# Patient Record
Sex: Male | Born: 1960
Health system: Southern US, Community
[De-identification: ages and names within clinical notes are randomized; demographics above are authoritative.]

## PROBLEM LIST (undated history)

## (undated) DIAGNOSIS — R7881 Bacteremia: Secondary | ICD-10-CM

## (undated) DIAGNOSIS — Z8489 Family history of other specified conditions: Secondary | ICD-10-CM

## (undated) DIAGNOSIS — F419 Anxiety disorder, unspecified: Secondary | ICD-10-CM

## (undated) DIAGNOSIS — R0600 Dyspnea, unspecified: Secondary | ICD-10-CM

## (undated) DIAGNOSIS — F32A Depression, unspecified: Secondary | ICD-10-CM

## (undated) DIAGNOSIS — A0472 Enterocolitis due to Clostridium difficile, not specified as recurrent: Secondary | ICD-10-CM

## (undated) DIAGNOSIS — B965 Pseudomonas (aeruginosa) (mallei) (pseudomallei) as the cause of diseases classified elsewhere: Secondary | ICD-10-CM

## (undated) DIAGNOSIS — Z923 Personal history of irradiation: Secondary | ICD-10-CM

## (undated) DIAGNOSIS — I011 Acute rheumatic endocarditis: Secondary | ICD-10-CM

## (undated) DIAGNOSIS — I480 Paroxysmal atrial fibrillation: Secondary | ICD-10-CM

## (undated) DIAGNOSIS — M1990 Unspecified osteoarthritis, unspecified site: Secondary | ICD-10-CM

## (undated) DIAGNOSIS — K219 Gastro-esophageal reflux disease without esophagitis: Secondary | ICD-10-CM

## (undated) DIAGNOSIS — C3490 Malignant neoplasm of unspecified part of unspecified bronchus or lung: Secondary | ICD-10-CM

## (undated) HISTORY — PX: HIP SURGERY: SHX245

## (undated) HISTORY — DX: Enterocolitis due to Clostridium difficile, not specified as recurrent: A04.72

## (undated) HISTORY — DX: Bacteremia: R78.81

## (undated) HISTORY — DX: Pseudomonas (aeruginosa) (mallei) (pseudomallei) as the cause of diseases classified elsewhere: B96.5

## (undated) HISTORY — DX: Paroxysmal atrial fibrillation: I48.0

---

## 2014-02-15 HISTORY — PX: CATARACT EXTRACTION: SUR2

## 2014-08-12 DIAGNOSIS — H2512 Age-related nuclear cataract, left eye: Secondary | ICD-10-CM | POA: Insufficient documentation

## 2014-11-08 DIAGNOSIS — F419 Anxiety disorder, unspecified: Secondary | ICD-10-CM

## 2014-11-08 DIAGNOSIS — Z8739 Personal history of other diseases of the musculoskeletal system and connective tissue: Secondary | ICD-10-CM | POA: Insufficient documentation

## 2014-11-08 DIAGNOSIS — G40A01 Absence epileptic syndrome, not intractable, with status epilepticus: Secondary | ICD-10-CM | POA: Insufficient documentation

## 2014-11-08 HISTORY — DX: Anxiety disorder, unspecified: F41.9

## 2014-11-19 DIAGNOSIS — Z961 Presence of intraocular lens: Secondary | ICD-10-CM | POA: Insufficient documentation

## 2016-12-29 ENCOUNTER — Other Ambulatory Visit: Payer: Self-pay | Admitting: Physician Assistant

## 2016-12-29 DIAGNOSIS — Z7952 Long term (current) use of systemic steroids: Secondary | ICD-10-CM

## 2018-02-15 DIAGNOSIS — U071 COVID-19: Secondary | ICD-10-CM

## 2018-02-15 HISTORY — DX: COVID-19: U07.1

## 2020-03-24 ENCOUNTER — Encounter: Payer: Self-pay | Admitting: Emergency Medicine

## 2020-03-24 ENCOUNTER — Telehealth: Payer: Self-pay | Admitting: Emergency Medicine

## 2020-03-24 ENCOUNTER — Other Ambulatory Visit (INDEPENDENT_AMBULATORY_CARE_PROVIDER_SITE_OTHER): Payer: 59

## 2020-03-24 ENCOUNTER — Ambulatory Visit: Payer: 59 | Admitting: Emergency Medicine

## 2020-03-24 ENCOUNTER — Institutional Professional Consult (permissible substitution): Payer: 59 | Admitting: Emergency Medicine

## 2020-03-24 ENCOUNTER — Other Ambulatory Visit: Payer: Self-pay

## 2020-03-24 DIAGNOSIS — J449 Chronic obstructive pulmonary disease, unspecified: Secondary | ICD-10-CM | POA: Insufficient documentation

## 2020-03-24 DIAGNOSIS — R0602 Shortness of breath: Secondary | ICD-10-CM | POA: Diagnosis not present

## 2020-03-24 DIAGNOSIS — R0600 Dyspnea, unspecified: Secondary | ICD-10-CM | POA: Insufficient documentation

## 2020-03-24 DIAGNOSIS — R918 Other nonspecific abnormal finding of lung field: Secondary | ICD-10-CM | POA: Diagnosis not present

## 2020-03-24 LAB — CBC WITH DIFFERENTIAL/PLATELET
Basophils Absolute: 0 10*3/uL (ref 0.0–0.1)
Basophils Relative: 1 % (ref 0.0–3.0)
Eosinophils Absolute: 0.4 10*3/uL (ref 0.0–0.7)
Eosinophils Relative: 9.3 % — ABNORMAL HIGH (ref 0.0–5.0)
HCT: 35 % — ABNORMAL LOW (ref 39.0–52.0)
Hemoglobin: 11.6 g/dL — ABNORMAL LOW (ref 13.0–17.0)
Lymphocytes Relative: 33.1 % (ref 12.0–46.0)
Lymphs Abs: 1.3 10*3/uL (ref 0.7–4.0)
MCHC: 33.3 g/dL (ref 30.0–36.0)
MCV: 82 fl (ref 78.0–100.0)
Monocytes Absolute: 0.7 10*3/uL (ref 0.1–1.0)
Monocytes Relative: 17.4 % — ABNORMAL HIGH (ref 3.0–12.0)
Neutro Abs: 1.6 10*3/uL (ref 1.4–7.7)
Neutrophils Relative %: 39.2 % — ABNORMAL LOW (ref 43.0–77.0)
Platelets: 293 10*3/uL (ref 150.0–400.0)
RBC: 4.26 Mil/uL (ref 4.22–5.81)
RDW: 15.8 % — ABNORMAL HIGH (ref 11.5–15.5)
WBC: 4 10*3/uL (ref 4.0–10.5)

## 2020-03-24 LAB — BASIC METABOLIC PANEL
BUN: 8 mg/dL (ref 6–23)
CO2: 28 mEq/L (ref 19–32)
Calcium: 8.8 mg/dL (ref 8.4–10.5)
Chloride: 100 mEq/L (ref 96–112)
Creatinine, Ser: 0.82 mg/dL (ref 0.40–1.50)
GFR: 96.75 mL/min (ref >60.00)
Glucose, Bld: 97 mg/dL (ref 70–99)
Potassium: 3.7 mEq/L (ref 3.5–5.1)
Sodium: 135 mEq/L (ref 135–145)

## 2020-03-24 LAB — PROTIME-INR
INR: 1.2 ratio — ABNORMAL HIGH (ref 0.8–1.0)
Prothrombin Time: 13.1 s (ref 9.6–13.1)

## 2020-03-24 NOTE — Telephone Encounter (Signed)
Spoke with [facility] and images will be pushed through on PACs. Dr. [name] notified. Nothing further needed at this time.

## 2020-03-24 NOTE — Telephone Encounter (Signed)
LVM for [place] in reference to disk copy of CT sent to Dr. [name]. If call is returned please obtain information on how to get disk sent to office. Will leave encounter open for follow up

## 2020-03-24 NOTE — Assessment & Plan Note (Signed)
7.2 cm right hilar mass with endobronchial involvement.  He had hemoptysis, dyspnea.  Probable primary lung cancer.  He needs bronchoscopy as soon as possible we will arrange for 03/27/2020.  He understands the plans.  Lab work today.  I will obtain copies of his imaging from [facility] in [place]

## 2020-03-24 NOTE — Assessment & Plan Note (Signed)
Likely due to COPD plus the right hilar mass.  He needs pulmonary function testing, probably bronchodilator therapy.  We will arrange for PFT.

## 2020-03-24 NOTE — H&P (View-Only) (Signed)
Subjective:    Patient ID: John [name], male    DOB: 05-24-1960, 60 y.o.   MRN: [id number]  HPI 60 yo former smoker (30 pack years) with a history of rheumatoid arthritis followed [facility] rheumatology and on Simponi, prednisone 5 mg daily, Arava. Hx of seizures in the past, formerly on dilantin, off this since his 27's.  He had COVID end of 2021, resulted in SOB. He is here to evaluate an abnormal CT chest.  He developed cough and hemoptysis beginning about 2.5 weeks ago.  He has slowly progressive dyspnea over a few years. He exerts, works on hoarse farm, having more trouble doing chores.   CXR was performed and then CT chest done on 03/11/2020.  I have the report available but not the films.  Note was made of a 7.2 cm mass contiguous with the right hilum and along the right bronchial tree lobulated and irregular with some associated cavitation.  There are numerous satellite nodules present.  There is occlusion of right upper lobe segmental bronchi consistent with endobronchial involvement.  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 Marital status: Married    Spouse name: Not on file  . Number of children: Not on file  . Years of education: Not on file  . Highest education level: Not on file  Occupational History  . Not on file  Tobacco Use  . Smoking status: Former Smoker    Packs/day: 1.00    Years: 30.00    Pack years: 30.00    Types: Cigarettes    Quit date: 2018    Years since quitting: 4.1  . Smokeless tobacco: Never Used  Substance and Sexual Activity  . Alcohol use: Not on file  . Drug use: Not on file  . Sexual activity: Not on file  Other Topics Concern  . Not on file  Social History  Narrative  . 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    Not on File   Outpatient Medications Prior to Visit  Medication Sig Dispense Refill  . leflunomide (ARAVA) 20 MG tablet Take 20 mg by mouth daily.    [name] PARoxetine (PAXIL) 10 MG tablet Take by mouth.    [name] PARoxetine (PAXIL) 10 MG tablet Take 10 mg by mouth daily.    [name] SIMPONI 50 MG/0.5ML SOAJ Inject into the skin.     No facility-administered medications prior to visit.         Objective:   Physical Exam Vitals:   03/24/20 1356  BP: 118/74  Pulse: 100  Temp: (!) 97.2 F (36.2 C)  SpO2: 98%  Weight: 238 lb (108 kg)  Height: 5\' 9"  (1.753 m)   Gen: Pleasant, well-nourished, in no distress,  normal affect  ENT: No lesions,  mouth clear,  oropharynx clear, no postnasal drip  Neck: No JVD, no stridor  Lungs: No use of accessory muscles, no crackles or wheezing on normal respiration, no wheeze on forced expiration  Cardiovascular: RRR, heart sounds normal, no murmur or gallops, no peripheral edema  Musculoskeletal: No deformities, no cyanosis or clubbing  Neuro: alert, awake, non focal  Skin: Warm, no lesions or rash      Assessment & Plan:  Mass of right lung 7.2 cm right hilar mass with endobronchial involvement.  He had hemoptysis, dyspnea.  Probable primary lung cancer.  He needs bronchoscopy as soon as possible we will arrange for 03/27/2020.  He understands the plans.  Lab work today.  I will obtain copies of his imaging from [facility] in [facility]  Dyspnea Likely due to COPD plus the right hilar mass.  He needs pulmonary function testing, probably  bronchodilator therapy.  We will arrange for PFT.  Baltazar Apo, MD, PhD 03/24/2020, 2:37 PM [place] [facility] [phone] or if no answer before 7:00PM call [phone] For any issues after 7:00PM please call eLink [phone]

## 2020-03-24 NOTE — Patient Instructions (Addendum)
We will arrange for bronchoscopy on 03/27/2020 at [facility].  You will be contacted with the specific instructions regarding the test.  You need a designated driver. Lab work today.  We will plan to perform pulmonary function testing at your next office visit to better evaluate your shortness of breath. Follow with Dr. [name] next available with full pulmonary function testing on the same day.

## 2020-03-24 NOTE — Progress Notes (Signed)
Subjective:    Patient ID: John [name], male    DOB: [birth year], 60 y.o.   MRN: [id number]  HPI 60 yo former smoker (30 pack years) with a history of rheumatoid arthritis followed [facility] rheumatology and on Simponi, prednisone 5 mg daily, Arava. Hx of seizures in the past, formerly on dilantin, off this since his 60's.  He had COVID end of 2021, resulted in SOB. He is here to evaluate an abnormal CT chest.  He developed cough and hemoptysis beginning about 2.5 weeks ago.  He has slowly progressive dyspnea over a few years. He exerts, works on hoarse farm, having more trouble doing chores.   CXR was performed and then CT chest done on 03/11/2020.  I have the report available but not the films.  Note was made of a 7.2 cm mass contiguous with the right hilum and along the right bronchial tree lobulated and irregular with some associated cavitation.  There are numerous satellite nodules present.  There is occlusion of right upper lobe segmental bronchi consistent with endobronchial involvement.  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 Marital status: Married    Spouse name: Not on file  . Number of children: Not on file  . Years of education: Not on file  . Highest education level: Not on file  Occupational History  . Not on file  Tobacco Use  . Smoking status: Former Smoker    Packs/day: 1.00    Years: 30.00    Pack years: 30.00    Types: Cigarettes    Quit date: 2018    Years since quitting: 4.1  . Smokeless tobacco: Never Used  Substance and Sexual Activity  . Alcohol use: Not on file  . Drug use: Not on file  . Sexual activity: Not on file  Other Topics Concern  . Not on file  Social History  Narrative  . 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    Not on File   Outpatient Medications Prior to Visit  Medication Sig Dispense Refill  . leflunomide (ARAVA) 20 MG tablet Take 20 mg by mouth daily.    [name] PARoxetine (PAXIL) 10 MG tablet Take by mouth.    [name] PARoxetine (PAXIL) 10 MG tablet Take 10 mg by mouth daily.    [name] SIMPONI 50 MG/0.5ML SOAJ Inject into the skin.     No facility-administered medications prior to visit.         Objective:   Physical Exam Vitals:   03/24/20 1356  BP: 118/74  Pulse: 100  Temp: (!) 97.2 F (36.2 C)  SpO2: 98%  Weight: 238 lb (108 kg)  Height: 5\' 9"  (1.753 m)   Gen: Pleasant, well-nourished, in no distress,  normal affect  ENT: No lesions,  mouth clear,  oropharynx clear, no postnasal drip  Neck: No JVD, no stridor  Lungs: No use of accessory muscles, no crackles or wheezing on normal respiration, no wheeze on forced expiration  Cardiovascular: RRR, heart sounds normal, no murmur or gallops, no peripheral edema  Musculoskeletal: No deformities, no cyanosis or clubbing  Neuro: alert, awake, non focal  Skin: Warm, no lesions or rash      Assessment & Plan:  Mass of right lung 7.2 cm right hilar mass with endobronchial involvement.  He had hemoptysis, dyspnea.  Probable primary lung cancer.  He needs bronchoscopy as soon as possible we will arrange for 03/27/2020.  He understands the plans.  Lab work today.  I will obtain copies of his imaging from [facility] in [facility]  Dyspnea Likely due to COPD plus the right hilar mass.  He needs pulmonary function testing, probably  bronchodilator therapy.  We will arrange for PFT.  Baltazar Apo, MD, PhD 03/24/2020, 2:37 PM [place] [facility] [phone] or if no answer before 7:00PM call [phone] For any issues after 7:00PM please call eLink [phone]

## 2020-03-25 ENCOUNTER — Encounter (HOSPITAL_COMMUNITY): Payer: Self-pay | Admitting: Emergency Medicine

## 2020-03-25 ENCOUNTER — Telehealth: Payer: Self-pay | Admitting: Emergency Medicine

## 2020-03-25 ENCOUNTER — Other Ambulatory Visit (HOSPITAL_COMMUNITY)
Admission: RE | Admit: 2020-03-25 | Discharge: 2020-03-25 | Disposition: A | Discharge status: Home / Self Care | Payer: 59 | Source: Ambulatory Visit | Attending: Emergency Medicine | Admitting: Emergency Medicine

## 2020-03-25 DIAGNOSIS — Z01812 Encounter for preprocedural laboratory examination: Secondary | ICD-10-CM | POA: Diagnosis present

## 2020-03-25 DIAGNOSIS — Z20822 Contact with and (suspected) exposure to covid-19: Secondary | ICD-10-CM | POA: Insufficient documentation

## 2020-03-25 LAB — SARS CORONAVIRUS 2 (TAT 6-24 HRS): SARS Coronavirus 2: NEGATIVE

## 2020-03-25 NOTE — Telephone Encounter (Signed)
Yesterday, I contacted the patient & scheduled a covid test for today, 03/25/20 at 8:55 am for the EBUS scheduled by Dr. [name] for 2/10.  I asked [facility] to obtain a PA if needed.

## 2020-03-25 NOTE — Progress Notes (Signed)
EKG: N/A CXR: N/A ECHO: denies Stress Test: denies Cardiac Cath: denies  Fasting Blood Sugar- n/a Checks Blood Sugar__na/_ times a day  OSA/CPAP:  No  ASA/Blood Thinners:  No  Covid test 03/25/20 negative  Anesthesia Review:  No  Patient denies shortness of breath, fever, cough, and chest pain at PAT appointment.  Patient verbalized understanding of instructions provided today at the PAT appointment.  Patient asked to review instructions at home and day of surgery.

## 2020-03-27 ENCOUNTER — Encounter (HOSPITAL_COMMUNITY)
Admission: RE | Disposition: A | Discharge status: Home / Self Care | Payer: Self-pay | Source: Home / Self Care | Attending: Emergency Medicine

## 2020-03-27 ENCOUNTER — Other Ambulatory Visit: Payer: Self-pay | Admitting: Emergency Medicine

## 2020-03-27 ENCOUNTER — Ambulatory Visit (HOSPITAL_COMMUNITY)
Admission: RE | Admit: 2020-03-27 | Discharge: 2020-03-27 | Disposition: A | Discharge status: Home / Self Care | Payer: 59 | Attending: Emergency Medicine | Admitting: Emergency Medicine

## 2020-03-27 ENCOUNTER — Other Ambulatory Visit: Payer: Self-pay

## 2020-03-27 ENCOUNTER — Encounter (HOSPITAL_COMMUNITY): Payer: Self-pay | Admitting: Emergency Medicine

## 2020-03-27 ENCOUNTER — Ambulatory Visit (HOSPITAL_COMMUNITY): Payer: 59 | Admitting: Anesthesiology

## 2020-03-27 DIAGNOSIS — C3411 Malignant neoplasm of upper lobe, right bronchus or lung: Secondary | ICD-10-CM | POA: Insufficient documentation

## 2020-03-27 DIAGNOSIS — Z87891 Personal history of nicotine dependence: Secondary | ICD-10-CM | POA: Insufficient documentation

## 2020-03-27 DIAGNOSIS — R918 Other nonspecific abnormal finding of lung field: Secondary | ICD-10-CM | POA: Diagnosis present

## 2020-03-27 DIAGNOSIS — M069 Rheumatoid arthritis, unspecified: Secondary | ICD-10-CM | POA: Diagnosis not present

## 2020-03-27 DIAGNOSIS — Z79899 Other long term (current) drug therapy: Secondary | ICD-10-CM | POA: Diagnosis not present

## 2020-03-27 DIAGNOSIS — Z8249 Family history of ischemic heart disease and other diseases of the circulatory system: Secondary | ICD-10-CM | POA: Insufficient documentation

## 2020-03-27 DIAGNOSIS — Z8616 Personal history of COVID-19: Secondary | ICD-10-CM | POA: Diagnosis not present

## 2020-03-27 HISTORY — PX: VIDEO BRONCHOSCOPY WITH ENDOBRONCHIAL ULTRASOUND: SHX6177

## 2020-03-27 HISTORY — PX: BRONCHIAL BRUSHINGS: SHX5108

## 2020-03-27 HISTORY — DX: Family history of other specified conditions: Z84.89

## 2020-03-27 HISTORY — DX: Gastro-esophageal reflux disease without esophagitis: K21.9

## 2020-03-27 HISTORY — PX: FINE NEEDLE ASPIRATION: SHX6590

## 2020-03-27 HISTORY — DX: Dyspnea, unspecified: R06.00

## 2020-03-27 SURGERY — BRONCHOSCOPY, WITH EBUS
Anesthesia: General

## 2020-03-27 MED ORDER — CHLORHEXIDINE GLUCONATE 0.12 % MT SOLN
15.0000 mL | Freq: Once | OROMUCOSAL | Status: AC
  Filled 2020-03-27: qty 15

## 2020-03-27 MED ORDER — EPINEPHRINE 1 MG/10ML IJ SOSY
PREFILLED_SYRINGE | INTRAMUSCULAR | Status: AC
  Filled 2020-03-27: qty 10

## 2020-03-27 MED ORDER — ONDANSETRON HCL 4 MG/2ML IJ SOLN
INTRAMUSCULAR | Status: DC | PRN
  Administered 2020-03-27: 4 mg via INTRAVENOUS

## 2020-03-27 MED ORDER — SUGAMMADEX SODIUM 200 MG/2ML IV SOLN
INTRAVENOUS | Status: DC | PRN
  Administered 2020-03-27: 400 mg via INTRAVENOUS

## 2020-03-27 MED ORDER — LACTATED RINGERS IV SOLN: INTRAVENOUS | Status: DC

## 2020-03-27 MED ORDER — CHLORHEXIDINE GLUCONATE 0.12 % MT SOLN
OROMUCOSAL | Status: AC
  Administered 2020-03-27: 15 mL via OROMUCOSAL
  Filled 2020-03-27: qty 15

## 2020-03-27 MED ORDER — PROPOFOL 10 MG/ML IV BOLUS
INTRAVENOUS | Status: DC | PRN
  Administered 2020-03-27: 200 mg via INTRAVENOUS

## 2020-03-27 MED ORDER — ESMOLOL HCL 100 MG/10ML IV SOLN
INTRAVENOUS | Status: DC | PRN
  Administered 2020-03-27: 30 mg via INTRAVENOUS
  Administered 2020-03-27: 40 mg via INTRAVENOUS
  Administered 2020-03-27: 30 mg via INTRAVENOUS

## 2020-03-27 MED ORDER — ROCURONIUM BROMIDE 10 MG/ML (PF) SYRINGE
PREFILLED_SYRINGE | INTRAVENOUS | Status: DC | PRN
  Administered 2020-03-27: 100 mg via INTRAVENOUS

## 2020-03-27 MED ORDER — PHENYLEPHRINE HCL (PRESSORS) 10 MG/ML IV SOLN
INTRAVENOUS | Status: DC | PRN
  Administered 2020-03-27: 120 ug via INTRAVENOUS
  Administered 2020-03-27: 100 ug via INTRAVENOUS

## 2020-03-27 MED ORDER — SODIUM CHLORIDE (PF) 0.9 % IJ SOLN
PREFILLED_SYRINGE | INTRAMUSCULAR | Status: DC | PRN
  Administered 2020-03-27: 8 mL

## 2020-03-27 MED ORDER — LIDOCAINE 2% (20 MG/ML) 5 ML SYRINGE
INTRAMUSCULAR | Status: DC | PRN
  Administered 2020-03-27: 60 mg via INTRAVENOUS

## 2020-03-27 MED ORDER — DEXAMETHASONE SODIUM PHOSPHATE 10 MG/ML IJ SOLN
INTRAMUSCULAR | Status: DC | PRN
  Administered 2020-03-27: 10 mg via INTRAVENOUS

## 2020-03-27 MED ORDER — SODIUM CHLORIDE (PF) 0.9 % IJ SOLN: PREFILLED_SYRINGE | INTRAMUSCULAR | Status: DC | PRN

## 2020-03-27 MED ORDER — FENTANYL CITRATE (PF) 250 MCG/5ML IJ SOLN
INTRAMUSCULAR | Status: DC | PRN
  Administered 2020-03-27 (×2): 100 ug via INTRAVENOUS

## 2020-03-27 NOTE — Transfer of Care (Signed)
Immediate Anesthesia Transfer of Care Note  Patient: John [name]  Procedure(s) Performed: VIDEO BRONCHOSCOPY WITH ENDOBRONCHIAL ULTRASOUND (N/A ) FINE NEEDLE ASPIRATION BRONCHIAL BRUSHINGS  Patient Location: Endoscopy Unit  Anesthesia Type:General  Level of Consciousness: awake, alert  and oriented  Airway & Oxygen Therapy: Patient Spontanous Breathing and Patient connected to nasal cannula oxygen  Post-op Assessment: Report given to RN and Post -op Vital signs reviewed and stable  Post vital signs: Reviewed and stable  Last Vitals:  Vitals Value Taken Time  BP 151/82 03/27/20 1514  Temp    Pulse 107 03/27/20 1515  Resp 15 03/27/20 1515  SpO2 96 % 03/27/20 1515  Vitals shown include unvalidated device data.  Last Pain:  Vitals:   03/27/20 1513  TempSrc:   PainSc: 0-No pain      Patients Stated Pain Goal: 4 (35/59/74 1638)  Complications: No complications documented.

## 2020-03-27 NOTE — Op Note (Signed)
Video Bronchoscopy with Endobronchial Ultrasound Procedure Note  Date of Operation: 03/27/2020  Pre-op Diagnosis: Right upper lobe mass  Post-op Diagnosis: Same  Surgeon: Baltazar Apo  Assistants: None  Anesthesia: General endotracheal anesthesia  Operation: Flexible video fiberoptic bronchoscopy with endobronchial ultrasound and biopsies.  Estimated Blood Loss: 25 cc  Complications: None apparent  Indications and History: John [name] is a 60 y.o. male with history of tobacco use, RA on immunosuppression, seizures.  He had COVID in 2021.  CT chest to evaluate revealed a right hilar mass.  He presents today for tissue diagnosis, plan for bronchoscopy with EBUS.  The risks, benefits, complications, treatment options and expected outcomes were discussed with the patient.  The possibilities of pneumothorax, pneumonia, reaction to medication, pulmonary aspiration, perforation of a viscus, bleeding, failure to diagnose a condition and creating a complication requiring transfusion or operation were discussed with the patient who freely signed the consent.    Description of Procedure: The patient was examined in the preoperative area and history and data from the preprocedure consultation were reviewed. It was deemed appropriate to proceed.  The patient was taken to [facility] endoscopy room 2, identified as John [name] and the procedure verified as Flexible Video Fiberoptic Bronchoscopy.  A Time Out was held and the above information confirmed. After being taken to the operating room general anesthesia was initiated and the patient  was orally intubated. The video fiberoptic bronchoscope was introduced via the endotracheal tube and a general inspection was performed which showed normal airways on the left.  The main carina was sharp.  There was rounded hypervascular mass emanating from the posterior segment of the right upper lobe airway.  There was some exophytic necrotic looking material around  the edges of this lesion.  Endobronchial brushings, Wang needle biopsies, forceps biopsies were all performed in the posterior segment of the right upper lobe on this lesion to be sent for cytology and pathology.  The standard scope was then withdrawn and the endobronchial ultrasound was used to identify and characterize the peritracheal, hilar and bronchial lymph nodes. Inspection showed a single enlarged lymph node at station 10 R just adjacent to the right upper lobe mass itself.  This was hypervascular on Doppler ultrasound.  No samples were taken.  Inspection of the remaining tracheobronchial tree revealed no abnormal nodes on the left, no enlarged node at station 7.  There were 2 small nodes seen at station 4R.  These were normal in size and were too small to biopsy. The patient tolerated the procedure well without apparent complications. There was no significant blood loss. The bronchoscope was withdrawn. Anesthesia was reversed and the patient was taken to the PACU for recovery.   Samples: 1.  Endobronchial brushings from right upper lobe posterior segmental mass 2.  Endobronchial needle biopsies from right upper lobe posterior segmental mass 3.  Endobronchial forceps biopsies from right upper lobe posterior segmental mass   Plans:  The patient will be discharged from the PACU to home when recovered from anesthesia. We will review the cytology, pathology results with the patient when they become available. Outpatient followup will be with Dr. [name].   Baltazar Apo, MD, PhD 03/27/2020, 3:09 PM  [facility] [phone] or if no answer before 7:00PM call [phone] For any issues after 7:00PM please call eLink [phone]

## 2020-03-27 NOTE — Discharge Instructions (Signed)
Flexible Bronchoscopy  Flexible bronchoscopy is a procedure used to examine the passageways in the lungs. During the procedure, a thin, flexible tool with a camera (bronchoscope) is passed into the mouth or nose, down through the windpipe (trachea), and into the air tubes in the lungs (bronchi). This tool allows the health care provider to look inside the lungs and to take samples for testing, if needed. Tell a health care provider about:  Any allergies you have.  All medicines you are taking, including vitamins, herbs, eye drops, creams, and over-the-counter medicines.  Any problems you or family members have had with anesthetic medicines.  Any blood disorders you have.  Any surgeries you have had.  Any medical conditions you have.  Whether you are pregnant or may be pregnant. What are the risks? Generally, this is a safe procedure. However, problems may occur, including:  Infection.  Bleeding.  Damage to other structures or organs.  Allergic reactions to medicines.  Collapsed lung (pneumothorax).  Increased need for oxygen or difficulty breathing after the procedure. What happens before the procedure? Staying hydrated Follow instructions from your health care provider about hydration, which may include:  Up to 2 hours before the procedure - you may continue to drink clear liquids, such as water, clear fruit juice, black coffee, and plain tea.   Eating and drinking restrictions Follow instructions from your health care provider about eating and drinking, which may include:  8 hours before the procedure - stop eating heavy meals or foods, such as meat, fried foods, or fatty foods.  6 hours before the procedure - stop eating light meals or foods, such as toast or cereal.  6 hours before the procedure - stop drinking milk or drinks that contain milk.  2 hours before the procedure - stop drinking clear liquids. Medicines Ask your health care provider about:  Changing or  stopping your regular medicines. This is especially important if you are taking diabetes medicines or blood thinners.  Taking medicines such as aspirin and ibuprofen. These medicines can thin your blood. Do not take these medicines unless your health care provider tells you to take them.  Taking over-the-counter medicines, vitamins, herbs, and supplements. General instructions  You may be given antibiotic medicine to help lower the risk of infection.  Plan to have a responsible adult take you home from the hospital or clinic.  If you will be going home right after the procedure, plan to have a responsible adult care for you for the time you are told. This is important. What happens during the procedure?  An IV will be inserted into one of your veins.  You will be given a medicine (local anesthetic) to numb your mouth, nose, throat, and voice box (larynx). You may also be given one or more of the following: ? A medicine to help you relax (sedative). ? A medicine to control coughing. ? A medicine to dry up any fluids or secretions in your lungs.  A bronchoscope will be passed into your nose or mouth, and into your lungs. Your health care provider will examine your lungs.  Samples of airway secretions may be collected for testing.  If abnormal areas are seen in your airways, samples of tissue may be removed and checked under a microscope (biopsy).  If tissue samples are needed from the outer parts of the lung, a type of X-ray (fluoroscopy) may be used to guide the bronchoscope to these areas.  If bleeding occurs, you may be given medicine to  stop or decrease the bleeding. The procedure may vary among health care providers and hospitals. What can I expect after the procedure?  Your blood pressure, heart rate, breathing rate, and blood oxygen level will be monitored until you leave the hospital or clinic.  You may have a chest X-ray to check for signs of pneumothorax.  You willnot be  allowed to eat or drink anything for 2 hours after your procedure.  If a biopsy was taken, it is up to you to get the results of the test. Ask your health care provider, or the department that is doing the procedure, when your results will be ready.  You may have the following symptoms for 24-48 hours: ? A cough that is worse than it was before the procedure. ? A low-grade fever. ? A sore throat or hoarse voice. ? Some blood in the mucus from your lungs (sputum), if a biopsy was done. Follow these instructions at home: Eating and drinking  Do not eat or drink anything, including water, for 2 hours after your procedure, or until your numbing medicine has worn off. Having a numb throat increases your risk of burning yourself or choking.  Start eating soft foods and slowly drinking liquids after your numbness is gone and your cough and gag reflexes have returned.  You may return to your normal diet the day after the procedure. Driving  If you were given a sedative during the procedure, it can affect you for several hours. Do not drive or operate machinery until your health care provider says that it is safe.  Ask your health care provider if the medicine prescribed to you requires you to avoid driving or using machinery.  Return to your normal activities as told by your health care provider. Ask your health care provider what activities are safe for you. General instructions  Take over-the-counter and prescription medicines only as told by your health care provider.  Do not use any products that contain nicotine or tobacco. These products include cigarettes, chewing tobacco, and vaping devices, such as e-cigarettes. If you need help quitting, ask your health care provider.  Keep all follow-up visits. This is important.   Get help right away if:  You have shortness of breath that gets worse.  You become light-headed or feel like you might faint.  You have chest pain.  You cough up  more than a small amount of blood. These symptoms may represent a serious problem that is an emergency. Do not wait to see if the symptoms will go away. Get medical help right away. Call your local emergency services (911 in the [place] Do not drive yourself to the hospital. Summary  Flexible bronchoscopy is a procedure that allows your health care provider to look closely inside your lungs and to take testing samples if needed.  Risks of flexible bronchoscopy include bleeding, infection, and collapsed lung (pneumothorax).  Before the procedure, you will be given a medicine to numb your mouth, nose, throat, and voice box. Then, a bronchoscope will be passed into your nose or mouth, and into your lungs.  After the procedure, your blood pressure, heart rate, breathing rate, and blood oxygen level will be monitored until you leave the hospital or clinic. You may have a chest X-ray to check for signs of pneumothorax.  You will not be allowed to eat or drink anything for 2 hours after your procedure. This information is not intended to replace advice given to you by your health care provider.  Make sure you discuss any questions you have with your health care provider. Document Revised: 08/23/2019 Document Reviewed: 08/23/2019 Elsevier Patient Education  [place]. Lung Biopsy, Care After This information will help you take care of yourself after your procedure. Your health care provider may also give you more specific instructions depending on the type of biopsy you had. If you have problems or questions, contact your health care provider. What can I expect after the procedure? After the procedure, it is common to have:  A cough.  A sore throat.  Pain where a needle or incision was used to collect a biopsy sample (biopsy site). Follow these instructions at home: Medicines  Take over-the-counter and prescription medicines only as told by your health care provider.  Talk to your  health care provider before you take any medicines that contain aspirin or NSAIDS, such as ibuprofen. These medicines can increase your risk of bleeding.  Ask your health care provider if the medicine prescribed to you: ? Requires you to avoid driving or using machinery. ? Can cause constipation. You may need to take these actions to prevent or treat constipation:  Drink enough fluid to keep your urine pale yellow.  Take over-the-counter or prescription medicines.  Eat foods that are high in fiber, such as beans, whole grains, fresh fruits and vegetables.  Limit foods that are high in fat and processed sugars, such as fried or sweet foods. Biopsy site care  If you had a needle or open biopsy, follow instructions from your health care provider about how to take care of your biopsy site. Make sure you: ? Wash your hands with soap and water for at least 20 seconds before and after you change your bandage (dressing). If soap and water are not available, use hand sanitizer. ? Change your dressing as told by your health care provider. ? Leave stitches (sutures), skin glue, or adhesive strips in place for as long as you are told. If adhesive strip edges start to loosen and curl up, you may trim the loose edges. Do not remove adhesive strips completely unless your health care provider tells you to do that.  Do not take baths, swim, or use a hot tub until your health care provider approves. Ask your health care provider if you may take showers. You may only be allowed to take sponge baths.  Check your biopsy site every day for signs of infection. Check for: ? Redness, swelling, or more pain. ? Fluid or blood. ? Warmth. ? Pus or a bad smell.   General instructions  Do not drink alcohol if your health care provider tells you not to drink.  If you were given a sedative during the procedure, it can affect you for several hours. Do not drive or operate machinery until your health care provider says  that it is safe.  Return to your normal activities as told by your health care provider. Ask your health care provider what activities are safe for you.  It is up to you to get the results of your procedure. Ask your health care provider, or the department that is doing the procedure, when your results will be ready.  Keep all follow-up visits as told by your health care provider. This is important. Contact a health care provider if:  You have a fever.  You have redness, swelling, or more pain around your biopsy site.  You have fluid or blood coming from your biopsy site.  Your biopsy site feels warm  to the touch.  You have pus or a bad smell coming from your biopsy site.  You have pain that does not get better with medicine. Get help right away if:  You cough up blood.  You have trouble breathing.  You have chest pain.  You lose consciousness. These symptoms may represent a serious problem that is an emergency. Do not wait to see if the symptoms will go away. Get medical help right away. Call your local emergency services (911 in the [place] Do not drive yourself to the hospital. Summary  It is common to have some pain where a needle or incision was used to collect a biopsy sample (biopsy site).  Return to your normal activities as told by your health care provider. Ask your health care provider what activities are safe for you.  Take over-the-counter and prescription medicines only as told by your health care provider.  Report any unusual symptoms to your health care provider. This information is not intended to replace advice given to you by your health care provider. Make sure you discuss any questions you have with your health care provider. Document Revised: 01/13/2019 Document Reviewed: 01/13/2019 Elsevier Patient Education  2021 [place]. Flexible Bronchoscopy, Care After This sheet gives you information about how to care for yourself after your test. Your  doctor may also give you more specific instructions. If you have problems or questions, contact your doctor. Follow these instructions at home: Eating and drinking  Do not eat or drink anything (not even water) for 2 hours after your test, or until your numbing medicine (local anesthetic) wears off.  When your numbness is gone and your cough and gag reflexes have come back, you may: ? Eat only soft foods. ? Slowly drink liquids.  The day after the test, go back to your normal diet. Driving  Do not drive for 24 hours if you were given a medicine to help you relax (sedative).  Do not drive or use heavy machinery while taking prescription pain medicine. General instructions   Take over-the-counter and prescription medicines only as told by your doctor.  Return to your normal activities as told. Ask what activities are safe for you.  Do not use any products that have nicotine or tobacco in them. This includes cigarettes and e-cigarettes. If you need help quitting, ask your doctor.  Keep all follow-up visits as told by your doctor. This is important. It is very important if you had a tissue sample (biopsy) taken. Get help right away if:  You have shortness of breath that gets worse.  You get light-headed.  You feel like you are going to pass out (faint).  You have chest pain.  You cough up: ? More than a little blood. ? More blood than before. Summary  Do not eat or drink anything (not even water) for 2 hours after your test, or until your numbing medicine wears off.  Do not use cigarettes. Do not use e-cigarettes.  Get help right away if you have chest pain.   Please call our office for any questions or concerns. [phone].     This information is not intended to replace advice given to you by your health care provider. Make sure you discuss any questions you have with your health care provider. Document Released: 11/29/2008 Document Revised: 01/14/2017 Document  Reviewed: 02/20/2016 Elsevier Patient Education  2020 [employer].

## 2020-03-27 NOTE — Anesthesia Procedure Notes (Signed)
Procedure Name: Intubation Date/Time: 03/27/2020 2:14 PM Performed by: Babs Bertin, CRNA Pre-anesthesia Checklist: Patient identified, Emergency Drugs available, Suction available and Patient being monitored Patient Re-evaluated:Patient Re-evaluated prior to induction Oxygen Delivery Method: Circle System Utilized Preoxygenation: Pre-oxygenation with 100% oxygen Induction Type: IV induction Ventilation: Mask ventilation without difficulty Laryngoscope Size: Mac and 3 Grade View: Grade I Tube type: Oral Tube size: 8.5 mm Number of attempts: 1 Airway Equipment and Method: Stylet and Oral airway Placement Confirmation: ETT inserted through vocal cords under direct vision,  positive ETCO2 and breath sounds checked- equal and bilateral Secured at: 22 cm Tube secured with: Tape Dental Injury: Teeth and Oropharynx as per pre-operative assessment

## 2020-03-27 NOTE — Interval H&P Note (Signed)
History and Physical Interval Note:  03/27/2020 12:50 PM  John [name]  has presented today for surgery, with the diagnosis of right hilar mass.  The various methods of treatment have been discussed with the patient and family. After consideration of risks, benefits and other options for treatment, the patient has consented to  Procedure(s): [place] (N/A) as a surgical intervention.  The patient's history has been reviewed, patient examined, no change in status, stable for surgery.  I have reviewed the patient's chart and labs.  Questions were answered to the patient's satisfaction.     [name]

## 2020-03-27 NOTE — Anesthesia Preprocedure Evaluation (Addendum)
Anesthesia Evaluation  Patient identified by MRN, date of birth, ID band Patient awake    Reviewed: Allergy & Precautions, NPO status , Patient's Chart, lab work & pertinent test results  Airway Mallampati: II  TM Distance: >3 FB Neck ROM: Full    Dental  (+) Poor Dentition, Missing, Chipped, Dental Advisory Given, Loose,    Pulmonary former smoker,  Quit smoking 2018, 30 pack year history  R hilar mass   Pulmonary exam normal breath sounds clear to auscultation       Cardiovascular negative cardio ROS Normal cardiovascular exam Rhythm:Regular Rate:Normal     Neuro/Psych Seizures - (last one at 60yo), Well Controlled,  negative psych ROS   GI/Hepatic Neg liver ROS, GERD  Controlled and Medicated,  Endo/Other  Obesity BMI 35  Renal/GU negative Renal ROS  negative genitourinary   Musculoskeletal negative musculoskeletal ROS (+)   Abdominal (+) + obese,   Peds  Hematology negative hematology ROS (+) H/H 11.6/35   Anesthesia Other Findings   Reproductive/Obstetrics negative OB ROS                           Anesthesia Physical Anesthesia Plan  ASA: II  Anesthesia Plan: General   Post-op Pain Management:    Induction: Intravenous  PONV Risk Score and Plan: 2 and Ondansetron, Dexamethasone, Midazolam and Treatment may vary due to age or medical condition  Airway Management Planned: Oral ETT  Additional Equipment: None  Intra-op Plan:   Post-operative Plan: Extubation in OR  Informed Consent: I have reviewed the patients History and Physical, chart, labs and discussed the procedure including the risks, benefits and alternatives for the proposed anesthesia with the patient or authorized representative who has indicated his/her understanding and acceptance.     Dental advisory given  Plan Discussed with: CRNA  Anesthesia Plan Comments:        Anesthesia Quick Evaluation

## 2020-03-28 NOTE — Anesthesia Postprocedure Evaluation (Signed)
Anesthesia Post Note  Patient: [name]  Procedure(s) Performed: VIDEO BRONCHOSCOPY WITH ENDOBRONCHIAL ULTRASOUND (N/A ) FINE NEEDLE ASPIRATION BRONCHIAL BRUSHINGS     Patient location during evaluation: PACU Anesthesia Type: General Level of consciousness: awake and alert Pain management: pain level controlled Vital Signs Assessment: post-procedure vital signs reviewed and stable Respiratory status: spontaneous breathing, nonlabored ventilation, respiratory function stable and patient connected to nasal cannula oxygen Cardiovascular status: blood pressure returned to baseline and stable Postop Assessment: no apparent nausea or vomiting Anesthetic complications: no   No complications documented.  Last Vitals:  Vitals:   03/27/20 1520 03/27/20 1535  BP: 130/79 112/66  Pulse:  (!) 101  Resp:  20  Temp:    SpO2:  94%    Last Pain:  Vitals:   03/27/20 1535  TempSrc:   PainSc: 0-No pain                 Javione Gunawan S

## 2020-03-30 ENCOUNTER — Encounter (HOSPITAL_COMMUNITY): Payer: Self-pay | Admitting: Emergency Medicine

## 2020-03-31 LAB — SURGICAL PATHOLOGY

## 2020-03-31 LAB — CYTOLOGY - NON PAP

## 2020-03-31 NOTE — Telephone Encounter (Signed)
Checked for pft/bronch/ebus no John [name] was required for either of these [name] is aware Joellen Jersey

## 2020-04-01 ENCOUNTER — Telehealth: Payer: Self-pay | Admitting: Emergency Medicine

## 2020-04-01 DIAGNOSIS — C3490 Malignant neoplasm of unspecified part of unspecified bronchus or lung: Secondary | ICD-10-CM

## 2020-04-01 NOTE — Telephone Encounter (Signed)
I discussed w him by phone

## 2020-04-01 NOTE — Telephone Encounter (Signed)
I reviewed Bx results with the patient and his wife >> shows squamous cell lung CA.   Will refer to [facility], order his PET and MRI brain

## 2020-04-03 ENCOUNTER — Telehealth: Payer: Self-pay | Admitting: *Deleted

## 2020-04-03 ENCOUNTER — Encounter: Payer: Self-pay | Admitting: *Deleted

## 2020-04-03 DIAGNOSIS — R918 Other nonspecific abnormal finding of lung field: Secondary | ICD-10-CM

## 2020-04-03 NOTE — Telephone Encounter (Signed)
I called John [name] today.  I received referral on him and was checking his scans and pathology. His scans are not in the system.  I called patient to get an update on scans.  He states they were completed at [facility].  I called them to have them push scans but was unable to reach anyone. I did leave my name and phone number to call. Patient was thankful for the call. I did explain that I will have rad onc call him with an appt as well as med onc.  I will notify rad onc that I am trying to get his scans to [place].

## 2020-04-03 NOTE — Telephone Encounter (Signed)
I received a call back from [facility] and they are sending a CD of his scans.  I called John [name] to update him and give him an appt to see Dr. [name] next week.

## 2020-04-09 ENCOUNTER — Encounter: Payer: Self-pay | Admitting: *Deleted

## 2020-04-09 ENCOUNTER — Other Ambulatory Visit: Payer: Self-pay | Admitting: Medical Oncology

## 2020-04-09 DIAGNOSIS — R918 Other nonspecific abnormal finding of lung field: Secondary | ICD-10-CM

## 2020-04-09 NOTE — Progress Notes (Signed)
I have not received CD of Mr. Nay scans.  I called [facility] again and left a message for medical records to call me with an update.

## 2020-04-09 NOTE — Progress Notes (Signed)
I received a call back from [facility].  I was told that they sent the CD to the cancer center address on 2/17.  I will follow up with my manager to see where the mail is delivered to see if it has arrived.

## 2020-04-10 ENCOUNTER — Other Ambulatory Visit: Payer: Self-pay

## 2020-04-10 ENCOUNTER — Inpatient Hospital Stay: Payer: 59

## 2020-04-10 ENCOUNTER — Other Ambulatory Visit: Payer: Self-pay | Admitting: *Deleted

## 2020-04-10 ENCOUNTER — Encounter: Payer: Self-pay | Admitting: Emergency Medicine

## 2020-04-10 ENCOUNTER — Inpatient Hospital Stay: Payer: 59 | Attending: Internal Medicine | Admitting: Internal Medicine

## 2020-04-10 DIAGNOSIS — M069 Rheumatoid arthritis, unspecified: Secondary | ICD-10-CM | POA: Insufficient documentation

## 2020-04-10 DIAGNOSIS — R918 Other nonspecific abnormal finding of lung field: Secondary | ICD-10-CM

## 2020-04-10 DIAGNOSIS — Z87891 Personal history of nicotine dependence: Secondary | ICD-10-CM | POA: Diagnosis not present

## 2020-04-10 DIAGNOSIS — R059 Cough, unspecified: Secondary | ICD-10-CM | POA: Diagnosis not present

## 2020-04-10 DIAGNOSIS — R0789 Other chest pain: Secondary | ICD-10-CM | POA: Insufficient documentation

## 2020-04-10 DIAGNOSIS — K219 Gastro-esophageal reflux disease without esophagitis: Secondary | ICD-10-CM | POA: Diagnosis not present

## 2020-04-10 DIAGNOSIS — Z5111 Encounter for antineoplastic chemotherapy: Secondary | ICD-10-CM | POA: Insufficient documentation

## 2020-04-10 DIAGNOSIS — C3491 Malignant neoplasm of unspecified part of right bronchus or lung: Secondary | ICD-10-CM | POA: Insufficient documentation

## 2020-04-10 DIAGNOSIS — Z79899 Other long term (current) drug therapy: Secondary | ICD-10-CM | POA: Insufficient documentation

## 2020-04-10 LAB — CBC WITH DIFFERENTIAL (CANCER CENTER ONLY)
Abs Immature Granulocytes: 0.01 10*3/uL (ref 0.00–0.07)
Basophils Absolute: 0.1 10*3/uL (ref 0.0–0.1)
Basophils Relative: 1 %
Eosinophils Absolute: 0.2 10*3/uL (ref 0.0–0.5)
Eosinophils Relative: 6 %
HCT: 35.5 % — ABNORMAL LOW (ref 39.0–52.0)
Hemoglobin: 11.5 g/dL — ABNORMAL LOW (ref 13.0–17.0)
Immature Granulocytes: 0 %
Lymphocytes Relative: 30 %
Lymphs Abs: 1.1 10*3/uL (ref 0.7–4.0)
MCH: 27.1 pg (ref 26.0–34.0)
MCHC: 32.4 g/dL (ref 30.0–36.0)
MCV: 83.5 fL (ref 80.0–100.0)
Monocytes Absolute: 0.6 10*3/uL (ref 0.1–1.0)
Monocytes Relative: 16 %
Neutro Abs: 1.7 10*3/uL (ref 1.7–7.7)
Neutrophils Relative %: 47 %
Platelet Count: 233 10*3/uL (ref 150–400)
RBC: 4.25 MIL/uL (ref 4.22–5.81)
RDW: 14.9 % (ref 11.5–15.5)
WBC Count: 3.8 10*3/uL — ABNORMAL LOW (ref 4.0–10.5)
nRBC: 0 % (ref 0.0–0.2)

## 2020-04-10 LAB — CMP (CANCER CENTER ONLY)
ALT: 11 U/L (ref 0–44)
AST: 19 U/L (ref 15–41)
Albumin: 3.2 g/dL — ABNORMAL LOW (ref 3.5–5.0)
Alkaline Phosphatase: 119 U/L (ref 38–126)
Anion gap: 10 (ref 5–15)
BUN: 6 mg/dL (ref 6–20)
CO2: 25 mmol/L (ref 22–32)
Calcium: 8.7 mg/dL — ABNORMAL LOW (ref 8.9–10.3)
Chloride: 102 mmol/L (ref 98–111)
Creatinine: 0.85 mg/dL (ref 0.61–1.24)
GFR, Estimated: >60 mL/min (ref >60)
Glucose, Bld: 116 mg/dL — ABNORMAL HIGH (ref 70–99)
Potassium: 3.8 mmol/L (ref 3.5–5.1)
Sodium: 137 mmol/L (ref 135–145)
Total Bilirubin: 0.8 mg/dL (ref 0.3–1.2)
Total Protein: 7.4 g/dL (ref 6.5–8.1)

## 2020-04-10 NOTE — Research (Signed)
[id number] - Customer service manager for the Discovery and Validation of Biomarkers for the Prediction, Diagnosis, and Management of Disease  04/10/20  Consent:  Patient John [name] was identified by this research coordinator as a potential candidate for the above listed study.  This Clinical Research Coordinator met with Jamol Ginyard, [id number], on 04/10/20 in a manner and location that ensures patient privacy to discuss participation in the above listed research study.  Patient is accompanied by his wife.  A copy of the informed consent document and separate HIPAA Authorization was provided to the patient.  Patient reads, speaks, and understands [place].    Patient was provided with the business card of this and encouraged to contact the research team with any questions.  Patient was provided the option of taking informed consent documents home to review and was encouraged to review at their convenience with their support network, including other care providers. Patient is comfortable with making a decision regarding study participation today.  As outlined in the informed consent form, this coordinator and [name] discussed the purpose of the research study, the investigational nature of the study, study procedures and requirements for study participation, potential risks and benefits of study participation, as well as alternatives to participation.  The patient understands participation is voluntary and they may withdraw from study participation at any time.  This study does not involve randomization.  This study does not involve an investigational drug or device. This study does not involve a placebo. Patient understands enrollment is pending full eligibility review.   Confidentiality and how the patient's information will be used as part of study participation were discussed.  Patient was informed there is reimbursement provided for their time and effort spent on trial  participation in the form of a $50 visa gift card.  The patient is encouraged to discuss research study participation with their insurance provider to determine what costs they may incur as part of study participation, including research related injury.    All questions were answered to patient's satisfaction.  The informed consent and separate HIPAA Authorization was reviewed page by page.  The patient's mental and emotional status is appropriate to provide informed consent, and the patient verbalizes an understanding of study participation.  Patient has agreed to participate in the above listed research study and has voluntarily signed the informed consent version 2 and separate HIPAA Authorization, version 5 on 04/10/20 at 1457.  The patient was provided with a copy of the signed informed consent form and separate HIPAA Authorization for their reference.  No study specific procedures were obtained prior to the signing of the informed consent document.  Approximately 20 minutes were spent with the patient reviewing the informed consent documents.  Patient was not requested to complete a Release of Information form.   Eligibility: This coordinator has reviewed this patient's inclusion and exclusion criteria and confirmed John [name] is eligible for study participation.  Patient will continue with enrollment.  Plan:  Will plan to have research labs drawn at already scheduled lab appointment on 04/17/2020.   Clabe Seal Clinical Research Coordinator I  04/10/20 3:10 PM    [id number] - Customer service manager for the Discovery and Validation of Biomarkers for the Prediction, Diagnosis, and Management of Disease  This Nurse has reviewed this patient's inclusion and exclusion criteria as a second review and confirms John [name] is eligible for study participation.  Patient may continue with enrollment.  Dionne Bucy. Sharlett Iles, BSN, RN, CIC  04/10/2020 3:22 PM

## 2020-04-10 NOTE — Progress Notes (Signed)
err

## 2020-04-10 NOTE — Progress Notes (Signed)
The proposed treatment discussed in cancer conference is for discussion purpose only and is not a binding recommendation. The patient was not physically examined nor present for their treatment options. Therefore, final treatment plans cannot be decided.  ?

## 2020-04-10 NOTE — Progress Notes (Signed)
[place] [phone]   [phone]) [phone]  CONSULT NOTE  REFERRING PHYSICIAN: Dr. Baltazar Apo  REASON FOR CONSULTATION:  60 years old white male recently diagnosed with lung cancer  HPI John [name] is a 60 y.o. male with past medical history significant for GERD, rheumatoid arthritis as well as history of seizure treated with Dilantin in the past.  He also had COVID-19 infection in end of 2020.  The patient is currently followed by [facility] rheumatology and treated with prednisone 5 mg p.o. daily in addition to [place].  The patient mentioned that in early January 2022 he started having cough with occasional hemoptysis for around 3 weeks.  His shortness of breath was progressive and he had CT scan of the chest performed on March 11, 2020 and it showed 7.2 cm mass contiguous with the right hilum and along the right bronchial tree lobulated and irregular with some associated cavitation.  There are numerous satellite nodules present and there was occlusion of the right upper lobe segmental bronchi consistent with endobronchial involvement.  There was also a large right adrenal mass measuring 5.7 x 4.3 cm suspicious for metastatic disease.  The patient was referred to Dr. [name] and on March 27, 2020 he underwent video bronchoscopy with endobronchial ultrasound procedure.  The final ([id number]) from the endobronchial biopsy of the right upper lobe was consistent with squamous cell carcinoma. By immunohistochemistry, the neoplastic cells are positive for  cytokeratin 5/6 and p40 with patchy weak positivity for TTF-1. Based on the immunoprofile and morphology, the features are most consistent with a squamous cell carcinoma. The carcinoma appears moderately  Differentiated. The patient was referred to me today for evaluation and recommendation regarding management of his condition.  He is scheduled for MRI of the brain and a PET scan on April 15, 2020. When seen  today he continues to complain of pain and tightness in the chest as well as cough mainly dry but occasional productive.  He has no more hemoptysis.  He has shortness of breath at baseline increased with exertion.  He has no significant weight loss or night sweats.  He has no nausea, vomiting, diarrhea or constipation.  He has no headache or visual changes. Family history significant for mother and father with heart disease. The patient is married and has 1 son and stepdaughter.  He was accompanied today by his wife John [name].  He works as a Buyer, retail for [employer].  He currently works from home.  He has a history of smoking 1 pack/day for around 38 years and quit 6 years ago.  He has no history of alcohol or drug abuse except for remote history of marijuana use.  HPI  Past Medical History:  Diagnosis Date  . Dyspnea   . Family history of adverse reaction to anesthesia    brother with seizures had episode under anesthesia.  Patient has seizures as well.  John [name] GERD (gastroesophageal reflux disease)     Past Surgical History:  Procedure Laterality Date  . BRONCHIAL BRUSHINGS  03/27/2020   Procedure: BRONCHIAL BRUSHINGS;  Surgeon: [name], MD;  Location: [facility] ENDOSCOPY;  Service: Cardiopulmonary;;  . EYE SURGERY    . FINE NEEDLE ASPIRATION  03/27/2020   Procedure: FINE NEEDLE ASPIRATION;  Surgeon: [name], MD;  Location: [facility] ENDOSCOPY;  Service: Cardiopulmonary;;  . HIP SURGERY Left   . VIDEO BRONCHOSCOPY WITH ENDOBRONCHIAL ULTRASOUND N/A 03/27/2020   Procedure: VIDEO BRONCHOSCOPY WITH ENDOBRONCHIAL ULTRASOUND;  Surgeon: [name], MD;  Location: [facility] ENDOSCOPY;  Service: Cardiopulmonary;  Laterality: N/A;    No family history on file.  Social History Social History   Tobacco Use  . Smoking status: Former Smoker    Packs/day: 1.00    Years: 30.00    Pack years: 30.00    Types: Cigarettes    Quit date: 2018    Years since quitting: 4.1  . Smokeless tobacco:  Never Used  Vaping Use  . Vaping Use: Never used  Substance Use Topics  . Alcohol use: Not Currently  . Drug use: Never    No Known Allergies  Current Outpatient Medications  Medication Sig Dispense Refill  . leflunomide (ARAVA) 20 MG tablet Take 20 mg by mouth at bedtime.    John [name] PARoxetine (PAXIL) 10 MG tablet Take 10 mg by mouth daily.    John [name] PRILOSEC OTC 20 MG tablet Take 20 mg by mouth at bedtime.    John [name] SIMPONI 50 MG/0.5ML SOAJ Inject 50 mg into the skin every 30 (thirty) days.     No current facility-administered medications for this visit.    Review of Systems  Constitutional: negative Eyes: negative Ears, nose, mouth, throat, and face: negative Respiratory: positive for cough, dyspnea on exertion and pleurisy/chest pain Cardiovascular: negative Gastrointestinal: negative Genitourinary:negative Integument/breast: negative Hematologic/lymphatic: negative Musculoskeletal:negative Neurological: negative Behavioral/Psych: negative Endocrine: negative Allergic/Immunologic: negative  Physical Exam  KGU:RKYHC, healthy, no distress, well nourished, well developed and anxious SKIN: skin color, texture, turgor are normal, no rashes or significant lesions HEAD: Normocephalic, No masses, lesions, tenderness or abnormalities EYES: normal, PERRLA, Conjunctiva are pink and non-injected EARS: External ears normal, Canals clear OROPHARYNX:no exudate, no erythema and lips, buccal mucosa, and tongue normal  NECK: supple, no adenopathy, no JVD LYMPH:  no palpable lymphadenopathy, no hepatosplenomegaly LUNGS: clear to auscultation , and palpation HEART: regular rate & rhythm, no murmurs and no gallops ABDOMEN:abdomen soft, non-tender, normal bowel sounds and no masses or organomegaly BACK: No CVA tenderness, Range of motion is normal EXTREMITIES:no joint deformities, effusion, or inflammation, no edema  NEURO: alert & oriented x 3 with fluent speech, no focal motor/sensory  deficits  PERFORMANCE STATUS: ECOG 1  LABORATORY DATA: Lab Results  Component Value Date   WBC 3.8 (L) 04/10/2020   HGB 11.5 (L) 04/10/2020   HCT 35.5 (L) 04/10/2020   MCV 83.5 04/10/2020   PLT 233 04/10/2020      Chemistry      Component Value Date/Time   NA 135 03/24/2020 1506   K 3.7 03/24/2020 1506   CL 100 03/24/2020 1506   CO2 28 03/24/2020 1506   BUN 8 03/24/2020 1506   CREATININE 0.82 03/24/2020 1506      Component Value Date/Time   CALCIUM 8.8 03/24/2020 1506       RADIOGRAPHIC STUDIES: No results found.  ASSESSMENT: This is a very pleasant 59 years old white male recently diagnosed with stage IIIb/IV (T4, N2, M0/M1 C) non-small cell lung cancer, squamous cell carcinoma presented with large right upper lobe lung mass in addition to satellite nodule in the right upper lobe and few other subcentimeter nodules as well as right hilar and mediastinal lymphadenopathy and very suspicious right adrenal gland mass diagnosed in February 2022.  PLAN: I had a lengthy discussion with the patient and his wife today about his current disease stage, prognosis and treatment options.  I personally and independently reviewed the CT scan images and discussed the results with the patient and his  wife. I recommended for the patient to proceed with the complete staging work-up for his disease including a PET scan as well as MRI of the brain.  These are scheduled to be done on April 15, 2020. Depending on the hypermetabolic activity in the right adrenal gland, we may consider the patient for CT-guided core biopsy of this lesion to rule out metastatic disease. If the adrenal gland is negative, the patient may benefit from a course of concurrent chemoradiation with weekly carboplatin and paclitaxel. If the right adrenal gland lesion is suspicious or confirmed with a biopsy, the patient would be treated with a short course of palliative radiotherapy followed by systemic chemotherapy likely with  carboplatin and paclitaxel.  The decision regarding immunotherapy will be very challenging in his situation because of the active rheumatoid arthritis. He is scheduled to see Dr. [name] on April 17, 2020 and I will arrange for the patient to have a follow-up appointment with me that day for more detailed discussion of his treatment options after completion of the staging work-up. The patient and his wife had several questions and we answered them completely to their satisfaction. He was advised to call immediately if he has any concerning symptoms in the interval. The patient voices understanding of current disease status and treatment options and is in agreement with the current care plan.  All questions were answered. The patient knows to call the clinic with any problems, questions or concerns. We can certainly see the patient much sooner if necessary.  Thank you so much for allowing me to participate in the care of John [name]. I will continue to follow up the patient with you and assist in his care.  The total time spent in the appointment was 90 minutes.  Disclaimer: This note was dictated with voice recognition software. Similar sounding words can inadvertently be transcribed and may not be corrected upon review.   Eilleen Kempf April 10, 2020, 1:44 PM

## 2020-04-11 ENCOUNTER — Telehealth: Payer: Self-pay | Admitting: *Deleted

## 2020-04-11 ENCOUNTER — Encounter: Payer: Self-pay | Admitting: *Deleted

## 2020-04-11 ENCOUNTER — Telehealth: Payer: Self-pay | Admitting: Internal Medicine

## 2020-04-11 NOTE — Telephone Encounter (Signed)
PDL 1 sent 04/10/20

## 2020-04-11 NOTE — Telephone Encounter (Signed)
appts request in 2/24 los were already on pt's schedule. Made not other changes to pt's schedule.

## 2020-04-11 NOTE — Progress Notes (Signed)
  Oncology Nurse Navigator Documentation  Oncology Nurse Navigator Flowsheets 04/11/2020  Abnormal Finding Date 03/11/2020  Confirmed Diagnosis Date 03/27/2020  Diagnosis Status Pending Molecular Studies  Navigator Follow Up Date: 04/14/2020  Navigator Follow Up Reason: Appointment Review  Navigator Location [facility]  Referral Date to RadOnc/MedOnc 04/01/2020  Navigator Encounter Type Clinic/MDC;Initial MedOnc;Other:;Molecular Studies  Multidisiplinary Clinic Date 04/10/2020  Multidisiplinary Clinic Type Thoracic  Patient Visit Type Initial;MedOnc;Other  Treatment Phase Pre-Tx/Tx Discussion  Barriers/Navigation Needs Coordination of Care;Education/I met John and John [name] yesterday at thoracic clinic.  John [name] is a new patient of John [name] with stage III lung cancer.  I gave and explained information on dx and plan of care.  Per John [name] he requested I send tissue for PDL 1 testing. I updated pathology to send.  I was told that the tissue went out on 2/24 for PDL 1 test.  John [name] PET was scheduled for 3/1 but since yesterday has been cancelled due to authorization. I contacted John [name] to see how I can help get authorized. Wait for response.   Education Newly Diagnosed Cancer Education;Understanding Cancer/ Treatment Options;Other  Interventions Coordination of Care;Education;Psycho-Social Support;Other  Acuity Level 3-Moderate Needs (3-4 Barriers Identified)  Coordination of Care Other  Education Method Verbal;Written  Time Spent with Patient 60

## 2020-04-14 ENCOUNTER — Encounter: Payer: Self-pay | Admitting: Radiation Oncology

## 2020-04-14 ENCOUNTER — Telehealth: Payer: Self-pay | Admitting: Emergency Medicine

## 2020-04-14 ENCOUNTER — Telehealth: Payer: Self-pay | Admitting: Medical Oncology

## 2020-04-14 NOTE — Telephone Encounter (Signed)
Wife said his insurance will not pay for PET scan. I instructed wife to contact Dr Sudie Bailey office and ask who she should call re setting up payment out of pocket.

## 2020-04-14 NOTE — Telephone Encounter (Signed)
Called and spoke with pt's wife [name] who stated that insurance denied PET scan due to them stating it not being needed for treatment.  Pt was originally scheduled to have the PET tomorrow 3/1 but that has been cancelled due to insurance denying scan. [name] stated that the MRI was approved though so that scan is still on as scheduled.  Libby, please advise if you have received info from pt's insurance on the PET being denied.

## 2020-04-14 NOTE — Progress Notes (Signed)
Thoracic Location of Tumor / Histology: Right Lung  Patient had persistent cough with hemoptysis and shortness of breath that he sought intervention with a doctor.    Biopsies of right hilar mass on 03/27/2020     Tobacco/Marijuana/Snuff/ETOH use: no  Past/Anticipated interventions by cardiothoracic surgery, if any: 03/27/2020   Past/Anticipated interventions by medical oncology, if any: Per Dr Arbutus Ped "recommended for the patient to proceed with the complete staging work-up for his disease including a PET scan as well as MRI of the brain.  These are scheduled to be done on April 15, 2020. Depending on the hypermetabolic activity in the right adrenal gland, we may consider the patient for CT-guided core biopsy of this lesion to rule out metastatic disease. If the adrenal gland is negative, the patient may benefit from a course of concurrent chemoradiation with weekly carboplatin and paclitaxel. If the right adrenal gland lesion is suspicious or confirmed with a biopsy, the patient would be treated with a short course of palliative radiotherapy followed by systemic chemotherapy likely with carboplatin and paclitaxel.  The decision regarding immunotherapy will be very challenging in his situation because of the active rheumatoid arthritis.'  Signs/Symptoms  Weight changes, if any: none  Respiratory complaints, if any: shortness of breath  Hemoptysis, if any: yes  Pain issues, if any:  Chest tightness post biopsy  SAFETY ISSUES:  Prior radiation? no  Pacemaker/ICD? no   Possible current pregnancy? no  Is the patient on methotrexate? no  Current Complaints / other details:  Wife Karena Addison is with him today.  Patient and wife live alone with dogs and horses.  Patient is scheduled to see Oncology today.     Vitals:   04/17/20 1259  BP: 130/80  Pulse: 78  Resp: 18  Temp: (!) 97 F (36.1 C)  TempSrc: Temporal  SpO2: 97%  Weight: 234 lb 8 oz (106.4 kg)  Height: 5\' 9"  (1.753 m)

## 2020-04-15 ENCOUNTER — Other Ambulatory Visit: Payer: Self-pay

## 2020-04-15 ENCOUNTER — Ambulatory Visit (HOSPITAL_COMMUNITY)
Admission: RE | Admit: 2020-04-15 | Discharge: 2020-04-15 | Disposition: A | Discharge status: Home / Self Care | Payer: 59 | Source: Ambulatory Visit | Attending: Emergency Medicine | Admitting: Emergency Medicine

## 2020-04-15 ENCOUNTER — Other Ambulatory Visit: Payer: Self-pay | Admitting: Emergency Medicine

## 2020-04-15 ENCOUNTER — Ambulatory Visit (HOSPITAL_COMMUNITY): Admission: RE | Admit: 2020-04-15 | Payer: 59 | Source: Ambulatory Visit

## 2020-04-15 ENCOUNTER — Encounter: Payer: Self-pay | Admitting: *Deleted

## 2020-04-15 ENCOUNTER — Other Ambulatory Visit: Payer: Self-pay | Admitting: Medical Oncology

## 2020-04-15 DIAGNOSIS — C3490 Malignant neoplasm of unspecified part of unspecified bronchus or lung: Secondary | ICD-10-CM

## 2020-04-15 DIAGNOSIS — C3491 Malignant neoplasm of unspecified part of right bronchus or lung: Secondary | ICD-10-CM

## 2020-04-15 MED ORDER — GADOBUTROL 1 MMOL/ML IV SOLN
10.0000 mL | Freq: Once | INTRAVENOUS | Status: AC | PRN
  Administered 2020-04-15: 10 mL via INTRAVENOUS

## 2020-04-15 NOTE — Telephone Encounter (Signed)
I have placed an appeal fir the PET per Dr Bari Mantis request Joellen Jersey

## 2020-04-15 NOTE — Progress Notes (Signed)
I reached out to [facility] with Dr. Agustina Caroli office to get an update on authorization.  Wait for response.

## 2020-04-16 ENCOUNTER — Telehealth: Payer: Self-pay | Admitting: Emergency Medicine

## 2020-04-16 ENCOUNTER — Encounter: Payer: Self-pay | Admitting: *Deleted

## 2020-04-16 DIAGNOSIS — C3491 Malignant neoplasm of unspecified part of right bronchus or lung: Secondary | ICD-10-CM

## 2020-04-16 NOTE — Progress Notes (Unsigned)
[place] OFFICE PROGRESS NOTE  John Nian, MD [place] [address]  DIAGNOSIS: stage IIIb/IV (T4, N2, M0/M1 C) non-small cell lung cancer, squamous cell carcinoma presented with large right upper lobe lung mass in addition to satellite nodule in the right upper lobe and few other subcentimeter nodules as well as right hilar and mediastinal lymphadenopathy and very suspicious right adrenal gland mass diagnosed in February 2022.  PDL1: 70%  PRIOR THERAPY: None  CURRENT THERAPY: None  INTERVAL HISTORY: John [name] 60 y.o. male returns to the clinic today for a follow-up visit accompanied by his wife.  The patient was recently diagnosed with stage III/IV non-small cell lung cancer, squamous cell carcinoma.  Pending further staging work-up. The patient completed his staging brain MRI but his PET scan was not authorized any insurance and has not been performed at this time which would help determine the optimal treatment for this patient and confirm the stage of his disease.   The patient met with radiation oncology today. They would recommend 16 treatments with radiation treatment if he is found to have metastatic disease. If the mass on the adrenal gland is not consistent with metastatic disease, then they would recommend 30 treatments of radiation.  The patient is feeling similar today.  He denies any fever, chills, or weight loss. he has occasional night sweats "every so often" and is unsure if it is related to the sleeping temperature/environment or not.  His chest tightness is improved compared to prior. He continues to have a mild cough since 2020 and dyspnea on exertion which has been occurring the last 3-4 years. Denies any more hemoptysis. He denies any nausea, vomiting, diarrhea, or constipation.  He denies any headache or visual changes. He states sometimes the top of his head feels "sore". No overlying skin changes on his scalp. The patient is here  today for evaluation to review his brain MRI and for more detailed discussion about his current condition and recommended treatment options.   MEDICAL HISTORY: Past Medical History:  Diagnosis Date  . Dyspnea   . Family history of adverse reaction to anesthesia    brother with seizures had episode under anesthesia.  Patient has seizures as well.  [name] GERD (gastroesophageal reflux disease)   . Rheumatoid aortitis     ALLERGIES:  has No Known Allergies.  MEDICATIONS:  Current Outpatient Medications  Medication Sig Dispense Refill  . leflunomide (ARAVA) 20 MG tablet Take 20 mg by mouth at bedtime.    [name] PARoxetine (PAXIL) 10 MG tablet Take 10 mg by mouth daily.    [name] PRILOSEC OTC 20 MG tablet Take 20 mg by mouth at bedtime.    [name] SIMPONI 50 MG/0.5ML SOAJ Inject 50 mg into the skin every 30 (thirty) days.     No current facility-administered medications for this visit.    SURGICAL HISTORY:  Past Surgical History:  Procedure Laterality Date  . BRONCHIAL BRUSHINGS  03/27/2020   Procedure: BRONCHIAL BRUSHINGS;  Surgeon: [name], MD;  Location: [facility] ENDOSCOPY;  Service: Cardiopulmonary;;  . EYE SURGERY    . FINE NEEDLE ASPIRATION  03/27/2020   Procedure: FINE NEEDLE ASPIRATION;  Surgeon: [name], MD;  Location: [facility] ENDOSCOPY;  Service: Cardiopulmonary;;  . HIP SURGERY Left   . VIDEO BRONCHOSCOPY WITH ENDOBRONCHIAL ULTRASOUND N/A 03/27/2020   Procedure: VIDEO BRONCHOSCOPY WITH ENDOBRONCHIAL ULTRASOUND;  Surgeon: [name], MD;  Location: [place];  Service: Cardiopulmonary;  Laterality: N/A;  REVIEW OF SYSTEMS:   Review of Systems  Constitutional: Positive for occasional night sweats. Negative for appetite change, chills, fatigue, fever and unexpected weight change.  HENT: Negative for mouth sores, nosebleeds, sore throat and trouble swallowing.   Eyes: Negative for eye problems and icterus.  Respiratory: Positive for baseline cough and shortness of breath with  exertion. Negative for hemoptysis and wheezing.   Cardiovascular: Negative for chest pain and leg swelling.  Gastrointestinal: Negative for abdominal pain, constipation, diarrhea, nausea and vomiting.  Genitourinary: Negative for bladder incontinence, difficulty urinating, dysuria, frequency and hematuria.   Musculoskeletal: Negative for back pain, gait problem, neck pain and neck stiffness.  Skin: Negative for itching and rash.  Neurological: Positive for soreness on scalp. Negative for dizziness, extremity weakness, gait problem, headaches, light-headedness and seizures.  Hematological: Negative for adenopathy. Does not bruise/bleed easily.  Psychiatric/Behavioral: Negative for confusion, depression and sleep disturbance. The patient is not nervous/anxious.     PHYSICAL EXAMINATION:  Blood pressure 107/82, pulse 89, temperature 97.6 F (36.4 C), temperature source Tympanic, resp. rate 15, height 5' 11.5" (1.816 m), weight 235 lb 9.6 oz (106.9 kg), SpO2 98 %.  ECOG PERFORMANCE STATUS: 1 - Symptomatic but completely ambulatory  Physical Exam  Constitutional: Oriented to person, place, and time and well-developed, well-nourished, and in no distress.  HENT:  Head: Normocephalic and atraumatic.  Mouth/Throat: Oropharynx is clear and moist. No oropharyngeal exudate.  Eyes: Conjunctivae are normal. Right eye exhibits no discharge. Left eye exhibits no discharge. No scleral icterus.  Neck: Normal range of motion. Neck supple.  Cardiovascular: Normal rate, regular rhythm, normal heart sounds and intact distal pulses.   Pulmonary/Chest: Effort normal and breath sounds normal. No respiratory distress. No wheezes. No rales.  Abdominal: Soft. Bowel sounds are normal. Exhibits no distension and no mass. There is no tenderness.  Musculoskeletal: Normal range of motion. Exhibits no edema.  Lymphadenopathy:    No cervical adenopathy.  Neurological: Alert and oriented to person, place, and time.  Exhibits normal muscle tone. Gait normal. Coordination normal.  Skin: Skin is warm and dry. No rash noted. Not diaphoretic. No erythema. No pallor.  Psychiatric: Mood, memory and judgment normal.  Vitals reviewed.  LABORATORY DATA: Lab Results  Component Value Date   WBC 3.9 (L) 04/17/2020   HGB 11.7 (L) 04/17/2020   HCT 36.3 (L) 04/17/2020   MCV 83.4 04/17/2020   PLT 270 04/17/2020      Chemistry      Component Value Date/Time   NA 135 04/17/2020 1354   K 4.0 04/17/2020 1354   CL 101 04/17/2020 1354   CO2 25 04/17/2020 1354   BUN 7 04/17/2020 1354   CREATININE 0.90 04/17/2020 1354      Component Value Date/Time   CALCIUM 8.7 (L) 04/17/2020 1354   ALKPHOS 118 04/17/2020 1354   AST 16 04/17/2020 1354   ALT 11 04/17/2020 1354   BILITOT 0.8 04/17/2020 1354       RADIOGRAPHIC STUDIES:  DG Eye Foreign Body  Result Date: 04/15/2020 CLINICAL DATA:  Metal working/exposure; clearance prior to MRI EXAM: ORBITS FOR FOREIGN BODY - 2 VIEW COMPARISON:  None. FINDINGS: There is no evidence of metallic foreign body within the orbits. No significant bone abnormality identified. IMPRESSION: No evidence of metallic foreign body within the orbits. Electronically Signed   By: Rolm Baptise M.D.   On: 04/15/2020 12:05   MR BRAIN W WO CONTRAST  Result Date: 04/15/2020 CLINICAL DATA:  Non-small-cell lung cancer staging EXAM:  MRI HEAD WITHOUT AND WITH CONTRAST TECHNIQUE: Multiplanar, multiecho pulse sequences of the brain and surrounding structures were obtained without and with intravenous contrast. CONTRAST:  63m GADAVIST GADOBUTROL 1 MMOL/ML IV SOLN COMPARISON:  None. FINDINGS: Brain: No acute infarction, hemorrhage, hydrocephalus, extra-axial collection or mass lesion. Scattered small deep white matter hyperintensities bilaterally likely chronic microvascular ischemia. No enhancing metastatic deposits identified. Vascular: Normal arterial flow voids. Skull and upper cervical spine: No focal  skeletal lesion. Sinuses/Orbits: Mild mucosal edema paranasal sinuses. Bilateral cataract extraction. Other: None IMPRESSION: Negative for metastatic disease to the brain. Electronically Signed   By: CFranchot GalloM.D.   On: 04/15/2020 16:24     ASSESSMENT/PLAN:  This is a very pleasant 60year old white male recently diagnosed with stage IIIb/IV (T4, N2, M0/M1 C) non-small cell lung cancer, squamous cell carcinoma presented with large right upper lobe lung mass in addition to satellite nodule in the right upper lobe and few other subcentimeter nodules as well as right hilar and mediastinal lymphadenopathy and very suspicious right adrenal gland mass diagnosed in February 2022. His PDL1 expression is 70%  The patient was seen with Dr. MJulien Nordmanntoday. There have been challenging obtaining insurance authorization for his staging PET scan. His PET scan would help delineate whether his right adrenal mass is suspicious for metastatic disease or not, as well as confirm if there are other sites of metastatic disease. His brain MRI was negative for metastatic disease to the brain.   Dr. MJulien Nordmannwould like to move forward with a CT guided biopsy of the right adrenal mass. I have placed the order. If this is found to be consistent with metastatic disease, Dr. MJulien Nordmannwould recommend that the patient move forward with palliative radiotherapy to the lung mass followed by 6 cycles of full dose palliative chemotherapy with carboplatin for an AUC of 5 and paclitaxel 175 mg/m2. The patient is not a good candidate for maintenance immunotherapy given his active treatment for rheumatoid arthritis.   If the biopsy of the adrenal gland does not show evidence for metastatic disease, Dr. MJulien Nordmannwould recommend concurrent chemoradiation with weekly carboplatin for an AUC of 2 and paclitaxel 45 mg/m2 for 6-7 weeks.   I will arrange for a follow up visit in 10 days or so to review the results of the CT guided biopsy of the  right adrenal gland and for a more detailed discussion about his current condition and recommended treatment option.   We will continue to work on obtaining insurance authorization for the PET scan.   The patient was advised to call immediately if he has any concerning symptoms in the interval. The patient voices understanding of current disease status and treatment options and is in agreement with the current care plan. All questions were answered. The patient knows to call the clinic with any problems, questions or concerns. We can certainly see the patient much sooner if necessary       Orders Placed This Encounter  Procedures  . CT Biopsy    Unable to get authorization for PET scan so please do not wait for PET to be done as it may have to be cancelled.    Standing Status:   Future    Standing Expiration Date:   04/17/2021    Order Specific Question:   Lab orders requested (DO NOT place separate lab orders, these will be automatically ordered during procedure specimen collection):    Answer:   Surgical Pathology    Order Specific  Question:   Reason for Exam (SYMPTOM  OR DIAGNOSIS REQUIRED)    Answer:   Biopsy the adrenal gland to confirm whether this is stage IV/metastatic, squamous cell carcinoma. Unable to get authorization for PET scan    Order Specific Question:   Preferred location?    Answer:   [facility]     I spent 30-39 minutes in this encounter.   [name] [name], PA-C 04/17/20  ADDENDUM: Hematology/Oncology Attending: I had a face-to-face encounter with the patient today.  I reviewed his record and recommended his care plan.  This is a very pleasant 60 years old white male recently diagnosed with likely stage IV (T4, N2, M1 C) non-small cell lung cancer, squamous cell carcinoma with large right upper lobe lung mass in addition to a satellite nodule in the right upper lobe and few subcentimeter nodules as well as right hilar and mediastinal  lymphadenopathy and highly suspicious right adrenal metastasis diagnosed in February 2022.  He has PD-L1 expression of 70% but unfortunately the patient had severe rheumatoid arthritis and he may not be a great candidate for immunotherapy. He was supposed to have a PET scan before this visit but unfortunately because of insurance issues his PET scan is delayed and expected to be done on April 29, 2020. I discussed with the patient his option and treatment plans.  I recommended for him to proceed with CT-guided core biopsy of the right adrenal gland mass for confirmation of his diagnosis and staging work-up. If the patient has evidence of metastatic disease to the adrenal gland, he will benefit from a short course of palliative radiotherapy followed by systemic chemotherapy with likely carboplatin and paclitaxel.  I would not consider him for additional immunotherapy with this chemotherapy because of his severe rheumatoid arthritis. If the biopsy results from the right adrenal gland is negative for malignancy, he may benefit from a course of concurrent chemoradiation with weekly carboplatin and paclitaxel for 6-7 weeks. I will arrange for the patient to come back for follow-up visit in around 10 days for evaluation and discussion of his treatment options in more details based on the biopsy results. He can proceed with radiotherapy for now until the biopsy result is available and we will make adjustment for his treatment at that point. The patient was advised to call immediately if he has any other concerning symptoms in the interval.  The total time spent in the appointment was 45 minutes.  Disclaimer: This note was dictated with voice recognition software. Similar sounding words can inadvertently be transcribed and may be missed upon review. Eilleen Kempf, MD 04/17/20

## 2020-04-16 NOTE — Progress Notes (Signed)
I followed up on Mr. Radcliff PET authorization.  His scan ordered by Dr. [name] is not authorized.  I updated Dr. [name] and he would will order scan and to have Dr. Agustina Caroli office to cancel their PET.  I contacted Libby at Dr. Agustina Caroli office to cancel scan and re-ordered PET.

## 2020-04-16 NOTE — Telephone Encounter (Signed)
[id number] - Customer service manager for the Discovery and Validation of Biomarkers for the Prediction, Diagnosis, and Management of Disease  04/16/20  Called to confirm research labs draw at his scheduled lab appointment for tomorrow.  Patient is still interested in proceeding with research study.  Patient denied any questions at this time.  He was thanked for his time and participation.  Clabe Seal Clinical Research Coordinator I  04/16/20  10:48 AM

## 2020-04-17 ENCOUNTER — Ambulatory Visit: Payer: 59

## 2020-04-17 ENCOUNTER — Encounter: Payer: Self-pay | Admitting: Radiation Oncology

## 2020-04-17 ENCOUNTER — Other Ambulatory Visit: Payer: Self-pay

## 2020-04-17 ENCOUNTER — Ambulatory Visit: Payer: 59 | Admitting: Radiation Oncology

## 2020-04-17 ENCOUNTER — Inpatient Hospital Stay: Payer: 59 | Attending: Physician Assistant | Admitting: Physician Assistant

## 2020-04-17 ENCOUNTER — Ambulatory Visit
Admission: RE | Admit: 2020-04-17 | Discharge: 2020-04-17 | Disposition: A | Discharge status: Home / Self Care | Payer: 59 | Source: Ambulatory Visit | Attending: Radiation Oncology | Admitting: Radiation Oncology

## 2020-04-17 ENCOUNTER — Encounter: Payer: Self-pay | Admitting: Emergency Medicine

## 2020-04-17 ENCOUNTER — Inpatient Hospital Stay: Payer: 59

## 2020-04-17 VITALS — BP 107/82 | HR 89 | Temp 97.6°F | Resp 15 | Ht 71.5 in | Wt 235.6 lb

## 2020-04-17 DIAGNOSIS — Z5112 Encounter for antineoplastic immunotherapy: Secondary | ICD-10-CM | POA: Diagnosis present

## 2020-04-17 DIAGNOSIS — K219 Gastro-esophageal reflux disease without esophagitis: Secondary | ICD-10-CM | POA: Insufficient documentation

## 2020-04-17 DIAGNOSIS — C3491 Malignant neoplasm of unspecified part of right bronchus or lung: Secondary | ICD-10-CM | POA: Insufficient documentation

## 2020-04-17 DIAGNOSIS — R0602 Shortness of breath: Secondary | ICD-10-CM | POA: Insufficient documentation

## 2020-04-17 DIAGNOSIS — Z87891 Personal history of nicotine dependence: Secondary | ICD-10-CM | POA: Diagnosis not present

## 2020-04-17 DIAGNOSIS — I011 Acute rheumatic endocarditis: Secondary | ICD-10-CM | POA: Diagnosis not present

## 2020-04-17 DIAGNOSIS — R600 Localized edema: Secondary | ICD-10-CM | POA: Insufficient documentation

## 2020-04-17 DIAGNOSIS — Z5111 Encounter for antineoplastic chemotherapy: Secondary | ICD-10-CM | POA: Diagnosis present

## 2020-04-17 DIAGNOSIS — Z79899 Other long term (current) drug therapy: Secondary | ICD-10-CM | POA: Diagnosis not present

## 2020-04-17 DIAGNOSIS — R918 Other nonspecific abnormal finding of lung field: Secondary | ICD-10-CM

## 2020-04-17 DIAGNOSIS — C3411 Malignant neoplasm of upper lobe, right bronchus or lung: Secondary | ICD-10-CM | POA: Insufficient documentation

## 2020-04-17 HISTORY — DX: Acute rheumatic endocarditis: I01.1

## 2020-04-17 LAB — CMP (CANCER CENTER ONLY)
ALT: 11 U/L (ref 0–44)
AST: 16 U/L (ref 15–41)
Albumin: 3.3 g/dL — ABNORMAL LOW (ref 3.5–5.0)
Alkaline Phosphatase: 118 U/L (ref 38–126)
Anion gap: 9 (ref 5–15)
BUN: 7 mg/dL (ref 6–20)
CO2: 25 mmol/L (ref 22–32)
Calcium: 8.7 mg/dL — ABNORMAL LOW (ref 8.9–10.3)
Chloride: 101 mmol/L (ref 98–111)
Creatinine: 0.9 mg/dL (ref 0.61–1.24)
GFR, Estimated: >60 mL/min (ref >60)
Glucose, Bld: 95 mg/dL (ref 70–99)
Potassium: 4 mmol/L (ref 3.5–5.1)
Sodium: 135 mmol/L (ref 135–145)
Total Bilirubin: 0.8 mg/dL (ref 0.3–1.2)
Total Protein: 7.2 g/dL (ref 6.5–8.1)

## 2020-04-17 LAB — CBC WITH DIFFERENTIAL (CANCER CENTER ONLY)
Abs Immature Granulocytes: 0.01 10*3/uL (ref 0.00–0.07)
Basophils Absolute: 0 10*3/uL (ref 0.0–0.1)
Basophils Relative: 1 %
Eosinophils Absolute: 0.2 10*3/uL (ref 0.0–0.5)
Eosinophils Relative: 6 %
HCT: 36.3 % — ABNORMAL LOW (ref 39.0–52.0)
Hemoglobin: 11.7 g/dL — ABNORMAL LOW (ref 13.0–17.0)
Immature Granulocytes: 0 %
Lymphocytes Relative: 28 %
Lymphs Abs: 1.1 10*3/uL (ref 0.7–4.0)
MCH: 26.9 pg (ref 26.0–34.0)
MCHC: 32.2 g/dL (ref 30.0–36.0)
MCV: 83.4 fL (ref 80.0–100.0)
Monocytes Absolute: 0.7 10*3/uL (ref 0.1–1.0)
Monocytes Relative: 17 %
Neutro Abs: 1.8 10*3/uL (ref 1.7–7.7)
Neutrophils Relative %: 48 %
Platelet Count: 270 10*3/uL (ref 150–400)
RBC: 4.35 MIL/uL (ref 4.22–5.81)
RDW: 14.7 % (ref 11.5–15.5)
WBC Count: 3.9 10*3/uL — ABNORMAL LOW (ref 4.0–10.5)
nRBC: 0 % (ref 0.0–0.2)

## 2020-04-17 LAB — RESEARCH LABS

## 2020-04-17 NOTE — Progress Notes (Signed)
Radiation Oncology         ([phone]) [phone] ________________________________  Initial Outpatient Consultation  Name: John [name] MRN: [id number]  Date: 04/17/2020  DOB: 1960/08/14  PJ:KDTOIZTIW, John Rinks, MD  John Bears, MD   REFERRING PHYSICIAN: Curt Bears, MD  DIAGNOSIS: The encounter diagnosis was Stage 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 ([place]).  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 male who is seen as a courtesy of Dr. Julien [name] for an opinion concerning radiation therapy as part of management for his recently diagnosed lung cancer. Today, he is accompanied by his wife. The patient began to experience worsening shortness of breath, cough, and hemoptysis in January of 2022. Thus, he underwent a chest CT scan on 03/11/2020 that showed a 7.2 cm mass contiguous with the right hilum and along the right bronchial tree, lobulated and irregular with some associated cavitation. There were also noted to be numerous satellite nodules and occlusion of the right upper lobe segmental bronchi, consistent with endobronchial involvement. Additionally, there was a large right adrenal mass that measured 5.7 x 4.3 cm and was suspicious for metastatic disease.   Subsequently, the patient saw Dr. Lamonte [name], who performed a video bronchoscopy with endobronchial ultrasound on 03/27/2020. Pathology from the procedure revealed squamous cell carcinoma of the right upper lobe. Cytology revealed malignant cells consistent with squamous cell carcinoma oft he right upper lobe brushing and fine needle aspiration.   The patient was seen in consultation with Dr. Julien [name] on 04/10/2020, at which time it was recommended that staging workup be completed with PET scan and MRI of the brain. Depending on the hypermetabolic activity in the right adrenal gland, they discussed CT-guided core biopsy of the lesion to rule out  metastatic disease. If the adrenal gland is negative, then it was recommended that the patient proceed with concurrent chemoradiation with weekly Carboplatin and Paclitaxel. If the right adrenal gland lesion is suspicious or confirmed with a biopsy, then the patient would be treated with a short course of palliative radiotherapy followed by systemic chemotherapy likely with Carboplatin and Paclitaxel.  MRI of the brain on 04/15/2020 was negative for metastatic disease.  PREVIOUS RADIATION THERAPY: No  PAST MEDICAL HISTORY:  Past Medical History:  Diagnosis Date  . Dyspnea   . Family history of adverse reaction to anesthesia    brother with seizures had episode under anesthesia.  Patient has seizures as well.  [name] GERD (gastroesophageal reflux disease)   . Rheumatoid aortitis     PAST SURGICAL HISTORY: Past Surgical History:  Procedure Laterality Date  . BRONCHIAL BRUSHINGS  03/27/2020   Procedure: BRONCHIAL BRUSHINGS;  Surgeon: John Gobble, MD;  Location: [facility] ENDOSCOPY;  Service: Cardiopulmonary;;  . EYE SURGERY    . FINE NEEDLE ASPIRATION  03/27/2020   Procedure: FINE NEEDLE ASPIRATION;  Surgeon: John Gobble, MD;  Location: [facility] ENDOSCOPY;  Service: Cardiopulmonary;;  . HIP SURGERY Left   . VIDEO BRONCHOSCOPY WITH ENDOBRONCHIAL ULTRASOUND N/A 03/27/2020   Procedure: VIDEO BRONCHOSCOPY WITH ENDOBRONCHIAL ULTRASOUND;  Surgeon: John Gobble, MD;  Location: [facility];  Service: Cardiopulmonary;  Laterality: N/A;    FAMILY HISTORY: History reviewed. No pertinent family history.  SOCIAL HISTORY:  Social History   Tobacco Use  . Smoking status: Former Smoker    Packs/day: 1.00    Years: 30.00    Pack years: 30.00    Types: Cigarettes    Quit  date: 2018    Years since quitting: 4.1  . Smokeless tobacco: Never Used  Vaping Use  . Vaping Use: Never used  Substance Use Topics  . Alcohol use: Not Currently  . Drug use: Never    ALLERGIES: No Known  Allergies  MEDICATIONS:  Current Outpatient Medications  Medication Sig Dispense Refill  . leflunomide (ARAVA) 20 MG tablet Take 20 mg by mouth at bedtime.    [name] PARoxetine (PAXIL) 10 MG tablet Take 10 mg by mouth daily.    [name] PRILOSEC OTC 20 MG tablet Take 20 mg by mouth at bedtime.    [name] SIMPONI 50 MG/0.5ML SOAJ Inject 50 mg into the skin every 30 (thirty) days.     No current facility-administered medications for this encounter.    REVIEW OF SYSTEMS:  A 10+ POINT REVIEW OF SYSTEMS WAS OBTAINED including neurology, dermatology, psychiatry, cardiac, respiratory, lymph, extremities, GI, GU, musculoskeletal, constitutional, reproductive, HEENT.  He denies any significant hemoptysis at this time.  Patient denies any new headaches or new bony pain.   PHYSICAL EXAM:  height is 5\' 9"  (1.753 m) and weight is 234 lb 8 oz (106.4 kg). His temporal temperature is 97 F (36.1 C) (abnormal). His blood pressure is 130/80 and his pulse is 78. His respiration is 18 and oxygen saturation is 97%.   General: Alert and oriented, in no acute distress HEENT: Head is normocephalic. Extraocular movements are intact. Oropharynx is clear. Neck: Neck is supple, no palpable cervical or supraclavicular lymphadenopathy. Heart: Regular in rate and rhythm with no murmurs, rubs, or gallops. Chest: Clear to auscultation bilaterally, with no rhonchi, wheezes, or rales. Abdomen: Soft, nontender, nondistended, with no rigidity or guarding. Extremities: No cyanosis or edema. Lymphatics: see Neck Exam Skin: No concerning lesions. Musculoskeletal: symmetric strength and muscle tone throughout. Neurologic: Cranial nerves II through XII are grossly intact. No obvious focalities. Speech is fluent. Coordination is intact. Psychiatric: Judgment and insight are intact. Affect is appropriate.   ECOG = 1  0 - Asymptomatic (Fully active, able to carry on all predisease activities without restriction)  1 - Symptomatic but completely  ambulatory (Restricted in physically strenuous activity but ambulatory and able to carry out work of a light or sedentary nature. For example, light housework, office work)  2 - Symptomatic, <50% in bed during the day (Ambulatory and capable of all self care but unable to carry out any work activities. Up and about more than 50% of waking hours)  3 - Symptomatic, >50% in bed, but not bedbound (Capable of only limited self-care, confined to bed or chair 50% or more of waking hours)  4 - Bedbound (Completely disabled. Cannot carry on any self-care. Totally confined to bed or chair)  5 - Death   Eustace Pen MM, Creech RH, Tormey DC, et al. [phone]). "Toxicity and response criteria of the [facility]". [place] Oncol. 5 (6): 649-55  LABORATORY DATA:  Lab Results  Component Value Date   WBC 3.9 (L) 04/17/2020   HGB 11.7 (L) 04/17/2020   HCT 36.3 (L) 04/17/2020   MCV 83.4 04/17/2020   PLT 270 04/17/2020   NEUTROABS 1.8 04/17/2020   Lab Results  Component Value Date   NA 135 04/17/2020   K 4.0 04/17/2020   CL 101 04/17/2020   CO2 25 04/17/2020   GLUCOSE 95 04/17/2020   CREATININE 0.90 04/17/2020   CALCIUM 8.7 (L) 04/17/2020      RADIOGRAPHY: DG Eye Foreign Body  Result Date:  04/15/2020 CLINICAL DATA:  Metal working/exposure; clearance prior to MRI EXAM: ORBITS FOR FOREIGN BODY - 2 VIEW COMPARISON:  None. FINDINGS: There is no evidence of metallic foreign body within the orbits. No significant bone abnormality identified. IMPRESSION: No evidence of metallic foreign body within the orbits. Electronically Signed   By: Rolm Baptise M.D.   On: 04/15/2020 12:05   MR BRAIN W WO CONTRAST  Result Date: 04/15/2020 CLINICAL DATA:  Non-small-cell lung cancer staging EXAM: MRI HEAD WITHOUT AND WITH CONTRAST TECHNIQUE: Multiplanar, multiecho pulse sequences of the brain and surrounding structures were obtained without and with intravenous contrast. CONTRAST:  39mL GADAVIST  GADOBUTROL 1 MMOL/ML IV SOLN COMPARISON:  None. FINDINGS: Brain: No acute infarction, hemorrhage, hydrocephalus, extra-axial collection or mass lesion. Scattered small deep white matter hyperintensities bilaterally likely chronic microvascular ischemia. No enhancing metastatic deposits identified. Vascular: Normal arterial flow voids. Skull and upper cervical spine: No focal skeletal lesion. Sinuses/Orbits: Mild mucosal edema paranasal sinuses. Bilateral cataract extraction. Other: None IMPRESSION: Negative for metastatic disease to the brain. Electronically Signed   By: Franchot Gallo M.D.   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  His scans show occlusion of the segmental bronchi consistent with endobronchial component.  If no evidence of spread along the adrenal gland then the patient would be a good candidate for a more definitive course of radiation therapy over [redacted] weeks along with radiosensitizing chemotherapy.  Today, I talked to the patient and wife about the findings and work-up thus far.  We discussed the natural history of lung cancer and general treatment, highlighting the role of radiotherapy in the management.  We discussed the available radiation techniques, and focused on the details of logistics and delivery.  We reviewed the anticipated acute and late sequelae associated with radiation in this setting.  The patient was encouraged to ask questions that I answered to the best of my ability.  A patient consent form was discussed and signed.  We retained a copy for our records.  The patient would like to proceed with radiation and will be scheduled for CT simulation.  PLAN: PET scan is scheduled for 04/29/2020, however there appears to be some difficulties in having this approved by his insurance company.   This turns out to be the case then the patient will be set up for a CT-guided biopsy of his adrenal gland to complete issues concerning staging.  Meantime the patient will undergo CT simulation next week and be ready to start late next week with this treatment.  Anticipate either 2 to 3 weeks of radiation therapy is palliation or 6 weeks as a more definitive course of radiation therapy.  Total time spent in this encounter was 60 minutes which included reviewing the patient's most recent chest CT scan, MRI of brain, consultations, follow-ups, bronchoscopy, cytology/pathology reports, physical examination, and documentation.  ------------------------------------------------  Blair Promise, PhD, MD  This document serves as a record of services personally performed by [name], MD. It was created on his behalf by Clerance Lav, a trained medical scribe. The creation of this record is based on the scribe's personal observations and the provider's statements to them. This document has been checked and approved by the attending provider.

## 2020-04-17 NOTE — Progress Notes (Signed)
See md visit for nursing evaluation

## 2020-04-21 ENCOUNTER — Other Ambulatory Visit: Payer: Self-pay

## 2020-04-21 ENCOUNTER — Telehealth: Payer: Self-pay | Admitting: Physician Assistant

## 2020-04-21 ENCOUNTER — Ambulatory Visit
Admission: RE | Admit: 2020-04-21 | Discharge: 2020-04-21 | Disposition: A | Discharge status: Home / Self Care | Payer: 59 | Source: Ambulatory Visit | Attending: Radiation Oncology | Admitting: Radiation Oncology

## 2020-04-21 DIAGNOSIS — Z510 Encounter for antineoplastic radiation therapy: Secondary | ICD-10-CM | POA: Insufficient documentation

## 2020-04-21 DIAGNOSIS — Z87891 Personal history of nicotine dependence: Secondary | ICD-10-CM | POA: Insufficient documentation

## 2020-04-21 DIAGNOSIS — C3411 Malignant neoplasm of upper lobe, right bronchus or lung: Secondary | ICD-10-CM | POA: Diagnosis not present

## 2020-04-21 DIAGNOSIS — C3491 Malignant neoplasm of unspecified part of right bronchus or lung: Secondary | ICD-10-CM

## 2020-04-21 NOTE — Telephone Encounter (Signed)
Scheduled per los. Called and spoke with patient. Confirmed appt 

## 2020-04-22 NOTE — Telephone Encounter (Signed)
Looks like it has been resc and there is a auth attached to it so I would say this is resolved

## 2020-04-22 NOTE — Telephone Encounter (Signed)
Pt has been scheduled for PET scan under Norton Blizzard, RN. Please advise if this issue has been resolved. Thanks.

## 2020-04-23 ENCOUNTER — Encounter: Payer: Self-pay | Admitting: *Deleted

## 2020-04-23 DIAGNOSIS — Z510 Encounter for antineoplastic radiation therapy: Secondary | ICD-10-CM | POA: Diagnosis not present

## 2020-04-23 NOTE — Progress Notes (Signed)
I received a message from radiology and I called back. I spoke with Morey Hummingbird about getting Mr. Lantz's bx scheduled.  She states bx will not be scheduled until after PET per Dr. Laurence Ferrari.  I updated Dr. [name].

## 2020-04-23 NOTE — Progress Notes (Signed)
I called central scheduling to see if I could get John [name]'s bx schedule. I was unable to reach but did leave a message for them to call me.

## 2020-04-24 ENCOUNTER — Encounter: Payer: Self-pay | Admitting: *Deleted

## 2020-04-24 ENCOUNTER — Other Ambulatory Visit: Payer: Self-pay

## 2020-04-24 ENCOUNTER — Ambulatory Visit
Admission: RE | Admit: 2020-04-24 | Discharge: 2020-04-24 | Disposition: A | Discharge status: Home / Self Care | Payer: 59 | Source: Ambulatory Visit | Attending: Radiation Oncology | Admitting: Radiation Oncology

## 2020-04-24 ENCOUNTER — Other Ambulatory Visit (HOSPITAL_COMMUNITY): Payer: Self-pay | Admitting: Radiology

## 2020-04-24 DIAGNOSIS — Z510 Encounter for antineoplastic radiation therapy: Secondary | ICD-10-CM | POA: Diagnosis not present

## 2020-04-24 DIAGNOSIS — C3491 Malignant neoplasm of unspecified part of right bronchus or lung: Secondary | ICD-10-CM

## 2020-04-24 NOTE — Progress Notes (Signed)
I had John [name] discussed at cancer conference today to see if his bx could be scheduled soon.  IR Dr. Pascal Lux states yes this can be completed soon.  He will notify his scheduling team to get scheduled.

## 2020-04-25 ENCOUNTER — Other Ambulatory Visit: Payer: Self-pay

## 2020-04-25 ENCOUNTER — Telehealth (HOSPITAL_COMMUNITY): Payer: Self-pay

## 2020-04-25 ENCOUNTER — Encounter (HOSPITAL_COMMUNITY): Payer: Self-pay

## 2020-04-25 ENCOUNTER — Ambulatory Visit
Admission: RE | Admit: 2020-04-25 | Discharge: 2020-04-25 | Disposition: A | Discharge status: Home / Self Care | Payer: 59 | Source: Ambulatory Visit | Attending: Radiation Oncology | Admitting: Radiation Oncology

## 2020-04-25 DIAGNOSIS — Z510 Encounter for antineoplastic radiation therapy: Secondary | ICD-10-CM | POA: Diagnosis not present

## 2020-04-25 NOTE — Progress Notes (Unsigned)
DM     1        John [name] Male, 60 y.o., [birth year]  MRN:  [id number] Phone:  [phone] [name])       PCP:  [name], MD Coverage:  [place] [employer] Other  Next Appt With Radiology ([facility] PET) 04/29/2020 at 3:00 PM           RE: Biopsy Received: Yesterday  Message Details  Sandi Mariscal, MD  Fredrich Birks, April H; Cosgrove, Toni M; Feek, Loa Socks; 1 other The oncology team is having significant delay in getting the PET approved as (appropriately) requested by Dr. Bishop Dublin.   As the adrenal mass is either going to be a met (most likely) or a primary adrenal tumor and to help facilitate diagnosis, please schedule the pt ASAP for CT guided right adrenal mass Bx.   Chest CT - 03/11/20 - image 126, series 2.   Cathren Harsh     Previous Messages  ----- Message -----  From: Lenore Cordia  Sent: 04/24/2020  3:53 PM EST  To: Sandi Mariscal, MD, [name], [name], *  Subject: RE: Biopsy                    Sent for a review and this was the response  This message was also forwarded to [facility]. And I also spoke with her yesterday.  ----- Message -----  From: Criselda Peaches, MD  Sent: 04/23/2020 10:32 AM EST  To: Lenore Cordia  Subject: RE: Biopsy                    Please resubmit following PET CT on 3/15 so we can identify a target.   HKM    ----- Message -----  From: Lenore Cordia  Sent: 04/23/2020  9:59 AM EST  To: Ir Procedure Requests  Subject: Biopsy                      Procedure Requested: Biopsy the adrenal gland   Reason for Procedure: confirm whether this is stage IV/metastatic, squamous cell carcinoma.   Provider Requesting: [name], Tobe Sos  Provider Telephone:  [phone]    Other Info: Unable to get authorization for PET scan

## 2020-04-28 ENCOUNTER — Inpatient Hospital Stay: Payer: 59 | Admitting: Internal Medicine

## 2020-04-28 ENCOUNTER — Other Ambulatory Visit: Payer: Self-pay

## 2020-04-28 ENCOUNTER — Ambulatory Visit
Admission: RE | Admit: 2020-04-28 | Discharge: 2020-04-28 | Disposition: A | Discharge status: Home / Self Care | Payer: 59 | Source: Ambulatory Visit | Attending: Radiation Oncology | Admitting: Radiation Oncology

## 2020-04-28 ENCOUNTER — Encounter: Payer: Self-pay | Admitting: Internal Medicine

## 2020-04-28 VITALS — BP 118/85 | HR 99 | Temp 97.6°F | Resp 18 | Ht 71.5 in | Wt 234.7 lb

## 2020-04-28 DIAGNOSIS — Z510 Encounter for antineoplastic radiation therapy: Secondary | ICD-10-CM | POA: Diagnosis not present

## 2020-04-28 DIAGNOSIS — Z5112 Encounter for antineoplastic immunotherapy: Secondary | ICD-10-CM | POA: Diagnosis not present

## 2020-04-28 DIAGNOSIS — C3491 Malignant neoplasm of unspecified part of right bronchus or lung: Secondary | ICD-10-CM | POA: Diagnosis not present

## 2020-04-28 DIAGNOSIS — Z5111 Encounter for antineoplastic chemotherapy: Secondary | ICD-10-CM

## 2020-04-28 MED ORDER — PROCHLORPERAZINE MALEATE 10 MG PO TABS: 10.0000 mg | ORAL_TABLET | Freq: Four times a day (QID) | ORAL | 0 refills | Status: DC | PRN

## 2020-04-28 NOTE — Patient Instructions (Signed)
Pegfilgrastim injection What is this medicine? PEGFILGRASTIM (PEG fil gra stim) is a long-acting granulocyte colony-stimulating factor that stimulates the growth of neutrophils, a type of white blood cell important in the body's fight against infection. It is used to reduce the incidence of fever and infection in patients with certain types of cancer who are receiving chemotherapy that affects the bone marrow, and to increase survival after being exposed to high doses of radiation. This medicine may be used for other purposes; ask your health care provider or pharmacist if you have questions. COMMON BRAND NAME(S): Rexene Edison, Ziextenzo What should I tell my health care provider before I take this medicine? They need to know if you have any of these conditions:  kidney disease  latex allergy  ongoing radiation therapy  sickle cell disease  skin reactions to acrylic adhesives (On-Body Injector only)  an unusual or allergic reaction to pegfilgrastim, filgrastim, other medicines, foods, dyes, or preservatives  pregnant or trying to get pregnant  breast-feeding How should I use this medicine? This medicine is for injection under the skin. If you get this medicine at home, you will be taught how to prepare and give the pre-filled syringe or how to use the On-body Injector. Refer to the patient Instructions for Use for detailed instructions. Use exactly as directed. Tell your healthcare provider immediately if you suspect that the On-body Injector may not have performed as intended or if you suspect the use of the On-body Injector resulted in a missed or partial dose. It is important that you put your used needles and syringes in a special sharps container. Do not put them in a trash can. If you do not have a sharps container, call your pharmacist or healthcare provider to get one. Talk to your pediatrician regarding the use of this medicine in children. While this drug  may be prescribed for selected conditions, precautions do apply. Overdosage: If you think you have taken too much of this medicine contact a poison control center or emergency room at once. NOTE: This medicine is only for you. Do not share this medicine with others. What if I miss a dose? It is important not to miss your dose. Call your doctor or health care professional if you miss your dose. If you miss a dose due to an On-body Injector failure or leakage, a new dose should be administered as soon as possible using a single prefilled syringe for manual use. What may interact with this medicine? Interactions have not been studied. This list may not describe all possible interactions. Give your health care provider a list of all the medicines, herbs, non-prescription drugs, or dietary supplements you use. Also tell them if you smoke, drink alcohol, or use illegal drugs. Some items may interact with your medicine. What should I watch for while using this medicine? Your condition will be monitored carefully while you are receiving this medicine. You may need blood work done while you are taking this medicine. Talk to your health care provider about your risk of cancer. You may be more at risk for certain types of cancer if you take this medicine. If you are going to need a MRI, CT scan, or other procedure, tell your doctor that you are using this medicine (On-Body Injector only). What side effects may I notice from receiving this medicine? Side effects that you should report to your doctor or health care professional as soon as possible:  allergic reactions (skin rash, itching or hives, swelling of  the face, lips, or tongue)  back pain  dizziness  fever  pain, redness, or irritation at site where injected  pinpoint red spots on the skin  red or dark-brown urine  shortness of breath or breathing problems  stomach or side pain, or pain at the shoulder  swelling  tiredness  trouble  passing urine or change in the amount of urine  unusual bruising or bleeding Side effects that usually do not require medical attention (report to your doctor or health care professional if they continue or are bothersome):  bone pain  muscle pain This list may not describe all possible side effects. Call your doctor for medical advice about side effects. You may report side effects to FDA at 1-800-FDA-1088. Where should I keep my medicine? Keep out of the reach of children. If you are using this medicine at home, you will be instructed on how to store it. Throw away any unused medicine after the expiration date on the label. NOTE: This sheet is a summary. It may not cover all possible information. If you have questions about this medicine, talk to your doctor, pharmacist, or health care provider.  2021 Elsevier/Gold Standard (2019-02-23 13:20:51) Pembrolizumab injection What is this medicine? PEMBROLIZUMAB (pem broe liz ue mab) is a monoclonal antibody. It is used to treat certain types of cancer. This medicine may be used for other purposes; ask your health care provider or pharmacist if you have questions. COMMON BRAND NAME(S): Keytruda What should I tell my health care provider before I take this medicine? They need to know if you have any of these conditions:  autoimmune diseases like Crohn's disease, ulcerative colitis, or lupus  have had or planning to have an allogeneic stem cell transplant (uses someone else's stem cells)  history of organ transplant  history of chest radiation  nervous system problems like myasthenia gravis or Guillain-Barre syndrome  an unusual or allergic reaction to pembrolizumab, other medicines, foods, dyes, or preservatives  pregnant or trying to get pregnant  breast-feeding How should I use this medicine? This medicine is for infusion into a vein. It is given by a health care professional in a hospital or clinic setting. A special MedGuide  will be given to you before each treatment. Be sure to read this information carefully each time. Talk to your pediatrician regarding the use of this medicine in children. While this drug may be prescribed for children as young as 6 months for selected conditions, precautions do apply. Overdosage: If you think you have taken too much of this medicine contact a poison control center or emergency room at once. NOTE: This medicine is only for you. Do not share this medicine with others. What if I miss a dose? It is important not to miss your dose. Call your doctor or health care professional if you are unable to keep an appointment. What may interact with this medicine? Interactions have not been studied. This list may not describe all possible interactions. Give your health care provider a list of all the medicines, herbs, non-prescription drugs, or dietary supplements you use. Also tell them if you smoke, drink alcohol, or use illegal drugs. Some items may interact with your medicine. What should I watch for while using this medicine? Your condition will be monitored carefully while you are receiving this medicine. You may need blood work done while you are taking this medicine. Do not become pregnant while taking this medicine or for 4 months after stopping it. Women should inform their  doctor if they wish to become pregnant or think they might be pregnant. There is a potential for serious side effects to an unborn child. Talk to your health care professional or pharmacist for more information. Do not breast-feed an infant while taking this medicine or for 4 months after the last dose. What side effects may I notice from receiving this medicine? Side effects that you should report to your doctor or health care professional as soon as possible:  allergic reactions like skin rash, itching or hives, swelling of the face, lips, or tongue  bloody or black, tarry  breathing problems  changes in  vision  chest pain  chills  confusion  constipation  cough  diarrhea  dizziness or feeling faint or lightheaded  fast or irregular heartbeat  fever  flushing  joint pain  low blood counts - this medicine may decrease the number of white blood cells, red blood cells and platelets. You may be at increased risk for infections and bleeding.  muscle pain  muscle weakness  pain, tingling, numbness in the hands or feet  persistent headache  redness, blistering, peeling or loosening of the skin, including inside the mouth  signs and symptoms of high blood sugar such as dizziness; dry mouth; dry skin; fruity breath; nausea; stomach pain; increased hunger or thirst; increased urination  signs and symptoms of kidney injury like trouble passing urine or change in the amount of urine  signs and symptoms of liver injury like dark urine, light-colored stools, loss of appetite, nausea, right upper belly pain, yellowing of the eyes or skin  sweating  swollen lymph nodes  weight loss Side effects that usually do not require medical attention (report to your doctor or health care professional if they continue or are bothersome):  decreased appetite  hair loss  tiredness This list may not describe all possible side effects. Call your doctor for medical advice about side effects. You may report side effects to FDA at 1-800-FDA-1088. Where should I keep my medicine? This drug is given in a hospital or clinic and will not be stored at home. NOTE: This sheet is a summary. It may not cover all possible information. If you have questions about this medicine, talk to your doctor, pharmacist, or health care provider.  2021 Elsevier/Gold Standard (2019-01-03 21:44:53) Paclitaxel injection What is this medicine? PACLITAXEL (PAK li TAX el) is a chemotherapy drug. It targets fast dividing cells, like cancer cells, and causes these cells to die. This medicine is used to treat ovarian  cancer, breast cancer, lung cancer, Kaposi's sarcoma, and other cancers. This medicine may be used for other purposes; ask your health care provider or pharmacist if you have questions. COMMON BRAND NAME(S): Onxol, Taxol What should I tell my health care provider before I take this medicine? They need to know if you have any of these conditions:  history of irregular heartbeat  liver disease  low blood counts, like low white cell, platelet, or red cell counts  lung or breathing disease, like asthma  tingling of the fingers or toes, or other nerve disorder  an unusual or allergic reaction to paclitaxel, alcohol, polyoxyethylated castor oil, other chemotherapy, other medicines, foods, dyes, or preservatives  pregnant or trying to get pregnant  breast-feeding How should I use this medicine? This drug is given as an infusion into a vein. It is administered in a hospital or clinic by a specially trained health care professional. Talk to your pediatrician regarding the use of this medicine  in children. Special care may be needed. Overdosage: If you think you have taken too much of this medicine contact a poison control center or emergency room at once. NOTE: This medicine is only for you. Do not share this medicine with others. What if I miss a dose? It is important not to miss your dose. Call your doctor or health care professional if you are unable to keep an appointment. What may interact with this medicine? Do not take this medicine with any of the following medications:  live virus vaccines This medicine may also interact with the following medications:  antiviral medicines for hepatitis, HIV or AIDS  certain antibiotics like erythromycin and clarithromycin  certain medicines for fungal infections like ketoconazole and itraconazole  certain medicines for seizures like carbamazepine, phenobarbital, phenytoin  gemfibrozil  nefazodone  rifampin  St. John's wort This list  may not describe all possible interactions. Give your health care provider a list of all the medicines, herbs, non-prescription drugs, or dietary supplements you use. Also tell them if you smoke, drink alcohol, or use illegal drugs. Some items may interact with your medicine. What should I watch for while using this medicine? Your condition will be monitored carefully while you are receiving this medicine. You will need important blood work done while you are taking this medicine. This medicine can cause serious allergic reactions. To reduce your risk you will need to take other medicine(s) before treatment with this medicine. If you experience allergic reactions like skin rash, itching or hives, swelling of the face, lips, or tongue, tell your doctor or health care professional right away. In some cases, you may be given additional medicines to help with side effects. Follow all directions for their use. This drug may make you feel generally unwell. This is not uncommon, as chemotherapy can affect healthy cells as well as cancer cells. Report any side effects. Continue your course of treatment even though you feel ill unless your doctor tells you to stop. Call your doctor or health care professional for advice if you get a fever, chills or sore throat, or other symptoms of a cold or flu. Do not treat yourself. This drug decreases your body's ability to fight infections. Try to avoid being around people who are sick. This medicine may increase your risk to bruise or bleed. Call your doctor or health care professional if you notice any unusual bleeding. Be careful brushing and flossing your teeth or using a toothpick because you may get an infection or bleed more easily. If you have any dental work done, tell your dentist you are receiving this medicine. Avoid taking products that contain aspirin, acetaminophen, ibuprofen, naproxen, or ketoprofen unless instructed by your doctor. These medicines may hide a  fever. Do not become pregnant while taking this medicine. Women should inform their doctor if they wish to become pregnant or think they might be pregnant. There is a potential for serious side effects to an unborn child. Talk to your health care professional or pharmacist for more information. Do not breast-feed an infant while taking this medicine. Men are advised not to father a child while receiving this medicine. This product may contain alcohol. Ask your pharmacist or healthcare provider if this medicine contains alcohol. Be sure to tell all healthcare providers you are taking this medicine. Certain medicines, like metronidazole and disulfiram, can cause an unpleasant reaction when taken with alcohol. The reaction includes flushing, headache, nausea, vomiting, sweating, and increased thirst. The reaction can last from 30  minutes to several hours. What side effects may I notice from receiving this medicine? Side effects that you should report to your doctor or health care professional as soon as possible:  allergic reactions like skin rash, itching or hives, swelling of the face, lips, or tongue  breathing problems  changes in vision  fast, irregular heartbeat  high or low blood pressure  mouth sores  pain, tingling, numbness in the hands or feet  signs of decreased platelets or bleeding - bruising, pinpoint red spots on the skin, black, tarry stools, blood in the urine  signs of decreased red blood cells - unusually weak or tired, feeling faint or lightheaded, falls  signs of infection - fever or chills, cough, sore throat, pain or difficulty passing urine  signs and symptoms of liver injury like dark yellow or brown urine; general ill feeling or flu-like symptoms; light-colored stools; loss of appetite; nausea; right upper belly pain; unusually weak or tired; yellowing of the eyes or skin  swelling of the ankles, feet, hands  unusually slow heartbeat Side effects that usually  do not require medical attention (report to your doctor or health care professional if they continue or are bothersome):  diarrhea  hair loss  loss of appetite  muscle or joint pain  nausea, vomiting  pain, redness, or irritation at site where injected  tiredness This list may not describe all possible side effects. Call your doctor for medical advice about side effects. You may report side effects to FDA at 1-800-FDA-1088. Where should I keep my medicine? This drug is given in a hospital or clinic and will not be stored at home. NOTE: This sheet is a summary. It may not cover all possible information. If you have questions about this medicine, talk to your doctor, pharmacist, or health care provider.  2021 Elsevier/Gold Standard (2019-01-03 13:37:23) Carboplatin injection What is this medicine? CARBOPLATIN (KAR boe pla tin) is a chemotherapy drug. It targets fast dividing cells, like cancer cells, and causes these cells to die. This medicine is used to treat ovarian cancer and many other cancers. This medicine may be used for other purposes; ask your health care provider or pharmacist if you have questions. COMMON BRAND NAME(S): Paraplatin What should I tell my health care provider before I take this medicine? They need to know if you have any of these conditions:  blood disorders  hearing problems  kidney disease  recent or ongoing radiation therapy  an unusual or allergic reaction to carboplatin, cisplatin, other chemotherapy, other medicines, foods, dyes, or preservatives  pregnant or trying to get pregnant  breast-feeding How should I use this medicine? This drug is usually given as an infusion into a vein. It is administered in a hospital or clinic by a specially trained health care professional. Talk to your pediatrician regarding the use of this medicine in children. Special care may be needed. Overdosage: If you think you have taken too much of this medicine  contact a poison control center or emergency room at once. NOTE: This medicine is only for you. Do not share this medicine with others. What if I miss a dose? It is important not to miss a dose. Call your doctor or health care professional if you are unable to keep an appointment. What may interact with this medicine?  medicines for seizures  medicines to increase blood counts like filgrastim, pegfilgrastim, sargramostim  some antibiotics like amikacin, gentamicin, neomycin, streptomycin, tobramycin  vaccines Talk to your doctor or health care professional  before taking any of these medicines:  acetaminophen  aspirin  ibuprofen  ketoprofen  naproxen This list may not describe all possible interactions. Give your health care provider a list of all the medicines, herbs, non-prescription drugs, or dietary supplements you use. Also tell them if you smoke, drink alcohol, or use illegal drugs. Some items may interact with your medicine. What should I watch for while using this medicine? Your condition will be monitored carefully while you are receiving this medicine. You will need important blood work done while you are taking this medicine. This drug may make you feel generally unwell. This is not uncommon, as chemotherapy can affect healthy cells as well as cancer cells. Report any side effects. Continue your course of treatment even though you feel ill unless your doctor tells you to stop. In some cases, you may be given additional medicines to help with side effects. Follow all directions for their use. Call your doctor or health care professional for advice if you get a fever, chills or sore throat, or other symptoms of a cold or flu. Do not treat yourself. This drug decreases your body's ability to fight infections. Try to avoid being around people who are sick. This medicine may increase your risk to bruise or bleed. Call your doctor or health care professional if you notice any  unusual bleeding. Be careful brushing and flossing your teeth or using a toothpick because you may get an infection or bleed more easily. If you have any dental work done, tell your dentist you are receiving this medicine. Avoid taking products that contain aspirin, acetaminophen, ibuprofen, naproxen, or ketoprofen unless instructed by your doctor. These medicines may hide a fever. Do not become pregnant while taking this medicine. Women should inform their doctor if they wish to become pregnant or think they might be pregnant. There is a potential for serious side effects to an unborn child. Talk to your health care professional or pharmacist for more information. Do not breast-feed an infant while taking this medicine. What side effects may I notice from receiving this medicine? Side effects that you should report to your doctor or health care professional as soon as possible:  allergic reactions like skin rash, itching or hives, swelling of the face, lips, or tongue  signs of infection - fever or chills, cough, sore throat, pain or difficulty passing urine  signs of decreased platelets or bleeding - bruising, pinpoint red spots on the skin, black, tarry stools, nosebleeds  signs of decreased red blood cells - unusually weak or tired, fainting spells, lightheadedness  breathing problems  changes in hearing  changes in vision  chest pain  high blood pressure  low blood counts - This drug may decrease the number of white blood cells, red blood cells and platelets. You may be at increased risk for infections and bleeding.  nausea and vomiting  pain, swelling, redness or irritation at the injection site  pain, tingling, numbness in the hands or feet  problems with balance, talking, walking  trouble passing urine or change in the amount of urine Side effects that usually do not require medical attention (report to your doctor or health care professional if they continue or are  bothersome):  hair loss  loss of appetite  metallic taste in the mouth or changes in taste This list may not describe all possible side effects. Call your doctor for medical advice about side effects. You may report side effects to FDA at 1-800-FDA-1088. Where should I keep  my medicine? This drug is given in a hospital or clinic and will not be stored at home. NOTE: This sheet is a summary. It may not cover all possible information. If you have questions about this medicine, talk to your doctor, pharmacist, or health care provider.  2021 Elsevier/Gold Standard (2007-05-09 14:38:05) Lung Cancer Lung cancer is an abnormal growth of cancerous cells that forms a mass (malignant tumor) in a lung. There are several types of lung cancer. The types are based on the appearance of the tumor cells. The two most common types are:  Non-small cell lung cancer. This type of lung cancer is the most common type. Non-small cell lung cancers include squamous cell carcinoma, adenocarcinoma, and large cell carcinoma.  Small cell lung cancer. In this type of lung cancer, abnormal cells are smaller than those of non-small cell lung cancer. Small cell lung cancer gets worse (progresses) faster than non-small cell lung cancer. What are the causes? The most common cause of lung cancer is smoking tobacco. The second most common cause is exposure to a chemical called radon. What increases the risk? You are more likely to develop this condition if:  You smoke tobacco.  You have been exposed to: ? Secondhand tobacco smoke. ? Radon gas. ? Uranium. ? Asbestos. ? Arsenic in drinking water. ? Air pollution.  You have a family or personal history of lung cancer.  You have had lung radiation therapy in the past.  You are older than age 88. What are the signs or symptoms? In the early stages, you may not have any symptoms. As the cancer progresses, symptoms may include:  A lasting cough, possibly with  blood.  Fatigue.  Unexplained weight loss.  Shortness of breath.  Loud breathing (wheezing).  Chest pain.  Loss of appetite. Symptoms of advanced lung cancer include:  Hoarseness.  Bone or joint pain.  Weakness.  Change in the structure of the fingernails (clubbing), so that the nail looks like an upside-down spoon.  Swelling of the face or arms.  Inability to move the face (paralysis).  Drooping eyelids. How is this diagnosed? This condition may be diagnosed based on:  Your symptoms and medical history.  A physical exam.  A chest X-ray.  A CT scan.  Blood tests.  Sputum tests.  Removal of a sample of lung tissue (lung biopsy) for testing. Your cancer will be assessed (staged) to determine how severe it is and how much it has spread (metastasized). How is this treated? Treatment depends on the type and stage of your cancer. Treatment may include one or more of the following:  Surgery to remove as much of the cancer as possible. Lymph nodes in the area may be removed and tested for cancer as well.  Medicines that kill cancer cells (chemotherapy).  High-energy rays that kill cancer cells (radiation therapy).  Targeted therapy. This targets specific parts of cancer cells and the area around them to block the growth and spread of the cancer. Targeted therapy can help limit the damage to healthy cells. Follow these instructions at home: Eating and drinking  Some of your treatments might affect your appetite. If you are having problems eating, or if you do not have an appetite, meet with a dietitian.  If you have side effects that affect your appetite, it may help to: ? Eat smaller meals and snacks often. ? Drink high-nutrition and high-calorie shakes or supplements. ? Eat bland and soft foods that are easy to eat. ? Avoid eating  foods that are hot, spicy, or hard to swallow. General instructions  Do not use any products that contain nicotine or tobacco,  such as cigarettes and e-cigarettes. If you need help quitting, ask your health care provider.  Do not drink alcohol.  If you are admitted to the hospital, make sure your cancer specialist (oncologist) is aware. Your cancer may affect your treatment for other conditions.  Take over-the-counter and prescription medicines only as told by your health care provider.  Consider joining a support group for people who have been diagnosed with lung cancer.  Work with your health care provider to manage any side effects of treatment.  Keep all follow-up visits as told by your health care provider. This is important.   Where to find more information  American Cancer Society: https://www.cancer.[place]): https://www.cancer.gov Contact a health care provider if you:  Lose weight without trying.  Have a persistent cough and wheezing.  Feel short of breath.  Get tired easily.  Have bone or joint pain.  Have difficulty swallowing.  Notice that your voice is changing or getting hoarse.  Have pain that does not get better with medicine. Get help right away if you:  Cough up blood.  Have new breathing problems.  Have chest pain.  Have a fever.  Have swelling in an ankle, leg, or arm, or the face or neck.  Have paralysis in your face.  Are very confused.  Have a drooping eyelid. Summary  Lung cancer is an abnormal growth of cancerous cells that forms a mass (malignant tumor) in a lung.  There are several types of lung cancer. The types are based on the appearance of the tumor cells. The two most common types are non-small cell and small cell.  The most common cause of lung cancer is smoking tobacco.  Early symptoms include a lasting cough, possibly with blood, and fatigue, unexplained weight loss, and shortness of breath.  After diagnosis, treatment depends on the type and stage of your cancer. This information is not intended to replace advice  given to you by your health care provider. Make sure you discuss any questions you have with your health care provider. Document Revised: 10/04/2019 Document Reviewed: 10/04/2019 Elsevier Patient Education  2021 [employer].

## 2020-04-28 NOTE — Progress Notes (Signed)
[facility] Telephone:([phone]) [phone]   Fax:(336) [phone]  OFFICE PROGRESS NOTE  John Nian, MD [place] [id number]  DIAGNOSIS: stage IIIb/IV (T4, N2, M0/M1 C)non-small cell lung cancer, squamous cell carcinoma presented with large right upper lobe lung mass in addition to satellite nodule in the right upper lobe and few other subcentimeter nodules as well as right hilar and mediastinal lymphadenopathy and very suspicious right adrenal gland mass diagnosed in February 2022.  PDL1: 70%  PRIOR THERAPY:  Palliative radiotherapy to the large right upper lobe lung mass and mediastinal lymphadenopathy under the care of Dr. [name]  CURRENT THERAPY:  Systemic chemotherapy with carboplatin for AUC of 5, paclitaxel 175 mg/M2 and Keytruda 200 mg IV every 3 weeks with Neulasta support.  First dose May 06, 2019.    INTERVAL HISTORY: John [name] 60 y.o. male returns to the clinic today for follow-up visit accompanied by his wife.  The patient is feeling fine today with no concerning complaints.  He denied having any current chest pain, shortness of breath except with exertion with mild cough and no hemoptysis.  He denied having any fever or chills.  He has no nausea, vomiting, diarrhea or constipation.  He has no headache or visual changes.  He continues to tolerate his palliative radiotherapy fairly well.  He is scheduled to have a PET scan tomorrow and CT guided biopsy of the right adrenal gland next week.  The patient is here today for evaluation and recommendation regarding his condition.  MEDICAL HISTORY: Past Medical History:  Diagnosis Date  . Dyspnea   . Family history of adverse reaction to anesthesia    brother with seizures had episode under anesthesia.  Patient has seizures as well.  John [name] GERD (gastroesophageal reflux disease)   . Rheumatoid aortitis     ALLERGIES:  has No Known Allergies.  MEDICATIONS:  Current Outpatient Medications   Medication Sig Dispense Refill  . leflunomide (ARAVA) 20 MG tablet Take 20 mg by mouth at bedtime.    John [name] PARoxetine (PAXIL) 10 MG tablet Take 10 mg by mouth daily.    John [name] PRILOSEC OTC 20 MG tablet Take 20 mg by mouth at bedtime.    John [name] SIMPONI 50 MG/0.5ML SOAJ Inject 50 mg into the skin every 30 (thirty) days.     No current facility-administered medications for this visit.    SURGICAL HISTORY:  Past Surgical History:  Procedure Laterality Date  . BRONCHIAL BRUSHINGS  03/27/2020   Procedure: BRONCHIAL BRUSHINGS;  Surgeon: [name], MD;  Location: [facility] ENDOSCOPY;  Service: Cardiopulmonary;;  . EYE SURGERY    . FINE NEEDLE ASPIRATION  03/27/2020   Procedure: FINE NEEDLE ASPIRATION;  Surgeon: [name], MD;  Location: [facility] ENDOSCOPY;  Service: Cardiopulmonary;;  . HIP SURGERY Left   . VIDEO BRONCHOSCOPY WITH ENDOBRONCHIAL ULTRASOUND N/A 03/27/2020   Procedure: VIDEO BRONCHOSCOPY WITH ENDOBRONCHIAL ULTRASOUND;  Surgeon: [name], MD;  Location: [place];  Service: Cardiopulmonary;  Laterality: N/A;    REVIEW OF SYSTEMS:  Constitutional: positive for fatigue Eyes: negative Ears, nose, mouth, throat, and face: negative Respiratory: positive for dyspnea on exertion Cardiovascular: negative Gastrointestinal: negative Genitourinary:negative Integument/breast: negative Hematologic/lymphatic: negative Musculoskeletal:negative Neurological: negative Behavioral/Psych: negative Endocrine: negative Allergic/Immunologic: negative   PHYSICAL EXAMINATION: General appearance: alert, cooperative, fatigued and no distress Head: Normocephalic, without obvious abnormality, atraumatic Neck: no adenopathy, no JVD, supple, symmetrical, trachea midline and thyroid not enlarged, symmetric, no tenderness/mass/nodules Lymph nodes: Cervical, supraclavicular,  and axillary nodes normal. Resp: clear to auscultation bilaterally Back: symmetric, no curvature. ROM normal. No CVA  tenderness. Cardio: regular rate and rhythm, S1, S2 normal, no murmur, click, rub or gallop GI: soft, non-tender; bowel sounds normal; no masses,  no organomegaly Extremities: extremities normal, atraumatic, no cyanosis or edema Neurologic: Alert and oriented X 3, normal strength and tone. Normal symmetric reflexes. Normal coordination and gait  ECOG PERFORMANCE STATUS: 1 - Symptomatic but completely ambulatory  Blood pressure 118/85, pulse 99, temperature 97.6 F (36.4 C), temperature source Tympanic, resp. rate 18, height 5' 11.5" (1.816 m), weight 234 lb 11.2 oz (106.5 kg), SpO2 100 %.  LABORATORY DATA: Lab Results  Component Value Date   WBC 3.9 (L) 04/17/2020   HGB 11.7 (L) 04/17/2020   HCT 36.3 (L) 04/17/2020   MCV 83.4 04/17/2020   PLT 270 04/17/2020      Chemistry      Component Value Date/Time   NA 135 04/17/2020 1354   K 4.0 04/17/2020 1354   CL 101 04/17/2020 1354   CO2 25 04/17/2020 1354   BUN 7 04/17/2020 1354   CREATININE 0.90 04/17/2020 1354      Component Value Date/Time   CALCIUM 8.7 (L) 04/17/2020 1354   ALKPHOS 118 04/17/2020 1354   AST 16 04/17/2020 1354   ALT 11 04/17/2020 1354   BILITOT 0.8 04/17/2020 1354       RADIOGRAPHIC STUDIES: DG Eye Foreign Body  Result Date: 04/15/2020 CLINICAL DATA:  Metal working/exposure; clearance prior to MRI EXAM: ORBITS FOR FOREIGN BODY - 2 VIEW COMPARISON:  None. FINDINGS: There is no evidence of metallic foreign body within the orbits. No significant bone abnormality identified. IMPRESSION: No evidence of metallic foreign body within the orbits. Electronically Signed   By: Rolm Baptise M.D.   On: 04/15/2020 12:05   MR BRAIN W WO CONTRAST  Result Date: 04/15/2020 CLINICAL DATA:  Non-small-cell lung cancer staging EXAM: MRI HEAD WITHOUT AND WITH CONTRAST TECHNIQUE: Multiplanar, multiecho pulse sequences of the brain and surrounding structures were obtained without and with intravenous contrast. CONTRAST:  15m  GADAVIST GADOBUTROL 1 MMOL/ML IV SOLN COMPARISON:  None. FINDINGS: Brain: No acute infarction, hemorrhage, hydrocephalus, extra-axial collection or mass lesion. Scattered small deep white matter hyperintensities bilaterally likely chronic microvascular ischemia. No enhancing metastatic deposits identified. Vascular: Normal arterial flow voids. Skull and upper cervical spine: No focal skeletal lesion. Sinuses/Orbits: Mild mucosal edema paranasal sinuses. Bilateral cataract extraction. Other: None IMPRESSION: Negative for metastatic disease to the brain. Electronically Signed   By: CFranchot GalloM.D.   On: 04/15/2020 16:24    ASSESSMENT AND PLAN: This is a very pleasant 60years old white male recently diagnosed with a stage IV (T4, N2, M1 C) non-small cell lung cancer, squamous cell carcinoma presented with large right upper lobe lung mass in addition to a satellite nodule in the right upper lobe and few other subcentimeter nodules in addition to right hilar and mediastinal lymphadenopathy and suspicious right adrenal gland mass diagnosed in February 2022.  He has PD-L1 expression of 70%. The patient is currently undergoing palliative radiotherapy to the large right upper lobe lung mass and mediastinal lymphadenopathy. He is scheduled to have a PET scan performed tomorrow and CT-guided core biopsy of the right adrenal gland next week. The patient has a highly suspicious stage IV taking into consideration the bilateral pulmonary nodule and the adrenal gland mass. I discussed with him his treatment options and I recommended for him systemic chemotherapy  with carboplatin for AUC of 5, paclitaxel 175 mg/M2 and Keytruda 200 mg IV every 3 weeks with Neulasta support.  At the PET scan or CT guided core biopsy of the adrenal gland were negative for metastatic disease, I will change his chemotherapy to weekly carboplatin for AUC of 2 and paclitaxel 45 mg/M2 concurrent with radiation for 6 weeks. I discussed with the  patient the adverse effect of this treatment including but not limited to alopecia, myelosuppression, nausea and vomiting, peripheral neuropathy, liver or renal dysfunction as well as immunotherapy adverse effects. The patient will have a chemotherapy education class before the first dose of his treatment. He is expected to start the first dose of this treatment next week. He will come back for follow-up visit in 2 weeks for evaluation and management of any adverse effect of his treatment. I will call his pharmacy with prescription for Compazine 10 mg p.o. every 6 hours as needed for nausea. The patient was advised to call immediately if he has any concerning symptoms in the interval. The patient voices understanding of current disease status and treatment options and is in agreement with the current care plan.  All questions were answered. The patient knows to call the clinic with any problems, questions or concerns. We can certainly see the patient much sooner if necessary.  The total time spent in the appointment was 55 minutes.  Disclaimer: This note was dictated with voice recognition software. Similar sounding words can inadvertently be transcribed and may not be corrected upon review.

## 2020-04-28 NOTE — Progress Notes (Signed)
START ON PATHWAY REGIMEN - Non-Small Cell Lung     A cycle is every 21 days:     Pembrolizumab      Paclitaxel      Carboplatin   **Always confirm dose/schedule in your pharmacy ordering system**  Patient Characteristics: Stage IV Metastatic, Squamous, PS = 0, 1, First Line, PD-L1 Expression Positive ? 50% (TPS) and Immunotherapy Candidate Therapeutic Status: Stage IV Metastatic Histology: Squamous Cell Line of therapy: First Line ECOG Performance Status: 1 PD-L1 Expression Status: PD-L1 Positive ? 50% (TPS) Immunotherapy Candidate Status: Candidate for Immunotherapy Intent of Therapy: Non-Curative / Palliative Intent, Discussed with Patient

## 2020-04-29 ENCOUNTER — Ambulatory Visit (HOSPITAL_COMMUNITY)
Admission: RE | Admit: 2020-04-29 | Discharge: 2020-04-29 | Disposition: A | Discharge status: Home / Self Care | Payer: 59 | Source: Ambulatory Visit | Attending: Internal Medicine | Admitting: Internal Medicine

## 2020-04-29 ENCOUNTER — Ambulatory Visit
Admission: RE | Admit: 2020-04-29 | Discharge: 2020-04-29 | Disposition: A | Discharge status: Home / Self Care | Payer: 59 | Source: Ambulatory Visit | Attending: Radiation Oncology | Admitting: Radiation Oncology

## 2020-04-29 ENCOUNTER — Other Ambulatory Visit: Payer: Self-pay

## 2020-04-29 DIAGNOSIS — C3491 Malignant neoplasm of unspecified part of right bronchus or lung: Secondary | ICD-10-CM | POA: Diagnosis present

## 2020-04-29 DIAGNOSIS — Z510 Encounter for antineoplastic radiation therapy: Secondary | ICD-10-CM | POA: Diagnosis not present

## 2020-04-29 LAB — GLUCOSE, CAPILLARY: Glucose-Capillary: 96 mg/dL (ref 70–99)

## 2020-04-29 MED ORDER — SONAFINE EX EMUL
1.0000 "application " | Freq: Two times a day (BID) | CUTANEOUS | Status: DC
  Administered 2020-04-29: 1 via TOPICAL

## 2020-04-29 MED ORDER — FLUDEOXYGLUCOSE F - 18 (FDG) INJECTION
12.0000 | Freq: Once | INTRAVENOUS | Status: AC | PRN
  Administered 2020-04-29: 12 via INTRAVENOUS

## 2020-04-30 ENCOUNTER — Ambulatory Visit: Payer: 59 | Admitting: Radiation Oncology

## 2020-04-30 ENCOUNTER — Ambulatory Visit
Admission: RE | Admit: 2020-04-30 | Discharge: 2020-04-30 | Disposition: A | Discharge status: Home / Self Care | Payer: 59 | Source: Ambulatory Visit | Attending: Radiation Oncology | Admitting: Radiation Oncology

## 2020-04-30 ENCOUNTER — Other Ambulatory Visit: Payer: Self-pay | Admitting: Medical Oncology

## 2020-04-30 ENCOUNTER — Other Ambulatory Visit: Payer: Self-pay

## 2020-04-30 DIAGNOSIS — Z510 Encounter for antineoplastic radiation therapy: Secondary | ICD-10-CM | POA: Diagnosis not present

## 2020-04-30 DIAGNOSIS — Z5111 Encounter for antineoplastic chemotherapy: Secondary | ICD-10-CM

## 2020-04-30 NOTE — Progress Notes (Signed)
The following biosimilar Ziextenzo ([id number]) has been selected for use in this patient per insurance.  Henreitta Leber, PharmD 04/30/20 @ 1140

## 2020-04-30 NOTE — Progress Notes (Signed)
Ziextenzo cannot be dispensed by the cancer center must be dispensed and sent to patient for self administration thru specialty pharmacy.    Henreitta Leber, PharmD 04/30/20 @ 1140

## 2020-05-01 ENCOUNTER — Other Ambulatory Visit: Payer: Self-pay | Admitting: Medical Oncology

## 2020-05-01 ENCOUNTER — Ambulatory Visit
Admission: RE | Admit: 2020-05-01 | Discharge: 2020-05-01 | Disposition: A | Discharge status: Home / Self Care | Payer: 59 | Source: Ambulatory Visit | Attending: Radiation Oncology | Admitting: Radiation Oncology

## 2020-05-01 DIAGNOSIS — Z510 Encounter for antineoplastic radiation therapy: Secondary | ICD-10-CM | POA: Diagnosis not present

## 2020-05-01 DIAGNOSIS — Z5111 Encounter for antineoplastic chemotherapy: Secondary | ICD-10-CM

## 2020-05-01 MED ORDER — ZIEXTENZO 6 MG/0.6ML ~~LOC~~ SOSY: 6.0000 mg | PREFILLED_SYRINGE | Freq: Once | SUBCUTANEOUS | 5 refills | Status: DC

## 2020-05-02 ENCOUNTER — Other Ambulatory Visit: Payer: Self-pay

## 2020-05-02 ENCOUNTER — Inpatient Hospital Stay: Payer: 59

## 2020-05-02 ENCOUNTER — Ambulatory Visit
Admission: RE | Admit: 2020-05-02 | Discharge: 2020-05-02 | Disposition: A | Discharge status: Home / Self Care | Payer: 59 | Source: Ambulatory Visit | Attending: Radiation Oncology | Admitting: Radiation Oncology

## 2020-05-02 ENCOUNTER — Other Ambulatory Visit (HOSPITAL_COMMUNITY): Payer: 59

## 2020-05-02 DIAGNOSIS — Z510 Encounter for antineoplastic radiation therapy: Secondary | ICD-10-CM | POA: Diagnosis not present

## 2020-05-05 ENCOUNTER — Other Ambulatory Visit: Payer: Self-pay | Admitting: Student

## 2020-05-05 ENCOUNTER — Inpatient Hospital Stay: Payer: 59

## 2020-05-05 ENCOUNTER — Other Ambulatory Visit: Payer: Self-pay

## 2020-05-05 ENCOUNTER — Other Ambulatory Visit: Payer: Self-pay | Admitting: Physician Assistant

## 2020-05-05 ENCOUNTER — Ambulatory Visit
Admission: RE | Admit: 2020-05-05 | Discharge: 2020-05-05 | Disposition: A | Discharge status: Home / Self Care | Payer: 59 | Source: Ambulatory Visit | Attending: Radiation Oncology | Admitting: Radiation Oncology

## 2020-05-05 ENCOUNTER — Ambulatory Visit: Payer: 59 | Admitting: Emergency Medicine

## 2020-05-05 VITALS — BP 139/91 | HR 84 | Temp 97.8°F | Resp 20

## 2020-05-05 DIAGNOSIS — C3491 Malignant neoplasm of unspecified part of right bronchus or lung: Secondary | ICD-10-CM

## 2020-05-05 DIAGNOSIS — Z510 Encounter for antineoplastic radiation therapy: Secondary | ICD-10-CM | POA: Diagnosis not present

## 2020-05-05 DIAGNOSIS — Z5112 Encounter for antineoplastic immunotherapy: Secondary | ICD-10-CM | POA: Diagnosis not present

## 2020-05-05 LAB — CMP (CANCER CENTER ONLY)
ALT: 13 U/L (ref 0–44)
AST: 16 U/L (ref 15–41)
Albumin: 3.1 g/dL — ABNORMAL LOW (ref 3.5–5.0)
Alkaline Phosphatase: 98 U/L (ref 38–126)
Anion gap: 7 (ref 5–15)
BUN: 8 mg/dL (ref 6–20)
CO2: 24 mmol/L (ref 22–32)
Calcium: 8.7 mg/dL — ABNORMAL LOW (ref 8.9–10.3)
Chloride: 105 mmol/L (ref 98–111)
Creatinine: 0.9 mg/dL (ref 0.61–1.24)
GFR, Estimated: >60 mL/min (ref >60)
Glucose, Bld: 128 mg/dL — ABNORMAL HIGH (ref 70–99)
Potassium: 3.5 mmol/L (ref 3.5–5.1)
Sodium: 136 mmol/L (ref 135–145)
Total Bilirubin: 0.9 mg/dL (ref 0.3–1.2)
Total Protein: 7.1 g/dL (ref 6.5–8.1)

## 2020-05-05 LAB — CBC WITH DIFFERENTIAL (CANCER CENTER ONLY)
Abs Immature Granulocytes: 0.01 10*3/uL (ref 0.00–0.07)
Basophils Absolute: 0 10*3/uL (ref 0.0–0.1)
Basophils Relative: 1 %
Eosinophils Absolute: 0.2 10*3/uL (ref 0.0–0.5)
Eosinophils Relative: 9 %
HCT: 34.3 % — ABNORMAL LOW (ref 39.0–52.0)
Hemoglobin: 11.1 g/dL — ABNORMAL LOW (ref 13.0–17.0)
Immature Granulocytes: 1 %
Lymphocytes Relative: 23 %
Lymphs Abs: 0.5 10*3/uL — ABNORMAL LOW (ref 0.7–4.0)
MCH: 26.6 pg (ref 26.0–34.0)
MCHC: 32.4 g/dL (ref 30.0–36.0)
MCV: 82.1 fL (ref 80.0–100.0)
Monocytes Absolute: 0.3 10*3/uL (ref 0.1–1.0)
Monocytes Relative: 14 %
Neutro Abs: 1.1 10*3/uL — ABNORMAL LOW (ref 1.7–7.7)
Neutrophils Relative %: 52 %
Platelet Count: 215 10*3/uL (ref 150–400)
RBC: 4.18 MIL/uL — ABNORMAL LOW (ref 4.22–5.81)
RDW: 14.7 % (ref 11.5–15.5)
WBC Count: 2.1 10*3/uL — ABNORMAL LOW (ref 4.0–10.5)
nRBC: 0 % (ref 0.0–0.2)

## 2020-05-05 LAB — TSH: TSH: 2.141 u[IU]/mL (ref 0.320–4.118)

## 2020-05-05 MED ORDER — SODIUM CHLORIDE 0.9 % IV SOLN
Freq: Once | INTRAVENOUS | Status: AC
  Filled 2020-05-05: qty 250

## 2020-05-05 MED ORDER — ZIEXTENZO 6 MG/0.6ML ~~LOC~~ SOSY: PREFILLED_SYRINGE | SUBCUTANEOUS | 3 refills | Status: DC

## 2020-05-05 MED ORDER — DIPHENHYDRAMINE HCL 50 MG/ML IJ SOLN
50.0000 mg | Freq: Once | INTRAMUSCULAR | Status: AC
  Administered 2020-05-05: 50 mg via INTRAVENOUS

## 2020-05-05 MED ORDER — FAMOTIDINE IN NACL 20-0.9 MG/50ML-% IV SOLN
INTRAVENOUS | Status: AC
  Filled 2020-05-05: qty 50

## 2020-05-05 MED ORDER — SODIUM CHLORIDE 0.9 % IV SOLN
200.0000 mg | Freq: Once | INTRAVENOUS | Status: AC
  Administered 2020-05-05: 200 mg via INTRAVENOUS
  Filled 2020-05-05: qty 8

## 2020-05-05 MED ORDER — SODIUM CHLORIDE 0.9 % IV SOLN
175.0000 mg/m2 | Freq: Once | INTRAVENOUS | Status: AC
  Administered 2020-05-05: 408 mg via INTRAVENOUS
  Filled 2020-05-05: qty 68

## 2020-05-05 MED ORDER — SODIUM CHLORIDE 0.9 % IV SOLN
150.0000 mg | Freq: Once | INTRAVENOUS | Status: AC
  Administered 2020-05-05: 150 mg via INTRAVENOUS
  Filled 2020-05-05: qty 150

## 2020-05-05 MED ORDER — SODIUM CHLORIDE 0.9 % IV SOLN
10.0000 mg | Freq: Once | INTRAVENOUS | Status: AC
  Administered 2020-05-05: 10 mg via INTRAVENOUS
  Filled 2020-05-05: qty 10

## 2020-05-05 MED ORDER — FAMOTIDINE IN NACL 20-0.9 MG/50ML-% IV SOLN
20.0000 mg | Freq: Once | INTRAVENOUS | Status: AC
  Administered 2020-05-05: 20 mg via INTRAVENOUS

## 2020-05-05 MED ORDER — PALONOSETRON HCL INJECTION 0.25 MG/5ML
INTRAVENOUS | Status: AC
  Filled 2020-05-05: qty 5

## 2020-05-05 MED ORDER — PALONOSETRON HCL INJECTION 0.25 MG/5ML
0.2500 mg | Freq: Once | INTRAVENOUS | Status: AC
  Administered 2020-05-05: 0.25 mg via INTRAVENOUS

## 2020-05-05 MED ORDER — ACETAMINOPHEN 325 MG PO TABS
ORAL_TABLET | ORAL | Status: AC
  Filled 2020-05-05: qty 2

## 2020-05-05 MED ORDER — DIPHENHYDRAMINE HCL 50 MG/ML IJ SOLN
INTRAMUSCULAR | Status: AC
  Filled 2020-05-05: qty 1

## 2020-05-05 MED ORDER — SODIUM CHLORIDE 0.9 % IV SOLN
750.0000 mg | Freq: Once | INTRAVENOUS | Status: AC
  Administered 2020-05-05: 750 mg via INTRAVENOUS
  Filled 2020-05-05: qty 75

## 2020-05-05 NOTE — Progress Notes (Signed)
Per Dr. [name]. Metcalfe for treatment today with ANC 1.1 and WBC 2.1. Pt to receive 1 liter NS. Per pharmacy, authorization given for Ziextenzo. Pt. has not received a call for the delivery time. Pt. Instructed to call us when it is scheduled or return to [facility] Wednesday for injection  if none delivered to his house. Pt. states he understands.

## 2020-05-05 NOTE — Patient Instructions (Signed)
[facility] Discharge Instructions for Patients Receiving Chemotherapy  Today you received the following immunotherapy agent: Pembrolizumab (Keytruda) and chemotherapy agents: Paclitaxel (Taxol) and Carboplatin   To help prevent nausea and vomiting after your treatment, we encourage you to take your nausea medication as directed by your MD.   If you develop nausea and vomiting that is not controlled by your nausea medication, call the clinic.   BELOW ARE SYMPTOMS THAT SHOULD BE REPORTED IMMEDIATELY:  *FEVER GREATER THAN 100.5 F  *CHILLS WITH OR WITHOUT FEVER  NAUSEA AND VOMITING THAT IS NOT CONTROLLED WITH YOUR NAUSEA MEDICATION  *UNUSUAL SHORTNESS OF BREATH  *UNUSUAL BRUISING OR BLEEDING  TENDERNESS IN MOUTH AND THROAT WITH OR WITHOUT PRESENCE OF ULCERS  *URINARY PROBLEMS  *BOWEL PROBLEMS  UNUSUAL RASH Items with * indicate a potential emergency and should be followed up as soon as possible.  Feel free to call the clinic should you have any questions or concerns. The clinic phone number is [phone].  Please show the [place] [facility] at check-in to the Emergency Department and triage nurse.  Pembrolizumab injection What is this medicine? PEMBROLIZUMAB (pem broe liz ue mab) is a monoclonal antibody. It is used to treat certain types of cancer. This medicine may be used for other purposes; ask your health care provider or pharmacist if you have questions. COMMON BRAND NAME(S): Keytruda What should I tell my health care provider before I take this medicine? They need to know if you have any of these conditions:  autoimmune diseases like Crohn's disease, ulcerative colitis, or lupus  have had or planning to have an allogeneic stem cell transplant (uses someone else's stem cells)  history of organ transplant  history of chest radiation  nervous system problems like myasthenia gravis or Guillain-Barre syndrome  an unusual or allergic reaction to  pembrolizumab, other medicines, foods, dyes, or preservatives  pregnant or trying to get pregnant  breast-feeding How should I use this medicine? This medicine is for infusion into a vein. It is given by a health care professional in a hospital or clinic setting. A special MedGuide will be given to you before each treatment. Be sure to read this information carefully each time. Talk to your pediatrician regarding the use of this medicine in children. While this drug may be prescribed for children as young as 6 months for selected conditions, precautions do apply. Overdosage: If you think you have taken too much of this medicine contact a poison control center or emergency room at once. NOTE: This medicine is only for you. Do not share this medicine with others. What if I miss a dose? It is important not to miss your dose. Call your doctor or health care professional if you are unable to keep an appointment. What may interact with this medicine? Interactions have not been studied. This list may not describe all possible interactions. Give your health care provider a list of all the medicines, herbs, non-prescription drugs, or dietary supplements you use. Also tell them if you smoke, drink alcohol, or use illegal drugs. Some items may interact with your medicine. What should I watch for while using this medicine? Your condition will be monitored carefully while you are receiving this medicine. You may need blood work done while you are taking this medicine. Do not become pregnant while taking this medicine or for 4 months after stopping it. Women should inform their doctor if they wish to become pregnant or think they might be pregnant. There is  a potential for serious side effects to an unborn child. Talk to your health care professional or pharmacist for more information. Do not breast-feed an infant while taking this medicine or for 4 months after the last dose. What side effects may I notice  from receiving this medicine? Side effects that you should report to your doctor or health care professional as soon as possible:  allergic reactions like skin rash, itching or hives, swelling of the face, lips, or tongue  bloody or black, tarry  breathing problems  changes in vision  chest pain  chills  confusion  constipation  cough  diarrhea  dizziness or feeling faint or lightheaded  fast or irregular heartbeat  fever  flushing  joint pain  low blood counts - this medicine may decrease the number of white blood cells, red blood cells and platelets. You may be at increased risk for infections and bleeding.  muscle pain  muscle weakness  pain, tingling, numbness in the hands or feet  persistent headache  redness, blistering, peeling or loosening of the skin, including inside the mouth  signs and symptoms of high blood sugar such as dizziness; dry mouth; dry skin; fruity breath; nausea; stomach pain; increased hunger or thirst; increased urination  signs and symptoms of kidney injury like trouble passing urine or change in the amount of urine  signs and symptoms of liver injury like dark urine, light-colored stools, loss of appetite, nausea, right upper belly pain, yellowing of the eyes or skin  sweating  swollen lymph nodes  weight loss Side effects that usually do not require medical attention (report to your doctor or health care professional if they continue or are bothersome):  decreased appetite  hair loss  tiredness This list may not describe all possible side effects. Call your doctor for medical advice about side effects. You may report side effects to FDA at 1-800-FDA-1088. Where should I keep my medicine? This drug is given in a hospital or clinic and will not be stored at home. NOTE: This sheet is a summary. It may not cover all possible information. If you have questions about this medicine, talk to your doctor, pharmacist, or health  care provider.  2021 Elsevier/Gold Standard (2019-01-03 21:44:53)  Paclitaxel injection What is this medicine? PACLITAXEL (PAK li TAX el) is a chemotherapy drug. It targets fast dividing cells, like cancer cells, and causes these cells to die. This medicine is used to treat ovarian cancer, breast cancer, lung cancer, Kaposi's sarcoma, and other cancers. This medicine may be used for other purposes; ask your health care provider or pharmacist if you have questions. COMMON BRAND NAME(S): Onxol, Taxol What should I tell my health care provider before I take this medicine? They need to know if you have any of these conditions:  history of irregular heartbeat  liver disease  low blood counts, like low white cell, platelet, or red cell counts  lung or breathing disease, like asthma  tingling of the fingers or toes, or other nerve disorder  an unusual or allergic reaction to paclitaxel, alcohol, polyoxyethylated castor oil, other chemotherapy, other medicines, foods, dyes, or preservatives  pregnant or trying to get pregnant  breast-feeding How should I use this medicine? This drug is given as an infusion into a vein. It is administered in a hospital or clinic by a specially trained health care professional. Talk to your pediatrician regarding the use of this medicine in children. Special care may be needed. Overdosage: If you think you have taken  too much of this medicine contact a poison control center or emergency room at once. NOTE: This medicine is only for you. Do not share this medicine with others. What if I miss a dose? It is important not to miss your dose. Call your doctor or health care professional if you are unable to keep an appointment. What may interact with this medicine? Do not take this medicine with any of the following medications:  live virus vaccines This medicine may also interact with the following medications:  antiviral medicines for hepatitis, HIV or  AIDS  certain antibiotics like erythromycin and clarithromycin  certain medicines for fungal infections like ketoconazole and itraconazole  certain medicines for seizures like carbamazepine, phenobarbital, phenytoin  gemfibrozil  nefazodone  rifampin  St. John's wort This list may not describe all possible interactions. Give your health care provider a list of all the medicines, herbs, non-prescription drugs, or dietary supplements you use. Also tell them if you smoke, drink alcohol, or use illegal drugs. Some items may interact with your medicine. What should I watch for while using this medicine? Your condition will be monitored carefully while you are receiving this medicine. You will need important blood work done while you are taking this medicine. This medicine can cause serious allergic reactions. To reduce your risk you will need to take other medicine(s) before treatment with this medicine. If you experience allergic reactions like skin rash, itching or hives, swelling of the face, lips, or tongue, tell your doctor or health care professional right away. In some cases, you may be given additional medicines to help with side effects. Follow all directions for their use. This drug may make you feel generally unwell. This is not uncommon, as chemotherapy can affect healthy cells as well as cancer cells. Report any side effects. Continue your course of treatment even though you feel ill unless your doctor tells you to stop. Call your doctor or health care professional for advice if you get a fever, chills or sore throat, or other symptoms of a cold or flu. Do not treat yourself. This drug decreases your body's ability to fight infections. Try to avoid being around people who are sick. This medicine may increase your risk to bruise or bleed. Call your doctor or health care professional if you notice any unusual bleeding. Be careful brushing and flossing your teeth or using a toothpick  because you may get an infection or bleed more easily. If you have any dental work done, tell your dentist you are receiving this medicine. Avoid taking products that contain aspirin, acetaminophen, ibuprofen, naproxen, or ketoprofen unless instructed by your doctor. These medicines may hide a fever. Do not become pregnant while taking this medicine. Women should inform their doctor if they wish to become pregnant or think they might be pregnant. There is a potential for serious side effects to an unborn child. Talk to your health care professional or pharmacist for more information. Do not breast-feed an infant while taking this medicine. Men are advised not to father a child while receiving this medicine. This product may contain alcohol. Ask your pharmacist or healthcare provider if this medicine contains alcohol. Be sure to tell all healthcare providers you are taking this medicine. Certain medicines, like metronidazole and disulfiram, can cause an unpleasant reaction when taken with alcohol. The reaction includes flushing, headache, nausea, vomiting, sweating, and increased thirst. The reaction can last from 30 minutes to several hours. What side effects may I notice from receiving this medicine?  Side effects that you should report to your doctor or health care professional as soon as possible:  allergic reactions like skin rash, itching or hives, swelling of the face, lips, or tongue  breathing problems  changes in vision  fast, irregular heartbeat  high or low blood pressure  mouth sores  pain, tingling, numbness in the hands or feet  signs of decreased platelets or bleeding - bruising, pinpoint red spots on the skin, black, tarry stools, blood in the urine  signs of decreased red blood cells - unusually weak or tired, feeling faint or lightheaded, falls  signs of infection - fever or chills, cough, sore throat, pain or difficulty passing urine  signs and symptoms of liver injury  like dark yellow or brown urine; general ill feeling or flu-like symptoms; light-colored stools; loss of appetite; nausea; right upper belly pain; unusually weak or tired; yellowing of the eyes or skin  swelling of the ankles, feet, hands  unusually slow heartbeat Side effects that usually do not require medical attention (report to your doctor or health care professional if they continue or are bothersome):  diarrhea  hair loss  loss of appetite  muscle or joint pain  nausea, vomiting  pain, redness, or irritation at site where injected  tiredness This list may not describe all possible side effects. Call your doctor for medical advice about side effects. You may report side effects to FDA at 1-800-FDA-1088. Where should I keep my medicine? This drug is given in a hospital or clinic and will not be stored at home. NOTE: This sheet is a summary. It may not cover all possible information. If you have questions about this medicine, talk to your doctor, pharmacist, or health care provider.  2021 Elsevier/Gold Standard (2019-01-03 13:37:23)  Carboplatin injection What is this medicine? CARBOPLATIN (KAR boe pla tin) is a chemotherapy drug. It targets fast dividing cells, like cancer cells, and causes these cells to die. This medicine is used to treat ovarian cancer and many other cancers. This medicine may be used for other purposes; ask your health care provider or pharmacist if you have questions. COMMON BRAND NAME(S): Paraplatin What should I tell my health care provider before I take this medicine? They need to know if you have any of these conditions:  blood disorders  hearing problems  kidney disease  recent or ongoing radiation therapy  an unusual or allergic reaction to carboplatin, cisplatin, other chemotherapy, other medicines, foods, dyes, or preservatives  pregnant or trying to get pregnant  breast-feeding How should I use this medicine? This drug is usually  given as an infusion into a vein. It is administered in a hospital or clinic by a specially trained health care professional. Talk to your pediatrician regarding the use of this medicine in children. Special care may be needed. Overdosage: If you think you have taken too much of this medicine contact a poison control center or emergency room at once. NOTE: This medicine is only for you. Do not share this medicine with others. What if I miss a dose? It is important not to miss a dose. Call your doctor or health care professional if you are unable to keep an appointment. What may interact with this medicine?  medicines for seizures  medicines to increase blood counts like filgrastim, pegfilgrastim, sargramostim  some antibiotics like amikacin, gentamicin, neomycin, streptomycin, tobramycin  vaccines Talk to your doctor or health care professional before taking any of these medicines:  acetaminophen  aspirin  ibuprofen  ketoprofen  naproxen This list may not describe all possible interactions. Give your health care provider a list of all the medicines, herbs, non-prescription drugs, or dietary supplements you use. Also tell them if you smoke, drink alcohol, or use illegal drugs. Some items may interact with your medicine. What should I watch for while using this medicine? Your condition will be monitored carefully while you are receiving this medicine. You will need important blood work done while you are taking this medicine. This drug may make you feel generally unwell. This is not uncommon, as chemotherapy can affect healthy cells as well as cancer cells. Report any side effects. Continue your course of treatment even though you feel ill unless your doctor tells you to stop. In some cases, you may be given additional medicines to help with side effects. Follow all directions for their use. Call your doctor or health care professional for advice if you get a fever, chills or sore  throat, or other symptoms of a cold or flu. Do not treat yourself. This drug decreases your body's ability to fight infections. Try to avoid being around people who are sick. This medicine may increase your risk to bruise or bleed. Call your doctor or health care professional if you notice any unusual bleeding. Be careful brushing and flossing your teeth or using a toothpick because you may get an infection or bleed more easily. If you have any dental work done, tell your dentist you are receiving this medicine. Avoid taking products that contain aspirin, acetaminophen, ibuprofen, naproxen, or ketoprofen unless instructed by your doctor. These medicines may hide a fever. Do not become pregnant while taking this medicine. Women should inform their doctor if they wish to become pregnant or think they might be pregnant. There is a potential for serious side effects to an unborn child. Talk to your health care professional or pharmacist for more information. Do not breast-feed an infant while taking this medicine. What side effects may I notice from receiving this medicine? Side effects that you should report to your doctor or health care professional as soon as possible:  allergic reactions like skin rash, itching or hives, swelling of the face, lips, or tongue  signs of infection - fever or chills, cough, sore throat, pain or difficulty passing urine  signs of decreased platelets or bleeding - bruising, pinpoint red spots on the skin, black, tarry stools, nosebleeds  signs of decreased red blood cells - unusually weak or tired, fainting spells, lightheadedness  breathing problems  changes in hearing  changes in vision  chest pain  high blood pressure  low blood counts - This drug may decrease the number of white blood cells, red blood cells and platelets. You may be at increased risk for infections and bleeding.  nausea and vomiting  pain, swelling, redness or irritation at the injection  site  pain, tingling, numbness in the hands or feet  problems with balance, talking, walking  trouble passing urine or change in the amount of urine Side effects that usually do not require medical attention (report to your doctor or health care professional if they continue or are bothersome):  hair loss  loss of appetite  metallic taste in the mouth or changes in taste This list may not describe all possible side effects. Call your doctor for medical advice about side effects. You may report side effects to FDA at 1-800-FDA-1088. Where should I keep my medicine? This drug is given in a hospital or clinic and will  not be stored at home. NOTE: This sheet is a summary. It may not cover all possible information. If you have questions about this medicine, talk to your doctor, pharmacist, or health care provider.  2021 Elsevier/Gold Standard (2007-05-09 14:38:05)

## 2020-05-06 ENCOUNTER — Encounter: Payer: Self-pay | Admitting: Physician Assistant

## 2020-05-06 ENCOUNTER — Ambulatory Visit (HOSPITAL_COMMUNITY)
Admission: RE | Admit: 2020-05-06 | Discharge: 2020-05-06 | Disposition: A | Discharge status: Home / Self Care | Payer: 59 | Source: Ambulatory Visit | Attending: Physician Assistant | Admitting: Physician Assistant

## 2020-05-06 ENCOUNTER — Other Ambulatory Visit: Payer: Self-pay

## 2020-05-06 ENCOUNTER — Telehealth: Payer: Self-pay | Admitting: *Deleted

## 2020-05-06 ENCOUNTER — Ambulatory Visit
Admission: RE | Admit: 2020-05-06 | Discharge: 2020-05-06 | Disposition: A | Discharge status: Home / Self Care | Payer: 59 | Source: Ambulatory Visit | Attending: Radiation Oncology | Admitting: Radiation Oncology

## 2020-05-06 ENCOUNTER — Encounter (HOSPITAL_COMMUNITY): Payer: Self-pay

## 2020-05-06 DIAGNOSIS — C3490 Malignant neoplasm of unspecified part of unspecified bronchus or lung: Secondary | ICD-10-CM | POA: Insufficient documentation

## 2020-05-06 DIAGNOSIS — Z87891 Personal history of nicotine dependence: Secondary | ICD-10-CM | POA: Diagnosis not present

## 2020-05-06 DIAGNOSIS — Z510 Encounter for antineoplastic radiation therapy: Secondary | ICD-10-CM | POA: Diagnosis not present

## 2020-05-06 DIAGNOSIS — C7971 Secondary malignant neoplasm of right adrenal gland: Secondary | ICD-10-CM | POA: Diagnosis not present

## 2020-05-06 DIAGNOSIS — E279 Disorder of adrenal gland, unspecified: Secondary | ICD-10-CM | POA: Diagnosis present

## 2020-05-06 DIAGNOSIS — C3491 Malignant neoplasm of unspecified part of right bronchus or lung: Secondary | ICD-10-CM

## 2020-05-06 LAB — CBC
HCT: 35.3 % — ABNORMAL LOW (ref 39.0–52.0)
Hemoglobin: 11.6 g/dL — ABNORMAL LOW (ref 13.0–17.0)
MCH: 26.9 pg (ref 26.0–34.0)
MCHC: 32.9 g/dL (ref 30.0–36.0)
MCV: 81.7 fL (ref 80.0–100.0)
Platelets: 266 10*3/uL (ref 150–400)
RBC: 4.32 MIL/uL (ref 4.22–5.81)
RDW: 14.7 % (ref 11.5–15.5)
WBC: 3.7 10*3/uL — ABNORMAL LOW (ref 4.0–10.5)
nRBC: 0 % (ref 0.0–0.2)

## 2020-05-06 LAB — PROTIME-INR
INR: 1 (ref 0.8–1.2)
Prothrombin Time: 13.1 seconds (ref 11.4–15.2)

## 2020-05-06 MED ORDER — LIDOCAINE HCL 1 % IJ SOLN
INTRAMUSCULAR | Status: AC
  Filled 2020-05-06: qty 20

## 2020-05-06 MED ORDER — SODIUM CHLORIDE 0.9 % IV SOLN: INTRAVENOUS | Status: DC

## 2020-05-06 NOTE — Sedation Documentation (Signed)
Patient denies pain and is resting comfortably. Will recover in Short Stay for 2 hours.

## 2020-05-06 NOTE — Progress Notes (Signed)
Ambulated to the bathroom to void tol well  

## 2020-05-06 NOTE — Procedures (Signed)
Interventional Radiology Procedure Note  Procedure: CT RT ADRENAL MASS CORE BX    Complications: None  Estimated Blood Loss:  MIN  Findings: 18 G CORES, sampling fragmented Path pending    Tamera Punt, MD

## 2020-05-06 NOTE — Sedation Documentation (Signed)
The patient is requesting no sedation at this time. He will continue to be monitored throughout the case for discomfort and pain. Dr. Annamaria Boots is aware and agreeable.

## 2020-05-06 NOTE — Telephone Encounter (Signed)
-----   Message from John Lumberton, RN sent at 05/05/2020  3:50 PM EDT ----- Regarding: Dr. [name] Pt. received first time Keytruda, Taxol, and Carboplatin. Tolerated treatment well. He is due for his Ziextenzo on Wednesday, it has been authorized, but he has not received the medication at his home. Please check to see that he has received it when you call for follow up. Thank you!

## 2020-05-06 NOTE — H&P (Signed)
Chief Complaint: Patient was seen in consultation today for right adrenal mass biopsy at the request of John [name]  Referring Physician(s): John [name] Dr Mayme Genta Dr [name]  Supervising Physician: John [name]  Patient Status: [facility] - Out[facility]  History of Present Illness: John [name] is a 60 y.o. male   Hx hemoptysis - was seen by PMD 02/2020 New Dx Non small cell lung cancer 02/2020 Squamous cell carcinoma Large RUL mass; right hilar and mediastinal lymphadenopathy Suspicious Rt adrenal gland mass  Has begun chemo and radiation treatments already with Radiation and Oncology  PET 04/29/20: IMPRESSION: 1. Right parahilar mass lesion is markedly hypermetabolic consistent with primary bronchogenic neoplasm. The hypermetabolic soft tissue from this lesion extends into the upper right hilum. No evidence for hypermetabolic metastases in the neck, mediastinum or left hilum. 2. Numerous tiny satellite nodules in the right lung below threshold for resolution on PET imaging. Dominant additional right lung nodule measuring 11 mm in the right upper lobe shows only low level FDG accumulation. 3. Interval progression of right adrenal mass, now measuring 6.5 cm and demonstrating marked hypermetabolism, highly suspicious for metastatic disease. 4. No other findings to suggest hypermetabolic metastases in the abdomen or pelvis  Request made for Rt adrenal mass biopsy    Past Medical History:  Diagnosis Date   Dyspnea    Family history of adverse reaction to anesthesia    brother with seizures had episode under anesthesia.  Patient has seizures as well.   GERD (gastroesophageal reflux disease)    Rheumatoid aortitis     Past Surgical History:  Procedure Laterality Date   BRONCHIAL BRUSHINGS  03/27/2020   Procedure: BRONCHIAL BRUSHINGS;  Surgeon: [name], MD;  Location: [facility];  Service: Cardiopulmonary;;   EYE SURGERY     FINE  NEEDLE ASPIRATION  03/27/2020   Procedure: FINE NEEDLE ASPIRATION;  Surgeon: [name], MD;  Location: [facility];  Service: Cardiopulmonary;;   HIP SURGERY Left    VIDEO BRONCHOSCOPY WITH ENDOBRONCHIAL ULTRASOUND N/A 03/27/2020   Procedure: VIDEO BRONCHOSCOPY WITH ENDOBRONCHIAL ULTRASOUND;  Surgeon: [name], MD;  Location: [facility];  Service: Cardiopulmonary;  Laterality: N/A;    Allergies: Patient has no known allergies.  Medications: Prior to Admission medications   Medication Sig Start Date End Date Taking? Authorizing Provider  CARBOplatin 1000 MG/100ML SOLN Inject into the vein.   Yes [provider]  leflunomide (ARAVA) 20 MG tablet Take 20 mg by mouth at bedtime. 02/21/20  Yes [provider]  PACLitaxel 100 MG/16.67ML CONC Inject into the vein.   Yes [provider]  PARoxetine (PAXIL) 10 MG tablet Take 10 mg by mouth daily. 01/25/20  Yes [provider]  PEGFILGRASTIM-APGF Arab Inject into the skin.   Yes [provider]  PRILOSEC OTC 20 MG tablet Take 20 mg by mouth at bedtime. 11/13/19  Yes [provider]  prochlorperazine (COMPAZINE) 10 MG tablet Take 1 tablet (10 mg total) by mouth every 6 (six) hours as needed for nausea or vomiting. 04/28/20  Yes Curt Bears, MD  SIMPONI 50 MG/0.5ML SOAJ Inject 50 mg into the skin every 30 (thirty) days. 03/23/20  Yes [provider]  sodium chloride 0.9 % SOLN 50 mL with pembrolizumab 100 MG/4ML SOLN 2 mg/kg Inject 2 mg/kg into the vein.   Yes [provider]  pegfilgrastim-bmez Tyson Dense) 6 MG/0.6ML injection Inject 0.6 mL into the skin 2 days after chemotherapy 05/05/20   [name], [name], PA-C  History reviewed. No pertinent family history.  Social History   Socioeconomic History   Marital status: Married    Spouse name: Not on file   Number of children: Not on file   Years of education: Not on file   Highest education  level: Not on file  Occupational History   Not on file  Tobacco Use   Smoking status: Former Smoker    Packs/day: 1.00    Years: 30.00    Pack years: 30.00    Types: Cigarettes    Quit date: 2018    Years since quitting: 4.2   Smokeless tobacco: Never Used  Scientific laboratory technician Use: Never used  Substance and Sexual Activity   Alcohol use: Not Currently   Drug use: Never   Sexual activity: Not on file  Other Topics Concern   Not on file  Social History Narrative   Not on file   Social Determinants of Health   Financial Resource Strain: Not on file  Food Insecurity: Not on file  Transportation Needs: Not on file  Physical Activity: Not on file  Stress: Not on file  Social Connections: Not on file     Review of Systems: A 12 point ROS discussed and pertinent positives are indicated in the HPI above.  All other systems are negative.  Review of Systems  Constitutional: Negative for fatigue and fever.  Respiratory: Positive for cough. Negative for shortness of breath.   Cardiovascular: Negative for chest pain.  Gastrointestinal: Negative for abdominal pain.  Neurological: Negative for weakness.  Psychiatric/Behavioral: Negative for behavioral problems and confusion.    Vital Signs: BP 115/75    Pulse (!) 109    Temp 97.8 F (36.6 C) (Oral)    Resp 19    Ht 5' 11.5" (1.816 m)    Wt 234 lb 11.2 oz (106.5 kg)    SpO2 96%    BMI 32.28 kg/m   Physical Exam Vitals reviewed.  HENT:     Mouth/Throat:     Mouth: Mucous membranes are moist.  Cardiovascular:     Rate and Rhythm: Normal rate and regular rhythm.     Heart sounds: Normal heart sounds.  Pulmonary:     Effort: Pulmonary effort is normal.     Breath sounds: Normal breath sounds.  Abdominal:     Palpations: Abdomen is soft.  Musculoskeletal:        General: Normal range of motion.  Skin:    General: Skin is warm.  Neurological:     Mental Status: He is alert and oriented to person, place, and time.   Psychiatric:        Behavior: Behavior normal.     Imaging: DG Eye Foreign Body  Result Date: 04/15/2020 CLINICAL DATA:  Metal working/exposure; clearance prior to MRI EXAM: ORBITS FOR FOREIGN BODY - 2 VIEW COMPARISON:  None. FINDINGS: There is no evidence of metallic foreign body within the orbits. No significant bone abnormality identified. IMPRESSION: No evidence of metallic foreign body within the orbits. Electronically Signed   By: Rolm Baptise M.D.   On: 04/15/2020 12:05   MR BRAIN W WO CONTRAST  Result Date: 04/15/2020 CLINICAL DATA:  Non-small-cell lung cancer staging EXAM: MRI HEAD WITHOUT AND WITH CONTRAST TECHNIQUE: Multiplanar, multiecho pulse sequences of the brain and surrounding structures were obtained without and with intravenous contrast. CONTRAST:  25mL GADAVIST GADOBUTROL 1 MMOL/ML IV SOLN COMPARISON:  None. FINDINGS: Brain: No acute infarction, hemorrhage, hydrocephalus, extra-axial collection or mass  lesion. Scattered small deep white matter hyperintensities bilaterally likely chronic microvascular ischemia. No enhancing metastatic deposits identified. Vascular: Normal arterial flow voids. Skull and upper cervical spine: No focal skeletal lesion. Sinuses/Orbits: Mild mucosal edema paranasal sinuses. Bilateral cataract extraction. Other: None IMPRESSION: Negative for metastatic disease to the brain. Electronically Signed   By: Franchot Gallo M.D.   On: 04/15/2020 16:24   NM PET Image Initial (PI) Skull Base To Thigh  Result Date: 04/30/2020 CLINICAL DATA:  Initial treatment strategy for non-small-cell lung cancer. EXAM: NUCLEAR MEDICINE PET SKULL BASE TO THIGH TECHNIQUE: 12 mCi F-18 FDG was injected intravenously. Full-ring PET imaging was performed from the skull base to thigh after the radiotracer. CT data was obtained and used for attenuation correction and anatomic localization. Fasting blood glucose: 96 mg/dl COMPARISON:  Chest CT 03/11/2020 FINDINGS: Mediastinal blood pool  activity: SUV max 2.3 Liver activity: SUV max NA NECK: No hypermetabolic lymph nodes in the neck. Incidental CT findings: none CHEST: Right parahilar lung mass is markedly hypermetabolic with SUV max = 49.7. Central photopenia in the lesion is compatible with necrosis. Hypermetabolic tissue extends from this dominant mass into the right hilum. Numerous tiny satellite nodules evident on CT imaging. 11 mm nodule more cranially in the right upper lobe (17/8) shows low level hypermetabolism with SUV max = 1.4. No hypermetabolic mediastinal or left hilar metastatic lymphadenopathy. No evidence for hypermetabolic axillary lymphadenopathy. Incidental CT findings: Coronary artery calcification is evident. Atherosclerotic calcification is noted in the wall of the thoracic aorta. Centrilobular emphsyema noted. 4 mm left upper lobe nodule identified on 68/4. This is stable since prior diagnostic chest CT in appeared calcified on that exam compatible with granuloma. ABDOMEN/PELVIS: No abnormal hypermetabolic activity within the liver, pancreas, left adrenal gland, or spleen. No hypermetabolic lymph nodes in the abdomen or pelvis. Right adrenal mass is progressive in the interval since prior CT, measuring 6.5 cm today compared to 5.4 cm previously. This lesion is markedly hypermetabolic with SUV max = 14 and demonstrates central necrosis. Incidental CT findings: 18 mm calcified gallstone noted in the neck of the gallbladder. Small duodenal diverticulum noted. There is abdominal aortic atherosclerosis without aneurysm. Small left groin hernia contains only fat. SKELETON: No focal hypermetabolic activity to suggest skeletal metastasis. Incidental CT findings: No worrisome lytic or sclerotic osseous abnormality. IMPRESSION: 1. Right parahilar mass lesion is markedly hypermetabolic consistent with primary bronchogenic neoplasm. The hypermetabolic soft tissue from this lesion extends into the upper right hilum. No evidence for  hypermetabolic metastases in the neck, mediastinum or left hilum. 2. Numerous tiny satellite nodules in the right lung below threshold for resolution on PET imaging. Dominant additional right lung nodule measuring 11 mm in the right upper lobe shows only low level FDG accumulation. 3. Interval progression of right adrenal mass, now measuring 6.5 cm and demonstrating marked hypermetabolism, highly suspicious for metastatic disease. 4. No other findings to suggest hypermetabolic metastases in the abdomen or pelvis. 5. Cholelithiasis. 6.  Emphysema. (ICD10-J43.9) 7.  Aortic Atherosclerois (ICD10-170.0) Electronically Signed   By: Misty Stanley M.D.   On: 04/30/2020 11:04    Labs:  CBC: Recent Labs    03/24/20 1506 04/10/20 1332 04/17/20 1354 05/05/20 0745  WBC 4.0 3.8* 3.9* 2.1*  HGB 11.6* 11.5* 11.7* 11.1*  HCT 35.0* 35.5* 36.3* 34.3*  PLT 293.0 233 270 215    COAGS: Recent Labs    03/24/20 1506  INR 1.2*    BMP: Recent Labs    03/24/20 1506 04/10/20  1332 04/17/20 1354 05/05/20 0745  NA 135 137 135 136  K 3.7 3.8 4.0 3.5  CL 100 102 101 105  CO2 28 25 25 24   GLUCOSE 97 116* 95 128*  BUN 8 6 7 8   CALCIUM 8.8 8.7* 8.7* 8.7*  CREATININE 0.82 0.85 0.90 0.90  GFRNONAA  --  >60 >60 >60    LIVER FUNCTION TESTS: Recent Labs    04/10/20 1332 04/17/20 1354 05/05/20 0745  BILITOT 0.8 0.8 0.9  AST 19 16 16   ALT 11 11 13   ALKPHOS 119 118 98  PROT 7.4 7.2 7.1  ALBUMIN 3.2* 3.3* 3.1*    TUMOR MARKERS: No results for input(s): AFPTM, CEA, CA199, CHROMGRNA in the last 8760 hours.  Assessment and Plan:  Newly dx non small cell lung cancer  +PET findings Right adrenal mass  Scheduled for right adrenal mass biopsy today-- determine course of action and treatment Risks and benefits of right adrenal mass biopsy was discussed with the patient and/or patient's family including, but not limited to bleeding, infection, damage to adjacent structures or low yield requiring  additional tests.  All of the questions were answered and there is agreement to proceed. Consent signed and in chart.  Thank you for this interesting consult.  I greatly enjoyed meeting Adith Tejada and look forward to participating in their care.  A copy of this report was sent to the requesting provider on this date.  Electronically Signed: Lavonia Drafts, PA-C 05/06/2020, 10:06 AM   I spent a total of  30 Minutes   in face to face in clinical consultation, greater than 50% of which was counseling/coordinating care for right adrenal mass bx

## 2020-05-06 NOTE — Discharge Instructions (Signed)

## 2020-05-06 NOTE — Progress Notes (Signed)
Discharge instructions reviewed with pt and his wife (via telephone) both voice understanding.  

## 2020-05-06 NOTE — Progress Notes (Signed)
Received call from patient whom was given my card per my request.  Introduced myself as Arboriculturist and to offer available resources.  Discussed one-time $1000 Radio broadcast assistant to assist with personal expenses while going through treatment. Based on verbal income guidelines, patient states they exceed. Advised if anything changes, to reach out to me.  Advised patient I also review for copay assistance if any available for diagnosis or treatment drugs, I would reach out to him to let him know.  He also had questions regarding cost and billing for Radiation and chemo. Advised him Radiation bills at the end and once insurance has paid, he would receive the remainder of the bill if anything is left from insurance. Advised with chemo, he would receive a bill a little while after after insurance has paid if he has not met his ded/OOP. Encouraged to call insurance to find out status if he has not received EOBs which will advise him as well where he stands with insurance amounts.  He has my card for any additional financial questions or concerns.

## 2020-05-06 NOTE — Telephone Encounter (Addendum)
Left message for pt to return call to let us know how he did with his treatment & to see if he had received his Pegfilgrastim.   Pt returned call with vm yesterday & reported having biopsy but doing OK. He called back today & reports feeling dizzy & having some numbness in his bottom lip.  He attributes the dizziness to having to be NPO for biopsy.  He has been drinking extra fluids & is some better but encouraged to push fluids & check his BP/P & call if orthostatic & also to ask to speak with nurse tomorrow if symptom persist.  Message routed to Dr Mohamed/Pod RN.

## 2020-05-07 ENCOUNTER — Ambulatory Visit
Admission: RE | Admit: 2020-05-07 | Discharge: 2020-05-07 | Disposition: A | Discharge status: Home / Self Care | Payer: 59 | Source: Ambulatory Visit | Attending: Radiation Oncology | Admitting: Radiation Oncology

## 2020-05-07 ENCOUNTER — Inpatient Hospital Stay: Payer: 59

## 2020-05-07 DIAGNOSIS — Z510 Encounter for antineoplastic radiation therapy: Secondary | ICD-10-CM | POA: Diagnosis not present

## 2020-05-07 LAB — SURGICAL PATHOLOGY

## 2020-05-07 NOTE — Progress Notes (Signed)
Given at home per RN.

## 2020-05-08 ENCOUNTER — Other Ambulatory Visit: Payer: Self-pay

## 2020-05-08 ENCOUNTER — Ambulatory Visit
Admission: RE | Admit: 2020-05-08 | Discharge: 2020-05-08 | Disposition: A | Discharge status: Home / Self Care | Payer: 59 | Source: Ambulatory Visit | Attending: Radiation Oncology | Admitting: Radiation Oncology

## 2020-05-08 DIAGNOSIS — Z510 Encounter for antineoplastic radiation therapy: Secondary | ICD-10-CM | POA: Diagnosis not present

## 2020-05-09 ENCOUNTER — Ambulatory Visit
Admission: RE | Admit: 2020-05-09 | Discharge: 2020-05-09 | Disposition: A | Discharge status: Home / Self Care | Payer: 59 | Source: Ambulatory Visit | Attending: Radiation Oncology | Admitting: Radiation Oncology

## 2020-05-09 DIAGNOSIS — R6520 Severe sepsis without septic shock: Secondary | ICD-10-CM | POA: Diagnosis present

## 2020-05-09 DIAGNOSIS — Z79899 Other long term (current) drug therapy: Secondary | ICD-10-CM

## 2020-05-09 DIAGNOSIS — E669 Obesity, unspecified: Secondary | ICD-10-CM | POA: Diagnosis present

## 2020-05-09 DIAGNOSIS — R1310 Dysphagia, unspecified: Secondary | ICD-10-CM | POA: Diagnosis present

## 2020-05-09 DIAGNOSIS — F32A Depression, unspecified: Secondary | ICD-10-CM | POA: Diagnosis present

## 2020-05-09 DIAGNOSIS — Z20822 Contact with and (suspected) exposure to covid-19: Secondary | ICD-10-CM | POA: Diagnosis present

## 2020-05-09 DIAGNOSIS — E876 Hypokalemia: Secondary | ICD-10-CM | POA: Diagnosis present

## 2020-05-09 DIAGNOSIS — A0472 Enterocolitis due to Clostridium difficile, not specified as recurrent: Secondary | ICD-10-CM | POA: Diagnosis present

## 2020-05-09 DIAGNOSIS — E871 Hypo-osmolality and hyponatremia: Secondary | ICD-10-CM | POA: Diagnosis present

## 2020-05-09 DIAGNOSIS — I2699 Other pulmonary embolism without acute cor pulmonale: Secondary | ICD-10-CM | POA: Diagnosis present

## 2020-05-09 DIAGNOSIS — B9620 Unspecified Escherichia coli [E. coli] as the cause of diseases classified elsewhere: Secondary | ICD-10-CM | POA: Diagnosis present

## 2020-05-09 DIAGNOSIS — A4151 Sepsis due to Escherichia coli [E. coli]: Secondary | ICD-10-CM | POA: Diagnosis not present

## 2020-05-09 DIAGNOSIS — C7971 Secondary malignant neoplasm of right adrenal gland: Secondary | ICD-10-CM | POA: Diagnosis present

## 2020-05-09 DIAGNOSIS — I951 Orthostatic hypotension: Secondary | ICD-10-CM | POA: Diagnosis present

## 2020-05-09 DIAGNOSIS — D649 Anemia, unspecified: Secondary | ICD-10-CM | POA: Diagnosis present

## 2020-05-09 DIAGNOSIS — I480 Paroxysmal atrial fibrillation: Secondary | ICD-10-CM | POA: Diagnosis present

## 2020-05-09 DIAGNOSIS — R5081 Fever presenting with conditions classified elsewhere: Secondary | ICD-10-CM | POA: Diagnosis present

## 2020-05-09 DIAGNOSIS — C3491 Malignant neoplasm of unspecified part of right bronchus or lung: Secondary | ICD-10-CM | POA: Diagnosis present

## 2020-05-09 DIAGNOSIS — E861 Hypovolemia: Secondary | ICD-10-CM | POA: Diagnosis present

## 2020-05-09 DIAGNOSIS — Z8616 Personal history of COVID-19: Secondary | ICD-10-CM

## 2020-05-09 DIAGNOSIS — D6959 Other secondary thrombocytopenia: Secondary | ICD-10-CM | POA: Diagnosis present

## 2020-05-09 DIAGNOSIS — D701 Agranulocytosis secondary to cancer chemotherapy: Secondary | ICD-10-CM | POA: Diagnosis present

## 2020-05-09 DIAGNOSIS — Z6832 Body mass index (BMI) 32.0-32.9, adult: Secondary | ICD-10-CM

## 2020-05-09 DIAGNOSIS — M069 Rheumatoid arthritis, unspecified: Secondary | ICD-10-CM | POA: Diagnosis present

## 2020-05-09 DIAGNOSIS — T451X5A Adverse effect of antineoplastic and immunosuppressive drugs, initial encounter: Secondary | ICD-10-CM | POA: Diagnosis present

## 2020-05-09 DIAGNOSIS — R531 Weakness: Secondary | ICD-10-CM | POA: Diagnosis not present

## 2020-05-09 DIAGNOSIS — Z8249 Family history of ischemic heart disease and other diseases of the circulatory system: Secondary | ICD-10-CM

## 2020-05-09 DIAGNOSIS — K219 Gastro-esophageal reflux disease without esophagitis: Secondary | ICD-10-CM | POA: Diagnosis present

## 2020-05-09 DIAGNOSIS — Z87891 Personal history of nicotine dependence: Secondary | ICD-10-CM

## 2020-05-09 DIAGNOSIS — E860 Dehydration: Secondary | ICD-10-CM | POA: Diagnosis present

## 2020-05-09 NOTE — Progress Notes (Deleted)
[facility] OFFICE PROGRESS NOTE  John Nian, MD [place] [id number]  DIAGNOSIS: stage IV (T4, N2, M1C)non-small cell lung cancer, squamous cell carcinoma presented with large right upper lobe lung mass in addition to satellite nodule in the right upper lobe and few other subcentimeter nodules as well as right hilar and mediastinal lymphadenopathy and metastatic disease to the right adrenal gland mass diagnosed in February 2022.  PDL1:70%  PRIOR THERAPY: None  CURRENT THERAPY: 1) Palliative radiotherapy to the large right upper lobe lung mass and mediastinal lymphadenopathy under the care of Dr. [name]. Last dose expected on 06/04/20.  2) Systemic chemotherapy with carboplatin for AUC of 5, paclitaxel 175 mg/M2 and Keytruda 200 mg IV every 3 weeks with Neulasta support.  First dose May 06, 2019.    INTERVAL HISTORY: John [name] 60 y.o. male returns to the clinic today for a follow up visit. The patient is feeling _ today without any concerning complaints except for _. The patient underwent his first cycle of chemotherapy last week and tolerated it _ without any adverse side effects except for _. He underwent a CT biopsy of the right adrenal gland lesion which was consistent with metastatic squamous cell carcinoma.   He continues to undergo radiation treatment under the care of Dr. [name]. He is expected to complete this on 06/04/20.   Otherwise, the patient denies fevers, chills, night sweats, or weight loss. He denies hemoptysis. He reports (stable?) dyspnea on exertion and mild cough. His prior chest tightness is _. He denies nausea, vomiting, diarrhea, or constipation. He denies headaches or visual changes. He denies rashes or skin changes. He is here for evaluation and a 1 week follow up visit and to manage any adverse side effects of treatment.    MEDICAL HISTORY: Past Medical History:  Diagnosis Date  . Dyspnea   . Family history of  adverse reaction to anesthesia    brother with seizures had episode under anesthesia.  Patient has seizures as well.  John [name] GERD (gastroesophageal reflux disease)   . Rheumatoid aortitis     ALLERGIES:  has No Known Allergies.  MEDICATIONS:  Current Outpatient Medications  Medication Sig Dispense Refill  . CARBOplatin 1000 MG/100ML SOLN Inject into the vein.    John [name] leflunomide (ARAVA) 20 MG tablet Take 20 mg by mouth at bedtime.    John [name] PACLitaxel 100 MG/16.67ML CONC Inject into the vein.    John [name] PARoxetine (PAXIL) 10 MG tablet Take 10 mg by mouth daily.    John [name] PEGFILGRASTIM-APGF [place] Inject into the skin.    John [name] pegfilgrastim-bmez (ZIEXTENZO) 6 MG/0.6ML injection Inject 0.6 mL into the skin 2 days after chemotherapy 0.6 mL 3  . PRILOSEC OTC 20 MG tablet Take 20 mg by mouth at bedtime.    . prochlorperazine (COMPAZINE) 10 MG tablet Take 1 tablet (10 mg total) by mouth every 6 (six) hours as needed for nausea or vomiting. 30 tablet 0  . SIMPONI 50 MG/0.5ML SOAJ Inject 50 mg into the skin every 30 (thirty) days.    . sodium chloride 0.9 % SOLN 50 mL with pembrolizumab 100 MG/4ML SOLN 2 mg/kg Inject 2 mg/kg into the vein.     No current facility-administered medications for this visit.    SURGICAL HISTORY:  Past Surgical History:  Procedure Laterality Date  . BRONCHIAL BRUSHINGS  03/27/2020   Procedure: BRONCHIAL BRUSHINGS;  Surgeon: [name], MD;  Location: [facility] ENDOSCOPY;  Service: Cardiopulmonary;;  . EYE SURGERY    .  FINE NEEDLE ASPIRATION  03/27/2020   Procedure: FINE NEEDLE ASPIRATION;  Surgeon: [name], MD;  Location: [facility] ENDOSCOPY;  Service: Cardiopulmonary;;  . HIP SURGERY Left   . VIDEO BRONCHOSCOPY WITH ENDOBRONCHIAL ULTRASOUND N/A 03/27/2020   Procedure: VIDEO BRONCHOSCOPY WITH ENDOBRONCHIAL ULTRASOUND;  Surgeon: [name], MD;  Location: [facility];  Service: Cardiopulmonary;  Laterality: N/A;    REVIEW OF SYSTEMS:   Review of Systems  Constitutional: Negative for  appetite change, chills, fatigue, fever and unexpected weight change.  HENT:   Negative for mouth sores, nosebleeds, sore throat and trouble swallowing.   Eyes: Negative for eye problems and icterus.  Respiratory: Negative for cough, hemoptysis, shortness of breath and wheezing.   Cardiovascular: Negative for chest pain and leg swelling.  Gastrointestinal: Negative for abdominal pain, constipation, diarrhea, nausea and vomiting.  Genitourinary: Negative for bladder incontinence, difficulty urinating, dysuria, frequency and hematuria.   Musculoskeletal: Negative for back pain, gait problem, neck pain and neck stiffness.  Skin: Negative for itching and rash.  Neurological: Negative for dizziness, extremity weakness, gait problem, headaches, light-headedness and seizures.  Hematological: Negative for adenopathy. Does not bruise/bleed easily.  Psychiatric/Behavioral: Negative for confusion, depression and sleep disturbance. The patient is not nervous/anxious.     PHYSICAL EXAMINATION:  There were no vitals taken for this visit.  ECOG PERFORMANCE STATUS: {CHL ONC ECOG [id number]  Physical Exam  Constitutional: Oriented to person, place, and time and well-developed, well-nourished, and in no distress. No distress.  HENT:  Head: Normocephalic and atraumatic.  Mouth/Throat: Oropharynx is clear and moist. No oropharyngeal exudate.  Eyes: Conjunctivae are normal. Right eye exhibits no discharge. Left eye exhibits no discharge. No scleral icterus.  Neck: Normal range of motion. Neck supple.  Cardiovascular: Normal rate, regular rhythm, normal heart sounds and intact distal pulses.   Pulmonary/Chest: Effort normal and breath sounds normal. No respiratory distress. No wheezes. No rales.  Abdominal: Soft. Bowel sounds are normal. Exhibits no distension and no mass. There is no tenderness.  Musculoskeletal: Normal range of motion. Exhibits no edema.  Lymphadenopathy:    No cervical adenopathy.   Neurological: Alert and oriented to person, place, and time. Exhibits normal muscle tone. Gait normal. Coordination normal.  Skin: Skin is warm and dry. No rash noted. Not diaphoretic. No erythema. No pallor.  Psychiatric: Mood, memory and judgment normal.  Vitals reviewed.  LABORATORY DATA: Lab Results  Component Value Date   WBC 3.7 (L) 05/06/2020   HGB 11.6 (L) 05/06/2020   HCT 35.3 (L) 05/06/2020   MCV 81.7 05/06/2020   PLT 266 05/06/2020      Chemistry      Component Value Date/Time   NA 136 05/05/2020 0745   K 3.5 05/05/2020 0745   CL 105 05/05/2020 0745   CO2 24 05/05/2020 0745   BUN 8 05/05/2020 0745   CREATININE 0.90 05/05/2020 0745      Component Value Date/Time   CALCIUM 8.7 (L) 05/05/2020 0745   ALKPHOS 98 05/05/2020 0745   AST 16 05/05/2020 0745   ALT 13 05/05/2020 0745   BILITOT 0.9 05/05/2020 0745       RADIOGRAPHIC STUDIES:  DG Eye Foreign Body  Result Date: 04/15/2020 CLINICAL DATA:  Metal working/exposure; clearance prior to MRI EXAM: ORBITS FOR FOREIGN BODY - 2 VIEW COMPARISON:  None. FINDINGS: There is no evidence of metallic foreign body within the orbits. No significant bone abnormality identified. IMPRESSION: No evidence of metallic foreign body within the orbits. Electronically Signed  By: Rolm Baptise M.D.   On: 04/15/2020 12:05   MR BRAIN W WO CONTRAST  Result Date: 04/15/2020 CLINICAL DATA:  Non-small-cell lung cancer staging EXAM: MRI HEAD WITHOUT AND WITH CONTRAST TECHNIQUE: Multiplanar, multiecho pulse sequences of the brain and surrounding structures were obtained without and with intravenous contrast. CONTRAST:  34m GADAVIST GADOBUTROL 1 MMOL/ML IV SOLN COMPARISON:  None. FINDINGS: Brain: No acute infarction, hemorrhage, hydrocephalus, extra-axial collection or mass lesion. Scattered small deep white matter hyperintensities bilaterally likely chronic microvascular ischemia. No enhancing metastatic deposits identified. Vascular: Normal  arterial flow voids. Skull and upper cervical spine: No focal skeletal lesion. Sinuses/Orbits: Mild mucosal edema paranasal sinuses. Bilateral cataract extraction. Other: None IMPRESSION: Negative for metastatic disease to the brain. Electronically Signed   By: CFranchot GalloM.D.   On: 04/15/2020 16:24   NM PET Image Initial (PI) Skull Base To Thigh  Result Date: 04/30/2020 CLINICAL DATA:  Initial treatment strategy for non-small-cell lung cancer. EXAM: NUCLEAR MEDICINE PET SKULL BASE TO THIGH TECHNIQUE: 12 mCi F-18 FDG was injected intravenously. Full-ring PET imaging was performed from the skull base to thigh after the radiotracer. CT data was obtained and used for attenuation correction and anatomic localization. Fasting blood glucose: 96 mg/dl COMPARISON:  Chest CT 03/11/2020 FINDINGS: Mediastinal blood pool activity: SUV max 2.3 Liver activity: SUV max NA NECK: No hypermetabolic lymph nodes in the neck. Incidental CT findings: none CHEST: Right parahilar lung mass is markedly hypermetabolic with SUV max = 216.1 Central photopenia in the lesion is compatible with necrosis. Hypermetabolic tissue extends from this dominant mass into the right hilum. Numerous tiny satellite nodules evident on CT imaging. 11 mm nodule more cranially in the right upper lobe (17/8) shows low level hypermetabolism with SUV max = 1.4. No hypermetabolic mediastinal or left hilar metastatic lymphadenopathy. No evidence for hypermetabolic axillary lymphadenopathy. Incidental CT findings: Coronary artery calcification is evident. Atherosclerotic calcification is noted in the wall of the thoracic aorta. Centrilobular emphsyema noted. 4 mm left upper lobe nodule identified on 68/4. This is stable since prior diagnostic chest CT in appeared calcified on that exam compatible with granuloma. ABDOMEN/PELVIS: No abnormal hypermetabolic activity within the liver, pancreas, left adrenal gland, or spleen. No hypermetabolic lymph nodes in the  abdomen or pelvis. Right adrenal mass is progressive in the interval since prior CT, measuring 6.5 cm today compared to 5.4 cm previously. This lesion is markedly hypermetabolic with SUV max = 14 and demonstrates central necrosis. Incidental CT findings: 18 mm calcified gallstone noted in the neck of the gallbladder. Small duodenal diverticulum noted. There is abdominal aortic atherosclerosis without aneurysm. Small left groin hernia contains only fat. SKELETON: No focal hypermetabolic activity to suggest skeletal metastasis. Incidental CT findings: No worrisome lytic or sclerotic osseous abnormality. IMPRESSION: 1. Right parahilar mass lesion is markedly hypermetabolic consistent with primary bronchogenic neoplasm. The hypermetabolic soft tissue from this lesion extends into the upper right hilum. No evidence for hypermetabolic metastases in the neck, mediastinum or left hilum. 2. Numerous tiny satellite nodules in the right lung below threshold for resolution on PET imaging. Dominant additional right lung nodule measuring 11 mm in the right upper lobe shows only low level FDG accumulation. 3. Interval progression of right adrenal mass, now measuring 6.5 cm and demonstrating marked hypermetabolism, highly suspicious for metastatic disease. 4. No other findings to suggest hypermetabolic metastases in the abdomen or pelvis. 5. Cholelithiasis. 6.  Emphysema. (ICD10-J43.9) 7.  Aortic Atherosclerois (ICD10-170.0) Electronically Signed   By: ERandall Hiss  Tery Sanfilippo M.D.   On: 04/30/2020 11:04   CT Biopsy  Result Date: 05/06/2020 INDICATION: Right adrenal mass, history squamous cell lung carcinoma EXAM: CT-GUIDED BIOPSY RIGHT ADRENAL MASS MEDICATIONS: 1% LIDOCAINE LOCAL ANESTHESIA/SEDATION: Moderate Sedation Time:  NONE. The patient was continuously monitored during the procedure by the interventional radiology nurse under my direct supervision. PROCEDURE: The procedure, risks, benefits, and alternatives were explained to the  patient. Questions regarding the procedure were encouraged and answered. The patient understands and consents to the procedure. previous imaging reviewed. patient positioned right side down decubitus. noncontrast localization ct performed. the right adrenal mass was localized and marked for a posterior paraspinous approach. under sterile conditions and local anesthesia, a 17 gauge 11.8 cm guide was advanced from a posterior approach to the right adrenal mass. Needle position confirmed with CT. 18 gauge core biopsies obtained. Samples placed in formalin. Sampling was strandy and fragmented. Needle removed. Postprocedure imaging demonstrates a trace amount of surrounding retroperitoneal hemorrhage. Patient tolerated the procedure well without complication. Vital sign monitoring by nursing staff during the procedure will continue as patient is in the special procedures unit for post procedure observation. FINDINGS: The images document guide needle placement within the right adrenal mass. Post biopsy images demonstrate trace surrounding retroperitoneal hemorrhage. COMPLICATIONS: None immediate. IMPRESSION: Successful CT-guided core biopsy of the right adrenal mass Electronically Signed   By: [name].  Shick M.D.   On: 05/06/2020 13:42     ASSESSMENT/PLAN:  This is a very pleasant 60 year old Caucasian male recently diagnosed with a stage IV (T4, N2, M1C) non-small cell lung cancer, squamous cell carcinoma presented with large right upper lobe lung mass in addition to a satellite nodule in the right upper lobe and few other subcentimeter nodules in addition to right hilar and mediastinal lymphadenopathy and metastatic disease to the right adrenal gland mass diagnosed in February 2022.  He has PD-L1 expression of 70%.  He is currently undergoing palliative radiotherapy to the large right upper lobe lung mass and mediastinal lymphadenopathy under the care of Dr. [name]. The last dose is expected on 06/04/20.   He is  currently undergoing systemic chemotherapy with carboplatin for AUC of 5, paclitaxel 175 mg/M2 and Keytruda 200 mg IV every 3 weeks with Neulasta support. He is status post 1 cycle and tolerated it _  The patient was seen with Dr. [name]. Labs were reviewed. Recommend that he _.   Discuss that biopsy was positive?  We will see him back for a follow up visit in 2 weeks for evaluation before starting cycle #2.   The patient was advised to call immediately if he has any concerning symptoms in the interval. The patient voices understanding of current disease status and treatment options and is in agreement with the current care plan. All questions were answered. The patient knows to call the clinic with any problems, questions or concerns. We can certainly see the patient much sooner if necessary     No orders of the defined types were placed in this encounter.    I spent {CHL ONC TIME VISIT - QZESP:[id number]} counseling the patient face to face. The total time spent in the appointment was {CHL ONC TIME VISIT - JFHLK:[id number]}.  Milea Klink L Abdi Husak, PA-C 05/09/20

## 2020-05-12 ENCOUNTER — Other Ambulatory Visit: Payer: Self-pay

## 2020-05-12 ENCOUNTER — Inpatient Hospital Stay (HOSPITAL_COMMUNITY): Payer: 59

## 2020-05-12 ENCOUNTER — Emergency Department (HOSPITAL_COMMUNITY): Payer: 59

## 2020-05-12 ENCOUNTER — Ambulatory Visit: Payer: 59

## 2020-05-12 ENCOUNTER — Encounter (HOSPITAL_COMMUNITY): Payer: Self-pay

## 2020-05-12 ENCOUNTER — Inpatient Hospital Stay (HOSPITAL_COMMUNITY)
Admission: EM | Admit: 2020-05-12 | Discharge: 2020-05-20 | DRG: 871 | Disposition: A | Discharge status: Home / Self Care | Payer: 59 | Attending: Internal Medicine | Admitting: Internal Medicine

## 2020-05-12 DIAGNOSIS — M069 Rheumatoid arthritis, unspecified: Secondary | ICD-10-CM | POA: Diagnosis present

## 2020-05-12 DIAGNOSIS — I8290 Acute embolism and thrombosis of unspecified vein: Secondary | ICD-10-CM

## 2020-05-12 DIAGNOSIS — R5081 Fever presenting with conditions classified elsewhere: Secondary | ICD-10-CM

## 2020-05-12 DIAGNOSIS — Z8616 Personal history of COVID-19: Secondary | ICD-10-CM

## 2020-05-12 DIAGNOSIS — R197 Diarrhea, unspecified: Secondary | ICD-10-CM

## 2020-05-12 DIAGNOSIS — R7881 Bacteremia: Secondary | ICD-10-CM

## 2020-05-12 DIAGNOSIS — D701 Agranulocytosis secondary to cancer chemotherapy: Secondary | ICD-10-CM | POA: Diagnosis not present

## 2020-05-12 DIAGNOSIS — I4891 Unspecified atrial fibrillation: Secondary | ICD-10-CM | POA: Diagnosis not present

## 2020-05-12 DIAGNOSIS — D703 Neutropenia due to infection: Secondary | ICD-10-CM | POA: Diagnosis not present

## 2020-05-12 DIAGNOSIS — E876 Hypokalemia: Secondary | ICD-10-CM | POA: Diagnosis present

## 2020-05-12 DIAGNOSIS — Z6832 Body mass index (BMI) 32.0-32.9, adult: Secondary | ICD-10-CM

## 2020-05-12 DIAGNOSIS — F32A Depression, unspecified: Secondary | ICD-10-CM | POA: Diagnosis present

## 2020-05-12 DIAGNOSIS — E871 Hypo-osmolality and hyponatremia: Secondary | ICD-10-CM | POA: Diagnosis present

## 2020-05-12 DIAGNOSIS — D6959 Other secondary thrombocytopenia: Secondary | ICD-10-CM | POA: Diagnosis present

## 2020-05-12 DIAGNOSIS — D709 Neutropenia, unspecified: Secondary | ICD-10-CM

## 2020-05-12 DIAGNOSIS — C7971 Secondary malignant neoplasm of right adrenal gland: Secondary | ICD-10-CM | POA: Diagnosis present

## 2020-05-12 DIAGNOSIS — I951 Orthostatic hypotension: Secondary | ICD-10-CM | POA: Diagnosis present

## 2020-05-12 DIAGNOSIS — K219 Gastro-esophageal reflux disease without esophagitis: Secondary | ICD-10-CM | POA: Diagnosis present

## 2020-05-12 DIAGNOSIS — I480 Paroxysmal atrial fibrillation: Secondary | ICD-10-CM | POA: Diagnosis present

## 2020-05-12 DIAGNOSIS — B9620 Unspecified Escherichia coli [E. coli] as the cause of diseases classified elsewhere: Secondary | ICD-10-CM | POA: Diagnosis present

## 2020-05-12 DIAGNOSIS — A4151 Sepsis due to Escherichia coli [E. coli]: Principal | ICD-10-CM | POA: Diagnosis present

## 2020-05-12 DIAGNOSIS — T451X5A Adverse effect of antineoplastic and immunosuppressive drugs, initial encounter: Secondary | ICD-10-CM

## 2020-05-12 DIAGNOSIS — B965 Pseudomonas (aeruginosa) (mallei) (pseudomallei) as the cause of diseases classified elsewhere: Secondary | ICD-10-CM

## 2020-05-12 DIAGNOSIS — I2699 Other pulmonary embolism without acute cor pulmonale: Secondary | ICD-10-CM | POA: Diagnosis present

## 2020-05-12 DIAGNOSIS — E222 Syndrome of inappropriate secretion of antidiuretic hormone: Secondary | ICD-10-CM

## 2020-05-12 DIAGNOSIS — C3491 Malignant neoplasm of unspecified part of right bronchus or lung: Secondary | ICD-10-CM | POA: Diagnosis not present

## 2020-05-12 DIAGNOSIS — M7989 Other specified soft tissue disorders: Secondary | ICD-10-CM | POA: Diagnosis not present

## 2020-05-12 DIAGNOSIS — E861 Hypovolemia: Secondary | ICD-10-CM | POA: Diagnosis present

## 2020-05-12 DIAGNOSIS — R1310 Dysphagia, unspecified: Secondary | ICD-10-CM | POA: Diagnosis present

## 2020-05-12 DIAGNOSIS — R531 Weakness: Secondary | ICD-10-CM | POA: Diagnosis present

## 2020-05-12 DIAGNOSIS — D649 Anemia, unspecified: Secondary | ICD-10-CM | POA: Diagnosis present

## 2020-05-12 DIAGNOSIS — E669 Obesity, unspecified: Secondary | ICD-10-CM | POA: Diagnosis present

## 2020-05-12 DIAGNOSIS — R6520 Severe sepsis without septic shock: Secondary | ICD-10-CM | POA: Diagnosis present

## 2020-05-12 DIAGNOSIS — I270 Primary pulmonary hypertension: Secondary | ICD-10-CM

## 2020-05-12 DIAGNOSIS — E860 Dehydration: Secondary | ICD-10-CM | POA: Diagnosis present

## 2020-05-12 DIAGNOSIS — Z79899 Other long term (current) drug therapy: Secondary | ICD-10-CM

## 2020-05-12 DIAGNOSIS — Z8249 Family history of ischemic heart disease and other diseases of the circulatory system: Secondary | ICD-10-CM

## 2020-05-12 DIAGNOSIS — A0472 Enterocolitis due to Clostridium difficile, not specified as recurrent: Secondary | ICD-10-CM | POA: Diagnosis not present

## 2020-05-12 DIAGNOSIS — Z20822 Contact with and (suspected) exposure to covid-19: Secondary | ICD-10-CM | POA: Diagnosis present

## 2020-05-12 DIAGNOSIS — K1230 Oral mucositis (ulcerative), unspecified: Secondary | ICD-10-CM | POA: Diagnosis not present

## 2020-05-12 DIAGNOSIS — Z87891 Personal history of nicotine dependence: Secondary | ICD-10-CM

## 2020-05-12 HISTORY — DX: Neutropenia, unspecified: D70.9

## 2020-05-12 HISTORY — DX: Malignant neoplasm of unspecified part of unspecified bronchus or lung: C34.90

## 2020-05-12 HISTORY — DX: Unspecified atrial fibrillation: I48.91

## 2020-05-12 HISTORY — DX: Neutropenia, unspecified: R50.81

## 2020-05-12 LAB — CBC WITH DIFFERENTIAL/PLATELET
Abs Immature Granulocytes: 0.01 10*3/uL (ref 0.00–0.07)
Basophils Absolute: 0 10*3/uL (ref 0.0–0.1)
Basophils Relative: 0 %
Eosinophils Absolute: 0.1 10*3/uL (ref 0.0–0.5)
Eosinophils Relative: 19 %
HCT: 32.8 % — ABNORMAL LOW (ref 39.0–52.0)
Hemoglobin: 10.8 g/dL — ABNORMAL LOW (ref 13.0–17.0)
Immature Granulocytes: 3 %
Lymphocytes Relative: 40 %
Lymphs Abs: 0.1 10*3/uL — ABNORMAL LOW (ref 0.7–4.0)
MCH: 26.3 pg (ref 26.0–34.0)
MCHC: 32.9 g/dL (ref 30.0–36.0)
MCV: 79.8 fL — ABNORMAL LOW (ref 80.0–100.0)
Monocytes Absolute: 0.1 10*3/uL (ref 0.1–1.0)
Monocytes Relative: 38 %
Neutro Abs: 0 10*3/uL — CL (ref 1.7–7.7)
Neutrophils Relative %: 0 %
Platelets: 117 10*3/uL — ABNORMAL LOW (ref 150–400)
RBC: 4.11 MIL/uL — ABNORMAL LOW (ref 4.22–5.81)
RDW: 14.3 % (ref 11.5–15.5)
WBC: 0.3 10*3/uL — CL (ref 4.0–10.5)
nRBC: 0 % (ref 0.0–0.2)

## 2020-05-12 LAB — BASIC METABOLIC PANEL
Anion gap: 11 (ref 5–15)
Anion gap: 9 (ref 5–15)
BUN: 21 mg/dL — ABNORMAL HIGH (ref 6–20)
BUN: 17 mg/dL (ref 6–20)
CO2: 21 mmol/L — ABNORMAL LOW (ref 22–32)
CO2: 25 mmol/L (ref 22–32)
Calcium: 8.2 mg/dL — ABNORMAL LOW (ref 8.9–10.3)
Calcium: 7.4 mg/dL — ABNORMAL LOW (ref 8.9–10.3)
Chloride: 94 mmol/L — ABNORMAL LOW (ref 98–111)
Chloride: 96 mmol/L — ABNORMAL LOW (ref 98–111)
Creatinine, Ser: 1.16 mg/dL (ref 0.61–1.24)
Creatinine, Ser: 0.92 mg/dL (ref 0.61–1.24)
GFR, Estimated: >60 mL/min (ref >60)
GFR, Estimated: >60 mL/min (ref >60)
Glucose, Bld: 135 mg/dL — ABNORMAL HIGH (ref 70–99)
Glucose, Bld: 133 mg/dL — ABNORMAL HIGH (ref 70–99)
Potassium: 3.8 mmol/L (ref 3.5–5.1)
Potassium: 3.6 mmol/L (ref 3.5–5.1)
Sodium: 126 mmol/L — ABNORMAL LOW (ref 135–145)
Sodium: 130 mmol/L — ABNORMAL LOW (ref 135–145)

## 2020-05-12 LAB — ECHOCARDIOGRAM COMPLETE
Area-P 1/2: 5.54 cm2
Height: 71.5 in
S' Lateral: 3.9 cm
Weight: 3744 oz

## 2020-05-12 LAB — D-DIMER, QUANTITATIVE: D-Dimer, Quant: 2.91 ug/mL-FEU — ABNORMAL HIGH (ref 0.00–0.50)

## 2020-05-12 LAB — HEPATIC FUNCTION PANEL
ALT: 14 U/L (ref 0–44)
AST: 17 U/L (ref 15–41)
Albumin: 3.2 g/dL — ABNORMAL LOW (ref 3.5–5.0)
Alkaline Phosphatase: 82 U/L (ref 38–126)
Bilirubin, Direct: 1.3 mg/dL — ABNORMAL HIGH (ref 0.0–0.2)
Indirect Bilirubin: 1.5 mg/dL — ABNORMAL HIGH (ref 0.3–0.9)
Total Bilirubin: 2.8 mg/dL — ABNORMAL HIGH (ref 0.3–1.2)
Total Protein: 6.7 g/dL (ref 6.5–8.1)

## 2020-05-12 LAB — URINALYSIS, ROUTINE W REFLEX MICROSCOPIC
Bilirubin Urine: NEGATIVE
Glucose, UA: NEGATIVE mg/dL
Hgb urine dipstick: NEGATIVE
Ketones, ur: NEGATIVE mg/dL
Leukocytes,Ua: NEGATIVE
Nitrite: NEGATIVE
Protein, ur: NEGATIVE mg/dL
Specific Gravity, Urine: 1.023 (ref 1.005–1.030)
pH: 6 (ref 5.0–8.0)

## 2020-05-12 LAB — TROPONIN I (HIGH SENSITIVITY)
Troponin I (High Sensitivity): 7 ng/L (ref <18)
Troponin I (High Sensitivity): 7 ng/L (ref <18)
Troponin I (High Sensitivity): 8 ng/L (ref <18)
Troponin I (High Sensitivity): 7 ng/L (ref <18)

## 2020-05-12 LAB — PROTIME-INR
INR: 1.1 (ref 0.8–1.2)
Prothrombin Time: 13.8 seconds (ref 11.4–15.2)

## 2020-05-12 LAB — HIV ANTIBODY (ROUTINE TESTING W REFLEX): HIV Screen 4th Generation wRfx: NONREACTIVE

## 2020-05-12 LAB — SARS CORONAVIRUS 2 (TAT 6-24 HRS): SARS Coronavirus 2: NEGATIVE

## 2020-05-12 LAB — APTT: aPTT: 35 seconds (ref 24–36)

## 2020-05-12 LAB — LACTIC ACID, PLASMA: Lactic Acid, Venous: 2.2 mmol/L (ref 0.5–1.9)

## 2020-05-12 MED ORDER — ACETAMINOPHEN 325 MG PO TABS
650.0000 mg | ORAL_TABLET | ORAL | Status: DC | PRN
  Administered 2020-05-12: 650 mg via ORAL
  Filled 2020-05-12: qty 2

## 2020-05-12 MED ORDER — MAGIC MOUTHWASH W/LIDOCAINE
5.0000 mL | Freq: Three times a day (TID) | ORAL | Status: DC
  Administered 2020-05-12 – 2020-05-14 (×6): 5 mL via ORAL
  Filled 2020-05-12 (×22): qty 5

## 2020-05-12 MED ORDER — ENOXAPARIN SODIUM 40 MG/0.4ML ~~LOC~~ SOLN
40.0000 mg | Freq: Every day | SUBCUTANEOUS | Status: DC
  Administered 2020-05-12 – 2020-05-13 (×2): 40 mg via SUBCUTANEOUS
  Filled 2020-05-12 (×2): qty 0.4

## 2020-05-12 MED ORDER — ONDANSETRON HCL 4 MG/2ML IJ SOLN: 4.0000 mg | Freq: Four times a day (QID) | INTRAMUSCULAR | Status: DC | PRN

## 2020-05-12 MED ORDER — APIXABAN 5 MG PO TABS
5.0000 mg | ORAL_TABLET | Freq: Once | ORAL | Status: AC
  Administered 2020-05-12: 5 mg via ORAL
  Filled 2020-05-12: qty 1

## 2020-05-12 MED ORDER — PIPERACILLIN-TAZOBACTAM 3.375 G IVPB 30 MIN
3.3750 g | Freq: Once | INTRAVENOUS | Status: AC
  Administered 2020-05-12: 3.375 g via INTRAVENOUS
  Filled 2020-05-12 (×2): qty 50

## 2020-05-12 MED ORDER — HEPARIN (PORCINE) 25000 UT/250ML-% IV SOLN
1600.0000 [IU]/h | INTRAVENOUS | Status: DC
  Filled 2020-05-12: qty 250

## 2020-05-12 MED ORDER — SODIUM CHLORIDE 0.9 % IV SOLN: INTRAVENOUS | Status: DC

## 2020-05-12 MED ORDER — LOPERAMIDE HCL 2 MG PO CAPS
2.0000 mg | ORAL_CAPSULE | Freq: Four times a day (QID) | ORAL | Status: DC | PRN
  Administered 2020-05-12: 2 mg via ORAL
  Filled 2020-05-12: qty 1

## 2020-05-12 MED ORDER — IOHEXOL 350 MG/ML SOLN
100.0000 mL | Freq: Once | INTRAVENOUS | Status: AC | PRN
  Administered 2020-05-12: 100 mL via INTRAVENOUS

## 2020-05-12 MED ORDER — DILTIAZEM LOAD VIA INFUSION
20.0000 mg | Freq: Once | INTRAVENOUS | Status: AC
  Administered 2020-05-12: 20 mg via INTRAVENOUS
  Filled 2020-05-12: qty 20

## 2020-05-12 MED ORDER — HEPARIN BOLUS VIA INFUSION
4000.0000 [IU] | Freq: Once | INTRAVENOUS | Status: DC
  Filled 2020-05-12: qty 4000

## 2020-05-12 MED ORDER — PIPERACILLIN-TAZOBACTAM 3.375 G IVPB
3.3750 g | Freq: Three times a day (TID) | INTRAVENOUS | Status: DC
  Administered 2020-05-12 – 2020-05-13 (×2): 3.375 g via INTRAVENOUS
  Filled 2020-05-12 (×2): qty 50

## 2020-05-12 MED ORDER — CALCIUM GLUCONATE-NACL 1-0.675 GM/50ML-% IV SOLN
1.0000 g | Freq: Once | INTRAVENOUS | Status: AC
  Administered 2020-05-12: 1000 mg via INTRAVENOUS
  Filled 2020-05-12: qty 50

## 2020-05-12 MED ORDER — APIXABAN 5 MG PO TABS: 5.0000 mg | ORAL_TABLET | Freq: Two times a day (BID) | ORAL | Status: DC

## 2020-05-12 MED ORDER — PAROXETINE HCL 10 MG PO TABS
10.0000 mg | ORAL_TABLET | Freq: Every day | ORAL | Status: DC
  Administered 2020-05-12 – 2020-05-20 (×9): 10 mg via ORAL
  Filled 2020-05-12 (×10): qty 1

## 2020-05-12 MED ORDER — SODIUM CHLORIDE 0.9 % IV SOLN: Freq: Once | INTRAVENOUS | Status: AC

## 2020-05-12 MED ORDER — DILTIAZEM HCL-DEXTROSE 125-5 MG/125ML-% IV SOLN (PREMIX)
5.0000 mg/h | INTRAVENOUS | Status: DC
  Administered 2020-05-12: 12.5 mg/h via INTRAVENOUS
  Administered 2020-05-12: 5 mg/h via INTRAVENOUS
  Administered 2020-05-13 (×2): 12.5 mg/h via INTRAVENOUS
  Administered 2020-05-14 (×2): 10 mg/h via INTRAVENOUS
  Filled 2020-05-12 (×7): qty 125

## 2020-05-12 NOTE — Discharge Instructions (Addendum)
Atrial Fibrillation  Atrial fibrillation is a type of heartbeat that is irregular or fast. If you have this condition, your heart beats without any order. This makes it hard for your heart to pump blood in a normal way. Atrial fibrillation may come and go, or it may become a long-lasting problem. If this condition is not treated, it can put you at higher risk for stroke, heart failure, and other heart problems. What are the causes? This condition may be caused by diseases that damage the heart. They include:  High blood pressure.  Heart failure.  Heart valve disease.  Heart surgery. Other causes include:  Diabetes.  Thyroid disease.  Being overweight.  Kidney disease. Sometimes the cause is not known. What increases the risk? You are more likely to develop this condition if:  You are older.  You smoke.  You exercise often and very hard.  You have a family history of this condition.  You are a man.  You use drugs.  You drink a lot of alcohol.  You have lung conditions, such as emphysema, pneumonia, or COPD.  You have sleep apnea. What are the signs or symptoms? Common symptoms of this condition include:  A feeling that your heart is beating very fast.  Chest pain or discomfort.  Feeling short of breath.  Suddenly feeling light-headed or weak.  Getting tired easily during activity.  Fainting.  Sweating. In some cases, there are no symptoms. How is this treated? Treatment for this condition depends on underlying conditions and how you feel when you have atrial fibrillation. They include:  Medicines to: ? Prevent blood clots. ? Treat heart rate or heart rhythm problems.  Using devices, such as a pacemaker, to correct heart rhythm problems.  Doing surgery to remove the part of the heart that sends bad signals.  Closing an area where clots can form in the heart (left atrial appendage). In some cases, your doctor will treat other underlying  conditions. Follow these instructions at home: Medicines  Take over-the-counter and prescription medicines only as told by your doctor.  Do not take any new medicines without first talking to your doctor.  If you are taking blood thinners: ? Talk with your doctor before you take any medicines that have aspirin or NSAIDs, such as ibuprofen, in them. ? Take your medicine exactly as told by your doctor. Take it at the same time each day. ? Avoid activities that could hurt or bruise you. Follow instructions about how to prevent falls. ? Wear a bracelet that says you are taking blood thinners. Or, carry a card that lists what medicines you take. Lifestyle  Do not use any products that have nicotine or tobacco in them. These include cigarettes, e-cigarettes, and chewing tobacco. If you need help quitting, ask your doctor.  Eat heart-healthy foods. Talk with your doctor about the right eating plan for you.  Exercise regularly as told by your doctor.  Do not drink alcohol.  Lose weight if you are overweight.  Do not use drugs, including cannabis.      General instructions  If you have a condition that causes breathing to stop for a short period of time (apnea), treat it as told by your doctor.  Keep a healthy weight. Do not use diet pills unless your doctor says they are safe for you. Diet pills may make heart problems worse.  Keep all follow-up visits as told by your doctor. This is important. Contact a doctor if:  You notice a  change in the speed, rhythm, or strength of your heartbeat.  You are taking a blood-thinning medicine and you get more bruising.  You get tired more easily when you move or exercise.  You have a sudden change in weight. Get help right away if:  You have pain in your chest or your belly (abdomen).  You have trouble breathing.  You have side effects of blood thinners, such as blood in your vomit, poop (stool), or pee (urine), or bleeding that cannot  stop.  You have any signs of a stroke. "BE FAST" is an easy way to remember the main warning signs: ? B - Balance. Signs are dizziness, sudden trouble walking, or loss of balance. ? E - Eyes. Signs are trouble seeing or a change in how you see. ? F - Face. Signs are sudden weakness or loss of feeling in the face, or the face or eyelid drooping on one side. ? A - Arms. Signs are weakness or loss of feeling in an arm. This happens suddenly and usually on one side of the body. ? S - Speech. Signs are sudden trouble speaking, slurred speech, or trouble understanding what people say. ? T - Time. Time to call emergency services. Write down what time symptoms started.  You have other signs of a stroke, such as: ? A sudden, very bad headache with no known cause. ? Feeling like you may vomit (nausea). ? Vomiting. ? A seizure. These symptoms may be an emergency. Do not wait to see if the symptoms will go away. Get medical help right away. Call your local emergency services (911 in the [place] Do not drive yourself to the hospital.   Summary  Atrial fibrillation is a type of heartbeat that is irregular or fast.  You are at higher risk of this condition if you smoke, are older, have diabetes, or are overweight.  Follow your doctor's instructions about medicines, diet, exercise, and follow-up visits.  Get help right away if you have signs or symptoms of a stroke.  Get help right away if you cannot catch your breath, or you have chest pain or discomfort. This information is not intended to replace advice given to you by your health care provider. Make sure you discuss any questions you have with your health care provider. Document Revised: 07/26/2018 Document Reviewed: 07/26/2018 Elsevier Patient Education  2021 [place] Difficile Infection Clostridioides difficile infection, or C. diff, is an infection that is caused by C. diff germs (bacteria). This infection may happen  after you take antibiotics that kill other germs and let C. diff germs grow. C. diff can be spread from person to person (is contagious). What are the causes?  Taking certain antibiotics.  Coming in contact with people, food, or things that have C. diff. What increases the risk?  Taking certain antibiotics for a long time.  Staying in a hospital or long-term care facility for a long time.  Being age 40 or older.  Having had C. diff before or been exposed to C. diff.  Having a weak disease-fighting system (immune system).  Taking medicines that treat stomach acid.  Having serious health problems, including: ? Colon cancer. ? Inflammatory bowel disease (IBD).  Having had a procedure or surgery on your digestive system. What are the signs or symptoms?  Watery poop (diarrhea).  Fever.  Not feeling hungry.  Feeling like you may vomit.  Swelling, pain, cramps, or a tender belly. How is this treated? Treatment may  include:  Stopping the antibiotics that caused the C. diff infection.  Taking antibiotics that kill C. diff.  Placing poop from a healthy person into your colon (fecal transplant).  Doing surgery to take out the infected part of the colon. Follow these instructions at home: Medicines  Take over-the-counter and prescription medicines only as told by your doctor.  Take antibiotic medicine as told by your doctor. Do not stop taking it even if you start to feel better.  Do not take medicines to treat watery poop unless your doctor tells you to. Eating and drinking  Follow instructions from your doctor about what to eat and drink. This may include eating bland foods in small amounts, such as: ? Bananas. ? Applesauce. ? Rice. ? Lean meats. ? Toast. ? Crackers.  To prevent loss of fluid in your body (dehydration): ? Take in enough fluids to keep your pee pale yellow. This includes water, ice chips, clear fruit juice with water added to it, or low-calorie  sports drinks. ? Take an ORS (oral rehydration solution). This drink is sold in pharmacies and retail stores.  Avoid milk, caffeine, and alcohol.   General instructions  Wash your hands often with soap and water. Do this for at least 20 seconds.  Take a bath or shower every day.  Return to your normal activities when your doctor says that it is safe.  Keep all follow-up visits. How is this prevented? Personal hygiene  Wash your hands often with soap and water. Do this for at least 20 seconds.  Wash your hands before you cook and after you use the bathroom.  Other people should wash their hands too, especially: ? People who live with you. ? People who visit you in a hospital or clinic.   Contact precautions  If you get watery poop while you are in the hospital or a long-term care facility, tell your doctor right away.  When you visit someone in the hospital or a long-term care facility, wear a gown, gloves, or other protection.  If possible: ? Stay away from people who have diarrhea. ? Use a separate bathroom if you are sick and live with other people. Clean environment  Keep your home clean. ? Clean your home every day for at least a week after you leave the hospital.  Clean surfaces that you touch every day. Use a product that has a 10% chlorine bleach solution. Be sure to: ? Read the label on your product to make sure that the product will kill the germs on your surfaces. ? Clean toilets and flush handles, bathtubs, sinks, doorknobs and handles, countertops, and work surfaces.  If you are in the hospital, make sure the surfaces in your room are cleaned each day. Tell someone right away if body fluids have splashed or spilled. Clothes and linens  Wash clothes and linens using laundry soap that has chlorine bleach. Be sure to: ? Use powder soap instead of liquid. ? Clean your washing machine once a month. To do this, turn on the hot setting with only soap in it. Contact  a doctor if:  Your symptoms do not get better or they get worse.  Your symptoms go away and then come back.  You have a fever.  You have new symptoms. Get help right away if:  Your belly is more tender or you have more pain.  Your poop is mostly bloody.  Your poop looks black.  You vomit after you eat or drink.  You  have signs of not having enough fluids in your body. These include: ? Dark yellow pee, very little pee, or no pee. ? Cracked lips or dry mouth. ? No tears when you cry. ? Sunken eyes. ? Feeling sleepy. ? Feeling weak or dizzy. Summary  C. diff infection is an infection that may happen after you take antibiotic medicines.  Symptoms include watery poop, fever, not feeling hungry, or feeling like you may vomit.  Treatment includes stopping the antibiotics that made you sick and taking antibiotics that kill the C. diff germs. Poop from a healthy person may also be placed into your colon.  To prevent C. diff infectionfrom spreading, wash hands often with soap and water. Do this for at least 20 seconds. Keep your home clean. This information is not intended to replace advice given to you by your health care provider. Make sure you discuss any questions you have with your health care provider. Document Revised: 05/24/2019 Document Reviewed: 05/24/2019 Elsevier Patient Education  [place].

## 2020-05-12 NOTE — ED Notes (Signed)
ED TO INPATIENT HANDOFF REPORT  Name/Age/Gender John [name] 60 y.o. male  Code Status   Home/SNF/Other Home  Chief Complaint Atrial fibrillation with RVR ([place]) [I48.91]  Level of Care/Admitting Diagnosis ED Disposition    ED Disposition Condition Comment   Admit  Hospital Area: [facility] [[id number]]  Level of Care: Telemetry [5]  Admit to tele based on following criteria: Complex arrhythmia (Bradycardia/Tachycardia)  May admit patient to [facility] or [facility] if equivalent level of care is available:: Yes  Covid Evaluation: Asymptomatic Screening Protocol (No Symptoms)  Diagnosis: Atrial fibrillation with RVR [facility]) [[id number]]  Admitting Physician: Eben Burow [[id number]]  Attending Physician: Eben Burow [[id number]]  Estimated length of stay: past midnight tomorrow  Certification:: I certify this patient will need inpatient services for at least 2 midnights       Medical History Past Medical History:  Diagnosis Date  . Dyspnea   . Family history of adverse reaction to anesthesia    brother with seizures had episode under anesthesia.  Patient has seizures as well.  [name] GERD (gastroesophageal reflux disease)   . Rheumatoid aortitis   . Squamous cell lung cancer ([place])     Allergies No Known Allergies  IV Location/Drains/Wounds Patient Lines/Drains/Airways Status    Active Line/Drains/Airways    Name Placement date Placement time Site Days   Peripheral IV 05/12/20 Right Antecubital 05/12/20  0250  Antecubital  less than 1   Peripheral IV 05/12/20 Left Hand 05/12/20  0610  Hand  less than 1          Labs/Imaging Results for orders placed or performed during the hospital encounter of 05/12/20 (from the past 48 hour(s))  CBC with Differential     Status: Abnormal   Collection Time: 05/12/20  2:44 AM  Result Value Ref Range   WBC 0.3 (LL) 4.0 - 10.5 K/uL    Comment: REPEATED TO VERIFY WHITE COUNT CONFIRMED ON SMEAR THIS  CRITICAL RESULT HAS VERIFIED AND BEEN CALLED TO Ethel Rana, RN BY TURNER,SHAWANA ON 03 28 2022 AT 0333, AND HAS BEEN READ BACK.     RBC 4.11 (L) 4.22 - 5.81 MIL/uL   Hemoglobin 10.8 (L) 13.0 - 17.0 g/dL   HCT 32.8 (L) 39.0 - 52.0 %   MCV 79.8 (L) 80.0 - 100.0 fL   MCH 26.3 26.0 - 34.0 pg   MCHC 32.9 30.0 - 36.0 g/dL   RDW 14.3 11.5 - 15.5 %   Platelets 117 (L) 150 - 400 K/uL    Comment: SPECIMEN CHECKED FOR CLOTS Immature Platelet Fraction may be clinically indicated, consider ordering this additional test [id number] REPEATED TO VERIFY PLATELET COUNT CONFIRMED BY SMEAR    nRBC 0.0 0.0 - 0.2 %   Neutrophils Relative % 0 %   Neutro Abs 0.0 (LL) 1.7 - 7.7 K/uL    Comment: REPEATED TO VERIFY THIS CRITICAL RESULT HAS VERIFIED AND BEEN CALLED TO Ethel Rana, RN BY TURNER,SHAWANA ON 03 28 2022 AT 0333, AND HAS BEEN READ BACK.     Lymphocytes Relative 40 %   Lymphs Abs 0.1 (L) 0.7 - 4.0 K/uL   Monocytes Relative 38 %   Monocytes Absolute 0.1 0.1 - 1.0 K/uL   Eosinophils Relative 19 %   Eosinophils Absolute 0.1 0.0 - 0.5 K/uL   Basophils Relative 0 %   Basophils Absolute 0.0 0.0 - 0.1 K/uL   Immature Granulocytes 3 %   Abs Immature Granulocytes 0.01 0.00 - 0.07 K/uL  Comment: Performed at [facility], [place] [address], [place], [place] [address]  Basic metabolic panel     Status: Abnormal   Collection Time: 05/12/20  2:44 AM  Result Value Ref Range   Sodium 126 (L) 135 - 145 mmol/L   Potassium 3.8 3.5 - 5.1 mmol/L   Chloride 94 (L) 98 - 111 mmol/L   CO2 21 (L) 22 - 32 mmol/L   Glucose, Bld 135 (H) 70 - 99 mg/dL    Comment: Glucose reference range applies only to samples taken after fasting for at least 8 hours.   BUN 21 (H) 6 - 20 mg/dL   Creatinine, Ser 1.16 0.61 - 1.24 mg/dL   Calcium 8.2 (L) 8.9 - 10.3 mg/dL   GFR, Estimated >60 >60 mL/min    Comment: (NOTE) Calculated using the CKD-EPI Creatinine Equation (2021)    Anion gap 11 5 - 15     Comment: Performed at [facility], [place] [address] [place], [place] [address]  Troponin I (High Sensitivity)     Status: None   Collection Time: 05/12/20  2:44 AM  Result Value Ref Range   Troponin I (High Sensitivity) 7 <18 ng/L    Comment: (NOTE) Elevated high sensitivity troponin I (hsTnI) values and significant  changes across serial measurements may suggest ACS but many other  chronic and acute conditions are known to elevate hsTnI results.  Refer to the "Links" section for chest pain algorithms and additional  guidance. Performed at [facility], [place] [address] [place], [place] [address]   D-dimer, quantitative     Status: Abnormal   Collection Time: 05/12/20  2:44 AM  Result Value Ref Range   D-Dimer, Quant 2.91 (H) 0.00 - 0.50 ug/mL-FEU    Comment: (NOTE) At the manufacturer cut-off value of 0.5 g/mL FEU, this assay has a negative predictive value of 95-100%.This assay is intended for use in conjunction with a clinical pretest probability (PTP) assessment model to exclude pulmonary embolism (PE) and deep venous thrombosis (DVT) in outpatients suspected of PE or DVT. Results should be correlated with clinical presentation. Performed at [facility], [place] [address], [place], [place] [address]   Hepatic function panel     Status: Abnormal   Collection Time: 05/12/20  2:54 AM  Result Value Ref Range   Total Protein 6.7 6.5 - 8.1 g/dL   Albumin 3.2 (L) 3.5 - 5.0 g/dL   AST 17 15 - 41 U/L   ALT 14 0 - 44 U/L   Alkaline Phosphatase 82 38 - 126 U/L   Total Bilirubin 2.8 (H) 0.3 - 1.2 mg/dL   Bilirubin, Direct 1.3 (H) 0.0 - 0.2 mg/dL   Indirect Bilirubin 1.5 (H) 0.3 - 0.9 mg/dL    Comment: Performed at [facility], [place] [address], [place], [place] [address]  Troponin I (High Sensitivity)     Status: None   Collection Time: 05/12/20  4:44 AM  Result Value Ref Range   Troponin I (High Sensitivity)  7 <18 ng/L    Comment: (NOTE) Elevated high sensitivity troponin I (hsTnI) values and significant  changes across serial measurements may suggest ACS but many other  chronic and acute conditions are known to elevate hsTnI results.  Refer to the "Links" section for chest pain algorithms and additional  guidance. Performed at [facility], [place] [address], [place], [place] [address]   Urinalysis, Routine w reflex microscopic Nasopharyngeal Swab  Status: None   Collection Time: 05/12/20  5:05 AM  Result Value Ref Range   Color, Urine YELLOW YELLOW   APPearance CLEAR CLEAR   Specific Gravity, Urine 1.023 1.005 - 1.030   pH 6.0 5.0 - 8.0   Glucose, UA NEGATIVE NEGATIVE mg/dL   Hgb urine dipstick NEGATIVE NEGATIVE   Bilirubin Urine NEGATIVE NEGATIVE   Ketones, ur NEGATIVE NEGATIVE mg/dL   Protein, ur NEGATIVE NEGATIVE mg/dL   Nitrite NEGATIVE NEGATIVE   Leukocytes,Ua NEGATIVE NEGATIVE    Comment: Performed at [facility], [place] [address] [place],  [address]   CT Angio Chest PE W and/or Wo Contrast  Result Date: 05/12/2020 CLINICAL DATA:  Lung cancer following initiation of chemotherapy 1 week prior., Elevated D-dimer, weakness. EXAM: CT ANGIOGRAPHY CHEST WITH CONTRAST TECHNIQUE: Multidetector CT imaging of the chest was performed using the standard protocol during bolus administration of intravenous contrast. Multiplanar CT image reconstructions and MIPs were obtained to evaluate the vascular anatomy. CONTRAST:  123mL OMNIPAQUE IOHEXOL 350 MG/ML SOLN COMPARISON:  None. FINDINGS: Cardiovascular: There is adequate opacification of the pulmonary arterial tree. There is an infra luminal filling defect identified within the right lower lobe pulmonary artery which is eccentric and appears confluent with the adjacent pulmonary mass and likely reflects either direct invasion of the neoplasm into the right lower lobar pulmonary artery or mural irregularity  secondary to neoplastic infiltration with secondary in situ thrombosis. No intraluminal filling defect, however, is identified to suggest pulmonary thromboembolism. The central pulmonary arteries are of normal caliber. No significant coronary artery calcification. Global cardiac size within normal limits. No pericardial effusion. Mild atherosclerotic calcification within the thoracic aorta. No aortic aneurysm. Mediastinum/Nodes: Thyroid unremarkable. As noted on prior PET CT examination of 04/29/2020, the mass is confluent with the right hilum. No additional pathologic thoracic adenopathy. The esophagus is unremarkable. Lungs/Pleura: Hypermetabolic cavitary mass is again identified conflict with right hila measuring roughly 4.5 x 7.1 cm at axial image # 59/6. Small satellite nodule again identified within the right upper lobe peripherally at axial image # 35/6 measuring 10 mm. Lungs are otherwise clear. No pneumothorax or pleural effusion. The mass obliterates the posterior segmental bronchus of the right upper lobe. Upper Abdomen: Limited images of the upper abdomen demonstrate the right adrenal metastasis partially, which measures at least 5.0 x 6.8 cm. Cholelithiasis again noted. Musculoskeletal: No lytic or blastic bone lesions are identified. Review of the MIP images confirms the above findings. IMPRESSION: Intraluminal nonocclusive filling defect within the right lower lobe pulmonary artery in keeping with eccentric mural thrombus or tumor thrombus related to probable direct invasion of the vessel by the adjacent neoplastic mass. No CT evidence of thromboembolic disease involving the pulmonary arterial tree. Stable cavitary mass within the right upper lobe obliterating the posterior segmental bronchus of the right upper lobe in keeping with the known primary neoplasm. This appears confluent with the right hilar nodal station. Small satellite nodule within the right upper lobe again noted. Right adrenal  metastasis partially visualized. Cholelithiasis Aortic Atherosclerosis (ICD10-I70.0). Electronically Signed   By: Fidela Salisbury MD   On: 05/12/2020 04:46   DG Chest Portable 1 View  Result Date: 05/12/2020 CLINICAL DATA:  Weakness and tachycardia EXAM: PORTABLE CHEST 1 VIEW COMPARISON:  04/29/2020 FINDINGS: Cardiac shadows within normal limits. Lungs are well aerated bilaterally. Persistent central mass is noted along the minor fissure stable from the prior exam. Additional and nodule is noted in the right upper lobe stable from the  prior CT examination. No other focal abnormality is seen. IMPRESSION: Stable right-sided lung masses. Electronically Signed   By: Inez Catalina M.D.   On: 05/12/2020 03:11    Pending Labs Unresulted Labs (From admission, onward)          Start     Ordered   05/12/20 0508  APTT  ONCE - STAT,   STAT        05/12/20 0508   05/12/20 0508  Protime-INR  ONCE - STAT,   STAT        05/12/20 0508   05/12/20 0500  SARS CORONAVIRUS 2 (TAT 6-24 HRS) Nasopharyngeal Nasopharyngeal Swab  (Tier 3 - Symptomatic/asymptomatic with Precautions)  Once,   STAT       Question Answer Comment  Is this test for diagnosis or screening Screening   Symptomatic for COVID-19 as defined by CDC No   Hospitalized for COVID-19 No   Admitted to ICU for COVID-19 No   Previously tested for COVID-19 Unknown   Resident in a congregate (group) care setting No   Employed in healthcare setting No   Has patient completed COVID vaccination(s) (2 doses of Pfizer/Moderna 1 dose of [name]) Yes   Has patient completed COVID Booster / 3rd dose Yes      05/12/20 0459   Signed and Held  HIV Antibody (routine testing w rflx)  (HIV Antibody (Routine testing w reflex) panel)  Once,   R        Signed and Held   Signed and Held  Basic metabolic panel  Tomorrow morning,   R        Signed and Held   Signed and Held  CBC  Tomorrow morning,   R        Signed and Held   Signed and Held  Basic metabolic  panel  Once-Timed,   R        Signed and Held          Vitals/Pain Today's Vitals   05/12/20 0445 05/12/20 0530 05/12/20 0630 05/12/20 0645  BP: 100/68 118/68 101/65 96/64  Pulse: 97 97 (!) 111 (!) 109  Resp: 18 18 19 18   Temp:      TempSrc:      SpO2: 97% 97% 97% 99%  Weight:      Height:      PainSc:   0-No pain     Isolation Precautions Airborne and Contact precautions  Medications Medications  diltiazem (CARDIZEM) 1 mg/mL load via infusion 20 mg (20 mg Intravenous Bolus from Bag 05/12/20 0250)    And  diltiazem (CARDIZEM) 125 mg in dextrose 5% 125 mL (1 mg/mL) infusion (12.5 mg/hr Intravenous Infusion Verify 05/12/20 0418)  calcium gluconate 1 g/ 50 mL sodium chloride IVPB (0 g Intravenous Stopped 05/12/20 0418)  0.9 %  sodium chloride infusion ( Intravenous New Bag/Given 05/12/20 0416)  iohexol (OMNIPAQUE) 350 MG/ML injection 100 mL (100 mLs Intravenous Contrast Given 05/12/20 0359)  apixaban (ELIQUIS) tablet 5 mg (5 mg Oral Given 05/12/20 9937)    Mobility Standby due to weakness

## 2020-05-12 NOTE — H&P (Signed)
History and Physical    John [name] WER:[id number] DOB: 12/17/1960 DOA: 05/12/2020  PCP: [name], MD   Patient coming from: Home  Chief Complaint: Generalized weakness  HPI: [name] is a 60 y.o. male with medical history significant for Squamous cell carcinoma of lung, RA who presents with generalized weakness. He has received three courses of radiation therapy and he started chemotherapy last week.  He has had generalized weakness for the last 2 days which has become more severe to the point where he could not even stand up.  He states that if he does stand up he gets lightheaded.  He has not had any syncope.  He denies any chest pain or palpitations.  Does state that became diaphoretic with the weakness and trying to move around the house.  Been having diarrhea since he had the chemotherapy but no nausea or vomiting.  When he arrived in the emergency room he was found to have a heart rate in the 190s with atrial fibrillation with RVR on EKG.  ED Course: In the emergency room he is found to have atrial fibrillation with RVR and was started on Cardizem infusion.  Had elevated D-dimer and CT angiography was obtained.  He does not have a pulmonary embolism but he does have a thrombus in the right pulmonary artery nonocclusive.  Patient also found to have low sodium level of 123 and is neutropenic.  Hospitalist service was asked to admit for further management  Review of Systems:  General: Reports generalized weakness and lightheaded when stands up. Denies fever, chills, weight loss, night sweats. Denies change in appetite HENT: Denies head trauma, headache, denies change in hearing, tinnitus.  Denies nasal bleeding.  Denies sore throat, sores in mouth.  Denies difficulty swallowing Eyes: Denies blurry vision, pain in eye, drainage.  Denies discoloration of eyes. Neck: Denies pain.  Denies swelling.  Denies pain with movement. Cardiovascular: Denies chest pain, palpitations.  Denies  edema.  Denies orthopnea Respiratory: Denies shortness of breath, cough.  Denies wheezing.  Denies sputum production Gastrointestinal: Denies abdominal pain, swelling.  Denies nausea, vomiting.  Denies melena.  Denies hematemesis. Musculoskeletal: Denies limitation of movement.  Denies deformity or swelling.  Denies pain.   Genitourinary: Denies pelvic pain.  Denies urinary frequency or hesitancy.  Denies dysuria.  Skin: Denies rash.  Denies petechiae, purpura, ecchymosis. Neurological: Denies headache. Denies syncope. Denies seizure activity. Denies paresthesia. Denies slurred speech, drooping face.  Denies visual change. Psychiatric: Denies depression, anxiety. Denies hallucinations.  Past Medical History:  Diagnosis Date  . Dyspnea   . Family history of adverse reaction to anesthesia    brother with seizures had episode under anesthesia.  Patient has seizures as well.  [name] GERD (gastroesophageal reflux disease)   . Rheumatoid aortitis   . Squamous cell lung cancer [facility])     Past Surgical History:  Procedure Laterality Date  . BRONCHIAL BRUSHINGS  03/27/2020   Procedure: BRONCHIAL BRUSHINGS;  Surgeon: [name], MD;  Location: [facility] ENDOSCOPY;  Service: Cardiopulmonary;;  . EYE SURGERY    . FINE NEEDLE ASPIRATION  03/27/2020   Procedure: FINE NEEDLE ASPIRATION;  Surgeon: [name], MD;  Location: [facility] ENDOSCOPY;  Service: Cardiopulmonary;;  . HIP SURGERY Left   . VIDEO BRONCHOSCOPY WITH ENDOBRONCHIAL ULTRASOUND N/A 03/27/2020   Procedure: VIDEO BRONCHOSCOPY WITH ENDOBRONCHIAL ULTRASOUND;  Surgeon: [name], MD;  Location: [place];  Service: Cardiopulmonary;  Laterality: N/A;    Social History  reports that he quit smoking  about 4 years ago. His smoking use included cigarettes. He has a 30.00 pack-year smoking history. He has never used smokeless tobacco. He reports previous alcohol use. He reports that he does not use drugs.  No Known Allergies  History reviewed.  No pertinent family history.   Prior to Admission medications   Medication Sig Start Date End Date Taking? Authorizing Provider  acetaminophen (TYLENOL) 500 MG tablet Take 500 mg by mouth every 6 (six) hours as needed for mild pain or moderate pain.   Yes [provider]  CARBOplatin 1000 MG/100ML SOLN Inject into the vein.   Yes [provider]  leflunomide (ARAVA) 20 MG tablet Take 20 mg by mouth at bedtime. 02/21/20  Yes [provider]  PACLitaxel 100 MG/16.67ML CONC Inject into the vein.   Yes [provider]  PARoxetine (PAXIL) 10 MG tablet Take 10 mg by mouth daily. 01/25/20  Yes [provider]  pegfilgrastim-bmez Tyson Dense) 6 MG/0.6ML injection Inject 0.6 mL into the skin 2 days after chemotherapy 05/05/20  Yes [name], [name], PA-C  PRILOSEC OTC 20 MG tablet Take 20 mg by mouth at bedtime. 11/13/19  Yes [provider]  prochlorperazine (COMPAZINE) 10 MG tablet Take 1 tablet (10 mg total) by mouth every 6 (six) hours as needed for nausea or vomiting. 04/28/20  Yes Curt Bears, MD  SIMPONI 50 MG/0.5ML SOAJ Inject 50 mg into the skin every 30 (thirty) days. 03/23/20  Yes [provider]  sodium chloride 0.9 % SOLN 50 mL with pembrolizumab 100 MG/4ML SOLN 2 mg/kg Inject 2 mg/kg into the vein.   Yes [provider]    Physical Exam: Vitals:   05/12/20 0417 05/12/20 0430 05/12/20 0445 05/12/20 0530  BP: 113/69 111/63 100/68 118/68  Pulse: (!) 114 100 97 97  Resp: 20 16 18 18   Temp:      TempSrc:      SpO2: 98% 97% 97% 97%  Weight:      Height:        Constitutional: NAD, calm, comfortable Vitals:   05/12/20 0417 05/12/20 0430 05/12/20 0445 05/12/20 0530  BP: 113/69 111/63 100/68 118/68  Pulse: (!) 114 100 97 97  Resp: 20 16 18 18   Temp:      TempSrc:      SpO2: 98% 97% 97% 97%  Weight:      Height:       General: WDWN, Alert and oriented x3.  Eyes: EOMI, PERRL, conjunctivae normal.   Sclera nonicteric HENT:  [place]/AT, external ears normal.  Nares patent without epistasis.  Mucous membranes are dry. Posterior pharynx clear of any exudate or lesions.  Neck: Soft, normal range of motion, supple, no masses, no thyromegaly.  Trachea midline Respiratory: clear to auscultation bilaterally, no wheezing, no crackles. Normal respiratory effort. No accessory muscle use.  Cardiovascular: Irregularly irregular rhythm with tachycardia, no murmurs / rubs / gallops. No extremity edema. 2+ pedal pulses.  Abdomen: Soft, no tenderness, nondistended, no rebound or guarding.  No masses palpated. Bowel sounds normoactive Musculoskeletal: FROM. no cyanosis. No joint deformity upper and lower extremities. Normal muscle tone.  Skin: Warm, dry, intact no rashes, lesions, ulcers. No induration Neurologic: CN 2-12 grossly intact.  Normal speech.  Sensation intact. Strength 4/5 in all extremities.   Psychiatric: Normal judgment and insight.  Normal mood.    Labs on Admission: I have personally reviewed following labs and imaging studies  CBC: Recent Labs  Lab 05/05/20 0745 05/06/20 0930 05/12/20 0244  WBC 2.1* 3.7* 0.3*  NEUTROABS 1.1*  --  0.0*  HGB 11.1* 11.6* 10.8*  HCT 34.3* 35.3* 32.8*  MCV 82.1 81.7 79.8*  PLT 215 266 117*    Basic Metabolic Panel: Recent Labs  Lab 05/05/20 0745 05/12/20 0244  NA 136 126*  K 3.5 3.8  CL 105 94*  CO2 24 21*  GLUCOSE 128* 135*  BUN 8 21*  CREATININE 0.90 1.16  CALCIUM 8.7* 8.2*    GFR: Estimated Creatinine Clearance: 86.7 mL/min (by C-G formula based on SCr of 1.16 mg/dL).  Liver Function Tests: Recent Labs  Lab 05/05/20 0745 05/12/20 0254  AST 16 17  ALT 13 14  ALKPHOS 98 82  BILITOT 0.9 2.8*  PROT 7.1 6.7  ALBUMIN 3.1* 3.2*    Urine analysis:    Component Value Date/Time   COLORURINE YELLOW 05/12/2020 0505   APPEARANCEUR CLEAR 05/12/2020 0505   LABSPEC 1.023 05/12/2020 0505   PHURINE 6.0 05/12/2020 0505   GLUCOSEU NEGATIVE  05/12/2020 0505   HGBUR NEGATIVE 05/12/2020 0505   BILIRUBINUR NEGATIVE 05/12/2020 0505   KETONESUR NEGATIVE 05/12/2020 0505   PROTEINUR NEGATIVE 05/12/2020 0505   NITRITE NEGATIVE 05/12/2020 0505   LEUKOCYTESUR NEGATIVE 05/12/2020 0505    Radiological Exams on Admission: CT Angio Chest PE W and/or Wo Contrast  Result Date: 05/12/2020 CLINICAL DATA:  Lung cancer following initiation of chemotherapy 1 week prior., Elevated D-dimer, weakness. EXAM: CT ANGIOGRAPHY CHEST WITH CONTRAST TECHNIQUE: Multidetector CT imaging of the chest was performed using the standard protocol during bolus administration of intravenous contrast. Multiplanar CT image reconstructions and MIPs were obtained to evaluate the vascular anatomy. CONTRAST:  126mL OMNIPAQUE IOHEXOL 350 MG/ML SOLN COMPARISON:  None. FINDINGS: Cardiovascular: There is adequate opacification of the pulmonary arterial tree. There is an infra luminal filling defect identified within the right lower lobe pulmonary artery which is eccentric and appears confluent with the adjacent pulmonary mass and likely reflects either direct invasion of the neoplasm into the right lower lobar pulmonary artery or mural irregularity secondary to neoplastic infiltration with secondary in situ thrombosis. No intraluminal filling defect, however, is identified to suggest pulmonary thromboembolism. The central pulmonary arteries are of normal caliber. No significant coronary artery calcification. Global cardiac size within normal limits. No pericardial effusion. Mild atherosclerotic calcification within the thoracic aorta. No aortic aneurysm. Mediastinum/Nodes: Thyroid unremarkable. As noted on prior PET CT examination of 04/29/2020, the mass is confluent with the right hilum. No additional pathologic thoracic adenopathy. The esophagus is unremarkable. Lungs/Pleura: Hypermetabolic cavitary mass is again identified conflict with right hila measuring roughly 4.5 x 7.1 cm at axial  image # 59/6. Small satellite nodule again identified within the right upper lobe peripherally at axial image # 35/6 measuring 10 mm. Lungs are otherwise clear. No pneumothorax or pleural effusion. The mass obliterates the posterior segmental bronchus of the right upper lobe. Upper Abdomen: Limited images of the upper abdomen demonstrate the right adrenal metastasis partially, which measures at least 5.0 x 6.8 cm. Cholelithiasis again noted. Musculoskeletal: No lytic or blastic bone lesions are identified. Review of the MIP images confirms the above findings. IMPRESSION: Intraluminal nonocclusive filling defect within the right lower lobe pulmonary artery in keeping with eccentric mural thrombus or tumor thrombus related to probable direct invasion of the vessel by the adjacent neoplastic mass. No CT evidence of thromboembolic disease involving the pulmonary arterial tree. Stable cavitary mass within the right upper lobe obliterating the posterior segmental bronchus of the right upper lobe in keeping  with the known primary neoplasm. This appears confluent with the right hilar nodal station. Small satellite nodule within the right upper lobe again noted. Right adrenal metastasis partially visualized. Cholelithiasis Aortic Atherosclerosis (ICD10-I70.0). Electronically Signed   By: Fidela Salisbury MD   On: 05/12/2020 04:46   DG Chest Portable 1 View  Result Date: 05/12/2020 CLINICAL DATA:  Weakness and tachycardia EXAM: PORTABLE CHEST 1 VIEW COMPARISON:  04/29/2020 FINDINGS: Cardiac shadows within normal limits. Lungs are well aerated bilaterally. Persistent central mass is noted along the minor fissure stable from the prior exam. Additional and nodule is noted in the right upper lobe stable from the prior CT examination. No other focal abnormality is seen. IMPRESSION: Stable right-sided lung masses. Electronically Signed   By: Inez Catalina M.D.   On: 05/12/2020 03:11    EKG: Independently reviewed.  EKG shows  atrial fibrillation with RVR with occasional PVCs.  No acute ST elevation or depression.  QTc 448  Assessment/Plan Principal Problem:   Atrial fibrillation with RVR  Active Problems:   Pulmonary artery thrombosis  Anticoagulated with Eliquis.  First of Eliquis was provided in the emergency room    Stage 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  Followed by oncology.  Has had radiation therapy and started chemotherapy last week.    Hyponatremia IV fluid hydration provided. Recheck sodium level this afternoon. Check electrolytes renal function in morning with labs.    Neutropenia Patient placed on neutropenic precautions. Check CBC in morning   DVT prophylaxis:     patient is started on Eliquis for therapeutic anticoagulation with atrial fibrillation Code Status:   Full code Family Communication:  Diagnosis and plan discussed with patient and his wife who is at bedside.  Questions were answered.  Further recommendations to follow as clinical indicated Disposition Plan:   Patient is from:  Home  Anticipated DC to:  Home  Anticipated DC date:  Anticipate more than 2 midnights in hospital to treat acute condition  Anticipated DC barriers: No barriers to discharge identified at this time   Admission status:  Inpatient   Yevonne Aline Rebeckah Masih MD [facility]  How to contact the [facility] Attending or Consulting provider [place] or covering provider during after hours [place], for this patient?   1. Check the care team in [facility] and look for a) attending/consulting [facility] provider listed and b) the [facility] team listed 2. Log into [email or web address] and use [place]'s universal password to access. If you do not have the password, please contact the hospital operator. 3. Locate the [facility] provider you are looking for under [facility] and page to a number that you can be directly reached. 4. If you still have difficulty reaching the provider, please page the [facility] (Director on Call) for the  Hospitalists listed on amion for assistance.  05/12/2020, 6:14 AM

## 2020-05-12 NOTE — Progress Notes (Signed)
HEMATOLOGY-ONCOLOGY PROGRESS NOTE  SUBJECTIVE: [name] is followed by our office for stage IIIb/IV non-small cell lung cancer, squamous cell carcinoma.  He is currently receiving palliative radiotherapy to his large right upper lobe lung mass and mediastinal lymphadenopathy under the care of Dr. [name].  He is receiving systemic chemotherapy with carboplatin for an AUC of 5, paclitaxel 175 mg meter squared, and Keytruda 200 mg IV every 3 weeks.  He receives Neulasta with his chemotherapy.  The patient received his first cycle of chemotherapy on 05/05/2020.  It appears as though the patient's insurance requires self injection of G-CSF at home.   The patient presented to the hospital with generalized weakness.  He was having lightheadedness and difficulty standing due to the lightheadedness.  He was having diarrhea following chemotherapy.  Heart rate was noted to be in the 190s and he had A. fib with RVR on EKG.  On admission, his WBC was 0.3, hemoglobin 10.8, weight is 117,000.  His sodium was low at 126.  Hepatic function panel showed a T bili of 2.8, direct bili of 1.3, and indirect bili of 1.5.  A CTA chest was performed which showed an intraluminal nonocclusive filling defect within the right lower lobe pulmonary artery in keeping with eccentric mural thrombus or tumor thrombus related to probable direct invasion of the vessel by the adjacent neoplastic mass, stable cavitary mass within the right upper lobe obliterating the posterior segmental bronchus of the right upper lobe in keeping with the known primary neoplasm.  The patient was seen and examined today.  His wife is at the bedside.  He was noted to have a fever of 100.4 this morning.  Cultures have been obtained and he has been started on IV antibiotics.  He reports overall weakness.  Still having loose stools.  I confirmed with the patient and his wife that he did take his G-CSF at home on 05/07/2020.  Oncology History  Stage IV squamous cell  carcinoma of right lung ([place])  04/10/2020 Initial Diagnosis   Stage 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 ([place])   04/10/2020 Cancer Staging   Staging form: Lung, AJCC 8th Edition - Clinical: Stage IVB (cT4, cN2, cM1c) - Signed by Curt Bears, MD on 04/10/2020   05/05/2020 -  Chemotherapy    Patient is on Treatment Plan: LUNG NSCLC CARBOPLATIN + PACLITAXEL + PEMBROLIZUMAB Q21D X 4 CYCLES / PEMBROLIZUMAB MAINTENANCE Q21D         REVIEW OF SYSTEMS:   Constitutional: He reports fevers, no chills Eyes: Denies blurriness of vision Ears, nose, mouth, throat, and face: He reports a sore throat. Respiratory: Denies cough, dyspnea or wheezes Cardiovascular: Denies palpitation, chest discomfort Gastrointestinal: Denies nausea, vomiting.  Reports loose stools. Skin: Denies abnormal skin rashes Lymphatics: Denies new lymphadenopathy or easy bruising Neurological:Denies numbness, tingling or new weaknesses Behavioral/Psych: Mood is stable, no new changes  Extremities: No lower extremity edema All other systems were reviewed with the patient and are negative.  I have reviewed the past medical history, past surgical history, social history and family history with the patient and they are unchanged from previous note.   PHYSICAL EXAMINATION: ECOG PERFORMANCE STATUS: 2 - Symptomatic, <50% confined to bed  Vitals:   05/12/20 0715 05/12/20 0853  BP: 111/68 111/69  Pulse: (!) 102 (!) 114  Resp: 17 18  Temp:  (!) 100.4 F (38 C)  SpO2: 95% 93%   Filed Weights   05/12/20 0231  Weight: 106.1 kg    Intake/Output  from previous day: 03/27 0701 - 03/28 0700 In: 79.9 [I.V.:29.9; IV Piggyback:50] Out: -   GENERAL:alert, no distress and comfortable OROPHARYNX:no exudate, no erythema and lips, buccal mucosa, and tongue normal  LUNGS: clear to auscultation and percussion with normal breathing effort HEART: Tachycardic, no lower extremity edema ABDOMEN:abdomen soft, non-tender and normal  bowel sounds NEURO: alert & oriented x 3 with fluent speech, no focal motor/sensory deficits  LABORATORY DATA:  I have reviewed the data as listed CMP Latest Ref Rng & Units 05/12/2020 05/05/2020 04/17/2020  Glucose 70 - 99 mg/dL 135(H) 128(H) 95  BUN 6 - 20 mg/dL 21(H) 8 7  Creatinine 0.61 - 1.24 mg/dL 1.16 0.90 0.90  Sodium 135 - 145 mmol/L 126(L) 136 135  Potassium 3.5 - 5.1 mmol/L 3.8 3.5 4.0  Chloride 98 - 111 mmol/L 94(L) 105 101  CO2 22 - 32 mmol/L 21(L) 24 25  Calcium 8.9 - 10.3 mg/dL 8.2(L) 8.7(L) 8.7(L)  Total Protein 6.5 - 8.1 g/dL 6.7 7.1 7.2  Total Bilirubin 0.3 - 1.2 mg/dL 2.8(H) 0.9 0.8  Alkaline Phos 38 - 126 U/L 82 98 118  AST 15 - 41 U/L '17 16 16  ' ALT 0 - 44 U/L '14 13 11    ' Lab Results  Component Value Date   WBC 0.3 (LL) 05/12/2020   HGB 10.8 (L) 05/12/2020   HCT 32.8 (L) 05/12/2020   MCV 79.8 (L) 05/12/2020   PLT 117 (L) 05/12/2020   NEUTROABS 0.0 (LL) 05/12/2020    DG Eye Foreign Body  Result Date: 04/15/2020 CLINICAL DATA:  Metal working/exposure; clearance prior to MRI EXAM: ORBITS FOR FOREIGN BODY - 2 VIEW COMPARISON:  None. FINDINGS: There is no evidence of metallic foreign body within the orbits. No significant bone abnormality identified. IMPRESSION: No evidence of metallic foreign body within the orbits. Electronically Signed   By: Rolm Baptise M.D.   On: 04/15/2020 12:05   CT Angio Chest PE W and/or Wo Contrast  Result Date: 05/12/2020 CLINICAL DATA:  Lung cancer following initiation of chemotherapy 1 week prior., Elevated D-dimer, weakness. EXAM: CT ANGIOGRAPHY CHEST WITH CONTRAST TECHNIQUE: Multidetector CT imaging of the chest was performed using the standard protocol during bolus administration of intravenous contrast. Multiplanar CT image reconstructions and MIPs were obtained to evaluate the vascular anatomy. CONTRAST:  123m OMNIPAQUE IOHEXOL 350 MG/ML SOLN COMPARISON:  None. FINDINGS: Cardiovascular: There is adequate opacification of the pulmonary  arterial tree. There is an infra luminal filling defect identified within the right lower lobe pulmonary artery which is eccentric and appears confluent with the adjacent pulmonary mass and likely reflects either direct invasion of the neoplasm into the right lower lobar pulmonary artery or mural irregularity secondary to neoplastic infiltration with secondary in situ thrombosis. No intraluminal filling defect, however, is identified to suggest pulmonary thromboembolism. The central pulmonary arteries are of normal caliber. No significant coronary artery calcification. Global cardiac size within normal limits. No pericardial effusion. Mild atherosclerotic calcification within the thoracic aorta. No aortic aneurysm. Mediastinum/Nodes: Thyroid unremarkable. As noted on prior PET CT examination of 04/29/2020, the mass is confluent with the right hilum. No additional pathologic thoracic adenopathy. The esophagus is unremarkable. Lungs/Pleura: Hypermetabolic cavitary mass is again identified conflict with right hila measuring roughly 4.5 x 7.1 cm at axial image # 59/6. Small satellite nodule again identified within the right upper lobe peripherally at axial image # 35/6 measuring 10 mm. Lungs are otherwise clear. No pneumothorax or pleural effusion. The mass obliterates the posterior segmental  bronchus of the right upper lobe. Upper Abdomen: Limited images of the upper abdomen demonstrate the right adrenal metastasis partially, which measures at least 5.0 x 6.8 cm. Cholelithiasis again noted. Musculoskeletal: No lytic or blastic bone lesions are identified. Review of the MIP images confirms the above findings. IMPRESSION: Intraluminal nonocclusive filling defect within the right lower lobe pulmonary artery in keeping with eccentric mural thrombus or tumor thrombus related to probable direct invasion of the vessel by the adjacent neoplastic mass. No CT evidence of thromboembolic disease involving the pulmonary arterial  tree. Stable cavitary mass within the right upper lobe obliterating the posterior segmental bronchus of the right upper lobe in keeping with the known primary neoplasm. This appears confluent with the right hilar nodal station. Small satellite nodule within the right upper lobe again noted. Right adrenal metastasis partially visualized. Cholelithiasis Aortic Atherosclerosis (ICD10-I70.0). Electronically Signed   By: Fidela Salisbury MD   On: 05/12/2020 04:46   MR BRAIN W WO CONTRAST  Result Date: 04/15/2020 CLINICAL DATA:  Non-small-cell lung cancer staging EXAM: MRI HEAD WITHOUT AND WITH CONTRAST TECHNIQUE: Multiplanar, multiecho pulse sequences of the brain and surrounding structures were obtained without and with intravenous contrast. CONTRAST:  73m GADAVIST GADOBUTROL 1 MMOL/ML IV SOLN COMPARISON:  None. FINDINGS: Brain: No acute infarction, hemorrhage, hydrocephalus, extra-axial collection or mass lesion. Scattered small deep white matter hyperintensities bilaterally likely chronic microvascular ischemia. No enhancing metastatic deposits identified. Vascular: Normal arterial flow voids. Skull and upper cervical spine: No focal skeletal lesion. Sinuses/Orbits: Mild mucosal edema paranasal sinuses. Bilateral cataract extraction. Other: None IMPRESSION: Negative for metastatic disease to the brain. Electronically Signed   By: CFranchot GalloM.D.   On: 04/15/2020 16:24   NM PET Image Initial (PI) Skull Base To Thigh  Result Date: 04/30/2020 CLINICAL DATA:  Initial treatment strategy for non-small-cell lung cancer. EXAM: NUCLEAR MEDICINE PET SKULL BASE TO THIGH TECHNIQUE: 12 mCi F-18 FDG was injected intravenously. Full-ring PET imaging was performed from the skull base to thigh after the radiotracer. CT data was obtained and used for attenuation correction and anatomic localization. Fasting blood glucose: 96 mg/dl COMPARISON:  Chest CT 03/11/2020 FINDINGS: Mediastinal blood pool activity: SUV max 2.3 Liver  activity: SUV max NA NECK: No hypermetabolic lymph nodes in the neck. Incidental CT findings: none CHEST: Right parahilar lung mass is markedly hypermetabolic with SUV max = 207.3 Central photopenia in the lesion is compatible with necrosis. Hypermetabolic tissue extends from this dominant mass into the right hilum. Numerous tiny satellite nodules evident on CT imaging. 11 mm nodule more cranially in the right upper lobe (17/8) shows low level hypermetabolism with SUV max = 1.4. No hypermetabolic mediastinal or left hilar metastatic lymphadenopathy. No evidence for hypermetabolic axillary lymphadenopathy. Incidental CT findings: Coronary artery calcification is evident. Atherosclerotic calcification is noted in the wall of the thoracic aorta. Centrilobular emphsyema noted. 4 mm left upper lobe nodule identified on 68/4. This is stable since prior diagnostic chest CT in appeared calcified on that exam compatible with granuloma. ABDOMEN/PELVIS: No abnormal hypermetabolic activity within the liver, pancreas, left adrenal gland, or spleen. No hypermetabolic lymph nodes in the abdomen or pelvis. Right adrenal mass is progressive in the interval since prior CT, measuring 6.5 cm today compared to 5.4 cm previously. This lesion is markedly hypermetabolic with SUV max = 14 and demonstrates central necrosis. Incidental CT findings: 18 mm calcified gallstone noted in the neck of the gallbladder. Small duodenal diverticulum noted. There is abdominal aortic atherosclerosis without aneurysm.  Small left groin hernia contains only fat. SKELETON: No focal hypermetabolic activity to suggest skeletal metastasis. Incidental CT findings: No worrisome lytic or sclerotic osseous abnormality. IMPRESSION: 1. Right parahilar mass lesion is markedly hypermetabolic consistent with primary bronchogenic neoplasm. The hypermetabolic soft tissue from this lesion extends into the upper right hilum. No evidence for hypermetabolic metastases in the  neck, mediastinum or left hilum. 2. Numerous tiny satellite nodules in the right lung below threshold for resolution on PET imaging. Dominant additional right lung nodule measuring 11 mm in the right upper lobe shows only low level FDG accumulation. 3. Interval progression of right adrenal mass, now measuring 6.5 cm and demonstrating marked hypermetabolism, highly suspicious for metastatic disease. 4. No other findings to suggest hypermetabolic metastases in the abdomen or pelvis. 5. Cholelithiasis. 6.  Emphysema. (ICD10-J43.9) 7.  Aortic Atherosclerois (ICD10-170.0) Electronically Signed   By: Misty Stanley M.D.   On: 04/30/2020 11:04   CT Biopsy  Result Date: 05/06/2020 INDICATION: Right adrenal mass, history squamous cell lung carcinoma EXAM: CT-GUIDED BIOPSY RIGHT ADRENAL MASS MEDICATIONS: 1% LIDOCAINE LOCAL ANESTHESIA/SEDATION: Moderate Sedation Time:  NONE. The patient was continuously monitored during the procedure by the interventional radiology nurse under my direct supervision. PROCEDURE: The procedure, risks, benefits, and alternatives were explained to the patient. Questions regarding the procedure were encouraged and answered. The patient understands and consents to the procedure. previous imaging reviewed. patient positioned right side down decubitus. noncontrast localization ct performed. the right adrenal mass was localized and marked for a posterior paraspinous approach. under sterile conditions and local anesthesia, a 17 gauge 11.8 cm guide was advanced from a posterior approach to the right adrenal mass. Needle position confirmed with CT. 18 gauge core biopsies obtained. Samples placed in formalin. Sampling was strandy and fragmented. Needle removed. Postprocedure imaging demonstrates a trace amount of surrounding retroperitoneal hemorrhage. Patient tolerated the procedure well without complication. Vital sign monitoring by nursing staff during the procedure will continue as patient is in the  special procedures unit for post procedure observation. FINDINGS: The images document guide needle placement within the right adrenal mass. Post biopsy images demonstrate trace surrounding retroperitoneal hemorrhage. COMPLICATIONS: None immediate. IMPRESSION: Successful CT-guided core biopsy of the right adrenal mass Electronically Signed   By: [name].  Shick M.D.   On: 05/06/2020 13:42   DG Chest Portable 1 View  Result Date: 05/12/2020 CLINICAL DATA:  Weakness and tachycardia EXAM: PORTABLE CHEST 1 VIEW COMPARISON:  04/29/2020 FINDINGS: Cardiac shadows within normal limits. Lungs are well aerated bilaterally. Persistent central mass is noted along the minor fissure stable from the prior exam. Additional and nodule is noted in the right upper lobe stable from the prior CT examination. No other focal abnormality is seen. IMPRESSION: Stable right-sided lung masses. Electronically Signed   By: Inez Catalina M.D.   On: 05/12/2020 03:11    ASSESSMENT AND PLAN: This is a very pleasant 60 year old white male recently diagnosed with a stage IV (T4, N2, M1 C) non-small cell lung cancer, squamous cell carcinoma presented with large right upper lobe lung mass in addition to a satellite nodule in the right upper lobe and few other subcentimeter nodules in addition to right hilar and mediastinal lymphadenopathy and suspicious right adrenal gland mass diagnosed in February 2022.  He has PD-L1 expression of 70%. The patient is currently undergoing palliative radiotherapy to the large right upper lobe lung mass and mediastinal lymphadenopathy. He started his first cycle of systemic chemotherapy with carboplatin for AUC of 5,  paclitaxel 175 mg/M2 and Keytruda 200 mg IV every 3 weeks with Neulasta support on 05/05/2020.  I confirmed with the patient that he took Neulasta at home on 05/07/2020.    He is now admitted secondary to generalized weakness was found to have A. fib with RVR.  CT scan results were reviewed and discussed  with the patient.  His lung mass overall appears stable. He continues to receive radiation under the care of Dr. [name].  Will defer to radiation oncology about whether they plan to continue radiation while he is in the hospital.  He was also found to have a nonocclusive filling defect which does not represent a primary pulmonary thromboembolism.  We do not recommend anticoagulation.  The patient is neutropenic due to recent chemotherapy.  He already received G-CSF.  Dissipate that his WBC will slowly recover.  I note that he is febrile.  Agree with obtaining blood cultures, urine culture, and chest x-ray as well starting him on broad-spectrum antibiotics.  For the sore throat, he may use Magic mouthwash.  Management of atrial fibrillation per hospitalist.   LOS: 0 days   John Bussing, DNP, AGPCNP-BC, AOCNP 05/12/20

## 2020-05-12 NOTE — ED Provider Notes (Signed)
[facility] DEPT Provider Note: John Spurling, MD, FACEP  CSN: [id number] MRN: [id number] ARRIVAL: 05/12/20 at [place]: [place]  Weakness   HISTORY OF PRESENT ILLNESS  05/12/20 2:49 AM John [name] is a 60 y.o. male who is currently undergoing treatment for lung cancer.  He is here with generalized weakness which began yesterday.  It has become so severe he is unable to stand up.  When he attempts to stand he feels lightheaded.  He has been diaphoretic but denies chest pain or palpitations.  On arrival he was noted to have a heart rate in the 190s.  EKG shows atrial fibrillation with rapid ventricular response.    Past Medical History:  Diagnosis Date  . Dyspnea   . Family history of adverse reaction to anesthesia    brother with seizures had episode under anesthesia.  Patient has seizures as well.  [name] GERD (gastroesophageal reflux disease)   . Rheumatoid aortitis   . Squamous cell lung cancer [facility])     Past Surgical History:  Procedure Laterality Date  . BRONCHIAL BRUSHINGS  03/27/2020   Procedure: BRONCHIAL BRUSHINGS;  Surgeon: [name], MD;  Location: [facility] ENDOSCOPY;  Service: Cardiopulmonary;;  . EYE SURGERY    . FINE NEEDLE ASPIRATION  03/27/2020   Procedure: FINE NEEDLE ASPIRATION;  Surgeon: [name], MD;  Location: [facility] ENDOSCOPY;  Service: Cardiopulmonary;;  . HIP SURGERY Left   . VIDEO BRONCHOSCOPY WITH ENDOBRONCHIAL ULTRASOUND N/A 03/27/2020   Procedure: VIDEO BRONCHOSCOPY WITH ENDOBRONCHIAL ULTRASOUND;  Surgeon: [name], MD;  Location: [facility];  Service: Cardiopulmonary;  Laterality: N/A;    History reviewed. No pertinent family history.  Social History   Tobacco Use  . Smoking status: Former Smoker    Packs/day: 1.00    Years: 30.00    Pack years: 30.00    Types: Cigarettes    Quit date: 2018    Years since quitting: 4.2  . Smokeless tobacco: Never Used  Vaping Use  . Vaping Use: Never used  Substance Use  Topics  . Alcohol use: Not Currently  . Drug use: Never    Prior to Admission medications   Medication Sig Start Date End Date Taking? Authorizing Provider  acetaminophen (TYLENOL) 500 MG tablet Take 500 mg by mouth every 6 (six) hours as needed for mild pain or moderate pain.   Yes [provider]  CARBOplatin 1000 MG/100ML SOLN Inject into the vein.   Yes [provider]  leflunomide (ARAVA) 20 MG tablet Take 20 mg by mouth at bedtime. 02/21/20  Yes [provider]  PACLitaxel 100 MG/16.67ML CONC Inject into the vein.   Yes [provider]  PARoxetine (PAXIL) 10 MG tablet Take 10 mg by mouth daily. 01/25/20  Yes [provider]  pegfilgrastim-bmez Tyson Dense) 6 MG/0.6ML injection Inject 0.6 mL into the skin 2 days after chemotherapy 05/05/20  Yes [name], [name], PA-C  PRILOSEC OTC 20 MG tablet Take 20 mg by mouth at bedtime. 11/13/19  Yes [provider]  prochlorperazine (COMPAZINE) 10 MG tablet Take 1 tablet (10 mg total) by mouth every 6 (six) hours as needed for nausea or vomiting. 04/28/20  Yes Curt Bears, MD  SIMPONI 50 MG/0.5ML SOAJ Inject 50 mg into the skin every 30 (thirty) days. 03/23/20  Yes [provider]  sodium chloride 0.9 % SOLN 50 mL with pembrolizumab 100 MG/4ML SOLN 2 mg/kg Inject 2 mg/kg into the vein.   Yes [provider]    Allergies Patient has no known allergies.   REVIEW OF SYSTEMS  Negative except as noted here or in the History of Present Illness.   PHYSICAL EXAMINATION  Initial Vital Signs Blood pressure (!) 136/107, pulse (!) 191, temperature 98.3 F (36.8 C), temperature source Oral, resp. rate (!) 24, height 5' 11.5" (1.816 m), weight 106.1 kg, SpO2 95 %.  Examination General: Well-developed, well-nourished male in no acute distress; appearance consistent with age of record HENT: normocephalic; atraumatic Eyes: pupils equal, round and reactive to light; extraocular  muscles intact; faint scleral icterus Neck: supple Heart: regular rate and rhythm; no murmurs, rubs or gallops Lungs: clear to auscultation bilaterally Abdomen: soft; nondistended; nontender; no masses or hepatosplenomegaly; bowel sounds present Extremities: No deformity; full range of motion; pulses normal Neurologic: Awake, alert and oriented; motor function intact in all extremities and symmetric; no facial droop Skin: Diaphoretic Psychiatric: Normal mood and affect   RESULTS  Summary of this visit's results, reviewed and interpreted by myself:   EKG Interpretation  Date/Time:  Monday May 12 2020 02:40:49 EDT Ventricular Rate:  197 PR Interval:    QRS Duration: 86 QT Interval:  255 QTC Calculation: 448 R Axis:   -71 Text Interpretation: Atrial fibrillation with rapid V-rate Multiple ventricular premature complexes Abnormal R-wave progression, early transition Inferior infarct, old No previous ECGs available Confirmed by Shanon Rosser [phone]) on 05/12/2020 2:49:51 AM      Laboratory Studies: Results for orders placed or performed during the hospital encounter of 05/12/20 (from the past 24 hour(s))  CBC with Differential     Status: Abnormal   Collection Time: 05/12/20  2:44 AM  Result Value Ref Range   WBC 0.3 (LL) 4.0 - 10.5 K/uL   RBC 4.11 (L) 4.22 - 5.81 MIL/uL   Hemoglobin 10.8 (L) 13.0 - 17.0 g/dL   HCT 32.8 (L) 39.0 - 52.0 %   MCV 79.8 (L) 80.0 - 100.0 fL   MCH 26.3 26.0 - 34.0 pg   MCHC 32.9 30.0 - 36.0 g/dL   RDW 14.3 11.5 - 15.5 %   Platelets 117 (L) 150 - 400 K/uL   nRBC 0.0 0.0 - 0.2 %   Neutrophils Relative % 0 %   Neutro Abs 0.0 (LL) 1.7 - 7.7 K/uL   Lymphocytes Relative 40 %   Lymphs Abs 0.1 (L) 0.7 - 4.0 K/uL   Monocytes Relative 38 %   Monocytes Absolute 0.1 0.1 - 1.0 K/uL   Eosinophils Relative 19 %   Eosinophils Absolute 0.1 0.0 - 0.5 K/uL   Basophils Relative 0 %   Basophils Absolute 0.0 0.0 - 0.1 K/uL   Immature Granulocytes 3 %   Abs  Immature Granulocytes 0.01 0.00 - 0.07 K/uL  Basic metabolic panel     Status: Abnormal   Collection Time: 05/12/20  2:44 AM  Result Value Ref Range   Sodium 126 (L) 135 - 145 mmol/L   Potassium 3.8 3.5 - 5.1 mmol/L   Chloride 94 (L) 98 - 111 mmol/L   CO2 21 (L) 22 - 32 mmol/L   Glucose, Bld 135 (H) 70 - 99 mg/dL   BUN 21 (H) 6 - 20 mg/dL   Creatinine, Ser 1.16 0.61 - 1.24 mg/dL   Calcium 8.2 (L) 8.9 - 10.3 mg/dL   GFR, Estimated >60 >60 mL/min   Anion gap 11 5 - 15  Troponin I (High Sensitivity)     Status: None   Collection Time: 05/12/20  2:44 AM  Result Value Ref Range   Troponin I (High Sensitivity) 7 <18 ng/L  D-dimer, quantitative     Status: Abnormal   Collection Time: 05/12/20  2:44 AM  Result Value Ref Range   D-Dimer, Quant 2.91 (H) 0.00 - 0.50 ug/mL-FEU  Hepatic function panel     Status: Abnormal   Collection Time: 05/12/20  2:54 AM  Result Value Ref Range   Total Protein 6.7 6.5 - 8.1 g/dL   Albumin 3.2 (L) 3.5 - 5.0 g/dL   AST 17 15 - 41 U/L   ALT 14 0 - 44 U/L   Alkaline Phosphatase 82 38 - 126 U/L   Total Bilirubin 2.8 (H) 0.3 - 1.2 mg/dL   Bilirubin, Direct 1.3 (H) 0.0 - 0.2 mg/dL   Indirect Bilirubin 1.5 (H) 0.3 - 0.9 mg/dL  Urinalysis, Routine w reflex microscopic Nasopharyngeal Swab     Status: None   Collection Time: 05/12/20  5:05 AM  Result Value Ref Range   Color, Urine YELLOW YELLOW   APPearance CLEAR CLEAR   Specific Gravity, Urine 1.023 1.005 - 1.030   pH 6.0 5.0 - 8.0   Glucose, UA NEGATIVE NEGATIVE mg/dL   Hgb urine dipstick NEGATIVE NEGATIVE   Bilirubin Urine NEGATIVE NEGATIVE   Ketones, ur NEGATIVE NEGATIVE mg/dL   Protein, ur NEGATIVE NEGATIVE mg/dL   Nitrite NEGATIVE NEGATIVE   Leukocytes,Ua NEGATIVE NEGATIVE   Imaging Studies: CT Angio Chest PE W and/or Wo Contrast  Result Date: 05/12/2020 CLINICAL DATA:  Lung cancer following initiation of chemotherapy 1 week prior., Elevated D-dimer, weakness. EXAM: CT ANGIOGRAPHY CHEST WITH  CONTRAST TECHNIQUE: Multidetector CT imaging of the chest was performed using the standard protocol during bolus administration of intravenous contrast. Multiplanar CT image reconstructions and MIPs were obtained to evaluate the vascular anatomy. CONTRAST:  176mL OMNIPAQUE IOHEXOL 350 MG/ML SOLN COMPARISON:  None. FINDINGS: Cardiovascular: There is adequate opacification of the pulmonary arterial tree. There is an infra luminal filling defect identified within the right lower lobe pulmonary artery which is eccentric and appears confluent with the adjacent pulmonary mass and likely reflects either direct invasion of the neoplasm into the right lower lobar pulmonary artery or mural irregularity secondary to neoplastic infiltration with secondary in situ thrombosis. No intraluminal filling defect, however, is identified to suggest pulmonary thromboembolism. The central pulmonary arteries are of normal caliber. No significant coronary artery calcification. Global cardiac size within normal limits. No pericardial effusion. Mild atherosclerotic calcification within the thoracic aorta. No aortic aneurysm. Mediastinum/Nodes: Thyroid unremarkable. As noted on prior PET CT examination of 04/29/2020, the mass is confluent with the right hilum. No additional pathologic thoracic adenopathy. The esophagus is unremarkable. Lungs/Pleura: Hypermetabolic cavitary mass is again identified conflict with right hila measuring roughly 4.5 x 7.1 cm at axial image # 59/6. Small satellite nodule again identified within the right upper lobe peripherally at axial image # 35/6 measuring 10 mm. Lungs are otherwise clear. No pneumothorax or pleural effusion. The mass obliterates the posterior segmental bronchus of the right upper lobe. Upper Abdomen: Limited images of the upper abdomen demonstrate the right adrenal metastasis partially, which measures at least 5.0 x 6.8 cm. Cholelithiasis again noted. Musculoskeletal: No lytic or blastic bone  lesions are identified. Review of the MIP images confirms the above findings. IMPRESSION: Intraluminal nonocclusive filling defect within the right lower lobe pulmonary artery in keeping with eccentric mural thrombus or tumor thrombus related to probable direct invasion of the vessel by the adjacent neoplastic mass. No CT evidence  of thromboembolic disease involving the pulmonary arterial tree. Stable cavitary mass within the right upper lobe obliterating the posterior segmental bronchus of the right upper lobe in keeping with the known primary neoplasm. This appears confluent with the right hilar nodal station. Small satellite nodule within the right upper lobe again noted. Right adrenal metastasis partially visualized. Cholelithiasis Aortic Atherosclerosis (ICD10-I70.0). Electronically Signed   By: Fidela Salisbury MD   On: 05/12/2020 04:46   DG Chest Portable 1 View  Result Date: 05/12/2020 CLINICAL DATA:  Weakness and tachycardia EXAM: PORTABLE CHEST 1 VIEW COMPARISON:  04/29/2020 FINDINGS: Cardiac shadows within normal limits. Lungs are well aerated bilaterally. Persistent central mass is noted along the minor fissure stable from the prior exam. Additional and nodule is noted in the right upper lobe stable from the prior CT examination. No other focal abnormality is seen. IMPRESSION: Stable right-sided lung masses. Electronically Signed   By: Inez Catalina M.D.   On: 05/12/2020 03:11    ED COURSE and MDM  Nursing notes, initial and subsequent vitals signs, including pulse oximetry, reviewed and interpreted by myself.  Vitals:   05/12/20 0351 05/12/20 0417 05/12/20 0430 05/12/20 0445  BP: (!) 91/51 113/69 111/63 100/68  Pulse: (!) 108 (!) 114 100 97  Resp: 15 20 16 18   Temp:      TempSrc:      SpO2: 98% 98% 97% 97%  Weight:      Height:       Medications  diltiazem (CARDIZEM) 1 mg/mL load via infusion 20 mg (20 mg Intravenous Bolus from Bag 05/12/20 0250)    And  diltiazem (CARDIZEM) 125 mg  in dextrose 5% 125 mL (1 mg/mL) infusion (12.5 mg/hr Intravenous Infusion Verify 05/12/20 0418)  heparin bolus via infusion 4,000 Units (has no administration in time range)  heparin ADULT infusion 100 units/mL (25000 units/219mL) (has no administration in time range)  calcium gluconate 1 g/ 50 mL sodium chloride IVPB (0 g Intravenous Stopped 05/12/20 0418)  0.9 %  sodium chloride infusion ( Intravenous New Bag/Given 05/12/20 0416)  iohexol (OMNIPAQUE) 350 MG/ML injection 100 mL (100 mLs Intravenous Contrast Given 05/12/20 0359)   3:30 AM Patient given Cardizem bolus and placed on Cardizem infusion for rate control.  His blood pressure dropped to the 02R systolic and he was started on 1 g of calcium gluconate IV.  3:44 AM Heart rate now 100-1 10 on Cardizem drip.  Patient feels much better.  D-dimer is significantly elevated, will obtain CT angio chest to evaluate for PE.  4:55 AM Pharmacy consulted for heparin.  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  4:27 AM Dr. Tonie Griffith to admit to hospitalist service.   CHA2DS2-VASc Score = 0  The patient's score is based upon: CHF History: No HTN History: No Diabetes History: No Stroke History: No Vascular Disease History: No Age Score: 0 Gender Score: 0 [id number]}  ASSESSMENT AND PLAN: Paroxysmal Atrial Fibrillation (ICD10:  I48.0) The patient's CHA2DS2-VASc score is 0, indicating a 0.2% annual risk of stroke.   PROCEDURES  Procedures CRITICAL CARE Performed by: Karen Chafe Vidalia Serpas Total critical care time: 30 minutes Critical care time was exclusive of separately billable procedures and treating other patients. Critical care was necessary to treat or prevent imminent or life-threatening deterioration. Critical care was time spent personally by me on the following activities: development of treatment plan with patient and/or surrogate as well as nursing, discussions with  consultants, evaluation  of patient's response to treatment, examination of patient, obtaining history from patient or surrogate, ordering and performing treatments and interventions, ordering and review of laboratory studies, ordering and review of radiographic studies, pulse oximetry and re-evaluation of patient's condition.   ED DIAGNOSES     ICD-10-CM   1. Atrial fibrillation with rapid ventricular response (HCC)  I48.91   2. Chemotherapy-induced neutropenia (HCC)  D70.1    T45.1X5A   3. Hyponatremia  E87.1   4. 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 Thrombus  I82.90        Joanann Mies, Jenny Reichmann, MD 05/12/20 [phone]

## 2020-05-12 NOTE — Progress Notes (Signed)
Pharmacy Antibiotic Note  John [name] is a 60 y.o. male admitted on 05/12/2020 with febrile neutropenia.  Pharmacy has been consulted for Zosyn dosing.  Plan: Zosyn 3.375gm IV q8h (4hr extended infusions) No dose adjustments needed, Pharmacy will sign off  Height: 5' 11.5" (181.6 cm) Weight: 106.1 kg (234 lb) IBW/kg (Calculated) : 76.45  Temp (24hrs), Avg:99.4 F (37.4 C), Min:98.3 F (36.8 C), Max:100.4 F (38 C)  Recent Labs  Lab 05/06/20 0930 05/12/20 0244  WBC 3.7* 0.3*  CREATININE  --  1.16    Estimated Creatinine Clearance: 86.7 mL/min (by C-G formula based on SCr of 1.16 mg/dL).    No Known Allergies  Antimicrobials this admission:  3/28 Zosyn >>  Microbiology results:  3/28 BCx: sent  Thank you for allowing pharmacy to be a part of this patient's care.  Peggyann Juba, PharmD, BCPS Pharmacy: [phone] 05/12/2020 9:13 AM

## 2020-05-12 NOTE — ED Notes (Signed)
Patient transported to CT 

## 2020-05-12 NOTE — Progress Notes (Signed)
LATE ENTRY   [phone] NSCLC - Procurement of Human Biospecimens for the Discovery and Validation of Biomarkers for the Prediction, Diagnosis, and Management of Disease  04/17/2020  Patient presented to clinic for lab appointment.  Research labs were collected by phlebotomist Carrington Clamp at [phone].    A $50 gift card was given to patient for his participation in the study.  He was thanked for his time and participation.  Clabe Seal Clinical Research Coordinator I  05/12/20  9:11 AM

## 2020-05-12 NOTE — Progress Notes (Signed)
ANTICOAGULATION CONSULT NOTE - Initial Consult  Pharmacy Consult for heparin Indication: new-onset atrial fibrillation and pulmonary arterial thrombus  No Known Allergies  Patient Measurements: Height: 5' 11.5" (181.6 cm) Weight: 106.1 kg (234 lb) IBW/kg (Calculated) : 76.45 Heparin Dosing Weight: 98.6kg  Vital Signs: Temp: 98.3 F (36.8 C) (03/28 0231) Temp Source: Oral (03/28 0231) BP: 100/68 (03/28 0445) Pulse Rate: 97 (03/28 0445)  Labs: Recent Labs    05/12/20 0244  HGB 10.8*  HCT 32.8*  PLT 117*  CREATININE 1.16  TROPONINIHS 7    Estimated Creatinine Clearance: 86.7 mL/min (by C-G formula based on SCr of 1.16 mg/dL).   Medical History: Past Medical History:  Diagnosis Date  . Dyspnea   . Family history of adverse reaction to anesthesia    brother with seizures had episode under anesthesia.  Patient has seizures as well.  [name] GERD (gastroesophageal reflux disease)   . Rheumatoid aortitis   . Squamous cell lung cancer [facility])       Assessment: 60 yo male presents with weakness, pt has lung cancer, started chemo ~  One week ago.  Pharmacy consulted to dose heparin drip for Afib and pulmonary arterial thrombus.  No prior AC noted  05/12/2020 Hgb 10.8 Plts 117 Baseline labs ordered stat  Goal of Therapy:  Heparin level 0.3-0.7 units/ml Monitor platelets by anticoagulation protocol:   Plan:  Bolus heparin 4000 units x 1 Start heparin drip at 1600 units/hr Heparin level in 6 hours Daily CBC   Dolly Rias RPh 05/12/2020, 5:04 AM

## 2020-05-12 NOTE — ED Triage Notes (Signed)
Pt came in with c/o weakness. Pt has lung cancer and started chemo approx one week ago. He states that he has felt bad since day 3 after his chemo. Pt states he can barely stand. Pt HR palpated and extremely irregular. ECG performed and HR 190s. Pt sweating profusely.

## 2020-05-12 NOTE — Progress Notes (Signed)
  Echocardiogram 2D Echocardiogram has been performed.  Matilde Bash 05/12/2020, 11:57 AM

## 2020-05-12 NOTE — Progress Notes (Signed)
Pharmacy: Eliquis --> LMWH for VTE prophylaxis  Patient's a 61 y.o M with lung cancer on chemotherapy (last treatment on 05/05/20) with Eliquis started on admission for afib. Chest CTA showed filling defects that could be mural thrombus related to cancer mass.  Dr. Pietro Cassis d/w heme/onc and plan is not to treat thrombus.  He also has a low CHADSVASC score and now with low plts. Plan is to not do long-term anticoag. and to start [place] for VTE prophylaxis.  Plan: - d/c Eliquis - Lovenox 40mg  SQ daily - Pharmacy will sign off. Re-consult [place] if need further assistance.  Dia Sitter, PharmD, BCPS 05/12/2020 11:06 AM

## 2020-05-12 NOTE — Progress Notes (Signed)
PROGRESS NOTE  John [name]  DOB: 1960/08/30  PCP: [name], MD EZM:[id number]  DOA: 05/12/2020  LOS: 0 days   Chief Complaint  Patient presents with  . Weakness   Brief narrative: John [name] is a 60 y.o. male with PMH significant for squamous cell carcinoma of lung, rheumatoid arthritis, GERD.  Patient presented to the ED early this morning with complaint of generalized weakness.  He was started on systemic chemotherapy last week.  Postchemotherapy, patient started having diarrhea, sore throat, no nausea or vomiting, generalized weakness which became so severe to a point at which he could not even stand up without getting lightheaded.  In the ED, he was tachycardic up to 190s, EKG showed A. fib with RVR.  Blood pressure mostly normal.  He was started on Cardizem drip Labs showed sodium 126, WBC low at 0.3, D-dimer elevated to 2.91. CT angio of chest was obtained.  In addition to the known right upper lobe mass, it showed an intraluminal nonocclusive filling defect within the right lower lobe pulmonary artery in keeping with eccentric mural thrombus or tumor thrombus related to probable direct invasion of the vessel by the adjacent neoplastic mass. No CT evidence of thromboembolic disease involving the pulmonary arterial tree. Patient was admitted to hospitalist service.  Subjective: Patient was seen and examined this morning.  Pleasant middle-aged Caucasian male.  Lying down in bed.  Not in distress.  Wants to eat but afraid that sore throat will be challenging. Chart reviewed. Tachycardia improving, blood pressure stable.  Breathing on room air. He had a fever this morning.  Assessment/Plan: Severe sepsis Febrile neutropenia -1 week post chemotherapy.  Presented with persistent diarrhea, low-grade fever at home, tachycardia, neutropenia, lactic acidosis -I would send blood cultures.  Obtain GI pathogen panel -start broad-spectrum antibiotics.  -Hemodynamically stable  at this time.  Start on maintenance IV fluid. Recent Labs  Lab 05/06/20 0930 05/12/20 0244 05/12/20 0944  WBC 3.7* 0.3*  --   LATICACIDVEN  --   --  2.2*   A. fib with RVR -Patient was started on Cardizem drip as well as Eliquis for anticoagulation. -CHADSVASC score is 0.  I would not commit him to long-term anticoagulation for A. fib which seems to be secondary to post chemotherapy alteration in hemodynamics  Right lower lobe pulmonary artery filling defect -CTA of chest showed intraluminal nonocclusive filling defect which could be secondary to direct invasion of the vessel by the absence of neoplastic process or an eccentric mural thrombus related to the mass.  There is no evidence of primary pulmonary thromboembolism.  I do not think patient qualifies for long-term anticoagulation with this.  Additionally, he is at risk of bleeding because of pancytopenia.  Platelet counts are already dropping.  I discussed CT findings with oncologist Dr. [name] this morning.  He did not recommend anticoagulation for this particular reason.  We will stop Eliquis.  Stage 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  -Patient was recently diagnosed in February 2022 with stage IV non-small cell lung cancer, squamous cell cancer with large right upper lobe mass, mediastinal lymphadenopathy and suspicious right adrenal gland mass.  He was seen by Dr. [name] at cancer center.  He underwent palliative radiotherapy, PET scan and a CT-guided core biopsy of the right adrenal gland.  Pancytopenia -Low WBC count, platelets trending down as well.  Watch out Recent Labs  Lab 05/06/20 0930 05/12/20 0244  WBC 3.7* 0.3*  NEUTROABS  --  0.0*  HGB 11.6* 10.8*  HCT 35.3* 32.8*  MCV 81.7 79.8*  PLT 266 117*   Odynophagia -Probably related to radiation.  No thrush on oral exam.   -Start on Magic mouthwash with lidocaine before each meal.  Rheumatoid arthritis -Continue leflunomide 20 mg daily at bedtime.  Last dose  of monthly Simponi was on 3/26.  Hypovolemic hyponatremia Recent Labs  Lab 05/12/20 0244  NA 126*   GERD -Continue Prilosec 20 mg at bedtime  Depression Continue Paxil 10 mg daily  Mobility: Encourage ambulation. Code Status:   Code Status: Full Code  Nutritional status: Body mass index is 32.18 kg/m.     Diet Order            Diet Heart Room service appropriate? Yes; Fluid consistency: Thin  Diet effective now                 DVT prophylaxis: Lovenox subcu   Antimicrobials:  Broad-spectrum antibiotics. Fluid: Normal saline at 100 mL/h Consultants: Oncology Family Communication:  Wife at bedside  Status is: Inpatient  Remains inpatient appropriate because: Septic postchemotherapy, requiring IV antibiotics and IV fluid  Dispo: The patient is from: Home              Anticipated d/c is to: Home              Patient currently is not medically stable to d/c.   Difficult to place patient No       Infusions:  . sodium chloride 100 mL/hr at 05/12/20 1040  . diltiazem (CARDIZEM) infusion 12.5 mg/hr (05/12/20 0418)  . piperacillin-tazobactam    . piperacillin-tazobactam (ZOSYN)  IV      Scheduled Meds: . magic mouthwash w/lidocaine  5 mL Oral TID AC & HS  . PARoxetine  10 mg Oral Daily    Antimicrobials: Anti-infectives (From admission, onward)   Start     Dose/Rate Route Frequency Ordered Stop   05/12/20 1800  piperacillin-tazobactam (ZOSYN) IVPB 3.375 g        3.375 g 12.5 mL/hr over 240 Minutes Intravenous Every 8 hours 05/12/20 0909     05/12/20 0945  piperacillin-tazobactam (ZOSYN) IVPB 3.375 g        3.375 g 100 mL/hr over 30 Minutes Intravenous  Once 05/12/20 0858        PRN meds:    Objective: Vitals:   05/12/20 0853 05/12/20 1043  BP: 111/69 100/78  Pulse: (!) 114 (!) 116  Resp: 18 18  Temp: (!) 100.4 F (38 C) 99.9 F (37.7 C)  SpO2: 93% 95%    Intake/Output Summary (Last 24 hours) at 05/12/2020 1049 Last data filed at  05/12/2020 0418 Gross per 24 hour  Intake 79.94 ml  Output -  Net 79.94 ml   Filed Weights   05/12/20 0231  Weight: 106.1 kg   Weight change:  Body mass index is 32.18 kg/m.   Physical Exam: General exam: Pleasant, middle-aged Caucasian male.  Not in physical distress Skin: No rashes, lesions or ulcers. HEENT: Atraumatic, normocephalic, no obvious bleeding.  No thrush on oral examination Lungs: Clear to auscultation bilaterally CVS: Rate controlled A. fib, no murmur GI/Abd soft, nontender, nondistended, bowel sound present CNS: Alert, awake, oriented x3 Psychiatry: Mood appropriate Extremities: No pedal edema, no calf tenderness  Data Review: I have personally reviewed the laboratory data and studies available.  Recent Labs  Lab 05/06/20 0930 05/12/20 0244  WBC 3.7* 0.3*  NEUTROABS  --  0.0*  HGB 11.6* 10.8*  HCT 35.3* 32.8*  MCV 81.7 79.8*  PLT 266 117*   Recent Labs  Lab 05/12/20 0244  NA 126*  K 3.8  CL 94*  CO2 21*  GLUCOSE 135*  BUN 21*  CREATININE 1.16  CALCIUM 8.2*    F/u labs ordered Unresulted Labs (From admission, onward)          Start     Ordered   05/13/20 4431  Basic metabolic panel  Tomorrow morning,   R        05/12/20 0916   05/13/20 0500  CBC  Tomorrow morning,   R        05/12/20 0916   05/12/20 5400  Basic metabolic panel  Once-Timed,   TIMED        05/12/20 0916   05/12/20 1047  Gastrointestinal Panel by PCR , Stool  (Gastrointestinal Panel by PCR, Stool                                                                                                                                                     **Does Not include CLOSTRIDIUM DIFFICILE testing. **If CDIFF testing is needed, place order from the "C Difficile Testing" order set.**)  Once,   R       Question:  Patient immune status  Answer:  Immunocompromised   05/12/20 1046   05/12/20 0917  HIV Antibody (routine testing w rflx)  (HIV Antibody (Routine testing w reflex) panel)   Once,   R        05/12/20 0916   05/12/20 0858  Culture, blood (x 2)  BLOOD CULTURE X 2,   STAT     Comments: INITIATE ANTIBIOTICS WITHIN 1 HOUR AFTER BLOOD CULTURES DRAWN. If unable to obtain blood cultures, call MD immediately regarding antibiotic instructions.    05/12/20 0858   05/12/20 0500  SARS CORONAVIRUS 2 (TAT 6-24 HRS) Nasopharyngeal Nasopharyngeal Swab  (Tier 3 - Symptomatic/asymptomatic with Precautions)  Once,   STAT       Question Answer Comment  Is this test for diagnosis or screening Screening   Symptomatic for COVID-19 as defined by CDC No   Hospitalized for COVID-19 No   Admitted to ICU for COVID-19 No   Previously tested for COVID-19 Unknown   Resident in a congregate (group) care setting No   Employed in healthcare setting No   Has patient completed COVID vaccination(s) (2 doses of Pfizer/Moderna 1 dose of [name]) Yes   Has patient completed COVID Booster / 3rd dose Yes      05/12/20 0459          Signed, Terrilee Croak, MD [facility] 05/12/2020

## 2020-05-12 NOTE — ED Notes (Signed)
Previous RN attempted to give report x 2 w no response. Called [facility], spoke w RN and said he would read pt SBAR shortly and pt will go to room soon

## 2020-05-13 ENCOUNTER — Inpatient Hospital Stay: Payer: 59 | Admitting: Physician Assistant

## 2020-05-13 ENCOUNTER — Inpatient Hospital Stay: Payer: 59

## 2020-05-13 ENCOUNTER — Encounter (HOSPITAL_COMMUNITY): Payer: Self-pay | Admitting: Family Medicine

## 2020-05-13 ENCOUNTER — Ambulatory Visit: Payer: 59

## 2020-05-13 DIAGNOSIS — A4151 Sepsis due to Escherichia coli [E. coli]: Secondary | ICD-10-CM | POA: Diagnosis not present

## 2020-05-13 DIAGNOSIS — R197 Diarrhea, unspecified: Secondary | ICD-10-CM | POA: Diagnosis not present

## 2020-05-13 DIAGNOSIS — R5081 Fever presenting with conditions classified elsewhere: Secondary | ICD-10-CM

## 2020-05-13 DIAGNOSIS — I951 Orthostatic hypotension: Secondary | ICD-10-CM

## 2020-05-13 DIAGNOSIS — D703 Neutropenia due to infection: Secondary | ICD-10-CM | POA: Diagnosis not present

## 2020-05-13 DIAGNOSIS — I4891 Unspecified atrial fibrillation: Secondary | ICD-10-CM

## 2020-05-13 DIAGNOSIS — I270 Primary pulmonary hypertension: Secondary | ICD-10-CM | POA: Diagnosis not present

## 2020-05-13 LAB — CBC
HCT: 27 % — ABNORMAL LOW (ref 39.0–52.0)
Hemoglobin: 9 g/dL — ABNORMAL LOW (ref 13.0–17.0)
MCH: 27.1 pg (ref 26.0–34.0)
MCHC: 33.3 g/dL (ref 30.0–36.0)
MCV: 81.3 fL (ref 80.0–100.0)
Platelets: 89 10*3/uL — ABNORMAL LOW (ref 150–400)
RBC: 3.32 MIL/uL — ABNORMAL LOW (ref 4.22–5.81)
RDW: 14.5 % (ref 11.5–15.5)
WBC: 0.5 10*3/uL — CL (ref 4.0–10.5)
nRBC: 0 % (ref 0.0–0.2)

## 2020-05-13 LAB — BLOOD CULTURE ID PANEL (REFLEXED) - BCID2

## 2020-05-13 LAB — DIFFERENTIAL

## 2020-05-13 LAB — BASIC METABOLIC PANEL
Anion gap: 12 (ref 5–15)
BUN: 14 mg/dL (ref 6–20)
CO2: 23 mmol/L (ref 22–32)
Calcium: 7.7 mg/dL — ABNORMAL LOW (ref 8.9–10.3)
Chloride: 97 mmol/L — ABNORMAL LOW (ref 98–111)
Creatinine, Ser: 1.05 mg/dL (ref 0.61–1.24)
GFR, Estimated: >60 mL/min (ref >60)
Glucose, Bld: 109 mg/dL — ABNORMAL HIGH (ref 70–99)
Potassium: 3.5 mmol/L (ref 3.5–5.1)
Sodium: 132 mmol/L — ABNORMAL LOW (ref 135–145)

## 2020-05-13 MED ORDER — SODIUM CHLORIDE 0.9 % IV SOLN
2.0000 g | INTRAVENOUS | Status: DC
  Administered 2020-05-13 – 2020-05-15 (×3): 2 g via INTRAVENOUS
  Filled 2020-05-13 (×2): qty 2
  Filled 2020-05-13: qty 20

## 2020-05-13 MED ORDER — POTASSIUM CHLORIDE CRYS ER 20 MEQ PO TBCR
40.0000 meq | EXTENDED_RELEASE_TABLET | Freq: Once | ORAL | Status: AC
  Administered 2020-05-13: 40 meq via ORAL
  Filled 2020-05-13: qty 2

## 2020-05-13 MED ORDER — ORAL CARE MOUTH RINSE
15.0000 mL | Freq: Two times a day (BID) | OROMUCOSAL | Status: DC
  Administered 2020-05-13 – 2020-05-20 (×13): 15 mL via OROMUCOSAL

## 2020-05-13 NOTE — Progress Notes (Signed)
Brief oncology note:  Chart has been reviewed.  He has persistent pancytopenia due to recent chemotherapy.  Total white blood cell count slightly improved today although hemoglobin and platelets are down.  He already received G-CSF anticipate that his white blood cell count will slowly recover.  Recommend PRBC transfusion for hemoglobin less than 8 and transfuse platelets for platelet count less than 20,000 or active bleeding.  CBC    Component Value Date/Time   WBC 0.5 (LL) 05/13/2020 0410   RBC 3.32 (L) 05/13/2020 0410   HGB 9.0 (L) 05/13/2020 0410   HGB 11.1 (L) 05/05/2020 0745   HCT 27.0 (L) 05/13/2020 0410   PLT 89 (L) 05/13/2020 0410   PLT 215 05/05/2020 0745   MCV 81.3 05/13/2020 0410   MCH 27.1 05/13/2020 0410   MCHC 33.3 05/13/2020 0410   RDW 14.5 05/13/2020 0410   LYMPHSABS 0.1 (L) 05/12/2020 0244   MONOABS 0.1 05/12/2020 0244   EOSABS 0.1 05/12/2020 0244   BASOSABS 0.0 05/12/2020 0244    I note that he had gram-negative rods in both the aerobic and anaerobic bottles and 1 set of blood cultures.  ID consult note has been reviewed and appreciate their assistance.  Cardiology has also been consulted to assist with management of A. fib with RVR and appreciate their assistance as well.  Mikey Bussing, DNP, AGPCNP-BC, AOCNP

## 2020-05-13 NOTE — Progress Notes (Signed)
Patient refused magic mouthwash w/lidocaine. Patient stated that he did not need the mouthwash that often. Nurse told patient that the mouthwash is to help with his sore throat so that way he would feel like eating.

## 2020-05-13 NOTE — Consult Note (Signed)
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  Immunosuppression by itself doesn't necessarily require longer duration of treatment as long as patient is source controlled and on appropriate abx with clinical improvement Therefore if repeat bcx is negative, will keep inline with current evidence of 7 day antibiotics for his ecoli bacteremia which is likely gut translocation in setting of chemotherapy/neutropenia  Of note, the team had stopped anticoagulation in this patient, as they do not think the chest cta represent PE  Diarrhea - developed since chemo, not frequent, but gi pcr being tested  Plan: 1. Continue ceftriaxone.  2. Repeat blood culture today -- if that remains negative, and patient continues to improve, plan 7 day antiobics from 3/29 3. Oral abx transition is appropriate once good PO absorption/intake and > 48 hours continued clinical improvement/stability  Principal Problem:   Atrial fibrillation with RVR (HCC) Active Problems:   Stage 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 (HCC)   Hyponatremia   Occlusion of right pulmonary artery (HCC)   Neutropenia (HCC)   Thrombus   Scheduled Meds: . enoxaparin (LOVENOX) injection  40 mg Subcutaneous QHS   . magic mouthwash w/lidocaine  5 mL Oral TID AC & HS  . mouth rinse  15 mL Mouth Rinse BID  . PARoxetine  10 mg Oral Daily   Continuous Infusions: . sodium chloride 100 mL/hr at 05/13/20 0038  . cefTRIAXone (ROCEPHIN)  IV 2 g (05/13/20 0850)  . diltiazem (CARDIZEM) infusion 12.5 mg/hr (05/13/20 0839)   PRN Meds:.acetaminophen, loperamide, ondansetron (ZOFRAN) IV  HPI: John [name] is a 60 y.o. male  nonsmall cell lung cancer on chemo and xrt, hx covid infection end of 2020, GERD, and rheumatoid arthritis, admitted 3/28 for generalized weakness of 2 days found to have sepsis/neutropenic fever with ecoli bacteremia  Chart reviewed  Patient reports 2 days sx onset with progression of severity. Associated sweat, and inability to function at home due to worsening generalized weakness  With his chemo reports diarrhea watery but no n/v/abd pain  No subjective f/c  Ed course: afib rvr 190s; fever 101s; hemodynamics stable Chest cta showed right pulm artery nonocclusive thrombus bcx obtained and ultimately showed ecoli per bcid; piptazo started after bcx He had resolved his fever on hD#1 with abx His hr is much improved with pharmacologic nodal blocking agent; cardiology following He is started on anticoagulation for PE  Other relevant hx: #RA on low dose 5 mg daily prednisone and leflunomide; [facility]ology #NSCLC  Sob 02/2019, then cough with occasional hemoptysis. Chest ct 1/25 7.2 cm mass right hilum and some cavitation with satellite nodules and occlusion RUL. Right adrenal mass 5.7x4.3 cm. Both areas are pet-avid Stage IIIb/IV (t4, n2, M0/M1 C) Right adrenal gland mass  bx'ed to be metastatic SCC Treatment: Beryle Flock, caboplatin/paclitaxel since 05/05/2020 along with planned xrt  No hardware/catheter  Review of Systems: ROS Other ros negative  Past Medical History:  Diagnosis Date  . Dyspnea   . Family history of adverse reaction to anesthesia     brother with seizures had episode under anesthesia.  Patient has seizures as well.  [name] GERD (gastroesophageal reflux disease)   . Rheumatoid aortitis   . Squamous cell lung cancer (HCC)     Social History   Tobacco Use  . Smoking status: Former Smoker    Packs/day: 1.00    Years: 30.00    Pack years: 30.00    Types: Cigarettes    Quit date: 2018    Years since quitting: 4.2  . Smokeless tobacco: Never Used  Vaping Use  . Vaping Use: Never used  Substance Use Topics  . Alcohol use: Not Currently  . Drug use: Never    Family History  Problem Relation Age of Onset  . Arrhythmia Mother        has PPM  . Heart disease Father        Died at 64, started in his 68s, heart attacks, had PPM and ICD   No Known Allergies  OBJECTIVE: Blood pressure (!) 92/49, pulse (!) 135, temperature 98.3 F (36.8 C), temperature source Oral, resp. rate 16, height 5' 11.5" (1.816 m), weight 106.1 kg, SpO2 96 %.  Physical Exam No distress, conversant, wife at bedside Heent: normocephalic; per; conj clear; eomi Neck supple cv irr; no mrg Lungs clear abd s/nt Ext no edema Skin no rash Neuro nonfocal; generalized weakness strength 4-5/5 Psych alert/oriented msk no peripheral joint/back tenderness  Lab Results Lab Results  Component Value Date   WBC 0.5 (LL) 05/13/2020   HGB 9.0 (L) 05/13/2020   HCT 27.0 (L) 05/13/2020   MCV 81.3 05/13/2020   PLT 89 (L) 05/13/2020    Lab Results  Component Value Date   CREATININE 1.05 05/13/2020   BUN 14 05/13/2020   NA 132 (L) 05/13/2020   K 3.5 05/13/2020   CL 97 (L) 05/13/2020   CO2 23 05/13/2020    Lab Results  Component Value Date   ALT 14 05/12/2020   AST 17 05/12/2020   ALKPHOS 82 05/12/2020   BILITOT 2.8 (H) 05/12/2020     Microbiology: Recent Results (from the past 240 hour(s))  SARS CORONAVIRUS 2 (TAT 6-24 HRS) Nasopharyngeal Nasopharyngeal Swab     Status: None   Collection Time: 05/12/20  5:09 AM   Specimen: Nasopharyngeal  Swab  Result Value Ref Range Status   SARS Coronavirus 2 NEGATIVE NEGATIVE Final    Comment: (NOTE) SARS-CoV-2 target nucleic acids are NOT DETECTED.  The SARS-CoV-2 RNA is generally detectable in upper and lower respiratory specimens during the acute phase of infection. Negative results do not preclude SARS-CoV-2 infection, do not rule out co-infections with other pathogens, and should not be used as the sole basis for treatment or other patient management decisions. Negative results must be combined with clinical observations, patient history, and epidemiological information. The expected result is Negative.  Fact Sheet for Patients: SugarRoll.be  Fact Sheet for Healthcare Providers: https://www.woods-mathews.com/  This test is not yet approved or cleared by the [place] FDA and  has been authorized for detection and/or diagnosis of SARS-CoV-2 by FDA under an Emergency Use Authorization (EUA). This EUA will remain  in effect (meaning this test can be used) for the duration of the COVID-19  declaration under Se ction 564(b)(1) of the Act, 21 U.S.C. section 360bbb-3(b)(1), unless the authorization is terminated or revoked sooner.  Performed at [facility], [place] [address] [place], [place] [address]   Culture, blood (x 2)     Status: None (Preliminary result)   Collection Time: 05/12/20  9:43 AM   Specimen: BLOOD RIGHT HAND  Result Value Ref Range Status   Specimen Description   Final    BLOOD RIGHT HAND Performed at [facility] [address], [place], [place] [address]    Special Requests   Final    BOTTLES DRAWN AEROBIC ONLY Blood Culture adequate volume Performed at [facility] [address], [place], [place] [address]    Culture   Final    NO GROWTH < 24 HOURS Performed at [facility] [address] [place], [place] [address]    Report Status PENDING  Incomplete   Culture, blood (x 2)     Status: None (Preliminary result)   Collection Time: 05/12/20  9:43 AM   Specimen: Right Antecubital; Blood  Result Value Ref Range Status   Specimen Description   Final    RIGHT ANTECUBITAL Performed at [facility] [address] [place], [place] [address]    Special Requests   Final    BOTTLES DRAWN AEROBIC AND ANAEROBIC Blood Culture adequate volume Performed at [facility] [name] [place], [place] [address]    Culture  Setup Time   Final    IN BOTH AEROBIC AND ANAEROBIC BOTTLES GRAM NEGATIVE RODS Organism ID to follow CRITICAL RESULT CALLED TO, READ BACK BY AND VERIFIED WITH: L POINDEXTER PHARMD 05/13/20 0405 JDW    Culture   Final    NO GROWTH < 24 HOURS Performed at [facility], [place] [address], [place], [place] [address]    Report Status PENDING  Incomplete  Blood Culture ID Panel (Reflexed)     Status: Abnormal   Collection Time: 05/12/20  9:43 AM  Result Value Ref Range Status   Enterococcus faecalis NOT DETECTED NOT DETECTED Final   Enterococcus Faecium NOT DETECTED NOT DETECTED Final   Listeria monocytogenes NOT DETECTED NOT DETECTED Final   Staphylococcus species NOT DETECTED NOT DETECTED Final   Staphylococcus aureus (BCID) NOT DETECTED NOT DETECTED Final   Staphylococcus epidermidis NOT DETECTED NOT DETECTED Final   Staphylococcus lugdunensis NOT DETECTED NOT DETECTED Final   Streptococcus species NOT DETECTED NOT DETECTED Final   Streptococcus agalactiae NOT DETECTED NOT DETECTED Final   Streptococcus pneumoniae NOT DETECTED NOT DETECTED Final   Streptococcus pyogenes NOT DETECTED NOT DETECTED Final   A.calcoaceticus-baumannii NOT DETECTED NOT DETECTED Final   Bacteroides fragilis NOT DETECTED NOT DETECTED Final   Enterobacterales DETECTED (A) NOT DETECTED Final    Comment: Enterobacterales represent a large order of gram negative bacteria, not a single organism. CRITICAL RESULT  CALLED TO, READ BACK BY AND VERIFIED WITH: L POINDEXTER PHAMRD 05/13/20 0405 JDW    Enterobacter cloacae complex NOT DETECTED NOT DETECTED Final   Escherichia coli DETECTED (A) NOT DETECTED Final    Comment: CRITICAL RESULT CALLED TO, READ BACK BY AND VERIFIED WITH: L POINDEXTER PHARMD 05/13/20 0405 JDW    Klebsiella aerogenes NOT DETECTED NOT DETECTED Final   Klebsiella oxytoca NOT DETECTED NOT DETECTED Final   Klebsiella pneumoniae NOT DETECTED NOT DETECTED Final   Proteus species NOT DETECTED NOT DETECTED Final   Salmonella species NOT DETECTED  NOT DETECTED Final   Serratia marcescens NOT DETECTED NOT DETECTED Final   Haemophilus influenzae NOT DETECTED NOT DETECTED Final   Neisseria meningitidis NOT DETECTED NOT DETECTED Final   Pseudomonas aeruginosa NOT DETECTED NOT DETECTED Final   Stenotrophomonas maltophilia NOT DETECTED NOT DETECTED Final   Candida albicans NOT DETECTED NOT DETECTED Final   Candida auris NOT DETECTED NOT DETECTED Final   Candida glabrata NOT DETECTED NOT DETECTED Final   Candida krusei NOT DETECTED NOT DETECTED Final   Candida parapsilosis NOT DETECTED NOT DETECTED Final   Candida tropicalis NOT DETECTED NOT DETECTED Final   Cryptococcus neoformans/gattii NOT DETECTED NOT DETECTED Final   CTX-M ESBL NOT DETECTED NOT DETECTED Final   Carbapenem resistance IMP NOT DETECTED NOT DETECTED Final   Carbapenem resistance KPC NOT DETECTED NOT DETECTED Final   Carbapenem resistance NDM NOT DETECTED NOT DETECTED Final   Carbapenem resist OXA 48 LIKE NOT DETECTED NOT DETECTED Final   Carbapenem resistance VIM NOT DETECTED NOT DETECTED Final    Comment: Performed at [facility], [address], [place], [place] [address]     Serology:   Imaging: If present, new imagings (plain films, ct scans, and mri) have been personally visualized and interpreted; radiology reports have been reviewed. Decision making incorporated into the Impression /  Recommendations.  3/28 chest cta Intraluminal nonocclusive filling defect within the right lower lobe pulmonary artery in keeping with eccentric mural thrombus or tumor thrombus related to probable direct invasion of the vessel by the adjacent neoplastic mass. No CT evidence of thromboembolic disease involving the pulmonary arterial tree.  Stable cavitary mass within the right upper lobe obliterating the posterior segmental bronchus of the right upper lobe in keeping with the known primary neoplasm. This appears confluent with the right hilar nodal station. Small satellite nodule within the right upper lobe again noted.  Right adrenal metastasis partially visualized.  Cholelithiasis   3/15 pet 1. Right parahilar mass lesion is markedly hypermetabolic consistent with primary bronchogenic neoplasm. The hypermetabolic soft tissue from this lesion extends into the upper right hilum. No evidence for hypermetabolic metastases in the neck, mediastinum or left hilum. 2. Numerous tiny satellite nodules in the right lung below threshold for resolution on PET imaging. Dominant additional right lung nodule measuring 11 mm in the right upper lobe shows only low level FDG accumulation. 3. Interval progression of right adrenal mass, now measuring 6.5 cm and demonstrating marked hypermetabolism, highly suspicious for metastatic disease. 4. No other findings to suggest hypermetabolic metastases in the abdomen or pelvis. 5. Cholelithiasis. 6.  Emphysema  Jabier Mutton, [facility] for Infectious [place] [phone] pager    05/13/2020, 1:41 PM

## 2020-05-13 NOTE — Consult Note (Signed)
Cardiology Consultation:   Patient ID: John [name] MRN: [id number]; DOB: 1960/09/20  Admit date: 05/12/2020 Date of Consult: 05/13/2020  PCP:  [name], MD   [facility]  Cardiologist:  Fransico Him, MD New Advanced Practice Provider:  No care team member to display Electrophysiologist:  None    Patient Profile:   John [name] is a 60 y.o. 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  History of Present Illness:   John [name] was admitted 03/28 with weakness, orthostatic dizziness, diaphoresis. Hx diarrhea from chemo, but no N&V. CT w/ non-occlusive thrombus R pulm artery,   Na+ 126, WBCs 0.3 on admit. Na+ 130, WBCs 0.5 today.  Pt has no palpitations, no idea how long he has been in fib.   He has had 1 chemo treatment, his HR was 115 when he went in for that. No ECG done. He was not dizzy or weak at that time.  3 days after chemo, he began getting the weakness and orthostatic sx. He would get diaphoretic whenever he got up and tried to walk. Had to sit down to keep from falling.   Was getting diarrhea from the chemo, multiple times a day. That has improved.   Had some L chest pressure when HR very high, that has resolved.   Has multiple areas of MS pain 2nd RA.   Past Medical History:  Diagnosis Date  . Dyspnea   . Family history of adverse reaction to anesthesia    brother with seizures had episode under anesthesia.  Patient has seizures as well.  [name] GERD (gastroesophageal reflux disease)   . Rheumatoid aortitis   . Squamous cell lung cancer [facility])     Past Surgical History:  Procedure Laterality Date  . BRONCHIAL BRUSHINGS  03/27/2020   Procedure: BRONCHIAL BRUSHINGS;  Surgeon: [name], MD;  Location: [facility] ENDOSCOPY;  Service: Cardiopulmonary;;  . CATARACT EXTRACTION  2016   at [facility]  . FINE  NEEDLE ASPIRATION  03/27/2020   Procedure: FINE NEEDLE ASPIRATION;  Surgeon: [name], MD;  Location: [facility] ENDOSCOPY;  Service: Cardiopulmonary;;  . HIP SURGERY Left   . VIDEO BRONCHOSCOPY WITH ENDOBRONCHIAL ULTRASOUND N/A 03/27/2020   Procedure: VIDEO BRONCHOSCOPY WITH ENDOBRONCHIAL ULTRASOUND;  Surgeon: [name], MD;  Location: [facility];  Service: Cardiopulmonary;  Laterality: N/A;     Home Medications:  Prior to Admission medications   Medication Sig Start Date End Date Taking? Authorizing Provider  acetaminophen (TYLENOL) 500 MG tablet Take 500 mg by mouth every 6 (six) hours as needed for mild pain or moderate pain.   Yes [provider]  CARBOplatin 1000 MG/100ML SOLN Inject into the vein.   Yes [provider]  leflunomide (ARAVA) 20 MG tablet Take 20 mg by mouth at bedtime. 02/21/20  Yes [provider]  PACLitaxel 100 MG/16.67ML CONC Inject into the vein.   Yes [provider]  PARoxetine (PAXIL) 10 MG tablet Take 10 mg by mouth daily. 01/25/20  Yes [provider]  pegfilgrastim-bmez Tyson Dense) 6 MG/0.6ML injection Inject 0.6 mL into the skin 2 days after chemotherapy 05/05/20  Yes [name], [name], PA-C  PRILOSEC OTC 20 MG tablet Take 20 mg by mouth at bedtime. 11/13/19  Yes [provider]  prochlorperazine (COMPAZINE) 10 MG tablet Take 1 tablet (10 mg  total) by mouth every 6 (six) hours as needed for nausea or vomiting. 04/28/20  Yes Curt Bears, MD  SIMPONI 50 MG/0.5ML SOAJ Inject 50 mg into the skin every 30 (thirty) days. 03/23/20  Yes [provider]  sodium chloride 0.9 % SOLN 50 mL with pembrolizumab 100 MG/4ML SOLN 2 mg/kg Inject 2 mg/kg into the vein.   Yes [provider]    Inpatient Medications: Scheduled Meds: . enoxaparin (LOVENOX) injection  40 mg Subcutaneous QHS  . magic mouthwash w/lidocaine  5 mL Oral TID AC & HS  . mouth rinse  15 mL Mouth Rinse BID  . PARoxetine   10 mg Oral Daily   Continuous Infusions: . sodium chloride 100 mL/hr at 05/13/20 0038  . cefTRIAXone (ROCEPHIN)  IV 2 g (05/13/20 0850)  . diltiazem (CARDIZEM) infusion 12.5 mg/hr (05/13/20 0839)   PRN Meds: acetaminophen, loperamide, ondansetron (ZOFRAN) IV  Allergies:   No Known Allergies  Social History:   Social History   Socioeconomic History  . Marital status: Married    Spouse name: Not on file  . Number of children: Not on file  . Years of education: Not on file  . Highest education level: Not on file  Occupational History  . Not on file  Tobacco Use  . Smoking status: Former Smoker    Packs/day: 1.00    Years: 30.00    Pack years: 30.00    Types: Cigarettes    Quit date: 2018    Years since quitting: 4.2  . Smokeless tobacco: Never Used  Vaping Use  . Vaping Use: Never used  Substance and Sexual Activity  . Alcohol use: Not Currently  . Drug use: Never  . Sexual activity: Not on file  Other Topics Concern  . Not on file  Social History Narrative  . Not on file   Social Determinants of Health   Financial Resource Strain: Not on file  Food Insecurity: Not on file  Transportation Needs: Not on file  Physical Activity: Not on file  Stress: Not on file  Social Connections: Not on file  Intimate Partner Violence: Not on file    Family History:   Family Status  Relation Name Status  . Mother  Alive  . Father  Deceased    Family History  Problem Relation Age of Onset  . Arrhythmia Mother        has PPM  . Heart disease Father        Died at 76, started in his 67s, heart attacks, had PPM and ICD     ROS:  Please see the history of present illness.  All other ROS reviewed and negative.     Physical Exam/Data:   Vitals:   05/13/20 0206 05/13/20 0413 05/13/20 0923 05/13/20 1020  BP: 102/77 122/65 (!) 92/49   Pulse: 93 94 94 (!) 135  Resp: 18 18 16    Temp: 99.1 F (37.3 C) 98.8 F (37.1 C) 98.3 F (36.8 C)   TempSrc: Oral Oral Oral    SpO2: 93% 98% 96%   Weight:      Height:       Orthostatic VS for the past 24 hrs:  BP- Lying BP- Sitting BP- Standing at 0 minutes  05/13/20 1020 104/66 91/60 (!) 79/58    Intake/Output Summary (Last 24 hours) at 05/13/2020 1129 Last data filed at 05/13/2020 9242 Gross per 24 hour  Intake 2007.19 ml  Output 0 ml  Net 2007.19 ml   Last  3 Weights 05/12/2020 05/06/2020 04/28/2020  Weight (lbs) 234 lb 234 lb 11.2 oz 234 lb 11.2 oz  Weight (kg) 106.142 kg 106.459 kg 106.459 kg     Body mass index is 32.18 kg/m.  General:  Well nourished, well developed, in no acute distress at rest HEENT: normal Lymph: no adenopathy Neck: no JVD Endocrine:  No thryomegaly Vascular: No carotid bruits; 4/4 extremity pulses 2+ bilaterally  Cardiac:  normal S1, S2; Irreg R&R; no murmur Lungs:  clear to auscultation bilaterally, no wheezing, rhonchi or rales  Abd: soft, nontender, no hepatomegaly  Ext: no edema Musculoskeletal:  No deformities, BUE and BLE strength weak but equal Skin: warm and dry  Neuro:  CNs 2-12 intact, no focal abnormalities noted Psych:  Normal affect   EKG:  The EKG was personally reviewed and demonstrates:  Atrial fib, RVR, HR 197 Telemetry:  Telemetry was personally reviewed and demonstrates:  Atrial fib, occ PVCs, HR slowly improving overnight.   Relevant CV Studies:  ECHO:  03/28  1. Left ventricular ejection fraction, by estimation, is 60 to 65%. The  left ventricle has normal function. The left ventricle has no regional  wall motion abnormalities. Left ventricular diastolic parameters are  indeterminate.  2. Right ventricular systolic function is normal. The right ventricular  size is normal. Tricuspid regurgitation signal is inadequate for assessing  PA pressure.  3. The mitral valve is normal in structure. Trivial mitral valve  regurgitation. No evidence of mitral stenosis.  4. The aortic valve is tricuspid. Aortic valve regurgitation is not  visualized. No  aortic stenosis is present.  5. The inferior vena cava is normal in size with greater than 50%  respiratory variability, suggesting right atrial pressure of 3 mmHg.  6. The patient is in atrial fibrillation.  Laboratory Data:  High Sensitivity Troponin:   Recent Labs  Lab 05/12/20 0244 05/12/20 0444 05/12/20 1056 05/12/20 1247  TROPONINIHS 7 7 8 7      Chemistry Recent Labs  Lab 05/12/20 0244 05/12/20 1247 05/13/20 0410  NA 126* 130* 132*  K 3.8 3.6 3.5  CL 94* 96* 97*  CO2 21* 25 23  GLUCOSE 135* 133* 109*  BUN 21* 17 14  CREATININE 1.16 0.92 1.05  CALCIUM 8.2* 7.4* 7.7*  GFRNONAA >60 >60 >60  ANIONGAP 11 9 12     Recent Labs  Lab 05/12/20 0254  PROT 6.7  ALBUMIN 3.2*  AST 17  ALT 14  ALKPHOS 82  BILITOT 2.8*   Hematology Recent Labs  Lab 05/12/20 0244 05/13/20 0410  WBC 0.3* 0.5*  RBC 4.11* 3.32*  HGB 10.8* 9.0*  HCT 32.8* 27.0*  MCV 79.8* 81.3  MCH 26.3 27.1  MCHC 32.9 33.3  RDW 14.3 14.5  PLT 117* 89*   BNPNo results for input(s): BNP, PROBNP in the last 168 hours.  DDimer  Recent Labs  Lab 05/12/20 0244  DDIMER 2.91*   Lab Results  Component Value Date   TSH 2.141 05/05/2020   No results found for: HGBA1C No results found for: CHOL, HDL, LDLCALC, LDLDIRECT, TRIG, CHOLHDL   Radiology/Studies:  CT Angio Chest PE W and/or Wo Contrast  Result Date: 05/12/2020 CLINICAL DATA:  Lung cancer following initiation of chemotherapy 1 week prior., Elevated D-dimer, weakness. EXAM: CT ANGIOGRAPHY CHEST WITH CONTRAST TECHNIQUE: Multidetector CT imaging of the chest was performed using the standard protocol during bolus administration of intravenous contrast. Multiplanar CT image reconstructions and MIPs were obtained to evaluate the vascular anatomy. CONTRAST:  128mL OMNIPAQUE  IOHEXOL 350 MG/ML SOLN COMPARISON:  None. FINDINGS: Cardiovascular: There is adequate opacification of the pulmonary arterial tree. There is an infra luminal filling defect  identified within the right lower lobe pulmonary artery which is eccentric and appears confluent with the adjacent pulmonary mass and likely reflects either direct invasion of the neoplasm into the right lower lobar pulmonary artery or mural irregularity secondary to neoplastic infiltration with secondary in situ thrombosis. No intraluminal filling defect, however, is identified to suggest pulmonary thromboembolism. The central pulmonary arteries are of normal caliber. No significant coronary artery calcification. Global cardiac size within normal limits. No pericardial effusion. Mild atherosclerotic calcification within the thoracic aorta. No aortic aneurysm. Mediastinum/Nodes: Thyroid unremarkable. As noted on prior PET CT examination of 04/29/2020, the mass is confluent with the right hilum. No additional pathologic thoracic adenopathy. The esophagus is unremarkable. Lungs/Pleura: Hypermetabolic cavitary mass is again identified conflict with right hila measuring roughly 4.5 x 7.1 cm at axial image # 59/6. Small satellite nodule again identified within the right upper lobe peripherally at axial image # 35/6 measuring 10 mm. Lungs are otherwise clear. No pneumothorax or pleural effusion. The mass obliterates the posterior segmental bronchus of the right upper lobe. Upper Abdomen: Limited images of the upper abdomen demonstrate the right adrenal metastasis partially, which measures at least 5.0 x 6.8 cm. Cholelithiasis again noted. Musculoskeletal: No lytic or blastic bone lesions are identified. Review of the MIP images confirms the above findings. IMPRESSION: Intraluminal nonocclusive filling defect within the right lower lobe pulmonary artery in keeping with eccentric mural thrombus or tumor thrombus related to probable direct invasion of the vessel by the adjacent neoplastic mass. No CT evidence of thromboembolic disease involving the pulmonary arterial tree. Stable cavitary mass within the right upper lobe  obliterating the posterior segmental bronchus of the right upper lobe in keeping with the known primary neoplasm. This appears confluent with the right hilar nodal station. Small satellite nodule within the right upper lobe again noted. Right adrenal metastasis partially visualized. Cholelithiasis Aortic Atherosclerosis (ICD10-I70.0). Electronically Signed   By: Fidela Salisbury MD   On: 05/12/2020 04:46   DG Chest Portable 1 View  Result Date: 05/12/2020 CLINICAL DATA:  Weakness and tachycardia EXAM: PORTABLE CHEST 1 VIEW COMPARISON:  04/29/2020 FINDINGS: Cardiac shadows within normal limits. Lungs are well aerated bilaterally. Persistent central mass is noted along the minor fissure stable from the prior exam. Additional and nodule is noted in the right upper lobe stable from the prior CT examination. No other focal abnormality is seen. IMPRESSION: Stable right-sided lung masses. Electronically Signed   By: Inez Catalina M.D.   On: 05/12/2020 03:11   ECHOCARDIOGRAM COMPLETE  Result Date: 05/12/2020    ECHOCARDIOGRAM REPORT   Patient Name:   [facility] Date of Exam: 05/12/2020 Medical Rec #:  [id number]     Height:       71.5 in Accession #:    [id number]    Weight:       234.0 lb Date of Birth:  [birth year]     BSA:          2.266 m Patient Age:    32 years      BP:           100/78 mmHg Patient Gender: M             HR:           116 bpm. Exam Location:  Inpatient Procedure: 2D Echo, Cardiac Doppler and Color  Doppler Indications:    Atrial fibrillation  History:        Patient has no prior history of Echocardiogram examinations.                 Arrythmias:Atrial Fibrillation; Signs/Symptoms:Lung cancer,                 chemo, radiation therapy.  Sonographer:    Dustin Flock Referring Phys: [id number] [place] Comments: Image acquisition challenging due to respiratory motion. IMPRESSIONS  1. Left ventricular ejection fraction, by estimation, is 60 to 65%. The left ventricle has normal  function. The left ventricle has no regional wall motion abnormalities. Left ventricular diastolic parameters are indeterminate.  2. Right ventricular systolic function is normal. The right ventricular size is normal. Tricuspid regurgitation signal is inadequate for assessing PA pressure.  3. The mitral valve is normal in structure. Trivial mitral valve regurgitation. No evidence of mitral stenosis.  4. The aortic valve is tricuspid. Aortic valve regurgitation is not visualized. No aortic stenosis is present.  5. The inferior vena cava is normal in size with greater than 50% respiratory variability, suggesting right atrial pressure of 3 mmHg.  6. The patient is in atrial fibrillation. FINDINGS  Left Ventricle: Left ventricular ejection fraction, by estimation, is 60 to 65%. The left ventricle has normal function. The left ventricle has no regional wall motion abnormalities. The left ventricular internal cavity size was normal in size. There is  no left ventricular hypertrophy. Left ventricular diastolic parameters are indeterminate. Right Ventricle: The right ventricular size is normal. No increase in right ventricular wall thickness. Right ventricular systolic function is normal. Tricuspid regurgitation signal is inadequate for assessing PA pressure. Left Atrium: Left atrial size was normal in size. Right Atrium: Right atrial size was normal in size. Pericardium: There is no evidence of pericardial effusion. Mitral Valve: The mitral valve is normal in structure. Trivial mitral valve regurgitation. No evidence of mitral valve stenosis. Tricuspid Valve: The tricuspid valve is normal in structure. Tricuspid valve regurgitation is not demonstrated. Aortic Valve: The aortic valve is tricuspid. Aortic valve regurgitation is not visualized. No aortic stenosis is present. Pulmonic Valve: The pulmonic valve was normal in structure. Pulmonic valve regurgitation is not visualized. Aorta: The aortic root is normal in size and  structure. Venous: The inferior vena cava is normal in size with greater than 50% respiratory variability, suggesting right atrial pressure of 3 mmHg. IAS/Shunts: No atrial level shunt detected by color flow Doppler.  LEFT VENTRICLE PLAX 2D LVIDd:         5.30 cm  Diastology LVIDs:         3.90 cm  LV e' medial:    6.74 cm/s LV PW:         1.10 cm  LV E/e' medial:  11.4 LV IVS:        0.90 cm  LV e' lateral:   6.85 cm/s LVOT diam:     2.20 cm  LV E/e' lateral: 11.2 LV SV:         73 LV SV Index:   32 LVOT Area:     3.80 cm  RIGHT VENTRICLE RV Basal diam:  2.40 cm RV S prime:     9.68 cm/s TAPSE (M-mode): 2.4 cm LEFT ATRIUM             Index       RIGHT ATRIUM           Index LA diam:  3.40 cm 1.50 cm/m  RA Area:     14.40 cm LA Vol (A2C):   48.1 ml 21.23 ml/m RA Volume:   39.50 ml  17.43 ml/m LA Vol (A4C):   39.6 ml 17.48 ml/m LA Biplane Vol: 45.1 ml 19.90 ml/m  AORTIC VALVE LVOT Vmax:   111.00 cm/s LVOT Vmean:  67.600 cm/s LVOT VTI:    0.191 m  AORTA Ao Root diam: 3.50 cm MITRAL VALVE MV Area (PHT): 5.54 cm    SHUNTS MV Decel Time: 137 msec    Systemic VTI:  0.19 m MV E velocity: 76.70 cm/s  Systemic Diam: 2.20 cm MV A velocity: 65.10 cm/s MV E/A ratio:  1.18 Loralie Champagne MD Electronically signed by Loralie Champagne MD Signature Date/Time: 05/12/2020/3:33:39 PM    Final      Assessment and Plan:   1. Atrial fib, RVR - HR 190s on admit, improved w/ IVF and IV Cardizem - since BP is borderline, continue Cardizem IV for now - MD advise on dig - still w/ orthostatic dizziness,  Orthostatic VS + on admit - continue IVF, may help w/ HR -  Consider TEE/DCCV, but would have to commit to anticoag, at least short-term - w/ ?thrombus in pulm artery, will need anticoag anyway  2. Squamous cell lung CA - started chemo, sig side effects - per Dr Mohammed/IM    Risk Assessment/Risk Scores:   CHA2DS2-VASc Score = 0  This indicates a 0.2% annual risk of stroke. The patient's score is based  upon: CHF History: No HTN History: No Diabetes History: No Stroke History: No Vascular Disease History: No Age Score: 0 Gender Score: 0          For questions or updates, please contact [place] Please consult [email or web address] for contact info under    Signed, Rosaria Ferries, PA-C  05/13/2020 11:29 AM

## 2020-05-13 NOTE — Progress Notes (Signed)
Nurse assessed patient's skin and noticed a rash/red spots on patient's legs and thighs bilaterally. Patient stated, "it had been there for a few days", and "it does not itch". Patient does not have a rash/red spots anywhere else on patient's body. Will continue to monitor and pass along to day shift.

## 2020-05-13 NOTE — Progress Notes (Addendum)
PHARMACY - PHYSICIAN COMMUNICATION CRITICAL VALUE ALERT - BLOOD CULTURE IDENTIFICATION (BCID)  John [name] is an 60 y.o. male who presented to [facility] on 05/12/2020 with a chief complaint of generalized weakness.  Assessment:  2 out of 3 bottles (1 complete set) = + E.Coli with no resistances   Name of physician (or Provider) Contacted: Dr Tonie Griffith  Current antibiotics: Zosyn 3.375gm IV q8h (extended interval)  Changes to prescribed antibiotics recommended:  De-escalate antibiotics to Ceftriaxone 2gm IV q24h Recommendations accepted by provider  Results for orders placed or performed during the hospital encounter of 05/12/20  Blood Culture ID Panel (Reflexed) (Collected: 05/12/2020  9:43 AM)  Result Value Ref Range   Enterococcus faecalis NOT DETECTED NOT DETECTED   Enterococcus Faecium NOT DETECTED NOT DETECTED   Listeria monocytogenes NOT DETECTED NOT DETECTED   Staphylococcus species NOT DETECTED NOT DETECTED   Staphylococcus aureus (BCID) NOT DETECTED NOT DETECTED   Staphylococcus epidermidis NOT DETECTED NOT DETECTED   Staphylococcus lugdunensis NOT DETECTED NOT DETECTED   Streptococcus species NOT DETECTED NOT DETECTED   Streptococcus agalactiae NOT DETECTED NOT DETECTED   Streptococcus pneumoniae NOT DETECTED NOT DETECTED   Streptococcus pyogenes NOT DETECTED NOT DETECTED   A.calcoaceticus-baumannii NOT DETECTED NOT DETECTED   Bacteroides fragilis NOT DETECTED NOT DETECTED   Enterobacterales DETECTED (A) NOT DETECTED   Enterobacter cloacae complex NOT DETECTED NOT DETECTED   Escherichia coli DETECTED (A) NOT DETECTED   Klebsiella aerogenes NOT DETECTED NOT DETECTED   Klebsiella oxytoca NOT DETECTED NOT DETECTED   Klebsiella pneumoniae NOT DETECTED NOT DETECTED   Proteus species NOT DETECTED NOT DETECTED   Salmonella species NOT DETECTED NOT DETECTED   Serratia marcescens NOT DETECTED NOT DETECTED   Haemophilus influenzae NOT DETECTED NOT DETECTED   Neisseria  meningitidis NOT DETECTED NOT DETECTED   Pseudomonas aeruginosa NOT DETECTED NOT DETECTED   Stenotrophomonas maltophilia NOT DETECTED NOT DETECTED   Candida albicans NOT DETECTED NOT DETECTED   Candida auris NOT DETECTED NOT DETECTED   Candida glabrata NOT DETECTED NOT DETECTED   Candida krusei NOT DETECTED NOT DETECTED   Candida parapsilosis NOT DETECTED NOT DETECTED   Candida tropicalis NOT DETECTED NOT DETECTED   Cryptococcus neoformans/gattii NOT DETECTED NOT DETECTED   CTX-M ESBL NOT DETECTED NOT DETECTED   Carbapenem resistance IMP NOT DETECTED NOT DETECTED   Carbapenem resistance KPC NOT DETECTED NOT DETECTED   Carbapenem resistance NDM NOT DETECTED NOT DETECTED   Carbapenem resist OXA 48 LIKE NOT DETECTED NOT DETECTED   Carbapenem resistance VIM NOT DETECTED NOT DETECTED    Everette Rank, PharmD 05/13/2020  4:25 AM

## 2020-05-13 NOTE — Plan of Care (Deleted)
  Problem: Education: Goal: Knowledge of disease or condition will improve 05/13/2020 0803 by Baker Pierini, RN Outcome: Progressing 05/13/2020 0241 by Baker Pierini, RN Outcome: Progressing Goal: Understanding of medication regimen will improve 05/13/2020 0803 by Baker Pierini, RN Outcome: Progressing 05/13/2020 0241 by Baker Pierini, RN Outcome: Progressing   Problem: Activity: Goal: Ability to tolerate increased activity will improve Outcome: Progressing   Problem: Cardiac: Goal: Ability to achieve and maintain adequate cardiopulmonary perfusion will improve Outcome: Progressing   Problem: Education: Goal: Knowledge of General Education information will improve Description: Including pain rating scale, medication(s)/side effects and non-pharmacologic comfort measures 05/13/2020 0803 by Baker Pierini, RN Outcome: Progressing 05/13/2020 0241 by Baker Pierini, RN Outcome: Progressing   Problem: Activity: Goal: Risk for activity intolerance will decrease 05/13/2020 0803 by Baker Pierini, RN Outcome: Progressing 05/13/2020 0241 by Baker Pierini, RN Outcome: Progressing   Problem: Coping: Goal: Level of anxiety will decrease 05/13/2020 0803 by Baker Pierini, RN Outcome: Progressing 05/13/2020 0241 by Baker Pierini, RN Outcome: Progressing   Problem: Elimination: Goal: Will not experience complications related to bowel motility 05/13/2020 0803 by Baker Pierini, RN Outcome: Progressing 05/13/2020 0241 by Baker Pierini, RN Outcome: Progressing Goal: Will not experience complications related to urinary retention 05/13/2020 0803 by Baker Pierini, RN Outcome: Progressing 05/13/2020 0241 by Baker Pierini, RN Outcome: Progressing   Problem: Pain Managment: Goal: General experience of comfort will improve 05/13/2020 0803 by Baker Pierini, RN Outcome: Progressing 05/13/2020 0241 by Baker Pierini, RN Outcome: Progressing   Problem:  Safety: Goal: Ability to remain free from injury will improve 05/13/2020 0803 by Baker Pierini, RN Outcome: Progressing 05/13/2020 0241 by Baker Pierini, RN Outcome: Progressing   Problem: Skin Integrity: Goal: Risk for impaired skin integrity will decrease 05/13/2020 0803 by Baker Pierini, RN Outcome: Progressing 05/13/2020 0241 by Baker Pierini, RN Outcome: Progressing

## 2020-05-13 NOTE — Evaluation (Signed)
Physical Therapy Evaluation Patient Details Name: John [name] MRN: [id number] DOB: 11-09-1960 Today's Date: 05/13/2020   History of Present Illness  60 yo male admitted with Afib with RVR, weakness, neutropenia. Hx of stage IV lung ca  Clinical Impression  On eval, pt was Min guard for mobility. Only stood on today 2* increased HR and orthostatic BP-see vitals section. Assisted pt back to bed to rest. Will plan to follow and progress activity as safely able. Currently not anticipating any f/u PT needs after discharge but will continue to update recommendations as necessary.     Follow Up Recommendations No PT follow up;Supervision for mobility/OOB    Equipment Recommendations  None recommended by PT    Recommendations for Other Services       Precautions / Restrictions Precautions Precautions: Fall Precaution Comments: monitor BP and HR Restrictions Weight Bearing Restrictions: No      Mobility  Bed Mobility Overal bed mobility: Modified Independent                  Transfers Overall transfer level: Needs assistance   Transfers: Sit to/from Stand Sit to Stand: Min guard         General transfer comment: Min guard for safety. Pt c/o some dizziness. See vitals section for BP-deferred any further activity  Ambulation/Gait                Stairs            Wheelchair Mobility    Modified Rankin (Stroke Patients Only)       Balance Overall balance assessment: Needs assistance           Standing balance-Leahy Scale: Good                               Pertinent Vitals/Pain Pain Assessment: Faces Faces Pain Scale: Hurts little more Pain Location: back Pain Descriptors / Indicators: Discomfort;Sore Pain Intervention(s): Limited activity within patient's tolerance    Home Living Family/patient expects to be discharged to:: Private residence Living Arrangements: Spouse/significant other Available Help at Discharge:  Family Type of Home: House Home Access: Stairs to enter     Home Layout: One level Home Equipment: Walker - standard;Wheelchair - manual      Prior Function Level of Independence: Independent               Hand Dominance        Extremity/Trunk Assessment   Upper Extremity Assessment Upper Extremity Assessment: Overall WFL for tasks assessed    Lower Extremity Assessment Lower Extremity Assessment: Overall WFL for tasks assessed    Cervical / Trunk Assessment Cervical / Trunk Assessment: Normal  Communication   Communication: No difficulties  Cognition Arousal/Alertness: Awake/alert Behavior During Therapy: WFL for tasks assessed/performed Overall Cognitive Status: Within Functional Limits for tasks assessed                                        General Comments      Exercises     Assessment/Plan    PT Assessment Patient needs continued PT services  PT Problem List Decreased mobility;Decreased strength;Decreased activity tolerance       PT Treatment Interventions Gait training;Therapeutic exercise;Balance training;DME instruction;Therapeutic activities;Functional mobility training;Patient/family education    PT Goals (Current goals can be found in the Care Plan section)  Acute  Rehab PT Goals Patient Stated Goal: to get/feel better PT Goal Formulation: With patient/family Time For Goal Achievement: 05/27/20 Potential to Achieve Goals: Good    Frequency Min 3X/week   Barriers to discharge        Co-evaluation               AM-PAC PT "6 Clicks" Mobility  Outcome Measure Help needed turning from your back to your side while in a flat bed without using bedrails?: None Help needed moving from lying on your back to sitting on the side of a flat bed without using bedrails?: None Help needed moving to and from a bed to a chair (including a wheelchair)?: A Little Help needed standing up from a chair using your arms (e.g.,  wheelchair or bedside chair)?: A Little Help needed to walk in hospital room?: A Little Help needed climbing 3-5 steps with a railing? : A Little 6 Click Score: 20    End of Session   Activity Tolerance:  (limited by BP and HR) Patient left: in bed;with call bell/phone within reach;with family/visitor present   PT Visit Diagnosis: Muscle weakness (generalized) (M62.81);Difficulty in walking, not elsewhere classified (R26.2)    Time: 1015-1030 PT Time Calculation (min) (ACUTE ONLY): 15 min   Charges:   PT Evaluation $PT Eval Moderate Complexity: [place], PT Acute Rehabilitation  Office: [phone] Pager: [phone]

## 2020-05-13 NOTE — Progress Notes (Signed)
PROGRESS NOTE  John [name]  DOB: 02-Nov-1960  PCP: [name], MD JJK:[id number]  DOA: 05/12/2020  LOS: 1 day   Chief Complaint  Patient presents with  . Weakness   Brief narrative: John [name] is a 60 y.o. male with PMH significant for squamous cell carcinoma of lung, rheumatoid arthritis, GERD.  Patient presented to the ED early this morning with complaint of generalized weakness.  He was started on systemic chemotherapy last week.  Postchemotherapy, patient started having diarrhea, sore throat, no nausea or vomiting, generalized weakness which became so severe to a point at which he could not even stand up without getting lightheaded.  In the ED, he was tachycardic up to 190s, EKG showed A. fib with RVR.  Blood pressure mostly normal.  He was started on Cardizem drip Labs showed sodium 126, WBC low at 0.3, D-dimer elevated to 2.91. CT angio of chest was obtained.  In addition to the known right upper lobe mass, it showed an intraluminal nonocclusive filling defect within the right lower lobe pulmonary artery in keeping with eccentric mural thrombus or tumor thrombus related to probable direct invasion of the vessel by the adjacent neoplastic mass. No CT evidence of thromboembolic disease involving the pulmonary arterial tree. Patient was admitted to hospitalist service.  Subjective: Patient was seen and examined this morning.   Continues to have diarrhea.  Continues to be in A. fib on Cardizem drip.   Sore throat improving with Magic mouthwash  Continues to have episodes of fever  Assessment/Plan: Severe sepsis Febrile neutropenia Acute diarrhea -1 week post chemotherapy.  Presented with persistent diarrhea, low-grade fever at home, tachycardia, neutropenia, lactic acidosis -Preliminary blood culture report showing E. coli.  Waiting for final blood culture report.  Pending stool culture report -Hemodynamically stable at this time.  Continue maintenance IV fluid.  Repeat  lactic acid level tomorrow Recent Labs  Lab 05/12/20 0244 05/12/20 0944 05/13/20 0410  WBC 0.3*  --  0.5*  LATICACIDVEN  --  2.2*  --    A. fib with RVR -Patient was started on Cardizem drip on admission.  Currently remains on the same.  Blood pressure on the lower side of normal due to diarrhea, electrolytes off.  Cardiology consult appreciated.  Recommended to continue Cardizem drip for now. -EF 60 to 65% -CHADSVASC score is 0.  I would not commit him to long-term anticoagulation for A. fib which seems to be secondary to post chemotherapy alteration in hemodynamics  Right lower lobe pulmonary artery filling defect -CTA of chest showed intraluminal nonocclusive filling defect which could be secondary to direct invasion of the vessel by the absence of neoplastic process or an eccentric mural thrombus related to the mass.  There is no evidence of primary pulmonary thromboembolism. Additionally, he is at risk of bleeding because of pancytopenia.  Platelet counts are already dropping.  I discussed CT findings with oncologist Dr. [name] on 3/28.  No need of anticoagulation.  Stage 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  -Patient was recently diagnosed in February 2022 with stage IV non-small cell lung cancer, squamous cell cancer with large right upper lobe mass, mediastinal lymphadenopathy and suspicious right adrenal gland mass.  He was seen by Dr. [name] at cancer center.  He underwent palliative radiotherapy, PET scan and a CT-guided core biopsy of the right adrenal gland.  Pancytopenia -Dropping hemoglobin without bleeding, low WBC count, platelets trending down as well.  Watch out. Recent Labs  Lab 05/12/20 0244 05/13/20 0410  WBC  0.3* 0.5*  NEUTROABS 0.0*  --   HGB 10.8* 9.0*  HCT 32.8* 27.0*  MCV 79.8* 81.3  PLT 117* 89*   Odynophagia -Probably related to radiation.  No thrush on oral exam.   -Start on Magic mouthwash with lidocaine before each meal.  Rheumatoid  arthritis -Continue leflunomide 20 mg daily at bedtime.  Last dose of monthly Simponi was on 3/26.  Hypovolemic hyponatremia Recent Labs  Lab 05/12/20 0244 05/12/20 1247 05/13/20 0410  NA 126* 130* 132*   Hypokalemia -Potassium level or more than 4, magnesium diet more than 2.  Replace potassium.  Check magnesium. Recent Labs  Lab 05/12/20 0244 05/12/20 1247 05/13/20 0410  K 3.8 3.6 3.5    GERD -Continue Prilosec 20 mg at bedtime  Depression Continue Paxil 10 mg daily  Mobility: Encourage ambulation. Code Status:   Code Status: Full Code  Nutritional status: Body mass index is 32.18 kg/m.     Diet Order            Diet Heart Room service appropriate? Yes; Fluid consistency: Thin  Diet effective now                 DVT prophylaxis: Lovenox subcu   Antimicrobials:  Broad-spectrum antibiotics. Fluid: Normal saline at 100 mL/h Consultants: Oncology Family Communication:  Wife at bedside  Status is: Inpatient  Remains inpatient appropriate because: Septic postchemotherapy, requiring IV antibiotics and IV fluid  Dispo: The patient is from: Home              Anticipated d/c is to: Home              Patient currently is not medically stable to d/c.   Difficult to place patient No   Infusions:  . sodium chloride 100 mL/hr at 05/13/20 0038  . cefTRIAXone (ROCEPHIN)  IV 2 g (05/13/20 0850)  . diltiazem (CARDIZEM) infusion 12.5 mg/hr (05/13/20 0839)    Scheduled Meds: . enoxaparin (LOVENOX) injection  40 mg Subcutaneous QHS  . magic mouthwash w/lidocaine  5 mL Oral TID AC & HS  . mouth rinse  15 mL Mouth Rinse BID  . PARoxetine  10 mg Oral Daily  . potassium chloride  40 mEq Oral Once    Antimicrobials: Anti-infectives (From admission, onward)   Start     Dose/Rate Route Frequency Ordered Stop   05/13/20 1000  cefTRIAXone (ROCEPHIN) 2 g in sodium chloride 0.9 % 100 mL IVPB        2 g 200 mL/hr over 30 Minutes Intravenous Every 24 hours 05/13/20 0428      05/12/20 1800  piperacillin-tazobactam (ZOSYN) IVPB 3.375 g  Status:  Discontinued        3.375 g 12.5 mL/hr over 240 Minutes Intravenous Every 8 hours 05/12/20 0909 05/13/20 0428   05/12/20 0945  piperacillin-tazobactam (ZOSYN) IVPB 3.375 g        3.375 g 100 mL/hr over 30 Minutes Intravenous  Once 05/12/20 0858 05/13/20 0821      PRN meds:    Objective: Vitals:   05/13/20 0923 05/13/20 1020  BP: (!) 92/49   Pulse: 94 (!) 135  Resp: 16   Temp: 98.3 F (36.8 C)   SpO2: 96%     Intake/Output Summary (Last 24 hours) at 05/13/2020 1356 Last data filed at 05/13/2020 4944 Gross per 24 hour  Intake 2007.19 ml  Output 0 ml  Net 2007.19 ml   Filed Weights   05/12/20 0231  Weight: 106.1 kg  Weight change:  Body mass index is 32.18 kg/m.   Physical Exam: General exam: Pleasant, middle-aged Caucasian male.  Not in physical distress Skin: No rashes, lesions or ulcers. HEENT: Atraumatic, normocephalic, no obvious bleeding.  No thrush on oral examination Lungs: Clear to auscultation bilaterally CVS: Rapid A. Fib, no murmur GI/Abd soft, nontender, nondistended, bowel sound present CNS: Alert, awake, oriented x3 Psychiatry: Mood appropriate Extremities: No pedal edema, no calf tenderness  Data Review: I have personally reviewed the laboratory data and studies available.  Recent Labs  Lab 05/12/20 0244 05/13/20 0410  WBC 0.3* 0.5*  NEUTROABS 0.0*  --   HGB 10.8* 9.0*  HCT 32.8* 27.0*  MCV 79.8* 81.3  PLT 117* 89*   Recent Labs  Lab 05/12/20 0244 05/12/20 1247 05/13/20 0410  NA 126* 130* 132*  K 3.8 3.6 3.5  CL 94* 96* 97*  CO2 21* 25 23  GLUCOSE 135* 133* 109*  BUN 21* 17 14  CREATININE 1.16 0.92 1.05  CALCIUM 8.2* 7.4* 7.7*    F/u labs ordered Unresulted Labs (From admission, onward)          Start     Ordered   05/14/20 0500  Lactic acid, plasma  Tomorrow morning,   R        05/13/20 0801   05/14/20 0500  CBC with Differential/Platelet  Daily,    R      05/13/20 1354   05/14/20 0037  Basic metabolic panel  Daily,   R      05/13/20 1354   05/14/20 0500  Magnesium  Tomorrow morning,   STAT        05/13/20 1355   05/14/20 0500  Phosphorus  Tomorrow morning,   R        05/13/20 1355   05/12/20 1047  Gastrointestinal Panel by PCR , Stool  (Gastrointestinal Panel by PCR, Stool                                                                                                                                                     **Does Not include CLOSTRIDIUM DIFFICILE testing. **If CDIFF testing is needed, place order from the "C Difficile Testing" order set.**)  Once,   R       Question:  Patient immune status  Answer:  Immunocompromised   05/12/20 1046          Signed, Terrilee Croak, MD [facility] 05/13/2020

## 2020-05-13 NOTE — Plan of Care (Signed)
  Problem: Education: Goal: Knowledge of disease or condition will improve Outcome: Progressing Goal: Understanding of medication regimen will improve Outcome: Progressing   Problem: Cardiac: Goal: Ability to achieve and maintain adequate cardiopulmonary perfusion will improve Outcome: Progressing   Problem: Education: Goal: Knowledge of General Education information will improve Description: Including pain rating scale, medication(s)/side effects and non-pharmacologic comfort measures Outcome: Progressing   Problem: Activity: Goal: Risk for activity intolerance will decrease Outcome: Progressing   Problem: Coping: Goal: Level of anxiety will decrease Outcome: Progressing   Problem: Elimination: Goal: Will not experience complications related to bowel motility Outcome: Progressing Goal: Will not experience complications related to urinary retention Outcome: Progressing   Problem: Pain Managment: Goal: General experience of comfort will improve Outcome: Progressing   Problem: Safety: Goal: Ability to remain free from injury will improve Outcome: Progressing   Problem: Skin Integrity: Goal: Risk for impaired skin integrity will decrease Outcome: Progressing

## 2020-05-14 ENCOUNTER — Ambulatory Visit: Payer: 59

## 2020-05-14 ENCOUNTER — Telehealth: Payer: Self-pay | Admitting: Medical Oncology

## 2020-05-14 DIAGNOSIS — R197 Diarrhea, unspecified: Secondary | ICD-10-CM | POA: Diagnosis not present

## 2020-05-14 DIAGNOSIS — I4891 Unspecified atrial fibrillation: Secondary | ICD-10-CM | POA: Diagnosis not present

## 2020-05-14 DIAGNOSIS — D701 Agranulocytosis secondary to cancer chemotherapy: Secondary | ICD-10-CM | POA: Diagnosis not present

## 2020-05-14 DIAGNOSIS — C3491 Malignant neoplasm of unspecified part of right bronchus or lung: Secondary | ICD-10-CM | POA: Diagnosis not present

## 2020-05-14 LAB — GASTROINTESTINAL PANEL BY PCR, STOOL (REPLACES STOOL CULTURE)

## 2020-05-14 LAB — CBC WITH DIFFERENTIAL/PLATELET
Abs Immature Granulocytes: 0.03 10*3/uL (ref 0.00–0.07)
Basophils Absolute: 0 10*3/uL (ref 0.0–0.1)
Basophils Relative: 1 %
Eosinophils Absolute: 0.1 10*3/uL (ref 0.0–0.5)
Eosinophils Relative: 5 %
HCT: 24.4 % — ABNORMAL LOW (ref 39.0–52.0)
Hemoglobin: 8 g/dL — ABNORMAL LOW (ref 13.0–17.0)
Immature Granulocytes: 2 %
Lymphocytes Relative: 12 %
Lymphs Abs: 0.2 10*3/uL — ABNORMAL LOW (ref 0.7–4.0)
MCH: 26.9 pg (ref 26.0–34.0)
MCHC: 32.8 g/dL (ref 30.0–36.0)
MCV: 82.2 fL (ref 80.0–100.0)
Monocytes Absolute: 0.3 10*3/uL (ref 0.1–1.0)
Monocytes Relative: 14 %
Neutro Abs: 1.2 10*3/uL — ABNORMAL LOW (ref 1.7–7.7)
Neutrophils Relative %: 66 %
Platelets: 90 10*3/uL — ABNORMAL LOW (ref 150–400)
RBC: 2.97 MIL/uL — ABNORMAL LOW (ref 4.22–5.81)
RDW: 14.6 % (ref 11.5–15.5)
WBC: 1.8 10*3/uL — ABNORMAL LOW (ref 4.0–10.5)
nRBC: 0 % (ref 0.0–0.2)

## 2020-05-14 LAB — BASIC METABOLIC PANEL
Anion gap: 12 (ref 5–15)
BUN: 13 mg/dL (ref 6–20)
CO2: 21 mmol/L — ABNORMAL LOW (ref 22–32)
Calcium: 7.6 mg/dL — ABNORMAL LOW (ref 8.9–10.3)
Chloride: 100 mmol/L (ref 98–111)
Creatinine, Ser: 1.01 mg/dL (ref 0.61–1.24)
GFR, Estimated: >60 mL/min (ref >60)
Glucose, Bld: 96 mg/dL (ref 70–99)
Potassium: 3.2 mmol/L — ABNORMAL LOW (ref 3.5–5.1)
Sodium: 133 mmol/L — ABNORMAL LOW (ref 135–145)

## 2020-05-14 LAB — LACTIC ACID, PLASMA: Lactic Acid, Venous: 1.2 mmol/L (ref 0.5–1.9)

## 2020-05-14 LAB — MAGNESIUM: Magnesium: 1.9 mg/dL (ref 1.7–2.4)

## 2020-05-14 LAB — C DIFFICILE QUICK SCREEN W PCR REFLEX
C Diff antigen: POSITIVE — AB
C Diff interpretation: DETECTED
C Diff toxin: POSITIVE — AB

## 2020-05-14 LAB — PHOSPHORUS: Phosphorus: 2.2 mg/dL — ABNORMAL LOW (ref 2.5–4.6)

## 2020-05-14 MED ORDER — DILTIAZEM HCL 60 MG PO TABS
60.0000 mg | ORAL_TABLET | Freq: Four times a day (QID) | ORAL | Status: DC
  Administered 2020-05-15 (×2): 60 mg via ORAL
  Filled 2020-05-14 (×3): qty 1

## 2020-05-14 MED ORDER — MAGNESIUM SULFATE 2 GM/50ML IV SOLN
2.0000 g | Freq: Once | INTRAVENOUS | Status: AC
  Administered 2020-05-14: 2 g via INTRAVENOUS
  Filled 2020-05-14: qty 50

## 2020-05-14 MED ORDER — POTASSIUM CHLORIDE CRYS ER 20 MEQ PO TBCR
40.0000 meq | EXTENDED_RELEASE_TABLET | Freq: Once | ORAL | Status: AC
  Administered 2020-05-14: 40 meq via ORAL
  Filled 2020-05-14: qty 2

## 2020-05-14 NOTE — Progress Notes (Signed)
Spoke with RN re: RE, very swollen and PIV infiltrated. Recommend ultrasound for the R arm. RN made aware.

## 2020-05-14 NOTE — Plan of Care (Signed)
  Problem: Health Behavior/Discharge Planning: Goal: Ability to manage health-related needs will improve Outcome: Progressing   Problem: Activity: Goal: Risk for activity intolerance will decrease Outcome: Progressing   Problem: Elimination: Goal: Will not experience complications related to bowel motility Outcome: Progressing   Problem: Pain Managment: Goal: General experience of comfort will improve Outcome: Progressing

## 2020-05-14 NOTE — Progress Notes (Signed)
Progress Note  Patient Name: John [name] Date of Encounter: 05/14/2020  [facility] Cardiologist: Fransico Him, MD   Subjective   Denies any chest pain.  SOB stable.  Tele shows atrial fibrillation now with HR in the 80's  Inpatient Medications    Scheduled Meds: . enoxaparin (LOVENOX) injection  40 mg Subcutaneous QHS  . magic mouthwash w/lidocaine  5 mL Oral TID AC & HS  . mouth rinse  15 mL Mouth Rinse BID  . PARoxetine  10 mg Oral Daily   Continuous Infusions: . sodium chloride 100 mL/hr at 05/14/20 0424  . cefTRIAXone (ROCEPHIN)  IV 2 g (05/13/20 0850)  . diltiazem (CARDIZEM) infusion 10 mg/hr (05/14/20 0425)   PRN Meds: acetaminophen, loperamide, ondansetron (ZOFRAN) IV   Vital Signs    Vitals:   05/13/20 1020 05/13/20 1457 05/13/20 2108 05/14/20 0455  BP:  91/60 110/69 (!) 90/55  Pulse: (!) 135 91 97 85  Resp:  18 20 18   Temp:  98.6 F (37 C) 98.3 F (36.8 C) 98.1 F (36.7 C)  TempSrc:  Oral Oral Oral  SpO2:  92% 93% 92%  Weight:      Height:        Intake/Output Summary (Last 24 hours) at 05/14/2020 1050 Last data filed at 05/14/2020 0548 Gross per 24 hour  Intake 1535.99 ml  Output --  Net 1535.99 ml   Last 3 Weights 05/12/2020 05/06/2020 04/28/2020  Weight (lbs) 234 lb 234 lb 11.2 oz 234 lb 11.2 oz  Weight (kg) 106.142 kg 106.459 kg 106.459 kg      Telemetry    Atrial fibrillation with CVR - Personally Reviewed  ECG    No new EKG to review - Personally Reviewed  Physical Exam   GEN: No acute distress.   Neck: No JVD Cardiac: irregularly irregular, no murmurs, rubs, or gallops.  Respiratory: Clear to auscultation bilaterally. GI: Soft, nontender, non-distended  MS: No edema; No deformity. Neuro:  Nonfocal  Psych: Normal affect   Labs    High Sensitivity Troponin:   Recent Labs  Lab 05/12/20 0244 05/12/20 0444 05/12/20 1056 05/12/20 1247  TROPONINIHS 7 7 8 7       Chemistry Recent Labs  Lab 05/12/20 0254  05/12/20 1247 05/13/20 0410 05/14/20 0424  NA  --  130* 132* 133*  K  --  3.6 3.5 3.2*  CL  --  96* 97* 100  CO2  --  25 23 21*  GLUCOSE  --  133* 109* 96  BUN  --  17 14 13   CREATININE  --  0.92 1.05 1.01  CALCIUM  --  7.4* 7.7* 7.6*  PROT 6.7  --   --   --   ALBUMIN 3.2*  --   --   --   AST 17  --   --   --   ALT 14  --   --   --   ALKPHOS 82  --   --   --   BILITOT 2.8*  --   --   --   GFRNONAA  --  >60 >60 >60  ANIONGAP  --  9 12 12      Hematology Recent Labs  Lab 05/12/20 0244 05/13/20 0410 05/14/20 0424  WBC 0.3* 0.5* 1.8*  RBC 4.11* 3.32* 2.97*  HGB 10.8* 9.0* 8.0*  HCT 32.8* 27.0* 24.4*  MCV 79.8* 81.3 82.2  MCH 26.3 27.1 26.9  MCHC 32.9 33.3 32.8  RDW 14.3 14.5 14.6  PLT  117* 89* 90*    BNPNo results for input(s): BNP, PROBNP in the last 168 hours.   DDimer  Recent Labs  Lab 05/12/20 0244  DDIMER 2.91*     CHA2DS2-VASc Score = 0  This indicates a 0.2% annual risk of stroke. The patient's score is based upon: CHF History: No HTN History: No Diabetes History: No Stroke History: No Vascular Disease History: No Age Score: 0 Gender Score: 0       Radiology    ECHOCARDIOGRAM COMPLETE  Result Date: 05/12/2020    ECHOCARDIOGRAM REPORT   Patient Name:   John [name] Date of Exam: 05/12/2020 Medical Rec #:  [id number]     Height:       71.5 in Accession #:    [id number]    Weight:       234.0 lb Date of Birth:  Jul 07, 1960     BSA:          2.266 m Patient Age:    60 years      BP:           100/78 mmHg Patient Gender: M             HR:           116 bpm. Exam Location:  Inpatient Procedure: 2D Echo, Cardiac Doppler and Color Doppler Indications:    Atrial fibrillation  History:        Patient has no prior history of Echocardiogram examinations.                 Arrythmias:Atrial Fibrillation; Signs/Symptoms:Lung cancer,                 chemo, radiation therapy.  Sonographer:    Dustin Flock Referring Phys: [id number] [place]  Comments: Image acquisition challenging due to respiratory motion. IMPRESSIONS  1. Left ventricular ejection fraction, by estimation, is 60 to 65%. The left ventricle has normal function. The left ventricle has no regional wall motion abnormalities. Left ventricular diastolic parameters are indeterminate.  2. Right ventricular systolic function is normal. The right ventricular size is normal. Tricuspid regurgitation signal is inadequate for assessing PA pressure.  3. The mitral valve is normal in structure. Trivial mitral valve regurgitation. No evidence of mitral stenosis.  4. The aortic valve is tricuspid. Aortic valve regurgitation is not visualized. No aortic stenosis is present.  5. The inferior vena cava is normal in size with greater than 50% respiratory variability, suggesting right atrial pressure of 3 mmHg.  6. The patient is in atrial fibrillation. FINDINGS  Left Ventricle: Left ventricular ejection fraction, by estimation, is 60 to 65%. The left ventricle has normal function. The left ventricle has no regional wall motion abnormalities. The left ventricular internal cavity size was normal in size. There is  no left ventricular hypertrophy. Left ventricular diastolic parameters are indeterminate. Right Ventricle: The right ventricular size is normal. No increase in right ventricular wall thickness. Right ventricular systolic function is normal. Tricuspid regurgitation signal is inadequate for assessing PA pressure. Left Atrium: Left atrial size was normal in size. Right Atrium: Right atrial size was normal in size. Pericardium: There is no evidence of pericardial effusion. Mitral Valve: The mitral valve is normal in structure. Trivial mitral valve regurgitation. No evidence of mitral valve stenosis. Tricuspid Valve: The tricuspid valve is normal in structure. Tricuspid valve regurgitation is not demonstrated. Aortic Valve: The aortic valve is tricuspid. Aortic valve regurgitation is not visualized. No  aortic stenosis is  present. Pulmonic Valve: The pulmonic valve was normal in structure. Pulmonic valve regurgitation is not visualized. Aorta: The aortic root is normal in size and structure. Venous: The inferior vena cava is normal in size with greater than 50% respiratory variability, suggesting right atrial pressure of 3 mmHg. IAS/Shunts: No atrial level shunt detected by color flow Doppler.  LEFT VENTRICLE PLAX 2D LVIDd:         5.30 cm  Diastology LVIDs:         3.90 cm  LV e' medial:    6.74 cm/s LV PW:         1.10 cm  LV E/e' medial:  11.4 LV IVS:        0.90 cm  LV e' lateral:   6.85 cm/s LVOT diam:     2.20 cm  LV E/e' lateral: 11.2 LV SV:         73 LV SV Index:   32 LVOT Area:     3.80 cm  RIGHT VENTRICLE RV Basal diam:  2.40 cm RV S prime:     9.68 cm/s TAPSE (M-mode): 2.4 cm LEFT ATRIUM             Index       RIGHT ATRIUM           Index LA diam:        3.40 cm 1.50 cm/m  RA Area:     14.40 cm LA Vol (A2C):   48.1 ml 21.23 ml/m RA Volume:   39.50 ml  17.43 ml/m LA Vol (A4C):   39.6 ml 17.48 ml/m LA Biplane Vol: 45.1 ml 19.90 ml/m  AORTIC VALVE LVOT Vmax:   111.00 cm/s LVOT Vmean:  67.600 cm/s LVOT VTI:    0.191 m  AORTA Ao Root diam: 3.50 cm MITRAL VALVE MV Area (PHT): 5.54 cm    SHUNTS MV Decel Time: 137 msec    Systemic VTI:  0.19 m MV E velocity: 76.70 cm/s  Systemic Diam: 2.20 cm MV A velocity: 65.10 cm/s MV E/A ratio:  1.18 Loralie Champagne MD Electronically signed by Loralie Champagne MD Signature Date/Time: 05/12/2020/3:33:39 PM    Final     Cardiac Studies   2D echo 05/12/2020 IMPRESSIONS   1. Left ventricular ejection fraction, by estimation, is 60 to 65%. The  left ventricle has normal function. The left ventricle has no regional  wall motion abnormalities. Left ventricular diastolic parameters are  indeterminate.  2. Right ventricular systolic function is normal. The right ventricular  size is normal. Tricuspid regurgitation signal is inadequate for assessing  PA pressure.   3. The mitral valve is normal in structure. Trivial mitral valve  regurgitation. No evidence of mitral stenosis.  4. The aortic valve is tricuspid. Aortic valve regurgitation is not  visualized. No aortic stenosis is present.  5. The inferior vena cava is normal in size with greater than 50%  respiratory variability, suggesting right atrial pressure of 3 mmHg.  6. The patient is in atrial fibrillation.   Patient Profile     60 y.o. 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  Assessment & Plan    1.  Atrial fib, RVR - HR 190s on admit, improved w/ IVF and IV Cardizem - HR now controlled in afib in the 80's - 2D echo with normal  LVF - will change to PO Cardizem 60mg  q6 hours - his anticoagulation was stopped due to concern of ongoing need for chemo and pancytopenia - his platelet count is 90K and should not be a contraindication at this point for anticoagulation but he does have a lung mass with possible invasion into PA which increases bleeding risks on anticoagulation - although he has a low CHADS2VASC score, he is at risk after being in afib for > 48 hours of cardioembolic events - will leave final decision on Eliquis to Oncology and Rheems  2. Squamous cell lung CA - started chemo, sig side effects - per Dr Mohammed/IM  3.  Orthostatic hypotension -he had had dizziness and orthostatics were positive on admission  -likely related to ongoing diarrhea with chemo -getting IVF hydration but BP remains soft -add compression hose -may need to consider addition of Midodrine  4.  Hypokalemia -replete to keep Mag > 2 and K+ > 4 -check BMET in am  For questions or updates, please contact [place] Please consult [email or web address] for contact info under        Signed, Fransico Him, MD  05/14/2020, 10:50 AM

## 2020-05-14 NOTE — Telephone Encounter (Signed)
Information provided to wife re keeping appts.

## 2020-05-14 NOTE — Progress Notes (Addendum)
[facility] PROGRESS NOTE   John [name] YTK:[id number] DOB: [birth year] DOA: 05/12/2020  PCP: [name], MD  Brief History/Interval Summary: 60 y.o. male with PMH significant for squamous cell carcinoma of lung, rheumatoid arthritis, GERD.  Presented to the emergency department with complaints of generalized weakness.  He was started on systemic chemotherapy recently.  Patient was noted to be hyponatremic and neutropenic.  Had elevated D-dimer.  He was noted to be tachycardic.  Noted to have rapid atrial fibrillation.  He was hospitalized for further management.  Consultants: Medical oncology is following.  Infectious disease.  Cardiology  Procedures:    Transthoracic echocardiogram IMPRESSIONS  1. Left ventricular ejection fraction, by estimation, is 60 to 65%. The left ventricle has normal function. The left ventricle has no regional wall motion abnormalities. Left ventricular diastolic parameters are indeterminate.  2. Right ventricular systolic function is normal. The right ventricular size is normal. Tricuspid regurgitation signal is inadequate for assessing PA pressure.  3. The mitral valve is normal in structure. Trivial mitral valve regurgitation. No evidence of mitral stenosis.  4. The aortic valve is tricuspid. Aortic valve regurgitation is not visualized. No aortic stenosis is present.  5. The inferior vena cava is normal in size with greater than 50% respiratory variability, suggesting right atrial pressure of 3 mmHg.  6. The patient is in atrial fibrillation.  Antibiotics: Anti-infectives (From admission, onward)   Start     Dose/Rate Route Frequency Ordered Stop   05/13/20 1000  cefTRIAXone (ROCEPHIN) 2 g in sodium chloride 0.9 % 100 mL IVPB        2 g 200 mL/hr over 30 Minutes Intravenous Every 24 hours 05/13/20 0428     05/12/20 1800  piperacillin-tazobactam (ZOSYN) IVPB 3.375 g  Status:  Discontinued        3.375 g 12.5 mL/hr over 240 Minutes Intravenous  Every 8 hours 05/12/20 0909 05/13/20 0428   05/12/20 0945  piperacillin-tazobactam (ZOSYN) IVPB 3.375 g        3.375 g 100 mL/hr over 30 Minutes Intravenous  Once 05/12/20 0858 05/13/20 0821      Subjective/Interval History: Patient denies any chest pain or shortness of breath this morning.  No nausea or vomiting.  He does mention loose stools.  Denies any abdominal pain.  No blood in the stool.     Assessment/Plan:  Acute diarrhea/severe sepsis/febrile neutropenia He was 1 week post chemotherapy.  Noted to have neutropenia.  Had low-grade fever at home.  Presented with tachycardia.  Found to be bacteremic with E. coli.  Seems to be improving from sepsis standpoint.  Neutrophil count is improving.  It is up to 1200 today. Reason for diarrhea is not entirely clear.  GI pathogen panel is pending.  E. coli bacteremia Infectious diseases following.  Blood cultures repeated yesterday.  If negative could be transition to oral antibiotics.  He remains afebrile.  WBC is improving.  Atrial fibrillation with RVR Cardiology is following.  Remains on Cardizem infusion.  Heart rate seems to be reasonably well controlled.  Echocardiogram showed normal systolic function.  TSH was normal at 2.1. Blood pressure noted to be borderline low. Cardiology notes reviewed.  They do recommend anticoagulation.  Discussed again with Dr. [name] regarding the findings of the CT scan.  He has no opposition to anticoagulation if indicated for other reasons.  We will wait for stability in blood counts before starting him on Eliquis.  Hopefully tomorrow.  Right lower lobe pulmonary artery filling defect CT angiogram  of the chest showed a intraluminal nonocclusive filling defect which could be secondary to direct invasion of the vessel or an eccentric mural thrombus related to mass.  No primary pulmonary thromboembolism was noted.  Previous rounding physician discussed findings with patient's oncologist who did not  recommend anticoagulation at this time for this particular issue.  See discussion under atrial fibrillation..  Stage IV squamous Cell carcinoma of right lung Diagnosed earlier this year.  Followed by Dr. [name].  Status post chemotherapy last week.  Normocytic anemia and thrombocytopenia Most likely secondary to chemotherapeutic effect.  Counts are stable.  No evidence of overt bleeding.  Odynophagia Probably related to radiation.  No thrush was noted on exam.  Mouthwash.  History of rheumatoid arthritis On leflunomide as well as Simponi  Hyponatremia secondary to hypovolemia Sodium counts have improved.  Hypokalemia Will be repleted.  Magnesium 1.9.  We will replete this as well.  GERD Continue PPI.  History of depression Continue Paxil.  Obesity Estimated body mass index is 32.18 kg/m as calculated from the following:   Height as of this encounter: 5' 11.5" (1.816 m).   Weight as of this encounter: 106.1 kg.    DVT Prophylaxis: Noted to be on Lovenox Code Status: Full code Family Communication: Discussed with the patient Disposition Plan: Hopefully return home when improved  Status is: Inpatient  Remains inpatient appropriate because:IV treatments appropriate due to intensity of illness or inability to take PO and Inpatient level of care appropriate due to severity of illness   Dispo: The patient is from: Home              Anticipated d/c is to: Home              Patient currently is not medically stable to d/c.   Difficult to place patient No       Medications:  Scheduled: . diltiazem  60 mg Oral Q6H  . enoxaparin (LOVENOX) injection  40 mg Subcutaneous QHS  . magic mouthwash w/lidocaine  5 mL Oral TID AC & HS  . mouth rinse  15 mL Mouth Rinse BID  . PARoxetine  10 mg Oral Daily   Continuous: . sodium chloride 100 mL/hr at 05/14/20 0424  . cefTRIAXone (ROCEPHIN)  IV 2 g (05/13/20 0850)   JZP:[id number], loperamide, ondansetron (ZOFRAN)  IV   Objective:  Vital Signs  Vitals:   05/13/20 1020 05/13/20 1457 05/13/20 2108 05/14/20 0455  BP:  91/60 110/69 (!) 90/55  Pulse: (!) 135 91 97 85  Resp:  18 20 18   Temp:  98.6 F (37 C) 98.3 F (36.8 C) 98.1 F (36.7 C)  TempSrc:  Oral Oral Oral  SpO2:  92% 93% 92%  Weight:      Height:        Intake/Output Summary (Last 24 hours) at 05/14/2020 1100 Last data filed at 05/14/2020 0548 Gross per 24 hour  Intake 1535.99 ml  Output --  Net 1535.99 ml   Filed Weights   05/12/20 0231  Weight: 106.1 kg    General appearance: Awake alert.  In no distress Resp: Clear to auscultation bilaterally.  Normal effort Cardio: S1-S2 is irregularly irregular.  No S3-S4.  No rubs or bruit. GI: Abdomen is soft.  Nontender nondistended.  Bowel sounds are present normal.  No masses organomegaly Extremities: No edema.  Full range of motion of lower extremities. Neurologic: Alert and oriented x3.  No focal neurological deficits.    Lab Results:  Data Reviewed: I  have personally reviewed following labs and imaging studies  CBC: Recent Labs  Lab 05/12/20 0244 05/13/20 0410 05/14/20 0424  WBC 0.3* 0.5* 1.8*  NEUTROABS 0.0*  --  1.2*  HGB 10.8* 9.0* 8.0*  HCT 32.8* 27.0* 24.4*  MCV 79.8* 81.3 82.2  PLT 117* 89* 90*    Basic Metabolic Panel: Recent Labs  Lab 05/12/20 0244 05/12/20 1247 05/13/20 0410 05/14/20 0424  NA 126* 130* 132* 133*  K 3.8 3.6 3.5 3.2*  CL 94* 96* 97* 100  CO2 21* 25 23 21*  GLUCOSE 135* 133* 109* 96  BUN 21* 17 14 13   CREATININE 1.16 0.92 1.05 1.01  CALCIUM 8.2* 7.4* 7.7* 7.6*  MG  --   --   --  1.9  PHOS  --   --   --  2.2*    GFR: Estimated Creatinine Clearance: 99.6 mL/min (by C-G formula based on SCr of 1.01 mg/dL).  Liver Function Tests: Recent Labs  Lab 05/12/20 0254  AST 17  ALT 14  ALKPHOS 82  BILITOT 2.8*  PROT 6.7  ALBUMIN 3.2*     Coagulation Profile: Recent Labs  Lab 05/12/20 0508  INR 1.1      Recent  Results (from the past 240 hour(s))  SARS CORONAVIRUS 2 (TAT 6-24 HRS) Nasopharyngeal Nasopharyngeal Swab     Status: None   Collection Time: 05/12/20  5:09 AM   Specimen: Nasopharyngeal Swab  Result Value Ref Range Status   SARS Coronavirus 2 NEGATIVE NEGATIVE Final    Comment: (NOTE) SARS-CoV-2 target nucleic acids are NOT DETECTED.  The SARS-CoV-2 RNA is generally detectable in upper and lower respiratory specimens during the acute phase of infection. Negative results do not preclude SARS-CoV-2 infection, do not rule out co-infections with other pathogens, and should not be used as the sole basis for treatment or other patient management decisions. Negative results must be combined with clinical observations, patient history, and epidemiological information. The expected result is Negative.  Fact Sheet for Patients: SugarRoll.be  Fact Sheet for Healthcare Providers: https://www.woods-mathews.com/  This test is not yet approved or cleared by the [place] FDA and  has been authorized for detection and/or diagnosis of SARS-CoV-2 by FDA under an Emergency Use Authorization (EUA). This EUA will remain  in effect (meaning this test can be used) for the duration of the COVID-19 declaration under Se ction 564(b)(1) of the Act, 21 U.S.C. section 360bbb-3(b)(1), unless the authorization is terminated or revoked sooner.  Performed at [facility], [place] [address], [place], [place] [address]   Culture, blood (x 2)     Status: None (Preliminary result)   Collection Time: 05/12/20  9:43 AM   Specimen: BLOOD RIGHT HAND  Result Value Ref Range Status   Specimen Description   Final    BLOOD RIGHT HAND Performed at [facility] [address] [place], [place] [address]    Special Requests   Final    BOTTLES DRAWN AEROBIC ONLY Blood Culture adequate volume Performed at [facility] [address], [place], [place] [address]    Culture   Final    NO GROWTH 2 DAYS Performed at [facility] [address], [place], [place] [address]    Report Status PENDING  Incomplete  Culture, blood (x 2)     Status: Abnormal (Preliminary result)   Collection Time: 05/12/20  9:43 AM   Specimen: Right Antecubital; Blood  Result Value Ref Range Status  Specimen Description   Final    RIGHT ANTECUBITAL Performed at [facility] [address] [place], [place] [address]    Special Requests   Final    BOTTLES DRAWN AEROBIC AND ANAEROBIC Blood Culture adequate volume Performed at [facility] [address], [place], [place] [address]    Culture  Setup Time   Final    IN BOTH AEROBIC AND ANAEROBIC BOTTLES GRAM NEGATIVE RODS Organism ID to follow CRITICAL RESULT CALLED TO, READ BACK BY AND VERIFIED WITH: L POINDEXTER PHARMD 05/13/20 0405 JDW Performed at [facility], [place] [address], [place], [place] [address]    Culture ESCHERICHIA COLI (A)  Final   Report Status PENDING  Incomplete  Blood Culture ID Panel (Reflexed)     Status: Abnormal   Collection Time: 05/12/20  9:43 AM  Result Value Ref Range Status   Enterococcus faecalis NOT DETECTED NOT DETECTED Final   Enterococcus Faecium NOT DETECTED NOT DETECTED Final   Listeria monocytogenes NOT DETECTED NOT DETECTED Final   Staphylococcus species NOT DETECTED NOT DETECTED Final   Staphylococcus aureus (BCID) NOT DETECTED NOT DETECTED Final   Staphylococcus epidermidis NOT DETECTED NOT DETECTED Final   Staphylococcus lugdunensis NOT DETECTED NOT DETECTED Final   Streptococcus species NOT DETECTED NOT DETECTED Final   Streptococcus agalactiae NOT DETECTED NOT DETECTED Final   Streptococcus pneumoniae NOT DETECTED NOT DETECTED Final   Streptococcus pyogenes NOT DETECTED NOT DETECTED Final   A.calcoaceticus-baumannii NOT DETECTED NOT DETECTED Final   Bacteroides fragilis NOT DETECTED NOT  DETECTED Final   Enterobacterales DETECTED (A) NOT DETECTED Final    Comment: Enterobacterales represent a large order of gram negative bacteria, not a single organism. CRITICAL RESULT CALLED TO, READ BACK BY AND VERIFIED WITH: L POINDEXTER PHAMRD 05/13/20 0405 JDW    Enterobacter cloacae complex NOT DETECTED NOT DETECTED Final   Escherichia coli DETECTED (A) NOT DETECTED Final    Comment: CRITICAL RESULT CALLED TO, READ BACK BY AND VERIFIED WITH: L POINDEXTER PHARMD 05/13/20 0405 JDW    Klebsiella aerogenes NOT DETECTED NOT DETECTED Final   Klebsiella oxytoca NOT DETECTED NOT DETECTED Final   Klebsiella pneumoniae NOT DETECTED NOT DETECTED Final   Proteus species NOT DETECTED NOT DETECTED Final   Salmonella species NOT DETECTED NOT DETECTED Final   Serratia marcescens NOT DETECTED NOT DETECTED Final   Haemophilus influenzae NOT DETECTED NOT DETECTED Final   Neisseria meningitidis NOT DETECTED NOT DETECTED Final   Pseudomonas aeruginosa NOT DETECTED NOT DETECTED Final   Stenotrophomonas maltophilia NOT DETECTED NOT DETECTED Final   Candida albicans NOT DETECTED NOT DETECTED Final   Candida auris NOT DETECTED NOT DETECTED Final   Candida glabrata NOT DETECTED NOT DETECTED Final   Candida krusei NOT DETECTED NOT DETECTED Final   Candida parapsilosis NOT DETECTED NOT DETECTED Final   Candida tropicalis NOT DETECTED NOT DETECTED Final   Cryptococcus neoformans/gattii NOT DETECTED NOT DETECTED Final   CTX-M ESBL NOT DETECTED NOT DETECTED Final   Carbapenem resistance IMP NOT DETECTED NOT DETECTED Final   Carbapenem resistance KPC NOT DETECTED NOT DETECTED Final   Carbapenem resistance NDM NOT DETECTED NOT DETECTED Final   Carbapenem resist OXA 48 LIKE NOT DETECTED NOT DETECTED Final   Carbapenem resistance VIM NOT DETECTED NOT DETECTED Final    Comment: Performed at [facility], [address] [place], [place] [address]  Culture, blood (routine x 2)     Status: None (Preliminary  result)  Collection Time: 05/13/20  2:53 PM   Specimen: BLOOD  Result Value Ref Range Status   Specimen Description   Final    BLOOD RIGHT ANTECUBITAL Performed at [facility] [address], [place], [place] [address]    Special Requests   Final    BOTTLES DRAWN AEROBIC AND ANAEROBIC Blood Culture adequate volume Performed at [facility] [address] [place], [place] [address]    Culture   Final    NO GROWTH < 12 HOURS Performed at [facility] [address] [place], [place] [address]    Report Status PENDING  Incomplete  Culture, blood (routine x 2)     Status: None (Preliminary result)   Collection Time: 05/13/20  2:53 PM   Specimen: BLOOD RIGHT HAND  Result Value Ref Range Status   Specimen Description   Final    BLOOD RIGHT HAND Performed at [facility] [address], [place], [place] [address]    Special Requests   Final    BOTTLES DRAWN AEROBIC AND ANAEROBIC Blood Culture adequate volume Performed at [facility] [address], [place], [place] [address]    Culture   Final    NO GROWTH < 12 HOURS Performed at [facility] [address], [place], [place] [address]    Report Status PENDING  Incomplete      Radiology Studies: ECHOCARDIOGRAM COMPLETE  Result Date: 05/12/2020    ECHOCARDIOGRAM REPORT   Patient Name:   [facility] Date of Exam: 05/12/2020 Medical Rec #:  [id number]     Height:       71.5 in Accession #:    [id number]    Weight:       234.0 lb Date of Birth:  30-Dec-1960     BSA:          2.266 m Patient Age:    40 years      BP:           100/78 mmHg Patient Gender: M             HR:           116 bpm. Exam Location:  Inpatient Procedure: 2D Echo, Cardiac Doppler and Color Doppler Indications:    Atrial fibrillation  History:        Patient has no prior history of Echocardiogram examinations.                 Arrythmias:Atrial Fibrillation;  Signs/Symptoms:Lung cancer,                 chemo, radiation therapy.  Sonographer:    Dustin Flock Referring Phys: [id number] [place] Comments: Image acquisition challenging due to respiratory motion. IMPRESSIONS  1. Left ventricular ejection fraction, by estimation, is 60 to 65%. The left ventricle has normal function. The left ventricle has no regional wall motion abnormalities. Left ventricular diastolic parameters are indeterminate.  2. Right ventricular systolic function is normal. The right ventricular size is normal. Tricuspid regurgitation signal is inadequate for assessing PA pressure.  3. The mitral valve is normal in structure. Trivial mitral valve regurgitation. No evidence of mitral stenosis.  4. The aortic valve is tricuspid. Aortic valve regurgitation is not visualized. No aortic stenosis is present.  5. The inferior vena cava is normal in size with greater than 50% respiratory variability, suggesting right atrial pressure of 3 mmHg.  6. The patient is in  atrial fibrillation. FINDINGS  Left Ventricle: Left ventricular ejection fraction, by estimation, is 60 to 65%. The left ventricle has normal function. The left ventricle has no regional wall motion abnormalities. The left ventricular internal cavity size was normal in size. There is  no left ventricular hypertrophy. Left ventricular diastolic parameters are indeterminate. Right Ventricle: The right ventricular size is normal. No increase in right ventricular wall thickness. Right ventricular systolic function is normal. Tricuspid regurgitation signal is inadequate for assessing PA pressure. Left Atrium: Left atrial size was normal in size. Right Atrium: Right atrial size was normal in size. Pericardium: There is no evidence of pericardial effusion. Mitral Valve: The mitral valve is normal in structure. Trivial mitral valve regurgitation. No evidence of mitral valve stenosis. Tricuspid Valve: The tricuspid valve is normal  in structure. Tricuspid valve regurgitation is not demonstrated. Aortic Valve: The aortic valve is tricuspid. Aortic valve regurgitation is not visualized. No aortic stenosis is present. Pulmonic Valve: The pulmonic valve was normal in structure. Pulmonic valve regurgitation is not visualized. Aorta: The aortic root is normal in size and structure. Venous: The inferior vena cava is normal in size with greater than 50% respiratory variability, suggesting right atrial pressure of 3 mmHg. IAS/Shunts: No atrial level shunt detected by color flow Doppler.  LEFT VENTRICLE PLAX 2D LVIDd:         5.30 cm  Diastology LVIDs:         3.90 cm  LV e' medial:    6.74 cm/s LV PW:         1.10 cm  LV E/e' medial:  11.4 LV IVS:        0.90 cm  LV e' lateral:   6.85 cm/s LVOT diam:     2.20 cm  LV E/e' lateral: 11.2 LV SV:         73 LV SV Index:   32 LVOT Area:     3.80 cm  RIGHT VENTRICLE RV Basal diam:  2.40 cm RV S prime:     9.68 cm/s TAPSE (M-mode): 2.4 cm LEFT ATRIUM             Index       RIGHT ATRIUM           Index LA diam:        3.40 cm 1.50 cm/m  RA Area:     14.40 cm LA Vol (A2C):   48.1 ml 21.23 ml/m RA Volume:   39.50 ml  17.43 ml/m LA Vol (A4C):   39.6 ml 17.48 ml/m LA Biplane Vol: 45.1 ml 19.90 ml/m  AORTIC VALVE LVOT Vmax:   111.00 cm/s LVOT Vmean:  67.600 cm/s LVOT VTI:    0.191 m  AORTA Ao Root diam: 3.50 cm MITRAL VALVE MV Area (PHT): 5.54 cm    SHUNTS MV Decel Time: 137 msec    Systemic VTI:  0.19 m MV E velocity: 76.70 cm/s  Systemic Diam: 2.20 cm MV A velocity: 65.10 cm/s MV E/A ratio:  1.18 Loralie Champagne MD Electronically signed by Loralie Champagne MD Signature Date/Time: 05/12/2020/3:33:39 PM    Final        LOS: 2 days   [facility] Pager on [email or web address]  05/14/2020, 11:00 AM

## 2020-05-15 ENCOUNTER — Encounter: Payer: Self-pay | Admitting: Internal Medicine

## 2020-05-15 ENCOUNTER — Inpatient Hospital Stay (HOSPITAL_COMMUNITY): Payer: 59

## 2020-05-15 ENCOUNTER — Ambulatory Visit
Admission: RE | Admit: 2020-05-15 | Discharge: 2020-05-15 | Disposition: A | Discharge status: Home / Self Care | Payer: 59 | Source: Ambulatory Visit | Attending: Radiation Oncology | Admitting: Radiation Oncology

## 2020-05-15 DIAGNOSIS — D703 Neutropenia due to infection: Secondary | ICD-10-CM | POA: Diagnosis not present

## 2020-05-15 DIAGNOSIS — I4891 Unspecified atrial fibrillation: Secondary | ICD-10-CM | POA: Diagnosis not present

## 2020-05-15 DIAGNOSIS — I951 Orthostatic hypotension: Secondary | ICD-10-CM

## 2020-05-15 DIAGNOSIS — B9620 Unspecified Escherichia coli [E. coli] as the cause of diseases classified elsewhere: Secondary | ICD-10-CM

## 2020-05-15 DIAGNOSIS — R7881 Bacteremia: Secondary | ICD-10-CM | POA: Diagnosis not present

## 2020-05-15 DIAGNOSIS — M7989 Other specified soft tissue disorders: Secondary | ICD-10-CM | POA: Diagnosis not present

## 2020-05-15 DIAGNOSIS — B965 Pseudomonas (aeruginosa) (mallei) (pseudomallei) as the cause of diseases classified elsewhere: Secondary | ICD-10-CM

## 2020-05-15 DIAGNOSIS — K1230 Oral mucositis (ulcerative), unspecified: Secondary | ICD-10-CM

## 2020-05-15 DIAGNOSIS — A0472 Enterocolitis due to Clostridium difficile, not specified as recurrent: Secondary | ICD-10-CM

## 2020-05-15 DIAGNOSIS — R5081 Fever presenting with conditions classified elsewhere: Secondary | ICD-10-CM | POA: Diagnosis not present

## 2020-05-15 DIAGNOSIS — C3491 Malignant neoplasm of unspecified part of right bronchus or lung: Secondary | ICD-10-CM | POA: Diagnosis not present

## 2020-05-15 DIAGNOSIS — D701 Agranulocytosis secondary to cancer chemotherapy: Secondary | ICD-10-CM | POA: Diagnosis not present

## 2020-05-15 DIAGNOSIS — R197 Diarrhea, unspecified: Secondary | ICD-10-CM | POA: Diagnosis not present

## 2020-05-15 LAB — CULTURE, BLOOD (ROUTINE X 2): Special Requests: ADEQUATE

## 2020-05-15 LAB — CBC WITH DIFFERENTIAL/PLATELET
Abs Immature Granulocytes: 0.05 10*3/uL (ref 0.00–0.07)
Basophils Absolute: 0 10*3/uL (ref 0.0–0.1)
Basophils Relative: 1 %
Eosinophils Absolute: 0.1 10*3/uL (ref 0.0–0.5)
Eosinophils Relative: 4 %
HCT: 25.4 % — ABNORMAL LOW (ref 39.0–52.0)
Hemoglobin: 8.2 g/dL — ABNORMAL LOW (ref 13.0–17.0)
Immature Granulocytes: 2 %
Lymphocytes Relative: 10 %
Lymphs Abs: 0.3 10*3/uL — ABNORMAL LOW (ref 0.7–4.0)
MCH: 26.6 pg (ref 26.0–34.0)
MCHC: 32.3 g/dL (ref 30.0–36.0)
MCV: 82.5 fL (ref 80.0–100.0)
Monocytes Absolute: 0.3 10*3/uL (ref 0.1–1.0)
Monocytes Relative: 11 %
Neutro Abs: 2.2 10*3/uL (ref 1.7–7.7)
Neutrophils Relative %: 72 %
Platelets: 112 10*3/uL — ABNORMAL LOW (ref 150–400)
RBC: 3.08 MIL/uL — ABNORMAL LOW (ref 4.22–5.81)
RDW: 14.7 % (ref 11.5–15.5)
WBC: 3 10*3/uL — ABNORMAL LOW (ref 4.0–10.5)
nRBC: 0 % (ref 0.0–0.2)

## 2020-05-15 LAB — BASIC METABOLIC PANEL
Anion gap: 8 (ref 5–15)
BUN: 9 mg/dL (ref 6–20)
CO2: 23 mmol/L (ref 22–32)
Calcium: 7.7 mg/dL — ABNORMAL LOW (ref 8.9–10.3)
Chloride: 102 mmol/L (ref 98–111)
Creatinine, Ser: 0.84 mg/dL (ref 0.61–1.24)
GFR, Estimated: >60 mL/min (ref >60)
Glucose, Bld: 104 mg/dL — ABNORMAL HIGH (ref 70–99)
Potassium: 3.3 mmol/L — ABNORMAL LOW (ref 3.5–5.1)
Sodium: 133 mmol/L — ABNORMAL LOW (ref 135–145)

## 2020-05-15 LAB — MAGNESIUM: Magnesium: 2 mg/dL (ref 1.7–2.4)

## 2020-05-15 MED ORDER — POTASSIUM CHLORIDE CRYS ER 20 MEQ PO TBCR
40.0000 meq | EXTENDED_RELEASE_TABLET | ORAL | Status: AC
  Administered 2020-05-15 (×2): 40 meq via ORAL
  Filled 2020-05-15 (×2): qty 2

## 2020-05-15 MED ORDER — VANCOMYCIN 50 MG/ML ORAL SOLUTION
125.0000 mg | Freq: Four times a day (QID) | ORAL | Status: DC
  Administered 2020-05-15: 125 mg via ORAL
  Filled 2020-05-15 (×2): qty 2.5

## 2020-05-15 MED ORDER — FIDAXOMICIN 200 MG PO TABS
200.0000 mg | ORAL_TABLET | Freq: Two times a day (BID) | ORAL | Status: DC
  Administered 2020-05-15 – 2020-05-20 (×11): 200 mg via ORAL
  Filled 2020-05-15 (×11): qty 1

## 2020-05-15 MED ORDER — VANCOMYCIN 50 MG/ML ORAL SOLUTION: 125.0000 mg | Freq: Four times a day (QID) | ORAL | Status: DC

## 2020-05-15 MED ORDER — DILTIAZEM HCL ER COATED BEADS 240 MG PO CP24
240.0000 mg | ORAL_CAPSULE | Freq: Every day | ORAL | Status: DC
  Administered 2020-05-15 – 2020-05-16 (×2): 240 mg via ORAL
  Filled 2020-05-15 (×2): qty 1

## 2020-05-15 MED ORDER — ENOXAPARIN SODIUM 100 MG/ML ~~LOC~~ SOLN
100.0000 mg | Freq: Two times a day (BID) | SUBCUTANEOUS | Status: DC
  Administered 2020-05-15 – 2020-05-16 (×3): 100 mg via SUBCUTANEOUS
  Filled 2020-05-15 (×3): qty 1

## 2020-05-15 MED ORDER — CEPHALEXIN 500 MG PO CAPS
500.0000 mg | ORAL_CAPSULE | Freq: Four times a day (QID) | ORAL | Status: AC
  Administered 2020-05-15 – 2020-05-18 (×14): 500 mg via ORAL
  Filled 2020-05-15 (×14): qty 1

## 2020-05-15 NOTE — CV Procedure (Signed)
RUE venous duplex completed.  Results can be found under chart review under CV PROC. 05/15/2020 3:53 PM Talissa Apple RVT, RDMS

## 2020-05-15 NOTE — TOC Benefit Eligibility Note (Signed)
Patient Teacher, English as a foreign language completed.    The patient is currently admitted and upon discharge could be taking Vancomycin 125 mg.  The current 10 day co-pay is, $0.00.    The patient is currently admitted and upon discharge could be taking Dificid 200 mg.  The current 10 day co-pay is, $0.00.   The patient is insured through [place] Patient Advocate Specialist [place] Team Direct Number: [phone]  Fax: [phone]

## 2020-05-15 NOTE — Progress Notes (Signed)
[facility] PROGRESS NOTE   John [name] WCH:[id number] DOB: July 22, 1960 DOA: 05/12/2020  PCP: [name], MD  Brief History/Interval Summary: 60 y.o. male with PMH significant for squamous cell carcinoma of lung, rheumatoid arthritis, GERD.  Presented to the emergency department with complaints of generalized weakness.  He was started on systemic chemotherapy recently.  Patient was noted to be hyponatremic and neutropenic.  Had elevated D-dimer.  He was noted to be tachycardic.  Noted to have rapid atrial fibrillation.  He was hospitalized for further management.  Consultants: Medical oncology is following.  Infectious disease.  Cardiology  Procedures:    Transthoracic echocardiogram IMPRESSIONS  1. Left ventricular ejection fraction, by estimation, is 60 to 65%. The left ventricle has normal function. The left ventricle has no regional wall motion abnormalities. Left ventricular diastolic parameters are indeterminate.  2. Right ventricular systolic function is normal. The right ventricular size is normal. Tricuspid regurgitation signal is inadequate for assessing PA pressure.  3. The mitral valve is normal in structure. Trivial mitral valve regurgitation. No evidence of mitral stenosis.  4. The aortic valve is tricuspid. Aortic valve regurgitation is not visualized. No aortic stenosis is present.  5. The inferior vena cava is normal in size with greater than 50% respiratory variability, suggesting right atrial pressure of 3 mmHg.  6. The patient is in atrial fibrillation.  Antibiotics: Anti-infectives (From admission, onward)   Start     Dose/Rate Route Frequency Ordered Stop   05/15/20 1000  vancomycin (VANCOCIN) 50 mg/mL oral solution 125 mg  Status:  Discontinued        125 mg Oral 4 times daily 05/15/20 0644 05/15/20 0645   05/15/20 0800  vancomycin (VANCOCIN) 50 mg/mL oral solution 125 mg        125 mg Oral 4 times daily 05/15/20 0645 05/25/20 0959   05/13/20 1000   cefTRIAXone (ROCEPHIN) 2 g in sodium chloride 0.9 % 100 mL IVPB        2 g 200 mL/hr over 30 Minutes Intravenous Every 24 hours 05/13/20 0428     05/12/20 1800  piperacillin-tazobactam (ZOSYN) IVPB 3.375 g  Status:  Discontinued        3.375 g 12.5 mL/hr over 240 Minutes Intravenous Every 8 hours 05/12/20 0909 05/13/20 0428   05/12/20 0945  piperacillin-tazobactam (ZOSYN) IVPB 3.375 g        3.375 g 100 mL/hr over 30 Minutes Intravenous  Once 05/12/20 0858 05/13/20 3536      Subjective/Interval History: Patient continues to have diarrhea.  However he denies any abdominal pain this morning.  No nausea.  He is concerned about the swelling in his right arm.       Assessment/Plan:  Acute C. difficile diarrhea/severe sepsis/febrile neutropenia Patient symptoms started about a week after he received chemotherapy.  He was noted to be neutropenic.  He presented with profuse diarrhea.  GI pathogen panel was unremarkable but C. difficile testing is positive.  Probably due to infection.  Will be started on oral vancomycin. Sepsis physiology has improved.  Neutrophil count is improving.    E. coli bacteremia Infectious diseases following.  Blood cultures were repeated on 3/29 and negative so far.  ID has recommended 7-day course.  Could consider transition to oral antibacterials.  Sensitivities reviewed.  Keflex will be ordered.    Atrial fibrillation with RVR Patient was requiring Cardizem infusion.  Heart rate seems to be reasonably well controlled.  He was changed over to oral Cardizem yesterday.  Echocardiogram  showed normal systolic function.  TSH was normal.  Blood pressure stable.  Anticoagulation was recommended by cardiology.  This was discussed with Dr. [name] with medical oncology who does not have any opposition to same.  Platelet counts have improved.  Hemoglobin is stable.  No overt bleeding.  For now we will place him on full dose Lovenox and then transition to Eliquis in the next 24  to 48 hours.    Right arm swelling Most likely due to IV infiltration.  He has good radial pulses.  We will do a Doppler study.  Right lower lobe pulmonary artery filling defect CT angiogram of the chest showed a intraluminal nonocclusive filling defect which could be secondary to direct invasion of the vessel or an eccentric mural thrombus related to mass.  No primary pulmonary thromboembolism was noted.  Patient's medical oncologist is aware.  Stage IV squamous Cell carcinoma of right lung Diagnosed earlier this year.  Followed by Dr. [name].  Status post chemotherapy last week.  Normocytic anemia and thrombocytopenia Most likely secondary to chemotherapeutic effect.  Counts are stable.  Platelet counts have improved.    Odynophagia Probably related to radiation.  No thrush was noted on exam.  Mouthwash.  History of rheumatoid arthritis On leflunomide as well as Simponi  Hyponatremia secondary to hypovolemia Sodium counts have improved.  Hypokalemia Most likely due to diarrhea.  Continue to supplement potassium.  GERD Continue PPI.  History of depression Continue Paxil.  Obesity Estimated body mass index is 32.18 kg/m as calculated from the following:   Height as of this encounter: 5' 11.5" (1.816 m).   Weight as of this encounter: 106.1 kg.    DVT Prophylaxis: Noted to be on Lovenox Code Status: Full code Family Communication: Discussed with the patient Disposition Plan: Hopefully return home when improved  Status is: Inpatient  Remains inpatient appropriate because:IV treatments appropriate due to intensity of illness or inability to take PO and Inpatient level of care appropriate due to severity of illness   Dispo: The patient is from: Home              Anticipated d/c is to: Home              Patient currently is not medically stable to d/c.   Difficult to place patient No       Medications:  Scheduled: . diltiazem  60 mg Oral Q6H  . magic  mouthwash w/lidocaine  5 mL Oral TID AC & HS  . mouth rinse  15 mL Mouth Rinse BID  . PARoxetine  10 mg Oral Daily  . potassium chloride  40 mEq Oral Q4H  . vancomycin  125 mg Oral QID   Continuous: . sodium chloride 75 mL/hr at 05/14/20 2306  . cefTRIAXone (ROCEPHIN)  IV 2 g (05/15/20 0918)   RDE:YCXKGYJEHUDJS, loperamide, ondansetron (ZOFRAN) IV   Objective:  Vital Signs  Vitals:   05/15/20 0030 05/15/20 0036 05/15/20 0520 05/15/20 0554  BP: (!) 152/84 137/75 (!) 105/56 108/69  Pulse: (!) 102 86 82 88  Resp:   16   Temp:   98.2 F (36.8 C)   TempSrc:   Oral   SpO2:   95%   Weight:      Height:        Intake/Output Summary (Last 24 hours) at 05/15/2020 0923 Last data filed at 05/15/2020 0600 Gross per 24 hour  Intake 2378.91 ml  Output --  Net 2378.91 ml   [employer]  05/12/20 0231  Weight: 106.1 kg    General appearance: Awake alert.  In no distress Resp: Clear to auscultation bilaterally.  Normal effort Cardio: S1-S2 is normal regular.  No S3-S4.  No rubs murmurs or bruit GI: Abdomen is soft.  Nontender nondistended.  Bowel sounds are present normal.  No masses organomegaly Extremities: Right arm is noted to be swollen with good radial pulses. Neurologic: Alert and oriented x3.  No focal neurological deficits.     Lab Results:  Data Reviewed: I have personally reviewed following labs and imaging studies  CBC: Recent Labs  Lab 05/12/20 0244 05/13/20 0410 05/14/20 0424 05/15/20 0342  WBC 0.3* 0.5* 1.8* 3.0*  NEUTROABS 0.0*  --  1.2* 2.2  HGB 10.8* 9.0* 8.0* 8.2*  HCT 32.8* 27.0* 24.4* 25.4*  MCV 79.8* 81.3 82.2 82.5  PLT 117* 89* 90* 112*    Basic Metabolic Panel: Recent Labs  Lab 05/12/20 0244 05/12/20 1247 05/13/20 0410 05/14/20 0424 05/15/20 0342  NA 126* 130* 132* 133* 133*  K 3.8 3.6 3.5 3.2* 3.3*  CL 94* 96* 97* 100 102  CO2 21* 25 23 21* 23  GLUCOSE 135* 133* 109* 96 104*  BUN 21* 17 14 13 9   CREATININE 1.16 0.92 1.05 1.01  0.84  CALCIUM 8.2* 7.4* 7.7* 7.6* 7.7*  MG  --   --   --  1.9 2.0  PHOS  --   --   --  2.2*  --     GFR: Estimated Creatinine Clearance: 119.7 mL/min (by C-G formula based on SCr of 0.84 mg/dL).  Liver Function Tests: Recent Labs  Lab 05/12/20 0254  AST 17  ALT 14  ALKPHOS 82  BILITOT 2.8*  PROT 6.7  ALBUMIN 3.2*     Coagulation Profile: Recent Labs  Lab 05/12/20 0508  INR 1.1      Recent Results (from the past 240 hour(s))  SARS CORONAVIRUS 2 (TAT 6-24 HRS) Nasopharyngeal Nasopharyngeal Swab     Status: None   Collection Time: 05/12/20  5:09 AM   Specimen: Nasopharyngeal Swab  Result Value Ref Range Status   SARS Coronavirus 2 NEGATIVE NEGATIVE Final    Comment: (NOTE) SARS-CoV-2 target nucleic acids are NOT DETECTED.  The SARS-CoV-2 RNA is generally detectable in upper and lower respiratory specimens during the acute phase of infection. Negative results do not preclude SARS-CoV-2 infection, do not rule out co-infections with other pathogens, and should not be used as the sole basis for treatment or other patient management decisions. Negative results must be combined with clinical observations, patient history, and epidemiological information. The expected result is Negative.  Fact Sheet for Patients: SugarRoll.be  Fact Sheet for Healthcare Providers: https://www.woods-mathews.com/  This test is not yet approved or cleared by the [place] FDA and  has been authorized for detection and/or diagnosis of SARS-CoV-2 by FDA under an Emergency Use Authorization (EUA). This EUA will remain  in effect (meaning this test can be used) for the duration of the COVID-19 declaration under Se ction 564(b)(1) of the Act, 21 U.S.C. section 360bbb-3(b)(1), unless the authorization is terminated or revoked sooner.  Performed at [facility], [place] [address], [place], [place] [address]   Culture, blood (x 2)     Status:  None (Preliminary result)   Collection Time: 05/12/20  9:43 AM   Specimen: BLOOD RIGHT HAND  Result Value Ref Range Status   Specimen Description   Final    BLOOD RIGHT HAND Performed at [facility], [place] [address], [place], [place] [address]    Special Requests   Final    BOTTLES DRAWN AEROBIC ONLY Blood Culture adequate volume Performed at [facility] [address], [place], [place] [address]    Culture   Final    NO GROWTH 3 DAYS Performed at [facility], [place] [address], [place], [place] [address]    Report Status PENDING  Incomplete  Culture, blood (x 2)     Status: Abnormal   Collection Time: 05/12/20  9:43 AM   Specimen: Right Antecubital; Blood  Result Value Ref Range Status   Specimen Description   Final    RIGHT ANTECUBITAL Performed at [place] [address], [place], [place] [address]    Special Requests   Final    BOTTLES DRAWN AEROBIC AND ANAEROBIC Blood Culture adequate volume Performed at [place] [address] [place], [place] [address]    Culture  Setup Time   Final    IN BOTH AEROBIC AND ANAEROBIC BOTTLES GRAM NEGATIVE RODS Organism ID to follow CRITICAL RESULT CALLED TO, READ BACK BY AND VERIFIED WITH: L POINDEXTER PHARMD 05/13/20 0405 JDW Performed at [facility], [place] [address] [place], [place] [address]    Culture ESCHERICHIA COLI (A)  Final   Report Status 05/15/2020 FINAL  Final   Organism ID, Bacteria ESCHERICHIA COLI  Final      Susceptibility   Escherichia coli - MIC*    AMPICILLIN >=32 RESISTANT Resistant     CEFAZOLIN <=4 SENSITIVE Sensitive     CEFEPIME <=0.12 SENSITIVE Sensitive     CEFTAZIDIME <=1 SENSITIVE Sensitive     CEFTRIAXONE <=0.25 SENSITIVE Sensitive     CIPROFLOXACIN <=0.25 SENSITIVE Sensitive     GENTAMICIN <=1 SENSITIVE Sensitive     IMIPENEM <=0.25 SENSITIVE Sensitive     TRIMETH/SULFA <=20 SENSITIVE Sensitive      AMPICILLIN/SULBACTAM >=32 RESISTANT Resistant     PIP/TAZO <=4 SENSITIVE Sensitive     * ESCHERICHIA COLI  Blood Culture ID Panel (Reflexed)     Status: Abnormal   Collection Time: 05/12/20  9:43 AM  Result Value Ref Range Status   Enterococcus faecalis NOT DETECTED NOT DETECTED Final   Enterococcus Faecium NOT DETECTED NOT DETECTED Final   Listeria monocytogenes NOT DETECTED NOT DETECTED Final   Staphylococcus species NOT DETECTED NOT DETECTED Final   Staphylococcus aureus (BCID) NOT DETECTED NOT DETECTED Final   Staphylococcus epidermidis NOT DETECTED NOT DETECTED Final   Staphylococcus lugdunensis NOT DETECTED NOT DETECTED Final   Streptococcus species NOT DETECTED NOT DETECTED Final   Streptococcus agalactiae NOT DETECTED NOT DETECTED Final   Streptococcus pneumoniae NOT DETECTED NOT DETECTED Final   Streptococcus pyogenes NOT DETECTED NOT DETECTED Final   A.calcoaceticus-baumannii NOT DETECTED NOT DETECTED Final   Bacteroides fragilis NOT DETECTED NOT DETECTED Final   Enterobacterales DETECTED (A) NOT DETECTED Final    Comment: Enterobacterales represent a large order of gram negative bacteria, not a single organism. CRITICAL RESULT CALLED TO, READ BACK BY AND VERIFIED WITH: L POINDEXTER PHAMRD 05/13/20 0405 JDW    Enterobacter cloacae complex NOT DETECTED NOT DETECTED Final   Escherichia coli DETECTED (A) NOT DETECTED Final    Comment: CRITICAL RESULT CALLED TO, READ BACK BY AND VERIFIED WITH: L POINDEXTER PHARMD 05/13/20 0405 JDW    Klebsiella aerogenes NOT DETECTED NOT DETECTED Final   Klebsiella oxytoca NOT DETECTED NOT DETECTED Final   Klebsiella pneumoniae  NOT DETECTED NOT DETECTED Final   Proteus species NOT DETECTED NOT DETECTED Final   Salmonella species NOT DETECTED NOT DETECTED Final   Serratia marcescens NOT DETECTED NOT DETECTED Final   Haemophilus influenzae NOT DETECTED NOT DETECTED Final   Neisseria meningitidis NOT DETECTED NOT DETECTED Final   Pseudomonas  aeruginosa NOT DETECTED NOT DETECTED Final   Stenotrophomonas maltophilia NOT DETECTED NOT DETECTED Final   Candida albicans NOT DETECTED NOT DETECTED Final   Candida auris NOT DETECTED NOT DETECTED Final   Candida glabrata NOT DETECTED NOT DETECTED Final   Candida krusei NOT DETECTED NOT DETECTED Final   Candida parapsilosis NOT DETECTED NOT DETECTED Final   Candida tropicalis NOT DETECTED NOT DETECTED Final   Cryptococcus neoformans/gattii NOT DETECTED NOT DETECTED Final   CTX-M ESBL NOT DETECTED NOT DETECTED Final   Carbapenem resistance IMP NOT DETECTED NOT DETECTED Final   Carbapenem resistance KPC NOT DETECTED NOT DETECTED Final   Carbapenem resistance NDM NOT DETECTED NOT DETECTED Final   Carbapenem resist OXA 48 LIKE NOT DETECTED NOT DETECTED Final   Carbapenem resistance VIM NOT DETECTED NOT DETECTED Final    Comment: Performed at [facility], [address] [place], [place] [address]  Gastrointestinal Panel by PCR , Stool     Status: None   Collection Time: 05/13/20 12:48 PM   Specimen: Stool  Result Value Ref Range Status   Campylobacter species NOT DETECTED NOT DETECTED Final   Plesimonas shigelloides NOT DETECTED NOT DETECTED Final   Salmonella species NOT DETECTED NOT DETECTED Final   Yersinia enterocolitica NOT DETECTED NOT DETECTED Final   Vibrio species NOT DETECTED NOT DETECTED Final   Vibrio cholerae NOT DETECTED NOT DETECTED Final   Enteroaggregative E coli (EAEC) NOT DETECTED NOT DETECTED Final   Enteropathogenic E coli (EPEC) NOT DETECTED NOT DETECTED Final   Enterotoxigenic E coli (ETEC) NOT DETECTED NOT DETECTED Final   Shiga like toxin producing E coli (STEC) NOT DETECTED NOT DETECTED Final   Shigella/Enteroinvasive E coli (EIEC) NOT DETECTED NOT DETECTED Final   Cryptosporidium NOT DETECTED NOT DETECTED Final   Cyclospora cayetanensis NOT DETECTED NOT DETECTED Final   Entamoeba histolytica NOT DETECTED NOT DETECTED Final   Giardia lamblia NOT  DETECTED NOT DETECTED Final   Adenovirus F40/41 NOT DETECTED NOT DETECTED Final   Astrovirus NOT DETECTED NOT DETECTED Final   Norovirus GI/GII NOT DETECTED NOT DETECTED Final   Rotavirus A NOT DETECTED NOT DETECTED Final   Sapovirus (I, II, IV, and V) NOT DETECTED NOT DETECTED Final    Comment: Performed at [facility], [place] [place], [place] [address]  Culture, blood (routine x 2)     Status: None (Preliminary result)   Collection Time: 05/13/20  2:53 PM   Specimen: BLOOD  Result Value Ref Range Status   Specimen Description   Final    BLOOD RIGHT ANTECUBITAL Performed at [facility], [place] [address] [place], [place] [address]    Special Requests   Final    BOTTLES DRAWN AEROBIC AND ANAEROBIC Blood Culture adequate volume Performed at [place] [address], [place], [place] [address]    Culture   Final    NO GROWTH 2 DAYS Performed at [facility] [address], [place], [place] [address]    Report Status PENDING  Incomplete  Culture, blood (routine x 2)     Status: None (Preliminary result)   Collection Time: 05/13/20  2:53 PM  Specimen: BLOOD RIGHT HAND  Result Value Ref Range Status   Specimen Description   Final    BLOOD RIGHT HAND Performed at [place] [address] [place], [place] [address]    Special Requests   Final    BOTTLES DRAWN AEROBIC AND ANAEROBIC Blood Culture adequate volume Performed at [facility] [place] [address], [place], [place] [address]    Culture   Final    NO GROWTH 2 DAYS Performed at [place] [address] [place], [place] [address]    Report Status PENDING  Incomplete  C Difficile Quick Screen w PCR reflex     Status: Abnormal   Collection Time: 05/14/20  4:20 PM   Specimen: STOOL  Result Value Ref Range Status   C Diff antigen POSITIVE (A) NEGATIVE Final   C Diff toxin POSITIVE (A) NEGATIVE Final   C Diff  interpretation Toxin producing C. difficile detected.  Final    Comment: CRITICAL RESULT CALLED TO, READ BACK BY AND VERIFIED WITH: GREGG,L. RN @1840  ON 03.30.2022 BY COHEN,K Performed at [facility], [place] [address], [place], [place] [address]       Radiology Studies: No results found.     LOS: 3 days   Anne-Marie Genson [employer] on [email or web address]  05/15/2020, 9:23 AM

## 2020-05-15 NOTE — Progress Notes (Signed)
PHARMACY NOTE -  Lovenox  Pharmacy has been assisting with dosing of enoxaparin for Afib.  Dosage remains stable at 100 mg SQ BID and will likely transition to [place] in next 24-48 hr  Pharmacy will sign off, following peripherally for dose adjustments. Please reconsult if a change in clinical status warrants re-evaluation of dosage.  Reuel Boom, PharmD, BCPS [phone] 05/15/2020, 9:43 AM

## 2020-05-15 NOTE — Progress Notes (Signed)
Physical Therapy Treatment Patient Details Name: John [name] MRN: [id number] DOB: [birth year] Today's Date: 05/15/2020    History of Present Illness 60 yo male admitted with Afib with RVR, weakness, neutropenia. Hx of stage IV lung ca    PT Comments    Pt reports no dizziness today and able to progress to ambulating short distance in hallway.  Pt fatigued quickly.  Pt agreeable to ambulate with nursing staff to improve endurance and prepare for eventual d/c home.    Follow Up Recommendations  No PT follow up;Supervision for mobility/OOB     Equipment Recommendations  None recommended by PT    Recommendations for Other Services       Precautions / Restrictions Precautions Precautions: Fall Precaution Comments: monitor HR    Mobility  Bed Mobility Overal bed mobility: Modified Independent                  Transfers Overall transfer level: Needs assistance Equipment used: None Transfers: Sit to/from Stand Sit to Stand: Supervision         General transfer comment: no dizziness reported today  Ambulation/Gait Ambulation/Gait assistance: Min guard;Supervision Gait Distance (Feet): 160 Feet Assistive device: None;IV Pole Gait Pattern/deviations: Step-through pattern;Decreased stride length     General Gait Details: pt pushed IV pole end of ambulation for a little more support; no unsteadiness or LOB observed, pt denies dizziness; HR up to 135 bpm   Stairs             Wheelchair Mobility    Modified Rankin (Stroke Patients Only)       Balance                                            Cognition Arousal/Alertness: Awake/alert Behavior During Therapy: WFL for tasks assessed/performed Overall Cognitive Status: Within Functional Limits for tasks assessed                                        Exercises      General Comments        Pertinent Vitals/Pain Pain Assessment: No/denies pain    Home  Living                      Prior Function            PT Goals (current goals can now be found in the care plan section) Progress towards PT goals: Progressing toward goals    Frequency    Min 3X/week      PT Plan Current plan remains appropriate    Co-evaluation              AM-PAC PT "6 Clicks" Mobility   Outcome Measure  Help needed turning from your back to your side while in a flat bed without using bedrails?: None Help needed moving from lying on your back to sitting on the side of a flat bed without using bedrails?: None Help needed moving to and from a bed to a chair (including a wheelchair)?: None Help needed standing up from a chair using your arms (e.g., wheelchair or bedside chair)?: None Help needed to walk in hospital room?: A Little Help needed climbing 3-5 steps with a railing? : A Little 6 Click Score: 22  End of Session   Activity Tolerance: Patient tolerated treatment well Patient left: in chair;with call bell/phone within reach (pt reports up ad lib in room to [facility])   PT Visit Diagnosis: Muscle weakness (generalized) (M62.81);Difficulty in walking, not elsewhere classified (R26.2)     Time: 4356-8616 PT Time Calculation (min) (ACUTE ONLY): 14 min  Charges:  $Gait Training: 8-22 mins                    Jannette Spanner PT, DPT Acute Rehabilitation Services Pager: [phone] Office: [phone]   Trena Platt 05/15/2020, 3:38 PM

## 2020-05-15 NOTE — Progress Notes (Signed)
[facility] for Infectious Disease  Date of Admission:  05/12/2020     CC: Neutropenic fever cdiff  Lines: peripheral  Abx: 3/31-c cephalexin 3/31-c dificid  3/28-31 ceftriaxone  ASSESSMENT: #neutropenic fever #ecoli bacteremia Source thought to be mucositis related due to chemo for his lung cancer Repeat blood culture is negative, quick defervescence  -transition to oral antibiotics and finish 7 day course total -clinically improving  #cdiff diarrhea Diarrhea since starting chemo. Mild. cdiff testing positive for toxin  -finish 10 day difficid   ID will sign off  1.   Principal Problem:   Atrial fibrillation with RVR (HCC) Active Problems:   Stage 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 (HCC)   Hyponatremia   Neutropenic fever (HCC)   Occlusion of right pulmonary artery (HCC)   Neutropenia (HCC)   Thrombus   Diarrhea   Orthostatic hypotension   No Known Allergies  Scheduled Meds: . cephALEXin  500 mg Oral Q6H  . diltiazem  240 mg Oral Daily  . enoxaparin (LOVENOX) injection  100 mg Subcutaneous BID  . fidaxomicin  200 mg Oral BID  . magic mouthwash w/lidocaine  5 mL Oral TID AC & HS  . mouth rinse  15 mL Mouth Rinse BID  . PARoxetine  10 mg Oral Daily   Continuous Infusions: . sodium chloride 75 mL/hr at 05/14/20 2306   PRN Meds:.acetaminophen, loperamide, ondansetron (ZOFRAN) IV   SUBJECTIVE: Patient feeling better This lunch ate all Still with watery diarrhea 3-5 times a day No abd pain, n/v Repeat blood culture is negative His wbc count is improving Remains afebrile since initially  cdiff testing positive last pm and was started on difficid  Review of Systems: ROS All other ROS was negative, except mentioned above     OBJECTIVE: Vitals:   05/15/20 0036 05/15/20 0520 05/15/20 0554 05/15/20 1205  BP: 137/75 (!) 105/56 108/69 117/85  Pulse: 86 82 88 91  Resp:  16  18  Temp:  98.2 F (36.8 C)  97.7 F (36.5  C)  TempSrc:  Oral  Oral  SpO2:  95%  95%  Weight:      Height:       Body mass index is 32.18 kg/m.  Physical Exam General/constitutional: no distress, pleasant HEENT: Normocephalic, PER, Conj Clear, EOMI, Oropharynx clear Neck supple CV: rrr no mrg Lungs: clear to auscultation, normal respiratory effort Abd: Soft, Nontender Ext: no edema Skin: No Rash Neuro: nonfocal MSK: no peripheral joint swelling/tenderness/warmth; back spines nontender   Central line presence: no   Lab Results Lab Results  Component Value Date   WBC 3.0 (L) 05/15/2020   HGB 8.2 (L) 05/15/2020   HCT 25.4 (L) 05/15/2020   MCV 82.5 05/15/2020   PLT 112 (L) 05/15/2020    Lab Results  Component Value Date   CREATININE 0.84 05/15/2020   BUN 9 05/15/2020   NA 133 (L) 05/15/2020   K 3.3 (L) 05/15/2020   CL 102 05/15/2020   CO2 23 05/15/2020    Lab Results  Component Value Date   ALT 14 05/12/2020   AST 17 05/12/2020   ALKPHOS 82 05/12/2020   BILITOT 2.8 (H) 05/12/2020      Microbiology: Recent Results (from the past 240 hour(s))  SARS CORONAVIRUS 2 (TAT 6-24 HRS) Nasopharyngeal Nasopharyngeal Swab     Status: None   Collection Time: 05/12/20  5:09 AM   Specimen: Nasopharyngeal Swab  Result Value Ref Range Status  SARS Coronavirus 2 NEGATIVE NEGATIVE Final    Comment: (NOTE) SARS-CoV-2 target nucleic acids are NOT DETECTED.  The SARS-CoV-2 RNA is generally detectable in upper and lower respiratory specimens during the acute phase of infection. Negative results do not preclude SARS-CoV-2 infection, do not rule out co-infections with other pathogens, and should not be used as the sole basis for treatment or other patient management decisions. Negative results must be combined with clinical observations, patient history, and epidemiological information. The expected result is Negative.  Fact Sheet for Patients: SugarRoll.be  Fact Sheet for Healthcare  Providers: https://www.woods-mathews.com/  This test is not yet approved or cleared by the [place] FDA and  has been authorized for detection and/or diagnosis of SARS-CoV-2 by FDA under an Emergency Use Authorization (EUA). This EUA will remain  in effect (meaning this test can be used) for the duration of the COVID-19 declaration under Se ction 564(b)(1) of the Act, 21 U.S.C. section 360bbb-3(b)(1), unless the authorization is terminated or revoked sooner.  Performed at [facility], [place] [address], [place], [place] [address]   Culture, blood (x 2)     Status: None (Preliminary result)   Collection Time: 05/12/20  9:43 AM   Specimen: BLOOD RIGHT HAND  Result Value Ref Range Status   Specimen Description   Final    BLOOD RIGHT HAND Performed at [facility] [address], [place], [place] [address]    Special Requests   Final    BOTTLES DRAWN AEROBIC ONLY Blood Culture adequate volume Performed at [facility] [address] [place], [place] [address]    Culture   Final    NO GROWTH 3 DAYS Performed at [facility], [place] [address] [place], [place] [address]    Report Status PENDING  Incomplete  Culture, blood (x 2)     Status: Abnormal   Collection Time: 05/12/20  9:43 AM   Specimen: Right Antecubital; Blood  Result Value Ref Range Status   Specimen Description   Final    RIGHT ANTECUBITAL Performed at [facility] [address] [place], [place] [address]    Special Requests   Final    BOTTLES DRAWN AEROBIC AND ANAEROBIC Blood Culture adequate volume Performed at [facility] [address], [place], [place] [address]    Culture  Setup Time   Final    IN BOTH AEROBIC AND ANAEROBIC BOTTLES GRAM NEGATIVE RODS Organism ID to follow CRITICAL RESULT CALLED TO, READ BACK BY AND VERIFIED WITH: L POINDEXTER PHARMD 05/13/20 0405 JDW Performed at [facility], [place] [address], [place], [place] [address]    Culture ESCHERICHIA COLI (A)  Final   Report Status 05/15/2020 FINAL  Final   Organism ID, Bacteria ESCHERICHIA COLI  Final      Susceptibility   Escherichia coli - MIC*    AMPICILLIN >=32 RESISTANT Resistant     CEFAZOLIN <=4 SENSITIVE Sensitive     CEFEPIME <=0.12 SENSITIVE Sensitive     CEFTAZIDIME <=1 SENSITIVE Sensitive     CEFTRIAXONE <=0.25 SENSITIVE Sensitive     CIPROFLOXACIN <=0.25 SENSITIVE Sensitive     GENTAMICIN <=1 SENSITIVE Sensitive     IMIPENEM <=0.25 SENSITIVE Sensitive     TRIMETH/SULFA <=20 SENSITIVE Sensitive     AMPICILLIN/SULBACTAM >=32 RESISTANT Resistant     PIP/TAZO <=4 SENSITIVE Sensitive     * ESCHERICHIA COLI  Blood Culture ID Panel (Reflexed)  Status: Abnormal   Collection Time: 05/12/20  9:43 AM  Result Value Ref Range Status   Enterococcus faecalis NOT DETECTED NOT DETECTED Final   Enterococcus Faecium NOT DETECTED NOT DETECTED Final   Listeria monocytogenes NOT DETECTED NOT DETECTED Final   Staphylococcus species NOT DETECTED NOT DETECTED Final   Staphylococcus aureus (BCID) NOT DETECTED NOT DETECTED Final   Staphylococcus epidermidis NOT DETECTED NOT DETECTED Final   Staphylococcus lugdunensis NOT DETECTED NOT DETECTED Final   Streptococcus species NOT DETECTED NOT DETECTED Final   Streptococcus agalactiae NOT DETECTED NOT DETECTED Final   Streptococcus pneumoniae NOT DETECTED NOT DETECTED Final   Streptococcus pyogenes NOT DETECTED NOT DETECTED Final   A.calcoaceticus-baumannii NOT DETECTED NOT DETECTED Final   Bacteroides fragilis NOT DETECTED NOT DETECTED Final   Enterobacterales DETECTED (A) NOT DETECTED Final    Comment: Enterobacterales represent a large order of gram negative bacteria, not a single organism. CRITICAL RESULT CALLED TO, READ BACK BY AND VERIFIED WITH: L POINDEXTER PHAMRD 05/13/20 0405 JDW    Enterobacter cloacae complex NOT DETECTED NOT DETECTED Final   Escherichia coli  DETECTED (A) NOT DETECTED Final    Comment: CRITICAL RESULT CALLED TO, READ BACK BY AND VERIFIED WITH: L POINDEXTER PHARMD 05/13/20 0405 JDW    Klebsiella aerogenes NOT DETECTED NOT DETECTED Final   Klebsiella oxytoca NOT DETECTED NOT DETECTED Final   Klebsiella pneumoniae NOT DETECTED NOT DETECTED Final   Proteus species NOT DETECTED NOT DETECTED Final   Salmonella species NOT DETECTED NOT DETECTED Final   Serratia marcescens NOT DETECTED NOT DETECTED Final   Haemophilus influenzae NOT DETECTED NOT DETECTED Final   Neisseria meningitidis NOT DETECTED NOT DETECTED Final   Pseudomonas aeruginosa NOT DETECTED NOT DETECTED Final   Stenotrophomonas maltophilia NOT DETECTED NOT DETECTED Final   Candida albicans NOT DETECTED NOT DETECTED Final   Candida auris NOT DETECTED NOT DETECTED Final   Candida glabrata NOT DETECTED NOT DETECTED Final   Candida krusei NOT DETECTED NOT DETECTED Final   Candida parapsilosis NOT DETECTED NOT DETECTED Final   Candida tropicalis NOT DETECTED NOT DETECTED Final   Cryptococcus neoformans/gattii NOT DETECTED NOT DETECTED Final   CTX-M ESBL NOT DETECTED NOT DETECTED Final   Carbapenem resistance IMP NOT DETECTED NOT DETECTED Final   Carbapenem resistance KPC NOT DETECTED NOT DETECTED Final   Carbapenem resistance NDM NOT DETECTED NOT DETECTED Final   Carbapenem resist OXA 48 LIKE NOT DETECTED NOT DETECTED Final   Carbapenem resistance VIM NOT DETECTED NOT DETECTED Final    Comment: Performed at [facility], [address], [place], [place] [address]  Gastrointestinal Panel by PCR , Stool     Status: None   Collection Time: 05/13/20 12:48 PM   Specimen: Stool  Result Value Ref Range Status   Campylobacter species NOT DETECTED NOT DETECTED Final   Plesimonas shigelloides NOT DETECTED NOT DETECTED Final   Salmonella species NOT DETECTED NOT DETECTED Final   Yersinia enterocolitica NOT DETECTED NOT DETECTED Final   Vibrio species NOT DETECTED NOT  DETECTED Final   Vibrio cholerae NOT DETECTED NOT DETECTED Final   Enteroaggregative E coli (EAEC) NOT DETECTED NOT DETECTED Final   Enteropathogenic E coli (EPEC) NOT DETECTED NOT DETECTED Final   Enterotoxigenic E coli (ETEC) NOT DETECTED NOT DETECTED Final   Shiga like toxin producing E coli (STEC) NOT DETECTED NOT DETECTED Final   Shigella/Enteroinvasive E coli (EIEC) NOT DETECTED NOT DETECTED Final   Cryptosporidium NOT DETECTED NOT DETECTED Final  Cyclospora cayetanensis NOT DETECTED NOT DETECTED Final   Entamoeba histolytica NOT DETECTED NOT DETECTED Final   Giardia lamblia NOT DETECTED NOT DETECTED Final   Adenovirus F40/41 NOT DETECTED NOT DETECTED Final   Astrovirus NOT DETECTED NOT DETECTED Final   Norovirus GI/GII NOT DETECTED NOT DETECTED Final   Rotavirus A NOT DETECTED NOT DETECTED Final   Sapovirus (I, II, IV, and V) NOT DETECTED NOT DETECTED Final    Comment: Performed at [facility], [place]., [place], [place] [address]  Culture, blood (routine x 2)     Status: None (Preliminary result)   Collection Time: 05/13/20  2:53 PM   Specimen: BLOOD  Result Value Ref Range Status   Specimen Description   Final    BLOOD RIGHT ANTECUBITAL Performed at [facility], [place] [address], [place], [place] [address]    Special Requests   Final    BOTTLES DRAWN AEROBIC AND ANAEROBIC Blood Culture adequate volume Performed at [facility] [address], [place], [place] [address]    Culture   Final    NO GROWTH 2 DAYS Performed at [facility] [address] [place], [place] [address]    Report Status PENDING  Incomplete  Culture, blood (routine x 2)     Status: None (Preliminary result)   Collection Time: 05/13/20  2:53 PM   Specimen: BLOOD RIGHT HAND  Result Value Ref Range Status   Specimen Description   Final    BLOOD RIGHT HAND Performed at [facility] [address], [place], [place]  [address]    Special Requests   Final    BOTTLES DRAWN AEROBIC AND ANAEROBIC Blood Culture adequate volume Performed at [facility] [address] [place], [place] [address]    Culture   Final    NO GROWTH 2 DAYS Performed at [facility] [address], [place], [place] [address]    Report Status PENDING  Incomplete  C Difficile Quick Screen w PCR reflex     Status: Abnormal   Collection Time: 05/14/20  4:20 PM   Specimen: STOOL  Result Value Ref Range Status   C Diff antigen POSITIVE (A) NEGATIVE Final   C Diff toxin POSITIVE (A) NEGATIVE Final   C Diff interpretation Toxin producing C. difficile detected.  Final    Comment: CRITICAL RESULT CALLED TO, READ BACK BY AND VERIFIED WITH: GREGG,L. RN @1840  ON 03.30.2022 BY COHEN,K Performed at [facility], [place] [address] [place],  [address]      Serology:   Imaging: If present, new imagings (plain films, ct scans, and mri) have been personally visualized and interpreted; radiology reports have been reviewed. Decision making incorporated into the Impression / Recommendations.   Jabier Mutton, [facility] for Infectious [place] [phone] pager    05/15/2020, 2:41 PM

## 2020-05-15 NOTE — Progress Notes (Signed)
Progress Note  Patient Name: John [name] Date of Encounter: 05/15/2020  [facility] Cardiologist: Fransico Him, MD   Subjective   Found to be C Diff + and still having diarrhea.  HR controlled in afib on PO Cardizem.  Now on Lovenox and plan to transition to Eliquis in 24-48 hours.   Inpatient Medications    Scheduled Meds: . cephALEXin  500 mg Oral Q6H  . diltiazem  60 mg Oral Q6H  . enoxaparin (LOVENOX) injection  100 mg Subcutaneous BID  . magic mouthwash w/lidocaine  5 mL Oral TID AC & HS  . mouth rinse  15 mL Mouth Rinse BID  . PARoxetine  10 mg Oral Daily  . potassium chloride  40 mEq Oral Q4H  . vancomycin  125 mg Oral QID   Continuous Infusions: . sodium chloride 75 mL/hr at 05/14/20 2306   PRN Meds: acetaminophen, loperamide, ondansetron (ZOFRAN) IV   Vital Signs    Vitals:   05/15/20 0030 05/15/20 0036 05/15/20 0520 05/15/20 0554  BP: (!) 152/84 137/75 (!) 105/56 108/69  Pulse: (!) 102 86 82 88  Resp:   16   Temp:   98.2 F (36.8 C)   TempSrc:   Oral   SpO2:   95%   Weight:      Height:        Intake/Output Summary (Last 24 hours) at 05/15/2020 1018 Last data filed at 05/15/2020 5732 Gross per 24 hour  Intake 2258.91 ml  Output --  Net 2258.91 ml   Last 3 Weights 05/12/2020 05/06/2020 04/28/2020  Weight (lbs) 234 lb 234 lb 11.2 oz 234 lb 11.2 oz  Weight (kg) 106.142 kg 106.459 kg 106.459 kg      Telemetry    Atrial fibrillation with CVR - Personally Reviewed  ECG    No new EKG to review - Personally Reviewed  Physical Exam   GEN: Well nourished, well developed in no acute distress HEENT: Normal NECK: No JVD; No carotid bruits LYMPHATICS: No lymphadenopathy CARDIAC:irregularly irregular, no murmurs, rubs, gallops RESPIRATORY:  Clear to auscultation without rales, wheezing or rhonchi  ABDOMEN: Soft, non-tender, non-distended MUSCULOSKELETAL:  No edema; No deformity  SKIN: Warm and dry NEUROLOGIC:  Alert and oriented x  3 PSYCHIATRIC:  Normal affect    Labs    High Sensitivity Troponin:   Recent Labs  Lab 05/12/20 0244 05/12/20 0444 05/12/20 1056 05/12/20 1247  TROPONINIHS 7 7 8 7       Chemistry Recent Labs  Lab 05/12/20 0254 05/12/20 1247 05/13/20 0410 05/14/20 0424 05/15/20 0342  NA  --    < > 132* 133* 133*  K  --    < > 3.5 3.2* 3.3*  CL  --    < > 97* 100 102  CO2  --    < > 23 21* 23  GLUCOSE  --    < > 109* 96 104*  BUN  --    < > 14 13 9   CREATININE  --    < > 1.05 1.01 0.84  CALCIUM  --    < > 7.7* 7.6* 7.7*  PROT 6.7  --   --   --   --   ALBUMIN 3.2*  --   --   --   --   AST 17  --   --   --   --   ALT 14  --   --   --   --   ALKPHOS 82  --   --   --   --  BILITOT 2.8*  --   --   --   --   GFRNONAA  --    < > >60 >60 >60  ANIONGAP  --    < > 12 12 8    < > = values in this interval not displayed.     Hematology Recent Labs  Lab 05/13/20 0410 05/14/20 0424 05/15/20 0342  WBC 0.5* 1.8* 3.0*  RBC 3.32* 2.97* 3.08*  HGB 9.0* 8.0* 8.2*  HCT 27.0* 24.4* 25.4*  MCV 81.3 82.2 82.5  MCH 27.1 26.9 26.6  MCHC 33.3 32.8 32.3  RDW 14.5 14.6 14.7  PLT 89* 90* 112*    BNPNo results for input(s): BNP, PROBNP in the last 168 hours.   DDimer  Recent Labs  Lab 05/12/20 0244  DDIMER 2.91*     CHA2DS2-VASc Score = 0  This indicates a 0.2% annual risk of stroke. The patient's score is based upon: CHF History: No HTN History: No Diabetes History: No Stroke History: No Vascular Disease History: No Age Score: 0 Gender Score: 0       Radiology    No results found.  Cardiac Studies   2D echo 05/12/2020 IMPRESSIONS   1. Left ventricular ejection fraction, by estimation, is 60 to 65%. The  left ventricle has normal function. The left ventricle has no regional  wall motion abnormalities. Left ventricular diastolic parameters are  indeterminate.  2. Right ventricular systolic function is normal. The right ventricular  size is normal. Tricuspid  regurgitation signal is inadequate for assessing  PA pressure.  3. The mitral valve is normal in structure. Trivial mitral valve  regurgitation. No evidence of mitral stenosis.  4. The aortic valve is tricuspid. Aortic valve regurgitation is not  visualized. No aortic stenosis is present.  5. The inferior vena cava is normal in size with greater than 50%  respiratory variability, suggesting right atrial pressure of 3 mmHg.  6. The patient is in atrial fibrillation.   Patient Profile     60 y.o. 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  Assessment & Plan    1.  Atrial fib, RVR - HR 190s on admit, improved w/ IVF and IV Cardizem - HR well controlled in the 80's on PO Cardizem  - 2D echo with normal LVF - will consolidate Cardizem to CD 240mg  daily - his anticoagulation was stopped due to concern of ongoing need for chemo and pancytopenia - his platelet count is 90K and should not be a contraindication at this point for anticoagulation but he does have a lung mass with possible invasion into PA which increases bleeding risks on anticoagulation - although he has a low CHADS2VASC score, he is at risk after being in afib for > 48 hours of cardioembolic events - Oncology has said ok for anticoagulation and he will be transitioned  2. Squamous cell lung CA - started chemo, sig side effects - per Dr Mohammed/IM  3.  Orthostatic hypotension -he had had dizziness and orthostatics were positive on admission  -likely related to ongoing diarrhea with chemo -getting IVF hydration >> BP 108/83mmHg -added compression hose -could consider addition of Midodrine if remains orthostatic after volume repletion and diarrhea subsides  4.  Hypokalemia -K+ 3.3 and Mag 2 -replete to keep K+ > 4 -check BMET in am  For questions or  updates, please contact [facility] Please consult [email or web address] for contact info under        Signed, Fransico Him, MD  05/15/2020, 10:18 AM

## 2020-05-15 NOTE — Plan of Care (Signed)
  Problem: Activity: Goal: Ability to tolerate increased activity will improve Outcome: Progressing   Problem: Health Behavior/Discharge Planning: Goal: Ability to manage health-related needs will improve Outcome: Progressing   Problem: Activity: Goal: Risk for activity intolerance will decrease Outcome: Progressing   Problem: Nutrition: Goal: Adequate nutrition will be maintained Outcome: Progressing   Problem: Coping: Goal: Level of anxiety will decrease Outcome: Progressing

## 2020-05-16 ENCOUNTER — Ambulatory Visit: Payer: 59

## 2020-05-16 ENCOUNTER — Ambulatory Visit
Admission: RE | Admit: 2020-05-16 | Discharge: 2020-05-16 | Disposition: A | Discharge status: Home / Self Care | Payer: 59 | Source: Ambulatory Visit | Attending: Radiation Oncology | Admitting: Radiation Oncology

## 2020-05-16 DIAGNOSIS — A0472 Enterocolitis due to Clostridium difficile, not specified as recurrent: Secondary | ICD-10-CM | POA: Diagnosis not present

## 2020-05-16 DIAGNOSIS — C3491 Malignant neoplasm of unspecified part of right bronchus or lung: Secondary | ICD-10-CM | POA: Diagnosis not present

## 2020-05-16 DIAGNOSIS — Z510 Encounter for antineoplastic radiation therapy: Secondary | ICD-10-CM | POA: Insufficient documentation

## 2020-05-16 DIAGNOSIS — Z87891 Personal history of nicotine dependence: Secondary | ICD-10-CM | POA: Insufficient documentation

## 2020-05-16 DIAGNOSIS — E222 Syndrome of inappropriate secretion of antidiuretic hormone: Secondary | ICD-10-CM

## 2020-05-16 DIAGNOSIS — C3411 Malignant neoplasm of upper lobe, right bronchus or lung: Secondary | ICD-10-CM | POA: Insufficient documentation

## 2020-05-16 DIAGNOSIS — I4891 Unspecified atrial fibrillation: Secondary | ICD-10-CM | POA: Diagnosis not present

## 2020-05-16 DIAGNOSIS — D701 Agranulocytosis secondary to cancer chemotherapy: Secondary | ICD-10-CM | POA: Diagnosis not present

## 2020-05-16 DIAGNOSIS — E876 Hypokalemia: Secondary | ICD-10-CM

## 2020-05-16 LAB — CBC WITH DIFFERENTIAL/PLATELET
Abs Immature Granulocytes: 0.02 10*3/uL (ref 0.00–0.07)
Basophils Absolute: 0 10*3/uL (ref 0.0–0.1)
Basophils Relative: 0 %
Eosinophils Absolute: 0.1 10*3/uL (ref 0.0–0.5)
Eosinophils Relative: 4 %
HCT: 26.9 % — ABNORMAL LOW (ref 39.0–52.0)
Hemoglobin: 8.7 g/dL — ABNORMAL LOW (ref 13.0–17.0)
Immature Granulocytes: 1 %
Lymphocytes Relative: 11 %
Lymphs Abs: 0.3 10*3/uL — ABNORMAL LOW (ref 0.7–4.0)
MCH: 26.8 pg (ref 26.0–34.0)
MCHC: 32.3 g/dL (ref 30.0–36.0)
MCV: 82.8 fL (ref 80.0–100.0)
Monocytes Absolute: 0.3 10*3/uL (ref 0.1–1.0)
Monocytes Relative: 10 %
Neutro Abs: 2.4 10*3/uL (ref 1.7–7.7)
Neutrophils Relative %: 74 %
Platelets: 129 10*3/uL — ABNORMAL LOW (ref 150–400)
RBC: 3.25 MIL/uL — ABNORMAL LOW (ref 4.22–5.81)
RDW: 14.9 % (ref 11.5–15.5)
WBC: 3.3 10*3/uL — ABNORMAL LOW (ref 4.0–10.5)
nRBC: 0 % (ref 0.0–0.2)

## 2020-05-16 LAB — BASIC METABOLIC PANEL
Anion gap: 8 (ref 5–15)
BUN: 6 mg/dL (ref 6–20)
CO2: 21 mmol/L — ABNORMAL LOW (ref 22–32)
Calcium: 7.6 mg/dL — ABNORMAL LOW (ref 8.9–10.3)
Chloride: 106 mmol/L (ref 98–111)
Creatinine, Ser: 0.75 mg/dL (ref 0.61–1.24)
GFR, Estimated: >60 mL/min (ref >60)
Glucose, Bld: 103 mg/dL — ABNORMAL HIGH (ref 70–99)
Potassium: 3.1 mmol/L — ABNORMAL LOW (ref 3.5–5.1)
Sodium: 135 mmol/L (ref 135–145)

## 2020-05-16 MED ORDER — APIXABAN 5 MG PO TABS
5.0000 mg | ORAL_TABLET | Freq: Two times a day (BID) | ORAL | Status: DC
  Administered 2020-05-16: 5 mg via ORAL
  Filled 2020-05-16: qty 1

## 2020-05-16 MED ORDER — DILTIAZEM HCL-DEXTROSE 125-5 MG/125ML-% IV SOLN (PREMIX)
5.0000 mg/h | INTRAVENOUS | Status: DC
  Administered 2020-05-16 – 2020-05-17 (×2): 5 mg/h via INTRAVENOUS
  Filled 2020-05-16 (×2): qty 125

## 2020-05-16 MED ORDER — POTASSIUM CHLORIDE 20 MEQ PO PACK
40.0000 meq | PACK | ORAL | Status: AC
Start: 2020-05-16 — End: 2020-05-16
  Administered 2020-05-16 (×2): 40 meq via ORAL
  Filled 2020-05-16 (×2): qty 2

## 2020-05-16 MED ORDER — DILTIAZEM LOAD VIA INFUSION
10.0000 mg | Freq: Once | INTRAVENOUS | Status: AC
  Administered 2020-05-16: 10 mg via INTRAVENOUS
  Filled 2020-05-16: qty 10

## 2020-05-16 MED ORDER — POTASSIUM CHLORIDE CRYS ER 20 MEQ PO TBCR
40.0000 meq | EXTENDED_RELEASE_TABLET | ORAL | Status: DC
  Administered 2020-05-16 (×2): 40 meq via ORAL
  Filled 2020-05-16 (×2): qty 2

## 2020-05-16 NOTE — Progress Notes (Signed)
ANTICOAGULATION CONSULT NOTE - Initial Consult  Pharmacy Consult for Eliquis Indication: atrial fibrillation  No Known Allergies  Patient Measurements: Height: 5' 11.5" (181.6 cm) Weight: 106.1 kg (234 lb) IBW/kg (Calculated) : 76.45  Vital Signs: Temp: 98.4 F (36.9 C) (04/01 0458) Temp Source: Oral (04/01 0458) BP: 116/76 (04/01 0458) Pulse Rate: 89 (04/01 0458)  Labs: Recent Labs    05/14/20 0424 05/15/20 0342 05/16/20 0338  HGB 8.0* 8.2* 8.7*  HCT 24.4* 25.4* 26.9*  PLT 90* 112* 129*  CREATININE 1.01 0.84 0.75    Estimated Creatinine Clearance: 125.7 mL/min (by C-G formula based on SCr of 0.75 mg/dL).   Medical History: Past Medical History:  Diagnosis Date  . Dyspnea   . Family history of adverse reaction to anesthesia    brother with seizures had episode under anesthesia.  Patient has seizures as well.  [name] GERD (gastroesophageal reflux disease)   . Rheumatoid aortitis   . Squamous cell lung cancer (HCC)     Medications:  Scheduled:  . apixaban  5 mg Oral BID  . cephALEXin  500 mg Oral Q6H  . diltiazem  240 mg Oral Daily  . fidaxomicin  200 mg Oral BID  . magic mouthwash w/lidocaine  5 mL Oral TID AC & HS  . mouth rinse  15 mL Mouth Rinse BID  . PARoxetine  10 mg Oral Daily  . potassium chloride  40 mEq Oral Q4H   Infusions:  . sodium chloride 75 mL/hr at 05/16/20 0008   PRN: acetaminophen, loperamide, ondansetron (ZOFRAN) IV  Assessment: 60 yo male with squamous cell carcinoma of the lung admitted with weakness.  Inpatient problems include C.difficile diarrhea, E.coli bacteremia and new-onset afib.  Pharmacy consulted to dose eliquis.  Hgb and Plts low but slightly improved SCr WNL and stable, CrCl > 100 ml/min No overt bleeding reported No drug interactions noted  Goal of Therapy:  Therapeutic anticoagulation, preventions of stroke   Plan:  Eliquis 5mg  PO bid Provide 30-day free coupon voucher No dose adjustments needed, pharmacy will  sign off note writing but monitor peripherally  Peggyann Juba, PharmD, BCPS Pharmacy: [phone] 05/16/2020,9:42 AM

## 2020-05-16 NOTE — Progress Notes (Signed)
MD Fransico Him notified of pt HR to 170's while ambulating to bathroom. Pt asymptomatic.

## 2020-05-16 NOTE — Progress Notes (Addendum)
[facility] PROGRESS NOTE   John [name] IZT:[id number] DOB: [birth year] DOA: 05/12/2020  PCP: [name], MD  Brief History/Interval Summary: 60 y.o. male with PMH significant for squamous cell carcinoma of lung, rheumatoid arthritis, GERD.  Presented to the emergency department with complaints of generalized weakness.  He was started on systemic chemotherapy recently.  Patient was noted to be hyponatremic and neutropenic.  Had elevated D-dimer.  He was noted to be tachycardic.  Noted to have rapid atrial fibrillation.  He was hospitalized for further management.  Consultants: Medical oncology is following.  Infectious disease.  Cardiology  Procedures:    Transthoracic echocardiogram IMPRESSIONS  1. Left ventricular ejection fraction, by estimation, is 60 to 65%. The left ventricle has normal function. The left ventricle has no regional wall motion abnormalities. Left ventricular diastolic parameters are indeterminate.  2. Right ventricular systolic function is normal. The right ventricular size is normal. Tricuspid regurgitation signal is inadequate for assessing PA pressure.  3. The mitral valve is normal in structure. Trivial mitral valve regurgitation. No evidence of mitral stenosis.  4. The aortic valve is tricuspid. Aortic valve regurgitation is not visualized. No aortic stenosis is present.  5. The inferior vena cava is normal in size with greater than 50% respiratory variability, suggesting right atrial pressure of 3 mmHg.  6. The patient is in atrial fibrillation.  Antibiotics: Anti-infectives (From admission, onward)   Start     Dose/Rate Route Frequency Ordered Stop   05/15/20 1245  fidaxomicin (DIFICID) tablet 200 mg        200 mg Oral 2 times daily 05/15/20 1150 05/25/20 0959   05/15/20 1200  cephALEXin (KEFLEX) capsule 500 mg        500 mg Oral Every 6 hours 05/15/20 0930     05/15/20 1000  vancomycin (VANCOCIN) 50 mg/mL oral solution 125 mg  Status:  Discontinued         125 mg Oral 4 times daily 05/15/20 0644 05/15/20 0645   05/15/20 0800  vancomycin (VANCOCIN) 50 mg/mL oral solution 125 mg  Status:  Discontinued        125 mg Oral 4 times daily 05/15/20 0645 05/15/20 1150   05/13/20 1000  cefTRIAXone (ROCEPHIN) 2 g in sodium chloride 0.9 % 100 mL IVPB  Status:  Discontinued        2 g 200 mL/hr over 30 Minutes Intravenous Every 24 hours 05/13/20 0428 05/15/20 0930   05/12/20 1800  piperacillin-tazobactam (ZOSYN) IVPB 3.375 g  Status:  Discontinued        3.375 g 12.5 mL/hr over 240 Minutes Intravenous Every 8 hours 05/12/20 0909 05/13/20 0428   05/12/20 0945  piperacillin-tazobactam (ZOSYN) IVPB 3.375 g        3.375 g 100 mL/hr over 30 Minutes Intravenous  Once 05/12/20 0858 05/13/20 3825      Subjective/Interval History: Patient continues to have diarrhea.  However he denies any abdominal pain this morning.  No nausea.  He is concerned about the swelling in his right arm.       Assessment/Plan:  Acute C. difficile diarrhea/severe sepsis/febrile neutropenia Patient symptoms started about a week after he received chemotherapy.  He was noted to be neutropenic.  He presented with profuse diarrhea.  GI pathogen panel was unremarkable but C. difficile testing is positive.  This is probably true infection.  Patient was initially started on oral vancomycin.  Changed over to fidaxomicin by infectious disease.  We will start his 10 days from April 4.  Continues to have more than 6-7 bowel movements a day.  Denies any blood in the stool.  No abdominal pain.  No nausea or vomiting.  Neutrophil count has improved.  E. coli bacteremia Blood cultures were repeated on 3/29 and negative so far.  ID has recommended 7-day course.  Sensitivities reviewed.  Patient was changed over to oral Keflex.  Today is day 5.     Atrial fibrillation with RVR Patient was requiring Cardizem infusion.  Patient was seen by cardiology.  Patient underwent echocardiogram which  showed normal systolic function.  TSH was normal.  Changed over to oral Cardizem.  Has been reasonably well controlled.  This morning's telemetry did show rates between 100-120.  We will continue to monitor.  Cardiology continues to follow. Anticoagulation was recommended by cardiology.  After discussions with oncology patient was started on therapeutic Lovenox yesterday.  Platelet counts continue to improve.  Hemoglobin is stable.  Will transition to Eliquis today.     Right arm swelling He has good radial pulses.  Dopplers negative for DVT.  Most likely due to IV infiltration.  Keep arm elevated.  Right lower lobe pulmonary artery filling defect CT angiogram of the chest showed a intraluminal nonocclusive filling defect which could be secondary to direct invasion of the vessel or an eccentric mural thrombus related to mass.  No primary pulmonary thromboembolism was noted.  Patient's medical oncologist is aware.  Stage IV squamous Cell carcinoma of right lung Diagnosed earlier this year.  Followed by Dr. [name].  Status post chemotherapy last week.  Normocytic anemia and thrombocytopenia Most likely secondary to chemotherapeutic effect.  Counts are stable.    Odynophagia Probably related to radiation.  No thrush was noted on exam.  Mouthwash.  History of rheumatoid arthritis On leflunomide as well as Simponi  Hyponatremia secondary to hypovolemia Sodium counts have improved.  Hypokalemia Most likely due to diarrhea.  Continue to supplement aggressively.  Magnesium was 2.0 yesterday.  We will recheck it tomorrow.    GERD Continue PPI.  History of depression Continue Paxil.  Obesity Estimated body mass index is 32.18 kg/m as calculated from the following:   Height as of this encounter: 5' 11.5" (1.816 m).   Weight as of this encounter: 106.1 kg.    DVT Prophylaxis: Lovenox to be changed over to therapeutic Eliquis today Code Status: Full code Family Communication: Discussed  with the patient Disposition Plan: Hopefully return home when improved  Status is: Inpatient  Remains inpatient appropriate because:IV treatments appropriate due to intensity of illness or inability to take PO and Inpatient level of care appropriate due to severity of illness   Dispo: The patient is from: Home              Anticipated d/c is to: Home              Patient currently is not medically stable to d/c.   Difficult to place patient No       Medications:  Scheduled: . cephALEXin  500 mg Oral Q6H  . diltiazem  240 mg Oral Daily  . enoxaparin (LOVENOX) injection  100 mg Subcutaneous BID  . fidaxomicin  200 mg Oral BID  . magic mouthwash w/lidocaine  5 mL Oral TID AC & HS  . mouth rinse  15 mL Mouth Rinse BID  . PARoxetine  10 mg Oral Daily  . potassium chloride  40 mEq Oral Q4H   Continuous: . sodium chloride 75 mL/hr at 05/16/20 0008  RJJ:[id number], loperamide, ondansetron (ZOFRAN) IV   Objective:  Vital Signs  Vitals:   05/15/20 0554 05/15/20 1205 05/15/20 2054 05/16/20 0458  BP: 108/69 117/85 126/85 116/76  Pulse: 88 91 93 89  Resp:  18    Temp:  97.7 F (36.5 C) 98.3 F (36.8 C) 98.4 F (36.9 C)  TempSrc:  Oral Oral Oral  SpO2:  95% 93% 91%  Weight:      Height:        Intake/Output Summary (Last 24 hours) at 05/16/2020 0927 Last data filed at 05/16/2020 0600 Gross per 24 hour  Intake 2235.07 ml  Output --  Net 2235.07 ml   Filed Weights   05/12/20 0231  Weight: 106.1 kg    General appearance: Awake alert.  In no distress Resp: Clear to auscultation bilaterally.  Normal effort Cardio: S1-S2 is normal regular.  No S3-S4.  No rubs murmurs or bruit GI: Abdomen is soft.  Nontender nondistended.  Bowel sounds are present normal.  No masses organomegaly Extremities: Right arm swelling is stable.  Good radial pulses. Neurologic: Alert and oriented x3.  No focal neurological deficits.      Lab Results:  Data Reviewed: I have personally  reviewed following labs and imaging studies  CBC: Recent Labs  Lab 05/12/20 0244 05/13/20 0410 05/14/20 0424 05/15/20 0342 05/16/20 0338  WBC 0.3* 0.5* 1.8* 3.0* 3.3*  NEUTROABS 0.0*  --  1.2* 2.2 2.4  HGB 10.8* 9.0* 8.0* 8.2* 8.7*  HCT 32.8* 27.0* 24.4* 25.4* 26.9*  MCV 79.8* 81.3 82.2 82.5 82.8  PLT 117* 89* 90* 112* 129*    Basic Metabolic Panel: Recent Labs  Lab 05/12/20 1247 05/13/20 0410 05/14/20 0424 05/15/20 0342 05/16/20 0338  NA 130* 132* 133* 133* 135  K 3.6 3.5 3.2* 3.3* 3.1*  CL 96* 97* 100 102 106  CO2 25 23 21* 23 21*  GLUCOSE 133* 109* 96 104* 103*  BUN 17 14 13 9 6   CREATININE 0.92 1.05 1.01 0.84 0.75  CALCIUM 7.4* 7.7* 7.6* 7.7* 7.6*  MG  --   --  1.9 2.0  --   PHOS  --   --  2.2*  --   --     GFR: Estimated Creatinine Clearance: 125.7 mL/min (by C-G formula based on SCr of 0.75 mg/dL).  Liver Function Tests: Recent Labs  Lab 05/12/20 0254  AST 17  ALT 14  ALKPHOS 82  BILITOT 2.8*  PROT 6.7  ALBUMIN 3.2*     Coagulation Profile: Recent Labs  Lab 05/12/20 0508  INR 1.1      Recent Results (from the past 240 hour(s))  SARS CORONAVIRUS 2 (TAT 6-24 HRS) Nasopharyngeal Nasopharyngeal Swab     Status: None   Collection Time: 05/12/20  5:09 AM   Specimen: Nasopharyngeal Swab  Result Value Ref Range Status   SARS Coronavirus 2 NEGATIVE NEGATIVE Final    Comment: (NOTE) SARS-CoV-2 target nucleic acids are NOT DETECTED.  The SARS-CoV-2 RNA is generally detectable in upper and lower respiratory specimens during the acute phase of infection. Negative results do not preclude SARS-CoV-2 infection, do not rule out co-infections with other pathogens, and should not be used as the sole basis for treatment or other patient management decisions. Negative results must be combined with clinical observations, patient history, and epidemiological information. The expected result is Negative.  Fact Sheet for  Patients: SugarRoll.be  Fact Sheet for Healthcare Providers: https://www.woods-mathews.com/  This test is not yet approved or cleared by the [place]  States FDA and  has been authorized for detection and/or diagnosis of SARS-CoV-2 by FDA under an Emergency Use Authorization (EUA). This EUA will remain  in effect (meaning this test can be used) for the duration of the COVID-19 declaration under Se ction 564(b)(1) of the Act, 21 U.S.C. section 360bbb-3(b)(1), unless the authorization is terminated or revoked sooner.  Performed at [facility], [place] [address] [place], [place] [address]   Culture, blood (x 2)     Status: None (Preliminary result)   Collection Time: 05/12/20  9:43 AM   Specimen: BLOOD RIGHT HAND  Result Value Ref Range Status   Specimen Description   Final    BLOOD RIGHT HAND Performed at [facility] [address], [place], [place] [address]    Special Requests   Final    BOTTLES DRAWN AEROBIC ONLY Blood Culture adequate volume Performed at [facility] [address], [place], [place] [address]    Culture   Final    NO GROWTH 3 DAYS Performed at [facility], [place] [address], [place], [place] [address]    Report Status PENDING  Incomplete  Culture, blood (x 2)     Status: Abnormal   Collection Time: 05/12/20  9:43 AM   Specimen: Right Antecubital; Blood  Result Value Ref Range Status   Specimen Description   Final    RIGHT ANTECUBITAL Performed at [facility] [address], [place],  [address]    Special Requests   Final    BOTTLES DRAWN AEROBIC AND ANAEROBIC Blood Culture adequate volume Performed at [facility] [address] [place],  [address]    Culture  Setup Time   Final    IN BOTH AEROBIC AND ANAEROBIC BOTTLES GRAM NEGATIVE RODS Organism ID to follow CRITICAL RESULT CALLED TO, READ BACK BY AND  VERIFIED WITH: L POINDEXTER PHARMD 05/13/20 0405 JDW Performed at [facility], [place] [address], [place],  [address]    Culture ESCHERICHIA COLI (A)  Final   Report Status 05/15/2020 FINAL  Final   Organism ID, Bacteria ESCHERICHIA COLI  Final      Susceptibility   Escherichia coli - MIC*    AMPICILLIN >=32 RESISTANT Resistant     CEFAZOLIN <=4 SENSITIVE Sensitive     CEFEPIME <=0.12 SENSITIVE Sensitive     CEFTAZIDIME <=1 SENSITIVE Sensitive     CEFTRIAXONE <=0.25 SENSITIVE Sensitive     CIPROFLOXACIN <=0.25 SENSITIVE Sensitive     GENTAMICIN <=1 SENSITIVE Sensitive     IMIPENEM <=0.25 SENSITIVE Sensitive     TRIMETH/SULFA <=20 SENSITIVE Sensitive     AMPICILLIN/SULBACTAM >=32 RESISTANT Resistant     PIP/TAZO <=4 SENSITIVE Sensitive     * ESCHERICHIA COLI  Blood Culture ID Panel (Reflexed)     Status: Abnormal   Collection Time: 05/12/20  9:43 AM  Result Value Ref Range Status   Enterococcus faecalis NOT DETECTED NOT DETECTED Final   Enterococcus Faecium NOT DETECTED NOT DETECTED Final   Listeria monocytogenes NOT DETECTED NOT DETECTED Final   Staphylococcus species NOT DETECTED NOT DETECTED Final   Staphylococcus aureus (BCID) NOT DETECTED NOT DETECTED Final   Staphylococcus epidermidis NOT DETECTED NOT DETECTED Final   Staphylococcus lugdunensis NOT DETECTED NOT DETECTED Final   Streptococcus species NOT DETECTED NOT DETECTED Final   Streptococcus agalactiae NOT DETECTED NOT DETECTED Final   Streptococcus pneumoniae NOT DETECTED NOT DETECTED Final   Streptococcus pyogenes NOT  DETECTED NOT DETECTED Final   A.calcoaceticus-baumannii NOT DETECTED NOT DETECTED Final   Bacteroides fragilis NOT DETECTED NOT DETECTED Final   Enterobacterales DETECTED (A) NOT DETECTED Final    Comment: Enterobacterales represent a large order of gram negative bacteria, not a single organism. CRITICAL RESULT CALLED TO, READ BACK BY AND VERIFIED WITH: L POINDEXTER PHAMRD 05/13/20 0405 JDW     Enterobacter cloacae complex NOT DETECTED NOT DETECTED Final   Escherichia coli DETECTED (A) NOT DETECTED Final    Comment: CRITICAL RESULT CALLED TO, READ BACK BY AND VERIFIED WITH: L POINDEXTER PHARMD 05/13/20 0405 JDW    Klebsiella aerogenes NOT DETECTED NOT DETECTED Final   Klebsiella oxytoca NOT DETECTED NOT DETECTED Final   Klebsiella pneumoniae NOT DETECTED NOT DETECTED Final   Proteus species NOT DETECTED NOT DETECTED Final   Salmonella species NOT DETECTED NOT DETECTED Final   Serratia marcescens NOT DETECTED NOT DETECTED Final   Haemophilus influenzae NOT DETECTED NOT DETECTED Final   Neisseria meningitidis NOT DETECTED NOT DETECTED Final   Pseudomonas aeruginosa NOT DETECTED NOT DETECTED Final   Stenotrophomonas maltophilia NOT DETECTED NOT DETECTED Final   Candida albicans NOT DETECTED NOT DETECTED Final   Candida auris NOT DETECTED NOT DETECTED Final   Candida glabrata NOT DETECTED NOT DETECTED Final   Candida krusei NOT DETECTED NOT DETECTED Final   Candida parapsilosis NOT DETECTED NOT DETECTED Final   Candida tropicalis NOT DETECTED NOT DETECTED Final   Cryptococcus neoformans/gattii NOT DETECTED NOT DETECTED Final   CTX-M ESBL NOT DETECTED NOT DETECTED Final   Carbapenem resistance IMP NOT DETECTED NOT DETECTED Final   Carbapenem resistance KPC NOT DETECTED NOT DETECTED Final   Carbapenem resistance NDM NOT DETECTED NOT DETECTED Final   Carbapenem resist OXA 48 LIKE NOT DETECTED NOT DETECTED Final   Carbapenem resistance VIM NOT DETECTED NOT DETECTED Final    Comment: Performed at [facility], [address] [place], [place] [address]  Gastrointestinal Panel by PCR , Stool     Status: None   Collection Time: 05/13/20 12:48 PM   Specimen: Stool  Result Value Ref Range Status   Campylobacter species NOT DETECTED NOT DETECTED Final   Plesimonas shigelloides NOT DETECTED NOT DETECTED Final   Salmonella species NOT DETECTED NOT DETECTED Final   Yersinia  enterocolitica NOT DETECTED NOT DETECTED Final   Vibrio species NOT DETECTED NOT DETECTED Final   Vibrio cholerae NOT DETECTED NOT DETECTED Final   Enteroaggregative E coli (EAEC) NOT DETECTED NOT DETECTED Final   Enteropathogenic E coli (EPEC) NOT DETECTED NOT DETECTED Final   Enterotoxigenic E coli (ETEC) NOT DETECTED NOT DETECTED Final   Shiga like toxin producing E coli (STEC) NOT DETECTED NOT DETECTED Final   Shigella/Enteroinvasive E coli (EIEC) NOT DETECTED NOT DETECTED Final   Cryptosporidium NOT DETECTED NOT DETECTED Final   Cyclospora cayetanensis NOT DETECTED NOT DETECTED Final   Entamoeba histolytica NOT DETECTED NOT DETECTED Final   Giardia lamblia NOT DETECTED NOT DETECTED Final   Adenovirus F40/41 NOT DETECTED NOT DETECTED Final   Astrovirus NOT DETECTED NOT DETECTED Final   Norovirus GI/GII NOT DETECTED NOT DETECTED Final   Rotavirus A NOT DETECTED NOT DETECTED Final   Sapovirus (I, II, IV, and V) NOT DETECTED NOT DETECTED Final    Comment: Performed at [facility], [address] [place], [place] [address]  Culture, blood (routine x 2)     Status: None (Preliminary result)   Collection Time: 05/13/20  2:53 PM  Specimen: BLOOD  Result Value Ref Range Status   Specimen Description   Final    BLOOD RIGHT ANTECUBITAL Performed at [facility] [address] [place], [place] [address]    Special Requests   Final    BOTTLES DRAWN AEROBIC AND ANAEROBIC Blood Culture adequate volume Performed at [facility] [address] [place] [address], [place], [place] [address]    Culture   Final    NO GROWTH 2 DAYS Performed at [facility] [address] [place], [place] [address]    Report Status PENDING  Incomplete  Culture, blood (routine x 2)     Status: None (Preliminary result)   Collection Time: 05/13/20  2:53 PM   Specimen: BLOOD RIGHT HAND  Result Value Ref Range Status   Specimen Description   Final    BLOOD RIGHT  HAND Performed at [facility] [address], [place], [place] [address]    Special Requests   Final    BOTTLES DRAWN AEROBIC AND ANAEROBIC Blood Culture adequate volume Performed at [facility] [address], [place], [place] [address]    Culture   Final    NO GROWTH 2 DAYS Performed at [facility] [address], [place], [place] [address]    Report Status PENDING  Incomplete  C Difficile Quick Screen w PCR reflex     Status: Abnormal   Collection Time: 05/14/20  4:20 PM   Specimen: STOOL  Result Value Ref Range Status   C Diff antigen POSITIVE (A) NEGATIVE Final   C Diff toxin POSITIVE (A) NEGATIVE Final   C Diff interpretation Toxin producing C. difficile detected.  Final    Comment: CRITICAL RESULT CALLED TO, READ BACK BY AND VERIFIED WITH: GREGG,L. RN @1840  ON 03.30.2022 BY COHEN,K Performed at [facility], [place] [address], [place], [place] [address]       Radiology Studies: VAS [place] UPPER EXTREMITY VENOUS DUPLEX  Result Date: 05/15/2020 UPPER VENOUS STUDY  Indications: Swelling, and Recent IV attempted in RUE Risk Factors: Cancer Recently DX'd with Lung CA. Comparison Study: No previous exams Performing Technologist: Rogelia Rohrer  Examination Guidelines: A complete evaluation includes B-mode imaging, spectral Doppler, color Doppler, and power Doppler as needed of all accessible portions of each vessel. Bilateral testing is considered an integral part of a complete examination. Limited examinations for reoccurring indications may be performed as noted.  Right Findings: +----------+------------+---------+-----------+----------+-------+ RIGHT     CompressiblePhasicitySpontaneousPropertiesSummary +----------+------------+---------+-----------+----------+-------+ IJV           Full       Yes       Yes                      +----------+------------+---------+-----------+----------+-------+ Subclavian    Full        Yes       Yes                      +----------+------------+---------+-----------+----------+-------+ Axillary      Full       Yes       Yes                      +----------+------------+---------+-----------+----------+-------+ Brachial      Full       Yes       Yes                      +----------+------------+---------+-----------+----------+-------+  Radial        Full                                          +----------+------------+---------+-----------+----------+-------+ Ulnar         Full                                          +----------+------------+---------+-----------+----------+-------+ Cephalic      Full                                          +----------+------------+---------+-----------+----------+-------+ Basilic       Full       Yes       Yes                      +----------+------------+---------+-----------+----------+-------+ Subcutaneous edema seen from area of brachial vein into forearm.  Left Findings: +----------+------------+---------+-----------+----------+-------+ LEFT      CompressiblePhasicitySpontaneousPropertiesSummary +----------+------------+---------+-----------+----------+-------+ Subclavian    Full       Yes       Yes                      +----------+------------+---------+-----------+----------+-------+  Summary:  Right: No evidence of deep vein thrombosis in the upper extremity. No evidence of superficial vein thrombosis in the upper extremity.  Left: No evidence of thrombosis in the subclavian.  *See table(s) above for measurements and observations.    Preliminary        LOS: 4 days   Nolie Bignell Sealed [employer] [name] on [email or web address]  05/16/2020, 9:27 AM

## 2020-05-16 NOTE — Progress Notes (Signed)
Patient has had 6 watery bowel movements today, so unfortunately no improvement with that. Paged cardiology today about HR going to 170's with ambulation to the bathroom. Cardizem gtt was initiated & [id number] at bedside taking frequent vitals. So far BP is stable & HR at rest 90-115 BUT with ambulation to bathroom HR goes up to 140-150's (afib) MD Fransico Him notified & charge RN Marissa. But at rest pt HR is within ordered parameters so at this time cardizem gtt will not be titrated & will continue to monitor.  14/14 radiation treatment done today.  ** Patient/family very frustrated (understandably) that diarrhea has not improved.  A/Ox4  Afebrile  SQ lovenox SCD's  ABX IVF  Cardizem gtt/Tele

## 2020-05-16 NOTE — Progress Notes (Addendum)
Progress Note  Patient Name: John [name] Date of Encounter: 05/16/2020  [facility] Cardiologist: Fransico Him, MD   Subjective   Still having very frequent diarrhea.   Remains in atrial fibrillation and now with RVR as high as 170's when up going to the bathroom  Inpatient Medications    Scheduled Meds: . apixaban  5 mg Oral BID  . cephALEXin  500 mg Oral Q6H  . diltiazem  240 mg Oral Daily  . fidaxomicin  200 mg Oral BID  . magic mouthwash w/lidocaine  5 mL Oral TID AC & HS  . mouth rinse  15 mL Mouth Rinse BID  . PARoxetine  10 mg Oral Daily  . potassium chloride  40 mEq Oral Q4H   Continuous Infusions: . sodium chloride 75 mL/hr at 05/16/20 0008   PRN Meds: acetaminophen, loperamide, ondansetron (ZOFRAN) IV   Vital Signs    Vitals:   05/15/20 0554 05/15/20 1205 05/15/20 2054 05/16/20 0458  BP: 108/69 117/85 126/85 116/76  Pulse: 88 91 93 89  Resp:  18    Temp:  97.7 F (36.5 C) 98.3 F (36.8 C) 98.4 F (36.9 C)  TempSrc:  Oral Oral Oral  SpO2:  95% 93% 91%  Weight:      Height:        Intake/Output Summary (Last 24 hours) at 05/16/2020 0949 Last data filed at 05/16/2020 0600 Gross per 24 hour  Intake 2235.07 ml  Output --  Net 2235.07 ml   Last 3 Weights 05/12/2020 05/06/2020 04/28/2020  Weight (lbs) 234 lb 234 lb 11.2 oz 234 lb 11.2 oz  Weight (kg) 106.142 kg 106.459 kg 106.459 kg      Telemetry    Atrial fibrillation with RVR in the 140s- Personally Reviewed  ECG    No new EKG to review - Personally Reviewed  Physical Exam   GEN: Well nourished, well developed in no acute distress HEENT: Normal NECK: No JVD; No carotid bruits LYMPHATICS: No lymphadenopathy CARDIAC:irregularly irregular and tachycardic, no murmurs, rubs, gallops RESPIRATORY:  Clear to auscultation without rales, wheezing or rhonchi  ABDOMEN: Soft, non-tender, non-distended MUSCULOSKELETAL:  No edema; No deformity  SKIN: Warm and dry NEUROLOGIC:  Alert and oriented x  3 PSYCHIATRIC:  Normal affect    Labs    High Sensitivity Troponin:   Recent Labs  Lab 05/12/20 0244 05/12/20 0444 05/12/20 1056 05/12/20 1247  TROPONINIHS 7 7 8 7       Chemistry Recent Labs  Lab 05/12/20 0254 05/12/20 1247 05/14/20 0424 05/15/20 0342 05/16/20 0338  NA  --    < > 133* 133* 135  K  --    < > 3.2* 3.3* 3.1*  CL  --    < > 100 102 106  CO2  --    < > 21* 23 21*  GLUCOSE  --    < > 96 104* 103*  BUN  --    < > 13 9 6   CREATININE  --    < > 1.01 0.84 0.75  CALCIUM  --    < > 7.6* 7.7* 7.6*  PROT 6.7  --   --   --   --   ALBUMIN 3.2*  --   --   --   --   AST 17  --   --   --   --   ALT 14  --   --   --   --   ALKPHOS 82  --   --   --   --  BILITOT 2.8*  --   --   --   --   GFRNONAA  --    < > >60 >60 >60  ANIONGAP  --    < > 12 8 8    < > = values in this interval not displayed.     Hematology Recent Labs  Lab 05/14/20 0424 05/15/20 0342 05/16/20 0338  WBC 1.8* 3.0* 3.3*  RBC 2.97* 3.08* 3.25*  HGB 8.0* 8.2* 8.7*  HCT 24.4* 25.4* 26.9*  MCV 82.2 82.5 82.8  MCH 26.9 26.6 26.8  MCHC 32.8 32.3 32.3  RDW 14.6 14.7 14.9  PLT 90* 112* 129*    BNPNo results for input(s): BNP, PROBNP in the last 168 hours.   DDimer  Recent Labs  Lab 05/12/20 0244  DDIMER 2.91*     CHA2DS2-VASc Score = 0  This indicates a 0.2% annual risk of stroke. The patient's score is based upon: CHF History: No HTN History: No Diabetes History: No Stroke History: No Vascular Disease History: No Age Score: 0 Gender Score: 0       Radiology    VAS [place] UPPER EXTREMITY VENOUS DUPLEX  Result Date: 05/15/2020 UPPER VENOUS STUDY  Indications: Swelling, and Recent IV attempted in RUE Risk Factors: Cancer Recently DX'd with Lung CA. Comparison Study: No previous exams Performing Technologist: Rogelia Rohrer  Examination Guidelines: A complete evaluation includes B-mode imaging, spectral Doppler, color Doppler, and power Doppler as needed of all accessible portions of  each vessel. Bilateral testing is considered an integral part of a complete examination. Limited examinations for reoccurring indications may be performed as noted.  Right Findings: +----------+------------+---------+-----------+----------+-------+ RIGHT     CompressiblePhasicitySpontaneousPropertiesSummary +----------+------------+---------+-----------+----------+-------+ IJV           Full       Yes       Yes                      +----------+------------+---------+-----------+----------+-------+ Subclavian    Full       Yes       Yes                      +----------+------------+---------+-----------+----------+-------+ Axillary      Full       Yes       Yes                      +----------+------------+---------+-----------+----------+-------+ Brachial      Full       Yes       Yes                      +----------+------------+---------+-----------+----------+-------+ Radial        Full                                          +----------+------------+---------+-----------+----------+-------+ Ulnar         Full                                          +----------+------------+---------+-----------+----------+-------+ Cephalic      Full                                          +----------+------------+---------+-----------+----------+-------+  Basilic       Full       Yes       Yes                      +----------+------------+---------+-----------+----------+-------+ Subcutaneous edema seen from area of brachial vein into forearm.  Left Findings: +----------+------------+---------+-----------+----------+-------+ LEFT      CompressiblePhasicitySpontaneousPropertiesSummary +----------+------------+---------+-----------+----------+-------+ Subclavian    Full       Yes       Yes                      +----------+------------+---------+-----------+----------+-------+  Summary:  Right: No evidence of deep vein thrombosis in the upper extremity.  No evidence of superficial vein thrombosis in the upper extremity.  Left: No evidence of thrombosis in the subclavian.  *See table(s) above for measurements and observations.    Preliminary     Cardiac Studies   2D echo 05/12/2020 IMPRESSIONS   1. Left ventricular ejection fraction, by estimation, is 60 to 65%. The  left ventricle has normal function. The left ventricle has no regional  wall motion abnormalities. Left ventricular diastolic parameters are  indeterminate.  2. Right ventricular systolic function is normal. The right ventricular  size is normal. Tricuspid regurgitation signal is inadequate for assessing  PA pressure.  3. The mitral valve is normal in structure. Trivial mitral valve  regurgitation. No evidence of mitral stenosis.  4. The aortic valve is tricuspid. Aortic valve regurgitation is not  visualized. No aortic stenosis is present.  5. The inferior vena cava is normal in size with greater than 50%  respiratory variability, suggesting right atrial pressure of 3 mmHg.  6. The patient is in atrial fibrillation.   Patient Profile     60 y.o. 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  Assessment & Plan    1.  Atrial fib, RVR - HR 190s on admit, improved w/ IVF and IV Cardizem - HR well controlled in the 80's on PO Cardizem initially but now HR much higher ? Combination of dehydration for severe diarrhea as well as poor PO absorption of CCB - 2D echo with normal LVF - will change PO Cardizem back to IV Cardizem gtt until diarrhea has improved - his anticoagulation was stopped due to concern of ongoing need for chemo and pancytopenia - his platelet count is 129K and should not be a contraindication at this point for anticoagulation but he does have a lung mass with possible invasion into PA which increases bleeding risks on  anticoagulation - although he has a low CHADS2VASC score, he is at risk after being in afib for > 48 hours of cardioembolic events - Oncology has said ok for anticoagulation and he will be transitioned Eliquis from Lovenox  2. Squamous cell lung CA - started chemo, sig side effects - per Dr Mohammed/IM  3.  Orthostatic hypotension -he had had dizziness and orthostatics were positive on admission  -likely related to ongoing diarrhea with chemo and now C Diff infection -getting IVF hydration >> BP 108/81mmHg -added compression hose  4.  Hypokalemia -K+ 3.1 and Mag 2 -likely related to GI losses from severe C Diff diarrhea -replete to keep K+ > 4 -check BMET in am  5.  C diff colitis -per [facility] -still with significant diarrhea  For questions or updates, please contact [place] Please consult [email or web address] for contact info under        Signed, Fransico Him, MD  05/16/2020, 9:49 AM

## 2020-05-17 DIAGNOSIS — A0472 Enterocolitis due to Clostridium difficile, not specified as recurrent: Secondary | ICD-10-CM | POA: Diagnosis not present

## 2020-05-17 DIAGNOSIS — C3491 Malignant neoplasm of unspecified part of right bronchus or lung: Secondary | ICD-10-CM | POA: Diagnosis not present

## 2020-05-17 DIAGNOSIS — I4891 Unspecified atrial fibrillation: Secondary | ICD-10-CM | POA: Diagnosis not present

## 2020-05-17 DIAGNOSIS — D701 Agranulocytosis secondary to cancer chemotherapy: Secondary | ICD-10-CM | POA: Diagnosis not present

## 2020-05-17 LAB — CBC WITH DIFFERENTIAL/PLATELET
Abs Immature Granulocytes: 0.02 10*3/uL (ref 0.00–0.07)
Basophils Absolute: 0 10*3/uL (ref 0.0–0.1)
Basophils Relative: 0 %
Eosinophils Absolute: 0.2 10*3/uL (ref 0.0–0.5)
Eosinophils Relative: 5 %
HCT: 27 % — ABNORMAL LOW (ref 39.0–52.0)
Hemoglobin: 8.5 g/dL — ABNORMAL LOW (ref 13.0–17.0)
Immature Granulocytes: 1 %
Lymphocytes Relative: 11 %
Lymphs Abs: 0.3 10*3/uL — ABNORMAL LOW (ref 0.7–4.0)
MCH: 26.6 pg (ref 26.0–34.0)
MCHC: 31.5 g/dL (ref 30.0–36.0)
MCV: 84.4 fL (ref 80.0–100.0)
Monocytes Absolute: 0.4 10*3/uL (ref 0.1–1.0)
Monocytes Relative: 14 %
Neutro Abs: 2.1 10*3/uL (ref 1.7–7.7)
Neutrophils Relative %: 69 %
Platelets: 129 10*3/uL — ABNORMAL LOW (ref 150–400)
RBC: 3.2 MIL/uL — ABNORMAL LOW (ref 4.22–5.81)
RDW: 15.4 % (ref 11.5–15.5)
WBC: 3 10*3/uL — ABNORMAL LOW (ref 4.0–10.5)
nRBC: 0 % (ref 0.0–0.2)

## 2020-05-17 LAB — CULTURE, BLOOD (ROUTINE X 2)
Culture: NO GROWTH
Special Requests: ADEQUATE

## 2020-05-17 LAB — BASIC METABOLIC PANEL
Anion gap: 7 (ref 5–15)
BUN: 5 mg/dL — ABNORMAL LOW (ref 6–20)
CO2: 22 mmol/L (ref 22–32)
Calcium: 7.7 mg/dL — ABNORMAL LOW (ref 8.9–10.3)
Chloride: 107 mmol/L (ref 98–111)
Creatinine, Ser: 0.8 mg/dL (ref 0.61–1.24)
GFR, Estimated: >60 mL/min (ref >60)
Glucose, Bld: 103 mg/dL — ABNORMAL HIGH (ref 70–99)
Potassium: 3.7 mmol/L (ref 3.5–5.1)
Sodium: 136 mmol/L (ref 135–145)

## 2020-05-17 LAB — MAGNESIUM: Magnesium: 1.8 mg/dL (ref 1.7–2.4)

## 2020-05-17 MED ORDER — ENOXAPARIN SODIUM 40 MG/0.4ML ~~LOC~~ SOLN
40.0000 mg | SUBCUTANEOUS | Status: DC
  Administered 2020-05-18 – 2020-05-20 (×3): 40 mg via SUBCUTANEOUS
  Filled 2020-05-17 (×3): qty 0.4

## 2020-05-17 MED ORDER — MAGNESIUM SULFATE 2 GM/50ML IV SOLN
2.0000 g | Freq: Once | INTRAVENOUS | Status: AC
  Administered 2020-05-17: 2 g via INTRAVENOUS
  Filled 2020-05-17: qty 50

## 2020-05-17 MED ORDER — AMIODARONE HCL IN DEXTROSE 360-4.14 MG/200ML-% IV SOLN
60.0000 mg/h | INTRAVENOUS | Status: AC
  Administered 2020-05-17 (×2): 60 mg/h via INTRAVENOUS
  Filled 2020-05-17 (×2): qty 200

## 2020-05-17 MED ORDER — AMIODARONE HCL IN DEXTROSE 360-4.14 MG/200ML-% IV SOLN
30.0000 mg/h | INTRAVENOUS | Status: DC
  Administered 2020-05-17 – 2020-05-18 (×3): 30 mg/h via INTRAVENOUS
  Filled 2020-05-17 (×3): qty 200

## 2020-05-17 MED ORDER — AMIODARONE LOAD VIA INFUSION
150.0000 mg | Freq: Once | INTRAVENOUS | Status: AC
  Administered 2020-05-17: 150 mg via INTRAVENOUS
  Filled 2020-05-17: qty 83.34

## 2020-05-17 MED ORDER — POTASSIUM CHLORIDE 20 MEQ PO PACK
40.0000 meq | PACK | Freq: Once | ORAL | Status: AC
  Administered 2020-05-17: 40 meq via ORAL
  Filled 2020-05-17: qty 2

## 2020-05-17 NOTE — Plan of Care (Signed)
Patient tolerating heart healthy diet, reports 2 very small loose stools on 7 a to 7 p shift. HR 90's to 110's generally on Amiodarone drip, will increase with exertion. Wife at bedside.

## 2020-05-17 NOTE — Progress Notes (Signed)
Progress Note  Patient Name: John [name] Date of Encounter: 05/17/2020  [facility] Cardiologist: Fransico Him, MD    Patient Profile     60 y.o. 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 apixaban  5 mg Oral BID  . cephALEXin  500 mg Oral Q6H  . fidaxomicin  200 mg Oral BID  . magic mouthwash w/lidocaine  5 mL Oral TID AC & HS  . mouth rinse  15 mL Mouth Rinse BID  . PARoxetine  10 mg Oral Daily   Continuous Infusions: . sodium chloride 50 mL/hr at 05/16/20 1312  . diltiazem (CARDIZEM) infusion 5 mg/hr (05/17/20 0559)   PRN Meds: acetaminophen, ondansetron (ZOFRAN) IV   Vital Signs    Vitals:   05/16/20 1730 05/16/20 2300 05/17/20 0300 05/17/20 0800  BP: 122/73     Pulse:      Resp: (!) 23     Temp:  97.9 F (36.6 C) 97.7 F (36.5 C)   TempSrc:  Oral Oral   SpO2:    92%  Weight:      Height:        Intake/Output Summary (Last 24 hours) at 05/17/2020 0818 Last data filed at 05/17/2020 0600 Gross per 24 hour  Intake 1560.17 ml  Output --  Net 1560.17 ml   Last 3 Weights 05/12/2020 05/06/2020 04/28/2020  Weight (lbs) 234 lb 234 lb 11.2 oz 234 lb 11.2 oz  Weight (kg) 106.142 kg 106.459 kg 106.459 kg      Telemetry    Atrial fibrillation with rates ranging from 100s-140s- Personally Reviewed  ECG    No new EKG to review - Personally Reviewed  Physical Exam  Well developed and nourished in no acute distress HENT normal Neck supple  Clear Irregularly irregular rate and rhythm with rapid ventricular response, no murmurs or gallops Abd-soft with active BS  without hepatomegaly No Clubbing cyanosis edema Skin-warm and dry A & Oriented  Grossly normal sensory and motor function    Labs    High Sensitivity Troponin:   Recent Labs  Lab 05/12/20 0244 05/12/20 0444 05/12/20 1056 05/12/20 1247  TROPONINIHS 7 7 8 7       Chemistry Recent Labs  Lab 05/12/20 0254 05/12/20 1247 05/15/20 0342 05/16/20 0338 05/17/20 0339  NA  --    < > 133* 135 136  K  --    < > 3.3* 3.1* 3.7  CL  --    < > 102 106 107  CO2  --    < > 23 21* 22  GLUCOSE  --    < > 104* 103* 103*  BUN  --    < > 9 6 5*  CREATININE  --    < > 0.84 0.75 0.80  CALCIUM  --    < > 7.7* 7.6* 7.7*  PROT 6.7  --   --   --   --   ALBUMIN 3.2*  --   --   --   --   AST 17  --   --   --   --  ALT 14  --   --   --   --   ALKPHOS 82  --   --   --   --   BILITOT 2.8*  --   --   --   --   GFRNONAA  --    < > >60 >60 >60  ANIONGAP  --    < > 8 8 7    < > = values in this interval not displayed.     Hematology Recent Labs  Lab 05/15/20 0342 05/16/20 0338 05/17/20 0339  WBC 3.0* 3.3* 3.0*  RBC 3.08* 3.25* 3.20*  HGB 8.2* 8.7* 8.5*  HCT 25.4* 26.9* 27.0*  MCV 82.5 82.8 84.4  MCH 26.6 26.8 26.6  MCHC 32.3 32.3 31.5  RDW 14.7 14.9 15.4  PLT 112* 129* 129*    BNPNo results for input(s): BNP, PROBNP in the last 168 hours.   DDimer  Recent Labs  Lab 05/12/20 0244  DDIMER 2.91*     CHA2DS2-VASc Score = 0  This indicates a 0.2% annual risk of stroke. The patient's score is based upon: CHF History: No HTN History: No Diabetes History: No Stroke History: No Vascular Disease History: No Age Score: 0 Gender Score: 0       Radiology    VAS [place] UPPER EXTREMITY VENOUS DUPLEX  Result Date: 05/16/2020 UPPER VENOUS STUDY  Indications: Swelling, and Recent IV attempted in RUE Risk Factors: Cancer Recently DX'd with Lung CA. Comparison Study: No previous exams Performing Technologist: Rogelia Rohrer  Examination Guidelines: A complete evaluation includes B-mode imaging,  spectral Doppler, color Doppler, and power Doppler as needed of all accessible portions of each vessel. Bilateral testing is considered an integral part of a complete examination. Limited examinations for reoccurring indications may be performed as noted.  Right Findings: +----------+------------+---------+-----------+----------+-------+ RIGHT     CompressiblePhasicitySpontaneousPropertiesSummary +----------+------------+---------+-----------+----------+-------+ IJV           Full       Yes       Yes                      +----------+------------+---------+-----------+----------+-------+ Subclavian    Full       Yes       Yes                      +----------+------------+---------+-----------+----------+-------+ Axillary      Full       Yes       Yes                      +----------+------------+---------+-----------+----------+-------+ Brachial      Full       Yes       Yes                      +----------+------------+---------+-----------+----------+-------+ Radial        Full                                          +----------+------------+---------+-----------+----------+-------+ Ulnar         Full                                          +----------+------------+---------+-----------+----------+-------+ Cephalic  Full                                          +----------+------------+---------+-----------+----------+-------+ Basilic       Full       Yes       Yes                      +----------+------------+---------+-----------+----------+-------+ Subcutaneous edema seen from area of brachial vein into forearm.  Left Findings: +----------+------------+---------+-----------+----------+-------+ LEFT      CompressiblePhasicitySpontaneousPropertiesSummary +----------+------------+---------+-----------+----------+-------+ Subclavian    Full       Yes       Yes                       +----------+------------+---------+-----------+----------+-------+  Summary:  Right: No evidence of deep vein thrombosis in the upper extremity. No evidence of superficial vein thrombosis in the upper extremity.  Left: No evidence of thrombosis in the subclavian.  *See table(s) above for measurements and observations.  Diagnosing physician: Monica Martinez MD Electronically signed by Monica Martinez MD on 05/16/2020 at 1:45:52 PM.    Final     Cardiac Studies   2D echo 05/12/2020 IMPRESSIONS   1. Left ventricular ejection fraction, by estimation, is 60 to 65%. The  left ventricle has normal function. The left ventricle has no regional  wall motion abnormalities. Left ventricular diastolic parameters are  indeterminate.  2. Right ventricular systolic function is normal. The right ventricular  size is normal. Tricuspid regurgitation signal is inadequate for assessing  PA pressure.  3. The mitral valve is normal in structure. Trivial mitral valve  regurgitation. No evidence of mitral stenosis.  4. The aortic valve is tricuspid. Aortic valve regurgitation is not  visualized. No aortic stenosis is present.  5. The inferior vena cava is normal in size with greater than 50%  respiratory variability, suggesting right atrial pressure of 3 mmHg.  6. The patient is in atrial fibrillation.    Assessment & Plan    1.  Atrial fib, RVR   Atrial fibrillation with a rapid ventricular rate  C. 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 Eugene Garnet, the locally invasive tumor in the pulmonary artery makes this much higher risk and I would wonder about the relative benefit of anticoagulation-- I spoke pulmonary CCM x 2   The issue I guess depends largely on  whether this is an intraluminal thrombus versus an invasive tumor as to the risk/benefit assessment of anticoagulation and just as one might expect in such a difficult situation, 1 CCM is in favor in 1 CCM is against anticoagulation. Lengthy discussion with the patient regarding this issue.  He is aware of the concern about "bleeding out with rupture" It is presumed that this is not a pulmonary embolism; hence, the role of anticoagulation is the prevention of propagation of intraluminal thrombus and or the reduction of risk of thromboembolism in the context of atrial fibrillation.  His CHA2DS2-VASc score is 0.  His greater concern is that bleeding out with rupture his life ending in the context of his thromboembolic risk these issues are distributed over a time domain that is  much longer.  It is his inclination not to be on anticoagulation so as to reduce the risk of rupture and bleeding out.  I share those concerns and given the benefits of anticoagulation that are relatively limited as outlined above, we have elected to stop the anticoagulation.  We will start him on DVT prophylaxis  Atrial fibrillation rate control has been a challenge; we will discontinue the Cardizem and put him on amiodarone, not frequently associated with reversion to sinus rhythm     Signed, [name], MD  05/17/2020, 8:18 AM

## 2020-05-17 NOTE — Progress Notes (Addendum)
[facility] PROGRESS NOTE   John [name] NWG:[id number] DOB: Dec 09, 1960 DOA: 05/12/2020  PCP: [name], MD  Brief History/Interval Summary: 60 y.o. male with PMH significant for squamous cell carcinoma of lung, rheumatoid arthritis, GERD.  Presented to the emergency department with complaints of generalized weakness.  He was started on systemic chemotherapy recently.  Patient was noted to be hyponatremic and neutropenic.  Had elevated D-dimer.  He was noted to be tachycardic.  Noted to have rapid atrial fibrillation.  He was hospitalized for further management.  Consultants: Medical oncology is following.  Infectious disease.  Cardiology  Procedures:    Transthoracic echocardiogram IMPRESSIONS  1. Left ventricular ejection fraction, by estimation, is 60 to 65%. The left ventricle has normal function. The left ventricle has no regional wall motion abnormalities. Left ventricular diastolic parameters are indeterminate.  2. Right ventricular systolic function is normal. The right ventricular size is normal. Tricuspid regurgitation signal is inadequate for assessing PA pressure.  3. The mitral valve is normal in structure. Trivial mitral valve regurgitation. No evidence of mitral stenosis.  4. The aortic valve is tricuspid. Aortic valve regurgitation is not visualized. No aortic stenosis is present.  5. The inferior vena cava is normal in size with greater than 50% respiratory variability, suggesting right atrial pressure of 3 mmHg.  6. The patient is in atrial fibrillation.  Antibiotics: Anti-infectives (From admission, onward)   Start     Dose/Rate Route Frequency Ordered Stop   05/15/20 1245  fidaxomicin (DIFICID) tablet 200 mg        200 mg Oral 2 times daily 05/15/20 1150 05/25/20 0959   05/15/20 1200  cephALEXin (KEFLEX) capsule 500 mg        500 mg Oral Every 6 hours 05/15/20 0930 05/18/20 2359   05/15/20 1000  vancomycin (VANCOCIN) 50 mg/mL oral solution 125 mg  Status:   Discontinued        125 mg Oral 4 times daily 05/15/20 0644 05/15/20 0645   05/15/20 0800  vancomycin (VANCOCIN) 50 mg/mL oral solution 125 mg  Status:  Discontinued        125 mg Oral 4 times daily 05/15/20 0645 05/15/20 1150   05/13/20 1000  cefTRIAXone (ROCEPHIN) 2 g in sodium chloride 0.9 % 100 mL IVPB  Status:  Discontinued        2 g 200 mL/hr over 30 Minutes Intravenous Every 24 hours 05/13/20 0428 05/15/20 0930   05/12/20 1800  piperacillin-tazobactam (ZOSYN) IVPB 3.375 g  Status:  Discontinued        3.375 g 12.5 mL/hr over 240 Minutes Intravenous Every 8 hours 05/12/20 0909 05/13/20 0428   05/12/20 0945  piperacillin-tazobactam (ZOSYN) IVPB 3.375 g        3.375 g 100 mL/hr over 30 Minutes Intravenous  Once 05/12/20 0858 05/13/20 5784      Subjective/Interval History: Patient states that he is feeling really well.  Denies any shortness of breath.  He states that his stool is starting to form up a little bit more.  Has also noticed decrease in the amount of stool that he has been passing.  Denies any abdominal pain nausea or vomiting.      Assessment/Plan:  Acute C. difficile diarrhea/severe sepsis/febrile neutropenia Patient symptoms started about a week after he received chemotherapy.  He was noted to be neutropenic.  He presented with profuse diarrhea.  GI pathogen panel was unremarkable but C. difficile testing is positive.  This is probably true infection.  Patient was  initially started on oral vancomycin.  Changed over to fidaxomicin by infectious disease.  We will start his 10 days from April 4 when he would complete his course of Keflex. Continues to have significant diarrhea although patient reports more formed stool from last night.  States that he thinks he is getting better.  Denies any abdominal pain.  His abdomen remains benign.  Continue current treatment for now.  Neutrophil counts have improved.   E. coli bacteremia Blood cultures were repeated on 3/29 and  negative so far.  ID has recommended 7-day course.  Sensitivities reviewed.  Patient was changed over to oral Keflex.  Today is day 6.     Atrial fibrillation with RVR Patient was requiring Cardizem infusion.  Patient was seen by cardiology.  Patient underwent echocardiogram which showed normal systolic function.  TSH was normal.   He was changed over to oral Cardizem.  However her heart rate noted to be poorly controlled yesterday and so cardiology placed back on intravenous Cardizem.  Cardiology also recommended oral anticoagulation and so patient was initially started on therapeutic Lovenox which was changed over to Eliquis yesterday. Cardiology notes from this morning reviewed.  Looks like they have readdressed this issue with the patient.  And after this discussion patient and cardiology have decided to stop anticoagulation.   Cardizem infusion has been changed over to amiodarone infusion this morning.  Right arm swelling He has good radial pulses.  Dopplers negative for DVT.  Most likely due to IV infiltration.  Keep arm elevated.  Right lower lobe pulmonary artery filling defect CT angiogram of the chest showed a intraluminal nonocclusive filling defect which could be secondary to direct invasion of the vessel or an eccentric mural thrombus related to mass.  No primary pulmonary thromboembolism was noted.  Patient's medical oncologist is aware.  Stage IV squamous Cell carcinoma of right lung Diagnosed earlier this year.  Followed by Dr. [name].  Status post chemotherapy last week.  He was also getting radiation treatment and he completed this treatment on 4/1.  Normocytic anemia and thrombocytopenia Most likely secondary to chemotherapeutic effect.  Counts are stable.    Odynophagia Probably related to radiation.  No thrush was noted on exam.  Mouthwash.  History of rheumatoid arthritis On leflunomide as well as Simponi  Hyponatremia secondary to hypovolemia Sodium counts have  improved.  Hypokalemia Most likely due to diarrhea.  Potassium level noted to be better today.  Magnesium 1.8.  We will order additional dose of potassium today.   GERD Continue PPI.  History of depression Continue Paxil.  Obesity Estimated body mass index is 32.18 kg/m as calculated from the following:   Height as of this encounter: 5' 11.5" (1.816 m).   Weight as of this encounter: 106.1 kg.    DVT Prophylaxis: Eliquis discontinued by cardiology.  We will place him on prophylactic doses of Lovenox. Code Status: Full code Family Communication: Discussed with the patient Disposition Plan: Hopefully return home when improved.  Continue to mobilize as heart rate is able to tolerate.  Status is: Inpatient  Remains inpatient appropriate because:IV treatments appropriate due to intensity of illness or inability to take PO and Inpatient level of care appropriate due to severity of illness   Dispo: The patient is from: Home              Anticipated d/c is to: Home              Patient currently is not medically stable  to d/c.   Difficult to place patient No       Medications:  Scheduled: . amiodarone  150 mg Intravenous Once  . cephALEXin  500 mg Oral Q6H  . fidaxomicin  200 mg Oral BID  . magic mouthwash w/lidocaine  5 mL Oral TID AC & HS  . mouth rinse  15 mL Mouth Rinse BID  . PARoxetine  10 mg Oral Daily   Continuous: . sodium chloride 50 mL/hr at 05/16/20 1312  . amiodarone     Followed by  . amiodarone     WJX:BJYNWGNFAOZHY, ondansetron (ZOFRAN) IV   Objective:  Vital Signs  Vitals:   05/16/20 2300 05/17/20 0300 05/17/20 0800 05/17/20 0801  BP:    123/81  Pulse:      Resp:      Temp: 97.9 F (36.6 C) 97.7 F (36.5 C)    TempSrc: Oral Oral    SpO2:   92%   Weight:      Height:        Intake/Output Summary (Last 24 hours) at 05/17/2020 0945 Last data filed at 05/17/2020 0600 Gross per 24 hour  Intake 1560.17 ml  Output --  Net 1560.17 ml    Filed Weights   05/12/20 0231  Weight: 106.1 kg    General appearance: Awake alert.  In no distress Resp: Clear to auscultation bilaterally.  Normal effort Cardio: S1-S2 is irregularly irregular tachycardic.  No S3-S4.  No rubs or bruit.   GI: Abdomen is soft.  Nontender nondistended.  Bowel sounds are present normal.  No masses organomegaly Extremities: No edema.  Full range of motion of lower extremities. Neurologic: Alert and oriented x3.  No focal neurological deficits.       Lab Results:  Data Reviewed: I have personally reviewed following labs and imaging studies  CBC: Recent Labs  Lab 05/12/20 0244 05/13/20 0410 05/14/20 0424 05/15/20 0342 05/16/20 0338 05/17/20 0339  WBC 0.3* 0.5* 1.8* 3.0* 3.3* 3.0*  NEUTROABS 0.0*  --  1.2* 2.2 2.4 2.1  HGB 10.8* 9.0* 8.0* 8.2* 8.7* 8.5*  HCT 32.8* 27.0* 24.4* 25.4* 26.9* 27.0*  MCV 79.8* 81.3 82.2 82.5 82.8 84.4  PLT 117* 89* 90* 112* 129* 129*    Basic Metabolic Panel: Recent Labs  Lab 05/13/20 0410 05/14/20 0424 05/15/20 0342 05/16/20 0338 05/17/20 0339  NA 132* 133* 133* 135 136  K 3.5 3.2* 3.3* 3.1* 3.7  CL 97* 100 102 106 107  CO2 23 21* 23 21* 22  GLUCOSE 109* 96 104* 103* 103*  BUN 14 13 9 6  5*  CREATININE 1.05 1.01 0.84 0.75 0.80  CALCIUM 7.7* 7.6* 7.7* 7.6* 7.7*  MG  --  1.9 2.0  --  1.8  PHOS  --  2.2*  --   --   --     GFR: Estimated Creatinine Clearance: 125.7 mL/min (by C-G formula based on SCr of 0.8 mg/dL).  Liver Function Tests: Recent Labs  Lab 05/12/20 0254  AST 17  ALT 14  ALKPHOS 82  BILITOT 2.8*  PROT 6.7  ALBUMIN 3.2*     Coagulation Profile: Recent Labs  Lab 05/12/20 0508  INR 1.1      Recent Results (from the past 240 hour(s))  SARS CORONAVIRUS 2 (TAT 6-24 HRS) Nasopharyngeal Nasopharyngeal Swab     Status: None   Collection Time: 05/12/20  5:09 AM   Specimen: Nasopharyngeal Swab  Result Value Ref Range Status   SARS Coronavirus 2 NEGATIVE NEGATIVE Final  Comment: (NOTE) SARS-CoV-2 target nucleic acids are NOT DETECTED.  The SARS-CoV-2 RNA is generally detectable in upper and lower respiratory specimens during the acute phase of infection. Negative results do not preclude SARS-CoV-2 infection, do not rule out co-infections with other pathogens, and should not be used as the sole basis for treatment or other patient management decisions. Negative results must be combined with clinical observations, patient history, and epidemiological information. The expected result is Negative.  Fact Sheet for Patients: SugarRoll.be  Fact Sheet for Healthcare Providers: https://www.woods-mathews.com/  This test is not yet approved or cleared by the [place] FDA and  has been authorized for detection and/or diagnosis of SARS-CoV-2 by FDA under an Emergency Use Authorization (EUA). This EUA will remain  in effect (meaning this test can be used) for the duration of the COVID-19 declaration under Se ction 564(b)(1) of the Act, 21 U.S.C. section 360bbb-3(b)(1), unless the authorization is terminated or revoked sooner.  Performed at [facility], [place] [address], [place], [place] [address]   Culture, blood (x 2)     Status: None (Preliminary result)   Collection Time: 05/12/20  9:43 AM   Specimen: BLOOD RIGHT HAND  Result Value Ref Range Status   Specimen Description   Final    BLOOD RIGHT HAND Performed at [facility] [address], [place], [place] [address]    Special Requests   Final    BOTTLES DRAWN AEROBIC ONLY Blood Culture adequate volume Performed at [facility] [address], [place], [place] [address]    Culture   Final    NO GROWTH 4 DAYS Performed at [facility], [place] [address], [place], [place] [address]    Report Status PENDING  Incomplete  Culture, blood (x 2)     Status: Abnormal   Collection Time: 05/12/20  9:43 AM    Specimen: Right Antecubital; Blood  Result Value Ref Range Status   Specimen Description   Final    RIGHT ANTECUBITAL Performed at [facility] [address], [place], [place] [address]    Special Requests   Final    BOTTLES DRAWN AEROBIC AND ANAEROBIC Blood Culture adequate volume Performed at [facility] [address], [place], [place] [address]    Culture  Setup Time   Final    IN BOTH AEROBIC AND ANAEROBIC BOTTLES GRAM NEGATIVE RODS Organism ID to follow CRITICAL RESULT CALLED TO, READ BACK BY AND VERIFIED WITH: L POINDEXTER PHARMD 05/13/20 0405 JDW Performed at [facility], [place] [address] [place], [place] [address]    Culture ESCHERICHIA COLI (A)  Final   Report Status 05/15/2020 FINAL  Final   Organism ID, Bacteria ESCHERICHIA COLI  Final      Susceptibility   Escherichia coli - MIC*    AMPICILLIN >=32 RESISTANT Resistant     CEFAZOLIN <=4 SENSITIVE Sensitive     CEFEPIME <=0.12 SENSITIVE Sensitive     CEFTAZIDIME <=1 SENSITIVE Sensitive     CEFTRIAXONE <=0.25 SENSITIVE Sensitive     CIPROFLOXACIN <=0.25 SENSITIVE Sensitive     GENTAMICIN <=1 SENSITIVE Sensitive     IMIPENEM <=0.25 SENSITIVE Sensitive     TRIMETH/SULFA <=20 SENSITIVE Sensitive     AMPICILLIN/SULBACTAM >=32 RESISTANT Resistant     PIP/TAZO <=4 SENSITIVE Sensitive     * ESCHERICHIA COLI  Blood Culture ID Panel (Reflexed)     Status: Abnormal   Collection Time: 05/12/20  9:43 AM  Result Value Ref Range Status   Enterococcus faecalis NOT DETECTED NOT DETECTED Final   Enterococcus Faecium NOT DETECTED NOT DETECTED Final   Listeria monocytogenes NOT DETECTED NOT DETECTED Final   Staphylococcus species NOT DETECTED NOT DETECTED Final   Staphylococcus aureus (BCID) NOT DETECTED NOT DETECTED Final   Staphylococcus epidermidis NOT DETECTED NOT DETECTED Final   Staphylococcus lugdunensis NOT DETECTED NOT DETECTED Final   Streptococcus species NOT DETECTED NOT  DETECTED Final   Streptococcus agalactiae NOT DETECTED NOT DETECTED Final   Streptococcus pneumoniae NOT DETECTED NOT DETECTED Final   Streptococcus pyogenes NOT DETECTED NOT DETECTED Final   A.calcoaceticus-baumannii NOT DETECTED NOT DETECTED Final   Bacteroides fragilis NOT DETECTED NOT DETECTED Final   Enterobacterales DETECTED (A) NOT DETECTED Final    Comment: Enterobacterales represent a large order of gram negative bacteria, not a single organism. CRITICAL RESULT CALLED TO, READ BACK BY AND VERIFIED WITH: L POINDEXTER PHAMRD 05/13/20 0405 JDW    Enterobacter cloacae complex NOT DETECTED NOT DETECTED Final   Escherichia coli DETECTED (A) NOT DETECTED Final    Comment: CRITICAL RESULT CALLED TO, READ BACK BY AND VERIFIED WITH: L POINDEXTER PHARMD 05/13/20 0405 JDW    Klebsiella aerogenes NOT DETECTED NOT DETECTED Final   Klebsiella oxytoca NOT DETECTED NOT DETECTED Final   Klebsiella pneumoniae NOT DETECTED NOT DETECTED Final   Proteus species NOT DETECTED NOT DETECTED Final   Salmonella species NOT DETECTED NOT DETECTED Final   Serratia marcescens NOT DETECTED NOT DETECTED Final   Haemophilus influenzae NOT DETECTED NOT DETECTED Final   Neisseria meningitidis NOT DETECTED NOT DETECTED Final   Pseudomonas aeruginosa NOT DETECTED NOT DETECTED Final   Stenotrophomonas maltophilia NOT DETECTED NOT DETECTED Final   Candida albicans NOT DETECTED NOT DETECTED Final   Candida auris NOT DETECTED NOT DETECTED Final   Candida glabrata NOT DETECTED NOT DETECTED Final   Candida krusei NOT DETECTED NOT DETECTED Final   Candida parapsilosis NOT DETECTED NOT DETECTED Final   Candida tropicalis NOT DETECTED NOT DETECTED Final   Cryptococcus neoformans/gattii NOT DETECTED NOT DETECTED Final   CTX-M ESBL NOT DETECTED NOT DETECTED Final   Carbapenem resistance IMP NOT DETECTED NOT DETECTED Final   Carbapenem resistance KPC NOT DETECTED NOT DETECTED Final   Carbapenem resistance NDM NOT DETECTED  NOT DETECTED Final   Carbapenem resist OXA 48 LIKE NOT DETECTED NOT DETECTED Final   Carbapenem resistance VIM NOT DETECTED NOT DETECTED Final    Comment: Performed at [facility], [address] [place], [place] [address]  Gastrointestinal Panel by PCR , Stool     Status: None   Collection Time: 05/13/20 12:48 PM   Specimen: Stool  Result Value Ref Range Status   Campylobacter species NOT DETECTED NOT DETECTED Final   Plesimonas shigelloides NOT DETECTED NOT DETECTED Final   Salmonella species NOT DETECTED NOT DETECTED Final   Yersinia enterocolitica NOT DETECTED NOT DETECTED Final   Vibrio species NOT DETECTED NOT DETECTED Final   Vibrio cholerae NOT DETECTED NOT DETECTED Final   Enteroaggregative E coli (EAEC) NOT DETECTED NOT DETECTED Final   Enteropathogenic E coli (EPEC) NOT DETECTED NOT DETECTED Final   Enterotoxigenic E coli (ETEC) NOT DETECTED NOT DETECTED Final   Shiga like toxin producing E coli (STEC) NOT DETECTED NOT DETECTED Final   Shigella/Enteroinvasive E coli (EIEC) NOT DETECTED NOT DETECTED Final   Cryptosporidium NOT DETECTED NOT DETECTED Final   Cyclospora cayetanensis NOT DETECTED NOT DETECTED Final   Entamoeba histolytica  NOT DETECTED NOT DETECTED Final   Giardia lamblia NOT DETECTED NOT DETECTED Final   Adenovirus F40/41 NOT DETECTED NOT DETECTED Final   Astrovirus NOT DETECTED NOT DETECTED Final   Norovirus GI/GII NOT DETECTED NOT DETECTED Final   Rotavirus A NOT DETECTED NOT DETECTED Final   Sapovirus (I, II, IV, and V) NOT DETECTED NOT DETECTED Final    Comment: Performed at [facility], [place] [place], [place] [address]  Culture, blood (routine x 2)     Status: None (Preliminary result)   Collection Time: 05/13/20  2:53 PM   Specimen: BLOOD  Result Value Ref Range Status   Specimen Description   Final    BLOOD RIGHT ANTECUBITAL Performed at [facility], [place] [address] [place], [place] [address]     Special Requests   Final    BOTTLES DRAWN AEROBIC AND ANAEROBIC Blood Culture adequate volume Performed at [facility] [address], [place], [place] [address]    Culture   Final    NO GROWTH 3 DAYS Performed at [facility], [place] [address], [place], [place] [address]    Report Status PENDING  Incomplete  Culture, blood (routine x 2)     Status: None (Preliminary result)   Collection Time: 05/13/20  2:53 PM   Specimen: BLOOD RIGHT HAND  Result Value Ref Range Status   Specimen Description   Final    BLOOD RIGHT HAND Performed at [facility] [address], [place], [place] [address]    Special Requests   Final    BOTTLES DRAWN AEROBIC AND ANAEROBIC Blood Culture adequate volume Performed at [facility] [address], [place], [place] [address]    Culture   Final    NO GROWTH 3 DAYS Performed at [facility], [place] [address], [place], [place] [address]    Report Status PENDING  Incomplete  C Difficile Quick Screen w PCR reflex     Status: Abnormal   Collection Time: 05/14/20  4:20 PM   Specimen: STOOL  Result Value Ref Range Status   C Diff antigen POSITIVE (A) NEGATIVE Final   C Diff toxin POSITIVE (A) NEGATIVE Final   C Diff interpretation Toxin producing C. difficile detected.  Final    Comment: CRITICAL RESULT CALLED TO, READ BACK BY AND VERIFIED WITH: GREGG,L. RN @1840  ON 03.30.2022 BY COHEN,K Performed at [facility], [place] [address], [place], [place] [address]       Radiology Studies: VAS [place] UPPER EXTREMITY VENOUS DUPLEX  Result Date: 05/16/2020 UPPER VENOUS STUDY  Indications: Swelling, and Recent IV attempted in RUE Risk Factors: Cancer Recently DX'd with Lung CA. Comparison Study: No previous exams Performing Technologist: Rogelia Rohrer  Examination Guidelines: A complete evaluation includes B-mode imaging, spectral Doppler, color Doppler, and power Doppler as needed of all  accessible portions of each vessel. Bilateral testing is considered an integral part of a complete examination. Limited examinations for reoccurring indications may be performed as noted.  Right Findings: +----------+------------+---------+-----------+----------+-------+ RIGHT     CompressiblePhasicitySpontaneousPropertiesSummary +----------+------------+---------+-----------+----------+-------+ IJV           Full       Yes       Yes                      +----------+------------+---------+-----------+----------+-------+ Subclavian    Full       Yes  Yes                      +----------+------------+---------+-----------+----------+-------+ Axillary      Full       Yes       Yes                      +----------+------------+---------+-----------+----------+-------+ Brachial      Full       Yes       Yes                      +----------+------------+---------+-----------+----------+-------+ Radial        Full                                          +----------+------------+---------+-----------+----------+-------+ Ulnar         Full                                          +----------+------------+---------+-----------+----------+-------+ Cephalic      Full                                          +----------+------------+---------+-----------+----------+-------+ Basilic       Full       Yes       Yes                      +----------+------------+---------+-----------+----------+-------+ Subcutaneous edema seen from area of brachial vein into forearm.  Left Findings: +----------+------------+---------+-----------+----------+-------+ LEFT      CompressiblePhasicitySpontaneousPropertiesSummary +----------+------------+---------+-----------+----------+-------+ Subclavian    Full       Yes       Yes                      +----------+------------+---------+-----------+----------+-------+  Summary:  Right: No evidence of deep vein thrombosis  in the upper extremity. No evidence of superficial vein thrombosis in the upper extremity.  Left: No evidence of thrombosis in the subclavian.  *See table(s) above for measurements and observations.  Diagnosing physician: Monica Martinez MD Electronically signed by Monica Martinez MD on 05/16/2020 at 1:45:52 PM.    Final        LOS: 5 days   [facility] Pager on [email or web address]  05/17/2020, 9:45 AM

## 2020-05-17 NOTE — Progress Notes (Signed)
ANTICOAGULATION CONSULT NOTE - Initial Consult  Pharmacy Consult for Lovenox Indication: VTE prophylaxis  No Known Allergies  Patient Measurements: Height: 5' 11.5" (181.6 cm) Weight: 106.1 kg (234 lb) IBW/kg (Calculated) : 76.45  Vital Signs: Temp: 97.7 F (36.5 C) (04/02 0300) Temp Source: Oral (04/02 0300) BP: 123/81 (04/02 0801)  Labs: Recent Labs    05/15/20 0342 05/16/20 0338 05/17/20 0339  HGB 8.2* 8.7* 8.5*  HCT 25.4* 26.9* 27.0*  PLT 112* 129* 129*  CREATININE 0.84 0.75 0.80    Estimated Creatinine Clearance: 125.7 mL/min (by C-G formula based on SCr of 0.8 mg/dL).   Medical History: Past Medical History:  Diagnosis Date  . Dyspnea   . Family history of adverse reaction to anesthesia    brother with seizures had episode under anesthesia.  Patient has seizures as well.  [name] GERD (gastroesophageal reflux disease)   . Rheumatoid aortitis   . Squamous cell lung cancer [facility])    Assessment: Per cardiology note, plan is to transition back to prophylaxis Lovenox after discussions with CCM and the patient. Lovenox 40 mg daily ordered. Agree with 40 mg daily dosing ordered per MD. Though BMI would allow 0.5 mg/kg/day dosing, anemia and bleeding risk argue for more conservative approach.   Goal of Therapy:  Monitor platelets by anticoagulation protocol: Yes   Plan:  Continue with Lovenox 40 mg daily as ordered per MD. Will sign off and follow remotely.   Thank you for allowing pharmacy to participate in this patient's care.    Ulice Dash D 05/17/2020,9:56 AM

## 2020-05-18 DIAGNOSIS — C3491 Malignant neoplasm of unspecified part of right bronchus or lung: Secondary | ICD-10-CM | POA: Diagnosis not present

## 2020-05-18 DIAGNOSIS — I4891 Unspecified atrial fibrillation: Secondary | ICD-10-CM | POA: Diagnosis not present

## 2020-05-18 DIAGNOSIS — A0472 Enterocolitis due to Clostridium difficile, not specified as recurrent: Secondary | ICD-10-CM | POA: Diagnosis not present

## 2020-05-18 DIAGNOSIS — D701 Agranulocytosis secondary to cancer chemotherapy: Secondary | ICD-10-CM | POA: Diagnosis not present

## 2020-05-18 LAB — HEPATIC FUNCTION PANEL
ALT: 15 U/L (ref 0–44)
AST: 20 U/L (ref 15–41)
Albumin: 2.7 g/dL — ABNORMAL LOW (ref 3.5–5.0)
Alkaline Phosphatase: 100 U/L (ref 38–126)
Bilirubin, Direct: 0.2 mg/dL (ref 0.0–0.2)
Indirect Bilirubin: 0.5 mg/dL (ref 0.3–0.9)
Total Bilirubin: 0.7 mg/dL (ref 0.3–1.2)
Total Protein: 5.5 g/dL — ABNORMAL LOW (ref 6.5–8.1)

## 2020-05-18 LAB — CULTURE, BLOOD (ROUTINE X 2)
Culture: NO GROWTH
Culture: NO GROWTH
Special Requests: ADEQUATE
Special Requests: ADEQUATE

## 2020-05-18 LAB — BASIC METABOLIC PANEL
Anion gap: 9 (ref 5–15)
BUN: <5 mg/dL — ABNORMAL LOW (ref 6–20)
CO2: 19 mmol/L — ABNORMAL LOW (ref 22–32)
Calcium: 7.7 mg/dL — ABNORMAL LOW (ref 8.9–10.3)
Chloride: 108 mmol/L (ref 98–111)
Creatinine, Ser: 0.89 mg/dL (ref 0.61–1.24)
GFR, Estimated: >60 mL/min (ref >60)
Glucose, Bld: 105 mg/dL — ABNORMAL HIGH (ref 70–99)
Potassium: 3.5 mmol/L (ref 3.5–5.1)
Sodium: 136 mmol/L (ref 135–145)

## 2020-05-18 LAB — CBC WITH DIFFERENTIAL/PLATELET
Abs Immature Granulocytes: 0.02 10*3/uL (ref 0.00–0.07)
Basophils Absolute: 0 10*3/uL (ref 0.0–0.1)
Basophils Relative: 1 %
Eosinophils Absolute: 0.1 10*3/uL (ref 0.0–0.5)
Eosinophils Relative: 4 %
HCT: 28.8 % — ABNORMAL LOW (ref 39.0–52.0)
Hemoglobin: 9.1 g/dL — ABNORMAL LOW (ref 13.0–17.0)
Immature Granulocytes: 1 %
Lymphocytes Relative: 11 %
Lymphs Abs: 0.3 10*3/uL — ABNORMAL LOW (ref 0.7–4.0)
MCH: 27.1 pg (ref 26.0–34.0)
MCHC: 31.6 g/dL (ref 30.0–36.0)
MCV: 85.7 fL (ref 80.0–100.0)
Monocytes Absolute: 0.4 10*3/uL (ref 0.1–1.0)
Monocytes Relative: 13 %
Neutro Abs: 2 10*3/uL (ref 1.7–7.7)
Neutrophils Relative %: 70 %
Platelets: 140 10*3/uL — ABNORMAL LOW (ref 150–400)
RBC: 3.36 MIL/uL — ABNORMAL LOW (ref 4.22–5.81)
RDW: 15.9 % — ABNORMAL HIGH (ref 11.5–15.5)
WBC: 2.8 10*3/uL — ABNORMAL LOW (ref 4.0–10.5)
nRBC: 0 % (ref 0.0–0.2)

## 2020-05-18 LAB — MAGNESIUM: Magnesium: 1.8 mg/dL (ref 1.7–2.4)

## 2020-05-18 MED ORDER — POTASSIUM CHLORIDE 20 MEQ PO PACK
40.0000 meq | PACK | Freq: Two times a day (BID) | ORAL | Status: AC
  Administered 2020-05-18 (×2): 40 meq via ORAL
  Filled 2020-05-18 (×2): qty 2

## 2020-05-18 MED ORDER — MAGNESIUM SULFATE 2 GM/50ML IV SOLN
2.0000 g | Freq: Once | INTRAVENOUS | Status: AC
  Administered 2020-05-18: 2 g via INTRAVENOUS
  Filled 2020-05-18: qty 50

## 2020-05-18 NOTE — Plan of Care (Signed)
Patient with one small formed stool on 7 a to 7 p shift.

## 2020-05-18 NOTE — Progress Notes (Signed)
[facility] PROGRESS NOTE   John [name] QMG:[id number] DOB: 1960/02/16 DOA: 05/12/2020  PCP: [name], MD  Brief History/Interval Summary: 60 y.o. male with PMH significant for squamous cell carcinoma of lung, rheumatoid arthritis, GERD.  Presented to the emergency department with complaints of generalized weakness.  He was started on systemic chemotherapy recently.  Patient was noted to be hyponatremic and neutropenic.  Had elevated D-dimer.  He was noted to be tachycardic.  Noted to have rapid atrial fibrillation.  He was hospitalized for further management.  Consultants: Medical oncology is following.  Infectious disease.  Cardiology  Procedures:    Transthoracic echocardiogram IMPRESSIONS  1. Left ventricular ejection fraction, by estimation, is 60 to 65%. The left ventricle has normal function. The left ventricle has no regional wall motion abnormalities. Left ventricular diastolic parameters are indeterminate.  2. Right ventricular systolic function is normal. The right ventricular size is normal. Tricuspid regurgitation signal is inadequate for assessing PA pressure.  3. The mitral valve is normal in structure. Trivial mitral valve regurgitation. No evidence of mitral stenosis.  4. The aortic valve is tricuspid. Aortic valve regurgitation is not visualized. No aortic stenosis is present.  5. The inferior vena cava is normal in size with greater than 50% respiratory variability, suggesting right atrial pressure of 3 mmHg.  6. The patient is in atrial fibrillation.  Antibiotics: Anti-infectives (From admission, onward)   Start     Dose/Rate Route Frequency Ordered Stop   05/15/20 1245  fidaxomicin (DIFICID) tablet 200 mg        200 mg Oral 2 times daily 05/15/20 1150 05/25/20 0959   05/15/20 1200  cephALEXin (KEFLEX) capsule 500 mg        500 mg Oral Every 6 hours 05/15/20 0930 05/18/20 2359   05/15/20 1000  vancomycin (VANCOCIN) 50 mg/mL oral solution 125 mg  Status:   Discontinued        125 mg Oral 4 times daily 05/15/20 0644 05/15/20 0645   05/15/20 0800  vancomycin (VANCOCIN) 50 mg/mL oral solution 125 mg  Status:  Discontinued        125 mg Oral 4 times daily 05/15/20 0645 05/15/20 1150   05/13/20 1000  cefTRIAXone (ROCEPHIN) 2 g in sodium chloride 0.9 % 100 mL IVPB  Status:  Discontinued        2 g 200 mL/hr over 30 Minutes Intravenous Every 24 hours 05/13/20 0428 05/15/20 0930   05/12/20 1800  piperacillin-tazobactam (ZOSYN) IVPB 3.375 g  Status:  Discontinued        3.375 g 12.5 mL/hr over 240 Minutes Intravenous Every 8 hours 05/12/20 0909 05/13/20 0428   05/12/20 0945  piperacillin-tazobactam (ZOSYN) IVPB 3.375 g        3.375 g 100 mL/hr over 30 Minutes Intravenous  Once 05/12/20 0858 05/13/20 3267      Subjective/Interval History: Patient mentions that he is feeling much better.  His diarrhea appears to be resolving.  He has not had any bowel movements overnight and only had 2-3 yesterday.  Denies any chest pain or shortness of breath.  No nausea or vomiting.      Assessment/Plan:  Acute C. difficile diarrhea/severe sepsis/febrile neutropenia Patient symptoms started about a week after he received chemotherapy.  He was noted to be neutropenic.  He presented with profuse diarrhea.  GI pathogen panel was unremarkable but C. difficile testing was positive.  This is probably true infection.  Patient was initially started on oral vancomycin.  Changed over to  fidaxomicin by infectious disease.  We will start his 10 days from April 4 when he would complete his course of Keflex. Patient showing improvement in his C. difficile diarrhea.  Has not had any diarrhea overnight.  He only had about 2-3 bowel movements yesterday.  Seems to be improving.  Abdomen is benign.  Neutrophil counts have improved.  E. coli bacteremia Blood cultures were repeated on 3/29 and negative so far.  ID has recommended 7-day course.  Sensitivities reviewed.  Patient was  changed over to oral Keflex.  Today is day 7.     Atrial fibrillation with RVR Patient was requiring Cardizem infusion.  Patient was seen by cardiology.  Patient underwent echocardiogram which showed normal systolic function.  TSH was normal.   He was changed over to oral Cardizem.  However her heart rate noted to be poorly controlled yesterday and so cardiology placed back on intravenous Cardizem.  Cardiology also recommended oral anticoagulation and so patient was initially started on therapeutic Lovenox which was changed over to Eliquis. However further discussions were held between cardiology and the patient on 4/2.  After these discussions Eliquis was discontinued. Patient remains on amiodarone infusion.  Remains in atrial fibrillation.    Right arm swelling He has good radial pulses.  Dopplers negative for DVT.  Most likely due to IV infiltration.  Keep arm elevated.  Right lower lobe pulmonary artery filling defect CT angiogram of the chest showed a intraluminal nonocclusive filling defect which could be secondary to direct invasion of the vessel or an eccentric mural thrombus related to mass.  No primary pulmonary thromboembolism was noted.  Patient's medical oncologist is aware.  Stage IV squamous Cell carcinoma of right lung Diagnosed earlier this year.  Followed by Dr. [name].  Status post chemotherapy last week.  He was also getting radiation treatment and he completed this treatment on 4/1.  Normocytic anemia and thrombocytopenia Most likely secondary to chemotherapeutic effect.  C hemoglobin is stable.  Platelet counts have improved.  Odynophagia Probably related to radiation.  No thrush was noted on exam.  Mouthwash.  History of rheumatoid arthritis On leflunomide as well as Simponi  Hyponatremia secondary to hypovolemia Sodium counts have improved.  Hypokalemia Continue to monitor and replace potassium and magnesium.    GERD Continue PPI.  History of  depression Continue Paxil.  Obesity Estimated body mass index is 32.18 kg/m as calculated from the following:   Height as of this encounter: 5' 11.5" (1.816 m).   Weight as of this encounter: 106.1 kg.    DVT Prophylaxis: Lovenox. Code Status: Full code Family Communication: Discussed with the patient Disposition Plan: Hopefully return home when improved.  Continue to mobilize as heart rate is able to tolerate.  Status is: Inpatient  Remains inpatient appropriate because:IV treatments appropriate due to intensity of illness or inability to take PO and Inpatient level of care appropriate due to severity of illness   Dispo: The patient is from: Home              Anticipated d/c is to: Home              Patient currently is not medically stable to d/c.   Difficult to place patient No       Medications:  Scheduled: . cephALEXin  500 mg Oral Q6H  . enoxaparin (LOVENOX) injection  40 mg Subcutaneous Q24H  . fidaxomicin  200 mg Oral BID  . mouth rinse  15 mL Mouth Rinse BID  .  PARoxetine  10 mg Oral Daily  . potassium chloride  40 mEq Oral BID   Continuous: . sodium chloride 50 mL/hr at 05/17/20 1525  . amiodarone 30 mg/hr (05/17/20 2155)   [facility]:ENIDPOEUMPNTI, ondansetron (ZOFRAN) IV   Objective:  Vital Signs  Vitals:   05/17/20 1351 05/17/20 2027 05/18/20 0542 05/18/20 0825  BP: 111/81 124/64 126/84   Pulse: 83 75 93   Resp: 19 20 20    Temp: 98.2 F (36.8 C) 97.9 F (36.6 C) 98.3 F (36.8 C)   TempSrc: Oral Oral Oral   SpO2: 96% 97% 94% 98%  Weight:      Height:        Intake/Output Summary (Last 24 hours) at 05/18/2020 0936 Last data filed at 05/18/2020 0400 Gross per 24 hour  Intake 2024.06 ml  Output --  Net 2024.06 ml   Filed Weights   05/12/20 0231  Weight: 106.1 kg    General appearance: Awake alert.  In no distress Resp: Clear to auscultation bilaterally.  Normal effort Cardio: S1-S2 is irregularly irregular.  No S3-S4.  No rubs or  bruit. GI: Abdomen is soft.  Nontender nondistended.  Bowel sounds are present normal.  No masses organomegaly Extremities: No edema.  Full range of motion of lower extremities. Neurologic: Alert and oriented x3.  No focal neurological deficits.       Lab Results:  Data Reviewed: I have personally reviewed following labs and imaging studies  CBC: Recent Labs  Lab 05/14/20 0424 05/15/20 0342 05/16/20 0338 05/17/20 0339 05/18/20 0329  WBC 1.8* 3.0* 3.3* 3.0* 2.8*  NEUTROABS 1.2* 2.2 2.4 2.1 2.0  HGB 8.0* 8.2* 8.7* 8.5* 9.1*  HCT 24.4* 25.4* 26.9* 27.0* 28.8*  MCV 82.2 82.5 82.8 84.4 85.7  PLT 90* 112* 129* 129* 140*    Basic Metabolic Panel: Recent Labs  Lab 05/14/20 0424 05/15/20 0342 05/16/20 0338 05/17/20 0339 05/18/20 0329  NA 133* 133* 135 136 136  K 3.2* 3.3* 3.1* 3.7 3.5  CL 100 102 106 107 108  CO2 21* 23 21* 22 19*  GLUCOSE 96 104* 103* 103* 105*  BUN 13 9 6  5* <5*  CREATININE 1.01 0.84 0.75 0.80 0.89  CALCIUM 7.6* 7.7* 7.6* 7.7* 7.7*  MG 1.9 2.0  --  1.8 1.8  PHOS 2.2*  --   --   --   --     GFR: Estimated Creatinine Clearance: 113 mL/min (by C-G formula based on SCr of 0.89 mg/dL).  Liver Function Tests: Recent Labs  Lab 05/12/20 0254 05/18/20 0329  AST 17 20  ALT 14 15  ALKPHOS 82 100  BILITOT 2.8* 0.7  PROT 6.7 5.5*  ALBUMIN 3.2* 2.7*     Coagulation Profile: Recent Labs  Lab 05/12/20 0508  INR 1.1      Recent Results (from the past 240 hour(s))  SARS CORONAVIRUS 2 (TAT 6-24 HRS) Nasopharyngeal Nasopharyngeal Swab     Status: None   Collection Time: 05/12/20  5:09 AM   Specimen: Nasopharyngeal Swab  Result Value Ref Range Status   SARS Coronavirus 2 NEGATIVE NEGATIVE Final    Comment: (NOTE) SARS-CoV-2 target nucleic acids are NOT DETECTED.  The SARS-CoV-2 RNA is generally detectable in upper and lower respiratory specimens during the acute phase of infection. Negative results do not preclude SARS-CoV-2 infection, do not  rule out co-infections with other pathogens, and should not be used as the sole basis for treatment or other patient management decisions. Negative results must be combined with  clinical observations, patient history, and epidemiological information. The expected result is Negative.  Fact Sheet for Patients: SugarRoll.be  Fact Sheet for Healthcare Providers: https://www.woods-mathews.com/  This test is not yet approved or cleared by the [place] FDA and  has been authorized for detection and/or diagnosis of SARS-CoV-2 by FDA under an Emergency Use Authorization (EUA). This EUA will remain  in effect (meaning this test can be used) for the duration of the COVID-19 declaration under Se ction 564(b)(1) of the Act, 21 U.S.C. section 360bbb-3(b)(1), unless the authorization is terminated or revoked sooner.  Performed at [facility], [place] [address], [place], [place] [address]   Culture, blood (x 2)     Status: None   Collection Time: 05/12/20  9:43 AM   Specimen: BLOOD RIGHT HAND  Result Value Ref Range Status   Specimen Description   Final    BLOOD RIGHT HAND Performed at [facility] [address] [place], [place] [address]    Special Requests   Final    BOTTLES DRAWN AEROBIC ONLY Blood Culture adequate volume Performed at [facility] [address] [place], [place] [address]    Culture   Final    NO GROWTH 5 DAYS Performed at [facility], [place] [address], [place], [place] [address]    Report Status 05/17/2020 FINAL  Final  Culture, blood (x 2)     Status: Abnormal   Collection Time: 05/12/20  9:43 AM   Specimen: Right Antecubital; Blood  Result Value Ref Range Status   Specimen Description   Final    RIGHT ANTECUBITAL Performed at [facility] [address], [place], [place] [address]    Special Requests   Final    BOTTLES DRAWN AEROBIC AND  ANAEROBIC Blood Culture adequate volume Performed at [facility] [address] [place], [place] [address]    Culture  Setup Time   Final    IN BOTH AEROBIC AND ANAEROBIC BOTTLES GRAM NEGATIVE RODS Organism ID to follow CRITICAL RESULT CALLED TO, READ BACK BY AND VERIFIED WITH: L POINDEXTER PHARMD 05/13/20 0405 JDW Performed at [facility], [place] [address], [place], [place] [address]    Culture ESCHERICHIA COLI (A)  Final   Report Status 05/15/2020 FINAL  Final   Organism ID, Bacteria ESCHERICHIA COLI  Final      Susceptibility   Escherichia coli - MIC*    AMPICILLIN >=32 RESISTANT Resistant     CEFAZOLIN <=4 SENSITIVE Sensitive     CEFEPIME <=0.12 SENSITIVE Sensitive     CEFTAZIDIME <=1 SENSITIVE Sensitive     CEFTRIAXONE <=0.25 SENSITIVE Sensitive     CIPROFLOXACIN <=0.25 SENSITIVE Sensitive     GENTAMICIN <=1 SENSITIVE Sensitive     IMIPENEM <=0.25 SENSITIVE Sensitive     TRIMETH/SULFA <=20 SENSITIVE Sensitive     AMPICILLIN/SULBACTAM >=32 RESISTANT Resistant     PIP/TAZO <=4 SENSITIVE Sensitive     * ESCHERICHIA COLI  Blood Culture ID Panel (Reflexed)     Status: Abnormal   Collection Time: 05/12/20  9:43 AM  Result Value Ref Range Status   Enterococcus faecalis NOT DETECTED NOT DETECTED Final   Enterococcus Faecium NOT DETECTED NOT DETECTED Final   Listeria monocytogenes NOT DETECTED NOT DETECTED Final   Staphylococcus species NOT DETECTED NOT DETECTED Final   Staphylococcus aureus (BCID) NOT DETECTED NOT DETECTED Final   Staphylococcus epidermidis NOT DETECTED NOT DETECTED Final   Staphylococcus lugdunensis NOT  DETECTED NOT DETECTED Final   Streptococcus species NOT DETECTED NOT DETECTED Final   Streptococcus agalactiae NOT DETECTED NOT DETECTED Final   Streptococcus pneumoniae NOT DETECTED NOT DETECTED Final   Streptococcus pyogenes NOT DETECTED NOT DETECTED Final   A.calcoaceticus-baumannii NOT DETECTED NOT DETECTED Final   Bacteroides  fragilis NOT DETECTED NOT DETECTED Final   Enterobacterales DETECTED (A) NOT DETECTED Final    Comment: Enterobacterales represent a large order of gram negative bacteria, not a single organism. CRITICAL RESULT CALLED TO, READ BACK BY AND VERIFIED WITH: L POINDEXTER PHAMRD 05/13/20 0405 JDW    Enterobacter cloacae complex NOT DETECTED NOT DETECTED Final   Escherichia coli DETECTED (A) NOT DETECTED Final    Comment: CRITICAL RESULT CALLED TO, READ BACK BY AND VERIFIED WITH: L POINDEXTER PHARMD 05/13/20 0405 JDW    Klebsiella aerogenes NOT DETECTED NOT DETECTED Final   Klebsiella oxytoca NOT DETECTED NOT DETECTED Final   Klebsiella pneumoniae NOT DETECTED NOT DETECTED Final   Proteus species NOT DETECTED NOT DETECTED Final   Salmonella species NOT DETECTED NOT DETECTED Final   Serratia marcescens NOT DETECTED NOT DETECTED Final   Haemophilus influenzae NOT DETECTED NOT DETECTED Final   Neisseria meningitidis NOT DETECTED NOT DETECTED Final   Pseudomonas aeruginosa NOT DETECTED NOT DETECTED Final   Stenotrophomonas maltophilia NOT DETECTED NOT DETECTED Final   Candida albicans NOT DETECTED NOT DETECTED Final   Candida auris NOT DETECTED NOT DETECTED Final   Candida glabrata NOT DETECTED NOT DETECTED Final   Candida krusei NOT DETECTED NOT DETECTED Final   Candida parapsilosis NOT DETECTED NOT DETECTED Final   Candida tropicalis NOT DETECTED NOT DETECTED Final   Cryptococcus neoformans/gattii NOT DETECTED NOT DETECTED Final   CTX-M ESBL NOT DETECTED NOT DETECTED Final   Carbapenem resistance IMP NOT DETECTED NOT DETECTED Final   Carbapenem resistance KPC NOT DETECTED NOT DETECTED Final   Carbapenem resistance NDM NOT DETECTED NOT DETECTED Final   Carbapenem resist OXA 48 LIKE NOT DETECTED NOT DETECTED Final   Carbapenem resistance VIM NOT DETECTED NOT DETECTED Final    Comment: Performed at [facility], [address] [place], [place] [address]  Gastrointestinal Panel by PCR ,  Stool     Status: None   Collection Time: 05/13/20 12:48 PM   Specimen: Stool  Result Value Ref Range Status   Campylobacter species NOT DETECTED NOT DETECTED Final   Plesimonas shigelloides NOT DETECTED NOT DETECTED Final   Salmonella species NOT DETECTED NOT DETECTED Final   Yersinia enterocolitica NOT DETECTED NOT DETECTED Final   Vibrio species NOT DETECTED NOT DETECTED Final   Vibrio cholerae NOT DETECTED NOT DETECTED Final   Enteroaggregative E coli (EAEC) NOT DETECTED NOT DETECTED Final   Enteropathogenic E coli (EPEC) NOT DETECTED NOT DETECTED Final   Enterotoxigenic E coli (ETEC) NOT DETECTED NOT DETECTED Final   Shiga like toxin producing E coli (STEC) NOT DETECTED NOT DETECTED Final   Shigella/Enteroinvasive E coli (EIEC) NOT DETECTED NOT DETECTED Final   Cryptosporidium NOT DETECTED NOT DETECTED Final   Cyclospora cayetanensis NOT DETECTED NOT DETECTED Final   Entamoeba histolytica NOT DETECTED NOT DETECTED Final   Giardia lamblia NOT DETECTED NOT DETECTED Final   Adenovirus F40/41 NOT DETECTED NOT DETECTED Final   Astrovirus NOT DETECTED NOT DETECTED Final   Norovirus GI/GII NOT DETECTED NOT DETECTED Final   Rotavirus A NOT DETECTED NOT DETECTED Final   Sapovirus (I, II, IV, and V) NOT DETECTED NOT DETECTED Final  Comment: Performed at [facility], [place] [place], [place] [address]  Culture, blood (routine x 2)     Status: None (Preliminary result)   Collection Time: 05/13/20  2:53 PM   Specimen: BLOOD  Result Value Ref Range Status   Specimen Description   Final    BLOOD RIGHT ANTECUBITAL Performed at [facility] [address], [place], [place] [address]    Special Requests   Final    BOTTLES DRAWN AEROBIC AND ANAEROBIC Blood Culture adequate volume Performed at [facility] [address] [place], [place] [address]    Culture   Final    NO GROWTH 4 DAYS Performed at [facility], [place]  [address] [place], [place] [address]    Report Status PENDING  Incomplete  Culture, blood (routine x 2)     Status: None (Preliminary result)   Collection Time: 05/13/20  2:53 PM   Specimen: BLOOD RIGHT HAND  Result Value Ref Range Status   Specimen Description   Final    BLOOD RIGHT HAND Performed at [facility] [address], [place], [place] [address]    Special Requests   Final    BOTTLES DRAWN AEROBIC AND ANAEROBIC Blood Culture adequate volume Performed at [facility] [address], [place], [place] [address]    Culture   Final    NO GROWTH 4 DAYS Performed at [facility], [place] [address], [place], [place] [address]    Report Status PENDING  Incomplete  C Difficile Quick Screen w PCR reflex     Status: Abnormal   Collection Time: 05/14/20  4:20 PM   Specimen: STOOL  Result Value Ref Range Status   C Diff antigen POSITIVE (A) NEGATIVE Final   C Diff toxin POSITIVE (A) NEGATIVE Final   C Diff interpretation Toxin producing C. difficile detected.  Final    Comment: CRITICAL RESULT CALLED TO, READ BACK BY AND VERIFIED WITH: GREGG,L. RN @1840  ON 03.30.2022 BY COHEN,K Performed at [facility], [place] [address], [place], [place] [address]       Radiology Studies: No results found.     LOS: 6 days   Aletha Allebach [employer] on [email or web address]  05/18/2020, 9:36 AM

## 2020-05-18 NOTE — Progress Notes (Addendum)
Patient is on amiodarone drip, HR- 90-120s. HR goes up to 165-190s with activity and goes back down once pt is settled back in bed. No complaints of chest pain or dizziness. MD Hospitalis/ChargeRN aware.

## 2020-05-18 NOTE — Progress Notes (Signed)
Progress Note  Patient Name: John [name] Date of Encounter: 05/18/2020  [facility] Cardiologist: Fransico Him, MD    Patient Profile     60 y.o. 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 vessel rupture  Inpatient Medications    Scheduled Meds: . cephALEXin  500 mg Oral Q6H  . enoxaparin (LOVENOX) injection  40 mg Subcutaneous Q24H  . fidaxomicin  200 mg Oral BID  . mouth rinse  15 mL Mouth Rinse BID  . PARoxetine  10 mg Oral Daily  . potassium chloride  40 mEq Oral BID   Continuous Infusions: . sodium chloride 50 mL/hr at 05/17/20 1525  . amiodarone 30 mg/hr (05/17/20 2155)   PRN Meds: acetaminophen, ondansetron (ZOFRAN) IV   Vital Signs    Vitals:   05/17/20 1351 05/17/20 2027 05/18/20 0542 05/18/20 0825  BP: 111/81 124/64 126/84   Pulse: 83 75 93   Resp: 19 20 20    Temp: 98.2 F (36.8 C) 97.9 F (36.6 C) 98.3 F (36.8 C)   TempSrc: Oral Oral Oral   SpO2: 96% 97% 94% 98%  Weight:      Height:        Intake/Output Summary (Last 24 hours) at 05/18/2020 0839 Last data filed at 05/18/2020 0400 Gross per 24 hour  Intake 2024.06 ml  Output --  Net 2024.06 ml   Last 3 Weights 05/12/2020 05/06/2020 04/28/2020  Weight (lbs) 234 lb 234 lb 11.2 oz 234 lb 11.2 oz  Weight (kg) 106.142 kg 106.459 kg 106.459 kg      Telemetry    Atrial  fibrillation with rates ranging from 100s-140s- Personally Reviewed  ECG    No new EKG to review - Personally Reviewed  Physical Exam  Well developed and nourished in no acute distress HENT normal Neck supple  Carotids brisk and full without bruits Clear Irregularly irregular rate and rhythm rapid  ventricular response, no murmurs or gallops Abd-soft with active BS without hepatomegaly No Clubbing cyanosis edema Skin-warm and dry A & Oriented  Grossly normal sensory and motor function    Labs    High Sensitivity Troponin:   Recent Labs  Lab 05/12/20 0244 05/12/20 0444 05/12/20 1056 05/12/20 1247  TROPONINIHS 7 7 8 7       Chemistry Recent Labs  Lab 05/12/20 0254 05/12/20 1247 05/16/20 0338 05/17/20 0339 05/18/20 0329  NA  --    < > 135 136 136  K  --    < > 3.1* 3.7 3.5  CL  --    < > 106 107 108  CO2  --    < > 21* 22 19*  GLUCOSE  --    < > 103* 103* 105*  BUN  --    < > 6 5* <5*  CREATININE  --    < > 0.75 0.80 0.89  CALCIUM  --    < > 7.6* 7.7* 7.7*  PROT 6.7  --   --   --  5.5*  ALBUMIN 3.2*  --   --   --  2.7*  AST 17  --   --   --  20  ALT 14  --   --   --  15  ALKPHOS 82  --   --   --  100  BILITOT 2.8*  --   --   --  0.7  GFRNONAA  --    < > >60 >60 >60  ANIONGAP  --    < > 8 7 9    < > = values in this interval not displayed.     Hematology Recent Labs  Lab 05/16/20 0338 05/17/20 0339 05/18/20 0329  WBC 3.3* 3.0* 2.8*  RBC 3.25* 3.20* 3.36*  HGB 8.7* 8.5* 9.1*  HCT 26.9* 27.0* 28.8*  MCV 82.8 84.4 85.7  MCH 26.8 26.6 27.1  MCHC 32.3 31.5 31.6  RDW 14.9 15.4 15.9*  PLT 129* 129* 140*    BNPNo results for input(s): BNP, PROBNP in the last 168 hours.   DDimer  Recent Labs  Lab 05/12/20 0244  DDIMER 2.91*     CHA2DS2-VASc Score = 0  This indicates a 0.2% annual risk of stroke. The patient's score is based upon: CHF History: No HTN History: No Diabetes History: No Stroke History: No Vascular Disease History: No Age  Score: 0 Gender Score: 0       Radiology    No results found.  Cardiac Studies   2D echo 05/12/2020 IMPRESSIONS   1. Left ventricular ejection fraction, by estimation, is 60 to 65%. The  left ventricle has normal function. The left ventricle has no regional  wall motion abnormalities. Left ventricular diastolic parameters are  indeterminate.  2. Right ventricular systolic function is normal. The right ventricular  size is normal. Tricuspid regurgitation signal is inadequate for assessing  PA pressure.  3. The mitral valve is normal in structure. Trivial mitral valve  regurgitation. No evidence of mitral stenosis.  4. The aortic valve is tricuspid. Aortic valve regurgitation is not  visualized. No aortic stenosis is present.  5. The inferior vena cava is normal in size with greater than 50%  respiratory variability, suggesting right atrial pressure of 3 mmHg.  6. The patient is in atrial fibrillation.    Assessment & Plan    1.  Atrial fib, RVR   Atrial fibrillation with a rapid ventricular rate  C. 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  Afterwards he would like to proceed with DVT profilaxis  We will continue the amiodarone and add low-dose metoprolol for rate control  It will be very important tomorrow to have a discussion with oncology with possible pulmonary involvement to try to make sense out of the pathology of the intraluminal mass to help guide decisions regarding anticoagulation etc.   Signed, [name], MD  05/18/2020, 8:39 AM

## 2020-05-19 ENCOUNTER — Other Ambulatory Visit: Payer: Self-pay

## 2020-05-19 ENCOUNTER — Inpatient Hospital Stay: Payer: 59 | Attending: Physician Assistant

## 2020-05-19 ENCOUNTER — Ambulatory Visit: Payer: 59

## 2020-05-19 ENCOUNTER — Other Ambulatory Visit (HOSPITAL_COMMUNITY): Payer: Self-pay

## 2020-05-19 DIAGNOSIS — C3491 Malignant neoplasm of unspecified part of right bronchus or lung: Secondary | ICD-10-CM | POA: Diagnosis not present

## 2020-05-19 DIAGNOSIS — R634 Abnormal weight loss: Secondary | ICD-10-CM | POA: Insufficient documentation

## 2020-05-19 DIAGNOSIS — R531 Weakness: Secondary | ICD-10-CM | POA: Insufficient documentation

## 2020-05-19 DIAGNOSIS — C3411 Malignant neoplasm of upper lobe, right bronchus or lung: Secondary | ICD-10-CM | POA: Insufficient documentation

## 2020-05-19 DIAGNOSIS — R0609 Other forms of dyspnea: Secondary | ICD-10-CM | POA: Insufficient documentation

## 2020-05-19 DIAGNOSIS — R630 Anorexia: Secondary | ICD-10-CM | POA: Insufficient documentation

## 2020-05-19 DIAGNOSIS — G629 Polyneuropathy, unspecified: Secondary | ICD-10-CM | POA: Insufficient documentation

## 2020-05-19 DIAGNOSIS — J439 Emphysema, unspecified: Secondary | ICD-10-CM | POA: Insufficient documentation

## 2020-05-19 DIAGNOSIS — I2699 Other pulmonary embolism without acute cor pulmonale: Secondary | ICD-10-CM | POA: Diagnosis not present

## 2020-05-19 DIAGNOSIS — R5383 Other fatigue: Secondary | ICD-10-CM | POA: Insufficient documentation

## 2020-05-19 DIAGNOSIS — D701 Agranulocytosis secondary to cancer chemotherapy: Secondary | ICD-10-CM | POA: Diagnosis not present

## 2020-05-19 DIAGNOSIS — A4151 Sepsis due to Escherichia coli [E. coli]: Secondary | ICD-10-CM | POA: Diagnosis not present

## 2020-05-19 DIAGNOSIS — R197 Diarrhea, unspecified: Secondary | ICD-10-CM | POA: Insufficient documentation

## 2020-05-19 DIAGNOSIS — Z5112 Encounter for antineoplastic immunotherapy: Secondary | ICD-10-CM | POA: Insufficient documentation

## 2020-05-19 DIAGNOSIS — C7971 Secondary malignant neoplasm of right adrenal gland: Secondary | ICD-10-CM | POA: Insufficient documentation

## 2020-05-19 DIAGNOSIS — I4891 Unspecified atrial fibrillation: Secondary | ICD-10-CM | POA: Diagnosis not present

## 2020-05-19 DIAGNOSIS — Z79899 Other long term (current) drug therapy: Secondary | ICD-10-CM | POA: Insufficient documentation

## 2020-05-19 DIAGNOSIS — M069 Rheumatoid arthritis, unspecified: Secondary | ICD-10-CM | POA: Insufficient documentation

## 2020-05-19 DIAGNOSIS — A0472 Enterocolitis due to Clostridium difficile, not specified as recurrent: Secondary | ICD-10-CM | POA: Diagnosis not present

## 2020-05-19 DIAGNOSIS — I700 Atherosclerosis of aorta: Secondary | ICD-10-CM | POA: Insufficient documentation

## 2020-05-19 LAB — BASIC METABOLIC PANEL
Anion gap: 6 (ref 5–15)
BUN: 6 mg/dL (ref 6–20)
CO2: 23 mmol/L (ref 22–32)
Calcium: 7.8 mg/dL — ABNORMAL LOW (ref 8.9–10.3)
Chloride: 105 mmol/L (ref 98–111)
Creatinine, Ser: 0.76 mg/dL (ref 0.61–1.24)
GFR, Estimated: >60 mL/min (ref >60)
Glucose, Bld: 100 mg/dL — ABNORMAL HIGH (ref 70–99)
Potassium: 3.6 mmol/L (ref 3.5–5.1)
Sodium: 134 mmol/L — ABNORMAL LOW (ref 135–145)

## 2020-05-19 LAB — CBC WITH DIFFERENTIAL/PLATELET
Abs Immature Granulocytes: 0.02 10*3/uL (ref 0.00–0.07)
Basophils Absolute: 0 10*3/uL (ref 0.0–0.1)
Basophils Relative: 1 %
Eosinophils Absolute: 0.1 10*3/uL (ref 0.0–0.5)
Eosinophils Relative: 3 %
HCT: 26.5 % — ABNORMAL LOW (ref 39.0–52.0)
Hemoglobin: 8.5 g/dL — ABNORMAL LOW (ref 13.0–17.0)
Immature Granulocytes: 1 %
Lymphocytes Relative: 14 %
Lymphs Abs: 0.3 10*3/uL — ABNORMAL LOW (ref 0.7–4.0)
MCH: 27.1 pg (ref 26.0–34.0)
MCHC: 32.1 g/dL (ref 30.0–36.0)
MCV: 84.4 fL (ref 80.0–100.0)
Monocytes Absolute: 0.3 10*3/uL (ref 0.1–1.0)
Monocytes Relative: 12 %
Neutro Abs: 1.7 10*3/uL (ref 1.7–7.7)
Neutrophils Relative %: 69 %
Platelets: 143 10*3/uL — ABNORMAL LOW (ref 150–400)
RBC: 3.14 MIL/uL — ABNORMAL LOW (ref 4.22–5.81)
RDW: 16.5 % — ABNORMAL HIGH (ref 11.5–15.5)
WBC: 2.5 10*3/uL — ABNORMAL LOW (ref 4.0–10.5)
nRBC: 0 % (ref 0.0–0.2)

## 2020-05-19 LAB — MAGNESIUM: Magnesium: 1.8 mg/dL (ref 1.7–2.4)

## 2020-05-19 MED ORDER — LEVALBUTEROL TARTRATE 45 MCG/ACT IN AERO
1.0000 | INHALATION_SPRAY | Freq: Four times a day (QID) | RESPIRATORY_TRACT | Status: DC | PRN
  Filled 2020-05-19: qty 15

## 2020-05-19 MED ORDER — MAGNESIUM SULFATE 2 GM/50ML IV SOLN
2.0000 g | Freq: Once | INTRAVENOUS | Status: AC
  Administered 2020-05-19: 2 g via INTRAVENOUS
  Filled 2020-05-19: qty 50

## 2020-05-19 MED ORDER — METOPROLOL TARTRATE 25 MG PO TABS
25.0000 mg | ORAL_TABLET | Freq: Two times a day (BID) | ORAL | Status: DC
  Administered 2020-05-19 – 2020-05-20 (×3): 25 mg via ORAL
  Filled 2020-05-19 (×3): qty 1

## 2020-05-19 MED ORDER — AMIODARONE HCL 200 MG PO TABS
400.0000 mg | ORAL_TABLET | Freq: Two times a day (BID) | ORAL | Status: DC
  Administered 2020-05-19 – 2020-05-20 (×3): 400 mg via ORAL
  Filled 2020-05-19 (×3): qty 2

## 2020-05-19 MED ORDER — POTASSIUM CHLORIDE 20 MEQ PO PACK
40.0000 meq | PACK | Freq: Once | ORAL | Status: AC
  Administered 2020-05-19: 40 meq via ORAL
  Filled 2020-05-19: qty 2

## 2020-05-19 NOTE — Plan of Care (Signed)
  Problem: Education: Goal: Understanding of medication regimen will improve Outcome: Progressing Goal: Individualized Educational Video(s) Outcome: Progressing   Problem: Activity: Goal: Ability to tolerate increased activity will improve Outcome: Progressing   Problem: Cardiac: Goal: Ability to achieve and maintain adequate cardiopulmonary perfusion will improve Outcome: Progressing

## 2020-05-19 NOTE — Progress Notes (Signed)
[facility] PROGRESS NOTE   Zykeem Bauserman YQM:[id number] DOB: July 24, 1960 DOA: 05/12/2020  PCP: [name], MD  Brief History/Interval Summary: 60 y.o. male with PMH significant for squamous cell carcinoma of lung, rheumatoid arthritis, GERD.  Presented to the emergency department with complaints of generalized weakness.  He was started on systemic chemotherapy recently.  Patient was noted to be hyponatremic and neutropenic.  Had elevated D-dimer.  He was noted to be tachycardic.  Noted to have rapid atrial fibrillation.  He was hospitalized for further management.  Consultants: Medical oncology.  Infectious disease.  Cardiology  Procedures:    Transthoracic echocardiogram IMPRESSIONS  1. Left ventricular ejection fraction, by estimation, is 60 to 65%. The left ventricle has normal function. The left ventricle has no regional wall motion abnormalities. Left ventricular diastolic parameters are indeterminate.  2. Right ventricular systolic function is normal. The right ventricular size is normal. Tricuspid regurgitation signal is inadequate for assessing PA pressure.  3. The mitral valve is normal in structure. Trivial mitral valve regurgitation. No evidence of mitral stenosis.  4. The aortic valve is tricuspid. Aortic valve regurgitation is not visualized. No aortic stenosis is present.  5. The inferior vena cava is normal in size with greater than 50% respiratory variability, suggesting right atrial pressure of 3 mmHg.  6. The patient is in atrial fibrillation.  Antibiotics: Anti-infectives (From admission, onward)   Start     Dose/Rate Route Frequency Ordered Stop   05/15/20 1245  fidaxomicin (DIFICID) tablet 200 mg        200 mg Oral 2 times daily 05/15/20 1150 05/25/20 0959   05/15/20 1200  cephALEXin (KEFLEX) capsule 500 mg        500 mg Oral Every 6 hours 05/15/20 0930 05/18/20 1715   05/15/20 1000  vancomycin (VANCOCIN) 50 mg/mL oral solution 125 mg  Status:  Discontinued         125 mg Oral 4 times daily 05/15/20 0644 05/15/20 0645   05/15/20 0800  vancomycin (VANCOCIN) 50 mg/mL oral solution 125 mg  Status:  Discontinued        125 mg Oral 4 times daily 05/15/20 0645 05/15/20 1150   05/13/20 1000  cefTRIAXone (ROCEPHIN) 2 g in sodium chloride 0.9 % 100 mL IVPB  Status:  Discontinued        2 g 200 mL/hr over 30 Minutes Intravenous Every 24 hours 05/13/20 0428 05/15/20 0930   05/12/20 1800  piperacillin-tazobactam (ZOSYN) IVPB 3.375 g  Status:  Discontinued        3.375 g 12.5 mL/hr over 240 Minutes Intravenous Every 8 hours 05/12/20 0909 05/13/20 0428   05/12/20 0945  piperacillin-tazobactam (ZOSYN) IVPB 3.375 g        3.375 g 100 mL/hr over 30 Minutes Intravenous  Once 05/12/20 0858 05/13/20 5284      Subjective/Interval History: Patient mentions that he continues to feel better.  He feels that his heart rate is better.  Denies further episodes of diarrhea no abdominal pain.  Good appetite.  Has been ambulating to the bathroom and back.     Assessment/Plan:  Acute C. difficile diarrhea/severe sepsis/febrile neutropenia Patient symptoms started about a week after he received chemotherapy.  He was noted to be neutropenic.  He presented with profuse diarrhea.  GI pathogen panel was unremarkable but C. difficile testing was positive.  This is probably true infection.  Patient was initially started on oral vancomycin.  Changed over to fidaxomicin by infectious disease.  We will start  his 10 days from April 4 since he completed his course of Keflex on April 3. Patient is diarrhea appears to have resolved.  Abdomen remains benign.  Neutrophil counts have improved.  E. coli bacteremia Blood cultures were repeated on 3/29 and negative so far.  Sensitivities reviewed.  Patient was changed over to oral Keflex and has completed 7-day course as recommended by ID.     Atrial fibrillation with RVR Patient was requiring Cardizem infusion.  Patient was seen by  cardiology.  Patient underwent echocardiogram which showed normal systolic function.  TSH was normal.   He was changed over to oral Cardizem.  However her heart rate noted to be poorly controlled and so cardiology placed back on intravenous Cardizem.  Cardiology also recommended oral anticoagulation and so patient was initially started on therapeutic Lovenox which was changed over to Eliquis. Patient subsequently seen again by cardiology over the weekend and was changed over to amiodarone.  Further discussion was held between cardiology and the patient on 4/2.  Based on these discussions Eliquis was discontinued. Seems to be in sinus rhythm this morning.  Looks like he converted sometime yesterday afternoon.  Further management per cardiology.  Right arm swelling He has good radial pulses.  Dopplers negative for DVT.  Most likely due to IV infiltration.  Keep arm elevated.  Swelling has improved.  Right lower lobe pulmonary artery filling defect CT angiogram of the chest showed a intraluminal nonocclusive filling defect which could be secondary to direct invasion of the vessel or an eccentric mural thrombus related to mass.  No primary pulmonary thromboembolism was noted.  Patient's medical oncologist is aware.  Stage IV squamous Cell carcinoma of right lung Diagnosed earlier this year.  Followed by Dr. [name].  Status post chemotherapy last week.  He was also getting radiation treatment and he completed this treatment on 4/1.  Normocytic anemia and thrombocytopenia Most likely secondary to chemotherapeutic effect.  Hemoglobin is stable for the most part.  Platelet counts have improved.    Odynophagia Probably related to radiation.  No thrush was noted on exam.  Mouthwash.  History of rheumatoid arthritis On leflunomide as well as Simponi.  Hyponatremia secondary to hypovolemia Sodium counts have improved.  Hypokalemia Continue to monitor and replace potassium and magnesium.     GERD Continue PPI.  History of depression Continue Paxil.  Obesity Estimated body mass index is 32.18 kg/m as calculated from the following:   Height as of this encounter: 5' 11.5" (1.816 m).   Weight as of this encounter: 106.1 kg.    DVT Prophylaxis: Lovenox. Code Status: Full code Family Communication: Discussed with the patient Disposition Plan: Hopefully return home when improved.    Status is: Inpatient  Remains inpatient appropriate because:IV treatments appropriate due to intensity of illness or inability to take PO and Inpatient level of care appropriate due to severity of illness   Dispo: The patient is from: Home              Anticipated d/c is to: Home              Patient currently is not medically stable to d/c.   Difficult to place patient No       Medications:  Scheduled: . enoxaparin (LOVENOX) injection  40 mg Subcutaneous Q24H  . fidaxomicin  200 mg Oral BID  . mouth rinse  15 mL Mouth Rinse BID  . PARoxetine  10 mg Oral Daily   Continuous: . amiodarone 30 mg/hr (  05/19/20 0741)   BWI:OMBTDHRCBULAG, ondansetron (ZOFRAN) IV   Objective:  Vital Signs  Vitals:   05/18/20 0825 05/18/20 1228 05/18/20 2127 05/19/20 0602  BP:  119/82 (!) 133/92 (!) 134/92  Pulse:  (!) 101 95 92  Resp:   20 18  Temp:  98.1 F (36.7 C) 98.3 F (36.8 C) 98.1 F (36.7 C)  TempSrc:  Oral Oral Oral  SpO2: 98% 97% 96% 95%  Weight:      Height:        Intake/Output Summary (Last 24 hours) at 05/19/2020 0850 Last data filed at 05/19/2020 0741 Gross per 24 hour  Intake 2158.93 ml  Output --  Net 2158.93 ml   Filed Weights   05/12/20 0231  Weight: 106.1 kg    General appearance: Awake alert.  In no distress Resp: Clear to auscultation bilaterally.  Normal effort Cardio: S1-S2 is normal regular.  No S3-S4.  No rubs murmurs or bruit.  Telemetry shows normal sinus rhythm GI: Abdomen is soft.  Nontender nondistended.  Bowel sounds are present normal.  No  masses organomegaly Extremities: No edema.  Full range of motion of lower extremities. Neurologic: Alert and oriented x3.  No focal neurological deficits.     Lab Results:  Data Reviewed: I have personally reviewed following labs and imaging studies  CBC: Recent Labs  Lab 05/15/20 0342 05/16/20 0338 05/17/20 0339 05/18/20 0329 05/19/20 0328  WBC 3.0* 3.3* 3.0* 2.8* 2.5*  NEUTROABS 2.2 2.4 2.1 2.0 1.7  HGB 8.2* 8.7* 8.5* 9.1* 8.5*  HCT 25.4* 26.9* 27.0* 28.8* 26.5*  MCV 82.5 82.8 84.4 85.7 84.4  PLT 112* 129* 129* 140* 143*    Basic Metabolic Panel: Recent Labs  Lab 05/14/20 0424 05/15/20 0342 05/16/20 0338 05/17/20 0339 05/18/20 0329 05/19/20 0328  NA 133* 133* 135 136 136 134*  K 3.2* 3.3* 3.1* 3.7 3.5 3.6  CL 100 102 106 107 108 105  CO2 21* 23 21* 22 19* 23  GLUCOSE 96 104* 103* 103* 105* 100*  BUN 13 9 6  5* <5* 6  CREATININE 1.01 0.84 0.75 0.80 0.89 0.76  CALCIUM 7.6* 7.7* 7.6* 7.7* 7.7* 7.8*  MG 1.9 2.0  --  1.8 1.8 1.8  PHOS 2.2*  --   --   --   --   --     GFR: Estimated Creatinine Clearance: 125.7 mL/min (by C-G formula based on SCr of 0.76 mg/dL).  Liver Function Tests: Recent Labs  Lab 05/18/20 0329  AST 20  ALT 15  ALKPHOS 100  BILITOT 0.7  PROT 5.5*  ALBUMIN 2.7*     Recent Results (from the past 240 hour(s))  SARS CORONAVIRUS 2 (TAT 6-24 HRS) Nasopharyngeal Nasopharyngeal Swab     Status: None   Collection Time: 05/12/20  5:09 AM   Specimen: Nasopharyngeal Swab  Result Value Ref Range Status   SARS Coronavirus 2 NEGATIVE NEGATIVE Final    Comment: (NOTE) SARS-CoV-2 target nucleic acids are NOT DETECTED.  The SARS-CoV-2 RNA is generally detectable in upper and lower respiratory specimens during the acute phase of infection. Negative results do not preclude SARS-CoV-2 infection, do not rule out co-infections with other pathogens, and should not be used as the sole basis for treatment or other patient management  decisions. Negative results must be combined with clinical observations, patient history, and epidemiological information. The expected result is Negative.  Fact Sheet for Patients: SugarRoll.be  Fact Sheet for Healthcare Providers: https://www.woods-mathews.com/  This test is not yet approved or  cleared by the [place] and  has been authorized for detection and/or diagnosis of SARS-CoV-2 by FDA under an Emergency Use Authorization (EUA). This EUA will remain  in effect (meaning this test can be used) for the duration of the COVID-19 declaration under Se ction 564(b)(1) of the Act, 21 U.S.C. section 360bbb-3(b)(1), unless the authorization is terminated or revoked sooner.  Performed at [name], [place] [address] [place], [place] [address]   Culture, blood (x 2)     Status: None   Collection Time: 05/12/20  9:43 AM   Specimen: BLOOD RIGHT HAND  Result Value Ref Range Status   Specimen Description   Final    BLOOD RIGHT HAND Performed at [facility] [address], [place], [place] [address]    Special Requests   Final    BOTTLES DRAWN AEROBIC ONLY Blood Culture adequate volume Performed at [facility] [address], [place], [place] [address]    Culture   Final    NO GROWTH 5 DAYS Performed at [facility], [place] [address] [place], [place] [address]    Report Status 05/17/2020 FINAL  Final  Culture, blood (x 2)     Status: Abnormal   Collection Time: 05/12/20  9:43 AM   Specimen: Right Antecubital; Blood  Result Value Ref Range Status   Specimen Description   Final    RIGHT ANTECUBITAL Performed at [facility] [address] [place], [place] [address]    Special Requests   Final    BOTTLES DRAWN AEROBIC AND ANAEROBIC Blood Culture adequate volume Performed at [facility] [address] [place], [place] [address]     Culture  Setup Time   Final    IN BOTH AEROBIC AND ANAEROBIC BOTTLES GRAM NEGATIVE RODS Organism ID to follow CRITICAL RESULT CALLED TO, READ BACK BY AND VERIFIED WITH: L POINDEXTER PHARMD 05/13/20 0405 JDW Performed at [facility], [place] [address], [place], [place] [address]    Culture ESCHERICHIA COLI (A)  Final   Report Status 05/15/2020 FINAL  Final   Organism ID, Bacteria ESCHERICHIA COLI  Final      Susceptibility   Escherichia coli - MIC*    AMPICILLIN >=32 RESISTANT Resistant     CEFAZOLIN <=4 SENSITIVE Sensitive     CEFEPIME <=0.12 SENSITIVE Sensitive     CEFTAZIDIME <=1 SENSITIVE Sensitive     CEFTRIAXONE <=0.25 SENSITIVE Sensitive     CIPROFLOXACIN <=0.25 SENSITIVE Sensitive     GENTAMICIN <=1 SENSITIVE Sensitive     IMIPENEM <=0.25 SENSITIVE Sensitive     TRIMETH/SULFA <=20 SENSITIVE Sensitive     AMPICILLIN/SULBACTAM >=32 RESISTANT Resistant     PIP/TAZO <=4 SENSITIVE Sensitive     * ESCHERICHIA COLI  Blood Culture ID Panel (Reflexed)     Status: Abnormal   Collection Time: 05/12/20  9:43 AM  Result Value Ref Range Status   Enterococcus faecalis NOT DETECTED NOT DETECTED Final   Enterococcus Faecium NOT DETECTED NOT DETECTED Final   Listeria monocytogenes NOT DETECTED NOT DETECTED Final   Staphylococcus species NOT DETECTED NOT DETECTED Final   Staphylococcus aureus (BCID) NOT DETECTED NOT DETECTED Final   Staphylococcus epidermidis NOT DETECTED NOT DETECTED Final   Staphylococcus lugdunensis NOT DETECTED NOT DETECTED Final   Streptococcus species NOT DETECTED NOT DETECTED Final   Streptococcus agalactiae NOT DETECTED NOT DETECTED Final   Streptococcus pneumoniae NOT DETECTED NOT DETECTED Final  Streptococcus pyogenes NOT DETECTED NOT DETECTED Final   A.calcoaceticus-baumannii NOT DETECTED NOT DETECTED Final   Bacteroides fragilis NOT DETECTED NOT DETECTED Final   Enterobacterales DETECTED (A) NOT DETECTED Final    Comment: Enterobacterales represent a large  order of gram negative bacteria, not a single organism. CRITICAL RESULT CALLED TO, READ BACK BY AND VERIFIED WITH: L POINDEXTER PHAMRD 05/13/20 0405 JDW    Enterobacter cloacae complex NOT DETECTED NOT DETECTED Final   Escherichia coli DETECTED (A) NOT DETECTED Final    Comment: CRITICAL RESULT CALLED TO, READ BACK BY AND VERIFIED WITH: L POINDEXTER PHARMD 05/13/20 0405 JDW    Klebsiella aerogenes NOT DETECTED NOT DETECTED Final   Klebsiella oxytoca NOT DETECTED NOT DETECTED Final   Klebsiella pneumoniae NOT DETECTED NOT DETECTED Final   Proteus species NOT DETECTED NOT DETECTED Final   Salmonella species NOT DETECTED NOT DETECTED Final   Serratia marcescens NOT DETECTED NOT DETECTED Final   Haemophilus influenzae NOT DETECTED NOT DETECTED Final   Neisseria meningitidis NOT DETECTED NOT DETECTED Final   Pseudomonas aeruginosa NOT DETECTED NOT DETECTED Final   Stenotrophomonas maltophilia NOT DETECTED NOT DETECTED Final   Candida albicans NOT DETECTED NOT DETECTED Final   Candida auris NOT DETECTED NOT DETECTED Final   Candida glabrata NOT DETECTED NOT DETECTED Final   Candida krusei NOT DETECTED NOT DETECTED Final   Candida parapsilosis NOT DETECTED NOT DETECTED Final   Candida tropicalis NOT DETECTED NOT DETECTED Final   Cryptococcus neoformans/gattii NOT DETECTED NOT DETECTED Final   CTX-M ESBL NOT DETECTED NOT DETECTED Final   Carbapenem resistance IMP NOT DETECTED NOT DETECTED Final   Carbapenem resistance KPC NOT DETECTED NOT DETECTED Final   Carbapenem resistance NDM NOT DETECTED NOT DETECTED Final   Carbapenem resist OXA 48 LIKE NOT DETECTED NOT DETECTED Final   Carbapenem resistance VIM NOT DETECTED NOT DETECTED Final    Comment: Performed at [facility], [address] [place], [place] [address]  Gastrointestinal Panel by PCR , Stool     Status: None   Collection Time: 05/13/20 12:48 PM   Specimen: Stool  Result Value Ref Range Status   Campylobacter species NOT  DETECTED NOT DETECTED Final   Plesimonas shigelloides NOT DETECTED NOT DETECTED Final   Salmonella species NOT DETECTED NOT DETECTED Final   Yersinia enterocolitica NOT DETECTED NOT DETECTED Final   Vibrio species NOT DETECTED NOT DETECTED Final   Vibrio cholerae NOT DETECTED NOT DETECTED Final   Enteroaggregative E coli (EAEC) NOT DETECTED NOT DETECTED Final   Enteropathogenic E coli (EPEC) NOT DETECTED NOT DETECTED Final   Enterotoxigenic E coli (ETEC) NOT DETECTED NOT DETECTED Final   Shiga like toxin producing E coli (STEC) NOT DETECTED NOT DETECTED Final   Shigella/Enteroinvasive E coli (EIEC) NOT DETECTED NOT DETECTED Final   Cryptosporidium NOT DETECTED NOT DETECTED Final   Cyclospora cayetanensis NOT DETECTED NOT DETECTED Final   Entamoeba histolytica NOT DETECTED NOT DETECTED Final   Giardia lamblia NOT DETECTED NOT DETECTED Final   Adenovirus F40/41 NOT DETECTED NOT DETECTED Final   Astrovirus NOT DETECTED NOT DETECTED Final   Norovirus GI/GII NOT DETECTED NOT DETECTED Final   Rotavirus A NOT DETECTED NOT DETECTED Final   Sapovirus (I, II, IV, and V) NOT DETECTED NOT DETECTED Final    Comment: Performed at [facility], [place] [place], [place] [address]  Culture, blood (routine x 2)     Status: None   Collection Time: 05/13/20  2:53 PM  Specimen: BLOOD  Result Value Ref Range Status   Specimen Description   Final    BLOOD RIGHT ANTECUBITAL Performed at [facility] [address], [place], [place] [address]    Special Requests   Final    BOTTLES DRAWN AEROBIC AND ANAEROBIC Blood Culture adequate volume Performed at [facility] [address], [place], [place] [address]    Culture   Final    NO GROWTH 5 DAYS Performed at [facility], [place] [address] [place], [place] [address]    Report Status 05/18/2020 FINAL  Final  Culture, blood (routine x 2)     Status: None   Collection Time: 05/13/20  2:53 PM    Specimen: BLOOD RIGHT HAND  Result Value Ref Range Status   Specimen Description   Final    BLOOD RIGHT HAND Performed at [facility] [address], [place], [place] [address]    Special Requests   Final    BOTTLES DRAWN AEROBIC AND ANAEROBIC Blood Culture adequate volume Performed at [facility] [address] [place], [place] [address]    Culture   Final    NO GROWTH 5 DAYS Performed at [facility], [place] [address], [place], [place] [address]    Report Status 05/18/2020 FINAL  Final  C Difficile Quick Screen w PCR reflex     Status: Abnormal   Collection Time: 05/14/20  4:20 PM   Specimen: STOOL  Result Value Ref Range Status   C Diff antigen POSITIVE (A) NEGATIVE Final   C Diff toxin POSITIVE (A) NEGATIVE Final   C Diff interpretation Toxin producing C. difficile detected.  Final    Comment: CRITICAL RESULT CALLED TO, READ BACK BY AND VERIFIED WITH: GREGG,L. RN @1840  ON 03.30.2022 BY COHEN,K Performed at [facility], [place] [address] [place], [place] [address]       Radiology Studies: No results found.     LOS: 7 days   Jaisen Wiltrout [employer] on [email or web address]  05/19/2020, 8:50 AM

## 2020-05-19 NOTE — Progress Notes (Addendum)
Progress Note  Patient Name: John [name] Date of Encounter: 05/19/2020  [facility] Cardiologist: Fransico Him, MD   Subjective   He feels well from a cardiac perspective, but does report new cough and wheezing this morning.   Inpatient Medications    Scheduled Meds: . enoxaparin (LOVENOX) injection  40 mg Subcutaneous Q24H  . fidaxomicin  200 mg Oral BID  . mouth rinse  15 mL Mouth Rinse BID  . PARoxetine  10 mg Oral Daily   Continuous Infusions: . amiodarone 30 mg/hr (05/19/20 0741)   PRN Meds: acetaminophen, ondansetron (ZOFRAN) IV   Vital Signs    Vitals:   05/18/20 0825 05/18/20 1228 05/18/20 2127 05/19/20 0602  BP:  119/82 (!) 133/92 (!) 134/92  Pulse:  (!) 101 95 92  Resp:   20 18  Temp:  98.1 F (36.7 C) 98.3 F (36.8 C) 98.1 F (36.7 C)  TempSrc:  Oral Oral Oral  SpO2: 98% 97% 96% 95%  Weight:      Height:        Intake/Output Summary (Last 24 hours) at 05/19/2020 0818 Last data filed at 05/19/2020 0741 Gross per 24 hour  Intake 2158.93 ml  Output --  Net 2158.93 ml   Last 3 Weights 05/12/2020 05/06/2020 04/28/2020  Weight (lbs) 234 lb 234 lb 11.2 oz 234 lb 11.2 oz  Weight (kg) 106.142 kg 106.459 kg 106.459 kg      Telemetry    Appears to be maintaining sinus rhythm with HR in the 90s - Personally Reviewed  ECG    Pending new tracing - Personally Reviewed  Physical Exam   GEN: No acute distress.   Neck: No JVD Cardiac: RRR, no murmurs, rubs, or gallops.  Respiratory: wheezing throughout, nonproductive cough GI: Soft, nontender, non-distended  MS: No edema; No deformity. Neuro:  Nonfocal  Psych: Normal affect   Labs    High Sensitivity Troponin:   Recent Labs  Lab 05/12/20 0244 05/12/20 0444 05/12/20 1056 05/12/20 1247  TROPONINIHS 7 7 8 7       Chemistry Recent Labs  Lab 05/17/20 0339 05/18/20 0329 05/19/20 0328  NA 136 136 134*  K 3.7 3.5 3.6  CL 107 108 105  CO2 22 19* 23  GLUCOSE 103* 105* 100*  BUN 5* <5* 6   CREATININE 0.80 0.89 0.76  CALCIUM 7.7* 7.7* 7.8*  PROT  --  5.5*  --   ALBUMIN  --  2.7*  --   AST  --  20  --   ALT  --  15  --   ALKPHOS  --  100  --   BILITOT  --  0.7  --   GFRNONAA >60 >60 >60  ANIONGAP 7 9 6      Hematology Recent Labs  Lab 05/17/20 0339 05/18/20 0329 05/19/20 0328  WBC 3.0* 2.8* 2.5*  RBC 3.20* 3.36* 3.14*  HGB 8.5* 9.1* 8.5*  HCT 27.0* 28.8* 26.5*  MCV 84.4 85.7 84.4  MCH 26.6 27.1 27.1  MCHC 31.5 31.6 32.1  RDW 15.4 15.9* 16.5*  PLT 129* 140* 143*    BNPNo results for input(s): BNP, PROBNP in the last 168 hours.   DDimer No results for input(s): DDIMER in the last 168 hours.   Radiology    No results found.  Cardiac Studies   2D echo 05/12/2020 IMPRESSIONS   1. Left ventricular ejection fraction, by estimation, is 60 to 65%. The  left ventricle has normal function. The left ventricle has no  regional  wall motion abnormalities. Left ventricular diastolic parameters are  indeterminate.  2. Right ventricular systolic function is normal. The right ventricular  size is normal. Tricuspid regurgitation signal is inadequate for assessing  PA pressure.  3. The mitral valve is normal in structure. Trivial mitral valve  regurgitation. No evidence of mitral stenosis.  4. The aortic valve is tricuspid. Aortic valve regurgitation is not  visualized. No aortic stenosis is present.  5. The inferior vena cava is normal in size with greater than 50%  respiratory variability, suggesting right atrial pressure of 3 mmHg.  6. The patient is in atrial fibrillation.   Patient Profile     60 y.o. 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  Assessment & Plan    Atrial fibrillation with RVR - HR in the 190s at admission, improved with IVF and IV cardizem - HR increased following transition to PO cardizem - question continued dehydration in the  setting of ongoing diarrhea vs poor PO absorption  - switched back to IV cardizem while he is still having diarrhea - but discontinued on 05/16/20 due to poor rate control - now on amiodarone gtt and PO lopressor with better rates - appears to be in sinus  - awaiting repeat EKG  - transition IV amiodarone to PO dosing: 400 mg BID x 7 days, then 200 mg daily thereafter - will add lopressor for additional rate control   Need for chronic anticoagulation - oncology agreed with anticoagulation for now - he does have a lung mass with possible invasion into the PA which increases bleeding risk on [place] This patients CHA2DS2-VASc Score and unadjusted Ischemic Stroke Rate (% per year) is equal to 0.2 % stroke rate/year from a score of 0 - question intraluminal thrombus vs invasive tumor - Dr. Caryl Comes discussed risks and benefits of anticoagulation in the setting of both scenarios and the patient has opted against anticoagulation at this time - he is now on DVT PPX with lovenox   Wheezing - will order xopenex - avoid albuterol given Afib RVR - asked him to sit up in bed as much as possible - difficulty getting comfortable   Orthostatic hypotension - orthostatics positive on admission - suspect this was related to dehydration and diarrhea with chemo, and now with D diff infection - suggested compression hose   Hypokalemia - suspect related to diarrhea - K 3.6 - would like to keep above 4.0 and Mg > 2.0   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  Agree  with assessment and plan as noted above.  Changes made to the above note as needed.  Patient seen and independently examined with Doreene Adas, PA .   We discussed all aspects of the encounter. I agree with the assessment and plan as stated above.  1.    Atrial fib with RVR:    He is back in sinus rhythm Contine amio  I agree that it appears that his lung mass is pressing agains / possibly has eroded into the PA . I think anticoagulation at this point would be very risky.    HR is still fast - may be related to all of his underlying issues.  Will add metoprolol 25 BID. We can titrate / taper as needed    I have spent a total of 40 minutes with patient reviewing hospital  notes , telemetry, EKGs, labs and examining patient as well as establishing an assessment and plan that was discussed with the patient. > 50% of time was spent in direct patient care.    Thayer Headings, Brooke Bonito., MD, [facility] 05/19/2020, 12:03 PM [address],  [place] Pager [phone]

## 2020-05-19 NOTE — Progress Notes (Addendum)
Physical Therapy Treatment and Discharge  Patient Details Name: John [name] MRN: [id number] DOB: 04-27-1960 Today's Date: 05/19/2020    History of Present Illness 60 yo male admitted with Afib with RVR, weakness, neutropenia on 05/12/20. Hx of stage IV lung ca    PT Comments    Pt has met goals and has no further therapy needs.  He demonstrates safe independent gait and balance. Pt has been ambulating in room independently and demonstrates safe dynamic balance.  HR does increase to 135 bpm with activity but quickly returned to resting levels.  No episodes of dizziness. No further PT indicated.    Follow Up Recommendations  No PT follow up     Equipment Recommendations  None recommended by PT    Recommendations for Other Services       Precautions / Restrictions Precautions Precautions: Other (comment) Precaution Comments: monitor HR    Mobility  Bed Mobility Overal bed mobility: Independent                  Transfers Overall transfer level: Independent               General transfer comment: Has been ambulating in room independently; demonstrated safely with therapy  Ambulation/Gait Ambulation/Gait assistance: Supervision;Independent Gait Distance (Feet): 400 Feet Assistive device: None Gait Pattern/deviations: WFL(Within Functional Limits) Gait velocity: normal   General Gait Details: Has been ambulating in room independently but had supervision with therapy; demonstrated safely with therapy; HR up to 135 bpm but pt reports feeling ok, mild fatigue   Stairs             Wheelchair Mobility    Modified Rankin (Stroke Patients Only)       Balance Overall balance assessment: Independent   Sitting balance-Leahy Scale: Normal       Standing balance-Leahy Scale: Normal Standing balance comment: Pt ambulating in room, reaching things on shelves, leaning outside BOS to straighten bed                            Cognition  Arousal/Alertness: Awake/alert Behavior During Therapy: WFL for tasks assessed/performed Overall Cognitive Status: Within Functional Limits for tasks assessed                                        Exercises      General Comments General comments (skin integrity, edema, etc.): HR up to 135 bpm with activity quickly returning to 100 bpm.  Educated on no further therapy needs.  Discussed gradual increase in endurance with several frequent short walks daily.      Pertinent Vitals/Pain Pain Assessment: No/denies pain    Home Living                      Prior Function            PT Goals (current goals can now be found in the care plan section) Acute Rehab PT Goals PT Goal Formulation: All assessment and education complete, DC therapy Progress towards PT goals: Goals met/education completed, patient discharged from PT    Frequency           PT Plan Other (comment) (no further therapy needs)    Co-evaluation              AM-PAC PT "6 Clicks" Mobility   Outcome Measure  Help needed turning from your back to your side while in a flat bed without using bedrails?: None Help needed moving from lying on your back to sitting on the side of a flat bed without using bedrails?: None Help needed moving to and from a bed to a chair (including a wheelchair)?: None Help needed standing up from a chair using your arms (e.g., wheelchair or bedside chair)?: None Help needed to walk in hospital room?: None Help needed climbing 3-5 steps with a railing? : None 6 Click Score: 24    End of Session   Activity Tolerance: Patient tolerated treatment well Patient left: in bed;with call bell/phone within reach Nurse Communication: Mobility status       Time: 8325-4982 PT Time Calculation (min) (ACUTE ONLY): 10 min  Charges:  $Gait Training: 8-22 mins                     Abran Richard, PT Acute Rehab Services Pager [phone] [facility]  [place] 05/19/2020, 1:42 PM

## 2020-05-20 ENCOUNTER — Ambulatory Visit: Payer: 59

## 2020-05-20 ENCOUNTER — Encounter: Payer: Self-pay | Admitting: Medical Oncology

## 2020-05-20 DIAGNOSIS — I4891 Unspecified atrial fibrillation: Secondary | ICD-10-CM | POA: Diagnosis not present

## 2020-05-20 LAB — BASIC METABOLIC PANEL
Anion gap: 7 (ref 5–15)
BUN: 8 mg/dL (ref 6–20)
CO2: 22 mmol/L (ref 22–32)
Calcium: 8.1 mg/dL — ABNORMAL LOW (ref 8.9–10.3)
Chloride: 103 mmol/L (ref 98–111)
Creatinine, Ser: 0.76 mg/dL (ref 0.61–1.24)
GFR, Estimated: >60 mL/min (ref >60)
Glucose, Bld: 96 mg/dL (ref 70–99)
Potassium: 3.9 mmol/L (ref 3.5–5.1)
Sodium: 132 mmol/L — ABNORMAL LOW (ref 135–145)

## 2020-05-20 MED ORDER — FIDAXOMICIN 200 MG PO TABS: 200.0000 mg | ORAL_TABLET | Freq: Two times a day (BID) | ORAL | 0 refills | Status: AC

## 2020-05-20 MED ORDER — AMIODARONE HCL 200 MG PO TABS: ORAL_TABLET | ORAL | 1 refills | Status: DC

## 2020-05-20 MED ORDER — METOPROLOL TARTRATE 25 MG PO TABS: 25.0000 mg | ORAL_TABLET | Freq: Two times a day (BID) | ORAL | 1 refills | Status: DC

## 2020-05-20 MED ORDER — LEFLUNOMIDE 20 MG PO TABS: 20.0000 mg | ORAL_TABLET | Freq: Every day | ORAL | Status: DC

## 2020-05-20 MED ORDER — POTASSIUM CHLORIDE 20 MEQ PO PACK
40.0000 meq | PACK | Freq: Once | ORAL | Status: AC
  Administered 2020-05-20: 40 meq via ORAL
  Filled 2020-05-20: qty 2

## 2020-05-20 MED ORDER — SIMPONI 50 MG/0.5ML ~~LOC~~ SOAJ: 50.0000 mg | SUBCUTANEOUS | Status: DC

## 2020-05-20 NOTE — TOC Transition Note (Signed)
Transition of Care [facility]) - CM/SW Discharge Note   Patient Details  Name: John [name] MRN: [id number] Date of Birth: [birth year]  [facility] CM/SW Contact:  Ross Ludwig, LCSW Phone Number: 05/20/2020, 1:49 PM   Clinical Narrative:     Patient discharging back home, he does not need any home health anymore.  CSW updated [facility].  Final next level of care: Home/Self Care Barriers to Discharge: Barriers Resolved   Patient Goals and CMS Choice Patient states their goals for this hospitalization and ongoing recovery are:: To return back home. CMS Medicare.gov Compare Post Acute Care list provided to:: Patient Choice offered to / list presented to : Patient  Discharge Placement  Patient to discharge home no needs.             Discharge Plan and Services                                     Social Determinants of Health (SDOH) Interventions     Readmission Risk Interventions No flowsheet data found.

## 2020-05-20 NOTE — Plan of Care (Signed)
  Problem: Education: Goal: Understanding of medication regimen will improve Outcome: Progressing   

## 2020-05-20 NOTE — Progress Notes (Addendum)
Progress Note  Patient Name: John [name] Date of Encounter: 05/20/2020  [facility] Cardiologist: Fransico Him, MD   Subjective   Pt feels well and is anxious to discharge home. He denies palpitations. We discussed low sodium diet, he just wants a donut.  Inpatient Medications    Scheduled Meds: . amiodarone  400 mg Oral BID  . enoxaparin (LOVENOX) injection  40 mg Subcutaneous Q24H  . fidaxomicin  200 mg Oral BID  . mouth rinse  15 mL Mouth Rinse BID  . metoprolol tartrate  25 mg Oral BID  . PARoxetine  10 mg Oral Daily   Continuous Infusions:  PRN Meds: acetaminophen, levalbuterol, ondansetron (ZOFRAN) IV   Vital Signs    Vitals:   05/19/20 0602 05/19/20 1304 05/19/20 2019 05/20/20 0439  BP: (!) 134/92 126/83 131/89 121/81  Pulse: 92 (!) 102 90 87  Resp: 18 18 19 18   Temp: 98.1 F (36.7 C) 97.8 F (36.6 C) 98.4 F (36.9 C) 98.3 F (36.8 C)  TempSrc: Oral Oral Oral Oral  SpO2: 95% 97% 95% 96%  Weight:      Height:        Intake/Output Summary (Last 24 hours) at 05/20/2020 0939 Last data filed at 05/19/2020 2032 Gross per 24 hour  Intake 300 ml  Output --  Net 300 ml   Last 3 Weights 05/12/2020 05/06/2020 04/28/2020  Weight (lbs) 234 lb 234 lb 11.2 oz 234 lb 11.2 oz  Weight (kg) 106.142 kg 106.459 kg 106.459 kg      Telemetry    Sinus rhythm with HR in the 80-90s - Personally Reviewed  ECG    No new tracings - Personally Reviewed  Physical Exam   GEN: No acute distress.   Neck: No JVD Cardiac: RRR, no murmurs, rubs, or gallops.  Respiratory: Clear to auscultation bilaterally. GI: Soft, nontender, non-distended  MS: No edema; No deformity. Neuro:  Nonfocal  Psych: Normal affect   Labs    High Sensitivity Troponin:   Recent Labs  Lab 05/12/20 0244 05/12/20 0444 05/12/20 1056 05/12/20 1247  TROPONINIHS 7 7 8 7       Chemistry Recent Labs  Lab 05/18/20 0329 05/19/20 0328 05/20/20 0431  NA 136 134* 132*  K 3.5 3.6 3.9  CL 108  105 103  CO2 19* 23 22  GLUCOSE 105* 100* 96  BUN <5* 6 8  CREATININE 0.89 0.76 0.76  CALCIUM 7.7* 7.8* 8.1*  PROT 5.5*  --   --   ALBUMIN 2.7*  --   --   AST 20  --   --   ALT 15  --   --   ALKPHOS 100  --   --   BILITOT 0.7  --   --   GFRNONAA >60 >60 >60  ANIONGAP 9 6 7      Hematology Recent Labs  Lab 05/17/20 0339 05/18/20 0329 05/19/20 0328  WBC 3.0* 2.8* 2.5*  RBC 3.20* 3.36* 3.14*  HGB 8.5* 9.1* 8.5*  HCT 27.0* 28.8* 26.5*  MCV 84.4 85.7 84.4  MCH 26.6 27.1 27.1  MCHC 31.5 31.6 32.1  RDW 15.4 15.9* 16.5*  PLT 129* 140* 143*    BNPNo results for input(s): BNP, PROBNP in the last 168 hours.   DDimer No results for input(s): DDIMER in the last 168 hours.   Radiology    No results found.  Cardiac Studies   2D echo 05/12/2020 IMPRESSIONS   1. Left ventricular ejection fraction, by estimation, is 60  to 65%. The  left ventricle has normal function. The left ventricle has no regional  wall motion abnormalities. Left ventricular diastolic parameters are  indeterminate.  2. Right ventricular systolic function is normal. The right ventricular  size is normal. Tricuspid regurgitation signal is inadequate for assessing  PA pressure.  3. The mitral valve is normal in structure. Trivial mitral valve  regurgitation. No evidence of mitral stenosis.  4. The aortic valve is tricuspid. Aortic valve regurgitation is not  visualized. No aortic stenosis is present.  5. The inferior vena cava is normal in size with greater than 50%  respiratory variability, suggesting right atrial pressure of 3 mmHg.  6. The patient is in atrial fibrillation.   Patient Profile     60 y.o. 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, RA, GERD, sz, Covid 2020,who is being seen today for the evaluation ofatrial fib RVR  Assessment & Plan    Atrial fibrillation with RVR - HR in the 190s on admission improved with IVF and IV cardizem - in the setting of diarrhea and  dehydration - IV cardizem with poor rate control --> transition to IV-PO amiodarone - I started PO amiodarone and lopressor yesterday - he is maintaining sinus rhythm with rates in the 80-90s - consider increasing to 50 mg BID at OP visit - I hesitate to increase BB since he will be starting chemo and could add to fatigue - he states he has always had a high resting heart rate, so this is likely his baseline   Need for anticoagulation This patients CHA2DS2-VASc Score and unadjusted Ischemic Stroke Rate (% per year) is equal to 0.2 % stroke rate/year from a score of 0 - difficult situation - CT imaging suggests intraluminal thrombus (would need OAC) vs tumor pressing on PA ([place] would increase bleeding risks) - risks of OAC outweigh the benefit if this is tumor - he has elected to hold [facility] for now - appreciate ongoing input from oncology - continue DVT PPX while inpatient   Wheezing - ordered xopenex, avoid albuterol due to Afib RVR - no further wheezing   Orthostatic hypotension - positive on admission - likely related to dehydration and diarrhea with chemo and now D diff infection - recommend TED hose   Squamous cell lung cancer C diff colitis Intraluminal mass - mural thrombus vs tumor - has started chemo - [facility] and oncology following - appreciate guidance as to the pathology of the intraluminal mass to help guide decisions regarding anticoagulation    [facility] will sign off after seen by Dr. Acie Fredrickson Medication Recommendations:  Metoprolol 25 mg BID - will titrate OP, amiodarone 400 mg BID x 7 days then 200 mg daily Other recommendations (labs, testing, etc):  none Follow up as an outpatient:  I have messaged our office for appt at [facility] [place] with Dr. Radford Pax  For questions or updates, please contact [facility] Please consult [email or web address] for contact info under        Signed, Ledora Bottcher, PA  05/20/2020, 9:39 AM     Attending Note:   The patient was  seen and examined.  Agree with assessment and plan as noted above.  Changes made to the above note as needed.  Patient seen and independently examined with  Doreene Adas, PA .   We discussed all aspects of the encounter. I agree with the assessment and plan as stated above.  1.   PAF :   Is  in NSR currently .    As noted in above and yesterday's' note,  I think anticoagulation would be risky - especially if his lung has has eroded into his PA ( radiology has not confirmed this )  His CHADS2VASC score is 0 so he is at low risk for CVA   He may be discharged and will follow up with Dr. Radford Pax in the office.    I have spent a total of 40 minutes with patient reviewing hospital  notes , telemetry, EKGs, labs and examining patient as well as establishing an assessment and plan that was discussed with the patient. > 50% of time was spent in direct patient care.    Thayer Headings, Brooke Bonito., MD, [facility] 05/20/2020, 12:50 PM [address]  [place] Pager [phone]

## 2020-05-20 NOTE — Discharge Summary (Addendum)
[facility]  Physician Discharge Summary   Patient ID: John [name] MRN: [id number] DOB/AGE: 02-22-1960 60 y.o.  Admit date: 05/12/2020 Discharge date: 05/20/2020  PCP: [name], MD  DISCHARGE DIAGNOSES:  C. difficile infection E. 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 Outpatient follow-up with cardiology to be arranged by that service  2. Follow-up with the medical oncology    Home Health: None Equipment/Devices: None  CODE STATUS: Full code  DISCHARGE CONDITION: fair  Diet recommendation: Heart healthy  INITIAL HISTORY: 60 y.o.malewith PMH significant for squamous cell carcinoma of lung, rheumatoid arthritis, GERD.  Presented to the emergency department with complaints of generalized weakness.  He was started on systemic chemotherapy recently.  Patient was noted to be hyponatremic and neutropenic.  Had elevated D-dimer.  He was noted to be tachycardic.  Noted to have rapid atrial fibrillation.  He was hospitalized for further management.  Consultants: Medical oncology.  Infectious disease.  Cardiology  Procedures:    Transthoracic echocardiogram IMPRESSIONS  1. Left ventricular ejection fraction, by estimation, is 60 to 65%. The left ventricle has normal function. The left ventricle has no regional wall motion abnormalities. Left ventricular diastolic parameters are indeterminate.  2. Right ventricular systolic function is normal. The right ventricular size is normal. Tricuspid regurgitation signal is inadequate for assessing PA pressure.  3. The mitral valve is normal in structure. Trivial mitral valve regurgitation. No evidence of mitral stenosis.  4. The aortic valve is  tricuspid. Aortic valve regurgitation is not visualized. No aortic stenosis is present.  5. The inferior vena cava is normal in size with greater than 50% respiratory variability, suggesting right atrial pressure of 3 mmHg.  6. The patient is in atrial fibrillation.   HOSPITAL COURSE:   Acute C. difficile diarrhea/severe sepsis/febrile neutropenia Patient symptoms started about a week after he received chemotherapy.  He was noted to be neutropenic.  He presented with profuse diarrhea.  GI pathogen panel was unremarkable but C. difficile testing was positive.  Patient was initially started on oral vancomycin.  Changed over to fidaxomicin by infectious disease.  We will start his 10 days from April 4 since he completed his course of Keflex on April 3. Patient is diarrhea appears to have resolved.  Abdomen remains benign.   Neutrophil counts have improved.  E. coli bacteremia Blood cultures were repeated on 3/29 and negative so far.  Sensitivities reviewed.  Patient was changed over to oral Keflex and has completed 7-day course as recommended by ID.     Atrial fibrillation with RVR Patient was requiring Cardizem infusion.  Patient was seen by cardiology.  Patient underwent echocardiogram which showed normal systolic function.  TSH was normal.   He was changed over to oral Cardizem.  However her heart rate noted to be poorly controlled and so cardiology placed back on intravenous Cardizem.  Cardiology also recommended oral anticoagulation and so patient was initially started on therapeutic Lovenox which was changed over to Eliquis. Patient subsequently seen again by cardiology (Dr. Caryl Comes) over the weekend and was changed over to amiodarone.  Further discussion was held between cardiology and the patient on 4/2.  Based on these discussions Eliquis was discontinued. Patient was also switched over to amiodarone infusion.  Subsequently converted to sinus rhythm.  Changed over to amiodarone tablet.  Metoprolol was also added.  Seen by cardiology today and cleared for discharge.  Right arm swelling Dopplers negative for DVT.  Most likely due to IV infiltration. Swelling has improved.  Right lower lobe pulmonary artery filling defect CT angiogram of the chest showed a intraluminal nonocclusive filling defect which could be secondary to direct invasion of the vessel or an eccentric mural thrombus related to mass.  No primary pulmonary thromboembolism was noted.  Patient's medical oncologist is aware.  Stage IV squamous Cell carcinoma of right lung Diagnosed earlier this year.  Followed by Dr. [name].  Status post chemotherapy last week.  He was also getting radiation treatment and he completed this treatment on 4/1.  Normocytic anemia and thrombocytopenia Most likely secondary to chemotherapeutic effect.  Hemoglobin is stable for the most part.  Platelet counts have improved.    Odynophagia Probably related to radiation.  No thrush was noted on exam.    Treated with mouthwash  History of rheumatoid arthritis On leflunomide as well as Simponi.  Hyponatremia secondary to hypovolemia Stable  Hypokalemia Was low secondary to diarrhea.  Now improved.    GERD Continue PPI.  History of depression Continue Paxil.  Obesity Estimated body mass index is 32.18 kg/m as calculated from the following:   Height as of this encounter: 5' 11.5" (1.816 m).   Weight as of this encounter: 106.1 kg.   Patient feels much better.  Wants to go home.  Cleared by cardiology.  Diarrhea has resolved.  Okay for discharge.   PERTINENT LABS:  The results of significant diagnostics from this hospitalization (including imaging, microbiology, ancillary and laboratory) are listed below for reference.    Microbiology: Recent Results (from the past 240 hour(s))  SARS CORONAVIRUS 2 (TAT 6-24 HRS) Nasopharyngeal Nasopharyngeal Swab     Status: None   Collection Time: 05/12/20  5:09 AM    Specimen: Nasopharyngeal Swab  Result Value Ref Range Status   SARS Coronavirus 2 NEGATIVE NEGATIVE Final    Comment: (NOTE) SARS-CoV-2 target nucleic acids are NOT DETECTED.  The SARS-CoV-2 RNA is generally detectable in upper and lower respiratory specimens during the acute phase of infection. Negative results do not preclude SARS-CoV-2 infection, do not rule out co-infections with other pathogens, and should not be used as the sole basis for treatment or other patient management decisions. Negative results must be combined with clinical observations, patient history, and epidemiological information. The expected result is Negative.  Fact Sheet for Patients: SugarRoll.be  Fact Sheet for Healthcare Providers: https://www.woods-mathews.com/  This test is not yet approved or cleared by the [place] FDA and  has been authorized for detection and/or diagnosis of SARS-CoV-2 by FDA under an Emergency Use Authorization (EUA). This EUA will remain  in effect (meaning this test can be used) for the duration of the COVID-19 declaration under Se ction 564(b)(1) of the Act, 21 U.S.C. section 360bbb-3(b)(1), unless the authorization is terminated or revoked sooner.  Performed at [facility], [place] [address] [place], [place] [address]   Culture, blood (x 2)     Status: None   Collection Time: 05/12/20  9:43 AM   Specimen: BLOOD RIGHT HAND  Result Value Ref Range Status   Specimen Description   Final    BLOOD RIGHT HAND Performed at [facility] [address], [place], [place] [address]    Special Requests   Final    BOTTLES DRAWN AEROBIC ONLY Blood Culture adequate volume Performed at [facility], [place] [address] [place], [place] [address]    Culture   Final    NO GROWTH 5 DAYS Performed at [facility], [place] [address] [place], [place] [address]    Report Status 05/17/2020 FINAL   Final  Culture, blood (x 2)     Status: Abnormal   Collection Time: 05/12/20  9:43 AM   Specimen: Right Antecubital; Blood  Result Value Ref Range Status   Specimen Description   Final    RIGHT ANTECUBITAL Performed at [place] [address] [place], [place] [address]    Special Requests   Final    BOTTLES DRAWN AEROBIC AND ANAEROBIC Blood Culture adequate volume Performed at [facility] [address], [place], [place] [address]    Culture  Setup Time   Final    IN BOTH AEROBIC AND ANAEROBIC BOTTLES GRAM NEGATIVE RODS Organism ID to follow CRITICAL RESULT CALLED TO, READ BACK BY AND VERIFIED WITH: L POINDEXTER PHARMD 05/13/20 0405 JDW Performed at [facility], [place] [address], [place], [place] [address]    Culture ESCHERICHIA COLI (A)  Final   Report Status 05/15/2020 FINAL  Final   Organism ID, Bacteria ESCHERICHIA COLI  Final      Susceptibility   Escherichia coli - MIC*    AMPICILLIN >=32 RESISTANT Resistant     CEFAZOLIN <=4 SENSITIVE Sensitive     CEFEPIME <=0.12 SENSITIVE Sensitive     CEFTAZIDIME <=1 SENSITIVE Sensitive     CEFTRIAXONE <=0.25 SENSITIVE Sensitive     CIPROFLOXACIN <=0.25 SENSITIVE Sensitive     GENTAMICIN <=1 SENSITIVE Sensitive     IMIPENEM <=0.25 SENSITIVE Sensitive     TRIMETH/SULFA <=20 SENSITIVE Sensitive     AMPICILLIN/SULBACTAM >=32 RESISTANT Resistant     PIP/TAZO <=4 SENSITIVE Sensitive     * ESCHERICHIA COLI  Blood Culture ID Panel (Reflexed)     Status: Abnormal   Collection Time: 05/12/20  9:43 AM  Result Value Ref Range Status   Enterococcus faecalis NOT DETECTED NOT DETECTED Final   Enterococcus Faecium NOT DETECTED NOT DETECTED Final   Listeria monocytogenes NOT DETECTED NOT DETECTED Final   Staphylococcus species NOT DETECTED NOT DETECTED Final   Staphylococcus aureus (BCID) NOT DETECTED NOT DETECTED Final   Staphylococcus epidermidis NOT DETECTED NOT DETECTED Final    Staphylococcus lugdunensis NOT DETECTED NOT DETECTED Final   Streptococcus species NOT DETECTED NOT DETECTED Final   Streptococcus agalactiae NOT DETECTED NOT DETECTED Final   Streptococcus pneumoniae NOT DETECTED NOT DETECTED Final   Streptococcus pyogenes NOT DETECTED NOT DETECTED Final   A.calcoaceticus-baumannii NOT DETECTED NOT DETECTED Final   Bacteroides fragilis NOT DETECTED NOT DETECTED Final   Enterobacterales DETECTED (A) NOT DETECTED Final    Comment: Enterobacterales represent a large order of gram negative bacteria, not a single organism. CRITICAL RESULT CALLED TO, READ BACK BY AND VERIFIED WITH: L POINDEXTER PHAMRD 05/13/20 0405 JDW    Enterobacter cloacae complex NOT DETECTED NOT DETECTED Final   Escherichia coli DETECTED (A) NOT DETECTED Final    Comment: CRITICAL RESULT CALLED TO, READ BACK BY AND VERIFIED WITH: L POINDEXTER PHARMD 05/13/20 0405 JDW    Klebsiella aerogenes NOT DETECTED NOT DETECTED Final   Klebsiella oxytoca NOT DETECTED NOT DETECTED Final   Klebsiella pneumoniae NOT DETECTED NOT DETECTED Final   Proteus species NOT DETECTED NOT DETECTED Final   Salmonella species NOT DETECTED NOT DETECTED Final   Serratia marcescens NOT DETECTED NOT DETECTED  Final   Haemophilus influenzae NOT DETECTED NOT DETECTED Final   Neisseria meningitidis NOT DETECTED NOT DETECTED Final   Pseudomonas aeruginosa NOT DETECTED NOT DETECTED Final   Stenotrophomonas maltophilia NOT DETECTED NOT DETECTED Final   Candida albicans NOT DETECTED NOT DETECTED Final   Candida auris NOT DETECTED NOT DETECTED Final   Candida glabrata NOT DETECTED NOT DETECTED Final   Candida krusei NOT DETECTED NOT DETECTED Final   Candida parapsilosis NOT DETECTED NOT DETECTED Final   Candida tropicalis NOT DETECTED NOT DETECTED Final   Cryptococcus neoformans/gattii NOT DETECTED NOT DETECTED Final   CTX-M ESBL NOT DETECTED NOT DETECTED Final   Carbapenem resistance IMP NOT DETECTED NOT DETECTED Final    Carbapenem resistance KPC NOT DETECTED NOT DETECTED Final   Carbapenem resistance NDM NOT DETECTED NOT DETECTED Final   Carbapenem resist OXA 48 LIKE NOT DETECTED NOT DETECTED Final   Carbapenem resistance VIM NOT DETECTED NOT DETECTED Final    Comment: Performed at [facility], [address], [place], [place] [address]  Gastrointestinal Panel by PCR , Stool     Status: None   Collection Time: 05/13/20 12:48 PM   Specimen: Stool  Result Value Ref Range Status   Campylobacter species NOT DETECTED NOT DETECTED Final   Plesimonas shigelloides NOT DETECTED NOT DETECTED Final   Salmonella species NOT DETECTED NOT DETECTED Final   Yersinia enterocolitica NOT DETECTED NOT DETECTED Final   Vibrio species NOT DETECTED NOT DETECTED Final   Vibrio cholerae NOT DETECTED NOT DETECTED Final   Enteroaggregative E coli (EAEC) NOT DETECTED NOT DETECTED Final   Enteropathogenic E coli (EPEC) NOT DETECTED NOT DETECTED Final   Enterotoxigenic E coli (ETEC) NOT DETECTED NOT DETECTED Final   Shiga like toxin producing E coli (STEC) NOT DETECTED NOT DETECTED Final   Shigella/Enteroinvasive E coli (EIEC) NOT DETECTED NOT DETECTED Final   Cryptosporidium NOT DETECTED NOT DETECTED Final   Cyclospora cayetanensis NOT DETECTED NOT DETECTED Final   Entamoeba histolytica NOT DETECTED NOT DETECTED Final   Giardia lamblia NOT DETECTED NOT DETECTED Final   Adenovirus F40/41 NOT DETECTED NOT DETECTED Final   Astrovirus NOT DETECTED NOT DETECTED Final   Norovirus GI/GII NOT DETECTED NOT DETECTED Final   Rotavirus A NOT DETECTED NOT DETECTED Final   Sapovirus (I, II, IV, and V) NOT DETECTED NOT DETECTED Final    Comment: Performed at [facility], [address] [place], [place] [address]  Culture, blood (routine x 2)     Status: None   Collection Time: 05/13/20  2:53 PM   Specimen: BLOOD  Result Value Ref Range Status   Specimen Description   Final    BLOOD RIGHT ANTECUBITAL Performed at [facility], [place] [address] [place], [place] [address]    Special Requests   Final    BOTTLES DRAWN AEROBIC AND ANAEROBIC Blood Culture adequate volume Performed at [facility] [address], [place],  [address]    Culture   Final    NO GROWTH 5 DAYS Performed at [facility], [place] [address], [place],  [address]    Report Status 05/18/2020 FINAL  Final  Culture, blood (routine x 2)     Status: None   Collection Time: 05/13/20  2:53 PM   Specimen: BLOOD RIGHT HAND  Result Value Ref Range Status   Specimen Description   Final    BLOOD RIGHT HAND Performed at [facility]  [address] [place], [place] [address]    Special Requests   Final    BOTTLES DRAWN AEROBIC AND ANAEROBIC Blood Culture adequate volume Performed at [facility] [address] [place], [place] [address]    Culture   Final    NO GROWTH 5 DAYS Performed at [facility], [place] [address] [place], [place] [address]    Report Status 05/18/2020 FINAL  Final  C Difficile Quick Screen w PCR reflex     Status: Abnormal   Collection Time: 05/14/20  4:20 PM   Specimen: STOOL  Result Value Ref Range Status   C Diff antigen POSITIVE (A) NEGATIVE Final   C Diff toxin POSITIVE (A) NEGATIVE Final   C Diff interpretation Toxin producing C. difficile detected.  Final    Comment: CRITICAL RESULT CALLED TO, READ BACK BY AND VERIFIED WITH: GREGG,L. RN @1840  ON 03.30.2022 BY COHEN,K Performed at [facility], [place] [address], [place], [place] [address]      Labs:  COVID-19 Labs   Lab Results  Component Value Date   [place] 05/12/2020   [place] NEGATIVE 03/25/2020      Basic Metabolic Panel: Recent Labs  Lab 05/14/20 0424 05/15/20 0342 05/16/20 0338 05/17/20 0339 05/18/20 0329 05/19/20 0328 05/20/20 0431  NA 133* 133* 135 136 136 134* 132*  K 3.2* 3.3* 3.1* 3.7 3.5 3.6  3.9  CL 100 102 106 107 108 105 103  CO2 21* 23 21* 22 19* 23 22  GLUCOSE 96 104* 103* 103* 105* 100* 96  BUN 13 9 6  5* <5* 6 8  CREATININE 1.01 0.84 0.75 0.80 0.89 0.76 0.76  CALCIUM 7.6* 7.7* 7.6* 7.7* 7.7* 7.8* 8.1*  MG 1.9 2.0  --  1.8 1.8 1.8  --   PHOS 2.2*  --   --   --   --   --   --    Liver Function Tests: Recent Labs  Lab 05/18/20 0329  AST 20  ALT 15  ALKPHOS 100  BILITOT 0.7  PROT 5.5*  ALBUMIN 2.7*   CBC: Recent Labs  Lab 05/15/20 0342 05/16/20 0338 05/17/20 0339 05/18/20 0329 05/19/20 0328  WBC 3.0* 3.3* 3.0* 2.8* 2.5*  NEUTROABS 2.2 2.4 2.1 2.0 1.7  HGB 8.2* 8.7* 8.5* 9.1* 8.5*  HCT 25.4* 26.9* 27.0* 28.8* 26.5*  MCV 82.5 82.8 84.4 85.7 84.4  PLT 112* 129* 129* 140* 143*     IMAGING STUDIES CT Angio Chest PE W and/or Wo Contrast  Result Date: 05/12/2020 CLINICAL DATA:  Lung cancer following initiation of chemotherapy 1 week prior., Elevated D-dimer, weakness. EXAM: CT ANGIOGRAPHY CHEST WITH CONTRAST TECHNIQUE: Multidetector CT imaging of the chest was performed using the standard protocol during bolus administration of intravenous contrast. Multiplanar CT image reconstructions and MIPs were obtained to evaluate the vascular anatomy. CONTRAST:  118mL OMNIPAQUE IOHEXOL 350 MG/ML SOLN COMPARISON:  None. FINDINGS: Cardiovascular: There is adequate opacification of the pulmonary arterial tree. There is an infra luminal filling defect identified within the right lower lobe pulmonary artery which is eccentric and appears confluent with the adjacent pulmonary mass and likely reflects either direct invasion of the neoplasm into the right lower lobar pulmonary artery or mural irregularity secondary to neoplastic infiltration with secondary in situ thrombosis. No intraluminal filling defect, however, is identified to suggest pulmonary thromboembolism. The central pulmonary arteries are of normal caliber. No significant coronary artery calcification. Global cardiac size  within normal limits. No pericardial effusion. Mild atherosclerotic  calcification within the thoracic aorta. No aortic aneurysm. Mediastinum/Nodes: Thyroid unremarkable. As noted on prior PET CT examination of 04/29/2020, the mass is confluent with the right hilum. No additional pathologic thoracic adenopathy. The esophagus is unremarkable. Lungs/Pleura: Hypermetabolic cavitary mass is again identified conflict with right hila measuring roughly 4.5 x 7.1 cm at axial image # 59/6. Small satellite nodule again identified within the right upper lobe peripherally at axial image # 35/6 measuring 10 mm. Lungs are otherwise clear. No pneumothorax or pleural effusion. The mass obliterates the posterior segmental bronchus of the right upper lobe. Upper Abdomen: Limited images of the upper abdomen demonstrate the right adrenal metastasis partially, which measures at least 5.0 x 6.8 cm. Cholelithiasis again noted. Musculoskeletal: No lytic or blastic bone lesions are identified. Review of the MIP images confirms the above findings. IMPRESSION: Intraluminal nonocclusive filling defect within the right lower lobe pulmonary artery in keeping with eccentric mural thrombus or tumor thrombus related to probable direct invasion of the vessel by the adjacent neoplastic mass. No CT evidence of thromboembolic disease involving the pulmonary arterial tree. Stable cavitary mass within the right upper lobe obliterating the posterior segmental bronchus of the right upper lobe in keeping with the known primary neoplasm. This appears confluent with the right hilar nodal station. Small satellite nodule within the right upper lobe again noted. Right adrenal metastasis partially visualized. Cholelithiasis Aortic Atherosclerosis (ICD10-I70.0). Electronically Signed   By: Fidela Salisbury MD   On: 05/12/2020 04:46   NM PET Image Initial (PI) Skull Base To Thigh  Result Date: 04/30/2020 CLINICAL DATA:  Initial treatment strategy for  non-small-cell lung cancer. EXAM: NUCLEAR MEDICINE PET SKULL BASE TO THIGH TECHNIQUE: 12 mCi F-18 FDG was injected intravenously. Full-ring PET imaging was performed from the skull base to thigh after the radiotracer. CT data was obtained and used for attenuation correction and anatomic localization. Fasting blood glucose: 96 mg/dl COMPARISON:  Chest CT 03/11/2020 FINDINGS: Mediastinal blood pool activity: SUV max 2.3 Liver activity: SUV max NA NECK: No hypermetabolic lymph nodes in the neck. Incidental CT findings: none CHEST: Right parahilar lung mass is markedly hypermetabolic with SUV max = 36.6. Central photopenia in the lesion is compatible with necrosis. Hypermetabolic tissue extends from this dominant mass into the right hilum. Numerous tiny satellite nodules evident on CT imaging. 11 mm nodule more cranially in the right upper lobe (17/8) shows low level hypermetabolism with SUV max = 1.4. No hypermetabolic mediastinal or left hilar metastatic lymphadenopathy. No evidence for hypermetabolic axillary lymphadenopathy. Incidental CT findings: Coronary artery calcification is evident. Atherosclerotic calcification is noted in the wall of the thoracic aorta. Centrilobular emphsyema noted. 4 mm left upper lobe nodule identified on 68/4. This is stable since prior diagnostic chest CT in appeared calcified on that exam compatible with granuloma. ABDOMEN/PELVIS: No abnormal hypermetabolic activity within the liver, pancreas, left adrenal gland, or spleen. No hypermetabolic lymph nodes in the abdomen or pelvis. Right adrenal mass is progressive in the interval since prior CT, measuring 6.5 cm today compared to 5.4 cm previously. This lesion is markedly hypermetabolic with SUV max = 14 and demonstrates central necrosis. Incidental CT findings: 18 mm calcified gallstone noted in the neck of the gallbladder. Small duodenal diverticulum noted. There is abdominal aortic atherosclerosis without aneurysm. Small left groin  hernia contains only fat. SKELETON: No focal hypermetabolic activity to suggest skeletal metastasis. Incidental CT findings: No worrisome lytic or sclerotic osseous abnormality. IMPRESSION: 1. Right parahilar mass lesion is markedly hypermetabolic consistent with  primary bronchogenic neoplasm. The hypermetabolic soft tissue from this lesion extends into the upper right hilum. No evidence for hypermetabolic metastases in the neck, mediastinum or left hilum. 2. Numerous tiny satellite nodules in the right lung below threshold for resolution on PET imaging. Dominant additional right lung nodule measuring 11 mm in the right upper lobe shows only low level FDG accumulation. 3. Interval progression of right adrenal mass, now measuring 6.5 cm and demonstrating marked hypermetabolism, highly suspicious for metastatic disease. 4. No other findings to suggest hypermetabolic metastases in the abdomen or pelvis. 5. Cholelithiasis. 6.  Emphysema. (ICD10-J43.9) 7.  Aortic Atherosclerois (ICD10-170.0) Electronically Signed   By: Misty Stanley M.D.   On: 04/30/2020 11:04   CT Biopsy  Result Date: 05/06/2020 INDICATION: Right adrenal mass, history squamous cell lung carcinoma EXAM: CT-GUIDED BIOPSY RIGHT ADRENAL MASS MEDICATIONS: 1% LIDOCAINE LOCAL ANESTHESIA/SEDATION: Moderate Sedation Time:  NONE. The patient was continuously monitored during the procedure by the interventional radiology nurse under my direct supervision. PROCEDURE: The procedure, risks, benefits, and alternatives were explained to the patient. Questions regarding the procedure were encouraged and answered. The patient understands and consents to the procedure. previous imaging reviewed. patient positioned right side down decubitus. noncontrast localization ct performed. the right adrenal mass was localized and marked for a posterior paraspinous approach. under sterile conditions and local anesthesia, a 17 gauge 11.8 cm guide was advanced from a posterior  approach to the right adrenal mass. Needle position confirmed with CT. 18 gauge core biopsies obtained. Samples placed in formalin. Sampling was strandy and fragmented. Needle removed. Postprocedure imaging demonstrates a trace amount of surrounding retroperitoneal hemorrhage. Patient tolerated the procedure well without complication. Vital sign monitoring by nursing staff during the procedure will continue as patient is in the special procedures unit for post procedure observation. FINDINGS: The images document guide needle placement within the right adrenal mass. Post biopsy images demonstrate trace surrounding retroperitoneal hemorrhage. COMPLICATIONS: None immediate. IMPRESSION: Successful CT-guided core biopsy of the right adrenal mass Electronically Signed   By: [name].  Shick M.D.   On: 05/06/2020 13:42   DG Chest Portable 1 View  Result Date: 05/12/2020 CLINICAL DATA:  Weakness and tachycardia EXAM: PORTABLE CHEST 1 VIEW COMPARISON:  04/29/2020 FINDINGS: Cardiac shadows within normal limits. Lungs are well aerated bilaterally. Persistent central mass is noted along the minor fissure stable from the prior exam. Additional and nodule is noted in the right upper lobe stable from the prior CT examination. No other focal abnormality is seen. IMPRESSION: Stable right-sided lung masses. Electronically Signed   By: Inez Catalina M.D.   On: 05/12/2020 03:11   ECHOCARDIOGRAM COMPLETE  Result Date: 05/12/2020    ECHOCARDIOGRAM REPORT   Patient Name:   [facility] Date of Exam: 05/12/2020 Medical Rec #:  [id number]     Height:       71.5 in Accession #:    [id number]    Weight:       234.0 lb Date of Birth:  30-Aug-1960     BSA:          2.266 m Patient Age:    55 years      BP:           100/78 mmHg Patient Gender: M             HR:           116 bpm. Exam Location:  Inpatient Procedure: 2D Echo, Cardiac Doppler and Color Doppler Indications:    Atrial  fibrillation  History:        Patient has no prior history of  Echocardiogram examinations.                 Arrythmias:Atrial Fibrillation; Signs/Symptoms:Lung cancer,                 chemo, radiation therapy.  Sonographer:    Dustin Flock Referring Phys: [id number] [place] Comments: Image acquisition challenging due to respiratory motion. IMPRESSIONS  1. Left ventricular ejection fraction, by estimation, is 60 to 65%. The left ventricle has normal function. The left ventricle has no regional wall motion abnormalities. Left ventricular diastolic parameters are indeterminate.  2. Right ventricular systolic function is normal. The right ventricular size is normal. Tricuspid regurgitation signal is inadequate for assessing PA pressure.  3. The mitral valve is normal in structure. Trivial mitral valve regurgitation. No evidence of mitral stenosis.  4. The aortic valve is tricuspid. Aortic valve regurgitation is not visualized. No aortic stenosis is present.  5. The inferior vena cava is normal in size with greater than 50% respiratory variability, suggesting right atrial pressure of 3 mmHg.  6. The patient is in atrial fibrillation. FINDINGS  Left Ventricle: Left ventricular ejection fraction, by estimation, is 60 to 65%. The left ventricle has normal function. The left ventricle has no regional wall motion abnormalities. The left ventricular internal cavity size was normal in size. There is  no left ventricular hypertrophy. Left ventricular diastolic parameters are indeterminate. Right Ventricle: The right ventricular size is normal. No increase in right ventricular wall thickness. Right ventricular systolic function is normal. Tricuspid regurgitation signal is inadequate for assessing PA pressure. Left Atrium: Left atrial size was normal in size. Right Atrium: Right atrial size was normal in size. Pericardium: There is no evidence of pericardial effusion. Mitral Valve: The mitral valve is normal in structure. Trivial mitral valve regurgitation. No  evidence of mitral valve stenosis. Tricuspid Valve: The tricuspid valve is normal in structure. Tricuspid valve regurgitation is not demonstrated. Aortic Valve: The aortic valve is tricuspid. Aortic valve regurgitation is not visualized. No aortic stenosis is present. Pulmonic Valve: The pulmonic valve was normal in structure. Pulmonic valve regurgitation is not visualized. Aorta: The aortic root is normal in size and structure. Venous: The inferior vena cava is normal in size with greater than 50% respiratory variability, suggesting right atrial pressure of 3 mmHg. IAS/Shunts: No atrial level shunt detected by color flow Doppler.  LEFT VENTRICLE PLAX 2D LVIDd:         5.30 cm  Diastology LVIDs:         3.90 cm  LV e' medial:    6.74 cm/s LV PW:         1.10 cm  LV E/e' medial:  11.4 LV IVS:        0.90 cm  LV e' lateral:   6.85 cm/s LVOT diam:     2.20 cm  LV E/e' lateral: 11.2 LV SV:         73 LV SV Index:   32 LVOT Area:     3.80 cm  RIGHT VENTRICLE RV Basal diam:  2.40 cm RV S prime:     9.68 cm/s TAPSE (M-mode): 2.4 cm LEFT ATRIUM             Index       RIGHT ATRIUM           Index LA diam:  3.40 cm 1.50 cm/m  RA Area:     14.40 cm LA Vol (A2C):   48.1 ml 21.23 ml/m RA Volume:   39.50 ml  17.43 ml/m LA Vol (A4C):   39.6 ml 17.48 ml/m LA Biplane Vol: 45.1 ml 19.90 ml/m  AORTIC VALVE LVOT Vmax:   111.00 cm/s LVOT Vmean:  67.600 cm/s LVOT VTI:    0.191 m  AORTA Ao Root diam: 3.50 cm MITRAL VALVE MV Area (PHT): 5.54 cm    SHUNTS MV Decel Time: 137 msec    Systemic VTI:  0.19 m MV E velocity: 76.70 cm/s  Systemic Diam: 2.20 cm MV A velocity: 65.10 cm/s MV E/A ratio:  1.18 Loralie Champagne MD Electronically signed by Loralie Champagne MD Signature Date/Time: 05/12/2020/3:33:39 PM    Final    VAS [place] UPPER EXTREMITY VENOUS DUPLEX  Result Date: 05/16/2020 UPPER VENOUS STUDY  Indications: Swelling, and Recent IV attempted in RUE Risk Factors: Cancer Recently DX'd with Lung CA. Comparison Study: No previous  exams Performing Technologist: Rogelia Rohrer  Examination Guidelines: A complete evaluation includes B-mode imaging, spectral Doppler, color Doppler, and power Doppler as needed of all accessible portions of each vessel. Bilateral testing is considered an integral part of a complete examination. Limited examinations for reoccurring indications may be performed as noted.  Right Findings: +----------+------------+---------+-----------+----------+-------+ RIGHT     CompressiblePhasicitySpontaneousPropertiesSummary +----------+------------+---------+-----------+----------+-------+ IJV           Full       Yes       Yes                      +----------+------------+---------+-----------+----------+-------+ Subclavian    Full       Yes       Yes                      +----------+------------+---------+-----------+----------+-------+ Axillary      Full       Yes       Yes                      +----------+------------+---------+-----------+----------+-------+ Brachial      Full       Yes       Yes                      +----------+------------+---------+-----------+----------+-------+ Radial        Full                                          +----------+------------+---------+-----------+----------+-------+ Ulnar         Full                                          +----------+------------+---------+-----------+----------+-------+ Cephalic      Full                                          +----------+------------+---------+-----------+----------+-------+ Basilic       Full       Yes       Yes                      +----------+------------+---------+-----------+----------+-------+  Subcutaneous edema seen from area of brachial vein into forearm.  Left Findings: +----------+------------+---------+-----------+----------+-------+ LEFT      CompressiblePhasicitySpontaneousPropertiesSummary +----------+------------+---------+-----------+----------+-------+  Subclavian    Full       Yes       Yes                      +----------+------------+---------+-----------+----------+-------+  Summary:  Right: No evidence of deep vein thrombosis in the upper extremity. No evidence of superficial vein thrombosis in the upper extremity.  Left: No evidence of thrombosis in the subclavian.  *See table(s) above for measurements and observations.  Diagnosing physician: Monica Martinez MD Electronically signed by Monica Martinez MD on 05/16/2020 at 1:45:52 PM.    Final     DISCHARGE EXAMINATION: Vitals:   05/19/20 0602 05/19/20 1304 05/19/20 2019 05/20/20 0439  BP: (!) 134/92 126/83 131/89 121/81  Pulse: 92 (!) 102 90 87  Resp: 18 18 19 18   Temp: 98.1 F (36.7 C) 97.8 F (36.6 C) 98.4 F (36.9 C) 98.3 F (36.8 C)  TempSrc: Oral Oral Oral Oral  SpO2: 95% 97% 95% 96%  Weight:      Height:       General appearance: Awake alert.  In no distress Resp: Clear to auscultation bilaterally.  Normal effort Cardio: S1-S2 is normal regular.  No S3-S4.  No rubs murmurs or bruit GI: Abdomen is soft.  Nontender nondistended.  Bowel sounds are present normal.  No masses organomegaly     DISPOSITION: Home  Discharge Instructions    Call MD for:  difficulty breathing, headache or visual disturbances   Complete by: As directed    Call MD for:  extreme fatigue   Complete by: As directed    Call MD for:  hives   Complete by: As directed    Call MD for:  persistant dizziness or light-headedness   Complete by: As directed    Call MD for:  persistant nausea and vomiting   Complete by: As directed    Call MD for:  severe uncontrolled pain   Complete by: As directed    Call MD for:  temperature >100.4   Complete by: As directed    Diet - low sodium heart healthy   Complete by: As directed    Discharge instructions   Complete by: As directed    Please take your medications as prescribed.  Follow-up with your PCP in 1 week.  Cardiology will call you with  appointment.  Follow-up with your oncologist as scheduled.  You were cared for by a hospitalist during your hospital stay. If you have any questions about your discharge medications or the care you received while you were in the hospital after you are discharged, you can call the unit and asked to speak with the hospitalist on call if the hospitalist that took care of you is not available. Once you are discharged, your primary care physician will handle any further medical issues. Please note that NO REFILLS for any discharge medications will be authorized once you are discharged, as it is imperative that you return to your primary care physician (or establish a relationship with a primary care physician if you do not have one) for your aftercare needs so that they can reassess your need for medications and monitor your lab values. If you do not have a primary care physician, you can call [phone] for a physician referral.   Increase activity slowly   Complete by: As directed  Allergies as of 05/20/2020   No Known Allergies     Medication List    TAKE these medications   acetaminophen 500 MG tablet Commonly known as: TYLENOL Take 500 mg by mouth every 6 (six) hours as needed for mild pain or moderate pain.   amiodarone 200 MG tablet Commonly known as: PACERONE Take 2 tablets twice daily for 7 days then 1 tablet once daily   CARBOplatin 1000 MG/100ML Soln Inject into the vein.   fidaxomicin 200 MG Tabs tablet Commonly known as: DIFICID Take 1 tablet (200 mg total) by mouth 2 (two) times daily for 9 days.   leflunomide 20 MG tablet Commonly known as: ARAVA Take 1 tablet (20 mg total) by mouth at bedtime. MAY RESUME AFTER COMPLETING TREATMENT FOR C-DIFF What changed: additional instructions   metoprolol tartrate 25 MG tablet Commonly known as: LOPRESSOR Take 1 tablet (25 mg total) by mouth 2 (two) times daily.   PACLitaxel 100 MG/16.67ML Conc Inject into the vein.    PARoxetine 10 MG tablet Commonly known as: PAXIL Take 10 mg by mouth daily.   PriLOSEC OTC 20 MG tablet Generic drug: omeprazole Take 20 mg by mouth at bedtime.   prochlorperazine 10 MG tablet Commonly known as: COMPAZINE Take 1 tablet (10 mg total) by mouth every 6 (six) hours as needed for nausea or vomiting.   Simponi 50 MG/0.5ML Soaj Generic drug: Golimumab Inject 50 mg into the skin every 30 (thirty) days. MAY RESUME AFTER COMPLETING TREATMENT FOR C-DIFF What changed: additional instructions   sodium chloride 0.9 % SOLN 50 mL with pembrolizumab 100 MG/4ML SOLN 2 mg/kg Inject 2 mg/kg into the vein.   Ziextenzo 6 MG/0.6ML injection Generic drug: pegfilgrastim-bmez Inject 0.6 mL into the skin 2 days after chemotherapy         Follow-up Information    Sueanne Margarita, MD Follow up.   Specialty: Cardiology Contact information: [address] [place] [address] [phone]        [name], MD. Schedule an appointment as soon as possible for a visit in 1 week(s).   Specialty: Internal Medicine Contact information: [place] [phone]               TOTAL DISCHARGE TIME: 2 minutes  [place]  [facility] Pager on [email or web address]  05/20/2020, 1:45 PM

## 2020-05-20 NOTE — Progress Notes (Signed)
Pt discharged to home, instruction reviewed with pt acknowledged understanding on instructions. SRP, RN

## 2020-05-20 NOTE — Plan of Care (Signed)
  Problem: Education: Goal: Understanding of medication regimen will improve 05/20/2020 1243 by Zadie Rhine, RN Outcome: Progressing 05/20/2020 1118 by Zadie Rhine, RN Outcome: Progressing Goal: Individualized Educational Video(s) Outcome: Progressing   Problem: Activity: Goal: Ability to tolerate increased activity will improve Outcome: Progressing

## 2020-05-21 ENCOUNTER — Ambulatory Visit: Payer: 59

## 2020-05-22 ENCOUNTER — Ambulatory Visit: Payer: 59

## 2020-05-22 ENCOUNTER — Telehealth: Payer: Self-pay | Admitting: Medical Oncology

## 2020-05-22 NOTE — Telephone Encounter (Addendum)
LVM to confirm pt has G-CSF for home injection. If yes, cancel his injection appt on 04/13.  G-CSF for home injection-Pt called back and said he talked to [place] today and was told that  the injection will be here by next Tuesday, I told John [name] to let us know if it does not arrive by Tuesday.

## 2020-05-23 ENCOUNTER — Ambulatory Visit: Payer: 59

## 2020-05-26 ENCOUNTER — Ambulatory Visit: Payer: 59

## 2020-05-26 ENCOUNTER — Inpatient Hospital Stay: Payer: 59

## 2020-05-26 ENCOUNTER — Encounter: Payer: Self-pay | Admitting: Internal Medicine

## 2020-05-26 ENCOUNTER — Other Ambulatory Visit: Payer: Self-pay

## 2020-05-26 ENCOUNTER — Inpatient Hospital Stay: Payer: 59 | Admitting: Internal Medicine

## 2020-05-26 VITALS — BP 108/78 | HR 83 | Temp 96.6°F | Resp 19 | Ht 71.5 in | Wt 220.2 lb

## 2020-05-26 DIAGNOSIS — R531 Weakness: Secondary | ICD-10-CM | POA: Diagnosis not present

## 2020-05-26 DIAGNOSIS — I700 Atherosclerosis of aorta: Secondary | ICD-10-CM | POA: Diagnosis not present

## 2020-05-26 DIAGNOSIS — Z5111 Encounter for antineoplastic chemotherapy: Secondary | ICD-10-CM | POA: Diagnosis not present

## 2020-05-26 DIAGNOSIS — R5383 Other fatigue: Secondary | ICD-10-CM | POA: Diagnosis not present

## 2020-05-26 DIAGNOSIS — C3491 Malignant neoplasm of unspecified part of right bronchus or lung: Secondary | ICD-10-CM

## 2020-05-26 DIAGNOSIS — Z79899 Other long term (current) drug therapy: Secondary | ICD-10-CM | POA: Diagnosis not present

## 2020-05-26 DIAGNOSIS — G629 Polyneuropathy, unspecified: Secondary | ICD-10-CM | POA: Diagnosis not present

## 2020-05-26 DIAGNOSIS — M069 Rheumatoid arthritis, unspecified: Secondary | ICD-10-CM | POA: Diagnosis not present

## 2020-05-26 DIAGNOSIS — R630 Anorexia: Secondary | ICD-10-CM | POA: Diagnosis not present

## 2020-05-26 DIAGNOSIS — C7971 Secondary malignant neoplasm of right adrenal gland: Secondary | ICD-10-CM | POA: Diagnosis not present

## 2020-05-26 DIAGNOSIS — C3411 Malignant neoplasm of upper lobe, right bronchus or lung: Secondary | ICD-10-CM | POA: Diagnosis present

## 2020-05-26 DIAGNOSIS — R197 Diarrhea, unspecified: Secondary | ICD-10-CM | POA: Diagnosis not present

## 2020-05-26 DIAGNOSIS — R0609 Other forms of dyspnea: Secondary | ICD-10-CM | POA: Diagnosis not present

## 2020-05-26 DIAGNOSIS — R634 Abnormal weight loss: Secondary | ICD-10-CM | POA: Diagnosis not present

## 2020-05-26 DIAGNOSIS — Z5112 Encounter for antineoplastic immunotherapy: Secondary | ICD-10-CM | POA: Diagnosis present

## 2020-05-26 DIAGNOSIS — J439 Emphysema, unspecified: Secondary | ICD-10-CM | POA: Diagnosis not present

## 2020-05-26 LAB — CBC WITH DIFFERENTIAL (CANCER CENTER ONLY)
Abs Immature Granulocytes: 0.01 10*3/uL (ref 0.00–0.07)
Basophils Absolute: 0.1 10*3/uL (ref 0.0–0.1)
Basophils Relative: 2 %
Eosinophils Absolute: 0.2 10*3/uL (ref 0.0–0.5)
Eosinophils Relative: 7 %
HCT: 31.1 % — ABNORMAL LOW (ref 39.0–52.0)
Hemoglobin: 10.3 g/dL — ABNORMAL LOW (ref 13.0–17.0)
Immature Granulocytes: 0 %
Lymphocytes Relative: 30 %
Lymphs Abs: 0.7 10*3/uL (ref 0.7–4.0)
MCH: 27.5 pg (ref 26.0–34.0)
MCHC: 33.1 g/dL (ref 30.0–36.0)
MCV: 82.9 fL (ref 80.0–100.0)
Monocytes Absolute: 0.6 10*3/uL (ref 0.1–1.0)
Monocytes Relative: 24 %
Neutro Abs: 0.9 10*3/uL — ABNORMAL LOW (ref 1.7–7.7)
Neutrophils Relative %: 37 %
Platelet Count: 323 10*3/uL (ref 150–400)
RBC: 3.75 MIL/uL — ABNORMAL LOW (ref 4.22–5.81)
RDW: 19 % — ABNORMAL HIGH (ref 11.5–15.5)
WBC Count: 2.4 10*3/uL — ABNORMAL LOW (ref 4.0–10.5)
nRBC: 0 % (ref 0.0–0.2)

## 2020-05-26 LAB — CMP (CANCER CENTER ONLY)
ALT: 14 U/L (ref 0–44)
AST: 19 U/L (ref 15–41)
Albumin: 3.2 g/dL — ABNORMAL LOW (ref 3.5–5.0)
Alkaline Phosphatase: 107 U/L (ref 38–126)
Anion gap: 11 (ref 5–15)
BUN: 13 mg/dL (ref 6–20)
CO2: 21 mmol/L — ABNORMAL LOW (ref 22–32)
Calcium: 8.8 mg/dL — ABNORMAL LOW (ref 8.9–10.3)
Chloride: 103 mmol/L (ref 98–111)
Creatinine: 1 mg/dL (ref 0.61–1.24)
GFR, Estimated: >60 mL/min (ref >60)
Glucose, Bld: 126 mg/dL — ABNORMAL HIGH (ref 70–99)
Potassium: 4.1 mmol/L (ref 3.5–5.1)
Sodium: 135 mmol/L (ref 135–145)
Total Bilirubin: 0.7 mg/dL (ref 0.3–1.2)
Total Protein: 7.2 g/dL (ref 6.5–8.1)

## 2020-05-26 LAB — TSH: TSH: 2.805 u[IU]/mL (ref 0.320–4.118)

## 2020-05-26 NOTE — Progress Notes (Signed)
[place] [phone]   [phone]  OFFICE PROGRESS NOTE  [name], MD [place] [id number]  DIAGNOSIS: stage IIIb/IV (T4, N2, M0/M1 C)non-small cell lung cancer, squamous cell carcinoma presented with large right upper lobe lung mass in addition to satellite nodule in the right upper lobe and few other subcentimeter nodules as well as right hilar and mediastinal lymphadenopathy and very suspicious right adrenal gland mass diagnosed in February 2022.  He also has rheumatoid arthritis.  PDL1: 70%  PRIOR THERAPY:  Palliative radiotherapy to the large right upper lobe lung mass and mediastinal lymphadenopathy under the care of Dr. [name]  CURRENT THERAPY:  Systemic chemotherapy with carboplatin for AUC of 5, paclitaxel 175 mg/M2 and Keytruda 200 mg IV every 3 weeks with Neulasta support.  First dose May 06, 2019.  Status post 1 cycle.  INTERVAL HISTORY: John [name] 60 y.o. male returns to the clinic today for follow-up visit accompanied by his wife.  The patient continues to complain of increasing fatigue and weakness.  He was admitted to the hospital after the first dose of his treatment with diarrhea and significant weakness.  He was diagnosed with C. difficile and treated with a course of antibiotics.  He still has 3 more days to go.  He denied having any current chest pain, shortness of breath, cough or hemoptysis.  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  He denied having any fever or chills.  He has no current nausea, vomiting or constipation.  He was supposed to start cycle #2 today.  MEDICAL HISTORY: Past Medical History:  Diagnosis Date  . Dyspnea   . Family history of adverse reaction to anesthesia    brother with seizures had episode under anesthesia.  Patient has seizures as well.  [name] GERD (gastroesophageal reflux disease)   . Rheumatoid aortitis   . Squamous cell lung cancer (HCC)      ALLERGIES:  has No Known Allergies.  MEDICATIONS:  Current Outpatient Medications  Medication Sig Dispense Refill  . acetaminophen (TYLENOL) 500 MG tablet Take 500 mg by mouth every 6 (six) hours as needed for mild pain or moderate pain.    [name] amiodarone (PACERONE) 200 MG tablet Take 2 tablets twice daily for 7 days then 1 tablet once daily 60 tablet 1  . CARBOplatin 1000 MG/100ML SOLN Inject into the vein.    . fidaxomicin (DIFICID) 200 MG TABS tablet Take 1 tablet (200 mg total) by mouth 2 (two) times daily for 9 days. 18 tablet 0  . leflunomide (ARAVA) 20 MG tablet Take 1 tablet (20 mg total) by mouth at bedtime. MAY RESUME AFTER COMPLETING TREATMENT FOR C-DIFF    . metoprolol tartrate (LOPRESSOR) 25 MG tablet Take 1 tablet (25 mg total) by mouth 2 (two) times daily. 60 tablet 1  . PACLitaxel 100 MG/16.67ML CONC Inject into the vein.    [name] PARoxetine (PAXIL) 10 MG tablet Take 10 mg by mouth daily.    . pegfilgrastim-bmez (ZIEXTENZO) 6 MG/0.6ML injection Inject 0.6 mL into the skin 2 days after chemotherapy 0.6 mL 3  . PRILOSEC OTC 20 MG tablet Take 20 mg by mouth at bedtime.    . prochlorperazine (COMPAZINE) 10 MG tablet Take 1 tablet (10 mg total) by mouth every 6 (six) hours as needed for nausea or vomiting. 30 tablet 0  . SIMPONI 50 MG/0.5ML SOAJ Inject 50 mg into the skin every 30 (thirty) days.  MAY RESUME AFTER COMPLETING TREATMENT FOR C-DIFF 0.3 mL   . sodium chloride 0.9 % SOLN 50 mL with pembrolizumab 100 MG/4ML SOLN 2 mg/kg Inject 2 mg/kg into the vein.     No current facility-administered medications for this visit.    SURGICAL HISTORY:  Past Surgical History:  Procedure Laterality Date  . BRONCHIAL BRUSHINGS  03/27/2020   Procedure: BRONCHIAL BRUSHINGS;  Surgeon: [name], MD;  Location: [facility] ENDOSCOPY;  Service: Cardiopulmonary;;  . CATARACT EXTRACTION  2016   at [facility]  . FINE NEEDLE ASPIRATION  03/27/2020   Procedure: FINE NEEDLE ASPIRATION;  Surgeon: [name], MD;  Location: [facility] ENDOSCOPY;  Service: Cardiopulmonary;;  . HIP SURGERY Left   . VIDEO BRONCHOSCOPY WITH ENDOBRONCHIAL ULTRASOUND N/A 03/27/2020   Procedure: VIDEO BRONCHOSCOPY WITH ENDOBRONCHIAL ULTRASOUND;  Surgeon: [name], MD;  Location: [place];  Service: Cardiopulmonary;  Laterality: N/A;    REVIEW OF SYSTEMS:  Constitutional: positive for anorexia, fatigue and weight loss Eyes: negative Ears, nose, mouth, throat, and face: negative Respiratory: positive for dyspnea on exertion Cardiovascular: negative Gastrointestinal: positive for diarrhea Genitourinary:negative Integument/breast: negative Hematologic/lymphatic: negative Musculoskeletal:negative Neurological: negative Behavioral/Psych: negative Endocrine: negative Allergic/Immunologic: negative   PHYSICAL EXAMINATION: General appearance: alert, cooperative, fatigued and no distress Head: Normocephalic, without obvious abnormality, atraumatic Neck: no adenopathy, no JVD, supple, symmetrical, trachea midline and thyroid not enlarged, symmetric, no tenderness/mass/nodules Lymph nodes: Cervical, supraclavicular, and axillary nodes normal. Resp: clear to auscultation bilaterally Back: symmetric, no curvature. ROM normal. No CVA tenderness. Cardio: regular rate and rhythm, S1, S2 normal, no murmur, click, rub or gallop GI: soft, non-tender; bowel sounds normal; no masses,  no organomegaly Extremities: extremities normal, atraumatic, no cyanosis or edema Neurologic: Alert and oriented X 3, normal strength and tone. Normal symmetric reflexes. Normal coordination and gait  ECOG PERFORMANCE STATUS: 1 - Symptomatic but completely ambulatory  Blood pressure 108/78, pulse 83, temperature (!) 96.6 F (35.9 C), temperature source Tympanic, resp. rate 19, height 5' 11.5" (1.816 m), weight 220 lb 3.2 oz (99.9 kg), SpO2 100 %.  LABORATORY DATA: Lab Results  Component Value Date   WBC 2.5 (L) 05/19/2020   HGB 8.5 (L)  05/19/2020   HCT 26.5 (L) 05/19/2020   MCV 84.4 05/19/2020   PLT 143 (L) 05/19/2020      Chemistry      Component Value Date/Time   NA 132 (L) 05/20/2020 0431   K 3.9 05/20/2020 0431   CL 103 05/20/2020 0431   CO2 22 05/20/2020 0431   BUN 8 05/20/2020 0431   CREATININE 0.76 05/20/2020 0431   CREATININE 0.90 05/05/2020 0745      Component Value Date/Time   CALCIUM 8.1 (L) 05/20/2020 0431   ALKPHOS 100 05/18/2020 0329   AST 20 05/18/2020 0329   AST 16 05/05/2020 0745   ALT 15 05/18/2020 0329   ALT 13 05/05/2020 0745   BILITOT 0.7 05/18/2020 0329   BILITOT 0.9 05/05/2020 0745       RADIOGRAPHIC STUDIES: CT Angio Chest PE W and/or Wo Contrast  Result Date: 05/12/2020 CLINICAL DATA:  Lung cancer following initiation of chemotherapy 1 week prior., Elevated D-dimer, weakness. EXAM: CT ANGIOGRAPHY CHEST WITH CONTRAST TECHNIQUE: Multidetector CT imaging of the chest was performed using the standard protocol during bolus administration of intravenous contrast. Multiplanar CT image reconstructions and MIPs were obtained to evaluate the vascular anatomy. CONTRAST:  122m OMNIPAQUE IOHEXOL 350 MG/ML SOLN COMPARISON:  None. FINDINGS: Cardiovascular: There is adequate opacification of the  pulmonary arterial tree. There is an infra luminal filling defect identified within the right lower lobe pulmonary artery which is eccentric and appears confluent with the adjacent pulmonary mass and likely reflects either direct invasion of the neoplasm into the right lower lobar pulmonary artery or mural irregularity secondary to neoplastic infiltration with secondary in situ thrombosis. No intraluminal filling defect, however, is identified to suggest pulmonary thromboembolism. The central pulmonary arteries are of normal caliber. No significant coronary artery calcification. Global cardiac size within normal limits. No pericardial effusion. Mild atherosclerotic calcification within the thoracic aorta. No  aortic aneurysm. Mediastinum/Nodes: Thyroid unremarkable. As noted on prior PET CT examination of 04/29/2020, the mass is confluent with the right hilum. No additional pathologic thoracic adenopathy. The esophagus is unremarkable. Lungs/Pleura: Hypermetabolic cavitary mass is again identified conflict with right hila measuring roughly 4.5 x 7.1 cm at axial image # 59/6. Small satellite nodule again identified within the right upper lobe peripherally at axial image # 35/6 measuring 10 mm. Lungs are otherwise clear. No pneumothorax or pleural effusion. The mass obliterates the posterior segmental bronchus of the right upper lobe. Upper Abdomen: Limited images of the upper abdomen demonstrate the right adrenal metastasis partially, which measures at least 5.0 x 6.8 cm. Cholelithiasis again noted. Musculoskeletal: No lytic or blastic bone lesions are identified. Review of the MIP images confirms the above findings. IMPRESSION: Intraluminal nonocclusive filling defect within the right lower lobe pulmonary artery in keeping with eccentric mural thrombus or tumor thrombus related to probable direct invasion of the vessel by the adjacent neoplastic mass. No CT evidence of thromboembolic disease involving the pulmonary arterial tree. Stable cavitary mass within the right upper lobe obliterating the posterior segmental bronchus of the right upper lobe in keeping with the known primary neoplasm. This appears confluent with the right hilar nodal station. Small satellite nodule within the right upper lobe again noted. Right adrenal metastasis partially visualized. Cholelithiasis Aortic Atherosclerosis (ICD10-I70.0). Electronically Signed   By: Fidela Salisbury MD   On: 05/12/2020 04:46   NM PET Image Initial (PI) Skull Base To Thigh  Result Date: 04/30/2020 CLINICAL DATA:  Initial treatment strategy for non-small-cell lung cancer. EXAM: NUCLEAR MEDICINE PET SKULL BASE TO THIGH TECHNIQUE: 12 mCi F-18 FDG was injected  intravenously. Full-ring PET imaging was performed from the skull base to thigh after the radiotracer. CT data was obtained and used for attenuation correction and anatomic localization. Fasting blood glucose: 96 mg/dl COMPARISON:  Chest CT 03/11/2020 FINDINGS: Mediastinal blood pool activity: SUV max 2.3 Liver activity: SUV max NA NECK: No hypermetabolic lymph nodes in the neck. Incidental CT findings: none CHEST: Right parahilar lung mass is markedly hypermetabolic with SUV max = 17.9. Central photopenia in the lesion is compatible with necrosis. Hypermetabolic tissue extends from this dominant mass into the right hilum. Numerous tiny satellite nodules evident on CT imaging. 11 mm nodule more cranially in the right upper lobe (17/8) shows low level hypermetabolism with SUV max = 1.4. No hypermetabolic mediastinal or left hilar metastatic lymphadenopathy. No evidence for hypermetabolic axillary lymphadenopathy. Incidental CT findings: Coronary artery calcification is evident. Atherosclerotic calcification is noted in the wall of the thoracic aorta. Centrilobular emphsyema noted. 4 mm left upper lobe nodule identified on 68/4. This is stable since prior diagnostic chest CT in appeared calcified on that exam compatible with granuloma. ABDOMEN/PELVIS: No abnormal hypermetabolic activity within the liver, pancreas, left adrenal gland, or spleen. No hypermetabolic lymph nodes in the abdomen or pelvis. Right adrenal mass is  progressive in the interval since prior CT, measuring 6.5 cm today compared to 5.4 cm previously. This lesion is markedly hypermetabolic with SUV max = 14 and demonstrates central necrosis. Incidental CT findings: 18 mm calcified gallstone noted in the neck of the gallbladder. Small duodenal diverticulum noted. There is abdominal aortic atherosclerosis without aneurysm. Small left groin hernia contains only fat. SKELETON: No focal hypermetabolic activity to suggest skeletal metastasis. Incidental CT  findings: No worrisome lytic or sclerotic osseous abnormality. IMPRESSION: 1. Right parahilar mass lesion is markedly hypermetabolic consistent with primary bronchogenic neoplasm. The hypermetabolic soft tissue from this lesion extends into the upper right hilum. No evidence for hypermetabolic metastases in the neck, mediastinum or left hilum. 2. Numerous tiny satellite nodules in the right lung below threshold for resolution on PET imaging. Dominant additional right lung nodule measuring 11 mm in the right upper lobe shows only low level FDG accumulation. 3. Interval progression of right adrenal mass, now measuring 6.5 cm and demonstrating marked hypermetabolism, highly suspicious for metastatic disease. 4. No other findings to suggest hypermetabolic metastases in the abdomen or pelvis. 5. Cholelithiasis. 6.  Emphysema. (ICD10-J43.9) 7.  Aortic Atherosclerois (ICD10-170.0) Electronically Signed   By: Misty Stanley M.D.   On: 04/30/2020 11:04   CT Biopsy  Result Date: 05/06/2020 INDICATION: Right adrenal mass, history squamous cell lung carcinoma EXAM: CT-GUIDED BIOPSY RIGHT ADRENAL MASS MEDICATIONS: 1% LIDOCAINE LOCAL ANESTHESIA/SEDATION: Moderate Sedation Time:  NONE. The patient was continuously monitored during the procedure by the interventional radiology nurse under my direct supervision. PROCEDURE: The procedure, risks, benefits, and alternatives were explained to the patient. Questions regarding the procedure were encouraged and answered. The patient understands and consents to the procedure. previous imaging reviewed. patient positioned right side down decubitus. noncontrast localization ct performed. the right adrenal mass was localized and marked for a posterior paraspinous approach. under sterile conditions and local anesthesia, a 17 gauge 11.8 cm guide was advanced from a posterior approach to the right adrenal mass. Needle position confirmed with CT. 18 gauge core biopsies obtained. Samples placed  in formalin. Sampling was strandy and fragmented. Needle removed. Postprocedure imaging demonstrates a trace amount of surrounding retroperitoneal hemorrhage. Patient tolerated the procedure well without complication. Vital sign monitoring by nursing staff during the procedure will continue as patient is in the special procedures unit for post procedure observation. FINDINGS: The images document guide needle placement within the right adrenal mass. Post biopsy images demonstrate trace surrounding retroperitoneal hemorrhage. COMPLICATIONS: None immediate. IMPRESSION: Successful CT-guided core biopsy of the right adrenal mass Electronically Signed   By: [name].  Shick M.D.   On: 05/06/2020 13:42   DG Chest Portable 1 View  Result Date: 05/12/2020 CLINICAL DATA:  Weakness and tachycardia EXAM: PORTABLE CHEST 1 VIEW COMPARISON:  04/29/2020 FINDINGS: Cardiac shadows within normal limits. Lungs are well aerated bilaterally. Persistent central mass is noted along the minor fissure stable from the prior exam. Additional and nodule is noted in the right upper lobe stable from the prior CT examination. No other focal abnormality is seen. IMPRESSION: Stable right-sided lung masses. Electronically Signed   By: Inez Catalina M.D.   On: 05/12/2020 03:11   ECHOCARDIOGRAM COMPLETE  Result Date: 05/12/2020    ECHOCARDIOGRAM REPORT   Patient Name:   [facility] Date of Exam: 05/12/2020 Medical Rec #:  [id number]     Height:       71.5 in Accession #:    [id number]    Weight:  234.0 lb Date of Birth:  12/16/1960     BSA:          2.266 m Patient Age:    89 years      BP:           100/78 mmHg Patient Gender: M             HR:           116 bpm. Exam Location:  Inpatient Procedure: 2D Echo, Cardiac Doppler and Color Doppler Indications:    Atrial fibrillation  History:        Patient has no prior history of Echocardiogram examinations.                 Arrythmias:Atrial Fibrillation; Signs/Symptoms:Lung cancer,                  chemo, radiation therapy.  Sonographer:    Dustin Flock Referring Phys: [id number] [place] Comments: Image acquisition challenging due to respiratory motion. IMPRESSIONS  1. Left ventricular ejection fraction, by estimation, is 60 to 65%. The left ventricle has normal function. The left ventricle has no regional wall motion abnormalities. Left ventricular diastolic parameters are indeterminate.  2. Right ventricular systolic function is normal. The right ventricular size is normal. Tricuspid regurgitation signal is inadequate for assessing PA pressure.  3. The mitral valve is normal in structure. Trivial mitral valve regurgitation. No evidence of mitral stenosis.  4. The aortic valve is tricuspid. Aortic valve regurgitation is not visualized. No aortic stenosis is present.  5. The inferior vena cava is normal in size with greater than 50% respiratory variability, suggesting right atrial pressure of 3 mmHg.  6. The patient is in atrial fibrillation. FINDINGS  Left Ventricle: Left ventricular ejection fraction, by estimation, is 60 to 65%. The left ventricle has normal function. The left ventricle has no regional wall motion abnormalities. The left ventricular internal cavity size was normal in size. There is  no left ventricular hypertrophy. Left ventricular diastolic parameters are indeterminate. Right Ventricle: The right ventricular size is normal. No increase in right ventricular wall thickness. Right ventricular systolic function is normal. Tricuspid regurgitation signal is inadequate for assessing PA pressure. Left Atrium: Left atrial size was normal in size. Right Atrium: Right atrial size was normal in size. Pericardium: There is no evidence of pericardial effusion. Mitral Valve: The mitral valve is normal in structure. Trivial mitral valve regurgitation. No evidence of mitral valve stenosis. Tricuspid Valve: The tricuspid valve is normal in structure. Tricuspid valve regurgitation  is not demonstrated. Aortic Valve: The aortic valve is tricuspid. Aortic valve regurgitation is not visualized. No aortic stenosis is present. Pulmonic Valve: The pulmonic valve was normal in structure. Pulmonic valve regurgitation is not visualized. Aorta: The aortic root is normal in size and structure. Venous: The inferior vena cava is normal in size with greater than 50% respiratory variability, suggesting right atrial pressure of 3 mmHg. IAS/Shunts: No atrial level shunt detected by color flow Doppler.  LEFT VENTRICLE PLAX 2D LVIDd:         5.30 cm  Diastology LVIDs:         3.90 cm  LV e' medial:    6.74 cm/s LV PW:         1.10 cm  LV E/e' medial:  11.4 LV IVS:        0.90 cm  LV e' lateral:   6.85 cm/s LVOT diam:     2.20 cm  LV E/e' lateral: 11.2 LV SV:         73 LV SV Index:   32 LVOT Area:     3.80 cm  RIGHT VENTRICLE RV Basal diam:  2.40 cm RV S prime:     9.68 cm/s TAPSE (M-mode): 2.4 cm LEFT ATRIUM             Index       RIGHT ATRIUM           Index LA diam:        3.40 cm 1.50 cm/m  RA Area:     14.40 cm LA Vol (A2C):   48.1 ml 21.23 ml/m RA Volume:   39.50 ml  17.43 ml/m LA Vol (A4C):   39.6 ml 17.48 ml/m LA Biplane Vol: 45.1 ml 19.90 ml/m  AORTIC VALVE LVOT Vmax:   111.00 cm/s LVOT Vmean:  67.600 cm/s LVOT VTI:    0.191 m  AORTA Ao Root diam: 3.50 cm MITRAL VALVE MV Area (PHT): 5.54 cm    SHUNTS MV Decel Time: 137 msec    Systemic VTI:  0.19 m MV E velocity: 76.70 cm/s  Systemic Diam: 2.20 cm MV A velocity: 65.10 cm/s MV E/A ratio:  1.18 Loralie Champagne MD Electronically signed by Loralie Champagne MD Signature Date/Time: 05/12/2020/3:33:39 PM    Final    VAS [place] UPPER EXTREMITY VENOUS DUPLEX  Result Date: 05/16/2020 UPPER VENOUS STUDY  Indications: Swelling, and Recent IV attempted in RUE Risk Factors: Cancer Recently DX'd with Lung CA. Comparison Study: No previous exams Performing Technologist: Rogelia Rohrer  Examination Guidelines: A complete evaluation includes B-mode imaging, spectral  Doppler, color Doppler, and power Doppler as needed of all accessible portions of each vessel. Bilateral testing is considered an integral part of a complete examination. Limited examinations for reoccurring indications may be performed as noted.  Right Findings: +----------+------------+---------+-----------+----------+-------+ RIGHT     CompressiblePhasicitySpontaneousPropertiesSummary +----------+------------+---------+-----------+----------+-------+ IJV           Full       Yes       Yes                      +----------+------------+---------+-----------+----------+-------+ Subclavian    Full       Yes       Yes                      +----------+------------+---------+-----------+----------+-------+ Axillary      Full       Yes       Yes                      +----------+------------+---------+-----------+----------+-------+ Brachial      Full       Yes       Yes                      +----------+------------+---------+-----------+----------+-------+ Radial        Full                                          +----------+------------+---------+-----------+----------+-------+ Ulnar         Full                                          +----------+------------+---------+-----------+----------+-------+  Cephalic      Full                                          +----------+------------+---------+-----------+----------+-------+ Basilic       Full       Yes       Yes                      +----------+------------+---------+-----------+----------+-------+ Subcutaneous edema seen from area of brachial vein into forearm.  Left Findings: +----------+------------+---------+-----------+----------+-------+ LEFT      CompressiblePhasicitySpontaneousPropertiesSummary +----------+------------+---------+-----------+----------+-------+ Subclavian    Full       Yes       Yes                       +----------+------------+---------+-----------+----------+-------+  Summary:  Right: No evidence of deep vein thrombosis in the upper extremity. No evidence of superficial vein thrombosis in the upper extremity.  Left: No evidence of thrombosis in the subclavian.  *See table(s) above for measurements and observations.  Diagnosing physician: Monica Martinez MD Electronically signed by Monica Martinez MD on 05/16/2020 at 1:45:52 PM.    Final     ASSESSMENT AND PLAN: This is a very pleasant 60 years old white male recently diagnosed with a stage IV (T4, N2, M1 C) non-small cell lung cancer, squamous cell carcinoma presented with large right upper lobe lung mass in addition to a satellite nodule in the right upper lobe and few other subcentimeter nodules in addition to right hilar and mediastinal lymphadenopathy and suspicious right adrenal gland mass diagnosed in February 2022.  He has PD-L1 expression of 70%. The patient is currently undergoing palliative radiotherapy to the large right upper lobe lung mass and mediastinal lymphadenopathy. The patient started systemic chemotherapy with carboplatin for AUC of 5, paclitaxel 175 mg/M2 and Keytruda 200 mg IV every 3 weeks status post 1 cycle.  He has a rough time with the first cycle of his treatment with admission with CHF and atrial fibrillation.  He is still recovering from his recent hospitalization. I recommended for the patient to delay the start of cycle number 2 by 1 week. The patient agreed to the current plan. For the rheumatoid arthritis, it would be okay for him to continue his treatment with prednisone as long as the dose is 10 mg or less daily. I offered him IV fluid today but he declined. He will come back for follow-up visit next week for evaluation before starting cycle #2. He was advised to call immediately if he has any concerning symptoms in the interval. The patient voices understanding of current disease status and treatment options and  is in agreement with the current care plan.  All questions were answered. The patient knows to call the clinic with any problems, questions or concerns. We can certainly see the patient much sooner if necessary.  The total time spent in the appointment was 55 minutes.  Disclaimer: This note was dictated with voice recognition software. Similar sounding words can inadvertently be transcribed and may not be corrected upon review.

## 2020-05-27 ENCOUNTER — Ambulatory Visit: Payer: 59

## 2020-05-28 ENCOUNTER — Inpatient Hospital Stay: Payer: 59

## 2020-05-28 ENCOUNTER — Telehealth: Payer: Self-pay | Admitting: Internal Medicine

## 2020-05-28 ENCOUNTER — Ambulatory Visit: Payer: 59

## 2020-05-28 NOTE — Telephone Encounter (Signed)
Scheduled per los. Called and spoke with patient. Confirmed appt 

## 2020-05-28 NOTE — Progress Notes (Signed)
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 He was diagnosed in February 2022.  He also has rheumatoid arthritis.  PDL1:70%  PRIOR THERAPY: Palliative radiotherapy to the large right upper lobe lung mass and mediastinal lymphadenopathy under the care of Dr. [name]  CURRENT THERAPY: Systemic chemotherapy with carboplatin for AUC of 5, paclitaxel 175 mg/M2 and Keytruda 200 mg IV every 3 weeks with Neulasta support.  First dose May 05, 2020.  Status post 1 cycle.  INTERVAL HISTORY: John [name] 60 y.o. male returns to the clinic today for a follow-up visit. The patient underwent 1 cycle of chemotherapy on 05/05/2020.  In the interval since receiving his first cycle of chemotherapy, the patient presented to the emergency room on 05/12/2020 with a chief complaint of weakness.  The patient was hospitalized and was found to have A. fib with RVR.  He also had febrile neutropenia and C. difficile for which he was treated. He completed his antibiotics.   The patient returned to clinic for cycle #2 on 05/26/20.  At that time, the patient was still completing his course of antibiotics for his C. Difficile.  Therefore, the patient's treatment was delayed by 1 week. His diarrhea has improved at this time.   Otherwise the patient is feeling much better than last week but he still reports weakness. He denies any fevers, chills, night sweats, or weight loss since last week.  He reports on 05/31/20 he had some left lower rib pain that was sharp. It hurt with leaning on his rib. No traumas or injuries. No calf pain  or swelling. The patient is no longer in afib. Denis associated cough or shortness of breath. He tried taking tylenol without much improvement. His pain is better today and he rates it a 2-3/10.  He denies any chest  cough or hemoptysis.  He denies any nausea, vomiting, or constipation.  The patient denies any headache or visual changes.  The patient denies any rashes or skin changes.  The patient is here today for reevaluation before considering cycle #2.  MEDICAL HISTORY: Past Medical History:  Diagnosis Date  . Dyspnea   . Family history of adverse reaction to anesthesia    brother with seizures had episode under anesthesia.  Patient has seizures as well.  [name] GERD (gastroesophageal reflux disease)   . Rheumatoid aortitis   . Squamous cell lung cancer (HCC)     ALLERGIES:  has No Known Allergies.  MEDICATIONS:  Current Outpatient Medications  Medication Sig Dispense Refill  . filgrastim-sndz (ZARXIO) 480 MCG/0.8ML SOSY injection Injection 0.8 ml daily for 3 days 2.4 mL 0  . acetaminophen (TYLENOL) 500 MG tablet Take 500 mg by mouth every 6 (six) hours as needed for mild pain or moderate pain.    [name] amiodarone (PACERONE) 200 MG tablet Take 2 tablets twice daily for 7 days then 1 tablet once daily 60 tablet 1  . CARBOplatin 1000 MG/100ML SOLN Inject into the vein.    [name] leflunomide (ARAVA) 20 MG tablet Take 1 tablet (20 mg total) by mouth at bedtime. MAY RESUME AFTER COMPLETING TREATMENT FOR C-DIFF    . metoprolol  tartrate (LOPRESSOR) 25 MG tablet Take 1 tablet (25 mg total) by mouth 2 (two) times daily. 60 tablet 1  . PACLitaxel 100 MG/16.67ML CONC Inject into the vein.    [name] PARoxetine (PAXIL) 10 MG tablet Take 10 mg by mouth daily.    . pegfilgrastim-bmez (ZIEXTENZO) 6 MG/0.6ML injection Inject 0.6 mL into the skin 2 days after chemotherapy 0.6 mL 3  . PRILOSEC OTC 20 MG tablet Take 20 mg by mouth at bedtime.    . prochlorperazine (COMPAZINE) 10 MG tablet Take 1 tablet (10 mg total) by  mouth every 6 (six) hours as needed for nausea or vomiting. 30 tablet 0  . SIMPONI 50 MG/0.5ML SOAJ Inject 50 mg into the skin every 30 (thirty) days. MAY RESUME AFTER COMPLETING TREATMENT FOR C-DIFF 0.3 mL   . sodium chloride 0.9 % SOLN 50 mL with pembrolizumab 100 MG/4ML SOLN 2 mg/kg Inject 2 mg/kg into the vein.     No current facility-administered medications for this visit.    SURGICAL HISTORY:  Past Surgical History:  Procedure Laterality Date  . BRONCHIAL BRUSHINGS  03/27/2020   Procedure: BRONCHIAL BRUSHINGS;  Surgeon: [name], MD;  Location: [facility] ENDOSCOPY;  Service: Cardiopulmonary;;  . CATARACT EXTRACTION  2016   at [facility]  . FINE NEEDLE ASPIRATION  03/27/2020   Procedure: FINE NEEDLE ASPIRATION;  Surgeon: [name], MD;  Location: [facility] ENDOSCOPY;  Service: Cardiopulmonary;;  . HIP SURGERY Left   . VIDEO BRONCHOSCOPY WITH ENDOBRONCHIAL ULTRASOUND N/A 03/27/2020   Procedure: VIDEO BRONCHOSCOPY WITH ENDOBRONCHIAL ULTRASOUND;  Surgeon: [name], MD;  Location: [facility];  Service: Cardiopulmonary;  Laterality: N/A;    REVIEW OF SYSTEMS:   Review of Systems  Constitutional: Positive for fatigue and generalized weakness. Negative for appetite change, chills, fever and unexpected weight change.  HENT: Negative for mouth sores, nosebleeds, sore throat and trouble swallowing.   Eyes: Negative for eye problems and icterus.  Respiratory: Positive for baseline dyspnea on exertion. Negative for cough, hemoptysis, and wheezing.   Cardiovascular: Positive for left lower rib pain. Negative for leg swelling.  Gastrointestinal: Negative for abdominal pain, constipation, diarrhea, nausea and vomiting.  Genitourinary: Negative for bladder incontinence, difficulty urinating, dysuria, frequency and hematuria.   Musculoskeletal: Negative for back pain, gait problem, neck pain and neck stiffness.  Skin: Negative for itching and rash.  Neurological: Negative for dizziness, extremity  weakness, gait problem, headaches, light-headedness and seizures.  Hematological: Negative for adenopathy. Does not bruise/bleed easily.  Psychiatric/Behavioral: Negative for confusion, depression and sleep disturbance. The patient is not nervous/anxious.     PHYSICAL EXAMINATION:  Blood pressure 106/75, pulse 99, temperature 98.9 F (37.2 C), temperature source Oral, resp. rate 16, height 5' 11.5" (1.816 m), weight 220 lb 14.4 oz (100.2 kg), SpO2 99 %.  ECOG PERFORMANCE STATUS: 1 - Symptomatic but completely ambulatory  Physical Exam  Constitutional: Oriented to person, place, and time and well-developed, well-nourished, and in no distress.  HENT:  Head: Normocephalic and atraumatic.  Mouth/Throat: Oropharynx is clear and moist. No oropharyngeal exudate.  Eyes: Conjunctivae are normal. Right eye exhibits no discharge. Left eye exhibits no discharge. No scleral icterus.  Neck: Normal range of motion. Neck supple.  Cardiovascular: Normal rate, regular rhythm, normal heart sounds and intact distal pulses.   Pulmonary/Chest: Effort normal and breath sounds normal. No respiratory distress. No wheezes. No rales.  Abdominal: Soft. Bowel sounds are normal. Exhibits no distension and no mass. There is no tenderness.  Musculoskeletal: Mild tenderness  the left lower rib cage. Normal range of motion. Exhibits no edema.  Lymphadenopathy:    No cervical adenopathy.  Neurological: Alert and oriented to person, place, and time. Exhibits normal muscle tone. Gait normal. Coordination normal.  Skin: Skin is warm and dry. No rash noted. Not diaphoretic. No erythema. No pallor.  Psychiatric: Mood, memory and judgment normal.  Vitals reviewed.  LABORATORY DATA: Lab Results  Component Value Date   WBC 2.5 (L) 06/02/2020   HGB 10.2 (L) 06/02/2020   HCT 31.4 (L) 06/02/2020   MCV 86.7 06/02/2020   PLT 166 06/02/2020      Chemistry      Component Value Date/Time   NA 138 06/02/2020 0802   K 4.0  06/02/2020 0802   CL 102 06/02/2020 0802   CO2 24 06/02/2020 0802   BUN 12 06/02/2020 0802   CREATININE 1.10 06/02/2020 0802      Component Value Date/Time   CALCIUM 8.8 (L) 06/02/2020 0802   ALKPHOS 113 06/02/2020 0802   AST 25 06/02/2020 0802   ALT 17 06/02/2020 0802   BILITOT 1.0 06/02/2020 0802       RADIOGRAPHIC STUDIES:  CT Angio Chest PE W and/or Wo Contrast  Result Date: 05/12/2020 CLINICAL DATA:  Lung cancer following initiation of chemotherapy 1 week prior., Elevated D-dimer, weakness. EXAM: CT ANGIOGRAPHY CHEST WITH CONTRAST TECHNIQUE: Multidetector CT imaging of the chest was performed using the standard protocol during bolus administration of intravenous contrast. Multiplanar CT image reconstructions and MIPs were obtained to evaluate the vascular anatomy. CONTRAST:  172m OMNIPAQUE IOHEXOL 350 MG/ML SOLN COMPARISON:  None. FINDINGS: Cardiovascular: There is adequate opacification of the pulmonary arterial tree. There is an infra luminal filling defect identified within the right lower lobe pulmonary artery which is eccentric and appears confluent with the adjacent pulmonary mass and likely reflects either direct invasion of the neoplasm into the right lower lobar pulmonary artery or mural irregularity secondary to neoplastic infiltration with secondary in situ thrombosis. No intraluminal filling defect, however, is identified to suggest pulmonary thromboembolism. The central pulmonary arteries are of normal caliber. No significant coronary artery calcification. Global cardiac size within normal limits. No pericardial effusion. Mild atherosclerotic calcification within the thoracic aorta. No aortic aneurysm. Mediastinum/Nodes: Thyroid unremarkable. As noted on prior PET CT examination of 04/29/2020, the mass is confluent with the right hilum. No additional pathologic thoracic adenopathy. The esophagus is unremarkable. Lungs/Pleura: Hypermetabolic cavitary mass is again identified  conflict with right hila measuring roughly 4.5 x 7.1 cm at axial image # 59/6. Small satellite nodule again identified within the right upper lobe peripherally at axial image # 35/6 measuring 10 mm. Lungs are otherwise clear. No pneumothorax or pleural effusion. The mass obliterates the posterior segmental bronchus of the right upper lobe. Upper Abdomen: Limited images of the upper abdomen demonstrate the right adrenal metastasis partially, which measures at least 5.0 x 6.8 cm. Cholelithiasis again noted. Musculoskeletal: No lytic or blastic bone lesions are identified. Review of the MIP images confirms the above findings. IMPRESSION: Intraluminal nonocclusive filling defect within the right lower lobe pulmonary artery in keeping with eccentric mural thrombus or tumor thrombus related to probable direct invasion of the vessel by the adjacent neoplastic mass. No CT evidence of thromboembolic disease involving the pulmonary arterial tree. Stable cavitary mass within the right upper lobe obliterating the posterior segmental bronchus of the right upper lobe in keeping with the known primary neoplasm. This appears confluent with the right hilar nodal station. Small satellite  nodule within the right upper lobe again noted. Right adrenal metastasis partially visualized. Cholelithiasis Aortic Atherosclerosis (ICD10-I70.0). Electronically Signed   By: Fidela Salisbury MD   On: 05/12/2020 04:46   CT Biopsy  Result Date: 05/06/2020 INDICATION: Right adrenal mass, history squamous cell lung carcinoma EXAM: CT-GUIDED BIOPSY RIGHT ADRENAL MASS MEDICATIONS: 1% LIDOCAINE LOCAL ANESTHESIA/SEDATION: Moderate Sedation Time:  NONE. The patient was continuously monitored during the procedure by the interventional radiology nurse under my direct supervision. PROCEDURE: The procedure, risks, benefits, and alternatives were explained to the patient. Questions regarding the procedure were encouraged and answered. The patient understands  and consents to the procedure. previous imaging reviewed. patient positioned right side down decubitus. noncontrast localization ct performed. the right adrenal mass was localized and marked for a posterior paraspinous approach. under sterile conditions and local anesthesia, a 17 gauge 11.8 cm guide was advanced from a posterior approach to the right adrenal mass. Needle position confirmed with CT. 18 gauge core biopsies obtained. Samples placed in formalin. Sampling was strandy and fragmented. Needle removed. Postprocedure imaging demonstrates a trace amount of surrounding retroperitoneal hemorrhage. Patient tolerated the procedure well without complication. Vital sign monitoring by nursing staff during the procedure will continue as patient is in the special procedures unit for post procedure observation. FINDINGS: The images document guide needle placement within the right adrenal mass. Post biopsy images demonstrate trace surrounding retroperitoneal hemorrhage. COMPLICATIONS: None immediate. IMPRESSION: Successful CT-guided core biopsy of the right adrenal mass Electronically Signed   By: [name].  Shick M.D.   On: 05/06/2020 13:42   DG Chest Portable 1 View  Result Date: 05/12/2020 CLINICAL DATA:  Weakness and tachycardia EXAM: PORTABLE CHEST 1 VIEW COMPARISON:  04/29/2020 FINDINGS: Cardiac shadows within normal limits. Lungs are well aerated bilaterally. Persistent central mass is noted along the minor fissure stable from the prior exam. Additional and nodule is noted in the right upper lobe stable from the prior CT examination. No other focal abnormality is seen. IMPRESSION: Stable right-sided lung masses. Electronically Signed   By: Inez Catalina M.D.   On: 05/12/2020 03:11   ECHOCARDIOGRAM COMPLETE  Result Date: 05/12/2020    ECHOCARDIOGRAM REPORT   Patient Name:   [facility] Date of Exam: 05/12/2020 Medical Rec #:  [id number]     Height:       71.5 in Accession #:    [id number]    Weight:       234.0 lb  Date of Birth:  08-09-1960     BSA:          2.266 m Patient Age:    52 years      BP:           100/78 mmHg Patient Gender: M             HR:           116 bpm. Exam Location:  Inpatient Procedure: 2D Echo, Cardiac Doppler and Color Doppler Indications:    Atrial fibrillation  History:        Patient has no prior history of Echocardiogram examinations.                 Arrythmias:Atrial Fibrillation; Signs/Symptoms:Lung cancer,                 chemo, radiation therapy.  Sonographer:    Dustin Flock Referring Phys: [id number] [place] Comments: Image acquisition challenging due to respiratory motion. IMPRESSIONS  1. Left ventricular ejection fraction, by estimation,  is 60 to 65%. The left ventricle has normal function. The left ventricle has no regional wall motion abnormalities. Left ventricular diastolic parameters are indeterminate.  2. Right ventricular systolic function is normal. The right ventricular size is normal. Tricuspid regurgitation signal is inadequate for assessing PA pressure.  3. The mitral valve is normal in structure. Trivial mitral valve regurgitation. No evidence of mitral stenosis.  4. The aortic valve is tricuspid. Aortic valve regurgitation is not visualized. No aortic stenosis is present.  5. The inferior vena cava is normal in size with greater than 50% respiratory variability, suggesting right atrial pressure of 3 mmHg.  6. The patient is in atrial fibrillation. FINDINGS  Left Ventricle: Left ventricular ejection fraction, by estimation, is 60 to 65%. The left ventricle has normal function. The left ventricle has no regional wall motion abnormalities. The left ventricular internal cavity size was normal in size. There is  no left ventricular hypertrophy. Left ventricular diastolic parameters are indeterminate. Right Ventricle: The right ventricular size is normal. No increase in right ventricular wall thickness. Right ventricular systolic function is normal.  Tricuspid regurgitation signal is inadequate for assessing PA pressure. Left Atrium: Left atrial size was normal in size. Right Atrium: Right atrial size was normal in size. Pericardium: There is no evidence of pericardial effusion. Mitral Valve: The mitral valve is normal in structure. Trivial mitral valve regurgitation. No evidence of mitral valve stenosis. Tricuspid Valve: The tricuspid valve is normal in structure. Tricuspid valve regurgitation is not demonstrated. Aortic Valve: The aortic valve is tricuspid. Aortic valve regurgitation is not visualized. No aortic stenosis is present. Pulmonic Valve: The pulmonic valve was normal in structure. Pulmonic valve regurgitation is not visualized. Aorta: The aortic root is normal in size and structure. Venous: The inferior vena cava is normal in size with greater than 50% respiratory variability, suggesting right atrial pressure of 3 mmHg. IAS/Shunts: No atrial level shunt detected by color flow Doppler.  LEFT VENTRICLE PLAX 2D LVIDd:         5.30 cm  Diastology LVIDs:         3.90 cm  LV e' medial:    6.74 cm/s LV PW:         1.10 cm  LV E/e' medial:  11.4 LV IVS:        0.90 cm  LV e' lateral:   6.85 cm/s LVOT diam:     2.20 cm  LV E/e' lateral: 11.2 LV SV:         73 LV SV Index:   32 LVOT Area:     3.80 cm  RIGHT VENTRICLE RV Basal diam:  2.40 cm RV S prime:     9.68 cm/s TAPSE (M-mode): 2.4 cm LEFT ATRIUM             Index       RIGHT ATRIUM           Index LA diam:        3.40 cm 1.50 cm/m  RA Area:     14.40 cm LA Vol (A2C):   48.1 ml 21.23 ml/m RA Volume:   39.50 ml  17.43 ml/m LA Vol (A4C):   39.6 ml 17.48 ml/m LA Biplane Vol: 45.1 ml 19.90 ml/m  AORTIC VALVE LVOT Vmax:   111.00 cm/s LVOT Vmean:  67.600 cm/s LVOT VTI:    0.191 m  AORTA Ao Root diam: 3.50 cm MITRAL VALVE MV Area (PHT): 5.54 cm    SHUNTS MV Decel Time:  137 msec    Systemic VTI:  0.19 m MV E velocity: 76.70 cm/s  Systemic Diam: 2.20 cm MV A velocity: 65.10 cm/s MV E/A ratio:  1.18 Loralie Champagne MD Electronically signed by Loralie Champagne MD Signature Date/Time: 05/12/2020/3:33:39 PM    Final    VAS [place] UPPER EXTREMITY VENOUS DUPLEX  Result Date: 05/16/2020 UPPER VENOUS STUDY  Indications: Swelling, and Recent IV attempted in RUE Risk Factors: Cancer Recently DX'd with Lung CA. Comparison Study: No previous exams Performing Technologist: Rogelia Rohrer  Examination Guidelines: A complete evaluation includes B-mode imaging, spectral Doppler, color Doppler, and power Doppler as needed of all accessible portions of each vessel. Bilateral testing is considered an integral part of a complete examination. Limited examinations for reoccurring indications may be performed as noted.  Right Findings: +----------+------------+---------+-----------+----------+-------+ RIGHT     CompressiblePhasicitySpontaneousPropertiesSummary +----------+------------+---------+-----------+----------+-------+ IJV           Full       Yes       Yes                      +----------+------------+---------+-----------+----------+-------+ Subclavian    Full       Yes       Yes                      +----------+------------+---------+-----------+----------+-------+ Axillary      Full       Yes       Yes                      +----------+------------+---------+-----------+----------+-------+ Brachial      Full       Yes       Yes                      +----------+------------+---------+-----------+----------+-------+ Radial        Full                                          +----------+------------+---------+-----------+----------+-------+ Ulnar         Full                                          +----------+------------+---------+-----------+----------+-------+ Cephalic      Full                                          +----------+------------+---------+-----------+----------+-------+ Basilic       Full       Yes       Yes                       +----------+------------+---------+-----------+----------+-------+ Subcutaneous edema seen from area of brachial vein into forearm.  Left Findings: +----------+------------+---------+-----------+----------+-------+ LEFT      CompressiblePhasicitySpontaneousPropertiesSummary +----------+------------+---------+-----------+----------+-------+ Subclavian    Full       Yes       Yes                      +----------+------------+---------+-----------+----------+-------+  Summary:  Right: No evidence of deep vein thrombosis in the upper extremity. No evidence of superficial  vein thrombosis in the upper extremity.  Left: No evidence of thrombosis in the subclavian.  *See table(s) above for measurements and observations.  Diagnosing physician: Monica Martinez MD Electronically signed by Monica Martinez MD on 05/16/2020 at 1:45:52 PM.    Final      ASSESSMENT/PLAN:  This is a very pleasant 60 year old white male recently diagnosed with stage IIIb/IV (T4, N2, M0/M1 C)non-small cell lung cancer, squamous cell carcinoma. 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 Diagnosed in February 2022. His PDL1 expression is 70%  The patient completed palliative radiotherapy to the large right upper lobe lung mass and mediastinal lymphadenopathy.  The patient started systemic chemotherapy with carboplatin for AUC of 5, paclitaxel 175 mg/M2 and Keytruda 200 mg IV every 3 weeks status post 1 cycle.  He has a rough time with the first cycle of his treatment with admission with CHF, C. Diff, and atrial fibrillation.  He is still recovering from his recent hospitalization.   His treatment was delayed by 1 week. He is here for evaluation and to reconsider resuming his treatment with cycle #2.   The patient was seen  by Dr. [name]. Labs were reviewed. His ANC is 0.7 today. Dr. [name] recommends delaying his treatment by another week. We will also work on getting Zarxio approved which will be given daily for 3 doses. Dr. [name] may have to consider reducing his dose of chemotherapy with cyle #2 which we will discuss at his next appointment.   We will see him back for a follow up visit in 1 week for evaluation before starting cycle #2.    The patient was advised to call immediately if he has any concerning symptoms in the interval. The patient voices understanding of current disease status and treatment options and is in agreement with the current care plan. All questions were answered. The patient knows to call the clinic with any problems, questions or concerns. We can certainly see the patient much sooner if necessary  No orders of the defined types were placed in this encounter.   Dorr Perrot L Lilybeth Vien, PA-C 06/02/20  ADDENDUM: Hematology/Oncology Attending: I had a face-to-face encounter with the patient today.  I reviewed his records, labs and recommended his care plan.  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  His PD-L1 expression is 70%.  The patient underwent palliative radiotherapy to the large right upper lobe lung mass and mediastinal lymphadenopathy under the care of Dr. [name]. He is currently undergoing systemic chemotherapy with carboplatin, paclitaxel and Keytruda status post 1 cycle. His treatment was complicated with hospitalization with atrial fibrillation as well as C. difficile and pancytopenia. He was supposed to start cycle #2 of his  treatment last week but this was delayed because of persistent neutropenia. Repeat blood work today showed persistent neutropenia.  I recommended for the patient to hold his treatment for another week and we will start him on daily subcutaneous G-CSF for the next 3 days to improve his  count and be able to treat him next week. If the patient has no improvement in his count, will consider him for treatment with single agent Keytruda especially with the high PD-L1 expression. The patient agreed to the current plan. He will come back for follow-up visit next week for evaluation before resuming his treatment. He was advised to call immediately if he has any concerning symptoms in the interval. The total time spent in the appointment was 36 minutes. Disclaimer: This note was dictated with voice recognition software. Similar sounding words can inadvertently be transcribed and may be missed upon review. Eilleen Kempf, MD 06/02/20

## 2020-05-29 ENCOUNTER — Ambulatory Visit: Payer: 59

## 2020-05-30 ENCOUNTER — Ambulatory Visit: Payer: 59

## 2020-06-02 ENCOUNTER — Telehealth: Payer: Self-pay

## 2020-06-02 ENCOUNTER — Other Ambulatory Visit: Payer: Self-pay

## 2020-06-02 ENCOUNTER — Inpatient Hospital Stay: Payer: 59 | Admitting: Physician Assistant

## 2020-06-02 ENCOUNTER — Inpatient Hospital Stay: Payer: 59

## 2020-06-02 ENCOUNTER — Ambulatory Visit: Payer: 59

## 2020-06-02 VITALS — BP 106/75 | HR 99 | Temp 98.9°F | Resp 16 | Ht 71.5 in | Wt 220.9 lb

## 2020-06-02 DIAGNOSIS — D709 Neutropenia, unspecified: Secondary | ICD-10-CM

## 2020-06-02 DIAGNOSIS — Z5112 Encounter for antineoplastic immunotherapy: Secondary | ICD-10-CM | POA: Diagnosis not present

## 2020-06-02 DIAGNOSIS — C3491 Malignant neoplasm of unspecified part of right bronchus or lung: Secondary | ICD-10-CM

## 2020-06-02 LAB — CBC WITH DIFFERENTIAL (CANCER CENTER ONLY)
Abs Immature Granulocytes: 0.01 10*3/uL (ref 0.00–0.07)
Basophils Absolute: 0 10*3/uL (ref 0.0–0.1)
Basophils Relative: 1 %
Eosinophils Absolute: 0.3 10*3/uL (ref 0.0–0.5)
Eosinophils Relative: 12 %
HCT: 31.4 % — ABNORMAL LOW (ref 39.0–52.0)
Hemoglobin: 10.2 g/dL — ABNORMAL LOW (ref 13.0–17.0)
Immature Granulocytes: 0 %
Lymphocytes Relative: 34 %
Lymphs Abs: 0.8 10*3/uL (ref 0.7–4.0)
MCH: 28.2 pg (ref 26.0–34.0)
MCHC: 32.5 g/dL (ref 30.0–36.0)
MCV: 86.7 fL (ref 80.0–100.0)
Monocytes Absolute: 0.6 10*3/uL (ref 0.1–1.0)
Monocytes Relative: 25 %
Neutro Abs: 0.7 10*3/uL — ABNORMAL LOW (ref 1.7–7.7)
Neutrophils Relative %: 28 %
Platelet Count: 166 10*3/uL (ref 150–400)
RBC: 3.62 MIL/uL — ABNORMAL LOW (ref 4.22–5.81)
RDW: 21.2 % — ABNORMAL HIGH (ref 11.5–15.5)
WBC Count: 2.5 10*3/uL — ABNORMAL LOW (ref 4.0–10.5)
nRBC: 0 % (ref 0.0–0.2)

## 2020-06-02 LAB — CMP (CANCER CENTER ONLY)
ALT: 17 U/L (ref 0–44)
AST: 25 U/L (ref 15–41)
Albumin: 3.3 g/dL — ABNORMAL LOW (ref 3.5–5.0)
Alkaline Phosphatase: 113 U/L (ref 38–126)
Anion gap: 12 (ref 5–15)
BUN: 12 mg/dL (ref 6–20)
CO2: 24 mmol/L (ref 22–32)
Calcium: 8.8 mg/dL — ABNORMAL LOW (ref 8.9–10.3)
Chloride: 102 mmol/L (ref 98–111)
Creatinine: 1.1 mg/dL (ref 0.61–1.24)
GFR, Estimated: >60 mL/min (ref >60)
Glucose, Bld: 124 mg/dL — ABNORMAL HIGH (ref 70–99)
Potassium: 4 mmol/L (ref 3.5–5.1)
Sodium: 138 mmol/L (ref 135–145)
Total Bilirubin: 1 mg/dL (ref 0.3–1.2)
Total Protein: 6.9 g/dL (ref 6.5–8.1)

## 2020-06-02 MED ORDER — ZARXIO 480 MCG/0.8ML IJ SOSY: PREFILLED_SYRINGE | INTRAMUSCULAR | 0 refills | Status: DC

## 2020-06-02 NOTE — Telephone Encounter (Signed)
I contacted [facility] to request that the patient's Zarxio be expedited overnight to the patient. I was given confirmation that the order had been placed to expedite the Zarxio to the clearance team and with the pharmacy in [place]. Auth # [id number]. I will be following up this afternoon to ensure that the medication was indeed expedited to the patient.

## 2020-06-03 ENCOUNTER — Ambulatory Visit: Payer: 59

## 2020-06-04 ENCOUNTER — Telehealth: Payer: Self-pay

## 2020-06-04 ENCOUNTER — Ambulatory Visit: Payer: 59

## 2020-06-04 NOTE — Telephone Encounter (Signed)
I contacted [facility] to follow-up on John [name]'s Zarxio prescription. I was transferred to their specialty pharmacy and was advised that they had received the prescription from [place] but they could not read the fax and did not have the patient's insurance information. I went over the Zarxio prescription information with the pharmacist and she then transferred me to another department to collect the patient's insurance. His insurance information was given to [facility] and I was informed that the claim was processed and the patient had a copay of $0.00. The agent with [facility] placed me on hold and contacted the patient to confirm delivery and to notify him that it would be shipped overnight.

## 2020-06-04 NOTE — Telephone Encounter (Signed)
On 4/18 I contacted [facility] to regards the patient's Zarzio to request that the medication be expedited overnight. The first time that I called I was advised to call [facility] back within 1-2 hours to check on the status of the prescription. When I called back the second time a representative notified me that the Zarzio was not ready to be expedited overnight because the Pharmacist needed to ask me a few questions. The representative then transferred me to their pharmacy. I was connected with a pharmacist who advised me that the prescription was ready to be expedited to the patient overnight but she needed to confirm the doses and route of administration. I confirmed with the pharmacist that this prescription was for 3 doses and it was to be administered subcutaneously. The pharmacist stated that she had the information that she needed and that [facility] would be reaching out to the patient that afternoon to schedule a delivery. I called the patient afterwards and advised him to follow up with [facility] in hopes that it would speed the process up. Patient was advised to contact us if he ran into any issues.  Patient followed-up with [place] today stating that [facility] was not going to fill the medication. I contacted [facility] today and was informed that they do not cover his Zarzio but instead the medication would be covered and sent through [facility]. This information was communicated to [facility] [place].

## 2020-06-05 ENCOUNTER — Telehealth: Payer: Self-pay | Admitting: Internal Medicine

## 2020-06-05 ENCOUNTER — Ambulatory Visit: Payer: 59

## 2020-06-05 NOTE — Telephone Encounter (Signed)
Called to inform patient of his upcoming appointments. Patient is aware.

## 2020-06-06 ENCOUNTER — Ambulatory Visit: Payer: 59

## 2020-06-06 NOTE — Progress Notes (Signed)
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 He was diagnosed in February 2022.He also has rheumatoid arthritis.  PDL1:70%  PRIOR THERAPY:  Palliative radiotherapy to the large right upper lobe lung mass and mediastinal lymphadenopathy under the care of Dr. [name]  CURRENT THERAPY: Systemic chemotherapy with carboplatin for AUC of 5, paclitaxel 175 mg/M2 and Keytruda 200 mg IV every 3 weeks with Neulasta support. First dose May 05, 2020. Status post 1 cycle. Starting from cycle #2, the patient will be on single agent immunotherapy with Keytruda due to neutropenia with chemotherapy  INTERVAL HISTORY: John [name] 60 y.o. male returns to the clinic today for a follow up visit accompanied by his wife. The patient was last seen in the clinic last week for cycle #2, which had already been delayed by 1 week due to still recovering from his hospitalization/taking anti-bioitcs for C. Diff. However, his labs showed continued neutropenia. Therefore, he received 3 injections of zarxio via mail pharmacy which he took 3 consecutive days. The last dose was on 06/07/20. This did not improve his neutropenia thus far.   He felt well on Wednesday and Thursday last week but felt weak and tired starting Saturday 06/07/20 after taking the zarxio injections. He also developed a few episodes of diarrhea yesterday. He had 3 total episodes. He denies blood in the stool. He denies nausea/vomiting. He felt clammy Saturday but denies night sweats since Saturday. He denies any fevers. He lost about 4 lbs since last week. He  denies changes with worsening shortness of breath. Last week he endorsed a left lower rib pain. He tried taking tylenol without much improvement. His pain continues to improve from last week but is not gone completely.  He denies any chest  cough or hemoptysis. The patient denies any headache or visual changes.  The patient denies any rashes or skin changes.  The patient is here today for reevaluation before considering cycle #2.   MEDICAL HISTORY: Past Medical History:  Diagnosis Date  . Dyspnea   . Family history of adverse reaction to anesthesia    brother with seizures had episode under anesthesia.  Patient has seizures as well.  [name] GERD (gastroesophageal reflux disease)   . Rheumatoid aortitis   . Squamous cell lung cancer (HCC)     ALLERGIES:  has No Known Allergies.  MEDICATIONS:  Current Outpatient Medications  Medication Sig Dispense Refill  . acetaminophen (TYLENOL) 500 MG tablet Take 500 mg by mouth every 6 (six) hours as needed for mild pain or moderate pain.    [name] amiodarone (PACERONE) 200 MG tablet Take 2 tablets twice daily for 7 days then 1 tablet once daily 60 tablet 1  . CARBOplatin 1000 MG/100ML SOLN Inject into the vein.    Karle Barr (ZARXIO) 480 MCG/0.8ML SOSY injection Injection 0.8 ml daily for 3 days 2.4 mL 0  . leflunomide (ARAVA) 20 MG tablet Take 1 tablet (20 mg total) by mouth at bedtime. MAY RESUME AFTER COMPLETING TREATMENT FOR C-DIFF    . metoprolol tartrate (LOPRESSOR) 25 MG tablet Take 1 tablet (25 mg total) by mouth 2 (two) times daily. 60 tablet  1  . PACLitaxel 100 MG/16.67ML CONC Inject into the vein.    [name] PARoxetine (PAXIL) 10 MG tablet Take 10 mg by mouth daily.    . pegfilgrastim-bmez (ZIEXTENZO) 6 MG/0.6ML injection Inject 0.6 mL into the skin 2 days after chemotherapy 0.6 mL 3  . PRILOSEC OTC 20 MG tablet Take 20 mg by mouth at bedtime.    . prochlorperazine (COMPAZINE) 10 MG tablet Take 1 tablet (10 mg total) by mouth every 6 (six) hours as  needed for nausea or vomiting. 30 tablet 0  . SIMPONI 50 MG/0.5ML SOAJ Inject 50 mg into the skin every 30 (thirty) days. MAY RESUME AFTER COMPLETING TREATMENT FOR C-DIFF 0.3 mL   . sodium chloride 0.9 % SOLN 50 mL with pembrolizumab 100 MG/4ML SOLN 2 mg/kg Inject 2 mg/kg into the vein.     No current facility-administered medications for this visit.    SURGICAL HISTORY:  Past Surgical History:  Procedure Laterality Date  . BRONCHIAL BRUSHINGS  03/27/2020   Procedure: BRONCHIAL BRUSHINGS;  Surgeon: [name], MD;  Location: [facility] ENDOSCOPY;  Service: Cardiopulmonary;;  . CATARACT EXTRACTION  2016   at [facility]  . FINE NEEDLE ASPIRATION  03/27/2020   Procedure: FINE NEEDLE ASPIRATION;  Surgeon: [name], MD;  Location: [facility] ENDOSCOPY;  Service: Cardiopulmonary;;  . HIP SURGERY Left   . VIDEO BRONCHOSCOPY WITH ENDOBRONCHIAL ULTRASOUND N/A 03/27/2020   Procedure: VIDEO BRONCHOSCOPY WITH ENDOBRONCHIAL ULTRASOUND;  Surgeon: [name], MD;  Location: [facility];  Service: Cardiopulmonary;  Laterality: N/A;    REVIEW OF SYSTEMS:   Review of Systems  Constitutional: Positive for fatigue and generalized weakness. Negative for appetite change, chills, fever and unexpected weight change.  HENT: Negative for mouth sores, nosebleeds, sore throat and trouble swallowing.   Eyes: Negative for eye problems and icterus.  Respiratory: Positive for baseline dyspnea on exertion. Negative for cough, hemoptysis, and wheezing.   Cardiovascular: Positive for left lower rib pain (improving). Negative for leg swelling.  Gastrointestinal: Positive for diarrhea. Negative for abdominal pain, constipation, nausea and vomiting.  Genitourinary: Negative for bladder incontinence, difficulty urinating, dysuria, frequency and hematuria.   Musculoskeletal: Negative for back pain, gait problem, neck pain and neck stiffness.  Skin: Negative for itching and rash.  Neurological: Negative for dizziness, extremity  weakness, gait problem, headaches, light-headedness and seizures.  Hematological: Negative for adenopathy. Does not bruise/bleed easily.  Psychiatric/Behavioral: Negative for confusion, depression and sleep disturbance. The patient is not nervous/anxious.   PHYSICAL EXAMINATION:  Blood pressure 111/81, pulse (!) 105, temperature (!) 97.3 F (36.3 C), temperature source Tympanic, resp. rate 18, weight 216 lb 11.2 oz (98.3 kg), SpO2 98 %.  ECOG PERFORMANCE STATUS: 1 - Symptomatic but completely ambulatory  Physical Exam  Constitutional: Oriented to person, place, and time and well-developed, well-nourished, and in no distress.  HENT:  Head: Normocephalic and atraumatic.  Mouth/Throat: Oropharynx is clear and moist. No oropharyngeal exudate.  Eyes: Conjunctivae are normal. Right eye exhibits no discharge. Left eye exhibits no discharge. No scleral icterus.  Neck: Normal range of motion. Neck supple.  Cardiovascular: Normal rate, regular rhythm, normal heart sounds and intact distal pulses.   Pulmonary/Chest: Effort normal and breath sounds normal. No respiratory distress. No wheezes. No rales.  Abdominal: Soft. Bowel sounds are normal. Exhibits no distension and no mass. There is no tenderness.  Musculoskeletal: Mild tenderness the left lower rib cage. Normal range of motion. Exhibits no edema.  Lymphadenopathy:    No cervical adenopathy.  Neurological: Alert and oriented to person, place, and time. Exhibits normal muscle tone. Gait normal. Coordination normal.  Skin: Skin is warm and dry. No rash noted. Not diaphoretic. No erythema. No pallor.  Psychiatric: Mood, memory and judgment normal.  Vitals reviewed.  LABORATORY DATA: Lab Results  Component Value Date   WBC 2.7 (L) 06/09/2020   HGB 9.6 (L) 06/09/2020   HCT 28.9 (L) 06/09/2020   MCV 87.3 06/09/2020   PLT 158 06/09/2020      Chemistry      Component Value Date/Time   NA 132 (L) 06/09/2020 1303   K 3.7 06/09/2020 1303    CL 98 06/09/2020 1303   CO2 23 06/09/2020 1303   BUN 15 06/09/2020 1303   CREATININE 1.01 06/09/2020 1303      Component Value Date/Time   CALCIUM 8.9 06/09/2020 1303   ALKPHOS 128 (H) 06/09/2020 1303   AST 27 06/09/2020 1303   ALT 19 06/09/2020 1303   BILITOT 1.4 (H) 06/09/2020 1303       RADIOGRAPHIC STUDIES:  CT Angio Chest PE W and/or Wo Contrast  Result Date: 05/12/2020 CLINICAL DATA:  Lung cancer following initiation of chemotherapy 1 week prior., Elevated D-dimer, weakness. EXAM: CT ANGIOGRAPHY CHEST WITH CONTRAST TECHNIQUE: Multidetector CT imaging of the chest was performed using the standard protocol during bolus administration of intravenous contrast. Multiplanar CT image reconstructions and MIPs were obtained to evaluate the vascular anatomy. CONTRAST:  153m OMNIPAQUE IOHEXOL 350 MG/ML SOLN COMPARISON:  None. FINDINGS: Cardiovascular: There is adequate opacification of the pulmonary arterial tree. There is an infra luminal filling defect identified within the right lower lobe pulmonary artery which is eccentric and appears confluent with the adjacent pulmonary mass and likely reflects either direct invasion of the neoplasm into the right lower lobar pulmonary artery or mural irregularity secondary to neoplastic infiltration with secondary in situ thrombosis. No intraluminal filling defect, however, is identified to suggest pulmonary thromboembolism. The central pulmonary arteries are of normal caliber. No significant coronary artery calcification. Global cardiac size within normal limits. No pericardial effusion. Mild atherosclerotic calcification within the thoracic aorta. No aortic aneurysm. Mediastinum/Nodes: Thyroid unremarkable. As noted on prior PET CT examination of 04/29/2020, the mass is confluent with the right hilum. No additional pathologic thoracic adenopathy. The esophagus is unremarkable. Lungs/Pleura: Hypermetabolic cavitary mass is again identified conflict with right  hila measuring roughly 4.5 x 7.1 cm at axial image # 59/6. Small satellite nodule again identified within the right upper lobe peripherally at axial image # 35/6 measuring 10 mm. Lungs are otherwise clear. No pneumothorax or pleural effusion. The mass obliterates the posterior segmental bronchus of the right upper lobe. Upper Abdomen: Limited images of the upper abdomen demonstrate the right adrenal metastasis partially, which measures at least 5.0 x 6.8 cm. Cholelithiasis again noted. Musculoskeletal: No lytic or blastic bone lesions are identified. Review of the MIP images confirms the above findings. IMPRESSION: Intraluminal nonocclusive filling defect within the right lower lobe pulmonary artery in keeping with eccentric mural thrombus or tumor thrombus related to probable direct invasion of the vessel by the adjacent neoplastic mass. No CT evidence of thromboembolic disease involving the pulmonary arterial tree. Stable cavitary mass within the right upper lobe obliterating the posterior segmental bronchus of the right upper lobe in keeping with the known primary neoplasm. This appears confluent with the right hilar nodal station. Small satellite nodule within the right upper lobe again noted. Right adrenal metastasis partially visualized. Cholelithiasis Aortic Atherosclerosis (ICD10-I70.0). Electronically Signed  By: Fidela Salisbury MD   On: 05/12/2020 04:46   DG Chest Portable 1 View  Result Date: 05/12/2020 CLINICAL DATA:  Weakness and tachycardia EXAM: PORTABLE CHEST 1 VIEW COMPARISON:  04/29/2020 FINDINGS: Cardiac shadows within normal limits. Lungs are well aerated bilaterally. Persistent central mass is noted along the minor fissure stable from the prior exam. Additional and nodule is noted in the right upper lobe stable from the prior CT examination. No other focal abnormality is seen. IMPRESSION: Stable right-sided lung masses. Electronically Signed   By: Inez Catalina M.D.   On: 05/12/2020 03:11    ECHOCARDIOGRAM COMPLETE  Result Date: 05/12/2020    ECHOCARDIOGRAM REPORT   Patient Name:   [facility] Date of Exam: 05/12/2020 Medical Rec #:  [id number]     Height:       71.5 in Accession #:    [id number]    Weight:       234.0 lb Date of Birth:  01/10/1961     BSA:          2.266 m Patient Age:    74 years      BP:           100/78 mmHg Patient Gender: M             HR:           116 bpm. Exam Location:  Inpatient Procedure: 2D Echo, Cardiac Doppler and Color Doppler Indications:    Atrial fibrillation  History:        Patient has no prior history of Echocardiogram examinations.                 Arrythmias:Atrial Fibrillation; Signs/Symptoms:Lung cancer,                 chemo, radiation therapy.  Sonographer:    Dustin Flock Referring Phys: [id number] Buck[place] Comments: Image acquisition challenging due to respiratory motion. IMPRESSIONS  1. Left ventricular ejection fraction, by estimation, is 60 to 65%. The left ventricle has normal function. The left ventricle has no regional wall motion abnormalities. Left ventricular diastolic parameters are indeterminate.  2. Right ventricular systolic function is normal. The right ventricular size is normal. Tricuspid regurgitation signal is inadequate for assessing PA pressure.  3. The mitral valve is normal in structure. Trivial mitral valve regurgitation. No evidence of mitral stenosis.  4. The aortic valve is tricuspid. Aortic valve regurgitation is not visualized. No aortic stenosis is present.  5. The inferior vena cava is normal in size with greater than 50% respiratory variability, suggesting right atrial pressure of 3 mmHg.  6. The patient is in atrial fibrillation. FINDINGS  Left Ventricle: Left ventricular ejection fraction, by estimation, is 60 to 65%. The left ventricle has normal function. The left ventricle has no regional wall motion abnormalities. The left ventricular internal cavity size was normal in size. There is  no  left ventricular hypertrophy. Left ventricular diastolic parameters are indeterminate. Right Ventricle: The right ventricular size is normal. No increase in right ventricular wall thickness. Right ventricular systolic function is normal. Tricuspid regurgitation signal is inadequate for assessing PA pressure. Left Atrium: Left atrial size was normal in size. Right Atrium: Right atrial size was normal in size. Pericardium: There is no evidence of pericardial effusion. Mitral Valve: The mitral valve is normal in structure. Trivial mitral valve regurgitation. No evidence of mitral valve stenosis. Tricuspid Valve: The tricuspid valve is normal in structure. Tricuspid valve regurgitation is not  demonstrated. Aortic Valve: The aortic valve is tricuspid. Aortic valve regurgitation is not visualized. No aortic stenosis is present. Pulmonic Valve: The pulmonic valve was normal in structure. Pulmonic valve regurgitation is not visualized. Aorta: The aortic root is normal in size and structure. Venous: The inferior vena cava is normal in size with greater than 50% respiratory variability, suggesting right atrial pressure of 3 mmHg. IAS/Shunts: No atrial level shunt detected by color flow Doppler.  LEFT VENTRICLE PLAX 2D LVIDd:         5.30 cm  Diastology LVIDs:         3.90 cm  LV e' medial:    6.74 cm/s LV PW:         1.10 cm  LV E/e' medial:  11.4 LV IVS:        0.90 cm  LV e' lateral:   6.85 cm/s LVOT diam:     2.20 cm  LV E/e' lateral: 11.2 LV SV:         73 LV SV Index:   32 LVOT Area:     3.80 cm  RIGHT VENTRICLE RV Basal diam:  2.40 cm RV S prime:     9.68 cm/s TAPSE (M-mode): 2.4 cm LEFT ATRIUM             Index       RIGHT ATRIUM           Index LA diam:        3.40 cm 1.50 cm/m  RA Area:     14.40 cm LA Vol (A2C):   48.1 ml 21.23 ml/m RA Volume:   39.50 ml  17.43 ml/m LA Vol (A4C):   39.6 ml 17.48 ml/m LA Biplane Vol: 45.1 ml 19.90 ml/m  AORTIC VALVE LVOT Vmax:   111.00 cm/s LVOT Vmean:  67.600 cm/s LVOT VTI:     0.191 m  AORTA Ao Root diam: 3.50 cm MITRAL VALVE MV Area (PHT): 5.54 cm    SHUNTS MV Decel Time: 137 msec    Systemic VTI:  0.19 m MV E velocity: 76.70 cm/s  Systemic Diam: 2.20 cm MV A velocity: 65.10 cm/s MV E/A ratio:  1.18 Loralie Champagne MD Electronically signed by Loralie Champagne MD Signature Date/Time: 05/12/2020/3:33:39 PM    Final    VAS [place] UPPER EXTREMITY VENOUS DUPLEX  Result Date: 05/16/2020 UPPER VENOUS STUDY  Indications: Swelling, and Recent IV attempted in RUE Risk Factors: Cancer Recently DX'd with Lung CA. Comparison Study: No previous exams Performing Technologist: Rogelia Rohrer  Examination Guidelines: A complete evaluation includes B-mode imaging, spectral Doppler, color Doppler, and power Doppler as needed of all accessible portions of each vessel. Bilateral testing is considered an integral part of a complete examination. Limited examinations for reoccurring indications may be performed as noted.  Right Findings: +----------+------------+---------+-----------+----------+-------+ RIGHT     CompressiblePhasicitySpontaneousPropertiesSummary +----------+------------+---------+-----------+----------+-------+ IJV           Full       Yes       Yes                      +----------+------------+---------+-----------+----------+-------+ Subclavian    Full       Yes       Yes                      +----------+------------+---------+-----------+----------+-------+ Axillary      Full       Yes       Yes                      +----------+------------+---------+-----------+----------+-------+  Brachial      Full       Yes       Yes                      +----------+------------+---------+-----------+----------+-------+ Radial        Full                                          +----------+------------+---------+-----------+----------+-------+ Ulnar         Full                                           +----------+------------+---------+-----------+----------+-------+ Cephalic      Full                                          +----------+------------+---------+-----------+----------+-------+ Basilic       Full       Yes       Yes                      +----------+------------+---------+-----------+----------+-------+ Subcutaneous edema seen from area of brachial vein into forearm.  Left Findings: +----------+------------+---------+-----------+----------+-------+ LEFT      CompressiblePhasicitySpontaneousPropertiesSummary +----------+------------+---------+-----------+----------+-------+ Subclavian    Full       Yes       Yes                      +----------+------------+---------+-----------+----------+-------+  Summary:  Right: No evidence of deep vein thrombosis in the upper extremity. No evidence of superficial vein thrombosis in the upper extremity.  Left: No evidence of thrombosis in the subclavian.  *See table(s) above for measurements and observations.  Diagnosing physician: Monica Martinez MD Electronically signed by Monica Martinez MD on 05/16/2020 at 1:45:52 PM.    Final      ASSESSMENT/PLAN:  This is a very pleasant 60 year old Caucasian male recently diagnosed with stage IIIb/IV (T4, N2, M0/M1 C)non-small cell lung cancer, squamous cell carcinoma. 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 Diagnosed in February 2022.His PDL1 expression is 70%  The patient completed palliative radiotherapy to the large right upper lobe lung mass and mediastinal lymphadenopathy.  The patient started systemic chemotherapy with carboplatin for AUC of 5, paclitaxel 175 mg/M2 and Keytruda 200 mg IV every 3 weeks status post 1 cycle. He has a rough time with the first cycle of his  treatment with admission with CHF, C. Diff, and atrial fibrillation. He also has had neutropenia for the last several weeks.   He received zarxio for his neutropenia. His last dose was on 06/07/20. His labs show continued and slightly worse neutropenia with a WBC of 2.7 and ANC of 0.5 today.   The patient was seen with Dr. [name]. Labs were reviewed. Dr. [name] recommends that we discontinue chemotherapy for now due to his intolerance with fatigue/weakness and prolonged neutropenia. He is a good candidate for single agent immunotherapy with Keytruda due to his PDL1 expression of  70% which will not cause significant myelosuppression. He will receive cycle #2 tomorrow with single agent Keytruda. Reviewed he does not need to take neulasta with this. Of course, if the patient's blood work in the future improves, we can always revisit adding back chemotherapy or reduced dose chemotherapy.   We will continue to monitor his labs closely weekly for now.  We will see him back for a follow up visit in 3 weeks for evaluation before starting cycle #3.   His wife inquired about his dose of his atenolol. I suggested if she has questions about his atenolol dose, then she can send a mychart message or call his cardiologist. He has an appointment with them on 06/27/20.   Regarding his diarrhea, he thus far just had a few episodes of diarrhea (3 total) that started yesterday. Denies blood in the stool or fevers. He states this is not similar to when he had C. Diff before; however, he was instructed that if this fails to improve or worsens, then to reach out to gastroenterology.   The patient was advised to call immediately if she has any concerning symptoms in the interval. The patient voices understanding of current disease status and treatment options and is in agreement with the current care plan. All questions were answered. The patient knows to call the clinic with any problems, questions or concerns. We can  certainly see the patient much sooner if necessary    No orders of the defined types were placed in this encounter.    Eilleen Davoli L Kerron Sedano, PA-C 06/09/20  ADDENDUM: Hematology/Oncology Attending: I had a face-to-face encounter with the patient today.  I reviewed his record and recommended his care plan.  This is a very pleasant 60 years old white male with metastatic non-small cell lung cancer, squamous cell carcinoma presented with large right upper lobe lung mass in addition to satellite nodule in the right upper lobe and few other subcentimeter nodules.  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 The patient status post short course of palliative radiotherapy to the large right upper lobe lung mass. He started systemic chemotherapy with carboplatin, paclitaxel and Keytruda for 1 cycle but this was complicated with hospital admission for atrial fibrillation and C. difficile.  He is recovering slowly from his recent hospitalization.  His total white blood count and absolute neutrophil count has been low since his hospitalization. The patient received treatment with Neulasta after the chemotherapy and also received Granix in the last few days but his absolute neutrophil count is a still low. I recommended for him to proceed with cycle #2 but only with single agent Keytruda until improvement of his absolute neutrophil count. We will see him back for follow-up visit in 3 weeks for evaluation before starting cycle #3. The patient was advised to call immediately if he has any other concerning symptoms in the interval.  The total time spent in the appointment was 35 minutes. Disclaimer: This note was dictated with voice recognition software. Similar sounding words can inadvertently be transcribed and may be missed upon review. Eilleen Kempf, MD 06/09/20

## 2020-06-09 ENCOUNTER — Inpatient Hospital Stay: Payer: 59

## 2020-06-09 ENCOUNTER — Inpatient Hospital Stay: Payer: 59 | Admitting: Physician Assistant

## 2020-06-09 ENCOUNTER — Other Ambulatory Visit: Payer: Self-pay

## 2020-06-09 ENCOUNTER — Other Ambulatory Visit (HOSPITAL_COMMUNITY)
Admission: RE | Admit: 2020-06-09 | Discharge: 2020-06-09 | Disposition: A | Discharge status: Home / Self Care | Payer: 59 | Source: Ambulatory Visit | Attending: Emergency Medicine | Admitting: Emergency Medicine

## 2020-06-09 ENCOUNTER — Ambulatory Visit: Payer: 59

## 2020-06-09 VITALS — BP 111/81 | HR 105 | Temp 97.3°F | Resp 18 | Wt 216.7 lb

## 2020-06-09 DIAGNOSIS — Z20822 Contact with and (suspected) exposure to covid-19: Secondary | ICD-10-CM | POA: Diagnosis not present

## 2020-06-09 DIAGNOSIS — C3491 Malignant neoplasm of unspecified part of right bronchus or lung: Secondary | ICD-10-CM | POA: Diagnosis not present

## 2020-06-09 DIAGNOSIS — Z5112 Encounter for antineoplastic immunotherapy: Secondary | ICD-10-CM | POA: Diagnosis not present

## 2020-06-09 DIAGNOSIS — Z01812 Encounter for preprocedural laboratory examination: Secondary | ICD-10-CM | POA: Insufficient documentation

## 2020-06-09 LAB — CMP (CANCER CENTER ONLY)
ALT: 19 U/L (ref 0–44)
AST: 27 U/L (ref 15–41)
Albumin: 3.3 g/dL — ABNORMAL LOW (ref 3.5–5.0)
Alkaline Phosphatase: 128 U/L — ABNORMAL HIGH (ref 38–126)
Anion gap: 11 (ref 5–15)
BUN: 15 mg/dL (ref 6–20)
CO2: 23 mmol/L (ref 22–32)
Calcium: 8.9 mg/dL (ref 8.9–10.3)
Chloride: 98 mmol/L (ref 98–111)
Creatinine: 1.01 mg/dL (ref 0.61–1.24)
GFR, Estimated: >60 mL/min (ref >60)
Glucose, Bld: 122 mg/dL — ABNORMAL HIGH (ref 70–99)
Potassium: 3.7 mmol/L (ref 3.5–5.1)
Sodium: 132 mmol/L — ABNORMAL LOW (ref 135–145)
Total Bilirubin: 1.4 mg/dL — ABNORMAL HIGH (ref 0.3–1.2)
Total Protein: 7.2 g/dL (ref 6.5–8.1)

## 2020-06-09 LAB — CBC WITH DIFFERENTIAL (CANCER CENTER ONLY)
Abs Immature Granulocytes: 0.02 10*3/uL (ref 0.00–0.07)
Basophils Absolute: 0.1 10*3/uL (ref 0.0–0.1)
Basophils Relative: 2 %
Eosinophils Absolute: 0.2 10*3/uL (ref 0.0–0.5)
Eosinophils Relative: 6 %
HCT: 28.9 % — ABNORMAL LOW (ref 39.0–52.0)
Hemoglobin: 9.6 g/dL — ABNORMAL LOW (ref 13.0–17.0)
Immature Granulocytes: 1 %
Lymphocytes Relative: 34 %
Lymphs Abs: 0.9 10*3/uL (ref 0.7–4.0)
MCH: 29 pg (ref 26.0–34.0)
MCHC: 33.2 g/dL (ref 30.0–36.0)
MCV: 87.3 fL (ref 80.0–100.0)
Monocytes Absolute: 1 10*3/uL (ref 0.1–1.0)
Monocytes Relative: 37 %
Neutro Abs: 0.5 10*3/uL — ABNORMAL LOW (ref 1.7–7.7)
Neutrophils Relative %: 20 %
Platelet Count: 158 10*3/uL (ref 150–400)
RBC: 3.31 MIL/uL — ABNORMAL LOW (ref 4.22–5.81)
RDW: 20.4 % — ABNORMAL HIGH (ref 11.5–15.5)
WBC Count: 2.7 10*3/uL — ABNORMAL LOW (ref 4.0–10.5)
nRBC: 0 % (ref 0.0–0.2)

## 2020-06-10 ENCOUNTER — Inpatient Hospital Stay: Payer: 59

## 2020-06-10 ENCOUNTER — Telehealth: Payer: Self-pay | Admitting: Internal Medicine

## 2020-06-10 ENCOUNTER — Ambulatory Visit: Payer: 59

## 2020-06-10 VITALS — BP 109/75 | HR 101 | Temp 98.2°F

## 2020-06-10 DIAGNOSIS — Z5112 Encounter for antineoplastic immunotherapy: Secondary | ICD-10-CM | POA: Diagnosis not present

## 2020-06-10 DIAGNOSIS — C3491 Malignant neoplasm of unspecified part of right bronchus or lung: Secondary | ICD-10-CM

## 2020-06-10 LAB — SARS CORONAVIRUS 2 (TAT 6-24 HRS): SARS Coronavirus 2: NEGATIVE

## 2020-06-10 MED ORDER — SODIUM CHLORIDE 0.9 % IV SOLN
200.0000 mg | Freq: Once | INTRAVENOUS | Status: AC
  Administered 2020-06-10: 200 mg via INTRAVENOUS
  Filled 2020-06-10: qty 8

## 2020-06-10 MED ORDER — SODIUM CHLORIDE 0.9 % IV SOLN
Freq: Once | INTRAVENOUS | Status: AC
  Filled 2020-06-10: qty 250

## 2020-06-10 NOTE — Patient Instructions (Signed)
Pembrolizumab injection What is this medicine? PEMBROLIZUMAB (pem broe liz ue mab) is a monoclonal antibody. It is used to treat certain types of cancer. This medicine may be used for other purposes; ask your health care provider or pharmacist if you have questions. COMMON BRAND NAME(S): Keytruda What should I tell my health care provider before I take this medicine? They need to know if you have any of these conditions:  autoimmune diseases like Crohn's disease, ulcerative colitis, or lupus  have had or planning to have an allogeneic stem cell transplant (uses someone else's stem cells)  history of organ transplant  history of chest radiation  nervous system problems like myasthenia gravis or Guillain-Barre syndrome  an unusual or allergic reaction to pembrolizumab, other medicines, foods, dyes, or preservatives  pregnant or trying to get pregnant  breast-feeding How should I use this medicine? This medicine is for infusion into a vein. It is given by a health care professional in a hospital or clinic setting. A special MedGuide will be given to you before each treatment. Be sure to read this information carefully each time. Talk to your pediatrician regarding the use of this medicine in children. While this drug may be prescribed for children as young as 6 months for selected conditions, precautions do apply. Overdosage: If you think you have taken too much of this medicine contact a poison control center or emergency room at once. NOTE: This medicine is only for you. Do not share this medicine with others. What if I miss a dose? It is important not to miss your dose. Call your doctor or health care professional if you are unable to keep an appointment. What may interact with this medicine? Interactions have not been studied. This list may not describe all possible interactions. Give your health care provider a list of all the medicines, herbs, non-prescription drugs, or dietary  supplements you use. Also tell them if you smoke, drink alcohol, or use illegal drugs. Some items may interact with your medicine. What should I watch for while using this medicine? Your condition will be monitored carefully while you are receiving this medicine. You may need blood work done while you are taking this medicine. Do not become pregnant while taking this medicine or for 4 months after stopping it. Women should inform their doctor if they wish to become pregnant or think they might be pregnant. There is a potential for serious side effects to an unborn child. Talk to your health care professional or pharmacist for more information. Do not breast-feed an infant while taking this medicine or for 4 months after the last dose. What side effects may I notice from receiving this medicine? Side effects that you should report to your doctor or health care professional as soon as possible:  allergic reactions like skin rash, itching or hives, swelling of the face, lips, or tongue  bloody or black, tarry  breathing problems  changes in vision  chest pain  chills  confusion  constipation  cough  diarrhea  dizziness or feeling faint or lightheaded  fast or irregular heartbeat  fever  flushing  joint pain  low blood counts - this medicine may decrease the number of white blood cells, red blood cells and platelets. You may be at increased risk for infections and bleeding.  muscle pain  muscle weakness  pain, tingling, numbness in the hands or feet  persistent headache  redness, blistering, peeling or loosening of the skin, including inside the mouth  signs and  symptoms of high blood sugar such as dizziness; dry mouth; dry skin; fruity breath; nausea; stomach pain; increased hunger or thirst; increased urination  signs and symptoms of kidney injury like trouble passing urine or change in the amount of urine  signs and symptoms of liver injury like dark urine,  light-colored stools, loss of appetite, nausea, right upper belly pain, yellowing of the eyes or skin  sweating  swollen lymph nodes  weight loss Side effects that usually do not require medical attention (report to your doctor or health care professional if they continue or are bothersome):  decreased appetite  hair loss  tiredness This list may not describe all possible side effects. Call your doctor for medical advice about side effects. You may report side effects to FDA at 1-800-FDA-1088. Where should I keep my medicine? This drug is given in a hospital or clinic and will not be stored at home. NOTE: This sheet is a summary. It may not cover all possible information. If you have questions about this medicine, talk to your doctor, pharmacist, or health care provider.  2021 Elsevier/Gold Standard (2019-01-03 21:44:53)

## 2020-06-10 NOTE — Telephone Encounter (Signed)
R/s 5/10 per provider Pal. Called and spoke with patient. Confirmed appt

## 2020-06-11 ENCOUNTER — Ambulatory Visit (INDEPENDENT_AMBULATORY_CARE_PROVIDER_SITE_OTHER): Payer: 59 | Admitting: Emergency Medicine

## 2020-06-11 ENCOUNTER — Other Ambulatory Visit: Payer: Self-pay

## 2020-06-11 ENCOUNTER — Encounter: Payer: Self-pay | Admitting: Emergency Medicine

## 2020-06-11 ENCOUNTER — Ambulatory Visit: Payer: 59 | Admitting: Emergency Medicine

## 2020-06-11 DIAGNOSIS — C3491 Malignant neoplasm of unspecified part of right bronchus or lung: Secondary | ICD-10-CM

## 2020-06-11 DIAGNOSIS — J449 Chronic obstructive pulmonary disease, unspecified: Secondary | ICD-10-CM

## 2020-06-11 DIAGNOSIS — R918 Other nonspecific abnormal finding of lung field: Secondary | ICD-10-CM | POA: Diagnosis not present

## 2020-06-11 LAB — PULMONARY FUNCTION TEST
DL/VA % pred: 79 %
DL/VA: 3.39 ml/min/mmHg/L
DLCO cor % pred: 76 %
DLCO cor: 22.14 ml/min/mmHg
DLCO unc % pred: 63 %
DLCO unc: 18.23 ml/min/mmHg
FEF 25-75 Post: 2.76 L/sec
FEF 25-75 Pre: 1.9 L/sec
FEF2575-%Change-Post: 45 %
FEF2575-%Pred-Post: 87 %
FEF2575-%Pred-Pre: 60 %
FEV1-%Change-Post: 15 %
FEV1-%Pred-Post: 92 %
FEV1-%Pred-Pre: 80 %
FEV1-Post: 3.51 L
FEV1-Pre: 3.05 L
FEV1FVC-%Change-Post: 10 %
FEV1FVC-%Pred-Pre: 85 %
FEV6-%Change-Post: 6 %
FEV6-%Pred-Post: 100 %
FEV6-%Pred-Pre: 95 %
FEV6-Post: 4.83 L
FEV6-Pre: 4.55 L
FEV6FVC-%Change-Post: 0 %
FEV6FVC-%Pred-Post: 103 %
FEV6FVC-%Pred-Pre: 102 %
FVC-%Change-Post: 3 %
FVC-%Pred-Post: 97 %
FVC-%Pred-Pre: 93 %
FVC-Post: 4.87 L
FVC-Pre: 4.68 L
Post FEV1/FVC ratio: 72 %
Post FEV6/FVC ratio: 99 %
Pre FEV1/FVC ratio: 65 %
Pre FEV6/FVC Ratio: 99 %
RV % pred: 127 %
RV: 2.87 L
TLC % pred: 104 %
TLC: 7.53 L

## 2020-06-11 MED ORDER — LEVALBUTEROL TARTRATE 45 MCG/ACT IN AERO: 2.0000 | INHALATION_SPRAY | RESPIRATORY_TRACT | 12 refills | Status: DC | PRN

## 2020-06-11 NOTE — Patient Instructions (Signed)
We will try starting Xopenex to see if it helps your breathing.  You can use 2 puffs up to every 4 hours if needed for shortness of breath.  Keep track of whether it is helpful.  If so we may decide to start an every day scheduled inhaled medicine in the future. Follow-up with oncology as planned Follow-up with cardiology as planned Follow with Dr. [name] in 2 months or sooner if you have any problems.

## 2020-06-11 NOTE — Assessment & Plan Note (Signed)
Undergoing chemoradiation, following with Dr. [name].  Repeat imaging as per their plans

## 2020-06-11 NOTE — Progress Notes (Signed)
Subjective:    Patient ID: John [name], male    DOB: 04/23/1960, 60 y.o.   MRN: [id number]  HPI 60 yo former smoker (30 pack years) with a history of rheumatoid arthritis followed [facility] rheumatology and on Simponi, prednisone 5 mg daily, Arava. Hx of seizures in the past, formerly on dilantin, off this since his 95's.  He had COVID end of 2021, resulted in SOB. He is here to evaluate an abnormal CT chest.  He developed cough and hemoptysis beginning about 2.5 weeks ago.  He has slowly progressive dyspnea over a few years. He exerts, works on hoarse farm, having more trouble doing chores.   CXR was performed and then CT chest done on 03/11/2020.  I have the report available but not the films.  Note was made of a 7.2 cm mass contiguous with the right hilum and along the right bronchial tree lobulated and irregular with some associated cavitation.  There are numerous satellite nodules present.  There is occlusion of right upper lobe segmental bronchi consistent with endobronchial involvement.  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  His other past medical history significant for rheumatoid arthritis, seizures, COVID-19 in late 2021.  Pathology results showed squamous cell lung cancer.  He received palliative radiation, systemic chemotherapy.  Unfortunately since I last saw him and his cancer therapy was initiated he was hospitalized for septic shock due to E. coli bacteremia and C. difficile colitis.  Course was complicated by new onset atrial fibrillation. He remains weak. He reports severe exertional limitation, is still having labile HR, having trouble with rate control  He underwent pulmonary function testing today which I have reviewed, shows mild obstruction with a vigorous bronchodilator  response, normal lung volumes, decreased diffusion capacity.  He is not on any bronchodilator therapy   Review of Systems As per HPI      Objective:   Physical Exam Vitals:   06/11/20 1208  BP: 108/68  Pulse: (!) 105  Temp: (!) 97.5 F (36.4 C)  TempSrc: Temporal  SpO2: 97%  Weight: 215 lb 6.4 oz (97.7 kg)  Height: 5\' 11"  (1.803 m)   Gen: Pleasant, well-nourished, in no distress,  normal affect  ENT: No lesions,  mouth clear,  oropharynx clear, no postnasal drip  Neck: No JVD, no stridor  Lungs: No use of accessory muscles, no crackles or wheezing on normal respiration, no wheeze on forced expiration  Cardiovascular: RRR, heart sounds normal, no murmur or gallops, no peripheral edema  Musculoskeletal: No deformities, no cyanosis or clubbing  Neuro: alert, awake, non focal  Skin: Warm, no lesions or rash      Assessment & Plan:  Stage 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 (HCC) Undergoing chemoradiation, following with Dr. [name].  Repeat imaging as per their plans  COPD with asthma ([place]) Mild obstruction with a bronchodilator response on his PFT.  He has severe exertional dyspnea.  I think we should try using Xopenex as needed for symptom relief.  I am hesitant to start him on a long-acting bronchodilator for now given his persistent difficulty with A. fib and rate control.  We can consider starting scheduled BD depending on how he responds with the Xopenex and cardiology input.  He is planning to see Dr. Radford Pax in May.  Baltazar Apo, MD, PhD 06/11/2020,  3:57 PM [facility] [phone] or if no answer before 7:00PM call [phone] For any issues after 7:00PM please call eLink [phone]

## 2020-06-11 NOTE — Assessment & Plan Note (Signed)
Mild obstruction with a bronchodilator response on his PFT.  He has severe exertional dyspnea.  I think we should try using Xopenex as needed for symptom relief.  I am hesitant to start him on a long-acting bronchodilator for now given his persistent difficulty with A. fib and rate control.  We can consider starting scheduled BD depending on how he responds with the Xopenex and cardiology input.  He is planning to see Dr. Radford Pax in May.

## 2020-06-11 NOTE — Progress Notes (Signed)
PFT done today. 

## 2020-06-16 ENCOUNTER — Inpatient Hospital Stay: Payer: 59 | Attending: Physician Assistant

## 2020-06-16 ENCOUNTER — Ambulatory Visit: Payer: 59 | Admitting: Internal Medicine

## 2020-06-16 ENCOUNTER — Ambulatory Visit: Payer: 59

## 2020-06-16 ENCOUNTER — Other Ambulatory Visit: Payer: Self-pay

## 2020-06-16 ENCOUNTER — Other Ambulatory Visit: Payer: 59

## 2020-06-16 DIAGNOSIS — M069 Rheumatoid arthritis, unspecified: Secondary | ICD-10-CM | POA: Diagnosis not present

## 2020-06-16 DIAGNOSIS — C3411 Malignant neoplasm of upper lobe, right bronchus or lung: Secondary | ICD-10-CM | POA: Insufficient documentation

## 2020-06-16 DIAGNOSIS — C3491 Malignant neoplasm of unspecified part of right bronchus or lung: Secondary | ICD-10-CM

## 2020-06-16 DIAGNOSIS — Z5112 Encounter for antineoplastic immunotherapy: Secondary | ICD-10-CM | POA: Insufficient documentation

## 2020-06-16 DIAGNOSIS — Z79899 Other long term (current) drug therapy: Secondary | ICD-10-CM | POA: Diagnosis not present

## 2020-06-16 DIAGNOSIS — I480 Paroxysmal atrial fibrillation: Secondary | ICD-10-CM | POA: Diagnosis not present

## 2020-06-16 DIAGNOSIS — D701 Agranulocytosis secondary to cancer chemotherapy: Secondary | ICD-10-CM | POA: Diagnosis not present

## 2020-06-16 DIAGNOSIS — T451X5A Adverse effect of antineoplastic and immunosuppressive drugs, initial encounter: Secondary | ICD-10-CM | POA: Insufficient documentation

## 2020-06-16 DIAGNOSIS — C7971 Secondary malignant neoplasm of right adrenal gland: Secondary | ICD-10-CM | POA: Diagnosis present

## 2020-06-16 LAB — CMP (CANCER CENTER ONLY)
ALT: 24 U/L (ref 0–44)
AST: 32 U/L (ref 15–41)
Albumin: 2.9 g/dL — ABNORMAL LOW (ref 3.5–5.0)
Alkaline Phosphatase: 156 U/L — ABNORMAL HIGH (ref 38–126)
Anion gap: 10 (ref 5–15)
BUN: 7 mg/dL (ref 6–20)
CO2: 25 mmol/L (ref 22–32)
Calcium: 8.9 mg/dL (ref 8.9–10.3)
Chloride: 99 mmol/L (ref 98–111)
Creatinine: 0.94 mg/dL (ref 0.61–1.24)
GFR, Estimated: >60 mL/min (ref >60)
Glucose, Bld: 110 mg/dL — ABNORMAL HIGH (ref 70–99)
Potassium: 4.2 mmol/L (ref 3.5–5.1)
Sodium: 134 mmol/L — ABNORMAL LOW (ref 135–145)
Total Bilirubin: 1.2 mg/dL (ref 0.3–1.2)
Total Protein: 7.1 g/dL (ref 6.5–8.1)

## 2020-06-16 LAB — CBC WITH DIFFERENTIAL (CANCER CENTER ONLY)
Abs Immature Granulocytes: 0.02 10*3/uL (ref 0.00–0.07)
Basophils Absolute: 0 10*3/uL (ref 0.0–0.1)
Basophils Relative: 2 %
Eosinophils Absolute: 0.2 10*3/uL (ref 0.0–0.5)
Eosinophils Relative: 8 %
HCT: 28.3 % — ABNORMAL LOW (ref 39.0–52.0)
Hemoglobin: 9 g/dL — ABNORMAL LOW (ref 13.0–17.0)
Immature Granulocytes: 1 %
Lymphocytes Relative: 31 %
Lymphs Abs: 0.8 10*3/uL (ref 0.7–4.0)
MCH: 28.2 pg (ref 26.0–34.0)
MCHC: 31.8 g/dL (ref 30.0–36.0)
MCV: 88.7 fL (ref 80.0–100.0)
Monocytes Absolute: 0.7 10*3/uL (ref 0.1–1.0)
Monocytes Relative: 24 %
Neutro Abs: 0.9 10*3/uL — ABNORMAL LOW (ref 1.7–7.7)
Neutrophils Relative %: 34 %
Platelet Count: 230 10*3/uL (ref 150–400)
RBC: 3.19 MIL/uL — ABNORMAL LOW (ref 4.22–5.81)
RDW: 18.4 % — ABNORMAL HIGH (ref 11.5–15.5)
WBC Count: 2.7 10*3/uL — ABNORMAL LOW (ref 4.0–10.5)
nRBC: 0 % (ref 0.0–0.2)

## 2020-06-16 LAB — TSH: TSH: 2.721 u[IU]/mL (ref 0.350–4.500)

## 2020-06-17 ENCOUNTER — Ambulatory Visit
Admission: RE | Admit: 2020-06-17 | Discharge: 2020-06-17 | Disposition: A | Discharge status: Home / Self Care | Payer: Self-pay | Source: Ambulatory Visit | Attending: Internal Medicine | Admitting: Internal Medicine

## 2020-06-17 ENCOUNTER — Encounter: Payer: Self-pay | Admitting: *Deleted

## 2020-06-17 ENCOUNTER — Inpatient Hospital Stay
Admission: RE | Admit: 2020-06-17 | Discharge: 2020-06-17 | Disposition: A | Discharge status: Home / Self Care | Payer: Self-pay | Source: Ambulatory Visit | Attending: Internal Medicine | Admitting: Internal Medicine

## 2020-06-17 ENCOUNTER — Other Ambulatory Visit (HOSPITAL_COMMUNITY): Payer: Self-pay | Admitting: Internal Medicine

## 2020-06-17 DIAGNOSIS — C801 Malignant (primary) neoplasm, unspecified: Secondary | ICD-10-CM

## 2020-06-17 NOTE — Progress Notes (Signed)
Per Dr. [name], I took Mr. Achenbach's CD to radiology to upload into PACS.

## 2020-06-18 ENCOUNTER — Ambulatory Visit: Payer: 59

## 2020-06-23 ENCOUNTER — Inpatient Hospital Stay: Payer: 59

## 2020-06-23 ENCOUNTER — Other Ambulatory Visit: Payer: Self-pay

## 2020-06-23 ENCOUNTER — Ambulatory Visit: Payer: 59 | Admitting: Internal Medicine

## 2020-06-23 ENCOUNTER — Other Ambulatory Visit: Payer: 59

## 2020-06-23 DIAGNOSIS — Z5112 Encounter for antineoplastic immunotherapy: Secondary | ICD-10-CM | POA: Diagnosis not present

## 2020-06-23 DIAGNOSIS — C3491 Malignant neoplasm of unspecified part of right bronchus or lung: Secondary | ICD-10-CM

## 2020-06-23 LAB — CMP (CANCER CENTER ONLY)
ALT: 33 U/L (ref 0–44)
AST: 46 U/L — ABNORMAL HIGH (ref 15–41)
Albumin: 2.5 g/dL — ABNORMAL LOW (ref 3.5–5.0)
Alkaline Phosphatase: 175 U/L — ABNORMAL HIGH (ref 38–126)
Anion gap: 10 (ref 5–15)
BUN: 9 mg/dL (ref 6–20)
CO2: 24 mmol/L (ref 22–32)
Calcium: 8.5 mg/dL — ABNORMAL LOW (ref 8.9–10.3)
Chloride: 100 mmol/L (ref 98–111)
Creatinine: 0.92 mg/dL (ref 0.61–1.24)
GFR, Estimated: >60 mL/min (ref >60)
Glucose, Bld: 112 mg/dL — ABNORMAL HIGH (ref 70–99)
Potassium: 3.7 mmol/L (ref 3.5–5.1)
Sodium: 134 mmol/L — ABNORMAL LOW (ref 135–145)
Total Bilirubin: 1.1 mg/dL (ref 0.3–1.2)
Total Protein: 6.7 g/dL (ref 6.5–8.1)

## 2020-06-23 LAB — CBC WITH DIFFERENTIAL (CANCER CENTER ONLY)
Abs Immature Granulocytes: 0.02 10*3/uL (ref 0.00–0.07)
Basophils Absolute: 0 10*3/uL (ref 0.0–0.1)
Basophils Relative: 2 %
Eosinophils Absolute: 0.1 10*3/uL (ref 0.0–0.5)
Eosinophils Relative: 5 %
HCT: 28.2 % — ABNORMAL LOW (ref 39.0–52.0)
Hemoglobin: 9 g/dL — ABNORMAL LOW (ref 13.0–17.0)
Immature Granulocytes: 1 %
Lymphocytes Relative: 30 %
Lymphs Abs: 0.8 10*3/uL (ref 0.7–4.0)
MCH: 27.8 pg (ref 26.0–34.0)
MCHC: 31.9 g/dL (ref 30.0–36.0)
MCV: 87 fL (ref 80.0–100.0)
Monocytes Absolute: 0.8 10*3/uL (ref 0.1–1.0)
Monocytes Relative: 29 %
Neutro Abs: 0.9 10*3/uL — ABNORMAL LOW (ref 1.7–7.7)
Neutrophils Relative %: 33 %
Platelet Count: 246 10*3/uL (ref 150–400)
RBC: 3.24 MIL/uL — ABNORMAL LOW (ref 4.22–5.81)
RDW: 17.1 % — ABNORMAL HIGH (ref 11.5–15.5)
WBC Count: 2.6 10*3/uL — ABNORMAL LOW (ref 4.0–10.5)
nRBC: 0 % (ref 0.0–0.2)

## 2020-06-24 ENCOUNTER — Ambulatory Visit: Payer: 59

## 2020-06-24 ENCOUNTER — Ambulatory Visit: Payer: 59 | Admitting: Internal Medicine

## 2020-06-27 ENCOUNTER — Ambulatory Visit: Payer: 59 | Admitting: Cardiology

## 2020-06-27 ENCOUNTER — Encounter: Payer: Self-pay | Admitting: Cardiology

## 2020-06-27 ENCOUNTER — Other Ambulatory Visit: Payer: Self-pay

## 2020-06-27 VITALS — BP 104/76 | HR 92 | Ht 71.0 in | Wt 210.0 lb

## 2020-06-27 DIAGNOSIS — I480 Paroxysmal atrial fibrillation: Secondary | ICD-10-CM | POA: Diagnosis not present

## 2020-06-27 MED ORDER — AMIODARONE HCL 200 MG PO TABS: 200.0000 mg | ORAL_TABLET | Freq: Every day | ORAL | 3 refills | Status: DC

## 2020-06-27 MED ORDER — METOPROLOL TARTRATE 25 MG PO TABS: 25.0000 mg | ORAL_TABLET | Freq: Two times a day (BID) | ORAL | 3 refills | Status: DC

## 2020-06-27 NOTE — Addendum Note (Signed)
Addended by: [name] on: 06/27/2020 11:52 AM   Modules accepted: Orders

## 2020-06-27 NOTE — Progress Notes (Addendum)
Cardiology Office Note:    Date:  06/27/2020   ID:  John [name], DOB [birth year], MRN [id number]  PCP:  [name], MD  Cardiologist:  Fransico Him, MD    Referring MD: [name], MD   Chief Complaint  Patient presents with  . Atrial Fibrillation    History of Present Illness:    John [name] is a 60 y.o. male with a hx of squamous cell carcinoma of lung, rheumatoid arthritis, GERD. In April he presented to the emergency department with complaints of generalized weakness. He had been started on systemic chemotherapy recently PTA. He was noted to be tachycardic in rapid atrial fibrillation. He was hospitalized for further management of acute C diff diarrhea, sepsis and febrile neutropenia.  Blood cx were + for E coli and started on antibx. He was started on IV Cardizem gtt for his afib.  TS was normal.  He was started on  started on IV Amio and ultimately converted to NSR.  He was transitioned to oral Amio and Lopressor was added. No anticoagulation was started given his CHADS2VASC score of 0 as well as concern for possible tumor pressing on PA on CT imaging of chest.    He is here today for followup of PAF.  He tells me he has intermittent pressure on the right side of his chest he thinks is related to his cancer.  It is very random and is nonexertional.  He did not have any coronary artery calcifications on chest CTA. He also has chronic DOE due to his lung CA and his pulmonologist placed him on Xopenex which helps. He denies any anginal chest pain or pressure, PND, orthopnea, LE edema, dizziness (except if he stands up too fast), palpitations or syncope. His main complaint is significant fatigue related to his underlying lung cancer.  He is compliant with his meds and is tolerating meds with no SE.    Past Medical History:  Diagnosis Date  . Dyspnea   . Family history of adverse reaction to anesthesia    brother with seizures had episode under anesthesia.  Patient has  seizures as well.  [name] GERD (gastroesophageal reflux disease)   . PAF (paroxysmal atrial fibrillation) (HCC)    CHADS2VSAC score 0  . Rheumatoid aortitis   . Squamous cell lung cancer [facility])     Past Surgical History:  Procedure Laterality Date  . BRONCHIAL BRUSHINGS  03/27/2020   Procedure: BRONCHIAL BRUSHINGS;  Surgeon: [name], MD;  Location: [facility] ENDOSCOPY;  Service: Cardiopulmonary;;  . CATARACT EXTRACTION  2016   at [facility]  . FINE NEEDLE ASPIRATION  03/27/2020   Procedure: FINE NEEDLE ASPIRATION;  Surgeon: [name], MD;  Location: [facility] ENDOSCOPY;  Service: Cardiopulmonary;;  . HIP SURGERY Left   . VIDEO BRONCHOSCOPY WITH ENDOBRONCHIAL ULTRASOUND N/A 03/27/2020   Procedure: VIDEO BRONCHOSCOPY WITH ENDOBRONCHIAL ULTRASOUND;  Surgeon: [name], MD;  Location: [place];  Service: Cardiopulmonary;  Laterality: N/A;    Current Medications: Current Meds  Medication Sig  . acetaminophen (TYLENOL) 500 MG tablet Take 500 mg by mouth every 6 (six) hours as needed for mild pain or moderate pain.  [name] amiodarone (PACERONE) 200 MG tablet Take 2 tablets twice daily for 7 days then 1 tablet once daily  . CARBOplatin 1000 MG/100ML SOLN Inject into the vein.  Karle Barr (ZARXIO) 480 MCG/0.8ML SOSY injection Injection 0.8 ml daily for 3 days  . leflunomide (ARAVA) 20 MG tablet Take 1 tablet (20 mg total) by mouth  at bedtime. MAY RESUME AFTER COMPLETING TREATMENT FOR C-DIFF  . levalbuterol (XOPENEX HFA) 45 MCG/ACT inhaler Inhale 2 puffs into the lungs every 4 (four) hours as needed for wheezing.  . metoprolol tartrate (LOPRESSOR) 25 MG tablet Take 1 tablet (25 mg total) by mouth 2 (two) times daily.  [name] PACLitaxel 100 MG/16.67ML CONC Inject into the vein.  [name] PARoxetine (PAXIL) 10 MG tablet Take 10 mg by mouth daily.  . pegfilgrastim-bmez (ZIEXTENZO) 6 MG/0.6ML injection Inject 0.6 mL into the skin 2 days after chemotherapy  . PRILOSEC OTC 20 MG tablet Take 20 mg by mouth at  bedtime.  . prochlorperazine (COMPAZINE) 10 MG tablet Take 1 tablet (10 mg total) by mouth every 6 (six) hours as needed for nausea or vomiting.  [name] SIMPONI 50 MG/0.5ML SOAJ Inject 50 mg into the skin every 30 (thirty) days. MAY RESUME AFTER COMPLETING TREATMENT FOR C-DIFF  . sodium chloride 0.9 % SOLN 50 mL with pembrolizumab 100 MG/4ML SOLN 2 mg/kg Inject 2 mg/kg into the vein.     Allergies:   Patient has no known allergies.   Social History   Socioeconomic History  . Marital status: Married    Spouse name: Not on file  . Number of children: Not on file  . Years of education: Not on file  . Highest education level: Not on file  Occupational History  . Not on file  Tobacco Use  . Smoking status: Former Smoker    Packs/day: 1.00    Years: 30.00    Pack years: 30.00    Types: Cigarettes    Quit date: 2018    Years since quitting: 4.3  . Smokeless tobacco: Never Used  Vaping Use  . Vaping Use: Never used  Substance and Sexual Activity  . Alcohol use: Not Currently  . Drug use: Never  . Sexual activity: Not on file  Other Topics Concern  . Not on file  Social History Narrative  . Not on file   Social Determinants of Health   Financial Resource Strain: Not on file  Food Insecurity: Not on file  Transportation Needs: Not on file  Physical Activity: Not on file  Stress: Not on file  Social Connections: Not on file     Family History: The patient's family history includes Arrhythmia in his mother; Heart disease in his father.  ROS:   Please see the history of present illness.    ROS  All other systems reviewed and negative.   EKGs/Labs/Other Studies Reviewed:    The following studies were reviewed today: none  EKG:  EKG is ordered today.  The ekg ordered today demonstrates NSR with low voltage QRS and inferior infarct age undetermined Recent Labs: 05/19/2020: Magnesium 1.8 06/16/2020: TSH 2.721 06/23/2020: ALT 33; BUN 9; Creatinine 0.92; Hemoglobin 9.0; Platelet  Count 246; Potassium 3.7; Sodium 134   Recent Lipid Panel No results found for: CHOL, TRIG, HDL, CHOLHDL, VLDL, LDLCALC, LDLDIRECT  CHA2DS2-VASc Score = 0 [CHF History: No, HTN History: No, Diabetes History: No, Stroke History: No, Vascular Disease History: No, Age Score: 0, Gender Score: 0].  Therefore, the patient's annual risk of stroke is 0.2 %.        Physical Exam:    VS:  BP 104/76   Pulse 92   Ht 5\' 11"  (1.803 m)   Wt 210 lb (95.3 kg)   BMI 29.29 kg/m     Wt Readings from Last 3 Encounters:  06/27/20 210 lb (95.3 kg)  06/11/20 215  lb 6.4 oz (97.7 kg)  06/09/20 216 lb 11.2 oz (98.3 kg)     GEN:  Well nourished, well developed in no acute distress HEENT: Normal NECK: No JVD; No carotid bruits LYMPHATICS: No lymphadenopathy CARDIAC: RRR, no murmurs, rubs, gallops RESPIRATORY:  Clear to auscultation without rales, wheezing or rhonchi  ABDOMEN: Soft, non-tender, non-distended MUSCULOSKELETAL:  No edema; No deformity  SKIN: Warm and dry NEUROLOGIC:  Alert and oriented x 3 PSYCHIATRIC:  Normal affect   ASSESSMENT:    1. PAF (paroxysmal atrial fibrillation) (HCC)    PLAN:    In order of problems listed above:  PAF -he atrial fibrillation with RVR in the setting of sepsis, severe C diff diarrhea and recent chemo for lung CA -he was not placed on anticoagulation due to CHADS2VASC score of 0 and possible lung mass compressing PA with concern of possible massive bleed if this eroded into his PA. -he has not had any palpitations and is maintaining NSR on exam today -continue medical management with prescription drug management on Lopressor 25mg  BID and decrease Amio to 200mg  daily -I will refill his Amio and lopressor today -check TSH and ALT in Aug -he sees his ophthalmologist yearly -PFTs are up to date  Followup with me in 6 months   Medication Adjustments/Labs and Tests Ordered: Current medicines are reviewed at length with the patient today.  Concerns  regarding medicines are outlined above.  No orders of the defined types were placed in this encounter.  No orders of the defined types were placed in this encounter.   Signed, Fransico Him, MD  06/27/2020 11:28 AM    [facility]

## 2020-06-27 NOTE — Progress Notes (Signed)
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 He wasdiagnosed in February 2022.He also has rheumatoid arthritis.  PDL1:70%  PRIOR THERAPY: Palliative radiotherapy to the large right upper lobe lung mass and mediastinal lymphadenopathy under the care of Dr. [name]  CURRENT THERAPY: Systemic chemotherapy with carboplatin for AUC of 5, paclitaxel 175 mg/M2 and Keytruda 200 mg IV every 3 weeks with Neulasta support. First dose May 05, 2020. Status post 2 cycles. Starting from cycle #2, the patient will be on single agent immunotherapy with Keytruda due to neutropenia with chemotherapy.   INTERVAL HISTORY: John [name] 60 y.o. male returns to the clinic today for a follow up visit accompanied by his wife. The patient was hospitalized after cycle #1 of treatment due to presented to the emergency room on 05/12/2020 with a chief complaint of weakness.  The patient was hospitalized and was found to have A. fib with RVR.  He also had febrile neutropenia and C. difficile for which he was treated. He completed his antibiotics. He had prolonged neutropenia after cycle #1 despite receiving Ziextenzo and Zarxio injections. His treatment was delayed by a few weeks. Therefore, to avoid further dose delays, the patient proceeded with cycle #2 with single agent immunotherapy with Keytruda only.   He tolerated cycle #2 well without any new adverse side effects.  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 Denies extremity weakness. He only received one cycle of taxol. He does not have diabetes. He follows with cardiology for his heart rate and had a follow-up with them on 06/27/20.  He denies any fever or chills. He has night sweats. He also has decreased appetite and weight loss. He is scheduled to see a member of the nutritionist team on 5/31. He was drinking boost/ensure but got sick of this so now he is using carnation breakfast essentials. He reports dry mouth and has been drinking a lot of water and using biotene. No evidence of thrush.  He also recently had an appointment with pulmonologist, Dr. [name] who performed PFTs and prescribed an inhaler.  Reports shortness of breath with exertion. He reports a cough when he lays down at night for which he uses halls. He denies post nasal drainage or allergies. He takes Prilosec of heartburn but denies heart burn symptoms. He denies any hemoptysis.  He denies any nausea, vomiting, or constipation. He has dark stools sometimes but is also taking an iron supplement. He does not have anymore diarrhea.  He denies any headache or visual changes.  He denies any rashes or skin changes.  The patient is here today for evaluation before starting cycle #3.  MEDICAL HISTORY: Past Medical History:  Diagnosis Date  . Dyspnea   . Family history of adverse reaction to anesthesia    brother with seizures had episode under anesthesia.  Patient has seizures as well.  [name] GERD (gastroesophageal reflux disease)   . PAF (paroxysmal atrial fibrillation) (HCC)    CHADS2VSAC score 0  . Rheumatoid aortitis   . Squamous cell lung cancer ([facility])  ALLERGIES:  has No Known Allergies.  MEDICATIONS:  Current Outpatient Medications  Medication Sig Dispense Refill  . acetaminophen (TYLENOL) 500 MG tablet Take 500 mg by mouth every 6 (six) hours as needed for mild pain or moderate pain.    [name] amiodarone (PACERONE) 200 MG tablet Take 1 tablet (200 mg total) by mouth  daily. 90 tablet 3  . CARBOplatin 1000 MG/100ML SOLN Inject into the vein.    Karle Barr (ZARXIO) 480 MCG/0.8ML SOSY injection Injection 0.8 ml daily for 3 days 2.4 mL 0  . gabapentin (NEURONTIN) 100 MG capsule Take 1 capsule (100 mg total) by mouth at bedtime. 30 capsule 0  . leflunomide (ARAVA) 20 MG tablet Take 1 tablet (20 mg total) by mouth at bedtime. MAY RESUME AFTER COMPLETING TREATMENT FOR C-DIFF    . levalbuterol (XOPENEX HFA) 45 MCG/ACT inhaler Inhale 2 puffs into the lungs every 4 (four) hours as needed for wheezing. 1 each 12  . metoprolol tartrate (LOPRESSOR) 25 MG tablet Take 1 tablet (25 mg total) by mouth 2 (two) times daily. 180 tablet 3  . PACLitaxel 100 MG/16.67ML CONC Inject into the vein.    [name] PARoxetine (PAXIL) 10 MG tablet Take 10 mg by mouth daily.    . pegfilgrastim-bmez (ZIEXTENZO) 6 MG/0.6ML injection Inject 0.6 mL into the skin 2 days after chemotherapy 0.6 mL 3  . PRILOSEC OTC 20 MG tablet Take 20 mg by mouth at bedtime.    . prochlorperazine (COMPAZINE) 10 MG tablet Take 1 tablet (10 mg total) by mouth every 6 (six) hours as needed for nausea or vomiting. 30 tablet 0  . SIMPONI 50 MG/0.5ML SOAJ Inject 50 mg into the skin every 30 (thirty) days. MAY RESUME AFTER COMPLETING TREATMENT FOR C-DIFF 0.3 mL   . sodium chloride 0.9 % SOLN 50 mL with pembrolizumab 100 MG/4ML SOLN 2 mg/kg Inject 2 mg/kg into the vein.     No current facility-administered medications for this visit.    SURGICAL HISTORY:  Past Surgical History:  Procedure Laterality Date  . BRONCHIAL BRUSHINGS  03/27/2020   Procedure: BRONCHIAL BRUSHINGS;  Surgeon: [name], MD;  Location: [facility] ENDOSCOPY;  Service: Cardiopulmonary;;  . CATARACT EXTRACTION  2016   at [facility]  . FINE NEEDLE ASPIRATION  03/27/2020   Procedure: FINE NEEDLE ASPIRATION;  Surgeon: [name], MD;  Location: [facility] ENDOSCOPY;  Service: Cardiopulmonary;;  . HIP SURGERY Left   . VIDEO BRONCHOSCOPY WITH ENDOBRONCHIAL  ULTRASOUND N/A 03/27/2020   Procedure: VIDEO BRONCHOSCOPY WITH ENDOBRONCHIAL ULTRASOUND;  Surgeon: [name], MD;  Location: [facility];  Service: Cardiopulmonary;  Laterality: N/A;    REVIEW OF SYSTEMS:   Review of Systems  Constitutional:Positive for fatigue and generalized weakness, weight loss, and appetite change.Negative for chills and fever. HENT: Negative for mouth sores, nosebleeds, sore throat and trouble swallowing.  Eyes: Negative for eye problems and icterus.  Respiratory:Positive for baseline dyspnea on exertion and cough.Negative for  hemoptysisand wheezing.  Cardiovascular:Negative for chest pain and leg swelling.  Gastrointestinal:  Negative for abdominal pain, constipation, diarrhea, nausea and vomiting.  Genitourinary: Negative for bladder incontinence, difficulty urinating, dysuria, frequency and hematuria.  Musculoskeletal: Negative for back pain, gait problem, neck pain and neck stiffness.  Skin: Negative for itching and rash.  Neurological: Positive for upper and lower extremity peripheral neuropathy. Negative for dizziness, extremity weakness, gait problem, headaches, light-headedness and seizures.  Hematological: Negative for adenopathy. Does not bruise/bleed easily.  Psychiatric/Behavioral: Negative for confusion, depression and sleep disturbance.  The patient is not nervous/anxious.    PHYSICAL EXAMINATION:  Blood pressure 96/70, pulse (!) 108, temperature (!) 97.5 F (36.4 C), temperature source Tympanic, resp. rate 18, height '5\' 11"'  (1.803 m), weight 208 lb 14.4 oz (94.8 kg), SpO2 95 %.  ECOG PERFORMANCE STATUS: 1 - Symptomatic but completely ambulatory  Physical Exam  Constitutional: Oriented to person, place, and time and well-developed, well-nourished, and in no distress.  HENT:  Head: Normocephalic and atraumatic.  Mouth/Throat: Oropharynx is clear and moist. No oropharyngeal exudate.  Eyes: Conjunctivae are normal. Right eye exhibits no  discharge. Left eye exhibits no discharge. No scleral icterus.  Neck: Normal range of motion. Neck supple.  Cardiovascular: Normal rate, regular rhythm, normal heart sounds and intact distal pulses.   Pulmonary/Chest: Effort normal and breath sounds normal. No respiratory distress. No wheezes. No rales.  Abdominal: Soft. Bowel sounds are normal. Exhibits no distension and no mass. There is no tenderness.  Musculoskeletal: Normal range of motion. Exhibits no edema.  Lymphadenopathy:    No cervical adenopathy.  Neurological: Alert and oriented to person, place, and time. Exhibits normal muscle tone. Gait normal. Coordination normal.  Skin: Skin is warm and dry. No rash noted. Not diaphoretic. No erythema. No pallor.  Psychiatric: Mood, memory and judgment normal.  Vitals reviewed.  LABORATORY DATA: Lab Results  Component Value Date   WBC 2.8 (L) 06/30/2020   HGB 9.7 (L) 06/30/2020   HCT 30.7 (L) 06/30/2020   MCV 87.5 06/30/2020   PLT 289 06/30/2020      Chemistry      Component Value Date/Time   NA 135 06/30/2020 0756   K 3.7 06/30/2020 0756   CL 99 06/30/2020 0756   CO2 26 06/30/2020 0756   BUN 9 06/30/2020 0756   CREATININE 0.84 06/30/2020 0756      Component Value Date/Time   CALCIUM 8.8 (L) 06/30/2020 0756   ALKPHOS 185 (H) 06/30/2020 0756   AST 50 (H) 06/30/2020 0756   ALT 39 06/30/2020 0756   BILITOT 1.1 06/30/2020 0756       RADIOGRAPHIC STUDIES:  No results found.   ASSESSMENT/PLAN:  This is a very pleasant 60 year old white male recently diagnosed with stage IIIb/IV (T4, N2, M0/M1 C)non-small cell lung cancer, squamous cell carcinoma. 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 Diagnosed in February 2022.His PDL1 expression is 70%  The patient completed  palliative radiotherapy to the large right upper lobe lung mass and mediastinal lymphadenopathy.  The patient started systemic chemotherapy with carboplatin for AUC of 5, paclitaxel 175 mg/M2 and Keytruda 200 mg IV every 3 weeks status post 1 cycle. He has a rough time with the first cycle of his treatment with admission with CHF, C. Diff, and atrial fibrillation.  He also had prolonged neutropenia. Starting with cycle #2, Dr. [name] recommends that we discontinue chemotherapy for now due to his intolerance with fatigue/weakness and prolonged neutropenia. He is a good candidate for single agent immunotherapy with Keytruda due to his PDL1 expression of 70% which will not cause significant myelosuppression. He will receive cycle #3 tomorrow with single agent Keytruda. Reviewed he does not need to take neulasta with this. Of course, if the patient's blood work in the future improves, we can always revisit adding back chemotherapy or reduced dose chemotherapy.  Labs are reviewed.  Recommend that he proceed with cycle #3 tomorrow scheduled with single agent Keytruda. He continues to have neutropenia with an ANC of 1.0.  I will arrange for restaging CT scan the chest, abdomen, and pelvis prior to starting his next cycle of treatment.  We will see him back for follow-up visit in 3 weeks for evaluation before starting cycle #4 and to review his scan results.  The patient will continue to follow with cardiology for his atrial fibrillation.  I discussed his neuropathy with Dr. [name] who also addressed this previously with the patient. Dr. [name] believes it would be unlikely that his symptoms would have come from taxol given that he only received 1 cycle of this. The patient denies extremity weakness in his hands or feet. He reports generalized fatigue/weakness. I will arrange for the patient to have B12 and B6 testing on his next lab draw. He also has his TSH drawn on routine labs. If no clear etiology,  discussed with the patient and Dr. [name] we can refer him to neurology for further workup.   The patient reports cough at night with laying down. Denies post nasal drainage or GERD symptoms. He takes prilosec. Advised he can try to take robitussin. We also will reevaluate his lungs on upcoming imaging studies.   For his dry mouth, he will drink fluid and biotene. He has an appointment with nutrition for his weight loss on 5/31. He does not have an infusion appointment with them that day so I will message them to see if they need to reschedule their visit with him.   The patient was advised to call immediately if he has any concerning symptoms in the interval. The patient voices understanding of current disease status and treatment options and is in agreement with the current care plan. All questions were answered. The patient knows to call the clinic with any problems, questions or concerns. We can certainly see the patient much sooner if necessary        Orders Placed This Encounter  Procedures  . CT Chest W Contrast    Standing Status:   Future    Standing Expiration Date:   06/30/2021    Order Specific Question:   If indicated for the ordered procedure, I authorize the administration of contrast media per Radiology protocol    Answer:   Yes    Order Specific Question:   Preferred imaging location?    Answer:   [facility]  . CT Abdomen Pelvis W Contrast    Standing Status:   Future    Standing Expiration Date:   06/30/2021    Order Specific Question:   If indicated for the ordered procedure, I authorize the administration of contrast media per Radiology protocol    Answer:   Yes    Order Specific Question:   Preferred imaging location?    Answer:   [facility]    Order Specific Question:   Is Oral Contrast requested for this exam?    Answer:   Yes, Per Radiology protocol  . Vitamin B12    Standing Status:   Future    Standing Expiration Date:   06/30/2021  .  Vitamin B6    Standing Status:   Future    Standing Expiration Date:   06/30/2021     I spent 20-29 minutes in this encounter.   Corry Storie L Mcclellan Demarais, PA-C 06/30/20

## 2020-06-27 NOTE — Patient Instructions (Addendum)
Medication Instructions:  Your physician recommends that you continue on your current medications as directed. Please refer to the Current Medication list given to you today.  *If you need a refill on your cardiac medications before your next appointment, please call your pharmacy*   Lab Work: TSH and ALT in August If you have labs (blood work) drawn today and your tests are completely normal, you will receive your results only by: [name] MyChart Message (if you have MyChart) OR . A paper copy in the mail If you have any lab test that is abnormal or we need to change your treatment, we will call you to review the results.  Follow-Up: At [facility], you and your health needs are our priority.  As part of our continuing mission to provide you with exceptional heart care, we have created designated Provider Care Teams.  These Care Teams include your primary Cardiologist (physician) and Advanced Practice Providers (APPs -  Physician Assistants and Nurse Practitioners) who all work together to provide you with the care you need, when you need it.  Your next appointment:   6 months  The format for your next appointment:   In Person  Provider:   You may see Fransico Him, MD or one of the following Advanced Practice Providers on your designated Care Team:    Melina Copa, PA-C  Ermalinda Barrios, PA-C

## 2020-06-30 ENCOUNTER — Other Ambulatory Visit: Payer: 59

## 2020-06-30 ENCOUNTER — Inpatient Hospital Stay: Payer: 59 | Admitting: Physician Assistant

## 2020-06-30 ENCOUNTER — Inpatient Hospital Stay: Payer: 59

## 2020-06-30 ENCOUNTER — Other Ambulatory Visit: Payer: Self-pay | Admitting: Internal Medicine

## 2020-06-30 ENCOUNTER — Other Ambulatory Visit: Payer: Self-pay

## 2020-06-30 VITALS — BP 96/70 | HR 108 | Temp 97.5°F | Resp 18 | Ht 71.0 in | Wt 208.9 lb

## 2020-06-30 DIAGNOSIS — Z5112 Encounter for antineoplastic immunotherapy: Secondary | ICD-10-CM | POA: Diagnosis not present

## 2020-06-30 DIAGNOSIS — C3491 Malignant neoplasm of unspecified part of right bronchus or lung: Secondary | ICD-10-CM | POA: Diagnosis not present

## 2020-06-30 DIAGNOSIS — R918 Other nonspecific abnormal finding of lung field: Secondary | ICD-10-CM

## 2020-06-30 DIAGNOSIS — G629 Polyneuropathy, unspecified: Secondary | ICD-10-CM

## 2020-06-30 LAB — CBC WITH DIFFERENTIAL (CANCER CENTER ONLY)
Abs Immature Granulocytes: 0.01 10*3/uL (ref 0.00–0.07)
Basophils Absolute: 0 10*3/uL (ref 0.0–0.1)
Basophils Relative: 1 %
Eosinophils Absolute: 0.1 10*3/uL (ref 0.0–0.5)
Eosinophils Relative: 4 %
HCT: 30.7 % — ABNORMAL LOW (ref 39.0–52.0)
Hemoglobin: 9.7 g/dL — ABNORMAL LOW (ref 13.0–17.0)
Immature Granulocytes: 0 %
Lymphocytes Relative: 32 %
Lymphs Abs: 0.9 10*3/uL (ref 0.7–4.0)
MCH: 27.6 pg (ref 26.0–34.0)
MCHC: 31.6 g/dL (ref 30.0–36.0)
MCV: 87.5 fL (ref 80.0–100.0)
Monocytes Absolute: 0.7 10*3/uL (ref 0.1–1.0)
Monocytes Relative: 25 %
Neutro Abs: 1 10*3/uL — ABNORMAL LOW (ref 1.7–7.7)
Neutrophils Relative %: 38 %
Platelet Count: 289 10*3/uL (ref 150–400)
RBC: 3.51 MIL/uL — ABNORMAL LOW (ref 4.22–5.81)
RDW: 17 % — ABNORMAL HIGH (ref 11.5–15.5)
WBC Count: 2.8 10*3/uL — ABNORMAL LOW (ref 4.0–10.5)
nRBC: 0 % (ref 0.0–0.2)

## 2020-06-30 LAB — CMP (CANCER CENTER ONLY)
ALT: 39 U/L (ref 0–44)
AST: 50 U/L — ABNORMAL HIGH (ref 15–41)
Albumin: 2.6 g/dL — ABNORMAL LOW (ref 3.5–5.0)
Alkaline Phosphatase: 185 U/L — ABNORMAL HIGH (ref 38–126)
Anion gap: 10 (ref 5–15)
BUN: 9 mg/dL (ref 6–20)
CO2: 26 mmol/L (ref 22–32)
Calcium: 8.8 mg/dL — ABNORMAL LOW (ref 8.9–10.3)
Chloride: 99 mmol/L (ref 98–111)
Creatinine: 0.84 mg/dL (ref 0.61–1.24)
GFR, Estimated: >60 mL/min (ref >60)
Glucose, Bld: 111 mg/dL — ABNORMAL HIGH (ref 70–99)
Potassium: 3.7 mmol/L (ref 3.5–5.1)
Sodium: 135 mmol/L (ref 135–145)
Total Bilirubin: 1.1 mg/dL (ref 0.3–1.2)
Total Protein: 6.9 g/dL (ref 6.5–8.1)

## 2020-06-30 MED ORDER — GABAPENTIN 100 MG PO CAPS: 100.0000 mg | ORAL_CAPSULE | Freq: Every day | ORAL | 0 refills | Status: DC

## 2020-07-01 ENCOUNTER — Other Ambulatory Visit: Payer: Self-pay | Admitting: Internal Medicine

## 2020-07-01 ENCOUNTER — Encounter: Payer: Self-pay | Admitting: Radiation Oncology

## 2020-07-01 ENCOUNTER — Inpatient Hospital Stay: Payer: 59

## 2020-07-01 VITALS — BP 93/71 | HR 92 | Temp 97.5°F | Resp 97

## 2020-07-01 DIAGNOSIS — Z5112 Encounter for antineoplastic immunotherapy: Secondary | ICD-10-CM | POA: Diagnosis not present

## 2020-07-01 DIAGNOSIS — C3491 Malignant neoplasm of unspecified part of right bronchus or lung: Secondary | ICD-10-CM

## 2020-07-01 MED ORDER — SODIUM CHLORIDE 0.9 % IV SOLN
INTRAVENOUS | Status: DC
  Filled 2020-07-01: qty 250

## 2020-07-01 MED ORDER — SODIUM CHLORIDE 0.9 % IV SOLN
200.0000 mg | Freq: Once | INTRAVENOUS | Status: AC
  Administered 2020-07-01: 200 mg via INTRAVENOUS
  Filled 2020-07-01: qty 8

## 2020-07-01 NOTE — Progress Notes (Signed)
Location of tumor and Histology per Pathology Report: right lung 03/27/2020   Biopsy: 03/27/2020   Past/Anticipated interventions by surgeon, if any: none at this time  Past/Anticipated interventions by medical oncology, if any: Chemotherapy      Pain issues, if any:  no   SAFETY ISSUES:  Prior radiation? yes, right lung  Pacemaker/ICD? no  Possible current pregnancy? no  Is the patient on methotrexate? no  Current Complaints / other details:  none    Weight 204.2 lbs Height 5'11" Temp 97.5 Pulse 95 Respirations 18 BP 106/75 O2 97% on room air

## 2020-07-02 ENCOUNTER — Ambulatory Visit
Admission: RE | Admit: 2020-07-02 | Discharge: 2020-07-02 | Disposition: A | Discharge status: Home / Self Care | Payer: 59 | Source: Ambulatory Visit | Attending: Radiation Oncology | Admitting: Radiation Oncology

## 2020-07-02 ENCOUNTER — Ambulatory Visit: Payer: 59

## 2020-07-02 DIAGNOSIS — C3491 Malignant neoplasm of unspecified part of right bronchus or lung: Secondary | ICD-10-CM | POA: Diagnosis present

## 2020-07-02 HISTORY — DX: Personal history of irradiation: Z92.3

## 2020-07-03 ENCOUNTER — Telehealth: Payer: Self-pay | Admitting: Physician Assistant

## 2020-07-03 NOTE — Telephone Encounter (Signed)
Scheduled per los. Called and left msg. Mailed printout  °

## 2020-07-06 NOTE — Progress Notes (Signed)
Radiation Oncology         [phone] ________________________________   Outpatient Re-consultation  Name: John [name] MRN: [id number]  Date: 07/07/2020  DOB: 06/11/1960  GH:[id number], Jeneen Rinks, MD  Curt Bears, MD   REFERRING PHYSICIAN: Curt Bears, MD  DIAGNOSIS: The encounter diagnosis was Stage 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 ([place]).  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 male who is seen as a courtesy of Dr. [name] for an opinion concerning radiation therapy as part of management for his recently diagnosed lung cancer. Today, he is accompanied by  his wife. The patient began to experience worsening shortness of breath, cough, and hemoptysis in January of 2022. Thus, he underwent a chest CT scan on 03/11/2020 that showed a 7.2 cm mass contiguous with the right hilum and along the right bronchial tree, lobulated and irregular with some associated cavitation. There were also noted to be numerous satellite nodules and occlusion of the right upper lobe segmental bronchi, consistent with endobronchial involvement. Additionally, there was a large right adrenal mass that measured 5.7 x 4.3 cm and was suspicious for metastatic disease.   Subsequently, the patient saw Dr. [name], who performed a video bronchoscopy with endobronchial ultrasound on 03/27/2020. Pathology from the procedure revealed squamous cell carcinoma of the right upper lobe. Cytology revealed malignant cells consistent with squamous cell carcinoma oft he right upper lobe brushing and fine needle aspiration.   The patient was seen in consultation with Dr. [name] on 04/10/2020, at which time it was recommended that staging workup be completed with PET scan and MRI of the brain. Depending on the hypermetabolic activity in the right adrenal gland, they discussed CT-guided core biopsy of the lesion to rule out  metastatic disease. If the adrenal gland is negative, then it was recommended that the patient proceed with concurrent chemoradiation with weekly Carboplatin and Paclitaxel. If the right adrenal gland lesion is suspicious or confirmed with a biopsy, then the patient would be treated with a short course of palliative radiotherapy followed by systemic chemotherapy likely with Carboplatin and Paclitaxel.  MRI of the brain on 04/15/2020 was negative for metastatic disease.  Patient presented to ED on 05/12/20 with complaints of generalized weakness. He was started on systemic chemotherapy recently. Patient was noted to be hyponatremic and neutropenic. Had elevated D-dimer. He was noted to be tachycardic and to have rapid atrial fibrillation. He was hospitalized for further management. Chest x-ray taken during the visit showed that the lungs were well aerated bilaterally. A persistent central mass was noted along the minor fissure stable from the prior exam. Additional nodule was noted in the right upper lobe stable from the prior CT exam.   A chest CT was taken during the ED visit as well showing intraluminal nonocclusive filling defect within the right lower lobe pulmonary artery in keeping with eccentric mural thrombus or tumor thrombus related to probable direct invasion of the vessel by the adjacent neoplastic mass. Findings also showed a stable cavitary mass within the right upper lobe obliterating the posterior segmental bronchus of the right upper lobe, in keeping with the known primary neoplasm. It appeared to be confluent with the right hilar nodal station. A small satellite nodule within the right upper lobe was again noted.    Patient followed up with Dr. [name] on 05/26/20 regarding chemotherapy treatment. During the visit the patient had continued complaints of increased fatigure  and weakness due to chemo treatment. It is mentioned in both Dr. Worthy Flank note from 05/26/20 and Dr.  Theador Hawthorne note on 06/02/20 that the patient was also recently hospitalized for C.diff (date unknown).   Patient completed radiation therapy treatment on 06/04/20. Dose and rate information were as follows:  Lung_Rt : 24.00 of 24.00Gy, 12 of 12 fractions delivered Lung_Rt:1 : 4.00 of 4.00Gy, 2 of 2 fractions delivered    PREVIOUS RADIATION THERAPY: As above  PAST MEDICAL HISTORY:  Past Medical History:  Diagnosis Date  . Dyspnea   . Family history of adverse reaction to anesthesia    brother with seizures had episode under anesthesia.  Patient has seizures as well.  [name] GERD (gastroesophageal reflux disease)   . History of radiation therapy 04/24/2020-05/16/2020   IMRT to right lung     Dr [name]  . PAF (paroxysmal atrial fibrillation) ([facility])    CHADS2VSAC score 0  . Rheumatoid aortitis   . Squamous cell lung cancer ([place])     PAST SURGICAL HISTORY: Past Surgical History:  Procedure Laterality Date  . BRONCHIAL BRUSHINGS  03/27/2020   Procedure: BRONCHIAL BRUSHINGS;  Surgeon: [name], MD;  Location: [facility] ENDOSCOPY;  Service: Cardiopulmonary;;  . CATARACT EXTRACTION  2016   at [facility]  . FINE NEEDLE ASPIRATION  03/27/2020   Procedure: FINE NEEDLE ASPIRATION;  Surgeon: [name], MD;  Location[facility] ENDOSCOPY;  Service: Cardiopulmonary;;  . HIP SURGERY Left   . VIDEO BRONCHOSCOPY WITH ENDOBRONCHIAL ULTRASOUND N/A 03/27/2020   Procedure: VIDEO BRONCHOSCOPY WITH ENDOBRONCHIAL ULTRASOUND;  Surgeon: [name], MD;  Location: [place];  Service: Cardiopulmonary;  Laterality: N/A;    FAMILY HISTORY:  Family History  Problem Relation Age of Onset  . Arrhythmia Mother        has PPM  . Heart disease Father        Died at 79, started in his 28s, heart attacks, had PPM and ICD    SOCIAL HISTORY:  Social History   Tobacco Use  . Smoking status: Former Smoker    Packs/day: 1.00    Years: 30.00    Pack years: 30.00    Types: Cigarettes    Quit date: 2018     Years since quitting: 4.3  . Smokeless tobacco: Never Used  Vaping Use  . Vaping Use: Never used  Substance Use Topics  . Alcohol use: Not Currently  . Drug use: Never    ALLERGIES: No Known Allergies  MEDICATIONS:  Current Outpatient Medications  Medication Sig Dispense Refill  . acetaminophen (TYLENOL) 500 MG tablet Take 500 mg by mouth every 6 (six) hours as needed for mild pain or moderate pain.    [name] amiodarone (PACERONE) 200 MG tablet Take 1 tablet (200 mg total) by mouth daily. 90 tablet 3  . CARBOplatin 1000 MG/100ML SOLN Inject into the vein.    [name] levalbuterol (XOPENEX HFA) 45 MCG/ACT inhaler Inhale 2 puffs into the lungs every 4 (four) hours as needed for wheezing. 1 each 12  . metoprolol tartrate (LOPRESSOR) 25 MG tablet Take 1 tablet (25 mg total) by mouth 2 (two) times daily. 180 tablet 3  . PACLitaxel 100 MG/16.67ML CONC Inject into the vein.    [name] PARoxetine (PAXIL) 10 MG tablet Take 10 mg by mouth daily.    [name] PRILOSEC OTC 20 MG tablet Take 20 mg by mouth at bedtime.    Karle Barr (ZARXIO) 480 MCG/0.8ML SOSY injection Injection 0.8 ml daily for 3 days (  Patient not taking: Reported on 07/07/2020) 2.4 mL 0  . gabapentin (NEURONTIN) 100 MG capsule Take 1 capsule (100 mg total) by mouth at bedtime. (Patient not taking: Reported on 07/07/2020) 30 capsule 0  . leflunomide (ARAVA) 20 MG tablet Take 1 tablet (20 mg total) by mouth at bedtime. MAY RESUME AFTER COMPLETING TREATMENT FOR C-DIFF (Patient not taking: Reported on 07/07/2020)    . pegfilgrastim-bmez (ZIEXTENZO) 6 MG/0.6ML injection Inject 0.6 mL into the skin 2 days after chemotherapy (Patient not taking: Reported on 07/07/2020) 0.6 mL 3  . prochlorperazine (COMPAZINE) 10 MG tablet Take 1 tablet (10 mg total) by mouth every 6 (six) hours as needed for nausea or vomiting. (Patient not taking: Reported on 07/07/2020) 30 tablet 0  . SIMPONI 50 MG/0.5ML SOAJ Inject 50 mg into the skin every 30 (thirty) days. MAY RESUME AFTER  COMPLETING TREATMENT FOR C-DIFF (Patient not taking: Reported on 07/07/2020) 0.3 mL   . sodium chloride 0.9 % SOLN 50 mL with pembrolizumab 100 MG/4ML SOLN 2 mg/kg Inject 2 mg/kg into the vein. (Patient not taking: Reported on 07/07/2020)     No current facility-administered medications for this encounter.    REVIEW OF SYSTEMS:  A 10+ POINT REVIEW OF SYSTEMS WAS OBTAINED including neurology, dermatology, psychiatry, cardiac, respiratory, lymph, extremities, GI, GU, musculoskeletal, constitutional, reproductive, HEENT.   The patient denies any pain within the chest significant cough or hemoptysis.  He reports his breathing improved since his palliative radiation therapy and systemic therapy.   PHYSICAL EXAM:  height is 5\' 11"  (1.803 m) and weight is 204 lb 3.2 oz (92.6 kg). His temperature is 97.5 F (36.4 C) (abnormal). His blood pressure is 106/75 and his pulse is 95. His respiration is 18 and oxygen saturation is 97%.   General: Alert and oriented, in no acute distress HEENT: Head is normocephalic. Extraocular movements are intact. Oropharynx is clear. Neck: Neck is supple, no palpable cervical or supraclavicular lymphadenopathy. Heart: Regular in rate and rhythm with no murmurs, rubs, or gallops. Chest: Clear to auscultation bilaterally, with no rhonchi, wheezes, or rales. Abdomen: Soft, nontender, nondistended, with no rigidity or guarding.     LABORATORY DATA:  Lab Results  Component Value Date   WBC 2.6 (L) 07/07/2020   HGB 10.3 (L) 07/07/2020   HCT 32.9 (L) 07/07/2020   MCV 87.0 07/07/2020   PLT 293 07/07/2020   NEUTROABS 0.8 (L) 07/07/2020   Lab Results  Component Value Date   NA 136 07/07/2020   K 3.9 07/07/2020   CL 101 07/07/2020   CO2 27 07/07/2020   GLUCOSE 107 (H) 07/07/2020   CREATININE 0.87 07/07/2020   CALCIUM 9.2 07/07/2020      RADIOGRAPHY: No results found.    IMPRESSION: Stage IIIb/IV (T4, N2, M0/M1, C) squamous cell carcinoma of the right upper  lobe  Patient will proceed with additional imaging in early June.  At this time I do not see any obvious indications for additional palliative radiation therapy.  Patient has been intolerant of chemotherapy and therefore will proceed with immunotherapy alone per discussion with Dr. [name] today.   PLAN: As needed follow-up in radiation oncology    ------------------------------------------------  Blair Promise, PhD, MD  This document serves as a record of services personally performed by [name], MD. It was created on his behalf by Roney Mans, a trained medical scribe. The creation of this record is based on the scribe's personal observations and the provider's statements to them. This document has been  checked and approved by the attending provider.

## 2020-07-07 ENCOUNTER — Other Ambulatory Visit: Payer: 59

## 2020-07-07 ENCOUNTER — Inpatient Hospital Stay: Payer: 59

## 2020-07-07 ENCOUNTER — Ambulatory Visit: Payer: 59

## 2020-07-07 ENCOUNTER — Ambulatory Visit
Admission: RE | Admit: 2020-07-07 | Discharge: 2020-07-07 | Disposition: A | Discharge status: Home / Self Care | Payer: 59 | Source: Ambulatory Visit | Attending: Radiation Oncology | Admitting: Radiation Oncology

## 2020-07-07 ENCOUNTER — Encounter: Payer: Self-pay | Admitting: Radiation Oncology

## 2020-07-07 ENCOUNTER — Other Ambulatory Visit: Payer: Self-pay

## 2020-07-07 ENCOUNTER — Telehealth: Payer: Self-pay | Admitting: Physician Assistant

## 2020-07-07 ENCOUNTER — Ambulatory Visit: Payer: 59 | Admitting: Internal Medicine

## 2020-07-07 VITALS — BP 106/75 | HR 95 | Temp 97.5°F | Resp 18 | Ht 71.0 in | Wt 204.2 lb

## 2020-07-07 DIAGNOSIS — G629 Polyneuropathy, unspecified: Secondary | ICD-10-CM

## 2020-07-07 DIAGNOSIS — Z923 Personal history of irradiation: Secondary | ICD-10-CM | POA: Diagnosis not present

## 2020-07-07 DIAGNOSIS — C3411 Malignant neoplasm of upper lobe, right bronchus or lung: Secondary | ICD-10-CM | POA: Diagnosis present

## 2020-07-07 DIAGNOSIS — C3491 Malignant neoplasm of unspecified part of right bronchus or lung: Secondary | ICD-10-CM

## 2020-07-07 DIAGNOSIS — I480 Paroxysmal atrial fibrillation: Secondary | ICD-10-CM | POA: Insufficient documentation

## 2020-07-07 DIAGNOSIS — K219 Gastro-esophageal reflux disease without esophagitis: Secondary | ICD-10-CM | POA: Insufficient documentation

## 2020-07-07 DIAGNOSIS — Z79899 Other long term (current) drug therapy: Secondary | ICD-10-CM | POA: Insufficient documentation

## 2020-07-07 DIAGNOSIS — M069 Rheumatoid arthritis, unspecified: Secondary | ICD-10-CM | POA: Insufficient documentation

## 2020-07-07 DIAGNOSIS — Z5112 Encounter for antineoplastic immunotherapy: Secondary | ICD-10-CM | POA: Diagnosis not present

## 2020-07-07 DIAGNOSIS — Z87891 Personal history of nicotine dependence: Secondary | ICD-10-CM | POA: Diagnosis not present

## 2020-07-07 LAB — CBC WITH DIFFERENTIAL (CANCER CENTER ONLY)
Abs Immature Granulocytes: 0.01 10*3/uL (ref 0.00–0.07)
Basophils Absolute: 0.1 10*3/uL (ref 0.0–0.1)
Basophils Relative: 2 %
Eosinophils Absolute: 0.1 10*3/uL (ref 0.0–0.5)
Eosinophils Relative: 2 %
HCT: 32.9 % — ABNORMAL LOW (ref 39.0–52.0)
Hemoglobin: 10.3 g/dL — ABNORMAL LOW (ref 13.0–17.0)
Immature Granulocytes: 0 %
Lymphocytes Relative: 41 %
Lymphs Abs: 1.1 10*3/uL (ref 0.7–4.0)
MCH: 27.2 pg (ref 26.0–34.0)
MCHC: 31.3 g/dL (ref 30.0–36.0)
MCV: 87 fL (ref 80.0–100.0)
Monocytes Absolute: 0.7 10*3/uL (ref 0.1–1.0)
Monocytes Relative: 25 %
Neutro Abs: 0.8 10*3/uL — ABNORMAL LOW (ref 1.7–7.7)
Neutrophils Relative %: 30 %
Platelet Count: 293 10*3/uL (ref 150–400)
RBC: 3.78 MIL/uL — ABNORMAL LOW (ref 4.22–5.81)
RDW: 17 % — ABNORMAL HIGH (ref 11.5–15.5)
WBC Count: 2.6 10*3/uL — ABNORMAL LOW (ref 4.0–10.5)
nRBC: 0 % (ref 0.0–0.2)

## 2020-07-07 LAB — CMP (CANCER CENTER ONLY)
ALT: 34 U/L (ref 0–44)
AST: 44 U/L — ABNORMAL HIGH (ref 15–41)
Albumin: 2.6 g/dL — ABNORMAL LOW (ref 3.5–5.0)
Alkaline Phosphatase: 160 U/L — ABNORMAL HIGH (ref 38–126)
Anion gap: 8 (ref 5–15)
BUN: 9 mg/dL (ref 6–20)
CO2: 27 mmol/L (ref 22–32)
Calcium: 9.2 mg/dL (ref 8.9–10.3)
Chloride: 101 mmol/L (ref 98–111)
Creatinine: 0.87 mg/dL (ref 0.61–1.24)
GFR, Estimated: >60 mL/min (ref >60)
Glucose, Bld: 107 mg/dL — ABNORMAL HIGH (ref 70–99)
Potassium: 3.9 mmol/L (ref 3.5–5.1)
Sodium: 136 mmol/L (ref 135–145)
Total Bilirubin: 0.9 mg/dL (ref 0.3–1.2)
Total Protein: 7.4 g/dL (ref 6.5–8.1)

## 2020-07-07 LAB — VITAMIN B12: Vitamin B-12: 250 pg/mL (ref 180–914)

## 2020-07-07 LAB — TSH: TSH: 3.011 u[IU]/mL (ref 0.320–4.118)

## 2020-07-07 NOTE — Telephone Encounter (Signed)
I called the patient to let him know that his B12 is borderline low. I recommended B12 supplements. I left a voicemail and if he has questions, I left our call back number.

## 2020-07-07 NOTE — Progress Notes (Signed)
See MD note for nursing evaluation. °

## 2020-07-09 ENCOUNTER — Ambulatory Visit: Payer: 59

## 2020-07-15 ENCOUNTER — Ambulatory Visit: Payer: 59 | Admitting: Internal Medicine

## 2020-07-15 ENCOUNTER — Telehealth: Payer: Self-pay | Admitting: Internal Medicine

## 2020-07-15 ENCOUNTER — Other Ambulatory Visit: Payer: Self-pay

## 2020-07-15 ENCOUNTER — Other Ambulatory Visit: Payer: 59

## 2020-07-15 ENCOUNTER — Inpatient Hospital Stay: Payer: 59

## 2020-07-15 ENCOUNTER — Inpatient Hospital Stay: Payer: 59 | Admitting: Dietician

## 2020-07-15 ENCOUNTER — Ambulatory Visit: Payer: 59

## 2020-07-15 DIAGNOSIS — Z5112 Encounter for antineoplastic immunotherapy: Secondary | ICD-10-CM | POA: Diagnosis not present

## 2020-07-15 DIAGNOSIS — C3491 Malignant neoplasm of unspecified part of right bronchus or lung: Secondary | ICD-10-CM

## 2020-07-15 LAB — CMP (CANCER CENTER ONLY)
ALT: 27 U/L (ref 0–44)
AST: 29 U/L (ref 15–41)
Albumin: 2.6 g/dL — ABNORMAL LOW (ref 3.5–5.0)
Alkaline Phosphatase: 123 U/L (ref 38–126)
Anion gap: 9 (ref 5–15)
BUN: 10 mg/dL (ref 6–20)
CO2: 26 mmol/L (ref 22–32)
Calcium: 8.7 mg/dL — ABNORMAL LOW (ref 8.9–10.3)
Chloride: 102 mmol/L (ref 98–111)
Creatinine: 0.93 mg/dL (ref 0.61–1.24)
GFR, Estimated: >60 mL/min (ref >60)
Glucose, Bld: 105 mg/dL — ABNORMAL HIGH (ref 70–99)
Potassium: 4 mmol/L (ref 3.5–5.1)
Sodium: 137 mmol/L (ref 135–145)
Total Bilirubin: 0.7 mg/dL (ref 0.3–1.2)
Total Protein: 7 g/dL (ref 6.5–8.1)

## 2020-07-15 LAB — CBC WITH DIFFERENTIAL (CANCER CENTER ONLY)
Abs Immature Granulocytes: 0.01 10*3/uL (ref 0.00–0.07)
Basophils Absolute: 0 10*3/uL (ref 0.0–0.1)
Basophils Relative: 1 %
Eosinophils Absolute: 0.1 10*3/uL (ref 0.0–0.5)
Eosinophils Relative: 3 %
HCT: 32.6 % — ABNORMAL LOW (ref 39.0–52.0)
Hemoglobin: 10.4 g/dL — ABNORMAL LOW (ref 13.0–17.0)
Immature Granulocytes: 0 %
Lymphocytes Relative: 41 %
Lymphs Abs: 1 10*3/uL (ref 0.7–4.0)
MCH: 27.4 pg (ref 26.0–34.0)
MCHC: 31.9 g/dL (ref 30.0–36.0)
MCV: 85.8 fL (ref 80.0–100.0)
Monocytes Absolute: 0.6 10*3/uL (ref 0.1–1.0)
Monocytes Relative: 25 %
Neutro Abs: 0.7 10*3/uL — ABNORMAL LOW (ref 1.7–7.7)
Neutrophils Relative %: 30 %
Platelet Count: 277 10*3/uL (ref 150–400)
RBC: 3.8 MIL/uL — ABNORMAL LOW (ref 4.22–5.81)
RDW: 17 % — ABNORMAL HIGH (ref 11.5–15.5)
WBC Count: 2.5 10*3/uL — ABNORMAL LOW (ref 4.0–10.5)
nRBC: 0 % (ref 0.0–0.2)

## 2020-07-15 NOTE — Progress Notes (Signed)
Nutrition Assessment   Reason for Assessment: MST   ASSESSMENT: 60 year old male with stage IIIb right lung cancer. He completed radiation therapy on 4/202/22. Patient currently receiving single agent  immunotherapy with Keytruda due to neutropenia with chemotherapy. He is followed by Dr. [name].  Past medical history of atrial fibrillation with RVR, occulusion of right pulmonary artery, orthostatic hypotension, COPD with asthma.   Met with patient in clinic. He reports appetite has been improving in the last couple of weeks. Patient reports he continues to have dry mouth and feels fatigued. 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 Patient reports he has lost 36 lbs since diagnosis. Patient reports he was overweight due to drinking "way too much" [place]. Dew, says he was drinking a 2L/day. Patient stopped drinking [place]. Dew a few months ago, reports he expected to lose some weight after he stopped drinking this a few months ago. Patient reports losing his hair, appetite, energy, and additional weight after the first chemotherapy. He reports currently eating more protein foods, switching to whole milk, and drinking CIB as well as Boost Plus. Patient reports supplements will sometimes give him diarrhea.   Medications: Gabapentin, Prilosec, Compazine, B12   Labs: Glucose 105  Anthropometrics: Patient is 16 lbs (7.3%) under his reported usual weight. Weights have decreased 32 lbs (13.6%) in 3 months which is significant.   Height: 5'11" Weight: 204 lb UBW: 215-220 lb (per pt) BMI: 28.48   NUTRITION DIAGNOSIS: Unintentional weight loss related to cancer and associated treatments as evidenced by 7.3% decrease from reported usual weight which is significant   INTERVENTION:  Educated on small frequent meals and snacks that are high in calories and protein Discussed ways to add calories and protein to meals -  adding cheese, cooking oatmeal/soups with milk, using butter Provided high calorie, high protein snack ideas, handout given Recipes for high calorie, high protein shakes Discussed strategies for dry mouth, handout provided Recommended  baking soda/salt water rinses, recipe given Encouraged drinking 2-3 Ensure Plus/equivalent (350, 16 grams protein) daily Suggested cutting supplement with milk to improve tolerance Provided samples of [employer]   MONITORING, EVALUATION, GOAL: Patient will tolerate increased calories and protein to minimize further weight loss   Next Visit: telephone f/u Wednesday June 15

## 2020-07-15 NOTE — Telephone Encounter (Signed)
R/s appts per 5/26 sch msg. Pt aware.

## 2020-07-17 NOTE — Progress Notes (Signed)
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 He wasdiagnosed in February 2022.He also has rheumatoid arthritis.  PDL1:70%  PRIOR THERAPY: Palliative radiotherapy to the large right upper lobe lung mass and mediastinal lymphadenopathy under the care of Dr. [name]  CURRENT THERAPY: Systemic chemotherapy with carboplatin for AUC of 5, paclitaxel 175 mg/M2 and Keytruda 200 mg IV every 3 weeks with Neulasta support. First dose May 05, 2020. Status post 3 cycles.Starting from cycle #2, the patient will be on single agent immunotherapy with Keytruda due to neutropenia with chemotherapy.   INTERVAL HISTORY: John [name] 60 y.o. male returns to the clinic today for a follow-up visit accompanied by his wife.  The patient is feeling fairly well today without any concerning complaints. The patient is currently undergoing single agent immunotherapy with Keytruda due to intolerance to chemotherapy.  He has been having prolonged neutropenia that was not responsive to neulasta or zarxio. He tolerated his last cycle of treatment fairly well.  He denies any recent fever, night sweats, or chills.    He previously had weight loss and decreased appetite which improved, thought his weight is stable.  He had a follow-up visit with a member of the nutritionist team recently and he is currently drinking ensure/boost 3x per day. They are going to follow up with him again later this month. Reports some shortness of breath with exertion.  He also has a cough when he lays down at nighttime. His wife thinks that his cough  has worsened recently. He takes Prilosec for heartburn.  He denies any hemoptysis.  He denies any chest pain.  He denies any nausea, vomiting, or constipation.  He denies any diarrhea.  He denies any headache or visual changes.  He denies any rashes or skin changes.  The patient recently had a restaging CT scan performed.  The patient is here today for evaluation before starting cycle #4.    MEDICAL HISTORY: Past Medical History:  Diagnosis Date  . Dyspnea   . Family history of adverse reaction to anesthesia    brother with seizures had episode under anesthesia.  Patient has seizures as well.  [name] GERD (gastroesophageal reflux disease)   . History of radiation therapy 04/24/2020-05/16/2020   IMRT to right lung     Dr [name]  . PAF (paroxysmal atrial fibrillation) (HCC)    CHADS2VSAC score 0  . Rheumatoid aortitis   . Squamous cell lung cancer (HCC)     ALLERGIES:  has No Known Allergies.  MEDICATIONS:  Current Outpatient Medications  Medication Sig Dispense Refill  . acetaminophen (TYLENOL) 500 MG tablet Take 500 mg by mouth every 6 (six) hours as needed for mild pain or moderate pain.    [name] amiodarone (PACERONE) 200 MG tablet Take 1 tablet (200 mg total) by mouth daily. 90 tablet 3  . CARBOplatin 1000 MG/100ML SOLN Inject into the vein.    [name] levalbuterol (XOPENEX HFA) 45 MCG/ACT inhaler Inhale 2 puffs into the lungs every 4 (four) hours as needed for wheezing. 1 each 12  . metoprolol tartrate (LOPRESSOR) 25 MG tablet Take 1 tablet (25 mg total) by mouth 2 (two) times  daily. 180 tablet 3  . PACLitaxel 100 MG/16.67ML CONC Inject into the vein.    [name] PARoxetine (PAXIL) 10 MG tablet Take 10 mg by mouth daily.    [name] PRILOSEC OTC 20 MG tablet Take 20 mg by mouth at bedtime.    Karle Barr (ZARXIO) 480 MCG/0.8ML SOSY injection Injection 0.8 ml daily for 3 days (Patient not taking: No sig reported) 2.4 mL 0  . gabapentin (NEURONTIN) 100 MG capsule Take 1 capsule (100 mg total) by mouth  at bedtime. (Patient not taking: No sig reported) 30 capsule 0  . leflunomide (ARAVA) 20 MG tablet Take 1 tablet (20 mg total) by mouth at bedtime. MAY RESUME AFTER COMPLETING TREATMENT FOR C-DIFF (Patient not taking: No sig reported)    . pegfilgrastim-bmez (ZIEXTENZO) 6 MG/0.6ML injection Inject 0.6 mL into the skin 2 days after chemotherapy (Patient not taking: No sig reported) 0.6 mL 3  . prochlorperazine (COMPAZINE) 10 MG tablet Take 1 tablet (10 mg total) by mouth every 6 (six) hours as needed for nausea or vomiting. (Patient not taking: No sig reported) 30 tablet 0  . SIMPONI 50 MG/0.5ML SOAJ Inject 50 mg into the skin every 30 (thirty) days. MAY RESUME AFTER COMPLETING TREATMENT FOR C-DIFF (Patient not taking: No sig reported) 0.3 mL   . sodium chloride 0.9 % SOLN 50 mL with pembrolizumab 100 MG/4ML SOLN 2 mg/kg Inject 2 mg/kg into the vein. (Patient not taking: No sig reported)     No current facility-administered medications for this visit.    SURGICAL HISTORY:  Past Surgical History:  Procedure Laterality Date  . BRONCHIAL BRUSHINGS  03/27/2020   Procedure: BRONCHIAL BRUSHINGS;  Surgeon: [name], MD;  Location: [facility] ENDOSCOPY;  Service: Cardiopulmonary;;  . CATARACT EXTRACTION  2016   at [facility]  . FINE NEEDLE ASPIRATION  03/27/2020   Procedure: FINE NEEDLE ASPIRATION;  Surgeon: [name], MD;  Location: [facility] ENDOSCOPY;  Service: Cardiopulmonary;;  . HIP SURGERY Left   . VIDEO BRONCHOSCOPY WITH ENDOBRONCHIAL ULTRASOUND N/A 03/27/2020   Procedure: VIDEO BRONCHOSCOPY WITH ENDOBRONCHIAL ULTRASOUND;  Surgeon: [name], MD;  Location: [place];  Service: Cardiopulmonary;  Laterality: N/A;    REVIEW OF SYSTEMS:   Review of Systems  Constitutional: Positive for fatigue. Negative for appetite change, chills, fever and unexpected weight change.  HENT: Negative for mouth sores, nosebleeds, sore throat and trouble swallowing.   Eyes: Negative for eye problems and icterus.   Respiratory: Positive for baseline dyspnea on exertion. Negative for cough, hemoptysis, and wheezing.   Cardiovascular: Negative chest pain and for leg swelling.  Gastrointestinal: Negative for abdominal pain, constipation, diarrhea, nausea and vomiting.  Genitourinary: Negative for bladder incontinence, difficulty urinating, dysuria, frequency and hematuria.   Musculoskeletal: Negative for back pain, gait problem, neck pain and neck stiffness.  Skin: Negative for itching and rash.  Neurological: Negative for dizziness, extremity weakness, gait problem, headaches, light-headedness and seizures.  Hematological: Negative for adenopathy. Does not bruise/bleed easily.  Psychiatric/Behavioral: Negative for confusion, depression and sleep disturbance. The patient is not nervous/anxious.   PHYSICAL EXAMINATION:  Blood pressure 104/68, pulse 72, temperature 97.6 F (36.4 C), resp. rate 20, weight 202 lb 12.8 oz (92 kg), SpO2 95 %.  ECOG PERFORMANCE STATUS: 1 - Symptomatic but completely ambulatory  Physical Exam  Constitutional: Oriented to person, place, and time and well-developed, well-nourished, and in no distress.  HENT:  Head: Normocephalic and atraumatic.  Mouth/Throat: Oropharynx is clear and moist. No oropharyngeal exudate.  Eyes:  Conjunctivae are normal. Right eye exhibits no discharge. Left eye exhibits no discharge. No scleral icterus.  Neck: Normal range of motion. Neck supple.  Cardiovascular: Normal rate, regular rhythm, normal heart sounds and intact distal pulses.   Pulmonary/Chest: Effort normal and breath sounds normal. No respiratory distress. No wheezes. No rales.  Abdominal: Soft. Bowel sounds are normal. Exhibits no distension and no mass. There is no tenderness.  Musculoskeletal: Normal range of motion. Exhibits no edema.  Lymphadenopathy:    No cervical adenopathy.  Neurological: Alert and oriented to person, place, and time. Exhibits normal muscle tone. Gait normal.  Coordination normal.  Skin: Skin is warm and dry. No rash noted. Not diaphoretic. No erythema. No pallor.  Psychiatric: Mood, memory and judgment normal.  Vitals reviewed.  LABORATORY DATA: Lab Results  Component Value Date   WBC 2.3 (L) 07/22/2020   HGB 10.7 (L) 07/22/2020   HCT 33.2 (L) 07/22/2020   MCV 86.2 07/22/2020   PLT 219 07/22/2020      Chemistry      Component Value Date/Time   NA 137 07/22/2020 1143   K 3.6 07/22/2020 1143   CL 103 07/22/2020 1143   CO2 24 07/22/2020 1143   BUN 10 07/22/2020 1143   CREATININE 0.94 07/22/2020 1143      Component Value Date/Time   CALCIUM 8.6 (L) 07/22/2020 1143   ALKPHOS 100 07/22/2020 1143   AST 22 07/22/2020 1143   ALT 19 07/22/2020 1143   BILITOT 0.5 07/22/2020 1143       RADIOGRAPHIC STUDIES:  CT Chest W Contrast  Result Date: 07/18/2020 CLINICAL DATA:  Primary Cancer Type: Lung Imaging Indication: Assess response to therapy Interval therapy since last imaging? Yes Initial Cancer Diagnosis Date: 03/27/2020; Established by: Biopsy-proven Detailed Pathology: Stage IIIb/IV non-small cell lung cancer, squamous cell carcinoma. Primary Tumor location: Right upper lobe.  Right adrenal mass. Surgeries: No. Chemotherapy: Yes; Ongoing? No; Most recent administration: 05/05/2020 Immunotherapy?  Yes; Type: Keytruda; Ongoing? No Radiation therapy? Yes; Date Range: 04/25/2020 - 05/16/2020; Target: Right lung EXAM: CT CHEST, ABDOMEN, AND PELVIS WITH CONTRAST TECHNIQUE: Multidetector CT imaging of the chest, abdomen and pelvis was performed following the standard protocol during bolus administration of intravenous contrast. CONTRAST:  134m OMNIPAQUE IOHEXOL 300 MG/ML  SOLN COMPARISON:  Most recent CT chest 05/12/2020.  04/29/2020 PET-CT. FINDINGS: CT CHEST FINDINGS Cardiovascular: Normal heart size. No significant pericardial effusion/thickening. Left anterior descending and left circumflex coronary atherosclerosis. Atherosclerotic nonaneurysmal  thoracic aorta. Normal caliber pulmonary arteries. No central pulmonary emboli. Mediastinum/Nodes: No discrete thyroid nodules. Unremarkable esophagus. No axillary adenopathy. Stable mildly enlarged 1.2 cm right hilar node (series 2/image 24). No pathologically enlarged mediastinal or left hilar nodes. Lungs/Pleura: No pneumothorax. No pleural effusion. Mild centrilobular and paraseptal emphysema with diffuse bronchial wall thickening. Solid peripheral right upper lobe 1.1 cm pulmonary nodule (series 6/image 34), previously 1.1 cm using similar measurement technique, stable. New large region of fairly sharply marginated patchy consolidation and ground-glass opacity throughout the perihilar right mid lung, compatible with early postradiation change, encompassing cavitary posterior right upper lobe lung mass, which measures 5.0 x 3.2 cm (series 6/image 57), decreased from 7.1 x 4.5 cm, and which is increasingly cavitary. No new significant pulmonary nodules. New mild to moderate patchy subpleural reticulation and ground-glass opacity throughout both lungs. Musculoskeletal: No aggressive appearing focal osseous lesions. Mild thoracic spondylosis. CT ABDOMEN PELVIS FINDINGS Hepatobiliary: Normal liver with no liver mass. Cholelithiasis. No biliary ductal dilatation. Pancreas: Normal, with no mass or duct  dilation. Spleen: Mild splenomegaly. Craniocaudal splenic length 15.6 cm, previously 15.2 cm on PET-CT, similar. Several wedge-shaped subcapsular low-attenuation foci throughout the periphery of the spleen, not apparent on previous noncontrast studies, favor splenic infarcts. No discrete splenic masses. Adrenals/Urinary Tract: Solid right adrenal 4.5 cm mass with density 33 HU, decreased from 6.8 cm on 05/12/2020 CT. No left adrenal nodules. Subcentimeter hypodense posterior interpolar left renal cortical lesion is too small to characterize and requires no follow-up. Otherwise normal kidneys, with no hydronephrosis.  Normal bladder. Stomach/Bowel: Normal non-distended stomach. Normal caliber small bowel with no small bowel wall thickening. Normal appendix. Oral contrast transits to the rectum. Normal large bowel with no diverticulosis, large bowel wall thickening or pericolonic fat stranding. Vascular/Lymphatic: Atherosclerotic abdominal aorta with mildly dilated 2.8 cm infrarenal abdominal aorta. Patent portal, splenic, hepatic and renal veins. No pathologically enlarged lymph nodes in the abdomen or pelvis. Reproductive: Normal size prostate. Other: No pneumoperitoneum, ascites or focal fluid collection. Haziness of the central mesenteric fat is mildly increased, without associated adenopathy or masses. Musculoskeletal: No aggressive appearing focal osseous lesions. Chronic mild L2 vertebral compression deformity. Mild lumbar spondylosis. IMPRESSION: 1. Interval positive response to therapy. Cavitary posterior right upper lobe mass is decreased in size and is increasingly cavitary. Solid peripheral right upper lobe 1.1 cm pulmonary nodule, stable. Stable mild right hilar adenopathy. Right adrenal metastasis is decreased. No new or progressive metastatic disease. 2. New mild to moderate patchy subpleural reticulation and ground-glass opacity throughout both lungs, nonspecific, consider pulmonary drug toxicity. 3. New prominent early postradiation change in the right mid lung surrounding the lung mass. 4. Mild splenomegaly, stable. Several subcapsular splenic infarcts, not apparent on previous noncontrast study. 5. Increased haziness of the central mesenteric fat without associated adenopathy or masses, nonspecific, favor nonspecific inflammatory or noninflammatory edema. 6. Aortic Atherosclerosis (ICD10-I70.0). Electronically Signed   By: Ilona Sorrel M.D.   On: 07/18/2020 11:05   CT Abdomen Pelvis W Contrast  Result Date: 07/18/2020 CLINICAL DATA:  Primary Cancer Type: Lung Imaging Indication: Assess response to therapy  Interval therapy since last imaging? Yes Initial Cancer Diagnosis Date: 03/27/2020; Established by: Biopsy-proven Detailed Pathology: Stage IIIb/IV non-small cell lung cancer, squamous cell carcinoma. Primary Tumor location: Right upper lobe.  Right adrenal mass. Surgeries: No. Chemotherapy: Yes; Ongoing? No; Most recent administration: 05/05/2020 Immunotherapy?  Yes; Type: Keytruda; Ongoing? No Radiation therapy? Yes; Date Range: 04/25/2020 - 05/16/2020; Target: Right lung EXAM: CT CHEST, ABDOMEN, AND PELVIS WITH CONTRAST TECHNIQUE: Multidetector CT imaging of the chest, abdomen and pelvis was performed following the standard protocol during bolus administration of intravenous contrast. CONTRAST:  192m OMNIPAQUE IOHEXOL 300 MG/ML  SOLN COMPARISON:  Most recent CT chest 05/12/2020.  04/29/2020 PET-CT. FINDINGS: CT CHEST FINDINGS Cardiovascular: Normal heart size. No significant pericardial effusion/thickening. Left anterior descending and left circumflex coronary atherosclerosis. Atherosclerotic nonaneurysmal thoracic aorta. Normal caliber pulmonary arteries. No central pulmonary emboli. Mediastinum/Nodes: No discrete thyroid nodules. Unremarkable esophagus. No axillary adenopathy. Stable mildly enlarged 1.2 cm right hilar node (series 2/image 24). No pathologically enlarged mediastinal or left hilar nodes. Lungs/Pleura: No pneumothorax. No pleural effusion. Mild centrilobular and paraseptal emphysema with diffuse bronchial wall thickening. Solid peripheral right upper lobe 1.1 cm pulmonary nodule (series 6/image 34), previously 1.1 cm using similar measurement technique, stable. New large region of fairly sharply marginated patchy consolidation and ground-glass opacity throughout the perihilar right mid lung, compatible with early postradiation change, encompassing cavitary posterior right upper lobe lung mass, which measures 5.0 x 3.2 cm (series  6/image 57), decreased from 7.1 x 4.5 cm, and which is increasingly  cavitary. No new significant pulmonary nodules. New mild to moderate patchy subpleural reticulation and ground-glass opacity throughout both lungs. Musculoskeletal: No aggressive appearing focal osseous lesions. Mild thoracic spondylosis. CT ABDOMEN PELVIS FINDINGS Hepatobiliary: Normal liver with no liver mass. Cholelithiasis. No biliary ductal dilatation. Pancreas: Normal, with no mass or duct dilation. Spleen: Mild splenomegaly. Craniocaudal splenic length 15.6 cm, previously 15.2 cm on PET-CT, similar. Several wedge-shaped subcapsular low-attenuation foci throughout the periphery of the spleen, not apparent on previous noncontrast studies, favor splenic infarcts. No discrete splenic masses. Adrenals/Urinary Tract: Solid right adrenal 4.5 cm mass with density 33 HU, decreased from 6.8 cm on 05/12/2020 CT. No left adrenal nodules. Subcentimeter hypodense posterior interpolar left renal cortical lesion is too small to characterize and requires no follow-up. Otherwise normal kidneys, with no hydronephrosis. Normal bladder. Stomach/Bowel: Normal non-distended stomach. Normal caliber small bowel with no small bowel wall thickening. Normal appendix. Oral contrast transits to the rectum. Normal large bowel with no diverticulosis, large bowel wall thickening or pericolonic fat stranding. Vascular/Lymphatic: Atherosclerotic abdominal aorta with mildly dilated 2.8 cm infrarenal abdominal aorta. Patent portal, splenic, hepatic and renal veins. No pathologically enlarged lymph nodes in the abdomen or pelvis. Reproductive: Normal size prostate. Other: No pneumoperitoneum, ascites or focal fluid collection. Haziness of the central mesenteric fat is mildly increased, without associated adenopathy or masses. Musculoskeletal: No aggressive appearing focal osseous lesions. Chronic mild L2 vertebral compression deformity. Mild lumbar spondylosis. IMPRESSION: 1. Interval positive response to therapy. Cavitary posterior right upper  lobe mass is decreased in size and is increasingly cavitary. Solid peripheral right upper lobe 1.1 cm pulmonary nodule, stable. Stable mild right hilar adenopathy. Right adrenal metastasis is decreased. No new or progressive metastatic disease. 2. New mild to moderate patchy subpleural reticulation and ground-glass opacity throughout both lungs, nonspecific, consider pulmonary drug toxicity. 3. New prominent early postradiation change in the right mid lung surrounding the lung mass. 4. Mild splenomegaly, stable. Several subcapsular splenic infarcts, not apparent on previous noncontrast study. 5. Increased haziness of the central mesenteric fat without associated adenopathy or masses, nonspecific, favor nonspecific inflammatory or noninflammatory edema. 6. Aortic Atherosclerosis (ICD10-I70.0). Electronically Signed   By: Ilona Sorrel M.D.   On: 07/18/2020 11:05     ASSESSMENT/PLAN:  This is a very pleasant 60 year old white male recently diagnosed with stage IV (T4, N2, M0/M1 C)non-small cell lung cancer, squamous cell carcinoma. 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 Diagnosed in February 2022.His PDL1 expression is 70%  The patientcompletedpalliative radiotherapy to the large right upper lobe lung mass and mediastinal lymphadenopathy.  The patient started systemic chemotherapy with carboplatin for AUC of 5, paclitaxel 175 mg/M2 and Keytruda 200 mg IV every 3 weeks status post 3 cycles. He had a rough time with the first cycle of his treatment with admission with CHF, C. Diff, andatrial fibrillation.  He also had prolonged neutropenia. Starting with cycle #2, Dr. [name] recommendedthat we discontinue chemotherapy for now due to his intolerance with fatigue/weakness and prolonged neutropenia.  He is a good candidate for single agent immunotherapy with Keytruda due to his PDL1 expression of 70% which will not cause significant myelosuppression. Of course, if the patient's blood work in the future improves, we can always revisit adding  back chemotherapy or reduced dose chemotherapy.  The patient recently had a restaging CT scan performed.  Dr. [name] personally and independently reviewed the scan and discussed results with the patient today.  The scan showed an interval response to therapy. However there was some new mild to moderate patchy subpleural reticulation (R>L) which is non-specific but can be due to radiation change, especially considering it is worse on the right side where he received prior radiation. However, this could also be drug toxicity possible due to his immunotherapy or antiarrythmic medications.  Dr. [name] gave the patient the option of continuing on single agent immunotherapy with [facility] with close monitoring vs. Holding treatment while he completes a tapering dose of high dose steroids. Dr. [name] discussed given the intolerance to chemotherapy and prolonged neutropenia, it would be challenging treating him with chemotherapy in the future if needed. The patient would like to continue on treatment with immunotherapy. Of course, the patient knows to call with new or worsening respiratory complaints. His shortness of breath is greatly improved but he has a little bit of a cough which is worse at night.   He will proceed with cycle #4 today scheduled.  We will see him back for follow-up visit in 3 weeks for evaluation before starting cycle #5.  He still is neutropenic. Neutropenic precautions/pandemic precautions reviewed with the patient.   We will reach out to his cardiologist regarding #2 on his scan just to see whether that would change their choice of antiarrythmic medication.     The patient was advised to call immediately if he has any concerning symptoms in  the interval. The patient voices understanding of current disease status and treatment options and is in agreement with the current care plan. All questions were answered. The patient knows to call the clinic with any problems, questions or concerns. We can certainly see the patient much sooner if necessary  No orders of the defined types were placed in this encounter.   Curren Mohrmann L Trevyn Lumpkin, PA-C 07/22/20  ADDENDUM: Hematology/Oncology Attending: I had a face-to-face encounter with the patient today.  I reviewed his lab, scans and recommended his care plan.  This is a very pleasant 59 years old white male with stage IV (T4, N2, M1c) non-small cell lung cancer, squamous cell carcinoma status post palliative radiotherapy to the large right upper lobe lung mass.  The patient started systemic chemotherapy initially with carboplatin, paclitaxel and Keytruda but he has significant intolerability to the chemotherapy portion and he is switching to single agent Keytruda starting from cycle #2 secondary to severe neutropenia that has not recovered yet after the first dose of his chemotherapy and admission to the hospital with C. difficile and colitis. He has been tolerating his treatment with Keytruda fairly well.  The patient had repeat CT scan of the chest, abdomen pelvis performed recently.  I personally and independently reviewed the scan images and discussed the results with the patient and his wife. His scan showed increased consolidation in the right lung likely secondary to radiation treatment but immunotherapy mediated pneumonitis could not be excluded at this point. He also has improvement of his disease in other areas including the cavitary right upper lobe lung mass as well as decrease in the adrenal gland metastasis. I gave the patient the option of holding his treatment with Keytruda and treating him with a tapered dose of prednisone because of the suspicious pneumonitis but the patient  mentions that he is feeling well and he would like to  continue his treatment. We will proceed with cycle #4 of treatment with Keytruda at this point. Will continue to monitor him closely and if he has any concerning worsening of his dyspnea or cough, will consider him for a tapered dose of steroid. The patient is also on treatment with amiodarone which may be also a contributing factor for the drug-induced pneumonitis.  He will reach out to his cardiologist for discussion of alternative if possible. The patient will come back for follow-up visit in 3 weeks for evaluation before the next cycle of his treatment. He was advised to call immediately if he has any other concerning symptoms in the interval.  The total time spent in the appointment was 40 minutes. Disclaimer: This note was dictated with voice recognition software. Similar sounding words can inadvertently be transcribed and may be missed upon review. Eilleen Kempf, MD 07/22/20

## 2020-07-18 ENCOUNTER — Encounter (HOSPITAL_COMMUNITY): Payer: Self-pay

## 2020-07-18 ENCOUNTER — Other Ambulatory Visit: Payer: Self-pay

## 2020-07-18 ENCOUNTER — Ambulatory Visit (HOSPITAL_COMMUNITY)
Admission: RE | Admit: 2020-07-18 | Discharge: 2020-07-18 | Disposition: A | Discharge status: Home / Self Care | Payer: 59 | Source: Ambulatory Visit | Attending: Physician Assistant | Admitting: Physician Assistant

## 2020-07-18 DIAGNOSIS — C3491 Malignant neoplasm of unspecified part of right bronchus or lung: Secondary | ICD-10-CM

## 2020-07-18 MED ORDER — IOHEXOL 300 MG/ML  SOLN
100.0000 mL | Freq: Once | INTRAMUSCULAR | Status: AC | PRN
  Administered 2020-07-18: 100 mL via INTRAVENOUS

## 2020-07-18 MED ORDER — SODIUM CHLORIDE (PF) 0.9 % IJ SOLN
INTRAMUSCULAR | Status: AC
  Filled 2020-07-18: qty 50

## 2020-07-21 ENCOUNTER — Inpatient Hospital Stay: Payer: 59

## 2020-07-21 ENCOUNTER — Ambulatory Visit: Payer: 59 | Admitting: Internal Medicine

## 2020-07-21 ENCOUNTER — Inpatient Hospital Stay: Payer: 59 | Admitting: Physician Assistant

## 2020-07-22 ENCOUNTER — Inpatient Hospital Stay: Payer: 59

## 2020-07-22 ENCOUNTER — Encounter: Payer: Self-pay | Admitting: Physician Assistant

## 2020-07-22 ENCOUNTER — Inpatient Hospital Stay: Payer: 59 | Attending: Physician Assistant

## 2020-07-22 ENCOUNTER — Inpatient Hospital Stay (HOSPITAL_BASED_OUTPATIENT_CLINIC_OR_DEPARTMENT_OTHER): Payer: 59 | Admitting: Physician Assistant

## 2020-07-22 ENCOUNTER — Other Ambulatory Visit: Payer: Self-pay

## 2020-07-22 VITALS — BP 104/68 | HR 72 | Temp 97.6°F | Resp 20 | Wt 202.8 lb

## 2020-07-22 DIAGNOSIS — T451X5A Adverse effect of antineoplastic and immunosuppressive drugs, initial encounter: Secondary | ICD-10-CM | POA: Diagnosis not present

## 2020-07-22 DIAGNOSIS — Z5112 Encounter for antineoplastic immunotherapy: Secondary | ICD-10-CM

## 2020-07-22 DIAGNOSIS — C3411 Malignant neoplasm of upper lobe, right bronchus or lung: Secondary | ICD-10-CM | POA: Diagnosis present

## 2020-07-22 DIAGNOSIS — C3491 Malignant neoplasm of unspecified part of right bronchus or lung: Secondary | ICD-10-CM | POA: Diagnosis not present

## 2020-07-22 DIAGNOSIS — Z923 Personal history of irradiation: Secondary | ICD-10-CM | POA: Insufficient documentation

## 2020-07-22 DIAGNOSIS — I480 Paroxysmal atrial fibrillation: Secondary | ICD-10-CM | POA: Diagnosis not present

## 2020-07-22 DIAGNOSIS — Z79899 Other long term (current) drug therapy: Secondary | ICD-10-CM | POA: Insufficient documentation

## 2020-07-22 DIAGNOSIS — C7971 Secondary malignant neoplasm of right adrenal gland: Secondary | ICD-10-CM | POA: Insufficient documentation

## 2020-07-22 DIAGNOSIS — D702 Other drug-induced agranulocytosis: Secondary | ICD-10-CM | POA: Insufficient documentation

## 2020-07-22 LAB — CBC WITH DIFFERENTIAL (CANCER CENTER ONLY)
Abs Immature Granulocytes: 0.01 10*3/uL (ref 0.00–0.07)
Basophils Absolute: 0 10*3/uL (ref 0.0–0.1)
Basophils Relative: 1 %
Eosinophils Absolute: 0.1 10*3/uL (ref 0.0–0.5)
Eosinophils Relative: 5 %
HCT: 33.2 % — ABNORMAL LOW (ref 39.0–52.0)
Hemoglobin: 10.7 g/dL — ABNORMAL LOW (ref 13.0–17.0)
Immature Granulocytes: 0 %
Lymphocytes Relative: 41 %
Lymphs Abs: 0.9 10*3/uL (ref 0.7–4.0)
MCH: 27.8 pg (ref 26.0–34.0)
MCHC: 32.2 g/dL (ref 30.0–36.0)
MCV: 86.2 fL (ref 80.0–100.0)
Monocytes Absolute: 0.6 10*3/uL (ref 0.1–1.0)
Monocytes Relative: 24 %
Neutro Abs: 0.7 10*3/uL — ABNORMAL LOW (ref 1.7–7.7)
Neutrophils Relative %: 29 %
Platelet Count: 219 10*3/uL (ref 150–400)
RBC: 3.85 MIL/uL — ABNORMAL LOW (ref 4.22–5.81)
RDW: 17.2 % — ABNORMAL HIGH (ref 11.5–15.5)
WBC Count: 2.3 10*3/uL — ABNORMAL LOW (ref 4.0–10.5)
nRBC: 0 % (ref 0.0–0.2)

## 2020-07-22 LAB — CMP (CANCER CENTER ONLY)
ALT: 19 U/L (ref 0–44)
AST: 22 U/L (ref 15–41)
Albumin: 2.7 g/dL — ABNORMAL LOW (ref 3.5–5.0)
Alkaline Phosphatase: 100 U/L (ref 38–126)
Anion gap: 10 (ref 5–15)
BUN: 10 mg/dL (ref 6–20)
CO2: 24 mmol/L (ref 22–32)
Calcium: 8.6 mg/dL — ABNORMAL LOW (ref 8.9–10.3)
Chloride: 103 mmol/L (ref 98–111)
Creatinine: 0.94 mg/dL (ref 0.61–1.24)
GFR, Estimated: >60 mL/min (ref >60)
Glucose, Bld: 107 mg/dL — ABNORMAL HIGH (ref 70–99)
Potassium: 3.6 mmol/L (ref 3.5–5.1)
Sodium: 137 mmol/L (ref 135–145)
Total Bilirubin: 0.5 mg/dL (ref 0.3–1.2)
Total Protein: 6.8 g/dL (ref 6.5–8.1)

## 2020-07-22 MED ORDER — SODIUM CHLORIDE 0.9 % IV SOLN
Freq: Once | INTRAVENOUS | Status: AC
Start: 2020-07-22 — End: 2020-07-22
  Filled 2020-07-22: qty 250

## 2020-07-22 MED ORDER — SODIUM CHLORIDE 0.9 % IV SOLN
200.0000 mg | Freq: Once | INTRAVENOUS | Status: AC
  Administered 2020-07-22: 200 mg via INTRAVENOUS
  Filled 2020-07-22: qty 8

## 2020-07-22 NOTE — Patient Instructions (Signed)
Pembrolizumab injection What is this medicine? PEMBROLIZUMAB (pem broe liz ue mab) is a monoclonal antibody. It is used to treat certain types of cancer. This medicine may be used for other purposes; ask your health care provider or pharmacist if you have questions. COMMON BRAND NAME(S): Keytruda What should I tell my health care provider before I take this medicine? They need to know if you have any of these conditions:  autoimmune diseases like Crohn's disease, ulcerative colitis, or lupus  have had or planning to have an allogeneic stem cell transplant (uses someone else's stem cells)  history of organ transplant  history of chest radiation  nervous system problems like myasthenia gravis or Guillain-Barre syndrome  an unusual or allergic reaction to pembrolizumab, other medicines, foods, dyes, or preservatives  pregnant or trying to get pregnant  breast-feeding How should I use this medicine? This medicine is for infusion into a vein. It is given by a health care professional in a hospital or clinic setting. A special MedGuide will be given to you before each treatment. Be sure to read this information carefully each time. Talk to your pediatrician regarding the use of this medicine in children. While this drug may be prescribed for children as young as 6 months for selected conditions, precautions do apply. Overdosage: If you think you have taken too much of this medicine contact a poison control center or emergency room at once. NOTE: This medicine is only for you. Do not share this medicine with others. What if I miss a dose? It is important not to miss your dose. Call your doctor or health care professional if you are unable to keep an appointment. What may interact with this medicine? Interactions have not been studied. This list may not describe all possible interactions. Give your health care provider a list of all the medicines, herbs, non-prescription drugs, or dietary  supplements you use. Also tell them if you smoke, drink alcohol, or use illegal drugs. Some items may interact with your medicine. What should I watch for while using this medicine? Your condition will be monitored carefully while you are receiving this medicine. You may need blood work done while you are taking this medicine. Do not become pregnant while taking this medicine or for 4 months after stopping it. Women should inform their doctor if they wish to become pregnant or think they might be pregnant. There is a potential for serious side effects to an unborn child. Talk to your health care professional or pharmacist for more information. Do not breast-feed an infant while taking this medicine or for 4 months after the last dose. What side effects may I notice from receiving this medicine? Side effects that you should report to your doctor or health care professional as soon as possible:  allergic reactions like skin rash, itching or hives, swelling of the face, lips, or tongue  bloody or black, tarry  breathing problems  changes in vision  chest pain  chills  confusion  constipation  cough  diarrhea  dizziness or feeling faint or lightheaded  fast or irregular heartbeat  fever  flushing  joint pain  low blood counts - this medicine may decrease the number of white blood cells, red blood cells and platelets. You may be at increased risk for infections and bleeding.  muscle pain  muscle weakness  pain, tingling, numbness in the hands or feet  persistent headache  redness, blistering, peeling or loosening of the skin, including inside the mouth  signs and  symptoms of high blood sugar such as dizziness; dry mouth; dry skin; fruity breath; nausea; stomach pain; increased hunger or thirst; increased urination  signs and symptoms of kidney injury like trouble passing urine or change in the amount of urine  signs and symptoms of liver injury like dark urine,  light-colored stools, loss of appetite, nausea, right upper belly pain, yellowing of the eyes or skin  sweating  swollen lymph nodes  weight loss Side effects that usually do not require medical attention (report to your doctor or health care professional if they continue or are bothersome):  decreased appetite  hair loss  tiredness This list may not describe all possible side effects. Call your doctor for medical advice about side effects. You may report side effects to FDA at 1-800-FDA-1088. Where should I keep my medicine? This drug is given in a hospital or clinic and will not be stored at home. NOTE: This sheet is a summary. It may not cover all possible information. If you have questions about this medicine, talk to your doctor, pharmacist, or health care provider.  2021 Elsevier/Gold Standard (2019-01-03 21:44:53)

## 2020-07-24 NOTE — Telephone Encounter (Signed)
Can you please take a look at my husband latest lung scan. Dr. Earlie Server suggested taking him off of the [facility] for a while because he has ground glass opacity. Donnie and I decided that he should stay on it because his tumors are shrinking. He is also taking 200 mg of Amiodarone which can also cause the same side effect. I would appreciate your opinion. They were supposed to reach out to his cardiologist on Tuesday but as of yet we have not heard anything.   Thank you! [name]  Dr. [name], please advise on mychart message. Thanks!

## 2020-07-25 ENCOUNTER — Telehealth: Payer: Self-pay | Admitting: Medical Oncology

## 2020-07-25 ENCOUNTER — Encounter: Payer: Self-pay | Admitting: Internal Medicine

## 2020-07-25 DIAGNOSIS — I480 Paroxysmal atrial fibrillation: Secondary | ICD-10-CM

## 2020-07-25 DIAGNOSIS — R918 Other nonspecific abnormal finding of lung field: Secondary | ICD-10-CM

## 2020-07-25 NOTE — Telephone Encounter (Signed)
Cardiac Medication Management- Pt inquiring about the conversation between Oncology and cardiology regarding stopping a cardiac medication.  I will ask John [name] or John [name] to advise.

## 2020-07-28 ENCOUNTER — Telehealth: Payer: Self-pay | Admitting: Medical Oncology

## 2020-07-28 ENCOUNTER — Other Ambulatory Visit: Payer: 59

## 2020-07-28 ENCOUNTER — Other Ambulatory Visit: Payer: Self-pay

## 2020-07-28 DIAGNOSIS — R918 Other nonspecific abnormal finding of lung field: Secondary | ICD-10-CM

## 2020-07-28 DIAGNOSIS — I480 Paroxysmal atrial fibrillation: Secondary | ICD-10-CM

## 2020-07-28 LAB — SEDIMENTATION RATE: Sed Rate: 44 mm/hr — ABNORMAL HIGH (ref 0–30)

## 2020-07-28 NOTE — Telephone Encounter (Signed)
Had cardiac labs today per Fransico Him.

## 2020-07-30 ENCOUNTER — Inpatient Hospital Stay: Payer: 59 | Admitting: Dietician

## 2020-07-30 NOTE — Telephone Encounter (Signed)
Pt sent another mychart message after he never heard back from [place] last week after he sent his prior message. Dr. [name], please advise.

## 2020-07-30 NOTE — Progress Notes (Signed)
Nutrition Follow-up:  Patient with stage IIIb right lung cancer. He completed radiation therapy on 06/04/20. Patient currently receiving single agent immunotherapy with Keytruda.   Spoke with patient via telephone. He reports having a good appetite, says he eats all the time. Patient reports 3 meals and drinking Boost High Protein (250 kcal, 20 g protein) for mid morning and late afternoon snack. He reports tolerating oral nutrition supplements well. He is drinking 4 (16 oz) bottles of water and 2 G2 drinks/day. Patient reports maintaining weight per home scale, recalls 201 lb this morning. He is trying to be more active, says pain from RA has increased making activity challenging at times.   Medications: reviewed  Labs: reviewed  Anthropometrics: Weight 202 lb 12.8 oz on 6/7 decreased from 204 kv 3.2 oz on 5/23 and 208 lb 14.4 oz on 5/16   NUTRITION DIAGNOSIS: Unintentional weight loss ongoing   INTERVENTION:  Patient will add bedtime snack for added calories and protein Reviewed high calorie, high protein snack ideas - patient has handout Discussed ONS profiles, suggesting switching to Boost Plus/Ensure Enlive (350 kcal, 20 g protein) Recommended patient to drink 3 supplements/day Will mail Ensure coupons Patient has contact information    MONITORING, EVALUATION, GOAL: weight trends, intake   NEXT VISIT: via telephone Tuesday, July 12

## 2020-07-31 ENCOUNTER — Telehealth: Payer: Self-pay

## 2020-07-31 DIAGNOSIS — I480 Paroxysmal atrial fibrillation: Secondary | ICD-10-CM

## 2020-07-31 NOTE — Telephone Encounter (Signed)
Spoke with the patient and advised him that Dr. Radford Pax has referred him to the atrial fibrillation clinic. Patient verbalized understanding.

## 2020-07-31 NOTE — Telephone Encounter (Signed)
Yes please get him in a nodule slot

## 2020-07-31 NOTE — Telephone Encounter (Signed)
-----   Message from Sueanne Margarita, MD sent at 07/30/2020 12:24 PM EDT ----- Refer to afib clinic for recommenndations ----- Message ----- From: [name], RN Sent: 07/29/2020   5:31 PM EDT To: Sueanne Margarita, MD  The patient has been notified of the result and verbalized understanding.  All questions (if any) were answered. [name], RN 07/29/2020 5:30 PM   Patient sees Dr. [name] and will get in with him ASAP.  Patient states that he really does not want to be on amiodarone any longer and would like to know if he can discontinue.

## 2020-07-31 NOTE — Telephone Encounter (Signed)
Attempted to get patient scheduled for an appt with Dr. [name], but his next opening is not until August. RB, would you be ok with [place] using one of your lung nodule held spots for him?

## 2020-08-01 ENCOUNTER — Telehealth: Payer: Self-pay

## 2020-08-01 LAB — VITAMIN B6: Vitamin B6: 1.1 ug/L — ABNORMAL LOW (ref 3.4–65.2)

## 2020-08-01 NOTE — Telephone Encounter (Signed)
I spoke with pt and advised as indicated. Pt expressed understanding of this information. 

## 2020-08-01 NOTE — Telephone Encounter (Signed)
-----   Message from [place], PA-C sent at 08/01/2020 12:26 PM EDT ----- Can you ask him to start taking B6? That may becausing PN. Should be over the counter ----- Message ----- From: Interface, [facility] [place] Sent: 07/07/2020  10:02 AM EDT To: Tobe Sos [name], PA-C

## 2020-08-11 ENCOUNTER — Encounter: Payer: Self-pay | Admitting: Internal Medicine

## 2020-08-11 ENCOUNTER — Inpatient Hospital Stay (HOSPITAL_BASED_OUTPATIENT_CLINIC_OR_DEPARTMENT_OTHER): Payer: 59 | Admitting: Internal Medicine

## 2020-08-11 ENCOUNTER — Inpatient Hospital Stay: Payer: 59

## 2020-08-11 ENCOUNTER — Other Ambulatory Visit: Payer: Self-pay

## 2020-08-11 ENCOUNTER — Ambulatory Visit: Payer: 59 | Admitting: Emergency Medicine

## 2020-08-11 VITALS — BP 110/77 | HR 91 | Temp 97.6°F | Resp 18 | Ht 71.0 in | Wt 204.9 lb

## 2020-08-11 DIAGNOSIS — C3491 Malignant neoplasm of unspecified part of right bronchus or lung: Secondary | ICD-10-CM | POA: Diagnosis not present

## 2020-08-11 DIAGNOSIS — D702 Other drug-induced agranulocytosis: Secondary | ICD-10-CM

## 2020-08-11 DIAGNOSIS — Z5112 Encounter for antineoplastic immunotherapy: Secondary | ICD-10-CM | POA: Diagnosis not present

## 2020-08-11 HISTORY — DX: Other drug-induced agranulocytosis: D70.2

## 2020-08-11 LAB — CMP (CANCER CENTER ONLY)
ALT: 25 U/L (ref 0–44)
AST: 27 U/L (ref 15–41)
Albumin: 3 g/dL — ABNORMAL LOW (ref 3.5–5.0)
Alkaline Phosphatase: 101 U/L (ref 38–126)
Anion gap: 9 (ref 5–15)
BUN: 10 mg/dL (ref 6–20)
CO2: 25 mmol/L (ref 22–32)
Calcium: 8.9 mg/dL (ref 8.9–10.3)
Chloride: 104 mmol/L (ref 98–111)
Creatinine: 0.85 mg/dL (ref 0.61–1.24)
GFR, Estimated: >60 mL/min (ref >60)
Glucose, Bld: 96 mg/dL (ref 70–99)
Potassium: 4.1 mmol/L (ref 3.5–5.1)
Sodium: 138 mmol/L (ref 135–145)
Total Bilirubin: 0.6 mg/dL (ref 0.3–1.2)
Total Protein: 7.2 g/dL (ref 6.5–8.1)

## 2020-08-11 LAB — CBC WITH DIFFERENTIAL (CANCER CENTER ONLY)
Abs Immature Granulocytes: 0 10*3/uL (ref 0.00–0.07)
Basophils Absolute: 0 10*3/uL (ref 0.0–0.1)
Basophils Relative: 1 %
Eosinophils Absolute: 0.1 10*3/uL (ref 0.0–0.5)
Eosinophils Relative: 5 %
HCT: 36.5 % — ABNORMAL LOW (ref 39.0–52.0)
Hemoglobin: 11.9 g/dL — ABNORMAL LOW (ref 13.0–17.0)
Immature Granulocytes: 0 %
Lymphocytes Relative: 52 %
Lymphs Abs: 1.1 10*3/uL (ref 0.7–4.0)
MCH: 28 pg (ref 26.0–34.0)
MCHC: 32.6 g/dL (ref 30.0–36.0)
MCV: 85.9 fL (ref 80.0–100.0)
Monocytes Absolute: 0.6 10*3/uL (ref 0.1–1.0)
Monocytes Relative: 26 %
Neutro Abs: 0.3 10*3/uL — CL (ref 1.7–7.7)
Neutrophils Relative %: 16 %
Platelet Count: 217 10*3/uL (ref 150–400)
RBC: 4.25 MIL/uL (ref 4.22–5.81)
RDW: 16.7 % — ABNORMAL HIGH (ref 11.5–15.5)
WBC Count: 2.1 10*3/uL — ABNORMAL LOW (ref 4.0–10.5)
nRBC: 0 % (ref 0.0–0.2)

## 2020-08-11 LAB — TSH: TSH: 2.932 u[IU]/mL (ref 0.320–4.118)

## 2020-08-11 NOTE — Progress Notes (Unsigned)
CRITICAL VALUE STICKER  CRITICAL VALUE: ANC 0.3  RECEIVER (on-site recipient of call): Katalyna Socarras  DATE & TIME NOTIFIED: 08/11/2020. [id number] /\MESSENGER (representative from lab):  MD NOTIFIED: Dr. [name]  TIME OF NOTIFICATION: 0906  RESPONSE: no orders given

## 2020-08-11 NOTE — Progress Notes (Signed)
Pt refused to take  G-csf so his tx was cancelled today per Dr [name]. Schedule message sent to r/s for next week.

## 2020-08-11 NOTE — Progress Notes (Signed)
Pt declined zarxio injection and ANC is 0.3, no treatment today per [name], MD. Pt sent to scheduling, discharged in stable condition.

## 2020-08-11 NOTE — Progress Notes (Signed)
Per Dr [name] , it is ok to treat pt today with Keytruda and ANC of 0.3. Granix ordered x 2 days per Dr [name].

## 2020-08-11 NOTE — Progress Notes (Signed)
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 He was diagnosed in February 2022.  He also has rheumatoid arthritis.   PDL1: 70%   PRIOR THERAPY: Palliative radiotherapy to the large right upper lobe lung mass and mediastinal lymphadenopathy under the care of Dr. [name]  CURRENT THERAPY: Systemic chemotherapy with carboplatin for AUC of 5, paclitaxel 175 mg/M2 and Keytruda 200 mg IV every 3 weeks with Neulasta support.  First dose May 05, 2020.  Status post 4 cycles. Starting from cycle #2, the patient will be on single agent immunotherapy with Keytruda due to neutropenia with chemotherapy.   INTERVAL HISTORY: John [name] 60 y.o. male returns to the clinic today for follow-up visit accompanied by his wife.  The patient is feeling fine today with no concerning complaints.  He denied having any current chest pain, shortness of breath but has mild cough with no hemoptysis.  He denied having any fever or chills.  He has no nausea, vomiting, diarrhea or constipation.  He denied having any headache or visual changes.  He did reach out to his cardiologist Dr. Radford Pax regarding amiodarone and if there is any other alternative medication and she offered him a second opinion for his condition.  The patient is here today for evaluation before starting cycle #5 of his treatment.  MEDICAL HISTORY: Past Medical History:  Diagnosis Date   Dyspnea    Family history of adverse reaction to anesthesia    brother with seizures had episode under anesthesia.   Patient has seizures as well.   GERD (gastroesophageal reflux disease)    History of radiation therapy 04/24/2020-05/16/2020   IMRT to right lung     Dr [name]   PAF (paroxysmal atrial fibrillation) ([place])    CHADS2VSAC score 0   Rheumatoid aortitis    Squamous cell lung cancer ([place])     ALLERGIES:  has No Known Allergies.  MEDICATIONS:  Current Outpatient Medications  Medication Sig Dispense Refill   acetaminophen (TYLENOL) 500 MG tablet Take 500 mg by mouth every 6 (six) hours as needed for mild pain or moderate pain.     amiodarone (PACERONE) 200 MG tablet Take 1 tablet (200 mg total) by mouth daily. 90 tablet 3   CARBOplatin 1000 MG/100ML SOLN Inject into the vein.     filgrastim-sndz (ZARXIO) 480 MCG/0.8ML SOSY injection Injection 0.8 ml daily for 3 days (Patient not taking: No sig reported) 2.4 mL 0   gabapentin (NEURONTIN) 100 MG capsule Take 1 capsule (100 mg total) by mouth at bedtime. (Patient not taking: No sig reported) 30 capsule 0   leflunomide (ARAVA) 20 MG tablet Take 1 tablet (20 mg total) by mouth at bedtime. MAY RESUME AFTER COMPLETING TREATMENT FOR C-DIFF (Patient not taking: No sig reported)     levalbuterol (XOPENEX HFA) 45 MCG/ACT inhaler Inhale 2 puffs into the lungs every 4 (four) hours as needed for wheezing. 1 each 12   metoprolol tartrate (LOPRESSOR) 25 MG tablet Take 1 tablet (25 mg total) by mouth 2 (two) times daily. 180 tablet 3   PACLitaxel  100 MG/16.67ML CONC Inject into the vein.     PARoxetine (PAXIL) 10 MG tablet Take 10 mg by mouth daily.     pegfilgrastim-bmez (ZIEXTENZO) 6 MG/0.6ML injection Inject 0.6 mL into the skin 2 days after chemotherapy (Patient not taking: No sig reported) 0.6 mL 3   PRILOSEC OTC 20 MG tablet Take 20 mg by mouth at bedtime.     prochlorperazine (COMPAZINE) 10 MG tablet Take 1 tablet (10 mg total) by mouth every 6 (six) hours as needed for nausea or vomiting. (Patient not taking: No sig reported) 30 tablet 0   SIMPONI 50  MG/0.5ML SOAJ Inject 50 mg into the skin every 30 (thirty) days. MAY RESUME AFTER COMPLETING TREATMENT FOR C-DIFF (Patient not taking: No sig reported) 0.3 mL    sodium chloride 0.9 % SOLN 50 mL with pembrolizumab 100 MG/4ML SOLN 2 mg/kg Inject 2 mg/kg into the vein. (Patient not taking: No sig reported)     No current facility-administered medications for this visit.    SURGICAL HISTORY:  Past Surgical History:  Procedure Laterality Date   BRONCHIAL BRUSHINGS  03/27/2020   Procedure: BRONCHIAL BRUSHINGS;  Surgeon: [name], MD;  Location: [facility];  Service: Cardiopulmonary;;   CATARACT EXTRACTION  2016   at [place]  03/27/2020   Procedure: FINE NEEDLE ASPIRATION;  Surgeon: [name], MD;  Location: [facility];  Service: Cardiopulmonary;;   HIP SURGERY Left    VIDEO BRONCHOSCOPY WITH ENDOBRONCHIAL ULTRASOUND N/A 03/27/2020   Procedure: VIDEO BRONCHOSCOPY WITH ENDOBRONCHIAL ULTRASOUND;  Surgeon: [name], MD;  Location: [facility];  Service: Cardiopulmonary;  Laterality: N/A;    REVIEW OF SYSTEMS:  Constitutional: negative Eyes: negative Ears, nose, mouth, throat, and face: negative Respiratory: positive for cough Cardiovascular: negative Gastrointestinal: negative Genitourinary:negative Integument/breast: negative Hematologic/lymphatic: negative Musculoskeletal:negative Neurological: negative Behavioral/Psych: negative Endocrine: negative Allergic/Immunologic: negative   PHYSICAL EXAMINATION: General appearance: alert, cooperative, and no distress Head: Normocephalic, without obvious abnormality, atraumatic Neck: no adenopathy, no JVD, supple, symmetrical, trachea midline, and thyroid not enlarged, symmetric, no tenderness/mass/nodules Lymph nodes: Cervical, supraclavicular, and axillary nodes normal. Resp: clear to auscultation bilaterally Back: symmetric, no curvature. ROM normal. No CVA tenderness. Cardio: regular rate and  rhythm, S1, S2 normal, no murmur, click, rub or gallop GI: soft, non-tender; bowel sounds normal; no masses,  no organomegaly Extremities: extremities normal, atraumatic, no cyanosis or edema Neurologic: Alert and oriented X 3, normal strength and tone. Normal symmetric reflexes. Normal coordination and gait  ECOG PERFORMANCE STATUS: 1 - Symptomatic but completely ambulatory  Blood pressure 110/77, pulse 91, temperature 97.6 F (36.4 C), temperature source Tympanic, resp. rate 18, height '5\' 11"'  (1.803 m), weight 204 lb 14.4 oz (92.9 kg), SpO2 95 %.  LABORATORY DATA: Lab Results  Component Value Date   WBC 2.3 (L) 07/22/2020   HGB 10.7 (L) 07/22/2020   HCT 33.2 (L) 07/22/2020   MCV 86.2 07/22/2020   PLT 219 07/22/2020      Chemistry      Component Value Date/Time   NA 137 07/22/2020 1143   K 3.6 07/22/2020 1143   CL 103 07/22/2020 1143   CO2 24 07/22/2020 1143   BUN 10 07/22/2020 1143   CREATININE 0.94 07/22/2020 1143      Component Value Date/Time   CALCIUM 8.6 (L) 07/22/2020 1143   ALKPHOS 100 07/22/2020 1143   AST 22 07/22/2020 1143   ALT 19 07/22/2020 1143   BILITOT 0.5 07/22/2020 1143  RADIOGRAPHIC STUDIES: CT Chest W Contrast  Result Date: 07/18/2020 CLINICAL DATA:  Primary Cancer Type: Lung Imaging Indication: Assess response to therapy Interval therapy since last imaging? Yes Initial Cancer Diagnosis Date: 03/27/2020; Established by: Biopsy-proven Detailed Pathology: Stage IIIb/IV non-small cell lung cancer, squamous cell carcinoma. Primary Tumor location: Right upper lobe.  Right adrenal mass. Surgeries: No. Chemotherapy: Yes; Ongoing? No; Most recent administration: 05/05/2020 Immunotherapy?  Yes; Type: Keytruda; Ongoing? No Radiation therapy? Yes; Date Range: 04/25/2020 - 05/16/2020; Target: Right lung EXAM: CT CHEST, ABDOMEN, AND PELVIS WITH CONTRAST TECHNIQUE: Multidetector CT imaging of the chest, abdomen and pelvis was performed following the standard  protocol during bolus administration of intravenous contrast. CONTRAST:  12m OMNIPAQUE IOHEXOL 300 MG/ML  SOLN COMPARISON:  Most recent CT chest 05/12/2020.  04/29/2020 PET-CT. FINDINGS: CT CHEST FINDINGS Cardiovascular: Normal heart size. No significant pericardial effusion/thickening. Left anterior descending and left circumflex coronary atherosclerosis. Atherosclerotic nonaneurysmal thoracic aorta. Normal caliber pulmonary arteries. No central pulmonary emboli. Mediastinum/Nodes: No discrete thyroid nodules. Unremarkable esophagus. No axillary adenopathy. Stable mildly enlarged 1.2 cm right hilar node (series 2/image 24). No pathologically enlarged mediastinal or left hilar nodes. Lungs/Pleura: No pneumothorax. No pleural effusion. Mild centrilobular and paraseptal emphysema with diffuse bronchial wall thickening. Solid peripheral right upper lobe 1.1 cm pulmonary nodule (series 6/image 34), previously 1.1 cm using similar measurement technique, stable. New large region of fairly sharply marginated patchy consolidation and ground-glass opacity throughout the perihilar right mid lung, compatible with early postradiation change, encompassing cavitary posterior right upper lobe lung mass, which measures 5.0 x 3.2 cm (series 6/image 57), decreased from 7.1 x 4.5 cm, and which is increasingly cavitary. No new significant pulmonary nodules. New mild to moderate patchy subpleural reticulation and ground-glass opacity throughout both lungs. Musculoskeletal: No aggressive appearing focal osseous lesions. Mild thoracic spondylosis. CT ABDOMEN PELVIS FINDINGS Hepatobiliary: Normal liver with no liver mass. Cholelithiasis. No biliary ductal dilatation. Pancreas: Normal, with no mass or duct dilation. Spleen: Mild splenomegaly. Craniocaudal splenic length 15.6 cm, previously 15.2 cm on PET-CT, similar. Several wedge-shaped subcapsular low-attenuation foci throughout the periphery of the spleen, not apparent on previous  noncontrast studies, favor splenic infarcts. No discrete splenic masses. Adrenals/Urinary Tract: Solid right adrenal 4.5 cm mass with density 33 HU, decreased from 6.8 cm on 05/12/2020 CT. No left adrenal nodules. Subcentimeter hypodense posterior interpolar left renal cortical lesion is too small to characterize and requires no follow-up. Otherwise normal kidneys, with no hydronephrosis. Normal bladder. Stomach/Bowel: Normal non-distended stomach. Normal caliber small bowel with no small bowel wall thickening. Normal appendix. Oral contrast transits to the rectum. Normal large bowel with no diverticulosis, large bowel wall thickening or pericolonic fat stranding. Vascular/Lymphatic: Atherosclerotic abdominal aorta with mildly dilated 2.8 cm infrarenal abdominal aorta. Patent portal, splenic, hepatic and renal veins. No pathologically enlarged lymph nodes in the abdomen or pelvis. Reproductive: Normal size prostate. Other: No pneumoperitoneum, ascites or focal fluid collection. Haziness of the central mesenteric fat is mildly increased, without associated adenopathy or masses. Musculoskeletal: No aggressive appearing focal osseous lesions. Chronic mild L2 vertebral compression deformity. Mild lumbar spondylosis. IMPRESSION: 1. Interval positive response to therapy. Cavitary posterior right upper lobe mass is decreased in size and is increasingly cavitary. Solid peripheral right upper lobe 1.1 cm pulmonary nodule, stable. Stable mild right hilar adenopathy. Right adrenal metastasis is decreased. No new or progressive metastatic disease. 2. New mild to moderate patchy subpleural reticulation and ground-glass opacity throughout both lungs, nonspecific, consider pulmonary drug toxicity. 3. New prominent early  postradiation change in the right mid lung surrounding the lung mass. 4. Mild splenomegaly, stable. Several subcapsular splenic infarcts, not apparent on previous noncontrast study. 5. Increased haziness of the  central mesenteric fat without associated adenopathy or masses, nonspecific, favor nonspecific inflammatory or noninflammatory edema. 6. Aortic Atherosclerosis (ICD10-I70.0). Electronically Signed   By: Ilona Sorrel M.D.   On: 07/18/2020 11:05   CT Abdomen Pelvis W Contrast  Result Date: 07/18/2020 CLINICAL DATA:  Primary Cancer Type: Lung Imaging Indication: Assess response to therapy Interval therapy since last imaging? Yes Initial Cancer Diagnosis Date: 03/27/2020; Established by: Biopsy-proven Detailed Pathology: Stage IIIb/IV non-small cell lung cancer, squamous cell carcinoma. Primary Tumor location: Right upper lobe.  Right adrenal mass. Surgeries: No. Chemotherapy: Yes; Ongoing? No; Most recent administration: 05/05/2020 Immunotherapy?  Yes; Type: Keytruda; Ongoing? No Radiation therapy? Yes; Date Range: 04/25/2020 - 05/16/2020; Target: Right lung EXAM: CT CHEST, ABDOMEN, AND PELVIS WITH CONTRAST TECHNIQUE: Multidetector CT imaging of the chest, abdomen and pelvis was performed following the standard protocol during bolus administration of intravenous contrast. CONTRAST:  137m OMNIPAQUE IOHEXOL 300 MG/ML  SOLN COMPARISON:  Most recent CT chest 05/12/2020.  04/29/2020 PET-CT. FINDINGS: CT CHEST FINDINGS Cardiovascular: Normal heart size. No significant pericardial effusion/thickening. Left anterior descending and left circumflex coronary atherosclerosis. Atherosclerotic nonaneurysmal thoracic aorta. Normal caliber pulmonary arteries. No central pulmonary emboli. Mediastinum/Nodes: No discrete thyroid nodules. Unremarkable esophagus. No axillary adenopathy. Stable mildly enlarged 1.2 cm right hilar node (series 2/image 24). No pathologically enlarged mediastinal or left hilar nodes. Lungs/Pleura: No pneumothorax. No pleural effusion. Mild centrilobular and paraseptal emphysema with diffuse bronchial wall thickening. Solid peripheral right upper lobe 1.1 cm pulmonary nodule (series 6/image 34), previously  1.1 cm using similar measurement technique, stable. New large region of fairly sharply marginated patchy consolidation and ground-glass opacity throughout the perihilar right mid lung, compatible with early postradiation change, encompassing cavitary posterior right upper lobe lung mass, which measures 5.0 x 3.2 cm (series 6/image 57), decreased from 7.1 x 4.5 cm, and which is increasingly cavitary. No new significant pulmonary nodules. New mild to moderate patchy subpleural reticulation and ground-glass opacity throughout both lungs. Musculoskeletal: No aggressive appearing focal osseous lesions. Mild thoracic spondylosis. CT ABDOMEN PELVIS FINDINGS Hepatobiliary: Normal liver with no liver mass. Cholelithiasis. No biliary ductal dilatation. Pancreas: Normal, with no mass or duct dilation. Spleen: Mild splenomegaly. Craniocaudal splenic length 15.6 cm, previously 15.2 cm on PET-CT, similar. Several wedge-shaped subcapsular low-attenuation foci throughout the periphery of the spleen, not apparent on previous noncontrast studies, favor splenic infarcts. No discrete splenic masses. Adrenals/Urinary Tract: Solid right adrenal 4.5 cm mass with density 33 HU, decreased from 6.8 cm on 05/12/2020 CT. No left adrenal nodules. Subcentimeter hypodense posterior interpolar left renal cortical lesion is too small to characterize and requires no follow-up. Otherwise normal kidneys, with no hydronephrosis. Normal bladder. Stomach/Bowel: Normal non-distended stomach. Normal caliber small bowel with no small bowel wall thickening. Normal appendix. Oral contrast transits to the rectum. Normal large bowel with no diverticulosis, large bowel wall thickening or pericolonic fat stranding. Vascular/Lymphatic: Atherosclerotic abdominal aorta with mildly dilated 2.8 cm infrarenal abdominal aorta. Patent portal, splenic, hepatic and renal veins. No pathologically enlarged lymph nodes in the abdomen or pelvis. Reproductive: Normal size  prostate. Other: No pneumoperitoneum, ascites or focal fluid collection. Haziness of the central mesenteric fat is mildly increased, without associated adenopathy or masses. Musculoskeletal: No aggressive appearing focal osseous lesions. Chronic mild L2 vertebral compression deformity. Mild lumbar spondylosis. IMPRESSION: 1. Interval positive response  to therapy. Cavitary posterior right upper lobe mass is decreased in size and is increasingly cavitary. Solid peripheral right upper lobe 1.1 cm pulmonary nodule, stable. Stable mild right hilar adenopathy. Right adrenal metastasis is decreased. No new or progressive metastatic disease. 2. New mild to moderate patchy subpleural reticulation and ground-glass opacity throughout both lungs, nonspecific, consider pulmonary drug toxicity. 3. New prominent early postradiation change in the right mid lung surrounding the lung mass. 4. Mild splenomegaly, stable. Several subcapsular splenic infarcts, not apparent on previous noncontrast study. 5. Increased haziness of the central mesenteric fat without associated adenopathy or masses, nonspecific, favor nonspecific inflammatory or noninflammatory edema. 6. Aortic Atherosclerosis (ICD10-I70.0). Electronically Signed   By: Ilona Sorrel M.D.   On: 07/18/2020 11:05    ASSESSMENT AND PLAN: This is a very pleasant 60 year old white male recently diagnosed with stage IV (T4, N2, M0/M1 C) non-small cell lung cancer, squamous cell carcinoma. 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 Diagnosed in February 2022. His PDL1 expression is 70% The patient completed palliative radiotherapy to the large right upper lobe lung mass and mediastinal lymphadenopathy. The patient started systemic chemotherapy with carboplatin for AUC of 5,  paclitaxel 175 mg/M2 and Keytruda 200 mg IV every 3 weeks status post 4 cycles.  He had a rough time with the first cycle of his treatment with admission with CHF, C. Diff, and atrial fibrillation.  He also had prolonged neutropenia. Starting with cycle #2, I I recommended for the patient to discontinue chemotherapy for now due to his intolerance with fatigue/weakness and prolonged neutropenia. He is a good candidate for single agent immunotherapy with Keytruda due to his PDL1 expression of 70% which will not cause significant myelosuppression. Of course, if the patient's blood work in the future improves, we can always revisit adding back chemotherapy or reduced dose chemotherapy.   The patient continues to tolerate his treatment with single agent Keytruda fairly well. He will proceed with cycle #5 today as planned. Regarding the drug-induced neutropenia, will arrange for the patient to receive Granix 480 mcg subcutaneously for 2 days. I will see the patient back for follow-up visit in 3 weeks for evaluation before starting cycle #6. The patient was advised to call immediately if he has any other concerning symptoms in the interval. The patient voices understanding of current disease status and treatment options and is in agreement with the current care plan.  All questions were answered. The patient knows to call the clinic with any problems, questions or concerns. We can certainly see the patient much sooner if necessary. The total time spent in the appointment was 30 minutes.  Disclaimer: This note was dictated with voice recognition software. Similar sounding words can inadvertently be transcribed and may not be corrected upon review.

## 2020-08-12 ENCOUNTER — Ambulatory Visit: Payer: 59 | Admitting: Emergency Medicine

## 2020-08-12 ENCOUNTER — Encounter: Payer: Self-pay | Admitting: Internal Medicine

## 2020-08-12 NOTE — Telephone Encounter (Signed)
Discussed with Dr. [name] and Shauna Hugh, RN. Pt was advised he will be treated yesterday, however his [place] later came back as 0.3. Dr. [name] was willing to still treat the pt with Granix support x2 days but pt did not want the injections, therefore the pt could not received tx. Pt instead opted to have his labs rechecked next week to determine if his number has recovered.  I spoke with pts wife and advised as indicated and she still feels this wasn't right as pts numbers are always low and "it shouldn't matter if he gets sick since he's terminal anyway". I reiterated to pts wife that it was felt unsafe to treat and will not treat without his doctors clearance to do so.

## 2020-08-20 ENCOUNTER — Other Ambulatory Visit: Payer: Self-pay

## 2020-08-20 ENCOUNTER — Inpatient Hospital Stay: Payer: 59

## 2020-08-20 ENCOUNTER — Inpatient Hospital Stay: Payer: 59 | Attending: Physician Assistant

## 2020-08-20 ENCOUNTER — Other Ambulatory Visit: Payer: Self-pay | Admitting: Internal Medicine

## 2020-08-20 VITALS — BP 113/73 | HR 70 | Temp 98.1°F

## 2020-08-20 DIAGNOSIS — Z5112 Encounter for antineoplastic immunotherapy: Secondary | ICD-10-CM | POA: Insufficient documentation

## 2020-08-20 DIAGNOSIS — Z79899 Other long term (current) drug therapy: Secondary | ICD-10-CM | POA: Diagnosis not present

## 2020-08-20 DIAGNOSIS — M069 Rheumatoid arthritis, unspecified: Secondary | ICD-10-CM | POA: Insufficient documentation

## 2020-08-20 DIAGNOSIS — C7971 Secondary malignant neoplasm of right adrenal gland: Secondary | ICD-10-CM | POA: Insufficient documentation

## 2020-08-20 DIAGNOSIS — I480 Paroxysmal atrial fibrillation: Secondary | ICD-10-CM | POA: Diagnosis not present

## 2020-08-20 DIAGNOSIS — C3411 Malignant neoplasm of upper lobe, right bronchus or lung: Secondary | ICD-10-CM | POA: Insufficient documentation

## 2020-08-20 DIAGNOSIS — Z923 Personal history of irradiation: Secondary | ICD-10-CM | POA: Diagnosis not present

## 2020-08-20 DIAGNOSIS — C3491 Malignant neoplasm of unspecified part of right bronchus or lung: Secondary | ICD-10-CM

## 2020-08-20 LAB — CBC WITH DIFFERENTIAL (CANCER CENTER ONLY)
Abs Immature Granulocytes: 0 10*3/uL (ref 0.00–0.07)
Basophils Absolute: 0 10*3/uL (ref 0.0–0.1)
Basophils Relative: 1 %
Eosinophils Absolute: 0 10*3/uL (ref 0.0–0.5)
Eosinophils Relative: 1 %
HCT: 35 % — ABNORMAL LOW (ref 39.0–52.0)
Hemoglobin: 11.4 g/dL — ABNORMAL LOW (ref 13.0–17.0)
Lymphocytes Relative: 51 %
Lymphs Abs: 1.1 10*3/uL (ref 0.7–4.0)
MCH: 28.5 pg (ref 26.0–34.0)
MCHC: 32.6 g/dL (ref 30.0–36.0)
MCV: 87.5 fL (ref 80.0–100.0)
Monocytes Absolute: 0.5 10*3/uL (ref 0.1–1.0)
Monocytes Relative: 23 %
Neutro Abs: 0.5 10*3/uL — ABNORMAL LOW (ref 1.7–7.7)
Neutrophils Relative %: 24 %
Platelet Count: 202 10*3/uL (ref 150–400)
RBC: 4 MIL/uL — ABNORMAL LOW (ref 4.22–5.81)
RDW: 16.9 % — ABNORMAL HIGH (ref 11.5–15.5)
WBC Count: 2.2 10*3/uL — ABNORMAL LOW (ref 4.0–10.5)
nRBC: 0 % (ref 0.0–0.2)

## 2020-08-20 LAB — CMP (CANCER CENTER ONLY)
ALT: 23 U/L (ref 0–44)
AST: 29 U/L (ref 15–41)
Albumin: 3 g/dL — ABNORMAL LOW (ref 3.5–5.0)
Alkaline Phosphatase: 92 U/L (ref 38–126)
Anion gap: 8 (ref 5–15)
BUN: 8 mg/dL (ref 6–20)
CO2: 25 mmol/L (ref 22–32)
Calcium: 8.4 mg/dL — ABNORMAL LOW (ref 8.9–10.3)
Chloride: 104 mmol/L (ref 98–111)
Creatinine: 0.9 mg/dL (ref 0.61–1.24)
GFR, Estimated: >60 mL/min (ref >60)
Glucose, Bld: 99 mg/dL (ref 70–99)
Potassium: 3.4 mmol/L — ABNORMAL LOW (ref 3.5–5.1)
Sodium: 137 mmol/L (ref 135–145)
Total Bilirubin: 0.7 mg/dL (ref 0.3–1.2)
Total Protein: 7 g/dL (ref 6.5–8.1)

## 2020-08-20 MED ORDER — SODIUM CHLORIDE 0.9 % IV SOLN
Freq: Once | INTRAVENOUS | Status: AC
  Filled 2020-08-20: qty 250

## 2020-08-20 MED ORDER — SODIUM CHLORIDE 0.9 % IV SOLN
200.0000 mg | Freq: Once | INTRAVENOUS | Status: AC
  Administered 2020-08-20: 200 mg via INTRAVENOUS
  Filled 2020-08-20: qty 8

## 2020-08-20 NOTE — Progress Notes (Signed)
Okay to proceed with tx, anc 0.5 provider made aware. Patient informed to go to ED or call clinic asap if he develops fever or chills, he states understanding.

## 2020-08-20 NOTE — Patient Instructions (Signed)
Pembrolizumab injection What is this medication? PEMBROLIZUMAB (pem broe liz ue mab) is a monoclonal antibody. It is used totreat certain types of cancer. This medicine may be used for other purposes; ask your health care provider orpharmacist if you have questions. COMMON BRAND NAME(S): Keytruda What should I tell my care team before I take this medication? They need to know if you have any of these conditions: autoimmune diseases like Crohn's disease, ulcerative colitis, or lupus have had or planning to have an allogeneic stem cell transplant (uses someone else's stem cells) history of organ transplant history of chest radiation nervous system problems like myasthenia gravis or Guillain-Barre syndrome an unusual or allergic reaction to pembrolizumab, other medicines, foods, dyes, or preservatives pregnant or trying to get pregnant breast-feeding How should I use this medication? This medicine is for infusion into a vein. It is given by a health careprofessional in a hospital or clinic setting. A special MedGuide will be given to you before each treatment. Be sure to readthis information carefully each time. Talk to your pediatrician regarding the use of this medicine in children. While this drug may be prescribed for children as young as 6 months for selectedconditions, precautions do apply. Overdosage: If you think you have taken too much of this medicine contact apoison control center or emergency room at once. NOTE: This medicine is only for you. Do not share this medicine with others. What if I miss a dose? It is important not to miss your dose. Call your doctor or health careprofessional if you are unable to keep an appointment. What may interact with this medication? Interactions have not been studied. This list may not describe all possible interactions. Give your health care provider a list of all the medicines, herbs, non-prescription drugs, or dietary supplements you use. Also  tell them if you smoke, drink alcohol, or use illegaldrugs. Some items may interact with your medicine. What should I watch for while using this medication? Your condition will be monitored carefully while you are receiving thismedicine. You may need blood work done while you are taking this medicine. Do not become pregnant while taking this medicine or for 4 months after stopping it. Women should inform their doctor if they wish to become pregnant or think they might be pregnant. There is a potential for serious side effects to an unborn child. Talk to your health care professional or pharmacist for more information. Do not breast-feed an infant while taking this medicine orfor 4 months after the last dose. What side effects may I notice from receiving this medication? Side effects that you should report to your doctor or health care professionalas soon as possible: allergic reactions like skin rash, itching or hives, swelling of the face, lips, or tongue bloody or black, tarry breathing problems changes in vision chest pain chills confusion constipation cough diarrhea dizziness or feeling faint or lightheaded fast or irregular heartbeat fever flushing joint pain low blood counts - this medicine may decrease the number of white blood cells, red blood cells and platelets. You may be at increased risk for infections and bleeding. muscle pain muscle weakness pain, tingling, numbness in the hands or feet persistent headache redness, blistering, peeling or loosening of the skin, including inside the mouth signs and symptoms of high blood sugar such as dizziness; dry mouth; dry skin; fruity breath; nausea; stomach pain; increased hunger or thirst; increased urination signs and symptoms of kidney injury like trouble passing urine or change in the amount of urine signs and  symptoms of liver injury like dark urine, light-colored stools, loss of appetite, nausea, right upper belly pain,  yellowing of the eyes or skin sweating swollen lymph nodes weight loss Side effects that usually do not require medical attention (report to yourdoctor or health care professional if they continue or are bothersome): decreased appetite hair loss tiredness This list may not describe all possible side effects. Call your doctor for medical advice about side effects. You may report side effects to FDA at1-800-FDA-1088. Where should I keep my medication? This drug is given in a hospital or clinic and will not be stored at home. NOTE: This sheet is a summary. It may not cover all possible information. If you have questions about this medicine, talk to your doctor, pharmacist, orhealth care provider.  2022 Elsevier/Gold Standard (2019-01-03 21:44:53)

## 2020-08-21 ENCOUNTER — Telehealth: Payer: Self-pay | Admitting: Internal Medicine

## 2020-08-21 NOTE — Telephone Encounter (Signed)
Scheduled appts per 7/6 sch msg. Pt aware.

## 2020-08-25 ENCOUNTER — Other Ambulatory Visit: Payer: Self-pay

## 2020-08-25 ENCOUNTER — Ambulatory Visit (HOSPITAL_COMMUNITY)
Admission: RE | Admit: 2020-08-25 | Discharge: 2020-08-25 | Disposition: A | Discharge status: Home / Self Care | Payer: 59 | Source: Ambulatory Visit | Attending: Physician Assistant | Admitting: Physician Assistant

## 2020-08-25 ENCOUNTER — Encounter (HOSPITAL_COMMUNITY): Payer: Self-pay | Admitting: Physician Assistant

## 2020-08-25 VITALS — BP 104/70 | HR 73 | Ht 71.0 in | Wt 204.0 lb

## 2020-08-25 DIAGNOSIS — C3490 Malignant neoplasm of unspecified part of unspecified bronchus or lung: Secondary | ICD-10-CM | POA: Diagnosis not present

## 2020-08-25 DIAGNOSIS — Z8249 Family history of ischemic heart disease and other diseases of the circulatory system: Secondary | ICD-10-CM | POA: Insufficient documentation

## 2020-08-25 DIAGNOSIS — I480 Paroxysmal atrial fibrillation: Secondary | ICD-10-CM | POA: Insufficient documentation

## 2020-08-25 DIAGNOSIS — Z87891 Personal history of nicotine dependence: Secondary | ICD-10-CM | POA: Diagnosis not present

## 2020-08-25 DIAGNOSIS — I4819 Other persistent atrial fibrillation: Secondary | ICD-10-CM

## 2020-08-25 DIAGNOSIS — Z79899 Other long term (current) drug therapy: Secondary | ICD-10-CM | POA: Diagnosis not present

## 2020-08-25 NOTE — Progress Notes (Signed)
Primary Care Physician: [name], MD Primary Cardiologist: Dr Radford Pax Primary Electrophysiologist: none Referring Physician: Dr Lenny Pastel John [name] is a 60 y.o. male with a history of stage IIIb/IV lung cancer, RA, and atrial fibrillation who presents for consultation in the [facility].  The patient was initially diagnosed with atrial fibrillation 04/2020 after presenting to the ED with generalized weakness. He had been started on systemic chemotherapy recently PTA.  He was noted to be tachycardic in rapid atrial fibrillation.  He was hospitalized for further management of acute C diff diarrhea, sepsis and febrile neutropenia. He was started on amiodarone which converted him to SR. Anticoagulation was not started given elevated bleeding risk with tumor pressing on pulmonary artery. Patient has a CHADS2VASC score of 0. Patient has not had any recurrence of heart racing and feels "great". There was concern about lung toxicity with either amiodarone or Keytruda on CT 07/18/20.  Today, he denies symptoms of palpitations, chest pain, orthopnea, PND, lower extremity edema, dizziness, presyncope, syncope, snoring, daytime somnolence, bleeding, or neurologic sequela. The patient is tolerating medications without difficulties and is otherwise without complaint today.    Atrial Fibrillation Risk Factors:  he does not have symptoms or diagnosis of sleep apnea. he does not have a history of rheumatic fever. The patient does have a history of early familial atrial fibrillation or other arrhythmias. Mother has afib.  he has a BMI of Body mass index is 28.45 kg/m.[name] Filed Weights   08/25/20 0933  Weight: 92.5 kg    Family History  Problem Relation Age of Onset   Arrhythmia Mother        has PPM   Heart disease Father        Died at 52, started in his 49s, heart attacks, had PPM and ICD     Atrial Fibrillation Management history:  Previous antiarrhythmic  drugs: amiodarone  Previous cardioversions: none Previous ablations: none CHADS2VASC score: 0 Anticoagulation history: none   Past Medical History:  Diagnosis Date   Dyspnea    Family history of adverse reaction to anesthesia    brother with seizures had episode under anesthesia.  Patient has seizures as well.   GERD (gastroesophageal reflux disease)    History of radiation therapy 04/24/2020-05/16/2020   IMRT to right lung     Dr [name]   PAF (paroxysmal atrial fibrillation) ([place])    CHADS2VSAC score 0   Rheumatoid aortitis    Squamous cell lung cancer [facility])    Past Surgical History:  Procedure Laterality Date   BRONCHIAL BRUSHINGS  03/27/2020   Procedure: BRONCHIAL BRUSHINGS;  Surgeon: [name], MD;  Location: [facility];  Service: Cardiopulmonary;;   CATARACT EXTRACTION  2016   at [place]  03/27/2020   Procedure: FINE NEEDLE ASPIRATION;  Surgeon: [name], MD;  Location: [facility];  Service: Cardiopulmonary;;   HIP SURGERY Left    VIDEO BRONCHOSCOPY WITH ENDOBRONCHIAL ULTRASOUND N/A 03/27/2020   Procedure: VIDEO BRONCHOSCOPY WITH ENDOBRONCHIAL ULTRASOUND;  Surgeon: [name], MD;  Location: [place];  Service: Cardiopulmonary;  Laterality: N/A;    Current Outpatient Medications  Medication Sig Dispense Refill   acetaminophen (TYLENOL) 500 MG tablet Take 500 mg by mouth every 6 (six) hours as needed for mild pain or moderate pain.     amiodarone (PACERONE) 200 MG tablet Take 1 tablet (200 mg total) by mouth daily. 90 tablet 3   gabapentin (NEURONTIN) 100  MG capsule Take 1 capsule (100 mg total) by mouth at bedtime. 30 capsule 0   levalbuterol (XOPENEX HFA) 45 MCG/ACT inhaler Inhale 2 puffs into the lungs every 4 (four) hours as needed for wheezing. 1 each 12   metoprolol tartrate (LOPRESSOR) 25 MG tablet Take 1 tablet (25 mg total) by mouth 2 (two) times daily. 180 tablet 3   PARoxetine (PAXIL) 10 MG tablet Take 10 mg by  mouth daily.     PRILOSEC OTC 20 MG tablet Take 20 mg by mouth at bedtime.     No current facility-administered medications for this encounter.    No Known Allergies  Social History   Socioeconomic History   Marital status: Married    Spouse name: Not on file   Number of children: Not on file   Years of education: Not on file   Highest education level: Not on file  Occupational History   Not on file  Tobacco Use   Smoking status: Former    Packs/day: 1.00    Years: 30.00    Pack years: 30.00    Types: Cigarettes    Quit date: 2018    Years since quitting: 4.5   Smokeless tobacco: Never  Vaping Use   Vaping Use: Never used  Substance and Sexual Activity   Alcohol use: Not Currently   Drug use: Never   Sexual activity: Not on file  Other Topics Concern   Not on file  Social History Narrative   Not on file   Social Determinants of Health   Financial Resource Strain: Not on file  Food Insecurity: Not on file  Transportation Needs: Not on file  Physical Activity: Not on file  Stress: Not on file  Social Connections: Not on file  Intimate Partner Violence: Not on file     ROS- All systems are reviewed and negative except as per the HPI above.  Physical Exam: Vitals:   08/25/20 0933  BP: 104/70  Pulse: 73  Weight: 92.5 kg  Height: 5\' 11"  (1.803 m)    GEN- The patient is a well appearing male, alert and oriented x 3 today.   Head- normocephalic, atraumatic Eyes-  Sclera clear, conjunctiva pink Ears- hearing intact Oropharynx- clear Neck- supple  Lungs- Clear to ausculation bilaterally, normal work of breathing Heart- Regular rate and rhythm, no murmurs, rubs or gallops  GI- soft, NT, ND, + BS Extremities- no clubbing, cyanosis, or edema MS- no significant deformity or atrophy Skin- no rash or lesion Psych- euthymic mood, full affect Neuro- strength and sensation are intact  Wt Readings from Last 3 Encounters:  08/25/20 92.5 kg  08/11/20 92.9 kg   07/22/20 92 kg    EKG today demonstrates  SR Vent. rate 73 BPM PR interval 156 ms QRS duration 80 ms QT/QTcB 418/460 ms  Echo 05/12/20 demonstrated   1. Left ventricular ejection fraction, by estimation, is 60 to 65%. The  left ventricle has normal function. The left ventricle has no regional  wall motion abnormalities. Left ventricular diastolic parameters are  indeterminate.   2. Right ventricular systolic function is normal. The right ventricular  size is normal. Tricuspid regurgitation signal is inadequate for assessing  PA pressure.   3. The mitral valve is normal in structure. Trivial mitral valve  regurgitation. No evidence of mitral stenosis.   4. The aortic valve is tricuspid. Aortic valve regurgitation is not  visualized. No aortic stenosis is present.   5. The inferior vena cava is normal in  size with greater than 50%  respiratory variability, suggesting right atrial pressure of 3 mmHg.   6. The patient is in atrial fibrillation.   Epic records are reviewed at length today  CHA2DS2-VASc Score = 0  The patient's score is based upon: CHF History: No HTN History: No Diabetes History: No Stroke History: No Vascular Disease History: No Age Score: 0 Gender Score: 0      ASSESSMENT AND PLAN: 1. Persistent Atrial Fibrillation (ICD10:  I48.19) The patient's CHA2DS2-VASc score is 0, indicating a 0.2% annual risk of stroke.   Suspect afib was 2/2 sepsis/C. Diff We discussed therapeutic options today. Will stop amiodarone given concern about drug toxicity. If he has recurrence of afib, will consider alternate AAD after 3 month washout of amio.  Continue Lopressor 25 mg BID Anticoagulation is not indicated at this time.  2. Non-small cell lung cancer Plans per pulmonology and oncology.   Follow up in the AF clinic in 3 months.    [facility] [address] [place], [place] [address] [phone] 08/25/2020 9:41 AM

## 2020-08-25 NOTE — Patient Instructions (Signed)
Stop metoprolol

## 2020-08-26 ENCOUNTER — Ambulatory Visit: Payer: 59 | Admitting: Dietician

## 2020-08-26 NOTE — Progress Notes (Signed)
Nutrition Follow-up:  Patient with stage IIIb right lung cancer. He completed radiation therapy on 06/04/20. He is receiving single agent [place].   Spoke with patient via telephone. He reports doing well. Patient reports he has a good appetite, eating 3 meals/day. Patient denies nutrition impact symptoms. He is drinking Ensure occasionally. Patient reports he does not want to gain too much weight, says he has been able to maintain his weights without daily intake of oral supplements. Yesterday he had a sausage/egg croissant for breakfast, [place] sandwich for lunch, pork chop, lima beans, roasted red potatoes for dinner. He had rice krispie treat snack, drank 3 (16 oz) bottles of water, gatorade zero, and a coke. Patient reports having less pain from RA, activity has increased.   Medications: reviewed  Labs: K 3.4  Anthropometrics: Weight 204 lb on 7/11 stable over the last 6 weeks  6/27 - 204 lb 14.4 oz 6/7 - 202 lb 12.8 oz 5/23 - 204 lb 3.2 oz   NUTRITION DIAGNOSIS: Unintentional weight loss stable   INTERVENTION:  Continue eating high calorie, high protein foods for weight maintenance Patient will drink Ensure Plus/equivalent as needed with decreased intake Patient politely declined need for additional coupons at this time Patient has contact information   MONITORING, EVALUATION, GOAL: weight trends, intake   NEXT VISIT: via telephone ~4 weeks

## 2020-08-28 ENCOUNTER — Other Ambulatory Visit: Payer: Self-pay

## 2020-08-28 ENCOUNTER — Encounter: Payer: Self-pay | Admitting: Emergency Medicine

## 2020-08-28 ENCOUNTER — Ambulatory Visit (INDEPENDENT_AMBULATORY_CARE_PROVIDER_SITE_OTHER): Payer: 59 | Admitting: Emergency Medicine

## 2020-08-28 DIAGNOSIS — C3491 Malignant neoplasm of unspecified part of right bronchus or lung: Secondary | ICD-10-CM | POA: Diagnosis not present

## 2020-08-28 DIAGNOSIS — J449 Chronic obstructive pulmonary disease, unspecified: Secondary | ICD-10-CM | POA: Diagnosis not present

## 2020-08-28 NOTE — Progress Notes (Signed)
Subjective:    Patient ID: John [name], male    DOB: [birth year], 60 y.o.   MRN: [id number]  HPI 60 yo former smoker (30 pack years) with a history of rheumatoid arthritis followed [facility] rheumatology and on Simponi, prednisone 5 mg daily, Arava. Hx of seizures in the past, formerly on dilantin, off this since his 23's.  He had COVID end of 2021, resulted in SOB. He is here to evaluate an abnormal CT chest.  He developed cough and hemoptysis beginning about 2.5 weeks ago.  He has slowly progressive dyspnea over a few years. He exerts, works on hoarse farm, having more trouble doing chores.   CXR was performed and then CT chest done on 03/11/2020.  I have the report available but not the films.  Note was made of a 7.2 cm mass contiguous with the right hilum and along the right bronchial tree lobulated and irregular with some associated cavitation.  There are numerous satellite nodules present.  There is occlusion of right upper lobe segmental bronchi consistent with endobronchial involvement.  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  His other past medical history significant for rheumatoid arthritis, seizures, COVID-19 in late 2021.  Pathology results showed squamous cell lung cancer.  He received palliative radiation, systemic chemotherapy.  Unfortunately since I last saw him and his cancer therapy was initiated he was hospitalized for septic shock due to E. coli bacteremia and C. difficile colitis.  Course was complicated by new onset atrial fibrillation. He remains weak. He reports severe exertional limitation, is still having labile HR, having trouble with rate control  He underwent pulmonary function testing today which I have reviewed, shows mild obstruction with a vigorous bronchodilator  response, normal lung volumes, decreased diffusion capacity.  He is not on any bronchodilator therapy  ROV 08/28/20 --John [name] is 52 with a history of rheumatoid arthritis, seizures.  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  He has mild obstruction by pulmonary function testing.  I saw him in April and we decided to use Xopenex as needed for dyspnea to see how he tolerates.  I held off on scheduled bronchodilator therapy because he had a recent hospitalization with sepsis, associated atrial fibrillation with RVR. Today he reports that he does still have some intermittent cough. His breathing is better but can still limit him. He is off amiodarone now.  He has never really tried the xopenex yet.  He is currently on [place], planning for a repeat CT chest in the next few months. Tolerating although he has some GGI   CT chest 07/18/20 reviewed by me shows stable 1.1 cm right upper lobe nodule, decrease in size of his posterior cavitary right upper lobe mass, now 5.0 x 3.2 cm, new region of right lower lobe consolidative change, groundglass.   Review of Systems As per HPI      Objective:   Physical Exam Vitals:   08/28/20 0858  BP: 112/68  Pulse: 71  Temp: 98.7 F (37.1 C)  TempSrc: Oral  SpO2: 93%  Weight: 203 lb 9.6 oz (92.4 kg)  Height: 5\' 11"  (1.803 m)   Gen: Pleasant, well-nourished, in no distress,  normal affect  ENT: No lesions,  mouth clear,  oropharynx clear,  no postnasal drip  Neck: No JVD, no stridor  Lungs: No use of accessory muscles, few insp R crackles  Cardiovascular: RRR, heart sounds normal, no murmur or gallops, no peripheral edema  Musculoskeletal: No deformities, no cyanosis or clubbing  Neuro: alert, awake, non focal  Skin: Warm, no lesions or rash      Assessment & Plan:  COPD with asthma ([place]) Mild obstruction on PFT.  He has not tried the Xopenex  but is going to try doing that going forward to see if he gets benefit.  Depending on results we may decide to consider scheduled BD  Stage 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 ([place]) Received chemoradiation and now on Keytruda.  He has had clinical response and radiographical response on most recent CT scan from early June.  He does have an area of evolving right lower lobe consolidation, question radiation change.  Consider amiodarone effects (now off), consider Keytruda effects.  If his breathing worsens or if there is a progression on next CT chest that we may need to consider coming off Keytruda, treating for possible pneumonitis.  I think that the changes are most consistent with radiation.  Time Spent 35 minutes   Baltazar Apo, MD, PhD 08/28/2020, 9:27 AM [facility] [phone] or if no answer before 7:00PM call [phone] For any issues after 7:00PM please call eLink [phone]

## 2020-08-28 NOTE — Assessment & Plan Note (Signed)
Mild obstruction on PFT.  He has not tried the Xopenex but is going to try doing that going forward to see if he gets benefit.  Depending on results we may decide to consider scheduled BD

## 2020-08-28 NOTE — Assessment & Plan Note (Signed)
Received chemoradiation and now on Keytruda.  He has had clinical response and radiographical response on most recent CT scan from early June.  He does have an area of evolving right lower lobe consolidation, question radiation change.  Consider amiodarone effects (now off), consider Keytruda effects.  If his breathing worsens or if there is a progression on next CT chest that we may need to consider coming off Keytruda, treating for possible pneumonitis.  I think that the changes are most consistent with radiation.

## 2020-08-28 NOTE — Patient Instructions (Signed)
Keep your Xopenex available and try using 2 puffs if needed for shortness of breath, chest tightness, wheezing.  You can also try using 2 puffs about 10 to 15 minutes before exertion to see if it helps your breathing.  If you do get significant benefit from this medication we can talk about possibly being on a scheduled inhaled bronchodilator at some point in the future. Agree with coming off amiodarone You do have an area of right lower lobe consolidation/inflammatory change on CT scan of the chest.  Your mass lesions have improved.  Agree that the benefits of continuing Keytruda probably outweigh the possibility that it is causing inflammation in the lung.  Plan to reassess the pros and cons of Keytruda after your next CT scan of the chest.  If your breathing worsens in the interim then we would recommend that we repeat your CT scan sooner. Follow with Dr [name] in 3 months or sooner if you have any problems.

## 2020-09-01 ENCOUNTER — Ambulatory Visit: Payer: 59

## 2020-09-01 ENCOUNTER — Other Ambulatory Visit: Payer: 59

## 2020-09-01 ENCOUNTER — Ambulatory Visit: Payer: 59 | Admitting: Internal Medicine

## 2020-09-10 ENCOUNTER — Other Ambulatory Visit: Payer: Self-pay

## 2020-09-10 ENCOUNTER — Inpatient Hospital Stay: Payer: 59

## 2020-09-10 ENCOUNTER — Encounter: Payer: Self-pay | Admitting: Internal Medicine

## 2020-09-10 ENCOUNTER — Inpatient Hospital Stay: Payer: 59 | Admitting: Internal Medicine

## 2020-09-10 VITALS — BP 106/70 | HR 76 | Temp 97.4°F | Resp 19 | Ht 71.0 in | Wt 206.7 lb

## 2020-09-10 DIAGNOSIS — C3491 Malignant neoplasm of unspecified part of right bronchus or lung: Secondary | ICD-10-CM

## 2020-09-10 DIAGNOSIS — Z5112 Encounter for antineoplastic immunotherapy: Secondary | ICD-10-CM | POA: Diagnosis not present

## 2020-09-10 DIAGNOSIS — Z5111 Encounter for antineoplastic chemotherapy: Secondary | ICD-10-CM

## 2020-09-10 DIAGNOSIS — C3490 Malignant neoplasm of unspecified part of unspecified bronchus or lung: Secondary | ICD-10-CM

## 2020-09-10 LAB — CBC WITH DIFFERENTIAL (CANCER CENTER ONLY)
Abs Immature Granulocytes: 0 10*3/uL (ref 0.00–0.07)
Basophils Absolute: 0 10*3/uL (ref 0.0–0.1)
Basophils Relative: 1 %
Eosinophils Absolute: 0.1 10*3/uL (ref 0.0–0.5)
Eosinophils Relative: 5 %
HCT: 35.3 % — ABNORMAL LOW (ref 39.0–52.0)
Hemoglobin: 11.7 g/dL — ABNORMAL LOW (ref 13.0–17.0)
Immature Granulocytes: 0 %
Lymphocytes Relative: 58 %
Lymphs Abs: 1.3 10*3/uL (ref 0.7–4.0)
MCH: 28.8 pg (ref 26.0–34.0)
MCHC: 33.1 g/dL (ref 30.0–36.0)
MCV: 86.9 fL (ref 80.0–100.0)
Monocytes Absolute: 0.5 10*3/uL (ref 0.1–1.0)
Monocytes Relative: 23 %
Neutro Abs: 0.3 10*3/uL — CL (ref 1.7–7.7)
Neutrophils Relative %: 13 %
Platelet Count: 185 10*3/uL (ref 150–400)
RBC: 4.06 MIL/uL — ABNORMAL LOW (ref 4.22–5.81)
RDW: 17 % — ABNORMAL HIGH (ref 11.5–15.5)
WBC Count: 2.2 10*3/uL — ABNORMAL LOW (ref 4.0–10.5)
nRBC: 0 % (ref 0.0–0.2)

## 2020-09-10 LAB — CMP (CANCER CENTER ONLY)
ALT: 22 U/L (ref 0–44)
AST: 31 U/L (ref 15–41)
Albumin: 2.8 g/dL — ABNORMAL LOW (ref 3.5–5.0)
Alkaline Phosphatase: 86 U/L (ref 38–126)
Anion gap: 8 (ref 5–15)
BUN: 11 mg/dL (ref 6–20)
CO2: 24 mmol/L (ref 22–32)
Calcium: 8.6 mg/dL — ABNORMAL LOW (ref 8.9–10.3)
Chloride: 106 mmol/L (ref 98–111)
Creatinine: 1.04 mg/dL (ref 0.61–1.24)
GFR, Estimated: >60 mL/min (ref >60)
Glucose, Bld: 101 mg/dL — ABNORMAL HIGH (ref 70–99)
Potassium: 4 mmol/L (ref 3.5–5.1)
Sodium: 138 mmol/L (ref 135–145)
Total Bilirubin: 0.7 mg/dL (ref 0.3–1.2)
Total Protein: 6.7 g/dL (ref 6.5–8.1)

## 2020-09-10 LAB — TSH: TSH: 2.07 u[IU]/mL (ref 0.320–4.118)

## 2020-09-10 MED ORDER — SODIUM CHLORIDE 0.9 % IV SOLN
Freq: Once | INTRAVENOUS | Status: AC
  Filled 2020-09-10: qty 250

## 2020-09-10 MED ORDER — ZIEXTENZO 6 MG/0.6ML ~~LOC~~ SOSY: 6.0000 mg | PREFILLED_SYRINGE | Freq: Once | SUBCUTANEOUS | 5 refills | Status: AC

## 2020-09-10 MED ORDER — SODIUM CHLORIDE 0.9 % IV SOLN
200.0000 mg | Freq: Once | INTRAVENOUS | Status: AC
  Administered 2020-09-10: 200 mg via INTRAVENOUS
  Filled 2020-09-10: qty 8

## 2020-09-10 NOTE — Progress Notes (Signed)
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 He was diagnosed in February 2022.  He also has rheumatoid arthritis.   PDL1: 70%   PRIOR THERAPY: Palliative radiotherapy to the large right upper lobe lung mass and mediastinal lymphadenopathy under the care of Dr. [name]  CURRENT THERAPY: Systemic chemotherapy with carboplatin for AUC of 5, paclitaxel 175 mg/M2 and Keytruda 200 mg IV every 3 weeks with Neulasta support.  First dose May 05, 2020.  Status post 6 cycles. Starting from cycle #2, the patient will be on single agent immunotherapy with Keytruda due to neutropenia with chemotherapy.   INTERVAL HISTORY: John [name] 60 y.o. male returns to the clinic today for follow-up visit accompanied by his wife.  The patient denied having any chest pain, shortness of breath, cough or hemoptysis.  He denied having any fever or chills.  He has no nausea, vomiting, diarrhea or constipation.  He has no headache or visual changes.  He continues to tolerate his treatment with single agent Keytruda fairly well.  He is here today for evaluation before starting cycle #7 of his treatment.  MEDICAL HISTORY: Past Medical History:  Diagnosis Date   Dyspnea    Family history of adverse reaction to anesthesia    brother with seizures had episode under anesthesia.  Patient has seizures as well.   GERD (gastroesophageal reflux disease)    History of radiation therapy 04/24/2020-05/16/2020   IMRT to right lung     Dr [name]   PAF (paroxysmal atrial  fibrillation) ([place])    CHADS2VSAC score 0   Rheumatoid aortitis    Squamous cell lung cancer ([place])     ALLERGIES:  has No Known Allergies.  MEDICATIONS:  Current Outpatient Medications  Medication Sig Dispense Refill   acetaminophen (TYLENOL) 500 MG tablet Take 500 mg by mouth every 6 (six) hours as needed for mild pain or moderate pain.     gabapentin (NEURONTIN) 100 MG capsule Take 1 capsule (100 mg total) by mouth at bedtime. 30 capsule 0   levalbuterol (XOPENEX HFA) 45 MCG/ACT inhaler Inhale 2 puffs into the lungs every 4 (four) hours as needed for wheezing. 1 each 12   metoprolol tartrate (LOPRESSOR) 25 MG tablet Take 1 tablet (25 mg total) by mouth 2 (two) times daily. 180 tablet 3   PARoxetine (PAXIL) 10 MG tablet Take 10 mg by mouth daily.     PRILOSEC OTC 20 MG tablet Take 20 mg by mouth at bedtime.     No current facility-administered medications for this visit.    SURGICAL HISTORY:  Past Surgical History:  Procedure Laterality Date   BRONCHIAL BRUSHINGS  03/27/2020   Procedure: BRONCHIAL BRUSHINGS;  Surgeon: [name], MD;  Location: [facility] ENDOSCOPY;  Service: Cardiopulmonary;;   CATARACT EXTRACTION  2016   at [place]  03/27/2020   Procedure: [place];  Surgeon: [name], MD;  Location: [facility] ENDOSCOPY;  Service: Cardiopulmonary;;   HIP SURGERY Left    VIDEO BRONCHOSCOPY WITH ENDOBRONCHIAL ULTRASOUND N/A 03/27/2020  Procedure: VIDEO BRONCHOSCOPY WITH ENDOBRONCHIAL ULTRASOUND;  Surgeon: [name], MD;  Location: [facility] ENDOSCOPY;  Service: Cardiopulmonary;  Laterality: N/A;    REVIEW OF SYSTEMS:  Constitutional: negative Eyes: negative Ears, nose, mouth, throat, and face: negative Respiratory: negative Cardiovascular: negative Gastrointestinal: negative Genitourinary:negative Integument/breast: negative Hematologic/lymphatic: negative Musculoskeletal:negative Neurological: negative Behavioral/Psych:  negative Endocrine: negative Allergic/Immunologic: negative   PHYSICAL EXAMINATION: General appearance: alert, cooperative, and no distress Head: Normocephalic, without obvious abnormality, atraumatic Neck: no adenopathy, no JVD, supple, symmetrical, trachea midline, and thyroid not enlarged, symmetric, no tenderness/mass/nodules Lymph nodes: Cervical, supraclavicular, and axillary nodes normal. Resp: clear to auscultation bilaterally Back: symmetric, no curvature. ROM normal. No CVA tenderness. Cardio: regular rate and rhythm, S1, S2 normal, no murmur, click, rub or gallop GI: soft, non-tender; bowel sounds normal; no masses,  no organomegaly Extremities: extremities normal, atraumatic, no cyanosis or edema Neurologic: Alert and oriented X 3, normal strength and tone. Normal symmetric reflexes. Normal coordination and gait  ECOG PERFORMANCE STATUS: 0 - Asymptomatic  Blood pressure 106/70, pulse 76, temperature (!) 97.4 F (36.3 C), temperature source Tympanic, resp. rate 19, height '5\' 11"'  (1.803 m), weight 206 lb 11.2 oz (93.8 kg), SpO2 93 %.  LABORATORY DATA: Lab Results  Component Value Date   WBC 2.2 (L) 09/10/2020   HGB 11.7 (L) 09/10/2020   HCT 35.3 (L) 09/10/2020   MCV 86.9 09/10/2020   PLT 185 09/10/2020      Chemistry      Component Value Date/Time   NA 138 09/10/2020 1106   K 4.0 09/10/2020 1106   CL 106 09/10/2020 1106   CO2 24 09/10/2020 1106   BUN 11 09/10/2020 1106   CREATININE 1.04 09/10/2020 1106      Component Value Date/Time   CALCIUM 8.6 (L) 09/10/2020 1106   ALKPHOS 86 09/10/2020 1106   AST 31 09/10/2020 1106   ALT 22 09/10/2020 1106   BILITOT 0.7 09/10/2020 1106       RADIOGRAPHIC STUDIES: No results found.  ASSESSMENT AND PLAN: This is a very pleasant 60 year old white male recently diagnosed with stage IV (T4, N2, M0/M1 C) non-small cell lung cancer, squamous cell carcinoma. 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 Diagnosed in February 2022. His PDL1 expression is 70% The patient completed palliative radiotherapy to the large right upper lobe lung mass and mediastinal lymphadenopathy. The patient started systemic chemotherapy with carboplatin for AUC of 5, paclitaxel 175 mg/M2 and Keytruda 200 mg IV every 3 weeks status post 6 cycles.  He had a rough time with the first cycle of his treatment with admission with CHF, C. Diff, and atrial fibrillation.  He also had prolonged neutropenia. Starting with cycle #2, I I recommended for the patient to discontinue chemotherapy for now due to his intolerance with fatigue/weakness and prolonged neutropenia. He is a good candidate for single agent immunotherapy with Keytruda due to his PDL1 expression of 70% which will not cause significant myelosuppression.  The patient continues to tolerate his treatment with single agent Keytruda fairly well. His absolute neutrophil count today is 300 but the patient is asymptomatic and he has been running low absolute neutrophil count for several months now. I recommended for the patient to proceed with cycle #7 today as planned but I will arrange for the patient  to receive Granix injection at least for 2 doses in the next few days to improve his total white blood count.  He was also strongly advised to go immediately to the emergency department if he develop any fever or chills or any concerning symptoms in the interval. If no improvement in his absolute neutrophil count in the next few weeks, I may consider The patient for a bone marrow biopsy and aspirate to rule out any other underlying condition. I will see him back for follow-up visit in 3 weeks for evaluation with repeat CT scan of the chest, abdomen pelvis for restaging of his disease. The patient voices  understanding of current disease status and treatment options and is in agreement with the current care plan.  All questions were answered. The patient knows to call the clinic with any problems, questions or concerns. We can certainly see the patient much sooner if necessary. The total time spent in the appointment was 30 minutes.  Disclaimer: This note was dictated with voice recognition software. Similar sounding words can inadvertently be transcribed and may not be corrected upon review.

## 2020-09-10 NOTE — Progress Notes (Signed)
Ziexteno order e-scribed to [facility] with receipt of confirmation.

## 2020-09-10 NOTE — Progress Notes (Signed)
Per Dr [name] ,it is okay to treat John [name] today with WBC of 2.2 and ANC of 0.3 . John [name] to receive GCSF -ZIextenzoat home

## 2020-09-10 NOTE — Patient Instructions (Signed)
[facility]  Discharge Instructions: ?Thank you for choosing [facility] to provide your oncology and hematology care.  ? ?If you have a lab appointment with the Cancer Center, please go directly to the Cancer Center and check in at the registration area. ?  ?Wear comfortable clothing and clothing appropriate for easy access to any Portacath or PICC line.  ? ?We strive to give you quality time with your provider. You may need to reschedule your appointment if you arrive late (15 or more minutes).  Arriving late affects you and other patients whose appointments are after yours.  Also, if you miss three or more appointments without notifying the office, you may be dismissed from the clinic at the provider?s discretion.    ?  ?For prescription refill requests, have your pharmacy contact our office and allow 72 hours for refills to be completed.   ? ?Today you received the following chemotherapy and/or immunotherapy agents: Keytruda ?  ?To help prevent nausea and vomiting after your treatment, we encourage you to take your nausea medication as directed. ? ?BELOW ARE SYMPTOMS THAT SHOULD BE REPORTED IMMEDIATELY: ?*FEVER GREATER THAN 100.4 F (38 ?C) OR HIGHER ?*CHILLS OR SWEATING ?*NAUSEA AND VOMITING THAT IS NOT CONTROLLED WITH YOUR NAUSEA MEDICATION ?*UNUSUAL SHORTNESS OF BREATH ?*UNUSUAL BRUISING OR BLEEDING ?*URINARY PROBLEMS (pain or burning when urinating, or frequent urination) ?*BOWEL PROBLEMS (unusual diarrhea, constipation, pain near the anus) ?TENDERNESS IN MOUTH AND THROAT WITH OR WITHOUT PRESENCE OF ULCERS (sore throat, sores in mouth, or a toothache) ?UNUSUAL RASH, SWELLING OR PAIN  ?UNUSUAL VAGINAL DISCHARGE OR ITCHING  ? ?Items with * indicate a potential emergency and should be followed up as soon as possible or go to the Emergency Department if any problems should occur. ? ?Please show the CHEMOTHERAPY ALERT CARD or IMMUNOTHERAPY ALERT CARD at check-in to the  Emergency Department and triage nurse. ? ?Should you have questions after your visit or need to cancel or reschedule your appointment, please contact [facility]  Dept: [phone]  and follow the prompts.  Office hours are 8:00 a.m. to 4:30 p.m. Monday - Friday. Please note that voicemails left after 4:00 p.m. may not be returned until the following business day.  We are closed weekends and major holidays. You have access to a nurse at all times for urgent questions. Please call the main number to the clinic Dept: [phone] and follow the prompts. ? ? ?For any non-urgent questions, you may also contact your provider using MyChart. We now offer e-Visits for anyone 18 and older to request care online for non-urgent symptoms. For details visit [email or web address]. ?  ?Also download the MyChart app! Go to the app store, search "MyChart", open the app, select [facility], and log in with your MyChart username and password. ? ?Due to Covid, a mask is required upon entering the hospital/clinic. If you do not have a mask, one will be given to you upon arrival. For doctor visits, patients may have 1 support person aged 18 or older with them. For treatment visits, patients cannot have anyone with them due to current Covid guidelines and our immunocompromised population.  ? ?

## 2020-09-10 NOTE — Progress Notes (Signed)
Per Dr. [name] RN "Tx today and he will get Zietenzo at home 24 hours after end of infusion".

## 2020-09-10 NOTE — Progress Notes (Unsigned)
Lab called critical ANC 0.3. Cassie [name], PA-C aware.

## 2020-09-15 ENCOUNTER — Other Ambulatory Visit: Payer: Self-pay | Admitting: Internal Medicine

## 2020-09-15 ENCOUNTER — Encounter: Payer: Self-pay | Admitting: Internal Medicine

## 2020-09-16 ENCOUNTER — Telehealth: Payer: Self-pay | Admitting: Internal Medicine

## 2020-09-16 NOTE — Telephone Encounter (Signed)
Scheduled appt per 8/1 sch msg. Pt aware.  

## 2020-09-17 ENCOUNTER — Inpatient Hospital Stay: Payer: 59 | Attending: Internal Medicine

## 2020-09-17 ENCOUNTER — Other Ambulatory Visit: Payer: Self-pay

## 2020-09-17 ENCOUNTER — Telehealth: Payer: Self-pay | Admitting: Internal Medicine

## 2020-09-17 ENCOUNTER — Telehealth: Payer: Self-pay | Admitting: *Deleted

## 2020-09-17 ENCOUNTER — Other Ambulatory Visit: Payer: Self-pay | Admitting: Pharmacist

## 2020-09-17 ENCOUNTER — Ambulatory Visit: Payer: 59 | Admitting: Emergency Medicine

## 2020-09-17 ENCOUNTER — Telehealth: Payer: Self-pay | Admitting: Medical Oncology

## 2020-09-17 DIAGNOSIS — D709 Neutropenia, unspecified: Secondary | ICD-10-CM | POA: Diagnosis not present

## 2020-09-17 DIAGNOSIS — C3491 Malignant neoplasm of unspecified part of right bronchus or lung: Secondary | ICD-10-CM

## 2020-09-17 DIAGNOSIS — C7970 Secondary malignant neoplasm of unspecified adrenal gland: Secondary | ICD-10-CM | POA: Diagnosis not present

## 2020-09-17 DIAGNOSIS — D469 Myelodysplastic syndrome, unspecified: Secondary | ICD-10-CM | POA: Insufficient documentation

## 2020-09-17 DIAGNOSIS — C3411 Malignant neoplasm of upper lobe, right bronchus or lung: Secondary | ICD-10-CM | POA: Diagnosis present

## 2020-09-17 DIAGNOSIS — Z5112 Encounter for antineoplastic immunotherapy: Secondary | ICD-10-CM | POA: Insufficient documentation

## 2020-09-17 LAB — CBC WITH DIFFERENTIAL (CANCER CENTER ONLY)
Abs Immature Granulocytes: 0 10*3/uL (ref 0.00–0.07)
Basophils Absolute: 0 10*3/uL (ref 0.0–0.1)
Basophils Relative: 1 %
Eosinophils Absolute: 0.1 10*3/uL (ref 0.0–0.5)
Eosinophils Relative: 3 %
HCT: 35.3 % — ABNORMAL LOW (ref 39.0–52.0)
Hemoglobin: 11.5 g/dL — ABNORMAL LOW (ref 13.0–17.0)
Immature Granulocytes: 0 %
Lymphocytes Relative: 60 %
Lymphs Abs: 1.3 10*3/uL (ref 0.7–4.0)
MCH: 28.5 pg (ref 26.0–34.0)
MCHC: 32.6 g/dL (ref 30.0–36.0)
MCV: 87.4 fL (ref 80.0–100.0)
Monocytes Absolute: 0.5 10*3/uL (ref 0.1–1.0)
Monocytes Relative: 25 %
Neutro Abs: 0.2 10*3/uL — CL (ref 1.7–7.7)
Neutrophils Relative %: 11 %
Platelet Count: 188 10*3/uL (ref 150–400)
RBC: 4.04 MIL/uL — ABNORMAL LOW (ref 4.22–5.81)
RDW: 16.5 % — ABNORMAL HIGH (ref 11.5–15.5)
WBC Count: 2.1 10*3/uL — ABNORMAL LOW (ref 4.0–10.5)
nRBC: 0 % (ref 0.0–0.2)

## 2020-09-17 LAB — CMP (CANCER CENTER ONLY)
ALT: 30 U/L (ref 0–44)
AST: 36 U/L (ref 15–41)
Albumin: 2.9 g/dL — ABNORMAL LOW (ref 3.5–5.0)
Alkaline Phosphatase: 96 U/L (ref 38–126)
Anion gap: 7 (ref 5–15)
BUN: 11 mg/dL (ref 6–20)
CO2: 27 mmol/L (ref 22–32)
Calcium: 8.8 mg/dL — ABNORMAL LOW (ref 8.9–10.3)
Chloride: 104 mmol/L (ref 98–111)
Creatinine: 1.06 mg/dL (ref 0.61–1.24)
GFR, Estimated: >60 mL/min (ref >60)
Glucose, Bld: 96 mg/dL (ref 70–99)
Potassium: 4.2 mmol/L (ref 3.5–5.1)
Sodium: 138 mmol/L (ref 135–145)
Total Bilirubin: 0.7 mg/dL (ref 0.3–1.2)
Total Protein: 6.8 g/dL (ref 6.5–8.1)

## 2020-09-17 NOTE — Telephone Encounter (Signed)
CRITICAL VALUE STICKER  CRITICAL VALUE: ANC 0.2  RECEIVER (on-site recipient of call): Georgina Pillion, RN  DATE & TIME NOTIFIED: 09/17/20; 7867  MESSENGER (representative from lab): Delsa Sale  MD NOTIFIED: Dr. Matilde Sprang, RN  TIME OF NOTIFICATION:0925  RESPONSE: Information acknowledged

## 2020-09-17 NOTE — Telephone Encounter (Addendum)
CRITICAL VALUE STICKER  CRITICAL VALUE:ANC  0.2  RECEIVER (on-site recipient of call):Angalina Ante  DATE & TIME NOTIFIED: 09/17/2020 @ 5615  MESSENGER (representative from lab): Ansyi MD NOTIFIED: [name] , [name]  TIME OF NOTIFICATION: 0930  RESPONSE:  zarxio 480 ordered   Pt aware of result and orders for zarxio.  1609-  I called pt to expect a call from a scheduler for his Zarixo injections for his neutropenia for  3 doses. Marko Plume still pending.   He was very upset and said he was told he would only get 2 doses.   He request to be transferred [facility]  He stated we are too busy and he has to wait too long to have anything done.   Message sent to [facility] and Antonietta Barcelona to arrange transfer.   Addendum -Pt stated he does not want any injections for his neutropenia and will not come back to [facility] and to [facility].

## 2020-09-17 NOTE — Telephone Encounter (Signed)
Scheduled appts per 8/1 sch msg. Pt aware.  

## 2020-09-18 ENCOUNTER — Encounter: Payer: Self-pay | Admitting: Internal Medicine

## 2020-09-18 NOTE — Telephone Encounter (Signed)
Pt appt at [facility] is not until next week.   I contacted pt and reviewed neutropenic precautions and when to call /go to ED: If his temp >/=100.5 f, and any other signs of infection.

## 2020-09-22 ENCOUNTER — Encounter: Payer: Self-pay | Admitting: Hematology & Oncology

## 2020-09-22 ENCOUNTER — Inpatient Hospital Stay: Payer: 59

## 2020-09-22 ENCOUNTER — Telehealth: Payer: Self-pay | Admitting: *Deleted

## 2020-09-22 ENCOUNTER — Ambulatory Visit: Payer: 59

## 2020-09-22 ENCOUNTER — Inpatient Hospital Stay (HOSPITAL_BASED_OUTPATIENT_CLINIC_OR_DEPARTMENT_OTHER): Payer: 59 | Admitting: Hematology & Oncology

## 2020-09-22 ENCOUNTER — Other Ambulatory Visit: Payer: 59 | Admitting: *Deleted

## 2020-09-22 ENCOUNTER — Other Ambulatory Visit: Payer: Self-pay

## 2020-09-22 VITALS — BP 103/60 | HR 68 | Temp 98.7°F | Resp 18 | Wt 204.0 lb

## 2020-09-22 DIAGNOSIS — I480 Paroxysmal atrial fibrillation: Secondary | ICD-10-CM

## 2020-09-22 DIAGNOSIS — C3491 Malignant neoplasm of unspecified part of right bronchus or lung: Secondary | ICD-10-CM

## 2020-09-22 DIAGNOSIS — Z5112 Encounter for antineoplastic immunotherapy: Secondary | ICD-10-CM | POA: Diagnosis not present

## 2020-09-22 LAB — CBC WITH DIFFERENTIAL (CANCER CENTER ONLY)
Abs Immature Granulocytes: 0 10*3/uL (ref 0.00–0.07)
Basophils Absolute: 0 10*3/uL (ref 0.0–0.1)
Basophils Relative: 1 %
Eosinophils Absolute: 0.1 10*3/uL (ref 0.0–0.5)
Eosinophils Relative: 4 %
HCT: 33.2 % — ABNORMAL LOW (ref 39.0–52.0)
Hemoglobin: 11 g/dL — ABNORMAL LOW (ref 13.0–17.0)
Immature Granulocytes: 0 %
Lymphocytes Relative: 59 %
Lymphs Abs: 1.4 10*3/uL (ref 0.7–4.0)
MCH: 29.2 pg (ref 26.0–34.0)
MCHC: 33.1 g/dL (ref 30.0–36.0)
MCV: 88.1 fL (ref 80.0–100.0)
Monocytes Absolute: 0.6 10*3/uL (ref 0.1–1.0)
Monocytes Relative: 25 %
Neutro Abs: 0.3 10*3/uL — CL (ref 1.7–7.7)
Neutrophils Relative %: 11 %
Platelet Count: 189 10*3/uL (ref 150–400)
RBC: 3.77 MIL/uL — ABNORMAL LOW (ref 4.22–5.81)
RDW: 16.6 % — ABNORMAL HIGH (ref 11.5–15.5)
WBC Count: 2.3 10*3/uL — ABNORMAL LOW (ref 4.0–10.5)
nRBC: 0 % (ref 0.0–0.2)

## 2020-09-22 LAB — CMP (CANCER CENTER ONLY)
ALT: 22 U/L (ref 0–44)
AST: 32 U/L (ref 15–41)
Albumin: 3 g/dL — ABNORMAL LOW (ref 3.5–5.0)
Alkaline Phosphatase: 81 U/L (ref 38–126)
Anion gap: 7 (ref 5–15)
BUN: 14 mg/dL (ref 6–20)
CO2: 28 mmol/L (ref 22–32)
Calcium: 8.7 mg/dL — ABNORMAL LOW (ref 8.9–10.3)
Chloride: 100 mmol/L (ref 98–111)
Creatinine: 1.18 mg/dL (ref 0.61–1.24)
GFR, Estimated: >60 mL/min (ref >60)
Glucose, Bld: 81 mg/dL (ref 70–99)
Potassium: 3.8 mmol/L (ref 3.5–5.1)
Sodium: 135 mmol/L (ref 135–145)
Total Bilirubin: 0.6 mg/dL (ref 0.3–1.2)
Total Protein: 6.1 g/dL — ABNORMAL LOW (ref 6.5–8.1)

## 2020-09-22 LAB — TSH: TSH: 2.94 u[IU]/mL (ref 0.450–4.500)

## 2020-09-22 LAB — ALT: ALT: 22 IU/L (ref 0–44)

## 2020-09-22 LAB — LACTATE DEHYDROGENASE: LDH: 215 U/L — ABNORMAL HIGH (ref 98–192)

## 2020-09-22 NOTE — Telephone Encounter (Signed)
[name] notified of ANC-0.3.  No new orders received at this time.

## 2020-09-22 NOTE — Progress Notes (Signed)
Hematology and Oncology Follow Up Visit  John [name] [id number] [birth year] 60 y.o. 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  He was seen at the [facility].  However, he would like to be treated at the [facility].  I think that logistically [facility] might be easier for that to happen.  He is very nice.  He comes in with his wife.  He is a 68 year old white male.  He was diagnosed back in February of this year.  He initially was treated with chemotherapy and immunotherapy.  However, he had an incredibly bad reaction to chemotherapy.  He has been incredibly leukopenic since then.  He was started on chemotherapy.  He had 6 cycles and then he was just given Keytruda.  His last CT scan was done back in June.  He had a positive response to treatment.  The right upper lobe mass had decreased in size.  There was no new or progressive metastatic disease.  He has some changes reflective of likely postradiation changes.  He has mild splenomegaly.  I suppose this might be part of why he has the leukopenia.  He feels well.  He has a low albumin which is surprising.  He seems to be fairly fit.  I am just surprised that his albumin is only 3.  I told he and his wife about try to increase his protein intake.  He has had no cough.  Is been no hemoptysis.  He has had no nausea or vomiting.  He has had no rashes.  Is been no leg swelling.  He has had no headache.  Overall, I would have to say that his performance status is by ECOG 0.  Medications:  Current Outpatient Medications:    acetaminophen (TYLENOL) 500 MG tablet, Take 500 mg by mouth every 6 (six) hours as needed for mild pain or moderate pain., Disp: , Rfl:    gabapentin (NEURONTIN) 100 MG  capsule, Take 1 capsule (100 mg total) by mouth at bedtime., Disp: 30 capsule, Rfl: 0   levalbuterol (XOPENEX HFA) 45 MCG/ACT inhaler, Inhale 2 puffs into the lungs every 4 (four) hours as needed for wheezing., Disp: 1 each, Rfl: 12   metoprolol tartrate (LOPRESSOR) 25 MG tablet, Take 1 tablet (25 mg total) by mouth 2 (two) times daily., Disp: 180 tablet, Rfl: 3   PARoxetine (PAXIL) 10 MG tablet, Take 10 mg by mouth daily., Disp: , Rfl:    PRILOSEC OTC 20 MG tablet, Take 20 mg by mouth at bedtime., Disp: , Rfl:    SIMPONI 72 MG/0.5ML SOAJ, Inject into the skin., Disp: , Rfl:   Allergies: No Known Allergies  Past Medical History, Surgical history, Social history, and Family History were reviewed and updated.  Review of Systems: Review of Systems  Constitutional: Negative.   HENT:  Negative.    Eyes: Negative.   Respiratory: Negative.    Cardiovascular: Negative.   Gastrointestinal: Negative.   Endocrine: Negative.   Genitourinary: Negative.    Musculoskeletal: Negative.   Skin: Negative.   Neurological: Negative.   Hematological: Negative.   Psychiatric/Behavioral: Negative.     Physical Exam:  weight is 204 lb (92.5 kg). His oral temperature is 98.7 F (37.1 C). His  blood pressure is 103/60 and his pulse is 68. His respiration is 18 and oxygen saturation is 96%.   Wt Readings from Last 3 Encounters:  09/22/20 204 lb (92.5 kg)  09/10/20 206 lb 11.2 oz (93.8 kg)  08/28/20 203 lb 9.6 oz (92.4 kg)    Physical Exam Vitals reviewed.  HENT:     Head: Normocephalic and atraumatic.  Eyes:     Pupils: Pupils are equal, round, and reactive to light.  Cardiovascular:     Rate and Rhythm: Normal rate and regular rhythm.     Heart sounds: Normal heart sounds.  Pulmonary:     Effort: Pulmonary effort is normal.     Breath sounds: Normal breath sounds.  Abdominal:     General: Bowel sounds are normal.     Palpations: Abdomen is soft.  Musculoskeletal:        General: No  tenderness or deformity. Normal range of motion.     Cervical back: Normal range of motion.  Lymphadenopathy:     Cervical: No cervical adenopathy.  Skin:    General: Skin is warm and dry.     Findings: No erythema or rash.  Neurological:     Mental Status: He is alert and oriented to person, place, and time.  Psychiatric:        Behavior: Behavior normal.        Thought Content: Thought content normal.        Judgment: Judgment normal.     Lab Results  Component Value Date   WBC 2.3 (L) 09/22/2020   HGB 11.0 (L) 09/22/2020   HCT 33.2 (L) 09/22/2020   MCV 88.1 09/22/2020   PLT 189 09/22/2020     Chemistry      Component Value Date/Time   NA 135 09/22/2020 1353   K 3.8 09/22/2020 1353   CL 100 09/22/2020 1353   CO2 28 09/22/2020 1353   BUN 14 09/22/2020 1353   CREATININE 1.18 09/22/2020 1353      Component Value Date/Time   CALCIUM 8.7 (L) 09/22/2020 1353   ALKPHOS 81 09/22/2020 1353   AST 32 09/22/2020 1353   ALT 22 09/22/2020 1353   BILITOT 0.6 09/22/2020 1353      Impression and Plan: John [name] is a very nice 60 year old white male.  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  Thankfully, his tumor does have a high PD-L1 score.  He is on immunotherapy right now.  I would like to think that he is responding to immunotherapy.  We do have to do another scan on him.  I will try to set 1 up for him next week.  He is due for his next treatment in 2 weeks.  Again, I had believe that he is responding.  I think as long as he is responding to pembrolizumab, we can keep him on this.  I offered the opportunity to take pembrolizumab every 6 weeks.  He would prefer the every 3 weeks.  He does not want to change anything right now as long as treatment is working.  It was wonderful talking with he and his wife.  He has a very strong faith.  I did give him a prayer blanket and he was very thankful for this.    Volanda Napoleon, MD 8/8/20225:22 PM

## 2020-09-25 ENCOUNTER — Telehealth: Payer: Self-pay | Admitting: *Deleted

## 2020-09-25 ENCOUNTER — Other Ambulatory Visit: Payer: Self-pay | Admitting: *Deleted

## 2020-09-25 DIAGNOSIS — C3491 Malignant neoplasm of unspecified part of right bronchus or lung: Secondary | ICD-10-CM

## 2020-09-25 NOTE — Telephone Encounter (Signed)
Per 09/22/20 los - called and gave upcoming appointments - confirmed

## 2020-09-29 ENCOUNTER — Ambulatory Visit (HOSPITAL_COMMUNITY): Admission: RE | Admit: 2020-09-29 | Payer: 59 | Source: Ambulatory Visit

## 2020-09-30 ENCOUNTER — Inpatient Hospital Stay: Payer: 59

## 2020-09-30 ENCOUNTER — Other Ambulatory Visit: Payer: Self-pay

## 2020-09-30 ENCOUNTER — Encounter: Payer: Self-pay | Admitting: Hematology & Oncology

## 2020-09-30 ENCOUNTER — Inpatient Hospital Stay (HOSPITAL_BASED_OUTPATIENT_CLINIC_OR_DEPARTMENT_OTHER): Payer: 59 | Admitting: Hematology & Oncology

## 2020-09-30 VITALS — BP 104/71 | HR 70 | Temp 98.4°F | Resp 20 | Wt 208.0 lb

## 2020-09-30 DIAGNOSIS — C3491 Malignant neoplasm of unspecified part of right bronchus or lung: Secondary | ICD-10-CM

## 2020-09-30 DIAGNOSIS — Z5112 Encounter for antineoplastic immunotherapy: Secondary | ICD-10-CM | POA: Diagnosis not present

## 2020-09-30 LAB — CMP (CANCER CENTER ONLY)
ALT: 23 U/L (ref 0–44)
AST: 32 U/L (ref 15–41)
Albumin: 3.1 g/dL — ABNORMAL LOW (ref 3.5–5.0)
Alkaline Phosphatase: 68 U/L (ref 38–126)
Anion gap: 8 (ref 5–15)
BUN: 17 mg/dL (ref 6–20)
CO2: 26 mmol/L (ref 22–32)
Calcium: 8.8 mg/dL — ABNORMAL LOW (ref 8.9–10.3)
Chloride: 102 mmol/L (ref 98–111)
Creatinine: 1.09 mg/dL (ref 0.61–1.24)
GFR, Estimated: >60 mL/min (ref >60)
Glucose, Bld: 101 mg/dL — ABNORMAL HIGH (ref 70–99)
Potassium: 3.8 mmol/L (ref 3.5–5.1)
Sodium: 136 mmol/L (ref 135–145)
Total Bilirubin: 0.6 mg/dL (ref 0.3–1.2)
Total Protein: 6 g/dL — ABNORMAL LOW (ref 6.5–8.1)

## 2020-09-30 LAB — CBC WITH DIFFERENTIAL (CANCER CENTER ONLY)
Abs Immature Granulocytes: 0 10*3/uL (ref 0.00–0.07)
Basophils Absolute: 0 10*3/uL (ref 0.0–0.1)
Basophils Relative: 1 %
Eosinophils Absolute: 0.2 10*3/uL (ref 0.0–0.5)
Eosinophils Relative: 6 %
HCT: 34.3 % — ABNORMAL LOW (ref 39.0–52.0)
Hemoglobin: 11.4 g/dL — ABNORMAL LOW (ref 13.0–17.0)
Immature Granulocytes: 0 %
Lymphocytes Relative: 61 %
Lymphs Abs: 1.6 10*3/uL (ref 0.7–4.0)
MCH: 29.5 pg (ref 26.0–34.0)
MCHC: 33.2 g/dL (ref 30.0–36.0)
MCV: 88.6 fL (ref 80.0–100.0)
Monocytes Absolute: 0.6 10*3/uL (ref 0.1–1.0)
Monocytes Relative: 23 %
Neutro Abs: 0.2 10*3/uL — CL (ref 1.7–7.7)
Neutrophils Relative %: 9 %
Platelet Count: 203 10*3/uL (ref 150–400)
RBC: 3.87 MIL/uL — ABNORMAL LOW (ref 4.22–5.81)
RDW: 16.4 % — ABNORMAL HIGH (ref 11.5–15.5)
WBC Count: 2.6 10*3/uL — ABNORMAL LOW (ref 4.0–10.5)
nRBC: 0 % (ref 0.0–0.2)

## 2020-09-30 LAB — LACTATE DEHYDROGENASE: LDH: 219 U/L — ABNORMAL HIGH (ref 98–192)

## 2020-09-30 MED ORDER — SODIUM CHLORIDE 0.9 % IV SOLN
200.0000 mg | Freq: Once | INTRAVENOUS | Status: AC
  Administered 2020-09-30: 200 mg via INTRAVENOUS
  Filled 2020-09-30: qty 8

## 2020-09-30 MED ORDER — SODIUM CHLORIDE 0.9 % IV SOLN: Freq: Once | INTRAVENOUS | Status: AC

## 2020-09-30 NOTE — Progress Notes (Signed)
Hematology and Oncology Follow Up Visit  Viraj Liby [id number] 01-14-1961 60 y.o. 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 Rorke is back for his follow-up.  We saw him for the first time a few weeks ago.  He has metastatic squamous cell carcinoma of the lung.  He is on immune O therapy.  He supposed to have a CT scan done.  Unfortunately, this has been delayed because of insurance issues.  He feels great.  He has had no complaints.  He has had no problems with nausea or vomiting.  He has had no cough or shortness of breath.  He has had no change in bowel or bladder habits.  He does have underlying myelodysplasia I believe.  He does have chronic neutropenia.  He is on a high-protein diet.  He has a low albumin which is surprising.  He has had no problems with infections.  There is been no rashes.  He has had no leg swelling.  Overall, I would have to say that his performance status is ECOG 0.    Medications:  Current Outpatient Medications:    acetaminophen (TYLENOL) 500 MG tablet, Take 500 mg by mouth every 6 (six) hours as needed for mild pain or moderate pain., Disp: , Rfl:    gabapentin (NEURONTIN) 100 MG capsule, Take 1 capsule (100 mg total) by mouth at bedtime., Disp: 30 capsule, Rfl: 0   levalbuterol (XOPENEX HFA) 45 MCG/ACT inhaler, Inhale 2 puffs into the lungs every 4 (four) hours as needed for wheezing., Disp: 1 each, Rfl: 12   metoprolol tartrate (LOPRESSOR) 25 MG tablet, Take 1 tablet (25 mg total) by mouth 2 (two) times daily., Disp: 180 tablet, Rfl: 3   PARoxetine (PAXIL) 10 MG tablet, Take 10 mg by mouth daily., Disp: , Rfl:    predniSONE (DELTASONE) 5 MG tablet, Take 5-10 mg by mouth daily., Disp: , Rfl:    PRILOSEC OTC 20 MG tablet, Take 20 mg by mouth at bedtime., Disp: , Rfl:     SIMPONI 39 MG/0.5ML SOAJ, Inject into the skin., Disp: , Rfl:   Allergies: No Known Allergies  Past Medical History, Surgical history, Social history, and Family History were reviewed and updated.  Review of Systems: Review of Systems  Constitutional: Negative.   HENT:  Negative.    Eyes: Negative.   Respiratory: Negative.    Cardiovascular: Negative.   Gastrointestinal: Negative.   Endocrine: Negative.   Genitourinary: Negative.    Musculoskeletal: Negative.   Skin: Negative.   Neurological: Negative.   Hematological: Negative.   Psychiatric/Behavioral: Negative.     Physical Exam:  weight is 208 lb (94.3 kg). His oral temperature is 98.4 F (36.9 C). His blood pressure is 104/71 and his pulse is 70. His respiration is 20.   Wt Readings from Last 3 Encounters:  09/30/20 208 lb (94.3 kg)  09/22/20 204 lb (92.5 kg)  09/10/20 206 lb 11.2 oz (93.8 kg)    Physical Exam Vitals reviewed.  HENT:     Head: Normocephalic and atraumatic.  Eyes:     Pupils: Pupils are equal, round, and reactive to light.  Cardiovascular:     Rate and Rhythm: Normal rate and regular rhythm.     Heart sounds: Normal heart sounds.  Pulmonary:  Effort: Pulmonary effort is normal.     Breath sounds: Normal breath sounds.  Abdominal:     General: Bowel sounds are normal.     Palpations: Abdomen is soft.  Musculoskeletal:        General: No tenderness or deformity. Normal range of motion.     Cervical back: Normal range of motion.  Lymphadenopathy:     Cervical: No cervical adenopathy.  Skin:    General: Skin is warm and dry.     Findings: No erythema or rash.  Neurological:     Mental Status: He is alert and oriented to person, place, and time.  Psychiatric:        Behavior: Behavior normal.        Thought Content: Thought content normal.        Judgment: Judgment normal.     Lab Results  Component Value Date   WBC 2.6 (L) 09/30/2020   HGB 11.4 (L) 09/30/2020   HCT 34.3 (L)  09/30/2020   MCV 88.6 09/30/2020   PLT 203 09/30/2020     Chemistry      Component Value Date/Time   NA 136 09/30/2020 1343   K 3.8 09/30/2020 1343   CL 102 09/30/2020 1343   CO2 26 09/30/2020 1343   BUN 17 09/30/2020 1343   CREATININE 1.09 09/30/2020 1343      Component Value Date/Time   CALCIUM 8.8 (L) 09/30/2020 1343   ALKPHOS 68 09/30/2020 1343   AST 32 09/30/2020 1343   ALT 23 09/30/2020 1343   BILITOT 0.6 09/30/2020 1343      Impression and Plan: Mr. Bringhurst is a very nice 60 year old white male.  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  Thankfully, his tumor does have a high PD-L1 score.  He is on immunotherapy right now.  We will go ahead with his seventh cycle of pembrolizumab.  Again, I would have to believe that he is responding.  The CT scan will show [place] this.  I would like to go ahead and get him back in another 3 weeks.  It would be nice to try to get him to do every 6-week therapy but he feels more comfortable doing treatment every 3 weeks.  It is always a lot of fun talking to him.  Again he has his underlying myelodysplasia I have to believe.  He has significant neutropenia.  He has been asymptomatic with this.  We will have to watch this closely.    Volanda Napoleon, MD 8/16/20222:58 PM

## 2020-09-30 NOTE — Patient Instructions (Signed)
Pembrolizumab injection What is this medication? PEMBROLIZUMAB (pem broe liz ue mab) is a monoclonal antibody. It is used totreat certain types of cancer. This medicine may be used for other purposes; ask your health care provider orpharmacist if you have questions. COMMON BRAND NAME(S): Keytruda What should I tell my care team before I take this medication? They need to know if you have any of these conditions: autoimmune diseases like Crohn's disease, ulcerative colitis, or lupus have had or planning to have an allogeneic stem cell transplant (uses someone else's stem cells) history of organ transplant history of chest radiation nervous system problems like myasthenia gravis or Guillain-Barre syndrome an unusual or allergic reaction to pembrolizumab, other medicines, foods, dyes, or preservatives pregnant or trying to get pregnant breast-feeding How should I use this medication? This medicine is for infusion into a vein. It is given by a health careprofessional in a hospital or clinic setting. A special MedGuide will be given to you before each treatment. Be sure to readthis information carefully each time. Talk to your pediatrician regarding the use of this medicine in children. While this drug may be prescribed for children as young as 6 months for selectedconditions, precautions do apply. Overdosage: If you think you have taken too much of this medicine contact apoison control center or emergency room at once. NOTE: This medicine is only for you. Do not share this medicine with others. What if I miss a dose? It is important not to miss your dose. Call your doctor or health careprofessional if you are unable to keep an appointment. What may interact with this medication? Interactions have not been studied. This list may not describe all possible interactions. Give your health care provider a list of all the medicines, herbs, non-prescription drugs, or dietary supplements you use. Also  tell them if you smoke, drink alcohol, or use illegaldrugs. Some items may interact with your medicine. What should I watch for while using this medication? Your condition will be monitored carefully while you are receiving thismedicine. You may need blood work done while you are taking this medicine. Do not become pregnant while taking this medicine or for 4 months after stopping it. Women should inform their doctor if they wish to become pregnant or think they might be pregnant. There is a potential for serious side effects to an unborn child. Talk to your health care professional or pharmacist for more information. Do not breast-feed an infant while taking this medicine orfor 4 months after the last dose. What side effects may I notice from receiving this medication? Side effects that you should report to your doctor or health care professionalas soon as possible: allergic reactions like skin rash, itching or hives, swelling of the face, lips, or tongue bloody or black, tarry breathing problems changes in vision chest pain chills confusion constipation cough diarrhea dizziness or feeling faint or lightheaded fast or irregular heartbeat fever flushing joint pain low blood counts - this medicine may decrease the number of white blood cells, red blood cells and platelets. You may be at increased risk for infections and bleeding. muscle pain muscle weakness pain, tingling, numbness in the hands or feet persistent headache redness, blistering, peeling or loosening of the skin, including inside the mouth signs and symptoms of high blood sugar such as dizziness; dry mouth; dry skin; fruity breath; nausea; stomach pain; increased hunger or thirst; increased urination signs and symptoms of kidney injury like trouble passing urine or change in the amount of urine signs and  symptoms of liver injury like dark urine, light-colored stools, loss of appetite, nausea, right upper belly pain,  yellowing of the eyes or skin sweating swollen lymph nodes weight loss Side effects that usually do not require medical attention (report to yourdoctor or health care professional if they continue or are bothersome): decreased appetite hair loss tiredness This list may not describe all possible side effects. Call your doctor for medical advice about side effects. You may report side effects to FDA at1-800-FDA-1088. Where should I keep my medication? This drug is given in a hospital or clinic and will not be stored at home. NOTE: This sheet is a summary. It may not cover all possible information. If you have questions about this medicine, talk to your doctor, pharmacist, orhealth care provider.  2022 Elsevier/Gold Standard (2019-01-03 21:44:53)

## 2020-10-01 ENCOUNTER — Telehealth: Payer: Self-pay

## 2020-10-01 ENCOUNTER — Inpatient Hospital Stay: Payer: 59

## 2020-10-01 ENCOUNTER — Inpatient Hospital Stay: Payer: 59 | Admitting: Internal Medicine

## 2020-10-01 LAB — TSH: TSH: 2.678 u[IU]/mL (ref 0.320–4.118)

## 2020-10-03 ENCOUNTER — Encounter: Payer: Self-pay | Admitting: *Deleted

## 2020-10-03 NOTE — Progress Notes (Signed)
Patient is being treated in [facility], I took myself off care team at this time.

## 2020-10-05 ENCOUNTER — Other Ambulatory Visit: Payer: Self-pay

## 2020-10-05 ENCOUNTER — Emergency Department (HOSPITAL_BASED_OUTPATIENT_CLINIC_OR_DEPARTMENT_OTHER): Payer: 59

## 2020-10-05 ENCOUNTER — Encounter (HOSPITAL_BASED_OUTPATIENT_CLINIC_OR_DEPARTMENT_OTHER): Payer: Self-pay | Admitting: Emergency Medicine

## 2020-10-05 ENCOUNTER — Other Ambulatory Visit (HOSPITAL_BASED_OUTPATIENT_CLINIC_OR_DEPARTMENT_OTHER): Payer: Self-pay

## 2020-10-05 ENCOUNTER — Inpatient Hospital Stay (HOSPITAL_BASED_OUTPATIENT_CLINIC_OR_DEPARTMENT_OTHER)
Admission: EM | Admit: 2020-10-05 | Discharge: 2020-10-14 | DRG: 872 | Disposition: A | Discharge status: Home / Self Care | Payer: 59 | Attending: Internal Medicine | Admitting: Internal Medicine

## 2020-10-05 DIAGNOSIS — E8809 Other disorders of plasma-protein metabolism, not elsewhere classified: Secondary | ICD-10-CM | POA: Diagnosis not present

## 2020-10-05 DIAGNOSIS — A419 Sepsis, unspecified organism: Secondary | ICD-10-CM | POA: Diagnosis present

## 2020-10-05 DIAGNOSIS — R059 Cough, unspecified: Secondary | ICD-10-CM

## 2020-10-05 DIAGNOSIS — K219 Gastro-esophageal reflux disease without esophagitis: Secondary | ICD-10-CM | POA: Diagnosis not present

## 2020-10-05 DIAGNOSIS — Z801 Family history of malignant neoplasm of trachea, bronchus and lung: Secondary | ICD-10-CM

## 2020-10-05 DIAGNOSIS — Z20822 Contact with and (suspected) exposure to covid-19: Secondary | ICD-10-CM | POA: Diagnosis not present

## 2020-10-05 DIAGNOSIS — R5081 Fever presenting with conditions classified elsewhere: Secondary | ICD-10-CM | POA: Diagnosis present

## 2020-10-05 DIAGNOSIS — I700 Atherosclerosis of aorta: Secondary | ICD-10-CM | POA: Diagnosis present

## 2020-10-05 DIAGNOSIS — R161 Splenomegaly, not elsewhere classified: Secondary | ICD-10-CM | POA: Diagnosis not present

## 2020-10-05 DIAGNOSIS — D709 Neutropenia, unspecified: Secondary | ICD-10-CM | POA: Diagnosis present

## 2020-10-05 DIAGNOSIS — Z8616 Personal history of COVID-19: Secondary | ICD-10-CM

## 2020-10-05 DIAGNOSIS — I480 Paroxysmal atrial fibrillation: Secondary | ICD-10-CM | POA: Diagnosis present

## 2020-10-05 DIAGNOSIS — J432 Centrilobular emphysema: Secondary | ICD-10-CM | POA: Diagnosis not present

## 2020-10-05 DIAGNOSIS — E876 Hypokalemia: Secondary | ICD-10-CM | POA: Diagnosis not present

## 2020-10-05 DIAGNOSIS — J449 Chronic obstructive pulmonary disease, unspecified: Secondary | ICD-10-CM | POA: Diagnosis present

## 2020-10-05 DIAGNOSIS — D649 Anemia, unspecified: Secondary | ICD-10-CM | POA: Diagnosis not present

## 2020-10-05 DIAGNOSIS — A4152 Sepsis due to Pseudomonas: Secondary | ICD-10-CM | POA: Diagnosis not present

## 2020-10-05 DIAGNOSIS — J700 Acute pulmonary manifestations due to radiation: Secondary | ICD-10-CM | POA: Diagnosis present

## 2020-10-05 DIAGNOSIS — C3490 Malignant neoplasm of unspecified part of unspecified bronchus or lung: Secondary | ICD-10-CM

## 2020-10-05 DIAGNOSIS — D708 Other neutropenia: Secondary | ICD-10-CM

## 2020-10-05 DIAGNOSIS — I10 Essential (primary) hypertension: Secondary | ICD-10-CM | POA: Diagnosis not present

## 2020-10-05 DIAGNOSIS — M069 Rheumatoid arthritis, unspecified: Secondary | ICD-10-CM | POA: Diagnosis not present

## 2020-10-05 DIAGNOSIS — R7881 Bacteremia: Secondary | ICD-10-CM

## 2020-10-05 DIAGNOSIS — C3411 Malignant neoplasm of upper lobe, right bronchus or lung: Secondary | ICD-10-CM | POA: Diagnosis not present

## 2020-10-05 DIAGNOSIS — C3491 Malignant neoplasm of unspecified part of right bronchus or lung: Secondary | ICD-10-CM

## 2020-10-05 DIAGNOSIS — Z79899 Other long term (current) drug therapy: Secondary | ICD-10-CM

## 2020-10-05 DIAGNOSIS — K8020 Calculus of gallbladder without cholecystitis without obstruction: Secondary | ICD-10-CM | POA: Diagnosis not present

## 2020-10-05 DIAGNOSIS — Z7901 Long term (current) use of anticoagulants: Secondary | ICD-10-CM

## 2020-10-05 DIAGNOSIS — Y842 Radiological procedure and radiotherapy as the cause of abnormal reaction of the patient, or of later complication, without mention of misadventure at the time of the procedure: Secondary | ICD-10-CM | POA: Diagnosis not present

## 2020-10-05 DIAGNOSIS — C7971 Secondary malignant neoplasm of right adrenal gland: Secondary | ICD-10-CM | POA: Diagnosis present

## 2020-10-05 DIAGNOSIS — Z7952 Long term (current) use of systemic steroids: Secondary | ICD-10-CM

## 2020-10-05 DIAGNOSIS — B965 Pseudomonas (aeruginosa) (mallei) (pseudomallei) as the cause of diseases classified elsewhere: Secondary | ICD-10-CM

## 2020-10-05 DIAGNOSIS — I2510 Atherosclerotic heart disease of native coronary artery without angina pectoris: Secondary | ICD-10-CM | POA: Diagnosis present

## 2020-10-05 DIAGNOSIS — Z923 Personal history of irradiation: Secondary | ICD-10-CM

## 2020-10-05 DIAGNOSIS — Z888 Allergy status to other drugs, medicaments and biological substances status: Secondary | ICD-10-CM

## 2020-10-05 DIAGNOSIS — D630 Anemia in neoplastic disease: Secondary | ICD-10-CM | POA: Diagnosis present

## 2020-10-05 DIAGNOSIS — D849 Immunodeficiency, unspecified: Secondary | ICD-10-CM | POA: Diagnosis present

## 2020-10-05 DIAGNOSIS — Z8249 Family history of ischemic heart disease and other diseases of the circulatory system: Secondary | ICD-10-CM

## 2020-10-05 DIAGNOSIS — E871 Hypo-osmolality and hyponatremia: Secondary | ICD-10-CM | POA: Diagnosis not present

## 2020-10-05 DIAGNOSIS — I959 Hypotension, unspecified: Secondary | ICD-10-CM | POA: Diagnosis present

## 2020-10-05 DIAGNOSIS — Z87891 Personal history of nicotine dependence: Secondary | ICD-10-CM

## 2020-10-05 LAB — COMPREHENSIVE METABOLIC PANEL
ALT: 26 U/L (ref 0–44)
AST: 36 U/L (ref 15–41)
Albumin: 2.9 g/dL — ABNORMAL LOW (ref 3.5–5.0)
Alkaline Phosphatase: 74 U/L (ref 38–126)
Anion gap: 9 (ref 5–15)
BUN: 14 mg/dL (ref 6–20)
CO2: 25 mmol/L (ref 22–32)
Calcium: 8.3 mg/dL — ABNORMAL LOW (ref 8.9–10.3)
Chloride: 95 mmol/L — ABNORMAL LOW (ref 98–111)
Creatinine, Ser: 1.27 mg/dL — ABNORMAL HIGH (ref 0.61–1.24)
GFR, Estimated: >60 mL/min (ref >60)
Glucose, Bld: 115 mg/dL — ABNORMAL HIGH (ref 70–99)
Potassium: 3.9 mmol/L (ref 3.5–5.1)
Sodium: 129 mmol/L — ABNORMAL LOW (ref 135–145)
Total Bilirubin: 1.3 mg/dL — ABNORMAL HIGH (ref 0.3–1.2)
Total Protein: 6.8 g/dL (ref 6.5–8.1)

## 2020-10-05 LAB — CBC WITH DIFFERENTIAL/PLATELET
Abs Immature Granulocytes: 0 10*3/uL (ref 0.00–0.07)
Basophils Absolute: 0 10*3/uL (ref 0.0–0.1)
Basophils Relative: 1 %
Eosinophils Absolute: 0 10*3/uL (ref 0.0–0.5)
Eosinophils Relative: 0 %
HCT: 34.7 % — ABNORMAL LOW (ref 39.0–52.0)
Hemoglobin: 11.8 g/dL — ABNORMAL LOW (ref 13.0–17.0)
Immature Granulocytes: 0 %
Lymphocytes Relative: 41 %
Lymphs Abs: 0.8 10*3/uL (ref 0.7–4.0)
MCH: 29.6 pg (ref 26.0–34.0)
MCHC: 34 g/dL (ref 30.0–36.0)
MCV: 87.2 fL (ref 80.0–100.0)
Monocytes Absolute: 0.5 10*3/uL (ref 0.1–1.0)
Monocytes Relative: 26 %
Neutro Abs: 0.6 10*3/uL — ABNORMAL LOW (ref 1.7–7.7)
Neutrophils Relative %: 32 %
Platelets: 175 10*3/uL (ref 150–400)
RBC: 3.98 MIL/uL — ABNORMAL LOW (ref 4.22–5.81)
RDW: 16.2 % — ABNORMAL HIGH (ref 11.5–15.5)
Smear Review: NORMAL
WBC: 1.8 10*3/uL — ABNORMAL LOW (ref 4.0–10.5)
nRBC: 0 % (ref 0.0–0.2)

## 2020-10-05 LAB — URINALYSIS, ROUTINE W REFLEX MICROSCOPIC
Bilirubin Urine: NEGATIVE
Glucose, UA: NEGATIVE mg/dL
Hgb urine dipstick: NEGATIVE
Ketones, ur: NEGATIVE mg/dL
Leukocytes,Ua: NEGATIVE
Nitrite: NEGATIVE
Protein, ur: NEGATIVE mg/dL
Specific Gravity, Urine: 1.015 (ref 1.005–1.030)
pH: 6 (ref 5.0–8.0)

## 2020-10-05 LAB — PROTIME-INR
INR: 1.1 (ref 0.8–1.2)
Prothrombin Time: 14.3 seconds (ref 11.4–15.2)

## 2020-10-05 LAB — RESP PANEL BY RT-PCR (FLU A&B, COVID) ARPGX2
Influenza A by PCR: NEGATIVE
Influenza B by PCR: NEGATIVE
SARS Coronavirus 2 by RT PCR: NEGATIVE

## 2020-10-05 LAB — LACTIC ACID, PLASMA: Lactic Acid, Venous: 1.7 mmol/L (ref 0.5–1.9)

## 2020-10-05 MED ORDER — VANCOMYCIN HCL IN DEXTROSE 1-5 GM/200ML-% IV SOLN: 1000.0000 mg | Freq: Once | INTRAVENOUS | Status: DC

## 2020-10-05 MED ORDER — ACETAMINOPHEN 325 MG PO TABS
650.0000 mg | ORAL_TABLET | Freq: Once | ORAL | Status: AC
  Administered 2020-10-05: 650 mg via ORAL
  Filled 2020-10-05: qty 2

## 2020-10-05 MED ORDER — SODIUM CHLORIDE 0.9 % IV BOLUS (SEPSIS)
1000.0000 mL | Freq: Once | INTRAVENOUS | Status: AC
  Administered 2020-10-05: 1000 mL via INTRAVENOUS

## 2020-10-05 MED ORDER — SODIUM CHLORIDE 0.9 % IV SOLN
2.0000 g | Freq: Three times a day (TID) | INTRAVENOUS | Status: DC
  Administered 2020-10-06 – 2020-10-09 (×10): 2 g via INTRAVENOUS
  Filled 2020-10-05 (×11): qty 2

## 2020-10-05 MED ORDER — VANCOMYCIN HCL IN DEXTROSE 1-5 GM/200ML-% IV SOLN
1000.0000 mg | INTRAVENOUS | Status: AC
  Administered 2020-10-05 (×2): 1000 mg via INTRAVENOUS
  Filled 2020-10-05 (×2): qty 200

## 2020-10-05 MED ORDER — SODIUM CHLORIDE 0.9 % IV SOLN
2.0000 g | Freq: Once | INTRAVENOUS | Status: AC
  Administered 2020-10-05: 2 g via INTRAVENOUS
  Filled 2020-10-05: qty 2

## 2020-10-05 MED ORDER — VANCOMYCIN HCL 1500 MG/300ML IV SOLN
1500.0000 mg | INTRAVENOUS | Status: DC
  Filled 2020-10-05: qty 300

## 2020-10-05 NOTE — ED Provider Notes (Signed)
[facility] Provider Note   CSN: [id number] Arrival date & time: 10/05/20  1948     History Chief Complaint  Patient presents with   Fever    John [name] is a 60 y.o. male.  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  On immunotherapy.  Has neutropenia.  States that this morning he started having chills and fevers.  Has not had any other real symptoms today.  Took a dose of children's chewable Tylenol earlier.  Denies GI symptoms.  No new cough or congestion.  HPI     Past Medical History:  Diagnosis Date   Dyspnea    Family history of adverse reaction to anesthesia    brother with seizures had episode under anesthesia.  Patient has seizures as well.   GERD (gastroesophageal reflux disease)    History of radiation therapy 04/24/2020-05/16/2020   IMRT to right lung     Dr [name]   PAF (paroxysmal atrial fibrillation) ([place])    CHADS2VSAC score 0   Rheumatoid aortitis    Squamous cell lung cancer [facility])     Patient Active Problem List   Diagnosis Date Noted   Persistent atrial fibrillation ([place]) 08/25/2020   Drug-induced neutropenia ([place]) 08/11/2020   PAF (paroxysmal atrial fibrillation) ([place])    Encounter for antineoplastic immunotherapy 06/09/2020   Hypokalemia    Orthostatic hypotension    C. 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    Past Surgical History:  Procedure Laterality Date   BRONCHIAL BRUSHINGS  03/27/2020   Procedure: BRONCHIAL BRUSHINGS;  Surgeon: [name], MD;   Location: [facility];  Service: Cardiopulmonary;;   CATARACT EXTRACTION  2016   at [place]  03/27/2020   Procedure: [place];  Surgeon: [name], MD;  Location: [facility];  Service: Cardiopulmonary;;   HIP SURGERY Left    VIDEO BRONCHOSCOPY WITH ENDOBRONCHIAL ULTRASOUND N/A 03/27/2020   Procedure: VIDEO BRONCHOSCOPY WITH ENDOBRONCHIAL ULTRASOUND;  Surgeon: [name], MD;  Location: [facility];  Service: Cardiopulmonary;  Laterality: N/A;       Family History  Problem Relation Age of Onset   Arrhythmia Mother        has PPM   Heart disease Father        Died at 31, started in his 56s, heart attacks, had PPM and ICD    Social History   Tobacco Use   Smoking status: Former    Packs/day: 1.00    Years: 30.00    Pack years: 30.00    Types: Cigarettes    Quit date: 2018    Years since quitting: 4.6   Smokeless tobacco: Never  Vaping Use   Vaping Use: Never used  Substance Use Topics   Alcohol use: Not Currently   Drug use: Never    Home Medications Prior to Admission medications   Medication Sig Start Date End Date Taking? Authorizing Provider  acetaminophen (TYLENOL) 500 MG tablet Take 500 mg by mouth every 6 (  six) hours as needed for mild pain or moderate pain.    [provider]  gabapentin (NEURONTIN) 100 MG capsule Take 1 capsule (100 mg total) by mouth at bedtime. 06/30/20   [name], [name], PA-C  levalbuterol (XOPENEX HFA) 45 MCG/ACT inhaler Inhale 2 puffs into the lungs every 4 (four) hours as needed for wheezing. 06/11/20   [name], MD  metoprolol tartrate (LOPRESSOR) 25 MG tablet Take 1 tablet (25 mg total) by mouth 2 (two) times daily. 06/27/20   Sueanne Margarita, MD  PARoxetine (PAXIL) 10 MG tablet Take 10 mg by mouth daily. 01/25/20   [provider]  predniSONE (DELTASONE) 5 MG tablet Take 5-10 mg by mouth daily. 09/01/20   [provider]  PRILOSEC OTC 20 MG tablet  Take 20 mg by mouth at bedtime. 11/13/19   [provider]  SIMPONI 50 MG/0.5ML SOAJ Inject into the skin. 09/18/20   [provider]    Allergies    Patient has no known allergies.  Review of Systems   Review of Systems  Constitutional:  Positive for chills, fatigue and fever.  HENT:  Negative for ear pain and sore throat.   Eyes:  Negative for pain and visual disturbance.  Respiratory:  Negative for cough and shortness of breath.   Cardiovascular:  Negative for chest pain and palpitations.  Gastrointestinal:  Positive for nausea. Negative for abdominal pain and vomiting.  Genitourinary:  Negative for dysuria and hematuria.  Musculoskeletal:  Negative for arthralgias and back pain.  Skin:  Negative for color change and rash.  Neurological:  Negative for seizures and syncope.  All other systems reviewed and are negative.  Physical Exam Updated Vital Signs BP (!) 99/57   Pulse 99   Temp (!) 101.6 F (38.7 C) (Oral)   Resp (!) 24   Ht 5\' 10"  (1.778 m)   Wt 92.5 kg   SpO2 93%   BMI 29.27 kg/m   Physical Exam Vitals and nursing note reviewed.  Constitutional:      Appearance: He is well-developed.  HENT:     Head: Normocephalic and atraumatic.  Eyes:     Conjunctiva/sclera: Conjunctivae normal.  Cardiovascular:     Rate and Rhythm: Normal rate and regular rhythm.     Heart sounds: No murmur heard. Pulmonary:     Effort: Pulmonary effort is normal. No respiratory distress.     Breath sounds: Normal breath sounds.  Abdominal:     Palpations: Abdomen is soft.     Tenderness: There is no abdominal tenderness.  Musculoskeletal:        General: No deformity or signs of injury.     Cervical back: Neck supple.  Skin:    General: Skin is warm and dry.  Neurological:     General: No focal deficit present.     Mental Status: He is alert.  Psychiatric:        Mood and Affect: Mood normal.    ED Results / Procedures / Treatments   Labs (all labs ordered  are listed, but only abnormal results are displayed) Labs Reviewed  COMPREHENSIVE METABOLIC PANEL - Abnormal; Notable for the following components:      Result Value   Sodium 129 (*)    Chloride 95 (*)    Glucose, Bld 115 (*)    Creatinine, Ser 1.27 (*)    Calcium 8.3 (*)    Albumin 2.9 (*)    Total Bilirubin 1.3 (*)  All other components within normal limits  CBC WITH DIFFERENTIAL/PLATELET - Abnormal; Notable for the following components:   WBC 1.8 (*)    RBC 3.98 (*)    Hemoglobin 11.8 (*)    HCT 34.7 (*)    RDW 16.2 (*)    Neutro Abs 0.6 (*)    All other components within normal limits  RESP PANEL BY RT-PCR (FLU A&B, COVID) ARPGX2  CULTURE, BLOOD (ROUTINE X 2)  CULTURE, BLOOD (ROUTINE X 2)  URINE CULTURE  LACTIC ACID, PLASMA  PROTIME-INR  LACTIC ACID, PLASMA  URINALYSIS, ROUTINE W REFLEX MICROSCOPIC    EKG None  Radiology DG Chest Port 1 View  Result Date: 10/05/2020 CLINICAL DATA:  Sepsis, lung cancer EXAM: PORTABLE CHEST 1 VIEW COMPARISON:  05/12/2020, CT 07/18/2020 FINDINGS: There is dense consolidation within the right mid lung zone, progressive when compared to prior CT examination, with associated right-sided volume loss. Left lung is clear. No pneumothorax or pleural effusion. Cardiac size is within normal limits. No acute bone abnormality. IMPRESSION: Progressive, dense consolidation within the right mid lung zone. This may reflect progressive consolidative change in the setting of radiation pneumonitis,, progressive changes of post treatment fibrosis, or superimposed infection. Electronically Signed   By: Fidela Salisbury M.D.   On: 10/05/2020 21:04    Procedures .Critical Care  Date/Time: 10/05/2020 9:23 PM Performed by: Lucrezia Starch, MD Authorized by: Lucrezia Starch, MD   Critical care provider statement:    Critical care time (minutes):  43   Critical care was time spent personally by me on the following activities:  Discussions with consultants,  evaluation of patient's response to treatment, examination of patient, ordering and performing treatments and interventions, ordering and review of laboratory studies, ordering and review of radiographic studies, pulse oximetry, re-evaluation of patient's condition, obtaining history from patient or surrogate and review of old charts   Medications Ordered in ED Medications  vancomycin (VANCOCIN) IVPB 1000 mg/200 mL premix (1,000 mg Intravenous New Bag/Given 10/05/20 2121)  ceFEPIme (MAXIPIME) 2 g in sodium chloride 0.9 % 100 mL IVPB (has no administration in time range)  vancomycin (VANCOREADY) IVPB 1500 mg/300 mL (has no administration in time range)  sodium chloride 0.9 % bolus 1,000 mL (1,000 mLs Intravenous New Bag/Given 10/05/20 2051)  ceFEPIme (MAXIPIME) 2 g in sodium chloride 0.9 % 100 mL IVPB (2 g Intravenous New Bag/Given 10/05/20 2054)  acetaminophen (TYLENOL) tablet 650 mg (650 mg Oral Given 10/05/20 2035)    ED Course  I have reviewed the triage vital signs and the nursing notes.  Pertinent labs & imaging results that were available during my care of the patient were reviewed by me and considered in my medical decision making (see chart for details).    MDM Rules/Calculators/A&P                          60 year old male presents to ER with concern for fever.  Has history of stage IV squamous cell cancer, neutropenia.  Febrile to 101.83F.  Concern for neutropenic fever.  Initiated sepsis orders, broad-spectrum antibiotics, blood cultures.  WBC 1.8. ANC 600. Pt remains well appearing in no distress. Will c/s [facility] for admit.   Final Clinical Impression(s) / ED Diagnoses Final diagnoses:  Neutropenic fever ([place])    Rx / DC Orders ED Discharge Orders     None        Lucrezia Starch, MD 10/05/20 2159

## 2020-10-05 NOTE — ED Notes (Signed)
Pt was informed a urine sample and culture are needed. He states he does not have to urinate at this time. Pt was given a urinal and instructed to please press call bell when he has to urinate.

## 2020-10-05 NOTE — ED Notes (Signed)
Pt ambulatory to room with independent steady gait

## 2020-10-05 NOTE — Progress Notes (Signed)
Pharmacy Antibiotic Note  John [name] is a 60 y.o. male admitted on 10/05/2020 with sepsis.  Pharmacy has been consulted for Cefepime and vancomycin dosing.  WBC low, SCr 1.27   Plan: -Cefepime 2 gm IV Q 8 hours  -Vancomycin 2 gm IV load followed by Vancomycin 1500 mg IV Q 24 hrs. Goal AUC 400-550. Expected AUC: 467 SCr used: 1.27 -Monitor CBC, renal fx, cultures and clinical progress -Vanc levels as indicated    Height: 5\' 10"  (177.8 cm) Weight: 92.5 kg (204 lb) IBW/kg (Calculated) : 73  Temp (24hrs), Avg:101.7 F (38.7 C), Min:101.6 F (38.7 C), Max:101.8 F (38.8 C)  Recent Labs  Lab 09/30/20 1343 10/05/20 2040  WBC 2.6* 1.8*  CREATININE 1.09 1.27*  LATICACIDVEN  --  1.7    Estimated Creatinine Clearance: 83.4 mL/min (by C-G formula based on SCr of 1.09 mg/dL).    No Known Allergies  Antimicrobials this admission: Cefepime 8/21 >>  Vancomycin 8/21 >>   Dose adjustments this admission:   Microbiology results: 8/21 BCx >> 8/21 UCx >>   Thank you for allowing pharmacy to be a part of this patient's care.  Albertina Parr, PharmD., BCPS, BCCCP Clinical Pharmacist Please refer to [facility] for unit-specific pharmacist

## 2020-10-05 NOTE — ED Notes (Addendum)
Both sets of blood cultures obtained prior to the start of antibiotics.

## 2020-10-05 NOTE — ED Triage Notes (Addendum)
Pt c/o fever that started today; currently receiving Keytruda for lung cancer; denies other sxs; last dose of Tylenol at 1830

## 2020-10-05 NOTE — ED Notes (Signed)
X-ray at bedside

## 2020-10-05 NOTE — Sepsis Progress Note (Signed)
[facility] following for sepsis protocol. 

## 2020-10-05 NOTE — ED Notes (Signed)
Staff @bedside  drawing labs, asked imaging to return in 10 min.

## 2020-10-06 ENCOUNTER — Observation Stay (HOSPITAL_COMMUNITY): Payer: 59

## 2020-10-06 ENCOUNTER — Other Ambulatory Visit: Payer: 59

## 2020-10-06 ENCOUNTER — Encounter (HOSPITAL_COMMUNITY): Payer: Self-pay | Admitting: Internal Medicine

## 2020-10-06 ENCOUNTER — Other Ambulatory Visit: Payer: Self-pay | Admitting: Medical Oncology

## 2020-10-06 ENCOUNTER — Telehealth: Payer: Self-pay | Admitting: Orthopedic Surgery

## 2020-10-06 DIAGNOSIS — R161 Splenomegaly, not elsewhere classified: Secondary | ICD-10-CM | POA: Diagnosis present

## 2020-10-06 DIAGNOSIS — I9589 Other hypotension: Secondary | ICD-10-CM | POA: Diagnosis not present

## 2020-10-06 DIAGNOSIS — D61818 Other pancytopenia: Secondary | ICD-10-CM

## 2020-10-06 DIAGNOSIS — I952 Hypotension due to drugs: Secondary | ICD-10-CM | POA: Diagnosis not present

## 2020-10-06 DIAGNOSIS — D72819 Decreased white blood cell count, unspecified: Secondary | ICD-10-CM | POA: Diagnosis not present

## 2020-10-06 DIAGNOSIS — I4891 Unspecified atrial fibrillation: Secondary | ICD-10-CM | POA: Diagnosis not present

## 2020-10-06 DIAGNOSIS — R5081 Fever presenting with conditions classified elsewhere: Secondary | ICD-10-CM | POA: Diagnosis present

## 2020-10-06 DIAGNOSIS — J449 Chronic obstructive pulmonary disease, unspecified: Secondary | ICD-10-CM

## 2020-10-06 DIAGNOSIS — D630 Anemia in neoplastic disease: Secondary | ICD-10-CM | POA: Diagnosis present

## 2020-10-06 DIAGNOSIS — C3490 Malignant neoplasm of unspecified part of unspecified bronchus or lung: Secondary | ICD-10-CM | POA: Diagnosis not present

## 2020-10-06 DIAGNOSIS — A419 Sepsis, unspecified organism: Secondary | ICD-10-CM | POA: Diagnosis not present

## 2020-10-06 DIAGNOSIS — I959 Hypotension, unspecified: Secondary | ICD-10-CM | POA: Diagnosis present

## 2020-10-06 DIAGNOSIS — M069 Rheumatoid arthritis, unspecified: Secondary | ICD-10-CM | POA: Diagnosis present

## 2020-10-06 DIAGNOSIS — I700 Atherosclerosis of aorta: Secondary | ICD-10-CM | POA: Diagnosis present

## 2020-10-06 DIAGNOSIS — J700 Acute pulmonary manifestations due to radiation: Secondary | ICD-10-CM | POA: Diagnosis present

## 2020-10-06 DIAGNOSIS — I10 Essential (primary) hypertension: Secondary | ICD-10-CM | POA: Diagnosis present

## 2020-10-06 DIAGNOSIS — I2510 Atherosclerotic heart disease of native coronary artery without angina pectoris: Secondary | ICD-10-CM | POA: Diagnosis present

## 2020-10-06 DIAGNOSIS — R7881 Bacteremia: Secondary | ICD-10-CM | POA: Diagnosis not present

## 2020-10-06 DIAGNOSIS — I480 Paroxysmal atrial fibrillation: Secondary | ICD-10-CM | POA: Diagnosis present

## 2020-10-06 DIAGNOSIS — R197 Diarrhea, unspecified: Secondary | ICD-10-CM | POA: Diagnosis not present

## 2020-10-06 DIAGNOSIS — C3411 Malignant neoplasm of upper lobe, right bronchus or lung: Secondary | ICD-10-CM | POA: Diagnosis present

## 2020-10-06 DIAGNOSIS — C7971 Secondary malignant neoplasm of right adrenal gland: Secondary | ICD-10-CM | POA: Diagnosis present

## 2020-10-06 DIAGNOSIS — Y842 Radiological procedure and radiotherapy as the cause of abnormal reaction of the patient, or of later complication, without mention of misadventure at the time of the procedure: Secondary | ICD-10-CM | POA: Diagnosis present

## 2020-10-06 DIAGNOSIS — B965 Pseudomonas (aeruginosa) (mallei) (pseudomallei) as the cause of diseases classified elsewhere: Secondary | ICD-10-CM | POA: Diagnosis not present

## 2020-10-06 DIAGNOSIS — C3491 Malignant neoplasm of unspecified part of right bronchus or lung: Secondary | ICD-10-CM | POA: Diagnosis not present

## 2020-10-06 DIAGNOSIS — E8809 Other disorders of plasma-protein metabolism, not elsewhere classified: Secondary | ICD-10-CM | POA: Diagnosis present

## 2020-10-06 DIAGNOSIS — K219 Gastro-esophageal reflux disease without esophagitis: Secondary | ICD-10-CM | POA: Diagnosis present

## 2020-10-06 DIAGNOSIS — Z20822 Contact with and (suspected) exposure to covid-19: Secondary | ICD-10-CM | POA: Diagnosis present

## 2020-10-06 DIAGNOSIS — E876 Hypokalemia: Secondary | ICD-10-CM | POA: Diagnosis present

## 2020-10-06 DIAGNOSIS — D649 Anemia, unspecified: Secondary | ICD-10-CM | POA: Diagnosis present

## 2020-10-06 DIAGNOSIS — Q211 Atrial septal defect: Secondary | ICD-10-CM | POA: Diagnosis not present

## 2020-10-06 DIAGNOSIS — J432 Centrilobular emphysema: Secondary | ICD-10-CM | POA: Diagnosis present

## 2020-10-06 DIAGNOSIS — Z8616 Personal history of COVID-19: Secondary | ICD-10-CM | POA: Diagnosis not present

## 2020-10-06 DIAGNOSIS — D709 Neutropenia, unspecified: Secondary | ICD-10-CM

## 2020-10-06 DIAGNOSIS — A4152 Sepsis due to Pseudomonas: Secondary | ICD-10-CM | POA: Diagnosis present

## 2020-10-06 DIAGNOSIS — E871 Hypo-osmolality and hyponatremia: Secondary | ICD-10-CM | POA: Diagnosis present

## 2020-10-06 DIAGNOSIS — D849 Immunodeficiency, unspecified: Secondary | ICD-10-CM | POA: Diagnosis present

## 2020-10-06 DIAGNOSIS — E861 Hypovolemia: Secondary | ICD-10-CM

## 2020-10-06 DIAGNOSIS — I4819 Other persistent atrial fibrillation: Secondary | ICD-10-CM | POA: Diagnosis not present

## 2020-10-06 DIAGNOSIS — K8020 Calculus of gallbladder without cholecystitis without obstruction: Secondary | ICD-10-CM | POA: Diagnosis present

## 2020-10-06 HISTORY — DX: Sepsis, unspecified organism: A41.9

## 2020-10-06 LAB — CBC WITH DIFFERENTIAL/PLATELET
Abs Immature Granulocytes: 0.01 10*3/uL (ref 0.00–0.07)
Basophils Absolute: 0 10*3/uL (ref 0.0–0.1)
Basophils Relative: 0 %
Eosinophils Absolute: 0 10*3/uL (ref 0.0–0.5)
Eosinophils Relative: 0 %
HCT: 28.3 % — ABNORMAL LOW (ref 39.0–52.0)
Hemoglobin: 9.3 g/dL — ABNORMAL LOW (ref 13.0–17.0)
Immature Granulocytes: 1 %
Lymphocytes Relative: 41 %
Lymphs Abs: 0.6 10*3/uL — ABNORMAL LOW (ref 0.7–4.0)
MCH: 29.4 pg (ref 26.0–34.0)
MCHC: 32.9 g/dL (ref 30.0–36.0)
MCV: 89.6 fL (ref 80.0–100.0)
Monocytes Absolute: 0.4 10*3/uL (ref 0.1–1.0)
Monocytes Relative: 28 %
Neutro Abs: 0.4 10*3/uL — CL (ref 1.7–7.7)
Neutrophils Relative %: 30 %
Platelets: 132 10*3/uL — ABNORMAL LOW (ref 150–400)
RBC: 3.16 MIL/uL — ABNORMAL LOW (ref 4.22–5.81)
RDW: 16.3 % — ABNORMAL HIGH (ref 11.5–15.5)
WBC: 1.4 10*3/uL — CL (ref 4.0–10.5)
nRBC: 0 % (ref 0.0–0.2)

## 2020-10-06 LAB — COMPREHENSIVE METABOLIC PANEL
ALT: 23 U/L (ref 0–44)
AST: 27 U/L (ref 15–41)
Albumin: 2.4 g/dL — ABNORMAL LOW (ref 3.5–5.0)
Alkaline Phosphatase: 53 U/L (ref 38–126)
Anion gap: 6 (ref 5–15)
BUN: 14 mg/dL (ref 6–20)
CO2: 25 mmol/L (ref 22–32)
Calcium: 7.9 mg/dL — ABNORMAL LOW (ref 8.9–10.3)
Chloride: 102 mmol/L (ref 98–111)
Creatinine, Ser: 1.01 mg/dL (ref 0.61–1.24)
GFR, Estimated: >60 mL/min (ref >60)
Glucose, Bld: 118 mg/dL — ABNORMAL HIGH (ref 70–99)
Potassium: 3.8 mmol/L (ref 3.5–5.1)
Sodium: 133 mmol/L — ABNORMAL LOW (ref 135–145)
Total Bilirubin: 1.3 mg/dL — ABNORMAL HIGH (ref 0.3–1.2)
Total Protein: 5.7 g/dL — ABNORMAL LOW (ref 6.5–8.1)

## 2020-10-06 LAB — SODIUM, URINE, RANDOM: Sodium, Ur: 22 mmol/L

## 2020-10-06 LAB — CREATININE, URINE, RANDOM: Creatinine, Urine: 46.65 mg/dL

## 2020-10-06 LAB — CORTISOL: Cortisol, Plasma: 14 ug/dL

## 2020-10-06 LAB — PROCALCITONIN: Procalcitonin: 7.93 ng/mL

## 2020-10-06 LAB — STREP PNEUMONIAE URINARY ANTIGEN: Strep Pneumo Urinary Antigen: NEGATIVE

## 2020-10-06 LAB — LACTIC ACID, PLASMA: Lactic Acid, Venous: 1 mmol/L (ref 0.5–1.9)

## 2020-10-06 LAB — MAGNESIUM
Magnesium: 1.5 mg/dL — ABNORMAL LOW (ref 1.7–2.4)
Magnesium: 1.5 mg/dL — ABNORMAL LOW (ref 1.7–2.4)

## 2020-10-06 LAB — TSH: TSH: 2.992 u[IU]/mL (ref 0.350–4.500)

## 2020-10-06 LAB — PHOSPHORUS: Phosphorus: 3.9 mg/dL (ref 2.5–4.6)

## 2020-10-06 LAB — HIV ANTIBODY (ROUTINE TESTING W REFLEX): HIV Screen 4th Generation wRfx: REACTIVE — AB

## 2020-10-06 LAB — OSMOLALITY, URINE: Osmolality, Ur: 214 mOsm/kg — ABNORMAL LOW (ref 300–900)

## 2020-10-06 LAB — OSMOLALITY: Osmolality: 275 mOsm/kg (ref 275–295)

## 2020-10-06 MED ORDER — SODIUM CHLORIDE 0.9 % IV BOLUS
1000.0000 mL | Freq: Once | INTRAVENOUS | Status: AC
  Administered 2020-10-06: 1000 mL via INTRAVENOUS

## 2020-10-06 MED ORDER — LEVALBUTEROL HCL 0.63 MG/3ML IN NEBU: 0.6300 mg | INHALATION_SOLUTION | RESPIRATORY_TRACT | Status: DC | PRN

## 2020-10-06 MED ORDER — SODIUM CHLORIDE 0.9 % IV BOLUS
500.0000 mL | Freq: Once | INTRAVENOUS | Status: AC
  Administered 2020-10-06: 500 mL via INTRAVENOUS

## 2020-10-06 MED ORDER — ACETAMINOPHEN 325 MG PO TABS: 650.0000 mg | ORAL_TABLET | Freq: Four times a day (QID) | ORAL | Status: DC | PRN

## 2020-10-06 MED ORDER — IPRATROPIUM-ALBUTEROL 0.5-2.5 (3) MG/3ML IN SOLN
3.0000 mL | Freq: Two times a day (BID) | RESPIRATORY_TRACT | Status: DC
  Administered 2020-10-06: 3 mL via RESPIRATORY_TRACT
  Filled 2020-10-06 (×3): qty 3

## 2020-10-06 MED ORDER — ACETAMINOPHEN 650 MG RE SUPP: 650.0000 mg | Freq: Four times a day (QID) | RECTAL | Status: DC | PRN

## 2020-10-06 MED ORDER — OMEPRAZOLE MAGNESIUM 20 MG PO TBEC: 20.0000 mg | DELAYED_RELEASE_TABLET | Freq: Every day | ORAL | Status: DC

## 2020-10-06 MED ORDER — METOPROLOL TARTRATE 25 MG PO TABS
12.5000 mg | ORAL_TABLET | Freq: Two times a day (BID) | ORAL | Status: DC
  Administered 2020-10-06 – 2020-10-09 (×7): 12.5 mg via ORAL
  Filled 2020-10-06 (×7): qty 1

## 2020-10-06 MED ORDER — FILGRASTIM 480 MCG/1.6ML IJ SOLN: 480.0000 ug | Freq: Every day | INTRAMUSCULAR | Status: DC

## 2020-10-06 MED ORDER — ENOXAPARIN SODIUM 40 MG/0.4ML IJ SOSY
40.0000 mg | PREFILLED_SYRINGE | INTRAMUSCULAR | Status: DC
  Administered 2020-10-06 – 2020-10-07 (×2): 40 mg via SUBCUTANEOUS
  Filled 2020-10-06 (×2): qty 0.4

## 2020-10-06 MED ORDER — PANTOPRAZOLE SODIUM 40 MG PO TBEC
40.0000 mg | DELAYED_RELEASE_TABLET | Freq: Every day | ORAL | Status: DC
  Administered 2020-10-06 – 2020-10-13 (×9): 40 mg via ORAL
  Filled 2020-10-06 (×9): qty 1

## 2020-10-06 MED ORDER — VANCOMYCIN HCL 1250 MG/250ML IV SOLN
1250.0000 mg | Freq: Two times a day (BID) | INTRAVENOUS | Status: DC
  Administered 2020-10-06 – 2020-10-07 (×3): 1250 mg via INTRAVENOUS
  Filled 2020-10-06 (×3): qty 250

## 2020-10-06 MED ORDER — METOPROLOL TARTRATE 5 MG/5ML IV SOLN
2.5000 mg | Freq: Once | INTRAVENOUS | Status: AC
  Administered 2020-10-06: 2.5 mg via INTRAVENOUS
  Filled 2020-10-06: qty 5

## 2020-10-06 MED ORDER — SODIUM CHLORIDE 0.9 % IV SOLN: INTRAVENOUS | Status: DC

## 2020-10-06 MED ORDER — SODIUM CHLORIDE 0.9 % IV SOLN
75.0000 mL/h | INTRAVENOUS | Status: DC
  Administered 2020-10-06: 75 mL/h via INTRAVENOUS

## 2020-10-06 MED ORDER — HYDROCORTISONE NA SUCCINATE PF 100 MG IJ SOLR
100.0000 mg | Freq: Three times a day (TID) | INTRAMUSCULAR | Status: DC
  Administered 2020-10-06 – 2020-10-07 (×4): 100 mg via INTRAVENOUS
  Filled 2020-10-06 (×4): qty 2

## 2020-10-06 MED ORDER — MAGNESIUM SULFATE 4 GM/100ML IV SOLN
4.0000 g | Freq: Once | INTRAVENOUS | Status: AC
  Administered 2020-10-06: 4 g via INTRAVENOUS
  Filled 2020-10-06: qty 100

## 2020-10-06 MED ORDER — TBO-FILGRASTIM 480 MCG/0.8ML ~~LOC~~ SOSY
480.0000 ug | PREFILLED_SYRINGE | Freq: Every day | SUBCUTANEOUS | Status: DC
  Filled 2020-10-06: qty 0.8

## 2020-10-06 MED ORDER — IOHEXOL 350 MG/ML SOLN
75.0000 mL | Freq: Once | INTRAVENOUS | Status: AC | PRN
  Administered 2020-10-06: 75 mL via INTRAVENOUS

## 2020-10-06 MED ORDER — LEVALBUTEROL TARTRATE 45 MCG/ACT IN AERO: 2.0000 | INHALATION_SPRAY | RESPIRATORY_TRACT | Status: DC | PRN

## 2020-10-06 NOTE — Progress Notes (Signed)
Scans ordered by Dr [name] were cancelled as pt has a new provider.

## 2020-10-06 NOTE — Progress Notes (Signed)
   Briefly Patient seen and examined personally, I reviewed the chart, history and physical and admission note, done by admitting physician this morning and agree with the same with following addendum.  Please refer to the morning admission note for more detailed plan of care.  60 year old male with significant history of lung cancer stage IV SCC right upper lung with adrenal metastasis on immunotherapy, hypertension on Lopressor last echo normal 2022, COPD not on oxygen at baseline, A. fib not on anticoagulation due to low score, GERD, history of CAD, on Toprol admitted with With neutropenic fever, sepsis with SIRS criteria  In the ED febrile 100.1, hyponatremic neutropenic.  Placed on vancomycin/cefepime. Chest x-ray nonacute de-escalation could be radiation pneumonitis versus superimposed infection, normal lactic acid, UA no evidence of UTI, COVID-19 negative.    On exam Alert,awake, not dizzy, has been up may time times, althogh bp soft not symptomatic , had soft BM no dysurea no cough no fever. Blood pressure soft 88-92 sbp.  Blood work shows mild hyponatremia 133, hypomagnesemia 1.5, low albumin 2.4, elevated total bilirubin 1.3 with leukopenia 1400 ANC 400, anemia hemoglobin 9.3 g thrombocytopenia 132K. His wife is at the bedside.  CT chest was pending and came back as: 1. Increasingly conspicuous lymph nodes in the right hilar, mediastinal, lower right cervical and supraclavicular nodal stations concerning for potential metastatic disease, although some of this could be simply reactive in the setting of evolving postradiation changes in the right lung. Attention on follow-up studies is recommended. Treated right upper lobe lesion is once again cavitary and less apparent than on the prior examination. 2. Satellite nodule measuring 11 mm in the right upper lobe, stable compared to the prior study. 3. Right adrenal metastasis, similar to the prior study. 4. 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 5. Cholelithiasis. 6. Aortic atherosclerosis, in addition to 2 vessel coronary artery disease.    issues  Neutropenic fever SIRS/sepsis with fever neutropenia Immunosuppressed in the setting of immunotherapy, long-term prednisone use for RA No respiratory symptoms or urinary symptoms.  Procalcitonin elevated 7.9 normal lactic acid.  BP soft.  We will continue on aggressive supportive measures with IV fluids, broad-spectrum antibiotics pending further culture report.  Oncology following closely may need Granix.  Neutropenia with anemia, thrombocytopenia and neutropenia: Not due to immunotherapy per oncology.  He has predated pancytopenia prior to his immunotherapy likely chronic component.  Question if its 2/2 SIRS also.  Does have anemia of chronic disease.  Monitor counts closely and provide supportive care.  Suspect contributed by hemodilution.  Recent Labs  Lab 09/30/20 1343 10/05/20 2040 10/06/20 0430  HGB 11.4* 11.8* 9.3*  HCT 34.3* 34.7* 28.3*  WBC 2.6* 1.8* 1.4*  PLT 203 175 132*    Hypomagnesemia we will replete Hypotension-bp soft.  Appears asymptomatic continue gentle IV fluids, encourage oral intake Hyponatremia: Suspect volume depletion dehydration continue IV fluids  RA on long-term prednisone-now on stress dose hydrocortisone  SCC stage IV lung SCC with adrenal metastasis on immunotherapy-Dr Ennever notified/following.  CT chest shows lymph nodes in the right hilar mediastinal nor right cervical and supraclavicular node-?  Potentially metastatic disease versus reactive lymph nodes.  PAF not on AC, 2/2 low score, cont his  COPD with asthma with chronic wheezing- on solucortef iv DuoNebs 3 times daily  Patient needs ongoing monitoring of his counts.and with  Neutropenic fever needs ongoing IV antibiotics will be moved to inpatient

## 2020-10-06 NOTE — Progress Notes (Signed)
Patient HR up to 150s, notified by CMT.  Patient denies chest pain, SOB, dizziness at this time.  VSS stable, EKG performed.  Dr. Lupita Leash aware.  New orders placed and new medications administered.  Will continue to monitor patient closely.

## 2020-10-06 NOTE — H&P (Signed)
John [name] ERX:[id number] DOB: 15-Feb-1961 DOA: 10/05/2020     PCP: [name], MD   Outpatient Specialists:      Oncology  [name]  Pulmonology Dr. [name]  Patient arrived to ER on 10/05/20 at 1948 Referred by Attending Reubin Milan, MD   Patient coming from: home Lives  With family    Chief Complaint:   Chief Complaint  Patient presents with   Fever    HPI: John [name] is a 60 y.o. 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  On immunotherapy.  GERD, p A.fib not on anticoagulation RA, hx of C.dif, COPD    Presented with  fever started this morning.  Otherwise no URI symptoms no chest pain or shortness of breath he took Tylenol for fever denies any nausea vomiting diarrhea.  No sick contacts Reports dry cough that is chronic  Quit smoking 7y  does not drink Per [name]  He does have underlying myelodysplasia and chronic neutropenia.  Has   been vaccinated against COVID  and boosted   Initial COVID TEST  NEGATIVE   Lab Results  Component Value Date   [place] NEGATIVE 10/05/2020   [place] NEGATIVE 06/09/2020   [place] NEGATIVE 05/12/2020   [place] NEGATIVE 03/25/2020     Regarding pertinent Chronic problems:     Lung can on Keytruda every 3 wks   HTN on lopressor Last echo in 2022 WNL      COPD -  followed by pulmonology  not  on baseline oxygen     A. Fib -  - CHA2DS2 vas score 0   Not on anticoagulation low score        -  Rate control:    Toprol        Chronic anemia - baseline hg Hemoglobin & Hematocrit  Recent Labs    09/22/20 1353 09/30/20 1343 10/05/20 2040  HGB 11.0* 11.4* 11.8*     While in ER: Febrile up to 101.8 Noted to be hyponatremic at sodium 129 slightly elevated creatinine 1.27 Lower blood cell count 1.8 with absolute neutrophil count 0.6 Patient was deemed to have neutropenic fever started vancomycin  and cefepime Transferred to [place]  ED Triage Vitals  Enc Vitals Group     BP 10/05/20 1959 96/67     Pulse Rate 10/05/20 1959 (!) 124     Resp 10/05/20 1959 (!) 28     Temp 10/05/20 1959 (!) 101.8 F (38.8 C)     Temp Source 10/05/20 1959 Oral     SpO2 10/05/20 1959 91 %     Weight 10/05/20 1956 204 lb (92.5 kg)     Height 10/05/20 1956 _0  (1.778 m)     Head Circumference --      Peak Flow --      Pain Score 10/05/20 1956 0     Pain Loc --      Pain Edu? --      Excl. in [place]? --   TMAX(24)@     _________________________________________ Significant initial  Findings: Abnormal Labs Reviewed  COMPREHENSIVE METABOLIC PANEL - Abnormal; Notable for the following components:      Result Value   Sodium 129 (*)    Chloride 95 (*)    Glucose, Bld 115 (*)    Creatinine, Ser 1.27 (*)    Calcium 8.3 (*)    Albumin 2.9 (*)  Total Bilirubin 1.3 (*)    All other components within normal limits  CBC WITH DIFFERENTIAL/PLATELET - Abnormal; Notable for the following components:   WBC 1.8 (*)    RBC 3.98 (*)    Hemoglobin 11.8 (*)    HCT 34.7 (*)    RDW 16.2 (*)    Neutro Abs 0.6 (*)    All other components within normal limits   ____________________________________________ Ordered    CXR -  NON acute dense consolidation could be radiation pneumonitis versus superimposed infection      ECG: Ordered Personally reviewed by me showing: HR : 105 Rhythm:   Sinus tachycardia   no evidence of ischemic changes QTC 484 _________    The recent clinical data is shown below. Vitals:   10/05/20 2237 10/05/20 2300 10/05/20 2330 10/05/20 2335  BP: (!) 93/59 (!) 92/54 (!) 90/57 (!) 98/58  Pulse: 96 85 81 93  Resp: 20 (!) 21 19 (!) 30  Temp:      TempSrc:      SpO2: 95% 92% 92% 93%  Weight:      Height:        WBC     Component Value Date/Time   WBC 1.8 (L) 10/05/2020 2040   LYMPHSABS 0.8 10/05/2020 2040   MONOABS 0.5 10/05/2020 2040   EOSABS 0.0 10/05/2020 2040    BASOSABS 0.0 10/05/2020 2040     Lactic Acid, Venous    Component Value Date/Time   LATICACIDVEN 1.7 10/05/2020 2040     Procalcitonin  Ordered      UA   no evidence of UTI      Urine analysis:    Component Value Date/Time   COLORURINE YELLOW 10/05/2020 [place] 10/05/2020 2215   LABSPEC 1.015 10/05/2020 2215   PHURINE 6.0 10/05/2020 2215   [place] 10/05/2020 2215   [place] 10/05/2020 2215   [place] 10/05/2020 2215   [place] 10/05/2020 2215   [place] 10/05/2020 2215   NITR[place] NEGATIVE 10/05/2020 2215   [place] 10/05/2020 2215    Results for orders placed or performed during the hospital encounter of 10/05/20  Resp Panel by RT-PCR (Flu A&B, Covid) Nasopharyngeal Swab     Status: None   Collection Time: 10/05/20  8:40 PM   Specimen: Nasopharyngeal Swab; Nasopharyngeal(NP) swabs in vial transport medium  Result Value Ref Range Status   SARS Coronavirus 2 by RT PCR NEGATIVE NEGATIVE Final         Influenza A by PCR NEGATIVE NEGATIVE Final   Influenza B by PCR NEGATIVE NEGATIVE Final           _______________________________________________ Hospitalist was called for admission for neutropenic fever  The following Work up has been ordered so far:  Orders Placed This Encounter  Procedures   Critical Care   Resp Panel by RT-PCR (Flu A&B, Covid) Nasopharyngeal Swab   Blood Culture (routine x 2)   Urine Culture   DG Chest Port 1 View   Lactic acid, plasma   Comprehensive metabolic panel   CBC WITH DIFFERENTIAL   Protime-INR   Urinalysis, Routine w reflex microscopic   Diet NPO time specified   Cardiac monitoring   Document height and weight   Assess and Document [place] Coma Scale   Document vital signs within 1-hour of fluid bolus completion. Notify provider of abnormal vital signs despite fluid resuscitation.   DO NOT delay antibiotics if unable to obtain blood culture.   Refer  to Sidebar Report: Sepsis Sidebar ED/IP   Notify provider for difficulties obtaining IV access.   Insert peripheral IV x 2   Initiate Carrier Fluid Protocol   Cardiac monitoring   Code Sepsis activation.  This occurs automatically when order is signed and prioritizes pharmacy, lab, and radiology services for STAT collections and interventions.  If [facility] downtime, call [facility] [phone]) to activate Code Sepsis.   pharmacy consult   ceFEPime (MAXIPIME) per pharmacy consult            vancomycin per pharmacy consult   Consult to hospitalist  Neutropenic fever   Pulse oximetry, continuous   ED EKG 12-Lead   Place in observation (patient's expected length of stay will be less than 2 midnights)    Following Medications were ordered in ER: Medications  ceFEPIme (MAXIPIME) 2 g in sodium chloride 0.9 % 100 mL IVPB (has no administration in time range)  vancomycin (VANCOREADY) IVPB 1500 mg/300 mL (has no administration in time range)  sodium chloride 0.9 % bolus 1,000 mL (0 mLs Intravenous Stopped 10/05/20 2216)  ceFEPIme (MAXIPIME) 2 g in sodium chloride 0.9 % 100 mL IVPB (0 g Intravenous Stopped 10/05/20 2216)  acetaminophen (TYLENOL) tablet 650 mg (650 mg Oral Given 10/05/20 2035)  vancomycin (VANCOCIN) IVPB 1000 mg/200 mL premix (0 mg Intravenous Stopped 10/05/20 2319)        Consult Orders  (From admission, onward)           Start     Ordered   10/05/20 2157  Consult to hospitalist  Neutropenic fever  Once       Comments: Neutropenic fever  Provider:  (Not yet assigned)  Question Answer Comment  Place call to: [facility]   Reason for Consult Admit      10/05/20 2157             OTHER Significant initial  Findings:  labs showing:    Recent Labs  Lab 09/30/20 1343 10/05/20 2040  NA 136 129*  K 3.8 3.9  CO2 26 25  GLUCOSE 101* 115*  BUN 17 14  CREATININE 1.09 1.27*  CALCIUM 8.8* 8.3*    Cr   Up from baseline see below Lab Results  Component Value  Date   CREATININE 1.27 (H) 10/05/2020   CREATININE 1.09 09/30/2020   CREATININE 1.18 09/22/2020    Recent Labs  Lab 09/30/20 1343 10/05/20 2040  AST 32 36  ALT 23 26  ALKPHOS 68 74  BILITOT 0.6 1.3*  PROT 6.0* 6.8  ALBUMIN 3.1* 2.9*   Lab Results  Component Value Date   CALCIUM 8.3 (L) 10/05/2020   PHOS 2.2 (L) 05/14/2020    Plt: Lab Results  Component Value Date   PLT 175 10/05/2020    COVID-19 Labs  No results for input(s): DDIMER, FERRITIN, LDH, CRP in the last 72 hours.  Lab Results  Component Value Date   SARSCOV2NAA NEGATIVE 10/05/2020   SARSCOV2NAA NEGATIVE 06/09/2020   SARSCOV2NAA NEGATIVE 05/12/2020   [place] NEGATIVE 03/25/2020       Recent Labs  Lab 09/30/20 1343 10/05/20 2040  WBC 2.6* 1.8*  NEUTROABS 0.2* 0.6*  HGB 11.4* 11.8*  HCT 34.3* 34.7*  MCV 88.6 87.2  PLT 203 175    HG/HCT  stable     Component Value Date/Time   HGB 11.8 (L) 10/05/2020 2040   HGB 11.4 (L) 09/30/2020 1343   HCT 34.7 (L) 10/05/2020 2040   MCV 87.2 10/05/2020 2040  Cultures:    Component Value Date/Time   SDES  05/13/2020 1453    BLOOD RIGHT ANTECUBITAL Performed at [facility], [place] [address], [place], [place] [address]    SDES  05/13/2020 1453    BLOOD RIGHT HAND Performed at [facility], [place] [address], [place], [place] [address]    [place]  05/13/2020 1453    BOTTLES DRAWN AEROBIC AND ANAEROBIC Blood Culture adequate volume Performed at [place] [address], [place], [place] [address]    [place]  05/13/2020 1453    BOTTLES DRAWN AEROBIC AND ANAEROBIC Blood Culture adequate volume Performed at [place] [address], [place], [place] [address]    CULT  05/13/2020 1453    NO GROWTH 5 DAYS Performed at [place] [address], [place], [place] [address]    CULT  05/13/2020 1453    NO GROWTH 5 DAYS Performed at [facility], [place] [address], [place], [place] [address]    REPTSTATUS 05/18/2020 FINAL 05/13/2020 1453   REPTSTATUS 05/18/2020 FINAL 05/13/2020 1453     Radiological Exams on Admission: DG Chest Port 1 View  Result Date: 10/05/2020 CLINICAL DATA:  Sepsis, lung cancer EXAM: PORTABLE CHEST 1 VIEW COMPARISON:  05/12/2020, CT 07/18/2020 FINDINGS: There is dense consolidation within the right mid lung zone, progressive when compared to prior CT examination, with associated right-sided volume loss. Left lung is clear. No pneumothorax or pleural effusion. Cardiac size is within normal limits. No acute bone abnormality. IMPRESSION: Progressive, dense consolidation within the right mid lung zone. This may reflect progressive consolidative change in the setting of radiation pneumonitis,, progressive changes of post treatment fibrosis, or superimposed infection. Electronically Signed   By: Fidela Salisbury M.D.   On: 10/05/2020 21:04   _______________________________________________________________________________________________________ Latest  Blood pressure (!) 98/58, pulse 93, temperature 99.1 F (37.3 C), temperature source Oral, resp. rate (!) 30, height _0  (1.778 m), weight 92.5 kg, SpO2 93 %.   Review of Systems:    Pertinent positives include:   Fevers, chills, fatigue  Constitutional:  No weight loss, night sweats,, weight loss  HEENT:  No headaches, Difficulty swallowing,Tooth/dental problems,Sore throat,  No sneezing, itching, ear ache, nasal congestion, post nasal drip,  Cardio-vascular:  No chest pain, Orthopnea, PND, anasarca, dizziness, palpitations.no Bilateral lower extremity swelling  GI:  No heartburn, indigestion, abdominal pain, nausea, vomiting, diarrhea, change in bowel habits, loss of appetite, melena, blood in stool, hematemesis Resp:  no shortness of breath at rest. No dyspnea on exertion, No excess mucus, no productive cough, No non-productive cough, No coughing up of  blood.No change in color of mucus.No wheezing. Skin:  no rash or lesions. No jaundice GU:  no dysuria, change in color of urine, no urgency or frequency. No straining to urinate.  No flank pain.  Musculoskeletal:  No joint pain or no joint swelling. No decreased range of motion. No back pain.  Psych:  No change in mood or affect. No depression or anxiety. No memory loss.  Neuro: no localizing neurological complaints, no tingling, no weakness, no double vision, no gait abnormality, no slurred speech, no confusion  All systems reviewed and apart from Rew all are negative _______________________________________________________________________________________________ Past Medical History:   Past Medical History:  Diagnosis Date   Dyspnea    Family history of adverse reaction to anesthesia    brother with seizures had episode under anesthesia.  Patient has seizures as well.  GERD (gastroesophageal reflux disease)    History of radiation therapy 04/24/2020-05/16/2020   IMRT to right lung     Dr [name]   PAF (paroxysmal atrial fibrillation) ([place])    CHADS2VSAC score 0   Rheumatoid aortitis    Squamous cell lung cancer [facility])      Past Surgical History:  Procedure Laterality Date   BRONCHIAL BRUSHINGS  03/27/2020   Procedure: BRONCHIAL BRUSHINGS;  Surgeon: [name], MD;  Location: [place];  Service: Cardiopulmonary;;   CATARACT EXTRACTION  2016   at [place]  03/27/2020   Procedure: FINE NEEDLE ASPIRATION;  Surgeon: [name], MD;  Location: [facility];  Service: Cardiopulmonary;;   HIP SURGERY Left    VIDEO BRONCHOSCOPY WITH ENDOBRONCHIAL ULTRASOUND N/A 03/27/2020   Procedure: VIDEO BRONCHOSCOPY WITH ENDOBRONCHIAL ULTRASOUND;  Surgeon: [name], MD;  Location: [facility];  Service: Cardiopulmonary;  Laterality: N/A;    Social History:  Ambulatory   independently       reports that he quit smoking about 4 years ago. His smoking  use included cigarettes. He has a 30.00 pack-year smoking history. He has never used smokeless tobacco. He reports that he does not currently use alcohol. He reports that he does not use drugs.    Family History:   Family History  Problem Relation Age of Onset   Arrhythmia Mother        has PPM   Heart disease Father        Died at 9, started in his 60s, heart attacks, had PPM and ICD   ______________________________________________________________________________________________ Allergies: No Known Allergies   Prior to Admission medications   Medication Sig Start Date End Date Taking? Authorizing Provider  acetaminophen (TYLENOL) 500 MG tablet Take 500 mg by mouth every 6 (six) hours as needed for mild pain or moderate pain.    [provider]  gabapentin (NEURONTIN) 100 MG capsule Take 1 capsule (100 mg total) by mouth at bedtime. 06/30/20   [name], [name], PA-C  levalbuterol (XOPENEX HFA) 45 MCG/ACT inhaler Inhale 2 puffs into the lungs every 4 (four) hours as needed for wheezing. 06/11/20   [name], MD  metoprolol tartrate (LOPRESSOR) 25 MG tablet Take 1 tablet (25 mg total) by mouth 2 (two) times daily. 06/27/20   Sueanne Margarita, MD  PARoxetine (PAXIL) 10 MG tablet Take 10 mg by mouth daily. 01/25/20   [provider]  predniSONE (DELTASONE) 5 MG tablet Take 5-10 mg by mouth daily. 09/01/20   [provider]  PRILOSEC OTC 20 MG tablet Take 20 mg by mouth at bedtime. 11/13/19   [provider]  SIMPONI 50 MG/0.5ML SOAJ Inject into the skin. 09/18/20   [provider]    ___________________________________________________________________________________________________ Physical Exam: Vitals with BMI 10/05/2020 10/05/2020 10/05/2020  Height - - -  Weight - - -  BMI - - -  Systolic 98 90 92  Diastolic 58 57 54  Pulse 93 81 85     1. General:  in No Acute distress    Chronically ill  -appearing 2. Psychological: Alert  and  Oriented 3. Head/ENT:   Dry Mucous Membranes                          Head Non traumatic, neck supple  poor Dentition 4. SKIN:  decreased Skin turgor,  Skin clean Dry and intact no rash 5. Heart: Regular rate and rhythm no Murmur, no Rub or gallop 6. Lungs:   no wheezes or crackles   7. Abdomen: Soft,  non-tender, Non distended   8. Lower extremities: no clubbing, cyanosis, no  edema 9. Neurologically Grossly intact, moving all 4 extremities equally  intact 10. MSK: Normal range of motion    Chart has been reviewed  ______________________________________________________________________________________________  Assessment/Plan 60 y.o. 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  On immunotherapy.  GERD, p A.fib not on anticoagulation RA, hx of C.dif, COPD   Admitted for neutropenic fever  Present on Admission:  Neutropenic fever ([place]) -continue with broad spectrum antibiotics for oncology as patient has been admitted Defer to oncology if patient would benefit from Neupogen Supportive measures Most likely cause of fever is superimposed infectious process in the lungs. To further evaluate we will obtain CT chest   Sepsis ([place]) -  -SIRS criteria met with low white blood cell count,       Component Value Date/Time   WBC 1.8 (L) 10/05/2020 2040   LYMPHSABS 0.8 10/05/2020 2040      fever   RR >20 Today's Vitals   10/05/20 2300 10/05/20 2330 10/05/20 2335 10/06/20 0041  BP: (!) 92/54 (!) 90/57 (!) 98/58 (!) 88/56  Pulse: 85 81 93 86  Resp: (!) 21 19 (!) 30 20  Temp:    97.9 F (36.6 C)  TempSrc:    Oral  SpO2: 92% 92% 93% 93%  Weight:      Height:      PainSc:    0-No pain   Body mass index is 29.27 kg/m.    The recent clinical data is shown below. Vitals:   10/05/20 2300 10/05/20 2330 10/05/20 2335 10/06/20 0041  BP: (!) 92/54 (!) 90/57 (!) 98/58 (!) 88/56   Pulse: 85 81 93 86  Resp: (!) 21 19 (!) 30 20  Temp:    97.9 F (36.6 C)  TempSrc:    Oral  SpO2: 92% 92% 93% 93%  Weight:      Height:          -Most likely source being:  pulmonary      - Obtain serial lactic acid and procalcitonin level.  - Initiated IV antibiotics in ER: Antibiotics Given (last 72 hours)     Date/Time Action Medication Dose Rate   10/05/20 2054 New Bag/Given   ceFEPIme (MAXIPIME) 2 g in sodium chloride 0.9 % 100 mL IVPB 2 g 200 mL/hr   10/05/20 2121 New Bag/Given   vancomycin (VANCOCIN) IVPB 1000 mg/200 mL premix 1,000 mg 200 mL/hr   10/05/20 2222 New Bag/Given   vancomycin (VANCOCIN) IVPB 1000 mg/200 mL premix 1,000 mg 200 mL/hr       Will continue    - await results of blood and urine culture  - Rehydrate aggressively  1:21 AM   Paroxysmal A-fib (HCC) -hold metoprolol given hypotension, not on anticoagulation    COPD with asthma (HCC) -chronic stable continue home Patient's   Hyponatremia -obtain urine electrolytes rehydrate follow sodium level   RA (rheumatoid arthritis) ([place]) -patient usually on prednisone daily.  Given hypotension will give stress dose steroids   Hypotension - Differential includes hypovolemia versus sepsis.  Will transfer to progressive care.  Patient mentating well but continues to be persistently hypotensive.  Administer aggressive IV fluids Blood cultures obtained Continue broad-spectrum antibiotics if continues to decompensate will need PCCM consult No CP  Other plan as per orders.  DVT prophylaxis:   Lovenox       Code Status:    Code Status: Prior FULL CODE   as per patient   I had personally discussed CODE STATUS with patient      Family Communication:   Family not at  Bedside    Disposition Plan:    To home once workup is complete and patient is stable   Following barriers for discharge:                            Electrolytes corrected                                                               Afebrile, white count improving able to transition to PO antibiotics                             Will need to be able to tolerate PO                            Will likely need home health, home O2, set up                           Will need consultants to evaluate patient prior to discharge                      Consults called:  emailed oncology   Admission status:  ED Disposition     ED Disposition  [place]: [facility] [[id number]]  Level of Care: Telemetry [5]  Admit to tele based on following criteria: Monitor for Ischemic changes  Interfacility transfer: Yes  May place patient in observation at [facility] or [facility] if equivalent level of care is available:: No  Covid Evaluation: Asymptomatic Screening Protocol (No Symptoms)  Diagnosis: Neutropenic fever [facility]) [[id number]]  Admitting Physician: Reubin Milan [[id number]]  Attending Physician: Reubin Milan [[id number]]           Obs    Level of care    tele  For 12H 24H     medical floor       SDU tele indefinitely please discontinue once patient no longer qualifies COVID-19 Labs    Lab Results  Component Value Date   [place] 10/05/2020     Precautions: admitted as  Covid Negative     PPE: Used by the provider:   N95  eye Goggles,  Gloves    Marlon Vonruden 10/06/2020, 2:14 AM    [facility]     after 2 AM please page floor coverage PA If 7AM-7PM, please contact the day team taking care of the patient using [email or web address]   Patient was evaluated in the context of the global COVID-19 pandemic, which necessitated consideration that the patient might be at risk for infection with the SARS-CoV-2 virus that causes COVID-19. Institutional protocols and  algorithms that pertain to the evaluation of patients at risk for COVID-19 are in a state of rapid change based on information released by regulatory bodies including the CDC and  federal and state organizations. These policies and algorithms were followed during the patient's care.

## 2020-10-06 NOTE — Progress Notes (Signed)
Pt refused the tx today (duoneb) he states that the treatment given to him this morning by RT made his HR increased to 150-160 and he feels like it is due to the neb treatments. He refused the tx for now. He states he takes inhalers at home when needed. Pt is not in any distress. RT will continue to monitor.

## 2020-10-06 NOTE — Telephone Encounter (Signed)
-----   Message from Pamella Pert, [place] sent at 10/06/2020  8:38 AM EDT ----- Regarding: appt this week Can you please call pt and make an appt this week f/u GT amputation first post op. Says that he has tried to call and make an appt and was having trouble. Can you please call? Thank you!

## 2020-10-06 NOTE — Telephone Encounter (Signed)
Called pt to make appt and he states he never had surgery. I confirmed it was the pt and I had the correct number listed on his chart.

## 2020-10-06 NOTE — Telephone Encounter (Signed)
ER doctor had sent message on this pt to Dr. Sharol Given I have sent oyu a message on the correct patient.

## 2020-10-06 NOTE — Consult Note (Addendum)
[facility]  [phone] Fax:([phone]) [place]  Reason for Referral: Metastatic squamous cell lung cancer, pancytopenia  HPI: Mr. 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  He presented to the emergency department with fever.  He has been not been having any URI symptoms, chest pain, or shortness of breath.  He has his baseline chronic cough.  On admission, the patient's WBC was 1.8, hemoglobin 11.8, platelets 175,000, ANC 0.6, sodium 129, creatinine 1.27, calcium 8.3, albumin 2.9.  UA negative.  A urine culture and blood cultures have been obtained and results are currently pending.  Chest x-ray showed progressive, dense consolidation within the right mid lung zone which may reflect progressive consolidative change in the setting of radiation pneumonitis, progressive changes of posttreatment fibrosis, or superimposed infection.  He had a CT of the chest performed this morning which has not yet been read. He has been started on IV antibiotics.  With regard to the patient's cancer history, he is currently receiving treatment with Keytruda 200 mg IV every 3 weeks.  Last dose was given on 09/30/2020.  He has had persistent neutropenia and anemia dating back to at least February 2022 which actually predates initiation of treatment for his lung cancer.  The patient was seen today in his hospital room.  His wife is at the bedside.  The patient and his wife tell me that he had 1 episode of diarrhea this past Saturday.  On Sunday, he had 2 episodes of vomiting and after this developed shaking chills and a fever above 101.  His wife brought him to the hospital.  He is unaware of any sick contacts.  His wife states that he has had some wheezing.  Currently, he is afebrile.  He is not currently having any chills.  He is not having any  headaches, dizziness, chest pain, palpitations, shortness of breath, abdominal pain, nausea, vomiting, diarrhea.  No bleeding reported.  Medical oncology was asked to see the patient make recommendations regarding his metastatic squamous cell lung cancer and pancytopenia.   Past Medical History:  Diagnosis Date   Dyspnea    Family history of adverse reaction to anesthesia    brother with seizures had episode under anesthesia.  Patient has seizures as well.   GERD (gastroesophageal reflux disease)    History of radiation therapy 04/24/2020-05/16/2020   IMRT to right lung     Dr [name]   PAF (paroxysmal atrial fibrillation) ([address])    CHADS2VSAC score 0   Rheumatoid aortitis    Squamous cell lung cancer ([place])   :   Past Surgical History:  Procedure Laterality Date   BRONCHIAL BRUSHINGS  03/27/2020   Procedure: BRONCHIAL BRUSHINGS;  Surgeon: [name], MD;  Location: [facility];  Service: Cardiopulmonary;;   CATARACT EXTRACTION  2016   at [facility]  03/27/2020   Procedure: FINE NEEDLE ASPIRATION;  Surgeon: [name], MD;  Location: [facility];  Service: Cardiopulmonary;;   HIP SURGERY Left    VIDEO BRONCHOSCOPY WITH ENDOBRONCHIAL ULTRASOUND N/A 03/27/2020   Procedure: VIDEO BRONCHOSCOPY WITH ENDOBRONCHIAL ULTRASOUND;  Surgeon: [name], MD;  Location: [place] [facility];  Service: Cardiopulmonary;  Laterality: N/A;  :   Current Facility-Administered Medications  Medication Dose Route Frequency Provider Last Rate Last Admin   0.9 %  sodium chloride infusion  Intravenous Continuous Kc, Maren Beach, MD       acetaminophen (TYLENOL) tablet 650 mg  650 mg Oral Q6H PRN Toy Baker, MD       Or   acetaminophen (TYLENOL) suppository 650 mg  650 mg Rectal Q6H PRN Doutova, Anastassia, MD       ceFEPIme (MAXIPIME) 2 g in sodium chloride 0.9 % 100 mL IVPB  2 g Intravenous Q8H Doutova, Anastassia, MD 200 mL/hr at 10/06/20 0539 2 g at 10/06/20 0539    enoxaparin (LOVENOX) injection 40 mg  40 mg Subcutaneous Q24H Doutova, Anastassia, MD       hydrocortisone sodium succinate (SOLU-CORTEF) 100 MG injection 100 mg  100 mg Intravenous Q8H Doutova, Anastassia, MD   100 mg at 10/06/20 0147   levalbuterol (XOPENEX) nebulizer solution 0.63 mg  0.63 mg Nebulization Q4H PRN Reubin Milan, MD       pantoprazole (PROTONIX) EC tablet 40 mg  40 mg Oral QHS Reubin Milan, MD   40 mg at 10/06/20 0148   vancomycin (VANCOREADY) IVPB 1500 mg/300 mL  1,500 mg Intravenous Q24H Doutova, Anastassia, MD         No Known Allergies:   Family History  Problem Relation Age of Onset   Arrhythmia Mother        has PPM   Heart disease Father        Died at 58, started in his 25s, heart attacks, had PPM and ICD  :   Social History   Socioeconomic History   Marital status: Married    Spouse name: Not on file   Number of children: Not on file   Years of education: Not on file   Highest education level: Not on file  Occupational History   Not on file  Tobacco Use   Smoking status: Former    Packs/day: 1.00    Years: 30.00    Pack years: 30.00    Types: Cigarettes    Quit date: 2018    Years since quitting: 4.6   Smokeless tobacco: Never  Vaping Use   Vaping Use: Never used  Substance and Sexual Activity   Alcohol use: Not Currently   Drug use: Never   Sexual activity: Not on file  Other Topics Concern   Not on file  Social History Narrative   Not on file   Social Determinants of Health   Financial Resource Strain: Not on file  Food Insecurity: Not on file  Transportation Needs: Not on file  Physical Activity: Not on file  Stress: Not on file  Social Connections: Not on file  Intimate Partner Violence: Not on file  :  Review of Systems: A comprehensive 14 point review of systems was negative except as noted in the HPI.  Exam: Patient Vitals for the past 24 hrs:  BP Temp Temp src Pulse Resp SpO2 Height Weight  10/06/20 0825  (!) 92/59 97.9 F (36.6 C) Oral 74 16 94 % -- --  10/06/20 0541 (!) 91/54 98 F (36.7 C) Oral 79 20 94 % -- --  10/06/20 0438 -- -- -- -- 18 -- -- --  10/06/20 0233 92/60 98.2 F (36.8 C) Oral 92 20 95 % -- --  10/06/20 0041 (!) 88/56 97.9 F (36.6 C) Oral 86 20 93 % -- --  10/05/20 2335 (!) 98/58 -- -- 93 (!) 30 93 % -- --  10/05/20 2330 (!) 90/57 -- -- 81 19 92 % -- --  10/05/20 2300 (!) 92/54 -- --  85 (!) 21 92 % -- --  10/05/20 2237 (!) 93/59 -- -- 96 20 95 % -- --  10/05/20 2220 -- 99.1 F (37.3 C) Oral -- -- -- -- --  10/05/20 2200 (!) 95/52 -- -- 94 (!) 23 94 % -- --  10/05/20 2146 (!) 99/57 -- -- 99 (!) 24 93 % -- --  10/05/20 2130 (!) 99/57 -- -- (!) 105 (!) 25 93 % -- --  10/05/20 2100 (!) 103/59 -- -- (!) 105 (!) 26 92 % -- --  10/05/20 2015 108/75 (!) 101.6 F (38.7 C) Oral (!) 111 (!) 26 94 % -- --  10/05/20 1959 96/67 (!) 101.8 F (38.8 C) Oral (!) 124 (!) 28 91 % -- --  10/05/20 1956 -- -- -- -- -- -- 5\' 10"  (1.778 m) 92.5 kg    General:  well-nourished in no acute distress.   Eyes:  no scleral icterus.   ENT:  There were no oropharyngeal lesions.   Lymphatics:  Negative cervical, supraclavicular or axillary adenopathy.   Respiratory: Wheezing in the right posterior lung fields, diminished on the left. Cardiovascular:  Regular rate and rhythm, S1/S2, without murmur, rub or gallop.  There was no pedal edema.   GI:  abdomen was soft, flat, nontender, nondistended, without organomegaly.   Musculoskeletal: Strength symmetrical in the upper and lower extremities. Skin exam was without echymosis, petichae.   Neuro exam was nonfocal. Patient was alert and oriented.  Attention was good.   Language was appropriate.  Mood was normal without depression.  Speech was not pressured.  Thought content was not tangential.     Lab Results  Component Value Date   WBC 1.4 (LL) 10/06/2020   HGB 9.3 (L) 10/06/2020   HCT 28.3 (L) 10/06/2020   PLT 132 (L) 10/06/2020   GLUCOSE 118  (H) 10/06/2020   ALT 23 10/06/2020   AST 27 10/06/2020   NA 133 (L) 10/06/2020   K 3.8 10/06/2020   CL 102 10/06/2020   CREATININE 1.01 10/06/2020   BUN 14 10/06/2020   CO2 25 10/06/2020    DG Chest Port 1 View  Result Date: 10/05/2020 CLINICAL DATA:  Sepsis, lung cancer EXAM: PORTABLE CHEST 1 VIEW COMPARISON:  05/12/2020, CT 07/18/2020 FINDINGS: There is dense consolidation within the right mid lung zone, progressive when compared to prior CT examination, with associated right-sided volume loss. Left lung is clear. No pneumothorax or pleural effusion. Cardiac size is within normal limits. No acute bone abnormality. IMPRESSION: Progressive, dense consolidation within the right mid lung zone. This may reflect progressive consolidative change in the setting of radiation pneumonitis,, progressive changes of post treatment fibrosis, or superimposed infection. Electronically Signed   By: Fidela Salisbury M.D.   On: 10/05/2020 21:04     DG Chest Port 1 View  Result Date: 10/05/2020 CLINICAL DATA:  Sepsis, lung cancer EXAM: PORTABLE CHEST 1 VIEW COMPARISON:  05/12/2020, CT 07/18/2020 FINDINGS: There is dense consolidation within the right mid lung zone, progressive when compared to prior CT examination, with associated right-sided volume loss. Left lung is clear. No pneumothorax or pleural effusion. Cardiac size is within normal limits. No acute bone abnormality. IMPRESSION: Progressive, dense consolidation within the right mid lung zone. This may reflect progressive consolidative change in the setting of radiation pneumonitis,, progressive changes of post treatment fibrosis, or superimposed infection. Electronically Signed   By: Fidela Salisbury M.D.   On: 10/05/2020 21:04     Assessment and Plan:  1.  Stage IV squamous cell non-small cell lung cancer 2.  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  Sepsis/neutropenic fever 4.  Hyponatremia 5.  Hypomagnesemia 6.  COPD with asthma 7.  Paroxysmal  atrial fibrillation 8.  RA  -Mr. Fendley is currently receiving immunotherapy for his stage IV lung cancer.  He has been tolerating treatment fairly well.  He will be reevaluated as an outpatient for continuation of treatment.  He had a CT scan of the chest performed earlier today both for restaging purposes and to further evaluate abnormal chest x-ray findings.  We will follow-up on these results.  Abnormal chest x-ray findings could also be a component of pneumonitis and may need to consider steroids for treatment if pneumonitis is found. -He has had persistent pancytopenia even predating treatment for his lung cancer.  The pancytopenia is not being caused by his immunotherapy.  We had a discussion today about the use of Granix injections which he is not terribly keen on taking.  However, in the setting of fever, he may need to administer this.  He is agreeable to taking what ever [name] feels is best for him.  Will defer decision about adding Granix to [name].  Hemoglobin is down slightly today likely due to hemodilution from IV fluids.  Monitor his CBC closely.  No transfusion is needed at this time. -Agree with continuation of broad-spectrum antibiotics while awaiting urine and blood cultures.  Again, we will follow-up on CT scan results to evaluate for possible infection. -Treatment of hyponatremia, hypomagnesemia, and chronic medical conditions per hospitalist.  Mikey Bussing, DNP, AGPCNP-BC, AOCNP   ADDENDUM: I saw and examined Mr. Zielinski this morning.  I just saw him last week.  He got his immunotherapy last week.  He is neutropenic.  This is not from his immunotherapy.  I am sure this is from some underlying myelodysplasia that he may have.  He does have some splenomegaly.  As such he may have some splenic sequestration.  I looked at his CT scan.  It is hard to say if these lymph nodes are reactive to this infection or if they are reflective of metastatic disease.  I do think that  with Neupogen would be a good idea right now.  I will see no reason why we should not use Neupogen.  Cultures are not yet back.  He looked quite good.  He felt okay.  He was afebrile.  I know this is complicated.  He has a metastatic lung cancer.  He has had treatment for this.  He is on immunotherapy for this.  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  Again I think that Neupogen would be a reasonable option to try to get his white cell count up a little bit.  He is on broad-spectrum antibiotics which is a good idea.  I know that he is getting outstanding care from the staff on [address].  I do appreciate all their help.  We will clearly follow along and do what we can to try to help and try to get him out of the hospital as soon as possible.  [name], MD  Deuteronomy 7:9

## 2020-10-06 NOTE — Progress Notes (Signed)
Patient is refusing his granix.  Sent message via answering service at the cancer center for medical oncology regarding patient refusal and also Dr. Lupita Leash is aware.

## 2020-10-06 NOTE — Progress Notes (Signed)
Pharmacy Antibiotic Note  John [name] is a 61 y.o. male admitted on 10/05/2020 with sepsis, febrile neutropenia.  Pharmacy has been consulted for Cefepime and vancomycin dosing.  WBC low, ANC 400, SCr improved to 1.01.  So far, patient has been afebrile today (last temp 8/21 PM).  CXR concerning for radiation pneumonitis or superimposed infection. Will also collect MRSA PCR.  Plan: -Continue cefepime 2 gm IV Q 8 hours  -Adjust vancomycin to 1250mg  IV q12 hours. Goal AUC 400-550. Expected AUC: 522 SCr used: 1.01 -Monitor CBC, renal fx, cultures and clinical progress -Vanc levels as indicated    Height: 5\' 10"  (177.8 cm) Weight: 92.5 kg (204 lb) IBW/kg (Calculated) : 73  Temp (24hrs), Avg:99 F (37.2 C), Min:97.8 F (36.6 C), Max:101.8 F (38.8 C)  Recent Labs  Lab 09/30/20 1343 10/05/20 2040 10/06/20 0046 10/06/20 0430  WBC 2.6* 1.8*  --  1.4*  CREATININE 1.09 1.27*  --  1.01  LATICACIDVEN  --  1.7 1.0  --      Estimated Creatinine Clearance: 90 mL/min (by C-G formula based on SCr of 1.01 mg/dL).    No Known Allergies  Antimicrobials this admission: Cefepime 8/21 >>  Vancomycin 8/21 >>   Dose adjustments this admission: 8/22 Vanc 1500mg  IV q24h >> vanc 1250mg  IV q12 hours  Microbiology results: 8/21 Bcx: sent 8/21 Ucx: sent 8/22 MRSA PCR: ordered  Thank you for allowing pharmacy to be a part of this patient's care.  Dimple Nanas, PharmD 10/06/2020 12:46 PM

## 2020-10-06 NOTE — Progress Notes (Signed)
CRITICAL VALUE STICKER  CRITICAL VALUE: WBC 1.4 / Absolute Neutrophil Count 0.4  RECEIVER (on-site recipient of call): P. Zenia Resides, RN  DATE & TIME NOTIFIED: 10/06/20 0507  MESSENGER (representative from lab): Brendolyn Patty  MD NOTIFIED: X. Blount, NP  TIME OF NOTIFICATION: 0508  RESPONSE:  no new orders received

## 2020-10-07 DIAGNOSIS — M069 Rheumatoid arthritis, unspecified: Secondary | ICD-10-CM | POA: Diagnosis not present

## 2020-10-07 DIAGNOSIS — R599 Enlarged lymph nodes, unspecified: Secondary | ICD-10-CM

## 2020-10-07 DIAGNOSIS — R161 Splenomegaly, not elsewhere classified: Secondary | ICD-10-CM

## 2020-10-07 DIAGNOSIS — A419 Sepsis, unspecified organism: Secondary | ICD-10-CM | POA: Diagnosis not present

## 2020-10-07 DIAGNOSIS — B965 Pseudomonas (aeruginosa) (mallei) (pseudomallei) as the cause of diseases classified elsewhere: Secondary | ICD-10-CM

## 2020-10-07 DIAGNOSIS — A4152 Sepsis due to Pseudomonas: Secondary | ICD-10-CM | POA: Diagnosis not present

## 2020-10-07 DIAGNOSIS — C3490 Malignant neoplasm of unspecified part of unspecified bronchus or lung: Secondary | ICD-10-CM

## 2020-10-07 DIAGNOSIS — R7881 Bacteremia: Secondary | ICD-10-CM | POA: Diagnosis not present

## 2020-10-07 DIAGNOSIS — I480 Paroxysmal atrial fibrillation: Secondary | ICD-10-CM | POA: Diagnosis not present

## 2020-10-07 DIAGNOSIS — D709 Neutropenia, unspecified: Secondary | ICD-10-CM | POA: Diagnosis not present

## 2020-10-07 DIAGNOSIS — R911 Solitary pulmonary nodule: Secondary | ICD-10-CM

## 2020-10-07 LAB — COMPREHENSIVE METABOLIC PANEL
ALT: 21 U/L (ref 0–44)
AST: 19 U/L (ref 15–41)
Albumin: 2.3 g/dL — ABNORMAL LOW (ref 3.5–5.0)
Alkaline Phosphatase: 46 U/L (ref 38–126)
Anion gap: 7 (ref 5–15)
BUN: 17 mg/dL (ref 6–20)
CO2: 22 mmol/L (ref 22–32)
Calcium: 8 mg/dL — ABNORMAL LOW (ref 8.9–10.3)
Chloride: 109 mmol/L (ref 98–111)
Creatinine, Ser: 1.12 mg/dL (ref 0.61–1.24)
GFR, Estimated: >60 mL/min (ref >60)
Glucose, Bld: 153 mg/dL — ABNORMAL HIGH (ref 70–99)
Potassium: 3.6 mmol/L (ref 3.5–5.1)
Sodium: 138 mmol/L (ref 135–145)
Total Bilirubin: 0.8 mg/dL (ref 0.3–1.2)
Total Protein: 5.5 g/dL — ABNORMAL LOW (ref 6.5–8.1)

## 2020-10-07 LAB — CBC
HCT: 27.2 % — ABNORMAL LOW (ref 39.0–52.0)
Hemoglobin: 9 g/dL — ABNORMAL LOW (ref 13.0–17.0)
MCH: 29.5 pg (ref 26.0–34.0)
MCHC: 33.1 g/dL (ref 30.0–36.0)
MCV: 89.2 fL (ref 80.0–100.0)
Platelets: 160 10*3/uL (ref 150–400)
RBC: 3.05 MIL/uL — ABNORMAL LOW (ref 4.22–5.81)
RDW: 16.5 % — ABNORMAL HIGH (ref 11.5–15.5)
WBC: 1.3 10*3/uL — CL (ref 4.0–10.5)
nRBC: 0 % (ref 0.0–0.2)

## 2020-10-07 LAB — BLOOD CULTURE ID PANEL (REFLEXED) - BCID2

## 2020-10-07 LAB — LEGIONELLA PNEUMOPHILA SEROGP 1 UR AG: L. pneumophila Serogp 1 Ur Ag: NEGATIVE

## 2020-10-07 LAB — URINE CULTURE: Culture: NO GROWTH

## 2020-10-07 LAB — MAGNESIUM: Magnesium: 2.1 mg/dL (ref 1.7–2.4)

## 2020-10-07 LAB — PROCALCITONIN: Procalcitonin: 5.34 ng/mL

## 2020-10-07 LAB — GLUCOSE, CAPILLARY: Glucose-Capillary: 134 mg/dL — ABNORMAL HIGH (ref 70–99)

## 2020-10-07 MED ORDER — LEVALBUTEROL HCL 0.63 MG/3ML IN NEBU
0.6300 mg | INHALATION_SOLUTION | Freq: Two times a day (BID) | RESPIRATORY_TRACT | Status: DC
  Administered 2020-10-07 – 2020-10-09 (×5): 0.63 mg via RESPIRATORY_TRACT
  Filled 2020-10-07 (×5): qty 3

## 2020-10-07 MED ORDER — VANCOMYCIN HCL 125 MG PO CAPS
125.0000 mg | ORAL_CAPSULE | Freq: Two times a day (BID) | ORAL | Status: DC
  Administered 2020-10-07 – 2020-10-08 (×3): 125 mg via ORAL
  Filled 2020-10-07 (×4): qty 1

## 2020-10-07 MED ORDER — HYDROCORTISONE NA SUCCINATE PF 100 MG IJ SOLR
100.0000 mg | Freq: Two times a day (BID) | INTRAMUSCULAR | Status: DC
  Administered 2020-10-07 – 2020-10-08 (×4): 100 mg via INTRAVENOUS
  Filled 2020-10-07 (×4): qty 2

## 2020-10-07 MED ORDER — PAROXETINE HCL 10 MG PO TABS
10.0000 mg | ORAL_TABLET | Freq: Every day | ORAL | Status: AC
  Administered 2020-10-07: 10 mg via ORAL
  Filled 2020-10-07: qty 1

## 2020-10-07 NOTE — Progress Notes (Signed)
Overall, John [name] is feeling a little bit better.  He is growing Pseudomonas in his blood.  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  He is on Maxipime for the Pseudomonas.  We will have to see what the sensitivities are and hopefully he can go on something orally.  He does not want to have the Neupogen.  He apparently had a reaction to it when he was in the hospital a few months ago.  I will have to see if that truly is a case.  I am sure that the neutropenia is a real issue.  Again I am not  sure why there is neutropenia.  I does wonder if we do not need to do a bone marrow biopsy on him.  He also is in atrial fibrillation.  He is not on anticoagulation as far as I can tell.  I think that he would be considered a significant risk for thromboembolic disease because of his atrial fibrillation also because of the cancer.  As such, I think he probably needs to be on anticoagulation.  His labs today show white count of 1.3.  Hemoglobin 9.  Platelet count 160,000.  His BUN is 17 creatinine 1.12.  He is not having any problems with diarrhea.  There is no cough..  Did have a chest CT scan done on 10/06/2020.  There is some increasingly conspicuous lymph nodes in the chest and lower right cervical and supraclavicular stations.  Again this could be reactive.  He has a stable nodule in the right upper lung.  Right adrenal met is stable.  There is some splenomegaly.  As such, I really think that everything looks pretty stable with respect to his cancer.  Again I worry about the atrial fibrillation.  I do think he needs to be on anticoagulation.  I will see any reason why cannot be on anticoagulation.  We will have to see about the potential of doing a bone marrow biopsy on him to see about the neutropenia.  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  He is eating pretty well.  He does not have any problems with nausea or vomiting.  The staff on [address]  are doing a great job with him.  I appreciate all their hard work.  John [name] , MD  Hebrews 12:12 

## 2020-10-07 NOTE — Progress Notes (Signed)
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 fib not on anticoagulation due to low score, GERD, history of CAD, on Toprol admitted with With neutropenic fever, sepsis with SIRS criteria   In the ED febrile 100.1, hyponatremic neutropenic.  Placed on vancomycin/cefepime. Chest x-ray nonacute de-escalation could be radiation pneumonitis versus superimposed infection, normal lactic acid, UA no evidence of UTI, COVID-19 negative. CT chest was pending and came back as: 1. Increasingly conspicuous lymph nodes in the right hilar, mediastinal, lower right cervical and supraclavicular nodal stations concerning for potential metastatic disease, although some of this could be simply reactive in the setting of evolving postradiation changes in the right lung. Attention on follow-up studies is recommended. Treated right upper lobe lesion is once again cavitary and less apparent than on the prior examination. 2. Satellite nodule measuring 11 mm in the right upper lobe, stable compared to the prior study. 3. Right adrenal metastasis, similar to the prior study. 4. 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 5. Cholelithiasis. 6. Aortic atherosclerosis, in addition to 2 vessel coronary artery disease.   Subjective: Seen and examined this morning.  Resting comfortably.  No fever overnight. Denies any loose stool, cough fever chills or abdominal pain.  Assessment & Plan:  Neutropenic fever Pseudomonas sepsis  POA Immunosuppressed in the setting of immunotherapy, long-term prednisone use for RA No respiratory symptoms or urinary symptoms.  Procalcitonin  elevated 7.9 normal lactic acid.  BP soft on admission.  Now blood pressure stable.  Has neutropenia but afebrile.  BC ID Pseudomonas detected. . Cont with broad-spectrum antibiotics with cefepime/vancomycin.  We will consult infectious disease.  Oncology following  Neutropenia with anemia, thrombocytopenia and neutropenia: Not due to immunotherapy per oncology.  He has predated pancytopenia prior to his immunotherapy likely chronic component.  Drop suspected due to his sepsis.  WBC downtrending.  He refused Granix 8/22 evening stating it made him "very sick" on previous administrations. Suspect his anemia contributed by hemodilution.  ?  Felty syndrome/?Bm biopsy  need-will defer to hematology Recent Labs  Lab 10/05/20 2040 10/06/20 0430 10/07/20 0443  HGB 11.8* 9.3* 9.0*  HCT 34.7* 28.3* 27.2*  WBC 1.8* 1.4* 1.3*  PLT 175 132* 160    Hypomagnesemia was repleted.    Hypotension-continue IV fluids.  It has been soft on admission.   Hyponatremia: Suspect volume depletion dehydration / improved Recent Labs  Lab 09/30/20 1343 10/05/20 2040 10/06/20 0430 10/07/20 0443  NA 136 129* 133* 138    RA on long-term prednisone-now on stress dose hydrocortisone- transition to q12 and hopefully to po prednisone soon.  SCC stage IV lung SCC with adrenal metastasis on immunotherapy-Dr Ennever notified/following.  CT chest shows lymph nodes in the right hilar mediastinal nor right cervical and supraclavicular node-?  Potentially metastatic disease versus reactive lymph nodes.  Oncology following closely.  PAF with RVR- needing IV metoprolol.  Metoprolol resumed at lower dose and uptitrate as blood pressure improves. CHADS2VASC2 score is 0 so does not need anticoagulation for a fib stand point. 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 Likely  false positive  Diet Order             Diet Heart Room service appropriate? Yes; Fluid consistency: Thin  Diet effective now                 Patient's Body mass index is 29.27 kg/m. DVT prophylaxis: enoxaparin (LOVENOX) injection 40 mg Start: 10/06/20 1000 Code Status:   Code Status: Full Code  Family Communication: plan of care discussed with patient at bedside. Status is: Inpatient Remains inpatient appropriate because:IV treatments appropriate due to intensity of illness or inability to take PO and Inpatient level of care appropriate due to severity of illness Dispo: The patient is from: Home              Anticipated d/c is to: Home              Patient currently is not medically stable to d/c.   Difficult to place patient No Unresulted Labs (From admission, onward)     Start     Ordered   10/07/20 0500  CBC  Daily,   R     Question:  Specimen collection method  Answer:  Lab=Lab collect   10/06/20 0805   10/07/20 0500  Comprehensive metabolic panel  Daily,   R     Question:  Specimen collection method  Answer:  Lab=Lab collect   10/06/20 0805   10/06/20 0731  MRSA Next Gen by PCR, Nasal  Once,   R        10/06/20 0730   10/06/20 0139  HIV-1/2 AB - differentiation  Once,   AD        10/06/20 0139   10/06/20 0114  Expectorated Sputum Assessment w Gram Stain, Rflx to Resp Cult  Once,   R        10/06/20 0113   10/06/20 0114  Legionella Pneumophila Serogp 1 Ur Ag  Once,   R        10/06/20 0113   10/05/20 2017  Blood Culture (routine x 2)  (Septic presentation on arrival (screening labs, nursing and treatment orders for obvious sepsis))  BLOOD CULTURE X 2,   STAT      10/05/20 2017          Medications reviewed:  Scheduled Meds:  enoxaparin (LOVENOX) injection  40 mg Subcutaneous Q24H   hydrocortisone sod succinate (SOLU-CORTEF) inj  100 mg Intravenous Q8H   ipratropium-albuterol  3 mL Nebulization BID   metoprolol tartrate  12.5 mg Oral BID   pantoprazole  40 mg  Oral QHS   Continuous Infusions:  sodium chloride 125 mL/hr at 10/07/20 0108   ceFEPime (MAXIPIME) IV 2 g (10/07/20 8341)   vancomycin 1,250 mg (10/07/20 0101)   Consultants:see note  Procedures:see note Antimicrobials: Anti-infectives (From admission, onward)    Start     Dose/Rate Route Frequency Ordered Stop   10/06/20 2000  vancomycin (VANCOREADY) IVPB 1500 mg/300 mL  Status:  Discontinued        1,500 mg 150 mL/hr over 120 Minutes Intravenous Every 24 hours 10/05/20 2149 10/06/20 1046   10/06/20 1200  vancomycin (VANCOREADY) IVPB 1250 mg/250 mL        1,250 mg 166.7 mL/hr over 90 Minutes Intravenous Every 12 hours 10/06/20 1046     10/06/20 0600  ceFEPIme (MAXIPIME) 2 g in sodium chloride 0.9 % 100 mL IVPB  2 g 200 mL/hr over 30 Minutes Intravenous Every 8 hours 10/05/20 2149     10/05/20 2030  ceFEPIme (MAXIPIME) 2 g in sodium chloride 0.9 % 100 mL IVPB        2 g 200 mL/hr over 30 Minutes Intravenous  Once 10/05/20 2017 10/05/20 2216   10/05/20 2030  vancomycin (VANCOCIN) IVPB 1000 mg/200 mL premix  Status:  Discontinued        1,000 mg 200 mL/hr over 60 Minutes Intravenous  Once 10/05/20 2017 10/05/20 2029   10/05/20 2030  vancomycin (VANCOCIN) IVPB 1000 mg/200 mL premix        1,000 mg 200 mL/hr over 60 Minutes Intravenous Every hour 10/05/20 2029 10/05/20 2319      Culture/Microbiology    Component Value Date/Time   SDES  10/05/2020 2215    IN/OUT CATH URINE Performed at [facility], [address] [place], [place] [address]    [facility]  10/05/2020 2215    NONE Performed at [facility], [address], [place], [place] [address]    CULT  10/05/2020 2215    NO GROWTH Performed at [facility], [place] [address], [place], [place] [address]    REPTSTATUS 10/07/2020 FINAL 10/05/2020 2215    Other culture-see note  Objective: Vitals: Today's Vitals   10/06/20 1751 10/06/20 2059 10/06/20 2130 10/07/20 0112  BP: 106/75  102/60  98/62  Pulse: 98 67  98  Resp: 20 16  18   Temp: 97.9 F (36.6 C) 98 F (36.7 C)  97.7 F (36.5 C)  TempSrc: Oral Oral  Oral  SpO2: 97% 94%  95%  Weight:      Height:      PainSc:   0-No pain     Intake/Output Summary (Last 24 hours) at 10/07/2020 0840 Last data filed at 10/06/2020 2200 Gross per 24 hour  Intake 2841.13 ml  Output --  Net 2841.13 ml   Filed Weights   10/05/20 1956  Weight: 92.5 kg   Weight change:   Intake/Output from previous day: 08/22 0701 - 08/23 0700 In: 2841.1 [P.O.:740; I.V.:653.8; IV Piggyback:1447.3] Out: -  Intake/Output this shift: No intake/output data recorded. Filed Weights   10/05/20 1956  Weight: 92.5 kg   Examination: General exam: AAO x3, pleasant,. HEENT:Oral mucosa moist, Ear/Nose WNL grossly,dentition normal. Respiratory system: bilaterally mild expiratory wheezing present, no use of accessory muscle, non tender. Cardiovascular system: S1 & S2 +,irregular, No JVD. Gastrointestinal system: Abdomen soft, NT,ND, BS+. Nervous System:Alert, awake, moving extremities Extremities: no edema, distal peripheral pulses palpable.  Skin: No rashes,no icterus. MSK: Normal muscle bulk,tone, power Data Reviewed: I have personally reviewed following labs and imaging studies CBC: Recent Labs  Lab 09/30/20 1343 10/05/20 2040 10/06/20 0430 10/07/20 0443  WBC 2.6* 1.8* 1.4* 1.3*  NEUTROABS 0.2* 0.6* 0.4*  --   HGB 11.4* 11.8* 9.3* 9.0*  HCT 34.3* 34.7* 28.3* 27.2*  MCV 88.6 87.2 89.6 89.2  PLT 203 175 132* 151   Basic Metabolic Panel: Recent Labs  Lab 09/30/20 1343 10/05/20 2040 10/06/20 0139 10/06/20 0430 10/07/20 0443  NA 136 129*  --  133* 138  K 3.8 3.9  --  3.8 3.6  CL 102 95*  --  102 109  CO2 26 25  --  25 22  GLUCOSE 101* 115*  --  118* 153*  BUN 17 14  --  14 17  CREATININE 1.09 1.27*  --  1.01 1.12  CALCIUM 8.8* 8.3*  --  7.9* 8.0*  MG  --   --  1.5* 1.5* 2.1  PHOS  --   --  3.9  --   --    GFR: Estimated  Creatinine Clearance: 81.2 mL/min (by C-G formula based on SCr of 1.12 mg/dL). Liver Function Tests: Recent Labs  Lab 09/30/20 1343 10/05/20 2040 10/06/20 0430 10/07/20 0443  AST 32 36 27 19  ALT 23 26 23 21   ALKPHOS 68 74 53 46  BILITOT 0.6 1.3* 1.3* 0.8  PROT 6.0* 6.8 5.7* 5.5*  ALBUMIN 3.1* 2.9* 2.4* 2.3*   No results for input(s): LIPASE, AMYLASE in the last 168 hours. No results for input(s): AMMONIA in the last 168 hours. Coagulation Profile: Recent Labs  Lab 10/05/20 2040  INR 1.1   Cardiac Enzymes: No results for input(s): CKTOTAL, CKMB, CKMBINDEX, TROPONINI in the last 168 hours. BNP (last 3 results) No results for input(s): PROBNP in the last 8760 hours. HbA1C: No results for input(s): HGBA1C in the last 72 hours. CBG: No results for input(s): GLUCAP in the last 168 hours. Lipid Profile: No results for input(s): CHOL, HDL, LDLCALC, TRIG, CHOLHDL, LDLDIRECT in the last 72 hours. Thyroid Function Tests: Recent Labs    10/06/20 0147  TSH 2.992   Anemia Panel: No results for input(s): VITAMINB12, FOLATE, FERRITIN, TIBC, IRON, RETICCTPCT in the last 72 hours. Sepsis Labs: Recent Labs  Lab 10/05/20 2040 10/06/20 0046 10/06/20 0139 10/07/20 0443  PROCALCITON  --   --  7.93 5.34  LATICACIDVEN 1.7 1.0  --   --     Recent Results (from the past 240 hour(s))  Blood Culture (routine x 2)     Status: None (Preliminary result)   Collection Time: 10/05/20  8:17 PM   Specimen: Right Antecubital; Blood  Result Value Ref Range Status   Specimen Description   Final    RIGHT ANTECUBITAL Performed at [facility], [address] [place], [place] [address]    Special Requests   Final    BOTTLES DRAWN AEROBIC AND ANAEROBIC Blood Culture adequate volume Performed at [facility], [address] [place], [place] [address]    Culture  Setup Time   Final    GRAM NEGATIVE RODS AEROBIC BOTTLE ONLY CRITICAL RESULT CALLED TO, READ BACK BY AND  VERIFIED WITH: E JACKSON,[id number]  10/06/21 [place] Performed at [facility], [place] [address], [place], [place] [address]    Culture GRAM NEGATIVE RODS  Final   Report Status PENDING  Incomplete  Blood Culture ID Panel (Reflexed)     Status: Abnormal   Collection Time: 10/05/20  8:17 PM  Result Value Ref Range Status   Enterococcus faecalis NOT DETECTED NOT DETECTED Final   Enterococcus Faecium NOT DETECTED NOT DETECTED Final   Listeria monocytogenes NOT DETECTED NOT DETECTED Final   Staphylococcus species NOT DETECTED NOT DETECTED Final   Staphylococcus aureus (BCID) NOT DETECTED NOT DETECTED Final   Staphylococcus epidermidis NOT DETECTED NOT DETECTED Final   Staphylococcus lugdunensis NOT DETECTED NOT DETECTED Final   Streptococcus species NOT DETECTED NOT DETECTED Final   Streptococcus agalactiae NOT DETECTED NOT DETECTED Final   Streptococcus pneumoniae NOT DETECTED NOT DETECTED Final   Streptococcus pyogenes NOT DETECTED NOT DETECTED Final   A.calcoaceticus-baumannii NOT DETECTED NOT DETECTED Final   Bacteroides fragilis NOT DETECTED NOT DETECTED Final   Enterobacterales NOT DETECTED NOT DETECTED Final   Enterobacter cloacae complex NOT DETECTED NOT DETECTED  Final   Escherichia coli NOT DETECTED NOT DETECTED Final   Klebsiella aerogenes NOT DETECTED NOT DETECTED Final   Klebsiella oxytoca NOT DETECTED NOT DETECTED Final   Klebsiella pneumoniae NOT DETECTED NOT DETECTED Final   Proteus species NOT DETECTED NOT DETECTED Final   Salmonella species NOT DETECTED NOT DETECTED Final   Serratia marcescens NOT DETECTED NOT DETECTED Final   Haemophilus influenzae NOT DETECTED NOT DETECTED Final   Neisseria meningitidis NOT DETECTED NOT DETECTED Final   Pseudomonas aeruginosa DETECTED (A) NOT DETECTED Final    Comment: CRITICAL RESULT CALLED TO, READ BACK BY AND VERIFIED WITH: E JACKSON,[id number]  10/07/20 [place]    Stenotrophomonas maltophilia NOT DETECTED NOT DETECTED Final   Candida  albicans NOT DETECTED NOT DETECTED Final   Candida auris NOT DETECTED NOT DETECTED Final   Candida glabrata NOT DETECTED NOT DETECTED Final   Candida krusei NOT DETECTED NOT DETECTED Final   Candida parapsilosis NOT DETECTED NOT DETECTED Final   Candida tropicalis NOT DETECTED NOT DETECTED Final   Cryptococcus neoformans/gattii NOT DETECTED NOT DETECTED Final   CTX-M ESBL NOT DETECTED NOT DETECTED Final   Carbapenem resistance IMP NOT DETECTED NOT DETECTED Final   Carbapenem resistance KPC NOT DETECTED NOT DETECTED Final   Carbapenem resistance NDM NOT DETECTED NOT DETECTED Final   Carbapenem resistance VIM NOT DETECTED NOT DETECTED Final    Comment: Performed at [facility], [address] [place], [place] [address]  Resp Panel by RT-PCR (Flu A&B, Covid) Nasopharyngeal Swab     Status: None   Collection Time: 10/05/20  8:40 PM   Specimen: Nasopharyngeal Swab; Nasopharyngeal(NP) swabs in vial transport medium  Result Value Ref Range Status   SARS Coronavirus 2 by RT PCR NEGATIVE NEGATIVE Final    Comment: (NOTE) SARS-CoV-2 target nucleic acids are NOT DETECTED.  The SARS-CoV-2 RNA is generally detectable in upper respiratory specimens during the acute phase of infection. The lowest concentration of SARS-CoV-2 viral copies this assay can detect is 138 copies/mL. A negative result does not preclude SARS-Cov-2 infection and should not be used as the sole basis for treatment or other patient management decisions. A negative result may occur with  improper specimen collection/handling, submission of specimen other than nasopharyngeal swab, presence of viral mutation(s) within the areas targeted by this assay, and inadequate number of viral copies(<138 copies/mL). A negative result must be combined with clinical observations, patient history, and epidemiological information. The expected result is Negative.  Fact Sheet for Patients:   EntrepreneurPulse.com.au  Fact Sheet for Healthcare Providers:  IncredibleEmployment.be  This test is no t yet approved or cleared by the [place] FDA and  has been authorized for detection and/or diagnosis of SARS-CoV-2 by FDA under an Emergency Use Authorization (EUA). This EUA will remain  in effect (meaning this test can be used) for the duration of the COVID-19 declaration under Section 564(b)(1) of the Act, 21 U.S.C.section 360bbb-3(b)(1), unless the authorization is terminated  or revoked sooner.       Influenza A by PCR NEGATIVE NEGATIVE Final   Influenza B by PCR NEGATIVE NEGATIVE Final    Comment: (NOTE) The Xpert Xpress SARS-CoV-2/FLU/RSV plus assay is intended as an aid in the diagnosis of influenza from Nasopharyngeal swab specimens and should not be used as a sole basis for treatment. Nasal washings and aspirates are unacceptable for Xpert Xpress SARS-CoV-2/FLU/RSV testing.  Fact Sheet for Patients: EntrepreneurPulse.com.au  Fact Sheet for Healthcare Providers: IncredibleEmployment.be  This test is not yet approved or  cleared by the [place] and has been authorized for detection and/or diagnosis of SARS-CoV-2 by FDA under an Emergency Use Authorization (EUA). This EUA will remain in effect (meaning this test can be used) for the duration of the COVID-19 declaration under Section 564(b)(1) of the Act, 21 U.S.C. section 360bbb-3(b)(1), unless the authorization is terminated or revoked.  Performed at [facility], [address] [place], [place] [address]   Urine Culture     Status: None   Collection Time: 10/05/20 10:15 PM   Specimen: In/Out Cath Urine  Result Value Ref Range Status   Specimen Description   Final    IN/OUT CATH URINE Performed at [facility], [address] [place], [place] [address]    Special Requests   Final    NONE Performed  at [facility], [address] [place], [place] [address]    Culture   Final    NO GROWTH Performed at [facility], [place] [address], [place], [place] [address]    Report Status 10/07/2020 FINAL  Final     Radiology Studies: CT CHEST W CONTRAST  Result Date: 10/06/2020 CLINICAL DATA:  60 year old male with history of non-small cell lung cancer. Evaluate for treatment response. EXAM: CT CHEST WITH CONTRAST TECHNIQUE: Multidetector CT imaging of the chest was performed during intravenous contrast administration. CONTRAST:  43mL OMNIPAQUE IOHEXOL 350 MG/ML SOLN COMPARISON:  Chest CT 07/18/2020. FINDINGS: Cardiovascular: Heart size is normal. There is no significant pericardial fluid, thickening or pericardial calcification. There is aortic atherosclerosis, as well as atherosclerosis of the great vessels of the mediastinum and the coronary arteries, including calcified atherosclerotic plaque in the left anterior descending and left circumflex coronary arteries. Mediastinum/Nodes: Multiple prominent borderline enlarged and mildly enlarged mediastinal and right hilar lymph nodes are noted, largest of which is in the subcarinal nodal station measuring 1.5 cm in short axis. There also multiple prominent but nonenlarged low right cervical and right supraclavicular lymph nodes, conspicuous by their number, which appear slightly increased compared to the prior examination. Esophagus is unremarkable in appearance. No axillary lymphadenopathy. Lungs/Pleura: Treated mass in the posterior aspect of the right upper lobe is largely obscured, now a more subtle area of cavitation (axial image 56 of series 5), currently measuring 3.9 x 2.6 cm. Extensive surrounding consolidative changes with associated ground-glass attenuation and septal thickening in the adjacent portions of the right lung, which likely reflect evolving postradiation changes. Satellite nodule in the right upper lobe (axial image 40 of  series 5) measuring 11 mm, similar to the prior examination. Scattered areas of septal thickening are noted elsewhere in the lungs bilaterally. Mild diffuse bronchial wall thickening with mild centrilobular and paraseptal emphysema. Increasing pleural thickening and trace volume of pleural fluid in the right hemithorax. No left pleural effusion. Upper Abdomen: Large right adrenal mass again noted, currently measuring 4.7 x 3.3 cm, compatible with a metastatic lesion. Spleen is incompletely imaged, but appears likely enlarged measuring up to 15.0 x 8.5 cm on axial images. Again noted is a wedge-shaped area of low attenuation in the lateral aspect of the lower spleen, most compatible with an old splenic infarct. Calcified gallstone measuring 1.9 cm in the neck of the gallbladder. Musculoskeletal: There are no aggressive appearing lytic or blastic lesions noted in the visualized portions of the skeleton. IMPRESSION: 1. Increasingly conspicuous lymph nodes in the right hilar, mediastinal, lower right cervical and supraclavicular nodal stations concerning for potential  metastatic disease, although some of this could be simply reactive in the setting of evolving postradiation changes in the right lung. Attention on follow-up studies is recommended. Treated right upper lobe lesion is once again cavitary and less apparent than on the prior examination. 2. Satellite nodule measuring 11 mm in the right upper lobe, stable compared to the prior study. 3. Right adrenal metastasis, similar to the prior study. 4. 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 5. Cholelithiasis. 6. Aortic atherosclerosis, in addition to 2 vessel coronary artery disease. Please note that although the presence of coronary artery calcium documents the presence of coronary artery disease, the severity of this disease and any potential stenosis cannot be assessed on this non-gated CT examination. Assessment for potential risk factor modification,  dietary therapy or pharmacologic therapy may be warranted, if clinically indicated. Aortic Atherosclerosis (ICD10-I70.0). Electronically Signed   By: Vinnie Langton M.D.   On: 10/06/2020 09:45   DG Chest Port 1 View  Result Date: 10/05/2020 CLINICAL DATA:  Sepsis, lung cancer EXAM: PORTABLE CHEST 1 VIEW COMPARISON:  05/12/2020, CT 07/18/2020 FINDINGS: There is dense consolidation within the right mid lung zone, progressive when compared to prior CT examination, with associated right-sided volume loss. Left lung is clear. No pneumothorax or pleural effusion. Cardiac size is within normal limits. No acute bone abnormality. IMPRESSION: Progressive, dense consolidation within the right mid lung zone. This may reflect progressive consolidative change in the setting of radiation pneumonitis,, progressive changes of post treatment fibrosis, or superimposed infection. Electronically Signed   By: Fidela Salisbury M.D.   On: 10/05/2020 21:04     LOS: 1 day   Antonieta Pert, MD [facility]  10/07/2020, 8:40 AM

## 2020-10-07 NOTE — Progress Notes (Signed)
PHARMACY - PHYSICIAN COMMUNICATION CRITICAL VALUE ALERT - BLOOD CULTURE IDENTIFICATION (BCID)  John [name] is an 60 y.o. male who presented to [facility] on 10/05/2020 with a chief complaint of neutropenic fever, sepsis with SIRS criteria  Assessment:  1/4 aerobic bottle, pseudomonas, no resistance  Name of physician (or Provider) Contacted: Clarene Essex  Current antibiotics: cefepime  Changes to prescribed antibiotics recommended:  Patient is on recommended antibiotics - No changes needed  Results for orders placed or performed during the hospital encounter of 10/05/20  Blood Culture ID Panel (Reflexed) (Collected: 10/05/2020  8:17 PM)  Result Value Ref Range   Enterococcus faecalis NOT DETECTED NOT DETECTED   Enterococcus Faecium NOT DETECTED NOT DETECTED   Listeria monocytogenes NOT DETECTED NOT DETECTED   Staphylococcus species NOT DETECTED NOT DETECTED   Staphylococcus aureus (BCID) NOT DETECTED NOT DETECTED   Staphylococcus epidermidis NOT DETECTED NOT DETECTED   Staphylococcus lugdunensis NOT DETECTED NOT DETECTED   Streptococcus species NOT DETECTED NOT DETECTED   Streptococcus agalactiae NOT DETECTED NOT DETECTED   Streptococcus pneumoniae NOT DETECTED NOT DETECTED   Streptococcus pyogenes NOT DETECTED NOT DETECTED   A.calcoaceticus-baumannii NOT DETECTED NOT DETECTED   Bacteroides fragilis NOT DETECTED NOT DETECTED   Enterobacterales NOT DETECTED NOT DETECTED   Enterobacter cloacae complex NOT DETECTED NOT DETECTED   Escherichia coli NOT DETECTED NOT DETECTED   Klebsiella aerogenes NOT DETECTED NOT DETECTED   Klebsiella oxytoca NOT DETECTED NOT DETECTED   Klebsiella pneumoniae NOT DETECTED NOT DETECTED   Proteus species NOT DETECTED NOT DETECTED   Salmonella species NOT DETECTED NOT DETECTED   Serratia marcescens NOT DETECTED NOT DETECTED   Haemophilus influenzae NOT DETECTED NOT DETECTED   Neisseria meningitidis NOT DETECTED NOT DETECTED   Pseudomonas aeruginosa  DETECTED (A) NOT DETECTED   Stenotrophomonas maltophilia NOT DETECTED NOT DETECTED   Candida albicans NOT DETECTED NOT DETECTED   Candida auris NOT DETECTED NOT DETECTED   Candida glabrata NOT DETECTED NOT DETECTED   Candida krusei NOT DETECTED NOT DETECTED   Candida parapsilosis NOT DETECTED NOT DETECTED   Candida tropicalis NOT DETECTED NOT DETECTED   Cryptococcus neoformans/gattii NOT DETECTED NOT DETECTED   CTX-M ESBL NOT DETECTED NOT DETECTED   Carbapenem resistance IMP NOT DETECTED NOT DETECTED   Carbapenem resistance KPC NOT DETECTED NOT DETECTED   Carbapenem resistance NDM NOT DETECTED NOT DETECTED   Carbapenem resistance VIM NOT DETECTED NOT DETECTED    Dolly Rias RPh 10/07/2020, 3:18 AM

## 2020-10-07 NOTE — Consult Note (Addendum)
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  He has chronic mild neutropenia Reviewed chest ct rather extensive malignant involvement with some air-bronchogram changes. Suspect some degree of post-obstructive lung but no obvious s/s of pna. Pseudomonas likely from lung colonization. There is no central catheter.  He does have hx cdiff and high risk for recurrence especially with abx. Would place on secondary prophy with systemic abx.   For pseudomonas bacteremia. No obvious complication, and a 10 day course would be sufficient   Oncology is following for his lung cancer and chronic neutropenia of unclear etiology  Plan: Continue iv cefepime Stop vancomycin IV no evidence mrsa infection Follow up culture sensitivity If an oral agent is available can transition to it 2 days after fever defervesces and finishes total abx 10 days  No clear evidence benefit secondary cdiff prophy, but reasonable to try --> po vanc bid for the duration of systemic abx plus 1 week Discussed with primary team  I spent 60 minute reviewing data/chart, and coordinating care and >50% direct face to face time providing counseling/discussing diagnostics/treatment plan with patient      ------------------------------------------------ Active Problems:   COPD with asthma ([place])   Hyponatremia   Neutropenic fever ([place])   Bacteremia due to Pseudomonas   Paroxysmal A-fib ([place])   RA (rheumatoid arthritis) ([place])   Hypotension   Sepsis ([place])   Malignant neoplasm of lung ([place])    HPI: John [name] is a 60 y.o. male hx squmous lung cancer  stage iv (adrenal met), on pembolizumab, RA, copd, afib, hx cdiff, admitted for fever  Patient was treated for cdiff in 04/2020 developed when he was treated for ecoli bacteremia. Took 10 day dificid at that time  Followed by onc for neutropenia of unclear etiology and lung cancer. Has been on stable pembrolizumab. Chronic intermittent dry cough   He developed fever the day of admission and was worried so came for evaluation.  No new sx.  Feels well otherwise   On admisison did have temperature 101s Wbc 1.4 a little lower than baseline; mild thrombocytopenia Chest ct stable changes unclear if infectious changes present  Bcx ultimately returned with pseudomonas Started on bsAbx vanc/cefepime He was also placed on stress dose hydrocortisone  No diarrhea still on abx Continues to feel well Thrombocytopenia had resolved  Family History  Problem Relation Age of Onset   Arrhythmia Mother        has PPM   Heart disease Father        Died at 44, started in his 50s, heart attacks, had PPM and ICD    Social History   Tobacco Use   Smoking status: Former    Packs/day: 1.00    Years: 30.00    Pack years: 30.00    Types: Cigarettes    Quit date: 2018    Years since quitting: 4.6   Smokeless tobacco: Never  Vaping Use   Vaping Use: Never used  Substance Use Topics   Alcohol use: Not Currently   Drug use: Never    No Known Allergies  Review of Systems: ROS All Other ROS was negative, except mentioned above   Past Medical History:  Diagnosis Date   Dyspnea    Family history of adverse reaction to anesthesia    brother with seizures had episode under anesthesia.  Patient has seizures as well.   GERD (gastroesophageal reflux disease)    History of radiation therapy 04/24/2020-05/16/2020   IMRT to right lung     Dr [name]   PAF (paroxysmal atrial fibrillation) (HCC)    CHADS2VSAC score 0   Rheumatoid aortitis    Squamous cell lung cancer (HCC)        Scheduled  Meds:  enoxaparin (LOVENOX) injection  40 mg Subcutaneous Q24H   hydrocortisone sod succinate (SOLU-CORTEF) inj  100 mg Intravenous Q12H   ipratropium-albuterol  3 mL Nebulization BID   metoprolol tartrate  12.5 mg Oral BID   pantoprazole  40 mg Oral QHS   Continuous Infusions:  sodium chloride 125 mL/hr at 10/07/20 0952   ceFEPime (MAXIPIME) IV 2 g (10/07/20 1544)   PRN Meds:.acetaminophen **OR** acetaminophen, levalbuterol   OBJECTIVE: Blood pressure 109/74, pulse (!) 103, temperature 97.8 F (36.6 C), temperature source Oral, resp. rate 16, height '5\' 10"'  (1.778 m), weight 92.5 kg, SpO2 96 %.  Physical Exam General/constitutional: no distress, pleasant HEENT: Normocephalic, PER, Conj Clear, EOMI, Oropharynx clear Neck supple CV: rrr no mrg Lungs: clear to auscultation, normal respiratory effort Abd: Soft, Nontender Ext: no edema Skin: No Rash Neuro: nonfocal MSK: no peripheral joint swelling/tenderness/warmth; back spines nontender Psych alert/oriented  Central line presence: no    Lab Results Lab Results  Component Value Date   WBC 1.3 (LL) 10/07/2020   HGB 9.0 (L) 10/07/2020   HCT 27.2 (L) 10/07/2020   MCV 89.2 10/07/2020   PLT 160 10/07/2020    Lab Results  Component Value Date   CREATININE 1.12 10/07/2020   BUN 17 10/07/2020   NA 138 10/07/2020   K 3.6 10/07/2020   CL 109 10/07/2020   CO2 22 10/07/2020    Lab Results  Component Value Date   ALT 21 10/07/2020   AST 19 10/07/2020   ALKPHOS 46 10/07/2020   BILITOT 0.8 10/07/2020      Microbiology: Recent Results (from the past 240 hour(s))  Blood Culture (routine x 2)     Status: None (Preliminary result)   Collection Time: 10/05/20  8:17 PM   Specimen: Right Antecubital; Blood  Result Value Ref Range Status   Specimen Description   Final    RIGHT ANTECUBITAL Performed at [facility], [address] [place], [place] [address]    Special Requests   Final    BOTTLES DRAWN AEROBIC  AND ANAEROBIC Blood Culture adequate volume Performed at [facility], [address] [place], [place] [address]    Culture  Setup Time   Final    GRAM NEGATIVE RODS AEROBIC BOTTLE ONLY CRITICAL RESULT CALLED TO, READ BACK BY AND VERIFIED WITH: E JACKSON,[email or web address]'  10/06/21 [place] Performed at [facility], [address], [place], [place] [address]    Culture GRAM NEGATIVE RODS  Final   Report Status PENDING  Incomplete  Blood Culture ID Panel (Reflexed)     Status: Abnormal   Collection Time: 10/05/20  8:17 PM  Result Value Ref Range Status   Enterococcus faecalis NOT DETECTED NOT DETECTED Final   Enterococcus Faecium NOT DETECTED NOT DETECTED Final   Listeria monocytogenes NOT DETECTED NOT DETECTED Final  Staphylococcus species NOT DETECTED NOT DETECTED Final   Staphylococcus aureus (BCID) NOT DETECTED NOT DETECTED Final   Staphylococcus epidermidis NOT DETECTED NOT DETECTED Final   Staphylococcus lugdunensis NOT DETECTED NOT DETECTED Final   Streptococcus species NOT DETECTED NOT DETECTED Final   Streptococcus agalactiae NOT DETECTED NOT DETECTED Final   Streptococcus pneumoniae NOT DETECTED NOT DETECTED Final   Streptococcus pyogenes NOT DETECTED NOT DETECTED Final   A.calcoaceticus-baumannii NOT DETECTED NOT DETECTED Final   Bacteroides fragilis NOT DETECTED NOT DETECTED Final   Enterobacterales NOT DETECTED NOT DETECTED Final   Enterobacter cloacae complex NOT DETECTED NOT DETECTED Final   Escherichia coli NOT DETECTED NOT DETECTED Final   Klebsiella aerogenes NOT DETECTED NOT DETECTED Final   Klebsiella oxytoca NOT DETECTED NOT DETECTED Final   Klebsiella pneumoniae NOT DETECTED NOT DETECTED Final   Proteus species NOT DETECTED NOT DETECTED Final   Salmonella species NOT DETECTED NOT DETECTED Final   Serratia marcescens NOT DETECTED NOT DETECTED Final   Haemophilus influenzae NOT DETECTED NOT DETECTED Final   Neisseria meningitidis NOT DETECTED NOT  DETECTED Final   Pseudomonas aeruginosa DETECTED (A) NOT DETECTED Final    Comment: CRITICAL RESULT CALLED TO, READ BACK BY AND VERIFIED WITH: E JACKSON,[email or web address]'  10/07/20 [place]    Stenotrophomonas maltophilia NOT DETECTED NOT DETECTED Final   Candida albicans NOT DETECTED NOT DETECTED Final   Candida auris NOT DETECTED NOT DETECTED Final   Candida glabrata NOT DETECTED NOT DETECTED Final   Candida krusei NOT DETECTED NOT DETECTED Final   Candida parapsilosis NOT DETECTED NOT DETECTED Final   Candida tropicalis NOT DETECTED NOT DETECTED Final   Cryptococcus neoformans/gattii NOT DETECTED NOT DETECTED Final   CTX-M ESBL NOT DETECTED NOT DETECTED Final   Carbapenem resistance IMP NOT DETECTED NOT DETECTED Final   Carbapenem resistance KPC NOT DETECTED NOT DETECTED Final   Carbapenem resistance NDM NOT DETECTED NOT DETECTED Final   Carbapenem resistance VIM NOT DETECTED NOT DETECTED Final    Comment: Performed at [facility], [address], [place], [place] [address]  Blood Culture (routine x 2)     Status: None (Preliminary result)   Collection Time: 10/05/20  8:22 PM   Specimen: BLOOD RIGHT HAND  Result Value Ref Range Status   Specimen Description   Final    BLOOD RIGHT HAND Performed at [facility], [address] [place], [place] [address]    Special Requests   Final    BOTTLES DRAWN AEROBIC AND ANAEROBIC Blood Culture results may not be optimal due to an inadequate volume of blood received in culture bottles Performed at [facility], [address] [place], [place] [address]    Culture   Final    NO GROWTH < 24 HOURS Performed at [facility], [place] [address], [place], [place] [address]    Report Status PENDING  Incomplete  Resp Panel by RT-PCR (Flu A&B, Covid) Nasopharyngeal Swab     Status: None   Collection Time: 10/05/20  8:40 PM   Specimen: Nasopharyngeal Swab; Nasopharyngeal(NP) swabs in vial transport medium  Result Value Ref Range  Status   SARS Coronavirus 2 by RT PCR NEGATIVE NEGATIVE Final    Comment: (NOTE) SARS-CoV-2 target nucleic acids are NOT DETECTED.  The SARS-CoV-2 RNA is generally detectable in upper respiratory specimens during the acute phase of infection. The lowest concentration of SARS-CoV-2 viral copies this assay can detect is 138 copies/mL. A negative result does not  preclude SARS-Cov-2 infection and should not be used as the sole basis for treatment or other patient management decisions. A negative result may occur with  improper specimen collection/handling, submission of specimen other than nasopharyngeal swab, presence of viral mutation(s) within the areas targeted by this assay, and inadequate number of viral copies(<138 copies/mL). A negative result must be combined with clinical observations, patient history, and epidemiological information. The expected result is Negative.  Fact Sheet for Patients:  EntrepreneurPulse.com.au  Fact Sheet for Healthcare Providers:  IncredibleEmployment.be  This test is no t yet approved or cleared by the [place] FDA and  has been authorized for detection and/or diagnosis of SARS-CoV-2 by FDA under an Emergency Use Authorization (EUA). This EUA will remain  in effect (meaning this test can be used) for the duration of the COVID-19 declaration under Section 564(b)(1) of the Act, 21 U.S.C.section 360bbb-3(b)(1), unless the authorization is terminated  or revoked sooner.       Influenza A by PCR NEGATIVE NEGATIVE Final   Influenza B by PCR NEGATIVE NEGATIVE Final    Comment: (NOTE) The Xpert Xpress SARS-CoV-2/FLU/RSV plus assay is intended as an aid in the diagnosis of influenza from Nasopharyngeal swab specimens and should not be used as a sole basis for treatment. Nasal washings and aspirates are unacceptable for Xpert Xpress SARS-CoV-2/FLU/RSV testing.  Fact Sheet for  Patients: EntrepreneurPulse.com.au  Fact Sheet for Healthcare Providers: IncredibleEmployment.be  This test is not yet approved or cleared by the [place] FDA and has been authorized for detection and/or diagnosis of SARS-CoV-2 by FDA under an Emergency Use Authorization (EUA). This EUA will remain in effect (meaning this test can be used) for the duration of the COVID-19 declaration under Section 564(b)(1) of the Act, 21 U.S.C. section 360bbb-3(b)(1), unless the authorization is terminated or revoked.  Performed at [facility], [address] [place], [place] [address]   Urine Culture     Status: None   Collection Time: 10/05/20 10:15 PM   Specimen: In/Out Cath Urine  Result Value Ref Range Status   Specimen Description   Final    IN/OUT CATH URINE Performed at [facility], [address] [place], [place] [address]    Special Requests   Final    NONE Performed at [facility], [address] [place], [place] [address]    Culture   Final    NO GROWTH Performed at [facility], [address] [address], [place], [place] [address]    Report Status 10/07/2020 FINAL  Final     Serology:    Imaging: If present, new imagings (plain films, ct scans, and mri) have been personally visualized and interpreted; radiology reports have been reviewed. Decision making incorporated into the Impression / Recommendations.  8/22 chest ct 1. Increasingly conspicuous lymph nodes in the right hilar, mediastinal, lower right cervical and supraclavicular nodal stations concerning for potential metastatic disease, although some of this could be simply reactive in the setting of evolving postradiation changes in the right lung. Attention on follow-up studies is recommended. Treated right upper lobe lesion is once again cavitary and less apparent than on the prior examination. 2. Satellite nodule measuring 11 mm in the  right upper lobe, stable compared to the prior study. 3. Right adrenal metastasis, similar to the prior study. 4. 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 5. Cholelithiasis. 6. Aortic atherosclerosis, in addition to 2 vessel coronary artery disease. Please note that although  the presence of coronary artery calcium documents the presence of coronary artery disease, the severity of this disease and any potential stenosis cannot be assessed on this non-gated CT examination. Assessment for potential risk factor modification, dietary therapy or pharmacologic therapy may be warranted, if clinically indicated.  Jabier Mutton, [facility] [place] [phone] pager    10/07/2020, 5:03 PM

## 2020-10-08 DIAGNOSIS — R7881 Bacteremia: Secondary | ICD-10-CM | POA: Diagnosis not present

## 2020-10-08 DIAGNOSIS — B965 Pseudomonas (aeruginosa) (mallei) (pseudomallei) as the cause of diseases classified elsewhere: Secondary | ICD-10-CM | POA: Diagnosis not present

## 2020-10-08 LAB — COMPREHENSIVE METABOLIC PANEL
ALT: 22 U/L (ref 0–44)
AST: 18 U/L (ref 15–41)
Albumin: 2.2 g/dL — ABNORMAL LOW (ref 3.5–5.0)
Alkaline Phosphatase: 46 U/L (ref 38–126)
Anion gap: 3 — ABNORMAL LOW (ref 5–15)
BUN: 12 mg/dL (ref 6–20)
CO2: 22 mmol/L (ref 22–32)
Calcium: 7.8 mg/dL — ABNORMAL LOW (ref 8.9–10.3)
Chloride: 113 mmol/L — ABNORMAL HIGH (ref 98–111)
Creatinine, Ser: 0.8 mg/dL (ref 0.61–1.24)
GFR, Estimated: >60 mL/min (ref >60)
Glucose, Bld: 130 mg/dL — ABNORMAL HIGH (ref 70–99)
Potassium: 3.2 mmol/L — ABNORMAL LOW (ref 3.5–5.1)
Sodium: 138 mmol/L (ref 135–145)
Total Bilirubin: 0.4 mg/dL (ref 0.3–1.2)
Total Protein: 5.3 g/dL — ABNORMAL LOW (ref 6.5–8.1)

## 2020-10-08 LAB — CBC
HCT: 28.5 % — ABNORMAL LOW (ref 39.0–52.0)
Hemoglobin: 9.1 g/dL — ABNORMAL LOW (ref 13.0–17.0)
MCH: 29.2 pg (ref 26.0–34.0)
MCHC: 31.9 g/dL (ref 30.0–36.0)
MCV: 91.3 fL (ref 80.0–100.0)
Platelets: 181 10*3/uL (ref 150–400)
RBC: 3.12 MIL/uL — ABNORMAL LOW (ref 4.22–5.81)
RDW: 16.6 % — ABNORMAL HIGH (ref 11.5–15.5)
WBC: 1.4 10*3/uL — CL (ref 4.0–10.5)
nRBC: 0 % (ref 0.0–0.2)

## 2020-10-08 MED ORDER — RIVAROXABAN 20 MG PO TABS
20.0000 mg | ORAL_TABLET | Freq: Every day | ORAL | Status: DC
  Administered 2020-10-08 – 2020-10-12 (×5): 20 mg via ORAL
  Filled 2020-10-08 (×5): qty 1

## 2020-10-08 MED ORDER — POTASSIUM CHLORIDE CRYS ER 20 MEQ PO TBCR
40.0000 meq | EXTENDED_RELEASE_TABLET | Freq: Once | ORAL | Status: AC
  Administered 2020-10-08: 40 meq via ORAL
  Filled 2020-10-08: qty 2

## 2020-10-08 MED ORDER — PAROXETINE HCL 10 MG PO TABS
10.0000 mg | ORAL_TABLET | Freq: Once | ORAL | Status: AC
  Administered 2020-10-08: 10 mg via ORAL
  Filled 2020-10-08: qty 1

## 2020-10-08 NOTE — Plan of Care (Signed)
  Problem: Education: Goal: Knowledge of General Education information will improve Description: Including pain rating scale, medication(s)/side effects and non-pharmacologic comfort measures Outcome: Completed/Met   Problem: Health Behavior/Discharge Planning: Goal: Ability to manage health-related needs will improve Outcome: Progressing   Problem: Clinical Measurements: Goal: Ability to maintain clinical measurements within normal limits will improve Outcome: Progressing Goal: Will remain free from infection Outcome: Progressing Note: Awaiting serum culture sensitivities Goal: Diagnostic test results will improve Outcome: Progressing Goal: Respiratory complications will improve Outcome: Progressing Goal: Cardiovascular complication will be avoided Outcome: Progressing   Problem: Activity: Goal: Risk for activity intolerance will decrease Outcome: Progressing   Problem: Nutrition: Goal: Adequate nutrition will be maintained Outcome: Progressing   Problem: Elimination: Goal: Will not experience complications related to bowel motility Outcome: Completed/Met Goal: Will not experience complications related to urinary retention Outcome: Completed/Met   Problem: Pain Managment: Goal: General experience of comfort will improve Outcome: Progressing   Problem: Safety: Goal: Ability to remain free from injury will improve Outcome: Progressing   Problem: Skin Integrity: Goal: Risk for impaired skin integrity will decrease Outcome: Progressing

## 2020-10-08 NOTE — Discharge Instructions (Signed)

## 2020-10-08 NOTE — Progress Notes (Signed)
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 fib not on anticoagulation due to low score, GERD, history of CAD, on Toprol admitted with With neutropenic fever, sepsis with SIRS criteria   In the ED febrile 100.1, hyponatremic neutropenic.  Placed on vancomycin/cefepime. Chest x-ray nonacute de-escalation could be radiation pneumonitis versus superimposed infection, normal lactic acid, UA no evidence of UTI, COVID-19 negative. CT chest was pending and came back as: 1. Increasingly conspicuous lymph nodes in the right hilar, mediastinal, lower right cervical and supraclavicular nodal stations concerning for potential metastatic disease, although some of this could be simply reactive in the setting of evolving postradiation changes in the right lung. Attention on follow-up studies is recommended. Treated right upper lobe lesion is once again cavitary and less apparent than on the prior examination. 2. Satellite nodule measuring 11 mm in the right upper lobe, stable compared to the prior study. 3. Right adrenal metastasis, similar to the prior study. 4. 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 5. Cholelithiasis. 6. Aortic atherosclerosis, in addition to 2 vessel coronary artery disease.   Subjective:  Seen and examined this morning.  Overnight no fever Resting comfortably on the bedside chair. WBC count slightly low at 1400, potassium 3.2.    Assessment & Plan:  Neutropenic fever Pseudomonas sepsis  POA Immunosuppressed in the setting of immunotherapy, long-term prednisone use for RA No respiratory symptoms or urinary symptoms.   Procalcitonin elevated 7.9 normal lactic acid.  BP soft on admission.  Currently hemodynamically stable.  Pseudomonas bacteremia, ID input appreciated-off vancomycin, continue cefepime, no obvious complication unclear source.  Planning for 10 days course of antibiotics.  Added prophylactic oral vancomycin twice daily for the duration of systemic antibiotic +1-week given high risk of recurrence of his previous C. difficile.  Can transition to oral agent if pending c/s- 2 days after fever defervesces  Neutropenia with anemia, thrombocytopenia and neutropenia: Not due to immunotherapy per oncology.  He has predated pancytopenia prior to his immunotherapy likely chronic component.  Drop suspected due to his sepsis.  WBC is stable today, check ANC in the morning.  Patient refusing Granix due to intolerance.  ?felty syndrome. Recent Labs  Lab 10/06/20 0430 10/07/20 0443 10/08/20 0508  HGB 9.3* 9.0* 9.1*  HCT 28.3* 27.2* 28.5*  WBC 1.4* 1.3* 1.4*  PLT 132* 160 181     Hypomagnesemia was repleted.   Hypokalemia replacement ordered. Hypotension-It has been soft on admission.  Stable now, stop ivf. Hyponatremia: Suspect volume depletion dehydration / improved Recent Labs  Lab 10/05/20 2040 10/06/20 0430 10/07/20 0443 10/08/20 0508  NA 129* 133* 138 138     RA on long-term prednisone-now on stress dose hydrocortisone-transition to oral steroids hopefully in am. He wants to stick to only 5 mg  SCC stage IV lung SCC with adrenal metastasis on immunotherapy-Dr Ennever notified/following.  CT chest shows lymph nodes in the right hilar mediastinal nor right cervical and supraclavicular node-?  Potentially metastatic disease versus reactive lymph nodes.  Oncology following closely- they have started xarelto.  PAF with RVR- needing IV metoprolol.  Metoprolol resumed at lower dose , HR stable in a fib. As Blood pressure appears  stable we can uptitrate to home dose.CHADS2VASC2 score is 0 so does not need  anticoagulation for a fib stand point. 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 Likely false positive. ID on board.  Diet Order             Diet Heart Room service appropriate? Yes; Fluid consistency: Thin  Diet effective now                 Patient's Body mass index is 29.27 kg/m. DVT prophylaxis:  Xarelto. Code Status:   Code Status: Full Code  Family Communication: plan of care discussed with patient at bedside. Status is: Inpatient Remains inpatient appropriate because:IV treatments appropriate due to intensity of illness or inability to take PO and Inpatient level of care appropriate due to severity of illness Dispo: The patient is from: Home              Anticipated d/c is to: Home 24-48 hrs              Patient currently is not medically stable to d/c.   Difficult to place patient No Unresulted Labs (From admission, onward)     Start     Ordered   10/07/20 0500  CBC  Daily,   R     Question:  Specimen collection method  Answer:  Lab=Lab collect   10/06/20 0805   10/07/20 0500  Comprehensive metabolic panel  Daily,   R     Question:  Specimen collection method  Answer:  Lab=Lab collect   10/06/20 0805   10/06/20 0139  HIV-1/2 AB - differentiation  Once,   AD        10/06/20 0139   10/06/20 0114  Expectorated Sputum Assessment w Gram Stain, Rflx to Resp Cult  Once,   R        10/06/20 0113          Medications reviewed:  Scheduled Meds:  hydrocortisone sod succinate (SOLU-CORTEF) inj  100 mg Intravenous Q12H   levalbuterol  0.63 mg Nebulization BID   metoprolol tartrate  12.5 mg Oral BID   pantoprazole  40 mg Oral QHS   rivaroxaban  20 mg Oral Q supper   vancomycin  125 mg Oral BID   Continuous Infusions:  sodium chloride 125 mL/hr at 10/07/20 1954   ceFEPime (MAXIPIME) IV 2 g (10/08/20 0543)    Consultants:see note  Procedures:see note Antimicrobials: Anti-infectives (From admission, onward)    Start     Dose/Rate Route Frequency Ordered Stop   10/07/20 2200  vancomycin (VANCOCIN) capsule 125 mg        125 mg Oral 2 times daily 10/07/20 1718 10/22/20 2159   10/06/20 2000  vancomycin (VANCOREADY) IVPB 1500 mg/300 mL  Status:  Discontinued        1,500 mg 150 mL/hr over 120 Minutes Intravenous Every 24 hours 10/05/20 2149 10/06/20 1046   10/06/20 1200  vancomycin (VANCOREADY) IVPB 1250 mg/250 mL  Status:  Discontinued        1,250 mg 166.7 mL/hr over 90 Minutes Intravenous Every 12 hours 10/06/20 1046 10/07/20 1427   10/06/20 0600  ceFEPIme (MAXIPIME) 2 g in sodium chloride 0.9 % 100 mL IVPB        2 g 200 mL/hr over 30 Minutes Intravenous Every 8 hours 10/05/20 2149  10/05/20 2030  ceFEPIme (MAXIPIME) 2 g in sodium chloride 0.9 % 100 mL IVPB        2 g 200 mL/hr over 30 Minutes Intravenous  Once 10/05/20 2017 10/05/20 2216   10/05/20 2030  vancomycin (VANCOCIN) IVPB 1000 mg/200 mL premix  Status:  Discontinued        1,000 mg 200 mL/hr over 60 Minutes Intravenous  Once 10/05/20 2017 10/05/20 2029   10/05/20 2030  vancomycin (VANCOCIN) IVPB 1000 mg/200 mL premix        1,000 mg 200 mL/hr over 60 Minutes Intravenous Every hour 10/05/20 2029 10/05/20 2319      Culture/Microbiology    Component Value Date/Time   SDES  10/05/2020 2215    IN/OUT CATH URINE Performed at [facility], [address] [place], [place] [address]    [facility]  10/05/2020 2215    NONE Performed at [facility], [address] [place], [place] [address]    CULT  10/05/2020 2215    NO GROWTH Performed at [facility], [place] [address], [place], [place] [address]    REPTSTATUS 10/07/2020 FINAL 10/05/2020 2215    Other culture-see note  Objective: Vitals: Today's Vitals   10/08/20 0547 10/08/20 0754 10/08/20 0814 10/08/20 0815  BP: 102/62  112/82    Pulse: 77  94   Resp: 18  (!) 22   Temp: 97.6 F (36.4 C)  98.5 F (36.9 C)   TempSrc: Oral  Oral   SpO2: 97% 92% 97%   Weight:      Height:      PainSc:    0-No pain    Intake/Output Summary (Last 24 hours) at 10/08/2020 0931 Last data filed at 10/08/2020 0100 Gross per 24 hour  Intake 5888.67 ml  Output --  Net 5888.67 ml    Filed Weights   10/05/20 1956  Weight: 92.5 kg   Weight change:   Intake/Output from previous day: 08/23 0701 - 08/24 0700 In: 6128.7 [P.O.:720; I.V.:4105.6; IV Piggyback:1303.1] Out: -  Intake/Output this shift: No intake/output data recorded. Filed Weights   10/05/20 1956  Weight: 92.5 kg   Examination: General exam: AAOx 3 older than stated age, weak appearing. HEENT:Oral mucosa moist, Ear/Nose WNL grossly, dentition normal. Respiratory system: bilaterally diminished,no use of accessory muscle Cardiovascular system: S1 & S2 +, No JVD,. Gastrointestinal system: Abdomen soft,NT,ND, BS+ Nervous System:Alert, awake, moving extremities and grossly nonfocal Extremities: no edema, distal peripheral pulses palpable.  Skin: No rashes,no icterus. MSK: Normal muscle bulk,tone, power   Data Reviewed: I have personally reviewed following labs and imaging studies CBC: Recent Labs  Lab 10/05/20 2040 10/06/20 0430 10/07/20 0443 10/08/20 0508  WBC 1.8* 1.4* 1.3* 1.4*  NEUTROABS 0.6* 0.4*  --   --   HGB 11.8* 9.3* 9.0* 9.1*  HCT 34.7* 28.3* 27.2* 28.5*  MCV 87.2 89.6 89.2 91.3  PLT 175 132* 160 300    Basic Metabolic Panel: Recent Labs  Lab 10/05/20 2040 10/06/20 0139 10/06/20 0430 10/07/20 0443 10/08/20 0508  NA 129*  --  133* 138 138  K 3.9  --  3.8 3.6 3.2*  CL 95*  --  102 109 113*  CO2 25  --  25 22 22   GLUCOSE 115*  --  118* 153* 130*  BUN 14  --  14 17 12   CREATININE 1.27*  --  1.01 1.12 0.80  CALCIUM 8.3*  --  7.9* 8.0* 7.8*  MG  --  1.5* 1.5* 2.1  --   PHOS  --  3.9  --   --   --     GFR: Estimated Creatinine  Clearance: 113.6 mL/min (by C-G formula based on SCr of 0.8 mg/dL). Liver Function Tests: Recent Labs  Lab 10/05/20 2040 10/06/20 0430 10/07/20 0443 10/08/20 0508  AST 36 27 19 18   ALT 26 23 21 22   ALKPHOS 74 53 46 46  BILITOT 1.3* 1.3* 0.8 0.4  PROT 6.8 5.7* 5.5* 5.3*  ALBUMIN 2.9* 2.4* 2.3* 2.2*    No results for input(s): LIPASE, AMYLASE in the last 168 hours. No results for input(s): AMMONIA in the last 168 hours. Coagulation Profile: Recent Labs  Lab 10/05/20 2040  INR 1.1    Cardiac Enzymes: No results for input(s): CKTOTAL, CKMB, CKMBINDEX, TROPONINI in the last 168 hours. BNP (last 3 results) No results for input(s): PROBNP in the last 8760 hours. HbA1C: No results for input(s): HGBA1C in the last 72 hours. CBG: Recent Labs  Lab 10/07/20 1117  GLUCAP 134*   Lipid Profile: No results for input(s): CHOL, HDL, LDLCALC, TRIG, CHOLHDL, LDLDIRECT in the last 72 hours. Thyroid Function Tests: Recent Labs    10/06/20 0147  TSH 2.992    Anemia Panel: No results for input(s): VITAMINB12, FOLATE, FERRITIN, TIBC, IRON, RETICCTPCT in the last 72 hours. Sepsis Labs: Recent Labs  Lab 10/05/20 2040 10/06/20 0046 10/06/20 0139 10/07/20 0443  PROCALCITON  --   --  7.93 5.34  LATICACIDVEN 1.7 1.0  --   --      Recent Results (from the past 240 hour(s))  Blood Culture (routine x 2)     Status: Abnormal (Preliminary result)   Collection Time: 10/05/20  8:17 PM   Specimen: Right Antecubital; Blood  Result Value Ref Range Status   Specimen Description   Final    RIGHT ANTECUBITAL Performed at [facility], [address] [place], [place] [address]    Special Requests   Final    BOTTLES DRAWN AEROBIC AND ANAEROBIC Blood Culture adequate volume Performed at [facility], [address] [place], [place] [address]    Culture  Setup Time   Final    GRAM NEGATIVE RODS AEROBIC BOTTLE ONLY CRITICAL RESULT CALLED TO, READ BACK BY AND  VERIFIED WITH: E JACKSON,[id number]  10/06/21 [place]    Culture (A)  Final    PSEUDOMONAS AERUGINOSA SUSCEPTIBILITIES TO FOLLOW Performed at [facility], [address] [place], [place] [address]    Report Status PENDING  Incomplete  Blood Culture ID Panel (Reflexed)     Status: Abnormal   Collection Time: 10/05/20  8:17 PM  Result Value Ref Range Status   Enterococcus faecalis NOT DETECTED NOT DETECTED Final   Enterococcus Faecium NOT DETECTED NOT DETECTED Final   Listeria monocytogenes NOT DETECTED NOT DETECTED Final   Staphylococcus species NOT DETECTED NOT DETECTED Final   Staphylococcus aureus (BCID) NOT DETECTED NOT DETECTED Final   Staphylococcus epidermidis NOT DETECTED NOT DETECTED Final   Staphylococcus lugdunensis NOT DETECTED NOT DETECTED Final   Streptococcus species NOT DETECTED NOT DETECTED Final   Streptococcus agalactiae NOT DETECTED NOT DETECTED Final   Streptococcus pneumoniae NOT DETECTED NOT DETECTED Final   Streptococcus pyogenes NOT DETECTED NOT DETECTED Final   A.calcoaceticus-baumannii NOT DETECTED NOT DETECTED Final   Bacteroides fragilis NOT DETECTED NOT DETECTED Final   Enterobacterales NOT DETECTED NOT DETECTED Final   Enterobacter cloacae complex NOT DETECTED NOT DETECTED  Final   Escherichia coli NOT DETECTED NOT DETECTED Final   Klebsiella aerogenes NOT DETECTED NOT DETECTED Final   Klebsiella oxytoca NOT DETECTED NOT DETECTED Final   Klebsiella pneumoniae NOT DETECTED NOT DETECTED Final   Proteus species NOT DETECTED NOT DETECTED Final   Salmonella species NOT DETECTED NOT DETECTED Final   Serratia marcescens NOT DETECTED NOT DETECTED Final   Haemophilus influenzae NOT DETECTED NOT DETECTED Final   Neisseria meningitidis NOT DETECTED NOT DETECTED Final   Pseudomonas aeruginosa DETECTED (A) NOT DETECTED Final    Comment: CRITICAL RESULT CALLED TO, READ BACK BY AND VERIFIED WITH: E JACKSON,[email or web address]@[id number]  10/07/20 [place]    Stenotrophomonas maltophilia NOT  DETECTED NOT DETECTED Final   Candida albicans NOT DETECTED NOT DETECTED Final   Candida auris NOT DETECTED NOT DETECTED Final   Candida glabrata NOT DETECTED NOT DETECTED Final   Candida krusei NOT DETECTED NOT DETECTED Final   Candida parapsilosis NOT DETECTED NOT DETECTED Final   Candida tropicalis NOT DETECTED NOT DETECTED Final   Cryptococcus neoformans/gattii NOT DETECTED NOT DETECTED Final   CTX-M ESBL NOT DETECTED NOT DETECTED Final   Carbapenem resistance IMP NOT DETECTED NOT DETECTED Final   Carbapenem resistance KPC NOT DETECTED NOT DETECTED Final   Carbapenem resistance NDM NOT DETECTED NOT DETECTED Final   Carbapenem resistance VIM NOT DETECTED NOT DETECTED Final    Comment: Performed at [facility], [address], [place], [place] [address]  Blood Culture (routine x 2)     Status: None (Preliminary result)   Collection Time: 10/05/20  8:22 PM   Specimen: BLOOD RIGHT HAND  Result Value Ref Range Status   Specimen Description   Final    BLOOD RIGHT HAND Performed at [facility], [address] [place], [place] [address]    Special Requests   Final    BOTTLES DRAWN AEROBIC AND ANAEROBIC Blood Culture results may not be optimal due to an inadequate volume of blood received in culture bottles Performed at [facility], [address] [place], [place] [address]    Culture   Final    NO GROWTH 2 DAYS Performed at [facility], [place] [address] [place], [place] [address]    Report Status PENDING  Incomplete  Resp Panel by RT-PCR (Flu A&B, Covid) Nasopharyngeal Swab     Status: None   Collection Time: 10/05/20  8:40 PM   Specimen: Nasopharyngeal Swab; Nasopharyngeal(NP) swabs in vial transport medium  Result Value Ref Range Status   SARS Coronavirus 2 by RT PCR NEGATIVE NEGATIVE Final    Comment: (NOTE) SARS-CoV-2 target nucleic acids are NOT DETECTED.  The SARS-CoV-2 RNA is generally detectable in upper respiratory specimens during  the acute phase of infection. The lowest concentration of SARS-CoV-2 viral copies this assay can detect is 138 copies/mL. A negative result does not preclude SARS-Cov-2 infection and should not be used as the sole basis for treatment or other patient management decisions. A negative result may occur with  improper specimen collection/handling, submission of specimen other than nasopharyngeal swab, presence of viral mutation(s) within the areas targeted by this assay, and inadequate number of viral copies(<138 copies/mL). A negative result must be combined with clinical observations, patient history, and epidemiological information. The expected result is Negative.  Fact Sheet for Patients:  EntrepreneurPulse.com.au  Fact Sheet for Healthcare Providers:  IncredibleEmployment.be  This test is no t yet approved or cleared by the [place] FDA and  has  been authorized for detection and/or diagnosis of SARS-CoV-2 by FDA under an Emergency Use Authorization (EUA). This EUA will remain  in effect (meaning this test can be used) for the duration of the COVID-19 declaration under Section 564(b)(1) of the Act, 21 U.S.C.section 360bbb-3(b)(1), unless the authorization is terminated  or revoked sooner.       Influenza A by PCR NEGATIVE NEGATIVE Final   Influenza B by PCR NEGATIVE NEGATIVE Final    Comment: (NOTE) The Xpert Xpress SARS-CoV-2/FLU/RSV plus assay is intended as an aid in the diagnosis of influenza from Nasopharyngeal swab specimens and should not be used as a sole basis for treatment. Nasal washings and aspirates are unacceptable for Xpert Xpress SARS-CoV-2/FLU/RSV testing.  Fact Sheet for Patients: EntrepreneurPulse.com.au  Fact Sheet for Healthcare Providers: IncredibleEmployment.be  This test is not yet approved or cleared by the [place] FDA and has been authorized for detection and/or  diagnosis of SARS-CoV-2 by FDA under an Emergency Use Authorization (EUA). This EUA will remain in effect (meaning this test can be used) for the duration of the COVID-19 declaration under Section 564(b)(1) of the Act, 21 U.S.C. section 360bbb-3(b)(1), unless the authorization is terminated or revoked.  Performed at [facility], [address], [place], [place] [address]   Urine Culture     Status: None   Collection Time: 10/05/20 10:15 PM   Specimen: In/Out Cath Urine  Result Value Ref Range Status   Specimen Description   Final    IN/OUT CATH URINE Performed at [facility], [address] [place], [place] [address]    Special Requests   Final    NONE Performed at [facility], [address] [place], [place] [address]    Culture   Final    NO GROWTH Performed at [facility], [place] [address] [place],  [address]    Report Status 10/07/2020 FINAL  Final      Radiology Studies: No results found.   LOS: 2 days   Antonieta Pert, MD [facility]  10/08/2020, 9:31 AM

## 2020-10-09 ENCOUNTER — Other Ambulatory Visit: Payer: Self-pay | Admitting: Physician Assistant

## 2020-10-09 DIAGNOSIS — B965 Pseudomonas (aeruginosa) (mallei) (pseudomallei) as the cause of diseases classified elsewhere: Secondary | ICD-10-CM | POA: Diagnosis not present

## 2020-10-09 DIAGNOSIS — I4891 Unspecified atrial fibrillation: Secondary | ICD-10-CM | POA: Diagnosis not present

## 2020-10-09 DIAGNOSIS — D709 Neutropenia, unspecified: Secondary | ICD-10-CM | POA: Diagnosis not present

## 2020-10-09 DIAGNOSIS — R7881 Bacteremia: Secondary | ICD-10-CM | POA: Diagnosis not present

## 2020-10-09 LAB — CBC WITH DIFFERENTIAL/PLATELET
Abs Immature Granulocytes: 0.01 10*3/uL (ref 0.00–0.07)
Basophils Absolute: 0 10*3/uL (ref 0.0–0.1)
Basophils Relative: 0 %
Eosinophils Absolute: 0 10*3/uL (ref 0.0–0.5)
Eosinophils Relative: 0 %
HCT: 30.3 % — ABNORMAL LOW (ref 39.0–52.0)
Hemoglobin: 9.9 g/dL — ABNORMAL LOW (ref 13.0–17.0)
Immature Granulocytes: 1 %
Lymphocytes Relative: 32 %
Lymphs Abs: 0.5 10*3/uL — ABNORMAL LOW (ref 0.7–4.0)
MCH: 29.6 pg (ref 26.0–34.0)
MCHC: 32.7 g/dL (ref 30.0–36.0)
MCV: 90.7 fL (ref 80.0–100.0)
Monocytes Absolute: 0.3 10*3/uL (ref 0.1–1.0)
Monocytes Relative: 22 %
Neutro Abs: 0.7 10*3/uL — ABNORMAL LOW (ref 1.7–7.7)
Neutrophils Relative %: 45 %
Platelets: 197 10*3/uL (ref 150–400)
RBC: 3.34 MIL/uL — ABNORMAL LOW (ref 4.22–5.81)
RDW: 16.3 % — ABNORMAL HIGH (ref 11.5–15.5)
WBC: 1.6 10*3/uL — ABNORMAL LOW (ref 4.0–10.5)
nRBC: 0 % (ref 0.0–0.2)

## 2020-10-09 LAB — BASIC METABOLIC PANEL
Anion gap: 5 (ref 5–15)
BUN: 11 mg/dL (ref 6–20)
CO2: 23 mmol/L (ref 22–32)
Calcium: 8.1 mg/dL — ABNORMAL LOW (ref 8.9–10.3)
Chloride: 112 mmol/L — ABNORMAL HIGH (ref 98–111)
Creatinine, Ser: 0.93 mg/dL (ref 0.61–1.24)
GFR, Estimated: >60 mL/min (ref >60)
Glucose, Bld: 138 mg/dL — ABNORMAL HIGH (ref 70–99)
Potassium: 3.5 mmol/L (ref 3.5–5.1)
Sodium: 140 mmol/L (ref 135–145)

## 2020-10-09 LAB — CULTURE, BLOOD (ROUTINE X 2): Special Requests: ADEQUATE

## 2020-10-09 LAB — HIV-1/HIV-2 QUALITATIVE RNA
Final Interpretation: NEGATIVE
HIV-1 RNA, Qualitative: NONREACTIVE
HIV-2 RNA, Qualitative: NONREACTIVE

## 2020-10-09 LAB — HIV-1/2 AB - DIFFERENTIATION
HIV 1 Ab: NONREACTIVE
HIV 2 Ab: NONREACTIVE
Note: NEGATIVE

## 2020-10-09 LAB — GLUCOSE, CAPILLARY: Glucose-Capillary: 146 mg/dL — ABNORMAL HIGH (ref 70–99)

## 2020-10-09 MED ORDER — CIPROFLOXACIN HCL 500 MG PO TABS
750.0000 mg | ORAL_TABLET | Freq: Two times a day (BID) | ORAL | Status: DC
  Administered 2020-10-09 – 2020-10-10 (×3): 750 mg via ORAL
  Filled 2020-10-09 (×4): qty 1

## 2020-10-09 MED ORDER — VANCOMYCIN HCL 125 MG PO CAPS
125.0000 mg | ORAL_CAPSULE | Freq: Two times a day (BID) | ORAL | Status: DC
  Administered 2020-10-10 – 2020-10-14 (×8): 125 mg via ORAL
  Filled 2020-10-09 (×11): qty 1

## 2020-10-09 MED ORDER — MAGIC MOUTHWASH
1.0000 mL | Freq: Three times a day (TID) | ORAL | Status: DC | PRN
  Filled 2020-10-09 (×2): qty 5

## 2020-10-09 MED ORDER — MENTHOL 3 MG MT LOZG: 1.0000 | LOZENGE | OROMUCOSAL | Status: DC | PRN

## 2020-10-09 MED ORDER — PAROXETINE HCL 10 MG PO TABS
10.0000 mg | ORAL_TABLET | Freq: Every day | ORAL | Status: DC
  Administered 2020-10-09 – 2020-10-14 (×5): 10 mg via ORAL
  Filled 2020-10-09 (×6): qty 1

## 2020-10-09 MED ORDER — HYDROCORTISONE NA SUCCINATE PF 100 MG IJ SOLR
50.0000 mg | Freq: Two times a day (BID) | INTRAMUSCULAR | Status: DC
  Administered 2020-10-09: 50 mg via INTRAVENOUS
  Filled 2020-10-09: qty 2

## 2020-10-09 MED ORDER — METOPROLOL TARTRATE 25 MG PO TABS
12.5000 mg | ORAL_TABLET | Freq: Once | ORAL | Status: AC
  Administered 2020-10-09: 12.5 mg via ORAL
  Filled 2020-10-09: qty 1

## 2020-10-09 MED ORDER — METOPROLOL TARTRATE 25 MG PO TABS
25.0000 mg | ORAL_TABLET | Freq: Two times a day (BID) | ORAL | Status: DC
  Administered 2020-10-09 – 2020-10-14 (×10): 25 mg via ORAL
  Filled 2020-10-09 (×10): qty 1

## 2020-10-09 NOTE — Progress Notes (Signed)
[facility]  Date of Admission:  10/05/2020     CC: Pseudomonas bacteremia  Abx: 8/25-c cipro  8/21-25 cefepime   ASSESSMENT: Pseudomonas bacteremia Relative neutropenia (mild) Stage IV non-small cell lung cancer on pembrolizumab Hx cdiff 04/2020   Patient remains well on appropriate abx  PLAN: Continue cipro for another 3 days until 8/28 Continue bid PO vancomycin 125 mg capsule until 9/4 for secondary cdiff prophylaxis Ok to discharge from id standpoint Discussed with primary team  I spent more than 35 minute reviewing data/chart, and coordinating care and >50% direct face to face time providing counseling/discussing diagnostics/treatment plan with patient   Active Problems:   COPD with asthma ([place])   Hyponatremia   Neutropenic fever ([place])   Bacteremia due to Pseudomonas   Paroxysmal A-fib ([facility])   RA (rheumatoid arthritis) ([facility])   Hypotension   Sepsis ([place])   Malignant neoplasm of lung ([place])   Allergies  Allergen Reactions   Albuterol     Patient has had episode of atrial fib following administration of albuterol (tolerates Xopenex well)    Scheduled Meds:  ciprofloxacin  750 mg Oral BID   hydrocortisone sod succinate (SOLU-CORTEF) inj  50 mg Intravenous Q12H   levalbuterol  0.63 mg Nebulization BID   metoprolol tartrate  12.5 mg Oral BID   pantoprazole  40 mg Oral QHS   rivaroxaban  20 mg Oral Q supper   Continuous Infusions: PRN Meds:.acetaminophen **OR** acetaminophen, levalbuterol, magic mouthwash   SUBJECTIVE: No n/v/diarrhea/rash Feels well  Afebrile PsA sensitive to cipro  Review of Systems: ROS All other ROS was negative, except mentioned above     OBJECTIVE: Vitals:   10/09/20 0544 10/09/20 0546 10/09/20 0712 10/09/20 0802  BP: (!) 87/56 99/77 119/70   Pulse:  (!) 103 96   Resp:  18    Temp:  98 F (36.7 C)    TempSrc:  Oral    SpO2:  97%  99%  Weight:      Height:       Body mass  index is 29.27 kg/m.  Physical Exam  General/constitutional: no distress, pleasant HEENT: Normocephalic, PER, Conj Clear, EOMI, Oropharynx clear Neck supple CV: rrr no mrg Lungs: clear to auscultation, normal respiratory effort Abd: Soft, Nontender Ext: no edema Skin: No Rash Neuro: nonfocal MSK: no peripheral joint swelling/tenderness/warmth; back spines nontender   Lab Results Lab Results  Component Value Date   WBC 1.6 (L) 10/09/2020   HGB 9.9 (L) 10/09/2020   HCT 30.3 (L) 10/09/2020   MCV 90.7 10/09/2020   PLT 197 10/09/2020    Lab Results  Component Value Date   CREATININE 0.93 10/09/2020   BUN 11 10/09/2020   NA 140 10/09/2020   K 3.5 10/09/2020   CL 112 (H) 10/09/2020   CO2 23 10/09/2020    Lab Results  Component Value Date   ALT 22 10/08/2020   AST 18 10/08/2020   ALKPHOS 46 10/08/2020   BILITOT 0.4 10/08/2020      Microbiology: Recent Results (from the past 240 hour(s))  Blood Culture (routine x 2)     Status: Abnormal   Collection Time: 10/05/20  8:17 PM   Specimen: Right Antecubital; Blood  Result Value Ref Range Status   Specimen Description   Final    RIGHT ANTECUBITAL Performed at [facility], [address] [place],  [address]    Special Requests   Final  BOTTLES DRAWN AEROBIC AND ANAEROBIC Blood Culture adequate volume Performed at [facility], [address] [place], [place] [address]    Culture  Setup Time   Final    GRAM NEGATIVE RODS AEROBIC BOTTLE ONLY CRITICAL RESULT CALLED TO, READ BACK BY AND VERIFIED WITH: E JACKSON,[id number]  10/06/21 [place] Performed at [facility], [address] [place], [place] [address]    Culture PSEUDOMONAS AERUGINOSA (A)  Final   Report Status 10/09/2020 FINAL  Final   Organism ID, Bacteria PSEUDOMONAS AERUGINOSA  Final      Susceptibility   Pseudomonas aeruginosa - MIC*    CEFTAZIDIME 4 SENSITIVE Sensitive     CIPROFLOXACIN <=0.25 SENSITIVE Sensitive      GENTAMICIN <=1 SENSITIVE Sensitive     IMIPENEM 2 SENSITIVE Sensitive     PIP/TAZO 8 SENSITIVE Sensitive     CEFEPIME 2 SENSITIVE Sensitive     * PSEUDOMONAS AERUGINOSA  Blood Culture ID Panel (Reflexed)     Status: Abnormal   Collection Time: 10/05/20  8:17 PM  Result Value Ref Range Status   Enterococcus faecalis NOT DETECTED NOT DETECTED Final   Enterococcus Faecium NOT DETECTED NOT DETECTED Final   Listeria monocytogenes NOT DETECTED NOT DETECTED Final   Staphylococcus species NOT DETECTED NOT DETECTED Final   Staphylococcus aureus (BCID) NOT DETECTED NOT DETECTED Final   Staphylococcus epidermidis NOT DETECTED NOT DETECTED Final   Staphylococcus lugdunensis NOT DETECTED NOT DETECTED Final   Streptococcus species NOT DETECTED NOT DETECTED Final   Streptococcus agalactiae NOT DETECTED NOT DETECTED Final   Streptococcus pneumoniae NOT DETECTED NOT DETECTED Final   Streptococcus pyogenes NOT DETECTED NOT DETECTED Final   A.calcoaceticus-baumannii NOT DETECTED NOT DETECTED Final   Bacteroides fragilis NOT DETECTED NOT DETECTED Final   Enterobacterales NOT DETECTED NOT DETECTED Final   Enterobacter cloacae complex NOT DETECTED NOT DETECTED Final   Escherichia coli NOT DETECTED NOT DETECTED Final   Klebsiella aerogenes NOT DETECTED NOT DETECTED Final   Klebsiella oxytoca NOT DETECTED NOT DETECTED Final   Klebsiella pneumoniae NOT DETECTED NOT DETECTED Final   Proteus species NOT DETECTED NOT DETECTED Final   Salmonella species NOT DETECTED NOT DETECTED Final   Serratia marcescens NOT DETECTED NOT DETECTED Final   Haemophilus influenzae NOT DETECTED NOT DETECTED Final   Neisseria meningitidis NOT DETECTED NOT DETECTED Final   Pseudomonas aeruginosa DETECTED (A) NOT DETECTED Final    Comment: CRITICAL RESULT CALLED TO, READ BACK BY AND VERIFIED WITH: E JACKSON,[id number]  10/07/20 [place]    Stenotrophomonas maltophilia NOT DETECTED NOT DETECTED Final   Candida albicans NOT DETECTED NOT  DETECTED Final   Candida auris NOT DETECTED NOT DETECTED Final   Candida glabrata NOT DETECTED NOT DETECTED Final   Candida krusei NOT DETECTED NOT DETECTED Final   Candida parapsilosis NOT DETECTED NOT DETECTED Final   Candida tropicalis NOT DETECTED NOT DETECTED Final   Cryptococcus neoformans/gattii NOT DETECTED NOT DETECTED Final   CTX-M ESBL NOT DETECTED NOT DETECTED Final   Carbapenem resistance IMP NOT DETECTED NOT DETECTED Final   Carbapenem resistance KPC NOT DETECTED NOT DETECTED Final   Carbapenem resistance NDM NOT DETECTED NOT DETECTED Final   Carbapenem resistance VIM NOT DETECTED NOT DETECTED Final    Comment: Performed at [facility], [place] [address] [place], [place] [address]  Blood Culture (routine x 2)     Status: None (Preliminary result)   Collection Time: 10/05/20  8:22 PM   Specimen: BLOOD RIGHT HAND  Result Value Ref Range Status   Specimen Description   Final    BLOOD RIGHT HAND Performed at [facility], [address] [place], [place] [address]    Special Requests   Final    BOTTLES DRAWN AEROBIC AND ANAEROBIC Blood Culture results may not be optimal due to an inadequate volume of blood received in culture bottles Performed at [facility], [address] [place], [place] [address]    Culture   Final    NO GROWTH 3 DAYS Performed at [facility], [place] [address], [place], [place] [address]    Report Status PENDING  Incomplete  Resp Panel by RT-PCR (Flu A&B, Covid) Nasopharyngeal Swab     Status: None   Collection Time: 10/05/20  8:40 PM   Specimen: Nasopharyngeal Swab; Nasopharyngeal(NP) swabs in vial transport medium  Result Value Ref Range Status   SARS Coronavirus 2 by RT PCR NEGATIVE NEGATIVE Final    Comment: (NOTE) SARS-CoV-2 target nucleic acids are NOT DETECTED.  The SARS-CoV-2 RNA is generally detectable in upper respiratory specimens during the acute phase of infection. The lowest concentration of  SARS-CoV-2 viral copies this assay can detect is 138 copies/mL. A negative result does not preclude SARS-Cov-2 infection and should not be used as the sole basis for treatment or other patient management decisions. A negative result may occur with  improper specimen collection/handling, submission of specimen other than nasopharyngeal swab, presence of viral mutation(s) within the areas targeted by this assay, and inadequate number of viral copies(<138 copies/mL). A negative result must be combined with clinical observations, patient history, and epidemiological information. The expected result is Negative.  Fact Sheet for Patients:  EntrepreneurPulse.com.au  Fact Sheet for Healthcare Providers:  IncredibleEmployment.be  This test is no t yet approved or cleared by the [place] FDA and  has been authorized for detection and/or diagnosis of SARS-CoV-2 by FDA under an Emergency Use Authorization (EUA). This EUA will remain  in effect (meaning this test can be used) for the duration of the COVID-19 declaration under Section 564(b)(1) of the Act, 21 U.S.C.section 360bbb-3(b)(1), unless the authorization is terminated  or revoked sooner.       Influenza A by PCR NEGATIVE NEGATIVE Final   Influenza B by PCR NEGATIVE NEGATIVE Final    Comment: (NOTE) The Xpert Xpress SARS-CoV-2/FLU/RSV plus assay is intended as an aid in the diagnosis of influenza from Nasopharyngeal swab specimens and should not be used as a sole basis for treatment. Nasal washings and aspirates are unacceptable for Xpert Xpress SARS-CoV-2/FLU/RSV testing.  Fact Sheet for Patients: EntrepreneurPulse.com.au  Fact Sheet for Healthcare Providers: IncredibleEmployment.be  This test is not yet approved or cleared by the [place] FDA and has been authorized for detection and/or diagnosis of SARS-CoV-2 by FDA under an Emergency Use  Authorization (EUA). This EUA will remain in effect (meaning this test can be used) for the duration of the COVID-19 declaration under Section 564(b)(1) of the Act, 21 U.S.C. section 360bbb-3(b)(1), unless the authorization is terminated or revoked.  Performed at [facility], [address], [place], [place] [address]   Urine Culture     Status: None   Collection Time: 10/05/20 10:15 PM   Specimen: In/Out Cath Urine  Result Value Ref Range Status   Specimen Description   Final    IN/OUT CATH URINE Performed at [facility], [address], [place], [place] [address]  Special Requests   Final    NONE Performed at [facility], [address] [place], [place] [address]    Culture   Final    NO GROWTH Performed at [facility], [place] [address] [place], [place] [address]    Report Status 10/07/2020 FINAL  Final     Serology:   Imaging: If present, new imagings (plain films, ct scans, and mri) have been personally visualized and interpreted; radiology reports have been reviewed. Decision making incorporated into the Impression / Recommendations.   Jabier Mutton, [facility] [place] [phone] pager    10/09/2020, 11:04 AM

## 2020-10-09 NOTE — Progress Notes (Signed)
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 fib not on anticoagulation due to low score, GERD, history of CAD, on Toprol admitted with With neutropenic fever, sepsis with SIRS criteria   In the ED febrile 100.1, hyponatremic neutropenic.  Placed on vancomycin/cefepime. Chest x-ray nonacute de-escalation could be radiation pneumonitis versus superimposed infection, normal lactic acid, UA no evidence of UTI, COVID-19 negative. CT chest: Increasingly conspicuous lymph nodes in the right hilar, mediastinal, lower right cervical and supraclavicular nodal stations concerning for potential metastatic disease, although some of this could be simply reactive . Satellite nodule measuring 11 mm in the right upper lobe, stable compared to the prior study.Right adrenal metastasis. 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 Granix ordered but patient declined given previous intolerance. WBC count recently improving.  Patient found to have Pseudomonas sepsis-based on the sensitivity patient can go on Cipro through 8/28 and oral vancomycin until 9/4 for secondary C Diff prophylaxis.  Subjective: Alert oriented, heart rate at times high-asymptomatic.  Eager to go home No fever.  Assessment & Plan:  Neutropenic fever Pseudomonas sepsis  POA Immunosuppressed on immunotherapy and on long-term steroid for RA No clear source for bacteremia.Appreciate ID input - off vancomycin, s/p cefepime and  ID advised based on the  sensitivity patient can go on Cipro through 8/28 and oral vancomycin until 9/4 for secondary C Diff prophylaxis.  Neutropenia with anemia, thrombocytopenia and neutropenia: Not due to immunotherapy per oncology.  He has predated pancytopenia prior to his immunotherapy likely chronic component.  Drop suspected due to his sepsis also has splenomegaly.  WBC and ANC increasing. Refused Granix due to intolerance. Okay to d/c home.   Hypomagnesemia/Hypokalemia: resolved Hypotension-It has been soft on admission.  Overnight one episode of soft blood pressure.  On low-dose metoprolol for A. fib.  Hyponatremia: resolved.suspect volume depletion   RA on long-term prednisone-now on stress dose hydrocortisone- weaning to home dose.He wants to stick to only 5 mg  SCC stage IV lung SCC with adrenal metastasis on immunotherapy-Dr Ennever ollowing.  CT chest shows lymph nodes in the right hilar mediastinal nor right cervical and supraclavicular node-?Potentially metastatic disease versus reactive lymph nodes.   PAF with RVR- at times hr in 130s-140s with anxiety and minimal activity-at baseline in 100s.  Continue low-dose metoprolol, unable to increase to home due to soft blood pressure.CHADS2VASC2 score is 1 so does not need anticoagulation for a fib ?? Cardio consulted and looking into getting EP recs.   COPD with asthma with chronic wheezing- on steroids already, continue  xoponex prn  HIV Ab 1/2 AB: pending.On admission screening test positive.Likely false positive. I had discussed with Dr Linus Salmons from [place] Room service appropriate? Yes; Fluid consistency: Thin  Diet effective now                         Patient's Body mass index is 29.27 kg/m.  DVT  prophylaxis: XARELTO Code Status:   Code Status: Full Code  Family Communication: plan of care discussed with patient at bedside. Status is: Inpatient  Remains inpatient appropriate because:Inpatient level of  care appropriate due to severity of illness  Dispo: The patient is from: Home              Anticipated d/c is to: Home              Patient currently is not medically stable to d/c.   Difficult to place patient No  Unresulted Labs (From admission, onward)     Start     Ordered   10/10/20 0500  Lipid panel  Tomorrow morning,   R        10/09/20 1108   10/09/20 0500  CBC with Differential/Platelet  Daily,   R      10/08/20 0945   10/06/20 0114  Expectorated Sputum Assessment w Gram Stain, Rflx to Resp Cult  Once,   R        10/06/20 0113            Medications reviewed: Scheduled Meds:  ciprofloxacin  750 mg Oral BID   hydrocortisone sod succinate (SOLU-CORTEF) inj  50 mg Intravenous Q12H   levalbuterol  0.63 mg Nebulization BID   metoprolol tartrate  12.5 mg Oral BID   pantoprazole  40 mg Oral QHS   rivaroxaban  20 mg Oral Q supper   vancomycin  125 mg Oral BID   Continuous Infusions: Consultants:see note  Procedures:see note Antimicrobials: Anti-infectives (From admission, onward)    Start     Dose/Rate Route Frequency Ordered Stop   10/09/20 2200  vancomycin (VANCOCIN) capsule 125 mg        125 mg Oral 2 times daily 10/09/20 1334     10/09/20 1400  ciprofloxacin (CIPRO) tablet 750 mg        750 mg Oral 2 times daily 10/09/20 0903 10/15/20 0759   10/07/20 2200  vancomycin (VANCOCIN) capsule 125 mg  Status:  Discontinued        125 mg Oral 2 times daily 10/07/20 1718 10/09/20 0719   10/06/20 2000  vancomycin (VANCOREADY) IVPB 1500 mg/300 mL  Status:  Discontinued        1,500 mg 150 mL/hr over 120 Minutes Intravenous Every 24 hours 10/05/20 2149 10/06/20 1046   10/06/20 1200  vancomycin (VANCOREADY) IVPB 1250 mg/250 mL  Status:  Discontinued        1,250 mg 166.7 mL/hr over 90 Minutes Intravenous Every 12 hours 10/06/20 1046 10/07/20 1427   10/06/20 0600  ceFEPIme (MAXIPIME) 2 g in sodium chloride 0.9 % 100 mL IVPB  Status:  Discontinued        2 g 200 mL/hr over 30  Minutes Intravenous Every 8 hours 10/05/20 2149 10/09/20 0903   10/05/20 2030  ceFEPIme (MAXIPIME) 2 g in sodium chloride 0.9 % 100 mL IVPB        2 g 200 mL/hr over 30 Minutes Intravenous  Once 10/05/20 2017 10/05/20 2216   10/05/20 2030  vancomycin (VANCOCIN) IVPB 1000 mg/200 mL premix  Status:  Discontinued        1,000 mg 200 mL/hr over 60 Minutes Intravenous  Once 10/05/20 2017 10/05/20 2029   10/05/20 2030  vancomycin (VANCOCIN) IVPB 1000 mg/200 mL premix        1,000 mg 200 mL/hr over 60 Minutes Intravenous Every hour 10/05/20 2029 10/05/20 2319      Culture/Microbiology  Component Value Date/Time   SDES  10/05/2020 2215    IN/OUT CATH URINE Performed at [facility], [address] Mad[place]aine Bhat [place], [place] [address]    [facility]  10/05/2020 2215    NONE Performed at [facility], [address] [place] Bhat [place], [place] [address]    CULT  10/05/2020 2215    NO GROWTH Performed at [facility], [place] [address], [place], [place] [address]    REPTSTATUS 10/07/2020 FINAL 10/05/2020 2215    Other culture-see note  Objective: Vitals: Today's Vitals   10/09/20 0712 10/09/20 0802 10/09/20 1204 10/09/20 1226  BP: 119/70   122/80  Pulse: 96   93  Resp:    16  Temp:    97.8 F (36.6 C)  TempSrc:    Oral  SpO2:  99%  97%  Weight:      Height:      PainSc:   0-No pain     Intake/Output Summary (Last 24 hours) at 10/09/2020 1635 Last data filed at 10/09/2020 1300 Gross per 24 hour  Intake 1400.8 ml  Output --  Net 1400.8 ml   Filed Weights   10/05/20 1956  Weight: 92.5 kg   Weight change:   Intake/Output from previous day: 08/24 0701 - 08/25 0700 In: 2419.5 [P.O.:960; I.V.:1045.1; IV Piggyback:414.4] Out: -  Intake/Output this shift: Total I/O In: 740 [P.O.:740] Out: -  Filed Weights   10/05/20 1956  Weight: 92.5 kg   Examination: X3General exam: AAO Hebert Soho , older than stated age, weak appearing. HEENT:Oral mucosa moist, Ear/Nose  WNL grossly,dentition normal. Respiratory system: bilaterally diminished,no use of accessory muscle, non tender. Cardiovascular system: S1 & S2 +,No JVD. Gastrointestinal system: Abdomen soft, NT,ND, BS+. Nervous System:Alert, awake, moving extremities Extremities: NO edema, distal peripheral pulses palpable.  Skin: No rashes,no icterus. MSK: Normal muscle bulk,tone, power Data Reviewed: I have personally reviewed following labs and imaging studies CBC: Recent Labs  Lab 10/05/20 2040 10/06/20 0430 10/07/20 0443 10/08/20 0508 10/09/20 0437  WBC 1.8* 1.4* 1.3* 1.4* 1.6*  NEUTROABS 0.6* 0.4*  --   --  0.7*  HGB 11.8* 9.3* 9.0* 9.1* 9.9*  HCT 34.7* 28.3* 27.2* 28.5* 30.3*  MCV 87.2 89.6 89.2 91.3 90.7  PLT 175 132* 160 181 518   Basic Metabolic Panel: Recent Labs  Lab 10/05/20 2040 10/06/20 0139 10/06/20 0430 10/07/20 0443 10/08/20 0508 10/09/20 0437  NA 129*  --  133* 138 138 140  K 3.9  --  3.8 3.6 3.2* 3.5  CL 95*  --  102 109 113* 112*  CO2 25  --  25 22 22 23   GLUCOSE 115*  --  118* 153* 130* 138*  BUN 14  --  14 17 12 11   CREATININE 1.27*  --  1.01 1.12 0.80 0.93  CALCIUM 8.3*  --  7.9* 8.0* 7.8* 8.1*  MG  --  1.5* 1.5* 2.1  --   --   PHOS  --  3.9  --   --   --   --    GFR: Estimated Creatinine Clearance: 97.7 mL/min (by C-G formula based on SCr of 0.93 mg/dL). Liver Function Tests: Recent Labs  Lab 10/05/20 2040 10/06/20 0430 10/07/20 0443 10/08/20 0508  AST 36 27 19 18   ALT 26 23 21 22   ALKPHOS 74 53 46 46  BILITOT 1.3* 1.3* 0.8 0.4  PROT 6.8 5.7* 5.5* 5.3*  ALBUMIN 2.9* 2.4* 2.3* 2.2*   No results  for input(s): LIPASE, AMYLASE in the last 168 hours. No results for input(s): AMMONIA in the last 168 hours. Coagulation Profile: Recent Labs  Lab 10/05/20 2040  INR 1.1   Cardiac Enzymes: No results for input(s): CKTOTAL, CKMB, CKMBINDEX, TROPONINI in the last 168 hours. BNP (last 3 results) No results for input(s): PROBNP in the last 8760  hours. HbA1C: No results for input(s): HGBA1C in the last 72 hours. CBG: Recent Labs  Lab 10/07/20 1117  GLUCAP 134*   Lipid Profile: No results for input(s): CHOL, HDL, LDLCALC, TRIG, CHOLHDL, LDLDIRECT in the last 72 hours. Thyroid Function Tests: No results for input(s): TSH, T4TOTAL, FREET4, T3FREE, THYROIDAB in the last 72 hours. Anemia Panel: No results for input(s): VITAMINB12, FOLATE, FERRITIN, TIBC, IRON, RETICCTPCT in the last 72 hours. Sepsis Labs: Recent Labs  Lab 10/05/20 2040 10/06/20 0046 10/06/20 0139 10/07/20 0443  PROCALCITON  --   --  7.93 5.34  LATICACIDVEN 1.7 1.0  --   --     Recent Results (from the past 240 hour(s))  Blood Culture (routine x 2)     Status: Abnormal   Collection Time: 10/05/20  8:17 PM   Specimen: Right Antecubital; Blood  Result Value Ref Range Status   Specimen Description   Final    RIGHT ANTECUBITAL Performed at [facility], [address] [place], [place] [address]    Special Requests   Final    BOTTLES DRAWN AEROBIC AND ANAEROBIC Blood Culture adequate volume Performed at [facility], [address] [place], [place] [address]    Culture  Setup Time   Final    GRAM NEGATIVE RODS AEROBIC BOTTLE ONLY CRITICAL RESULT CALLED TO, READ BACK BY AND VERIFIED WITH: E JACKSON,[id number]  10/06/21 [place] Performed at [facility], [place] [address], [place], [place] [address]    Culture PSEUDOMONAS AERUGINOSA (A)  Final   Report Status 10/09/2020 FINAL  Final   Organism ID, Bacteria PSEUDOMONAS AERUGINOSA  Final      Susceptibility   Pseudomonas aeruginosa - MIC*    CEFTAZIDIME 4 SENSITIVE Sensitive     CIPROFLOXACIN <=0.25 SENSITIVE Sensitive     GENTAMICIN <=1 SENSITIVE Sensitive     IMIPENEM 2 SENSITIVE Sensitive     PIP/TAZO 8 SENSITIVE Sensitive     CEFEPIME 2 SENSITIVE Sensitive     * PSEUDOMONAS AERUGINOSA  Blood Culture ID Panel (Reflexed)     Status: Abnormal   Collection Time: 10/05/20  8:17  PM  Result Value Ref Range Status   Enterococcus faecalis NOT DETECTED NOT DETECTED Final   Enterococcus Faecium NOT DETECTED NOT DETECTED Final   Listeria monocytogenes NOT DETECTED NOT DETECTED Final   Staphylococcus species NOT DETECTED NOT DETECTED Final   Staphylococcus aureus (BCID) NOT DETECTED NOT DETECTED Final   Staphylococcus epidermidis NOT DETECTED NOT DETECTED Final   Staphylococcus lugdunensis NOT DETECTED NOT DETECTED Final   Streptococcus species NOT DETECTED NOT DETECTED Final   Streptococcus agalactiae NOT DETECTED NOT DETECTED Final   Streptococcus pneumoniae NOT DETECTED NOT DETECTED Final   Streptococcus pyogenes NOT DETECTED NOT DETECTED Final   A.calcoaceticus-baumannii NOT DETECTED NOT DETECTED Final   Bacteroides fragilis NOT DETECTED NOT DETECTED Final   Enterobacterales NOT DETECTED NOT DETECTED Final   Enterobacter cloacae complex NOT DETECTED NOT DETECTED Final   Escherichia coli NOT DETECTED NOT DETECTED Final   Klebsiella aerogenes NOT DETECTED NOT DETECTED Final   Klebsiella oxytoca NOT DETECTED NOT DETECTED Final  Klebsiella pneumoniae NOT DETECTED NOT DETECTED Final   Proteus species NOT DETECTED NOT DETECTED Final   Salmonella species NOT DETECTED NOT DETECTED Final   Serratia marcescens NOT DETECTED NOT DETECTED Final   Haemophilus influenzae NOT DETECTED NOT DETECTED Final   Neisseria meningitidis NOT DETECTED NOT DETECTED Final   Pseudomonas aeruginosa DETECTED (A) NOT DETECTED Final    Comment: CRITICAL RESULT CALLED TO, READ BACK BY AND VERIFIED WITH: E JACKSON,[email or web address]@[id number]  10/07/20 [place]    Stenotrophomonas maltophilia NOT DETECTED NOT DETECTED Final   Candida albicans NOT DETECTED NOT DETECTED Final   Candida auris NOT DETECTED NOT DETECTED Final   Candida glabrata NOT DETECTED NOT DETECTED Final   Candida krusei NOT DETECTED NOT DETECTED Final   Candida parapsilosis NOT DETECTED NOT DETECTED Final   Candida tropicalis NOT DETECTED NOT DETECTED  Final   Cryptococcus neoformans/gattii NOT DETECTED NOT DETECTED Final   CTX-M ESBL NOT DETECTED NOT DETECTED Final   Carbapenem resistance IMP NOT DETECTED NOT DETECTED Final   Carbapenem resistance KPC NOT DETECTED NOT DETECTED Final   Carbapenem resistance NDM NOT DETECTED NOT DETECTED Final   Carbapenem resistance VIM NOT DETECTED NOT DETECTED Final    Comment: Performed at [facility], [address], [place], [place] [address]  Blood Culture (routine x 2)     Status: None (Preliminary result)   Collection Time: 10/05/20  8:22 PM   Specimen: BLOOD RIGHT HAND  Result Value Ref Range Status   Specimen Description   Final    BLOOD RIGHT HAND Performed at [facility], [address] [place], [place] [address]    Special Requests   Final    BOTTLES DRAWN AEROBIC AND ANAEROBIC Blood Culture results may not be optimal due to an inadequate volume of blood received in culture bottles Performed at [facility], [address] [place], [place] [address]    Culture   Final    NO GROWTH 3 DAYS Performed at [facility], [place] [address] [place], [place] [address]    Report Status PENDING  Incomplete  Resp Panel by RT-PCR (Flu A&B, Covid) Nasopharyngeal Swab     Status: None   Collection Time: 10/05/20  8:40 PM   Specimen: Nasopharyngeal Swab; Nasopharyngeal(NP) swabs in vial transport medium  Result Value Ref Range Status   SARS Coronavirus 2 by RT PCR NEGATIVE NEGATIVE Final    Comment: (NOTE) SARS-CoV-2 target nucleic acids are NOT DETECTED.  The SARS-CoV-2 RNA is generally detectable in upper respiratory specimens during the acute phase of infection. The lowest concentration of SARS-CoV-2 viral copies this assay can detect is 138 copies/mL. A negative result does not preclude SARS-Cov-2 infection and should not be used as the sole basis for treatment or other patient management decisions. A negative result may occur with  improper specimen  collection/handling, submission of specimen other than nasopharyngeal swab, presence of viral mutation(s) within the areas targeted by this assay, and inadequate number of viral copies(<138 copies/mL). A negative result must be combined with clinical observations, patient history, and epidemiological information. The expected result is Negative.  Fact Sheet for Patients:  EntrepreneurPulse.com.au  Fact Sheet for Healthcare Providers:  IncredibleEmployment.be  This test is no t yet approved or cleared by the [place] FDA and  has been authorized for detection and/or diagnosis of SARS-CoV-2 by FDA under an Emergency Use Authorization (EUA). This EUA will remain  in effect (meaning this test can be used) for  the duration of the COVID-19 declaration under Section 564(b)(1) of the Act, 21 U.S.C.section 360bbb-3(b)(1), unless the authorization is terminated  or revoked sooner.       Influenza A by PCR NEGATIVE NEGATIVE Final   Influenza B by PCR NEGATIVE NEGATIVE Final    Comment: (NOTE) The Xpert Xpress SARS-CoV-2/FLU/RSV plus assay is intended as an aid in the diagnosis of influenza from Nasopharyngeal swab specimens and should not be used as a sole basis for treatment. Nasal washings and aspirates are unacceptable for Xpert Xpress SARS-CoV-2/FLU/RSV testing.  Fact Sheet for Patients: EntrepreneurPulse.com.au  Fact Sheet for Healthcare Providers: IncredibleEmployment.be  This test is not yet approved or cleared by the [place] FDA and has been authorized for detection and/or diagnosis of SARS-CoV-2 by FDA under an Emergency Use Authorization (EUA). This EUA will remain in effect (meaning this test can be used) for the duration of the COVID-19 declaration under Section 564(b)(1) of the Act, 21 U.S.C. section 360bbb-3(b)(1), unless the authorization is terminated or revoked.  Performed at [facility], [address] [place], [place] [address]   Urine Culture     Status: None   Collection Time: 10/05/20 10:15 PM   Specimen: In/Out Cath Urine  Result Value Ref Range Status   Specimen Description   Final    IN/OUT CATH URINE Performed at [facility], [address] [place], [place] [address]    Special Requests   Final    NONE Performed at [facility], [address] [place], [place] [address]    Culture   Final    NO GROWTH Performed at [facility], [place] [address], [place], [place] [address]    Report Status 10/07/2020 FINAL  Final     Radiology Studies: No results found.   LOS: 3 days   Antonieta Pert, MD [facility]  10/09/2020, 4:35 PM

## 2020-10-09 NOTE — Progress Notes (Signed)
[name] is doing okay this morning.  He is hoping he can go home.  The sensitivities came back on the Pseudomonas.  It is sensitive to ciprofloxacin.  ID is following him closely.  He can be on oral ciprofloxacin for a few days.  He has had no fever.  He says that the Maxipime does cause a lot of abdominal issues for him.  I think that the oral vancomycin may also be causing some abdominal issues for him.  He has had no vomiting.  He has had no diarrhea.  His CBC today shows white count of 1.6.  His hemoglobin is 9.9.  Platelet count 197,000.  His BUN is 11 and creatinine 0.93.  We put him on Xarelto for the atrial fibrillation.  It sounds like he does not need to be on anticoagulation for the atrial fibrillation.  Cardiology is going to follow-up on this.  He has had no cough.  He has had no leg swelling.  He has had no rashes.  His vital signs show temperature of 98.  Pulse 96.  Blood pressure 119/70.  His lungs are clear bilaterally.  Cardiac exam regular rate and rhythm.  He has no murmurs.  Abdomen is soft.  Bowel sounds are present.  There are no palpable liver or spleen tip.  Extremity shows no clubbing, cyanosis or edema.  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  I do still think there is much we can do about this.  Hopefully will go home today.  I am sure he will be happy about this.  I know that he has had incredible care from the hard-working staff up on [address].   Pete Leia Coletti,MD  2 Timothy 1:7

## 2020-10-09 NOTE — Consult Note (Addendum)
Cardiology Consultation:   Patient ID: John [name] MRN: [id number]; DOB: [birth year]  Admit date: 10/05/2020 Date of Consult: 10/09/2020  PCP:  [name], MD   [facility] Providers Cardiologist:  Fransico Him, MD  and Afib clinic Click here to update MD or APP on Care Team, Refresh:1}     Patient Profile:   John [name] is a 60 y.o. 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 Lupita Leash.  History of Present Illness:   John [name] was initially dx with afib in 04/2020 after presenting to the ED with generalized weakness having recently started chemotherapy for his lung CA. He was tachycardic in rapid atrial fib. He was hospitalized for further management of C diff diarrhea, sepsis and febrile neuropenia. Anticoagulation was deferred due to elevated bleeding risk with tumor pressing on pulmonary artery, also CHADSVASC 0.. He was started on amiodarone which converted him to NSR. 2D Echo 05/12/20 showed EF 60-65%, no RWMa, no significant valve disease. There was a concern about lung toxicity with either amiodarone or Keytruda on CT 07/18/20 so amiodarone was stopped at 08/25/20 afib clinic visit, with plan to consider alternate drug after 3 month washout. He was continued on Lopressor.  He has been readmitted with fever and vomiting. He was found to have pseudomonal bacteremia, neuropenic fever, hypomagnesemia, hypokalemia, hyponatremia, and hypotension. He reports following his HR at home without any recent breakthrough afib events. He states 30 minutes after getting albuterol breathing treatment he developed AF RVR. Was given 2.5mg  IV metoprolol. Home dose of oral metoprolol was decreased to 12.5mg  BID on arrival in context of hypotension. TSH wnl. HR remains elevated 100s-150s but he is  feeling much better. He is totally unaware of any palpitations and has not had any CP or SOB. He did notice mild ankle edema starting today which he attributes to being in the hospital.  Also during admit had abnormal CT scan and abnormal HIV screen. He is pending confirmatory HIV test, felt to be false positive. Heme-onc and ID are following him. [name] started Xarelto for his atrial fib because he feels the patient is at increased thromboembolic risk due to his cancer.  CT chest came back with findings: 1. Increasingly conspicuous lymph nodes in the right hilar, mediastinal, lower right cervical and supraclavicular nodal stations concerning for potential metastatic disease, although some of this could be simply reactive in the setting of evolving postradiation changes in the right lung. Attention on follow-up studies is recommended. Treated right upper lobe lesion is once again cavitary and less apparent than on the prior examination. 2. Satellite nodule measuring 11 mm in the right upper lobe, stable compared to the prior study. 3. Right adrenal metastasis, similar to the prior study. 4. 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 5. Cholelithiasis. 6. Aortic atherosclerosis, in addition to 2 vessel coronary artery disease.    Past Medical History:  Diagnosis Date   Dyspnea    Family history of adverse reaction to anesthesia    brother with seizures had episode under anesthesia.  Patient has seizures as well.   GERD (gastroesophageal reflux disease)    History of radiation therapy 04/24/2020-05/16/2020   IMRT to right lung     Dr [name]   PAF (paroxysmal atrial fibrillation) (HCC)    CHADS2VSAC score 0   Rheumatoid aortitis  Squamous cell lung cancer [facility])     Past Surgical History:  Procedure Laterality Date   BRONCHIAL BRUSHINGS  03/27/2020   Procedure: BRONCHIAL BRUSHINGS;  Surgeon: [name], MD;  Location: [facility];  Service: Cardiopulmonary;;    CATARACT EXTRACTION  2016   at [facility]  03/27/2020   Procedure: FINE NEEDLE ASPIRATION;  Surgeon: [name], MD;  Location: [facility];  Service: Cardiopulmonary;;   HIP SURGERY Left    VIDEO BRONCHOSCOPY WITH ENDOBRONCHIAL ULTRASOUND N/A 03/27/2020   Procedure: VIDEO BRONCHOSCOPY WITH ENDOBRONCHIAL ULTRASOUND;  Surgeon: [name], MD;  Location: [facility];  Service: Cardiopulmonary;  Laterality: N/A;     Home Medications:  Prior to Admission medications   Medication Sig Start Date End Date Taking? Authorizing Provider  acetaminophen (TYLENOL) 500 MG tablet Take 500 mg by mouth every 6 (six) hours as needed for mild pain or moderate pain.   Yes [provider]  levalbuterol (XOPENEX HFA) 45 MCG/ACT inhaler Inhale 2 puffs into the lungs every 4 (four) hours as needed for wheezing. 06/11/20  Yes [name], MD  metoprolol tartrate (LOPRESSOR) 25 MG tablet Take 1 tablet (25 mg total) by mouth 2 (two) times daily. 06/27/20  Yes Turner, Eber Hong, MD  PARoxetine (PAXIL) 10 MG tablet Take 10 mg by mouth daily. 01/25/20  Yes [provider]  predniSONE (DELTASONE) 5 MG tablet Take 5-10 mg by mouth daily. 09/01/20  Yes [provider]  PRILOSEC OTC 20 MG tablet Take 20 mg by mouth at bedtime. 11/13/19  Yes [provider]    Inpatient Medications: Scheduled Meds:  ciprofloxacin  750 mg Oral BID   hydrocortisone sod succinate (SOLU-CORTEF) inj  50 mg Intravenous Q12H   levalbuterol  0.63 mg Nebulization BID   metoprolol tartrate  12.5 mg Oral BID   pantoprazole  40 mg Oral QHS   rivaroxaban  20 mg Oral Q supper   Continuous Infusions:  PRN Meds: acetaminophen **OR** acetaminophen, levalbuterol, magic mouthwash  Allergies:    Allergies  Allergen Reactions   Albuterol     Patient has had episode of atrial fib following administration of albuterol (tolerates Xopenex well)    Social History:   Social History    Socioeconomic History   Marital status: Married    Spouse name: Not on file   Number of children: Not on file   Years of education: Not on file   Highest education level: Not on file  Occupational History   Not on file  Tobacco Use   Smoking status: Former    Packs/day: 1.00    Years: 30.00    Pack years: 30.00    Types: Cigarettes    Quit date: 2018    Years since quitting: 4.6   Smokeless tobacco: Never  Vaping Use   Vaping Use: Never used  Substance and Sexual Activity   Alcohol use: Not Currently   Drug use: Never   Sexual activity: Not on file  Other Topics Concern   Not on file  Social History Narrative   Not on file   Social Determinants of Health   Financial Resource Strain: Not on file  Food Insecurity: Not on file  Transportation Needs: Not on file  Physical Activity: Not on file  Stress: Not on file  Social Connections: Not on file  Intimate Partner Violence: Not on file    Family History:    Family History  Problem Relation Age of  Onset   Arrhythmia Mother        has PPM   Heart disease Father        Died at 37, started in his 81s, heart attacks, had PPM and ICD     ROS:  Please see the history of present illness.   All other ROS reviewed and negative.     Physical Exam/Data:   Vitals:   10/09/20 0544 10/09/20 0546 10/09/20 0712 10/09/20 0802  BP: (!) 87/56 99/77 119/70   Pulse:  (!) 103 96   Resp:  18    Temp:  98 F (36.7 C)    TempSrc:  Oral    SpO2:  97%  99%  Weight:      Height:        Intake/Output Summary (Last 24 hours) at 10/09/2020 1110 Last data filed at 10/09/2020 0600 Gross per 24 hour  Intake 914.37 ml  Output --  Net 914.37 ml   Last 3 Weights 10/05/2020 09/30/2020 09/22/2020  Weight (lbs) 204 lb 208 lb 204 lb  Weight (kg) 92.534 kg 94.348 kg 92.534 kg     Body mass index is 29.27 kg/m.  General: Well developed, well nourished, in no acute distress. Head: Normocephalic, atraumatic, sclera non-icteric, no  xanthomas, nares are without discharge. Neck: Negative for carotid bruits. JVP not elevated. Lungs: Diminished BS at bases, right lower lobe wheezing cleared with cough. No rhonchi, otherwise no scattered wheezing. Breathing is unlabored. Heart: Tachycardic, regular S1 S2 without murmurs, rubs, or gallops.  Abdomen: Soft, non-tender, non-distended with normoactive bowel sounds. No rebound/guarding. Extremities: No clubbing or cyanosis. Trace ankle edema. Distal pedal pulses are 2+ and equal bilaterally. Neuro: Alert and oriented X 3. Moves all extremities spontaneously. Psych:  Responds to questions appropriately with a normal affect.    EKG:  The EKG was personally reviewed and demonstrates:   Multiple tracings reviewed - initially sinus tach 105bpm, LAFB, poor R wave progression, RSR pattern V1, nonspecific changes F/u tracing 8/22 atrial fib RSR pattern V1, LAFB, nonspecific changes, some fusion beat morphologies  Telemetry:  Telemetry was personally reviewed and demonstrates:  as noted above  Relevant CV Studies: 2D echo 04/2020   1. Left ventricular ejection fraction, by estimation, is 60 to 65%. The  left ventricle has normal function. The left ventricle has no regional  wall motion abnormalities. Left ventricular diastolic parameters are  indeterminate.   2. Right ventricular systolic function is normal. The right ventricular  size is normal. Tricuspid regurgitation signal is inadequate for assessing  PA pressure.   3. The mitral valve is normal in structure. Trivial mitral valve  regurgitation. No evidence of mitral stenosis.   4. The aortic valve is tricuspid. Aortic valve regurgitation is not  visualized. No aortic stenosis is present.   5. The inferior vena cava is normal in size with greater than 50%  respiratory variability, suggesting right atrial pressure of 3 mmHg.   6. The patient is in atrial fibrillation.  Laboratory Data:  High Sensitivity Troponin:  No results  for input(s): TROPONINIHS in the last 720 hours.   Chemistry Recent Labs  Lab 10/07/20 0443 10/08/20 0508 10/09/20 0437  NA 138 138 140  K 3.6 3.2* 3.5  CL 109 113* 112*  CO2 22 22 23   GLUCOSE 153* 130* 138*  BUN 17 12 11   CREATININE 1.12 0.80 0.93  CALCIUM 8.0* 7.8* 8.1*  GFRNONAA >60 >60 >60  ANIONGAP 7 3* 5    Recent Labs  Lab 10/06/20 0430 10/07/20 0443 10/08/20 0508  PROT 5.7* 5.5* 5.3*  ALBUMIN 2.4* 2.3* 2.2*  AST 27 19 18   ALT 23 21 22   ALKPHOS 53 46 46  BILITOT 1.3* 0.8 0.4   Hematology Recent Labs  Lab 10/07/20 0443 10/08/20 0508 10/09/20 0437  WBC 1.3* 1.4* 1.6*  RBC 3.05* 3.12* 3.34*  HGB 9.0* 9.1* 9.9*  HCT 27.2* 28.5* 30.3*  MCV 89.2 91.3 90.7  MCH 29.5 29.2 29.6  MCHC 33.1 31.9 32.7  RDW 16.5* 16.6* 16.3*  PLT 160 181 197   BNPNo results for input(s): BNP, PROBNP in the last 168 hours.  DDimer No results for input(s): DDIMER in the last 168 hours.   Radiology/Studies:  CT CHEST W CONTRAST  Result Date: 10/06/2020 CLINICAL DATA:  60 year old male with history of non-small cell lung cancer. Evaluate for treatment response. EXAM: CT CHEST WITH CONTRAST TECHNIQUE: Multidetector CT imaging of the chest was performed during intravenous contrast administration. CONTRAST:  61mL OMNIPAQUE IOHEXOL 350 MG/ML SOLN COMPARISON:  Chest CT 07/18/2020. FINDINGS: Cardiovascular: Heart size is normal. There is no significant pericardial fluid, thickening or pericardial calcification. There is aortic atherosclerosis, as well as atherosclerosis of the great vessels of the mediastinum and the coronary arteries, including calcified atherosclerotic plaque in the left anterior descending and left circumflex coronary arteries. Mediastinum/Nodes: Multiple prominent borderline enlarged and mildly enlarged mediastinal and right hilar lymph nodes are noted, largest of which is in the subcarinal nodal station measuring 1.5 cm in short axis. There also multiple prominent but  nonenlarged low right cervical and right supraclavicular lymph nodes, conspicuous by their number, which appear slightly increased compared to the prior examination. Esophagus is unremarkable in appearance. No axillary lymphadenopathy. Lungs/Pleura: Treated mass in the posterior aspect of the right upper lobe is largely obscured, now a more subtle area of cavitation (axial image 56 of series 5), currently measuring 3.9 x 2.6 cm. Extensive surrounding consolidative changes with associated ground-glass attenuation and septal thickening in the adjacent portions of the right lung, which likely reflect evolving postradiation changes. Satellite nodule in the right upper lobe (axial image 40 of series 5) measuring 11 mm, similar to the prior examination. Scattered areas of septal thickening are noted elsewhere in the lungs bilaterally. Mild diffuse bronchial wall thickening with mild centrilobular and paraseptal emphysema. Increasing pleural thickening and trace volume of pleural fluid in the right hemithorax. No left pleural effusion. Upper Abdomen: Large right adrenal mass again noted, currently measuring 4.7 x 3.3 cm, compatible with a metastatic lesion. Spleen is incompletely imaged, but appears likely enlarged measuring up to 15.0 x 8.5 cm on axial images. Again noted is a wedge-shaped area of low attenuation in the lateral aspect of the lower spleen, most compatible with an old splenic infarct. Calcified gallstone measuring 1.9 cm in the neck of the gallbladder. Musculoskeletal: There are no aggressive appearing lytic or blastic lesions noted in the visualized portions of the skeleton. IMPRESSION: 1. Increasingly conspicuous lymph nodes in the right hilar, mediastinal, lower right cervical and supraclavicular nodal stations concerning for potential metastatic disease, although some of this could be simply reactive in the setting of evolving postradiation changes in the right lung. Attention on follow-up studies is  recommended. Treated right upper lobe lesion is once again cavitary and less apparent than on the prior examination. 2. Satellite nodule measuring 11 mm in the right upper lobe, stable compared to the prior study. 3. Right adrenal metastasis, similar to the prior study. 4. 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 5. Cholelithiasis. 6. Aortic atherosclerosis, in addition to 2 vessel coronary artery disease. Please note that although the presence of coronary artery calcium documents the presence of coronary artery disease, the severity of this disease and any potential stenosis cannot be assessed on this non-gated CT examination. Assessment for potential risk factor modification, dietary therapy or pharmacologic therapy may be warranted, if clinically indicated. Aortic Atherosclerosis (ICD10-I70.0). Electronically Signed   By: Vinnie Langton M.D.   On: 10/06/2020 09:45   DG Chest Port 1 View  Result Date: 10/05/2020 CLINICAL DATA:  Sepsis, lung cancer EXAM: PORTABLE CHEST 1 VIEW COMPARISON:  05/12/2020, CT 07/18/2020 FINDINGS: There is dense consolidation within the right mid lung zone, progressive when compared to prior CT examination, with associated right-sided volume loss. Left lung is clear. No pneumothorax or pleural effusion. Cardiac size is within normal limits. No acute bone abnormality. IMPRESSION: Progressive, dense consolidation within the right mid lung zone. This may reflect progressive consolidative change in the setting of radiation pneumonitis,, progressive changes of post treatment fibrosis, or superimposed infection. Electronically Signed   By: Fidela Salisbury M.D.   On: 10/05/2020 21:04     Assessment and Plan:   1. Neutropenic fever with pseudomonal bacteremia/sepsis - multiple issues on admission including abnormal CBC, electrolyte abnormalities, hypoalbuminemia - CT findings as noted - HIV screen followup pending - management per primary team  2. Paroxysmal atrial fib  RVR  - patient feels incited by albuterol rx, feeling better with Xopenex - also has known pre-disposition to atrial fib as noted before and also in the context of numerous physiologic stressors so challenging to know  - have added albuterol to list of intolerances to avoid in the future in lieu of Xopenex - [name] has recommended initiation of anticoagulation due to his increased thromboembolic risk in context of his cancer (CHADSVASC previously zero but seems to be at least 1 based on presence of atherosclerosis on CT)  - patient does not want to go back on amiodarone, also some concern for amio vs Keytruda toxicity on 07/2020 CT per Afib APP - baseline QTC on sinus EKGs is >469ms so does not seem dofetilide is an option - consider OP sleep study when clinically improved - reports +++snorning prior to losing weight with chemo but he's not sure about recently - will review plans with MD - fortunately relatively asymptomatic with this  3. Aortic atherosclerosis, coronary atherosclerosis - incidental pickup on CT (not unusual finding, but taking note given CHADSVASC score) - no ischemic symptoms - check lipids in AM and consider statin rx if it will not interfere with other medication regimen  Remainder of medical issues per IM.   Risk Assessment/Risk Scores:          CHA2DS2-VASc Score = 1  This indicates a 0.6% annual risk of stroke. The patient's score is based upon: CHF History: No HTN History: No Diabetes History: No Stroke History: No Vascular Disease History: Yes -  Atherosclerosis on CT Age Score: 0 Gender Score: 0    For questions or updates, please contact [place] Please consult [email or web address] for contact info under    Signed, Charlie Pitter, PA-C  10/09/2020 11:10 AM

## 2020-10-09 NOTE — Progress Notes (Signed)
Entered in error

## 2020-10-09 NOTE — Progress Notes (Signed)
Pharmacy Antibiotic Note  John [name] is a 60 y.o. male admitted on 10/05/2020 with sepsis, febrile neutropenia, pseudomonas bacteremia.  Pharmacy has been consulted for Cefepime dosing. ID is following.  Day #5 Cefepime - WBC 1.6, ANC 700,  - Tm 101.8 on 8/21- afebrile since.  - PCT 7.93 >> 5.34 - SCr 0.93, CrCl ~95 ml/min  Plan: -Continue cefepime 2 gm IV Q 8 hours  -Monitor CBC, renal fx, cultures and clinical progress  Height: 5\' 10"  (177.8 cm) Weight: 92.5 kg (204 lb) IBW/kg (Calculated) : 73  Temp (24hrs), Avg:97.9 F (36.6 C), Min:97.6 F (36.4 C), Max:98.2 F (36.8 C)  Recent Labs  Lab 10/05/20 2040 10/06/20 0046 10/06/20 0430 10/07/20 0443 10/08/20 0508 10/09/20 0437  WBC 1.8*  --  1.4* 1.3* 1.4* 1.6*  CREATININE 1.27*  --  1.01 1.12 0.80 0.93  LATICACIDVEN 1.7 1.0  --   --   --   --      Estimated Creatinine Clearance: 97.7 mL/min (by C-G formula based on SCr of 0.93 mg/dL).    No Known Allergies  Antimicrobials this admission:  Vancomycin IV 8/21 >> 8/23 Cefepime 8/21 >>  ID started vanc PO since hx of Cdiff 8/23 >> 8/25  Dose adjustments this admission:  N/A  Microbiology results:  8/21 BCx: 1/4 pseudomonas, no R 8/21 UCx: NGF 8/22 MRSA PCR: cancelled  Thank you for allowing pharmacy to be a part of this patient's care.  Peggyann Juba, PharmD, BCPS Pharmacy: [phone]  10/09/2020 8:21 AM

## 2020-10-10 ENCOUNTER — Inpatient Hospital Stay (HOSPITAL_COMMUNITY): Payer: 59

## 2020-10-10 DIAGNOSIS — B965 Pseudomonas (aeruginosa) (mallei) (pseudomallei) as the cause of diseases classified elsewhere: Secondary | ICD-10-CM | POA: Diagnosis not present

## 2020-10-10 DIAGNOSIS — D709 Neutropenia, unspecified: Secondary | ICD-10-CM | POA: Diagnosis not present

## 2020-10-10 DIAGNOSIS — R5081 Fever presenting with conditions classified elsewhere: Secondary | ICD-10-CM | POA: Diagnosis not present

## 2020-10-10 DIAGNOSIS — I4891 Unspecified atrial fibrillation: Secondary | ICD-10-CM | POA: Diagnosis not present

## 2020-10-10 DIAGNOSIS — R7881 Bacteremia: Secondary | ICD-10-CM | POA: Diagnosis not present

## 2020-10-10 DIAGNOSIS — I480 Paroxysmal atrial fibrillation: Secondary | ICD-10-CM | POA: Diagnosis not present

## 2020-10-10 DIAGNOSIS — C3491 Malignant neoplasm of unspecified part of right bronchus or lung: Secondary | ICD-10-CM | POA: Diagnosis not present

## 2020-10-10 LAB — CBC WITH DIFFERENTIAL/PLATELET
Abs Immature Granulocytes: 0 10*3/uL (ref 0.00–0.07)
Basophils Absolute: 0 10*3/uL (ref 0.0–0.1)
Basophils Relative: 0 %
Eosinophils Absolute: 0 10*3/uL (ref 0.0–0.5)
Eosinophils Relative: 0 %
HCT: 34 % — ABNORMAL LOW (ref 39.0–52.0)
Hemoglobin: 11.1 g/dL — ABNORMAL LOW (ref 13.0–17.0)
Lymphocytes Relative: 37 %
Lymphs Abs: 0.7 10*3/uL (ref 0.7–4.0)
MCH: 29.5 pg (ref 26.0–34.0)
MCHC: 32.6 g/dL (ref 30.0–36.0)
MCV: 90.4 fL (ref 80.0–100.0)
Monocytes Absolute: 0.5 10*3/uL (ref 0.1–1.0)
Monocytes Relative: 29 %
Neutro Abs: 0.6 10*3/uL — ABNORMAL LOW (ref 1.7–7.7)
Neutrophils Relative %: 34 %
Platelets: 218 10*3/uL (ref 150–400)
RBC: 3.76 MIL/uL — ABNORMAL LOW (ref 4.22–5.81)
RDW: 16.1 % — ABNORMAL HIGH (ref 11.5–15.5)
WBC: 1.8 10*3/uL — ABNORMAL LOW (ref 4.0–10.5)
nRBC: 0 % (ref 0.0–0.2)

## 2020-10-10 LAB — LIPID PANEL
Cholesterol: 133 mg/dL (ref 0–200)
HDL: 22 mg/dL — ABNORMAL LOW (ref >40)
LDL Cholesterol: 94 mg/dL (ref 0–99)
Total CHOL/HDL Ratio: 6 RATIO
Triglycerides: 87 mg/dL (ref <150)
VLDL: 17 mg/dL (ref 0–40)

## 2020-10-10 MED ORDER — PREDNISONE 5 MG PO TABS
10.0000 mg | ORAL_TABLET | Freq: Every day | ORAL | Status: DC
  Administered 2020-10-10 – 2020-10-14 (×4): 10 mg via ORAL
  Filled 2020-10-10 (×4): qty 2

## 2020-10-10 MED ORDER — METOPROLOL TARTRATE 25 MG PO TABS
12.5000 mg | ORAL_TABLET | Freq: Once | ORAL | Status: DC
  Filled 2020-10-10 (×2): qty 1

## 2020-10-10 MED ORDER — AMIODARONE HCL 200 MG PO TABS
200.0000 mg | ORAL_TABLET | Freq: Two times a day (BID) | ORAL | Status: DC
  Administered 2020-10-10 (×2): 200 mg via ORAL
  Filled 2020-10-10 (×2): qty 1

## 2020-10-10 NOTE — Progress Notes (Signed)
John [name] is becoming frustrated.  I hate this for him.  He really wants to go home.  He says that the nebulizer is caused him to cough more.  He does not like being on the IV hydrocortisone.  Thankfully, he is on ciprofloxacin now.  He is back on oral vancomycin.  Hopefully, he will be on these for a few days.  He is still in atrial fibrillation.  He is back on Xarelto.  Cardiology is seeing him.  He is on metoprolol.  He wants to be on amiodarone for a couple days because he says this will get his heart back into rhythm.  He has had no problems with diarrhea.  He has had no issues with pain.  His white cell count is 1.8.  His hemoglobin 11.1.  Platelet count 218,000.  On his physical exam, his temperature was 99.  Pulse is 101.  Blood pressure 118/85.  Oxygen saturation is 96%.  His lungs are clear.  There is no wheezing.  Cardiac exam is tachycardic and irregular.  Abdomen is soft.  I just feel bad that John [name] is still in the hospital.  I know that the atrial fibrillation is a problem for him.  It be nice to get him back to normal sinus rhythm.  He is convinced that amiodarone will do this.  I will get him back onto oral prednisone.  Hopefully this will make him a little bit more satisfied and not have to have the IV hydrocortisone.  I do appreciate the great care he is getting from the staff of [facility].  I know this is quite complicated.  I appreciate their professional care.  [name], [place]

## 2020-10-10 NOTE — Progress Notes (Signed)
Pulmonology note reviewed, appreciate input. Dr. Radford Pax recommends to initiate oral amiodarone at 200mg  BID. Order written.

## 2020-10-10 NOTE — Consult Note (Signed)
NAME:  John [name], MRN:  [id number], DOB:  [birth year], LOS: 4 ADMISSION DATE:  10/05/2020, CONSULTATION DATE:  8/26 REFERRING MD:  Radford Pax, CHIEF COMPLAINT:  fatigue, fever   History of Present Illness:  60 y/o male with squamous cell carcinoma admitted to [facility] with neutropenic fever due to pseudomonas bacteremia.  PCCM consulted on 8/26 to assess whether or not it would be OK for him to take amiodarone to treat his atrial fibrillation.    He was diagnosed with squamous cell lung cancer by Dr. Brock Ra via bronchoscopy on 03/27/2020, then confirmed to have stage IV disease on 3/22 with a biopsy of his right adrenal gland which showed metastasis.  He has been treated with radiation therapy and keytruda in addition to chemotherapy (paclitaxel, carboplatin in March 2022).  He was admitted not long after starting treatment in the setting of c diff and neutropenic fever.  During that hospitalization he had atrial fibrillation and required treatment with amiodarone which worked well.  This was discontinued in June when his CT scan showed right sided lung findings worrisome for either radiation fibrosis vs drug effect.  He continued on Keytruda.    PCCM was consulted today because he was hospitalized this week due to fatigue and dyspnea.  He denied mucus production prior to admission but he has had a cough since 2020 when he had COVID. He was noted to have pseduomonas bacteremia.  He has been treated for that with antibiotics.  He was found to be in atrial fibrillation.  PCCM was consulted to discuss the risk benefit ratio of amiodarone use for his atrial fibrillation.  Since his hospitalization he has been coughing up more mucus.  He can't feel when he is in atrial fibrillation but he thinks it started when he was given a breathing treatment.   Pertinent  Medical History  Stage IV squamous cell lung cancer Paroxysmal atrial fibrillation GERD Rheumatoid arthritis   Significant Hospital  Events: Including procedures, antibiotic start and stop dates in addition to other pertinent events   8/21 admit for pseudomonas bacteremia 8/22 CT chest > dense consolidation right upper lobe and right middle lobe, right upper lobe cavitary mass, very mild, scattered non-specific patchy infiltrate in left lung periphery, right hilar nodes enlarged 8/26 PCCM consult for consideration of re-initiating amiodarone  Interim History / Subjective:  As above  Objective   Blood pressure 114/89, pulse (!) 113, temperature 98.2 F (36.8 C), resp. rate 20, height 5\' 10"  (1.778 m), weight 92.5 kg, SpO2 97 %.        Intake/Output Summary (Last 24 hours) at 10/10/2020 1051 Last data filed at 10/10/2020 1021 Gross per 24 hour  Intake 1080 ml  Output --  Net 1080 ml   Filed Weights   10/05/20 1956  Weight: 92.5 kg    Examination:  General:  Resting comfortably in bed HENT: NCAT OP clear PULM: Focal wheeze right lung, clear on left, normal effort CV: Irreg irreg, no mgr GI: BS+, soft, nontender MSK: normal bulk and tone Neuro: awake, alert, no distress, MAEW   Resolved Hospital Problem list     Assessment & Plan:  Atrial fibrillation with rvr Tele Per cardiology OK from our standpoint to restart amiodarone Hold albuterol  Right upper lobe consolidation: clearly worse compared to June 2022.  Could be infectious or inflammatory or possibly recurrence of his squamous cell carcinoma.  I advised a bronchoscopy to evaluate further, he refused citing he is "too tired and he doesn't think his  body can handle it".  I don't think that this is just infection, based on imaging I think it is most likely radiation inflammatory damage as it is unilateral and in the distribution of where he received radiation.  I doubt amiodarone or keytruda effect as we would expect more bilateral lung involvement. As above, OK to restart amiodarone Needs outpatient follow up We are able to perform a bronchoscopy if  he is interested Needs re-imaging in a 3-4 weeks  Cavitary mass right lung: likely source of the pseudomonas bacteremia Would plan for a minimum of 3 weeks of IV antibiotics then re-image  COPD: mild obstruction; had atrial fibrillation after albuterol Would hold off on bronchodilators for now unless dyspnea/wheezing increases If needed bronchodilators would use Xopenex  Will notify Dr. [name] the patient is here We will sign off but can see him again as needed  Best Practice (right click and "Reselect all SmartList Selections" daily)   Per primary/oncology  Labs   CBC: Recent Labs  Lab 10/05/20 2040 10/06/20 0430 10/07/20 0443 10/08/20 0508 10/09/20 0437 10/10/20 0509  WBC 1.8* 1.4* 1.3* 1.4* 1.6* 1.8*  NEUTROABS 0.6* 0.4*  --   --  0.7* 0.6*  HGB 11.8* 9.3* 9.0* 9.1* 9.9* 11.1*  HCT 34.7* 28.3* 27.2* 28.5* 30.3* 34.0*  MCV 87.2 89.6 89.2 91.3 90.7 90.4  PLT 175 132* 160 181 197 709    Basic Metabolic Panel: Recent Labs  Lab 10/05/20 2040 10/06/20 0139 10/06/20 0430 10/07/20 0443 10/08/20 0508 10/09/20 0437  NA 129*  --  133* 138 138 140  K 3.9  --  3.8 3.6 3.2* 3.5  CL 95*  --  102 109 113* 112*  CO2 25  --  25 22 22 23   GLUCOSE 115*  --  118* 153* 130* 138*  BUN 14  --  14 17 12 11   CREATININE 1.27*  --  1.01 1.12 0.80 0.93  CALCIUM 8.3*  --  7.9* 8.0* 7.8* 8.1*  MG  --  1.5* 1.5* 2.1  --   --   PHOS  --  3.9  --   --   --   --    GFR: Estimated Creatinine Clearance: 97.7 mL/min (by C-G formula based on SCr of 0.93 mg/dL). Recent Labs  Lab 10/05/20 2040 10/06/20 0046 10/06/20 0139 10/06/20 0430 10/07/20 0443 10/08/20 0508 10/09/20 0437 10/10/20 0509  PROCALCITON  --   --  7.93  --  5.34  --   --   --   WBC 1.8*  --   --    < > 1.3* 1.4* 1.6* 1.8*  LATICACIDVEN 1.7 1.0  --   --   --   --   --   --    < > = values in this interval not displayed.    Liver Function Tests: Recent Labs  Lab 10/05/20 2040 10/06/20 0430 10/07/20 0443  10/08/20 0508  AST 36 27 19 18   ALT 26 23 21 22   ALKPHOS 74 53 46 46  BILITOT 1.3* 1.3* 0.8 0.4  PROT 6.8 5.7* 5.5* 5.3*  ALBUMIN 2.9* 2.4* 2.3* 2.2*   No results for input(s): LIPASE, AMYLASE in the last 168 hours. No results for input(s): AMMONIA in the last 168 hours.  ABG No results found for: PHART, PCO2ART, PO2ART, HCO3, TCO2, ACIDBASEDEF, O2SAT   Coagulation Profile: Recent Labs  Lab 10/05/20 2040  INR 1.1    Cardiac Enzymes: No results for input(s): CKTOTAL, CKMB, CKMBINDEX, TROPONINI in the  last 168 hours.  HbA1C: No results found for: HGBA1C  CBG: Recent Labs  Lab 10/07/20 1117 10/09/20 2032  GLUCAP 134* 146*    Review of Systems:   Gen: per HPI HEENT: Denies blurred vision, double vision, hearing loss, tinnitus, sinus congestion, rhinorrhea, sore throat, neck stiffness, dysphagia PULM: per HPI CV: Denies chest pain, edema, orthopnea, paroxysmal nocturnal dyspnea, palpitations GI: Denies abdominal pain, nausea, vomiting, diarrhea, hematochezia, melena, constipation, change in bowel habits GU: Denies dysuria, hematuria, polyuria, oliguria, urethral discharge Endocrine: Denies hot or cold intolerance, polyuria, polyphagia or appetite change Derm: Denies rash, dry skin, scaling or peeling skin change Heme: Denies easy bruising, bleeding, bleeding gums Neuro: Denies headache, numbness, weakness, slurred speech, loss of memory or consciousness   Past Medical History:  He,  has a past medical history of Dyspnea, Family history of adverse reaction to anesthesia, GERD (gastroesophageal reflux disease), History of radiation therapy (04/24/2020-05/16/2020), PAF (paroxysmal atrial fibrillation) ([place]), Rheumatoid aortitis, and Squamous cell lung cancer ([place]).   Surgical History:   Past Surgical History:  Procedure Laterality Date   BRONCHIAL BRUSHINGS  03/27/2020   Procedure: BRONCHIAL BRUSHINGS;  Surgeon: [name], MD;  Location: [place];  Service:  Cardiopulmonary;;   CATARACT EXTRACTION  2016   at [place]  03/27/2020   Procedure: FINE NEEDLE ASPIRATION;  Surgeon: [name], MD;  Location: [name];  Service: Cardiopulmonary;;   HIP SURGERY Left    VIDEO BRONCHOSCOPY WITH ENDOBRONCHIAL ULTRASOUND N/A 03/27/2020   Procedure: VIDEO BRONCHOSCOPY WITH ENDOBRONCHIAL ULTRASOUND;  Surgeon: [name], MD;  Location: [place];  Service: Cardiopulmonary;  Laterality: N/A;     Social History:   reports that he quit smoking about 4 years ago. His smoking use included cigarettes. He has a 30.00 pack-year smoking history. He has never used smokeless tobacco. He reports that he does not currently use alcohol. He reports that he does not use drugs.   Family History:  His family history includes Arrhythmia in his mother; Heart disease in his father.   Allergies Allergies  Allergen Reactions   Albuterol     Patient has had episode of atrial fib following administration of albuterol (tolerates Xopenex well)     Home Medications  Prior to Admission medications   Medication Sig Start Date End Date Taking? Authorizing Provider  acetaminophen (TYLENOL) 500 MG tablet Take 500 mg by mouth every 6 (six) hours as needed for mild pain or moderate pain.   Yes [provider]  levalbuterol (XOPENEX HFA) 45 MCG/ACT inhaler Inhale 2 puffs into the lungs every 4 (four) hours as needed for wheezing. 06/11/20  Yes [name], MD  metoprolol tartrate (LOPRESSOR) 25 MG tablet Take 1 tablet (25 mg total) by mouth 2 (two) times daily. 06/27/20  Yes Turner, Eber Hong, MD  PARoxetine (PAXIL) 10 MG tablet Take 10 mg by mouth daily. 01/25/20  Yes [provider]  predniSONE (DELTASONE) 5 MG tablet Take 5-10 mg by mouth daily. 09/01/20  Yes [provider]  PRILOSEC OTC 20 MG tablet Take 20 mg by mouth at bedtime. 11/13/19  Yes [provider]     Critical care time: n/a    Roselie Awkward,  MD [place] P[facility]M Pager: [phone] Cell: [phone] After 7:00 pm call [place]ink  [phone]

## 2020-10-10 NOTE — Plan of Care (Signed)
  Problem: Health Behavior/Discharge Planning: Goal: Ability to manage health-related needs will improve Outcome: Progressing   Problem: Clinical Measurements: Goal: Ability to maintain clinical measurements within normal limits will improve Outcome: Progressing   Problem: Clinical Measurements: Goal: Diagnostic test results will improve Outcome: Progressing

## 2020-10-10 NOTE — Progress Notes (Addendum)
Progress Note  Patient Name: John [name] Date of Encounter: 10/10/2020  Primary Cardiologist: Fransico Him, MD  Subjective   Reports increased SOB overnight, O2 placed back. Tmax 99.8 overnight. He states that the nebulizer causes him to cough and aggravates his cancer. I discussed the CT findings that would suggest he should be on anticoagulation, which [name] had also suggested due to thromboembolic risk from cancer. He states he is very frustrated having received conflicting information about blood thinner recommendations. He states internal medicine told him he would not require a blood thinner. I tried to reassure him we are here to provide recommendations in real-time as more clinical information becomes available.  He has no specific symptoms related to his atrial fib at this time. He is requesting to go back on amiodarone.  Inpatient Medications    Scheduled Meds:  ciprofloxacin  750 mg Oral BID   metoprolol tartrate  12.5 mg Oral Once   metoprolol tartrate  25 mg Oral BID   pantoprazole  40 mg Oral QHS   PARoxetine  10 mg Oral Daily   predniSONE  10 mg Oral Q breakfast   rivaroxaban  20 mg Oral Q supper   vancomycin  125 mg Oral BID   Continuous Infusions:  PRN Meds: acetaminophen **OR** acetaminophen, magic mouthwash, menthol-cetylpyridinium   Vital Signs    Vitals:   10/09/20 2013 10/09/20 2031 10/10/20 0505 10/10/20 0715  BP:  108/88 104/68 118/85  Pulse:  (!) 109 (!) 140 (!) 120  Resp:  20 20   Temp:  99.1 F (37.3 C) 99.8 F (37.7 C) 99 F (37.2 C)  TempSrc:  Oral Oral Oral  SpO2: 97% 97% 93% 96%  Weight:      Height:        Intake/Output Summary (Last 24 hours) at 10/10/2020 0800 Last data filed at 10/09/2020 2300 Gross per 24 hour  Intake 1100 ml  Output --  Net 1100 ml   Last 3 Weights 10/05/2020 09/30/2020 09/22/2020  Weight (lbs) 204 lb 208 lb 204 lb  Weight (kg) 92.534 kg 94.348 kg 92.534 kg     Telemetry    Atrial fib rates  100-140s, also intermittent wide complex beating - ? NSVT vs abberrancy - follows candence of present rate - Personally Reviewed  Physical Exam   GEN: No acute distress.  HEENT: Normocephalic, atraumatic, sclera non-icteric. Neck: No JVD or bruits. Cardiac: Irregularly irregular, tachycardic, no murmurs, rubs, or gallops.  Respiratory: Diminished throughout but otherwise clear without wheezing, rales or rhonchi. Breathing is unlabored on O2. GI: Soft, nontender, non-distended, BS +x 4. MS: no deformity. Extremities: No clubbing or cyanosis. Trace ankle edema. Distal pedal pulses are 2+ and equal bilaterally. Neuro:  AAOx3. Follows commands. Psych:  Responds to questions appropriately with a normal affect.  Labs    High Sensitivity Troponin:  No results for input(s): TROPONINIHS in the last 720 hours.    Cardiac EnzymesNo results for input(s): TROPONINI in the last 168 hours. No results for input(s): TROPIPOC in the last 168 hours.   Chemistry Recent Labs  Lab 10/06/20 0430 10/07/20 0443 10/08/20 0508 10/09/20 0437  NA 133* 138 138 140  K 3.8 3.6 3.2* 3.5  CL 102 109 113* 112*  CO2 25 22 22 23   GLUCOSE 118* 153* 130* 138*  BUN 14 17 12 11   CREATININE 1.01 1.12 0.80 0.93  CALCIUM 7.9* 8.0* 7.8* 8.1*  PROT 5.7* 5.5* 5.3*  --   ALBUMIN 2.4* 2.3* 2.2*  --  AST 27 19 18   --   ALT 23 21 22   --   ALKPHOS 53 46 46  --   BILITOT 1.3* 0.8 0.4  --   GFRNONAA >60 >60 >60 >60  ANIONGAP 6 7 3* 5     Hematology Recent Labs  Lab 10/08/20 0508 10/09/20 0437 10/10/20 0509  WBC 1.4* 1.6* 1.8*  RBC 3.12* 3.34* 3.76*  HGB 9.1* 9.9* 11.1*  HCT 28.5* 30.3* 34.0*  MCV 91.3 90.7 90.4  MCH 29.2 29.6 29.5  MCHC 31.9 32.7 32.6  RDW 16.6* 16.3* 16.1*  PLT 181 197 218    BNPNo results for input(s): BNP, PROBNP in the last 168 hours.   DDimer No results for input(s): DDIMER in the last 168 hours.   Radiology    No results found.  Cardiac Studies   2d echo 05/12/20  1. Left  ventricular ejection fraction, by estimation, is 60 to 65%. The  left ventricle has normal function. The left ventricle has no regional  wall motion abnormalities. Left ventricular diastolic parameters are  indeterminate.   2. Right ventricular systolic function is normal. The right ventricular  size is normal. Tricuspid regurgitation signal is inadequate for assessing  PA pressure.   3. The mitral valve is normal in structure. Trivial mitral valve  regurgitation. No evidence of mitral stenosis.   4. The aortic valve is tricuspid. Aortic valve regurgitation is not  visualized. No aortic stenosis is present.   5. The inferior vena cava is normal in size with greater than 50%  respiratory variability, suggesting right atrial pressure of 3 mmHg.   6. The patient is in atrial fibrillation.   Patient Profile     60 y.o. 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 Dx with PAF in the setting of acute illness 04/2020, on amiodarone until stopped 07/2020 (CT scan changes at that time). This admission was admitted with fever and vomiting. He was found to have pseudomonal bacteremia, neuropenic fever, hypomagnesemia, hypokalemia, hyponatremia, and hypotension. HIV antibody was reactive but subsequent confirmatory testing unrevealing. He went into atrial fib RVR shortly after breathing treatment (duoneb).   Assessment & Plan    1. Neutropenic fever with pseudomonal bacteremia/sepsis - multiple issues on admission including abnormal CBC, electrolyte abnormalities, hypoalbuminemia, clinically improved - CT findings as noted - cough, SOB, low grade temp overnight - management per primary team   2. Paroxysmal atrial fib RVR  - patient feels incited by albuterol rx, feeling better with Xopenex - also has known pre-disposition to atrial fib as  noted before and also in the context of numerous physiologic stressors so challenging to know for certain, but added albuterol to list of meds to avoid in the future - CT also shows evidence of splenic infarcts which potentially raises CHADSVASC to 3 (atherosclerosis on CT, infarct event) - [name] has recommended initiation of anticoagulation due to thromboembolic risk; Dr. Radford Pax reviewed with EP and agrees - amiodarone previously d/c'd 07/2020 in setting of NSR / CT changes (?pulmonary toxicity) although on CT this admission those changes are favored to be post-radiation - Dr. Radford Pax recommends we formally involve pulmonology for their input. Patient originally requested to never resume this again. This morning he is requesting to go back on it as he felt it worked very well previously. Further complicating this issue is the updated CHADSVASC score, so may need to consider TEE before  cardioverting  - patient was concerned about taking metoprolol - most recent pressure 118/85 - we discussed that he is on this medication at home  and he is agreeable to trying the higher dose - consideration could be given to trial of diltiazem instead - will review medication plans along with telemetry findings (wider complexes) with MD - baseline QTC on sinus EKGs is >488ms so does not seem dofetilide is an option - consider OP sleep study when clinically improved - reports ++snoring prior to losing weight with chemo but he's not sure about recently   3. Aortic atherosclerosis, coronary atherosclerosis - incidental pickup on CT (not unusual finding, but taking note given CHADSVASC score) - no ischemic symptoms - consider statin rx as outpatient - given patient's sensitivity to medications would prefer one focus at a time   For questions or updates, please contact [facility] Please consult [email or web address] for contact info under Cardiology/STEMI.  Signed, Charlie Pitter, PA-C 10/10/2020, 8:00 AM

## 2020-10-10 NOTE — Progress Notes (Signed)
Patient refused Steroid and Vancomycin. RN explained the importance of both the medication. Verbalized understanding, but still refused as patient stated the MD already put jim on a different antibiotic.

## 2020-10-10 NOTE — Progress Notes (Signed)
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 fib not on anticoagulation due to low score, GERD, history of CAD, on Toprol admitted with With neutropenic fever, sepsis with SIRS criteria   In the ED febrile 100.1, hyponatremic neutropenic.  Placed on vancomycin/cefepime. Chest x-ray nonacute de-escalation could be radiation pneumonitis versus superimposed infection, normal lactic acid, UA no evidence of UTI, COVID-19 negative. CT chest: Increasingly conspicuous lymph nodes in the right hilar, mediastinal, lower right cervical and supraclavicular nodal stations concerning for potential metastatic disease, although some of this could be simply reactive . Satellite nodule measuring 11 mm in the right upper lobe, stable compared to the prior study.Right adrenal metastasis. 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 Granix ordered but patient declined given previous intolerance. WBC count recently improving.  Patient found to have Pseudomonas sepsis-based on the sensitivity patient can go on Cipro through 8/28 and oral vancomycin until 9/4 for secondary C Diff prophylaxis.  Subjective: Overnight reports she had a rough night he had bouts of cough and the pain in the right chest which calmed down after oxygen.  This morning resting comfortably no shortness of breath.  Overnight A. fib uncontrolled upto 150s. Refused oral vancomycin IV steroid last night changed to p.o. this morning Feels better after being on  oxygen overnight.  Assessment & Plan:  Neutropenic fever Pseudomonas sepsis  POA Immunosuppressed on immunotherapy and on long-term steroid for RA No clear source for bacteremia.Appreciate ID input - off vancomycin iv, s/p cefepime and  ID advised based on the sensitivity to continue Cipro through 8/28 and oral vancomycin until 9/4 for secondary C Diff prophylaxis 2/2 hx of C DIFF in march- he refused this am- counseled this am and he is agreeable to take it.WBC count much better 1800  this am.  Neutropenia with anemia, thrombocytopenia and neutropenia: Not due to immunotherapy per oncology.  He has predated pancytopenia prior to his immunotherapy likely chronic component.  Drop suspected due to his sepsis also has splenomegaly.  WBC count slowly improving  Recent Labs  Lab 10/06/20 0430 10/07/20 0443 10/08/20 0508 10/09/20 0437 10/10/20 0509  WBC 1.4* 1.3* 1.4* 1.6* 1.8*   PAF with RVR-remains poorly controlled.  Metoprolol  increased to 25 mg 8/25-cardiology following closely and on Xarelto. CHADS2VASC2 score is 3 as per Cardio- Plan per cardiology.  Pulmonary consulted by cardiology given risk of lung injury with amiodarone and per pulmonary okay to restart amiodarone.  COPD with asthma with chronic wheezing Radiation pneumonitis  he had bouts of cough overnight short of breath, placed on oxygen, chest x-ray done-stable posttreatment appearance of right lung with presumed mostly radiation pneumonitis.   Continue steroids  po, he does not want to be on IV hydrocortisone.Does not tolerate albuterol due to A. Fib now listed as allergy. Pulm consulted-holding off on bronchodilators unless dyspneic or wheezing increases and can use Xopenex instead. Per pulmonary okay to restart amiodarone.  Cavitary right lung mass: Pulmonary consulted could be source of bacteremia.   Hypomagnesemia/Hypokalemia: resolved Hypotension-resolved blood pressure stable  100-118 Hyponatremia:suspect volume  depletion. Resolved w ivf.   RA on long-term prednisone-s/p stress dose, cont po prednisone 10 mg form today  SCC stage IV lung SCC with adrenal metastasis on immunotherapy-Dr Ennever ollowing.  CT chest shows lymph nodes in the right hilar mediastinal nor right cervical and supraclavicular node-?Potentially metastatic disease versus reactive lymph nodes.   HIV Ab 1/2 AB: Came back negative. On admission screening test positive.Likely false positive. I had discussed with Dr Linus Salmons from [place].   Diet Order             Diet Heart Room service appropriate? Yes; Fluid consistency: Thin  Diet effective now                 Patient's Body mass index is 29.27 kg/m. DVT prophylaxis: XARELTO Code Status:   Code Status: Full Code  Family Communication: plan of care discussed with patient at bedside. Status is: Inpatient Remains inpatient appropriate because:Inpatient level of care appropriate due to severity of illness Dispo: The patient is from: Home              Anticipated d/c is to: Home              Patient currently is not medically stable to d/c.   Difficult to place patient No Unresulted Labs (From admission, onward)     Start     Ordered   10/09/20 0500  CBC with Differential/Platelet  Daily,   R      10/08/20 0945   10/06/20 0114  Expectorated Sputum Assessment w Gram Stain, Rflx to Resp Cult  Once,   R        10/06/20 0113            Medications reviewed: Scheduled Meds:  ciprofloxacin  750 mg Oral BID   metoprolol tartrate  12.5 mg Oral Once   metoprolol tartrate  25 mg Oral BID   pantoprazole  40 mg Oral QHS   PARoxetine  10 mg Oral Daily   predniSONE  10 mg Oral Q breakfast   rivaroxaban  20 mg Oral Q supper   vancomycin  125 mg Oral BID   Continuous Infusions: Consultants:see note  Procedures:see note Antimicrobials: Anti-infectives (From admission, onward)    Start     Dose/Rate Route Frequency Ordered Stop   10/09/20 2200  vancomycin (VANCOCIN) capsule 125  mg        125 mg Oral 2 times daily 10/09/20 1334     10/09/20 1400  ciprofloxacin (CIPRO) tablet 750 mg        750 mg Oral 2 times daily 10/09/20 0903 10/15/20 0759   10/07/20 2200  vancomycin (VANCOCIN) capsule 125 mg  Status:  Discontinued        125 mg Oral 2 times daily 10/07/20 1718 10/09/20 0719   10/06/20 2000  vancomycin (VANCOREADY) IVPB 1500 mg/300 mL  Status:  Discontinued        1,500 mg 150 mL/hr over 120 Minutes Intravenous Every 24 hours 10/05/20 2149 10/06/20 1046   10/06/20 1200  vancomycin (VANCOREADY) IVPB 1250 mg/250 mL  Status:  Discontinued        1,250 mg 166.7 mL/hr over 90 Minutes Intravenous Every 12 hours 10/06/20 1046 10/07/20 1427   10/06/20 0600  ceFEPIme (MAXIPIME) 2 g in sodium chloride 0.9 % 100 mL IVPB  Status:  Discontinued        2 g 200 mL/hr over 30 Minutes Intravenous Every 8 hours 10/05/20 2149 10/09/20  5170   10/05/20 2030  ceFEPIme (MAXIPIME) 2 g in sodium chloride 0.9 % 100 mL IVPB        2 g 200 mL/hr over 30 Minutes Intravenous  Once 10/05/20 2017 10/05/20 2216   10/05/20 2030  vancomycin (VANCOCIN) IVPB 1000 mg/200 mL premix  Status:  Discontinued        1,000 mg 200 mL/hr over 60 Minutes Intravenous  Once 10/05/20 2017 10/05/20 2029   10/05/20 2030  vancomycin (VANCOCIN) IVPB 1000 mg/200 mL premix        1,000 mg 200 mL/hr over 60 Minutes Intravenous Every hour 10/05/20 2029 10/05/20 2319      Culture/Microbiology    Component Value Date/Time   SDES  10/05/2020 2215    IN/OUT CATH URINE Performed at [facility], [address] Madelaine Bhat [place], [place] [address]    [facility]  10/05/2020 2215    NONE Performed at [facility], [address] [place], [place] [address]    CULT  10/05/2020 2215    NO GROWTH Performed at [facility], [place] [address] [place], [place] [address]    REPTSTATUS 10/07/2020 FINAL 10/05/2020 2215    Other culture-see note  Objective: Vitals: Today's Vitals   10/09/20 2031  10/10/20 0505 10/10/20 0715 10/10/20 0904  BP: 108/88 104/68 118/85 114/89  Pulse: (!) 109 (!) 140 (!) 120 (!) 113  Resp: 20 20  20   Temp: 99.1 F (37.3 C) 99.8 F (37.7 C) 99 F (37.2 C) 98.2 F (36.8 C)  TempSrc: Oral Oral Oral   SpO2: 97% 93% 96% 97%  Weight:      Height:      PainSc: 0-No pain       Intake/Output Summary (Last 24 hours) at 10/10/2020 0945 Last data filed at 10/09/2020 2300 Gross per 24 hour  Intake 600 ml  Output --  Net 600 ml   Filed Weights   10/05/20 1956  Weight: 92.5 kg   Weight change:   Intake/Output from previous day: 08/25 0701 - 08/26 0700 In: 1100 [P.O.:1100] Out: -  Intake/Output this shift: No intake/output data recorded. Filed Weights   10/05/20 1956  Weight: 92.5 kg   Examination: General exam: AAOx 3,pleasant, older than stated age, weak appearing. HEENT:Oral mucosa moist, Ear/Nose WNL grossly, dentition normal. Respiratory system: bilaterally diminished with expiratory wheezing, no use of accessory muscle Cardiovascular system: S1 & S2 +, irregularly irregular heart rate in 120s to 130s, no JVD,. Gastrointestinal system: Abdomen soft, NT,ND, BS+ Nervous System:Alert, awake, moving extremities and grossly nonfocal Extremities: no edema, distal peripheral pulses palpable.  Skin: No rashes,no icterus. MSK: Normal muscle bulk,tone, power   Data Reviewed: I have personally reviewed following labs and imaging studies CBC: Recent Labs  Lab 10/05/20 2040 10/06/20 0430 10/07/20 0443 10/08/20 0508 10/09/20 0437 10/10/20 0509  WBC 1.8* 1.4* 1.3* 1.4* 1.6* 1.8*  NEUTROABS 0.6* 0.4*  --   --  0.7* 0.6*  HGB 11.8* 9.3* 9.0* 9.1* 9.9* 11.1*  HCT 34.7* 28.3* 27.2* 28.5* 30.3* 34.0*  MCV 87.2 89.6 89.2 91.3 90.7 90.4  PLT 175 132* 160 181 197 846   Basic Metabolic Panel: Recent Labs  Lab 10/05/20 2040 10/06/20 0139 10/06/20 0430 10/07/20 0443 10/08/20 0508 10/09/20 0437  NA 129*  --  133* 138 138 140  K 3.9  --  3.8 3.6  3.2* 3.5  CL 95*  --  102 109 113* 112*  CO2 25  --  25 22 22 23   GLUCOSE 115*  --  118* 153* 130* 138*  BUN 14  --  14 17 12 11   CREATININE 1.27*  --  1.01 1.12 0.80 0.93  CALCIUM 8.3*  --  7.9* 8.0* 7.8* 8.1*  MG  --  1.5* 1.5* 2.1  --   --   PHOS  --  3.9  --   --   --   --    GFR: Estimated Creatinine Clearance: 97.7 mL/min (by C-G formula based on SCr of 0.93 mg/dL). Liver Function Tests: Recent Labs  Lab 10/05/20 2040 10/06/20 0430 10/07/20 0443 10/08/20 0508  AST 36 27 19 18   ALT 26 23 21 22   ALKPHOS 74 53 46 46  BILITOT 1.3* 1.3* 0.8 0.4  PROT 6.8 5.7* 5.5* 5.3*  ALBUMIN 2.9* 2.4* 2.3* 2.2*   No results for input(s): LIPASE, AMYLASE in the last 168 hours. No results for input(s): AMMONIA in the last 168 hours. Coagulation Profile: Recent Labs  Lab 10/05/20 2040  INR 1.1   Cardiac Enzymes: No results for input(s): CKTOTAL, CKMB, CKMBINDEX, TROPONINI in the last 168 hours. BNP (last 3 results) No results for input(s): PROBNP in the last 8760 hours. HbA1C: No results for input(s): HGBA1C in the last 72 hours. CBG: Recent Labs  Lab 10/07/20 1117 10/09/20 2032  GLUCAP 134* 146*   Lipid Profile: Recent Labs    10/10/20 0509  CHOL 133  HDL 22*  LDLCALC 94  TRIG 87  CHOLHDL 6.0   Thyroid Function Tests: No results for input(s): TSH, T4TOTAL, FREET4, T3FREE, THYROIDAB in the last 72 hours. Anemia Panel: No results for input(s): VITAMINB12, FOLATE, FERRITIN, TIBC, IRON, RETICCTPCT in the last 72 hours. Sepsis Labs: Recent Labs  Lab 10/05/20 2040 10/06/20 0046 10/06/20 0139 10/07/20 0443  PROCALCITON  --   --  7.93 5.34  LATICACIDVEN 1.7 1.0  --   --     Recent Results (from the past 240 hour(s))  Blood Culture (routine x 2)     Status: Abnormal   Collection Time: 10/05/20  8:17 PM   Specimen: Right Antecubital; Blood  Result Value Ref Range Status   Specimen Description   Final    RIGHT ANTECUBITAL Performed at [facility], [address] [place], [place] [address]    Special Requests   Final    BOTTLES DRAWN AEROBIC AND ANAEROBIC Blood Culture adequate volume Performed at [facility], [address] [place], [place] [address]    Culture  Setup Time   Final    GRAM NEGATIVE RODS AEROBIC BOTTLE ONLY CRITICAL RESULT CALLED TO, READ BACK BY AND VERIFIED WITH: E JACKSON,[id number]  10/06/21 [place] Performed at [facility], [place] [address] [name], [place] [address]    Culture PSEUDOMONAS AERUGINOSA (A)  Final   Report Status 10/09/2020 FINAL  Final   Organism ID, Bacteria PSEUDOMONAS AERUGINOSA  Final      Susceptibility   Pseudomonas aeruginosa - MIC*    CEFTAZIDIME 4 SENSITIVE Sensitive     CIPROFLOXACIN <=0.25 SENSITIVE Sensitive     GENTAMICIN <=1 SENSITIVE Sensitive     IMIPENEM 2 SENSITIVE Sensitive     PIP/TAZO 8 SENSITIVE Sensitive     CEFEPIME 2 SENSITIVE Sensitive     * PSEUDOMONAS AERUGINOSA  Blood Culture ID Panel (Reflexed)     Status: Abnormal   Collection Time: 10/05/20  8:17 PM  Result Value Ref Range Status   Enterococcus faecalis  NOT DETECTED NOT DETECTED Final   Enterococcus Faecium NOT DETECTED NOT DETECTED Final   Listeria monocytogenes NOT DETECTED NOT DETECTED Final   Staphylococcus species NOT DETECTED NOT DETECTED Final   Staphylococcus aureus (BCID) NOT DETECTED NOT DETECTED Final   Staphylococcus epidermidis NOT DETECTED NOT DETECTED Final   Staphylococcus lugdunensis NOT DETECTED NOT DETECTED Final   Streptococcus species NOT DETECTED NOT DETECTED Final   Streptococcus agalactiae NOT DETECTED NOT DETECTED Final   Streptococcus pneumoniae NOT DETECTED NOT DETECTED Final   Streptococcus pyogenes NOT DETECTED NOT DETECTED Final   A.calcoaceticus-baumannii NOT DETECTED NOT DETECTED Final   Bacteroides fragilis NOT DETECTED NOT DETECTED Final   Enterobacterales NOT DETECTED NOT DETECTED Final   Enterobacter cloacae complex NOT DETECTED NOT DETECTED Final    Escherichia coli NOT DETECTED NOT DETECTED Final   Klebsiella aerogenes NOT DETECTED NOT DETECTED Final   Klebsiella oxytoca NOT DETECTED NOT DETECTED Final   Klebsiella pneumoniae NOT DETECTED NOT DETECTED Final   Proteus species NOT DETECTED NOT DETECTED Final   Salmonella species NOT DETECTED NOT DETECTED Final   Serratia marcescens NOT DETECTED NOT DETECTED Final   Haemophilus influenzae NOT DETECTED NOT DETECTED Final   Neisseria meningitidis NOT DETECTED NOT DETECTED Final   Pseudomonas aeruginosa DETECTED (A) NOT DETECTED Final    Comment: CRITICAL RESULT CALLED TO, READ BACK BY AND VERIFIED WITH: E JACKSON,RN@[id number]  10/07/20 [place]    Stenotrophomonas maltophilia NOT DETECTED NOT DETECTED Final   Candida albicans NOT DETECTED NOT DETECTED Final   Candida auris NOT DETECTED NOT DETECTED Final   Candida glabrata NOT DETECTED NOT DETECTED Final   Candida krusei NOT DETECTED NOT DETECTED Final   Candida parapsilosis NOT DETECTED NOT DETECTED Final   Candida tropicalis NOT DETECTED NOT DETECTED Final   Cryptococcus neoformans/gattii NOT DETECTED NOT DETECTED Final   CTX-M ESBL NOT DETECTED NOT DETECTED Final   Carbapenem resistance IMP NOT DETECTED NOT DETECTED Final   Carbapenem resistance KPC NOT DETECTED NOT DETECTED Final   Carbapenem resistance NDM NOT DETECTED NOT DETECTED Final   Carbapenem resistance VIM NOT DETECTED NOT DETECTED Final    Comment: Performed at [facility], [address], [place], [place] [address]  Blood Culture (routine x 2)     Status: None (Preliminary result)   Collection Time: 10/05/20  8:22 PM   Specimen: BLOOD RIGHT HAND  Result Value Ref Range Status   Specimen Description   Final    BLOOD RIGHT HAND Performed at [facility], [address] [place], [place] [address]    Special Requests   Final    BOTTLES DRAWN AEROBIC AND ANAEROBIC Blood Culture results may not be optimal due to an inadequate volume of blood received in culture  bottles Performed at [facility], [address] [place], [place] [address]    Culture   Final    NO GROWTH 4 DAYS Performed at [facility], [place] [address], [place], [place] [address]    Report Status PENDING  Incomplete  Resp Panel by RT-PCR (Flu A&B, Covid) Nasopharyngeal Swab     Status: None   Collection Time: 10/05/20  8:40 PM   Specimen: Nasopharyngeal Swab; Nasopharyngeal(NP) swabs in vial transport medium  Result Value Ref Range Status   SARS Coronavirus 2 by RT PCR NEGATIVE NEGATIVE Final    Comment: (NOTE) SARS-CoV-2 target nucleic acids are NOT DETECTED.  The SARS-CoV-2 RNA is generally detectable in upper respiratory specimens during  the acute phase of infection. The lowest concentration of SARS-CoV-2 viral copies this assay can detect is 138 copies/mL. A negative result does not preclude SARS-Cov-2 infection and should not be used as the sole basis for treatment or other patient management decisions. A negative result may occur with  improper specimen collection/handling, submission of specimen other than nasopharyngeal swab, presence of viral mutation(s) within the areas targeted by this assay, and inadequate number of viral copies(<138 copies/mL). A negative result must be combined with clinical observations, patient history, and epidemiological information. The expected result is Negative.  Fact Sheet for Patients:  EntrepreneurPulse.com.au  Fact Sheet for Healthcare Providers:  IncredibleEmployment.be  This test is no t yet approved or cleared by the [place] FDA and  has been authorized for detection and/or diagnosis of SARS-CoV-2 by FDA under an Emergency Use Authorization (EUA). This EUA will remain  in effect (meaning this test can be used) for the duration of the COVID-19 declaration under Section 564(b)(1) of the Act, 21 U.S.C.section 360bbb-3(b)(1), unless the authorization is terminated  or  revoked sooner.       Influenza A by PCR NEGATIVE NEGATIVE Final   Influenza B by PCR NEGATIVE NEGATIVE Final    Comment: (NOTE) The Xpert Xpress SARS-CoV-2/FLU/RSV plus assay is intended as an aid in the diagnosis of influenza from Nasopharyngeal swab specimens and should not be used as a sole basis for treatment. Nasal washings and aspirates are unacceptable for Xpert Xpress SARS-CoV-2/FLU/RSV testing.  Fact Sheet for Patients: EntrepreneurPulse.com.au  Fact Sheet for Healthcare Providers: IncredibleEmployment.be  This test is not yet approved or cleared by the [place] FDA and has been authorized for detection and/or diagnosis of SARS-CoV-2 by FDA under an Emergency Use Authorization (EUA). This EUA will remain in effect (meaning this test can be used) for the duration of the COVID-19 declaration under Section 564(b)(1) of the Act, 21 U.S.C. section 360bbb-3(b)(1), unless the authorization is terminated or revoked.  Performed at [facility], [address] [place], [place] [address]   Urine Culture     Status: None   Collection Time: 10/05/20 10:15 PM   Specimen: In/Out Cath Urine  Result Value Ref Range Status   Specimen Description   Final    IN/OUT CATH URINE Performed at [facility], [address] [place], [place] [address]    Special Requests   Final    NONE Performed at [facility], [address] [place], [place] [address]    Culture   Final    NO GROWTH Performed at [facility], [place] [address], [place], [place] [address]    Report Status 10/07/2020 FINAL  Final    Radiology Studies: No results found.   LOS: 4 days   Antonieta Pert, MD [facility]  10/10/2020, 9:45 AM

## 2020-10-10 NOTE — Progress Notes (Signed)
Heart rate elevated 130's to 150's afib, Oxygen saturation 93% with expiratory wheezes, Temperature 99.8. Notified Dr. Kennon Holter regarding patient heart rate sustaining 130's to 150's. Metoprolol was ordered by MD but patient refused to take medication due to history of hypotension after taking metoprolol.

## 2020-10-11 DIAGNOSIS — I952 Hypotension due to drugs: Secondary | ICD-10-CM

## 2020-10-11 DIAGNOSIS — I4891 Unspecified atrial fibrillation: Secondary | ICD-10-CM | POA: Diagnosis not present

## 2020-10-11 DIAGNOSIS — I480 Paroxysmal atrial fibrillation: Secondary | ICD-10-CM | POA: Diagnosis not present

## 2020-10-11 LAB — CULTURE, BLOOD (ROUTINE X 2): Culture: NO GROWTH

## 2020-10-11 LAB — CBC WITH DIFFERENTIAL/PLATELET
Abs Immature Granulocytes: 0.01 10*3/uL (ref 0.00–0.07)
Basophils Absolute: 0 10*3/uL (ref 0.0–0.1)
Basophils Relative: 1 %
Eosinophils Absolute: 0 10*3/uL (ref 0.0–0.5)
Eosinophils Relative: 2 %
HCT: 29.5 % — ABNORMAL LOW (ref 39.0–52.0)
Hemoglobin: 9.6 g/dL — ABNORMAL LOW (ref 13.0–17.0)
Immature Granulocytes: 1 %
Lymphocytes Relative: 40 %
Lymphs Abs: 0.5 10*3/uL — ABNORMAL LOW (ref 0.7–4.0)
MCH: 29.4 pg (ref 26.0–34.0)
MCHC: 32.5 g/dL (ref 30.0–36.0)
MCV: 90.5 fL (ref 80.0–100.0)
Monocytes Absolute: 0.4 10*3/uL (ref 0.1–1.0)
Monocytes Relative: 30 %
Neutro Abs: 0.3 10*3/uL — CL (ref 1.7–7.7)
Neutrophils Relative %: 26 %
Platelets: 183 10*3/uL (ref 150–400)
RBC: 3.26 MIL/uL — ABNORMAL LOW (ref 4.22–5.81)
RDW: 16 % — ABNORMAL HIGH (ref 11.5–15.5)
WBC: 1.3 10*3/uL — CL (ref 4.0–10.5)
nRBC: 0 % (ref 0.0–0.2)

## 2020-10-11 LAB — BASIC METABOLIC PANEL WITH GFR
Anion gap: 7 (ref 5–15)
BUN: 13 mg/dL (ref 6–20)
CO2: 26 mmol/L (ref 22–32)
Calcium: 7.9 mg/dL — ABNORMAL LOW (ref 8.9–10.3)
Chloride: 106 mmol/L (ref 98–111)
Creatinine, Ser: 0.81 mg/dL (ref 0.61–1.24)
GFR, Estimated: >60 mL/min
Glucose, Bld: 85 mg/dL (ref 70–99)
Potassium: 2.9 mmol/L — ABNORMAL LOW (ref 3.5–5.1)
Sodium: 139 mmol/L (ref 135–145)

## 2020-10-11 LAB — POTASSIUM: Potassium: 3.5 mmol/L (ref 3.5–5.1)

## 2020-10-11 LAB — MAGNESIUM: Magnesium: 1.8 mg/dL (ref 1.7–2.4)

## 2020-10-11 MED ORDER — POTASSIUM CHLORIDE CRYS ER 20 MEQ PO TBCR
40.0000 meq | EXTENDED_RELEASE_TABLET | Freq: Once | ORAL | Status: AC
  Administered 2020-10-11: 40 meq via ORAL
  Filled 2020-10-11: qty 2

## 2020-10-11 MED ORDER — POTASSIUM CHLORIDE CRYS ER 20 MEQ PO TBCR
40.0000 meq | EXTENDED_RELEASE_TABLET | ORAL | Status: AC
  Administered 2020-10-11 (×2): 40 meq via ORAL
  Filled 2020-10-11 (×2): qty 2

## 2020-10-11 MED ORDER — AMIODARONE LOAD VIA INFUSION
150.0000 mg | Freq: Once | INTRAVENOUS | Status: AC
  Administered 2020-10-11: 150 mg via INTRAVENOUS
  Filled 2020-10-11: qty 83.34

## 2020-10-11 MED ORDER — AMIODARONE HCL IN DEXTROSE 360-4.14 MG/200ML-% IV SOLN
30.0000 mg/h | INTRAVENOUS | Status: DC
  Administered 2020-10-11 – 2020-10-13 (×7): 30 mg/h via INTRAVENOUS
  Filled 2020-10-11 (×7): qty 200

## 2020-10-11 MED ORDER — MAGNESIUM SULFATE 2 GM/50ML IV SOLN
2.0000 g | Freq: Once | INTRAVENOUS | Status: AC
  Administered 2020-10-11: 2 g via INTRAVENOUS
  Filled 2020-10-11: qty 50

## 2020-10-11 MED ORDER — AMIODARONE HCL IN DEXTROSE 360-4.14 MG/200ML-% IV SOLN
60.0000 mg/h | INTRAVENOUS | Status: AC
  Administered 2020-10-11: 60 mg/h via INTRAVENOUS
  Filled 2020-10-11: qty 200

## 2020-10-11 MED ORDER — CIPROFLOXACIN HCL 500 MG PO TABS
750.0000 mg | ORAL_TABLET | Freq: Two times a day (BID) | ORAL | Status: AC
  Administered 2020-10-11 – 2020-10-12 (×4): 750 mg via ORAL
  Filled 2020-10-11 (×4): qty 1

## 2020-10-11 NOTE — Progress Notes (Signed)
Called to bedside to assist with initiation of IV amio bolus and infusion. Secondary IV placed. Assisted Joe RN with administration of amio bolus. Supervised initiation of continuous drip. Instructions provided on how to monitor patient and to change drip from 60mg /hr to 30mg / hour after 6 hours (1800). Bedside RN advised to call rapid response nurse back with any concerns.

## 2020-10-11 NOTE — Plan of Care (Signed)
  Problem: Health Behavior/Discharge Planning: Goal: Ability to manage health-related needs will improve Outcome: Progressing   Problem: Clinical Measurements: Goal: Ability to maintain clinical measurements within normal limits will improve Outcome: Progressing Goal: Will remain free from infection Outcome: Progressing Goal: Diagnostic test results will improve Outcome: Progressing Goal: Respiratory complications will improve Outcome: Progressing Goal: Cardiovascular complication will be avoided Outcome: Progressing   Problem: Activity: Goal: Risk for activity intolerance will decrease Outcome: Progressing   Problem: Nutrition: Goal: Adequate nutrition will be maintained Outcome: Progressing   Problem: Safety: Goal: Ability to remain free from injury will improve Outcome: Progressing   

## 2020-10-11 NOTE — Progress Notes (Addendum)
  Amiodarone Drug - Drug Interaction Consult Note  Recommendations: -Xarelto and amiodarone: Increased risk of bleeding - recommend continue current therapy and monitor for signs/symptoms of bleeding -Ciprofloxacin/paroxetine and amiodarone: increased risk of QTc prolongation and torsades de pointes.  Last Qtc interval was checked on 8/27 and was 400 ms (however, patient was in atrial fibrillation at the time).  Stop date in place for ciprofloxacin for 8/30 as per ID recommendations. Discussed with Dr. Lupita Leash - will keep ciprofloxacin PO to complete course as patient prefers to have few IV meds.  Will continue to monitor Qtc at least daily while on ciprofloxacin and keep potassium >4 and magnesium >2  Amiodarone is metabolized by the cytochrome P450 system and therefore has the potential to cause many drug interactions. Amiodarone has an average plasma half-life of 50 days (range 20 to 100 days).   There is potential for drug interactions to occur several weeks or months after stopping treatment and the onset of drug interactions may be slow after initiating amiodarone.   []  Statins: Increased risk of myopathy. Simvastatin- restrict dose to 20mg  daily. Other statins: counsel patients to report any muscle pain or weakness immediately.  [x]  Anticoagulants: Amiodarone can increase anticoagulant effect. Consider warfarin dose reduction. Patients should be monitored closely and the dose of anticoagulant altered accordingly, remembering that amiodarone levels take several weeks to stabilize.  []  Antiepileptics: Amiodarone can increase plasma concentration of phenytoin, the dose should be reduced. Note that small changes in phenytoin dose can result in large changes in levels. Monitor patient and counsel on signs of toxicity.  []  Beta blockers: increased risk of bradycardia, AV block and myocardial depression. Sotalol - avoid concomitant use.  []   Calcium channel blockers (diltiazem and verapamil): increased  risk of bradycardia, AV block and myocardial depression.  []   Cyclosporine: Amiodarone increases levels of cyclosporine. Reduced dose of cyclosporine is recommended.  []  Digoxin dose should be halved when amiodarone is started.  []  Diuretics: increased risk of cardiotoxicity if hypokalemia occurs.  []  Oral hypoglycemic agents (glyburide, glipizide, glimepiride): increased risk of hypoglycemia. Patient's glucose levels should be monitored closely when initiating amiodarone therapy.   [x]  Drugs that prolong the QT interval:  Torsades de pointes risk may be increased with concurrent use - avoid if possible.  Monitor QTc, also keep magnesium/potassium WNL if concurrent therapy can't be avoided.  Antibiotics: e.g. fluoroquinolones, erythromycin.  Antiarrhythmics: e.g. quinidine, procainamide, disopyramide, sotalol.  Antipsychotics: e.g. phenothiazines, haloperidol.   Lithium, tricyclic antidepressants, and methadone.  Thank You,   Dimple Nanas, PharmD 10/11/2020 8:24 AM

## 2020-10-11 NOTE — Progress Notes (Addendum)
Progress Note  Patient Name: John [name] Date of Encounter: 10/11/2020  Primary Cardiologist: Fransico Him, MD  Subjective   Denies any chest pain or palpitations today. Started on Amio 200mg  BID for HR control yesterday with Pulmonary OK  Inpatient Medications    Scheduled Meds:  amiodarone  200 mg Oral BID   ciprofloxacin  750 mg Oral BID   metoprolol tartrate  12.5 mg Oral Once   metoprolol tartrate  25 mg Oral BID   pantoprazole  40 mg Oral QHS   PARoxetine  10 mg Oral Daily   potassium chloride  40 mEq Oral Q3H   predniSONE  10 mg Oral Q breakfast   rivaroxaban  20 mg Oral Q supper   vancomycin  125 mg Oral BID   Continuous Infusions:  PRN Meds: acetaminophen **OR** acetaminophen, magic mouthwash, menthol-cetylpyridinium   Vital Signs    Vitals:   10/10/20 1614 10/10/20 2047 10/10/20 2102 10/11/20 0502  BP: 94/78 100/72 111/73 107/75  Pulse: 95 (!) 120 (!) 106 100  Resp: 20 18  18   Temp: 98.4 F (36.9 C) 98.3 F (36.8 C)  99.5 F (37.5 C)  TempSrc: Oral Oral  Oral  SpO2: 95% 93%  95%  Weight:      Height:        Intake/Output Summary (Last 24 hours) at 10/11/2020 0813 Last data filed at 10/10/2020 2200 Gross per 24 hour  Intake 1320 ml  Output --  Net 1320 ml    Last 3 Weights 10/05/2020 09/30/2020 09/22/2020  Weight (lbs) 204 lb 208 lb 204 lb  Weight (kg) 92.534 kg 94.348 kg 92.534 kg     Telemetry    Atrial fibrillation with RVR in the 120's - Personally Reviewed  Physical Exam   GEN: Well nourished, well developed in no acute distress HEENT: Normal NECK: No JVD; No carotid bruits LYMPHATICS: No lymphadenopathy CARDIAC:irregularly irregular and tachy, no murmurs, rubs, gallops RESPIRATORY:  Clear to auscultation without rales, wheezing or rhonchi  ABDOMEN: Soft, non-tender, non-distended MUSCULOSKELETAL:  No edema; No deformity  SKIN: Warm and dry NEUROLOGIC:  Alert and oriented x 3 PSYCHIATRIC:  Normal affect   Labs    High  Sensitivity Troponin:  No results for input(s): TROPONINIHS in the last 720 hours.    Cardiac EnzymesNo results for input(s): TROPONINI in the last 168 hours. No results for input(s): TROPIPOC in the last 168 hours.   Chemistry Recent Labs  Lab 10/06/20 0430 10/07/20 0443 10/08/20 0508 10/09/20 0437 10/11/20 0542  NA 133* 138 138 140 139  K 3.8 3.6 3.2* 3.5 2.9*  CL 102 109 113* 112* 106  CO2 25 22 22 23 26   GLUCOSE 118* 153* 130* 138* 85  BUN 14 17 12 11 13   CREATININE 1.01 1.12 0.80 0.93 0.81  CALCIUM 7.9* 8.0* 7.8* 8.1* 7.9*  PROT 5.7* 5.5* 5.3*  --   --   ALBUMIN 2.4* 2.3* 2.2*  --   --   AST 27 19 18   --   --   ALT 23 21 22   --   --   ALKPHOS 53 46 46  --   --   BILITOT 1.3* 0.8 0.4  --   --   GFRNONAA >60 >60 >60 >60 >60  ANIONGAP 6 7 3* 5 7      Hematology Recent Labs  Lab 10/09/20 0437 10/10/20 0509 10/11/20 0542  WBC 1.6* 1.8* PENDING  RBC 3.34* 3.76* 3.26*  HGB 9.9* 11.1* 9.6*  HCT 30.3* 34.0* 29.5*  MCV 90.7 90.4 90.5  MCH 29.6 29.5 29.4  MCHC 32.7 32.6 32.5  RDW 16.3* 16.1* 16.0*  PLT 197 218 183     BNPNo results for input(s): BNP, PROBNP in the last 168 hours.   DDimer No results for input(s): DDIMER in the last 168 hours.   Radiology    DG Chest Port 1 View  Result Date: 10/10/2020 CLINICAL DATA:  60 year old male with metastatic lung cancer. Cough. EXAM: PORTABLE CHEST 1 VIEW COMPARISON:  Chest CTA 10/06/2020 and earlier. FINDINGS: Portable AP semi upright view at 1002 hours. 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 Stable lung volumes and mediastinal contours. Visualized tracheal air column is within normal limits. No pneumothorax. No definite pleural effusion. Left lung remains stable. No acute osseous abnormality identified. 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 No new cardiopulmonary abnormality. Electronically Signed   By: Genevie Ann M.D.   On: 10/10/2020 10:35    Cardiac Studies   2d echo 05/12/20  1. Left ventricular ejection fraction, by estimation, is 60 to 65%. The  left ventricle has normal function. The left ventricle has no regional  wall motion abnormalities. Left ventricular diastolic parameters are  indeterminate.   2. Right ventricular systolic function is normal. The right ventricular  size is normal. Tricuspid regurgitation signal is inadequate for assessing  PA pressure.   3. The mitral valve is normal in structure. Trivial mitral valve  regurgitation. No evidence of mitral stenosis.   4. The aortic valve is tricuspid. Aortic valve regurgitation is not  visualized. No aortic stenosis is present.   5. The inferior vena cava is normal in size with greater than 50%  respiratory variability, suggesting right atrial pressure of 3 mmHg.   6. The patient is in atrial fibrillation.   Patient Profile     60 y.o. 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 Dx with PAF in the setting of acute illness 04/2020, on amiodarone until stopped 07/2020 (CT scan changes at that time). This admission was admitted with fever and vomiting. He was found to have pseudomonal bacteremia, neuropenic fever, hypomagnesemia, hypokalemia, hyponatremia, and hypotension. HIV antibody was reactive but subsequent confirmatory testing unrevealing. He went into atrial fib RVR shortly after breathing treatment ([id number]).   Assessment & Plan    1. Neutropenic fever with pseudomonal bacteremia/sepsis - multiple issues on admission including abnormal CBC, electrolyte abnormalities, hypoalbuminemia, clinically improved - CT findings as noted   2. Paroxysmal atrial fib RVR  -  patient feels incited by albuterol rx, feeling better with Xopenex - also has known pre-disposition to atrial fib as noted before and also in the context of numerous physiologic stressors so challenging to know for certain, but added albuterol to list of meds to avoid in the future - CT also shows evidence of splenic infarcts which potentially raises CHADSVASC to 3 (atherosclerosis on CT, infarct event) - [name] has recommended initiation of anticoagulation due to thromboembolic risk with [id number] score 3 (Splenic infarct and coronary Ca on CT). - amiodarone previously d/c'd 07/2020 in setting of NSR / CT changes (?pulmonary toxicity) although on CT this admission those changes are favored to be post-radiation  -Discussed with Pulmonary and ok'd to  restart Amio >> now on Amio 200mg  BID for HR control - baseline QTC on sinus EKGs is >418ms so does not seem dofetilide or Sotolol is an option - HR still elevated this am - BP too soft to uptitrate BB - discussed TEE/DCCV with him yesterday and he is adamant that he does not want any procedures - continue Lopressor 25mg  BID - Change PO Amio to IV to try to get better HR control  3. Aortic atherosclerosis, coronary atherosclerosis - incidental pickup on CT (not unusual finding, but taking note given CHADSVASC score) - no ischemic symptoms - consider statin rx as outpatient - given patient's sensitivity to medications would prefer one focus at a time  I have spent a total of 30 minutes with patient reviewing Pulmonary consult , telemetry, EKGs, labs and examining patient as well as establishing an assessment and plan that was discussed with the patient.  > 50% of time was spent in direct patient care.     For questions or updates, please contact [place] Please consult [email or web address] for contact info under Cardiology/STEMI.  Signed, Fransico Him, MD 10/11/2020, 8:13 AM

## 2020-10-11 NOTE — Plan of Care (Signed)
  Problem: Clinical Measurements: Goal: Will remain free from infection Outcome: Progressing Goal: Respiratory complications will improve Outcome: Progressing Goal: Cardiovascular complication will be avoided Outcome: Progressing   Problem: Nutrition: Goal: Adequate nutrition will be maintained Outcome: Adequate for Discharge

## 2020-10-11 NOTE — Progress Notes (Signed)
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 fib not on anticoagulation due to low score, GERD, history of CAD, on Toprol admitted with With neutropenic fever, sepsis with SIRS criteria   In the ED febrile 100.1, hyponatremic neutropenic.  Placed on vancomycin/cefepime. Chest x-ray nonacute de-escalation could be radiation pneumonitis versus superimposed infection, normal lactic acid, UA no evidence of UTI, COVID-19 negative. CT chest: Increasingly conspicuous lymph nodes in the right hilar, mediastinal, lower right cervical and supraclavicular nodal stations concerning for potential metastatic disease, although some of this could be simply reactive . Satellite nodule measuring 11 mm in the right upper lobe, stable compared to the prior study.Right adrenal metastasis. 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 Granix ordered but patient declined given previous intolerance. WBC count recently improving.  Patient found to have Pseudomonas sepsis-based on the sensitivity patient can go on Cipro through 8/28 and oral vancomycin until 9/4 for secondary C Diff prophylaxis. Patient had A. fib with RVR, seen by cardiology, placed on amiodarone 8/26 after pulmonary clearance  Subjective:  Seen examined this am Overnight no fever, WBC count pending, potassium low-replacement ordered. Heart rate poorly controlled seen by cardiology this morning and starting amiodarone.  Assessment & Plan:  Neutropenic  fever Pseudomonas sepsis POA Immunosuppressed on immunotherapy and on long-term steroid for RA: No clear source for bacteremia.continue Cipro through 8/28 and oral vancomycin until 9/4 for secondary C Diff prophylaxis 2/2 hx of C DIFF in march.  Neutropenia with anemia, thrombocytopenia and neutropenia:Not due to immunotherapy per oncology.He has predated pancytopenia prior to his immunotherapy likely chronic component.  Drop suspected due to his sepsis also has splenomegaly.[place] dropped this am-but no fever. Recent Labs  Lab 10/07/20 0443 10/08/20 0508 10/09/20 0437 10/10/20 0509 10/11/20 0542  WBC 1.3* 1.4* 1.6* 1.8* 1.3*   PAF with RVR-heart rate remains poorly controlled.Appreciate cardiology input,placed on a  Amio after pulmonary clearance 8/26.  Blood pressure too soft to increase beta-blocker, cardiology changing p.o. to IV, patient declined  TEE DCCV. Cont metoprolol 25 mg bid. Monitor in tele. also placed on Xarelto continue per cardiology- CHADS2VASC2 score is 3 as per [place] per cardiology.    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 fib shortness of breath.  Continue prednisone.  Continue bronchodilators with Xopenex avoid albuterol.  Repeat chest x-ray  8/26-stable posttreatment appearance of right lung with presumed mostly radiation pneumonitis.  Cavitary right lung mass: Pulmonary consulted could be source of bacteremia. SCC stage IV lung SCC with adrenal metastasis on immunotherapy-Dr Ennever ollowing.  CT chest shows lymph nodes in the right hilar mediastinal nor right cervical and supraclavicular node-?Potentially metastatic disease versus reactive lymph nodes.   Hypokalemia: Replacement ordered  Hypomagnesemia: 1.8.  Repleting this morning.    Hypotension- BP stable.  Hyponatremia:suspect volume depletion. Resolved w ivf.   RA on long-term prednisone-s/p stress dose, cont po prednisone 10 mg 8/26  HIV  Ab 1/2 AB: Came back negative. On admission screening test positive but HIV 1/2 AB  is negative.   Diet Order             Diet Heart Room service appropriate? Yes; Fluid consistency: Thin  Diet effective now                 Patient's Body mass index is 29.27 kg/m. DVT prophylaxis: XARELTO Code Status:   Code Status: Full Code  Family Communication: plan of care discussed with patient at bedside. Status is: Inpatient Remains inpatient appropriate because:Inpatient level of care appropriate due to severity of illness Dispo: The patient is from: Home              Anticipated d/c is to: Home              Patient currently is not medically stable to d/c.   Difficult to place patient No Unresulted Labs (From admission, onward)     Start     Ordered   10/11/20 1400  Potassium  Once-Timed,   TIMED       Question:  Specimen collection method  Answer:  Lab=Lab collect   10/11/20 0848   10/09/20 0500  CBC with Differential/Platelet  Daily,   R      10/08/20 0945   10/06/20 0114  Expectorated Sputum Assessment w Gram Stain, Rflx to Resp Cult  Once,   R        10/06/20 0113            Medications reviewed: Scheduled Meds:  amiodarone  150 mg Intravenous Once   ciprofloxacin  750 mg Oral BID   metoprolol tartrate  12.5 mg Oral Once   metoprolol tartrate  25 mg Oral BID   pantoprazole  40 mg Oral QHS   PARoxetine  10 mg Oral Daily   predniSONE  10 mg Oral Q breakfast   rivaroxaban  20 mg Oral Q supper   vancomycin  125 mg Oral BID   Continuous Infusions:  amiodarone 60 mg/hr (10/11/20 1141)   Followed by   amiodarone     Consultants:see note  Procedures:see note Antimicrobials: Anti-infectives (From admission, onward)    Start     Dose/Rate Route Frequency Ordered Stop   10/11/20 0930  ciprofloxacin (CIPRO) tablet 750 mg        750 mg Oral 2 times daily 10/11/20 0831 10/14/20 2359   10/09/20 2200  vancomycin (VANCOCIN) capsule 125 mg        125 mg Oral 2 times daily  10/09/20 1334     10/09/20 1400  ciprofloxacin (CIPRO) tablet 750 mg  Status:  Discontinued        750 mg Oral 2 times daily 10/09/20 0903 10/11/20 0831   10/07/20 2200  vancomycin (VANCOCIN) capsule 125 mg  Status:  Discontinued        125 mg Oral 2 times daily 10/07/20 1718 10/09/20 0719   10/06/20 2000  vancomycin (VANCOREADY) IVPB 1500 mg/300 mL  Status:  Discontinued        1,500 mg 150 mL/hr over 120 Minutes Intravenous Every 24 hours 10/05/20 2149 10/06/20 1046   10/06/20 1200  vancomycin (VANCOREADY) IVPB 1250 mg/250 mL  Status:  Discontinued        1,250 mg 166.7 mL/hr over 90 Minutes Intravenous Every 12 hours 10/06/20 1046 10/07/20 1427   10/06/20 0600  ceFEPIme (MAXIPIME) 2 g in sodium chloride 0.9 % 100 mL IVPB  Status:  Discontinued        2 g 200 mL/hr over 30 Minutes Intravenous  Every 8 hours 10/05/20 2149 10/09/20 0903   10/05/20 2030  ceFEPIme (MAXIPIME) 2 g in sodium chloride 0.9 % 100 mL IVPB        2 g 200 mL/hr over 30 Minutes Intravenous  Once 10/05/20 2017 10/05/20 2216   10/05/20 2030  vancomycin (VANCOCIN) IVPB 1000 mg/200 mL premix  Status:  Discontinued        1,000 mg 200 mL/hr over 60 Minutes Intravenous  Once 10/05/20 2017 10/05/20 2029   10/05/20 2030  vancomycin (VANCOCIN) IVPB 1000 mg/200 mL premix        1,000 mg 200 mL/hr over 60 Minutes Intravenous Every hour 10/05/20 2029 10/05/20 2319      Culture/Microbiology    Component Value Date/Time   SDES  10/05/2020 2215    IN/OUT CATH URINE Performed at [facility], [address] [place], [place] [address]    [facility]  10/05/2020 2215    NONE Performed at [facility], [address], [place], [place] [address]    CULT  10/05/2020 2215    NO GROWTH Performed at [facility], [place] [address], [place], [place] [address]    REPTSTATUS 10/07/2020 FINAL 10/05/2020 2215    Other culture-see note  Objective: Vitals: Today's Vitals   10/11/20 0502 10/11/20 1140  10/11/20 1202 10/11/20 1300  BP: 107/75 110/74 106/75 107/74  Pulse: 100 97 76 (!) 101  Resp: 18  (!) 24 18  Temp: 99.5 F (37.5 C) 98.3 F (36.8 C)  98.3 F (36.8 C)  TempSrc: Oral Oral  Oral  SpO2: 95% 97% 96% 98%  Weight:      Height:      PainSc:        Intake/Output Summary (Last 24 hours) at 10/11/2020 1340 Last data filed at 10/11/2020 0900 Gross per 24 hour  Intake 1100 ml  Output --  Net 1100 ml   Filed Weights   10/05/20 1956  Weight: 92.5 kg   Weight change:   Intake/Output from previous day: 08/26 0701 - 08/27 0700 In: 1320 [P.O.:1320] Out: -  Intake/Output this shift: Total I/O In: 500 [P.O.:500] Out: -  Filed Weights   10/05/20 1956  Weight: 92.5 kg   Examination: General exam: AAOx3, pleasant. HEENT:Oral mucosa moist, Ear/Nose WNL grossly, dentition normal. Respiratory system: bilaterally diminished with mild wheezing, no use of accessory muscle. Cardiovascular system: S1 & S2 +,irregularly irregular,No JVD. Gastrointestinal system: Abdomen soft, NT,ND, BS+ Nervous System:Alert, awake, moving extremities and grossly nonfocal Extremities: no+ edema, distal peripheral pulses palpable.  Skin: No rashes,no icterus. MSK: Normal muscle bulk,tone, power   Data Reviewed: I have personally reviewed following labs and imaging studies CBC: Recent Labs  Lab 10/05/20 2040 10/06/20 0430 10/07/20 0443 10/08/20 0508 10/09/20 0437 10/10/20 0509 10/11/20 0542  WBC 1.8* 1.4* 1.3* 1.4* 1.6* 1.8* 1.3*  NEUTROABS 0.6* 0.4*  --   --  0.7* 0.6* 0.3*  HGB 11.8* 9.3* 9.0* 9.1* 9.9* 11.1* 9.6*  HCT 34.7* 28.3* 27.2* 28.5* 30.3* 34.0* 29.5*  MCV 87.2 89.6 89.2 91.3 90.7 90.4 90.5  PLT 175 132* 160 181 197 218 914   Basic Metabolic Panel: Recent Labs  Lab 10/06/20 0139 10/06/20 0430 10/07/20 0443 10/08/20 0508 10/09/20 0437 10/11/20 0542  NA  --  133* 138 138 140 139  K  --  3.8 3.6 3.2* 3.5 2.9*  CL  --  102 109 113* 112* 106  CO2  --  25 22 22  23  26   GLUCOSE  --  118* 153* 130* 138* 85  BUN  --  14 17 12 11 13   CREATININE  --  1.01 1.12 0.80 0.93 0.81  CALCIUM  --  7.9* 8.0* 7.8* 8.1* 7.9*  MG 1.5* 1.5* 2.1  --   --  1.8  PHOS 3.9  --   --   --   --   --    GFR: Estimated Creatinine Clearance: 112.2 mL/min (by C-G formula based on SCr of 0.81 mg/dL). Liver Function Tests: Recent Labs  Lab 10/05/20 2040 10/06/20 0430 10/07/20 0443 10/08/20 0508  AST 36 27 19 18   ALT 26 23 21 22   ALKPHOS 74 53 46 46  BILITOT 1.3* 1.3* 0.8 0.4  PROT 6.8 5.7* 5.5* 5.3*  ALBUMIN 2.9* 2.4* 2.3* 2.2*   No results for input(s): LIPASE, AMYLASE in the last 168 hours. No results for input(s): AMMONIA in the last 168 hours. Coagulation Profile: Recent Labs  Lab 10/05/20 2040  INR 1.1   Cardiac Enzymes: No results for input(s): CKTOTAL, CKMB, CKMBINDEX, TROPONINI in the last 168 hours. BNP (last 3 results) No results for input(s): PROBNP in the last 8760 hours. HbA1C: No results for input(s): HGBA1C in the last 72 hours. CBG: Recent Labs  Lab 10/07/20 1117 10/09/20 2032  GLUCAP 134* 146*   Lipid Profile: Recent Labs    10/10/20 0509  CHOL 133  HDL 22*  LDLCALC 94  TRIG 87  CHOLHDL 6.0   Thyroid Function Tests: No results for input(s): TSH, T4TOTAL, FREET4, T3FREE, THYROIDAB in the last 72 hours. Anemia Panel: No results for input(s): VITAMINB12, FOLATE, FERRITIN, TIBC, IRON, RETICCTPCT in the last 72 hours. Sepsis Labs: Recent Labs  Lab 10/05/20 2040 10/06/20 0046 10/06/20 0139 10/07/20 0443  PROCALCITON  --   --  7.93 5.34  LATICACIDVEN 1.7 1.0  --   --     Recent Results (from the past 240 hour(s))  Blood Culture (routine x 2)     Status: Abnormal   Collection Time: 10/05/20  8:17 PM   Specimen: Right Antecubital; Blood  Result Value Ref Range Status   Specimen Description   Final    RIGHT ANTECUBITAL Performed at [facility], [address] [place], [place] [address]    Special Requests   Final     BOTTLES DRAWN AEROBIC AND ANAEROBIC Blood Culture adequate volume Performed at [facility], [address] [place], [place] [address]    Culture  Setup Time   Final    GRAM NEGATIVE RODS AEROBIC BOTTLE ONLY CRITICAL RESULT CALLED TO, READ BACK BY AND VERIFIED WITH: E JACKSON,[email or web address]  10/06/21 [place] Performed at [facility], [place] [address], [place], [place] [address]    Culture PSEUDOMONAS AERUGINOSA (A)  Final   Report Status 10/09/2020 FINAL  Final   Organism ID, Bacteria PSEUDOMONAS AERUGINOSA  Final      Susceptibility   Pseudomonas aeruginosa - MIC*    CEFTAZIDIME 4 SENSITIVE Sensitive     CIPROFLOXACIN <=0.25 SENSITIVE Sensitive     GENTAMICIN <=1 SENSITIVE Sensitive     IMIPENEM 2 SENSITIVE Sensitive     PIP/TAZO 8 SENSITIVE Sensitive     CEFEPIME 2 SENSITIVE Sensitive     * PSEUDOMONAS AERUGINOSA  Blood Culture ID Panel (Reflexed)     Status: Abnormal   Collection Time: 10/05/20  8:17 PM  Result Value Ref Range Status   Enterococcus faecalis NOT DETECTED NOT DETECTED  Final   Enterococcus Faecium NOT DETECTED NOT DETECTED Final   Listeria monocytogenes NOT DETECTED NOT DETECTED Final   Staphylococcus species NOT DETECTED NOT DETECTED Final   Staphylococcus aureus (BCID) NOT DETECTED NOT DETECTED Final   Staphylococcus epidermidis NOT DETECTED NOT DETECTED Final   Staphylococcus lugdunensis NOT DETECTED NOT DETECTED Final   Streptococcus species NOT DETECTED NOT DETECTED Final   Streptococcus agalactiae NOT DETECTED NOT DETECTED Final   Streptococcus pneumoniae NOT DETECTED NOT DETECTED Final   Streptococcus pyogenes NOT DETECTED NOT DETECTED Final   A.calcoaceticus-baumannii NOT DETECTED NOT DETECTED Final   Bacteroides fragilis NOT DETECTED NOT DETECTED Final   Enterobacterales NOT DETECTED NOT DETECTED Final   Enterobacter cloacae complex NOT DETECTED NOT DETECTED Final   Escherichia coli NOT DETECTED NOT DETECTED Final   Klebsiella aerogenes  NOT DETECTED NOT DETECTED Final   Klebsiella oxytoca NOT DETECTED NOT DETECTED Final   Klebsiella pneumoniae NOT DETECTED NOT DETECTED Final   Proteus species NOT DETECTED NOT DETECTED Final   Salmonella species NOT DETECTED NOT DETECTED Final   Serratia marcescens NOT DETECTED NOT DETECTED Final   Haemophilus influenzae NOT DETECTED NOT DETECTED Final   Neisseria meningitidis NOT DETECTED NOT DETECTED Final   Pseudomonas aeruginosa DETECTED (A) NOT DETECTED Final    Comment: CRITICAL RESULT CALLED TO, READ BACK BY AND VERIFIED WITH: E JACKSON,[id number]  10/07/20 [place]    Stenotrophomonas maltophilia NOT DETECTED NOT DETECTED Final   Candida albicans NOT DETECTED NOT DETECTED Final   Candida auris NOT DETECTED NOT DETECTED Final   Candida glabrata NOT DETECTED NOT DETECTED Final   Candida krusei NOT DETECTED NOT DETECTED Final   Candida parapsilosis NOT DETECTED NOT DETECTED Final   Candida tropicalis NOT DETECTED NOT DETECTED Final   Cryptococcus neoformans/gattii NOT DETECTED NOT DETECTED Final   CTX-M ESBL NOT DETECTED NOT DETECTED Final   Carbapenem resistance IMP NOT DETECTED NOT DETECTED Final   Carbapenem resistance KPC NOT DETECTED NOT DETECTED Final   Carbapenem resistance NDM NOT DETECTED NOT DETECTED Final   Carbapenem resistance VIM NOT DETECTED NOT DETECTED Final    Comment: Performed at [facility], [address] [place], [place] [address]  Blood Culture (routine x 2)     Status: None   Collection Time: 10/05/20  8:22 PM   Specimen: BLOOD RIGHT HAND  Result Value Ref Range Status   Specimen Description   Final    BLOOD RIGHT HAND Performed at [facility], [address] [place], [place] [address]    Special Requests   Final    BOTTLES DRAWN AEROBIC AND ANAEROBIC Blood Culture results may not be optimal due to an inadequate volume of blood received in culture bottles Performed at [facility], [address] [place], [place] [address]     Culture   Final    NO GROWTH 5 DAYS Performed at [facility], [place] [address] [place], [place] [address]    Report Status 10/11/2020 FINAL  Final  Resp Panel by RT-PCR (Flu A&B, Covid) Nasopharyngeal Swab     Status: None   Collection Time: 10/05/20  8:40 PM   Specimen: Nasopharyngeal Swab; Nasopharyngeal(NP) swabs in vial transport medium  Result Value Ref Range Status   SARS Coronavirus 2 by RT PCR NEGATIVE NEGATIVE Final    Comment: (NOTE) SARS-CoV-2 target nucleic acids are NOT DETECTED.  The SARS-CoV-2 RNA is generally detectable in upper respiratory specimens during the acute phase of infection.  The lowest concentration of SARS-CoV-2 viral copies this assay can detect is 138 copies/mL. A negative result does not preclude SARS-Cov-2 infection and should not be used as the sole basis for treatment or other patient management decisions. A negative result may occur with  improper specimen collection/handling, submission of specimen other than nasopharyngeal swab, presence of viral mutation(s) within the areas targeted by this assay, and inadequate number of viral copies(<138 copies/mL). A negative result must be combined with clinical observations, patient history, and epidemiological information. The expected result is Negative.  Fact Sheet for Patients:  EntrepreneurPulse.com.au  Fact Sheet for Healthcare Providers:  IncredibleEmployment.be  This test is no t yet approved or cleared by the [place] FDA and  has been authorized for detection and/or diagnosis of SARS-CoV-2 by FDA under an Emergency Use Authorization (EUA). This EUA will remain  in effect (meaning this test can be used) for the duration of the COVID-19 declaration under Section 564(b)(1) of the Act, 21 U.S.C.section 360bbb-3(b)(1), unless the authorization is terminated  or revoked sooner.       Influenza A by PCR NEGATIVE NEGATIVE Final   Influenza B by PCR  NEGATIVE NEGATIVE Final    Comment: (NOTE) The Xpert Xpress SARS-CoV-2/FLU/RSV plus assay is intended as an aid in the diagnosis of influenza from Nasopharyngeal swab specimens and should not be used as a sole basis for treatment. Nasal washings and aspirates are unacceptable for Xpert Xpress SARS-CoV-2/FLU/RSV testing.  Fact Sheet for Patients: EntrepreneurPulse.com.au  Fact Sheet for Healthcare Providers: IncredibleEmployment.be  This test is not yet approved or cleared by the [place] FDA and has been authorized for detection and/or diagnosis of SARS-CoV-2 by FDA under an Emergency Use Authorization (EUA). This EUA will remain in effect (meaning this test can be used) for the duration of the COVID-19 declaration under Section 564(b)(1) of the Act, 21 U.S.C. section 360bbb-3(b)(1), unless the authorization is terminated or revoked.  Performed at [facility], [address] [place], [place] [address]   Urine Culture     Status: None   Collection Time: 10/05/20 10:15 PM   Specimen: In/Out Cath Urine  Result Value Ref Range Status   Specimen Description   Final    IN/OUT CATH URINE Performed at [facility], [address] [place], [place] [address]    Special Requests   Final    NONE Performed at [facility], [address] [place], [place] [address]    Culture   Final    NO GROWTH Performed at [facility], [place] [address], [place], [place] [address]    Report Status 10/07/2020 FINAL  Final    Radiology Studies: DG Chest Port 1 View  Result Date: 10/10/2020 CLINICAL DATA:  60 year old male with metastatic lung cancer. Cough. EXAM: PORTABLE CHEST 1 VIEW COMPARISON:  Chest CTA 10/06/2020 and earlier. FINDINGS: Portable AP semi upright view at 1002 hours. Confluent opacity persists throughout the right mid lung, with mixed cavitary and consolidative changes on the recent CT thought mostly to  reflect radiation pneumonitis. Stable lung volumes and mediastinal contours. Visualized tracheal air column is within normal limits. No pneumothorax. No definite pleural effusion. Left lung remains stable. No acute osseous abnormality identified. IMPRESSION: Stable post treatment appearance of the right lung with presumed mostly radiation pneumonitis on recent CT. No new cardiopulmonary abnormality. Electronically Signed   By: Genevie Ann M.D.   On: 10/10/2020 10:35     LOS:  5 days   Antonieta Pert, MD [facility]  10/11/2020, 1:40 PM

## 2020-10-12 DIAGNOSIS — I952 Hypotension due to drugs: Secondary | ICD-10-CM | POA: Diagnosis not present

## 2020-10-12 DIAGNOSIS — I480 Paroxysmal atrial fibrillation: Secondary | ICD-10-CM | POA: Diagnosis not present

## 2020-10-12 DIAGNOSIS — I4891 Unspecified atrial fibrillation: Secondary | ICD-10-CM | POA: Diagnosis not present

## 2020-10-12 LAB — BASIC METABOLIC PANEL
Anion gap: 4 — ABNORMAL LOW (ref 5–15)
BUN: 11 mg/dL (ref 6–20)
CO2: 30 mmol/L (ref 22–32)
Calcium: 7.8 mg/dL — ABNORMAL LOW (ref 8.9–10.3)
Chloride: 104 mmol/L (ref 98–111)
Creatinine, Ser: 0.86 mg/dL (ref 0.61–1.24)
GFR, Estimated: >60 mL/min (ref >60)
Glucose, Bld: 101 mg/dL — ABNORMAL HIGH (ref 70–99)
Potassium: 3.7 mmol/L (ref 3.5–5.1)
Sodium: 138 mmol/L (ref 135–145)

## 2020-10-12 LAB — CBC WITH DIFFERENTIAL/PLATELET
Abs Immature Granulocytes: 0.01 10*3/uL (ref 0.00–0.07)
Basophils Absolute: 0 10*3/uL (ref 0.0–0.1)
Basophils Relative: 1 %
Eosinophils Absolute: 0.1 10*3/uL (ref 0.0–0.5)
Eosinophils Relative: 8 %
HCT: 32.6 % — ABNORMAL LOW (ref 39.0–52.0)
Hemoglobin: 10.4 g/dL — ABNORMAL LOW (ref 13.0–17.0)
Immature Granulocytes: 1 %
Lymphocytes Relative: 43 %
Lymphs Abs: 0.5 10*3/uL — ABNORMAL LOW (ref 0.7–4.0)
MCH: 29.1 pg (ref 26.0–34.0)
MCHC: 31.9 g/dL (ref 30.0–36.0)
MCV: 91.1 fL (ref 80.0–100.0)
Monocytes Absolute: 0.2 10*3/uL (ref 0.1–1.0)
Monocytes Relative: 20 %
Neutro Abs: 0.3 10*3/uL — CL (ref 1.7–7.7)
Neutrophils Relative %: 27 %
Platelets: 200 10*3/uL (ref 150–400)
RBC: 3.58 MIL/uL — ABNORMAL LOW (ref 4.22–5.81)
RDW: 15.9 % — ABNORMAL HIGH (ref 11.5–15.5)
WBC: 1.2 10*3/uL — CL (ref 4.0–10.5)
nRBC: 0 % (ref 0.0–0.2)

## 2020-10-12 LAB — MAGNESIUM: Magnesium: 2 mg/dL (ref 1.7–2.4)

## 2020-10-12 MED ORDER — POTASSIUM CHLORIDE CRYS ER 20 MEQ PO TBCR
40.0000 meq | EXTENDED_RELEASE_TABLET | Freq: Once | ORAL | Status: AC
  Administered 2020-10-12: 40 meq via ORAL
  Filled 2020-10-12: qty 2

## 2020-10-12 NOTE — Progress Notes (Signed)
Progress Note  Patient Name: John [name] Date of Encounter: 10/12/2020  Primary Cardiologist: Fransico Him, MD  Subjective   HR remains 110-130's on tele.  Denies any chest pain or SOB Inpatient Medications    Scheduled Meds:  ciprofloxacin  750 mg Oral BID   metoprolol tartrate  12.5 mg Oral Once   metoprolol tartrate  25 mg Oral BID   pantoprazole  40 mg Oral QHS   PARoxetine  10 mg Oral Daily   predniSONE  10 mg Oral Q breakfast   rivaroxaban  20 mg Oral Q supper   vancomycin  125 mg Oral BID   Continuous Infusions:  amiodarone 30 mg/hr (10/11/20 2352)   PRN Meds: acetaminophen **OR** acetaminophen, magic mouthwash, menthol-cetylpyridinium   Vital Signs    Vitals:   10/11/20 1300 10/11/20 1400 10/11/20 2053 10/12/20 0438  BP: 107/74 101/71 105/66 112/80  Pulse: (!) 101 (!) 47 (!) 104 97  Resp: 18 20  (!) 21  Temp: 98.3 F (36.8 C)  98.1 F (36.7 C) 97.9 F (36.6 C)  TempSrc: Oral  Oral Oral  SpO2: 98% 97%    Weight:      Height:        Intake/Output Summary (Last 24 hours) at 10/12/2020 0751 Last data filed at 10/12/2020 0600 Gross per 24 hour  Intake 1688.71 ml  Output 1450 ml  Net 238.71 ml   Last 3 Weights 10/05/2020 09/30/2020 09/22/2020  Weight (lbs) 204 lb 208 lb 204 lb  Weight (kg) 92.534 kg 94.348 kg 92.534 kg     Telemetry    Atrial fibrillation with RVR 110-130's - Personally Reviewed  Physical Exam   GEN: Well nourished, well developed in no acute distress HEENT: Normal NECK: No JVD; No carotid bruits LYMPHATICS: No lymphadenopathy CARDIAC:irregularly irregular and tachy, no murmurs, rubs, gallops RESPIRATORY:decreased BS at bases ABDOMEN: Soft, non-tender, non-distended MUSCULOSKELETAL:  No edema; No deformity  SKIN: Warm and dry NEUROLOGIC:  Alert and oriented x 3 PSYCHIATRIC:  Normal affect     High Sensitivity Troponin:  No results for input(s): TROPONINIHS in the last 720 hours.    Cardiac EnzymesNo results for input(s):  TROPONINI in the last 168 hours. No results for input(s): TROPIPOC in the last 168 hours.   Chemistry Recent Labs  Lab 10/06/20 0430 10/07/20 0443 10/08/20 0508 10/09/20 0437 10/11/20 0542 10/11/20 1553 10/12/20 0525  NA 133* 138 138 140 139  --  138  K 3.8 3.6 3.2* 3.5 2.9* 3.5 3.7  CL 102 109 113* 112* 106  --  104  CO2 25 22 22 23 26   --  30  GLUCOSE 118* 153* 130* 138* 85  --  101*  BUN 14 17 12 11 13   --  11  CREATININE 1.01 1.12 0.80 0.93 0.81  --  0.86  CALCIUM 7.9* 8.0* 7.8* 8.1* 7.9*  --  7.8*  PROT 5.7* 5.5* 5.3*  --   --   --   --   ALBUMIN 2.4* 2.3* 2.2*  --   --   --   --   AST 27 19 18   --   --   --   --   ALT 23 21 22   --   --   --   --   ALKPHOS 53 46 46  --   --   --   --   BILITOT 1.3* 0.8 0.4  --   --   --   --   GFRNONAA >60 >  60 >60 >60 >60  --  >60  ANIONGAP 6 7 3* 5 7  --  4*     Hematology Recent Labs  Lab 10/10/20 0509 10/11/20 0542 10/12/20 0525  WBC 1.8* 1.3* 1.2*  RBC 3.76* 3.26* 3.58*  HGB 11.1* 9.6* 10.4*  HCT 34.0* 29.5* 32.6*  MCV 90.4 90.5 91.1  MCH 29.5 29.4 29.1  MCHC 32.6 32.5 31.9  RDW 16.1* 16.0* 15.9*  PLT 218 183 200    BNPNo results for input(s): BNP, PROBNP in the last 168 hours.   DDimer No results for input(s): DDIMER in the last 168 hours.   Radiology    DG Chest Port 1 View  Result Date: 10/10/2020 CLINICAL DATA:  60 year old male with metastatic lung cancer. Cough. EXAM: PORTABLE CHEST 1 VIEW COMPARISON:  Chest CTA 10/06/2020 and earlier. FINDINGS: Portable AP semi upright view at 1002 hours. 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 Stable lung volumes and mediastinal contours. Visualized tracheal air column is within normal limits. No pneumothorax. No definite pleural effusion. Left lung remains stable. No acute osseous abnormality identified. 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 No new cardiopulmonary abnormality. Electronically Signed   By: Genevie Ann M.D.   On: 10/10/2020 10:35    Cardiac Studies   2d echo 05/12/20  1. Left ventricular ejection fraction, by estimation, is 60 to 65%. The  left ventricle has normal function. The left ventricle has no regional  wall motion abnormalities. Left ventricular diastolic parameters are  indeterminate.   2. Right ventricular systolic function is normal. The right ventricular  size is normal. Tricuspid regurgitation signal is inadequate for assessing  PA pressure.   3. The mitral valve is normal in structure. Trivial mitral valve  regurgitation. No evidence of mitral stenosis.   4. The aortic valve is tricuspid. Aortic valve regurgitation is not  visualized. No aortic stenosis is present.   5. The inferior vena cava is normal in size with greater than 50%  respiratory variability, suggesting right atrial pressure of 3 mmHg.   6. The patient is in atrial fibrillation.   Patient Profile     60 y.o. 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 Dx with PAF in the setting of acute illness 04/2020, on amiodarone until stopped 07/2020 (CT scan changes at that time). This admission was admitted with fever and vomiting. He was found to have pseudomonal bacteremia, neuropenic fever, hypomagnesemia, hypokalemia, hyponatremia, and hypotension. HIV antibody was reactive but subsequent confirmatory testing unrevealing. He went into atrial fib RVR shortly after breathing treatment ([id number]).   Assessment & Plan    1. Neutropenic fever with pseudomonal bacteremia/sepsis - multiple issues on admission including abnormal CBC, electrolyte abnormalities, hypoalbuminemia, clinically improved - CT findings as  noted   2. Paroxysmal atrial fib RVR  - patient feels incited by albuterol rx, feeling better with Xopenex - also has known pre-disposition to atrial fib as noted before and also in the context of numerous physiologic stressors so challenging to know for certain, but added albuterol to list of meds to avoid in the future - CT also shows evidence of splenic infarcts which potentially raises CHADSVASC to 3 (atherosclerosis on CT, infarct event) - [name] has recommended initiation of  anticoagulation due to thromboembolic risk with [id number] score 3 (Splenic infarct and coronary Ca on CT). - amiodarone previously d/c'd 07/2020 in setting of NSR / CT changes (?pulmonary toxicity) although on CT this admission those changes are favored to be post-radiation  -Discussed with Pulmonary and ok'd to restart Amio >> was on Amio 200mg  BID for HR control but due to persistently elevated HRs this was changed to IV Amio yesterday - still having uncontrolled rates on tele today - baseline QTC on sinus EKGs is >425ms so does not seem dofetilide or Sotolol is an option - BP too soft to uptitrate BB - discussed TEE/DCCV with him again today and he is still leery about going through with it and wants to wait another day of IV Amio - I will make him NPO after MN for possible TEE/DCCV tomorrow upon reassessment of tele in am - continue Lopressor 25mg  BID and IV Amio for rate control - will bolus with Amio 150mg  IV this am to try to HR down - continue Xarelto 20mg  daily -Shared Decision Making/Informed Consent The risks [stroke, cardiac arrhythmias rarely resulting in the need for a temporary or permanent pacemaker, skin irritation or burns, esophageal damage, perforation (1:10,000 risk), bleeding, pharyngeal hematoma as well as other potential complications associated with conscious sedation including aspiration, arrhythmia, respiratory failure and death], benefits (treatment guidance, restoration of normal sinus  rhythm, diagnostic support) and alternatives of a transesophageal echocardiogram guided cardioversion were discussed in detail with John [name] and he is willing to proceed.   3. Aortic atherosclerosis, coronary atherosclerosis - incidental pickup on CT (not unusual finding, but taking note given CHADSVASC score) - no ischemic symptoms - consider statin rx as outpatient - given patient's sensitivity to medications would prefer one focus at a time  I have spent a total of 30 minutes with patient reviewing Pulmonary consult , telemetry, EKGs, labs and examining patient as well as establishing an assessment and plan that was discussed with the patient.  > 50% of time was spent in direct patient care.     For questions or updates, please contact [facility] Please consult [email or web address] for contact info under Cardiology/STEMI.  Signed, Fransico Him, MD 10/12/2020, 7:51 AM

## 2020-10-12 NOTE — Plan of Care (Signed)
  Problem: Activity: Goal: Risk for activity intolerance will decrease Outcome: Progressing   Problem: Nutrition: Goal: Adequate nutrition will be maintained Outcome: Progressing   Problem: Safety: Goal: Ability to remain free from injury will improve Outcome: Progressing   

## 2020-10-12 NOTE — Progress Notes (Signed)
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 fib not on anticoagulation due to low score, GERD, history of CAD, on Toprol admitted with With neutropenic fever, sepsis with SIRS criteria   In the ED febrile 100.1, hyponatremic neutropenic.  Placed on vancomycin/cefepime. Chest x-ray nonacute de-escalation could be radiation pneumonitis versus superimposed infection, normal lactic acid, UA no evidence of UTI, COVID-19 negative. CT chest: Increasingly conspicuous lymph nodes in the right hilar, mediastinal, lower right cervical and supraclavicular nodal stations concerning for potential metastatic disease, although some of this could be simply reactive . Satellite nodule measuring 11 mm in the right upper lobe, stable compared to the prior study.Right adrenal metastasis. 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 Granix ordered but patient declined given previous intolerance. WBC count recently improving.  Patient found to have Pseudomonas sepsis-based on the sensitivity patient can go on Cipro through 8/28 and oral vancomycin until 9/4 for secondary C Diff prophylaxis. Patient had A. fib with RVR, seen by cardiology, placed on amiodarone 8/26 after pulmonary clearance. PO amio changed to IV  Subjective:  Patient reports he had accident this morning at 8 AM to go to the bathroom-following which he had uncontrolled A. fib Currently resting comfortably. Wbc at 1.2 No chest pain or shortness of  breath.  Assessment & Plan:  Neutropenic fever Pseudomonas sepsis POA Immunosuppressed on immunotherapy and on long-term steroid for RA: No clear source for bacteremia he is on Cipro and continuye through 8/28  and oral vancomycin until 9/4 for secondary C Diff prophylaxis 2/2 hx of C DIFF in march.  Neutropenia with anemia, thrombocytopenia and neutropenia:Not due to immunotherapy per oncology, ?felty's syndrome.He has predated pancytopenia prior to his immunotherapy likely chronic component.  Drop could be 2/2  his sepsis. Monitor wbc, onco following,he declined granix due to previous intolerance. Recent Labs  Lab 10/08/20 0508 10/09/20 0437 10/10/20 0509 10/11/20 0542 10/12/20 0525  WBC 1.4* 1.6* 1.8* 1.3* 1.2*   PAF with RVR-Appreciate cardiology input, continue Amiodarone iv ( after pulm clearance- BP too soft to increase beta-blocker,on metoprolol 25 mg bid.  Now willing to proceed w/ TEE DCCV.He is on Xarelto continue per cardiology- CHADS2VASC2 score is 3 as per [place] per cardiology.    COPD with asthma  Radiation pneumonitis Wheezing Having bouts of cough triggering A. fib shortness of breath.  On prednisone, xopenex,avoid albuterol.  Repeat chest x-ray  8/26-stable posttreatment appearance of right lung with presumed mostly radiation pneumonitis. PCCM cleared for amio use.  Cavitary right lung mass: Pulmonary consulted could be source of bacteremia. SCC stage IV lung SCC with adrenal metastasis on immunotherapy-Dr Ennever ollowing.  CT chest shows lymph nodes in the right hilar mediastinal nor right cervical and supraclavicular node-?Potentially metastatic disease versus reactive lymph nodes.   Hypokalemia: replete to keep >4  Hypomagnesemia: Reple  Hypotension- BP stable.  Hyponatremia:suspect volume depletion. Resolved w ivf.   RA on long-term prednisone-s/p stress dose, cont po prednisone 10 mg 8/26  HIV Ab 1/2 AB: Came back negative. On admission screening  test positive but HIV 1/2 AB is negative.   Diet Order             Diet Heart Room service appropriate? Yes; Fluid consistency: Thin  Diet effective now                 Patient's Body mass index is 29.27 kg/m. DVT prophylaxis: XARELTO Code Status:   Code Status: Full Code  Family Communication: plan of care discussed with patient at bedside. Status is: Inpatient Remains inpatient appropriate because:Inpatient level of care appropriate due to severity of illness Dispo: The patient is from: Home              Anticipated d/c is to: Home              Patient currently is not medically stable to d/c.   Difficult to place patient No  Unresulted Labs (From admission, onward)     Start     Ordered   10/12/20 7425  Basic metabolic panel  Daily,   R     Question:  Specimen collection method  Answer:  Lab=Lab collect   10/11/20 1645   10/09/20 0500  CBC with Differential/Platelet  Daily,   R      10/08/20 0945   10/06/20 0114  Expectorated Sputum Assessment w Gram Stain, Rflx to Resp Cult  Once,   R        10/06/20 0113            Medications reviewed: Scheduled Meds:  ciprofloxacin  750 mg Oral BID   metoprolol tartrate  12.5 mg Oral Once   metoprolol tartrate  25 mg Oral BID   pantoprazole  40 mg Oral QHS   PARoxetine  10 mg Oral Daily   predniSONE  10 mg Oral Q breakfast   rivaroxaban  20 mg Oral Q supper   vancomycin  125 mg Oral BID   Continuous Infusions:  amiodarone 30 mg/hr (10/11/20 2352)   Consultants:see note  Procedures:see note Antimicrobials: Anti-infectives (From admission, onward)    Start     Dose/Rate Route Frequency Ordered Stop   10/11/20 0930  ciprofloxacin (CIPRO) tablet 750 mg        750 mg Oral 2 times daily 10/11/20 0831 10/14/20 2359   10/09/20 2200  vancomycin (VANCOCIN) capsule 125 mg        125 mg Oral 2 times daily 10/09/20 1334     10/09/20 1400  ciprofloxacin (CIPRO) tablet 750 mg  Status:  Discontinued        750 mg Oral 2 times  daily 10/09/20 0903 10/11/20 0831   10/07/20 2200  vancomycin (VANCOCIN) capsule 125 mg  Status:  Discontinued        125 mg Oral 2 times daily 10/07/20 1718 10/09/20 0719   10/06/20 2000  vancomycin (VANCOREADY) IVPB 1500 mg/300 mL  Status:  Discontinued        1,500 mg 150 mL/hr over 120 Minutes Intravenous Every 24 hours 10/05/20 2149 10/06/20 1046   10/06/20 1200  vancomycin (VANCOREADY) IVPB 1250 mg/250 mL  Status:  Discontinued        1,250 mg 166.7 mL/hr over 90 Minutes Intravenous Every 12 hours 10/06/20 1046 10/07/20 1427   10/06/20 0600  ceFEPIme (MAXIPIME) 2 g in sodium chloride 0.9 % 100 mL IVPB  Status:  Discontinued        2 g 200 mL/hr over 30 Minutes Intravenous Every 8 hours 10/05/20 2149 10/09/20 0903   10/05/20  2030  ceFEPIme (MAXIPIME) 2 g in sodium chloride 0.9 % 100 mL IVPB        2 g 200 mL/hr over 30 Minutes Intravenous  Once 10/05/20 2017 10/05/20 2216   10/05/20 2030  vancomycin (VANCOCIN) IVPB 1000 mg/200 mL premix  Status:  Discontinued        1,000 mg 200 mL/hr over 60 Minutes Intravenous  Once 10/05/20 2017 10/05/20 2029   10/05/20 2030  vancomycin (VANCOCIN) IVPB 1000 mg/200 mL premix        1,000 mg 200 mL/hr over 60 Minutes Intravenous Every hour 10/05/20 2029 10/05/20 2319      Culture/Microbiology    Component Value Date/Time   SDES  10/05/2020 2215    IN/OUT CATH URINE Performed at [facility], [address] [place] Bhat [place], [place] [address]    [facility]  10/05/2020 2215    NONE Performed at [facility], [address] [place], [place] [address]    CULT  10/05/2020 2215    NO GROWTH Performed at [facility], [place] [address], [place], [place] [address]    REPTSTATUS 10/07/2020 FINAL 10/05/2020 2215    Other culture-see note  Objective: Vitals: Today's Vitals   10/11/20 1400 10/11/20 2053 10/11/20 2100 10/12/20 0438  BP: 101/71 105/66  112/80  Pulse: (!) 47 (!) 104  97  Resp: 20   (!) 21  Temp:  98.1 F  (36.7 C)  97.9 F (36.6 C)  TempSrc:  Oral  Oral  SpO2: 97%     Weight:      Height:      PainSc:   0-No pain     Intake/Output Summary (Last 24 hours) at 10/12/2020 0744 Last data filed at 10/12/2020 0600 Gross per 24 hour  Intake 1688.71 ml  Output 1450 ml  Net 238.71 ml    Filed Weights   10/05/20 1956  Weight: 92.5 kg   Weight change:   Intake/Output from previous day: 08/27 0701 - 08/28 0700 In: 1688.7 [P.O.:1460; I.V.:228.7] Out: 1450 [Urine:1450] Intake/Output this shift: No intake/output data recorded. Filed Weights   10/05/20 1956  Weight: 92.5 kg   Examination:  General exam: AAOx x3, pleasant. HEENT:Oral mucosa moist, Ear/Nose WNL grossly, dentition normal. Respiratory system: bilaterally diminished,  no use of accessory muscle Cardiovascular system: S1 & S2 +, irregularly irregular, no JVD,. Gastrointestinal system: Abdomen soft, NT,ND, BS+ Nervous System:Alert, awake, moving extremities and grossly nonfocal Extremities: no edema, distal peripheral pulses palpable.  Skin: No rashes,no icterus. MSK: Normal muscle bulk,tone, power   Data Reviewed: I have personally reviewed following labs and imaging studies CBC: Recent Labs  Lab 10/06/20 0430 10/07/20 0443 10/08/20 0508 10/09/20 0437 10/10/20 0509 10/11/20 0542 10/12/20 0525  WBC 1.4*   < > 1.4* 1.6* 1.8* 1.3* 1.2*  NEUTROABS 0.4*  --   --  0.7* 0.6* 0.3* 0.3*  HGB 9.3*   < > 9.1* 9.9* 11.1* 9.6* 10.4*  HCT 28.3*   < > 28.5* 30.3* 34.0* 29.5* 32.6*  MCV 89.6   < > 91.3 90.7 90.4 90.5 91.1  PLT 132*   < > 181 197 218 183 200   < > = values in this interval not displayed.    Basic Metabolic Panel: Recent Labs  Lab 10/06/20 0139 10/06/20 0430 10/07/20 0443 10/08/20 0508 10/09/20 0437 10/11/20 0542 10/11/20 1553 10/12/20 0525  NA  --  133* 138 138 140 139  --  138  K  --  3.8 3.6 3.2* 3.5 2.9* 3.5 3.7  CL  --  102 109 113* 112* 106  --  104  CO2  --  25 22 22 23 26   --  30  GLUCOSE   --  118* 153* 130* 138* 85  --  101*  BUN  --  14 17 12 11 13   --  11  CREATININE  --  1.01 1.12 0.80 0.93 0.81  --  0.86  CALCIUM  --  7.9* 8.0* 7.8* 8.1* 7.9*  --  7.8*  MG 1.5* 1.5* 2.1  --   --  1.8  --  2.0  PHOS 3.9  --   --   --   --   --   --   --     GFR: Estimated Creatinine Clearance: 105.7 mL/min (by C-G formula based on SCr of 0.86 mg/dL). Liver Function Tests: Recent Labs  Lab 10/05/20 2040 10/06/20 0430 10/07/20 0443 10/08/20 0508  AST 36 27 19 18   ALT 26 23 21 22   ALKPHOS 74 53 46 46  BILITOT 1.3* 1.3* 0.8 0.4  PROT 6.8 5.7* 5.5* 5.3*  ALBUMIN 2.9* 2.4* 2.3* 2.2*    No results for input(s): LIPASE, AMYLASE in the last 168 hours. No results for input(s): AMMONIA in the last 168 hours. Coagulation Profile: Recent Labs  Lab 10/05/20 2040  INR 1.1    Cardiac Enzymes: No results for input(s): CKTOTAL, CKMB, CKMBINDEX, TROPONINI in the last 168 hours. BNP (last 3 results) No results for input(s): PROBNP in the last 8760 hours. HbA1C: No results for input(s): HGBA1C in the last 72 hours. CBG: Recent Labs  Lab 10/07/20 1117 10/09/20 2032  GLUCAP 134* 146*    Lipid Profile: Recent Labs    10/10/20 0509  CHOL 133  HDL 22*  LDLCALC 94  TRIG 87  CHOLHDL 6.0    Thyroid Function Tests: No results for input(s): TSH, T4TOTAL, FREET4, T3FREE, THYROIDAB in the last 72 hours. Anemia Panel: No results for input(s): VITAMINB12, FOLATE, FERRITIN, TIBC, IRON, RETICCTPCT in the last 72 hours. Sepsis Labs: Recent Labs  Lab 10/05/20 2040 10/06/20 0046 10/06/20 0139 10/07/20 0443  PROCALCITON  --   --  7.93 5.34  LATICACIDVEN 1.7 1.0  --   --      Recent Results (from the past 240 hour(s))  Blood Culture (routine x 2)     Status: Abnormal   Collection Time: 10/05/20  8:17 PM   Specimen: Right Antecubital; Blood  Result Value Ref Range Status   Specimen Description   Final    RIGHT ANTECUBITAL Performed at [facility], [address] [place], [place] [address]    Special Requests   Final    BOTTLES DRAWN AEROBIC AND ANAEROBIC Blood Culture adequate volume Performed at [facility], [address] [place], [place] [address]    Culture  Setup Time   Final    GRAM NEGATIVE RODS AEROBIC BOTTLE ONLY CRITICAL RESULT CALLED TO, READ BACK BY AND VERIFIED WITH: E JACKSON,[email or web address]  10/06/21 [place] Performed at [facility], [place] [address], [place], [place] [address]    Culture PSEUDOMONAS AERUGINOSA (A)  Final   Report Status 10/09/2020 FINAL  Final   Organism ID, Bacteria PSEUDOMONAS AERUGINOSA  Final      Susceptibility   Pseudomonas aeruginosa - MIC*    CEFTAZIDIME 4 SENSITIVE Sensitive     CIPROFLOXACIN <=0.25 SENSITIVE Sensitive     GENTAMICIN <=1 SENSITIVE  Sensitive     IMIPENEM 2 SENSITIVE Sensitive     PIP/TAZO 8 SENSITIVE Sensitive     CEFEPIME 2 SENSITIVE Sensitive     * PSEUDOMONAS AERUGINOSA  Blood Culture ID Panel (Reflexed)     Status: Abnormal   Collection Time: 10/05/20  8:17 PM  Result Value Ref Range Status   Enterococcus faecalis NOT DETECTED NOT DETECTED Final   Enterococcus Faecium NOT DETECTED NOT DETECTED Final   Listeria monocytogenes NOT DETECTED NOT DETECTED Final   Staphylococcus species NOT DETECTED NOT DETECTED Final   Staphylococcus aureus (BCID) NOT DETECTED NOT DETECTED Final   Staphylococcus epidermidis NOT DETECTED NOT DETECTED Final   Staphylococcus lugdunensis NOT DETECTED NOT DETECTED Final   Streptococcus species NOT DETECTED NOT DETECTED Final   Streptococcus agalactiae NOT DETECTED NOT DETECTED Final   Streptococcus pneumoniae NOT DETECTED NOT DETECTED Final   Streptococcus pyogenes NOT DETECTED NOT DETECTED Final   A.calcoaceticus-baumannii NOT DETECTED NOT DETECTED Final   Bacteroides fragilis NOT DETECTED NOT DETECTED Final   Enterobacterales NOT DETECTED NOT DETECTED Final   Enterobacter cloacae complex NOT DETECTED NOT DETECTED Final   Escherichia coli  NOT DETECTED NOT DETECTED Final   Klebsiella aerogenes NOT DETECTED NOT DETECTED Final   Klebsiella oxytoca NOT DETECTED NOT DETECTED Final   Klebsiella pneumoniae NOT DETECTED NOT DETECTED Final   Proteus species NOT DETECTED NOT DETECTED Final   Salmonella species NOT DETECTED NOT DETECTED Final   Serratia marcescens NOT DETECTED NOT DETECTED Final   Haemophilus influenzae NOT DETECTED NOT DETECTED Final   Neisseria meningitidis NOT DETECTED NOT DETECTED Final   Pseudomonas aeruginosa DETECTED (A) NOT DETECTED Final    Comment: CRITICAL RESULT CALLED TO, READ BACK BY AND VERIFIED WITH: E JACKSON,[email or web address]  10/07/20 [place]    Stenotrophomonas maltophilia NOT DETECTED NOT DETECTED Final   Candida albicans NOT DETECTED NOT DETECTED Final   Candida auris NOT DETECTED NOT DETECTED Final   Candida glabrata NOT DETECTED NOT DETECTED Final   Candida krusei NOT DETECTED NOT DETECTED Final   Candida parapsilosis NOT DETECTED NOT DETECTED Final   Candida tropicalis NOT DETECTED NOT DETECTED Final   Cryptococcus neoformans/gattii NOT DETECTED NOT DETECTED Final   CTX-M ESBL NOT DETECTED NOT DETECTED Final   Carbapenem resistance IMP NOT DETECTED NOT DETECTED Final   Carbapenem resistance KPC NOT DETECTED NOT DETECTED Final   Carbapenem resistance NDM NOT DETECTED NOT DETECTED Final   Carbapenem resistance VIM NOT DETECTED NOT DETECTED Final    Comment: Performed at [facility], [address] [place], [place] [address]  Blood Culture (routine x 2)     Status: None   Collection Time: 10/05/20  8:22 PM   Specimen: BLOOD RIGHT HAND  Result Value Ref Range Status   Specimen Description   Final    BLOOD RIGHT HAND Performed at [facility], [address] [place], [place] [address]    Special Requests   Final    BOTTLES DRAWN AEROBIC AND ANAEROBIC Blood Culture results may not be optimal due to an inadequate volume of blood received in culture bottles Performed at [facility], [address] [place], [place] [address]    Culture   Final    NO GROWTH 5 DAYS Performed at [facility], [place] [address], [place], [place] [address]    Report Status 10/11/2020 FINAL  Final  Resp Panel by RT-PCR (Flu A&B, Covid) Nasopharyngeal Swab     Status:  None   Collection Time: 10/05/20  8:40 PM   Specimen: Nasopharyngeal Swab; Nasopharyngeal(NP) swabs in vial transport medium  Result Value Ref Range Status   SARS Coronavirus 2 by RT PCR NEGATIVE NEGATIVE Final    Comment: (NOTE) SARS-CoV-2 target nucleic acids are NOT DETECTED.  The SARS-CoV-2 RNA is generally detectable in upper respiratory specimens during the acute phase of infection. The lowest concentration of SARS-CoV-2 viral copies this assay can detect is 138 copies/mL. A negative result does not preclude SARS-Cov-2 infection and should not be used as the sole basis for treatment or other patient management decisions. A negative result may occur with  improper specimen collection/handling, submission of specimen other than nasopharyngeal swab, presence of viral mutation(s) within the areas targeted by this assay, and inadequate number of viral copies(<138 copies/mL). A negative result must be combined with clinical observations, patient history, and epidemiological information. The expected result is Negative.  Fact Sheet for Patients:  EntrepreneurPulse.com.au  Fact Sheet for Healthcare Providers:  IncredibleEmployment.be  This test is no t yet approved or cleared by the [place] FDA and  has been authorized for detection and/or diagnosis of SARS-CoV-2 by FDA under an Emergency Use Authorization (EUA). This EUA will remain  in effect (meaning this test can be used) for the duration of the COVID-19 declaration under Section 564(b)(1) of the Act, 21 U.S.C.section 360bbb-3(b)(1), unless the authorization is terminated  or revoked sooner.       Influenza  A by PCR NEGATIVE NEGATIVE Final   Influenza B by PCR NEGATIVE NEGATIVE Final    Comment: (NOTE) The Xpert Xpress SARS-CoV-2/FLU/RSV plus assay is intended as an aid in the diagnosis of influenza from Nasopharyngeal swab specimens and should not be used as a sole basis for treatment. Nasal washings and aspirates are unacceptable for Xpert Xpress SARS-CoV-2/FLU/RSV testing.  Fact Sheet for Patients: EntrepreneurPulse.com.au  Fact Sheet for Healthcare Providers: IncredibleEmployment.be  This test is not yet approved or cleared by the [place] FDA and has been authorized for detection and/or diagnosis of SARS-CoV-2 by FDA under an Emergency Use Authorization (EUA). This EUA will remain in effect (meaning this test can be used) for the duration of the COVID-19 declaration under Section 564(b)(1) of the Act, 21 U.S.C. section 360bbb-3(b)(1), unless the authorization is terminated or revoked.  Performed at [facility], [address] [place], [place] [address]   Urine Culture     Status: None   Collection Time: 10/05/20 10:15 PM   Specimen: In/Out Cath Urine  Result Value Ref Range Status   Specimen Description   Final    IN/OUT CATH URINE Performed at [facility], [address] [place], [place] [address]    Special Requests   Final    NONE Performed at [facility], [address] [place], [place] [address]    Culture   Final    NO GROWTH Performed at [facility], [place] [address], [place], [place] [address]    Report Status 10/07/2020 FINAL  Final     Radiology Studies: DG Chest Port 1 View  Result Date: 10/10/2020 CLINICAL DATA:  60 year old male with metastatic lung cancer. Cough. EXAM: PORTABLE CHEST 1 VIEW COMPARISON:  Chest CTA 10/06/2020 and earlier. FINDINGS: Portable AP semi upright view at 1002 hours. Confluent opacity persists throughout the right mid lung, with mixed cavitary and  consolidative changes on the recent CT thought mostly to reflect radiation pneumonitis. Stable lung  volumes and mediastinal contours. Visualized tracheal air column is within normal limits. No pneumothorax. No definite pleural effusion. Left lung remains stable. No acute osseous abnormality identified. IMPRESSION: Stable post treatment appearance of the right lung with presumed mostly radiation pneumonitis on recent CT. No new cardiopulmonary abnormality. Electronically Signed   By: Genevie Ann M.D.   On: 10/10/2020 10:35     LOS: 6 days   Antonieta Pert, MD [facility]  10/12/2020, 7:44 AM

## 2020-10-13 ENCOUNTER — Encounter (HOSPITAL_COMMUNITY): Payer: Self-pay | Admitting: Internal Medicine

## 2020-10-13 ENCOUNTER — Inpatient Hospital Stay (HOSPITAL_COMMUNITY): Payer: 59 | Admitting: Certified Registered Nurse Anesthetist

## 2020-10-13 ENCOUNTER — Other Ambulatory Visit: Payer: Self-pay

## 2020-10-13 ENCOUNTER — Encounter (HOSPITAL_COMMUNITY)
Admission: EM | Disposition: A | Discharge status: Home / Self Care | Payer: Self-pay | Source: Home / Self Care | Attending: Internal Medicine

## 2020-10-13 ENCOUNTER — Inpatient Hospital Stay (HOSPITAL_COMMUNITY)
Admit: 2020-10-13 | Discharge: 2020-10-13 | Disposition: A | Discharge status: Home / Self Care | Payer: 59 | Attending: Physician Assistant | Admitting: Physician Assistant

## 2020-10-13 DIAGNOSIS — R197 Diarrhea, unspecified: Secondary | ICD-10-CM

## 2020-10-13 DIAGNOSIS — I4819 Other persistent atrial fibrillation: Secondary | ICD-10-CM

## 2020-10-13 DIAGNOSIS — Q211 Atrial septal defect: Secondary | ICD-10-CM | POA: Diagnosis not present

## 2020-10-13 DIAGNOSIS — I4891 Unspecified atrial fibrillation: Secondary | ICD-10-CM

## 2020-10-13 DIAGNOSIS — R5081 Fever presenting with conditions classified elsewhere: Secondary | ICD-10-CM | POA: Diagnosis not present

## 2020-10-13 DIAGNOSIS — I480 Paroxysmal atrial fibrillation: Secondary | ICD-10-CM | POA: Diagnosis not present

## 2020-10-13 DIAGNOSIS — D709 Neutropenia, unspecified: Secondary | ICD-10-CM | POA: Diagnosis not present

## 2020-10-13 HISTORY — PX: TEE WITHOUT CARDIOVERSION: SHX5443

## 2020-10-13 HISTORY — PX: CARDIOVERSION: SHX1299

## 2020-10-13 HISTORY — PX: BUBBLE STUDY: SHX6837

## 2020-10-13 LAB — BASIC METABOLIC PANEL
Anion gap: 7 (ref 5–15)
BUN: 9 mg/dL (ref 6–20)
CO2: 29 mmol/L (ref 22–32)
Calcium: 8.4 mg/dL — ABNORMAL LOW (ref 8.9–10.3)
Chloride: 107 mmol/L (ref 98–111)
Creatinine, Ser: 0.83 mg/dL (ref 0.61–1.24)
GFR, Estimated: >60 mL/min (ref >60)
Glucose, Bld: 99 mg/dL (ref 70–99)
Potassium: 3.6 mmol/L (ref 3.5–5.1)
Sodium: 143 mmol/L (ref 135–145)

## 2020-10-13 LAB — CBC WITH DIFFERENTIAL/PLATELET
Abs Immature Granulocytes: 0 10*3/uL (ref 0.00–0.07)
Basophils Absolute: 0 10*3/uL (ref 0.0–0.1)
Basophils Relative: 1 %
Eosinophils Absolute: 0.1 10*3/uL (ref 0.0–0.5)
Eosinophils Relative: 6 %
HCT: 33 % — ABNORMAL LOW (ref 39.0–52.0)
Hemoglobin: 10.5 g/dL — ABNORMAL LOW (ref 13.0–17.0)
Immature Granulocytes: 0 %
Lymphocytes Relative: 51 %
Lymphs Abs: 0.8 10*3/uL (ref 0.7–4.0)
MCH: 29.1 pg (ref 26.0–34.0)
MCHC: 31.8 g/dL (ref 30.0–36.0)
MCV: 91.4 fL (ref 80.0–100.0)
Monocytes Absolute: 0.3 10*3/uL (ref 0.1–1.0)
Monocytes Relative: 18 %
Neutro Abs: 0.4 10*3/uL — CL (ref 1.7–7.7)
Neutrophils Relative %: 24 %
Platelets: 200 10*3/uL (ref 150–400)
RBC: 3.61 MIL/uL — ABNORMAL LOW (ref 4.22–5.81)
RDW: 15.9 % — ABNORMAL HIGH (ref 11.5–15.5)
WBC: 1.5 10*3/uL — ABNORMAL LOW (ref 4.0–10.5)
nRBC: 0 % (ref 0.0–0.2)

## 2020-10-13 SURGERY — ECHOCARDIOGRAM, TRANSESOPHAGEAL
Anesthesia: Monitor Anesthesia Care

## 2020-10-13 MED ORDER — PROPOFOL 500 MG/50ML IV EMUL
INTRAVENOUS | Status: DC | PRN
  Administered 2020-10-13: 125 ug/kg/min via INTRAVENOUS

## 2020-10-13 MED ORDER — PHENYLEPHRINE HCL (PRESSORS) 10 MG/ML IV SOLN
INTRAVENOUS | Status: DC | PRN
  Administered 2020-10-13 (×2): 80 ug via INTRAVENOUS

## 2020-10-13 MED ORDER — SODIUM CHLORIDE 0.9 % IV SOLN: INTRAVENOUS | Status: DC | PRN

## 2020-10-13 MED ORDER — PROPOFOL 10 MG/ML IV BOLUS
INTRAVENOUS | Status: DC | PRN
Start: 2020-10-13 — End: 2020-10-13
  Administered 2020-10-13: 20 mg via INTRAVENOUS
  Administered 2020-10-13: 40 mg via INTRAVENOUS
  Administered 2020-10-13: 20 mg via INTRAVENOUS

## 2020-10-13 MED ORDER — DEXMEDETOMIDINE (PRECEDEX) IN NS 20 MCG/5ML (4 MCG/ML) IV SYRINGE
PREFILLED_SYRINGE | INTRAVENOUS | Status: DC | PRN
  Administered 2020-10-13: 12 ug via INTRAVENOUS

## 2020-10-13 NOTE — Anesthesia Preprocedure Evaluation (Addendum)
Anesthesia Evaluation  Patient identified by MRN, date of birth, ID band Patient awake    Reviewed: Allergy & Precautions, NPO status , Patient's Chart, lab work & pertinent test results  History of Anesthesia Complications (+) Family history of anesthesia reaction and history of anesthetic complications (family member had seizure with anesthetic)  Airway Mallampati: II  TM Distance: >3 FB Neck ROM: Full    Dental  (+) Dental Advisory Given   Pulmonary asthma , COPD,  COPD inhaler, former smoker,   SCC lung    breath sounds clear to auscultation       Cardiovascular hypertension, Pt. on medications and Pt. on home beta blockers + dysrhythmias Atrial Fibrillation  Rhythm:Irregular Rate:Normal   '22 TTE - EF 60 to 65%. Trivial MR     Neuro/Psych Seizures -,  negative psych ROS   GI/Hepatic Neg liver ROS, GERD  Medicated,  Endo/Other  negative endocrine ROS  Renal/GU negative Renal ROS     Musculoskeletal  (+) Arthritis , Rheumatoid disorders,    Abdominal   Peds  Hematology  (+) anemia ,  Neutropenia    Anesthesia Other Findings   Reproductive/Obstetrics                            Anesthesia Physical Anesthesia Plan  ASA: 3  Anesthesia Plan: MAC and General   Post-op Pain Management:    Induction:   PONV Risk Score and Plan: 2 and Propofol infusion and Treatment may vary due to age or medical condition  Airway Management Planned: Nasal Cannula and Natural Airway  Additional Equipment: None  Intra-op Plan:   Post-operative Plan:   Informed Consent: I have reviewed the patients History and Physical, chart, labs and discussed the procedure including the risks, benefits and alternatives for the proposed anesthesia with the patient or authorized representative who has indicated his/her understanding and acceptance.     Dental advisory given  Plan Discussed with: CRNA,  Anesthesiologist and Surgeon  Anesthesia Plan Comments: (May begin procedure as MAC with conversion to GA as indicated by procedure )       Anesthesia Quick Evaluation

## 2020-10-13 NOTE — CV Procedure (Signed)
TEE/CARDIOVERSION NOTE  TRANSESOPHAGEAL ECHOCARDIOGRAM (TEE):  Indictation: Atrial Fibrillation  Consent:   Informed consent was obtained prior to the procedure. The risks, benefits and alternatives for the procedure were discussed and the patient comprehended these risks.  Risks include, but are not limited to, cough, sore throat, vomiting, nausea, somnolence, esophageal and stomach trauma or perforation, bleeding, low blood pressure, aspiration, pneumonia, infection, trauma to the teeth and death.    Time Out: Verified patient identification, verified procedure, site/side was marked, verified correct patient position, special equipment/implants available, medications/allergies/relevent history reviewed, required imaging and test results available. Performed  Procedure:  After a procedural time-out, the patient was given propofol per anesthesia for sedation. The patient's heart rate, blood pressure, and oxygen saturation are monitored continuously during the procedure. The oropharynx was anesthetized with topical cetacaine.  The transesophageal probe was inserted in the esophagus and stomach without difficulty and multiple views were obtained. Agitated microbubble saline contrast was administered.  Complications:    Complications: None Patient did tolerate procedure well.  Findings:  LEFT VENTRICLE: The left ventricular wall thickness is normal.  The left ventricular cavity is normal in size. Wall motion is normal.  LVEF is 60-65%.  RIGHT VENTRICLE:  The right ventricle is normal in structure and function without any thrombus or masses.    LEFT ATRIUM:  The left atrium is normal in size without any thrombus or masses.  There is not spontaneous echo contrast ("smoke") in the left atrium consistent with a low flow state.  LEFT ATRIAL APPENDAGE:  The small left atrial appendage is free of any thrombus or masses. The appendage has single lobes. Pulse doppler indicates low flow in the  appendage.  ATRIAL SEPTUM:  The atrial septum demonstrates PFO with predominantly left to right flow by color doppler, however, interatrial shunting with right to left flow was demonstrated by saline microbubble contrast.  RIGHT ATRIUM:  The right atrium is normal in size and function without any thrombus or masses.  MITRAL VALVE:  The mitral valve is normal in structure and function with  trivial  regurgitation.  There were no vegetations or stenosis.  AORTIC VALVE:  The aortic valve is trileaflet, normal in structure and function with  no  regurgitation.  There were no vegetations or stenosis  TRICUSPID VALVE:  The tricuspid valve is normal in structure and function with  trivial  regurgitation.  There were no vegetations or stenosis   PULMONIC VALVE:  The pulmonic valve is normal in structure and function with  trivial  regurgitation.  There were no vegetations or stenosis.   AORTIC ARCH, ASCENDING AND DESCENDING AORTA:  There was no Ron Parker et. Al, 1992) atherosclerosis of the ascending aorta, aortic arch, or proximal descending aorta.  12. PULMONARY VEINS: Anomalous pulmonary venous return was not noted.  13. PERICARDIUM: The pericardium appeared normal and non-thickened.  There is no pericardial effusion.  CARDIOVERSION:     Second Time Out: Verified patient identification, verified procedure, site/side was marked, verified correct patient position, special equipment/implants available, medications/allergies/relevent history reviewed, required imaging and test results available.  Performed  Procedure:  Patient placed on cardiac monitor, pulse oximetry, supplemental oxygen as necessary.  Sedation administered per anesthesia Pacer pads placed anterior and posterior chest. Cardioverted 2 time(s).  Cardioverted at 200J biphasic.  Complications:  Complications: None Patient did tolerate procedure well.  Impression:  No LAA thrombus Small PFO with bidirectional flow Trivial MR,  TR, PR LVEF 60-65%, normal wall motion Successful DCCV to NSR with  frequent PAC's and atrial couplets after 2 stacked shocks at 200J biphasic  Recommendations:   Continue IV amiodarone.  Time Spent Directly with the Patient:  45 minutes   Pixie Casino, MD, [facility], [place] Director of the [facility]  [facility] Diplomate of the American Board of Clinical Lipidology Attending Cardiologist  Direct Dial: [phone]  Fax: [phone]  Website:  [email or web address] 10/13/2020, 2:47 PM

## 2020-10-13 NOTE — Progress Notes (Addendum)
Progress Note  Patient Name: John [name] Date of Encounter: 10/13/2020  [facility] Cardiologist: Fransico Him, MD   Subjective   No chest pain , no SOB is lying flat.   Inpatient Medications    Scheduled Meds:  metoprolol tartrate  12.5 mg Oral Once   metoprolol tartrate  25 mg Oral BID   pantoprazole  40 mg Oral QHS   PARoxetine  10 mg Oral Daily   predniSONE  10 mg Oral Q breakfast   rivaroxaban  20 mg Oral Q supper   vancomycin  125 mg Oral BID   Continuous Infusions:  amiodarone 30 mg/hr (10/12/20 2120)   PRN Meds: acetaminophen **OR** acetaminophen, magic mouthwash, menthol-cetylpyridinium   Vital Signs    Vitals:   10/12/20 2000 10/13/20 0553 10/13/20 0600 10/13/20 0613  BP: 106/80 94/79 105/83   Pulse: (!) 135   (!) 104  Resp: 20     Temp: 97.6 F (36.4 C) 97.9 F (36.6 C)    TempSrc:  Oral    SpO2: 93%     Weight:      Height:        Intake/Output Summary (Last 24 hours) at 10/13/2020 0809 Last data filed at 10/13/2020 0300 Gross per 24 hour  Intake 1246.47 ml  Output 2475 ml  Net -1228.53 ml   Last 3 Weights 10/05/2020 09/30/2020 09/22/2020  Weight (lbs) 204 lb 208 lb 204 lb  Weight (kg) 92.534 kg 94.348 kg 92.534 kg      Telemetry    A fib with RVR up to 150 with talking activity otherwise 120s - Personally Reviewed  ECG    Yesterday a fib with slow VR.  - Personally Reviewed  Physical Exam   GEN: No acute distress.   Neck: No JVD Cardiac: irreg irreg, no murmurs, rubs, or gallops.  Respiratory: Clear to auscultation bilaterally. GI: Soft, nontender, non-distended  MS: No edema; No deformity. Neuro:  Nonfocal  Psych: Normal affect   Labs    High Sensitivity Troponin:  No results for input(s): TROPONINIHS in the last 720 hours.    Chemistry Recent Labs  Lab 10/07/20 0443 10/08/20 0508 10/09/20 0437 10/11/20 0542 10/11/20 1553 10/12/20 0525 10/13/20 0436  NA 138 138   < > 139  --  138 143  K 3.6 3.2*   < > 2.9* 3.5  3.7 3.6  CL 109 113*   < > 106  --  104 107  CO2 22 22   < > 26  --  30 29  GLUCOSE 153* 130*   < > 85  --  101* 99  BUN 17 12   < > 13  --  11 9  CREATININE 1.12 0.80   < > 0.81  --  0.86 0.83  CALCIUM 8.0* 7.8*   < > 7.9*  --  7.8* 8.4*  PROT 5.5* 5.3*  --   --   --   --   --   ALBUMIN 2.3* 2.2*  --   --   --   --   --   AST 19 18  --   --   --   --   --   ALT 21 22  --   --   --   --   --   ALKPHOS 46 46  --   --   --   --   --   BILITOT 0.8 0.4  --   --   --   --   --  GFRNONAA >60 >60   < > >60  --  >60 >60  ANIONGAP 7 3*   < > 7  --  4* 7   < > = values in this interval not displayed.     Hematology Recent Labs  Lab 10/11/20 0542 10/12/20 0525 10/13/20 0436  WBC 1.3* 1.2* 1.5*  RBC 3.26* 3.58* 3.61*  HGB 9.6* 10.4* 10.5*  HCT 29.5* 32.6* 33.0*  MCV 90.5 91.1 91.4  MCH 29.4 29.1 29.1  MCHC 32.5 31.9 31.8  RDW 16.0* 15.9* 15.9*  PLT 183 200 200    BNPNo results for input(s): BNP, PROBNP in the last 168 hours.   DDimer No results for input(s): DDIMER in the last 168 hours.   Radiology    No results found.  Cardiac Studies   2d echo 05/12/20  1. Left ventricular ejection fraction, by estimation, is 60 to 65%. The  left ventricle has normal function. The left ventricle has no regional  wall motion abnormalities. Left ventricular diastolic parameters are  indeterminate.   2. Right ventricular systolic function is normal. The right ventricular  size is normal. Tricuspid regurgitation signal is inadequate for assessing  PA pressure.   3. The mitral valve is normal in structure. Trivial mitral valve  regurgitation. No evidence of mitral stenosis.   4. The aortic valve is tricuspid. Aortic valve regurgitation is not  visualized. No aortic stenosis is present.   5. The inferior vena cava is normal in size with greater than 50%  respiratory variability, suggesting right atrial pressure of 3 mmHg.   6. The patient is in atrial fibrillation.   Patient Profile      60 y.o. 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 Dx with PAF in the setting of acute illness 04/2020, on amiodarone until stopped 07/2020 (CT scan changes at that time). This admission was admitted with fever and vomiting. He was found to have pseudomonal bacteremia, neuropenic fever, hypomagnesemia, hypokalemia, hyponatremia, and hypotension. HIV antibody was reactive but subsequent confirmatory testing unrevealing. He went into atrial fib RVR shortly after breathing treatment (duoneb).   Assessment & Plan   1. Neutropenic fever with pseudomonal bacteremia/sepsis - multiple issues on admission including abnormal CBC, electrolyte abnormalities, hypoalbuminemia, clinically improved - CT findings as noted   2. Paroxysmal atrial fib RVR  - patient feels incited by albuterol rx, feeling better with Xopenex - also has known pre-disposition to atrial fib as noted before and also in the context of numerous physiologic stressors so challenging to know for certain, but added albuterol to list of meds to avoid in the future - CT also shows evidence of splenic infarcts which potentially raises CHADSVASC to 3 (atherosclerosis on CT, infarct event) - [name] has recommended initiation of anticoagulation due to thromboembolic risk with [id number] score 3 (Splenic infarct and coronary Ca on CT). - amiodarone previously d/c'd 07/2020 in setting of NSR / CT changes (?pulmonary toxicity) although on CT this admission those changes are favored to be post-radiation  -Discussed with Pulmonary and ok'd to restart Amio >> was on Amio 200mg  BID for HR control but due to persistently elevated HRs this was changed to IV Amio yesterday - still having uncontrolled rates on tele tachy brady at times to 150 - baseline QTC on sinus EKGs is >470ms so does not seem  dofetilide or Sotolol is an option - Made him NPO after  MN for TEE/DCCV today  - continue Lopressor 25mg  BID and IV Amio for rate control - continue Xarelto 20mg  daily -Shared Decision Making/Informed Consent see note of 10/12/20 for TEE/DCCV    3. 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  Agree with above documentation.  On exam, patient is alert and oriented, tachycardic, irregular,no murmurs, lungs CTAB, no LE edema.  No chest pain or dyspnea.  Telemetry shows Afib, currently with rates 90-110s.  Planning TEE/DCCV today.    Donato Heinz, MD

## 2020-10-13 NOTE — Anesthesia Postprocedure Evaluation (Signed)
Anesthesia Post Note  Patient: John [name]  Procedure(s) Performed: TRANSESOPHAGEAL ECHOCARDIOGRAM (TEE) CARDIOVERSION BUBBLE STUDY     Patient location during evaluation: Endoscopy Anesthesia Type: General Level of consciousness: awake and alert, patient cooperative and oriented Pain management: pain level controlled Respiratory status: spontaneous breathing, nonlabored ventilation and respiratory function stable Postop Assessment: no apparent nausea or vomiting and adequate PO intake Anesthetic complications: no   No notable events documented.  Last Vitals:  Vitals:   10/13/20 1520 10/13/20 1530  BP: 113/74 108/70  Pulse: 82 78  Resp: 17 (!) 21  Temp:    SpO2: 92% 92%    Last Pain:  Vitals:   10/13/20 1530  TempSrc:   PainSc: 0-No pain                  Shellhammer,E. Brandice Busser

## 2020-10-13 NOTE — Progress Notes (Signed)
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 fib not on anticoagulation due to low score, GERD, history of CAD, on Toprol admitted with With neutropenic fever, sepsis with SIRS criteria   In the ED febrile 100.1, hyponatremic neutropenic.  Placed on vancomycin/cefepime. Chest x-ray nonacute de-escalation could be radiation pneumonitis versus superimposed infection, normal lactic acid, UA no evidence of UTI, COVID-19 negative. CT chest: Increasingly conspicuous lymph nodes in the right hilar, mediastinal, lower right cervical and supraclavicular nodal stations concerning for potential metastatic disease, although some of this could be simply reactive . Satellite nodule measuring 11 mm in the right upper lobe, stable compared to the prior study.Right adrenal metastasis. 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 Granix ordered but patient declined given previous intolerance. WBC count recently improving.  Patient found to have Pseudomonas sepsis-based on the sensitivity patient can go on Cipro through 8/28 and oral vancomycin until 9/4 for secondary C Diff prophylaxis. Patient had A. fib with RVR, seen by cardiology, placed on amiodarone 8/26 after pulmonary clearance. PO amio changed to IV 8/27  Subjective:  Seen and examined this morning.  Denies chest pain shortness of breath.  Able to lay flat.  No fever.  Assessment & Plan:  Neutropenic fever Pseudomonas sepsis POA Immunosuppressed on immunotherapy  and on long-term steroid for RA: No clear source for bacteremia.  Completed Cipro 8/28 confirmed with ID today.  Cont vancomycin until 9/4 for secondary C Diff prophylaxis 2/2 hx of C DIFF in march.  Neutropenia with anemia, thrombocytopenia and neutropenia:Not due to immunotherapy per oncology, ?felty's syndrome.He has predated pancytopenia prior to his immunotherapy likely chronic component.  Drop could be 2/2  his sepsis.  Continue to monitor his fever curve and WBC count. he declined granix due to previous intolerance. Recent Labs  Lab 10/09/20 0437 10/10/20 0509 10/11/20 0542 10/12/20 0525 10/13/20 0436  WBC 1.6* 1.8* 1.3* 1.2* 1.5*   PAF with RVR-Appreciate cardiology input, continue Amiodarone iv ( after pulm clearance)- BP too soft to increase beta-blocker,on metoprolol 25 mg bid.  Possible TEE/DCCV. He is on Xarelto continue per cardiology- CHADS2VASC2 score is 3 as per [place] per cardiology.    COPD with asthma  Radiation pneumonitis Wheezing Having bouts of cough triggering A. fib shortness of breath few nights ago.  Continue po prednisone, xopenex,avoid albuterol.  Repeat chest x-ray  8/26-stable posttreatment appearance of right lung with presumed mostly radiation pneumonitis. PCCM cleared for amio use.  Cavitary right lung mass: Pulmonary consulted could be source of bacteremia. SCC stage IV lung SCC with adrenal metastasis on immunotherapy-Dr Ennever ollowing.  CT chest shows lymph nodes in the right hilar mediastinal nor right cervical and supraclavicular node-?Potentially metastatic disease versus reactive lymph nodes.   Hypokalemia: Monitor, was repeat replete to keep >4  Hypomagnesemia: Chest  Hypotension- BP stable.  At times soft high 90s.  Hyponatremia:suspect volume depletion. Resolved w ivf.   RA on long-term prednisone-5 mg at home s/p stress dose, cont po prednisone 10 mg 8/26.  De-escalate to home dose in  few days  HIV Ab 1/2 AB: Came back negative.  On admission screening test positive but HIV 1/2 AB is negative.   Diet Order             Diet NPO time specified  Diet effective midnight                 Patient's Body mass index is 29.27 kg/m. DVT prophylaxis: XARELTO Code Status:   Code Status: Full Code  Family Communication: plan of care discussed with patient at bedside. Status is: Inpatient Remains inpatient appropriate because:Inpatient level of care appropriate due to severity of illness Dispo: The patient is from: Home              Anticipated d/c is to: Home              Patient currently is not medically stable to d/c.   Difficult to place patient No  Unresulted Labs (From admission, onward)     Start     Ordered   10/12/20 6195  Basic metabolic panel  Daily,   R     Question:  Specimen collection method  Answer:  Lab=Lab collect   10/11/20 1645   10/09/20 0500  CBC with Differential/Platelet  Daily,   R      10/08/20 0945   Signed and Held  Protime-INR  Once,   STAT       Question:  Specimen collection method  Answer:  Lab=Lab collect   Signed and Held            Medications reviewed: Scheduled Meds:  metoprolol tartrate  12.5 mg Oral Once   metoprolol tartrate  25 mg Oral BID   pantoprazole  40 mg Oral QHS   PARoxetine  10 mg Oral Daily   predniSONE  10 mg Oral Q breakfast   rivaroxaban  20 mg Oral Q supper   vancomycin  125 mg Oral BID   Continuous Infusions:  amiodarone 30 mg/hr (10/13/20 0942)   Consultants:see note  Procedures:see note Antimicrobials: Anti-infectives (From admission, onward)    Start     Dose/Rate Route Frequency Ordered Stop   10/11/20 0930  ciprofloxacin (CIPRO) tablet 750 mg        750 mg Oral 2 times daily 10/11/20 0831 10/12/20 2117   10/09/20 2200  vancomycin (VANCOCIN) capsule 125 mg        125 mg Oral 2 times daily 10/09/20 1334 10/19/20 2359   10/09/20 1400  ciprofloxacin (CIPRO) tablet 750 mg  Status:  Discontinued        750 mg Oral 2 times daily 10/09/20  0903 10/11/20 0831   10/07/20 2200  vancomycin (VANCOCIN) capsule 125 mg  Status:  Discontinued        125 mg Oral 2 times daily 10/07/20 1718 10/09/20 0719   10/06/20 2000  vancomycin (VANCOREADY) IVPB 1500 mg/300 mL  Status:  Discontinued        1,500 mg 150 mL/hr over 120 Minutes Intravenous Every 24 hours 10/05/20 2149 10/06/20 1046   10/06/20 1200  vancomycin (VANCOREADY) IVPB 1250 mg/250 mL  Status:  Discontinued        1,250 mg 166.7 mL/hr over 90 Minutes Intravenous Every 12 hours 10/06/20 1046 10/07/20 1427   10/06/20 0600  ceFEPIme (MAXIPIME) 2 g in sodium chloride 0.9 % 100 mL IVPB  Status:  Discontinued        2 g 200 mL/hr over 30 Minutes Intravenous Every 8 hours 10/05/20 2149  10/09/20 0903   10/05/20 2030  ceFEPIme (MAXIPIME) 2 g in sodium chloride 0.9 % 100 mL IVPB        2 g 200 mL/hr over 30 Minutes Intravenous  Once 10/05/20 2017 10/05/20 2216   10/05/20 2030  vancomycin (VANCOCIN) IVPB 1000 mg/200 mL premix  Status:  Discontinued        1,000 mg 200 mL/hr over 60 Minutes Intravenous  Once 10/05/20 2017 10/05/20 2029   10/05/20 2030  vancomycin (VANCOCIN) IVPB 1000 mg/200 mL premix        1,000 mg 200 mL/hr over 60 Minutes Intravenous Every hour 10/05/20 2029 10/05/20 2319      Culture/Microbiology    Component Value Date/Time   SDES  10/05/2020 2215    IN/OUT CATH URINE Performed at [facility], [address] [place], [place] [address]    [facility]  10/05/2020 2215    NONE Performed at [facility], [address], [place], [place] [address]    CULT  10/05/2020 2215    NO GROWTH Performed at [facility], [place] [address] [place],  [address]    REPTSTATUS 10/07/2020 FINAL 10/05/2020 2215    Other culture-see note  Objective: Vitals: Today's Vitals   10/12/20 2000 10/13/20 0553 10/13/20 0600 10/13/20 0613  BP: 106/80 94/79 105/83   Pulse: (!) 135   (!) 104  Resp: 20     Temp: 97.6 F (36.4 C) 97.9 F (36.6  C)    TempSrc:  Oral    SpO2: 93%     Weight:      Height:      PainSc: 0-No pain       Intake/Output Summary (Last 24 hours) at 10/13/2020 1041 Last data filed at 10/13/2020 0939 Gross per 24 hour  Intake 746.47 ml  Output 2700 ml  Net -1953.53 ml    Filed Weights   10/05/20 1956  Weight: 92.5 kg   Weight change:   Intake/Output from previous day: 08/28 0701 - 08/29 0700 In: 1246.5 [P.O.:1100; I.V.:146.5] Out: 2475 [Urine:2475] Intake/Output this shift: Total I/O In: -  Out: 225 [Urine:225] Filed Weights   10/05/20 1956  Weight: 92.5 kg   Examination: General exam: AAOx3, pleasant, not in distress.  HEENT:Oral mucosa moist, Ear/Nose WNL grossly, dentition normal. Respiratory system: bilaterally diminished, clear no wheezing no use of accessory muscle Cardiovascular system: S1 & S2 +, irregularly irregular, no JVD,. Gastrointestinal system: Abdomen soft, NT,ND, BS+ Nervous System:Alert, awake, moving extremities and grossly nonfocal Extremities: no edema, distal peripheral pulses palpable.  Skin: No rashes,no icterus. MSK: Normal muscle bulk,tone, power   Data Reviewed: I have personally reviewed following labs and imaging studies CBC: Recent Labs  Lab 10/09/20 0437 10/10/20 0509 10/11/20 0542 10/12/20 0525 10/13/20 0436  WBC 1.6* 1.8* 1.3* 1.2* 1.5*  NEUTROABS 0.7* 0.6* 0.3* 0.3* 0.4*  HGB 9.9* 11.1* 9.6* 10.4* 10.5*  HCT 30.3* 34.0* 29.5* 32.6* 33.0*  MCV 90.7 90.4 90.5 91.1 91.4  PLT 197 218 183 200 270    Basic Metabolic Panel: Recent Labs  Lab 10/07/20 0443 10/08/20 0508 10/09/20 0437 10/11/20 0542 10/11/20 1553 10/12/20 0525 10/13/20 0436  NA 138 138 140 139  --  138 143  K 3.6 3.2* 3.5 2.9* 3.5 3.7 3.6  CL 109 113* 112* 106  --  104 107  CO2 22 22 23 26   --  30 29  GLUCOSE 153* 130* 138* 85  --  101* 99  BUN 17 12 11 13   --  11 9  CREATININE 1.12 0.80 0.93 0.81  --  0.86 0.83  CALCIUM 8.0* 7.8* 8.1* 7.9*  --  7.8* 8.4*  MG 2.1  --    --  1.8  --  2.0  --     GFR: Estimated Creatinine Clearance: 109.5 mL/min (by C-G formula based on SCr of 0.83 mg/dL). Liver Function Tests: Recent Labs  Lab 10/07/20 0443 10/08/20 0508  AST 19 18  ALT 21 22  ALKPHOS 46 46  BILITOT 0.8 0.4  PROT 5.5* 5.3*  ALBUMIN 2.3* 2.2*    No results for input(s): LIPASE, AMYLASE in the last 168 hours. No results for input(s): AMMONIA in the last 168 hours. Coagulation Profile: No results for input(s): INR, PROTIME in the last 168 hours.  Cardiac Enzymes: No results for input(s): CKTOTAL, CKMB, CKMBINDEX, TROPONINI in the last 168 hours. BNP (last 3 results) No results for input(s): PROBNP in the last 8760 hours. HbA1C: No results for input(s): HGBA1C in the last 72 hours. CBG: Recent Labs  Lab 10/07/20 1117 10/09/20 2032  GLUCAP 134* 146*    Lipid Profile: No results for input(s): CHOL, HDL, LDLCALC, TRIG, CHOLHDL, LDLDIRECT in the last 72 hours.  Thyroid Function Tests: No results for input(s): TSH, T4TOTAL, FREET4, T3FREE, THYROIDAB in the last 72 hours. Anemia Panel: No results for input(s): VITAMINB12, FOLATE, FERRITIN, TIBC, IRON, RETICCTPCT in the last 72 hours. Sepsis Labs: Recent Labs  Lab 10/07/20 0443  PROCALCITON 5.34     Recent Results (from the past 240 hour(s))  Blood Culture (routine x 2)     Status: Abnormal   Collection Time: 10/05/20  8:17 PM   Specimen: Right Antecubital; Blood  Result Value Ref Range Status   Specimen Description   Final    RIGHT ANTECUBITAL Performed at [facility], [address] [place], [place] [address]    Special Requests   Final    BOTTLES DRAWN AEROBIC AND ANAEROBIC Blood Culture adequate volume Performed at [facility], [address] [place], [place] [address]    Culture  Setup Time   Final    GRAM NEGATIVE RODS AEROBIC BOTTLE ONLY CRITICAL RESULT CALLED TO, READ BACK BY AND VERIFIED WITH: E JACKSON,[id number]  10/06/21 [place] Performed at  [facility], [place] [address], [place], [place] [address]    Culture PSEUDOMONAS AERUGINOSA (A)  Final   Report Status 10/09/2020 FINAL  Final   Organism ID, Bacteria PSEUDOMONAS AERUGINOSA  Final      Susceptibility   Pseudomonas aeruginosa - MIC*    CEFTAZIDIME 4 SENSITIVE Sensitive     CIPROFLOXACIN <=0.25 SENSITIVE Sensitive     GENTAMICIN <=1 SENSITIVE Sensitive     IMIPENEM 2 SENSITIVE Sensitive     PIP/TAZO 8 SENSITIVE Sensitive     CEFEPIME 2 SENSITIVE Sensitive     * PSEUDOMONAS AERUGINOSA  Blood Culture ID Panel (Reflexed)     Status: Abnormal   Collection Time: 10/05/20  8:17 PM  Result Value Ref Range Status   Enterococcus faecalis NOT DETECTED NOT DETECTED Final   Enterococcus Faecium NOT DETECTED NOT DETECTED Final   Listeria monocytogenes NOT DETECTED NOT DETECTED Final   Staphylococcus species NOT DETECTED NOT DETECTED Final   Staphylococcus aureus (BCID) NOT DETECTED NOT DETECTED Final   Staphylococcus epidermidis NOT DETECTED NOT DETECTED Final   Staphylococcus lugdunensis NOT DETECTED NOT DETECTED Final   Streptococcus species NOT DETECTED NOT DETECTED  Final   Streptococcus agalactiae NOT DETECTED NOT DETECTED Final   Streptococcus pneumoniae NOT DETECTED NOT DETECTED Final   Streptococcus pyogenes NOT DETECTED NOT DETECTED Final   A.calcoaceticus-baumannii NOT DETECTED NOT DETECTED Final   Bacteroides fragilis NOT DETECTED NOT DETECTED Final   Enterobacterales NOT DETECTED NOT DETECTED Final   Enterobacter cloacae complex NOT DETECTED NOT DETECTED Final   Escherichia coli NOT DETECTED NOT DETECTED Final   Klebsiella aerogenes NOT DETECTED NOT DETECTED Final   Klebsiella oxytoca NOT DETECTED NOT DETECTED Final   Klebsiella pneumoniae NOT DETECTED NOT DETECTED Final   Proteus species NOT DETECTED NOT DETECTED Final   Salmonella species NOT DETECTED NOT DETECTED Final   Serratia marcescens NOT DETECTED NOT DETECTED Final   Haemophilus influenzae NOT  DETECTED NOT DETECTED Final   Neisseria meningitidis NOT DETECTED NOT DETECTED Final   Pseudomonas aeruginosa DETECTED (A) NOT DETECTED Final    Comment: CRITICAL RESULT CALLED TO, READ BACK BY AND VERIFIED WITH: E JACKSON,[id number]  10/07/20 [place]    Stenotrophomonas maltophilia NOT DETECTED NOT DETECTED Final   Candida albicans NOT DETECTED NOT DETECTED Final   Candida auris NOT DETECTED NOT DETECTED Final   Candida glabrata NOT DETECTED NOT DETECTED Final   Candida krusei NOT DETECTED NOT DETECTED Final   Candida parapsilosis NOT DETECTED NOT DETECTED Final   Candida tropicalis NOT DETECTED NOT DETECTED Final   Cryptococcus neoformans/gattii NOT DETECTED NOT DETECTED Final   CTX-M ESBL NOT DETECTED NOT DETECTED Final   Carbapenem resistance IMP NOT DETECTED NOT DETECTED Final   Carbapenem resistance KPC NOT DETECTED NOT DETECTED Final   Carbapenem resistance NDM NOT DETECTED NOT DETECTED Final   Carbapenem resistance VIM NOT DETECTED NOT DETECTED Final    Comment: Performed at [facility], [address] [place], [place] [address]  Blood Culture (routine x 2)     Status: None   Collection Time: 10/05/20  8:22 PM   Specimen: BLOOD RIGHT HAND  Result Value Ref Range Status   Specimen Description   Final    BLOOD RIGHT HAND Performed at [facility], [address] [place], [place] [address]    Special Requests   Final    BOTTLES DRAWN AEROBIC AND ANAEROBIC Blood Culture results may not be optimal due to an inadequate volume of blood received in culture bottles Performed at [facility], [address] [place], [place] [address]    Culture   Final    NO GROWTH 5 DAYS Performed at [facility], [place] [address] [place], [place] [address]    Report Status 10/11/2020 FINAL  Final  Resp Panel by RT-PCR (Flu A&B, Covid) Nasopharyngeal Swab     Status: None   Collection Time: 10/05/20  8:40 PM   Specimen: Nasopharyngeal Swab; Nasopharyngeal(NP) swabs in  vial transport medium  Result Value Ref Range Status   SARS Coronavirus 2 by RT PCR NEGATIVE NEGATIVE Final    Comment: (NOTE) SARS-CoV-2 target nucleic acids are NOT DETECTED.  The SARS-CoV-2 RNA is generally detectable in upper respiratory specimens during the acute phase of infection. The lowest concentration of SARS-CoV-2 viral copies this assay can detect is 138 copies/mL. A negative result does not preclude SARS-Cov-2 infection and should not be used as the sole basis for treatment or other patient management decisions. A negative result may occur with  improper specimen collection/handling, submission of specimen other than nasopharyngeal swab, presence of viral mutation(s) within the areas targeted by  this assay, and inadequate number of viral copies(<138 copies/mL). A negative result must be combined with clinical observations, patient history, and epidemiological information. The expected result is Negative.  Fact Sheet for Patients:  EntrepreneurPulse.com.au  Fact Sheet for Healthcare Providers:  IncredibleEmployment.be  This test is no t yet approved or cleared by the [place] FDA and  has been authorized for detection and/or diagnosis of SARS-CoV-2 by FDA under an Emergency Use Authorization (EUA). This EUA will remain  in effect (meaning this test can be used) for the duration of the COVID-19 declaration under Section 564(b)(1) of the Act, 21 U.S.C.section 360bbb-3(b)(1), unless the authorization is terminated  or revoked sooner.       Influenza A by PCR NEGATIVE NEGATIVE Final   Influenza B by PCR NEGATIVE NEGATIVE Final    Comment: (NOTE) The Xpert Xpress SARS-CoV-2/FLU/RSV plus assay is intended as an aid in the diagnosis of influenza from Nasopharyngeal swab specimens and should not be used as a sole basis for treatment. Nasal washings and aspirates are unacceptable for Xpert Xpress SARS-CoV-2/FLU/RSV testing.  Fact  Sheet for Patients: EntrepreneurPulse.com.au  Fact Sheet for Healthcare Providers: IncredibleEmployment.be  This test is not yet approved or cleared by the [place] FDA and has been authorized for detection and/or diagnosis of SARS-CoV-2 by FDA under an Emergency Use Authorization (EUA). This EUA will remain in effect (meaning this test can be used) for the duration of the COVID-19 declaration under Section 564(b)(1) of the Act, 21 U.S.C. section 360bbb-3(b)(1), unless the authorization is terminated or revoked.  Performed at [facility], [address], [place], [place] [address]   Urine Culture     Status: None   Collection Time: 10/05/20 10:15 PM   Specimen: In/Out Cath Urine  Result Value Ref Range Status   Specimen Description   Final    IN/OUT CATH URINE Performed at [facility], [address] [place], [place] [address]    Special Requests   Final    NONE Performed at [facility], [address] [place], [place] [address]    Culture   Final    NO GROWTH Performed at [facility], [place] [address] [place], [place] [address]    Report Status 10/07/2020 FINAL  Final     Radiology Studies: No results found.   LOS: 7 days   Antonieta Pert, MD [facility]  10/13/2020, 10:41 AM

## 2020-10-13 NOTE — Transfer of Care (Signed)
Immediate Anesthesia Transfer of Care Note  Patient: John [name]  Procedure(s) Performed: TRANSESOPHAGEAL ECHOCARDIOGRAM (TEE) CARDIOVERSION BUBBLE STUDY  Patient Location: PACU  Anesthesia Type:General  Level of Consciousness: awake, alert  and oriented  Airway & Oxygen Therapy: Patient Spontanous Breathing and Patient connected to nasal cannula oxygen  Post-op Assessment: Report given to RN and Post -op Vital signs reviewed and stable  Post vital signs: Reviewed and stable  Last Vitals:  Vitals Value Taken Time  BP 109/63 10/13/20 1503  Temp 36.8 C 10/13/20 1502  Pulse 90 10/13/20 1507  Resp 19 10/13/20 1507  SpO2 94 % 10/13/20 1507  Vitals shown include unvalidated device data.  Last Pain:  Vitals:   10/13/20 1502  TempSrc: Axillary  PainSc: 0-No pain         Complications: No notable events documented.

## 2020-10-13 NOTE — Progress Notes (Signed)
John [name] is on an amiodarone drip right now.  Cardiology is working hard with him to try to get him back into sinus rhythm.  It sounds like there may be the possibility for cardioversion.  He is worried about having "heart surgery".  I told him I would not imagine that that would ever be a consideration for him.  His white cell count trends up and down.  It was dropping over the weekend.  Today it is 1.5.  I think he will always have a low white cell count because of the splenomegaly.  I think his antibiotics should be almost done.  He had the Pseudomonas in the blood.  Infectious Disease has been following this.  He said he had a little bit of diarrhea over the weekend.  His electrolytes look okay.  Potassium 3.6.  I know that there is been a temperature spike.  This morning, his temperature is 97.9.  Pulse 104.  Blood pressure 105/83.  His lungs sound clear bilaterally.  Cardiac exam is irregular rate and irregular rhythm, consistent with the atrial fibrillation.  Abdomen is soft.  Bowel sounds are present.  There is no fluid wave.  There is no palpable liver or spleen tip.  Extremity shows no clubbing, cyanosis or edema.  Neurological exam shows no focal neurological deficits.  Skin exam shows no rashes.  He is on Xarelto.  He is also on low-dose prednisone.  This is for the rheumatoid arthritis.  I think that what ever cardiology wishes to do for the atrial fibrillation, I would have no problems with.  It would not affect his underlying lung cancer.  It would not affect the immunotherapy that we use.  I know this is very complicated.  I do appreciate everybody's help.  I know everybody is doing a wonderful job trying to get him better so he can go home.  [name], MD  Psalm 34:4

## 2020-10-13 NOTE — Progress Notes (Signed)
  Echocardiogram Echocardiogram Transesophageal has been performed.  John [name] 10/13/2020, 3:08 PM

## 2020-10-14 ENCOUNTER — Encounter (HOSPITAL_COMMUNITY): Payer: Self-pay | Admitting: Internal Medicine

## 2020-10-14 DIAGNOSIS — D72819 Decreased white blood cell count, unspecified: Secondary | ICD-10-CM

## 2020-10-14 DIAGNOSIS — I480 Paroxysmal atrial fibrillation: Secondary | ICD-10-CM | POA: Diagnosis not present

## 2020-10-14 LAB — BASIC METABOLIC PANEL
Anion gap: 4 — ABNORMAL LOW (ref 5–15)
BUN: 8 mg/dL (ref 6–20)
CO2: 31 mmol/L (ref 22–32)
Calcium: 8.2 mg/dL — ABNORMAL LOW (ref 8.9–10.3)
Chloride: 104 mmol/L (ref 98–111)
Creatinine, Ser: 0.82 mg/dL (ref 0.61–1.24)
GFR, Estimated: >60 mL/min (ref >60)
Glucose, Bld: 89 mg/dL (ref 70–99)
Potassium: 3.8 mmol/L (ref 3.5–5.1)
Sodium: 139 mmol/L (ref 135–145)

## 2020-10-14 LAB — CBC WITH DIFFERENTIAL/PLATELET
Abs Immature Granulocytes: 0.01 10*3/uL (ref 0.00–0.07)
Basophils Absolute: 0 10*3/uL (ref 0.0–0.1)
Basophils Relative: 1 %
Eosinophils Absolute: 0.1 10*3/uL (ref 0.0–0.5)
Eosinophils Relative: 9 %
HCT: 30.9 % — ABNORMAL LOW (ref 39.0–52.0)
Hemoglobin: 10.3 g/dL — ABNORMAL LOW (ref 13.0–17.0)
Immature Granulocytes: 1 %
Lymphocytes Relative: 44 %
Lymphs Abs: 0.6 10*3/uL — ABNORMAL LOW (ref 0.7–4.0)
MCH: 29.3 pg (ref 26.0–34.0)
MCHC: 33.3 g/dL (ref 30.0–36.0)
MCV: 87.8 fL (ref 80.0–100.0)
Monocytes Absolute: 0.3 10*3/uL (ref 0.1–1.0)
Monocytes Relative: 19 %
Neutro Abs: 0.4 10*3/uL — CL (ref 1.7–7.7)
Neutrophils Relative %: 26 %
Platelets: 179 10*3/uL (ref 150–400)
RBC: 3.52 MIL/uL — ABNORMAL LOW (ref 4.22–5.81)
RDW: 15.7 % — ABNORMAL HIGH (ref 11.5–15.5)
WBC: 1.4 10*3/uL — CL (ref 4.0–10.5)
nRBC: 0 % (ref 0.0–0.2)

## 2020-10-14 MED ORDER — VANCOMYCIN HCL 125 MG PO CAPS: 125.0000 mg | ORAL_CAPSULE | Freq: Two times a day (BID) | ORAL | 0 refills | Status: AC

## 2020-10-14 MED ORDER — METOPROLOL TARTRATE 25 MG PO TABS: 25.0000 mg | ORAL_TABLET | Freq: Two times a day (BID) | ORAL | 0 refills | Status: DC

## 2020-10-14 MED ORDER — RIVAROXABAN 20 MG PO TABS
20.0000 mg | ORAL_TABLET | Freq: Every day | ORAL | 0 refills | Status: DC
Start: 2020-10-14 — End: 2020-11-13

## 2020-10-14 MED ORDER — AMIODARONE HCL 200 MG PO TABS
200.0000 mg | ORAL_TABLET | Freq: Two times a day (BID) | ORAL | Status: DC
  Administered 2020-10-14: 200 mg via ORAL
  Filled 2020-10-14: qty 1

## 2020-10-14 MED ORDER — AMIODARONE HCL 200 MG PO TABS
200.0000 mg | ORAL_TABLET | Freq: Two times a day (BID) | ORAL | 0 refills | Status: DC
Start: 2020-10-14 — End: 2020-10-28

## 2020-10-14 MED ORDER — AMIODARONE HCL 200 MG PO TABS: 200.0000 mg | ORAL_TABLET | Freq: Every day | ORAL | 0 refills | Status: DC

## 2020-10-14 MED ORDER — AMIODARONE HCL 200 MG PO TABS: 200.0000 mg | ORAL_TABLET | Freq: Every day | ORAL | Status: DC

## 2020-10-14 NOTE — Progress Notes (Addendum)
Progress Note  Patient Name: John [name] Date of Encounter: 10/14/2020  [facility] Cardiologist: Fransico Him, MD  Subjective   No chest pain, no SOB wanting to go home.    Inpatient Medications    Scheduled Meds:  amiodarone  200 mg Oral BID   Followed by   Derrill Memo ON 10/28/2020] amiodarone  200 mg Oral Daily   metoprolol tartrate  12.5 mg Oral Once   metoprolol tartrate  25 mg Oral BID   pantoprazole  40 mg Oral QHS   PARoxetine  10 mg Oral Daily   predniSONE  10 mg Oral Q breakfast   rivaroxaban  20 mg Oral Q supper   vancomycin  125 mg Oral BID   Continuous Infusions:  PRN Meds: acetaminophen **OR** acetaminophen, magic mouthwash, menthol-cetylpyridinium   Vital Signs    Vitals:   10/13/20 1952 10/14/20 0000 10/14/20 0400 10/14/20 0846  BP: 116/76 93/68 111/73 113/75  Pulse:  69 69 91  Resp: 20 18 20    Temp: 98.6 F (37 C)  98.3 F (36.8 C)   TempSrc: Oral  Oral   SpO2: 95% 97% 92%   Weight:      Height:        Intake/Output Summary (Last 24 hours) at 10/14/2020 1122 Last data filed at 10/14/2020 1000 Gross per 24 hour  Intake 790.1 ml  Output 3400 ml  Net -2609.9 ml   Last 3 Weights 10/05/2020 09/30/2020 09/22/2020  Weight (lbs) 204 lb 208 lb 204 lb  Weight (kg) 92.534 kg 94.348 kg 92.534 kg      Telemetry    SR - Personally Reviewed  ECG    SR LAD - Personally Reviewed  Physical Exam   GEN: No acute distress.   Neck: No JVD Cardiac: RRR, no murmurs, rubs, or gallops.  Respiratory: Clear to auscultation bilaterally. GI: Soft, nontender, non-distended  MS: No edema; No deformity. Neuro:  Nonfocal  Psych: Normal affect   Labs    High Sensitivity Troponin:  No results for input(s): TROPONINIHS in the last 720 hours.    Chemistry Recent Labs  Lab 10/08/20 0508 10/09/20 0437 10/12/20 0525 10/13/20 0436 10/14/20 0502  NA 138   < > 138 143 139  K 3.2*   < > 3.7 3.6 3.8  CL 113*   < > 104 107 104  CO2 22   < > 30 29 31   GLUCOSE  130*   < > 101* 99 89  BUN 12   < > 11 9 8   CREATININE 0.80   < > 0.86 0.83 0.82  CALCIUM 7.8*   < > 7.8* 8.4* 8.2*  PROT 5.3*  --   --   --   --   ALBUMIN 2.2*  --   --   --   --   AST 18  --   --   --   --   ALT 22  --   --   --   --   ALKPHOS 46  --   --   --   --   BILITOT 0.4  --   --   --   --   GFRNONAA >60   < > >60 >60 >60  ANIONGAP 3*   < > 4* 7 4*   < > = values in this interval not displayed.     Hematology Recent Labs  Lab 10/12/20 0525 10/13/20 0436 10/14/20 0502  WBC 1.2* 1.5* 1.4*  RBC 3.58* 3.61* 3.52*  HGB 10.4* 10.5* 10.3*  HCT 32.6* 33.0* 30.9*  MCV 91.1 91.4 87.8  MCH 29.1 29.1 29.3  MCHC 31.9 31.8 33.3  RDW 15.9* 15.9* 15.7*  PLT 200 200 179    BNPNo results for input(s): BNP, PROBNP in the last 168 hours.   DDimer No results for input(s): DDIMER in the last 168 hours.   Radiology    ECHO TEE  Result Date: 10/13/2020    TRANSESOPHOGEAL ECHO REPORT   Patient Name:   John [name] Date of Exam: 10/13/2020 Medical Rec #:  [id number]     Height:       70.0 in Accession #:    [id number]    Weight:       204.0 lb Date of Birth:  21-Sep-1960     BSA:          2.105 m Patient Age:    60 years      BP:           100/68 mmHg Patient Gender: M             HR:           130 bpm. Exam Location:  Inpatient Procedure: Transesophageal Echo, Cardiac Doppler, Color Doppler and 3D Echo Indications:     Atrial fibrillation and Flutter  History:         Patient has prior history of Echocardiogram examinations, most                  recent 05/12/2020. Arrythmias:Atrial Fibrillation. GERD. Lung                  Cancer.  Sonographer:     Darlina Sicilian RDCS Referring Phys:  [id number] Leanor Kail Diagnosing Phys: Lyman Bishop MD PROCEDURE: After discussion of the risks and benefits of a TEE, an informed consent was obtained from the patient. TEE procedure time was 8 minutes. The transesophogeal probe was passed without difficulty through the esophogus of the patient. Imaged  were  obtained with the patient in a supine position. Sedation performed by different physician. The patient was monitored while under deep sedation. Anesthestetic sedation was provided intravenously by Anesthesiology: 276.56mg  of Propofol. Image quality was good. The patient's vital signs; including heart rate, blood pressure, and oxygen saturation; remained stable throughout the procedure. The patient developed no complications during the procedure. IMPRESSIONS  1. Left ventricular ejection fraction, by estimation, is 60 to 65%. The left ventricle has normal function.  2. Right ventricular systolic function is normal. The right ventricular size is normal.  3. No left atrial/left atrial appendage thrombus was detected.  4. The mitral valve is grossly normal. Trivial mitral valve regurgitation.  5. The aortic valve is tricuspid. Aortic valve regurgitation is not visualized.  6. Evidence of atrial level shunting detected by color flow Doppler. Agitated saline contrast bubble study was positive with shunting observed within 3-6 cardiac cycles suggestive of interatrial shunt. There is a small patent foramen ovale with bidirectional shunting across atrial septum. Comparison(s): A prior study was performed on 05/12/2020. LVEF 60-65%, afib was noted. Conclusion(s)/Recommendation(s): No LA/LAA thrombus identified. Successful cardioversion performed with restoration of normal sinus rhythm. FINDINGS  Left Ventricle: Left ventricular ejection fraction, by estimation, is 60 to 65%. The left ventricle has normal function. The left ventricular internal cavity size was normal in size. There is no left ventricular hypertrophy. Right Ventricle: The right ventricular size is normal. No increase in right ventricular wall thickness. Right ventricular systolic function is normal.  Left Atrium: Left atrial size was normal in size. No left atrial/left atrial appendage thrombus was detected. Right Atrium: Right atrial size was normal in  size. Pericardium: There is no evidence of pericardial effusion. Mitral Valve: The mitral valve is grossly normal. Trivial mitral valve regurgitation. Tricuspid Valve: The tricuspid valve is grossly normal. Tricuspid valve regurgitation is trivial. Aortic Valve: The aortic valve is tricuspid. Aortic valve regurgitation is not visualized. Pulmonic Valve: The pulmonic valve was grossly normal. Pulmonic valve regurgitation is trivial. Aorta: The aortic root and ascending aorta are structurally normal, with no evidence of dilitation. IAS/Shunts: Evidence of atrial level shunting detected by color flow Doppler. Agitated saline contrast was given intravenously to evaluate for intracardiac shunting. Agitated saline contrast bubble study was positive with shunting observed within 3-6 cardiac cycles suggestive of interatrial shunt. A small patent foramen ovale is detected with bidirectional shunting across atrial septum. EKG: Rhythm strip during this exam demostrated atrial fibrillation. Lyman Bishop MD Electronically signed by Lyman Bishop MD Signature Date/Time: 10/13/2020/5:23:12 PM    Final     Cardiac Studies   2d echo 05/12/20  1. Left ventricular ejection fraction, by estimation, is 60 to 65%. The  left ventricle has normal function. The left ventricle has no regional  wall motion abnormalities. Left ventricular diastolic parameters are  indeterminate.   2. Right ventricular systolic function is normal. The right ventricular  size is normal. Tricuspid regurgitation signal is inadequate for assessing  PA pressure.   3. The mitral valve is normal in structure. Trivial mitral valve  regurgitation. No evidence of mitral stenosis.   4. The aortic valve is tricuspid. Aortic valve regurgitation is not  visualized. No aortic stenosis is present.   5. The inferior vena cava is normal in size with greater than 50%  respiratory variability, suggesting right atrial pressure of 3 mmHg.   6. The patient is in  atrial fibrillation.   TEE/DCCV 06/13/20 Findings:   LEFT VENTRICLE: The left ventricular wall thickness is normal.  The left ventricular cavity is normal in size. Wall motion is normal.  LVEF is 60-65%.   RIGHT VENTRICLE:  The right ventricle is normal in structure and function without any thrombus or masses.     LEFT ATRIUM:  The left atrium is normal in size without any thrombus or masses.  There is not spontaneous echo contrast ("smoke") in the left atrium consistent with a low flow state.   LEFT ATRIAL APPENDAGE:  The small left atrial appendage is free of any thrombus or masses. The appendage has single lobes. Pulse doppler indicates low flow in the appendage.   ATRIAL SEPTUM:  The atrial septum demonstrates PFO with predominantly left to right flow by color doppler, however, interatrial shunting with right to left flow was demonstrated by saline microbubble contrast.   RIGHT ATRIUM:  The right atrium is normal in size and function without any thrombus or masses.   MITRAL VALVE:  The mitral valve is normal in structure and function with  trivial  regurgitation.  There were no vegetations or stenosis.   AORTIC VALVE:  The aortic valve is trileaflet, normal in structure and function with  no  regurgitation.  There were no vegetations or stenosis   TRICUSPID VALVE:  The tricuspid valve is normal in structure and function with  trivial  regurgitation.  There were no vegetations or stenosis    PULMONIC VALVE:  The pulmonic valve is normal in structure and function with  trivial  regurgitation.  There were no vegetations or stenosis.   AORTIC ARCH, ASCENDING AND DESCENDING AORTA:  There was no Ron Parker et. Al, 1992) atherosclerosis of the ascending aorta, aortic arch, or proximal descending aorta.   12. PULMONARY VEINS: Anomalous pulmonary venous return was not noted.   13. PERICARDIUM: The pericardium appeared normal and non-thickened.  There is no pericardial effusion.   CARDIOVERSION:       Second Time Out: Verified patient identification, verified procedure, site/side was marked, verified correct patient position, special equipment/implants available, medications/allergies/relevent history reviewed, required imaging and test results available.  Performed   Procedure:   Patient placed on cardiac monitor, pulse oximetry, supplemental oxygen as necessary.  Sedation administered per anesthesia Pacer pads placed anterior and posterior chest. Cardioverted 2 time(s).  Cardioverted at 200J biphasic.   Complications:   Complications: None Patient did tolerate procedure well.   Impression:   No LAA thrombus Small PFO with bidirectional flow Trivial MR, TR, PR LVEF 60-65%, normal wall motion Successful DCCV to NSR with frequent PAC's and atrial couplets after 2 stacked shocks at 200J biphasic    Patient Profile     60 y.o. 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 Dx with PAF in the setting of acute illness 04/2020, on amiodarone until stopped 07/2020 (CT scan changes at that time). This admission was admitted with fever and vomiting. He was found to have pseudomonal bacteremia, neuropenic fever, hypomagnesemia, hypokalemia, hyponatremia, and hypotension. HIV antibody was reactive but subsequent confirmatory testing unrevealing. He went into atrial fib RVR shortly after breathing treatment (duoneb).   Assessment & Plan   1. Neutropenic fever with pseudomonal bacteremia/sepsis - multiple issues on admission including abnormal CBC, electrolyte abnormalities, hypoalbuminemia, clinically improved - CT findings as noted -WBC 1.4 today    2. Paroxysmal atrial fib RVR  - patient feels incited by albuterol rx, feeling better with Xopenex - also has known pre-disposition to atrial fib as noted before and also in the  context of numerous physiologic stressors so challenging to know for certain, but added albuterol to list of meds to avoid in the future - CT also shows evidence of splenic infarcts which potentially raises CHADSVASC to 3 (atherosclerosis on CT, infarct event) - [name] has recommended initiation of anticoagulation due to thromboembolic risk with [id number] score 3 (Splenic infarct and coronary Ca on CT). - amiodarone previously d/c'd 07/2020 in setting of NSR / CT changes (?pulmonary toxicity) although on CT this admission those changes are favored to be post-radiation  -Discussed with Pulmonary and ok'd to restart Amio >> was on Amio 200mg  BID for HR control but due to persistently elevated HRs this was changed to IV Amio  -Underwent successful cardioversion yesterday - continue Lopressor 25mg  BID and IV Amio for rate control will change amio back to 200 mg BID for 2 weeks  then 200 mg daily  - continue Xarelto 20mg  daily --he now has appt in a fib clinic 10/28/20      3. 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   Medication Recommendations:  Amiodarone 200 mg BID x 2 weeks then decrease to 200 mg daily.  Continue metoprolol 25 mg twice daily and Xarelto 20 mg daily Other recommendations (labs, testing, etc):  None Follow up as an outpatient: Has follow-up in A. fib clinic on 9/13      For questions or updates, please contact [facility] Please consult [email or web address] for contact info under        Signed, Cecilie Kicks, NP  10/14/2020, 11:22 AM     Patient seen and examined.  Agree with above documentation.  On exam, patient is alert and oriented, regular rate and rhythm, no murmurs, lungs CTAB, no LE edema or JVD.  Telemetry shows sinus rhythm with rate 60s to 70s.  Underwent successful cardioversion yesterday, now back in sinus rhythm.  Switch to p.o. amiodarone, would load with 200 mg twice daily x2 weeks then decrease to 200 mg daily.  Has follow-up in [facility] fib clinic on 9/13.  Donato Heinz, MD

## 2020-10-14 NOTE — Progress Notes (Signed)
Mr. Angelino had a very busy day yesterday.  He was sent over to [facility].  He had a TEE.  This looked okay.  He then underwent cardioversion.  He got back into sinus rhythm.  He is still on the amiodarone drip.  Is not surprising as was a count dropped a little bit.  I think anything that will incur stress on his body will cause his white cells to decrease a little bit.  His white cell count 1.4.  Hemoglobin 10.3.  Platelet count 179,000.  His ANC is 400.  He has had no fever.  There is no cough.  He has had no nausea or vomiting.  He really did not eat much yesterday because of his procedures.  He is on oral vancomycin.  I think this is more prophylaxis with him being on the other antibiotics for the Pseudomonas.  His temperature is 98.3.  Pulse 69.  Blood pressure 111/73.  His lungs show a little bit of wheezing over on the right lung field.  Has good air movement bilaterally.  Cardiac exam regular rate and rhythm.  He may have extra beat.  Abdomen is soft.  Has good bowel sounds.  There is no fluid wave.  Extremities shows no clubbing, cyanosis or edema.  Again, I hope Mr. Mistry can go home.  He is still on the amiodarone drip.  I do not know if he will be converted over to oral amiodarone.  I know this has been incredibly complicated.  I know the staff is done a fantastic job with him on 4 E.  I know he is appreciative of their wonderful care.  [name], MD  Romans 8:31

## 2020-10-14 NOTE — Discharge Summary (Signed)
Physician Discharge Summary  John [name] WGN:[id number] DOB: 16-Feb-1960 DOA: 10/05/2020  PCP: [name], MD  Admit date: 10/05/2020 Discharge date: 10/14/2020  Admitted From: home Disposition:  home  Recommendations for Outpatient Follow-up:  Follow up with PCP in 1-2 weeks Check CBC in 1 week from your oncology call Dr. Antonieta Pert office Follow-up with cardiology post cardioversion follow-up in a fib clinic  Home Health:no  Equipment/Devices: none  Discharge Condition: Stable Code Status:   Code Status: Full Code Diet recommendation:  Diet Order             Diet Heart Room service appropriate? 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 fib not on anticoagulation due to low score, GERD, history of CAD, on Toprol admitted with With neutropenic fever, sepsis with SIRS criteria   In the ED febrile 100.1, hyponatremic neutropenic.  Placed on vancomycin/cefepime. Chest x-ray nonacute de-escalation could be radiation pneumonitis versus superimposed infection, normal lactic acid, UA no evidence of UTI, COVID-19 negative. CT chest: Increasingly conspicuous lymph nodes in the right hilar, mediastinal, lower right cervical and supraclavicular nodal stations concerning for potential metastatic disease, although some of this could be simply reactive . Satellite nodule measuring 11 mm in the right upper lobe, stable compared to the prior study.Right adrenal metastasis. 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 Granix ordered but patient declined given previous intolerance. WBC  count recently improving.  Patient found to have Pseudomonas sepsis-based on the sensitivity patient can go on Cipro through 8/28 and oral vancomycin until 9/4 for secondary C Diff prophylaxis. Patient had A. fib with RVR, seen by cardiology, placed on amiodarone 8/26 after pulmonary clearance. PO amio changed to IV 8/27. Due to uncontrolled A. fib he subsequently underwent TEE/DCCV 8/29 and remains in normal sinus rhythm.  Amiodarone changed to oral 8/30. Overall doing much better.  Does have leukopenia and neutropenia at this time.  To be chronic and stable for discharge from hematology standpoint. Informed by cardiology this afternoon that he can go home.  Discharge Diagnoses:  Neutropenic fever Pseudomonas sepsis POA Immunosuppressed on immunotherapy and on long-term steroid for RA: No clear source for bacteremia.  Completed Cipro 8/28 confirmed with ID today.  Cont vancomycin until 9/4 for secondary C Diff prophylaxis 2/2 hx of C DIFF in march.  Neutropenia with anemia, thrombocytopenia and neutropenia:Not due to immunotherapy per oncology, ?felty's syndrome.He has predated pancytopenia prior to his immunotherapy likely chronic component.  Drop could be 2/2  his sepsis.  Has been afebrile.  He will need outpatient follow-up for CBC check and monitoring.  Okay for discharge per [name].He declined granix due to previous intolerance. Recent Labs  Lab 10/10/20 0509 10/11/20 0542 10/12/20 0525 10/13/20 0436 10/14/20 0502  WBC 1.8* 1.3* 1.2* 1.5* 1.4*   PAF with RVR-Appreciate cardiology input, started on Amiodarone after pulm clearance)- BP too soft to increase beta-blocker,on metoprolol 25 - he subsequently underwent TEE/DCCV 8/29 and remains in normal sinus rhythm.  Amiodarone changed to oral 8/30.  Can go home after cardiology clearance.  He will continue oral Xarelto-  coupon already given  COPD with asthma  Radiation pneumonitis Wheezing Having bouts of cough triggering A. fib  shortness of breath few nights ago.  Continue po prednisone, xopenex,avoid albuterol.  Repeat chest x-ray  8/26-stable posttreatment appearance of right lung with presumed mostly radiation pneumonitis. PCCM cleared for amio use.  Cavitary right lung mass: Pulmonary consulted could be source of bacteremia. SCC stage IV lung SCC with adrenal metastasis on immunotherapy-Dr Ennever following.  CT chest shows lymph nodes in the right hilar mediastinal nor right cervical and supraclavicular node-?Potentially metastatic disease versus reactive lymph nodes.   Hypokalemia: replaced  Hypomagnesemia: replaced.  Hypotension- BP stable.[name]  Hyponatremia:suspect volume depletion. Resolved w ivf.   RA on long-term prednisone-5 mg at home s/p stress dose, cont po prednisone 10 mg 8/26> to hoem dose on d/c.  HIV Ab 1/2 AB: Came back negative. On admission screening test positive but HIV 1/2 AB is negative.   Consults: Cardiology Hematology  Subjective: Alert awake.  Remains in normal sinus rhythm.  No nausea vomiting chest pain.  Discharge Exam: Vitals:   10/14/20 0846 10/14/20 1220  BP: 113/75 111/68  Pulse: 91 61  Resp:    Temp:  98.3 F (36.8 C)  SpO2:  96%   General: Pt is alert, awake, not in acute distress Cardiovascular: RRR, S1/S2 +, no rubs, no gallops Respiratory: CTA bilaterally, no wheezing, no rhonchi Abdominal: Soft, NT, ND, bowel sounds + Extremities: no edema, no cyanosis  Discharge Instructions  Discharge Instructions     Discharge instructions   Complete by: As directed    Please call call MD or return to ER for similar or worsening recurring problem that brought you to hospital or if any fever,nausea/vomiting,abdominal pain, uncontrolled pain, chest pain,  shortness of breath or any other alarming symptoms.  Check CBC in 1 week for neutrophil counts monitoring  Follow-up with cardiology for post cardioversion follow-up  Please follow-up your doctor as instructed in  a week time and call the office for appointment.  Please avoid alcohol, smoking, or any other illicit substance and maintain healthy habits including taking your regular medications as prescribed.  You were cared for by a hospitalist during your hospital stay. If you have any questions about your discharge medications or the care you received while you were in the hospital after you are discharged, you can call the unit and ask to speak with the hospitalist on call if the hospitalist that took care of you is not available.  Once you are discharged, your primary care physician will handle any further medical issues. Please note that NO REFILLS for any discharge medications will be authorized once you are discharged, as it is imperative that you return to your primary care physician (or establish a relationship with a primary care physician if you do not have one) for your aftercare needs so that they can reassess your need for medications and monitor your lab values   Increase activity slowly   Complete by: As directed       Allergies as of 10/14/2020       Reactions   Albuterol    Patient has had episode of atrial fib following administration of albuterol (tolerates Xopenex well)        Medication List     TAKE these medications    acetaminophen 500 MG tablet Commonly known as: TYLENOL Take 500 mg by mouth every 6 (six) hours as needed for mild pain or moderate pain.   amiodarone 200 MG  tablet Commonly known as: PACERONE Take 1 tablet (200 mg total) by mouth 2 (two) times daily for 14 days.   amiodarone 200 MG tablet Commonly known as: PACERONE Take 1 tablet (200 mg total) by mouth daily. Start after finishing 2 weeks of amiodarone 200 mg twice daily Start taking on: October 28, 2020   levalbuterol 45 MCG/ACT inhaler Commonly known as: Xopenex HFA Inhale 2 puffs into the lungs every 4 (four) hours as needed for wheezing.   metoprolol tartrate 25 MG tablet Commonly known  as: LOPRESSOR Take 1 tablet (25 mg total) by mouth 2 (two) times daily.   PARoxetine 10 MG tablet Commonly known as: PAXIL Take 10 mg by mouth daily.   predniSONE 5 MG tablet Commonly known as: DELTASONE Take 5-10 mg by mouth daily.   PriLOSEC OTC 20 MG tablet Generic drug: omeprazole Take 20 mg by mouth at bedtime.   rivaroxaban 20 MG Tabs tablet Commonly known as: XARELTO Take 1 tablet (20 mg total) by mouth daily with supper.   vancomycin 125 MG capsule Commonly known as: VANCOCIN Take 1 capsule (125 mg total) by mouth 2 (two) times daily for 5 days.        Follow-up Information     [name], MD Follow up in 1 week(s).   Specialty: Internal Medicine Contact information: [name] [address] [phone]         Malka So R, [place] Follow up on 10/28/2020.   Specialty: Cardiology Why: at 0900 Am  call their office for parking code for Sept. Contact information: [place] [address] [phone]                Allergies  Allergen Reactions   Albuterol     Patient has had episode of atrial fib following administration of albuterol (tolerates Xopenex well)    The results of significant diagnostics from this hospitalization (including imaging, microbiology, ancillary and laboratory) are listed below for reference.    Microbiology: Recent Results (from the past 240 hour(s))  Blood Culture (routine x 2)     Status: Abnormal   Collection Time: 10/05/20  8:17 PM   Specimen: Right Antecubital; Blood  Result Value Ref Range Status   Specimen Description   Final    RIGHT ANTECUBITAL Performed at [facility], [address] [place], [place] [address]    Special Requests   Final    BOTTLES DRAWN AEROBIC AND ANAEROBIC Blood Culture adequate volume Performed at [facility], [address] [place], [place] [address]    Culture  Setup Time   Final    GRAM NEGATIVE RODS AEROBIC BOTTLE  ONLY CRITICAL RESULT CALLED TO, READ BACK BY AND VERIFIED WITH: E JACKSON,[email or web address]@[id number]  10/06/21 Valinda Performed at [facility], [place] [address], [place], [place] [address]    Culture PSEUDOMONAS AERUGINOSA (A)  Final   Report Status 10/09/2020 FINAL  Final   Organism ID, Bacteria PSEUDOMONAS AERUGINOSA  Final      Susceptibility   Pseudomonas aeruginosa - MIC*    CEFTAZIDIME 4 SENSITIVE Sensitive     CIPROFLOXACIN <=0.25 SENSITIVE Sensitive     GENTAMICIN <=1 SENSITIVE Sensitive     IMIPENEM 2 SENSITIVE Sensitive     PIP/TAZO 8 SENSITIVE Sensitive     CEFEPIME 2 SENSITIVE Sensitive     * PSEUDOMONAS AERUGINOSA  Blood Culture ID Panel (Reflexed)     Status: Abnormal   Collection Time:  10/05/20  8:17 PM  Result Value Ref Range Status   Enterococcus faecalis NOT DETECTED NOT DETECTED Final   Enterococcus Faecium NOT DETECTED NOT DETECTED Final   Listeria monocytogenes NOT DETECTED NOT DETECTED Final   Staphylococcus species NOT DETECTED NOT DETECTED Final   Staphylococcus aureus (BCID) NOT DETECTED NOT DETECTED Final   Staphylococcus epidermidis NOT DETECTED NOT DETECTED Final   Staphylococcus lugdunensis NOT DETECTED NOT DETECTED Final   Streptococcus species NOT DETECTED NOT DETECTED Final   Streptococcus agalactiae NOT DETECTED NOT DETECTED Final   Streptococcus pneumoniae NOT DETECTED NOT DETECTED Final   Streptococcus pyogenes NOT DETECTED NOT DETECTED Final   A.calcoaceticus-baumannii NOT DETECTED NOT DETECTED Final   Bacteroides fragilis NOT DETECTED NOT DETECTED Final   Enterobacterales NOT DETECTED NOT DETECTED Final   Enterobacter cloacae complex NOT DETECTED NOT DETECTED Final   Escherichia coli NOT DETECTED NOT DETECTED Final   Klebsiella aerogenes NOT DETECTED NOT DETECTED Final   Klebsiella oxytoca NOT DETECTED NOT DETECTED Final   Klebsiella pneumoniae NOT DETECTED NOT DETECTED Final   Proteus species NOT DETECTED NOT DETECTED Final   Salmonella species NOT  DETECTED NOT DETECTED Final   Serratia marcescens NOT DETECTED NOT DETECTED Final   Haemophilus influenzae NOT DETECTED NOT DETECTED Final   Neisseria meningitidis NOT DETECTED NOT DETECTED Final   Pseudomonas aeruginosa DETECTED (A) NOT DETECTED Final    Comment: CRITICAL RESULT CALLED TO, READ BACK BY AND VERIFIED WITH: E JACKSON,RN@[id number]  10/07/20 Mona    Stenotrophomonas maltophilia NOT DETECTED NOT DETECTED Final   Candida albicans NOT DETECTED NOT DETECTED Final   Candida auris NOT DETECTED NOT DETECTED Final   Candida glabrata NOT DETECTED NOT DETECTED Final   Candida krusei NOT DETECTED NOT DETECTED Final   Candida parapsilosis NOT DETECTED NOT DETECTED Final   Candida tropicalis NOT DETECTED NOT DETECTED Final   Cryptococcus neoformans/gattii NOT DETECTED NOT DETECTED Final   CTX-M ESBL NOT DETECTED NOT DETECTED Final   Carbapenem resistance IMP NOT DETECTED NOT DETECTED Final   Carbapenem resistance KPC NOT DETECTED NOT DETECTED Final   Carbapenem resistance NDM NOT DETECTED NOT DETECTED Final   Carbapenem resistance VIM NOT DETECTED NOT DETECTED Final    Comment: Performed at [facility], [address], [place], [place] [address]  Blood Culture (routine x 2)     Status: None   Collection Time: 10/05/20  8:22 PM   Specimen: BLOOD RIGHT HAND  Result Value Ref Range Status   Specimen Description   Final    BLOOD RIGHT HAND Performed at [facility], [address] [place], [place] [address]    Special Requests   Final    BOTTLES DRAWN AEROBIC AND ANAEROBIC Blood Culture results may not be optimal due to an inadequate volume of blood received in culture bottles Performed at [facility], [address] [place], [place] [address]    Culture   Final    NO GROWTH 5 DAYS Performed at [facility], [place] [address], [place], [place] [address]    Report Status 10/11/2020 FINAL  Final  Resp Panel by RT-PCR (Flu A&B, Covid) Nasopharyngeal Swab      Status: None   Collection Time: 10/05/20  8:40 PM   Specimen: Nasopharyngeal Swab; Nasopharyngeal(NP) swabs in vial transport medium  Result Value Ref Range Status   SARS Coronavirus 2 by RT PCR NEGATIVE NEGATIVE Final    Comment: (NOTE) SARS-CoV-2 target nucleic acids are NOT  DETECTED.  The SARS-CoV-2 RNA is generally detectable in upper respiratory specimens during the acute phase of infection. The lowest concentration of SARS-CoV-2 viral copies this assay can detect is 138 copies/mL. A negative result does not preclude SARS-Cov-2 infection and should not be used as the sole basis for treatment or other patient management decisions. A negative result may occur with  improper specimen collection/handling, submission of specimen other than nasopharyngeal swab, presence of viral mutation(s) within the areas targeted by this assay, and inadequate number of viral copies(<138 copies/mL). A negative result must be combined with clinical observations, patient history, and epidemiological information. The expected result is Negative.  Fact Sheet for Patients:  EntrepreneurPulse.com.au  Fact Sheet for Healthcare Providers:  IncredibleEmployment.be  This test is no t yet approved or cleared by the [place] FDA and  has been authorized for detection and/or diagnosis of SARS-CoV-2 by FDA under an Emergency Use Authorization (EUA). This EUA will remain  in effect (meaning this test can be used) for the duration of the COVID-19 declaration under Section 564(b)(1) of the Act, 21 U.S.C.section 360bbb-3(b)(1), unless the authorization is terminated  or revoked sooner.       Influenza A by PCR NEGATIVE NEGATIVE Final   Influenza B by PCR NEGATIVE NEGATIVE Final    Comment: (NOTE) The Xpert Xpress SARS-CoV-2/FLU/RSV plus assay is intended as an aid in the diagnosis of influenza from Nasopharyngeal swab specimens and should not be used as a sole basis  for treatment. Nasal washings and aspirates are unacceptable for Xpert Xpress SARS-CoV-2/FLU/RSV testing.  Fact Sheet for Patients: EntrepreneurPulse.com.au  Fact Sheet for Healthcare Providers: IncredibleEmployment.be  This test is not yet approved or cleared by the [place] FDA and has been authorized for detection and/or diagnosis of SARS-CoV-2 by FDA under an Emergency Use Authorization (EUA). This EUA will remain in effect (meaning this test can be used) for the duration of the COVID-19 declaration under Section 564(b)(1) of the Act, 21 U.S.C. section 360bbb-3(b)(1), unless the authorization is terminated or revoked.  Performed at [facility], [address], [place], [place] [address]   Urine Culture     Status: None   Collection Time: 10/05/20 10:15 PM   Specimen: In/Out Cath Urine  Result Value Ref Range Status   Specimen Description   Final    IN/OUT CATH URINE Performed at [facility], [address] [place], [place] [address]    Special Requests   Final    NONE Performed at [facility], [address] [place], [place] [address]    Culture   Final    NO GROWTH Performed at [facility], [place] [address] [place], [place] [address]    Report Status 10/07/2020 FINAL  Final    Procedures/Studies: CT CHEST W CONTRAST  Result Date: 10/06/2020 CLINICAL DATA:  60 year old male with history of non-small cell lung cancer. Evaluate for treatment response. EXAM: CT CHEST WITH CONTRAST TECHNIQUE: Multidetector CT imaging of the chest was performed during intravenous contrast administration. CONTRAST:  44mL OMNIPAQUE IOHEXOL 350 MG/ML SOLN COMPARISON:  Chest CT 07/18/2020. FINDINGS: Cardiovascular: Heart size is normal. There is no significant pericardial fluid, thickening or pericardial calcification. There is aortic atherosclerosis, as well as atherosclerosis of the great vessels of the  mediastinum and the coronary arteries, including calcified atherosclerotic plaque in the left anterior descending and left circumflex coronary arteries. Mediastinum/Nodes: Multiple prominent borderline enlarged and mildly enlarged mediastinal and right hilar  lymph nodes are noted, largest of which is in the subcarinal nodal station measuring 1.5 cm in short axis. There also multiple prominent but nonenlarged low right cervical and right supraclavicular lymph nodes, conspicuous by their number, which appear slightly increased compared to the prior examination. Esophagus is unremarkable in appearance. No axillary lymphadenopathy. Lungs/Pleura: Treated mass in the posterior aspect of the right upper lobe is largely obscured, now a more subtle area of cavitation (axial image 56 of series 5), currently measuring 3.9 x 2.6 cm. Extensive surrounding consolidative changes with associated ground-glass attenuation and septal thickening in the adjacent portions of the right lung, which likely reflect evolving postradiation changes. Satellite nodule in the right upper lobe (axial image 40 of series 5) measuring 11 mm, similar to the prior examination. Scattered areas of septal thickening are noted elsewhere in the lungs bilaterally. Mild diffuse bronchial wall thickening with mild centrilobular and paraseptal emphysema. Increasing pleural thickening and trace volume of pleural fluid in the right hemithorax. No left pleural effusion. Upper Abdomen: Large right adrenal mass again noted, currently measuring 4.7 x 3.3 cm, compatible with a metastatic lesion. Spleen is incompletely imaged, but appears likely enlarged measuring up to 15.0 x 8.5 cm on axial images. Again noted is a wedge-shaped area of low attenuation in the lateral aspect of the lower spleen, most compatible with an old splenic infarct. Calcified gallstone measuring 1.9 cm in the neck of the gallbladder. Musculoskeletal: There are no aggressive appearing lytic or  blastic lesions noted in the visualized portions of the skeleton. IMPRESSION: 1. Increasingly conspicuous lymph nodes in the right hilar, mediastinal, lower right cervical and supraclavicular nodal stations concerning for potential metastatic disease, although some of this could be simply reactive in the setting of evolving postradiation changes in the right lung. Attention on follow-up studies is recommended. Treated right upper lobe lesion is once again cavitary and less apparent than on the prior examination. 2. Satellite nodule measuring 11 mm in the right upper lobe, stable compared to the prior study. 3. Right adrenal metastasis, similar to the prior study. 4. 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 5. Cholelithiasis. 6. Aortic atherosclerosis, in addition to 2 vessel coronary artery disease. Please note that although the presence of coronary artery calcium documents the presence of coronary artery disease, the severity of this disease and any potential stenosis cannot be assessed on this non-gated CT examination. Assessment for potential risk factor modification, dietary therapy or pharmacologic therapy may be warranted, if clinically indicated. Aortic Atherosclerosis (ICD10-I70.0). Electronically Signed   By: Vinnie Langton M.D.   On: 10/06/2020 09:45   DG Chest Port 1 View  Result Date: 10/10/2020 CLINICAL DATA:  60 year old male with metastatic lung cancer. Cough. EXAM: PORTABLE CHEST 1 VIEW COMPARISON:  Chest CTA 10/06/2020 and earlier. FINDINGS: Portable AP semi upright view at 1002 hours. 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 Stable lung volumes and mediastinal contours. Visualized tracheal air column is within normal limits. No pneumothorax. No definite pleural effusion. Left lung remains stable. No acute osseous abnormality identified. IMPRESSION: Stable  post treatment appearance of the right lung with presumed mostly radiation pneumonitis on recent CT. No new cardiopulmonary abnormality. Electronically Signed   By: Genevie Ann M.D.   On: 10/10/2020 10:35   DG Chest Port 1 View  Result Date: 10/05/2020 CLINICAL DATA:  Sepsis, lung cancer EXAM: PORTABLE CHEST 1 VIEW COMPARISON:  05/12/2020, CT 07/18/2020 FINDINGS: There is dense consolidation within the right mid lung zone, progressive when compared to prior CT examination, with associated right-sided volume loss. Left lung is clear. No pneumothorax or pleural effusion. Cardiac size is within normal limits. No acute bone abnormality. IMPRESSION: Progressive, dense consolidation within the right mid lung zone. This may reflect progressive consolidative change in the setting of radiation pneumonitis,, progressive changes of post treatment fibrosis, or superimposed infection. Electronically Signed   By: Fidela Salisbury M.D.   On: 10/05/2020 21:04   ECHO TEE  Result Date: 10/13/2020    TRANSESOPHOGEAL ECHO REPORT   Patient Name:   [facility] Date of Exam: 10/13/2020 Medical Rec #:  [id number]     Height:       70.0 in Accession #:    [id number]    Weight:       204.0 lb Date of Birth:  24-Sep-1960     BSA:          2.105 m Patient Age:    60 years      BP:           100/68 mmHg Patient Gender: M             HR:           130 bpm. Exam Location:  Inpatient Procedure: Transesophageal Echo, Cardiac Doppler, Color Doppler and 3D Echo Indications:     Atrial fibrillation and Flutter  History:         Patient has prior history of Echocardiogram examinations, most                  recent 05/12/2020. Arrythmias:Atrial Fibrillation. GERD. Lung                  Cancer.  Sonographer:     Darlina Sicilian RDCS Referring Phys:  [id number] Leanor Kail Diagnosing Phys: Lyman Bishop MD PROCEDURE: After discussion of the risks and benefits of a TEE, an informed consent was obtained from the patient. TEE procedure time was 8 minutes.  The transesophogeal probe was passed without difficulty through the esophogus of the patient. Imaged were  obtained with the patient in a supine position. Sedation performed by different physician. The patient was monitored while under deep sedation. Anesthestetic sedation was provided intravenously by Anesthesiology: 276.56mg  of Propofol. Image quality was good. The patient's vital signs; including heart rate, blood pressure, and oxygen saturation; remained stable throughout the procedure. The patient developed no complications during the procedure. IMPRESSIONS  1. Left ventricular ejection fraction, by estimation, is 60 to 65%. The left ventricle has normal function.  2. Right ventricular systolic function is normal. The right ventricular size is normal.  3. No left atrial/left atrial appendage thrombus was detected.  4. The mitral valve is grossly normal. Trivial mitral valve regurgitation.  5. The aortic valve is tricuspid. Aortic valve regurgitation is not visualized.  6. Evidence of atrial level shunting detected by color flow Doppler. Agitated saline contrast bubble study was positive with shunting observed within 3-6 cardiac cycles suggestive of interatrial shunt. There is a small patent foramen ovale with bidirectional shunting across atrial septum. Comparison(s): A prior study was performed on 05/12/2020. LVEF 60-65%, afib was noted. Conclusion(s)/Recommendation(s): No LA/LAA thrombus identified. Successful cardioversion performed with restoration of normal sinus rhythm. FINDINGS  Left Ventricle: Left ventricular ejection fraction, by estimation, is 60 to 65%. The left ventricle has normal function. The left ventricular internal cavity size was normal in size. There is  no left ventricular hypertrophy. Right Ventricle: The right ventricular size is normal. No increase in right ventricular wall thickness. Right ventricular systolic function is normal. Left Atrium: Left atrial size was normal in size. No left  atrial/left atrial appendage thrombus was detected. Right Atrium: Right atrial size was normal in size. Pericardium: There is no evidence of pericardial effusion. Mitral Valve: The mitral valve is grossly normal. Trivial mitral valve regurgitation. Tricuspid Valve: The tricuspid valve is grossly normal. Tricuspid valve regurgitation is trivial. Aortic Valve: The aortic valve is tricuspid. Aortic valve regurgitation is not visualized. Pulmonic Valve: The pulmonic valve was grossly normal. Pulmonic valve regurgitation is trivial. Aorta: The aortic root and ascending aorta are structurally normal, with no evidence of dilitation. IAS/Shunts: Evidence of atrial level shunting detected by color flow Doppler. Agitated saline contrast was given intravenously to evaluate for intracardiac shunting. Agitated saline contrast bubble study was positive with shunting observed within 3-6 cardiac cycles suggestive of interatrial shunt. A small patent foramen ovale is detected with bidirectional shunting across atrial septum. EKG: Rhythm strip during this exam demostrated atrial fibrillation. Lyman Bishop MD Electronically signed by Lyman Bishop MD Signature Date/Time: 10/13/2020/5:23:12 PM    Final     Labs: BNP (last 3 results) No results for input(s): BNP in the last 8760 hours. Basic Metabolic Panel: Recent Labs  Lab 10/09/20 0437 10/11/20 0542 10/11/20 1553 10/12/20 0525 10/13/20 0436 10/14/20 0502  NA 140 139  --  138 143 139  K 3.5 2.9* 3.5 3.7 3.6 3.8  CL 112* 106  --  104 107 104  CO2 23 26  --  30 29 31   GLUCOSE 138* 85  --  101* 99 89  BUN 11 13  --  11 9 8   CREATININE 0.93 0.81  --  0.86 0.83 0.82  CALCIUM 8.1* 7.9*  --  7.8* 8.4* 8.2*  MG  --  1.8  --  2.0  --   --    Liver Function Tests: Recent Labs  Lab 10/08/20 0508  AST 18  ALT 22  ALKPHOS 46  BILITOT 0.4  PROT 5.3*  ALBUMIN 2.2*   No results for input(s): LIPASE, AMYLASE in the last 168 hours. No results for input(s): AMMONIA in  the last 168 hours. CBC: Recent Labs  Lab 10/10/20 0509 10/11/20 0542 10/12/20 0525 10/13/20 0436 10/14/20 0502  WBC 1.8* 1.3* 1.2* 1.5* 1.4*  NEUTROABS 0.6* 0.3* 0.3* 0.4* 0.4*  HGB 11.1* 9.6* 10.4* 10.5* 10.3*  HCT 34.0* 29.5* 32.6* 33.0* 30.9*  MCV 90.4 90.5 91.1 91.4 87.8  PLT 218 183 200 200 179   Cardiac Enzymes: No results for input(s): CKTOTAL, CKMB, CKMBINDEX, TROPONINI in the last 168 hours. BNP: Invalid input(s): POCBNP CBG: Recent Labs  Lab 10/09/20 2032  GLUCAP 146*   D-Dimer No results for input(s): DDIMER in the last 72 hours. Hgb A1c No results for input(s): HGBA1C in the last 72 hours. Lipid Profile No results for input(s): CHOL, HDL, LDLCALC, TRIG, CHOLHDL, LDLDIRECT in the last 72 hours. Thyroid function studies No results for input(s): TSH, T4TOTAL, T3FREE, THYROIDAB in the last 72 hours.  Invalid input(s): FREET3 Anemia work up No results for input(s): VITAMINB12, FOLATE, FERRITIN, TIBC, IRON, RETICCTPCT in the last 72 hours. Urinalysis    Component Value Date/Time   COLORURINE YELLOW 10/05/2020 2215   APPEARANCEUR CLEAR 10/05/2020 2215   LABSPEC 1.015 10/05/2020 2215   PHURINE 6.0 10/05/2020 2215   GLUCOSEU NEGATIVE 10/05/2020 2215   HGBUR NEGATIVE 10/05/2020  [place] 10/05/2020 2215   [place] 10/05/2020 2215   [place] 10/05/2020 2215   NITRITE NEGATIVE 10/05/2020 2215   LEUKOCYTESUR NEGATIVE 10/05/2020 2215   Sepsis Labs Invalid input(s): PROCALCITONIN,  WBC,  LACTICIDVEN Microbiology Recent Results (from the past 240 hour(s))  Blood Culture (routine x 2)     Status: Abnormal   Collection Time: 10/05/20  8:17 PM   Specimen: Right Antecubital; Blood  Result Value Ref Range Status   Specimen Description   Final    RIGHT ANTECUBITAL Performed at [facility], [address] [place], [place] [address]    Special Requests   Final    BOTTLES DRAWN AEROBIC AND ANAEROBIC Blood Culture  adequate volume Performed at [facility], [address] [place], [place] [address]    Culture  Setup Time   Final    GRAM NEGATIVE RODS AEROBIC BOTTLE ONLY CRITICAL RESULT CALLED TO, READ BACK BY AND VERIFIED WITH: E JACKSON,[id number]  10/06/21 [place] Performed at [facility], [place] [address] [place], [name] [address]    Culture PSEUDOMONAS AERUGINOSA (A)  Final   Report Status 10/09/2020 FINAL  Final   Organism ID, Bacteria PSEUDOMONAS AERUGINOSA  Final      Susceptibility   Pseudomonas aeruginosa - MIC*    CEFTAZIDIME 4 SENSITIVE Sensitive     CIPROFLOXACIN <=0.25 SENSITIVE Sensitive     GENTAMICIN <=1 SENSITIVE Sensitive     IMIPENEM 2 SENSITIVE Sensitive     PIP/TAZO 8 SENSITIVE Sensitive     CEFEPIME 2 SENSITIVE Sensitive     * PSEUDOMONAS AERUGINOSA  Blood Culture ID Panel (Reflexed)     Status: Abnormal   Collection Time: 10/05/20  8:17 PM  Result Value Ref Range Status   Enterococcus faecalis NOT DETECTED NOT DETECTED Final   Enterococcus Faecium NOT DETECTED NOT DETECTED Final   Listeria monocytogenes NOT DETECTED NOT DETECTED Final   Staphylococcus species NOT DETECTED NOT DETECTED Final   Staphylococcus aureus (BCID) NOT DETECTED NOT DETECTED Final   Staphylococcus epidermidis NOT DETECTED NOT DETECTED Final   Staphylococcus lugdunensis NOT DETECTED NOT DETECTED Final   Streptococcus species NOT DETECTED NOT DETECTED Final   Streptococcus agalactiae NOT DETECTED NOT DETECTED Final   Streptococcus pneumoniae NOT DETECTED NOT DETECTED Final   Streptococcus pyogenes NOT DETECTED NOT DETECTED Final   A.calcoaceticus-baumannii NOT DETECTED NOT DETECTED Final   Bacteroides fragilis NOT DETECTED NOT DETECTED Final   Enterobacterales NOT DETECTED NOT DETECTED Final   Enterobacter cloacae complex NOT DETECTED NOT DETECTED Final   Escherichia coli NOT DETECTED NOT DETECTED Final   Klebsiella aerogenes NOT DETECTED NOT DETECTED Final   Klebsiella oxytoca  NOT DETECTED NOT DETECTED Final   Klebsiella pneumoniae NOT DETECTED NOT DETECTED Final   Proteus species NOT DETECTED NOT DETECTED Final   Salmonella species NOT DETECTED NOT DETECTED Final   Serratia marcescens NOT DETECTED NOT DETECTED Final   Haemophilus influenzae NOT DETECTED NOT DETECTED Final   Neisseria meningitidis NOT DETECTED NOT DETECTED Final   Pseudomonas aeruginosa DETECTED (A) NOT DETECTED Final    Comment: CRITICAL RESULT CALLED TO, READ BACK BY AND VERIFIED WITH: E JACKSON,[email or web address]@[id number]  10/07/20 [place]    Stenotrophomonas maltophilia NOT DETECTED NOT DETECTED Final   Candida albicans NOT DETECTED NOT DETECTED Final   Candida auris NOT DETECTED NOT DETECTED Final   Candida glabrata NOT DETECTED NOT DETECTED Final   Candida krusei NOT DETECTED NOT DETECTED Final   Candida  parapsilosis NOT DETECTED NOT DETECTED Final   Candida tropicalis NOT DETECTED NOT DETECTED Final   Cryptococcus neoformans/gattii NOT DETECTED NOT DETECTED Final   CTX-M ESBL NOT DETECTED NOT DETECTED Final   Carbapenem resistance IMP NOT DETECTED NOT DETECTED Final   Carbapenem resistance KPC NOT DETECTED NOT DETECTED Final   Carbapenem resistance NDM NOT DETECTED NOT DETECTED Final   Carbapenem resistance VIM NOT DETECTED NOT DETECTED Final    Comment: Performed at [facility], [place] [address], [place], [place] [address]  Blood Culture (routine x 2)     Status: None   Collection Time: 10/05/20  8:22 PM   Specimen: BLOOD RIGHT HAND  Result Value Ref Range Status   Specimen Description   Final    BLOOD RIGHT HAND Performed at [facility], [address] [place], [place] [address]    Special Requests   Final    BOTTLES DRAWN AEROBIC AND ANAEROBIC Blood Culture results may not be optimal due to an inadequate volume of blood received in culture bottles Performed at [facility], [address] [place], [place] [address]    Culture   Final    NO GROWTH 5 DAYS Performed at  [facility], [place] [address], [place], [place] [address]    Report Status 10/11/2020 FINAL  Final  Resp Panel by RT-PCR (Flu A&B, Covid) Nasopharyngeal Swab     Status: None   Collection Time: 10/05/20  8:40 PM   Specimen: Nasopharyngeal Swab; Nasopharyngeal(NP) swabs in vial transport medium  Result Value Ref Range Status   SARS Coronavirus 2 by RT PCR NEGATIVE NEGATIVE Final    Comment: (NOTE) SARS-CoV-2 target nucleic acids are NOT DETECTED.  The SARS-CoV-2 RNA is generally detectable in upper respiratory specimens during the acute phase of infection. The lowest concentration of SARS-CoV-2 viral copies this assay can detect is 138 copies/mL. A negative result does not preclude SARS-Cov-2 infection and should not be used as the sole basis for treatment or other patient management decisions. A negative result may occur with  improper specimen collection/handling, submission of specimen other than nasopharyngeal swab, presence of viral mutation(s) within the areas targeted by this assay, and inadequate number of viral copies(<138 copies/mL). A negative result must be combined with clinical observations, patient history, and epidemiological information. The expected result is Negative.  Fact Sheet for Patients:  EntrepreneurPulse.com.au  Fact Sheet for Healthcare Providers:  IncredibleEmployment.be  This test is no t yet approved or cleared by the [place] FDA and  has been authorized for detection and/or diagnosis of SARS-CoV-2 by FDA under an Emergency Use Authorization (EUA). This EUA will remain  in effect (meaning this test can be used) for the duration of the COVID-19 declaration under Section 564(b)(1) of the Act, 21 U.S.C.section 360bbb-3(b)(1), unless the authorization is terminated  or revoked sooner.       Influenza A by PCR NEGATIVE NEGATIVE Final   Influenza B by PCR NEGATIVE NEGATIVE Final    Comment: (NOTE) The  Xpert Xpress SARS-CoV-2/FLU/RSV plus assay is intended as an aid in the diagnosis of influenza from Nasopharyngeal swab specimens and should not be used as a sole basis for treatment. Nasal washings and aspirates are unacceptable for Xpert Xpress SARS-CoV-2/FLU/RSV testing.  Fact Sheet for Patients: EntrepreneurPulse.com.au  Fact Sheet for Healthcare Providers: IncredibleEmployment.be  This test is not yet approved or cleared by the [place] FDA and has been authorized for detection and/or diagnosis of  SARS-CoV-2 by FDA under an Emergency Use Authorization (EUA). This EUA will remain in effect (meaning this test can be used) for the duration of the COVID-19 declaration under Section 564(b)(1) of the Act, 21 U.S.C. section 360bbb-3(b)(1), unless the authorization is terminated or revoked.  Performed at [facility], [address], [place], [place] [address]   Urine Culture     Status: None   Collection Time: 10/05/20 10:15 PM   Specimen: In/Out Cath Urine  Result Value Ref Range Status   Specimen Description   Final    IN/OUT CATH URINE Performed at [facility], [address] [place], [place] [address]    Special Requests   Final    NONE Performed at [facility], [address] [place], [place] [address]    Culture   Final    NO GROWTH Performed at [facility], [place] [address], [place], [place] [address]    Report Status 10/07/2020 FINAL  Final     Time coordinating discharge: 35 minutes  SIGNED: Antonieta Pert, MD  [facility] 10/14/2020, 12:51 PM  If 7PM-7AM, please contact night-coverage [email or web address]

## 2020-10-15 ENCOUNTER — Encounter (HOSPITAL_COMMUNITY): Payer: Self-pay | Admitting: Internal Medicine

## 2020-10-23 ENCOUNTER — Other Ambulatory Visit: Payer: Self-pay

## 2020-10-23 ENCOUNTER — Telehealth: Payer: Self-pay | Admitting: *Deleted

## 2020-10-23 ENCOUNTER — Inpatient Hospital Stay: Payer: 59 | Admitting: Family

## 2020-10-23 ENCOUNTER — Inpatient Hospital Stay: Payer: 59 | Attending: Internal Medicine

## 2020-10-23 ENCOUNTER — Ambulatory Visit: Payer: 59

## 2020-10-23 ENCOUNTER — Encounter: Payer: Self-pay | Admitting: Family

## 2020-10-23 VITALS — BP 109/66 | HR 66 | Temp 98.0°F | Resp 20 | Wt 207.0 lb

## 2020-10-23 DIAGNOSIS — C3491 Malignant neoplasm of unspecified part of right bronchus or lung: Secondary | ICD-10-CM

## 2020-10-23 DIAGNOSIS — C3411 Malignant neoplasm of upper lobe, right bronchus or lung: Secondary | ICD-10-CM | POA: Diagnosis present

## 2020-10-23 DIAGNOSIS — Z5112 Encounter for antineoplastic immunotherapy: Secondary | ICD-10-CM | POA: Insufficient documentation

## 2020-10-23 DIAGNOSIS — C7970 Secondary malignant neoplasm of unspecified adrenal gland: Secondary | ICD-10-CM | POA: Insufficient documentation

## 2020-10-23 DIAGNOSIS — D709 Neutropenia, unspecified: Secondary | ICD-10-CM

## 2020-10-23 LAB — CMP (CANCER CENTER ONLY)
ALT: 48 U/L — ABNORMAL HIGH (ref 0–44)
AST: 46 U/L — ABNORMAL HIGH (ref 15–41)
Albumin: 3.3 g/dL — ABNORMAL LOW (ref 3.5–5.0)
Alkaline Phosphatase: 124 U/L (ref 38–126)
Anion gap: 8 (ref 5–15)
BUN: 15 mg/dL (ref 6–20)
CO2: 27 mmol/L (ref 22–32)
Calcium: 9 mg/dL (ref 8.9–10.3)
Chloride: 102 mmol/L (ref 98–111)
Creatinine: 1.02 mg/dL (ref 0.61–1.24)
GFR, Estimated: >60 mL/min (ref >60)
Glucose, Bld: 112 mg/dL — ABNORMAL HIGH (ref 70–99)
Potassium: 3.7 mmol/L (ref 3.5–5.1)
Sodium: 137 mmol/L (ref 135–145)
Total Bilirubin: 0.8 mg/dL (ref 0.3–1.2)
Total Protein: 7 g/dL (ref 6.5–8.1)

## 2020-10-23 LAB — CBC WITH DIFFERENTIAL (CANCER CENTER ONLY)
Abs Immature Granulocytes: 0 10*3/uL (ref 0.00–0.07)
Basophils Absolute: 0 10*3/uL (ref 0.0–0.1)
Basophils Relative: 1 %
Eosinophils Absolute: 0.1 10*3/uL (ref 0.0–0.5)
Eosinophils Relative: 7 %
HCT: 34.3 % — ABNORMAL LOW (ref 39.0–52.0)
Hemoglobin: 11.3 g/dL — ABNORMAL LOW (ref 13.0–17.0)
Immature Granulocytes: 0 %
Lymphocytes Relative: 55 %
Lymphs Abs: 1 10*3/uL (ref 0.7–4.0)
MCH: 28.8 pg (ref 26.0–34.0)
MCHC: 32.9 g/dL (ref 30.0–36.0)
MCV: 87.5 fL (ref 80.0–100.0)
Monocytes Absolute: 0.4 10*3/uL (ref 0.1–1.0)
Monocytes Relative: 22 %
Neutro Abs: 0.3 10*3/uL — CL (ref 1.7–7.7)
Neutrophils Relative %: 15 %
Platelet Count: 277 10*3/uL (ref 150–400)
RBC: 3.92 MIL/uL — ABNORMAL LOW (ref 4.22–5.81)
RDW: 15.4 % (ref 11.5–15.5)
WBC Count: 1.7 10*3/uL — ABNORMAL LOW (ref 4.0–10.5)
nRBC: 0 % (ref 0.0–0.2)

## 2020-10-23 LAB — LACTATE DEHYDROGENASE: LDH: 205 U/L — ABNORMAL HIGH (ref 98–192)

## 2020-10-23 LAB — SAVE SMEAR(SSMR), FOR PROVIDER SLIDE REVIEW

## 2020-10-23 NOTE — Progress Notes (Signed)
Hematology and Oncology Follow Up Visit  John [name] [id number] February 26, 1960 60 y.o. 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 He was released from the hospital on 10/14/2020. He states that he has completed his antibiotics and is slowly starting to feel better.  He still notes fatigue and is concerned about susceptibility to infection.  He has Felty syndrome with associated RA, splenomegaly and neutropenia.  WBC count today is 1.7, ANC 0.3, Hgb 11.3, MCV 87 and platelets are 277.  He states that his albuterol breath treatment initiated his atrial fib and he is currently on amiodarone. Heart rate and rhythm regular on today's exam.  EF was 60-65% on recent ECHO TEE.  No fever, chills, n/v, rash, dizziness, chest pain, palpitations, abdominal pain or changes in bowel or bladder habits.  No blood loss noted. No bruising or petechiae.  No swelling, tenderness, numbness or tingling in his extremities at this time.  No falls or syncope to report.  He has a good appetite and is staying well hydrated throughout the day. His weight is stable at 207 lbs.   ECOG Performance Status: 1 - Symptomatic but completely ambulatory  Medications:  Allergies as of 10/23/2020       Reactions   Albuterol    Patient has had episode of atrial fib following administration of albuterol (tolerates Xopenex well)        Medication List        Accurate as of October 23, 2020 10:06 AM. If you have any questions, ask your nurse or doctor.          acetaminophen 500 MG tablet Commonly known as: TYLENOL Take 500 mg by mouth every 6 (six) hours as needed for mild pain or moderate pain.   amiodarone 200 MG tablet Commonly known as: PACERONE Take 1 tablet  (200 mg total) by mouth 2 (two) times daily for 14 days.   amiodarone 200 MG tablet Commonly known as: PACERONE Take 1 tablet (200 mg total) by mouth daily. Start after finishing 2 weeks of amiodarone 200 mg twice daily Start taking on: October 28, 2020   Keytruda 100 MG/4ML Soln Generic drug: pembrolizumab See admin instructions.   levalbuterol 45 MCG/ACT inhaler Commonly known as: Xopenex HFA Inhale 2 puffs into the lungs every 4 (four) hours as needed for wheezing.   metoprolol tartrate 25 MG tablet Commonly known as: LOPRESSOR Take 1 tablet (25 mg total) by mouth 2 (two) times daily.   PARoxetine 10 MG tablet Commonly known as: PAXIL Take 10 mg by mouth daily.   predniSONE 5 MG tablet Commonly known as: DELTASONE Take 5 mg by mouth daily.   PriLOSEC OTC 20 MG tablet Generic drug: omeprazole Take 20 mg by mouth at bedtime.   rivaroxaban 20 MG Tabs tablet Commonly known as: XARELTO Take 1 tablet (20 mg total) by mouth daily with supper.        Allergies:  Allergies  Allergen Reactions   Albuterol     Patient has had episode of atrial fib following administration of albuterol (tolerates Xopenex well)    Past Medical History, Surgical history, Social history, and Family History were reviewed and updated.  Review of Systems: All other 10  point review of systems is negative.   Physical Exam:  weight is 207 lb 0.6 oz (93.9 kg). His oral temperature is 98 F (36.7 C). His blood pressure is 109/66 and his pulse is 66. His respiration is 20 and oxygen saturation is 96%.   Wt Readings from Last 3 Encounters:  10/23/20 207 lb 0.6 oz (93.9 kg)  10/05/20 204 lb (92.5 kg)  09/30/20 208 lb (94.3 kg)    Ocular: Sclerae unicteric, pupils equal, round and reactive to light Ear-nose-throat: Oropharynx clear, dentition fair Lymphatic: No cervical, supraclavicular or axillary adenopathy Lungs no rales or rhonchi, good excursion bilaterally Heart regular rate and  rhythm, no murmur appreciated Abd soft, nontender, positive bowel sounds, no liver or spleen tip palpated on exam, no fluid wave  MSK no focal spinal tenderness, no joint edema Neuro: non-focal, well-oriented, appropriate affect Breasts: Deferred   Lab Results  Component Value Date   WBC 1.7 (L) 10/23/2020   HGB 11.3 (L) 10/23/2020   HCT 34.3 (L) 10/23/2020   MCV 87.5 10/23/2020   PLT 277 10/23/2020   No results found for: FERRITIN, IRON, TIBC, UIBC, IRONPCTSAT Lab Results  Component Value Date   RBC 3.92 (L) 10/23/2020   No results found for: KPAFRELGTCHN, LAMBDASER, KAPLAMBRATIO No results found for: IGGSERUM, IGA, IGMSERUM No results found for: Odetta Pink, SPEI   Chemistry      Component Value Date/Time   NA 137 10/23/2020 0829   K 3.7 10/23/2020 0829   CL 102 10/23/2020 0829   CO2 27 10/23/2020 0829   BUN 15 10/23/2020 0829   CREATININE 1.02 10/23/2020 0829      Component Value Date/Time   CALCIUM 9.0 10/23/2020 0829   ALKPHOS 124 10/23/2020 0829   AST 46 (H) 10/23/2020 0829   ALT 48 (H) 10/23/2020 0829   BILITOT 0.8 10/23/2020 0829       Impression and Plan: John [name] is a very pleasant 60 yo gentleman with metastatic squamous cell carcinoma of the right lung. Tumor had high PD-L1 score.  After discussed with the patient and his wife, we will hold treatment today and give him another week off to rest and recuperate from his recent hospitalization.  We will see him back next week and resume at that time if he is feeling better.  They were encouraged to contact our office with any questions or concerns. I also gave them both the [employer] wallet card for any after hour or weekend issues that may occur.   Laverna Peace, NP 9/8/202210:06 AM

## 2020-10-23 NOTE — Telephone Encounter (Signed)
Jory Ee NP notified of ANC-0.3.  No new orders received at this time.

## 2020-10-24 ENCOUNTER — Telehealth: Payer: Self-pay | Admitting: *Deleted

## 2020-10-24 NOTE — Telephone Encounter (Signed)
Per 10/23/20 los called and gave upcoming appointments - confirmed

## 2020-10-28 ENCOUNTER — Ambulatory Visit (HOSPITAL_COMMUNITY)
Admit: 2020-10-28 | Discharge: 2020-10-28 | Disposition: A | Discharge status: Home / Self Care | Payer: 59 | Source: Ambulatory Visit | Attending: Physician Assistant | Admitting: Physician Assistant

## 2020-10-28 ENCOUNTER — Encounter (HOSPITAL_COMMUNITY): Payer: Self-pay | Admitting: Physician Assistant

## 2020-10-28 ENCOUNTER — Other Ambulatory Visit: Payer: Self-pay

## 2020-10-28 VITALS — BP 104/76 | HR 80 | Ht 70.0 in | Wt 207.0 lb

## 2020-10-28 DIAGNOSIS — Z7901 Long term (current) use of anticoagulants: Secondary | ICD-10-CM | POA: Diagnosis not present

## 2020-10-28 DIAGNOSIS — Z09 Encounter for follow-up examination after completed treatment for conditions other than malignant neoplasm: Secondary | ICD-10-CM | POA: Insufficient documentation

## 2020-10-28 DIAGNOSIS — Z79899 Other long term (current) drug therapy: Secondary | ICD-10-CM | POA: Insufficient documentation

## 2020-10-28 DIAGNOSIS — Z85118 Personal history of other malignant neoplasm of bronchus and lung: Secondary | ICD-10-CM | POA: Insufficient documentation

## 2020-10-28 DIAGNOSIS — D6869 Other thrombophilia: Secondary | ICD-10-CM | POA: Diagnosis not present

## 2020-10-28 DIAGNOSIS — Z87891 Personal history of nicotine dependence: Secondary | ICD-10-CM | POA: Diagnosis not present

## 2020-10-28 DIAGNOSIS — I4819 Other persistent atrial fibrillation: Secondary | ICD-10-CM | POA: Diagnosis present

## 2020-10-28 HISTORY — DX: Other thrombophilia: D68.69

## 2020-10-28 NOTE — Progress Notes (Signed)
Primary Care Physician: John Nian, MD Primary Cardiologist: Dr John [name] Primary Electrophysiologist: none Referring Physician: Dr John [name] is a 60 y.o. male with a history of stage IIIb/IV lung cancer, RA, and atrial fibrillation who presents for consultation in the [facility].  The patient was initially diagnosed with atrial fibrillation 04/2020 after presenting to the ED with generalized weakness. He had been started on systemic chemotherapy recently PTA.  He was noted to be tachycardic in rapid atrial fibrillation.  He was hospitalized for further management of acute C diff diarrhea, sepsis and febrile neutropenia. He was started on amiodarone which converted him to SR. Anticoagulation was not started given elevated bleeding risk with tumor pressing on pulmonary artery. Patient has a CHADS2VASC score of 3. Patient has not had any recurrence of heart racing and feels "great". There was concern about lung toxicity with either amiodarone or Keytruda on CT 07/18/20 and his amiodarone was discontinued.   On follow up today, patient was hospitalized again 09/2020 with neutropenic fever with pseudomonal bacteremia/sepsis. During the admission, he developed recurrent afib. After d/w pulmonology, amiodarone was resumed and he underwent DCCV on 10/13/20. Patient reports that he feels well post discharge. He has not had any recurrent symptoms of afib. He was also started on anticoagulation and denies any bleeding issues.   Today, he denies symptoms of palpitations, chest pain, orthopnea, PND, lower extremity edema, dizziness, presyncope, syncope, snoring, daytime somnolence, bleeding, or neurologic sequela. The patient is tolerating medications without difficulties and is otherwise without complaint today.    Atrial Fibrillation Risk Factors:  he does not have symptoms or diagnosis of sleep apnea. he does not have a history of rheumatic fever. The patient does  have a history of early familial atrial fibrillation or other arrhythmias. Mother has afib.  he has a BMI of Body mass index is 29.7 kg/m.[name] Filed Weights   10/28/20 0858  Weight: 93.9 kg    Family History  Problem Relation Age of Onset   Arrhythmia Mother        has PPM   Heart disease Father        Died at 17, started in his 83s, heart attacks, had PPM and ICD     Atrial Fibrillation Management history:  Previous antiarrhythmic drugs: amiodarone  Previous cardioversions: 10/13/20 Previous ablations: none CHADS2VASC score: 3 Anticoagulation history: Xarelto    Past Medical History:  Diagnosis Date   Dyspnea    Family history of adverse reaction to anesthesia    brother with seizures had episode under anesthesia.  Patient has seizures as well.   GERD (gastroesophageal reflux disease)    History of radiation therapy 04/24/2020-05/16/2020   IMRT to right lung     Dr John [name]   PAF (paroxysmal atrial fibrillation) ([place])    CHADS2VSAC score 0   Rheumatoid aortitis    Squamous cell lung cancer [facility])    Past Surgical History:  Procedure Laterality Date   BRONCHIAL BRUSHINGS  03/27/2020   Procedure: BRONCHIAL BRUSHINGS;  Surgeon: John Gobble, MD;  Location: [place];  Service: Cardiopulmonary;;   BUBBLE STUDY  10/13/2020   Procedure: BUBBLE STUDY;  Surgeon: John Casino, MD;  Location: [place];  Service: Cardiovascular;;   CARDIOVERSION N/A 10/13/2020   Procedure: CARDIOVERSION;  Surgeon: John Casino, MD;  Location: [facility] ENDOSCOPY;  Service: Cardiovascular;  Laterality: N/A;   CATARACT EXTRACTION  2016   at [place]  03/27/2020   Procedure: FINE NEEDLE ASPIRATION;  Surgeon: John Gobble, MD;  Location: [facility] ENDOSCOPY;  Service: Cardiopulmonary;;   HIP SURGERY Left    TEE WITHOUT CARDIOVERSION N/A 10/13/2020   Procedure: TRANSESOPHAGEAL ECHOCARDIOGRAM (TEE);  Surgeon: John Casino, MD;  Location: [place];  Service:  Cardiovascular;  Laterality: N/A;   VIDEO BRONCHOSCOPY WITH ENDOBRONCHIAL ULTRASOUND N/A 03/27/2020   Procedure: VIDEO BRONCHOSCOPY WITH ENDOBRONCHIAL ULTRASOUND;  Surgeon: John Gobble, MD;  Location: [place];  Service: Cardiopulmonary;  Laterality: N/A;    Current Outpatient Medications  Medication Sig Dispense Refill   acetaminophen (TYLENOL) 500 MG tablet Take 500 mg by mouth every 6 (six) hours as needed for mild pain or moderate pain.     amiodarone (PACERONE) 200 MG tablet Take 1 tablet (200 mg total) by mouth daily. Start after finishing 2 weeks of amiodarone 200 mg twice daily 30 tablet 0   levalbuterol (XOPENEX HFA) 45 MCG/ACT inhaler Inhale 2 puffs into the lungs every 4 (four) hours as needed for wheezing. 1 each 12   metoprolol tartrate (LOPRESSOR) 25 MG tablet Take 1 tablet (25 mg total) by mouth 2 (two) times daily. 60 tablet 0   PARoxetine (PAXIL) 10 MG tablet Take 10 mg by mouth daily.     pembrolizumab (KEYTRUDA) 100 MG/4ML SOLN See admin instructions.     predniSONE (DELTASONE) 5 MG tablet Take 5 mg by mouth daily.     PRILOSEC OTC 20 MG tablet Take 20 mg by mouth at bedtime.     rivaroxaban (XARELTO) 20 MG TABS tablet Take 1 tablet (20 mg total) by mouth daily with supper. 30 tablet 0   No current facility-administered medications for this encounter.    Allergies  Allergen Reactions   Albuterol     Patient has had episode of atrial fib following administration of albuterol (tolerates Xopenex well)    Social History   Socioeconomic History   Marital status: Married    Spouse name: Not on file   Number of children: Not on file   Years of education: Not on file   Highest education level: Not on file  Occupational History   Not on file  Tobacco Use   Smoking status: Former    Packs/day: 1.00    Years: 30.00    Pack years: 30.00    Types: Cigarettes    Quit date: 2018    Years since quitting: 4.7   Smokeless tobacco: Never   Tobacco comments:     Former smoker 10/28/2020  Vaping Use   Vaping Use: Never used  Substance and Sexual Activity   Alcohol use: Not Currently   Drug use: Never   Sexual activity: Not on file  Other Topics Concern   Not on file  Social History Narrative   ** Merged History Encounter **       Social Determinants of Health   Financial Resource Strain: Not on file  Food Insecurity: Not on file  Transportation Needs: Not on file  Physical Activity: Not on file  Stress: Not on file  Social Connections: Not on file  Intimate Partner Violence: Not on file     ROS- All systems are reviewed and negative except as per the HPI above.  Physical Exam: Vitals:   10/28/20 0858  BP: 104/76  Pulse: 80  Weight: 93.9 kg  Height: 5\' 10"  (1.778 m)   GEN- The patient is a well appearing male, alert and oriented x 3 today.   HEENT-head normocephalic,  atraumatic, sclera clear, conjunctiva pink, hearing intact, trachea midline. Lungs- Clear to ausculation bilaterally, normal work of breathing Heart- Regular rate and rhythm, no murmurs, rubs or gallops  GI- soft, NT, ND, + BS Extremities- no clubbing, cyanosis, or edema MS- no significant deformity or atrophy Skin- no rash or lesion Psych- euthymic mood, full affect Neuro- strength and sensation are intact   Wt Readings from Last 3 Encounters:  10/28/20 93.9 kg  10/23/20 93.9 kg  10/05/20 92.5 kg    EKG today demonstrates  SR Vent. rate 80 BPM PR interval 158 ms QRS duration 78 ms QT/QTcB 406/468 ms  Echo 05/12/20 demonstrated   1. Left ventricular ejection fraction, by estimation, is 60 to 65%. The left ventricle has normal function. The left ventricle has no regional wall motion abnormalities. Left ventricular diastolic parameters are indeterminate.   2. Right ventricular systolic function is normal. The right ventricular  size is normal. Tricuspid regurgitation signal is inadequate for assessing PA pressure.   3. The mitral valve is normal in  structure. Trivial mitral valve  regurgitation. No evidence of mitral stenosis.   4. The aortic valve is tricuspid. Aortic valve regurgitation is not  visualized. No aortic stenosis is present.   5. The inferior vena cava is normal in size with greater than 50%  respiratory variability, suggesting right atrial pressure of 3 mmHg.   6. The patient is in atrial fibrillation.   Epic records are reviewed at length today  CHA2DS2-VASc Score = 3  The patient's score is based upon: CHF History: 0 HTN History: 0 Diabetes History: 0 Stroke History: 2 (possible spenic embolic event) Vascular Disease History: 1 Age Score: 0 Gender Score: 0       ASSESSMENT AND PLAN: 1. Persistent Atrial Fibrillation (ICD10:  I48.19) The patient's CHA2DS2-VASc score is 3, indicating a 3.2% annual risk of stroke.   S/p TEE/DCCV 10/13/20 [place] reloaded on amiodarone. Per pulmonary medicine, changes on lung imaging are not likely from amiodarone. Patient appears to be maintaining SR. Continue amiodarone 200 mg daily (starting daily dose tomorrow) Continue Xarelto 20 mg daily Continue Lopressor 25 mg BID  2. Secondary Hypercoagulable State (ICD10:  D68.69) The patient is at significant risk for stroke/thromboembolism based upon his CHA2DS2-VASc Score of 3.  Continue Rivaroxaban (Xarelto).   3. Non-small cell lung cancer Plans per pulmonology and oncology.   Follow up with Ermalinda Barrios as scheduled. AF clinic in 6 months.    [facility] [address] [place], [place] [address] [phone] 10/28/2020 10:39 AM

## 2020-10-30 ENCOUNTER — Inpatient Hospital Stay: Payer: 59

## 2020-10-30 ENCOUNTER — Telehealth: Payer: Self-pay | Admitting: *Deleted

## 2020-10-30 ENCOUNTER — Encounter: Payer: Self-pay | Admitting: Hematology & Oncology

## 2020-10-30 ENCOUNTER — Other Ambulatory Visit: Payer: Self-pay

## 2020-10-30 ENCOUNTER — Inpatient Hospital Stay (HOSPITAL_BASED_OUTPATIENT_CLINIC_OR_DEPARTMENT_OTHER): Payer: 59 | Admitting: Hematology & Oncology

## 2020-10-30 ENCOUNTER — Ambulatory Visit: Payer: 59

## 2020-10-30 VITALS — BP 107/70 | HR 72 | Temp 98.1°F | Resp 18 | Wt 209.0 lb

## 2020-10-30 DIAGNOSIS — C3491 Malignant neoplasm of unspecified part of right bronchus or lung: Secondary | ICD-10-CM

## 2020-10-30 DIAGNOSIS — D709 Neutropenia, unspecified: Secondary | ICD-10-CM

## 2020-10-30 DIAGNOSIS — Z5112 Encounter for antineoplastic immunotherapy: Secondary | ICD-10-CM | POA: Diagnosis not present

## 2020-10-30 LAB — CMP (CANCER CENTER ONLY)
ALT: 24 U/L (ref 0–44)
AST: 28 U/L (ref 15–41)
Albumin: 3.4 g/dL — ABNORMAL LOW (ref 3.5–5.0)
Alkaline Phosphatase: 110 U/L (ref 38–126)
Anion gap: 8 (ref 5–15)
BUN: 12 mg/dL (ref 6–20)
CO2: 28 mmol/L (ref 22–32)
Calcium: 9.2 mg/dL (ref 8.9–10.3)
Chloride: 101 mmol/L (ref 98–111)
Creatinine: 1.01 mg/dL (ref 0.61–1.24)
GFR, Estimated: >60 mL/min (ref >60)
Glucose, Bld: 139 mg/dL — ABNORMAL HIGH (ref 70–99)
Potassium: 4.7 mmol/L (ref 3.5–5.1)
Sodium: 137 mmol/L (ref 135–145)
Total Bilirubin: 0.8 mg/dL (ref 0.3–1.2)
Total Protein: 6.8 g/dL (ref 6.5–8.1)

## 2020-10-30 LAB — CBC WITH DIFFERENTIAL (CANCER CENTER ONLY)
Abs Immature Granulocytes: 0.01 10*3/uL (ref 0.00–0.07)
Basophils Absolute: 0 10*3/uL (ref 0.0–0.1)
Basophils Relative: 1 %
Eosinophils Absolute: 0.1 10*3/uL (ref 0.0–0.5)
Eosinophils Relative: 7 %
HCT: 34.7 % — ABNORMAL LOW (ref 39.0–52.0)
Hemoglobin: 11.3 g/dL — ABNORMAL LOW (ref 13.0–17.0)
Immature Granulocytes: 1 %
Lymphocytes Relative: 48 %
Lymphs Abs: 0.9 10*3/uL (ref 0.7–4.0)
MCH: 29.3 pg (ref 26.0–34.0)
MCHC: 32.6 g/dL (ref 30.0–36.0)
MCV: 89.9 fL (ref 80.0–100.0)
Monocytes Absolute: 0.4 10*3/uL (ref 0.1–1.0)
Monocytes Relative: 22 %
Neutro Abs: 0.4 10*3/uL — CL (ref 1.7–7.7)
Neutrophils Relative %: 21 %
Platelet Count: 222 10*3/uL (ref 150–400)
RBC: 3.86 MIL/uL — ABNORMAL LOW (ref 4.22–5.81)
RDW: 16 % — ABNORMAL HIGH (ref 11.5–15.5)
WBC Count: 1.8 10*3/uL — ABNORMAL LOW (ref 4.0–10.5)
nRBC: 0 % (ref 0.0–0.2)

## 2020-10-30 LAB — SAVE SMEAR(SSMR), FOR PROVIDER SLIDE REVIEW

## 2020-10-30 LAB — LACTATE DEHYDROGENASE: LDH: 207 U/L — ABNORMAL HIGH (ref 98–192)

## 2020-10-30 MED ORDER — SODIUM CHLORIDE 0.9 % IV SOLN: Freq: Once | INTRAVENOUS | Status: AC

## 2020-10-30 MED ORDER — SODIUM CHLORIDE 0.9 % IV SOLN
200.0000 mg | Freq: Once | INTRAVENOUS | Status: AC
  Administered 2020-10-30: 200 mg via INTRAVENOUS
  Filled 2020-10-30: qty 8

## 2020-10-30 NOTE — Telephone Encounter (Signed)
[name] notified of ANC-0.4.  No new orders received at this time.

## 2020-10-30 NOTE — Patient Instructions (Signed)
John [name] AT [facility]  Discharge Instructions: Thank you for choosing [facility] to provide your oncology and hematology care.   If you have a lab appointment with the [facility], please go directly to the [facility] and check in at the registration area.  Wear comfortable clothing and clothing appropriate for easy access to any Portacath or PICC line.   We strive to give you quality time with your provider. You may need to reschedule your appointment if you arrive late (15 or more minutes).  Arriving late affects you and other patients whose appointments are after yours.  Also, if you miss three or more appointments without notifying the office, you may be dismissed from the clinic at the provider's discretion.      For prescription refill requests, have your pharmacy contact our office and allow 72 hours for refills to be completed.    Today you received the following chemotherapy and/or immunotherapy agents Keytruda.      To help prevent nausea and vomiting after your treatment, we encourage you to take your nausea medication as directed.  BELOW ARE SYMPTOMS THAT SHOULD BE REPORTED IMMEDIATELY: *FEVER GREATER THAN 100.4 F (38 C) OR HIGHER *CHILLS OR SWEATING *NAUSEA AND VOMITING THAT IS NOT CONTROLLED WITH YOUR NAUSEA MEDICATION *UNUSUAL SHORTNESS OF BREATH *UNUSUAL BRUISING OR BLEEDING *URINARY PROBLEMS (pain or burning when urinating, or frequent urination) *BOWEL PROBLEMS (unusual diarrhea, constipation, pain near the anus) TENDERNESS IN MOUTH AND THROAT WITH OR WITHOUT PRESENCE OF ULCERS (sore throat, sores in mouth, or a toothache) UNUSUAL RASH, SWELLING OR PAIN  UNUSUAL VAGINAL DISCHARGE OR ITCHING   Items with * indicate a potential emergency and should be followed up as soon as possible or go to the Emergency Department if any problems should occur.  Please show the CHEMOTHERAPY ALERT CARD or IMMUNOTHERAPY ALERT CARD at check-in to the  Emergency Department and triage nurse. Should you have questions after your visit or need to cancel or reschedule your appointment, please contact [facility]  [phone] and follow the prompts.  Office hours are 8:00 a.m. to 4:30 p.m. Monday - Friday. Please note that voicemails left after 4:00 p.m. may not be returned until the following business day.  We are closed weekends and major holidays. You have access to a nurse at all times for urgent questions. Please call the main number to the clinic [phone] and follow the prompts.  For any non-urgent questions, you may also contact your provider using MyChart. We now offer e-Visits for anyone 64 and older to request care online for non-urgent symptoms. For details visit [email or web address].   Also download the MyChart app! Go to the app store, search "MyChart", open the app, select [facility], and log in with your MyChart username and password.  Due to Covid, a mask is required upon entering the hospital/clinic. If you do not have a mask, one will be given to you upon arrival. For doctor visits, patients may have 1 support person aged 66 or older with them. For treatment visits, patients cannot have anyone with them due to current Covid guidelines and our immunocompromised population.

## 2020-10-30 NOTE — Addendum Note (Signed)
Addended by: Burney Gauze R on: 10/30/2020 11:37 AM   Modules accepted: Orders

## 2020-10-30 NOTE — Progress Notes (Signed)
Hematology and Oncology Follow Up Visit  John [name] [id number] 1961/01/30 60 y.o. 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  He was hospitalized for almost a couple weeks.  He had a chronic few things going on.  He had Pseudomonas in his blood.  He is chronically neutropenic.  He had atrial fibrillation.  I think he is on amiodarone right now.  Otherwise, he seems to be doing okay.  He still is tired.  He had a fairly prolonged hospital stay.  A lot was done in the hospital.  There is been no problems with cough right now.  He has had no nausea or vomiting.  He has had no issues with rashes.  He is chronically neutropenic.  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  He does have rheumatoid arthritis.  There was not much we can do about this.  His appetite is doing okay.  He is gained a little bit of weight.  He has had no problems with bowels or bladder.  Currently, his performance status is ECOG 1.    Medications:  Allergies as of 10/30/2020       Reactions   Albuterol    Patient has had episode of atrial fib following administration of albuterol (tolerates Xopenex well)        Medication List        Accurate as of October 30, 2020 10:59 AM. If you have any questions, ask your nurse or doctor.          acetaminophen 500 MG tablet Commonly known as: TYLENOL Take 500 mg by mouth every 6 (six) hours as needed for mild pain or moderate pain.   amiodarone 200 MG tablet Commonly known as: PACERONE Take 1 tablet (200 mg total) by mouth daily. Start after finishing 2 weeks of amiodarone 200 mg twice daily   Keytruda 100 MG/4ML Soln Generic drug: pembrolizumab See admin instructions.   levalbuterol 45 MCG/ACT  inhaler Commonly known as: Xopenex HFA Inhale 2 puffs into the lungs every 4 (four) hours as needed for wheezing.   metoprolol tartrate 25 MG tablet Commonly known as: LOPRESSOR Take 1 tablet (25 mg total) by mouth 2 (two) times daily.   PARoxetine 10 MG tablet Commonly known as: PAXIL Take 10 mg by mouth daily.   predniSONE 5 MG tablet Commonly known as: DELTASONE Take 5 mg by mouth daily.   PriLOSEC OTC 20 MG tablet Generic drug: omeprazole Take 20 mg by mouth at bedtime.   rivaroxaban 20 MG Tabs tablet Commonly known as: XARELTO Take 1 tablet (20 mg total) by mouth daily with supper.        Allergies:  Allergies  Allergen Reactions   Albuterol     Patient has had episode of atrial fib following administration of albuterol (tolerates Xopenex well)    Past Medical History, Surgical history, Social history, and Family History were reviewed and updated.  Review of Systems: Review of Systems  Constitutional:  Positive for malaise/fatigue.  HENT: Negative.    Eyes: Negative.   Respiratory:  Positive for cough and shortness of breath.   Cardiovascular:  Positive for chest pain.  Gastrointestinal: Negative.   Genitourinary: Negative.   Musculoskeletal:  Positive for joint pain.  Skin: Negative.   Neurological:  Positive for focal weakness.  Endo/Heme/Allergies: Negative.   Psychiatric/Behavioral: Negative.      Physical Exam:  weight is 209 lb (94.8 kg). His oral temperature is 98.1 F (36.7 C). His blood pressure is 107/70 and his pulse is 72. His respiration is 18 and oxygen saturation is 99%.   Wt Readings from Last 3 Encounters:  10/30/20 209 lb (94.8 kg)  10/28/20 207 lb (93.9 kg)  10/23/20 207 lb 0.6 oz (93.9 kg)    Physical Exam Vitals reviewed.  HENT:     Head: Normocephalic and atraumatic.  Eyes:     Pupils: Pupils are equal, round, and reactive to light.  Cardiovascular:     Rate and Rhythm: Normal rate and regular rhythm.     Heart sounds:  Normal heart sounds.  Pulmonary:     Effort: Pulmonary effort is normal.     Breath sounds: Normal breath sounds.  Abdominal:     General: Bowel sounds are normal.     Palpations: Abdomen is soft.  Musculoskeletal:        General: No tenderness or deformity. Normal range of motion.     Cervical back: Normal range of motion.  Lymphadenopathy:     Cervical: No cervical adenopathy.  Skin:    General: Skin is warm and dry.     Findings: No erythema or rash.  Neurological:     Mental Status: He is alert and oriented to person, place, and time.  Psychiatric:        Behavior: Behavior normal.        Thought Content: Thought content normal.        Judgment: Judgment normal.    Lab Results  Component Value Date   WBC 1.8 (L) 10/30/2020   HGB 11.3 (L) 10/30/2020   HCT 34.7 (L) 10/30/2020   MCV 89.9 10/30/2020   PLT 222 10/30/2020   No results found for: FERRITIN, IRON, TIBC, UIBC, IRONPCTSAT Lab Results  Component Value Date   RBC 3.86 (L) 10/30/2020   No results found for: KPAFRELGTCHN, LAMBDASER, KAPLAMBRATIO No results found for: IGGSERUM, IGA, IGMSERUM No results found for: Ronnald Ramp, A1GS, A2GS, Tillman Sers, SPEI   Chemistry      Component Value Date/Time   NA 137 10/30/2020 1030   K 4.7 10/30/2020 1030   CL 101 10/30/2020 1030   CO2 28 10/30/2020 1030   BUN 12 10/30/2020 1030   CREATININE 1.01 10/30/2020 1030      Component Value Date/Time   CALCIUM 9.2 10/30/2020 1030   ALKPHOS 110 10/30/2020 1030   AST 28 10/30/2020 1030   ALT 24 10/30/2020 1030   BILITOT 0.8 10/30/2020 1030       Impression and Plan: John [name] is a very pleasant 60 yo gentleman with metastatic squamous cell carcinoma of the right lung. Tumor had high PD-L1 score.   I just feel bad that he was hospitalized.  I know this was a tough hospitalization for him.  He had the Pseudomonas in his blood.  Thankfully, there is no evidence that it seeded his heart  valves.  His atrial fibrillation certainly is back in sinus rhythm for I can tell on exam.  He is on amiodarone.  Hopefully he will not be on amiodarone for all that long.  We will go ahead with his pembrolizumab.  I know he had a CT scan done in the hospital.  I think some  of the lymph nodes that were enlarged could certainly be reactive from the pneumonia and bacteremia.  I will plan to get him back to see [place] in another 3 weeks or so.   Volanda Napoleon, MD 9/15/202210:59 AM

## 2020-10-31 ENCOUNTER — Telehealth: Payer: Self-pay

## 2020-11-12 NOTE — Progress Notes (Signed)
Cardiology Office Note    Date:  11/19/2020   ID:  John [name], DOB 1960-09-08, MRN [id number]   PCP:  [name], [facility]  Cardiologist:  Fransico Him, MD   Advanced Practice Provider:  No care team member to display Electrophysiologist:  None   [phone]   Chief Complaint  Patient presents with   Follow-up     History of Present Illness:  John [name] is a 60 y.o. male with history of stage IIIb/IV lung cancer, RA, atrial fibrillation diagnosed 04/2020.  He was started on amiodarone and converted to normal sinus rhythm.  Anticoagulation was initially not started given his elevated bleeding risk with tumor pressing on pulmonary artery.  CHA2DS2-VASc equals 3.  There was concern about lung toxicity with either amiodarone or Keytruda on CT 07/18/2020 and amiodarone was stopped.  Hospitalized 09/2020 with neutropenic fever and pseudomonal bacteremia/sepsis.  He had recurrent atrial fibrillation and after discussion with pulmonary who did not feel like changes on lung imaging were from amiodarone, amiodarone was resumed and he underwent cardioversion 10/13/2020.  He was placed on Xarelto. Coronary calcification 2 vessels on CT 09/2020.   Patient was seen in the A. fib clinic 10/28/2020 and was maintained on amiodarone.  Patient comes in for f/u. Says albuterol treatment is what put him in afib. No recurrence. Denies chest pain. Has chronic shortness of breath from lung CA. Tolerating Keytruda well and says it's helped his RA. BP running low and a little dizzy on metoprolol. Strong family history of CAD-father 5 MI's, ICD.LDL 94 09/2020.      Past Medical History:  Diagnosis Date   Dyspnea    Family history of adverse reaction to anesthesia    brother with seizures had episode under anesthesia.  Patient has seizures as well.   GERD (gastroesophageal reflux disease)    History of radiation therapy 04/24/2020-05/16/2020   IMRT to right lung     Dr  [name]   PAF (paroxysmal atrial fibrillation) ([place])    CHADS2VSAC score 0   Rheumatoid aortitis    Squamous cell lung cancer [facility])     Past Surgical History:  Procedure Laterality Date   BRONCHIAL BRUSHINGS  03/27/2020   Procedure: BRONCHIAL BRUSHINGS;  Surgeon: [name], MD;  Location: [place];  Service: Cardiopulmonary;;   BUBBLE STUDY  10/13/2020   Procedure: BUBBLE STUDY;  Surgeon: Pixie Casino, MD;  Location: [place];  Service: Cardiovascular;;   CARDIOVERSION N/A 10/13/2020   Procedure: CARDIOVERSION;  Surgeon: Pixie Casino, MD;  Location[facility] ENDOSCOPY;  Service: Cardiovascular;  Laterality: N/A;   CATARACT EXTRACTION  2016   at [place]  03/27/2020   Procedure: FINE NEEDLE ASPIRATION;  Surgeon: [name], MD;  Location: [place];  Service: Cardiopulmonary;;   HIP SURGERY Left    TEE WITHOUT CARDIOVERSION N/A 10/13/2020   Procedure: TRANSESOPHAGEAL ECHOCARDIOGRAM (TEE);  Surgeon: Pixie Casino, MD;  Location: [place];  Service: Cardiovascular;  Laterality: N/A;   VIDEO BRONCHOSCOPY WITH ENDOBRONCHIAL ULTRASOUND N/A 03/27/2020   Procedure: VIDEO BRONCHOSCOPY WITH ENDOBRONCHIAL ULTRASOUND;  Surgeon: [name], MD;  Location: [place];  Service: Cardiopulmonary;  Laterality: N/A;    Current Medications: Current Meds  Medication Sig   acetaminophen (TYLENOL) 500 MG tablet Take 500 mg by mouth every 6 (six) hours as needed for mild pain or moderate pain.   amiodarone (PACERONE) 200 MG tablet Take 1 tablet (200  mg total) by mouth daily. Start after finishing 2 weeks of amiodarone 200 mg twice daily   atorvastatin (LIPITOR) 10 MG tablet Take 1 tablet (10 mg total) by mouth daily.   levalbuterol (XOPENEX HFA) 45 MCG/ACT inhaler Inhale 2 puffs into the lungs every 4 (four) hours as needed for wheezing.   metoprolol succinate (TOPROL XL) 25 MG 24 hr tablet Take 1 tablet (25 mg total) by mouth daily.   PARoxetine  (PAXIL) 10 MG tablet Take 10 mg by mouth daily.   pembrolizumab (KEYTRUDA) 100 MG/4ML SOLN See admin instructions.   predniSONE (DELTASONE) 5 MG tablet Take 5 mg by mouth daily.   PRILOSEC OTC 20 MG tablet Take 20 mg by mouth at bedtime.   rivaroxaban (XARELTO) 20 MG TABS tablet Take 1 tablet (20 mg total) by mouth daily with supper.   [DISCONTINUED] metoprolol tartrate (LOPRESSOR) 25 MG tablet Take 1 tablet (25 mg total) by mouth 2 (two) times daily.     Allergies:   Albuterol   Social History   Socioeconomic History   Marital status: Married    Spouse name: Not on file   Number of children: Not on file   Years of education: Not on file   Highest education level: Not on file  Occupational History   Not on file  Tobacco Use   Smoking status: Former    Packs/day: 1.00    Years: 30.00    Pack years: 30.00    Types: Cigarettes    Quit date: 2018    Years since quitting: 4.7   Smokeless tobacco: Never   Tobacco comments:    Former smoker 10/28/2020  Vaping Use   Vaping Use: Never used  Substance and Sexual Activity   Alcohol use: Not Currently   Drug use: Never   Sexual activity: Not on file  Other Topics Concern   Not on file  Social History Narrative   ** Merged History Encounter **       Social Determinants of Health   Financial Resource Strain: Not on file  Food Insecurity: Not on file  Transportation Needs: Not on file  Physical Activity: Not on file  Stress: Not on file  Social Connections: Not on file     Family History:  The patient's  family history includes Arrhythmia in his mother; Heart disease in his father.   ROS:   Please see the history of present illness.    ROS All other systems reviewed and are negative.   PHYSICAL EXAM:   VS:  BP 100/60 (BP Location: Left Arm, Patient Position: Sitting, Cuff Size: Normal)   Pulse 79   Ht 5\' 10"  (1.778 m)   Wt 214 lb (97.1 kg)   SpO2 95%   BMI 30.71 kg/m   Physical Exam  GEN: Well nourished, well  developed, in no acute distress  Neck: no JVD, carotid bruits, or masses Cardiac:RRR; no murmurs, rubs, or gallops  Respiratory:  clear to auscultation bilaterally, normal work of breathing GI: soft, nontender, nondistended, + BS Ext: without cyanosis, clubbing, or edema, Good distal pulses bilaterally Neuro:  Alert and Oriented x 3 Psych: euthymic mood, full affect  Wt Readings from Last 3 Encounters:  11/19/20 214 lb (97.1 kg)  10/30/20 209 lb (94.8 kg)  10/28/20 207 lb (93.9 kg)      Studies/Labs Reviewed:   EKG:  EKG is not ordered today.     Recent Labs: 10/06/2020: TSH 2.992 10/12/2020: Magnesium 2.0 10/30/2020: ALT 24; BUN 12;  Creatinine 1.01; Hemoglobin 11.3; Platelet Count 222; Potassium 4.7; Sodium 137   Lipid Panel    Component Value Date/Time   CHOL 133 10/10/2020 0509   TRIG 87 10/10/2020 0509   HDL 22 (L) 10/10/2020 0509   CHOLHDL 6.0 10/10/2020 0509   VLDL 17 10/10/2020 0509   LDLCALC 94 10/10/2020 0509    Additional studies/ records that were reviewed today include:   Echo 05/12/20 demonstrated   1. Left ventricular ejection fraction, by estimation, is 60 to 65%. The left ventricle has normal function. The left ventricle has no regional wall motion abnormalities. Left ventricular diastolic parameters are indeterminate.   2. Right ventricular systolic function is normal. The right ventricular  size is normal. Tricuspid regurgitation signal is inadequate for assessing PA pressure.   3. The mitral valve is normal in structure. Trivial mitral valve  regurgitation. No evidence of mitral stenosis.   4. The aortic valve is tricuspid. Aortic valve regurgitation is not  visualized. No aortic stenosis is present.   5. The inferior vena cava is normal in size with greater than 50%  respiratory variability, suggesting right atrial pressure of 3 mmHg.   6. The patient is in atrial fibrillation.      Risk Assessment/Calculations:    CHA2DS2-VASc Score = 3   This  indicates a 3.2% annual risk of stroke. The patient's score is based upon: CHF History: 0 HTN History: 0 Diabetes History: 0 Stroke History: 2 (possible spenic embolic event) Vascular Disease History: 1 Age Score: 0 Gender Score: 0        ASSESSMENT:    1. PAF (paroxysmal atrial fibrillation) ([place])   2. Secondary hypercoagulable state ([place])   3. Stage 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 ([place])   4. Coronary artery calcification seen on CT scan      PLAN:  In order of problems listed above:  PAF status post TEE/DCCV 10/13/2020 back on amiodarone CHA2DS2-VASc score is 3 on Xarelto. BP running low so will decrease metoprolol 1/2 bid then change to Toprol xl 25 mg once daily  Secondary hypercoagulable state  Non-small lung CA followed by pulmonary and oncology, currently on Keytruda  Coronary calcification on CT-2 vessels asymptomatic. LDL 94 09/2020, Will try low dose lipitor 10 mg once daily. If RA worsens will need to try another drug or refer to lipid clinic  Shared Decision Making/Informed Consent        Medication Adjustments/Labs and Tests Ordered: Current medicines are reviewed at length with the patient today.  Concerns regarding medicines are outlined above.  Medication changes, Labs and Tests ordered today are listed in the Patient Instructions below. Patient Instructions  Medication Instructions:  Your physician has recommended you make the following change in your medication:   Take Metoprolol Tartrate 12.5mg  twice daily until you finish it. Then take Metoprolol Succinate 25mg  daily START: Atorvastatin (Lipitor) 10mg  daily  *If you need a refill on your cardiac medications before your next appointment, please call your pharmacy*   Lab Work: Your physician recommends that you return for a FASTING lipid profile and Liver function panel on 02/19/2021.  If you have labs (blood work) drawn today and your tests are completely normal, you will receive your  results only by: [place] (if you have MyChart) OR A paper copy in the mail If you have any lab test that is abnormal or we need to change your treatment, we will call you to review the results.   Follow-Up: At [facility]  you and your health needs are our priority.  As part of our continuing mission to provide you with exceptional heart care, we have created designated Provider Care Teams.  These Care Teams include your primary Cardiologist (physician) and Advanced Practice Providers (APPs -  Physician Assistants and Nurse Practitioners) who all work together to provide you with the care you need, when you need it.  We recommend signing up for the patient portal called "MyChart".  Sign up information is provided on this After Visit Summary.  MyChart is used to connect with patients for Virtual Visits (Telemedicine).  Patients are able to view lab/test results, encounter notes, upcoming appointments, etc.  Non-urgent messages can be sent to your provider as well.   To learn more about what you can do with MyChart, go to [email or web address].    Your next appointment:   6 month(s)  The format for your next appointment:   In Person  Provider:   You may see Fransico Him, MD or one of the following Advanced Practice Providers on your designated Care Team:   Melina Copa, PA-C Ermalinda Barrios, PA-C     Signed, Ermalinda Barrios, PA-C  11/19/2020 11:34 AM    [facility] [address], [place], [place]  [address] Phone: [phone]; Fax: [phone]

## 2020-11-13 ENCOUNTER — Other Ambulatory Visit: Payer: Self-pay

## 2020-11-13 ENCOUNTER — Telehealth: Payer: Self-pay | Admitting: Cardiology

## 2020-11-13 ENCOUNTER — Ambulatory Visit: Payer: 59 | Admitting: Hematology & Oncology

## 2020-11-13 ENCOUNTER — Ambulatory Visit: Payer: 59

## 2020-11-13 ENCOUNTER — Other Ambulatory Visit: Payer: 59

## 2020-11-13 DIAGNOSIS — I480 Paroxysmal atrial fibrillation: Secondary | ICD-10-CM

## 2020-11-13 MED ORDER — RIVAROXABAN 20 MG PO TABS
20.0000 mg | ORAL_TABLET | Freq: Every day | ORAL | 0 refills | Status: DC
Start: 2020-11-13 — End: 2021-01-01

## 2020-11-13 NOTE — Telephone Encounter (Signed)
Pt c/o medication issue:  1. Name of Medication: rivaroxaban (XARELTO) 20 MG TABS tablet  2. How are you currently taking this medication (dosage and times per day)? Take 1 tablet (20 mg total) by mouth daily with supper.  3. Are you having a reaction (difficulty breathing--STAT)? no  4. What is your medication issue? Pt needs a prior authorization for medication

## 2020-11-13 NOTE — Telephone Encounter (Signed)
Prescription refill request for Xarelto received.  Indication:Afib  Last office visit: 10/28/20 Marlene Lard, PA) Weight: 94.8kg Age: 60 Scr: 0.82 (10/14/20) CrCl: 110.55ml/min  Appropriate dose and refill sent to requested pharmacy.

## 2020-11-14 ENCOUNTER — Other Ambulatory Visit: Payer: 59

## 2020-11-14 ENCOUNTER — Ambulatory Visit: Payer: 59 | Admitting: Hematology & Oncology

## 2020-11-14 ENCOUNTER — Ambulatory Visit: Payer: 59

## 2020-11-14 NOTE — Telephone Encounter (Signed)
**Note De-Identified  Obfuscation** Xarelto PA started through covermymeds. Key: [id number]

## 2020-11-14 NOTE — Telephone Encounter (Signed)
**Note De-Identified  Obfuscation** Letter received from [place] stating that a PA is not required and that Xarelto is covered by the pts plan.

## 2020-11-19 ENCOUNTER — Encounter: Payer: Self-pay | Admitting: Physician Assistant

## 2020-11-19 ENCOUNTER — Ambulatory Visit (INDEPENDENT_AMBULATORY_CARE_PROVIDER_SITE_OTHER): Payer: 59 | Admitting: Physician Assistant

## 2020-11-19 ENCOUNTER — Other Ambulatory Visit: Payer: Self-pay

## 2020-11-19 VITALS — BP 100/60 | HR 79 | Ht 70.0 in | Wt 214.0 lb

## 2020-11-19 DIAGNOSIS — I480 Paroxysmal atrial fibrillation: Secondary | ICD-10-CM | POA: Diagnosis not present

## 2020-11-19 DIAGNOSIS — C3491 Malignant neoplasm of unspecified part of right bronchus or lung: Secondary | ICD-10-CM | POA: Diagnosis not present

## 2020-11-19 DIAGNOSIS — D6869 Other thrombophilia: Secondary | ICD-10-CM

## 2020-11-19 DIAGNOSIS — I2510 Atherosclerotic heart disease of native coronary artery without angina pectoris: Secondary | ICD-10-CM | POA: Diagnosis not present

## 2020-11-19 MED ORDER — METOPROLOL SUCCINATE ER 25 MG PO TB24: 25.0000 mg | ORAL_TABLET | Freq: Every day | ORAL | 3 refills | Status: DC

## 2020-11-19 MED ORDER — ATORVASTATIN CALCIUM 10 MG PO TABS: 10.0000 mg | ORAL_TABLET | Freq: Every day | ORAL | 3 refills | Status: DC

## 2020-11-19 NOTE — Patient Instructions (Addendum)
Medication Instructions:  Your physician has recommended you make the following change in your medication:   Take Metoprolol Tartrate 12.5mg  twice daily until you finish it. Then take Metoprolol Succinate 25mg  daily START: Atorvastatin (Lipitor) 10mg  daily  *If you need a refill on your cardiac medications before your next appointment, please call your pharmacy*   Lab Work: Your physician recommends that you return for a FASTING lipid profile and Liver function panel on 02/19/2021.  If you have labs (blood work) drawn today and your tests are completely normal, you will receive your results only by: [place] (if you have MyChart) OR A paper copy in the mail If you have any lab test that is abnormal or we need to change your treatment, we will call you to review the results.   Follow-Up: At [facility], you and your health needs are our priority.  As part of our continuing mission to provide you with exceptional heart care, we have created designated Provider Care Teams.  These Care Teams include your primary Cardiologist (physician) and Advanced Practice Providers (APPs -  Physician Assistants and Nurse Practitioners) who all work together to provide you with the care you need, when you need it.  We recommend signing up for the patient portal called "MyChart".  Sign up information is provided on this After Visit Summary.  MyChart is used to connect with patients for Virtual Visits (Telemedicine).  Patients are able to view lab/test results, encounter notes, upcoming appointments, etc.  Non-urgent messages can be sent to your provider as well.   To learn more about what you can do with MyChart, go to [email or web address].    Your next appointment:   6 month(s)  The format for your next appointment:   In Person  Provider:   You may see Fransico Him, MD or one of the following Advanced Practice Providers on your designated Care Team:   Melina Copa, PA-C Ermalinda Barrios, PA-C

## 2020-11-20 ENCOUNTER — Ambulatory Visit: Payer: 59 | Admitting: Hematology & Oncology

## 2020-11-20 ENCOUNTER — Other Ambulatory Visit: Payer: 59

## 2020-11-24 ENCOUNTER — Other Ambulatory Visit: Payer: Self-pay

## 2020-11-24 ENCOUNTER — Telehealth: Payer: Self-pay | Admitting: *Deleted

## 2020-11-24 ENCOUNTER — Encounter: Payer: Self-pay | Admitting: Hematology & Oncology

## 2020-11-24 ENCOUNTER — Inpatient Hospital Stay: Payer: 59

## 2020-11-24 ENCOUNTER — Inpatient Hospital Stay (HOSPITAL_BASED_OUTPATIENT_CLINIC_OR_DEPARTMENT_OTHER): Payer: 59 | Admitting: Hematology & Oncology

## 2020-11-24 ENCOUNTER — Inpatient Hospital Stay: Payer: 59 | Attending: Internal Medicine

## 2020-11-24 ENCOUNTER — Ambulatory Visit (HOSPITAL_COMMUNITY): Payer: 59 | Admitting: Physician Assistant

## 2020-11-24 VITALS — BP 95/62 | HR 73 | Temp 98.2°F | Resp 18 | Ht 70.0 in | Wt 212.0 lb

## 2020-11-24 DIAGNOSIS — C3491 Malignant neoplasm of unspecified part of right bronchus or lung: Secondary | ICD-10-CM

## 2020-11-24 DIAGNOSIS — C7970 Secondary malignant neoplasm of unspecified adrenal gland: Secondary | ICD-10-CM | POA: Insufficient documentation

## 2020-11-24 DIAGNOSIS — Z5112 Encounter for antineoplastic immunotherapy: Secondary | ICD-10-CM | POA: Insufficient documentation

## 2020-11-24 DIAGNOSIS — C3411 Malignant neoplasm of upper lobe, right bronchus or lung: Secondary | ICD-10-CM | POA: Insufficient documentation

## 2020-11-24 LAB — CMP (CANCER CENTER ONLY)
ALT: 21 U/L (ref 0–44)
AST: 28 U/L (ref 15–41)
Albumin: 3.4 g/dL — ABNORMAL LOW (ref 3.5–5.0)
Alkaline Phosphatase: 91 U/L (ref 38–126)
Anion gap: 6 (ref 5–15)
BUN: 14 mg/dL (ref 6–20)
CO2: 30 mmol/L (ref 22–32)
Calcium: 9.4 mg/dL (ref 8.9–10.3)
Chloride: 100 mmol/L (ref 98–111)
Creatinine: 1.03 mg/dL (ref 0.61–1.24)
GFR, Estimated: >60 mL/min (ref >60)
Glucose, Bld: 101 mg/dL — ABNORMAL HIGH (ref 70–99)
Potassium: 4 mmol/L (ref 3.5–5.1)
Sodium: 136 mmol/L (ref 135–145)
Total Bilirubin: 0.8 mg/dL (ref 0.3–1.2)
Total Protein: 6.9 g/dL (ref 6.5–8.1)

## 2020-11-24 LAB — CBC WITH DIFFERENTIAL (CANCER CENTER ONLY)
Abs Immature Granulocytes: 0 10*3/uL (ref 0.00–0.07)
Basophils Absolute: 0 10*3/uL (ref 0.0–0.1)
Basophils Relative: 1 %
Eosinophils Absolute: 0.1 10*3/uL (ref 0.0–0.5)
Eosinophils Relative: 6 %
HCT: 36 % — ABNORMAL LOW (ref 39.0–52.0)
Hemoglobin: 11.8 g/dL — ABNORMAL LOW (ref 13.0–17.0)
Immature Granulocytes: 0 %
Lymphocytes Relative: 55 %
Lymphs Abs: 1.3 10*3/uL (ref 0.7–4.0)
MCH: 29.6 pg (ref 26.0–34.0)
MCHC: 32.8 g/dL (ref 30.0–36.0)
MCV: 90.5 fL (ref 80.0–100.0)
Monocytes Absolute: 0.6 10*3/uL (ref 0.1–1.0)
Monocytes Relative: 23 %
Neutro Abs: 0.4 10*3/uL — CL (ref 1.7–7.7)
Neutrophils Relative %: 15 %
Platelet Count: 202 10*3/uL (ref 150–400)
RBC: 3.98 MIL/uL — ABNORMAL LOW (ref 4.22–5.81)
RDW: 16.3 % — ABNORMAL HIGH (ref 11.5–15.5)
WBC Count: 2.4 10*3/uL — ABNORMAL LOW (ref 4.0–10.5)
nRBC: 0 % (ref 0.0–0.2)

## 2020-11-24 LAB — SAVE SMEAR(SSMR), FOR PROVIDER SLIDE REVIEW

## 2020-11-24 LAB — LACTATE DEHYDROGENASE: LDH: 254 U/L — ABNORMAL HIGH (ref 98–192)

## 2020-11-24 MED ORDER — SODIUM CHLORIDE 0.9 % IV SOLN: Freq: Once | INTRAVENOUS | Status: AC

## 2020-11-24 MED ORDER — SODIUM CHLORIDE 0.9 % IV SOLN
200.0000 mg | Freq: Once | INTRAVENOUS | Status: AC
  Administered 2020-11-24: 200 mg via INTRAVENOUS
  Filled 2020-11-24: qty 8

## 2020-11-24 NOTE — Telephone Encounter (Signed)
Critical ANC reported at .4.  Dr [name] notified.  Seeing patient in exam room at present time.
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
## 2020-11-24 NOTE — Progress Notes (Signed)
Ok to treat despite labs

## 2020-11-24 NOTE — Patient Instructions (Signed)
[facility] [place]  Discharge Instructions: Thank you for choosing [facility] to provide your oncology and hematology care.   If you have a lab appointment with the [facility], please go directly to the [facility] and check in at the registration area.  Wear comfortable clothing and clothing appropriate for easy access to any Portacath or PICC line.   We strive to give you quality time with your provider. You may need to reschedule your appointment if you arrive late (15 or more minutes).  Arriving late affects you and other patients whose appointments are after yours.  Also, if you miss three or more appointments without notifying the office, you may be dismissed from the clinic at the provider's discretion.      For prescription refill requests, have your pharmacy contact our office and allow 72 hours for refills to be completed.    Today you received the following chemotherapy and/or immunotherapy agents: Keytruda      To help prevent nausea and vomiting after your treatment, we encourage you to take your nausea medication as directed.  BELOW ARE SYMPTOMS THAT SHOULD BE REPORTED IMMEDIATELY: *FEVER GREATER THAN 100.4 F (38 C) OR HIGHER *CHILLS OR SWEATING *NAUSEA AND VOMITING THAT IS NOT CONTROLLED WITH YOUR NAUSEA MEDICATION *UNUSUAL SHORTNESS OF BREATH *UNUSUAL BRUISING OR BLEEDING *URINARY PROBLEMS (pain or burning when urinating, or frequent urination) *BOWEL PROBLEMS (unusual diarrhea, constipation, pain near the anus) TENDERNESS IN MOUTH AND THROAT WITH OR WITHOUT PRESENCE OF ULCERS (sore throat, sores in mouth, or a toothache) UNUSUAL RASH, SWELLING OR PAIN  UNUSUAL VAGINAL DISCHARGE OR ITCHING   Items with * indicate a potential emergency and should be followed up as soon as possible or go to the Emergency Department if any problems should occur.  Please show the CHEMOTHERAPY ALERT CARD or IMMUNOTHERAPY ALERT CARD at check-in to the  Emergency Department and triage nurse. Should you have questions after your visit or need to cancel or reschedule your appointment, please contact [facility]  [phone] and follow the prompts.  Office hours are 8:00 a.m. to 4:30 p.m. Monday - Friday. Please note that voicemails left after 4:00 p.m. may not be returned until the following business day.  We are closed weekends and major holidays. You have access to a nurse at all times for urgent questions. Please call the main number to the clinic [phone] and follow the prompts.  For any non-urgent questions, you may also contact your provider using MyChart. We now offer e-Visits for anyone 9 and older to request care online for non-urgent symptoms. For details visit [email or web address].   Also download the MyChart app! Go to the app store, search "MyChart", open the app, select [facility], and log in with your MyChart username and password.  Due to Covid, a mask is required upon entering the hospital/clinic. If you do not have a mask, one will be given to you upon arrival. For doctor visits, patients may have 1 support person aged 52 or older with them. For treatment visits, patients cannot have anyone with them due to current Covid guidelines and our immunocompromised population.

## 2020-11-25 LAB — RHEUMATOID FACTOR: Rheumatoid fact SerPl-aCnc: >650 IU/mL — ABNORMAL HIGH (ref <14.0)

## 2020-12-15 ENCOUNTER — Other Ambulatory Visit: Payer: Self-pay

## 2020-12-15 ENCOUNTER — Telehealth: Payer: Self-pay | Admitting: *Deleted

## 2020-12-15 ENCOUNTER — Inpatient Hospital Stay: Payer: 59

## 2020-12-15 ENCOUNTER — Encounter: Payer: Self-pay | Admitting: Hematology & Oncology

## 2020-12-15 ENCOUNTER — Inpatient Hospital Stay (HOSPITAL_BASED_OUTPATIENT_CLINIC_OR_DEPARTMENT_OTHER): Payer: 59 | Admitting: Hematology & Oncology

## 2020-12-15 VITALS — BP 104/83 | HR 115 | Temp 98.7°F | Resp 18 | Wt 208.0 lb

## 2020-12-15 VITALS — HR 104

## 2020-12-15 DIAGNOSIS — C3491 Malignant neoplasm of unspecified part of right bronchus or lung: Secondary | ICD-10-CM

## 2020-12-15 DIAGNOSIS — Z5112 Encounter for antineoplastic immunotherapy: Secondary | ICD-10-CM | POA: Diagnosis not present

## 2020-12-15 LAB — CMP (CANCER CENTER ONLY)
ALT: 17 U/L (ref 0–44)
AST: 31 U/L (ref 15–41)
Albumin: 3.2 g/dL — ABNORMAL LOW (ref 3.5–5.0)
Alkaline Phosphatase: 98 U/L (ref 38–126)
Anion gap: 8 (ref 5–15)
BUN: 13 mg/dL (ref 6–20)
CO2: 28 mmol/L (ref 22–32)
Calcium: 9.4 mg/dL (ref 8.9–10.3)
Chloride: 99 mmol/L (ref 98–111)
Creatinine: 1.04 mg/dL (ref 0.61–1.24)
GFR, Estimated: >60 mL/min (ref >60)
Glucose, Bld: 101 mg/dL — ABNORMAL HIGH (ref 70–99)
Potassium: 3.8 mmol/L (ref 3.5–5.1)
Sodium: 135 mmol/L (ref 135–145)
Total Bilirubin: 1 mg/dL (ref 0.3–1.2)
Total Protein: 7.1 g/dL (ref 6.5–8.1)

## 2020-12-15 LAB — CBC WITH DIFFERENTIAL (CANCER CENTER ONLY)
Abs Immature Granulocytes: 0 10*3/uL (ref 0.00–0.07)
Basophils Absolute: 0 10*3/uL (ref 0.0–0.1)
Basophils Relative: 1 %
Eosinophils Absolute: 0.2 10*3/uL (ref 0.0–0.5)
Eosinophils Relative: 7 %
HCT: 37.9 % — ABNORMAL LOW (ref 39.0–52.0)
Hemoglobin: 12.7 g/dL — ABNORMAL LOW (ref 13.0–17.0)
Immature Granulocytes: 0 %
Lymphocytes Relative: 51 %
Lymphs Abs: 1.2 10*3/uL (ref 0.7–4.0)
MCH: 29.5 pg (ref 26.0–34.0)
MCHC: 33.5 g/dL (ref 30.0–36.0)
MCV: 88.1 fL (ref 80.0–100.0)
Monocytes Absolute: 0.6 10*3/uL (ref 0.1–1.0)
Monocytes Relative: 25 %
Neutro Abs: 0.4 10*3/uL — CL (ref 1.7–7.7)
Neutrophils Relative %: 16 %
Platelet Count: 240 10*3/uL (ref 150–400)
RBC: 4.3 MIL/uL (ref 4.22–5.81)
RDW: 15 % (ref 11.5–15.5)
WBC Count: 2.4 10*3/uL — ABNORMAL LOW (ref 4.0–10.5)
nRBC: 0 % (ref 0.0–0.2)

## 2020-12-15 LAB — SAVE SMEAR(SSMR), FOR PROVIDER SLIDE REVIEW

## 2020-12-15 LAB — LACTATE DEHYDROGENASE: LDH: 338 U/L — ABNORMAL HIGH (ref 98–192)

## 2020-12-15 MED ORDER — SODIUM CHLORIDE 0.9 % IV SOLN
200.0000 mg | Freq: Once | INTRAVENOUS | Status: AC
  Administered 2020-12-15: 200 mg via INTRAVENOUS
  Filled 2020-12-15: qty 8

## 2020-12-15 MED ORDER — SODIUM CHLORIDE 0.9 % IV SOLN: Freq: Once | INTRAVENOUS | Status: AC

## 2020-12-15 NOTE — Progress Notes (Signed)
Hematology and Oncology Follow Up Visit  John [name] [id number] 1960/09/01 60 y.o. 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  He had a little bit of a tough time over the weekend.  He is having some sweats.  He is having a bad cough.  He did not cough up anything.  He threw up this morning.  He had a change in his cardiac medications.  He is somewhat tachycardic today.  I think that he will have to see his cardiologist again and see if he had an adjustment can be made.  He is still on amiodarone.  This is for his atrial fibrillation.  He has had no problems with his thyroid.  Her last checked in August, the TSH was 2.9.  He has had no diarrhea.  He has had no rashes.  There is been no leg swelling.  Currently, he has had no chest pain.  There has been no bleeding.  Currently, I would say his performance status is probably ECOG 1.    Medications:  Allergies as of 12/15/2020       Reactions   Albuterol Other (See Comments)   Patient has had episode of atrial fib following administration of albuterol (tolerates Xopenex well)        Medication List        Accurate as of December 15, 2020  9:17 AM. If you have any questions, ask your nurse or doctor.          acetaminophen 500 MG tablet Commonly known as: TYLENOL Take 500 mg by mouth every 6 (six) hours as needed for mild pain or moderate pain.   amiodarone 200 MG tablet Commonly known as: PACERONE Take 1 tablet (200 mg total) by mouth daily. Start after finishing 2 weeks of amiodarone 200 mg twice daily   atorvastatin 10 MG tablet Commonly known as: LIPITOR Take 1 tablet (10 mg total) by mouth daily.   Keytruda 100 MG/4ML Soln Generic drug: pembrolizumab See admin instructions.    levalbuterol 45 MCG/ACT inhaler Commonly known as: Xopenex HFA Inhale 2 puffs into the lungs every 4 (four) hours as needed for wheezing.   metoprolol succinate 25 MG 24 hr tablet Commonly known as: Toprol XL Take 1 tablet (25 mg total) by mouth daily.   PARoxetine 10 MG tablet Commonly known as: PAXIL Take 10 mg by mouth daily.   predniSONE 5 MG tablet Commonly known as: DELTASONE Take 5 mg by mouth daily.   PriLOSEC OTC 20 MG tablet Generic drug: omeprazole Take 20 mg by mouth at bedtime.   rivaroxaban 20 MG Tabs tablet Commonly known as: XARELTO Take 1 tablet (20 mg total) by mouth daily with supper.        Allergies:  Allergies  Allergen Reactions   Albuterol Other (See Comments)    Patient has had episode of atrial fib following administration of albuterol (tolerates Xopenex well)    Past Medical History, Surgical history, Social history, and Family History were reviewed and updated.  Review of Systems: Review of Systems  Constitutional:  Positive for malaise/fatigue.  HENT: Negative.    Eyes: Negative.   Respiratory:  Positive for cough and shortness of breath.   Cardiovascular:  Positive for chest pain.  Gastrointestinal: Negative.   Genitourinary: Negative.   Musculoskeletal:  Positive for joint pain.  Skin: Negative.   Neurological:  Positive for focal weakness.  Endo/Heme/Allergies: Negative.   Psychiatric/Behavioral: Negative.      Physical Exam:  vitals were not taken for this visit.   Wt Readings from Last 3 Encounters:  11/24/20 212 lb (96.2 kg)  11/19/20 214 lb (97.1 kg)  10/30/20 209 lb (94.8 kg)    Physical Exam Vitals reviewed.  HENT:     Head: Normocephalic and atraumatic.  Eyes:     Pupils: Pupils are equal, round, and reactive to light.  Cardiovascular:     Rate and Rhythm: Normal rate and regular rhythm.     Heart sounds: Normal heart sounds.  Pulmonary:     Effort: Pulmonary effort is normal.     Breath sounds: Normal  breath sounds.  Abdominal:     General: Bowel sounds are normal.     Palpations: Abdomen is soft.  Musculoskeletal:        General: No tenderness or deformity. Normal range of motion.     Cervical back: Normal range of motion.  Lymphadenopathy:     Cervical: No cervical adenopathy.  Skin:    General: Skin is warm and dry.     Findings: No erythema or rash.  Neurological:     Mental Status: He is alert and oriented to person, place, and time.  Psychiatric:        Behavior: Behavior normal.        Thought Content: Thought content normal.        Judgment: Judgment normal.    Lab Results  Component Value Date   WBC 2.4 (L) 12/15/2020   HGB 12.7 (L) 12/15/2020   HCT 37.9 (L) 12/15/2020   MCV 88.1 12/15/2020   PLT 240 12/15/2020   No results found for: FERRITIN, IRON, TIBC, UIBC, IRONPCTSAT Lab Results  Component Value Date   RBC 4.30 12/15/2020   No results found for: KPAFRELGTCHN, LAMBDASER, KAPLAMBRATIO No results found for: IGGSERUM, IGA, IGMSERUM No results found for: Odetta Pink, SPEI   Chemistry      Component Value Date/Time   NA 136 11/24/2020 0831   K 4.0 11/24/2020 0831   CL 100 11/24/2020 0831   CO2 30 11/24/2020 0831   BUN 14 11/24/2020 0831   CREATININE 1.03 11/24/2020 0831      Component Value Date/Time   CALCIUM 9.4 11/24/2020 0831   ALKPHOS 91 11/24/2020 0831   AST 28 11/24/2020 0831   ALT 21 11/24/2020 0831   BILITOT 0.8 11/24/2020 0831       Impression and Plan: John [name] is a very pleasant 60 yo gentleman with metastatic squamous cell carcinoma of the right lung. Tumor had high PD-L1 score.   We will go ahead with his 11th cycle of treatment.  We we will set him up with his CT scans after this cycle.  I will do them in 2 weeks.  I am glad his white cell count is holding steady.  This is certainly encouraging.  He certainly does not look sick.  I am not sure why the hot flashes and  sweats.  Again, his heart rate is on the high side.  I really do think that his cardiologist wanted to make another adjustment with his medications.  Will plan to get him back in 3 weeks.  If we find that his cancer is progressing, we will have  to go back to chemotherapy.    John Napoleon, MD 10/31/20229:17 AM

## 2020-12-15 NOTE — Telephone Encounter (Signed)
ANC reported by Richardson Landry in lab as .4.  Dr [name] notified.  Seeing patient in office today

## 2020-12-15 NOTE — Progress Notes (Signed)
Ok to treat with ANC of 0.4 & HR of 104 per Dr [name]. dph

## 2020-12-15 NOTE — Telephone Encounter (Signed)
The patient stated he started feeling bad ~3-4 days ago. Patient has been having fast heart rate, shortness of breath and states he will sweat a lot when his heart is high. Patient wanted to note that he used his inhaler on Saturday to try and help with the shortness of breath but it just made things worse for him. Stating he coughed so much it made him sick. When at oncologist this am HR was 115, they had him delay treatment until it was close to 100. Patient states it was 104 when they started. (Notes in epic with readings.) 146/77 87 current. No missed doses of medication. No lifestyle changes. Doesn't drink as much water as he should and will try to stay hydrated better. Discussed avoiding triggers such as alcohol, caffeine, stress, and dehydration.  Will forward for advisement.

## 2020-12-15 NOTE — Patient Instructions (Signed)
[facility]  Discharge Instructions: Thank you for choosing [facility] to provide your oncology and hematology care.   If you have a lab appointment with the [facility], please go directly to the [facility] and check in at the registration area.  Wear comfortable clothing and clothing appropriate for easy access to any Portacath or PICC line.   We strive to give you quality time with your provider. You may need to reschedule your appointment if you arrive late (15 or more minutes).  Arriving late affects you and other patients whose appointments are after yours.  Also, if you miss three or more appointments without notifying the office, you may be dismissed from the clinic at the provider's discretion.      For prescription refill requests, have your pharmacy contact our office and allow 72 hours for refills to be completed.    Today you received the following chemotherapy and/or immunotherapy agents Keytruda      To help prevent nausea and vomiting after your treatment, we encourage you to take your nausea medication as directed.  BELOW ARE SYMPTOMS THAT SHOULD BE REPORTED IMMEDIATELY: *FEVER GREATER THAN 100.4 F (38 C) OR HIGHER *CHILLS OR SWEATING *NAUSEA AND VOMITING THAT IS NOT CONTROLLED WITH YOUR NAUSEA MEDICATION *UNUSUAL SHORTNESS OF BREATH *UNUSUAL BRUISING OR BLEEDING *URINARY PROBLEMS (pain or burning when urinating, or frequent urination) *BOWEL PROBLEMS (unusual diarrhea, constipation, pain near the anus) TENDERNESS IN MOUTH AND THROAT WITH OR WITHOUT PRESENCE OF ULCERS (sore throat, sores in mouth, or a toothache) UNUSUAL RASH, SWELLING OR PAIN  UNUSUAL VAGINAL DISCHARGE OR ITCHING   Items with * indicate a potential emergency and should be followed up as soon as possible or go to the Emergency Department if any problems should occur.  Please show the CHEMOTHERAPY ALERT CARD or IMMUNOTHERAPY ALERT CARD at check-in to the  Emergency Department and triage nurse. Should you have questions after your visit or need to cancel or reschedule your appointment, please contact [facility]  [phone] and follow the prompts.  Office hours are 8:00 a.m. to 4:30 p.m. Monday - Friday. Please note that voicemails left after 4:00 p.m. may not be returned until the following business day.  We are closed weekends and major holidays. You have access to a nurse at all times for urgent questions. Please call the main number to the clinic [phone] and follow the prompts.  For any non-urgent questions, you may also contact your provider using MyChart. We now offer e-Visits for anyone 74 and older to request care online for non-urgent symptoms. For details visit [email or web address].   Also download the MyChart app! Go to the app store, search "MyChart", open the app, select [facility], and log in with your MyChart username and password.  Due to Covid, a mask is required upon entering the hospital/clinic. If you do not have a mask, one will be given to you upon arrival. For doctor visits, patients may have 1 support person aged 50 or older with them. For treatment visits, patients cannot have anyone with them due to current Covid guidelines and our immunocompromised population.

## 2020-12-16 ENCOUNTER — Ambulatory Visit (HOSPITAL_COMMUNITY)
Admission: RE | Admit: 2020-12-16 | Discharge: 2020-12-16 | Disposition: A | Discharge status: Home / Self Care | Payer: 59 | Source: Ambulatory Visit | Attending: Physician Assistant | Admitting: Physician Assistant

## 2020-12-16 VITALS — BP 100/70 | HR 102 | Ht 70.0 in | Wt 207.2 lb

## 2020-12-16 DIAGNOSIS — Z7901 Long term (current) use of anticoagulants: Secondary | ICD-10-CM | POA: Insufficient documentation

## 2020-12-16 DIAGNOSIS — I4819 Other persistent atrial fibrillation: Secondary | ICD-10-CM | POA: Insufficient documentation

## 2020-12-16 DIAGNOSIS — Z79899 Other long term (current) drug therapy: Secondary | ICD-10-CM | POA: Diagnosis not present

## 2020-12-16 DIAGNOSIS — C3490 Malignant neoplasm of unspecified part of unspecified bronchus or lung: Secondary | ICD-10-CM | POA: Diagnosis not present

## 2020-12-16 DIAGNOSIS — Z7952 Long term (current) use of systemic steroids: Secondary | ICD-10-CM | POA: Insufficient documentation

## 2020-12-16 DIAGNOSIS — M069 Rheumatoid arthritis, unspecified: Secondary | ICD-10-CM | POA: Diagnosis not present

## 2020-12-16 DIAGNOSIS — D6869 Other thrombophilia: Secondary | ICD-10-CM | POA: Insufficient documentation

## 2020-12-16 DIAGNOSIS — R051 Acute cough: Secondary | ICD-10-CM | POA: Insufficient documentation

## 2020-12-16 DIAGNOSIS — Z79811 Long term (current) use of aromatase inhibitors: Secondary | ICD-10-CM | POA: Diagnosis not present

## 2020-12-16 DIAGNOSIS — Z87891 Personal history of nicotine dependence: Secondary | ICD-10-CM | POA: Diagnosis not present

## 2020-12-16 MED ORDER — AMIODARONE HCL 200 MG PO TABS: 200.0000 mg | ORAL_TABLET | Freq: Every day | ORAL | Status: DC

## 2020-12-16 NOTE — Progress Notes (Signed)
Primary Care Physician: [name], MD Primary Cardiologist: Dr Radford Pax Primary Electrophysiologist: none Referring Physician: Dr Lenny Pastel John [name] is a 60 y.o. male with a history of stage IIIb/IV lung cancer, RA, and atrial fibrillation who presents for follow up in the [facility].  The patient was initially diagnosed with atrial fibrillation 04/2020 after presenting to the ED with generalized weakness. He had been started on systemic chemotherapy recently PTA.  He was noted to be tachycardic in rapid atrial fibrillation.  He was hospitalized for further management of acute C diff diarrhea, sepsis and febrile neutropenia. He was started on amiodarone which converted him to SR. Anticoagulation was not started given elevated bleeding risk with tumor pressing on pulmonary artery. Patient has a CHADS2VASC score of 3. Patient has not had any recurrence of heart racing and feels "great". There was concern about lung toxicity with either amiodarone or Keytruda on CT 07/18/20 and his amiodarone was discontinued.   Patient was hospitalized again 09/2020 with neutropenic fever with pseudomonal bacteremia/sepsis. During the admission, he developed recurrent afib. After d/w pulmonology, amiodarone was resumed and he underwent DCCV on 10/13/20.   On follow up today, patient has noted an increase in SOB and a cough for the last several days. He was seen at hematology/oncology for a visit yesterday and his heart rates were noted to be elevated ~100-115 bpm. At home his rates have been 80s- low 100s. He denies fever or chills but has vomited 2/2 coughing forcefully.   Today, he denies symptoms of palpitations, chest pain, orthopnea, PND, lower extremity edema, dizziness, presyncope, syncope, snoring, daytime somnolence, bleeding, or neurologic sequela. The patient is tolerating medications without difficulties and is otherwise without complaint today.    Atrial Fibrillation  Risk Factors:  he does not have symptoms or diagnosis of sleep apnea. he does not have a history of rheumatic fever. The patient does have a history of early familial atrial fibrillation or other arrhythmias. Mother has afib.  he has a BMI of Body mass index is 29.73 kg/m.[name] Filed Weights   12/16/20 0906  Weight: 94 kg     Family History  Problem Relation Age of Onset   Arrhythmia Mother        has PPM   Heart disease Father        Died at 67, started in his 15s, heart attacks, had PPM and ICD     Atrial Fibrillation Management history:  Previous antiarrhythmic drugs: amiodarone  Previous cardioversions: 10/13/20 Previous ablations: none CHADS2VASC score: 3 Anticoagulation history: Xarelto    Past Medical History:  Diagnosis Date   Dyspnea    Family history of adverse reaction to anesthesia    brother with seizures had episode under anesthesia.  Patient has seizures as well.   GERD (gastroesophageal reflux disease)    History of radiation therapy 04/24/2020-05/16/2020   IMRT to right lung     Dr [name]   PAF (paroxysmal atrial fibrillation) ([place])    CHADS2VSAC score 0   Rheumatoid aortitis    Squamous cell lung cancer [facility])    Past Surgical History:  Procedure Laterality Date   BRONCHIAL BRUSHINGS  03/27/2020   Procedure: BRONCHIAL BRUSHINGS;  Surgeon: [name], MD;  Location: [facility];  Service: Cardiopulmonary;;   BUBBLE STUDY  10/13/2020   Procedure: BUBBLE STUDY;  Surgeon: Pixie Casino, MD;  Location: [facility];  Service: Cardiovascular;;   CARDIOVERSION N/A 10/13/2020   Procedure: CARDIOVERSION;  Surgeon: Pixie Casino, MD;  Location: [facility] END[facility]COPY;  Service: Cardiovascular;  Laterality: N/A;   CATARACT EXTRACTION  2016   at [facility]  03/27/2020   Procedure: FINE NEEDLE ASPIRATION;  Surgeon: [name], MD;  Location: [facility];  Service: Cardiopulmonary;;   HIP SURGERY Left    TEE WITHOUT CARDIOVERSION N/A  10/13/2020   Procedure: TRANSESOPHAGEAL ECHOCARDIOGRAM (TEE);  Surgeon: Pixie Casino, MD;  Location: [facility];  Service: Cardiovascular;  Laterality: N/A;   VIDEO BRONCHOSCOPY WITH ENDOBRONCHIAL ULTRASOUND N/A 03/27/2020   Procedure: VIDEO BRONCHOSCOPY WITH ENDOBRONCHIAL ULTRASOUND;  Surgeon: [name], MD;  Location: [facility];  Service: Cardiopulmonary;  Laterality: N/A;    Current Outpatient Medications  Medication Sig Dispense Refill   acetaminophen (TYLENOL) 500 MG tablet Take 500 mg by mouth every 6 (six) hours as needed for mild pain or moderate pain.     levalbuterol (XOPENEX HFA) 45 MCG/ACT inhaler Inhale 2 puffs into the lungs every 4 (four) hours as needed for wheezing. 1 each 12   metoprolol succinate (TOPROL XL) 25 MG 24 hr tablet Take 1 tablet (25 mg total) by mouth daily. 90 tablet 3   PARoxetine (PAXIL) 10 MG tablet Take 10 mg by mouth daily.     pembrolizumab (KEYTRUDA) 100 MG/4ML SOLN Inject into the vein every 21 ( twenty-one) days.     predniSONE (DELTASONE) 5 MG tablet Take 5 mg by mouth daily.     PRILOSEC OTC 20 MG tablet Take 20 mg by mouth at bedtime.     rivaroxaban (XARELTO) 20 MG TABS tablet Take 1 tablet (20 mg total) by mouth daily with supper. 90 tablet 0   amiodarone (PACERONE) 200 MG tablet Take 1 tablet (200 mg total) by mouth daily.     atorvastatin (LIPITOR) 10 MG tablet Take 1 tablet (10 mg total) by mouth daily. (Patient not taking: Reported on 12/16/2020) 90 tablet 3   No current facility-administered medications for this encounter.    Allergies  Allergen Reactions   Albuterol Other (See Comments)    Patient has had episode of atrial fib following administration of albuterol (tolerates Xopenex well)    Social History   Socioeconomic History   Marital status: Married    Spouse name: Not on file   Number of children: Not on file   Years of education: Not on file   Highest education level: Not on file  Occupational History   Not  on file  Tobacco Use   Smoking status: Former    Packs/day: 1.00    Years: 30.00    Pack years: 30.00    Types: Cigarettes    Quit date: 2018    Years since quitting: 4.8   Smokeless tobacco: Never   Tobacco comments:    Former smoker 10/28/2020  Vaping Use   Vaping Use: Never used  Substance and Sexual Activity   Alcohol use: Not Currently   Drug use: Never   Sexual activity: Not on file  Other Topics Concern   Not on file  Social History Narrative   ** Merged History Encounter **       Social Determinants of Health   Financial Resource Strain: Not on file  Food Insecurity: Not on file  Transportation Needs: Not on file  Physical Activity: Not on file  Stress: Not on file  Social Connections: Not on file  Intimate Partner Violence: Not on file     ROS- All systems are reviewed  and negative except as per the HPI above.  Physical Exam: Vitals:   12/16/20 0906  BP: 100/70  Pulse: (!) 102  Weight: 94 kg  Height: 5\' 10"  (1.778 m)    GEN- The patient is a well appearing male, alert and oriented x 3 today.   HEENT-head normocephalic, atraumatic, sclera clear, conjunctiva pink, hearing intact, trachea midline. Lungs- RLL diminished breath sounds and fine crackles, normal work of breathing Heart- Regular rate and rhythm, no murmurs, rubs or gallops  GI- soft, NT, ND, + BS Extremities- no clubbing, cyanosis, or edema MS- no significant deformity or atrophy Skin- no rash or lesion Psych- euthymic mood, full affect Neuro- strength and sensation are intact   Wt Readings from Last 3 Encounters:  12/16/20 94 kg  12/15/20 94.3 kg  11/24/20 96.2 kg    EKG today demonstrates  Sinus tachycardia Vent. rate 102 BPM PR interval 144 ms QRS duration 90 ms QT/QTcB 366/477 ms  Echo 05/12/20 demonstrated   1. Left ventricular ejection fraction, by estimation, is 60 to 65%. The left ventricle has normal function. The left ventricle has no regional wall motion  abnormalities. Left ventricular diastolic parameters are indeterminate.   2. Right ventricular systolic function is normal. The right ventricular  size is normal. Tricuspid regurgitation signal is inadequate for assessing PA pressure.   3. The mitral valve is normal in structure. Trivial mitral valve  regurgitation. No evidence of mitral stenosis.   4. The aortic valve is tricuspid. Aortic valve regurgitation is not  visualized. No aortic stenosis is present.   5. The inferior vena cava is normal in size with greater than 50%  respiratory variability, suggesting right atrial pressure of 3 mmHg.   6. The patient is in atrial fibrillation.   Epic records are reviewed at length today  CHA2DS2-VASc Score = 3  The patient's score is based upon: CHF History: 0 HTN History: 0 Diabetes History: 0 Stroke History: 2 (possible spenic embolic event) Vascular Disease History: 1 Age Score: 0 Gender Score: 0       ASSESSMENT AND PLAN: 1. Persistent Atrial Fibrillation (ICD10:  I48.19) The patient's CHA2DS2-VASc score is 3, indicating a 3.2% annual risk of stroke.   Patient in [place]. ? If faster heart rates are related to underlying infectious process given coughing and fine crackles in base of R lung. Patient agrees to call PCP today for evaluation. Will not adjust rate control at this time.  Continue amiodarone 200 mg daily Continue Xarelto 20 mg daily Continue Toprol 25 mg daily   2. Secondary Hypercoagulable State (ICD10:  D68.69) The patient is at significant risk for stroke/thromboembolism based upon his CHA2DS2-VASc Score of 3.  Continue Rivaroxaban (Xarelto).   3. Non-small cell lung cancer Plans per pulmonology and oncology.   Follow up closely with PCP. AF clinic in 04/2021 as scheduled.    [facility] [address] [place], [place] [address] [phone] 12/16/2020 11:01 AM

## 2020-12-16 NOTE — Telephone Encounter (Signed)
Patient seen in a fib clinic this morning

## 2020-12-18 ENCOUNTER — Telehealth: Payer: Self-pay | Admitting: Pulmonary Disease

## 2020-12-18 ENCOUNTER — Other Ambulatory Visit: Payer: Self-pay

## 2020-12-18 ENCOUNTER — Ambulatory Visit: Payer: 59 | Admitting: Pulmonary Disease

## 2020-12-18 ENCOUNTER — Encounter: Payer: Self-pay | Admitting: Pulmonary Disease

## 2020-12-18 ENCOUNTER — Ambulatory Visit (INDEPENDENT_AMBULATORY_CARE_PROVIDER_SITE_OTHER): Payer: 59

## 2020-12-18 VITALS — BP 90/62 | HR 82 | Temp 98.2°F | Ht 70.0 in | Wt 206.8 lb

## 2020-12-18 DIAGNOSIS — J449 Chronic obstructive pulmonary disease, unspecified: Secondary | ICD-10-CM

## 2020-12-18 DIAGNOSIS — R0602 Shortness of breath: Secondary | ICD-10-CM

## 2020-12-18 DIAGNOSIS — I4819 Other persistent atrial fibrillation: Secondary | ICD-10-CM

## 2020-12-18 DIAGNOSIS — R9389 Abnormal findings on diagnostic imaging of other specified body structures: Secondary | ICD-10-CM | POA: Insufficient documentation

## 2020-12-18 DIAGNOSIS — C3491 Malignant neoplasm of unspecified part of right bronchus or lung: Secondary | ICD-10-CM

## 2020-12-18 HISTORY — DX: Shortness of breath: R06.02

## 2020-12-18 MED ORDER — STIOLTO RESPIMAT 2.5-2.5 MCG/ACT IN AERS: 2.0000 | INHALATION_SPRAY | Freq: Every day | RESPIRATORY_TRACT | 0 refills | Status: DC

## 2020-12-18 MED ORDER — PREDNISONE 10 MG PO TABS: ORAL_TABLET | ORAL | 0 refills | Status: DC

## 2020-12-18 MED ORDER — AMOXICILLIN-POT CLAVULANATE 875-125 MG PO TABS: 1.0000 | ORAL_TABLET | Freq: Two times a day (BID) | ORAL | 0 refills | Status: DC

## 2020-12-18 NOTE — Progress Notes (Signed)
'@Patient'  ID: John [name], male    DOB: 04-Jun-1960, 61 y.o.   MRN: [id number]  No chief complaint on file.   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  PMH: A. fib, history of C. difficile, PAF, rheumatoid arthritis Smoker/ Smoking History: Former smoker Maintenance: None Pt of: Dr. 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  Patient is followed in our office by Dr. Lamonte [name].  Last seen in July/2022.  At that time patient was status post bronchoscopy he was diagnosed with stage IV squamous cell lung cancer with right upper lobe endobronchial disease and adrenal mass.  He had mild obstruction on pulmonary function testing.  He was currently receiving Keytruda and planning on a repeat CT in the next few months. Plan as of July/2022 office visit with Dr. Lamonte [name] was to attempt to use the Xopenex to help with management of shortness of breath for his COPD.  If no improvement need to consider scheduled bronchodilators.  Patient was status post chemoradiation and is now maintained on Keytruda. Patient completed follow-up with oncology John [name] on 12/15/2020.  Excerpt of that impression and plan as listed below:  Impression and Plan: John [name] is a very pleasant 60 yo gentleman with metastatic squamous cell carcinoma of the right lung. Tumor had high PD-L1 score.    We will go ahead with his 11th cycle of treatment.  We we will set him up with his CT scans after this cycle.  I will do them in 2 weeks.  I am glad his white cell count is holding steady.  This is certainly encouraging.  He certainly does not look sick.  I am not sure why the hot flashes and sweats.  Again, his heart rate is on the high side.  I really do think that his cardiologist wanted to make another adjustment with his  medications.  Will plan to get him back in 3 weeks.  If we find that his cancer is progressing, we will have to go back to chemotherapy.    Patient presenting to office today reporting that he started coughing on Friday 12/12/2020.  He felt feverish and had chills over the weekend.  He did not have a recorded fever at this time.  He reported the symptoms improved on 12/14/2020.  Patient reports he still does not use Xopenex regularly.  He reported that he used it 1 time on 12/13/2020.  He did not feel that this helped.  He also feels that this may have led to him throwing up.  Patient continues to have a productive cough.  Sputum now is white.  Sputum over the weekend was tan to yellow.  Patient reports a stable appetite although he feels that he has lost some weight with this current illness.  Patient remains on amiodarone at this time.  Last seen by cardiology on 12/16/2020.  Patient remains adherent to Keytruda infusions as well.  Patient has planned CT of chest/abdomen later on this month.  Patient reporting that he feels more short of breath.  Walk today in office patient required 3 L with physical exertion to maintain oxygen saturations above 88%.  Patient dropped to 85% on room air with 1 lap in office.  Chest x-ray done in office today shows concerns for potential pneumonia or progression of  disease.  We will discuss this today.  12/18/2020-chest x-ray-new streaky airspace densities in the left lung concerning for multifocal pneumonia versus lung cancer recurrence, persistent cavitation in the right midlung, some improvement in airspace density about the cavitation   Tests:   07/18/2020-CT chest with contrast-mild centrilobular and paraseptal emphysema with diffuse bronchial wall thickening, solid peripheral right upper lobe 1.1 cm pulmonary nodule stable, new region fairly sharply marginated patchy consolidation and groundglass opacity throughout the perihilar right midlung compatible with  early postradiation change encompassing cavitary posterior right upper lobe lung mass which measures 5 x 3.2 cm decreased from 7.1 x 4.5 cm in which is increasingly cavitary.  No new significant pulmonary nodules.  New mild to moderate patchy subpleural reticulation and groundglass opacity throughout both lungs  10/06/2020-CT chest with contrast-increasingly conspicuous lymph nodes in the right hilar mediastinal, lower right cervical and supra clavicle we are nodular stations concerning for potential metastatic disease although some of this could be simply reactive in the setting of evolving postradiation changes in the right lung, satellite nodule measuring 11 mm in right upper lobe, stable, right adrenal metastasis similar to prior study  06/11/2020-pulmonary function test-FVC 4.68) 93% predicted), postbronchodilator ratio 72, postbronchodilator FEV1 3.51 (92% predicted), DLCO 18.23 (63% predicted)  FENO:  No results found for: NITRICOXIDE  PFT: PFT Results Latest Ref Rng & Units 06/11/2020  FVC-Pre L 4.68  FVC-Predicted Pre % 93  FVC-Post L 4.87  FVC-Predicted Post % 97  Pre FEV1/FVC % % 65  Post FEV1/FCV % % 72  FEV1-Pre L 3.05  FEV1-Predicted Pre % 80  FEV1-Post L 3.51  DLCO uncorrected ml/min/mmHg 18.23  DLCO UNC% % 63  DLCO corrected ml/min/mmHg 22.14  DLCO COR %Predicted % 76  DLVA Predicted % 79  TLC L 7.53  TLC % Predicted % 104  RV % Predicted % 127    WALK:  No flowsheet data found.  Imaging: DG Chest 2 View  Result Date: 12/18/2020 CLINICAL DATA:  Stage IV lung cancer. EXAM: CHEST - 2 VIEW COMPARISON:  Radiograph 10/10/2020, CT 822 22 FINDINGS: Normal cardiac silhouette. New streaky airspace consolidation in LEFT lung. Similar dense consolidative pattern in the RIGHT lung with central cavitation measuring 5.5 cm. There is some improvement density within the RIGHT lung about the cavitation. low lung volumes. No acute osseous abnormality. IMPRESSION: 1. New streaky  airspace densities in the LEFT lung concerning for multifocal pneumonia versus lung cancer recurrence. 2. Persistent cavitation in the RIGHT mid lung. Some improvement in airspace density about the cavitation. These results will be called to the ordering clinician or representative by the Radiologist Assistant, and communication documented in the PACS or [employer]. Electronically Signed   By: Suzy Bouchard M.D.   On: 12/18/2020 12:07    Lab Results:  CBC    Component Value Date/Time   WBC 2.4 (L) 12/15/2020 0847   WBC 1.4 (LL) 10/14/2020 0502   RBC 4.30 12/15/2020 0847   HGB 12.7 (L) 12/15/2020 0847   HCT 37.9 (L) 12/15/2020 0847   PLT 240 12/15/2020 0847   MCV 88.1 12/15/2020 0847   MCH 29.5 12/15/2020 0847   MCHC 33.5 12/15/2020 0847   RDW 15.0 12/15/2020 0847   LYMPHSABS 1.2 12/15/2020 0847   MONOABS 0.6 12/15/2020 0847   EOSABS 0.2 12/15/2020 0847   BASOSABS 0.0 12/15/2020 0847    BMET    Component Value Date/Time   NA 135 12/15/2020 0847   K 3.8 12/15/2020 0847   CL  99 12/15/2020 0847   CO2 28 12/15/2020 0847   GLUCOSE 101 (H) 12/15/2020 0847   BUN 13 12/15/2020 0847   CREATININE 1.04 12/15/2020 0847   CALCIUM 9.4 12/15/2020 0847   GFRNONAA >60 12/15/2020 0847    BNP No results found for: BNP  ProBNP No results found for: PROBNP  Specialty Problems       Pulmonary Problems   COPD with asthma ([place])   Mass of right lung   Stage 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 (HCC)   Malignant neoplasm of lung (HCC)   Shortness of breath    Allergies  Allergen Reactions   Albuterol Other (See Comments)    Patient has had episode of atrial fib following administration of albuterol (tolerates Xopenex well)    Immunization History  Administered Date(s) Administered   PFIZER Comirnaty([place])Covid-19 Tri-Sucrose Vaccine 05/10/2019, 06/04/2019, 12/31/2019    Past Medical History:  Diagnosis Date   Dyspnea    Family history of adverse reaction to  anesthesia    brother with seizures had episode under anesthesia.  Patient has seizures as well.   GERD (gastroesophageal reflux disease)    History of radiation therapy 04/24/2020-05/16/2020   IMRT to right lung     Dr [name]   PAF (paroxysmal atrial fibrillation) ([place])    CHADS2VSAC score 0   Rheumatoid aortitis    Squamous cell lung cancer ([place])     Tobacco History: Social History   Tobacco Use  Smoking Status Former   Packs/day: 1.00   Years: 30.00   Pack years: 30.00   Types: Cigarettes   Quit date: 2018   Years since quitting: 4.8  Smokeless Tobacco Never  Tobacco Comments   Former smoker 10/28/2020   Counseling given: Not Answered Tobacco comments: Former smoker 10/28/2020   Continue to not smoke  Outpatient Encounter Medications as of 12/18/2020  Medication Sig   acetaminophen (TYLENOL) 500 MG tablet Take 500 mg by mouth every 6 (six) hours as needed for mild pain or moderate pain.   amiodarone (PACERONE) 200 MG tablet Take 1 tablet (200 mg total) by mouth daily.   amoxicillin-clavulanate (AUGMENTIN) 875-125 MG tablet Take 1 tablet by mouth 2 (two) times daily.   atorvastatin (LIPITOR) 10 MG tablet Take 1 tablet (10 mg total) by mouth daily.   levalbuterol (XOPENEX HFA) 45 MCG/ACT inhaler Inhale 2 puffs into the lungs every 4 (four) hours as needed for wheezing.   metoprolol succinate (TOPROL XL) 25 MG 24 hr tablet Take 1 tablet (25 mg total) by mouth daily.   PARoxetine (PAXIL) 10 MG tablet Take 10 mg by mouth daily.   pembrolizumab (KEYTRUDA) 100 MG/4ML SOLN Inject into the vein every 21 ( twenty-one) days.   predniSONE (DELTASONE) 10 MG tablet Take 4 tablets (40 mg total) by mouth daily with breakfast for 3 days, THEN 3 tablets (30 mg total) daily with breakfast for 3 days, THEN 2 tablets (20 mg total) daily with breakfast for 3 days, THEN 1 tablet (10 mg total) daily with breakfast for 3 days.   predniSONE (DELTASONE) 5 MG tablet Take 5 mg by mouth daily.    PRILOSEC OTC 20 MG tablet Take 20 mg by mouth at bedtime.   rivaroxaban (XARELTO) 20 MG TABS tablet Take 1 tablet (20 mg total) by mouth daily with supper.   Tiotropium Bromide-Olodaterol (STIOLTO RESPIMAT) 2.5-2.5 MCG/ACT AERS Inhale 2 puffs into the lungs daily.   No facility-administered encounter medications on file as of 12/18/2020.  Review of Systems  Review of Systems  Constitutional:  Positive for fatigue. Negative for activity change, chills, fever and unexpected weight change.  HENT:  Negative for postnasal drip, rhinorrhea, sinus pressure, sinus pain and sore throat.   Eyes: Negative.   Respiratory:  Positive for cough and shortness of breath. Negative for wheezing.   Cardiovascular:  Positive for palpitations (followed by cardiology). Negative for chest pain.  Gastrointestinal:  Negative for constipation, diarrhea, nausea and vomiting.  Endocrine: Negative.   Genitourinary: Negative.   Musculoskeletal: Negative.   Skin: Negative.   Neurological:  Negative for dizziness and headaches.  Psychiatric/Behavioral: Negative.  Negative for dysphoric mood. The patient is not nervous/anxious.   All other systems reviewed and are negative.   Physical Exam  BP 90/62 (BP Location: Left Arm, Patient Position: Sitting, Cuff Size: Normal)   Pulse 82   Temp 98.2 F (36.8 C) (Oral)   Ht '5\' 10"'  (1.778 m)   Wt 206 lb 12.8 oz (93.8 kg)   SpO2 99%   BMI 29.67 kg/m   Wt Readings from Last 5 Encounters:  12/18/20 206 lb 12.8 oz (93.8 kg)  12/16/20 207 lb 3.2 oz (94 kg)  12/15/20 208 lb (94.3 kg)  11/24/20 212 lb (96.2 kg)  11/19/20 214 lb (97.1 kg)    BMI Readings from Last 5 Encounters:  12/18/20 29.67 kg/m  12/16/20 29.73 kg/m  12/15/20 29.84 kg/m  11/24/20 30.42 kg/m  11/19/20 30.71 kg/m     Physical Exam Vitals and nursing note reviewed.  Constitutional:      General: He is not in acute distress.    Appearance: Normal appearance. He is obese.  HENT:      Head: Normocephalic and atraumatic.     Right Ear: Hearing and external ear normal.     Left Ear: Hearing and external ear normal.     Nose: Rhinorrhea present. No mucosal edema.     Right Turbinates: Not enlarged.     Left Turbinates: Not enlarged.     Mouth/Throat:     Mouth: Mucous membranes are dry.     Pharynx: Oropharynx is clear. No oropharyngeal exudate.  Eyes:     Pupils: Pupils are equal, round, and reactive to light.  Cardiovascular:     Rate and Rhythm: Normal rate and regular rhythm.     Pulses: Normal pulses.     Heart sounds: Normal heart sounds. No murmur heard. Pulmonary:     Effort: Pulmonary effort is normal.     Breath sounds: Examination of the left-lower field reveals rhonchi. Rhonchi present. No decreased breath sounds, wheezing or rales.     Comments: Noisy chest, prior to having patient cough significant cry.  After having patient cough and clear left lower lobe rhonchi remained Musculoskeletal:     Cervical back: Normal range of motion.     Right lower leg: No edema.     Left lower leg: No edema.  Lymphadenopathy:     Cervical: No cervical adenopathy.  Skin:    General: Skin is warm and dry.     Capillary Refill: Capillary refill takes less than 2 seconds.     Findings: No erythema or rash.  Neurological:     General: No focal deficit present.     Mental Status: He is alert and oriented to person, place, and time.     Motor: No weakness.     Coordination: Coordination normal.     Gait: Gait is intact. Gait normal.  Psychiatric:  Mood and Affect: Mood normal.        Behavior: Behavior normal. Behavior is cooperative.        Thought Content: Thought content normal.        Judgment: Judgment normal.      Assessment & Plan:   Persistent atrial fibrillation (HCC) Plan: Continue follow-up with cardiology    COPD with asthma ([place]) Plan: Trial of Stiolto Respimat Chest x-ray today   Shortness of breath Plan: Chest x-ray today Walk  today in office Patient discharged home with oxygen Augmentin for 10 days Prednisone taper Close follow-up with our office in 4 weeks  Abnormal chest x-ray Abnormal chest x-ray with concerns for pneumonia.  Also included in the differential is progression of lung cancer or potentially drug-induced ILD given that patient is currently taking Keytruda as well as amiodarone  Plan: Augmentin today Prednisone taper Will notify oncology as oncology was planning on doing CT imaging of the patient this month   Internal staff message also sent to Dr. Lamonte [name] and John [name] as Juluis Rainier.  We will also route chart.   Return in about 4 weeks (around 01/15/2021), or if symptoms worsen or fail to improve, for Follow up with Dr. Lamonte [name], With Chest Xray.   Lauraine Rinne, NP 12/18/2020   This appointment required 44 minutes of patient care (this includes precharting, chart review, review of results, face-to-face care, etc.).

## 2020-12-18 NOTE — Assessment & Plan Note (Signed)
Plan: Trial of Stiolto Respimat Chest x-ray today

## 2020-12-18 NOTE — Telephone Encounter (Signed)
[facility] called with STAT call report.

## 2020-12-18 NOTE — Progress Notes (Signed)
Discussed results with patient in office.  Nothing further needed at this time.   Wyn Quaker, FNP

## 2020-12-18 NOTE — Telephone Encounter (Signed)
I called to let [facility] know we have a copy of the report and the provider is aware.

## 2020-12-18 NOTE — Patient Instructions (Addendum)
You were seen today by Lauraine Rinne, NP  for:   1. Shortness of breath  - amoxicillin-clavulanate (AUGMENTIN) 875-125 MG tablet; Take 1 tablet by mouth 2 (two) times daily.  Dispense: 20 tablet; Refill: 0  Augmentin >>> Take 1 875-125 mg tablet every 12 hours for the next 10 days >>> Take with food  Prednisone 10mg  tablet  >>>4 tabs for 3 days, then 3 tabs for 3 days, 2 tabs for 3 days, then 1 tab for 3 days, then resume daily 5mg  daily >>>take with food  >>>take in the morning    2. COPD with asthma ([place])  - DG Chest 2 View; Future - amoxicillin-clavulanate (AUGMENTIN) 875-125 MG tablet; Take 1 tablet by mouth 2 (two) times daily.  Dispense: 20 tablet; Refill: 0  Trial of Stiolto Respimat inhaler >>>2 puffs daily >>>Take this no matter what >>>This is not a rescue inhaler  Note your daily symptoms > remember "red flags" for COPD:   >>>Increase in cough >>>increase in sputum production >>>increase in shortness of breath or activity  intolerance.   If you notice these symptoms, please call the office to be seen.   Continue oxygen therapy as prescribed - 3L with exertion  >>>maintain oxygen saturations greater than 88 percent  >>>if unable to maintain oxygen saturations please contact the office  >>>do not smoke with oxygen  >>>can use nasal saline gel or nasal saline rinses to moisturize nose if oxygen causes dryness   3. Stage 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 (HCC)  Keep follow-up with [name] I will route my note to [name] today   We recommend today:  Orders Placed This Encounter  Procedures   DG Chest 2 View    Standing Status:   Future    Number of Occurrences:   1    Standing Expiration Date:   04/17/2021    Order Specific Question:   Reason for Exam (SYMPTOM  OR DIAGNOSIS REQUIRED)    Answer:   doe, stage IV lung cancer    Order Specific Question:   Preferred imaging location?    Answer:   Internal    Order Specific Question:   Radiology  Contrast Protocol - do NOT remove file path    Answer:   [email or web address]   Orders Placed This Encounter  Procedures   DG Chest 2 View   Meds ordered this encounter  Medications   amoxicillin-clavulanate (AUGMENTIN) 875-125 MG tablet    Sig: Take 1 tablet by mouth 2 (two) times daily.    Dispense:  20 tablet    Refill:  0   predniSONE (DELTASONE) 10 MG tablet    Sig: Take 4 tablets (40 mg total) by mouth daily with breakfast for 3 days, THEN 3 tablets (30 mg total) daily with breakfast for 3 days, THEN 2 tablets (20 mg total) daily with breakfast for 3 days, THEN 1 tablet (10 mg total) daily with breakfast for 3 days.    Dispense:  30 tablet    Refill:  0    Follow Up:    Return in about 4 weeks (around 01/15/2021), or if symptoms worsen or fail to improve, for Follow up with Dr. [name], With Chest Xray.   Notification of test results are managed in the following manner: If there are  any recommendations or changes to the  plan of care discussed in office today,  we will contact you and let you know what they are. If you do not hear  from [place], then your results are normal and you can view them through your  MyChart account , or a letter will be sent to you. Thank you again for trusting [place] with your care  - Thank you, [facility]    It is flu season:   >>> Best ways to protect herself from the flu: Receive the yearly flu vaccine, practice good hand hygiene washing with soap and also using hand sanitizer when available, eat a nutritious meals, get adequate rest, hydrate appropriately       Please contact the office if your symptoms worsen or you have concerns that you are not improving.   Thank you for choosing [facility] for your healthcare, and for allowing [place] to partner with you on your healthcare journey. I am thankful to be able to provide care to you today.   Wyn Quaker FNP-C

## 2020-12-18 NOTE — Addendum Note (Signed)
Addended by: Dessie Coma on: 12/18/2020 01:59 PM   Modules accepted: Orders

## 2020-12-18 NOTE — Assessment & Plan Note (Signed)
Plan: Chest x-ray today Walk today in office Patient discharged home with oxygen Augmentin for 10 days Prednisone taper Close follow-up with our office in 4 weeks

## 2020-12-18 NOTE — Assessment & Plan Note (Signed)
Abnormal chest x-ray with concerns for pneumonia.  Also included in the differential is progression of lung cancer or potentially drug-induced ILD given that patient is currently taking Keytruda as well as amiodarone  Plan: Augmentin today Prednisone taper Will notify oncology as oncology was planning on doing CT imaging of the patient this month

## 2020-12-18 NOTE — Assessment & Plan Note (Addendum)
Plan: Continue follow-up with cardiology 

## 2020-12-22 ENCOUNTER — Inpatient Hospital Stay (HOSPITAL_COMMUNITY)
Admission: EM | Admit: 2020-12-22 | Discharge: 2021-01-01 | DRG: 871 | Disposition: A | Discharge status: Home Health | Payer: 59 | Attending: Internal Medicine | Admitting: Internal Medicine

## 2020-12-22 ENCOUNTER — Other Ambulatory Visit: Payer: Self-pay

## 2020-12-22 ENCOUNTER — Emergency Department (HOSPITAL_COMMUNITY): Payer: 59

## 2020-12-22 ENCOUNTER — Encounter (HOSPITAL_COMMUNITY): Payer: Self-pay | Admitting: Internal Medicine

## 2020-12-22 DIAGNOSIS — D708 Other neutropenia: Secondary | ICD-10-CM

## 2020-12-22 DIAGNOSIS — E871 Hypo-osmolality and hyponatremia: Secondary | ICD-10-CM | POA: Diagnosis present

## 2020-12-22 DIAGNOSIS — C3490 Malignant neoplasm of unspecified part of unspecified bronchus or lung: Secondary | ICD-10-CM

## 2020-12-22 DIAGNOSIS — A419 Sepsis, unspecified organism: Principal | ICD-10-CM | POA: Diagnosis present

## 2020-12-22 DIAGNOSIS — I951 Orthostatic hypotension: Secondary | ICD-10-CM | POA: Diagnosis not present

## 2020-12-22 DIAGNOSIS — Z6828 Body mass index (BMI) 28.0-28.9, adult: Secondary | ICD-10-CM

## 2020-12-22 DIAGNOSIS — Z8673 Personal history of transient ischemic attack (TIA), and cerebral infarction without residual deficits: Secondary | ICD-10-CM

## 2020-12-22 DIAGNOSIS — C3491 Malignant neoplasm of unspecified part of right bronchus or lung: Secondary | ICD-10-CM | POA: Diagnosis not present

## 2020-12-22 DIAGNOSIS — J189 Pneumonia, unspecified organism: Secondary | ICD-10-CM

## 2020-12-22 DIAGNOSIS — J432 Centrilobular emphysema: Secondary | ICD-10-CM

## 2020-12-22 DIAGNOSIS — K219 Gastro-esophageal reflux disease without esophagitis: Secondary | ICD-10-CM | POA: Diagnosis present

## 2020-12-22 DIAGNOSIS — Z7952 Long term (current) use of systemic steroids: Secondary | ICD-10-CM

## 2020-12-22 DIAGNOSIS — T797XXA Traumatic subcutaneous emphysema, initial encounter: Secondary | ICD-10-CM | POA: Diagnosis not present

## 2020-12-22 DIAGNOSIS — R6520 Severe sepsis without septic shock: Secondary | ICD-10-CM | POA: Diagnosis present

## 2020-12-22 DIAGNOSIS — C3411 Malignant neoplasm of upper lobe, right bronchus or lung: Secondary | ICD-10-CM | POA: Diagnosis present

## 2020-12-22 DIAGNOSIS — Z87891 Personal history of nicotine dependence: Secondary | ICD-10-CM

## 2020-12-22 DIAGNOSIS — C7970 Secondary malignant neoplasm of unspecified adrenal gland: Secondary | ICD-10-CM | POA: Diagnosis present

## 2020-12-22 DIAGNOSIS — F419 Anxiety disorder, unspecified: Secondary | ICD-10-CM | POA: Diagnosis present

## 2020-12-22 DIAGNOSIS — C3402 Malignant neoplasm of left main bronchus: Secondary | ICD-10-CM

## 2020-12-22 DIAGNOSIS — J704 Drug-induced interstitial lung disorders, unspecified: Secondary | ICD-10-CM | POA: Diagnosis present

## 2020-12-22 DIAGNOSIS — J4489 Other specified chronic obstructive pulmonary disease: Secondary | ICD-10-CM | POA: Diagnosis present

## 2020-12-22 DIAGNOSIS — R0602 Shortness of breath: Secondary | ICD-10-CM

## 2020-12-22 DIAGNOSIS — Z8249 Family history of ischemic heart disease and other diseases of the circulatory system: Secondary | ICD-10-CM

## 2020-12-22 DIAGNOSIS — T50905A Adverse effect of unspecified drugs, medicaments and biological substances, initial encounter: Secondary | ICD-10-CM | POA: Diagnosis present

## 2020-12-22 DIAGNOSIS — F32A Depression, unspecified: Secondary | ICD-10-CM | POA: Diagnosis present

## 2020-12-22 DIAGNOSIS — M0530 Rheumatoid heart disease with rheumatoid arthritis of unspecified site: Secondary | ICD-10-CM | POA: Diagnosis present

## 2020-12-22 DIAGNOSIS — D709 Neutropenia, unspecified: Secondary | ICD-10-CM | POA: Diagnosis present

## 2020-12-22 DIAGNOSIS — M069 Rheumatoid arthritis, unspecified: Secondary | ICD-10-CM | POA: Diagnosis present

## 2020-12-22 DIAGNOSIS — Z20822 Contact with and (suspected) exposure to covid-19: Secondary | ICD-10-CM | POA: Diagnosis present

## 2020-12-22 DIAGNOSIS — J449 Chronic obstructive pulmonary disease, unspecified: Secondary | ICD-10-CM | POA: Diagnosis present

## 2020-12-22 DIAGNOSIS — I480 Paroxysmal atrial fibrillation: Secondary | ICD-10-CM | POA: Diagnosis present

## 2020-12-22 DIAGNOSIS — Z7901 Long term (current) use of anticoagulants: Secondary | ICD-10-CM

## 2020-12-22 DIAGNOSIS — J9621 Acute and chronic respiratory failure with hypoxia: Secondary | ICD-10-CM

## 2020-12-22 DIAGNOSIS — R9389 Abnormal findings on diagnostic imaging of other specified body structures: Secondary | ICD-10-CM

## 2020-12-22 DIAGNOSIS — T451X5A Adverse effect of antineoplastic and immunosuppressive drugs, initial encounter: Secondary | ICD-10-CM | POA: Diagnosis present

## 2020-12-22 DIAGNOSIS — Z923 Personal history of irradiation: Secondary | ICD-10-CM

## 2020-12-22 DIAGNOSIS — D72819 Decreased white blood cell count, unspecified: Secondary | ICD-10-CM | POA: Diagnosis present

## 2020-12-22 DIAGNOSIS — I444 Left anterior fascicular block: Secondary | ICD-10-CM | POA: Diagnosis present

## 2020-12-22 DIAGNOSIS — J984 Other disorders of lung: Secondary | ICD-10-CM

## 2020-12-22 DIAGNOSIS — Z888 Allergy status to other drugs, medicaments and biological substances status: Secondary | ICD-10-CM

## 2020-12-22 DIAGNOSIS — J9601 Acute respiratory failure with hypoxia: Secondary | ICD-10-CM

## 2020-12-22 DIAGNOSIS — E785 Hyperlipidemia, unspecified: Secondary | ICD-10-CM | POA: Diagnosis present

## 2020-12-22 DIAGNOSIS — D469 Myelodysplastic syndrome, unspecified: Secondary | ICD-10-CM | POA: Diagnosis present

## 2020-12-22 DIAGNOSIS — Z79899 Other long term (current) drug therapy: Secondary | ICD-10-CM

## 2020-12-22 DIAGNOSIS — I700 Atherosclerosis of aorta: Secondary | ICD-10-CM | POA: Diagnosis present

## 2020-12-22 DIAGNOSIS — I2510 Atherosclerotic heart disease of native coronary artery without angina pectoris: Secondary | ICD-10-CM | POA: Diagnosis present

## 2020-12-22 LAB — TROPONIN I (HIGH SENSITIVITY)
Troponin I (High Sensitivity): 12 ng/L (ref <18)
Troponin I (High Sensitivity): 11 ng/L (ref <18)

## 2020-12-22 LAB — RESPIRATORY PANEL BY PCR

## 2020-12-22 LAB — CBC WITH DIFFERENTIAL/PLATELET
Abs Immature Granulocytes: 0.01 10*3/uL (ref 0.00–0.07)
Basophils Absolute: 0 10*3/uL (ref 0.0–0.1)
Basophils Relative: 1 %
Eosinophils Absolute: 0.1 10*3/uL (ref 0.0–0.5)
Eosinophils Relative: 4 %
HCT: 37.2 % — ABNORMAL LOW (ref 39.0–52.0)
Hemoglobin: 12.7 g/dL — ABNORMAL LOW (ref 13.0–17.0)
Immature Granulocytes: 1 %
Lymphocytes Relative: 33 %
Lymphs Abs: 0.6 10*3/uL — ABNORMAL LOW (ref 0.7–4.0)
MCH: 28.9 pg (ref 26.0–34.0)
MCHC: 34.1 g/dL (ref 30.0–36.0)
MCV: 84.5 fL (ref 80.0–100.0)
Monocytes Absolute: 0.3 10*3/uL (ref 0.1–1.0)
Monocytes Relative: 17 %
Neutro Abs: 0.9 10*3/uL — ABNORMAL LOW (ref 1.7–7.7)
Neutrophils Relative %: 44 %
Platelets: 294 10*3/uL (ref 150–400)
RBC: 4.4 MIL/uL (ref 4.22–5.81)
RDW: 14.4 % (ref 11.5–15.5)
WBC: 1.9 10*3/uL — ABNORMAL LOW (ref 4.0–10.5)
nRBC: 0 % (ref 0.0–0.2)

## 2020-12-22 LAB — COMPREHENSIVE METABOLIC PANEL
ALT: 18 U/L (ref 0–44)
AST: 33 U/L (ref 15–41)
Albumin: 2.5 g/dL — ABNORMAL LOW (ref 3.5–5.0)
Alkaline Phosphatase: 68 U/L (ref 38–126)
Anion gap: 11 (ref 5–15)
BUN: 16 mg/dL (ref 6–20)
CO2: 27 mmol/L (ref 22–32)
Calcium: 8.7 mg/dL — ABNORMAL LOW (ref 8.9–10.3)
Chloride: 91 mmol/L — ABNORMAL LOW (ref 98–111)
Creatinine, Ser: 1.07 mg/dL (ref 0.61–1.24)
GFR, Estimated: >60 mL/min (ref >60)
Glucose, Bld: 112 mg/dL — ABNORMAL HIGH (ref 70–99)
Potassium: 4.1 mmol/L (ref 3.5–5.1)
Sodium: 129 mmol/L — ABNORMAL LOW (ref 135–145)
Total Bilirubin: 1 mg/dL (ref 0.3–1.2)
Total Protein: 7 g/dL (ref 6.5–8.1)

## 2020-12-22 LAB — URINALYSIS, ROUTINE W REFLEX MICROSCOPIC
Bilirubin Urine: NEGATIVE
Glucose, UA: NEGATIVE mg/dL
Hgb urine dipstick: NEGATIVE
Ketones, ur: 5 mg/dL — AB
Leukocytes,Ua: NEGATIVE
Nitrite: NEGATIVE
Protein, ur: NEGATIVE mg/dL
Specific Gravity, Urine: 1.029 (ref 1.005–1.030)
pH: 6 (ref 5.0–8.0)

## 2020-12-22 LAB — I-STAT CHEM 8, ED
BUN: 19 mg/dL (ref 6–20)
Calcium, Ion: 1.06 mmol/L — ABNORMAL LOW (ref 1.15–1.40)
Chloride: 92 mmol/L — ABNORMAL LOW (ref 98–111)
Creatinine, Ser: 1.2 mg/dL (ref 0.61–1.24)
Glucose, Bld: 106 mg/dL — ABNORMAL HIGH (ref 70–99)
HCT: 37 % — ABNORMAL LOW (ref 39.0–52.0)
Hemoglobin: 12.6 g/dL — ABNORMAL LOW (ref 13.0–17.0)
Potassium: 4.7 mmol/L (ref 3.5–5.1)
Sodium: 128 mmol/L — ABNORMAL LOW (ref 135–145)
TCO2: 27 mmol/L (ref 22–32)

## 2020-12-22 LAB — APTT: aPTT: 38 seconds — ABNORMAL HIGH (ref 24–36)

## 2020-12-22 LAB — BRAIN NATRIURETIC PEPTIDE: B Natriuretic Peptide: 17.9 pg/mL (ref 0.0–100.0)

## 2020-12-22 LAB — BLOOD GAS, VENOUS
Acid-Base Excess: 2.5 mmol/L — ABNORMAL HIGH (ref 0.0–2.0)
Bicarbonate: 27.5 mmol/L (ref 20.0–28.0)
O2 Saturation: 37.2 %
Patient temperature: 98.6
pCO2, Ven: 46 mmHg (ref 44.0–60.0)
pH, Ven: 7.394 (ref 7.250–7.430)
pO2, Ven: 26.8 mmHg — CL (ref 32.0–45.0)

## 2020-12-22 LAB — RESP PANEL BY RT-PCR (FLU A&B, COVID) ARPGX2
Influenza A by PCR: NEGATIVE
Influenza B by PCR: NEGATIVE
SARS Coronavirus 2 by RT PCR: NEGATIVE

## 2020-12-22 LAB — PROTIME-INR
INR: 2.2 — ABNORMAL HIGH (ref 0.8–1.2)
Prothrombin Time: 24.6 seconds — ABNORMAL HIGH (ref 11.4–15.2)

## 2020-12-22 LAB — MRSA NEXT GEN BY PCR, NASAL: MRSA by PCR Next Gen: NOT DETECTED

## 2020-12-22 LAB — LACTIC ACID, PLASMA
Lactic Acid, Venous: 2 mmol/L (ref 0.5–1.9)
Lactic Acid, Venous: 1.3 mmol/L (ref 0.5–1.9)

## 2020-12-22 LAB — PROCALCITONIN: Procalcitonin: 0.22 ng/mL

## 2020-12-22 MED ORDER — RIVAROXABAN 20 MG PO TABS
20.0000 mg | ORAL_TABLET | Freq: Every day | ORAL | Status: DC
  Administered 2020-12-22: 20 mg via ORAL
  Filled 2020-12-22: qty 1

## 2020-12-22 MED ORDER — AMIODARONE HCL 200 MG PO TABS
200.0000 mg | ORAL_TABLET | Freq: Every evening | ORAL | Status: DC
  Administered 2020-12-22 – 2020-12-31 (×10): 200 mg via ORAL
  Filled 2020-12-22 (×10): qty 1

## 2020-12-22 MED ORDER — LACTATED RINGERS IV SOLN: INTRAVENOUS | Status: DC

## 2020-12-22 MED ORDER — PAROXETINE HCL 10 MG PO TABS
10.0000 mg | ORAL_TABLET | Freq: Every day | ORAL | Status: DC
  Administered 2020-12-23 – 2021-01-01 (×10): 10 mg via ORAL
  Filled 2020-12-22 (×10): qty 1

## 2020-12-22 MED ORDER — ACETAMINOPHEN 325 MG PO TABS: 650.0000 mg | ORAL_TABLET | Freq: Four times a day (QID) | ORAL | Status: DC | PRN

## 2020-12-22 MED ORDER — METHYLPREDNISOLONE SODIUM SUCC 125 MG IJ SOLR: 125.0000 mg | Freq: Every day | INTRAMUSCULAR | Status: DC

## 2020-12-22 MED ORDER — ATORVASTATIN CALCIUM 10 MG PO TABS: 10.0000 mg | ORAL_TABLET | Freq: Every day | ORAL | Status: DC

## 2020-12-22 MED ORDER — ACETAMINOPHEN 650 MG RE SUPP: 650.0000 mg | Freq: Four times a day (QID) | RECTAL | Status: DC | PRN

## 2020-12-22 MED ORDER — IPRATROPIUM BROMIDE 0.02 % IN SOLN
0.5000 mg | Freq: Four times a day (QID) | RESPIRATORY_TRACT | Status: DC
  Administered 2020-12-22 – 2020-12-23 (×6): 0.5 mg via RESPIRATORY_TRACT
  Filled 2020-12-22 (×6): qty 2.5

## 2020-12-22 MED ORDER — LACTATED RINGERS IV BOLUS
1000.0000 mL | Freq: Once | INTRAVENOUS | Status: AC
  Administered 2020-12-22: 1000 mL via INTRAVENOUS

## 2020-12-22 MED ORDER — LEVALBUTEROL HCL 0.63 MG/3ML IN NEBU
0.6300 mg | INHALATION_SOLUTION | Freq: Four times a day (QID) | RESPIRATORY_TRACT | Status: DC | PRN
  Administered 2020-12-23: 0.63 mg via RESPIRATORY_TRACT
  Filled 2020-12-22: qty 3

## 2020-12-22 MED ORDER — AZITHROMYCIN 250 MG PO TABS
500.0000 mg | ORAL_TABLET | Freq: Every day | ORAL | Status: DC
  Administered 2020-12-23 – 2020-12-25 (×3): 500 mg via ORAL
  Filled 2020-12-22 (×3): qty 2

## 2020-12-22 MED ORDER — METHYLPREDNISOLONE SODIUM SUCC 125 MG IJ SOLR
125.0000 mg | Freq: Once | INTRAMUSCULAR | Status: AC
  Administered 2020-12-22: 125 mg via INTRAVENOUS
  Filled 2020-12-22: qty 2

## 2020-12-22 MED ORDER — CHLORHEXIDINE GLUCONATE CLOTH 2 % EX PADS
6.0000 | MEDICATED_PAD | Freq: Every day | CUTANEOUS | Status: DC
  Administered 2020-12-24 – 2020-12-25 (×2): 6 via TOPICAL

## 2020-12-22 MED ORDER — VANCOMYCIN HCL IN DEXTROSE 1-5 GM/200ML-% IV SOLN: 1000.0000 mg | Freq: Once | INTRAVENOUS | Status: DC

## 2020-12-22 MED ORDER — UMECLIDINIUM-VILANTEROL 62.5-25 MCG/ACT IN AEPB
1.0000 | INHALATION_SPRAY | Freq: Every day | RESPIRATORY_TRACT | Status: DC
  Administered 2020-12-23 – 2021-01-01 (×10): 1 via RESPIRATORY_TRACT
  Filled 2020-12-22 (×3): qty 14

## 2020-12-22 MED ORDER — SODIUM CHLORIDE 0.9 % IV SOLN
2.0000 g | Freq: Once | INTRAVENOUS | Status: AC
  Administered 2020-12-22: 2 g via INTRAVENOUS
  Filled 2020-12-22: qty 2

## 2020-12-22 MED ORDER — LEVALBUTEROL HCL 0.63 MG/3ML IN NEBU
0.6300 mg | INHALATION_SOLUTION | Freq: Four times a day (QID) | RESPIRATORY_TRACT | Status: DC
  Administered 2020-12-22 – 2020-12-23 (×5): 0.63 mg via RESPIRATORY_TRACT
  Filled 2020-12-22 (×5): qty 3

## 2020-12-22 MED ORDER — PANTOPRAZOLE SODIUM 40 MG PO TBEC
40.0000 mg | DELAYED_RELEASE_TABLET | Freq: Every day | ORAL | Status: DC
  Administered 2020-12-22 – 2020-12-31 (×10): 40 mg via ORAL
  Filled 2020-12-22 (×10): qty 1

## 2020-12-22 MED ORDER — METOPROLOL SUCCINATE ER 25 MG PO TB24
25.0000 mg | ORAL_TABLET | Freq: Every day | ORAL | Status: DC
  Administered 2020-12-22: 25 mg via ORAL
  Filled 2020-12-22: qty 1
  Filled 2020-12-22: qty 0.5

## 2020-12-22 MED ORDER — SODIUM CHLORIDE 0.9 % IV SOLN
2.0000 g | Freq: Three times a day (TID) | INTRAVENOUS | Status: DC
  Administered 2020-12-22 – 2020-12-26 (×11): 2 g via INTRAVENOUS
  Filled 2020-12-22 (×11): qty 2

## 2020-12-22 MED ORDER — SODIUM CHLORIDE 0.9 % IV SOLN
500.0000 mg | Freq: Once | INTRAVENOUS | Status: AC
  Administered 2020-12-22: 500 mg via INTRAVENOUS
  Filled 2020-12-22: qty 500

## 2020-12-22 MED ORDER — VANCOMYCIN HCL 1750 MG/350ML IV SOLN
1750.0000 mg | Freq: Once | INTRAVENOUS | Status: AC
  Administered 2020-12-22: 1750 mg via INTRAVENOUS
  Filled 2020-12-22: qty 350

## 2020-12-22 MED ORDER — ONDANSETRON HCL 4 MG PO TABS: 4.0000 mg | ORAL_TABLET | Freq: Four times a day (QID) | ORAL | Status: DC | PRN

## 2020-12-22 MED ORDER — ONDANSETRON HCL 4 MG/2ML IJ SOLN: 4.0000 mg | Freq: Four times a day (QID) | INTRAMUSCULAR | Status: DC | PRN

## 2020-12-22 MED ORDER — IOHEXOL 350 MG/ML SOLN
80.0000 mL | Freq: Once | INTRAVENOUS | Status: AC | PRN
  Administered 2020-12-22: 72 mL via INTRAVENOUS

## 2020-12-22 MED ORDER — OMEPRAZOLE MAGNESIUM 20 MG PO TBEC: 20.0000 mg | DELAYED_RELEASE_TABLET | Freq: Every day | ORAL | Status: DC

## 2020-12-22 NOTE — Consult Note (Addendum)
NAME:  John [name], MRN:  [id number], DOB:  1960/04/28, LOS: 0 ADMISSION DATE:  12/22/2020, CONSULTATION DATE:  12/22/20 REFERRING MD:  Olevia Bowens, CHIEF COMPLAINT:  Dyspnea   History of Present Illness:  John [name] is a 60 y.o. 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  He reports increased productive cough with white sputum along with sweats and malaise/fatigue.   No known ill contacts.  He was seen by cardiology who did not feel this was a cardiac issue, so was seen at the pulmonary office several days ago and prescribed Augmentin and Prednisone along with nasal cannula O2.   He was using 6L over the last several days, but became so acutely short of breath that he could hardly walk and home O2 sats read 79%, so presented to the ED.   In the ED required 15L and non-rebreather.  CXR with diffuse bilateral heterogenous opacities unchanged from prior. CTA chest without PE, findings consistent with progressive pnemonitis.   Lactic acid 2.0,  WBC 1.9, ABG 7.39, pCO2 46, pO2 26, Na 129.  He was given Solumedrol, Vancomycin, Cefepime, Azithromycin, Xopanex and admitted to the hospital medicine service.  PCCM consulted.   Pertinent  Medical History   has a past medical history of Dyspnea, Family history of adverse reaction to anesthesia, GERD (gastroesophageal reflux disease), History of radiation therapy (04/24/2020-05/16/2020), PAF (paroxysmal atrial fibrillation) ([place]), Rheumatoid aortitis, and Squamous cell lung cancer ([place]).   Significant Hospital Events: Including procedures, antibiotic start and stop dates in addition to other pertinent events   11/7 presented to the ED with dyspnea and worsening O2 requirement, CTA chest consistent with worsening pneumonitis  Interim History / Subjective:  Pt awake and alert, requiring NRBM and HFNC.    Objective    Blood pressure 98/80, pulse (!) 102, temperature 98.7 F (37.1 C), temperature source Oral, resp. rate (!) 33, height 5\' 11"  (1.803 m), weight 93 kg, SpO2 99 %.        Intake/Output Summary (Last 24 hours) at 12/22/2020 1348 Last data filed at 12/22/2020 1239 Gross per 24 hour  Intake 1100 ml  Output --  Net 1100 ml   Filed Weights   12/22/20 0930  Weight: 93 kg    General:  well-nourished M, uncomfortable-appearing but no severe distress, awake and responsive HEENT: MM pink/moist, pupils equal Neuro: awake, alert, oriented, moving all extremities CV: s1s2 rrr, no m/r/g PULM:  seen on 15L HFNC and non-rebreather, mildly tachypneic, slight expiratory wheeze and few crackles bilateral bases but moving air well, no accessory muscle use GI: soft, bsx4 active  Extremities: warm/dry, no edema  Skin: no rashes or lesions   Resolved Hospital Problem list     Assessment & Plan:   Acute Hypoxic Respiratory Failure  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, also on Amiodarone  No PE, worsening aeration consistent with pneumonitis, most likely secondary to to [facility], though symptoms onset does sound somewhat acute with dyspnea and productive cough beginning one week ago -Covid-19 and flu negative, check extended RVP -agree with Solumedrol, continue 125mg  qd -continue supplemental O2 to maintain O2 sats >92% -Given Vanc/Azith/Cefepime in the ED, MRSA PCR negative and no clear infiltrate on CT, so will de-escalate to Cefepime and Azithromycin -continue Amiodarone for now, would like to avoid RVR complicating the clinical picture -continue  xopanex nebs  -Bipap can be used if worsening on high flow, but would likely need intubated if progressing to that point   Thank you for this consult, PCCM will continue to follow with you   Best Practice (right click and "Reselect all SmartList Selections" daily)   Diet/type: Regular consistency (see  orders) DVT prophylaxis: DOAC GI prophylaxis: N/A Lines: N/A Foley:  N/A Code Status:  full code Last date of multidisciplinary goals of care discussion [pending, wife at the bedside with patient and updated on plan of care]  Labs   CBC: Recent Labs  Lab 12/22/20 1000 12/22/20 1035  WBC 1.9*  --   NEUTROABS 0.9*  --   HGB 12.7* 12.6*  HCT 37.2* 37.0*  MCV 84.5  --   PLT 294  --     Basic Metabolic Panel: Recent Labs  Lab 12/22/20 1003 12/22/20 1035  NA 129* 128*  K 4.1 4.7  CL 91* 92*  CO2 27  --   GLUCOSE 112* 106*  BUN 16 19  CREATININE 1.07 1.20  CALCIUM 8.7*  --    GFR: Estimated Creatinine Clearance: 77.3 mL/min (by C-G formula based on SCr of 1.2 mg/dL). Recent Labs  Lab 12/22/20 1000 12/22/20 1003 12/22/20 1234  WBC 1.9*  --   --   LATICACIDVEN  --  2.0* 1.3    Liver Function Tests: Recent Labs  Lab 12/22/20 1003  AST 33  ALT 18  ALKPHOS 68  BILITOT 1.0  PROT 7.0  ALBUMIN 2.5*   No results for input(s): LIPASE, AMYLASE in the last 168 hours. No results for input(s): AMMONIA in the last 168 hours.  ABG    Component Value Date/Time   HCO3 27.5 12/22/2020 1002   TCO2 27 12/22/2020 1035   O2SAT 37.2 12/22/2020 1002     Coagulation Profile: Recent Labs  Lab 12/22/20 1003  INR 2.2*    Cardiac Enzymes: No results for input(s): CKTOTAL, CKMB, CKMBINDEX, TROPONINI in the last 168 hours.  HbA1C: No results found for: HGBA1C  CBG: No results for input(s): GLUCAP in the last 168 hours.  Review of Systems:   Review of Systems  Constitutional:  Positive for chills and malaise/fatigue. Negative for fever and weight loss.  Respiratory:  Positive for cough, sputum production and shortness of breath. Negative for hemoptysis and wheezing.   Cardiovascular: Negative.   Gastrointestinal:  Positive for diarrhea. Negative for nausea and vomiting.  Genitourinary: Negative.   Musculoskeletal:  Negative for myalgias.    Past Medical History:   He,  has a past medical history of Dyspnea, Family history of adverse reaction to anesthesia, GERD (gastroesophageal reflux disease), History of radiation therapy (04/24/2020-05/16/2020), PAF (paroxysmal atrial fibrillation) ([place]), Rheumatoid aortitis, and Squamous cell lung cancer ([place]).   Surgical History:   Past Surgical History:  Procedure Laterality Date   BRONCHIAL BRUSHINGS  03/27/2020   Procedure: BRONCHIAL BRUSHINGS;  Surgeon: [name], MD;  Location: [place];  Service: Cardiopulmonary;;   BUBBLE STUDY  10/13/2020   Procedure: BUBBLE STUDY;  Surgeon: Pixie Casino, MD;  Location: [place];  Service: Cardiovascular;;   CARDIOVERSION N/A 10/13/2020   Procedure: CARDIOVERSION;  Surgeon: Pixie Casino, MD;  Location: [facility] ENDOSCOPY;  Service: Cardiovascular;  Laterality: N/A;   CATARACT EXTRACTION  2016   at [place]  03/27/2020   Procedure: FINE NEEDLE ASPIRATION;  Surgeon: [name], MD;  Location: [facility] ENDOSCOPY;  Service: Cardiopulmonary;;   HIP SURGERY  Left    TEE WITHOUT CARDIOVERSION N/A 10/13/2020   Procedure: TRANSESOPHAGEAL ECHOCARDIOGRAM (TEE);  Surgeon: Pixie Casino, MD;  Location: [place];  Service: Cardiovascular;  Laterality: N/A;   VIDEO BRONCHOSCOPY WITH ENDOBRONCHIAL ULTRASOUND N/A 03/27/2020   Procedure: VIDEO BRONCHOSCOPY WITH ENDOBRONCHIAL ULTRASOUND;  Surgeon: [name], MD;  Location: [place];  Service: Cardiopulmonary;  Laterality: N/A;     Social History:   reports that he quit smoking about 4 years ago. His smoking use included cigarettes. He has a 30.00 pack-year smoking history. He has never used smokeless tobacco. He reports that he does not currently use alcohol. He reports that he does not use drugs.   Family History:  His family history includes Arrhythmia in his mother; Heart disease in his father.   Allergies Allergies  Allergen Reactions   Albuterol Other (See Comments)    Patient has  had episode of atrial fib following administration of albuterol (tolerates Xopenex well)     Home Medications  Prior to Admission medications   Medication Sig Start Date End Date Taking? Authorizing Provider  amiodarone (PACERONE) 200 MG tablet Take 1 tablet (200 mg total) by mouth daily. Patient taking differently: Take 200 mg by mouth every evening. 12/16/20 01/15/21 Yes Fenton, Clint R, PA  amoxicillin-clavulanate (AUGMENTIN) 875-125 MG tablet Take 1 tablet by mouth 2 (two) times daily. 12/18/20  Yes Lauraine Rinne, NP  levalbuterol [facility] HFA) 45 MCG/ACT inhaler Inhale 2 puffs into the lungs every 4 (four) hours as needed for wheezing. 06/11/20  Yes [name], MD  metoprolol succinate (TOPROL XL) 25 MG 24 hr tablet Take 1 tablet (25 mg total) by mouth daily. Patient taking differently: Take 25 mg by mouth at bedtime. 11/19/20  Yes Imogene Burn, PA-C  PARoxetine (PAXIL) 10 MG tablet Take 10 mg by mouth daily. 01/25/20  Yes [provider]  pembrolizumab (KEYTRUDA) 100 MG/4ML SOLN Inject 2 mg/kg into the vein every 21 ( twenty-one) days.   Yes [provider]  predniSONE (DELTASONE) 5 MG tablet Take 5 mg by mouth daily. 09/01/20  Yes [provider]  PRILOSEC OTC 20 MG tablet Take 20 mg by mouth at bedtime. 11/13/19  Yes [provider]  rivaroxaban (XARELTO) 20 MG TABS tablet Take 1 tablet (20 mg total) by mouth daily with supper. 11/13/20 02/11/21 Yes Turner, Eber Hong, MD  atorvastatin (LIPITOR) 10 MG tablet Take 1 tablet (10 mg total) by mouth daily. 11/19/20 02/17/21  Imogene Burn, PA-C  predniSONE (DELTASONE) 10 MG tablet Take 4 tablets (40 mg total) by mouth daily with breakfast for 3 days, THEN 3 tablets (30 mg total) daily with breakfast for 3 days, THEN 2 tablets (20 mg total) daily with breakfast for 3 days, THEN 1 tablet (10 mg total) daily with breakfast for 3 days. Patient not taking: Reported on 12/22/2020 12/18/20 12/30/20  Lauraine Rinne, NP   Tiotropium Bromide-Olodaterol (STIOLTO RESPIMAT) 2.5-2.5 MCG/ACT AERS Inhale 2 puffs into the lungs daily. 12/18/20   Lauraine Rinne, NP     Critical care time: n/a      Otilio Carpen Creg Gilmer, PA-C [facility] See Amion for pager If no response to pager , please call [phone] until 7pm After 7:00 pm call Elink  [phone]?[place]

## 2020-12-22 NOTE — Sepsis Progress Note (Signed)
ELink following sepsis protocol 

## 2020-12-22 NOTE — ED Notes (Signed)
Patient transported to CT 

## 2020-12-22 NOTE — Progress Notes (Signed)
A consult was received from an ED physician for vancomycin and cefepime per pharmacy dosing for pneumonia.  The patient's profile has been reviewed for ht/wt/allergies/indication/available labs.   A one time order has been placed for vancomycin 1750mg  IV x1 and cefepime 2g IV x1.  Further antibiotics/pharmacy consults should be ordered by admitting physician if indicated.                       Thank you,  Dimple Nanas, PharmD 12/22/2020 10:07 AM

## 2020-12-22 NOTE — ED Triage Notes (Signed)
Pt comes in for shortness of breath. States he has been short of breath for about 1 week. States he is on antibiotics for an infection but states he doesn't feel like he is getting better. Pt is on treatment for lung cancer

## 2020-12-22 NOTE — ED Provider Notes (Signed)
[facility] DEPT Provider Note   CSN: [id number] Arrival date & time: 12/22/20  4098     History Chief Complaint  Patient presents with   Shortness of Breath    John [name] is a 60 y.o. male.   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  Patient states that he has had symptoms for roughly the past week.  He endorses a productive cough of white sputum, chills, subjective warmth/fever with a T-max of 99 at home, malaise.  He was seen in his pulmonology clinic for an office visit on 12/18/2020 where an x-ray was concerning for pneumonia.  Additional differential consideration has been progression of his lung cancer potentially drug-induced ILD given that he was taking Keytruda as well as amiodarone.  He was placed on a prednisone taper and started on Augmentin.  He states that he was unable to complete his prednisone course due to hallucinations associated with his prednisone.  He has been taking Augmentin outpatient and continues to endorse worsening dyspnea.  He denies any chest pain.  Past Medical History:  Diagnosis Date   Dyspnea    Family history of adverse reaction to anesthesia    brother with seizures had episode under anesthesia.  Patient has seizures as well.   GERD (gastroesophageal reflux disease)    History of radiation therapy 04/24/2020-05/16/2020   IMRT to right lung     Dr [name]   PAF (paroxysmal atrial fibrillation) ([place])    CHADS2VSAC score 0   Rheumatoid aortitis    Squamous cell lung cancer [facility])     Patient Active Problem List   Diagnosis Date Noted   Acute on  chronic respiratory failure with hypoxia ([place]) 12/22/2020   Shortness of breath 12/18/2020   Abnormal chest x-ray 12/18/2020   Secondary hypercoagulable state ([place]) 10/28/2020   Malignant neoplasm of lung (HCC)    Paroxysmal A-fib ([place]) 10/06/2020   RA (rheumatoid arthritis) ([place]) 10/06/2020   Hypotension 10/06/2020   Sepsis ([place]) 10/06/2020   Persistent atrial fibrillation ([place]) 08/25/2020   Drug-induced neutropenia ([place]) 08/11/2020   PAF (paroxysmal atrial fibrillation) ([place])    Encounter for antineoplastic immunotherapy 06/09/2020   Hypokalemia    Orthostatic hypotension    C. difficile colitis    Bacteremia due to Pseudomonas    Diarrhea    Atrial fibrillation with RVR ([place]) 05/12/2020   Hyponatremia 05/12/2020   Neutropenic fever ([place]) 05/12/2020   Occlusion of right pulmonary artery ([place]) 05/12/2020   Neutropenia ([place]) 05/12/2020   Thrombus    Stage 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 ([place]) 04/10/2020   Encounter for antineoplastic chemotherapy 04/10/2020   Mass of right lung 03/24/2020   COPD with asthma ([place]) 03/24/2020    Past Surgical History:  Procedure Laterality Date   BRONCHIAL BRUSHINGS  03/27/2020   Procedure: BRONCHIAL BRUSHINGS;  Surgeon: [name], MD;  Location: [place];  Service: Cardiopulmonary;;   BUBBLE STUDY  10/13/2020   Procedure: BUBBLE STUDY;  Surgeon: Pixie Casino, MD;  Location: [place];  Service: Cardiovascular;;   CARDIOVERSION N/A 10/13/2020   Procedure: CARDIOVERSION;  Surgeon: Pixie Casino,  MD;  Location: [place];  Service: Cardiovascular;  Laterality: N/A;   CATARACT EXTRACTION  2016   at [place]  03/27/2020   Procedure: [place];  Surgeon: [name], MD;  Location: [place] [facility];  Service: Cardiopulmonary;;   HIP SURGERY Left    TEE WITHOUT CARDIOVERSION N/A 10/13/2020   Procedure: TRANSESOPHAGEAL ECHOCARDIOGRAM (TEE);  Surgeon: Pixie Casino, MD;  Location: [place];  Service: Cardiovascular;  Laterality: N/A;   VIDEO BRONCHOSCOPY WITH ENDOBRONCHIAL ULTRASOUND N/A 03/27/2020   Procedure: VIDEO BRONCHOSCOPY WITH ENDOBRONCHIAL ULTRASOUND;  Surgeon: [name], MD;  Location: [place];  Service: Cardiopulmonary;  Laterality: N/A;       Family History  Problem Relation Age of Onset   Arrhythmia Mother        has PPM   Heart disease Father        Died at 25, started in his 4s, heart attacks, had PPM and ICD    Social History   Tobacco Use   Smoking status: Former    Packs/day: 1.00    Years: 30.00    Pack years: 30.00    Types: Cigarettes    Quit date: 2018    Years since quitting: 4.8   Smokeless tobacco: Never   Tobacco comments:    Former smoker 10/28/2020  Vaping Use   Vaping Use: Never used  Substance Use Topics   Alcohol use: Not Currently   Drug use: Never    Home Medications Prior to Admission medications   Medication Sig Start Date End Date Taking? Authorizing Provider  amiodarone (PACERONE) 200 MG tablet Take 1 tablet (200 mg total) by mouth daily. Patient taking differently: Take 200 mg by mouth every evening. 12/16/20 01/15/21 Yes Fenton, Clint R, PA  amoxicillin-clavulanate (AUGMENTIN) 875-125 MG tablet Take 1 tablet by mouth 2 (two) times daily. 12/18/20  Yes Lauraine Rinne, NP  levalbuterol [facility] HFA) 45 MCG/ACT inhaler Inhale 2 puffs into the lungs every 4 (four) hours as needed for wheezing. 06/11/20  Yes [name], MD  metoprolol succinate (TOPROL XL) 25 MG 24 hr tablet Take 1 tablet (25 mg total) by mouth daily. Patient taking differently: Take 25 mg by mouth at bedtime. 11/19/20  Yes Imogene Burn, PA-C  PARoxetine (PAXIL) 10 MG tablet Take 10 mg by mouth daily. 01/25/20  Yes [provider]  pembrolizumab (KEYTRUDA) 100 MG/4ML SOLN Inject 2 mg/kg into the vein every 21 ( twenty-one) days.   Yes [provider]  predniSONE (DELTASONE) 5 MG tablet Take 5 mg by mouth daily.  09/01/20  Yes [provider]  PRILOSEC OTC 20 MG tablet Take 20 mg by mouth at bedtime. 11/13/19  Yes [provider]  rivaroxaban (XARELTO) 20 MG TABS tablet Take 1 tablet (20 mg total) by mouth daily with supper. 11/13/20 02/11/21 Yes Turner, Eber Hong, MD  atorvastatin (LIPITOR) 10 MG tablet Take 1 tablet (10 mg total) by mouth daily. 11/19/20 02/17/21  Imogene Burn, PA-C  predniSONE (DELTASONE) 10 MG tablet Take 4 tablets (40 mg total) by mouth daily with breakfast for 3 days, THEN 3 tablets (30 mg total) daily with breakfast for 3 days, THEN 2 tablets (20 mg total) daily with breakfast for 3 days, THEN 1 tablet (10 mg total) daily with breakfast for 3 days. Patient not taking: Reported on 12/22/2020 12/18/20 12/30/20  Lauraine Rinne, NP  Tiotropium Bromide-Olodaterol (STIOLTO RESPIMAT) 2.5-2.5 MCG/ACT AERS Inhale 2 puffs into  the lungs daily. 12/18/20   Lauraine Rinne, NP    Allergies    Albuterol  Review of Systems   Review of Systems  Constitutional:  Positive for chills, fatigue and fever.  HENT:  Negative for ear pain and sore throat.   Eyes:  Negative for pain and visual disturbance.  Respiratory:  Positive for cough and shortness of breath.   Cardiovascular:  Negative for chest pain and palpitations.  Gastrointestinal:  Negative for abdominal pain and vomiting.  Genitourinary:  Negative for dysuria and hematuria.  Musculoskeletal:  Positive for myalgias. Negative for arthralgias and back pain.  Skin:  Negative for color change and rash.  Neurological:  Negative for seizures and syncope.  All other systems reviewed and are negative.  Physical Exam Updated Vital Signs BP 98/80   Pulse (!) 102   Temp 98.7 F (37.1 C) (Oral)   Resp (!) 33   Ht 5\' 11"  (1.803 m)   Wt 93 kg   SpO2 99%   BMI 28.59 kg/m   Physical Exam Vitals and nursing note reviewed.  Constitutional:      General: He is not in acute distress.    Appearance: He is well-developed.  HENT:      Head: Normocephalic and atraumatic.  Eyes:     Conjunctiva/sclera: Conjunctivae normal.     Pupils: Pupils are equal, round, and reactive to light.  Cardiovascular:     Rate and Rhythm: Regular rhythm. Tachycardia present.     Heart sounds: No murmur heard. Pulmonary:     Effort: Pulmonary effort is normal. Tachypnea present. No accessory muscle usage.     Breath sounds: Rhonchi present.  Abdominal:     General: There is no distension.     Palpations: Abdomen is soft.     Tenderness: There is no abdominal tenderness. There is no guarding.  Musculoskeletal:        General: No deformity or signs of injury.     Cervical back: Normal range of motion and neck supple.     Right lower leg: No edema.     Left lower leg: No edema.  Skin:    General: Skin is warm and dry.     Capillary Refill: Capillary refill takes less than 2 seconds.     Findings: No lesion or rash.  Neurological:     General: No focal deficit present.     Mental Status: He is alert and oriented to person, place, and time. Mental status is at baseline.    ED Results / Procedures / Treatments   Labs (all labs ordered are listed, but only abnormal results are displayed) Labs Reviewed  CBC WITH DIFFERENTIAL/PLATELET - Abnormal; Notable for the following components:      Result Value   WBC 1.9 (*)    Hemoglobin 12.7 (*)    HCT 37.2 (*)    Neutro Abs 0.9 (*)    Lymphs Abs 0.6 (*)    All other components within normal limits  BLOOD GAS, VENOUS - Abnormal; Notable for the following components:   pO2, Ven 26.8 (*)    Acid-Base Excess 2.5 (*)    All other components within normal limits  LACTIC ACID, PLASMA - Abnormal; Notable for the following components:   Lactic Acid, Venous 2.0 (*)    All other components within normal limits  COMPREHENSIVE METABOLIC PANEL - Abnormal; Notable for the following components:   Sodium 129 (*)    Chloride 91 (*)    Glucose,  Bld 112 (*)    Calcium 8.7 (*)    Albumin 2.5 (*)    All  other components within normal limits  PROTIME-INR - Abnormal; Notable for the following components:   Prothrombin Time 24.6 (*)    INR 2.2 (*)    All other components within normal limits  APTT - Abnormal; Notable for the following components:   aPTT 38 (*)    All other components within normal limits  URINALYSIS, ROUTINE W REFLEX MICROSCOPIC - Abnormal; Notable for the following components:   Ketones, ur 5 (*)    All other components within normal limits  I-STAT CHEM 8, ED - Abnormal; Notable for the following components:   Sodium 128 (*)    Chloride 92 (*)    Glucose, Bld 106 (*)    Calcium, Ion 1.06 (*)    Hemoglobin 12.6 (*)    HCT 37.0 (*)    All other components within normal limits  RESP PANEL BY RT-PCR (FLU A&B, COVID) ARPGX2  MRSA NEXT GEN BY PCR, NASAL  CULTURE, BLOOD (ROUTINE X 2)  CULTURE, BLOOD (ROUTINE X 2)  URINE CULTURE  BRAIN NATRIURETIC PEPTIDE  LACTIC ACID, PLASMA  TROPONIN I (HIGH SENSITIVITY)  TROPONIN I (HIGH SENSITIVITY)    EKG EKG Interpretation  Date/Time:  Monday December 22 2020 10:04:37 EST Ventricular Rate:  114 PR Interval:  152 QRS Duration: 102 QT Interval:  333 QTC Calculation: 459 R Axis:   -72 Text Interpretation: Sinus tachycardia Left anterior fascicular block Consider anterior infarct Confirmed by Regan Lemming ([id number]) on 12/22/2020 10:33:57 AM  Radiology CT Angio Chest PE W and/or Wo Contrast  Addendum Date: 12/22/2020   ADDENDUM REPORT: 12/22/2020 11:39 ADDENDUM: As mentioned in the body of the report, 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 Electronically Signed   By: Vinnie Langton M.D.   On: 12/22/2020 11:39   Result Date: 12/22/2020 CLINICAL DATA:  60 year old male with history of shortness of breath for 1 week. Evaluate for pulmonary embolism. EXAM: CT ANGIOGRAPHY CHEST WITH CONTRAST TECHNIQUE: Multidetector CT imaging of the chest was performed using the  standard protocol during bolus administration of intravenous contrast. Multiplanar CT image reconstructions and MIPs were obtained to evaluate the vascular anatomy. CONTRAST:  68mL OMNIPAQUE IOHEXOL 350 MG/ML SOLN COMPARISON:  Chest CT 10/06/2020. FINDINGS: Cardiovascular: Study is severely limited by a large amount of patient respiratory motion. With these limitations in mind, there is no central, lobar or proximal segmental sized filling defect to clearly indicate the presence of clinically significant pulmonary embolism. Smaller distal segmental and subsegmental sized pulmonary emboli cannot be entirely excluded. Heart size is normal. There is no significant pericardial fluid, thickening or pericardial calcification. There is aortic atherosclerosis, as well as atherosclerosis of the great vessels of the mediastinum and the coronary arteries, including calcified atherosclerotic plaque in the left anterior descending and right coronary arteries. Mediastinum/Nodes: Previously noted prominent but nonenlarged mediastinal lymph nodes appear stable compared to the prior examination. No definite pathologically enlarged mediastinal or hilar lymph nodes are noted. Esophagus is unremarkable in appearance. No axillary lymphadenopathy. Lungs/Pleura: Dense areas of airspace consolidation persist in the right upper and lower lobes. Interval cavitation of several areas of previously noted airspace consolidation in the right upper lobe posteriorly and in the superior segment of the right lower lobe. Today's study is most notable for widespread areas of ground-glass attenuation, septal thickening and regional areas of architectural distortion which have developed elsewhere  in the lungs bilaterally, most evident throughout the mid to upper lungs, concerning for progressive pneumonitis. No definite new suspicious appearing pulmonary nodules or masses are noted. Mild diffuse bronchial wall thickening with mild centrilobular and  paraseptal emphysema. Upper Abdomen: Large right adrenal mass increased compared to the prior study, currently measuring 6.4 x 3.7 cm, compatible with a large metastatic lesion. Spleen is incompletely imaged, but appears likely enlarged measuring at least 15.1 x 8.6 cm on axial images. Peripherally calcified gallstone in the neck of the gallbladder measuring 1.8 cm in diameter. Musculoskeletal: There are no aggressive appearing lytic or blastic lesions noted in the visualized portions of the skeleton. Review of the MIP images confirms the above findings. IMPRESSION: 1. Limited study demonstrating no definite evidence of central, lobar or proximal segmental sized pulmonary embolism. Distal segmental and subsegmental sized emboli are not entirely excluded. 2. Worsening aeration in the lungs bilaterally, the appearance of which is concerning for progressively worsening pneumonitis as can be seen in the setting of immunotherapy (patient is reportedly on Keytruda). Further clinical evaluation is recommended. Severe multilobar bilateral pneumonia could be considered, however, lack of response to antibiotics makes this less likely unless the causative agent is not being effectively treated. 3. Aortic atherosclerosis, in addition to two vessel coronary artery disease. Please note that although the presence of coronary artery calcium documents the presence of coronary artery disease, the severity of this disease and any potential stenosis cannot be assessed on this non-gated CT examination. Assessment for potential risk factor modification, dietary therapy or pharmacologic therapy may be warranted, if clinically indicated. 4. Mild diffuse bronchial wall thickening with mild centrilobular and paraseptal emphysema; imaging findings suggestive of underlying COPD. These results will be called to the ordering clinician or representative by the Radiologist Assistant, and communication documented in the PACS or [employer].  Aortic Atherosclerosis (ICD10-I70.0) and Emphysema (ICD10-J43.9). Electronically Signed: By: Vinnie Langton M.D. On: 12/22/2020 11:27   DG Chest Port 1 View  Result Date: 12/22/2020 CLINICAL DATA:  Shortness of breath, hypoxia EXAM: PORTABLE CHEST 1 VIEW COMPARISON:  12/18/2020 FINDINGS: No significant change in AP portable chest radiographs with diffuse bilateral interstitial heterogeneous airspace opacity, as well as a cavitary lesion of the right hand. No new airspace opacity. The heart and mediastinum are unremarkable. IMPRESSION: No significant change in AP portable chest radiographs with diffuse bilateral interstitial heterogeneous airspace opacity, as well as a cavitary lesion of the right hand. No new airspace opacity. Electronically Signed   By: Delanna Ahmadi M.D.   On: 12/22/2020 10:22    Procedures .Critical Care Performed by: Regan Lemming, MD Authorized by: Regan Lemming, MD   Critical care provider statement:    Critical care time (minutes):  44   Critical care was necessary to treat or prevent imminent or life-threatening deterioration of the following conditions:  Sepsis   Critical care was time spent personally by me on the following activities:  Blood draw for specimens, development of treatment plan with patient or surrogate, examination of patient, obtaining history from patient or surrogate, ordering and performing treatments and interventions, ordering and review of laboratory studies, ordering and review of radiographic studies, pulse oximetry and re-evaluation of patient's condition   Care discussed with: admitting provider     Medications Ordered in ED Medications  lactated ringers infusion ( Intravenous New Bag/Given 12/22/20 1026)  vancomycin (VANCOREADY) IVPB 1750 mg/350 mL (1,750 mg Intravenous New Bag/Given 12/22/20 1149)  methylPREDNISolone sodium succinate (SOLU-MEDROL) 125 mg/2 mL injection 125  mg (has no administration in time range)  ceFEPIme (MAXIPIME) 2 g  in sodium chloride 0.9 % 100 mL IVPB (0 g Intravenous Stopped 12/22/20 1055)  azithromycin (ZITHROMAX) 500 mg in sodium chloride 0.9 % 250 mL IVPB (0 mg Intravenous Stopped 12/22/20 1126)  lactated ringers bolus 1,000 mL (0 mLs Intravenous Stopped 12/22/20 1239)  iohexol (OMNIPAQUE) 350 MG/ML injection 80 mL (72 mLs Intravenous Contrast Given 12/22/20 1053)  lactated ringers bolus 1,000 mL (1,000 mLs Intravenous Bolus from Bag 12/22/20 1223)    ED Course  I have reviewed the triage vital signs and the nursing notes.  Pertinent labs & imaging results that were available during my care of the patient were reviewed by me and considered in my medical decision making (see chart for details).    MDM Rules/Calculators/A&P                           [name] presents with shortness of breath as per above.  His exam is most notable for tachypnea, tachycardia, and rhonchi.  Vitals on arrival significant for afebrile, temperature 98.7, sinus tachycardia noted on cardiac telemetry, pulse 119, tachypnea, respiratory rate 33, hemodynamically stable, blood pressure 103/83, O2 saturations hypoxic on room air to the mid 80s, subsequently placed on 10 L O2 via high flow nasal cannula.  My immediate concern is for a life-threatening infection in this patient. Evaluation and treatment for sepsis began at 1005.  The most likely infectious source is respiratory.  I have a low suspicion for meningitis, osteomyelitis, endocarditis, pyelonephritis, proctitis. Thorough skin exam revealed no significant open wounds.  Labs: Lactic acid elevated at 2.0, CBC with a leukopenia to 1.9, hemoglobin appears stable at 12.7, VBG without an acidosis, CMP with normal renal and liver function, COVID-19 and influenza PCR testing resulted negative.  Imaging: CTA imaging resulted without definitive evidence of central, lobar or proximal segmental pulmonary embolism, worsening aeration in the lungs bilaterally the appearance of  which is concerning for progressively worsening pneumonitis which can be seen in the setting of immunotherapy patient is a patient is on Keytruda.  Severe multilobar bilateral pneumonia could be considered.   For treatment, the patient was given 1L LR for IVF and vancomycin and cefepime for antimicrobials.  TIME Upon reassessment, the patient appears clinically unchanged.  He was hypoxic on high flow nasal cannula and was notably breathing through his mouth and was subsequently placed on a nonrebreather.  The patient is not currently hypotensive and has a MAP of >65. The patient's volume status appears to be improved after fluid resuscitation with improvement in his tachycardia to the low 100s.  On further reassessment, the patient maintained oxygen saturations in the high 90s after placement on a nonrebreather.  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  [SEVERE SEPSIS] HYPOTENSION (Nursing BPA for hypotension = Is this sepsis?) CREATININE > 2.0, or urine output < 0.5 mL/kg/hour for 2 hours  BILIRUBIN > 2 mg/dL (34.2 mmol/L)  PLATELET COUNT < 100,000  INR > 1.5 or aPTT > 60 sec  LACTATE > 2 mmol/L   [SEPTIC SHOCK] HYPOTENSION REFRACTORY TO 30 cc/kg OF FLUIDS LACTATE > 4 mmol/L  While in the ED, the patient was given: Medications  lactated ringers infusion ( Intravenous New Bag/Given 12/22/20 1026)  vancomycin (VANCOREADY) IVPB 1750 mg/350 mL (1,750 mg Intravenous New Bag/Given 12/22/20 1149)  methylPREDNISolone sodium succinate (SOLU-MEDROL) 125 mg/2 mL injection 125 mg (has no administration in time range)  ceFEPIme (MAXIPIME) 2 g in sodium chloride 0.9 % 100 mL IVPB (0 g Intravenous Stopped 12/22/20 1055)  azithromycin (ZITHROMAX) 500 mg in sodium chloride 0.9 % 250 mL IVPB (0 mg Intravenous Stopped 12/22/20 1126)  lactated ringers bolus 1,000 mL (0 mLs  Intravenous Stopped 12/22/20 1239)  iohexol (OMNIPAQUE) 350 MG/ML injection 80 mL (72 mLs Intravenous Contrast Given 12/22/20 1053)  lactated ringers bolus 1,000 mL (1,000 mLs Intravenous Bolus from Bag 12/22/20 1223)     [name] was admitted to the hospital for ongoing treatment and monitoring under [facility] status.   Final Clinical Impression(s) / ED Diagnoses Final diagnoses:  Acute respiratory failure with hypoxia ([place])  Pneumonitis    Rx / DC Orders ED Discharge Orders     None        Regan Lemming, MD 12/22/20 1247

## 2020-12-22 NOTE — Progress Notes (Signed)
Pharmacy Antibiotic Note  John [name] is a 60 y.o. male admitted on 12/22/2020 with sepsis.  Presented to the ED with dyspnea and worsening O2 requirement.  Patient with history of lung cancer with worsening pneumonitis.  Was started on Augmentin outpatient for PNA with no improvement in symptoms. Pharmacy has been consulted for cefepime dosing for sepsis.  Creatinine this morning 1.07 (this appears to be baseline) - creatinine from iSTAT obtained ~30 min after CMET with Scr 1.2 - likely falsely elevated.   WBC 1.9, ANC 900, LA 1.3  Received vancomycin and cefepime IV x1 in the ED. Discussed with CCM PA - will continue with cefepime only given negative MRSA PCR nasal swab.   Plan: Cefepime 2g IV q8 hours Monitor renal function, culture data  Height: 5\' 11"  (180.3 cm) Weight: 93 kg (205 lb) IBW/kg (Calculated) : 75.3  Temp (24hrs), Avg:98.7 F (37.1 C), Min:98.7 F (37.1 C), Max:98.7 F (37.1 C)  Recent Labs  Lab 12/22/20 1000 12/22/20 1003 12/22/20 1035 12/22/20 1234  WBC 1.9*  --   --   --   CREATININE  --  1.07 1.20  --   LATICACIDVEN  --  2.0*  --  1.3    Estimated Creatinine Clearance: 77.3 mL/min (by C-G formula based on SCr of 1.2 mg/dL).    Allergies  Allergen Reactions   Albuterol Other (See Comments)    Patient has had episode of atrial fib following administration of albuterol (tolerates Xopenex well)    Antimicrobials this admission: Vancomycin 11/7 >> 11/7 Cefepime 11/7 >>   Dose adjustments this admission:   Microbiology results: 11/7 BCx:  11/7 UCx:   11/7 MRSA PCR: negative   Thank you for allowing pharmacy to be a part of this patient's care.  Dimple Nanas, PharmD 12/22/2020 3:01 PM

## 2020-12-22 NOTE — H&P (Signed)
History and Physical    Truth Barot QQP:[id number] DOB: Jun 13, 1960 DOA: 12/22/2020  PCP: [name], MD   Patient coming from: Home.  I have personally briefly reviewed patient's old medical records in [place]  Chief Complaint: Shortness of breath.  HPI: John [name] is a 60 y.o. male with medical history significant of paroxysmal atrial fibrillation, aortic atherosclerosis, rheumatoid arthritis, GERD, stage IV squamous cell lung cancer who came into the emergency department due to progressively worse dyspnea for the past 2 weeks.  His initial O2 saturations were in the 70s and 80s.  He was placed on NRB oxygen and then high flow nasal cannula.  He was evaluated at the pulmonology clinic last week, prescribed Augmentin and prednisone but stopped taking the latter after 2 days due to having bad dreams.  He denied chest pain, but has had palpitations mild dizziness.  No PND, orthopnea or pitting edema of the lower extremities.  No abdominal pain, nausea or emesis, diarrhea, constipation, melena hematochezia.  No flank pain, dysuria or hematuria.  No polyuria, polydipsia, polyphagia or blurred vision.  ED Course: Initial vital signs were temperature 98.7 F, pulse 128, respiration 20, O2 sat 81% on nasal cannula oxygen.  The patient was given supplemental oxygen and empiric antibiotics.  I added Solu-Medrol 125 mg IVP x1.  Lab work: His urinalysis showed minimal ketonuria.  VBG showed PO2 of 27 mmHg and acid-base excess of 2.5 mmol/L.  CBC is her white count 1.9 with 44% neutrophils, hemoglobin 12.7 g/dL and platelets 294. Lactic acid was 2.0 and then 1.3 mmol/L.  Troponin and BNP were normal.  CMP showed a sodium 129 and chloride 91 mmol/L.  Glucose 112 mmol/L.  Renal function was normal.  LFTs were unremarkable except for an albumin of 2.5 g/dL.  Imaging: Significant for pneumonitis, coronary calcifications and aortic atherosclerosis.  Please see images and full radiology report  for further details.  Review of Systems: As per HPI otherwise all other systems reviewed and are negative.  Past Medical History:  Diagnosis Date   Dyspnea    Family history of adverse reaction to anesthesia    brother with seizures had episode under anesthesia.  Patient has seizures as well.   GERD (gastroesophageal reflux disease)    History of radiation therapy 04/24/2020-05/16/2020   IMRT to right lung     Dr [name]   PAF (paroxysmal atrial fibrillation) ([place])    CHADS2VSAC score 0   Rheumatoid aortitis    Squamous cell lung cancer [facility])     Past Surgical History:  Procedure Laterality Date   BRONCHIAL BRUSHINGS  03/27/2020   Procedure: BRONCHIAL BRUSHINGS;  Surgeon: [name], MD;  Location: [place];  Service: Cardiopulmonary;;   BUBBLE STUDY  10/13/2020   Procedure: BUBBLE STUDY;  Surgeon: Pixie Casino, MD;  Location: [place];  Service: Cardiovascular;;   CARDIOVERSION N/A 10/13/2020   Procedure: CARDIOVERSION;  Surgeon: Pixie Casino, MD;  Location: [facility] ENDOSCOPY;  Service: Cardiovascular;  Laterality: N/A;   CATARACT EXTRACTION  2016   at [place]  03/27/2020   Procedure: FINE NEEDLE ASPIRATION;  Surgeon: [name], MD;  Location: [place];  Service: Cardiopulmonary;;   HIP SURGERY Left    TEE WITHOUT CARDIOVERSION N/A 10/13/2020   Procedure: TRANSESOPHAGEAL ECHOCARDIOGRAM (TEE);  Surgeon: Pixie Casino, MD;  Location: [place];  Service: Cardiovascular;  Laterality: N/A;   VIDEO BRONCHOSCOPY WITH ENDOBRONCHIAL ULTRASOUND N/A 03/27/2020   Procedure: VIDEO  BRONCHOSCOPY WITH ENDOBRONCHIAL ULTRASOUND;  Surgeon: [name], MD;  Location: [facility] ENDOSCOPY;  Service: Cardiopulmonary;  Laterality: N/A;   Social History  reports that he quit smoking about 4 years ago. His smoking use included cigarettes. He has a 30.00 pack-year smoking history. He has never used smokeless tobacco. He reports that he does not currently  use alcohol. He reports that he does not use drugs.  Allergies  Allergen Reactions   Albuterol Other (See Comments)    Patient has had episode of atrial fib following administration of albuterol (tolerates Xopenex well)    Family History  Problem Relation Age of Onset   Arrhythmia Mother        has PPM   Heart disease Father        Died at 31, started in his 65s, heart attacks, had PPM and ICD   Prior to Admission medications   Medication Sig Start Date End Date Taking? Authorizing Provider  amiodarone (PACERONE) 200 MG tablet Take 1 tablet (200 mg total) by mouth daily. Patient taking differently: Take 200 mg by mouth every evening. 12/16/20 01/15/21 Yes Fenton, Clint R, PA  amoxicillin-clavulanate (AUGMENTIN) 875-125 MG tablet Take 1 tablet by mouth 2 (two) times daily. 12/18/20  Yes Lauraine Rinne, NP  levalbuterol [facility] HFA) 45 MCG/ACT inhaler Inhale 2 puffs into the lungs every 4 (four) hours as needed for wheezing. 06/11/20  Yes [name], MD  metoprolol succinate (TOPROL XL) 25 MG 24 hr tablet Take 1 tablet (25 mg total) by mouth daily. Patient taking differently: Take 25 mg by mouth at bedtime. 11/19/20  Yes Imogene Burn, PA-C  PARoxetine (PAXIL) 10 MG tablet Take 10 mg by mouth daily. 01/25/20  Yes [provider]  pembrolizumab (KEYTRUDA) 100 MG/4ML SOLN Inject 2 mg/kg into the vein every 21 ( twenty-one) days.   Yes [provider]  predniSONE (DELTASONE) 5 MG tablet Take 5 mg by mouth daily. 09/01/20  Yes [provider]  PRILOSEC OTC 20 MG tablet Take 20 mg by mouth at bedtime. 11/13/19  Yes [provider]  rivaroxaban (XARELTO) 20 MG TABS tablet Take 1 tablet (20 mg total) by mouth daily with supper. 11/13/20 02/11/21 Yes Turner, Eber Hong, MD  atorvastatin (LIPITOR) 10 MG tablet Take 1 tablet (10 mg total) by mouth daily. 11/19/20 02/17/21  Imogene Burn, PA-C  predniSONE (DELTASONE) 10 MG tablet Take 4 tablets (40 mg total) by mouth  daily with breakfast for 3 days, THEN 3 tablets (30 mg total) daily with breakfast for 3 days, THEN 2 tablets (20 mg total) daily with breakfast for 3 days, THEN 1 tablet (10 mg total) daily with breakfast for 3 days. Patient not taking: Reported on 12/22/2020 12/18/20 12/30/20  Lauraine Rinne, NP  Tiotropium Bromide-Olodaterol (STIOLTO RESPIMAT) 2.5-2.5 MCG/ACT AERS Inhale 2 puffs into the lungs daily. 12/18/20   Lauraine Rinne, NP    Physical Exam: Vitals:   12/22/20 1200 12/22/20 1430 12/22/20 1513 12/22/20 1845  BP: 98/80 109/71    Pulse: (!) 102 98    Resp: (!) 33 16    Temp:    (!) 97 F (36.1 C)  TempSrc:    Oral  SpO2: 99% 93% 97%   Weight:      Height:       Constitutional: Chronically ill-appearing.  NAD, calm, comfortable Eyes: PERRL, lids and conjunctivae normal.  Injected sclera bilaterally. ENMT: High flow nasal cannula at 15 LPM.  Mucous membranes are  moist. Posterior pharynx clear of any exudate or lesions. Neck: normal, supple, no masses, no thyromegaly Respiratory: Tachypneic in the mid to high 20s, positive bilateral wheezing, bibasilar crackles.  No accessory muscle use.  Cardiovascular: Sinus tachycardia in the low 100s, no murmurs / rubs / gallops. No extremity edema. 2+ pedal pulses. No carotid bruits.  Abdomen: Obese, no distention.  Soft, no tenderness, no masses palpated. No hepatosplenomegaly. Bowel sounds positive.  Musculoskeletal: no clubbing / cyanosis. Good ROM, no contractures. Normal muscle tone.  Skin: no acute rashes, lesions, ulcers on very limited dermatological examination. Neurologic: CN 2-12 grossly intact. Sensation intact, DTR normal. Strength 5/5 in all 4.  Psychiatric: Normal judgment and insight. Alert and oriented x 3. Normal mood.   Labs on Admission: I have personally reviewed following labs and imaging studies  CBC: Recent Labs  Lab 12/22/20 1000 12/22/20 1035  WBC 1.9*  --   NEUTROABS 0.9*  --   HGB 12.7* 12.6*  HCT 37.2* 37.0*   MCV 84.5  --   PLT 294  --     Basic Metabolic Panel: Recent Labs  Lab 12/22/20 1003 12/22/20 1035  NA 129* 128*  K 4.1 4.7  CL 91* 92*  CO2 27  --   GLUCOSE 112* 106*  BUN 16 19  CREATININE 1.07 1.20  CALCIUM 8.7*  --     GFR: Estimated Creatinine Clearance: 77.3 mL/min (by C-G formula based on SCr of 1.2 mg/dL).  Liver Function Tests: Recent Labs  Lab 12/22/20 1003  AST 33  ALT 18  ALKPHOS 68  BILITOT 1.0  PROT 7.0  ALBUMIN 2.5*   Urine analysis:    Component Value Date/Time   COLORURINE YELLOW 12/22/2020 1213   APPEARANCEUR CLEAR 12/22/2020 1213   LABSPEC 1.029 12/22/2020 1213   PHURINE 6.0 12/22/2020 1213   GLUCOSEU NEGATIVE 12/22/2020 1213   HGBUR NEGATIVE 12/22/2020 1213   BILIRUBINUR NEGATIVE 12/22/2020 1213   KETONESUR 5 (A) 12/22/2020 1213   PROTEINUR NEGATIVE 12/22/2020 1213   NITRITE NEGATIVE 12/22/2020 1213   LEUKOCYTESUR NEGATIVE 12/22/2020 1213   Radiological Exams on Admission: CT Angio Chest PE W and/or Wo Contrast  Addendum Date: 12/22/2020   ADDENDUM REPORT: 12/22/2020 11:39 ADDENDUM: As mentioned in the body of the report, 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 Electronically Signed   By: Vinnie Langton M.D.   On: 12/22/2020 11:39   Result Date: 12/22/2020 CLINICAL DATA:  60 year old male with history of shortness of breath for 1 week. Evaluate for pulmonary embolism. EXAM: CT ANGIOGRAPHY CHEST WITH CONTRAST TECHNIQUE: Multidetector CT imaging of the chest was performed using the standard protocol during bolus administration of intravenous contrast. Multiplanar CT image reconstructions and MIPs were obtained to evaluate the vascular anatomy. CONTRAST:  82mL OMNIPAQUE IOHEXOL 350 MG/ML SOLN COMPARISON:  Chest CT 10/06/2020. FINDINGS: Cardiovascular: Study is severely limited by a large amount of patient respiratory motion. With these limitations in mind, there is no central,  lobar or proximal segmental sized filling defect to clearly indicate the presence of clinically significant pulmonary embolism. Smaller distal segmental and subsegmental sized pulmonary emboli cannot be entirely excluded. Heart size is normal. There is no significant pericardial fluid, thickening or pericardial calcification. There is aortic atherosclerosis, as well as atherosclerosis of the great vessels of the mediastinum and the coronary arteries, including calcified atherosclerotic plaque in the left anterior descending and right coronary arteries. Mediastinum/Nodes: Previously noted prominent but nonenlarged mediastinal lymph nodes  appear stable compared to the prior examination. No definite pathologically enlarged mediastinal or hilar lymph nodes are noted. Esophagus is unremarkable in appearance. No axillary lymphadenopathy. Lungs/Pleura: Dense areas of airspace consolidation persist in the right upper and lower lobes. Interval cavitation of several areas of previously noted airspace consolidation in the right upper lobe posteriorly and in the superior segment of the right lower lobe. Today's study is most notable for widespread areas of ground-glass attenuation, septal thickening and regional areas of architectural distortion which have developed elsewhere in the lungs bilaterally, most evident throughout the mid to upper lungs, concerning for progressive pneumonitis. No definite new suspicious appearing pulmonary nodules or masses are noted. Mild diffuse bronchial wall thickening with mild centrilobular and paraseptal emphysema. Upper Abdomen: Large right adrenal mass increased compared to the prior study, currently measuring 6.4 x 3.7 cm, compatible with a large metastatic lesion. Spleen is incompletely imaged, but appears likely enlarged measuring at least 15.1 x 8.6 cm on axial images. Peripherally calcified gallstone in the neck of the gallbladder measuring 1.8 cm in diameter. Musculoskeletal: There are  no aggressive appearing lytic or blastic lesions noted in the visualized portions of the skeleton. Review of the MIP images confirms the above findings. IMPRESSION: 1. Limited study demonstrating no definite evidence of central, lobar or proximal segmental sized pulmonary embolism. Distal segmental and subsegmental sized emboli are not entirely excluded. 2. Worsening aeration in the lungs bilaterally, the appearance of which is concerning for progressively worsening pneumonitis as can be seen in the setting of immunotherapy (patient is reportedly on Keytruda). Further clinical evaluation is recommended. Severe multilobar bilateral pneumonia could be considered, however, lack of response to antibiotics makes this less likely unless the causative agent is not being effectively treated. 3. Aortic atherosclerosis, in addition to two vessel coronary artery disease. Please note that although the presence of coronary artery calcium documents the presence of coronary artery disease, the severity of this disease and any potential stenosis cannot be assessed on this non-gated CT examination. Assessment for potential risk factor modification, dietary therapy or pharmacologic therapy may be warranted, if clinically indicated. 4. Mild diffuse bronchial wall thickening with mild centrilobular and paraseptal emphysema; imaging findings suggestive of underlying COPD. These results will be called to the ordering clinician or representative by the Radiologist Assistant, and communication documented in the PACS or [employer]. Aortic Atherosclerosis (ICD10-I70.0) and Emphysema (ICD10-J43.9). Electronically Signed: By: Vinnie Langton M.D. On: 12/22/2020 11:27   DG Chest Port 1 View  Result Date: 12/22/2020 CLINICAL DATA:  Shortness of breath, hypoxia EXAM: PORTABLE CHEST 1 VIEW COMPARISON:  12/18/2020 FINDINGS: No significant change in AP portable chest radiographs with diffuse bilateral interstitial heterogeneous airspace  opacity, as well as a cavitary lesion of the right hand. No new airspace opacity. The heart and mediastinum are unremarkable. IMPRESSION: No significant change in AP portable chest radiographs with diffuse bilateral interstitial heterogeneous airspace opacity, as well as a cavitary lesion of the right hand. No new airspace opacity. Electronically Signed   By: Delanna Ahmadi M.D.   On: 12/22/2020 10:22    10/13/2020 TEE  IMPRESSIONS:  1. Left ventricular ejection fraction, by estimation, is 60 to 65%. The  left ventricle has normal function.   2. Right ventricular systolic function is normal. The right ventricular  size is normal.   3. No left atrial/left atrial appendage thrombus was detected.   4. The mitral valve is grossly normal. Trivial mitral valve  regurgitation.   5. The aortic valve  is tricuspid. Aortic valve regurgitation is not  visualized.   6. Evidence of atrial level shunting detected by color flow Doppler.  Agitated saline contrast bubble study was positive with shunting observed  within 3-6 cardiac cycles suggestive of interatrial shunt. There is a  small patent foramen ovale with  bidirectional shunting across atrial septum.   EKG: Independently reviewed.  Vent. rate 114 BPM PR interval 152 ms QRS duration 102 ms QT/QTcB 333/459 ms P-R-T axes 43 -72 75 Sinus tachycardia Left anterior fascicular block Consider anterior infarct  Assessment/Plan Principal Problem:   Acute on chronic respiratory failure with hypoxia (HCC) In the setting of   Keytruda induced pneumonitis Also history of   COPD with asthma ([place]) Observation/stepdown. Continue HFNC. Xopenex plus Atrovent nebs. Continue empiric antibiotics. (Zithromax and cefepime) Daily methylprednisolone 125 mg IVP. Anoro once daily added by pulmonology. No extended BiPAP use if no quick response to glucocorticoids.  Active Problems:   Leukocytopenia Monitor WBC. Consider hematology consult.    Stage IV  squamous cell carcinoma of right lung [facility]) Follow-up with oncology.    Hyponatremia Received IV fluids in the ED. Fluid restriction if needed. Follow-up sodium level in the morning.    PAF (paroxysmal atrial fibrillation) (HCC) CHA?DS?-VASc Score of at least 1. (Aortic atherosclerosis). Continue amiodarone 200 mg p.o. twice daily. Continue Xarelto 20 mg p.o. every evening.    DVT prophylaxis: On Xarelto. Code Status:   Full code. Family Communication:   Disposition Plan:   Patient is from:  Home.  Anticipated DC to:  TBD.  Anticipated DC date:  At least 3 to 4 days.  Anticipated DC barriers: Clinical status. Consults called:  Dr. Noemi Chapel (PCCM). Admission status:  Stepdown/observation.    Severity of Illness:High severity after presenting with acute hypoxic respiratory failure in the setting of pneumonitis likely due to [facility].  The patient will be admitted for close monitoring, respiratory support, empiric antibiotics and glucocorticoids.  Reubin Milan MD [facility]  How to contact the [facility] Attending or Consulting provider [place] or covering provider during after hours [place], for this patient?   Check the care team in [facility] and look for a) attending/consulting [facility] provider listed and b) the [facility] team listed Log into [email or web address] and use [facility]'s universal password to access. If you do not have the password, please contact the hospital operator. Locate the [facility] provider you are looking for under [facility] and page to a number that you can be directly reached. If you still have difficulty reaching the provider, please page the [facility] (Director on Call) for the Hospitalists listed on amion for assistance.  12/22/2020, 7:11 PM

## 2020-12-23 DIAGNOSIS — R0602 Shortness of breath: Secondary | ICD-10-CM | POA: Diagnosis present

## 2020-12-23 DIAGNOSIS — T50905A Adverse effect of unspecified drugs, medicaments and biological substances, initial encounter: Secondary | ICD-10-CM | POA: Diagnosis not present

## 2020-12-23 DIAGNOSIS — R6520 Severe sepsis without septic shock: Secondary | ICD-10-CM | POA: Diagnosis present

## 2020-12-23 DIAGNOSIS — I4891 Unspecified atrial fibrillation: Secondary | ICD-10-CM

## 2020-12-23 DIAGNOSIS — J189 Pneumonia, unspecified organism: Secondary | ICD-10-CM | POA: Diagnosis not present

## 2020-12-23 DIAGNOSIS — I2510 Atherosclerotic heart disease of native coronary artery without angina pectoris: Secondary | ICD-10-CM | POA: Diagnosis present

## 2020-12-23 DIAGNOSIS — J9601 Acute respiratory failure with hypoxia: Secondary | ICD-10-CM

## 2020-12-23 DIAGNOSIS — C3491 Malignant neoplasm of unspecified part of right bronchus or lung: Secondary | ICD-10-CM | POA: Diagnosis not present

## 2020-12-23 DIAGNOSIS — I480 Paroxysmal atrial fibrillation: Secondary | ICD-10-CM | POA: Diagnosis present

## 2020-12-23 DIAGNOSIS — F419 Anxiety disorder, unspecified: Secondary | ICD-10-CM | POA: Diagnosis present

## 2020-12-23 DIAGNOSIS — Z87891 Personal history of nicotine dependence: Secondary | ICD-10-CM

## 2020-12-23 DIAGNOSIS — J704 Drug-induced interstitial lung disorders, unspecified: Secondary | ICD-10-CM | POA: Diagnosis present

## 2020-12-23 DIAGNOSIS — F32A Depression, unspecified: Secondary | ICD-10-CM | POA: Diagnosis present

## 2020-12-23 DIAGNOSIS — A419 Sepsis, unspecified organism: Secondary | ICD-10-CM | POA: Diagnosis present

## 2020-12-23 DIAGNOSIS — C3411 Malignant neoplasm of upper lobe, right bronchus or lung: Secondary | ICD-10-CM | POA: Diagnosis present

## 2020-12-23 DIAGNOSIS — Z20822 Contact with and (suspected) exposure to covid-19: Secondary | ICD-10-CM | POA: Diagnosis present

## 2020-12-23 DIAGNOSIS — I444 Left anterior fascicular block: Secondary | ICD-10-CM | POA: Diagnosis present

## 2020-12-23 DIAGNOSIS — T797XXA Traumatic subcutaneous emphysema, initial encounter: Secondary | ICD-10-CM | POA: Diagnosis not present

## 2020-12-23 DIAGNOSIS — E871 Hypo-osmolality and hyponatremia: Secondary | ICD-10-CM | POA: Diagnosis present

## 2020-12-23 DIAGNOSIS — T451X5A Adverse effect of antineoplastic and immunosuppressive drugs, initial encounter: Secondary | ICD-10-CM | POA: Diagnosis present

## 2020-12-23 DIAGNOSIS — R062 Wheezing: Secondary | ICD-10-CM | POA: Diagnosis not present

## 2020-12-23 DIAGNOSIS — C7970 Secondary malignant neoplasm of unspecified adrenal gland: Secondary | ICD-10-CM | POA: Diagnosis present

## 2020-12-23 DIAGNOSIS — D709 Neutropenia, unspecified: Secondary | ICD-10-CM | POA: Diagnosis present

## 2020-12-23 DIAGNOSIS — Z6828 Body mass index (BMI) 28.0-28.9, adult: Secondary | ICD-10-CM | POA: Diagnosis not present

## 2020-12-23 DIAGNOSIS — I951 Orthostatic hypotension: Secondary | ICD-10-CM | POA: Diagnosis not present

## 2020-12-23 DIAGNOSIS — M0530 Rheumatoid heart disease with rheumatoid arthritis of unspecified site: Secondary | ICD-10-CM | POA: Diagnosis present

## 2020-12-23 DIAGNOSIS — J432 Centrilobular emphysema: Secondary | ICD-10-CM | POA: Diagnosis not present

## 2020-12-23 DIAGNOSIS — I700 Atherosclerosis of aorta: Secondary | ICD-10-CM | POA: Diagnosis present

## 2020-12-23 DIAGNOSIS — D469 Myelodysplastic syndrome, unspecified: Secondary | ICD-10-CM | POA: Diagnosis present

## 2020-12-23 DIAGNOSIS — J984 Other disorders of lung: Secondary | ICD-10-CM | POA: Diagnosis present

## 2020-12-23 DIAGNOSIS — E785 Hyperlipidemia, unspecified: Secondary | ICD-10-CM | POA: Diagnosis present

## 2020-12-23 DIAGNOSIS — J449 Chronic obstructive pulmonary disease, unspecified: Secondary | ICD-10-CM | POA: Diagnosis present

## 2020-12-23 DIAGNOSIS — J9621 Acute and chronic respiratory failure with hypoxia: Secondary | ICD-10-CM | POA: Diagnosis present

## 2020-12-23 DIAGNOSIS — D702 Other drug-induced agranulocytosis: Secondary | ICD-10-CM | POA: Diagnosis not present

## 2020-12-23 DIAGNOSIS — M069 Rheumatoid arthritis, unspecified: Secondary | ICD-10-CM | POA: Diagnosis present

## 2020-12-23 LAB — CBC
HCT: 30.6 % — ABNORMAL LOW (ref 39.0–52.0)
Hemoglobin: 10.3 g/dL — ABNORMAL LOW (ref 13.0–17.0)
MCH: 28.7 pg (ref 26.0–34.0)
MCHC: 33.7 g/dL (ref 30.0–36.0)
MCV: 85.2 fL (ref 80.0–100.0)
Platelets: 260 10*3/uL (ref 150–400)
RBC: 3.59 MIL/uL — ABNORMAL LOW (ref 4.22–5.81)
RDW: 14.5 % (ref 11.5–15.5)
WBC: 1.5 10*3/uL — ABNORMAL LOW (ref 4.0–10.5)
nRBC: 0 % (ref 0.0–0.2)

## 2020-12-23 LAB — COMPREHENSIVE METABOLIC PANEL
ALT: 15 U/L (ref 0–44)
AST: 27 U/L (ref 15–41)
Albumin: 2.3 g/dL — ABNORMAL LOW (ref 3.5–5.0)
Alkaline Phosphatase: 58 U/L (ref 38–126)
Anion gap: 8 (ref 5–15)
BUN: 20 mg/dL (ref 6–20)
CO2: 25 mmol/L (ref 22–32)
Calcium: 8.3 mg/dL — ABNORMAL LOW (ref 8.9–10.3)
Chloride: 98 mmol/L (ref 98–111)
Creatinine, Ser: 0.97 mg/dL (ref 0.61–1.24)
GFR, Estimated: >60 mL/min (ref >60)
Glucose, Bld: 168 mg/dL — ABNORMAL HIGH (ref 70–99)
Potassium: 4.3 mmol/L (ref 3.5–5.1)
Sodium: 131 mmol/L — ABNORMAL LOW (ref 135–145)
Total Bilirubin: 0.7 mg/dL (ref 0.3–1.2)
Total Protein: 6.1 g/dL — ABNORMAL LOW (ref 6.5–8.1)

## 2020-12-23 LAB — URINE CULTURE: Culture: NO GROWTH

## 2020-12-23 LAB — PROCALCITONIN: Procalcitonin: 0.39 ng/mL

## 2020-12-23 MED ORDER — ORAL CARE MOUTH RINSE
15.0000 mL | Freq: Two times a day (BID) | OROMUCOSAL | Status: DC
  Administered 2020-12-23 – 2020-12-31 (×16): 15 mL via OROMUCOSAL

## 2020-12-23 MED ORDER — LEVALBUTEROL HCL 0.63 MG/3ML IN NEBU
0.6300 mg | INHALATION_SOLUTION | Freq: Four times a day (QID) | RESPIRATORY_TRACT | Status: DC
  Administered 2020-12-24 – 2020-12-25 (×7): 0.63 mg via RESPIRATORY_TRACT
  Filled 2020-12-23 (×8): qty 3

## 2020-12-23 MED ORDER — ALBUMIN HUMAN 5 % IV SOLN
12.5000 g | Freq: Once | INTRAVENOUS | Status: AC
  Administered 2020-12-23: 12.5 g via INTRAVENOUS
  Filled 2020-12-23: qty 250

## 2020-12-23 MED ORDER — LACTATED RINGERS IV SOLN: INTRAVENOUS | Status: DC

## 2020-12-23 MED ORDER — ITRACONAZOLE 10 MG/ML PO SOLN
200.0000 mg | Freq: Every day | ORAL | Status: DC
  Administered 2020-12-23 – 2020-12-25 (×3): 200 mg via ORAL
  Filled 2020-12-23 (×5): qty 20

## 2020-12-23 MED ORDER — APIXABAN 2.5 MG PO TABS
2.5000 mg | ORAL_TABLET | Freq: Two times a day (BID) | ORAL | Status: DC
  Administered 2020-12-23 – 2021-01-01 (×18): 2.5 mg via ORAL
  Filled 2020-12-23 (×18): qty 1

## 2020-12-23 MED ORDER — IPRATROPIUM BROMIDE 0.02 % IN SOLN
0.5000 mg | Freq: Four times a day (QID) | RESPIRATORY_TRACT | Status: DC
  Administered 2020-12-24 – 2020-12-25 (×7): 0.5 mg via RESPIRATORY_TRACT
  Filled 2020-12-23 (×8): qty 2.5

## 2020-12-23 MED ORDER — ACYCLOVIR 400 MG PO TABS
800.0000 mg | ORAL_TABLET | Freq: Two times a day (BID) | ORAL | Status: DC
  Administered 2020-12-23 – 2021-01-01 (×19): 800 mg via ORAL
  Filled 2020-12-23 (×2): qty 2
  Filled 2020-12-23: qty 1
  Filled 2020-12-23: qty 2
  Filled 2020-12-23: qty 1
  Filled 2020-12-23 (×12): qty 2
  Filled 2020-12-23: qty 1
  Filled 2020-12-23 (×2): qty 2
  Filled 2020-12-23: qty 1
  Filled 2020-12-23 (×3): qty 2

## 2020-12-23 MED ORDER — ITRACONAZOLE 100 MG PO CAPS: 200.0000 mg | ORAL_CAPSULE | Freq: Every day | ORAL | Status: DC

## 2020-12-23 MED ORDER — METHYLPREDNISOLONE SODIUM SUCC 125 MG IJ SOLR
125.0000 mg | Freq: Two times a day (BID) | INTRAMUSCULAR | Status: DC
  Administered 2020-12-23 – 2020-12-25 (×5): 125 mg via INTRAVENOUS
  Filled 2020-12-23 (×5): qty 2

## 2020-12-23 NOTE — Progress Notes (Signed)
[facility] Progress Note Patient Name: Jillian Pianka DOB: Feb 18, 1960 MRN: [id number]   Date of Service  12/23/2020  HPI/Events of Note  BP trending down, currently 86/50, MAP 61. Serum albumin 2.3 gm / dl.  eICU Interventions  Albumin 5 % 12.5 gm iv x 1 ordered.        Kerry Kass Henchy Mccauley 12/23/2020, 11:02 PM

## 2020-12-23 NOTE — Assessment & Plan Note (Signed)
Sodium 131.  Likely from poor p.o. intake.  Monitor.  Improving from 129.

## 2020-12-23 NOTE — Consult Note (Signed)
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  HPI: John [name] is well-known to me.  He is nice 60 year old white male.  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  He wastried on chemotherapy which she had a very hard time with.  He now is on immunotherapy.  He is on Keytruda.  He had 11 cycles to date.  Last chemotherapy was given on 12/15/2020.  So far, he has done well with it.  He does have atrial fibrillation.  He has been on amiodarone for this.  He began more dyspneic over the weekend.  He had no fever.  He had a cough.  It was not productive.  There is no hemoptysis.  He has some diarrhea.  He had no skin rash.  There was no nausea or vomiting.  He ultimately came to the emergency room.  He had a CT angiogram of the chest.  This was on 12/22/2020.  He had worsening aeration of lungs bilaterally.  His thought this was likely pneumonitis.  He had an enlarging adrenal metastasis.  I think one of the problem is that he has had profound neutropenia.  This likely is from rheumatoid arthritis and some splenomegaly.  Today, his white count is 1.5.  Hemoglobin 10.3.  Platelet count 260,000.  His BUN is 20 creatinine 0.97.  His albumin is 2.3.  Cultures were taken.  He had a viral respiratory panel done.  This was unremarkable.  He was negative for COVID.  He is on broad-spectrum antibiotic coverage.  He is feeling better.  He is also on steroids.  Steroids you have the side effect of causing bad dreams.  Unfortunately, I do not think we have any other option outside to using steroids.  He said he slept well last night.  He says appetite is doing okay.  Currently, I would have to say his performance status is probably ECOG 1.    Past Medical History:  Diagnosis Date   Dyspnea    Family  history of adverse reaction to anesthesia    brother with seizures had episode under anesthesia.  Patient has seizures as well.   GERD (gastroesophageal reflux disease)    History of radiation therapy 04/24/2020-05/16/2020   IMRT to right lung     Dr [name]   PAF (paroxysmal atrial fibrillation) ([place])    CHADS2VSAC score 0   Rheumatoid aortitis    Squamous cell lung cancer ([place])   :   Past Surgical History:  Procedure Laterality Date   BRONCHIAL BRUSHINGS  03/27/2020   Procedure: BRONCHIAL BRUSHINGS;  Surgeon: [name], MD;  Location: [place];  Service: Cardiopulmonary;;   BUBBLE STUDY  10/13/2020   Procedure: BUBBLE STUDY;  Surgeon: Pixie Casino, MD;  Location: [place];  Service: Cardiovascular;;   CARDIOVERSION N/A 10/13/2020   Procedure: CARDIOVERSION;  Surgeon: Pixie Casino, MD;  [facility] ENDOSCOPY;  Service: Cardiovascular;  Laterality: N/A;   CATARACT EXTRACTION  2016   at [facility]  03/27/2020   Procedure: FINE NEEDLE ASPIRATION;  Surgeon: [name], MD;  Location: [place];  Service: Cardiopulmonary;;   HIP SURGERY Left    TEE WITHOUT CARDIOVERSION N/A 10/13/2020   Procedure: TRANSESOPHAGEAL ECHOCARDIOGRAM (TEE);  Surgeon: Pixie Casino, MD;  Location: [facility] ENDOSCOPY;  Service: Cardiovascular;  Laterality: N/A;   VIDEO BRONCHOSCOPY WITH ENDOBRONCHIAL ULTRASOUND N/A 03/27/2020   Procedure: VIDEO BRONCHOSCOPY WITH ENDOBRONCHIAL ULTRASOUND;  Surgeon: [name], MD;  Location: [place];  Service: Cardiopulmonary;  Laterality: N/A;  :   Current Facility-Administered Medications:    acetaminophen (TYLENOL) tablet 650 mg, 650 mg, Oral, Q6H PRN **OR** acetaminophen (TYLENOL) suppository 650 mg, 650 mg, Rectal, Q6H PRN, Reubin Milan, MD   amiodarone (PACERONE) tablet 200 mg, 200 mg, Oral, QPM, Reubin Milan, MD, 200 mg at 12/22/20 2030   atorvastatin (LIPITOR) tablet 10 mg, 10 mg, Oral, Daily, Reubin Milan, MD   azithromycin [facility]) tablet 500 mg, 500 mg, Oral, Daily, Carlis Abbott, Laura P, DO   ceFEPIme (MAXIPIME) 2 g in sodium chloride 0.9 % 100 mL IVPB, 2 g, Intravenous, Q8H, Dimple Nanas, RPH, Stopped at 12/23/20 0246   Chlorhexidine Gluconate Cloth 2 % PADS 6 each, 6 each, Topical, Daily, Reubin Milan, MD   ipratropium (ATROVENT) nebulizer solution 0.5 mg, 0.5 mg, Nebulization, Q6H, Reubin Milan, MD, 0.5 mg at 12/23/20 0116   lactated ringers infusion, , Intravenous, Continuous, Ogan, Kerry Kass, MD, Last Rate: 250 mL/hr at 12/23/20 0500, Infusion Verify at 12/23/20 0500   levalbuterol (XOPENEX) nebulizer solution 0.63 mg, 0.63 mg, Nebulization, Q6H PRN, Reubin Milan, MD   levalbuterol Penne Lash) nebulizer solution 0.63 mg, 0.63 mg, Nebulization, Q6H, Reubin Milan, MD, 0.63 mg at 12/23/20 0116   methylPREDNISolone sodium succinate (SOLU-MEDROL) 125 mg/2 mL injection 125 mg, 125 mg, Intravenous, Daily, Noemi Chapel P, DO   metoprolol succinate (TOPROL-XL) 24 hr tablet 25 mg, 25 mg, Oral, QHS, Reubin Milan, MD, 25 mg at 12/22/20 2031   ondansetron (ZOFRAN) tablet 4 mg, 4 mg, Oral, Q6H PRN **OR** ondansetron (ZOFRAN) injection 4 mg, 4 mg, Intravenous, Q6H PRN, Reubin Milan, MD   pantoprazole (PROTONIX) EC tablet 40 mg, 40 mg, Oral, QHS, Reubin Milan, MD, 40 mg at 12/22/20 2031   PARoxetine (PAXIL) tablet 10 mg, 10 mg, Oral, Daily, Reubin Milan, MD   rivaroxaban Alveda Reasons) tablet 20 mg, 20 mg, Oral, Q supper, Reubin Milan, MD, 20 mg at 12/22/20 1808   umeclidinium-vilanterol (ANORO ELLIPTA) 62.5-25 MCG/ACT 1 puff, 1 puff, Inhalation, Daily, Noemi Chapel P, DO:   amiodarone  200 mg Oral QPM   atorvastatin  10 mg Oral Daily   azithromycin  500 mg Oral Daily   Chlorhexidine Gluconate Cloth  6 each Topical Daily   ipratropium  0.5 mg Nebulization Q6H   levalbuterol  0.63 mg Nebulization Q6H   methylPREDNISolone  (SOLU-MEDROL) injection  125 mg Intravenous Daily   metoprolol succinate  25 mg Oral QHS   pantoprazole  40 mg Oral QHS   PARoxetine  10 mg Oral Daily   rivaroxaban  20 mg Oral Q supper   umeclidinium-vilanterol  1 puff Inhalation Daily  :   Allergies  Allergen Reactions   Albuterol Other (See Comments)    Patient has had episode of atrial fib following administration of albuterol (tolerates Xopenex well)  :   Family History  Problem Relation Age of Onset   Arrhythmia Mother        has PPM   Heart disease Father        Died at 40, started in his 77s, heart attacks, had PPM and ICD  :   Social History   Socioeconomic History   Marital status: Married  Spouse name: Not on file   Number of children: Not on file   Years of education: Not on file   Highest education level: Not on file  Occupational History   Not on file  Tobacco Use   Smoking status: Former    Packs/day: 1.00    Years: 30.00    Pack years: 30.00    Types: Cigarettes    Quit date: 2018    Years since quitting: 4.8   Smokeless tobacco: Never   Tobacco comments:    Former smoker 10/28/2020  Vaping Use   Vaping Use: Never used  Substance and Sexual Activity   Alcohol use: Not Currently   Drug use: Never   Sexual activity: Not on file  Other Topics Concern   Not on file  Social History Narrative   ** Merged History Encounter **       Social Determinants of Health   Financial Resource Strain: Not on file  Food Insecurity: Not on file  Transportation Needs: Not on file  Physical Activity: Not on file  Stress: Not on file  Social Connections: Not on file  Intimate Partner Violence: Not on file  :  Review of Systems  Constitutional:  Positive for malaise/fatigue.  HENT: Negative.    Eyes: Negative.  Negative for discharge and redness.  Respiratory:  Positive for shortness of breath and wheezing.   Cardiovascular: Negative.   Gastrointestinal:  Positive for diarrhea.  Genitourinary:  Negative.   Musculoskeletal: Negative.   Skin: Negative.   Neurological: Negative.   Endo/Heme/Allergies: Negative.   Psychiatric/Behavioral: Negative.      Exam: Patient Vitals for the past 24 hrs:  BP Temp Temp src Pulse Resp SpO2 Height Weight  12/23/20 0500 (!) 71/23 -- -- (!) 58 18 96 % -- --  12/23/20 0400 (!) 94/57 -- -- (!) 234 (!) 21 (!) 85 % -- 200 lb 13.4 oz (91.1 kg)  12/23/20 0300 (!) 68/31 -- -- 67 17 90 % -- --  12/23/20 0211 (!) 91/57 -- -- 64 16 93 % -- --  12/23/20 0200 (!) 77/36 -- -- (!) 52 20 94 % -- --  12/23/20 0116 -- -- -- -- -- 95 % -- --  12/23/20 0100 (!) 99/52 -- -- 69 17 98 % -- --  12/23/20 0000 (!) 71/40 -- -- 65 18 93 % -- --  12/22/20 2300 (!) 92/58 -- -- 70 17 94 % -- --  12/22/20 2200 (!) 96/55 -- -- 70 19 96 % -- --  12/22/20 2100 (!) 89/42 -- -- 79 (!) 27 91 % -- --  12/22/20 2031 (!) 151/133 -- -- -- -- 92 % -- --  12/22/20 2000 (!) 151/133 (!) 96 F (35.6 C) Axillary 85 (!) 29 97 % -- --  12/22/20 1933 -- -- -- -- -- 96 % -- --  12/22/20 1900 (!) 91/49 -- -- 91 (!) 36 95 % -- --  12/22/20 1845 -- (!) 97 F (36.1 C) Oral -- -- -- -- --  12/22/20 1513 -- -- -- -- -- 97 % -- --  12/22/20 1430 109/71 -- -- 98 16 93 % -- --  12/22/20 1200 98/80 -- -- (!) 102 (!) 33 99 % -- --  12/22/20 1130 109/61 -- -- (!) 104 (!) 34 93 % -- --  12/22/20 1115 (!) 106/58 -- -- (!) 107 (!) 28 91 % -- --  12/22/20 1050 -- -- -- (!) 103 (!) 33 96 % -- --  12/22/20 1045 -- -- -- (!) 102 (!) 37 100 % -- --  12/22/20 1032 -- -- -- (!) 103 (!) 30 97 % -- --  12/22/20 1031 -- -- -- (!) 106 (!) 27 97 % -- --  12/22/20 1030 112/67 -- -- (!) 103 (!) 31 95 % -- --  12/22/20 1015 -- -- -- (!) 117 (!) 32 90 % -- --  12/22/20 1010 122/78 -- -- (!) 115 (!) 33 91 % -- --  12/22/20 1000 -- -- -- (!) 119 (!) 27 (!) 87 % -- --  12/22/20 0932 103/83 98.7 F (37.1 C) Oral -- -- -- -- --  12/22/20 0930 -- -- -- -- -- -- 5\' 11"  (1.803 m) 205 lb (93 kg)  12/22/20 0929 -- -- --  (!) 119 -- 92 % -- --  12/22/20 0925 -- -- -- (!) 128 20 (!) 81 % -- --   This is a fairly well-developed well-nourished white male in no obvious distress.  His vital signs are temperature of 99.  Pulse is 78.  Blood pressure 90/54.  His head exam shows no scleral icterus.  He has no ocular or oral lesions.  He has no palpable cervical or supraclavicular lymph nodes.  His lungs are with wheezing bilaterally.  He has decent air movement bilaterally.  Cardiac exam regular rate and rhythm.  He has no murmurs.  Abdomen soft.  He has decent bowel sounds.  Spleen tip might be palpable at the left costal margin.  There is no hepatomegaly.  Extremities shows no clubbing, cyanosis or edema.  Neurological exam shows no focal neurological deficits.   Recent Labs    12/22/20 1000 12/22/20 1035 12/23/20 0239  WBC 1.9*  --  1.5*  HGB 12.7* 12.6* 10.3*  HCT 37.2* 37.0* 30.6*  PLT 294  --  260    Recent Labs    12/22/20 1003 12/22/20 1035 12/23/20 0239  NA 129* 128* 131*  K 4.1 4.7 4.3  CL 91* 92* 98  CO2 27  --  25  GLUCOSE 112* 106* 168*  BUN 16 19 20   CREATININE 1.07 1.20 0.97  CALCIUM 8.7*  --  8.3*    Blood smear review: None  Pathology: None    Assessment and Plan: Mr. Kuhlman is a 60 year old white male.  He has metastatic squamous cell carcinoma of the lung.  He has been on immunotherapy.  Hopefully all this is from immunotherapy.  Again, the fact that has had this profound leukopenia for quite a while and certainly put him at high risk for opportunistic infections.  The CT scan site does not look like any obvious bacterial or fungal infection.  I probably would increase the steroids a little bit.  I may think about get him on some kind of prophylactic antiviral and antifungal agent.  Regardless, he is not can have any more immunotherapy.  By the CT scan, it looks like his cancer is progressing.  I know that he apparently had a hard time with chemotherapy in the past.  I do not  think he really had all that much chemotherapy.  He does look better than I would have thought.  I am happy about this for him.  I suspect he will be in the hospital for several days.  We really need to see about getting his lungs better.  1 possibility that may need to be thought about is a bronchoscopy and getting may be transbronchial biopsies.  Getting cultures certainly  could be a possibility with a bronchoscopy.  I would think that since he is doing fairly well right now, anything invasive can be held off on.  I know he will get incredible care from all the staff down the ICU.  I know they will work very hard with Mr. Corniel.  He has a strong faith.  We had a good prayer this morning.  [name], mD  Isaiah 26:3

## 2020-12-23 NOTE — Assessment & Plan Note (Addendum)
Met SIRS criteria on admission with leukopenia, tachypnea, tachycardia and hypotension. Responded to IV fluids. Close monitoring of the stepdown unit.  Continue IV antibiotics.  Follow-up on the cultures.

## 2020-12-23 NOTE — Assessment & Plan Note (Signed)
Secondary to chemotherapy.  Currently receiving prophylactic medication for infection.  Monitor.  Management per oncology.

## 2020-12-23 NOTE — Assessment & Plan Note (Addendum)
After receiving nebulizer therapy had RVR. Currently on Xopenex. On amiodarone and Toprol-XL.  Toprol currently on hold due to hypotension. Amiodarone can be also be the cause of his lung toxicity although not a good candidate to stop this medication right now.

## 2020-12-23 NOTE — Progress Notes (Signed)
[facility] Progress Note Patient Name: John [name] DOB: 1960-09-20 MRN: [id number]   Date of Service  12/23/2020  HPI/Events of Note  Patient became hypotensive after getting Metoprolol XL 25 mg, and Amiodarone earlier tonight. BP was as low as 68/31 but hs now bounced back up to 90/45. Most recent EF via Echo was 60-65 % 6 months ago.  eICU Interventions  Will give a 500 ml  LR bolus, Metoprolol dosing will need to be re-evaluated by AM attending physician.        [name] 12/23/2020, 3:40 AM

## 2020-12-23 NOTE — Assessment & Plan Note (Signed)
Stage IV non-small cell lung cancer.  Treated with Keytruda.  Presents with shortness of breath.  X-ray shows evidence of pneumonitis most likely from [place].  Less likely amiodarone induced lung toxicity. Currently receiving IV antibiotics as well as cannot rule out community-acquired pneumonia and procalcitonin is elevated. Pulmonary consulted.  Oncology following. Receiving IV steroids.  125 mg Most likely per oncology will not be coordinated for immunotherapy in future.

## 2020-12-23 NOTE — Progress Notes (Signed)
NAME:  John [name], MRN:  [id number], DOB:  1960/04/04, LOS: 0 ADMISSION DATE:  12/22/2020, CONSULTATION DATE:  12/23/20 REFERRING MD:  John [name], CHIEF COMPLAINT:  Dyspnea   History of Present Illness:  John [name] is a 60 y.o. 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  He reports increased productive cough with white sputum along with sweats and malaise/fatigue.   No known ill contacts.  He was seen by cardiology who did not feel this was a cardiac issue, so was seen at the pulmonary office several days ago and prescribed Augmentin and Prednisone along with nasal cannula O2.   He was using 6L over the last several days, but became so acutely short of breath that he could hardly walk and home O2 sats read 79%, so presented to the ED.   In the ED required 15L and non-rebreather.  CXR with diffuse bilateral heterogenous opacities unchanged from prior. CTA chest without PE, findings consistent with progressive pnemonitis.   Lactic acid 2.0,  WBC 1.9, ABG 7.39, pCO2 46, pO2 26, Na 129.  He was given Solumedrol, Vancomycin, Cefepime, Azithromycin, Xopanex and admitted to the hospital medicine service.  PCCM consulted.   Pertinent  Medical History   has a past medical history of Dyspnea, Family history of adverse reaction to anesthesia, GERD (gastroesophageal reflux disease), History of radiation therapy (04/24/2020-05/16/2020), PAF (paroxysmal atrial fibrillation) ([place]), Rheumatoid aortitis, and Squamous cell lung cancer ([place]).   Significant Hospital Events: Including procedures, antibiotic start and stop dates in addition to other pertinent events   11/7 presented to the ED with dyspnea and worsening O2 requirement, CTA chest consistent with worsening pneumonitis 11/8 improved respiratory status, off NRBM  Interim History / Subjective:   Pt reports  that he slept fairly well last night, breathing feels less labored this morning   Objective   Blood pressure (!) 91/56, pulse 71, temperature 97.6 F (36.4 C), temperature source Oral, resp. rate (!) 25, height 5\' 11"  (1.803 m), weight 91.1 kg, SpO2 99 %.    FiO2 (%):  [100 %] 100 %   Intake/Output Summary (Last 24 hours) at 12/23/2020 0913 Last data filed at 12/23/2020 3086 Gross per 24 hour  Intake 4917.87 ml  Output 1150 ml  Net 3767.87 ml    Filed Weights   12/22/20 0930 12/23/20 0400  Weight: 93 kg 91.1 kg     General:  well-nourished M, awake and sitting up in bed in no acute distress HEENT: MM pink/moist, PERRLA Neuro: awake, alert, oriented x3 without focal deficits CV: s1s2 rrr, no m/r/g PULM:  mild expiratory wheezing bilaterally R>L GI: soft, bsx4 active  Extremities: warm/dry, no edema  Skin: no rashes or lesions    Resolved Hospital Problem list     Assessment & Plan:   Acute Hypoxic Respiratory Failure  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, also on Amiodarone  No PE, worsening aeration consistent with pneumonitis, most likely secondary to to [facility], though symptoms onset does sound somewhat acute with dyspnea and productive cough beginning one week ago -Covid-19 and flu negative, RVP also negative -continue steroids with Solumedrol 125mg  qd -continue supplemental O2 to maintain O2 sats >92%, weaning HFNC -Continue Cefepime and Azithromycin -Oncology recommended prophylactic antiviral started on Acyclovir and Itraconazole -continue Amiodarone for now, would like to avoid RVR complicating the  clinical picture -continue xopanex nebs  -Bipap can be used if worsening on high flow, but would likely need intubated if progressing to that point  -oncology following, concern is that he could not tolerate chemo and now no immunotherapy with enlarging adrenal mass, consider palliative consult  Thank you for this consult,  PCCM will continue to follow with you   Best Practice (right click and "Reselect all SmartList Selections" daily)   Diet/type: Regular consistency (see orders) DVT prophylaxis: DOAC GI prophylaxis: N/A Lines: N/A Foley:  N/A Code Status:  full code Last date of multidisciplinary goals of care discussion [pending, wife at the bedside with patient and updated on plan of care]  Labs   CBC: Recent Labs  Lab 12/22/20 1000 12/22/20 1035 12/23/20 0239  WBC 1.9*  --  1.5*  NEUTROABS 0.9*  --   --   HGB 12.7* 12.6* 10.3*  HCT 37.2* 37.0* 30.6*  MCV 84.5  --  85.2  PLT 294  --  260     Basic Metabolic Panel: Recent Labs  Lab 12/22/20 1003 12/22/20 1035 12/23/20 0239  NA 129* 128* 131*  K 4.1 4.7 4.3  CL 91* 92* 98  CO2 27  --  25  GLUCOSE 112* 106* 168*  BUN 16 19 20   CREATININE 1.07 1.20 0.97  CALCIUM 8.7*  --  8.3*    GFR: Estimated Creatinine Clearance: 94.6 mL/min (by C-G formula based on SCr of 0.97 mg/dL). Recent Labs  Lab 12/22/20 1000 12/22/20 1003 12/22/20 1234 12/23/20 0239  PROCALCITON  --   --  0.22 0.39  WBC 1.9*  --   --  1.5*  LATICACIDVEN  --  2.0* 1.3  --      Liver Function Tests: Recent Labs  Lab 12/22/20 1003 12/23/20 0239  AST 33 27  ALT 18 15  ALKPHOS 68 58  BILITOT 1.0 0.7  PROT 7.0 6.1*  ALBUMIN 2.5* 2.3*    No results for input(s): LIPASE, AMYLASE in the last 168 hours. No results for input(s): AMMONIA in the last 168 hours.  ABG    Component Value Date/Time   HCO3 27.5 12/22/2020 1002   TCO2 27 12/22/2020 1035   O2SAT 37.2 12/22/2020 1002      Coagulation Profile: Recent Labs  Lab 12/22/20 1003  INR 2.2*     Cardiac Enzymes: No results for input(s): CKTOTAL, CKMB, CKMBINDEX, TROPONINI in the last 168 hours.  HbA1C: No results found for: HGBA1C  CBG: No results for input(s): GLUCAP in the last 168 hours.  Review of Systems:   Review of Systems  Constitutional:  Positive for chills and  malaise/fatigue. Negative for fever and weight loss.  Respiratory:  Positive for cough, sputum production and shortness of breath. Negative for hemoptysis and wheezing.   Cardiovascular: Negative.   Gastrointestinal:  Positive for diarrhea. Negative for nausea and vomiting.  Genitourinary: Negative.   Musculoskeletal:  Negative for myalgias.    Past Medical History:  He,  has a past medical history of Dyspnea, Family history of adverse reaction to anesthesia, GERD (gastroesophageal reflux disease), History of radiation therapy (04/24/2020-05/16/2020), PAF (paroxysmal atrial fibrillation) ([place]), Rheumatoid aortitis, and Squamous cell lung cancer ([place]).   Surgical History:   Past Surgical History:  Procedure Laterality Date   BRONCHIAL BRUSHINGS  03/27/2020   Procedure: BRONCHIAL BRUSHINGS;  Surgeon: [name], MD;  Location: [facility] ENDOSCOPY;  Service: Cardiopulmonary;;   BUBBLE STUDY  10/13/2020   Procedure: BUBBLE STUDY;  Surgeon: Lyman Bishop  C, MD;  Location: [place];  Service: Cardiovascular;;   CARDIOVERSION N/A 10/13/2020   Procedure: CARDIOVERSION;  Surgeon: Pixie Casino, MD;  Location: [facility] ENDOSCOPY;  Service: Cardiovascular;  Laterality: N/A;   CATARACT EXTRACTION  2016   at [place]  03/27/2020   Procedure: FINE NEEDLE ASPIRATION;  Surgeon: [name], MD;  Location: [place];  Service: Cardiopulmonary;;   HIP SURGERY Left    TEE WITHOUT CARDIOVERSION N/A 10/13/2020   Procedure: TRANSESOPHAGEAL ECHOCARDIOGRAM (TEE);  Surgeon: Pixie Casino, MD;  Location: [place];  Service: Cardiovascular;  Laterality: N/A;   VIDEO BRONCHOSCOPY WITH ENDOBRONCHIAL ULTRASOUND N/A 03/27/2020   Procedure: VIDEO BRONCHOSCOPY WITH ENDOBRONCHIAL ULTRASOUND;  Surgeon: [name], MD;  Location: [place];  Service: Cardiopulmonary;  Laterality: N/A;     Social History:   reports that he quit smoking about 4 years ago. His smoking use included  cigarettes. He has a 30.00 pack-year smoking history. He has never used smokeless tobacco. He reports that he does not currently use alcohol. He reports that he does not use drugs.   Family History:  His family history includes Arrhythmia in his mother; Heart disease in his father.   Allergies Allergies  Allergen Reactions   Albuterol Other (See Comments)    Patient has had episode of atrial fib following administration of albuterol (tolerates Xopenex well)     Home Medications  Prior to Admission medications   Medication Sig Start Date End Date Taking? Authorizing Provider  amiodarone (PACERONE) 200 MG tablet Take 1 tablet (200 mg total) by mouth daily. Patient taking differently: Take 200 mg by mouth every evening. 12/16/20 01/15/21 Yes Fenton, Clint R, PA  amoxicillin-clavulanate (AUGMENTIN) 875-125 MG tablet Take 1 tablet by mouth 2 (two) times daily. 12/18/20  Yes Lauraine Rinne, NP  levalbuterol [facility] HFA) 45 MCG/ACT inhaler Inhale 2 puffs into the lungs every 4 (four) hours as needed for wheezing. 06/11/20  Yes [name], MD  metoprolol succinate (TOPROL XL) 25 MG 24 hr tablet Take 1 tablet (25 mg total) by mouth daily. Patient taking differently: Take 25 mg by mouth at bedtime. 11/19/20  Yes Imogene Burn, PA-C  PARoxetine (PAXIL) 10 MG tablet Take 10 mg by mouth daily. 01/25/20  Yes [provider]  pembrolizumab (KEYTRUDA) 100 MG/4ML SOLN Inject 2 mg/kg into the vein every 21 ( twenty-one) days.   Yes [provider]  predniSONE (DELTASONE) 5 MG tablet Take 5 mg by mouth daily. 09/01/20  Yes [provider]  PRILOSEC OTC 20 MG tablet Take 20 mg by mouth at bedtime. 11/13/19  Yes [provider]  rivaroxaban (XARELTO) 20 MG TABS tablet Take 1 tablet (20 mg total) by mouth daily with supper. 11/13/20 02/11/21 Yes Turner, Eber Hong, MD  atorvastatin (LIPITOR) 10 MG tablet Take 1 tablet (10 mg total) by mouth daily. 11/19/20 02/17/21  Imogene Burn, PA-C  predniSONE (DELTASONE) 10 MG tablet Take 4 tablets (40 mg total) by mouth daily with breakfast for 3 days, THEN 3 tablets (30 mg total) daily with breakfast for 3 days, THEN 2 tablets (20 mg total) daily with breakfast for 3 days, THEN 1 tablet (10 mg total) daily with breakfast for 3 days. Patient not taking: Reported on 12/22/2020 12/18/20 12/30/20  Lauraine Rinne, NP  Tiotropium Bromide-Olodaterol (STIOLTO RESPIMAT) 2.5-2.5 MCG/ACT AERS Inhale 2 puffs into the lungs daily. 12/18/20   Lauraine Rinne, NP  Critical care time: n/a      Otilio Carpen Athira Janowicz, PA-C [facility] See Amion for pager If no response to pager , please call [phone] until 7pm After 7:00 pm call Elink  [phone]

## 2020-12-23 NOTE — Assessment & Plan Note (Signed)
Likely cause of patient's shortness of breath prior to admission. Appreciate pulmonary care.  Started on Anoro.  Will need this on discharge.

## 2020-12-23 NOTE — Assessment & Plan Note (Signed)
Oncology following.  Has leukopenia and therefore recommending to add antiviral and antifungal prophylaxis. Monitor.

## 2020-12-23 NOTE — Progress Notes (Signed)
  Progress Note    John [name]   LKT:[id number]  DOB: Mar 09, 1960  DOA: 12/22/2020     0 Date of Service: 12/23/2020   Clinical Course 11/7, presented to the hospital with hypoxia. 11/8 hypoxia worsening.  Assessment and Plan * Drug-induced pneumonitis Stage IV non-small cell lung cancer.  Treated with Keytruda.  Presents with shortness of breath.  X-ray shows evidence of pneumonitis most likely from [place].  Less likely amiodarone induced lung toxicity. Currently receiving IV antibiotics as well as cannot rule out community-acquired pneumonia and procalcitonin is elevated. Pulmonary consulted.  Oncology following. Receiving IV steroids.  125 mg Most likely per oncology will not be coordinated for immunotherapy in future.  Acute on chronic respiratory failure with hypoxia (HCC) Drops to 79% on 5 LPM.  Currently on 8 LPM.  Overnight required NRB. Most likely combination of pneumonitis as well as potential pneumonia.  Continue to monitor in stepdown unit due to severity of hypoxia.  PAF (paroxysmal atrial fibrillation) (HCC) After receiving nebulizer therapy had RVR. Currently on Xopenex. On amiodarone and Toprol-XL.  Toprol currently on hold due to hypotension. Amiodarone can be also be the cause of his lung toxicity although not a good candidate to stop this medication right now.  Stage 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 [facility]) Oncology following.  Has leukopenia and therefore recommending to add antiviral and antifungal prophylaxis. Monitor.  Leukocytopenia Secondary to chemotherapy.  Currently receiving prophylactic medication for infection.  Monitor.  Management per oncology.  Severe sepsis ([place]) Met SIRS criteria on admission with leukopenia, tachypnea, tachycardia and hypotension. Responded to IV fluids. Close monitoring of the stepdown unit.  Continue IV antibiotics.  Follow-up on the cultures.   COPD with asthma ([place]) Likely cause of patient's shortness of  breath prior to admission. Appreciate pulmonary care.  Started on Anoro.  Will need this on discharge.  Hyponatremia Sodium 131.  Likely from poor p.o. intake.  Monitor.  Improving from 129.     Subjective:  Feeling better.  No nausea no vomiting.  Pulmonary oxygen requirement has worsened.  No chest pain.  Abdominal pain.  Objective Vitals:   12/23/20 1200 12/23/20 1300 12/23/20 1400 12/23/20 1600  BP: (!) 89/66 100/69 (!) 93/58   Pulse: 75 70 78   Resp: (!) 21 (!) 30 (!) 26   Temp: 98 F (36.7 C)   98.2 F (36.8 C)  TempSrc: Oral   Oral  SpO2: 98% 100% 98%   Weight:      Height:       91.1 kg  Exam General: Appear in mild distress, no Rash; Oral Mucosa Clear, moist. no Abnormal Neck Mass Or lumps, Conjunctiva normal  Cardiovascular: S1 and S2 Present, no Murmur, Respiratory: increased respiratory effort, Bilateral Air entry present and bilateral  Crackles, bilateral expiratory wheezes Abdomen: Bowel Sound present, Soft and no tenderness Extremities: trace Pedal edema Neurology: alert and oriented to time, place, and person affect appropriate. no new focal deficit Gait not checked due to patient safety concerns     Labs / Other Information Sodium improving to 131.  Leukopenia persist.   Disposition Plan: Status is: Inpatient  Remains inpatient appropriate because: Ongoing severe hypoxia requiring treatment adjustment and IV steroids.  Time spent: 35 minutes [facility] 12/23/2020, 5:01 PM

## 2020-12-23 NOTE — Assessment & Plan Note (Signed)
Drops to 79% on 5 LPM.  Currently on 8 LPM.  Overnight required NRB. Most likely combination of pneumonitis as well as potential pneumonia.  Continue to monitor in stepdown unit due to severity of hypoxia.

## 2020-12-24 ENCOUNTER — Inpatient Hospital Stay (HOSPITAL_COMMUNITY): Payer: 59

## 2020-12-24 DIAGNOSIS — T50905A Adverse effect of unspecified drugs, medicaments and biological substances, initial encounter: Secondary | ICD-10-CM | POA: Diagnosis not present

## 2020-12-24 DIAGNOSIS — R062 Wheezing: Secondary | ICD-10-CM | POA: Diagnosis not present

## 2020-12-24 DIAGNOSIS — D702 Other drug-induced agranulocytosis: Secondary | ICD-10-CM | POA: Diagnosis not present

## 2020-12-24 DIAGNOSIS — J9601 Acute respiratory failure with hypoxia: Secondary | ICD-10-CM | POA: Diagnosis not present

## 2020-12-24 DIAGNOSIS — J449 Chronic obstructive pulmonary disease, unspecified: Secondary | ICD-10-CM

## 2020-12-24 DIAGNOSIS — J189 Pneumonia, unspecified organism: Secondary | ICD-10-CM | POA: Diagnosis not present

## 2020-12-24 LAB — CBC WITH DIFFERENTIAL/PLATELET
Abs Immature Granulocytes: 0.01 10*3/uL (ref 0.00–0.07)
Basophils Absolute: 0 10*3/uL (ref 0.0–0.1)
Basophils Relative: 0 %
Eosinophils Absolute: 0 10*3/uL (ref 0.0–0.5)
Eosinophils Relative: 0 %
HCT: 29.1 % — ABNORMAL LOW (ref 39.0–52.0)
Hemoglobin: 9.7 g/dL — ABNORMAL LOW (ref 13.0–17.0)
Immature Granulocytes: 1 %
Lymphocytes Relative: 11 %
Lymphs Abs: 0.2 10*3/uL — ABNORMAL LOW (ref 0.7–4.0)
MCH: 28.5 pg (ref 26.0–34.0)
MCHC: 33.3 g/dL (ref 30.0–36.0)
MCV: 85.6 fL (ref 80.0–100.0)
Monocytes Absolute: 0.2 10*3/uL (ref 0.1–1.0)
Monocytes Relative: 13 %
Neutro Abs: 1.3 10*3/uL — ABNORMAL LOW (ref 1.7–7.7)
Neutrophils Relative %: 75 %
Platelets: 307 10*3/uL (ref 150–400)
RBC: 3.4 MIL/uL — ABNORMAL LOW (ref 4.22–5.81)
RDW: 14.5 % (ref 11.5–15.5)
WBC: 1.8 10*3/uL — ABNORMAL LOW (ref 4.0–10.5)
nRBC: 0 % (ref 0.0–0.2)

## 2020-12-24 LAB — COMPREHENSIVE METABOLIC PANEL
ALT: 18 U/L (ref 0–44)
AST: 28 U/L (ref 15–41)
Albumin: 2.3 g/dL — ABNORMAL LOW (ref 3.5–5.0)
Alkaline Phosphatase: 49 U/L (ref 38–126)
Anion gap: 8 (ref 5–15)
BUN: 19 mg/dL (ref 6–20)
CO2: 22 mmol/L (ref 22–32)
Calcium: 8 mg/dL — ABNORMAL LOW (ref 8.9–10.3)
Chloride: 102 mmol/L (ref 98–111)
Creatinine, Ser: 0.77 mg/dL (ref 0.61–1.24)
GFR, Estimated: >60 mL/min (ref >60)
Glucose, Bld: 199 mg/dL — ABNORMAL HIGH (ref 70–99)
Potassium: 3.7 mmol/L (ref 3.5–5.1)
Sodium: 132 mmol/L — ABNORMAL LOW (ref 135–145)
Total Bilirubin: 0.5 mg/dL (ref 0.3–1.2)
Total Protein: 5.7 g/dL — ABNORMAL LOW (ref 6.5–8.1)

## 2020-12-24 LAB — PROCALCITONIN: Procalcitonin: 0.2 ng/mL

## 2020-12-24 LAB — C-REACTIVE PROTEIN: CRP: 6.5 mg/dL — ABNORMAL HIGH (ref <1.0)

## 2020-12-24 NOTE — Progress Notes (Addendum)
NAME:  John [name], MRN:  [id number], DOB:  07-19-1960, LOS: 1 ADMISSION DATE:  12/22/2020, CONSULTATION DATE:  12/23/20 REFERRING MD:  Olevia Bowens, CHIEF COMPLAINT:  Dyspnea   History of Present Illness:  John [name] is a 60 y.o. 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  He reports increased productive cough with white sputum along with sweats and malaise/fatigue.   No known ill contacts.  He was seen by cardiology who did not feel this was a cardiac issue, so was seen at the pulmonary office several days ago and prescribed Augmentin and Prednisone along with nasal cannula O2.   He was using 6L over the last several days, but became so acutely short of breath that he could hardly walk and home O2 sats read 79%, so presented to the ED.   In the ED required 15L and non-rebreather.  CXR with diffuse bilateral heterogenous opacities unchanged from prior. CTA chest without PE, findings consistent with progressive pnemonitis.   Lactic acid 2.0,  WBC 1.9, ABG 7.39, pCO2 46, pO2 26, Na 129.  He was given Solumedrol, Vancomycin, Cefepime, Azithromycin, Xopanex and admitted to the hospital medicine service.  PCCM consulted.   Pertinent  Medical History   has a past medical history of Dyspnea, Family history of adverse reaction to anesthesia, GERD (gastroesophageal reflux disease), History of radiation therapy (04/24/2020-05/16/2020), PAF (paroxysmal atrial fibrillation) ([place]), Rheumatoid aortitis, and Squamous cell lung cancer ([place]).   Significant Hospital Events: Including procedures, antibiotic start and stop dates in addition to other pertinent events   11/7 presented to the ED with dyspnea and worsening O2 requirement, CTA chest consistent with worsening pneumonitis 11/8 improved respiratory status, off NRBM  Interim History / Subjective:  Continuing to  feel better. Desaturates when having a bowel movement due to bearing down and holding his breath.  Objective   BP 95/60   Pulse 60   Temp (!) 97.5 F (36.4 C) (Oral)   Resp 16   Ht _0  (1.803 m)   Wt 91.1 kg   SpO2 97%   BMI 28.01 kg/m     Intake/Output Summary (Last 24 hours) at 12/24/2020 0706 Last data filed at 12/24/2020 1740 Gross per 24 hour  Intake 919.99 ml  Output 1150 ml  Net -230.01 ml      Last Weight  Most recent update: 12/23/2020  5:19 AM    Weight  91.1 kg (200 lb 13.4 oz)             General: middle aged man lying in bed in NAD HEENT: [place]/AT, eyes anicteric Neuro: awake, moving all extremities, answering questions appropriately CV: S1S2, RRR PULM:  basilar rhales improving, no accessory muscle use GI: soft, NT Extremities: no cyanosis, no peripheral edema Skin: warm, dry, no rashes.   WBC 1.8 H/H 9.7/29.1 Na+  132  Resolved Hospital Problem list     Assessment & Plan:   Acute hypoxic respiratory failure due to acute pneumonitis from [facility], less likely amiodarone lung toxicity or bacterial pneumonia. RVP negative. Oxygen requirements slowly improving. Stage IV NSCLC -con't steroids- solumedrol 124m daily -con't supplemental oxygen to maintain SpO2 >90% -Continue Cefepime and Azithromycin to complete course for CAP -Oncology recommended prophylactic antiviral started on Acyclovir and Itraconazole -Ok to continue amiodarone for now. Worry about him developing recurrent Afib with RVR with hypoxia, which  could cloud clinical picture. -con't atrovent and xopenex nebs PRN -Anoro daily. Recommend discharge home on LAMA/LABA (Stiolto, Anoro, [place]- whichever is covered by his insurance). -Appreciate Oncology's management. This admission is a good opportunity to address goals of care with limited treatment options without the ability to use PDL1 immunotherapy and previous chemo intolerance.  Leukopenia, lymphopenia and neutropenia> felt to be  related to RA -antiviral and antifungal prophylaxis -defer additional workup or management to Hematology  Anemia -transfuse for Hb<7 or hemodynamically significant bleeding  At risk for steroid myopathy and deconditioning -OOB mobility as able   Best Practice (right click and "Reselect all SmartList Selections" daily)   Diet/type: Regular consistency (see orders) DVT prophylaxis: DOAC GI prophylaxis: N/A Lines: N/A Foley:  N/A Code Status:  full code Last date of multidisciplinary goals of care discussion [pending, wife at the bedside with patient and updated on plan of care]  Labs   CBC: Recent Labs  Lab 12/22/20 1000 12/22/20 1035 12/23/20 0239  WBC 1.9*  --  1.5*  NEUTROABS 0.9*  --   --   HGB 12.7* 12.6* 10.3*  HCT 37.2* 37.0* 30.6*  MCV 84.5  --  85.2  PLT 294  --  260     Basic Metabolic Panel: Recent Labs  Lab 12/22/20 1003 12/22/20 1035 12/23/20 0239  NA 129* 128* 131*  K 4.1 4.7 4.3  CL 91* 92* 98  CO2 27  --  25  GLUCOSE 112* 106* 168*  BUN _0 CREATININE 1.07 1.20 0.97  CALCIUM 8.7*  --  8.3*    GFR: Estimated Creatinine Clearance: 94.6 mL/min (by C-G formula based on SCr of 0.97 mg/dL). Recent Labs  Lab 12/22/20 1000 12/22/20 1003 12/22/20 1234 12/23/20 0239  PROCALCITON  --   --  0.22 0.39  WBC 1.9*  --   --  1.5*  LATICACIDVEN  --  2.0* 1.3  --      Julian Hy, DO 12/24/20 9:34 AM [facility]

## 2020-12-24 NOTE — Progress Notes (Signed)
Overall, I think that John [name] might be a little bit better.  Apparently he did get out of bed to go to the bathroom and desaturated.  He is on broad-spectrum antibiotic, antifungal and antiviral coverage.  He is on high-dose steroids thinking that this might be pneumonitis from his immunotherapy.  We will check another chest x-ray on him today.  When I listen to him, it does not sound as much wheezing.  His labs today show white count 1.8.  Hemoglobin 9.7.  Platelet count 307,000.  His BUN is 19 creatinine 0.77.  Calcium is 8 with an albumin of 2.3.  So far, cultures are all negative.  I know we have tried Neupogen on him in the past.  He apparently does not do well with Neupogen.  He says he has had reactions to this.  I am not sure how much he is really eating.  I do not think there is any nausea or vomiting.  He has had no diarrhea.  There has been no bleeding.  He has had no fever.  His vital signs are temperature 97.5.  Pulse 60.  Blood pressure 95/60.  Head neck exam shows no ocular or oral lesions.  His lungs do show some wheezing but less than yesterday.  He sounds like there is good air movement bilaterally.  Cardiac exam regular rate and rhythm.  Abdomen is soft.  We will see what the chest x-ray shows.  There is still might be the need for a bronchoscopy and biopsies/washings to see what might be going on if things do not improve.  I appreciate the staff on the ICU.  You guys are doing a tremendous job with him.!!!!  [name], MD  [name] 1:37

## 2020-12-24 NOTE — Progress Notes (Signed)
PROGRESS NOTE    John [name]  FGH:[id number] DOB: September 01, 1960 DOA: 12/22/2020 PCP: [name], MD    Brief Narrative:  John [name] is a 60 year old male with past medical history significant for stage 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 who just completed 11th cycle of Keytruda, paroxysmal atrial fibrillation who presented to [facility] on 11/7 with progressive shortness of breath x1 week.  Associated with increased productive cough with white sputum and sweats/malaise/fatigue.  Recently seen in outpatient pulmonology office and prescribed Augmentin and prednisone with supple oxygen.  Patient's shortness of breath progressed on 6 L nasal cannula which prompted evaluation in the ED.  In the ED, patient requiring 15 L NRB, chest x-ray with diffuse bilateral heterogeneous opacities.  CTA chest without PE but findings consistent with progressive pneumonitis.  Lactic acid 2.0.  WBC 1.9.  ABG with pH 7.39, PCO2 46, PO2 26.  Patient was given IV Solu-Medrol, vancomycin, cefepime, azithromycin, Xopenex nebs.  Hospitalist service consulted for further evaluation and management of acute hypoxic respiratory failure secondary to commune acquired pneumonia versus drug-induced pneumonitis.   Assessment & Plan:   Principal Problem:   Drug-induced pneumonitis Active Problems:   COPD with asthma ([place])   Stage 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 (HCC)   Hyponatremia   PAF (paroxysmal atrial fibrillation) (HCC)   Severe sepsis (HCC)   Acute on chronic respiratory failure with hypoxia (HCC)   Leukocytopenia   Acute hypoxic respite failure, POA Severe sepsis, POA Drug-induced pneumonitis vs community-acquired pneumonia Patient presenting to the ED with progressive shortness of breath despite outpatient treatment by pulmonology with Augmentin, prednisone and started on supplemental oxygen; which he is not on oxygen at baseline.  Chest x-ray with diffuse bilateral heterogeneous airspace  opacities and CT angiogram chest negative for PE with worsening aeration of lungs bilaterally concerning for progressively worsening pneumonitis likely secondary to immunotherapy treatment with Keytruda.  Required 15 L NRB on ED presentation.  Met criteria on admission with leukopenia, tachycardia, tachycardia and hypotension that was responsive to IV fluid hydration. --PCCM/oncology following, appreciate assistance --Solu-Medrol 125 mg IV q24h --Azithromycin 500 mg PO daily --Cefepime 2 g IV q8h --Anoro Ellipta 1 puff daily --Xopenex nebs q6h while awake and PRN --Continue supplemental oxygen, maintain SPO2 greater than 88%, on 12 L HFNC   Leukopenia Etiology likely secondary to chemotherapy. --Continue prophylactic antiviral/antifungal with itraconazole and acyclovir --CBC daily  Hyponatremia Sodium 129 on admission, likely secondary to poor oral intake.  Improved. --Na 129>>132 --BMP daily  Paroxysmal atrial fibrillation Holding home metoprolol succinate 70m qHS due to borderline hypotension. --Amiodarone 2050mPO daily --Eliquis 2.5 mg PO BID (on xarelto at home but changed to Eliquis due to compatibility with itraconazole)  Stage IV squamous cell carcinoma right lung --Further per oncology.  COPD --Started on Anoro during hospitalization.  Pulmonology recommends LAMA/LABA on discharge (Stioloto vs Anoro vs Bevespri) --continue oxygen as above  HLD: Atorvastatin 10 mg p.o. daily  Depression/anxiety: Paxil 10 mg p.o. daily  GERD: Continue PPI   DVT prophylaxis: apixaban (ELIQUIS) tablet 2.5 mg Start: 12/23/20 2200 apixaban (ELIQUIS) tablet 2.5 mg    Code Status: Full Code Family Communication: Updated patient spouse present at bedside this morning  Disposition Plan:  Level of care: Stepdown Status is: Inpatient  Remains inpatient appropriate because: Continues on IV steroids, IV antibiotics, high O2 demand    Consultants:  PCShinglehousencology, Dr.  EnMarin OlpProcedures:  None  Antimicrobials:  Azithromycin 11/7>> Cefepime 11/7>>  Itraconazole 11/8>> Acyclovir 11/8>> Vancomycin 11/7 -11/7    Subjective: Patient seen examined at bedside, resting comfortably.  Sitting in bedside chair eating breakfast.  Spouse present.  States he feels much better in terms of his respiratory status today.  Continues on 12 L high flow nasal cannula.  Seen by oncology and PCCM this morning.  Remains on IV antibiotics, IV steroids.  No other specific questions or concerns at this time.  Denies headache, no visual changes, no fever/chills/night sweats, no nausea/vomiting/diarrhea, no chest pain, no palpitations, no abdominal pain, no weakness, no fatigue, no paresthesias.  No acute events overnight per nursing staff.  Objective: Vitals:   12/24/20 0800 12/24/20 0822 12/24/20 0853 12/24/20 0858  BP: 96/63     Pulse: 74     Resp: (!) 21     Temp:  (!) 97.5 F (36.4 C)    TempSrc:  Axillary    SpO2: 91%  98% 100%  Weight:      Height:        Intake/Output Summary (Last 24 hours) at 12/24/2020 1152 Last data filed at 12/24/2020 1100 Gross per 24 hour  Intake 1269.99 ml  Output 1550 ml  Net -280.01 ml   Filed Weights   12/22/20 0930 12/23/20 0400  Weight: 93 kg 91.1 kg    Examination:  General exam: Appears calm and comfortable  Respiratory system: Breath sounds slightly decreased bilateral bases, no crackles/wheezing, normal respiratory effort without accessory muscle use, on 12 L high flow nasal cannula Cardiovascular system: S1 & S2 heard, RRR. No JVD, murmurs, rubs, gallops or clicks. No pedal edema. Gastrointestinal system: Abdomen is nondistended, soft and nontender. No organomegaly or masses felt. Normal bowel sounds heard. Central nervous system: Alert and oriented. No focal neurological deficits. Extremities: Symmetric 5 x 5 power. Skin: No rashes, lesions or ulcers Psychiatry: Judgement and insight appear normal. Mood & affect  appropriate.     Data Reviewed: I have personally reviewed following labs and imaging studies  CBC: Recent Labs  Lab 12/22/20 1000 12/22/20 1035 12/23/20 0239 12/24/20 0236  WBC 1.9*  --  1.5* 1.8*  NEUTROABS 0.9*  --   --  1.3*  HGB 12.7* 12.6* 10.3* 9.7*  HCT 37.2* 37.0* 30.6* 29.1*  MCV 84.5  --  85.2 85.6  PLT 294  --  260 834   Basic Metabolic Panel: Recent Labs  Lab 12/22/20 1003 12/22/20 1035 12/23/20 0239 12/24/20 0236  NA 129* 128* 131* 132*  K 4.1 4.7 4.3 3.7  CL 91* 92* 98 102  CO2 27  --  25 22  GLUCOSE 112* 106* 168* 199*  BUN '16 19 20 19  ' CREATININE 1.07 1.20 0.97 0.77  CALCIUM 8.7*  --  8.3* 8.0*   GFR: Estimated Creatinine Clearance: 114.8 mL/min (by C-G formula based on SCr of 0.77 mg/dL). Liver Function Tests: Recent Labs  Lab 12/22/20 1003 12/23/20 0239 12/24/20 0236  AST 33 27 28  ALT '18 15 18  ' ALKPHOS 68 58 49  BILITOT 1.0 0.7 0.5  PROT 7.0 6.1* 5.7*  ALBUMIN 2.5* 2.3* 2.3*   No results for input(s): LIPASE, AMYLASE in the last 168 hours. No results for input(s): AMMONIA in the last 168 hours. Coagulation Profile: Recent Labs  Lab 12/22/20 1003  INR 2.2*   Cardiac Enzymes: No results for input(s): CKTOTAL, CKMB, CKMBINDEX, TROPONINI in the last 168 hours. BNP (last 3 results) No results for input(s): PROBNP in the last 8760 hours. HbA1C: No results for input(s):  HGBA1C in the last 72 hours. CBG: No results for input(s): GLUCAP in the last 168 hours. Lipid Profile: No results for input(s): CHOL, HDL, LDLCALC, TRIG, CHOLHDL, LDLDIRECT in the last 72 hours. Thyroid Function Tests: No results for input(s): TSH, T4TOTAL, FREET4, T3FREE, THYROIDAB in the last 72 hours. Anemia Panel: No results for input(s): VITAMINB12, FOLATE, FERRITIN, TIBC, IRON, RETICCTPCT in the last 72 hours. Sepsis Labs: Recent Labs  Lab 12/22/20 1003 12/22/20 1234 12/23/20 0239 12/24/20 0236  PROCALCITON  --  0.22 0.39 0.20  LATICACIDVEN 2.0* 1.3   --   --     Recent Results (from the past 240 hour(s))  Blood Culture (routine x 2)     Status: None (Preliminary result)   Collection Time: 12/22/20 10:03 AM   Specimen: BLOOD  Result Value Ref Range Status   Specimen Description   Final    BLOOD BLOOD RIGHT FOREARM Performed at [facility] [address] [place], [place] [address]    Special Requests   Final    BOTTLES DRAWN AEROBIC ONLY Blood Culture adequate volume Performed at [facility] [address] [place], [place] [address]    Culture   Final    NO GROWTH 2 DAYS Performed at [facility] [address], [place], [place] [address]    Report Status PENDING  Incomplete  Blood Culture (routine x 2)     Status: None (Preliminary result)   Collection Time: 12/22/20 10:08 AM   Specimen: BLOOD  Result Value Ref Range Status   Specimen Description   Final    BLOOD LEFT ANTECUBITAL Performed at [facility] [address], [place], [place] [address]    Special Requests   Final    BOTTLES DRAWN AEROBIC AND ANAEROBIC Blood Culture adequate volume Performed at [facility] [address], [place], [place] [address]    Culture   Final    NO GROWTH 2 DAYS Performed at [facility] [address] [place], [place] [address]    Report Status PENDING  Incomplete  MRSA Next Gen by PCR, Nasal     Status: None   Collection Time: 12/22/20 10:08 AM   Specimen: Nasopharyngeal Swab; Nasal Swab  Result Value Ref Range Status   MRSA by PCR Next Gen NOT DETECTED NOT DETECTED Final    Comment: (NOTE) The GeneXpert MRSA Assay (FDA approved for NASAL specimens only), is one component of a comprehensive MRSA colonization surveillance program. It is not intended to diagnose MRSA infection nor to guide or monitor treatment for MRSA infections. Test performance is not FDA approved in patients less than 78 years old. Performed at [facility], [place] [address] [place], [place] [address]   Resp Panel by RT-PCR (Flu A&B, Covid) Nasopharyngeal Swab     Status: None   Collection Time: 12/22/20 10:27 AM   Specimen: Nasopharyngeal Swab; Nasopharyngeal(NP) swabs in vial transport medium  Result Value Ref Range Status   SARS Coronavirus 2 by RT PCR NEGATIVE NEGATIVE Final    Comment: (NOTE) SARS-CoV-2 target nucleic acids are NOT DETECTED.  The SARS-CoV-2 RNA is generally detectable in upper respiratory specimens during the acute phase of infection. The lowest concentration of SARS-CoV-2 viral copies this assay can detect is 138 copies/mL. A negative result does not preclude SARS-Cov-2 infection and should not be used as the sole basis for treatment or other patient management decisions. A negative result may occur with  improper specimen collection/handling, submission of specimen other than nasopharyngeal swab, presence of viral mutation(s) within the areas targeted by this assay, and inadequate number of viral copies(<138 copies/mL). A negative result must be combined with clinical observations, patient history, and epidemiological information. The expected result is Negative.  Fact Sheet for Patients:  EntrepreneurPulse.com.au  Fact Sheet for Healthcare Providers:  IncredibleEmployment.be  This test is no t yet approved or cleared by the [place] FDA and  has been authorized for detection and/or diagnosis of SARS-CoV-2 by FDA under an Emergency Use Authorization (EUA). This EUA will remain  in effect (meaning this test can be used) for the duration of the COVID-19 declaration under Section 564(b)(1) of the Act, 21 U.S.C.section 360bbb-3(b)(1), unless the authorization is terminated  or revoked sooner.       Influenza A by PCR NEGATIVE NEGATIVE Final   Influenza B by PCR NEGATIVE NEGATIVE Final    Comment: (NOTE) The Xpert Xpress SARS-CoV-2/FLU/RSV plus assay  is intended as an aid in the diagnosis of influenza from Nasopharyngeal swab specimens and should not be used as a sole basis for treatment. Nasal washings and aspirates are unacceptable for Xpert Xpress SARS-CoV-2/FLU/RSV testing.  Fact Sheet for Patients: EntrepreneurPulse.com.au  Fact Sheet for Healthcare Providers: IncredibleEmployment.be  This test is not yet approved or cleared by the [place] FDA and has been authorized for detection and/or diagnosis of SARS-CoV-2 by FDA under an Emergency Use Authorization (EUA). This EUA will remain in effect (meaning this test can be used) for the duration of the COVID-19 declaration under Section 564(b)(1) of the Act, 21 U.S.C. section 360bbb-3(b)(1), unless the authorization is terminated or revoked.  Performed at [facility], [place] [address], [place], [place] [address]   Urine Culture     Status: None   Collection Time: 12/22/20 12:32 PM   Specimen: In/Out Cath Urine  Result Value Ref Range Status   Specimen Description   Final    IN/OUT CATH URINE Performed at [facility], [place] [address], [place], [place] [address]    Special Requests   Final    NONE Performed at [facility], [place] [address] [place], [place] [address]    Culture   Final    NO GROWTH Performed at [facility], [place] [address] [place], [place] [address]    Report Status 12/23/2020 FINAL  Final  Respiratory (~20 pathogens) panel by PCR     Status: None   Collection Time: 12/22/20  1:47 PM   Specimen: Nasopharyngeal Swab; Respiratory  Result Value Ref Range Status   Adenovirus NOT DETECTED NOT DETECTED Final   Coronavirus 229E NOT DETECTED NOT DETECTED Final    Comment: (NOTE) The Coronavirus on the Respiratory Panel, DOES NOT test for the novel  Coronavirus (2019 nCoV)    Coronavirus HKU1 NOT DETECTED NOT DETECTED Final   Coronavirus NL63 NOT DETECTED  NOT DETECTED Final   Coronavirus OC43 NOT DETECTED NOT DETECTED Final   Metapneumovirus NOT DETECTED NOT DETECTED Final   Rhinovirus / Enterovirus NOT DETECTED NOT DETECTED Final   Influenza A NOT DETECTED NOT DETECTED Final   Influenza B NOT DETECTED NOT DETECTED Final   Parainfluenza Virus 1 NOT DETECTED NOT DETECTED Final   Parainfluenza Virus 2 NOT DETECTED NOT DETECTED Final   Parainfluenza Virus 3 NOT DETECTED NOT DETECTED Final   Parainfluenza Virus 4 NOT DETECTED NOT DETECTED Final   Respiratory Syncytial Virus NOT DETECTED NOT DETECTED Final  Bordetella pertussis NOT DETECTED NOT DETECTED Final   Bordetella Parapertussis NOT DETECTED NOT DETECTED Final   Chlamydophila pneumoniae NOT DETECTED NOT DETECTED Final   Mycoplasma pneumoniae NOT DETECTED NOT DETECTED Final    Comment: Performed at [facility], [place] [address] [place], [place] [address]         Radiology Studies: DG CHEST PORT 1 VIEW  Result Date: 12/24/2020 CLINICAL DATA:  Pneumonitis, assess for changes. EXAM: PORTABLE CHEST 1 VIEW COMPARISON:  December 22, 2020. FINDINGS: Trachea midline. Cardiomediastinal contours and hilar structures are stable. Persistent interstitial and alveolar opacities superimposed on cystic changes in the RIGHT chest. Stable appearance of the chest since previous imaging. On limited assessment no acute skeletal process. IMPRESSION: Stable low lung volumes with diffuse interstitial and alveolar opacities with cystic and or cavitary changes in the RIGHT chest. Electronically Signed   By: Zetta Bills M.D.   On: 12/24/2020 09:57        Scheduled Meds:  acyclovir  800 mg Oral BID   amiodarone  200 mg Oral QPM   apixaban  2.5 mg Oral BID   azithromycin  500 mg Oral Daily   Chlorhexidine Gluconate Cloth  6 each Topical Daily   ipratropium  0.5 mg Nebulization Q6H WA   itraconazole  200 mg Oral Daily   levalbuterol  0.63 mg Nebulization Q6H WA   mouth rinse  15 mL Mouth Rinse BID    methylPREDNISolone (SOLU-MEDROL) injection  125 mg Intravenous Q12H   pantoprazole  40 mg Oral QHS   PARoxetine  10 mg Oral Daily   umeclidinium-vilanterol  1 puff Inhalation Daily   Continuous Infusions:  ceFEPime (MAXIPIME) IV Stopped (12/24/20 0646)     LOS: 1 day    Time spent: 39 minutes spent on chart review, discussion with nursing staff, consultants, updating family and interview/physical exam; more than 50% of that time was spent in counseling and/or coordination of care.    Mandrell Vangilder J [place] DO [facility] Available via Epic secure chat 7am-7pm After these hours, please refer to coverage provider listed on [email or web address] 12/24/2020, 11:52 AM

## 2020-12-25 ENCOUNTER — Telehealth: Payer: Self-pay | Admitting: Critical Care Medicine

## 2020-12-25 DIAGNOSIS — D702 Other drug-induced agranulocytosis: Secondary | ICD-10-CM | POA: Diagnosis not present

## 2020-12-25 DIAGNOSIS — T50905A Adverse effect of unspecified drugs, medicaments and biological substances, initial encounter: Secondary | ICD-10-CM | POA: Diagnosis not present

## 2020-12-25 DIAGNOSIS — J449 Chronic obstructive pulmonary disease, unspecified: Secondary | ICD-10-CM | POA: Diagnosis not present

## 2020-12-25 DIAGNOSIS — J9601 Acute respiratory failure with hypoxia: Secondary | ICD-10-CM | POA: Diagnosis not present

## 2020-12-25 DIAGNOSIS — J189 Pneumonia, unspecified organism: Secondary | ICD-10-CM | POA: Diagnosis not present

## 2020-12-25 LAB — CBC WITH DIFFERENTIAL/PLATELET
Abs Immature Granulocytes: 0.02 10*3/uL (ref 0.00–0.07)
Basophils Absolute: 0 10*3/uL (ref 0.0–0.1)
Basophils Relative: 0 %
Eosinophils Absolute: 0 10*3/uL (ref 0.0–0.5)
Eosinophils Relative: 0 %
HCT: 30 % — ABNORMAL LOW (ref 39.0–52.0)
Hemoglobin: 9.8 g/dL — ABNORMAL LOW (ref 13.0–17.0)
Immature Granulocytes: 1 %
Lymphocytes Relative: 12 %
Lymphs Abs: 0.2 10*3/uL — ABNORMAL LOW (ref 0.7–4.0)
MCH: 28.5 pg (ref 26.0–34.0)
MCHC: 32.7 g/dL (ref 30.0–36.0)
MCV: 87.2 fL (ref 80.0–100.0)
Monocytes Absolute: 0.3 10*3/uL (ref 0.1–1.0)
Monocytes Relative: 14 %
Neutro Abs: 1.4 10*3/uL — ABNORMAL LOW (ref 1.7–7.7)
Neutrophils Relative %: 73 %
Platelets: 314 10*3/uL (ref 150–400)
RBC: 3.44 MIL/uL — ABNORMAL LOW (ref 4.22–5.81)
RDW: 14.5 % (ref 11.5–15.5)
WBC: 1.9 10*3/uL — ABNORMAL LOW (ref 4.0–10.5)
nRBC: 0 % (ref 0.0–0.2)

## 2020-12-25 LAB — COMPREHENSIVE METABOLIC PANEL
ALT: 26 U/L (ref 0–44)
AST: 27 U/L (ref 15–41)
Albumin: 2.3 g/dL — ABNORMAL LOW (ref 3.5–5.0)
Alkaline Phosphatase: 46 U/L (ref 38–126)
Anion gap: 6 (ref 5–15)
BUN: 14 mg/dL (ref 6–20)
CO2: 25 mmol/L (ref 22–32)
Calcium: 8.3 mg/dL — ABNORMAL LOW (ref 8.9–10.3)
Chloride: 103 mmol/L (ref 98–111)
Creatinine, Ser: 0.78 mg/dL (ref 0.61–1.24)
GFR, Estimated: >60 mL/min (ref >60)
Glucose, Bld: 183 mg/dL — ABNORMAL HIGH (ref 70–99)
Potassium: 3.9 mmol/L (ref 3.5–5.1)
Sodium: 134 mmol/L — ABNORMAL LOW (ref 135–145)
Total Bilirubin: 0.5 mg/dL (ref 0.3–1.2)
Total Protein: 5.6 g/dL — ABNORMAL LOW (ref 6.5–8.1)

## 2020-12-25 MED ORDER — PREDNISONE 50 MG PO TABS: 60.0000 mg | ORAL_TABLET | Freq: Every day | ORAL | Status: DC

## 2020-12-25 NOTE — Telephone Encounter (Signed)
Appt scheduled 01/12/2021 at 10:30. Patient is aware and voiced his understanding.  Nothing further needed at this time.

## 2020-12-25 NOTE — Progress Notes (Signed)
John [name] seems to be getting better slowly but surely.  His x-ray yesterday did not show any worsening of his pneumonitis.  He is on broad spectrum coverage for bacteria, viruses, and fungus.  He does have the leukopenia/neutropenia.  His labs show a white count 1.9.  Hemoglobin 9.8.  Platelet count 314.  Sodium 134.  Potassium 3.9.  His BUN is 14 creatinine 0.78.  His calcium is 8.3 with an albumin of 2.3.  He ate well yesterday.  The steroids do affect his sleep.  However he seems to sleep little bit better yesterday.  So far, cultures were all negative.  He sat in a chair most of the day yesterday.  It looks like his oxygen levels seem to be doing a bit better.    His vital signs show temperature 97.7.  Pulse 79.  Blood pressure 104/62.  Oxygen saturation is 94%.  His lungs sound better.  I do not hear as much wheezing.  He has good air movement bilaterally.  Cardiac exam regular rate and rhythm.  Abdomen is soft.  He has decent bowel sounds.  There is no fluid wave.  There is no guarding or rebound tenderness.  Extremity shows no clubbing, cyanosis or edema.  Neurological exam is nonfocal.  I did speak to his wife on the cell phone.  I did give her an update.  We will see what his chest x-ray shows tomorrow.  If things continue to improve, the maybe we start pulling back on his antibiotics.  Hopefully, this is from the immunotherapy.  If that is the case, he will need to be on a slow steroid taper.  I do appreciate the great care he is getting from the staff in the ICU.  I know that they are so compassionate and so professional.  [name], MD  Psalm 34:4

## 2020-12-25 NOTE — Progress Notes (Signed)
Received report and taking over care for patient.

## 2020-12-25 NOTE — Telephone Encounter (Signed)
Please schedule for follow up in the office in ~2 weeks.   Julian Hy, DO 12/25/20 10:57 AM [facility]

## 2020-12-25 NOTE — Progress Notes (Signed)
PROGRESS NOTE    John [name]  ASN:[id number] DOB: 1960/07/12 DOA: 12/22/2020 PCP: [name], MD    Brief Narrative:  John [name] is a 60 year old male with past medical history significant for stage 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 who just completed 11th cycle of Keytruda, paroxysmal atrial fibrillation who presented to [facility] ED on 11/7 with progressive shortness of breath x1 week.  Associated with increased productive cough with white sputum and sweats/malaise/fatigue.  Recently seen in outpatient pulmonology office and prescribed Augmentin and prednisone with supple oxygen.  Patient's shortness of breath progressed on 6 L nasal cannula which prompted evaluation in the ED.  In the ED, patient requiring 15 L NRB, chest x-ray with diffuse bilateral heterogeneous opacities.  CTA chest without PE but findings consistent with progressive pneumonitis.  Lactic acid 2.0.  WBC 1.9.  ABG with pH 7.39, PCO2 46, PO2 26.  Patient was given IV Solu-Medrol, vancomycin, cefepime, azithromycin, Xopenex nebs.  Hospitalist service consulted for further evaluation and management of acute hypoxic respiratory failure secondary to commune acquired pneumonia versus drug-induced pneumonitis.   Assessment & Plan:   Principal Problem:   Drug-induced pneumonitis Active Problems:   COPD with asthma ([place])   Stage 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 (HCC)   Hyponatremia   PAF (paroxysmal atrial fibrillation) (HCC)   Severe sepsis (HCC)   Acute on chronic respiratory failure with hypoxia (HCC)   Leukocytopenia   Acute hypoxic respite failure, POA Severe sepsis, POA Drug-induced pneumonitis vs community-acquired pneumonia Patient presenting to the ED with progressive shortness of breath despite outpatient treatment by pulmonology with Augmentin, prednisone and started on supplemental oxygen; which he is not on oxygen at baseline.  Chest x-ray with diffuse bilateral heterogeneous airspace  opacities and CT angiogram chest negative for PE with worsening aeration of lungs bilaterally concerning for progressively worsening pneumonitis likely secondary to immunotherapy treatment with Keytruda.  Required 15 L NRB on ED presentation.  Met criteria on admission with leukopenia, tachycardia, tachycardia and hypotension that was responsive to IV fluid hydration.  PCCM signed off 11/10, outpatient follow-up 2 weeks --Oncology following, appreciate assistance --Solu-Medrol 125 mg IV q24h; transition to prednisone 60 mg p.o. PO daily on 11/11 --Azithromycin 500 mg PO daily x 5 days --Cefepime 2 g IV q8h x 5 days --Anoro Ellipta 1 puff daily --Xopenex nebs q6h while awake and PRN --Continue supplemental oxygen, maintain SPO2 greater than 88%, on 6 L HFNC  --Transfer out of ICU today  Leukopenia Etiology likely secondary to chemotherapy. --Continue prophylactic antiviral/antifungal with itraconazole and acyclovir --CBC daily  Hyponatremia Sodium 129 on admission, likely secondary to poor oral intake.  Improved. --Na 129>>132>134 --BMP daily  Paroxysmal atrial fibrillation Holding home metoprolol succinate 67m qHS due to borderline hypotension. --Amiodarone 2041mPO daily --Eliquis 2.5 mg PO BID (on xarelto at home but changed to Eliquis due to compatibility with itraconazole)  Stage IV squamous cell carcinoma right lung --Further per oncology.  COPD --Started on Anoro during hospitalization.  Pulmonology recommends LAMA/LABA on discharge (Stioloto vs Anoro vs Bevespri) --continue oxygen as above  HLD: Atorvastatin 10 mg p.o. daily  Depression/anxiety: Paxil 10 mg p.o. daily  GERD: Continue PPI   DVT prophylaxis: apixaban (ELIQUIS) tablet 2.5 mg Start: 12/23/20 2200 apixaban (ELIQUIS) tablet 2.5 mg    Code Status: Full Code Family Communication: Updated patient spouse present at bedside this morning  Disposition Plan:  Level of care: Telemetry Status is:  Inpatient  Remains inpatient appropriate because:  Continues on IV steroids, IV antibiotics, high O2 demand    Consultants:  PCCM, Dr. Carlis Abbott Medical oncology, [name]  Procedures:  None  Antimicrobials:  Azithromycin 11/7>> Cefepime 11/7>> Itraconazole 11/8>> Acyclovir 11/8>> Vancomycin 11/7 -11/7    Subjective: Patient seen examined at bedside, resting comfortably.  Lying in bed.  Having neb treatment by RT.  Breathing continues to improve daily.  Seen by medical oncology and PCCM today.  Oxygen titrated down to 6 L high flow nasal cannula.  No family present at bedside this morning.  Pulmonology signing off with recommendations of transition to oral steroids tomorrow and outpatient follow-up in 2 weeks.  Okay for transfer out of ICU today.  No other specific questions or concerns at this time.  Denies headache, no visual changes, no fever/chills/night sweats, no nausea/vomiting/diarrhea, no chest pain, no palpitations, no abdominal pain, no weakness, no fatigue, no paresthesias.  No acute events overnight per nursing staff.  Objective: Vitals:   12/25/20 0650 12/25/20 0758 12/25/20 0800 12/25/20 0802  BP:   118/69   Pulse: 79  72   Resp: (!) 27  (!) 24   Temp:   97.6 F (36.4 C)   TempSrc:   Axillary   SpO2: 93% 99% 95% 97%  Weight:      Height:        Intake/Output Summary (Last 24 hours) at 12/25/2020 1115 Last data filed at 12/25/2020 0600 Gross per 24 hour  Intake 600 ml  Output 1600 ml  Net -1000 ml   Filed Weights   12/22/20 0930 12/23/20 0400  Weight: 93 kg 91.1 kg    Examination:  General exam: Appears calm and comfortable  Respiratory system: Breath sounds slightly decreased bilateral bases, no crackles/wheezing, normal respiratory effort without accessory muscle use, on 6 L high flow nasal cannula Cardiovascular system: S1 & S2 heard, RRR. No JVD, murmurs, rubs, gallops or clicks. No pedal edema. Gastrointestinal system: Abdomen is nondistended,  soft and nontender. No organomegaly or masses felt. Normal bowel sounds heard. Central nervous system: Alert and oriented. No focal neurological deficits. Extremities: Symmetric 5 x 5 power. Skin: No rashes, lesions or ulcers Psychiatry: Judgement and insight appear normal. Mood & affect appropriate.     Data Reviewed: I have personally reviewed following labs and imaging studies  CBC: Recent Labs  Lab 12/22/20 1000 12/22/20 1035 12/23/20 0239 12/24/20 0236 12/25/20 0235  WBC 1.9*  --  1.5* 1.8* 1.9*  NEUTROABS 0.9*  --   --  1.3* 1.4*  HGB 12.7* 12.6* 10.3* 9.7* 9.8*  HCT 37.2* 37.0* 30.6* 29.1* 30.0*  MCV 84.5  --  85.2 85.6 87.2  PLT 294  --  260 307 732   Basic Metabolic Panel: Recent Labs  Lab 12/22/20 1003 12/22/20 1035 12/23/20 0239 12/24/20 0236 12/25/20 0235  NA 129* 128* 131* 132* 134*  K 4.1 4.7 4.3 3.7 3.9  CL 91* 92* 98 102 103  CO2 27  --  '25 22 25  ' GLUCOSE 112* 106* 168* 199* 183*  BUN '16 19 20 19 14  ' CREATININE 1.07 1.20 0.97 0.77 0.78  CALCIUM 8.7*  --  8.3* 8.0* 8.3*   GFR: Estimated Creatinine Clearance: 114.8 mL/min (by C-G formula based on SCr of 0.78 mg/dL). Liver Function Tests: Recent Labs  Lab 12/22/20 1003 12/23/20 0239 12/24/20 0236 12/25/20 0235  AST 33 '27 28 27  ' ALT '18 15 18 26  ' ALKPHOS 68 58 49 46  BILITOT 1.0 0.7 0.5 0.5  PROT  7.0 6.1* 5.7* 5.6*  ALBUMIN 2.5* 2.3* 2.3* 2.3*   No results for input(s): LIPASE, AMYLASE in the last 168 hours. No results for input(s): AMMONIA in the last 168 hours. Coagulation Profile: Recent Labs  Lab 12/22/20 1003  INR 2.2*   Cardiac Enzymes: No results for input(s): CKTOTAL, CKMB, CKMBINDEX, TROPONINI in the last 168 hours. BNP (last 3 results) No results for input(s): PROBNP in the last 8760 hours. HbA1C: No results for input(s): HGBA1C in the last 72 hours. CBG: No results for input(s): GLUCAP in the last 168 hours. Lipid Profile: No results for input(s): CHOL, HDL, LDLCALC,  TRIG, CHOLHDL, LDLDIRECT in the last 72 hours. Thyroid Function Tests: No results for input(s): TSH, T4TOTAL, FREET4, T3FREE, THYROIDAB in the last 72 hours. Anemia Panel: No results for input(s): VITAMINB12, FOLATE, FERRITIN, TIBC, IRON, RETICCTPCT in the last 72 hours. Sepsis Labs: Recent Labs  Lab 12/22/20 1003 12/22/20 1234 12/23/20 0239 12/24/20 0236  PROCALCITON  --  0.22 0.39 0.20  LATICACIDVEN 2.0* 1.3  --   --     Recent Results (from the past 240 hour(s))  Blood Culture (routine x 2)     Status: None (Preliminary result)   Collection Time: 12/22/20 10:03 AM   Specimen: BLOOD  Result Value Ref Range Status   Specimen Description   Final    BLOOD BLOOD RIGHT FOREARM Performed at [facility] [address], [place], [place] [address]    Special Requests   Final    BOTTLES DRAWN AEROBIC ONLY Blood Culture adequate volume Performed at [facility] [address], [place], [place] [address]    Culture   Final    NO GROWTH 3 DAYS Performed at [facility], [place] [address], [place], [place] [address]    Report Status PENDING  Incomplete  Blood Culture (routine x 2)     Status: None (Preliminary result)   Collection Time: 12/22/20 10:08 AM   Specimen: BLOOD  Result Value Ref Range Status   Specimen Description   Final    BLOOD LEFT ANTECUBITAL Performed at [facility] [address], [place], [place] [address]    Special Requests   Final    BOTTLES DRAWN AEROBIC AND ANAEROBIC Blood Culture adequate volume Performed at [facility] [address], [place], [place] [address]    Culture   Final    NO GROWTH 3 DAYS Performed at [facility], [place] [address] [place], [place] [address]    Report Status PENDING  Incomplete  MRSA Next Gen by PCR, Nasal     Status: None   Collection Time: 12/22/20 10:08 AM   Specimen: Nasopharyngeal Swab; Nasal Swab  Result Value Ref Range Status    MRSA by PCR Next Gen NOT DETECTED NOT DETECTED Final    Comment: (NOTE) The GeneXpert MRSA Assay (FDA approved for NASAL specimens only), is one component of a comprehensive MRSA colonization surveillance program. It is not intended to diagnose MRSA infection nor to guide or monitor treatment for MRSA infections. Test performance is not FDA approved in patients less than 27 years old. Performed at [facility], [place] [address], [place], [place] [address]   Resp Panel by RT-PCR (Flu A&B, Covid) Nasopharyngeal Swab     Status: None   Collection Time: 12/22/20 10:27 AM   Specimen: Nasopharyngeal Swab; Nasopharyngeal(NP) swabs in vial transport medium  Result Value Ref Range Status   SARS Coronavirus  2 by RT PCR NEGATIVE NEGATIVE Final    Comment: (NOTE) SARS-CoV-2 target nucleic acids are NOT DETECTED.  The SARS-CoV-2 RNA is generally detectable in upper respiratory specimens during the acute phase of infection. The lowest concentration of SARS-CoV-2 viral copies this assay can detect is 138 copies/mL. A negative result does not preclude SARS-Cov-2 infection and should not be used as the sole basis for treatment or other patient management decisions. A negative result may occur with  improper specimen collection/handling, submission of specimen other than nasopharyngeal swab, presence of viral mutation(s) within the areas targeted by this assay, and inadequate number of viral copies(<138 copies/mL). A negative result must be combined with clinical observations, patient history, and epidemiological information. The expected result is Negative.  Fact Sheet for Patients:  EntrepreneurPulse.com.au  Fact Sheet for Healthcare Providers:  IncredibleEmployment.be  This test is no t yet approved or cleared by the [place] FDA and  has been authorized for detection and/or diagnosis of SARS-CoV-2 by FDA under an Emergency Use  Authorization (EUA). This EUA will remain  in effect (meaning this test can be used) for the duration of the COVID-19 declaration under Section 564(b)(1) of the Act, 21 U.S.C.section 360bbb-3(b)(1), unless the authorization is terminated  or revoked sooner.       Influenza A by PCR NEGATIVE NEGATIVE Final   Influenza B by PCR NEGATIVE NEGATIVE Final    Comment: (NOTE) The Xpert Xpress SARS-CoV-2/FLU/RSV plus assay is intended as an aid in the diagnosis of influenza from Nasopharyngeal swab specimens and should not be used as a sole basis for treatment. Nasal washings and aspirates are unacceptable for Xpert Xpress SARS-CoV-2/FLU/RSV testing.  Fact Sheet for Patients: EntrepreneurPulse.com.au  Fact Sheet for Healthcare Providers: IncredibleEmployment.be  This test is not yet approved or cleared by the [place] FDA and has been authorized for detection and/or diagnosis of SARS-CoV-2 by FDA under an Emergency Use Authorization (EUA). This EUA will remain in effect (meaning this test can be used) for the duration of the COVID-19 declaration under Section 564(b)(1) of the Act, 21 U.S.C. section 360bbb-3(b)(1), unless the authorization is terminated or revoked.  Performed at [facility], [place] [address] [place], [place] [address]   Urine Culture     Status: None   Collection Time: 12/22/20 12:32 PM   Specimen: In/Out Cath Urine  Result Value Ref Range Status   Specimen Description   Final    IN/OUT CATH URINE Performed at [facility], [place] [address] [place], [place] [address]    Special Requests   Final    NONE Performed at [facility], [place] [address] [place], [place] [address]    Culture   Final    NO GROWTH Performed at [facility], [place] [address] [place], [place] [address]    Report Status 12/23/2020 FINAL  Final  Respiratory (~20 pathogens) panel by PCR      Status: None   Collection Time: 12/22/20  1:47 PM   Specimen: Nasopharyngeal Swab; Respiratory  Result Value Ref Range Status   Adenovirus NOT DETECTED NOT DETECTED Final   Coronavirus 229E NOT DETECTED NOT DETECTED Final    Comment: (NOTE) The Coronavirus on the Respiratory Panel, DOES NOT test for the novel  Coronavirus (2019 nCoV)    Coronavirus HKU1 NOT DETECTED NOT DETECTED Final   Coronavirus NL63 NOT DETECTED NOT DETECTED Final   Coronavirus OC43 NOT DETECTED NOT DETECTED Final   Metapneumovirus NOT DETECTED NOT  DETECTED Final   Rhinovirus / Enterovirus NOT DETECTED NOT DETECTED Final   Influenza A NOT DETECTED NOT DETECTED Final   Influenza B NOT DETECTED NOT DETECTED Final   Parainfluenza Virus 1 NOT DETECTED NOT DETECTED Final   Parainfluenza Virus 2 NOT DETECTED NOT DETECTED Final   Parainfluenza Virus 3 NOT DETECTED NOT DETECTED Final   Parainfluenza Virus 4 NOT DETECTED NOT DETECTED Final   Respiratory Syncytial Virus NOT DETECTED NOT DETECTED Final   Bordetella pertussis NOT DETECTED NOT DETECTED Final   Bordetella Parapertussis NOT DETECTED NOT DETECTED Final   Chlamydophila pneumoniae NOT DETECTED NOT DETECTED Final   Mycoplasma pneumoniae NOT DETECTED NOT DETECTED Final    Comment: Performed at [facility], [place] [address], [place], [place] [address]         Radiology Studies: DG CHEST PORT 1 VIEW  Result Date: 12/24/2020 CLINICAL DATA:  Pneumonitis, assess for changes. EXAM: PORTABLE CHEST 1 VIEW COMPARISON:  December 22, 2020. FINDINGS: Trachea midline. Cardiomediastinal contours and hilar structures are stable. Persistent interstitial and alveolar opacities superimposed on cystic changes in the RIGHT chest. Stable appearance of the chest since previous imaging. On limited assessment no acute skeletal process. IMPRESSION: Stable low lung volumes with diffuse interstitial and alveolar opacities with cystic and or cavitary changes in the RIGHT chest.  Electronically Signed   By: Zetta Bills M.D.   On: 12/24/2020 09:57        Scheduled Meds:  acyclovir  800 mg Oral BID   amiodarone  200 mg Oral QPM   apixaban  2.5 mg Oral BID   azithromycin  500 mg Oral Daily   Chlorhexidine Gluconate Cloth  6 each Topical Daily   ipratropium  0.5 mg Nebulization Q6H WA   itraconazole  200 mg Oral Daily   levalbuterol  0.63 mg Nebulization Q6H WA   mouth rinse  15 mL Mouth Rinse BID   pantoprazole  40 mg Oral QHS   PARoxetine  10 mg Oral Daily   [START ON 12/26/2020] predniSONE  60 mg Oral Q breakfast   umeclidinium-vilanterol  1 puff Inhalation Daily   Continuous Infusions:  ceFEPime (MAXIPIME) IV Stopped (12/25/20 0550)     LOS: 2 days    Time spent: 37 minutes spent on chart review, discussion with nursing staff, consultants, updating family and interview/physical exam; more than 50% of that time was spent in counseling and/or coordination of care.    Jaime Grizzell J [place] DO [facility] Available via Epic secure chat 7am-7pm After these hours, please refer to coverage provider listed on [email or web address] 12/25/2020, 11:15 AM

## 2020-12-25 NOTE — Progress Notes (Signed)
NAME:  John [name], MRN:  [id number], DOB:  10-Feb-1961, LOS: 2 ADMISSION DATE:  12/22/2020, CONSULTATION DATE:  12/23/20 REFERRING MD:  Olevia Bowens, CHIEF COMPLAINT:  Dyspnea   History of Present Illness:  John [name] is a 60 y.o. 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  He reports increased productive cough with white sputum along with sweats and malaise/fatigue.   No known ill contacts.  He was seen by cardiology who did not feel this was a cardiac issue, so was seen at the pulmonary office several days ago and prescribed Augmentin and Prednisone along with nasal cannula O2.   He was using 6L over the last several days, but became so acutely short of breath that he could hardly walk and home O2 sats read 79%, so presented to the ED.   In the ED required 15L and non-rebreather.  CXR with diffuse bilateral heterogenous opacities unchanged from prior. CTA chest without PE, findings consistent with progressive pnemonitis.   Lactic acid 2.0,  WBC 1.9, ABG 7.39, pCO2 46, pO2 26, Na 129.  He was given Solumedrol, Vancomycin, Cefepime, Azithromycin, Xopanex and admitted to the hospital medicine service.  PCCM consulted.   Pertinent  Medical History   has a past medical history of Dyspnea, Family history of adverse reaction to anesthesia, GERD (gastroesophageal reflux disease), History of radiation therapy (04/24/2020-05/16/2020), PAF (paroxysmal atrial fibrillation) ([place]), Rheumatoid aortitis, and Squamous cell lung cancer ([place]).   Significant Hospital Events: Including procedures, antibiotic start and stop dates in addition to other pertinent events   11/7 presented to the ED with dyspnea and worsening O2 requirement, CTA chest consistent with worsening pneumonitis 11/8 improved respiratory status, off NRBM  Interim History / Subjective:  John [name] slept  well overnight and is feeling well this morning. His oxygen requirements are continuing to improve- he is down to 6L [place].  Objective   BP 118/69   Pulse 72   Temp 97.7 F (36.5 C) (Oral)   Resp (!) 24   Ht _0  (1.803 m)   Wt 91.1 kg   SpO2 97%   BMI 28.01 kg/m     Intake/Output Summary (Last 24 hours) at 12/25/2020 1941 Last data filed at 12/25/2020 0600 Gross per 24 hour  Intake 950 ml  Output 1800 ml  Net -850 ml       Last Weight  Most recent update: 12/23/2020  5:19 AM    Weight  91.1 kg (200 lb 13.4 oz)             General: middle aged man lying in bed in NAD HEENT: [place]/AT, eyes anicteric Neuro: awake, alert, moving all extremities, answering questions appropriately. CV: S1S2, RRR PULM:  breathing comfortably on 6L [place] GI: soft, NT Extremities: no cyanosis, no peripheral edema Skin: warm, dry, no rashes.   WBC 1.9 H/H 9.8/30.0 Na+  134  Resolved Hospital Problem list     Assessment & Plan:   Acute hypoxic respiratory failure due to acute pneumonitis from [facility], less likely amiodarone lung toxicity or bacterial pneumonia. RVP negative. Oxygen requirements slowly improving. Stage IV NSCLC -Con't steroids, can transition to oral tomorrow. If he worsens, go back to previous dosing. Will need taper later this admission and as an outpatient.  -Con't supplemental O2, wean as able.  -He will need an oxygen desaturation screening prior  to discharge. He has oxygen at home, but may require more than what his current concentrator can supply. -Recommend completing 5 days of antibiotics for CAP. -Oncology recommended prophylactic antiviral started on Acyclovir and Itraconazole -Ok to continue amiodarone for now. Worry about him developing recurrent Afib with RVR with hypoxia, which could cloud clinical picture. -con't atrovent and xopenex nebs PRN -Con't Anoro daily. Recommend discharge home on LAMA/LABA (Stiolto, Anoro, [place]- whichever is covered by his  insurance). -Recommend against rechallenging with PDL-1  Leukopenia, lymphopenia and neutropenia> felt to be related to RA -antiviral and antifungal prophylaxis -defer additional workup or management to Hematology  Anemia -transfuse for Hb<7 or hemodynamically significant bleeding  At risk for steroid myopathy and deconditioning -OOB mobility encouraged.  PCCM will sign off but we are available as needed. Recommend outpatient follow up (requested).  Wife updated at bedside during rounds. Stable to leave ICU today.   Best Practice (right click and "Reselect all SmartList Selections" daily)   Diet/type: Regular consistency (see orders) DVT prophylaxis: DOAC GI prophylaxis: N/A Lines: N/A Foley:  N/A Code Status:  full code Last date of multidisciplinary goals of care discussion [pending, wife at the bedside with patient and updated on plan of care]  Labs   CBC: Recent Labs  Lab 12/22/20 1000 12/22/20 1035 12/23/20 0239  WBC 1.9*  --  1.5*  NEUTROABS 0.9*  --   --   HGB 12.7* 12.6* 10.3*  HCT 37.2* 37.0* 30.6*  MCV 84.5  --  85.2  PLT 294  --  260     Basic Metabolic Panel: Recent Labs  Lab 12/22/20 1003 12/22/20 1035 12/23/20 0239  NA 129* 128* 131*  K 4.1 4.7 4.3  CL 91* 92* 98  CO2 27  --  25  GLUCOSE 112* 106* 168*  BUN _0 CREATININE 1.07 1.20 0.97  CALCIUM 8.7*  --  8.3*    GFR: Estimated Creatinine Clearance: 94.6 mL/min (by C-G formula based on SCr of 0.97 mg/dL). Recent Labs  Lab 12/22/20 1000 12/22/20 1003 12/22/20 1234 12/23/20 0239  PROCALCITON  --   --  0.22 0.39  WBC 1.9*  --   --  1.5*  LATICACIDVEN  --  2.0* 1.3  --      Julian Hy, DO 12/25/20 10:55 AM [facility]

## 2020-12-26 ENCOUNTER — Inpatient Hospital Stay (HOSPITAL_COMMUNITY): Payer: 59

## 2020-12-26 DIAGNOSIS — J189 Pneumonia, unspecified organism: Secondary | ICD-10-CM | POA: Diagnosis not present

## 2020-12-26 DIAGNOSIS — T50905A Adverse effect of unspecified drugs, medicaments and biological substances, initial encounter: Secondary | ICD-10-CM | POA: Diagnosis not present

## 2020-12-26 LAB — CBC WITH DIFFERENTIAL/PLATELET
Abs Immature Granulocytes: 0.02 10*3/uL (ref 0.00–0.07)
Basophils Absolute: 0 10*3/uL (ref 0.0–0.1)
Basophils Relative: 0 %
Eosinophils Absolute: 0 10*3/uL (ref 0.0–0.5)
Eosinophils Relative: 0 %
HCT: 30.2 % — ABNORMAL LOW (ref 39.0–52.0)
Hemoglobin: 10.1 g/dL — ABNORMAL LOW (ref 13.0–17.0)
Immature Granulocytes: 1 %
Lymphocytes Relative: 12 %
Lymphs Abs: 0.3 10*3/uL — ABNORMAL LOW (ref 0.7–4.0)
MCH: 28.7 pg (ref 26.0–34.0)
MCHC: 33.4 g/dL (ref 30.0–36.0)
MCV: 85.8 fL (ref 80.0–100.0)
Monocytes Absolute: 0.3 10*3/uL (ref 0.1–1.0)
Monocytes Relative: 16 %
Neutro Abs: 1.5 10*3/uL — ABNORMAL LOW (ref 1.7–7.7)
Neutrophils Relative %: 71 %
Platelets: 311 10*3/uL (ref 150–400)
RBC: 3.52 MIL/uL — ABNORMAL LOW (ref 4.22–5.81)
RDW: 14.2 % (ref 11.5–15.5)
WBC: 2.2 10*3/uL — ABNORMAL LOW (ref 4.0–10.5)
nRBC: 0 % (ref 0.0–0.2)

## 2020-12-26 LAB — COMPREHENSIVE METABOLIC PANEL
ALT: 46 U/L — ABNORMAL HIGH (ref 0–44)
AST: 35 U/L (ref 15–41)
Albumin: 2.4 g/dL — ABNORMAL LOW (ref 3.5–5.0)
Alkaline Phosphatase: 48 U/L (ref 38–126)
Anion gap: 5 (ref 5–15)
BUN: 17 mg/dL (ref 6–20)
CO2: 27 mmol/L (ref 22–32)
Calcium: 8.4 mg/dL — ABNORMAL LOW (ref 8.9–10.3)
Chloride: 102 mmol/L (ref 98–111)
Creatinine, Ser: 0.86 mg/dL (ref 0.61–1.24)
GFR, Estimated: >60 mL/min (ref >60)
Glucose, Bld: 167 mg/dL — ABNORMAL HIGH (ref 70–99)
Potassium: 4.1 mmol/L (ref 3.5–5.1)
Sodium: 134 mmol/L — ABNORMAL LOW (ref 135–145)
Total Bilirubin: 0.4 mg/dL (ref 0.3–1.2)
Total Protein: 5.6 g/dL — ABNORMAL LOW (ref 6.5–8.1)

## 2020-12-26 MED ORDER — LEVALBUTEROL HCL 0.63 MG/3ML IN NEBU
0.6300 mg | INHALATION_SOLUTION | Freq: Three times a day (TID) | RESPIRATORY_TRACT | Status: DC
  Administered 2020-12-26 – 2020-12-30 (×12): 0.63 mg via RESPIRATORY_TRACT
  Filled 2020-12-26 (×12): qty 3

## 2020-12-26 MED ORDER — IPRATROPIUM BROMIDE 0.02 % IN SOLN
0.5000 mg | Freq: Three times a day (TID) | RESPIRATORY_TRACT | Status: DC
  Administered 2020-12-26 – 2020-12-30 (×12): 0.5 mg via RESPIRATORY_TRACT
  Filled 2020-12-26 (×12): qty 2.5

## 2020-12-26 MED ORDER — PREDNISONE 20 MG PO TABS
80.0000 mg | ORAL_TABLET | Freq: Every day | ORAL | Status: AC
  Administered 2020-12-26 – 2020-12-30 (×5): 80 mg via ORAL
  Filled 2020-12-26 (×3): qty 4
  Filled 2020-12-26: qty 8
  Filled 2020-12-26: qty 4

## 2020-12-26 NOTE — Progress Notes (Signed)
Mr.  [name] has moved up to 4 W.  He is improving.  He did have a chest x-ray today.  There is no report yet from radiology.  However when I looked at it, it certainly did not look any worse.  It may have looked a little bit better.  His oxygen saturation continues to improve.  He is on high-dose steroids.  I will try to switch him over to oral.  His white cell count is coming up slowly.  This certainly could indicate that this might be an autoimmune issue with the white blood cells (which I think it is from the rheumatoid arthritis) as he is on steroids and the white cells are trending upward.  He is not neutropenic.  He is eating.  He is more active.  There is no pain.  He is having no diarrhea.  There is been no bleeding.  He has a little bit of a cough.  He is still taking nebulizers.  His labs show white cell count 2.2.  Hemoglobin 10.1.  Platelet count 311,000.  His BUN is 17 creatinine 0.86.  Blood sugar 167.  Calcium 8.4 with an albumin of 2.4.  His vital signs are temperature of 97.5.  Pulse 87.  Blood pressure 106/68.  His lungs sound clear bilaterally.  There is no maybe a little bit of wheezing on the left side.  He does have good air movement.  Cardiac exam regular rate and rhythm.  Abdomen is soft.  Bowel sounds are present.  There is no guarding or rebound tenderness.  Extremity shows no clubbing, cyanosis or edema.  Hopefully, we are looking at pneumonitis secondary to the [facility].  All cultures are negative.  Maybe we can get some of the antibiotics off.  I will switch him over to oral steroids.  I am just glad to see that he is improving slowly but surely.  We will get have to decide as to what kind of chemotherapy he might be able to tolerate.  I know he had problems in the past.  I do appreciate the outstanding care he is getting from all staff on 4 W.  [name], MD  2 [place] 2:3-4

## 2020-12-26 NOTE — Progress Notes (Signed)
PROGRESS NOTE    [name]  HCW:[id number] DOB: 1960/07/03 DOA: 12/22/2020 PCP: [name], MD    Brief Narrative:  John [name] is a 60 year old male with past medical history significant for stage 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 who just completed 11th cycle of Keytruda, paroxysmal atrial fibrillation who presented to [facility] ED on 11/7 with progressive shortness of breath x1 week.  Associated with increased productive cough with white sputum and sweats/malaise/fatigue.  Recently seen in outpatient pulmonology office and prescribed Augmentin and prednisone with supple oxygen.  Patient's shortness of breath progressed on 6 L nasal cannula which prompted evaluation in the ED.  In the ED, patient requiring 15 L NRB, chest x-ray with diffuse bilateral heterogeneous opacities.  CTA chest without PE but findings consistent with progressive pneumonitis.  Lactic acid 2.0.  WBC 1.9.  ABG with pH 7.39, PCO2 46, PO2 26.  Patient was given IV Solu-Medrol, vancomycin, cefepime, azithromycin, Xopenex nebs.  Hospitalist service consulted for further evaluation and management of acute hypoxic respiratory failure secondary to commune acquired pneumonia versus drug-induced pneumonitis.   Assessment & Plan:   Principal Problem:   Drug-induced pneumonitis Active Problems:   COPD with asthma ([place])   Stage 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 (HCC)   Hyponatremia   PAF (paroxysmal atrial fibrillation) (HCC)   Severe sepsis (HCC)   Acute on chronic respiratory failure with hypoxia (HCC)   Leukocytopenia   Acute hypoxic respite failure, POA Severe sepsis, POA Drug-induced pneumonitis vs community-acquired pneumonia Patient presenting to the ED with progressive shortness of breath despite outpatient treatment by pulmonology with Augmentin, prednisone and started on supplemental oxygen; which he is not on oxygen at baseline.  Chest x-ray with diffuse bilateral heterogeneous airspace  opacities and CT angiogram chest negative for PE with worsening aeration of lungs bilaterally concerning for progressively worsening pneumonitis likely secondary to immunotherapy treatment with Keytruda.  Required 15 L NRB on ED presentation.  Met criteria on admission with leukopenia, tachycardia, tachycardia and hypotension that was responsive to IV fluid hydration.  PCCM signed off 11/10, outpatient follow-up 2 weeks --Oncology following, appreciate assistance --Solu-Medrol transitioned to prednisone 80 mg p.o. PO daily on 11/11 --Anoro Ellipta 1 puff daily --Xopenex nebs q6h while awake and PRN --Continue supplemental oxygen, maintain SPO2 greater than 88%, on 4 L [place]   Leukopenia Etiology likely secondary to chemotherapy. --Continue prophylactic antiviral/antifungal with itraconazole and acyclovir --CBC daily  Hyponatremia Sodium 129 on admission, likely secondary to poor oral intake.  Improved. --Na 129>>132>134 --BMP daily  Paroxysmal atrial fibrillation Holding home metoprolol succinate 76m qHS due to borderline hypotension. --Amiodarone 2035mPO daily --Eliquis 2.5 mg PO BID (on xarelto at home but changed to Eliquis due to compatibility with itraconazole)  Stage IV squamous cell carcinoma right lung --Further per oncology.  COPD --Started on Anoro during hospitalization.  Pulmonology recommends LAMA/LABA on discharge (Stioloto vs Anoro vs Bevespri) --continue oxygen as above  HLD: Atorvastatin 10 mg p.o. daily  Depression/anxiety: Paxil 10 mg p.o. daily  GERD: Continue PPI   DVT prophylaxis: apixaban (ELIQUIS) tablet 2.5 mg Start: 12/23/20 2200 apixaban (ELIQUIS) tablet 2.5 mg    Code Status: Full Code Family Communication: No family present at bedside this morning  Disposition Plan:  Level of care: Med-Surg Status is: Inpatient  Remains inpatient appropriate because: Continue to wean oxygen, transition to oral steroids today, need to ensure respiratory  stability prior to discharge home.    Consultants:  PCCM, Dr. ClCarlis Abbott  signed off 11/10 Medical oncology, [name]  Procedures:  None  Antimicrobials:  Azithromycin 11/7 - 11/10 Cefepime 11/7 - 11/10 Itraconazole 11/8 - 11/10 Acyclovir 11/8>> Vancomycin 11/7 -11/7    Subjective: Patient seen examined at bedside, resting comfortably in bedside chair.  Currently receiving neb treatment by RT.  Oxygen now titrated down to 4 L nasal cannula.  Seen by oncology this morning, [name].  Completed course of antibiotics with azithromycin/cefepime.  Solu-Medrol now transition to oral prednisone today.  No specific questions or concerns at this time.  Denies headache, no visual changes, no fever/chills/night sweats, no nausea/vomiting/diarrhea, no chest pain, no palpitations, no abdominal pain, no weakness, no fatigue, no paresthesias.  No acute events overnight per nursing staff.  Objective: Vitals:   12/25/20 2300 12/26/20 0412 12/26/20 0835 12/26/20 1129  BP: 110/62 106/68    Pulse: 84 87    Resp: 19 18    Temp: 97.7 F (36.5 C) (!) 97.5 F (36.4 C)    TempSrc: Oral Oral    SpO2: 93% 92% 96% 97%  Weight:      Height:        Intake/Output Summary (Last 24 hours) at 12/26/2020 1210 Last data filed at 12/26/2020 0735 Gross per 24 hour  Intake --  Output 200 ml  Net -200 ml   Filed Weights   12/22/20 0930 12/23/20 0400 12/25/20 1719  Weight: 93 kg 91.1 kg 93.9 kg    Examination:  General exam: Appears calm and comfortable  Respiratory system: Mild late expiratory wheezing bilateral lung fields, no crackles, normal respiratory effort without accessory muscle use, on 4 L nasal cannula Cardiovascular system: S1 & S2 heard, RRR. No JVD, murmurs, rubs, gallops or clicks. No pedal edema. Gastrointestinal system: Abdomen is nondistended, soft and nontender. No organomegaly or masses felt. Normal bowel sounds heard. Central nervous system: Alert and oriented. No focal  neurological deficits. Extremities: Symmetric 5 x 5 power. Skin: No rashes, lesions or ulcers Psychiatry: Judgement and insight appear normal. Mood & affect appropriate.     Data Reviewed: I have personally reviewed following labs and imaging studies  CBC: Recent Labs  Lab 12/22/20 1000 12/22/20 1035 12/23/20 0239 12/24/20 0236 12/25/20 0235 12/26/20 0341  WBC 1.9*  --  1.5* 1.8* 1.9* 2.2*  NEUTROABS 0.9*  --   --  1.3* 1.4* 1.5*  HGB 12.7* 12.6* 10.3* 9.7* 9.8* 10.1*  HCT 37.2* 37.0* 30.6* 29.1* 30.0* 30.2*  MCV 84.5  --  85.2 85.6 87.2 85.8  PLT 294  --  260 307 314 161   Basic Metabolic Panel: Recent Labs  Lab 12/22/20 1003 12/22/20 1035 12/23/20 0239 12/24/20 0236 12/25/20 0235 12/26/20 0341  NA 129* 128* 131* 132* 134* 134*  K 4.1 4.7 4.3 3.7 3.9 4.1  CL 91* 92* 98 102 103 102  CO2 27  --  '25 22 25 27  ' GLUCOSE 112* 106* 168* 199* 183* 167*  BUN '16 19 20 19 14 17  ' CREATININE 1.07 1.20 0.97 0.77 0.78 0.86  CALCIUM 8.7*  --  8.3* 8.0* 8.3* 8.4*   GFR: Estimated Creatinine Clearance: 108.2 mL/min (by C-G formula based on SCr of 0.86 mg/dL). Liver Function Tests: Recent Labs  Lab 12/22/20 1003 12/23/20 0239 12/24/20 0236 12/25/20 0235 12/26/20 0341  AST 33 '27 28 27 ' 35  ALT '18 15 18 26 ' 46*  ALKPHOS 68 58 49 46 48  BILITOT 1.0 0.7 0.5 0.5 0.4  PROT 7.0 6.1* 5.7* 5.6* 5.6*  ALBUMIN  2.5* 2.3* 2.3* 2.3* 2.4*   No results for input(s): LIPASE, AMYLASE in the last 168 hours. No results for input(s): AMMONIA in the last 168 hours. Coagulation Profile: Recent Labs  Lab 12/22/20 1003  INR 2.2*   Cardiac Enzymes: No results for input(s): CKTOTAL, CKMB, CKMBINDEX, TROPONINI in the last 168 hours. BNP (last 3 results) No results for input(s): PROBNP in the last 8760 hours. HbA1C: No results for input(s): HGBA1C in the last 72 hours. CBG: No results for input(s): GLUCAP in the last 168 hours. Lipid Profile: No results for input(s): CHOL, HDL, LDLCALC,  TRIG, CHOLHDL, LDLDIRECT in the last 72 hours. Thyroid Function Tests: No results for input(s): TSH, T4TOTAL, FREET4, T3FREE, THYROIDAB in the last 72 hours. Anemia Panel: No results for input(s): VITAMINB12, FOLATE, FERRITIN, TIBC, IRON, RETICCTPCT in the last 72 hours. Sepsis Labs: Recent Labs  Lab 12/22/20 1003 12/22/20 1234 12/23/20 0239 12/24/20 0236  PROCALCITON  --  0.22 0.39 0.20  LATICACIDVEN 2.0* 1.3  --   --     Recent Results (from the past 240 hour(s))  Blood Culture (routine x 2)     Status: None (Preliminary result)   Collection Time: 12/22/20 10:03 AM   Specimen: BLOOD  Result Value Ref Range Status   Specimen Description   Final    BLOOD BLOOD RIGHT FOREARM Performed at [facility] [address], [place], [place] [address]    Special Requests   Final    BOTTLES DRAWN AEROBIC ONLY Blood Culture adequate volume Performed at [facility] [address], [place], [place] [address]    Culture   Final    NO GROWTH 4 DAYS Performed at [facility], [place] [address], [name], [place] [address]    Report Status PENDING  Incomplete  Blood Culture (routine x 2)     Status: None (Preliminary result)   Collection Time: 12/22/20 10:08 AM   Specimen: BLOOD  Result Value Ref Range Status   Specimen Description   Final    BLOOD LEFT ANTECUBITAL Performed at [facility] [address] [place], [place] [address]    Special Requests   Final    BOTTLES DRAWN AEROBIC AND ANAEROBIC Blood Culture adequate volume Performed at [facility] [address] [place], [place] [address]    Culture   Final    NO GROWTH 4 DAYS Performed at [facility], [place] [address] [place], [place] [address]    Report Status PENDING  Incomplete  MRSA Next Gen by PCR, Nasal     Status: None   Collection Time: 12/22/20 10:08 AM   Specimen: Nasopharyngeal Swab; Nasal Swab  Result Value Ref Range Status    MRSA by PCR Next Gen NOT DETECTED NOT DETECTED Final    Comment: (NOTE) The GeneXpert MRSA Assay (FDA approved for NASAL specimens only), is one component of a comprehensive MRSA colonization surveillance program. It is not intended to diagnose MRSA infection nor to guide or monitor treatment for MRSA infections. Test performance is not FDA approved in patients less than 36 years old. Performed at [facility], [place] [address], [place], [place] [address]   Resp Panel by RT-PCR (Flu A&B, Covid) Nasopharyngeal Swab     Status: None   Collection Time: 12/22/20 10:27 AM   Specimen: Nasopharyngeal Swab; Nasopharyngeal(NP) swabs in vial transport medium  Result Value Ref Range Status   SARS Coronavirus 2 by RT PCR NEGATIVE  NEGATIVE Final    Comment: (NOTE) SARS-CoV-2 target nucleic acids are NOT DETECTED.  The SARS-CoV-2 RNA is generally detectable in upper respiratory specimens during the acute phase of infection. The lowest concentration of SARS-CoV-2 viral copies this assay can detect is 138 copies/mL. A negative result does not preclude SARS-Cov-2 infection and should not be used as the sole basis for treatment or other patient management decisions. A negative result may occur with  improper specimen collection/handling, submission of specimen other than nasopharyngeal swab, presence of viral mutation(s) within the areas targeted by this assay, and inadequate number of viral copies(<138 copies/mL). A negative result must be combined with clinical observations, patient history, and epidemiological information. The expected result is Negative.  Fact Sheet for Patients:  EntrepreneurPulse.com.au  Fact Sheet for Healthcare Providers:  IncredibleEmployment.be  This test is no t yet approved or cleared by the [place] FDA and  has been authorized for detection and/or diagnosis of SARS-CoV-2 by FDA under an Emergency Use  Authorization (EUA). This EUA will remain  in effect (meaning this test can be used) for the duration of the COVID-19 declaration under Section 564(b)(1) of the Act, 21 U.S.C.section 360bbb-3(b)(1), unless the authorization is terminated  or revoked sooner.       Influenza A by PCR NEGATIVE NEGATIVE Final   Influenza B by PCR NEGATIVE NEGATIVE Final    Comment: (NOTE) The Xpert Xpress SARS-CoV-2/FLU/RSV plus assay is intended as an aid in the diagnosis of influenza from Nasopharyngeal swab specimens and should not be used as a sole basis for treatment. Nasal washings and aspirates are unacceptable for Xpert Xpress SARS-CoV-2/FLU/RSV testing.  Fact Sheet for Patients: EntrepreneurPulse.com.au  Fact Sheet for Healthcare Providers: IncredibleEmployment.be  This test is not yet approved or cleared by the [place] FDA and has been authorized for detection and/or diagnosis of SARS-CoV-2 by FDA under an Emergency Use Authorization (EUA). This EUA will remain in effect (meaning this test can be used) for the duration of the COVID-19 declaration under Section 564(b)(1) of the Act, 21 U.S.C. section 360bbb-3(b)(1), unless the authorization is terminated or revoked.  Performed at [facility], [place] [address], [place], [place] [address]   Urine Culture     Status: None   Collection Time: 12/22/20 12:32 PM   Specimen: In/Out Cath Urine  Result Value Ref Range Status   Specimen Description   Final    IN/OUT CATH URINE Performed at [facility], [place] [address] [place], [place] [address]    Special Requests   Final    NONE Performed at [facility], [place] [address] [place], [place] [address]    Culture   Final    NO GROWTH Performed at [facility], [place] [address], [place], [place] [address]    Report Status 12/23/2020 FINAL  Final  Respiratory (~20 pathogens) panel by PCR      Status: None   Collection Time: 12/22/20  1:47 PM   Specimen: Nasopharyngeal Swab; Respiratory  Result Value Ref Range Status   Adenovirus NOT DETECTED NOT DETECTED Final   Coronavirus 229E NOT DETECTED NOT DETECTED Final    Comment: (NOTE) The Coronavirus on the Respiratory Panel, DOES NOT test for the novel  Coronavirus (2019 nCoV)    Coronavirus HKU1 NOT DETECTED NOT DETECTED Final   Coronavirus NL63 NOT DETECTED NOT DETECTED Final   Coronavirus OC43 NOT DETECTED NOT DETECTED Final   Metapneumovirus NOT DETECTED NOT DETECTED Final   Rhinovirus /  Enterovirus NOT DETECTED NOT DETECTED Final   Influenza A NOT DETECTED NOT DETECTED Final   Influenza B NOT DETECTED NOT DETECTED Final   Parainfluenza Virus 1 NOT DETECTED NOT DETECTED Final   Parainfluenza Virus 2 NOT DETECTED NOT DETECTED Final   Parainfluenza Virus 3 NOT DETECTED NOT DETECTED Final   Parainfluenza Virus 4 NOT DETECTED NOT DETECTED Final   Respiratory Syncytial Virus NOT DETECTED NOT DETECTED Final   Bordetella pertussis NOT DETECTED NOT DETECTED Final   Bordetella Parapertussis NOT DETECTED NOT DETECTED Final   Chlamydophila pneumoniae NOT DETECTED NOT DETECTED Final   Mycoplasma pneumoniae NOT DETECTED NOT DETECTED Final    Comment: Performed at [facility], [place] [address] [place], [place] [address]         Radiology Studies: DG CHEST PORT 1 VIEW  Result Date: 12/26/2020 CLINICAL DATA:  Small cell lung cancer, possible pneumonitis EXAM: PORTABLE CHEST 1 VIEW COMPARISON:  Prior chest x-ray 12/24/2020 FINDINGS: Stable cardiac and mediastinal contours. No significant interval change in the appearance of the chest. Extensive interstitial opacities bilaterally more general throughout the left lung and more focal on the right in the mid and upper lung. No new airspace infiltrate, pleural effusion or pneumothorax. Bullous changes in the right mid lung again noted. IMPRESSION: Stable appearance of the chest. No  significant interval change compared to 12/24/2020. Persistent extensive interstitial airspace opacities throughout the left lung and primarily in the right upper lung. Electronically Signed   By: Jacqulynn Cadet M.D.   On: 12/26/2020 06:31        Scheduled Meds:  acyclovir  800 mg Oral BID   amiodarone  200 mg Oral QPM   apixaban  2.5 mg Oral BID   ipratropium  0.5 mg Nebulization TID   levalbuterol  0.63 mg Nebulization TID   mouth rinse  15 mL Mouth Rinse BID   pantoprazole  40 mg Oral QHS   PARoxetine  10 mg Oral Daily   predniSONE  80 mg Oral Q breakfast   umeclidinium-vilanterol  1 puff Inhalation Daily   Continuous Infusions:     LOS: 3 days    Time spent: 37 minutes spent on chart review, discussion with nursing staff, consultants, updating family and interview/physical exam; more than 50% of that time was spent in counseling and/or coordination of care.    Alison Kubicki J [place] DO [facility] Available via Epic secure chat 7am-7pm After these hours, please refer to coverage provider listed on [email or web address] 12/26/2020, 12:10 PM

## 2020-12-26 NOTE — Plan of Care (Signed)
  Problem: Education: Goal: Knowledge of General Education information will improve Description: Including pain rating scale, medication(s)/side effects and non-pharmacologic comfort measures Outcome: Progressing   Problem: Activity: Goal: Risk for activity intolerance will decrease Outcome: Progressing   Problem: Coping: Goal: Level of anxiety will decrease Outcome: Progressing   Problem: Elimination: Goal: Will not experience complications related to bowel motility Outcome: Progressing Goal: Will not experience complications related to urinary retention Outcome: Progressing   Problem: Pain Managment: Goal: General experience of comfort will improve Outcome: Progressing   Problem: Safety: Goal: Ability to remain free from injury will improve Outcome: Progressing   Problem: Skin Integrity: Goal: Risk for impaired skin integrity will decrease Outcome: Progressing   Problem: Activity: Goal: Ability to tolerate increased activity will improve Outcome: Progressing   Problem: Respiratory: Goal: Ability to maintain a clear airway will improve Outcome: Progressing Goal: Ability to maintain adequate ventilation will improve Outcome: Progressing

## 2020-12-27 ENCOUNTER — Other Ambulatory Visit: Payer: Self-pay

## 2020-12-27 DIAGNOSIS — J189 Pneumonia, unspecified organism: Secondary | ICD-10-CM | POA: Diagnosis not present

## 2020-12-27 DIAGNOSIS — T50905A Adverse effect of unspecified drugs, medicaments and biological substances, initial encounter: Secondary | ICD-10-CM | POA: Diagnosis not present

## 2020-12-27 LAB — COMPREHENSIVE METABOLIC PANEL
ALT: 53 U/L — ABNORMAL HIGH (ref 0–44)
AST: 31 U/L (ref 15–41)
Albumin: 2.3 g/dL — ABNORMAL LOW (ref 3.5–5.0)
Alkaline Phosphatase: 46 U/L (ref 38–126)
Anion gap: 3 — ABNORMAL LOW (ref 5–15)
BUN: 18 mg/dL (ref 6–20)
CO2: 27 mmol/L (ref 22–32)
Calcium: 8.3 mg/dL — ABNORMAL LOW (ref 8.9–10.3)
Chloride: 104 mmol/L (ref 98–111)
Creatinine, Ser: 0.66 mg/dL (ref 0.61–1.24)
GFR, Estimated: >60 mL/min (ref >60)
Glucose, Bld: 135 mg/dL — ABNORMAL HIGH (ref 70–99)
Potassium: 3.7 mmol/L (ref 3.5–5.1)
Sodium: 134 mmol/L — ABNORMAL LOW (ref 135–145)
Total Bilirubin: 0.5 mg/dL (ref 0.3–1.2)
Total Protein: 5.2 g/dL — ABNORMAL LOW (ref 6.5–8.1)

## 2020-12-27 LAB — CBC WITH DIFFERENTIAL/PLATELET
Abs Immature Granulocytes: 0.01 10*3/uL (ref 0.00–0.07)
Basophils Absolute: 0 10*3/uL (ref 0.0–0.1)
Basophils Relative: 0 %
Eosinophils Absolute: 0 10*3/uL (ref 0.0–0.5)
Eosinophils Relative: 0 %
HCT: 30.6 % — ABNORMAL LOW (ref 39.0–52.0)
Hemoglobin: 10.4 g/dL — ABNORMAL LOW (ref 13.0–17.0)
Immature Granulocytes: 0 %
Lymphocytes Relative: 14 %
Lymphs Abs: 0.3 10*3/uL — ABNORMAL LOW (ref 0.7–4.0)
MCH: 28.8 pg (ref 26.0–34.0)
MCHC: 34 g/dL (ref 30.0–36.0)
MCV: 84.8 fL (ref 80.0–100.0)
Monocytes Absolute: 0.4 10*3/uL (ref 0.1–1.0)
Monocytes Relative: 18 %
Neutro Abs: 1.6 10*3/uL — ABNORMAL LOW (ref 1.7–7.7)
Neutrophils Relative %: 68 %
Platelets: 294 10*3/uL (ref 150–400)
RBC: 3.61 MIL/uL — ABNORMAL LOW (ref 4.22–5.81)
RDW: 14.2 % (ref 11.5–15.5)
WBC: 2.4 10*3/uL — ABNORMAL LOW (ref 4.0–10.5)
nRBC: 0 % (ref 0.0–0.2)

## 2020-12-27 LAB — CULTURE, BLOOD (ROUTINE X 2)
Culture: NO GROWTH
Culture: NO GROWTH
Special Requests: ADEQUATE
Special Requests: ADEQUATE

## 2020-12-27 MED ORDER — METOPROLOL SUCCINATE ER 25 MG PO TB24
25.0000 mg | ORAL_TABLET | Freq: Every day | ORAL | Status: DC
  Administered 2020-12-27: 25 mg via ORAL
  Filled 2020-12-27: qty 1

## 2020-12-27 MED ORDER — DILTIAZEM HCL 25 MG/5ML IV SOLN
10.0000 mg | Freq: Once | INTRAVENOUS | Status: AC
  Administered 2020-12-27: 10 mg via INTRAVENOUS
  Filled 2020-12-27: qty 5

## 2020-12-27 NOTE — Progress Notes (Signed)
PROGRESS NOTE    John [name]  UUV:[id number] DOB: 08-Apr-1960 DOA: 12/22/2020 PCP: [name], MD    Brief Narrative:  John [name] is a 60 year old male with past medical history significant for stage 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 who just completed 11th cycle of Keytruda, paroxysmal atrial fibrillation who presented to [facility] ED on 11/7 with progressive shortness of breath x1 week.  Associated with increased productive cough with white sputum and sweats/malaise/fatigue.  Recently seen in outpatient pulmonology office and prescribed Augmentin and prednisone with supple oxygen.  Patient's shortness of breath progressed on 6 L nasal cannula which prompted evaluation in the ED.  In the ED, patient requiring 15 L NRB, chest x-ray with diffuse bilateral heterogeneous opacities.  CTA chest without PE but findings consistent with progressive pneumonitis.  Lactic acid 2.0.  WBC 1.9.  ABG with pH 7.39, PCO2 46, PO2 26.  Patient was given IV Solu-Medrol, vancomycin, cefepime, azithromycin, Xopenex nebs.  Hospitalist service consulted for further evaluation and management of acute hypoxic respiratory failure secondary to commune acquired pneumonia versus drug-induced pneumonitis.   Assessment & Plan:   Principal Problem:   Drug-induced pneumonitis Active Problems:   COPD with asthma ([place])   Stage 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 (HCC)   Hyponatremia   PAF (paroxysmal atrial fibrillation) (HCC)   Severe sepsis (HCC)   Acute on chronic respiratory failure with hypoxia (HCC)   Leukocytopenia   Acute hypoxic respite failure, POA Severe sepsis, POA Drug-induced pneumonitis vs community-acquired pneumonia Patient presenting to the ED with progressive shortness of breath despite outpatient treatment by pulmonology with Augmentin, prednisone and started on supplemental oxygen; which he is not on oxygen at baseline.  Chest x-ray with diffuse bilateral heterogeneous airspace  opacities and CT angiogram chest negative for PE with worsening aeration of lungs bilaterally concerning for progressively worsening pneumonitis likely secondary to immunotherapy treatment with Keytruda.  Required 15 L NRB on ED presentation.  Met criteria on admission with leukopenia, tachycardia, tachycardia and hypotension that was responsive to IV fluid hydration.  PCCM signed off 11/10, outpatient follow-up 2 weeks --Oncology following, appreciate assistance --Prednisone 80 mg p.o. daily; will need 6 week slow taper on discharge --Anoro Ellipta 1 puff daily --Xopenex nebs q6h while awake and PRN --Continue supplemental oxygen, maintain SPO2 greater than 88%, on 3 L [place]  --Ambulatory O2 screen today  Leukopenia Etiology likely secondary to chemotherapy. --Continue prophylactic antiviral/antifungal with itraconazole and acyclovir --CBC daily  Hyponatremia Sodium 129 on admission, likely secondary to poor oral intake.  Improved. --Na 129>>132>134  Paroxysmal atrial fibrillation Holding home metoprolol succinate 54m qHS due to borderline hypotension. --Amiodarone 2071mPO daily --Eliquis 2.5 mg PO BID (on xarelto at home but changed to Eliquis due to compatibility with itraconazole)  Stage IV squamous cell carcinoma right lung --Further per oncology.  COPD --Started on Anoro during hospitalization.  Pulmonology recommends LAMA/LABA on discharge (Stioloto vs Anoro vs Bevespri) --continue oxygen as above  HLD: Atorvastatin 10 mg p.o. daily  Depression/anxiety: Paxil 10 mg p.o. daily  GERD: Continue PPI   DVT prophylaxis: apixaban (ELIQUIS) tablet 2.5 mg Start: 12/23/20 2200 apixaban (ELIQUIS) tablet 2.5 mg    Code Status: Full Code Family Communication: No family present at bedside this morning  Disposition Plan:  Level of care: Med-Surg Status is: Inpatient  Remains inpatient appropriate because: Continue to wean oxygen, transitioned to oral steroids, need to ensure  respiratory stability prior to discharge home.    Consultants:  PCCM, Dr. Carlis Abbott - signed off 11/10 Medical oncology, [name]  Procedures:  None  Antimicrobials:  Azithromycin 11/7 - 11/10 Cefepime 11/7 - 11/10 Itraconazole 11/8 - 11/10 Acyclovir 11/8>> Vancomycin 11/7 -11/7    Subjective: Patient seen examined at bedside, resting comfortably in bed.  Breathing continues to improve daily.  Oxygen now down to 3 L nasal cannula at rest.  IV steroids now transition to oral prednisone.  We will attempt amatory O2 screening today. No specific questions or concerns at this time.  Denies headache, no visual changes, no fever/chills/night sweats, no nausea/vomiting/diarrhea, no chest pain, no palpitations, no abdominal pain, no weakness, no fatigue, no paresthesias.  No acute events overnight per nursing staff.  Objective: Vitals:   12/26/20 2136 12/27/20 0503 12/27/20 0808 12/27/20 0809  BP: 116/71 112/78    Pulse: 88 80    Resp: 19 20    Temp: 97.9 F (36.6 C) 97.8 F (36.6 C)    TempSrc: Oral Oral    SpO2: 96% 96% 93% 93%  Weight:      Height:        Intake/Output Summary (Last 24 hours) at 12/27/2020 1120 Last data filed at 12/27/2020 0700 Gross per 24 hour  Intake 600 ml  Output 1000 ml  Net -400 ml   Filed Weights   12/22/20 0930 12/23/20 0400 12/25/20 1719  Weight: 93 kg 91.1 kg 93.9 kg    Examination:  General exam: Appears calm and comfortable  Respiratory system: Mild late expiratory wheezing bilateral lung fields, no crackles, normal respiratory effort without accessory muscle use, on 3 L nasal cannula Cardiovascular system: S1 & S2 heard, RRR. No JVD, murmurs, rubs, gallops or clicks. No pedal edema. Gastrointestinal system: Abdomen is nondistended, soft and nontender. No organomegaly or masses felt. Normal bowel sounds heard. Central nervous system: Alert and oriented. No focal neurological deficits. Extremities: Symmetric 5 x 5 power. Skin: No rashes,  lesions or ulcers Psychiatry: Judgement and insight appear normal. Mood & affect appropriate.     Data Reviewed: I have personally reviewed following labs and imaging studies  CBC: Recent Labs  Lab 12/22/20 1000 12/22/20 1035 12/23/20 0239 12/24/20 0236 12/25/20 0235 12/26/20 0341 12/27/20 0432  WBC 1.9*  --  1.5* 1.8* 1.9* 2.2* 2.4*  NEUTROABS 0.9*  --   --  1.3* 1.4* 1.5* 1.6*  HGB 12.7*   < > 10.3* 9.7* 9.8* 10.1* 10.4*  HCT 37.2*   < > 30.6* 29.1* 30.0* 30.2* 30.6*  MCV 84.5  --  85.2 85.6 87.2 85.8 84.8  PLT 294  --  260 307 314 311 294   < > = values in this interval not displayed.   Basic Metabolic Panel: Recent Labs  Lab 12/23/20 0239 12/24/20 0236 12/25/20 0235 12/26/20 0341 12/27/20 0432  NA 131* 132* 134* 134* 134*  K 4.3 3.7 3.9 4.1 3.7  CL 98 102 103 102 104  CO2 '25 22 25 27 27  ' GLUCOSE 168* 199* 183* 167* 135*  BUN '20 19 14 17 18  ' CREATININE 0.97 0.77 0.78 0.86 0.66  CALCIUM 8.3* 8.0* 8.3* 8.4* 8.3*   GFR: Estimated Creatinine Clearance: 116.3 mL/min (by C-G formula based on SCr of 0.66 mg/dL). Liver Function Tests: Recent Labs  Lab 12/23/20 0239 12/24/20 0236 12/25/20 0235 12/26/20 0341 12/27/20 0432  AST '27 28 27 ' 35 31  ALT '15 18 26 ' 46* 53*  ALKPHOS 58 49 46 48 46  BILITOT 0.7 0.5 0.5 0.4 0.5  PROT 6.1*  5.7* 5.6* 5.6* 5.2*  ALBUMIN 2.3* 2.3* 2.3* 2.4* 2.3*   No results for input(s): LIPASE, AMYLASE in the last 168 hours. No results for input(s): AMMONIA in the last 168 hours. Coagulation Profile: Recent Labs  Lab 12/22/20 1003  INR 2.2*   Cardiac Enzymes: No results for input(s): CKTOTAL, CKMB, CKMBINDEX, TROPONINI in the last 168 hours. BNP (last 3 results) No results for input(s): PROBNP in the last 8760 hours. HbA1C: No results for input(s): HGBA1C in the last 72 hours. CBG: No results for input(s): GLUCAP in the last 168 hours. Lipid Profile: No results for input(s): CHOL, HDL, LDLCALC, TRIG, CHOLHDL, LDLDIRECT in the  last 72 hours. Thyroid Function Tests: No results for input(s): TSH, T4TOTAL, FREET4, T3FREE, THYROIDAB in the last 72 hours. Anemia Panel: No results for input(s): VITAMINB12, FOLATE, FERRITIN, TIBC, IRON, RETICCTPCT in the last 72 hours. Sepsis Labs: Recent Labs  Lab 12/22/20 1003 12/22/20 1234 12/23/20 0239 12/24/20 0236  PROCALCITON  --  0.22 0.39 0.20  LATICACIDVEN 2.0* 1.3  --   --     Recent Results (from the past 240 hour(s))  Blood Culture (routine x 2)     Status: None   Collection Time: 12/22/20 10:03 AM   Specimen: BLOOD  Result Value Ref Range Status   Specimen Description   Final    BLOOD BLOOD RIGHT FOREARM Performed at [facility] [address], [place], [place] [address]    Special Requests   Final    BOTTLES DRAWN AEROBIC ONLY Blood Culture adequate volume Performed at [facility] [address], [place], [place] [address]    Culture   Final    NO GROWTH 5 DAYS Performed at [facility], [place] [address] [place], [place] [address]    Report Status 12/27/2020 FINAL  Final  Blood Culture (routine x 2)     Status: None   Collection Time: 12/22/20 10:08 AM   Specimen: BLOOD  Result Value Ref Range Status   Specimen Description   Final    BLOOD LEFT ANTECUBITAL Performed at [facility] [address], [place], [place] [address]    Special Requests   Final    BOTTLES DRAWN AEROBIC AND ANAEROBIC Blood Culture adequate volume Performed at [facility] [address], [place], [place] [address]    Culture   Final    NO GROWTH 5 DAYS Performed at [facility], [place] [employer] [address] [place], [place] [address]    Report Status 12/27/2020 FINAL  Final  MRSA Next Gen by PCR, Nasal     Status: None   Collection Time: 12/22/20 10:08 AM   Specimen: Nasopharyngeal Swab; Nasal Swab  Result Value Ref Range Status   MRSA by PCR Next Gen NOT DETECTED NOT DETECTED Final     Comment: (NOTE) The GeneXpert MRSA Assay (FDA approved for NASAL specimens only), is one component of a comprehensive MRSA colonization surveillance program. It is not intended to diagnose MRSA infection nor to guide or monitor treatment for MRSA infections. Test performance is not FDA approved in patients less than 63 years old. Performed at [facility], [place] [address], [place],  [address]   Resp Panel by RT-PCR (Flu A&B, Covid) Nasopharyngeal Swab     Status: None   Collection Time: 12/22/20 10:27 AM   Specimen: Nasopharyngeal Swab; Nasopharyngeal(NP) swabs in vial transport medium  Result Value Ref Range Status   SARS Coronavirus 2  by RT PCR NEGATIVE NEGATIVE Final    Comment: (NOTE) SARS-CoV-2 target nucleic acids are NOT DETECTED.  The SARS-CoV-2 RNA is generally detectable in upper respiratory specimens during the acute phase of infection. The lowest concentration of SARS-CoV-2 viral copies this assay can detect is 138 copies/mL. A negative result does not preclude SARS-Cov-2 infection and should not be used as the sole basis for treatment or other patient management decisions. A negative result may occur with  improper specimen collection/handling, submission of specimen other than nasopharyngeal swab, presence of viral mutation(s) within the areas targeted by this assay, and inadequate number of viral copies(<138 copies/mL). A negative result must be combined with clinical observations, patient history, and epidemiological information. The expected result is Negative.  Fact Sheet for Patients:  EntrepreneurPulse.com.au  Fact Sheet for Healthcare Providers:  IncredibleEmployment.be  This test is no t yet approved or cleared by the [place] FDA and  has been authorized for detection and/or diagnosis of SARS-CoV-2 by FDA under an Emergency Use Authorization (EUA). This EUA will remain  in effect (meaning  this test can be used) for the duration of the COVID-19 declaration under Section 564(b)(1) of the Act, 21 U.S.C.section 360bbb-3(b)(1), unless the authorization is terminated  or revoked sooner.       Influenza A by PCR NEGATIVE NEGATIVE Final   Influenza B by PCR NEGATIVE NEGATIVE Final    Comment: (NOTE) The Xpert Xpress SARS-CoV-2/FLU/RSV plus assay is intended as an aid in the diagnosis of influenza from Nasopharyngeal swab specimens and should not be used as a sole basis for treatment. Nasal washings and aspirates are unacceptable for Xpert Xpress SARS-CoV-2/FLU/RSV testing.  Fact Sheet for Patients: EntrepreneurPulse.com.au  Fact Sheet for Healthcare Providers: IncredibleEmployment.be  This test is not yet approved or cleared by the [place] FDA and has been authorized for detection and/or diagnosis of SARS-CoV-2 by FDA under an Emergency Use Authorization (EUA). This EUA will remain in effect (meaning this test can be used) for the duration of the COVID-19 declaration under Section 564(b)(1) of the Act, 21 U.S.C. section 360bbb-3(b)(1), unless the authorization is terminated or revoked.  Performed at [facility], [place] [address] [place], [place] [address]   Urine Culture     Status: None   Collection Time: 12/22/20 12:32 PM   Specimen: In/Out Cath Urine  Result Value Ref Range Status   Specimen Description   Final    IN/OUT CATH URINE Performed at [facility], [place] [address] [place], [place] [address]    Special Requests   Final    NONE Performed at [facility], [place] [address] [place], [place] [address]    Culture   Final    NO GROWTH Performed at [facility], [place] [address] [place], [place] [address]    Report Status 12/23/2020 FINAL  Final  Respiratory (~20 pathogens) panel by PCR     Status: None   Collection Time: 12/22/20  1:47 PM   Specimen:  Nasopharyngeal Swab; Respiratory  Result Value Ref Range Status   Adenovirus NOT DETECTED NOT DETECTED Final   Coronavirus 229E NOT DETECTED NOT DETECTED Final    Comment: (NOTE) The Coronavirus on the Respiratory Panel, DOES NOT test for the novel  Coronavirus (2019 nCoV)    Coronavirus HKU1 NOT DETECTED NOT DETECTED Final   Coronavirus NL63 NOT DETECTED NOT DETECTED Final   Coronavirus OC43 NOT DETECTED NOT DETECTED Final   Metapneumovirus NOT DETECTED NOT DETECTED  Final   Rhinovirus / Enterovirus NOT DETECTED NOT DETECTED Final   Influenza A NOT DETECTED NOT DETECTED Final   Influenza B NOT DETECTED NOT DETECTED Final   Parainfluenza Virus 1 NOT DETECTED NOT DETECTED Final   Parainfluenza Virus 2 NOT DETECTED NOT DETECTED Final   Parainfluenza Virus 3 NOT DETECTED NOT DETECTED Final   Parainfluenza Virus 4 NOT DETECTED NOT DETECTED Final   Respiratory Syncytial Virus NOT DETECTED NOT DETECTED Final   Bordetella pertussis NOT DETECTED NOT DETECTED Final   Bordetella Parapertussis NOT DETECTED NOT DETECTED Final   Chlamydophila pneumoniae NOT DETECTED NOT DETECTED Final   Mycoplasma pneumoniae NOT DETECTED NOT DETECTED Final    Comment: Performed at [facility], [place] [address] [place], [place] [address]         Radiology Studies: DG CHEST PORT 1 VIEW  Result Date: 12/26/2020 CLINICAL DATA:  Small cell lung cancer, possible pneumonitis EXAM: PORTABLE CHEST 1 VIEW COMPARISON:  Prior chest x-ray 12/24/2020 FINDINGS: Stable cardiac and mediastinal contours. No significant interval change in the appearance of the chest. Extensive interstitial opacities bilaterally more general throughout the left lung and more focal on the right in the mid and upper lung. No new airspace infiltrate, pleural effusion or pneumothorax. Bullous changes in the right mid lung again noted. IMPRESSION: Stable appearance of the chest. No significant interval change compared to 12/24/2020. Persistent  extensive interstitial airspace opacities throughout the left lung and primarily in the right upper lung. Electronically Signed   By: Jacqulynn Cadet M.D.   On: 12/26/2020 06:31        Scheduled Meds:  acyclovir  800 mg Oral BID   amiodarone  200 mg Oral QPM   apixaban  2.5 mg Oral BID   ipratropium  0.5 mg Nebulization TID   levalbuterol  0.63 mg Nebulization TID   mouth rinse  15 mL Mouth Rinse BID   pantoprazole  40 mg Oral QHS   PARoxetine  10 mg Oral Daily   predniSONE  80 mg Oral Q breakfast   umeclidinium-vilanterol  1 puff Inhalation Daily   Continuous Infusions:     LOS: 4 days    Time spent: 37 minutes spent on chart review, discussion with nursing staff, consultants, updating family and interview/physical exam; more than 50% of that time was spent in counseling and/or coordination of care.    John Canelo J [place] DO [facility] Available via Epic secure chat 7am-7pm After these hours, please refer to coverage provider listed on [email or web address] 12/27/2020, 11:20 AM

## 2020-12-27 NOTE — Plan of Care (Signed)
  Problem: Education: Goal: Knowledge of General Education information will improve Description: Including pain rating scale, medication(s)/side effects and non-pharmacologic comfort measures Outcome: Progressing   Problem: Health Behavior/Discharge Planning: Goal: Ability to manage health-related needs will improve Outcome: Progressing   Problem: Clinical Measurements: Goal: Ability to maintain clinical measurements within normal limits will improve Outcome: Progressing Goal: Will remain free from infection Outcome: Progressing Goal: Diagnostic test results will improve Outcome: Progressing Goal: Respiratory complications will improve Outcome: Progressing Goal: Cardiovascular complication will be avoided Outcome: Progressing   Problem: Activity: Goal: Risk for activity intolerance will decrease Outcome: Progressing   Problem: Nutrition: Goal: Adequate nutrition will be maintained Outcome: Progressing   Problem: Coping: Goal: Level of anxiety will decrease Outcome: Progressing   Problem: Elimination: Goal: Will not experience complications related to bowel motility Outcome: Progressing Goal: Will not experience complications related to urinary retention Outcome: Progressing   Problem: Pain Managment: Goal: General experience of comfort will improve Outcome: Progressing   Problem: Safety: Goal: Ability to remain free from injury will improve Outcome: Progressing   Problem: Activity: Goal: Ability to tolerate increased activity will improve Outcome: Progressing Goal: Will verbalize the importance of balancing activity with adequate rest periods Outcome: Progressing   Problem: Skin Integrity: Goal: Risk for impaired skin integrity will decrease Outcome: Progressing   Problem: Respiratory: Goal: Ability to maintain a clear airway will improve Outcome: Progressing Goal: Levels of oxygenation will improve Outcome: Progressing Goal: Ability to maintain adequate  ventilation will improve Outcome: Progressing

## 2020-12-28 DIAGNOSIS — T50905A Adverse effect of unspecified drugs, medicaments and biological substances, initial encounter: Secondary | ICD-10-CM | POA: Diagnosis not present

## 2020-12-28 DIAGNOSIS — J189 Pneumonia, unspecified organism: Secondary | ICD-10-CM | POA: Diagnosis not present

## 2020-12-28 MED ORDER — METOPROLOL SUCCINATE ER 50 MG PO TB24
50.0000 mg | ORAL_TABLET | Freq: Every day | ORAL | Status: DC
  Administered 2020-12-28: 50 mg via ORAL
  Filled 2020-12-28: qty 1

## 2020-12-28 NOTE — Plan of Care (Signed)
  Problem: Education: Goal: Knowledge of General Education information will improve Description: Including pain rating scale, medication(s)/side effects and non-pharmacologic comfort measures Outcome: Progressing   Problem: Health Behavior/Discharge Planning: Goal: Ability to manage health-related needs will improve Outcome: Progressing   Problem: Clinical Measurements: Goal: Ability to maintain clinical measurements within normal limits will improve Outcome: Progressing Goal: Will remain free from infection Outcome: Progressing Goal: Diagnostic test results will improve Outcome: Progressing Goal: Respiratory complications will improve Outcome: Progressing Goal: Cardiovascular complication will be avoided Outcome: Progressing   Problem: Activity: Goal: Risk for activity intolerance will decrease Outcome: Progressing   Problem: Nutrition: Goal: Adequate nutrition will be maintained Outcome: Progressing   Problem: Coping: Goal: Level of anxiety will decrease Outcome: Progressing   Problem: Elimination: Goal: Will not experience complications related to bowel motility Outcome: Progressing Goal: Will not experience complications related to urinary retention Outcome: Progressing   Problem: Pain Managment: Goal: General experience of comfort will improve Outcome: Progressing   Problem: Safety: Goal: Ability to remain free from injury will improve Outcome: Progressing   Problem: Skin Integrity: Goal: Risk for impaired skin integrity will decrease Outcome: Progressing   Problem: Activity: Goal: Ability to tolerate increased activity will improve Outcome: Progressing Goal: Will verbalize the importance of balancing activity with adequate rest periods Outcome: Progressing   Problem: Respiratory: Goal: Ability to maintain a clear airway will improve Outcome: Progressing Goal: Levels of oxygenation will improve Outcome: Progressing Goal: Ability to maintain adequate  ventilation will improve Outcome: Progressing

## 2020-12-28 NOTE — Progress Notes (Signed)
PROGRESS NOTE    John [name]  FAO:[id number] DOB: [birth year] DOA: 12/22/2020 PCP: [name], MD    Brief Narrative:  John [name] is a 60 year old male with past medical history significant for stage 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 who just completed 11th cycle of Keytruda, paroxysmal atrial fibrillation who presented to [place] ED on 11/7 with progressive shortness of breath x1 week.  Associated with increased productive cough with white sputum and sweats/malaise/fatigue.  Recently seen in outpatient pulmonology office and prescribed Augmentin and prednisone with supple oxygen.  Patient's shortness of breath progressed on 6 L nasal cannula which prompted evaluation in the ED.  In the ED, patient requiring 15 L NRB, chest x-ray with diffuse bilateral heterogeneous opacities.  CTA chest without PE but findings consistent with progressive pneumonitis.  Lactic acid 2.0.  WBC 1.9.  ABG with pH 7.39, PCO2 46, PO2 26.  Patient was given IV Solu-Medrol, vancomycin, cefepime, azithromycin, Xopenex nebs.  Hospitalist service consulted for further evaluation and management of acute hypoxic respiratory failure secondary to commune acquired pneumonia versus drug-induced pneumonitis.   Assessment & Plan:   Principal Problem:   Drug-induced pneumonitis Active Problems:   COPD with asthma ([place])   Stage 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 (HCC)   Hyponatremia   PAF (paroxysmal atrial fibrillation) (HCC)   Severe sepsis (HCC)   Acute on chronic respiratory failure with hypoxia (HCC)   Leukocytopenia   Acute hypoxic respiratory failure, POA Severe sepsis, POA Drug-induced pneumonitis vs community-acquired pneumonia Patient presenting to the ED with progressive shortness of breath despite outpatient treatment by pulmonology with Augmentin, prednisone and started on supplemental oxygen; which he is not on oxygen at baseline.  Chest x-ray with diffuse bilateral heterogeneous airspace  opacities and CT angiogram chest negative for PE with worsening aeration of lungs bilaterally concerning for progressively worsening pneumonitis likely secondary to immunotherapy treatment with Keytruda.  Required 15 L NRB on ED presentation.  Met criteria on admission with leukopenia, tachycardia, tachycardia and hypotension that was responsive to IV fluid hydration.  PCCM signed off 11/10, outpatient follow-up 2 weeks --Oncology following, appreciate assistance --Prednisone 80 mg p.o. daily; will need 6 week slow taper on discharge --Anoro Ellipta 1 puff daily --Xopenex nebs q6h while awake and PRN --Continue supplemental oxygen, maintain SPO2 greater than 88%, on 3 L [place]   Leukopenia Etiology likely secondary to chemotherapy. --Continue prophylactic antiviral acyclovir --CBC daily  Hyponatremia Sodium 129 on admission, likely secondary to poor oral intake.  Improved. --Na 129>>132>134  Paroxysmal atrial fibrillation w/ RVR Follows with cardiology outpatient, Dr. Radford [name].  Has required TEE/DCCV on 10/13/2020. --Amiodarone 266m PO daily --Increased metoprolol succinate to 50 mg p.o. daily --Eliquis 2.5 mg PO BID (on xarelto at home but changed to Eliquis due to compatibility with itraconazole) --Continue monitor on telemetry  Stage IV squamous cell carcinoma right lung --Further per oncology.  COPD --Started on Anoro during hospitalization.  Pulmonology recommends LAMA/LABA on discharge (Stioloto vs Anoro vs Bevespri) --continue oxygen as above  HLD: Atorvastatin 10 mg p.o. daily  Depression/anxiety: Paxil 10 mg p.o. daily  GERD: Continue PPI   DVT prophylaxis: apixaban (ELIQUIS) tablet 2.5 mg Start: 12/23/20 2200 apixaban (ELIQUIS) tablet 2.5 mg    Code Status: Full Code Family Communication: No family present at bedside this morning  Disposition Plan:  Level of care: Med-Surg Status is: Inpatient  Remains inpatient appropriate because: Continue to wean oxygen,  transitioned to oral steroids, need to ensure respiratory stability  prior to discharge home; now with A. fib with RVR, increasing metoprolol succinate.    Consultants:  PCCM, Dr. Carlis Abbott - signed off 11/10 Medical oncology, [name]  Procedures:  None  Antimicrobials:  Azithromycin 11/7 - 11/10 Cefepime 11/7 - 11/10 Itraconazole 11/8 - 11/10 Acyclovir 11/8>> Vancomycin 11/7 -11/7    Subjective: Patient seen examined at bedside, resting comfortably in bed.  Attempted amatory O2 screen this morning with associated dizziness and A. fib with RVR.  Remains on 3 L nasal cannula at rest.  Patient continues to feel weak and fatigued.  Denies headache, no visual changes, no fever/chills/night sweats, no nausea/vomiting/diarrhea, no chest pain, no palpitations, no abdominal pain, no weakness, no fatigue, no paresthesias.  No acute events overnight per nursing staff.  Objective: Vitals:   12/28/20 0412 12/28/20 0753 12/28/20 0842 12/28/20 0909  BP: 101/77 101/72    Pulse: 85 86  (!) 144  Resp: 18 (!) 21    Temp: 97.9 F (36.6 C) 98.2 F (36.8 C)    TempSrc: Oral Oral    SpO2: 92% 93% 94%   Weight:      Height:        Intake/Output Summary (Last 24 hours) at 12/28/2020 1248 Last data filed at 12/28/2020 0006 Gross per 24 hour  Intake 240 ml  Output 975 ml  Net -735 ml   Filed Weights   12/22/20 0930 12/23/20 0400 12/25/20 1719  Weight: 93 kg 91.1 kg 93.9 kg    Examination:  General exam: Appears calm and comfortable  Respiratory system: Mild late expiratory wheezing bilateral lung fields, no crackles, normal respiratory effort without accessory muscle use, on 3 L nasal cannula with SPO2 92% at rest Cardiovascular system: S1 & S2 heard, irregularly irregular rhythm, tachycardic. No JVD, murmurs, rubs, gallops or clicks. No pedal edema. Gastrointestinal system: Abdomen is nondistended, soft and nontender. No organomegaly or masses felt. Normal bowel sounds heard. Central  nervous system: Alert and oriented. No focal neurological deficits. Extremities: Symmetric 5 x 5 power. Skin: No rashes, lesions or ulcers Psychiatry: Judgement and insight appear normal. Mood & affect appropriate.     Data Reviewed: I have personally reviewed following labs and imaging studies  CBC: Recent Labs  Lab 12/22/20 1000 12/22/20 1035 12/23/20 0239 12/24/20 0236 12/25/20 0235 12/26/20 0341 12/27/20 0432  WBC 1.9*  --  1.5* 1.8* 1.9* 2.2* 2.4*  NEUTROABS 0.9*  --   --  1.3* 1.4* 1.5* 1.6*  HGB 12.7*   < > 10.3* 9.7* 9.8* 10.1* 10.4*  HCT 37.2*   < > 30.6* 29.1* 30.0* 30.2* 30.6*  MCV 84.5  --  85.2 85.6 87.2 85.8 84.8  PLT 294  --  260 307 314 311 294   < > = values in this interval not displayed.   Basic Metabolic Panel: Recent Labs  Lab 12/23/20 0239 12/24/20 0236 12/25/20 0235 12/26/20 0341 12/27/20 0432  NA 131* 132* 134* 134* 134*  K 4.3 3.7 3.9 4.1 3.7  CL 98 102 103 102 104  CO2 _0 GLUCOSE 168* 199* 183* 167* 135*  BUN _1 CREATININE 0.97 0.77 0.78 0.86 0.66  CALCIUM 8.3* 8.0* 8.3* 8.4* 8.3*   GFR: Estimated Creatinine Clearance: 116.3 mL/min (by C-G formula based on SCr of 0.66 mg/dL). Liver Function Tests: Recent Labs  Lab 12/23/20 0239 12/24/20 0236 12/25/20 0235 12/26/20 0341 12/27/20 0432  AST _2 35 31  ALT 15 18 26  46* 53*  ALKPHOS 58 49 46 48 46  BILITOT 0.7 0.5 0.5 0.4 0.5  PROT 6.1* 5.7* 5.6* 5.6* 5.2*  ALBUMIN 2.3* 2.3* 2.3* 2.4* 2.3*   No results for input(s): LIPASE, AMYLASE in the last 168 hours. No results for input(s): AMMONIA in the last 168 hours. Coagulation Profile: Recent Labs  Lab 12/22/20 1003  INR 2.2*   Cardiac Enzymes: No results for input(s): CKTOTAL, CKMB, CKMBINDEX, TROPONINI in the last 168 hours. BNP (last 3 results) No results for input(s): PROBNP in the last 8760 hours. HbA1C: No results for input(s): HGBA1C in the last 72 hours. CBG: No results for input(s):  GLUCAP in the last 168 hours. Lipid Profile: No results for input(s): CHOL, HDL, LDLCALC, TRIG, CHOLHDL, LDLDIRECT in the last 72 hours. Thyroid Function Tests: No results for input(s): TSH, T4TOTAL, FREET4, T3FREE, THYROIDAB in the last 72 hours. Anemia Panel: No results for input(s): VITAMINB12, FOLATE, FERRITIN, TIBC, IRON, RETICCTPCT in the last 72 hours. Sepsis Labs: Recent Labs  Lab 12/22/20 1003 12/22/20 1234 12/23/20 0239 12/24/20 0236  PROCALCITON  --  0.22 0.39 0.20  LATICACIDVEN 2.0* 1.3  --   --     Recent Results (from the past 240 hour(s))  Blood Culture (routine x 2)     Status: None   Collection Time: 12/22/20 10:03 AM   Specimen: BLOOD  Result Value Ref Range Status   Specimen Description   Final    BLOOD BLOOD RIGHT FOREARM Performed at [facility] [address] [place], [place] [address]    Special Requests   Final    BOTTLES DRAWN AEROBIC ONLY Blood Culture adequate volume Performed at [facility] [address], [place], [place] [address]    Culture   Final    NO GROWTH 5 DAYS Performed at [facility], [place] [address], [place], [place] [address]    Report Status 12/27/2020 FINAL  Final  Blood Culture (routine x 2)     Status: None   Collection Time: 12/22/20 10:08 AM   Specimen: BLOOD  Result Value Ref Range Status   Specimen Description   Final    BLOOD LEFT ANTECUBITAL Performed at [facility] [address], [place], [place] [address]    Special Requests   Final    BOTTLES DRAWN AEROBIC AND ANAEROBIC Blood Culture adequate volume Performed at [facility] [address] [place], [place] [address]    Culture   Final    NO GROWTH 5 DAYS Performed at [facility], [place] [address] [place], [place] [address]    Report Status 12/27/2020 FINAL  Final  MRSA Next Gen by PCR, Nasal     Status: None   Collection Time: 12/22/20 10:08 AM   Specimen:  Nasopharyngeal Swab; Nasal Swab  Result Value Ref Range Status   MRSA by PCR Next Gen NOT DETECTED NOT DETECTED Final    Comment: (NOTE) The GeneXpert MRSA Assay (FDA approved for NASAL specimens only), is one component of a comprehensive MRSA colonization surveillance program. It is not intended to diagnose MRSA infection nor to guide or monitor treatment for MRSA infections. Test performance is not FDA approved in patients less than 48 years old. Performed at [facility], [place] [address] [place], [place] [address]   Resp Panel by RT-PCR (Flu A&B, Covid) Nasopharyngeal Swab     Status: None   Collection Time: 12/22/20 10:27 AM   Specimen:  Nasopharyngeal Swab; Nasopharyngeal(NP) swabs in vial transport medium  Result Value Ref Range Status   SARS Coronavirus 2 by RT PCR NEGATIVE NEGATIVE Final    Comment: (NOTE) SARS-CoV-2 target nucleic acids are NOT DETECTED.  The SARS-CoV-2 RNA is generally detectable in upper respiratory specimens during the acute phase of infection. The lowest concentration of SARS-CoV-2 viral copies this assay can detect is 138 copies/mL. A negative result does not preclude SARS-Cov-2 infection and should not be used as the sole basis for treatment or other patient management decisions. A negative result may occur with  improper specimen collection/handling, submission of specimen other than nasopharyngeal swab, presence of viral mutation(s) within the areas targeted by this assay, and inadequate number of viral copies(<138 copies/mL). A negative result must be combined with clinical observations, patient history, and epidemiological information. The expected result is Negative.  Fact Sheet for Patients:  EntrepreneurPulse.com.au  Fact Sheet for Healthcare Providers:  IncredibleEmployment.be  This test is no t yet approved or cleared by the [place] FDA and  has been authorized for detection  and/or diagnosis of SARS-CoV-2 by FDA under an Emergency Use Authorization (EUA). This EUA will remain  in effect (meaning this test can be used) for the duration of the COVID-19 declaration under Section 564(b)(1) of the Act, 21 U.S.C.section 360bbb-3(b)(1), unless the authorization is terminated  or revoked sooner.       Influenza A by PCR NEGATIVE NEGATIVE Final   Influenza B by PCR NEGATIVE NEGATIVE Final    Comment: (NOTE) The Xpert Xpress SARS-CoV-2/FLU/RSV plus assay is intended as an aid in the diagnosis of influenza from Nasopharyngeal swab specimens and should not be used as a sole basis for treatment. Nasal washings and aspirates are unacceptable for Xpert Xpress SARS-CoV-2/FLU/RSV testing.  Fact Sheet for Patients: EntrepreneurPulse.com.au  Fact Sheet for Healthcare Providers: IncredibleEmployment.be  This test is not yet approved or cleared by the [place] FDA and has been authorized for detection and/or diagnosis of SARS-CoV-2 by FDA under an Emergency Use Authorization (EUA). This EUA will remain in effect (meaning this test can be used) for the duration of the COVID-19 declaration under Section 564(b)(1) of the Act, 21 U.S.C. section 360bbb-3(b)(1), unless the authorization is terminated or revoked.  Performed at [facility], [place] [address], [place], [place] [address]   Urine Culture     Status: None   Collection Time: 12/22/20 12:32 PM   Specimen: In/Out Cath Urine  Result Value Ref Range Status   Specimen Description   Final    IN/OUT CATH URINE Performed at [facility], [place] [address] [place], [place] [address]    Special Requests   Final    NONE Performed at [facility], [place] [address], [place], [place] [address]    Culture   Final    NO GROWTH Performed at [facility], [place] [address], [place], [place] [address]    Report Status 12/23/2020  FINAL  Final  Respiratory (~20 pathogens) panel by PCR     Status: None   Collection Time: 12/22/20  1:47 PM   Specimen: Nasopharyngeal Swab; Respiratory  Result Value Ref Range Status   Adenovirus NOT DETECTED NOT DETECTED Final   Coronavirus 229E NOT DETECTED NOT DETECTED Final    Comment: (NOTE) The Coronavirus on the Respiratory Panel, DOES NOT test for the novel  Coronavirus (2019 nCoV)    Coronavirus HKU1 NOT DETECTED NOT DETECTED Final   Coronavirus NL63 NOT DETECTED  NOT DETECTED Final   Coronavirus OC43 NOT DETECTED NOT DETECTED Final   Metapneumovirus NOT DETECTED NOT DETECTED Final   Rhinovirus / Enterovirus NOT DETECTED NOT DETECTED Final   Influenza A NOT DETECTED NOT DETECTED Final   Influenza B NOT DETECTED NOT DETECTED Final   Parainfluenza Virus 1 NOT DETECTED NOT DETECTED Final   Parainfluenza Virus 2 NOT DETECTED NOT DETECTED Final   Parainfluenza Virus 3 NOT DETECTED NOT DETECTED Final   Parainfluenza Virus 4 NOT DETECTED NOT DETECTED Final   Respiratory Syncytial Virus NOT DETECTED NOT DETECTED Final   Bordetella pertussis NOT DETECTED NOT DETECTED Final   Bordetella Parapertussis NOT DETECTED NOT DETECTED Final   Chlamydophila pneumoniae NOT DETECTED NOT DETECTED Final   Mycoplasma pneumoniae NOT DETECTED NOT DETECTED Final    Comment: Performed at [facility], [place] [address], [place], [place] [address]         Radiology Studies: No results found.      Scheduled Meds:  acyclovir  800 mg Oral BID   amiodarone  200 mg Oral QPM   apixaban  2.5 mg Oral BID   ipratropium  0.5 mg Nebulization TID   levalbuterol  0.63 mg Nebulization TID   mouth rinse  15 mL Mouth Rinse BID   metoprolol succinate  50 mg Oral Daily   pantoprazole  40 mg Oral QHS   PARoxetine  10 mg Oral Daily   predniSONE  80 mg Oral Q breakfast   umeclidinium-vilanterol  1 puff Inhalation Daily   Continuous Infusions:     LOS: 5 days    Time spent: 40 minutes spent on  chart review, discussion with nursing staff, consultants, updating family and interview/physical exam; more than 50% of that time was spent in counseling and/or coordination of care.    Judas Mohammad J [place] DO [facility] Available via Epic secure chat 7am-7pm After these hours, please refer to coverage provider listed on [email or web address] 12/28/2020, 12:48 PM

## 2020-12-29 DIAGNOSIS — J189 Pneumonia, unspecified organism: Secondary | ICD-10-CM | POA: Diagnosis not present

## 2020-12-29 DIAGNOSIS — T50905A Adverse effect of unspecified drugs, medicaments and biological substances, initial encounter: Secondary | ICD-10-CM | POA: Diagnosis not present

## 2020-12-29 LAB — MAGNESIUM: Magnesium: 2.1 mg/dL (ref 1.7–2.4)

## 2020-12-29 LAB — BASIC METABOLIC PANEL
Anion gap: 5 (ref 5–15)
BUN: 19 mg/dL (ref 6–20)
CO2: 27 mmol/L (ref 22–32)
Calcium: 7.9 mg/dL — ABNORMAL LOW (ref 8.9–10.3)
Chloride: 103 mmol/L (ref 98–111)
Creatinine, Ser: 0.86 mg/dL (ref 0.61–1.24)
GFR, Estimated: >60 mL/min (ref >60)
Glucose, Bld: 166 mg/dL — ABNORMAL HIGH (ref 70–99)
Potassium: 3.4 mmol/L — ABNORMAL LOW (ref 3.5–5.1)
Sodium: 135 mmol/L (ref 135–145)

## 2020-12-29 LAB — CBC
HCT: 38 % — ABNORMAL LOW (ref 39.0–52.0)
Hemoglobin: 12.6 g/dL — ABNORMAL LOW (ref 13.0–17.0)
MCH: 28.7 pg (ref 26.0–34.0)
MCHC: 33.2 g/dL (ref 30.0–36.0)
MCV: 86.6 fL (ref 80.0–100.0)
Platelets: 262 10*3/uL (ref 150–400)
RBC: 4.39 MIL/uL (ref 4.22–5.81)
RDW: 14.6 % (ref 11.5–15.5)
WBC: 2.6 10*3/uL — ABNORMAL LOW (ref 4.0–10.5)
nRBC: 0 % (ref 0.0–0.2)

## 2020-12-29 MED ORDER — POTASSIUM CHLORIDE CRYS ER 20 MEQ PO TBCR
30.0000 meq | EXTENDED_RELEASE_TABLET | ORAL | Status: AC
  Administered 2020-12-29 (×2): 30 meq via ORAL
  Filled 2020-12-29 (×2): qty 1

## 2020-12-29 MED ORDER — METOPROLOL SUCCINATE ER 100 MG PO TB24
100.0000 mg | ORAL_TABLET | Freq: Every day | ORAL | Status: DC
  Administered 2020-12-29 – 2021-01-01 (×4): 100 mg via ORAL
  Filled 2020-12-29 (×4): qty 1

## 2020-12-29 NOTE — Progress Notes (Addendum)
HEMATOLOGY-ONCOLOGY PROGRESS NOTE  SUBJECTIVE: [name] reports improvement in his breathing.  He is currently on O2 at 2 L/min.  Remains on prednisone.  Oncology History  Stage 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 ([place])  04/10/2020 Initial Diagnosis   Stage 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 ([place])   04/10/2020 Cancer Staging   Staging form: Lung, AJCC 8th Edition - Clinical: Stage IVB (cT4, cN2, cM1c) - Signed by Curt Bears, MD on 04/10/2020    05/05/2020 -  Chemotherapy   Patient is on Treatment Plan : LUNG NSCLC Carboplatin + Paclitaxel + Pembrolizumab q21d x 4 cycles / Pembrolizumab Maintenance Q21D      PHYSICAL EXAMINATION:  Vitals:   12/28/20 2024 12/29/20 0402  BP: 104/75 103/66  Pulse: 94 73  Resp: (!) 21 18  Temp: 98 F (36.7 C) 98.6 F (37 C)  SpO2: 95% 95%   Filed Weights   12/22/20 0930 12/23/20 0400 12/25/20 1719  Weight: 93 kg 91.1 kg 93.9 kg    Intake/Output from previous day: 11/13 0701 - 11/14 0700 In: 240 [P.O.:240] Out: 1800 [Urine:1800]  GENERAL:alert, no distress and comfortable SKIN: skin color, texture, turgor are normal, no rashes or significant lesions EYES: normal, Conjunctiva are pink and non-injected, sclera clear OROPHARYNX:no exudate, no erythema and lips, buccal mucosa, and tongue normal  LUNGS: Faint wheezing HEART: Irregularly irregular, no lower extremity edema ABDOMEN:abdomen soft, non-tender and normal bowel sounds  NEURO: alert & oriented x 3 with fluent speech, no focal motor/sensory deficits  LABORATORY DATA:  I have reviewed the data as listed CMP Latest Ref Rng & Units 12/29/2020 12/27/2020 12/26/2020  Glucose 70 - 99 mg/dL 166(H) 135(H) 167(H)  BUN 6 - 20 mg/dL 19 18 17   Creatinine 0.61 - 1.24 mg/dL 0.86 0.66 0.86  Sodium 135 - 145 mmol/L 135 134(L) 134(L)  Potassium 3.5 - 5.1 mmol/L 3.4(L) 3.7 4.1  Chloride 98 - 111 mmol/L 103 104 102  CO2 22 - 32 mmol/L 27 27 27   Calcium 8.9 - 10.3 mg/dL 7.9(L)  8.3(L) 8.4(L)  Total Protein 6.5 - 8.1 g/dL - 5.2(L) 5.6(L)  Total Bilirubin 0.3 - 1.2 mg/dL - 0.5 0.4  Alkaline Phos 38 - 126 U/L - 46 48  AST 15 - 41 U/L - 31 35  ALT 0 - 44 U/L - 53(H) 46(H)    Lab Results  Component Value Date   WBC 2.6 (L) 12/29/2020   HGB 12.6 (L) 12/29/2020   HCT 38.0 (L) 12/29/2020   MCV 86.6 12/29/2020   PLT 262 12/29/2020   NEUTROABS 1.6 (L) 12/27/2020    DG Chest 2 View  Result Date: 12/18/2020 CLINICAL DATA:  Stage IV lung cancer. EXAM: CHEST - 2 VIEW COMPARISON:  Radiograph 10/10/2020, CT 822 22 FINDINGS: Normal cardiac silhouette. New streaky airspace consolidation in LEFT lung. Similar dense consolidative pattern in the RIGHT lung with central cavitation measuring 5.5 cm. There is some improvement density within the RIGHT lung about the cavitation. low lung volumes. No acute osseous abnormality. IMPRESSION: 1. New streaky airspace densities in the LEFT lung concerning for multifocal pneumonia versus lung cancer recurrence. 2. Persistent cavitation in the RIGHT mid lung. Some improvement in airspace density about the cavitation. These results will be called to the ordering clinician or representative by the Radiologist Assistant, and communication documented in the PACS or [employer]. Electronically Signed   By: Suzy Bouchard M.D.   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 Electronically Signed   By: Vinnie Langton M.D.   On: 12/22/2020 11:39   Result Date: 12/22/2020 CLINICAL DATA:  60 year old male with history of shortness of breath for 1 week. Evaluate for pulmonary embolism. EXAM: CT ANGIOGRAPHY CHEST WITH CONTRAST TECHNIQUE: Multidetector CT imaging of the chest was performed using the standard protocol  during bolus administration of intravenous contrast. Multiplanar CT image reconstructions and MIPs were obtained to evaluate the vascular anatomy. CONTRAST:  28mL OMNIPAQUE IOHEXOL 350 MG/ML SOLN COMPARISON:  Chest CT 10/06/2020. FINDINGS: Cardiovascular: Study is severely limited by a large amount of patient respiratory motion. With these limitations in mind, there is no central, lobar or proximal segmental sized filling defect to clearly indicate the presence of clinically significant pulmonary embolism. Smaller distal segmental and subsegmental sized pulmonary emboli cannot be entirely excluded. Heart size is normal. There is no significant pericardial fluid, thickening or pericardial calcification. There is aortic atherosclerosis, as well as atherosclerosis of the great vessels of the mediastinum and the coronary arteries, including calcified atherosclerotic plaque in the left anterior descending and right coronary arteries. Mediastinum/Nodes: Previously noted prominent but nonenlarged mediastinal lymph nodes appear stable compared to the prior examination. No definite pathologically enlarged mediastinal or hilar lymph nodes are noted. Esophagus is unremarkable in appearance. No axillary lymphadenopathy. Lungs/Pleura: Dense areas of airspace consolidation persist in the right upper and lower lobes. Interval cavitation of several areas of previously noted airspace consolidation in the right upper lobe posteriorly and in the superior segment of the right lower lobe. Today's study is most notable for widespread areas of ground-glass attenuation, septal thickening and regional areas of architectural distortion which have developed elsewhere in the lungs bilaterally, most evident throughout the mid to upper lungs, concerning for progressive pneumonitis. No definite new suspicious appearing pulmonary nodules or masses are noted. Mild diffuse bronchial wall thickening with mild centrilobular and paraseptal emphysema.  Upper Abdomen: Large right adrenal mass increased compared to the prior study, currently measuring 6.4 x 3.7 cm, compatible with a large metastatic lesion. Spleen is incompletely imaged, but appears likely enlarged measuring at least 15.1 x 8.6 cm on axial images. Peripherally calcified gallstone in the neck of the gallbladder measuring 1.8 cm in diameter. Musculoskeletal: There are no aggressive appearing lytic or blastic lesions noted in the visualized portions of the skeleton. Review of the MIP images confirms the above findings. IMPRESSION: 1. Limited study demonstrating no definite evidence of central, lobar or proximal segmental sized pulmonary embolism. Distal segmental and subsegmental sized emboli are not entirely excluded. 2. Worsening aeration in the lungs bilaterally, the appearance of which is concerning for progressively worsening pneumonitis as can be seen in the setting of immunotherapy (patient is reportedly on Keytruda). Further clinical evaluation is recommended. Severe multilobar bilateral pneumonia could be considered, however, lack of response to antibiotics makes this less likely unless the causative agent is not being effectively treated. 3. Aortic atherosclerosis, in addition to two vessel coronary artery disease. Please note that although the presence of coronary artery calcium documents the presence of coronary artery disease, the severity of this disease and any potential stenosis cannot be assessed on this non-gated CT examination. Assessment for potential risk factor modification, dietary therapy or pharmacologic therapy may be warranted, if clinically indicated. 4. Mild diffuse bronchial wall thickening with  mild centrilobular and paraseptal emphysema; imaging findings suggestive of underlying COPD. These results will be called to the ordering clinician or representative by the Radiologist Assistant, and communication documented in the PACS or [employer]. Aortic Atherosclerosis  (ICD10-I70.0) and Emphysema (ICD10-J43.9). Electronically Signed: By: Vinnie Langton M.D. On: 12/22/2020 11:27   DG CHEST PORT 1 VIEW  Result Date: 12/26/2020 CLINICAL DATA:  Small cell lung cancer, possible pneumonitis EXAM: PORTABLE CHEST 1 VIEW COMPARISON:  Prior chest x-ray 12/24/2020 FINDINGS: Stable cardiac and mediastinal contours. No significant interval change in the appearance of the chest. Extensive interstitial opacities bilaterally more general throughout the left lung and more focal on the right in the mid and upper lung. No new airspace infiltrate, pleural effusion or pneumothorax. Bullous changes in the right mid lung again noted. IMPRESSION: Stable appearance of the chest. No significant interval change compared to 12/24/2020. Persistent extensive interstitial airspace opacities throughout the left lung and primarily in the right upper lung. Electronically Signed   By: Jacqulynn Cadet M.D.   On: 12/26/2020 06:31   DG CHEST PORT 1 VIEW  Result Date: 12/24/2020 CLINICAL DATA:  Pneumonitis, assess for changes. EXAM: PORTABLE CHEST 1 VIEW COMPARISON:  December 22, 2020. FINDINGS: Trachea midline. Cardiomediastinal contours and hilar structures are stable. Persistent interstitial and alveolar opacities superimposed on cystic changes in the RIGHT chest. Stable appearance of the chest since previous imaging. On limited assessment no acute skeletal process. IMPRESSION: Stable low lung volumes with diffuse interstitial and alveolar opacities with cystic and or cavitary changes in the RIGHT chest. Electronically Signed   By: Zetta Bills M.D.   On: 12/24/2020 09:57   DG Chest Port 1 View  Result Date: 12/22/2020 CLINICAL DATA:  Shortness of breath, hypoxia EXAM: PORTABLE CHEST 1 VIEW COMPARISON:  12/18/2020 FINDINGS: No significant change in AP portable chest radiographs with diffuse bilateral interstitial heterogeneous airspace opacity, as well as a cavitary lesion of the right hand. No new  airspace opacity. The heart and mediastinum are unremarkable. IMPRESSION: No significant change in AP portable chest radiographs with diffuse bilateral interstitial heterogeneous airspace opacity, as well as a cavitary lesion of the right hand. No new airspace opacity. Electronically Signed   By: Delanna Ahmadi M.D.   On: 12/22/2020 10:22    ASSESSMENT AND PLAN: 1.  Metastatic squamous cell carcinoma of the right upper lung 2.  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  Paroxysmal atrial fibrillation 4.  Neutropenia 5.  Mild anemia 6.  COPD 7.  Hyperlipidemia 8.  Depression/anxiety 9.  GERD  John [name] has developed pneumonitis secondary to [facility].  He is receiving high-dose prednisone with improvement of his respiratory status.  We will plan to continue steroids with a very slow taper.  He will have further discussions with [name] following hospital discharge regarding treatment for his metastatic lung cancer.  The patient has neutropenia and has a longstanding history of this dating back to at least February 2022 and even predates his cancer diagnosis.  His WBC remained stable and he is afebrile.  We will plan to continue to observe.  He has atrial fibrillation with RVR.  He is followed by cardiology as an outpatient.  He will continue on his current medications per hospitalist.  Recommendations: 1.  Continue prednisone. 2.  He will have further discussions with [name] regarding treatment for his lung cancer. 3.  Continue management of atrial fibrillation per hospitalist and will need outpatient follow-up with cardiology.  Future Appointments  Date Time  Provider [place]  01/07/2021  8:30 AM [facility] None  01/07/2021  9:00 AM Ennever, Rudell Cobb, MD [facility] None  01/07/2021  9:30 AM [facility] A1 [facility] None  01/12/2021 10:30 AM Martyn Ehrich, NP [facility] None  01/29/2021  3:15 PM [name], MD [facility] None  02/19/2021  7:45 AM [facility]  [facility]  04/27/2021  9:00 AM Malka So R, PA [facility] None  05/15/2021  8:20 AM Sueanne Margarita, MD [facility]      LOS: 6 days   Mikey Bussing, DNP, AGPCNP-BC, AOCNP 12/29/20 John [name] was interviewed and examined.  He was admitted 12/22/2020 with dyspnea/hypoxia following treatment with pembrolizumab.  His clinical status has improved.  He is now on a slow steroid taper and continues supplemental oxygen.  He will be scheduled for outpatient follow-up with [name] to decide on salvage systemic therapy.  I was present for greater than 50% of today's visit.  I performed medical decision making.

## 2020-12-29 NOTE — TOC Progression Note (Signed)
Transition of Care [facility]) - Progression Note    Patient Details  Name: John [name] MRN: [id number] Date of Birth: 11-Apr-1960  Transition of Care [facility]) CM/SW Contact  Leeroy Cha, RN Phone Number: 12/29/2020, 10:38 AM  Clinical Narrative:    Acute hypoxic respiratory failure, POA Severe sepsis, POA Drug-induced pneumonitis vs community-acquired pneumonia Patient presenting to the ED with progressive shortness of breath despite outpatient treatment by pulmonology with Augmentin, prednisone and started on supplemental oxygen; which he is not on oxygen at baseline.  Chest x-ray with diffuse bilateral heterogeneous airspace opacities and CT angiogram chest negative for PE with worsening aeration of lungs bilaterally concerning for progressively worsening pneumonitis likely secondary to immunotherapy treatment with Keytruda.  Required 15 L NRB on ED presentation.  Met criteria on admission with leukopenia, tachycardia, tachycardia and hypotension that was responsive to IV fluid hydration.  PCCM signed off 11/10, outpatient follow-up 2 weeks --Oncology following, appreciate assistance --Prednisone 80 mg p.o. daily; will need 6 week slow taper on discharge --Anoro Ellipta 1 puff daily --Xopenex nebs q6h while awake and PRN --Continue supplemental oxygen, maintain SPO2 greater than 88%, on 3 L [place]    Leukopenia Etiology likely secondary to chemotherapy. --Continue prophylactic antiviral acyclovir --CBC daily   Hyponatremia Sodium 129 on admission, likely secondary to poor oral intake.  Improved. --Na 129>>132>134   Paroxysmal atrial fibrillation w/ RVR Follows with cardiology outpatient, Dr. Radford Pax.  Has required TEE/DCCV on 10/13/2020. --Amiodarone 265m PO daily --Increased metoprolol succinate to 50 mg p.o. daily --Eliquis 2.5 mg PO BID (on xarelto at home but changed to Eliquis due to compatibility with itraconazole) --Continue monitor on telemetry   Stage IV squamous cell  carcinoma right lung --Further per oncology.   COPD --Started on Anoro during hospitalization.  Pulmonology recommends LAMA/LABA on discharge (Stioloto vs Anoro vs Bevespri) --continue oxygen as above  TOC PLAN OF CARE: following for hhc and dme needs for home. Following for progression. Wbc=2.6 o2 at 2l/min [place]   Barriers to Discharge: Continued Medical Work up  Expected Discharge Plan and Services                                                 Social Determinants of Health (SDOH) Interventions    Readmission Risk Interventions No flowsheet data found.

## 2020-12-29 NOTE — Plan of Care (Signed)
  Problem: Education: Goal: Knowledge of General Education information will improve Description: Including pain rating scale, medication(s)/side effects and non-pharmacologic comfort measures Outcome: Progressing   Problem: Health Behavior/Discharge Planning: Goal: Ability to manage health-related needs will improve Outcome: Progressing   Problem: Clinical Measurements: Goal: Ability to maintain clinical measurements within normal limits will improve Outcome: Progressing Goal: Will remain free from infection Outcome: Progressing Goal: Diagnostic test results will improve Outcome: Progressing Goal: Respiratory complications will improve Outcome: Progressing Goal: Cardiovascular complication will be avoided Outcome: Progressing   Problem: Activity: Goal: Risk for activity intolerance will decrease Outcome: Progressing   Problem: Nutrition: Goal: Adequate nutrition will be maintained Outcome: Progressing   Problem: Coping: Goal: Level of anxiety will decrease Outcome: Progressing   Problem: Elimination: Goal: Will not experience complications related to bowel motility Outcome: Progressing Goal: Will not experience complications related to urinary retention Outcome: Progressing   Problem: Pain Managment: Goal: General experience of comfort will improve Outcome: Progressing   Problem: Safety: Goal: Ability to remain free from injury will improve Outcome: Progressing   Problem: Skin Integrity: Goal: Risk for impaired skin integrity will decrease Outcome: Progressing   Problem: Activity: Goal: Ability to tolerate increased activity will improve Outcome: Progressing Goal: Will verbalize the importance of balancing activity with adequate rest periods Outcome: Progressing   Problem: Respiratory: Goal: Ability to maintain a clear airway will improve Outcome: Progressing Goal: Levels of oxygenation will improve Outcome: Progressing Goal: Ability to maintain adequate  ventilation will improve Outcome: Progressing

## 2020-12-29 NOTE — Progress Notes (Signed)
PROGRESS NOTE    John [name]  DEY:[id number] DOB: [birth year] DOA: 12/22/2020 PCP: [name], MD    Brief Narrative:  John [name] is a 60 year old male with past medical history significant for stage 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 who just completed 11th cycle of Keytruda, paroxysmal atrial fibrillation who presented to [facility] ED on 11/7 with progressive shortness of breath x1 week.  Associated with increased productive cough with white sputum and sweats/malaise/fatigue.  Recently seen in outpatient pulmonology office and prescribed Augmentin and prednisone with supple oxygen.  Patient's shortness of breath progressed on 6 L nasal cannula which prompted evaluation in the ED.  In the ED, patient requiring 15 L NRB, chest x-ray with diffuse bilateral heterogeneous opacities.  CTA chest without PE but findings consistent with progressive pneumonitis.  Lactic acid 2.0.  WBC 1.9.  ABG with pH 7.39, PCO2 46, PO2 26.  Patient was given IV Solu-Medrol, vancomycin, cefepime, azithromycin, Xopenex nebs.  Hospitalist service consulted for further evaluation and management of acute hypoxic respiratory failure secondary to commune acquired pneumonia versus drug-induced pneumonitis.   Assessment & Plan:   Principal Problem:   Drug-induced pneumonitis Active Problems:   COPD with asthma ([place])   Stage 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 (HCC)   Hyponatremia   PAF (paroxysmal atrial fibrillation) (HCC)   Severe sepsis (HCC)   Acute on chronic respiratory failure with hypoxia (HCC)   Leukocytopenia   Acute hypoxic respiratory failure, POA Severe sepsis, POA Drug-induced pneumonitis vs community-acquired pneumonia Patient presenting to the ED with progressive shortness of breath despite outpatient treatment by pulmonology with Augmentin, prednisone and started on supplemental oxygen; which he is not on oxygen at baseline.  Chest x-ray with diffuse bilateral heterogeneous airspace  opacities and CT angiogram chest negative for PE with worsening aeration of lungs bilaterally concerning for progressively worsening pneumonitis likely secondary to immunotherapy treatment with Keytruda.  Required 15 L NRB on ED presentation.  Met criteria on admission with leukopenia, tachycardia, tachycardia and hypotension that was responsive to IV fluid hydration.  PCCM signed off 11/10, outpatient follow-up 2 weeks (apt on 11/28 and 12/12) --Oncology following, appreciate assistance --Prednisone 80 mg p.o. daily; will need 6 week slow taper on discharge --Anoro Ellipta 1 puff daily --Xopenex nebs q6h while awake and PRN --Continue supplemental oxygen, maintain SPO2 greater than 88%, on 2 L [place]   Leukopenia Etiology likely secondary to chemotherapy. --Continue prophylactic antiviral acyclovir --CBC daily  Hyponatremia Sodium 129 on admission, likely secondary to poor oral intake.  Improved. --Na 129>>132>134  Paroxysmal atrial fibrillation w/ RVR Follows with cardiology outpatient, Dr. Radford Pax.  Has required TEE/DCCV on 10/13/2020. --Amiodarone 249m PO daily --Increased metoprolol succinate to 100 mg p.o. daily --Eliquis 2.5 mg PO BID (on xarelto at home but changed to Eliquis due to incompatibility with itraconazole) --Continue monitor on telemetry; if remains poorly controlled will likely need cardiology input for possible DCCV given his borderline hypotension  Stage IV squamous cell carcinoma right lung --Further per oncology.  COPD --Started on Anoro during hospitalization.  Pulmonology recommends LAMA/LABA on discharge (Stioloto vs Anoro vs Bevespri) --continue oxygen as above  HLD: Atorvastatin 10 mg p.o. daily  Depression/anxiety: Paxil 10 mg p.o. daily  GERD: Continue PPI   DVT prophylaxis: apixaban (ELIQUIS) tablet 2.5 mg Start: 12/23/20 2200 apixaban (ELIQUIS) tablet 2.5 mg    Code Status: Full Code Family Communication: No family present at bedside this morning,  spouse updated on patient's telephone in room this  morning  Disposition Plan:  Level of care: Med-Surg Status is: Inpatient  Remains inpatient appropriate because: Continue to wean oxygen, transitioned to oral steroids, need to ensure respiratory stability prior to discharge home; now with A. fib with RVR, increasing metoprolol succinate again today    Consultants:  PCCM, Dr. Carlis Abbott - signed off 11/10 Medical oncology, [name]  Procedures:  None  Antimicrobials:  Azithromycin 11/7 - 11/10 Cefepime 11/7 - 11/10 Itraconazole 11/8 - 11/10 Acyclovir 11/8>> Vancomycin 11/7 -11/7    Subjective: Patient seen examined at bedside, resting comfortably in bed.  Continues to be anxious about his atrial fibrillation rate.  Breathing continues to improve, now down to 2 L nasal cannula.  Did not tolerate much ambulation yesterday due to tachycardia.  Patient continues to feel weak and fatigued.  Denies headache, no visual changes, no fever/chills/night sweats, no nausea/vomiting/diarrhea, no chest pain, no palpitations, no abdominal pain, no weakness, no fatigue, no paresthesias.  No acute events overnight per nursing staff.  Objective: Vitals:   12/28/20 1456 12/28/20 1935 12/28/20 2024 12/29/20 0402  BP:   104/75 103/66  Pulse:   94 73  Resp:   (!) 21 18  Temp:   98 F (36.7 C) 98.6 F (37 C)  TempSrc:   Oral Oral  SpO2: 93% 97% 95% 95%  Weight:      Height:        Intake/Output Summary (Last 24 hours) at 12/29/2020 1215 Last data filed at 12/29/2020 0404 Gross per 24 hour  Intake 240 ml  Output 1800 ml  Net -1560 ml   Filed Weights   12/22/20 0930 12/23/20 0400 12/25/20 1719  Weight: 93 kg 91.1 kg 93.9 kg    Examination:  General exam: Appears calm and comfortable  Respiratory system: Mild late expiratory wheezing bilateral lung fields, no crackles, normal respiratory effort without accessory muscle use, on 2 L nasal cannula with SPO2 92% at rest Cardiovascular  system: S1 & S2 heard, irregularly irregular rhythm, tachycardic. No JVD, murmurs, rubs, gallops or clicks. No pedal edema. Gastrointestinal system: Abdomen is nondistended, soft and nontender. No organomegaly or masses felt. Normal bowel sounds heard. Central nervous system: Alert and oriented. No focal neurological deficits. Extremities: Symmetric 5 x 5 power. Skin: No rashes, lesions or ulcers Psychiatry: Judgement and insight appear normal. Mood & affect appropriate.     Data Reviewed: I have personally reviewed following labs and imaging studies  CBC: Recent Labs  Lab 12/24/20 0236 12/25/20 0235 12/26/20 0341 12/27/20 0432 12/29/20 0343  WBC 1.8* 1.9* 2.2* 2.4* 2.6*  NEUTROABS 1.3* 1.4* 1.5* 1.6*  --   HGB 9.7* 9.8* 10.1* 10.4* 12.6*  HCT 29.1* 30.0* 30.2* 30.6* 38.0*  MCV 85.6 87.2 85.8 84.8 86.6  PLT 307 314 311 294 409   Basic Metabolic Panel: Recent Labs  Lab 12/24/20 0236 12/25/20 0235 12/26/20 0341 12/27/20 0432 12/29/20 0343  NA 132* 134* 134* 134* 135  K 3.7 3.9 4.1 3.7 3.4*  CL 102 103 102 104 103  CO2 _0 GLUCOSE 199* 183* 167* 135* 166*  BUN _1 CREATININE 0.77 0.78 0.86 0.66 0.86  CALCIUM 8.0* 8.3* 8.4* 8.3* 7.9*  MG  --   --   --   --  2.1   GFR: Estimated Creatinine Clearance: 108.2 mL/min (by C-G formula based on SCr of 0.86 mg/dL). Liver Function Tests: Recent Labs  Lab 12/23/20 0239 12/24/20 0236 12/25/20 0235 12/26/20 0341 12/27/20  0432  AST _0 35 31  ALT _1 46* 53*  ALKPHOS 58 49 46 48 46  BILITOT 0.7 0.5 0.5 0.4 0.5  PROT 6.1* 5.7* 5.6* 5.6* 5.2*  ALBUMIN 2.3* 2.3* 2.3* 2.4* 2.3*   No results for input(s): LIPASE, AMYLASE in the last 168 hours. No results for input(s): AMMONIA in the last 168 hours. Coagulation Profile: No results for input(s): INR, PROTIME in the last 168 hours.  Cardiac Enzymes: No results for input(s): CKTOTAL, CKMB, CKMBINDEX, TROPONINI in the last 168 hours. BNP (last  3 results) No results for input(s): PROBNP in the last 8760 hours. HbA1C: No results for input(s): HGBA1C in the last 72 hours. CBG: No results for input(s): GLUCAP in the last 168 hours. Lipid Profile: No results for input(s): CHOL, HDL, LDLCALC, TRIG, CHOLHDL, LDLDIRECT in the last 72 hours. Thyroid Function Tests: No results for input(s): TSH, T4TOTAL, FREET4, T3FREE, THYROIDAB in the last 72 hours. Anemia Panel: No results for input(s): VITAMINB12, FOLATE, FERRITIN, TIBC, IRON, RETICCTPCT in the last 72 hours. Sepsis Labs: Recent Labs  Lab 12/22/20 1234 12/23/20 0239 12/24/20 0236  PROCALCITON 0.22 0.39 0.20  LATICACIDVEN 1.3  --   --     Recent Results (from the past 240 hour(s))  Blood Culture (routine x 2)     Status: None   Collection Time: 12/22/20 10:03 AM   Specimen: BLOOD  Result Value Ref Range Status   Specimen Description   Final    BLOOD BLOOD RIGHT FOREARM Performed at [facility] [address] [place], [place] [address]    Special Requests   Final    BOTTLES DRAWN AEROBIC ONLY Blood Culture adequate volume Performed at [facility] [address], [place], [place] [address]    Culture   Final    NO GROWTH 5 DAYS Performed at [facility], [place] [address] [place], [place] [address]    Report Status 12/27/2020 FINAL  Final  Blood Culture (routine x 2)     Status: None   Collection Time: 12/22/20 10:08 AM   Specimen: BLOOD  Result Value Ref Range Status   Specimen Description   Final    BLOOD LEFT ANTECUBITAL Performed at [facility] [address] [place], [place] [address]    Special Requests   Final    BOTTLES DRAWN AEROBIC AND ANAEROBIC Blood Culture adequate volume Performed at [facility] [address], [name], [place] [address]    Culture   Final    NO GROWTH 5 DAYS Performed at [facility], [place] [address], [place], [place] [address]     Report Status 12/27/2020 FINAL  Final  MRSA Next Gen by PCR, Nasal     Status: None   Collection Time: 12/22/20 10:08 AM   Specimen: Nasopharyngeal Swab; Nasal Swab  Result Value Ref Range Status   MRSA by PCR Next Gen NOT DETECTED NOT DETECTED Final    Comment: (NOTE) The GeneXpert MRSA Assay (FDA approved for NASAL specimens only), is one component of a comprehensive MRSA colonization surveillance program. It is not intended to diagnose MRSA infection nor to guide or monitor treatment for MRSA infections. Test performance is not FDA approved in patients less than 62 years old. Performed at [facility], [place] [address] [place], [place] [address]   Resp Panel by RT-PCR (Flu A&B, Covid) Nasopharyngeal Swab     Status: None  Collection Time: 12/22/20 10:27 AM   Specimen: Nasopharyngeal Swab; Nasopharyngeal(NP) swabs in vial transport medium  Result Value Ref Range Status   SARS Coronavirus 2 by RT PCR NEGATIVE NEGATIVE Final    Comment: (NOTE) SARS-CoV-2 target nucleic acids are NOT DETECTED.  The SARS-CoV-2 RNA is generally detectable in upper respiratory specimens during the acute phase of infection. The lowest concentration of SARS-CoV-2 viral copies this assay can detect is 138 copies/mL. A negative result does not preclude SARS-Cov-2 infection and should not be used as the sole basis for treatment or other patient management decisions. A negative result may occur with  improper specimen collection/handling, submission of specimen other than nasopharyngeal swab, presence of viral mutation(s) within the areas targeted by this assay, and inadequate number of viral copies(<138 copies/mL). A negative result must be combined with clinical observations, patient history, and epidemiological information. The expected result is Negative.  Fact Sheet for Patients:  EntrepreneurPulse.com.au  Fact Sheet for Healthcare Providers:   IncredibleEmployment.be  This test is no t yet approved or cleared by the [place] FDA and  has been authorized for detection and/or diagnosis of SARS-CoV-2 by FDA under an Emergency Use Authorization (EUA). This EUA will remain  in effect (meaning this test can be used) for the duration of the COVID-19 declaration under Section 564(b)(1) of the Act, 21 U.S.C.section 360bbb-3(b)(1), unless the authorization is terminated  or revoked sooner.       Influenza A by PCR NEGATIVE NEGATIVE Final   Influenza B by PCR NEGATIVE NEGATIVE Final    Comment: (NOTE) The Xpert Xpress SARS-CoV-2/FLU/RSV plus assay is intended as an aid in the diagnosis of influenza from Nasopharyngeal swab specimens and should not be used as a sole basis for treatment. Nasal washings and aspirates are unacceptable for Xpert Xpress SARS-CoV-2/FLU/RSV testing.  Fact Sheet for Patients: EntrepreneurPulse.com.au  Fact Sheet for Healthcare Providers: IncredibleEmployment.be  This test is not yet approved or cleared by the [place] FDA and has been authorized for detection and/or diagnosis of SARS-CoV-2 by FDA under an Emergency Use Authorization (EUA). This EUA will remain in effect (meaning this test can be used) for the duration of the COVID-19 declaration under Section 564(b)(1) of the Act, 21 U.S.C. section 360bbb-3(b)(1), unless the authorization is terminated or revoked.  Performed at [facility], [place] [address] [place], [place] [address]   Urine Culture     Status: None   Collection Time: 12/22/20 12:32 PM   Specimen: In/Out Cath Urine  Result Value Ref Range Status   Specimen Description   Final    IN/OUT CATH URINE Performed at [facility], [place] [address], [place], [place] [address]    Special Requests   Final    NONE Performed at [facility], [place] [address]  [place], [place] [address]    Culture   Final    NO GROWTH Performed at [facility], [place] [address], [place], [place] [address]    Report Status 12/23/2020 FINAL  Final  Respiratory (~20 pathogens) panel by PCR     Status: None   Collection Time: 12/22/20  1:47 PM   Specimen: Nasopharyngeal Swab; Respiratory  Result Value Ref Range Status   Adenovirus NOT DETECTED NOT DETECTED Final   Coronavirus 229E NOT DETECTED NOT DETECTED Final    Comment: (NOTE) The Coronavirus on the Respiratory Panel, DOES NOT test for the novel  Coronavirus (2019 nCoV)    Coronavirus HKU1 NOT DETECTED NOT  DETECTED Final   Coronavirus NL63 NOT DETECTED NOT DETECTED Final   Coronavirus OC43 NOT DETECTED NOT DETECTED Final   Metapneumovirus NOT DETECTED NOT DETECTED Final   Rhinovirus / Enterovirus NOT DETECTED NOT DETECTED Final   Influenza A NOT DETECTED NOT DETECTED Final   Influenza B NOT DETECTED NOT DETECTED Final   Parainfluenza Virus 1 NOT DETECTED NOT DETECTED Final   Parainfluenza Virus 2 NOT DETECTED NOT DETECTED Final   Parainfluenza Virus 3 NOT DETECTED NOT DETECTED Final   Parainfluenza Virus 4 NOT DETECTED NOT DETECTED Final   Respiratory Syncytial Virus NOT DETECTED NOT DETECTED Final   Bordetella pertussis NOT DETECTED NOT DETECTED Final   Bordetella Parapertussis NOT DETECTED NOT DETECTED Final   Chlamydophila pneumoniae NOT DETECTED NOT DETECTED Final   Mycoplasma pneumoniae NOT DETECTED NOT DETECTED Final    Comment: Performed at [facility], [place] [address], [place], [place] [address]         Radiology Studies: No results found.      Scheduled Meds:  acyclovir  800 mg Oral BID   amiodarone  200 mg Oral QPM   apixaban  2.5 mg Oral BID   ipratropium  0.5 mg Nebulization TID   levalbuterol  0.63 mg Nebulization TID   mouth rinse  15 mL Mouth Rinse BID   metoprolol succinate  100 mg Oral Daily   pantoprazole  40 mg Oral QHS   PARoxetine  10 mg Oral Daily    potassium chloride  30 mEq Oral Q3H   predniSONE  80 mg Oral Q breakfast   umeclidinium-vilanterol  1 puff Inhalation Daily   Continuous Infusions:     LOS: 6 days    Time spent: 40 minutes spent on chart review, discussion with nursing staff, consultants, updating family and interview/physical exam; more than 50% of that time was spent in counseling and/or coordination of care.    Adorian Gwynne J [place] DO [facility] Available via Epic secure chat 7am-7pm After these hours, please refer to coverage provider listed on [email or web address] 12/29/2020, 12:15 PM

## 2020-12-30 ENCOUNTER — Inpatient Hospital Stay (HOSPITAL_COMMUNITY): Payer: 59

## 2020-12-30 DIAGNOSIS — T50905A Adverse effect of unspecified drugs, medicaments and biological substances, initial encounter: Secondary | ICD-10-CM | POA: Diagnosis not present

## 2020-12-30 DIAGNOSIS — I480 Paroxysmal atrial fibrillation: Secondary | ICD-10-CM

## 2020-12-30 DIAGNOSIS — J189 Pneumonia, unspecified organism: Secondary | ICD-10-CM | POA: Diagnosis not present

## 2020-12-30 LAB — BASIC METABOLIC PANEL
Anion gap: 4 — ABNORMAL LOW (ref 5–15)
BUN: 23 mg/dL — ABNORMAL HIGH (ref 6–20)
CO2: 26 mmol/L (ref 22–32)
Calcium: 8.2 mg/dL — ABNORMAL LOW (ref 8.9–10.3)
Chloride: 102 mmol/L (ref 98–111)
Creatinine, Ser: 0.76 mg/dL (ref 0.61–1.24)
GFR, Estimated: >60 mL/min (ref >60)
Glucose, Bld: 97 mg/dL (ref 70–99)
Potassium: 4.1 mmol/L (ref 3.5–5.1)
Sodium: 132 mmol/L — ABNORMAL LOW (ref 135–145)

## 2020-12-30 LAB — MAGNESIUM: Magnesium: 1.9 mg/dL (ref 1.7–2.4)

## 2020-12-30 MED ORDER — LEVALBUTEROL HCL 0.63 MG/3ML IN NEBU
0.6300 mg | INHALATION_SOLUTION | Freq: Two times a day (BID) | RESPIRATORY_TRACT | Status: DC
  Administered 2020-12-30 – 2021-01-01 (×4): 0.63 mg via RESPIRATORY_TRACT
  Filled 2020-12-30 (×4): qty 3

## 2020-12-30 MED ORDER — PREDNISONE 50 MG PO TABS
60.0000 mg | ORAL_TABLET | Freq: Every day | ORAL | Status: DC
  Administered 2020-12-31 – 2021-01-01 (×2): 60 mg via ORAL
  Filled 2020-12-30 (×2): qty 1

## 2020-12-30 MED ORDER — IPRATROPIUM BROMIDE 0.02 % IN SOLN
0.5000 mg | Freq: Two times a day (BID) | RESPIRATORY_TRACT | Status: DC
  Administered 2020-12-30 – 2021-01-01 (×4): 0.5 mg via RESPIRATORY_TRACT
  Filled 2020-12-30 (×4): qty 2.5

## 2020-12-30 MED ORDER — SODIUM CHLORIDE 0.9 % IV BOLUS
1000.0000 mL | Freq: Once | INTRAVENOUS | Status: AC
Start: 2020-12-30 — End: 2020-12-30
  Administered 2020-12-30: 1000 mL via INTRAVENOUS

## 2020-12-30 NOTE — Plan of Care (Signed)
  Problem: Education: Goal: Knowledge of General Education information will improve Description: Including pain rating scale, medication(s)/side effects and non-pharmacologic comfort measures Outcome: Progressing   Problem: Health Behavior/Discharge Planning: Goal: Ability to manage health-related needs will improve Outcome: Progressing   Problem: Clinical Measurements: Goal: Ability to maintain clinical measurements within normal limits will improve Outcome: Progressing Goal: Will remain free from infection Outcome: Progressing Goal: Diagnostic test results will improve Outcome: Progressing Goal: Respiratory complications will improve Outcome: Progressing Goal: Cardiovascular complication will be avoided Outcome: Progressing   Problem: Activity: Goal: Risk for activity intolerance will decrease Outcome: Progressing   Problem: Nutrition: Goal: Adequate nutrition will be maintained Outcome: Progressing   Problem: Coping: Goal: Level of anxiety will decrease Outcome: Progressing   Problem: Elimination: Goal: Will not experience complications related to bowel motility Outcome: Progressing Goal: Will not experience complications related to urinary retention Outcome: Progressing   Problem: Pain Managment: Goal: General experience of comfort will improve Outcome: Progressing   Problem: Safety: Goal: Ability to remain free from injury will improve Outcome: Progressing   Problem: Skin Integrity: Goal: Risk for impaired skin integrity will decrease Outcome: Progressing   Problem: Activity: Goal: Ability to tolerate increased activity will improve Outcome: Progressing Goal: Will verbalize the importance of balancing activity with adequate rest periods Outcome: Progressing   Problem: Respiratory: Goal: Ability to maintain a clear airway will improve Outcome: Progressing Goal: Levels of oxygenation will improve Outcome: Progressing Goal: Ability to maintain adequate  ventilation will improve Outcome: Progressing

## 2020-12-30 NOTE — Consult Note (Addendum)
Cardiology Consultation:   Patient ID: John [name] MRN: [id number]; DOB: Jul 18, 1960  Admit date: 12/22/2020 Date of Consult: 12/30/2020  PCP:  [name], MD   [facility] Providers Cardiologist:  Fransico Him, MD   {   Patient Profile:   John [name] is a 60 y.o. 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 [place]  John [name] was initially dx with afib in 04/2020 after presenting to the ED with generalized weakness having recently started chemotherapy for his lung CA. He was tachycardic in rapid atrial fib. He was hospitalized for further management of C diff diarrhea, sepsis and febrile neuropenia. Anticoagulation was deferred due to elevated bleeding risk with tumor pressing on pulmonary artery, also CHADSVASC 0.. He was started on amiodarone which converted him to NSR. 2D Echo 05/12/20 showed EF 60-65%, no RWMa, no significant valve disease. There was a concern about lung toxicity with either amiodarone or Keytruda on CT 07/18/20 so amiodarone was stopped at 08/25/20 afib clinic.  Hospitalized 09/2020 with neutropenic fever and pseudomonal bacteremia/sepsis.  He had recurrent atrial fibrillation and after discussion with pulmonary who did not feel like changes on lung imaging were from amiodarone, amiodarone was resumed and he underwent cardioversion 10/13/2020.  He was placed on Xarelto. Coronary calcification 2 vessels on CT 09/2020.   Last seen in afib clinic 12/16/2020. He was sinus rhythm. Continued amiodarone, BB and Xarelto.   History of Present Illness:   John [name] presented 11/7 with progressive worsening SOB, productive cough with sputum and fatigue despite tx with Augmentin, steroids and supplemental oxygen. He was found hypoxic and required  15 L NRB. He was admitted for sepsis with acute hypoxic respiratory failure 2nd to CAP vs drug induced pneumonitis from [facility].  CTA of chest without PE. He was treated with IV steroids and broad spectrum antibiotics. Patient was initially followed by pulmonary who recommended to continue amiodarone 200mg  daily.  He was also followed by oncology.   He was in sinus rhythm on admit. Went into afib RVR on 11/12. His Toprol XL increased to 100mg  daily from 25mg  PTA. His heart rate has remained fairly controlled at 80s but goes up at 100s with movement and comes back  . BP soft low, Most recent reading 98/70.  His Xarelto changed to Eliquis 2.5mg  BID due to incompatibility with itraconazole.   K 3.4 Scr 0.86 Chest x-ray 12/26/2020 IMPRESSION: Stable appearance of the chest. No significant interval change compared to 12/24/2020.   Persistent extensive interstitial airspace opacities throughout the left lung and primarily in the right upper lung.   Past Medical History:  Diagnosis Date   Dyspnea    Family history of adverse reaction to anesthesia    brother with seizures had episode under anesthesia.  Patient has seizures as well.   GERD (gastroesophageal reflux disease)    History of radiation therapy 04/24/2020-05/16/2020   IMRT to right lung     Dr [name]   PAF (paroxysmal atrial fibrillation) ([place])    CHADS2VSAC score 0   Rheumatoid aortitis    Squamous cell lung cancer [facility])     Past Surgical History:  Procedure Laterality Date   BRONCHIAL BRUSHINGS  03/27/2020   Procedure: BRONCHIAL BRUSHINGS;  Surgeon: [name], MD;  Location: [place];  Service: Cardiopulmonary;;   BUBBLE  STUDY  10/13/2020   Procedure: BUBBLE STUDY;  Surgeon: Pixie Casino, MD;  Location: [place];  Service: Cardiovascular;;   CARDIOVERSION N/A 10/13/2020   Procedure: CARDIOVERSION;  Surgeon: Pixie Casino, MD;  Location: [facility] ENDOSCOPY;  Service: Cardiovascular;  Laterality: N/A;   CATARACT  EXTRACTION  2016   at [place]  03/27/2020   Procedure: FINE NEEDLE ASPIRATION;  Surgeon: [name], MD;  Location: [place];  Service: Cardiopulmonary;;   HIP SURGERY Left    TEE WITHOUT CARDIOVERSION N/A 10/13/2020   Procedure: TRANSESOPHAGEAL ECHOCARDIOGRAM (TEE);  Surgeon: Pixie Casino, MD;  Location: [place];  Service: Cardiovascular;  Laterality: N/A;   VIDEO BRONCHOSCOPY WITH ENDOBRONCHIAL ULTRASOUND N/A 03/27/2020   Procedure: VIDEO BRONCHOSCOPY WITH ENDOBRONCHIAL ULTRASOUND;  Surgeon: [name], MD;  Location: [place];  Service: Cardiopulmonary;  Laterality: N/A;     Inpatient Medications: Scheduled Meds:  acyclovir  800 mg Oral BID   amiodarone  200 mg Oral QPM   apixaban  2.5 mg Oral BID   ipratropium  0.5 mg Nebulization TID   levalbuterol  0.63 mg Nebulization TID   mouth rinse  15 mL Mouth Rinse BID   metoprolol succinate  100 mg Oral Daily   pantoprazole  40 mg Oral QHS   PARoxetine  10 mg Oral Daily   [START ON 12/31/2020] predniSONE  60 mg Oral Q breakfast   umeclidinium-vilanterol  1 puff Inhalation Daily   Continuous Infusions:  PRN Meds: acetaminophen **OR** acetaminophen, levalbuterol, ondansetron **OR** ondansetron (ZOFRAN) IV  Allergies:    Allergies  Allergen Reactions   Albuterol Other (See Comments)    Patient has had episode of atrial fib following administration of albuterol (tolerates Xopenex well)    Social History:   Social History   Socioeconomic History   Marital status: Married    Spouse name: Not on file   Number of children: Not on file   Years of education: Not on file   Highest education level: Not on file  Occupational History   Not on file  Tobacco Use   Smoking status: Former    Packs/day: 1.00    Years: 30.00    Pack years: 30.00    Types: Cigarettes    Quit date: 2018    Years since quitting: 4.8   Smokeless tobacco: Never   Tobacco comments:    Former smoker 10/28/2020   Vaping Use   Vaping Use: Never used  Substance and Sexual Activity   Alcohol use: Not Currently   Drug use: Never   Sexual activity: Not on file  Other Topics Concern   Not on file  Social History Narrative   ** Merged History Encounter **       Social Determinants of Health   Financial Resource Strain: Not on file  Food Insecurity: Not on file  Transportation Needs: Not on file  Physical Activity: Not on file  Stress: Not on file  Social Connections: Not on file  Intimate Partner Violence: Not on file    Family History:   Family History  Problem Relation Age of Onset   Arrhythmia Mother        has PPM   Heart disease Father        Died at 63, started in his 27s, heart attacks, had PPM and ICD     ROS:  Please see the history of present illness.  All other ROS reviewed and negative.  Physical Exam/Data:   Vitals:   12/30/20 0837 12/30/20 0900 12/30/20 0903 12/30/20 0940  BP: (!) 81/63 (!) 89/60  98/70  Pulse: 83   98  Resp: 18     Temp: 97.7 F (36.5 C)     TempSrc: Oral     SpO2: 96%  97%   Weight:      Height:        Intake/Output Summary (Last 24 hours) at 12/30/2020 1101 Last data filed at 12/30/2020 0440 Gross per 24 hour  Intake 240 ml  Output 775 ml  Net -535 ml   Last 3 Weights 12/25/2020 12/23/2020 12/22/2020  Weight (lbs) 207 lb 0.2 oz 200 lb 13.4 oz 205 lb  Weight (kg) 93.9 kg 91.1 kg 92.987 kg     Body mass index is 28.87 kg/m.  General:  Well nourished, well developed, in no acute distress HEENT: normal Neck: no JVD Vascular: No carotid bruits; Distal pulses 2+ bilaterally Cardiac:  normal S1, S2; Ir IR,  no murmur  Lungs: Course breath sound  Abd: soft, nontender, no hepatomegaly  Ext: no edema Musculoskeletal:  No deformities, BUE and BLE strength normal and equal Skin: warm and dry  Neuro:  CNs 2-12 intact, no focal abnormalities noted Psych:  Normal affect   EKG:  The EKG was personally reviewed and demonstrates:  afib at  rate of 140s Telemetry:  Telemetry was personally reviewed and demonstrates:  Atrial fibrillation at rate of 80s, intermittently in 100s  Relevant CV Studies:  Echo 05/12/2020 1. Left ventricular ejection fraction, by estimation, is 60 to 65%. The  left ventricle has normal function. The left ventricle has no regional  wall motion abnormalities. Left ventricular diastolic parameters are  indeterminate.   2. Right ventricular systolic function is normal. The right ventricular  size is normal. Tricuspid regurgitation signal is inadequate for assessing  PA pressure.   3. The mitral valve is normal in structure. Trivial mitral valve  regurgitation. No evidence of mitral stenosis.   4. The aortic valve is tricuspid. Aortic valve regurgitation is not  visualized. No aortic stenosis is present.   5. The inferior vena cava is normal in size with greater than 50%  respiratory variability, suggesting right atrial pressure of 3 mmHg.   6. The patient is in atrial fibrillation.   Laboratory Data:  High Sensitivity Troponin:   Recent Labs  Lab 12/22/20 1000 12/22/20 1234  TROPONINIHS 12 11     Chemistry Recent Labs  Lab 12/26/20 0341 12/27/20 0432 12/29/20 0343  NA 134* 134* 135  K 4.1 3.7 3.4*  CL 102 104 103  CO2 27 27 27   GLUCOSE 167* 135* 166*  BUN 17 18 19   CREATININE 0.86 0.66 0.86  CALCIUM 8.4* 8.3* 7.9*  MG  --   --  2.1  GFRNONAA >60 >60 >60  ANIONGAP 5 3* 5    Recent Labs  Lab 12/25/20 0235 12/26/20 0341 12/27/20 0432  PROT 5.6* 5.6* 5.2*  ALBUMIN 2.3* 2.4* 2.3*  AST 27 35 31  ALT 26 46* 53*  ALKPHOS 46 48 46  BILITOT 0.5 0.4 0.5    Hematology Recent Labs  Lab 12/26/20 0341 12/27/20 0432 12/29/20 0343  WBC 2.2* 2.4* 2.6*  RBC 3.52* 3.61* 4.39  HGB 10.1* 10.4* 12.6*  HCT 30.2* 30.6* 38.0*  MCV 85.8 84.8 86.6  MCH 28.7 28.8 28.7  MCHC 33.4 34.0 33.2  RDW 14.2 14.2 14.6  PLT 311 294 262    Radiology/Studies:  No results found.   Assessment  and Plan:   Paroxysmal atrial fibrillation Patient with history of recurrent atrial fibrillation as summarized above.  He was in sinus rhythm when seen in A. fib clinic 11/1 and upon admission.  Went into atrial fibrillation with rapid ventricular rate on 11/12.  This occurred in setting of drug-induced pneumonitis.  Heart rate currently fairly controlled at 80s.  Intermittently goes to 100 with ambulation.  Patient is asymptomatic.  Suspect this will improve with resolution of lung issue. -Xarelto changed to low-dose Eliquis due to interaction itraconazole.  -Continue amiodarone 200 mg daily -Continue Toprol-XL 100 mg daily -Blood pressure soft low but asymptomatic.  This will improve with ambulation. -Will make close follow-up in A. fib clinic. -Discussed importance of anticoagulation.  May consider outpatient cardioversion if still in atrial fibrillation. -Patient is now of itraconazole>>> but needs to keep on low-dose of Eliquis for at least 2 weeks.  Then could increase Eliquis to 5 mg twice daily or change back to Xarelto.  2.  Coronary calcification -Lipitor 10 mg stopped this admission due to interaction with itraconazole however now discontinued. -Resume statin per attending team  Risk Assessment/Risk Scores:   CHA2DS2-VASc Score = 3  This indicates a 3.2% annual risk of stroke. The patient's score is based upon: CHF History: 0 HTN History: 0 Diabetes History: 0 Stroke History: 2 (possible spenic embolic event) Vascular Disease History: 1 Age Score: 0 Gender Score: 0   For questions or updates, please contact [place] Please consult [email or web address] for contact info under    Jarrett Soho, PA  12/30/2020 11:01 AM   Patient seen and examined and agree with Robbie Lis, PA as detailed above.  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  Patient has known history of Afib s/p DCCV in the past. Likely developed recurrent Afib with RVR on admission in the setting of pneumonitis as well as steroid use. Metop has been uptitrated to 100mg  daily with fairly improved rate control. Continued on amiodarone. Further up-titration of metop limited by soft pressures.  Given that patient is still recovering from pneumonitis and remains on steroids, it is unlikely that he will maintain NSR after DCCV. Also do not want to increase his amiodarone further at this time in the setting of active pneumonitis. Now that HR are fairly well controlled, will just monitor on current regimen for now and arrange for follow-up in Afib clinic after discharge. If needed, can consider digoxin for further rate control vs increasing amio.   Notably, apixaban is dose adjusted for use of itraconazole. Will need to go back to 5mg  BID once course is completed.  GEN: No acute distress. Supplemental O2 in place. Speaking in full sentence  Neck: No JVD Cardiac: Irregular, no murmurs Respiratory: Faint expiratory wheezing. Good air movement GI: Soft, nontender, non-distended  MS: No edema; No deformity. Neuro:  Nonfocal  Psych: Normal affect    Plan: -Continue metop 100mg  XL and amiodarone 200mg  daily for now -No plans for DCCV at this time as patient is stable and is unlikely to maintain NSR while active pneumonitis and on high dose steroid  -Can consider  dig vs increasing amiodarone (although worry about doing this with active pneumonitis) if additional rate control needed and no blood pressure room for BB uptitration -Continue dose adjusted apixaban 2.5mg  BID; increase back to 5mg  BID once completed course of  itraconazole -Will arrange for Afib clinic follow-up  Plan discussed with the patient and his wife (on the phone)  Gwyndolyn Kaufman, MD

## 2020-12-30 NOTE — Progress Notes (Signed)
PROGRESS NOTE    John [name]  [name] DOB: [birth year] DOA: 12/22/2020 PCP: [name], MD    Brief Narrative:  John [name] is a 60 year old male with past medical history significant for stage 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 who just completed 11th cycle of Keytruda, paroxysmal atrial fibrillation who presented to [place] ED on 11/7 with progressive shortness of breath x1 week.  Associated with increased productive cough with white sputum and sweats/malaise/fatigue.  Recently seen in outpatient pulmonology office and prescribed Augmentin and prednisone with supple oxygen.  Patient's shortness of breath progressed on 6 L nasal cannula which prompted evaluation in the ED.  In the ED, patient requiring 15 L NRB, chest x-ray with diffuse bilateral heterogeneous opacities.  CTA chest without PE but findings consistent with progressive pneumonitis.  Lactic acid 2.0.  WBC 1.9.  ABG with pH 7.39, PCO2 46, PO2 26.  Patient was given IV Solu-Medrol, vancomycin, cefepime, azithromycin, Xopenex nebs.  Hospitalist service consulted for further evaluation and management of acute hypoxic respiratory failure secondary to commune acquired pneumonia versus drug-induced pneumonitis.   Assessment & Plan:   Principal Problem:   Drug-induced pneumonitis Active Problems:   COPD with asthma ([place])   Stage 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 (HCC)   Hyponatremia   PAF (paroxysmal atrial fibrillation) (HCC)   Severe sepsis (HCC)   Acute on chronic respiratory failure with hypoxia (HCC)   Leukocytopenia   Acute hypoxic respiratory failure, POA Severe sepsis, POA Drug-induced pneumonitis vs community-acquired pneumonia Patient presenting to the ED with progressive shortness of breath despite outpatient treatment by pulmonology with Augmentin, prednisone and started on supplemental oxygen; which he is not on oxygen at baseline.  Chest x-ray with diffuse bilateral heterogeneous airspace  opacities and CT angiogram chest negative for PE with worsening aeration of lungs bilaterally concerning for progressively worsening pneumonitis likely secondary to immunotherapy treatment with Keytruda.  Required 15 L NRB on ED presentation.  Met criteria on admission with leukopenia, tachycardia, tachycardia and hypotension that was responsive to IV fluid hydration.  PCCM signed off 11/10, outpatient follow-up 2 weeks (apt on 11/28 and 12/12) --Oncology following, appreciate assistance --Prednisone 80 mg p.o. daily; taper to 60 mg p.o. daily tomorrow with plan 6 wk slow taper on discharge (decrease 55m/wk) --Anoro Ellipta 1 puff daily (will need LAMA/LABA on dc per PCCM) --Xopenex nebs q6h while awake and PRN --Continue supplemental oxygen, maintain SPO2 greater than 88%, on 2 L [place]  --Ambulatory O2 screen today  Leukopenia: Improved Etiology likely secondary to chemotherapy. --Continue prophylactic antiviral acyclovir --CBC daily  Hyponatremia Sodium 129 on admission, likely secondary to poor oral intake.  Improved. --Na 129>>132>134  Paroxysmal atrial fibrillation w/ RVR Follows with cardiology outpatient, Dr. TRadford Pax  Has required TEE/DCCV on 10/13/2020.  Seen by cardiology on 1115 with no plan for DCCV during hospitalization as likely exacerbated by his pneumonitis and prednisone use. --Amiodarone 2040mPO daily --Increased metoprolol succinate to 100 mg p.o. daily --Eliquis 2.5 mg PO BID (on xarelto at home but changed to Eliquis due to incompatibility with itraconazole) will need to be on eliquis for 2 weeks prior to switching back to xarelto. --Continue monitor on telemetry; outpatient follow-up with cardiology, Afib clinic  Orthostatic hypotension Noted to be orthostatic this morning, will give 1 L IV fluid bolus. --Repeat orthostatic vital signs in the a.m.  Stage IV squamous cell carcinoma right lung --Further per oncology.  COPD --Started on Anoro during hospitalization.   Pulmonology recommends  LAMA/LABA on discharge (Stioloto vs Anoro vs Bevespri) --continue oxygen as above  HLD: Atorvastatin 10 mg p.o. daily  Depression/anxiety: Paxil 10 mg p.o. daily  GERD: Continue PPI   DVT prophylaxis: apixaban (ELIQUIS) tablet 2.5 mg Start: 12/23/20 2200 apixaban (ELIQUIS) tablet 2.5 mg    Code Status: Full Code Family Communication: No family present at bedside this morning Disposition Plan:  Level of care: Med-Surg Status is: Inpatient  Remains inpatient appropriate because: Continue to wean oxygen, transitioned to oral steroids, need to ensure respiratory stability prior to discharge home; now with A. fib with RVR that has improved with increased dose of metoprolol succinate.  Anticipate discharge home on prolonged steroid taper in 1-2 days.    Consultants:  PCCM, Dr. Carlis Abbott - signed off 11/10 Medical oncology, [name]  Procedures:  None  Antimicrobials:  Azithromycin 11/7 - 11/10 Cefepime 11/7 - 11/10 Itraconazole 11/8 - 11/10 Acyclovir 11/8>> Vancomycin 11/7 -11/7    Subjective: Patient seen examined at bedside, resting comfortably in bed.  Patient reports some dizziness when ambulating to the bathroom this morning.  Patient's heart rate remains intermittently tachycardic when he exerts himself.  Seen by cardiology this morning, no indication for DCCV at this time.  Recommend continuing rate control with increased metoprolol dose.  Orthostatic this morning, currently off of oxygen at rest with SPO2 93%, but likely will need with exertion.   Patient continues to feel weak and fatigued.  No other complaints or concerns at this time.  Denies headache, no visual changes, no fever/chills/night sweats, no nausea/vomiting/diarrhea, no chest pain, no palpitations, no abdominal pain, no weakness, no fatigue, no paresthesias.  No acute events overnight per nursing staff.  Objective: Vitals:   12/30/20 0900 12/30/20 0903 12/30/20 0940 12/30/20 1300  BP:  (!) 89/60  98/70 104/78  Pulse:   98 98  Resp:      Temp:    98.7 F (37.1 C)  TempSrc:      SpO2:  97%    Weight:      Height:        Intake/Output Summary (Last 24 hours) at 12/30/2020 1408 Last data filed at 12/30/2020 0440 Gross per 24 hour  Intake 240 ml  Output 775 ml  Net -535 ml   Filed Weights   12/22/20 0930 12/23/20 0400 12/25/20 1719  Weight: 93 kg 91.1 kg 93.9 kg    Examination:  General exam: Appears calm and comfortable  Respiratory system: Mild late expiratory wheezing bilateral lung fields, no crackles, normal respiratory effort without accessory muscle use, on 2 L nasal cannula with SPO2 92% at rest Cardiovascular system: S1 & S2 heard, irregularly irregular rhythm, tachycardic. No JVD, murmurs, rubs, gallops or clicks. No pedal edema. Gastrointestinal system: Abdomen is nondistended, soft and nontender. No organomegaly or masses felt. Normal bowel sounds heard. Central nervous system: Alert and oriented. No focal neurological deficits. Extremities: Symmetric 5 x 5 power. Skin: No rashes, lesions or ulcers Psychiatry: Judgement and insight appear normal. Mood & affect appropriate.     Data Reviewed: I have personally reviewed following labs and imaging studies  CBC: Recent Labs  Lab 12/24/20 0236 12/25/20 0235 12/26/20 0341 12/27/20 0432 12/29/20 0343  WBC 1.8* 1.9* 2.2* 2.4* 2.6*  NEUTROABS 1.3* 1.4* 1.5* 1.6*  --   HGB 9.7* 9.8* 10.1* 10.4* 12.6*  HCT 29.1* 30.0* 30.2* 30.6* 38.0*  MCV 85.6 87.2 85.8 84.8 86.6  PLT 307 314 311 294 115   Basic Metabolic Panel: Recent Labs  Lab 12/25/20 0235 12/26/20 0341 12/27/20 0432 12/29/20 0343 12/30/20 1156  NA 134* 134* 134* 135 132*  K 3.9 4.1 3.7 3.4* 4.1  CL 103 102 104 103 102  CO2 '25 27 27 27 26  ' GLUCOSE 183* 167* 135* 166* 97  BUN '14 17 18 19 ' 23*  CREATININE 0.78 0.86 0.66 0.86 0.76  CALCIUM 8.3* 8.4* 8.3* 7.9* 8.2*  MG  --   --   --  2.1 1.9   GFR: Estimated Creatinine Clearance:  116.3 mL/min (by C-G formula based on SCr of 0.76 mg/dL). Liver Function Tests: Recent Labs  Lab 12/24/20 0236 12/25/20 0235 12/26/20 0341 12/27/20 0432  AST 28 27 35 31  ALT 18 26 46* 53*  ALKPHOS 49 46 48 46  BILITOT 0.5 0.5 0.4 0.5  PROT 5.7* 5.6* 5.6* 5.2*  ALBUMIN 2.3* 2.3* 2.4* 2.3*   No results for input(s): LIPASE, AMYLASE in the last 168 hours. No results for input(s): AMMONIA in the last 168 hours. Coagulation Profile: No results for input(s): INR, PROTIME in the last 168 hours.  Cardiac Enzymes: No results for input(s): CKTOTAL, CKMB, CKMBINDEX, TROPONINI in the last 168 hours. BNP (last 3 results) No results for input(s): PROBNP in the last 8760 hours. HbA1C: No results for input(s): HGBA1C in the last 72 hours. CBG: No results for input(s): GLUCAP in the last 168 hours. Lipid Profile: No results for input(s): CHOL, HDL, LDLCALC, TRIG, CHOLHDL, LDLDIRECT in the last 72 hours. Thyroid Function Tests: No results for input(s): TSH, T4TOTAL, FREET4, T3FREE, THYROIDAB in the last 72 hours. Anemia Panel: No results for input(s): VITAMINB12, FOLATE, FERRITIN, TIBC, IRON, RETICCTPCT in the last 72 hours. Sepsis Labs: Recent Labs  Lab 12/24/20 0236  PROCALCITON 0.20    Recent Results (from the past 240 hour(s))  Blood Culture (routine x 2)     Status: None   Collection Time: 12/22/20 10:03 AM   Specimen: BLOOD  Result Value Ref Range Status   Specimen Description   Final    BLOOD BLOOD RIGHT FOREARM Performed at [facility] [address], [place], [place] [address]    Special Requests   Final    BOTTLES DRAWN AEROBIC ONLY Blood Culture adequate volume Performed at [facility] [address] [place], [place] [address]    Culture   Final    NO GROWTH 5 DAYS Performed at [facility], [place] [address] [place], [place] [address]    Report Status 12/27/2020 FINAL  Final  Blood Culture (routine x 2)     Status:  None   Collection Time: 12/22/20 10:08 AM   Specimen: BLOOD  Result Value Ref Range Status   Specimen Description   Final    BLOOD LEFT ANTECUBITAL Performed at [facility] [address], [place], [place] [address]    Special Requests   Final    BOTTLES DRAWN AEROBIC AND ANAEROBIC Blood Culture adequate volume Performed at [facility] [address], [place], [place] [address]    Culture   Final    NO GROWTH 5 DAYS Performed at [facility], [place] [address] [place], [place] [address]    Report Status 12/27/2020 FINAL  Final  MRSA Next Gen by PCR, Nasal     Status: None   Collection Time: 12/22/20 10:08 AM   Specimen: Nasopharyngeal Swab; Nasal Swab  Result Value Ref Range Status   MRSA by PCR Next Gen NOT DETECTED  NOT DETECTED Final    Comment: (NOTE) The GeneXpert MRSA Assay (FDA approved for NASAL specimens only), is one component of a comprehensive MRSA colonization surveillance program. It is not intended to diagnose MRSA infection nor to guide or monitor treatment for MRSA infections. Test performance is not FDA approved in patients less than 44 years old. Performed at [facility], [place] [address], [place], [place] [address]   Resp Panel by RT-PCR (Flu A&B, Covid) Nasopharyngeal Swab     Status: None   Collection Time: 12/22/20 10:27 AM   Specimen: Nasopharyngeal Swab; Nasopharyngeal(NP) swabs in vial transport medium  Result Value Ref Range Status   SARS Coronavirus 2 by RT PCR NEGATIVE NEGATIVE Final    Comment: (NOTE) SARS-CoV-2 target nucleic acids are NOT DETECTED.  The SARS-CoV-2 RNA is generally detectable in upper respiratory specimens during the acute phase of infection. The lowest concentration of SARS-CoV-2 viral copies this assay can detect is 138 copies/mL. A negative result does not preclude SARS-Cov-2 infection and should not be used as the sole basis for treatment or other patient  management decisions. A negative result may occur with  improper specimen collection/handling, submission of specimen other than nasopharyngeal swab, presence of viral mutation(s) within the areas targeted by this assay, and inadequate number of viral copies(<138 copies/mL). A negative result must be combined with clinical observations, patient history, and epidemiological information. The expected result is Negative.  Fact Sheet for Patients:  EntrepreneurPulse.com.au  Fact Sheet for Healthcare Providers:  IncredibleEmployment.be  This test is no t yet approved or cleared by the [place] FDA and  has been authorized for detection and/or diagnosis of SARS-CoV-2 by FDA under an Emergency Use Authorization (EUA). This EUA will remain  in effect (meaning this test can be used) for the duration of the COVID-19 declaration under Section 564(b)(1) of the Act, 21 U.S.C.section 360bbb-3(b)(1), unless the authorization is terminated  or revoked sooner.       Influenza A by PCR NEGATIVE NEGATIVE Final   Influenza B by PCR NEGATIVE NEGATIVE Final    Comment: (NOTE) The Xpert Xpress SARS-CoV-2/FLU/RSV plus assay is intended as an aid in the diagnosis of influenza from Nasopharyngeal swab specimens and should not be used as a sole basis for treatment. Nasal washings and aspirates are unacceptable for Xpert Xpress SARS-CoV-2/FLU/RSV testing.  Fact Sheet for Patients: EntrepreneurPulse.com.au  Fact Sheet for Healthcare Providers: IncredibleEmployment.be  This test is not yet approved or cleared by the [place] FDA and has been authorized for detection and/or diagnosis of SARS-CoV-2 by FDA under an Emergency Use Authorization (EUA). This EUA will remain in effect (meaning this test can be used) for the duration of the COVID-19 declaration under Section 564(b)(1) of the Act, 21 U.S.C. section 360bbb-3(b)(1),  unless the authorization is terminated or revoked.  Performed at [facility], [place] [address] [place], [place] [address]   Urine Culture     Status: None   Collection Time: 12/22/20 12:32 PM   Specimen: In/Out Cath Urine  Result Value Ref Range Status   Specimen Description   Final    IN/OUT CATH URINE Performed at [facility], [place] [address], [place], [place] [address]    Special Requests   Final    NONE Performed at [facility], [place] [address], [place], [place] [address]    Culture   Final    NO GROWTH Performed at [facility], [place],  [place] [address]    Report Status 12/23/2020 FINAL  Final  Respiratory (~20 pathogens) panel by PCR     Status: None   Collection Time: 12/22/20  1:47 PM   Specimen: Nasopharyngeal Swab; Respiratory  Result Value Ref Range Status   Adenovirus NOT DETECTED NOT DETECTED Final   Coronavirus 229E NOT DETECTED NOT DETECTED Final    Comment: (NOTE) The Coronavirus on the Respiratory Panel, DOES NOT test for the novel  Coronavirus (2019 nCoV)    Coronavirus HKU1 NOT DETECTED NOT DETECTED Final   Coronavirus NL63 NOT DETECTED NOT DETECTED Final   Coronavirus OC43 NOT DETECTED NOT DETECTED Final   Metapneumovirus NOT DETECTED NOT DETECTED Final   Rhinovirus / Enterovirus NOT DETECTED NOT DETECTED Final   Influenza A NOT DETECTED NOT DETECTED Final   Influenza B NOT DETECTED NOT DETECTED Final   Parainfluenza Virus 1 NOT DETECTED NOT DETECTED Final   Parainfluenza Virus 2 NOT DETECTED NOT DETECTED Final   Parainfluenza Virus 3 NOT DETECTED NOT DETECTED Final   Parainfluenza Virus 4 NOT DETECTED NOT DETECTED Final   Respiratory Syncytial Virus NOT DETECTED NOT DETECTED Final   Bordetella pertussis NOT DETECTED NOT DETECTED Final   Bordetella Parapertussis NOT DETECTED NOT DETECTED Final   Chlamydophila pneumoniae NOT DETECTED NOT DETECTED Final   Mycoplasma  pneumoniae NOT DETECTED NOT DETECTED Final    Comment: Performed at [facility], [place]. [address] [place], [place] [address]         Radiology Studies: DG Chest 2 View  Result Date: 12/30/2020 CLINICAL DATA:  Shortness of breath.  Pneumonia. EXAM: CHEST - 2 VIEW COMPARISON:  Chest x-ray dated December 26, 2020. FINDINGS: Normal heart size. New pneumomediastinum and right chest wall and lower neck subcutaneous emphysema. No pneumothorax. Diffuse interstitial opacity in both lungs is not significantly changed. Similar cavitation in the right mid lung. No pleural effusion. No acute osseous abnormality. IMPRESSION: 1. New pneumomediastinum and right chest wall and lower neck subcutaneous emphysema. No pneumothorax. 2. Unchanged diffuse interstitial opacities in both lungs which could reflect pneumonitis or multifocal pneumonia. Electronically Signed   By: Titus Dubin M.D.   On: 12/30/2020 12:49        Scheduled Meds:  acyclovir  800 mg Oral BID   amiodarone  200 mg Oral QPM   apixaban  2.5 mg Oral BID   ipratropium  0.5 mg Nebulization BID   levalbuterol  0.63 mg Nebulization BID   mouth rinse  15 mL Mouth Rinse BID   metoprolol succinate  100 mg Oral Daily   pantoprazole  40 mg Oral QHS   PARoxetine  10 mg Oral Daily   [START ON 12/31/2020] predniSONE  60 mg Oral Q breakfast   umeclidinium-vilanterol  1 puff Inhalation Daily   Continuous Infusions:  sodium chloride        LOS: 7 days    Time spent: 39 minutes spent on chart review, discussion with nursing staff, consultants, updating family and interview/physical exam; more than 50% of that time was spent in counseling and/or coordination of care.    Rudie Rikard J [place] DO [facility] Available via Epic secure chat 7am-7pm After these hours, please refer to coverage provider listed on [email or web address] 12/30/2020, 2:08 PM

## 2020-12-30 NOTE — Progress Notes (Addendum)
HEMATOLOGY-ONCOLOGY PROGRESS NOTE  SUBJECTIVE: Breathing remains stable.  John [name] has had some dizziness and lightheadedness this morning.  Oncology History  Stage 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 ([place])  04/10/2020 Initial Diagnosis   Stage 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 ([place])   04/10/2020 Cancer Staging   Staging form: Lung, AJCC 8th Edition - Clinical: Stage IVB (cT4, cN2, cM1c) - Signed by Curt Bears, MD on 04/10/2020    05/05/2020 -  Chemotherapy   Patient is on Treatment Plan : LUNG NSCLC Carboplatin + Paclitaxel + Pembrolizumab q21d x 4 cycles / Pembrolizumab Maintenance Q21D      PHYSICAL EXAMINATION:  Vitals:   12/29/20 2105 12/30/20 0436  BP:  104/73  Pulse: 95 78  Resp:  18  Temp:  97.6 F (36.4 C)  SpO2:  94%   Filed Weights   12/22/20 0930 12/23/20 0400 12/25/20 1719  Weight: 93 kg 91.1 kg 93.9 kg    Intake/Output from previous day: 11/14 0701 - 11/15 0700 In: 240 [P.O.:240] Out: 775 [Urine:775]  GENERAL:alert, no distress and comfortable SKIN: skin color, texture, turgor are normal, no rashes or significant lesions EYES: normal, Conjunctiva are pink and non-injected, sclera clear OROPHARYNX:no exudate, no erythema and lips, buccal mucosa, and tongue normal  LUNGS: Faint wheezing HEART: Irregularly irregular, no lower extremity edema ABDOMEN:abdomen soft, non-tender and normal bowel sounds  NEURO: alert & oriented x 3 with fluent speech, no focal motor/sensory deficits  LABORATORY DATA:  I have reviewed the data as listed CMP Latest Ref Rng & Units 12/29/2020 12/27/2020 12/26/2020  Glucose 70 - 99 mg/dL 166(H) 135(H) 167(H)  BUN 6 - 20 mg/dL 19 18 17   Creatinine 0.61 - 1.24 mg/dL 0.86 0.66 0.86  Sodium 135 - 145 mmol/L 135 134(L) 134(L)  Potassium 3.5 - 5.1 mmol/L 3.4(L) 3.7 4.1  Chloride 98 - 111 mmol/L 103 104 102  CO2 22 - 32 mmol/L 27 27 27   Calcium 8.9 - 10.3 mg/dL 7.9(L) 8.3(L) 8.4(L)  Total Protein 6.5 - 8.1 g/dL - 5.2(L)  5.6(L)  Total Bilirubin 0.3 - 1.2 mg/dL - 0.5 0.4  Alkaline Phos 38 - 126 U/L - 46 48  AST 15 - 41 U/L - 31 35  ALT 0 - 44 U/L - 53(H) 46(H)    Lab Results  Component Value Date   WBC 2.6 (L) 12/29/2020   HGB 12.6 (L) 12/29/2020   HCT 38.0 (L) 12/29/2020   MCV 86.6 12/29/2020   PLT 262 12/29/2020   NEUTROABS 1.6 (L) 12/27/2020    DG Chest 2 View  Result Date: 12/18/2020 CLINICAL DATA:  Stage IV lung cancer. EXAM: CHEST - 2 VIEW COMPARISON:  Radiograph 10/10/2020, CT 822 22 FINDINGS: Normal cardiac silhouette. New streaky airspace consolidation in LEFT lung. Similar dense consolidative pattern in the RIGHT lung with central cavitation measuring 5.5 cm. There is some improvement density within the RIGHT lung about the cavitation. low lung volumes. No acute osseous abnormality. IMPRESSION: 1. New streaky airspace densities in the LEFT lung concerning for multifocal pneumonia versus lung cancer recurrence. 2. Persistent cavitation in the RIGHT mid lung. Some improvement in airspace density about the cavitation. These results will be called to the ordering clinician or representative by the Radiologist Assistant, and communication documented in the PACS or [employer]. Electronically Signed   By: Suzy Bouchard M.D.   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 Electronically Signed   By: Vinnie Langton M.D.   On: 12/22/2020 11:39   Result Date: 12/22/2020 CLINICAL DATA:  60 year old male with history of shortness of breath for 1 week. Evaluate for pulmonary embolism. EXAM: CT ANGIOGRAPHY CHEST WITH CONTRAST TECHNIQUE: Multidetector CT imaging of the chest was performed using the standard protocol during bolus administration of intravenous contrast.  Multiplanar CT image reconstructions and MIPs were obtained to evaluate the vascular anatomy. CONTRAST:  96mL OMNIPAQUE IOHEXOL 350 MG/ML SOLN COMPARISON:  Chest CT 10/06/2020. FINDINGS: Cardiovascular: Study is severely limited by a large amount of patient respiratory motion. With these limitations in mind, there is no central, lobar or proximal segmental sized filling defect to clearly indicate the presence of clinically significant pulmonary embolism. Smaller distal segmental and subsegmental sized pulmonary emboli cannot be entirely excluded. Heart size is normal. There is no significant pericardial fluid, thickening or pericardial calcification. There is aortic atherosclerosis, as well as atherosclerosis of the great vessels of the mediastinum and the coronary arteries, including calcified atherosclerotic plaque in the left anterior descending and right coronary arteries. Mediastinum/Nodes: Previously noted prominent but nonenlarged mediastinal lymph nodes appear stable compared to the prior examination. No definite pathologically enlarged mediastinal or hilar lymph nodes are noted. Esophagus is unremarkable in appearance. No axillary lymphadenopathy. Lungs/Pleura: Dense areas of airspace consolidation persist in the right upper and lower lobes. Interval cavitation of several areas of previously noted airspace consolidation in the right upper lobe posteriorly and in the superior segment of the right lower lobe. Today's study is most notable for widespread areas of ground-glass attenuation, septal thickening and regional areas of architectural distortion which have developed elsewhere in the lungs bilaterally, most evident throughout the mid to upper lungs, concerning for progressive pneumonitis. No definite new suspicious appearing pulmonary nodules or masses are noted. Mild diffuse bronchial wall thickening with mild centrilobular and paraseptal emphysema. Upper Abdomen: Large right adrenal mass increased  compared to the prior study, currently measuring 6.4 x 3.7 cm, compatible with a large metastatic lesion. Spleen is incompletely imaged, but appears likely enlarged measuring at least 15.1 x 8.6 cm on axial images. Peripherally calcified gallstone in the neck of the gallbladder measuring 1.8 cm in diameter. Musculoskeletal: There are no aggressive appearing lytic or blastic lesions noted in the visualized portions of the skeleton. Review of the MIP images confirms the above findings. IMPRESSION: 1. Limited study demonstrating no definite evidence of central, lobar or proximal segmental sized pulmonary embolism. Distal segmental and subsegmental sized emboli are not entirely excluded. 2. Worsening aeration in the lungs bilaterally, the appearance of which is concerning for progressively worsening pneumonitis as can be seen in the setting of immunotherapy (patient is reportedly on Keytruda). Further clinical evaluation is recommended. Severe multilobar bilateral pneumonia could be considered, however, lack of response to antibiotics makes this less likely unless the causative agent is not being effectively treated. 3. Aortic atherosclerosis, in addition to two vessel coronary artery disease. Please note that although the presence of coronary artery calcium documents the presence of coronary artery disease, the severity of this disease and any potential stenosis cannot be assessed on this non-gated CT examination. Assessment for potential risk factor modification, dietary therapy or pharmacologic therapy may be warranted, if clinically indicated. 4. Mild diffuse bronchial wall thickening with mild centrilobular and paraseptal emphysema; imaging findings suggestive of underlying COPD.  These results will be called to the ordering clinician or representative by the Radiologist Assistant, and communication documented in the PACS or [employer]. Aortic Atherosclerosis (ICD10-I70.0) and Emphysema (ICD10-J43.9).  Electronically Signed: By: Vinnie Langton M.D. On: 12/22/2020 11:27   DG CHEST PORT 1 VIEW  Result Date: 12/26/2020 CLINICAL DATA:  Small cell lung cancer, possible pneumonitis EXAM: PORTABLE CHEST 1 VIEW COMPARISON:  Prior chest x-ray 12/24/2020 FINDINGS: Stable cardiac and mediastinal contours. No significant interval change in the appearance of the chest. Extensive interstitial opacities bilaterally more general throughout the left lung and more focal on the right in the mid and upper lung. No new airspace infiltrate, pleural effusion or pneumothorax. Bullous changes in the right mid lung again noted. IMPRESSION: Stable appearance of the chest. No significant interval change compared to 12/24/2020. Persistent extensive interstitial airspace opacities throughout the left lung and primarily in the right upper lung. Electronically Signed   By: Jacqulynn Cadet M.D.   On: 12/26/2020 06:31   DG CHEST PORT 1 VIEW  Result Date: 12/24/2020 CLINICAL DATA:  Pneumonitis, assess for changes. EXAM: PORTABLE CHEST 1 VIEW COMPARISON:  December 22, 2020. FINDINGS: Trachea midline. Cardiomediastinal contours and hilar structures are stable. Persistent interstitial and alveolar opacities superimposed on cystic changes in the RIGHT chest. Stable appearance of the chest since previous imaging. On limited assessment no acute skeletal process. IMPRESSION: Stable low lung volumes with diffuse interstitial and alveolar opacities with cystic and or cavitary changes in the RIGHT chest. Electronically Signed   By: Zetta Bills M.D.   On: 12/24/2020 09:57   DG Chest Port 1 View  Result Date: 12/22/2020 CLINICAL DATA:  Shortness of breath, hypoxia EXAM: PORTABLE CHEST 1 VIEW COMPARISON:  12/18/2020 FINDINGS: No significant change in AP portable chest radiographs with diffuse bilateral interstitial heterogeneous airspace opacity, as well as a cavitary lesion of the right hand. No new airspace opacity. The heart and  mediastinum are unremarkable. IMPRESSION: No significant change in AP portable chest radiographs with diffuse bilateral interstitial heterogeneous airspace opacity, as well as a cavitary lesion of the right hand. No new airspace opacity. Electronically Signed   By: Delanna Ahmadi M.D.   On: 12/22/2020 10:22    ASSESSMENT AND PLAN: 1.  Metastatic squamous cell carcinoma of the right upper lung 2.  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  Paroxysmal atrial fibrillation 4.  Neutropenia 5.  Mild anemia 6.  COPD 7.  Hyperlipidemia 8.  Depression/anxiety 9.  GERD  John [name] appears stable.  John [name] is receiving high-dose prednisone due to drug-induced pneumonitis from [facility].  Plan is for a very slow taper of prednisone.  John [name] is currently taking 80 mg daily and we will plan to decrease to 60 mg daily starting tomorrow morning. John [name] will have further discussions with [name] following hospital discharge regarding treatment for his metastatic lung cancer.  The patient has neutropenia and has a longstanding history of this dating back to at least February 2022 and even predates his cancer diagnosis.  His WBC remained stable and John [name] is afebrile.  We will plan to continue to observe.  John [name] has atrial fibrillation with RVR.  Discussed with hospitalist who is planning for cardiology consultation today.  Recommendations: 1.  We will decrease prednisone to 60 mg daily starting 11/16.  John [name] will need to continue steroids upon discharge.  We will further titrate after John [name] is discharged. 2.  John [name] will have further discussions with [name] regarding treatment for his lung cancer. 3.  Cardiology consultation is being ordered per hospitalist for A. fib. 4.  From our standpoint, the patient may be discharged to home if otherwise medically stable.  John [name] already has outpatient follow-up scheduled with [name] on 11/23.  Future Appointments  Date Time Provider [place]  01/07/2021  8:30 AM [facility]  [facility] None  01/07/2021  9:00 AM Ennever, Rudell Cobb, MD [facility] None  01/07/2021  9:30 AM [facility] A1 [facility] None  01/12/2021 10:30 AM Martyn Ehrich, NP [facility] None  01/29/2021  3:15 PM [name], MD [facility] None  02/19/2021  7:45 AM [facility]  04/27/2021  9:00 AM Malka So R, PA [facility] None  05/15/2021  8:20 AM Sueanne Margarita, MD [facility]      LOS: 7 days   Mikey Bussing, DNP, AGPCNP-BC, AOCNP 12/30/20 John [name] was interviewed and examined.  John [name] appeared comfortable when I saw him early this morning.  John [name] has not been using oxygen consistently.  John [name] reports feeling much better compared to when John [name] was admitted.  We will begin a slow steroid taper.  John [name] can be discharged on prednisone from an oncology standpoint.  John [name] will need better control of the atrial fibrillation prior to discharge.  I was present for greater than 50% of today's visit.  I performed medical decision making.

## 2020-12-31 ENCOUNTER — Inpatient Hospital Stay (HOSPITAL_COMMUNITY): Payer: 59

## 2020-12-31 LAB — BASIC METABOLIC PANEL
Anion gap: 4 — ABNORMAL LOW (ref 5–15)
BUN: 21 mg/dL — ABNORMAL HIGH (ref 6–20)
CO2: 27 mmol/L (ref 22–32)
Calcium: 8.1 mg/dL — ABNORMAL LOW (ref 8.9–10.3)
Chloride: 104 mmol/L (ref 98–111)
Creatinine, Ser: 0.91 mg/dL (ref 0.61–1.24)
GFR, Estimated: >60 mL/min (ref >60)
Glucose, Bld: 113 mg/dL — ABNORMAL HIGH (ref 70–99)
Potassium: 3.9 mmol/L (ref 3.5–5.1)
Sodium: 135 mmol/L (ref 135–145)

## 2020-12-31 LAB — CBC
HCT: 37.6 % — ABNORMAL LOW (ref 39.0–52.0)
Hemoglobin: 12.5 g/dL — ABNORMAL LOW (ref 13.0–17.0)
MCH: 28.4 pg (ref 26.0–34.0)
MCHC: 33.2 g/dL (ref 30.0–36.0)
MCV: 85.5 fL (ref 80.0–100.0)
Platelets: 208 10*3/uL (ref 150–400)
RBC: 4.4 MIL/uL (ref 4.22–5.81)
RDW: 14.7 % (ref 11.5–15.5)
WBC: 3 10*3/uL — ABNORMAL LOW (ref 4.0–10.5)
nRBC: 0 % (ref 0.0–0.2)

## 2020-12-31 LAB — MAGNESIUM: Magnesium: 1.9 mg/dL (ref 1.7–2.4)

## 2020-12-31 MED ORDER — ATORVASTATIN CALCIUM 10 MG PO TABS
10.0000 mg | ORAL_TABLET | Freq: Every day | ORAL | Status: DC
  Filled 2020-12-31 (×2): qty 1

## 2020-12-31 NOTE — TOC Progression Note (Signed)
Transition of Care [facility]) - Progression Note    Patient Details  Name: John [name] MRN: [id number] Date of Birth: [birth year]  Transition of Care [facility]) CM/SW Contact  Leeroy Cha, RN Phone Number: 12/31/2020, 9:43 AM  Clinical Narrative:    Acute hypoxic respiratory failure, POA Severe sepsis, POA Drug-induced pneumonitis vs community-acquired pneumonia Patient presenting to the ED with progressive shortness of breath despite outpatient treatment by pulmonology with Augmentin, prednisone and started on supplemental oxygen; which he is not on oxygen at baseline.  Chest x-ray with diffuse bilateral heterogeneous airspace opacities and CT angiogram chest negative for PE with worsening aeration of lungs bilaterally concerning for progressively worsening pneumonitis likely secondary to immunotherapy treatment with Keytruda.  Required 15 L NRB on ED presentation.  Met criteria on admission with leukopenia, tachycardia, tachycardia and hypotension that was responsive to IV fluid hydration.  PCCM signed off 11/10, outpatient follow-up 2 weeks (apt on 11/28 and 12/12) --Oncology following, appreciate assistance --Prednisone 80 mg p.o. daily; taper to 60 mg p.o. daily tomorrow with plan 6 wk slow taper on discharge (decrease 50m/wk) --Anoro Ellipta 1 puff daily (will need LAMA/LABA on dc per PCCM) --Xopenex nebs q6h while awake and PRN --Continue supplemental oxygen, maintain SPO2 greater than 88%, on 2 L [place]  --Ambulatory O2 screen today   Leukopenia: Improved Etiology likely secondary to chemotherapy. --Continue prophylactic antiviral acyclovir --CBC daily   Hyponatremia Sodium 129 on admission, likely secondary to poor oral intake.  Improved. --Na 129>>132>134   Paroxysmal atrial fibrillation w/ RVR Follows with cardiology outpatient, Dr. TRadford Pax  Has required TEE/DCCV on 10/13/2020.  Seen by cardiology on 1115 with no plan for DCCV during hospitalization as likely exacerbated by his  pneumonitis and prednisone use. --Amiodarone 2057mPO daily --Increased metoprolol succinate to 100 mg p.o. daily --Eliquis 2.5 mg PO BID (on xarelto at home but changed to Eliquis due to incompatibility with itraconazole) will need to be on eliquis for 2 weeks prior to switching back to xarelto. --Continue monitor on telemetry; outpatient follow-up with cardiology, Afib clinic   Orthostatic hypotension Noted to be orthostatic this morning, will give 1 L IV fluid bolus. --Repeat orthostatic vital signs in the a.m.   Stage IV squamous cell carcinoma right lung --Further per oncology.   COPD --Started on Anoro during hospitalization.  Pulmonology recommends LAMA/LABA on discharge (Stioloto vs Anoro vs Bevespri) --continue oxygen as above  TOC PLAN OF CARE: Following for dc home needs Following for [progression.     Barriers to Discharge: Continued Medical Work up  Expected Discharge Plan and Services                                                 Social Determinants of Health (SDOH) Interventions    Readmission Risk Interventions No flowsheet data found.

## 2020-12-31 NOTE — Progress Notes (Signed)
PROGRESS NOTE    John [name]  FOY:[id number] DOB: [birth year] DOA: 12/22/2020 PCP: [name], MD    Brief Narrative:  John [name] is a 60 year old male with past medical history significant for stage 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 who just completed 11th cycle of Keytruda, paroxysmal atrial fibrillation who presented to [facility] ED on 11/7 with progressive shortness of breath x1 week.  Associated with increased productive cough with white sputum and sweats/malaise/fatigue.  Recently seen in outpatient pulmonology office and prescribed Augmentin and prednisone with supple oxygen.  Patient's shortness of breath progressed on 6 L nasal cannula which prompted evaluation in the ED.  In the ED, patient requiring 15 L NRB, chest x-ray with diffuse bilateral heterogeneous opacities.  CTA chest without PE but findings consistent with progressive pneumonitis.  Lactic acid 2.0.  WBC 1.9.  ABG with pH 7.39, PCO2 46, PO2 26.  Patient was given IV Solu-Medrol, vancomycin, cefepime, azithromycin, Xopenex nebs.  Hospitalist service consulted for further evaluation and management of acute hypoxic respiratory failure secondary to commune acquired pneumonia versus drug-induced pneumonitis.  12/31/2020: Patient was seen and examined at his bedside.  States his breathing is improved.  Reviewed his last chest x-ray which showed pneumomediastinum and subcutaneous emphysema.  CT scan chest without contrast was obtained to rule out pneumothorax or perforation.  It revealed marked normal mediastinum extending to the neck base soft tissues and right chest wall.  Findings are consistent with barotrauma.  Improved lung opacities and findings consistent with fibrosis affecting right mid to upper lung.  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 .   Assessment & Plan:   Principal Problem:   Drug-induced pneumonitis Active Problems:   COPD with asthma ([facility])   Stage 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 (HCC)    Hyponatremia   PAF (paroxysmal atrial fibrillation) (HCC)   Severe sepsis (HCC)   Acute on chronic respiratory failure with hypoxia (HCC)   Leukocytopenia   Acute hypoxic respiratory failure, POA Severe sepsis, POA Drug-induced pneumonitis vs community-acquired pneumonia Patient presenting to the ED with progressive shortness of breath despite outpatient treatment by pulmonology with Augmentin, prednisone and started on supplemental oxygen; which he is not on oxygen at baseline.  Chest x-ray with diffuse bilateral heterogeneous airspace opacities and CT angiogram chest negative for PE with worsening aeration of lungs bilaterally concerning for progressively worsening pneumonitis likely secondary to immunotherapy treatment with Keytruda.  Required 15 L NRB on ED presentation.  Met criteria on admission with leukopenia, tachycardia, tachycardia and hypotension that was responsive to IV fluid hydration.  PCCM signed off 11/10, outpatient follow-up 2 weeks (apt on 11/28 and 12/12) --Oncology following, appreciate assistance --Prednisone 80 mg p.o. daily; taper to 60 mg p.o. daily tomorrow with plan 6 wk slow taper on discharge (decrease 41m/wk) --Anoro Ellipta 1 puff daily (will need LAMA/LABA on dc per PCCM) --Xopenex nebs q6h while awake and PRN --Continue supplemental oxygen, maintain SPO2 greater than 88%, on 2 L [place]  --Ambulatory O2 screen today  Pneumomediastinum suspect secondary to barotrauma No evidence of pneumothorax or perforation. O2 saturation improving on room air.  Leukopenia: Improved Etiology likely secondary to chemotherapy. --Continue prophylactic antiviral acyclovir --CBC daily  Hyponatremia, improving Sodium 129 on admission, likely secondary to poor oral intake.  Improved. --Na 129>>132>134  Paroxysmal atrial fibrillation w/ RVR Follows with cardiology outpatient, Dr. TRadford Pax  Has required TEE/DCCV on 10/13/2020.  Seen by cardiology on 1115 with no plan for DCCV  during hospitalization as  likely exacerbated by his pneumonitis and prednisone use. --Amiodarone 25m PO daily --Increased metoprolol succinate to 100 mg p.o. daily --Eliquis 2.5 mg PO BID (on xarelto at home but changed to Eliquis due to incompatibility with itraconazole) will need to be on eliquis for 2 weeks prior to switching back to xarelto. --Continue monitor on telemetry; outpatient follow-up with cardiology, Afib clinic  Orthostatic hypotension Noted to be orthostatic this morning, will give 1 L IV fluid bolus. --Repeat orthostatic vital signs in the a.m.  Stage IV squamous cell carcinoma right lung --Further per oncology.  COPD --Started on Anoro during hospitalization.  Pulmonology recommends LAMA/LABA on discharge (Stioloto vs Anoro vs Bevespri) --continue oxygen as above  HLD: Atorvastatin 10 mg p.o. daily  Depression/anxiety: Paxil 10 mg p.o. daily  GERD: Continue PPI  Large right adrenal mass, 5.9 cm This has increased in size from 4.5 cm when compared to a previous CT scan done on 07/18/2020. Will need to follow-up with medical oncology.   DVT prophylaxis: apixaban (ELIQUIS) tablet 2.5 mg Start: 12/23/20 2200 apixaban (ELIQUIS) tablet 2.5 mg    Code Status: Full Code Family Communication: No family present at bedside this morning Disposition Plan:  Level of care: Med-Surg Status is: Inpatient  Remains inpatient appropriate because: Continue to wean oxygen, transitioned to oral steroids, need to ensure respiratory stability prior to discharge home; now with A. fib with RVR that has improved with increased dose of metoprolol succinate.  Anticipate discharge home on prolonged steroid taper in 1-2 days.    Consultants:  PCCM, Dr. CCarlis Abbott- signed off 11/10 Medical oncology, Dr. EMarin Olp Procedures:  None  Antimicrobials:  Azithromycin 11/7 - 11/10 Cefepime 11/7 - 11/10 Itraconazole 11/8 - 11/10 Acyclovir 11/8>> Vancomycin 11/7 -11/7   Objective: Vitals:    12/31/20 0527 12/31/20 0529 12/31/20 0851 12/31/20 1521  BP: 98/67 (!) 83/61  109/75  Pulse: 63 63  85  Resp:    20  Temp:    98.2 F (36.8 C)  TempSrc:    Oral  SpO2:   95% 90%  Weight:      Height:        Intake/Output Summary (Last 24 hours) at 12/31/2020 1806 Last data filed at 12/31/2020 0500 Gross per 24 hour  Intake 240 ml  Output 1100 ml  Net -860 ml   Filed Weights   12/22/20 0930 12/23/20 0400 12/25/20 1719  Weight: 93 kg 91.1 kg 93.9 kg    Examination:  General exam: Well-developed well-nourished in no acute distress.  He is alert and oriented x3. Respiratory system: Mild wheezing diffusely.  Good inspiratory effort.   Cardiovascular system: Regular rate and rhythm no rubs or gallops.   Gastrointestinal system: Abdomen soft nondistended normal bowel sounds present.   Central nervous system: Alert and oriented.  No focal neurological deficits.   Extremities: No lower extremity edema bilaterally. Skin: No rashes, lesions or ulcers. Psychiatry: Judgment and insight appear normal.  Mood is appropriate for condition and setting.  Data Reviewed: I have personally reviewed following labs and imaging studies  CBC: Recent Labs  Lab 12/25/20 0235 12/26/20 0341 12/27/20 0432 12/29/20 0343 12/31/20 0349  WBC 1.9* 2.2* 2.4* 2.6* 3.0*  NEUTROABS 1.4* 1.5* 1.6*  --   --   HGB 9.8* 10.1* 10.4* 12.6* 12.5*  HCT 30.0* 30.2* 30.6* 38.0* 37.6*  MCV 87.2 85.8 84.8 86.6 85.5  PLT 314 311 294 262 2662  Basic Metabolic Panel: Recent Labs  Lab 12/26/20 0341 12/27/20 0432  12/29/20 0343 12/30/20 1156 12/31/20 0349  NA 134* 134* 135 132* 135  K 4.1 3.7 3.4* 4.1 3.9  CL 102 104 103 102 104  CO2 _0 GLUCOSE 167* 135* 166* 97 113*  BUN _1 23* 21*  CREATININE 0.86 0.66 0.86 0.76 0.91  CALCIUM 8.4* 8.3* 7.9* 8.2* 8.1*  MG  --   --  2.1 1.9 1.9   GFR: Estimated Creatinine Clearance: 102.2 mL/min (by C-G formula based on SCr of 0.91 mg/dL). Liver  Function Tests: Recent Labs  Lab 12/25/20 0235 12/26/20 0341 12/27/20 0432  AST 27 35 31  ALT 26 46* 53*  ALKPHOS 46 48 46  BILITOT 0.5 0.4 0.5  PROT 5.6* 5.6* 5.2*  ALBUMIN 2.3* 2.4* 2.3*   No results for input(s): LIPASE, AMYLASE in the last 168 hours. No results for input(s): AMMONIA in the last 168 hours. Coagulation Profile: No results for input(s): INR, PROTIME in the last 168 hours.  Cardiac Enzymes: No results for input(s): CKTOTAL, CKMB, CKMBINDEX, TROPONINI in the last 168 hours. BNP (last 3 results) No results for input(s): PROBNP in the last 8760 hours. HbA1C: No results for input(s): HGBA1C in the last 72 hours. CBG: No results for input(s): GLUCAP in the last 168 hours. Lipid Profile: No results for input(s): CHOL, HDL, LDLCALC, TRIG, CHOLHDL, LDLDIRECT in the last 72 hours. Thyroid Function Tests: No results for input(s): TSH, T4TOTAL, FREET4, T3FREE, THYROIDAB in the last 72 hours. Anemia Panel: No results for input(s): VITAMINB12, FOLATE, FERRITIN, TIBC, IRON, RETICCTPCT in the last 72 hours. Sepsis Labs: No results for input(s): PROCALCITON, LATICACIDVEN in the last 168 hours.   Recent Results (from the past 240 hour(s))  Blood Culture (routine x 2)     Status: None   Collection Time: 12/22/20 10:03 AM   Specimen: BLOOD  Result Value Ref Range Status   Specimen Description   Final    BLOOD BLOOD RIGHT FOREARM Performed at [facility] [address], [place], [place] [address]    Special Requests   Final    BOTTLES DRAWN AEROBIC ONLY Blood Culture adequate volume Performed at [facility] [address], [place], [place] [address]    Culture   Final    NO GROWTH 5 DAYS Performed at [facility], [place] [address] [place], [place] [address]    Report Status 12/27/2020 FINAL  Final  Blood Culture (routine x 2)     Status: None   Collection Time: 12/22/20 10:08 AM   Specimen: BLOOD  Result Value Ref  Range Status   Specimen Description   Final    BLOOD LEFT ANTECUBITAL Performed at [facility] [address] [place], [place] [address]    Special Requests   Final    BOTTLES DRAWN AEROBIC AND ANAEROBIC Blood Culture adequate volume Performed at [facility] [address], [place], [place] [address]    Culture   Final    NO GROWTH 5 DAYS Performed at [facility], [place] [address] [place], [place] [address]    Report Status 12/27/2020 FINAL  Final  MRSA Next Gen by PCR, Nasal     Status: None   Collection Time: 12/22/20 10:08 AM   Specimen: Nasopharyngeal Swab; Nasal Swab  Result Value Ref Range Status   MRSA by PCR Next Gen NOT DETECTED NOT DETECTED Final    Comment: (NOTE) The GeneXpert MRSA Assay (FDA approved  for NASAL specimens only), is one component of a comprehensive MRSA colonization surveillance program. It is not intended to diagnose MRSA infection nor to guide or monitor treatment for MRSA infections. Test performance is not FDA approved in patients less than 67 years old. Performed at [facility], [place] [address], [place], [place] [address]   Resp Panel by RT-PCR (Flu A&B, Covid) Nasopharyngeal Swab     Status: None   Collection Time: 12/22/20 10:27 AM   Specimen: Nasopharyngeal Swab; Nasopharyngeal(NP) swabs in vial transport medium  Result Value Ref Range Status   SARS Coronavirus 2 by RT PCR NEGATIVE NEGATIVE Final    Comment: (NOTE) SARS-CoV-2 target nucleic acids are NOT DETECTED.  The SARS-CoV-2 RNA is generally detectable in upper respiratory specimens during the acute phase of infection. The lowest concentration of SARS-CoV-2 viral copies this assay can detect is 138 copies/mL. A negative result does not preclude SARS-Cov-2 infection and should not be used as the sole basis for treatment or other patient management decisions. A negative result may occur with  improper specimen  collection/handling, submission of specimen other than nasopharyngeal swab, presence of viral mutation(s) within the areas targeted by this assay, and inadequate number of viral copies(<138 copies/mL). A negative result must be combined with clinical observations, patient history, and epidemiological information. The expected result is Negative.  Fact Sheet for Patients:  EntrepreneurPulse.com.au  Fact Sheet for Healthcare Providers:  IncredibleEmployment.be  This test is no t yet approved or cleared by the [place] FDA and  has been authorized for detection and/or diagnosis of SARS-CoV-2 by FDA under an Emergency Use Authorization (EUA). This EUA will remain  in effect (meaning this test can be used) for the duration of the COVID-19 declaration under Section 564(b)(1) of the Act, 21 U.S.C.section 360bbb-3(b)(1), unless the authorization is terminated  or revoked sooner.       Influenza A by PCR NEGATIVE NEGATIVE Final   Influenza B by PCR NEGATIVE NEGATIVE Final    Comment: (NOTE) The Xpert Xpress SARS-CoV-2/FLU/RSV plus assay is intended as an aid in the diagnosis of influenza from Nasopharyngeal swab specimens and should not be used as a sole basis for treatment. Nasal washings and aspirates are unacceptable for Xpert Xpress SARS-CoV-2/FLU/RSV testing.  Fact Sheet for Patients: EntrepreneurPulse.com.au  Fact Sheet for Healthcare Providers: IncredibleEmployment.be  This test is not yet approved or cleared by the [place] FDA and has been authorized for detection and/or diagnosis of SARS-CoV-2 by FDA under an Emergency Use Authorization (EUA). This EUA will remain in effect (meaning this test can be used) for the duration of the COVID-19 declaration under Section 564(b)(1) of the Act, 21 U.S.C. section 360bbb-3(b)(1), unless the authorization is terminated or revoked.  Performed at [facility], [place] [address], [place], [place] [address]   Urine Culture     Status: None   Collection Time: 12/22/20 12:32 PM   Specimen: In/Out Cath Urine  Result Value Ref Range Status   Specimen Description   Final    IN/OUT CATH URINE Performed at [facility], [place] [address], [place], [place] [address]    Special Requests   Final    NONE Performed at [facility], [place] [address], [place], [place] [address]    Culture   Final    NO GROWTH Performed at [facility], [place] [address] [place], [place] [address]    Report Status 12/23/2020 FINAL  Final  Respiratory (~20  pathogens) panel by PCR     Status: None   Collection Time: 12/22/20  1:47 PM   Specimen: Nasopharyngeal Swab; Respiratory  Result Value Ref Range Status   Adenovirus NOT DETECTED NOT DETECTED Final   Coronavirus 229E NOT DETECTED NOT DETECTED Final    Comment: (NOTE) The Coronavirus on the Respiratory Panel, DOES NOT test for the novel  Coronavirus (2019 nCoV)    Coronavirus HKU1 NOT DETECTED NOT DETECTED Final   Coronavirus NL63 NOT DETECTED NOT DETECTED Final   Coronavirus OC43 NOT DETECTED NOT DETECTED Final   Metapneumovirus NOT DETECTED NOT DETECTED Final   Rhinovirus / Enterovirus NOT DETECTED NOT DETECTED Final   Influenza A NOT DETECTED NOT DETECTED Final   Influenza B NOT DETECTED NOT DETECTED Final   Parainfluenza Virus 1 NOT DETECTED NOT DETECTED Final   Parainfluenza Virus 2 NOT DETECTED NOT DETECTED Final   Parainfluenza Virus 3 NOT DETECTED NOT DETECTED Final   Parainfluenza Virus 4 NOT DETECTED NOT DETECTED Final   Respiratory Syncytial Virus NOT DETECTED NOT DETECTED Final   Bordetella pertussis NOT DETECTED NOT DETECTED Final   Bordetella Parapertussis NOT DETECTED NOT DETECTED Final   Chlamydophila pneumoniae NOT DETECTED NOT DETECTED Final   Mycoplasma pneumoniae NOT DETECTED NOT DETECTED Final    Comment: Performed at [facility], [address] [place], [place] [address]         Radiology Studies: DG Chest 2 View  Result Date: 12/30/2020 CLINICAL DATA:  Shortness of breath.  Pneumonia. EXAM: CHEST - 2 VIEW COMPARISON:  Chest x-ray dated December 26, 2020. FINDINGS: Normal heart size. New pneumomediastinum and right chest wall and lower neck subcutaneous emphysema. No pneumothorax. Diffuse interstitial opacity in both lungs is not significantly changed. Similar cavitation in the right mid lung. No pleural effusion. No acute osseous abnormality. IMPRESSION: 1. New pneumomediastinum and right chest wall and lower neck subcutaneous emphysema. No pneumothorax. 2. Unchanged diffuse interstitial opacities in both lungs which could reflect pneumonitis or multifocal pneumonia. Electronically Signed   By: Titus Dubin M.D.   On: 12/30/2020 12:49   CT CHEST WO CONTRAST  Result Date: 12/31/2020 CLINICAL DATA:  Crepitus, chest, soft tissue gas suspected; pneumomediastinum, subcutaneous emphysema EXAM: CT CHEST WITHOUT CONTRAST TECHNIQUE: Multidetector CT imaging of the chest was performed following the standard protocol without IV contrast. COMPARISON:  Chest radiograph, 12/30/2020.  CT, 12/22/2020. FINDINGS: Cardiovascular: Heart is normal in size. No pericardial effusion. Mild 2 vessel coronary artery calcifications. Great vessels are normal in caliber. Mild aortic atherosclerosis. Mediastinum/Nodes: Significant pneumomediastinum. There extends throughout the posterior, middle and anterior mediastinum. Pneumomediastinum expands the mediastinal contours, bulging against the medial aspect of the right upper lobe. Subcutaneous emphysema extends from the pneumo mediastinum into the neck base and along the anterior upper chest and right lateral and posterior chest wall. Thyroid surrounded by soft tissue air, normal in size. No neck base, mediastinal or hilar masses or enlarged lymph nodes. Trachea and esophagus are unremarkable.  Lungs/Pleura: Ground-glass and more confluent airspace opacities noted on the previous CT have improved. There is persistent airspace opacity with mild associated bronchiectasis and volume loss in the right upper and lower lobes. This has the appearance of fibrosis. Cystic spaces are noted in the posterior aspect of the right mid to upper lung, unchanged. There is also stable 9 mm right upper lobe nodule. Ground-glass opacities noted bilaterally are notably improved. Bilateral interstitial thickening is similar to the prior CT. No convincing pneumothorax.  No pleural  effusion. Upper Abdomen: Gallbladder is distended with a 1.6 cm stone in the gallbladder neck. No convincing wall thickening or adjacent inflammation, however. Large right adrenal mass, 5.9 x 3.0 x 5.7 cm, unchanged from the prior chest CT. Musculoskeletal: No fracture or acute finding.  No bone lesion. IMPRESSION: 1. Marked pneumomediastinum has developed since the prior chest CT, extending to the neck base soft tissues and right chest wall. There is no associated pneumothorax. Findings are consistent with barotrauma. 2. Improved lung opacities, specifically significant improvement in ground-glass lung opacities compared to the prior CT. More focal opacity with associated mild bronchiectasis and volume loss in the right mid to upper lung is stable, appearance consistent with fibrosis. No new lung abnormalities. 3. Coronary artery calcifications and aortic atherosclerosis. 4. 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 This has increased in size when compared to an abdomen and pelvis CT from 07/18/2020, previous largest transverse dimension 4.5 cm, currently 5.7 cm. Aortic Atherosclerosis (ICD10-I70.0) and Emphysema (ICD10-J43.9). Electronically Signed   By: Lajean Manes M.D.   On: 12/31/2020 15:03        Scheduled Meds:  acyclovir  800 mg Oral BID   amiodarone  200 mg Oral QPM   apixaban  2.5 mg Oral BID   atorvastatin  10 mg Oral Daily   ipratropium   0.5 mg Nebulization BID   levalbuterol  0.63 mg Nebulization BID   mouth rinse  15 mL Mouth Rinse BID   metoprolol succinate  100 mg Oral Daily   pantoprazole  40 mg Oral QHS   PARoxetine  10 mg Oral Daily   predniSONE  60 mg Oral Q breakfast   umeclidinium-vilanterol  1 puff Inhalation Daily   Continuous Infusions:      LOS: 8 days    Time spent: 39 minutes spent on chart review, discussion with nursing staff, consultants, updating family and interview/physical exam; more than 50% of that time was spent in counseling and/or coordination of care.    Kayleen Memos, DO [facility] Available via Epic secure chat 7am-7pm After these hours, please refer to coverage provider listed on [email or web address] 12/31/2020, 6:06 PM

## 2020-12-31 NOTE — Progress Notes (Signed)
Progress Note  Patient Name: John [name] Date of Encounter: 12/31/2020  [facility] Cardiologist: Fransico Him, MD   Subjective   States he feels well. Was able to work this morning. Once he gets his prednisone, however, he feels more anxious and his HR spike. Hoping to go home soon.  Inpatient Medications    Scheduled Meds:  acyclovir  800 mg Oral BID   amiodarone  200 mg Oral QPM   apixaban  2.5 mg Oral BID   atorvastatin  10 mg Oral Daily   ipratropium  0.5 mg Nebulization BID   levalbuterol  0.63 mg Nebulization BID   mouth rinse  15 mL Mouth Rinse BID   metoprolol succinate  100 mg Oral Daily   pantoprazole  40 mg Oral QHS   PARoxetine  10 mg Oral Daily   predniSONE  60 mg Oral Q breakfast   umeclidinium-vilanterol  1 puff Inhalation Daily   Continuous Infusions:  PRN Meds: acetaminophen **OR** acetaminophen, levalbuterol, ondansetron **OR** ondansetron (ZOFRAN) IV   Vital Signs    Vitals:   12/31/20 0525 12/31/20 0527 12/31/20 0529 12/31/20 0851  BP: 109/78 98/67 (!) 83/61   Pulse: (!) 59 63 63   Resp: 16     Temp: 97.6 F (36.4 C)     TempSrc: Oral     SpO2: 96%   95%  Weight:      Height:        Intake/Output Summary (Last 24 hours) at 12/31/2020 1158 Last data filed at 12/31/2020 0500 Gross per 24 hour  Intake 240 ml  Output 1100 ml  Net -860 ml   Last 3 Weights 12/25/2020 12/23/2020 12/22/2020  Weight (lbs) 207 lb 0.2 oz 200 lb 13.4 oz 205 lb  Weight (kg) 93.9 kg 91.1 kg 92.987 kg      Telemetry    Afib with HR 80-120 - Personally Reviewed  ECG    No new tracing - Personally Reviewed  Physical Exam   GEN: No acute distress.   Neck: No JVD Cardiac: Irregular, no murmur Respiratory: Scattered wheezes GI: Soft, nontender, non-distended  MS: No edema; No deformity. Neuro:  Nonfocal  Psych: Normal affect   Labs    High Sensitivity Troponin:   Recent Labs  Lab 12/22/20 1000 12/22/20 1234  TROPONINIHS 12 11      Chemistry Recent Labs  Lab 12/25/20 0235 12/26/20 0341 12/27/20 0432 12/29/20 0343 12/30/20 1156 12/31/20 0349  NA 134* 134* 134* 135 132* 135  K 3.9 4.1 3.7 3.4* 4.1 3.9  CL 103 102 104 103 102 104  CO2 25 27 27 27 26 27   GLUCOSE 183* 167* 135* 166* 97 113*  BUN 14 17 18 19  23* 21*  CREATININE 0.78 0.86 0.66 0.86 0.76 0.91  CALCIUM 8.3* 8.4* 8.3* 7.9* 8.2* 8.1*  MG  --   --   --  2.1 1.9 1.9  PROT 5.6* 5.6* 5.2*  --   --   --   ALBUMIN 2.3* 2.4* 2.3*  --   --   --   AST 27 35 31  --   --   --   ALT 26 46* 53*  --   --   --   ALKPHOS 46 48 46  --   --   --   BILITOT 0.5 0.4 0.5  --   --   --   GFRNONAA >60 >60 >60 >60 >60 >60  ANIONGAP 6 5 3* 5 4* 4*  Lipids No results for input(s): CHOL, TRIG, HDL, LABVLDL, LDLCALC, CHOLHDL in the last 168 hours.  Hematology Recent Labs  Lab 12/27/20 0432 12/29/20 0343 12/31/20 0349  WBC 2.4* 2.6* 3.0*  RBC 3.61* 4.39 4.40  HGB 10.4* 12.6* 12.5*  HCT 30.6* 38.0* 37.6*  MCV 84.8 86.6 85.5  MCH 28.8 28.7 28.4  MCHC 34.0 33.2 33.2  RDW 14.2 14.6 14.7  PLT 294 262 208   Thyroid No results for input(s): TSH, FREET4 in the last 168 hours.  BNPNo results for input(s): BNP, PROBNP in the last 168 hours.  DDimer No results for input(s): DDIMER in the last 168 hours.   Radiology    DG Chest 2 View  Result Date: 12/30/2020 CLINICAL DATA:  Shortness of breath.  Pneumonia. EXAM: CHEST - 2 VIEW COMPARISON:  Chest x-ray dated December 26, 2020. FINDINGS: Normal heart size. New pneumomediastinum and right chest wall and lower neck subcutaneous emphysema. No pneumothorax. Diffuse interstitial opacity in both lungs is not significantly changed. Similar cavitation in the right mid lung. No pleural effusion. No acute osseous abnormality. IMPRESSION: 1. New pneumomediastinum and right chest wall and lower neck subcutaneous emphysema. No pneumothorax. 2. Unchanged diffuse interstitial opacities in both lungs which could reflect pneumonitis or  multifocal pneumonia. Electronically Signed   By: Titus Dubin M.D.   On: 12/30/2020 12:49    Cardiac Studies   Echo 05/12/2020 1. Left ventricular ejection fraction, by estimation, is 60 to 65%. The  left ventricle has normal function. The left ventricle has no regional  wall motion abnormalities. Left ventricular diastolic parameters are  indeterminate.   2. Right ventricular systolic function is normal. The right ventricular  size is normal. Tricuspid regurgitation signal is inadequate for assessing  PA pressure.   3. The mitral valve is normal in structure. Trivial mitral valve  regurgitation. No evidence of mitral stenosis.   4. The aortic valve is tricuspid. Aortic valve regurgitation is not  visualized. No aortic stenosis is present.   5. The inferior vena cava is normal in size with greater than 50%  respiratory variability, suggesting right atrial pressure of 3 mmHg.   6. The patient is in atrial fibrillation.   Patient Profile     60 y.o. 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  Assessment & Plan    #Paroxysmal Afib: CHADs-vasc 3.Known diagnosis of Afib with history of DCCV. Had recurrent Afib on admission likely triggered by acute illness with drug-induced pneumonitis and steroid administration. Given that patient is still recovering from pneumonitis and remains on steroids, it is unlikely that he will maintain NSR after DCCV. Also do not want to increase his amiodarone further at this time in the setting of active pneumonitis. Now that HR are fairly well controlled, will just monitor on current regimen for now and arrange for  follow-up in Afib clinic after discharge. If needed, can consider digoxin for further rate control vs increasing amio.  -Continue metop 100mg  XL and amiodarone 200mg  daily for now -No plans for DCCV at this time as patient is stable and is unlikely to maintain NSR while active pneumonitis and on high dose steroid  -Can consider dig vs increasing amiodarone (although worry about doing  this with active pneumonitis) if additional rate control needed and no blood pressure room for BB uptitration -Continue dose adjusted apixaban 2.5mg  BID; increase back to 5mg  BID once completed course of itraconazole -Follow-up in Afib clinic arranged    [facility] will sign off.  Please call with questions or concerns  For questions or updates, please contact [place] Please consult [email or web address] for contact info under        Signed, Freada Bergeron, MD  12/31/2020, 11:58 AM

## 2020-12-31 NOTE — Progress Notes (Signed)
Pt refused to take Lipitor he said it will trigger his rheumatoid arthritis.

## 2021-01-01 ENCOUNTER — Inpatient Hospital Stay (HOSPITAL_COMMUNITY): Payer: 59

## 2021-01-01 MED ORDER — ACYCLOVIR 800 MG PO TABS: 800.0000 mg | ORAL_TABLET | Freq: Two times a day (BID) | ORAL | 0 refills | Status: AC

## 2021-01-01 MED ORDER — METOPROLOL SUCCINATE ER 25 MG PO TB24: 75.0000 mg | ORAL_TABLET | Freq: Every day | ORAL | 0 refills | Status: DC

## 2021-01-01 MED ORDER — ALBUTEROL SULFATE (2.5 MG/3ML) 0.083% IN NEBU: 2.5000 mg | INHALATION_SOLUTION | Freq: Four times a day (QID) | RESPIRATORY_TRACT | 0 refills | Status: DC | PRN

## 2021-01-01 MED ORDER — PREDNISONE 20 MG PO TABS: 60.0000 mg | ORAL_TABLET | Freq: Every day | ORAL | 0 refills | Status: AC

## 2021-01-01 MED ORDER — LEVALBUTEROL HCL 0.63 MG/3ML IN NEBU: 0.6300 mg | INHALATION_SOLUTION | Freq: Three times a day (TID) | RESPIRATORY_TRACT | 0 refills | Status: DC | PRN

## 2021-01-01 MED ORDER — AMIODARONE HCL 200 MG PO TABS
200.0000 mg | ORAL_TABLET | Freq: Two times a day (BID) | ORAL | Status: DC
  Administered 2021-01-01: 15:00:00 200 mg via ORAL
  Filled 2021-01-01: qty 1

## 2021-01-01 MED ORDER — APIXABAN 2.5 MG PO TABS: 2.5000 mg | ORAL_TABLET | Freq: Two times a day (BID) | ORAL | 0 refills | Status: DC

## 2021-01-01 MED ORDER — UMECLIDINIUM-VILANTEROL 62.5-25 MCG/ACT IN AEPB: 1.0000 | INHALATION_SPRAY | Freq: Every day | RESPIRATORY_TRACT | 0 refills | Status: DC

## 2021-01-01 MED ORDER — AMIODARONE HCL 200 MG PO TABS: 200.0000 mg | ORAL_TABLET | Freq: Two times a day (BID) | ORAL | 0 refills | Status: DC

## 2021-01-01 NOTE — TOC Transition Note (Signed)
Transition of Care [facility]) - CM/SW Discharge Note   Patient Details  Name: John [name] MRN: [id number] Date of Birth: 09-May-1960  [facility] of Care [facility]) CM/SW Contact:  Leeroy Cha, RN Phone Number: 01/01/2021, 2:26 PM   Clinical Narrative:    Patient being dcd to return home , oxygen and nebulizer ordered through [employer] at 1423.   Final next level of care: Home w Home Health Services Barriers to Discharge: Barriers Resolved   Patient Goals and CMS Choice   CMS Medicare.gov Compare Post Acute Care list provided to:: Patient    Discharge Placement                       Discharge Plan and Services   Discharge Planning Services: CM Consult Post Acute Care Choice: Durable Medical Equipment          DME Arranged: Oxygen, Nebulizer machine DME Agency: [employer] Date DME Agency Contacted: 01/01/21 Time DME Agency Contacted: [id number] Representative spoke with at DME Agency: [employer]            Social Determinants of Health ([place]) Interventions     Readmission Risk Interventions No flowsheet data found.

## 2021-01-01 NOTE — Progress Notes (Addendum)
SATURATION QUALIFICATIONS: (This note is used to comply with regulatory documentation for home oxygen)  Patient Saturations on Room Air at Rest = 95%  Patient Saturations on Room Air while Ambulating = 86%  Patient Saturations on 2 Liters of oxygen while Ambulating = 86%  Please briefly explain why patient needs home oxygen:to maintain saturation > 88% while  ambulating Tresa Endo PT Acute Rehabilitation Services Pager [phone] Office [phone]

## 2021-01-01 NOTE — Plan of Care (Signed)
  Problem: Clinical Measurements: Goal: Respiratory complications will improve Outcome: Adequate for Discharge   Problem: Clinical Measurements: Goal: Cardiovascular complication will be avoided Outcome: Adequate for Discharge   Problem: Clinical Measurements: Goal: Will remain free from infection Outcome: Adequate for Discharge

## 2021-01-01 NOTE — Discharge Summary (Signed)
Discharge Summary  John [name] HFW:[id number] DOB: 10/12/1960  PCP: [name], MD  Admit date: 12/22/2020 Discharge date: 01/01/2021  Time spent: 35 minutes  Recommendations for Outpatient Follow-up:  Follow-up with oncology  Discharge Diagnoses:  Active Hospital Problems   Diagnosis Date Noted   Drug-induced pneumonitis 12/23/2020   Acute on chronic respiratory failure with hypoxia (HCC) 12/22/2020   Leukocytopenia 12/22/2020   Severe sepsis ([place]) 10/06/2020   PAF (paroxysmal atrial fibrillation) (HCC)    Hyponatremia 05/12/2020   Stage 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 ([place]) 04/10/2020   COPD with asthma ([place]) 03/24/2020    Resolved Hospital Problems  No resolved problems to display.    Discharge Condition: Stable  Diet recommendation: Resume previous diet  Vitals:   01/01/21 1359 01/01/21 1612  BP: 94/62 91/78  Pulse: (!) 101 (!) 109  Resp: 20   Temp: 98 F (36.7 C)   SpO2: 95%     History of present illness:   John [name] is a 60 year old male with past medical history significant for stage 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 who just completed 11th cycle of Keytruda, paroxysmal atrial fibrillation who presented to [place] ED on 11/7 with progressive shortness of breath x1 week.  Associated with increased productive cough with white sputum and sweats/malaise/fatigue.   Recently seen in outpatient pulmonology office and prescribed Augmentin and prednisone with supple oxygen.  Patient's shortness of breath progressed on 6 L nasal cannula which prompted evaluation in the ED.   In the ED, patient requiring 15 L NRB, chest x-ray with diffuse bilateral heterogeneous opacities.  CTA chest without PE but findings consistent with progressive pneumonitis.  Lactic acid 2.0.  WBC 1.9.  ABG with pH 7.39, PCO2 46, PO2 26.  Patient was given IV Solu-Medrol, vancomycin, cefepime, azithromycin, Xopenex nebs.  Hospitalist service consulted for further evaluation  and management of acute hypoxic respiratory failure secondary to commune acquired pneumonia versus drug-induced pneumonitis.   12/31/2020: Patient was seen and examined at his bedside.  States his breathing is improved.  Reviewed his last chest x-ray which showed pneumomediastinum and subcutaneous emphysema.  CT scan chest without contrast was obtained to rule out pneumothorax or perforation.  It revealed marked normal mediastinum extending to the neck base soft tissues and right chest wall.  Findings are consistent with barotrauma.  Improved lung opacities and findings consistent with fibrosis affecting right mid to upper lung.  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  01/01/2021: Patient was seen and examined at his bedside.  Had no new complaints.  Repeated chest x-ray shows stable pneumomediastinum.  He denies any chest pain or dyspnea.  He had a home oxygen evaluation, requiring 3 L to maintain oxygen saturation greater than 88%.      Hospital Course:  Principal Problem:   Drug-induced pneumonitis Active Problems:   COPD with asthma ([place])   Stage 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 (HCC)   Hyponatremia   PAF (paroxysmal atrial fibrillation) (HCC)   Severe sepsis (HCC)   Acute on chronic respiratory failure with hypoxia (HCC)   Leukocytopenia  Acute hypoxic respiratory failure, POA Severe sepsis, POA Drug-induced pneumonitis vs community-acquired pneumonia Patient presenting to the ED with progressive shortness of breath despite outpatient treatment by pulmonology with Augmentin, prednisone and started on supplemental oxygen;  Chest x-ray with diffuse bilateral heterogeneous airspace opacities and CT angiogram chest negative for PE with worsening aeration of lungs bilaterally concerning for progressively worsening pneumonitis likely secondary to immunotherapy  treatment with Keytruda.  Required 15 L NRB on ED presentation.  Met criteria on admission with leukopenia, tachycardia, tachycardia  and hypotension that was responsive to IV fluid hydration.  PCCM signed off 11/10, outpatient follow-up (apt on 11/28 and 12/12) -- Seen by oncology.  Outpatient follow-up with [name]. --Prednisone 80 mg p.o. daily; taper to 60 mg p.o. daily on 12/31/2020.  With plan 6 wk slow taper on discharge after he sees [name].  --Anoro Ellipta 1 puff daily (will need LAMA/LABA on dc per PCCM) --Xopenex nebs q6h PRN, prescribed with DME nebulizer machine. --Continue supplemental oxygen, maintain SPO2 greater than 88%   Pneumomediastinum suspect secondary to barotrauma No evidence of pneumothorax or perforation on CT chest without contrast done on 12/31/2020. Chest x-ray done on 01/01/2021 stable.   Leukopenia: Improved Etiology likely secondary to chemotherapy. --Continue prophylactic antiviral acyclovir until seen by oncology.   Resolved hyponatremia Sodium 129 on admission, likely secondary to poor oral intake.  Improved. --Na 129>>132>134> 135 on 01/01/2021   Paroxysmal atrial fibrillation w/ RVR Follows with cardiology outpatient, Dr. Radford Pax.  Has required TEE/DCCV on 10/13/2020.  Seen by cardiology on 1115 with no plan for DCCV during hospitalization as likely exacerbated by his pneumonitis and prednisone use. --Amiodarone $RemoveBeforeDEI'200mg'[id number]$  PO daily --Increased metoprolol succinate to 100 mg p.o. daily --Eliquis 2.5 mg PO BID (on xarelto at home but changed to Eliquis due to incompatibility with itraconazole) will need to be on eliquis for 2 weeks prior to switching back to xarelto. Follow-up with cardiology outpatient.   Stage IV squamous cell carcinoma right lung --Further per oncology.   COPD --Started on Anoro during hospitalization.  Pulmonology recommends LAMA/LABA on discharge (Stioloto vs Anoro vs Bevespri) --continue oxygen as above   HLD: Atorvastatin 10 mg p.o. daily   Depression/anxiety: Paxil 10 mg p.o. 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 Will need to follow-up with medical oncology.     DVT prophylaxis: apixaban (ELIQUIS) tablet 2.5 mg Start: 12/23/20 2200 apixaban (ELIQUIS) tablet 2.5 mg    Code Status: Full Code      Consultants:  PCCM, Dr. Carlis Abbott - signed off 11/10 Medical oncology, [name]   Procedures:  None   Antimicrobials:  Azithromycin 11/7 - 11/10 Cefepime 11/7 - 11/10 Itraconazole 11/8 - 11/10 Acyclovir 11/8>> Vancomycin 11/7 -11/7    Discharge Exam: BP 91/78 (BP Location: Left Arm)   Pulse (!) 109   Temp 98 F (36.7 C) (Oral)   Resp 20   Ht $R'5\' 11"'Cq$  (1.803 m)   Wt 93.9 kg   SpO2 95%   BMI 28.87 kg/m  General: 60 y.o. year-old male well developed well nourished in no acute distress.  Alert and oriented x3. Cardiovascular: Regular rate and rhythm with no rubs or gallops.  No thyromegaly or JVD noted.   Respiratory: Mild rales at bases.   Abdomen: Soft nontender nondistended with normal bowel sounds x4 quadrants. Musculoskeletal: No lower extremity edema. 2/4 pulses in all 4 extremities. Skin: No ulcerative lesions noted or rashes, Psychiatry: Mood is appropriate for condition and setting  Discharge Instructions You were cared for by a hospitalist during your hospital stay. If you have any questions about your discharge medications or the care you received while you were in the hospital after you are discharged, you can call the unit and asked  to speak with the hospitalist on call if the hospitalist that took care of you is not available. Once you are discharged, your primary care physician will handle any further medical issues. Please note that NO REFILLS for any discharge medications will be authorized once you are discharged, as it is imperative that you return to your primary care physician (or establish a relationship with a primary care physician if you do not have one) for your aftercare  needs so that they can reassess your need for medications and monitor your lab values.   Allergies as of 01/01/2021       Reactions   Albuterol Other (See Comments)   Patient has had episode of atrial fib following administration of albuterol (tolerates Xopenex well)        Medication List     STOP taking these medications    amoxicillin-clavulanate 875-125 MG tablet Commonly known as: Augmentin   Keytruda 100 MG/4ML Soln Generic drug: pembrolizumab   rivaroxaban 20 MG Tabs tablet Commonly known as: XARELTO   Stiolto Respimat 2.5-2.5 MCG/ACT Aers Generic drug: Tiotropium Bromide-Olodaterol       TAKE these medications    acyclovir 800 MG tablet Commonly known as: ZOVIRAX Take 1 tablet (800 mg total) by mouth 2 (two) times daily for 7 days.   amiodarone 200 MG tablet Commonly known as: PACERONE Take 1 tablet (200 mg total) by mouth 2 (two) times daily. What changed: when to take this   apixaban 2.5 MG Tabs tablet Commonly known as: ELIQUIS Take 1 tablet (2.5 mg total) by mouth 2 (two) times daily.   atorvastatin 10 MG tablet Commonly known as: LIPITOR Take 1 tablet (10 mg total) by mouth daily.   levalbuterol 0.63 MG/3ML nebulizer solution Commonly known as: XOPENEX Take 3 mLs (0.63 mg total) by nebulization every 8 (eight) hours as needed for wheezing or shortness of breath.   levalbuterol 45 MCG/ACT inhaler Commonly known as: Xopenex HFA Inhale 2 puffs into the lungs every 4 (four) hours as needed for wheezing.   metoprolol succinate 25 MG 24 hr tablet Commonly known as: Toprol XL Take 3 tablets (75 mg total) by mouth daily. What changed: how much to take   PARoxetine 10 MG tablet Commonly known as: PAXIL Take 10 mg by mouth daily.   predniSONE 20 MG tablet Commonly known as: DELTASONE Take 3 tablets (60 mg total) by mouth daily with breakfast for 7 days. Start taking on: January 02, 2021 What changed:  medication strength how much to  take when to take this Another medication with the same name was removed. Continue taking this medication, and follow the directions you see here.   PriLOSEC OTC 20 MG tablet Generic drug: omeprazole Take 20 mg by mouth at bedtime.   umeclidinium-vilanterol 62.5-25 MCG/ACT Aepb Commonly known as: ANORO ELLIPTA Inhale 1 puff into the lungs daily.               Durable Medical Equipment  (From admission, onward)           Start     Ordered   01/01/21 1314  For home use only DME Nebulizer machine  Once       Question Answer Comment  Patient needs a nebulizer to treat with the following condition Pneumonitis   Length of Need 6 Months      01/01/21 1313   01/01/21 1310  For home use only DME oxygen  Once       Question Answer Comment  Length of Need 6 Months   Mode or (Route) Nasal cannula   Liters per Minute 3   Frequency Continuous (stationary and portable oxygen unit needed)   Oxygen conserving device Yes   Oxygen delivery system Gas      01/01/21 1309           Allergies  Allergen Reactions   Albuterol Other (See Comments)    Patient has had episode of atrial fib following administration of albuterol (tolerates Xopenex well)    Follow-up Information     [facility] [place] Follow up on 01/05/2021.   Specialty: Cardiology Why: Nov parking code is 4444 @ 10am hospital follow up Contact information: [id number] [place] [address] [phone]        [name], MD. Call in 1 day(s).   Specialty: Internal Medicine Why: Please call for a posthospital follow-up appointment. Contact information: [place] [address] [phone]         Sueanne Margarita, MD .   Specialty: Cardiology Contact information: [phone] [address] [place] [address] [phone]         Volanda Napoleon, MD. Call today.   Specialty: Oncology Why: Please call for a post  hospital follow-up appointment. Contact information: [place] [place] [address] [phone]                  The results of significant diagnostics from this hospitalization (including imaging, microbiology, ancillary and laboratory) are listed below for reference.    Significant Diagnostic Studies: DG Chest 2 View  Result Date: 12/30/2020 CLINICAL DATA:  Shortness of breath.  Pneumonia. EXAM: CHEST - 2 VIEW COMPARISON:  Chest x-ray dated December 26, 2020. FINDINGS: Normal heart size. New pneumomediastinum and right chest wall and lower neck subcutaneous emphysema. No pneumothorax. Diffuse interstitial opacity in both lungs is not significantly changed. Similar cavitation in the right mid lung. No pleural effusion. No acute osseous abnormality. IMPRESSION: 1. New pneumomediastinum and right chest wall and lower neck subcutaneous emphysema. No pneumothorax. 2. Unchanged diffuse interstitial opacities in both lungs which could reflect pneumonitis or multifocal pneumonia. Electronically Signed   By: Titus Dubin M.D.   On: 12/30/2020 12:49   DG Chest 2 View  Result Date: 12/18/2020 CLINICAL DATA:  Stage IV lung cancer. EXAM: CHEST - 2 VIEW COMPARISON:  Radiograph 10/10/2020, CT 822 22 FINDINGS: Normal cardiac silhouette. New streaky airspace consolidation in LEFT lung. Similar dense consolidative pattern in the RIGHT lung with central cavitation measuring 5.5 cm. There is some improvement density within the RIGHT lung about the cavitation. low lung volumes. No acute osseous abnormality. IMPRESSION: 1. New streaky airspace densities in the LEFT lung concerning for multifocal pneumonia versus lung cancer recurrence. 2. Persistent cavitation in the RIGHT mid lung. Some improvement in airspace density about the cavitation. These results will be called to the ordering clinician or representative by the Radiologist Assistant, and communication documented in the PACS  or [employer]. Electronically Signed   By: Suzy Bouchard M.D.   On: 12/18/2020 12:07   CT CHEST WO CONTRAST  Result Date: 12/31/2020 CLINICAL DATA:  Crepitus, chest, soft tissue gas suspected; pneumomediastinum, subcutaneous emphysema EXAM: CT CHEST WITHOUT CONTRAST TECHNIQUE: Multidetector CT imaging of the chest was performed following the standard protocol without IV contrast. COMPARISON:  Chest radiograph, 12/30/2020.  CT, 12/22/2020. FINDINGS: Cardiovascular: Heart is normal in size. No pericardial effusion. Mild  2 vessel coronary artery calcifications. Great vessels are normal in caliber. Mild aortic atherosclerosis. Mediastinum/Nodes: Significant pneumomediastinum. There extends throughout the posterior, middle and anterior mediastinum. Pneumomediastinum expands the mediastinal contours, bulging against the medial aspect of the right upper lobe. Subcutaneous emphysema extends from the pneumo mediastinum into the neck base and along the anterior upper chest and right lateral and posterior chest wall. Thyroid surrounded by soft tissue air, normal in size. No neck base, mediastinal or hilar masses or enlarged lymph nodes. Trachea and esophagus are unremarkable. Lungs/Pleura: Ground-glass and more confluent airspace opacities noted on the previous CT have improved. There is persistent airspace opacity with mild associated bronchiectasis and volume loss in the right upper and lower lobes. This has the appearance of fibrosis. Cystic spaces are noted in the posterior aspect of the right mid to upper lung, unchanged. There is also stable 9 mm right upper lobe nodule. Ground-glass opacities noted bilaterally are notably improved. Bilateral interstitial thickening is similar to the prior CT. No convincing pneumothorax.  No pleural effusion. Upper Abdomen: Gallbladder is distended with a 1.6 cm stone in the gallbladder neck. No convincing wall thickening or adjacent inflammation, however. Large right  adrenal mass, 5.9 x 3.0 x 5.7 cm, unchanged from the prior chest CT. Musculoskeletal: No fracture or acute finding.  No bone lesion. IMPRESSION: 1. Marked pneumomediastinum has developed since the prior chest CT, extending to the neck base soft tissues and right chest wall. There is no associated pneumothorax. Findings are consistent with barotrauma. 2. Improved lung opacities, specifically significant improvement in ground-glass lung opacities compared to the prior CT. More focal opacity with associated mild bronchiectasis and volume loss in the right mid to upper lung is stable, appearance consistent with fibrosis. No new lung abnormalities. 3. Coronary artery calcifications and aortic atherosclerosis. 4. 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 This has increased in size when compared to an abdomen and pelvis CT from 07/18/2020, previous largest transverse dimension 4.5 cm, currently 5.7 cm. Aortic Atherosclerosis (ICD10-I70.0) and Emphysema (ICD10-J43.9). Electronically Signed   By: Lajean Manes M.D.   On: 12/31/2020 15:03   CT Angio Chest PE W and/or Wo Contrast  Addendum Date: 12/22/2020   ADDENDUM REPORT: 12/22/2020 11:39 ADDENDUM: As mentioned in the body of the report, 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 Electronically Signed   By: Vinnie Langton M.D.   On: 12/22/2020 11:39   Result Date: 12/22/2020 CLINICAL DATA:  60 year old male with history of shortness of breath for 1 week. Evaluate for pulmonary embolism. EXAM: CT ANGIOGRAPHY CHEST WITH CONTRAST TECHNIQUE: Multidetector CT imaging of the chest was performed using the standard protocol during bolus administration of intravenous contrast. Multiplanar CT image reconstructions and MIPs were obtained to evaluate the vascular anatomy. CONTRAST:  35m OMNIPAQUE IOHEXOL 350 MG/ML SOLN COMPARISON:  Chest CT 10/06/2020. FINDINGS: Cardiovascular: Study is severely limited by a large amount  of patient respiratory motion. With these limitations in mind, there is no central, lobar or proximal segmental sized filling defect to clearly indicate the presence of clinically significant pulmonary embolism. Smaller distal segmental and subsegmental sized pulmonary emboli cannot be entirely excluded. Heart size is normal. There is no significant pericardial fluid, thickening or pericardial calcification. There is aortic atherosclerosis, as well as atherosclerosis of the great vessels of the mediastinum and the coronary arteries, including calcified atherosclerotic plaque in the left anterior descending and right coronary arteries. Mediastinum/Nodes: Previously noted prominent but nonenlarged mediastinal lymph nodes  appear stable compared to the prior examination. No definite pathologically enlarged mediastinal or hilar lymph nodes are noted. Esophagus is unremarkable in appearance. No axillary lymphadenopathy. Lungs/Pleura: Dense areas of airspace consolidation persist in the right upper and lower lobes. Interval cavitation of several areas of previously noted airspace consolidation in the right upper lobe posteriorly and in the superior segment of the right lower lobe. Today's study is most notable for widespread areas of ground-glass attenuation, septal thickening and regional areas of architectural distortion which have developed elsewhere in the lungs bilaterally, most evident throughout the mid to upper lungs, concerning for progressive pneumonitis. No definite new suspicious appearing pulmonary nodules or masses are noted. Mild diffuse bronchial wall thickening with mild centrilobular and paraseptal emphysema. Upper Abdomen: Large right adrenal mass increased compared to the prior study, currently measuring 6.4 x 3.7 cm, compatible with a large metastatic lesion. Spleen is incompletely imaged, but appears likely enlarged measuring at least 15.1 x 8.6 cm on axial images. Peripherally calcified gallstone in  the neck of the gallbladder measuring 1.8 cm in diameter. Musculoskeletal: There are no aggressive appearing lytic or blastic lesions noted in the visualized portions of the skeleton. Review of the MIP images confirms the above findings. IMPRESSION: 1. Limited study demonstrating no definite evidence of central, lobar or proximal segmental sized pulmonary embolism. Distal segmental and subsegmental sized emboli are not entirely excluded. 2. Worsening aeration in the lungs bilaterally, the appearance of which is concerning for progressively worsening pneumonitis as can be seen in the setting of immunotherapy (patient is reportedly on Keytruda). Further clinical evaluation is recommended. Severe multilobar bilateral pneumonia could be considered, however, lack of response to antibiotics makes this less likely unless the causative agent is not being effectively treated. 3. Aortic atherosclerosis, in addition to two vessel coronary artery disease. Please note that although the presence of coronary artery calcium documents the presence of coronary artery disease, the severity of this disease and any potential stenosis cannot be assessed on this non-gated CT examination. Assessment for potential risk factor modification, dietary therapy or pharmacologic therapy may be warranted, if clinically indicated. 4. Mild diffuse bronchial wall thickening with mild centrilobular and paraseptal emphysema; imaging findings suggestive of underlying COPD. These results will be called to the ordering clinician or representative by the Radiologist Assistant, and communication documented in the PACS or [employer]. Aortic Atherosclerosis (ICD10-I70.0) and Emphysema (ICD10-J43.9). Electronically Signed: By: Vinnie Langton M.D. On: 12/22/2020 11:27   DG CHEST PORT 1 VIEW  Result Date: 01/01/2021 CLINICAL DATA:  60 year old male with COPD, sepsis. Pneumomediastinum felt related to barotrauma. EXAM: PORTABLE CHEST 1 VIEW  COMPARISON:  Chest CT 12/31/2020 and earlier. FINDINGS: Portable AP semi upright view at 0606 hours. Mildly lower lung volumes. Stable cardiac size and mediastinal contours. Pneumomediastinum with moderate volume right chest wall and subcutaneous gas appears stable since yesterday. Underlying coarse bilateral pulmonary interstitial opacity has mildly improved. There is chronic architectural distortion in the right lung. No pneumothorax or pleural effusion identified. No areas of worsening ventilation. Stable visualized osseous structures. IMPRESSION: 1. Stable pneumomediastinum and subcutaneous gas since yesterday. 2. Mildly improved ventilation since yesterday. No pneumothorax or new cardiopulmonary abnormality. Electronically Signed   By: Genevie Ann M.D.   On: 01/01/2021 06:24   DG CHEST PORT 1 VIEW  Result Date: 12/26/2020 CLINICAL DATA:  Small cell lung cancer, possible pneumonitis EXAM: PORTABLE CHEST 1 VIEW COMPARISON:  Prior chest x-ray 12/24/2020 FINDINGS: Stable cardiac and mediastinal contours. No significant interval change in  the appearance of the chest. Extensive interstitial opacities bilaterally more general throughout the left lung and more focal on the right in the mid and upper lung. No new airspace infiltrate, pleural effusion or pneumothorax. Bullous changes in the right mid lung again noted. IMPRESSION: Stable appearance of the chest. No significant interval change compared to 12/24/2020. Persistent extensive interstitial airspace opacities throughout the left lung and primarily in the right upper lung. Electronically Signed   By: Jacqulynn Cadet M.D.   On: 12/26/2020 06:31   DG CHEST PORT 1 VIEW  Result Date: 12/24/2020 CLINICAL DATA:  Pneumonitis, assess for changes. EXAM: PORTABLE CHEST 1 VIEW COMPARISON:  December 22, 2020. FINDINGS: Trachea midline. Cardiomediastinal contours and hilar structures are stable. Persistent interstitial and alveolar opacities superimposed on cystic  changes in the RIGHT chest. Stable appearance of the chest since previous imaging. On limited assessment no acute skeletal process. IMPRESSION: Stable low lung volumes with diffuse interstitial and alveolar opacities with cystic and or cavitary changes in the RIGHT chest. Electronically Signed   By: Zetta Bills M.D.   On: 12/24/2020 09:57   DG Chest Port 1 View  Result Date: 12/22/2020 CLINICAL DATA:  Shortness of breath, hypoxia EXAM: PORTABLE CHEST 1 VIEW COMPARISON:  12/18/2020 FINDINGS: No significant change in AP portable chest radiographs with diffuse bilateral interstitial heterogeneous airspace opacity, as well as a cavitary lesion of the right hand. No new airspace opacity. The heart and mediastinum are unremarkable. IMPRESSION: No significant change in AP portable chest radiographs with diffuse bilateral interstitial heterogeneous airspace opacity, as well as a cavitary lesion of the right hand. No new airspace opacity. Electronically Signed   By: Delanna Ahmadi M.D.   On: 12/22/2020 10:22    Microbiology: No results found for this or any previous visit (from the past 240 hour(s)).   Labs: Basic Metabolic Panel: Recent Labs  Lab 12/26/20 0341 12/27/20 0432 12/29/20 0343 12/30/20 1156 12/31/20 0349  NA 134* 134* 135 132* 135  K 4.1 3.7 3.4* 4.1 3.9  CL 102 104 103 102 104  CO2 _0 GLUCOSE 167* 135* 166* 97 113*  BUN _1 23* 21*  CREATININE 0.86 0.66 0.86 0.76 0.91  CALCIUM 8.4* 8.3* 7.9* 8.2* 8.1*  MG  --   --  2.1 1.9 1.9   Liver Function Tests: Recent Labs  Lab 12/26/20 0341 12/27/20 0432  AST 35 31  ALT 46* 53*  ALKPHOS 48 46  BILITOT 0.4 0.5  PROT 5.6* 5.2*  ALBUMIN 2.4* 2.3*   No results for input(s): LIPASE, AMYLASE in the last 168 hours. No results for input(s): AMMONIA in the last 168 hours. CBC: Recent Labs  Lab 12/26/20 0341 12/27/20 0432 12/29/20 0343 12/31/20 0349  WBC 2.2* 2.4* 2.6* 3.0*  NEUTROABS 1.5* 1.6*  --   --   HGB  10.1* 10.4* 12.6* 12.5*  HCT 30.2* 30.6* 38.0* 37.6*  MCV 85.8 84.8 86.6 85.5  PLT 311 294 262 208   Cardiac Enzymes: No results for input(s): CKTOTAL, CKMB, CKMBINDEX, TROPONINI in the last 168 hours. BNP: BNP (last 3 results) Recent Labs    12/22/20 1000  BNP 17.9    ProBNP (last 3 results) No results for input(s): PROBNP in the last 8760 hours.  CBG: No results for input(s): GLUCAP in the last 168 hours.     Signed:  Kayleen Memos, MD [facility] 01/01/2021, 6:01 PM

## 2021-01-01 NOTE — Evaluation (Addendum)
Physical Therapy Evaluation Patient Details Name: John [name] MRN: [id number] DOB: 04-07-1960 Today's Date: 01/01/2021  History of Present Illness  John [name] is a 60 year old male with past medical history significant for stage 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 , paroxysmal atrial fibrillation who presented to [place] ED on 11/7 with progressive shortness of breath , increased productive cough with white sputum and sweats/malaise/fatigue.  Clinical Impression  Patient resting  in bed on RA. SPO2 96%, HR 112.  Patient began ambulation on RA, after 40', SPO2 88%. And HR 123 .Placed on 2   L . Patient ambulated x 140' more with HR up to 135, SPO2 dropped to 86% on 2 L. After return to room. Spo2 returned to 95% with 2 minutes rest. RN aware of HR and SPO2.  Patient will benefit from PT and more ambulation short distances with supervision  and HR  allows. Pt admitted with above diagnosis.  Pt currently with functional limitations due to the deficits listed below (see PT Problem List). Pt will benefit from skilled PT to increase their independence and safety with mobility to allow discharge to the venue listed below.        Recommendations for follow up therapy are one component of a multi-disciplinary discharge planning process, led by the attending physician.  Recommendations may be updated based on patient status, additional functional criteria and insurance authorization.  Follow Up Recommendations No PT follow up    Assistance Recommended at Discharge None  Functional Status Assessment Patient has had a recent decline in their functional status and demonstrates the ability to make significant improvements in function in a reasonable and predictable amount of time.  Equipment Recommendations  None recommended by PT    Recommendations for Other Services       Precautions / Restrictions Precautions Precaution Comments: afib, monitor sats and HR      Mobility  Bed  Mobility Overal bed mobility: Independent                  Transfers Overall transfer level: Independent                      Ambulation/Gait Ambulation/Gait assistance: Supervision Gait Distance (Feet): 180 Feet Assistive device: None Gait Pattern/deviations: Step-through pattern Gait velocity: decresed     General Gait Details: stopped x 3 to support on rail and chec sats/HR  Stairs            Wheelchair Mobility    Modified Rankin (Stroke Patients Only)       Balance Overall balance assessment: No apparent balance deficits (not formally assessed)                                           Pertinent Vitals/Pain Pain Assessment: No/denies pain    Home Living Family/patient expects to be discharged to:: Private residence Living Arrangements: Spouse/significant other Available Help at Discharge: Family Type of Home: House Home Access: Stairs to enter   Technical brewer of Steps: 3   Home Layout: One level Home Equipment: Conservation officer, nature (2 wheels);Wheelchair - manual Additional Comments: home O2    Prior Function Prior Level of Function : Independent/Modified Independent                     Hand Dominance        Extremity/Trunk Assessment  Upper Extremity Assessment Upper Extremity Assessment: Overall WFL for tasks assessed    Lower Extremity Assessment Lower Extremity Assessment: Overall WFL for tasks assessed    Cervical / Trunk Assessment Cervical / Trunk Assessment: Normal  Communication      Cognition Arousal/Alertness: Awake/alert Behavior During Therapy: WFL for tasks assessed/performed Overall Cognitive Status: Within Functional Limits for tasks assessed                                          General Comments      Exercises     Assessment/Plan    PT Assessment Patient needs continued PT services  PT Problem List Cardiopulmonary status limiting activity        PT Treatment Interventions Therapeutic activities;Functional mobility training;Patient/family education;Gait training    PT Goals (Current goals can be found in the Care Plan section)  Acute Rehab PT Goals Patient Stated Goal: to go home PT Goal Formulation: With patient Time For Goal Achievement: 01/08/21 Potential to Achieve Goals: Good    Frequency Min 3X/week   Barriers to discharge        Co-evaluation               AM-PAC PT "6 Clicks" Mobility  Outcome Measure Help needed turning from your back to your side while in a flat bed without using bedrails?: None Help needed moving from lying on your back to sitting on the side of a flat bed without using bedrails?: None Help needed moving to and from a bed to a chair (including a wheelchair)?: None Help needed standing up from a chair using your arms (e.g., wheelchair or bedside chair)?: None Help needed to walk in hospital room?: None Help needed climbing 3-5 steps with a railing? : A Little 6 Click Score: 23    End of Session Equipment Utilized During Treatment: Oxygen Activity Tolerance: Patient tolerated treatment well;Treatment limited secondary to medical complications (Comment) Patient left: in bed;with call bell/phone within reach Nurse Communication: Mobility status      Time: 1212-1235 PT Time Calculation (min) (ACUTE ONLY): 23 min   Charges:   PT Evaluation $PT Eval Low Complexity: 1 Low PT Treatments $Gait Training: 8-22 mins        [place] Pager [phone] Office [phone]   Claretha Cooper 01/01/2021, 12:44 PM

## 2021-01-05 ENCOUNTER — Ambulatory Visit (HOSPITAL_COMMUNITY)
Admit: 2021-01-05 | Discharge: 2021-01-05 | Disposition: A | Discharge status: Home / Self Care | Payer: 59 | Attending: Physician Assistant | Admitting: Physician Assistant

## 2021-01-05 ENCOUNTER — Encounter: Payer: Self-pay | Admitting: Emergency Medicine

## 2021-01-05 VITALS — BP 76/56 | HR 73 | Ht 71.0 in | Wt 204.8 lb

## 2021-01-05 DIAGNOSIS — C3490 Malignant neoplasm of unspecified part of unspecified bronchus or lung: Secondary | ICD-10-CM | POA: Diagnosis not present

## 2021-01-05 DIAGNOSIS — D6869 Other thrombophilia: Secondary | ICD-10-CM | POA: Insufficient documentation

## 2021-01-05 DIAGNOSIS — J704 Drug-induced interstitial lung disorders, unspecified: Secondary | ICD-10-CM | POA: Diagnosis not present

## 2021-01-05 DIAGNOSIS — I4819 Other persistent atrial fibrillation: Secondary | ICD-10-CM | POA: Diagnosis present

## 2021-01-05 MED ORDER — METOPROLOL SUCCINATE ER 25 MG PO TB24: ORAL_TABLET | ORAL | Status: DC

## 2021-01-05 MED ORDER — PREDNISONE 10 MG PO TABS: 30.0000 mg | ORAL_TABLET | Freq: Every day | ORAL | 0 refills | Status: AC

## 2021-01-05 MED ORDER — APIXABAN 5 MG PO TABS
5.0000 mg | ORAL_TABLET | Freq: Two times a day (BID) | ORAL | 6 refills | Status: DC
Start: 2021-01-05 — End: 2021-09-04

## 2021-01-05 MED ORDER — AMIODARONE HCL 200 MG PO TABS: 200.0000 mg | ORAL_TABLET | Freq: Every day | ORAL | Status: DC

## 2021-01-05 NOTE — Progress Notes (Signed)
Primary Care Physician: [name], MD Primary Cardiologist: Dr Radford Pax Primary Electrophysiologist: none Referring Physician: Dr Lenny Pastel John [name] is a 60 y.o. male with a history of stage IIIb/IV lung cancer, RA, and atrial fibrillation who presents for follow up in the [facility].  The patient was initially diagnosed with atrial fibrillation 04/2020 after presenting to the ED with generalized weakness. He had been started on systemic chemotherapy recently PTA.  He was noted to be tachycardic in rapid atrial fibrillation.  He was hospitalized for further management of acute C diff diarrhea, sepsis and febrile neutropenia. He was started on amiodarone which converted him to SR. Anticoagulation was not started given elevated bleeding risk with tumor pressing on pulmonary artery. Patient has a CHADS2VASC score of 3. Patient has not had any recurrence of heart racing and feels "great". There was concern about lung toxicity with either amiodarone or Keytruda on CT 07/18/20 and his amiodarone was discontinued.   Patient was hospitalized again 09/2020 with neutropenic fever with pseudomonal bacteremia/sepsis. During the admission, he developed recurrent afib. After d/w pulmonology, amiodarone was resumed and he underwent DCCV on 10/13/20.   Patient was hospitalized with acute pneumonitis vs community acquired pneumonia. He presented 11/7 with progressive worsening SOB, productive cough with sputum and fatigue despite tx with Augmentin, steroids and supplemental oxygen. He was found hypoxic and required 15 L NRB. He was admitted for sepsis with acute hypoxic respiratory failure 2nd to CAP vs drug induced pneumonitis from [facility].  CTA of chest without PE. He was treated with IV steroids and broad spectrum antibiotics. Patient was initially followed by pulmonary who recommended to continue amiodarone 200mg  daily.  He was also followed by oncology. He was in sinus rhythm on  admit. Went into afib RVR on 11/12. His Toprol XL increased to 100mg  daily from 25mg  PTA. His Xarelto changed to Eliquis 2.5mg  BID due to incompatibility with itraconazole.   On follow up today, patient reports that his "lungs feel better than they have in years." He converted back to SR yesterday. He does have dizziness today with position changes.   Today, he denies symptoms of palpitations, chest pain, orthopnea, PND, lower extremity edema, dizziness, presyncope, syncope, snoring, daytime somnolence, bleeding, or neurologic sequela. The patient is tolerating medications without difficulties and is otherwise without complaint today.    Atrial Fibrillation Risk Factors:  he does not have symptoms or diagnosis of sleep apnea. he does not have a history of rheumatic fever. The patient does have a history of early familial atrial fibrillation or other arrhythmias. Mother has afib.  he has a BMI of Body mass index is 28.56 kg/m.[name] Filed Weights   01/05/21 0954  Weight: 92.9 kg      Family History  Problem Relation Age of Onset   Arrhythmia Mother        has PPM   Heart disease Father        Died at 49, started in his 43s, heart attacks, had PPM and ICD     Atrial Fibrillation Management history:  Previous antiarrhythmic drugs: amiodarone  Previous cardioversions: 10/13/20 Previous ablations: none CHADS2VASC score: 3 Anticoagulation history: Xarelto, Eliquis   Past Medical History:  Diagnosis Date   Dyspnea    Family history of adverse reaction to anesthesia    brother with seizures had episode under anesthesia.  Patient has seizures as well.   GERD (gastroesophageal reflux disease)    History of radiation therapy 04/24/2020-05/16/2020  IMRT to right lung     Dr [name]   PAF (paroxysmal atrial fibrillation) ([place])    CHADS2VSAC score 0   Rheumatoid aortitis    Squamous cell lung cancer [facility])    Past Surgical History:  Procedure Laterality Date   BRONCHIAL BRUSHINGS   03/27/2020   Procedure: BRONCHIAL BRUSHINGS;  Surgeon: [name], MD;  Location: [place];  Service: Cardiopulmonary;;   BUBBLE STUDY  10/13/2020   Procedure: BUBBLE STUDY;  Surgeon: Pixie Casino, MD;  Location: [place];  Service: Cardiovascular;;   CARDIOVERSION N/A 10/13/2020   Procedure: CARDIOVERSION;  Surgeon: Pixie Casino, MD;  Location: [facility] ENDOSCOPY;  Service: Cardiovascular;  Laterality: N/A;   CATARACT EXTRACTION  2016   at [place]  03/27/2020   Procedure: FINE NEEDLE ASPIRATION;  Surgeon: [name], MD;  Location: [place];  Service: Cardiopulmonary;;   HIP SURGERY Left    TEE WITHOUT CARDIOVERSION N/A 10/13/2020   Procedure: TRANSESOPHAGEAL ECHOCARDIOGRAM (TEE);  Surgeon: Pixie Casino, MD;  Location: [place];  Service: Cardiovascular;  Laterality: N/A;   VIDEO BRONCHOSCOPY WITH ENDOBRONCHIAL ULTRASOUND N/A 03/27/2020   Procedure: VIDEO BRONCHOSCOPY WITH ENDOBRONCHIAL ULTRASOUND;  Surgeon: [name], MD;  Location: [place];  Service: Cardiopulmonary;  Laterality: N/A;    Current Outpatient Medications  Medication Sig Dispense Refill   acyclovir (ZOVIRAX) 800 MG tablet Take 1 tablet (800 mg total) by mouth 2 (two) times daily for 7 days. 14 tablet 0   amiodarone (PACERONE) 200 MG tablet Take 1 tablet (200 mg total) by mouth 2 (two) times daily. 60 tablet 0   apixaban (ELIQUIS) 2.5 MG TABS tablet Take 1 tablet (2.5 mg total) by mouth 2 (two) times daily. 180 tablet 0   levalbuterol (XOPENEX HFA) 45 MCG/ACT inhaler Inhale 2 puffs into the lungs every 4 (four) hours as needed for wheezing. 1 each 12   levalbuterol (XOPENEX) 0.63 MG/3ML nebulizer solution Take 3 mLs (0.63 mg total) by nebulization every 8 (eight) hours as needed for wheezing or shortness of breath. 36 mL 0   metoprolol succinate (TOPROL XL) 25 MG 24 hr tablet Take 3 tablets (75 mg total) by mouth daily. 90 tablet 0   PARoxetine (PAXIL) 10 MG tablet  Take 10 mg by mouth daily.     predniSONE (DELTASONE) 20 MG tablet Take 3 tablets (60 mg total) by mouth daily with breakfast for 7 days. 21 tablet 0   PRILOSEC OTC 20 MG tablet Take 20 mg by mouth at bedtime.     umeclidinium-vilanterol (ANORO ELLIPTA) 62.5-25 MCG/ACT AEPB Inhale 1 puff into the lungs daily. 90 each 0   atorvastatin (LIPITOR) 10 MG tablet Take 1 tablet (10 mg total) by mouth daily. (Patient not taking: Reported on 01/05/2021) 90 tablet 3   No current facility-administered medications for this encounter.    Allergies  Allergen Reactions   Albuterol Other (See Comments)    Patient has had episode of atrial fib following administration of albuterol (tolerates Xopenex well)    Social History   Socioeconomic History   Marital status: Married    Spouse name: Not on file   Number of children: Not on file   Years of education: Not on file   Highest education level: Not on file  Occupational History   Not on file  Tobacco Use   Smoking status: Former    Packs/day: 1.00    Years: 30.00    Pack years: 30.00  Types: Cigarettes    Quit date: 2018    Years since quitting: 4.8   Smokeless tobacco: Never   Tobacco comments:    Former smoker 10/28/2020  Vaping Use   Vaping Use: Never used  Substance and Sexual Activity   Alcohol use: Not Currently   Drug use: Never   Sexual activity: Not on file  Other Topics Concern   Not on file  Social History Narrative   ** Merged History Encounter **       Social Determinants of Health   Financial Resource Strain: Not on file  Food Insecurity: Not on file  Transportation Needs: Not on file  Physical Activity: Not on file  Stress: Not on file  Social Connections: Not on file  Intimate Partner Violence: Not on file     ROS- All systems are reviewed and negative except as per the HPI above.  Physical Exam: Vitals:   01/05/21 0954  BP: (!) 76/56  Pulse: 73  Weight: 92.9 kg  Height: 5\' 11"  (1.803 m)    GEN-  The patient is a well appearing male, alert and oriented x 3 today.   HEENT-head normocephalic, atraumatic, sclera clear, conjunctiva pink, hearing intact, trachea midline. Lungs- wheezing R>L, on O2 nasal cannula  Heart- Regular rate and rhythm, no murmurs, rubs or gallops  GI- soft, NT, ND, + BS Extremities- no clubbing, cyanosis, or edema MS- no significant deformity or atrophy Skin- no rash or lesion Psych- euthymic mood, full affect Neuro- strength and sensation are intact   Wt Readings from Last 3 Encounters:  01/05/21 92.9 kg  12/25/20 93.9 kg  12/18/20 93.8 kg    EKG today demonstrates  SR Vent. rate 73 BPM PR interval 174 ms QRS duration 88 ms QT/QTcB 402/442 ms  Echo 05/12/20 demonstrated   1. Left ventricular ejection fraction, by estimation, is 60 to 65%. The left ventricle has normal function. The left ventricle has no regional wall motion abnormalities. Left ventricular diastolic parameters are indeterminate.   2. Right ventricular systolic function is normal. The right ventricular  size is normal. Tricuspid regurgitation signal is inadequate for assessing PA pressure.   3. The mitral valve is normal in structure. Trivial mitral valve  regurgitation. No evidence of mitral stenosis.   4. The aortic valve is tricuspid. Aortic valve regurgitation is not  visualized. No aortic stenosis is present.   5. The inferior vena cava is normal in size with greater than 50%  respiratory variability, suggesting right atrial pressure of 3 mmHg.   6. The patient is in atrial fibrillation.   Epic records are reviewed at length today  CHA2DS2-VASc Score = 3  The patient's score is based upon: CHF History: 0 HTN History: 0 Diabetes History: 0 Stroke History: 2 (possible spenic embolic event) Vascular Disease History: 1 Age Score: 0 Gender Score: 0       ASSESSMENT AND PLAN: 1. Persistent Atrial Fibrillation (ICD10:  I48.19) The patient's CHA2DS2-VASc score is 3,  indicating a 3.2% annual risk of stroke.   Patient converted back to SR. Decrease amiodarone to 200 mg daily, he reported he has been taking BID. Increase Eliquis to 5 mg BID now off itraconazole.  Decrease Toprol to 25 mg daily given low BP  2. Secondary Hypercoagulable State (ICD10:  D68.69) The patient is at significant risk for stroke/thromboembolism based upon his CHA2DS2-VASc Score of 3.  Continue Rivaroxaban (Xarelto).   3. Non-small cell lung cancer Plans per pulmonology and oncology.  4. Drug-induced  pneumonitis Keytruda stopped. Not felt to be 2/2 amiodarone at this time per pulmonary notes.   Follow up with Dr Radford Pax as scheduled. AF clinic in 2 months.    [facility] [address] [place], [place] [address] [phone] 01/05/2021 10:12 AM

## 2021-01-05 NOTE — Patient Instructions (Signed)
Decrease Metoprolol dose to 25mg - Take one tablet by mouth daily Increase Eliquis -5mg  - Take one tablet by mouth twice daily Decrease Amiodarone -200mg - Take one tablet by mouth daily

## 2021-01-05 NOTE — Telephone Encounter (Signed)
Please ask him to decrease to 30mg  daily and then he will need an OV with [place] in office to plan a taper > probably 30mg  x 2 weeks, 20mg  x 2 weeks, 10mg  x 2 weeks and then to off.   He will need script to cover him for this.

## 2021-01-06 ENCOUNTER — Telehealth: Payer: Self-pay

## 2021-01-06 NOTE — Telephone Encounter (Signed)
Patient called stating he still has an appt scheduled for tomorrow for MD and infusion and was under the impression that appt needed cancelled and he was to come in after the holidays to discuss new treatment options. Discussed with MD who would like pt to come in next week for lab and MD only, cancelled tomorrows appt,scheduling message sent and pt notified.

## 2021-01-07 ENCOUNTER — Inpatient Hospital Stay: Payer: 59

## 2021-01-07 ENCOUNTER — Inpatient Hospital Stay: Payer: 59 | Admitting: Hematology & Oncology

## 2021-01-10 ENCOUNTER — Other Ambulatory Visit: Payer: Self-pay | Admitting: Pulmonary Disease

## 2021-01-10 DIAGNOSIS — R0602 Shortness of breath: Secondary | ICD-10-CM

## 2021-01-10 DIAGNOSIS — J449 Chronic obstructive pulmonary disease, unspecified: Secondary | ICD-10-CM

## 2021-01-10 MED ORDER — LEVALBUTEROL HCL 0.63 MG/3ML IN NEBU: 0.6300 mg | INHALATION_SOLUTION | Freq: Three times a day (TID) | RESPIRATORY_TRACT | 10 refills | Status: DC | PRN

## 2021-01-10 NOTE — Progress Notes (Signed)
Pt notified provider that xopenex had no refills. Pt with upcoming appt with clinic this week.   Wyn Quaker NP

## 2021-01-12 ENCOUNTER — Other Ambulatory Visit: Payer: Self-pay

## 2021-01-12 ENCOUNTER — Ambulatory Visit (INDEPENDENT_AMBULATORY_CARE_PROVIDER_SITE_OTHER): Payer: 59

## 2021-01-12 ENCOUNTER — Encounter: Payer: Self-pay | Admitting: Primary Care

## 2021-01-12 ENCOUNTER — Ambulatory Visit (INDEPENDENT_AMBULATORY_CARE_PROVIDER_SITE_OTHER): Payer: 59 | Admitting: Primary Care

## 2021-01-12 VITALS — BP 100/60 | HR 77 | Temp 98.1°F | Ht 71.0 in | Wt 205.2 lb

## 2021-01-12 DIAGNOSIS — J189 Pneumonia, unspecified organism: Secondary | ICD-10-CM

## 2021-01-12 DIAGNOSIS — J984 Other disorders of lung: Secondary | ICD-10-CM

## 2021-01-12 DIAGNOSIS — J9611 Chronic respiratory failure with hypoxia: Secondary | ICD-10-CM | POA: Diagnosis not present

## 2021-01-12 DIAGNOSIS — C3490 Malignant neoplasm of unspecified part of unspecified bronchus or lung: Secondary | ICD-10-CM

## 2021-01-12 DIAGNOSIS — J449 Chronic obstructive pulmonary disease, unspecified: Secondary | ICD-10-CM

## 2021-01-12 DIAGNOSIS — T50905A Adverse effect of unspecified drugs, medicaments and biological substances, initial encounter: Secondary | ICD-10-CM

## 2021-01-12 NOTE — Progress Notes (Signed)
@Patient  ID: John [name], male    DOB: 15-Apr-1960, 60 y.o.   MRN: [id number]  Chief Complaint  Patient presents with   Hospitalization Follow-up    Referring provider: [name], MD  HPI: 60 year old male, former smoker quit in 2018 (30-pack-year history).  Past medical history significant for A. 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 DME company is [employer].   01/12/2021 Patient presents today for hospital follow-up. He was admitted from 12/22/20-01/01/21 for drug induced pneumonitis. Initially requiring 15L NRB. CXR showed diffuse bilateral heterogenous airspace opacities. CTA chest negative for PE with worsening aeration of lungs bilaterally concerning for progressively worsening pneumonitis likely secondary to immunotherapy treatment with [place]. Seen by PCCM team, he was discharged on 6 week prednisone taper. He will need outpatient follow up with oncology for large right adrenal mass and lung cancer. He was started on Anoro by pulmonary for COPD and will continue supplemental oxygen.   Accompanied by his wife. He tells me that he feels "fine" today. States that this is the best his lungs have felt in a long time. He gets winded with moderate exertion. He is currently on 2L oxygen. He has not been wearing oxygen the last several days. He also does not wear oxygen at bedtime but when he does wear it he feels better. He has a rare productive cough. He has not needed to use levalbuterol. His appetite is good. He is on amiodarone for afib. He needs repeat CT chest in 3-4 months.  He has an apt with [name] on Wednesday to discuss further treatment options for lung cancer.   Imaging:  01/01/21 CXR >> stable pneumomediastinum and subcutaneous gas. Mildly improved ventilation. No pneumothorax or new cardiopulmonary abnormality.   01/12/21 CXR >> Stable bilateral coarse  interstitial densities   Allergies  Allergen Reactions   Albuterol Other (See Comments)    Patient has had episode of atrial fib following administration of albuterol (tolerates Xopenex well)    Immunization History  Administered Date(s) Administered   PFIZER Comirnaty(Gray Top)Covid-19 Tri-Sucrose Vaccine 05/10/2019, 06/04/2019, 12/31/2019    Past Medical History:  Diagnosis Date   Dyspnea    Family history of adverse reaction to anesthesia    brother with seizures had episode under anesthesia.  Patient has seizures as well.   GERD (gastroesophageal reflux disease)    History of radiation therapy 04/24/2020-05/16/2020   IMRT to right lung     Dr [name]   PAF (paroxysmal atrial fibrillation) ([place])    CHADS2VSAC score 0   Rheumatoid aortitis    Squamous cell lung cancer ([place])     Tobacco History: Social History   Tobacco Use  Smoking Status Former   Packs/day: 1.00   Years: 30.00   Pack years: 30.00   Types: Cigarettes   Quit date: 2018   Years since quitting: 4.9  Smokeless Tobacco Never  Tobacco Comments   Former smoker 10/28/2020   Counseling given: Not Answered Tobacco comments: Former smoker 10/28/2020   Outpatient Medications Prior to Visit  Medication Sig Dispense Refill   amiodarone (PACERONE) 200 MG tablet Take 1 tablet (200 mg total) by mouth daily.     apixaban (ELIQUIS) 5 MG TABS tablet Take 1 tablet (5 mg total) by mouth 2 (two) times daily. 60 tablet 6   levalbuterol (XOPENEX HFA) 45 MCG/ACT inhaler Inhale 2 puffs into the lungs every 4 (four) hours as needed for wheezing. 1 each  12   levalbuterol (XOPENEX) 0.63 MG/3ML nebulizer solution Take 3 mLs (0.63 mg total) by nebulization every 8 (eight) hours as needed for wheezing or shortness of breath. 36 mL 10   metoprolol succinate (TOPROL XL) 25 MG 24 hr tablet Take one tablet by mouth daily     PARoxetine (PAXIL) 10 MG tablet Take 10 mg by mouth daily.     predniSONE (DELTASONE) 10 MG tablet Take 3  tablets (30 mg total) by mouth daily with breakfast for 7 days. 21 tablet 0   PRILOSEC OTC 20 MG tablet Take 20 mg by mouth at bedtime.     umeclidinium-vilanterol (ANORO ELLIPTA) 62.5-25 MCG/ACT AEPB Inhale 1 puff into the lungs daily. 90 each 0   atorvastatin (LIPITOR) 10 MG tablet Take 1 tablet (10 mg total) by mouth daily. (Patient not taking: Reported on 01/05/2021) 90 tablet 3   No facility-administered medications prior to visit.   Review of Systems  Review of Systems  Constitutional: Negative.   HENT: Negative.    Respiratory:  Positive for cough and shortness of breath. Negative for chest tightness and wheezing.     Physical Exam  BP 100/60 (BP Location: Left Arm, Patient Position: Sitting)   Pulse 77   Temp 98.1 F (36.7 C) (Oral)   Ht 5\' 11"  (1.803 m)   Wt 205 lb 3.2 oz (93.1 kg)   SpO2 93%   BMI 28.62 kg/m  Physical Exam Constitutional:      General: He is not in acute distress.    Appearance: Normal appearance. He is not ill-appearing.  HENT:     Head: Normocephalic and atraumatic.  Cardiovascular:     Rate and Rhythm: Normal rate and regular rhythm.     Comments: RRR Pulmonary:     Effort: Pulmonary effort is normal.     Breath sounds: Normal breath sounds. No wheezing, rhonchi or rales.     Comments: Exertional dyspnea Neurological:     General: No focal deficit present.     Mental Status: He is alert and oriented to person, place, and time. Mental status is at baseline.  Psychiatric:        Mood and Affect: Mood normal.        Behavior: Behavior normal.        Thought Content: Thought content normal.        Judgment: Judgment normal.     Lab Results:  CBC    Component Value Date/Time   WBC 3.0 (L) 12/31/2020 0349   RBC 4.40 12/31/2020 0349   HGB 12.5 (L) 12/31/2020 0349   HGB 12.7 (L) 12/15/2020 0847   HCT 37.6 (L) 12/31/2020 0349   PLT 208 12/31/2020 0349   PLT 240 12/15/2020 0847   MCV 85.5 12/31/2020 0349   MCH 28.4 12/31/2020 0349    MCHC 33.2 12/31/2020 0349   RDW 14.7 12/31/2020 0349   LYMPHSABS 0.3 (L) 12/27/2020 0432   MONOABS 0.4 12/27/2020 0432   EOSABS 0.0 12/27/2020 0432   BASOSABS 0.0 12/27/2020 0432    BMET    Component Value Date/Time   NA 135 12/31/2020 0349   K 3.9 12/31/2020 0349   CL 104 12/31/2020 0349   CO2 27 12/31/2020 0349   GLUCOSE 113 (H) 12/31/2020 0349   BUN 21 (H) 12/31/2020 0349   CREATININE 0.91 12/31/2020 0349   CREATININE 1.04 12/15/2020 0847   CALCIUM 8.1 (L) 12/31/2020 0349   GFRNONAA >60 12/31/2020 0349   GFRNONAA >60 12/15/2020 2595  BNP    Component Value Date/Time   BNP 17.9 12/22/2020 1000    ProBNP No results found for: PROBNP  Imaging: DG Chest 2 View  Result Date: 01/12/2021 CLINICAL DATA:  Drug induced pneumonitis. EXAM: CHEST - 2 VIEW COMPARISON:  January 01, 2021. FINDINGS: The heart size and mediastinal contours are within normal limits. Stable coarse interstitial densities are noted throughout both lungs consistent with chronic interstitial lung disease or fibrosis, although acute superimposed edema or inflammation cannot be excluded. The visualized skeletal structures are unremarkable. IMPRESSION: Stable bilateral coarse interstitial densities are noted as described above. Electronically Signed   By: Marijo Conception M.D.   On: 01/12/2021 11:25   DG Chest 2 View  Result Date: 12/30/2020 CLINICAL DATA:  Shortness of breath.  Pneumonia. EXAM: CHEST - 2 VIEW COMPARISON:  Chest x-ray dated December 26, 2020. FINDINGS: Normal heart size. New pneumomediastinum and right chest wall and lower neck subcutaneous emphysema. No pneumothorax. Diffuse interstitial opacity in both lungs is not significantly changed. Similar cavitation in the right mid lung. No pleural effusion. No acute osseous abnormality. IMPRESSION: 1. New pneumomediastinum and right chest wall and lower neck subcutaneous emphysema. No pneumothorax. 2. Unchanged diffuse interstitial opacities in both  lungs which could reflect pneumonitis or multifocal pneumonia. Electronically Signed   By: Titus Dubin M.D.   On: 12/30/2020 12:49   DG Chest 2 View  Result Date: 12/18/2020 CLINICAL DATA:  Stage IV lung cancer. EXAM: CHEST - 2 VIEW COMPARISON:  Radiograph 10/10/2020, CT 822 22 FINDINGS: Normal cardiac silhouette. New streaky airspace consolidation in LEFT lung. Similar dense consolidative pattern in the RIGHT lung with central cavitation measuring 5.5 cm. There is some improvement density within the RIGHT lung about the cavitation. low lung volumes. No acute osseous abnormality. IMPRESSION: 1. New streaky airspace densities in the LEFT lung concerning for multifocal pneumonia versus lung cancer recurrence. 2. Persistent cavitation in the RIGHT mid lung. Some improvement in airspace density about the cavitation. These results will be called to the ordering clinician or representative by the Radiologist Assistant, and communication documented in the PACS or [employer]. Electronically Signed   By: Suzy Bouchard M.D.   On: 12/18/2020 12:07   CT CHEST WO CONTRAST  Result Date: 12/31/2020 CLINICAL DATA:  Crepitus, chest, soft tissue gas suspected; pneumomediastinum, subcutaneous emphysema EXAM: CT CHEST WITHOUT CONTRAST TECHNIQUE: Multidetector CT imaging of the chest was performed following the standard protocol without IV contrast. COMPARISON:  Chest radiograph, 12/30/2020.  CT, 12/22/2020. FINDINGS: Cardiovascular: Heart is normal in size. No pericardial effusion. Mild 2 vessel coronary artery calcifications. Great vessels are normal in caliber. Mild aortic atherosclerosis. Mediastinum/Nodes: Significant pneumomediastinum. There extends throughout the posterior, middle and anterior mediastinum. Pneumomediastinum expands the mediastinal contours, bulging against the medial aspect of the right upper lobe. Subcutaneous emphysema extends from the pneumo mediastinum into the neck base and along the  anterior upper chest and right lateral and posterior chest wall. Thyroid surrounded by soft tissue air, normal in size. No neck base, mediastinal or hilar masses or enlarged lymph nodes. Trachea and esophagus are unremarkable. Lungs/Pleura: Ground-glass and more confluent airspace opacities noted on the previous CT have improved. There is persistent airspace opacity with mild associated bronchiectasis and volume loss in the right upper and lower lobes. This has the appearance of fibrosis. Cystic spaces are noted in the posterior aspect of the right mid to upper lung, unchanged. There is also stable 9 mm right upper lobe nodule. Ground-glass opacities noted  bilaterally are notably improved. Bilateral interstitial thickening is similar to the prior CT. No convincing pneumothorax.  No pleural effusion. Upper Abdomen: Gallbladder is distended with a 1.6 cm stone in the gallbladder neck. No convincing wall thickening or adjacent inflammation, however. Large right adrenal mass, 5.9 x 3.0 x 5.7 cm, unchanged from the prior chest CT. Musculoskeletal: No fracture or acute finding.  No bone lesion. IMPRESSION: 1. Marked pneumomediastinum has developed since the prior chest CT, extending to the neck base soft tissues and right chest wall. There is no associated pneumothorax. Findings are consistent with barotrauma. 2. Improved lung opacities, specifically significant improvement in ground-glass lung opacities compared to the prior CT. More focal opacity with associated mild bronchiectasis and volume loss in the right mid to upper lung is stable, appearance consistent with fibrosis. No new lung abnormalities. 3. Coronary artery calcifications and aortic atherosclerosis. 4. 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 This has increased in size when compared to an abdomen and pelvis CT from 07/18/2020, previous largest transverse dimension 4.5 cm, currently 5.7 cm. Aortic Atherosclerosis (ICD10-I70.0) and Emphysema (ICD10-J43.9).  Electronically Signed   By: Lajean Manes M.D.   On: 12/31/2020 15:03   CT Angio Chest PE W and/or Wo Contrast  Addendum Date: 12/22/2020   ADDENDUM REPORT: 12/22/2020 11:39 ADDENDUM: As mentioned in the body of the report, 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 Electronically Signed   By: Vinnie Langton M.D.   On: 12/22/2020 11:39   Result Date: 12/22/2020 CLINICAL DATA:  60 year old male with history of shortness of breath for 1 week. Evaluate for pulmonary embolism. EXAM: CT ANGIOGRAPHY CHEST WITH CONTRAST TECHNIQUE: Multidetector CT imaging of the chest was performed using the standard protocol during bolus administration of intravenous contrast. Multiplanar CT image reconstructions and MIPs were obtained to evaluate the vascular anatomy. CONTRAST:  58mL OMNIPAQUE IOHEXOL 350 MG/ML SOLN COMPARISON:  Chest CT 10/06/2020. FINDINGS: Cardiovascular: Study is severely limited by a large amount of patient respiratory motion. With these limitations in mind, there is no central, lobar or proximal segmental sized filling defect to clearly indicate the presence of clinically significant pulmonary embolism. Smaller distal segmental and subsegmental sized pulmonary emboli cannot be entirely excluded. Heart size is normal. There is no significant pericardial fluid, thickening or pericardial calcification. There is aortic atherosclerosis, as well as atherosclerosis of the great vessels of the mediastinum and the coronary arteries, including calcified atherosclerotic plaque in the left anterior descending and right coronary arteries. Mediastinum/Nodes: Previously noted prominent but nonenlarged mediastinal lymph nodes appear stable compared to the prior examination. No definite pathologically enlarged mediastinal or hilar lymph nodes are noted. Esophagus is unremarkable in appearance. No axillary lymphadenopathy. Lungs/Pleura: Dense areas of airspace  consolidation persist in the right upper and lower lobes. Interval cavitation of several areas of previously noted airspace consolidation in the right upper lobe posteriorly and in the superior segment of the right lower lobe. Today's study is most notable for widespread areas of ground-glass attenuation, septal thickening and regional areas of architectural distortion which have developed elsewhere in the lungs bilaterally, most evident throughout the mid to upper lungs, concerning for progressive pneumonitis. No definite new suspicious appearing pulmonary nodules or masses are noted. Mild diffuse bronchial wall thickening with mild centrilobular and paraseptal emphysema. Upper Abdomen: Large right adrenal mass increased compared to the prior study, currently measuring 6.4 x 3.7 cm, compatible with a large metastatic lesion. Spleen is incompletely imaged, but appears likely  enlarged measuring at least 15.1 x 8.6 cm on axial images. Peripherally calcified gallstone in the neck of the gallbladder measuring 1.8 cm in diameter. Musculoskeletal: There are no aggressive appearing lytic or blastic lesions noted in the visualized portions of the skeleton. Review of the MIP images confirms the above findings. IMPRESSION: 1. Limited study demonstrating no definite evidence of central, lobar or proximal segmental sized pulmonary embolism. Distal segmental and subsegmental sized emboli are not entirely excluded. 2. Worsening aeration in the lungs bilaterally, the appearance of which is concerning for progressively worsening pneumonitis as can be seen in the setting of immunotherapy (patient is reportedly on Keytruda). Further clinical evaluation is recommended. Severe multilobar bilateral pneumonia could be considered, however, lack of response to antibiotics makes this less likely unless the causative agent is not being effectively treated. 3. Aortic atherosclerosis, in addition to two vessel coronary artery disease. Please  note that although the presence of coronary artery calcium documents the presence of coronary artery disease, the severity of this disease and any potential stenosis cannot be assessed on this non-gated CT examination. Assessment for potential risk factor modification, dietary therapy or pharmacologic therapy may be warranted, if clinically indicated. 4. Mild diffuse bronchial wall thickening with mild centrilobular and paraseptal emphysema; imaging findings suggestive of underlying COPD. These results will be called to the ordering clinician or representative by the Radiologist Assistant, and communication documented in the PACS or [employer]. Aortic Atherosclerosis (ICD10-I70.0) and Emphysema (ICD10-J43.9). Electronically Signed: By: Vinnie Langton M.D. On: 12/22/2020 11:27   DG CHEST PORT 1 VIEW  Result Date: 01/01/2021 CLINICAL DATA:  60 year old male with COPD, sepsis. Pneumomediastinum felt related to barotrauma. EXAM: PORTABLE CHEST 1 VIEW COMPARISON:  Chest CT 12/31/2020 and earlier. FINDINGS: Portable AP semi upright view at 0606 hours. Mildly lower lung volumes. Stable cardiac size and mediastinal contours. Pneumomediastinum with moderate volume right chest wall and subcutaneous gas appears stable since yesterday. Underlying coarse bilateral pulmonary interstitial opacity has mildly improved. There is chronic architectural distortion in the right lung. No pneumothorax or pleural effusion identified. No areas of worsening ventilation. Stable visualized osseous structures. IMPRESSION: 1. Stable pneumomediastinum and subcutaneous gas since yesterday. 2. Mildly improved ventilation since yesterday. No pneumothorax or new cardiopulmonary abnormality. Electronically Signed   By: Genevie Ann M.D.   On: 01/01/2021 06:24   DG CHEST PORT 1 VIEW  Result Date: 12/26/2020 CLINICAL DATA:  Small cell lung cancer, possible pneumonitis EXAM: PORTABLE CHEST 1 VIEW COMPARISON:  Prior chest x-ray 12/24/2020  FINDINGS: Stable cardiac and mediastinal contours. No significant interval change in the appearance of the chest. Extensive interstitial opacities bilaterally more general throughout the left lung and more focal on the right in the mid and upper lung. No new airspace infiltrate, pleural effusion or pneumothorax. Bullous changes in the right mid lung again noted. IMPRESSION: Stable appearance of the chest. No significant interval change compared to 12/24/2020. Persistent extensive interstitial airspace opacities throughout the left lung and primarily in the right upper lung. Electronically Signed   By: Jacqulynn Cadet M.D.   On: 12/26/2020 06:31   DG CHEST PORT 1 VIEW  Result Date: 12/24/2020 CLINICAL DATA:  Pneumonitis, assess for changes. EXAM: PORTABLE CHEST 1 VIEW COMPARISON:  December 22, 2020. FINDINGS: Trachea midline. Cardiomediastinal contours and hilar structures are stable. Persistent interstitial and alveolar opacities superimposed on cystic changes in the RIGHT chest. Stable appearance of the chest since previous imaging. On limited assessment no acute skeletal process. IMPRESSION: Stable low lung volumes  with diffuse interstitial and alveolar opacities with cystic and or cavitary changes in the RIGHT chest. Electronically Signed   By: Zetta Bills M.D.   On: 12/24/2020 09:57   DG Chest Port 1 View  Result Date: 12/22/2020 CLINICAL DATA:  Shortness of breath, hypoxia EXAM: PORTABLE CHEST 1 VIEW COMPARISON:  12/18/2020 FINDINGS: No significant change in AP portable chest radiographs with diffuse bilateral interstitial heterogeneous airspace opacity, as well as a cavitary lesion of the right hand. No new airspace opacity. The heart and mediastinum are unremarkable. IMPRESSION: No significant change in AP portable chest radiographs with diffuse bilateral interstitial heterogeneous airspace opacity, as well as a cavitary lesion of the right hand. No new airspace opacity. Electronically Signed    By: Delanna Ahmadi M.D.   On: 12/22/2020 10:22     Assessment & Plan:   Drug-induced pneumonitis - Admitted from 12/22/20-01/01/21 for drug induced pneumonitis likely secondary to immunotherapy treatment with [place]. Discharged on 6 week taper prednisone (currently on 30mg  daily x 2 weeks, then decrease 20mg  x 2 days and 10mg  x 2 weeks). He is feeling a great deal better. Still has some exertional dyspnea and requiring 2L oxygen with ambulation. Repeat CXR today showed stable bilateral coarse interstitial densities. He has follow-up with Dr. [name] in December.   Chronic respiratory failure with hypoxia ([facility]) - O2 low 84% after walking 282ft on room air, requiring 2L to maintain O2 >90%  - Advised patient to continue to wear 2L supplemental oxygen with any exertion and at night - We will placed DME order for best fit evaluation for POC   Malignant neoplasm of lung ([place]) - Patient following with [name], he has an upcoming apt this week to discuss other treatment options   COPD with asthma ([place]) - Continue Anoro 1 puff daily and levalbuterol hfa 2 puffs every 6 hours as needed of breakthrough shortness of breath/wheezing   40 mins spent on case; > 50% face to face with patient  Martyn Ehrich, NP 01/12/2021

## 2021-01-12 NOTE — Assessment & Plan Note (Signed)
-   Admitted from 12/22/20-01/01/21 for drug induced pneumonitis likely secondary to immunotherapy treatment with [place]. Discharged on 6 week taper prednisone (currently on 30mg  daily x 2 weeks, then decrease 20mg  x 2 days and 10mg  x 2 weeks). He is feeling a great deal better. Still has some exertional dyspnea and requiring 2L oxygen with ambulation. Repeat CXR today showed stable bilateral coarse interstitial densities. He has follow-up with Dr. [name] in December.

## 2021-01-12 NOTE — Assessment & Plan Note (Addendum)
-   O2 low 84% after walking 268ft on room air, requiring 2L to maintain O2 >90%  - Advised patient to continue to wear 2L supplemental oxygen with any exertion and at night - We will placed DME order for best fit evaluation for POC

## 2021-01-12 NOTE — Assessment & Plan Note (Addendum)
-   Continue Anoro 1 puff daily and levalbuterol hfa 2 puffs every 6 hours as needed of breakthrough shortness of breath/wheezing

## 2021-01-12 NOTE — Progress Notes (Signed)
Please let patient know his CXR showed stable interstitial lung disease. No change to plan

## 2021-01-12 NOTE — Assessment & Plan Note (Signed)
-   Patient following with [name], he has an upcoming apt this week to discuss other treatment options

## 2021-01-12 NOTE — Patient Instructions (Addendum)
Recommendations: Use incentive spirometer 5-10 deep breaths every hour while awake Use 2L oxygen when ambulating >279feet and at night Continue prednisone taper 30mg  x 2 weeks; then 20mg  x 2 weeks and 10mg  x 2 week We put a ticket in with [employer] to have them pick up Oxygen take   Orders: Incentive spirometer (ordered) CXR (ordered) Best fit for portable oxygen concentrator   Follow-up: With Dr. [name] as scheduled   Incentive Spirometer Record Use your incentive spirometer as told by your health care provider. Your health care provider or respiratory therapist will help you set a goal. Breathe in slowly and as deeply as you can through your mouth. This will cause the piston or the ball to rise toward the top of the chamber. Try to reach the goal marker set on the spirometer. Use this form to write down your progress. Use additional notebook paper, if needed. Bring this form with you to your follow-up visits. My incentive spirometer goal is to reach ____________________. Date __________ Time __________ Amount reached __________ Date __________ Time __________ Amount reached __________ Date __________ Time __________ Amount reached __________ Date __________ Time __________ Amount reached __________ Date __________ Time __________ Amount reached __________ Date __________ Time __________ Amount reached __________ Date __________ Time __________ Amount reached __________ Date __________ Time __________ Amount reached __________ Date __________ Time __________ Amount reached __________ Date __________ Time __________ Amount reached __________ Date __________ Time __________ Amount reached __________ Date __________ Time __________ Amount reached __________ Date __________ Time __________ Amount reached __________ Date __________ Time __________ Amount reached __________ Date __________ Time __________ Amount reached __________ Date __________ Time __________ Amount reached __________ Date  __________ Time __________ Amount reached __________ Date __________ Time __________ Amount reached __________ Date __________ Time __________ Amount reached __________ Date __________ Time __________ Amount reached __________ Date __________ Time __________ Amount reached __________ Date __________ Time __________ Amount reached __________ Date __________ Time __________ Amount reached __________ Date __________ Time __________ Amount reached __________ Date __________ Time __________ Amount reached __________ Date __________ Time __________ Amount reached __________ This information is not intended to replace advice given to you by your health care provider. Make sure you discuss any questions you have with your health care provider. Document Revised: 04/23/2019 Document Reviewed: 04/23/2019 Elsevier Patient Education  [place].

## 2021-01-14 ENCOUNTER — Inpatient Hospital Stay: Payer: 59 | Attending: Internal Medicine

## 2021-01-14 ENCOUNTER — Inpatient Hospital Stay (HOSPITAL_BASED_OUTPATIENT_CLINIC_OR_DEPARTMENT_OTHER): Payer: 59 | Admitting: Hematology & Oncology

## 2021-01-14 ENCOUNTER — Encounter: Payer: Self-pay | Admitting: Hematology & Oncology

## 2021-01-14 ENCOUNTER — Other Ambulatory Visit: Payer: Self-pay

## 2021-01-14 VITALS — BP 105/69 | HR 82 | Temp 99.1°F | Resp 24 | Wt 207.0 lb

## 2021-01-14 DIAGNOSIS — C7970 Secondary malignant neoplasm of unspecified adrenal gland: Secondary | ICD-10-CM | POA: Insufficient documentation

## 2021-01-14 DIAGNOSIS — C3411 Malignant neoplasm of upper lobe, right bronchus or lung: Secondary | ICD-10-CM | POA: Insufficient documentation

## 2021-01-14 DIAGNOSIS — C3491 Malignant neoplasm of unspecified part of right bronchus or lung: Secondary | ICD-10-CM

## 2021-01-14 LAB — CMP (CANCER CENTER ONLY)
ALT: 27 U/L (ref 0–44)
AST: 14 U/L — ABNORMAL LOW (ref 15–41)
Albumin: 3.5 g/dL (ref 3.5–5.0)
Alkaline Phosphatase: 73 U/L (ref 38–126)
Anion gap: 8 (ref 5–15)
BUN: 15 mg/dL (ref 6–20)
CO2: 30 mmol/L (ref 22–32)
Calcium: 9.4 mg/dL (ref 8.9–10.3)
Chloride: 99 mmol/L (ref 98–111)
Creatinine: 1.05 mg/dL (ref 0.61–1.24)
GFR, Estimated: >60 mL/min (ref >60)
Glucose, Bld: 86 mg/dL (ref 70–99)
Potassium: 4.2 mmol/L (ref 3.5–5.1)
Sodium: 137 mmol/L (ref 135–145)
Total Bilirubin: 0.9 mg/dL (ref 0.3–1.2)
Total Protein: 5.9 g/dL — ABNORMAL LOW (ref 6.5–8.1)

## 2021-01-14 LAB — CBC WITH DIFFERENTIAL (CANCER CENTER ONLY)
Abs Immature Granulocytes: 0.01 10*3/uL (ref 0.00–0.07)
Basophils Absolute: 0 10*3/uL (ref 0.0–0.1)
Basophils Relative: 1 %
Eosinophils Absolute: 0.2 10*3/uL (ref 0.0–0.5)
Eosinophils Relative: 5 %
HCT: 39.9 % (ref 39.0–52.0)
Hemoglobin: 13.4 g/dL (ref 13.0–17.0)
Immature Granulocytes: 0 %
Lymphocytes Relative: 27 %
Lymphs Abs: 0.8 10*3/uL (ref 0.7–4.0)
MCH: 29.4 pg (ref 26.0–34.0)
MCHC: 33.6 g/dL (ref 30.0–36.0)
MCV: 87.5 fL (ref 80.0–100.0)
Monocytes Absolute: 0.5 10*3/uL (ref 0.1–1.0)
Monocytes Relative: 19 %
Neutro Abs: 1.4 10*3/uL — ABNORMAL LOW (ref 1.7–7.7)
Neutrophils Relative %: 48 %
Platelet Count: 145 10*3/uL — ABNORMAL LOW (ref 150–400)
RBC: 4.56 MIL/uL (ref 4.22–5.81)
RDW: 16.6 % — ABNORMAL HIGH (ref 11.5–15.5)
WBC Count: 2.9 10*3/uL — ABNORMAL LOW (ref 4.0–10.5)
nRBC: 0 % (ref 0.0–0.2)

## 2021-01-14 LAB — LACTATE DEHYDROGENASE: LDH: 226 U/L — ABNORMAL HIGH (ref 98–192)

## 2021-01-14 LAB — TSH: TSH: 2.716 u[IU]/mL (ref 0.320–4.118)

## 2021-01-14 NOTE — Progress Notes (Signed)
Hematology and Oncology Follow Up Visit  Jafari Mckillop [id number] [birth year] 60 y.o. 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 Hanzlik is here today for follow-up.  He had a fairly lengthy hospital stay because of significant pulmonary interstitial pneumonitis.  Ultimately this was from the pembrolizumab that he was on.  We have on high-dose steroids that have been slowly tapered down.  He has been improving.  He still wears oxygen on occasion.  He had a chest x-ray a couple days ago.  This showed some stable interstitial type changes.  He is also on amiodarone.  I do not know if this would be a factor with respect to the pulmonary changes.  He did have scans done while in the hospital.  Unfortunately it was found that his tumor was progressing.  He did have an enlarging adrenal mass.  Again we will get have to make a change with respect to his treatment.  We will have to get him back onto chemotherapy.  We can try him on Taxotere.  I think this would be reasonable.  His appetite has been good.  He had a good Thanksgiving.  He has been eating well.  He has not had any problems with bleeding.  Currently, his performance status is ECOG 1.   Medications:  Allergies as of 01/14/2021       Reactions   Albuterol Other (See Comments)   Patient has had episode of atrial fib following administration of albuterol (tolerates Xopenex well)        Medication List        Accurate as of January 14, 2021  4:46 PM. If you have any questions, ask your nurse or doctor.          amiodarone 200 MG tablet Commonly known as: PACERONE Take 1 tablet (200 mg total) by mouth daily.   apixaban 5 MG Tabs tablet Commonly known as: Eliquis Take 1 tablet (5 mg total) by  mouth 2 (two) times daily.   atorvastatin 10 MG tablet Commonly known as: LIPITOR Take 1 tablet (10 mg total) by mouth daily.   levalbuterol 0.63 MG/3ML nebulizer solution Commonly known as: XOPENEX Take 3 mLs (0.63 mg total) by nebulization every 8 (eight) hours as needed for wheezing or shortness of breath.   levalbuterol 45 MCG/ACT inhaler Commonly known as: Xopenex HFA Inhale 2 puffs into the lungs every 4 (four) hours as needed for wheezing.   metoprolol succinate 25 MG 24 hr tablet Commonly known as: Toprol XL Take one tablet by mouth daily   PARoxetine 10 MG tablet Commonly known as: PAXIL Take 10 mg by mouth daily.   predniSONE 5 MG tablet Commonly known as: DELTASONE Take 5 mg by mouth daily. 01/14/2021 32m daily x 2 weeks start 01/12/21, then 274mdaily x 2 weeks, then 1015maily x 2 weeks then 5mg5mily.   PriLOSEC OTC 20 MG tablet Generic drug: omeprazole Take 20 mg by mouth at bedtime.   umeclidinium-vilanterol 62.5-25 MCG/ACT Aepb Commonly known as: ANORO ELLIPTA Inhale 1 puff into the lungs daily.        Allergies:  Allergies  Allergen Reactions   Albuterol Other (See Comments)    Patient has had episode of atrial fib  following administration of albuterol (tolerates Xopenex well)    Past Medical History, Surgical history, Social history, and Family History were reviewed and updated.  Review of Systems: Review of Systems  Constitutional:  Positive for malaise/fatigue.  HENT: Negative.    Eyes: Negative.   Respiratory:  Positive for cough and shortness of breath.   Cardiovascular:  Positive for chest pain.  Gastrointestinal: Negative.   Genitourinary: Negative.   Musculoskeletal:  Positive for joint pain.  Skin: Negative.   Neurological:  Positive for focal weakness.  Endo/Heme/Allergies: Negative.   Psychiatric/Behavioral: Negative.      Physical Exam:  weight is 207 lb (93.9 kg). His oral temperature is 99.1 F (37.3 C). His blood  pressure is 105/69 and his pulse is 82. His respiration is 24 (abnormal) and oxygen saturation is 96%.   Wt Readings from Last 3 Encounters:  01/14/21 207 lb (93.9 kg)  01/12/21 205 lb 3.2 oz (93.1 kg)  01/05/21 204 lb 12.8 oz (92.9 kg)    Physical Exam Vitals reviewed.  HENT:     Head: Normocephalic and atraumatic.  Eyes:     Pupils: Pupils are equal, round, and reactive to light.  Cardiovascular:     Rate and Rhythm: Normal rate and regular rhythm.     Heart sounds: Normal heart sounds.  Pulmonary:     Effort: Pulmonary effort is normal.     Breath sounds: Normal breath sounds.  Abdominal:     General: Bowel sounds are normal.     Palpations: Abdomen is soft.  Musculoskeletal:        General: No tenderness or deformity. Normal range of motion.     Cervical back: Normal range of motion.  Lymphadenopathy:     Cervical: No cervical adenopathy.  Skin:    General: Skin is warm and dry.     Findings: No erythema or rash.  Neurological:     Mental Status: He is alert and oriented to person, place, and time.  Psychiatric:        Behavior: Behavior normal.        Thought Content: Thought content normal.        Judgment: Judgment normal.    Lab Results  Component Value Date   WBC 2.9 (L) 01/14/2021   HGB 13.4 01/14/2021   HCT 39.9 01/14/2021   MCV 87.5 01/14/2021   PLT 145 (L) 01/14/2021   No results found for: FERRITIN, IRON, TIBC, UIBC, IRONPCTSAT Lab Results  Component Value Date   RBC 4.56 01/14/2021   No results found for: KPAFRELGTCHN, LAMBDASER, KAPLAMBRATIO No results found for: IGGSERUM, IGA, IGMSERUM No results found for: Odetta Pink, SPEI   Chemistry      Component Value Date/Time   NA 137 01/14/2021 0907   K 4.2 01/14/2021 0907   CL 99 01/14/2021 0907   CO2 30 01/14/2021 0907   BUN 15 01/14/2021 0907   CREATININE 1.05 01/14/2021 0907      Component Value Date/Time   CALCIUM 9.4 01/14/2021 0907    ALKPHOS 73 01/14/2021 0907   AST 14 (L) 01/14/2021 0907   ALT 27 01/14/2021 0907   BILITOT 0.9 01/14/2021 0907       Impression and Plan: Mr. Rayner is a very pleasant 60 yo gentleman with metastatic squamous cell carcinoma of the right lung. Tumor had high PD-L1 score.   Again, we will have to make a change with his protocol.  Hopefully, he will do okay with  Taxotere.  I does want to make sure that we focus on his quality of life.  Hopefully his lungs will continue to improve.  We will see about starting treatment in a couple weeks.  Hopefully this will allow his lungs to continue to heal up a little bit more.  I went over the side effects of treatment with him.  I explained about the low white cell count.  His white cell count is already low, likely secondary to rheumatoid arthritis.  We will just see how things look in a couple weeks.  Hopefully, we will be able to treat.  Volanda Napoleon, MD 11/30/20224:46 PM

## 2021-01-14 NOTE — Progress Notes (Signed)
DISCONTINUE ON PATHWAY REGIMEN - Non-Small Cell Lung     A cycle is every 21 days:     Pembrolizumab      Paclitaxel      Carboplatin   **Always confirm dose/schedule in your pharmacy ordering system**  REASON: Disease Progression PRIOR TREATMENT: [id number]: Pembrolizumab 200 mg + Carboplatin AUC=6 + Paclitaxel 200 mg/m2 q21 Days x 4 Cycles TREATMENT RESPONSE: Partial Response (PR)  START OFF PATHWAY REGIMEN - Non-Small Cell Lung   [id number]:Docetaxel 75 mg/m2:   A cycle is every 21 days:     Docetaxel   **Always confirm dose/schedule in your pharmacy ordering system**  Patient Characteristics: Stage IV Metastatic, Squamous, Awaiting Molecular Test Results and Need to Start Chemotherapy, PS = 0, 1 Therapeutic Status: Stage IV Metastatic Histology: Squamous Cell ECOG Performance Status: 1 Intent of Therapy: Non-Curative / Palliative Intent, Discussed with Patient

## 2021-01-15 ENCOUNTER — Other Ambulatory Visit: Payer: Self-pay | Admitting: Pharmacist

## 2021-01-15 NOTE — Addendum Note (Signed)
Addended by: Arbutus Ped on: 01/15/2021 11:26 AM   Modules accepted: Orders

## 2021-01-15 NOTE — Progress Notes (Signed)
Patient's insurance has selected Ziextenzo for use in this patient.   Ellen Henri has been changed to biosimilar product Ziextenzo.

## 2021-01-15 NOTE — Addendum Note (Signed)
Addended by: Vanessa Barbara on: 01/15/2021 05:35 PM   Modules accepted: Orders

## 2021-01-23 NOTE — Progress Notes (Signed)
Pharmacist Chemotherapy Monitoring - Initial Assessment    Anticipated start date: 01/29/21   The following has been reviewed per standard work regarding the patient's treatment regimen: The patient's diagnosis, treatment plan and drug doses, and organ/hematologic function Lab orders and baseline tests specific to treatment regimen  The treatment plan start date, drug sequencing, and pre-medications Prior authorization status  Patient's documented medication list, including drug-drug interaction screen and prescriptions for anti-emetics and supportive care specific to the treatment regimen The drug concentrations, fluid compatibility, administration routes, and timing of the medications to be used The patient's access for treatment and lifetime cumulative dose history, if applicable  The patient's medication allergies and previous infusion related reactions, if applicable   Changes made to treatment plan:  N/A  Follow up needed:  N/A   Claybon Jabs, [facility], 01/23/2021  8:35 AM

## 2021-01-29 ENCOUNTER — Inpatient Hospital Stay: Payer: 59 | Attending: Internal Medicine

## 2021-01-29 ENCOUNTER — Encounter: Payer: Self-pay | Admitting: Emergency Medicine

## 2021-01-29 ENCOUNTER — Encounter: Payer: Self-pay | Admitting: Hematology & Oncology

## 2021-01-29 ENCOUNTER — Ambulatory Visit: Payer: 59 | Admitting: Emergency Medicine

## 2021-01-29 ENCOUNTER — Inpatient Hospital Stay: Payer: 59

## 2021-01-29 ENCOUNTER — Inpatient Hospital Stay (HOSPITAL_BASED_OUTPATIENT_CLINIC_OR_DEPARTMENT_OTHER): Payer: 59 | Admitting: Hematology & Oncology

## 2021-01-29 ENCOUNTER — Other Ambulatory Visit: Payer: Self-pay

## 2021-01-29 VITALS — BP 126/85 | HR 91 | Temp 98.3°F | Resp 20 | Wt 214.0 lb

## 2021-01-29 VITALS — BP 122/68 | HR 75 | Temp 98.5°F | Resp 17

## 2021-01-29 DIAGNOSIS — C779 Secondary and unspecified malignant neoplasm of lymph node, unspecified: Secondary | ICD-10-CM | POA: Insufficient documentation

## 2021-01-29 DIAGNOSIS — Z5111 Encounter for antineoplastic chemotherapy: Secondary | ICD-10-CM | POA: Diagnosis not present

## 2021-01-29 DIAGNOSIS — C3411 Malignant neoplasm of upper lobe, right bronchus or lung: Secondary | ICD-10-CM | POA: Diagnosis present

## 2021-01-29 DIAGNOSIS — C3491 Malignant neoplasm of unspecified part of right bronchus or lung: Secondary | ICD-10-CM

## 2021-01-29 DIAGNOSIS — J9611 Chronic respiratory failure with hypoxia: Secondary | ICD-10-CM

## 2021-01-29 DIAGNOSIS — J449 Chronic obstructive pulmonary disease, unspecified: Secondary | ICD-10-CM

## 2021-01-29 DIAGNOSIS — R0602 Shortness of breath: Secondary | ICD-10-CM

## 2021-01-29 DIAGNOSIS — J189 Pneumonia, unspecified organism: Secondary | ICD-10-CM | POA: Diagnosis not present

## 2021-01-29 DIAGNOSIS — C7970 Secondary malignant neoplasm of unspecified adrenal gland: Secondary | ICD-10-CM | POA: Insufficient documentation

## 2021-01-29 DIAGNOSIS — C3400 Malignant neoplasm of unspecified main bronchus: Secondary | ICD-10-CM

## 2021-01-29 LAB — CBC WITH DIFFERENTIAL (CANCER CENTER ONLY)
Abs Immature Granulocytes: 0.03 10*3/uL (ref 0.00–0.07)
Basophils Absolute: 0 10*3/uL (ref 0.0–0.1)
Basophils Relative: 1 %
Eosinophils Absolute: 0.1 10*3/uL (ref 0.0–0.5)
Eosinophils Relative: 2 %
HCT: 37.9 % — ABNORMAL LOW (ref 39.0–52.0)
Hemoglobin: 12.8 g/dL — ABNORMAL LOW (ref 13.0–17.0)
Immature Granulocytes: 1 %
Lymphocytes Relative: 30 %
Lymphs Abs: 1.1 10*3/uL (ref 0.7–4.0)
MCH: 28.8 pg (ref 26.0–34.0)
MCHC: 33.8 g/dL (ref 30.0–36.0)
MCV: 85.2 fL (ref 80.0–100.0)
Monocytes Absolute: 0.7 10*3/uL (ref 0.1–1.0)
Monocytes Relative: 18 %
Neutro Abs: 1.9 10*3/uL (ref 1.7–7.7)
Neutrophils Relative %: 48 %
Platelet Count: 235 10*3/uL (ref 150–400)
RBC: 4.45 MIL/uL (ref 4.22–5.81)
RDW: 16.4 % — ABNORMAL HIGH (ref 11.5–15.5)
WBC Count: 3.8 10*3/uL — ABNORMAL LOW (ref 4.0–10.5)
nRBC: 0 % (ref 0.0–0.2)

## 2021-01-29 LAB — CMP (CANCER CENTER ONLY)
ALT: 17 U/L (ref 0–44)
AST: 14 U/L — ABNORMAL LOW (ref 15–41)
Albumin: 3.7 g/dL (ref 3.5–5.0)
Alkaline Phosphatase: 78 U/L (ref 38–126)
Anion gap: 10 (ref 5–15)
BUN: 12 mg/dL (ref 6–20)
CO2: 27 mmol/L (ref 22–32)
Calcium: 9.4 mg/dL (ref 8.9–10.3)
Chloride: 101 mmol/L (ref 98–111)
Creatinine: 1.1 mg/dL (ref 0.61–1.24)
GFR, Estimated: >60 mL/min (ref >60)
Glucose, Bld: 86 mg/dL (ref 70–99)
Potassium: 4 mmol/L (ref 3.5–5.1)
Sodium: 138 mmol/L (ref 135–145)
Total Bilirubin: 0.8 mg/dL (ref 0.3–1.2)
Total Protein: 6.7 g/dL (ref 6.5–8.1)

## 2021-01-29 LAB — LACTATE DEHYDROGENASE: LDH: 264 U/L — ABNORMAL HIGH (ref 98–192)

## 2021-01-29 MED ORDER — SODIUM CHLORIDE 0.9% FLUSH: 10.0000 mL | INTRAVENOUS | Status: DC | PRN

## 2021-01-29 MED ORDER — LORAZEPAM 0.5 MG PO TABS: 0.5000 mg | ORAL_TABLET | Freq: Four times a day (QID) | ORAL | 0 refills | Status: DC | PRN

## 2021-01-29 MED ORDER — PROCHLORPERAZINE MALEATE 10 MG PO TABS: 10.0000 mg | ORAL_TABLET | Freq: Four times a day (QID) | ORAL | 1 refills | Status: DC | PRN

## 2021-01-29 MED ORDER — ONDANSETRON HCL 8 MG PO TABS: 8.0000 mg | ORAL_TABLET | Freq: Two times a day (BID) | ORAL | 1 refills | Status: DC | PRN

## 2021-01-29 MED ORDER — SODIUM CHLORIDE 0.9 % IV SOLN: Freq: Once | INTRAVENOUS | Status: AC

## 2021-01-29 MED ORDER — LIDOCAINE-PRILOCAINE 2.5-2.5 % EX CREA: TOPICAL_CREAM | CUTANEOUS | 3 refills | Status: DC

## 2021-01-29 MED ORDER — UMECLIDINIUM-VILANTEROL 62.5-25 MCG/ACT IN AEPB: 1.0000 | INHALATION_SPRAY | Freq: Every day | RESPIRATORY_TRACT | 3 refills | Status: AC

## 2021-01-29 MED ORDER — SODIUM CHLORIDE 0.9 % IV SOLN
60.0000 mg/m2 | Freq: Once | INTRAVENOUS | Status: AC
  Administered 2021-01-29: 130 mg via INTRAVENOUS
  Filled 2021-01-29: qty 13

## 2021-01-29 MED ORDER — DEXAMETHASONE 4 MG PO TABS: 8.0000 mg | ORAL_TABLET | Freq: Two times a day (BID) | ORAL | 1 refills | Status: DC

## 2021-01-29 MED ORDER — SODIUM CHLORIDE 0.9 % IV SOLN
10.0000 mg | Freq: Once | INTRAVENOUS | Status: AC
  Administered 2021-01-29: 10 mg via INTRAVENOUS
  Filled 2021-01-29: qty 10

## 2021-01-29 MED ORDER — HEPARIN SOD (PORK) LOCK FLUSH 100 UNIT/ML IV SOLN: 500.0000 [IU] | Freq: Once | INTRAVENOUS | Status: DC | PRN

## 2021-01-29 MED ORDER — LEVALBUTEROL HCL 0.63 MG/3ML IN NEBU: 0.6300 mg | INHALATION_SOLUTION | Freq: Three times a day (TID) | RESPIRATORY_TRACT | 11 refills | Status: DC | PRN

## 2021-01-29 NOTE — Assessment & Plan Note (Signed)
Overall stable and has not decompensated with the tapering of his prednisone.  Planning to do so slowly.  He is currently on 20 mg of prednisone daily.  We will get him down to 5 mg daily which was his baseline dose when he was being treated for his rheumatoid arthritis.  I will follow-up with him in several weeks to assess status

## 2021-01-29 NOTE — Assessment & Plan Note (Signed)
On chemotherapy, new regimen as per Dr. Antonieta Pert plans.  Follow-up imaging to be planned after treatment.

## 2021-01-29 NOTE — Progress Notes (Signed)
+ Hematology and Oncology Follow Up Visit  John [name] [id number] 13-Sep-1960 60 y.o. 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 here today for follow-up.  We will now get him onto Taxotere.  Unfortunately, for some reason, his insurance company would not allow [place] use growth factors.  He is a bit worried about the Taxotere.  Is worried about the sickness that he had with the first chemotherapy that he had.  I told him that this is totally different.  I would like to hope that he will not have problems with chemotherapy.  He still is wearing oxygen.  It sounds like everything is improving.  His lungs sound great on examination.  He does get tired.  He does get short of breath with exertion.  His appetite has been good.  He has not lost weight.  He has had no bleeding.  There is no diarrhea.  He has had no fever.  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  He has had no rashes.  There has been no leg swelling.  Overall, I would say his performance status is ECOG 1.    Medications:  Allergies as of 01/29/2021       Reactions   Albuterol Other (See Comments)   Patient has had episode of atrial fib following administration of albuterol (tolerates Xopenex well)        Medication List        Accurate as of January 29, 2021  8:09 AM. If you have any questions, ask your nurse or doctor.          amiodarone 200 MG tablet Commonly known as: PACERONE Take 1 tablet (200 mg total) by mouth daily.   apixaban 5 MG Tabs tablet Commonly known as: Eliquis Take 1 tablet (5 mg total) by mouth 2 (two) times daily.    atorvastatin 10 MG tablet Commonly known as: LIPITOR Take 1 tablet (10 mg total) by mouth daily.   levalbuterol 0.63 MG/3ML nebulizer solution Commonly known as: XOPENEX Take 3 mLs (0.63 mg total) by nebulization every 8 (eight) hours as needed for wheezing or shortness of breath.   levalbuterol 45 MCG/ACT inhaler Commonly known as: Xopenex HFA Inhale 2 puffs into the lungs every 4 (four) hours as needed for wheezing.   metoprolol succinate 25 MG 24 hr tablet Commonly known as: Toprol XL Take one tablet by mouth daily   PARoxetine 10 MG tablet Commonly known as: PAXIL Take 10 mg by mouth daily.   predniSONE 5 MG tablet Commonly known as: DELTASONE Take 5 mg by mouth daily. 01/14/2021 378mdaily x 2 weeks start 01/12/21, then 2043maily x 2 weeks, then 73m81mily x 2 weeks then 5mg 74mly.   PriLOSEC OTC 20 MG tablet Generic drug: omeprazole Take 20 mg by mouth at bedtime.   umeclidinium-vilanterol 62.5-25 MCG/ACT Aepb Commonly known as: ANORO ELLIPTA Inhale 1 puff into the lungs daily.        Allergies:  Allergies  Allergen Reactions   Albuterol Other (See Comments)    Patient has had episode of atrial fib following administration  of albuterol (tolerates Xopenex well)    Past Medical History, Surgical history, Social history, and Family History were reviewed and updated.  Review of Systems: Review of Systems  Constitutional:  Positive for malaise/fatigue.  HENT: Negative.    Eyes: Negative.   Respiratory:  Positive for cough and shortness of breath.   Cardiovascular:  Positive for chest pain.  Gastrointestinal: Negative.   Genitourinary: Negative.   Musculoskeletal:  Positive for joint pain.  Skin: Negative.   Neurological:  Positive for focal weakness.  Endo/Heme/Allergies: Negative.   Psychiatric/Behavioral: Negative.      Physical Exam:  vitals were not taken for this visit.   Wt Readings from Last 3 Encounters:  01/14/21 207 lb (93.9 kg)   01/12/21 205 lb 3.2 oz (93.1 kg)  01/05/21 204 lb 12.8 oz (92.9 kg)    Physical Exam Vitals reviewed.  HENT:     Head: Normocephalic and atraumatic.  Eyes:     Pupils: Pupils are equal, round, and reactive to light.  Cardiovascular:     Rate and Rhythm: Normal rate and regular rhythm.     Heart sounds: Normal heart sounds.  Pulmonary:     Effort: Pulmonary effort is normal.     Breath sounds: Normal breath sounds.  Abdominal:     General: Bowel sounds are normal.     Palpations: Abdomen is soft.  Musculoskeletal:        General: No tenderness or deformity. Normal range of motion.     Cervical back: Normal range of motion.  Lymphadenopathy:     Cervical: No cervical adenopathy.  Skin:    General: Skin is warm and dry.     Findings: No erythema or rash.  Neurological:     Mental Status: He is alert and oriented to person, place, and time.  Psychiatric:        Behavior: Behavior normal.        Thought Content: Thought content normal.        Judgment: Judgment normal.    Lab Results  Component Value Date   WBC 3.8 (L) 01/29/2021   HGB 12.8 (L) 01/29/2021   HCT 37.9 (L) 01/29/2021   MCV 85.2 01/29/2021   PLT 235 01/29/2021   No results found for: FERRITIN, IRON, TIBC, UIBC, IRONPCTSAT Lab Results  Component Value Date   RBC 4.45 01/29/2021   No results found for: KPAFRELGTCHN, LAMBDASER, KAPLAMBRATIO No results found for: IGGSERUM, IGA, IGMSERUM No results found for: Odetta Pink, SPEI   Chemistry      Component Value Date/Time   NA 137 01/14/2021 0907   K 4.2 01/14/2021 0907   CL 99 01/14/2021 0907   CO2 30 01/14/2021 0907   BUN 15 01/14/2021 0907   CREATININE 1.05 01/14/2021 0907      Component Value Date/Time   CALCIUM 9.4 01/14/2021 0907   ALKPHOS 73 01/14/2021 0907   AST 14 (L) 01/14/2021 0907   ALT 27 01/14/2021 0907   BILITOT 0.9 01/14/2021 0907       Impression and Plan: John [name] is a  very pleasant 60 yo gentleman with metastatic squamous cell carcinoma of the right lung. Tumor had high PD-L1 score.   We will see how he does with the Taxotere.  I would like to believe that this is going to work.  I will have to do a little bit of a reduced dose just to make sure that he tolerates it well.  We will  go ahead and plan for his first cycle today.  I will then plan to get him back in 3 weeks for his second cycle.  After 3 cycles, I would then repeat his scans and see how everything looks.   Volanda Napoleon, MD 12/15/20228:09 AM

## 2021-01-29 NOTE — Assessment & Plan Note (Signed)
Continue 2 L/min with exertion.  He is try to get a POC.  I think it was ordered at his last visit here.  Will check on it status with DME.

## 2021-01-29 NOTE — Patient Instructions (Addendum)
Continue prednisone taper as directed.  We will plan to get down to a stable baseline of 5 mg daily. Continue your Anoro once daily. Use your Xopenex nebulizer twice a day as you have been doing.  We will modify your prescription to make sure you have 2 boxes monthly (60 vials) Keep your Xopenex inhaler available use 2 puffs if needed for shortness of breath, chest tightness, wheezing. Continue your oxygen at 2 L/min with exertion.  We will check on the status of the portable oxygen concentrator that has been requested. Continue to follow with [name] as planned Follow Dr. [name] in 2 months.  Call sooner if having new problems.

## 2021-01-29 NOTE — Progress Notes (Signed)
Subjective:    Patient ID: John [name], male    DOB: 1960/09/27, 60 y.o.   MRN: [id number]  HPI  ROV 08/28/20 --John [name] is 60 with a history of rheumatoid arthritis, seizures.  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  He has mild obstruction by pulmonary function testing.  I saw him in April and we decided to use Xopenex as needed for dyspnea to see how he tolerates.  I held off on scheduled bronchodilator therapy because he had a recent hospitalization with sepsis, associated atrial fibrillation with RVR. Today he reports that he does still have some intermittent cough. His breathing is better but can still limit him. He is off amiodarone now.  He has never really tried the xopenex yet.  He is currently on [place], planning for a repeat CT chest in the next few months. Tolerating although he has some GGI   CT chest 07/18/20 reviewed by me shows stable 1.1 cm right upper lobe nodule, decrease in size of his posterior cavitary right upper lobe mass, now 5.0 x 3.2 cm, new region of right lower lobe consolidative change, groundglass.   ROV 01/29/2021 --60 year old man with RA, seizures, stage IVa squamous cell lung cancer of the right upper lobe, mild obstructive lung disease.  He was admitted in November 2022 for acute hypoxemic respiratory failure with bilateral heterogeneous infiltrates concerning for acute pneumonitis while on Keytruda.  He required a prolonged prednisone taper.  He was seen in our office 01/12/2021 and was clinically improved, on 2 L/min.  He was also started on Anoro post hospitalization.  He has Xopenex to use if needed.  Today he reports that he is feeling OK. He has stable SOB, happens with exertion. He doesn't always use his O2 w exertion. He is on Anoro. He is using xopenex nebs bid.    Review of Systems As per HPI      Objective:   Physical Exam Vitals:    01/29/21 1522  BP: 116/70  Pulse: 72  Temp: 98.1 F (36.7 C)  TempSrc: Oral  SpO2: 92%  Weight: 211 lb (95.7 kg)  Height: 5\' 11"  (1.803 m)   Gen: Pleasant, well-nourished, in no distress,  normal affect  ENT: No lesions,  mouth clear,  oropharynx clear, no postnasal drip  Neck: No JVD, no stridor  Lungs: No use of accessory muscles, few insp R crackles  Cardiovascular: RRR, heart sounds normal, no murmur or gallops, no peripheral edema  Musculoskeletal: No deformities, no cyanosis or clubbing  Neuro: alert, awake, non focal  Skin: Warm, no lesions or rash      Assessment & Plan:  Drug-induced pneumonitis Overall stable and has not decompensated with the tapering of his prednisone.  Planning to do so slowly.  He is currently on 20 mg of prednisone daily.  We will get him down to 5 mg daily which was his baseline dose when he was being treated for his rheumatoid arthritis.  I will follow-up with him in several weeks to assess status  Stage 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 (HCC) On chemotherapy, new regimen as per Dr. Antonieta Pert plans.  Follow-up imaging to be planned after treatment.  COPD with asthma ([place]) Plan to continue his Anoro, Xopenex.  He needs more Xopenex nebulizer vials per month  Continue your Anoro once daily. Use your Xopenex nebulizer twice a day as you have been doing.  We  will modify your prescription to make sure you have 2 boxes monthly (60 vials) Keep your Xopenex inhaler available use 2 puffs if needed for shortness of breath, chest tightness, wheezing.  Chronic respiratory failure with hypoxia (HCC) Continue 2 L/min with exertion.  He is try to get a POC.  I think it was ordered at his last visit here.  Will check on it status with DME.  Time spent 40 minutes  Baltazar Apo, MD, PhD 01/29/2021, 3:49 PM [facility] [phone] or if no answer before 7:00PM call [phone] For any issues after 7:00PM please call  eLink [phone]

## 2021-01-29 NOTE — Patient Instructions (Signed)
[name] [place]  Discharge Instructions: Thank you for choosing [facility] to provide your oncology and hematology care.   If you have a lab appointment with the [facility], please go directly to the [facility] and check in at the registration area.  Wear comfortable clothing and clothing appropriate for easy access to any Portacath or PICC line.   We strive to give you quality time with your provider. You may need to reschedule your appointment if you arrive late (15 or more minutes).  Arriving late affects you and other patients whose appointments are after yours.  Also, if you miss three or more appointments without notifying the office, you may be dismissed from the clinic at the providers discretion.      For prescription refill requests, have your pharmacy contact our office and allow 72 hours for refills to be completed.    Today you received the following chemotherapy and/or immunotherapy agents taxotere       To help prevent nausea and vomiting after your treatment, we encourage you to take your nausea medication as directed.  BELOW ARE SYMPTOMS THAT SHOULD BE REPORTED IMMEDIATELY: *FEVER GREATER THAN 100.4 F (38 C) OR HIGHER *CHILLS OR SWEATING *NAUSEA AND VOMITING THAT IS NOT CONTROLLED WITH YOUR NAUSEA MEDICATION *UNUSUAL SHORTNESS OF BREATH *UNUSUAL BRUISING OR BLEEDING *URINARY PROBLEMS (pain or burning when urinating, or frequent urination) *BOWEL PROBLEMS (unusual diarrhea, constipation, pain near the anus) TENDERNESS IN MOUTH AND THROAT WITH OR WITHOUT PRESENCE OF ULCERS (sore throat, sores in mouth, or a toothache) UNUSUAL RASH, SWELLING OR PAIN  UNUSUAL VAGINAL DISCHARGE OR ITCHING   Items with * indicate a potential emergency and should be followed up as soon as possible or go to the Emergency Department if any problems should occur.  Please show the CHEMOTHERAPY ALERT CARD or IMMUNOTHERAPY ALERT CARD at check-in to the  Emergency Department and triage nurse. Should you have questions after your visit or need to cancel or reschedule your appointment, please contact [facility]  [phone] and follow the prompts.  Office hours are 8:00 a.m. to 4:30 p.m. Monday - Friday. Please note that voicemails left after 4:00 p.m. may not be returned until the following business day.  We are closed weekends and major holidays. You have access to a nurse at all times for urgent questions. Please call the main number to the clinic [phone] and follow the prompts.  For any non-urgent questions, you may also contact your provider using MyChart. We now offer e-Visits for anyone 43 and older to request care online for non-urgent symptoms. For details visit [email or web address].   Also download the MyChart app! Go to the app store, search "MyChart", open the app, select [facility], and log in with your MyChart username and password.  Due to Covid, a mask is required upon entering the hospital/clinic. If you do not have a mask, one will be given to you upon arrival. For doctor visits, patients may have 1 support person aged 23 or older with them. For treatment visits, patients cannot have anyone with them due to current Covid guidelines and our immunocompromised population.  Docetaxel injection What is this medication? DOCETAXEL (doe se TAX el) is a chemotherapy drug. It targets fast dividing cells, like cancer cells, and causes these cells to die. This medicine is used to treat many types of cancers like breast cancer, certain stomach cancers, head and neck cancer, lung cancer, and prostate  cancer. This medicine may be used for other purposes; ask your health care provider or pharmacist if you have questions. COMMON BRAND NAME(S): Docefrez, Taxotere What should I tell my care team before I take this medication? They need to know if you have any of these conditions: infection (especially a virus  infection such as chickenpox, cold sores, or herpes) liver disease low blood counts, like low white cell, platelet, or red cell counts an unusual or allergic reaction to docetaxel, polysorbate 80, other chemotherapy agents, other medicines, foods, dyes, or preservatives pregnant or trying to get pregnant breast-feeding How should I use this medication? This drug is given as an infusion into a vein. It is administered in a hospital or clinic by a specially trained health care professional. Talk to your pediatrician regarding the use of this medicine in children. Special care may be needed. Overdosage: If you think you have taken too much of this medicine contact a poison control center or emergency room at once. NOTE: This medicine is only for you. Do not share this medicine with others. What if I miss a dose? It is important not to miss your dose. Call your doctor or health care professional if you are unable to keep an appointment. What may interact with this medication? Do not take this medicine with any of the following medications: live virus vaccines This medicine may also interact with the following medications: aprepitant certain antibiotics like erythromycin or clarithromycin certain antivirals for HIV or hepatitis certain medicines for fungal infections like fluconazole, itraconazole, ketoconazole, posaconazole, or voriconazole cimetidine ciprofloxacin conivaptan cyclosporine dronedarone fluvoxamine grapefruit juice imatinib verapamil This list may not describe all possible interactions. Give your health care provider a list of all the medicines, herbs, non-prescription drugs, or dietary supplements you use. Also tell them if you smoke, drink alcohol, or use illegal drugs. Some items may interact with your medicine. What should I watch for while using this medication? Your condition will be monitored carefully while you are receiving this medicine. You will need important  blood work done while you are taking this medicine. Call your doctor or health care professional for advice if you get a fever, chills or sore throat, or other symptoms of a cold or flu. Do not treat yourself. This drug decreases your body's ability to fight infections. Try to avoid being around people who are sick. Some products may contain alcohol. Ask your health care professional if this medicine contains alcohol. Be sure to tell all health care professionals you are taking this medicine. Certain medicines, like metronidazole and disulfiram, can cause an unpleasant reaction when taken with alcohol. The reaction includes flushing, headache, nausea, vomiting, sweating, and increased thirst. The reaction can last from 30 minutes to several hours. You may get drowsy or dizzy. Do not drive, use machinery, or do anything that needs mental alertness until you know how this medicine affects you. Do not stand or sit up quickly, especially if you are an older patient. This reduces the risk of dizzy or fainting spells. Alcohol may interfere with the effect of this medicine. Talk to your health care professional about your risk of cancer. You may be more at risk for certain types of cancer if you take this medicine. Do not become pregnant while taking this medicine or for 6 months after stopping it. Women should inform their doctor if they wish to become pregnant or think they might be pregnant. There is a potential for serious side effects to an unborn child. Talk  to your health care professional or pharmacist for more information. Do not breast-feed an infant while taking this medicine or for 1 week after stopping it. Males who get this medicine must use a condom during sex with females who can get pregnant. If you get a woman pregnant, the baby could have birth defects. The baby could die before they are born. You will need to continue wearing a condom for 3 months after stopping the medicine. Tell your health care  provider right away if your partner becomes pregnant while you are taking this medicine. This may interfere with the ability to father a child. You should talk to your doctor or health care professional if you are concerned about your fertility. What side effects may I notice from receiving this medication? Side effects that you should report to your doctor or health care professional as soon as possible: allergic reactions like skin rash, itching or hives, swelling of the face, lips, or tongue blurred vision breathing problems changes in vision low blood counts - This drug may decrease the number of white blood cells, red blood cells and platelets. You may be at increased risk for infections and bleeding. nausea and vomiting pain, redness or irritation at site where injected pain, tingling, numbness in the hands or feet redness, blistering, peeling, or loosening of the skin, including inside the mouth signs of decreased platelets or bleeding - bruising, pinpoint red spots on the skin, black, tarry stools, nosebleeds signs of decreased red blood cells - unusually weak or tired, fainting spells, lightheadedness signs of infection - fever or chills, cough, sore throat, pain or difficulty passing urine swelling of the ankle, feet, hands Side effects that usually do not require medical attention (report to your doctor or health care professional if they continue or are bothersome): constipation diarrhea fingernail or toenail changes hair loss loss of appetite mouth sores muscle pain This list may not describe all possible side effects. Call your doctor for medical advice about side effects. You may report side effects to FDA at 1-800-FDA-1088. Where should I keep my medication? This drug is given in a hospital or clinic and will not be stored at home. NOTE: This sheet is a summary. It may not cover all possible information. If you have questions about this medicine, talk to your doctor,  pharmacist, or health care provider.  2022 Elsevier/Gold Standard (2020-10-21 00:00:00)

## 2021-01-29 NOTE — Assessment & Plan Note (Signed)
Plan to continue his Anoro, Xopenex.  He needs more Xopenex nebulizer vials per month  Continue your Anoro once daily. Use your Xopenex nebulizer twice a day as you have been doing.  We will modify your prescription to make sure you have 2 boxes monthly (60 vials) Keep your Xopenex inhaler available use 2 puffs if needed for shortness of breath, chest tightness, wheezing.

## 2021-01-29 NOTE — Addendum Note (Signed)
Addended by: Rosana Berger on: 01/29/2021 04:17 PM   Modules accepted: Orders

## 2021-02-19 ENCOUNTER — Encounter: Payer: Self-pay | Admitting: Hematology & Oncology

## 2021-02-19 ENCOUNTER — Inpatient Hospital Stay (HOSPITAL_BASED_OUTPATIENT_CLINIC_OR_DEPARTMENT_OTHER): Payer: 59 | Admitting: Hematology & Oncology

## 2021-02-19 ENCOUNTER — Other Ambulatory Visit: Payer: Self-pay

## 2021-02-19 ENCOUNTER — Inpatient Hospital Stay: Payer: 59

## 2021-02-19 ENCOUNTER — Inpatient Hospital Stay: Payer: 59 | Attending: Internal Medicine

## 2021-02-19 ENCOUNTER — Other Ambulatory Visit: Payer: 59

## 2021-02-19 VITALS — BP 122/74 | HR 86 | Temp 98.0°F | Resp 20 | Wt 224.0 lb

## 2021-02-19 DIAGNOSIS — C3491 Malignant neoplasm of unspecified part of right bronchus or lung: Secondary | ICD-10-CM

## 2021-02-19 DIAGNOSIS — C7970 Secondary malignant neoplasm of unspecified adrenal gland: Secondary | ICD-10-CM | POA: Insufficient documentation

## 2021-02-19 DIAGNOSIS — Z5111 Encounter for antineoplastic chemotherapy: Secondary | ICD-10-CM | POA: Insufficient documentation

## 2021-02-19 DIAGNOSIS — C3400 Malignant neoplasm of unspecified main bronchus: Secondary | ICD-10-CM

## 2021-02-19 DIAGNOSIS — C3411 Malignant neoplasm of upper lobe, right bronchus or lung: Secondary | ICD-10-CM | POA: Diagnosis present

## 2021-02-19 LAB — CBC WITH DIFFERENTIAL (CANCER CENTER ONLY)
Abs Immature Granulocytes: 0.11 10*3/uL — ABNORMAL HIGH (ref 0.00–0.07)
Basophils Absolute: 0 10*3/uL (ref 0.0–0.1)
Basophils Relative: 0 %
Eosinophils Absolute: 0 10*3/uL (ref 0.0–0.5)
Eosinophils Relative: 0 %
HCT: 34.5 % — ABNORMAL LOW (ref 39.0–52.0)
Hemoglobin: 11.4 g/dL — ABNORMAL LOW (ref 13.0–17.0)
Immature Granulocytes: 1 %
Lymphocytes Relative: 8 %
Lymphs Abs: 0.7 10*3/uL (ref 0.7–4.0)
MCH: 28.2 pg (ref 26.0–34.0)
MCHC: 33 g/dL (ref 30.0–36.0)
MCV: 85.4 fL (ref 80.0–100.0)
Monocytes Absolute: 0.4 10*3/uL (ref 0.1–1.0)
Monocytes Relative: 5 %
Neutro Abs: 7.4 10*3/uL (ref 1.7–7.7)
Neutrophils Relative %: 86 %
Platelet Count: 391 10*3/uL (ref 150–400)
RBC: 4.04 MIL/uL — ABNORMAL LOW (ref 4.22–5.81)
RDW: 17.3 % — ABNORMAL HIGH (ref 11.5–15.5)
WBC Count: 8.6 10*3/uL (ref 4.0–10.5)
nRBC: 0.2 % (ref 0.0–0.2)

## 2021-02-19 LAB — CMP (CANCER CENTER ONLY)
ALT: 13 U/L (ref 0–44)
AST: 10 U/L — ABNORMAL LOW (ref 15–41)
Albumin: 3.8 g/dL (ref 3.5–5.0)
Alkaline Phosphatase: 86 U/L (ref 38–126)
Anion gap: 12 (ref 5–15)
BUN: 11 mg/dL (ref 6–20)
CO2: 26 mmol/L (ref 22–32)
Calcium: 10 mg/dL (ref 8.9–10.3)
Chloride: 101 mmol/L (ref 98–111)
Creatinine: 1.04 mg/dL (ref 0.61–1.24)
GFR, Estimated: >60 mL/min (ref >60)
Glucose, Bld: 157 mg/dL — ABNORMAL HIGH (ref 70–99)
Potassium: 4.6 mmol/L (ref 3.5–5.1)
Sodium: 139 mmol/L (ref 135–145)
Total Bilirubin: 0.6 mg/dL (ref 0.3–1.2)
Total Protein: 6.6 g/dL (ref 6.5–8.1)

## 2021-02-19 LAB — LACTATE DEHYDROGENASE: LDH: 257 U/L — ABNORMAL HIGH (ref 98–192)

## 2021-02-19 LAB — SAVE SMEAR(SSMR), FOR PROVIDER SLIDE REVIEW

## 2021-02-19 MED ORDER — SODIUM CHLORIDE 0.9 % IV SOLN
10.0000 mg | Freq: Once | INTRAVENOUS | Status: AC
  Administered 2021-02-19: 10 mg via INTRAVENOUS
  Filled 2021-02-19: qty 10

## 2021-02-19 MED ORDER — SODIUM CHLORIDE 0.9 % IV SOLN: Freq: Once | INTRAVENOUS | Status: AC

## 2021-02-19 MED ORDER — SODIUM CHLORIDE 0.9 % IV SOLN
60.0000 mg/m2 | Freq: Once | INTRAVENOUS | Status: AC
  Administered 2021-02-19: 130 mg via INTRAVENOUS
  Filled 2021-02-19: qty 13

## 2021-02-19 MED ORDER — HEPARIN SOD (PORK) LOCK FLUSH 100 UNIT/ML IV SOLN: 500.0000 [IU] | Freq: Once | INTRAVENOUS | Status: DC | PRN

## 2021-02-19 MED ORDER — SODIUM CHLORIDE 0.9% FLUSH: 10.0000 mL | INTRAVENOUS | Status: DC | PRN

## 2021-02-19 NOTE — Patient Instructions (Signed)
[facility]  Discharge Instructions: Thank you for choosing [facility] to provide your oncology and hematology care.   If you have a lab appointment with the [facility], please go directly to the [facility] and check in at the registration area.  Wear comfortable clothing and clothing appropriate for easy access to any Portacath or PICC line.   We strive to give you quality time with your provider. You may need to reschedule your appointment if you arrive late (15 or more minutes).  Arriving late affects you and other patients whose appointments are after yours.  Also, if you miss three or more appointments without notifying the office, you may be dismissed from the clinic at the providers discretion.      For prescription refill requests, have your pharmacy contact our office and allow 72 hours for refills to be completed.    Today you received the following chemotherapy and/or immunotherapy agents Taxotere      To help prevent nausea and vomiting after your treatment, we encourage you to take your nausea medication as directed.  BELOW ARE SYMPTOMS THAT SHOULD BE REPORTED IMMEDIATELY: *FEVER GREATER THAN 100.4 F (38 C) OR HIGHER *CHILLS OR SWEATING *NAUSEA AND VOMITING THAT IS NOT CONTROLLED WITH YOUR NAUSEA MEDICATION *UNUSUAL SHORTNESS OF BREATH *UNUSUAL BRUISING OR BLEEDING *URINARY PROBLEMS (pain or burning when urinating, or frequent urination) *BOWEL PROBLEMS (unusual diarrhea, constipation, pain near the anus) TENDERNESS IN MOUTH AND THROAT WITH OR WITHOUT PRESENCE OF ULCERS (sore throat, sores in mouth, or a toothache) UNUSUAL RASH, SWELLING OR PAIN  UNUSUAL VAGINAL DISCHARGE OR ITCHING   Items with * indicate a potential emergency and should be followed up as soon as possible or go to the Emergency Department if any problems should occur.  Please show the CHEMOTHERAPY ALERT CARD or IMMUNOTHERAPY ALERT CARD at check-in to the  Emergency Department and triage nurse. Should you have questions after your visit or need to cancel or reschedule your appointment, please contact [facility]  [phone] and follow the prompts.  Office hours are 8:00 a.m. to 4:30 p.m. Monday - Friday. Please note that voicemails left after 4:00 p.m. may not be returned until the following business day.  We are closed weekends and major holidays. You have access to a nurse at all times for urgent questions. Please call the main number to the clinic [phone] and follow the prompts.  For any non-urgent questions, you may also contact your provider using MyChart. We now offer e-Visits for anyone 60 and older to request care online for non-urgent symptoms. For details visit [email or web address].   Also download the MyChart app! Go to the app store, search "MyChart", open the app, select [facility], and log in with your MyChart username and password.  Due to Covid, a mask is required upon entering the hospital/clinic. If you do not have a mask, one will be given to you upon arrival. For doctor visits, patients may have 1 support person aged 66 or older with them. For treatment visits, patients cannot have anyone with them due to current Covid guidelines and our immunocompromised population.

## 2021-02-19 NOTE — Progress Notes (Signed)
Zietenzo refused by pt and per pt insurance does not cover it. Treatment plan D3/C1 completed.

## 2021-02-19 NOTE — Progress Notes (Signed)
+ Hematology and Oncology Follow Up Visit  John [name] [id number] January 09, 1961 61 y.o. 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 MPitcockis here today for follow-up.  He had his first cycle of Taxotere.  He tolerated this quite well.  He is still wearing oxygen.  It sounds like his lungs might be improving.  What is improving his white cell count.  I am incredibly impressed as to how well his white cell count is coming up.  I will know this is from the steroids that he has been taking.  He has had no hemoptysis.  There has been a little bit of a cough.  He has had no nausea or vomiting.  His weight has come up quite nicely.  He did eat quite well over the holidays.  He did not receive any growth factor support after his first cycle of Taxotere.  Again this really did not bother him.  He has had no leg swelling.  He does have some fatigue with exertion.  Overall, I would say his performance status is probably ECOG 1.    Medications:  Allergies as of 02/19/2021       Reactions   Albuterol Other (See Comments)   Patient has had episode of atrial fib following administration of albuterol (tolerates Xopenex well)        Medication List        Accurate as of February 19, 2021  8:18 AM. If you have any questions, ask your nurse or doctor.          amiodarone 200 MG tablet Commonly known as: PACERONE Take 1 tablet (200 mg total) by mouth daily.   apixaban 5 MG Tabs tablet Commonly known as: Eliquis Take 1 tablet (5 mg total) by mouth 2 (two) times daily.   atorvastatin 10 MG tablet Commonly known as: LIPITOR Take 1 tablet (10 mg total) by mouth daily.   dexamethasone 4 MG  tablet Commonly known as: DECADRON Take 2 tablets (8 mg total) by mouth 2 (two) times daily. Start the day before Taxotere. Then daily after chemo for 2 days.   levalbuterol 0.63 MG/3ML nebulizer solution Commonly known as: XOPENEX Take 3 mLs (0.63 mg total) by nebulization every 8 (eight) hours as needed for wheezing or shortness of breath.   levalbuterol 45 MCG/ACT inhaler Commonly known as: Xopenex HFA Inhale 2 puffs into the lungs every 4 (four) hours as needed for wheezing.   lidocaine-prilocaine cream Commonly known as: EMLA Apply to affected area once   LORazepam 0.5 MG tablet Commonly known as: Ativan Take 1 tablet (0.5 mg total) by mouth every 6 (six) hours as needed (Nausea or vomiting).   metoprolol succinate 25 MG 24 hr tablet Commonly known as: Toprol XL Take one tablet by mouth daily   ondansetron 8 MG tablet Commonly known as: Zofran Take 1 tablet (8 mg total) by mouth 2 (two) times daily as needed for refractory nausea / vomiting.   PARoxetine 10 MG tablet Commonly known as: PAXIL Take 10 mg by mouth daily.   predniSONE 5 MG tablet Commonly known as: DELTASONE Take 5 mg by mouth daily. 01/14/2021  63m daily x 2 weeks start 01/12/21, then 247mdaily x 2 weeks, then 1061maily x 2 weeks then 5mg8mily.   PriLOSEC OTC 20 MG tablet Generic drug: omeprazole Take 20 mg by mouth at bedtime.   prochlorperazine 10 MG tablet Commonly known as: COMPAZINE Take 1 tablet (10 mg total) by mouth every 6 (six) hours as needed (Nausea or vomiting).   umeclidinium-vilanterol 62.5-25 MCG/ACT Aepb Commonly known as: ANORO ELLIPTA Inhale 1 puff into the lungs daily.        Allergies:  Allergies  Allergen Reactions   Albuterol Other (See Comments)    Patient has had episode of atrial fib following administration of albuterol (tolerates Xopenex well)    Past Medical History, Surgical history, Social history, and Family History were reviewed and updated.  Review  of Systems: Review of Systems  Constitutional:  Positive for malaise/fatigue.  HENT: Negative.    Eyes: Negative.   Respiratory:  Positive for cough and shortness of breath.   Cardiovascular:  Positive for chest pain.  Gastrointestinal: Negative.   Genitourinary: Negative.   Musculoskeletal:  Positive for joint pain.  Skin: Negative.   Neurological:  Positive for focal weakness.  Endo/Heme/Allergies: Negative.   Psychiatric/Behavioral: Negative.      Physical Exam:  weight is 224 lb (101.6 kg). His oral temperature is 98 F (36.7 C). His blood pressure is 122/74 and his pulse is 86. His respiration is 20 and oxygen saturation is 95%.   Wt Readings from Last 3 Encounters:  02/19/21 224 lb (101.6 kg)  01/29/21 211 lb (95.7 kg)  01/29/21 214 lb (97.1 kg)    Physical Exam Vitals reviewed.  HENT:     Head: Normocephalic and atraumatic.  Eyes:     Pupils: Pupils are equal, round, and reactive to light.  Cardiovascular:     Rate and Rhythm: Normal rate and regular rhythm.     Heart sounds: Normal heart sounds.  Pulmonary:     Effort: Pulmonary effort is normal.     Breath sounds: Normal breath sounds.  Abdominal:     General: Bowel sounds are normal.     Palpations: Abdomen is soft.  Musculoskeletal:        General: No tenderness or deformity. Normal range of motion.     Cervical back: Normal range of motion.  Lymphadenopathy:     Cervical: No cervical adenopathy.  Skin:    General: Skin is warm and dry.     Findings: No erythema or rash.  Neurological:     Mental Status: He is alert and oriented to person, place, and time.  Psychiatric:        Behavior: Behavior normal.        Thought Content: Thought content normal.        Judgment: Judgment normal.    Lab Results  Component Value Date   WBC 8.6 02/19/2021   HGB 11.4 (L) 02/19/2021   HCT 34.5 (L) 02/19/2021   MCV 85.4 02/19/2021   PLT 391 02/19/2021   No results found for: FERRITIN, IRON, TIBC, UIBC,  IRONPCTSAT Lab Results  Component Value Date   RBC 4.04 (L) 02/19/2021   No results found for: KPAFRELGTCHN, LAMBDASER, KAPLAMBRATIO No results found for: IGGSERUM, IGA, IGMSERUM No results found for: TOTARonnald RampGS, A2GS, BETSViolet BaldyPIKE, SPEI   Chemistry      Component Value Date/Time   NA 138 01/29/2021 0755   K 4.0 01/29/2021 0755   CL 101 01/29/2021 0755  CO2 27 01/29/2021 0755   BUN 12 01/29/2021 0755   CREATININE 1.10 01/29/2021 0755      Component Value Date/Time   CALCIUM 9.4 01/29/2021 0755   ALKPHOS 78 01/29/2021 0755   AST 14 (L) 01/29/2021 0755   ALT 17 01/29/2021 0755   BILITOT 0.8 01/29/2021 0755       Impression and Plan: Mr. Witherington is a very pleasant 61 yo gentleman with metastatic squamous cell carcinoma of the right lung. Tumor had high PD-L1 score.   We will go ahead with a second cycle of chemotherapy with Taxotere.  I am not going to increase the dose any for right now.  Again his quality of life is doing pretty well.  He and his family tolerated the cold spell that we had right before Christmas.  I will do 3 cycles of treatment and then we will follow-up with a another scan.  Again, I am happy that his white cell count has come up.  This will definitely help his risk of infection.    Volanda Napoleon, MD 1/5/20238:18 AM

## 2021-03-09 ENCOUNTER — Other Ambulatory Visit: Payer: Self-pay

## 2021-03-09 ENCOUNTER — Ambulatory Visit (HOSPITAL_COMMUNITY)
Admission: RE | Admit: 2021-03-09 | Discharge: 2021-03-09 | Disposition: A | Discharge status: Home / Self Care | Payer: 59 | Source: Ambulatory Visit | Attending: Physician Assistant | Admitting: Physician Assistant

## 2021-03-09 VITALS — BP 112/74 | HR 76 | Ht 71.0 in | Wt 234.6 lb

## 2021-03-09 DIAGNOSIS — Z7901 Long term (current) use of anticoagulants: Secondary | ICD-10-CM | POA: Insufficient documentation

## 2021-03-09 DIAGNOSIS — J704 Drug-induced interstitial lung disorders, unspecified: Secondary | ICD-10-CM | POA: Insufficient documentation

## 2021-03-09 DIAGNOSIS — C3490 Malignant neoplasm of unspecified part of unspecified bronchus or lung: Secondary | ICD-10-CM | POA: Diagnosis not present

## 2021-03-09 DIAGNOSIS — D6869 Other thrombophilia: Secondary | ICD-10-CM | POA: Diagnosis not present

## 2021-03-09 DIAGNOSIS — I4819 Other persistent atrial fibrillation: Secondary | ICD-10-CM | POA: Insufficient documentation

## 2021-03-09 DIAGNOSIS — Z79899 Other long term (current) drug therapy: Secondary | ICD-10-CM | POA: Insufficient documentation

## 2021-03-09 NOTE — Progress Notes (Signed)
Primary Care Physician: [name], MD Primary Cardiologist: Dr Radford Pax Primary Electrophysiologist: none Referring Physician: Dr Lenny Pastel Clendenin is a 61 y.o. male with a history of stage IIIb/IV lung cancer, RA, and atrial fibrillation who presents for follow up in the [facility].  The patient was initially diagnosed with atrial fibrillation 04/2020 after presenting to the ED with generalized weakness. He had been started on systemic chemotherapy recently PTA.  He was noted to be tachycardic in rapid atrial fibrillation.  He was hospitalized for further management of acute C diff diarrhea, sepsis and febrile neutropenia. He was started on amiodarone which converted him to SR. Anticoagulation was not started given elevated bleeding risk with tumor pressing on pulmonary artery. Patient has a CHADS2VASC score of 3. Patient has not had any recurrence of heart racing and feels "great". There was concern about lung toxicity with either amiodarone or Keytruda on CT 07/18/20 and his amiodarone was discontinued.   Patient was hospitalized again 09/2020 with neutropenic fever with pseudomonal bacteremia/sepsis. During the admission, he developed recurrent afib. After d/w pulmonology, amiodarone was resumed and he underwent DCCV on 10/13/20.   Patient was hospitalized with acute pneumonitis vs community acquired pneumonia. He presented 11/7 with progressive worsening SOB, productive cough with sputum and fatigue despite tx with Augmentin, steroids and supplemental oxygen. He was found hypoxic and required 15 L NRB. He was admitted for sepsis with acute hypoxic respiratory failure 2nd to CAP vs drug induced pneumonitis from [facility].  CTA of chest without PE. He was treated with IV steroids and broad spectrum antibiotics. Patient was initially followed by pulmonary who recommended to continue amiodarone 200mg  daily.  He was also followed by oncology. He was in sinus rhythm on  admit. Went into afib RVR on 11/12. His Toprol XL increased to 100mg  daily from 25mg  PTA. His Xarelto changed to Eliquis 2.5mg  BID due to incompatibility with itraconazole.   On follow up today, patient reports that he has done well since his last visit. He denies any heart racing or palpitations. No bleeding issues on anticoagulation.   Today, he denies symptoms of palpitations, chest pain, orthopnea, PND, lower extremity edema, dizziness, presyncope, syncope, snoring, daytime somnolence, bleeding, or neurologic sequela. The patient is tolerating medications without difficulties and is otherwise without complaint today.    Atrial Fibrillation Risk Factors:  he does not have symptoms or diagnosis of sleep apnea. he does not have a history of rheumatic fever. The patient does have a history of early familial atrial fibrillation or other arrhythmias. Mother has afib.  he has a BMI of Body mass index is 32.72 kg/m.[name] Filed Weights   03/09/21 0920  Weight: 106.4 kg      Family History  Problem Relation Age of Onset   Arrhythmia Mother        has PPM   Heart disease Father        Died at 29, started in his 42s, heart attacks, had PPM and ICD     Atrial Fibrillation Management history:  Previous antiarrhythmic drugs: amiodarone  Previous cardioversions: 10/13/20 Previous ablations: none CHADS2VASC score: 3 Anticoagulation history: Xarelto, Eliquis   Past Medical History:  Diagnosis Date   Dyspnea    Family history of adverse reaction to anesthesia    brother with seizures had episode under anesthesia.  Patient has seizures as well.   GERD (gastroesophageal reflux disease)    History of radiation therapy 04/24/2020-05/16/2020   IMRT to right  lung     Dr [name]   PAF (paroxysmal atrial fibrillation) ([place])    CHADS2VSAC score 0   Rheumatoid aortitis    Squamous cell lung cancer [facility])    Past Surgical History:  Procedure Laterality Date   BRONCHIAL BRUSHINGS  03/27/2020    Procedure: BRONCHIAL BRUSHINGS;  Surgeon: [name], MD;  Location: [place];  Service: Cardiopulmonary;;   BUBBLE STUDY  10/13/2020   Procedure: BUBBLE STUDY;  Surgeon: Pixie Casino, MD;  Location: [place];  Service: Cardiovascular;;   CARDIOVERSION N/A 10/13/2020   Procedure: CARDIOVERSION;  Surgeon: Pixie Casino, MD;  Location: [facility];  Service: Cardiovascular;  Laterality: N/A;   CATARACT EXTRACTION  2016   at [place]  03/27/2020   Procedure: FINE NEEDLE ASPIRATION;  Surgeon: [name], MD;  Location: [place];  Service: Cardiopulmonary;;   HIP SURGERY Left    TEE WITHOUT CARDIOVERSION N/A 10/13/2020   Procedure: TRANSESOPHAGEAL ECHOCARDIOGRAM (TEE);  Surgeon: Pixie Casino, MD;  Location: [place];  Service: Cardiovascular;  Laterality: N/A;   VIDEO BRONCHOSCOPY WITH ENDOBRONCHIAL ULTRASOUND N/A 03/27/2020   Procedure: VIDEO BRONCHOSCOPY WITH ENDOBRONCHIAL ULTRASOUND;  Surgeon: [name], MD;  Location: [place];  Service: Cardiopulmonary;  Laterality: N/A;    Current Outpatient Medications  Medication Sig Dispense Refill   amiodarone (PACERONE) 200 MG tablet Take 1 tablet (200 mg total) by mouth daily.     apixaban (ELIQUIS) 5 MG TABS tablet Take 1 tablet (5 mg total) by mouth 2 (two) times daily. 60 tablet 6   dexamethasone (DECADRON) 4 MG tablet Take 2 tablets (8 mg total) by mouth 2 (two) times daily. Start the day before Taxotere. Then daily after chemo for 2 days. 30 tablet 1   levalbuterol (XOPENEX HFA) 45 MCG/ACT inhaler Inhale 2 puffs into the lungs every 4 (four) hours as needed for wheezing. 1 each 12   levalbuterol (XOPENEX) 0.63 MG/3ML nebulizer solution Take 3 mLs (0.63 mg total) by nebulization every 8 (eight) hours as needed for wheezing or shortness of breath. 240 mL 11   LORazepam (ATIVAN) 0.5 MG tablet Take 1 tablet (0.5 mg total) by mouth every 6 (six) hours as needed (Nausea or vomiting). 30  tablet 0   metoprolol succinate (TOPROL XL) 25 MG 24 hr tablet Take one tablet by mouth daily     ondansetron (ZOFRAN) 8 MG tablet Take 1 tablet (8 mg total) by mouth 2 (two) times daily as needed for refractory nausea / vomiting. 30 tablet 1   PARoxetine (PAXIL) 10 MG tablet Take 10 mg by mouth daily.     predniSONE (DELTASONE) 5 MG tablet Take 5 mg by mouth daily. 01/14/2021 30mg  daily x 2 weeks start 01/12/21, then 20mg  daily x 2 weeks, then 10mg  daily x 2 weeks then 5mg  daily.     PRILOSEC OTC 20 MG tablet Take 20 mg by mouth at bedtime.     prochlorperazine (COMPAZINE) 10 MG tablet Take 1 tablet (10 mg total) by mouth every 6 (six) hours as needed (Nausea or vomiting). 30 tablet 1   umeclidinium-vilanterol (ANORO ELLIPTA) 62.5-25 MCG/ACT AEPB Inhale 1 puff into the lungs daily. 90 each 3   atorvastatin (LIPITOR) 10 MG tablet Take 1 tablet (10 mg total) by mouth daily. 90 tablet 3   No current facility-administered medications for this encounter.    Allergies  Allergen Reactions   Albuterol Other (See Comments)    Patient has had  episode of atrial fib following administration of albuterol (tolerates Xopenex well)    Social History   Socioeconomic History   Marital status: Married    Spouse name: Not on file   Number of children: Not on file   Years of education: Not on file   Highest education level: Not on file  Occupational History   Not on file  Tobacco Use   Smoking status: Former    Packs/day: 1.00    Years: 30.00    Pack years: 30.00    Types: Cigarettes    Quit date: 2018    Years since quitting: 5.0   Smokeless tobacco: Never   Tobacco comments:    Former smoker 10/28/2020  Vaping Use   Vaping Use: Never used  Substance and Sexual Activity   Alcohol use: Not Currently   Drug use: Never   Sexual activity: Not on file  Other Topics Concern   Not on file  Social History Narrative   ** Merged History Encounter **       Social Determinants of Health    Financial Resource Strain: Not on file  Food Insecurity: Not on file  Transportation Needs: Not on file  Physical Activity: Not on file  Stress: Not on file  Social Connections: Not on file  Intimate Partner Violence: Not on file     ROS- All systems are reviewed and negative except as per the HPI above.  Physical Exam: Vitals:   03/09/21 0920  BP: 112/74  Pulse: 76  Weight: 106.4 kg  Height: 5\' 11"  (1.803 m)    GEN- The patient is a well appearing obese male, alert and oriented x 3 today.   HEENT-head normocephalic, atraumatic, sclera clear, conjunctiva pink, hearing intact, trachea midline. Lungs- Clear to ausculation bilaterally, normal work of breathing Heart- Regular rate and rhythm, no murmurs, rubs or gallops  GI- soft, NT, ND, + BS Extremities- no clubbing, cyanosis, or edema MS- no significant deformity or atrophy Skin- no rash or lesion Psych- euthymic mood, full affect Neuro- strength and sensation are intact   Wt Readings from Last 3 Encounters:  03/09/21 106.4 kg  02/19/21 101.6 kg  01/29/21 95.7 kg    EKG today demonstrates  SR, LAFB Vent. rate 76 BPM PR interval 164 ms QRS duration 90 ms QT/QTcB 410/461 ms  Echo 05/12/20 demonstrated   1. Left ventricular ejection fraction, by estimation, is 60 to 65%. The left ventricle has normal function. The left ventricle has no regional wall motion abnormalities. Left ventricular diastolic parameters are indeterminate.   2. Right ventricular systolic function is normal. The right ventricular  size is normal. Tricuspid regurgitation signal is inadequate for assessing PA pressure.   3. The mitral valve is normal in structure. Trivial mitral valve  regurgitation. No evidence of mitral stenosis.   4. The aortic valve is tricuspid. Aortic valve regurgitation is not  visualized. No aortic stenosis is present.   5. The inferior vena cava is normal in size with greater than 50%  respiratory variability,  suggesting right atrial pressure of 3 mmHg.   6. The patient is in atrial fibrillation.   Epic records are reviewed at length today  CHA2DS2-VASc Score = 3  The patient's score is based upon: CHF History: 0 HTN History: 0 Diabetes History: 0 Stroke History: 2 (possible spenic embolic event) Vascular Disease History: 1 Age Score: 0 Gender Score: 0    ASSESSMENT AND PLAN: 1. Persistent Atrial Fibrillation (ICD10:  I48.19) The patient's CHA2DS2-VASc  score is 3, indicating a 3.2% annual risk of stroke.   Patient appears to be maintaining SR. Continue amiodarone 200 mg daily Continue Eliquis 5 mg BID Continue Toprol 25 mg daily   2. Secondary Hypercoagulable State (ICD10:  D68.69) The patient is at significant risk for stroke/thromboembolism based upon his CHA2DS2-VASc Score of 3.  Continue Rivaroxaban (Xarelto).   3. Non-small cell lung cancer Plans per pulmonology and oncology.  4. Drug-induced pneumonitis Keytruda stopped. Not felt to be 2/2 amiodarone at this time per pulmonary notes.   Follow up in the AF clinic in 6 months.    [facility] [address] [place], [place] [address] [phone] 03/09/2021 9:51 AM

## 2021-03-13 ENCOUNTER — Inpatient Hospital Stay: Payer: 59

## 2021-03-13 ENCOUNTER — Encounter: Payer: Self-pay | Admitting: Hematology & Oncology

## 2021-03-13 ENCOUNTER — Other Ambulatory Visit: Payer: Self-pay

## 2021-03-13 ENCOUNTER — Inpatient Hospital Stay: Payer: 59 | Admitting: Hematology & Oncology

## 2021-03-13 VITALS — BP 120/76 | HR 88 | Temp 98.4°F | Resp 20 | Ht 71.0 in | Wt 233.0 lb

## 2021-03-13 DIAGNOSIS — C3400 Malignant neoplasm of unspecified main bronchus: Secondary | ICD-10-CM

## 2021-03-13 DIAGNOSIS — C3491 Malignant neoplasm of unspecified part of right bronchus or lung: Secondary | ICD-10-CM | POA: Diagnosis not present

## 2021-03-13 DIAGNOSIS — Z5111 Encounter for antineoplastic chemotherapy: Secondary | ICD-10-CM | POA: Diagnosis not present

## 2021-03-13 LAB — CBC WITH DIFFERENTIAL (CANCER CENTER ONLY)
Abs Immature Granulocytes: 0.09 10*3/uL — ABNORMAL HIGH (ref 0.00–0.07)
Basophils Absolute: 0 10*3/uL (ref 0.0–0.1)
Basophils Relative: 0 %
Eosinophils Absolute: 0 10*3/uL (ref 0.0–0.5)
Eosinophils Relative: 0 %
HCT: 35.3 % — ABNORMAL LOW (ref 39.0–52.0)
Hemoglobin: 11.7 g/dL — ABNORMAL LOW (ref 13.0–17.0)
Immature Granulocytes: 1 %
Lymphocytes Relative: 5 %
Lymphs Abs: 0.6 10*3/uL — ABNORMAL LOW (ref 0.7–4.0)
MCH: 28.7 pg (ref 26.0–34.0)
MCHC: 33.1 g/dL (ref 30.0–36.0)
MCV: 86.7 fL (ref 80.0–100.0)
Monocytes Absolute: 0.2 10*3/uL (ref 0.1–1.0)
Monocytes Relative: 2 %
Neutro Abs: 10.1 10*3/uL — ABNORMAL HIGH (ref 1.7–7.7)
Neutrophils Relative %: 92 %
Platelet Count: 244 10*3/uL (ref 150–400)
RBC: 4.07 MIL/uL — ABNORMAL LOW (ref 4.22–5.81)
RDW: 18.8 % — ABNORMAL HIGH (ref 11.5–15.5)
WBC Count: 11 10*3/uL — ABNORMAL HIGH (ref 4.0–10.5)
nRBC: 0 % (ref 0.0–0.2)

## 2021-03-13 LAB — CMP (CANCER CENTER ONLY)
ALT: 9 U/L (ref 0–44)
AST: 11 U/L — ABNORMAL LOW (ref 15–41)
Albumin: 4.2 g/dL (ref 3.5–5.0)
Alkaline Phosphatase: 67 U/L (ref 38–126)
Anion gap: 13 (ref 5–15)
BUN: 15 mg/dL (ref 6–20)
CO2: 20 mmol/L — ABNORMAL LOW (ref 22–32)
Calcium: 9.7 mg/dL (ref 8.9–10.3)
Chloride: 103 mmol/L (ref 98–111)
Creatinine: 1.13 mg/dL (ref 0.61–1.24)
GFR, Estimated: >60 mL/min (ref >60)
Glucose, Bld: 214 mg/dL — ABNORMAL HIGH (ref 70–99)
Potassium: 4.2 mmol/L (ref 3.5–5.1)
Sodium: 136 mmol/L (ref 135–145)
Total Bilirubin: 0.7 mg/dL (ref 0.3–1.2)
Total Protein: 6.5 g/dL (ref 6.5–8.1)

## 2021-03-13 LAB — LACTATE DEHYDROGENASE: LDH: 266 U/L — ABNORMAL HIGH (ref 98–192)

## 2021-03-13 MED ORDER — SODIUM CHLORIDE 0.9 % IV SOLN: Freq: Once | INTRAVENOUS | Status: AC

## 2021-03-13 MED ORDER — PREDNISONE 5 MG PO TABS: 5.0000 mg | ORAL_TABLET | Freq: Every day | ORAL | 6 refills | Status: DC

## 2021-03-13 MED ORDER — SODIUM CHLORIDE 0.9 % IV SOLN
60.0000 mg/m2 | Freq: Once | INTRAVENOUS | Status: AC
  Administered 2021-03-13: 130 mg via INTRAVENOUS
  Filled 2021-03-13: qty 13

## 2021-03-13 MED ORDER — SODIUM CHLORIDE 0.9 % IV SOLN
10.0000 mg | Freq: Once | INTRAVENOUS | Status: AC
  Administered 2021-03-13: 10 mg via INTRAVENOUS
  Filled 2021-03-13: qty 10

## 2021-03-13 NOTE — Addendum Note (Signed)
Addended by: Burney Gauze R on: 03/13/2021 10:30 AM   Modules accepted: Orders

## 2021-03-13 NOTE — Progress Notes (Signed)
+ Hematology and Oncology Follow Up Visit  John [name] [id number] 05-Mar-1960 62 y.o. 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 here today for follow-up.  He actually looks pretty good.  He is still wearing the oxygen.  I think it tasted awful a more often now.  I am impressed that his white cell count continues to get better.  This might be from the Neulasta that he is getting.  Regardless, his white cell count is where it needs to be as immune system is stronger.  He has had no problems with bleeding.  He has had no problems with bowels or bladder.  There is been no constipation.  He saw his cardiologist.  His cardiologist said that it would be okay for him to come off the amiodarone.  I think this would be a good idea.  He has had no problems with leg swelling.  He is on low-dose prednisone for rheumatoid arthritis.  The low-dose prednisone does seem to help him.  Currently, I would have to say that his performance status is probably ECOG 1.     Medications:  Allergies as of 03/13/2021       Reactions   Albuterol Other (See Comments)   Patient has had episode of atrial fib following administration of albuterol (tolerates Xopenex well)        Medication List        Accurate as of March 13, 2021 10:05 AM. If you have any questions, ask your nurse or doctor.          amiodarone 200 MG tablet Commonly known as: PACERONE Take 1 tablet (200 mg total) by mouth daily.   apixaban 5 MG Tabs tablet Commonly known as: Eliquis Take 1 tablet (5 mg total) by mouth 2 (two) times daily.   atorvastatin 10 MG tablet Commonly known as: LIPITOR Take 1 tablet (10 mg total) by  mouth daily.   dexamethasone 4 MG tablet Commonly known as: DECADRON Take 2 tablets (8 mg total) by mouth 2 (two) times daily. Start the day before Taxotere. Then daily after chemo for 2 days.   levalbuterol 0.63 MG/3ML nebulizer solution Commonly known as: XOPENEX Take 3 mLs (0.63 mg total) by nebulization every 8 (eight) hours as needed for wheezing or shortness of breath.   levalbuterol 45 MCG/ACT inhaler Commonly known as: Xopenex HFA Inhale 2 puffs into the lungs every 4 (four) hours as needed for wheezing.   LORazepam 0.5 MG tablet Commonly known as: Ativan Take 1 tablet (0.5 mg total) by mouth every 6 (six) hours as needed (Nausea or vomiting).   metoprolol succinate 25 MG 24 hr tablet Commonly known as: Toprol XL Take one tablet by mouth daily   ondansetron 8 MG tablet Commonly known as: Zofran Take 1 tablet (8 mg total) by mouth 2 (two) times daily as needed for refractory nausea / vomiting.   PARoxetine 10 MG tablet Commonly known as: PAXIL Take 10 mg by mouth daily.   predniSONE 5 MG tablet Commonly known as: DELTASONE Take 5 mg by mouth daily. 01/14/2021 361mdaily x 2 weeks start 01/12/21, then 2048maily x  2 weeks, then 29m daily x 2 weeks then 561mdaily.   PriLOSEC OTC 20 MG tablet Generic drug: omeprazole Take 20 mg by mouth at bedtime.   prochlorperazine 10 MG tablet Commonly known as: COMPAZINE Take 1 tablet (10 mg total) by mouth every 6 (six) hours as needed (Nausea or vomiting).   umeclidinium-vilanterol 62.5-25 MCG/ACT Aepb Commonly known as: ANORO ELLIPTA Inhale 1 puff into the lungs daily.        Allergies:  Allergies  Allergen Reactions   Albuterol Other (See Comments)    Patient has had episode of atrial fib following administration of albuterol (tolerates Xopenex well)    Past Medical History, Surgical history, Social history, and Family History were reviewed and updated.  Review of Systems: Review of Systems  Constitutional:   Positive for malaise/fatigue.  HENT: Negative.    Eyes: Negative.   Respiratory:  Positive for cough and shortness of breath.   Cardiovascular:  Positive for chest pain.  Gastrointestinal: Negative.   Genitourinary: Negative.   Musculoskeletal:  Positive for joint pain.  Skin: Negative.   Neurological:  Positive for focal weakness.  Endo/Heme/Allergies: Negative.   Psychiatric/Behavioral: Negative.      Physical Exam:  height is '5\' 11"'  (1.803 m) and weight is 233 lb (105.7 kg). His oral temperature is 98.4 F (36.9 C). His blood pressure is 120/76 and his pulse is 88. His respiration is 20 and oxygen saturation is 98%.   Wt Readings from Last 3 Encounters:  03/13/21 233 lb (105.7 kg)  03/09/21 234 lb 9.6 oz (106.4 kg)  02/19/21 224 lb (101.6 kg)    Physical Exam Vitals reviewed.  HENT:     Head: Normocephalic and atraumatic.  Eyes:     Pupils: Pupils are equal, round, and reactive to light.  Cardiovascular:     Rate and Rhythm: Normal rate and regular rhythm.     Heart sounds: Normal heart sounds.  Pulmonary:     Effort: Pulmonary effort is normal.     Breath sounds: Normal breath sounds.  Abdominal:     General: Bowel sounds are normal.     Palpations: Abdomen is soft.  Musculoskeletal:        General: No tenderness or deformity. Normal range of motion.     Cervical back: Normal range of motion.  Lymphadenopathy:     Cervical: No cervical adenopathy.  Skin:    General: Skin is warm and dry.     Findings: No erythema or rash.  Neurological:     Mental Status: He is alert and oriented to person, place, and time.  Psychiatric:        Behavior: Behavior normal.        Thought Content: Thought content normal.        Judgment: Judgment normal.    Lab Results  Component Value Date   WBC 11.0 (H) 03/13/2021   HGB 11.7 (L) 03/13/2021   HCT 35.3 (L) 03/13/2021   MCV 86.7 03/13/2021   PLT 244 03/13/2021   No results found for: FERRITIN, IRON, TIBC, UIBC,  IRONPCTSAT Lab Results  Component Value Date   RBC 4.07 (L) 03/13/2021   No results found for: KPAFRELGTCHN, LAMBDASER, KAPLAMBRATIO No results found for: IGGSERUM, IGA, IGMSERUM No results found for: TORonnald RampA1GS, A2GS, BEViolet BaldyMSPIKE, SPEI   Chemistry      Component Value Date/Time   NA 136 03/13/2021 0909   K 4.2 03/13/2021 0909   CL 103 03/13/2021 0909  CO2 20 (L) 03/13/2021 0909   BUN 15 03/13/2021 0909   CREATININE 1.13 03/13/2021 0909      Component Value Date/Time   CALCIUM 9.7 03/13/2021 0909   ALKPHOS 67 03/13/2021 0909   AST 11 (L) 03/13/2021 0909   ALT 9 03/13/2021 0909   BILITOT 0.7 03/13/2021 0909       Impression and Plan: Mr. Ardis is a very pleasant 61 yo gentleman with metastatic squamous cell carcinoma of the right lung. Tumor had high PD-L1 score.   We will go ahead with his third cycle of treatment.  After this cycle, we will do his CT scan to see everything looks.  I am just happy that the white cell count is better.  Again the Neulasta might be helping this.  I really hope that the CT scan will look better.  I know Mr. Palomarez is trying hard.  I given full credit for being so tough and having a strong constitution.    Volanda Napoleon, MD 1/27/202310:05 AM

## 2021-03-13 NOTE — Patient Instructions (Signed)
[facility] [place]  Discharge Instructions: Thank you for choosing [facility] to provide your oncology and hematology care.   If you have a lab appointment with the [facility], please go directly to the [facility] and check in at the registration area.  Wear comfortable clothing and clothing appropriate for easy access to any Portacath or PICC line.   We strive to give you quality time with your provider. You may need to reschedule your appointment if you arrive late (15 or more minutes).  Arriving late affects you and other patients whose appointments are after yours.  Also, if you miss three or more appointments without notifying the office, you may be dismissed from the clinic at the providers discretion.      For prescription refill requests, have your pharmacy contact our office and allow 72 hours for refills to be completed.    Today you received the following chemotherapy and/or immunotherapy agents Taxotere      To help prevent nausea and vomiting after your treatment, we encourage you to take your nausea medication as directed.  BELOW ARE SYMPTOMS THAT SHOULD BE REPORTED IMMEDIATELY: *FEVER GREATER THAN 100.4 F (38 C) OR HIGHER *CHILLS OR SWEATING *NAUSEA AND VOMITING THAT IS NOT CONTROLLED WITH YOUR NAUSEA MEDICATION *UNUSUAL SHORTNESS OF BREATH *UNUSUAL BRUISING OR BLEEDING *URINARY PROBLEMS (pain or burning when urinating, or frequent urination) *BOWEL PROBLEMS (unusual diarrhea, constipation, pain near the anus) TENDERNESS IN MOUTH AND THROAT WITH OR WITHOUT PRESENCE OF ULCERS (sore throat, sores in mouth, or a toothache) UNUSUAL RASH, SWELLING OR PAIN  UNUSUAL VAGINAL DISCHARGE OR ITCHING   Items with * indicate a potential emergency and should be followed up as soon as possible or go to the Emergency Department if any problems should occur.  Please show the CHEMOTHERAPY ALERT CARD or IMMUNOTHERAPY ALERT CARD at check-in to the  Emergency Department and triage nurse. Should you have questions after your visit or need to cancel or reschedule your appointment, please contact [facility]  [phone] and follow the prompts.  Office hours are 8:00 a.m. to 4:30 p.m. Monday - Friday. Please note that voicemails left after 4:00 p.m. may not be returned until the following business day.  We are closed weekends and major holidays. You have access to a nurse at all times for urgent questions. Please call the main number to the clinic [phone] and follow the prompts.  For any non-urgent questions, you may also contact your provider using MyChart. We now offer e-Visits for anyone 60 and older to request care online for non-urgent symptoms. For details visit [email or web address].   Also download the MyChart app! Go to the app store, search "MyChart", open the app, select [facility], and log in with your MyChart username and password.  Due to Covid, a mask is required upon entering the hospital/clinic. If you do not have a mask, one will be given to you upon arrival. For doctor visits, patients may have 1 support person aged 60 or older with them. For treatment visits, patients cannot have anyone with them due to current Covid guidelines and our immunocompromised population.

## 2021-03-13 NOTE — Progress Notes (Signed)
[name] would like docetaxel dose decreased to 60 mg/m2 as it was with the last treatment. Orders changed per his instructions.

## 2021-03-30 ENCOUNTER — Other Ambulatory Visit: Payer: Self-pay

## 2021-03-30 ENCOUNTER — Emergency Department (HOSPITAL_COMMUNITY): Payer: 59

## 2021-03-30 ENCOUNTER — Inpatient Hospital Stay (HOSPITAL_COMMUNITY)
Admission: EM | Admit: 2021-03-30 | Discharge: 2021-04-03 | DRG: 199 | Disposition: A | Discharge status: Home / Self Care | Payer: 59 | Attending: Internal Medicine | Admitting: Internal Medicine

## 2021-03-30 ENCOUNTER — Ambulatory Visit
Admission: RE | Admit: 2021-03-30 | Discharge: 2021-03-30 | Disposition: A | Discharge status: Home / Self Care | Payer: 59 | Source: Ambulatory Visit | Attending: Hematology & Oncology | Admitting: Hematology & Oncology

## 2021-03-30 ENCOUNTER — Encounter (HOSPITAL_COMMUNITY): Payer: Self-pay | Admitting: Internal Medicine

## 2021-03-30 DIAGNOSIS — C3411 Malignant neoplasm of upper lobe, right bronchus or lung: Secondary | ICD-10-CM | POA: Diagnosis present

## 2021-03-30 DIAGNOSIS — Z8249 Family history of ischemic heart disease and other diseases of the circulatory system: Secondary | ICD-10-CM

## 2021-03-30 DIAGNOSIS — K219 Gastro-esophageal reflux disease without esophagitis: Secondary | ICD-10-CM | POA: Diagnosis present

## 2021-03-30 DIAGNOSIS — I4819 Other persistent atrial fibrillation: Secondary | ICD-10-CM | POA: Diagnosis present

## 2021-03-30 DIAGNOSIS — C3491 Malignant neoplasm of unspecified part of right bronchus or lung: Secondary | ICD-10-CM | POA: Diagnosis present

## 2021-03-30 DIAGNOSIS — J9383 Other pneumothorax: Secondary | ICD-10-CM | POA: Diagnosis not present

## 2021-03-30 DIAGNOSIS — Z9981 Dependence on supplemental oxygen: Secondary | ICD-10-CM

## 2021-03-30 DIAGNOSIS — C7971 Secondary malignant neoplasm of right adrenal gland: Secondary | ICD-10-CM | POA: Diagnosis present

## 2021-03-30 DIAGNOSIS — M069 Rheumatoid arthritis, unspecified: Secondary | ICD-10-CM | POA: Diagnosis present

## 2021-03-30 DIAGNOSIS — J8410 Pulmonary fibrosis, unspecified: Secondary | ICD-10-CM | POA: Diagnosis present

## 2021-03-30 DIAGNOSIS — J939 Pneumothorax, unspecified: Secondary | ICD-10-CM | POA: Diagnosis present

## 2021-03-30 DIAGNOSIS — Z7901 Long term (current) use of anticoagulants: Secondary | ICD-10-CM

## 2021-03-30 DIAGNOSIS — Z923 Personal history of irradiation: Secondary | ICD-10-CM

## 2021-03-30 DIAGNOSIS — Z7952 Long term (current) use of systemic steroids: Secondary | ICD-10-CM

## 2021-03-30 DIAGNOSIS — Z20822 Contact with and (suspected) exposure to covid-19: Secondary | ICD-10-CM | POA: Diagnosis present

## 2021-03-30 DIAGNOSIS — J9311 Primary spontaneous pneumothorax: Secondary | ICD-10-CM

## 2021-03-30 DIAGNOSIS — Z9689 Presence of other specified functional implants: Secondary | ICD-10-CM

## 2021-03-30 DIAGNOSIS — J9621 Acute and chronic respiratory failure with hypoxia: Secondary | ICD-10-CM | POA: Diagnosis present

## 2021-03-30 DIAGNOSIS — D630 Anemia in neoplastic disease: Secondary | ICD-10-CM | POA: Diagnosis present

## 2021-03-30 DIAGNOSIS — J9611 Chronic respiratory failure with hypoxia: Secondary | ICD-10-CM | POA: Diagnosis present

## 2021-03-30 DIAGNOSIS — E876 Hypokalemia: Secondary | ICD-10-CM | POA: Diagnosis present

## 2021-03-30 DIAGNOSIS — D649 Anemia, unspecified: Secondary | ICD-10-CM | POA: Diagnosis present

## 2021-03-30 DIAGNOSIS — J9382 Other air leak: Secondary | ICD-10-CM | POA: Diagnosis present

## 2021-03-30 DIAGNOSIS — E222 Syndrome of inappropriate secretion of antidiuretic hormone: Secondary | ICD-10-CM | POA: Diagnosis present

## 2021-03-30 DIAGNOSIS — I480 Paroxysmal atrial fibrillation: Secondary | ICD-10-CM | POA: Diagnosis present

## 2021-03-30 DIAGNOSIS — E871 Hypo-osmolality and hyponatremia: Secondary | ICD-10-CM | POA: Diagnosis present

## 2021-03-30 DIAGNOSIS — Z79899 Other long term (current) drug therapy: Secondary | ICD-10-CM

## 2021-03-30 DIAGNOSIS — Z888 Allergy status to other drugs, medicaments and biological substances status: Secondary | ICD-10-CM

## 2021-03-30 DIAGNOSIS — Z87891 Personal history of nicotine dependence: Secondary | ICD-10-CM

## 2021-03-30 LAB — CBC WITH DIFFERENTIAL/PLATELET
Abs Immature Granulocytes: 0.05 10*3/uL (ref 0.00–0.07)
Basophils Absolute: 0 10*3/uL (ref 0.0–0.1)
Basophils Relative: 1 %
Eosinophils Absolute: 0 10*3/uL (ref 0.0–0.5)
Eosinophils Relative: 0 %
HCT: 32.9 % — ABNORMAL LOW (ref 39.0–52.0)
Hemoglobin: 10.9 g/dL — ABNORMAL LOW (ref 13.0–17.0)
Immature Granulocytes: 1 %
Lymphocytes Relative: 14 %
Lymphs Abs: 0.9 10*3/uL (ref 0.7–4.0)
MCH: 29.1 pg (ref 26.0–34.0)
MCHC: 33.1 g/dL (ref 30.0–36.0)
MCV: 87.7 fL (ref 80.0–100.0)
Monocytes Absolute: 0.6 10*3/uL (ref 0.1–1.0)
Monocytes Relative: 10 %
Neutro Abs: 4.6 10*3/uL (ref 1.7–7.7)
Neutrophils Relative %: 74 %
Platelets: 245 10*3/uL (ref 150–400)
RBC: 3.75 MIL/uL — ABNORMAL LOW (ref 4.22–5.81)
RDW: 16.8 % — ABNORMAL HIGH (ref 11.5–15.5)
WBC: 6.1 10*3/uL (ref 4.0–10.5)
nRBC: 0 % (ref 0.0–0.2)

## 2021-03-30 LAB — BASIC METABOLIC PANEL
Anion gap: 9 (ref 5–15)
BUN: 15 mg/dL (ref 6–20)
CO2: 25 mmol/L (ref 22–32)
Calcium: 8.7 mg/dL — ABNORMAL LOW (ref 8.9–10.3)
Chloride: 99 mmol/L (ref 98–111)
Creatinine, Ser: 1.09 mg/dL (ref 0.61–1.24)
GFR, Estimated: >60 mL/min (ref >60)
Glucose, Bld: 109 mg/dL — ABNORMAL HIGH (ref 70–99)
Potassium: 4 mmol/L (ref 3.5–5.1)
Sodium: 133 mmol/L — ABNORMAL LOW (ref 135–145)

## 2021-03-30 LAB — RESP PANEL BY RT-PCR (FLU A&B, COVID) ARPGX2
Influenza A by PCR: NEGATIVE
Influenza B by PCR: NEGATIVE
SARS Coronavirus 2 by RT PCR: NEGATIVE

## 2021-03-30 MED ORDER — PANTOPRAZOLE SODIUM 40 MG PO TBEC
40.0000 mg | DELAYED_RELEASE_TABLET | Freq: Every day | ORAL | Status: DC
  Administered 2021-03-30 – 2021-04-02 (×4): 40 mg via ORAL
  Filled 2021-03-30 (×4): qty 1

## 2021-03-30 MED ORDER — PAROXETINE HCL 10 MG PO TABS
10.0000 mg | ORAL_TABLET | Freq: Every day | ORAL | Status: DC
  Administered 2021-03-30 – 2021-04-02 (×4): 10 mg via ORAL
  Filled 2021-03-30 (×6): qty 1

## 2021-03-30 MED ORDER — ACETAMINOPHEN 650 MG RE SUPP: 650.0000 mg | Freq: Four times a day (QID) | RECTAL | Status: DC | PRN

## 2021-03-30 MED ORDER — METOPROLOL SUCCINATE ER 25 MG PO TB24
25.0000 mg | ORAL_TABLET | Freq: Every day | ORAL | Status: DC
  Administered 2021-03-31 – 2021-04-03 (×4): 25 mg via ORAL
  Filled 2021-03-30 (×4): qty 1

## 2021-03-30 MED ORDER — SODIUM CHLORIDE 0.9% FLUSH
10.0000 mL | Freq: Three times a day (TID) | INTRAVENOUS | Status: DC
  Administered 2021-03-31 – 2021-04-01 (×4): 10 mL

## 2021-03-30 MED ORDER — PREDNISONE 5 MG PO TABS
5.0000 mg | ORAL_TABLET | Freq: Every day | ORAL | Status: DC
  Administered 2021-03-31 – 2021-04-03 (×4): 5 mg via ORAL
  Filled 2021-03-30 (×4): qty 1

## 2021-03-30 MED ORDER — APIXABAN 5 MG PO TABS
5.0000 mg | ORAL_TABLET | Freq: Two times a day (BID) | ORAL | Status: DC
  Administered 2021-03-30 – 2021-04-03 (×8): 5 mg via ORAL
  Filled 2021-03-30 (×8): qty 1

## 2021-03-30 MED ORDER — SODIUM CHLORIDE 0.9% FLUSH
3.0000 mL | Freq: Two times a day (BID) | INTRAVENOUS | Status: DC
  Administered 2021-03-30 – 2021-04-03 (×8): 3 mL via INTRAVENOUS

## 2021-03-30 MED ORDER — OMEPRAZOLE MAGNESIUM 20 MG PO TBEC: 20.0000 mg | DELAYED_RELEASE_TABLET | Freq: Every day | ORAL | Status: DC

## 2021-03-30 MED ORDER — AMIODARONE HCL 200 MG PO TABS
200.0000 mg | ORAL_TABLET | Freq: Every day | ORAL | Status: DC
  Administered 2021-03-30 – 2021-04-02 (×4): 200 mg via ORAL
  Filled 2021-03-30 (×5): qty 1

## 2021-03-30 MED ORDER — POLYETHYLENE GLYCOL 3350 17 G PO PACK: 17.0000 g | PACK | Freq: Every day | ORAL | Status: DC | PRN

## 2021-03-30 MED ORDER — ACETAMINOPHEN 325 MG PO TABS
650.0000 mg | ORAL_TABLET | Freq: Four times a day (QID) | ORAL | Status: DC | PRN
  Administered 2021-03-30 – 2021-04-02 (×2): 650 mg via ORAL
  Filled 2021-03-30 (×2): qty 2

## 2021-03-30 MED ORDER — UMECLIDINIUM-VILANTEROL 62.5-25 MCG/ACT IN AEPB
1.0000 | INHALATION_SPRAY | Freq: Every day | RESPIRATORY_TRACT | Status: DC
  Administered 2021-03-31 – 2021-04-03 (×4): 1 via RESPIRATORY_TRACT
  Filled 2021-03-30 (×2): qty 14

## 2021-03-30 MED ORDER — IOPAMIDOL (ISOVUE-300) INJECTION 61%
100.0000 mL | Freq: Once | INTRAVENOUS | Status: AC | PRN
  Administered 2021-03-30: 100 mL via INTRAVENOUS

## 2021-03-30 MED ORDER — LEVALBUTEROL HCL 0.63 MG/3ML IN NEBU: 0.6300 mg | INHALATION_SOLUTION | Freq: Three times a day (TID) | RESPIRATORY_TRACT | Status: DC | PRN

## 2021-03-30 NOTE — ED Provider Notes (Signed)
[facility] DEPT Provider Note   CSN: [id number] Arrival date & time: 03/30/21  1648     History  Chief Complaint  Patient presents with   Shortness of Breath    John [name] is a 61 y.o. male.  John [name] is a 61 y.o. male with a history of squamous cell lung cancer, COPD, GERD, paroxysmal A-fib, who presents to the emergency department from [facility] imaging for evaluation of pneumothorax.  Patient was following up today for routine CT abdomen pelvis to evaluate his lung cancer, and was found to have a large right-sided pneumothorax.  He reports that on Saturday he had lifted something moderately heavy and felt a pop on the right side of his chest but assumed he just pulled a muscle.  He reports that he started to have some increased shortness of breath.  He has 3 L nasal cannula oxygen to wear as needed and has been wearing it more consistently since Saturday.  The history is provided by the patient and the spouse.      Home Medications Prior to Admission medications   Medication Sig Start Date End Date Taking? Authorizing Provider  amiodarone (PACERONE) 200 MG tablet Take 200 mg by mouth at bedtime.   Yes [provider]  apixaban (ELIQUIS) 5 MG TABS tablet Take 1 tablet (5 mg total) by mouth 2 (two) times daily. 01/05/21  Yes Fenton, Clint R, PA  dexamethasone (DECADRON) 4 MG tablet Take 2 tablets (8 mg total) by mouth 2 (two) times daily. Start the day before Taxotere. Then daily after chemo for 2 days. 01/29/21  Yes Ennever, Rudell Cobb, MD  levalbuterol (XOPENEX) 0.63 MG/3ML nebulizer solution Take 3 mLs (0.63 mg total) by nebulization every 8 (eight) hours as needed for wheezing or shortness of breath. 01/29/21  Yes [name], MD  LORazepam (ATIVAN) 0.5 MG tablet Take 1 tablet (0.5 mg total) by mouth every 6 (six) hours as needed (Nausea or vomiting). 01/29/21  Yes Volanda Napoleon, MD  metoprolol succinate (TOPROL XL) 25 MG 24 hr  tablet Take one tablet by mouth daily 01/05/21  Yes Fenton, Clint R, PA  ondansetron (ZOFRAN) 8 MG tablet Take 1 tablet (8 mg total) by mouth 2 (two) times daily as needed for refractory nausea / vomiting. 01/29/21  Yes Ennever, Rudell Cobb, MD  PARoxetine (PAXIL) 10 MG tablet Take 10 mg by mouth at bedtime. 01/25/20  Yes [provider]  predniSONE (DELTASONE) 5 MG tablet Take 1 tablet (5 mg total) by mouth daily. 03/13/21  Yes Ennever, Rudell Cobb, MD  PRILOSEC OTC 20 MG tablet Take 20 mg by mouth at bedtime. 11/13/19  Yes [provider]  umeclidinium-vilanterol (ANORO ELLIPTA) 62.5-25 MCG/ACT AEPB Inhale 1 puff into the lungs daily. 01/29/21 04/29/21 Yes [name], MD  amiodarone (PACERONE) 200 MG tablet Take 1 tablet (200 mg total) by mouth daily. 01/05/21 03/13/21  Fenton, Clint R, PA  atorvastatin (LIPITOR) 10 MG tablet Take 1 tablet (10 mg total) by mouth daily. 11/19/20 03/13/21  Imogene Burn, PA-C  levalbuterol [facility] HFA) 45 MCG/ACT inhaler Inhale 2 puffs into the lungs every 4 (four) hours as needed for wheezing. Patient not taking: Reported on 03/30/2021 06/11/20   [name], MD  prochlorperazine (COMPAZINE) 10 MG tablet Take 1 tablet (10 mg total) by mouth every 6 (six) hours as needed (Nausea or vomiting). Patient not taking: Reported on 03/13/2021 01/29/21   Volanda Napoleon, MD  Allergies    Albuterol    Review of Systems   Review of Systems  Constitutional:  Negative for chills and fever.  HENT: Negative.    Respiratory:  Positive for shortness of breath. Negative for cough.   Cardiovascular:  Positive for chest pain. Negative for palpitations and leg swelling.  Gastrointestinal:  Negative for abdominal pain, nausea and vomiting.  All other systems reviewed and are negative.  Physical Exam Updated Vital Signs BP 134/78 (BP Location: Left Arm)    Pulse (!) 113    Temp 98.5 F (36.9 C) (Oral)    Resp (!) 25    SpO2 92%  Physical Exam Vitals and  nursing note reviewed.  Constitutional:      General: He is not in acute distress.    Appearance: Normal appearance. He is well-developed. He is not ill-appearing or diaphoretic.  HENT:     Head: Normocephalic and atraumatic.  Eyes:     General:        Right eye: No discharge.        Left eye: No discharge.  Cardiovascular:     Rate and Rhythm: Normal rate and regular rhythm.     Heart sounds: Normal heart sounds.  Pulmonary:     Effort: No respiratory distress.     Breath sounds: Decreased breath sounds present.     Comments: Tachypneic, satting in the low 90s on 4 L nasal cannula, able to speak in full sentences, absent breath sounds on the right, lungs clear with normal air movement on the left. Chest:     Chest wall: No tenderness.  Abdominal:     General: Bowel sounds are normal.     Tenderness: There is no abdominal tenderness.  Musculoskeletal:     Right lower leg: No tenderness. No edema.     Left lower leg: No tenderness. No edema.  Skin:    General: Skin is warm and dry.  Neurological:     Mental Status: He is alert and oriented to person, place, and time.     Coordination: Coordination normal.  Psychiatric:        Mood and Affect: Mood normal.        Behavior: Behavior normal.    ED Results / Procedures / Treatments   Labs (all labs ordered are listed, but only abnormal results are displayed) Labs Reviewed  BASIC METABOLIC PANEL - Abnormal; Notable for the following components:      Result Value   Sodium 133 (*)    Glucose, Bld 109 (*)    Calcium 8.7 (*)    All other components within normal limits  CBC WITH DIFFERENTIAL/PLATELET - Abnormal; Notable for the following components:   RBC 3.75 (*)    Hemoglobin 10.9 (*)    HCT 32.9 (*)    RDW 16.8 (*)    All other components within normal limits  RESP PANEL BY RT-PCR (FLU A&B, COVID) [id number]  COMPREHENSIVE METABOLIC PANEL  CBC    EKG None  Radiology DG Chest 1 View  Result Date: 03/30/2021 CLINICAL  DATA:  Status post chest tube insertion. EXAM: CHEST  1 VIEW COMPARISON:  Chest x-ray 03/30/2021 5:35 p.m. FINDINGS: The heart and mediastinal contours are unchanged. Aortic calcification. Low lung volume. Bilateral mid to lower lung zone interstitial and airspace opacities. No pulmonary edema. Interval placement of a right chest tube with pigtail overlying the right costophrenic angle. Interval decrease in size of a now small right pneumothorax. No pneumothorax. No acute osseous  abnormality. IMPRESSION: 1. Interval placement of a right chest tube with pigtail overlying the right costophrenic angle. Interval decrease in size of a now small right pneumothorax. 2. Bilateral mid to lower lung zone interstitial and airspace opacities. 3.  Aortic Atherosclerosis (ICD10-I70.0). Electronically Signed   By: Iven Finn M.D.   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 Evaluate treatment response. Shortness of breath EXAM: CT CHEST, ABDOMEN, AND PELVIS WITH CONTRAST TECHNIQUE: Multidetector CT imaging of the chest, abdomen and pelvis was performed following the standard protocol during bolus administration of intravenous contrast. RADIATION DOSE REDUCTION: This exam was performed according to the departmental dose-optimization program which includes automated exposure control, adjustment of the mA and/or kV according to patient size and/or use of iterative reconstruction technique. CONTRAST:  152mL ISOVUE-300 IOPAMIDOL (ISOVUE-300) INJECTION 61% COMPARISON:  12/31/2020 chest CT.  Abdominopelvic CT 07/18/2020 FINDINGS: CT CHEST FINDINGS Cardiovascular: Aortic atherosclerosis. Normal heart size with lipomatous hypertrophy of the interatrial septum. Lad and left circumflex coronary artery calcification. Mediastinum/Nodes: No supraclavicular adenopathy. No mediastinal or hilar adenopathy. Lungs/Pleura: No pleural  fluid. Development of a large right-sided pneumothorax. The previously described pneumomediastinum has resolved. Centrilobular emphysema. The right lung is poorly evaluated secondary to collapse. Suspect underlying fibrosis, with architectural distortion and scattered ground-glass. Right upper lobe or superior segment lower lobe dominant bulla/blebs on 63/4. Underlying centrilobular emphysema. 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 Musculoskeletal: No acute osseous abnormality. CT ABDOMEN PELVIS FINDINGS Hepatobiliary: Normal liver. 1.8 cm stone in the gallbladder neck. No acute cholecystitis or biliary duct dilatation. Pancreas: Pancreatic fatty replacement throughout the body and head. No duct dilatation or acute inflammation. Spleen: Minimal splenic hypoattenuation along the capsule on 62/2 likely represents sequelae of splenic infarct. Adrenals/Urinary Tract: Normal left adrenal gland. Bilobed right adrenal mass measures 4.9 x 3.1 cm on 58/2 versus 5.8 x 3.0 cm on the prior exam. Too small to characterize interpolar left renal lesion. Normal right kidney. No hydronephrosis. Normal urinary bladder. Stomach/Bowel: Normal stomach, without wall thickening. Normal colon, appendix, and terminal ileum. Normal small bowel. Vascular/Lymphatic: Aortic atherosclerosis. No abdominopelvic adenopathy. Reproductive: Normal prostate. Other: Small fat containing left inguinal hernia. No significant free fluid. No evidence of omental or peritoneal disease. Tiny fat containing ventral abdominal wall hernia. Musculoskeletal: L2 mild compression deformity or Schmorl's node, similar. IMPRESSION: CT CHEST IMPRESSION 1. Large right-sided pneumothorax. 2. Suboptimal evaluation of the right lung. Suspect treatment related fibrosis, as before. 3. No thoracic adenopathy. 4. Coronary artery atherosclerosis. Aortic Atherosclerosis (ICD10-I70.0). Emphysema  (ICD10-J43.9). CT ABDOMEN AND PELVIS IMPRESSION 1. 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 2. No new or progressive abdominopelvic metastasis. 3. Cholelithiasis. 4. Sequelae of splenic infarct. A call to the emergency room provider at [facility] (where patient is currently) is pending as of 6:08 p.m. Electronically Signed   By: Abigail Miyamoto M.D.   On: 03/30/2021 18:08   DG Chest Port 1 View  Result Date: 03/30/2021 CLINICAL DATA:  Pneumothorax EXAM: PORTABLE CHEST 1 VIEW COMPARISON:  01/12/2021, CT 12/31/2020, chest x-ray 05/12/2020, 10/10/2020 FINDINGS: Large cavitary lesion in the right mid lung. Underlying coarse interstitial opacity. Normal cardiac size with aortic atherosclerosis. Right-sided pneumothorax approaches 50%. Slight midline shift to the left. IMPRESSION: 1. Large right-sided pneumothorax approaches 50%. 2. There is underlying interstitial disease. Cavitary lesion in the right mid lung as noted on previous exam Critical  Value/emergent results were called by telephone at the time of interpretation on 03/30/2021 at 5:56 pm to provider [facility] , who verbally acknowledged these results. Electronically Signed   By: Donavan Foil M.D.   On: 03/30/2021 17:56    Procedures .Critical Care Performed by: [name], PA-C Authorized by: [name], PA-C   Critical care provider statement:    Critical care time (minutes):  45   Critical care was time spent personally by me on the following activities:  Development of treatment plan with patient or surrogate, discussions with consultants, evaluation of patient's response to treatment, examination of patient, ordering and review of laboratory studies, ordering and review of radiographic studies, ordering and performing treatments and interventions, pulse oximetry, re-evaluation of patient's condition and review of old charts   Care discussed with: admitting provider      Medications Ordered in ED Medications   sodium chloride flush (NS) 0.9 % injection 10 mL (10 mLs Other Not Given 03/30/21 1915)  amiodarone (PACERONE) tablet 200 mg (has no administration in time range)  metoprolol succinate (TOPROL-XL) 24 hr tablet 25 mg (has no administration in time range)  PARoxetine (PAXIL) tablet 10 mg (has no administration in time range)  predniSONE (DELTASONE) tablet 5 mg (has no administration in time range)  umeclidinium-vilanterol (ANORO ELLIPTA) 62.5-25 MCG/ACT 1 puff (has no administration in time range)  levalbuterol (XOPENEX) nebulizer solution 0.63 mg (has no administration in time range)  sodium chloride flush (NS) 0.9 % injection 3 mL (has no administration in time range)  acetaminophen (TYLENOL) tablet 650 mg (has no administration in time range)    Or  acetaminophen (TYLENOL) suppository 650 mg (has no administration in time range)  polyethylene glycol (MIRALAX / GLYCOLAX) packet 17 g (has no administration in time range)  apixaban (ELIQUIS) tablet 5 mg (has no administration in time range)  pantoprazole (PROTONIX) EC tablet 40 mg (has no administration in time range)    ED Course/ Medical Decision Making/ A&P                           This patient presents to the ED for concern of chest pain and shortness of breath, with right-sided pneumothorax noted on outpatient CT scan, this involves an extensive number of treatment options, and is a complaint that carries with it a high risk of complications and morbidity.  T  Co morbidities that complicate the patient evaluation  Lung cancer. COPD A-fib on blood thinners   Additional history obtained:  Additional history obtained from spouse at bedside External records from outside source obtained and reviewed including oncology follow-up with [name], outpatient CT scan completed today   Lab Tests:  I Ordered, and personally interpreted labs.  The pertinent results include: No leukocytosis, stable hemoglobin, mild hyponatremia but no  other significant electrolyte derangements, normal renal function, negative COVID and flu testing   Imaging Studies ordered:  I ordered imaging studies including bedside portable chest x-ray, and outpatient CT chest abdomen pelvis I independently visualized and interpreted imaging which showed large right-sided pneumothorax that approaches 50%, does not appear to be under tension I agree with the radiologist interpretation   Cardiac Monitoring:  The patient was maintained on a cardiac monitor.  I personally viewed and interpreted the cardiac monitored which showed an underlying rhythm of: Normal sinus rhythm   Critical Interventions:  Right-sided chest tube placed by Dr. Tamala Julian with critical care   Consultations Obtained:  I requested consultation with the intensivist, Dr. Tamala Julian,  and discussed lab and imaging findings as well as pertinent plan - they will come down and place chest tube and manage chest tube, recommend hospitalist admission   Problem List / ED Course:  Spontaneous pneumothorax, improving after chest tube placement, repeat chest x-ray shows now small pneumothorax.   Reevaluation:  After the interventions noted above, I reevaluated the patient and found that they have :improved    Dispostion:  After consideration of the diagnostic results and the patients response to treatment, I feel that the patent would benefit from admission.  Case discussed with Dr. Trilby Drummer with [facility] hospitalist including pertinent labs, imaging and plan, he will see and admit the patient.         Final Clinical Impression(s) / ED Diagnoses Final diagnoses:  Spontaneous pneumothorax    Rx / DC Orders ED Discharge Orders     None         Janet Berlin 03/30/21 2013    Lacretia Leigh, MD 03/31/21 2159

## 2021-03-30 NOTE — ED Triage Notes (Signed)
Patient said he went to [place] imaging for CT scan and was told he had a 'collapsed lung and it is leaking.' Reports feeling a pop Saturday after picking something up and being SOB after that. 3L [place] at baseline.

## 2021-03-30 NOTE — Procedures (Signed)
Insertion of Chest Tube Procedure Note  John [name]  [id number]  07-12-1960  Date:03/30/21  Time:6:21 PM    Provider Performing: Candee Furbish   Procedure: Pleural Catheter Insertion w/ Imaging Guidance [phone])  Indication(s) Pneumothorax  Consent Risks of the procedure as well as the alternatives and risks of each were explained to the patient and/or caregiver.  Consent for the procedure was obtained and is signed in the bedside chart  Anesthesia Topical only with 1% lidocaine    Time Out Verified patient identification, verified procedure, site/side was marked, verified correct patient position, special equipment/implants available, medications/allergies/relevant history reviewed, required imaging and test results available.   Sterile Technique Maximal sterile technique including full sterile barrier drape, hand hygiene, sterile gown, sterile gloves, mask, hair covering, sterile ultrasound probe cover (if used).   Procedure Description Ultrasound used to identify appropriate pleural anatomy for placement and overlying skin marked. Area of placement cleaned and draped in sterile fashion.  A 14 French pigtail pleural catheter was placed into the right pleural space using Seldinger technique. Appropriate return of air was obtained.  The tube was connected to atrium and placed on -20 cm H2O wall suction.   Complications/Tolerance None; patient tolerated the procedure well. Chest X-ray is ordered to verify placement.   EBL Minimal  Specimen(s) none

## 2021-03-30 NOTE — Consult Note (Signed)
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  Patient does admit that he felt a pop Saturday and has been more SOB with pleurisy since.  Also some DOE but wears 2LPM at baseline.  PCCM consulted to assist management of symptomatic pneumothorax.  Pertinent  Medical History  GERD PAF on AC Lung cancer as above Rheumatoid arthritis  Significant Hospital Events: Including procedures, antibiotic start and stop dates in addition to other pertinent events   2/13 Admit, pigtail  Interim History / Subjective:  Consulted  Objective   Blood pressure (!) 139/93, pulse 97, temperature 98.5 F (36.9 C), temperature source Oral, resp. rate (!) 22, SpO2 100 %.       No intake or output data in the 24 hours ending 03/30/21 1810 There were no vitals filed for this visit.  Examination: General: no distress laying in bed HENT: MMM, trachea midline Lungs: absent breath sounds on R, clear left, mildly tachypneic Cardiovascular: borderline tachycardic, ext warm Abdomen: soft, +BS Extremities: no edema Neuro: moves all 4 ext to command Psych: Aox3, good insight  Resolved Hospital Problem list   N/a  Assessment & Plan:  Right symptomatic secondary pneumothorax in setting of squamous cell lung cancer, scarring from immunotherapy/radiation.  COPD not in flare  - Pigtail to -20 cmH2O - Okay to travel on waterseal - Fine to continue [facility] - Will follow with you  Best Practice (right click and "Reselect all  SmartList Selections" daily)  Per primary  Labs   CBC: Recent Labs  Lab 03/30/21 1648  WBC 6.1  NEUTROABS 4.6  HGB 10.9*  HCT 32.9*  MCV 87.7  PLT 761    Basic Metabolic Panel: Recent Labs  Lab 03/30/21 1648  NA 133*  K 4.0  CL 99  CO2 25  GLUCOSE 109*  BUN 15  CREATININE 1.09  CALCIUM 8.7*   GFR: CrCl cannot be calculated (Unknown ideal weight.). Recent Labs  Lab 03/30/21 1648  WBC 6.1    Liver Function Tests: No results for input(s): AST, ALT, ALKPHOS, BILITOT, PROT, ALBUMIN in the last 168 hours. No results for input(s): LIPASE, AMYLASE in the last 168 hours. No results for input(s): AMMONIA in the last 168 hours.  ABG    Component Value Date/Time   HCO3 27.5 12/22/2020 1002   TCO2 27 12/22/2020 1035   O2SAT 37.2 12/22/2020 1002     Coagulation Profile: No results for input(s): INR, PROTIME in the last 168 hours.  Cardiac Enzymes: No results for input(s): CKTOTAL, CKMB, CKMBINDEX, TROPONINI in the last 168 hours.  HbA1C: No results found for: HGBA1C  CBG: No results for input(s): GLUCAP in the last 168 hours.  Review of Systems:    Positive Symptoms in bold:  Constitutional fevers, chills, weight loss, fatigue, anorexia, malaise  Eyes decreased vision, double vision, eye irritation  Ears, Nose, Mouth, Throat sore throat, trouble swallowing, sinus congestion  Cardiovascular chest pain, paroxysmal nocturnal dyspnea, lower ext edema, palpitations   Respiratory SOB, cough, DOE, hemoptysis, wheezing  Gastrointestinal nausea, vomiting, diarrhea  Genitourinary burning with  urination, trouble urinating  Musculoskeletal joint aches, joint swelling, back pain  Integumentary  rashes, skin lesions  Neurological focal weakness, focal numbness, trouble speaking, headaches  Psychiatric depression, anxiety, confusion  Endocrine polyuria, polydipsia, cold intolerance, heat intolerance  Hematologic abnormal bruising, abnormal bleeding, unexplained nose  bleeds  Allergic/Immunologic recurrent infections, hives, swollen lymph nodes     Past Medical History:  He,  has a past medical history of Dyspnea, Family history of adverse reaction to anesthesia, GERD (gastroesophageal reflux disease), History of radiation therapy (04/24/2020-05/16/2020), PAF (paroxysmal atrial fibrillation) ([place]), Rheumatoid aortitis, and Squamous cell lung cancer ([place]).   Surgical History:   Past Surgical History:  Procedure Laterality Date   BRONCHIAL BRUSHINGS  03/27/2020   Procedure: BRONCHIAL BRUSHINGS;  Surgeon: [name], MD;  Location: [place];  Service: Cardiopulmonary;;   BUBBLE STUDY  10/13/2020   Procedure: BUBBLE STUDY;  Surgeon: Pixie Casino, MD;  Location: [place];  Service: Cardiovascular;;   CARDIOVERSION N/A 10/13/2020   Procedure: CARDIOVERSION;  Surgeon: Pixie Casino, MD;  Location: [facility] ENDOSCOPY;  Service: Cardiovascular;  Laterality: N/A;   CATARACT EXTRACTION  2016   at [place]  03/27/2020   Procedure: FINE NEEDLE ASPIRATION;  Surgeon: [name], MD;  Location: [place];  Service: Cardiopulmonary;;   HIP SURGERY Left    TEE WITHOUT CARDIOVERSION N/A 10/13/2020   Procedure: TRANSESOPHAGEAL ECHOCARDIOGRAM (TEE);  Surgeon: Pixie Casino, MD;  Location: [place];  Service: Cardiovascular;  Laterality: N/A;   VIDEO BRONCHOSCOPY WITH ENDOBRONCHIAL ULTRASOUND N/A 03/27/2020   Procedure: VIDEO BRONCHOSCOPY WITH ENDOBRONCHIAL ULTRASOUND;  Surgeon: [name], MD;  Location: [place];  Service: Cardiopulmonary;  Laterality: N/A;     Social History:   reports that he quit smoking about 5 years ago. His smoking use included cigarettes. He has a 30.00 pack-year smoking history. He has never used smokeless tobacco. He reports that he does not currently use alcohol. He reports that he does not use drugs.   Family History:  His family history includes Arrhythmia in his mother; Heart disease in  his father.   Allergies Allergies  Allergen Reactions   Albuterol Other (See Comments)    Patient has had episode of atrial fib following administration of albuterol (tolerates Xopenex well)     Home Medications  Prior to Admission medications   Medication Sig Start Date End Date Taking? Authorizing Provider  amiodarone (PACERONE) 200 MG tablet Take 1 tablet (200 mg total) by mouth daily. 01/05/21 03/13/21  Fenton, Clint R, PA  apixaban (ELIQUIS) 5 MG TABS tablet Take 1 tablet (5 mg total) by mouth 2 (two) times daily. 01/05/21   Fenton, Clint R, PA  atorvastatin (LIPITOR) 10 MG tablet Take 1 tablet (10 mg total) by mouth daily. 11/19/20 03/13/21  Imogene Burn, PA-C  dexamethasone (DECADRON) 4 MG tablet Take 2 tablets (8 mg total) by mouth 2 (two) times daily. Start the day before Taxotere. Then daily after chemo for 2 days. 01/29/21   Volanda Napoleon, MD  levalbuterol [name] HFA) 45 MCG/ACT inhaler Inhale 2 puffs into the lungs every 4 (four) hours as needed for wheezing. 06/11/20   [name], MD  levalbuterol Penne Lash) 0.63 MG/3ML nebulizer solution Take 3 mLs (0.63 mg total) by nebulization every 8 (eight) hours as needed for wheezing or shortness of breath. 01/29/21   [name], MD  LORazepam (ATIVAN) 0.5 MG tablet Take 1 tablet (0.5 mg total) by mouth every  6 (six) hours as needed (Nausea or vomiting). 01/29/21   Volanda Napoleon, MD  metoprolol succinate (TOPROL XL) 25 MG 24 hr tablet Take one tablet by mouth daily 01/05/21   Fenton, Clint R, PA  ondansetron (ZOFRAN) 8 MG tablet Take 1 tablet (8 mg total) by mouth 2 (two) times daily as needed for refractory nausea / vomiting. 01/29/21   Volanda Napoleon, MD  PARoxetine (PAXIL) 10 MG tablet Take 10 mg by mouth daily. 01/25/20   [provider]  predniSONE (DELTASONE) 5 MG tablet Take 1 tablet (5 mg total) by mouth daily. 03/13/21   Volanda Napoleon, MD  PRILOSEC OTC 20 MG tablet Take 20 mg by mouth at bedtime.  11/13/19   [provider]  prochlorperazine (COMPAZINE) 10 MG tablet Take 1 tablet (10 mg total) by mouth every 6 (six) hours as needed (Nausea or vomiting). Patient not taking: Reported on 03/13/2021 01/29/21   Volanda Napoleon, MD  umeclidinium-vilanterol [facility] ELLIPTA) 62.5-25 MCG/ACT AEPB Inhale 1 puff into the lungs daily. 01/29/21 04/29/21  [name], MD

## 2021-03-30 NOTE — H&P (Signed)
History and Physical   John [name] XBL:[id number] DOB: [birth year] DOA: 03/30/2021  PCP: [name], MD   Patient coming from: Home/[place] imaging  Chief Complaint: Abnormal imaging, shortness of breath  HPI: John [name] is a 61 y.o. male with medical history significant of squamous cell carcinoma of the lung, RA, atrial fibrillation, orthostasis, neutropenia, leukopenia, COPD, asthma, chronic oxygen use presenting with abnormal imaging and shortness of breath.  Patient was getting a CT of the chest abdomen pelvis today for oncology follow-up when he was notified that he had a collapsed lung and that he needs to go to the ED for further evaluation.  After hearing this he did note that he felt a pop on Saturday after picking something up and then felt shortness of breath afterwards.  Also had some pleuritic pain since that time.  He denies fevers, chills, abdominal pain, constipation, diarrhea, nausea, vomiting.  ED Course: Vital signs in the ED significant for respiratory rate in the 20s on 4 L to maintain oxygen sats initially he is on chronic 2 to 3 L with exertion at home.  Pulse is in the 90s to 100s.  Lab work-up showed BMP with sodium 133, calcium 8.7.  CBC showed hemoglobin 10.9 which is just mildly down from previous baseline of 11.5.  Respiratory panel confirmed COVID-negative.  As above previous CT did demonstrate right pneumothorax.  Chest x-ray here redemonstrated large right pneumothorax around 50%.  With underlying disease in the background.  Repeat chest x-ray after tube placement is pending.  Patient had a chest tube placed in the ED by critical care who is consulting.  Review of Systems: As per HPI otherwise all other systems reviewed and are negative.  Past Medical History:  Diagnosis Date   Atrial fibrillation with RVR ([place]) 05/12/2020   Dyspnea    Family history of adverse reaction to anesthesia    brother with seizures had episode under anesthesia.  Patient  has seizures as well.   GERD (gastroesophageal reflux disease)    History of radiation therapy 04/24/2020-05/16/2020   IMRT to right lung     Dr [name]   PAF (paroxysmal atrial fibrillation) ([place])    CHADS2VSAC score 0   Rheumatoid aortitis    Sepsis ([place]) 10/06/2020   Squamous cell lung cancer [facility])     Past Surgical History:  Procedure Laterality Date   BRONCHIAL BRUSHINGS  03/27/2020   Procedure: BRONCHIAL BRUSHINGS;  Surgeon: [name], MD;  Location: [place];  Service: Cardiopulmonary;;   BUBBLE STUDY  10/13/2020   Procedure: BUBBLE STUDY;  Surgeon: Pixie Casino, MD;  Location: [place];  Service: Cardiovascular;;   CARDIOVERSION N/A 10/13/2020   Procedure: CARDIOVERSION;  Surgeon: Pixie Casino, MD;  Location: [facility] ENDOSCOPY;  Service: Cardiovascular;  Laterality: N/A;   CATARACT EXTRACTION  2016   at [place]  03/27/2020   Procedure: FINE NEEDLE ASPIRATION;  Surgeon: [name], MD;  Location: [place];  Service: Cardiopulmonary;;   HIP SURGERY Left    TEE WITHOUT CARDIOVERSION N/A 10/13/2020   Procedure: TRANSESOPHAGEAL ECHOCARDIOGRAM (TEE);  Surgeon: Pixie Casino, MD;  Location: [place];  Service: Cardiovascular;  Laterality: N/A;   VIDEO BRONCHOSCOPY WITH ENDOBRONCHIAL ULTRASOUND N/A 03/27/2020   Procedure: VIDEO BRONCHOSCOPY WITH ENDOBRONCHIAL ULTRASOUND;  Surgeon: [name], MD;  Location: [place];  Service: Cardiopulmonary;  Laterality: N/A;    Social History  reports that he quit smoking about 5 years ago. His smoking use  included cigarettes. He has a 30.00 pack-year smoking history. He has never used smokeless tobacco. He reports that he does not currently use alcohol. He reports that he does not use drugs.  Allergies  Allergen Reactions   Albuterol Other (See Comments)    Patient has had episode of atrial fib following administration of albuterol (tolerates Xopenex well)    Family History   Problem Relation Age of Onset   Arrhythmia Mother        has PPM   Heart disease Father        Died at 47, started in his 57s, heart attacks, had PPM and ICD  Reviewed on admission  Prior to Admission medications   Medication Sig Start Date End Date Taking? Authorizing Provider  amiodarone (PACERONE) 200 MG tablet Take 200 mg by mouth at bedtime.   Yes [provider]  apixaban (ELIQUIS) 5 MG TABS tablet Take 1 tablet (5 mg total) by mouth 2 (two) times daily. 01/05/21  Yes Fenton, Clint R, PA  dexamethasone (DECADRON) 4 MG tablet Take 2 tablets (8 mg total) by mouth 2 (two) times daily. Start the day before Taxotere. Then daily after chemo for 2 days. 01/29/21  Yes Ennever, Rudell Cobb, MD  levalbuterol (XOPENEX) 0.63 MG/3ML nebulizer solution Take 3 mLs (0.63 mg total) by nebulization every 8 (eight) hours as needed for wheezing or shortness of breath. 01/29/21  Yes [name], MD  LORazepam (ATIVAN) 0.5 MG tablet Take 1 tablet (0.5 mg total) by mouth every 6 (six) hours as needed (Nausea or vomiting). 01/29/21  Yes Volanda Napoleon, MD  metoprolol succinate (TOPROL XL) 25 MG 24 hr tablet Take one tablet by mouth daily 01/05/21  Yes Fenton, Clint R, PA  ondansetron (ZOFRAN) 8 MG tablet Take 1 tablet (8 mg total) by mouth 2 (two) times daily as needed for refractory nausea / vomiting. 01/29/21  Yes Ennever, Rudell Cobb, MD  PARoxetine (PAXIL) 10 MG tablet Take 10 mg by mouth at bedtime. 01/25/20  Yes [provider]  predniSONE (DELTASONE) 5 MG tablet Take 1 tablet (5 mg total) by mouth daily. 03/13/21  Yes Ennever, Rudell Cobb, MD  PRILOSEC OTC 20 MG tablet Take 20 mg by mouth at bedtime. 11/13/19  Yes [provider]  umeclidinium-vilanterol (ANORO ELLIPTA) 62.5-25 MCG/ACT AEPB Inhale 1 puff into the lungs daily. 01/29/21 04/29/21 Yes [name], MD  amiodarone (PACERONE) 200 MG tablet Take 1 tablet (200 mg total) by mouth daily. 01/05/21 03/13/21  Fenton, Clint R, PA   atorvastatin (LIPITOR) 10 MG tablet Take 1 tablet (10 mg total) by mouth daily. 11/19/20 03/13/21  Imogene Burn, PA-C  levalbuterol [facility] HFA) 45 MCG/ACT inhaler Inhale 2 puffs into the lungs every 4 (four) hours as needed for wheezing. Patient not taking: Reported on 03/30/2021 06/11/20   [name], MD  prochlorperazine (COMPAZINE) 10 MG tablet Take 1 tablet (10 mg total) by mouth every 6 (six) hours as needed (Nausea or vomiting). Patient not taking: Reported on 03/13/2021 01/29/21   Volanda Napoleon, MD    Physical Exam: Vitals:   03/30/21 1900 03/30/21 1915 03/30/21 1945 03/30/21 2000  BP: 121/85 123/83 117/84 112/84  Pulse: 80 81 76 83  Resp: 20 (!) 25 (!) 21 (!) 24  Temp:      TempSrc:      SpO2: 100% 98% 99% 100%    Physical Exam Constitutional:      General: He is not in acute distress.  Appearance: Normal appearance.  HENT:     Head: Normocephalic and atraumatic.     Mouth/Throat:     Mouth: Mucous membranes are moist.     Pharynx: Oropharynx is clear.  Eyes:     Extraocular Movements: Extraocular movements intact.     Pupils: Pupils are equal, round, and reactive to light.  Cardiovascular:     Rate and Rhythm: Normal rate and regular rhythm.     Pulses: Normal pulses.     Heart sounds: Normal heart sounds.  Pulmonary:     Effort: Pulmonary effort is normal. No respiratory distress.     Breath sounds: Normal breath sounds.     Comments: Chest tube in place on R. Wheezes on Right Abdominal:     General: Bowel sounds are normal. There is no distension.     Palpations: Abdomen is soft.     Tenderness: There is no abdominal tenderness.  Musculoskeletal:        General: No swelling or deformity.  Skin:    General: Skin is warm and dry.  Neurological:     General: No focal deficit present.     Mental Status: Mental status is at baseline.   Labs on Admission: I have personally reviewed following labs and imaging studies  CBC: Recent Labs  Lab  03/30/21 1648  WBC 6.1  NEUTROABS 4.6  HGB 10.9*  HCT 32.9*  MCV 87.7  PLT 478    Basic Metabolic Panel: Recent Labs  Lab 03/30/21 1648  NA 133*  K 4.0  CL 99  CO2 25  GLUCOSE 109*  BUN 15  CREATININE 1.09  CALCIUM 8.7*    GFR: CrCl cannot be calculated (Unknown ideal weight.).  Liver Function Tests: No results for input(s): AST, ALT, ALKPHOS, BILITOT, PROT, ALBUMIN in the last 168 hours.  Urine analysis:    Component Value Date/Time   COLORURINE YELLOW 12/22/2020 1213   APPEARANCEUR CLEAR 12/22/2020 1213   LABSPEC 1.029 12/22/2020 1213   PHURINE 6.0 12/22/2020 1213   GLUCOSEU NEGATIVE 12/22/2020 1213   HGBUR NEGATIVE 12/22/2020 1213   BILIRUBINUR NEGATIVE 12/22/2020 1213   KETONESUR 5 (A) 12/22/2020 1213   PROTEINUR NEGATIVE 12/22/2020 1213   NITRITE NEGATIVE 12/22/2020 1213   LEUKOCYTESUR NEGATIVE 12/22/2020 1213    Radiological Exams on Admission: DG Chest 1 View  Result Date: 03/30/2021 CLINICAL DATA:  Status post chest tube insertion. EXAM: CHEST  1 VIEW COMPARISON:  Chest x-ray 03/30/2021 5:35 p.m. FINDINGS: The heart and mediastinal contours are unchanged. Aortic calcification. Low lung volume. Bilateral mid to lower lung zone interstitial and airspace opacities. No pulmonary edema. Interval placement of a right chest tube with pigtail overlying the right costophrenic angle. Interval decrease in size of a now small right pneumothorax. No pneumothorax. No acute osseous abnormality. IMPRESSION: 1. Interval placement of a right chest tube with pigtail overlying the right costophrenic angle. Interval decrease in size of a now small right pneumothorax. 2. Bilateral mid to lower lung zone interstitial and airspace opacities. 3.  Aortic Atherosclerosis (ICD10-I70.0). Electronically Signed   By: Iven Finn M.D.   On: 03/30/2021 19:03   CT CHEST ABDOMEN PELVIS W CONTRAST  Result Date: 03/30/2021 CLINICAL DATA:  Metastatic small-cell lung cancer. On  chemotherapy. Evaluate treatment response. Shortness of breath EXAM: CT CHEST, ABDOMEN, AND PELVIS WITH CONTRAST TECHNIQUE: Multidetector CT imaging of the chest, abdomen and pelvis was performed following the standard protocol during bolus administration of intravenous contrast. RADIATION DOSE REDUCTION: This exam was performed  according to the departmental dose-optimization program which includes automated exposure control, adjustment of the mA and/or kV according to patient size and/or use of iterative reconstruction technique. CONTRAST:  168mL ISOVUE-300 IOPAMIDOL (ISOVUE-300) INJECTION 61% COMPARISON:  12/31/2020 chest CT.  Abdominopelvic CT 07/18/2020 FINDINGS: CT CHEST FINDINGS Cardiovascular: Aortic atherosclerosis. Normal heart size with lipomatous hypertrophy of the interatrial septum. Lad and left circumflex coronary artery calcification. Mediastinum/Nodes: No supraclavicular adenopathy. No mediastinal or hilar adenopathy. Lungs/Pleura: No pleural fluid. Development of a large right-sided pneumothorax. The previously described pneumomediastinum has resolved. Centrilobular emphysema. The right lung is poorly evaluated secondary to collapse. Suspect underlying fibrosis, with architectural distortion and scattered ground-glass. Right upper lobe or superior segment lower lobe dominant bulla/blebs on 63/4. Underlying centrilobular emphysema. 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 Musculoskeletal: No acute osseous abnormality. CT ABDOMEN PELVIS FINDINGS Hepatobiliary: Normal liver. 1.8 cm stone in the gallbladder neck. No acute cholecystitis or biliary duct dilatation. Pancreas: Pancreatic fatty replacement throughout the body and head. No duct dilatation or acute inflammation. Spleen: Minimal splenic hypoattenuation along the capsule on 62/2 likely represents sequelae of splenic infarct. Adrenals/Urinary Tract: Normal left adrenal  gland. Bilobed right adrenal mass measures 4.9 x 3.1 cm on 58/2 versus 5.8 x 3.0 cm on the prior exam. Too small to characterize interpolar left renal lesion. Normal right kidney. No hydronephrosis. Normal urinary bladder. Stomach/Bowel: Normal stomach, without wall thickening. Normal colon, appendix, and terminal ileum. Normal small bowel. Vascular/Lymphatic: Aortic atherosclerosis. No abdominopelvic adenopathy. Reproductive: Normal prostate. Other: Small fat containing left inguinal hernia. No significant free fluid. No evidence of omental or peritoneal disease. Tiny fat containing ventral abdominal wall hernia. Musculoskeletal: L2 mild compression deformity or Schmorl's node, similar. IMPRESSION: CT CHEST IMPRESSION 1. Large right-sided pneumothorax. 2. Suboptimal evaluation of the right lung. Suspect treatment related fibrosis, as before. 3. No thoracic adenopathy. 4. Coronary artery atherosclerosis. Aortic Atherosclerosis (ICD10-I70.0). Emphysema (ICD10-J43.9). CT ABDOMEN AND PELVIS IMPRESSION 1. 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 2. No new or progressive abdominopelvic metastasis. 3. Cholelithiasis. 4. Sequelae of splenic infarct. A call to the emergency room provider at [facility] (where patient is currently) is pending as of 6:08 p.m. Electronically Signed   By: Abigail Miyamoto M.D.   On: 03/30/2021 18:08   DG Chest Port 1 View  Result Date: 03/30/2021 CLINICAL DATA:  Pneumothorax EXAM: PORTABLE CHEST 1 VIEW COMPARISON:  01/12/2021, CT 12/31/2020, chest x-ray 05/12/2020, 10/10/2020 FINDINGS: Large cavitary lesion in the right mid lung. Underlying coarse interstitial opacity. Normal cardiac size with aortic atherosclerosis. Right-sided pneumothorax approaches 50%. Slight midline shift to the left. IMPRESSION: 1. Large right-sided pneumothorax approaches 50%. 2. There is underlying interstitial disease. Cavitary lesion in the right mid lung as noted on previous exam Critical  Value/emergent results were called by telephone at the time of interpretation on 03/30/2021 at 5:56 pm to provider [facility] , who verbally acknowledged these results. Electronically Signed   By: Donavan Foil M.D.   On: 03/30/2021 17:56    EKG: Not performed in the ED.  Assessment/Plan Principal Problem:   Pneumothorax Active Problems:   Stage 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 (HCC)   Persistent atrial fibrillation (HCC)   RA (rheumatoid arthritis) (HCC)   Pneumothorax > Patient presenting after pneumothorax noted incidentally on imaging ordered outpatient for cancer follow-up. > Patient does report hearing a pop after lifting something up on Saturday and having some shortness of breath and some pleuritic pain  since. > Has been on 3 to 4 L in the ED but is on 2 to 3 L with exertion at baseline. > Does have history of treatment for lung cancer that may have predisposed him to have pneumothorax. > Critical care consulting for pneumothorax and have placed chest tube in the ED, they are following. - Monitor in progressive unit - Appreciate critical care recommendations - Continue with chest tube as per critical care - Continue with supplemental oxygen - Pain control as needed  Hyponatremia > Mild hyponatremia incidentally noted in the ED at 133. - We will trend  Anemia > Has chronic anemia currently at 10.9 most recent baseline is around 1.5. - Continue to trend CBC  Atrial fibrillation - Continue home amiodarone and metoprolol - Continue Eliquis  Squamous cell carcinoma of the lung > Initially diagnosed last year, follows with [name] > Currently on pembrolizumab and Taxotere > Also has been prescribed Neulasta for cytopenia > No new disease noted on Imaging today, though R lung not visualized 2/2 pneumothorax - Continue to monitor  Rheumatoid arthritis - Continue daily prednisone  COPD Asthma (History of RVR with albuterol) - Continue home Anoro Ellipta -  Continue as needed level butyryl  DVT prophylaxis: Eliquis Code Status:   Full Family Communication:  Significant other updated at bedside Disposition Plan:   Patient is from:  Home  Anticipated DC to:  Home  Anticipated DC date:  1 to 3 days  Anticipated DC barriers: None  Consults called:  Critical care consulted in the ED and are following Admission status:  Observation, progressive  Severity of Illness: The appropriate patient status for this patient is OBSERVATION. Observation status is judged to be reasonable and necessary in order to provide the required intensity of service to ensure the patient's safety. The patient's presenting symptoms, physical exam findings, and initial radiographic and laboratory data in the context of their medical condition is felt to place them at decreased risk for further clinical deterioration. Furthermore, it is anticipated that the patient will be medically stable for discharge from the hospital within 2 midnights of admission.    Marcelyn Bruins MD [facility]  How to contact the [facility] Attending or Consulting provider [place] or covering provider during after hours [place], for this patient?   Check the care team in [facility] and look for a) attending/consulting [facility] provider listed and b) the [facility] team listed Log into [email or web address] and use [place]'s universal password to access. If you do not have the password, please contact the hospital operator. Locate the [facility] provider you are looking for under [facility] and page to a number that you can be directly reached. If you still have difficulty reaching the provider, please page the [facility] (Director on Call) for the Hospitalists listed on amion for assistance.  03/30/2021, 8:09 PM

## 2021-03-31 ENCOUNTER — Encounter (HOSPITAL_COMMUNITY): Payer: Self-pay | Admitting: Internal Medicine

## 2021-03-31 ENCOUNTER — Observation Stay (HOSPITAL_COMMUNITY): Payer: 59

## 2021-03-31 DIAGNOSIS — K219 Gastro-esophageal reflux disease without esophagitis: Secondary | ICD-10-CM | POA: Diagnosis present

## 2021-03-31 DIAGNOSIS — I4819 Other persistent atrial fibrillation: Secondary | ICD-10-CM | POA: Diagnosis present

## 2021-03-31 DIAGNOSIS — E876 Hypokalemia: Secondary | ICD-10-CM | POA: Diagnosis present

## 2021-03-31 DIAGNOSIS — J9611 Chronic respiratory failure with hypoxia: Secondary | ICD-10-CM

## 2021-03-31 DIAGNOSIS — Z8249 Family history of ischemic heart disease and other diseases of the circulatory system: Secondary | ICD-10-CM | POA: Diagnosis not present

## 2021-03-31 DIAGNOSIS — J9621 Acute and chronic respiratory failure with hypoxia: Secondary | ICD-10-CM | POA: Diagnosis present

## 2021-03-31 DIAGNOSIS — D649 Anemia, unspecified: Secondary | ICD-10-CM | POA: Diagnosis present

## 2021-03-31 DIAGNOSIS — I4891 Unspecified atrial fibrillation: Secondary | ICD-10-CM | POA: Diagnosis not present

## 2021-03-31 DIAGNOSIS — J8410 Pulmonary fibrosis, unspecified: Secondary | ICD-10-CM | POA: Diagnosis present

## 2021-03-31 DIAGNOSIS — C3411 Malignant neoplasm of upper lobe, right bronchus or lung: Secondary | ICD-10-CM | POA: Diagnosis present

## 2021-03-31 DIAGNOSIS — J9382 Other air leak: Secondary | ICD-10-CM | POA: Diagnosis present

## 2021-03-31 DIAGNOSIS — Z7901 Long term (current) use of anticoagulants: Secondary | ICD-10-CM | POA: Diagnosis not present

## 2021-03-31 DIAGNOSIS — Z87891 Personal history of nicotine dependence: Secondary | ICD-10-CM | POA: Diagnosis not present

## 2021-03-31 DIAGNOSIS — J939 Pneumothorax, unspecified: Secondary | ICD-10-CM | POA: Diagnosis not present

## 2021-03-31 DIAGNOSIS — J849 Interstitial pulmonary disease, unspecified: Secondary | ICD-10-CM | POA: Diagnosis not present

## 2021-03-31 DIAGNOSIS — Z9981 Dependence on supplemental oxygen: Secondary | ICD-10-CM | POA: Diagnosis not present

## 2021-03-31 DIAGNOSIS — J9311 Primary spontaneous pneumothorax: Secondary | ICD-10-CM | POA: Diagnosis not present

## 2021-03-31 DIAGNOSIS — Z79899 Other long term (current) drug therapy: Secondary | ICD-10-CM | POA: Diagnosis not present

## 2021-03-31 DIAGNOSIS — Z923 Personal history of irradiation: Secondary | ICD-10-CM | POA: Diagnosis not present

## 2021-03-31 DIAGNOSIS — J449 Chronic obstructive pulmonary disease, unspecified: Secondary | ICD-10-CM

## 2021-03-31 DIAGNOSIS — Z7952 Long term (current) use of systemic steroids: Secondary | ICD-10-CM | POA: Diagnosis not present

## 2021-03-31 DIAGNOSIS — Z20822 Contact with and (suspected) exposure to covid-19: Secondary | ICD-10-CM | POA: Diagnosis present

## 2021-03-31 DIAGNOSIS — E871 Hypo-osmolality and hyponatremia: Secondary | ICD-10-CM

## 2021-03-31 DIAGNOSIS — M069 Rheumatoid arthritis, unspecified: Secondary | ICD-10-CM | POA: Diagnosis present

## 2021-03-31 DIAGNOSIS — J9312 Secondary spontaneous pneumothorax: Secondary | ICD-10-CM | POA: Diagnosis not present

## 2021-03-31 DIAGNOSIS — C3491 Malignant neoplasm of unspecified part of right bronchus or lung: Secondary | ICD-10-CM | POA: Diagnosis not present

## 2021-03-31 DIAGNOSIS — Z888 Allergy status to other drugs, medicaments and biological substances status: Secondary | ICD-10-CM | POA: Diagnosis not present

## 2021-03-31 DIAGNOSIS — J9383 Other pneumothorax: Secondary | ICD-10-CM | POA: Diagnosis present

## 2021-03-31 DIAGNOSIS — C7971 Secondary malignant neoplasm of right adrenal gland: Secondary | ICD-10-CM | POA: Diagnosis present

## 2021-03-31 LAB — CBC
HCT: 32 % — ABNORMAL LOW (ref 39.0–52.0)
Hemoglobin: 10.4 g/dL — ABNORMAL LOW (ref 13.0–17.0)
MCH: 28.5 pg (ref 26.0–34.0)
MCHC: 32.5 g/dL (ref 30.0–36.0)
MCV: 87.7 fL (ref 80.0–100.0)
Platelets: 198 10*3/uL (ref 150–400)
RBC: 3.65 MIL/uL — ABNORMAL LOW (ref 4.22–5.81)
RDW: 16.9 % — ABNORMAL HIGH (ref 11.5–15.5)
WBC: 4.2 10*3/uL (ref 4.0–10.5)
nRBC: 0 % (ref 0.0–0.2)

## 2021-03-31 LAB — COMPREHENSIVE METABOLIC PANEL
ALT: 12 U/L (ref 0–44)
AST: 12 U/L — ABNORMAL LOW (ref 15–41)
Albumin: 3.1 g/dL — ABNORMAL LOW (ref 3.5–5.0)
Alkaline Phosphatase: 80 U/L (ref 38–126)
Anion gap: 8 (ref 5–15)
BUN: 12 mg/dL (ref 6–20)
CO2: 27 mmol/L (ref 22–32)
Calcium: 8.5 mg/dL — ABNORMAL LOW (ref 8.9–10.3)
Chloride: 99 mmol/L (ref 98–111)
Creatinine, Ser: 0.93 mg/dL (ref 0.61–1.24)
GFR, Estimated: >60 mL/min (ref >60)
Glucose, Bld: 97 mg/dL (ref 70–99)
Potassium: 3.3 mmol/L — ABNORMAL LOW (ref 3.5–5.1)
Sodium: 134 mmol/L — ABNORMAL LOW (ref 135–145)
Total Bilirubin: 0.6 mg/dL (ref 0.3–1.2)
Total Protein: 6.2 g/dL — ABNORMAL LOW (ref 6.5–8.1)

## 2021-03-31 MED ORDER — POTASSIUM CHLORIDE CRYS ER 20 MEQ PO TBCR
40.0000 meq | EXTENDED_RELEASE_TABLET | Freq: Two times a day (BID) | ORAL | Status: AC
  Administered 2021-03-31 (×2): 40 meq via ORAL
  Filled 2021-03-31 (×2): qty 2

## 2021-03-31 MED ORDER — ORAL CARE MOUTH RINSE
15.0000 mL | Freq: Two times a day (BID) | OROMUCOSAL | Status: DC
  Administered 2021-03-31 – 2021-04-03 (×4): 15 mL via OROMUCOSAL

## 2021-03-31 NOTE — Assessment & Plan Note (Addendum)
Patient uses 2 to 3 L of oxygen at baseline due to COPD and asthma.  Continue bronchodilators -Acute worsening suspected due to setting of pneumothorax - Has been weaned back to baseline of 3 L

## 2021-03-31 NOTE — Progress Notes (Signed)
PROGRESS NOTE    John [name]  LNL:[id number] DOB: 04-18-1960 DOA: 03/30/2021 PCP: [name], MD    Brief Narrative:  John [name] is a 61 y.o. male with past medical history significant for squamous cell carcinoma of the lung, rheumatoid arthritis, atrial fibrillation, COPD, chronic respiratory failure presented to the hospital with abnormal imaging and shortness of breath.  Patient had been to oncology for follow-up appointment he had a CT scan of the chest abdomen pelvis which demonstrated right-sided pneumothorax around 50%.  Patient had felt a pop few days back while picking something and subsequently felt short of breath.  In the ED, patient was tachypneic.  At baseline patient uses 2 to 3 L of oxygen at home.  Pulmonary critical care was consulted from the ED and patient underwent chest tube placement and was admitted to the hospital for further evaluation and treatment.     Assessment and Plan: * Pneumothorax- (present on admission) Spontaneous pneumothorax.  History of right lung cancer stage IV.  PCCM following and patient is status post chest tube placement.  Further management as per PCCM.  Patient denies overt pain or dyspnea at this time.  No leukocytosis or fever at this time.  Stage 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 ([place])- (present on admission) Follows up with [name] as outpatient.Currently on pembrolizumab and Taxotere as outpatient.  Chronic respiratory failure with hypoxia ([place])- (present on admission) Patient uses 2 to 3 L of oxygen at baseline due to COPD and asthma.  Continue bronchodilators..    RA (rheumatoid arthritis) ([place])- (present on admission) On daily prednisone at home  Persistent atrial fibrillation ([place])- (present on admission) On amiodarone metoprolol and Eliquis.  We will continue while in the hospital  Hypokalemia- (present on admission) Potassium of 3.3 today.  We will replace with oral potassium 40x2 today.  Check labs in  AM.  Hyponatremia- (present on admission) Mild.  We will continue to monitor.  Latest sodium of 134.     DVT prophylaxis:  apixaban (ELIQUIS) tablet 5 mg   Code Status:     Code Status: Full Code  Disposition: Home  Status is: Inpatient Remains inpatient appropriate because: Pneumothorax status post chest tube placement    Family Communication:  Spoke with the patient's spouse at bedside  Consultants:  PCCM  Procedures:  Right chest tube placement on 03/30/2021.  Antimicrobials:  None  Anti-infectives (From admission, onward)    None        Subjective: Today, patient was seen and examined at bedside.  Patient denies overt pain, nausea, vomiting, increasing shortness of breath or dyspnea.  Patient's wife at bedside.  Objective: Vitals:   03/30/21 2000 03/30/21 2101 03/31/21 0117 03/31/21 0536  BP: 112/84 (!) 129/94 120/83 119/84  Pulse: 83 90 81 84  Resp: (!) 24 20 18 16   Temp:  98.3 F (36.8 C) 97.9 F (36.6 C) (!) 97.5 F (36.4 C)  TempSrc:  Oral Oral Oral  SpO2: 100% 94% 98% 100%  Weight:  104.3 kg    Height:  5\' 11"  (1.803 m)      Intake/Output Summary (Last 24 hours) at 03/31/2021 1135 Last data filed at 03/31/2021 0700 Gross per 24 hour  Intake 480 ml  Output 825 ml  Net -345 ml   Filed Weights   03/30/21 2101  Weight: 104.3 kg    Physical Examination:  General:  Average built, not in obvious distress, on nasal cannula oxygen HENT:   No scleral pallor or icterus  noted. Oral mucosa is moist.  Chest: Diminished breath sounds bilaterally. CVS: S1 &S2 heard. No murmur.  Regular rate and rhythm. Abdomen: Soft, nontender, nondistended.  Bowel sounds are heard.   Extremities: No cyanosis, clubbing or edema.  Peripheral pulses are palpable. Psych: Alert, awake and oriented, normal mood CNS:  No cranial nerve deficits.  Power equal in all extremities.   Skin: Warm and dry.  No rashes noted.  Data Reviewed:   CBC: Recent Labs  Lab  03/30/21 1648 03/31/21 0408  WBC 6.1 4.2  NEUTROABS 4.6  --   HGB 10.9* 10.4*  HCT 32.9* 32.0*  MCV 87.7 87.7  PLT 245 166    Basic Metabolic Panel: Recent Labs  Lab 03/30/21 1648 03/31/21 0408  NA 133* 134*  K 4.0 3.3*  CL 99 99  CO2 25 27  GLUCOSE 109* 97  BUN 15 12  CREATININE 1.09 0.93  CALCIUM 8.7* 8.5*    Liver Function Tests: Recent Labs  Lab 03/31/21 0408  AST 12*  ALT 12  ALKPHOS 80  BILITOT 0.6  PROT 6.2*  ALBUMIN 3.1*     Radiology Studies: DG Chest 1 View  Result Date: 03/31/2021 CLINICAL DATA:  Pneumothorax. EXAM: CHEST  1 VIEW COMPARISON:  March 30, 2021. FINDINGS: Stable cardiomediastinal silhouette. Stable right midlung and left basilar opacities are noted. Stable small pneumothorax is noted laterally in the right hemithorax. Right-sided chest tube is unchanged. Bony thorax is unremarkable. IMPRESSION: Stable position of right-sided chest tube with stable small right lateral pneumothorax. Stable bilateral lung opacities are noted. Electronically Signed   By: Marijo Conception M.D.   On: 03/31/2021 08:15   DG Chest 1 View  Result Date: 03/30/2021 CLINICAL DATA:  Status post chest tube insertion. EXAM: CHEST  1 VIEW COMPARISON:  Chest x-ray 03/30/2021 5:35 p.m. FINDINGS: The heart and mediastinal contours are unchanged. Aortic calcification. Low lung volume. Bilateral mid to lower lung zone interstitial and airspace opacities. No pulmonary edema. Interval placement of a right chest tube with pigtail overlying the right costophrenic angle. Interval decrease in size of a now small right pneumothorax. No pneumothorax. No acute osseous abnormality. IMPRESSION: 1. Interval placement of a right chest tube with pigtail overlying the right costophrenic angle. Interval decrease in size of a now small right pneumothorax. 2. Bilateral mid to lower lung zone interstitial and airspace opacities. 3.  Aortic Atherosclerosis (ICD10-I70.0). Electronically Signed   By:  Iven Finn M.D.   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 Evaluate treatment response. Shortness of breath EXAM: CT CHEST, ABDOMEN, AND PELVIS WITH CONTRAST TECHNIQUE: Multidetector CT imaging of the chest, abdomen and pelvis was performed following the standard protocol during bolus administration of intravenous contrast. RADIATION DOSE REDUCTION: This exam was performed according to the departmental dose-optimization program which includes automated exposure control, adjustment of the mA and/or kV according to patient size and/or use of iterative reconstruction technique. CONTRAST:  164mL ISOVUE-300 IOPAMIDOL (ISOVUE-300) INJECTION 61% COMPARISON:  12/31/2020 chest CT.  Abdominopelvic CT 07/18/2020 FINDINGS: CT CHEST FINDINGS Cardiovascular: Aortic atherosclerosis. Normal heart size with lipomatous hypertrophy of the interatrial septum. Lad and left circumflex coronary artery calcification. Mediastinum/Nodes: No supraclavicular adenopathy. No mediastinal or hilar adenopathy. Lungs/Pleura: No pleural fluid. Development of a large right-sided pneumothorax. The previously described pneumomediastinum has resolved. Centrilobular emphysema. The right lung is poorly evaluated secondary to collapse. Suspect underlying fibrosis, with architectural  distortion and scattered ground-glass. Right upper lobe or superior segment lower lobe dominant bulla/blebs on 63/4. Underlying centrilobular emphysema. 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 Musculoskeletal: No acute osseous abnormality. CT ABDOMEN PELVIS FINDINGS Hepatobiliary: Normal liver. 1.8 cm stone in the gallbladder neck. No acute cholecystitis or biliary duct dilatation. Pancreas: Pancreatic fatty replacement throughout the body and head. No duct dilatation or acute  inflammation. Spleen: Minimal splenic hypoattenuation along the capsule on 62/2 likely represents sequelae of splenic infarct. Adrenals/Urinary Tract: Normal left adrenal gland. Bilobed right adrenal mass measures 4.9 x 3.1 cm on 58/2 versus 5.8 x 3.0 cm on the prior exam. Too small to characterize interpolar left renal lesion. Normal right kidney. No hydronephrosis. Normal urinary bladder. Stomach/Bowel: Normal stomach, without wall thickening. Normal colon, appendix, and terminal ileum. Normal small bowel. Vascular/Lymphatic: Aortic atherosclerosis. No abdominopelvic adenopathy. Reproductive: Normal prostate. Other: Small fat containing left inguinal hernia. No significant free fluid. No evidence of omental or peritoneal disease. Tiny fat containing ventral abdominal wall hernia. Musculoskeletal: L2 mild compression deformity or Schmorl's node, similar. IMPRESSION: CT CHEST IMPRESSION 1. Large right-sided pneumothorax. 2. Suboptimal evaluation of the right lung. Suspect treatment related fibrosis, as before. 3. No thoracic adenopathy. 4. Coronary artery atherosclerosis. Aortic Atherosclerosis (ICD10-I70.0). Emphysema (ICD10-J43.9). CT ABDOMEN AND PELVIS IMPRESSION 1. 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 2. No new or progressive abdominopelvic metastasis. 3. Cholelithiasis. 4. Sequelae of splenic infarct. A call to the emergency room provider at [facility] (where patient is currently) is pending as of 6:08 p.m. Electronically Signed   By: Abigail Miyamoto M.D.   On: 03/30/2021 18:08   DG Chest Port 1 View  Result Date: 03/30/2021 CLINICAL DATA:  Pneumothorax EXAM: PORTABLE CHEST 1 VIEW COMPARISON:  01/12/2021, CT 12/31/2020, chest x-ray 05/12/2020, 10/10/2020 FINDINGS: Large cavitary lesion in the right mid lung. Underlying coarse interstitial opacity. Normal cardiac size with aortic atherosclerosis. Right-sided pneumothorax approaches 50%. Slight midline shift to the left. IMPRESSION: 1.  Large right-sided pneumothorax approaches 50%. 2. There is underlying interstitial disease. Cavitary lesion in the right mid lung as noted on previous exam Critical Value/emergent results were called by telephone at the time of interpretation on 03/30/2021 at 5:56 pm to provider [facility] , who verbally acknowledged these results. Electronically Signed   By: Donavan Foil M.D.   On: 03/30/2021 17:56      LOS: 0 days    Flora Lipps, MD [facility] 03/31/2021, 11:35 AM

## 2021-03-31 NOTE — Assessment & Plan Note (Addendum)
Mild.

## 2021-03-31 NOTE — Assessment & Plan Note (Addendum)
Follows up with [name] as outpatient.Currently on pembrolizumab and Taxotere as outpatient. -Treatment to be postponed pending recovery from pneumothorax

## 2021-03-31 NOTE — Progress Notes (Signed)
03/31/2021  I have seen and evaluated the patient for multifactorial secondary pneumothorax.  S:  No events, breathing comfortable  O: Blood pressure 119/84, pulse 84, temperature (!) 97.5 F (36.4 C), temperature source Oral, resp. rate 16, height 5\' 11"  (1.803 m), weight 104.3 kg, SpO2 100 %.  No distress Better air movement on R R atrium with tidaling and 1+ air leak on suction On waterseal 1+ only with coughing  K low repleting  A:  Right secondary pneumothorax in patient with baseline severe fibrotic lung disease (NSCLC, radiation pneumonitis, immune checkpoint inhibitor pneumonitis)  P:  - Chest tube to waterseal - AM CXR - Needs more time - Oncoming physician Dr. Valeta Harms updated on case  Erskine Emery MD [facility] epic messenger for cross cover needs If after hours, please call E-link

## 2021-03-31 NOTE — Assessment & Plan Note (Signed)
On daily prednisone at home

## 2021-03-31 NOTE — Hospital Course (Addendum)
John [name] is a 61 y.o. male with past medical history significant for squamous cell carcinoma of the lung, rheumatoid arthritis, atrial fibrillation, COPD, chronic respiratory failure presented to the hospital with abnormal imaging and shortness of breath.  Patient had been to oncology for follow-up appointment he had a CT scan of the chest abdomen pelvis which demonstrated right-sided pneumothorax around 50%.  Patient had felt a pop few days back while picking something and subsequently felt short of breath.  In the ED, patient was tachypneic.  At baseline patient uses 2 to 3 L of oxygen at home.  Pulmonary critical care was consulted from the ED and patient underwent chest tube placement and was admitted to the hospital for further evaluation and treatment. See below for further problem-based plan.

## 2021-03-31 NOTE — Assessment & Plan Note (Addendum)
Spontaneous pneumothorax.  History of fibrotic lung disease increases risk as well.  History of right lung cancer stage IV.  PCCM following - chest tube removed on 2/15 - repeat CXR on 2/16 showed recurrent PNX ~70% however patient was asymptomatic and remained on 3L home O2 setting - re-evaluated by PCCM and chest tube reinserted on 2/16 -Patient was transitioned to a portable chest tube chamber by pulmonology prior to discharge; he will follow-up outpatient for further management

## 2021-03-31 NOTE — Assessment & Plan Note (Addendum)
On amiodarone, metoprolol, and Eliquis chronically  - amiodarone has been evaluated outpatient by pulmonology and cardiology; okay to continue

## 2021-03-31 NOTE — Assessment & Plan Note (Addendum)
-   Replete as needed 

## 2021-03-31 NOTE — Consult Note (Addendum)
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 Alverio is a 61 year old male with a past medical history significant for metastatic lung cancer, RA, A-fib, orthostasis, leukopenia, COPD, asthma, chronic oxygen use.  He was sent to the emergency department due to abnormal imaging results.  He was having a restaging CT scan of the chest/abdomen/pelvis when it was noted that he had a pneumothorax on CT.  The patient reports that he pick something up on Saturday and felt a "pop."  He has had some pleuritic chest pain since that time.  CT chest/abdomen/pelvis that showed a large right-sided pneumothorax with suboptimal evaluation of the right lung but with suspected treatment related fibrosis, no thoracic adenopathy, decrease in the right adrenal mets and no new or progressive abdominopelvic metastasis.  He was seen by pulmonology who placed a right chest tube.  He is currently receiving systemic treatment with Taxotere.  Last treatment was given on 03/13/2021. This morning, he is not having any significant pleuritic pain.  He is not having any shortness of breath at this time.  He currently denies headaches, fevers, chills, abdominal pain, nausea, vomiting, bleeding.  Medical oncology was asked to see the patient for recommendations regarding his metastatic lung cancer.  Past Medical History:  Diagnosis Date   Atrial fibrillation with RVR ([place]) 05/12/2020   Dyspnea    Family history of adverse reaction to anesthesia    brother with seizures had episode under anesthesia.  Patient has seizures as well.   GERD (gastroesophageal reflux disease)    History of radiation therapy 04/24/2020-05/16/2020   IMRT to right lung     Dr [name]   PAF (paroxysmal atrial fibrillation) ([place])    CHADS2VSAC score 0   Rheumatoid aortitis    Sepsis ([place]) 10/06/2020    Squamous cell lung cancer ([place])   :   Past Surgical History:  Procedure Laterality Date   BRONCHIAL BRUSHINGS  03/27/2020   Procedure: BRONCHIAL BRUSHINGS;  Surgeon: [name], MD;  Location: [place];  Service: Cardiopulmonary;;   BUBBLE STUDY  10/13/2020   Procedure: BUBBLE STUDY;  Surgeon: Pixie Casino, MD;  Location: [place];  Service: Cardiovascular;;   CARDIOVERSION N/A 10/13/2020   Procedure: CARDIOVERSION;  Surgeon: Pixie Casino, MD;  Location: [facility] ENDOSCOPY;  Service: Cardiovascular;  Laterality: N/A;   CATARACT EXTRACTION  2016   at [place]  03/27/2020   Procedure: FINE NEEDLE ASPIRATION;  Surgeon: [name], MD;  Location: [place];  Service: Cardiopulmonary;;   HIP SURGERY Left    TEE WITHOUT CARDIOVERSION N/A 10/13/2020   Procedure: TRANSESOPHAGEAL ECHOCARDIOGRAM (TEE);  Surgeon: Pixie Casino, MD;  Location: [place];  Service: Cardiovascular;  Laterality: N/A;   VIDEO BRONCHOSCOPY WITH ENDOBRONCHIAL ULTRASOUND N/A 03/27/2020   Procedure: VIDEO BRONCHOSCOPY WITH ENDOBRONCHIAL ULTRASOUND;  Surgeon: [name], MD;  Location: [place];  Service: Cardiopulmonary;  Laterality: N/A;  :   Current Facility-Administered Medications  Medication Dose Route Frequency Provider Last Rate Last Admin   acetaminophen (TYLENOL) tablet 650 mg  650 mg Oral Q6H PRN Marcelyn Bruins, MD   650 mg at 03/30/21 2113   Or   acetaminophen (TYLENOL) suppository 650 mg  650 mg Rectal Q6H PRN Marcelyn Bruins, MD       amiodarone (PACERONE) tablet 200 mg  200 mg Oral QHS  Marcelyn Bruins, MD   200 mg at 03/30/21 2127   apixaban (ELIQUIS) tablet 5 mg  5 mg Oral BID Marcelyn Bruins, MD   5 mg at 03/31/21 0930   levalbuterol (XOPENEX) nebulizer solution 0.63 mg  0.63 mg Nebulization Q8H PRN Marcelyn Bruins, MD       MEDLINE mouth rinse  15 mL Mouth Rinse BID Pokhrel, [place], MD   15 mL at 03/31/21 0931   metoprolol  succinate (TOPROL-XL) 24 hr tablet 25 mg  25 mg Oral Daily Marcelyn Bruins, MD   25 mg at 03/31/21 0929   pantoprazole (PROTONIX) EC tablet 40 mg  40 mg Oral QHS Marcelyn Bruins, MD   40 mg at 03/30/21 2127   PARoxetine (PAXIL) tablet 10 mg  10 mg Oral QHS Marcelyn Bruins, MD   10 mg at 03/30/21 2311   polyethylene glycol (MIRALAX / GLYCOLAX) packet 17 g  17 g Oral Daily PRN Marcelyn Bruins, MD       potassium chloride SA (KLOR-CON M) CR tablet 40 mEq  40 mEq Oral BID Candee Furbish, MD   40 mEq at 03/31/21 0930   predniSONE (DELTASONE) tablet 5 mg  5 mg Oral Daily Marcelyn Bruins, MD   5 mg at 03/31/21 0930   sodium chloride flush (NS) 0.9 % injection 10 mL  10 mL Other Q8H Marcelyn Bruins, MD   10 mL at 03/31/21 0239   sodium chloride flush (NS) 0.9 % injection 3 mL  3 mL Intravenous Q12H Marcelyn Bruins, MD   3 mL at 03/31/21 0931   umeclidinium-vilanterol (ANORO ELLIPTA) 62.5-25 MCG/ACT 1 puff  1 puff Inhalation Daily Marcelyn Bruins, MD          Allergies  Allergen Reactions   Albuterol Other (See Comments)    Patient has had episode of atrial fib following administration of albuterol (tolerates Xopenex well)  :   Family History  Problem Relation Age of Onset   Arrhythmia Mother        has PPM   Heart disease Father        Died at 12, started in his 23s, heart attacks, had PPM and ICD  :   Social History   Socioeconomic History   Marital status: Married    Spouse name: Not on file   Number of children: Not on file   Years of education: Not on file   Highest education level: Not on file  Occupational History   Not on file  Tobacco Use   Smoking status: Former    Packs/day: 1.00    Years: 30.00    Pack years: 30.00    Types: Cigarettes    Quit date: 2018    Years since quitting: 5.1   Smokeless tobacco: Never   Tobacco comments:    Former smoker 10/28/2020  Vaping Use   Vaping Use: Never used  Substance and Sexual Activity    Alcohol use: Not Currently   Drug use: Never   Sexual activity: Not on file  Other Topics Concern   Not on file  Social History Narrative   ** Merged History Encounter **       Social Determinants of Health   Financial Resource Strain: Not on file  Food Insecurity: Not on file  Transportation Needs: Not on file  Physical Activity: Not on file  Stress: Not on file  Social Connections: Not on file  Intimate Partner Violence: Not  on file  :  Review of Systems: A comprehensive 14 point review of systems was negative except as noted in the HPI.  Exam: Patient Vitals for the past 24 hrs:  BP Temp Temp src Pulse Resp SpO2 Height Weight  03/31/21 0536 119/84 (!) 97.5 F (36.4 C) Oral 84 16 100 % -- --  03/31/21 0117 120/83 97.9 F (36.6 C) Oral 81 18 98 % -- --  03/30/21 2101 (!) 129/94 98.3 F (36.8 C) Oral 90 20 94 % 5\' 11"  (1.803 m) 104.3 kg  03/30/21 2000 112/84 -- -- 83 (!) 24 100 % -- --  03/30/21 1945 117/84 -- -- 76 (!) 21 99 % -- --  03/30/21 1915 123/83 -- -- 81 (!) 25 98 % -- --  03/30/21 1900 121/85 -- -- 80 20 100 % -- --  03/30/21 1845 127/82 -- -- 85 20 99 % -- --  03/30/21 1830 123/75 -- -- 90 (!) 25 99 % -- --  03/30/21 1815 121/76 -- -- 87 (!) 27 99 % -- --  03/30/21 1800 139/90 -- -- 98 (!) 29 97 % -- --  03/30/21 1745 (!) 139/93 -- -- 97 (!) 22 100 % -- --  03/30/21 1730 (!) 124/96 -- -- 95 (!) 26 99 % -- --  03/30/21 1715 -- -- -- -- (!) 26 -- -- --  03/30/21 1702 134/78 98.5 F (36.9 C) Oral (!) 113 (!) 25 92 % -- --  03/30/21 1700 -- -- -- (!) 108 (!) 24 (!) 89 % -- --    General:  well-nourished in no acute distress.   Eyes:  no scleral icterus.   ENT:  There were no oropharyngeal lesions.   Respiratory: Diminished breath sounds on the right. Cardiovascular:  Regular rate and rhythm, S1/S2, without murmur, rub or gallop.  There was no pedal edema.   GI:  abdomen was soft, flat, nontender, nondistended, without organomegaly.    Skin exam was without  echymosis, petichae.   Neuro exam was nonfocal. Patient was alert and oriented.  Attention was good.   Language was appropriate.  Mood was normal without depression.  Speech was not pressured.  Thought content was not tangential.     Lab Results  Component Value Date   WBC 4.2 03/31/2021   HGB 10.4 (L) 03/31/2021   HCT 32.0 (L) 03/31/2021   PLT 198 03/31/2021   GLUCOSE 97 03/31/2021   CHOL 133 10/10/2020   TRIG 87 10/10/2020   HDL 22 (L) 10/10/2020   LDLCALC 94 10/10/2020   ALT 12 03/31/2021   AST 12 (L) 03/31/2021   NA 134 (L) 03/31/2021   K 3.3 (L) 03/31/2021   CL 99 03/31/2021   CREATININE 0.93 03/31/2021   BUN 12 03/31/2021   CO2 27 03/31/2021    DG Chest 1 View  Result Date: 03/31/2021 CLINICAL DATA:  Pneumothorax. EXAM: CHEST  1 VIEW COMPARISON:  March 30, 2021. FINDINGS: Stable cardiomediastinal silhouette. Stable right midlung and left basilar opacities are noted. Stable small pneumothorax is noted laterally in the right hemithorax. Right-sided chest tube is unchanged. Bony thorax is unremarkable. IMPRESSION: Stable position of right-sided chest tube with stable small right lateral pneumothorax. Stable bilateral lung opacities are noted. Electronically Signed   By: Marijo Conception M.D.   On: 03/31/2021 08:15   DG Chest 1 View  Result Date: 03/30/2021 CLINICAL DATA:  Status post chest tube insertion. EXAM: CHEST  1 VIEW COMPARISON:  Chest x-ray 03/30/2021 5:35  p.m. FINDINGS: The heart and mediastinal contours are unchanged. Aortic calcification. Low lung volume. Bilateral mid to lower lung zone interstitial and airspace opacities. No pulmonary edema. Interval placement of a right chest tube with pigtail overlying the right costophrenic angle. Interval decrease in size of a now small right pneumothorax. No pneumothorax. No acute osseous abnormality. IMPRESSION: 1. Interval placement of a right chest tube with pigtail overlying the right costophrenic angle. Interval decrease in  size of a now small right pneumothorax. 2. Bilateral mid to lower lung zone interstitial and airspace opacities. 3.  Aortic Atherosclerosis (ICD10-I70.0). Electronically Signed   By: Iven Finn M.D.   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 Evaluate treatment response. Shortness of breath EXAM: CT CHEST, ABDOMEN, AND PELVIS WITH CONTRAST TECHNIQUE: Multidetector CT imaging of the chest, abdomen and pelvis was performed following the standard protocol during bolus administration of intravenous contrast. RADIATION DOSE REDUCTION: This exam was performed according to the departmental dose-optimization program which includes automated exposure control, adjustment of the mA and/or kV according to patient size and/or use of iterative reconstruction technique. CONTRAST:  139mL ISOVUE-300 IOPAMIDOL (ISOVUE-300) INJECTION 61% COMPARISON:  12/31/2020 chest CT.  Abdominopelvic CT 07/18/2020 FINDINGS: CT CHEST FINDINGS Cardiovascular: Aortic atherosclerosis. Normal heart size with lipomatous hypertrophy of the interatrial septum. Lad and left circumflex coronary artery calcification. Mediastinum/Nodes: No supraclavicular adenopathy. No mediastinal or hilar adenopathy. Lungs/Pleura: No pleural fluid. Development of a large right-sided pneumothorax. The previously described pneumomediastinum has resolved. Centrilobular emphysema. The right lung is poorly evaluated secondary to collapse. Suspect underlying fibrosis, with architectural distortion and scattered ground-glass. Right upper lobe or superior segment lower lobe dominant bulla/blebs on 63/4. Underlying centrilobular emphysema. 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 Musculoskeletal: No acute osseous abnormality. CT ABDOMEN PELVIS FINDINGS Hepatobiliary: Normal liver. 1.8 cm  stone in the gallbladder neck. No acute cholecystitis or biliary duct dilatation. Pancreas: Pancreatic fatty replacement throughout the body and head. No duct dilatation or acute inflammation. Spleen: Minimal splenic hypoattenuation along the capsule on 62/2 likely represents sequelae of splenic infarct. Adrenals/Urinary Tract: Normal left adrenal gland. Bilobed right adrenal mass measures 4.9 x 3.1 cm on 58/2 versus 5.8 x 3.0 cm on the prior exam. Too small to characterize interpolar left renal lesion. Normal right kidney. No hydronephrosis. Normal urinary bladder. Stomach/Bowel: Normal stomach, without wall thickening. Normal colon, appendix, and terminal ileum. Normal small bowel. Vascular/Lymphatic: Aortic atherosclerosis. No abdominopelvic adenopathy. Reproductive: Normal prostate. Other: Small fat containing left inguinal hernia. No significant free fluid. No evidence of omental or peritoneal disease. Tiny fat containing ventral abdominal wall hernia. Musculoskeletal: L2 mild compression deformity or Schmorl's node, similar. IMPRESSION: CT CHEST IMPRESSION 1. Large right-sided pneumothorax. 2. Suboptimal evaluation of the right lung. Suspect treatment related fibrosis, as before. 3. No thoracic adenopathy. 4. Coronary artery atherosclerosis. Aortic Atherosclerosis (ICD10-I70.0). Emphysema (ICD10-J43.9). CT ABDOMEN AND PELVIS IMPRESSION 1. 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 2. No new or progressive abdominopelvic metastasis. 3. Cholelithiasis. 4. Sequelae of splenic infarct. A call to the emergency room provider at [facility] (where patient is currently) is pending as of 6:08 p.m. Electronically Signed   By: Abigail Miyamoto M.D.   On: 03/30/2021 18:08   DG Chest Port 1 View  Result Date: 03/30/2021 CLINICAL DATA:  Pneumothorax EXAM: PORTABLE CHEST 1 VIEW COMPARISON:  01/12/2021, CT 12/31/2020, chest x-ray 05/12/2020,  10/10/2020 FINDINGS: Large cavitary lesion in the right mid lung.  Underlying coarse interstitial opacity. Normal cardiac size with aortic atherosclerosis. Right-sided pneumothorax approaches 50%. Slight midline shift to the left. IMPRESSION: 1. Large right-sided pneumothorax approaches 50%. 2. There is underlying interstitial disease. Cavitary lesion in the right mid lung as noted on previous exam Critical Value/emergent results were called by telephone at the time of interpretation on 03/30/2021 at 5:56 pm to provider [facility] , who verbally acknowledged these results. Electronically Signed   By: Donavan Foil M.D.   On: 03/30/2021 17:56     DG Chest 1 View  Result Date: 03/31/2021 CLINICAL DATA:  Pneumothorax. EXAM: CHEST  1 VIEW COMPARISON:  March 30, 2021. FINDINGS: Stable cardiomediastinal silhouette. Stable right midlung and left basilar opacities are noted. Stable small pneumothorax is noted laterally in the right hemithorax. Right-sided chest tube is unchanged. Bony thorax is unremarkable. IMPRESSION: Stable position of right-sided chest tube with stable small right lateral pneumothorax. Stable bilateral lung opacities are noted. Electronically Signed   By: Marijo Conception M.D.   On: 03/31/2021 08:15   DG Chest 1 View  Result Date: 03/30/2021 CLINICAL DATA:  Status post chest tube insertion. EXAM: CHEST  1 VIEW COMPARISON:  Chest x-ray 03/30/2021 5:35 p.m. FINDINGS: The heart and mediastinal contours are unchanged. Aortic calcification. Low lung volume. Bilateral mid to lower lung zone interstitial and airspace opacities. No pulmonary edema. Interval placement of a right chest tube with pigtail overlying the right costophrenic angle. Interval decrease in size of a now small right pneumothorax. No pneumothorax. No acute osseous abnormality. IMPRESSION: 1. Interval placement of a right chest tube with pigtail overlying the right costophrenic angle. Interval decrease in size of a now small right pneumothorax. 2. Bilateral mid to lower lung zone interstitial and  airspace opacities. 3.  Aortic Atherosclerosis (ICD10-I70.0). Electronically Signed   By: Iven Finn M.D.   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 Evaluate treatment response. Shortness of breath EXAM: CT CHEST, ABDOMEN, AND PELVIS WITH CONTRAST TECHNIQUE: Multidetector CT imaging of the chest, abdomen and pelvis was performed following the standard protocol during bolus administration of intravenous contrast. RADIATION DOSE REDUCTION: This exam was performed according to the departmental dose-optimization program which includes automated exposure control, adjustment of the mA and/or kV according to patient size and/or use of iterative reconstruction technique. CONTRAST:  1110mL ISOVUE-300 IOPAMIDOL (ISOVUE-300) INJECTION 61% COMPARISON:  12/31/2020 chest CT.  Abdominopelvic CT 07/18/2020 FINDINGS: CT CHEST FINDINGS Cardiovascular: Aortic atherosclerosis. Normal heart size with lipomatous hypertrophy of the interatrial septum. Lad and left circumflex coronary artery calcification. Mediastinum/Nodes: No supraclavicular adenopathy. No mediastinal or hilar adenopathy. Lungs/Pleura: No pleural fluid. Development of a large right-sided pneumothorax. The previously described pneumomediastinum has resolved. Centrilobular emphysema. The right lung is poorly evaluated secondary to collapse. Suspect underlying fibrosis, with architectural distortion and scattered ground-glass. Right upper lobe or superior segment lower lobe dominant bulla/blebs on 63/4. Underlying centrilobular emphysema. 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 Musculoskeletal: No acute osseous abnormality. CT ABDOMEN PELVIS FINDINGS Hepatobiliary: Normal liver. 1.8 cm stone in the gallbladder neck. No acute cholecystitis or biliary duct dilatation. Pancreas:  Pancreatic fatty replacement throughout the body and head. No duct dilatation or acute inflammation. Spleen: Minimal splenic hypoattenuation along the capsule on 62/2 likely represents sequelae of splenic infarct. Adrenals/Urinary Tract: Normal left adrenal  gland. Bilobed right adrenal mass measures 4.9 x 3.1 cm on 58/2 versus 5.8 x 3.0 cm on the prior exam. Too small to characterize interpolar left renal lesion. Normal right kidney. No hydronephrosis. Normal urinary bladder. Stomach/Bowel: Normal stomach, without wall thickening. Normal colon, appendix, and terminal ileum. Normal small bowel. Vascular/Lymphatic: Aortic atherosclerosis. No abdominopelvic adenopathy. Reproductive: Normal prostate. Other: Small fat containing left inguinal hernia. No significant free fluid. No evidence of omental or peritoneal disease. Tiny fat containing ventral abdominal wall hernia. Musculoskeletal: L2 mild compression deformity or Schmorl's node, similar. IMPRESSION: CT CHEST IMPRESSION 1. Large right-sided pneumothorax. 2. Suboptimal evaluation of the right lung. Suspect treatment related fibrosis, as before. 3. No thoracic adenopathy. 4. Coronary artery atherosclerosis. Aortic Atherosclerosis (ICD10-I70.0). Emphysema (ICD10-J43.9). CT ABDOMEN AND PELVIS IMPRESSION 1. 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 2. No new or progressive abdominopelvic metastasis. 3. Cholelithiasis. 4. Sequelae of splenic infarct. A call to the emergency room provider at [facility] (where patient is currently) is pending as of 6:08 p.m. Electronically Signed   By: Abigail Miyamoto M.D.   On: 03/30/2021 18:08   DG Chest Port 1 View  Result Date: 03/30/2021 CLINICAL DATA:  Pneumothorax EXAM: PORTABLE CHEST 1 VIEW COMPARISON:  01/12/2021, CT 12/31/2020, chest x-ray 05/12/2020, 10/10/2020 FINDINGS: Large cavitary lesion in the right mid lung. Underlying coarse interstitial opacity. Normal cardiac size with aortic atherosclerosis.  Right-sided pneumothorax approaches 50%. Slight midline shift to the left. IMPRESSION: 1. Large right-sided pneumothorax approaches 50%. 2. There is underlying interstitial disease. Cavitary lesion in the right mid lung as noted on previous exam Critical Value/emergent results were called by telephone at the time of interpretation on 03/30/2021 at 5:56 pm to provider [facility] , who verbally acknowledged these results. Electronically Signed   By: Donavan Foil M.D.   On: 03/30/2021 17:56    Assessment and Plan:  1.  Metastatic squamous cell carcinoma of the lung 2.  Large right pneumothorax 3.  Chronic hypoxic respiratory failure 4.  RA 5.  Persistent atrial fibrillation 6.  Anemia  -Discussed restaging CT scan of the chest with the patient.  We discussed that imaging of the right lung was suboptimal but otherwise there is no evidence for disease progression.  He is due for chemotherapy again later this week but may need to delay given current hospitalization for pneumothorax -Appreciate assistance from pulmonology with management of chest tube. -Management of chronic medical conditions per hospitalist.  Mikey Bussing, DNP, AGPCNP-BC, AOCNP   ADDENDUM: I agree with the above assessment by Erasmo Downer.  It looks like there might be some disease response to his chemotherapy.  He is tolerated chemotherapy well.  I do not think that the Taxotere that had anything to do with the pneumothorax.  I know he has had radiation therapy.  I know he does have some interstitial lung issues.  Hopefully, this pneumothorax will resolve.  We will certainly follow along and help out any way that we can.  I know that he will get incredible care from all the staff up on [place], MD  1 Cor 13:13

## 2021-04-01 ENCOUNTER — Other Ambulatory Visit: Payer: Self-pay | Admitting: *Deleted

## 2021-04-01 ENCOUNTER — Inpatient Hospital Stay: Payer: 59

## 2021-04-01 ENCOUNTER — Inpatient Hospital Stay (HOSPITAL_COMMUNITY): Payer: 59

## 2021-04-01 ENCOUNTER — Telehealth: Payer: Self-pay | Admitting: Pulmonary Disease

## 2021-04-01 ENCOUNTER — Ambulatory Visit: Payer: 59 | Admitting: Emergency Medicine

## 2021-04-01 DIAGNOSIS — J9312 Secondary spontaneous pneumothorax: Secondary | ICD-10-CM

## 2021-04-01 DIAGNOSIS — J9621 Acute and chronic respiratory failure with hypoxia: Secondary | ICD-10-CM

## 2021-04-01 DIAGNOSIS — J9383 Other pneumothorax: Secondary | ICD-10-CM

## 2021-04-01 LAB — CBC
HCT: 29.5 % — ABNORMAL LOW (ref 39.0–52.0)
Hemoglobin: 9.7 g/dL — ABNORMAL LOW (ref 13.0–17.0)
MCH: 29.3 pg (ref 26.0–34.0)
MCHC: 32.9 g/dL (ref 30.0–36.0)
MCV: 89.1 fL (ref 80.0–100.0)
Platelets: 239 10*3/uL (ref 150–400)
RBC: 3.31 MIL/uL — ABNORMAL LOW (ref 4.22–5.81)
RDW: 16.8 % — ABNORMAL HIGH (ref 11.5–15.5)
WBC: 5.5 10*3/uL (ref 4.0–10.5)
nRBC: 0 % (ref 0.0–0.2)

## 2021-04-01 LAB — BASIC METABOLIC PANEL
Anion gap: 7 (ref 5–15)
BUN: 10 mg/dL (ref 6–20)
CO2: 27 mmol/L (ref 22–32)
Calcium: 8.4 mg/dL — ABNORMAL LOW (ref 8.9–10.3)
Chloride: 103 mmol/L (ref 98–111)
Creatinine, Ser: 1.12 mg/dL (ref 0.61–1.24)
GFR, Estimated: >60 mL/min (ref >60)
Glucose, Bld: 116 mg/dL — ABNORMAL HIGH (ref 70–99)
Potassium: 4 mmol/L (ref 3.5–5.1)
Sodium: 137 mmol/L (ref 135–145)

## 2021-04-01 LAB — MAGNESIUM: Magnesium: 2.2 mg/dL (ref 1.7–2.4)

## 2021-04-01 NOTE — Telephone Encounter (Signed)
PCCM:  Please arrange hospital follow-up for next week with two-view chest x-ray prior to appointment with APP or Dr. [name].  Garner Nash, DO [facility] 04/01/2021 10:19 AM

## 2021-04-01 NOTE — Progress Notes (Signed)
Progress Note   Patient: John [name] SAY:[id number] DOB: July 25, 1960 DOA: 03/30/2021     1 DOS: the patient was seen and examined on 04/01/2021   Brief hospital course: Bolton Canupp is a 61 y.o. male with past medical history significant for squamous cell carcinoma of the lung, rheumatoid arthritis, atrial fibrillation, COPD, chronic respiratory failure presented to the hospital with abnormal imaging and shortness of breath.  Patient had been to oncology for follow-up appointment he had a CT scan of the chest abdomen pelvis which demonstrated right-sided pneumothorax around 50%.  Patient had felt a pop few days back while picking something and subsequently felt short of breath.  In the ED, patient was tachypneic.  At baseline patient uses 2 to 3 L of oxygen at home.  Pulmonary critical care was consulted from the ED and patient underwent chest tube placement and was admitted to the hospital for further evaluation and treatment.  Assessment and Plan: * Pneumothorax- (present on admission) Spontaneous pneumothorax.  History of fibrotic lung disease increases risk as well.  History of right lung cancer stage IV.  PCCM following and patient is status post chest tube placement on admission - chest tube removed on 2/15 - will need outpatient CXR per PCCM, scheduled for 2/23 - possible d/c home on Thus or Fri if tolerates removal well   Acute on chronic respiratory failure with hypoxia ([place])- (present on admission) Patient uses 2 to 3 L of oxygen at baseline due to COPD and asthma.  Continue bronchodilators -Acute worsening suspected due to setting of pneumothorax - Continue weaning oxygen back to baseline as able  Persistent atrial fibrillation ([place])- (present on admission) On amiodarone, metoprolol, and Eliquis chronically  - amiodarone has been evaluated outpatient by pulmonology and cardiology; okay to continue   Stage 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 ([place])- (present on  admission) Follows up with [name] as outpatient.Currently on pembrolizumab and Taxotere as outpatient.  RA (rheumatoid arthritis) ([place])- (present on admission) On daily prednisone at home  Hypokalemia- (present on admission) - Replete as needed  Hyponatremia- (present on admission) Mild     Subjective: Seen this morning sitting in his recliner after chest tube had already been removed.  He was breathing comfortably with no significant pain at prior chest tube site.  He otherwise had no concerns and we discussed possible discharge home tomorrow or Friday.  Physical Exam: Vitals:   03/31/21 2040 04/01/21 0506 04/01/21 0900 04/01/21 1418  BP: 106/71 103/65  118/75  Pulse: 84 74  92  Resp: 20 20    Temp: 98.2 F (36.8 C) 97.7 F (36.5 C)  97.8 F (36.6 C)  TempSrc: Oral Oral  Oral  SpO2: 99% 98% 97% 97%  Weight:      Height:      Physical Exam Constitutional:      General: He is not in acute distress.    Appearance: Normal appearance.  HENT:     Head: Normocephalic and atraumatic.     Mouth/Throat:     Mouth: Mucous membranes are moist.  Eyes:     Extraocular Movements: Extraocular movements intact.  Cardiovascular:     Rate and Rhythm: Normal rate and regular rhythm.     Heart sounds: Normal heart sounds.  Pulmonary:     Effort: Pulmonary effort is normal.     Breath sounds: Normal breath sounds. No wheezing.  Abdominal:     General: Bowel sounds are normal. There is no distension.  Palpations: Abdomen is soft.     Tenderness: There is no abdominal tenderness.  Musculoskeletal:        General: Normal range of motion.     Cervical back: Normal range of motion and neck supple.  Skin:    General: Skin is warm and dry.  Neurological:     General: No focal deficit present.     Mental Status: He is alert.  Psychiatric:        Mood and Affect: Mood normal.        Behavior: Behavior normal.     Data Reviewed:  Results for orders placed or performed  during the hospital encounter of 03/30/21 (from the past 24 hour(s))  Basic metabolic panel     Status: Abnormal   Collection Time: 04/01/21  4:14 AM  Result Value Ref Range   Sodium 137 135 - 145 mmol/L   Potassium 4.0 3.5 - 5.1 mmol/L   Chloride 103 98 - 111 mmol/L   CO2 27 22 - 32 mmol/L   Glucose, Bld 116 (H) 70 - 99 mg/dL   BUN 10 6 - 20 mg/dL   Creatinine, Ser 1.12 0.61 - 1.24 mg/dL   Calcium 8.4 (L) 8.9 - 10.3 mg/dL   GFR, Estimated >60 >60 mL/min   Anion gap 7 5 - 15  CBC     Status: Abnormal   Collection Time: 04/01/21  4:14 AM  Result Value Ref Range   WBC 5.5 4.0 - 10.5 K/uL   RBC 3.31 (L) 4.22 - 5.81 MIL/uL   Hemoglobin 9.7 (L) 13.0 - 17.0 g/dL   HCT 29.5 (L) 39.0 - 52.0 %   MCV 89.1 80.0 - 100.0 fL   MCH 29.3 26.0 - 34.0 pg   MCHC 32.9 30.0 - 36.0 g/dL   RDW 16.8 (H) 11.5 - 15.5 %   Platelets 239 150 - 400 K/uL   nRBC 0.0 0.0 - 0.2 %  Magnesium     Status: None   Collection Time: 04/01/21  4:14 AM  Result Value Ref Range   Magnesium 2.2 1.7 - 2.4 mg/dL    I have Reviewed nursing notes, Vitals, and Lab results since pt's last encounter. Pertinent lab results : see above I have ordered test including BMP, CBC, Mg I have reviewed the last note from staff over past 24 hours I have discussed pt's care plan and test results with nursing staff, case manager   Family Communication:   Disposition: Status is: Inpatient Remains inpatient appropriate because: Treatment as outlined in A&P    Planned Discharge Destination: Home      Author: Dwyane Dee, MD 04/01/2021 6:34 PM  For on call review [email or web address].

## 2021-04-01 NOTE — Progress Notes (Signed)
Chest tube removed per order from [place] MD. Petroleum and gauze placed over site with Tegaderm in place. Patient educated to inform bedside nurse if increased shortness of breath or chest pain develops. Patient left with call bell in reach and no further requests.

## 2021-04-01 NOTE — Progress Notes (Signed)
Oncology Discharge Planning Admission Note  [facility] Address: [address], [place], [place] [address] Hours of Operation:  8am - 5pm, Monday - Friday  Clinic Contact Information:  [phone]) [phone]  Oncology Care Team: Medical Oncologist:  [name]  [name] is aware of this hospital admission dated 03/31/21 and has assessed patient at bedside. The cancer center will follow Tereasa Coop inpatient care to assist with discharge planning as indicated by the oncologist.  We will arrange for outpatient follow up closer to discharge.  Disclaimer:  This [place] note does not imply a formal consult request has been made by the admitting attending for this admission or there will be an inpatient consult completed by oncology.  Please request oncology consults as per standard process as indicated.

## 2021-04-01 NOTE — Progress Notes (Signed)
John [name] is feeling okay this morning.  Always had his chest x-ray.  Looks like the right lung is reexpanding.  Hopefully, will be able to have the chest tube removed.  He has had no problems with atrial fibrillation.  His cardiac monitor seems to be in sinus rhythm.  He has had no pain.  He has had no nausea or vomiting.  He has had no diarrhea.  His white cell count 5.5.  Hemoglobin 9.7.  Platelet count 239,000.  His BUN is 10 creatinine 1.12.  He has had no bleeding.  There is been no headache.  Again he had a CAT scan done before he was admitted.  Thankfully, it looks like he was responding.  I know is hard to tell what is going on with the right lung.  His vital signs show temperature 97.7.  Pulse 74.  Blood pressure 103/65.  His head neck exam shows no adenopathy.  There is no oral lesions.  Lungs he seems to have decent breath sounds over in the right lung field.  Left lung fields decent.  Cardiac exam regular rate and rhythm.  Abdomen is soft.  Bowel sounds are present.  There is no fluid wave.  There is no palpable liver or spleen tip.  Extremity shows no clubbing, cyanosis or edema.  Neurological exam is nonfocal.  Hopefully, this pneumothorax will be resolved.  Again, he looks pretty good.  I hear breath sounds over on the right side.  I am still not sure as to how the pneumothorax occurred.  I certainly have not seen this with patients who had radiation therapy.  I will have to ask our Radiation Oncologist about this.  I am just happy that he is doing well.  He actually looks quite good.  I am happy that the CAT scan was showing a response.  I do appreciate the incredible care that he is getting from the wonderful staff on [address].  [name], MD  Penelope Coop 5:25

## 2021-04-01 NOTE — Progress Notes (Signed)
04/01/2021  Seen today for evaluation of secondary pneumothorax.  Chest tube in place.  Stable on waterseal for greater than 24 hours.  Chest x-ray reviewed this morning also stable with no enlargement of the pneumo.    S:  Patient comfortable resting in bed  O: Blood pressure 103/65, pulse 74, temperature 97.7 F (36.5 C), temperature source Oral, resp. rate 20, height 5\' 11"  (1.803 m), weight 104.3 kg, SpO2 97 %.  No distress, resting in bed Clear to auscultation bilaterally no crackles no wheeze Pigtail in place on right chest On waterseal  A:  Right-sided secondary pneumothorax, history of fibrotic lung disease, non-small cell lung cancer radiation, radiation pneumonitis, immune checkpoint inhibitor pneumonitis  Plan: Chest tube on waterseal for the past 24 hours, chest x-ray stable Small apical pneumothorax remains however I think we need to pull his tube. Plan to pull chest tube today.  If patient develops shortness of breath or chest pain we will repeat x-ray. Once discharged will need two-view chest x-ray in pulmonary office prior to seeing Dr. [name] or APP in clinic. We will arrange hospital follow-up.  Garner Nash, DO [facility] 04/01/2021 10:17 AM

## 2021-04-01 NOTE — Telephone Encounter (Signed)
Patient scheduled to see Dr. [name] on 2/23.  CXR order placed.  Nothing further needed.

## 2021-04-02 ENCOUNTER — Inpatient Hospital Stay (HOSPITAL_COMMUNITY): Payer: 59

## 2021-04-02 DIAGNOSIS — J9312 Secondary spontaneous pneumothorax: Secondary | ICD-10-CM

## 2021-04-02 NOTE — Progress Notes (Signed)
Progress Note   Patient: John [name] OXB:[id number] DOB: Jul 11, 1960 DOA: 03/30/2021     2 DOS: the patient was seen and examined on 04/02/2021   Brief hospital course: John [name] is a 61 y.o. male with past medical history significant for squamous cell carcinoma of the lung, rheumatoid arthritis, atrial fibrillation, COPD, chronic respiratory failure presented to the hospital with abnormal imaging and shortness of breath.  Patient had been to oncology for follow-up appointment he had a CT scan of the chest abdomen pelvis which demonstrated right-sided pneumothorax around 50%.  Patient had felt a pop few days back while picking something and subsequently felt short of breath.  In the ED, patient was tachypneic.  At baseline patient uses 2 to 3 L of oxygen at home.  Pulmonary critical care was consulted from the ED and patient underwent chest tube placement and was admitted to the hospital for further evaluation and treatment.  Assessment and Plan: * Pneumothorax- (present on admission) Spontaneous pneumothorax.  History of fibrotic lung disease increases risk as well.  History of right lung cancer stage IV.  PCCM following and patient is status post chest tube placement on admission - chest tube removed on 2/15 - repeat CXR on 2/16 showed recurrent PNX ~70% however patient was asymptomatic and remained on 3L home O2 setting - re-evaluated by PCCM and chest tube reinserted  - monitor again overnight and further plans to be determined   Acute on chronic respiratory failure with hypoxia ([place])- (present on admission) Patient uses 2 to 3 L of oxygen at baseline due to COPD and asthma.  Continue bronchodilators -Acute worsening suspected due to setting of pneumothorax - Has been weaned back to baseline of 3 L  Persistent atrial fibrillation ([place])- (present on admission) On amiodarone, metoprolol, and Eliquis chronically  - amiodarone has been evaluated outpatient by pulmonology and cardiology;  okay to continue   Stage 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 ([name])- (present on admission) Follows up with [name] as outpatient.Currently on pembrolizumab and Taxotere as outpatient. -Treatment to be postponed pending recovery from pneumothorax  RA (rheumatoid arthritis) ([name])- (present on admission) On daily prednisone at home  Hypokalemia- (present on admission) - Replete as needed  Hyponatremia- (present on admission) Mild    Subjective: No events overnight and was asymptomatic and comfortable in bed this morning.  CXR showed recurrent pneumothorax again on the right side.  He underwent repeat chest tube placement with pulmonology today.  Physical Exam: Vitals:   04/01/21 2039 04/02/21 0542 04/02/21 0849 04/02/21 1248  BP: 115/75 131/86  99/73  Pulse: 77 90  80  Resp: 20 20  18   Temp: 97.8 F (36.6 C) 98.2 F (36.8 C)  98.1 F (36.7 C)  TempSrc: Oral Oral  Oral  SpO2: 94% 96% 96% 97%  Weight:      Height:      Physical Exam Constitutional:      General: He is not in acute distress.    Appearance: Normal appearance.  HENT:     Head: Normocephalic and atraumatic.     Mouth/Throat:     Mouth: Mucous membranes are moist.  Eyes:     Extraocular Movements: Extraocular movements intact.  Cardiovascular:     Rate and Rhythm: Normal rate and regular rhythm.     Heart sounds: Normal heart sounds.  Pulmonary:     Effort: Pulmonary effort is normal.     Breath sounds: Normal breath sounds. No wheezing.  Abdominal:  General: Bowel sounds are normal. There is no distension.     Palpations: Abdomen is soft.     Tenderness: There is no abdominal tenderness.  Musculoskeletal:        General: Normal range of motion.     Cervical back: Normal range of motion and neck supple.  Skin:    General: Skin is warm and dry.  Neurological:     General: No focal deficit present.     Mental Status: He is alert.  Psychiatric:        Mood and Affect: Mood normal.         Behavior: Behavior normal.     Data Reviewed:  No results found for this or any previous visit (from the past 24 hour(s)).   I have Reviewed nursing notes, Vitals, and Lab results since pt's last encounter. Pertinent lab results : see above I have ordered test including BMP, CBC, Mg I have reviewed the last note from staff over past 24 hours I have discussed pt's care plan and test results with nursing staff, case manager   Family Communication:   Disposition: Status is: Inpatient Remains inpatient appropriate because: Treatment as outlined in A&P   Planned Discharge Destination: Home   Consultants: Pulmonology   DVT ppx: apixaban (ELIQUIS) tablet 5 mg      Code Status: Full Code    Author: Dwyane Dee, MD 04/02/2021 3:30 PM  For on call review [email or web address].

## 2021-04-02 NOTE — Progress Notes (Signed)
Received call from nurse stating patient's heimlich valve now leaking from chest tube inserted today.  Ground team & eMD notified.

## 2021-04-02 NOTE — Procedures (Signed)
Insertion of Chest Tube Procedure Note  Jan Olano  [id number]  1960-08-02  Date:04/02/21  Time:11:53 AM    Provider Performing: Octavio Graves Zaylah Blecha   Procedure: Chest Tube Insertion (27078)  Indication(s) Pneumothorax  Consent Risks of the procedure as well as the alternatives and risks of each were explained to the patient and/or caregiver.  Consent for the procedure was obtained and is signed in the bedside chart  Anesthesia Topical only with 1% lidocaine   Time Out Verified patient identification, verified procedure, site/side was marked, verified correct patient position, special equipment/implants available, medications/allergies/relevant history reviewed, required imaging and test results available.  Sterile Technique Maximal sterile technique including full sterile barrier drape, hand hygiene, sterile gown, sterile gloves, mask, hair covering, sterile ultrasound probe cover (if used).   Procedure Description Ultrasound not used to identify appropriate pleural anatomy for placement and overlying skin marked. Area of placement cleaned and draped in sterile fashion.  A 14 French pigtail pleural catheter was placed into the right pleural space using Seldinger technique. Appropriate return of air was obtained.  The tube was connected to one way valve. Air was manually suctioned with syringe until stopped.    Complications/Tolerance None; patient tolerated the procedure well. Chest X-ray is ordered to verify placement.   EBL Minimal  Specimen(s) none  Garner Nash, DO [facility] 04/02/2021 11:53 AM

## 2021-04-02 NOTE — TOC Progression Note (Signed)
Transition of Care [facility]) - Progression Note    Patient Details  Name: John [name] MRN: [id number] Date of Birth: [birth year]  Transition of Care [facility]) CM/SW Contact  Purcell Mouton, RN Phone Number: 04/02/2021, 2:42 PM  Clinical Narrative:      Transition of Care (TOC) Screening Note   Patient Details  Name: John [name] Date of Birth: [birth year]   Transition of Care [facility]) CM/SW Contact:    Purcell Mouton, RN Phone Number: 04/02/2021, 2:43 PM    Transition of Care Department [facility]) has reviewed patient and no TOC needs have been identified at this time. We will continue to monitor patient advancement through interdisciplinary progression rounds. If new patient transition needs arise, please place a TOC consult.         Expected Discharge Plan and Services                                                 Social Determinants of Health (SDOH) Interventions    Readmission Risk Interventions No flowsheet data found.

## 2021-04-02 NOTE — Progress Notes (Signed)
PCCM:  CXR ordered  If ptx thorax stable Patient will be stable for d/c with followup in pulmonary office already scheduled.   Garner Nash, DO [place] [facility] 04/02/2021 8:26 AM

## 2021-04-02 NOTE — Progress Notes (Signed)
The chest tube was pulled yesterday.  He is feeling well.  There is no chest x-ray yet today.  He has had no problems with the atrial fibrillation.  Sounds like he is in sinus rhythm.  He is eating okay.  He is having no problems with nausea or vomiting.  He is having no fever.  There is no bleeding.  He is out of bed a little bit.  His vital signs show temperature of 98.2.  Pulse 90.  Blood pressure 131/86.  His lungs sound clear on the right side.  He has decent air movement on the right side.  Cardiac exam regular rate and rhythm.  Abdomen is soft.  Bowel sounds are present.  Extremity shows no clubbing, cyanosis or edema.  Hopefully, [name] might be able to go home today.  I probably would wait another week or so before we do any chemotherapy on him.  He really needs to recover from this episode of pneumothorax.  I know that he is gotten incredible care from all the staff up on [address].  I appreciate all of their compassion professionalism  [name], MD  Oswaldo Milian 57:19

## 2021-04-02 NOTE — Progress Notes (Signed)
CCMD called nurse on duty that patient has O2 saturation of 88%. Upon checking patient was off to pulse ox. Hooked it up and had 96% O2 saturation at 3 LPM O2 on Nasal Cannula.

## 2021-04-02 NOTE — Progress Notes (Signed)
04/02/2021  Follow-up on the floor for secondary pneumothorax.  He had greater than 24 hours of stability on waterseal so the decision was made for removal of the chest tube yesterday.  Unfortunately this morning he has had collapse again of the right lung.  It is important to note that he is asymptomatic with nearly half of his right lung down.  Additionally his O2 requirements have not changed.  S: Resting comfortably in bed on 3 L nasal cannula  O: BP 131/86 (BP Location: Left Arm)    Pulse 90    Temp 98.2 F (36.8 C) (Oral)    Resp 20    Ht 5\' 11"  (1.803 m)    Wt 104.3 kg    SpO2 96%    BMI 32.08 kg/m   General: Chronically ill-appearing gentleman resting comfortably in bed no distress Lungs: Near absent breath sounds on the right chest compared to the left Heart: Regular rhythm S1-S2 Abdomen: Soft nontender nondistended Extremities: No significant edema  Assessment: Secondary pneumothorax Likely has air leak related to cavitary malignancy In addition baseline fibrotic lung disease history of radiation pneumonitis and history of immune checkpoint hemodynamics  Plan: I would observe the patient with no chest tube at this point  He is asymptomatic even with a significant portion of his right lung down. I am not sure that were ever going to be able to keep it inflated. He is not a candidate for surgical intervention on the right thorax. I would only put a tube in his chest again if he develops significant symptoms related to the pneumothorax. His O2 sats are stable and his oxygen requirements are stable he has no chest pressure or pain. We will keep him here at least for another day to observe his pneumo.  I expect he will develop a scar down right chest with loculated hydropneumothorax over time.  An alternate would be consideration with placement of a mini express or Heimlich valve device but without suction capability I am not sure that his lung will stay inflated either.  I  will discuss case with Dr. [name] his primary pulmonologist    Garner Nash, DO [facility] 04/02/2021 10:11 AM

## 2021-04-02 NOTE — Progress Notes (Incomplete)
Assisted Icard MD with chest tube insertion- patient vitals stable throughout procedure- SpO2

## 2021-04-03 ENCOUNTER — Ambulatory Visit: Payer: 59 | Admitting: Cardiology

## 2021-04-03 ENCOUNTER — Inpatient Hospital Stay: Payer: 59

## 2021-04-03 ENCOUNTER — Inpatient Hospital Stay: Payer: 59 | Admitting: Hematology & Oncology

## 2021-04-03 DIAGNOSIS — J849 Interstitial pulmonary disease, unspecified: Secondary | ICD-10-CM

## 2021-04-03 LAB — CBC WITH DIFFERENTIAL/PLATELET
Abs Immature Granulocytes: 0.04 10*3/uL (ref 0.00–0.07)
Basophils Absolute: 0 10*3/uL (ref 0.0–0.1)
Basophils Relative: 1 %
Eosinophils Absolute: 0.1 10*3/uL (ref 0.0–0.5)
Eosinophils Relative: 2 %
HCT: 30.7 % — ABNORMAL LOW (ref 39.0–52.0)
Hemoglobin: 10 g/dL — ABNORMAL LOW (ref 13.0–17.0)
Immature Granulocytes: 1 %
Lymphocytes Relative: 16 %
Lymphs Abs: 0.8 10*3/uL (ref 0.7–4.0)
MCH: 28.9 pg (ref 26.0–34.0)
MCHC: 32.6 g/dL (ref 30.0–36.0)
MCV: 88.7 fL (ref 80.0–100.0)
Monocytes Absolute: 0.6 10*3/uL (ref 0.1–1.0)
Monocytes Relative: 11 %
Neutro Abs: 3.7 10*3/uL (ref 1.7–7.7)
Neutrophils Relative %: 69 %
Platelets: 276 10*3/uL (ref 150–400)
RBC: 3.46 MIL/uL — ABNORMAL LOW (ref 4.22–5.81)
RDW: 16.8 % — ABNORMAL HIGH (ref 11.5–15.5)
WBC: 5.2 10*3/uL (ref 4.0–10.5)
nRBC: 0 % (ref 0.0–0.2)

## 2021-04-03 LAB — BASIC METABOLIC PANEL
Anion gap: 6 (ref 5–15)
BUN: 13 mg/dL (ref 6–20)
CO2: 29 mmol/L (ref 22–32)
Calcium: 8.7 mg/dL — ABNORMAL LOW (ref 8.9–10.3)
Chloride: 101 mmol/L (ref 98–111)
Creatinine, Ser: 1.07 mg/dL (ref 0.61–1.24)
GFR, Estimated: >60 mL/min (ref >60)
Glucose, Bld: 105 mg/dL — ABNORMAL HIGH (ref 70–99)
Potassium: 3.9 mmol/L (ref 3.5–5.1)
Sodium: 136 mmol/L (ref 135–145)

## 2021-04-03 LAB — MAGNESIUM: Magnesium: 2.1 mg/dL (ref 1.7–2.4)

## 2021-04-03 NOTE — Progress Notes (Signed)
Discharge instructions provided to and reviewed with patient and patient's wife.  Patient and patient's wife instructed on monitoring chest tube insertion site and care.  Patient verbalized understanding.  Angie Fava, RN

## 2021-04-03 NOTE — Progress Notes (Signed)
° °  NAME:  John [name], MRN:  [id number], DOB:  1960/05/16, LOS: 3 ADMISSION DATE:  03/30/2021,   Interval events: -Pleurx catheter replaced on 2/16 with a heart valve in place. The Heimlich valve was treated out for a Pleur-evac due to leakage overnight last night -10 cc fluid in the Pleur-evac currently -His breathing did get better when the Pleur-evac was placed -Chest x-ray 2/16 reviewed, showed reinflation of his right lung after placement of Pleurx and with one-way valve.  Vitals:   04/02/21 1248 04/02/21 2200 04/03/21 0437 04/03/21 0914  BP: 99/73 118/73 107/75   Pulse: 80 67 68   Resp: 18 (!) 22 20   Temp: 98.1 F (36.7 C) 97.6 F (36.4 C) 97.8 F (36.6 C)   TempSrc: Oral Oral Oral   SpO2: 97% 96% 97% 97%  Weight:      Height:      Comfortable, sitting up in bed, no distress.  Lungs distant, few scattered rhonchi.  Good excursion and breath sounds.  Heart regular without a murmur.  Abdomen obese, nondistended with positive bowel sounds.  No significant edema.  Impression: -Secondary right pneumothorax in the setting of squamous cell lung cancer.  His right lung is reinflated after placement of chest tube, is not requiring suction. 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  He should be able to go home with this while we follow for hopeful resolution of his air leak. -We would need to ensure that we have all the appropriate equipment through case management: Tubing, alcohol swabs, Heimlich valve and/or mini-express devices -Would need follow-up soon in office with a chest x-ray.  Has been arranged for/23 with me   Baltazar Apo, MD, PhD 04/03/2021, 9:32 AM [facility] [phone] or if no answer  before 7:00PM call [phone] For any issues after 7:00PM please call eLink [phone]

## 2021-04-03 NOTE — Progress Notes (Signed)
Progress Note   Patient: John [name] YCX:[id number] DOB: 1961/01/17 DOA: 03/30/2021     3 DOS: the patient was seen and examined on 04/03/2021   Brief hospital course: John [name] is a 61 y.o. male with past medical history significant for squamous cell carcinoma of the lung, rheumatoid arthritis, atrial fibrillation, COPD, chronic respiratory failure presented to the hospital with abnormal imaging and shortness of breath.  Patient had been to oncology for follow-up appointment he had a CT scan of the chest abdomen pelvis which demonstrated right-sided pneumothorax around 50%.  Patient had felt a pop few days back while picking something and subsequently felt short of breath.  In the ED, patient was tachypneic.  At baseline patient uses 2 to 3 L of oxygen at home.  Pulmonary critical care was consulted from the ED and patient underwent chest tube placement and was admitted to the hospital for further evaluation and treatment.  Assessment and Plan: * Pneumothorax- (present on admission) Spontaneous pneumothorax.  History of fibrotic lung disease increases risk as well.  History of right lung cancer stage IV.  PCCM following - chest tube removed on 2/15 - repeat CXR on 2/16 showed recurrent PNX ~70% however patient was asymptomatic and remained on 3L home O2 setting - re-evaluated by PCCM and chest tube reinserted on 4/97 - heimlich valve removed and replaced by pleur-evac overnight; final discharge plan pending still; might be re-tried on heimlich valve vs mini-express   Acute on chronic respiratory failure with hypoxia (John [name])- (present on admission) Patient uses 2 to 3 L of oxygen at baseline due to COPD and asthma.  Continue bronchodilators -Acute worsening suspected due to setting of pneumothorax - Has been weaned back to baseline of 3 L  Persistent atrial fibrillation (John [name])- (present on admission) On amiodarone, metoprolol, and Eliquis chronically  - amiodarone has been evaluated  outpatient by pulmonology and cardiology; okay to continue   Stage 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 (John [name])- (present on admission) Follows up with Dr. Marin [name] as outpatient.Currently on pembrolizumab and Taxotere as outpatient. -Treatment to be postponed pending recovery from pneumothorax  RA (rheumatoid arthritis) (John [name])- (present on admission) On daily prednisone at home  Hypokalemia- (present on admission) - Replete as needed  Hyponatremia- (present on admission) Mild   Subjective: Family present bedside this morning.  Still feeling okay with no shortness of breath or chest pain.  Final plan regarding chest tube for discharge is still pending.  Physical Exam: Vitals:   04/02/21 2200 04/03/21 0437 04/03/21 0914 04/03/21 1418  BP: 118/73 107/75  120/75  Pulse: 67 68  77  Resp: (!) 22 20  16   Temp: 97.6 F (36.4 C) 97.8 F (36.6 C)  98.1 F (36.7 C)  TempSrc: Oral Oral  Oral  SpO2: 96% 97% 97% 97%  Weight:      Height:      Physical Exam Constitutional:      General: He is not in acute distress.    Appearance: Normal appearance.  HENT:     Head: Normocephalic and atraumatic.     Mouth/Throat:     Mouth: Mucous membranes are moist.  Eyes:     Extraocular Movements: Extraocular movements intact.  Cardiovascular:     Rate and Rhythm: Normal rate and regular rhythm.     Heart sounds: Normal heart sounds.  Pulmonary:     Effort: Pulmonary effort is normal.     Breath sounds: Normal breath sounds. No wheezing.  Abdominal:  General: Bowel sounds are normal. There is no distension.     Palpations: Abdomen is soft.     Tenderness: There is no abdominal tenderness.  Musculoskeletal:        General: Normal range of motion.     Cervical back: Normal range of motion and neck supple.  Skin:    General: Skin is warm and dry.  Neurological:     General: No focal deficit present.     Mental Status: He is alert.  Psychiatric:        Mood and Affect: Mood normal.         Behavior: Behavior normal.   Data Reviewed:  Results for orders placed or performed during the hospital encounter of 03/30/21 (from the past 24 hour(s))  Basic metabolic panel     Status: Abnormal   Collection Time: 04/03/21  3:41 AM  Result Value Ref Range   Sodium 136 135 - 145 mmol/L   Potassium 3.9 3.5 - 5.1 mmol/L   Chloride 101 98 - 111 mmol/L   CO2 29 22 - 32 mmol/L   Glucose, Bld 105 (H) 70 - 99 mg/dL   BUN 13 6 - 20 mg/dL   Creatinine, Ser 1.07 0.61 - 1.24 mg/dL   Calcium 8.7 (L) 8.9 - 10.3 mg/dL   GFR, Estimated >60 >60 mL/min   Anion gap 6 5 - 15  CBC with Differential/Platelet     Status: Abnormal   Collection Time: 04/03/21  3:41 AM  Result Value Ref Range   WBC 5.2 4.0 - 10.5 K/uL   RBC 3.46 (L) 4.22 - 5.81 MIL/uL   Hemoglobin 10.0 (L) 13.0 - 17.0 g/dL   HCT 30.7 (L) 39.0 - 52.0 %   MCV 88.7 80.0 - 100.0 fL   MCH 28.9 26.0 - 34.0 pg   MCHC 32.6 30.0 - 36.0 g/dL   RDW 16.8 (H) 11.5 - 15.5 %   Platelets 276 150 - 400 K/uL   nRBC 0.0 0.0 - 0.2 %   Neutrophils Relative % 69 %   Neutro Abs 3.7 1.7 - 7.7 K/uL   Lymphocytes Relative 16 %   Lymphs Abs 0.8 0.7 - 4.0 K/uL   Monocytes Relative 11 %   Monocytes Absolute 0.6 0.1 - 1.0 K/uL   Eosinophils Relative 2 %   Eosinophils Absolute 0.1 0.0 - 0.5 K/uL   Basophils Relative 1 %   Basophils Absolute 0.0 0.0 - 0.1 K/uL   Immature Granulocytes 1 %   Abs Immature Granulocytes 0.04 0.00 - 0.07 K/uL  Magnesium     Status: None   Collection Time: 04/03/21  3:41 AM  Result Value Ref Range   Magnesium 2.1 1.7 - 2.4 mg/dL     I have Reviewed nursing notes, Vitals, and Lab results since pt's last encounter. Pertinent lab results : see above I have ordered test including BMP, CBC, Mg I have reviewed the last note from staff over past 24 hours I have discussed pt's care plan and test results with nursing staff, case manager   Family Communication:   Disposition: Status is: Inpatient Remains inpatient  appropriate because: Treatment as outlined in A&P   Planned Discharge Destination: Home   Consultants: Pulmonology   DVT ppx: apixaban (ELIQUIS) tablet 5 mg      Code Status: Full Code    Author: Dwyane Dee, MD 04/03/2021 4:05 PM  For on call review [email or web address].

## 2021-04-04 NOTE — Discharge Summary (Signed)
Physician Discharge Summary   Patient: John [name] MRN: [id number] DOB: 02-16-1960  Admit date:     03/30/2021  Discharge date: 04/03/2021  Discharge Physician: Dwyane Dee   PCP: [name], MD   Recommendations at discharge:    Follow up with pulmonology  Discharge Diagnoses: Principal Problem:   Pneumothorax Active Problems:   Stage 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 (HCC)   Persistent atrial fibrillation (HCC)   Hyponatremia   Hypokalemia   RA (rheumatoid arthritis) ([place])  Resolved Problems:   Acute on chronic respiratory failure with hypoxia [facility])   Hospital Course: John [name] is a 61 y.o. male with past medical history significant for squamous cell carcinoma of the lung, rheumatoid arthritis, atrial fibrillation, COPD, chronic respiratory failure presented to the hospital with abnormal imaging and shortness of breath.  Patient had been to oncology for follow-up appointment he had a CT scan of the chest abdomen pelvis which demonstrated right-sided pneumothorax around 50%.  Patient had felt a pop few days back while picking something and subsequently felt short of breath.  In the ED, patient was tachypneic.  At baseline patient uses 2 to 3 L of oxygen at home.  Pulmonary critical care was consulted from the ED and patient underwent chest tube placement and was admitted to the hospital for further evaluation and treatment. See below for further problem-based plan.  Assessment and Plan: * Pneumothorax- (present on admission) Spontaneous pneumothorax.  History of fibrotic lung disease increases risk as well.  History of right lung cancer stage IV.  PCCM following - chest tube removed on 2/15 - repeat CXR on 2/16 showed recurrent PNX ~70% however patient was asymptomatic and remained on 3L home O2 setting - re-evaluated by PCCM and chest tube reinserted on 2/16 -Patient was transitioned to a portable chest tube chamber by pulmonology prior to discharge; he will  follow-up outpatient for further management  Acute on chronic respiratory failure with hypoxia (HCC)-resolved as of 04/04/2021, (present on admission) Patient uses 2 to 3 L of oxygen at baseline due to COPD and asthma.  Continue bronchodilators -Acute worsening suspected due to setting of pneumothorax - Has been weaned back to baseline of 3 L  Persistent atrial fibrillation ([place])- (present on admission) On amiodarone, metoprolol, and Eliquis chronically  - amiodarone has been evaluated outpatient by pulmonology and cardiology; okay to continue   Stage 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 ([place])- (present on admission) Follows up with [name] as outpatient.Currently on pembrolizumab and Taxotere as outpatient. -Treatment to be postponed pending recovery from pneumothorax  RA (rheumatoid arthritis) ([place])- (present on admission) On daily prednisone at home  Hypokalemia- (present on admission) - Replete as needed  Hyponatremia- (present on admission) Mild          Consultants: Pulmonology  Disposition: Home Diet recommendation:  Discharge Diet Orders (From admission, onward)     Start     Ordered   04/03/21 0000  Diet - low sodium heart healthy        04/03/21 1909           Cardiac diet  DISCHARGE MEDICATION: Allergies as of 04/03/2021       Reactions   Albuterol Other (See Comments)   Patient has had episode of atrial fib following administration of albuterol (tolerates Xopenex well)        Medication List     STOP taking these medications    levalbuterol 45 MCG/ACT inhaler Commonly known as: Xopenex HFA  TAKE these medications    amiodarone 200 MG tablet Commonly known as: PACERONE Take 200 mg by mouth at bedtime. What changed: Another medication with the same name was removed. Continue taking this medication, and follow the directions you see here.   apixaban 5 MG Tabs tablet Commonly known as: Eliquis Take 1 tablet (5 mg total)  by mouth 2 (two) times daily.   atorvastatin 10 MG tablet Commonly known as: LIPITOR Take 1 tablet (10 mg total) by mouth daily.   dexamethasone 4 MG tablet Commonly known as: DECADRON Take 2 tablets (8 mg total) by mouth 2 (two) times daily. Start the day before Taxotere. Then daily after chemo for 2 days.   levalbuterol 0.63 MG/3ML nebulizer solution Commonly known as: XOPENEX Take 3 mLs (0.63 mg total) by nebulization every 8 (eight) hours as needed for wheezing or shortness of breath.   LORazepam 0.5 MG tablet Commonly known as: Ativan Take 1 tablet (0.5 mg total) by mouth every 6 (six) hours as needed (Nausea or vomiting).   metoprolol succinate 25 MG 24 hr tablet Commonly known as: Toprol XL Take one tablet by mouth daily   ondansetron 8 MG tablet Commonly known as: Zofran Take 1 tablet (8 mg total) by mouth 2 (two) times daily as needed for refractory nausea / vomiting.   PARoxetine 10 MG tablet Commonly known as: PAXIL Take 10 mg by mouth at bedtime.   predniSONE 5 MG tablet Commonly known as: DELTASONE Take 1 tablet (5 mg total) by mouth daily.   PriLOSEC OTC 20 MG tablet Generic drug: omeprazole Take 20 mg by mouth at bedtime.   prochlorperazine 10 MG tablet Commonly known as: COMPAZINE Take 1 tablet (10 mg total) by mouth every 6 (six) hours as needed (Nausea or vomiting).   umeclidinium-vilanterol 62.5-25 MCG/ACT Aepb Commonly known as: ANORO ELLIPTA Inhale 1 puff into the lungs daily.         Discharge Exam: Filed Weights   03/30/21 2101  Weight: 104.3 kg   Physical Exam Constitutional:      General: He is not in acute distress.    Appearance: Normal appearance.  HENT:     Head: Normocephalic and atraumatic.     Mouth/Throat:     Mouth: Mucous membranes are moist.  Eyes:     Extraocular Movements: Extraocular movements intact.  Cardiovascular:     Rate and Rhythm: Normal rate and regular rhythm.     Heart sounds: Normal heart sounds.   Pulmonary:     Effort: Pulmonary effort is normal.     Breath sounds: Normal breath sounds. No wheezing.  Chest:     Comments: Chest tube in place in right upper chest Abdominal:     General: Bowel sounds are normal. There is no distension.     Palpations: Abdomen is soft.     Tenderness: There is no abdominal tenderness.  Musculoskeletal:        General: Normal range of motion.     Cervical back: Normal range of motion and neck supple.  Skin:    General: Skin is warm and dry.  Neurological:     General: No focal deficit present.     Mental Status: He is alert.  Psychiatric:        Mood and Affect: Mood normal.        Behavior: Behavior normal.    Condition at discharge: stable  The results of significant diagnostics from this hospitalization (including imaging, microbiology, ancillary and laboratory) are  listed below for reference.   Imaging Studies: DG Chest 1 View  Result Date: 04/01/2021 CLINICAL DATA:  Pneumothorax EXAM: CHEST  1 VIEW COMPARISON:  Radiograph 03/31/2021 FINDINGS: Unchanged cardiomediastinal silhouette. Unchanged right perihilar opacity with cystic lucencies in the right midlung. There are unchanged interstitial and lower lung airspace opacities in the right lung. There are unchanged peripheral predominant interstitial opacities in the left lung with some more centrally faint opacity. Unchanged position of right basilar chest tube. Unchanged small residual right lateral pneumothorax. Bones are unchanged. IMPRESSION: Unchanged small residual right lateral pneumothorax with basilar chest tube in place. Unchanged parenchymal opacities bilaterally. Electronically Signed   By: Maurine Simmering M.D.   On: 04/01/2021 08:06   DG Chest 1 View  Result Date: 03/31/2021 CLINICAL DATA:  Pneumothorax. EXAM: CHEST  1 VIEW COMPARISON:  March 30, 2021. FINDINGS: Stable cardiomediastinal silhouette. Stable right midlung and left basilar opacities are noted. Stable small  pneumothorax is noted laterally in the right hemithorax. Right-sided chest tube is unchanged. Bony thorax is unremarkable. IMPRESSION: Stable position of right-sided chest tube with stable small right lateral pneumothorax. Stable bilateral lung opacities are noted. Electronically Signed   By: Marijo Conception M.D.   On: 03/31/2021 08:15   DG Chest 1 View  Result Date: 03/30/2021 CLINICAL DATA:  Status post chest tube insertion. EXAM: CHEST  1 VIEW COMPARISON:  Chest x-ray 03/30/2021 5:35 p.m. FINDINGS: The heart and mediastinal contours are unchanged. Aortic calcification. Low lung volume. Bilateral mid to lower lung zone interstitial and airspace opacities. No pulmonary edema. Interval placement of a right chest tube with pigtail overlying the right costophrenic angle. Interval decrease in size of a now small right pneumothorax. No pneumothorax. No acute osseous abnormality. IMPRESSION: 1. Interval placement of a right chest tube with pigtail overlying the right costophrenic angle. Interval decrease in size of a now small right pneumothorax. 2. Bilateral mid to lower lung zone interstitial and airspace opacities. 3.  Aortic Atherosclerosis (ICD10-I70.0). Electronically Signed   By: Iven Finn M.D.   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 Evaluate treatment response. Shortness of breath EXAM: CT CHEST, ABDOMEN, AND PELVIS WITH CONTRAST TECHNIQUE: Multidetector CT imaging of the chest, abdomen and pelvis was performed following the standard protocol during bolus administration of intravenous contrast. RADIATION DOSE REDUCTION: This exam was performed according to the departmental dose-optimization program which includes automated exposure control, adjustment of the mA and/or kV according to patient size and/or use of iterative reconstruction technique. CONTRAST:  128mL ISOVUE-300 IOPAMIDOL  (ISOVUE-300) INJECTION 61% COMPARISON:  12/31/2020 chest CT.  Abdominopelvic CT 07/18/2020 FINDINGS: CT CHEST FINDINGS Cardiovascular: Aortic atherosclerosis. Normal heart size with lipomatous hypertrophy of the interatrial septum. Lad and left circumflex coronary artery calcification. Mediastinum/Nodes: No supraclavicular adenopathy. No mediastinal or hilar adenopathy. Lungs/Pleura: No pleural fluid. Development of a large right-sided pneumothorax. The previously described pneumomediastinum has resolved. Centrilobular emphysema. The right lung is poorly evaluated secondary to collapse. Suspect underlying fibrosis, with architectural distortion and scattered ground-glass. Right upper lobe or superior segment lower lobe dominant bulla/blebs on 63/4. Underlying centrilobular emphysema. 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 Musculoskeletal: No acute osseous abnormality. CT ABDOMEN PELVIS FINDINGS Hepatobiliary: Normal liver. 1.8 cm stone in the gallbladder neck. No acute cholecystitis or biliary duct dilatation. Pancreas: Pancreatic fatty replacement throughout the body and head. No duct dilatation or  acute inflammation. Spleen: Minimal splenic hypoattenuation along the capsule on 62/2 likely represents sequelae of splenic infarct. Adrenals/Urinary Tract: Normal left adrenal gland. Bilobed right adrenal mass measures 4.9 x 3.1 cm on 58/2 versus 5.8 x 3.0 cm on the prior exam. Too small to characterize interpolar left renal lesion. Normal right kidney. No hydronephrosis. Normal urinary bladder. Stomach/Bowel: Normal stomach, without wall thickening. Normal colon, appendix, and terminal ileum. Normal small bowel. Vascular/Lymphatic: Aortic atherosclerosis. No abdominopelvic adenopathy. Reproductive: Normal prostate. Other: Small fat containing left inguinal hernia. No significant free fluid. No evidence of omental or peritoneal disease. Tiny  fat containing ventral abdominal wall hernia. Musculoskeletal: L2 mild compression deformity or Schmorl's node, similar. IMPRESSION: CT CHEST IMPRESSION 1. Large right-sided pneumothorax. 2. Suboptimal evaluation of the right lung. Suspect treatment related fibrosis, as before. 3. No thoracic adenopathy. 4. Coronary artery atherosclerosis. Aortic Atherosclerosis (ICD10-I70.0). Emphysema (ICD10-J43.9). CT ABDOMEN AND PELVIS IMPRESSION 1. 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 2. No new or progressive abdominopelvic metastasis. 3. Cholelithiasis. 4. Sequelae of splenic infarct. A call to the emergency room provider at [facility] (where patient is currently) is pending as of 6:08 p.m. Electronically Signed   By: Abigail Miyamoto M.D.   On: 03/30/2021 18:08   DG CHEST PORT 1 VIEW  Result Date: 04/02/2021 CLINICAL DATA:  Status post chest tube placement. EXAM: PORTABLE CHEST 1 VIEW COMPARISON:  04/02/21 at 9:02 a.m. FINDINGS: There is been interval placement a right-sided chest tube with significant reduction in volume of right-sided pneumothorax. The right pneumothorax measures approximately 4 mm measured over the lateral right lung. This is compared with 3.9 cm earlier today. Similar appearance of the right mid lung cavitary mass. There is diffuse peripheral and basilar predominant reticular interstitial opacities within the left lung, unchanged. IMPRESSION: 1. Interval placement of right-sided chest tube with significant reduction in volume of right-sided pneumothorax. 2. No change in right mid lung cavitary mass. Electronically Signed   By: Kerby Moors M.D.   On: 04/02/2021 12:26   DG CHEST PORT 1 VIEW  Result Date: 04/02/2021 CLINICAL DATA:  Shortness of breath, pneumothorax EXAM: PORTABLE CHEST 1 VIEW COMPARISON:  04/01/2021 FINDINGS: Cardiomegaly. Interval removal of a previously seen right-sided pigtail chest tube. Large right pneumothorax, approximately 70% volume, increased compared  to prior examination. Cavitary lesion of the right midlung again noted. Diffuse interstitial opacity of the left lung. The visualized skeletal structures are unremarkable. IMPRESSION: 1. Large right pneumothorax, approximately 70% volume, increased compared to prior examination following removal of chest tube. 2. Cavitary lesion of the right midlung again noted. 3. Diffuse interstitial opacity of the left lung, likely edema in the setting of cardiomegaly. 4. Cardiomegaly. These results will be called to the ordering clinician or representative by the Radiologist Assistant, and communication documented in the PACS or [employer]. Electronically Signed   By: Delanna Ahmadi M.D.   On: 04/02/2021 09:17   DG Chest Port 1 View  Result Date: 03/30/2021 CLINICAL DATA:  Pneumothorax EXAM: PORTABLE CHEST 1 VIEW COMPARISON:  01/12/2021, CT 12/31/2020, chest x-ray 05/12/2020, 10/10/2020 FINDINGS: Large cavitary lesion in the right mid lung. Underlying coarse interstitial opacity. Normal cardiac size with aortic atherosclerosis. Right-sided pneumothorax approaches 50%. Slight midline shift to the left. IMPRESSION: 1. Large right-sided pneumothorax approaches 50%. 2. There is underlying interstitial disease. Cavitary lesion in the right mid lung as noted on previous exam Critical Value/emergent results were called by telephone at the time of interpretation on 03/30/2021 at 5:56 pm to provider  KELSEY FORD , who verbally acknowledged these results. Electronically Signed   By: Donavan Foil M.D.   On: 03/30/2021 17:56    Microbiology: Results for orders placed or performed during the hospital encounter of 03/30/21  Resp Panel by RT-PCR (Flu A&B, Covid) Nasopharyngeal Swab     Status: None   Collection Time: 03/30/21  5:29 PM   Specimen: Nasopharyngeal Swab; Nasopharyngeal(NP) swabs in vial transport medium  Result Value Ref Range Status   SARS Coronavirus 2 by RT PCR NEGATIVE NEGATIVE Final    Comment:  (NOTE) SARS-CoV-2 target nucleic acids are NOT DETECTED.  The SARS-CoV-2 RNA is generally detectable in upper respiratory specimens during the acute phase of infection. The lowest concentration of SARS-CoV-2 viral copies this assay can detect is 138 copies/mL. A negative result does not preclude SARS-Cov-2 infection and should not be used as the sole basis for treatment or other patient management decisions. A negative result may occur with  improper specimen collection/handling, submission of specimen other than nasopharyngeal swab, presence of viral mutation(s) within the areas targeted by this assay, and inadequate number of viral copies(<138 copies/mL). A negative result must be combined with clinical observations, patient history, and epidemiological information. The expected result is Negative.  Fact Sheet for Patients:  EntrepreneurPulse.com.au  Fact Sheet for Healthcare Providers:  IncredibleEmployment.be  This test is no t yet approved or cleared by the [place] FDA and  has been authorized for detection and/or diagnosis of SARS-CoV-2 by FDA under an Emergency Use Authorization (EUA). This EUA will remain  in effect (meaning this test can be used) for the duration of the COVID-19 declaration under Section 564(b)(1) of the Act, 21 U.S.C.section 360bbb-3(b)(1), unless the authorization is terminated  or revoked sooner.       Influenza A by PCR NEGATIVE NEGATIVE Final   Influenza B by PCR NEGATIVE NEGATIVE Final    Comment: (NOTE) The Xpert Xpress SARS-CoV-2/FLU/RSV plus assay is intended as an aid in the diagnosis of influenza from Nasopharyngeal swab specimens and should not be used as a sole basis for treatment. Nasal washings and aspirates are unacceptable for Xpert Xpress SARS-CoV-2/FLU/RSV testing.  Fact Sheet for Patients: EntrepreneurPulse.com.au  Fact Sheet for Healthcare  Providers: IncredibleEmployment.be  This test is not yet approved or cleared by the [place] FDA and has been authorized for detection and/or diagnosis of SARS-CoV-2 by FDA under an Emergency Use Authorization (EUA). This EUA will remain in effect (meaning this test can be used) for the duration of the COVID-19 declaration under Section 564(b)(1) of the Act, 21 U.S.C. section 360bbb-3(b)(1), unless the authorization is terminated or revoked.  Performed at [facility], [place] [address], [place], [place] [address]     Labs: CBC: Recent Labs  Lab 03/30/21 1648 03/31/21 0408 04/01/21 0414 04/03/21 0341  WBC 6.1 4.2 5.5 5.2  NEUTROABS 4.6  --   --  3.7  HGB 10.9* 10.4* 9.7* 10.0*  HCT 32.9* 32.0* 29.5* 30.7*  MCV 87.7 87.7 89.1 88.7  PLT 245 198 239 638   Basic Metabolic Panel: Recent Labs  Lab 03/30/21 1648 03/31/21 0408 04/01/21 0414 04/03/21 0341  NA 133* 134* 137 136  K 4.0 3.3* 4.0 3.9  CL 99 99 103 101  CO2 25 27 27 29   GLUCOSE 109* 97 116* 105*  BUN 15 12 10 13   CREATININE 1.09 0.93 1.12 1.07  CALCIUM 8.7* 8.5* 8.4* 8.7*  MG  --   --  2.2 2.1   Liver  Function Tests: Recent Labs  Lab 03/31/21 0408  AST 12*  ALT 12  ALKPHOS 80  BILITOT 0.6  PROT 6.2*  ALBUMIN 3.1*   CBG: No results for input(s): GLUCAP in the last 168 hours.  Discharge time spent: greater than 30 minutes.  Signed: Dwyane Dee, MD [facility] 04/04/2021

## 2021-04-07 ENCOUNTER — Encounter (HOSPITAL_COMMUNITY): Payer: Self-pay | Admitting: *Deleted

## 2021-04-07 NOTE — Progress Notes (Signed)
[name] was contacted by telephone to verify understanding of discharge instructions status post their most recent discharge from the hospital on the date:  03/03/21.  Patient was unavailable and a detailed message was left on personal VM.   Transportation to appointments were confirmed for the patient as being self/caregiver.  John [name] questions will addressed to their satisfaction upon completion of this post discharge follow-up call for outpatient oncology.

## 2021-04-09 ENCOUNTER — Ambulatory Visit: Payer: 59 | Admitting: Emergency Medicine

## 2021-04-09 ENCOUNTER — Other Ambulatory Visit: Payer: Self-pay

## 2021-04-09 ENCOUNTER — Encounter: Payer: Self-pay | Admitting: Emergency Medicine

## 2021-04-09 DIAGNOSIS — C3491 Malignant neoplasm of unspecified part of right bronchus or lung: Secondary | ICD-10-CM

## 2021-04-09 DIAGNOSIS — J45909 Unspecified asthma, uncomplicated: Secondary | ICD-10-CM

## 2021-04-09 DIAGNOSIS — J9312 Secondary spontaneous pneumothorax: Secondary | ICD-10-CM | POA: Diagnosis not present

## 2021-04-09 DIAGNOSIS — J939 Pneumothorax, unspecified: Secondary | ICD-10-CM

## 2021-04-09 DIAGNOSIS — J45901 Unspecified asthma with (acute) exacerbation: Secondary | ICD-10-CM

## 2021-04-09 DIAGNOSIS — J449 Chronic obstructive pulmonary disease, unspecified: Secondary | ICD-10-CM | POA: Diagnosis not present

## 2021-04-09 NOTE — Patient Instructions (Addendum)
We will plan to keep your chest tube on waterseal with the ability to drain for now. Follow-up in our office next week.  If you continue to do well and if no evidence of leaking air then we will plan to clamp the chest tube for 2 to 3 days and then repeat your chest x-ray.  If your lung remains well-inflated with the chest tube clamped then we can plan to remove it. Continue medications the way you are taking them.

## 2021-04-09 NOTE — Assessment & Plan Note (Signed)
Right pneumothorax.  Chest tube in place, currently on waterseal.  He drained about 350 cc of gold fluid in the last 6 and half days.  We aspirated this today and now the Pleur-evac chamber is empty.  Plan to leave him to waterseal through the weekend.  Have him come in next week to be evaluated.  If doing well and if no air leak then would plan to clamp the chest tube and see how he tolerates.  Then repeat his chest x-ray in 2-3 days.  If lung remains well-inflated then should be able to remove his chest tube.

## 2021-04-09 NOTE — Assessment & Plan Note (Signed)
Anoro and albuterol.

## 2021-04-09 NOTE — Assessment & Plan Note (Signed)
Should probably defer his chemotherapy until the chest tube is out given risk for immunosuppression and infection.

## 2021-04-09 NOTE — Progress Notes (Signed)
Subjective:    Patient ID: John [name], male    DOB: 11/26/1960, 61 y.o.   MRN: [id number]  HPI  ROV 08/28/20 --John [name] is 61 with a history of rheumatoid arthritis, seizures.  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  He has mild obstruction by pulmonary function testing.  I saw him in April and we decided to use Xopenex as needed for dyspnea to see how he tolerates.  I held off on scheduled bronchodilator therapy because he had a recent hospitalization with sepsis, associated atrial fibrillation with RVR. Today he reports that he does still have some intermittent cough. His breathing is better but can still limit him. He is off amiodarone now.  He has never really tried the xopenex yet.  He is currently on [place], planning for a repeat CT chest in the next few months. Tolerating although he has some GGI   CT chest 07/18/20 reviewed by me shows stable 1.1 cm right upper lobe nodule, decrease in size of his posterior cavitary right upper lobe mass, now 5.0 x 3.2 cm, new region of right lower lobe consolidative change, groundglass.   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  He was admitted in November 2022 for acute hypoxemic respiratory failure with bilateral heterogeneous infiltrates concerning for acute pneumonitis while on Keytruda.  He required a prolonged prednisone taper.  He was seen in our office 01/12/2021 and was clinically improved, on 2 L/min.  He was also started on Anoro post hospitalization.  He has Xopenex to use if needed.  Today he reports that he is feeling OK. He has stable SOB, happens with exertion. He doesn't always use his O2 w exertion. He is on Anoro. He is using xopenex nebs bid.   ROV / Hosp F/u 04/09/21 --John [name] is 61 with a history of stage IVa squamous cell  lung cancer involving the right upper lobe.  He also has associated mild obstructive lung disease.  He has been managed on Keytruda but had associated pneumonitis that required a prolonged prednisone taper.  He has chronic hypoxemic respiratory failure and is on 2 L/min.  He was unfortunately admitted 03/30/2021 with acute right pneumothorax that required pigtail catheter placement.  Had a presumed intermittent air leak with better inflation of the right lung with pleural VAC in place even on waterseal.  He went home with an express mini chamber Today he reports   Review of Systems As per HPI      Objective:   Physical Exam Vitals:   04/09/21 1616  BP: 126/76  Pulse: 87  Temp: 98.1 F (36.7 C)  TempSrc: Oral  SpO2: 92%  Weight: 232 lb 12.8 oz (105.6 kg)  Height: 5\' 11"  (1.803 m)   Gen: Pleasant, well-nourished, in no distress,  normal affect  ENT: No lesions,  mouth clear,  oropharynx clear, no postnasal drip  Neck: No JVD, no stridor  Lungs: No use of accessory muscles, few insp R crackles  Cardiovascular: RRR, heart sounds normal, no murmur or gallops, no peripheral edema  Musculoskeletal: No deformities, no cyanosis or clubbing  Neuro: alert, awake, non focal  Skin: Warm, no lesions or rash      Assessment & Plan:  Pneumothorax Right pneumothorax.  Chest tube in place, currently on waterseal.  He drained about 350 cc of gold  fluid in the last 6 and half days.  We aspirated this today and now the Pleur-evac chamber is empty.  Plan to leave him to waterseal through the weekend.  Have him come in next week to be evaluated.  If doing well and if no air leak then would plan to clamp the chest tube and see how he tolerates.  Then repeat his chest x-ray in 2-3 days.  If lung remains well-inflated then should be able to remove his chest tube.  COPD with asthma ([place]) Anoro and albuterol.  Stage 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 ([place]) Should probably defer his  chemotherapy until the chest tube is out given risk for immunosuppression and infection.  Time spent 45 minutes  Baltazar Apo, MD, PhD 04/09/2021, 4:56 PM [facility] [phone] or if no answer before 7:00PM call [phone] For any issues after 7:00PM please call eLink [phone]

## 2021-04-14 ENCOUNTER — Emergency Department (HOSPITAL_COMMUNITY): Payer: 59

## 2021-04-14 ENCOUNTER — Telehealth: Payer: Self-pay | Admitting: Emergency Medicine

## 2021-04-14 ENCOUNTER — Observation Stay (HOSPITAL_COMMUNITY)
Admission: EM | Admit: 2021-04-14 | Discharge: 2021-04-15 | Disposition: A | Discharge status: Home / Self Care | Payer: 59 | Attending: Family Medicine | Admitting: Family Medicine

## 2021-04-14 ENCOUNTER — Ambulatory Visit: Payer: 59 | Admitting: Emergency Medicine

## 2021-04-14 ENCOUNTER — Ambulatory Visit (INDEPENDENT_AMBULATORY_CARE_PROVIDER_SITE_OTHER): Payer: 59

## 2021-04-14 ENCOUNTER — Encounter (HOSPITAL_COMMUNITY): Payer: Self-pay | Admitting: Emergency Medicine

## 2021-04-14 ENCOUNTER — Observation Stay (HOSPITAL_COMMUNITY): Payer: 59

## 2021-04-14 ENCOUNTER — Encounter: Payer: Self-pay | Admitting: Emergency Medicine

## 2021-04-14 ENCOUNTER — Ambulatory Visit: Payer: 59

## 2021-04-14 ENCOUNTER — Other Ambulatory Visit: Payer: Self-pay

## 2021-04-14 DIAGNOSIS — C3491 Malignant neoplasm of unspecified part of right bronchus or lung: Secondary | ICD-10-CM | POA: Diagnosis present

## 2021-04-14 DIAGNOSIS — J9611 Chronic respiratory failure with hypoxia: Secondary | ICD-10-CM

## 2021-04-14 DIAGNOSIS — Z7901 Long term (current) use of anticoagulants: Secondary | ICD-10-CM | POA: Diagnosis not present

## 2021-04-14 DIAGNOSIS — J939 Pneumothorax, unspecified: Secondary | ICD-10-CM | POA: Diagnosis not present

## 2021-04-14 DIAGNOSIS — I4819 Other persistent atrial fibrillation: Secondary | ICD-10-CM | POA: Diagnosis not present

## 2021-04-14 DIAGNOSIS — Z79899 Other long term (current) drug therapy: Secondary | ICD-10-CM | POA: Insufficient documentation

## 2021-04-14 DIAGNOSIS — Z20822 Contact with and (suspected) exposure to covid-19: Secondary | ICD-10-CM | POA: Insufficient documentation

## 2021-04-14 DIAGNOSIS — J449 Chronic obstructive pulmonary disease, unspecified: Secondary | ICD-10-CM

## 2021-04-14 DIAGNOSIS — Y848 Other medical procedures as the cause of abnormal reaction of the patient, or of later complication, without mention of misadventure at the time of the procedure: Secondary | ICD-10-CM | POA: Insufficient documentation

## 2021-04-14 DIAGNOSIS — J9312 Secondary spontaneous pneumothorax: Secondary | ICD-10-CM

## 2021-04-14 DIAGNOSIS — M069 Rheumatoid arthritis, unspecified: Secondary | ICD-10-CM | POA: Diagnosis present

## 2021-04-14 DIAGNOSIS — Z87891 Personal history of nicotine dependence: Secondary | ICD-10-CM | POA: Insufficient documentation

## 2021-04-14 DIAGNOSIS — R0602 Shortness of breath: Secondary | ICD-10-CM | POA: Diagnosis not present

## 2021-04-14 DIAGNOSIS — I480 Paroxysmal atrial fibrillation: Secondary | ICD-10-CM | POA: Diagnosis present

## 2021-04-14 DIAGNOSIS — T85698A Other mechanical complication of other specified internal prosthetic devices, implants and grafts, initial encounter: Secondary | ICD-10-CM | POA: Diagnosis not present

## 2021-04-14 LAB — CBC WITH DIFFERENTIAL/PLATELET
Abs Immature Granulocytes: 0.04 10*3/uL (ref 0.00–0.07)
Basophils Absolute: 0 10*3/uL (ref 0.0–0.1)
Basophils Relative: 1 %
Eosinophils Absolute: 1.8 10*3/uL — ABNORMAL HIGH (ref 0.0–0.5)
Eosinophils Relative: 22 %
HCT: 34.7 % — ABNORMAL LOW (ref 39.0–52.0)
Hemoglobin: 11.3 g/dL — ABNORMAL LOW (ref 13.0–17.0)
Immature Granulocytes: 1 %
Lymphocytes Relative: 11 %
Lymphs Abs: 0.9 10*3/uL (ref 0.7–4.0)
MCH: 28.5 pg (ref 26.0–34.0)
MCHC: 32.6 g/dL (ref 30.0–36.0)
MCV: 87.4 fL (ref 80.0–100.0)
Monocytes Absolute: 0.7 10*3/uL (ref 0.1–1.0)
Monocytes Relative: 9 %
Neutro Abs: 4.6 10*3/uL (ref 1.7–7.7)
Neutrophils Relative %: 56 %
Platelets: 455 10*3/uL — ABNORMAL HIGH (ref 150–400)
RBC: 3.97 MIL/uL — ABNORMAL LOW (ref 4.22–5.81)
RDW: 16.2 % — ABNORMAL HIGH (ref 11.5–15.5)
WBC: 8 10*3/uL (ref 4.0–10.5)
nRBC: 0 % (ref 0.0–0.2)

## 2021-04-14 LAB — COMPREHENSIVE METABOLIC PANEL
ALT: 11 U/L (ref 0–44)
AST: 13 U/L — ABNORMAL LOW (ref 15–41)
Albumin: 3.4 g/dL — ABNORMAL LOW (ref 3.5–5.0)
Alkaline Phosphatase: 65 U/L (ref 38–126)
Anion gap: 9 (ref 5–15)
BUN: 9 mg/dL (ref 6–20)
CO2: 25 mmol/L (ref 22–32)
Calcium: 8.6 mg/dL — ABNORMAL LOW (ref 8.9–10.3)
Chloride: 102 mmol/L (ref 98–111)
Creatinine, Ser: 1.05 mg/dL (ref 0.61–1.24)
GFR, Estimated: >60 mL/min (ref >60)
Glucose, Bld: 105 mg/dL — ABNORMAL HIGH (ref 70–99)
Potassium: 4.2 mmol/L (ref 3.5–5.1)
Sodium: 136 mmol/L (ref 135–145)
Total Bilirubin: 0.6 mg/dL (ref 0.3–1.2)
Total Protein: 7.1 g/dL (ref 6.5–8.1)

## 2021-04-14 LAB — RESP PANEL BY RT-PCR (FLU A&B, COVID) ARPGX2
Influenza A by PCR: NEGATIVE
Influenza B by PCR: NEGATIVE
SARS Coronavirus 2 by RT PCR: NEGATIVE

## 2021-04-14 MED ORDER — PAROXETINE HCL 10 MG PO TABS
10.0000 mg | ORAL_TABLET | Freq: Every day | ORAL | Status: DC
  Administered 2021-04-15: 10 mg via ORAL
  Filled 2021-04-14 (×2): qty 1

## 2021-04-14 MED ORDER — METOPROLOL SUCCINATE ER 25 MG PO TB24
25.0000 mg | ORAL_TABLET | Freq: Every day | ORAL | Status: DC
  Administered 2021-04-15: 25 mg via ORAL
  Filled 2021-04-14: qty 1

## 2021-04-14 MED ORDER — UMECLIDINIUM-VILANTEROL 62.5-25 MCG/ACT IN AEPB
1.0000 | INHALATION_SPRAY | Freq: Every day | RESPIRATORY_TRACT | Status: DC
  Administered 2021-04-15: 1 via RESPIRATORY_TRACT
  Filled 2021-04-14: qty 14

## 2021-04-14 MED ORDER — ATORVASTATIN CALCIUM 10 MG PO TABS
10.0000 mg | ORAL_TABLET | Freq: Every day | ORAL | Status: DC
  Administered 2021-04-15: 10 mg via ORAL
  Filled 2021-04-14: qty 1

## 2021-04-14 MED ORDER — PREDNISONE 5 MG PO TABS
5.0000 mg | ORAL_TABLET | Freq: Every day | ORAL | Status: DC
  Administered 2021-04-15: 5 mg via ORAL
  Filled 2021-04-14: qty 1

## 2021-04-14 MED ORDER — AMIODARONE HCL 200 MG PO TABS
200.0000 mg | ORAL_TABLET | Freq: Every day | ORAL | Status: DC
  Administered 2021-04-15: 200 mg via ORAL
  Filled 2021-04-14: qty 1

## 2021-04-14 NOTE — ED Provider Notes (Signed)
[facility] DEPT Provider Note   CSN: [id number] Arrival date & time: 04/14/21  1726     History  Chief Complaint  Patient presents with   Shortness of Breath    John [name] is a 61 y.o. male.  HPI  60 year old male with medical history significant for squamous cell carcinoma of the lung, rheumatoid arthritis, atrial fibrillation, COPD, chronic respiratory failure, recent admission to the hospital from 03/30/2021 to 04/03/2021 with a spontaneous pneumothorax, status post chest tube placement who presents to the emergency department today from pulmonary clinic with concern for chest tube malfunction.  Per Dr. Agustina Caroli note of pulmonary critical care from today, the patient on x-ray had worsening right-sided pneumothorax noted with increasing subcutaneous air.  Concern for chest tube malfunction and need to place the chest tube to suction.  His note also states "We will also need to confirm that it is well-positioned, that the sideport is actually in the pleural space. May need a Ct chest to confirm tube positioning if PTX does not resolve when he is placed to suction."    Home Medications Prior to Admission medications   Medication Sig Start Date End Date Taking? Authorizing Provider  amiodarone (PACERONE) 200 MG tablet Take 200 mg by mouth at bedtime.    [provider]  apixaban (ELIQUIS) 5 MG TABS tablet Take 1 tablet (5 mg total) by mouth 2 (two) times daily. 01/05/21   Fenton, Clint R, PA  atorvastatin (LIPITOR) 10 MG tablet Take 1 tablet (10 mg total) by mouth daily. 11/19/20 03/13/21  Imogene Burn, PA-C  dexamethasone (DECADRON) 4 MG tablet Take 2 tablets (8 mg total) by mouth 2 (two) times daily. Start the day before Taxotere. Then daily after chemo for 2 days. 01/29/21   Volanda Napoleon, MD  levalbuterol (XOPENEX) 0.63 MG/3ML nebulizer solution Take 3 mLs (0.63 mg total) by nebulization every 8 (eight) hours as needed for wheezing or  shortness of breath. 01/29/21   [name], MD  LORazepam (ATIVAN) 0.5 MG tablet Take 1 tablet (0.5 mg total) by mouth every 6 (six) hours as needed (Nausea or vomiting). 01/29/21   Volanda Napoleon, MD  metoprolol succinate (TOPROL XL) 25 MG 24 hr tablet Take one tablet by mouth daily 01/05/21   Fenton, Clint R, PA  ondansetron (ZOFRAN) 8 MG tablet Take 1 tablet (8 mg total) by mouth 2 (two) times daily as needed for refractory nausea / vomiting. 01/29/21   Volanda Napoleon, MD  PARoxetine (PAXIL) 10 MG tablet Take 10 mg by mouth at bedtime. 01/25/20   [provider]  predniSONE (DELTASONE) 5 MG tablet Take 1 tablet (5 mg total) by mouth daily. 03/13/21   Volanda Napoleon, MD  PRILOSEC OTC 20 MG tablet Take 20 mg by mouth at bedtime. 11/13/19   [provider]  prochlorperazine (COMPAZINE) 10 MG tablet Take 1 tablet (10 mg total) by mouth every 6 (six) hours as needed (Nausea or vomiting). 01/29/21   Volanda Napoleon, MD  umeclidinium-vilanterol (ANORO ELLIPTA) 62.5-25 MCG/ACT AEPB Inhale 1 puff into the lungs daily. 01/29/21 04/29/21  [name], MD      Allergies    Albuterol    Review of Systems   Review of Systems  All other systems reviewed and are negative.  Physical Exam Updated Vital Signs BP 124/77 (BP Location: Left Arm)    Pulse 80    Temp 97.8 F (36.6 C) (Oral)    Resp  18    SpO2 95%  Physical Exam Vitals and nursing note reviewed.  Constitutional:      General: He is not in acute distress. HENT:     Head: Normocephalic and atraumatic.  Eyes:     Conjunctiva/sclera: Conjunctivae normal.     Pupils: Pupils are equal, round, and reactive to light.  Cardiovascular:     Rate and Rhythm: Normal rate and regular rhythm.  Pulmonary:     Effort: Pulmonary effort is normal. No respiratory distress.     Breath sounds: Examination of the right-upper field reveals decreased breath sounds. Decreased breath sounds present.  Chest:     Comments: Extensive  subcutaneous emphysema along the patient's right-sided chest wall extending down the patient's right arm. Abdominal:     General: There is no distension.     Tenderness: There is no guarding.  Musculoskeletal:        General: No deformity or signs of injury.     Cervical back: Neck supple.  Skin:    Findings: No lesion or rash.  Neurological:     General: No focal deficit present.     Mental Status: He is alert. Mental status is at baseline.    ED Results / Procedures / Treatments   Labs (all labs ordered are listed, but only abnormal results are displayed) Labs Reviewed  CBC WITH DIFFERENTIAL/PLATELET - Abnormal; Notable for the following components:      Result Value   RBC 3.97 (*)    Hemoglobin 11.3 (*)    HCT 34.7 (*)    RDW 16.2 (*)    Platelets 455 (*)    Eosinophils Absolute 1.8 (*)    All other components within normal limits  COMPREHENSIVE METABOLIC PANEL - Abnormal; Notable for the following components:   Glucose, Bld 105 (*)    Calcium 8.6 (*)    Albumin 3.4 (*)    AST 13 (*)    All other components within normal limits  RESP PANEL BY RT-PCR (FLU A&B, COVID) [id number]  CBC    EKG EKG Interpretation  Date/Time:  Tuesday April 14 2021 18:48:44 EST Ventricular Rate:  79 PR Interval:  162 QRS Duration: 99 QT Interval:  404 QTC Calculation: 464 R Axis:   -60 Text Interpretation: Sinus rhythm Ventricular premature complex Left anterior fascicular block Low voltage, precordial leads RSR' in V1 or V2, right VCD or RVH Consider anterior infarct Confirmed by Regan Lemming ([id number]) on 04/14/2021 6:53:27 PM  Radiology DG Chest 2 View  Result Date: 04/14/2021 CLINICAL DATA:  Chest tube removal, history of pneumothorax, lung cancer EXAM: CHEST - 2 VIEW COMPARISON:  04/14/2021 at 5:55 p.m. FINDINGS: Frontal and lateral views of the chest demonstrate interval removal of the right pigtail drainage catheter seen previously. Extensive subcutaneous gas is again seen throughout  the right chest wall. There is evidence of a small recurrent right-sided pneumothorax, volume estimated approximately 10%. Slight increased consolidation in the right perihilar region consistent with atelectasis superimposed upon prior airspace disease. Stable patchy consolidation within the left lung base. No pleural effusion. No acute bony abnormalities. IMPRESSION: 1. Small recurrent right pneumothorax after chest tube removal, volume estimated 10%. 2. Extensive subcutaneous gas throughout the right chest wall, stable. 3. Bilateral lung consolidation, slightly increased in the right perihilar region. This likely reflects atelectasis superimposed upon chronic airspace disease. Critical Value/emergent results were called by telephone at the time of interpretation on 04/14/2021 at 7:01 pm to provider Regan Lemming , who verbally acknowledged these  results. Electronically Signed   By: Randa Ngo M.D.   On: 04/14/2021 19:05   DG Chest 2 View  Result Date: 04/14/2021 CLINICAL DATA:  Right pneumothorax EXAM: CHEST - 2 VIEW COMPARISON:  Previous studies including the examination done earlier today FINDINGS: Right chest tube is noted with its tip in the lateral aspect of right upper lung fields. There is interval resolution of small right apical pneumothorax. Subcutaneous emphysema is seen in the right chest wall and right lower neck. There is possible bulla or cavitary pneumonia in right parahilar region which has not changed significantly. There are linear densities in the lower lung fields with interval improvement. There are no new focal infiltrates. There is blunting of both lateral CP angles. IMPRESSION: There is interval resolution of right pneumothorax. There is improvement in aeration of lower lung fields suggesting resolving atelectasis/pneumonia. Electronically Signed   By: Elmer Picker M.D.   On: 04/14/2021 18:08   DG Chest 2 View  Result Date: 04/14/2021 CLINICAL DATA:  Shortness of breath.  EXAM: CHEST - 2 VIEW COMPARISON:  04/02/2021 FINDINGS: Two views of the chest demonstrated a right pneumothorax measuring at least 10-15% size. Difficult to evaluate the pneumothorax due to a large amount of subcutaneous gas in the right chest. Subcutaneous gas is new. However, the pneumothorax is likely enlarged since 04/02/2021. There is a pigtail chest tube which appears to be in the anterior right chest based on the lateral view. It is possible that a side hole could be outside of the chest. Again noted are patchy densities in the right perihilar region. Coarse interstitial densities in the periphery of the left lung are again noted. Trachea is midline. Heart size is stable. IMPRESSION: 1. Enlargement of the right pneumothorax measuring at least 10-15% in size but limited evaluation due to a large amount of subcutaneous gas. The subcutaneous gas is new. 2. Right chest tube is present and likely within the anterior right chest. Only a small portion of the tube appears to be within the chest and it is possible a side hole could be outside of the pleural space. 3. Persistent densities in the right perihilar region. 4. Chronic changes in the periphery of the left lung. Electronically Signed   By: Markus Daft M.D.   On: 04/14/2021 16:18    Procedures Procedures    Medications Ordered in ED Medications - No data to display  ED Course/ Medical Decision Making/ A&P                           Medical Decision Making Amount and/or Complexity of Data Reviewed Labs: ordered. Radiology: ordered.  Risk Decision regarding hospitalization.   61 year old male with medical history significant for squamous cell carcinoma of the lung, rheumatoid arthritis, atrial fibrillation, COPD, chronic respiratory failure, recent admission to the hospital from 03/30/2021 to 04/03/2021 with a spontaneous pneumothorax, status post chest tube placement who presents to the emergency department today from pulmonary clinic with  concern for chest tube malfunction.  Per Dr. Agustina Caroli note of pulmonary critical care from today, the patient on x-ray had worsening right-sided pneumothorax noted with increasing subcutaneous air.  Concern for chest tube malfunction and need to place the chest tube to suction.  His note also states "We will also need to confirm that it is well-positioned, that the sideport is actually in the pleural space. May need a Ct chest to confirm tube positioning if PTX does not resolve when he  is placed to suction."  On arrival, the patient was afebrile, hemodynamically stable, sinus rhythm noted on cardiac telemetry.  The patient was saturating between 88% and 92% on room air and was placed on 2 L O2 via nasal cannula.  His chest tube was hooked up to suction via Pleur-evac at -20 mmHg.  A portable chest x-ray was performed bedside.  I did speak with on-call pulmonary critical care who recommended x-ray imaging as ordered, potential CT imaging post x-ray imaging to evaluate for chest tube placement.  Patient will likely need admission to the hospitalist service with pulmonary consultation for continued management.  Screening EKG and labs ordered.  The patient's EKG revealed sinus rhythm, ventricular rate 79, no acute ST segment changes.  COVID-19 and influenza testing resulted negative.  CBC without a leukocytosis, anemia at 11.3, CMP without significant electrolyte abnormality, normal renal and liver function.  After discussion with critical care bedside, the decision was made to pull the patient's chest tube as it was felt that part of the chest tube was out of the patient's chest and in the subcutaneous space.  Patient's chest tube was pulled by pulmonology bedside.  Repeat chest x-ray was performed which was reviewed by myself and radiology and did show residual pneumothorax to 10%.  After discussion with pulmonology, no need for recurrent chest tube placement.  Will admit the patient for observation.  Dr.  Flossie Buffy of hospitalist medicine was consulted and admitted the patient in observation.  Final Clinical Impression(s) / ED Diagnoses Final diagnoses:  Pneumothorax, unspecified type    Rx / DC Orders ED Discharge Orders     None         Regan Lemming, MD 04/14/21 2048

## 2021-04-14 NOTE — Telephone Encounter (Signed)
Appt scheduled 04/14/2021 at 4:30 with STAT CXR,  Patient is aware and voiced his understanding.  Nothing further needed.

## 2021-04-14 NOTE — Progress Notes (Signed)
Subjective:    Patient ID: John [name], male    DOB: 22-Oct-1960, 61 y.o.   MRN: [id number]  HPI  ROV / Hosp F/u 04/09/21 --John [name] is 61 with a history of stage IVa squamous cell lung cancer involving the right upper lobe.  He also has associated mild obstructive lung disease.  He has been managed on Keytruda but had associated pneumonitis that required a prolonged prednisone taper.  He has chronic hypoxemic respiratory failure and is on 2 L/min.  He was unfortunately admitted 03/30/2021 with acute right pneumothorax that required pigtail catheter placement.  Had a presumed intermittent air leak with better inflation of the right lung with pleural VAC in place even on waterseal.  He went home with an express mini chamber Today he reports that his breathing has been stable. He has over 350cc in his express mini chamber, intermittent air leak on cough  Acute OV 04/14/2021 --John [name] returns today after noting significant right upper extremity and right chest swelling.  No real change in his breathing as he does have baseline exertional shortness of breath.  He notes that since I last saw him 2/23 they have had to empty his express mini of approximately 450 cc.  Chest x-ray performed on arrival today shows slight worsening of his right apical pneumothorax and significant subcutaneous air in the pectoral region, right upper extremity.   Review of Systems As per HPI      Objective:   Physical Exam Vitals:   04/14/21 1612  BP: 134/76  Pulse: 87  Temp: 98.1 F (36.7 C)  TempSrc: Oral  SpO2: 96%  Weight: 233 lb (105.7 kg)  Height: 5\' 11"  (1.803 m)   Gen: Pleasant, well-nourished, in no distress, depressed affect  ENT: No lesions,  mouth clear,  oropharynx clear, no postnasal drip  Neck: No JVD, no stridor  Lungs: No use of accessory muscles, few insp R crackles, he does have an air leak in the Pleur-evac chamber on cough, otherwise there is none.  Cardiovascular: RRR, heart sounds  normal, no murmur or gallops, no peripheral edema  Musculoskeletal: No deformities, significant subcutaneous air and crepitance palpated in the right chest, right upper extremity  Neuro: alert, awake, non focal  Skin: Warm, no lesions or rash        Assessment & Plan:  Pneumothorax Worsening right pneumothorax noted on chest x-ray, increased subcutaneous air.  Clearly the chest tube not working adequately, and he needs to be to suction.  We will also need to confirm that it is well-positioned, that the sideport is actually in the pleural space. May need a Ct chest to confirm tube positioning if PTX does not resolve when he is placed to suction.   Unfortunately I am not able to directly admit the patient to the hospital at this time due to backlog of admissions.  I have spoken to the triage nurse at [facility] ED and we will get him there to be placed on -20 cm water suction with a new Pleur-evac.  I will also discuss with our hospital consultant so we can see John [name] when he arrives.  COPD with asthma ([place]) Continue with current bronchodilator regimen  Stage 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 ([place]) Therapy currently on hold while he has an indwelling chest tube.    Baltazar Apo, MD, PhD 04/14/2021, 5:06 PM [facility] [phone] or if no answer before 7:00PM call [phone] For any issues after 7:00PM please call  eLink [phone]

## 2021-04-14 NOTE — ED Notes (Signed)
Chest tube removed by critial care/pulmonology.

## 2021-04-14 NOTE — ED Triage Notes (Signed)
Pt sent from pulmonology office for further management of chest tube. PT denies pain or SHOB at this time

## 2021-04-14 NOTE — Consult Note (Addendum)
NAME:  John [name], MRN:  [id number], DOB:  Jun 21, 1960, LOS: 0 ADMISSION DATE:  04/14/2021 CONSULTATION DATE:  04/14/2021 REFERRING MD:  Armandina Gemma - EDP CHIEF COMPLAINT: Dyspnea, known R PTX  History of Present Illness:  61 year old man who presented to [facility] ED 2/28 from [facility] for worsening R pneumothorax in the setting of chest tube dysfunction. 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 Recently admitted 2/13 - 2/17 for spontaneous R PTX requiring pigtail insertion.   Patient initially [facility] pulmonary office today for follow-up with Dr. [name] after he noticed increased swelling of his RUE/R chest around his CT site/tracking into his R arm. He also reported ongoing shortness of breath, which is baseline for him. In-office CXR demonstrated worsening of his R apical pneumothorax and significant subcutaneous air in the pectoral region/RUE.  He was subsequently sent to [facility] ED for chest tube troubleshooting, including placement back to suction via Pleur-evac. ED CXR demonstrated improvement of PTX on suction.  PCCM consulted for CT management.  Pertinent Medical History:   Past Medical History:  Diagnosis Date   Atrial fibrillation with RVR ([place]) 05/12/2020   Dyspnea    Family history of adverse reaction to anesthesia    brother with seizures had episode under anesthesia.  Patient has seizures as well.   GERD (gastroesophageal reflux disease)    History of radiation therapy 04/24/2020-05/16/2020   IMRT to right lung     Dr [name]   PAF (paroxysmal atrial fibrillation) ([place])    CHADS2VSAC score 0   Rheumatoid aortitis    Sepsis ([place]) 10/06/2020   Squamous cell lung cancer ([place])    Significant Hospital Events: Including procedures, antibiotic start and stop dates in addition to  other pertinent events   2/13 - Admitted to [facility] with spontaneous R PTX. R pigtail placed. 2/16 - Pigtail removed; unfortunately had recurrence of PTX (70%) requiring pigtail replacement. 2/17 - Pigtail placed on waterseal with continued good lung expansion. Transitioned to [facility] for discharge home. 2/23 - Office f/u with Dr. [name]; pigtail to waterseal with 356mL gold-colored fluid in [facility]. Plan for f/u and clamping if no air leak/possible CT removal. 2/28 - Presented to [facility] with SOB and increased RUE/R chest swelling; office CXR with increased apical PTX and subcutaneous air (tracking down arm). Repeat ED CXR with improved PTX (on suction). PCCM consulted for CT troubleshooting. Pigtail removed. Repeat CXR pending.  Interim History / Subjective:  PCCM consulted for chest tube dysfunction Patient appears uncomfortable at present, states this is better than he was feeling in the office Chest tube is currently to suction via Pleur-evac/wall suction Palpable subcutaneous emphysema tracking down into the right forearm He is apprehensive about chest tube manipulation as it has been painful for him in the past  Objective:  Blood pressure (!) 143/101, pulse 81, temperature 98.1 F (36.7 C), resp. rate 18, SpO2 92 %.       No intake or output data in the 24 hours ending 04/14/21 1832 There were no vitals filed for this visit.  Physical Examination: General: Acutely ill-appearing middle-aged man in NAD. Appears uncomfortable 2/2 R chest tube. HEENT: [place]/AT, anicteric sclera, PERRL, moist mucous membranes. Neuro: Awake, oriented x 4. Responds to verbal stimuli. Following commands consistently. Moves all 4 extremities spontaneously. Chest: R chest with anterior  approach pigtail catheter in place. Stitch present, but black markers visible on portion of catheter exterior to skin suggesting dislodgement. Mild surrounding erythema.  CV: RRR, no m/g/r. PULM: Breathing even and  unlabored on 3LNC. Lung fields decreased on R, clear to auscultation on L. GI: Soft, nontender, nondistended. Normoactive bowel sounds. Extremities: No LE edema noted. Subcutaneous emphysema noted along medial RUE from axilla to forearm. Skin: Warm/dry, no rashes.  Resolved Hospital Problem List:    Assessment & Plan:  Mr. Sedore is seen in consultation at the request of Dr. Armandina Gemma ([place]) for further evaluation and management of chest tube dysfunction/R pneumothorax.  On assessment at bedside, Mr. Leger is visibly uncomfortable secondary to pain related to his pigtail catheter in his right chest.  Pigtail with black markers visible on catheter portion exterior to skin, suggesting dislodgment.  He also has significant right upper extremity subcutaneous emphysema from right axilla to right forearm.  Discussed with Dr. Elsworth Soho, patient/wife at bedside; options include leaving chest tube in place to suction overnight with repeat CXR in AM or removal of chest tube with repeat CXR this evening to ensure no recurrence of PTX.  We do not feel that the existing pigtail in its current position is benefiting patient at this time.  Pigtail catheter removed at bedside by myself with Dr. Elsworth Soho present.  Patient tolerated procedure well.  No significant change in oxygen saturations or oxygen requirements post procedure.  Persistent R pneumothorax Chronic hypoxemic respiratory failure History of stage IVa squamous cell lung CA  - Repeat CXR in ED 2/28PM with 10% recurrent PTX - Repeat CXR in AM - If patient experiences worsening SOB/dyspnea or increased supplemental O2 requirements, repeat CXR tonight - Will consider pigtail replacement if PTX worsening - Continue supplemental O2 for goal sats greater than 90% - Bronchodilators as needed - Pulmonary hygiene  PCCM will continue to follow  Best Practice: (right click and "Reselect all SmartList Selections" daily)   Per Primary Team  Labs:  CBC: Recent  Labs  Lab 04/14/21 1756  WBC 8.0  NEUTROABS 4.6  HGB 11.3*  HCT 34.7*  MCV 87.4  PLT 196*   Basic Metabolic Panel: No results for input(s): NA, K, CL, CO2, GLUCOSE, BUN, CREATININE, CALCIUM, MG, PHOS in the last 168 hours. GFR: Estimated Creatinine Clearance: 92 mL/min (by C-G formula based on SCr of 1.07 mg/dL). Recent Labs  Lab 04/14/21 1756  WBC 8.0   Liver Function Tests: No results for input(s): AST, ALT, ALKPHOS, BILITOT, PROT, ALBUMIN in the last 168 hours. No results for input(s): LIPASE, AMYLASE in the last 168 hours. No results for input(s): AMMONIA in the last 168 hours.  ABG:    Component Value Date/Time   HCO3 27.5 12/22/2020 1002   TCO2 27 12/22/2020 1035   O2SAT 37.2 12/22/2020 1002    Coagulation Profile: No results for input(s): INR, PROTIME in the last 168 hours.  Cardiac Enzymes: No results for input(s): CKTOTAL, CKMB, CKMBINDEX, TROPONINI in the last 168 hours.  HbA1C: No results found for: HGBA1C  CBG: No results for input(s): GLUCAP in the last 168 hours.  Review of Systems:   Review of systems completed with pertinent positives/negatives outlined in above HPI  Past Medical History:  He,  has a past medical history of Atrial fibrillation with RVR ([place]) (05/12/2020), Dyspnea, Family history of adverse reaction to anesthesia, GERD (gastroesophageal reflux disease), History of radiation therapy (04/24/2020-05/16/2020), PAF (paroxysmal atrial fibrillation) ([place]), Rheumatoid aortitis, Sepsis ([place]) (10/06/2020), and Squamous cell  lung cancer ([place]).   Surgical History:   Past Surgical History:  Procedure Laterality Date   BRONCHIAL BRUSHINGS  03/27/2020   Procedure: BRONCHIAL BRUSHINGS;  Surgeon: [name], MD;  Location: [place];  Service: Cardiopulmonary;;   BUBBLE STUDY  10/13/2020   Procedure: BUBBLE STUDY;  Surgeon: Pixie Casino, MD;  Location: [place];  Service: Cardiovascular;;   CARDIOVERSION N/A 10/13/2020   Procedure:  CARDIOVERSION;  Surgeon: Pixie Casino, MD;  Location: [facility] ENDOSCOPY;  Service: Cardiovascular;  Laterality: N/A;   CATARACT EXTRACTION  2016   at [place]  03/27/2020   Procedure: FINE NEEDLE ASPIRATION;  Surgeon: [name], MD;  Location: [place];  Service: Cardiopulmonary;;   HIP SURGERY Left    TEE WITHOUT CARDIOVERSION N/A 10/13/2020   Procedure: TRANSESOPHAGEAL ECHOCARDIOGRAM (TEE);  Surgeon: Pixie Casino, MD;  Location: [place];  Service: Cardiovascular;  Laterality: N/A;   VIDEO BRONCHOSCOPY WITH ENDOBRONCHIAL ULTRASOUND N/A 03/27/2020   Procedure: VIDEO BRONCHOSCOPY WITH ENDOBRONCHIAL ULTRASOUND;  Surgeon: [name], MD;  Location: [place];  Service: Cardiopulmonary;  Laterality: N/A;    Social History:   reports that he quit smoking about 5 years ago. His smoking use included cigarettes. He has a 30.00 pack-year smoking history. He has never used smokeless tobacco. He reports that he does not currently use alcohol. He reports that he does not use drugs.   Family History:  His family history includes Arrhythmia in his mother; Heart disease in his father.   Allergies: Allergies  Allergen Reactions   Albuterol Other (See Comments)    Patient has had episode of atrial fib following administration of albuterol (tolerates Xopenex well)    Home Medications: Prior to Admission medications   Medication Sig Start Date End Date Taking? Authorizing Provider  amiodarone (PACERONE) 200 MG tablet Take 200 mg by mouth at bedtime.    [provider]  apixaban (ELIQUIS) 5 MG TABS tablet Take 1 tablet (5 mg total) by mouth 2 (two) times daily. 01/05/21   Fenton, Clint R, PA  atorvastatin (LIPITOR) 10 MG tablet Take 1 tablet (10 mg total) by mouth daily. 11/19/20 03/13/21  Imogene Burn, PA-C  dexamethasone (DECADRON) 4 MG tablet Take 2 tablets (8 mg total) by mouth 2 (two) times daily. Start the day before Taxotere. Then daily after  chemo for 2 days. 01/29/21   Volanda Napoleon, MD  levalbuterol (XOPENEX) 0.63 MG/3ML nebulizer solution Take 3 mLs (0.63 mg total) by nebulization every 8 (eight) hours as needed for wheezing or shortness of breath. 01/29/21   [name], MD  LORazepam (ATIVAN) 0.5 MG tablet Take 1 tablet (0.5 mg total) by mouth every 6 (six) hours as needed (Nausea or vomiting). 01/29/21   Volanda Napoleon, MD  metoprolol succinate (TOPROL XL) 25 MG 24 hr tablet Take one tablet by mouth daily 01/05/21   Fenton, Clint R, PA  ondansetron (ZOFRAN) 8 MG tablet Take 1 tablet (8 mg total) by mouth 2 (two) times daily as needed for refractory nausea / vomiting. 01/29/21   Volanda Napoleon, MD  PARoxetine (PAXIL) 10 MG tablet Take 10 mg by mouth at bedtime. 01/25/20   [provider]  predniSONE (DELTASONE) 5 MG tablet Take 1 tablet (5 mg total) by mouth daily. 03/13/21   Volanda Napoleon, MD  PRILOSEC OTC 20 MG tablet Take 20 mg by mouth at bedtime. 11/13/19   [provider]  prochlorperazine (COMPAZINE)  10 MG tablet Take 1 tablet (10 mg total) by mouth every 6 (six) hours as needed (Nausea or vomiting). 01/29/21   Volanda Napoleon, MD  umeclidinium-vilanterol (ANORO ELLIPTA) 62.5-25 MCG/ACT AEPB Inhale 1 puff into the lungs daily. 01/29/21 04/29/21  [name], MD       Lestine Mount, PA-C [facility] 04/14/21 6:32 PM  Please see [email or web address] for pager details.  From 7A-7P if no response, please call [phone] After hours, please call ELink [phone]

## 2021-04-14 NOTE — H&P (Signed)
History and Physical    Patient: John [name] LNL:[id number] DOB: 10-24-1960 DOA: 04/14/2021 DOS: the patient was seen and examined on 04/14/2021 PCP: [name], MD  Patient coming from: Home  Chief Complaint:  Chief Complaint  Patient presents with   Shortness of Breath    HPI: Vaughn Beaumier is a 61 y.o. male with medical history significant of hx of stage IV squamous cell carinoma of the right lung, COPD with chronic respiratory failure on 2 to 3 L PRN, rheumatoid arthritis, persistent atrial fibrillation on Eliquis, recent pneumothorax who presents at request of pulmonology for chest tube dysfunction.  Patient recently hospitalized from 2/13 to 2/17 with spontaneous right pneumothorax.  He had chest tube placement which was subsequently removed on 2/15.  However on 2/16 showed recurrent pneumothorax about 70%.  Had chest tube reinserted on 2/16 and transition to portable chest tube chamber. Had follow up with pulmonology on 2/23 with about 350cc gold color fluid removed from pleur-evac chamber.  Followed up again today with pulmonology Dr. [name] but had right chest and right UE swelling. Repeat CXR showed worening right pneumothorax with increased subcutaneous air and pt was sent to ED.   In the ED, Pleur-evac was placed back to suction with improvement of pneumothorax on repeat CXR. Pulmonology has seen pt at bedside and decided to remove pigtail catheter since it was obvsiously dislodged. CXR following pneumothorax shows small recurrent right pneumothorax about 10%. Plan of repeat CXR tomorrow AM unless he has clinical decline overnight.   Patient noted increase swelling of right chest and right UE yesterday. Denies any worsening shortness of breath, cough or chest pain.    Review of Systems: As mentioned in the history of present illness. All other systems reviewed and are negative. Past Medical History:  Diagnosis Date   Atrial fibrillation with RVR ([place]) 05/12/2020    Dyspnea    Family history of adverse reaction to anesthesia    brother with seizures had episode under anesthesia.  Patient has seizures as well.   GERD (gastroesophageal reflux disease)    History of radiation therapy 04/24/2020-05/16/2020   IMRT to right lung     Dr [name]   PAF (paroxysmal atrial fibrillation) ([place])    CHADS2VSAC score 0   Rheumatoid aortitis    Sepsis ([place]) 10/06/2020   Squamous cell lung cancer [facility])    Past Surgical History:  Procedure Laterality Date   BRONCHIAL BRUSHINGS  03/27/2020   Procedure: BRONCHIAL BRUSHINGS;  Surgeon: [name], MD;  Location: [place];  Service: Cardiopulmonary;;   BUBBLE STUDY  10/13/2020   Procedure: BUBBLE STUDY;  Surgeon: Pixie Casino, MD;  Location: [place];  Service: Cardiovascular;;   CARDIOVERSION N/A 10/13/2020   Procedure: CARDIOVERSION;  Surgeon: Pixie Casino, MD;  Location: [facility] ENDOSCOPY;  Service: Cardiovascular;  Laterality: N/A;   CATARACT EXTRACTION  2016   at [place]  03/27/2020   Procedure: FINE NEEDLE ASPIRATION;  Surgeon: [name], MD;  Location: [place];  Service: Cardiopulmonary;;   HIP SURGERY Left    TEE WITHOUT CARDIOVERSION N/A 10/13/2020   Procedure: TRANSESOPHAGEAL ECHOCARDIOGRAM (TEE);  Surgeon: Pixie Casino, MD;  Location: [place];  Service: Cardiovascular;  Laterality: N/A;   VIDEO BRONCHOSCOPY WITH ENDOBRONCHIAL ULTRASOUND N/A 03/27/2020   Procedure: VIDEO BRONCHOSCOPY WITH ENDOBRONCHIAL ULTRASOUND;  Surgeon: [name], MD;  Location: [place];  Service: Cardiopulmonary;  Laterality: N/A;   Social History:  reports that he quit smoking  about 5 years ago. His smoking use included cigarettes. He has a 30.00 pack-year smoking history. He has never used smokeless tobacco. He reports that he does not currently use alcohol. He reports that he does not use drugs.  Allergies  Allergen Reactions   Albuterol Other (See Comments)    Patient  has had episode of atrial fib following administration of albuterol (tolerates Xopenex well)    Family History  Problem Relation Age of Onset   Arrhythmia Mother        has PPM   Heart disease Father        Died at 59, started in his 64s, heart attacks, had PPM and ICD    Prior to Admission medications   Medication Sig Start Date End Date Taking? Authorizing Provider  amiodarone (PACERONE) 200 MG tablet Take 200 mg by mouth at bedtime.    [provider]  apixaban (ELIQUIS) 5 MG TABS tablet Take 1 tablet (5 mg total) by mouth 2 (two) times daily. 01/05/21   Fenton, Clint R, PA  atorvastatin (LIPITOR) 10 MG tablet Take 1 tablet (10 mg total) by mouth daily. 11/19/20 03/13/21  Imogene Burn, PA-C  dexamethasone (DECADRON) 4 MG tablet Take 2 tablets (8 mg total) by mouth 2 (two) times daily. Start the day before Taxotere. Then daily after chemo for 2 days. 01/29/21   Volanda Napoleon, MD  levalbuterol (XOPENEX) 0.63 MG/3ML nebulizer solution Take 3 mLs (0.63 mg total) by nebulization every 8 (eight) hours as needed for wheezing or shortness of breath. 01/29/21   [name], MD  LORazepam (ATIVAN) 0.5 MG tablet Take 1 tablet (0.5 mg total) by mouth every 6 (six) hours as needed (Nausea or vomiting). 01/29/21   Volanda Napoleon, MD  metoprolol succinate (TOPROL XL) 25 MG 24 hr tablet Take one tablet by mouth daily 01/05/21   Fenton, Clint R, PA  ondansetron (ZOFRAN) 8 MG tablet Take 1 tablet (8 mg total) by mouth 2 (two) times daily as needed for refractory nausea / vomiting. 01/29/21   Volanda Napoleon, MD  PARoxetine (PAXIL) 10 MG tablet Take 10 mg by mouth at bedtime. 01/25/20   [provider]  predniSONE (DELTASONE) 5 MG tablet Take 1 tablet (5 mg total) by mouth daily. 03/13/21   Volanda Napoleon, MD  PRILOSEC OTC 20 MG tablet Take 20 mg by mouth at bedtime. 11/13/19   [provider]  prochlorperazine (COMPAZINE) 10 MG tablet Take 1 tablet (10 mg total) by  mouth every 6 (six) hours as needed (Nausea or vomiting). 01/29/21   Volanda Napoleon, MD  umeclidinium-vilanterol (ANORO ELLIPTA) 62.5-25 MCG/ACT AEPB Inhale 1 puff into the lungs daily. 01/29/21 04/29/21  [name], MD    Physical Exam: Vitals:   04/14/21 1741 04/14/21 1850 04/14/21 1925  BP: (!) 143/101 114/76 115/81  Pulse: 81 77 83  Resp: 18 19 (!) 22  Temp: 98.1 F (36.7 C)  97.9 F (36.6 C)  TempSrc:   Oral  SpO2: 92% 95% 95%   Constitutional: NAD, calm, comfortable, obese male laying flat in bed Eyes:  lids and conjunctivae normal ENMT: Mucous membranes are moist.  Neck: normal, supple, Respiratory: clear to auscultation bilaterally, no wheezing, no crackles. Normal respiratory effort on 3L via [place]. No accessory muscle use.  Cardiovascular: Regular rate and rhythm, no murmurs / rubs / gallops. No extremity edema. Edema of the right medial anterior chest. Edema of right UE with crepitus to biceps region.  Abdomen: no tenderness, Bowel sounds positive.  Musculoskeletal: no clubbing / cyanosis. No joint deformity upper and lower extremities. Good ROM, no contractures. Normal muscle tone.  Skin: no rashes, lesions, ulcers. Neurologic: CN 2-12 grossly intact. Sensation intact. Strength 5/5 in all 4.  Psychiatric: Normal judgment and insight. Alert and oriented x 3. Normal mood.  Data Reviewed:  CBC and BMP unremarkable and similar to prior  Assessment and Plan:  Hx of recurrent pneumothorax with chest tube dysfunction -Recently hospitalized from 2/13 to 2/17 with spontaneous right pneumothorax.  Had recurrent pneumothorax about 70% on 2/16 after pigtail catheter removal.  Had chest tube reinserted on 2/16 and transition to portable chest tube chamber.  -Followed up again today with pulmonology Dr. [name] but had right chest and right UE swelling. Repeat CXR showed worening right pneumothorax with increased subcutaneous air  -Pulmonology has evaluated at bedside and chest  tube removed since it was obviously dislodged. Repeat CXR showing about 10% recurrence of pneumothorax. Will monitor respiratory status closely tonight with planned repeat CXR in the morning.   Chronic hypoxemia respiratory with COPD -baseline on 2 -3L PRN. Currently stable.   Persistent atrial fibrillation Continue amiodarone, metoprolol. Hold Eliquis for now since he is high risk for potentially needing re-insertion of chest tube. Last dose this AM  Stage IV squamous cell carinoma of the right lung -Follows with [name]. Currently on pembrolizumab and Taxotere as outpatient. -Treatment postponed pending recovery from pneumothorax  RA (rheumatoid arthritis)  On daily prednisone at home  Advance Care Planning:   Code Status: Prior Full  Consults: Pulmonology  Family Communication: wife at bedside  Severity of Illness: The appropriate patient status for this patient is OBSERVATION. Observation status is judged to be reasonable and necessary in order to provide the required intensity of service to ensure the patient's safety. The patient's presenting symptoms, physical exam findings, and initial radiographic and laboratory data in the context of their medical condition is felt to place them at decreased risk for further clinical deterioration. Furthermore, it is anticipated that the patient will be medically stable for discharge from the hospital within 2 midnights of admission.   Author: Orene Desanctis, DO 04/14/2021 7:27 PM  For on call review [email or web address].

## 2021-04-14 NOTE — ED Notes (Signed)
Per Dr Cheri Fowler coming in with right pneumo  thorax-patient currently has chest tube placed-states it is not functioning properly-sending patient over for wall suction and hospital admission

## 2021-04-14 NOTE — ED Notes (Signed)
Pt switched to saharah chest tube drainage system.

## 2021-04-14 NOTE — Assessment & Plan Note (Signed)
Therapy currently on hold while he has an indwelling chest tube.

## 2021-04-14 NOTE — Telephone Encounter (Signed)
We should get him in to be seen today w a CXR to eval pneumothorax. Make an OV w RB or APP please

## 2021-04-14 NOTE — Assessment & Plan Note (Addendum)
Continue with current bronchodilator regimen

## 2021-04-14 NOTE — Telephone Encounter (Signed)
Dr [name] Please advise:   Patient is calling because he feels like the left side of his chest has air in it, pt states he thumped the left side of his chest and it sounded hollow. Patient currently has chest tube. Pt denies any pain.

## 2021-04-14 NOTE — Assessment & Plan Note (Signed)
Worsening right pneumothorax noted on chest x-ray, increased subcutaneous air.  Clearly the chest tube not working adequately, and he needs to be to suction.  We will also need to confirm that it is well-positioned, that the sideport is actually in the pleural space. May need a Ct chest to confirm tube positioning if PTX does not resolve when he is placed to suction.   Unfortunately I am not able to directly admit the patient to the hospital at this time due to backlog of admissions.  I have spoken to the triage nurse at [facility] ED and we will get him there to be placed on -20 cm water suction with a new Pleur-evac.  I will also discuss with our hospital consultant so we can see John [name] when he arrives.

## 2021-04-15 ENCOUNTER — Observation Stay (HOSPITAL_COMMUNITY): Payer: 59

## 2021-04-15 ENCOUNTER — Encounter (HOSPITAL_COMMUNITY): Payer: Self-pay | Admitting: Family Medicine

## 2021-04-15 ENCOUNTER — Telehealth: Payer: Self-pay | Admitting: Physician Assistant

## 2021-04-15 DIAGNOSIS — C3491 Malignant neoplasm of unspecified part of right bronchus or lung: Secondary | ICD-10-CM | POA: Diagnosis not present

## 2021-04-15 DIAGNOSIS — J939 Pneumothorax, unspecified: Secondary | ICD-10-CM | POA: Diagnosis not present

## 2021-04-15 DIAGNOSIS — J9611 Chronic respiratory failure with hypoxia: Secondary | ICD-10-CM | POA: Diagnosis not present

## 2021-04-15 LAB — CBC
HCT: 32.3 % — ABNORMAL LOW (ref 39.0–52.0)
Hemoglobin: 10.2 g/dL — ABNORMAL LOW (ref 13.0–17.0)
MCH: 28.3 pg (ref 26.0–34.0)
MCHC: 31.6 g/dL (ref 30.0–36.0)
MCV: 89.5 fL (ref 80.0–100.0)
Platelets: 322 10*3/uL (ref 150–400)
RBC: 3.61 MIL/uL — ABNORMAL LOW (ref 4.22–5.81)
RDW: 16.3 % — ABNORMAL HIGH (ref 11.5–15.5)
WBC: 6.8 10*3/uL (ref 4.0–10.5)
nRBC: 0 % (ref 0.0–0.2)

## 2021-04-15 NOTE — Discharge Instructions (Signed)
Advised to follow-up with primary care physician in 1 week. ?Advised to discuss with cardiology about amiodarone dosing. ?Advised to follow-up with pulmonology for repeat chest x-ray in 48 hours. ?

## 2021-04-15 NOTE — Hospital Course (Signed)
This 61 years old male with PMH significant for stage 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, COPD with chronic hypoxic respiratory failure on 2 to 3 L of oxygen as needed, rheumatoid arthritis, persistent atrial fibrillation on Eliquis, recent pneumothorax who presents in the ED at the request of his pulmonologist for chest tube dysfunction.   ?Patient was recently hospitalized from 03/30/2021 through 04/03/2021 with  spontaneous right pneumothorax. He had a chest tube placement which was subsequently removed on 2/15 however on 04/02/21 Patient developed recurrent pneumothorax about 70%. Chest tube was reinserted on 2/16 and transitioned to portable chest tube chamber.  Patient had  follow-up with pulmonology on 04/09/21 with about 350 cc gold colored fluid removed from pleural -evac chamber.  Patient had a follow-up with pulmonology again yesterday and had right chest and right upper extremity swelling. Repeat chest x-ray shows worsening right pneumothorax with increased subcutaneous air.   ?Patient was admitted for recurrence of pneumothorax.  In the ED pleur-evac was placed back to suction with improvement in pneumothorax on repeat chest x-ray.  Pulmonology consulted decided to remove the pigtail catheter since it was obviously dislodged.  Chest x-ray shows improving right pneumothorax.  Repeat chest x-ray in the morning and in the afternoon shows almost complete resolution of pneumothorax.  Pulmonology cleared the patient for discharge.  Patient has no symptoms.  Patient will follow up with pulmonology in 48 hours for repeat chest x-ray to ensure resolution of pneumothorax.  Patient is being discharged home. ?

## 2021-04-15 NOTE — Telephone Encounter (Signed)
Spoke with pt who is still currently in hospital. Confirmed appointment with Tammy on 04/17/21 at noon. Pt voiced understanding. Nothing further needed at this time.  ? ?Routing to [employer] as FYI ?

## 2021-04-15 NOTE — Discharge Summary (Signed)
Physician Discharge Summary   Patient: John [name] MRN: [id number] DOB: [birth year]  Admit date:     04/14/2021  Discharge date: 04/15/21  Discharge Physician: Shawna Clamp   PCP: [name], MD   Recommendations at discharge:  Advised to follow-up with primary care physician in 1 week. Please repeat chest x-ray in 48 hours to ensure resolution of pneumothorax. Follow-up cardiology to discuss about amiodarone. Please repeat CBC and BMP in 1 week.   Discharge Diagnoses: Principal Problem:   Pneumothorax on right Active Problems:   Stage 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 (HCC)   Persistent atrial fibrillation (HCC)   RA (rheumatoid arthritis) (HCC)   Chronic respiratory failure with hypoxia (HCC)  Resolved Problems:   * No resolved hospital problems. [facility] Hospital Course: This 61 years old male with PMH significant for stage 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, COPD with chronic hypoxic respiratory failure on 2 to 3 L of oxygen as needed, rheumatoid arthritis, persistent atrial fibrillation on Eliquis, recent pneumothorax who presents in the ED at the request of his pulmonologist for chest tube dysfunction.   Patient was recently hospitalized from 03/30/2021 through 04/03/2021 with  spontaneous right pneumothorax. He had a chest tube placement which was subsequently removed on 2/15 however on 04/02/21 Patient developed recurrent pneumothorax about 70%. Chest tube was reinserted on 2/16 and transitioned to portable chest tube chamber.  Patient had  follow-up with pulmonology on 04/09/21 with about 350 cc gold colored fluid removed from pleural -evac chamber.  Patient had a follow-up with pulmonology again yesterday and had right chest and right upper extremity swelling. Repeat chest x-ray shows worsening right pneumothorax with increased subcutaneous air.   Patient was admitted for recurrence of pneumothorax.  In the ED pleur-evac was placed back to suction with improvement  in pneumothorax on repeat chest x-ray.  Pulmonology consulted,  decided to remove the pigtail catheter since it was obviously dislodged.  Chest x-ray shows improving right pneumothorax.  Repeat chest x-ray in the morning and in the afternoon shows almost complete resolution of pneumothorax.  Pulmonology cleared the patient for discharge.  Patient has no symptoms.  Patient will follow up with pulmonology in 48 hours for repeat chest x-ray to ensure resolution of pneumothorax.  Patient is being discharged home.  Assessment and Plan: No notes have been filed under this hospital service. Service: Hospitalist  Spontaneous pneumothorax has resolved.  Patient is cleared from pulmonology.  Patient has no symptoms.     Pain control - [employer] Controlled Substance Reporting System database was reviewed. and patient was instructed, not to drive, operate heavy machinery, perform activities at heights, swimming or participation in water activities or provide baby-sitting services while on Pain, Sleep and Anxiety Medications; until their outpatient Physician has advised to do so again. Also recommended to not to take more than prescribed Pain, Sleep and Anxiety Medications.   Consultants: Pulmonology Procedures performed: Chest X Disposition: Home Diet recommendation:  Discharge Diet Orders (From admission, onward)     Start     Ordered   04/15/21 0000  Diet - low sodium heart healthy        04/15/21 1623   04/15/21 0000  Diet Carb Modified        04/15/21 1623           Carb modified diet  DISCHARGE MEDICATION: Allergies as of 04/15/2021       Reactions   Albuterol Other (See Comments)   Patient has  had episode of atrial fib following administration of albuterol (tolerates Xopenex well)        Medication List     STOP taking these medications    amiodarone 200 MG tablet Commonly known as: PACERONE   dexamethasone 4 MG tablet Commonly known as: DECADRON       TAKE these  medications    acetaminophen 500 MG tablet Commonly known as: TYLENOL Take 1,000 mg by mouth every 6 (six) hours as needed for mild pain.   apixaban 5 MG Tabs tablet Commonly known as: Eliquis Take 1 tablet (5 mg total) by mouth 2 (two) times daily.   levalbuterol 0.63 MG/3ML nebulizer solution Commonly known as: XOPENEX Take 3 mLs (0.63 mg total) by nebulization every 8 (eight) hours as needed for wheezing or shortness of breath.   LORazepam 0.5 MG tablet Commonly known as: Ativan Take 1 tablet (0.5 mg total) by mouth every 6 (six) hours as needed (Nausea or vomiting).   metoprolol succinate 25 MG 24 hr tablet Commonly known as: Toprol XL Take one tablet by mouth daily What changed:  how much to take when to take this additional instructions   ondansetron 8 MG tablet Commonly known as: Zofran Take 1 tablet (8 mg total) by mouth 2 (two) times daily as needed for refractory nausea / vomiting.   PARoxetine 10 MG tablet Commonly known as: PAXIL Take 10 mg by mouth at bedtime.   predniSONE 5 MG tablet Commonly known as: DELTASONE Take 1 tablet (5 mg total) by mouth daily.   PriLOSEC OTC 20 MG tablet Generic drug: omeprazole Take 20 mg by mouth at bedtime.   prochlorperazine 10 MG tablet Commonly known as: COMPAZINE Take 1 tablet (10 mg total) by mouth every 6 (six) hours as needed (Nausea or vomiting).   umeclidinium-vilanterol 62.5-25 MCG/ACT Aepb Commonly known as: ANORO ELLIPTA Inhale 1 puff into the lungs daily.        Follow-up Information     [name], MD Follow up in 1 week(s).   Specialty: Internal Medicine Contact information: [place] [address] [phone]         Sueanne Margarita, MD .   Specialty: Cardiology Contact information: [phone] [address] [place] [address] [phone]                 Discharge Exam: [name] Weights   04/15/21 0130  Weight: 105 kg   General exam: Appears  comfortable, not in any acute distress. Respiratory system: CTA bilaterally, no wheezing, no crackles. Cardiovascular system: S1-S2 heard, regular rate and rhythm, no murmur. Gastrointestinal system:  Abdomen is soft, non tender, non distended, BS+ Central nervous system: Alert and oriented x 3, no focal neurological deficits. Extremities: no edema, no cyanosis, no clubbing. Psychiatry: Mood, insight, judgment normal.   Condition at discharge: good  The results of significant diagnostics from this hospitalization (including imaging, microbiology, ancillary and laboratory) are listed below for reference.   Imaging Studies: DG Chest 1 View  Result Date: 04/01/2021 CLINICAL DATA:  Pneumothorax EXAM: CHEST  1 VIEW COMPARISON:  Radiograph 03/31/2021 FINDINGS: Unchanged cardiomediastinal silhouette. Unchanged right perihilar opacity with cystic lucencies in the right midlung. There are unchanged interstitial and lower lung airspace opacities in the right lung. There are unchanged peripheral predominant interstitial opacities in the left lung with some more centrally faint opacity. Unchanged position of right basilar chest tube. Unchanged small residual right lateral pneumothorax. Bones are unchanged. IMPRESSION: Unchanged small residual  right lateral pneumothorax with basilar chest tube in place. Unchanged parenchymal opacities bilaterally. Electronically Signed   By: Maurine Simmering M.D.   On: 04/01/2021 08:06   DG Chest 1 View  Result Date: 03/31/2021 CLINICAL DATA:  Pneumothorax. EXAM: CHEST  1 VIEW COMPARISON:  March 30, 2021. FINDINGS: Stable cardiomediastinal silhouette. Stable right midlung and left basilar opacities are noted. Stable small pneumothorax is noted laterally in the right hemithorax. Right-sided chest tube is unchanged. Bony thorax is unremarkable. IMPRESSION: Stable position of right-sided chest tube with stable small right lateral pneumothorax. Stable bilateral lung opacities are  noted. Electronically Signed   By: Marijo Conception M.D.   On: 03/31/2021 08:15   DG Chest 1 View  Result Date: 03/30/2021 CLINICAL DATA:  Status post chest tube insertion. EXAM: CHEST  1 VIEW COMPARISON:  Chest x-ray 03/30/2021 5:35 p.m. FINDINGS: The heart and mediastinal contours are unchanged. Aortic calcification. Low lung volume. Bilateral mid to lower lung zone interstitial and airspace opacities. No pulmonary edema. Interval placement of a right chest tube with pigtail overlying the right costophrenic angle. Interval decrease in size of a now small right pneumothorax. No pneumothorax. No acute osseous abnormality. IMPRESSION: 1. Interval placement of a right chest tube with pigtail overlying the right costophrenic angle. Interval decrease in size of a now small right pneumothorax. 2. Bilateral mid to lower lung zone interstitial and airspace opacities. 3.  Aortic Atherosclerosis (ICD10-I70.0). Electronically Signed   By: Iven Finn M.D.   On: 03/30/2021 19:03   DG Chest 2 View  Result Date: 04/14/2021 CLINICAL DATA:  Chest tube removal, history of pneumothorax, lung cancer EXAM: CHEST - 2 VIEW COMPARISON:  04/14/2021 at 5:55 p.m. FINDINGS: Frontal and lateral views of the chest demonstrate interval removal of the right pigtail drainage catheter seen previously. Extensive subcutaneous gas is again seen throughout the right chest wall. There is evidence of a small recurrent right-sided pneumothorax, volume estimated approximately 10%. Slight increased consolidation in the right perihilar region consistent with atelectasis superimposed upon prior airspace disease. Stable patchy consolidation within the left lung base. No pleural effusion. No acute bony abnormalities. IMPRESSION: 1. Small recurrent right pneumothorax after chest tube removal, volume estimated 10%. 2. Extensive subcutaneous gas throughout the right chest wall, stable. 3. Bilateral lung consolidation, slightly increased in the right  perihilar region. This likely reflects atelectasis superimposed upon chronic airspace disease. Critical Value/emergent results were called by telephone at the time of interpretation on 04/14/2021 at 7:01 pm to provider [facility] , who verbally acknowledged these results. Electronically Signed   By: Randa Ngo M.D.   On: 04/14/2021 19:05   DG Chest 2 View  Result Date: 04/14/2021 CLINICAL DATA:  Right pneumothorax EXAM: CHEST - 2 VIEW COMPARISON:  Previous studies including the examination done earlier today FINDINGS: Right chest tube is noted with its tip in the lateral aspect of right upper lung fields. There is interval resolution of small right apical pneumothorax. Subcutaneous emphysema is seen in the right chest wall and right lower neck. There is possible bulla or cavitary pneumonia in right parahilar region which has not changed significantly. There are linear densities in the lower lung fields with interval improvement. There are no new focal infiltrates. There is blunting of both lateral CP angles. IMPRESSION: There is interval resolution of right pneumothorax. There is improvement in aeration of lower lung fields suggesting resolving atelectasis/pneumonia. Electronically Signed   By: Elmer Picker M.D.   On: 04/14/2021 18:08   DG  Chest 2 View  Result Date: 04/14/2021 CLINICAL DATA:  Shortness of breath. EXAM: CHEST - 2 VIEW COMPARISON:  04/02/2021 FINDINGS: Two views of the chest demonstrated a right pneumothorax measuring at least 10-15% size. Difficult to evaluate the pneumothorax due to a large amount of subcutaneous gas in the right chest. Subcutaneous gas is new. However, the pneumothorax is likely enlarged since 04/02/2021. There is a pigtail chest tube which appears to be in the anterior right chest based on the lateral view. It is possible that a side hole could be outside of the chest. Again noted are patchy densities in the right perihilar region. Coarse interstitial  densities in the periphery of the left lung are again noted. Trachea is midline. Heart size is stable. IMPRESSION: 1. Enlargement of the right pneumothorax measuring at least 10-15% in size but limited evaluation due to a large amount of subcutaneous gas. The subcutaneous gas is new. 2. Right chest tube is present and likely within the anterior right chest. Only a small portion of the tube appears to be within the chest and it is possible a side hole could be outside of the pleural space. 3. Persistent densities in the right perihilar region. 4. Chronic changes in the periphery of the left lung. Electronically Signed   By: Markus Daft M.D.   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 Evaluate treatment response. Shortness of breath EXAM: CT CHEST, ABDOMEN, AND PELVIS WITH CONTRAST TECHNIQUE: Multidetector CT imaging of the chest, abdomen and pelvis was performed following the standard protocol during bolus administration of intravenous contrast. RADIATION DOSE REDUCTION: This exam was performed according to the departmental dose-optimization program which includes automated exposure control, adjustment of the mA and/or kV according to patient size and/or use of iterative reconstruction technique. CONTRAST:  19mL ISOVUE-300 IOPAMIDOL (ISOVUE-300) INJECTION 61% COMPARISON:  12/31/2020 chest CT.  Abdominopelvic CT 07/18/2020 FINDINGS: CT CHEST FINDINGS Cardiovascular: Aortic atherosclerosis. Normal heart size with lipomatous hypertrophy of the interatrial septum. Lad and left circumflex coronary artery calcification. Mediastinum/Nodes: No supraclavicular adenopathy. No mediastinal or hilar adenopathy. Lungs/Pleura: No pleural fluid. Development of a large right-sided pneumothorax. The previously described pneumomediastinum has resolved. Centrilobular emphysema. The right lung is poorly evaluated secondary to  collapse. Suspect underlying fibrosis, with architectural distortion and scattered ground-glass. Right upper lobe or superior segment lower lobe dominant bulla/blebs on 63/4. Underlying centrilobular emphysema. 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 Musculoskeletal: No acute osseous abnormality. CT ABDOMEN PELVIS FINDINGS Hepatobiliary: Normal liver. 1.8 cm stone in the gallbladder neck. No acute cholecystitis or biliary duct dilatation. Pancreas: Pancreatic fatty replacement throughout the body and head. No duct dilatation or acute inflammation. Spleen: Minimal splenic hypoattenuation along the capsule on 62/2 likely represents sequelae of splenic infarct. Adrenals/Urinary Tract: Normal left adrenal gland. Bilobed right adrenal mass measures 4.9 x 3.1 cm on 58/2 versus 5.8 x 3.0 cm on the prior exam. Too small to characterize interpolar left renal lesion. Normal right kidney. No hydronephrosis. Normal urinary bladder. Stomach/Bowel: Normal stomach, without wall thickening. Normal colon, appendix, and terminal ileum. Normal small bowel. Vascular/Lymphatic: Aortic atherosclerosis. No abdominopelvic adenopathy. Reproductive: Normal prostate. Other: Small fat containing left inguinal hernia. No significant free fluid. No evidence of omental or peritoneal disease. Tiny fat containing ventral abdominal wall hernia. Musculoskeletal: L2 mild compression deformity or Schmorl's node, similar. IMPRESSION: CT CHEST IMPRESSION 1. Large right-sided  pneumothorax. 2. Suboptimal evaluation of the right lung. Suspect treatment related fibrosis, as before. 3. No thoracic adenopathy. 4. Coronary artery atherosclerosis. Aortic Atherosclerosis (ICD10-I70.0). Emphysema (ICD10-J43.9). CT ABDOMEN AND PELVIS IMPRESSION 1. 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 2. No new or progressive abdominopelvic metastasis. 3. Cholelithiasis. 4. Sequelae of  splenic infarct. A call to the emergency room provider at [facility] (where patient is currently) is pending as of 6:08 p.m. Electronically Signed   By: Abigail Miyamoto M.D.   On: 03/30/2021 18:08   DG Chest Port 1 View  Result Date: 04/15/2021 CLINICAL DATA:  Right pneumothorax EXAM: PORTABLE CHEST 1 VIEW COMPARISON:  Previous studies including the examination done earlier today FINDINGS: There is interval decrease in small pneumothorax in the lateral aspect of right mid lung fields. There is no demonstrable apical pneumothorax. There are linear densities in the right parahilar region along with emphysematous bullae which have not changed significantly. There is blunting of right lateral CP angle. There is subcutaneous emphysema. IMPRESSION: There is interval decrease in loculated pneumothorax in the lateral aspect of right mid lung fields. There is no demonstrable apical pneumothorax. Linear densities are seen in the right parahilar region suggesting scarring or subsegmental atelectasis. Bullae are noted in the right parahilar region. Electronically Signed   By: Elmer Picker M.D.   On: 04/15/2021 14:34   DG CHEST PORT 1 VIEW  Result Date: 04/15/2021 CLINICAL DATA:  pneumothorax EXAM: PORTABLE CHEST - 1 VIEW COMPARISON:  the previous day's study FINDINGS: Small residual lateral right pneumothorax, slightly less conspicuous than on prior study. Moderate right subcutaneous emphysema as before. Stable right perihilar consolidation and cystic changes in the right mid lung. Mild left infrahilar airspace opacities. Heart size and mediastinal contours are otherwisewithin normal limits. No  definite pleural effusion. Visualized bones unremarkable. IMPRESSION: Stable small right lateral pneumothorax, with extensive subcutaneous emphysema. Electronically Signed   By: Lucrezia Europe M.D.   On: 04/15/2021 08:11   DG CHEST PORT 1 VIEW  Result Date: 04/02/2021 CLINICAL DATA:  Status post chest tube placement.  EXAM: PORTABLE CHEST 1 VIEW COMPARISON:  04/02/21 at 9:02 a.m. FINDINGS: There is been interval placement a right-sided chest tube with significant reduction in volume of right-sided pneumothorax. The right pneumothorax measures approximately 4 mm measured over the lateral right lung. This is compared with 3.9 cm earlier today. Similar appearance of the right mid lung cavitary mass. There is diffuse peripheral and basilar predominant reticular interstitial opacities within the left lung, unchanged. IMPRESSION: 1. Interval placement of right-sided chest tube with significant reduction in volume of right-sided pneumothorax. 2. No change in right mid lung cavitary mass. Electronically Signed   By: Kerby Moors M.D.   On: 04/02/2021 12:26   DG CHEST PORT 1 VIEW  Result Date: 04/02/2021 CLINICAL DATA:  Shortness of breath, pneumothorax EXAM: PORTABLE CHEST 1 VIEW COMPARISON:  04/01/2021 FINDINGS: Cardiomegaly. Interval removal of a previously seen right-sided pigtail chest tube. Large right pneumothorax, approximately 70% volume, increased compared to prior examination. Cavitary lesion of the right midlung again noted. Diffuse interstitial opacity of the left lung. The visualized skeletal structures are unremarkable. IMPRESSION: 1. Large right pneumothorax, approximately 70% volume, increased compared to prior examination following removal of chest tube. 2. Cavitary lesion of the right midlung again noted. 3. Diffuse interstitial opacity of the left lung, likely edema in the setting of cardiomegaly. 4. Cardiomegaly. These results will be called to the ordering clinician or representative by the Radiologist Assistant, and communication  documented in the PACS or [employer]. Electronically Signed   By: Delanna Ahmadi M.D.   On: 04/02/2021 09:17   DG Chest Port 1 View  Result Date: 03/30/2021 CLINICAL DATA:  Pneumothorax EXAM: PORTABLE CHEST 1 VIEW COMPARISON:  01/12/2021, CT 12/31/2020, chest x-ray 05/12/2020,  10/10/2020 FINDINGS: Large cavitary lesion in the right mid lung. Underlying coarse interstitial opacity. Normal cardiac size with aortic atherosclerosis. Right-sided pneumothorax approaches 50%. Slight midline shift to the left. IMPRESSION: 1. Large right-sided pneumothorax approaches 50%. 2. There is underlying interstitial disease. Cavitary lesion in the right mid lung as noted on previous exam Critical Value/emergent results were called by telephone at the time of interpretation on 03/30/2021 at 5:56 pm to provider [facility] , who verbally acknowledged these results. Electronically Signed   By: Donavan Foil M.D.   On: 03/30/2021 17:56    Microbiology: Results for orders placed or performed during the hospital encounter of 04/14/21  Resp Panel by RT-PCR (Flu A&B, Covid) Nasopharyngeal Swab     Status: None   Collection Time: 04/14/21  5:56 PM   Specimen: Nasopharyngeal Swab; Nasopharyngeal(NP) swabs in vial transport medium  Result Value Ref Range Status   SARS Coronavirus 2 by RT PCR NEGATIVE NEGATIVE Final    Comment: (NOTE) SARS-CoV-2 target nucleic acids are NOT DETECTED.  The SARS-CoV-2 RNA is generally detectable in upper respiratory specimens during the acute phase of infection. The lowest concentration of SARS-CoV-2 viral copies this assay can detect is 138 copies/mL. A negative result does not preclude SARS-Cov-2 infection and should not be used as the sole basis for treatment or other patient management decisions. A negative result may occur with  improper specimen collection/handling, submission of specimen other than nasopharyngeal swab, presence of viral mutation(s) within the areas targeted by this assay, and inadequate number of viral copies(<138 copies/mL). A negative result must be combined with clinical observations, patient history, and epidemiological information. The expected result is Negative.  Fact Sheet for Patients:   EntrepreneurPulse.com.au  Fact Sheet for Healthcare Providers:  IncredibleEmployment.be  This test is no t yet approved or cleared by the [place] FDA and  has been authorized for detection and/or diagnosis of SARS-CoV-2 by FDA under an Emergency Use Authorization (EUA). This EUA will remain  in effect (meaning this test can be used) for the duration of the COVID-19 declaration under Section 564(b)(1) of the Act, 21 U.S.C.section 360bbb-3(b)(1), unless the authorization is terminated  or revoked sooner.       Influenza A by PCR NEGATIVE NEGATIVE Final   Influenza B by PCR NEGATIVE NEGATIVE Final    Comment: (NOTE) The Xpert Xpress SARS-CoV-2/FLU/RSV plus assay is intended as an aid in the diagnosis of influenza from Nasopharyngeal swab specimens and should not be used as a sole basis for treatment. Nasal washings and aspirates are unacceptable for Xpert Xpress SARS-CoV-2/FLU/RSV testing.  Fact Sheet for Patients: EntrepreneurPulse.com.au  Fact Sheet for Healthcare Providers: IncredibleEmployment.be  This test is not yet approved or cleared by the [place] FDA and has been authorized for detection and/or diagnosis of SARS-CoV-2 by FDA under an Emergency Use Authorization (EUA). This EUA will remain in effect (meaning this test can be used) for the duration of the COVID-19 declaration under Section 564(b)(1) of the Act, 21 U.S.C. section 360bbb-3(b)(1), unless the authorization is terminated or revoked.  Performed at [facility], [place] [address] [place], [place] [address]     Labs: CBC: Recent Labs  Lab 04/14/21 1756 04/15/21  0338  WBC 8.0 6.8  NEUTROABS 4.6  --   HGB 11.3* 10.2*  HCT 34.7* 32.3*  MCV 87.4 89.5  PLT 455* 941   Basic Metabolic Panel: Recent Labs  Lab 04/14/21 1756  NA 136  K 4.2  CL 102  CO2 25  GLUCOSE 105*  BUN 9  CREATININE 1.05   CALCIUM 8.6*   Liver Function Tests: Recent Labs  Lab 04/14/21 1756  AST 13*  ALT 11  ALKPHOS 65  BILITOT 0.6  PROT 7.1  ALBUMIN 3.4*   CBG: No results for input(s): GLUCAP in the last 168 hours.  Discharge time spent: greater than 30 minutes.  Signed: Shawna Clamp, MD [facility] 04/15/2021

## 2021-04-15 NOTE — Plan of Care (Signed)
DC paperwork reviewed with pt. Questions answered. PIV removed. ? ? ?Problem: Education: ?Goal: Knowledge of General Education information will improve ?Description: Including pain rating scale, medication(s)/side effects and non-pharmacologic comfort measures ?04/15/2021 1635 by Cassandria Anger, RN ?Outcome: Completed/Met ?04/15/2021 1058 by Cassandria Anger, RN ?Outcome: Progressing ?  ?Problem: Health Behavior/Discharge Planning: ?Goal: Ability to manage health-related needs will improve ?04/15/2021 1635 by Cassandria Anger, RN ?Outcome: Completed/Met ?04/15/2021 1058 by Cassandria Anger, RN ?Outcome: Progressing ?  ?Problem: Clinical Measurements: ?Goal: Ability to maintain clinical measurements within normal limits will improve ?04/15/2021 1635 by Cassandria Anger, RN ?Outcome: Completed/Met ?04/15/2021 1058 by Cassandria Anger, RN ?Outcome: Progressing ?Goal: Will remain free from infection ?04/15/2021 1635 by Cassandria Anger, RN ?Outcome: Completed/Met ?04/15/2021 1058 by Cassandria Anger, RN ?Outcome: Progressing ?Goal: Diagnostic test results will improve ?04/15/2021 1635 by Cassandria Anger, RN ?Outcome: Completed/Met ?04/15/2021 1058 by Cassandria Anger, RN ?Outcome: Progressing ?Goal: Respiratory complications will improve ?04/15/2021 1635 by Cassandria Anger, RN ?Outcome: Completed/Met ?04/15/2021 1058 by Cassandria Anger, RN ?Outcome: Progressing ?Goal: Cardiovascular complication will be avoided ?04/15/2021 1635 by Cassandria Anger, RN ?Outcome: Completed/Met ?04/15/2021 1058 by Cassandria Anger, RN ?Outcome: Progressing ?  ?Problem: Activity: ?Goal: Risk for activity intolerance will decrease ?04/15/2021 1635 by Cassandria Anger, RN ?Outcome: Completed/Met ?04/15/2021 1058 by Cassandria Anger, RN ?Outcome: Progressing ?  ?Problem: Nutrition: ?Goal: Adequate nutrition will be maintained ?04/15/2021 1635 by Cassandria Anger, RN ?Outcome: Completed/Met ?04/15/2021 1058 by Cassandria Anger, RN ?Outcome: Progressing ?  ?Problem: Coping: ?Goal: Level of anxiety will decrease ?04/15/2021 1635 by  Cassandria Anger, RN ?Outcome: Completed/Met ?04/15/2021 1058 by Cassandria Anger, RN ?Outcome: Progressing ?  ?Problem: Elimination: ?Goal: Will not experience complications related to bowel motility ?04/15/2021 1635 by Cassandria Anger, RN ?Outcome: Completed/Met ?04/15/2021 1058 by Cassandria Anger, RN ?Outcome: Progressing ?Goal: Will not experience complications related to urinary retention ?04/15/2021 1635 by Cassandria Anger, RN ?Outcome: Completed/Met ?04/15/2021 1058 by Cassandria Anger, RN ?Outcome: Progressing ?  ?Problem: Pain Managment: ?Goal: General experience of comfort will improve ?04/15/2021 1635 by Cassandria Anger, RN ?Outcome: Completed/Met ?04/15/2021 1058 by Cassandria Anger, RN ?Outcome: Progressing ?  ?Problem: Safety: ?Goal: Ability to remain free from injury will improve ?04/15/2021 1635 by Cassandria Anger, RN ?Outcome: Completed/Met ?04/15/2021 1058 by Cassandria Anger, RN ?Outcome: Progressing ?  ?Problem: Skin Integrity: ?Goal: Risk for impaired skin integrity will decrease ?04/15/2021 1635 by Cassandria Anger, RN ?Outcome: Completed/Met ?04/15/2021 1058 by Cassandria Anger, RN ?Outcome: Progressing ?  ?

## 2021-04-15 NOTE — Progress Notes (Signed)
? ?NAME:  John [name], MRN:  [id number], DOB:  Dec 10, 1960, LOS: 0 ?ADMISSION DATE:  04/14/2021 CONSULTATION DATE:  04/14/2021 ?REFERRING MD:  John [name] - EDP CHIEF COMPLAINT: Dyspnea, known R PTX ? ?History of Present Illness:  ?61 year old man who presented to [facility] ED 2/28 from [facility] for worsening R pneumothorax in the setting of chest tube dysfunction. 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 Recently admitted 2/13 - 2/17 for spontaneous R PTX requiring pigtail insertion.  ? ?Patient initially presented to the [facility] pulmonary office today for follow-up with John [name] after he noticed increased swelling of his RUE/R chest around his CT site/tracking into his R arm. He also reported ongoing shortness of breath, which is baseline for him. In-office CXR demonstrated worsening of his R apical pneumothorax and significant subcutaneous air in the pectoral region/RUE.  He was subsequently sent to [facility] ED for chest tube troubleshooting, including placement back to suction via Pleur-evac. ED CXR demonstrated improvement of PTX on suction. ? ?PCCM consulted for CT management. ? ?Pertinent Medical History:  ? ?Past Medical History:  ?Diagnosis Date  ? Atrial fibrillation with RVR ([place]) 05/12/2020  ? Dyspnea   ? Family history of adverse reaction to anesthesia   ? brother with seizures had episode under anesthesia.  Patient has seizures as well.  ? GERD (gastroesophageal reflux disease)   ? History of radiation therapy 04/24/2020-05/16/2020  ? IMRT to right lung     John [name]  ? PAF (paroxysmal atrial fibrillation) ([place])   ? CHADS2VSAC score 0  ? Rheumatoid aortitis   ? Sepsis ([place]) 10/06/2020  ? Squamous cell lung cancer ([place])   ? ?Significant Hospital Events: ?Including procedures, antibiotic start and stop dates in addition to  other pertinent events   ?2/13 - Admitted to [facility] with spontaneous R PTX. R pigtail placed. ?2/16 - Pigtail removed; unfortunately had recurrence of PTX (70%) requiring pigtail replacement. ?2/17 - Pigtail placed on waterseal with continued good lung expansion. Transitioned to [facility] for discharge home. ?2/23 - Office f/u with John [name]; pigtail to waterseal with 357mL gold-colored fluid in [facility]. Plan for f/u and clamping if no air leak/possible CT removal. ?2/28 - Presented to [facility] with SOB and increased RUE/R chest swelling; office CXR with increased apical PTX and subcutaneous air (tracking down arm). Repeat ED CXR with improved PTX (on suction). PCCM consulted for CT troubleshooting. Pigtail removed. Repeat CXR pending. ? ?Interim History / Subjective:  ?Feels good.  CXR improved some.  Sitting up on edge of bed, no distress. ? ?Objective:  ?Blood pressure 109/83, pulse 66, temperature 97.8 ?F (36.6 ?C), temperature source Oral, resp. rate 16, height 5\' 11"  (1.803 m), weight 105 kg, SpO2 95 %. ?   ?   ? ?Intake/Output Summary (Last 24 hours) at 04/15/2021 0925 ?Last data filed at 04/15/2021 0600 ?Gross per 24 hour  ?Intake 480 ml  ?Output 1125 ml  ?Net -645 ml  ? ?Filed Weights  ? 04/15/21 0130  ?Weight: 105 kg  ? ? ?Physical Examination: ?General: Adult male, sitting up on edge of bed, in NAD. ?Neuro: A&O x 3, no deficits. ?HEENT: [place]/AT. Sclerae anicteric. EOMI. ?Cardiovascular: RRR, no M/R/G.  ?Lungs: Respirations even and unlabored.  CTA bilaterally, No W/R/R. ?Abdomen: BS x 4, soft, NT/ND.  ?Musculoskeletal: No gross deformities, no edema.  ?  Skin: Intact, warm, no rashes. ? ? ?Assessment & Plan:  ?John [name] is seen in consultation at the request of John. Armandina [name] ([place]) for further evaluation and management of chest tube dysfunction/R pneumothorax. ? ?On assessment at bedside, John [name] is visibly uncomfortable secondary to pain related to his pigtail catheter in his right chest.  Pigtail with  black markers visible on catheter portion exterior to skin, suggesting dislodgment.  He also has significant right upper extremity subcutaneous emphysema from right axilla to right forearm.  Discussed with John. Elsworth [name], patient/wife at bedside; options include leaving chest tube in place to suction overnight with repeat CXR in AM or removal of chest tube with repeat CXR this evening to ensure no recurrence of PTX.  We do not feel that the existing pigtail in its current position is benefiting patient at this time. ? ?Pigtail catheter removed at bedside by myself with John. Elsworth [name] present.  Patient tolerated procedure well.  No significant change in oxygen saturations or oxygen requirements post procedure. ? ?Persistent R pneumothorax - had pigtail chest tube in place; however, it appeared retracted and almost dislodged with black markers of tube exterior to the skin. This was subsequently removed 2/28.  F/u CXR 3/1 looks stable and pt appears comfortable and subjectively feels better. ?Chronic hypoxemic respiratory failure ?History of stage IVa squamous cell lung CA  ?- Repeat CXR today at 1400 and if this is stable, ok to discharge home today. ?- Have asked our office to arrange for outpatient follow up Friday AM 3/3 with repeat CXR and have given pt these instructions which he is comfortable with.  Signs and symptoms warranting emergent ED visit were explained.\ ?- Continue supplemental O2 for goal sats greater than 90% ?- Bronchodilators as needed ?- Pulmonary hygiene ? ?Ok to d/c home if repeat CXR at 1400 stable.  Outpatient follow up for Friday 3/3 being arranged by our office. ? ? ?John Hora, PA - C ?[facility] ?For pager details, please see AMION or use Epic chat  ?After 1900, please call [facility] for cross coverage needs ?04/15/2021, 9:29 AM ? ?

## 2021-04-15 NOTE — Plan of Care (Signed)
MD reviewed plan for pt r/t chest imaging. ? ? ?Problem: Education: ?Goal: Knowledge of General Education information will improve ?Description: Including pain rating scale, medication(s)/side effects and non-pharmacologic comfort measures ?Outcome: Progressing ?  ?Problem: Health Behavior/Discharge Planning: ?Goal: Ability to manage health-related needs will improve ?Outcome: Progressing ?  ?Problem: Clinical Measurements: ?Goal: Ability to maintain clinical measurements within normal limits will improve ?Outcome: Progressing ?Goal: Will remain free from infection ?Outcome: Progressing ?Goal: Diagnostic test results will improve ?Outcome: Progressing ?Goal: Respiratory complications will improve ?Outcome: Progressing ?Goal: Cardiovascular complication will be avoided ?Outcome: Progressing ?  ?Problem: Activity: ?Goal: Risk for activity intolerance will decrease ?Outcome: Progressing ?  ?Problem: Nutrition: ?Goal: Adequate nutrition will be maintained ?Outcome: Progressing ?  ?Problem: Coping: ?Goal: Level of anxiety will decrease ?Outcome: Progressing ?  ?Problem: Elimination: ?Goal: Will not experience complications related to bowel motility ?Outcome: Progressing ?Goal: Will not experience complications related to urinary retention ?Outcome: Progressing ?  ?Problem: Pain Managment: ?Goal: General experience of comfort will improve ?Outcome: Progressing ?  ?Problem: Safety: ?Goal: Ability to remain free from injury will improve ?Outcome: Progressing ?  ?Problem: Skin Integrity: ?Goal: Risk for impaired skin integrity will decrease ?Outcome: Progressing ?  ?

## 2021-04-16 ENCOUNTER — Other Ambulatory Visit: Payer: Self-pay | Admitting: Adult Health

## 2021-04-16 DIAGNOSIS — J9312 Secondary spontaneous pneumothorax: Secondary | ICD-10-CM

## 2021-04-16 NOTE — Progress Notes (Signed)
Repeat CXR prior to OV.  ?

## 2021-04-17 ENCOUNTER — Other Ambulatory Visit: Payer: Self-pay

## 2021-04-17 ENCOUNTER — Telehealth: Payer: Self-pay | Admitting: Emergency Medicine

## 2021-04-17 ENCOUNTER — Ambulatory Visit: Payer: 59 | Admitting: Adult Health

## 2021-04-17 ENCOUNTER — Ambulatory Visit: Payer: 59 | Admitting: Hematology & Oncology

## 2021-04-17 ENCOUNTER — Ambulatory Visit: Payer: 59

## 2021-04-17 ENCOUNTER — Inpatient Hospital Stay: Payer: 59

## 2021-04-17 ENCOUNTER — Inpatient Hospital Stay (HOSPITAL_COMMUNITY)
Admission: EM | Admit: 2021-04-17 | Discharge: 2021-04-23 | DRG: 200 | Disposition: A | Discharge status: Home / Self Care | Payer: 59 | Source: Ambulatory Visit | Attending: Internal Medicine | Admitting: Internal Medicine

## 2021-04-17 ENCOUNTER — Observation Stay (HOSPITAL_COMMUNITY): Payer: 59

## 2021-04-17 ENCOUNTER — Ambulatory Visit (INDEPENDENT_AMBULATORY_CARE_PROVIDER_SITE_OTHER): Payer: 59

## 2021-04-17 ENCOUNTER — Encounter: Payer: Self-pay | Admitting: Adult Health

## 2021-04-17 DIAGNOSIS — J9383 Other pneumothorax: Secondary | ICD-10-CM | POA: Diagnosis not present

## 2021-04-17 DIAGNOSIS — J9312 Secondary spontaneous pneumothorax: Secondary | ICD-10-CM | POA: Diagnosis not present

## 2021-04-17 DIAGNOSIS — J9381 Chronic pneumothorax: Secondary | ICD-10-CM | POA: Diagnosis not present

## 2021-04-17 DIAGNOSIS — D649 Anemia, unspecified: Secondary | ICD-10-CM | POA: Diagnosis not present

## 2021-04-17 DIAGNOSIS — K219 Gastro-esophageal reflux disease without esophagitis: Secondary | ICD-10-CM | POA: Diagnosis present

## 2021-04-17 DIAGNOSIS — Z20822 Contact with and (suspected) exposure to covid-19: Secondary | ICD-10-CM | POA: Diagnosis present

## 2021-04-17 DIAGNOSIS — Z7901 Long term (current) use of anticoagulants: Secondary | ICD-10-CM

## 2021-04-17 DIAGNOSIS — I4891 Unspecified atrial fibrillation: Secondary | ICD-10-CM

## 2021-04-17 DIAGNOSIS — J939 Pneumothorax, unspecified: Secondary | ICD-10-CM | POA: Diagnosis not present

## 2021-04-17 DIAGNOSIS — J9811 Atelectasis: Secondary | ICD-10-CM | POA: Diagnosis present

## 2021-04-17 DIAGNOSIS — J449 Chronic obstructive pulmonary disease, unspecified: Secondary | ICD-10-CM | POA: Diagnosis present

## 2021-04-17 DIAGNOSIS — D638 Anemia in other chronic diseases classified elsewhere: Secondary | ICD-10-CM | POA: Diagnosis present

## 2021-04-17 DIAGNOSIS — Z888 Allergy status to other drugs, medicaments and biological substances status: Secondary | ICD-10-CM

## 2021-04-17 DIAGNOSIS — J9600 Acute respiratory failure, unspecified whether with hypoxia or hypercapnia: Secondary | ICD-10-CM

## 2021-04-17 DIAGNOSIS — Z9221 Personal history of antineoplastic chemotherapy: Secondary | ICD-10-CM

## 2021-04-17 DIAGNOSIS — M0530 Rheumatoid heart disease with rheumatoid arthritis of unspecified site: Secondary | ICD-10-CM | POA: Diagnosis present

## 2021-04-17 DIAGNOSIS — F419 Anxiety disorder, unspecified: Secondary | ICD-10-CM

## 2021-04-17 DIAGNOSIS — C7971 Secondary malignant neoplasm of right adrenal gland: Secondary | ICD-10-CM | POA: Diagnosis present

## 2021-04-17 DIAGNOSIS — Z923 Personal history of irradiation: Secondary | ICD-10-CM

## 2021-04-17 DIAGNOSIS — I480 Paroxysmal atrial fibrillation: Secondary | ICD-10-CM | POA: Diagnosis present

## 2021-04-17 DIAGNOSIS — Z8249 Family history of ischemic heart disease and other diseases of the circulatory system: Secondary | ICD-10-CM

## 2021-04-17 DIAGNOSIS — Z87891 Personal history of nicotine dependence: Secondary | ICD-10-CM

## 2021-04-17 DIAGNOSIS — C3490 Malignant neoplasm of unspecified part of unspecified bronchus or lung: Secondary | ICD-10-CM | POA: Diagnosis present

## 2021-04-17 DIAGNOSIS — Z79899 Other long term (current) drug therapy: Secondary | ICD-10-CM

## 2021-04-17 DIAGNOSIS — I4819 Other persistent atrial fibrillation: Secondary | ICD-10-CM | POA: Diagnosis present

## 2021-04-17 DIAGNOSIS — C3491 Malignant neoplasm of unspecified part of right bronchus or lung: Secondary | ICD-10-CM | POA: Diagnosis present

## 2021-04-17 DIAGNOSIS — Z7952 Long term (current) use of systemic steroids: Secondary | ICD-10-CM

## 2021-04-17 DIAGNOSIS — Z85118 Personal history of other malignant neoplasm of bronchus and lung: Secondary | ICD-10-CM

## 2021-04-17 LAB — CBC WITH DIFFERENTIAL/PLATELET
Abs Immature Granulocytes: 0.02 10*3/uL (ref 0.00–0.07)
Basophils Absolute: 0 10*3/uL (ref 0.0–0.1)
Basophils Relative: 1 %
Eosinophils Absolute: 1 10*3/uL — ABNORMAL HIGH (ref 0.0–0.5)
Eosinophils Relative: 15 %
HCT: 33.8 % — ABNORMAL LOW (ref 39.0–52.0)
Hemoglobin: 10.9 g/dL — ABNORMAL LOW (ref 13.0–17.0)
Immature Granulocytes: 0 %
Lymphocytes Relative: 11 %
Lymphs Abs: 0.7 10*3/uL (ref 0.7–4.0)
MCH: 28.9 pg (ref 26.0–34.0)
MCHC: 32.2 g/dL (ref 30.0–36.0)
MCV: 89.7 fL (ref 80.0–100.0)
Monocytes Absolute: 0.6 10*3/uL (ref 0.1–1.0)
Monocytes Relative: 10 %
Neutro Abs: 4 10*3/uL (ref 1.7–7.7)
Neutrophils Relative %: 63 %
Platelets: 321 10*3/uL (ref 150–400)
RBC: 3.77 MIL/uL — ABNORMAL LOW (ref 4.22–5.81)
RDW: 16 % — ABNORMAL HIGH (ref 11.5–15.5)
WBC: 6.3 10*3/uL (ref 4.0–10.5)
nRBC: 0 % (ref 0.0–0.2)

## 2021-04-17 LAB — SEDIMENTATION RATE: Sed Rate: 77 mm/hr — ABNORMAL HIGH (ref 0–16)

## 2021-04-17 LAB — BASIC METABOLIC PANEL
Anion gap: 7 (ref 5–15)
BUN: 9 mg/dL (ref 6–20)
CO2: 27 mmol/L (ref 22–32)
Calcium: 8.2 mg/dL — ABNORMAL LOW (ref 8.9–10.3)
Chloride: 101 mmol/L (ref 98–111)
Creatinine, Ser: 1.15 mg/dL (ref 0.61–1.24)
GFR, Estimated: >60 mL/min (ref >60)
Glucose, Bld: 107 mg/dL — ABNORMAL HIGH (ref 70–99)
Potassium: 4.2 mmol/L (ref 3.5–5.1)
Sodium: 135 mmol/L (ref 135–145)

## 2021-04-17 LAB — RESP PANEL BY RT-PCR (FLU A&B, COVID) ARPGX2
Influenza A by PCR: NEGATIVE
Influenza B by PCR: NEGATIVE
SARS Coronavirus 2 by RT PCR: NEGATIVE

## 2021-04-17 MED ORDER — ACETAMINOPHEN 650 MG RE SUPP: 650.0000 mg | Freq: Four times a day (QID) | RECTAL | Status: DC | PRN

## 2021-04-17 MED ORDER — HYDROCODONE-ACETAMINOPHEN 5-325 MG PO TABS: 1.0000 | ORAL_TABLET | ORAL | Status: DC | PRN

## 2021-04-17 MED ORDER — ONDANSETRON HCL 4 MG PO TABS: 4.0000 mg | ORAL_TABLET | Freq: Four times a day (QID) | ORAL | Status: DC | PRN

## 2021-04-17 MED ORDER — LEVALBUTEROL HCL 0.63 MG/3ML IN NEBU: 0.6300 mg | INHALATION_SOLUTION | Freq: Four times a day (QID) | RESPIRATORY_TRACT | Status: DC | PRN

## 2021-04-17 MED ORDER — MORPHINE SULFATE (PF) 2 MG/ML IV SOLN: 2.0000 mg | INTRAVENOUS | Status: DC | PRN

## 2021-04-17 MED ORDER — ONDANSETRON HCL 4 MG/2ML IJ SOLN: 4.0000 mg | Freq: Four times a day (QID) | INTRAMUSCULAR | Status: DC | PRN

## 2021-04-17 MED ORDER — ACETAMINOPHEN 325 MG PO TABS: 650.0000 mg | ORAL_TABLET | Freq: Four times a day (QID) | ORAL | Status: DC | PRN

## 2021-04-17 NOTE — H&P (Signed)
?History and Physical  ? ? ?PatientBernardo [name] SEG:[id number] DOB: Apr 24, 1960 ?DOA: 04/17/2021 ?DOS: the patient was seen and examined on 04/17/2021 ?PCP: [name], MD  ?Patient coming from: Home ? ?Chief Complaint:  ?Chief Complaint  ?Patient presents with  ? partial lung collapse  ? ? ?HPI: John [name] is a 61 y.o. male with medical history significant of a fib on anticoagulation, S4 SCC of right lung, anxiety, recurrent PTX. Presenting with recurrent PTX. He has had several recent visits for PTX. He has had a pigtail insertion and [facility]. He was following up in the outpt pulm office several days ago and found to have swelling of the RUE. It was found that his PTX had worsened d/t a CT malfunction. He was sent to the ED and monitored ON. He was discharged to home w/ pulm follow up for today. On his follow up for today, it was noted that his PTX had worsened. It was recommended that he come back to the ED for evaluation. He denies any chest pain. He reports his dyspnea is ok. He denies any other aggravating or alleviating factors.   ? ?Review of Systems: As mentioned in the history of present illness. All other systems reviewed and are negative. ?Past Medical History:  ?Diagnosis Date  ? Atrial fibrillation with RVR ([place]) 05/12/2020  ? Dyspnea   ? Family history of adverse reaction to anesthesia   ? brother with seizures had episode under anesthesia.  Patient has seizures as well.  ? GERD (gastroesophageal reflux disease)   ? History of radiation therapy 04/24/2020-05/16/2020  ? IMRT to right lung     Dr [name]  ? PAF (paroxysmal atrial fibrillation) ()   ? CHADS2VSAC score 0  ? Rheumatoid aortitis   ? Sepsis ([place]) 10/06/2020  ? Squamous cell lung cancer ([place])   ? ?Past Surgical History:  ?Procedure Laterality Date  ? BRONCHIAL BRUSHINGS  03/27/2020  ? Procedure: BRONCHIAL BRUSHINGS;  Surgeon: [name], MD;  Location: [facility] ENDOSCOPY;  Service: Cardiopulmonary;;  ? BUBBLE STUDY  10/13/2020  ?  Procedure: BUBBLE STUDY;  Surgeon: Pixie Casino, MD;  Location: [place];  Service: Cardiovascular;;  ? CARDIOVERSION N/A 10/13/2020  ? Procedure: CARDIOVERSION;  Surgeon: Pixie Casino, MD;  Location: [facility] ENDOSCOPY;  Service: Cardiovascular;  Laterality: N/A;  ? CATARACT EXTRACTION  2016  ? at [facility]  ? FINE NEEDLE ASPIRATION  03/27/2020  ? Procedure: FINE NEEDLE ASPIRATION;  Surgeon: [name], MD;  Location: [facility] ENDOSCOPY;  Service: Cardiopulmonary;;  ? HIP SURGERY Left   ? TEE WITHOUT CARDIOVERSION N/A 10/13/2020  ? Procedure: TRANSESOPHAGEAL ECHOCARDIOGRAM (TEE);  Surgeon: Pixie Casino, MD;  Location: [place];  Service: Cardiovascular;  Laterality: N/A;  ? VIDEO BRONCHOSCOPY WITH ENDOBRONCHIAL ULTRASOUND N/A 03/27/2020  ? Procedure: VIDEO BRONCHOSCOPY WITH ENDOBRONCHIAL ULTRASOUND;  Surgeon: [name], MD;  Location: [facility] ENDOSCOPY;  Service: Cardiopulmonary;  Laterality: N/A;  ? ?Social History:  reports that he quit smoking about 5 years ago. His smoking use included cigarettes. He has a 30.00 pack-year smoking history. He has never used smokeless tobacco. He reports that he does not currently use alcohol. He reports that he does not use drugs. ? ?Allergies  ?Allergen Reactions  ? Albuterol Other (See Comments)  ?  Patient has had episode of atrial fib following administration of albuterol (tolerates Xopenex well)  ? ? ?Family History  ?Problem Relation Age of Onset  ? Arrhythmia Mother   ?  has PPM  ? Heart disease Father   ?     Died at 42, started in his 63s, heart attacks, had PPM and ICD  ? ? ?Prior to Admission medications   ?Medication Sig Start Date End Date Taking? Authorizing Provider  ?acetaminophen (TYLENOL) 500 MG tablet Take 1,000 mg by mouth every 6 (six) hours as needed for mild pain.    [provider]  ?apixaban (ELIQUIS) 5 MG TABS tablet Take 1 tablet (5 mg total) by mouth 2 (two) times daily. 01/05/21   Fenton, Clint R, PA  ?levalbuterol (XOPENEX) 0.63  MG/3ML nebulizer solution Take 3 mLs (0.63 mg total) by nebulization every 8 (eight) hours as needed for wheezing or shortness of breath. 01/29/21   [name], MD  ?LORazepam (ATIVAN) 0.5 MG tablet Take 1 tablet (0.5 mg total) by mouth every 6 (six) hours as needed (Nausea or vomiting). 01/29/21   Volanda Napoleon, MD  ?metoprolol succinate (TOPROL XL) 25 MG 24 hr tablet Take one tablet by mouth daily ?Patient taking differently: 25 mg daily. 01/05/21   Fenton, Clint R, PA  ?ondansetron (ZOFRAN) 8 MG tablet Take 1 tablet (8 mg total) by mouth 2 (two) times daily as needed for refractory nausea / vomiting. 01/29/21   Volanda Napoleon, MD  ?PARoxetine (PAXIL) 10 MG tablet Take 10 mg by mouth at bedtime. 01/25/20   [provider]  ?predniSONE (DELTASONE) 5 MG tablet Take 1 tablet (5 mg total) by mouth daily. 03/13/21   Volanda Napoleon, MD  ?PRILOSEC OTC 20 MG tablet Take 20 mg by mouth at bedtime. 11/13/19   [provider]  ?prochlorperazine (COMPAZINE) 10 MG tablet Take 1 tablet (10 mg total) by mouth every 6 (six) hours as needed (Nausea or vomiting). 01/29/21   Volanda Napoleon, MD  ?umeclidinium-vilanterol [facility] ELLIPTA) 62.5-25 MCG/ACT AEPB Inhale 1 puff into the lungs daily. 01/29/21 04/29/21  [name], MD  ? ? ?Physical Exam: ?Vitals:  ? 04/17/21 1254 04/17/21 1308 04/17/21 1400 04/17/21 1430  ?BP: 125/78  109/76 107/74  ?Pulse: 79  72 71  ?Resp: 18  16 18   ?Temp: 98.1 ?F (36.7 ?C)     ?TempSrc: Oral     ?SpO2: 99% 100% 100% 95%  ? ?General: 61 y.o. male resting in bed in NAD ?Eyes: PERRL, normal sclera ?ENMT: Nares patent w/o discharge, orophaynx clear, dentition normal, ears w/o discharge/lesions/ulcers ?Neck: Supple, trachea midline ?Cardiovascular: RRR, +S1, S2, no m/g/r, equal pulses throughout ?Respiratory: right side insp wheeze, normal WOB on NRB ?GI: BS+, NDNT, no masses noted, no organomegaly noted ?MSK: No e/c/c ?Skin: subcutaneous emphysema right RUE/right  chest ?Neuro: A&O x 3, no focal deficits ?Psyc: Appropriate interaction and affect, calm/cooperative ? ? ?Data Reviewed: ? ?CXR: Increased RIGHT pneumothorax without mediastinal shift, estimated 20%.Increased pulmonary infiltrates since previous study. ? ?Assessment and Plan: ?No notes have been filed under this hospital service. ?Service: Hospitalist ?Recurrent PTX ?    - place in obs, progressive ?    - continue NRB ?    - seen by pulm; rec serial CXR and continuing NRB; watchful waiting ?    - patient has consented to CT placement if necessary; his primary concern is getting better prior to discharge ? ?Stage 4 squamous cell carcinoma of right lung ?    - follows w/ [name]; continue outpt follow up ? ?Anxiety ?    - continue home regimen when confirmed ? ?A fib ?    -  continue home regimen when confirmed ? ?Normocytic anemia ?    - no evidence of bleed, check iron studies ? ?Advance Care Planning:   Code Status: FULL ? ?Consults: PCCM ? ?Family Communication: w/ wife at bedside ? ?Severity of Illness: ?The appropriate patient status for this patient is OBSERVATION. Observation status is judged to be reasonable and necessary in order to provide the required intensity of service to ensure the patient's safety. The patient's presenting symptoms, physical exam findings, and initial radiographic and laboratory data in the context of their medical condition is felt to place them at decreased risk for further clinical deterioration. Furthermore, it is anticipated that the patient will be medically stable for discharge from the hospital within 2 midnights of admission.  ? ?Author: ?Jonnie Finner, DO ?04/17/2021 2:54 PM ? ?For on call review [email or web address].  ?

## 2021-04-17 NOTE — Telephone Encounter (Signed)
Received call report from [place] with [facility] on patient's cxr done on 3/3. ? ?IMPRESSION: ?Increased RIGHT pneumothorax without mediastinal shift, estimated ?20%. ?  ?Increased pulmonary infiltrates since previous study. ? ?Stated to Diane that TP was already aware of the results. Nothing further needed. ? ?

## 2021-04-17 NOTE — Assessment & Plan Note (Signed)
Recurrent right-sided pneumothorax-patient's had difficulty over the last 2 to 3 weeks with multiple hospitalizations and pigtail catheters.  Unfortunately on return visit today chest x-ray shows increased right-sided pneumothorax at 20%.  Patient is high risk for decompensation.  He has underlying stage IVa lung cancer, oxygen dependence.  Patient will require hospitalization for further evaluation and treatment options.  Discussed case in detail with Dr. Chase Caller and Dr. Agustina Caroli absence.  Patient's been recommended to go to the emergency room.  Have contacted charge nurse at [facility] long emergency room that patient is in route.  Patient was recommended to go via EMS however patient declined.  Wants to go in private vehicle.  Patient advised of potential risk. ? ?

## 2021-04-17 NOTE — Progress Notes (Signed)
@Patient  ID: John [name], male    DOB: 05/29/1960, 61 y.o.   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  History of pneumonitis secondary to [facility] requiring prolonged steroid course, chronic respiratory failure on oxygen.  Right-sided pneumothorax Patient with medical history of atrial fibrillation on Eliquis, rheumatoid arthritis   TEST/EVENTS :   04/17/2021 Follow up : Pneumothorax-right-sided, squamous cell lung cancer, COPD, history of pneumonitis, chronic respiratory failure Patient presents for a post hospital follow-up.  Last month patient developed a large right-sided pneumothorax he was admitted on March 30, 2021 and required a pigtail catheter placement.  Patient had a prolonged air leak.  And was discharged with a pigtail catheter and express mini chamber. Patient returned back to the office earlier this week.  Complaining of significant right upper arm and shoulder swelling.  Patient with notable subcutaneous emphysema.  He was readmitted to the hospital and discharged on April 15, 2021.  Patient was found to have dislodged pigtail catheter and was removed.  Patient was continued on oxygen and follow-up chest x-ray showed significant improvement and near resolution of pneumothorax.  Patient says since discharge he is feeling better.  He has decreased shortness of breath and no significant chest pain.  He continues to have some swelling along his upper shoulder and chest wall.  Chest x-ray today in the office shows increased right pneumothorax without mediastinal shift estimated at 20%.  O2 saturations are 91% on room air. Patient denies any hemoptysis, fever, chest pain, orthopnea or syncope.    Allergies  Allergen Reactions   Albuterol Other (See Comments)    Patient  has had episode of atrial fib following administration of albuterol (tolerates Xopenex well)    Immunization History  Administered Date(s) Administered   PFIZER Comirnaty(Gray Top)Covid-19 Tri-Sucrose Vaccine 05/10/2019, 06/04/2019, 12/31/2019    Past Medical History:  Diagnosis Date   Atrial fibrillation with RVR ([place]) 05/12/2020   Dyspnea    Family history of adverse reaction to anesthesia    brother with seizures had episode under anesthesia.  Patient has seizures as well.   GERD (gastroesophageal reflux disease)    History of radiation therapy 04/24/2020-05/16/2020   IMRT to right lung     Dr [name]   PAF (paroxysmal atrial fibrillation) ([place])    CHADS2VSAC score 0   Rheumatoid aortitis    Sepsis ([place]) 10/06/2020   Squamous cell lung cancer ([place])     Tobacco History: Social History   Tobacco Use  Smoking Status Former   Packs/day: 1.00   Years: 30.00   Pack years: 30.00   Types: Cigarettes   Quit date: 2018   Years since quitting: 5.1  Smokeless Tobacco Never  Tobacco Comments   Former smoker 10/28/2020   Counseling given: Not Answered Tobacco comments: Former smoker 10/28/2020   Outpatient Medications Prior to Visit  Medication Sig Dispense Refill   acetaminophen (TYLENOL) 500 MG tablet Take 1,000 mg by mouth every 6 (six) hours as needed for mild pain.     apixaban (ELIQUIS) 5 MG TABS tablet Take 1 tablet (5 mg total) by mouth 2 (two) times daily. 60 tablet 6   levalbuterol (XOPENEX) 0.63 MG/3ML nebulizer solution Take 3 mLs (0.63 mg total) by nebulization every 8 (eight) hours as needed  for wheezing or shortness of breath. 240 mL 11   LORazepam (ATIVAN) 0.5 MG tablet Take 1 tablet (0.5 mg total) by mouth every 6 (six) hours as needed (Nausea or vomiting). 30 tablet 0   metoprolol succinate (TOPROL XL) 25 MG 24 hr tablet Take one tablet by mouth daily (Patient taking differently: 25 mg daily.)     ondansetron (ZOFRAN) 8 MG tablet Take 1 tablet (8 mg total) by  mouth 2 (two) times daily as needed for refractory nausea / vomiting. 30 tablet 1   PARoxetine (PAXIL) 10 MG tablet Take 10 mg by mouth at bedtime.     predniSONE (DELTASONE) 5 MG tablet Take 1 tablet (5 mg total) by mouth daily. 90 tablet 6   PRILOSEC OTC 20 MG tablet Take 20 mg by mouth at bedtime.     prochlorperazine (COMPAZINE) 10 MG tablet Take 1 tablet (10 mg total) by mouth every 6 (six) hours as needed (Nausea or vomiting). 30 tablet 1   umeclidinium-vilanterol (ANORO ELLIPTA) 62.5-25 MCG/ACT AEPB Inhale 1 puff into the lungs daily. 90 each 3   No facility-administered medications prior to visit.     Review of Systems:   Constitutional:   No  weight loss, night sweats,  Fevers, chills,  +fatigue, or  lassitude.  HEENT:   No headaches,  Difficulty swallowing,  Tooth/dental problems, or  Sore throat,                No sneezing, itching, ear ache, nasal congestion, post nasal drip,   CV:  No chest pain,  Orthopnea, PND, swelling in lower extremities, anasarca, dizziness, palpitations, syncope.   GI  No heartburn, indigestion, abdominal pain, nausea, vomiting, diarrhea, change in bowel habits, loss of appetite, bloody stools.   Resp: No shortness of breath with exertion or at rest.  No excess mucus, no productive cough,  No non-productive cough,  No coughing up of blood.  No change in color of mucus.  No wheezing.  No chest wall deformity  Skin: no rash or lesions.  GU: no dysuria, change in color of urine, no urgency or frequency.  No flank pain, no hematuria   MS:  No joint pain or swelling.  No decreased range of motion.  No back pain.    Physical Exam  BP 90/60 (BP Location: Left Arm, Patient Position: Sitting, Cuff Size: Normal)    Pulse 89    Temp 98.1 F (36.7 C) (Oral)    Ht 5\' 11"  (1.803 m)    Wt 233 lb (105.7 kg)    SpO2 91%    BMI 32.50 kg/m   GEN: A/Ox3; pleasant , NAD, well nourished    HEENT:  [place]/AT,  NOSE-clear, THROAT-clear, no lesions, no postnasal drip  or exudate noted.   NECK:  Supple w/ fair ROM; no JVD; normal carotid impulses w/o bruits; no thyromegaly or nodules palpated; no lymphadenopathy.    RESP diminished breath sounds on the right  no accessory muscle use, no dullness to percussion Palpable subcutaneous emphysema along the right upper arm and shoulder super clavicular area  CARD:  RRR, no m/r/g, tr  peripheral edema, pulses intact, no cyanosis or clubbing.  GI:   Soft & nt; nml bowel sounds; no organomegaly or masses detected.   Musco: Warm bil, no deformities or joint swelling noted.   Neuro: alert, no focal deficits noted.    Skin: Warm, no lesions or rashes    Lab Results:  CBC   BMET  BNP Discharged  ProBNP No results found for: PROBNP  Imaging: DG Chest 1 View  Result Date: 04/01/2021 CLINICAL DATA:  Pneumothorax EXAM: CHEST  1 VIEW COMPARISON:  Radiograph 03/31/2021 FINDINGS: Unchanged cardiomediastinal silhouette. Unchanged right perihilar opacity with cystic lucencies in the right midlung. There are unchanged interstitial and lower lung airspace opacities in the right lung. There are unchanged peripheral predominant interstitial opacities in the left lung with some more centrally faint opacity. Unchanged position of right basilar chest tube. Unchanged small residual right lateral pneumothorax. Bones are unchanged. IMPRESSION: Unchanged small residual right lateral pneumothorax with basilar chest tube in place. Unchanged parenchymal opacities bilaterally. Electronically Signed   By: Maurine Simmering M.D.   On: 04/01/2021 08:06   DG Chest 1 View  Result Date: 03/31/2021 CLINICAL DATA:  Pneumothorax. EXAM: CHEST  1 VIEW COMPARISON:  March 30, 2021. FINDINGS: Stable cardiomediastinal silhouette. Stable right midlung and left basilar opacities are noted. Stable small pneumothorax is noted laterally in the right hemithorax. Right-sided chest tube is unchanged. Bony thorax is unremarkable. IMPRESSION: Stable  position of right-sided chest tube with stable small right lateral pneumothorax. Stable bilateral lung opacities are noted. Electronically Signed   By: Marijo Conception M.D.   On: 03/31/2021 08:15   DG Chest 1 View  Result Date: 03/30/2021 CLINICAL DATA:  Status post chest tube insertion. EXAM: CHEST  1 VIEW COMPARISON:  Chest x-ray 03/30/2021 5:35 p.m. FINDINGS: The heart and mediastinal contours are unchanged. Aortic calcification. Low lung volume. Bilateral mid to lower lung zone interstitial and airspace opacities. No pulmonary edema. Interval placement of a right chest tube with pigtail overlying the right costophrenic angle. Interval decrease in size of a now small right pneumothorax. No pneumothorax. No acute osseous abnormality. IMPRESSION: 1. Interval placement of a right chest tube with pigtail overlying the right costophrenic angle. Interval decrease in size of a now small right pneumothorax. 2. Bilateral mid to lower lung zone interstitial and airspace opacities. 3.  Aortic Atherosclerosis (ICD10-I70.0). Electronically Signed   By: Iven Finn M.D.   On: 03/30/2021 19:03   DG Chest 2 View  Result Date: 04/17/2021 CLINICAL DATA:  Secondary spontaneous pneumothorax EXAM: CHEST - 2 VIEW COMPARISON:  Portable exam 1152 hours compared to 04/15/2021 FINDINGS: Normal heart size, mediastinal contours, and pulmonary vascularity. Chest wall emphysema RIGHT chest again identified. Increase in RIGHT pneumothorax since previous exam. No mediastinal shift. BILATERAL pulmonary infiltrates increased since previous exam. No pleural effusion or acute osseous findings. IMPRESSION: Increased RIGHT pneumothorax without mediastinal shift, estimated 20%. Increased pulmonary infiltrates since previous study. These results will be called to the ordering clinician or representative by the Radiologist Assistant, and communication documented in the PACS or [employer]. Electronically Signed   By: Lavonia Dana M.D.    On: 04/17/2021 12:07   DG Chest 2 View  Result Date: 04/14/2021 CLINICAL DATA:  Chest tube removal, history of pneumothorax, lung cancer EXAM: CHEST - 2 VIEW COMPARISON:  04/14/2021 at 5:55 p.m. FINDINGS: Frontal and lateral views of the chest demonstrate interval removal of the right pigtail drainage catheter seen previously. Extensive subcutaneous gas is again seen throughout the right chest wall. There is evidence of a small recurrent right-sided pneumothorax, volume estimated approximately 10%. Slight increased consolidation in the right perihilar region consistent with atelectasis superimposed upon prior airspace disease. Stable patchy consolidation within the left lung base. No pleural effusion. No acute bony abnormalities. IMPRESSION: 1. Small recurrent right pneumothorax after chest tube removal, volume estimated 10%.  2. Extensive subcutaneous gas throughout the right chest wall, stable. 3. Bilateral lung consolidation, slightly increased in the right perihilar region. This likely reflects atelectasis superimposed upon chronic airspace disease. Critical Value/emergent results were called by telephone at the time of interpretation on 04/14/2021 at 7:01 pm to provider [facility] , who verbally acknowledged these results. Electronically Signed   By: Randa Ngo M.D.   On: 04/14/2021 19:05   DG Chest 2 View  Result Date: 04/14/2021 CLINICAL DATA:  Right pneumothorax EXAM: CHEST - 2 VIEW COMPARISON:  Previous studies including the examination done earlier today FINDINGS: Right chest tube is noted with its tip in the lateral aspect of right upper lung fields. There is interval resolution of small right apical pneumothorax. Subcutaneous emphysema is seen in the right chest wall and right lower neck. There is possible bulla or cavitary pneumonia in right parahilar region which has not changed significantly. There are linear densities in the lower lung fields with interval improvement. There are no new  focal infiltrates. There is blunting of both lateral CP angles. IMPRESSION: There is interval resolution of right pneumothorax. There is improvement in aeration of lower lung fields suggesting resolving atelectasis/pneumonia. Electronically Signed   By: Elmer Picker M.D.   On: 04/14/2021 18:08   DG Chest 2 View  Result Date: 04/14/2021 CLINICAL DATA:  Shortness of breath. EXAM: CHEST - 2 VIEW COMPARISON:  04/02/2021 FINDINGS: Two views of the chest demonstrated a right pneumothorax measuring at least 10-15% size. Difficult to evaluate the pneumothorax due to a large amount of subcutaneous gas in the right chest. Subcutaneous gas is new. However, the pneumothorax is likely enlarged since 04/02/2021. There is a pigtail chest tube which appears to be in the anterior right chest based on the lateral view. It is possible that a side hole could be outside of the chest. Again noted are patchy densities in the right perihilar region. Coarse interstitial densities in the periphery of the left lung are again noted. Trachea is midline. Heart size is stable. IMPRESSION: 1. Enlargement of the right pneumothorax measuring at least 10-15% in size but limited evaluation due to a large amount of subcutaneous gas. The subcutaneous gas is new. 2. Right chest tube is present and likely within the anterior right chest. Only a small portion of the tube appears to be within the chest and it is possible a side hole could be outside of the pleural space. 3. Persistent densities in the right perihilar region. 4. Chronic changes in the periphery of the left lung. Electronically Signed   By: Markus Daft M.D.   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 Evaluate treatment response. Shortness of breath EXAM: CT CHEST, ABDOMEN, AND PELVIS WITH CONTRAST TECHNIQUE: Multidetector CT imaging of the chest, abdomen and pelvis was  performed following the standard protocol during bolus administration of intravenous contrast. RADIATION DOSE REDUCTION: This exam was performed according to the departmental dose-optimization program which includes automated exposure control, adjustment of the mA and/or kV according to patient size and/or use of iterative reconstruction technique. CONTRAST:  161mL ISOVUE-300 IOPAMIDOL (ISOVUE-300) INJECTION 61% COMPARISON:  12/31/2020 chest CT.  Abdominopelvic CT 07/18/2020 FINDINGS: CT CHEST FINDINGS Cardiovascular: Aortic atherosclerosis. Normal heart size with lipomatous hypertrophy of the interatrial septum. Lad and left circumflex coronary artery calcification. Mediastinum/Nodes: No supraclavicular adenopathy. No mediastinal or hilar adenopathy. Lungs/Pleura: No pleural fluid. Development of a large  right-sided pneumothorax. The previously described pneumomediastinum has resolved. Centrilobular emphysema. The right lung is poorly evaluated secondary to collapse. Suspect underlying fibrosis, with architectural distortion and scattered ground-glass. Right upper lobe or superior segment lower lobe dominant bulla/blebs on 63/4. Underlying centrilobular emphysema. 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 Musculoskeletal: No acute osseous abnormality. CT ABDOMEN PELVIS FINDINGS Hepatobiliary: Normal liver. 1.8 cm stone in the gallbladder neck. No acute cholecystitis or biliary duct dilatation. Pancreas: Pancreatic fatty replacement throughout the body and head. No duct dilatation or acute inflammation. Spleen: Minimal splenic hypoattenuation along the capsule on 62/2 likely represents sequelae of splenic infarct. Adrenals/Urinary Tract: Normal left adrenal gland. Bilobed right adrenal mass measures 4.9 x 3.1 cm on 58/2 versus 5.8 x 3.0 cm on the prior exam. Too small to characterize interpolar left renal lesion. Normal right kidney. No  hydronephrosis. Normal urinary bladder. Stomach/Bowel: Normal stomach, without wall thickening. Normal colon, appendix, and terminal ileum. Normal small bowel. Vascular/Lymphatic: Aortic atherosclerosis. No abdominopelvic adenopathy. Reproductive: Normal prostate. Other: Small fat containing left inguinal hernia. No significant free fluid. No evidence of omental or peritoneal disease. Tiny fat containing ventral abdominal wall hernia. Musculoskeletal: L2 mild compression deformity or Schmorl's node, similar. IMPRESSION: CT CHEST IMPRESSION 1. Large right-sided pneumothorax. 2. Suboptimal evaluation of the right lung. Suspect treatment related fibrosis, as before. 3. No thoracic adenopathy. 4. Coronary artery atherosclerosis. Aortic Atherosclerosis (ICD10-I70.0). Emphysema (ICD10-J43.9). CT ABDOMEN AND PELVIS IMPRESSION 1. 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 2. No new or progressive abdominopelvic metastasis. 3. Cholelithiasis. 4. Sequelae of splenic infarct. A call to the emergency room provider at [facility] (where patient is currently) is pending as of 6:08 p.m. Electronically Signed   By: Abigail Miyamoto M.D.   On: 03/30/2021 18:08   DG Chest Port 1 View  Result Date: 04/15/2021 CLINICAL DATA:  Right pneumothorax EXAM: PORTABLE CHEST 1 VIEW COMPARISON:  Previous studies including the examination done earlier today FINDINGS: There is interval decrease in small pneumothorax in the lateral aspect of right mid lung fields. There is no demonstrable apical pneumothorax. There are linear densities in the right parahilar region along with emphysematous bullae which have not changed significantly. There is blunting of right lateral CP angle. There is subcutaneous emphysema. IMPRESSION: There is interval decrease in loculated pneumothorax in the lateral aspect of right mid lung fields. There is no demonstrable apical pneumothorax. Linear densities are seen in the right parahilar region suggesting  scarring or subsegmental atelectasis. Bullae are noted in the right parahilar region. Electronically Signed   By: Elmer Picker M.D.   On: 04/15/2021 14:34   DG CHEST PORT 1 VIEW  Result Date: 04/15/2021 CLINICAL DATA:  pneumothorax EXAM: PORTABLE CHEST - 1 VIEW COMPARISON:  the previous day's study FINDINGS: Small residual lateral right pneumothorax, slightly less conspicuous than on prior study. Moderate right subcutaneous emphysema as before. Stable right perihilar consolidation and cystic changes in the right mid lung. Mild left infrahilar airspace opacities. Heart size and mediastinal contours are otherwisewithin normal limits. No  definite pleural effusion. Visualized bones unremarkable. IMPRESSION: Stable small right lateral pneumothorax, with extensive subcutaneous emphysema. Electronically Signed   By: Lucrezia Europe M.D.   On: 04/15/2021 08:11   DG CHEST PORT 1 VIEW  Result Date: 04/02/2021 CLINICAL DATA:  Status post chest tube placement. EXAM: PORTABLE CHEST 1 VIEW COMPARISON:  04/02/21 at 9:02 a.m. FINDINGS: There is been interval placement a right-sided chest tube with significant reduction  in volume of right-sided pneumothorax. The right pneumothorax measures approximately 4 mm measured over the lateral right lung. This is compared with 3.9 cm earlier today. Similar appearance of the right mid lung cavitary mass. There is diffuse peripheral and basilar predominant reticular interstitial opacities within the left lung, unchanged. IMPRESSION: 1. Interval placement of right-sided chest tube with significant reduction in volume of right-sided pneumothorax. 2. No change in right mid lung cavitary mass. Electronically Signed   By: Kerby Moors M.D.   On: 04/02/2021 12:26   DG CHEST PORT 1 VIEW  Result Date: 04/02/2021 CLINICAL DATA:  Shortness of breath, pneumothorax EXAM: PORTABLE CHEST 1 VIEW COMPARISON:  04/01/2021 FINDINGS: Cardiomegaly. Interval removal of a previously seen right-sided  pigtail chest tube. Large right pneumothorax, approximately 70% volume, increased compared to prior examination. Cavitary lesion of the right midlung again noted. Diffuse interstitial opacity of the left lung. The visualized skeletal structures are unremarkable. IMPRESSION: 1. Large right pneumothorax, approximately 70% volume, increased compared to prior examination following removal of chest tube. 2. Cavitary lesion of the right midlung again noted. 3. Diffuse interstitial opacity of the left lung, likely edema in the setting of cardiomegaly. 4. Cardiomegaly. These results will be called to the ordering clinician or representative by the Radiologist Assistant, and communication documented in the PACS or [employer]. Electronically Signed   By: Delanna Ahmadi M.D.   On: 04/02/2021 09:17   DG Chest Port 1 View  Result Date: 03/30/2021 CLINICAL DATA:  Pneumothorax EXAM: PORTABLE CHEST 1 VIEW COMPARISON:  01/12/2021, CT 12/31/2020, chest x-ray 05/12/2020, 10/10/2020 FINDINGS: Large cavitary lesion in the right mid lung. Underlying coarse interstitial opacity. Normal cardiac size with aortic atherosclerosis. Right-sided pneumothorax approaches 50%. Slight midline shift to the left. IMPRESSION: 1. Large right-sided pneumothorax approaches 50%. 2. There is underlying interstitial disease. Cavitary lesion in the right mid lung as noted on previous exam Critical Value/emergent results were called by telephone at the time of interpretation on 03/30/2021 at 5:56 pm to provider [facility] , who verbally acknowledged these results. Electronically Signed   By: Donavan Foil M.D.   On: 03/30/2021 17:56    dexamethasone (DECADRON) 10 mg in sodium chloride 0.9 % 50 mL IVPB     Date Action Dose Route User   Admitted on 04/17/2021   Discharged on 04/15/2021   Admitted on 04/14/2021   Discharged on 04/03/2021   Admitted on 03/30/2021   02/19/2021 0859 Rate/Dose Change (none) Intravenous Lucile Crater, RN   02/19/2021 0849  New Bag/Given 10 mg Intravenous Lucile Crater, RN      dexamethasone (DECADRON) 10 mg in sodium chloride 0.9 % 50 mL IVPB     Date Action Dose Route User   Admitted on 04/17/2021   Discharged on 04/15/2021   Admitted on 04/14/2021   Discharged on 04/03/2021   Admitted on 03/30/2021   03/13/2021 1048 Rate/Dose Change (none) Intravenous Hill, Tiffany A, LPN   5/68/1275 1700 New Bag/Given 10 mg Intravenous Lucile Crater, RN      DOCEtaxel (TAXOTERE) 130 mg in sodium chloride 0.9 % 250 mL chemo infusion     Date Action Dose Route User   Admitted on 04/17/2021   Discharged on 04/15/2021   Admitted on 04/14/2021   Discharged on 04/03/2021   Admitted on 03/30/2021   02/19/2021 0939 Rate/Dose Change (none) Intravenous Lucile Crater, RN   02/19/2021 1749 New Bag/Given 130 mg Intravenous Lucile Crater, RN      DOCEtaxel (  TAXOTERE) 130 mg in sodium chloride 0.9 % 250 mL chemo infusion     Date Action Dose Route User   Admitted on 04/17/2021   Discharged on 04/15/2021   Admitted on 04/14/2021   Discharged on 04/03/2021   Admitted on 03/30/2021   03/13/2021 1142 Rate/Dose Change (none) Intravenous Hill, Caro Hight, LPN   0/25/8527 7824 New Bag/Given 130 mg Intravenous Lucile Crater, RN      0.9 %  sodium chloride infusion     Date Action Dose Route User   Admitted on 04/17/2021   Discharged on 04/15/2021   Admitted on 04/14/2021   Discharged on 04/03/2021   Admitted on 03/30/2021   02/19/2021 0948 Infusion Verify (none) Intravenous Lucile Crater, RN   02/19/2021 [phone] Rate/Dose Change (none) Intravenous Lucile Crater, RN   02/19/[phone] Rate/Dose Change (none) Intravenous Lucile Crater, RN   02/19/2021 [phone] Rate/Dose Change (none) Intravenous Lucile Crater, RN   02/19/2021 0910 Rate/Dose Change (none) Intravenous Lucile Crater, RN      0.9 %  sodium chloride infusion     Date Action Dose Route User   Admitted on 04/17/2021   Discharged on 04/15/2021   Admitted on 04/14/2021   Discharged on 04/03/2021    Admitted on 03/30/2021   03/13/2021 1245 Rate/Dose Change (none) Intravenous Hill, Tiffany A, LPN   0/09/6759 9509 Rate/Dose Change (none) Intravenous Hill, Tiffany A, LPN   05/11/7122 5809 Rate/Dose Change (none) Intravenous Hill, Tiffany A, LPN   9/83/3825 0539 Rate/Dose Change (none) Intravenous Hill, Tiffany A, LPN   7/67/3419 3790 Rate/Dose Change (none) Intravenous Hill, Tiffany A, LPN       PFT Results Latest Ref Rng & Units 06/11/2020  FVC-Pre L 4.68  FVC-Predicted Pre % 93  FVC-Post L 4.87  FVC-Predicted Post % 97  Pre FEV1/FVC % % 65  Post FEV1/FCV % % 72  FEV1-Pre L 3.05  FEV1-Predicted Pre % 80  FEV1-Post L 3.51  DLCO uncorrected ml/min/mmHg 18.23  DLCO UNC% % 63  DLCO corrected ml/min/mmHg 22.14  DLCO COR %Predicted % 76  DLVA Predicted % 79  TLC L 7.53  TLC % Predicted % 104  RV % Predicted % 127    No results found for: NITRICOXIDE      Assessment & Plan:   Pneumothorax on right Recurrent right-sided pneumothorax-patient's had difficulty over the last 2 to 3 weeks with multiple hospitalizations and pigtail catheters.  Unfortunately on return visit today chest x-ray shows increased right-sided pneumothorax at 20%.  Patient is high risk for decompensation.  He has underlying stage IVa lung cancer, oxygen dependence.  Patient will require hospitalization for further evaluation and treatment options.  Discussed case in detail with Dr. Chase Caller and Dr. Agustina Caroli absence.  Patient's been recommended to go to the emergency room.  Have contacted charge nurse at [facility] long emergency room that patient is in route.  Patient was recommended to go via EMS however patient declined.  Wants to go in private vehicle.  Patient advised of potential risk.     Rexene Edison, NP 04/17/2021

## 2021-04-17 NOTE — ED Provider Notes (Signed)
[place] DEPT Provider Note   CSN: [id number] Arrival date & time: 04/17/21  1244     History  Chief Complaint  Patient presents with   partial lung collapse    John [name] is a 61 y.o. male.  61 year old male with prior medical history as detailed below presents for evaluation.  Patient with recent treatment of current spontaneous pneumothorax.  Patient was discharged on March 1 after most recent treatment of the pneumothorax.  He was obtaining a repeat chest x-ray today prior to being seen in the outpatient clinic.  Patient without current complaint.  He denies feeling short of breath.  He denies chest pain.  He reports that he has already had 2 chest tubes over the last month for same pneumothorax.  He is not inclined at this time for repeat chest tube placement.  The history is provided by the patient and medical records.  Illness Location:  Recurrent right-sided pneumothorax Severity:  Mild Onset quality:  Unable to specify Timing:  Unable to specify Progression:  Unchanged Chronicity:  Recurrent     Home Medications Prior to Admission medications   Medication Sig Start Date End Date Taking? Authorizing Provider  acetaminophen (TYLENOL) 500 MG tablet Take 1,000 mg by mouth every 6 (six) hours as needed for mild pain.    [provider]  apixaban (ELIQUIS) 5 MG TABS tablet Take 1 tablet (5 mg total) by mouth 2 (two) times daily. 01/05/21   Fenton, Clint R, PA  levalbuterol (XOPENEX) 0.63 MG/3ML nebulizer solution Take 3 mLs (0.63 mg total) by nebulization every 8 (eight) hours as needed for wheezing or shortness of breath. 01/29/21   [name], MD  LORazepam (ATIVAN) 0.5 MG tablet Take 1 tablet (0.5 mg total) by mouth every 6 (six) hours as needed (Nausea or vomiting). 01/29/21   Volanda Napoleon, MD  metoprolol succinate (TOPROL XL) 25 MG 24 hr tablet Take one tablet by mouth daily Patient taking differently: 25 mg daily.  01/05/21   Fenton, Clint R, PA  ondansetron (ZOFRAN) 8 MG tablet Take 1 tablet (8 mg total) by mouth 2 (two) times daily as needed for refractory nausea / vomiting. 01/29/21   Volanda Napoleon, MD  PARoxetine (PAXIL) 10 MG tablet Take 10 mg by mouth at bedtime. 01/25/20   [provider]  predniSONE (DELTASONE) 5 MG tablet Take 1 tablet (5 mg total) by mouth daily. 03/13/21   Volanda Napoleon, MD  PRILOSEC OTC 20 MG tablet Take 20 mg by mouth at bedtime. 11/13/19   [provider]  prochlorperazine (COMPAZINE) 10 MG tablet Take 1 tablet (10 mg total) by mouth every 6 (six) hours as needed (Nausea or vomiting). 01/29/21   Volanda Napoleon, MD  umeclidinium-vilanterol (ANORO ELLIPTA) 62.5-25 MCG/ACT AEPB Inhale 1 puff into the lungs daily. 01/29/21 04/29/21  [name], MD      Allergies    Albuterol    Review of Systems   Review of Systems  All other systems reviewed and are negative.  Physical Exam Updated Vital Signs BP 107/74    Pulse 71    Temp 98.1 F (36.7 C) (Oral)    Resp 18    SpO2 95%  Physical Exam Vitals and nursing note reviewed.  Constitutional:      General: He is not in acute distress.    Appearance: Normal appearance. He is well-developed.  HENT:     Head: Normocephalic and atraumatic.  Eyes:  Conjunctiva/sclera: Conjunctivae normal.     Pupils: Pupils are equal, round, and reactive to light.  Cardiovascular:     Rate and Rhythm: Normal rate and regular rhythm.     Heart sounds: Normal heart sounds.  Pulmonary:     Effort: Pulmonary effort is normal. No respiratory distress.     Comments: Slightly decreased breath sounds on right Abdominal:     General: There is no distension.     Palpations: Abdomen is soft.     Tenderness: There is no abdominal tenderness.  Musculoskeletal:        General: No deformity. Normal range of motion.     Cervical back: Normal range of motion and neck supple.  Skin:    General: Skin is warm and dry.   Neurological:     General: No focal deficit present.     Mental Status: He is alert and oriented to person, place, and time.    ED Results / Procedures / Treatments   Labs (all labs ordered are listed, but only abnormal results are displayed) Labs Reviewed  CBC WITH DIFFERENTIAL/PLATELET - Abnormal; Notable for the following components:      Result Value   RBC 3.77 (*)    Hemoglobin 10.9 (*)    HCT 33.8 (*)    RDW 16.0 (*)    Eosinophils Absolute 1.0 (*)    All other components within normal limits  BASIC METABOLIC PANEL - Abnormal; Notable for the following components:   Glucose, Bld 107 (*)    Calcium 8.2 (*)    All other components within normal limits  RESP PANEL BY RT-PCR (FLU A&B, COVID) ARPGX2  SEDIMENTATION RATE    EKG None  Radiology DG Chest 2 View  Result Date: 04/17/2021 CLINICAL DATA:  Secondary spontaneous pneumothorax EXAM: CHEST - 2 VIEW COMPARISON:  Portable exam 1152 hours compared to 04/15/2021 FINDINGS: Normal heart size, mediastinal contours, and pulmonary vascularity. Chest wall emphysema RIGHT chest again identified. Increase in RIGHT pneumothorax since previous exam. No mediastinal shift. BILATERAL pulmonary infiltrates increased since previous exam. No pleural effusion or acute osseous findings. IMPRESSION: Increased RIGHT pneumothorax without mediastinal shift, estimated 20%. Increased pulmonary infiltrates since previous study. These results will be called to the ordering clinician or representative by the Radiologist Assistant, and communication documented in the PACS or [employer]. Electronically Signed   By: Lavonia Dana M.D.   On: 04/17/2021 12:07    Procedures Procedures    Medications Ordered in ED Medications - No data to display  ED Course/ Medical Decision Making/ A&P                           Medical Decision Making Amount and/or Complexity of Data Reviewed Labs: ordered.  Risk Decision regarding  hospitalization.    Medical Screen Complete  This patient presented to the ED with complaint of right-sided pneumothorax.  This complaint involves an extensive number of treatment options. The initial differential diagnosis includes, but is not limited to, recurrent spontaneous right-sided pneumothorax  This presentation is: Acute, Chronic, Self-Limited, Previously Undiagnosed, Uncertain Prognosis, Complicated, Systemic Symptoms, and Threat to Life/Bodily Function  Patient is presenting with recurrent right-sided spontaneous pneumothorax.  Patient with recent admission x2 for this in the last month.  Patient is status post 2 separate placement of chest tubes to treat his right-sided pneumothorax.  Patient is currently asymptomatic.  He was being evaluated in the outpatient clinic after recent admission and discharged on  March 1.  Repeat imaging today to reveal evidence of persistent right-sided pneumothorax with approximately 20% of lung collapse.  Patient is asymptomatic.  Patient is not inclined at this time for repeat chest tube placement.  Case discussed briefly with Dr. Melvyn Novas of critical care.  He will consult.  Hospitalist service aware of case and will evaluate for admission   Co morbidities that complicated the patient's evaluation  COPD, recurrent spontaneous pneumothorax   Additional history obtained:  External records from outside sources obtained and reviewed including prior ED visits and prior Inpatient records.    Lab Tests:  I ordered and personally interpreted labs.  The pertinent results include: CBC, BMP, COVID, flu, sed rate   Imaging Studies ordered:  I ordered imaging studies including chest x-ray I independently visualized and interpreted obtained imaging which showed right-sided pneumothorax I agree with the radiologist interpretation.   Cardiac Monitoring:  The patient was maintained on a cardiac monitor.  I personally viewed and interpreted  the cardiac monitor which showed an underlying rhythm of: NSR   Problem List / ED Course:  Recurrent right-sided pneumothorax   Reevaluation:  After the interventions noted above, I reevaluated the patient and found that they have: improved  Disposition:  After consideration of the diagnostic results and the patients response to treatment, I feel that the patent would benefit from admission for observation and further work-up.          Final Clinical Impression(s) / ED Diagnoses Final diagnoses:  Pneumothorax    Rx / DC Orders ED Discharge Orders     None         Valarie Merino, MD 04/17/21 1510

## 2021-04-17 NOTE — Progress Notes (Addendum)
PCCM Interval Progress Note ? ?Pt known to our service.  In brief, he initially was admitted 2/13 - 2/17 for R PTX requiring pigtail insertion. This was removed 2/16 but he unfortunately had recurrence requiring replacement pigtail.  2/17 this was transitioned to mini express and he was discharged home.  2/23 he had follow up in our office and chest tube left on water seal until his return for follow up.  2/28, he had 2nd follow up 2/2 increased swelling of RUE and R chest and dyspnea, and was found to have worsening R PTX and subcutaneous emphysema in setting chest tube malfunction; therefore, sent to [facility] ED. ?In ED, chest tube was noted to be retracted with side port presumably in the subcutaneous tissue.  This was therefore discontinued and pt was monitored with serial CXR's. ?CXR remained stable through 3/1 when he was discharged home with instructions to follow up in our office 3/3 with repeat CXR. ?Unfortunately at this follow up, he had expansion of his PTX and was therefore instructed to return to ED that same day. ? ?He tells me that he has been asymptomatic and actually felt much better.  Since discharge, he has been able to sleep better then he did when he was in the hospital and had chest tube.  He has not had any dyspnea, cough, chest pain.  Subcutaneous emphysema of right arm and shoulder has been gradually improving, now isolated to upper right arm and shoulder.  His vitals are currently stable. He tells me that he would really prefer not to have repeat chest tube performed if possible; however, if needed then he would be fine with it. ? ?CXR reviewed and this shows increase in PTX around 20% (previously ~10%) along with increased infiltrates. ? ? ?BP 109/76   Pulse 72   Temp 98.1 ?F (36.7 ?C) (Oral)   Resp 16   SpO2 100%  ? ?Exam: ?Gen: Adult male in NAD. ?Neuro: A&O x 3, no deficits. ?Lungs: Normal effort. Faint crackles bilaterally, otherwise clear. ?Heart: RRR, no M/R/G. ?Ext: Subcutaneous  emphysema palpable in RUE and R shoulder. ?Skin: Warm and dry. ? ? ?Discussion / Plan: ? ?Recurrent / Unresolved PTX - clinically, he is doing quite well considering his expansion of PTX.  He has been asymptomatic and felt better in fact.  He would rather not have repeat chest tube if possible and would prefer for conservative therapy and watchful waiting.  He does give consent for chest tube placement if we felt this was in his best interest. ? ?Given his appearance and hx, it is reasonable to proceed with watchful waiting and serial CXR's.  His last CXR was at 1130.  We will order a repeat CXR for tonight at 1800 and again tomorrow AM.  In the meantime, we will place him on NRB to attempt to help PTX resolve spontaneously.  ?If he clinically worsens or becomes symptomatic, he knows that he will require chest tube placement and is in agreement in that instance. ? ?PCCM will follow peripherally for now. Please call us back if CXR worsens or his clinical picture changes.  Otherwise, continue watchful waiting with serial CXR monitoring and continued NRB for nitrogen washout. He should also remain on bedrest and avoid any significant exertion. ? ?Plan discussed with Dr. Melvyn Novas as well as pt and his wife who are all in agreement. ? ? ?Montey Hora, PA - C ?[facility] ?For pager details, please see AMION or use Epic  chat  ?After 1900, please call [facility] for cross coverage needs ?04/17/2021, 2:35 PM ? ?Pt seen and examined in ER and presently s chest symptoms. ?He has had 2 chest tubes placed and his anatomy may not be amenable to a 3rd tube if there are any pleural scars that may be torn in placement potentially making the problem worse.  He is rightfully frustrated as is his wife [name] and discussed the problem in as much detail as I could from the records and films available - will make Dr Vaughan Browner aware for weekend and keep on [facility] for next 24 h. ? 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 ? ?Christinia Gully, MD ?Pulmonary and Critical Care Medicine ?[place] ?Cell [phone]  ? ?After 7:00 pm call Elink  [phone]  ? ?

## 2021-04-17 NOTE — ED Triage Notes (Signed)
PT arrived in POV. Recently suffered R lung collapse. Was treated at hospital for it. Went to Pumonologist this morning and a 20% volume reduction in R lung was noted. Pt told to come to ER.  ? ?Pt on 3L [place] at baseline ? ?Aox4 ?No SOB ?

## 2021-04-17 NOTE — ED Notes (Signed)
Pt placed on NRB per order. ?

## 2021-04-18 ENCOUNTER — Observation Stay (HOSPITAL_COMMUNITY): Payer: 59

## 2021-04-18 ENCOUNTER — Encounter (HOSPITAL_COMMUNITY): Payer: Self-pay | Admitting: Internal Medicine

## 2021-04-18 DIAGNOSIS — Z85828 Personal history of other malignant neoplasm of skin: Secondary | ICD-10-CM

## 2021-04-18 DIAGNOSIS — I4819 Other persistent atrial fibrillation: Secondary | ICD-10-CM | POA: Diagnosis not present

## 2021-04-18 DIAGNOSIS — D649 Anemia, unspecified: Secondary | ICD-10-CM

## 2021-04-18 DIAGNOSIS — J9312 Secondary spontaneous pneumothorax: Secondary | ICD-10-CM | POA: Diagnosis not present

## 2021-04-18 DIAGNOSIS — C3491 Malignant neoplasm of unspecified part of right bronchus or lung: Secondary | ICD-10-CM

## 2021-04-18 DIAGNOSIS — J9381 Chronic pneumothorax: Secondary | ICD-10-CM

## 2021-04-18 DIAGNOSIS — J939 Pneumothorax, unspecified: Secondary | ICD-10-CM | POA: Diagnosis not present

## 2021-04-18 HISTORY — DX: Anemia, unspecified: D64.9

## 2021-04-18 LAB — CBC
HCT: 33 % — ABNORMAL LOW (ref 39.0–52.0)
Hemoglobin: 10.3 g/dL — ABNORMAL LOW (ref 13.0–17.0)
MCH: 27.9 pg (ref 26.0–34.0)
MCHC: 31.2 g/dL (ref 30.0–36.0)
MCV: 89.4 fL (ref 80.0–100.0)
Platelets: 279 10*3/uL (ref 150–400)
RBC: 3.69 MIL/uL — ABNORMAL LOW (ref 4.22–5.81)
RDW: 16 % — ABNORMAL HIGH (ref 11.5–15.5)
WBC: 6.1 10*3/uL (ref 4.0–10.5)
nRBC: 0 % (ref 0.0–0.2)

## 2021-04-18 LAB — COMPREHENSIVE METABOLIC PANEL
ALT: 9 U/L (ref 0–44)
AST: 11 U/L — ABNORMAL LOW (ref 15–41)
Albumin: 3.1 g/dL — ABNORMAL LOW (ref 3.5–5.0)
Alkaline Phosphatase: 57 U/L (ref 38–126)
Anion gap: 8 (ref 5–15)
BUN: 10 mg/dL (ref 6–20)
CO2: 27 mmol/L (ref 22–32)
Calcium: 8.6 mg/dL — ABNORMAL LOW (ref 8.9–10.3)
Chloride: 99 mmol/L (ref 98–111)
Creatinine, Ser: 1.16 mg/dL (ref 0.61–1.24)
GFR, Estimated: >60 mL/min (ref >60)
Glucose, Bld: 94 mg/dL (ref 70–99)
Potassium: 3.6 mmol/L (ref 3.5–5.1)
Sodium: 134 mmol/L — ABNORMAL LOW (ref 135–145)
Total Bilirubin: 0.5 mg/dL (ref 0.3–1.2)
Total Protein: 6.5 g/dL (ref 6.5–8.1)

## 2021-04-18 MED ORDER — PANTOPRAZOLE SODIUM 40 MG PO TBEC
40.0000 mg | DELAYED_RELEASE_TABLET | Freq: Every day | ORAL | Status: DC
  Administered 2021-04-18 – 2021-04-22 (×5): 40 mg via ORAL
  Filled 2021-04-18 (×5): qty 1

## 2021-04-18 MED ORDER — UMECLIDINIUM-VILANTEROL 62.5-25 MCG/ACT IN AEPB
1.0000 | INHALATION_SPRAY | Freq: Every day | RESPIRATORY_TRACT | Status: DC
  Administered 2021-04-18 – 2021-04-23 (×6): 1 via RESPIRATORY_TRACT
  Filled 2021-04-18: qty 14

## 2021-04-18 MED ORDER — LORAZEPAM 0.5 MG PO TABS: 0.5000 mg | ORAL_TABLET | Freq: Four times a day (QID) | ORAL | Status: DC | PRN

## 2021-04-18 MED ORDER — METOPROLOL SUCCINATE ER 25 MG PO TB24
25.0000 mg | ORAL_TABLET | Freq: Every day | ORAL | Status: DC
  Administered 2021-04-18 – 2021-04-22 (×5): 25 mg via ORAL
  Filled 2021-04-18 (×5): qty 1

## 2021-04-18 MED ORDER — PAROXETINE HCL 10 MG PO TABS
10.0000 mg | ORAL_TABLET | Freq: Every day | ORAL | Status: DC
  Administered 2021-04-18 – 2021-04-22 (×5): 10 mg via ORAL
  Filled 2021-04-18 (×5): qty 1

## 2021-04-18 MED ORDER — PROCHLORPERAZINE MALEATE 10 MG PO TABS: 10.0000 mg | ORAL_TABLET | Freq: Four times a day (QID) | ORAL | Status: DC | PRN

## 2021-04-18 MED ORDER — PREDNISONE 5 MG PO TABS
5.0000 mg | ORAL_TABLET | Freq: Every day | ORAL | Status: DC
  Administered 2021-04-18 – 2021-04-23 (×6): 5 mg via ORAL
  Filled 2021-04-18 (×6): qty 1

## 2021-04-18 MED ORDER — LEVALBUTEROL HCL 0.63 MG/3ML IN NEBU: 0.6300 mg | INHALATION_SOLUTION | Freq: Three times a day (TID) | RESPIRATORY_TRACT | Status: DC | PRN

## 2021-04-18 MED ORDER — HEPARIN SODIUM (PORCINE) 5000 UNIT/ML IJ SOLN
5000.0000 [IU] | Freq: Three times a day (TID) | INTRAMUSCULAR | Status: DC
  Administered 2021-04-18 – 2021-04-20 (×6): 5000 [IU] via SUBCUTANEOUS
  Filled 2021-04-18 (×6): qty 1

## 2021-04-18 NOTE — Assessment & Plan Note (Addendum)
Continue Toprol, restarted Eliquis as an outpatient. ?

## 2021-04-18 NOTE — Progress Notes (Signed)
?  Progress Note ? ? ?PatientYonathan [name] GEZ:[id number] DOB: 1960/02/17 DOA: 04/17/2021     0 ?DOS: the patient was seen and examined on 04/18/2021 ?  ?Brief hospital course: ?61 year old male PMH recurrent pneumothorax, sent from pulmonary office for admission for recurrent pneumothorax. ?--3/4 pneumothorax persists without significant change.  Pulmonology pursuing conservative management at this time.  May still need chest tube. ? ?Assessment and Plan: ?* Pneumothorax ?-- Recurrent.  Has underlying emphysema.  Has subcutaneous and previous failed chest tube ?--Management per pulmonology, currently conservative as pneumothorax improving.  Continues on a nonrebreather for nitrogen washout. ? ?Stage 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 (HCC) ?--followed by John [name]. ? ?Normocytic anemia ?--stable, follow-up as an outpatient ? ?Persistent atrial fibrillation ([place]) ?--stable, resume apixaban when able; hold for now in case chest tube needed ?--continue Toprol-XL ? ? ? ? ?Subjective:  ?Feels fine, breathing fine, no pain ? ?Physical Exam: ?Vitals:  ? 04/18/21 0600 04/18/21 0630 04/18/21 0937 04/18/21 1319  ?BP: 103/73 99/74 119/76   ?Pulse: 66 69 73   ?Resp: 14 14 16    ?Temp:   97.8 ?F (36.6 ?C)   ?TempSrc:   Oral   ?SpO2: 100% 100% 100%   ?Weight:    105.7 kg  ?Height:    5\' 11"  (1.803 m)  ? ?Physical Exam ?Constitutional:   ?   General: He is not in acute distress. ?   Appearance: He is not ill-appearing or toxic-appearing.  ?Cardiovascular:  ?   Rate and Rhythm: Normal rate and regular rhythm.  ?   Heart sounds: No murmur heard. ?Pulmonary:  ?   Effort: Pulmonary effort is normal. No respiratory distress.  ?   Breath sounds: No wheezing, rhonchi or rales.  ?Neurological:  ?   Mental Status: He is alert.  ?Psychiatric:     ?   Mood and Affect: Mood normal.     ?   Behavior: Behavior normal.  ? ? ? ?Data Reviewed: ? ?CMP noted ?Hgb stable 10.3 ? ?Family Communication:  ? ?Disposition: ?Status is: Observation ? ? ?  Planned Discharge Destination: Home ? ? ? ? ?Time spent: 20 minutes ? ?Author: ?John Hodgkins, MD ?04/18/2021 1:58 PM ? ?For on call review [email or web address].  ? ?

## 2021-04-18 NOTE — Progress Notes (Signed)
? ?  NAME:  John [name], MRN:  [id number], DOB:  [birth year], LOS: 0 ?ADMISSION DATE:  04/17/2021, CONSULTATION DATE:  04/17/2020 ?REFERRING MD:  Florencia Reasons MD, CHIEF COMPLAINT: Pneumothorax ? 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  He has had recurrent pneumothorax with chest tube x2 and will be admitted with right-sided pneumothorax ? ?Pertinent  Medical History  ? ? has a past medical history of Atrial fibrillation with RVR ([place]) (05/12/2020), Dyspnea, Family history of adverse reaction to anesthesia, GERD (gastroesophageal reflux disease), History of radiation therapy (04/24/2020-05/16/2020), PAF (paroxysmal atrial fibrillation) ([place]), Rheumatoid aortitis, Sepsis ([place]) (10/06/2020), and Squamous cell lung cancer ([place]).  ? ?Significant Hospital Events: ?Including procedures, antibiotic start and stop dates in addition to other pertinent events   ?3/3 admit ? ?Interim History / Subjective:  ? ? ?Objective   ?Blood pressure 119/76, pulse 73, temperature 97.8 ?F (36.6 ?C), temperature source Oral, resp. rate 16, SpO2 100 %. ?   ?   ? ?Intake/Output Summary (Last 24 hours) at 04/18/2021 1146 ?Last data filed at 04/18/2021 0546 ?Gross per 24 hour  ?Intake --  ?Output 550 ml  ?Net -550 ml  ? ?There were no vitals filed for this visit. ? ?Examination: ?Blood pressure 119/76, pulse 73, temperature 97.8 ?F (36.6 ?C), temperature source Oral, resp. rate 16, SpO2 100 %. ?Gen:      No acute distress ?HEENT:  EOMI, sclera anicteric ?Neck:     No masses; no thyromegaly ?Lungs:    Clear to auscultation bilaterally; normal respiratory effort ?CV:         Regular rate and rhythm; no murmurs ?Abd:      + bowel sounds; soft, non-tender; no palpable masses, no distension ?Ext:    No edema; adequate peripheral perfusion ?Skin:      Warm and dry; no rash ?Neuro: alert and oriented x  3 ?Psych: normal mood and affect  ? ?Resolved Hospital Problem list   ? ? 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 ?Patient is okay with repeat chest tube placement but since he has already had 2 if he does need a third tube then it would be good to have it done under image guidance with IR. ?I discussed with Dr. Denna Haggard the IR doctor on-call who felt that the pneumothorax is getting better and would like to avoid a procedure right now. ?Continue monitoring.  Get CT chest with there is any worsening ?Chest x-ray tomorrow ?  ?Signature:  ? ?Marshell Garfinkel MD ?[place] [facility] ?See Amion for pager ? ?If no response to pager , please call [phone] until 7pm ?After 7:00 pm call Elink  [phone] ?04/18/2021, 1:03 PM  ? ?  ?

## 2021-04-18 NOTE — Assessment & Plan Note (Addendum)
Noted, stable. ?

## 2021-04-18 NOTE — ED Notes (Signed)
Pt given cup of water and warm blanket ? ?

## 2021-04-18 NOTE — Progress Notes (Signed)
?  Transition of Care (TOC) Screening Note ? ? ?Patient Details  ?Name: John [name] ?Date of Birth: [birth year] ? ? ?Transition of Care [facility]) CM/SW Contact:    ?Dessa Phi, RN ?Phone Number: ?04/18/2021, 10:26 AM ? ? ? ?Transition of Care Department [facility]) has reviewed patient and no TOC needs have been identified at this time. We will continue to monitor patient advancement through interdisciplinary progression rounds. If new patient transition needs arise, please place a TOC consult. ?  ?

## 2021-04-18 NOTE — Assessment & Plan Note (Addendum)
--   Recurrent complicated by underlying emphysema and history of cancer. ?Currently requiring 3 L of oxygen to keep saturations greater than 95%. ?Pulmonary has been consulted, CT of chest shows small pneumothorax ?Manage conservatively, will need to follow-up with pulmonary as an outpatient in 2 to 4 weeks. ?

## 2021-04-18 NOTE — Hospital Course (Addendum)
61 year old male PMH recurrent pneumothorax, sent from pulmonary office for admission for recurrent pneumothorax. Management has been conservative per pulmonology w/ NRB for nitrogen washout initially, now on [place]. Some improvement in PTX has been seen. ?--3/7 pneumothorax stable from yesterday. Plan is to continue current treatment, check CT and hopeful for discharge tomorrow  if PTX improves. Management per pulmonology. ?

## 2021-04-18 NOTE — Plan of Care (Signed)
?  Problem: Education: ?Goal: Knowledge of General Education information will improve ?Description: Including pain rating scale, medication(s)/side effects and non-pharmacologic comfort measures ?Outcome: Progressing ?  ?Problem: Health Behavior/Discharge Planning: ?Goal: Ability to manage health-related needs will improve ?Outcome: Progressing ?  ?Problem: Nutrition: ?Goal: Adequate nutrition will be maintained ?Outcome: Progressing ?  ?Problem: Coping: ?Goal: Level of anxiety will decrease ?Outcome: Progressing ?  ?Problem: Skin Integrity: ?Goal: Risk for impaired skin integrity will decrease ?Outcome: Progressing ?  ?

## 2021-04-18 NOTE — Assessment & Plan Note (Addendum)
Follow-up with Dr. Earlie Server as an outpatient ?

## 2021-04-18 NOTE — Progress Notes (Addendum)
? ?  Received a request for possible Rt chest tube placement for recurrent Rt PTX for this pt per Dr Francia Greaves ? ?Reviewed imaging with Dr Denna Haggard ? ?Since pt is asymptomatic and PTX looking smaller on recent xrays ?Would not recommend any IR procedure at this time ?Could consider CT Chest for further evaluation if feel needed. ? ?MD spoke to Dr Denna Haggard and all are in agreement to HOLD on Chest tube for now ? ? ? ? ?

## 2021-04-19 ENCOUNTER — Observation Stay (HOSPITAL_COMMUNITY): Payer: 59

## 2021-04-19 DIAGNOSIS — D638 Anemia in other chronic diseases classified elsewhere: Secondary | ICD-10-CM | POA: Diagnosis present

## 2021-04-19 DIAGNOSIS — J939 Pneumothorax, unspecified: Secondary | ICD-10-CM | POA: Diagnosis present

## 2021-04-19 DIAGNOSIS — C7971 Secondary malignant neoplasm of right adrenal gland: Secondary | ICD-10-CM | POA: Diagnosis present

## 2021-04-19 DIAGNOSIS — Z7901 Long term (current) use of anticoagulants: Secondary | ICD-10-CM | POA: Diagnosis not present

## 2021-04-19 DIAGNOSIS — Z85828 Personal history of other malignant neoplasm of skin: Secondary | ICD-10-CM | POA: Diagnosis not present

## 2021-04-19 DIAGNOSIS — K219 Gastro-esophageal reflux disease without esophagitis: Secondary | ICD-10-CM | POA: Diagnosis present

## 2021-04-19 DIAGNOSIS — I4819 Other persistent atrial fibrillation: Secondary | ICD-10-CM | POA: Diagnosis present

## 2021-04-19 DIAGNOSIS — Z85118 Personal history of other malignant neoplasm of bronchus and lung: Secondary | ICD-10-CM | POA: Diagnosis not present

## 2021-04-19 DIAGNOSIS — Z923 Personal history of irradiation: Secondary | ICD-10-CM | POA: Diagnosis not present

## 2021-04-19 DIAGNOSIS — Z87891 Personal history of nicotine dependence: Secondary | ICD-10-CM | POA: Diagnosis not present

## 2021-04-19 DIAGNOSIS — Z7952 Long term (current) use of systemic steroids: Secondary | ICD-10-CM | POA: Diagnosis not present

## 2021-04-19 DIAGNOSIS — C3491 Malignant neoplasm of unspecified part of right bronchus or lung: Secondary | ICD-10-CM | POA: Diagnosis not present

## 2021-04-19 DIAGNOSIS — Z888 Allergy status to other drugs, medicaments and biological substances status: Secondary | ICD-10-CM | POA: Diagnosis not present

## 2021-04-19 DIAGNOSIS — J9311 Primary spontaneous pneumothorax: Secondary | ICD-10-CM | POA: Diagnosis not present

## 2021-04-19 DIAGNOSIS — Z20822 Contact with and (suspected) exposure to covid-19: Secondary | ICD-10-CM | POA: Diagnosis present

## 2021-04-19 DIAGNOSIS — J9312 Secondary spontaneous pneumothorax: Secondary | ICD-10-CM | POA: Diagnosis not present

## 2021-04-19 DIAGNOSIS — J9601 Acute respiratory failure with hypoxia: Secondary | ICD-10-CM | POA: Diagnosis not present

## 2021-04-19 DIAGNOSIS — J449 Chronic obstructive pulmonary disease, unspecified: Secondary | ICD-10-CM | POA: Diagnosis present

## 2021-04-19 DIAGNOSIS — Z9221 Personal history of antineoplastic chemotherapy: Secondary | ICD-10-CM | POA: Diagnosis not present

## 2021-04-19 DIAGNOSIS — J9811 Atelectasis: Secondary | ICD-10-CM | POA: Diagnosis present

## 2021-04-19 DIAGNOSIS — F419 Anxiety disorder, unspecified: Secondary | ICD-10-CM | POA: Diagnosis present

## 2021-04-19 DIAGNOSIS — C3490 Malignant neoplasm of unspecified part of unspecified bronchus or lung: Secondary | ICD-10-CM | POA: Diagnosis not present

## 2021-04-19 DIAGNOSIS — Z8249 Family history of ischemic heart disease and other diseases of the circulatory system: Secondary | ICD-10-CM | POA: Diagnosis not present

## 2021-04-19 DIAGNOSIS — J9381 Chronic pneumothorax: Secondary | ICD-10-CM | POA: Diagnosis not present

## 2021-04-19 DIAGNOSIS — J9383 Other pneumothorax: Secondary | ICD-10-CM | POA: Diagnosis present

## 2021-04-19 DIAGNOSIS — D649 Anemia, unspecified: Secondary | ICD-10-CM | POA: Diagnosis not present

## 2021-04-19 DIAGNOSIS — Z79899 Other long term (current) drug therapy: Secondary | ICD-10-CM | POA: Diagnosis not present

## 2021-04-19 DIAGNOSIS — M0530 Rheumatoid heart disease with rheumatoid arthritis of unspecified site: Secondary | ICD-10-CM | POA: Diagnosis present

## 2021-04-19 NOTE — Progress Notes (Signed)
?  Progress Note ? ? ?PatientOvide [name] IFO:[id number] DOB: 05-26-1960 DOA: 04/17/2021     0 ?DOS: the patient was seen and examined on 04/19/2021 ?  ?Brief hospital course: ?61 year old male PMH recurrent pneumothorax, sent from pulmonary office for admission for recurrent pneumothorax. ?--3/5 pneumothorax decreasing in size.  Pulmonology pursuing conservative management at this time.    ? ?Assessment and Plan: ?* Pneumothorax ?-- Recurrent complicated by underlying emphysema and history of cancer. ?-- Continue management per pulmonology, currently conservative as pneumothorax improving.  Continues on a nonrebreather for nitrogen washout. ? ?Stage 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 (HCC) ?--followed by Dr. Julien [name]. ? ?Normocytic anemia ?--stable, follow-up as an outpatient ? ?Persistent atrial fibrillation ([place]) ?--stable, resume apixaban when able; hold for now in case chest tube needed ?--continue Toprol-XL ? ? ? ? ? ? ? ?Subjective:  ?Feels fine ?No pain ?Breathing fine ? ?Physical Exam: ?Vitals:  ? 04/18/21 2125 04/19/21 0500 04/19/21 0831 04/19/21 1306  ?BP: 113/82 112/72  111/73  ?Pulse: 76 66  63  ?Resp:  18  18  ?Temp: 98.2 ?F (36.8 ?C) 98 ?F (36.7 ?C)  98.1 ?F (36.7 ?C)  ?TempSrc: Oral Oral  Oral  ?SpO2:  99% 99% 100%  ?Weight:      ?Height:      ? ?Physical Exam ?Vitals reviewed.  ?Constitutional:   ?   General: He is not in acute distress. ?   Appearance: He is not ill-appearing or toxic-appearing.  ?Cardiovascular:  ?   Rate and Rhythm: Normal rate and regular rhythm.  ?   Heart sounds: No murmur heard. ?Pulmonary:  ?   Effort: Pulmonary effort is normal.  ?   Breath sounds: No wheezing, rhonchi or rales.  ?Neurological:  ?   Mental Status: He is alert.  ?Psychiatric:     ?   Mood and Affect: Mood normal.     ?   Behavior: Behavior normal.  ? ? ? ?Data Reviewed: ? ? ?CXR independent review: decreasing right pneumothorax ? ?Family Communication: none ? ?Disposition: ?Status is: Observation ? ? ? ?  Planned Discharge Destination: Home ? ? ? ? ?Time spent: 20 minutes ? ?Author: ?[name], MD ?04/19/2021 1:35 PM ? ?For on call review [email or web address].  ? ?

## 2021-04-19 NOTE — Progress Notes (Signed)
? ?  NAME:  John [name], MRN:  [id number], DOB:  1961-01-11, LOS: 0 ?ADMISSION DATE:  04/17/2021, CONSULTATION DATE:  04/17/2020 ?REFERRING MD:  Florencia Reasons MD, CHIEF COMPLAINT: Pneumothorax ? 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  He has had recurrent pneumothorax with chest tube x2 and will be admitted with right-sided pneumothorax ? ?Pertinent  Medical History  ? ? has a past medical history of Atrial fibrillation with RVR ([place]) (05/12/2020), Dyspnea, Family history of adverse reaction to anesthesia, GERD (gastroesophageal reflux disease), History of radiation therapy (04/24/2020-05/16/2020), PAF (paroxysmal atrial fibrillation) ([place]), Rheumatoid aortitis, Sepsis ([place]) (10/06/2020), and Squamous cell lung cancer ([place]).  ? ?Significant Hospital Events: ?Including procedures, antibiotic start and stop dates in addition to other pertinent events   ?3/3 admit ? ?Interim History / Subjective:  ? ? ?Objective   ?Blood pressure 112/72, pulse 66, temperature 98 ?F (36.7 ?C), temperature source Oral, resp. rate 18, height 5\' 11"  (1.803 m), weight 105.7 kg, SpO2 99 %. ?   ?FiO2 (%):  [100 %] 100 %  ? ?Intake/Output Summary (Last 24 hours) at 04/19/2021 0924 ?Last data filed at 04/19/2021 7510 ?Gross per 24 hour  ?Intake 840 ml  ?Output 2250 ml  ?Net -1410 ml  ? ?Filed Weights  ? 04/18/21 1319  ?Weight: 105.7 kg  ? ? ?Examination: ?Blood pressure 112/72, pulse 66, temperature 98 ?F (36.7 ?C), temperature source Oral, resp. rate 18, height 5\' 11"  (1.803 m), weight 105.7 kg, SpO2 99 %. ?Gen:      No acute distress ?HEENT:  EOMI, sclera anicteric ?Neck:     No masses; no thyromegaly ?Lungs:    Clear to auscultation bilaterally; normal respiratory effort ?CV:         Regular rate and rhythm; no murmurs ?Abd:      + bowel sounds; soft, non-tender; no palpable masses, no  distension ?Ext:    No edema; adequate peripheral perfusion ?Skin:      Warm and dry; no rash ?Neuro: alert and oriented x 3 ?Psych: normal mood and affect  ? ?Chest x-ray today reviewed with resolving pneumothorax ? ?Resolved Hospital Problem list   ? ? 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 ?Chest x-ray shows that pneumothorax is getting smaller.  Will avoid chest tube for now and continue conservative management ?Follow chest x ray ?  ?Signature:  ? ?Marshell Garfinkel MD ?[facility] ?See Amion for pager ? ?If no response to pager , please call [phone] until 7pm ?After 7:00 pm call Elink  [phone] ?04/19/2021, 9:24 AM  ? ?  ?

## 2021-04-20 ENCOUNTER — Ambulatory Visit: Payer: 59

## 2021-04-20 ENCOUNTER — Inpatient Hospital Stay (HOSPITAL_COMMUNITY): Payer: 59

## 2021-04-20 DIAGNOSIS — C3490 Malignant neoplasm of unspecified part of unspecified bronchus or lung: Secondary | ICD-10-CM | POA: Diagnosis not present

## 2021-04-20 DIAGNOSIS — J939 Pneumothorax, unspecified: Secondary | ICD-10-CM | POA: Diagnosis not present

## 2021-04-20 DIAGNOSIS — Z87891 Personal history of nicotine dependence: Secondary | ICD-10-CM | POA: Diagnosis not present

## 2021-04-20 DIAGNOSIS — I4819 Other persistent atrial fibrillation: Secondary | ICD-10-CM

## 2021-04-20 DIAGNOSIS — C3491 Malignant neoplasm of unspecified part of right bronchus or lung: Secondary | ICD-10-CM

## 2021-04-20 MED ORDER — ENOXAPARIN SODIUM 40 MG/0.4ML IJ SOSY
40.0000 mg | PREFILLED_SYRINGE | INTRAMUSCULAR | Status: DC
  Administered 2021-04-20 – 2021-04-22 (×3): 40 mg via SUBCUTANEOUS
  Filled 2021-04-20 (×3): qty 0.4

## 2021-04-20 NOTE — Plan of Care (Signed)
  Problem: Education: Goal: Knowledge of General Education information will improve Description: Including pain rating scale, medication(s)/side effects and non-pharmacologic comfort measures Outcome: Progressing   Problem: Clinical Measurements: Goal: Diagnostic test results will improve Outcome: Progressing   Problem: Activity: Goal: Risk for activity intolerance will decrease Outcome: Progressing   

## 2021-04-20 NOTE — Consult Note (Signed)
Referral MD  Reason for Referral: Recurrent right pneumothorax; metastatic squamous cell carcinoma of the lung  Chief Complaint  Patient presents with   partial lung collapse  : My right lung did not expand.  HPI: [name] is well-known to me.  He is very nice 61 year old white male.  He has metastatic non-small cell lung cancer.  He has squamous cell carcinoma of the lung.  He has been on chemotherapy with Taxotere.  Unfortunate, has been quite a while since he had Taxotere.  The last dose was on 03/13/2021.  He has had problems with pneumothorax.  This been a right pneumothorax.  He had a spontaneous pneumothorax I think back in February.  He has a chest tube in.  He subsequently went home.  He was seen last week by pulmonary medicine.  The chest x-ray showed worsening of the right pneumothorax.  As such, he was put back in the hospital.  A chest tube was not placed.  He had a chest x-ray I think yesterday which did show that the pneumothorax was improving.  He does not seem short of breath.  He is wearing a nonrebreather oxygen mask.  On 4 March, his labs showed a white count 6.1.  Hemoglobin 10.3.  Platelet count 279,000.  His electrolytes all were okay.  He does have atrial fibrillation.  On the monitor, it looks like he has sinus rhythm.  Again does not have any problems with pain.  He feels okay.  He is eating okay.  He has had no nausea or vomiting.  There is no headache.  His last CT scan was back in mid February.  It showed that he was responding.  He is having no problems with bowels or bladder.  He is having no nausea or vomiting.  There is no bleeding.  He is having no headache.  Overall, his performance status is ECOG 1.  Past Medical History:  Diagnosis Date   Atrial fibrillation with RVR (HCC) 05/12/2020   Dyspnea    Family history of adverse reaction to anesthesia    brother with seizures had episode under anesthesia.  Patient has seizures as well.   GERD  (gastroesophageal reflux disease)    History of radiation therapy 04/24/2020-05/16/2020   IMRT to right lung     Dr Antony Blackbird   PAF (paroxysmal atrial fibrillation) (HCC)    CHADS2VSAC score 0   Rheumatoid aortitis    Sepsis (HCC) 10/06/2020   Squamous cell lung cancer (HCC)   :   Past Surgical History:  Procedure Laterality Date   BRONCHIAL BRUSHINGS  03/27/2020   Procedure: BRONCHIAL BRUSHINGS;  Surgeon: Leslye Peer, MD;  Location: [facility] ENDOSCOPY;  Service: Cardiopulmonary;;   BUBBLE STUDY  10/13/2020   Procedure: BUBBLE STUDY;  Surgeon: Chrystie Nose, MD;  Location: [facility] ENDOSCOPY;  Service: Cardiovascular;;   CARDIOVERSION N/A 10/13/2020   Procedure: CARDIOVERSION;  Surgeon: Chrystie Nose, MD;  Location: [facility] ENDOSCOPY;  Service: Cardiovascular;  Laterality: N/A;   CATARACT EXTRACTION  2016   at [facility]   FINE NEEDLE ASPIRATION  03/27/2020   Procedure: FINE NEEDLE ASPIRATION;  Surgeon: Leslye Peer, MD;  Location: [facility] ENDOSCOPY;  Service: Cardiopulmonary;;   HIP SURGERY Left    TEE WITHOUT CARDIOVERSION N/A 10/13/2020   Procedure: TRANSESOPHAGEAL ECHOCARDIOGRAM (TEE);  Surgeon: Chrystie Nose, MD;  Location: [facility] ENDOSCOPY;  Service: Cardiovascular;  Laterality: N/A;   VIDEO BRONCHOSCOPY WITH ENDOBRONCHIAL ULTRASOUND N/A 03/27/2020   Procedure: VIDEO BRONCHOSCOPY WITH ENDOBRONCHIAL  ULTRASOUND;  Surgeon: Leslye Peer, MD;  Location: [facility] ENDOSCOPY;  Service: Cardiopulmonary;  Laterality: N/A;  :   Current Facility-Administered Medications:    acetaminophen (TYLENOL) tablet 650 mg, 650 mg, Oral, Q6H PRN **OR** acetaminophen (TYLENOL) suppository 650 mg, 650 mg, Rectal, Q6H PRN, Ronaldo Miyamoto, Tyrone A, DO   heparin injection 5,000 Units, 5,000 Units, Subcutaneous, Q8H, Standley Brooking, MD, 5,000 Units at 04/20/21 0539   HYDROcodone-acetaminophen (NORCO/VICODIN) 5-325 MG per tablet 1-2 tablet, 1-2 tablet, Oral, Q4H PRN, Ronaldo Miyamoto, Tyrone A, DO   levalbuterol (XOPENEX) nebulizer solution 0.63  mg, 0.63 mg, Nebulization, Q6H PRN, Ronaldo Miyamoto, Tyrone A, DO   LORazepam (ATIVAN) tablet 0.5 mg, 0.5 mg, Oral, Q6H PRN, Standley Brooking, MD   metoprolol succinate (TOPROL-XL) 24 hr tablet 25 mg, 25 mg, Oral, QHS, Standley Brooking, MD, 25 mg at 04/19/21 2201   morphine (PF) 2 MG/ML injection 2 mg, 2 mg, Intravenous, Q2H PRN, Ronaldo Miyamoto, Tyrone A, DO   ondansetron (ZOFRAN) tablet 4 mg, 4 mg, Oral, Q6H PRN **OR** ondansetron (ZOFRAN) injection 4 mg, 4 mg, Intravenous, Q6H PRN, Ronaldo Miyamoto, Tyrone A, DO   pantoprazole (PROTONIX) EC tablet 40 mg, 40 mg, Oral, QHS, Standley Brooking, MD, 40 mg at 04/19/21 2201   PARoxetine (PAXIL) tablet 10 mg, 10 mg, Oral, QHS, Standley Brooking, MD, 10 mg at 04/19/21 2201   predniSONE (DELTASONE) tablet 5 mg, 5 mg, Oral, Daily, Standley Brooking, MD, 5 mg at 04/19/21 0981   prochlorperazine (COMPAZINE) tablet 10 mg, 10 mg, Oral, Q6H PRN, Standley Brooking, MD   umeclidinium-vilanterol (ANORO ELLIPTA) 62.5-25 MCG/ACT 1 puff, 1 puff, Inhalation, Daily, Standley Brooking, MD, 1 puff at 04/19/21 0831:   heparin injection (subcutaneous)  5,000 Units Subcutaneous Q8H   metoprolol succinate  25 mg Oral QHS   pantoprazole  40 mg Oral QHS   PARoxetine  10 mg Oral QHS   predniSONE  5 mg Oral Daily   umeclidinium-vilanterol  1 puff Inhalation Daily  :   Allergies  Allergen Reactions   Albuterol Other (See Comments)    Patient has had episode of atrial fib following administration of albuterol (tolerates Xopenex well)   Atorvastatin Other (See Comments)    Cannot take at this time  :   Family History  Problem Relation Age of Onset   Arrhythmia Mother        has PPM   Heart disease Father        Died at 72, started in his 31s, heart attacks, had PPM and ICD  :   Social History   Socioeconomic History   Marital status: Married    Spouse name: Not on file   Number of children: Not on file   Years of education: Not on file   Highest education level: Not on file   Occupational History   Not on file  Tobacco Use   Smoking status: Former    Packs/day: 1.00    Years: 30.00    Pack years: 30.00    Types: Cigarettes    Quit date: 2018    Years since quitting: 5.1   Smokeless tobacco: Never   Tobacco comments:    Former smoker 10/28/2020  Vaping Use   Vaping Use: Never used  Substance and Sexual Activity   Alcohol use: Not Currently   Drug use: Never   Sexual activity: Not on file  Other Topics Concern   Not on file  Social History Narrative   ** Merged History  Encounter **       Social Determinants of Health   Financial Resource Strain: Not on file  Food Insecurity: Not on file  Transportation Needs: Not on file  Physical Activity: Not on file  Stress: Not on file  Social Connections: Not on file  Intimate Partner Violence: Not on file  :  Review of Systems  Constitutional: Negative.   HENT: Negative.    Eyes: Negative.   Respiratory:  Positive for shortness of breath.   Cardiovascular: Negative.   Gastrointestinal: Negative.   Genitourinary: Negative.   Musculoskeletal: Negative.   Skin: Negative.   Neurological: Negative.   Endo/Heme/Allergies: Negative.   Psychiatric/Behavioral: Negative.      Exam: Patient Vitals for the past 24 hrs:  BP Temp Temp src Pulse Resp SpO2  04/20/21 0439 101/73 97.7 F (36.5 C) Oral 71 20 100 %  04/19/21 2159 116/76 97.7 F (36.5 C) Oral 73 20 100 %  04/19/21 1306 111/73 98.1 F (36.7 C) Oral 63 18 100 %  04/19/21 0831 -- -- -- -- -- 99 %   .Physical Exam Vitals reviewed.  HENT:     Head: Normocephalic and atraumatic.  Eyes:     Pupils: Pupils are equal, round, and reactive to light.  Cardiovascular:     Rate and Rhythm: Normal rate and regular rhythm.     Heart sounds: Normal heart sounds.  Pulmonary:     Effort: Pulmonary effort is normal.     Breath sounds: Normal breath sounds.  Abdominal:     General: Bowel sounds are normal.     Palpations: Abdomen is soft.   Musculoskeletal:        General: No tenderness or deformity. Normal range of motion.     Cervical back: Normal range of motion.  Lymphadenopathy:     Cervical: No cervical adenopathy.  Skin:    General: Skin is warm and dry.     Findings: No erythema or rash.  Neurological:     Mental Status: He is alert and oriented to person, place, and time.  Psychiatric:        Behavior: Behavior normal.        Thought Content: Thought content normal.        Judgment: Judgment normal.    Recent Labs    04/17/21 1310 04/18/21 0549  WBC 6.3 6.1  HGB 10.9* 10.3*  HCT 33.8* 33.0*  PLT 321 279    Recent Labs    04/17/21 1310 04/18/21 0549  NA 135 134*  K 4.2 3.6  CL 101 99  CO2 27 27  GLUCOSE 107* 94  BUN 9 10  CREATININE 1.15 1.16  CALCIUM 8.2* 8.6*    Blood smear review: None  Pathology: None    Assessment and Plan: [name] is a very nice 61 year old white male.  He has metastatic none small cell lung cancer.  He has been on chemotherapy with Taxotere.  He has a recurrent right pneumothorax.  This appears to be getting better.  Hopefully, he will be able to go home soon.  He does not have a chest tube in which I would imagine is a good thing.  His heart monitor looks okay without any obvious atrial fibrillation.  We will just have to wait for him to get home before we can treat him again.  Hopefully, we will not find that his cancer is progressing.  I know that he will get incredible care from everybody up on 4 E.  Christin Bach, MD  [facility]o 1:37

## 2021-04-20 NOTE — TOC Progression Note (Signed)
Transition of Care (TOC) - Progression Note  ? ? ?Patient Details  ?Name: Ericson Nafziger ?MRN: [id number] ?Date of Birth: May 26, 1960 ? ?Transition of Care (TOC) CM/SW Contact  ?Ross Ludwig, LCSW ?Phone Number: ?04/20/2021, 3:54 PM ? ?Clinical Narrative:    ? ?CSW was informed that patient is on chronic oxygen at home.  No anticipated needs for discharge, TOC to continue to follow. ? ? ?  ?  ? ?Expected Discharge Plan and Services ?  ?  ?  ?  ?  ?                ?  ?  ?  ?  ?  ?  ?  ?  ?  ?  ? ? ?Social Determinants of Health (SDOH) Interventions ?  ? ?Readmission Risk Interventions ?No flowsheet data found. ? ?

## 2021-04-20 NOTE — Progress Notes (Signed)
? ?  NAME:  Rolfe Hartsell, MRN:  [id number], DOB:  03-Feb-1961, LOS: 1 ?ADMISSION DATE:  04/17/2021, CONSULTATION DATE:  04/17/2020 ?REFERRING MD:  Florencia Reasons MD, CHIEF COMPLAINT: Pneumothorax ? 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  He has had recurrent pneumothorax with chest tube x2 and will be admitted with right-sided pneumothorax. ? ?Pertinent Medical History:  ? ? has a past medical history of Atrial fibrillation with RVR ([place]) (05/12/2020), Dyspnea, Family history of adverse reaction to anesthesia, GERD (gastroesophageal reflux disease), History of radiation therapy (04/24/2020-05/16/2020), PAF (paroxysmal atrial fibrillation) ([place]), Rheumatoid aortitis, Sepsis ([place]) (10/06/2020), and Squamous cell lung cancer ([place]).  ? ?Significant Hospital Events: ?Including procedures, antibiotic start and stop dates in addition to other pertinent events   ?3/3 Admit ?PCCM consulted for assistance with PTX management ? ?Interim History / Subjective:  ?No significant events overnight ?Feeling well, states SOB is significantly improved ?Ambulated to restroom without any difficulty, which is much better than prior ?Remains on NRB, transition to [place] ?CXR stable with small R PTX, repeat in AM ? ?Objective:  ?Blood pressure 101/73, pulse 71, temperature 97.7 ?F (36.5 ?C), temperature source Oral, resp. rate 20, height 5\' 11"  (1.803 m), weight 105.7 kg, SpO2 100 %. ?   ?   ? ?Intake/Output Summary (Last 24 hours) at 04/20/2021 1227 ?Last data filed at 04/20/2021 0736 ?Gross per 24 hour  ?Intake 720 ml  ?Output 2725 ml  ?Net -2005 ml  ? ? ?Filed Weights  ? 04/18/21 1319  ?Weight: 105.7 kg  ? ?Physical Examination: ?General: Overall well-appearing middle-aged man in NAD. Working on his computer. ?HEENT: [place]/AT, anicteric sclera, PERRL, moist mucous membranes. NRB mask in place. ?Neuro:  Awake, oriented x 4. Responds to verbal stimuli. Following commands consistently. Moves all 4 extremities spontaneously. ?CV: RRR, no m/g/r. ?PULM: Breathing even and unlabored on NRB 15L. Lung fields CTAB. ?GI: Soft, nontender, nondistended. Normoactive bowel sounds. ?Extremities: No LE edema noted. RUE with small amount of residual subcutaneous emphysema around shoulder, resolving. ?Skin: Warm/dry, no rashes. ? ?Resolved Hospital Problem List   ? ? 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 ?- CXR demonstrates stable/slightly improved R PTX ?- Continue conservative management ?- Remains on NRB, transition to [place] and monitor closely ?- Return to NRB if increased SOB or not maintaining O2 sats ?- Repeat CXR 3/7AM ? ?Signature:  ? ?Lestine Mount, PA-C ?[facility] ?04/20/21 12:31 PM ? ?Please see [email or web address] for pager details. ? ?From 7A-7P if no response, please call [phone] ?After hours, please call ELink [phone] ?

## 2021-04-20 NOTE — Progress Notes (Signed)
?  Progress Note ? ? ?PatientJarett [name] AES:[id number] DOB: 20-Jul-1960 DOA: 04/17/2021     1 ?DOS: the patient was seen and examined on 04/20/2021 ?  ?Brief hospital course: ?61 year old male PMH recurrent pneumothorax, sent from pulmonary office for admission for recurrent pneumothorax. Management has been conservative per pulmonology w/ NRB for nitrogen washout. Some improvement in PTX has been seen. ?--3/6 pneumothorax stable from yesterday. Plan is to continue current treatment, hopeful for discharge soon if PTX improves. Management per pulmonology. ? ?Assessment and Plan: ?* Pneumothorax ?-- Recurrent complicated by underlying emphysema and history of cancer. ?-- Continue management per pulmonology, currently conservative as pneumothorax improving.  Continues on a nonrebreather for nitrogen washout. ? ?Stage 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 (HCC) ?--followed by Dr. [name]. ? ?Normocytic anemia ?--stable, follow-up as an outpatient ? ?Persistent atrial fibrillation ([place]) ?--stable, resume apixaban when able; hold for now in case chest tube needed ?--continue Toprol-XL ? ? ? ? ?  ? ?Subjective:  ?Feels good ?Up to bathroom without SOB which is best he has felt in awhile ? ?Physical Exam: ?Vitals:  ? 04/19/21 1306 04/19/21 2159 04/20/21 0439 04/20/21 0801  ?BP: 111/73 116/76 101/73   ?Pulse: 63 73 71   ?Resp: 18 20 20    ?Temp: 98.1 ?F (36.7 ?C) 97.7 ?F (36.5 ?C) 97.7 ?F (36.5 ?C)   ?TempSrc: Oral Oral Oral   ?SpO2: 100% 100% 100% 100%  ?Weight:      ?Height:      ? ?Physical Exam ?Vitals reviewed.  ?Constitutional:   ?   General: He is not in acute distress. ?   Appearance: He is not ill-appearing or toxic-appearing.  ?Cardiovascular:  ?   Rate and Rhythm: Normal rate and regular rhythm.  ?   Heart sounds: No murmur heard. ?Pulmonary:  ?   Effort: Pulmonary effort is normal. No respiratory distress.  ?   Breath sounds: No wheezing, rhonchi or rales.  ?Neurological:  ?   Mental Status: He is alert.   ?Psychiatric:     ?   Mood and Affect: Mood normal.     ?   Behavior: Behavior normal.  ? ? ?Data Reviewed: ? ?CXR independent review, stable small right PTX ? ?Family Communication: none ? ?Disposition: ?Status is: Inpatient ?Remains inpatient appropriate because: persistent pneumothorax ? Planned Discharge Destination: Home ? ? ? ?Time spent: 20 minutes ? ?Author: ?[name], MD ?04/20/2021 12:28 PM ? ?For on call review [email or web address].  ?

## 2021-04-21 ENCOUNTER — Inpatient Hospital Stay (HOSPITAL_COMMUNITY): Payer: 59

## 2021-04-21 ENCOUNTER — Encounter (HOSPITAL_COMMUNITY): Payer: Self-pay | Admitting: Internal Medicine

## 2021-04-21 DIAGNOSIS — J9311 Primary spontaneous pneumothorax: Secondary | ICD-10-CM

## 2021-04-21 DIAGNOSIS — J939 Pneumothorax, unspecified: Secondary | ICD-10-CM | POA: Diagnosis not present

## 2021-04-21 DIAGNOSIS — C3491 Malignant neoplasm of unspecified part of right bronchus or lung: Secondary | ICD-10-CM | POA: Diagnosis not present

## 2021-04-21 DIAGNOSIS — I4819 Other persistent atrial fibrillation: Secondary | ICD-10-CM | POA: Diagnosis not present

## 2021-04-21 NOTE — Progress Notes (Signed)
? ?  NAME:  John [name], MRN:  [id number], DOB:  [birth year], LOS: 2 ?ADMISSION DATE:  04/17/2021, CONSULTATION DATE:  04/17/2020 ?REFERRING MD:  Florencia Reasons MD, CHIEF COMPLAINT: Pneumothorax ? 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  He has had recurrent pneumothorax with chest tube x2 and will be admitted with right-sided pneumothorax. ? ?Pertinent Medical History:  ?Atrial Fibrillation  ?GERD ?Rheumatoid Aortitis  ?Squamous Cell Lung Cancer  ? ?Significant Hospital Events: ?Including procedures, antibiotic start and stop dates in addition to other pertinent events   ?3/3 Admit ?3/3 PCCM consulted for assistance with PTX management ?3/6 CXR stable with small R PTX, pt reports dyspnea improved ?3/7 CXR improved   ? ?Interim History / Subjective:  ?Afebrile  ?[place] 5L > weaned to 3L with RN in room ?Pt reports feeling much better. Notes swelling in RUE (sub-q air) is improved.  Reports dry, non-productive cough.   ? ?Objective:  ?Blood pressure 108/71, pulse 68, temperature 97.7 ?F (36.5 ?C), temperature source Oral, resp. rate 20, height 5\' 11"  (1.803 m), weight 105.7 kg, SpO2 91 %. ?   ?   ? ?Intake/Output Summary (Last 24 hours) at 04/21/2021 0932 ?Last data filed at 04/20/2021 2101 ?Gross per 24 hour  ?Intake 100 ml  ?Output --  ?Net 100 ml  ? ?Filed Weights  ? 04/18/21 1319  ?Weight: 105.7 kg  ? ?Physical Examination: ?General: pleasant, adult male lying in bed in NAD ?HEENT: [place] pink/moist, [place] O2, anicteric  ?Neuro: AAOx4, speech clear, MAE  ?CV: s1s2 RRR, no m/r/g ?PULM: non-labored at rest, crackles on right, diminished on left ?GI: soft, bsx4 active  ?Extremities: warm/dry, no edema  ?Skin: no rashes or lesions ? ?Resolved Hospital Problem List   ? ? ?Assessment & Plan:  ? ?Recurrent Pneumothorax ?History of stage IV squamous cell cancer ?Keytruda pneumonitis in  the past ? ?-CXR review 3/7 shows improvement in ptx, residual small pneumothorax but improved from prior imaging ?-follow up CT chest w/o contrast to evaluate residual PTX  ?-pt wishes to continue on conservative management path ?-wean O2 off for sats >90% ?-pending CT and patient clinical status, may be able to go home from pulmonary standpoint in am 3/8  ?-will need outpatient pulmonary follow up with CXR post discharge ? ?Signature:  ? ?Noe Gens, MSN, APRN, NP-C, AGACNP-BC ?[facility] ?04/21/2021, 9:32 AM ? ? ?Please see [email or web address] for pager details.  ? ?From 7A-7P if no response, please call [phone] ?After hours, please call ELink [phone] ? ?

## 2021-04-21 NOTE — Plan of Care (Signed)

## 2021-04-21 NOTE — Progress Notes (Signed)
End of shift ? ?Pt had chest CT without contrast.  IMPRESSION: ?Trace residual right-sided pneumothorax anteriorly. Trace right ?pleural effusion. ?  ?Stable chronic pulmonary parenchymal changes of radiation treatment ?and fibrosis with cystic change in the right mid to upper lung. ?  ?Aortic Atherosclerosis (ICD10-I70.0) and Emphysema (ICD10-J43.9). ? ?Anticipate restarting pt's home dose of xarelto and d/c 3/8.  ?

## 2021-04-21 NOTE — Progress Notes (Signed)
?  Progress Note ? ? ?PatientNichollas [name] DBZ:[id number] DOB: 08-21-1960 DOA: 04/17/2021     2 ?DOS: the patient was seen and examined on 04/21/2021 ?  ?Brief hospital course: ?61 year old male PMH recurrent pneumothorax, sent from pulmonary office for admission for recurrent pneumothorax. Management has been conservative per pulmonology w/ NRB for nitrogen washout initially, now on [place]. Some improvement in PTX has been seen. ?--3/7 pneumothorax stable from yesterday. Plan is to continue current treatment, check CT and hopeful for discharge tomorrow  if PTX improves. Management per pulmonology. ? ?Assessment and Plan: ?* Pneumothorax ?-- Recurrent complicated by underlying emphysema and history of cancer. ?-- Continue management per pulmonology, currently conservative as pneumothorax improving.  Initially treated w/ nonrebreather for nitrogen washout, now on [place] ?--CT shows small PTX ?--hopefully home 3/8. ? ?Stage 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 (HCC) ?--followed by Dr. [name]. ? ?Normocytic anemia ?--stable, follow-up as an outpatient ? ?Persistent atrial fibrillation ([place]) ?--stable, resume apixaban when able; hold for now in case chest tube needed, can likely start tomorrow ?--continue Toprol-XL ? ? ? ? ?  ? ?Subjective:  ?Feels good ?Breathing fine ?Ambulating to bathroom fine ? ?Physical Exam: ?Vitals:  ? 04/21/21 0820 04/21/21 0943 04/21/21 0944 04/21/21 1344  ?BP:    104/62  ?Pulse:    70  ?Resp:    18  ?Temp:    98.1 ?F (36.7 ?C)  ?TempSrc:    Oral  ?SpO2: 91% 98% 98% 95%  ?Weight:      ?Height:      ? ?Physical Exam ?Vitals reviewed.  ?Constitutional:   ?   General: He is not in acute distress. ?   Appearance: He is not ill-appearing or toxic-appearing.  ?Cardiovascular:  ?   Rate and Rhythm: Normal rate and regular rhythm.  ?   Heart sounds: No murmur heard. ?Pulmonary:  ?   Effort: Pulmonary effort is normal. No respiratory distress.  ?   Breath sounds: No wheezing, rhonchi or rales.  ?Neurological:   ?   Mental Status: He is alert.  ?Psychiatric:     ?   Mood and Affect: Mood normal.     ?   Behavior: Behavior normal.  ? ? ?Data Reviewed: ? ?CT trace right-sided PTX ?CXR similar small right PTX ? ?Family Communication: none ? ?Disposition: ?Status is: Inpatient ?Remains inpatient appropriate because: right-sided pneumothorax ? Planned Discharge Destination: Home ? ? ? ?Time spent: 15 minutes ? ?Author: ?[name], MD ?04/21/2021 2:26 PM ? ?For on call review [email or web address].  ?

## 2021-04-22 ENCOUNTER — Telehealth: Payer: Self-pay | Admitting: Pulmonary Disease

## 2021-04-22 ENCOUNTER — Inpatient Hospital Stay (HOSPITAL_COMMUNITY): Payer: 59

## 2021-04-22 DIAGNOSIS — J9601 Acute respiratory failure with hypoxia: Secondary | ICD-10-CM

## 2021-04-22 DIAGNOSIS — D649 Anemia, unspecified: Secondary | ICD-10-CM

## 2021-04-22 NOTE — Telephone Encounter (Signed)
Patient is scheduled for 05/05/2021 with Rexene Edison, NP (1st available opening within time frame). Mailed appointment reminder. ? ?

## 2021-04-22 NOTE — Progress Notes (Signed)
?  Progress Note ? ? ?PatientShandon [name] TOI:[id number] DOB: [birth year] DOA: 04/17/2021     3 ?DOS: the patient was seen and examined on 04/22/2021 ?  ?Brief hospital course: ?61 year old male PMH recurrent pneumothorax, sent from pulmonary office for admission for recurrent pneumothorax. Management has been conservative per pulmonology w/ NRB for nitrogen washout initially, now on [place]. Some improvement in PTX has been seen. ?--3/7 pneumothorax stable from yesterday. Plan is to continue current treatment, check CT and hopeful for discharge tomorrow  if PTX improves. Management per pulmonology. ? ?Assessment and Plan: ?* Pneumothorax ?-- Recurrent complicated by underlying emphysema and history of cancer. ?-- Continue management per pulmonology, currently conservative as pneumothorax improving.  Initially treated w/ nonrebreather for nitrogen washout, now on [place] ?--CT shows small PTX ?--hopefully home 3/8. ? ?Stage 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 (HCC) ?--followed by Dr. [name]. ? ?Normocytic anemia ?--stable, follow-up as an outpatient ? ?Persistent atrial fibrillation ([place]) ?--stable, resume apixaban when able; hold for now in case chest tube needed, can likely start tomorrow ?--continue Toprol-XL ? ? ? ? ?  ? ?Subjective:  ?No complaints feels great this morning. ? ?Physical Exam: ?Vitals:  ? 04/21/21 0944 04/21/21 1344 04/21/21 2140 04/22/21 0619  ?BP:  104/62 112/75 92/65  ?Pulse:  70 75 77  ?Resp:  18 18 16   ?Temp:  98.1 ?F (36.7 ?C) 98 ?F (36.7 ?C) 98.2 ?F (36.8 ?C)  ?TempSrc:  Oral Oral Oral  ?SpO2: 98% 95% 97% 96%  ?Weight:      ?Height:      ? ?General exam: In no acute distress. ?Respiratory system: Good air movement and clear to auscultation. ?Cardiovascular system: S1 & S2 heard, RRR. No JVD. ?Gastrointestinal system: Abdomen is nondistended, soft and nontender.  ?Extremities: No pedal edema. ?Skin: No rashes, lesions or ulcers ?Psychiatry: Judgement and insight appear normal. Mood & affect  appropriate. ?Data Reviewed: ? ?There are no new results to review at this time. ? ?Family Communication: None ? ?Disposition: ?Status is: Inpatient ?Remains inpatient appropriate because: Recent pneumothorax ? Planned Discharge Destination: Home ? ? ? ?Time spent: 35 minutes ? ?Author: ?Charlynne Cousins, MD ?04/22/2021 8:07 AM ? ?For on call review [email or web address].  ?

## 2021-04-22 NOTE — Plan of Care (Signed)

## 2021-04-22 NOTE — Progress Notes (Signed)
[name] is feeling okay.  He had the CT of the chest yesterday.  This showed trace right pneumothorax.  There is trace right pleural effusion. ? ?Hopefully, he will be able to go home today. ? ?He has had no chest pain.  He has had no cough or shortness of breath.  I think he is still wearing his nasal cannula oxygen. ? ?His appetite is doing okay.  He has had no nausea or vomiting.  He has had no fever.  There is been no bleeding. ? ?His vital signs were temperature of 98.2.  Pulse 77.  Blood pressure 92/65.  Oxygen saturation is 96%.  His head neck exam shows no ocular or oral lesions.  He has no thrush.  Lungs show good breath sounds bilaterally.  He has no wheezes.  Cardiac exam regular rate and rhythm.  Abdomen is soft.  He has decent bowel sounds.  Extremity shows no clubbing, cyanosis or edema. ? ?Hopefully, [name] will be able to go home today.  Hopefully, this pneumothorax will continue to heal up.  I suppose that he could always have little bit of the pneumothorax. ? ?We will follow him up as an outpatient.  We will continue to try and treat him with chemotherapy.  I think chemotherapy has been working and that the CT scan did not seem to show anything in the chest that look like progression.  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 ? ?John Haw, MD ? ?[place] 7:7-8 ?

## 2021-04-22 NOTE — Progress Notes (Signed)
SATURATION QUALIFICATIONS: (This note is used to comply with regulatory documentation for home oxygen) ? ?Patient Saturations on Room Air at Rest = 94% ? ?Patient Saturations on Room Air while Ambulating = 85% ? ?Patient Saturations on 2 Liters of oxygen while Ambulating = 94% ? ?Please briefly explain why patient needs home oxygen: ? ?Pt already wears oxygen at home when ambulating. ?

## 2021-04-22 NOTE — Progress Notes (Signed)
Pt O2 sats 94% on room air while sitting in bed.  Dropped to 85% while ambulating.  Placed 3L on pt to recover.  Pt stated he wears 3L at home when ambulating.  Once Pt's O2 returned to 94, we resumed ambulating.  Turned Pt's O2 down to 2 L while ambulating and Pt remained at 94 and above.  Continued ambulating and turned pt's O2 to 1L and O2 dropped to 88 while ambulating.  Returned to room, pt sat on bed, O2 sats at 94% on RA. ?

## 2021-04-22 NOTE — Progress Notes (Signed)
? ?  NAME:  John [name], MRN:  [id number], DOB:  1960/05/22, LOS: 3 ?ADMISSION DATE:  04/17/2021, CONSULTATION DATE:  04/17/2020 ?REFERRING MD:  Florencia Reasons MD, CHIEF COMPLAINT: Pneumothorax ? 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  He has had recurrent pneumothorax with chest tube x2 and will be admitted with right-sided pneumothorax. ? ?Pertinent Medical History:  ?Atrial Fibrillation  ?GERD ?Rheumatoid Aortitis  ?Squamous Cell Lung Cancer  ? ?Significant Hospital Events: ?Including procedures, antibiotic start and stop dates in addition to other pertinent events   ?3/3 Admit ?3/3 PCCM consulted for assistance with PTX management.  Patient was reluctant to get after chest tube and wanted conservative management ?3/6 CXR stable with small R PTX, pt reports dyspnea improved ?3/7 CT improved   ? ?Interim History / Subjective:  ? ?Feels well.  On room air with no complaints ? ?Objective:  ?Blood pressure 92/65, pulse 77, temperature 98.2 ?F (36.8 ?C), temperature source Oral, resp. rate 16, height 5\' 11"  (1.803 m), weight 105.7 kg, SpO2 92 %. ?   ?   ? ?Intake/Output Summary (Last 24 hours) at 04/22/2021 1029 ?Last data filed at 04/21/2021 2134 ?Gross per 24 hour  ?Intake 240 ml  ?Output --  ?Net 240 ml  ? ?Filed Weights  ? 04/18/21 1319  ?Weight: 105.7 kg  ? ?Physical Examination: ?Gen:      No acute distress ?HEENT:  EOMI, sclera anicteric ?Neck:     No masses; no thyromegaly ?Lungs:    Clear to auscultation bilaterally; normal respiratory effort ?CV:         Regular rate and rhythm; no murmurs ?Abd:      + bowel sounds; soft, non-tender; no palpable masses, no distension ?Ext:    No edema; adequate peripheral perfusion ?Skin:      Warm and dry; no rash ?Neuro: alert and oriented x 3 ?Psych: normal mood and affect  ? ?Resolved Hospital Problem List   ? ? ?Assessment  & Plan:  ? 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 ?CT scan yesterday reviewed with small loculated pneumothorax and chest x-ray today is stable ?Does not need chest tube and will continue with conservative management ?He is doing well on room air ?Can be discharged today.  Will make follow-up in clinic for repeat chest x-ray. ? ?Signature:  ? ?Marshell Garfinkel MD ?[facility] ?See Amion for pager ? ?If no response to pager , please call [phone] until 7pm ?After 7:00 pm call Elink  [phone] ?04/22/2021, 10:33 AM  ? ?

## 2021-04-22 NOTE — Progress Notes (Signed)
End of shift ? ?Pt participated in an O2 determination per order although pt currently has oxygen at home for ambulating.  Pt had a chest xray today.  Pt anticipated discharging home today per conversation with the doctor earlier but no discharge orders at this time.  Anticipate d/c 3/9 ?

## 2021-04-23 NOTE — Progress Notes (Signed)
Overall, [name] feels well.  He would like to go home today.  He seems to have pretty decent oxygen levels.  He is having no problems with nausea or vomiting.  There is no change in bowel or bladder habits.  He has had no bleeding. ? ?He ambulated yesterday.  He maintained his oxygen levels pretty well.  I think we will probably need to be on some oxygen at home. ? ?He has had no palpitations.  He has had no cough. ? ?He had a chest x-ray yesterday.  This showed a trace pneumothorax on the right. ? ?Vital signs show temperature 98.2.  Pulse 74.  Blood pressure 95/69.  His head neck exam shows no mucositis.  There is no thrush.  His lungs sound clear bilaterally.  He has good air movement bilaterally.  Cardiac exam regular rate and rhythm.  I really do not hear an erratic heart rhythm.  Abdomen is soft.  Bowel sounds are active.  He has no fluid wave.  Extremity shows no clubbing, cyanosis or edema.  Neurological exam is nonfocal. ? ?Hopefully, [name] will go home today.  Let us see about getting back to the office in a week or so so we can continue him on treatment.  Looks like treatment is at least holding his disease stable. ? ?I know he has had incredible care from all the staff on [facility].  I do appreciate all of their compassion. ? ?[name], MD ? ?Hebrews 12:12 ? ?

## 2021-04-23 NOTE — TOC Transition Note (Signed)
Transition of Care (TOC) - CM/SW Discharge Note ? ? ?Patient Details  ?Name: John [name] ?MRN: [id number] ?Date of Birth: Jun 29, 1960 ? ?Transition of Care [facility]) CM/SW Contact:  ?Dessa Phi, RN ?Phone Number: ?04/23/2021, 9:05 AM ? ? ?Clinical Narrative: d/c home no CM needs.   ? ? ? ?Final next level of care: Home/Self Care ?Barriers to Discharge: No Barriers Identified ? ? ?Patient Goals and CMS Choice ?Patient states their goals for this hospitalization and ongoing recovery are:: home ?  ?Choice offered to / list presented to : Patient ? ?Discharge Placement ?  ?           ?  ?  ?  ?  ? ?Discharge Plan and Services ?  ?Discharge Planning Services: CM Consult ?           ?  ?  ?  ?  ?  ?  ?  ?  ?  ?  ? ?Social Determinants of Health (SDOH) Interventions ?  ? ? ?Readmission Risk Interventions ?No flowsheet data found. ? ? ? ? ?

## 2021-04-23 NOTE — Discharge Summary (Addendum)
Physician Discharge Summary   Patient: John [name] MRN: [id number] DOB: 1960-04-01  Admit date:     04/17/2021  Discharge date: 04/23/21  Discharge Physician: Charlynne Cousins   PCP: [name], MD   Recommendations at discharge:   Follow-up with pulmonary as an outpatient.  Discharge Diagnoses: Principal Problem:   Pneumothorax Active Problems:   Stage 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 (HCC)   Persistent atrial fibrillation (HCC)   Normocytic anemia  Resolved Problems:   * No resolved hospital problems. *  Hospital Course: 61 year old male PMH recurrent pneumothorax, sent from pulmonary office for admission for recurrent pneumothorax. Management has been conservative per pulmonology w/ NRB for nitrogen washout initially, now on [place]. Some improvement in PTX has been seen. --3/7 pneumothorax stable from yesterday. Plan is to continue current treatment, check CT and hopeful for discharge tomorrow  if PTX improves. Management per pulmonology.  Assessment and Plan: * Pneumothorax -- Recurrent complicated by underlying emphysema and history of cancer. Currently requiring 3 L of oxygen to keep saturations greater than 95%. Pulmonary has been consulted, CT of chest shows small pneumothorax Manage conservatively, will need to follow-up with pulmonary as an outpatient in 2 to 4 weeks.  Stage 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 (HCC) Follow-up with Dr. Earlie Server as an outpatient  Normocytic anemia of chronic disease Noted, stable.  Persistent atrial fibrillation (HCC) Continue Toprol, restarted Eliquis as an outpatient.   Anemia of chronic disease: Follow-up with hematology as an outpatient.      Consultants: Pulmonary Procedures performed: CT of the chest that showed a small pneumothorax Disposition: Home Diet recommendation:  Discharge Diet Orders (From admission, onward)     Start     Ordered   04/23/21 0000  Diet - low sodium heart healthy         04/23/21 0839           Regular diet DISCHARGE MEDICATION: Allergies as of 04/23/2021       Reactions   Albuterol Other (See Comments)   Patient has had episode of atrial fib following administration of albuterol (tolerates Xopenex well)   Atorvastatin Other (See Comments)   Cannot take at this time        Medication List     TAKE these medications    acetaminophen 500 MG tablet Commonly known as: TYLENOL Take 1,000 mg by mouth every 6 (six) hours as needed for mild pain.   apixaban 5 MG Tabs tablet Commonly known as: Eliquis Take 1 tablet (5 mg total) by mouth 2 (two) times daily.   levalbuterol 0.63 MG/3ML nebulizer solution Commonly known as: XOPENEX Take 3 mLs (0.63 mg total) by nebulization every 8 (eight) hours as needed for wheezing or shortness of breath.   LORazepam 0.5 MG tablet Commonly known as: Ativan Take 1 tablet (0.5 mg total) by mouth every 6 (six) hours as needed (Nausea or vomiting).   metoprolol succinate 25 MG 24 hr tablet Commonly known as: Toprol XL Take one tablet by mouth daily What changed:  how much to take when to take this additional instructions   ondansetron 8 MG tablet Commonly known as: Zofran Take 1 tablet (8 mg total) by mouth 2 (two) times daily as needed for refractory nausea / vomiting.   PARoxetine 10 MG tablet Commonly known as: PAXIL Take 10 mg by mouth at bedtime.   predniSONE 5 MG tablet Commonly known as: DELTASONE Take 1 tablet (5 mg total) by mouth daily.  PriLOSEC OTC 20 MG tablet Generic drug: omeprazole Take 20 mg by mouth at bedtime.   prochlorperazine 10 MG tablet Commonly known as: COMPAZINE Take 1 tablet (10 mg total) by mouth every 6 (six) hours as needed (Nausea or vomiting).   umeclidinium-vilanterol 62.5-25 MCG/ACT Aepb Commonly known as: ANORO ELLIPTA Inhale 1 puff into the lungs daily.               Durable Medical Equipment  (From admission, onward)           Start      Ordered   04/22/21 0814  For home use only DME oxygen  Once       Question Answer Comment  Length of Need Lifetime   Mode or (Route) Nasal cannula   Liters per Minute 3   Oxygen delivery system Gas      04/22/21 0814            Discharge Exam: Filed Weights   04/18/21 1319  Weight: 105.7 kg   General exam: In no acute distress. Respiratory system: Good air movement and clear to auscultation. Cardiovascular system: S1 & S2 heard, RRR. No JVD. Gastrointestinal system: Abdomen is nondistended, soft and nontender.  Extremities: No pedal edema. Skin: No rashes, lesions or ulcers Psychiatry: Judgement and insight appear normal. Mood & affect appropriate.  Condition at discharge: good  The results of significant diagnostics from this hospitalization (including imaging, microbiology, ancillary and laboratory) are listed below for reference.   Imaging Studies: DG Chest 1 View  Result Date: 04/01/2021 CLINICAL DATA:  Pneumothorax EXAM: CHEST  1 VIEW COMPARISON:  Radiograph 03/31/2021 FINDINGS: Unchanged cardiomediastinal silhouette. Unchanged right perihilar opacity with cystic lucencies in the right midlung. There are unchanged interstitial and lower lung airspace opacities in the right lung. There are unchanged peripheral predominant interstitial opacities in the left lung with some more centrally faint opacity. Unchanged position of right basilar chest tube. Unchanged small residual right lateral pneumothorax. Bones are unchanged. IMPRESSION: Unchanged small residual right lateral pneumothorax with basilar chest tube in place. Unchanged parenchymal opacities bilaterally. Electronically Signed   By: Maurine Simmering M.D.   On: 04/01/2021 08:06   DG Chest 1 View  Result Date: 03/31/2021 CLINICAL DATA:  Pneumothorax. EXAM: CHEST  1 VIEW COMPARISON:  March 30, 2021. FINDINGS: Stable cardiomediastinal silhouette. Stable right midlung and left basilar opacities are noted. Stable small  pneumothorax is noted laterally in the right hemithorax. Right-sided chest tube is unchanged. Bony thorax is unremarkable. IMPRESSION: Stable position of right-sided chest tube with stable small right lateral pneumothorax. Stable bilateral lung opacities are noted. Electronically Signed   By: Marijo Conception M.D.   On: 03/31/2021 08:15   DG Chest 1 View  Result Date: 03/30/2021 CLINICAL DATA:  Status post chest tube insertion. EXAM: CHEST  1 VIEW COMPARISON:  Chest x-ray 03/30/2021 5:35 p.m. FINDINGS: The heart and mediastinal contours are unchanged. Aortic calcification. Low lung volume. Bilateral mid to lower lung zone interstitial and airspace opacities. No pulmonary edema. Interval placement of a right chest tube with pigtail overlying the right costophrenic angle. Interval decrease in size of a now small right pneumothorax. No pneumothorax. No acute osseous abnormality. IMPRESSION: 1. Interval placement of a right chest tube with pigtail overlying the right costophrenic angle. Interval decrease in size of a now small right pneumothorax. 2. Bilateral mid to lower lung zone interstitial and airspace opacities. 3.  Aortic Atherosclerosis (ICD10-I70.0). Electronically Signed   By: Clelia Croft.D.  On: 03/30/2021 19:03   DG Chest 2 View  Result Date: 04/22/2021 CLINICAL DATA:  Partial lung collapse. History of squamous cell lung cancer EXAM: CHEST - 2 VIEW COMPARISON:  April 21, 2021 FINDINGS: Trace residual right hydropneumothorax unchanged. Emphysema with similar chronic fibrosis and post radiation change. The heart size and mediastinal contours are unchanged. No acute osseous abnormality. IMPRESSION: Trace residual right hydropneumothorax unchanged. Electronically Signed   By: Dahlia Bailiff M.D.   On: 04/22/2021 10:05   DG Chest 2 View  Result Date: 04/17/2021 CLINICAL DATA:  Secondary spontaneous pneumothorax EXAM: CHEST - 2 VIEW COMPARISON:  Portable exam 1152 hours compared to 04/15/2021  FINDINGS: Normal heart size, mediastinal contours, and pulmonary vascularity. Chest wall emphysema RIGHT chest again identified. Increase in RIGHT pneumothorax since previous exam. No mediastinal shift. BILATERAL pulmonary infiltrates increased since previous exam. No pleural effusion or acute osseous findings. IMPRESSION: Increased RIGHT pneumothorax without mediastinal shift, estimated 20%. Increased pulmonary infiltrates since previous study. These results will be called to the ordering clinician or representative by the Radiologist Assistant, and communication documented in the PACS or [employer]. Electronically Signed   By: Lavonia Dana M.D.   On: 04/17/2021 12:07   DG Chest 2 View  Result Date: 04/14/2021 CLINICAL DATA:  Chest tube removal, history of pneumothorax, lung cancer EXAM: CHEST - 2 VIEW COMPARISON:  04/14/2021 at 5:55 p.m. FINDINGS: Frontal and lateral views of the chest demonstrate interval removal of the right pigtail drainage catheter seen previously. Extensive subcutaneous gas is again seen throughout the right chest wall. There is evidence of a small recurrent right-sided pneumothorax, volume estimated approximately 10%. Slight increased consolidation in the right perihilar region consistent with atelectasis superimposed upon prior airspace disease. Stable patchy consolidation within the left lung base. No pleural effusion. No acute bony abnormalities. IMPRESSION: 1. Small recurrent right pneumothorax after chest tube removal, volume estimated 10%. 2. Extensive subcutaneous gas throughout the right chest wall, stable. 3. Bilateral lung consolidation, slightly increased in the right perihilar region. This likely reflects atelectasis superimposed upon chronic airspace disease. Critical Value/emergent results were called by telephone at the time of interpretation on 04/14/2021 at 7:01 pm to provider [facility] , who verbally acknowledged these results. Electronically Signed   By:  Randa Ngo M.D.   On: 04/14/2021 19:05   DG Chest 2 View  Result Date: 04/14/2021 CLINICAL DATA:  Right pneumothorax EXAM: CHEST - 2 VIEW COMPARISON:  Previous studies including the examination done earlier today FINDINGS: Right chest tube is noted with its tip in the lateral aspect of right upper lung fields. There is interval resolution of small right apical pneumothorax. Subcutaneous emphysema is seen in the right chest wall and right lower neck. There is possible bulla or cavitary pneumonia in right parahilar region which has not changed significantly. There are linear densities in the lower lung fields with interval improvement. There are no new focal infiltrates. There is blunting of both lateral CP angles. IMPRESSION: There is interval resolution of right pneumothorax. There is improvement in aeration of lower lung fields suggesting resolving atelectasis/pneumonia. Electronically Signed   By: Elmer Picker M.D.   On: 04/14/2021 18:08   DG Chest 2 View  Result Date: 04/14/2021 CLINICAL DATA:  Shortness of breath. EXAM: CHEST - 2 VIEW COMPARISON:  04/02/2021 FINDINGS: Two views of the chest demonstrated a right pneumothorax measuring at least 10-15% size. Difficult to evaluate the pneumothorax due to a large amount of subcutaneous gas in the right chest. Subcutaneous  gas is new. However, the pneumothorax is likely enlarged since 04/02/2021. There is a pigtail chest tube which appears to be in the anterior right chest based on the lateral view. It is possible that a side hole could be outside of the chest. Again noted are patchy densities in the right perihilar region. Coarse interstitial densities in the periphery of the left lung are again noted. Trachea is midline. Heart size is stable. IMPRESSION: 1. Enlargement of the right pneumothorax measuring at least 10-15% in size but limited evaluation due to a large amount of subcutaneous gas. The subcutaneous gas is new. 2. Right chest tube is  present and likely within the anterior right chest. Only a small portion of the tube appears to be within the chest and it is possible a side hole could be outside of the pleural space. 3. Persistent densities in the right perihilar region. 4. Chronic changes in the periphery of the left lung. Electronically Signed   By: Markus Daft M.D.   On: 04/14/2021 16:18   CT CHEST WO CONTRAST  Result Date: 04/21/2021 CLINICAL DATA:  Pneumothorax Evaluate residual pneumothorax, hx 2 chest tubes recently EXAM: CT CHEST WITHOUT CONTRAST TECHNIQUE: Multidetector CT imaging of the chest was performed following the standard protocol without IV contrast. RADIATION DOSE REDUCTION: This exam was performed according to the departmental dose-optimization program which includes automated exposure control, adjustment of the mA and/or kV according to patient size and/or use of iterative reconstruction technique. COMPARISON:  CT 03/30/2021 FINDINGS: Cardiovascular: Normal cardiac size. No pericardial disease. Coronary artery calcifications. Mild atherosclerosis of the thoracic aorta. Normal size main and branch pulmonary arteries. Mediastinum/Nodes: No mediastinal, hilar, or axillary lymphadenopathy. The thyroid is unremarkable. The esophagus is unremarkable. The trachea is unremarkable. Lungs/Pleura: Persistent areas of consolidation within the right lung with associated bronchiectasis and similar appearing cystic areas in the right upper lung. Centrilobular and paraseptal emphysema. Unchanged subpleural reticulation and areas of geographic ground-glass and interlobular septal thickening throughout the lungs. Trace right pleural effusion. There is a tiny residual anterior pneumothorax. Upper Abdomen: Cholelithiasis. 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 Musculoskeletal: No acute osseous abnormality. No suspicious lytic or blastic lesions. IMPRESSION: Trace residual right-sided  pneumothorax anteriorly. Trace right pleural effusion. Stable chronic pulmonary parenchymal changes of radiation treatment and fibrosis with cystic change in the right mid to upper lung. Aortic Atherosclerosis (ICD10-I70.0) and Emphysema (ICD10-J43.9). Electronically Signed   By: Maurine Simmering M.D.   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 Evaluate treatment response. Shortness of breath EXAM: CT CHEST, ABDOMEN, AND PELVIS WITH CONTRAST TECHNIQUE: Multidetector CT imaging of the chest, abdomen and pelvis was performed following the standard protocol during bolus administration of intravenous contrast. RADIATION DOSE REDUCTION: This exam was performed according to the departmental dose-optimization program which includes automated exposure control, adjustment of the mA and/or kV according to patient size and/or use of iterative reconstruction technique. CONTRAST:  169mL ISOVUE-300 IOPAMIDOL (ISOVUE-300) INJECTION 61% COMPARISON:  12/31/2020 chest CT.  Abdominopelvic CT 07/18/2020 FINDINGS: CT CHEST FINDINGS Cardiovascular: Aortic atherosclerosis. Normal heart size with lipomatous hypertrophy of the interatrial septum. Lad and left circumflex coronary artery calcification. Mediastinum/Nodes: No supraclavicular adenopathy. No mediastinal or hilar adenopathy. Lungs/Pleura: No pleural fluid. Development of a large right-sided pneumothorax. The previously described pneumomediastinum has resolved. Centrilobular emphysema. The right lung is poorly evaluated secondary to collapse. Suspect  underlying fibrosis, with architectural distortion and scattered ground-glass. Right upper lobe or superior segment lower lobe dominant bulla/blebs on 63/4. Underlying centrilobular emphysema. 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 Musculoskeletal: No  acute osseous abnormality. CT ABDOMEN PELVIS FINDINGS Hepatobiliary: Normal liver. 1.8 cm stone in the gallbladder neck. No acute cholecystitis or biliary duct dilatation. Pancreas: Pancreatic fatty replacement throughout the body and head. No duct dilatation or acute inflammation. Spleen: Minimal splenic hypoattenuation along the capsule on 62/2 likely represents sequelae of splenic infarct. Adrenals/Urinary Tract: Normal left adrenal gland. Bilobed right adrenal mass measures 4.9 x 3.1 cm on 58/2 versus 5.8 x 3.0 cm on the prior exam. Too small to characterize interpolar left renal lesion. Normal right kidney. No hydronephrosis. Normal urinary bladder. Stomach/Bowel: Normal stomach, without wall thickening. Normal colon, appendix, and terminal ileum. Normal small bowel. Vascular/Lymphatic: Aortic atherosclerosis. No abdominopelvic adenopathy. Reproductive: Normal prostate. Other: Small fat containing left inguinal hernia. No significant free fluid. No evidence of omental or peritoneal disease. Tiny fat containing ventral abdominal wall hernia. Musculoskeletal: L2 mild compression deformity or Schmorl's node, similar. IMPRESSION: CT CHEST IMPRESSION 1. Large right-sided pneumothorax. 2. Suboptimal evaluation of the right lung. Suspect treatment related fibrosis, as before. 3. No thoracic adenopathy. 4. Coronary artery atherosclerosis. Aortic Atherosclerosis (ICD10-I70.0). Emphysema (ICD10-J43.9). CT ABDOMEN AND PELVIS IMPRESSION 1. 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 2. No new or progressive abdominopelvic metastasis. 3. Cholelithiasis. 4. Sequelae of splenic infarct. A call to the emergency room provider at [facility] (where patient is currently) is pending as of 6:08 p.m. Electronically Signed   By: Abigail Miyamoto M.D.   On: 03/30/2021 18:08   DG Chest Port 1 View  Result Date: 04/21/2021 CLINICAL DATA:  Pneumothorax. EXAM: PORTABLE CHEST 1 VIEW COMPARISON:  Portable chest yesterday at  7:40 a.m. FINDINGS: 4:42 a.m., 04/21/2021. Small loculated lateral right mid chest pneumothorax versus hydropneumothorax is unchanged in appearance with no new area of pneumothorax. Emphysematous and chronic changes are again noted with asymmetric right perihilar cavitary or bullous disease and right hilar architectural distortion. Right chest wall soft tissue gas continues to improve. No active infiltrate is seen on the left. The cardiac size is stable, with stable mediastinum. IMPRESSION: Overall aeration is unchanged. Small loculated right pneumothorax or hydropneumothorax is unchanged as well as adjacent asymmetric right mid lung bullous or cavitary disease. Right chest wall emphysema continues to improve. Stable emphysematous and chronic changes. Electronically Signed   By: Telford Nab M.D.   On: 04/21/2021 05:56   DG CHEST PORT 1 VIEW  Result Date: 04/20/2021 CLINICAL DATA:  61 year old male with metastatic small cell lung cancer, chronic lung disease, right pneumothorax last month. EXAM: PORTABLE CHEST 1 VIEW COMPARISON:  Portable chest 04/19/2021 and earlier. FINDINGS: Portable AP semi upright view at 0740 hours. Bullous emphysema, architectural distortion about the right hilum and small loculated right pneumothorax remains stable since 04/18/2021. Right chest wall soft tissue gas appears mildly regressed since then. Stable lung volumes and mediastinal contours. Stable left lung. No new pulmonary opacity. Visualized tracheal air column is within normal limits. No acute osseous abnormality identified. IMPRESSION: 1. Stable small loculated right pneumothorax since 04/18/2021. Stable to mildly regressed right chest wall gas. 2. Chronic lung disease.  No new cardiopulmonary abnormality. Electronically Signed   By: Genevie Ann M.D.   On: 04/20/2021 07:57   DG CHEST PORT 1 VIEW  Result Date: 04/19/2021 CLINICAL DATA:  61 year old male with history of  right-sided pneumothorax. Follow-up study. EXAM: PORTABLE  CHEST 1 VIEW COMPARISON:  Chest x-ray 04/18/2021. FINDINGS: Lung volumes remain low with widespread areas of interstitial prominence scattered throughout the lungs bilaterally, reflective of chronic interstitial lung disease. Small residual right pneumothorax noted, decreasing in size compared to the prior study. Ill-defined opacities in the right mid lung again noted, potentially areas of advanced scarring and/or residual airspace consolidation. No pleural effusions. No evidence of pulmonary edema. Heart size is normal. Small amount of residual subcutaneous emphysema and gas in the pectoralis musculature in the right chest wall. IMPRESSION: 1. Resolving small right pneumothorax. 2. Otherwise, the radiographic appearance the chest is essentially unchanged, as above. Electronically Signed   By: Vinnie Langton M.D.   On: 04/19/2021 06:04   DG Chest Port 1 View  Result Date: 04/18/2021 CLINICAL DATA:  Follow-up right-sided pneumothorax EXAM: PORTABLE CHEST 1 VIEW COMPARISON:  04/18/2021, 5:05 a.m. FINDINGS: Mild cardiomegaly. Unchanged, small right pneumothorax predominantly about the lateral right lung and approximately 15% volume, adjacent to a multi cystic lesion of the right midlung. Diffuse bilateral interstitial opacity. No new or focal airspace opacity. IMPRESSION: Unchanged, small right pneumothorax predominantly about the lateral right lung and approximately 15% volume, adjacent to a multi cystic lesion of the right midlung. Electronically Signed   By: Delanna Ahmadi M.D.   On: 04/18/2021 17:11   DG Chest Port 1 View  Result Date: 04/18/2021 CLINICAL DATA:  61 year old male with metastatic small cell lung cancer, chronic lung disease, right pneumothorax last month. EXAM: PORTABLE CHEST 1 VIEW COMPARISON:  Portable chest 04/17/2021 and earlier. FINDINGS: Portable AP semi upright view at 0505 hours. Bullous emphysema in the right mid lung. Small to moderate right side pneumothorax, with pleural edge  visible laterally to the lateral 4th rib level is unchanged since yesterday, increased compared to 04/15/2021. Moderate volume right chest wall subcutaneous gas appears stable. Stable lung volumes and mediastinal contours. Visualized tracheal air column is within normal limits. Pronounced subpleural scarring in the left lung appears stable. No acute pulmonary opacity. No acute osseous abnormality identified. IMPRESSION: 1. Moderate right side pneumothorax superimposed on bullous emphysema is stable from yesterday, increased since 04/15/2021. 2. Moderate volume right chest wall subcutaneous gas. 3. No new cardiopulmonary abnormality. Electronically Signed   By: Genevie Ann M.D.   On: 04/18/2021 05:37   DG Chest Port 1 View  Result Date: 04/17/2021 CLINICAL DATA:  Pneumothorax follow-up. EXAM: PORTABLE CHEST 1 VIEW COMPARISON:  Chest x-ray from same day at 1152 hours. FINDINGS: Small right pneumothorax is unchanged. Unchanged volume loss and bullous changes in the right lung. Similar peripheral and basilar predominant interstitial thickening in both lungs. No large pleural effusion. The heart size and mediastinal contours are within normal limits. No acute osseous abnormality. Unchanged right chest wall subcutaneous emphysema. IMPRESSION: 1. Unchanged small right pneumothorax. 2. Unchanged chronic interstitial lung disease. Electronically Signed   By: Titus Dubin M.D.   On: 04/17/2021 18:11   DG Chest Port 1 View  Result Date: 04/15/2021 CLINICAL DATA:  Right pneumothorax EXAM: PORTABLE CHEST 1 VIEW COMPARISON:  Previous studies including the examination done earlier today FINDINGS: There is interval decrease in small pneumothorax in the lateral aspect of right mid lung fields. There is no demonstrable apical pneumothorax. There are linear densities in the right parahilar region along with emphysematous bullae which have not changed significantly. There is blunting of right lateral CP angle. There is  subcutaneous emphysema. IMPRESSION: There is interval decrease in loculated pneumothorax  in the lateral aspect of right mid lung fields. There is no demonstrable apical pneumothorax. Linear densities are seen in the right parahilar region suggesting scarring or subsegmental atelectasis. Bullae are noted in the right parahilar region. Electronically Signed   By: Elmer Picker M.D.   On: 04/15/2021 14:34   DG CHEST PORT 1 VIEW  Result Date: 04/15/2021 CLINICAL DATA:  pneumothorax EXAM: PORTABLE CHEST - 1 VIEW COMPARISON:  the previous day's study FINDINGS: Small residual lateral right pneumothorax, slightly less conspicuous than on prior study. Moderate right subcutaneous emphysema as before. Stable right perihilar consolidation and cystic changes in the right mid lung. Mild left infrahilar airspace opacities. Heart size and mediastinal contours are otherwisewithin normal limits. No  definite pleural effusion. Visualized bones unremarkable. IMPRESSION: Stable small right lateral pneumothorax, with extensive subcutaneous emphysema. Electronically Signed   By: Lucrezia Europe M.D.   On: 04/15/2021 08:11   DG CHEST PORT 1 VIEW  Result Date: 04/02/2021 CLINICAL DATA:  Status post chest tube placement. EXAM: PORTABLE CHEST 1 VIEW COMPARISON:  04/02/21 at 9:02 a.m. FINDINGS: There is been interval placement a right-sided chest tube with significant reduction in volume of right-sided pneumothorax. The right pneumothorax measures approximately 4 mm measured over the lateral right lung. This is compared with 3.9 cm earlier today. Similar appearance of the right mid lung cavitary mass. There is diffuse peripheral and basilar predominant reticular interstitial opacities within the left lung, unchanged. IMPRESSION: 1. Interval placement of right-sided chest tube with significant reduction in volume of right-sided pneumothorax. 2. No change in right mid lung cavitary mass. Electronically Signed   By: Kerby Moors M.D.    On: 04/02/2021 12:26   DG CHEST PORT 1 VIEW  Result Date: 04/02/2021 CLINICAL DATA:  Shortness of breath, pneumothorax EXAM: PORTABLE CHEST 1 VIEW COMPARISON:  04/01/2021 FINDINGS: Cardiomegaly. Interval removal of a previously seen right-sided pigtail chest tube. Large right pneumothorax, approximately 70% volume, increased compared to prior examination. Cavitary lesion of the right midlung again noted. Diffuse interstitial opacity of the left lung. The visualized skeletal structures are unremarkable. IMPRESSION: 1. Large right pneumothorax, approximately 70% volume, increased compared to prior examination following removal of chest tube. 2. Cavitary lesion of the right midlung again noted. 3. Diffuse interstitial opacity of the left lung, likely edema in the setting of cardiomegaly. 4. Cardiomegaly. These results will be called to the ordering clinician or representative by the Radiologist Assistant, and communication documented in the PACS or [employer]. Electronically Signed   By: Delanna Ahmadi M.D.   On: 04/02/2021 09:17   DG Chest Port 1 View  Result Date: 03/30/2021 CLINICAL DATA:  Pneumothorax EXAM: PORTABLE CHEST 1 VIEW COMPARISON:  01/12/2021, CT 12/31/2020, chest x-ray 05/12/2020, 10/10/2020 FINDINGS: Large cavitary lesion in the right mid lung. Underlying coarse interstitial opacity. Normal cardiac size with aortic atherosclerosis. Right-sided pneumothorax approaches 50%. Slight midline shift to the left. IMPRESSION: 1. Large right-sided pneumothorax approaches 50%. 2. There is underlying interstitial disease. Cavitary lesion in the right mid lung as noted on previous exam Critical Value/emergent results were called by telephone at the time of interpretation on 03/30/2021 at 5:56 pm to provider [facility] , who verbally acknowledged these results. Electronically Signed   By: Donavan Foil M.D.   On: 03/30/2021 17:56    Microbiology: Results for orders placed or performed during the  hospital encounter of 04/17/21  Resp Panel by RT-PCR (Flu A&B, Covid) Nasopharyngeal Swab     Status: None   Collection  Time: 04/17/21  1:10 PM   Specimen: Nasopharyngeal Swab; Nasopharyngeal(NP) swabs in vial transport medium  Result Value Ref Range Status   SARS Coronavirus 2 by RT PCR NEGATIVE NEGATIVE Final    Comment: (NOTE) SARS-CoV-2 target nucleic acids are NOT DETECTED.  The SARS-CoV-2 RNA is generally detectable in upper respiratory specimens during the acute phase of infection. The lowest concentration of SARS-CoV-2 viral copies this assay can detect is 138 copies/mL. A negative result does not preclude SARS-Cov-2 infection and should not be used as the sole basis for treatment or other patient management decisions. A negative result may occur with  improper specimen collection/handling, submission of specimen other than nasopharyngeal swab, presence of viral mutation(s) within the areas targeted by this assay, and inadequate number of viral copies(<138 copies/mL). A negative result must be combined with clinical observations, patient history, and epidemiological information. The expected result is Negative.  Fact Sheet for Patients:  EntrepreneurPulse.com.au  Fact Sheet for Healthcare Providers:  IncredibleEmployment.be  This test is no t yet approved or cleared by the [place] FDA and  has been authorized for detection and/or diagnosis of SARS-CoV-2 by FDA under an Emergency Use Authorization (EUA). This EUA will remain  in effect (meaning this test can be used) for the duration of the COVID-19 declaration under Section 564(b)(1) of the Act, 21 U.S.C.section 360bbb-3(b)(1), unless the authorization is terminated  or revoked sooner.       Influenza A by PCR NEGATIVE NEGATIVE Final   Influenza B by PCR NEGATIVE NEGATIVE Final    Comment: (NOTE) The Xpert Xpress SARS-CoV-2/FLU/RSV plus assay is intended as an aid in the  diagnosis of influenza from Nasopharyngeal swab specimens and should not be used as a sole basis for treatment. Nasal washings and aspirates are unacceptable for Xpert Xpress SARS-CoV-2/FLU/RSV testing.  Fact Sheet for Patients: EntrepreneurPulse.com.au  Fact Sheet for Healthcare Providers: IncredibleEmployment.be  This test is not yet approved or cleared by the [place] FDA and has been authorized for detection and/or diagnosis of SARS-CoV-2 by FDA under an Emergency Use Authorization (EUA). This EUA will remain in effect (meaning this test can be used) for the duration of the COVID-19 declaration under Section 564(b)(1) of the Act, 21 U.S.C. section 360bbb-3(b)(1), unless the authorization is terminated or revoked.  Performed at [facility], [place] [address], [place], [place] [address]     Labs: CBC: Recent Labs  Lab 04/17/21 1310 04/18/21 0549  WBC 6.3 6.1  NEUTROABS 4.0  --   HGB 10.9* 10.3*  HCT 33.8* 33.0*  MCV 89.7 89.4  PLT 321 637   Basic Metabolic Panel: Recent Labs  Lab 04/17/21 1310 04/18/21 0549  NA 135 134*  K 4.2 3.6  CL 101 99  CO2 27 27  GLUCOSE 107* 94  BUN 9 10  CREATININE 1.15 1.16  CALCIUM 8.2* 8.6*   Liver Function Tests: Recent Labs  Lab 04/18/21 0549  AST 11*  ALT 9  ALKPHOS 57  BILITOT 0.5  PROT 6.5  ALBUMIN 3.1*   CBG: No results for input(s): GLUCAP in the last 168 hours.  Discharge time spent: greater than 30 minutes.  Signed: Charlynne Cousins, MD [facility] 04/23/2021

## 2021-04-24 ENCOUNTER — Telehealth: Payer: Self-pay | Admitting: Hematology & Oncology

## 2021-04-24 NOTE — Telephone Encounter (Signed)
Called to reschedule appointments , left voicemail for patient to call us back  ?

## 2021-04-27 ENCOUNTER — Ambulatory Visit (HOSPITAL_COMMUNITY): Payer: 59 | Admitting: Physician Assistant

## 2021-05-05 ENCOUNTER — Other Ambulatory Visit: Payer: Self-pay

## 2021-05-05 ENCOUNTER — Ambulatory Visit (INDEPENDENT_AMBULATORY_CARE_PROVIDER_SITE_OTHER): Payer: 59

## 2021-05-05 ENCOUNTER — Ambulatory Visit (INDEPENDENT_AMBULATORY_CARE_PROVIDER_SITE_OTHER): Payer: 59 | Admitting: Adult Health

## 2021-05-05 ENCOUNTER — Encounter: Payer: Self-pay | Admitting: Adult Health

## 2021-05-05 ENCOUNTER — Ambulatory Visit: Payer: 59

## 2021-05-05 VITALS — BP 138/80 | HR 94 | Wt 238.0 lb

## 2021-05-05 DIAGNOSIS — J939 Pneumothorax, unspecified: Secondary | ICD-10-CM | POA: Diagnosis not present

## 2021-05-05 DIAGNOSIS — J9383 Other pneumothorax: Secondary | ICD-10-CM | POA: Diagnosis not present

## 2021-05-05 DIAGNOSIS — C3491 Malignant neoplasm of unspecified part of right bronchus or lung: Secondary | ICD-10-CM | POA: Diagnosis not present

## 2021-05-05 DIAGNOSIS — J9611 Chronic respiratory failure with hypoxia: Secondary | ICD-10-CM

## 2021-05-05 DIAGNOSIS — J449 Chronic obstructive pulmonary disease, unspecified: Secondary | ICD-10-CM

## 2021-05-05 NOTE — Progress Notes (Signed)
? ?@Patient  ID: John [name], male    DOB: May 10, 1960, 61 y.o.   MRN: [id number] ? ?Chief Complaint  ?Patient presents with  ? Follow-up  ? ? ?Referring provider: ?[name], MD ? 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  Patient has underlying mild COPD.  History of pneumonitis secondary to [facility] requiring prolonged steroid course.  Has chronic respiratory failure on oxygen with activity and At bedtime   ?Hx of  recurrent right-sided pneumothorax ?Medical history significant for atrial fibrillation on Eliquis and rheumatoid arthritis ? ?TEST/EVENTS :  ?CT chest April 21, 2021 shows trace residual right-sided pneumothorax anteriorly, trace right pleural effusion, stable chronic pulmonary parenchymal changes of radiation treatment and fibrosis with cystic change in the right mid to upper lung. ? ?05/05/2021 Follow up ; Pneumothorax , Post hospital  ?Patient returns for a post hospital follow-up.  Patient was readmitted earlier this month for recurrent right-sided pneumothorax.  Patient has a known history of emphysema and previous lung cancer with radiation therapy.  Previously treated with chest tube x2.  Last admission patient was treated with conservative therapy with oxygen.  Pneumothorax slowly improved.  Follow-up CT chest prior to discharge on March 7 showed trace residual right-sided pneumothorax anteriorly with a small trace right pleural effusion. ?Since discharge is feeling better. No increased dyspnea. No chest pain . O2 sats 96% on O2 at 2l/m. Chest xray today shows resolution of Pneumothorax.  ?Needs POC unable to lift heavy o2 tanks  ? ?As above has lung cancer followed by Oncology , starts back on Chemo later this week.  ? ?Last month patient developed a large right-sided pneumothorax he was admitted on March 30, 2021 and required a pigtail catheter placement.  Patient had a prolonged air leak.  And was discharged  with a pigtail catheter and express mini chamber. ?Patient returned back to the office earlier this week.  Complaining of significant right upper arm and shoulder swelling.  Patient with notable subcutaneous emphysema.  He was readmitted to the hospital and discharged on April 15, 2021.  Patient was found to have dislodged pigtail catheter and was removed.  Patient was continued on oxygen and follow-up chest x-ray showed significant improvement and near resolution of pneumothorax. ? ?Allergies  ?Allergen Reactions  ? Albuterol Other (See Comments)  ?  Patient has had episode of atrial fib following administration of albuterol (tolerates Xopenex well)  ? Atorvastatin Other (See Comments)  ?  Cannot take at this time  ? ? ?Immunization History  ?Administered Date(s) Administered  ? PFIZER Comirnaty(Gray Top)Covid-19 Tri-Sucrose Vaccine 05/10/2019, 06/04/2019, 12/31/2019  ? ? ?Past Medical History:  ?Diagnosis Date  ? Atrial fibrillation with RVR ([place]) 05/12/2020  ? Dyspnea   ? Family history of adverse reaction to anesthesia   ? brother with seizures had episode under anesthesia.  Patient has seizures as well.  ? GERD (gastroesophageal reflux disease)   ? History of radiation therapy 04/24/2020-05/16/2020  ? IMRT to right lung     Dr [name]  ? PAF (paroxysmal atrial fibrillation) ([place])   ? CHADS2VSAC score 0  ? Rheumatoid aortitis   ? Sepsis ([place]) 10/06/2020  ? Squamous cell lung cancer ([place])   ? ? ?Tobacco History: ?Social History  ? ?Tobacco Use  ?Smoking Status Former  ? Packs/day: 1.00  ? Years: 30.00  ? Pack years: 30.00  ? Types: Cigarettes  ? Quit date: 2018  ? Years since quitting:  5.2  ?Smokeless Tobacco Never  ?Tobacco Comments  ? Former smoker 10/28/2020  ? ?Counseling given: Not Answered ?Tobacco comments: Former smoker 10/28/2020 ? ? ?Outpatient Medications Prior to Visit  ?Medication Sig Dispense Refill  ? acetaminophen (TYLENOL) 500 MG tablet Take 1,000 mg by mouth every 6 (six) hours as needed for mild  pain.    ? apixaban (ELIQUIS) 5 MG TABS tablet Take 1 tablet (5 mg total) by mouth 2 (two) times daily. 60 tablet 6  ? levalbuterol (XOPENEX) 0.63 MG/3ML nebulizer solution Take 3 mLs (0.63 mg total) by nebulization every 8 (eight) hours as needed for wheezing or shortness of breath. 240 mL 11  ? LORazepam (ATIVAN) 0.5 MG tablet Take 1 tablet (0.5 mg total) by mouth every 6 (six) hours as needed (Nausea or vomiting). 30 tablet 0  ? metoprolol succinate (TOPROL XL) 25 MG 24 hr tablet Take one tablet by mouth daily (Patient taking differently: 25 mg at bedtime.)    ? ondansetron (ZOFRAN) 8 MG tablet Take 1 tablet (8 mg total) by mouth 2 (two) times daily as needed for refractory nausea / vomiting. 30 tablet 1  ? PARoxetine (PAXIL) 10 MG tablet Take 10 mg by mouth at bedtime.    ? predniSONE (DELTASONE) 5 MG tablet Take 1 tablet (5 mg total) by mouth daily. 90 tablet 6  ? PRILOSEC OTC 20 MG tablet Take 20 mg by mouth at bedtime.    ? prochlorperazine (COMPAZINE) 10 MG tablet Take 1 tablet (10 mg total) by mouth every 6 (six) hours as needed (Nausea or vomiting). 30 tablet 1  ? ?No facility-administered medications prior to visit.  ? ? ? ?Review of Systems:  ? ?Constitutional:   No  weight loss, night sweats,  Fevers, chills, fatigue, or  lassitude. ? ?HEENT:   No headaches,  Difficulty swallowing,  Tooth/dental problems, or  Sore throat,  ?              No sneezing, itching, ear ache, nasal congestion, post nasal drip,  ? ?CV:  No chest pain,  Orthopnea, PND, swelling in lower extremities, anasarca, dizziness, palpitations, syncope.  ? ?GI  No heartburn, indigestion, abdominal pain, nausea, vomiting, diarrhea, change in bowel habits, loss of appetite, bloody stools.  ? ?Resp:   No excess mucus, no productive cough,  No non-productive cough,  No coughing up of blood.  No change in color of mucus.  No wheezing.  No chest wall deformity ? ?Skin: no rash or lesions. ? ?GU: no dysuria, change in color of urine, no urgency  or frequency.  No flank pain, no hematuria  ? ?MS:  No joint pain or swelling.  No decreased range of motion.  No back pain. ? ? ? ?Physical Exam ? ?BP 138/80   Pulse 94   Wt 238 lb (108 kg)   SpO2 96% Comment: 3lpm  BMI 33.19 kg/m?  ? ?GEN: A/Ox3; pleasant , NAD, on O2  ?  ?HEENT:  /AT,   NOSE-clear, THROAT-clear, no lesions, no postnasal drip or exudate noted.  ? ?NECK:  Supple w/ fair ROM; no JVD; normal carotid impulses w/o bruits; no thyromegaly or nodules palpated; no lymphadenopathy.   ? ?RESP  few trace BB crackles . no accessory muscle use, no dullness to percussion ? ?CARD:  RRR, no m/r/g, no peripheral edema, pulses intact, no cyanosis or clubbing. ? ?GI:   Soft & nt; nml bowel sounds; no organomegaly or masses detected.  ? ?Musco: Warm bil, no  deformities or joint swelling noted.  ? ?Neuro: alert, no focal deficits noted.   ? ?Skin: Warm, no lesions or rashes ? ? ? ?Lab Results: ? ?CBC ? ? ?BMET ? ? ?BNP ? ? ?ProBNP ?No results found for: PROBNP ? ?Imaging: ?DG Chest 2 View ? ?Result Date: 05/05/2021 ?CLINICAL DATA:  Pneumothorax EXAM: CHEST - 2 VIEW COMPARISON:  04/22/2021 FINDINGS: Heart size is normal. Widespread chronic pulmonary scarring, right more than left, as seen previously. Chronic pulmonary cystic changes in the right posterior mid lung. No pleural air is identified. No worsening or new finding. IMPRESSION: No evidence of residual pneumothorax. Widespread pulmonary scarring, right more than left, with pulmonary cystic changes on the right. Electronically Signed   By: Nelson Chimes M.D.   On: 05/05/2021 16:00  ? ?DG Chest 2 View ? ?Result Date: 04/22/2021 ?CLINICAL DATA:  Partial lung collapse. History of squamous cell lung cancer EXAM: CHEST - 2 VIEW COMPARISON:  April 21, 2021 FINDINGS: Trace residual right hydropneumothorax unchanged. Emphysema with similar chronic fibrosis and post radiation change. The heart size and mediastinal contours are unchanged. No acute osseous abnormality.  IMPRESSION: Trace residual right hydropneumothorax unchanged. Electronically Signed   By: Dahlia Bailiff M.D.   On: 04/22/2021 10:05  ? ?DG Chest 2 View ? ?Result Date: 04/17/2021 ?CLINICAL DATA:  Secondary spo

## 2021-05-05 NOTE — Assessment & Plan Note (Addendum)
Continue on oxygen 2 L to maintain O2 saturations greater than 88 to 90%. ?Continue with POC. ?

## 2021-05-05 NOTE — Assessment & Plan Note (Signed)
Continue follow-up with oncology and treatment plan. ?

## 2021-05-05 NOTE — Assessment & Plan Note (Signed)
Appears to be compensated.  Albuterol as needed. ?

## 2021-05-05 NOTE — Assessment & Plan Note (Signed)
Right-sided pneumothorax with underlying emphysema and previous radiation pneumonitis with residual scarring and lung cancer. ?Patient has been hospitalized several times.  Initially required chest tube.  Most recent admission was treated conservatively with oxygen.  Since discharge patient has been doing well.  Chest x-ray today shows total resolution of pneumothorax.  Patient is to continue activity as tolerated.  Avoid heavy lifting and straining.  May advance activity slowly as tolerated.[name] ?Continue on oxygen to maintain O2 saturations greater than 88 to 90%. ? ?Plan  ?Patient Instructions  ?Continue on current regimen .  ?Continue on Oxygen 2l/m with activity and At bedtime   , goal is keep O2 sats >88-90%.  ?Order for POC .  ?Follow up with Oncology later this week as planned.  ?Follow up with Dr. [name]  in 3-4 weeks and As needed   ?Please contact office for sooner follow up if symptoms do not improve or worsen or seek emergency care  ? ?  ? ?

## 2021-05-05 NOTE — Patient Instructions (Addendum)
Continue on current regimen .  ?Continue on Oxygen 2l/m with activity and At bedtime   , goal is keep O2 sats >88-90%.  ?Order for POC .  ?Follow up with Oncology later this week as planned.  ?Follow up with Dr. [name]  in 3-4 weeks and As needed   ?Please contact office for sooner follow up if symptoms do not improve or worsen or seek emergency care  ? ?

## 2021-05-08 ENCOUNTER — Inpatient Hospital Stay: Payer: 59 | Attending: Internal Medicine

## 2021-05-08 ENCOUNTER — Inpatient Hospital Stay: Payer: 59 | Admitting: Hematology & Oncology

## 2021-05-08 ENCOUNTER — Other Ambulatory Visit: Payer: Self-pay

## 2021-05-08 ENCOUNTER — Inpatient Hospital Stay: Payer: 59

## 2021-05-08 ENCOUNTER — Encounter: Payer: Self-pay | Admitting: Hematology & Oncology

## 2021-05-08 VITALS — BP 116/75 | HR 94 | Temp 98.4°F | Resp 17 | Wt 233.0 lb

## 2021-05-08 DIAGNOSIS — Z5111 Encounter for antineoplastic chemotherapy: Secondary | ICD-10-CM | POA: Insufficient documentation

## 2021-05-08 DIAGNOSIS — C3411 Malignant neoplasm of upper lobe, right bronchus or lung: Secondary | ICD-10-CM | POA: Insufficient documentation

## 2021-05-08 DIAGNOSIS — C3400 Malignant neoplasm of unspecified main bronchus: Secondary | ICD-10-CM

## 2021-05-08 DIAGNOSIS — C3401 Malignant neoplasm of right main bronchus: Secondary | ICD-10-CM | POA: Diagnosis not present

## 2021-05-08 DIAGNOSIS — C3491 Malignant neoplasm of unspecified part of right bronchus or lung: Secondary | ICD-10-CM

## 2021-05-08 LAB — CMP (CANCER CENTER ONLY)
ALT: 11 U/L (ref 0–44)
AST: 16 U/L (ref 15–41)
Albumin: 3.7 g/dL (ref 3.5–5.0)
Alkaline Phosphatase: 87 U/L (ref 38–126)
Anion gap: 9 (ref 5–15)
BUN: 10 mg/dL (ref 6–20)
CO2: 28 mmol/L (ref 22–32)
Calcium: 9.3 mg/dL (ref 8.9–10.3)
Chloride: 102 mmol/L (ref 98–111)
Creatinine: 1.1 mg/dL (ref 0.61–1.24)
GFR, Estimated: >60 mL/min (ref >60)
Glucose, Bld: 89 mg/dL (ref 70–99)
Potassium: 3.8 mmol/L (ref 3.5–5.1)
Sodium: 139 mmol/L (ref 135–145)
Total Bilirubin: 0.7 mg/dL (ref 0.3–1.2)
Total Protein: 7 g/dL (ref 6.5–8.1)

## 2021-05-08 LAB — CBC WITH DIFFERENTIAL (CANCER CENTER ONLY)
Abs Immature Granulocytes: 0.01 10*3/uL (ref 0.00–0.07)
Basophils Absolute: 0 10*3/uL (ref 0.0–0.1)
Basophils Relative: 1 %
Eosinophils Absolute: 0.4 10*3/uL (ref 0.0–0.5)
Eosinophils Relative: 10 %
HCT: 33 % — ABNORMAL LOW (ref 39.0–52.0)
Hemoglobin: 10.6 g/dL — ABNORMAL LOW (ref 13.0–17.0)
Immature Granulocytes: 0 %
Lymphocytes Relative: 27 %
Lymphs Abs: 1.1 10*3/uL (ref 0.7–4.0)
MCH: 27.8 pg (ref 26.0–34.0)
MCHC: 32.1 g/dL (ref 30.0–36.0)
MCV: 86.6 fL (ref 80.0–100.0)
Monocytes Absolute: 0.6 10*3/uL (ref 0.1–1.0)
Monocytes Relative: 16 %
Neutro Abs: 1.9 10*3/uL (ref 1.7–7.7)
Neutrophils Relative %: 46 %
Platelet Count: 336 10*3/uL (ref 150–400)
RBC: 3.81 MIL/uL — ABNORMAL LOW (ref 4.22–5.81)
RDW: 15.2 % (ref 11.5–15.5)
WBC Count: 4 10*3/uL (ref 4.0–10.5)
nRBC: 0 % (ref 0.0–0.2)

## 2021-05-08 LAB — LACTATE DEHYDROGENASE: LDH: 188 U/L (ref 98–192)

## 2021-05-08 MED ORDER — SODIUM CHLORIDE 0.9 % IV SOLN
20.0000 mg | Freq: Once | INTRAVENOUS | Status: AC
  Administered 2021-05-08: 20 mg via INTRAVENOUS
  Filled 2021-05-08: qty 20

## 2021-05-08 MED ORDER — SODIUM CHLORIDE 0.9 % IV SOLN: Freq: Once | INTRAVENOUS | Status: AC

## 2021-05-08 MED ORDER — SODIUM CHLORIDE 0.9 % IV SOLN
10.0000 mg | Freq: Once | INTRAVENOUS | Status: DC
  Filled 2021-05-08: qty 1

## 2021-05-08 MED ORDER — SODIUM CHLORIDE 0.9 % IV SOLN
60.0000 mg/m2 | Freq: Once | INTRAVENOUS | Status: AC
  Administered 2021-05-08: 130 mg via INTRAVENOUS
  Filled 2021-05-08: qty 13

## 2021-05-08 NOTE — Progress Notes (Signed)
Reducing docetaxel to 60 mg/m2 today per Dr. Antonieta Pert instructions. ?

## 2021-05-08 NOTE — Progress Notes (Signed)
+ ?Hematology and Oncology Follow Up Visit ? ?John [name] ?[id number] ?March 02, 1960 61 y.o. ?05/08/2021 ? ? 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  ?Current Therapy:        ?Pembrolizumab 200 mg IV every 3 weeks -- s/p cycle 10 -- d/c due to pulmonary toxicity ?Taxotere 65m/m2 IV q 3 weeks -- s/p cycle #3 -- start on 01/29/2021 ? ?  ?Interim History:  John [name] here today for follow-up.  It has been 2 months since he has been here.  Unfortunate, he has had quite a few issues since we last saw him.  He has been hospitalized several times because of a right pneumothorax.  I am unsure how he got the pneumothorax.  He required a chest tube.  The chest tube would be taken out.  And then there were pneumothorax would come back. ? ?He was just in the hospital recently.  Thankfully, the pneumothorax seems to be resolved. ? ?While he was in the hospital, he had scans.  The scans really did not show anything with respect to malignancy in the lungs. ? ?The big news is that [facility] is going to the [employer]  [place]  [place] happy [place] him. ? ?His appetite has been okay.  He has had no nausea or vomiting.  He has had no cough or increased shortness of breath.  He is on chronic oxygen therapy.  He does have the chronic atrial fibrillation.  He is on amiodarone for this. ? ?Overall, I would say his performance status is probably ECOG 1.   ? ?Medications:  ?Allergies as of 05/08/2021   ? ?   Reactions  ? Albuterol Other (See Comments)  ? Patient has had episode of atrial fib following administration of albuterol (tolerates Xopenex well)  ? Atorvastatin Other (See Comments)  ? Cannot take at this time  ? ?  ? ?  ?Medication List  ?  ? ?  ? Accurate as of May 08, 2021  8:38 AM. If you have any questions, ask your nurse or doctor.  ?  ?  ? ?  ? ?acetaminophen 500 MG tablet ?Commonly known as: TYLENOL ?Take 1,000 mg by mouth every  6 (six) hours as needed for mild pain. ?  ?apixaban 5 MG Tabs tablet ?Commonly known as: Eliquis ?Take 1 tablet (5 mg total) by mouth 2 (two) times daily. ?  ?levalbuterol 0.63 MG/3ML nebulizer solution ?Commonly known as: XOPENEX ?Take 3 mLs (0.63 mg total) by nebulization every 8 (eight) hours as needed for wheezing or shortness of breath. ?  ?LORazepam 0.5 MG tablet ?Commonly known as: Ativan ?Take 1 tablet (0.5 mg total) by mouth every 6 (six) hours as needed (Nausea or vomiting). ?  ?metoprolol succinate 25 MG 24 hr tablet ?Commonly known as: Toprol XL ?Take one tablet by mouth daily ?What changed:  ?how much to take ?when to take this ?additional instructions ?  ?ondansetron 8 MG tablet ?Commonly known as: Zofran ?Take 1 tablet (8 mg total) by mouth 2 (two) times daily as needed for refractory nausea / vomiting. ?  ?PARoxetine 10 MG tablet ?Commonly known as: PAXIL ?Take 10 mg by mouth at bedtime. ?  ?predniSONE 5 MG tablet ?Commonly known as: DELTASONE ?Take 1 tablet (5 mg total) by mouth daily. ?  ?PriLOSEC OTC 20 MG tablet ?Generic drug: omeprazole ?Take 20 mg by mouth at bedtime. ?  ?  prochlorperazine 10 MG tablet ?Commonly known as: COMPAZINE ?Take 1 tablet (10 mg total) by mouth every 6 (six) hours as needed (Nausea or vomiting). ?  ? ?  ? ? ?Allergies:  ?Allergies  ?Allergen Reactions  ? Albuterol Other (See Comments)  ?  Patient has had episode of atrial fib following administration of albuterol (tolerates Xopenex well)  ? Atorvastatin Other (See Comments)  ?  Cannot take at this time  ? ? ?Past Medical History, Surgical history, Social history, and Family History were reviewed and updated. ? ?Review of Systems: ?Review of Systems  ?Constitutional:  Positive for malaise/fatigue.  ?HENT: Negative.    ?Eyes: Negative.   ?Respiratory:  Positive for cough and shortness of breath.   ?Cardiovascular:  Positive for chest pain.  ?Gastrointestinal: Negative.   ?Genitourinary: Negative.   ?Musculoskeletal:   Positive for joint pain.  ?Skin: Negative.   ?Neurological:  Positive for focal weakness.  ?Endo/Heme/Allergies: Negative.   ?Psychiatric/Behavioral: Negative.    ? ? ?Physical Exam: ? vitals were not taken for this visit.  ? ?Wt Readings from Last 3 Encounters:  ?05/05/21 238 lb (108 kg)  ?04/18/21 233 lb (105.7 kg)  ?04/17/21 233 lb (105.7 kg)  ?Vital signs are temperature of 98.4.  Pulse 94.  Blood pressure 116/75.  Weight is 233 pounds. ? ?Physical Exam ?Vitals reviewed.  ?HENT:  ?   Head: Normocephalic and atraumatic.  ?Eyes:  ?   Pupils: Pupils are equal, round, and reactive to light.  ?Cardiovascular:  ?   Rate and Rhythm: Normal rate and regular rhythm.  ?   Heart sounds: Normal heart sounds.  ?Pulmonary:  ?   Effort: Pulmonary effort is normal.  ?   Breath sounds: Normal breath sounds.  ?Abdominal:  ?   General: Bowel sounds are normal.  ?   Palpations: Abdomen is soft.  ?Musculoskeletal:     ?   General: No tenderness or deformity. Normal range of motion.  ?   Cervical back: Normal range of motion.  ?Lymphadenopathy:  ?   Cervical: No cervical adenopathy.  ?Skin: ?   General: Skin is warm and dry.  ?   Findings: No erythema or rash.  ?Neurological:  ?   Mental Status: He is alert and oriented to person, place, and time.  ?Psychiatric:     ?   Behavior: Behavior normal.     ?   Thought Content: Thought content normal.     ?   Judgment: Judgment normal.  ? ? ?Lab Results  ?Component Value Date  ? WBC 4.0 05/08/2021  ? HGB 10.6 (L) 05/08/2021  ? HCT 33.0 (L) 05/08/2021  ? MCV 86.6 05/08/2021  ? PLT 336 05/08/2021  ? ?No results found for: FERRITIN, IRON, TIBC, UIBC, IRONPCTSAT ?Lab Results  ?Component Value Date  ? RBC 3.81 (L) 05/08/2021  ? ?No results found for: KPAFRELGTCHN, LAMBDASER, KAPLAMBRATIO ?No results found for: IGGSERUM, IGA, IGMSERUM ?No results found for: TOTALPROTELP, ALBUMINELP, A1GS, A2GS, BETS, BETA2SER, GAMS, MSPIKE, SPEI ?  Chemistry   ?   ?Component Value Date/Time  ? NA 134 (L)  04/18/2021 0549  ? K 3.6 04/18/2021 0549  ? CL 99 04/18/2021 0549  ? CO2 27 04/18/2021 0549  ? BUN 10 04/18/2021 0549  ? CREATININE 1.16 04/18/2021 0549  ? CREATININE 1.13 03/13/2021 0909  ?    ?Component Value Date/Time  ? CALCIUM 8.6 (L) 04/18/2021 0549  ? ALKPHOS 57 04/18/2021 0549  ? AST 11 (L) 04/18/2021 0549  ?  AST 11 (L) 03/13/2021 0909  ? ALT 9 04/18/2021 0549  ? ALT 9 03/13/2021 0909  ? BILITOT 0.5 04/18/2021 0549  ? BILITOT 0.7 03/13/2021 0909  ?  ? ? ? ?Impression and Plan: John [name] is a very pleasant 61 yo gentleman with metastatic squamous cell carcinoma of the right lung. Tumor had high PD-L1 score.  ? ?We will go ahead and proceed with his fourth cycle of Taxotere.  Again just had scans done.  I think we can try to hold off on doing further scans probably till after his sixth cycle.  Hopefully, we can keep him on schedule.  I hate the fact that he had this right pneumothorax. ? 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  He has the atrial fibrillation.  He does have a lot of pulmonary issues that will require him to be on oxygen. ? ?We will plan to get him back in 3 more weeks.  Hopefully, he will not be hospitalized with any other issues. ? ? ?Volanda Napoleon, MD ?3/24/20238:38 AM ? ?

## 2021-05-08 NOTE — Patient Instructions (Signed)
[facility] [place]  Discharge Instructions: ?Thank you for choosing [facility] to provide your oncology and hematology care.  ? ?If you have a lab appointment with the [facility], please go directly to the [facility] and check in at the registration area. ? ?Wear comfortable clothing and clothing appropriate for easy access to any Portacath or PICC line.  ? ?We strive to give you quality time with your provider. You may need to reschedule your appointment if you arrive late (15 or more minutes).  Arriving late affects you and other patients whose appointments are after yours.  Also, if you miss three or more appointments without notifying the office, you may be dismissed from the clinic at the provider?s discretion.    ?  ?For prescription refill requests, have your pharmacy contact our office and allow 72 hours for refills to be completed.   ? ?Today you received the following chemotherapy and/or immunotherapy agents decadron, taxotere   ?  ?To help prevent nausea and vomiting after your treatment, we encourage you to take your nausea medication as directed. ? ?BELOW ARE SYMPTOMS THAT SHOULD BE REPORTED IMMEDIATELY: ?*FEVER GREATER THAN 100.4 F (38 ?C) OR HIGHER ?*CHILLS OR SWEATING ?*NAUSEA AND VOMITING THAT IS NOT CONTROLLED WITH YOUR NAUSEA MEDICATION ?*UNUSUAL SHORTNESS OF BREATH ?*UNUSUAL BRUISING OR BLEEDING ?*URINARY PROBLEMS (pain or burning when urinating, or frequent urination) ?*BOWEL PROBLEMS (unusual diarrhea, constipation, pain near the anus) ?TENDERNESS IN MOUTH AND THROAT WITH OR WITHOUT PRESENCE OF ULCERS (sore throat, sores in mouth, or a toothache) ?UNUSUAL RASH, SWELLING OR PAIN  ?UNUSUAL VAGINAL DISCHARGE OR ITCHING  ? ?Items with * indicate a potential emergency and should be followed up as soon as possible or go to the Emergency Department if any problems should occur. ? ?Please show the CHEMOTHERAPY ALERT CARD or IMMUNOTHERAPY ALERT CARD at check-in  to the Emergency Department and triage nurse. ?Should you have questions after your visit or need to cancel or reschedule your appointment, please contact [facility]  [phone] and follow the prompts.  Office hours are 8:00 a.m. to 4:30 p.m. Monday - Friday. Please note that voicemails left after 4:00 p.m. may not be returned until the following business day.  We are closed weekends and major holidays. You have access to a nurse at all times for urgent questions. Please call the main number to the clinic [phone] and follow the prompts. ? ?For any non-urgent questions, you may also contact your provider using MyChart. We now offer e-Visits for anyone 29 and older to request care online for non-urgent symptoms. For details visit [email or web address]. ?  ?Also download the MyChart app! Go to the app store, search "MyChart", open the app, select [facility], and log in with your MyChart username and password. ? ?Due to Covid, a mask is required upon entering the hospital/clinic. If you do not have a mask, one will be given to you upon arrival. For doctor visits, patients may have 1 support person aged 59 or older with them. For treatment visits, patients cannot have anyone with them due to current Covid guidelines and our immunocompromised population.  ?

## 2021-05-11 ENCOUNTER — Ambulatory Visit: Payer: 59

## 2021-05-11 ENCOUNTER — Encounter: Payer: Self-pay | Admitting: *Deleted

## 2021-05-11 NOTE — Progress Notes (Signed)
Patient called scheduling this AM stating that he is not coming in for the Ziextenzo injection because he took it once and had to be admitted to the hospital.  [name] notified. No new orders received at this time.  ?

## 2021-05-15 ENCOUNTER — Ambulatory Visit: Payer: 59 | Admitting: Cardiology

## 2021-06-01 ENCOUNTER — Other Ambulatory Visit: Payer: Self-pay

## 2021-06-01 ENCOUNTER — Encounter: Payer: Self-pay | Admitting: Hematology & Oncology

## 2021-06-01 ENCOUNTER — Inpatient Hospital Stay: Payer: 59 | Attending: Internal Medicine

## 2021-06-01 ENCOUNTER — Inpatient Hospital Stay: Payer: 59

## 2021-06-01 ENCOUNTER — Inpatient Hospital Stay (HOSPITAL_BASED_OUTPATIENT_CLINIC_OR_DEPARTMENT_OTHER): Payer: 59 | Admitting: Hematology & Oncology

## 2021-06-01 VITALS — BP 118/64 | HR 72 | Temp 98.2°F | Resp 18 | Wt 233.0 lb

## 2021-06-01 DIAGNOSIS — Z5111 Encounter for antineoplastic chemotherapy: Secondary | ICD-10-CM | POA: Diagnosis present

## 2021-06-01 DIAGNOSIS — C779 Secondary and unspecified malignant neoplasm of lymph node, unspecified: Secondary | ICD-10-CM | POA: Insufficient documentation

## 2021-06-01 DIAGNOSIS — C7970 Secondary malignant neoplasm of unspecified adrenal gland: Secondary | ICD-10-CM | POA: Diagnosis not present

## 2021-06-01 DIAGNOSIS — C3411 Malignant neoplasm of upper lobe, right bronchus or lung: Secondary | ICD-10-CM | POA: Insufficient documentation

## 2021-06-01 DIAGNOSIS — C3491 Malignant neoplasm of unspecified part of right bronchus or lung: Secondary | ICD-10-CM

## 2021-06-01 DIAGNOSIS — C3401 Malignant neoplasm of right main bronchus: Secondary | ICD-10-CM

## 2021-06-01 DIAGNOSIS — C3400 Malignant neoplasm of unspecified main bronchus: Secondary | ICD-10-CM

## 2021-06-01 LAB — LACTATE DEHYDROGENASE: LDH: 199 U/L — ABNORMAL HIGH (ref 98–192)

## 2021-06-01 LAB — CMP (CANCER CENTER ONLY)
ALT: 10 U/L (ref 0–44)
AST: 10 U/L — ABNORMAL LOW (ref 15–41)
Albumin: 3.8 g/dL (ref 3.5–5.0)
Alkaline Phosphatase: 93 U/L (ref 38–126)
Anion gap: 12 (ref 5–15)
BUN: 14 mg/dL (ref 6–20)
CO2: 23 mmol/L (ref 22–32)
Calcium: 9.4 mg/dL (ref 8.9–10.3)
Chloride: 101 mmol/L (ref 98–111)
Creatinine: 1.09 mg/dL (ref 0.61–1.24)
GFR, Estimated: >60 mL/min (ref >60)
Glucose, Bld: 197 mg/dL — ABNORMAL HIGH (ref 70–99)
Potassium: 4.3 mmol/L (ref 3.5–5.1)
Sodium: 136 mmol/L (ref 135–145)
Total Bilirubin: 0.5 mg/dL (ref 0.3–1.2)
Total Protein: 6.9 g/dL (ref 6.5–8.1)

## 2021-06-01 LAB — CBC WITH DIFFERENTIAL (CANCER CENTER ONLY)
Abs Immature Granulocytes: 0.05 10*3/uL (ref 0.00–0.07)
Basophils Absolute: 0 10*3/uL (ref 0.0–0.1)
Basophils Relative: 0 %
Eosinophils Absolute: 0 10*3/uL (ref 0.0–0.5)
Eosinophils Relative: 0 %
HCT: 35.4 % — ABNORMAL LOW (ref 39.0–52.0)
Hemoglobin: 11.3 g/dL — ABNORMAL LOW (ref 13.0–17.0)
Immature Granulocytes: 1 %
Lymphocytes Relative: 9 %
Lymphs Abs: 0.7 10*3/uL (ref 0.7–4.0)
MCH: 27 pg (ref 26.0–34.0)
MCHC: 31.9 g/dL (ref 30.0–36.0)
MCV: 84.7 fL (ref 80.0–100.0)
Monocytes Absolute: 0.1 10*3/uL (ref 0.1–1.0)
Monocytes Relative: 2 %
Neutro Abs: 6.5 10*3/uL (ref 1.7–7.7)
Neutrophils Relative %: 88 %
Platelet Count: 454 10*3/uL — ABNORMAL HIGH (ref 150–400)
RBC: 4.18 MIL/uL — ABNORMAL LOW (ref 4.22–5.81)
RDW: 15.6 % — ABNORMAL HIGH (ref 11.5–15.5)
WBC Count: 7.4 10*3/uL (ref 4.0–10.5)
nRBC: 0 % (ref 0.0–0.2)

## 2021-06-01 MED ORDER — SODIUM CHLORIDE 0.9 % IV SOLN
10.0000 mg | Freq: Once | INTRAVENOUS | Status: AC
  Administered 2021-06-01: 10 mg via INTRAVENOUS
  Filled 2021-06-01: qty 10

## 2021-06-01 MED ORDER — SODIUM CHLORIDE 0.9 % IV SOLN
60.0000 mg/m2 | Freq: Once | INTRAVENOUS | Status: AC
  Administered 2021-06-01: 130 mg via INTRAVENOUS
  Filled 2021-06-01: qty 13

## 2021-06-01 MED ORDER — SODIUM CHLORIDE 0.9 % IV SOLN: Freq: Once | INTRAVENOUS | Status: AC

## 2021-06-01 NOTE — Patient Instructions (Signed)
[facility] [place]  Discharge Instructions: ?Thank you for choosing [facility] to provide your oncology and hematology care.  ? ?If you have a lab appointment with the [facility], please go directly to the [facility] and check in at the registration area. ? ?Wear comfortable clothing and clothing appropriate for easy access to any Portacath or PICC line.  ? ?We strive to give you quality time with your provider. You may need to reschedule your appointment if you arrive late (15 or more minutes).  Arriving late affects you and other patients whose appointments are after yours.  Also, if you miss three or more appointments without notifying the office, you may be dismissed from the clinic at the provider?s discretion.    ?  ?For prescription refill requests, have your pharmacy contact our office and allow 72 hours for refills to be completed.   ? ?Today you received the following chemotherapy and/or immunotherapy agents Taxotere    ?  ?To help prevent nausea and vomiting after your treatment, we encourage you to take your nausea medication as directed. ? ?BELOW ARE SYMPTOMS THAT SHOULD BE REPORTED IMMEDIATELY: ?*FEVER GREATER THAN 100.4 F (38 ?C) OR HIGHER ?*CHILLS OR SWEATING ?*NAUSEA AND VOMITING THAT IS NOT CONTROLLED WITH YOUR NAUSEA MEDICATION ?*UNUSUAL SHORTNESS OF BREATH ?*UNUSUAL BRUISING OR BLEEDING ?*URINARY PROBLEMS (pain or burning when urinating, or frequent urination) ?*BOWEL PROBLEMS (unusual diarrhea, constipation, pain near the anus) ?TENDERNESS IN MOUTH AND THROAT WITH OR WITHOUT PRESENCE OF ULCERS (sore throat, sores in mouth, or a toothache) ?UNUSUAL RASH, SWELLING OR PAIN  ?UNUSUAL VAGINAL DISCHARGE OR ITCHING  ? ?Items with * indicate a potential emergency and should be followed up as soon as possible or go to the Emergency Department if any problems should occur. ? ?Please show the CHEMOTHERAPY ALERT CARD or IMMUNOTHERAPY ALERT CARD at check-in to the  Emergency Department and triage nurse. ?Should you have questions after your visit or need to cancel or reschedule your appointment, please contact [facility]  [phone] and follow the prompts.  Office hours are 8:00 a.m. to 4:30 p.m. Monday - Friday. Please note that voicemails left after 4:00 p.m. may not be returned until the following business day.  We are closed weekends and major holidays. You have access to a nurse at all times for urgent questions. Please call the main number to the clinic [phone] and follow the prompts. ? ?For any non-urgent questions, you may also contact your provider using MyChart. We now offer e-Visits for anyone 78 and older to request care online for non-urgent symptoms. For details visit [email or web address]. ?  ?Also download the MyChart app! Go to the app store, search "MyChart", open the app, select [facility], and log in with your MyChart username and password. ? ?Due to Covid, a mask is required upon entering the hospital/clinic. If you do not have a mask, one will be given to you upon arrival. For doctor visits, patients may have 1 support person aged 27 or older with them. For treatment visits, patients cannot have anyone with them due to current Covid guidelines and our immunocompromised population.  ?

## 2021-06-01 NOTE — Progress Notes (Signed)
+ ?Hematology and Oncology Follow Up Visit ? ?John [name] ?[id number] ?[birth year] 61 y.o. ?06/01/2021 ? ? 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  ?Current Therapy:        ?Pembrolizumab 200 mg IV every 3 weeks -- s/p cycle 10 -- d/c due to pulmonary toxicity ?Taxotere 35m/m2 IV q 3 weeks -- s/p cycle #4 -- start on 01/29/2021 ? ?  ?Interim History:  Mr. MMinoris here today for follow-up.  He really looks great.  He is off oxygen now.  He is able to walk longer.  He has more stamina. ? ?He went to the [employer]  He really enjoyed himself.  As always and video that he took. ? ?He is not complain of any pain.  He is having no cough or shortness of breath.  He is having no change in bowel or bladder habits. ? ?There is been no issues with his heart.  He has had no palpitations. ? ?There is been no leg swelling.  He has had no headache. ? ?There is been no rashes.  He has had no mouth sores.  There has been no flareups of his arthritis.  He said that for about 4 days after his last treatment, he was having a lot of bony pain.  This just went away. ? ?Overall, I would say his performance status is probably ECOG 1.   ? ?Medications:  ?Allergies as of 06/01/2021   ? ?   Reactions  ? Albuterol Other (See Comments)  ? Patient has had episode of atrial fib following administration of albuterol (tolerates Xopenex well)  ? Atorvastatin Other (See Comments)  ? Cannot take at this time  ? ?  ? ?  ?Medication List  ?  ? ?  ? Accurate as of June 01, 2021  8:30 AM. If you have any questions, ask your nurse or doctor.  ?  ?  ? ?  ? ?STOP taking these medications   ? ?levalbuterol 0.63 MG/3ML nebulizer solution ?Commonly known as: XOPENEX ?Stopped by: PVolanda Napoleon MD ?  ? ?  ? ?TAKE these medications   ? ?acetaminophen 500 MG tablet ?Commonly known as: TYLENOL ?Take 1,000 mg by mouth every 6 (six) hours as needed for mild pain. ?   ?apixaban 5 MG Tabs tablet ?Commonly known as: Eliquis ?Take 1 tablet (5 mg total) by mouth 2 (two) times daily. ?  ?LORazepam 0.5 MG tablet ?Commonly known as: Ativan ?Take 1 tablet (0.5 mg total) by mouth every 6 (six) hours as needed (Nausea or vomiting). ?  ?metoprolol succinate 25 MG 24 hr tablet ?Commonly known as: Toprol XL ?Take one tablet by mouth daily ?What changed:  ?how much to take ?when to take this ?additional instructions ?  ?ondansetron 8 MG tablet ?Commonly known as: Zofran ?Take 1 tablet (8 mg total) by mouth 2 (two) times daily as needed for refractory nausea / vomiting. ?  ?PARoxetine 10 MG tablet ?Commonly known as: PAXIL ?Take 10 mg by mouth at bedtime. ?  ?predniSONE 5 MG tablet ?Commonly known as: DELTASONE ?Take 1 tablet (5 mg total) by mouth daily. ?  ?PriLOSEC OTC 20 MG tablet ?Generic drug: omeprazole ?Take 20 mg by mouth at bedtime. ?  ?prochlorperazine 10 MG tablet ?Commonly known as: COMPAZINE ?Take 1 tablet (10 mg total) by mouth every 6 (six) hours as needed (Nausea or vomiting). ?  ? ?  ? ? ?Allergies:  ?  Allergies  ?Allergen Reactions  ? Albuterol Other (See Comments)  ?  Patient has had episode of atrial fib following administration of albuterol (tolerates Xopenex well)  ? Atorvastatin Other (See Comments)  ?  Cannot take at this time  ? ? ?Past Medical History, Surgical history, Social history, and Family History were reviewed and updated. ? ?Review of Systems: ?Review of Systems  ?Constitutional:  Positive for malaise/fatigue.  ?HENT: Negative.    ?Eyes: Negative.   ?Respiratory:  Positive for cough and shortness of breath.   ?Cardiovascular:  Positive for chest pain.  ?Gastrointestinal: Negative.   ?Genitourinary: Negative.   ?Musculoskeletal:  Positive for joint pain.  ?Skin: Negative.   ?Neurological:  Positive for focal weakness.  ?Endo/Heme/Allergies: Negative.   ?Psychiatric/Behavioral: Negative.    ? ? ?Physical Exam: ? weight is 233 lb (105.7 kg). His oral temperature  is 98.2 ?F (36.8 ?C). His blood pressure is 118/64 and his pulse is 72. His respiration is 18 and oxygen saturation is 97%.  ? ?Wt Readings from Last 3 Encounters:  ?06/01/21 233 lb (105.7 kg)  ?05/08/21 233 lb (105.7 kg)  ?05/05/21 238 lb (108 kg)  ?Vital signs are temperature of 98.4.  Pulse 94.  Blood pressure 116/75.  Weight is 233 pounds. ? ?Physical Exam ?Vitals reviewed.  ?HENT:  ?   Head: Normocephalic and atraumatic.  ?Eyes:  ?   Pupils: Pupils are equal, round, and reactive to light.  ?Cardiovascular:  ?   Rate and Rhythm: Normal rate and regular rhythm.  ?   Heart sounds: Normal heart sounds.  ?Pulmonary:  ?   Effort: Pulmonary effort is normal.  ?   Breath sounds: Normal breath sounds.  ?Abdominal:  ?   General: Bowel sounds are normal.  ?   Palpations: Abdomen is soft.  ?Musculoskeletal:     ?   General: No tenderness or deformity. Normal range of motion.  ?   Cervical back: Normal range of motion.  ?Lymphadenopathy:  ?   Cervical: No cervical adenopathy.  ?Skin: ?   General: Skin is warm and dry.  ?   Findings: No erythema or rash.  ?Neurological:  ?   Mental Status: He is alert and oriented to person, place, and time.  ?Psychiatric:     ?   Behavior: Behavior normal.     ?   Thought Content: Thought content normal.     ?   Judgment: Judgment normal.  ? ? ?Lab Results  ?Component Value Date  ? WBC 7.4 06/01/2021  ? HGB 11.3 (L) 06/01/2021  ? HCT 35.4 (L) 06/01/2021  ? MCV 84.7 06/01/2021  ? PLT 454 (H) 06/01/2021  ? ?No results found for: FERRITIN, IRON, TIBC, UIBC, IRONPCTSAT ?Lab Results  ?Component Value Date  ? RBC 4.18 (L) 06/01/2021  ? ?No results found for: KPAFRELGTCHN, LAMBDASER, KAPLAMBRATIO ?No results found for: IGGSERUM, IGA, IGMSERUM ?No results found for: TOTALPROTELP, ALBUMINELP, A1GS, A2GS, BETS, BETA2SER, GAMS, MSPIKE, SPEI ?  Chemistry   ?   ?Component Value Date/Time  ? NA 139 05/08/2021 0823  ? K 3.8 05/08/2021 0823  ? CL 102 05/08/2021 0823  ? CO2 28 05/08/2021 0823  ? BUN 10  05/08/2021 0823  ? CREATININE 1.10 05/08/2021 0823  ?    ?Component Value Date/Time  ? CALCIUM 9.3 05/08/2021 0823  ? ALKPHOS 87 05/08/2021 0823  ? AST 16 05/08/2021 0823  ? ALT 11 05/08/2021 0823  ? BILITOT 0.7 05/08/2021 1941  ?  ? ? ? ?  Impression and Plan: Mr. Schlabach is a very pleasant 61 yo gentleman with metastatic squamous cell carcinoma of the right lung. Tumor had high PD-L1 score.  ? ?We will go ahead and proceed with his 5th cycle of Taxotere.  I think that we can hold off on doing further scans probably till after his sixth cycle.   ? ?I am just happy that he feels good.  I am so glad that he could go to the concert.  He is still trying to work.  I have to give him total credit for his motivation. ? ?We will plan to get him back in 3 more weeks.   ? ? ?Volanda Napoleon, MD ?4/17/20238:30 AM ? ?

## 2021-06-17 ENCOUNTER — Encounter: Payer: Self-pay | Admitting: Emergency Medicine

## 2021-06-17 ENCOUNTER — Ambulatory Visit (INDEPENDENT_AMBULATORY_CARE_PROVIDER_SITE_OTHER): Payer: 59 | Admitting: Emergency Medicine

## 2021-06-17 DIAGNOSIS — C3400 Malignant neoplasm of unspecified main bronchus: Secondary | ICD-10-CM

## 2021-06-17 DIAGNOSIS — J9611 Chronic respiratory failure with hypoxia: Secondary | ICD-10-CM

## 2021-06-17 DIAGNOSIS — J984 Other disorders of lung: Secondary | ICD-10-CM

## 2021-06-17 DIAGNOSIS — J449 Chronic obstructive pulmonary disease, unspecified: Secondary | ICD-10-CM | POA: Diagnosis not present

## 2021-06-17 DIAGNOSIS — J189 Pneumonia, unspecified organism: Secondary | ICD-10-CM

## 2021-06-17 DIAGNOSIS — J4489 Other specified chronic obstructive pulmonary disease: Secondary | ICD-10-CM

## 2021-06-17 DIAGNOSIS — I4819 Other persistent atrial fibrillation: Secondary | ICD-10-CM | POA: Diagnosis not present

## 2021-06-17 DIAGNOSIS — J939 Pneumothorax, unspecified: Secondary | ICD-10-CM

## 2021-06-17 NOTE — Assessment & Plan Note (Signed)
Now resolved.  Chest tube removed.  Continue to follow ?

## 2021-06-17 NOTE — Assessment & Plan Note (Signed)
On chemotherapy managed by [name].  Planning for surveillance CT scan chest abdomen pelvis in the next few weeks.  We will review ?

## 2021-06-17 NOTE — Assessment & Plan Note (Signed)
Now off amiodarone ?

## 2021-06-17 NOTE — Assessment & Plan Note (Signed)
Plan to continue Anoro, albuterol as needed ?

## 2021-06-17 NOTE — Assessment & Plan Note (Signed)
He has been qualified already for a POC but it has not been obtained.  We will try to look into this, work with his DME company and get the device for him. ?

## 2021-06-17 NOTE — Patient Instructions (Signed)
Please continue your Anoro once daily as you have been taking it. ?Keep your albuterol available use 2 puffs when needed for shortness of breath, chest tightness, wheezing. ?Continue to use your oxygen at 2-3 l/min with heavy exertion.  We will try again to arrange to get you a portable oxygen concentrator.  We will communicate with your DME company ?Continue to follow with [name].  Agree with repeat CT scan of the chest, abdomen, pelvis soon so we can evaluate for chemotherapy response and also improvement in your pneumonitis caused by [facility] ?Follow with Dr [name] in 3 months or sooner if you have any problems. ? ?

## 2021-06-17 NOTE — Progress Notes (Signed)
? ?Subjective:  ? ? Patient ID: John [name], male    DOB: 24-Mar-1960, 61 y.o.   MRN: [id number] ? ?HPI ? ?ROV / Hosp F/u 04/09/21 --John [name] is 61 with a history of stage IVa squamous cell lung cancer involving the right upper lobe.  He also has associated mild obstructive lung disease.  He has been managed on Keytruda but had associated pneumonitis that required a prolonged prednisone taper.  He has chronic hypoxemic respiratory failure and is on 2 L/min.  He was unfortunately admitted 03/30/2021 with acute right pneumothorax that required pigtail catheter placement.  Had a presumed intermittent air leak with better inflation of the right lung with pleural VAC in place even on waterseal.  He went home with an express mini chamber ?Today he reports that his breathing has been stable. He has over 350cc in his express mini chamber, intermittent air leak on cough ? ?Acute OV 04/14/2021 --John [name] returns today after noting significant right upper extremity and right chest swelling.  No real change in his breathing as he does have baseline exertional shortness of breath.  He notes that since I last saw him 2/23 they have had to empty his express mini of approximately 450 cc.  Chest x-ray performed on arrival today shows slight worsening of his right apical pneumothorax and significant subcutaneous air in the pectoral region, right upper extremity. ? ?ROV 06/17/21 --61 year old gentleman who follows with today after complicated course post admission for acute right pneumothorax.  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  His right-sided chest tube is now out.  Today he reports that he is feeling better. He is now on chemotherapy with Dr [name]. He is using Anoro qd, can get SOB with exertion. He uses O2 with heavier exertion 2-3L/min. He has tanks, would prefer a POC and has tried to get one, but have not achieved this  with DME yet. Minimal to no albuterol use. Planning for repeat CT CAP in a few weeks.  ? ? ?Review of Systems ?As per HPI ? ? ?   ?Objective:  ? Physical Exam ?Vitals:  ? 06/17/21 1429  ?BP: 118/76  ?Pulse: 73  ?Temp: 98.5 ?F (36.9 ?C)  ?TempSrc: Oral  ?SpO2: 97%  ?Weight: 234 lb 6.4 oz (106.3 kg)  ?Height: 5\' 10"  (1.778 m)  ? ?Gen: Pleasant, well-nourished, in no distress, depressed affect ? ?ENT: No lesions,  mouth clear,  oropharynx clear, no postnasal drip ? ?Neck: No JVD, no stridor ? ?Lungs: No use of accessory muscles, few insp R crackles,  ? ?Cardiovascular: RRR, heart sounds normal, no murmur or gallops, no peripheral edema ? ?Musculoskeletal: No deformities, significant subcutaneous air and crepitance palpated in the right chest, right upper extremity ? ?Neuro: alert, awake, non focal ? ?Skin: Warm, no lesions or rash  ? ? ? ?   ?Assessment & Plan:  ?Persistent atrial fibrillation ([place]) ?Now off amiodarone ? ?Malignant neoplasm of lung ([place]) ?On chemotherapy managed by [name].  Planning for surveillance CT scan chest abdomen pelvis in the next few weeks.  We will review ? ?COPD with asthma ([place]) ?Plan to continue Anoro, albuterol as needed ? ?Chronic respiratory failure with hypoxia ([facility]) ?He has been qualified already for a POC but it has not been obtained.  We will try to look into this, work with his DME company and get the device for him. ? ?Pneumothorax on  right ?Now resolved.  Chest tube removed.  Continue to follow ? ?Drug-induced pneumonitis ?Off Keytruda.  Planning for surveillance imaging this month ? ? ? ?Baltazar Apo, MD, PhD ?06/17/2021, 2:59 PM ?[facility] ?[phone] or if no answer before 7:00PM call [phone] ?For any issues after 7:00PM please call eLink [phone] ? ?

## 2021-06-17 NOTE — Assessment & Plan Note (Signed)
Off Keytruda.  Planning for surveillance imaging this month ?

## 2021-06-21 NOTE — Progress Notes (Signed)
?Cardiology Office Note:   ? ?Date:  06/23/2021  ? ?ID:  John [name], DOB January 01, 1961, MRN [id number] ? ?PCP:  [name], MD ?  ?[facility] Providers ?Cardiologist:  Fransico Him, MD    ? ?Referring MD: [name], MD  ? ?Chief Complaint: follow-up atrial fibrillation ? ?History of Present Illness:   ? ?John [name] is a very pleasant  61 y.o. 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 ? ?Atrial fibrillation was diagnosed 04/2020.  He was started on amiodarone and converted to NSR.  Anticoagulation was not initially not started given his bleeding risk with tumor pressing on pulmonary artery.  CHA2DS2-VASc of 3.  There was concern about lung toxicity with either amiodarone or Keytruda on CT 07/18/2020 and amiodarone was stopped.  He had recurrent A-fib during hospitalization 09/2020.  Following discussion with pulmonary medicine amiodarone was resumed and he underwent cardioversion on 10/13/2020.  He was placed on Xarelto.  Coronary calcification 2 vessels on CT 09/2020. ? ?Admission 12/2020 for sepsis with acute hypoxic respiratory failure requiring 15 L NRB 2/2 to CAP versus drug-induced pneumonitis from [facility]. CTA of chest without PE.  He was in sinus rhythm on admission.  Went into A-fib RVR on 12/27/2020.  Toprol XL increased to 100 mg daily.  Xarelto changed to Eliquis 2.5 mg twice daily due to incompatibility with itraconazole.  ? ?Last seen in our office 11/19/20 by Ermalinda Barrios, PA. He has been followed [facility] and was last seen 03/09/2021 by Adline Peals, PA in [facility].  He was maintained on amiodarone 200 mg daily, Eliquis 5 mg twice daily, Toprol 25 mg daily and advised to follow-up in 6 months. ? ?Admission 03/2021 for spontaneous pneumothorax, initial improvement with chest tube, return to ED with subcutaneous emphysema in setting of chest tube malfunction.  Removal of chest tube and monitoring through CXR, unfortunately found to have expansion of PTX and advised to return to ED 04/17/2021. Amiodarone has since been discontinued.  ? ?Today, he is here alone for follow-up. States amiodarone was stopped due to lung issues.  He cannot tell a difference in palpitations, symptoms of atrial fibrillation since stopping amiodarone.  Reports he rarely has felt any symptoms of A-fib. Reports he is feeling well today. Persistent pneumothorax eventually resolved on its own. Has chronic dyspnea and had to stop on his way up to our office today due to the distance he had to park from the door.  O2 sat initially 90%, improved to 94% with rest. Continues treatment for lung cancer and uses oxygen at home as needed.  Previously took statin, felt that it increased his muscle pain during chemotherapy.  He denies chest pain, lower extremity edema, fatigue, palpitations, melena, hematuria, hemoptysis, diaphoresis, weakness, presyncope, syncope, orthopnea, and PND. ? ? ?Past Medical History:  ?Diagnosis Date  ? Atrial fibrillation with RVR ([place]) 05/12/2020  ? Dyspnea   ? Family history of adverse reaction to anesthesia   ? brother with seizures had episode under anesthesia.  Patient has seizures as well.  ? GERD (gastroesophageal reflux disease)   ? History of radiation therapy 04/24/2020-05/16/2020  ? IMRT to right lung     Dr [name]  ? PAF (paroxysmal atrial fibrillation) ([place])   ? CHADS2VSAC score 0  ? Rheumatoid aortitis   ? Sepsis ([place]) 10/06/2020  ? Squamous cell lung cancer ([place])   ? ? ?  Past Surgical History:  ?Procedure Laterality Date  ? BRONCHIAL BRUSHINGS  03/27/2020  ? Procedure: BRONCHIAL BRUSHINGS;  Surgeon: [name], MD;  Location: [facility] ENDOSCOPY;  Service: Cardiopulmonary;;  ? BUBBLE STUDY  10/13/2020  ? Procedure: BUBBLE STUDY;  Surgeon: Pixie Casino, MD;  Location: [place];  Service: Cardiovascular;;  ? CARDIOVERSION N/A 10/13/2020  ? Procedure: CARDIOVERSION;   Surgeon: Pixie Casino, MD;  Location: [facility] ENDOSCOPY;  Service: Cardiovascular;  Laterality: N/A;  ? CATARACT EXTRACTION  2016  ? at [facility]  ? FINE NEEDLE ASPIRATION  03/27/2020  ? Procedure: FINE NEEDLE ASPIRATION;  Surgeon: [name], MD;  Location: [facility] ENDOSCOPY;  Service: Cardiopulmonary;;  ? HIP SURGERY Left   ? TEE WITHOUT CARDIOVERSION N/A 10/13/2020  ? Procedure: TRANSESOPHAGEAL ECHOCARDIOGRAM (TEE);  Surgeon: Pixie Casino, MD;  Location: [place];  Service: Cardiovascular;  Laterality: N/A;  ? VIDEO BRONCHOSCOPY WITH ENDOBRONCHIAL ULTRASOUND N/A 03/27/2020  ? Procedure: VIDEO BRONCHOSCOPY WITH ENDOBRONCHIAL ULTRASOUND;  Surgeon: [name], MD;  Location[facility] ENDOSCOPY;  Service: Cardiopulmonary;  Laterality: N/A;  ? ? ?Current Medications: ?Current Meds  ?Medication Sig  ? acetaminophen (TYLENOL) 500 MG tablet Take 1,000 mg by mouth every 6 (six) hours as needed for mild pain.  ? apixaban (ELIQUIS) 5 MG TABS tablet Take 1 tablet (5 mg total) by mouth 2 (two) times daily.  ? ezetimibe (ZETIA) 10 MG tablet Take 1 tablet (10 mg total) by mouth daily.  ? LORazepam (ATIVAN) 0.5 MG tablet Take 1 tablet (0.5 mg total) by mouth every 6 (six) hours as needed (Nausea or vomiting).  ? ondansetron (ZOFRAN) 8 MG tablet Take 1 tablet (8 mg total) by mouth 2 (two) times daily as needed for refractory nausea / vomiting.  ? PARoxetine (PAXIL) 10 MG tablet Take 10 mg by mouth at bedtime.  ? predniSONE (DELTASONE) 5 MG tablet Take 1 tablet (5 mg total) by mouth daily.  ? PRILOSEC OTC 20 MG tablet Take 20 mg by mouth at bedtime.  ? prochlorperazine (COMPAZINE) 10 MG tablet Take 1 tablet (10 mg total) by mouth every 6 (six) hours as needed (Nausea or vomiting).  ? [DISCONTINUED] metoprolol succinate (TOPROL XL) 25 MG 24 hr tablet Take one tablet by mouth daily  ?  ? ?Allergies:   Albuterol and Atorvastatin  ? ?Social History  ? ?Socioeconomic History  ? Marital status: Married  ?  Spouse name: Not on file  ?  Number of children: Not on file  ? Years of education: Not on file  ? Highest education level: Not on file  ?Occupational History  ? Not on file  ?Tobacco Use  ? Smoking status: Former  ?  Packs/day: 1.00  ?  Years: 30.00  ?  Pack years: 30.00  ?  Types: Cigarettes  ?  Quit date: 2018  ?  Years since quitting: 5.3  ? Smokeless tobacco: Never  ? Tobacco comments:  ?  Former smoker 10/28/2020  ?Vaping Use  ? Vaping Use: Never used  ?Substance and Sexual Activity  ? Alcohol use: Not Currently  ? Drug use: Never  ? Sexual activity: Not on file  ?Other Topics Concern  ? Not on file  ?Social History Narrative  ? ** Merged History Encounter **  ?    ? ?Social Determinants of Health  ? ?Financial Resource Strain: Not on file  ?Food Insecurity: Not on file  ?Transportation Needs: Not on file  ?Physical Activity: Not on file  ?Stress: Not  on file  ?Social Connections: Not on file  ?  ? ?Family History: ?The patient's family history includes Arrhythmia in his mother; Heart disease in his father. ? ?ROS:   ?Please see the history of present illness.    ?+ chronic dyspnea ?All other systems reviewed and are negative. ? ?Labs/Other Studies Reviewed:   ? ?The following studies were reviewed today: ? ?TEE 10/13/20 ? ? 1. Left ventricular ejection fraction, by estimation, is 60 to 65%. The  ?left ventricle has normal function.  ? 2. Right ventricular systolic function is normal. The right ventricular  ?size is normal.  ? 3. No left atrial/left atrial appendage thrombus was detected.  ? 4. The mitral valve is grossly normal. Trivial mitral valve  ?regurgitation.  ? 5. The aortic valve is tricuspid. Aortic valve regurgitation is not  ?visualized.  ? 6. Evidence of atrial level shunting detected by color flow Doppler.  ?Agitated saline contrast bubble study was positive with shunting observed  ?within 3-6 cardiac cycles suggestive of interatrial shunt. There is a  ?small patent foramen ovale with  ?bidirectional shunting across atrial  septum.  ? ?Comparison(s): A prior study was performed on 05/12/2020. LVEF 60-65%, afib  ?was noted.  ? ?Conclusion(s)/Recommendation(s): No LA/LAA thrombus identified. Successful  ?cardioversion performed wit

## 2021-06-23 ENCOUNTER — Encounter: Payer: Self-pay | Admitting: Nurse Practitioner

## 2021-06-23 ENCOUNTER — Ambulatory Visit: Payer: 59 | Admitting: Nurse Practitioner

## 2021-06-23 VITALS — BP 118/76 | HR 88 | Ht 70.0 in | Wt 236.4 lb

## 2021-06-23 DIAGNOSIS — I4819 Other persistent atrial fibrillation: Secondary | ICD-10-CM | POA: Diagnosis not present

## 2021-06-23 DIAGNOSIS — D6869 Other thrombophilia: Secondary | ICD-10-CM | POA: Diagnosis not present

## 2021-06-23 DIAGNOSIS — E785 Hyperlipidemia, unspecified: Secondary | ICD-10-CM | POA: Diagnosis not present

## 2021-06-23 DIAGNOSIS — C3491 Malignant neoplasm of unspecified part of right bronchus or lung: Secondary | ICD-10-CM

## 2021-06-23 DIAGNOSIS — I700 Atherosclerosis of aorta: Secondary | ICD-10-CM

## 2021-06-23 MED ORDER — METOPROLOL SUCCINATE ER 25 MG PO TB24: ORAL_TABLET | ORAL | 3 refills | Status: DC

## 2021-06-23 MED ORDER — EZETIMIBE 10 MG PO TABS
10.0000 mg | ORAL_TABLET | Freq: Every day | ORAL | 3 refills | Status: DC
Start: 2021-06-23 — End: 2021-08-07

## 2021-06-23 NOTE — Patient Instructions (Signed)
Medication Instructions:  ? ?START Zetia one (1) tablet by mouth ( 10 mg) daily.  ? ?You may take an additional tablet (25 mg) if Heart Rate is elevated above 100. ? ?*If you need a refill on your cardiac medications before your next appointment, please call your pharmacy* ? ? ?Lab Work: ? ?None ordered. ? ?If you have labs (blood work) drawn today and your tests are completely normal, you will receive your results only by: ?MyChart Message (if you have MyChart) OR ?A paper copy in the mail ?If you have any lab test that is abnormal or we need to change your treatment, we will call you to review the results. ? ? ?Testing/Procedures: ? ?None ordered.  ? ? ?Follow-Up: ?At [facility], you and your health needs are our priority.  As part of our continuing mission to provide you with exceptional heart care, we have created designated Provider Care Teams.  These Care Teams include your primary Cardiologist (physician) and Advanced Practice Providers (APPs -  Physician Assistants and Nurse Practitioners) who all work together to provide you with the care you need, when you need it. ? ?We recommend signing up for the patient portal called "MyChart".  Sign up information is provided on this After Visit Summary.  MyChart is used to connect with patients for Virtual Visits (Telemedicine).  Patients are able to view lab/test results, encounter notes, upcoming appointments, etc.  Non-urgent messages can be sent to your provider as well.   ?To learn more about what you can do with MyChart, go to [email or web address].   ? ?Your next appointment:   ?6 month(s) ? ?The format for your next appointment:   ?In Person ? ?Provider:   ?Fransico Him, MD   ? ? ?Important Information About Sugar ? ? ? ? ?  ?

## 2021-06-29 ENCOUNTER — Inpatient Hospital Stay: Payer: 59 | Attending: Internal Medicine

## 2021-06-29 ENCOUNTER — Inpatient Hospital Stay: Payer: 59

## 2021-06-29 ENCOUNTER — Other Ambulatory Visit: Payer: Self-pay | Admitting: Hematology & Oncology

## 2021-06-29 ENCOUNTER — Encounter: Payer: Self-pay | Admitting: Hematology & Oncology

## 2021-06-29 ENCOUNTER — Inpatient Hospital Stay (HOSPITAL_BASED_OUTPATIENT_CLINIC_OR_DEPARTMENT_OTHER): Payer: 59 | Admitting: Hematology & Oncology

## 2021-06-29 VITALS — BP 123/76 | HR 86 | Temp 97.8°F | Resp 24 | Wt 232.1 lb

## 2021-06-29 DIAGNOSIS — C3411 Malignant neoplasm of upper lobe, right bronchus or lung: Secondary | ICD-10-CM

## 2021-06-29 DIAGNOSIS — C7970 Secondary malignant neoplasm of unspecified adrenal gland: Secondary | ICD-10-CM | POA: Insufficient documentation

## 2021-06-29 DIAGNOSIS — E1169 Type 2 diabetes mellitus with other specified complication: Secondary | ICD-10-CM

## 2021-06-29 DIAGNOSIS — E785 Hyperlipidemia, unspecified: Secondary | ICD-10-CM | POA: Diagnosis not present

## 2021-06-29 DIAGNOSIS — Z5111 Encounter for antineoplastic chemotherapy: Secondary | ICD-10-CM | POA: Diagnosis present

## 2021-06-29 DIAGNOSIS — C779 Secondary and unspecified malignant neoplasm of lymph node, unspecified: Secondary | ICD-10-CM | POA: Diagnosis not present

## 2021-06-29 DIAGNOSIS — C3400 Malignant neoplasm of unspecified main bronchus: Secondary | ICD-10-CM

## 2021-06-29 DIAGNOSIS — C3491 Malignant neoplasm of unspecified part of right bronchus or lung: Secondary | ICD-10-CM

## 2021-06-29 LAB — CMP (CANCER CENTER ONLY)
ALT: 7 U/L (ref 0–44)
AST: 9 U/L — ABNORMAL LOW (ref 15–41)
Albumin: 3.9 g/dL (ref 3.5–5.0)
Alkaline Phosphatase: 78 U/L (ref 38–126)
Anion gap: 10 (ref 5–15)
BUN: 15 mg/dL (ref 6–20)
CO2: 23 mmol/L (ref 22–32)
Calcium: 9.3 mg/dL (ref 8.9–10.3)
Chloride: 101 mmol/L (ref 98–111)
Creatinine: 1.04 mg/dL (ref 0.61–1.24)
GFR, Estimated: >60 mL/min (ref >60)
Glucose, Bld: 165 mg/dL — ABNORMAL HIGH (ref 70–99)
Potassium: 4.3 mmol/L (ref 3.5–5.1)
Sodium: 134 mmol/L — ABNORMAL LOW (ref 135–145)
Total Bilirubin: 0.5 mg/dL (ref 0.3–1.2)
Total Protein: 6.8 g/dL (ref 6.5–8.1)

## 2021-06-29 LAB — LIPID PANEL
Cholesterol: 237 mg/dL — ABNORMAL HIGH (ref 0–200)
HDL: 31 mg/dL — ABNORMAL LOW (ref >40)
LDL Cholesterol: 182 mg/dL — ABNORMAL HIGH (ref 0–99)
Total CHOL/HDL Ratio: 7.6 RATIO
Triglycerides: 121 mg/dL (ref <150)
VLDL: 24 mg/dL (ref 0–40)

## 2021-06-29 LAB — CBC WITH DIFFERENTIAL (CANCER CENTER ONLY)
Abs Immature Granulocytes: 0.06 10*3/uL (ref 0.00–0.07)
Basophils Absolute: 0 10*3/uL (ref 0.0–0.1)
Basophils Relative: 0 %
Eosinophils Absolute: 0 10*3/uL (ref 0.0–0.5)
Eosinophils Relative: 0 %
HCT: 34.5 % — ABNORMAL LOW (ref 39.0–52.0)
Hemoglobin: 11.2 g/dL — ABNORMAL LOW (ref 13.0–17.0)
Immature Granulocytes: 1 %
Lymphocytes Relative: 6 %
Lymphs Abs: 0.8 10*3/uL (ref 0.7–4.0)
MCH: 27.1 pg (ref 26.0–34.0)
MCHC: 32.5 g/dL (ref 30.0–36.0)
MCV: 83.3 fL (ref 80.0–100.0)
Monocytes Absolute: 0.4 10*3/uL (ref 0.1–1.0)
Monocytes Relative: 3 %
Neutro Abs: 10.9 10*3/uL — ABNORMAL HIGH (ref 1.7–7.7)
Neutrophils Relative %: 90 %
Platelet Count: 423 10*3/uL — ABNORMAL HIGH (ref 150–400)
RBC: 4.14 MIL/uL — ABNORMAL LOW (ref 4.22–5.81)
RDW: 16.8 % — ABNORMAL HIGH (ref 11.5–15.5)
WBC Count: 12.1 10*3/uL — ABNORMAL HIGH (ref 4.0–10.5)
nRBC: 0 % (ref 0.0–0.2)

## 2021-06-29 LAB — LACTATE DEHYDROGENASE: LDH: 238 U/L — ABNORMAL HIGH (ref 98–192)

## 2021-06-29 MED ORDER — SODIUM CHLORIDE 0.9 % IV SOLN
60.0000 mg/m2 | Freq: Once | INTRAVENOUS | Status: AC
  Administered 2021-06-29: 130 mg via INTRAVENOUS
  Filled 2021-06-29: qty 13

## 2021-06-29 MED ORDER — SODIUM CHLORIDE 0.9 % IV SOLN
10.0000 mg | Freq: Once | INTRAVENOUS | Status: AC
  Administered 2021-06-29: 10 mg via INTRAVENOUS
  Filled 2021-06-29: qty 1

## 2021-06-29 MED ORDER — SODIUM CHLORIDE 0.9 % IV SOLN: Freq: Once | INTRAVENOUS | Status: AC

## 2021-06-29 NOTE — Progress Notes (Signed)
+ ?Hematology and Oncology Follow Up Visit ? ?Nima Kemppainen ?[id number] ?May 01, 1960 61 y.o. ?06/29/2021 ? ? 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  ?Current Therapy:        ?Pembrolizumab 200 mg IV every 3 weeks -- s/p cycle 10 -- d/c due to pulmonary toxicity ?Taxotere 75m/m2 IV q 3 weeks -- s/p cycle #5 -- start on 01/29/2021 ? ?  ?Interim History:  Mr. MBertholdis here today for follow-up.  He seems to be doing pretty well.  He does wear oxygen on occasion. ? ?He has had no problems with pain.  He has had no nausea or vomiting.  He has had no issues with bowels or bladder. ? ?There has been no issues with palpitations. ? ?He has had no leg swelling.  There is been no rashes.  He has had no issues with swollen lymph nodes. ? ?His last CT scan was done back in March.  As such, we probably need to get the set up for another scan to be done before we see him back. ? ?His appetite seems to be doing pretty well.  He had a good Mother's Day weekend with his family. ? ?Currently, I would have to say that his performance status is probably ECOG 1. ? ?Medications:  ?Allergies as of 06/29/2021   ? ?   Reactions  ? Albuterol Other (See Comments)  ? Patient has had episode of atrial fib following administration of albuterol (tolerates Xopenex well)  ? Atorvastatin Other (See Comments)  ? Cannot take at this time  ? ?  ? ?  ?Medication List  ?  ? ?  ? Accurate as of Jun 29, 2021  1:41 PM. If you have any questions, ask your nurse or doctor.  ?  ?  ? ?  ? ?acetaminophen 500 MG tablet ?Commonly known as: TYLENOL ?Take 1,000 mg by mouth every 6 (six) hours as needed for mild pain. ?  ?Anoro Ellipta 62.5-25 MCG/ACT Aepb ?Generic drug: umeclidinium-vilanterol ?Inhale 1 puff into the lungs daily. ?  ?apixaban 5 MG Tabs tablet ?Commonly known as: Eliquis ?Take 1 tablet (5 mg total) by mouth 2 (two) times daily. ?  ?ezetimibe 10 MG tablet ?Commonly known as:  ZETIA ?Take 1 tablet (10 mg total) by mouth daily. ?  ?LORazepam 0.5 MG tablet ?Commonly known as: Ativan ?Take 1 tablet (0.5 mg total) by mouth every 6 (six) hours as needed (Nausea or vomiting). ?  ?metoprolol succinate 25 MG 24 hr tablet ?Commonly known as: Toprol XL ?Take one tablet by mouth daily. Take additional dose (25 mg) for increase HR over 100. ?  ?ondansetron 8 MG tablet ?Commonly known as: Zofran ?Take 1 tablet (8 mg total) by mouth 2 (two) times daily as needed for refractory nausea / vomiting. ?  ?PARoxetine 10 MG tablet ?Commonly known as: PAXIL ?Take 10 mg by mouth at bedtime. ?  ?predniSONE 5 MG tablet ?Commonly known as: DELTASONE ?Take 1 tablet (5 mg total) by mouth daily. ?  ?PriLOSEC OTC 20 MG tablet ?Generic drug: omeprazole ?Take 20 mg by mouth at bedtime. ?  ?prochlorperazine 10 MG tablet ?Commonly known as: COMPAZINE ?Take 1 tablet (10 mg total) by mouth every 6 (six) hours as needed (Nausea or vomiting). ?  ? ?  ? ? ?Allergies:  ?Allergies  ?Allergen Reactions  ? Albuterol Other (See Comments)  ?  Patient has had episode of atrial fib following administration of  albuterol (tolerates Xopenex well)  ? Atorvastatin Other (See Comments)  ?  Cannot take at this time  ? ? ?Past Medical History, Surgical history, Social history, and Family History were reviewed and updated. ? ?Review of Systems: ?Review of Systems  ?Constitutional:  Positive for malaise/fatigue.  ?HENT: Negative.    ?Eyes: Negative.   ?Respiratory:  Positive for cough and shortness of breath.   ?Cardiovascular:  Positive for chest pain.  ?Gastrointestinal: Negative.   ?Genitourinary: Negative.   ?Musculoskeletal:  Positive for joint pain.  ?Skin: Negative.   ?Neurological:  Positive for focal weakness.  ?Endo/Heme/Allergies: Negative.   ?Psychiatric/Behavioral: Negative.    ? ? ?Physical Exam: ? weight is 232 lb 1.9 oz (105.3 kg). His oral temperature is 97.8 ?F (36.6 ?C). His blood pressure is 123/76 and his pulse is 86. His  respiration is 24 (abnormal) and oxygen saturation is 97%.  ? ?Wt Readings from Last 3 Encounters:  ?06/29/21 232 lb 1.9 oz (105.3 kg)  ?06/23/21 236 lb 6.4 oz (107.2 kg)  ?06/17/21 234 lb 6.4 oz (106.3 kg)  ?Vital signs are temperature of 98.4.  Pulse 94.  Blood pressure 116/75.  Weight is 233 pounds. ? ?Physical Exam ?Vitals reviewed.  ?HENT:  ?   Head: Normocephalic and atraumatic.  ?Eyes:  ?   Pupils: Pupils are equal, round, and reactive to light.  ?Cardiovascular:  ?   Rate and Rhythm: Normal rate and regular rhythm.  ?   Heart sounds: Normal heart sounds.  ?Pulmonary:  ?   Effort: Pulmonary effort is normal.  ?   Breath sounds: Normal breath sounds.  ?Abdominal:  ?   General: Bowel sounds are normal.  ?   Palpations: Abdomen is soft.  ?Musculoskeletal:     ?   General: No tenderness or deformity. Normal range of motion.  ?   Cervical back: Normal range of motion.  ?Lymphadenopathy:  ?   Cervical: No cervical adenopathy.  ?Skin: ?   General: Skin is warm and dry.  ?   Findings: No erythema or rash.  ?Neurological:  ?   Mental Status: He is alert and oriented to person, place, and time.  ?Psychiatric:     ?   Behavior: Behavior normal.     ?   Thought Content: Thought content normal.     ?   Judgment: Judgment normal.  ? ? ?Lab Results  ?Component Value Date  ? WBC 12.1 (H) 06/29/2021  ? HGB 11.2 (L) 06/29/2021  ? HCT 34.5 (L) 06/29/2021  ? MCV 83.3 06/29/2021  ? PLT 423 (H) 06/29/2021  ? ?No results found for: FERRITIN, IRON, TIBC, UIBC, IRONPCTSAT ?Lab Results  ?Component Value Date  ? RBC 4.14 (L) 06/29/2021  ? ?No results found for: KPAFRELGTCHN, LAMBDASER, KAPLAMBRATIO ?No results found for: IGGSERUM, IGA, IGMSERUM ?No results found for: TOTALPROTELP, ALBUMINELP, A1GS, A2GS, BETS, BETA2SER, GAMS, MSPIKE, SPEI ?  Chemistry   ?   ?Component Value Date/Time  ? NA 134 (L) 06/29/2021 0754  ? K 4.3 06/29/2021 0754  ? CL 101 06/29/2021 0754  ? CO2 23 06/29/2021 0754  ? BUN 15 06/29/2021 0754  ? CREATININE 1.04  06/29/2021 0754  ?    ?Component Value Date/Time  ? CALCIUM 9.3 06/29/2021 0754  ? ALKPHOS 78 06/29/2021 0754  ? AST 9 (L) 06/29/2021 0754  ? ALT 7 06/29/2021 0754  ? BILITOT 0.5 06/29/2021 0754  ?  ? ? ? ?Impression and Plan: Mr. Mabey is a very pleasant 61  yo gentleman with metastatic squamous cell carcinoma of the right lung. Tumor had high PD-L1 score.  ? ?We will proceed with his sixth cycle of Taxotere.  After this cycle, I will then plan on scanning him.  Hopefully, we will see that he is responding. ? ?I just want his quality of life to be good.  Hopefully this means he will be off oxygen.  I think the more he can be off oxygen, the half.  He will be in more functional he will get. ? ?We will plan to have him come back in another 3 weeks.  We will plan to scan him right before we see him back. ? ? ?Volanda Napoleon, MD ?5/15/20231:41 PM ? ?

## 2021-06-29 NOTE — Patient Instructions (Signed)
[facility]  Discharge Instructions: ?Thank you for choosing [facility] to provide your oncology and hematology care.  ? ?If you have a lab appointment with the [facility], please go directly to the [facility] and check in at the registration area. ? ?Wear comfortable clothing and clothing appropriate for easy access to any Portacath or PICC line.  ? ?We strive to give you quality time with your provider. You may need to reschedule your appointment if you arrive late (15 or more minutes).  Arriving late affects you and other patients whose appointments are after yours.  Also, if you miss three or more appointments without notifying the office, you may be dismissed from the clinic at the provider?s discretion.    ?  ?For prescription refill requests, have your pharmacy contact our office and allow 72 hours for refills to be completed.   ? ?Today you received the following chemotherapy and/or immunotherapy agents Taxotere.    ?  ?To help prevent nausea and vomiting after your treatment, we encourage you to take your nausea medication as directed. ? ?BELOW ARE SYMPTOMS THAT SHOULD BE REPORTED IMMEDIATELY: ?*FEVER GREATER THAN 100.4 F (38 ?C) OR HIGHER ?*CHILLS OR SWEATING ?*NAUSEA AND VOMITING THAT IS NOT CONTROLLED WITH YOUR NAUSEA MEDICATION ?*UNUSUAL SHORTNESS OF BREATH ?*UNUSUAL BRUISING OR BLEEDING ?*URINARY PROBLEMS (pain or burning when urinating, or frequent urination) ?*BOWEL PROBLEMS (unusual diarrhea, constipation, pain near the anus) ?TENDERNESS IN MOUTH AND THROAT WITH OR WITHOUT PRESENCE OF ULCERS (sore throat, sores in mouth, or a toothache) ?UNUSUAL RASH, SWELLING OR PAIN  ?UNUSUAL VAGINAL DISCHARGE OR ITCHING  ? ?Items with * indicate a potential emergency and should be followed up as soon as possible or go to the Emergency Department if any problems should occur. ? ?Please show the CHEMOTHERAPY ALERT CARD or IMMUNOTHERAPY ALERT CARD at check-in to the  Emergency Department and triage nurse. ?Should you have questions after your visit or need to cancel or reschedule your appointment, please contact [facility]  [phone] and follow the prompts.  Office hours are 8:00 a.m. to 4:30 p.m. Monday - Friday. Please note that voicemails left after 4:00 p.m. may not be returned until the following business day.  We are closed weekends and major holidays. You have access to a nurse at all times for urgent questions. Please call the main number to the clinic [phone] and follow the prompts. ? ?For any non-urgent questions, you may also contact your provider using MyChart. We now offer e-Visits for anyone 75 and older to request care online for non-urgent symptoms. For details visit [email or web address]. ?  ?Also download the MyChart app! Go to the app store, search "MyChart", open the app, select [facility], and log in with your MyChart username and password. ? ?Due to Covid, a mask is required upon entering the hospital/clinic. If you do not have a mask, one will be given to you upon arrival. For doctor visits, patients may have 1 support person aged 16 or older with them. For treatment visits, patients cannot have anyone with them due to current Covid guidelines and our immunocompromised population.  ?

## 2021-06-30 DIAGNOSIS — E785 Hyperlipidemia, unspecified: Secondary | ICD-10-CM

## 2021-06-30 NOTE — Telephone Encounter (Signed)
John Margarita, MD  [name], RN ?Refer patient to lipid clinic due to marked hyperlipidemia  ?

## 2021-07-01 ENCOUNTER — Other Ambulatory Visit: Payer: Self-pay | Admitting: Family

## 2021-07-01 DIAGNOSIS — C3491 Malignant neoplasm of unspecified part of right bronchus or lung: Secondary | ICD-10-CM

## 2021-07-01 DIAGNOSIS — C3400 Malignant neoplasm of unspecified main bronchus: Secondary | ICD-10-CM

## 2021-07-14 ENCOUNTER — Ambulatory Visit
Admission: RE | Admit: 2021-07-14 | Discharge: 2021-07-14 | Disposition: A | Discharge status: Home / Self Care | Payer: 59 | Source: Ambulatory Visit | Attending: Family | Admitting: Family

## 2021-07-14 DIAGNOSIS — C3400 Malignant neoplasm of unspecified main bronchus: Secondary | ICD-10-CM

## 2021-07-14 DIAGNOSIS — C3491 Malignant neoplasm of unspecified part of right bronchus or lung: Secondary | ICD-10-CM

## 2021-07-14 MED ORDER — IOPAMIDOL (ISOVUE-300) INJECTION 61%
100.0000 mL | Freq: Once | INTRAVENOUS | Status: AC | PRN
  Administered 2021-07-14: 100 mL via INTRAVENOUS

## 2021-07-20 ENCOUNTER — Inpatient Hospital Stay (HOSPITAL_BASED_OUTPATIENT_CLINIC_OR_DEPARTMENT_OTHER): Payer: 59 | Admitting: Hematology & Oncology

## 2021-07-20 ENCOUNTER — Emergency Department (HOSPITAL_COMMUNITY)
Admission: EM | Admit: 2021-07-20 | Discharge: 2021-07-21 | Disposition: A | Discharge status: Home / Self Care | Payer: 59 | Attending: Emergency Medicine | Admitting: Emergency Medicine

## 2021-07-20 ENCOUNTER — Encounter (HOSPITAL_COMMUNITY): Payer: Self-pay

## 2021-07-20 ENCOUNTER — Other Ambulatory Visit: Payer: Self-pay

## 2021-07-20 ENCOUNTER — Inpatient Hospital Stay: Payer: 59 | Attending: Internal Medicine

## 2021-07-20 ENCOUNTER — Inpatient Hospital Stay: Payer: 59

## 2021-07-20 ENCOUNTER — Emergency Department (HOSPITAL_COMMUNITY): Payer: 59

## 2021-07-20 ENCOUNTER — Encounter: Payer: Self-pay | Admitting: Hematology & Oncology

## 2021-07-20 VITALS — BP 93/69 | HR 95 | Temp 98.1°F | Resp 20 | Ht 71.0 in | Wt 226.0 lb

## 2021-07-20 DIAGNOSIS — C3411 Malignant neoplasm of upper lobe, right bronchus or lung: Secondary | ICD-10-CM

## 2021-07-20 DIAGNOSIS — J849 Interstitial pulmonary disease, unspecified: Secondary | ICD-10-CM | POA: Insufficient documentation

## 2021-07-20 DIAGNOSIS — J9611 Chronic respiratory failure with hypoxia: Secondary | ICD-10-CM | POA: Insufficient documentation

## 2021-07-20 DIAGNOSIS — C3401 Malignant neoplasm of right main bronchus: Secondary | ICD-10-CM

## 2021-07-20 DIAGNOSIS — Z7901 Long term (current) use of anticoagulants: Secondary | ICD-10-CM | POA: Insufficient documentation

## 2021-07-20 DIAGNOSIS — D649 Anemia, unspecified: Secondary | ICD-10-CM

## 2021-07-20 DIAGNOSIS — Z20822 Contact with and (suspected) exposure to covid-19: Secondary | ICD-10-CM | POA: Insufficient documentation

## 2021-07-20 DIAGNOSIS — Z85118 Personal history of other malignant neoplasm of bronchus and lung: Secondary | ICD-10-CM | POA: Insufficient documentation

## 2021-07-20 DIAGNOSIS — R0602 Shortness of breath: Secondary | ICD-10-CM | POA: Diagnosis present

## 2021-07-20 LAB — CMP (CANCER CENTER ONLY)
ALT: 11 U/L (ref 0–44)
AST: 16 U/L (ref 15–41)
Albumin: 3.3 g/dL — ABNORMAL LOW (ref 3.5–5.0)
Alkaline Phosphatase: 94 U/L (ref 38–126)
Anion gap: 8 (ref 5–15)
BUN: 9 mg/dL (ref 6–20)
CO2: 29 mmol/L (ref 22–32)
Calcium: 9 mg/dL (ref 8.9–10.3)
Chloride: 101 mmol/L (ref 98–111)
Creatinine: 0.98 mg/dL (ref 0.61–1.24)
GFR, Estimated: >60 mL/min (ref >60)
Glucose, Bld: 107 mg/dL — ABNORMAL HIGH (ref 70–99)
Potassium: 4.1 mmol/L (ref 3.5–5.1)
Sodium: 138 mmol/L (ref 135–145)
Total Bilirubin: 0.6 mg/dL (ref 0.3–1.2)
Total Protein: 6.1 g/dL — ABNORMAL LOW (ref 6.5–8.1)

## 2021-07-20 LAB — BASIC METABOLIC PANEL
Anion gap: 8 (ref 5–15)
BUN: 11 mg/dL (ref 6–20)
CO2: 27 mmol/L (ref 22–32)
Calcium: 8.3 mg/dL — ABNORMAL LOW (ref 8.9–10.3)
Chloride: 102 mmol/L (ref 98–111)
Creatinine, Ser: 0.92 mg/dL (ref 0.61–1.24)
GFR, Estimated: >60 mL/min (ref >60)
Glucose, Bld: 98 mg/dL (ref 70–99)
Potassium: 3.6 mmol/L (ref 3.5–5.1)
Sodium: 137 mmol/L (ref 135–145)

## 2021-07-20 LAB — BLOOD GAS, ARTERIAL
Acid-Base Excess: 3.5 mmol/L — ABNORMAL HIGH (ref 0.0–2.0)
Bicarbonate: 27.8 mmol/L (ref 20.0–28.0)
Drawn by: 59133
O2 Content: 4 L/min
O2 Saturation: 99 %
Patient temperature: 36.7
pCO2 arterial: 39 mmHg (ref 32–48)
pH, Arterial: 7.46 — ABNORMAL HIGH (ref 7.35–7.45)
pO2, Arterial: 80 mmHg — ABNORMAL LOW (ref 83–108)

## 2021-07-20 LAB — CBC WITH DIFFERENTIAL (CANCER CENTER ONLY)
Abs Immature Granulocytes: 0.06 10*3/uL (ref 0.00–0.07)
Basophils Absolute: 0 10*3/uL (ref 0.0–0.1)
Basophils Relative: 1 %
Eosinophils Absolute: 0.1 10*3/uL (ref 0.0–0.5)
Eosinophils Relative: 1 %
HCT: 32.8 % — ABNORMAL LOW (ref 39.0–52.0)
Hemoglobin: 10.3 g/dL — ABNORMAL LOW (ref 13.0–17.0)
Immature Granulocytes: 1 %
Lymphocytes Relative: 9 %
Lymphs Abs: 0.5 10*3/uL — ABNORMAL LOW (ref 0.7–4.0)
MCH: 26.2 pg (ref 26.0–34.0)
MCHC: 31.4 g/dL (ref 30.0–36.0)
MCV: 83.5 fL (ref 80.0–100.0)
Monocytes Absolute: 0.4 10*3/uL (ref 0.1–1.0)
Monocytes Relative: 7 %
Neutro Abs: 5 10*3/uL (ref 1.7–7.7)
Neutrophils Relative %: 81 %
Platelet Count: 438 10*3/uL — ABNORMAL HIGH (ref 150–400)
RBC: 3.93 MIL/uL — ABNORMAL LOW (ref 4.22–5.81)
RDW: 17 % — ABNORMAL HIGH (ref 11.5–15.5)
WBC Count: 6.1 10*3/uL (ref 4.0–10.5)
nRBC: 0 % (ref 0.0–0.2)

## 2021-07-20 LAB — LACTATE DEHYDROGENASE: LDH: 445 U/L — ABNORMAL HIGH (ref 98–192)

## 2021-07-20 LAB — BRAIN NATRIURETIC PEPTIDE: B Natriuretic Peptide: 23.1 pg/mL (ref 0.0–100.0)

## 2021-07-20 MED ORDER — PREDNISONE 20 MG PO TABS: 60.0000 mg | ORAL_TABLET | Freq: Every day | ORAL | 2 refills | Status: DC

## 2021-07-20 MED ORDER — PREDNISONE 20 MG PO TABS: 40.0000 mg | ORAL_TABLET | Freq: Two times a day (BID) | ORAL | 0 refills | Status: AC

## 2021-07-20 MED ORDER — METHYLPREDNISOLONE SODIUM SUCC 125 MG IJ SOLR
125.0000 mg | Freq: Once | INTRAMUSCULAR | Status: AC
  Administered 2021-07-21: 125 mg via INTRAVENOUS
  Filled 2021-07-20: qty 2

## 2021-07-20 NOTE — ED Provider Triage Note (Signed)
Emergency Medicine Provider Triage Evaluation Note  Jovane Foutz , a 61 y.o. male  was evaluated in triage.  Pt complains of shortness of breath for the last 3 days.  Patient has history of lung cancer, currently undergoing chemotherapy.  Patient states that he recently had CT scan done which seem to exacerbate his shortness of breath.  The patient reports that he typically does not wear oxygen at home however recently he has been having to wear oxygen at home at 5 L/min via nasal cannula.  Patient denies any recent fevers, chest pain, leg swelling, nausea, vomiting, lightheadedness or dizziness.  Review of Systems  Positive:  Negative:   Physical Exam  BP 105/72 (BP Location: Left Arm)   Pulse 84   Temp 98.1 F (36.7 C) (Oral)   Resp 16   SpO2 92% Comment: 4 L O2 Gen:   Awake, no distress   Resp:  Normal effort  MSK:   Moves extremities without difficulty  Other:    Medical Decision Making  Medically screening exam initiated at 9:17 PM.  Appropriate orders placed.  Taiga Lupinacci was informed that the remainder of the evaluation will be completed by another provider, this initial triage assessment does not replace that evaluation, and the importance of remaining in the ED until their evaluation is complete.     Azucena Cecil, PA-C 07/20/21 2118

## 2021-07-20 NOTE — Progress Notes (Signed)
ABG collected and send to lab for analysis. Lab notified.  

## 2021-07-20 NOTE — ED Triage Notes (Signed)
Pt reports with worsening SHOB x 2 weeks. Pt states that his O2 has been in the 70's at home on 3L O2. Pt reports pulling a muscle in his lower back this morning and it hurts when he breathes.

## 2021-07-20 NOTE — ED Provider Notes (Signed)
[place] DEPT Provider Note   CSN: [id number] Arrival date & time: 07/20/21  2036     History  Chief Complaint  Patient presents with   Shortness of Breath    John [name] is a 61 y.o. male.  HPI     61 year old male comes in with chief complaint of shortness of breath. 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  Patient indicates that he has been requiring oxygen for the last 3 weeks.  In the past, he has had oxygen requirement, but he was able to wean himself off of it.  He has chronic cough, no new chest pain.  Patient has been seen by his oncologist recently, they did not think that the chemotherapy was the cause for his worsening lungs on CT scan.  He saw pulmonologist early in May, at that time he was feeling fine.  He has an appointment coming up on Wednesday.  He has not been feeling great with the oxygen need and decided to come to the ER to see if he needed further diagnostic work-up or clarification.  Review of systems negative for any fevers or chills.  No sick contacts.   Home Medications Prior to Admission medications   Medication Sig Start Date End Date Taking? Authorizing Provider  predniSONE (DELTASONE) 20 MG tablet Take 2 tablets (40 mg total) by mouth 2 (two) times daily with a meal for 5 days. 07/20/21 07/25/21 Yes Varney Biles, MD  acetaminophen (TYLENOL) 500 MG tablet Take 1,000 mg by mouth every 6 (six) hours as needed for mild pain.    [provider]  Celedonio Miyamoto 62.5-25 MCG/ACT AEPB Inhale 1 puff into the lungs daily. 06/18/21   [provider]  apixaban (ELIQUIS) 5 MG TABS tablet Take 1 tablet (5 mg total) by mouth 2 (two) times daily. 01/05/21   Fenton, Clint R, PA  ezetimibe (ZETIA) 10 MG tablet Take 1 tablet (10 mg total) by mouth daily. Patient not taking: Reported on 06/29/2021 06/23/21   Swinyer, Lanice Schwab, NP  metoprolol succinate (TOPROL  XL) 25 MG 24 hr tablet Take one tablet by mouth daily. Take additional dose (25 mg) for increase HR over 100. 06/23/21   Swinyer, Lanice Schwab, NP  PARoxetine (PAXIL) 10 MG tablet Take 10 mg by mouth at bedtime. 01/25/20   [provider]  PRILOSEC OTC 20 MG tablet Take 20 mg by mouth at bedtime. 11/13/19   [provider]  prochlorperazine (COMPAZINE) 10 MG tablet Take 1 tablet (10 mg total) by mouth every 6 (six) hours as needed (Nausea or vomiting). Patient not taking: Reported on 06/29/2021 01/29/21 07/20/21  Volanda Napoleon, MD      Allergies    Albuterol and Atorvastatin    Review of Systems   Review of Systems  All other systems reviewed and are negative.  Physical Exam Updated Vital Signs BP 105/69   Pulse 75   Temp 98.1 F (36.7 C) (Oral)   Resp (!) 25   SpO2 93%  Physical Exam Vitals and nursing note reviewed.  Constitutional:      Appearance: He is well-developed.  HENT:     Head: Atraumatic.  Cardiovascular:     Rate and Rhythm: Normal rate.  Pulmonary:     Effort: Pulmonary effort is normal.     Breath sounds: Examination of the right-middle field reveals rales. Examination of the left-middle field reveals rales. Examination of the right-lower  field reveals rales. Examination of the left-lower field reveals rales. Rales present. No decreased breath sounds, wheezing or rhonchi.  Musculoskeletal:     Cervical back: Neck supple.  Skin:    General: Skin is warm.  Neurological:     Mental Status: He is alert and oriented to person, place, and time.    ED Results / Procedures / Treatments   Labs (all labs ordered are listed, but only abnormal results are displayed) Labs Reviewed  BASIC METABOLIC PANEL - Abnormal; Notable for the following components:      Result Value   Calcium 8.3 (*)    All other components within normal limits  BLOOD GAS, ARTERIAL - Abnormal; Notable for the following components:   pH, Arterial 7.46 (*)    pO2, Arterial 80 (*)     Acid-Base Excess 3.5 (*)    All other components within normal limits  SARS CORONAVIRUS 2 BY RT PCR  BRAIN NATRIURETIC PEPTIDE  CBC WITH DIFFERENTIAL/PLATELET    EKG EKG Interpretation  Date/Time:  Monday July 20 2021 21:50:43 EDT Ventricular Rate:  71 PR Interval:  156 QRS Duration: 100 QT Interval:  420 QTC Calculation: 457 R Axis:   -61 Text Interpretation: Sinus rhythm Atrial premature complex Left anterior fascicular block Low voltage, precordial leads Abnormal R-wave progression, late transition since last tracing no significant change Confirmed by Daleen Bo [phone]) on 07/20/2021 10:39:07 PM  Radiology DG Chest 2 View  Result Date: 07/20/2021 CLINICAL DATA:  Shortness of breath. Progressive shortness of breath for 2 weeks. History of lung cancer. EXAM: CHEST - 2 VIEW COMPARISON:  Chest CT 07/14/2021, most recent radiograph 05/05/2021 FINDINGS: Low lung volumes. Stable heart size and mediastinal contours. Right perihilar airspace opacity corresponding to post treatment changes is stable from recent CT. Underlying emphysema. Ill-defined interstitial opacity which was better characterized on recent CT. No acute confluent consolidation. There is no pleural fluid or pneumothorax. IMPRESSION: 1. Unchanged right perihilar airspace opacity corresponding to post treatment changes on recent CT. 2. Diffuse ill-defined interstitial opacity which was better characterized on recent CT. 3. Emphysema. Electronically Signed   By: Keith Rake M.D.   On: 07/20/2021 21:39    Procedures Procedures    Medications Ordered in ED Medications  methylPREDNISolone sodium succinate (SOLU-MEDROL) 125 mg/2 mL injection 125 mg (has no administration in time range)    ED Course/ Medical Decision Making/ A&P                           Medical Decision Making Amount and/or Complexity of Data Reviewed Labs: ordered.  Risk Prescription drug management.   This patient presents to the ED with  chief complaint(s) of persistent shortness of breath requiring oxygen over the last few weeks with pertinent past medical history of advanced lung cancer, permanent A-fib, anemia and prior pneumothorax which further complicates the presenting complaint. The complaint involves an extensive differential diagnosis and also carries with it a high risk of complications and morbidity.    The differential diagnosis includes : Pneumothorax, pneumonia, pleural effusion, pulm edema, worsening cancer.  The initial plan is to x-ray of the chest, basic blood work-up. Patient does not have any new respiratory change in the last 24 to 48 hours, he has pulmonary doctor follow-up coming up on Wednesday, in 2 days.  Patient also denies any new fevers, chills, new cough.   Additional history obtained: Additional history obtained from spouse Records reviewed previous admission documents, Primary  Care Documents, and oncology and pulmonary notes  Independent labs interpretation:  The following labs were independently interpreted: Normal BNP, ABG on 3 L of oxygen showing PO2 of 80. CBC was lost by the lab.  I do not think a repeat blood draw is needed, as I do not think patient has acute anemia.  Independent visualization of imaging: - I independently visualized the following imaging with scope of interpretation limited to determining acute life threatening conditions related to emergency care: X-ray of the chest, which revealed diffuse haziness in both lung fields, consistent with the lung exam finding of rales.  I also reviewed patient's CT scan that was done in May, where there was concerns for increasing/worsening fibrosis.  Consultation: - Consulted or discussed management/test interpretation w/ external professional: I consulted pulmonary critical care.  I spoke with Dr. Carlis Abbott.  She indicated that in this setting, most likely patient is having interstitial lung disease flareup -and that typically the  treatment is steroids.  I reassessed the patient.  He thinks he can wait till Wednesday.  For now the plan will be therefore to start him on 40 mg twice daily prednisone (as per Dr. Ainsley Spinner recommendation). Strict ER return precautions have been discussed, and patient is agreeing with the plan and is comfortable with the workup done and the recommendations from the ER.   Consideration for admission or further workup: Considered admission, but patient is not acutely getting worse.  He has an appointment coming up on Wednesday.  He is reliable and has some social support.  Final Clinical Impression(s) / ED Diagnoses Final diagnoses:  ILD (interstitial lung disease) ([place])  Chronic respiratory failure with hypoxia ([place])    Rx / DC Orders ED Discharge Orders          Ordered    predniSONE (DELTASONE) 20 MG tablet  2 times daily with meals        07/20/21 2344              Varney Biles, MD 07/20/21 2353

## 2021-07-20 NOTE — Discharge Instructions (Signed)
Please follow-up with pulmonary team on Wednesday as planned. We are prescribing you with prednisone 40 mg twice a day for the next 5 days.  Please discuss this dosing with the pulmonary team, so that if you need to be on steroids for longer period of time they can provide further medicines for  Please return to the ER if your breathing gets worse prior to the appointment.

## 2021-07-20 NOTE — Progress Notes (Signed)
+ Hematology and Oncology Follow Up Visit  John [name] [id number] 09/30/1960 61 y.o. 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 MNafzigeris here today for follow-up.  He is not doing well at all.  He is having problems breathing.  He says when he takes his oxygen off, his oxygen saturation goes down to the 70s.  He has been having problems for the past 3 weeks.  We did do a CT scan on him.  This showed that he had progression of a right adrenal met.  He now measures 5.8 x 4.3 cm.  He seems to have a lot of issues with his lungs respect with respect to interstitial lung changes and fibrosis.  I do not see anything in the lungs that looks like obvious cancer.  However, there is always concern.  There is really no way to prove this unless he has a biopsy.  He is having a hard time getting into Pulmonary Medicine to be evaluated.  He needs to have his prednisone increased.  He is on I think 5 or 10 mg a day.  We need to get him on 60 mg a day.  He says this does make him feel a bit better.  He says he cannot use nebulizers because he was told by Pulmonary Medicine that this may affect his pneumothorax that he has had in the past.  He has had no cough.  He has had no hemoptysis.  He is on Eliquis.  His appetite is down.  He is lost a little bit of weight.  He has had no diarrhea.  So far, the atrial fibrillation has not caused too many problems for him.  Overall, I would have to say that his performance status is probably ECOG 2.     Medications:  Allergies as of 07/20/2021       Reactions   Albuterol Other (See Comments)   Patient has had episode of atrial  fib following administration of albuterol (tolerates Xopenex well)   Atorvastatin Other (See Comments)   Cannot take at this time        Medication List        Accurate as of July 20, 2021  1:21 PM. If you have any questions, ask your nurse or doctor.          acetaminophen 500 MG tablet Commonly known as: TYLENOL Take 1,000 mg by mouth every 6 (six) hours as needed for mild pain.   Anoro Ellipta 62.5-25 MCG/ACT Aepb Generic drug: umeclidinium-vilanterol Inhale 1 puff into the lungs daily.   apixaban 5 MG Tabs tablet Commonly known as: Eliquis Take 1 tablet (5 mg total) by mouth 2 (two) times daily.   dexamethasone 4 MG tablet Commonly known as: DECADRON TAKE 2 TABLETS(8 MG) BY MOUTH TWICE DAILY. START THE DAY BEFORE TAXOTERE. THEN DAILY AFTER CHEMOTHERAPY FOR 2 DAYS   ezetimibe 10 MG tablet Commonly known as: ZETIA Take 1 tablet (10 mg total) by mouth daily.   LORazepam 0.5 MG tablet Commonly known as: Ativan Take 1 tablet (0.5 mg total) by mouth every 6 (six) hours as  needed (Nausea or vomiting).   metoprolol succinate 25 MG 24 hr tablet Commonly known as: Toprol XL Take one tablet by mouth daily. Take additional dose (25 mg) for increase HR over 100.   ondansetron 8 MG tablet Commonly known as: Zofran Take 1 tablet (8 mg total) by mouth 2 (two) times daily as needed for refractory nausea / vomiting.   PARoxetine 10 MG tablet Commonly known as: PAXIL Take 10 mg by mouth at bedtime.   predniSONE 5 MG tablet Commonly known as: DELTASONE Take 1 tablet (5 mg total) by mouth daily.   PriLOSEC OTC 20 MG tablet Generic drug: omeprazole Take 20 mg by mouth at bedtime.   prochlorperazine 10 MG tablet Commonly known as: COMPAZINE Take 1 tablet (10 mg total) by mouth every 6 (six) hours as needed (Nausea or vomiting).        Allergies:  Allergies  Allergen Reactions   Albuterol Other (See Comments)    Patient has had episode of atrial fib following  administration of albuterol (tolerates Xopenex well)   Atorvastatin Other (See Comments)    Cannot take at this time    Past Medical History, Surgical history, Social history, and Family History were reviewed and updated.  Review of Systems: Review of Systems  Constitutional:  Positive for malaise/fatigue.  HENT: Negative.    Eyes: Negative.   Respiratory:  Positive for cough and shortness of breath.   Cardiovascular:  Positive for chest pain.  Gastrointestinal: Negative.   Genitourinary: Negative.   Musculoskeletal:  Positive for joint pain.  Skin: Negative.   Neurological:  Positive for focal weakness.  Endo/Heme/Allergies: Negative.   Psychiatric/Behavioral: Negative.      Physical Exam:  height is '5\' 11"'  (1.803 m) and weight is 226 lb (102.5 kg). His oral temperature is 98.1 F (36.7 C). His blood pressure is 93/69 and his pulse is 95. His respiration is 20 and oxygen saturation is 91%.   Wt Readings from Last 3 Encounters:  07/20/21 226 lb (102.5 kg)  06/29/21 232 lb 1.9 oz (105.3 kg)  06/23/21 236 lb 6.4 oz (107.2 kg)  Vital signs are temperature of 98.4.  Pulse 94.  Blood pressure 116/75.  Weight is 233 pounds.  Physical Exam Vitals reviewed.  HENT:     Head: Normocephalic and atraumatic.  Eyes:     Pupils: Pupils are equal, round, and reactive to light.  Cardiovascular:     Rate and Rhythm: Normal rate and regular rhythm.     Heart sounds: Normal heart sounds.  Pulmonary:     Effort: Pulmonary effort is normal.     Breath sounds: Normal breath sounds.  Abdominal:     General: Bowel sounds are normal.     Palpations: Abdomen is soft.  Musculoskeletal:        General: No tenderness or deformity. Normal range of motion.     Cervical back: Normal range of motion.  Lymphadenopathy:     Cervical: No cervical adenopathy.  Skin:    General: Skin is warm and dry.     Findings: No erythema or rash.  Neurological:     Mental Status: He is alert and oriented to  person, place, and time.  Psychiatric:        Behavior: Behavior normal.        Thought Content: Thought content normal.        Judgment: Judgment normal.    Lab Results  Component Value Date   WBC 6.1 07/20/2021  HGB 10.3 (L) 07/20/2021   HCT 32.8 (L) 07/20/2021   MCV 83.5 07/20/2021   PLT 438 (H) 07/20/2021   No results found for: FERRITIN, IRON, TIBC, UIBC, IRONPCTSAT Lab Results  Component Value Date   RBC 3.93 (L) 07/20/2021   No results found for: KPAFRELGTCHN, LAMBDASER, KAPLAMBRATIO No results found for: IGGSERUM, IGA, IGMSERUM No results found for: Odetta Pink, SPEI   Chemistry      Component Value Date/Time   NA 138 07/20/2021 1204   K 4.1 07/20/2021 1204   CL 101 07/20/2021 1204   CO2 29 07/20/2021 1204   BUN 9 07/20/2021 1204   CREATININE 0.98 07/20/2021 1204      Component Value Date/Time   CALCIUM 9.0 07/20/2021 1204   ALKPHOS 94 07/20/2021 1204   AST 16 07/20/2021 1204   ALT 11 07/20/2021 1204   BILITOT 0.6 07/20/2021 1204       Impression and Plan: Mr. 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 Tumor had high PD-L1 score.   Unfortunately, he has progressive disease with the adrenal gland.  However, his main problem seems to be this interstitial process with his lungs.  I do not think would be a bad idea to try to get a high-definition CT scan of the chest.  Maybe, this will show some more detail as to what could be going on.  I suppose he could always have interstitial pneumonitis from malignancy.  Again the only way to prove this is to do a bronchoscopy and biopsy.  I do not  think that this is to be feasible.  I do think that radiation would not be a bad idea for this right adrenal gland.  I will have to speak with Dr. [name] of Radiation Oncology about this.  For right now, we will hold his chemotherapy.  I do not think that he has a  good enough performance status to handle chemotherapy.  I think what we might could use would be gemcitabine and Navelbine.  Again, I am not sure that he is really able to manage this right now.  Hopefully, Pulmonary Medicine will be able to see him and help with his breathing.  Again I am not sure why he cannot use nebulizers.  I would like to get him back here in 3 weeks and see how he is doing.    Volanda Napoleon, MD 6/5/20231:21 PM

## 2021-07-21 ENCOUNTER — Telehealth: Payer: Self-pay | Admitting: *Deleted

## 2021-07-21 LAB — CBC WITH DIFFERENTIAL/PLATELET
Abs Immature Granulocytes: 0.03 10*3/uL (ref 0.00–0.07)
Basophils Absolute: 0 10*3/uL (ref 0.0–0.1)
Basophils Relative: 0 %
Eosinophils Absolute: 0.2 10*3/uL (ref 0.0–0.5)
Eosinophils Relative: 3 %
HCT: 30 % — ABNORMAL LOW (ref 39.0–52.0)
Hemoglobin: 9.4 g/dL — ABNORMAL LOW (ref 13.0–17.0)
Immature Granulocytes: 1 %
Lymphocytes Relative: 13 %
Lymphs Abs: 0.7 10*3/uL (ref 0.7–4.0)
MCH: 26.2 pg (ref 26.0–34.0)
MCHC: 31.3 g/dL (ref 30.0–36.0)
MCV: 83.6 fL (ref 80.0–100.0)
Monocytes Absolute: 0.5 10*3/uL (ref 0.1–1.0)
Monocytes Relative: 10 %
Neutro Abs: 3.7 10*3/uL (ref 1.7–7.7)
Neutrophils Relative %: 73 %
Platelets: 265 10*3/uL (ref 150–400)
RBC: 3.59 MIL/uL — ABNORMAL LOW (ref 4.22–5.81)
RDW: 17.1 % — ABNORMAL HIGH (ref 11.5–15.5)
WBC: 5 10*3/uL (ref 4.0–10.5)
nRBC: 0 % (ref 0.0–0.2)

## 2021-07-21 LAB — SARS CORONAVIRUS 2 BY RT PCR: SARS Coronavirus 2 by RT PCR: NEGATIVE

## 2021-07-21 NOTE — Telephone Encounter (Signed)
[facility] Danielle, left detailed message that patient still needs his POC and he was in the hospital at the time of his scheduled appointment with them and did call to let them know he would not be able to keep the appointment..  I requested that they call him and reschedule him.  Advised to call the office with any questions.

## 2021-07-22 ENCOUNTER — Ambulatory Visit: Payer: 59 | Admitting: Primary Care

## 2021-07-22 ENCOUNTER — Ambulatory Visit: Payer: 59 | Admitting: Nurse Practitioner

## 2021-07-22 ENCOUNTER — Encounter: Payer: Self-pay | Admitting: Primary Care

## 2021-07-22 DIAGNOSIS — J189 Pneumonia, unspecified organism: Secondary | ICD-10-CM

## 2021-07-22 DIAGNOSIS — T50905A Adverse effect of unspecified drugs, medicaments and biological substances, initial encounter: Secondary | ICD-10-CM | POA: Diagnosis not present

## 2021-07-22 DIAGNOSIS — J449 Chronic obstructive pulmonary disease, unspecified: Secondary | ICD-10-CM | POA: Diagnosis not present

## 2021-07-22 DIAGNOSIS — J9611 Chronic respiratory failure with hypoxia: Secondary | ICD-10-CM | POA: Diagnosis not present

## 2021-07-22 MED ORDER — BREZTRI AEROSPHERE 160-9-4.8 MCG/ACT IN AERO: 2.0000 | INHALATION_SPRAY | Freq: Two times a day (BID) | RESPIRATORY_TRACT | 0 refills | Status: DC

## 2021-07-22 NOTE — Progress Notes (Signed)
@Patient  ID: John [name], male    DOB: [birth year], 61 y.o.   MRN: [id number]  Chief Complaint  Patient presents with   Follow-up    Last 3 weeks have been bad. Increased SOB, hypoxic more often at rest. Coughing and wheezing increased.    Referring provider: [name], MD  HPI: 61 year old male, former smoker quit in 2018 (30-pack-year history).  Past medical history significant for A. 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 Patient of Dr. Lamonte [name].   Previous [facility] pulmonary encounter: ROV / Hosp F/u 04/09/21 --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  He also has associated mild obstructive lung disease.  He has been managed on Keytruda but had associated pneumonitis that required a prolonged prednisone taper.  He has chronic hypoxemic respiratory failure and is on 2 L/min.  He was unfortunately admitted 03/30/2021 with acute right pneumothorax that required pigtail catheter placement.  Had a presumed intermittent air leak with better inflation of the right lung with pleural VAC in place even on waterseal.  He went home with an express mini chamber Today he reports that his breathing has been stable. He has over 350cc in his express mini chamber, intermittent air leak on cough  Acute OV 04/14/2021 --[name] returns today after noting significant right upper extremity and right chest swelling.  No real change in his breathing as he does have baseline exertional shortness of breath.  He notes that since I last saw him 2/23 they have had to empty his express mini of approximately 450 cc.  Chest x-ray performed on arrival today shows slight worsening of his right apical pneumothorax and significant subcutaneous air in the pectoral region, right upper extremity.  ROV 06/17/21 --61 year old gentleman who follows with today  after complicated course post admission for acute right pneumothorax.  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  His right-sided chest tube is now out.  Today he reports that he is feeling better. He is now on chemotherapy with Dr John [name]. He is using Anoro qd, can get SOB with exertion. He uses O2 with heavier exertion 2-3L/min. He has tanks, would prefer a POC and has tried to get one, but have not achieved this with DME yet. Minimal to no albuterol use. Planning for repeat CT CAP in a few weeks.    07/22/2021 Hx malignant neoplasm of the lung. Chemotherapy managed by Dr. Marin [name]. Hospitalized in November 2022 for drug induced pneumonitis and February for spontaneous right pneumothorax. He is now off [place].   Patient presents today for acute OV d/t worsening shortness of breath symptoms. Symptoms started three weeks ago around the same time he had chemo therapy. He is currently on Taxotere. He stopped using Anoro as he saw no clinical benefit while using. Currently on prednisone 40mg  twice daily x 5 days. Steroids have helped his breathing. CT abd/pelvis showed interval enlargement of right adrenal mass, consistent with worsened metastatic disease. He has extensive fibrosis to his lungs, substantially evolved over sequential prior examination dating back to 04/29/20, suspect drug toxicity in the setting of chemotherapy. He has an apt with Dr. Sondra [name] on Wednesday. He has an apt for qualifying walk for POC next week with his DME company.   Allergies  Allergen Reactions   Albuterol Other (See  Comments)    Patient has had episode of atrial fib following administration of albuterol (tolerates Xopenex well)   Atorvastatin Other (See Comments)    Cannot take at this time    Immunization History  Administered Date(s) Administered   PFIZER Comirnaty([place])Covid-19 Tri-Sucrose Vaccine 05/10/2019,  06/04/2019, 12/31/2019    Past Medical History:  Diagnosis Date   Atrial fibrillation with RVR ([place]) 05/12/2020   Dyspnea    Family history of adverse reaction to anesthesia    brother with seizures had episode under anesthesia.  Patient has seizures as well.   GERD (gastroesophageal reflux disease)    History of radiation therapy 04/24/2020-05/16/2020   IMRT to right lung     Dr [name]   PAF (paroxysmal atrial fibrillation) ([place])    CHADS2VSAC score 0   Rheumatoid aortitis    Sepsis ([place]) 10/06/2020   Squamous cell lung cancer ([place])     Tobacco History: Social History   Tobacco Use  Smoking Status Former   Packs/day: 1.00   Years: 30.00   Total pack years: 30.00   Types: Cigarettes   Quit date: 2018   Years since quitting: 5.4  Smokeless Tobacco Never  Tobacco Comments   Former smoker 10/28/2020   Counseling given: Not Answered Tobacco comments: Former smoker 10/28/2020   Outpatient Medications Prior to Visit  Medication Sig Dispense Refill   acetaminophen (TYLENOL) 500 MG tablet Take 1,000 mg by mouth every 6 (six) hours as needed for mild pain.     ANORO ELLIPTA 62.5-25 MCG/ACT AEPB Inhale 1 puff into the lungs daily.     apixaban (ELIQUIS) 5 MG TABS tablet Take 1 tablet (5 mg total) by mouth 2 (two) times daily. 60 tablet 6   ezetimibe (ZETIA) 10 MG tablet Take 1 tablet (10 mg total) by mouth daily. (Patient not taking: Reported on 06/29/2021) 90 tablet 3   metoprolol succinate (TOPROL XL) 25 MG 24 hr tablet Take one tablet by mouth daily. Take additional dose (25 mg) for increase HR over 100. 90 tablet 3   PARoxetine (PAXIL) 10 MG tablet Take 10 mg by mouth at bedtime.     predniSONE (DELTASONE) 20 MG tablet Take 2 tablets (40 mg total) by mouth 2 (two) times daily with a meal for 5 days. 20 tablet 0   PRILOSEC OTC 20 MG tablet Take 20 mg by mouth at bedtime.     No facility-administered medications prior to visit.    Review of Systems  Review of Systems   Constitutional: Negative.  Negative for unexpected weight change.  HENT: Negative.    Respiratory:  Positive for shortness of breath. Negative for cough, chest tightness and wheezing.   Cardiovascular: Negative.  Negative for leg swelling.     Physical Exam  BP (!) 94/58 (BP Location: Left Arm, Patient Position: Sitting) Comment: 106/66 retake  Pulse 79   Temp 97.8 F (36.6 C) (Oral)   Ht 5\' 11"  (1.803 m)   Wt 226 lb 12.8 oz (102.9 kg)   SpO2 95% Comment: 4L  BMI 31.63 kg/m  Physical Exam Constitutional:      Appearance: Normal appearance. He is not ill-appearing.  HENT:     Head: Normocephalic and atraumatic.     Mouth/Throat:     Mouth: Mucous membranes are moist.     Pharynx: Oropharynx is clear.  Cardiovascular:     Rate and Rhythm: Normal rate and regular rhythm.     Comments: No edema Pulmonary:  Effort: Pulmonary effort is normal.     Breath sounds: Normal breath sounds.     Comments: Dyspneic while speaking; Faint rales at bil bases L>R Skin:    General: Skin is warm and dry.  Neurological:     General: No focal deficit present.     Mental Status: He is alert and oriented to person, place, and time. Mental status is at baseline.  Psychiatric:        Mood and Affect: Mood normal.        Behavior: Behavior normal.        Thought Content: Thought content normal.        Judgment: Judgment normal.      Lab Results:  CBC    Component Value Date/Time   WBC 5.0 07/21/2021 0005   RBC 3.59 (L) 07/21/2021 0005   HGB 9.4 (L) 07/21/2021 0005   HGB 10.3 (L) 07/20/2021 1204   HCT 30.0 (L) 07/21/2021 0005   PLT 265 07/21/2021 0005   PLT 438 (H) 07/20/2021 1204   MCV 83.6 07/21/2021 0005   MCH 26.2 07/21/2021 0005   MCHC 31.3 07/21/2021 0005   RDW 17.1 (H) 07/21/2021 0005   LYMPHSABS 0.7 07/21/2021 0005   MONOABS 0.5 07/21/2021 0005   EOSABS 0.2 07/21/2021 0005   BASOSABS 0.0 07/21/2021 0005    BMET    Component Value Date/Time   NA 137 07/20/2021  2132   K 3.6 07/20/2021 2132   CL 102 07/20/2021 2132   CO2 27 07/20/2021 2132   GLUCOSE 98 07/20/2021 2132   BUN 11 07/20/2021 2132   CREATININE 0.92 07/20/2021 2132   CREATININE 0.98 07/20/2021 1204   CALCIUM 8.3 (L) 07/20/2021 2132   GFRNONAA >60 07/20/2021 2132   GFRNONAA >60 07/20/2021 1204    BNP    Component Value Date/Time   BNP 23.1 07/20/2021 2132    ProBNP No results found for: "PROBNP"  Imaging: DG Chest 2 View  Result Date: 07/20/2021 CLINICAL DATA:  Shortness of breath. Progressive shortness of breath for 2 weeks. History of lung cancer. EXAM: CHEST - 2 VIEW COMPARISON:  Chest CT 07/14/2021, most recent radiograph 05/05/2021 FINDINGS: Low lung volumes. Stable heart size and mediastinal contours. Right perihilar airspace opacity corresponding to post treatment changes is stable from recent CT. Underlying emphysema. Ill-defined interstitial opacity which was better characterized on recent CT. No acute confluent consolidation. There is no pleural fluid or pneumothorax. IMPRESSION: 1. Unchanged right perihilar airspace opacity corresponding to post treatment changes on recent CT. 2. Diffuse ill-defined interstitial opacity which was better characterized on recent CT. 3. Emphysema. Electronically Signed   By: Keith Rake M.D.   On: 07/20/2021 21:39   CT CHEST ABDOMEN PELVIS W CONTRAST  Result Date: 07/16/2021 CLINICAL DATA:  Metastatic lung cancer restaging, assess treatment response, status post radiation, ongoing chemotherapy, former smoker * Tracking Code: BO * EXAM: CT CHEST, ABDOMEN, AND PELVIS WITH CONTRAST TECHNIQUE: Multidetector CT imaging of the chest, abdomen and pelvis was performed following the standard protocol during bolus administration of intravenous contrast. RADIATION DOSE REDUCTION: This exam was performed according to the departmental dose-optimization program which includes automated exposure control, adjustment of the mA and/or kV according to patient  size and/or use of iterative reconstruction technique. CONTRAST:  150mL ISOVUE-300 IOPAMIDOL (ISOVUE-300) INJECTION 61%, additional oral enteric contrast COMPARISON:  CT chest, 04/21/2021, CT chest abdomen pelvis, 03/30/2021 FINDINGS: CT CHEST FINDINGS Cardiovascular: Aortic atherosclerosis. Normal heart size. Left coronary artery calcifications. No pericardial effusion. Mediastinum/Nodes:  Unchanged prominent subcentimeter mediastinal lymph nodes, particularly a right paraesophageal node (series 2, image 16) and AP window nodes (series 2, image 21). No overtly enlarged mediastinal, hilar, or axillary lymph nodes. Thyroid gland, trachea, and esophagus demonstrate no significant findings. Lungs/Pleura: Unchanged post treatment/post radiation appearance of the right lung, with dense perihilar and infrahilar fibrosis and consolidation of the right lung and a fairly thin walled cavitary lesion which appears to subtending the inferior right upper lobe and adjacent superior segment right lower lobe (series 5, image 60). Diffuse, extensive bilateral ground-glass and mosaic attenuation of the airspaces with probable mild underlying pulmonary fibrosis in an apical to basal gradient, featuring irregular peripheral interstitial opacity, septal thickening, and subtle traction bronchiectasis at the lung bases without clear evidence of subpleural bronchiolectasis or honeycombing. Moderate centrilobular and paraseptal emphysema. No pleural effusion or pneumothorax. Musculoskeletal: No chest wall mass or suspicious osseous lesions identified. CT ABDOMEN PELVIS FINDINGS Hepatobiliary: No solid liver abnormality is seen. Rim calcified gallstone near the gallbladder neck (series 2, image 56). No gallbladder wall thickening, or biliary dilatation. Pancreas: Unremarkable. No pancreatic ductal dilatation or surrounding inflammatory changes. Spleen: Normal in size. Unchanged small infarct of the lateral spleen (series 2, image 61).  Adrenals/Urinary Tract: Interval enlargement of a right adrenal mass measuring 5.8 x 4.3 cm, previously 4.9 x 3.2 cm (series 2, image 57). Kidneys are normal, without renal calculi, solid lesion, or hydronephrosis. Bladder is unremarkable. Stomach/Bowel: Stomach is within normal limits. Appendix appears normal. No evidence of bowel wall thickening, distention, or inflammatory changes. Vascular/Lymphatic: Aortic atherosclerosis. 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 No enlarged abdominal or pelvic lymph nodes. Reproductive: No mass or other abnormality. Other: Small, fat containing left inguinal hernia.  No ascites. Musculoskeletal: New, although age indeterminate superior endplate deformities of L4 and L5 without significant anterior height loss (series 4, image 113). Unchanged superior endplate Schmorl deformity of L2. IMPRESSION: 1. Interval enlargement of a right adrenal mass, consistent with worsened metastatic disease. 2. Unchanged post treatment/post radiation appearance of the right lung, with dense perihilar and infrahilar fibrosis and consolidation of the right lung and cavitary lesion of the perihilar right lung. 3. Unchanged prominent subcentimeter mediastinal lymph nodes. 4. No evidence of new metastatic disease in the chest, abdomen, or pelvis. 5. Diffuse, extensive bilateral ground-glass and mosaic attenuation of the airspaces with probable mild underlying pulmonary fibrosis in an apical to basal gradient, featuring irregular peripheral interstitial opacity, septal thickening, and subtle traction bronchiectasis at the lung bases without clear evidence of subpleural bronchiolectasis or honeycombing. This appearance has substantially evolved over sequential prior examinations dating back to 04/29/2020; suspect drug toxicity in the setting of chemotherapy. 6. Coronary artery disease. 7. 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 Recommend follow-up ultrasound every 3 years if not otherwise imaged and if clinically appropriate. This recommendation follows ACR consensus guidelines: White Paper of the ACR Incidental Findings Committee II on Vascular Findings. J Am Coll Radiol 2013; 10:789-794. Aortic Atherosclerosis (ICD10-I70.0). Electronically Signed   By: Delanna Ahmadi M.D.   On: 07/16/2021 08:43     Assessment & Plan:   Drug-induced pneumonitis - Originally hospitalized in November 2022 due to drug-induced pneumonitis felt to be secondary to [facility]. Beryle Flock has been discontinued.  He is currently on chemotherapy with taxotere managed by Dr. Marin [name].  He reports worsening shortness of breath symptoms starting 3 weeks ago around the  same time that he had chemotherapy.  CT chest/abdomen/pelvis showed extensive fibrosis throughout his lungs, substantially evolved since prior examination dating back to March 2022, suspect drug toxicity in the setting of chemotherapy.  Discussed CT imaging results with Dr. Lamonte [name] and we will treat with prolonged prednisone taper he is currently on 40 mg twice daily x5 days.  Once he is completed this he will take 40 mg daily x3 weeks and taper 10 mg every 5 days until baseline dose of 5 mg daily for his history of rheumatoid arthritis.  Recommending pushing out HRCT 4 weeks to assess response to treatment.  Recommend patient stay off chemotherapy until follow-up.  Sending in Rx for Tessalon Perles 200 mg every 8 hours as needed for cough.  Would consider stronger cough suppressant in the future if needed however he is still working and will need to be used with caution.  COPD with asthma ([place]) - Patient is currently not on any maintenance bronchodilators.  Recommend patient start triple therapy inhaler regimen with Breztri 2 puffs every 12 hours  Chronic respiratory failure with hypoxia ([facility]) - Maintained on supplemental oxygen 2 to 4 L to maintain O2 greater than 88  to 90%.  He is scheduled for qualifying walk for POC next week with [employer].  40 mins spent on case > 50% face to face   Martyn Ehrich, NP 07/23/2021

## 2021-07-22 NOTE — Patient Instructions (Addendum)
Recommendations: - Start Breztri - take two puffs every morning and evening (giving you a 2 week sample and sending in RX) - Continue Prednisone 40mg  twice a day x 5 days; then taper to 40mg  daily x 3 weeks; then we will decrease by 10mg  every 5-7 days to a goal of baseline 5mg  daily  - Stay off chemotherapy until follow-up in 4-6 weeks - Keep apt for qualifying walk for POC  Orders: - Please re-schedule HRCT for early July   Follow-up: - 4-6 weeks with Dr. [name] (July 14th if available)

## 2021-07-23 ENCOUNTER — Telehealth: Payer: Self-pay | Admitting: Primary Care

## 2021-07-23 MED ORDER — PREDNISONE 10 MG PO TABS: ORAL_TABLET | ORAL | 0 refills | Status: AC

## 2021-07-23 MED ORDER — BENZONATATE 200 MG PO CAPS: 200.0000 mg | ORAL_CAPSULE | Freq: Three times a day (TID) | ORAL | 1 refills | Status: DC | PRN

## 2021-07-23 NOTE — Telephone Encounter (Signed)
If Dr [name] wants the scan now, that is fine. I will have to repeat it after he has been treated for presumed pneumonitis.

## 2021-07-23 NOTE — Assessment & Plan Note (Addendum)
-   Originally hospitalized in November 2022 due to drug-induced pneumonitis felt to be secondary to [facility]. John [name] has been discontinued.  He is currently on chemotherapy with taxotere managed by [name].  He reports worsening shortness of breath symptoms starting 3 weeks ago around the same time that he had chemotherapy.  CT chest/abdomen/pelvis showed extensive fibrosis throughout his lungs, substantially evolved since prior examination dating back to March 2022, suspect drug toxicity in the setting of chemotherapy.  Discussed CT imaging results with Dr. [name] and we will treat with prolonged prednisone taper he is currently on 40 mg twice daily x5 days.  Once he is completed this he will take 40 mg daily x3 weeks and taper 10 mg every 5 days until baseline dose of 5 mg daily for his history of rheumatoid arthritis.  Recommending pushing out HRCT 4 weeks to assess response to treatment.  Recommend patient stay off chemotherapy until follow-up.  Sending in Rx for Tessalon Perles 200 mg every 8 hours as needed for cough.  Would consider stronger cough suppressant in the future if needed however he is still working and will need to be used with caution.

## 2021-07-23 NOTE — Telephone Encounter (Signed)
Pulmonary office does not recommend getting HRCT done now, we are treating him for suspected pneumonitis with prednisone and want CT chest in 4 weeks after treatment. This came directly from Dr. [name]. See my note from yesterday.   Cc: Dr. [name] and [name]

## 2021-07-23 NOTE — Assessment & Plan Note (Signed)
-   Maintained on supplemental oxygen 2 to 4 L to maintain O2 greater than 88 to 90%.  He is scheduled for qualifying walk for POC next week with [employer].

## 2021-07-23 NOTE — Telephone Encounter (Signed)
Per Dr. Antonieta Pert office they are insistent that this be done now. He does not want to hold 4 weeks for HRCT.

## 2021-07-23 NOTE — Telephone Encounter (Signed)
Can we please push out patient's HRCT 4 weeks to early July (needs to be done before his apt with Dr. [name] on July 14th). Dr. Antonieta Pert office place this original order.  Will we need to cancel and reorder?

## 2021-07-23 NOTE — Telephone Encounter (Signed)
The only CT order that I see is placed under [name] and his office has already gotten the Auth for it

## 2021-07-23 NOTE — Assessment & Plan Note (Signed)
-   Patient is currently not on any maintenance bronchodilators.  Recommend patient start triple therapy inhaler regimen with Breztri 2 puffs every 12 hours

## 2021-07-24 NOTE — Progress Notes (Signed)
Histology and Location of Primary Cancer: Metastatic squamous cell carcinoma of the right upper lung  Location(s) of Symptomatic tumor(s): right adrenal mass  Past/Anticipated chemotherapy by medical oncology, if any: Chemotherapy on hold for now.  Patient's main complaints related to symptomatic tumor(s) are: Pain in right side of his lower back. Uses heat and Tylenol PRN.  Pain on a scale of 0-10 is: 7 in right lower back.  SAFETY ISSUES: Prior radiation? Yes - 04/24/2020-05/16/2020 right lung 28 Gy Pacemaker/ICD? no Possible current pregnancy? no Is the patient on methotrexate? no  Additional Complaints / other details:  Currently using 3-4 L of oxygen - goes up to 6 L with activity.  BP 131/81 (BP Location: Left Arm, Patient Position: Sitting)   Pulse 73   Temp (!) 96.3 F (35.7 C) (Temporal)   Resp 18   Ht 5\' 11"  (1.803 m)   Wt 231 lb 4 oz (104.9 kg)   SpO2 100%   BMI 32.25 kg/m

## 2021-07-27 ENCOUNTER — Ambulatory Visit (INDEPENDENT_AMBULATORY_CARE_PROVIDER_SITE_OTHER): Payer: 59 | Admitting: Pharmacist

## 2021-07-27 DIAGNOSIS — E66811 Obesity, class 1: Secondary | ICD-10-CM | POA: Insufficient documentation

## 2021-07-27 DIAGNOSIS — E669 Obesity, unspecified: Secondary | ICD-10-CM | POA: Insufficient documentation

## 2021-07-27 DIAGNOSIS — E785 Hyperlipidemia, unspecified: Secondary | ICD-10-CM | POA: Diagnosis not present

## 2021-07-27 NOTE — Patient Instructions (Signed)
LDL cholesterol is currently LDL 182  (goal is <70) Follow up with labs on 8/21 any time after 7:30 - can ask for lipid and hepatic panel at next pulmonology appointment in August  Plan to start Repatha 140 mg injection every 2 weeks, pending insurance approval

## 2021-07-27 NOTE — Progress Notes (Signed)
Patient ID: John [name]                 DOB: 1960-05-03                    MRN: [id number]     HPI: John [name] is a 61 y.o. male patient referred to lipid clinic by Dr. Radford Pax. 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 Last ECHO in 2022 showed EF 60-65%. Pt was last seen by Christen Bame on 06/23/21.  Previously took atorvastatin 10 mg daily, which was stopped after pt felt that it increased his muscle pain during chemotherapy. Pt was admitted on 07/20/21 for interstitial lung disease felt to be secondary to [facility], which has since been discontinued.    Pt presents today for lipid management. Atorvastatin was stopped due to myalgias. Pt was prescribed Zetia afterwards, which was never started due to fear of myalgias in combination with preexisting chemo-related myalgias. Pt denies significant family history of MI. Pt expresses an interest in improving cholesterol to goal. Pt denies any current exercise due to his interstitial lung fibrosis requiring oxygen. Pt reports a fair diet. Pt had questions concerning anti-lipid therapies and cancer, which were answered.   Current Medications:  Intolerances: Atorvastatin 10 mg daily (myalgia) Risk Factors: ASCVD 10 year risk score 14.7%, coronary calcifications, RA LDL goal: <70 mg/dL  Diet: Pt reports eating a well balanced diet  Exercise: Unable to exercise due to oxygen requirement  Family History: The patient's family history includes Arrhythmia in his mother; Heart disease in his father.  Social History: reports that he quit smoking about 5 years ago. His smoking use included cigarettes. He has a 30.00 pack-year smoking history. He has never used smokeless tobacco. He reports that he does not currently use alcohol. He reports that he does not use drugs.   Labs: 06/29/21 Lipid  Panel: TC 237, TG 121, HDL 31, VLDL 24, LDL 182  Past Medical History:  Diagnosis Date   Atrial fibrillation with RVR ([name]) 05/12/2020   Dyspnea    Family history of adverse reaction to anesthesia    brother with seizures had episode under anesthesia.  Patient has seizures as well.   GERD (gastroesophageal reflux disease)    History of radiation therapy 04/24/2020-05/16/2020   IMRT to right lung     Dr [name]   PAF (paroxysmal atrial fibrillation) ([place])    CHADS2VSAC score 0   Rheumatoid aortitis    Sepsis ([place]) 10/06/2020   Squamous cell lung cancer ([place])     Current Outpatient Medications on File Prior to Visit  Medication Sig Dispense Refill   acetaminophen (TYLENOL) 500 MG tablet Take 1,000 mg by mouth every 6 (six) hours as needed for mild pain.     ANORO ELLIPTA 62.5-25 MCG/ACT AEPB Inhale 1 puff into the lungs daily.     apixaban (ELIQUIS) 5 MG TABS tablet Take 1 tablet (5 mg total) by mouth 2 (two) times daily. 60 tablet 6   benzonatate (TESSALON) 200 MG capsule Take 1 capsule (200 mg total) by mouth 3 (three) times daily as needed for cough. 60 capsule 1   Budeson-Glycopyrrol-Formoterol (BREZTRI AEROSPHERE) 160-9-4.8 MCG/ACT AERO Inhale 2 puffs into the lungs in the morning and at bedtime. 1 each 0   ezetimibe (ZETIA) 10 MG tablet Take 1 tablet (10 mg total) by mouth daily. (  Patient not taking: Reported on 06/29/2021) 90 tablet 3   metoprolol succinate (TOPROL XL) 25 MG 24 hr tablet Take one tablet by mouth daily. Take additional dose (25 mg) for increase HR over 100. 90 tablet 3   PARoxetine (PAXIL) 10 MG tablet Take 10 mg by mouth at bedtime.     predniSONE (DELTASONE) 10 MG tablet Take 4 tablets (40 mg total) by mouth daily with breakfast for 21 days, THEN 3 tablets (30 mg total) daily with breakfast for 5 days, THEN 2 tablets (20 mg total) daily with breakfast for 5 days, THEN 1 tablet (10 mg total) daily with breakfast for 5 days. 114 tablet 0   PRILOSEC OTC 20 MG tablet  Take 20 mg by mouth at bedtime.     [DISCONTINUED] prochlorperazine (COMPAZINE) 10 MG tablet Take 1 tablet (10 mg total) by mouth every 6 (six) hours as needed (Nausea or vomiting). (Patient not taking: Reported on 06/29/2021) 30 tablet 1   No current facility-administered medications on file prior to visit.    Allergies  Allergen Reactions   Albuterol Other (See Comments)    Patient has had episode of atrial fib following administration of albuterol (tolerates Xopenex well)   Atorvastatin Other (See Comments)    Cannot take at this time    Assessment/Plan:  1. Hyperlipidemia - LDL of 182 mg/dL is significantly elevated above goal of <70 given coronary artery disease. Will submit PA for Repatha 140 mg SQ every 2 weeks. Patient was educated on Repatha administration, storage, and ADEs. Will contact patient via telephone after PA approval. Will obtain follow up lipid panel and hepatic panel on 10/05/21 or at follow up with Dr. [name] in August.   Patient seen with Reatha Harps, PharmD   Patient seen by PGY1 resident and reviewed with me. Agree with plan.  Karren Cobble, PharmD, BCACP, [place], [place] [address], [place], [place] [address] Phone: [phone]; Fax: [phone] 07/28/2021 10:49 AM

## 2021-07-28 ENCOUNTER — Encounter: Payer: Self-pay | Admitting: Pharmacist

## 2021-07-28 NOTE — Progress Notes (Signed)
Radiation Oncology         ([phone]) [phone] ________________________________  Outpatient Re-Consultation  Name: John [name] MRN: [id number]  Date: 07/29/2021  DOB: 05-06-1960  NW:[id number], Jeneen Rinks, MD  Volanda Napoleon, MD   REFERRING PHYSICIAN: Volanda Napoleon, MD  DIAGNOSIS: There were no encounter diagnoses.  The encounter diagnosis was Stage 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 ([place]).  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 male who is accompanied by ***. he returns today courtesy of Dr.Ennever for an opinion concerning radiation therapy as part of management for his recent disease progression to the adrenal gland. I last met with the patient in May of 2022. During which time, we agreed to proceed with repeat imaging given that he presented with no obvious indications for additional palliative radiation therapy. The patient also began immunotherapy consisting of Keytruda around this time under the care of Dr. [name]. To review, the patient underwent 4 cycles of chemotherapy (first dose in March of 2022) which was discontinued due to significant chemo toxicities. Following his 4th cycle, the patient continued on with Keytruda alone Beryle Flock was initiated during cycle 2 of systemic treatment).   On 09/22/20, the patient established care with [name] (pt requested to be treated at [facility]). Chest CT performed on 07/18/20 reviewed at this time showed a positive response to treatment, demonstrated by a decrease in size of the right upper lobe mass, and no new or progressive metastatic disease. The patient was also noted to be tolerating immunotherapy well at the time, and denied any respiratory symptoms.   10/05/20 through 10/14/20 admission: the patient was admitted with  neutropenic fever and sepsis meeting SIRS criteria. Chest CT performed while inpatient showed increasingly conspicuous lymph nodes in the right hilar, mediastinal, lower right cervical, and supraclavicular nodal stations potentially concerning for metastatic disease vs  reactive processes. A satellite nodule measuring 11 mm in the right upper lobe was also appreciated which appeared stable when compared to a prior study. CT also showed right adrenal metastasis , splenomegaly with evidence of old splenic infarcts, and cholelithiasis. Blood culture came back positive for pseudomonas-sepsis with an unclear source. Oncology was consulted and the patient was given IV antibiotics (cipro and vancomycin). The patient was also found to be in a-fib (uncontrolled) and subsequently underwent TEE/DCCV on 08/29.   12/22/20 through 01/01/21 admission: patient was admitted with pneumonitis secondary to [place] treatments . CTA of the chest performed showed findings consistent with progressive pneumonitis without evidence of PE. Hospital course included  IV Solu-Medrol, vancomycin, cefepime, azithromycin, and Xopenex nebs.  Hospitalist service was also consulted for further evaluation and management of acute hypoxic respiratory failure secondary to community acquired pneumonia versus drug-induced pneumonitis. Chest CT performed on 12/31/20 showed a marked normal mediastinum extending to the neck base soft tissues and right chest wall. . Overall findings were noted to be consistent with barotrauma.  CT also showed improved lung opacities and findings consistent with fibrosis affecting right mid to upper lung, and the large right adrenal mass measuring 5.9 cm. His Pembrolizumab was accordingly discontinued by [name].   Given his reaction to [facility], [name] started the patient on taxotere on 01/29/21.   The patient was admitted on 3 separate occasions in February and March 2023 for recurrent pneumothorax.  During his  first admission from 03/30/2021 through 04/03/2021, the patient presented with spontaneous right pneumothorax. He had a chest tube placed which was subsequently removed on 02/15. However, on 04/02/21 the patient developed recurrent pneumothorax about 70%. Chest tube was reinserted on 02/16 and transitioned to portable chest tube chamber.  He followed up with pulmonology on 04/09/21 and had about 350 cc gold colored fluid removed from pleural -evac chamber. For his second admission on 04/14/21, the patient presented with right upper extremity swelling and was found to have a worsening right pneumothorax on CXR. He accordingly had a pleur-evac placed with improvement in pneumothorax appreciated on repeat chest x-ray. For his third admission on 04/17/21, management was conservative per pulmonology w/ NRB for nitrogen washout initially, and consistent supplemental oxygen use. Chest CT performed while inpatient on 04/21/21 showed: trace residual right-sided pneumothorax anteriorly; a trace right pleural effusion; and stable chronic pulmonary parenchymal changes of radiation treatment and fibrosis with cystic change in the right mid to upper lung.  CT of the chest abdomen and pelvis on 07/14/21 showed interval enlargement of a right adrenal mass, measuring 5.8 x 4.3 cm, consistent with worsened metastatic disease. CT also showed: unchanged post treatment/post radiation appearance of the right lung; dense perihilar and infrahilar fibrosis and consolidation of the right lung and cavitary lesion of the perihilar right lung; unchanged prominent subcentimeter mediastinal lymph nodes. Otherwise, CT showed no evidence of new metastatic disease in the chest, abdomen, or pelvis.  Subsequently, [name] discontinued taxotere on 07/20/21. During a follow up visit on that date, the patient endorsed significant desaturation without supplemental O2 use (requires 3L at baseline) over the course of 3 weeks and progressive SOB. In  addition to his progressive disease to the adrenal gland, [name] notes his main issue to be interstitial lung changes and fibrosis. [name] has held his chemotherapy, and does not think that the patient can tolerate alternate chemotherapy at this time.   The patient has been followed by [facility]monology as well. During his most recent visit with Volanda Napoleon NP on 07/22/21, the patient was put on a prolonged prednisone taper and prescribed Tessalon Perles and Breztri.   PREVIOUS RADIATION THERAPY: Yes,   Patient completed radiation therapy treatment on 06/04/20. Dose and rate information were as follows:  Lung_Rt : 24.00 of 24.00Gy, 12 of 12 fractions delivered Lung_Rt:1 : 4.00 of 4.00Gy, 2 of 2 fractions delivered  PAST MEDICAL HISTORY:  Past Medical History:  Diagnosis Date   Atrial fibrillation with RVR ([place]) 05/12/2020   Dyspnea    Family history of adverse reaction to anesthesia    brother with seizures had episode under anesthesia.  Patient has seizures as well.   GERD (gastroesophageal reflux disease)    History of radiation therapy 04/24/2020-05/16/2020   IMRT to right lung     Dr [name]   PAF (paroxysmal atrial fibrillation) ([place])    CHADS2VSAC score 0   Rheumatoid aortitis    Sepsis ([place]) 10/06/2020   Squamous cell lung cancer ([place])     PAST SURGICAL HISTORY: Past Surgical History:  Procedure Laterality Date   BRONCHIAL BRUSHINGS  03/27/2020   Procedure: BRONCHIAL BRUSHINGS;  Surgeon: [name], MD;  Location: [place];  Service: Cardiopulmonary;;   BUBBLE STUDY  10/13/2020   Procedure: BUBBLE STUDY;  Surgeon: Pixie Casino, MD;  Location: [place];  Service: Cardiovascular;;   CARDIOVERSION N/A 10/13/2020   Procedure: CARDIOVERSION;  Surgeon: Pixie Casino, MD;  Location: [facility]  ENDOSCOPY;  Service: Cardiovascular;  Laterality: N/A;   CATARACT EXTRACTION  2016   at Home  03/27/2020   Procedure: FINE NEEDLE ASPIRATION;   Surgeon: [name], MD;  Location: [place];  Service: Cardiopulmonary;;   HIP SURGERY Left    TEE WITHOUT CARDIOVERSION N/A 10/13/2020   Procedure: TRANSESOPHAGEAL ECHOCARDIOGRAM (TEE);  Surgeon: Pixie Casino, MD;  Location: [place];  Service: Cardiovascular;  Laterality: N/A;   VIDEO BRONCHOSCOPY WITH ENDOBRONCHIAL ULTRASOUND N/A 03/27/2020   Procedure: VIDEO BRONCHOSCOPY WITH ENDOBRONCHIAL ULTRASOUND;  Surgeon: [name], MD;  Location: [place];  Service: Cardiopulmonary;  Laterality: N/A;    FAMILY HISTORY:  Family History  Problem Relation Age of Onset   Arrhythmia Mother        has PPM   Heart disease Father        Died at 59, started in his 28s, heart attacks, had PPM and ICD    SOCIAL HISTORY:  Social History   Tobacco Use   Smoking status: Former    Packs/day: 1.00    Years: 30.00    Total pack years: 30.00    Types: Cigarettes    Quit date: 2018    Years since quitting: 5.4   Smokeless tobacco: Never   Tobacco comments:    Former smoker 10/28/2020  Vaping Use   Vaping Use: Never used  Substance Use Topics   Alcohol use: Not Currently   Drug use: Never    ALLERGIES:  Allergies  Allergen Reactions   Albuterol Other (See Comments)    Patient has had episode of atrial fib following administration of albuterol (tolerates Xopenex well)   Atorvastatin Other (See Comments)    Cannot take at this time    MEDICATIONS:  Current Outpatient Medications  Medication Sig Dispense Refill   acetaminophen (TYLENOL) 500 MG tablet Take 1,000 mg by mouth every 6 (six) hours as needed for mild pain.     ANORO ELLIPTA 62.5-25 MCG/ACT AEPB Inhale 1 puff into the lungs daily.     apixaban (ELIQUIS) 5 MG TABS tablet Take 1 tablet (5 mg total) by mouth 2 (two) times daily. 60 tablet 6   benzonatate (TESSALON) 200 MG capsule Take 1 capsule (200 mg total) by mouth 3 (three) times daily as needed for cough. 60 capsule 1   Budeson-Glycopyrrol-Formoterol  (BREZTRI AEROSPHERE) 160-9-4.8 MCG/ACT AERO Inhale 2 puffs into the lungs in the morning and at bedtime. 1 each 0   ezetimibe (ZETIA) 10 MG tablet Take 1 tablet (10 mg total) by mouth daily. (Patient not taking: Reported on 06/29/2021) 90 tablet 3   metoprolol succinate (TOPROL XL) 25 MG 24 hr tablet Take one tablet by mouth daily. Take additional dose (25 mg) for increase HR over 100. 90 tablet 3   PARoxetine (PAXIL) 10 MG tablet Take 10 mg by mouth at bedtime.     predniSONE (DELTASONE) 10 MG tablet Take 4 tablets (40 mg total) by mouth daily with breakfast for 21 days, THEN 3 tablets (30 mg total) daily with breakfast for 5 days, THEN 2 tablets (20 mg total) daily with breakfast for 5 days, THEN 1 tablet (10 mg total) daily with breakfast for 5 days. 114 tablet 0   PRILOSEC OTC 20 MG tablet Take 20 mg by mouth at bedtime.     No current facility-administered medications for this encounter.    REVIEW OF SYSTEMS:  A 10+ POINT REVIEW OF SYSTEMS WAS OBTAINED including neurology, dermatology,  psychiatry, cardiac, respiratory, lymph, extremities, GI, GU, musculoskeletal, constitutional, reproductive, HEENT. ***   PHYSICAL EXAM:  vitals were not taken for this visit.   General: Alert and oriented, in no acute distress HEENT: Head is normocephalic. Extraocular movements are intact. Oropharynx is clear. Neck: Neck is supple, no palpable cervical or supraclavicular lymphadenopathy. Heart: Regular in rate and rhythm with no murmurs, rubs, or gallops. Chest: Clear to auscultation bilaterally, with no rhonchi, wheezes, or rales. Abdomen: Soft, nontender, nondistended, with no rigidity or guarding. Extremities: No cyanosis or edema. Lymphatics: see Neck Exam Skin: No concerning lesions. Musculoskeletal: symmetric strength and muscle tone throughout. Neurologic: Cranial nerves II through XII are grossly intact. No obvious focalities. Speech is fluent. Coordination is intact. Psychiatric: Judgment and  insight are intact. Affect is appropriate. ***  ECOG = ***  0 - Asymptomatic (Fully active, able to carry on all predisease activities without restriction)  1 - Symptomatic but completely ambulatory (Restricted in physically strenuous activity but ambulatory and able to carry out work of a light or sedentary nature. For example, light housework, office work)  2 - Symptomatic, <50% in bed during the day (Ambulatory and capable of all self care but unable to carry out any work activities. Up and about more than 50% of waking hours)  3 - Symptomatic, >50% in bed, but not bedbound (Capable of only limited self-care, confined to bed or chair 50% or more of waking hours)  4 - Bedbound (Completely disabled. Cannot carry on any self-care. Totally confined to bed or chair)  5 - Death   Eustace Pen MM, Creech RH, Tormey DC, et al. [phone]). "Toxicity and response criteria of the [facility]". [place] Oncol. 5 (6): 649-55  LABORATORY DATA:  Lab Results  Component Value Date   WBC 5.0 07/21/2021   HGB 9.4 (L) 07/21/2021   HCT 30.0 (L) 07/21/2021   MCV 83.6 07/21/2021   PLT 265 07/21/2021   NEUTROABS 3.7 07/21/2021   Lab Results  Component Value Date   NA 137 07/20/2021   K 3.6 07/20/2021   CL 102 07/20/2021   CO2 27 07/20/2021   GLUCOSE 98 07/20/2021   BUN 11 07/20/2021   CREATININE 0.92 07/20/2021   CALCIUM 8.3 (L) 07/20/2021      RADIOGRAPHY: DG Chest 2 View  Result Date: 07/20/2021 CLINICAL DATA:  Shortness of breath. Progressive shortness of breath for 2 weeks. History of lung cancer. EXAM: CHEST - 2 VIEW COMPARISON:  Chest CT 07/14/2021, most recent radiograph 05/05/2021 FINDINGS: Low lung volumes. Stable heart size and mediastinal contours. Right perihilar airspace opacity corresponding to post treatment changes is stable from recent CT. Underlying emphysema. Ill-defined interstitial opacity which was better characterized on recent CT. No acute confluent  consolidation. There is no pleural fluid or pneumothorax. IMPRESSION: 1. Unchanged right perihilar airspace opacity corresponding to post treatment changes on recent CT. 2. Diffuse ill-defined interstitial opacity which was better characterized on recent CT. 3. Emphysema. Electronically Signed   By: Keith Rake M.D.   On: 07/20/2021 21:39   CT CHEST ABDOMEN PELVIS W CONTRAST  Result Date: 07/16/2021 CLINICAL DATA:  Metastatic lung cancer restaging, assess treatment response, status post radiation, ongoing chemotherapy, former smoker * Tracking Code: BO * EXAM: CT CHEST, ABDOMEN, AND PELVIS WITH CONTRAST TECHNIQUE: Multidetector CT imaging of the chest, abdomen and pelvis was performed following the standard protocol during bolus administration of intravenous contrast. RADIATION DOSE REDUCTION: This exam was performed according to the departmental dose-optimization program  which includes automated exposure control, adjustment of the mA and/or kV according to patient size and/or use of iterative reconstruction technique. CONTRAST:  130m ISOVUE-300 IOPAMIDOL (ISOVUE-300) INJECTION 61%, additional oral enteric contrast COMPARISON:  CT chest, 04/21/2021, CT chest abdomen pelvis, 03/30/2021 FINDINGS: CT CHEST FINDINGS Cardiovascular: Aortic atherosclerosis. Normal heart size. Left coronary artery calcifications. No pericardial effusion. Mediastinum/Nodes: Unchanged prominent subcentimeter mediastinal lymph nodes, particularly a right paraesophageal node (series 2, image 16) and AP window nodes (series 2, image 21). No overtly enlarged mediastinal, hilar, or axillary lymph nodes. Thyroid gland, trachea, and esophagus demonstrate no significant findings. Lungs/Pleura: Unchanged post treatment/post radiation appearance of the right lung, with dense perihilar and infrahilar fibrosis and consolidation of the right lung and a fairly thin walled cavitary lesion which appears to subtending the inferior right upper lobe  and adjacent superior segment right lower lobe (series 5, image 60). Diffuse, extensive bilateral ground-glass and mosaic attenuation of the airspaces with probable mild underlying pulmonary fibrosis in an apical to basal gradient, featuring irregular peripheral interstitial opacity, septal thickening, and subtle traction bronchiectasis at the lung bases without clear evidence of subpleural bronchiolectasis or honeycombing. Moderate centrilobular and paraseptal emphysema. No pleural effusion or pneumothorax. Musculoskeletal: No chest wall mass or suspicious osseous lesions identified. CT ABDOMEN PELVIS FINDINGS Hepatobiliary: No solid liver abnormality is seen. Rim calcified gallstone near the gallbladder neck (series 2, image 56). No gallbladder wall thickening, or biliary dilatation. Pancreas: Unremarkable. No pancreatic ductal dilatation or surrounding inflammatory changes. Spleen: Normal in size. Unchanged small infarct of the lateral spleen (series 2, image 61). Adrenals/Urinary Tract: Interval enlargement of a right adrenal mass measuring 5.8 x 4.3 cm, previously 4.9 x 3.2 cm (series 2, image 57). Kidneys are normal, without renal calculi, solid lesion, or hydronephrosis. Bladder is unremarkable. Stomach/Bowel: Stomach is within normal limits. Appendix appears normal. No evidence of bowel wall thickening, distention, or inflammatory changes. Vascular/Lymphatic: Aortic atherosclerosis. 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 No enlarged abdominal or pelvic lymph nodes. Reproductive: No mass or other abnormality. Other: Small, fat containing left inguinal hernia.  No ascites. Musculoskeletal: New, although age indeterminate superior endplate deformities of L4 and L5 without significant anterior height loss (series 4, image 113). Unchanged superior endplate Schmorl deformity of L2. IMPRESSION: 1. Interval enlargement of a right adrenal mass,  consistent with worsened metastatic disease. 2. Unchanged post treatment/post radiation appearance of the right lung, with dense perihilar and infrahilar fibrosis and consolidation of the right lung and cavitary lesion of the perihilar right lung. 3. Unchanged prominent subcentimeter mediastinal lymph nodes. 4. No evidence of new metastatic disease in the chest, abdomen, or pelvis. 5. Diffuse, extensive bilateral ground-glass and mosaic attenuation of the airspaces with probable mild underlying pulmonary fibrosis in an apical to basal gradient, featuring irregular peripheral interstitial opacity, septal thickening, and subtle traction bronchiectasis at the lung bases without clear evidence of subpleural bronchiolectasis or honeycombing. This appearance has substantially evolved over sequential prior examinations dating back to 04/29/2020; suspect drug toxicity in the setting of chemotherapy. 6. Coronary artery disease. 7. 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 Recommend follow-up ultrasound every 3 years if not otherwise imaged and if clinically appropriate. This recommendation follows ACR consensus guidelines: White Paper of the ACR Incidental Findings Committee II on Vascular Findings. J Am Coll Radiol 2013; 10:789-794. Aortic Atherosclerosis (ICD10-I70.0). Electronically Signed   By: AJamse MeadD.  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  We discussed the natural history of *** and general treatment, highlighting the role of radiotherapy in the management.  We discussed the available radiation techniques, and focused on the details of logistics and delivery.  We reviewed the anticipated acute and  late sequelae associated with radiation in this setting.  The patient was encouraged to ask questions that I answered to the best of my ability. *** A patient consent form was discussed and signed.  We retained a copy for our records.  The patient would like to proceed with radiation and will be scheduled for CT simulation.  PLAN: ***    *** minutes of total time was spent for this patient encounter, including preparation, face-to-face counseling with the patient and coordination of care, physical exam, and documentation of the encounter.   ------------------------------------------------  Blair Promise, PhD, MD  This document serves as a record of services personally performed by [name], MD. It was created on his behalf by Roney Mans, a trained medical scribe. The creation of this record is based on the scribe's personal observations and the provider's statements to them. This document has been checked and approved by the attending provider.

## 2021-07-29 ENCOUNTER — Telehealth: Payer: Self-pay | Admitting: Pharmacist

## 2021-07-29 ENCOUNTER — Encounter: Payer: Self-pay | Admitting: Radiation Oncology

## 2021-07-29 ENCOUNTER — Ambulatory Visit
Admission: RE | Admit: 2021-07-29 | Discharge: 2021-07-29 | Disposition: A | Discharge status: Home / Self Care | Payer: 59 | Source: Ambulatory Visit | Attending: Radiation Oncology | Admitting: Radiation Oncology

## 2021-07-29 ENCOUNTER — Other Ambulatory Visit: Payer: Self-pay

## 2021-07-29 VITALS — BP 131/81 | HR 73 | Temp 96.3°F | Resp 18 | Ht 71.0 in | Wt 231.2 lb

## 2021-07-29 DIAGNOSIS — I480 Paroxysmal atrial fibrillation: Secondary | ICD-10-CM | POA: Diagnosis not present

## 2021-07-29 DIAGNOSIS — C7971 Secondary malignant neoplasm of right adrenal gland: Secondary | ICD-10-CM | POA: Insufficient documentation

## 2021-07-29 DIAGNOSIS — Z87891 Personal history of nicotine dependence: Secondary | ICD-10-CM | POA: Diagnosis not present

## 2021-07-29 DIAGNOSIS — I4891 Unspecified atrial fibrillation: Secondary | ICD-10-CM | POA: Insufficient documentation

## 2021-07-29 DIAGNOSIS — K4090 Unilateral inguinal hernia, without obstruction or gangrene, not specified as recurrent: Secondary | ICD-10-CM | POA: Insufficient documentation

## 2021-07-29 DIAGNOSIS — Z79899 Other long term (current) drug therapy: Secondary | ICD-10-CM | POA: Diagnosis not present

## 2021-07-29 DIAGNOSIS — I700 Atherosclerosis of aorta: Secondary | ICD-10-CM | POA: Insufficient documentation

## 2021-07-29 DIAGNOSIS — Z923 Personal history of irradiation: Secondary | ICD-10-CM | POA: Diagnosis not present

## 2021-07-29 DIAGNOSIS — M7989 Other specified soft tissue disorders: Secondary | ICD-10-CM | POA: Diagnosis not present

## 2021-07-29 DIAGNOSIS — C3411 Malignant neoplasm of upper lobe, right bronchus or lung: Secondary | ICD-10-CM | POA: Diagnosis not present

## 2021-07-29 DIAGNOSIS — I7143 Infrarenal abdominal aortic aneurysm, without rupture: Secondary | ICD-10-CM | POA: Insufficient documentation

## 2021-07-29 DIAGNOSIS — K219 Gastro-esophageal reflux disease without esophagitis: Secondary | ICD-10-CM | POA: Insufficient documentation

## 2021-07-29 DIAGNOSIS — R161 Splenomegaly, not elsewhere classified: Secondary | ICD-10-CM | POA: Insufficient documentation

## 2021-07-29 DIAGNOSIS — C3491 Malignant neoplasm of unspecified part of right bronchus or lung: Secondary | ICD-10-CM

## 2021-07-29 DIAGNOSIS — I011 Acute rheumatic endocarditis: Secondary | ICD-10-CM | POA: Diagnosis not present

## 2021-07-29 DIAGNOSIS — I2510 Atherosclerotic heart disease of native coronary artery without angina pectoris: Secondary | ICD-10-CM | POA: Diagnosis not present

## 2021-07-29 DIAGNOSIS — Z7901 Long term (current) use of anticoagulants: Secondary | ICD-10-CM | POA: Insufficient documentation

## 2021-07-29 DIAGNOSIS — J432 Centrilobular emphysema: Secondary | ICD-10-CM | POA: Insufficient documentation

## 2021-07-29 NOTE — Telephone Encounter (Signed)
Pt called and informed of [employer] denial. Pt was instructed to start taking the ezetimibe 10 mg daily as prescribed previously. Pt was agreeable and will come for lipid and hepatic labs on 10/05/21.

## 2021-07-30 ENCOUNTER — Other Ambulatory Visit: Payer: Self-pay | Admitting: Emergency Medicine

## 2021-07-30 NOTE — Telephone Encounter (Signed)
Per last OV note Geraldo Pitter changed inhaler to [employer]

## 2021-08-01 ENCOUNTER — Inpatient Hospital Stay (HOSPITAL_COMMUNITY)
Admission: EM | Admit: 2021-08-01 | Discharge: 2021-08-07 | DRG: 309 | Disposition: A | Discharge status: Home / Self Care | Payer: 59 | Attending: Family Medicine | Admitting: Family Medicine

## 2021-08-01 ENCOUNTER — Encounter (HOSPITAL_COMMUNITY): Payer: Self-pay | Admitting: Emergency Medicine

## 2021-08-01 ENCOUNTER — Other Ambulatory Visit: Payer: Self-pay

## 2021-08-01 DIAGNOSIS — J4489 Other specified chronic obstructive pulmonary disease: Secondary | ICD-10-CM | POA: Diagnosis present

## 2021-08-01 DIAGNOSIS — I480 Paroxysmal atrial fibrillation: Secondary | ICD-10-CM | POA: Diagnosis not present

## 2021-08-01 DIAGNOSIS — I4891 Unspecified atrial fibrillation: Principal | ICD-10-CM

## 2021-08-01 DIAGNOSIS — J984 Other disorders of lung: Secondary | ICD-10-CM | POA: Diagnosis present

## 2021-08-01 DIAGNOSIS — M069 Rheumatoid arthritis, unspecified: Secondary | ICD-10-CM | POA: Diagnosis present

## 2021-08-01 DIAGNOSIS — I2510 Atherosclerotic heart disease of native coronary artery without angina pectoris: Secondary | ICD-10-CM | POA: Diagnosis present

## 2021-08-01 DIAGNOSIS — M549 Dorsalgia, unspecified: Secondary | ICD-10-CM | POA: Diagnosis present

## 2021-08-01 DIAGNOSIS — R002 Palpitations: Secondary | ICD-10-CM | POA: Diagnosis not present

## 2021-08-01 DIAGNOSIS — Z923 Personal history of irradiation: Secondary | ICD-10-CM

## 2021-08-01 DIAGNOSIS — Z8739 Personal history of other diseases of the musculoskeletal system and connective tissue: Secondary | ICD-10-CM

## 2021-08-01 DIAGNOSIS — Z6832 Body mass index (BMI) 32.0-32.9, adult: Secondary | ICD-10-CM

## 2021-08-01 DIAGNOSIS — J449 Chronic obstructive pulmonary disease, unspecified: Secondary | ICD-10-CM | POA: Diagnosis present

## 2021-08-01 DIAGNOSIS — Z7901 Long term (current) use of anticoagulants: Secondary | ICD-10-CM

## 2021-08-01 DIAGNOSIS — E669 Obesity, unspecified: Secondary | ICD-10-CM | POA: Diagnosis present

## 2021-08-01 DIAGNOSIS — D630 Anemia in neoplastic disease: Secondary | ICD-10-CM | POA: Diagnosis present

## 2021-08-01 DIAGNOSIS — Z7952 Long term (current) use of systemic steroids: Secondary | ICD-10-CM

## 2021-08-01 DIAGNOSIS — E66811 Obesity, class 1: Secondary | ICD-10-CM | POA: Diagnosis present

## 2021-08-01 DIAGNOSIS — I700 Atherosclerosis of aorta: Secondary | ICD-10-CM | POA: Diagnosis present

## 2021-08-01 DIAGNOSIS — M7989 Other specified soft tissue disorders: Secondary | ICD-10-CM | POA: Diagnosis present

## 2021-08-01 DIAGNOSIS — E222 Syndrome of inappropriate secretion of antidiuretic hormone: Secondary | ICD-10-CM | POA: Diagnosis present

## 2021-08-01 DIAGNOSIS — C7971 Secondary malignant neoplasm of right adrenal gland: Secondary | ICD-10-CM | POA: Diagnosis present

## 2021-08-01 DIAGNOSIS — Z8249 Family history of ischemic heart disease and other diseases of the circulatory system: Secondary | ICD-10-CM

## 2021-08-01 DIAGNOSIS — J704 Drug-induced interstitial lung disorders, unspecified: Secondary | ICD-10-CM | POA: Diagnosis present

## 2021-08-01 DIAGNOSIS — Z87891 Personal history of nicotine dependence: Secondary | ICD-10-CM

## 2021-08-01 DIAGNOSIS — J9611 Chronic respiratory failure with hypoxia: Secondary | ICD-10-CM | POA: Diagnosis present

## 2021-08-01 DIAGNOSIS — Z888 Allergy status to other drugs, medicaments and biological substances status: Secondary | ICD-10-CM

## 2021-08-01 DIAGNOSIS — E876 Hypokalemia: Secondary | ICD-10-CM | POA: Diagnosis present

## 2021-08-01 DIAGNOSIS — E782 Mixed hyperlipidemia: Secondary | ICD-10-CM | POA: Diagnosis present

## 2021-08-01 DIAGNOSIS — K219 Gastro-esophageal reflux disease without esophagitis: Secondary | ICD-10-CM | POA: Diagnosis present

## 2021-08-01 DIAGNOSIS — Z9221 Personal history of antineoplastic chemotherapy: Secondary | ICD-10-CM

## 2021-08-01 DIAGNOSIS — T451X5A Adverse effect of antineoplastic and immunosuppressive drugs, initial encounter: Secondary | ICD-10-CM | POA: Diagnosis present

## 2021-08-01 DIAGNOSIS — Z79899 Other long term (current) drug therapy: Secondary | ICD-10-CM

## 2021-08-01 DIAGNOSIS — C3491 Malignant neoplasm of unspecified part of right bronchus or lung: Secondary | ICD-10-CM | POA: Diagnosis present

## 2021-08-01 DIAGNOSIS — J189 Pneumonia, unspecified organism: Secondary | ICD-10-CM | POA: Diagnosis present

## 2021-08-01 DIAGNOSIS — Z8673 Personal history of transient ischemic attack (TIA), and cerebral infarction without residual deficits: Secondary | ICD-10-CM

## 2021-08-01 DIAGNOSIS — C3401 Malignant neoplasm of right main bronchus: Secondary | ICD-10-CM | POA: Diagnosis present

## 2021-08-01 DIAGNOSIS — I482 Chronic atrial fibrillation, unspecified: Secondary | ICD-10-CM | POA: Diagnosis present

## 2021-08-01 LAB — CBC
HCT: 38.1 % — ABNORMAL LOW (ref 39.0–52.0)
Hemoglobin: 12.1 g/dL — ABNORMAL LOW (ref 13.0–17.0)
MCH: 27.6 pg (ref 26.0–34.0)
MCHC: 31.8 g/dL (ref 30.0–36.0)
MCV: 87 fL (ref 80.0–100.0)
Platelets: 197 10*3/uL (ref 150–400)
RBC: 4.38 MIL/uL (ref 4.22–5.81)
RDW: 19.4 % — ABNORMAL HIGH (ref 11.5–15.5)
WBC: 17.5 10*3/uL — ABNORMAL HIGH (ref 4.0–10.5)
nRBC: 0 % (ref 0.0–0.2)

## 2021-08-01 MED ORDER — DILTIAZEM HCL-DEXTROSE 125-5 MG/125ML-% IV SOLN (PREMIX)
5.0000 mg/h | INTRAVENOUS | Status: DC
  Administered 2021-08-02: 5 mg/h via INTRAVENOUS
  Filled 2021-08-01: qty 125

## 2021-08-01 MED ORDER — DILTIAZEM HCL 25 MG/5ML IV SOLN
10.0000 mg | Freq: Once | INTRAVENOUS | Status: AC
  Administered 2021-08-01: 10 mg via INTRAVENOUS
  Filled 2021-08-01: qty 5

## 2021-08-01 NOTE — ED Notes (Signed)
Family at bedside. 

## 2021-08-01 NOTE — ED Provider Notes (Signed)
[name] DEPT Provider Note   CSN: [id number] Arrival date & time: 08/01/21  2158     History {Add pertinent medical, surgical, social history, OB history to HPI:1} Chief Complaint  Patient presents with   Palpitations    John [name] is a 61 y.o. male who presents to the emergency department with chief complaint racing heart.  Patient has a past medical history of lung cancer, he was recently diagnosed with pneumonitis secondary to his Beryle Flock which has been discontinued.  He has been on a high dose of steroids and is tapering down which is affected his appetite, sleep and has improved his breathing.  Patient states that yesterday he had elevated blood pressure but was also feeling somewhat dizzy and had his heart racing.  He took an extra dose of his metoprolol which helped his symptoms.  Throughout the day he had worsening racing in his heart which brought him into the emergency department.  He is on Eliquis.  He states that he frequently goes into A-fib when he has been on steroids for some time.  He is requiring oxygen supplementation at this time which he states happens when he has a flareup of breathing difficulties.  Currently requiring 4 L at rest.  He denies chest pain, fevers, chills.   Palpitations      Home Medications Prior to Admission medications   Medication Sig Start Date End Date Taking? Authorizing Provider  acetaminophen (TYLENOL) 500 MG tablet Take 1,000 mg by mouth every 6 (six) hours as needed for mild pain.    [provider]  Celedonio Miyamoto 62.5-25 MCG/ACT AEPB Inhale 1 puff into the lungs daily. Patient not taking: Reported on 07/29/2021 06/18/21   [provider]  apixaban (ELIQUIS) 5 MG TABS tablet Take 1 tablet (5 mg total) by mouth 2 (two) times daily. 01/05/21   Fenton, Clint R, PA  benzonatate (TESSALON) 200 MG capsule Take 1 capsule (200 mg total) by mouth 3 (three) times daily as needed for  cough. Patient not taking: Reported on 07/29/2021 07/23/21   Martyn Ehrich, NP  Budeson-Glycopyrrol-Formoterol (BREZTRI AEROSPHERE) 160-9-4.8 MCG/ACT AERO Inhale 2 puffs into the lungs in the morning and at bedtime. 07/22/21   Martyn Ehrich, NP  ezetimibe (ZETIA) 10 MG tablet Take 1 tablet (10 mg total) by mouth daily. Patient not taking: Reported on 06/29/2021 06/23/21   Swinyer, Lanice Schwab, NP  metoprolol succinate (TOPROL XL) 25 MG 24 hr tablet Take one tablet by mouth daily. Take additional dose (25 mg) for increase HR over 100. 06/23/21   Swinyer, Lanice Schwab, NP  PARoxetine (PAXIL) 10 MG tablet Take 10 mg by mouth at bedtime. 01/25/20   [provider]  predniSONE (DELTASONE) 10 MG tablet Take 4 tablets (40 mg total) by mouth daily with breakfast for 21 days, THEN 3 tablets (30 mg total) daily with breakfast for 5 days, THEN 2 tablets (20 mg total) daily with breakfast for 5 days, THEN 1 tablet (10 mg total) daily with breakfast for 5 days. 07/26/21 08/31/21  Martyn Ehrich, NP  PRILOSEC OTC 20 MG tablet Take 20 mg by mouth at bedtime. 11/13/19   [provider]  prochlorperazine (COMPAZINE) 10 MG tablet Take 1 tablet (10 mg total) by mouth every 6 (six) hours as needed (Nausea or vomiting). Patient not taking: Reported on 06/29/2021 01/29/21 07/20/21  Volanda Napoleon, MD      Allergies    Albuterol and Atorvastatin    Review  of Systems   Review of Systems  Cardiovascular:  Positive for palpitations.    Physical Exam Updated Vital Signs BP (!) 115/59 (BP Location: Right Arm)   Pulse (!) 160   Temp (!) 97.5 F (36.4 C) (Oral)   Resp (!) 22   SpO2 97%  Physical Exam Vitals and nursing note reviewed.  Constitutional:      General: He is not in acute distress.    Appearance: He is well-developed. He is not diaphoretic.  HENT:     Head: Normocephalic and atraumatic.  Eyes:     General: No scleral icterus.    Conjunctiva/sclera: Conjunctivae normal.   Cardiovascular:     Rate and Rhythm: Tachycardia present. Rhythm irregular.     Heart sounds: Normal heart sounds.  Pulmonary:     Effort: Pulmonary effort is normal. No respiratory distress.     Breath sounds: Wheezing present.  Abdominal:     Palpations: Abdomen is soft.     Tenderness: There is no abdominal tenderness.  Musculoskeletal:     Cervical back: Normal range of motion and neck supple.  Skin:    General: Skin is warm and dry.  Neurological:     Mental Status: He is alert.  Psychiatric:        Behavior: Behavior normal.     ED Results / Procedures / Treatments   Labs (all labs ordered are listed, but only abnormal results are displayed) Labs Reviewed - No data to display  EKG None  Radiology No results found.  Procedures Procedures  {Document cardiac monitor, telemetry assessment procedure when appropriate:1}  Medications Ordered in ED Medications - No data to display  ED Course/ Medical Decision Making/ A&P                           Medical Decision Making  ***  {Document critical care time when appropriate:1} {Document review of labs and clinical decision tools ie heart score, Chads2Vasc2 etc:1}  {Document your independent review of radiology images, and any outside records:1} {Document your discussion with family members, caretakers, and with consultants:1} {Document social determinants of health affecting pt's care:1} {Document your decision making why or why not admission, treatments were needed:1} Final Clinical Impression(s) / ED Diagnoses Final diagnoses:  None    Rx / DC Orders ED Discharge Orders     None

## 2021-08-01 NOTE — ED Triage Notes (Addendum)
Patient c/o palpitations with worsening SOB tonight. Hx afib, lung cancer. States currently on steroid taper.

## 2021-08-02 ENCOUNTER — Observation Stay (HOSPITAL_COMMUNITY): Payer: 59

## 2021-08-02 ENCOUNTER — Emergency Department (HOSPITAL_COMMUNITY): Payer: 59

## 2021-08-02 ENCOUNTER — Encounter (HOSPITAL_COMMUNITY): Payer: Self-pay | Admitting: Internal Medicine

## 2021-08-02 DIAGNOSIS — E876 Hypokalemia: Secondary | ICD-10-CM

## 2021-08-02 DIAGNOSIS — J449 Chronic obstructive pulmonary disease, unspecified: Secondary | ICD-10-CM | POA: Diagnosis not present

## 2021-08-02 DIAGNOSIS — I4891 Unspecified atrial fibrillation: Secondary | ICD-10-CM

## 2021-08-02 DIAGNOSIS — C3491 Malignant neoplasm of unspecified part of right bronchus or lung: Secondary | ICD-10-CM

## 2021-08-02 DIAGNOSIS — I2510 Atherosclerotic heart disease of native coronary artery without angina pectoris: Secondary | ICD-10-CM | POA: Diagnosis not present

## 2021-08-02 DIAGNOSIS — E782 Mixed hyperlipidemia: Secondary | ICD-10-CM | POA: Diagnosis present

## 2021-08-02 DIAGNOSIS — I482 Chronic atrial fibrillation, unspecified: Secondary | ICD-10-CM | POA: Diagnosis not present

## 2021-08-02 DIAGNOSIS — J189 Pneumonia, unspecified organism: Secondary | ICD-10-CM

## 2021-08-02 DIAGNOSIS — T50905A Adverse effect of unspecified drugs, medicaments and biological substances, initial encounter: Secondary | ICD-10-CM

## 2021-08-02 LAB — BASIC METABOLIC PANEL
Anion gap: 9 (ref 5–15)
BUN: 23 mg/dL — ABNORMAL HIGH (ref 6–20)
CO2: 23 mmol/L (ref 22–32)
Calcium: 8.2 mg/dL — ABNORMAL LOW (ref 8.9–10.3)
Chloride: 107 mmol/L (ref 98–111)
Creatinine, Ser: 1.03 mg/dL (ref 0.61–1.24)
GFR, Estimated: >60 mL/min (ref >60)
Glucose, Bld: 161 mg/dL — ABNORMAL HIGH (ref 70–99)
Potassium: 3.3 mmol/L — ABNORMAL LOW (ref 3.5–5.1)
Sodium: 139 mmol/L (ref 135–145)

## 2021-08-02 LAB — ECHOCARDIOGRAM COMPLETE
Area-P 1/2: 3.7 cm2
Height: 71 in
S' Lateral: 3.5 cm
Weight: 3678.4 oz

## 2021-08-02 LAB — MAGNESIUM: Magnesium: 2.2 mg/dL (ref 1.7–2.4)

## 2021-08-02 LAB — D-DIMER, QUANTITATIVE: D-Dimer, Quant: 3.61 ug/mL-FEU — ABNORMAL HIGH (ref 0.00–0.50)

## 2021-08-02 LAB — RAPID URINE DRUG SCREEN, HOSP PERFORMED
Amphetamines: NOT DETECTED
Barbiturates: NOT DETECTED
Benzodiazepines: NOT DETECTED
Cocaine: NOT DETECTED
Opiates: NOT DETECTED
Tetrahydrocannabinol: NOT DETECTED

## 2021-08-02 LAB — TSH: TSH: 1.582 u[IU]/mL (ref 0.350–4.500)

## 2021-08-02 LAB — BRAIN NATRIURETIC PEPTIDE: B Natriuretic Peptide: 69.1 pg/mL (ref 0.0–100.0)

## 2021-08-02 LAB — TROPONIN I (HIGH SENSITIVITY): Troponin I (High Sensitivity): 2 ng/L (ref <18)

## 2021-08-02 MED ORDER — ONDANSETRON HCL 4 MG PO TABS: 4.0000 mg | ORAL_TABLET | Freq: Four times a day (QID) | ORAL | Status: DC | PRN

## 2021-08-02 MED ORDER — OMEPRAZOLE MAGNESIUM 20 MG PO TBEC: 20.0000 mg | DELAYED_RELEASE_TABLET | Freq: Every day | ORAL | Status: DC

## 2021-08-02 MED ORDER — PERFLUTREN LIPID MICROSPHERE
1.0000 mL | INTRAVENOUS | Status: AC | PRN
  Administered 2021-08-02: 2 mL via INTRAVENOUS

## 2021-08-02 MED ORDER — DILTIAZEM HCL ER COATED BEADS 180 MG PO CP24
180.0000 mg | ORAL_CAPSULE | Freq: Every day | ORAL | Status: DC
  Administered 2021-08-02 – 2021-08-03 (×2): 180 mg via ORAL
  Filled 2021-08-02 (×2): qty 1

## 2021-08-02 MED ORDER — POLYETHYLENE GLYCOL 3350 17 G PO PACK
17.0000 g | PACK | Freq: Every day | ORAL | Status: DC | PRN
Start: 2021-08-02 — End: 2021-08-07

## 2021-08-02 MED ORDER — ONDANSETRON HCL 4 MG/2ML IJ SOLN: 4.0000 mg | Freq: Four times a day (QID) | INTRAMUSCULAR | Status: DC | PRN

## 2021-08-02 MED ORDER — PREDNISONE 20 MG PO TABS
40.0000 mg | ORAL_TABLET | Freq: Every day | ORAL | Status: DC
  Administered 2021-08-02 – 2021-08-07 (×6): 40 mg via ORAL
  Filled 2021-08-02 (×6): qty 2

## 2021-08-02 MED ORDER — BENZONATATE 100 MG PO CAPS
200.0000 mg | ORAL_CAPSULE | Freq: Three times a day (TID) | ORAL | Status: DC | PRN
  Filled 2021-08-02: qty 2

## 2021-08-02 MED ORDER — METOPROLOL SUCCINATE ER 25 MG PO TB24: 25.0000 mg | ORAL_TABLET | Freq: Every day | ORAL | Status: DC | PRN

## 2021-08-02 MED ORDER — METOPROLOL SUCCINATE ER 50 MG PO TB24: 25.0000 mg | ORAL_TABLET | ORAL | Status: DC

## 2021-08-02 MED ORDER — ACETAMINOPHEN 650 MG RE SUPP: 650.0000 mg | Freq: Four times a day (QID) | RECTAL | Status: DC | PRN

## 2021-08-02 MED ORDER — ACETAMINOPHEN 325 MG PO TABS
650.0000 mg | ORAL_TABLET | Freq: Four times a day (QID) | ORAL | Status: DC | PRN
  Administered 2021-08-03: 325 mg via ORAL
  Administered 2021-08-05: 650 mg via ORAL
  Filled 2021-08-02 (×2): qty 2

## 2021-08-02 MED ORDER — METOPROLOL SUCCINATE ER 25 MG PO TB24: 25.0000 mg | ORAL_TABLET | Freq: Every day | ORAL | Status: DC

## 2021-08-02 MED ORDER — DILTIAZEM HCL-DEXTROSE 125-5 MG/125ML-% IV SOLN (PREMIX)
5.0000 mg/h | INTRAVENOUS | Status: DC
  Administered 2021-08-02: 7.5 mg/h via INTRAVENOUS
  Administered 2021-08-02: 5 mg/h via INTRAVENOUS
  Filled 2021-08-02: qty 125

## 2021-08-02 MED ORDER — PAROXETINE HCL 10 MG PO TABS
10.0000 mg | ORAL_TABLET | Freq: Every day | ORAL | Status: DC
  Administered 2021-08-02 – 2021-08-06 (×5): 10 mg via ORAL
  Filled 2021-08-02 (×6): qty 1

## 2021-08-02 MED ORDER — APIXABAN 5 MG PO TABS
5.0000 mg | ORAL_TABLET | Freq: Two times a day (BID) | ORAL | Status: DC
  Administered 2021-08-02 – 2021-08-07 (×11): 5 mg via ORAL
  Filled 2021-08-02 (×11): qty 1

## 2021-08-02 MED ORDER — LEVALBUTEROL HCL 0.63 MG/3ML IN NEBU: 0.6300 mg | INHALATION_SOLUTION | Freq: Four times a day (QID) | RESPIRATORY_TRACT | Status: DC | PRN

## 2021-08-02 MED ORDER — POTASSIUM CHLORIDE CRYS ER 20 MEQ PO TBCR
40.0000 meq | EXTENDED_RELEASE_TABLET | Freq: Once | ORAL | Status: AC
Start: 2021-08-02 — End: 2021-08-02
  Administered 2021-08-02: 40 meq via ORAL
  Filled 2021-08-02: qty 2

## 2021-08-02 MED ORDER — PANTOPRAZOLE SODIUM 40 MG PO TBEC
40.0000 mg | DELAYED_RELEASE_TABLET | Freq: Every day | ORAL | Status: DC
  Administered 2021-08-02 – 2021-08-07 (×6): 40 mg via ORAL
  Filled 2021-08-02 (×6): qty 1

## 2021-08-02 NOTE — H&P (Signed)
History and Physical    Patient: John [name] MRN: [id number] DOA: 08/01/2021  Date of Service: the patient was seen and examined on 08/02/2021  Patient coming from: Home  Chief Complaint:  Chief Complaint  Patient presents with   Palpitations    HPI:   61 year old male with past medical history of stage IV squamous cell lung cancer (currently on Taxotere managed by [name], Beryle Flock induced pneumonitis Dx 12/2020 on prolonged steroid taper due to extensive drug-induced pulmonary fibrosis, coronary artery disease (LAD and circumflex disease seen on CT) , rheumatoid arthritis, COPD with asthma, recurrent pneumothoraces, anemia of chronic disease, chronic atrial fibrillation on Eliquis who presents to [facility] emergency department with complaints of shortness of breath and palpitations.  Patient explains that for approximately the past 24 hours he has been experiencing intermittent lightheadedness and palpitations.  Instead of seeking medical attention patient initially took an extra dose of his home regimen of metoprolol which improved his symptoms somewhat.  Overnight, the patient's symptoms continue to worsen.  Patient began to develop shortness of breath.  Initially mild but rapidly becoming more and more severe in intensity worse with any exertion whatsoever.  Patient denies any associated fevers, chest pain, sick contacts leg swelling pillow orthopnea or paroxysmal nocturnal dyspnea.  Due to patient's persisting symptoms the patient eventually presented to [facility] emergency department for evaluation.  Upon evaluation in the emergency department patient was found to be in rapid atrial fibrillation with heart rates initially in the 140s.  Diltiazem infusion has been initiated with some improvement in symptoms.  Due to ongoing rapid atrial fibrillation the hospitalist group has been called to assess the patient for admission to the hospital.    Review of  Systems: Review of Systems  Respiratory:  Positive for shortness of breath.   Cardiovascular:  Positive for palpitations.  All other systems reviewed and are negative.    Past Medical History:  Diagnosis Date   Atrial fibrillation with RVR ([place]) 05/12/2020   Dyspnea    Family history of adverse reaction to anesthesia    brother with seizures had episode under anesthesia.  Patient has seizures as well.   GERD (gastroesophageal reflux disease)    History of radiation therapy 04/24/2020-05/16/2020   IMRT to right lung     Dr [name]   PAF (paroxysmal atrial fibrillation) ([place])    CHADS2VSAC score 0   Rheumatoid aortitis    Sepsis ([place]) 10/06/2020   Squamous cell lung cancer [facility])     Past Surgical History:  Procedure Laterality Date   BRONCHIAL BRUSHINGS  03/27/2020   Procedure: BRONCHIAL BRUSHINGS;  Surgeon: [name], MD;  Location: [place];  Service: Cardiopulmonary;;   BUBBLE STUDY  10/13/2020   Procedure: BUBBLE STUDY;  Surgeon: Pixie Casino, MD;  Location: [place];  Service: Cardiovascular;;   CARDIOVERSION N/A 10/13/2020   Procedure: CARDIOVERSION;  Surgeon: Pixie Casino, MD;  Location: [facility] ENDOSCOPY;  Service: Cardiovascular;  Laterality: N/A;   CATARACT EXTRACTION  2016   at [place]  03/27/2020   Procedure: FINE NEEDLE ASPIRATION;  Surgeon: [name], MD;  Location: [place];  Service: Cardiopulmonary;;   HIP SURGERY Left    TEE WITHOUT CARDIOVERSION N/A 10/13/2020   Procedure: TRANSESOPHAGEAL ECHOCARDIOGRAM (TEE);  Surgeon: Pixie Casino, MD;  Location: [place];  Service: Cardiovascular;  Laterality: N/A;   VIDEO BRONCHOSCOPY WITH ENDOBRONCHIAL ULTRASOUND N/A 03/27/2020   Procedure: VIDEO BRONCHOSCOPY WITH ENDOBRONCHIAL  ULTRASOUND;  Surgeon: [name], MD;  Location: [facility] ENDOSCOPY;  Service: Cardiopulmonary;  Laterality: N/A;    Social History:  reports that he quit smoking about 5 years ago. His smoking  use included cigarettes. He has a 30.00 pack-year smoking history. He has never used smokeless tobacco. He reports that he does not currently use alcohol. He reports that he does not use drugs.  Allergies  Allergen Reactions   Albuterol Other (See Comments)    Patient has had episode of atrial fib following administration of albuterol (tolerates Xopenex well)   Atorvastatin Other (See Comments)    Cannot take at this time    Family History  Problem Relation Age of Onset   Arrhythmia Mother        has PPM   Heart disease Father        Died at 92, started in his 96s, heart attacks, had PPM and ICD    Prior to Admission medications   Medication Sig Start Date End Date Taking? Authorizing Provider  acetaminophen (TYLENOL) 500 MG tablet Take 500-1,000 mg by mouth every 6 (six) hours as needed for mild pain.   Yes [provider]  apixaban (ELIQUIS) 5 MG TABS tablet Take 1 tablet (5 mg total) by mouth 2 (two) times daily. 01/05/21  Yes Fenton, Clint R, PA  benzonatate (TESSALON) 200 MG capsule Take 1 capsule (200 mg total) by mouth 3 (three) times daily as needed for cough. 07/23/21  Yes Martyn Ehrich, NP  metoprolol succinate (TOPROL XL) 25 MG 24 hr tablet Take one tablet by mouth daily. Take additional dose (25 mg) for increase HR over 100. Patient taking differently: Take 25 mg by mouth See admin instructions. Take 25 mg by mouth once a day and an additional 25 mg for an increased heart rate over 100 06/23/21  Yes Swinyer, Lanice Schwab, NP  PARoxetine (PAXIL) 10 MG tablet Take 10 mg by mouth at bedtime. 01/25/20  Yes [provider]  predniSONE (DELTASONE) 10 MG tablet Take 4 tablets (40 mg total) by mouth daily with breakfast for 21 days, THEN 3 tablets (30 mg total) daily with breakfast for 5 days, THEN 2 tablets (20 mg total) daily with breakfast for 5 days, THEN 1 tablet (10 mg total) daily with breakfast for 5 days. 07/26/21 08/31/21 Yes Martyn Ehrich, NP  PRILOSEC  OTC 20 MG tablet Take 20 mg by mouth daily before breakfast. 11/13/19  Yes [provider]  Budeson-Glycopyrrol-Formoterol (BREZTRI AEROSPHERE) 160-9-4.8 MCG/ACT AERO Inhale 2 puffs into the lungs in the morning and at bedtime. Patient not taking: Reported on 08/01/2021 07/22/21   Martyn Ehrich, NP  ezetimibe (ZETIA) 10 MG tablet Take 1 tablet (10 mg total) by mouth daily. Patient not taking: Reported on 08/01/2021 06/23/21   Swinyer, Lanice Schwab, NP  prochlorperazine (COMPAZINE) 10 MG tablet Take 1 tablet (10 mg total) by mouth every 6 (six) hours as needed (Nausea or vomiting). Patient not taking: Reported on 06/29/2021 01/29/21 07/20/21  Volanda Napoleon, MD    Physical Exam:  Vitals:   08/02/21 0830 08/02/21 0915 08/02/21 0930 08/02/21 0932  BP: 112/84 (!) 110/92    Pulse: 86 (!) 46 99 (!) 108  Resp: (!) 22 18 20    Temp:  97.8 F (36.6 C)    TempSrc:  Oral    SpO2: 95% 98%      Constitutional: Awake alert and oriented x3, patient is in mild distress Skin: no rashes, no lesions,  good skin turgor noted. Eyes: Pupils are equally reactive to light.  No evidence of scleral icterus or conjunctival pallor.  ENMT: Moist mucous membranes noted.  Posterior pharynx clear of any exudate or lesions.   Neck: normal, supple, no masses, no thyromegaly.  No evidence of jugular venous distension.   Respiratory: notable rales in the bilateral mid and lower fields with intermittent expiratory wheezing.  Normal respiratory effort. No accessory muscle use.  Cardiovascular: Tachycardic rate with irregularly irregular rhythm no murmurs / rubs / gallops. No extremity edema. 2+ pedal pulses. No carotid bruits.  Chest:   Nontender without crepitus or deformity.   Back:   Nontender without crepitus or deformity. Abdomen: Abdomen is soft and nontender.  No evidence of intra-abdominal masses.  Positive bowel sounds noted in all quadrants.   Musculoskeletal: No joint deformity upper and lower extremities.  Good ROM, no contractures. Normal muscle tone.  Neurologic: CN 2-12 grossly intact. Sensation intact.  Patient moving all 4 extremities spontaneously.  Patient is following all commands.  Patient is responsive to verbal stimuli.   Psychiatric: Patient is extremely anxious with labile affect.  Patient seems to possess insight as to their current situation.    Data Reviewed:  I have personally reviewed and interpreted labs, imaging.  Significant findings are:  Chemistry revealing sodium 139, potassium 3.3, glucose 161, BUN 23, creatinine 1.03 Magnesium 2.2 CBC revealing white blood cell count of 17.5, hemoglobin of 12.1, hematocrit 38.1, platelet count of 197 Chest x-ray personally reviewed revealing evidence of right hilar mass as well as significant interstitial changes consistent with known pulmonary fibrosis  EKG: Personally reviewed.  Rhythm is atrial fibrillation with heart rate of 111 bpm.  Left anterior fascicular block.  No dynamic ST segment changes appreciated.    Assessment and Plan: * Chronic atrial fibrillation with rapid ventricular response (HCC) Patient presenting with atrial fibrillation with rapid ventricular response Patient initiated on intravenous AV nodal blocking agents including diltiazem infusion Home regimen of oral metoprolol resumed Continuing home regimen of anticoagulation with Eliquis Obtaining echocardiogram in the morning Monitoring patient on telemetry We will consider cardiology consultation if rate proves difficult to control or if patient continues to experience significant symptoms despite rate control. Avoiding amiodarone due to presence of pulmonary fibrosis Furthermore, obtaining magnesium, TSH, urine drug screen, troponin and D-dimer to evaluate for possible etiologies    Drug-induced pneumonitis Originally identified in November 2022 as diffuse pneumonitis related to [facility] use Patient is now suffering from sequela including diffuse  drug-induced pulmonary fibrosis Continuing prednisone steroid taper as instructed by his outpatient pulmonologist, patient is currently on 40 mg daily through the end of the month. Providing patient with supplemental oxygen, titrating as needed Patient reports he is "not allowed" to be on bronchodilator therapy according to his pulmonologist although I see no evidence of this in the notes. We will consider pulmonology consultation during this hospitalization if shortness of breath does not improve once patient's atrial fibrillation rate is controlled.  Hypokalemia Replacing with potassium chloride Evaluating for concurrent hypomagnesemia  Monitoring potassium levels with serial chemistries.   COPD with asthma ([place]) Mild wheezing on exam otherwise no obvious evidence of COPD/asthma exacerbation Patient reports that he is "not allowed" to take bronchodilators according to his pulmonologist.  Coronary artery disease involving native coronary artery of native heart without angina pectoris Patient is currently chest pain free Monitoring patient on telemetry Continue home regimen of Eliquis and AV nodal blocking therapy Patient is due to be started on  Repatha at the hyperlipidemia clinic   Stage 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 [facility]) Patient follows with 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 Silvestri during this hospitalization is [name] has been added to the treatment team.  I appreciate any input they may have in the management of this case. Patient reports he was post to follow-up with  radiation oncology tomorrow in the outpatient clinic.  If patient is still hospitalized tomorrow we will consider notifying radiation oncology to see if they will see him here.  Mixed hyperlipidemia Patient is currently undergoing prior authorization for Repatha in the hyperlipidemia clinic.        Code Status:  Full code  code status decision has been confirmed with: patient Family Communication: deferred   Consults: We will consider cardiology consultation if atrial fibrillation is not easily controlled.  [name] with oncology has been added to the treatment team, his input is appreciated.  We will consider pulmonology consultation if shortness of breath does not improve with rate control.  Severity of Illness:  The appropriate patient status for this patient is OBSERVATION. Observation status is judged to be reasonable and necessary in order to provide the required intensity of service to ensure the patient's safety. The patient's presenting symptoms, physical exam findings, and initial radiographic and laboratory data in the context of their medical condition is felt to place them at decreased risk for further clinical deterioration. Furthermore, it is anticipated that the patient will be medically stable for discharge from the hospital within 2 midnights of admission.   Author:  Vernelle Emerald MD  08/02/2021 9:47 AM

## 2021-08-02 NOTE — Progress Notes (Signed)
  Echocardiogram 2D Echocardiogram has been performed.  Darlina Sicilian M 08/02/2021, 11:33 AM

## 2021-08-02 NOTE — Assessment & Plan Note (Deleted)
.   Patient is currently undergoing prior authorization for Repatha in the hyperlipidemia clinic.

## 2021-08-02 NOTE — Assessment & Plan Note (Deleted)
   Originally identified in November 2022 as diffuse pneumonitis related to [facility] use  Patient is now suffering from sequela including diffuse drug-induced pulmonary fibrosis  Continuing prednisone steroid taper as instructed by his outpatient pulmonologist, patient is currently on 40 mg daily through the end of the month.  Providing patient with supplemental oxygen, titrating as needed  Patient reports he is "not allowed" to be on bronchodilator therapy according to his pulmonologist although I see no evidence of this in the notes.  We will consider pulmonology consultation during this hospitalization if shortness of breath does not improve once patient's atrial fibrillation rate is controlled.

## 2021-08-02 NOTE — Assessment & Plan Note (Signed)
·   Replacing with potassium chloride °· Evaluating for concurrent hypomagnesemia  °· Monitoring potassium levels with serial chemistries. ° °

## 2021-08-02 NOTE — Assessment & Plan Note (Deleted)
   Patient follows with 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 has been added to the treatment team.  I appreciate any input they may have in the management of this case.  Patient reports he was post to follow-up with radiation oncology tomorrow in the outpatient clinic.  If patient is still hospitalized tomorrow we will consider notifying radiation oncology to see if they will see him here.

## 2021-08-02 NOTE — Progress Notes (Signed)
[facility] PLAN OF CARE NOTE Patient: John [name] YCX:[id number]   PCP: [name], MD DOB: Mar 13, 1960   DOA: 08/01/2021   DOS: 08/02/2021    Patient was admitted by my colleague earlier on 08/02/2021. I have reviewed the H&P as well as assessment and plan and agree with the same. Important changes in the plan are listed below.  Plan of care: Principal Problem:   Chronic atrial fibrillation with rapid ventricular response (HCC) Active Problems:   Drug-induced pneumonitis   Hypokalemia   COPD with asthma ([place])   Coronary artery disease involving native coronary artery of native heart without angina pectoris   Stage 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 (HCC)   Mixed hyperlipidemia Echocardiogram shows preserved EF.  No valvular abnormalities. Patient's symptoms are improving. At present patient is on Cardizem. Given his respiratory illness I will transition him from metoprolol to Cardizem. Patient has been on 7.5 mg every hour Cardizem infusion. Transition to 180 mg Cardizem. Already on Eliquis. Remained stable overnight likely discharge tomorrow morning.  Author: Berle Mull, MD [facility] 08/02/2021 7:46 PM   If 7PM-7AM, please contact night-coverage at [email or web address]

## 2021-08-02 NOTE — Assessment & Plan Note (Deleted)
.   Patient presenting with atrial fibrillation with rapid ventricular response . Patient initiated on intravenous AV nodal blocking agents including diltiazem infusion . Home regimen of oral metoprolol resumed . Continuing home regimen of anticoagulation with Eliquis . Obtaining echocardiogram in the morning . Monitoring patient on telemetry . We will consider cardiology consultation if rate proves difficult to control or if patient continues to experience significant symptoms despite rate control. . Avoiding amiodarone due to presence of pulmonary fibrosis . Furthermore, obtaining magnesium, TSH, urine drug screen, troponin and D-dimer to evaluate for possible etiologies

## 2021-08-02 NOTE — Assessment & Plan Note (Deleted)
   Mild wheezing on exam otherwise no obvious evidence of COPD/asthma exacerbation  Patient reports that he is "not allowed" to take bronchodilators according to his pulmonologist.

## 2021-08-02 NOTE — Assessment & Plan Note (Signed)
.   Patient is currently chest pain free . Monitoring patient on telemetry . Continue home regimen of Eliquis and AV nodal blocking therapy . Patient is due to be started on Repatha at the hyperlipidemia clinic

## 2021-08-03 ENCOUNTER — Ambulatory Visit
Admission: RE | Admit: 2021-08-03 | Discharge: 2021-08-03 | Disposition: A | Discharge status: Home / Self Care | Payer: 59 | Source: Ambulatory Visit | Attending: Radiation Oncology | Admitting: Radiation Oncology

## 2021-08-03 ENCOUNTER — Other Ambulatory Visit: Payer: Self-pay

## 2021-08-03 DIAGNOSIS — T451X5A Adverse effect of antineoplastic and immunosuppressive drugs, initial encounter: Secondary | ICD-10-CM | POA: Diagnosis present

## 2021-08-03 DIAGNOSIS — Z6832 Body mass index (BMI) 32.0-32.9, adult: Secondary | ICD-10-CM | POA: Diagnosis not present

## 2021-08-03 DIAGNOSIS — K219 Gastro-esophageal reflux disease without esophagitis: Secondary | ICD-10-CM | POA: Diagnosis present

## 2021-08-03 DIAGNOSIS — J9611 Chronic respiratory failure with hypoxia: Secondary | ICD-10-CM | POA: Diagnosis present

## 2021-08-03 DIAGNOSIS — Z87891 Personal history of nicotine dependence: Secondary | ICD-10-CM

## 2021-08-03 DIAGNOSIS — E669 Obesity, unspecified: Secondary | ICD-10-CM | POA: Diagnosis present

## 2021-08-03 DIAGNOSIS — E782 Mixed hyperlipidemia: Secondary | ICD-10-CM | POA: Diagnosis present

## 2021-08-03 DIAGNOSIS — Z7901 Long term (current) use of anticoagulants: Secondary | ICD-10-CM | POA: Diagnosis not present

## 2021-08-03 DIAGNOSIS — I480 Paroxysmal atrial fibrillation: Secondary | ICD-10-CM | POA: Diagnosis present

## 2021-08-03 DIAGNOSIS — M7989 Other specified soft tissue disorders: Secondary | ICD-10-CM | POA: Diagnosis present

## 2021-08-03 DIAGNOSIS — C3490 Malignant neoplasm of unspecified part of unspecified bronchus or lung: Secondary | ICD-10-CM | POA: Diagnosis not present

## 2021-08-03 DIAGNOSIS — C3401 Malignant neoplasm of right main bronchus: Secondary | ICD-10-CM | POA: Diagnosis present

## 2021-08-03 DIAGNOSIS — R609 Edema, unspecified: Secondary | ICD-10-CM | POA: Diagnosis not present

## 2021-08-03 DIAGNOSIS — M549 Dorsalgia, unspecified: Secondary | ICD-10-CM | POA: Diagnosis present

## 2021-08-03 DIAGNOSIS — I700 Atherosclerosis of aorta: Secondary | ICD-10-CM | POA: Diagnosis present

## 2021-08-03 DIAGNOSIS — I482 Chronic atrial fibrillation, unspecified: Secondary | ICD-10-CM | POA: Diagnosis not present

## 2021-08-03 DIAGNOSIS — D630 Anemia in neoplastic disease: Secondary | ICD-10-CM | POA: Diagnosis present

## 2021-08-03 DIAGNOSIS — C7971 Secondary malignant neoplasm of right adrenal gland: Secondary | ICD-10-CM | POA: Diagnosis present

## 2021-08-03 DIAGNOSIS — Z888 Allergy status to other drugs, medicaments and biological substances status: Secondary | ICD-10-CM | POA: Diagnosis not present

## 2021-08-03 DIAGNOSIS — E876 Hypokalemia: Secondary | ICD-10-CM | POA: Diagnosis present

## 2021-08-03 DIAGNOSIS — I4891 Unspecified atrial fibrillation: Secondary | ICD-10-CM | POA: Diagnosis not present

## 2021-08-03 DIAGNOSIS — Z8249 Family history of ischemic heart disease and other diseases of the circulatory system: Secondary | ICD-10-CM | POA: Diagnosis not present

## 2021-08-03 DIAGNOSIS — R002 Palpitations: Secondary | ICD-10-CM | POA: Diagnosis present

## 2021-08-03 DIAGNOSIS — I2510 Atherosclerotic heart disease of native coronary artery without angina pectoris: Secondary | ICD-10-CM | POA: Diagnosis present

## 2021-08-03 DIAGNOSIS — M069 Rheumatoid arthritis, unspecified: Secondary | ICD-10-CM | POA: Diagnosis present

## 2021-08-03 DIAGNOSIS — J449 Chronic obstructive pulmonary disease, unspecified: Secondary | ICD-10-CM | POA: Diagnosis present

## 2021-08-03 DIAGNOSIS — Z79899 Other long term (current) drug therapy: Secondary | ICD-10-CM | POA: Diagnosis not present

## 2021-08-03 DIAGNOSIS — Z923 Personal history of irradiation: Secondary | ICD-10-CM | POA: Diagnosis not present

## 2021-08-03 DIAGNOSIS — J704 Drug-induced interstitial lung disorders, unspecified: Secondary | ICD-10-CM | POA: Diagnosis present

## 2021-08-03 LAB — CBC WITH DIFFERENTIAL/PLATELET
Abs Immature Granulocytes: 0.18 10*3/uL — ABNORMAL HIGH (ref 0.00–0.07)
Basophils Absolute: 0 10*3/uL (ref 0.0–0.1)
Basophils Relative: 0 %
Eosinophils Absolute: 0.1 10*3/uL (ref 0.0–0.5)
Eosinophils Relative: 1 %
HCT: 37.4 % — ABNORMAL LOW (ref 39.0–52.0)
Hemoglobin: 11.6 g/dL — ABNORMAL LOW (ref 13.0–17.0)
Immature Granulocytes: 2 %
Lymphocytes Relative: 8 %
Lymphs Abs: 0.9 10*3/uL (ref 0.7–4.0)
MCH: 27.5 pg (ref 26.0–34.0)
MCHC: 31 g/dL (ref 30.0–36.0)
MCV: 88.6 fL (ref 80.0–100.0)
Monocytes Absolute: 1 10*3/uL (ref 0.1–1.0)
Monocytes Relative: 8 %
Neutro Abs: 9.2 10*3/uL — ABNORMAL HIGH (ref 1.7–7.7)
Neutrophils Relative %: 81 %
Platelets: 164 10*3/uL (ref 150–400)
RBC: 4.22 MIL/uL (ref 4.22–5.81)
RDW: 19.6 % — ABNORMAL HIGH (ref 11.5–15.5)
WBC: 11.3 10*3/uL — ABNORMAL HIGH (ref 4.0–10.5)
nRBC: 0 % (ref 0.0–0.2)

## 2021-08-03 LAB — COMPREHENSIVE METABOLIC PANEL
ALT: 25 U/L (ref 0–44)
AST: 12 U/L — ABNORMAL LOW (ref 15–41)
Albumin: 2.8 g/dL — ABNORMAL LOW (ref 3.5–5.0)
Alkaline Phosphatase: 64 U/L (ref 38–126)
Anion gap: 8 (ref 5–15)
BUN: 18 mg/dL (ref 6–20)
CO2: 29 mmol/L (ref 22–32)
Calcium: 8.5 mg/dL — ABNORMAL LOW (ref 8.9–10.3)
Chloride: 104 mmol/L (ref 98–111)
Creatinine, Ser: 0.88 mg/dL (ref 0.61–1.24)
GFR, Estimated: >60 mL/min (ref >60)
Glucose, Bld: 116 mg/dL — ABNORMAL HIGH (ref 70–99)
Potassium: 3.4 mmol/L — ABNORMAL LOW (ref 3.5–5.1)
Sodium: 141 mmol/L (ref 135–145)
Total Bilirubin: 0.7 mg/dL (ref 0.3–1.2)
Total Protein: 5.5 g/dL — ABNORMAL LOW (ref 6.5–8.1)

## 2021-08-03 LAB — MAGNESIUM: Magnesium: 2.3 mg/dL (ref 1.7–2.4)

## 2021-08-03 MED ORDER — METOPROLOL SUCCINATE ER 25 MG PO TB24
25.0000 mg | ORAL_TABLET | Freq: Every day | ORAL | Status: DC
  Administered 2021-08-03 – 2021-08-04 (×2): 25 mg via ORAL
  Filled 2021-08-03 (×2): qty 1

## 2021-08-03 MED ORDER — POTASSIUM CHLORIDE CRYS ER 20 MEQ PO TBCR
40.0000 meq | EXTENDED_RELEASE_TABLET | ORAL | Status: AC
  Administered 2021-08-03 (×2): 40 meq via ORAL
  Filled 2021-08-03 (×2): qty 2

## 2021-08-03 MED ORDER — AMIODARONE LOAD VIA INFUSION
150.0000 mg | Freq: Once | INTRAVENOUS | Status: AC
  Administered 2021-08-03: 150 mg via INTRAVENOUS
  Filled 2021-08-03: qty 83.34

## 2021-08-03 MED ORDER — AMIODARONE HCL IN DEXTROSE 360-4.14 MG/200ML-% IV SOLN
60.0000 mg/h | INTRAVENOUS | Status: AC
  Administered 2021-08-03 (×2): 60 mg/h via INTRAVENOUS
  Filled 2021-08-03 (×2): qty 200

## 2021-08-03 MED ORDER — AMIODARONE HCL IN DEXTROSE 360-4.14 MG/200ML-% IV SOLN
30.0000 mg/h | INTRAVENOUS | Status: DC
  Administered 2021-08-04 – 2021-08-06 (×5): 30 mg/h via INTRAVENOUS
  Filled 2021-08-03 (×7): qty 200

## 2021-08-03 NOTE — Consult Note (Signed)
Referral MD  Reason for Referral: Rapid atrial fibrillation; metastatic non-small cell lung cancer  Chief Complaint  Patient presents with   Palpitations  : I had palpitations.  HPI: John [name] is well-known to me.  He is really nice 61 year old white male.  His 61th birthday is actually can be on Saturday.  He really wants to be home for this.  He has metastatic squamous cell carcinoma.  He currently is to have radiation therapy for left adrenal mass.  I do not think he started radiation yet.  Since he is in the hospital, the reason for him to go downstairs.  He was on Eliquis already.  He was admitted on 08/01/2021.  I think he was placed on Cardizem.  Then the transition over to oral Cardizem.  His labs today show white cell count 11.3.  Hemoglobin 9.6.  Platelet count under 64,000.  His BUN is 18 creatinine 0.88.  Calcium 8.5.  Albumin 2.8.  He actually looks pretty good.  He feels pretty good.  He says he is not having any chest discomfort.  He does not feel short of breath.  He does have the discomfort from the left adrenal mass.  Again, hopefully will be able to start radiation for this.  He had an echocardiogram done yesterday.  He had a left ventricular ejection fraction of 60-65%.  He has had no fever.  He has been on a steroid taper.  This is because of some pneumonitis that he has had.  He has had multiple pneumothoraces.  Thankfully, this is not a problem right now.  I think he has been eating fairly well.  He has had no bleeding.  There has s been no leg swelling.  He has been trying to work from home.  Overall, I would say his performance status is probably ECOG 2.  Past Medical History:  Diagnosis Date   Atrial fibrillation with RVR ([name]) 05/12/2020   Dyspnea    Family history of adverse reaction to anesthesia    brother with seizures had episode under anesthesia.  Patient has seizures as well.   GERD (gastroesophageal reflux disease)    History of  radiation therapy 04/24/2020-05/16/2020   IMRT to right lung     Dr [name]   PAF (paroxysmal atrial fibrillation) ([place])    CHADS2VSAC score 0   Rheumatoid aortitis    Sepsis ([place]) 10/06/2020   Squamous cell lung cancer ([place])   :   Past Surgical History:  Procedure Laterality Date   BRONCHIAL BRUSHINGS  03/27/2020   Procedure: BRONCHIAL BRUSHINGS;  Surgeon: [name], MD;  Location: [place];  Service: Cardiopulmonary;;   BUBBLE STUDY  10/13/2020   Procedure: BUBBLE STUDY;  Surgeon: Pixie Casino, MD;  Location: [place];  Service: Cardiovascular;;   CARDIOVERSION N/A 10/13/2020   Procedure: CARDIOVERSION;  Surgeon: Pixie Casino, MD;  Location: [facility] ENDOSCOPY;  Service: Cardiovascular;  Laterality: N/A;   CATARACT EXTRACTION  2016   at [place]  03/27/2020   Procedure: FINE NEEDLE ASPIRATION;  Surgeon: [name], MD;  Location: [place];  Service: Cardiopulmonary;;   HIP SURGERY Left    TEE WITHOUT CARDIOVERSION N/A 10/13/2020   Procedure: TRANSESOPHAGEAL ECHOCARDIOGRAM (TEE);  Surgeon: Pixie Casino, MD;  Location: [place];  Service: Cardiovascular;  Laterality: N/A;   VIDEO BRONCHOSCOPY WITH ENDOBRONCHIAL ULTRASOUND N/A 03/27/2020   Procedure: VIDEO BRONCHOSCOPY WITH ENDOBRONCHIAL ULTRASOUND;  Surgeon: [name], MD;  Location: [facility]  ENDOSCOPY;  Service: Cardiopulmonary;  Laterality: N/A;  :   Current Facility-Administered Medications:    acetaminophen (TYLENOL) tablet 650 mg, 650 mg, Oral, Q6H PRN **OR** acetaminophen (TYLENOL) suppository 650 mg, 650 mg, Rectal, Q6H PRN, Shalhoub, Sherryll Burger, MD   apixaban Arne Cleveland) tablet 5 mg, 5 mg, Oral, BID, Shalhoub, Sherryll Burger, MD, 5 mg at 08/02/21 2127   benzonatate (TESSALON) capsule 200 mg, 200 mg, Oral, TID PRN, Lavina Hamman, MD   diltiazem (CARDIZEM CD) 24 hr capsule 180 mg, 180 mg, Oral, Daily, Berle Mull M, MD, 180 mg at 08/02/21 1748   diltiazem (CARDIZEM) 125 mg in  dextrose 5% 125 mL (1 mg/mL) infusion, 5-15 mg/hr, Intravenous, Continuous, Shalhoub, Sherryll Burger, MD, Stopped at 08/02/21 1850   ondansetron (ZOFRAN) tablet 4 mg, 4 mg, Oral, Q6H PRN **OR** ondansetron (ZOFRAN) injection 4 mg, 4 mg, Intravenous, Q6H PRN, Shalhoub, Sherryll Burger, MD   pantoprazole (PROTONIX) EC tablet 40 mg, 40 mg, Oral, Daily, Berle Mull M, MD, 40 mg at 08/02/21 1050   PARoxetine (PAXIL) tablet 10 mg, 10 mg, Oral, QHS, Shalhoub, Sherryll Burger, MD, 10 mg at 08/02/21 2127   polyethylene glycol (MIRALAX / GLYCOLAX) packet 17 g, 17 g, Oral, Daily PRN, Shalhoub, Sherryll Burger, MD   predniSONE (DELTASONE) tablet 40 mg, 40 mg, Oral, Q breakfast, Shalhoub, Sherryll Burger, MD, 40 mg at 08/02/21 1051:   apixaban  5 mg Oral BID   diltiazem  180 mg Oral Daily   pantoprazole  40 mg Oral Daily   PARoxetine  10 mg Oral QHS   predniSONE  40 mg Oral Q breakfast  :   Allergies  Allergen Reactions   Albuterol Other (See Comments)    Patient has had episode of atrial fib following administration of albuterol (tolerates Xopenex well)   Atorvastatin Other (See Comments)    Cannot take at this time  :   Family History  Problem Relation Age of Onset   Arrhythmia Mother        has PPM   Heart disease Father        Died at 51, started in his 9s, heart attacks, had PPM and ICD  :   Social History   Socioeconomic History   Marital status: Married    Spouse name: Not on file   Number of children: Not on file   Years of education: Not on file   Highest education level: Not on file  Occupational History   Not on file  Tobacco Use   Smoking status: Former    Packs/day: 1.00    Years: 30.00    Total pack years: 30.00    Types: Cigarettes    Quit date: 2018    Years since quitting: 5.4   Smokeless tobacco: Never   Tobacco comments:    Former smoker 10/28/2020  Vaping Use   Vaping Use: Never used  Substance and Sexual Activity   Alcohol use: Not Currently   Drug use: Never   Sexual activity:  Not on file  Other Topics Concern   Not on file  Social History Narrative   ** Merged History Encounter **       Social Determinants of Health   Financial Resource Strain: Not on file  Food Insecurity: Not on file  Transportation Needs: Not on file  Physical Activity: Not on file  Stress: Not on file  Social Connections: Not on file  Intimate Partner Violence: Not on file  :  Review of Systems  Constitutional:  Negative.   HENT: Negative.    Eyes:  Positive for redness.  Respiratory:  Positive for shortness of breath.   Cardiovascular:  Positive for palpitations.  Gastrointestinal: Negative.   Genitourinary: Negative.   Musculoskeletal:  Positive for back pain.  Skin: Negative.   Neurological: Negative.   Endo/Heme/Allergies: Negative.   Psychiatric/Behavioral: Negative.       Exam: Patient Vitals for the past 24 hrs:  BP Temp Temp src Pulse Resp SpO2 Height Weight  08/03/21 0447 115/80 (!) 97.5 F (36.4 C) Oral 99 18 94 % -- --  08/02/21 2052 113/85 97.9 F (36.6 C) Oral 65 18 99 % -- --  08/02/21 1850 110/78 -- -- 64 18 99 % -- --  08/02/21 1719 108/68 -- -- 84 18 95 % -- --  08/02/21 1619 103/78 -- -- 85 -- 98 % -- --  08/02/21 1448 112/78 -- -- 80 18 98 % -- --  08/02/21 1245 103/73 -- -- (!) 129 18 99 % -- --  08/02/21 1200 100/73 97.6 F (36.4 C) Oral 93 16 98 % -- --  08/02/21 1111 97/72 97.6 F (36.4 C) Oral (!) 120 18 99 % -- --  08/02/21 1051 109/72 -- -- 88 -- 99 % -- --  08/02/21 1005 108/84 -- -- (!) 161 -- 99 % 5\' 11"  (1.803 m) 229 lb 14.4 oz (104.3 kg)  08/02/21 0932 -- -- -- (!) 108 -- -- -- --  08/02/21 0930 -- -- -- 99 20 -- -- --  08/02/21 0915 (!) 110/92 97.8 F (36.6 C) Oral (!) 46 18 98 % -- --  08/02/21 0830 112/84 -- -- 86 (!) 22 95 % -- --  08/02/21 0825 116/78 (!) 97.5 F (36.4 C) Oral (!) 59 (!) 21 96 % -- --    Physical Activity: Not on file   Physical Exam Vitals reviewed.  HENT:     Head: Normocephalic and atraumatic.   Eyes:     Pupils: Pupils are equal, round, and reactive to light.  Cardiovascular:     Rate and Rhythm: Normal rate and regular rhythm.     Heart sounds: Normal heart sounds.     Comments: Cardiac exam shows an irregular rate and rhythm consistent with atrial fibrillation.  The rate is fairly well controlled. Pulmonary:     Effort: Pulmonary effort is normal.     Breath sounds: Normal breath sounds.  Abdominal:     General: Bowel sounds are normal.     Palpations: Abdomen is soft.  Musculoskeletal:        General: No tenderness or deformity. Normal range of motion.     Cervical back: Normal range of motion.  Lymphadenopathy:     Cervical: No cervical adenopathy.  Skin:    General: Skin is warm and dry.     Findings: No erythema or rash.  Neurological:     Mental Status: He is alert and oriented to person, place, and time.  Psychiatric:        Behavior: Behavior normal.        Thought Content: Thought content normal.        Judgment: Judgment normal.     Recent Labs    08/01/21 2301 08/03/21 0418  WBC 17.5* 11.3*  HGB 12.1* 11.6*  HCT 38.1* 37.4*  PLT 197 164    Recent Labs    08/01/21 2301 08/03/21 0418  NA 139 141  K 3.3* 3.4*  CL 107 104  CO2 23  29  GLUCOSE 161* 116*  BUN 23* 18  CREATININE 1.03 0.88  CALCIUM 8.2* 8.5*    Blood smear review: None  Pathology: None    Assessment and Plan: John [name] is a very nice 61 year old white male.  He has metastatic squamous cell carcinoma of the lung.  We do not have him on chemotherapy right now.  Our goal is for palliative treatment with radiation therapy to this left adrenal mass.  He has had atrial fibrillation.  He is on Cardizem right now.  His rate seems to be doing quite well.  He says he might go home today.  I certainly would have no problems with this.  He actually does look quite good.  We just need to let Radiation Oncology know that he is in the hospital.  Maybe they can get treatment started on  him today.  His labs look pretty good.  We will initiate systemic therapy on him once radiation is finished.  I am not sure what protocol we can use.  This will really be dependent upon his overall performance status.  Again, he does not look all that bad.  We had a good prayer.  He is showing a lot of toughness.   [name], MD  1 Mikeal Hawthorne 2:2

## 2021-08-03 NOTE — Progress Notes (Signed)
  Progress Note Patient: John [name] VEL:[id number] DOB: 16-Dec-1960 DOA: 08/01/2021  DOS: the patient was seen and examined on 08/03/2021  Brief hospital course: Past medical history of stage IV squamous cell lung cancer currently on chemotherapy and radiation, CAD, pulmonary fibrosis, COPD, arthritis, anemia,, obesity, on chronic steroids, chronic respiratory failure on 4 LPM at home presents to the hospital with complaints of palpitation and found to have A-fib with RVR. Cardiology consulted due to uncontrolled heart rates..  Assessment and Plan: Principal Problem: Paroxysmal atrial fibrillation with rapid ventricular response Patient does not have chronic A-fib Echocardiogram shows preserved EF. Patient was started on Cardizem 180 mg daily but heart rate remains elevated on exertion. Cardiology consulted. Started on IV amiodarone infusion along with transition back to Toprol-XL. Monitor on cardiac floor. Potassium more than 4.  Magnesium more than 2.    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 Continue.    Hypokalemia Currently being replaced. Monitor.    COPD with asthma ([place]) No evidence of exacerbation for now. For now monitor and inhalers.    Coronary artery disease involving native coronary artery of native heart without angina pectoris No evidence of acute ischemia. For now monitor. Echocardiogram shows preserved EF without any wall motion normality.    Stage 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 [facility]) Appreciate oncology consultation. Radiation oncology was also notified that the patient is actually still going to be in the hospital and can undergo radiation.    Mixed hyperlipidemia Continue statin.  Obesity Body mass index is 32.06 kg/m.  Placing the pt at higher risk of poor outcomes.  Subjective: Denies any chest pain or abdominal pain.  No nausea no vomiting or no fever no chills.  No diarrhea no constipation.  Physical Exam: Vitals:   08/03/21  0447 08/03/21 1007 08/03/21 1137 08/03/21 1607  BP: 115/80 119/68 109/82 114/72  Pulse: 99 (!) 101 65 80  Resp: 18 20 18 18   Temp: (!) 97.5 F (36.4 C) 97.7 F (36.5 C) 97.6 F (36.4 C) 98.6 F (37 C)  TempSrc: Oral Oral Oral Oral  SpO2: 94% 98% 98% 98%  Weight:      Height:       General: Appear in moderate distress; no visible Abnormal Neck Mass Or lumps, Conjunctiva normal Cardiovascular: S1 and S2 Present, no Murmur, Respiratory: good respiratory effort, Bilateral Air entry present and CTA, no Crackles, no wheezes Abdomen: Bowel Sound present, Non tender  Extremities: no Pedal edema Neurology: alert and oriented to time, place, and person  Gait not checked due to patient safety concerns   Data Reviewed: I have Reviewed nursing notes, Vitals, and Lab results since pt's last encounter. Pertinent lab results CBC and BMP I have ordered test including CBC and BMP and magnesium level I have discussed pt's care plan and test results with cardiology.   Family Communication: Wife at bedside  Disposition: Status is: Inpatient Remains inpatient appropriate because: Requiring IV amiodarone therapy for now.  Author: Berle Mull, MD 08/03/2021 8:09 PM  Please look on [email or web address] to find out who is on call.

## 2021-08-03 NOTE — Consult Note (Signed)
Cardiology Consultation:   Patient ID: John [name] MRN: [id number]; DOB: 02-15-1961  Admit date: 08/01/2021 Date of Consult: 08/03/2021  PCP:  [name], MD   [facility] Providers Cardiologist:  Fransico Him, MD        Patient Profile:   John [name] is a 61 y.o. male with a hx of metastatic squamous cell carcinoma who is being seen 08/03/2021 for the evaluation of paroxysmal atrial fibrillation at the request of Dr. Posey Pronto.  History of Present Illness:   John [name] is a 61 year old male patient of Dr. Antonieta Pert for metastatic squamous cell carcinoma of the lung who earlier today developed rapid atrial fibrillation, heart rates in the 170s originally, now well controlled on diltiazem.  He has been on Eliquis for anticoagulation.  He has had history of paroxysmal atrial fibrillation originally diagnosed in March 2022.  At that time he was started on amiodarone and converted to normal sinus rhythm.  There was concerns about potential amiodarone lung toxicity on CT scan performed 07/18/2020.  Amiodarone was stopped.  He had recurrent atrial fibrillation during hospitalization in August 2022.  At that time he underwent successful cardioversion.  After discussion with pulmonary, amiodarone was resumed.  In January 2023 after possible pulmonary fibrosis once again seen on CT scan, [name] had discussion with Dr. Radford Pax and amiodarone was once again discontinued.  After this point, he had another admission on 2/23 for spontaneous pneumothorax with chest tube.   At an office visit in May 2023, he reported that amiodarone was stopped due to lung issues.  At home, takes Toprol 25 mg with an occasional increased dose for heart rates over 100.  Coronary artery calcification with aortic atherosclerosis identified on CT scan.  Zetia was prescribed but was not started because of fear of myalgias.  Prior authorization for Repatha was submitted.  In speaking with him today, he just came back  from radiation oncology for simulation.  I spoke to him about his atrial fibrillation and he is very eager to get back on the amiodarone to try to help him maintain sinus rhythm.  He is highly symptomatic with his atrial fibrillation.  He says he has been off of it for a little over 3 months.    Past Medical History:  Diagnosis Date   Atrial fibrillation with RVR ([place]) 05/12/2020   Dyspnea    Family history of adverse reaction to anesthesia    brother with seizures had episode under anesthesia.  Patient has seizures as well.   GERD (gastroesophageal reflux disease)    History of radiation therapy 04/24/2020-05/16/2020   IMRT to right lung     Dr [name]   PAF (paroxysmal atrial fibrillation) ([place])    CHADS2VSAC score 0   Rheumatoid aortitis    Sepsis ([place]) 10/06/2020   Squamous cell lung cancer [facility])     Past Surgical History:  Procedure Laterality Date   BRONCHIAL BRUSHINGS  03/27/2020   Procedure: BRONCHIAL BRUSHINGS;  Surgeon: [name], MD;  Location: [place];  Service: Cardiopulmonary;;   BUBBLE STUDY  10/13/2020   Procedure: BUBBLE STUDY;  Surgeon: Pixie Casino, MD;  Location: [place];  Service: Cardiovascular;;   CARDIOVERSION N/A 10/13/2020   Procedure: CARDIOVERSION;  Surgeon: Pixie Casino, MD;  Location: [facility] ENDOSCOPY;  Service: Cardiovascular;  Laterality: N/A;   CATARACT EXTRACTION  2016   at [place]  03/27/2020   Procedure: FINE NEEDLE ASPIRATION;  Surgeon: [name], MD;  Location: [facility] ENDOSCOPY;  Service: Cardiopulmonary;;   HIP SURGERY Left    TEE WITHOUT CARDIOVERSION N/A 10/13/2020   Procedure: TRANSESOPHAGEAL ECHOCARDIOGRAM (TEE);  Surgeon: Pixie Casino, MD;  Location: [place];  Service: Cardiovascular;  Laterality: N/A;   VIDEO BRONCHOSCOPY WITH ENDOBRONCHIAL ULTRASOUND N/A 03/27/2020   Procedure: VIDEO BRONCHOSCOPY WITH ENDOBRONCHIAL ULTRASOUND;  Surgeon: [name], MD;  Location: [place];   Service: Cardiopulmonary;  Laterality: N/A;     Home Medications:  Prior to Admission medications   Medication Sig Start Date End Date Taking? Authorizing Provider  acetaminophen (TYLENOL) 500 MG tablet Take 500-1,000 mg by mouth every 6 (six) hours as needed for mild pain.   Yes [provider]  apixaban (ELIQUIS) 5 MG TABS tablet Take 1 tablet (5 mg total) by mouth 2 (two) times daily. 01/05/21  Yes Fenton, Clint R, PA  benzonatate (TESSALON) 200 MG capsule Take 1 capsule (200 mg total) by mouth 3 (three) times daily as needed for cough. 07/23/21  Yes Martyn Ehrich, NP  metoprolol succinate (TOPROL XL) 25 MG 24 hr tablet Take one tablet by mouth daily. Take additional dose (25 mg) for increase HR over 100. Patient taking differently: Take 25 mg by mouth See admin instructions. Take 25 mg by mouth once a day and an additional 25 mg for an increased heart rate over 100 06/23/21  Yes Swinyer, Lanice Schwab, NP  PARoxetine (PAXIL) 10 MG tablet Take 10 mg by mouth at bedtime. 01/25/20  Yes [provider]  predniSONE (DELTASONE) 10 MG tablet Take 4 tablets (40 mg total) by mouth daily with breakfast for 21 days, THEN 3 tablets (30 mg total) daily with breakfast for 5 days, THEN 2 tablets (20 mg total) daily with breakfast for 5 days, THEN 1 tablet (10 mg total) daily with breakfast for 5 days. 07/26/21 08/31/21 Yes Martyn Ehrich, NP  PRILOSEC OTC 20 MG tablet Take 20 mg by mouth daily before breakfast. 11/13/19  Yes [provider]  Budeson-Glycopyrrol-Formoterol (BREZTRI AEROSPHERE) 160-9-4.8 MCG/ACT AERO Inhale 2 puffs into the lungs in the morning and at bedtime. Patient not taking: Reported on 08/01/2021 07/22/21   Martyn Ehrich, NP  ezetimibe (ZETIA) 10 MG tablet Take 1 tablet (10 mg total) by mouth daily. Patient not taking: Reported on 08/01/2021 06/23/21   Swinyer, Lanice Schwab, NP  prochlorperazine (COMPAZINE) 10 MG tablet Take 1 tablet (10 mg total) by mouth every 6  (six) hours as needed (Nausea or vomiting). Patient not taking: Reported on 06/29/2021 01/29/21 07/20/21  Volanda Napoleon, MD    Inpatient Medications: Scheduled Meds:  apixaban  5 mg Oral BID   diltiazem  180 mg Oral Daily   pantoprazole  40 mg Oral Daily   PARoxetine  10 mg Oral QHS   potassium chloride  40 mEq Oral Q2H   predniSONE  40 mg Oral Q breakfast   Continuous Infusions:  PRN Meds: acetaminophen **OR** acetaminophen, benzonatate, ondansetron **OR** ondansetron (ZOFRAN) IV, polyethylene glycol  Allergies:    Allergies  Allergen Reactions   Albuterol Other (See Comments)    Patient has had episode of atrial fib following administration of albuterol (tolerates Xopenex well)   Atorvastatin Other (See Comments)    Cannot take at this time    Social History:   Social History   Socioeconomic History   Marital status: Married    Spouse name: Not on file   Number of children: Not on file   Years of education:  Not on file   Highest education level: Not on file  Occupational History   Not on file  Tobacco Use   Smoking status: Former    Packs/day: 1.00    Years: 30.00    Total pack years: 30.00    Types: Cigarettes    Quit date: 2018    Years since quitting: 5.4   Smokeless tobacco: Never   Tobacco comments:    Former smoker 10/28/2020  Vaping Use   Vaping Use: Never used  Substance and Sexual Activity   Alcohol use: Not Currently   Drug use: Never   Sexual activity: Not on file  Other Topics Concern   Not on file  Social History Narrative   ** Merged History Encounter **       Social Determinants of Health   Financial Resource Strain: Not on file  Food Insecurity: Not on file  Transportation Needs: Not on file  Physical Activity: Not on file  Stress: Not on file  Social Connections: Not on file  Intimate Partner Violence: Not on file    Family History:    Family History  Problem Relation Age of Onset   Arrhythmia Mother        has PPM    Heart disease Father        Died at 78, started in his 42s, heart attacks, had PPM and ICD     ROS:  Please see the history of present illness.  No fever chills nausea vomiting All other ROS reviewed and negative.     Physical Exam/Data:   Vitals:   08/02/21 2052 08/03/21 0447 08/03/21 1007 08/03/21 1137  BP: 113/85 115/80 119/68 109/82  Pulse: 65 99 (!) 101 65  Resp: 18 18 20 18   Temp: 97.9 F (36.6 C) (!) 97.5 F (36.4 C) 97.7 F (36.5 C) 97.6 F (36.4 C)  TempSrc: Oral Oral Oral   SpO2: 99% 94% 98% 98%  Weight:      Height:        Intake/Output Summary (Last 24 hours) at 08/03/2021 1339 Last data filed at 08/03/2021 1025 Gross per 24 hour  Intake 600 ml  Output --  Net 600 ml      08/02/2021   10:05 AM 07/29/2021    7:37 AM 07/22/2021    3:36 PM  Last 3 Weights  Weight (lbs) 229 lb 14.4 oz 231 lb 4 oz 226 lb 12.8 oz  Weight (kg) 104.282 kg 104.894 kg 102.876 kg     Body mass index is 32.06 kg/m.  General:  Well nourished, well developed, in no acute distress HEENT: normal Neck: no JVD Vascular: No carotid bruits; Distal pulses 2+ bilaterally Cardiac:   irreg; no murmur  Lungs:  clear to auscultation bilaterally, no wheezing, rhonchi or rales  Abd: soft, nontender, no hepatomegaly  Ext: no edema Musculoskeletal:  No deformities, BUE and BLE strength normal and equal Skin: warm and dry  Neuro:  CNs 2-12 intact, no focal abnormalities noted Psych:  Normal affect   EKG:  The EKG was personally reviewed and demonstrates:  AFIB 93, LAFB  Telemetry:  Telemetry was personally reviewed and demonstrates:  AFIB RVR prior  Relevant CV Studies:  ECHO 08/02/21   1. Left ventricular ejection fraction, by estimation, is 60 to 65%. The  left ventricle has normal function. Left ventricular endocardial border  not optimally defined to evaluate regional wall motion even with definity  contrast. Left ventricular  diastolic function could not be evaluated.  2. Right  ventricular systolic function was not well visualized. The right  ventricular size is not well visualized. Tricuspid regurgitation signal is  inadequate for assessing PA pressure.   3. The mitral valve is normal in structure. No evidence of mitral valve  regurgitation. No evidence of mitral stenosis.   4. The aortic valve is normal in structure. Aortic valve regurgitation is  not visualized. No aortic stenosis is present.   5. Aortic dilatation noted. There is mild dilatation of the aortic root,  measuring 40 mm.   6. The inferior vena cava is normal in size with greater than 50%  respiratory variability, suggesting right atrial pressure of 3 mmHg.   Laboratory Data:  High Sensitivity Troponin:   Recent Labs  Lab 08/02/21 0654  TROPONINIHS 2     Chemistry Recent Labs  Lab 08/01/21 2301 08/03/21 0418  NA 139 141  K 3.3* 3.4*  CL 107 104  CO2 23 29  GLUCOSE 161* 116*  BUN 23* 18  CREATININE 1.03 0.88  CALCIUM 8.2* 8.5*  MG 2.2 2.3  GFRNONAA >60 >60  ANIONGAP 9 8    Recent Labs  Lab 08/03/21 0418  PROT 5.5*  ALBUMIN 2.8*  AST 12*  ALT 25  ALKPHOS 64  BILITOT 0.7   Lipids No results for input(s): "CHOL", "TRIG", "HDL", "LABVLDL", "LDLCALC", "CHOLHDL" in the last 168 hours.  Hematology Recent Labs  Lab 08/01/21 2301 08/03/21 0418  WBC 17.5* 11.3*  RBC 4.38 4.22  HGB 12.1* 11.6*  HCT 38.1* 37.4*  MCV 87.0 88.6  MCH 27.6 27.5  MCHC 31.8 31.0  RDW 19.4* 19.6*  PLT 197 164   Thyroid  Recent Labs  Lab 08/01/21 2301  TSH 1.582    BNP Recent Labs  Lab 08/01/21 2301  BNP 69.1    DDimer  Recent Labs  Lab 08/02/21 1050  DDIMER 3.61*     Radiology/Studies:  ECHOCARDIOGRAM COMPLETE  Result Date: 08/02/2021    ECHOCARDIOGRAM REPORT   Patient Name:   [facility] Date of Exam: 08/02/2021 Medical Rec #:  [id number]     Height:       71.0 in Accession #:    [id number]    Weight:       231.2 lb Date of Birth:  07-21-1960     BSA:          2.243 m Patient  Age:    40 years      BP:           97/72 mmHg Patient Gender: M             HR:           120 bpm. Exam Location:  Inpatient Procedure: 2D Echo, Cardiac Doppler, Color Doppler and Intracardiac            Opacification Agent Indications:    Atrial Fibrillation I48.91  History:        Patient has prior history of Echocardiogram examinations, most                 recent 10/13/2020. CAD, COPD, Arrythmias:Atrial Fibrillation;                 Risk Factors:Dyslipidemia and Former Smoker. Lung cancer.                 Drug-induced pneumonitis.  Sonographer:    Darlina Sicilian RDCS Referring Phys: [id number] [place]  1. Left ventricular ejection fraction, by estimation, is  60 to 65%. The left ventricle has normal function. Left ventricular endocardial border not optimally defined to evaluate regional wall motion even with definity contrast. Left ventricular diastolic function could not be evaluated.  2. Right ventricular systolic function was not well visualized. The right ventricular size is not well visualized. Tricuspid regurgitation signal is inadequate for assessing PA pressure.  3. The mitral valve is normal in structure. No evidence of mitral valve regurgitation. No evidence of mitral stenosis.  4. The aortic valve is normal in structure. Aortic valve regurgitation is not visualized. No aortic stenosis is present.  5. Aortic dilatation noted. There is mild dilatation of the aortic root, measuring 40 mm.  6. The inferior vena cava is normal in size with greater than 50% respiratory variability, suggesting right atrial pressure of 3 mmHg. FINDINGS  Left Ventricle: Left ventricular ejection fraction, by estimation, is 60 to 65%. The left ventricle has normal function. Left ventricular endocardial border not optimally defined to evaluate regional wall motion. Definity contrast agent was given IV to delineate the left ventricular endocardial borders. The left ventricular internal cavity size was normal in  size. There is no left ventricular hypertrophy. Left ventricular diastolic function could not be evaluated due to atrial fibrillation. Left ventricular diastolic function could not be evaluated. Right Ventricle: The right ventricular size is not well visualized. Right vetricular wall thickness was not assessed. Right ventricular systolic function was not well visualized. Tricuspid regurgitation signal is inadequate for assessing PA pressure. Left Atrium: Left atrial size was not well visualized. Right Atrium: Right atrial size was not well visualized. Pericardium: There is no evidence of pericardial effusion. Mitral Valve: The mitral valve is normal in structure. No evidence of mitral valve regurgitation. No evidence of mitral valve stenosis. Tricuspid Valve: The tricuspid valve is normal in structure. Tricuspid valve regurgitation is not demonstrated. No evidence of tricuspid stenosis. Aortic Valve: The aortic valve is normal in structure. Aortic valve regurgitation is not visualized. No aortic stenosis is present. Pulmonic Valve: The pulmonic valve was normal in structure. Pulmonic valve regurgitation is not visualized. No evidence of pulmonic stenosis. Aorta: Aortic dilatation noted. There is mild dilatation of the aortic root, measuring 40 mm. Venous: The inferior vena cava is normal in size with greater than 50% respiratory variability, suggesting right atrial pressure of 3 mmHg. IAS/Shunts: No atrial level shunt detected by color flow Doppler.  LEFT VENTRICLE PLAX 2D LVIDd:         5.20 cm LVIDs:         3.50 cm LV PW:         1.20 cm LV IVS:        1.10 cm LVOT diam:     2.30 cm LVOT Area:     4.15 cm  LEFT ATRIUM         Index LA diam:    3.80 cm 1.69 cm/m   AORTA Ao Root diam: 4.00 cm MITRAL VALVE MV Area (PHT): 3.70 cm    SHUNTS MV Decel Time: 205 msec    Systemic Diam: 2.30 cm MV E velocity: 69.40 cm/s Fransico Him MD Electronically signed by Fransico Him MD Signature Date/Time: 08/02/2021/12:50:28 PM     Final    DG Chest Port 1 View  Result Date: 08/02/2021 CLINICAL DATA:  Heart palpitations today.  History of lung cancer. EXAM: PORTABLE CHEST 1 VIEW COMPARISON:  07/20/2021 FINDINGS: Heart size and pulmonary vascularity are normal. Right hilar mass similar to prior study likely corresponding to known  cancer and/or treatment changes. Probable pleural thickening on the right. Left lung is clear. Probable emphysematous changes. IMPRESSION: Stable appearance of the chest since previous study. Right perihilar mass corresponding to known cancer in treatment changes. Right pleural thickening. Electronically Signed   By: Lucienne Capers M.D.   On: 08/02/2021 02:08     Assessment and Plan:   61 year old with rapid atrial fibrillation in the setting of metastatic non-small cell lung cancer.  Paroxysmal atrial fibrillation -Earlier today at approximately 10 AM he started feeling his heart racing and felt dizzy after getting back into bed from the chair.  His heart rate showed 170 bpm in atrial fibrillation on monitor.  An EKG performed then showed heart rates of 90s to 120s after he had rested in bed. -Cardizem CD180 milligrams was started and helped transiently.  -In talking with him today, he would really like to try to maintain sinus rhythm.  I discussed with him the amiodarone again and he would really like to get back on this medication.  He felt much better when his heart was in sinus.  We had lengthy discussion about potential lung toxicity from amiodarone.  He understands this potential risk.  Nonetheless, he understands that Beryle Flock could have also caused some evidence of pulmonary fibrosis.  In his own words, he states he has stage IV lung cancer and he would really like to be back on the amiodarone which controlled his atrial fibrillation.  I will go ahead and start IV amiodarone load.  We will convert to p.o. later. - He also wanted to get back on his Toprol instead of Cardizem.  This is very  reasonable.  We will place him back on Toprol XL 25 mg a day.  We can increase dosage if necessary. -He has been on Eliquis as outpatient.  Continue as anticoagulation.  Watch for any signs of bleeding. -Echocardiogram reassuring.   Metastatic squamous cell carcinoma of the lung - He is to have radiation therapy for left adrenal mass.  Has been on steroid taper because of pneumonitis.  He is not currently on chemotherapy.  Goal is for palliative treatment with radiation.    Risk Assessment/Risk Scores:        CHA2DS2-VASc Score = 3   This indicates a 3.2% annual risk of stroke. The patient's score is based upon: CHF History: 0 HTN History: 0 Diabetes History: 0 Stroke History: 2 (possible spenic embolic event) Vascular Disease History: 1 Age Score: 0 Gender Score: 0     For questions or updates, please contact [place] Please consult [email or web address] for contact info under    Signed, Candee Furbish, MD  08/03/2021 1:39 PM

## 2021-08-03 NOTE — Progress Notes (Signed)
  Transition of Care [facility]) Screening Note   Patient Details  Name: Benjamyn Hestand Date of Birth: 1960/12/12   Transition of Care [facility]) CM/SW Contact:    Dessa Phi, RN Phone Number: 08/03/2021, 3:53 PM    Transition of Care Department [facility]) has reviewed patient and no TOC needs have been identified at this time. We will continue to monitor patient advancement through interdisciplinary progression rounds. If new patient transition needs arise, please place a TOC consult.

## 2021-08-03 NOTE — Progress Notes (Signed)
Patient called RN to bedside for c/o "heart racing." Pt c/o feeling "dizzy" after getting back to bed from chair. HR showing 170s A-Fib on monitor. VSS, EKG done showing HR 90s-120s after patient had rested in bed several moments. Will notify MD and continue to monitor.     08/03/21 1007  Vitals  Temp 97.7 F (36.5 C)  Temp Source Oral  BP 119/68  MAP (mmHg) 84  BP Location Left Arm  BP Method Automatic  Patient Position (if appropriate) Lying  Pulse Rate (!) 101  Pulse Rate Source Monitor  Resp 20  MEWS COLOR  MEWS Score Color Green  Oxygen Therapy  SpO2 98 %  O2 Device Nasal Cannula  O2 Flow Rate (L/min) 4 L/min  MEWS Score  MEWS Temp 0  MEWS Systolic 0  MEWS Pulse 1  MEWS RR 0  MEWS LOC 0  MEWS Score 1

## 2021-08-03 NOTE — Progress Notes (Signed)
  Amiodarone Drug - Drug Interaction Consult Note  Recommendations:  Monitor for myocardial depression (currently on Telemetry)  Amiodarone is metabolized by the cytochrome P450 system and therefore has the potential to cause many drug interactions. Amiodarone has an average plasma half-life of 50 days (range 20 to 100 days).   There is potential for drug interactions to occur several weeks or months after stopping treatment and the onset of drug interactions may be slow after initiating amiodarone.   []  Statins: Increased risk of myopathy. Simvastatin- restrict dose to 20mg  daily. Other statins: counsel patients to report any muscle pain or weakness immediately.  []  Anticoagulants: Amiodarone can increase anticoagulant effect. Consider warfarin dose reduction. Patients should be monitored closely and the dose of anticoagulant altered accordingly, remembering that amiodarone levels take several weeks to stabilize.  []  Antiepileptics: Amiodarone can increase plasma concentration of phenytoin, the dose should be reduced. Note that small changes in phenytoin dose can result in large changes in levels. Monitor patient and counsel on signs of toxicity.  [x]  Beta blockers: increased risk of bradycardia, AV block and myocardial depression. Sotalol - avoid concomitant use.  []   Calcium channel blockers (diltiazem and verapamil): increased risk of bradycardia, AV block and myocardial depression.  []   Cyclosporine: Amiodarone increases levels of cyclosporine. Reduced dose of cyclosporine is recommended.  []  Digoxin dose should be halved when amiodarone is started.  []  Diuretics: increased risk of cardiotoxicity if hypokalemia occurs.  []  Oral hypoglycemic agents (glyburide, glipizide, glimepiride): increased risk of hypoglycemia. Patient's glucose levels should be monitored closely when initiating amiodarone therapy.   []  Drugs that prolong the QT interval:  Torsades de pointes risk may be  increased with concurrent use - avoid if possible.  Monitor QTc, also keep magnesium/potassium WNL if concurrent therapy can't be avoided.  Antibiotics: e.g. fluoroquinolones, erythromycin.  Antiarrhythmics: e.g. quinidine, procainamide, disopyramide, sotalol.  Antipsychotics: e.g. phenothiazines, haloperidol.   Lithium, tricyclic antidepressants, and methadone. Thank You,  La Dibella A  08/03/2021 4:16 PM

## 2021-08-03 NOTE — Hospital Course (Signed)
Past medical history of stage IV squamous cell lung cancer currently on chemotherapy and radiation, CAD, pulmonary fibrosis, COPD, arthritis, anemia,, obesity, on chronic steroids, chronic respiratory failure on 4 LPM at home presents to the hospital with complaints of palpitation and found to have A-fib with RVR. Cardiology consulted due to uncontrolled heart rates.[name]

## 2021-08-04 DIAGNOSIS — I482 Chronic atrial fibrillation, unspecified: Secondary | ICD-10-CM | POA: Diagnosis not present

## 2021-08-04 LAB — COMPREHENSIVE METABOLIC PANEL
ALT: 21 U/L (ref 0–44)
AST: 12 U/L — ABNORMAL LOW (ref 15–41)
Albumin: 2.8 g/dL — ABNORMAL LOW (ref 3.5–5.0)
Alkaline Phosphatase: 62 U/L (ref 38–126)
Anion gap: 8 (ref 5–15)
BUN: 21 mg/dL — ABNORMAL HIGH (ref 6–20)
CO2: 25 mmol/L (ref 22–32)
Calcium: 8.4 mg/dL — ABNORMAL LOW (ref 8.9–10.3)
Chloride: 105 mmol/L (ref 98–111)
Creatinine, Ser: 0.85 mg/dL (ref 0.61–1.24)
GFR, Estimated: >60 mL/min (ref >60)
Glucose, Bld: 130 mg/dL — ABNORMAL HIGH (ref 70–99)
Potassium: 3.9 mmol/L (ref 3.5–5.1)
Sodium: 138 mmol/L (ref 135–145)
Total Bilirubin: 0.7 mg/dL (ref 0.3–1.2)
Total Protein: 5.6 g/dL — ABNORMAL LOW (ref 6.5–8.1)

## 2021-08-04 LAB — CBC WITH DIFFERENTIAL/PLATELET
Abs Immature Granulocytes: 0.13 10*3/uL — ABNORMAL HIGH (ref 0.00–0.07)
Basophils Absolute: 0 10*3/uL (ref 0.0–0.1)
Basophils Relative: 0 %
Eosinophils Absolute: 0.1 10*3/uL (ref 0.0–0.5)
Eosinophils Relative: 1 %
HCT: 36.4 % — ABNORMAL LOW (ref 39.0–52.0)
Hemoglobin: 11.7 g/dL — ABNORMAL LOW (ref 13.0–17.0)
Immature Granulocytes: 1 %
Lymphocytes Relative: 6 %
Lymphs Abs: 0.8 10*3/uL (ref 0.7–4.0)
MCH: 28.1 pg (ref 26.0–34.0)
MCHC: 32.1 g/dL (ref 30.0–36.0)
MCV: 87.3 fL (ref 80.0–100.0)
Monocytes Absolute: 0.8 10*3/uL (ref 0.1–1.0)
Monocytes Relative: 7 %
Neutro Abs: 10.1 10*3/uL — ABNORMAL HIGH (ref 1.7–7.7)
Neutrophils Relative %: 85 %
Platelets: 169 10*3/uL (ref 150–400)
RBC: 4.17 MIL/uL — ABNORMAL LOW (ref 4.22–5.81)
RDW: 19.5 % — ABNORMAL HIGH (ref 11.5–15.5)
WBC: 11.9 10*3/uL — ABNORMAL HIGH (ref 4.0–10.5)
nRBC: 0 % (ref 0.0–0.2)

## 2021-08-04 LAB — PREALBUMIN: Prealbumin: 28.7 mg/dL (ref 18–38)

## 2021-08-04 MED ORDER — METOPROLOL SUCCINATE ER 25 MG PO TB24
37.5000 mg | ORAL_TABLET | Freq: Every day | ORAL | Status: DC
Start: 2021-08-05 — End: 2021-08-05
  Administered 2021-08-05: 37.5 mg via ORAL
  Filled 2021-08-04: qty 2

## 2021-08-04 NOTE — Progress Notes (Addendum)
Progress Note  Patient Name: John [name] Date of Encounter: 08/04/2021  [facility] Cardiologist: Fransico Him, MD   Subjective   Patient reports some SOB, states that its no worse than usual. Denies any chest pain, palpitations, weakness, dizziness.   HR is in the 100s when at rest, does increase to the 130s when he walks to the bathroom   Inpatient Medications    Scheduled Meds:  apixaban  5 mg Oral BID   metoprolol succinate  25 mg Oral Daily   pantoprazole  40 mg Oral Daily   PARoxetine  10 mg Oral QHS   predniSONE  40 mg Oral Q breakfast   Continuous Infusions:  amiodarone 30 mg/hr (08/04/21 0456)   PRN Meds: acetaminophen **OR** acetaminophen, benzonatate, ondansetron **OR** ondansetron (ZOFRAN) IV, polyethylene glycol   Vital Signs    Vitals:   08/04/21 0442 08/04/21 1021 08/04/21 1023 08/04/21 1202  BP: 104/65 (!) 88/68 110/73 111/79  Pulse: 79 99 (!) 101 60  Resp: 16 17 18 20   Temp: 98 F (36.7 C) 98.2 F (36.8 C) 98.2 F (36.8 C) 97.8 F (36.6 C)  TempSrc: Oral Oral Oral Oral  SpO2: 93% 93% 95% 94%  Weight:      Height:        Intake/Output Summary (Last 24 hours) at 08/04/2021 1352 Last data filed at 08/03/2021 1930 Gross per 24 hour  Intake 120 ml  Output --  Net 120 ml      08/02/2021   10:05 AM 07/29/2021    7:37 AM 07/22/2021    3:36 PM  Last 3 Weights  Weight (lbs) 229 lb 14.4 oz 231 lb 4 oz 226 lb 12.8 oz  Weight (kg) 104.282 kg 104.894 kg 102.876 kg      Telemetry    Atrial fibrillation, HR in the 90s-140s - Personally Reviewed  ECG    No new tracings  - Personally Reviewed  Physical Exam   GEN: No acute distress. Sitting in the chair wearing [place]  Neck: No JVD Cardiac: Irregular rate and rhythm, tachycardic  Respiratory: Clear to auscultation bilaterally. GI: Soft, nontender, non-distended  MS: No edema; No deformity. Extremities warm and well perfused  Neuro:  Nonfocal  Psych: Normal affect   Labs    High  Sensitivity Troponin:   Recent Labs  Lab 08/02/21 0654  TROPONINIHS 2     Chemistry Recent Labs  Lab 08/01/21 2301 08/03/21 0418 08/04/21 0451  NA 139 141 138  K 3.3* 3.4* 3.9  CL 107 104 105  CO2 23 29 25   GLUCOSE 161* 116* 130*  BUN 23* 18 21*  CREATININE 1.03 0.88 0.85  CALCIUM 8.2* 8.5* 8.4*  MG 2.2 2.3  --   PROT  --  5.5* 5.6*  ALBUMIN  --  2.8* 2.8*  AST  --  12* 12*  ALT  --  25 21  ALKPHOS  --  64 62  BILITOT  --  0.7 0.7  GFRNONAA >60 >60 >60  ANIONGAP 9 8 8     Lipids No results for input(s): "CHOL", "TRIG", "HDL", "LABVLDL", "LDLCALC", "CHOLHDL" in the last 168 hours.  Hematology Recent Labs  Lab 08/01/21 2301 08/03/21 0418 08/04/21 0451  WBC 17.5* 11.3* 11.9*  RBC 4.38 4.22 4.17*  HGB 12.1* 11.6* 11.7*  HCT 38.1* 37.4* 36.4*  MCV 87.0 88.6 87.3  MCH 27.6 27.5 28.1  MCHC 31.8 31.0 32.1  RDW 19.4* 19.6* 19.5*  PLT 197 164 169   Thyroid  Recent  Labs  Lab 08/01/21 2301  TSH 1.582    BNP Recent Labs  Lab 08/01/21 2301  BNP 69.1    DDimer  Recent Labs  Lab 08/02/21 1050  DDIMER 3.61*     Radiology    No results found.  Cardiac Studies   Echocardiogram 08/02/21   1. Left ventricular ejection fraction, by estimation, is 60 to 65%. The  left ventricle has normal function. Left ventricular endocardial border  not optimally defined to evaluate regional wall motion even with definity  contrast. Left ventricular  diastolic function could not be evaluated.   2. Right ventricular systolic function was not well visualized. The right  ventricular size is not well visualized. Tricuspid regurgitation signal is  inadequate for assessing PA pressure.   3. The mitral valve is normal in structure. No evidence of mitral valve  regurgitation. No evidence of mitral stenosis.   4. The aortic valve is normal in structure. Aortic valve regurgitation is  not visualized. No aortic stenosis is present.   5. Aortic dilatation noted. There is mild  dilatation of the aortic root,  measuring 40 mm.   6. The inferior vena cava is normal in size with greater than 50%  respiratory variability, suggesting right atrial pressure of 3 mmHg.   Patient Profile     61 y.o. male with rapid atrial fibrillation in the setting of metastatic non-small cell lung cancer  Assessment & Plan      Paroxysmal atrial fibrillation - Yesterday AM-- he started feeling his heart racing and felt dizzy after getting back into bed from the chair.  His heart rate showed 170 bpm in atrial fibrillation on monitor.  An EKG performed then showed heart rates of 90s to 120s after he had rested in bed. - Attempted to start cardizem, not much improvement  -- Dr. Marlou Porch saw patient-- had a long conversation about amiodarone and possible lung toxicity. Patient voiced understanding but really wanted to maintain sinus rhythm and go back on amiodarone  -- Started IV amiodarone yesterday.  -- Transitioned from cardizem to toprol XL 25 mg daily. Attempting to increase metoprolol to 37.5 mg daily to attempt some better HR control -- May eventually need cardioversion if his HR continues to be difficult to control  -He has been on Eliquis as outpatient.  Continue as anticoagulation.  Watch for any signs of bleeding. -Echocardiogram reassuring.     Metastatic squamous cell carcinoma of the lung - He is to have radiation therapy for left adrenal mass.  Has been on steroid taper because of pneumonitis.  He is not currently on chemotherapy.  Goal is for palliative treatment with radiation.        For questions or updates, please contact [place] Please consult [email or web address] for contact info under        Signed, Margie Billet, PA-C  08/04/2021, 1:52 PM    Personally seen and examined. Agree with above.  61 year old with rapid atrial fibrillation, prior amiodarone use with rhythm control, metastatic squamous cell carcinoma of the lung.  Rapid atrial  fibrillation - Now back on IV amiodarone.  See original consult note for full details.  Continue with load. - Gentle increase in Toprol-XL 25 mg to 37.5 mg once a day  Previously after loading with amiodarone he chemically converted at home.  Hopefully this will happen once again.  Candee Furbish, MD

## 2021-08-04 NOTE — Progress Notes (Signed)
John [name] is doing okay.  He is on amiodarone drip now.  He has not had rapid A-fib yesterday.  He felt short of breath.  Has the palpitations.  He also had simulation for radiation therapy.  However, he tells me that he is not can start radiation for another couple weeks.  This really makes no sense to me.  I would think that he would start radiation therapy today.  I will have to talk to Dr. [name] about this.  He is having no problems with nausea or vomiting.  He is having no issues with bowels or bladder.  He has had no bleeding.  He has had no pain issues.  His white cell count is 11.9.  Hemoglobin 11.7.  His platelet count is 169,000.  His albumin is 2.8.  The BUN is 21 creatinine 0.85.  Potassium 3.9.  Vital signs are temperature 98.  Pulse 79.  Blood pressure 104/65.  His lungs sound like some wheezes bilaterally.  He has good air movement.  Cardiac exam is irregular rate and rhythm consistent with atrial fibrillation.  Abdomen is soft.  Bowel sounds are present.  There may be a little bit of tenderness over on the left side.  There is no hepatomegaly.  Extremity shows some slight edema in the lower legs.  Neurological exam is nonfocal.  Again, [name] is admitted for the rapid atrial fibrillation.  Cardiology is following him.  They are helping to manage this.  Again we have to see about the radiation.  I would think that he would start radiation therapy today.  We will follow along as always.  For right now, there really is not much for [place] to do right now.    [name], MD  Romans 8:31

## 2021-08-04 NOTE — Progress Notes (Signed)
Progress Note Patient: John [name] DTO:[id number] DOB: 07/08/1960 DOA: 08/01/2021  DOS: the patient was seen and examined on 08/04/2021  Brief hospital course: Past medical history of stage IV squamous cell lung cancer currently on chemotherapy and radiation, CAD, pulmonary fibrosis, COPD, arthritis, anemia,, obesity, on chronic steroids, chronic respiratory failure on 4 LPM at home presents to the hospital with complaints of palpitation and found to have A-fib with RVR. Cardiology consulted due to uncontrolled heart rates..  Assessment and Plan:   Paroxysmal atrial fibrillation with RVR ([place]) At home on Toprol-XL. Presents with complaints of palpitation. Found to have A-fib with a heart rate 150. Initially started on IV Cardizem infusion. Echocardiogram performed shows preserved EF. Patient was transitioned to oral Cardizem but heart rate remained elevated therefore cardiology was consulted. Cardizem discontinued due to lack of effectiveness and patient preference. Patient is now on IV amiodarone infusion along with oral metoprolol. Chronically on anticoagulation. Maintain K more than 4, mag more than 2. Continue to monitor.    Drug-induced pneumonitis Most likely due to [facility]. Oxygenation improving. Continue prednisone taper.    Hypokalemia Potassium being replaced. Monitor.    COPD with asthma ([place]) No evidence of exacerbation. Continue inhalers. Monitor.    Coronary artery disease involving native coronary artery of native heart without angina pectoris No evidence of acute ischemia. Echocardiogram shows preserved EF. Monitor for now.    Stage 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 [facility]) Adrenal metastatic lesion. Appreciate oncology consultation. Patient has seen radiation oncology outpatient for adrenal lesion radiation. Patient has severe pain from the lesion limiting his mobility. Currently awaiting insurance authorization. Management per oncology and  radiation oncology.  Right lower leg edema. Significant swelling of the right leg. While the patient is on chronic anticoagulation his mobility is limited due to his back pain. And his risk for having a DVT is high due to his cancer diagnosis. We will check Doppler to rule out any acute abnormality.    Mixed hyperlipidemia Continue statin.    Obesity Body mass index is 32.06 kg/m.  Placing the pt at higher risk of poor outcomes.    H/O rheumatoid arthritis Used to be on Simponi.  Currently not on any therapy.  Subjective: Reports intermittent back pain. Reports some dizziness and lightheadedness no nausea no vomiting.  No chest pain or chest tightness.  No fever no chills.  Continues to have chronic cough.  Physical Exam: Vitals:   08/04/21 0442 08/04/21 1021 08/04/21 1023 08/04/21 1202  BP: 104/65 (!) 88/68 110/73 111/79  Pulse: 79 99 (!) 101 60  Resp: 16 17 18 20   Temp: 98 F (36.7 C) 98.2 F (36.8 C) 98.2 F (36.8 C) 97.8 F (36.6 C)  TempSrc: Oral Oral Oral Oral  SpO2: 93% 93% 95% 94%  Weight:      Height:       General: Appear in mild distress; no visible Abnormal Neck Mass Or lumps, Conjunctiva normal Cardiovascular: S1 and S2 Present, no Murmur, Respiratory: good respiratory effort, Bilateral Air entry present and bilateral Crackles, no wheezes Abdomen: Bowel Sound present, Non tender  Extremities: Right pedal edema Neurology: alert and oriented to time, place, and person  Gait not checked due to patient safety concerns   Data Reviewed: I have Reviewed nursing notes, Vitals, and Lab results since pt's last encounter. Pertinent lab results CBC and BMP I have ordered test including CBC and BMP I have reviewed the last note from oncology and cardiology,    Family Communication:  Wife at bedside  Disposition: Status is: Inpatient Remains inpatient appropriate because: Heart rate still uncontrol.  Specifically on exertion.  Author: Berle Mull,  MD 08/04/2021 7:12 PM  Please look on [email or web address] to find out who is on call.

## 2021-08-05 ENCOUNTER — Inpatient Hospital Stay (HOSPITAL_COMMUNITY): Payer: 59

## 2021-08-05 DIAGNOSIS — I480 Paroxysmal atrial fibrillation: Secondary | ICD-10-CM

## 2021-08-05 DIAGNOSIS — R609 Edema, unspecified: Secondary | ICD-10-CM

## 2021-08-05 DIAGNOSIS — I4891 Unspecified atrial fibrillation: Secondary | ICD-10-CM | POA: Diagnosis not present

## 2021-08-05 LAB — BASIC METABOLIC PANEL
Anion gap: 8 (ref 5–15)
BUN: 21 mg/dL — ABNORMAL HIGH (ref 6–20)
CO2: 27 mmol/L (ref 22–32)
Calcium: 8.3 mg/dL — ABNORMAL LOW (ref 8.9–10.3)
Chloride: 105 mmol/L (ref 98–111)
Creatinine, Ser: 0.94 mg/dL (ref 0.61–1.24)
GFR, Estimated: >60 mL/min (ref >60)
Glucose, Bld: 88 mg/dL (ref 70–99)
Potassium: 3.4 mmol/L — ABNORMAL LOW (ref 3.5–5.1)
Sodium: 140 mmol/L (ref 135–145)

## 2021-08-05 LAB — CBC
HCT: 35.8 % — ABNORMAL LOW (ref 39.0–52.0)
Hemoglobin: 11.9 g/dL — ABNORMAL LOW (ref 13.0–17.0)
MCH: 28.7 pg (ref 26.0–34.0)
MCHC: 33.2 g/dL (ref 30.0–36.0)
MCV: 86.3 fL (ref 80.0–100.0)
Platelets: 158 10*3/uL (ref 150–400)
RBC: 4.15 MIL/uL — ABNORMAL LOW (ref 4.22–5.81)
RDW: 19.4 % — ABNORMAL HIGH (ref 11.5–15.5)
WBC: 10.4 10*3/uL (ref 4.0–10.5)
nRBC: 0 % (ref 0.0–0.2)

## 2021-08-05 LAB — MAGNESIUM: Magnesium: 2 mg/dL (ref 1.7–2.4)

## 2021-08-05 MED ORDER — METOPROLOL SUCCINATE ER 50 MG PO TB24
50.0000 mg | ORAL_TABLET | Freq: Every day | ORAL | Status: DC
  Administered 2021-08-06 – 2021-08-07 (×2): 50 mg via ORAL
  Filled 2021-08-05 (×2): qty 1

## 2021-08-05 MED ORDER — POTASSIUM CHLORIDE CRYS ER 20 MEQ PO TBCR
20.0000 meq | EXTENDED_RELEASE_TABLET | Freq: Once | ORAL | Status: AC
  Administered 2021-08-05: 20 meq via ORAL
  Filled 2021-08-05: qty 1

## 2021-08-05 MED ORDER — POTASSIUM CHLORIDE CRYS ER 20 MEQ PO TBCR: 20.0000 meq | EXTENDED_RELEASE_TABLET | Freq: Once | ORAL | Status: DC

## 2021-08-05 MED ORDER — ACETAMINOPHEN 325 MG PO TABS
325.0000 mg | ORAL_TABLET | Freq: Four times a day (QID) | ORAL | Status: DC | PRN
  Administered 2021-08-06 – 2021-08-07 (×2): 325 mg via ORAL
  Filled 2021-08-05: qty 1

## 2021-08-05 MED ORDER — ORAL CARE MOUTH RINSE: 15.0000 mL | OROMUCOSAL | Status: DC | PRN

## 2021-08-05 MED ORDER — METOPROLOL SUCCINATE ER 25 MG PO TB24
25.0000 mg | ORAL_TABLET | Freq: Every day | ORAL | Status: DC
  Administered 2021-08-05: 25 mg via ORAL
  Filled 2021-08-05: qty 1

## 2021-08-05 MED ORDER — ACETAMINOPHEN 650 MG RE SUPP: 650.0000 mg | Freq: Four times a day (QID) | RECTAL | Status: DC | PRN

## 2021-08-05 NOTE — Progress Notes (Signed)
PROGRESS NOTE    John [name]  FUX:[id number] DOB: September 05, 1960 (Age: 61) DOA: 08/01/2021 PCP: [name], MD   Brief Narrative:  Past medical history of stage IV squamous cell lung cancer currently on chemotherapy and radiation, CAD, pulmonary fibrosis, COPD, arthritis, anemia,, obesity, on chronic steroids, chronic respiratory failure on 4 LPM at home presents to the hospital with complaints of palpitation and found to have A-fib with RVR. Cardiology consulted due to uncontrolled heart rates..  Assessment & Plan:   Principal Problem:   Paroxysmal atrial fibrillation with RVR (HCC) Active Problems:   Drug-induced pneumonitis   Hypokalemia   COPD with asthma ([place])   Coronary artery disease involving native coronary artery of native heart without angina pectoris   Stage 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 (HCC)   Mixed hyperlipidemia   Obesity   H/O rheumatoid arthritis  Paroxysmal atrial fibrillation with RVR ([place]) At home on Toprol-XL. Presents with complaints of palpitation. Found to have A-fib with a heart rate 150. Initially started on IV Cardizem infusion. Echocardiogram performed shows preserved EF. Patient was transitioned to oral Cardizem but heart rate remained elevated therefore cardiology was consulted. Cardizem discontinued due to lack of effectiveness and patient preference. Patient is now on IV amiodarone infusion along with oral metoprolol. Chronically on anticoagulation/Eliquis.  Remains elevated this morning, metoprolol increased.  Cardiology managing.  Appreciate their help.   Drug-induced pneumonitis Most likely due to [facility]. Oxygenation improving. Continue prednisone taper.  He is on room air.    Hypokalemia: replaced  COPD with asthma ([place]) No evidence of exacerbation. Continue inhalers. Monitor.   Coronary artery disease involving native coronary artery of native heart without angina pectoris No evidence of acute ischemia. Echocardiogram  shows preserved EF. Monitor for now.   Stage 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 [facility]) Adrenal metastatic lesion. Appreciate oncology consultation. Patient has seen radiation oncology outpatient for adrenal lesion radiation. Patient has severe pain from the lesion limiting his mobility. Currently awaiting insurance authorization. Management per oncology and radiation oncology.   Right lower leg edema. Significant swelling of the right leg.  DVT ruled out.    Mixed hyperlipidemia Continue statin.     Obesity Body mass index is 32.06 kg/m.  Placing the pt at higher risk of poor outcomes.     H/O rheumatoid arthritis Used to be on Simponi.  Currently not on any therapy.  DVT prophylaxis:    Code Status: Full Code  Family Communication:  None present at bedside.  Plan of care discussed with patient in length and he/she verbalized understanding and agreed with it.  Status is: Inpatient Remains inpatient appropriate because: Still with atrial fibrillation with RVR   Estimated body mass index is 60 kg/m as calculated from the following: 32.06 kg/m as calculated from the following:   Height as of this encounter: 5\' 11"  (1.803 m).   Weight as of this encounter: 104.3 kg.    Nutritional Assessment: Body mass index is 32.06 kg/m.[name] Seen by dietician.  I agree with the assessment and plan as outlined below: Nutrition Status:        . Skin Assessment: I have examined the patient's skin and I agree with the wound assessment as performed by the wound care RN as outlined below:    Consultants:  Cardiology Oncology  Procedures:  None  Antimicrobials:  Anti-infectives (From admission, onward)    None         Subjective: Patient seen and examined.  His heart rate was around 160 when I saw him but despite  of that, he denied having any chest pain, shortness of breath or palpitation.  Objective: Vitals:   08/05/21 0813 08/05/21 1100 08/05/21 1307 08/05/21 1419  BP: 118/84 106/70 98/74 99/65   Pulse:    (!) 143 71  Resp:   20 (!) 24  Temp:   98 F (36.7 C) 98.3 F (36.8 C)  TempSrc:   Oral Oral  SpO2: 99%  98% 93%  Weight:      Height:        Intake/Output Summary (Last 24 hours) at 08/05/2021 1420 Last data filed at 08/05/2021 0700 Gross per 24 hour  Intake 902.75 ml  Output --  Net 902.75 ml   Filed Weights   08/02/21 1005  Weight: 104.3 kg    Examination:  General exam: Appears calm and comfortable  Respiratory system: Clear to auscultation. Respiratory effort normal. Cardiovascular system: S1 & S2 heard, irregularly irregular rate and rhythm. No JVD, murmurs, rubs, gallops or clicks.  Right lower extremity edema. Gastrointestinal system: Abdomen is nondistended, soft and nontender. No organomegaly or masses felt. Normal bowel sounds heard. Central nervous system: Alert and oriented. No focal neurological deficits. Extremities: Symmetric 5 x 5 power. Skin: No rashes, lesions or ulcers Psychiatry: Judgement and insight appear normal. Mood & affect appropriate.    Data Reviewed: I have personally reviewed following labs and imaging studies  CBC: Recent Labs  Lab 08/01/21 2301 08/03/21 0418 08/04/21 0451 08/05/21 0542  WBC 17.5* 11.3* 11.9* 10.4  NEUTROABS  --  9.2* 10.1*  --   HGB 12.1* 11.6* 11.7* 11.9*  HCT 38.1* 37.4* 36.4* 35.8*  MCV 87.0 88.6 87.3 86.3  PLT 197 164 169 409   Basic Metabolic Panel: Recent Labs  Lab 08/01/21 2301 08/03/21 0418 08/04/21 0451 08/05/21 0542  NA 139 141 138 140  K 3.3* 3.4* 3.9 3.4*  CL 107 104 105 105  CO2 23 29 25 27   GLUCOSE 161* 116* 130* 88  BUN 23* 18 21* 21*  CREATININE 1.03 0.88 0.85 0.94  CALCIUM 8.2* 8.5* 8.4* 8.3*  MG 2.2 2.3  --  2.0   GFR: Estimated Creatinine Clearance: 104 mL/min (by C-G formula based on SCr of 0.94 mg/dL). Liver Function Tests: Recent Labs  Lab 08/03/21 0418 08/04/21 0451  AST 12* 12*  ALT 25 21  ALKPHOS 64 62  BILITOT 0.7 0.7  PROT 5.5* 5.6*  ALBUMIN 2.8* 2.8*   No  results for input(s): "LIPASE", "AMYLASE" in the last 168 hours. No results for input(s): "AMMONIA" in the last 168 hours. Coagulation Profile: No results for input(s): "INR", "PROTIME" in the last 168 hours. Cardiac Enzymes: No results for input(s): "CKTOTAL", "CKMB", "CKMBINDEX", "TROPONINI" in the last 168 hours. BNP (last 3 results) No results for input(s): "PROBNP" in the last 8760 hours. HbA1C: No results for input(s): "HGBA1C" in the last 72 hours. CBG: No results for input(s): "GLUCAP" in the last 168 hours. Lipid Profile: No results for input(s): "CHOL", "HDL", "LDLCALC", "TRIG", "CHOLHDL", "LDLDIRECT" in the last 72 hours. Thyroid Function Tests: No results for input(s): "TSH", "T4TOTAL", "FREET4", "T3FREE", "THYROIDAB" in the last 72 hours. Anemia Panel: No results for input(s): "VITAMINB12", "FOLATE", "FERRITIN", "TIBC", "IRON", "RETICCTPCT" in the last 72 hours. Sepsis Labs: No results for input(s): "PROCALCITON", "LATICACIDVEN" in the last 168 hours.  No results found for this or any previous visit (from the past 240 hour(s)).   Radiology Studies: VAS [place] LOWER EXTREMITY VENOUS (DVT)  Result Date: 08/05/2021  Lower Venous DVT Study Patient Name:  HEMAN [facility]  Date of Exam:   08/05/2021 Medical Rec #: [id number]      Accession #:    [id number] Date of Birth: [birth year]      Patient Gender: M Patient Age:   24 years Exam Location:  [facility] Procedure:      VAS [place] LOWER EXTREMITY VENOUS (DVT) Referring Phys: PRANAV PATEL --------------------------------------------------------------------------------  Indications: Edema.  Risk Factors: Cancer. Comparison Study: No prior studies. Performing Technologist: Oliver Hum RVT  Examination Guidelines: A complete evaluation includes B-mode imaging, spectral Doppler, color Doppler, and power Doppler as needed of all accessible portions of each vessel. Bilateral testing is considered an integral part of a complete examination.  Limited examinations for reoccurring indications may be performed as noted. The reflux portion of the exam is performed with the patient in reverse Trendelenburg.  +---------+---------------+---------+-----------+----------+--------------+ RIGHT    CompressibilityPhasicitySpontaneityPropertiesThrombus Aging +---------+---------------+---------+-----------+----------+--------------+ CFV      Full           Yes      Yes                                 +---------+---------------+---------+-----------+----------+--------------+ SFJ      Full                                                        +---------+---------------+---------+-----------+----------+--------------+ FV Prox  Full                                                        +---------+---------------+---------+-----------+----------+--------------+ FV Mid   Full                                                        +---------+---------------+---------+-----------+----------+--------------+ FV DistalFull                                                        +---------+---------------+---------+-----------+----------+--------------+ PFV      Full                                                        +---------+---------------+---------+-----------+----------+--------------+ POP      Full           Yes      Yes                                 +---------+---------------+---------+-----------+----------+--------------+ PTV      Full                                                        +---------+---------------+---------+-----------+----------+--------------+  PERO     Full                                                        +---------+---------------+---------+-----------+----------+--------------+   +----+---------------+---------+-----------+----------+--------------+ LEFTCompressibilityPhasicitySpontaneityPropertiesThrombus Aging  +----+---------------+---------+-----------+----------+--------------+ CFV Full           Yes      Yes                                 +----+---------------+---------+-----------+----------+--------------+     Summary: RIGHT: - There is no evidence of deep vein thrombosis in the lower extremity.  - No cystic structure found in the popliteal fossa.  LEFT: - No evidence of common femoral vein obstruction.  *See table(s) above for measurements and observations.    Preliminary     Scheduled Meds:  apixaban  5 mg Oral BID   [START ON 08/06/2021] metoprolol succinate  50 mg Oral Daily   pantoprazole  40 mg Oral Daily   PARoxetine  10 mg Oral QHS   predniSONE  40 mg Oral Q breakfast   Continuous Infusions:  amiodarone 30 mg/hr (08/05/21 0700)     LOS: 2 days   Darliss Cheney, MD [facility]  08/05/2021, 2:20 PM   *Please note that this is a verbal dictation therefore any spelling or grammatical errors are due to the "[place] [employer]" system interpretation.  Please page via [place] and do not message via secure chat for urgent patient care matters. Secure chat can be used for non urgent patient care matters.  How to contact the [facility] Attending or Consulting provider [place] or covering provider during after hours [place], for this patient?  Check the care team in [facility] and look for a) attending/consulting [facility] provider listed and b) the [facility] team listed. Page or secure chat [phone]. Log into [email or web address] and use 's universal password to access. If you do not have the password, please contact the hospital operator. Locate the [facility] provider you are looking for under [facility] and page to a number that you can be directly reached. If you still have difficulty reaching the provider, please page the [facility] (Director on Call) for the Hospitalists listed on amion for assistance.

## 2021-08-05 NOTE — Progress Notes (Signed)
Right lower extremity venous duplex has been completed. Preliminary results can be found in CV Proc through chart review.   08/05/21 9:08 AM John [name] RVT

## 2021-08-05 NOTE — Progress Notes (Signed)
   08/05/21 1307  Assess: MEWS Score  Temp 98 F (36.7 C)  BP 98/74  MAP (mmHg) 82  Pulse Rate (!) 143  Resp 20  Level of Consciousness Alert  SpO2 98 %  O2 Device Room Air  Assess: MEWS Score  MEWS Temp 0  MEWS Systolic 1  MEWS Pulse 3  MEWS RR 0  MEWS LOC 0  MEWS Score 4  MEWS Score Color Red  Assess: if the MEWS score is Yellow or Red  Were vital signs taken at a resting state? Yes  Focused Assessment No change from prior assessment  Does the patient meet 2 or more of the SIRS criteria? No  Does the patient have a confirmed or suspected source of infection? No  MEWS guidelines implemented *See Row Information* Yes  Treat  Pain Scale 0-10  Pain Score 0  Take Vital Signs  Increase Vital Sign Frequency  Red: Q 1hr X 4 then Q 4hr X 4, if remains red, continue Q 4hrs  Escalate  MEWS: Escalate Red: discuss with charge nurse/RN and provider, consider discussing with RRT  Notify: Charge Nurse/RN  Name of Charge Nurse/RN Notified Jerrye Noble, RN  Date Charge Nurse/RN Notified 08/05/21  Time Charge Nurse/RN Notified 1317  Notify: Provider  Provider Name/Title M. Skains MD/ [place]  Date Provider Notified 08/05/21  Time Provider Notified 1317  Method of Notification Face-to-face  Notification Reason Other (Comment)  Provider response See new orders (extra metoprolol dose)  Date of Provider Response 08/05/21  Time of Provider Response 5035  Notify: Rapid Response  Name of Rapid Response RN Notified Royston Cowper RN  Date Rapid Response Notified 08/05/21  Time Rapid Response Notified 1318  Assess: SIRS CRITERIA  SIRS Temperature  0  SIRS Pulse 1  SIRS Respirations  0  SIRS WBC 0  SIRS Score Sum  1

## 2021-08-05 NOTE — Progress Notes (Signed)
Overall, [name] is doing about the same.  He is on the amiodarone drip.  Cardiology is following this very closely.  For some reason, insurance denied his radiation.  I have no clue as to why this would be.  I have never have had a patient be denied radiation therapy.  The only thing I could think of is that Radiation Oncology wanted to do radiosurgery.  I will have to talk with Dr. [name] about this.  His labs show white count of 10.4.  Hemoglobin 11.9.  Platelet count 158,000.  The sodium is 140.  Potassium 3.4.  BUN 21 creatinine 0.94.  Calcium 8.3.  He is not complain of any pain.  He is eating well.  He is out of bed a little bit.  He says he is going to the bathroom.  There is no increased cough.  He is on prednisone.  There is no bleeding.  He has had no rashes.  His vital signs show temperature of 97.6.  Pulse 55.  Blood pressure 108/62.  Oxygen saturation on room air is 92%.  His lungs sound clear bilaterally.  He has good air movement bilaterally.  Cardiac exam is a regular rate and irregular rhythm consistent with atrial fibrillation.  Abdomen is soft.  Bowel sounds are present.  He has no fluid wave.  Extremity shows no clubbing, cyanosis or edema.  There is no tenderness to palpation.  Neurological exam is nonfocal.  [name] is doing with the atrial fibrillation.  Again, Cardiology is doing a great job trying to get him into rhythm or at least given his rate controlled.  [name] says whenever he gets out of bed, his heart rate goes up.  Again I am not sure why insurance would denies radiation.  We will have to speak with Radiation Oncology about this.  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  This will help with pain control.  I do appreciate the incredible care he is getting from all the staff on 4 E.  [name], MD  Hebrews 10:35-36

## 2021-08-05 NOTE — Progress Notes (Signed)
Progress Note  Patient Name: John [name] Date of Encounter: 08/05/2021  [facility] Cardiologist: Fransico Him, MD   Subjective   Heart rates are still challenging control.  No chest pain.  Inpatient Medications    Scheduled Meds:  apixaban  5 mg Oral BID   metoprolol succinate  37.5 mg Oral Daily   pantoprazole  40 mg Oral Daily   PARoxetine  10 mg Oral QHS   predniSONE  40 mg Oral Q breakfast   Continuous Infusions:  amiodarone 30 mg/hr (08/05/21 0700)   PRN Meds: acetaminophen **OR** acetaminophen, benzonatate, ondansetron **OR** ondansetron (ZOFRAN) IV, polyethylene glycol   Vital Signs    Vitals:   08/04/21 2100 08/05/21 0441 08/05/21 0813 08/05/21 1100  BP:  108/62 118/84 106/70  Pulse:  (!) 55    Resp: 19 19    Temp:  97.6 F (36.4 C)    TempSrc:      SpO2:  92% 99%   Weight:      Height:        Intake/Output Summary (Last 24 hours) at 08/05/2021 1200 Last data filed at 08/05/2021 0700 Gross per 24 hour  Intake 1262.75 ml  Output --  Net 1262.75 ml      08/02/2021   10:05 AM 07/29/2021    7:37 AM 07/22/2021    3:36 PM  Last 3 Weights  Weight (lbs) 229 lb 14.4 oz 231 lb 4 oz 226 lb 12.8 oz  Weight (kg) 104.282 kg 104.894 kg 102.876 kg      Telemetry    Atrial fibrillation heart rates from 100-130- Personally Reviewed  ECG    No new- Personally Reviewed  Physical Exam   GEN: No acute distress.   Neck: No JVD Cardiac: Irregular tachycardic, no murmurs, rubs, or gallops.  Respiratory: Clear to auscultation bilaterally. GI: Soft, nontender, non-distended  MS: No edema; No deformity. Neuro:  Nonfocal  Psych: Normal affect   Labs    High Sensitivity Troponin:   Recent Labs  Lab 08/02/21 0654  TROPONINIHS 2     Chemistry Recent Labs  Lab 08/01/21 2301 08/03/21 0418 08/04/21 0451 08/05/21 0542  NA 139 141 138 140  K 3.3* 3.4* 3.9 3.4*  CL 107 104 105 105  CO2 23 29 25 27   GLUCOSE 161* 116* 130* 88  BUN 23* 18 21* 21*   CREATININE 1.03 0.88 0.85 0.94  CALCIUM 8.2* 8.5* 8.4* 8.3*  MG 2.2 2.3  --  2.0  PROT  --  5.5* 5.6*  --   ALBUMIN  --  2.8* 2.8*  --   AST  --  12* 12*  --   ALT  --  25 21  --   ALKPHOS  --  64 62  --   BILITOT  --  0.7 0.7  --   GFRNONAA >60 >60 >60 >60  ANIONGAP 9 8 8 8     Lipids No results for input(s): "CHOL", "TRIG", "HDL", "LABVLDL", "LDLCALC", "CHOLHDL" in the last 168 hours.  Hematology Recent Labs  Lab 08/03/21 0418 08/04/21 0451 08/05/21 0542  WBC 11.3* 11.9* 10.4  RBC 4.22 4.17* 4.15*  HGB 11.6* 11.7* 11.9*  HCT 37.4* 36.4* 35.8*  MCV 88.6 87.3 86.3  MCH 27.5 28.1 28.7  MCHC 31.0 32.1 33.2  RDW 19.6* 19.5* 19.4*  PLT 164 169 158   Thyroid  Recent Labs  Lab 08/01/21 2301  TSH 1.582    BNP Recent Labs  Lab 08/01/21 2301  BNP 69.1  DDimer  Recent Labs  Lab 08/02/21 1050  DDIMER 3.61*     Radiology    VAS [place] LOWER EXTREMITY VENOUS (DVT)  Result Date: 08/05/2021  Lower Venous DVT Study Patient Name:  John [name]  Date of Exam:   08/05/2021 Medical Rec #: [id number]      Accession #:    [id number] Date of Birth: December 01, 1960      Patient Gender: M Patient Age:   61 years Exam Location:  [facility] Procedure:      VAS [place] LOWER EXTREMITY VENOUS (DVT) Referring Phys: PRANAV PATEL --------------------------------------------------------------------------------  Indications: Edema.  Risk Factors: Cancer. Comparison Study: No prior studies. Performing Technologist: Oliver Hum RVT  Examination Guidelines: A complete evaluation includes B-mode imaging, spectral Doppler, color Doppler, and power Doppler as needed of all accessible portions of each vessel. Bilateral testing is considered an integral part of a complete examination. Limited examinations for reoccurring indications may be performed as noted. The reflux portion of the exam is performed with the patient in reverse Trendelenburg.   +---------+---------------+---------+-----------+----------+--------------+ RIGHT    CompressibilityPhasicitySpontaneityPropertiesThrombus Aging +---------+---------------+---------+-----------+----------+--------------+ CFV      Full           Yes      Yes                                 +---------+---------------+---------+-----------+----------+--------------+ SFJ      Full                                                        +---------+---------------+---------+-----------+----------+--------------+ FV Prox  Full                                                        +---------+---------------+---------+-----------+----------+--------------+ FV Mid   Full                                                        +---------+---------------+---------+-----------+----------+--------------+ FV DistalFull                                                        +---------+---------------+---------+-----------+----------+--------------+ PFV      Full                                                        +---------+---------------+---------+-----------+----------+--------------+ POP      Full           Yes      Yes                                 +---------+---------------+---------+-----------+----------+--------------+  PTV      Full                                                        +---------+---------------+---------+-----------+----------+--------------+ PERO     Full                                                        +---------+---------------+---------+-----------+----------+--------------+   +----+---------------+---------+-----------+----------+--------------+ LEFTCompressibilityPhasicitySpontaneityPropertiesThrombus Aging +----+---------------+---------+-----------+----------+--------------+ CFV Full           Yes      Yes                                  +----+---------------+---------+-----------+----------+--------------+     Summary: RIGHT: - There is no evidence of deep vein thrombosis in the lower extremity.  - No cystic structure found in the popliteal fossa.  LEFT: - No evidence of common femoral vein obstruction.  *See table(s) above for measurements and observations.    Preliminary     Cardiac Studies   EF 60 to 65%  Patient Profile     61 y.o. male with atrial fibrillation persistent with rapid ventricular response, metastatic squamous cell cancer.  Assessment & Plan    Atrial fibrillation with RVR - IV amiodarone drip continue. -I will increase Toprol to 50 mg a day. -Still challenging to rate control.  Hopefully chemical conversion will take place as it has in the past.  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 Antonieta Pert notes reviewed.     For questions or updates, please contact [place] Please consult [email or web address] for contact info under        Signed, Candee Furbish, MD  08/05/2021, 12:00 PM

## 2021-08-05 NOTE — Progress Notes (Addendum)
Notified cardiology through secure chat "Thelen is on an amio drip, getting Po 37.5 metoprolol and HR is consistently getting into the 160s, briefly into 190s on exertion. at rest he is anywhere between 100-135bpm"  Received response from [place], inquiring on patient's blood pressure at this time.   No new orders currently. Patient remains stable at this time. I provided patient with 2 urinals and instructed him to not ambulate to the bathroom unassisted until cardiology's examine and recommendations for today.

## 2021-08-06 DIAGNOSIS — I480 Paroxysmal atrial fibrillation: Secondary | ICD-10-CM | POA: Diagnosis not present

## 2021-08-06 MED ORDER — AMIODARONE HCL 200 MG PO TABS
400.0000 mg | ORAL_TABLET | Freq: Two times a day (BID) | ORAL | Status: DC
  Administered 2021-08-06 – 2021-08-07 (×3): 400 mg via ORAL
  Filled 2021-08-06 (×2): qty 2

## 2021-08-06 NOTE — Progress Notes (Signed)
PROGRESS NOTE    John [name]  LNL:[id number] DOB: 03-Mar-1960 DOA: 08/01/2021 PCP: [name], MD   Brief Narrative:  Past medical history of stage IV squamous cell lung cancer currently on chemotherapy and radiation, CAD, pulmonary fibrosis, COPD, arthritis, anemia,, obesity, on chronic steroids, chronic respiratory failure on 4 LPM at home presents to the hospital with complaints of palpitation and found to have A-fib with RVR. Cardiology consulted due to uncontrolled heart rates..  Assessment & Plan:   Principal Problem:   Paroxysmal atrial fibrillation with RVR (HCC) Active Problems:   Drug-induced pneumonitis   Hypokalemia   COPD with asthma ([place])   Coronary artery disease involving native coronary artery of native heart without angina pectoris   Stage 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 (HCC)   Mixed hyperlipidemia   Obesity   H/O rheumatoid arthritis  Paroxysmal atrial fibrillation with RVR ([place]) At home on Toprol-XL. Presents with complaints of palpitation. Found to have A-fib with a heart rate 150. Initially started on IV Cardizem infusion. Echocardiogram performed shows preserved EF. Patient was transitioned to oral Cardizem but heart rate remained elevated therefore cardiology was consulted. Cardizem discontinued due to lack of effectiveness and patient preference. Patient is now on IV amiodarone infusion along with oral metoprolol. Chronically on anticoagulation/Eliquis.  Heart rate is still elevated at times at rest, I suspect the heart rate will go higher with activity.  Cardiology considering digoxin.  Management per cardiology.   Drug-induced pneumonitis Most likely due to [facility]. Oxygenation improving. Continue prednisone taper.  He is on room air.    Hypokalemia: replaced  COPD with asthma ([place]) No evidence of exacerbation. Continue inhalers. Monitor.   Coronary artery disease involving native coronary artery of native heart without  angina pectoris No evidence of acute ischemia. Echocardiogram shows preserved EF. Monitor for now.   Stage 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 [facility]) Adrenal metastatic lesion. Appreciate oncology consultation. Patient has seen radiation oncology outpatient for adrenal lesion radiation. Patient has severe pain from the lesion limiting his mobility. Currently awaiting insurance authorization. Management per oncology and radiation oncology.   Right lower leg edema. Significant swelling of the right leg.  DVT ruled out.    Mixed hyperlipidemia Continue statin.     Obesity Body mass index is 32.06 kg/m.  Placing the pt at higher risk of poor outcomes.     H/O rheumatoid arthritis Used to be on Simponi.  Currently not on any therapy.  DVT prophylaxis:    Code Status: Full Code  Family Communication:  None present at bedside.  Plan of care discussed with patient in length and he/she verbalized understanding and agreed with it.  Status is: Inpatient Remains inpatient appropriate because: Still with atrial fibrillation with RVR on IV amiodarone   Estimated body mass index is 32.06 kg/m as calculated from the following:   Height as of this encounter: 5\' 11"  (1.803 m).   Weight as of this encounter: 104.3 kg.    Nutritional Assessment: Body mass index is 32.06 kg/m.[name] Seen by dietician.  I agree with the assessment and plan as outlined below: Nutrition Status:        . Skin Assessment: I have examined the patient's skin and I agree with the wound assessment as performed by the wound care RN as outlined below:    Consultants:  Cardiology Oncology  Procedures:  None  Antimicrobials:  Anti-infectives (From admission, onward)    None         Subjective:  Seen and examined.  He has no complaints.  Objective: Vitals:   08/05/21 1700 08/05/21 2049 08/06/21 0444 08/06/21 0446  BP: 121/80 95/70 (!) 87/61 101/71  Pulse: (!) 105 91 89 81  Resp: (!) 23 18  20    Temp: 98 F (36.7 C) 98 F (36.7 C) 97.6 F (36.4 C)   TempSrc: Oral Oral Oral   SpO2:  94% 97% 95%  Weight:      Height:        Intake/Output Summary (Last 24 hours) at 08/06/2021 1159 Last data filed at 08/06/2021 0900 Gross per 24 hour  Intake 1343.39 ml  Output 1750 ml  Net -406.61 ml    Filed Weights   08/02/21 1005  Weight: 104.3 kg    Examination:  General exam: Appears calm and comfortable, obese Respiratory system: Clear to auscultation. Respiratory effort normal. Cardiovascular system: S1 & S2 heard, irregularly irregular rate and rhythm. No JVD, murmurs, rubs, gallops or clicks. No pedal edema. Gastrointestinal system: Abdomen is nondistended, soft and nontender. No organomegaly or masses felt. Normal bowel sounds heard. Central nervous system: Alert and oriented. No focal neurological deficits. Extremities: Symmetric 5 x 5 power. Skin: No rashes, lesions or ulcers.  Psychiatry: Judgement and insight appear normal. Mood & affect appropriate.   Data Reviewed: I have personally reviewed following labs and imaging studies  CBC: Recent Labs  Lab 08/01/21 2301 08/03/21 0418 08/04/21 0451 08/05/21 0542  WBC 17.5* 11.3* 11.9* 10.4  NEUTROABS  --  9.2* 10.1*  --   HGB 12.1* 11.6* 11.7* 11.9*  HCT 38.1* 37.4* 36.4* 35.8*  MCV 87.0 88.6 87.3 86.3  PLT 197 164 169 546    Basic Metabolic Panel: Recent Labs  Lab 08/01/21 2301 08/03/21 0418 08/04/21 0451 08/05/21 0542  NA 139 141 138 140  K 3.3* 3.4* 3.9 3.4*  CL 107 104 105 105  CO2 23 29 25 27   GLUCOSE 161* 116* 130* 88  BUN 23* 18 21* 21*  CREATININE 1.03 0.88 0.85 0.94  CALCIUM 8.2* 8.5* 8.4* 8.3*  MG 2.2 2.3  --  2.0    GFR: Estimated Creatinine Clearance: 104 mL/min (by C-G formula based on SCr of 0.94 mg/dL). Liver Function Tests: Recent Labs  Lab 08/03/21 0418 08/04/21 0451  AST 12* 12*  ALT 25 21  ALKPHOS 64 62  BILITOT 0.7 0.7  PROT 5.5* 5.6*  ALBUMIN 2.8* 2.8*    No  results for input(s): "LIPASE", "AMYLASE" in the last 168 hours. No results for input(s): "AMMONIA" in the last 168 hours. Coagulation Profile: No results for input(s): "INR", "PROTIME" in the last 168 hours. Cardiac Enzymes: No results for input(s): "CKTOTAL", "CKMB", "CKMBINDEX", "TROPONINI" in the last 168 hours. BNP (last 3 results) No results for input(s): "PROBNP" in the last 8760 hours. HbA1C: No results for input(s): "HGBA1C" in the last 72 hours. CBG: No results for input(s): "GLUCAP" in the last 168 hours. Lipid Profile: No results for input(s): "CHOL", "HDL", "LDLCALC", "TRIG", "CHOLHDL", "LDLDIRECT" in the last 72 hours. Thyroid Function Tests: No results for input(s): "TSH", "T4TOTAL", "FREET4", "T3FREE", "THYROIDAB" in the last 72 hours. Anemia Panel: No results for input(s): "VITAMINB12", "FOLATE", "FERRITIN", "TIBC", "IRON", "RETICCTPCT" in the last 72 hours. Sepsis Labs: No results for input(s): "PROCALCITON", "LATICACIDVEN" in the last 168 hours.  No results found for this or any previous visit (from the past 240 hour(s)).   Radiology Studies: VAS [place] LOWER EXTREMITY VENOUS (DVT)  Result Date: 08/05/2021  Lower Venous DVT Study  Patient Name:  XAVI TOMASIK  Date of Exam:   08/05/2021 Medical Rec #: [id number]      Accession #:    [id number] Date of Birth: December 20, 1960      Patient Gender: M Patient Age:   14 years Exam Location:  [facility] Procedure:      VAS [place] LOWER EXTREMITY VENOUS (DVT) Referring Phys: PRANAV PATEL --------------------------------------------------------------------------------  Indications: Edema.  Risk Factors: Cancer. Comparison Study: No prior studies. Performing Technologist: Oliver Hum RVT  Examination Guidelines: A complete evaluation includes B-mode imaging, spectral Doppler, color Doppler, and power Doppler as needed of all accessible portions of each vessel. Bilateral testing is considered an integral part of a complete examination.  Limited examinations for reoccurring indications may be performed as noted. The reflux portion of the exam is performed with the patient in reverse Trendelenburg.  +---------+---------------+---------+-----------+----------+--------------+ RIGHT    CompressibilityPhasicitySpontaneityPropertiesThrombus Aging +---------+---------------+---------+-----------+----------+--------------+ CFV      Full           Yes      Yes                                 +---------+---------------+---------+-----------+----------+--------------+ SFJ      Full                                                        +---------+---------------+---------+-----------+----------+--------------+ FV Prox  Full                                                        +---------+---------------+---------+-----------+----------+--------------+ FV Mid   Full                                                        +---------+---------------+---------+-----------+----------+--------------+ FV DistalFull                                                        +---------+---------------+---------+-----------+----------+--------------+ PFV      Full                                                        +---------+---------------+---------+-----------+----------+--------------+ POP      Full           Yes      Yes                                 +---------+---------------+---------+-----------+----------+--------------+ PTV      Full                                                        +---------+---------------+---------+-----------+----------+--------------+  PERO     Full                                                        +---------+---------------+---------+-----------+----------+--------------+   +----+---------------+---------+-----------+----------+--------------+ LEFTCompressibilityPhasicitySpontaneityPropertiesThrombus Aging  +----+---------------+---------+-----------+----------+--------------+ CFV Full           Yes      Yes                                 +----+---------------+---------+-----------+----------+--------------+     Summary: RIGHT: - There is no evidence of deep vein thrombosis in the lower extremity.  - No cystic structure found in the popliteal fossa.  LEFT: - No evidence of common femoral vein obstruction.  *See table(s) above for measurements and observations. Electronically signed by Harold Barban MD on 08/05/2021 at 9:24:38 PM.    Final     Scheduled Meds:  apixaban  5 mg Oral BID   metoprolol succinate  50 mg Oral Daily   pantoprazole  40 mg Oral Daily   PARoxetine  10 mg Oral QHS   predniSONE  40 mg Oral Q breakfast   Continuous Infusions:  amiodarone 30 mg/hr (08/06/21 0507)     LOS: 3 days   Darliss Cheney, MD [facility]  08/06/2021, 11:59 AM   *Please note that this is a verbal dictation therefore any spelling or grammatical errors are due to the "[place] [employer]" system interpretation.  Please page via [place] and do not message via secure chat for urgent patient care matters. Secure chat can be used for non urgent patient care matters.  How to contact the [facility] Attending or Consulting provider [place] or covering provider during after hours [place], for this patient?  Check the care team in [facility] and look for a) attending/consulting [facility] provider listed and b) the [facility] team listed. Page or secure chat [phone]. Log into [email or web address] and use [place]'s universal password to access. If you do not have the password, please contact the hospital operator. Locate the [facility] provider you are looking for under [facility] and page to a number that you can be directly reached. If you still have difficulty reaching the provider, please page the [facility] (Director on Call) for the Hospitalists listed on amion for assistance.

## 2021-08-06 NOTE — Progress Notes (Signed)
John [name] is doing pretty well this morning.  He feels like his heart is getting back into rhythm.  It is clearly not as rapid.  He is still on the amiodarone infusion.  He is eating well.  He is having no problems with nausea or vomiting.  He has had no problems with cough.  Hopefully, he will be able to have radiation therapy.  I spoke to Radiation Oncology yesterday.  They will have to see why the radiation was denied by insurance.  He did have Dopplers of his legs yesterday.  They are negative for any thromboembolic disease.  He has had no lab work done today.  He is going to the bathroom.  He is having no bleeding.  His vital signs are temperature of 97.6.  Pulse 81.  Blood pressure 101/71.  His head neck exam shows no ocular or oral lesions.  He has no adenopathy in the neck.  Lungs are relatively clear bilaterally.  He may have some wheezing.  He has good air movement.  Cardiac exam regular rate and rhythm consistent with atrial fibrillation.  Rate seems to be well controlled.  Abdomen is soft.  Bowel sounds are present.  He has little tenderness in the left side.  Extremity shows no clubbing, cyanosis or edema.  Again, John [name] is in because of the atrial fibrillation.  Hopefully, this will be controlled.  Again may be he will be able to have the radiation therapy.  We will continue to follow along.  I know that he is getting incredible care from all the staff on 4 E.  [name], MD  Psalms 6:2

## 2021-08-07 ENCOUNTER — Telehealth: Payer: Self-pay | Admitting: Oncology

## 2021-08-07 DIAGNOSIS — I480 Paroxysmal atrial fibrillation: Secondary | ICD-10-CM | POA: Diagnosis not present

## 2021-08-07 LAB — BASIC METABOLIC PANEL
Anion gap: 7 (ref 5–15)
BUN: 20 mg/dL (ref 6–20)
CO2: 27 mmol/L (ref 22–32)
Calcium: 8.6 mg/dL — ABNORMAL LOW (ref 8.9–10.3)
Chloride: 104 mmol/L (ref 98–111)
Creatinine, Ser: 1.01 mg/dL (ref 0.61–1.24)
GFR, Estimated: >60 mL/min (ref >60)
Glucose, Bld: 92 mg/dL (ref 70–99)
Potassium: 3.1 mmol/L — ABNORMAL LOW (ref 3.5–5.1)
Sodium: 138 mmol/L (ref 135–145)

## 2021-08-07 LAB — MAGNESIUM: Magnesium: 2.1 mg/dL (ref 1.7–2.4)

## 2021-08-07 MED ORDER — POTASSIUM CHLORIDE CRYS ER 20 MEQ PO TBCR
40.0000 meq | EXTENDED_RELEASE_TABLET | ORAL | Status: AC
  Administered 2021-08-07 (×2): 40 meq via ORAL
  Filled 2021-08-07 (×2): qty 2

## 2021-08-07 MED ORDER — METOPROLOL SUCCINATE ER 50 MG PO TB24: 50.0000 mg | ORAL_TABLET | Freq: Every day | ORAL | 0 refills | Status: DC

## 2021-08-07 MED ORDER — AMIODARONE HCL 200 MG PO TABS: ORAL_TABLET | ORAL | 0 refills | Status: DC

## 2021-08-10 ENCOUNTER — Other Ambulatory Visit: Payer: Self-pay

## 2021-08-10 ENCOUNTER — Ambulatory Visit
Admission: RE | Admit: 2021-08-10 | Discharge: 2021-08-10 | Disposition: A | Discharge status: Home / Self Care | Payer: 59 | Source: Ambulatory Visit | Attending: Radiation Oncology | Admitting: Radiation Oncology

## 2021-08-10 DIAGNOSIS — C3411 Malignant neoplasm of upper lobe, right bronchus or lung: Secondary | ICD-10-CM | POA: Diagnosis not present

## 2021-08-10 DIAGNOSIS — C779 Secondary and unspecified malignant neoplasm of lymph node, unspecified: Secondary | ICD-10-CM | POA: Insufficient documentation

## 2021-08-10 DIAGNOSIS — Z510 Encounter for antineoplastic radiation therapy: Secondary | ICD-10-CM | POA: Insufficient documentation

## 2021-08-10 DIAGNOSIS — C7971 Secondary malignant neoplasm of right adrenal gland: Secondary | ICD-10-CM | POA: Insufficient documentation

## 2021-08-10 LAB — RAD ONC ARIA SESSION SUMMARY
Course Elapsed Days: 0
Plan Fractions Treated to Date: 1
Plan Prescribed Dose Per Fraction: 5 Gy
Plan Total Fractions Prescribed: 10
Plan Total Prescribed Dose: 50 Gy
Reference Point Dosage Given to Date: 5 Gy
Reference Point Session Dosage Given: 5 Gy
Session Number: 1

## 2021-08-11 ENCOUNTER — Ambulatory Visit
Admission: RE | Admit: 2021-08-11 | Discharge: 2021-08-11 | Disposition: A | Discharge status: Home / Self Care | Payer: 59 | Source: Ambulatory Visit | Attending: Radiation Oncology | Admitting: Radiation Oncology

## 2021-08-11 ENCOUNTER — Ambulatory Visit: Payer: 59

## 2021-08-11 ENCOUNTER — Other Ambulatory Visit: Payer: Self-pay

## 2021-08-11 ENCOUNTER — Telehealth: Payer: Self-pay | Admitting: Pharmacist

## 2021-08-11 DIAGNOSIS — Z510 Encounter for antineoplastic radiation therapy: Secondary | ICD-10-CM | POA: Diagnosis not present

## 2021-08-11 LAB — RAD ONC ARIA SESSION SUMMARY
Course Elapsed Days: 1
Plan Fractions Treated to Date: 2
Plan Prescribed Dose Per Fraction: 5 Gy
Plan Total Fractions Prescribed: 10
Plan Total Prescribed Dose: 50 Gy
Reference Point Dosage Given to Date: 10 Gy
Reference Point Session Dosage Given: 5 Gy
Session Number: 2

## 2021-08-12 ENCOUNTER — Encounter: Payer: Self-pay | Admitting: Hematology & Oncology

## 2021-08-12 ENCOUNTER — Other Ambulatory Visit: Payer: Self-pay

## 2021-08-12 ENCOUNTER — Inpatient Hospital Stay (HOSPITAL_BASED_OUTPATIENT_CLINIC_OR_DEPARTMENT_OTHER): Payer: 59 | Admitting: Hematology & Oncology

## 2021-08-12 ENCOUNTER — Inpatient Hospital Stay: Payer: 59

## 2021-08-12 ENCOUNTER — Ambulatory Visit
Admission: RE | Admit: 2021-08-12 | Discharge: 2021-08-12 | Disposition: A | Discharge status: Home / Self Care | Payer: 59 | Source: Ambulatory Visit | Attending: Radiation Oncology | Admitting: Radiation Oncology

## 2021-08-12 VITALS — BP 101/68 | HR 70 | Temp 98.2°F | Resp 18 | Wt 229.0 lb

## 2021-08-12 DIAGNOSIS — C779 Secondary and unspecified malignant neoplasm of lymph node, unspecified: Secondary | ICD-10-CM | POA: Diagnosis not present

## 2021-08-12 DIAGNOSIS — D649 Anemia, unspecified: Secondary | ICD-10-CM

## 2021-08-12 DIAGNOSIS — Z79899 Other long term (current) drug therapy: Secondary | ICD-10-CM | POA: Insufficient documentation

## 2021-08-12 DIAGNOSIS — C3411 Malignant neoplasm of upper lobe, right bronchus or lung: Secondary | ICD-10-CM | POA: Insufficient documentation

## 2021-08-12 DIAGNOSIS — C7971 Secondary malignant neoplasm of right adrenal gland: Secondary | ICD-10-CM | POA: Insufficient documentation

## 2021-08-12 DIAGNOSIS — C3410 Malignant neoplasm of upper lobe, unspecified bronchus or lung: Secondary | ICD-10-CM | POA: Diagnosis not present

## 2021-08-12 DIAGNOSIS — I4891 Unspecified atrial fibrillation: Secondary | ICD-10-CM | POA: Insufficient documentation

## 2021-08-12 DIAGNOSIS — C3401 Malignant neoplasm of right main bronchus: Secondary | ICD-10-CM

## 2021-08-12 DIAGNOSIS — Z510 Encounter for antineoplastic radiation therapy: Secondary | ICD-10-CM | POA: Insufficient documentation

## 2021-08-12 LAB — RAD ONC ARIA SESSION SUMMARY
Course Elapsed Days: 2
Plan Fractions Treated to Date: 3
Plan Prescribed Dose Per Fraction: 5 Gy
Plan Total Fractions Prescribed: 10
Plan Total Prescribed Dose: 50 Gy
Reference Point Dosage Given to Date: 15 Gy
Reference Point Session Dosage Given: 5 Gy
Session Number: 3

## 2021-08-12 LAB — CBC WITH DIFFERENTIAL (CANCER CENTER ONLY)
Abs Immature Granulocytes: 0.09 10*3/uL — ABNORMAL HIGH (ref 0.00–0.07)
Basophils Absolute: 0 10*3/uL (ref 0.0–0.1)
Basophils Relative: 0 %
Eosinophils Absolute: 0.1 10*3/uL (ref 0.0–0.5)
Eosinophils Relative: 1 %
HCT: 36.3 % — ABNORMAL LOW (ref 39.0–52.0)
Hemoglobin: 11.8 g/dL — ABNORMAL LOW (ref 13.0–17.0)
Immature Granulocytes: 1 %
Lymphocytes Relative: 9 %
Lymphs Abs: 0.6 10*3/uL — ABNORMAL LOW (ref 0.7–4.0)
MCH: 28.2 pg (ref 26.0–34.0)
MCHC: 32.5 g/dL (ref 30.0–36.0)
MCV: 86.6 fL (ref 80.0–100.0)
Monocytes Absolute: 0.3 10*3/uL (ref 0.1–1.0)
Monocytes Relative: 4 %
Neutro Abs: 6.2 10*3/uL (ref 1.7–7.7)
Neutrophils Relative %: 85 %
Platelet Count: 172 10*3/uL (ref 150–400)
RBC: 4.19 MIL/uL — ABNORMAL LOW (ref 4.22–5.81)
RDW: 18.7 % — ABNORMAL HIGH (ref 11.5–15.5)
WBC Count: 7.3 10*3/uL (ref 4.0–10.5)
nRBC: 0 % (ref 0.0–0.2)

## 2021-08-12 LAB — CMP (CANCER CENTER ONLY)
ALT: 16 U/L (ref 0–44)
AST: 11 U/L — ABNORMAL LOW (ref 15–41)
Albumin: 3.5 g/dL (ref 3.5–5.0)
Alkaline Phosphatase: 64 U/L (ref 38–126)
Anion gap: 9 (ref 5–15)
BUN: 13 mg/dL (ref 6–20)
CO2: 28 mmol/L (ref 22–32)
Calcium: 9.1 mg/dL (ref 8.9–10.3)
Chloride: 100 mmol/L (ref 98–111)
Creatinine: 0.97 mg/dL (ref 0.61–1.24)
GFR, Estimated: >60 mL/min (ref >60)
Glucose, Bld: 141 mg/dL — ABNORMAL HIGH (ref 70–99)
Potassium: 3.2 mmol/L — ABNORMAL LOW (ref 3.5–5.1)
Sodium: 137 mmol/L (ref 135–145)
Total Bilirubin: 0.5 mg/dL (ref 0.3–1.2)
Total Protein: 6 g/dL — ABNORMAL LOW (ref 6.5–8.1)

## 2021-08-12 LAB — FERRITIN: Ferritin: 232 ng/mL (ref 24–336)

## 2021-08-12 LAB — LACTATE DEHYDROGENASE: LDH: 233 U/L — ABNORMAL HIGH (ref 98–192)

## 2021-08-12 LAB — PREALBUMIN: Prealbumin: 25.4 mg/dL (ref 18–38)

## 2021-08-12 LAB — IRON AND IRON BINDING CAPACITY (CC-WL,HP ONLY)
Iron: 58 ug/dL (ref 45–182)
Saturation Ratios: 25 % (ref 17.9–39.5)
TIBC: 232 ug/dL — ABNORMAL LOW (ref 250–450)
UIBC: 174 ug/dL (ref 117–376)

## 2021-08-12 LAB — VITAMIN B12: Vitamin B-12: 139 pg/mL — ABNORMAL LOW (ref 180–914)

## 2021-08-12 LAB — TSH: TSH: 2.571 u[IU]/mL (ref 0.350–4.500)

## 2021-08-12 NOTE — Progress Notes (Signed)
+ Hematology and Oncology Follow Up Visit  Keron Neenan [id number] 01/06/1961 61 y.o. 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 MDiioriois here today for follow-up.  He was discharged from the hospital recently.  He went in with rapid A-fib.  This is caused him to have some breathing issues.  He was placed on amiodarone.  This seemed to help control his heart rate quite nicely.  It sounds like he has been back in rhythm.  He currently is getting radiation therapy for a adrenal metastasis.  This has been causing some back discomfort.  So far, he is done well with the radiation.  He has had no issues with diarrhea.  There may be a little bit of constipation.  Had a wonderful 61th birthday party this past weekend.  About 100 people showed up for the party.  He has had no issues with headache.  He has had no vomiting.  Maybe a little bit of nausea.  I do not think there is been much in the way of leg swelling.  Overall, I would have to say that his performance status right now is probably ECOG 2.   Medications:  Allergies as of 08/12/2021       Reactions   Albuterol Other (See Comments)   Patient has had episode of atrial fib following administration of albuterol (tolerates Xopenex well)   Atorvastatin Other (See Comments)   Cannot take at this time        Medication List        Accurate as of August 12, 2021  9:29 AM. If you have any questions, ask your nurse or doctor.          acetaminophen 500 MG tablet Commonly known as: TYLENOL Take 500-1,000 mg by mouth every 6 (six) hours as needed for mild  pain.   amiodarone 200 MG tablet Commonly known as: Pacerone 400 mg (2 tabs) p.o. twice daily for 3 days, followed by 200 mg p.o. twice daily for 2 weeks starting 08/10/2021, followed by 200 mg p.o. daily starting 08/24/2021.   apixaban 5 MG Tabs tablet Commonly known as: Eliquis Take 1 tablet (5 mg total) by mouth 2 (two) times daily.   benzonatate 200 MG capsule Commonly known as: TESSALON Take 1 capsule (200 mg total) by mouth 3 (three) times daily as needed for cough.   metoprolol succinate 50 MG 24 hr tablet Commonly known as: TOPROL-XL Take 1 tablet (50 mg total) by mouth daily. Take with or immediately following a meal.   PARoxetine 10 MG tablet Commonly known as: PAXIL Take 10 mg by mouth at bedtime.   predniSONE 10 MG tablet Commonly known as: DELTASONE Take 4 tablets (40 mg total) by mouth daily with breakfast for 21 days, THEN 3 tablets (30 mg total) daily with breakfast for 5 days, THEN 2 tablets (20 mg total) daily with breakfast for 5 days, THEN 1 tablet (10 mg total) daily with breakfast for 5 days. Start taking on: July 26, 2021   PriLOSEC OTC 20 MG tablet Generic drug: omeprazole Take 20  mg by mouth daily before breakfast.        Allergies:  Allergies  Allergen Reactions   Albuterol Other (See Comments)    Patient has had episode of atrial fib following administration of albuterol (tolerates Xopenex well)   Atorvastatin Other (See Comments)    Cannot take at this time    Past Medical History, Surgical history, Social history, and Family History were reviewed and updated.  Review of Systems: Review of Systems  Constitutional:  Positive for malaise/fatigue.  HENT: Negative.    Eyes: Negative.   Respiratory:  Positive for cough and shortness of breath.   Cardiovascular:  Positive for chest pain.  Gastrointestinal: Negative.   Genitourinary: Negative.   Musculoskeletal:  Positive for joint pain.  Skin: Negative.   Neurological:  Positive for focal  weakness.  Endo/Heme/Allergies: Negative.   Psychiatric/Behavioral: Negative.       Physical Exam:  weight is 229 lb (103.9 kg). His oral temperature is 98.2 F (36.8 C). His blood pressure is 101/68 and his pulse is 70. His respiration is 18 and oxygen saturation is 95%.   Wt Readings from Last 3 Encounters:  08/12/21 229 lb (103.9 kg)  08/02/21 229 lb 14.4 oz (104.3 kg)  07/29/21 231 lb 4 oz (104.9 kg)  Vital signs are temperature of 98.4.  Pulse 94.  Blood pressure 116/75.  Weight is 233 pounds.  Physical Exam Vitals reviewed.  HENT:     Head: Normocephalic and atraumatic.  Eyes:     Pupils: Pupils are equal, round, and reactive to light.  Cardiovascular:     Rate and Rhythm: Normal rate and regular rhythm.     Heart sounds: Normal heart sounds.  Pulmonary:     Effort: Pulmonary effort is normal.     Breath sounds: Normal breath sounds.  Abdominal:     General: Bowel sounds are normal.     Palpations: Abdomen is soft.  Musculoskeletal:        General: No tenderness or deformity. Normal range of motion.     Cervical back: Normal range of motion.  Lymphadenopathy:     Cervical: No cervical adenopathy.  Skin:    General: Skin is warm and dry.     Findings: No erythema or rash.  Neurological:     Mental Status: He is alert and oriented to person, place, and time.  Psychiatric:        Behavior: Behavior normal.        Thought Content: Thought content normal.        Judgment: Judgment normal.    Lab Results  Component Value Date   WBC 7.3 08/12/2021   HGB 11.8 (L) 08/12/2021   HCT 36.3 (L) 08/12/2021   MCV 86.6 08/12/2021   PLT 172 08/12/2021   No results found for: "FERRITIN", "IRON", "TIBC", "UIBC", "IRONPCTSAT" Lab Results  Component Value Date   RBC 4.19 (L) 08/12/2021   No results found for: "KPAFRELGTCHN", "LAMBDASER", "KAPLAMBRATIO" No results found for: "IGGSERUM", "IGA", "IGMSERUM" No results found for: "TOTALPROTELP", "ALBUMINELP", "A1GS", "A2GS",  "BETS", "BETA2SER", "GAMS", "MSPIKE", "SPEI"   Chemistry      Component Value Date/Time   NA 137 08/12/2021 0844   K 3.2 (L) 08/12/2021 0844   CL 100 08/12/2021 0844   CO2 28 08/12/2021 0844   BUN 13 08/12/2021 0844   CREATININE 0.97 08/12/2021 0844      Component Value Date/Time   CALCIUM 9.1 08/12/2021 0844   ALKPHOS 64 08/12/2021 0844   AST  11 (L) 08/12/2021 0844   ALT 16 08/12/2021 0844   BILITOT 0.5 08/12/2021 0844       Impression and Plan: Mr. Moan is a very pleasant 61 yo gentleman with metastatic squamous cell carcinoma of the right lung. Tumor had high PD-L1 score.   We will now try him on chemotherapy with gemcitabine/Navelbine.  I think this would be a reasonable combination for him.  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  We do not want to test this again.  He also had problems with recurrent pneumothoraces.  He will finish up his radiation therapy I think next week.  I then we will plan to give him a week off and that we can try him on chemotherapy.  Again I think the Navelbine/gemcitabine would be reasonable.  I think we could tolerate this.  I would probably try to give him 3 or 4 cycles and then repeat the scans and see how everything looks.  I realize that we do not have a lot of options left.  I know he is trying his best.  He has great support from his family.  I really have to get his wife a lot of credit for helping him so much.  She is incredibly dedicated.  We will try to get him started in July.  Again I really would like to give him a couple weeks off.  Again he is getting radiation right now.  I will like to see him back when he starts his treatment.    Volanda Napoleon, MD 6/28/20239:29 AM

## 2021-08-12 NOTE — Progress Notes (Signed)
Primary Care Physician: [name], MD Primary Cardiologist: Dr Radford Pax Primary Electrophysiologist: none Referring Physician: Dr Lenny Pastel Hollopeter is a 61 y.o. male with a history of stage IIIb/IV lung cancer, RA, and atrial fibrillation who presents for follow up in the [facility].  The patient was initially diagnosed with atrial fibrillation 04/2020 after presenting to the ED with generalized weakness. He had been started on systemic chemotherapy recently PTA.  He was noted to be tachycardic in rapid atrial fibrillation.  He was hospitalized for further management of acute C diff diarrhea, sepsis and febrile neutropenia. He was started on amiodarone which converted him to SR. Anticoagulation was not started given elevated bleeding risk with tumor pressing on pulmonary artery. Patient has a CHADS2VASC score of 3. Patient has not had any recurrence of heart racing and feels "great". There was concern about lung toxicity with either amiodarone or Keytruda on CT 07/18/20 and his amiodarone was discontinued.   Patient was hospitalized again 09/2020 with neutropenic fever with pseudomonal bacteremia/sepsis. During the admission, he developed recurrent afib. After d/w pulmonology, amiodarone was resumed and he underwent DCCV on 10/13/20.   Patient was hospitalized with acute pneumonitis vs community acquired pneumonia. He presented 11/7 with progressive worsening SOB, productive cough with sputum and fatigue despite tx with Augmentin, steroids and supplemental oxygen. He was found hypoxic and required 15 L NRB. He was admitted for sepsis with acute hypoxic respiratory failure 2nd to CAP vs drug induced pneumonitis from [facility].  CTA of chest without PE. He was treated with IV steroids and broad spectrum antibiotics. Patient was initially followed by pulmonary who recommended to continue amiodarone 200mg  daily.  He was also followed by oncology. He was in sinus rhythm on  admit. Went into afib RVR on 11/12. His Toprol XL increased to 100mg  daily from 25mg  PTA. His Xarelto changed to Eliquis 2.5mg  BID due to incompatibility with itraconazole.   On follow up today, patient hospitalized 08/01/21 with afib with elevated heart rates. His amiodarone had been discontinued due to concerns about lung adverse effects. He did have symptoms of lightheadedness and palpitations while in afib. After a long discussion with cardiology team about risks and benefits of amiodarone, patient opted to resume the medication. He is in SR today and feeling much better.   Today, he denies symptoms of palpitations, chest pain, orthopnea, PND, lower extremity edema, dizziness, presyncope, syncope, snoring, daytime somnolence, bleeding, or neurologic sequela. The patient is tolerating medications without difficulties and is otherwise without complaint today.    Atrial Fibrillation Risk Factors:  he does not have symptoms or diagnosis of sleep apnea. he does not have a history of rheumatic fever. The patient does have a history of early familial atrial fibrillation or other arrhythmias. Mother has afib.  he has a BMI of Body mass index is 31.8 kg/m.[name] Filed Weights   08/13/21 0829  Weight: 103.4 kg     Family History  Problem Relation Age of Onset   Arrhythmia Mother        has PPM   Heart disease Father        Died at 30, started in his 2s, heart attacks, had PPM and ICD     Atrial Fibrillation Management history:  Previous antiarrhythmic drugs: amiodarone  Previous cardioversions: 10/13/20 Previous ablations: none CHADS2VASC score: 3 Anticoagulation history: Xarelto, Eliquis   Past Medical History:  Diagnosis Date   Atrial fibrillation with RVR ([facility]) 05/12/2020   Dyspnea  Family history of adverse reaction to anesthesia    brother with seizures had episode under anesthesia.  Patient has seizures as well.   GERD (gastroesophageal reflux disease)    History of radiation  therapy 04/24/2020-05/16/2020   IMRT to right lung     Dr [name]   PAF (paroxysmal atrial fibrillation) ([place])    CHADS2VSAC score 0   Rheumatoid aortitis    Sepsis ([place]) 10/06/2020   Squamous cell lung cancer [facility])    Past Surgical History:  Procedure Laterality Date   BRONCHIAL BRUSHINGS  03/27/2020   Procedure: BRONCHIAL BRUSHINGS;  Surgeon: [name], MD;  Location: [place];  Service: Cardiopulmonary;;   BUBBLE STUDY  10/13/2020   Procedure: BUBBLE STUDY;  Surgeon: Pixie Casino, MD;  Location: [place];  Service: Cardiovascular;;   CARDIOVERSION N/A 10/13/2020   Procedure: CARDIOVERSION;  Surgeon: Pixie Casino, MD;  Location: [facility] ENDOSCOPY;  Service: Cardiovascular;  Laterality: N/A;   CATARACT EXTRACTION  2016   at [facility]  03/27/2020   Procedure: FINE NEEDLE ASPIRATION;  Surgeon: [name], MD;  Location: [place];  Service: Cardiopulmonary;;   HIP SURGERY Left    TEE WITHOUT CARDIOVERSION N/A 10/13/2020   Procedure: TRANSESOPHAGEAL ECHOCARDIOGRAM (TEE);  Surgeon: Pixie Casino, MD;  Location: [place];  Service: Cardiovascular;  Laterality: N/A;   VIDEO BRONCHOSCOPY WITH ENDOBRONCHIAL ULTRASOUND N/A 03/27/2020   Procedure: VIDEO BRONCHOSCOPY WITH ENDOBRONCHIAL ULTRASOUND;  Surgeon: [name], MD;  Location: [place];  Service: Cardiopulmonary;  Laterality: N/A;    Current Outpatient Medications  Medication Sig Dispense Refill   acetaminophen (TYLENOL) 500 MG tablet Take 500-1,000 mg by mouth every 6 (six) hours as needed for mild pain.     amiodarone (PACERONE) 200 MG tablet 400 mg (2 tabs) p.o. twice daily for 3 days, followed by 200 mg p.o. twice daily for 2 weeks starting 08/10/2021, followed by 200 mg p.o. daily starting 08/24/2021. 70 tablet 0   apixaban (ELIQUIS) 5 MG TABS tablet Take 1 tablet (5 mg total) by mouth 2 (two) times daily. 60 tablet 6   benzonatate (TESSALON) 200 MG capsule Take 1 capsule (200 mg  total) by mouth 3 (three) times daily as needed for cough. 60 capsule 1   metoprolol succinate (TOPROL-XL) 50 MG 24 hr tablet Take 1 tablet (50 mg total) by mouth daily. Take with or immediately following a meal. 30 tablet 0   PARoxetine (PAXIL) 10 MG tablet Take 10 mg by mouth at bedtime.     predniSONE (DELTASONE) 10 MG tablet Take 4 tablets (40 mg total) by mouth daily with breakfast for 21 days, THEN 3 tablets (30 mg total) daily with breakfast for 5 days, THEN 2 tablets (20 mg total) daily with breakfast for 5 days, THEN 1 tablet (10 mg total) daily with breakfast for 5 days. 114 tablet 0   PRILOSEC OTC 20 MG tablet Take 20 mg by mouth daily before breakfast.     No current facility-administered medications for this encounter.    Allergies  Allergen Reactions   Albuterol Other (See Comments)    Patient has had episode of atrial fib following administration of albuterol (tolerates Xopenex well)   Atorvastatin Other (See Comments)    Cannot take at this time    Social History   Socioeconomic History   Marital status: Married    Spouse name: Not on file   Number of children: Not on file   Years  of education: Not on file   Highest education level: Not on file  Occupational History   Not on file  Tobacco Use   Smoking status: Former    Packs/day: 1.00    Years: 30.00    Total pack years: 30.00    Types: Cigarettes    Quit date: 2018    Years since quitting: 5.4   Smokeless tobacco: Never   Tobacco comments:    Former smoker 10/28/2020  Vaping Use   Vaping Use: Never used  Substance and Sexual Activity   Alcohol use: Not Currently   Drug use: Never   Sexual activity: Not on file  Other Topics Concern   Not on file  Social History Narrative   ** Merged History Encounter **       Social Determinants of Health   Financial Resource Strain: Not on file  Food Insecurity: Not on file  Transportation Needs: Not on file  Physical Activity: Not on file  Stress: Not on  file  Social Connections: Not on file  Intimate Partner Violence: Not on file     ROS- All systems are reviewed and negative except as per the HPI above.  Physical Exam: Vitals:   08/13/21 0829  BP: 110/70  Pulse: 71  Weight: 103.4 kg  Height: 5\' 11"  (1.803 m)     GEN- The patient is a well appearing male, alert and oriented x 3 today.   HEENT-head normocephalic, atraumatic, sclera clear, conjunctiva pink, hearing intact, trachea midline. Lungs- Clear to ausculation bilaterally, normal work of breathing Heart- Regular rate and rhythm, no murmurs, rubs or gallops  GI- soft, NT, ND, + BS Extremities- no clubbing, cyanosis, or edema MS- no significant deformity or atrophy Skin- no rash or lesion Psych- euthymic mood, full affect Neuro- strength and sensation are intact   Wt Readings from Last 3 Encounters:  08/13/21 103.4 kg  08/12/21 103.9 kg  08/02/21 104.3 kg    EKG today demonstrates  SR, PACs, LAFB Vent. rate 71 BPM PR interval 156 ms QRS duration 88 ms QT/QTcB 426/462 ms  Echo 08/02/21 demonstrated   1. Left ventricular ejection fraction, by estimation, is 60 to 65%. The  left ventricle has normal function. Left ventricular endocardial border  not optimally defined to evaluate regional wall motion even with definity  contrast. Left ventricular diastolic function could not be evaluated.   2. Right ventricular systolic function was not well visualized. The right  ventricular size is not well visualized. Tricuspid regurgitation signal is  inadequate for assessing PA pressure.   3. The mitral valve is normal in structure. No evidence of mitral valve  regurgitation. No evidence of mitral stenosis.   4. The aortic valve is normal in structure. Aortic valve regurgitation is  not visualized. No aortic stenosis is present.   5. Aortic dilatation noted. There is mild dilatation of the aortic root,  measuring 40 mm.   6. The inferior vena cava is normal in size with  greater than 50%  respiratory variability, suggesting right atrial pressure of 3 mmHg.   Epic records are reviewed at length today  CHA2DS2-VASc Score = 3  The patient's score is based upon: CHF History: 0 HTN History: 0 Diabetes History: 0 Stroke History: 2 (possible spenic embolic event) Vascular Disease History: 1 Age Score: 0 Gender Score: 0    ASSESSMENT AND PLAN: 1. Persistent Atrial Fibrillation (ICD10:  I48.19) The patient's CHA2DS2-VASc score is 3, indicating a 3.2% annual risk of stroke.  Patient has chemically converted with amiodarone.  Continue amiodarone 200 mg BID for a total of 2 weeks then decrease to once daily. We again discussed possibly lung toxicities of amiodarone, patient is very clear in his decision to stay on the medication.  Continue Eliquis 5 mg BID Continue Toprol 25 mg daily   2. Secondary Hypercoagulable State (ICD10:  D68.69) The patient is at significant risk for stroke/thromboembolism based upon his CHA2DS2-VASc Score of 3.  Continue Rivaroxaban (Xarelto).   3. Non-small cell lung cancer Plans per pulmonology and oncology.  4. Drug-induced pneumonitis Keytruda stopped. Patient back on amiodarone, he voices understanding of lung toxicity risks.   Follow up with Christen Bame as scheduled. AF clinic in 6 months.    [facility] [address] [place], [place] [address] [phone] 08/13/2021 8:40 AM

## 2021-08-13 ENCOUNTER — Ambulatory Visit
Admission: RE | Admit: 2021-08-13 | Discharge: 2021-08-13 | Disposition: A | Discharge status: Home / Self Care | Payer: 59 | Source: Ambulatory Visit | Attending: Radiation Oncology | Admitting: Radiation Oncology

## 2021-08-13 ENCOUNTER — Encounter (HOSPITAL_COMMUNITY): Payer: Self-pay | Admitting: Physician Assistant

## 2021-08-13 ENCOUNTER — Ambulatory Visit (HOSPITAL_COMMUNITY)
Admission: RE | Admit: 2021-08-13 | Discharge: 2021-08-13 | Disposition: A | Discharge status: Home / Self Care | Payer: 59 | Source: Ambulatory Visit | Attending: Physician Assistant | Admitting: Physician Assistant

## 2021-08-13 ENCOUNTER — Encounter: Payer: Self-pay | Admitting: Internal Medicine

## 2021-08-13 ENCOUNTER — Encounter: Payer: Self-pay | Admitting: Hematology & Oncology

## 2021-08-13 ENCOUNTER — Other Ambulatory Visit: Payer: Self-pay

## 2021-08-13 VITALS — BP 110/70 | HR 71 | Ht 71.0 in | Wt 228.0 lb

## 2021-08-13 DIAGNOSIS — D6869 Other thrombophilia: Secondary | ICD-10-CM | POA: Insufficient documentation

## 2021-08-13 DIAGNOSIS — I4819 Other persistent atrial fibrillation: Secondary | ICD-10-CM | POA: Insufficient documentation

## 2021-08-13 DIAGNOSIS — Z79899 Other long term (current) drug therapy: Secondary | ICD-10-CM | POA: Insufficient documentation

## 2021-08-13 DIAGNOSIS — Z8701 Personal history of pneumonia (recurrent): Secondary | ICD-10-CM | POA: Diagnosis not present

## 2021-08-13 DIAGNOSIS — Z87891 Personal history of nicotine dependence: Secondary | ICD-10-CM | POA: Diagnosis not present

## 2021-08-13 DIAGNOSIS — C3490 Malignant neoplasm of unspecified part of unspecified bronchus or lung: Secondary | ICD-10-CM | POA: Diagnosis not present

## 2021-08-13 DIAGNOSIS — Z7901 Long term (current) use of anticoagulants: Secondary | ICD-10-CM | POA: Insufficient documentation

## 2021-08-13 DIAGNOSIS — Z8249 Family history of ischemic heart disease and other diseases of the circulatory system: Secondary | ICD-10-CM | POA: Diagnosis not present

## 2021-08-13 LAB — RAD ONC ARIA SESSION SUMMARY
Course Elapsed Days: 3
Plan Fractions Treated to Date: 4
Plan Prescribed Dose Per Fraction: 5 Gy
Plan Total Fractions Prescribed: 10
Plan Total Prescribed Dose: 50 Gy
Reference Point Dosage Given to Date: 20 Gy
Reference Point Session Dosage Given: 5 Gy
Session Number: 4

## 2021-08-13 MED ORDER — AMIODARONE HCL 200 MG PO TABS: 200.0000 mg | ORAL_TABLET | Freq: Every day | ORAL | 3 refills | Status: DC

## 2021-08-13 NOTE — Patient Instructions (Signed)
Amiodarone--  200 mg p.o. twice daily for 2 weeks starting 08/10/2021, followed by 200 mg p.o. daily starting 08/24/2021

## 2021-08-14 ENCOUNTER — Ambulatory Visit: Payer: 59

## 2021-08-14 ENCOUNTER — Telehealth: Payer: Self-pay | Admitting: Oncology

## 2021-08-14 NOTE — Telephone Encounter (Addendum)
Nash's wife, John [name], called and said John [name] is having severe pain in his left lower back today and wants to cancel treatment.  Also spoke to John [name] and he said he has been having pain in his right and central back.  On Tuesday he bent down to open his dryer and twisted to the side and felt something "pop"  He was sore on Wednesday and on Thursday afternoon, he turned to the side again while holding a pizza box and felt something pop.  The pain is now in his left side, central and right back.  When he is resting it at a 3/10 and a 10/10 with activity.  He thinks he needs to rest today and does not want to be seen.  He will call EMS to go the ED if he feels like it is worse.

## 2021-08-17 ENCOUNTER — Other Ambulatory Visit: Payer: Self-pay

## 2021-08-17 ENCOUNTER — Ambulatory Visit: Payer: 59

## 2021-08-17 ENCOUNTER — Ambulatory Visit
Admission: RE | Admit: 2021-08-17 | Discharge: 2021-08-17 | Disposition: A | Discharge status: Home / Self Care | Payer: 59 | Source: Ambulatory Visit | Attending: Radiation Oncology | Admitting: Radiation Oncology

## 2021-08-17 DIAGNOSIS — C7971 Secondary malignant neoplasm of right adrenal gland: Secondary | ICD-10-CM | POA: Insufficient documentation

## 2021-08-17 LAB — RAD ONC ARIA SESSION SUMMARY
Course Elapsed Days: 7
Plan Fractions Treated to Date: 5
Plan Prescribed Dose Per Fraction: 5 Gy
Plan Total Fractions Prescribed: 10
Plan Total Prescribed Dose: 50 Gy
Reference Point Dosage Given to Date: 25 Gy
Reference Point Session Dosage Given: 5 Gy
Session Number: 5

## 2021-08-19 ENCOUNTER — Other Ambulatory Visit: Payer: Self-pay

## 2021-08-19 ENCOUNTER — Ambulatory Visit
Admission: RE | Admit: 2021-08-19 | Discharge: 2021-08-19 | Disposition: A | Discharge status: Home / Self Care | Payer: 59 | Source: Ambulatory Visit | Attending: Radiation Oncology | Admitting: Radiation Oncology

## 2021-08-19 ENCOUNTER — Encounter: Payer: Self-pay | Admitting: Radiation Oncology

## 2021-08-19 DIAGNOSIS — C7971 Secondary malignant neoplasm of right adrenal gland: Secondary | ICD-10-CM | POA: Diagnosis not present

## 2021-08-19 LAB — RAD ONC ARIA SESSION SUMMARY
Course Elapsed Days: 9
Plan Fractions Treated to Date: 6
Plan Prescribed Dose Per Fraction: 5 Gy
Plan Total Fractions Prescribed: 10
Plan Total Prescribed Dose: 50 Gy
Reference Point Dosage Given to Date: 30 Gy
Reference Point Session Dosage Given: 5 Gy
Session Number: 6

## 2021-08-20 ENCOUNTER — Emergency Department (HOSPITAL_COMMUNITY): Payer: 59

## 2021-08-20 ENCOUNTER — Encounter: Payer: Self-pay | Admitting: Radiation Oncology

## 2021-08-20 ENCOUNTER — Ambulatory Visit: Payer: 59

## 2021-08-20 ENCOUNTER — Encounter (HOSPITAL_COMMUNITY): Payer: Self-pay | Admitting: *Deleted

## 2021-08-20 ENCOUNTER — Emergency Department (HOSPITAL_COMMUNITY)
Admission: EM | Admit: 2021-08-20 | Discharge: 2021-08-20 | Disposition: A | Discharge status: Home / Self Care | Payer: 59 | Attending: Emergency Medicine | Admitting: Emergency Medicine

## 2021-08-20 DIAGNOSIS — J449 Chronic obstructive pulmonary disease, unspecified: Secondary | ICD-10-CM | POA: Diagnosis not present

## 2021-08-20 DIAGNOSIS — Z85118 Personal history of other malignant neoplasm of bronchus and lung: Secondary | ICD-10-CM | POA: Insufficient documentation

## 2021-08-20 DIAGNOSIS — I2510 Atherosclerotic heart disease of native coronary artery without angina pectoris: Secondary | ICD-10-CM | POA: Insufficient documentation

## 2021-08-20 DIAGNOSIS — Z7952 Long term (current) use of systemic steroids: Secondary | ICD-10-CM | POA: Insufficient documentation

## 2021-08-20 DIAGNOSIS — S32030A Wedge compression fracture of third lumbar vertebra, initial encounter for closed fracture: Secondary | ICD-10-CM | POA: Diagnosis not present

## 2021-08-20 DIAGNOSIS — S22080A Wedge compression fracture of T11-T12 vertebra, initial encounter for closed fracture: Secondary | ICD-10-CM | POA: Insufficient documentation

## 2021-08-20 DIAGNOSIS — R0602 Shortness of breath: Secondary | ICD-10-CM | POA: Diagnosis not present

## 2021-08-20 DIAGNOSIS — Z7901 Long term (current) use of anticoagulants: Secondary | ICD-10-CM | POA: Diagnosis not present

## 2021-08-20 DIAGNOSIS — X58XXXA Exposure to other specified factors, initial encounter: Secondary | ICD-10-CM | POA: Insufficient documentation

## 2021-08-20 DIAGNOSIS — M545 Low back pain, unspecified: Secondary | ICD-10-CM

## 2021-08-20 DIAGNOSIS — S3992XA Unspecified injury of lower back, initial encounter: Secondary | ICD-10-CM | POA: Diagnosis present

## 2021-08-20 LAB — BASIC METABOLIC PANEL
Anion gap: 8 (ref 5–15)
BUN: 13 mg/dL (ref 6–20)
CO2: 28 mmol/L (ref 22–32)
Calcium: 9 mg/dL (ref 8.9–10.3)
Chloride: 103 mmol/L (ref 98–111)
Creatinine, Ser: 0.87 mg/dL (ref 0.61–1.24)
GFR, Estimated: >60 mL/min (ref >60)
Glucose, Bld: 146 mg/dL — ABNORMAL HIGH (ref 70–99)
Potassium: 4.1 mmol/L (ref 3.5–5.1)
Sodium: 139 mmol/L (ref 135–145)

## 2021-08-20 LAB — CBC WITH DIFFERENTIAL/PLATELET
Abs Immature Granulocytes: 0.16 10*3/uL — ABNORMAL HIGH (ref 0.00–0.07)
Basophils Absolute: 0 10*3/uL (ref 0.0–0.1)
Basophils Relative: 0 %
Eosinophils Absolute: 0 10*3/uL (ref 0.0–0.5)
Eosinophils Relative: 1 %
HCT: 37.3 % — ABNORMAL LOW (ref 39.0–52.0)
Hemoglobin: 12 g/dL — ABNORMAL LOW (ref 13.0–17.0)
Immature Granulocytes: 2 %
Lymphocytes Relative: 2 %
Lymphs Abs: 0.2 10*3/uL — ABNORMAL LOW (ref 0.7–4.0)
MCH: 28.2 pg (ref 26.0–34.0)
MCHC: 32.2 g/dL (ref 30.0–36.0)
MCV: 87.6 fL (ref 80.0–100.0)
Monocytes Absolute: 0.5 10*3/uL (ref 0.1–1.0)
Monocytes Relative: 6 %
Neutro Abs: 7.5 10*3/uL (ref 1.7–7.7)
Neutrophils Relative %: 89 %
Platelets: 234 10*3/uL (ref 150–400)
RBC: 4.26 MIL/uL (ref 4.22–5.81)
RDW: 18.2 % — ABNORMAL HIGH (ref 11.5–15.5)
WBC: 8.4 10*3/uL (ref 4.0–10.5)
nRBC: 0 % (ref 0.0–0.2)

## 2021-08-20 LAB — PROTIME-INR
INR: 1.2 (ref 0.8–1.2)
Prothrombin Time: 15.1 seconds (ref 11.4–15.2)

## 2021-08-20 LAB — TROPONIN I (HIGH SENSITIVITY)
Troponin I (High Sensitivity): 2 ng/L (ref <18)
Troponin I (High Sensitivity): 3 ng/L (ref <18)

## 2021-08-20 LAB — BRAIN NATRIURETIC PEPTIDE: B Natriuretic Peptide: 34.6 pg/mL (ref 0.0–100.0)

## 2021-08-20 MED ORDER — GADOBUTROL 1 MMOL/ML IV SOLN
10.0000 mL | Freq: Once | INTRAVENOUS | Status: AC | PRN
  Administered 2021-08-20: 10 mL via INTRAVENOUS

## 2021-08-20 MED ORDER — OXYCODONE-ACETAMINOPHEN 5-325 MG PO TABS
1.0000 | ORAL_TABLET | Freq: Once | ORAL | Status: AC
  Administered 2021-08-20: 1 via ORAL
  Filled 2021-08-20: qty 1

## 2021-08-20 MED ORDER — IOHEXOL 350 MG/ML SOLN
100.0000 mL | Freq: Once | INTRAVENOUS | Status: AC | PRN
  Administered 2021-08-20: 100 mL via INTRAVENOUS

## 2021-08-20 MED ORDER — OXYCODONE HCL 5 MG PO TABS: 5.0000 mg | ORAL_TABLET | Freq: Four times a day (QID) | ORAL | 0 refills | Status: DC | PRN

## 2021-08-20 NOTE — ED Provider Notes (Signed)
[facility] DEPT Provider Note   CSN: [id number] Arrival date & time: 08/20/21  1054     History  Chief Complaint  Patient presents with   Shortness of Breath   Back Pain    John [name] is a 61 y.o. male.   stage IV squamous cell lung cancer currently on chemotherapy and radiation, CAD, pulmonary fibrosis, COPD, arthritis, anemia,, obesity, on chronic steroids, chronic respiratory failure on 4 LPM at home presenting with worsening right-sided low back pain for the past 1 day.  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  Did injure his side against a washing machine last week but did not fall or hit the ground.  The pain is to his right low back with no radiation down his leg.  No weakness or numbness or tingling.  No bowel or bladder incontinence.  No fever or vomiting.  He feels increasingly short of breath due to the pain in his back and went up from 3 L to 5 L on his home O2.  States he has a chronic cough which is unchanged.  No chest pain.  No fever. No missed doses of Eliquis.  States he only takes Tylenol at home for pain and does not like pain medications due to intolerance in the past  The history is provided by the patient.  Shortness of Breath Associated symptoms: no abdominal pain, no chest pain, no fever, no headaches, no rash and no vomiting   Back Pain Associated symptoms: weakness   Associated symptoms: no abdominal pain, no chest pain, no dysuria, no fever and no headaches        Home Medications Prior to Admission medications   Medication Sig Start Date End Date Taking? Authorizing Provider  acetaminophen (TYLENOL) 500 MG tablet Take 500-1,000 mg by mouth every 6 (six) hours as needed for mild pain.    [provider]  amiodarone (PACERONE) 200 MG tablet Take 1 tablet (200 mg total) by mouth daily. 08/24/21   Fenton, Clint R, PA  apixaban (ELIQUIS) 5 MG TABS tablet Take 1  tablet (5 mg total) by mouth 2 (two) times daily. 01/05/21   Fenton, Clint R, PA  benzonatate (TESSALON) 200 MG capsule Take 1 capsule (200 mg total) by mouth 3 (three) times daily as needed for cough. 07/23/21   Martyn Ehrich, NP  metoprolol succinate (TOPROL-XL) 50 MG 24 hr tablet Take 1 tablet (50 mg total) by mouth daily. Take with or immediately following a meal. 08/08/21 09/07/21  Darliss Cheney, MD  PARoxetine (PAXIL) 10 MG tablet Take 10 mg by mouth at bedtime. 01/25/20   [provider]  predniSONE (DELTASONE) 10 MG tablet Take 4 tablets (40 mg total) by mouth daily with breakfast for 21 days, THEN 3 tablets (30 mg total) daily with breakfast for 5 days, THEN 2 tablets (20 mg total) daily with breakfast for 5 days, THEN 1 tablet (10 mg total) daily with breakfast for 5 days. 07/26/21 08/31/21  Martyn Ehrich, NP  PRILOSEC OTC 20 MG tablet Take 20 mg by mouth daily before breakfast. 11/13/19   [provider]  prochlorperazine (COMPAZINE) 10 MG tablet Take 1 tablet (10 mg total) by mouth every 6 (six) hours as needed (Nausea or vomiting). Patient not taking: Reported on 06/29/2021 01/29/21 07/20/21  Volanda Napoleon, MD      Allergies    Albuterol and Atorvastatin    Review  of Systems   Review of Systems  Constitutional:  Negative for activity change, appetite change and fever.  HENT:  Negative for congestion and rhinorrhea.   Respiratory:  Positive for shortness of breath. Negative for chest tightness.   Cardiovascular:  Negative for chest pain.  Gastrointestinal:  Negative for abdominal pain, nausea and vomiting.  Genitourinary:  Negative for dysuria and hematuria.  Musculoskeletal:  Positive for arthralgias, back pain and myalgias.  Skin:  Negative for rash.  Neurological:  Positive for weakness. Negative for dizziness, light-headedness and headaches.   all other systems are negative except as noted in the HPI and PMH.    Physical Exam Updated Vital Signs BP  121/77 (BP Location: Left Arm)   Pulse 74   Temp 98.1 F (36.7 C) (Oral)   Resp 18   Ht 5\' 11"  (1.803 m)   Wt 104.3 kg   SpO2 90%   BMI 32.08 kg/m  Physical Exam Vitals and nursing note reviewed.  Constitutional:      General: He is not in acute distress.    Appearance: He is well-developed. He is not ill-appearing.     Comments: Uncomfortable  HENT:     Head: Normocephalic and atraumatic.     Mouth/Throat:     Pharynx: No oropharyngeal exudate.  Eyes:     Conjunctiva/sclera: Conjunctivae normal.     Pupils: Pupils are equal, round, and reactive to light.  Neck:     Comments: No meningismus. Cardiovascular:     Rate and Rhythm: Normal rate and regular rhythm.     Heart sounds: Normal heart sounds. No murmur heard. Pulmonary:     Effort: Pulmonary effort is normal. No respiratory distress.     Breath sounds: Normal breath sounds.  Chest:     Chest wall: No tenderness.  Abdominal:     Palpations: Abdomen is soft.     Tenderness: There is no abdominal tenderness. There is no guarding or rebound.  Musculoskeletal:        General: Tenderness present. Normal range of motion.     Cervical back: Normal range of motion and neck supple.     Comments: Right paraspinal lumbar tenderness, no midline tenderness  5/5 strength in bilateral lower extremities. Ankle plantar and dorsiflexion intact. Great toe extension intact bilaterally. +2 DP and PT pulses. +1 patellar reflexes bilaterally. Unable to ambulate   Skin:    General: Skin is warm.  Neurological:     Mental Status: He is alert and oriented to person, place, and time.     Cranial Nerves: No cranial nerve deficit.     Motor: No abnormal muscle tone.     Coordination: Coordination normal.     Comments:  5/5 strength throughout. CN 2-12 intact.Equal grip strength.   Psychiatric:        Behavior: Behavior normal.     ED Results / Procedures / Treatments   Labs (all labs ordered are listed, but only abnormal results are  displayed) Labs Reviewed  CBC WITH DIFFERENTIAL/PLATELET - Abnormal; Notable for the following components:      Result Value   Hemoglobin 12.0 (*)    HCT 37.3 (*)    RDW 18.2 (*)    Lymphs Abs 0.2 (*)    Abs Immature Granulocytes 0.16 (*)    All other components within normal limits  BASIC METABOLIC PANEL - Abnormal; Notable for the following components:   Glucose, Bld 146 (*)    All other components within normal limits  BRAIN NATRIURETIC PEPTIDE  PROTIME-INR  TROPONIN I (HIGH SENSITIVITY)  TROPONIN I (HIGH SENSITIVITY)    EKG EKG Interpretation  Date/Time:  Thursday August 20 2021 11:27:08 EDT Ventricular Rate:  68 PR Interval:  56 QRS Duration: 95 QT Interval:  424 QTC Calculation: 451 R Axis:   -54 Text Interpretation: Sinus rhythm Ventricular bigeminy Short PR interval Left anterior fascicular block Low voltage, precordial leads RSR' in V1 or V2, right VCD or RVH No significant change was found Confirmed by Ezequiel Essex [phone]) on 08/20/2021 11:43:28 AM  Radiology CT ABDOMEN PELVIS W CONTRAST  Result Date: 08/20/2021 CLINICAL DATA:  Metastatic disease, worsening right lower back pain. Rule out retroperitoneal bleed . EXAM: CT ABDOMEN AND PELVIS WITH CONTRAST TECHNIQUE: Multidetector CT imaging of the abdomen and pelvis was performed using the standard protocol following bolus administration of intravenous contrast. RADIATION DOSE REDUCTION: This exam was performed according to the departmental dose-optimization program which includes automated exposure control, adjustment of the mA and/or kV according to patient size and/or use of iterative reconstruction technique. CONTRAST:  171mL OMNIPAQUE IOHEXOL 350 MG/ML SOLN COMPARISON:  None Available. FINDINGS: Lower chest: Chronic fibrosis/atelectasis of the right lung, not significantly changed. Hepatobiliary: No focal liver abnormality is seen. Rim calcified gallstone without gallbladder wall thickening, or biliary dilatation.  Pancreas: Moderate fatty infiltration of the pancreas. Spleen: Normal in size without focal abnormality. Adrenals/Urinary Tract: Large right adrenal heterogeneous mass measuring at least 4.2 x 5.6 x 5.6 cm, not significantly changed. Kidneys are unremarkable without evidence of nephrolithiasis or hydronephrosis. Urinary bladder is unremarkable. Stomach/Bowel: Stomach is within normal limits. Appendix appears normal. No evidence of bowel wall thickening, distention, or inflammatory changes. Vascular/Lymphatic: Aortic atherosclerosis. Unchanged infrarenal abdominal aortic aneurysm measuring up to 2.7 x 2.7 cm. Follow-up examination every 5 years is recommended. No enlarged abdominal or pelvic lymph nodes. Reproductive: Prostate is unremarkable. Other: Fat containing left inguinal hernia. No abdominopelvic ascites. Musculoskeletal: New compression deformity of the T12 vertebral body with approximately 50% anterior vertebral body height loss. There is also new superior endplate compression deformity of L3 vertebral body. Chronic superior endplate deformities of L2 and L5 are unchanged. IMPRESSION: 1. New compression deformity of T12 vertebral body with approximately 50% anterior vertebral body height loss. 2.  New superior endplate deformity of L3 vertebral body. 3. Stable appearance of the right adrenal mass measuring 4.2 x 5.6 cm. 4.  Additional chronic findings as above. Electronically Signed   By: Keane Police D.O.   On: 08/20/2021 14:01   CT Angio Chest PE W and/or Wo Contrast  Result Date: 08/20/2021 CLINICAL DATA:  Lower back pain.  Increased shortness of breath. EXAM: CT ANGIOGRAPHY CHEST WITH CONTRAST TECHNIQUE: Multidetector CT imaging of the chest was performed using the standard protocol during bolus administration of intravenous contrast. Multiplanar CT image reconstructions and MIPs were obtained to evaluate the vascular anatomy. RADIATION DOSE REDUCTION: This exam was performed according to the  departmental dose-optimization program which includes automated exposure control, adjustment of the mA and/or kV according to patient size and/or use of iterative reconstruction technique. CONTRAST:  139mL OMNIPAQUE IOHEXOL 350 MG/ML SOLN COMPARISON:  CT examination dated Jul 14, 2021 FINDINGS: Cardiovascular: Satisfactory opacification of the pulmonary arteries to the segmental level. No evidence of pulmonary embolism. Normal heart size. No pericardial effusion. Mediastinum/Nodes: No enlarged mediastinal, hilar, or axillary lymph nodes. Thyroid gland, trachea, and esophagus demonstrate no significant findings. Lungs/Pleura: Right perihilar fibrosis/atelectasis, unchanged from prior examination suggesting postradiation changes. Multiple small thin walled  cysts. Diffuse ground-glass attenuation of the lung parenchyma with bilateral peripheral fibrosis consistent with nonspecific interstitial pneumonia, unchanged. No evidence of pneumothorax or large pleural effusion. Upper Abdomen: Cholelithiasis without evidence of acute cholecystitis. Moderate pancreatic atrophy. No acute abnormality. Musculoskeletal: Osteopenia and mild degenerate disc disease of the thoracic spine. No acute osseous abnormality. Review of the MIP images confirms the above findings. IMPRESSION: 1.  No evidence of pulmonary embolism. 2. Fibrosis/cystic changes and atelectasis of the right hilar region, most consistent with postradiation changes, not significantly changed from prior examination. 3. 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 4.  No acute upper abdominal process. Aortic Atherosclerosis (ICD10-I70.0) and Emphysema (ICD10-J43.9). Electronically Signed   By: Keane Police D.O.   On: 08/20/2021 13:46   DG Chest Portable 1 View  Result Date: 08/20/2021 CLINICAL DATA:  Short of breath.  History of lung cancer EXAM: PORTABLE CHEST 1 VIEW COMPARISON:   08/02/2021.  Chest CT 07/14/2021 FINDINGS: Right perihilar and right lower lobe airspace disease with mild progression. Elevated right hemidiaphragm. No significant effusion or pneumothorax on the right Left lung remains clear.  Negative for heart failure. IMPRESSION: Mild progression of right lower lobe airspace disease. Electronically Signed   By: Franchot Gallo M.D.   On: 08/20/2021 12:03    Procedures Procedures    Medications Ordered in ED Medications - No data to display  ED Course/ Medical Decision Making/ A&P                           Medical Decision Making Amount and/or Complexity of Data Reviewed Labs: ordered. Radiology: ordered and independent interpretation performed. Decision-making details documented in ED Course. ECG/medicine tests: ordered and independent interpretation performed. Decision-making details documented in ED Course.  Risk Prescription drug management.  Metastatic lung cancer here with worsening right-sided low back pain causing increased shortness of breath.  Did have some blunt trauma last week.  No weakness, numbness or tingling.  No bladder or bladder incontinence.  He says he has a known adrenal gland metastasis which is where he is hurting. No known spinal metastasis   Chest x-ray is negative for pneumonia or pneumothorax.  Results reviewed and interpreted by me.  CT scan is obtained to rule out pulmonary embolism.  No evidence of pulmonary embolism.  Does show stable fibrotic changes of the lungs bilaterally Adrenal mass is stable without evidence of hemorrhage.  Does have new compression fractures at T12 and L3.  MRI shows 50% height loss of T12 and L3.  No evidence of cord compression or retropulsion.  Discussed with Dr. Ellene Route of neurosurgery who will review images and call back. TLSO brace is ordered.  Patient's pain is improved with oxycodone.  We will attempt to ambulate after he receives his brace.  Patient was hesitant to try any pain  medication but is agreeable to oxycodone currently. Discussed pain control, follow-up with PCP as well as neurosurgery.  Return precautions discussed. Care to be transferred to Dr. Tamera Punt at shift change.          Final Clinical Impression(s) / ED Diagnoses Final diagnoses:  Acute right-sided low back pain without sciatica  Compression fracture of L3 lumbar vertebra, closed, initial encounter ([place])  Compression fracture of T12 vertebra, initial encounter [facility])    Rx / [place] Orders ED Discharge Orders     None         Jancarlos Thrun, Annie Main,  MD 08/20/21 1559

## 2021-08-20 NOTE — ED Provider Notes (Signed)
Care was taken over from Dr. Ellene Route awaiting TLSO brace.  Patient was diagnosed with 2 compression fractures in his mid and lower back.  MRI showed the compression fractures but no metastatic lesions.  Dr. Ellene Route was consulted and believes that patient can be discharged home with pain control.  He will follow the patient up in his office within the next few days and discuss possible kyphoplasty.  Patient was fitted for a TLSO and is able to ambulate short distances.  He ambulated down the hall and then back to his room.  He was given a prescription for oxycodone per Dr. Wyvonnia Dusky.  Was given information about following up with Dr. Ellene Route.  Return precautions were given.   Malvin Johns, MD 08/20/21 [id number]

## 2021-08-20 NOTE — Discharge Instructions (Signed)
Your imaging shows fractures involving your thoracic T12 vertebrae and lumbar L3 vertebrae.  Wear the brace as prescribed and follow-up with the spine doctor.  Take the pain medication as prescribed.  Return to the ED with worsening pain, weakness, numbness, tingling, fever, unable to walk, fever or vomiting or other concerns

## 2021-08-20 NOTE — ED Notes (Signed)
Pt. Ambulated after TLSO back brace was placed. Patient made it half-way down the half and came back to his room after stating he needed a break and that he was done for the time being.  Pt. ECG leads removed d/t back brace placement. Heart rate and vitals are being assessed via monitor. Pt. Remains stable at this time, will continue plan of care.

## 2021-08-20 NOTE — ED Triage Notes (Addendum)
Lower back pain from treatment which has increased resulting in increased SHOB due to the pain. Pt is usually on 3 L [place] at home increased to 5 remains 89%

## 2021-08-20 NOTE — Progress Notes (Signed)
Orthopedic Tech Progress Note Patient Details:  John [name] 1960/02/16 [id number] TLSO Brace has been ordered from [facility]  Patient ID: John [name], male   DOB: 03-Jun-1960, 61 y.o.   MRN: [id number]  [name] 08/20/2021, 4:11 PM

## 2021-08-21 ENCOUNTER — Ambulatory Visit: Payer: 59

## 2021-08-21 ENCOUNTER — Other Ambulatory Visit: Payer: Self-pay | Admitting: Radiation Oncology

## 2021-08-21 ENCOUNTER — Telehealth: Payer: Self-pay

## 2021-08-21 DIAGNOSIS — C3491 Malignant neoplasm of unspecified part of right bronchus or lung: Secondary | ICD-10-CM

## 2021-08-21 MED ORDER — OXYCODONE-ACETAMINOPHEN 5-325 MG PO TABS: 1.0000 | ORAL_TABLET | ORAL | 0 refills | Status: DC | PRN

## 2021-08-21 NOTE — Telephone Encounter (Signed)
Received a call from patient stating that he continues to have severe abdominal and low back pain. Patient was in ER on 08/20/21 and was told he has some compression fractions to L3 and T12. States that Oxycodone 5mg  has not been effective since discharge. Patient states he was given Oxycodone-APAP 5-325mg  in ER which was effective.  Requesting prescription for Oxycodne- APAP, requesting medication be sent to [facility] on [address].

## 2021-08-23 ENCOUNTER — Inpatient Hospital Stay (HOSPITAL_COMMUNITY)
Admission: EM | Admit: 2021-08-23 | Discharge: 2021-08-29 | DRG: 543 | Disposition: A | Discharge status: Home / Self Care | Payer: 59 | Attending: Internal Medicine | Admitting: Internal Medicine

## 2021-08-23 ENCOUNTER — Other Ambulatory Visit: Payer: Self-pay

## 2021-08-23 ENCOUNTER — Encounter (HOSPITAL_COMMUNITY): Payer: Self-pay | Admitting: Emergency Medicine

## 2021-08-23 DIAGNOSIS — I480 Paroxysmal atrial fibrillation: Secondary | ICD-10-CM | POA: Diagnosis present

## 2021-08-23 DIAGNOSIS — T50905A Adverse effect of unspecified drugs, medicaments and biological substances, initial encounter: Secondary | ICD-10-CM

## 2021-08-23 DIAGNOSIS — Z7901 Long term (current) use of anticoagulants: Secondary | ICD-10-CM

## 2021-08-23 DIAGNOSIS — Z85118 Personal history of other malignant neoplasm of bronchus and lung: Secondary | ICD-10-CM

## 2021-08-23 DIAGNOSIS — K219 Gastro-esophageal reflux disease without esophagitis: Secondary | ICD-10-CM | POA: Diagnosis present

## 2021-08-23 DIAGNOSIS — I2510 Atherosclerotic heart disease of native coronary artery without angina pectoris: Secondary | ICD-10-CM | POA: Diagnosis present

## 2021-08-23 DIAGNOSIS — D630 Anemia in neoplastic disease: Secondary | ICD-10-CM | POA: Diagnosis present

## 2021-08-23 DIAGNOSIS — J449 Chronic obstructive pulmonary disease, unspecified: Secondary | ICD-10-CM | POA: Diagnosis present

## 2021-08-23 DIAGNOSIS — J9611 Chronic respiratory failure with hypoxia: Secondary | ICD-10-CM | POA: Diagnosis present

## 2021-08-23 DIAGNOSIS — S22080A Wedge compression fracture of T11-T12 vertebra, initial encounter for closed fracture: Secondary | ICD-10-CM | POA: Diagnosis present

## 2021-08-23 DIAGNOSIS — M069 Rheumatoid arthritis, unspecified: Secondary | ICD-10-CM | POA: Diagnosis present

## 2021-08-23 DIAGNOSIS — I4819 Other persistent atrial fibrillation: Secondary | ICD-10-CM | POA: Diagnosis present

## 2021-08-23 DIAGNOSIS — Z79899 Other long term (current) drug therapy: Secondary | ICD-10-CM

## 2021-08-23 DIAGNOSIS — Z8249 Family history of ischemic heart disease and other diseases of the circulatory system: Secondary | ICD-10-CM

## 2021-08-23 DIAGNOSIS — E669 Obesity, unspecified: Secondary | ICD-10-CM | POA: Diagnosis present

## 2021-08-23 DIAGNOSIS — C3491 Malignant neoplasm of unspecified part of right bronchus or lung: Secondary | ICD-10-CM | POA: Diagnosis present

## 2021-08-23 DIAGNOSIS — M4854XA Collapsed vertebra, not elsewhere classified, thoracic region, initial encounter for fracture: Principal | ICD-10-CM | POA: Diagnosis present

## 2021-08-23 DIAGNOSIS — J984 Other disorders of lung: Secondary | ICD-10-CM | POA: Diagnosis present

## 2021-08-23 DIAGNOSIS — E559 Vitamin D deficiency, unspecified: Secondary | ICD-10-CM | POA: Diagnosis present

## 2021-08-23 DIAGNOSIS — M549 Dorsalgia, unspecified: Secondary | ICD-10-CM | POA: Diagnosis not present

## 2021-08-23 DIAGNOSIS — T380X5A Adverse effect of glucocorticoids and synthetic analogues, initial encounter: Secondary | ICD-10-CM | POA: Diagnosis present

## 2021-08-23 DIAGNOSIS — S22080D Wedge compression fracture of T11-T12 vertebra, subsequent encounter for fracture with routine healing: Secondary | ICD-10-CM | POA: Diagnosis not present

## 2021-08-23 DIAGNOSIS — J189 Pneumonia, unspecified organism: Secondary | ICD-10-CM | POA: Diagnosis not present

## 2021-08-23 DIAGNOSIS — Z6832 Body mass index (BMI) 32.0-32.9, adult: Secondary | ICD-10-CM

## 2021-08-23 DIAGNOSIS — E782 Mixed hyperlipidemia: Secondary | ICD-10-CM | POA: Diagnosis present

## 2021-08-23 DIAGNOSIS — C7970 Secondary malignant neoplasm of unspecified adrenal gland: Secondary | ICD-10-CM | POA: Diagnosis present

## 2021-08-23 DIAGNOSIS — Z888 Allergy status to other drugs, medicaments and biological substances status: Secondary | ICD-10-CM

## 2021-08-23 DIAGNOSIS — Z87891 Personal history of nicotine dependence: Secondary | ICD-10-CM

## 2021-08-23 DIAGNOSIS — Z9221 Personal history of antineoplastic chemotherapy: Secondary | ICD-10-CM

## 2021-08-23 DIAGNOSIS — D649 Anemia, unspecified: Secondary | ICD-10-CM | POA: Diagnosis present

## 2021-08-23 LAB — COMPREHENSIVE METABOLIC PANEL
ALT: 19 U/L (ref 0–44)
AST: 13 U/L — ABNORMAL LOW (ref 15–41)
Albumin: 3 g/dL — ABNORMAL LOW (ref 3.5–5.0)
Alkaline Phosphatase: 69 U/L (ref 38–126)
Anion gap: 8 (ref 5–15)
BUN: 14 mg/dL (ref 6–20)
CO2: 29 mmol/L (ref 22–32)
Calcium: 8.5 mg/dL — ABNORMAL LOW (ref 8.9–10.3)
Chloride: 98 mmol/L (ref 98–111)
Creatinine, Ser: 0.89 mg/dL (ref 0.61–1.24)
GFR, Estimated: >60 mL/min (ref >60)
Glucose, Bld: 164 mg/dL — ABNORMAL HIGH (ref 70–99)
Potassium: 4.4 mmol/L (ref 3.5–5.1)
Sodium: 135 mmol/L (ref 135–145)
Total Bilirubin: 0.8 mg/dL (ref 0.3–1.2)
Total Protein: 6.4 g/dL — ABNORMAL LOW (ref 6.5–8.1)

## 2021-08-23 LAB — CBC WITH DIFFERENTIAL/PLATELET
Abs Immature Granulocytes: 0.11 10*3/uL — ABNORMAL HIGH (ref 0.00–0.07)
Basophils Absolute: 0 10*3/uL (ref 0.0–0.1)
Basophils Relative: 0 %
Eosinophils Absolute: 0 10*3/uL (ref 0.0–0.5)
Eosinophils Relative: 0 %
HCT: 35.3 % — ABNORMAL LOW (ref 39.0–52.0)
Hemoglobin: 11.7 g/dL — ABNORMAL LOW (ref 13.0–17.0)
Immature Granulocytes: 2 %
Lymphocytes Relative: 3 %
Lymphs Abs: 0.2 10*3/uL — ABNORMAL LOW (ref 0.7–4.0)
MCH: 28.7 pg (ref 26.0–34.0)
MCHC: 33.1 g/dL (ref 30.0–36.0)
MCV: 86.5 fL (ref 80.0–100.0)
Monocytes Absolute: 0.4 10*3/uL (ref 0.1–1.0)
Monocytes Relative: 5 %
Neutro Abs: 6.5 10*3/uL (ref 1.7–7.7)
Neutrophils Relative %: 90 %
Platelets: 191 10*3/uL (ref 150–400)
RBC: 4.08 MIL/uL — ABNORMAL LOW (ref 4.22–5.81)
RDW: 17.4 % — ABNORMAL HIGH (ref 11.5–15.5)
WBC: 7.2 10*3/uL (ref 4.0–10.5)
nRBC: 0 % (ref 0.0–0.2)

## 2021-08-23 LAB — URINALYSIS, COMPLETE (UACMP) WITH MICROSCOPIC
Bacteria, UA: NONE SEEN
Bilirubin Urine: NEGATIVE
Glucose, UA: NEGATIVE mg/dL
Hgb urine dipstick: NEGATIVE
Ketones, ur: NEGATIVE mg/dL
Leukocytes,Ua: NEGATIVE
Nitrite: NEGATIVE
Protein, ur: NEGATIVE mg/dL
Specific Gravity, Urine: 1.02 (ref 1.005–1.030)
pH: 6 (ref 5.0–8.0)

## 2021-08-23 LAB — MAGNESIUM: Magnesium: 2 mg/dL (ref 1.7–2.4)

## 2021-08-23 LAB — IRON AND TIBC
Iron: 32 ug/dL — ABNORMAL LOW (ref 45–182)
Saturation Ratios: 16 % — ABNORMAL LOW (ref 17.9–39.5)
TIBC: 200 ug/dL — ABNORMAL LOW (ref 250–450)
UIBC: 168 ug/dL

## 2021-08-23 LAB — RETICULOCYTES
Immature Retic Fract: 16.6 % — ABNORMAL HIGH (ref 2.3–15.9)
RBC.: 3.84 MIL/uL — ABNORMAL LOW (ref 4.22–5.81)
Retic Count, Absolute: 74.1 10*3/uL (ref 19.0–186.0)
Retic Ct Pct: 1.9 % (ref 0.4–3.1)

## 2021-08-23 LAB — FERRITIN: Ferritin: 446 ng/mL — ABNORMAL HIGH (ref 24–336)

## 2021-08-23 LAB — FOLATE: Folate: 11.2 ng/mL (ref >5.9)

## 2021-08-23 LAB — PHOSPHORUS: Phosphorus: 4.2 mg/dL (ref 2.5–4.6)

## 2021-08-23 LAB — CK: Total CK: 13 U/L — ABNORMAL LOW (ref 49–397)

## 2021-08-23 LAB — VITAMIN B12: Vitamin B-12: 121 pg/mL — ABNORMAL LOW (ref 180–914)

## 2021-08-23 MED ORDER — AMIODARONE HCL 200 MG PO TABS
200.0000 mg | ORAL_TABLET | Freq: Once | ORAL | Status: AC
Start: 2021-08-23 — End: 2021-08-23
  Administered 2021-08-23: 200 mg via ORAL
  Filled 2021-08-23: qty 1

## 2021-08-23 MED ORDER — PREDNISONE 20 MG PO TABS
20.0000 mg | ORAL_TABLET | Freq: Every day | ORAL | Status: DC
  Administered 2021-08-24 – 2021-08-26 (×3): 20 mg via ORAL
  Filled 2021-08-23 (×3): qty 1

## 2021-08-23 MED ORDER — METHOCARBAMOL 1000 MG/10ML IJ SOLN: 500.0000 mg | Freq: Four times a day (QID) | INTRAVENOUS | Status: DC | PRN

## 2021-08-23 MED ORDER — OXYCODONE HCL 5 MG PO TABS
5.0000 mg | ORAL_TABLET | Freq: Four times a day (QID) | ORAL | Status: DC | PRN
  Administered 2021-08-23 – 2021-08-24 (×2): 5 mg via ORAL
  Filled 2021-08-23 (×2): qty 1

## 2021-08-23 MED ORDER — LIDOCAINE 5 % EX PTCH
1.0000 | MEDICATED_PATCH | CUTANEOUS | Status: DC
  Administered 2021-08-23 – 2021-08-26 (×4): 1 via TRANSDERMAL
  Filled 2021-08-23 (×7): qty 1

## 2021-08-23 MED ORDER — APIXABAN 5 MG PO TABS
5.0000 mg | ORAL_TABLET | Freq: Two times a day (BID) | ORAL | Status: DC
  Administered 2021-08-23: 5 mg via ORAL
  Filled 2021-08-23: qty 1

## 2021-08-23 MED ORDER — FENTANYL CITRATE PF 50 MCG/ML IJ SOSY
25.0000 ug | PREFILLED_SYRINGE | INTRAMUSCULAR | Status: DC | PRN
  Administered 2021-08-24 (×2): 25 ug via INTRAVENOUS
  Filled 2021-08-23 (×2): qty 1

## 2021-08-23 MED ORDER — AMIODARONE HCL 200 MG PO TABS
200.0000 mg | ORAL_TABLET | Freq: Every day | ORAL | Status: DC
  Administered 2021-08-24 – 2021-08-29 (×6): 200 mg via ORAL
  Filled 2021-08-23 (×6): qty 1

## 2021-08-23 MED ORDER — PAROXETINE HCL 10 MG PO TABS
10.0000 mg | ORAL_TABLET | Freq: Every day | ORAL | Status: DC
  Administered 2021-08-23 – 2021-08-28 (×6): 10 mg via ORAL
  Filled 2021-08-23 (×6): qty 1

## 2021-08-23 MED ORDER — LACTATED RINGERS IV BOLUS
1000.0000 mL | Freq: Once | INTRAVENOUS | Status: AC
  Administered 2021-08-23: 1000 mL via INTRAVENOUS

## 2021-08-23 MED ORDER — DICLOFENAC SODIUM 1 % EX GEL
4.0000 g | Freq: Four times a day (QID) | CUTANEOUS | Status: DC
Start: 2021-08-23 — End: 2021-08-29
  Administered 2021-08-23 – 2021-08-28 (×11): 4 g via TOPICAL
  Filled 2021-08-23: qty 100

## 2021-08-23 MED ORDER — METHOCARBAMOL 500 MG PO TABS
500.0000 mg | ORAL_TABLET | Freq: Four times a day (QID) | ORAL | Status: DC | PRN
  Administered 2021-08-23 – 2021-08-27 (×10): 500 mg via ORAL
  Filled 2021-08-23 (×10): qty 1

## 2021-08-23 MED ORDER — MORPHINE SULFATE (PF) 4 MG/ML IV SOLN
4.0000 mg | Freq: Once | INTRAVENOUS | Status: AC
  Administered 2021-08-23: 4 mg via INTRAVENOUS
  Filled 2021-08-23: qty 1

## 2021-08-23 MED ORDER — ONDANSETRON HCL 4 MG/2ML IJ SOLN
4.0000 mg | Freq: Once | INTRAMUSCULAR | Status: AC
  Administered 2021-08-23: 4 mg via INTRAVENOUS
  Filled 2021-08-23: qty 2

## 2021-08-23 NOTE — Assessment & Plan Note (Signed)
Pain control PT OT evaluation may need placement

## 2021-08-23 NOTE — Assessment & Plan Note (Addendum)
Monitor on telemetry Hold Toprol given soft blood per [place] continue amiodarone 200 mg daily continue Eliquis 5 mg twice daily

## 2021-08-23 NOTE — ED Triage Notes (Signed)
Per [facility] pt coming from home- has lung cancer and currently on radiation. Recently diagnosed with compression fractures and pain has been increasing over the past few weeks. Over past few days has been unable to care for himself and basic ADLS. On 3L [place] at all times. 150 mcg fentanyl given PTA.  Patient laying on left side for comfort. Patient states he was seen here Thursday then discharged however has been laying on the couch since then.

## 2021-08-23 NOTE — Assessment & Plan Note (Signed)
Chronic stable continue check anemia panel in a.m.

## 2021-08-23 NOTE — Subjective & Objective (Signed)
Was seen in the emergency department on 6 July FAF T12 compression fracture had a TLSO brace placed and was able to be discharged to home unfortunately when patient went home he was not able to tolerate pain spent most of the time sitting on the couch he has been trying to use oxycodone every 6 hours but does not seem to help.  He has been trying to lay down on his left side for comfort. 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 He is chronically on 3 L of nasal cannula which is chronic

## 2021-08-23 NOTE — Assessment & Plan Note (Signed)
Chronic restart beta-blocker when able to tolerate

## 2021-08-23 NOTE — Progress Notes (Deleted)
Cardiology Office Note:    Date:  08/23/2021   ID:  [name], DOB 10-Apr-1960, MRN [id number]  PCP:  [name], MD   [facility] Providers Cardiologist:  Fransico Him, MD     Referring MD: [name], MD   Chief Complaint: follow-up atrial fibrillation  History of Present Illness:    John [name] is a very pleasant  61 y.o. 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  Atrial fibrillation was diagnosed 04/2020.  He was started on amiodarone and converted to NSR.  Anticoagulation was not initially not started given his bleeding risk with tumor pressing on pulmonary artery.  CHA2DS2-VASc of 3.  There was concern about lung toxicity with either amiodarone or Keytruda on CT 07/18/2020 and amiodarone was stopped.  He had recurrent A-fib during hospitalization 09/2020.  Following discussion with pulmonary medicine amiodarone was resumed and he underwent cardioversion on 10/13/2020.  He was placed on Xarelto.  Coronary calcification 2 vessels on CT 09/2020.  Admission 12/2020 for sepsis with acute hypoxic respiratory failure requiring 15 L NRB 2/2 to CAP versus drug-induced pneumonitis from [facility]. CTA of chest without PE.  He was in sinus rhythm on admission.  Went into A-fib RVR on 12/27/2020.  Toprol XL increased to 100 mg daily.  Xarelto changed to Eliquis 2.5 mg twice daily due to incompatibility with itraconazole.   Seen on 11/19/20 by Ermalinda Barrios, PA. He has been followed [facility] and was last seen 03/09/2021 by Adline Peals, PA in [facility].  He was maintained on amiodarone 200 mg daily, Eliquis 5 mg twice daily, Toprol 25 mg daily and advised to follow-up in 6 months.  Admission 03/2021 for spontaneous pneumothorax, initial improvement with chest tube, return to ED with subcutaneous emphysema in setting of chest tube malfunction. Removal of chest  tube and monitoring through CXR, unfortunately found to have expansion of PTX and advised to return to ED 04/17/2021. Amiodarone has since been discontinued.   He was seen by me on 06/23/21. Amiodarone was stopped due to lung issues.  He cannot tell a difference in palpitations, symptoms of atrial fibrillation since stopping amiodarone.  Rarely has felt any symptoms of A-fib. Feeling well at that time. Persistent pneumothorax eventually resolved on its own. Has chronic dyspnea and had to stop on his way up to our office today due to the distance he had to park from the door.  O2 sat initially 90%, improved to 94% with rest. Continues treatment for lung cancer and uses oxygen at home as needed.  Previously took statin, felt that it increased his muscle pain during chemotherapy.  He denies chest pain, lower extremity edema, fatigue, palpitations, melena, hematuria, hemoptysis, diaphoresis, weakness, presyncope, syncope, orthopnea, and PND.  Admission 6 for interstitial lung disease felt to be 2/2 Keytruda, which was discontinued. Lipid clinic appointment 07/27/21, advised to start Kline but it was denied by insurance. Started Zetia 10 mg daily.   Admission 6/17-6/23/23 for a fib with RVR. He was symptomatic with a fib and anxious to have SR restored. Long discussion regarding amiodarone which patient chose to start. Transitioned from IV amiodarone to oral.   Return ED visits for ?fall, compression fracture T12 and L3 compression fractures, back pain  Today,   Past Medical History:  Diagnosis Date   Atrial fibrillation with RVR ([place]) 05/12/2020   Dyspnea    Family history of  adverse reaction to anesthesia    brother with seizures had episode under anesthesia.  Patient has seizures as well.   GERD (gastroesophageal reflux disease)    History of radiation therapy 04/24/2020-05/16/2020   IMRT to right lung     Dr [name]   PAF (paroxysmal atrial fibrillation) ([place])    CHADS2VSAC score 0   Rheumatoid  aortitis    Sepsis ([place]) 10/06/2020   Squamous cell lung cancer [facility])     Past Surgical History:  Procedure Laterality Date   BRONCHIAL BRUSHINGS  03/27/2020   Procedure: BRONCHIAL BRUSHINGS;  Surgeon: [name], MD;  Location: [place];  Service: Cardiopulmonary;;   BUBBLE STUDY  10/13/2020   Procedure: BUBBLE STUDY;  Surgeon: Pixie Casino, MD;  Location: [place];  Service: Cardiovascular;;   CARDIOVERSION N/A 10/13/2020   Procedure: CARDIOVERSION;  Surgeon: Pixie Casino, MD;  Location: [facility] ENDOSCOPY;  Service: Cardiovascular;  Laterality: N/A;   CATARACT EXTRACTION  2016   at [place]  03/27/2020   Procedure: FINE NEEDLE ASPIRATION;  Surgeon: [name], MD;  Location: [place];  Service: Cardiopulmonary;;   HIP SURGERY Left    TEE WITHOUT CARDIOVERSION N/A 10/13/2020   Procedure: TRANSESOPHAGEAL ECHOCARDIOGRAM (TEE);  Surgeon: Pixie Casino, MD;  Location: [place];  Service: Cardiovascular;  Laterality: N/A;   VIDEO BRONCHOSCOPY WITH ENDOBRONCHIAL ULTRASOUND N/A 03/27/2020   Procedure: VIDEO BRONCHOSCOPY WITH ENDOBRONCHIAL ULTRASOUND;  Surgeon: [name], MD;  Location: [place];  Service: Cardiopulmonary;  Laterality: N/A;    Current Medications: No outpatient medications have been marked as taking for the 08/28/21 encounter (Appointment) with Ann Maki, Lanice Schwab, NP.     Allergies:   Albuterol and Atorvastatin   Social History   Socioeconomic History   Marital status: Married    Spouse name: Not on file   Number of children: Not on file   Years of education: Not on file   Highest education level: Not on file  Occupational History   Not on file  Tobacco Use   Smoking status: Former    Packs/day: 1.00    Years: 30.00    Total pack years: 30.00    Types: Cigarettes    Quit date: 2018    Years since quitting: 5.5   Smokeless tobacco: Never   Tobacco comments:    Former smoker 10/28/2020  Vaping Use    Vaping Use: Never used  Substance and Sexual Activity   Alcohol use: Not Currently   Drug use: Never   Sexual activity: Not on file  Other Topics Concern   Not on file  Social History Narrative   ** Merged History Encounter **       Social Determinants of Health   Financial Resource Strain: Not on file  Food Insecurity: Not on file  Transportation Needs: Not on file  Physical Activity: Not on file  Stress: Not on file  Social Connections: Not on file     Family History: The patient's family history includes Arrhythmia in his mother; Heart disease in his father.  ROS:   Please see the history of present illness.    + chronic dyspnea All other systems reviewed and are negative.  Labs/Other Studies Reviewed:    The following studies were reviewed today:  2D Echo 08/02/21  1. Left ventricular ejection fraction, by estimation, is 60 to 65%. The  left ventricle has normal function. Left ventricular endocardial border  not optimally defined to evaluate  regional wall motion even with definity  contrast. Left ventricular  diastolic function could not be evaluated.   2. Right ventricular systolic function was not well visualized. The right  ventricular size is not well visualized. Tricuspid regurgitation signal is  inadequate for assessing PA pressure.   3. The mitral valve is normal in structure. No evidence of mitral valve  regurgitation. No evidence of mitral stenosis.   4. The aortic valve is normal in structure. Aortic valve regurgitation is  not visualized. No aortic stenosis is present.   5. Aortic dilatation noted. There is mild dilatation of the aortic root,  measuring 40 mm.   6. The inferior vena cava is normal in size with greater than 50%  respiratory variability, suggesting right atrial pressure of 3 mmHg.   TEE 10/13/20   1. Left ventricular ejection fraction, by estimation, is 60 to 65%. The  left ventricle has normal function.   2. Right ventricular  systolic function is normal. The right ventricular  size is normal.   3. No left atrial/left atrial appendage thrombus was detected.   4. The mitral valve is grossly normal. Trivial mitral valve  regurgitation.   5. The aortic valve is tricuspid. Aortic valve regurgitation is not  visualized.   6. Evidence of atrial level shunting detected by color flow Doppler.  Agitated saline contrast bubble study was positive with shunting observed  within 3-6 cardiac cycles suggestive of interatrial shunt. There is a  small patent foramen ovale with  bidirectional shunting across atrial septum.   Comparison(s): A prior study was performed on 05/12/2020. LVEF 60-65%, afib  was noted.   Conclusion(s)/Recommendation(s): No LA/LAA thrombus identified. Successful  cardioversion performed with restoration of normal sinus rhythm.   CT Chest 04/21/21    IMPRESSION: Trace residual right-sided pneumothorax anteriorly. Trace right pleural effusion.   Stable chronic pulmonary parenchymal changes of radiation treatment and fibrosis with cystic change in the right mid to upper lung.   Aortic Atherosclerosis (ICD10-I70.0) and Emphysema (ICD10-J43.9).   Recent Labs: 08/07/2021: Magnesium 2.1 08/12/2021: ALT 16; TSH 2.571 08/20/2021: B Natriuretic Peptide 34.6; BUN 13; Creatinine, Ser 0.87; Hemoglobin 12.0; Platelets 234; Potassium 4.1; Sodium 139  Recent Lipid Panel    Component Value Date/Time   CHOL 237 (H) 06/29/2021 0754   TRIG 121 06/29/2021 0754   HDL 31 (L) 06/29/2021 0754   CHOLHDL 7.6 06/29/2021 0754   VLDL 24 06/29/2021 0754   LDLCALC 182 (H) 06/29/2021 0754     Risk Assessment/Calculations:    CHA2DS2-VASc Score = 3    :1} This indicates a 3.2% annual risk of stroke. The patient's score is based upon: CHF History: 0 HTN History: 0 Diabetes History: 0 Stroke History: 2 (possible spenic embolic event) Vascular Disease History: 1 Age Score: 0 Gender Score: 0    Physical Exam:     VS:  There were no vitals taken for this visit.    Wt Readings from Last 3 Encounters:  08/20/21 230 lb (104.3 kg)  08/13/21 228 lb (103.4 kg)  08/12/21 229 lb (103.9 kg)     GEN:  Well nourished, well developed in no acute distress HEENT: Normal NECK: No JVD; No carotid bruits CARDIAC: RRR, no murmurs, rubs, gallops RESPIRATORY:  Clear to auscultation without rales, wheezing or rhonchi  ABDOMEN: Soft, non-tender, non-distended MUSCULOSKELETAL:  No edema; No deformity. 2+ pedal pulses, equal bilaterally SKIN: Warm and dry NEUROLOGIC:  Alert and oriented x 3 PSYCHIATRIC:  Normal affect   EKG:  EKG  is ***  Diagnoses:    No diagnosis found.  Assessment and Plan:    Persistent atrial fibrillation on chronic anticoagulation: Rate controlled on metoprolol succinate 25 mg daily.  He monitors home pulse, O2 sat.  Advised he may take additional metoprolol 25 mg for heart rate consistently > 100 bpm or for symptoms of atrial fibrillation. He favors follow-up at [facility] only for convenience. No bleeding problems on Eliquis.   Coronary calcification seen on CT/Aortic atherosclerosis: Calcification in LAD and left circumflex identified on CT.  He has had no chest pain.  Has chronic dyspnea in the setting of lung cancer.  No indication for further ischemic evaluation at this time.  He is not on aspirin in the setting of chronic anticoagulation. Continue metoprolol.  Starting Zetia for hyperlipidemia as noted below  Hyperlipidemia LDL goal < 70: LDL 94 on 10/10/2020.  We discussed the importance of keeping LDL below 70 due to coronary calcifications and aortic atherosclerosis on CT. Encouraged mostly plant-based, whole food diet low in saturated fat.  Had muscle aches on statin previously. He is agreeable to start Zetia 10 mg daily. He will request lipids to be checked by [name]. Advised that if that is not done we will recheck at next office visit.   Lung cancer: Currently  undergoing chemotherapy. Doing well. Followed by [name] with [facility].   Disposition: ***     Medication Adjustments/Labs and Tests Ordered: Current medicines are reviewed at length with the patient today.  Concerns regarding medicines are outlined above.  No orders of the defined types were placed in this encounter.  No orders of the defined types were placed in this encounter.   There are no Patient Instructions on file for this visit.   Signed, Emmaline Life, NP  08/23/2021 11:22 AM    [employer]

## 2021-08-23 NOTE — H&P (Signed)
John [name] MEB:[id number] DOB: [birth year] DOA: 08/23/2021     PCP: [name], MD   Outpatient Specialists:  CARDS:   Dr. Malka So, PA   Pulmonary  Dr.Byrum  Oncology   [name]   Radiation oncology Dr. Isidore Moos Patient arrived to ER on 08/23/21 at 1509 Referred by Attending Kommor, Debe Coder, MD   Patient coming from:    home Lives  With family    Chief Complaint:   Chief Complaint  Patient presents with   Back Pain    HPI: John [name] is a 61 y.o. male with medical history significant of metastatic lung cancer and atrial fibrillation, rheumatoid arthritis, history of C. difficile causing sepsis    Presented with   back Was seen in the emergency department on 6 July FAF T12 compression fracture had a TLSO brace placed and was able to be discharged to home unfortunately when patient went home he was not able to tolerate pain spent most of the time sitting on the couch he has been trying to use oxycodone every 6 hours but does not seem to help.  He has been trying to lay down on his left side for comfort. 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 He is chronically on 3 L of nasal cannula which is chronic 2 wks ago he bent over and heard a pop in his back  The pain is on the right side of his back where he has been getting radiation therapy   No tobacco no ETOH Has appointment with [facility] on Wednesday  Lab Results  Component Value Date   [place] 07/20/2021   [place] NEGATIVE 04/17/2021   [place] NEGATIVE 04/14/2021   [place] NEGATIVE 03/30/2021     Regarding pertinent Chronic problems:     Hyperlipidemia -does not tolerate statin Lipid Panel     Component Value Date/Time   CHOL 237 (H) 06/29/2021 0754   TRIG 121 06/29/2021 0754   HDL 31 (L) 06/29/2021 0754   CHOLHDL 7.6 06/29/2021 0754   VLDL 24 06/29/2021 0754   LDLCALC 182 (H) 06/29/2021 0754      HTN on Toprol     obesity-   BMI Readings from Last 1 Encounters:  08/23/21 32.08 kg/m     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 Fib -  - CHA2DS2 vas score     3 on Eliquis          -  Rate control:  Currently controlled with  Toprolol,           - Rhythm control: Has been on and off amiodarone multiple times with concerns for pulmonary issues and eventually was restarted      Chronic anemia - baseline hg Hemoglobin & Hematocrit  Recent Labs    08/12/21 0844 08/20/21 1139 08/23/21 1547  HGB 11.8* 12.0* 11.7*     While in ER:  Given doses of morphine with significant pain at this point being admitted for pain control   Imaging from the July 6 showing  MRI lumbar spine Acute to subacute T12 and L3 compression fractures with approximately 50% loss of vertebral body height. No definite evidence of underlying lesion. Given that there are other chronic compression fractures present, these are favored to be benign.  CXR - Mild progression of right lower lobe airspace disease.  CTabd/pelvis - New compression deformity of T12 vertebral body  with approximately 50% anterior vertebral body height loss. New superior endplate deformity of L3 vertebral body. Stable appearance of the right adrenal mass measuring 4.2 x 5.6 cm.  CTA chest -   no PE, Fibrosis/cystic changes and atelectasis of the right hilar region, most consistent with postradiation changes, not significantly changed from prior examination.  Following Medications were ordered in ER: Medications  lidocaine (LIDODERM) 5 % 1 patch (1 patch Transdermal Patch Applied 08/23/21 1555)  diclofenac Sodium (VOLTAREN) 1 % topical gel 4 g (4 g Topical Given 08/23/21 1759)  lactated ringers bolus 1,000 mL (0 mLs Intravenous Stopped 08/23/21 1733)  morphine (PF) 4 MG/ML injection 4 mg (4 mg Intravenous Given 08/23/21 1716)  ondansetron (ZOFRAN) injection 4 mg (4 mg Intravenous Given 08/23/21  1714)    _______    ED Triage Vitals  Enc Vitals Group     BP 08/23/21 1520 (!) 103/57     Pulse Rate 08/23/21 1520 77     Resp 08/23/21 1520 16     Temp 08/23/21 1522 98 F (36.7 C)     Temp Source 08/23/21 1522 Oral     SpO2 08/23/21 1517 95 %     Weight 08/23/21 1520 230 lb (104.3 kg)     Height 08/23/21 1520 5\' 11"  (1.803 m)     Head Circumference --      Peak Flow --      Pain Score 08/23/21 1520 0     Pain Loc --      Pain Edu? --      Excl. in [place]? --   TMAX(24)@     _________________________________________ Significant initial  Findings: Abnormal Labs Reviewed  COMPREHENSIVE METABOLIC PANEL - Abnormal; Notable for the following components:      Result Value   Glucose, Bld 164 (*)    Calcium 8.5 (*)    Total Protein 6.4 (*)    Albumin 3.0 (*)    AST 13 (*)    All other components within normal limits  CBC WITH DIFFERENTIAL/PLATELET - Abnormal; Notable for the following components:   RBC 4.08 (*)    Hemoglobin 11.7 (*)    HCT 35.3 (*)    RDW 17.4 (*)    Lymphs Abs 0.2 (*)    Abs Immature Granulocytes 0.11 (*)    All other components within normal limits  CK - Abnormal; Notable for the following components:   Total CK 13 (*)    All other components within normal limits  VITAMIN B12 - Abnormal; Notable for the following components:   Vitamin B-12 121 (*)    All other components within normal limits  IRON AND TIBC - Abnormal; Notable for the following components:   Iron 32 (*)    TIBC 200 (*)    Saturation Ratios 16 (*)    All other components within normal limits  FERRITIN - Abnormal; Notable for the following components:   Ferritin 446 (*)    All other components within normal limits  RETICULOCYTES - Abnormal; Notable for the following components:   RBC. 3.84 (*)    Immature Retic Fract 16.6 (*)    All other components within normal limits       ECG: Ordered Personally reviewed and interpreted by me showing: HR : 77 Rhythm: Sinus rhythm Consider  right ventricular hypertrophy Inferior infarct, old Consider anterior infarct QTC 464    The recent clinical data is shown below. Vitals:   08/23/21 1630 08/23/21 1700 08/23/21 1730 08/23/21 1800  BP: 106/63 107/65 100/62 108/64  Pulse: 68 71 73 73  Resp:  16  16  Temp:      TempSrc:      SpO2: 94% 95% 91% 93%  Weight:      Height:         WBC     Component Value Date/Time   WBC 7.2 08/23/2021 1547   LYMPHSABS 0.2 (L) 08/23/2021 1547   MONOABS 0.4 08/23/2021 1547   EOSABS 0.0 08/23/2021 1547   BASOSABS 0.0 08/23/2021 1547         UA  ordered   Urine analysis:    Component Value Date/Time   COLORURINE YELLOW 12/22/2020 1213   APPEARANCEUR CLEAR 12/22/2020 1213   LABSPEC 1.029 12/22/2020 1213   PHURINE 6.0 12/22/2020 1213   GLUCOSEU NEGATIVE 12/22/2020 1213   HGBUR NEGATIVE 12/22/2020 1213   BILIRUBINUR NEGATIVE 12/22/2020 1213   KETONESUR 5 (A) 12/22/2020 1213   PROTEINUR NEGATIVE 12/22/2020 1213   NITRITE NEGATIVE 12/22/2020 1213   LEUKOCYTESUR NEGATIVE 12/22/2020 1213     _______________________________________________ Hospitalist was called for admission for  T12 compression frx   The following Work up has been ordered so far:  Orders Placed This Encounter  Procedures   Comprehensive metabolic panel   CBC with Differential   Consult to hospitalist     OTHER Significant initial  Findings:  labs showing:    Recent Labs  Lab 08/20/21 1139 08/23/21 1547 08/23/21 1920  NA 139 135  --   K 4.1 4.4  --   CO2 28 29  --   GLUCOSE 146* 164*  --   BUN 13 14  --   CREATININE 0.87 0.89  --   CALCIUM 9.0 8.5*  --   MG  --   --  2.0  PHOS  --   --  4.2    Cr   stable,    Lab Results  Component Value Date   CREATININE 0.89 08/23/2021   CREATININE 0.87 08/20/2021   CREATININE 0.97 08/12/2021    Recent Labs  Lab 08/23/21 1547  AST 13*  ALT 19  ALKPHOS 69  BILITOT 0.8  PROT 6.4*  ALBUMIN 3.0*   Lab Results  Component Value Date    CALCIUM 8.5 (L) 08/23/2021   PHOS 4.2 08/23/2021        Plt: Lab Results  Component Value Date   PLT 191 08/23/2021     Recent Labs  Lab 08/20/21 1139 08/23/21 1547  WBC 8.4 7.2  NEUTROABS 7.5 6.5  HGB 12.0* 11.7*  HCT 37.3* 35.3*  MCV 87.6 86.5  PLT 234 191    HG/HCT   stable,      Component Value Date/Time   HGB 11.7 (L) 08/23/2021 1547   HGB 11.8 (L) 08/12/2021 0844   HCT 35.3 (L) 08/23/2021 1547   MCV 86.5 08/23/2021 1547      Cardiac Panel (last 3 results) Recent Labs    08/23/21 1920  CKTOTAL 13*    .car BNP (last 3 results) Recent Labs    07/20/21 2132 08/01/21 2301 08/20/21 1139  BNP 23.1 69.1 34.6    Cultures:    Component Value Date/Time   SDES  12/22/2020 1232    IN/OUT CATH URINE Performed at [facility], [place] [address] [place], [place] [address]    SPECREQUEST  12/22/2020 1232    NONE Performed at [facility], [place] [address], [place], [place] [address]    CULT  12/22/2020 1232    NO GROWTH Performed at [facility] [address] [place], [place] [address]    REPTSTATUS 12/23/2020 FINAL 12/22/2020 1232     Radiological Exams on Admission: No results found. _______________________________________________________________________________________________________ Latest  Blood pressure 108/64, pulse 73, temperature 98 F (36.7 C), temperature source Oral, resp. rate 16, height 5\' 11"  (1.803 m), weight 104.3 kg, SpO2 93 %.   Vitals  labs and radiology finding personally reviewed  Review of Systems:    Pertinent positives include: back pain  productive cough  Constitutional:  No weight loss, night sweats, Fevers, chills, fatigue, weight loss  HEENT:  No headaches, Difficulty swallowing,Tooth/dental problems,Sore throat,  No sneezing, itching, ear ache, nasal congestion, post nasal drip,  Cardio-vascular:  No chest pain, Orthopnea, PND, anasarca, dizziness, palpitations.no Bilateral  lower extremity swelling  GI:  No heartburn, indigestion, abdominal pain, nausea, vomiting, diarrhea, change in bowel habits, loss of appetite, melena, blood in stool, hematemesis Resp:  no shortness of breath at rest. No dyspnea on exertion, No excess mucus, no, No non-productive cough, No coughing up of blood  Skin:  no rash or lesions. No jaundice GU:  no dysuria, change in color of urine, no urgency or frequency. No straining to urinate.  No flank pain.  Musculoskeletal:  No joint pain or no joint swelling. No decreased range of motion. No back pain.  Psych:  No change in mood or affect. No depression or anxiety. No memory loss.  Neuro: no localizing neurological complaints, no tingling, no weakness, no double vision, no gait abnormality, no slurred speech, no confusion  All systems reviewed and apart from [place] all are negative _______________________________________________________________________________________________ Past Medical History:   Past Medical History:  Diagnosis Date   Atrial fibrillation with RVR ([place]) 05/12/2020   Dyspnea    Family history of adverse reaction to anesthesia    brother with seizures had episode under anesthesia.  Patient has seizures as well.   GERD (gastroesophageal reflux disease)    History of radiation therapy 04/24/2020-05/16/2020   IMRT to right lung     Dr [name]   PAF (paroxysmal atrial fibrillation) ([place])    CHADS2VSAC score 0   Rheumatoid aortitis    Sepsis ([place]) 10/06/2020   Squamous cell lung cancer [facility])       Past Surgical History:  Procedure Laterality Date   BRONCHIAL BRUSHINGS  03/27/2020   Procedure: BRONCHIAL BRUSHINGS;  Surgeon: [name], MD;  Location: [place];  Service: Cardiopulmonary;;   BUBBLE STUDY  10/13/2020   Procedure: BUBBLE STUDY;  Surgeon: Pixie Casino, MD;  Location: [place];  Service: Cardiovascular;;   CARDIOVERSION N/A 10/13/2020   Procedure: CARDIOVERSION;  Surgeon: Pixie Casino, MD;  Location: [facility] ENDOSCOPY;  Service: Cardiovascular;  Laterality: N/A;   CATARACT EXTRACTION  2016   at [place]  03/27/2020   Procedure: FINE NEEDLE ASPIRATION;  Surgeon: [name], MD;  Location: [place];  Service: Cardiopulmonary;;   HIP SURGERY Left    TEE WITHOUT CARDIOVERSION N/A 10/13/2020   Procedure: TRANSESOPHAGEAL ECHOCARDIOGRAM (TEE);  Surgeon: Pixie Casino, MD;  Location: [place];  Service: Cardiovascular;  Laterality: N/A;   VIDEO BRONCHOSCOPY WITH ENDOBRONCHIAL ULTRASOUND N/A 03/27/2020   Procedure: VIDEO BRONCHOSCOPY WITH ENDOBRONCHIAL ULTRASOUND;  Surgeon: [name], MD;  Location: [place];  Service: Cardiopulmonary;  Laterality: N/A;    Social History:  Ambulatory   independently      reports that  he quit smoking about 5 years ago. His smoking use included cigarettes. He has a 30.00 pack-year smoking history. He has never used smokeless tobacco. He reports that he does not currently use alcohol. He reports that he does not use drugs.    Family History:   Family History  Problem Relation Age of Onset   Arrhythmia Mother        has PPM   Heart disease Father        Died at 39, started in his 51s, heart attacks, had PPM and ICD   ______________________________________________________________________________________________ Allergies: Allergies  Allergen Reactions   Albuterol Other (See Comments)    Patient has had episode of atrial fib following administration of albuterol (tolerates Xopenex well)   Atorvastatin Other (See Comments)    Causes arthritis pain     Prior to Admission medications   Medication Sig Start Date End Date Taking? Authorizing Provider  acetaminophen (TYLENOL) 500 MG tablet Take 500-1,000 mg by mouth every 6 (six) hours as needed for mild pain.    [provider]  amiodarone (PACERONE) 200 MG tablet Take 1 tablet (200 mg total) by mouth daily. Patient taking differently: Take  200 mg by mouth See admin instructions. Ordered at discharge (08/07/21) - take one tablet (200 mg) twice daily, on 08/24/21 change to one tablet once daily 08/24/21   Fenton, Clint R, PA  apixaban (ELIQUIS) 5 MG TABS tablet Take 1 tablet (5 mg total) by mouth 2 (two) times daily. 01/05/21   Fenton, Clint R, PA  benzonatate (TESSALON) 200 MG capsule Take 1 capsule (200 mg total) by mouth 3 (three) times daily as needed for cough. 07/23/21   Martyn Ehrich, NP  metoprolol succinate (TOPROL-XL) 50 MG 24 hr tablet Take 1 tablet (50 mg total) by mouth daily. Take with or immediately following a meal. 08/08/21 09/07/21  Darliss Cheney, MD  omeprazole (PRILOSEC OTC) 20 MG tablet Take 20 mg by mouth at bedtime.    [provider]  oxyCODONE (ROXICODONE) 5 MG immediate release tablet Take 1 tablet (5 mg total) by mouth every 6 (six) hours as needed for severe pain. 08/20/21   Rancour, Annie Main, MD  oxyCODONE-acetaminophen (PERCOCET/ROXICET) 5-325 MG tablet Take 1 tablet by mouth every 4 (four) hours as needed for severe pain. 08/21/21   Eppie Gibson, MD  PARoxetine (PAXIL) 10 MG tablet Take 10 mg by mouth at bedtime. 01/25/20   [provider]  predniSONE (DELTASONE) 10 MG tablet Take 4 tablets (40 mg total) by mouth daily with breakfast for 21 days, THEN 3 tablets (30 mg total) daily with breakfast for 5 days, THEN 2 tablets (20 mg total) daily with breakfast for 5 days, THEN 1 tablet (10 mg total) daily with breakfast for 5 days. 07/26/21 08/31/21  Martyn Ehrich, NP  prochlorperazine (COMPAZINE) 10 MG tablet Take 1 tablet (10 mg total) by mouth every 6 (six) hours as needed (Nausea or vomiting). Patient not taking: Reported on 06/29/2021 01/29/21 07/20/21  Volanda Napoleon, MD    ___________________________________________________________________________________________________ Physical Exam:    08/23/2021    6:00 PM 08/23/2021    5:30 PM 08/23/2021    5:00 PM  Vitals with BMI  Systolic [phone]   Diastolic 64 62 65  Pulse 73 73 71     1. General:  in No  Acute distress   Chronically ill appearing 2. Psychological: Alert and   Oriented 3. Head/ENT:     Dry Mucous Membranes  Head Non traumatic, neck supple                            Poor Dentition 4. SKIN:  decreased Skin turgor,  Skin clean Dry and intact no rash 5. Heart: Regular rate and rhythm no  Murmur, no Rub or gallop 6. Lungs:  no wheezes or crackles   7. Abdomen: Soft,  non-tender, Non distended   obese  bowel sounds present 8. Lower extremities: no clubbing, cyanosis, no  edema 9. Neurologically Grossly intact, moving all 4 extremities equally   10. MSK: Normal range of motion  Right side back pain   Chart has been reviewed  ______________________________________________________________________________________________  Assessment/Plan  61 y.o. male with medical history significant of metastatic lung cancer and atrial fibrillation, rheumatoid arthritis, history of C. 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  Coronary artery disease involving native coronary artery of native heart without angina pectoris Chronic restart beta-blocker when able to tolerate  Stage IV squamous cell carcinoma of  right lung (HCC) We will need to email [name] the patient's been admitted Patient's been followed by radiation oncology  Mixed hyperlipidemia Does not tolerate statins  T12 compression fracture (HCC) Pain control PT OT evaluation may need placement  COPD with asthma ([place]) Chronic stable on baseline oxygen we will continue Continue prednisone 20 mg daily    Other plan as per orders.  DVT prophylaxis:  eliquis    Code Status:    Code Status: Prior FULL CODE   as per patient  I had personally discussed CODE STATUS with patient    Family Communication:   Family  at  Bedside  plan of care was discussed    with  Wife,   Disposition Plan:     likely will need placement for rehabilitation                        Following barriers for discharge:                          Pain meds                       Would benefit from PT/OT eval prior to DC  Ordered                                       Nutrition    consulted                                      Consults called: emailed [name] Dr.  [place]  Admission status:  ED Disposition     ED Disposition  Admit   Condition  --   Comment  Hospital [facility]: [facility] [[id number]]  Level of Care: Telemetry [5]  Admit to tele based on following criteria: Other see comments  Comments: a.fib  May place patient in observation at [facility] or [facility] if equivalent level of care is available:: No  Covid Evaluation: Asymptomatic - no recent exposure (last 10 days) testing not required  Diagnosis: T12 compression fracture [facility]) [[id number]]  Admitting Physician: Toy Baker [[id number]]  Attending Physician: Toy Baker [[id number]]           Obs      Level of care     tele  For  24H        Lab Results  Component Value Date   [place] NEGATIVE 07/20/2021     Lajada Janes 08/23/2021, 10:08 PM    [facility]     after 2 AM please page floor coverage PA If 7AM-7PM, please  contact the day team taking care of the patient using [email or web address]   Patient was evaluated in the context of the global COVID-19 pandemic, which necessitated consideration that the patient might be at risk for infection with the SARS-CoV-2 virus that causes COVID-19. Institutional protocols and algorithms that pertain to the evaluation of patients at risk for COVID-19 are in a state of rapid change based on information released by regulatory bodies including the CDC and federal and state organizations. These policies and algorithms were followed during the patient's care.

## 2021-08-23 NOTE — Assessment & Plan Note (Signed)
We will need to email [name] the patient's been admitted Patient's been followed by radiation oncology

## 2021-08-23 NOTE — Assessment & Plan Note (Addendum)
Chronic stable on baseline oxygen we will continue Continue prednisone 20 mg daily

## 2021-08-23 NOTE — Assessment & Plan Note (Signed)
Does not tolerate statins.

## 2021-08-23 NOTE — ED Provider Notes (Signed)
[facility] DEPT Provider Note  CSN: [id number] Arrival date & time: 08/23/21 1509  Chief Complaint(s) Back Pain  HPI John [name] is a 61 y.o. male with PMH metastatic lung cancer who presents emergency department for evaluation of back pain.  Patient seen in the emergency department on 08/20/2021 and was found to have a new T12 fracture.  Neurosurgery was consulted and patient was placed in a TLSO brace and at that time patient ultimately felt good enough to go home and trial a pain regimen at home with bracing.  Since discharge, he has been unable to get off the couch and his pain is out of control.  He thus returns to the emergency department for pain control.  Denies chest pain, shortness of breath, abdominal pain, nausea, vomiting or other systemic symptoms.   Past Medical History Past Medical History:  Diagnosis Date   Atrial fibrillation with RVR ([place]) 05/12/2020   Dyspnea    Family history of adverse reaction to anesthesia    brother with seizures had episode under anesthesia.  Patient has seizures as well.   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[place]) 07/29/2021   Obesity 07/27/2021   Normocytic anemia 04/18/2021   Pneumothorax on right 04/14/2021   Chronic respiratory failure with hypoxia ([place]) 04/14/2021   Drug-induced pneumonitis 12/23/2020   Leukocytopenia 12/22/2020   Shortness of breath  12/18/2020   Abnormal chest x-ray 12/18/2020   Secondary hypercoagulable state ([place]) 10/28/2020   Malignant neoplasm of lung (HCC)    Paroxysmal A-fib ([place]) 10/06/2020   RA (rheumatoid arthritis) ([place]) 10/06/2020   Persistent atrial fibrillation ([place]) 08/25/2020   Drug-induced neutropenia ([place]) 08/11/2020   Encounter for antineoplastic immunotherapy 06/09/2020   Hypokalemia    Orthostatic hypotension    C. difficile colitis    Bacteremia due to Pseudomonas    Diarrhea    Hyponatremia 05/12/2020   Neutropenic fever ([place]) 05/12/2020   Occlusion of right pulmonary artery ([place]) 05/12/2020   Neutropenia ([place]) 05/12/2020   Thrombus    Stage 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 ([place]) 04/10/2020   Encounter for antineoplastic chemotherapy 04/10/2020   Mass of right lung 03/24/2020   COPD with asthma ([place]) 03/24/2020   Pseudophakia of right eye 11/19/2014   Chronic anxiety 11/08/2014   H/O rheumatoid arthritis 11/08/2014   Petit mal seizure status ([place]) 11/08/2014   Age-related nuclear cataract of left eye 08/12/2014   Home Medication(s) Prior to Admission medications   Medication Sig Start Date End Date Taking? Authorizing Provider  acetaminophen (TYLENOL) 500 MG tablet Take 500-1,000 mg by mouth every 6 (six) hours as needed for mild pain.    [provider]  amiodarone (PACERONE) 200 MG tablet Take 1 tablet (200 mg total) by mouth daily. Patient taking differently: Take 200 mg by mouth See admin instructions. Ordered at discharge (08/07/21) - take one tablet (200 mg) twice daily, on 08/24/21 change  to one tablet once daily 08/24/21   Fenton, Clint R, PA  apixaban (ELIQUIS) 5 MG TABS tablet Take 1 tablet (5 mg total) by mouth 2 (two) times daily. 01/05/21   Fenton, Clint R, PA  benzonatate (TESSALON) 200 MG capsule Take 1 capsule (200 mg total) by mouth 3 (three) times daily as needed for cough. 07/23/21   Martyn Ehrich, NP  metoprolol succinate (TOPROL-XL) 50 MG 24 hr  tablet Take 1 tablet (50 mg total) by mouth daily. Take with or immediately following a meal. 08/08/21 09/07/21  Darliss Cheney, MD  omeprazole (PRILOSEC OTC) 20 MG tablet Take 20 mg by mouth at bedtime.    [provider]  oxyCODONE (ROXICODONE) 5 MG immediate release tablet Take 1 tablet (5 mg total) by mouth every 6 (six) hours as needed for severe pain. 08/20/21   Rancour, Annie Main, MD  oxyCODONE-acetaminophen (PERCOCET/ROXICET) 5-325 MG tablet Take 1 tablet by mouth every 4 (four) hours as needed for severe pain. 08/21/21   Eppie Gibson, MD  PARoxetine (PAXIL) 10 MG tablet Take 10 mg by mouth at bedtime. 01/25/20   [provider]  predniSONE (DELTASONE) 10 MG tablet Take 4 tablets (40 mg total) by mouth daily with breakfast for 21 days, THEN 3 tablets (30 mg total) daily with breakfast for 5 days, THEN 2 tablets (20 mg total) daily with breakfast for 5 days, THEN 1 tablet (10 mg total) daily with breakfast for 5 days. 07/26/21 08/31/21  Martyn Ehrich, NP  prochlorperazine (COMPAZINE) 10 MG tablet Take 1 tablet (10 mg total) by mouth every 6 (six) hours as needed (Nausea or vomiting). Patient not taking: Reported on 06/29/2021 01/29/21 07/20/21  Volanda Napoleon, MD                                                                                                                                    Past Surgical History Past Surgical History:  Procedure Laterality Date   BRONCHIAL BRUSHINGS  03/27/2020   Procedure: BRONCHIAL BRUSHINGS;  Surgeon: [name], MD;  Location: [place];  Service: Cardiopulmonary;;   BUBBLE STUDY  10/13/2020   Procedure: BUBBLE STUDY;  Surgeon: Pixie Casino, MD;  Location: [place];  Service: Cardiovascular;;   CARDIOVERSION N/A 10/13/2020   Procedure: CARDIOVERSION;  Surgeon: Pixie Casino, MD;  Location[facility] ENDOSCOPY;  Service: Cardiovascular;  Laterality: N/A;   CATARACT EXTRACTION  2016   at [place]  03/27/2020    Procedure: FINE NEEDLE ASPIRATION;  Surgeon: [name], MD;  Location: [place];  Service: Cardiopulmonary;;   HIP SURGERY Left    TEE WITHOUT CARDIOVERSION N/A 10/13/2020   Procedure: TRANSESOPHAGEAL ECHOCARDIOGRAM (TEE);  Surgeon: Pixie Casino, MD;  Location: [place];  Service: Cardiovascular;  Laterality: N/A;   VIDEO BRONCHOSCOPY WITH ENDOBRONCHIAL ULTRASOUND N/A 03/27/2020   Procedure: VIDEO BRONCHOSCOPY WITH ENDOBRONCHIAL ULTRASOUND;  Surgeon: [name],  Rose Fillers, MD;  Location: [facility] ENDOSCOPY;  Service: Cardiopulmonary;  Laterality: N/A;   Family History Family History  Problem Relation Age of Onset   Arrhythmia Mother        has PPM   Heart disease Father        Died at 43, started in his 56s, heart attacks, had PPM and ICD    Social History Social History   Tobacco Use   Smoking status: Former    Packs/day: 1.00    Years: 30.00    Total pack years: 30.00    Types: Cigarettes    Quit date: 2018    Years since quitting: 5.5   Smokeless tobacco: Never   Tobacco comments:    Former smoker 10/28/2020  Vaping Use   Vaping Use: Never used  Substance Use Topics   Alcohol use: Not Currently   Drug use: Never   Allergies Albuterol and Atorvastatin  Review of Systems Review of Systems  Musculoskeletal:  Positive for back pain.    Physical Exam Vital Signs  I have reviewed the triage vital signs BP (!) 103/57 (BP Location: Right Arm)   Pulse 78   Temp 98 F (36.7 C) (Oral)   Resp 16   Ht 5\' 11"  (1.803 m)   Wt 104.3 kg   SpO2 91%   BMI 32.08 kg/m   Physical Exam Constitutional:      General: He is not in acute distress.    Appearance: Normal appearance.  HENT:     Head: Normocephalic and atraumatic.     Nose: No congestion or rhinorrhea.  Eyes:     General:        Right eye: No discharge.        Left eye: No discharge.     Extraocular Movements: Extraocular movements intact.     Pupils: Pupils are equal, round, and reactive to light.   Cardiovascular:     Rate and Rhythm: Normal rate and regular rhythm.     Heart sounds: No murmur heard. Pulmonary:     Effort: No respiratory distress.     Breath sounds: No wheezing or rales.  Abdominal:     General: There is no distension.     Tenderness: There is no abdominal tenderness.  Musculoskeletal:        General: Tenderness present. Normal range of motion.     Cervical back: Normal range of motion.  Skin:    General: Skin is warm and dry.  Neurological:     General: No focal deficit present.     Mental Status: He is alert.     ED Results and Treatments Labs (all labs ordered are listed, but only abnormal results are displayed) Labs Reviewed - No data to display                                                                                                                        Radiology No results found.  Pertinent labs &  imaging results that were available during my care of the patient were reviewed by me and considered in my medical decision making (see MDM for details).  Medications Ordered in ED Medications - No data to display                                                                                                                                   Procedures Procedures  (including critical care time)  Medical Decision Making / ED Course   This patient presents to the ED for concern of back pain, this involves an extensive number of treatment options, and is a complaint that carries with it a high risk of complications and morbidity.  The differential diagnosis includes failure of outpatient pain control regimen, fracture, musculoskeletal strain  MDM: Patient seen emergency room for evaluation of back pain.  Physical exam with tenderness in the lower T-spine at the site of his fracture.  Laboratory evaluation with hemoglobin of 11.7 but is otherwise largely unremarkable.  Patient given lidocaine patches, Voltaren gel and morphine for pain control  which improved his pain slightly but given failure of outpatient regimen and need for PT in the setting of a new, likely pathologic fracture, patient will require hospital admission.  Patient then admitted.   Additional history obtained: -Additional history obtained from wife -External records from outside source obtained and reviewed including: Chart review including previous notes, labs, imaging, consultation notes   Lab Tests: -I ordered, reviewed, and interpreted labs.   The pertinent results include:   Labs Reviewed - No data to display    EKG   EKG Interpretation  Date/Time:  Sunday August 23 2021 15:23:10 EDT Ventricular Rate:  77 PR Interval:  155 QRS Duration: 99 QT Interval:  410 QTC Calculation: 464 R Axis:   -63 Text Interpretation: Sinus rhythm Consider right ventricular hypertrophy Confirmed by [place] ([id number]) on 08/23/2021 11:04:17 PM         Medicines ordered and prescription drug management: No orders of the defined types were placed in this encounter.   -I have reviewed the patients home medicines and have made adjustments as needed  Critical interventions none  Cardiac Monitoring: The patient was maintained on a cardiac monitor.  I personally viewed and interpreted the cardiac monitored which showed an underlying rhythm of: NSR  Social Determinants of Health:  Factors impacting patients care include: none   Reevaluation: After the interventions noted above, I reevaluated the patient and found that they have :improved  Co morbidities that complicate the patient evaluation  Past Medical History:  Diagnosis Date   Atrial fibrillation with RVR ([place]) 05/12/2020   Dyspnea    Family history of adverse reaction to anesthesia    brother with seizures had episode under anesthesia.  Patient has seizures as well.   GERD (gastroesophageal reflux disease)    History of radiation therapy 04/24/2020-05/16/2020   IMRT to right lung  Dr [name]   PAF  (paroxysmal atrial fibrillation) ([place])    CHADS2VSAC score 0   Rheumatoid aortitis    Sepsis ([place]) 10/06/2020   Squamous cell lung cancer ([place])       Dispostion: I considered admission for this patient, and given failure of outpatient pain regimen and need for PT in the setting of a known vertebral fracture, patient will require admission     Final Clinical Impression(s) / ED Diagnoses Final diagnoses:  None     @PCDICTATION @    Teressa Lower, MD 08/23/21 [phone]

## 2021-08-23 NOTE — Assessment & Plan Note (Signed)
Chronic continue home oxygen titrate as needed

## 2021-08-24 ENCOUNTER — Encounter (HOSPITAL_COMMUNITY): Payer: Self-pay | Admitting: Internal Medicine

## 2021-08-24 ENCOUNTER — Telehealth: Payer: Self-pay

## 2021-08-24 DIAGNOSIS — C7970 Secondary malignant neoplasm of unspecified adrenal gland: Secondary | ICD-10-CM | POA: Diagnosis present

## 2021-08-24 DIAGNOSIS — C779 Secondary and unspecified malignant neoplasm of lymph node, unspecified: Secondary | ICD-10-CM | POA: Diagnosis not present

## 2021-08-24 DIAGNOSIS — Z87891 Personal history of nicotine dependence: Secondary | ICD-10-CM

## 2021-08-24 DIAGNOSIS — K219 Gastro-esophageal reflux disease without esophagitis: Secondary | ICD-10-CM | POA: Diagnosis present

## 2021-08-24 DIAGNOSIS — M549 Dorsalgia, unspecified: Secondary | ICD-10-CM | POA: Diagnosis present

## 2021-08-24 DIAGNOSIS — E782 Mixed hyperlipidemia: Secondary | ICD-10-CM | POA: Diagnosis present

## 2021-08-24 DIAGNOSIS — C3411 Malignant neoplasm of upper lobe, right bronchus or lung: Secondary | ICD-10-CM

## 2021-08-24 DIAGNOSIS — J9611 Chronic respiratory failure with hypoxia: Secondary | ICD-10-CM

## 2021-08-24 DIAGNOSIS — Z7901 Long term (current) use of anticoagulants: Secondary | ICD-10-CM | POA: Diagnosis not present

## 2021-08-24 DIAGNOSIS — Z8249 Family history of ischemic heart disease and other diseases of the circulatory system: Secondary | ICD-10-CM | POA: Diagnosis not present

## 2021-08-24 DIAGNOSIS — J449 Chronic obstructive pulmonary disease, unspecified: Secondary | ICD-10-CM | POA: Diagnosis not present

## 2021-08-24 DIAGNOSIS — C3490 Malignant neoplasm of unspecified part of unspecified bronchus or lung: Secondary | ICD-10-CM | POA: Diagnosis not present

## 2021-08-24 DIAGNOSIS — C7971 Secondary malignant neoplasm of right adrenal gland: Secondary | ICD-10-CM

## 2021-08-24 DIAGNOSIS — I480 Paroxysmal atrial fibrillation: Secondary | ICD-10-CM

## 2021-08-24 DIAGNOSIS — D630 Anemia in neoplastic disease: Secondary | ICD-10-CM | POA: Diagnosis present

## 2021-08-24 DIAGNOSIS — J189 Pneumonia, unspecified organism: Secondary | ICD-10-CM | POA: Diagnosis not present

## 2021-08-24 DIAGNOSIS — Z79899 Other long term (current) drug therapy: Secondary | ICD-10-CM | POA: Diagnosis not present

## 2021-08-24 DIAGNOSIS — Z6832 Body mass index (BMI) 32.0-32.9, adult: Secondary | ICD-10-CM | POA: Diagnosis not present

## 2021-08-24 DIAGNOSIS — M4854XA Collapsed vertebra, not elsewhere classified, thoracic region, initial encounter for fracture: Secondary | ICD-10-CM | POA: Diagnosis present

## 2021-08-24 DIAGNOSIS — I4891 Unspecified atrial fibrillation: Secondary | ICD-10-CM | POA: Diagnosis not present

## 2021-08-24 DIAGNOSIS — E669 Obesity, unspecified: Secondary | ICD-10-CM | POA: Diagnosis present

## 2021-08-24 DIAGNOSIS — I2510 Atherosclerotic heart disease of native coronary artery without angina pectoris: Secondary | ICD-10-CM | POA: Diagnosis present

## 2021-08-24 DIAGNOSIS — T380X5A Adverse effect of glucocorticoids and synthetic analogues, initial encounter: Secondary | ICD-10-CM | POA: Diagnosis present

## 2021-08-24 DIAGNOSIS — M069 Rheumatoid arthritis, unspecified: Secondary | ICD-10-CM | POA: Diagnosis present

## 2021-08-24 DIAGNOSIS — Z888 Allergy status to other drugs, medicaments and biological substances status: Secondary | ICD-10-CM | POA: Diagnosis not present

## 2021-08-24 DIAGNOSIS — E559 Vitamin D deficiency, unspecified: Secondary | ICD-10-CM | POA: Diagnosis present

## 2021-08-24 DIAGNOSIS — E785 Hyperlipidemia, unspecified: Secondary | ICD-10-CM

## 2021-08-24 DIAGNOSIS — S22080D Wedge compression fracture of T11-T12 vertebra, subsequent encounter for fracture with routine healing: Secondary | ICD-10-CM | POA: Diagnosis not present

## 2021-08-24 DIAGNOSIS — I4819 Other persistent atrial fibrillation: Secondary | ICD-10-CM | POA: Diagnosis present

## 2021-08-24 DIAGNOSIS — Z85118 Personal history of other malignant neoplasm of bronchus and lung: Secondary | ICD-10-CM | POA: Diagnosis not present

## 2021-08-24 DIAGNOSIS — Z9221 Personal history of antineoplastic chemotherapy: Secondary | ICD-10-CM | POA: Diagnosis not present

## 2021-08-24 LAB — CBC WITH DIFFERENTIAL/PLATELET
Abs Immature Granulocytes: 0.12 10*3/uL — ABNORMAL HIGH (ref 0.00–0.07)
Basophils Absolute: 0 10*3/uL (ref 0.0–0.1)
Basophils Relative: 0 %
Eosinophils Absolute: 0.1 10*3/uL (ref 0.0–0.5)
Eosinophils Relative: 2 %
HCT: 34 % — ABNORMAL LOW (ref 39.0–52.0)
Hemoglobin: 11.1 g/dL — ABNORMAL LOW (ref 13.0–17.0)
Immature Granulocytes: 2 %
Lymphocytes Relative: 6 %
Lymphs Abs: 0.4 10*3/uL — ABNORMAL LOW (ref 0.7–4.0)
MCH: 28.6 pg (ref 26.0–34.0)
MCHC: 32.6 g/dL (ref 30.0–36.0)
MCV: 87.6 fL (ref 80.0–100.0)
Monocytes Absolute: 0.5 10*3/uL (ref 0.1–1.0)
Monocytes Relative: 7 %
Neutro Abs: 5.6 10*3/uL (ref 1.7–7.7)
Neutrophils Relative %: 83 %
Platelets: 183 10*3/uL (ref 150–400)
RBC: 3.88 MIL/uL — ABNORMAL LOW (ref 4.22–5.81)
RDW: 17.7 % — ABNORMAL HIGH (ref 11.5–15.5)
WBC: 6.7 10*3/uL (ref 4.0–10.5)
nRBC: 0 % (ref 0.0–0.2)

## 2021-08-24 LAB — COMPREHENSIVE METABOLIC PANEL
ALT: 18 U/L (ref 0–44)
AST: 15 U/L (ref 15–41)
Albumin: 3.1 g/dL — ABNORMAL LOW (ref 3.5–5.0)
Alkaline Phosphatase: 70 U/L (ref 38–126)
Anion gap: 8 (ref 5–15)
BUN: 15 mg/dL (ref 6–20)
CO2: 31 mmol/L (ref 22–32)
Calcium: 9 mg/dL (ref 8.9–10.3)
Chloride: 99 mmol/L (ref 98–111)
Creatinine, Ser: 0.95 mg/dL (ref 0.61–1.24)
GFR, Estimated: >60 mL/min (ref >60)
Glucose, Bld: 107 mg/dL — ABNORMAL HIGH (ref 70–99)
Potassium: 4 mmol/L (ref 3.5–5.1)
Sodium: 138 mmol/L (ref 135–145)
Total Bilirubin: 0.8 mg/dL (ref 0.3–1.2)
Total Protein: 6.2 g/dL — ABNORMAL LOW (ref 6.5–8.1)

## 2021-08-24 LAB — PREALBUMIN: Prealbumin: 16.9 mg/dL — ABNORMAL LOW (ref 18–38)

## 2021-08-24 LAB — VITAMIN D 25 HYDROXY (VIT D DEFICIENCY, FRACTURES): Vit D, 25-Hydroxy: 10.32 ng/mL — ABNORMAL LOW (ref 30–100)

## 2021-08-24 MED ORDER — HYDROMORPHONE HCL 1 MG/ML IJ SOLN
0.5000 mg | INTRAMUSCULAR | Status: DC | PRN
  Administered 2021-08-24 – 2021-08-25 (×2): 0.5 mg via INTRAVENOUS
  Filled 2021-08-24 (×3): qty 0.5

## 2021-08-24 MED ORDER — METOPROLOL SUCCINATE ER 25 MG PO TB24
25.0000 mg | ORAL_TABLET | Freq: Every day | ORAL | Status: DC
  Administered 2021-08-24 – 2021-08-26 (×3): 25 mg via ORAL
  Filled 2021-08-24 (×3): qty 1

## 2021-08-24 MED ORDER — VITAMIN D (ERGOCALCIFEROL) 1.25 MG (50000 UNIT) PO CAPS
50000.0000 [IU] | ORAL_CAPSULE | ORAL | Status: DC
Start: 2021-08-24 — End: 2021-08-29
  Administered 2021-08-24: 50000 [IU] via ORAL
  Filled 2021-08-24 (×2): qty 1

## 2021-08-24 MED ORDER — BOOST / RESOURCE BREEZE PO LIQD CUSTOM
1.0000 | ORAL | Status: DC
  Administered 2021-08-25 – 2021-08-27 (×2): 1 via ORAL

## 2021-08-24 MED ORDER — ADULT MULTIVITAMIN W/MINERALS CH
1.0000 | ORAL_TABLET | Freq: Every day | ORAL | Status: DC
  Administered 2021-08-24 – 2021-08-29 (×6): 1 via ORAL
  Filled 2021-08-24 (×6): qty 1

## 2021-08-24 MED ORDER — ENSURE ENLIVE PO LIQD
237.0000 mL | ORAL | Status: DC
  Administered 2021-08-24 – 2021-08-28 (×4): 237 mL via ORAL

## 2021-08-24 MED ORDER — OXYCODONE HCL 5 MG PO TABS
5.0000 mg | ORAL_TABLET | ORAL | Status: DC | PRN
  Administered 2021-08-24: 10 mg via ORAL
  Administered 2021-08-24: 5 mg via ORAL
  Administered 2021-08-24 – 2021-08-25 (×2): 10 mg via ORAL
  Administered 2021-08-25: 5 mg via ORAL
  Administered 2021-08-25 – 2021-08-26 (×6): 10 mg via ORAL
  Filled 2021-08-24 (×11): qty 2

## 2021-08-24 NOTE — Plan of Care (Signed)
  Problem: Education: Goal: Knowledge of General Education information will improve Description: Including pain rating scale, medication(s)/side effects and non-pharmacologic comfort measures Outcome: Progressing   Problem: Clinical Measurements: Goal: Ability to maintain clinical measurements within normal limits will improve Outcome: Progressing Goal: Respiratory complications will improve Outcome: Progressing   Problem: Safety: Goal: Ability to remain free from injury will improve Outcome: Progressing   

## 2021-08-24 NOTE — Progress Notes (Addendum)
PT Cancellation Note  Patient Details Name: John [name] MRN: [id number] DOB: 04/13/1960   Cancelled Treatment:    Reason Eval/Treat Not Completed: Pain limiting ability to participate. Attempted PT eval-pt stated he wanted to see if he could have pain meds before trying-encouraged him to check with his RN to see if he is able to have something else. FYI-had secure chat with RN this am to get him pre-medicated before PT came by. She reported she gave him something by IV. Will likely have to check back another day.     [facility]  Office: [phone] Pager: [phone]

## 2021-08-24 NOTE — Telephone Encounter (Signed)
Received call from patients wife requesting a call from Dr. [name]. Wife unsure if patient will be able to complete radiation treatments due to patient being in severe pain.  Wife waiting to speak to neurosurgeon for pain control options.

## 2021-08-24 NOTE — Progress Notes (Signed)
Initial Nutrition Assessment  DOCUMENTATION CODES:   Not applicable  INTERVENTION:  - will order Boost Breeze once/day, each supplement provides 250 kcal and 9 grams of protein.  - will order Ensure Plus High Protein once/day, each supplement provides 350 kcal and 20 grams of protein.  - will order 1 tablet multivitamin with minerals.  - continue to encourage intake of fluid.  - complete NFPE when feasible.    NUTRITION DIAGNOSIS:   Increased nutrient needs related to cancer and cancer related treatments as evidenced by estimated needs.  GOAL:   Patient will meet greater than or equal to 90% of their needs  MONITOR:   PO intake, Supplement acceptance, Labs, Weight trends  REASON FOR ASSESSMENT:   Consult Assessment of nutrition requirement/status  ASSESSMENT:   61 year old male with medical history of metastatic lung cancer, afib, RA, GERD, and hx of C. difficile. He presented to the ED due to severe back pain. Several weeks ago he hit himself against the washer, and following that he experienced back pain. He went to the ED on 7/6, was found to have a T12 compression fracture.  EDP discussed with neurosurgery who recommended TLSO brace, home discharge and outpatient follow-up. Patient went home, tried oxycodone however has been having difficulties managing his ADLs, being on the couch most of the time and unable to get relief and decided to come back to the ER.  Patient laying on his side in bed with no visitors present at the time of RD visit. He was seen by a [place] RD at the [facility] on 08/26/20 but has not been seen by a [place] RD since that time.  Patient reports that he has had a good appetite until recently when back pain became severe. He has not been feeling hungry or thirsty and through discussion, he indicates that he has drank 1/2 of the large, refillable plastic cup that is provided to patients.   Patient shares that he has a BM daily but that  with pain meds, it has been 4 days since his last BM. He has not taken laxatives or stool softeners at home in the past d/t regular BMs. Discussed the importance of drinking liquids. Offered to get patient something to eat and/or drink or order him something but he would like to hold off for now.   Weight yesterday was 223 lb and weight on 6/7 was 226 lb. This indicates 3 lb weight loss (1.3% body weight) in the past 1 month.    Labs reviewed. Medications reviewed; 20 mg deltasone/day, 50000 units drisdol every 7 days starting 7/10.    NUTRITION - FOCUSED PHYSICAL EXAM:  Patient in extreme pain.  Diet Order:   Diet Order             Diet Heart Room service appropriate? Yes; Fluid consistency: Thin  Diet effective now                   EDUCATION NEEDS:   Education needs have been addressed  Skin:  Skin Assessment: Reviewed RN Assessment  Last BM:  PTA (patient reported 4 days ago)  Height:   Ht Readings from Last 1 Encounters:  08/23/21 5\' 11"  (1.803 m)    Weight:   Wt Readings from Last 1 Encounters:  08/23/21 101 kg     BMI:  Body mass index is 31.06 kg/m.  Estimated Nutritional Needs:  Kcal:  2200-2500 kcal Protein:  110-125 grams Fluid:  >/= 2.5 L/day  John Matin, MS, RD, LDN, [place] Registered Dietitian II Inpatient Clinical Nutrition RD pager # and on-call/weekend pager # available in [facility]

## 2021-08-24 NOTE — Progress Notes (Addendum)
PROGRESS NOTE  John [name] FWY:[id number] DOB: 1960/08/20 DOA: 08/23/2021 PCP: [name], MD   LOS: 0 days   Brief Narrative / Interim history: 61 year old male with history of metastatic lung cancer, PAF, RA, history of C. difficile comes into the hospital with back pain.  About couple weeks ago he hit himself against the washer, and following that he experienced back pain.  He went to the ED on July 6, was found to have a T12 compression fracture.  EDP discussed with neurosurgery who recommended TLSO brace, home discharge and outpatient follow-up.  Patient went home, tried oxycodone however has been having difficulties managing his ADLs, being on the couch most of the time and unable to get relief and decided to come back to the ER.  Subjective / 24h Interval events: Laying flat in bed, he is afraid to move.  Reports back pain.  Assesement and Plan: Principal Problem:   T12 compression fracture (HCC) Active Problems:   Paroxysmal atrial fibrillation with RVR (HCC)   Drug-induced pneumonitis   COPD with asthma (HCC)   Normocytic anemia   Coronary artery disease involving native coronary artery of native heart without angina pectoris   Stage 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 (HCC)   Mixed hyperlipidemia   Principal problem T12 compression fracture, intractable back pain-still requiring IV Dilaudid this morning due to severe pain.  It appears that he has failed outpatient management.  He did not make it to his outpatient neurosurgery appointment, based on EDP notes there was a consideration of kyphoplasty.  IR consulted today to evaluate that.  Hold Eliquis until evaluated by IR, continue pain control  Active problems PAF-continue amiodarone, start metoprolol at a lower dose given normotensive on admission.  Eliquis on hold as above until IR evaluates.  Continue to monitor on telemetry.  Chronic hypoxic respiratory failure on 3 L at home, COPD, asthma-this appears  stable.  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  CAD-continue beta-blockers, no chest pain, this is stable  Hyperlipidemia-does not tolerate statins  Obesity, class I-BMI 31  Vitamin D deficiency-replete  Normocytic anemia-due to underlying malignancy  Scheduled Meds:  amiodarone  200 mg Oral Daily   diclofenac Sodium  4 g Topical QID   lidocaine  1 patch Transdermal Q24H   metoprolol succinate  25 mg Oral Daily   PARoxetine  10 mg Oral QHS   predniSONE  20 mg Oral Q breakfast   Continuous Infusions:  methocarbamol (ROBAXIN) IV     PRN Meds:.HYDROmorphone (DILAUDID) injection, methocarbamol **OR** methocarbamol (ROBAXIN) IV, oxyCODONE  Diet Orders (From admission, onward)     Start     Ordered   08/23/21 2023  Diet Heart Room service appropriate? Yes; Fluid consistency: Thin  Diet effective now       Question Answer Comment  Room service appropriate? Yes   Fluid consistency: Thin      08/23/21 2022            DVT prophylaxis:  Eliquis on hold   Lab Results  Component Value Date   PLT 183 08/24/2021      Code Status: Full Code  Family Communication: no family at bedside   Status is: Observation The patient will require care spanning > 2 midnights and should be moved to inpatient because: Persistent pain, failed outpatient management, IR to evaluate  Level of care: Telemetry  Consultants:  IR Oncology   Objective: Vitals:  08/23/21 2028 08/24/21 0031 08/24/21 0553 08/24/21 0752  BP:  101/74 111/78 108/76  Pulse:  75 84 94  Resp:  20 18 18   Temp:  98.3 F (36.8 C) 98.2 F (36.8 C) 98.2 F (36.8 C)  TempSrc:  Oral Oral Oral  SpO2:  94% 95% 93%  Weight: 101 kg     Height: 5\' 11"  (1.803 m)       Intake/Output Summary (Last 24 hours) at 08/24/2021 1111 Last data filed at 08/24/2021 1194 Gross per 24 hour  Intake 1360 ml  Output 475 ml  Net 885 ml    Wt Readings from Last 3 Encounters:  08/23/21 101 kg  08/20/21 104.3 kg  08/13/21 103.4 kg    Examination:  Constitutional: NAD Eyes: no scleral icterus ENMT: Mucous membranes are moist.  Neck: normal, supple Respiratory: clear to auscultation bilaterally, no wheezing, no crackles. Cardiovascular: Regular rate and rhythm, no murmurs / rubs / gallops. No LE edema.  Abdomen: non distended, no tenderness. Bowel sounds positive.  Musculoskeletal: no clubbing / cyanosis.  Skin: no rashes Neurologic: non focal    Data Reviewed: I have independently reviewed following labs and imaging studies  CBC Recent Labs  Lab 08/20/21 1139 08/23/21 1547 08/24/21 0422  WBC 8.4 7.2 6.7  HGB 12.0* 11.7* 11.1*  HCT 37.3* 35.3* 34.0*  PLT 234 191 183  MCV 87.6 86.5 87.6  MCH 28.2 28.7 28.6  MCHC 32.2 33.1 32.6  RDW 18.2* 17.4* 17.7*  LYMPHSABS 0.2* 0.2* 0.4*  MONOABS 0.5 0.4 0.5  EOSABS 0.0 0.0 0.1  BASOSABS 0.0 0.0 0.0    Recent Labs  Lab 08/20/21 1139 08/23/21 1547 08/23/21 1920 08/24/21 0422  NA 139 135  --  138  K 4.1 4.4  --  4.0  CL 103 98  --  99  CO2 28 29  --  31  GLUCOSE 146* 164*  --  107*  BUN 13 14  --  15  CREATININE 0.87 0.89  --  0.95  CALCIUM 9.0 8.5*  --  9.0  AST  --  13*  --  15  ALT  --  19  --  18  ALKPHOS  --  69  --  70  BILITOT  --  0.8  --  0.8  ALBUMIN  --  3.0*  --  3.1*  MG  --   --  2.0  --   INR 1.2  --   --   --   BNP 34.6  --   --   --     ------------------------------------------------------------------------------------------------------------------ No results for input(s): "CHOL", "HDL", "LDLCALC", "TRIG", "CHOLHDL", "LDLDIRECT" in the last 72 hours.  No results found for: "HGBA1C" ------------------------------------------------------------------------------------------------------------------ No results for input(s): "TSH", "T4TOTAL", "T3FREE", "THYROIDAB" in the last 72 hours.  Invalid input(s): "FREET3"  Cardiac  Enzymes No results for input(s): "CKMB", "TROPONINI", "MYOGLOBIN" in the last 168 hours.  Invalid input(s): "CK" ------------------------------------------------------------------------------------------------------------------    Component Value Date/Time   BNP 34.6 08/20/2021 1139    CBG: No results for input(s): "GLUCAP" in the last 168 hours.  No results found for this or any previous visit (from the past 240 hour(s)).   Radiology Studies: No results found.   Marzetta Board, MD, PhD [facility]  Between 7 am - 7 pm I am available, please contact me via Amion (for emergencies) or Securechat (non urgent messages)  Between 7 pm - 7 am I am not available, please contact night coverage MD/APP via Amion

## 2021-08-24 NOTE — Progress Notes (Signed)
Chaplain engaged in an initial visit with John [name], his son, and close friend.  Chaplain responded to consult for Healthcare POA, Advanced Directive.  John [name] is interested in assigning his wife as his healthcare agent.  Chaplain voiced that because he is married, Mrs. Trimpe, automatically has decision making power.  John [name] also shared that he and his wife have had pertinent conversations around his healthcare needs and desires.  Chaplain let them know they could still go over the paperwork and complete it but that it isn't as necessary with him wanting to assign his wife. John [name] was ok with not going forward with the completion.   Chaplain offered education, support, presence, and a space for questions and sharing.  John [name] voiced that he had a hard night of being in pain and that the medical team was working with getting his pain under control.  He is currently very satisfied with the team and noted how he has been saved by [employer] many times.      08/24/21 1100  Clinical Encounter Type  Visited With Patient and family together  Visit Type Initial;Social support  Spiritual Encounters  Spiritual Needs Literature;Brochure

## 2021-08-24 NOTE — Consult Note (Addendum)
[place]  [phone] [phone]   MEDICAL ONCOLOGY - CONSULTATION  Referral MD: Dr. 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 difficile.  He presented to the emergency department with back pain.  He was recently seen in the emergency department on 7/6 and was found to have a T12 compression fracture and had a TLSO brace placed and was able to be discharged home.  However, he could not tolerate the pain.  He has been receiving palliative radiation to his right adrenal mass.  Plan is to begin gemcitabine/Navelbine following completion of radiation.  This morning, he reports that his pain is not currently well controlled.  He currently has an order for oxycodone 5 mg every 6 hours as needed for pain and Dilaudid 0.5 mg every 4 hours as needed for severe pain has also been ordered as of this morning.  He has not yet received a dose of this.  He was supposed to go to radiation this morning but declined due to significant back pain.  He is not having any fevers or chills.  Denies chest pain.  He has his baseline shortness of breath.  Denies abdominal pain, nausea, vomiting.  No bleeding reported.  Bruises easily.  Medical oncology was asked see the patient make recommendations regarding his metastatic lung cancer.  Past Medical History:  Diagnosis Date   Atrial fibrillation with RVR ([place]) 05/12/2020   Dyspnea    Family history of adverse reaction to anesthesia    brother with seizures had episode under anesthesia.  Patient has seizures as well.   GERD (gastroesophageal reflux disease)    History of radiation therapy 04/24/2020-05/16/2020   IMRT to right lung     Dr [name]   PAF (paroxysmal atrial fibrillation) ([place])    CHADS2VSAC score 0    Rheumatoid aortitis    Sepsis ([place]) 10/06/2020   Squamous cell lung cancer ([place])   :   Past Surgical History:  Procedure Laterality Date   BRONCHIAL BRUSHINGS  03/27/2020   Procedure: BRONCHIAL BRUSHINGS;  Surgeon: [name], MD;  Location: [place];  Service: Cardiopulmonary;;   BUBBLE STUDY  10/13/2020   Procedure: BUBBLE STUDY;  Surgeon: Pixie Casino, MD;  Location: [place];  Service: Cardiovascular;;   CARDIOVERSION N/A 10/13/2020   Procedure: CARDIOVERSION;  Surgeon: Pixie Casino, MD;  Location: [facility] ENDOSCOPY;  Service: Cardiovascular;  Laterality: N/A;   CATARACT EXTRACTION  2016   at [place]  03/27/2020   Procedure: FINE NEEDLE ASPIRATION;  Surgeon: [name], MD;  Location: [place];  Service: Cardiopulmonary;;   HIP SURGERY Left    TEE WITHOUT CARDIOVERSION N/A 10/13/2020   Procedure: TRANSESOPHAGEAL ECHOCARDIOGRAM (TEE);  Surgeon: Pixie Casino, MD;  Location: [place];  Service: Cardiovascular;  Laterality: N/A;   VIDEO BRONCHOSCOPY WITH ENDOBRONCHIAL ULTRASOUND N/A 03/27/2020   Procedure: VIDEO BRONCHOSCOPY WITH ENDOBRONCHIAL ULTRASOUND;  Surgeon: [name], MD;  Location: [place];  Service: Cardiopulmonary;  Laterality: N/A;  :   Current Facility-Administered Medications  Medication Dose Route Frequency Provider Last Rate Last Admin   amiodarone (PACERONE) tablet 200 mg  200 mg Oral Daily Doutova, Anastassia, MD   200 mg at 08/24/21 0914   diclofenac Sodium (VOLTAREN) 1 %  topical gel 4 g  4 g Topical QID Toy Baker, MD   4 g at 08/24/21 0915   HYDROmorphone (DILAUDID) injection 0.5 mg  0.5 mg Intravenous Q4H PRN Opyd, Ilene Qua, MD   0.5 mg at 08/24/21 0914   lidocaine (LIDODERM) 5 % 1 patch  1 patch Transdermal Q24H Toy Baker, MD   1 patch at 08/23/21 1555   methocarbamol (ROBAXIN) tablet 500 mg  500 mg Oral Q6H PRN Toy Baker, MD   500 mg at 08/24/21 0316   Or    methocarbamol (ROBAXIN) 500 mg in dextrose 5 % 50 mL IVPB  500 mg Intravenous Q6H PRN Doutova, Anastassia, MD       metoprolol succinate (TOPROL-XL) 24 hr tablet 25 mg  25 mg Oral Daily Gherghe, Costin M, MD       oxyCODONE (Oxy IR/ROXICODONE) immediate release tablet 5-10 mg  5-10 mg Oral Q4H PRN Maryanna Shape, NP       PARoxetine (PAXIL) tablet 10 mg  10 mg Oral QHS Doutova, Anastassia, MD   10 mg at 08/23/21 2116   predniSONE (DELTASONE) tablet 20 mg  20 mg Oral Q breakfast Toy Baker, MD   20 mg at 08/24/21 0914   Vitamin D (Ergocalciferol) (DRISDOL) capsule 50,000 Units  50,000 Units Oral Q7 days Caren Griffins, MD          Allergies  Allergen Reactions   Albuterol Other (See Comments)    Patient has had episode of atrial fib following administration of albuterol (tolerates Xopenex well)   Atorvastatin Other (See Comments)    Causes arthritis pain  :   Family History  Problem Relation Age of Onset   Arrhythmia Mother        has PPM   Heart disease Father        Died at 39, started in his 74s, heart attacks, had PPM and ICD  :   Social History   Socioeconomic History   Marital status: Married    Spouse name: Not on file   Number of children: Not on file   Years of education: Not on file   Highest education level: Not on file  Occupational History   Not on file  Tobacco Use   Smoking status: Former    Packs/day: 1.00    Years: 30.00    Total pack years: 30.00    Types: Cigarettes    Quit date: 2018    Years since quitting: 5.5   Smokeless tobacco: Never   Tobacco comments:    Former smoker 10/28/2020  Vaping Use   Vaping Use: Never used  Substance and Sexual Activity   Alcohol use: Not Currently   Drug use: Never   Sexual activity: Not on file  Other Topics Concern   Not on file  Social History Narrative   ** Merged History Encounter **       Social Determinants of Health   Financial Resource Strain: Not on file  Food Insecurity: Not  on file  Transportation Needs: Not on file  Physical Activity: Not on file  Stress: Not on file  Social Connections: Not on file  Intimate Partner Violence: Not on file  :  Review of Systems: A comprehensive 14 point review of systems was negative except as noted in the HPI.  Exam: Patient Vitals for the past 24 hrs:  BP Temp Temp src Pulse Resp SpO2 Height Weight  08/24/21 0752 108/76 98.2 F (36.8 C) Oral 94 18 93 % -- --  08/24/21 0553 111/78 98.2 F (36.8 C) Oral 84 18 95 % -- --  08/24/21 0031 101/74 98.3 F (36.8 C) Oral 75 20 94 % -- --  08/23/21 2028 -- -- -- -- -- -- 5\' 11"  (1.803 m) 101 kg  08/23/21 2023 109/60 97.9 F (36.6 C) Oral 79 18 91 % -- --  08/23/21 1800 108/64 -- -- 73 16 93 % -- --  08/23/21 1730 100/62 -- -- 73 -- 91 % -- --  08/23/21 1700 107/65 -- -- 71 16 95 % -- --  08/23/21 1630 106/63 -- -- 68 -- 94 % -- --  08/23/21 1600 (!) 105/59 -- -- 76 16 93 % -- --  08/23/21 1522 -- 98 F (36.7 C) Oral 78 -- 91 % -- --  08/23/21 1520 (!) 103/57 -- -- 77 16 91 % 5\' 11"  (1.803 m) 104.3 kg  08/23/21 1517 -- -- -- -- -- 95 % -- --    General: Laying in bed, appears to have pain that is not well controlled. Eyes:  no scleral icterus.   ENT:  There were no oropharyngeal lesions.   Lymphatics:  Negative cervical, supraclavicular or axillary adenopathy.   Respiratory: lungs were clear bilaterally without wheezing or crackles.   Cardiovascular:  Regular rate and rhythm, S1/S2, without murmur, rub or gallop.  There was no pedal edema.   GI:  abdomen was soft, flat, nontender, nondistended, without organomegaly.   Skin: Scattered ecchymoses. Neuro exam was nonfocal. Patient was alert and oriented.    Lab Results  Component Value Date   WBC 6.7 08/24/2021   HGB 11.1 (L) 08/24/2021   HCT 34.0 (L) 08/24/2021   PLT 183 08/24/2021   GLUCOSE 107 (H) 08/24/2021   CHOL 237 (H) 06/29/2021   TRIG 121 06/29/2021   HDL 31 (L) 06/29/2021   LDLCALC 182 (H) 06/29/2021    ALT 18 08/24/2021   AST 15 08/24/2021   NA 138 08/24/2021   K 4.0 08/24/2021   CL 99 08/24/2021   CREATININE 0.95 08/24/2021   BUN 15 08/24/2021   CO2 31 08/24/2021    MR Lumbar Spine W Wo Contrast  Result Date: 08/20/2021 CLINICAL DATA:  Back pain, compression fractures on CT EXAM: MRI LUMBAR SPINE WITHOUT AND WITH CONTRAST TECHNIQUE: Multiplanar and multiecho pulse sequences of the lumbar spine were obtained without and with intravenous contrast. CONTRAST:  44mL GADAVIST GADOBUTROL 1 MMOL/ML IV SOLN COMPARISON:  Correlation made with CT abdomen pelvis 07/14/2021 FINDINGS: Segmentation:  Standard. Alignment: No significant listhesis. No significant endplate retropulsion associated with compression fractures. Levocurvature. Vertebrae: Chronic compression fractures at L2, L4, and L5. New T12 compression fracture with approximately 50% loss of vertebral body height. Fracture lines are visible with STIR hyperintensity. There is mild marrow edema underlying the endplate. New L3 compression fracture with nearly 50% loss of height centrally. There is underlying mild marrow edema. There is enhancement at T12 and L3. Conus medullaris and cauda equina: Conus extends to the T12 level. Conus and cauda equina appear normal. Paraspinal and other soft tissues: Unremarkable. Disc levels: L1-L2: Disc bulge. Minor facet arthropathy. No significant canal or foraminal stenosis. L2-L3: Disc bulge. Minor facet arthropathy. Mild canal stenosis. Minor foraminal stenosis. L3-L4: Disc bulge. Minor facet arthropathy. Moderate canal stenosis. Slight effacement of subarticular recesses. Minor left foraminal stenosis. L4-L5: Disc bulge. Mild facet arthropathy with ligamentum flavum infolding. Moderate marked canal stenosis. Slight effacement of subarticular recesses. No significant foraminal stenosis. L5-S1: Disc bulge. Mild right and  moderate left facet arthropathy. No significant canal or foraminal stenosis. IMPRESSION: Acute to  subacute T12 and L3 compression fractures with approximately 50% loss of vertebral body height. No definite evidence of underlying lesion. Given that there are other chronic compression fractures present, these are favored to be benign. Electronically Signed   By: Macy Mis M.D.   On: 08/20/2021 15:46   CT ABDOMEN PELVIS W CONTRAST  Result Date: 08/20/2021 CLINICAL DATA:  Metastatic disease, worsening right lower back pain. Rule out retroperitoneal bleed . EXAM: CT ABDOMEN AND PELVIS WITH CONTRAST TECHNIQUE: Multidetector CT imaging of the abdomen and pelvis was performed using the standard protocol following bolus administration of intravenous contrast. RADIATION DOSE REDUCTION: This exam was performed according to the departmental dose-optimization program which includes automated exposure control, adjustment of the mA and/or kV according to patient size and/or use of iterative reconstruction technique. CONTRAST:  157mL OMNIPAQUE IOHEXOL 350 MG/ML SOLN COMPARISON:  None Available. FINDINGS: Lower chest: Chronic fibrosis/atelectasis of the right lung, not significantly changed. Hepatobiliary: No focal liver abnormality is seen. Rim calcified gallstone without gallbladder wall thickening, or biliary dilatation. Pancreas: Moderate fatty infiltration of the pancreas. Spleen: Normal in size without focal abnormality. Adrenals/Urinary Tract: Large right adrenal heterogeneous mass measuring at least 4.2 x 5.6 x 5.6 cm, not significantly changed. Kidneys are unremarkable without evidence of nephrolithiasis or hydronephrosis. Urinary bladder is unremarkable. Stomach/Bowel: Stomach is within normal limits. Appendix appears normal. No evidence of bowel wall thickening, distention, or inflammatory changes. Vascular/Lymphatic: Aortic atherosclerosis. Unchanged infrarenal abdominal aortic aneurysm measuring up to 2.7 x 2.7 cm. Follow-up examination every 5 years is recommended. No enlarged abdominal or pelvic lymph  nodes. Reproductive: Prostate is unremarkable. Other: Fat containing left inguinal hernia. No abdominopelvic ascites. Musculoskeletal: New compression deformity of the T12 vertebral body with approximately 50% anterior vertebral body height loss. There is also new superior endplate compression deformity of L3 vertebral body. Chronic superior endplate deformities of L2 and L5 are unchanged. IMPRESSION: 1. New compression deformity of T12 vertebral body with approximately 50% anterior vertebral body height loss. 2.  New superior endplate deformity of L3 vertebral body. 3. Stable appearance of the right adrenal mass measuring 4.2 x 5.6 cm. 4.  Additional chronic findings as above. Electronically Signed   By: Keane Police D.O.   On: 08/20/2021 14:01   CT Angio Chest PE W and/or Wo Contrast  Result Date: 08/20/2021 CLINICAL DATA:  Lower back pain.  Increased shortness of breath. EXAM: CT ANGIOGRAPHY CHEST WITH CONTRAST TECHNIQUE: Multidetector CT imaging of the chest was performed using the standard protocol during bolus administration of intravenous contrast. Multiplanar CT image reconstructions and MIPs were obtained to evaluate the vascular anatomy. RADIATION DOSE REDUCTION: This exam was performed according to the departmental dose-optimization program which includes automated exposure control, adjustment of the mA and/or kV according to patient size and/or use of iterative reconstruction technique. CONTRAST:  152mL OMNIPAQUE IOHEXOL 350 MG/ML SOLN COMPARISON:  CT examination dated Jul 14, 2021 FINDINGS: Cardiovascular: Satisfactory opacification of the pulmonary arteries to the segmental level. No evidence of pulmonary embolism. Normal heart size. No pericardial effusion. Mediastinum/Nodes: No enlarged mediastinal, hilar, or axillary lymph nodes. Thyroid gland, trachea, and esophagus demonstrate no significant findings. Lungs/Pleura: Right perihilar fibrosis/atelectasis, unchanged from prior examination  suggesting postradiation changes. Multiple small thin walled cysts. Diffuse ground-glass attenuation of the lung parenchyma with bilateral peripheral fibrosis consistent with nonspecific interstitial pneumonia, unchanged. No evidence of pneumothorax or large pleural effusion. Upper Abdomen: Cholelithiasis without  evidence of acute cholecystitis. Moderate pancreatic atrophy. No acute abnormality. Musculoskeletal: Osteopenia and mild degenerate disc disease of the thoracic spine. No acute osseous abnormality. Review of the MIP images confirms the above findings. IMPRESSION: 1.  No evidence of pulmonary embolism. 2. Fibrosis/cystic changes and atelectasis of the right hilar region, most consistent with postradiation changes, not significantly changed from prior examination. 3. 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 4.  No acute upper abdominal process. Aortic Atherosclerosis (ICD10-I70.0) and Emphysema (ICD10-J43.9). Electronically Signed   By: Keane Police D.O.   On: 08/20/2021 13:46   DG Chest Portable 1 View  Result Date: 08/20/2021 CLINICAL DATA:  Short of breath.  History of lung cancer EXAM: PORTABLE CHEST 1 VIEW COMPARISON:  08/02/2021.  Chest CT 07/14/2021 FINDINGS: Right perihilar and right lower lobe airspace disease with mild progression. Elevated right hemidiaphragm. No significant effusion or pneumothorax on the right Left lung remains clear.  Negative for heart failure. IMPRESSION: Mild progression of right lower lobe airspace disease. Electronically Signed   By: Franchot Gallo M.D.   On: 08/20/2021 12:03   VAS [place] LOWER EXTREMITY VENOUS (DVT)  Result Date: 08/05/2021  Lower Venous DVT Study Patient Name:  [facility]  Date of Exam:   08/05/2021 Medical Rec #: [id number]      Accession #:    [id number] Date of Birth: [birth year]      Patient Gender: M Patient Age:   23 years Exam Location:  [facility]  Procedure:      VAS [place] LOWER EXTREMITY VENOUS (DVT) Referring Phys: PRANAV PATEL --------------------------------------------------------------------------------  Indications: Edema.  Risk Factors: Cancer. Comparison Study: No prior studies. Performing Technologist: Oliver Hum RVT  Examination Guidelines: A complete evaluation includes B-mode imaging, spectral Doppler, color Doppler, and power Doppler as needed of all accessible portions of each vessel. Bilateral testing is considered an integral part of a complete examination. Limited examinations for reoccurring indications may be performed as noted. The reflux portion of the exam is performed with the patient in reverse Trendelenburg.  +---------+---------------+---------+-----------+----------+--------------+ RIGHT    CompressibilityPhasicitySpontaneityPropertiesThrombus Aging +---------+---------------+---------+-----------+----------+--------------+ CFV      Full           Yes      Yes                                 +---------+---------------+---------+-----------+----------+--------------+ SFJ      Full                                                        +---------+---------------+---------+-----------+----------+--------------+ FV Prox  Full                                                        +---------+---------------+---------+-----------+----------+--------------+ FV Mid   Full                                                        +---------+---------------+---------+-----------+----------+--------------+  FV DistalFull                                                        +---------+---------------+---------+-----------+----------+--------------+ PFV      Full                                                        +---------+---------------+---------+-----------+----------+--------------+ POP      Full           Yes      Yes                                  +---------+---------------+---------+-----------+----------+--------------+ PTV      Full                                                        +---------+---------------+---------+-----------+----------+--------------+ PERO     Full                                                        +---------+---------------+---------+-----------+----------+--------------+   +----+---------------+---------+-----------+----------+--------------+ LEFTCompressibilityPhasicitySpontaneityPropertiesThrombus Aging +----+---------------+---------+-----------+----------+--------------+ CFV Full           Yes      Yes                                 +----+---------------+---------+-----------+----------+--------------+     Summary: RIGHT: - There is no evidence of deep vein thrombosis in the lower extremity.  - No cystic structure found in the popliteal fossa.  LEFT: - No evidence of common femoral vein obstruction.  *See table(s) above for measurements and observations. Electronically signed by Harold Barban MD on 08/05/2021 at 9:24:38 PM.    Final    ECHOCARDIOGRAM COMPLETE  Result Date: 08/02/2021    ECHOCARDIOGRAM REPORT   Patient Name:   [facility] Date of Exam: 08/02/2021 Medical Rec #:  [id number]     Height:       71.0 in Accession #:    [id number]    Weight:       231.2 lb Date of Birth:  1960-10-26     BSA:          2.243 m Patient Age:    84 years      BP:           97/72 mmHg Patient Gender: M             HR:           120 bpm. Exam Location:  Inpatient Procedure: 2D Echo, Cardiac Doppler, Color Doppler and Intracardiac            Opacification Agent Indications:    Atrial Fibrillation I48.91  History:  Patient has prior history of Echocardiogram examinations, most                 recent 10/13/2020. CAD, COPD, Arrythmias:Atrial Fibrillation;                 Risk Factors:Dyslipidemia and Former Smoker. Lung cancer.                 Drug-induced pneumonitis.  Sonographer:    Darlina Sicilian  RDCS Referring Phys: [id number] [place]  1. Left ventricular ejection fraction, by estimation, is 60 to 65%. The left ventricle has normal function. Left ventricular endocardial border not optimally defined to evaluate regional wall motion even with definity contrast. Left ventricular diastolic function could not be evaluated.  2. Right ventricular systolic function was not well visualized. The right ventricular size is not well visualized. Tricuspid regurgitation signal is inadequate for assessing PA pressure.  3. The mitral valve is normal in structure. No evidence of mitral valve regurgitation. No evidence of mitral stenosis.  4. The aortic valve is normal in structure. Aortic valve regurgitation is not visualized. No aortic stenosis is present.  5. Aortic dilatation noted. There is mild dilatation of the aortic root, measuring 40 mm.  6. The inferior vena cava is normal in size with greater than 50% respiratory variability, suggesting right atrial pressure of 3 mmHg. FINDINGS  Left Ventricle: Left ventricular ejection fraction, by estimation, is 60 to 65%. The left ventricle has normal function. Left ventricular endocardial border not optimally defined to evaluate regional wall motion. Definity contrast agent was given IV to delineate the left ventricular endocardial borders. The left ventricular internal cavity size was normal in size. There is no left ventricular hypertrophy. Left ventricular diastolic function could not be evaluated due to atrial fibrillation. Left ventricular diastolic function could not be evaluated. Right Ventricle: The right ventricular size is not well visualized. Right vetricular wall thickness was not assessed. Right ventricular systolic function was not well visualized. Tricuspid regurgitation signal is inadequate for assessing PA pressure. Left Atrium: Left atrial size was not well visualized. Right Atrium: Right atrial size was not well visualized. Pericardium:  There is no evidence of pericardial effusion. Mitral Valve: The mitral valve is normal in structure. No evidence of mitral valve regurgitation. No evidence of mitral valve stenosis. Tricuspid Valve: The tricuspid valve is normal in structure. Tricuspid valve regurgitation is not demonstrated. No evidence of tricuspid stenosis. Aortic Valve: The aortic valve is normal in structure. Aortic valve regurgitation is not visualized. No aortic stenosis is present. Pulmonic Valve: The pulmonic valve was normal in structure. Pulmonic valve regurgitation is not visualized. No evidence of pulmonic stenosis. Aorta: Aortic dilatation noted. There is mild dilatation of the aortic root, measuring 40 mm. Venous: The inferior vena cava is normal in size with greater than 50% respiratory variability, suggesting right atrial pressure of 3 mmHg. IAS/Shunts: No atrial level shunt detected by color flow Doppler.  LEFT VENTRICLE PLAX 2D LVIDd:         5.20 cm LVIDs:         3.50 cm LV PW:         1.20 cm LV IVS:        1.10 cm LVOT diam:     2.30 cm LVOT Area:     4.15 cm  LEFT ATRIUM         Index LA diam:    3.80 cm 1.69 cm/m   AORTA Ao Root diam: 4.00 cm  MITRAL VALVE MV Area (PHT): 3.70 cm    SHUNTS MV Decel Time: 205 msec    Systemic Diam: 2.30 cm MV E velocity: 69.40 cm/s Fransico Him MD Electronically signed by Fransico Him MD Signature Date/Time: 08/02/2021/12:50:28 PM    Final    DG Chest Port 1 View  Result Date: 08/02/2021 CLINICAL DATA:  Heart palpitations today.  History of lung cancer. EXAM: PORTABLE CHEST 1 VIEW COMPARISON:  07/20/2021 FINDINGS: Heart size and pulmonary vascularity are normal. Right hilar mass similar to prior study likely corresponding to known cancer and/or treatment changes. Probable pleural thickening on the right. Left lung is clear. Probable emphysematous changes. IMPRESSION: Stable appearance of the chest since previous study. Right perihilar mass corresponding to known cancer in treatment  changes. Right pleural thickening. Electronically Signed   By: Lucienne Capers M.D.   On: 08/02/2021 02:08     MR Lumbar Spine W Wo Contrast  Result Date: 08/20/2021 CLINICAL DATA:  Back pain, compression fractures on CT EXAM: MRI LUMBAR SPINE WITHOUT AND WITH CONTRAST TECHNIQUE: Multiplanar and multiecho pulse sequences of the lumbar spine were obtained without and with intravenous contrast. CONTRAST:  75mL GADAVIST GADOBUTROL 1 MMOL/ML IV SOLN COMPARISON:  Correlation made with CT abdomen pelvis 07/14/2021 FINDINGS: Segmentation:  Standard. Alignment: No significant listhesis. No significant endplate retropulsion associated with compression fractures. Levocurvature. Vertebrae: Chronic compression fractures at L2, L4, and L5. New T12 compression fracture with approximately 50% loss of vertebral body height. Fracture lines are visible with STIR hyperintensity. There is mild marrow edema underlying the endplate. New L3 compression fracture with nearly 50% loss of height centrally. There is underlying mild marrow edema. There is enhancement at T12 and L3. Conus medullaris and cauda equina: Conus extends to the T12 level. Conus and cauda equina appear normal. Paraspinal and other soft tissues: Unremarkable. Disc levels: L1-L2: Disc bulge. Minor facet arthropathy. No significant canal or foraminal stenosis. L2-L3: Disc bulge. Minor facet arthropathy. Mild canal stenosis. Minor foraminal stenosis. L3-L4: Disc bulge. Minor facet arthropathy. Moderate canal stenosis. Slight effacement of subarticular recesses. Minor left foraminal stenosis. L4-L5: Disc bulge. Mild facet arthropathy with ligamentum flavum infolding. Moderate marked canal stenosis. Slight effacement of subarticular recesses. No significant foraminal stenosis. L5-S1: Disc bulge. Mild right and moderate left facet arthropathy. No significant canal or foraminal stenosis. IMPRESSION: Acute to subacute T12 and L3 compression fractures with approximately 50%  loss of vertebral body height. No definite evidence of underlying lesion. Given that there are other chronic compression fractures present, these are favored to be benign. Electronically Signed   By: Macy Mis M.D.   On: 08/20/2021 15:46   CT ABDOMEN PELVIS W CONTRAST  Result Date: 08/20/2021 CLINICAL DATA:  Metastatic disease, worsening right lower back pain. Rule out retroperitoneal bleed . EXAM: CT ABDOMEN AND PELVIS WITH CONTRAST TECHNIQUE: Multidetector CT imaging of the abdomen and pelvis was performed using the standard protocol following bolus administration of intravenous contrast. RADIATION DOSE REDUCTION: This exam was performed according to the departmental dose-optimization program which includes automated exposure control, adjustment of the mA and/or kV according to patient size and/or use of iterative reconstruction technique. CONTRAST:  165mL OMNIPAQUE IOHEXOL 350 MG/ML SOLN COMPARISON:  None Available. FINDINGS: Lower chest: Chronic fibrosis/atelectasis of the right lung, not significantly changed. Hepatobiliary: No focal liver abnormality is seen. Rim calcified gallstone without gallbladder wall thickening, or biliary dilatation. Pancreas: Moderate fatty infiltration of the pancreas. Spleen: Normal in size without focal abnormality. Adrenals/Urinary Tract: Large right adrenal heterogeneous  mass measuring at least 4.2 x 5.6 x 5.6 cm, not significantly changed. Kidneys are unremarkable without evidence of nephrolithiasis or hydronephrosis. Urinary bladder is unremarkable. Stomach/Bowel: Stomach is within normal limits. Appendix appears normal. No evidence of bowel wall thickening, distention, or inflammatory changes. Vascular/Lymphatic: Aortic atherosclerosis. Unchanged infrarenal abdominal aortic aneurysm measuring up to 2.7 x 2.7 cm. Follow-up examination every 5 years is recommended. No enlarged abdominal or pelvic lymph nodes. Reproductive: Prostate is unremarkable. Other: Fat containing  left inguinal hernia. No abdominopelvic ascites. Musculoskeletal: New compression deformity of the T12 vertebral body with approximately 50% anterior vertebral body height loss. There is also new superior endplate compression deformity of L3 vertebral body. Chronic superior endplate deformities of L2 and L5 are unchanged. IMPRESSION: 1. New compression deformity of T12 vertebral body with approximately 50% anterior vertebral body height loss. 2.  New superior endplate deformity of L3 vertebral body. 3. Stable appearance of the right adrenal mass measuring 4.2 x 5.6 cm. 4.  Additional chronic findings as above. Electronically Signed   By: Keane Police D.O.   On: 08/20/2021 14:01   CT Angio Chest PE W and/or Wo Contrast  Result Date: 08/20/2021 CLINICAL DATA:  Lower back pain.  Increased shortness of breath. EXAM: CT ANGIOGRAPHY CHEST WITH CONTRAST TECHNIQUE: Multidetector CT imaging of the chest was performed using the standard protocol during bolus administration of intravenous contrast. Multiplanar CT image reconstructions and MIPs were obtained to evaluate the vascular anatomy. RADIATION DOSE REDUCTION: This exam was performed according to the departmental dose-optimization program which includes automated exposure control, adjustment of the mA and/or kV according to patient size and/or use of iterative reconstruction technique. CONTRAST:  177mL OMNIPAQUE IOHEXOL 350 MG/ML SOLN COMPARISON:  CT examination dated Jul 14, 2021 FINDINGS: Cardiovascular: Satisfactory opacification of the pulmonary arteries to the segmental level. No evidence of pulmonary embolism. Normal heart size. No pericardial effusion. Mediastinum/Nodes: No enlarged mediastinal, hilar, or axillary lymph nodes. Thyroid gland, trachea, and esophagus demonstrate no significant findings. Lungs/Pleura: Right perihilar fibrosis/atelectasis, unchanged from prior examination suggesting postradiation changes. Multiple small thin walled cysts. Diffuse  ground-glass attenuation of the lung parenchyma with bilateral peripheral fibrosis consistent with nonspecific interstitial pneumonia, unchanged. No evidence of pneumothorax or large pleural effusion. Upper Abdomen: Cholelithiasis without evidence of acute cholecystitis. Moderate pancreatic atrophy. No acute abnormality. Musculoskeletal: Osteopenia and mild degenerate disc disease of the thoracic spine. No acute osseous abnormality. Review of the MIP images confirms the above findings. IMPRESSION: 1.  No evidence of pulmonary embolism. 2. Fibrosis/cystic changes and atelectasis of the right hilar region, most consistent with postradiation changes, not significantly changed from prior examination. 3. 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 4.  No acute upper abdominal process. Aortic Atherosclerosis (ICD10-I70.0) and Emphysema (ICD10-J43.9). Electronically Signed   By: Keane Police D.O.   On: 08/20/2021 13:46   DG Chest Portable 1 View  Result Date: 08/20/2021 CLINICAL DATA:  Short of breath.  History of lung cancer EXAM: PORTABLE CHEST 1 VIEW COMPARISON:  08/02/2021.  Chest CT 07/14/2021 FINDINGS: Right perihilar and right lower lobe airspace disease with mild progression. Elevated right hemidiaphragm. No significant effusion or pneumothorax on the right Left lung remains clear.  Negative for heart failure. IMPRESSION: Mild progression of right lower lobe airspace disease. Electronically Signed   By: Franchot Gallo M.D.   On: 08/20/2021 12:03   VAS [place] LOWER EXTREMITY VENOUS (DVT)  Result Date: 08/05/2021  Lower Venous DVT  Study Patient Name:  NICASIO BARLOWE  Date of Exam:   08/05/2021 Medical Rec #: [id number]      Accession #:    [id number] Date of Birth: 1960/05/08      Patient Gender: M Patient Age:   52 years Exam Location:  [facility] Procedure:      VAS [place] LOWER EXTREMITY VENOUS (DVT) Referring Phys: PRANAV PATEL  --------------------------------------------------------------------------------  Indications: Edema.  Risk Factors: Cancer. Comparison Study: No prior studies. Performing Technologist: Oliver Hum RVT  Examination Guidelines: A complete evaluation includes B-mode imaging, spectral Doppler, color Doppler, and power Doppler as needed of all accessible portions of each vessel. Bilateral testing is considered an integral part of a complete examination. Limited examinations for reoccurring indications may be performed as noted. The reflux portion of the exam is performed with the patient in reverse Trendelenburg.  +---------+---------------+---------+-----------+----------+--------------+ RIGHT    CompressibilityPhasicitySpontaneityPropertiesThrombus Aging +---------+---------------+---------+-----------+----------+--------------+ CFV      Full           Yes      Yes                                 +---------+---------------+---------+-----------+----------+--------------+ SFJ      Full                                                        +---------+---------------+---------+-----------+----------+--------------+ FV Prox  Full                                                        +---------+---------------+---------+-----------+----------+--------------+ FV Mid   Full                                                        +---------+---------------+---------+-----------+----------+--------------+ FV DistalFull                                                        +---------+---------------+---------+-----------+----------+--------------+ PFV      Full                                                        +---------+---------------+---------+-----------+----------+--------------+ POP      Full           Yes      Yes                                 +---------+---------------+---------+-----------+----------+--------------+ PTV      Full                                                         +---------+---------------+---------+-----------+----------+--------------+  PERO     Full                                                        +---------+---------------+---------+-----------+----------+--------------+   +----+---------------+---------+-----------+----------+--------------+ LEFTCompressibilityPhasicitySpontaneityPropertiesThrombus Aging +----+---------------+---------+-----------+----------+--------------+ CFV Full           Yes      Yes                                 +----+---------------+---------+-----------+----------+--------------+     Summary: RIGHT: - There is no evidence of deep vein thrombosis in the lower extremity.  - No cystic structure found in the popliteal fossa.  LEFT: - No evidence of common femoral vein obstruction.  *See table(s) above for measurements and observations. Electronically signed by Harold Barban MD on 08/05/2021 at 9:24:38 PM.    Final    ECHOCARDIOGRAM COMPLETE  Result Date: 08/02/2021    ECHOCARDIOGRAM REPORT   Patient Name:   [facility] Date of Exam: 08/02/2021 Medical Rec #:  [id number]     Height:       71.0 in Accession #:    [id number]    Weight:       231.2 lb Date of Birth:  May 28, 1960     BSA:          2.243 m Patient Age:    53 years      BP:           97/72 mmHg Patient Gender: M             HR:           120 bpm. Exam Location:  Inpatient Procedure: 2D Echo, Cardiac Doppler, Color Doppler and Intracardiac            Opacification Agent Indications:    Atrial Fibrillation I48.91  History:        Patient has prior history of Echocardiogram examinations, most                 recent 10/13/2020. CAD, COPD, Arrythmias:Atrial Fibrillation;                 Risk Factors:Dyslipidemia and Former Smoker. Lung cancer.                 Drug-induced pneumonitis.  Sonographer:    Darlina Sicilian RDCS Referring Phys: [id number] [place]  1. Left ventricular ejection fraction, by  estimation, is 60 to 65%. The left ventricle has normal function. Left ventricular endocardial border not optimally defined to evaluate regional wall motion even with definity contrast. Left ventricular diastolic function could not be evaluated.  2. Right ventricular systolic function was not well visualized. The right ventricular size is not well visualized. Tricuspid regurgitation signal is inadequate for assessing PA pressure.  3. The mitral valve is normal in structure. No evidence of mitral valve regurgitation. No evidence of mitral stenosis.  4. The aortic valve is normal in structure. Aortic valve regurgitation is not visualized. No aortic stenosis is present.  5. Aortic dilatation noted. There is mild dilatation of the aortic root, measuring 40 mm.  6. The inferior vena cava is normal in size with greater than 50% respiratory variability, suggesting right atrial pressure of 3 mmHg. FINDINGS  Left Ventricle: Left  ventricular ejection fraction, by estimation, is 60 to 65%. The left ventricle has normal function. Left ventricular endocardial border not optimally defined to evaluate regional wall motion. Definity contrast agent was given IV to delineate the left ventricular endocardial borders. The left ventricular internal cavity size was normal in size. There is no left ventricular hypertrophy. Left ventricular diastolic function could not be evaluated due to atrial fibrillation. Left ventricular diastolic function could not be evaluated. Right Ventricle: The right ventricular size is not well visualized. Right vetricular wall thickness was not assessed. Right ventricular systolic function was not well visualized. Tricuspid regurgitation signal is inadequate for assessing PA pressure. Left Atrium: Left atrial size was not well visualized. Right Atrium: Right atrial size was not well visualized. Pericardium: There is no evidence of pericardial effusion. Mitral Valve: The mitral valve is normal in structure. No  evidence of mitral valve regurgitation. No evidence of mitral valve stenosis. Tricuspid Valve: The tricuspid valve is normal in structure. Tricuspid valve regurgitation is not demonstrated. No evidence of tricuspid stenosis. Aortic Valve: The aortic valve is normal in structure. Aortic valve regurgitation is not visualized. No aortic stenosis is present. Pulmonic Valve: The pulmonic valve was normal in structure. Pulmonic valve regurgitation is not visualized. No evidence of pulmonic stenosis. Aorta: Aortic dilatation noted. There is mild dilatation of the aortic root, measuring 40 mm. Venous: The inferior vena cava is normal in size with greater than 50% respiratory variability, suggesting right atrial pressure of 3 mmHg. IAS/Shunts: No atrial level shunt detected by color flow Doppler.  LEFT VENTRICLE PLAX 2D LVIDd:         5.20 cm LVIDs:         3.50 cm LV PW:         1.20 cm LV IVS:        1.10 cm LVOT diam:     2.30 cm LVOT Area:     4.15 cm  LEFT ATRIUM         Index LA diam:    3.80 cm 1.69 cm/m   AORTA Ao Root diam: 4.00 cm MITRAL VALVE MV Area (PHT): 3.70 cm    SHUNTS MV Decel Time: 205 msec    Systemic Diam: 2.30 cm MV E velocity: 69.40 cm/s Fransico Him MD Electronically signed by Fransico Him MD Signature Date/Time: 08/02/2021/12:50:28 PM    Final    DG Chest Port 1 View  Result Date: 08/02/2021 CLINICAL DATA:  Heart palpitations today.  History of lung cancer. EXAM: PORTABLE CHEST 1 VIEW COMPARISON:  07/20/2021 FINDINGS: Heart size and pulmonary vascularity are normal. Right hilar mass similar to prior study likely corresponding to known cancer and/or treatment changes. Probable pleural thickening on the right. Left lung is clear. Probable emphysematous changes. IMPRESSION: Stable appearance of the chest since previous study. Right perihilar mass corresponding to known cancer in treatment changes. Right pleural thickening. Electronically Signed   By: Lucienne Capers M.D.   On: 08/02/2021 02:08     Assessment and Plan:  1.  Metastatic squamous cell carcinoma of the lung 2.  Back pain secondary to T12 compression fracture 3.  PAF 4.  Chronic hypoxic respiratory failure, COPD, asthma 5.  CAD 6.  Hyperlipidemia 7.  Mild anemia  -The patient has metastatic lung cancer.  He is receiving palliative radiation to his right adrenal gland.  He will continue radiation under the care of Dr. [name].  Plan is to administer gemcitabine/Navelbine following completion of radiation. -The patient has a painful T12  compression fracture.  Recommend IR evaluation for consideration of vertebroplasty.  I have adjusted his oxycodone to 5 to 10 mg every 4 hours as needed for pain and to continue Dilaudid 0.5 mg IV every 4 hours as needed for severe pain.  Adjust pain medication as needed. -Management of chronic medical conditions per hospitalist.  Mikey Bussing, DNP, AGPCNP-BC, AOCNP   ADDENDUM: I saw and examined Mr. Weisensel this morning.  This compression fracture is clearly from steroid use.  It does not appear to be anything malignant.  He definitely needs to have vertebroplasty.  I think that we need to get Interventional Radiology involved.  I am sure they will be able to do a vertebroplasty on him.  He is on Eliquis.  For right now, I would stop the Eliquis.  He can get on heparin or Lovenox.  Our goal here is quality of life.  He really needs to be able to ambulate.  I do have things set up for chemotherapy for his metastatic non-small cell lung cancer.  I am waiting to for him to complete radiation therapy.  Hopefully, he will be able to have a procedure done this week.  We will follow along and help out any way that we can.  I know he will get incredible care from all the staff on [place] ,MD  Philippians 4:6

## 2021-08-24 NOTE — Evaluation (Signed)
Occupational Therapy Evaluation Patient Details Name: John [name] MRN: [id number] DOB: 03-27-1960 Today's Date: 08/24/2021   History of Present Illness Patient is a 61 year old male hwo presented to the hosptial on 7/9 with back pain. of note patient had recently presented to the ED on 7/6 with findings of new T12 fx with conservative recommendations. patient was unable to control pain and move around at home prompting trip back to hospital. PMH: a fib, GERD, metastatic lung cancer, obesity, orthostatic hypotension.   Clinical Impression   Patient is a 61 year old male who was admitted for above. Patient was extremely limited by pain in back and in area of adrenal gland. Patient reports that pain gets worse with each radiation treatment. Nurse made aware. Patient was able to sit up on edge of bed for about 1 min with increased pain 10/10. Patient was educated on back precautions and proper body mechanics in bed. Patient was noted to have decreased functional activity tolerance, decreased endurance, decreased standing balance, decreased safety awareness, and decreased knowledge of AD/AE impacting participation in ADLs. Patient was noted to have decreased functional activity tolerance, decreased endurance, decreased standing balance, decreased safety awareness, and decreased knowledge of AD/AE impacting participation in ADLs.        Recommendations for follow up therapy are one component of a multi-disciplinary discharge planning process, led by the attending physician.  Recommendations may be updated based on patient status, additional functional criteria and insurance authorization.   Follow Up Recommendations  Skilled nursing-short term rehab (<3 hours/day)    Assistance Recommended at Discharge Frequent or constant Supervision/Assistance  Patient can return home with the following A lot of help with walking and/or transfers;A lot of help with bathing/dressing/bathroom;Assistance with  cooking/housework;Direct supervision/assist for financial management;Assist for transportation;Help with stairs or ramp for entrance;Direct supervision/assist for medications management    Functional Status Assessment  Patient has had a recent decline in their functional status and demonstrates the ability to make significant improvements in function in a reasonable and predictable amount of time.  Equipment Recommendations  Other (comment) (TBD)    Recommendations for Other Services       Precautions / Restrictions Precautions Precautions: Back Precaution Comments: back precautions Restrictions Weight Bearing Restrictions: No      Mobility Bed Mobility Overal bed mobility: Needs Assistance Bed Mobility: Supine to Sit     Supine to sit: Min assist     General bed mobility comments: with continued educaiton on log rolling and proper body mechanics in bed.    Transfers                   General transfer comment: unable to tolerate attempt at standing today      Balance Overall balance assessment: Needs assistance Sitting-balance support: Bilateral upper extremity supported, Feet supported Sitting balance-Leahy Scale: Poor Sitting balance - Comments: needed BUE and BLE support for sitting balance.                                   ADL either performed or assessed with clinical judgement   ADL Overall ADL's : Needs assistance/impaired Eating/Feeding: Set up;Bed level   Grooming: Set up;Bed level   Upper Body Bathing: Bed level;Maximal assistance   Lower Body Bathing: Total assistance;Bed level   Upper Body Dressing : Maximal assistance;Bed level   Lower Body Dressing: Total assistance;Bed level     Toilet Transfer Details (indicate  cue type and reason): patient was able to participate in supine to sit on edge of bed with education on proper body mechanics and how to avoid breaking back precautions. patient was able to tolerate sitting EOB  for about 1 min with increased pain reported of 10/10 with feeling like he will pass out.patient repoted TLSO brace puts too much pressure on "adrenal gland area from radiation" and makes pain worse.  patient returned to supine. patients son was present during session.                 Vision Patient Visual Report: No change from baseline       Perception     Praxis      Pertinent Vitals/Pain Pain Assessment Pain Assessment: 0-10 Pain Score: 10-Worst pain ever Pain Location: "adrenal gland" with movement Pain Descriptors / Indicators: Crying, Discomfort, Grimacing, Moaning Pain Intervention(s): Limited activity within patient's tolerance, Monitored during session, Repositioned, Premedicated before session, Patient requesting pain meds-RN notified     Hand Dominance     Extremity/Trunk Assessment Upper Extremity Assessment Upper Extremity Assessment:  (unable to fully assess with current pain levels appears to be [facility].)   Lower Extremity Assessment Lower Extremity Assessment: Defer to PT evaluation   Cervical / Trunk Assessment Cervical / Trunk Assessment: Normal   Communication     Cognition Arousal/Alertness: Awake/alert Behavior During Therapy: WFL for tasks assessed/performed Overall Cognitive Status: Within Functional Limits for tasks assessed                                 General Comments: patient's cog was difficult to assess with current pain levels and pain medications.     General Comments       Exercises     Shoulder Instructions      Home Living                                          Prior Functioning/Environment                          OT Problem List: Pain;Cardiopulmonary status limiting activity;Decreased knowledge of precautions;Decreased knowledge of use of DME or AE;Decreased activity tolerance;Impaired balance (sitting and/or standing);Decreased safety awareness      OT  Treatment/Interventions: Self-care/ADL training;Therapeutic exercise;Neuromuscular education;Energy conservation;DME and/or AE instruction;Therapeutic activities;Balance training;Patient/family education    OT Goals(Current goals can be found in the care plan section) Acute Rehab OT Goals Patient Stated Goal: to get pain under control OT Goal Formulation: With patient Time For Goal Achievement: 09/07/21 Potential to Achieve Goals: Good  OT Frequency: Min 2X/week    Co-evaluation              AM-PAC OT "6 Clicks" Daily Activity     Outcome Measure Help from another person eating meals?: A Little Help from another person taking care of personal grooming?: A Little Help from another person toileting, which includes using toliet, bedpan, or urinal?: Total Help from another person bathing (including washing, rinsing, drying)?: Total Help from another person to put on and taking off regular upper body clothing?: Total Help from another person to put on and taking off regular lower body clothing?: Total 6 Click Score: 10   End of Session Equipment Utilized During Treatment: Oxygen Nurse Communication: Patient requests pain  meds  Activity Tolerance: Patient limited by pain Patient left: in bed;with call bell/phone within reach;with bed alarm set  OT Visit Diagnosis: Unsteadiness on feet (R26.81);Pain;Muscle weakness (generalized) (M62.81)                Time: 8118-8677 OT Time Calculation (min): 25 min Charges:  OT General Charges $OT Visit: 1 Visit OT Evaluation $OT Eval Moderate Complexity: 1 Mod OT Treatments $Therapeutic Activity: 8-22 mins  Jackelyn Poling OTR/L, MS Acute Rehabilitation Department Office# [phone] Pager# [phone]   Marcellina Millin 08/24/2021, 3:51 PM

## 2021-08-25 ENCOUNTER — Ambulatory Visit: Payer: 59

## 2021-08-25 DIAGNOSIS — C7971 Secondary malignant neoplasm of right adrenal gland: Secondary | ICD-10-CM | POA: Diagnosis not present

## 2021-08-25 DIAGNOSIS — C3411 Malignant neoplasm of upper lobe, right bronchus or lung: Secondary | ICD-10-CM | POA: Diagnosis not present

## 2021-08-25 DIAGNOSIS — C779 Secondary and unspecified malignant neoplasm of lymph node, unspecified: Secondary | ICD-10-CM | POA: Diagnosis not present

## 2021-08-25 DIAGNOSIS — I2510 Atherosclerotic heart disease of native coronary artery without angina pectoris: Secondary | ICD-10-CM | POA: Diagnosis not present

## 2021-08-25 DIAGNOSIS — J449 Chronic obstructive pulmonary disease, unspecified: Secondary | ICD-10-CM | POA: Diagnosis not present

## 2021-08-25 DIAGNOSIS — S22080D Wedge compression fracture of T11-T12 vertebra, subsequent encounter for fracture with routine healing: Secondary | ICD-10-CM | POA: Diagnosis not present

## 2021-08-25 MED ORDER — SODIUM CHLORIDE 0.9 % IV SOLN
510.0000 mg | Freq: Once | INTRAVENOUS | Status: AC
  Administered 2021-08-25: 510 mg via INTRAVENOUS
  Filled 2021-08-25: qty 17

## 2021-08-25 MED ORDER — POLYETHYLENE GLYCOL 3350 17 G PO PACK
17.0000 g | PACK | Freq: Every day | ORAL | Status: DC
  Administered 2021-08-25: 17 g via ORAL
  Filled 2021-08-25 (×2): qty 1

## 2021-08-25 MED ORDER — MORPHINE SULFATE ER 15 MG PO TBCR
15.0000 mg | EXTENDED_RELEASE_TABLET | Freq: Two times a day (BID) | ORAL | Status: DC
  Administered 2021-08-25 – 2021-08-26 (×3): 15 mg via ORAL
  Filled 2021-08-25 (×3): qty 1

## 2021-08-25 MED ORDER — SENNOSIDES-DOCUSATE SODIUM 8.6-50 MG PO TABS
2.0000 | ORAL_TABLET | Freq: Two times a day (BID) | ORAL | Status: DC
  Administered 2021-08-25 – 2021-08-29 (×9): 2 via ORAL
  Filled 2021-08-25 (×9): qty 2

## 2021-08-25 MED ORDER — ZOLEDRONIC ACID 4 MG/5ML IV CONC
4.0000 mg | Freq: Once | INTRAVENOUS | Status: AC
  Administered 2021-08-25: 4 mg via INTRAVENOUS
  Filled 2021-08-25: qty 5

## 2021-08-25 NOTE — Progress Notes (Signed)
Patient ID: John [name], male   DOB: [birth year], 61 y.o.   MRN: [id number] Aware of request for possible KP on pt. Latest imaging studies have been reviewed by Dr. Laurence Ferrari and pt appears to be candidate for T12/L3 KP based on that review. Once we receive notification from pt's insurance regarding preauthorization we will f/u with pt and schedule accordingly. Pt updated.

## 2021-08-25 NOTE — Progress Notes (Signed)
PROGRESS NOTE  John [name] FXT:[id number] DOB: 1960-08-14 DOA: 08/23/2021 PCP: [name], MD   LOS: 1 day   Brief Narrative / Interim history: 61 year old male with history of metastatic lung cancer, PAF, RA, history of C. difficile comes into the hospital with back pain.  About couple weeks ago he hit himself against the washer, and following that he experienced back pain.  He went to the ED on July 6, was found to have a T12 compression fracture.  EDP discussed with neurosurgery who recommended TLSO brace, home discharge and outpatient follow-up.  Patient went home, tried oxycodone however has been having difficulties managing his ADLs, being on the couch most of the time and unable to get relief and decided to come back to the ER.  Subjective / 24h Interval events: Complains of severe pain, feels like pain becomes excruciating when he is asleep and does not ask for pain medications  Assesement and Plan: Principal Problem:   T12 compression fracture (HCC) Active Problems:   Paroxysmal atrial fibrillation with RVR (HCC)   Drug-induced pneumonitis   COPD with asthma (HCC)   Normocytic anemia   Coronary artery disease involving native coronary artery of native heart without angina pectoris   Stage 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 (HCC)   Mixed hyperlipidemia   Intractable back pain   Principal problem T12 compression fracture, intractable back pain-still requiring IV Dilaudid, along with oxycodone.  He does not feel like current regimen is working, because if he falls asleep and too much time has passed since prior pain medication he is in excruciating pain.  IR consulted for kyphoplasty, awaiting insurance preauthorization.  Add long-acting MS Contin this morning and see if that improves his overall pain level.  Hold Eliquis until we know from IR timing of procedure  Active problems PAF-continue amiodarone, started metoprolol at a lower dose given normotensive on  admission.  Eliquis on hold as above until IR evaluates.  Continue to monitor on telemetry  Chronic hypoxic respiratory failure on 3 L at home, COPD, asthma-this appears stable.  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  Vitamin D deficiency-replete  Normocytic anemia-due to underlying malignancy  Scheduled Meds:  amiodarone  200 mg Oral Daily   diclofenac Sodium  4 g Topical QID   feeding supplement  1 Container Oral Q24H   feeding supplement  237 mL Oral Q24H   lidocaine  1 patch Transdermal Q24H   metoprolol succinate  25 mg Oral Daily   morphine  15 mg Oral Q12H   multivitamin with minerals  1 tablet Oral Daily   PARoxetine  10 mg Oral QHS   polyethylene glycol  17 g Oral Daily   predniSONE  20 mg Oral Q breakfast   senna-docusate  2 tablet Oral BID   Vitamin D (Ergocalciferol)  50,000 Units Oral Q7 days   Continuous Infusions:  methocarbamol (ROBAXIN) IV     zoledronic acid (ZOMETA) 4 mg in sodium chloride 0.9 % 100 mL IVPB 4 mg (08/25/21 1208)   PRN Meds:.HYDROmorphone (DILAUDID) injection, methocarbamol **OR** methocarbamol (ROBAXIN) IV, oxyCODONE  Diet Orders (From admission, onward)     Start     Ordered   08/23/21 2023  Diet Heart Room service appropriate? Yes; Fluid consistency: Thin  Diet effective  now       Question Answer Comment  Room service appropriate? Yes   Fluid consistency: Thin      08/23/21 2022            DVT prophylaxis:  Eliquis on hold   Lab Results  Component Value Date   PLT 183 08/24/2021      Code Status: Full Code  Family Communication: no family at bedside   Status is: Inpatient  Persistent pain, failed outpatient management, IR to evaluate  Level of care:  Telemetry  Consultants:  IR Oncology   Objective: Vitals:   08/24/21 2042 08/25/21 0523 08/25/21 1125 08/25/21 1214  BP: 112/75 (!) 106/59 105/67 106/74  Pulse: 77 87    Resp: 20 20 18 18   Temp: 98.3 F (36.8 C) 97.9 F (36.6 C) 97.8 F (36.6 C)   TempSrc: Oral Oral Oral   SpO2: 92% 92% 90% 90%  Weight:      Height:        Intake/Output Summary (Last 24 hours) at 08/25/2021 1243 Last data filed at 08/25/2021 0855 Gross per 24 hour  Intake 720 ml  Output 1400 ml  Net -680 ml    Wt Readings from Last 3 Encounters:  08/23/21 101 kg  08/20/21 104.3 kg  08/13/21 103.4 kg    Examination:  Constitutional: NAD Eyes: lids and conjunctivae normal, no scleral icterus ENMT: mmm Neck: normal, supple Respiratory: clear to auscultation bilaterally, no wheezing, no crackles.  Cardiovascular: Regular rate and rhythm, no murmurs / rubs / gallops.  Abdomen: soft, no distention, no tenderness. Bowel sounds positive.  Skin: no rashes Neurologic: no focal deficits, equal strength   Data Reviewed: I have independently reviewed following labs and imaging studies  CBC Recent Labs  Lab 08/20/21 1139 08/23/21 1547 08/24/21 0422  WBC 8.4 7.2 6.7  HGB 12.0* 11.7* 11.1*  HCT 37.3* 35.3* 34.0*  PLT 234 191 183  MCV 87.6 86.5 87.6  MCH 28.2 28.7 28.6  MCHC 32.2 33.1 32.6  RDW 18.2* 17.4* 17.7*  LYMPHSABS 0.2* 0.2* 0.4*  MONOABS 0.5 0.4 0.5  EOSABS 0.0 0.0 0.1  BASOSABS 0.0 0.0 0.0     Recent Labs  Lab 08/20/21 1139 08/23/21 1547 08/23/21 1920 08/24/21 0422  NA 139 135  --  138  K 4.1 4.4  --  4.0  CL 103 98  --  99  CO2 28 29  --  31  GLUCOSE 146* 164*  --  107*  BUN 13 14  --  15  CREATININE 0.87 0.89  --  0.95  CALCIUM 9.0 8.5*  --  9.0  AST  --  13*  --  15  ALT  --  19  --  18  ALKPHOS  --  69  --  70  BILITOT  --  0.8  --  0.8  ALBUMIN  --  3.0*  --  3.1*  MG  --   --  2.0  --   INR 1.2  --   --   --   BNP 34.6  --   --   --       ------------------------------------------------------------------------------------------------------------------ No results for input(s): "CHOL", "HDL", "LDLCALC", "TRIG", "CHOLHDL", "LDLDIRECT" in the last 72 hours.  No results found for: "HGBA1C" ------------------------------------------------------------------------------------------------------------------ No results for input(s): "TSH", "T4TOTAL", "T3FREE", "THYROIDAB" in the last 72 hours.  Invalid input(s): "FREET3"  Cardiac Enzymes No results for input(s): "CKMB", "TROPONINI", "MYOGLOBIN" in the last 168 hours.  Invalid input(s): "CK" ------------------------------------------------------------------------------------------------------------------  Component Value Date/Time   BNP 34.6 08/20/2021 1139    CBG: No results for input(s): "GLUCAP" in the last 168 hours.  No results found for this or any previous visit (from the past 240 hour(s)).   Radiology Studies: No results found.   Marzetta Board, MD, PhD [facility]  Between 7 am - 7 pm I am available, please contact me via Amion (for emergencies) or Securechat (non urgent messages)  Between 7 pm - 7 am I am not available, please contact night coverage MD/APP via Amion

## 2021-08-25 NOTE — Progress Notes (Signed)
Overall, John [name] is about the same.  He says his pain is in the side where he has the adrenal tumor.  He says his back not bothering him all that much.  He still needs to have vertebroplasty for his spine.  Hopefully, Interventional Radiology will see him today for this.  I will give him a dose of Zometa to try to help.  He is in atrial fibrillation.  This is well controlled right now,.  There are no labs back yet for today.  Yesterday, his prealbumin was 16.9.  He did have a little bit of low iron saturation.  His ferritin 446 which is more acute phase reactant.  He has incredibly low vitamin D level.  He is restarted on vitamin D.  I do think he probably needs a dose of IV iron.  His appetite is marginal.  He has had no bowel movement.  Hopefully, again, interventional radiology will be able to help him out with this compression fracture.  It sounds like radiation therapy is on hold right now.  He is on amiodarone for the atrial fibrillation.  As far as his metastatic lung cancer goes, we are awaiting his chemotherapy to start once his radiation is finished.  We will continue to follow along.  [name], MD  Psalms 100:5

## 2021-08-25 NOTE — Evaluation (Signed)
Physical Therapy Evaluation Patient Details Name: John [name] MRN: [id number] DOB: 11/11/1960 Today's Date: 08/25/2021  History of Present Illness  Patient is a 61 year old male hwo presented to the hosptial on 7/9 with back pain. of note patient had recently presented to the ED on 7/6 with findings of new T12 fx with conservative recommendations. patient was unable to control pain and move around at home prompting trip back to hospital. PMH: a fib, GERD, metastatic lung cancer, obesity, orthostatic hypotension.   Clinical Impression  Pt is a 61 y.o. male with above HPI resulting in the deficits listed below (see PT Problem List). Pt is independent at baseline without use of AD and works from home full time. Pt performed sit to stand transfers and limited pre-gait side stepping with MIN guard for safety and use of RW. Hopeful that pt will progress well with mobility pending pain control. Pt will have assist from his wife upon d/c and will benefit from skilled PT to maximize safety with functional mobility and  increase independence.         Recommendations for follow up therapy are one component of a multi-disciplinary discharge planning process, led by the attending physician.  Recommendations may be updated based on patient status, additional functional criteria and insurance authorization.  Follow Up Recommendations Home health PT      Assistance Recommended at Discharge Frequent or constant Supervision/Assistance  Patient can return home with the following  A little help with walking and/or transfers;A lot of help with bathing/dressing/bathroom;Assist for transportation;Help with stairs or ramp for entrance;Assistance with cooking/housework    Equipment Recommendations None recommended by PT (pt owns RW)  Recommendations for Other Services       Functional Status Assessment Patient has had a recent decline in their functional status and demonstrates the ability to make significant  improvements in function in a reasonable and predictable amount of time.     Precautions / Restrictions Precautions Precautions: Back Precaution Comments: Pt reports he got TLSO brace when presented to ED on 7/6 but does not recall receiving specific wear schedule/instructions (OOB vs. at all times, etc). No TLSO orders in chart on day of eval. Restrictions Weight Bearing Restrictions: No      Mobility  Bed Mobility Overal bed mobility: Needs Assistance Bed Mobility: Sidelying to Sit, Sit to Sidelying   Sidelying to sit: Min guard, HOB elevated     Sit to sidelying: Min guard, HOB elevated General bed mobility comments: Pt in L sidelying upon entry for comfort. increased time, use of bed rails. Education on log roll techique.    Transfers Overall transfer level: Needs assistance Equipment used: Rolling walker (2 wheels) Transfers: Sit to/from Stand Sit to Stand: Min guard           General transfer comment: x2 from EOB with increased time to rise due to pain. Able to take x1 step forward and lateral side steps toward [facility] on 2nd sit to stand. Rest break required between trials due to pain    Ambulation/Gait                  Stairs            Wheelchair Mobility    Modified Rankin (Stroke Patients Only)       Balance Overall balance assessment: Needs assistance Sitting-balance support: Bilateral upper extremity supported Sitting balance-Leahy Scale: Fair     Standing balance support: Bilateral upper extremity supported, During functional activity, Reliant on assistive device  for balance Standing balance-Leahy Scale: Poor Standing balance comment: reliant on external support of RW                             Pertinent Vitals/Pain Pain Assessment Pain Assessment: 0-10 Pain Score: 5  Pain Location: "adrenal gland"  and mid-low back with movement Pain Descriptors / Indicators: Discomfort, Spasm, Sore Pain Intervention(s): Limited  activity within patient's tolerance, Monitored during session, Premedicated before session, Repositioned    Home Living Family/patient expects to be discharged to:: Private residence Living Arrangements: Spouse/significant other Available Help at Discharge: Family Type of Home: House Home Access: Stairs to enter Entrance Stairs-Rails: Left Entrance Stairs-Number of Steps: 4   Home Layout: One level Home Equipment: Conservation officer, nature (2 wheels);Wheelchair - manual;Shower seat;BSC/3in1 Additional Comments: work from home. Wife works but can work from home if needed. Been sleeping on couch for about 1wk. On 3-4L O2 at home. Has been wearing TLSO OOB but complains of having difficulty when sitting if wearing brace.    Prior Function Prior Level of Function : Independent/Modified Independent             Mobility Comments: no AD at baseline. Got TLSO following initial incident when injured back (reports was not instructed on orders for frequency of TLSO use)       Hand Dominance   Dominant Hand: Right    Extremity/Trunk Assessment   Upper Extremity Assessment Upper Extremity Assessment: Defer to OT evaluation    Lower Extremity Assessment Lower Extremity Assessment: Generalized weakness    Cervical / Trunk Assessment Cervical / Trunk Assessment: Normal  Communication   Communication: No difficulties  Cognition Arousal/Alertness: Awake/alert Behavior During Therapy: WFL for tasks assessed/performed Overall Cognitive Status: Within Functional Limits for tasks assessed                                          General Comments      Exercises     Assessment/Plan    PT Assessment Patient needs continued PT services  PT Problem List Decreased strength;Decreased range of motion;Decreased activity tolerance;Decreased balance;Decreased mobility;Decreased coordination;Decreased knowledge of use of DME;Pain       PT Treatment Interventions DME  instruction;Gait training;Stair training;Functional mobility training;Therapeutic activities;Therapeutic exercise;Balance training;Patient/family education    PT Goals (Current goals can be found in the Care Plan section)  Acute Rehab PT Goals Patient Stated Goal: decrease pain levels and go back home PT Goal Formulation: With patient Time For Goal Achievement: 09/08/21 Potential to Achieve Goals: Good    Frequency Min 3X/week     Co-evaluation               AM-PAC PT "6 Clicks" Mobility  Outcome Measure Help needed turning from your back to your side while in a flat bed without using bedrails?: A Little Help needed moving from lying on your back to sitting on the side of a flat bed without using bedrails?: A Lot Help needed moving to and from a bed to a chair (including a wheelchair)?: A Little Help needed standing up from a chair using your arms (e.g., wheelchair or bedside chair)?: A Little Help needed to walk in hospital room?: A Little Help needed climbing 3-5 steps with a railing? : A Lot 6 Click Score: 16    End of Session Equipment Utilized During Treatment: Gait  belt Activity Tolerance: Patient limited by pain Patient left: in bed;with call bell/phone within reach (in L sidelying) Nurse Communication: Mobility status PT Visit Diagnosis: Other abnormalities of gait and mobility (R26.89);Pain Pain - Right/Left: Right Pain - part of body:  (back)    Time: 9122-5834 PT Time Calculation (min) (ACUTE ONLY): 21 min   Charges:   PT Evaluation $PT Eval Low Complexity: 1 Low          Festus Barren PT, DPT  Acute Rehabilitation Services  Office [phone] 08/25/2021, 1:03 PM

## 2021-08-26 DIAGNOSIS — C3490 Malignant neoplasm of unspecified part of unspecified bronchus or lung: Secondary | ICD-10-CM

## 2021-08-26 DIAGNOSIS — I4891 Unspecified atrial fibrillation: Secondary | ICD-10-CM

## 2021-08-26 DIAGNOSIS — S22080D Wedge compression fracture of T11-T12 vertebra, subsequent encounter for fracture with routine healing: Secondary | ICD-10-CM | POA: Diagnosis not present

## 2021-08-26 DIAGNOSIS — I2510 Atherosclerotic heart disease of native coronary artery without angina pectoris: Secondary | ICD-10-CM | POA: Diagnosis not present

## 2021-08-26 DIAGNOSIS — M549 Dorsalgia, unspecified: Secondary | ICD-10-CM | POA: Diagnosis not present

## 2021-08-26 DIAGNOSIS — J189 Pneumonia, unspecified organism: Secondary | ICD-10-CM | POA: Diagnosis not present

## 2021-08-26 LAB — BASIC METABOLIC PANEL
Anion gap: 10 (ref 5–15)
BUN: 16 mg/dL (ref 6–20)
CO2: 31 mmol/L (ref 22–32)
Calcium: 8.9 mg/dL (ref 8.9–10.3)
Chloride: 95 mmol/L — ABNORMAL LOW (ref 98–111)
Creatinine, Ser: 0.9 mg/dL (ref 0.61–1.24)
GFR, Estimated: >60 mL/min (ref >60)
Glucose, Bld: 113 mg/dL — ABNORMAL HIGH (ref 70–99)
Potassium: 3.8 mmol/L (ref 3.5–5.1)
Sodium: 136 mmol/L (ref 135–145)

## 2021-08-26 LAB — CBC
HCT: 31.8 % — ABNORMAL LOW (ref 39.0–52.0)
Hemoglobin: 10.6 g/dL — ABNORMAL LOW (ref 13.0–17.0)
MCH: 28.5 pg (ref 26.0–34.0)
MCHC: 33.3 g/dL (ref 30.0–36.0)
MCV: 85.5 fL (ref 80.0–100.0)
Platelets: 179 10*3/uL (ref 150–400)
RBC: 3.72 MIL/uL — ABNORMAL LOW (ref 4.22–5.81)
RDW: 17.2 % — ABNORMAL HIGH (ref 11.5–15.5)
WBC: 5.9 10*3/uL (ref 4.0–10.5)
nRBC: 0 % (ref 0.0–0.2)

## 2021-08-26 LAB — MAGNESIUM: Magnesium: 2 mg/dL (ref 1.7–2.4)

## 2021-08-26 MED ORDER — METOPROLOL SUCCINATE ER 50 MG PO TB24
50.0000 mg | ORAL_TABLET | Freq: Every day | ORAL | Status: DC
  Administered 2021-08-27 – 2021-08-29 (×3): 50 mg via ORAL
  Filled 2021-08-26 (×3): qty 1

## 2021-08-26 MED ORDER — POLYETHYLENE GLYCOL 3350 17 G PO PACK
17.0000 g | PACK | Freq: Two times a day (BID) | ORAL | Status: AC
  Administered 2021-08-27 – 2021-08-28 (×3): 17 g via ORAL
  Filled 2021-08-26 (×2): qty 1

## 2021-08-26 MED ORDER — PREDNISONE 20 MG PO TABS
20.0000 mg | ORAL_TABLET | Freq: Every day | ORAL | Status: DC
  Administered 2021-08-27 – 2021-08-29 (×3): 20 mg via ORAL
  Filled 2021-08-26 (×3): qty 1

## 2021-08-26 MED ORDER — PREDNISONE 10 MG PO TABS
10.0000 mg | ORAL_TABLET | Freq: Every day | ORAL | Status: DC
Start: 2021-08-27 — End: 2021-08-26

## 2021-08-26 MED ORDER — HYDROMORPHONE HCL 1 MG/ML IJ SOLN
1.0000 mg | INTRAMUSCULAR | Status: DC | PRN
  Administered 2021-08-28: 1 mg via INTRAVENOUS
  Filled 2021-08-26: qty 1

## 2021-08-26 MED ORDER — MORPHINE SULFATE ER 30 MG PO TBCR
30.0000 mg | EXTENDED_RELEASE_TABLET | Freq: Two times a day (BID) | ORAL | Status: DC
  Administered 2021-08-26 – 2021-08-28 (×4): 30 mg via ORAL
  Filled 2021-08-26 (×4): qty 1

## 2021-08-26 MED ORDER — OXYCODONE HCL 5 MG PO TABS
10.0000 mg | ORAL_TABLET | ORAL | Status: DC | PRN
  Administered 2021-08-26 – 2021-08-29 (×16): 10 mg via ORAL
  Filled 2021-08-26 (×16): qty 2

## 2021-08-26 MED ORDER — POLYETHYLENE GLYCOL 3350 17 G PO PACK
17.0000 g | PACK | Freq: Every day | ORAL | Status: DC | PRN
  Filled 2021-08-26: qty 1

## 2021-08-26 NOTE — Progress Notes (Signed)
OT Cancellation Note  Patient Details Name: John [name] MRN: [id number] DOB: 1960/12/26   Cancelled Treatment:    Reason Eval/Treat Not Completed: Pain limiting ability to participate Patient reported increased pain today with having gotten to [facility] with nursing earlier this afternoon. Patient declined to attempt session on this date. OT to continue to follow and check back as schedule will allow.  Jackelyn Poling OTR/L, [place] Acute Rehabilitation Department Office# [phone] Pager# [phone]  08/26/2021, 2:01 PM

## 2021-08-26 NOTE — Progress Notes (Signed)
Patient ID: John [name], male   DOB: 06/25/1960, 61 y.o.   MRN: [id number] Per IR scheduler, still awaiting word from insurance regarding approval for KP on pt. If approved will tent be scheduled on 7/19.

## 2021-08-26 NOTE — Progress Notes (Signed)
[facility] PROGRESS NOTE    Progress Note  John [name]  XBJ:[id number] DOB: May 21, 1960 DOA: 08/23/2021 PCP: [name], MD     Brief Narrative:   John [name] is an 61 y.o. male past medical history of metastatic lung cancer, paroxysmal atrial fibrillation rheumatoid arthritis history of seizures comes in to the hospital for back pain, he relates that 2 weeks prior to admission he hit himself against the washer since then has been having back pain went into the ED on 08/20/2021 they found him to have a T12 compression fracture, neurosurgery recommended DLCO discharged home with follow-up as an outpatient with narcotics.  Has been having a hard time managing his ADLs, basically bedbound unable to get relief came back to the ED   Assessment/Plan:   Acute T12 compression fracture (HCC) Pain continues to be uncontrolled increase long-acting insulin. IR was consulted for kyphoplasty and awaiting insurance.   Eliquis was held. He was started on long-acting MS Contin. With improvement in his pain.  Paroxysmal atrial fibrillation: Eliquis held continue amiodarone started on metoprolol with improvement in his rate.  Stage 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 ([place])  Mixed hyperlipidemia Cont statin   DVT prophylaxis: Lovenox Family Communication:none Status is: Inpatient due to Acute intractable back pain    Code Status:     Code Status Orders  (From admission, onward)           Start     Ordered   08/23/21 2023  Full code  Continuous        08/23/21 2022           Code Status History     Date Active Date Inactive Code Status Order ID Comments User Context   08/02/2021 0930 08/07/2021 2105 Full Code [id number]  Vernelle Emerald, MD Inpatient   04/17/2021 2044 04/23/2021 1522 Full Code [id number]  Jonnie Finner, DO ED   04/14/2021 1947 04/15/2021 2256 Full Code [id number]  Orene Desanctis, DO ED   03/30/2021 1853 04/04/2021 0120 Full Code [id number]  Marcelyn Bruins, MD ED   12/22/2020 1254 01/01/2021 2352 Full Code [id number]  Reubin Milan, MD ED   10/06/2020 0110 10/14/2020 1949 Full Code [id number]  Toy Baker, MD Inpatient   05/12/2020 0916 05/20/2020 1946 Full Code [id number]  Chotiner, Yevonne Aline, MD Inpatient         IV Access:   Peripheral IV   Procedures and diagnostic studies:   No results found.   Medical Consultants:   None.   Subjective:    John [name] relates his pain is not controlled has not had a bowel movement in 5 days.  Objective:    Vitals:   08/25/21 1125 08/25/21 1214 08/25/21 2047 08/26/21 0637  BP: 105/67 106/74 115/72 126/72  Pulse:   79 91  Resp: 18 18 18 20   Temp: 97.8 F (36.6 C)  98 F (36.7 C) 97.9 F (36.6 C)  TempSrc: Oral  Oral Oral  SpO2: 90% 90% 92% 95%  Weight:      Height:       SpO2: 95 % O2 Flow Rate (L/min): (S) 4 L/min   Intake/Output Summary (Last 24 hours) at 08/26/2021 1100 Last data filed at 08/26/2021 0904 Gross per 24 hour  Intake 780 ml  Output 1950 ml  Net -1170 ml   Filed Weights   08/23/21 1520 08/23/21 2028  Weight: 104.3 kg 101 kg    Exam: General exam: In no acute distress.  Respiratory system: Good air movement and clear to auscultation. Cardiovascular system: S1 & S2 heard, RRR. No JVD. Gastrointestinal system: Abdomen is nondistended, soft and nontender.  Extremities: No pedal edema. Skin: No rashes, lesions or ulcers Psychiatry: Judgement and insight appear normal. Mood & affect appropriate.    Data Reviewed:    Labs: Basic Metabolic Panel: Recent Labs  Lab 08/20/21 1139 08/23/21 1547 08/23/21 1920 08/24/21 0422 08/26/21 0440  NA 139 135  --  138 136  K 4.1 4.4  --  4.0 3.8  CL 103 98  --  99 95*  CO2 28 29  --  31 31  GLUCOSE 146* 164*  --  107* 113*  BUN 13 14  --  15 16  CREATININE 0.87 0.89  --  0.95 0.90  CALCIUM 9.0 8.5*  --  9.0 8.9  MG  --   --  2.0  --  2.0  PHOS  --   --  4.2  --   --     GFR Estimated Creatinine Clearance: 105.7 mL/min (by C-G formula based on SCr of 0.9 mg/dL). Liver Function Tests: Recent Labs  Lab 08/23/21 1547 08/24/21 0422  AST 13* 15  ALT 19 18  ALKPHOS 69 70  BILITOT 0.8 0.8  PROT 6.4* 6.2*  ALBUMIN 3.0* 3.1*   No results for input(s): "LIPASE", "AMYLASE" in the last 168 hours. No results for input(s): "AMMONIA" in the last 168 hours. Coagulation profile Recent Labs  Lab 08/20/21 1139  INR 1.2   COVID-19 Labs  Recent Labs    08/23/21 1920  FERRITIN 446*    Lab Results  Component Value Date   SARSCOV2NAA NEGATIVE 07/20/2021   SARSCOV2NAA NEGATIVE 04/17/2021   SARSCOV2NAA NEGATIVE 04/14/2021   [place] NEGATIVE 03/30/2021    CBC: Recent Labs  Lab 08/20/21 1139 08/23/21 1547 08/24/21 0422 08/26/21 0440  WBC 8.4 7.2 6.7 5.9  NEUTROABS 7.5 6.5 5.6  --   HGB 12.0* 11.7* 11.1* 10.6*  HCT 37.3* 35.3* 34.0* 31.8*  MCV 87.6 86.5 87.6 85.5  PLT 234 191 183 179   Cardiac Enzymes: Recent Labs  Lab 08/23/21 1920  CKTOTAL 13*   BNP (last 3 results) No results for input(s): "PROBNP" in the last 8760 hours. CBG: No results for input(s): "GLUCAP" in the last 168 hours. D-Dimer: No results for input(s): "DDIMER" in the last 72 hours. Hgb A1c: No results for input(s): "HGBA1C" in the last 72 hours. Lipid Profile: No results for input(s): "CHOL", "HDL", "LDLCALC", "TRIG", "CHOLHDL", "LDLDIRECT" in the last 72 hours. Thyroid function studies: No results for input(s): "TSH", "T4TOTAL", "T3FREE", "THYROIDAB" in the last 72 hours.  Invalid input(s): "FREET3" Anemia work up: Recent Labs    08/23/21 1920  VITAMINB12 121*  FOLATE 11.2  FERRITIN 446*  TIBC 200*  IRON 32*  RETICCTPCT 1.9   Sepsis Labs: Recent Labs  Lab 08/20/21 1139 08/23/21 1547 08/24/21 0422 08/26/21 0440  WBC 8.4 7.2 6.7 5.9   Microbiology No results found for this or any previous visit (from the past 240 hour(s)).   Medications:     amiodarone  200 mg Oral Daily   diclofenac Sodium  4 g Topical QID   feeding supplement  1 Container Oral Q24H   feeding supplement  237 mL Oral Q24H   lidocaine  1 patch Transdermal Q24H   metoprolol succinate  25 mg Oral Daily   morphine  15 mg Oral Q12H   multivitamin with minerals  1 tablet Oral Daily  PARoxetine  10 mg Oral QHS   polyethylene glycol  17 g Oral Daily   predniSONE  20 mg Oral Q breakfast   senna-docusate  2 tablet Oral BID   Vitamin D (Ergocalciferol)  50,000 Units Oral Q7 days   Continuous Infusions:  methocarbamol (ROBAXIN) IV        LOS: 2 days   Charlynne Cousins  [facility]  08/26/2021, 11:00 AM

## 2021-08-26 NOTE — Progress Notes (Signed)
I appreciate Interventional Radiology seen him.  They will do the kyphoplasty but of course, insurance gets in the way.  I need to get insurance approval.  Hopefully they will get this.  I am sure that God will provide and he will be able to have the procedure.  Kyphoplasty will make him feel a whole lot better.  He still having issues with pain.  It is hard for him to get around.  His labs look okay.  His white cell count is 5.9.  Hemoglobin 10.6.  Platelet count 179,000.  His BUN is 16 creatinine 0.9.  Calcium 8.9.  His appetite is doing well.  He is having no problems with nausea or vomiting.  He has not yet had a bowel movement.  He is not that uncomfortable as of yet.  I think he is taking some stool softener.  His vital signs are stable.  The atrial fibrillation is under good control.  His temperature is 97.9.  Pulse 91.  Blood pressure 126/72.  His lungs are clear bilaterally.  Cardiac exam shows an irregular rate and rhythm consistent with atrial fibrillation.  The rate is well controlled.  Abdomen is soft.  Bowel sounds are present.  Again, we are awaiting the insurance approval for the kyphoplasty.  Otherwise, really not much else is happening.  I just do not see him going home and being miserable at home.  Hopefully, the kyphoplasty can be approved and done this week.  He is on MS Contin and oxycodone for pain.  I know he is getting incredible care from the staff up on [place], MD  Hebrews 12:12

## 2021-08-26 NOTE — TOC Initial Note (Signed)
Transition of Care [facility]) - Initial/Assessment Note    Patient Details  Name: John [name] MRN: [id number] Date of Birth: 01-31-1961  Transition of Care [facility]) CM/SW Contact:    Leeroy Cha, RN Phone Number: 08/26/2021, 10:37 AM  Clinical Narrative:                  Transition of Care [facility]) Screening Note   Patient Details  Name: John [name] Date of Birth: [birth year]   Transition of Care [facility]) CM/SW Contact:    Leeroy Cha, RN Phone Number: 08/26/2021, 10:37 AM Pt with back pain will need snf placement.      Expected Discharge Plan: Home/Self Care Barriers to Discharge: Continued Medical Work up   Patient Goals and CMS Choice Patient states their goals for this hospitalization and ongoing recovery are:: to go home but I need to do something about the back pain first CMS Medicare.gov Compare Post Acute Care list provided to:: Patient    Expected Discharge Plan and Services Expected Discharge Plan: Home/Self Care   Discharge Planning Services: CM Consult   Living arrangements for the past 2 months: Single Family Home                                      Prior Living Arrangements/Services Living arrangements for the past 2 months: Single Family Home Lives with:: Spouse Patient language and need for interpreter reviewed:: Yes Do you feel safe going back to the place where you live?: Yes            Criminal Activity/Legal Involvement Pertinent to Current Situation/Hospitalization: No - Comment as needed  Activities of Daily Living Home Assistive Devices/Equipment: Oxygen ADL Screening (condition at time of admission) Patient's cognitive ability adequate to safely complete daily activities?: Yes Is the patient deaf or have difficulty hearing?: No Does the patient have difficulty seeing, even when wearing glasses/contacts?: No Does the patient have difficulty concentrating, remembering, or making decisions?: No Patient able to express  need for assistance with ADLs?: No Does the patient have difficulty dressing or bathing?: Yes Independently performs ADLs?: Yes (appropriate for developmental age) Does the patient have difficulty walking or climbing stairs?: No Weakness of Legs: None Weakness of Arms/Hands: None  Permission Sought/Granted                  Emotional Assessment Appearance:: Appears stated age     Orientation: : Oriented to Self, Oriented to Place, Oriented to  Time, Oriented to Situation Alcohol / Substance Use: Not Applicable Psych Involvement: No (comment)  Admission diagnosis:  T12 compression fracture ([place]) [S22.080A] Intractable back pain [M54.9] Patient Active Problem List   Diagnosis Date Noted   Intractable back pain 08/24/2021   T12 compression fracture ([place]) 08/23/2021   Paroxysmal atrial fibrillation with RVR ([place]) 08/03/2021   Mixed hyperlipidemia 08/02/2021   Coronary artery disease involving native coronary artery of native heart without angina pectoris 08/02/2021   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[place]) 07/29/2021   Obesity 07/27/2021   Normocytic anemia 04/18/2021   Pneumothorax on right 04/14/2021   Chronic respiratory failure with hypoxia ([place]) 04/14/2021   Drug-induced pneumonitis 12/23/2020   Leukocytopenia 12/22/2020   Shortness of breath 12/18/2020   Abnormal chest x-ray 12/18/2020   Secondary hypercoagulable state ([place]) 10/28/2020   Malignant neoplasm of lung ([place])    Paroxysmal A-fib ([place]) 10/06/2020   RA (rheumatoid arthritis) (  [place]) 10/06/2020   Persistent atrial fibrillation ([place]) 08/25/2020   Drug-induced neutropenia ([place]) 08/11/2020   Encounter for antineoplastic immunotherapy 06/09/2020   Hypokalemia    Orthostatic hypotension    C. difficile colitis    Bacteremia due to Pseudomonas    Diarrhea    Hyponatremia 05/12/2020   Neutropenic fever ([place]) 05/12/2020   Occlusion of right pulmonary artery ([place]) 05/12/2020   Neutropenia ([place])  05/12/2020   Thrombus    Stage 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 ([place]) 04/10/2020   Encounter for antineoplastic chemotherapy 04/10/2020   Mass of right lung 03/24/2020   COPD with asthma ([place]) 03/24/2020   Pseudophakia of right eye 11/19/2014   Chronic anxiety 11/08/2014   H/O rheumatoid arthritis 11/08/2014   Petit mal seizure status ([place]) 11/08/2014   Age-related nuclear cataract of left eye 08/12/2014   PCP:  [name], MD Pharmacy:   [facility] [place], [address] [place] [address] Phone: [phone] Fax: [phone]  [place] [address] Phone: [phone] Fax: [phone]     Social Determinants of Health (SDOH) Interventions    Readmission Risk Interventions    08/06/2021    2:26 PM  Readmission Risk Prevention Plan  Transportation Screening Complete  HRI or [place] Complete  SW Recovery Care/Counseling Consult Complete  [place] Not Applicable

## 2021-08-26 NOTE — Plan of Care (Signed)
  Problem: Coping: Goal: Level of anxiety will decrease Outcome: Progressing   Problem: Safety: Goal: Ability to remain free from injury will improve Outcome: Progressing   Problem: Pain Managment: Goal: General experience of comfort will improve Outcome: Not Progressing   

## 2021-08-27 DIAGNOSIS — S22080D Wedge compression fracture of T11-T12 vertebra, subsequent encounter for fracture with routine healing: Secondary | ICD-10-CM | POA: Diagnosis not present

## 2021-08-27 DIAGNOSIS — M549 Dorsalgia, unspecified: Secondary | ICD-10-CM | POA: Diagnosis not present

## 2021-08-27 DIAGNOSIS — I4891 Unspecified atrial fibrillation: Secondary | ICD-10-CM | POA: Diagnosis not present

## 2021-08-27 DIAGNOSIS — I2510 Atherosclerotic heart disease of native coronary artery without angina pectoris: Secondary | ICD-10-CM | POA: Diagnosis not present

## 2021-08-27 DIAGNOSIS — J189 Pneumonia, unspecified organism: Secondary | ICD-10-CM | POA: Diagnosis not present

## 2021-08-27 MED ORDER — KETOROLAC TROMETHAMINE 30 MG/ML IJ SOLN
30.0000 mg | Freq: Three times a day (TID) | INTRAMUSCULAR | Status: AC
  Administered 2021-08-27 – 2021-08-28 (×5): 30 mg via INTRAVENOUS
  Filled 2021-08-27 (×5): qty 1

## 2021-08-27 NOTE — Progress Notes (Signed)
Physical Therapy Treatment Patient Details Name: John [name] MRN: [id number] DOB: Jan 20, 1961 Today's Date: 08/27/2021   History of Present Illness Patient is a 61 year old male hwo presented to the hosptial on 7/9 with back pain. of note patient had recently presented to the ED on 7/6 with findings of new T12 fx with conservative recommendations. patient was unable to control pain and move around at home prompting trip back to hospital. PMH: a fib, GERD, metastatic lung cancer, obesity, orthostatic hypotension.    PT Comments    Pt ambulated 20' + 12' with RW with seated rest break, distance limited by fatigue, SpO2 80% on room air walking, 95% on 4L O2 at rest. Pt performed seated and standing BLE strengthening exercises.    Recommendations for follow up therapy are one component of a multi-disciplinary discharge planning process, led by the attending physician.  Recommendations may be updated based on patient status, additional functional criteria and insurance authorization.  Follow Up Recommendations  Home health PT     Assistance Recommended at Discharge    Patient can return home with the following A little help with walking and/or transfers;A lot of help with bathing/dressing/bathroom;Assist for transportation;Help with stairs or ramp for entrance;Assistance with cooking/housework   Equipment Recommendations  None recommended by PT    Recommendations for Other Services       Precautions / Restrictions Precautions Precautions: Back Precaution Comments: Pt reports he got TLSO brace when presented to ED on 7/6 but does not recall receiving specific wear schedule/instructions (OOB vs. at all times, etc). Restrictions Weight Bearing Restrictions: No     Mobility  Bed Mobility   Bed Mobility: Rolling, Sidelying to Sit, Sit to Sidelying Rolling: Modified independent (Device/Increase time) Sidelying to sit: Supervision, HOB elevated     Sit to sidelying: Min assist, HOB  elevated General bed mobility comments: good technique for log roll, min A for LEs into bed    Transfers Overall transfer level: Needs assistance Equipment used: Rolling walker (2 wheels) Transfers: Sit to/from Stand Sit to Stand: Supervision           General transfer comment: sit to stand x 3 trials with RW, increased time 2* pain. Pt performed marching in standing x 10 B AROM and minisquats x 10 with RW.    Ambulation/Gait Ambulation/Gait assistance: Supervision Gait Distance (Feet): 20 Feet Assistive device: Rolling walker (2 wheels) Gait Pattern/deviations: Step-through pattern, Decreased stride length Gait velocity: WFL     General Gait Details: 42' + 12' with seated rest break, distance limited by dyspnea, SpO2 80% on RA with walking, 95% on 4L O2 at rest   Stairs             Wheelchair Mobility    Modified Rankin (Stroke Patients Only)       Balance Overall balance assessment: Needs assistance Sitting-balance support: Bilateral upper extremity supported Sitting balance-Leahy Scale: Fair     Standing balance support: Bilateral upper extremity supported, During functional activity, Reliant on assistive device for balance Standing balance-Leahy Scale: Poor Standing balance comment: reliant on external support of RW                            Cognition Arousal/Alertness: Awake/alert Behavior During Therapy: WFL for tasks assessed/performed Overall Cognitive Status: Within Functional Limits for tasks assessed  Exercises General Exercises - Upper Extremity Shoulder Flexion: AROM, Both, 5 reps, Seated General Exercises - Lower Extremity Long Arc Quad: AROM, Both, 10 reps, Seated Hip Flexion/Marching: AROM, Both, 10 reps, Standing Mini-Sqauts: AROM, Both, Standing    General Comments        Pertinent Vitals/Pain Pain Assessment Pain Score: 6  Pain Location: "adrenal gland"   and mid-low back with movement Pain Descriptors / Indicators: Discomfort, Spasm, Sore Pain Intervention(s): Limited activity within patient's tolerance, Monitored during session, Premedicated before session, Repositioned    Home Living                          Prior Function            PT Goals (current goals can now be found in the care plan section) Acute Rehab PT Goals Patient Stated Goal: decrease pain levels and go back home PT Goal Formulation: With patient Time For Goal Achievement: 09/08/21 Potential to Achieve Goals: Good Progress towards PT goals: Progressing toward goals    Frequency    Min 3X/week      PT Plan Current plan remains appropriate    Co-evaluation              AM-PAC PT "6 Clicks" Mobility   Outcome Measure  Help needed turning from your back to your side while in a flat bed without using bedrails?: A Little Help needed moving from lying on your back to sitting on the side of a flat bed without using bedrails?: A Little Help needed moving to and from a bed to a chair (including a wheelchair)?: A Little Help needed standing up from a chair using your arms (e.g., wheelchair or bedside chair)?: None Help needed to walk in hospital room?: None Help needed climbing 3-5 steps with a railing? : A Little 6 Click Score: 20    End of Session Equipment Utilized During Treatment: Gait belt;Oxygen Activity Tolerance: Patient limited by pain Patient left: in bed;with call bell/phone within reach;with family/visitor present Nurse Communication: Mobility status PT Visit Diagnosis: Other abnormalities of gait and mobility (R26.89);Pain     Time: 0931-1216 PT Time Calculation (min) (ACUTE ONLY): 23 min  Charges:  $Gait Training: 8-22 mins $Therapeutic Exercise: 8-22 mins                     John [name] PT 08/27/2021  Acute Rehabilitation Services  Office [phone]

## 2021-08-27 NOTE — Progress Notes (Signed)
[facility] PROGRESS NOTE    Progress Note  John [name]  DXA:[id number] DOB: May 22, 1960 DOA: 08/23/2021 PCP: [name], MD     Brief Narrative:   John [name] is an 61 y.o. male past medical history of metastatic lung cancer, paroxysmal atrial fibrillation rheumatoid arthritis history of seizures comes in to the hospital for back pain, he relates that 2 weeks prior to admission he hit himself against the washer since then has been having back pain went into the ED on 08/20/2021 they found him to have a T12 compression fracture, neurosurgery recommended DLCO discharged home with follow-up as an outpatient with narcotics.  Has been having a hard time managing his ADLs, basically bedbound unable to get relief came back to the ED   Assessment/Plan:   Acute T12 compression fracture (HCC) Pain is improved today he continues to use regularly the short acting. Oncology started ketorolac I agree. Continue long-acting insulin at current dose and short acting oxy. IR was consulted for kyphoplasty and awaiting insurance.   We will discuss with oncology to resume Eliquis. Continue MiraLAX p.o. twice daily has not had a bowel movement.  Paroxysmal atrial fibrillation: Eliquis held continue amiodarone started on metoprolol with improvement in his rate.  Stage 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 ([place])  Mixed hyperlipidemia Cont statin   DVT prophylaxis: Lovenox Family Communication:none Status is: Inpatient due to Acute intractable back pain    Code Status:     Code Status Orders  (From admission, onward)           Start     Ordered   08/23/21 2023  Full code  Continuous        08/23/21 2022           Code Status History     Date Active Date Inactive Code Status Order ID Comments User Context   08/02/2021 0930 08/07/2021 2105 Full Code [id number]  Vernelle Emerald, MD Inpatient   04/17/2021 2044 04/23/2021 1522 Full Code [id number]  Jonnie Finner, DO ED    04/14/2021 1947 04/15/2021 2256 Full Code [id number]  Orene Desanctis, DO ED   03/30/2021 1853 04/04/2021 0120 Full Code [id number]  Marcelyn Bruins, MD ED   12/22/2020 1254 01/01/2021 2352 Full Code [id number]  Reubin Milan, MD ED   10/06/2020 0110 10/14/2020 1949 Full Code [id number]  Toy Baker, MD Inpatient   05/12/2020 0916 05/20/2020 1946 Full Code [id number]  Chotiner, Yevonne Aline, MD Inpatient         IV Access:   Peripheral IV   Procedures and diagnostic studies:   No results found.   Medical Consultants:   None.   Subjective:    John [name] has not had a bowel movement related his pain is controlled.  Objective:    Vitals:   08/26/21 0637 08/26/21 1214 08/26/21 2024 08/27/21 0449  BP: 126/72 110/84 103/70 125/77  Pulse: 91 97 79 81  Resp: 20 20 20 18   Temp: 97.9 F (36.6 C) 98.9 F (37.2 C) 97.9 F (36.6 C) 98.2 F (36.8 C)  TempSrc: Oral Oral Oral Oral  SpO2: 95% 90% 95% 93%  Weight:      Height:       SpO2: 93 % O2 Flow Rate (L/min): 4 L/min   Intake/Output Summary (Last 24 hours) at 08/27/2021 0830 Last data filed at 08/27/2021 0422 Gross per 24 hour  Intake 720 ml  Output 1550 ml  Net -830 ml    [employer]  08/23/21 1520 08/23/21 2028  Weight: 104.3 kg 101 kg    Exam: General exam: In no acute distress. Respiratory system: Good air movement and clear to auscultation. Cardiovascular system: S1 & S2 heard, RRR. No JVD. Gastrointestinal system: Abdomen is nondistended, soft and nontender.  Extremities: No pedal edema. Skin: No rashes, lesions or ulcers Psychiatry: Judgement and insight appear normal. Mood & affect appropriate.   Data Reviewed:    Labs: Basic Metabolic Panel: Recent Labs  Lab 08/20/21 1139 08/23/21 1547 08/23/21 1920 08/24/21 0422 08/26/21 0440  NA 139 135  --  138 136  K 4.1 4.4  --  4.0 3.8  CL 103 98  --  99 95*  CO2 28 29  --  31 31  GLUCOSE 146* 164*  --  107* 113*  BUN 13 14  --  15 16   CREATININE 0.87 0.89  --  0.95 0.90  CALCIUM 9.0 8.5*  --  9.0 8.9  MG  --   --  2.0  --  2.0  PHOS  --   --  4.2  --   --     GFR Estimated Creatinine Clearance: 105.7 mL/min (by C-G formula based on SCr of 0.9 mg/dL). Liver Function Tests: Recent Labs  Lab 08/23/21 1547 08/24/21 0422  AST 13* 15  ALT 19 18  ALKPHOS 69 70  BILITOT 0.8 0.8  PROT 6.4* 6.2*  ALBUMIN 3.0* 3.1*    No results for input(s): "LIPASE", "AMYLASE" in the last 168 hours. No results for input(s): "AMMONIA" in the last 168 hours. Coagulation profile Recent Labs  Lab 08/20/21 1139  INR 1.2    COVID-19 Labs  No results for input(s): "DDIMER", "FERRITIN", "LDH", "CRP" in the last 72 hours.   Lab Results  Component Value Date   SARSCOV2NAA NEGATIVE 07/20/2021   SARSCOV2NAA NEGATIVE 04/17/2021   [place] NEGATIVE 04/14/2021   [place] NEGATIVE 03/30/2021    CBC: Recent Labs  Lab 08/20/21 1139 08/23/21 1547 08/24/21 0422 08/26/21 0440  WBC 8.4 7.2 6.7 5.9  NEUTROABS 7.5 6.5 5.6  --   HGB 12.0* 11.7* 11.1* 10.6*  HCT 37.3* 35.3* 34.0* 31.8*  MCV 87.6 86.5 87.6 85.5  PLT 234 191 183 179    Cardiac Enzymes: Recent Labs  Lab 08/23/21 1920  CKTOTAL 13*    BNP (last 3 results) No results for input(s): "PROBNP" in the last 8760 hours. CBG: No results for input(s): "GLUCAP" in the last 168 hours. D-Dimer: No results for input(s): "DDIMER" in the last 72 hours. Hgb A1c: No results for input(s): "HGBA1C" in the last 72 hours. Lipid Profile: No results for input(s): "CHOL", "HDL", "LDLCALC", "TRIG", "CHOLHDL", "LDLDIRECT" in the last 72 hours. Thyroid function studies: No results for input(s): "TSH", "T4TOTAL", "T3FREE", "THYROIDAB" in the last 72 hours.  Invalid input(s): "FREET3" Anemia work up: No results for input(s): "VITAMINB12", "FOLATE", "FERRITIN", "TIBC", "IRON", "RETICCTPCT" in the last 72 hours.  Sepsis Labs: Recent Labs  Lab 08/20/21 1139 08/23/21 1547  08/24/21 0422 08/26/21 0440  WBC 8.4 7.2 6.7 5.9    Microbiology No results found for this or any previous visit (from the past 240 hour(s)).   Medications:    amiodarone  200 mg Oral Daily   diclofenac Sodium  4 g Topical QID   feeding supplement  1 Container Oral Q24H   feeding supplement  237 mL Oral Q24H   ketorolac  30 mg Intravenous Q8H   lidocaine  1 patch Transdermal Q24H   metoprolol  succinate  50 mg Oral Daily   morphine  30 mg Oral Q12H   multivitamin with minerals  1 tablet Oral Daily   PARoxetine  10 mg Oral QHS   polyethylene glycol  17 g Oral BID   predniSONE  20 mg Oral Q breakfast   senna-docusate  2 tablet Oral BID   Vitamin D (Ergocalciferol)  50,000 Units Oral Q7 days   Continuous Infusions:  methocarbamol (ROBAXIN) IV        LOS: 3 days   Charlynne Cousins  [facility]  08/27/2021, 8:30 AM

## 2021-08-27 NOTE — Progress Notes (Signed)
Unfortunately, it does not look like the kyphoplasty can be done until next week, at the earliest.  I am not sure why insurance is having a hard time with this.  There is been some adjustments with his medications.  He is on MS Contin 30 mg twice a day.  He gets oxycodone every 4 hours as needed.  He has gotten Zometa already.  I think that if he cannot have the kyphoplasty's next week, he is probably can have to go home.  He says he is feeling a little bit better.  He still does not want to restart radiation therapy.  He has had no problems with the atrial fibrillation.  He still has it but it seems to be fairly well controlled.  There is no cough or shortness of breath.  He still not had a bowel movement.  He feels a lot of rumbling in his abdomen so hopefully will have 1 today.  I really would like to not seem to get constipated.  His vital signs are temperature of 98.2.  Pulse 81.  Blood pressure 125/77.  His lungs are clear bilaterally.  Cardiac exam is irregular rate and rhythm consistent with atrial fibrillation.  The rate is well controlled.  Abdomen is soft.  Bowel sounds are present.  There is no guarding or rebound tenderness.  Extremity shows no clubbing, cyanosis or edema.  Neurological exam is nonfocal.  Again, it does not sound like the kyphoplasty will be done until next week, if the insurance will approve it.  I just cannot imagine that he would be her that whole time waiting.  He does go home, it sounds like he is on the some pain regimen.  I think it is a great idea to have the long-acting and short acting pain medication for him.  I will give him some Toradol today.  This has good anti-inflammatory properties.  This may help a little bit.  I do appreciate all the great care he is getting from the staff up on [place], MD  Hebrews 12:12

## 2021-08-28 ENCOUNTER — Encounter: Payer: Self-pay | Admitting: Internal Medicine

## 2021-08-28 ENCOUNTER — Encounter: Payer: Self-pay | Admitting: Hematology & Oncology

## 2021-08-28 ENCOUNTER — Ambulatory Visit: Payer: 59 | Admitting: Nurse Practitioner

## 2021-08-28 ENCOUNTER — Ambulatory Visit: Payer: 59 | Admitting: Emergency Medicine

## 2021-08-28 DIAGNOSIS — I4891 Unspecified atrial fibrillation: Secondary | ICD-10-CM | POA: Diagnosis not present

## 2021-08-28 DIAGNOSIS — M549 Dorsalgia, unspecified: Secondary | ICD-10-CM | POA: Diagnosis not present

## 2021-08-28 DIAGNOSIS — S22080D Wedge compression fracture of T11-T12 vertebra, subsequent encounter for fracture with routine healing: Secondary | ICD-10-CM | POA: Diagnosis not present

## 2021-08-28 DIAGNOSIS — I2510 Atherosclerotic heart disease of native coronary artery without angina pectoris: Secondary | ICD-10-CM | POA: Diagnosis not present

## 2021-08-28 DIAGNOSIS — J189 Pneumonia, unspecified organism: Secondary | ICD-10-CM | POA: Diagnosis not present

## 2021-08-28 LAB — IRON AND TIBC
Iron: 189 ug/dL — ABNORMAL HIGH (ref 45–182)
Saturation Ratios: 89 % — ABNORMAL HIGH (ref 17.9–39.5)
TIBC: 213 ug/dL — ABNORMAL LOW (ref 250–450)
UIBC: 24 ug/dL

## 2021-08-28 MED ORDER — MORPHINE SULFATE ER 30 MG PO TBCR
30.0000 mg | EXTENDED_RELEASE_TABLET | Freq: Once | ORAL | Status: AC
  Administered 2021-08-28: 30 mg via ORAL
  Filled 2021-08-28: qty 1

## 2021-08-28 MED ORDER — APIXABAN 5 MG PO TABS
5.0000 mg | ORAL_TABLET | Freq: Two times a day (BID) | ORAL | Status: DC
  Administered 2021-08-28 – 2021-08-29 (×3): 5 mg via ORAL
  Filled 2021-08-28 (×3): qty 1

## 2021-08-28 MED ORDER — MORPHINE SULFATE ER 30 MG PO TBCR
60.0000 mg | EXTENDED_RELEASE_TABLET | Freq: Two times a day (BID) | ORAL | Status: DC
  Administered 2021-08-28 – 2021-08-29 (×2): 60 mg via ORAL
  Filled 2021-08-28 (×2): qty 2

## 2021-08-28 NOTE — Progress Notes (Signed)
SATURATION QUALIFICATIONS: (This note is used to comply with regulatory documentation for home oxygen)    Patient Saturations on 4 L [place] while at rest = 95%%  Patient Saturations on 4 Liters of oxygen while Ambulating = 78%

## 2021-08-28 NOTE — Progress Notes (Signed)
Mobility Specialist - Progress Note    08/28/21 1435  Mobility  Activity Ambulated with assistance in room  Level of Assistance Contact guard assist, steadying assist  Assistive Device Front wheel walker  Distance Ambulated (ft) 20 ft  Activity Response Tolerated well  $Mobility charge 1 Mobility   Pre-mobility: 91% SpO2 During mobility: 83% SpO2 Post-mobility: 85% SPO2  Pt agreeable to ambulate this afternoon.O2 sats recorded above. Stayed on 4L of O2 during session, c/o of dizziness, and back pain. Pt ambulated a total of 20 ft in room with 1 seated rest break in between. Desated at 85% at EOS, but recovered back to 90s after 1 minute of rest. Pt left in bed with all necessities in reach.   [place] Specialist Acute Rehabilitation Services Phone: [phone] 08/28/21, 2:41 PM

## 2021-08-28 NOTE — Progress Notes (Signed)
He is feeling better.  Had a bowel movement last night.  He is doing well with the pain regimen that he is on.  Again I am not sure if or when the kyphoplasty is going to be done.  I would think that if he would like to go home when he thinks he can manage being at home, this would be okay from my point of view.  He has had no problems with his atrial fibrillation.  The rate seems to be under pretty good control.  He is just happy that he is going to the bathroom.  This is really eased up his mind.  His vital signs show temperature of 98.3.  Pulse 86.  Blood pressure 136/83.  His lungs sound clear bilaterally.  Cardiac exam is irregular rate and rhythm.  This is consistent with atrial fibrillation.  He has no murmurs.  Abdomen is soft.  Bowel sounds are present.  He has no fluid wave.  There is no guarding or rebound tenderness.  Strengthening shows some maybe trace edema.  Neurological exam is nonfocal.  I am just happy that he is doing better now that he is going to the bathroom.  His oral pain regimen seems to be doing pretty good form.  Again I do not know if or when kyphoplasty is going to be done.  For right now, we will just follow along and help out.  Again the problem is is steroid induced compression fracture in the spine.  His last prealbumin 4 days ago was 17.  This is much better than I had thought it was going to be.  I know that his iron saturation was pretty low 7 days ago.  It was only 16%.  This will have to be watched.  He may need a dose of IV iron.  His vitamin D level is incredibly low.  He is on supplemental vitamin D.   [name], MD  Romans 1:16

## 2021-08-28 NOTE — Progress Notes (Signed)
[facility] PROGRESS NOTE    Progress Note  Ashish Rossetti  OXB:[id number] DOB: 04/17/1960 DOA: 08/23/2021 PCP: [name], MD     Brief Narrative:   John [name] is an 61 y.o. male past medical history of metastatic lung cancer, paroxysmal atrial fibrillation rheumatoid arthritis history of seizures comes in to the hospital for back pain, he relates that 2 weeks prior to admission he hit himself against the washer since then has been having back pain went into the ED on 08/20/2021 they found him to have a T12 compression fracture, neurosurgery recommended DLCO discharged home with follow-up as an outpatient with narcotics.  Has been having a hard time managing his ADLs, basically bedbound unable to get relief came back to the ED   Assessment/Plan:   Acute T12 compression fracture ([place]) He relates the pain continues to improve. But he is using the same amount of short acting as long-acting. We will go ahead and increase the long-acting. IR was consulted for kyphoplasty and awaiting insurance.  He will have to go home and have kyphoplasty procedure as an outpatient. Resume Eliquis.  Continue MiraLAX p.o. twice daily.  Paroxysmal atrial fibrillation: Rate control metoprolol, resume Eliquis  Stage 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 ([place])  Mixed hyperlipidemia Cont statin   DVT prophylaxis: Lovenox Family Communication:none Status is: Inpatient due to Acute intractable back pain    Code Status:     Code Status Orders  (From admission, onward)           Start     Ordered   08/23/21 2023  Full code  Continuous        08/23/21 2022           Code Status History     Date Active Date Inactive Code Status Order ID Comments User Context   08/02/2021 0930 08/07/2021 2105 Full Code [id number]  Vernelle Emerald, MD Inpatient   04/17/2021 2044 04/23/2021 1522 Full Code [id number]  Jonnie Finner, DO ED   04/14/2021 1947 04/15/2021 2256 Full Code [id number]  Orene Desanctis, DO ED   03/30/2021 1853 04/04/2021 0120 Full Code [id number]  Marcelyn Bruins, MD ED   12/22/2020 1254 01/01/2021 2352 Full Code [id number]  Reubin Milan, MD ED   10/06/2020 0110 10/14/2020 1949 Full Code [id number]  Toy Baker, MD Inpatient   05/12/2020 0916 05/20/2020 1946 Full Code [id number]  Chotiner, Yevonne Aline, MD Inpatient         IV Access:   Peripheral IV   Procedures and diagnostic studies:   No results found.   Medical Consultants:   None.   Subjective:    John [name] had a bowel movement pain is improved but not able to perform his ADLs without any pain.  Objective:    Vitals:   08/27/21 1337 08/27/21 1945 08/28/21 0539 08/28/21 0819  BP: 106/74 112/77 136/83   Pulse: 85 73 86   Resp: 18 18 18    Temp: 98.7 F (37.1 C) 97.9 F (36.6 C) 98.3 F (36.8 C)   TempSrc: Oral Oral Oral   SpO2: 96% 95% 95% 94%  Weight:      Height:       SpO2: 94 % O2 Flow Rate (L/min): 4.5 L/min   Intake/Output Summary (Last 24 hours) at 08/28/2021 0850 Last data filed at 08/28/2021 0834 Gross per 24 hour  Intake 400 ml  Output 825 ml  Net -425 ml    Filed Weights   08/23/21  1520 08/23/21 2028  Weight: 104.3 kg 101 kg    Exam: General exam: In no acute distress. Respiratory system: Good air movement and clear to auscultation. Cardiovascular system: S1 & S2 heard, RRR. No JVD. Gastrointestinal system: Abdomen is nondistended, soft and nontender.  Extremities: No pedal edema. Skin: No rashes, lesions or ulcers Psychiatry: Judgement and insight appear normal. Mood & affect appropriate.   Data Reviewed:    Labs: Basic Metabolic Panel: Recent Labs  Lab 08/23/21 1547 08/23/21 1920 08/24/21 0422 08/26/21 0440  NA 135  --  138 136  K 4.4  --  4.0 3.8  CL 98  --  99 95*  CO2 29  --  31 31  GLUCOSE 164*  --  107* 113*  BUN 14  --  15 16  CREATININE 0.89  --  0.95 0.90  CALCIUM 8.5*  --  9.0 8.9  MG  --  2.0  --  2.0  PHOS  --   4.2  --   --     GFR Estimated Creatinine Clearance: 105.7 mL/min (by C-G formula based on SCr of 0.9 mg/dL). Liver Function Tests: Recent Labs  Lab 08/23/21 1547 08/24/21 0422  AST 13* 15  ALT 19 18  ALKPHOS 69 70  BILITOT 0.8 0.8  PROT 6.4* 6.2*  ALBUMIN 3.0* 3.1*    No results for input(s): "LIPASE", "AMYLASE" in the last 168 hours. No results for input(s): "AMMONIA" in the last 168 hours. Coagulation profile No results for input(s): "INR", "PROTIME" in the last 168 hours.  COVID-19 Labs  No results for input(s): "DDIMER", "FERRITIN", "LDH", "CRP" in the last 72 hours.   Lab Results  Component Value Date   SARSCOV2NAA NEGATIVE 07/20/2021   SARSCOV2NAA NEGATIVE 04/17/2021   [place] NEGATIVE 04/14/2021   [place] NEGATIVE 03/30/2021    CBC: Recent Labs  Lab 08/23/21 1547 08/24/21 0422 08/26/21 0440  WBC 7.2 6.7 5.9  NEUTROABS 6.5 5.6  --   HGB 11.7* 11.1* 10.6*  HCT 35.3* 34.0* 31.8*  MCV 86.5 87.6 85.5  PLT 191 183 179    Cardiac Enzymes: Recent Labs  Lab 08/23/21 1920  CKTOTAL 13*    BNP (last 3 results) No results for input(s): "PROBNP" in the last 8760 hours. CBG: No results for input(s): "GLUCAP" in the last 168 hours. D-Dimer: No results for input(s): "DDIMER" in the last 72 hours. Hgb A1c: No results for input(s): "HGBA1C" in the last 72 hours. Lipid Profile: No results for input(s): "CHOL", "HDL", "LDLCALC", "TRIG", "CHOLHDL", "LDLDIRECT" in the last 72 hours. Thyroid function studies: No results for input(s): "TSH", "T4TOTAL", "T3FREE", "THYROIDAB" in the last 72 hours.  Invalid input(s): "FREET3" Anemia work up: Recent Labs    08/26/21 0440  TIBC 213*  IRON 189*    Sepsis Labs: Recent Labs  Lab 08/23/21 1547 08/24/21 0422 08/26/21 0440  WBC 7.2 6.7 5.9    Microbiology No results found for this or any previous visit (from the past 240 hour(s)).   Medications:    amiodarone  200 mg Oral Daily   diclofenac  Sodium  4 g Topical QID   feeding supplement  1 Container Oral Q24H   feeding supplement  237 mL Oral Q24H   ketorolac  30 mg Intravenous Q8H   lidocaine  1 patch Transdermal Q24H   metoprolol succinate  50 mg Oral Daily   morphine  60 mg Oral Q12H   multivitamin with minerals  1 tablet Oral Daily   PARoxetine  10 mg Oral QHS   polyethylene glycol  17 g Oral BID   predniSONE  20 mg Oral Q breakfast   senna-docusate  2 tablet Oral BID   Vitamin D (Ergocalciferol)  50,000 Units Oral Q7 days   Continuous Infusions:  methocarbamol (ROBAXIN) IV        LOS: 4 days   Charlynne Cousins  [facility]  08/28/2021, 8:50 AM

## 2021-08-28 NOTE — Progress Notes (Signed)
Occupational Therapy Treatment Patient Details Name: John [name] MRN: [id number] DOB: [birth year] Today's Date: 08/28/2021   History of present illness Patient is a 61 year old male hwo presented to the hosptial on 7/9 with back pain. of note patient had recently presented to the ED on 7/6 with findings of new T12 fx with conservative recommendations. patient was unable to control pain and move around at home prompting trip back to hospital. PMH: a fib, GERD, metastatic lung cancer, obesity, orthostatic hypotension.   OT comments  Patient found on 4 L HFNC in bed. He reports multiple concerns with the biggest being he doesn't think he is ready to discharge and that he has barely been out of bed. He has a wife but she has to work. Today patient is modified independent with bed mobility - though his movements are slow and painful. He did not wear the TLSO - reporting it rubs his radiation site and making it more painful. He was min guard with a RW to walk to bathroom on 4 L [place] extended tubing. He was able to perform toilet transfer but unable to tolerate seated position for very long due to pain. He required assistance for perianal care. When he returned to seated position at edge of bed o2 sat found to be 78%. He needs assistance for LB dressing. He has very poor activity tolerance from pain and desaturation. Unsure how patient is going to be able to manage at home while his wife is at work. Though he was not overly receptive to the idea of short term rehab - therapist recommends this as the safest discharge plan.    Recommendations for follow up therapy are one component of a multi-disciplinary discharge planning process, led by the attending physician.  Recommendations may be updated based on patient status, additional functional criteria and insurance authorization.    Follow Up Recommendations  Skilled nursing-short term rehab (<3 hours/day)    Assistance Recommended at Discharge Frequent or  constant Supervision/Assistance  Patient can return home with the following  A little help with walking and/or transfers;A lot of help with bathing/dressing/bathroom;Assistance with cooking/housework;Help with stairs or ramp for entrance   Equipment Recommendations   (TBD)    Recommendations for Other Services      Precautions / Restrictions Precautions Precautions: Back Precaution Comments: Pt reports he got TLSO brace when presented to ED on 7/6 but does not recall receiving specific wear schedule/instructions (OOB vs. at all times, etc). Restrictions Weight Bearing Restrictions: No       Mobility Bed Mobility Overal bed mobility: Needs Assistance Bed Mobility: Rolling, Sidelying to Sit, Sit to Sidelying Rolling: Modified independent (Device/Increase time) Sidelying to sit: Supervision, HOB elevated Supine to sit: Supervision     General bed mobility comments: Supervision for bed mobility.    Transfers Overall transfer level: Needs assistance Equipment used: Rolling walker (2 wheels) Transfers: Sit to/from Stand Sit to Stand: Min guard           General transfer comment: Min guard to ambualte in room with RW to bathroom and back. No overt loss of balance but limited by pain. O2 sat found to be 78% on 4 L [place].     Balance Overall balance assessment: Mild deficits observed, not formally tested  ADL either performed or assessed with clinical judgement   ADL Overall ADL's : Needs assistance/impaired                         Toilet Transfer: Min guard;Cueing for safety;Grab bars;Rolling walker (2 wheels) Toilet Transfer Details (indicate cue type and reason): Ambulated to bathroom on 4 L [place]. Slow gait, reliance on walker to offload from back pain. ABle to transfer on to toilet but unable to maintain seated position due to pain. Toileting- Clothing Manipulation and Hygiene: Moderate assistance Toileting  - Clothing Manipulation Details (indicate cue type and reason): needs assistance for perianal care     Functional mobility during ADLs: Min guard;Rolling walker (2 wheels)      Extremity/Trunk Assessment Upper Extremity Assessment Upper Extremity Assessment: Overall WFL for tasks assessed   Lower Extremity Assessment Lower Extremity Assessment: Defer to PT evaluation   Cervical / Trunk Assessment Cervical / Trunk Assessment: Kyphotic (unable to maintain upright extension due to pain)    Vision Patient Visual Report: No change from baseline     Perception     Praxis      Cognition Arousal/Alertness: Awake/alert Behavior During Therapy: WFL for tasks assessed/performed Overall Cognitive Status: Within Functional Limits for tasks assessed                                          Exercises      Shoulder Instructions       General Comments      Pertinent Vitals/ Pain       Pain Assessment Pain Assessment: PAINAD Breathing: occasional labored breathing, short period of hyperventilation Negative Vocalization: occasional moan/groan, low speech, negative/disapproving quality Facial Expression: facial grimacing Body Language: tense, distressed pacing, fidgeting Consolability: distracted or reassured by voice/touch PAINAD Score: 6 Pain Location: low back, radiation site on right side Pain Descriptors / Indicators: Discomfort, Spasm, Sore, Grimacing, Guarding Pain Intervention(s): Monitored during session  Home Living                                          Prior Functioning/Environment              Frequency  Min 2X/week        Progress Toward Goals  OT Goals(current goals can now be found in the care plan section)  Progress towards OT goals: Progressing toward goals  Acute Rehab OT Goals Patient Stated Goal: less pain, walk to bathroom independently OT Goal Formulation: With patient Time For Goal Achievement:  09/07/21 Potential to Achieve Goals: Good  Plan Discharge plan remains appropriate    Co-evaluation                 AM-PAC OT "6 Clicks" Daily Activity     Outcome Measure   Help from another person eating meals?: None Help from another person taking care of personal grooming?: A Little Help from another person toileting, which includes using toliet, bedpan, or urinal?: A Lot Help from another person bathing (including washing, rinsing, drying)?: A Lot Help from another person to put on and taking off regular upper body clothing?: A Little Help from another person to put on and taking off regular lower body clothing?: A Lot 6 Click Score: 16  End of Session Equipment Utilized During Treatment: Oxygen;Rolling walker (2 wheels)  OT Visit Diagnosis: Unsteadiness on feet (R26.81);Pain;Muscle weakness (generalized) (M62.81)   Activity Tolerance Patient limited by pain   Patient Left in bed;with call bell/phone within reach;with bed alarm set   Nurse Communication  (o2 sat)        Time: 9030-0923 OT Time Calculation (min): 28 min  Charges: OT General Charges $OT Visit: 1 Visit OT Treatments $Self Care/Home Management : 8-22 mins $Therapeutic Activity: 8-22 mins  Yuritzi Kamp, OTR/L [place]  Office [phone] Pager: [place] 08/28/2021, 11:01 AM

## 2021-08-29 ENCOUNTER — Other Ambulatory Visit (HOSPITAL_COMMUNITY): Payer: Self-pay

## 2021-08-29 ENCOUNTER — Encounter: Payer: Self-pay | Admitting: Hematology & Oncology

## 2021-08-29 ENCOUNTER — Encounter: Payer: Self-pay | Admitting: Internal Medicine

## 2021-08-29 DIAGNOSIS — J449 Chronic obstructive pulmonary disease, unspecified: Secondary | ICD-10-CM | POA: Diagnosis not present

## 2021-08-29 DIAGNOSIS — J189 Pneumonia, unspecified organism: Secondary | ICD-10-CM | POA: Diagnosis not present

## 2021-08-29 DIAGNOSIS — S22080D Wedge compression fracture of T11-T12 vertebra, subsequent encounter for fracture with routine healing: Secondary | ICD-10-CM | POA: Diagnosis not present

## 2021-08-29 DIAGNOSIS — M549 Dorsalgia, unspecified: Secondary | ICD-10-CM | POA: Diagnosis not present

## 2021-08-29 LAB — CBC WITH DIFFERENTIAL/PLATELET
Abs Immature Granulocytes: 0.09 10*3/uL — ABNORMAL HIGH (ref 0.00–0.07)
Basophils Absolute: 0 10*3/uL (ref 0.0–0.1)
Basophils Relative: 1 %
Eosinophils Absolute: 0.1 10*3/uL (ref 0.0–0.5)
Eosinophils Relative: 1 %
HCT: 32.5 % — ABNORMAL LOW (ref 39.0–52.0)
Hemoglobin: 10.5 g/dL — ABNORMAL LOW (ref 13.0–17.0)
Immature Granulocytes: 1 %
Lymphocytes Relative: 5 %
Lymphs Abs: 0.3 10*3/uL — ABNORMAL LOW (ref 0.7–4.0)
MCH: 28.6 pg (ref 26.0–34.0)
MCHC: 32.3 g/dL (ref 30.0–36.0)
MCV: 88.6 fL (ref 80.0–100.0)
Monocytes Absolute: 0.8 10*3/uL (ref 0.1–1.0)
Monocytes Relative: 12 %
Neutro Abs: 5.4 10*3/uL (ref 1.7–7.7)
Neutrophils Relative %: 80 %
Platelets: 222 10*3/uL (ref 150–400)
RBC: 3.67 MIL/uL — ABNORMAL LOW (ref 4.22–5.81)
RDW: 18 % — ABNORMAL HIGH (ref 11.5–15.5)
WBC: 6.6 10*3/uL (ref 4.0–10.5)
nRBC: 0 % (ref 0.0–0.2)

## 2021-08-29 LAB — COMPREHENSIVE METABOLIC PANEL
ALT: 17 U/L (ref 0–44)
AST: 13 U/L — ABNORMAL LOW (ref 15–41)
Albumin: 3.1 g/dL — ABNORMAL LOW (ref 3.5–5.0)
Alkaline Phosphatase: 74 U/L (ref 38–126)
Anion gap: 7 (ref 5–15)
BUN: 18 mg/dL (ref 6–20)
CO2: 32 mmol/L (ref 22–32)
Calcium: 8.4 mg/dL — ABNORMAL LOW (ref 8.9–10.3)
Chloride: 99 mmol/L (ref 98–111)
Creatinine, Ser: 0.87 mg/dL (ref 0.61–1.24)
GFR, Estimated: >60 mL/min (ref >60)
Glucose, Bld: 77 mg/dL (ref 70–99)
Potassium: 4.2 mmol/L (ref 3.5–5.1)
Sodium: 138 mmol/L (ref 135–145)
Total Bilirubin: 0.5 mg/dL (ref 0.3–1.2)
Total Protein: 6.5 g/dL (ref 6.5–8.1)

## 2021-08-29 MED ORDER — POLYETHYLENE GLYCOL 3350 17 G PO PACK
17.0000 g | PACK | Freq: Every day | ORAL | 0 refills | Status: DC | PRN
  Filled 2021-08-29: qty 14, 14d supply, fill #0

## 2021-08-29 MED ORDER — MORPHINE SULFATE ER 60 MG PO TBCR
60.0000 mg | EXTENDED_RELEASE_TABLET | Freq: Two times a day (BID) | ORAL | 0 refills | Status: AC
  Filled 2021-08-29: qty 14, 7d supply, fill #0

## 2021-08-29 MED ORDER — OXYCODONE HCL 10 MG PO TABS
10.0000 mg | ORAL_TABLET | ORAL | 0 refills | Status: DC | PRN
  Filled 2021-08-29: qty 30, 5d supply, fill #0

## 2021-08-29 NOTE — Discharge Summary (Signed)
Physician Discharge Summary  John [name] NAT:[id number] DOB: 22-Aug-1960 DOA: 08/23/2021  PCP: [name], MD  Admit date: 08/23/2021 Discharge date: 08/29/2021  Admitted From: Home Disposition:  Home  Recommendations for Outpatient Follow-up:  Follow up with oncology in 1-2 weeks Patient is awaiting insurance for kyphoplasty as an outpatient.  We will follow-up with interventional radiology if approved.  Home Health:Yes Equipment/Devices:None  Discharge Condition:Stable CODE STATUS:Full Diet recommendation: Heart Healthy   Brief/Interim Summary:  61 y.o. male past medical history of metastatic lung cancer, paroxysmal atrial fibrillation rheumatoid arthritis history of seizures comes in to the hospital for back pain, he relates that 2 weeks prior to admission he hit himself against the washer since then has been having back pain went into the ED on 08/20/2021 they found him to have a T12 compression fracture, neurosurgery recommended DLCO discharged home with follow-up as an outpatient with narcotics.  Has been having a hard time managing his ADLs, basically bedbound unable to get relief came back to the ED    Discharge Diagnoses:  Principal Problem:   T12 compression fracture ([place]) Active Problems:   Paroxysmal atrial fibrillation with RVR ([place])   Drug-induced pneumonitis   COPD with asthma ([place])   Normocytic anemia   Coronary artery disease involving native coronary artery of native heart without angina pectoris   Stage 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 (HCC)   Mixed hyperlipidemia   Intractable back pain  Acute T12 compression fraction: Likely related to his lung cancer. It was hard to control his pain with using several doses of IV and oral. He was started on long-acting narcotic plus short acting which after titration and eventually control his pain. Oncology was consulted recommended kyphoplasty, and we are waiting for insurance for kyphoplasty procedure. He  will be discharged home and follow-up with interventional radiology to perform kyphoplasty as an outpatient once improved. He will continue Eliquis. Continue MiraLAX.  Paroxysmal atrial fibrillation: Rate control on metoprolol continue Eliquis.  Stage IV squamous cell lung right lung cancer: Noted.   Hyperlipidemia: Continue statin  Discharge Instructions  Discharge Instructions     Diet - low sodium heart healthy   Complete by: As directed    Increase activity slowly   Complete by: As directed       Allergies as of 08/29/2021       Reactions   Albuterol Other (See Comments)   Patient has had episode of atrial fib following administration of albuterol (tolerates Xopenex well)   Atorvastatin Other (See Comments)   Causes arthritis pain        Medication List     STOP taking these medications    oxyCODONE-acetaminophen 5-325 MG tablet Commonly known as: PERCOCET/ROXICET       TAKE these medications    acetaminophen 500 MG tablet Commonly known as: TYLENOL Take 1,000 mg by mouth every 6 (six) hours as needed for mild pain.   amiodarone 200 MG tablet Commonly known as: Pacerone Take 1 tablet (200 mg total) by mouth daily. What changed:  when to take this additional instructions   apixaban 5 MG Tabs tablet Commonly known as: Eliquis Take 1 tablet (5 mg total) by mouth 2 (two) times daily.   benzonatate 200 MG capsule Commonly known as: TESSALON Take 1 capsule (200 mg total) by mouth 3 (three) times daily as needed for cough.   metoprolol succinate 50 MG 24 hr tablet Commonly known as: TOPROL-XL Take 1 tablet (50 mg total) by mouth daily. Take  with or immediately following a meal.   morphine 60 MG 12 hr tablet Commonly known as: MS CONTIN Take 1 tablet (60 mg total) by mouth every 12 (twelve) hours for 7 days.   omeprazole 20 MG tablet Commonly known as: PRILOSEC OTC Take 20 mg by mouth at bedtime.   Oxycodone HCl 10 MG Tabs Take 1 tablet (10  mg total) by mouth every 4 (four) hours as needed for up to 7 days for moderate pain. What changed:  medication strength how much to take when to take this reasons to take this   PARoxetine 10 MG tablet Commonly known as: PAXIL Take 10 mg by mouth at bedtime.   polyethylene glycol 17 g packet Commonly known as: MIRALAX / GLYCOLAX Take 17 g by mouth daily as needed for severe constipation.   predniSONE 10 MG tablet Commonly known as: DELTASONE Take 4 tablets (40 mg total) by mouth daily with breakfast for 21 days, THEN 3 tablets (30 mg total) daily with breakfast for 5 days, THEN 2 tablets (20 mg total) daily with breakfast for 5 days, THEN 1 tablet (10 mg total) daily with breakfast for 5 days. Start taking on: July 26, 2021        Allergies  Allergen Reactions   Albuterol Other (See Comments)    Patient has had episode of atrial fib following administration of albuterol (tolerates Xopenex well)   Atorvastatin Other (See Comments)    Causes arthritis pain    Consultations: Oncology   Procedures/Studies: MR Lumbar Spine W Wo Contrast  Result Date: 08/20/2021 CLINICAL DATA:  Back pain, compression fractures on CT EXAM: MRI LUMBAR SPINE WITHOUT AND WITH CONTRAST TECHNIQUE: Multiplanar and multiecho pulse sequences of the lumbar spine were obtained without and with intravenous contrast. CONTRAST:  54mL GADAVIST GADOBUTROL 1 MMOL/ML IV SOLN COMPARISON:  Correlation made with CT abdomen pelvis 07/14/2021 FINDINGS: Segmentation:  Standard. Alignment: No significant listhesis. No significant endplate retropulsion associated with compression fractures. Levocurvature. Vertebrae: Chronic compression fractures at L2, L4, and L5. New T12 compression fracture with approximately 50% loss of vertebral body height. Fracture lines are visible with STIR hyperintensity. There is mild marrow edema underlying the endplate. New L3 compression fracture with nearly 50% loss of height centrally. There is  underlying mild marrow edema. There is enhancement at T12 and L3. Conus medullaris and cauda equina: Conus extends to the T12 level. Conus and cauda equina appear normal. Paraspinal and other soft tissues: Unremarkable. Disc levels: L1-L2: Disc bulge. Minor facet arthropathy. No significant canal or foraminal stenosis. L2-L3: Disc bulge. Minor facet arthropathy. Mild canal stenosis. Minor foraminal stenosis. L3-L4: Disc bulge. Minor facet arthropathy. Moderate canal stenosis. Slight effacement of subarticular recesses. Minor left foraminal stenosis. L4-L5: Disc bulge. Mild facet arthropathy with ligamentum flavum infolding. Moderate marked canal stenosis. Slight effacement of subarticular recesses. No significant foraminal stenosis. L5-S1: Disc bulge. Mild right and moderate left facet arthropathy. No significant canal or foraminal stenosis. IMPRESSION: Acute to subacute T12 and L3 compression fractures with approximately 50% loss of vertebral body height. No definite evidence of underlying lesion. Given that there are other chronic compression fractures present, these are favored to be benign. Electronically Signed   By: Macy Mis M.D.   On: 08/20/2021 15:46   CT ABDOMEN PELVIS W CONTRAST  Result Date: 08/20/2021 CLINICAL DATA:  Metastatic disease, worsening right lower back pain. Rule out retroperitoneal bleed . EXAM: CT ABDOMEN AND PELVIS WITH CONTRAST TECHNIQUE: Multidetector CT imaging of the abdomen and pelvis  was performed using the standard protocol following bolus administration of intravenous contrast. RADIATION DOSE REDUCTION: This exam was performed according to the departmental dose-optimization program which includes automated exposure control, adjustment of the mA and/or kV according to patient size and/or use of iterative reconstruction technique. CONTRAST:  165mL OMNIPAQUE IOHEXOL 350 MG/ML SOLN COMPARISON:  None Available. FINDINGS: Lower chest: Chronic fibrosis/atelectasis of the right  lung, not significantly changed. Hepatobiliary: No focal liver abnormality is seen. Rim calcified gallstone without gallbladder wall thickening, or biliary dilatation. Pancreas: Moderate fatty infiltration of the pancreas. Spleen: Normal in size without focal abnormality. Adrenals/Urinary Tract: Large right adrenal heterogeneous mass measuring at least 4.2 x 5.6 x 5.6 cm, not significantly changed. Kidneys are unremarkable without evidence of nephrolithiasis or hydronephrosis. Urinary bladder is unremarkable. Stomach/Bowel: Stomach is within normal limits. Appendix appears normal. No evidence of bowel wall thickening, distention, or inflammatory changes. Vascular/Lymphatic: Aortic atherosclerosis. Unchanged infrarenal abdominal aortic aneurysm measuring up to 2.7 x 2.7 cm. Follow-up examination every 5 years is recommended. No enlarged abdominal or pelvic lymph nodes. Reproductive: Prostate is unremarkable. Other: Fat containing left inguinal hernia. No abdominopelvic ascites. Musculoskeletal: New compression deformity of the T12 vertebral body with approximately 50% anterior vertebral body height loss. There is also new superior endplate compression deformity of L3 vertebral body. Chronic superior endplate deformities of L2 and L5 are unchanged. IMPRESSION: 1. New compression deformity of T12 vertebral body with approximately 50% anterior vertebral body height loss. 2.  New superior endplate deformity of L3 vertebral body. 3. Stable appearance of the right adrenal mass measuring 4.2 x 5.6 cm. 4.  Additional chronic findings as above. Electronically Signed   By: Keane Police D.O.   On: 08/20/2021 14:01   CT Angio Chest PE W and/or Wo Contrast  Result Date: 08/20/2021 CLINICAL DATA:  Lower back pain.  Increased shortness of breath. EXAM: CT ANGIOGRAPHY CHEST WITH CONTRAST TECHNIQUE: Multidetector CT imaging of the chest was performed using the standard protocol during bolus administration of intravenous contrast.  Multiplanar CT image reconstructions and MIPs were obtained to evaluate the vascular anatomy. RADIATION DOSE REDUCTION: This exam was performed according to the departmental dose-optimization program which includes automated exposure control, adjustment of the mA and/or kV according to patient size and/or use of iterative reconstruction technique. CONTRAST:  158mL OMNIPAQUE IOHEXOL 350 MG/ML SOLN COMPARISON:  CT examination dated Jul 14, 2021 FINDINGS: Cardiovascular: Satisfactory opacification of the pulmonary arteries to the segmental level. No evidence of pulmonary embolism. Normal heart size. No pericardial effusion. Mediastinum/Nodes: No enlarged mediastinal, hilar, or axillary lymph nodes. Thyroid gland, trachea, and esophagus demonstrate no significant findings. Lungs/Pleura: Right perihilar fibrosis/atelectasis, unchanged from prior examination suggesting postradiation changes. Multiple small thin walled cysts. Diffuse ground-glass attenuation of the lung parenchyma with bilateral peripheral fibrosis consistent with nonspecific interstitial pneumonia, unchanged. No evidence of pneumothorax or large pleural effusion. Upper Abdomen: Cholelithiasis without evidence of acute cholecystitis. Moderate pancreatic atrophy. No acute abnormality. Musculoskeletal: Osteopenia and mild degenerate disc disease of the thoracic spine. No acute osseous abnormality. Review of the MIP images confirms the above findings. IMPRESSION: 1.  No evidence of pulmonary embolism. 2. Fibrosis/cystic changes and atelectasis of the right hilar region, most consistent with postradiation changes, not significantly changed from prior examination. 3. 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 4.  No acute upper abdominal process. Aortic Atherosclerosis (ICD10-I70.0) and Emphysema (ICD10-J43.9). Electronically Signed   By: Keane Police D.O.   On:  08/20/2021 13:46    DG Chest Portable 1 View  Result Date: 08/20/2021 CLINICAL DATA:  Short of breath.  History of lung cancer EXAM: PORTABLE CHEST 1 VIEW COMPARISON:  08/02/2021.  Chest CT 07/14/2021 FINDINGS: Right perihilar and right lower lobe airspace disease with mild progression. Elevated right hemidiaphragm. No significant effusion or pneumothorax on the right Left lung remains clear.  Negative for heart failure. IMPRESSION: Mild progression of right lower lobe airspace disease. Electronically Signed   By: Franchot Gallo M.D.   On: 08/20/2021 12:03   VAS [place] LOWER EXTREMITY VENOUS (DVT)  Result Date: 08/05/2021  Lower Venous DVT Study Patient Name:  John [name]  Date of Exam:   08/05/2021 Medical Rec #: [id number]      Accession #:    [id number] Date of Birth: 10/26/1960      Patient Gender: M Patient Age:   46 years Exam Location:  [name] Procedure:      VAS [place] LOWER EXTREMITY VENOUS (DVT) Referring Phys: PRANAV PATEL --------------------------------------------------------------------------------  Indications: Edema.  Risk Factors: Cancer. Comparison Study: No prior studies. Performing Technologist: Oliver Hum RVT  Examination Guidelines: A complete evaluation includes B-mode imaging, spectral Doppler, color Doppler, and power Doppler as needed of all accessible portions of each vessel. Bilateral testing is considered an integral part of a complete examination. Limited examinations for reoccurring indications may be performed as noted. The reflux portion of the exam is performed with the patient in reverse Trendelenburg.  +---------+---------------+---------+-----------+----------+--------------+ RIGHT    CompressibilityPhasicitySpontaneityPropertiesThrombus Aging +---------+---------------+---------+-----------+----------+--------------+ CFV      Full           Yes      Yes                                 +---------+---------------+---------+-----------+----------+--------------+ SFJ       Full                                                        +---------+---------------+---------+-----------+----------+--------------+ FV Prox  Full                                                        +---------+---------------+---------+-----------+----------+--------------+ FV Mid   Full                                                        +---------+---------------+---------+-----------+----------+--------------+ FV DistalFull                                                        +---------+---------------+---------+-----------+----------+--------------+ PFV      Full                                                        +---------+---------------+---------+-----------+----------+--------------+  POP      Full           Yes      Yes                                 +---------+---------------+---------+-----------+----------+--------------+ PTV      Full                                                        +---------+---------------+---------+-----------+----------+--------------+ PERO     Full                                                        +---------+---------------+---------+-----------+----------+--------------+   +----+---------------+---------+-----------+----------+--------------+ LEFTCompressibilityPhasicitySpontaneityPropertiesThrombus Aging +----+---------------+---------+-----------+----------+--------------+ CFV Full           Yes      Yes                                 +----+---------------+---------+-----------+----------+--------------+     Summary: RIGHT: - There is no evidence of deep vein thrombosis in the lower extremity.  - No cystic structure found in the popliteal fossa.  LEFT: - No evidence of common femoral vein obstruction.  *See table(s) above for measurements and observations. Electronically signed by Harold Barban MD on 08/05/2021 at 9:24:38 PM.    Final    ECHOCARDIOGRAM COMPLETE  Result Date:  08/02/2021    ECHOCARDIOGRAM REPORT   Patient Name:   John [name] Date of Exam: 08/02/2021 Medical Rec #:  [id number]     Height:       71.0 in Accession #:    [id number]    Weight:       231.2 lb Date of Birth:  Nov 05, 1960     BSA:          2.243 m Patient Age:    59 years      BP:           97/72 mmHg Patient Gender: M             HR:           120 bpm. Exam Location:  Inpatient Procedure: 2D Echo, Cardiac Doppler, Color Doppler and Intracardiac            Opacification Agent Indications:    Atrial Fibrillation I48.91  History:        Patient has prior history of Echocardiogram examinations, most                 recent 10/13/2020. CAD, COPD, Arrythmias:Atrial Fibrillation;                 Risk Factors:Dyslipidemia and Former Smoker. Lung cancer.                 Drug-induced pneumonitis.  Sonographer:    Darlina Sicilian RDCS Referring Phys: [id number] [place]  1. Left ventricular ejection fraction, by estimation, is 60 to 65%. The left ventricle has normal function. Left ventricular endocardial border not optimally defined to evaluate regional wall motion even with definity  contrast. Left ventricular diastolic function could not be evaluated.  2. Right ventricular systolic function was not well visualized. The right ventricular size is not well visualized. Tricuspid regurgitation signal is inadequate for assessing PA pressure.  3. The mitral valve is normal in structure. No evidence of mitral valve regurgitation. No evidence of mitral stenosis.  4. The aortic valve is normal in structure. Aortic valve regurgitation is not visualized. No aortic stenosis is present.  5. Aortic dilatation noted. There is mild dilatation of the aortic root, measuring 40 mm.  6. The inferior vena cava is normal in size with greater than 50% respiratory variability, suggesting right atrial pressure of 3 mmHg. FINDINGS  Left Ventricle: Left ventricular ejection fraction, by estimation, is 60 to 65%. The left ventricle has  normal function. Left ventricular endocardial border not optimally defined to evaluate regional wall motion. Definity contrast agent was given IV to delineate the left ventricular endocardial borders. The left ventricular internal cavity size was normal in size. There is no left ventricular hypertrophy. Left ventricular diastolic function could not be evaluated due to atrial fibrillation. Left ventricular diastolic function could not be evaluated. Right Ventricle: The right ventricular size is not well visualized. Right vetricular wall thickness was not assessed. Right ventricular systolic function was not well visualized. Tricuspid regurgitation signal is inadequate for assessing PA pressure. Left Atrium: Left atrial size was not well visualized. Right Atrium: Right atrial size was not well visualized. Pericardium: There is no evidence of pericardial effusion. Mitral Valve: The mitral valve is normal in structure. No evidence of mitral valve regurgitation. No evidence of mitral valve stenosis. Tricuspid Valve: The tricuspid valve is normal in structure. Tricuspid valve regurgitation is not demonstrated. No evidence of tricuspid stenosis. Aortic Valve: The aortic valve is normal in structure. Aortic valve regurgitation is not visualized. No aortic stenosis is present. Pulmonic Valve: The pulmonic valve was normal in structure. Pulmonic valve regurgitation is not visualized. No evidence of pulmonic stenosis. Aorta: Aortic dilatation noted. There is mild dilatation of the aortic root, measuring 40 mm. Venous: The inferior vena cava is normal in size with greater than 50% respiratory variability, suggesting right atrial pressure of 3 mmHg. IAS/Shunts: No atrial level shunt detected by color flow Doppler.  LEFT VENTRICLE PLAX 2D LVIDd:         5.20 cm LVIDs:         3.50 cm LV PW:         1.20 cm LV IVS:        1.10 cm LVOT diam:     2.30 cm LVOT Area:     4.15 cm  LEFT ATRIUM         Index LA diam:    3.80 cm 1.69  cm/m   AORTA Ao Root diam: 4.00 cm MITRAL VALVE MV Area (PHT): 3.70 cm    SHUNTS MV Decel Time: 205 msec    Systemic Diam: 2.30 cm MV E velocity: 69.40 cm/s Fransico Him MD Electronically signed by Fransico Him MD Signature Date/Time: 08/02/2021/12:50:28 PM    Final    DG Chest Port 1 View  Result Date: 08/02/2021 CLINICAL DATA:  Heart palpitations today.  History of lung cancer. EXAM: PORTABLE CHEST 1 VIEW COMPARISON:  07/20/2021 FINDINGS: Heart size and pulmonary vascularity are normal. Right hilar mass similar to prior study likely corresponding to known cancer and/or treatment changes. Probable pleural thickening on the right. Left lung is clear. Probable emphysematous changes. IMPRESSION: Stable appearance of the chest since  previous study. Right perihilar mass corresponding to known cancer in treatment changes. Right pleural thickening. Electronically Signed   By: Lucienne Capers M.D.   On: 08/02/2021 02:08   (Echo, Carotid, EGD, Colonoscopy, ERCP)    Subjective: Pain is controlled having regular bowel movements.  Discharge Exam: Vitals:   08/28/21 2011 08/29/21 0510  BP: 107/76 105/68  Pulse: 76 76  Resp: 19 19  Temp: 98.2 F (36.8 C) 97.6 F (36.4 C)  SpO2: 96% 95%   Vitals:   08/28/21 1219 08/28/21 1533 08/28/21 2011 08/29/21 0510  BP: 114/87  107/76 105/68  Pulse: 87  76 76  Resp: 18  19 19   Temp: 97.6 F (36.4 C)  98.2 F (36.8 C) 97.6 F (36.4 C)  TempSrc: Oral  Oral Oral  SpO2: 96% 98% 96% 95%  Weight:      Height:        General: Pt is alert, awake, not in acute distress Cardiovascular: RRR, S1/S2 +, no rubs, no gallops Respiratory: CTA bilaterally, no wheezing, no rhonchi Abdominal: Soft, NT, ND, bowel sounds + Extremities: no edema, no cyanosis    The results of significant diagnostics from this hospitalization (including imaging, microbiology, ancillary and laboratory) are listed below for reference.     Microbiology: No results found for this or  any previous visit (from the past 240 hour(s)).   Labs: BNP (last 3 results) Recent Labs    07/20/21 2132 08/01/21 2301 08/20/21 1139  BNP 23.1 69.1 09.7   Basic Metabolic Panel: Recent Labs  Lab 08/23/21 1547 08/23/21 1920 08/24/21 0422 08/26/21 0440 08/29/21 0412  NA 135  --  138 136 138  K 4.4  --  4.0 3.8 4.2  CL 98  --  99 95* 99  CO2 29  --  31 31 32  GLUCOSE 164*  --  107* 113* 77  BUN 14  --  15 16 18   CREATININE 0.89  --  0.95 0.90 0.87  CALCIUM 8.5*  --  9.0 8.9 8.4*  MG  --  2.0  --  2.0  --   PHOS  --  4.2  --   --   --    Liver Function Tests: Recent Labs  Lab 08/23/21 1547 08/24/21 0422 08/29/21 0412  AST 13* 15 13*  ALT 19 18 17   ALKPHOS 69 70 74  BILITOT 0.8 0.8 0.5  PROT 6.4* 6.2* 6.5  ALBUMIN 3.0* 3.1* 3.1*   No results for input(s): "LIPASE", "AMYLASE" in the last 168 hours. No results for input(s): "AMMONIA" in the last 168 hours. CBC: Recent Labs  Lab 08/23/21 1547 08/24/21 0422 08/26/21 0440 08/29/21 0412  WBC 7.2 6.7 5.9 6.6  NEUTROABS 6.5 5.6  --  5.4  HGB 11.7* 11.1* 10.6* 10.5*  HCT 35.3* 34.0* 31.8* 32.5*  MCV 86.5 87.6 85.5 88.6  PLT 191 183 179 222   Cardiac Enzymes: Recent Labs  Lab 08/23/21 1920  CKTOTAL 13*   BNP: Invalid input(s): "POCBNP" CBG: No results for input(s): "GLUCAP" in the last 168 hours. D-Dimer No results for input(s): "DDIMER" in the last 72 hours. Hgb A1c No results for input(s): "HGBA1C" in the last 72 hours. Lipid Profile No results for input(s): "CHOL", "HDL", "LDLCALC", "TRIG", "CHOLHDL", "LDLDIRECT" in the last 72 hours. Thyroid function studies No results for input(s): "TSH", "T4TOTAL", "T3FREE", "THYROIDAB" in the last 72 hours.  Invalid input(s): "FREET3" Anemia work up No results for input(s): "VITAMINB12", "FOLATE", "FERRITIN", "TIBC", "IRON", "RETICCTPCT" in the  last 72 hours. Urinalysis    Component Value Date/Time   COLORURINE YELLOW 08/23/2021 1929   APPEARANCEUR CLEAR  08/23/2021 1929   LABSPEC 1.020 08/23/2021 1929   PHURINE 6.0 08/23/2021 1929   GLUCOSEU NEGATIVE 08/23/2021 1929   HGBUR NEGATIVE 08/23/2021 [place] NEGATIVE 08/23/2021 [place] NEGATIVE 08/23/2021 1929   PROTEINUR NEGATIVE 08/23/2021 1929   NITRITE NEGATIVE 08/23/2021 1929   LEUKOCYTESUR NEGATIVE 08/23/2021 1929   Sepsis Labs Recent Labs  Lab 08/23/21 1547 08/24/21 0422 08/26/21 0440 08/29/21 0412  WBC 7.2 6.7 5.9 6.6   Microbiology No results found for this or any previous visit (from the past 240 hour(s)).   SIGNED:   Charlynne Cousins, MD  [facility] 08/29/2021, 9:26 AM Pager   If 7PM-7AM, please contact night-coverage [email or web address] Password [email or web address]

## 2021-08-29 NOTE — Progress Notes (Signed)
AVS given to patient and explained at the bedside. Medications and follow up appointments have been explained with pt verbalizing understanding.  

## 2021-08-31 ENCOUNTER — Telehealth: Payer: Self-pay | Admitting: *Deleted

## 2021-08-31 ENCOUNTER — Ambulatory Visit: Payer: 59

## 2021-08-31 ENCOUNTER — Other Ambulatory Visit: Payer: Self-pay | Admitting: *Deleted

## 2021-08-31 ENCOUNTER — Telehealth (HOSPITAL_BASED_OUTPATIENT_CLINIC_OR_DEPARTMENT_OTHER): Payer: Self-pay

## 2021-08-31 DIAGNOSIS — C3410 Malignant neoplasm of upper lobe, unspecified bronchus or lung: Secondary | ICD-10-CM

## 2021-08-31 NOTE — Telephone Encounter (Signed)
[name] called this morning to let us know that patient was just discharged from the hospital and she is having trouble managing him from home.  Requesting a palliative care consult.  [place] with Dr [name].  Consult placed via computer.  Called [facility] to follow up

## 2021-09-01 ENCOUNTER — Other Ambulatory Visit (HOSPITAL_COMMUNITY): Payer: Self-pay

## 2021-09-01 ENCOUNTER — Ambulatory Visit: Admission: RE | Admit: 2021-09-01 | Payer: 59 | Source: Ambulatory Visit

## 2021-09-02 ENCOUNTER — Ambulatory Visit: Payer: 59

## 2021-09-03 ENCOUNTER — Encounter (HOSPITAL_COMMUNITY): Payer: Self-pay

## 2021-09-03 ENCOUNTER — Encounter: Payer: Self-pay | Admitting: *Deleted

## 2021-09-03 ENCOUNTER — Other Ambulatory Visit (HOSPITAL_COMMUNITY): Payer: Self-pay | Admitting: Neuroradiology

## 2021-09-03 ENCOUNTER — Telehealth: Payer: Self-pay

## 2021-09-03 ENCOUNTER — Telehealth (HOSPITAL_COMMUNITY): Payer: Self-pay | Admitting: *Deleted

## 2021-09-03 ENCOUNTER — Other Ambulatory Visit: Payer: Self-pay | Admitting: *Deleted

## 2021-09-03 ENCOUNTER — Ambulatory Visit
Admission: RE | Admit: 2021-09-03 | Discharge: 2021-09-03 | Disposition: A | Discharge status: Home / Self Care | Payer: 59 | Source: Ambulatory Visit | Attending: Physician Assistant | Admitting: Physician Assistant

## 2021-09-03 DIAGNOSIS — S32030A Wedge compression fracture of third lumbar vertebra, initial encounter for closed fracture: Secondary | ICD-10-CM

## 2021-09-03 DIAGNOSIS — S22080D Wedge compression fracture of T11-T12 vertebra, subsequent encounter for fracture with routine healing: Secondary | ICD-10-CM

## 2021-09-03 DIAGNOSIS — S22080A Wedge compression fracture of T11-T12 vertebra, initial encounter for closed fracture: Secondary | ICD-10-CM

## 2021-09-03 DIAGNOSIS — M545 Low back pain, unspecified: Secondary | ICD-10-CM

## 2021-09-03 HISTORY — PX: IR RADIOLOGIST EVAL & MGMT: IMG5224

## 2021-09-03 MED ORDER — OXYCODONE HCL 10 MG PO TABS: 10.0000 mg | ORAL_TABLET | ORAL | 0 refills | Status: AC | PRN

## 2021-09-03 NOTE — Telephone Encounter (Signed)
Spoke with patient's spouse [name] and scheduled a Mychart Palliative Consult for 09/07/21 @ 2 PM.  Consent obtained; updated Netsmart, Team List and Epic.

## 2021-09-03 NOTE — Telephone Encounter (Signed)
Vicky from [facility] calling regarding pt needing kyphoplasty and will need to hold Eliquis 2 days prior to procedure. Needing permission for this.

## 2021-09-03 NOTE — Consult Note (Addendum)
Chief Complaint: Patient was seen in consultation today for painful vertebral compression fracture at the request of John [name]  Referring Physician(s): Burney Gauze MD  History of Present Illness: John [name] is [name] 61 y.o. 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  Patient has history of rheumatoid arthritis status post extensive prednisone use especially in the past year and [name] half.  When bending over approximately 3 to 4 weeks ago he felt [name] pop, and fell.  He has had worse low back pain since then.  Imaging showed subacute unhealed T12 and L3 compression fracture deformities and an old healed L4 compression fracture.  He is having [name] hard time managing his activities of daily living, requiring up to 50% of his time in bed with incomplete relief of pain.  There is hospitalization for pain control this was achieved with narcotic pain medications.  He is on Eliquis due to paroxysmal atrial fibrillation.  Past Medical History:  Diagnosis Date   Atrial fibrillation with RVR ([place]) 05/12/2020   Dyspnea    Family history of adverse reaction to anesthesia    brother with seizures had episode under anesthesia.  Patient has seizures as well.   GERD (gastroesophageal reflux disease)    History of radiation therapy 04/24/2020-05/16/2020   IMRT to right lung     Dr [name]   PAF (paroxysmal atrial fibrillation) ([place])    CHADS2VSAC score 0   Rheumatoid aortitis    Sepsis ([place]) 10/06/2020   Squamous cell lung cancer [facility])     Past Surgical History:  Procedure Laterality Date   BRONCHIAL BRUSHINGS  03/27/2020   Procedure: BRONCHIAL BRUSHINGS;  Surgeon: [name], MD;  Location: [place];  Service: Cardiopulmonary;;   BUBBLE STUDY  10/13/2020   Procedure: BUBBLE STUDY;  Surgeon:  Pixie Casino, MD;  Location: [place];  Service: Cardiovascular;;   CARDIOVERSION N/[name] 10/13/2020   Procedure: CARDIOVERSION;  Surgeon: Pixie Casino, MD;  Location: [facility] ENDOSCOPY;  Service: Cardiovascular;  Laterality: N/[name];   CATARACT EXTRACTION  2016   at [place]  03/27/2020   Procedure: [place];  Surgeon: [name], MD;  Location: [place];  Service: Cardiopulmonary;;   HIP SURGERY Left    IR RADIOLOGIST EVAL & MGMT  09/03/2021   TEE WITHOUT CARDIOVERSION N/[name] 10/13/2020   Procedure: TRANSESOPHAGEAL ECHOCARDIOGRAM (TEE);  Surgeon: Pixie Casino, MD;  Location: [place];  Service: Cardiovascular;  Laterality: N/[name];   VIDEO BRONCHOSCOPY WITH ENDOBRONCHIAL ULTRASOUND N/[name] 03/27/2020   Procedure: VIDEO BRONCHOSCOPY WITH ENDOBRONCHIAL ULTRASOUND;  Surgeon: [name], MD;  Location: [place];  Service: Cardiopulmonary;  Laterality: N/[name];    Allergies: Albuterol and Atorvastatin  Medications: Prior to Admission medications   Medication Sig Start Date End Date Taking? Authorizing Provider  acetaminophen (TYLENOL) 500 MG tablet Take 1,000 mg by mouth every 6 (six) hours as needed for mild pain.    [provider]  amiodarone (PACERONE) 200 MG tablet Take 1 tablet (200 mg total) by mouth daily. Patient taking differently: Take 200 mg by mouth See admin instructions. Ordered at discharge (08/07/21) - take one tablet (200 mg) twice daily, on 08/24/21 change to one tablet once daily 08/24/21   Fenton, Clint R, PA  apixaban (ELIQUIS) 5 MG TABS tablet Take 1 tablet (5 mg total)  by mouth 2 (two) times daily. 01/05/21   Fenton, Clint R, PA  benzonatate (TESSALON) 200 MG capsule Take 1 capsule (200 mg total) by mouth 3 (three) times daily as needed for cough. 07/23/21   Martyn Ehrich, NP  metoprolol succinate (TOPROL-XL) 50 MG 24 hr tablet Take 1 tablet (50 mg total) by mouth daily. Take with or immediately following [name] meal. 08/08/21  09/07/21  Darliss Cheney, MD  morphine (MS CONTIN) 60 MG 12 hr tablet Take 1 tablet (60 mg total) by mouth every 12 (twelve) hours for 7 days. 08/29/21 09/05/21  Charlynne Cousins, MD  omeprazole (PRILOSEC OTC) 20 MG tablet Take 20 mg by mouth at bedtime.    [provider]  Oxycodone HCl 10 MG TABS Take 1 tablet (10 mg total) by mouth every 4 (four) hours as needed for up to 7 days for moderate pain. 08/29/21 09/05/21  Charlynne Cousins, MD  PARoxetine (PAXIL) 10 MG tablet Take 10 mg by mouth at bedtime. 01/25/20   [provider]  polyethylene glycol (MIRALAX / GLYCOLAX) 17 g packet Take 17 g by mouth daily as needed for severe constipation. 08/29/21   Charlynne Cousins, MD  prochlorperazine (COMPAZINE) 10 MG tablet Take 1 tablet (10 mg total) by mouth every 6 (six) hours as needed (Nausea or vomiting). Patient not taking: Reported on 06/29/2021 01/29/21 07/20/21  Volanda Napoleon, MD     Family History  Problem Relation Age of Onset   Arrhythmia Mother        has PPM   Heart disease Father        Died at 67, started in his 31s, heart attacks, had PPM and ICD    Social History   Socioeconomic History   Marital status: Married    Spouse name: Not on file   Number of children: Not on file   Years of education: Not on file   Highest education level: Not on file  Occupational History   Not on file  Tobacco Use   Smoking status: Former    Packs/day: 1.00    Years: 30.00    Total pack years: 30.00    Types: Cigarettes    Quit date: 2018    Years since quitting: 5.5   Smokeless tobacco: Never   Tobacco comments:    Former smoker 10/28/2020  Vaping Use   Vaping Use: Never used  Substance and Sexual Activity   Alcohol use: Not Currently   Drug use: Never   Sexual activity: Not on file  Other Topics Concern   Not on file  Social History Narrative   ** Merged History Encounter **       Social Determinants of Health   Financial Resource Strain: Not on file   Food Insecurity: Not on file  Transportation Needs: Not on file  Physical Activity: Not on file  Stress: Not on file  Social Connections: Not on file    ECOG Status: 2 - Symptomatic, <50% confined to bed  Review of Systems: [name] 12 point ROS discussed and pertinent positives are indicated in the HPI above.  All other systems are negative.  Review of Systems  Vital Signs: There were no vitals taken for this visit.    Physical Exam Examined in wheelchair Constitutional: Oriented to person, place, and time. Well-developed and well-nourished.  He appears in mild distress.  He is courteous but curt with answers. HENT:  Head: Normocephalic and atraumatic.  Eyes: Conjunctivae and EOM are normal.  Right eye exhibits no discharge. Left eye exhibits no discharge. No scleral icterus.  Neck: No JVD present.  Pulmonary/Chest: Effort normal. No stridor. No respiratory distress.  Abdomen: soft, non distended.  He does have tenderness to palpation near the thoracolumbar junction and over the lumbar spine.  He has [name] lumbar brace in place. Neurological:  alert and oriented to person, place, and time.  Skin: Skin is warm and dry.  not diaphoretic.  Psychiatric:   normal mood and affect.   behavior is normal. Judgment and thought content normal.   Imaging: IR Radiologist Eval & Mgmt  Result Date: 09/03/2021 Please refer to notes tab for details about interventional procedure. (Op Note)  MR Lumbar Spine W Wo Contrast  Result Date: 08/20/2021 CLINICAL DATA:  Back pain, compression fractures on CT EXAM: MRI LUMBAR SPINE WITHOUT AND WITH CONTRAST TECHNIQUE: Multiplanar and multiecho pulse sequences of the lumbar spine were obtained without and with intravenous contrast. CONTRAST:  48mL GADAVIST GADOBUTROL 1 MMOL/ML IV SOLN COMPARISON:  Correlation made with CT abdomen pelvis 07/14/2021 FINDINGS: Segmentation:  Standard. Alignment: No significant listhesis. No significant endplate retropulsion associated  with compression fractures. Levocurvature. Vertebrae: Chronic compression fractures at L2, L4, and L5. New T12 compression fracture with approximately 50% loss of vertebral body height. Fracture lines are visible with STIR hyperintensity. There is mild marrow edema underlying the endplate. New L3 compression fracture with nearly 50% loss of height centrally. There is underlying mild marrow edema. There is enhancement at T12 and L3. Conus medullaris and cauda equina: Conus extends to the T12 level. Conus and cauda equina appear normal. Paraspinal and other soft tissues: Unremarkable. Disc levels: L1-L2: Disc bulge. Minor facet arthropathy. No significant canal or foraminal stenosis. L2-L3: Disc bulge. Minor facet arthropathy. Mild canal stenosis. Minor foraminal stenosis. L3-L4: Disc bulge. Minor facet arthropathy. Moderate canal stenosis. Slight effacement of subarticular recesses. Minor left foraminal stenosis. L4-L5: Disc bulge. Mild facet arthropathy with ligamentum flavum infolding. Moderate marked canal stenosis. Slight effacement of subarticular recesses. No significant foraminal stenosis. L5-S1: Disc bulge. Mild right and moderate left facet arthropathy. No significant canal or foraminal stenosis. IMPRESSION: Acute to subacute T12 and L3 compression fractures with approximately 50% loss of vertebral body height. No definite evidence of underlying lesion. Given that there are other chronic compression fractures present, these are favored to be benign. Electronically Signed   By: Macy Mis M.D.   On: 08/20/2021 15:46   CT ABDOMEN PELVIS W CONTRAST  Result Date: 08/20/2021 CLINICAL DATA:  Metastatic disease, worsening right lower back pain. Rule out retroperitoneal bleed . EXAM: CT ABDOMEN AND PELVIS WITH CONTRAST TECHNIQUE: Multidetector CT imaging of the abdomen and pelvis was performed using the standard protocol following bolus administration of intravenous contrast. RADIATION DOSE REDUCTION: This  exam was performed according to the departmental dose-optimization program which includes automated exposure control, adjustment of the mA and/or kV according to patient size and/or use of iterative reconstruction technique. CONTRAST:  131mL OMNIPAQUE IOHEXOL 350 MG/ML SOLN COMPARISON:  None Available. FINDINGS: Lower chest: Chronic fibrosis/atelectasis of the right lung, not significantly changed. Hepatobiliary: No focal liver abnormality is seen. Rim calcified gallstone without gallbladder wall thickening, or biliary dilatation. Pancreas: Moderate fatty infiltration of the pancreas. Spleen: Normal in size without focal abnormality. Adrenals/Urinary Tract: Large right adrenal heterogeneous mass measuring at least 4.2 x 5.6 x 5.6 cm, not significantly changed. Kidneys are unremarkable without evidence of nephrolithiasis or hydronephrosis. Urinary bladder is unremarkable. Stomach/Bowel: Stomach is within normal limits. Appendix  appears normal. No evidence of bowel wall thickening, distention, or inflammatory changes. Vascular/Lymphatic: Aortic atherosclerosis. Unchanged infrarenal abdominal aortic aneurysm measuring up to 2.7 x 2.7 cm. Follow-up examination every 5 years is recommended. No enlarged abdominal or pelvic lymph nodes. Reproductive: Prostate is unremarkable. Other: Fat containing left inguinal hernia. No abdominopelvic ascites. Musculoskeletal: New compression deformity of the T12 vertebral body with approximately 50% anterior vertebral body height loss. There is also new superior endplate compression deformity of L3 vertebral body. Chronic superior endplate deformities of L2 and L5 are unchanged. IMPRESSION: 1. New compression deformity of T12 vertebral body with approximately 50% anterior vertebral body height loss. 2.  New superior endplate deformity of L3 vertebral body. 3. Stable appearance of the right adrenal mass measuring 4.2 x 5.6 cm. 4.  Additional chronic findings as above. Electronically  Signed   By: Keane Police D.O.   On: 08/20/2021 14:01   CT Angio Chest PE W and/or Wo Contrast  Result Date: 08/20/2021 CLINICAL DATA:  Lower back pain.  Increased shortness of breath. EXAM: CT ANGIOGRAPHY CHEST WITH CONTRAST TECHNIQUE: Multidetector CT imaging of the chest was performed using the standard protocol during bolus administration of intravenous contrast. Multiplanar CT image reconstructions and MIPs were obtained to evaluate the vascular anatomy. RADIATION DOSE REDUCTION: This exam was performed according to the departmental dose-optimization program which includes automated exposure control, adjustment of the mA and/or kV according to patient size and/or use of iterative reconstruction technique. CONTRAST:  126mL OMNIPAQUE IOHEXOL 350 MG/ML SOLN COMPARISON:  CT examination dated Jul 14, 2021 FINDINGS: Cardiovascular: Satisfactory opacification of the pulmonary arteries to the segmental level. No evidence of pulmonary embolism. Normal heart size. No pericardial effusion. Mediastinum/Nodes: No enlarged mediastinal, hilar, or axillary lymph nodes. Thyroid gland, trachea, and esophagus demonstrate no significant findings. Lungs/Pleura: Right perihilar fibrosis/atelectasis, unchanged from prior examination suggesting postradiation changes. Multiple small thin walled cysts. Diffuse ground-glass attenuation of the lung parenchyma with bilateral peripheral fibrosis consistent with nonspecific interstitial pneumonia, unchanged. No evidence of pneumothorax or large pleural effusion. Upper Abdomen: Cholelithiasis without evidence of acute cholecystitis. Moderate pancreatic atrophy. No acute abnormality. Musculoskeletal: Osteopenia and mild degenerate disc disease of the thoracic spine. No acute osseous abnormality. Review of the MIP images confirms the above findings. IMPRESSION: 1.  No evidence of pulmonary embolism. 2. Fibrosis/cystic changes and atelectasis of the right hilar region, most consistent with  postradiation changes, not significantly changed from prior examination. 3. 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 4.  No acute upper abdominal process. Aortic Atherosclerosis (ICD10-I70.0) and Emphysema (ICD10-J43.9). Electronically Signed   By: Keane Police D.O.   On: 08/20/2021 13:46   DG Chest Portable 1 View  Result Date: 08/20/2021 CLINICAL DATA:  Short of breath.  History of lung cancer EXAM: PORTABLE CHEST 1 VIEW COMPARISON:  08/02/2021.  Chest CT 07/14/2021 FINDINGS: Right perihilar and right lower lobe airspace disease with mild progression. Elevated right hemidiaphragm. No significant effusion or pneumothorax on the right Left lung remains clear.  Negative for heart failure. IMPRESSION: Mild progression of right lower lobe airspace disease. Electronically Signed   By: Franchot Gallo M.D.   On: 08/20/2021 12:03   VAS [place] LOWER EXTREMITY VENOUS (DVT)  Result Date: 08/05/2021  Lower Venous DVT Study Patient Name:  [facility]  Date of Exam:   08/05/2021 Medical Rec #: [id number]      Accession #:    [id number] Date of Birth: 03-22-1960  Patient Gender: M Patient Age:   31 years Exam Location:  [facility] Procedure:      VAS [place] LOWER EXTREMITY VENOUS (DVT) Referring Phys: PRANAV PATEL --------------------------------------------------------------------------------  Indications: Edema.  Risk Factors: Cancer. Comparison Study: No prior studies. Performing Technologist: Oliver Hum RVT  Examination Guidelines: [name] complete evaluation includes B-mode imaging, spectral Doppler, color Doppler, and power Doppler as needed of all accessible portions of each vessel. Bilateral testing is considered an integral part of [name] complete examination. Limited examinations for reoccurring indications may be performed as noted. The reflux portion of the exam is performed with the patient in reverse Trendelenburg.   +---------+---------------+---------+-----------+----------+--------------+ RIGHT    CompressibilityPhasicitySpontaneityPropertiesThrombus Aging +---------+---------------+---------+-----------+----------+--------------+ CFV      Full           Yes      Yes                                 +---------+---------------+---------+-----------+----------+--------------+ SFJ      Full                                                        +---------+---------------+---------+-----------+----------+--------------+ FV Prox  Full                                                        +---------+---------------+---------+-----------+----------+--------------+ FV Mid   Full                                                        +---------+---------------+---------+-----------+----------+--------------+ FV DistalFull                                                        +---------+---------------+---------+-----------+----------+--------------+ PFV      Full                                                        +---------+---------------+---------+-----------+----------+--------------+ POP      Full           Yes      Yes                                 +---------+---------------+---------+-----------+----------+--------------+ PTV      Full                                                        +---------+---------------+---------+-----------+----------+--------------+  PERO     Full                                                        +---------+---------------+---------+-----------+----------+--------------+   +----+---------------+---------+-----------+----------+--------------+ LEFTCompressibilityPhasicitySpontaneityPropertiesThrombus Aging +----+---------------+---------+-----------+----------+--------------+ CFV Full           Yes      Yes                                  +----+---------------+---------+-----------+----------+--------------+     Summary: RIGHT: - There is no evidence of deep vein thrombosis in the lower extremity.  - No cystic structure found in the popliteal fossa.  LEFT: - No evidence of common femoral vein obstruction.  *See table(s) above for measurements and observations. Electronically signed by Harold Barban MD on 08/05/2021 at 9:24:38 PM.    Final     Labs:  CBC: Recent Labs    08/23/21 1547 08/24/21 0422 08/26/21 0440 08/29/21 0412  WBC 7.2 6.7 5.9 6.6  HGB 11.7* 11.1* 10.6* 10.5*  HCT 35.3* 34.0* 31.8* 32.5*  PLT 191 183 179 222    COAGS: Recent Labs    10/05/20 2040 12/22/20 1003 08/20/21 1139  INR 1.1 2.2* 1.2  APTT  --  38*  --     BMP: Recent Labs    08/23/21 1547 08/24/21 0422 08/26/21 0440 08/29/21 0412  NA 135 138 136 138  K 4.4 4.0 3.8 4.2  CL 98 99 95* 99  CO2 29 31 31  32  GLUCOSE 164* 107* 113* 77  BUN 14 15 16 18   CALCIUM 8.5* 9.0 8.9 8.4*  CREATININE 0.89 0.95 0.90 0.87  GFRNONAA >60 >60 >60 >60    LIVER FUNCTION TESTS: Recent Labs    08/12/21 0844 08/23/21 1547 08/24/21 0422 08/29/21 0412  BILITOT 0.5 0.8 0.8 0.5  AST 11* 13* 15 13*  ALT 16 19 18 17   ALKPHOS 64 69 70 74  PROT 6.0* 6.4* 6.2* 6.5  ALBUMIN 3.5 3.0* 3.1* 3.1*    TUMOR MARKERS: No results for input(s): "AFPTM", "CEA", "CA199", "CHROMGRNA" in the last 8760 hours.  Assessment and Plan:  My impression is that this patient has subacute unhealed T12 and L3 compression fracture deformities which likely contribute or account for most of the low back pain.  Based on cross-sectional imaging, this would be anatomically approachable for percutaneous intervention.  No associated spinal stenosis or other contraindications.  No suggestion of metastatic disease as an etiology on imaging, although I would probably send core biopsies given his cancer history, as this may change treatment planning should he need directed radiotherapy to  this region.  Given the  lack of adequate symptom relief with time and [name] fairly aggressive pain medication regimen, and   limitations of activities of daily living, the patient  is clinically an appropriate candidate for consideration of vertebral augmentation. I discussed with the patient and his significant other the pathophysiology of vertebral compression fracture deformities; the stable nature of these which does not require emergent treatment; natural history which includes healing over some unpredictable number of months.  We discussed treatment options including watchful waiting, surgical fixation, and percutaneous kyphoplasty/vertebroplasty.  We discussed in detail the percutaneous kyphoplasty technique, anticipated benefits, time course to symptom resolution, possible risks and  side effects.  Pain from the right adrenal metastasis would not be improved with kyphoplasty.  We discussed his elevated risk of additional level fractures with or without vertebral augmentation.  We discussed the long-term need for continued bone building therapy managed by the patient's PCP.   They seemed to understand, and did ask appropriate questions. The patient is motivated to proceed with treatment ASAP.  Accordingly, we schedule percutaneous T12 and L3 kyphoplasty under moderate sedation at [facility] or [facility] as an outpatient at the patient's convenience, pending carrier approval if needed.  We will have to coordinate holding his Eliquis with his cardiologist.  Given his pulmonary and respiratory status, I think the hospital setting is more prudent than outpatient setting for the required moderate sedation.   Thank you for this interesting consult.  I greatly enjoyed meeting Javis Abboud and look forward to participating in their care.  [name] copy of this report was sent to the requesting provider on this date.  Electronically Signed: Rickard Rhymes 09/03/2021, 12:26 PM   I spent [name] total of  30  Minutes   in face to face in clinical consultation, greater than 50% of which was counseling/coordinating care for painful unhealed thoracolumbar compression fractures.

## 2021-09-04 ENCOUNTER — Other Ambulatory Visit (HOSPITAL_COMMUNITY): Payer: Self-pay | Admitting: Physician Assistant

## 2021-09-04 DIAGNOSIS — Z01818 Encounter for other preprocedural examination: Secondary | ICD-10-CM

## 2021-09-07 ENCOUNTER — Other Ambulatory Visit: Payer: Self-pay

## 2021-09-07 ENCOUNTER — Telehealth: Payer: 59 | Admitting: Hospice

## 2021-09-07 ENCOUNTER — Telehealth (HOSPITAL_COMMUNITY): Payer: Self-pay

## 2021-09-07 ENCOUNTER — Encounter: Payer: Self-pay | Admitting: Emergency Medicine

## 2021-09-07 ENCOUNTER — Ambulatory Visit (HOSPITAL_COMMUNITY)
Admission: RE | Admit: 2021-09-07 | Discharge: 2021-09-07 | Disposition: A | Discharge status: Home / Self Care | Payer: 59 | Source: Ambulatory Visit | Attending: Neuroradiology | Admitting: Neuroradiology

## 2021-09-07 ENCOUNTER — Telehealth: Payer: Managed Care, Other (non HMO) | Admitting: Hospice

## 2021-09-07 ENCOUNTER — Other Ambulatory Visit (HOSPITAL_COMMUNITY): Payer: Self-pay | Admitting: Neuroradiology

## 2021-09-07 DIAGNOSIS — M069 Rheumatoid arthritis, unspecified: Secondary | ICD-10-CM | POA: Diagnosis not present

## 2021-09-07 DIAGNOSIS — M4856XA Collapsed vertebra, not elsewhere classified, lumbar region, initial encounter for fracture: Secondary | ICD-10-CM | POA: Insufficient documentation

## 2021-09-07 DIAGNOSIS — C7971 Secondary malignant neoplasm of right adrenal gland: Secondary | ICD-10-CM | POA: Insufficient documentation

## 2021-09-07 DIAGNOSIS — Z01818 Encounter for other preprocedural examination: Secondary | ICD-10-CM

## 2021-09-07 DIAGNOSIS — S22080A Wedge compression fracture of T11-T12 vertebra, initial encounter for closed fracture: Secondary | ICD-10-CM

## 2021-09-07 DIAGNOSIS — Z87891 Personal history of nicotine dependence: Secondary | ICD-10-CM | POA: Insufficient documentation

## 2021-09-07 DIAGNOSIS — M4854XA Collapsed vertebra, not elsewhere classified, thoracic region, initial encounter for fracture: Secondary | ICD-10-CM | POA: Diagnosis present

## 2021-09-07 DIAGNOSIS — Z85118 Personal history of other malignant neoplasm of bronchus and lung: Secondary | ICD-10-CM | POA: Insufficient documentation

## 2021-09-07 HISTORY — PX: IR KYPHO THORACIC WITH BONE BIOPSY: IMG5518

## 2021-09-07 LAB — CBC
HCT: 34 % — ABNORMAL LOW (ref 39.0–52.0)
Hemoglobin: 11.1 g/dL — ABNORMAL LOW (ref 13.0–17.0)
MCH: 28.8 pg (ref 26.0–34.0)
MCHC: 32.6 g/dL (ref 30.0–36.0)
MCV: 88.1 fL (ref 80.0–100.0)
Platelets: 363 10*3/uL (ref 150–400)
RBC: 3.86 MIL/uL — ABNORMAL LOW (ref 4.22–5.81)
RDW: 18.1 % — ABNORMAL HIGH (ref 11.5–15.5)
WBC: 6.5 10*3/uL (ref 4.0–10.5)
nRBC: 0 % (ref 0.0–0.2)

## 2021-09-07 LAB — PROTIME-INR
INR: 1.1 (ref 0.8–1.2)
Prothrombin Time: 14 seconds (ref 11.4–15.2)

## 2021-09-07 MED ORDER — BUPIVACAINE HCL (PF) 0.5 % IJ SOLN
INTRAMUSCULAR | Status: AC
  Filled 2021-09-07: qty 60

## 2021-09-07 MED ORDER — FENTANYL CITRATE (PF) 100 MCG/2ML IJ SOLN
INTRAMUSCULAR | Status: AC
  Filled 2021-09-07: qty 2

## 2021-09-07 MED ORDER — LIDOCAINE HCL 1 % IJ SOLN
INTRAMUSCULAR | Status: AC
  Filled 2021-09-07: qty 40

## 2021-09-07 MED ORDER — MIDAZOLAM HCL 2 MG/2ML IJ SOLN
INTRAMUSCULAR | Status: AC
  Filled 2021-09-07: qty 2

## 2021-09-07 MED ORDER — HYDROMORPHONE HCL 1 MG/ML IJ SOLN
INTRAMUSCULAR | Status: AC
  Filled 2021-09-07: qty 1

## 2021-09-07 MED ORDER — CEFAZOLIN SODIUM-DEXTROSE 2-4 GM/100ML-% IV SOLN
2.0000 g | INTRAVENOUS | Status: AC
  Administered 2021-09-07: 2 g via INTRAVENOUS

## 2021-09-07 MED ORDER — FENTANYL CITRATE (PF) 100 MCG/2ML IJ SOLN
INTRAMUSCULAR | Status: AC | PRN
  Administered 2021-09-07: 25 ug via INTRAVENOUS
  Administered 2021-09-07: 50 ug via INTRAVENOUS
  Administered 2021-09-07: 25 ug via INTRAVENOUS

## 2021-09-07 MED ORDER — HYDROMORPHONE HCL 1 MG/ML IJ SOLN
INTRAMUSCULAR | Status: AC | PRN
  Administered 2021-09-07 (×2): .5 mg via INTRAVENOUS

## 2021-09-07 MED ORDER — MIDAZOLAM HCL 2 MG/2ML IJ SOLN
INTRAMUSCULAR | Status: AC | PRN
  Administered 2021-09-07: .5 mg via INTRAVENOUS
  Administered 2021-09-07 (×3): 1 mg via INTRAVENOUS
  Administered 2021-09-07: .5 mg via INTRAVENOUS

## 2021-09-07 MED ORDER — IOHEXOL 300 MG/ML  SOLN
100.0000 mL | Freq: Once | INTRAMUSCULAR | Status: AC | PRN
  Administered 2021-09-07: 30 mL

## 2021-09-07 MED ORDER — SODIUM CHLORIDE 0.9 % IV SOLN: INTRAVENOUS | Status: DC

## 2021-09-07 MED ORDER — CEFAZOLIN SODIUM-DEXTROSE 2-4 GM/100ML-% IV SOLN
INTRAVENOUS | Status: AC
  Filled 2021-09-07: qty 100

## 2021-09-07 NOTE — H&P (Signed)
Chief Complaint: T12 L3 compression fracture. Request is for T12 and L3 kyphoplasty  Referring Physician(s): Dr. Pearletha Alfred  Supervising Physician: Katherina Right Lyla Glassing  Patient Status: [facility] - Out-pt  History of Present Illness: John [name] is a 61 y.o. male Smoker. 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 Found to have a subacute T12, L3 compression fracture and an old healed L4 compression fracture after hearing a pop while bending over. The patient was seen for consultation in the Interventional Radiology Clinic on 7.20.23 with Dr. Henreitta Leber The patient was given several different treatment options and the patient decided to pursue T12, L3 kyphoplasty with biopsy.   Patient alert and laying in bed, calm. Endorses back pain X 1 month.  Denies any fevers, headache, chest pain, SOB, cough, abdominal pain, nausea, vomiting or bleeding.    Past Medical History:  Diagnosis Date   Atrial fibrillation with RVR ([place]) 05/12/2020   Dyspnea    Family history of adverse reaction to anesthesia    brother with seizures had episode under anesthesia.  Patient has seizures as well.   GERD (gastroesophageal reflux disease)    History of radiation therapy 04/24/2020-05/16/2020   IMRT to right lung     Dr [name]   PAF (paroxysmal atrial fibrillation) ([place])    CHADS2VSAC score 0   Rheumatoid aortitis    Sepsis ([place]) 10/06/2020   Squamous cell lung cancer [facility])     Past Surgical History:  Procedure Laterality Date   BRONCHIAL BRUSHINGS  03/27/2020   Procedure: BRONCHIAL BRUSHINGS;  Surgeon: [name], MD;  Location: [place];  Service: Cardiopulmonary;;   BUBBLE STUDY  10/13/2020   Procedure: BUBBLE STUDY;  Surgeon: Pixie Casino, MD;  Location: [place];  Service: Cardiovascular;;   CARDIOVERSION N/A 10/13/2020   Procedure: CARDIOVERSION;  Surgeon: Pixie Casino, MD;  Location: [facility] ENDOSCOPY;   Service: Cardiovascular;  Laterality: N/A;   CATARACT EXTRACTION  2016   at [place]  03/27/2020   Procedure: [place];  Surgeon: [name], MD;  Location: [place];  Service: Cardiopulmonary;;   HIP SURGERY Left    IR RADIOLOGIST EVAL & MGMT  09/03/2021   TEE WITHOUT CARDIOVERSION N/A 10/13/2020   Procedure: TRANSESOPHAGEAL ECHOCARDIOGRAM (TEE);  Surgeon: Pixie Casino, MD;  Location: [place];  Service: Cardiovascular;  Laterality: N/A;   VIDEO BRONCHOSCOPY WITH ENDOBRONCHIAL ULTRASOUND N/A 03/27/2020   Procedure: VIDEO BRONCHOSCOPY WITH ENDOBRONCHIAL ULTRASOUND;  Surgeon: [name], MD;  Location: [place];  Service: Cardiopulmonary;  Laterality: N/A;    Allergies: Albuterol and Atorvastatin  Medications: Prior to Admission medications   Medication Sig Start Date End Date Taking? Authorizing Provider  amiodarone (PACERONE) 200 MG tablet Take 1 tablet (200 mg total) by mouth daily. 08/24/21  Yes Fenton, Clint R, PA  benzonatate (TESSALON) 200 MG capsule Take 1 capsule (200 mg total) by mouth 3 (three) times daily as needed for cough. 07/23/21  Yes Martyn Ehrich, NP  diclofenac Sodium (VOLTAREN) 1 % GEL Apply 1 Application topically 2 (two) times daily as needed (pain).   Yes [provider]  ELIQUIS 5 MG TABS tablet TAKE 1 TABLET(5 MG) BY MOUTH TWICE DAILY 09/04/21  Yes Fenton, Clint R, PA  metoprolol succinate (TOPROL-XL) 50 MG 24 hr tablet Take 1 tablet (50 mg total) by mouth daily. Take with or immediately following a meal.  08/08/21 09/07/21 Yes Pahwani, Einar Grad, MD  omeprazole (PRILOSEC OTC) 20 MG tablet Take 20 mg by mouth at bedtime.   Yes [provider]  Oxycodone HCl 10 MG TABS Take 1 tablet (10 mg total) by mouth every 4 (four) hours as needed for up to 7 days. 09/03/21 09/10/21 Yes Ennever, Rudell Cobb, MD  PARoxetine (PAXIL) 10 MG tablet Take 10 mg by mouth at bedtime. 01/25/20  Yes [provider]   polyethylene glycol (MIRALAX / GLYCOLAX) 17 g packet Take 17 g by mouth daily as needed for severe constipation. 08/29/21  Yes Charlynne Cousins, MD  predniSONE (DELTASONE) 10 MG tablet Take 10 mg by mouth daily with breakfast. 09/02/21  Yes [provider]  acetaminophen (TYLENOL) 500 MG tablet Take 1,000 mg by mouth every 6 (six) hours as needed for mild pain.    [provider]  prochlorperazine (COMPAZINE) 10 MG tablet Take 1 tablet (10 mg total) by mouth every 6 (six) hours as needed (Nausea or vomiting). Patient not taking: Reported on 06/29/2021 01/29/21 07/20/21  Volanda Napoleon, MD     Family History  Problem Relation Age of Onset   Arrhythmia Mother        has PPM   Heart disease Father        Died at 61, started in his 51s, heart attacks, had PPM and ICD    Social History   Socioeconomic History   Marital status: Married    Spouse name: Not on file   Number of children: Not on file   Years of education: Not on file   Highest education level: Not on file  Occupational History   Not on file  Tobacco Use   Smoking status: Former    Packs/day: 1.00    Years: 30.00    Total pack years: 30.00    Types: Cigarettes    Quit date: 2018    Years since quitting: 5.5   Smokeless tobacco: Never   Tobacco comments:    Former smoker 10/28/2020  Vaping Use   Vaping Use: Never used  Substance and Sexual Activity   Alcohol use: Not Currently   Drug use: Never   Sexual activity: Not on file  Other Topics Concern   Not on file  Social History Narrative   ** Merged History Encounter **       Social Determinants of Health   Financial Resource Strain: Not on file  Food Insecurity: Not on file  Transportation Needs: Not on file  Physical Activity: Not on file  Stress: Not on file  Social Connections: Not on file    Review of Systems: A 12 point ROS discussed and pertinent positives are indicated in the HPI above.  All other systems are  negative.  Review of Systems  Constitutional:  Negative for fever.  HENT:  Negative for congestion.   Respiratory:  Negative for cough and shortness of breath.   Cardiovascular:  Negative for chest pain.  Gastrointestinal:  Negative for abdominal pain.  Musculoskeletal:  Positive for back pain.  Neurological:  Negative for headaches.  Psychiatric/Behavioral:  Negative for behavioral problems and confusion.     Vital Signs: BP 124/85   Pulse 94   Temp 97.8 F (36.6 C) (Temporal)   Resp 18   Ht 5\' 11"  (1.803 m)   Wt 230 lb (104.3 kg)   SpO2 (!) 89%   BMI 32.08 kg/m     Physical Exam Vitals and nursing note reviewed.  Constitutional:  Appearance: He is well-developed.  HENT:     Head: Normocephalic.  Cardiovascular:     Rate and Rhythm: Normal rate.  Pulmonary:     Effort: Pulmonary effort is normal.     Breath sounds: Wheezing (LUQ) present.  Musculoskeletal:        General: Normal range of motion.     Cervical back: Normal range of motion.  Skin:    General: Skin is dry.  Neurological:     Mental Status: He is alert and oriented to person, place, and time.     Imaging: IR Radiologist Eval & Mgmt  Result Date: 09/03/2021 Please refer to notes tab for details about interventional procedure. (Op Note)  MR Lumbar Spine W Wo Contrast  Result Date: 08/20/2021 CLINICAL DATA:  Back pain, compression fractures on CT EXAM: MRI LUMBAR SPINE WITHOUT AND WITH CONTRAST TECHNIQUE: Multiplanar and multiecho pulse sequences of the lumbar spine were obtained without and with intravenous contrast. CONTRAST:  55mL GADAVIST GADOBUTROL 1 MMOL/ML IV SOLN COMPARISON:  Correlation made with CT abdomen pelvis 07/14/2021 FINDINGS: Segmentation:  Standard. Alignment: No significant listhesis. No significant endplate retropulsion associated with compression fractures. Levocurvature. Vertebrae: Chronic compression fractures at L2, L4, and L5. New T12 compression fracture with approximately  50% loss of vertebral body height. Fracture lines are visible with STIR hyperintensity. There is mild marrow edema underlying the endplate. New L3 compression fracture with nearly 50% loss of height centrally. There is underlying mild marrow edema. There is enhancement at T12 and L3. Conus medullaris and cauda equina: Conus extends to the T12 level. Conus and cauda equina appear normal. Paraspinal and other soft tissues: Unremarkable. Disc levels: L1-L2: Disc bulge. Minor facet arthropathy. No significant canal or foraminal stenosis. L2-L3: Disc bulge. Minor facet arthropathy. Mild canal stenosis. Minor foraminal stenosis. L3-L4: Disc bulge. Minor facet arthropathy. Moderate canal stenosis. Slight effacement of subarticular recesses. Minor left foraminal stenosis. L4-L5: Disc bulge. Mild facet arthropathy with ligamentum flavum infolding. Moderate marked canal stenosis. Slight effacement of subarticular recesses. No significant foraminal stenosis. L5-S1: Disc bulge. Mild right and moderate left facet arthropathy. No significant canal or foraminal stenosis. IMPRESSION: Acute to subacute T12 and L3 compression fractures with approximately 50% loss of vertebral body height. No definite evidence of underlying lesion. Given that there are other chronic compression fractures present, these are favored to be benign. Electronically Signed   By: Macy Mis M.D.   On: 08/20/2021 15:46   CT ABDOMEN PELVIS W CONTRAST  Result Date: 08/20/2021 CLINICAL DATA:  Metastatic disease, worsening right lower back pain. Rule out retroperitoneal bleed . EXAM: CT ABDOMEN AND PELVIS WITH CONTRAST TECHNIQUE: Multidetector CT imaging of the abdomen and pelvis was performed using the standard protocol following bolus administration of intravenous contrast. RADIATION DOSE REDUCTION: This exam was performed according to the departmental dose-optimization program which includes automated exposure control, adjustment of the mA and/or kV  according to patient size and/or use of iterative reconstruction technique. CONTRAST:  19mL OMNIPAQUE IOHEXOL 350 MG/ML SOLN COMPARISON:  None Available. FINDINGS: Lower chest: Chronic fibrosis/atelectasis of the right lung, not significantly changed. Hepatobiliary: No focal liver abnormality is seen. Rim calcified gallstone without gallbladder wall thickening, or biliary dilatation. Pancreas: Moderate fatty infiltration of the pancreas. Spleen: Normal in size without focal abnormality. Adrenals/Urinary Tract: Large right adrenal heterogeneous mass measuring at least 4.2 x 5.6 x 5.6 cm, not significantly changed. Kidneys are unremarkable without evidence of nephrolithiasis or hydronephrosis. Urinary bladder is unremarkable. Stomach/Bowel: Stomach is within normal  limits. Appendix appears normal. No evidence of bowel wall thickening, distention, or inflammatory changes. Vascular/Lymphatic: Aortic atherosclerosis. Unchanged infrarenal abdominal aortic aneurysm measuring up to 2.7 x 2.7 cm. Follow-up examination every 5 years is recommended. No enlarged abdominal or pelvic lymph nodes. Reproductive: Prostate is unremarkable. Other: Fat containing left inguinal hernia. No abdominopelvic ascites. Musculoskeletal: New compression deformity of the T12 vertebral body with approximately 50% anterior vertebral body height loss. There is also new superior endplate compression deformity of L3 vertebral body. Chronic superior endplate deformities of L2 and L5 are unchanged. IMPRESSION: 1. New compression deformity of T12 vertebral body with approximately 50% anterior vertebral body height loss. 2.  New superior endplate deformity of L3 vertebral body. 3. Stable appearance of the right adrenal mass measuring 4.2 x 5.6 cm. 4.  Additional chronic findings as above. Electronically Signed   By: Keane Police D.O.   On: 08/20/2021 14:01   CT Angio Chest PE W and/or Wo Contrast  Result Date: 08/20/2021 CLINICAL DATA:  Lower back  pain.  Increased shortness of breath. EXAM: CT ANGIOGRAPHY CHEST WITH CONTRAST TECHNIQUE: Multidetector CT imaging of the chest was performed using the standard protocol during bolus administration of intravenous contrast. Multiplanar CT image reconstructions and MIPs were obtained to evaluate the vascular anatomy. RADIATION DOSE REDUCTION: This exam was performed according to the departmental dose-optimization program which includes automated exposure control, adjustment of the mA and/or kV according to patient size and/or use of iterative reconstruction technique. CONTRAST:  165mL OMNIPAQUE IOHEXOL 350 MG/ML SOLN COMPARISON:  CT examination dated Jul 14, 2021 FINDINGS: Cardiovascular: Satisfactory opacification of the pulmonary arteries to the segmental level. No evidence of pulmonary embolism. Normal heart size. No pericardial effusion. Mediastinum/Nodes: No enlarged mediastinal, hilar, or axillary lymph nodes. Thyroid gland, trachea, and esophagus demonstrate no significant findings. Lungs/Pleura: Right perihilar fibrosis/atelectasis, unchanged from prior examination suggesting postradiation changes. Multiple small thin walled cysts. Diffuse ground-glass attenuation of the lung parenchyma with bilateral peripheral fibrosis consistent with nonspecific interstitial pneumonia, unchanged. No evidence of pneumothorax or large pleural effusion. Upper Abdomen: Cholelithiasis without evidence of acute cholecystitis. Moderate pancreatic atrophy. No acute abnormality. Musculoskeletal: Osteopenia and mild degenerate disc disease of the thoracic spine. No acute osseous abnormality. Review of the MIP images confirms the above findings. IMPRESSION: 1.  No evidence of pulmonary embolism. 2. Fibrosis/cystic changes and atelectasis of the right hilar region, most consistent with postradiation changes, not significantly changed from prior examination. 3. Mosaic attenuation of the lung parenchyma with bilateral peripheral and  basilar fibrotic changes suggesting NSIP, unchanged. 4.  No acute upper abdominal process. Aortic Atherosclerosis (ICD10-I70.0) and Emphysema (ICD10-J43.9). Electronically Signed   By: Keane Police D.O.   On: 08/20/2021 13:46   DG Chest Portable 1 View  Result Date: 08/20/2021 CLINICAL DATA:  Short of breath.  History of lung cancer EXAM: PORTABLE CHEST 1 VIEW COMPARISON:  08/02/2021.  Chest CT 07/14/2021 FINDINGS: Right perihilar and right lower lobe airspace disease with mild progression. Elevated right hemidiaphragm. No significant effusion or pneumothorax on the right Left lung remains clear.  Negative for heart failure. IMPRESSION: Mild progression of right lower lobe airspace disease. Electronically Signed   By: Franchot Gallo M.D.   On: 08/20/2021 12:03    Labs:  CBC: Recent Labs    08/23/21 1547 08/24/21 0422 08/26/21 0440 08/29/21 0412  WBC 7.2 6.7 5.9 6.6  HGB 11.7* 11.1* 10.6* 10.5*  HCT 35.3* 34.0* 31.8* 32.5*  PLT 191 183 179 222    COAGS:  Recent Labs    10/05/20 2040 12/22/20 1003 08/20/21 1139  INR 1.1 2.2* 1.2  APTT  --  38*  --     BMP: Recent Labs    08/23/21 1547 08/24/21 0422 08/26/21 0440 08/29/21 0412  NA 135 138 136 138  K 4.4 4.0 3.8 4.2  CL 98 99 95* 99  CO2 29 31 31  32  GLUCOSE 164* 107* 113* 77  BUN 14 15 16 18   CALCIUM 8.5* 9.0 8.9 8.4*  CREATININE 0.89 0.95 0.90 0.87  GFRNONAA >60 >60 >60 >60    LIVER FUNCTION TESTS: Recent Labs    08/12/21 0844 08/23/21 1547 08/24/21 0422 08/29/21 0412  BILITOT 0.5 0.8 0.8 0.5  AST 11* 13* 15 13*  ALT 16 19 18 17   ALKPHOS 64 69 70 74  PROT 6.0* 6.4* 6.2* 6.5  ALBUMIN 3.5 3.0* 3.1* 3.1*      Assessment and Plan:  61 y.o. male outpatient. Smoker. 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 Found to have a subacute T12, L3 compression fracture and an old healed L4 compression fracture after hearing a pop while bending  over. The patient was seen for consultation in the Interventional Radiology Clinic on 7.20.23 with Dr. Henreitta Leber The patient was given several different treatment options and the patient decided to pursue T12, L3 kyphoplasty with biopsy.   MR Lumbar from 7.6.23 reads Acute to subacute T12 and L3 compression fractures with approximately 50% loss of vertebral body height. No definite evidence of underlying lesion. Given that there are other chronic compression fractures present, these are favored to be benign Labs pending. Patient is on eliquis. All other medications are within acceptable parameters. No pertinent allergies. Patient has been NPO since midnight.   Risks and benefits of T12, L3 kyphoplasty/vertebroplasty were discussed with the patient including, but not limited to education regarding the natural healing process of compression fractures without intervention, bleeding, infection, cement migration which may cause spinal cord damage, paralysis, pulmonary embolism or even death. This interventional procedure involves the use of X-rays and because of the nature of the planned procedure, it is possible that we will have prolonged use of X-ray fluoroscopy. Potential radiation risks to you include (but are not limited to) the following: - A slightly elevated risk for cancer  several years later in life. This risk is typically less than 0.5% percent. This risk is low in comparison to the normal incidence of human cancer, which is 33% for women and 50% for men according to the [employer]. - Radiation induced injury can include skin redness, resembling a rash, tissue breakdown / ulcers and hair loss (which can be temporary or permanent).  The likelihood of either of these occurring depends on the difficulty of the procedure and whether you are sensitive to radiation due to previous procedures, disease, or genetic conditions.  IF your procedure requires a prolonged use of radiation, you will  be notified and given written instructions for further action.  It is your responsibility to monitor the irradiated area for the 2 weeks following the procedure and to notify your physician if you are concerned that you have suffered a radiation induced injury.   All of the patient's questions were answered, patient is agreeable to proceed. Consent signed and in chart.   All other labs and medications are within acceptable parameters. NKDA. Patient has been NPO since midnight.   Thank  you for this interesting consult.  I greatly enjoyed meeting John [name] and look forward to participating in their care.  A copy of this report was sent to the requesting provider on this date.  Electronically Signed: Jacqualine Mau, NP 09/07/2021, 7:43 AM   I spent a total of  30 Minutes   in face to face in clinical consultation, greater than 50% of which was counseling/coordinating care for T12 and L3 kyphoplasty

## 2021-09-07 NOTE — Telephone Encounter (Signed)
Called to reschedule kp w/anesthesia, no answer, left vm. AW

## 2021-09-07 NOTE — Telephone Encounter (Signed)
Left message for John [name] to please fax over a clearance request to fax # [phone]. Once this has been received, will be sure to get the information to our preop provider for assessment.

## 2021-09-07 NOTE — Discharge Instructions (Addendum)
KYPHOPLASTY/VERTEBROPLASTY DISCHARGE INSTRUCTIONS  Medications: (check all that apply)     Resume all home medications as before procedure.                              Resume Eliquis tomorrow  Continue your pain medications as prescribed as needed.  Over the next 3-5 days, decrease your pain medication as tolerated.  Over the counter medications (i.e. Tylenol, ibuprofen, and aleve) may be substituted once severe/moderate pain symptoms have subsided.   Wound Care: Bandages may be removed the day following your procedure.  You may get your incision wet once bandages are removed.  Bandaids may be used to cover the incisions until scab formation.  Topical ointments are optional.  If you develop a fever greater than 101 degrees, have increased skin redness at the incision sites or pus-like oozing from incisions occurring within 1 week of the procedure, contact radiology at [phone] or [phone].  Ice pack to back for 15-20 minutes 2-3 time per day for first 2-3 days post procedure.  The ice will expedite muscle healing and help with the pain from the incisions.   Activity: Bedrest today with limited activity for 24 hours post procedure.  No driving for 48 hours.  Increase your activity as tolerated after bedrest (with assistance if necessary).  Refrain from any strenuous activity or heavy lifting (greater than 10 lbs.).   Follow up: Contact radiology at [phone] or [phone] if any questions/concerns.  A physician assistant from radiology will contact you in approximately 1 week.  If a biopsy was performed at the time of your procedure, your referring physician should receive the results in usually 2-3 days.

## 2021-09-07 NOTE — Procedures (Signed)
INTERVENTIONAL NEURORADIOLOGY BRIEF POSTPROCEDURE NOTE  FLUOROSCOPY GUIDED T12 CORE BONE BIOPSY AND BALLOON KYPHOPLASTY  Attending: Dr. Pedro Earls  Diagnosis: T12 and L3 subacute compression fractures  Access site: Percutaneous bilateral transpedicular  Anesthesia: Moderate sedation  Medication used: 4 mg Versed IV; 100 mcg Fentanyl IV; 1 mg dilaudid.  Complications: None.  Estimated blood loss: None.  Specimen: T12 core bone biopsy.  Findings: T12, L1 and L3 compression fractures noted. The L1 fracture is new since prior MRI.   Bilateral transpedicular approach utilized for T12 core biopsy and balloon kyphoplasty.   The patient showed minimal response to sedation and pain medication, complaining of pain throughout the procedure and was unable to stay still, particularly towards the end of the procedure. It was felt to be unsafe to proceed with the L3 kyphoplasty.  The patient was sent to short stay in stable condition.

## 2021-09-07 NOTE — Progress Notes (Signed)
Per Rushie Nyhan, NP client may  resume eliquis tomorrow

## 2021-09-08 ENCOUNTER — Other Ambulatory Visit (HOSPITAL_COMMUNITY): Payer: Self-pay | Admitting: Neuroradiology

## 2021-09-08 ENCOUNTER — Ambulatory Visit: Payer: 59

## 2021-09-08 ENCOUNTER — Telehealth: Payer: Self-pay | Admitting: Emergency Medicine

## 2021-09-08 ENCOUNTER — Telehealth: Payer: Self-pay | Admitting: Oncology

## 2021-09-08 DIAGNOSIS — S32010A Wedge compression fracture of first lumbar vertebra, initial encounter for closed fracture: Secondary | ICD-10-CM

## 2021-09-08 DIAGNOSIS — M545 Low back pain, unspecified: Secondary | ICD-10-CM

## 2021-09-08 DIAGNOSIS — S32030A Wedge compression fracture of third lumbar vertebra, initial encounter for closed fracture: Secondary | ICD-10-CM

## 2021-09-08 MED ORDER — HYDROCODONE BIT-HOMATROP MBR 5-1.5 MG/5ML PO SOLN: 5.0000 mL | Freq: Four times a day (QID) | ORAL | 0 refills | Status: DC | PRN

## 2021-09-08 MED ORDER — DOXYCYCLINE HYCLATE 100 MG PO TABS: 100.0000 mg | ORAL_TABLET | Freq: Two times a day (BID) | ORAL | 0 refills | Status: DC

## 2021-09-08 NOTE — Telephone Encounter (Signed)
Amiodarone and Azithromycin carry a high risk for interaction of QT prolongation. Dr. [name], please advise if ok to send in zpak or something different. Thanks.

## 2021-09-08 NOTE — Telephone Encounter (Signed)
Called [facility] and spoke with pharmacy and they state that since patient is already on oxycodone that they are advising patient not be on both oxycodone and the hycodone cough syrup.  Please advise

## 2021-09-08 NOTE — Telephone Encounter (Signed)
I s/w the pt and explained we were needing [facility] to fax over a clearance request for the procedure being requested. I stated to him that Jocelyn Lamer with [facility] called 7//20/23 asking permission to hold Eliquis x 2 days for Kyphoplasty. Pt tells me that they did his procedure yesterday but they have to do another one. He said they had him hold his Eliquis x 3 days. I advised the pt the clearance and amount of time to safely hold his blood thinner needs to come from his cardiology team. Pt is grateful for the help and the care that we are giving him and he did not realize that he needed to make sure to get the ok from the cardiology team. I informed the anytime he comes off his blood thinner, it puts him at a  higher risk for a stroke or blood clots. Pt thanked me again for the help and the information. I assured the pt that I will try to reach Jocelyn Lamer at [facility] again tomorrow and request that a clearance be faxed to our office for the cardiologist to review and for recommendations on holding Eliquis. Pt said thank you.

## 2021-09-08 NOTE — Telephone Encounter (Signed)
I think we should try some stronger cough suppressant - I will send script for hycodan Also would have him take azithro - Z pack to see if this is beneficial

## 2021-09-08 NOTE — Telephone Encounter (Signed)
Called John [name] to see how he is feeling after the kyphoplasty yesterday. [name]  He said he is sore and was only able to have one area done.  He is still in pain and doesn't think he can tolerate radiation.  He is going to have another kyphoplasty and wants to wait until after that is done to resume radiation.

## 2021-09-08 NOTE — Telephone Encounter (Signed)
Per Dr.Byrum - Change Azithro to Doxy 100mg  BID x 7 days.

## 2021-09-08 NOTE — Progress Notes (Unsigned)
Pharmacist Chemotherapy Monitoring - Initial Assessment    Anticipated start date: 09/14/21   The following has been reviewed per standard work regarding the patient's treatment regimen: The patient's diagnosis, treatment plan and drug doses, and organ/hematologic function Lab orders and baseline tests specific to treatment regimen  The treatment plan start date, drug sequencing, and pre-medications Prior authorization status  Patient's documented medication list, including drug-drug interaction screen and prescriptions for anti-emetics and supportive care specific to the treatment regimen The drug concentrations, fluid compatibility, administration routes, and timing of the medications to be used The patient's access for treatment and lifetime cumulative dose history, if applicable  The patient's medication allergies and previous infusion related reactions, if applicable   Changes made to treatment plan:  treatment plan date  Follow up needed:  N/A   John [name], John [name], [name], 09/08/2021  7:31 AM

## 2021-09-08 NOTE — Telephone Encounter (Signed)
Called and spoke to pt. Offered pt an appointment for today or tomorrow (7/24, 7/25). Pt declined as he just had a back procedure yesterday (per pt's chart: FLUOROSCOPY GUIDED T12 CORE BONE BIOPSY AND BALLOON KYPHOPLASTY). Pt states his cough is unchanged since last time he was seen in early June. Pt was advised to return on 7/14 to see RB but pt was hospitalized (dx: T12 compression fx) and could not make appt. Pt states the more he coughs the worse his back gets. Pt states the Tessalon no longer helps with his cough. Pt states the cough is nonproductive until he coughs up green mucus which can take a couple hours. Pt denies ShOB, f/c/s, chest congestion, wheezing. Pt is taking Oxycodone 10mg  q4h prn for back pain, he states this medication has not helped with his cough.   Dr. [name], please advise. Thanks.

## 2021-09-09 ENCOUNTER — Ambulatory Visit: Payer: 59

## 2021-09-09 LAB — SURGICAL PATHOLOGY

## 2021-09-09 NOTE — Telephone Encounter (Signed)
   Pre-operative Risk Assessment    Patient Name: John [name]  DOB: 02/23/1960 MRN: [id number]      Request for Surgical Clearance    Procedure:   KYPHOPLASTY  Date of Surgery:  Clearance TBD                               Surgeon:  NOT LISTED Surgeon's Group or Practice Name:  [facility] Phone number:  [phone] Fax number:  [phone] ATTN: Jocelyn Lamer   Type of Clearance Requested:   - Medical  - Pharmacy:  Hold Apixaban (Eliquis) x 2 DAYS PRIOR TO PROCEDURE   Type of Anesthesia:  Not Indicated   Additional requests/questions:    Jiles Prows   09/09/2021, 1:31 PM

## 2021-09-09 NOTE — Telephone Encounter (Signed)
I tried to reach John [name] again but  was unable to reach her. I left a message with the other surgery John [name] with a message if she could please reach out to John [name] and have her please fax over a clearance request, fax # [phone].

## 2021-09-09 NOTE — Telephone Encounter (Signed)
I'm advising that he can have both available

## 2021-09-10 ENCOUNTER — Ambulatory Visit: Payer: 59

## 2021-09-10 NOTE — Telephone Encounter (Signed)
Spoke with [facility] and reviewed Dr. Agustina Caroli dictated response. [facility] stated understanding and medication is current ready for pt pickup. Nothing further needed at this time.

## 2021-09-11 NOTE — Telephone Encounter (Signed)
   Name: John [name]  DOB: 1960/02/17  MRN: [id number]  Primary Cardiologist: Fransico Him, MD  Preoperative team, please contact this patient and set up a phone call appointment for further preoperative risk assessment. Please obtain consent and complete medication review. Patient does have an appointment scheduled to see Richardson Dopp, PA, on 09/29/2021.  If patient prefers in-person visit prior to surgical clearance, he may proceed with office visit and there will be no need for a telephone visit.  Thank you for your help.  I confirm that guidance regarding antiplatelet and oral anticoagulation therapy has been completed and, if necessary, noted below.  Patient with diagnosis of atrial fibrillation on Eliquis for anticoagulation.     Procedure: kypoplasty Date of procedure: TBD     CHA2DS2-VASc Score = 3   This indicates a 3.2% annual risk of stroke. The patient's score is based upon: CHF History: 0 HTN History: 0 Diabetes History: 0 Stroke History: 2 (possible spenic embolic event) Vascular Disease History: 1 Age Score: 0 Gender Score: 0   CrCl 111 (with adjusted body weight) Platelet count 363   Will review with primary cardiologist to determine if bridge needed.  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 Radford Pax, patient may hold Eliquis for 3 days prior to procedure.   Lenna Sciara, NP 09/11/2021, 12:41 PM [facility] [address] [place], [place] [address]

## 2021-09-11 NOTE — Telephone Encounter (Signed)
1st attempt to reach pt regarding surgical clearance.  Left a message for pt to call back and ask for the preop team.

## 2021-09-11 NOTE — Telephone Encounter (Signed)
Pt is returning call. Transferred to [employer], [place].

## 2021-09-11 NOTE — Telephone Encounter (Signed)
Patient with diagnosis of atrial fibrillation on Eliquis for anticoagulation.    Procedure: kypoplasty Date of procedure: TBD   CHA2DS2-VASc Score = 3   This indicates a 3.2% annual risk of stroke. The patient's score is based upon: CHF History: 0 HTN History: 0 Diabetes History: 0 Stroke History: 2 (possible spenic embolic event) Vascular Disease History: 1 Age Score: 0 Gender Score: 0   CrCl 111 (with adjusted body weight) Platelet count 363  Will review with primary cardiologist to determine if bridge needed.  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 Radford Pax, patient will need to hold Eliquis for 3 days   **This guidance is not considered finalized until pre-operative APP has relayed final recommendations.**

## 2021-09-11 NOTE — Telephone Encounter (Signed)
Pt is scheduled to see Richardson Dopp, PA-C, 09/29/21, clearance will be addressed at that time.  Will route to requesting surgeon's office to make them aware.

## 2021-09-14 ENCOUNTER — Ambulatory Visit: Payer: 59 | Admitting: Adult Health

## 2021-09-14 ENCOUNTER — Ambulatory Visit: Payer: 59

## 2021-09-14 ENCOUNTER — Encounter: Payer: Self-pay | Admitting: Hematology & Oncology

## 2021-09-14 ENCOUNTER — Inpatient Hospital Stay (HOSPITAL_BASED_OUTPATIENT_CLINIC_OR_DEPARTMENT_OTHER): Payer: 59 | Admitting: Hematology & Oncology

## 2021-09-14 ENCOUNTER — Telehealth: Payer: Managed Care, Other (non HMO) | Admitting: Hospice

## 2021-09-14 ENCOUNTER — Inpatient Hospital Stay: Payer: 59

## 2021-09-14 ENCOUNTER — Other Ambulatory Visit: Payer: Self-pay

## 2021-09-14 ENCOUNTER — Inpatient Hospital Stay: Payer: 59 | Attending: Internal Medicine

## 2021-09-14 VITALS — BP 105/72 | HR 112 | Temp 97.8°F | Resp 20 | Ht 71.0 in | Wt 230.5 lb

## 2021-09-14 DIAGNOSIS — C3411 Malignant neoplasm of upper lobe, right bronchus or lung: Secondary | ICD-10-CM | POA: Diagnosis present

## 2021-09-14 DIAGNOSIS — C3410 Malignant neoplasm of upper lobe, unspecified bronchus or lung: Secondary | ICD-10-CM

## 2021-09-14 DIAGNOSIS — C7971 Secondary malignant neoplasm of right adrenal gland: Secondary | ICD-10-CM | POA: Diagnosis not present

## 2021-09-14 DIAGNOSIS — S22080S Wedge compression fracture of T11-T12 vertebra, sequela: Secondary | ICD-10-CM

## 2021-09-14 DIAGNOSIS — C3401 Malignant neoplasm of right main bronchus: Secondary | ICD-10-CM

## 2021-09-14 DIAGNOSIS — C779 Secondary and unspecified malignant neoplasm of lymph node, unspecified: Secondary | ICD-10-CM | POA: Insufficient documentation

## 2021-09-14 DIAGNOSIS — I480 Paroxysmal atrial fibrillation: Secondary | ICD-10-CM

## 2021-09-14 DIAGNOSIS — C7970 Secondary malignant neoplasm of unspecified adrenal gland: Secondary | ICD-10-CM

## 2021-09-14 DIAGNOSIS — Z7901 Long term (current) use of anticoagulants: Secondary | ICD-10-CM | POA: Insufficient documentation

## 2021-09-14 DIAGNOSIS — Z515 Encounter for palliative care: Secondary | ICD-10-CM

## 2021-09-14 DIAGNOSIS — F419 Anxiety disorder, unspecified: Secondary | ICD-10-CM

## 2021-09-14 DIAGNOSIS — I4891 Unspecified atrial fibrillation: Secondary | ICD-10-CM | POA: Insufficient documentation

## 2021-09-14 DIAGNOSIS — S22080A Wedge compression fracture of T11-T12 vertebra, initial encounter for closed fracture: Secondary | ICD-10-CM

## 2021-09-14 LAB — CMP (CANCER CENTER ONLY)
ALT: 9 U/L (ref 0–44)
AST: 13 U/L — ABNORMAL LOW (ref 15–41)
Albumin: 3.7 g/dL (ref 3.5–5.0)
Alkaline Phosphatase: 100 U/L (ref 38–126)
Anion gap: 9 (ref 5–15)
BUN: 8 mg/dL (ref 6–20)
CO2: 27 mmol/L (ref 22–32)
Calcium: 9.3 mg/dL (ref 8.9–10.3)
Chloride: 96 mmol/L — ABNORMAL LOW (ref 98–111)
Creatinine: 0.86 mg/dL (ref 0.61–1.24)
GFR, Estimated: >60 mL/min (ref >60)
Glucose, Bld: 120 mg/dL — ABNORMAL HIGH (ref 70–99)
Potassium: 4.3 mmol/L (ref 3.5–5.1)
Sodium: 132 mmol/L — ABNORMAL LOW (ref 135–145)
Total Bilirubin: 0.7 mg/dL (ref 0.3–1.2)
Total Protein: 7.1 g/dL (ref 6.5–8.1)

## 2021-09-14 LAB — CBC WITH DIFFERENTIAL (CANCER CENTER ONLY)
Abs Immature Granulocytes: 0.11 10*3/uL — ABNORMAL HIGH (ref 0.00–0.07)
Basophils Absolute: 0.1 10*3/uL (ref 0.0–0.1)
Basophils Relative: 1 %
Eosinophils Absolute: 0.5 10*3/uL (ref 0.0–0.5)
Eosinophils Relative: 5 %
HCT: 33.9 % — ABNORMAL LOW (ref 39.0–52.0)
Hemoglobin: 10.9 g/dL — ABNORMAL LOW (ref 13.0–17.0)
Immature Granulocytes: 1 %
Lymphocytes Relative: 7 %
Lymphs Abs: 0.7 10*3/uL (ref 0.7–4.0)
MCH: 28.7 pg (ref 26.0–34.0)
MCHC: 32.2 g/dL (ref 30.0–36.0)
MCV: 89.2 fL (ref 80.0–100.0)
Monocytes Absolute: 1.4 10*3/uL — ABNORMAL HIGH (ref 0.1–1.0)
Monocytes Relative: 13 %
Neutro Abs: 7.4 10*3/uL (ref 1.7–7.7)
Neutrophils Relative %: 73 %
Platelet Count: 383 10*3/uL (ref 150–400)
RBC: 3.8 MIL/uL — ABNORMAL LOW (ref 4.22–5.81)
RDW: 17.2 % — ABNORMAL HIGH (ref 11.5–15.5)
WBC Count: 10.1 10*3/uL (ref 4.0–10.5)
nRBC: 0 % (ref 0.0–0.2)

## 2021-09-14 LAB — SAVE SMEAR(SSMR), FOR PROVIDER SLIDE REVIEW

## 2021-09-14 LAB — LACTATE DEHYDROGENASE: LDH: 296 U/L — ABNORMAL HIGH (ref 98–192)

## 2021-09-14 LAB — PREALBUMIN: Prealbumin: 11 mg/dL — ABNORMAL LOW (ref 18–38)

## 2021-09-14 LAB — SAMPLE TO BLOOD BANK

## 2021-09-14 MED ORDER — MORPHINE SULFATE ER 15 MG PO TBCR: 15.0000 mg | EXTENDED_RELEASE_TABLET | Freq: Two times a day (BID) | ORAL | 0 refills | Status: DC

## 2021-09-14 MED ORDER — ERGOCALCIFEROL 1.25 MG (50000 UT) PO CAPS: 50000.0000 [IU] | ORAL_CAPSULE | ORAL | 4 refills | Status: DC

## 2021-09-14 MED ORDER — OXYCODONE HCL 10 MG PO TABS: 5.0000 mg | ORAL_TABLET | Freq: Four times a day (QID) | ORAL | 0 refills | Status: DC | PRN

## 2021-09-14 NOTE — Progress Notes (Signed)
[facility] Consult Note Telephone: [phone]  Fax: [phone]  PATIENT NAME: John [name] [address] [place] [address] [phone] (home)  DOB: Aug 07, 1960 MRN: [id number]  PRIMARY CARE PROVIDER:    [name], MD,  [place] [address] [phone]  REFERRING PROVIDER:   [name], [place],  [place] [address] [phone]  RESPONSIBLE PARTY:    Contact Information     Name Relation Home Work [address]   John [name] Spouse [phone]  [phone]       TELEHEALTH VISIT STATEMENT Due to the COVID-19 crisis, this visit was done via telemedicine from my office and it was initiated and consent by this patient and or family.  I connected with patient OR PROXY by a telephone/video  and verified that I am speaking with the correct person. I discussed the limitations of evaluation and management by telemedicine. The patient expressed understanding and agreed to proceed. Palliative Care was asked to follow this patient to address advance care planning, complex medical decision making and goals of care clarification. John [name] is with patient during visit. This is the initial visit.     ASSESSMENT AND / RECOMMENDATIONS:   Advance Care Planning: Our advance care planning conversation included a discussion about:    The value and importance of advance care planning  Difference between Hospice and Palliative care Exploration of goals of care in the event of a sudden injury or illness  Identification and preparation of a healthcare agent  Review and updating or creation of an  advance directive document . Decision not to resuscitate or to de-escalate disease focused treatments due to poor prognosis.  CODE STATUS:Patient is a Full code  Goals of Care: Goals include to maximize quality of life and symptom management  I spent 16  minutes providing this initial  consultation. More than 50% of the time in this consultation was spent on counseling patient and coordinating communication. --------------------------------------------------------------------------------------------------------------------------------------  Symptom Management/Plan: Lung CA: Completed chemo/radiation. Plan is to commence chemo again after patient recovers from back surgery. Follow up with Oncologist in 3 weeks as planned. Continue oxygen supplementation 3 L/Min as needed for respiratory distress.  T12 compression fracture: Back surgery last week. Follow up with Orthopedic as planned. Patient will benefit from physical therapy. John [name] to follow up with Ortho.  Low back pain: Secondary to T12 compression fracture. Managed with Oxycodone.  Anxiety: Continue with Paxil as ordered. Deescalation techniques discussed.  Afib: Continue Eliquis.  Routine CBC CMP  Follow up: Palliative care will continue to follow for complex medical decision making, advance care planning, and clarification of goals. Return 6 weeks or prn. Encouraged to call provider sooner with any concerns.   Family /Caregiver/Community Supports: Patient lives at home with spouse.   HOSPICE ELIGIBILITY/DIAGNOSIS: TBD  Chief Complaint: Initial Palliative care visit  HISTORY OF PRESENT ILLNESS:  John [name] is a 61 y.o. year old male  with multiple morbidities requiring close monitoring and with high risk of complications and  mortality: Lung cancer, T12 compression fracture, low back pain, anxiety, A-fib. History obtained from review of EMR, discussion with primary team, caregiver, family and/or [name].  Review and summarization of Epic records shows history from other than patient. Rest of 10 point ROS asked and negative.  Independent interpretation of tests and reviewed as needed, available labs, patient records, imaging, studies and related documents from the EMR.  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 difficile colitis    Bacteremia due to Pseudomonas    Hypokalemia    Encounter for antineoplastic immunotherapy 06/09/2020   Drug-induced neutropenia ([place]) 08/11/2020   Persistent atrial fibrillation ([place]) 08/25/2020   Paroxysmal A-fib ([place]) 10/06/2020   RA (rheumatoid arthritis) ([place]) 10/06/2020   Malignant neoplasm of lung (H[facility])    Secondary hypercoagulable state ([place]) 10/28/2020   Shortness of breath 12/18/2020   Abnormal chest x-ray 12/18/2020   Leukocytopenia 12/22/2020   Drug-induced pneumonitis 12/23/2020   Pneumothorax on right 04/14/2021   Chronic respiratory failure with hypoxia ([place]) 04/14/2021   Normocytic anemia 04/18/2021   Age-related nuclear cataract of left eye 08/12/2014   Obesity 07/27/2021   Chronic anxiety 11/08/2014   H/O rheumatoid arthritis 11/08/2014   Petit mal seizure status ([place]) 11/08/2014   Pseudophakia of right eye 11/19/2014   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[place]) 07/29/2021   Mixed hyperlipidemia 08/02/2021   Coronary artery disease involving native coronary artery of native heart without angina pectoris 08/02/2021   Paroxysmal atrial fibrillation with RVR (HCC) 08/03/2021   T12 compression fracture ([place]) 08/23/2021   Intractable  back pain 08/24/2021   Resolved Ambulatory Problems    Diagnosis Date Noted   Atrial fibrillation with RVR ([place]) 05/12/2020   Sepsis ([place]) 10/06/2020   Acute on chronic respiratory failure with hypoxia ([facility]) 12/22/2020   Pneumothorax 03/30/2021   Past Medical History:  Diagnosis Date   Dyspnea    Family history of adverse reaction to anesthesia    GERD (gastroesophageal reflux disease)    History of radiation therapy 04/24/2020-05/16/2020   PAF (paroxysmal atrial fibrillation) (HCC)    Rheumatoid aortitis    Squamous cell lung cancer ([place])     SOCIAL HX:  Social History   Tobacco Use   Smoking status: Former    Packs/day: 1.00    Years: 30.00    Total pack years: 30.00    Types: Cigarettes    Quit date: 2018    Years since quitting: 5.5   Smokeless tobacco: Never   Tobacco comments:    Former smoker 10/28/2020  Substance Use Topics   Alcohol use: Not Currently     FAMILY HX:  Family History  Problem Relation Age of Onset   Arrhythmia Mother        has PPM   Heart disease Father        Died at 58, started in his 49s, heart attacks, had PPM and ICD      ALLERGIES:  Allergies  Allergen Reactions   Albuterol Other (See Comments)    Patient has had episode of atrial fib  following administration of albuterol (tolerates Xopenex well)   Atorvastatin Other (See Comments)    Causes arthritis pain      PERTINENT MEDICATIONS:  Outpatient Encounter Medications as of 09/14/2021  Medication Sig   acetaminophen (TYLENOL) 500 MG tablet Take 1,000 mg by mouth every 6 (six) hours as needed for mild pain.   amiodarone (PACERONE) 200 MG tablet Take 1 tablet (200 mg total) by mouth daily.   benzonatate (TESSALON) 200 MG capsule Take 1 capsule (200 mg total) by mouth 3 (three) times daily as needed for cough.   diclofenac Sodium (VOLTAREN) 1 % GEL Apply 1 Application topically 2 (two) times daily as needed (pain).   doxycycline (VIBRA-TABS) 100 MG tablet Take 1 tablet (100 mg  total) by mouth 2 (two) times daily.   ELIQUIS 5 MG TABS tablet TAKE 1 TABLET(5 MG) BY MOUTH TWICE DAILY   HYDROcodone bit-homatropine (HYCODAN) 5-1.5 MG/5ML syrup Take 5 mLs by mouth every 6 (six) hours as needed for cough.   metoprolol succinate (TOPROL-XL) 50 MG 24 hr tablet Take 1 tablet (50 mg total) by mouth daily. Take with or immediately following a meal.   omeprazole (PRILOSEC OTC) 20 MG tablet Take 20 mg by mouth at bedtime.   PARoxetine (PAXIL) 10 MG tablet Take 10 mg by mouth at bedtime.   polyethylene glycol (MIRALAX / GLYCOLAX) 17 g packet Take 17 g by mouth daily as needed for severe constipation.   [DISCONTINUED] prochlorperazine (COMPAZINE) 10 MG tablet Take 1 tablet (10 mg total) by mouth every 6 (six) hours as needed (Nausea or vomiting). (Patient not taking: Reported on 06/29/2021)   No facility-administered encounter medications on file as of 09/14/2021.     Thank you for the opportunity to participate in the care of Mr. Bostrom.  The palliative care team will continue to follow. Please call our office at [phone] if we can be of additional assistance.   Note: Portions of this note were generated with [employer]. Dictation errors may occur despite best attempts at proofreading.  Teodoro Spray, NP

## 2021-09-14 NOTE — Progress Notes (Signed)
+ Hematology and Oncology Follow Up Visit  John [name] [id number] October 19, 1960 61 y.o. 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  Unfortunately, he is not doing well at all.  He is incredibly frustrated.  He went to have his kyphoplasty done at T12.  Apparently, there is some issues.  Apparently from what the note from Interventional Radiology set was he could not get adequately sedated.  He is having some moving.  It is hard for them to do the kyphoplasty.  They got the T12 kyphoplasty done.  However, they cannot do the L3 kyphoplasty.  From the note, it seems as if they may wish to forego this and have him do non- interventional treatments.  Because he is not feeling well, he does not want to do any chemotherapy right now.  He was in the hospital with the compression fractures of his spine.  These were not malignant.  These are from him being on steroids all the time.  He clearly needs to have better pain med regimen.  we will try him on MS Contin at 15 mg p.o. twice daily.  He will have oxycodone 10 mg as needed for breakthrough pain every 6 hours.  I do think that vitamin D will help.  We will try him on 50,000 units once a week.  He is just frustrated.  He just wants to feel better.  I do feel bad for him.  He is on oxygen.  Thankfully, I do not think he has had any additional problems with his lungs.  He has had the atrial fibrillation.  He is on Eliquis.  He is on amiodarone.  He is also on metoprolol.  How much or how much she really is eating.  His appetite is not all that  great because he hurts.  He is not able to do all that much because he hurts.  Hopefully, with the better pain regimen, he might be able to be a little more active.  It would be nice if Interventional Radiology would be able to help with the kyphoplasty.  If not, then I may have to see if possibly Orthopedic Surgery might do this.  He took radiation therapy for the adrenal metastasis.  I think this was a right adrenal metastasis.  He is not having diarrhea.  He is urinating okay.  He is not having any rashes.  He has had little bit of leg swelling.  Overall, I would say his performance status is probably ECOG 2.    Medications:  Allergies as of 09/14/2021       Reactions   Albuterol Other (See Comments)   Patient has had episode of atrial fib following administration of albuterol (tolerates Xopenex well)   Atorvastatin Other (See Comments)   Causes arthritis pain        Medication List        Accurate as of September 14, 2021  9:50 AM. If you have any questions, ask your nurse or doctor.  STOP taking these medications    predniSONE 10 MG tablet Commonly known as: DELTASONE Stopped by: Volanda Napoleon, MD       TAKE these medications    acetaminophen 500 MG tablet Commonly known as: TYLENOL Take 1,000 mg by mouth every 6 (six) hours as needed for mild pain.   amiodarone 200 MG tablet Commonly known as: Pacerone Take 1 tablet (200 mg total) by mouth daily.   benzonatate 200 MG capsule Commonly known as: TESSALON Take 1 capsule (200 mg total) by mouth 3 (three) times daily as needed for cough.   diclofenac Sodium 1 % Gel Commonly known as: VOLTAREN Apply 1 Application topically 2 (two) times daily as needed (pain).   doxycycline 100 MG tablet Commonly known as: VIBRA-TABS Take 1 tablet (100 mg total) by mouth 2 (two) times daily.   Eliquis 5 MG Tabs tablet Generic drug: apixaban TAKE 1 TABLET(5 MG) BY MOUTH TWICE DAILY   HYDROcodone bit-homatropine  5-1.5 MG/5ML syrup Commonly known as: HYCODAN Take 5 mLs by mouth every 6 (six) hours as needed for cough.   metoprolol succinate 50 MG 24 hr tablet Commonly known as: TOPROL-XL Take 1 tablet (50 mg total) by mouth daily. Take with or immediately following a meal.   omeprazole 20 MG tablet Commonly known as: PRILOSEC OTC Take 20 mg by mouth at bedtime.   PARoxetine 10 MG tablet Commonly known as: PAXIL Take 10 mg by mouth at bedtime.   polyethylene glycol 17 g packet Commonly known as: MIRALAX / GLYCOLAX Take 17 g by mouth daily as needed for severe constipation.        Allergies:  Allergies  Allergen Reactions   Albuterol Other (See Comments)    Patient has had episode of atrial fib following administration of albuterol (tolerates Xopenex well)   Atorvastatin Other (See Comments)    Causes arthritis pain    Past Medical History, Surgical history, Social history, and Family History were reviewed and updated.  Review of Systems: Review of Systems  Constitutional:  Positive for malaise/fatigue.  HENT: Negative.    Eyes: Negative.   Respiratory:  Positive for cough and shortness of breath.   Cardiovascular:  Positive for chest pain.  Gastrointestinal: Negative.   Genitourinary: Negative.   Musculoskeletal:  Positive for joint pain.  Skin: Negative.   Neurological:  Positive for focal weakness.  Endo/Heme/Allergies: Negative.   Psychiatric/Behavioral: Negative.       Physical Exam:  weight is 230 lb 8 oz (104.6 kg).   Wt Readings from Last 3 Encounters:  09/14/21 230 lb 8 oz (104.6 kg)  09/07/21 230 lb (104.3 kg)  08/23/21 222 lb 10.6 oz (101 kg)  Vital signs are temperature of 98.4.  Pulse 94.  Blood pressure 116/75.  Weight is 233 pounds.  Physical Exam Vitals reviewed.  HENT:     Head: Normocephalic and atraumatic.  Eyes:     Pupils: Pupils are equal, round, and reactive to light.  Cardiovascular:     Rate and Rhythm: Normal rate and regular rhythm.      Heart sounds: Normal heart sounds.  Pulmonary:     Effort: Pulmonary effort is normal.     Breath sounds: Normal breath sounds.  Abdominal:     General: Bowel sounds are normal.     Palpations: Abdomen is soft.  Musculoskeletal:        General: No tenderness or deformity. Normal range of motion.     Cervical back: Normal range of motion.  Lymphadenopathy:     Cervical: No cervical adenopathy.  Skin:    General: Skin is warm and dry.     Findings: No erythema or rash.  Neurological:     Mental Status: He is alert and oriented to person, place, and time.  Psychiatric:        Behavior: Behavior normal.        Thought Content: Thought content normal.        Judgment: Judgment normal.    Lab Results  Component Value Date   WBC 10.1 09/14/2021   HGB 10.9 (L) 09/14/2021   HCT 33.9 (L) 09/14/2021   MCV 89.2 09/14/2021   PLT 383 09/14/2021   Lab Results  Component Value Date   FERRITIN 446 (H) 08/23/2021   IRON 189 (H) 08/26/2021   TIBC 213 (L) 08/26/2021   UIBC 24 08/26/2021   IRONPCTSAT 89 (H) 08/26/2021   Lab Results  Component Value Date   RETICCTPCT 1.9 08/23/2021   RBC 3.80 (L) 09/14/2021   No results found for: "KPAFRELGTCHN", "LAMBDASER", "KAPLAMBRATIO" No results found for: "IGGSERUM", "IGA", "IGMSERUM" No results found for: "TOTALPROTELP", "ALBUMINELP", "A1GS", "A2GS", "BETS", "BETA2SER", "GAMS", "MSPIKE", "SPEI"   Chemistry      Component Value Date/Time   NA 138 08/29/2021 0412   K 4.2 08/29/2021 0412   CL 99 08/29/2021 0412   CO2 32 08/29/2021 0412   BUN 18 08/29/2021 0412   CREATININE 0.87 08/29/2021 0412   CREATININE 0.97 08/12/2021 0844      Component Value Date/Time   CALCIUM 8.4 (L) 08/29/2021 0412   ALKPHOS 74 08/29/2021 0412   AST 13 (L) 08/29/2021 0412   AST 11 (L) 08/12/2021 0844   ALT 17 08/29/2021 0412   ALT 16 08/12/2021 0844   BILITOT 0.5 08/29/2021 0412   BILITOT 0.5 08/12/2021 0844       Impression and Plan: Mr. Haen is  a very pleasant 61 yo gentleman with metastatic squamous cell carcinoma of the right lung. Tumor had high PD-L1 score.   Again, he does not want to have any chemotherapy right now.  He hurts too much.  I am not sure how we might be able to help with the back.  Again, maybe, interventional Radiology will be able to do another procedure.  Hopefully they might be able to help L3.  We will see if we can get him back to see [place] in about 3 weeks.  Then, he might be more willing to take treatment if he is feeling better and more active.  I know this has been incredibly difficult for him.  He has had some many issues.  A lot of the issues not even related to his cancer.  He has a recurrent pneumothorax.  This compression fracture is not from cancer is from steroids that he been taking.     Volanda Napoleon, MD 7/31/20239:50 AM

## 2021-09-15 ENCOUNTER — Telehealth (HOSPITAL_COMMUNITY): Payer: Self-pay

## 2021-09-15 ENCOUNTER — Encounter: Payer: Self-pay | Admitting: Surgery

## 2021-09-15 ENCOUNTER — Ambulatory Visit: Payer: 59

## 2021-09-15 NOTE — Telephone Encounter (Signed)
Called to schedule kyphoplasty. Pt would like to wait until after he sees cardiology on 8/15 to schedule. AW

## 2021-09-15 NOTE — Progress Notes (Signed)
PA for Morphine 15 mg 12 hour tablets was approved by [employer] from 09/15/21 through 09/16/23.  I called the pt's pharmacy to verify the PA approval.

## 2021-09-15 NOTE — Telephone Encounter (Signed)
Called to schedule kp, no answer, left vm. AW 

## 2021-09-21 ENCOUNTER — Telehealth: Payer: Self-pay | Admitting: Oncology

## 2021-09-21 ENCOUNTER — Ambulatory Visit: Payer: 59

## 2021-09-21 NOTE — Telephone Encounter (Signed)
Called Teshaun regarding restarting radiation. He said he would like to wait 3 weeks. He is doing better but is still in bed most of the time.  He is trying to get up a couple times a day.  He also said he does not want to have the second kyphoplasty.

## 2021-09-22 ENCOUNTER — Encounter: Payer: Self-pay | Admitting: Emergency Medicine

## 2021-09-22 ENCOUNTER — Ambulatory Visit: Payer: 59 | Admitting: Emergency Medicine

## 2021-09-22 ENCOUNTER — Other Ambulatory Visit (HOSPITAL_COMMUNITY): Payer: Self-pay | Admitting: Neuroradiology

## 2021-09-22 ENCOUNTER — Encounter: Payer: Self-pay | Admitting: Hematology & Oncology

## 2021-09-22 ENCOUNTER — Ambulatory Visit: Payer: 59

## 2021-09-22 ENCOUNTER — Encounter: Payer: Self-pay | Admitting: Internal Medicine

## 2021-09-22 DIAGNOSIS — S32010A Wedge compression fracture of first lumbar vertebra, initial encounter for closed fracture: Secondary | ICD-10-CM

## 2021-09-22 DIAGNOSIS — R059 Cough, unspecified: Secondary | ICD-10-CM | POA: Insufficient documentation

## 2021-09-22 DIAGNOSIS — C3491 Malignant neoplasm of unspecified part of right bronchus or lung: Secondary | ICD-10-CM

## 2021-09-22 DIAGNOSIS — S32030A Wedge compression fracture of third lumbar vertebra, initial encounter for closed fracture: Secondary | ICD-10-CM

## 2021-09-22 DIAGNOSIS — J9611 Chronic respiratory failure with hypoxia: Secondary | ICD-10-CM

## 2021-09-22 DIAGNOSIS — R053 Chronic cough: Secondary | ICD-10-CM

## 2021-09-22 HISTORY — DX: Cough, unspecified: R05.9

## 2021-09-22 NOTE — Assessment & Plan Note (Signed)
Significant cough, causing/contributing to his back pain.  Reassured him that he can use the Hycodan cough syrup that is prescribed along with his existing oxycodone.  He knows to be careful with any side effects, lethargy, dizziness, etc.

## 2021-09-22 NOTE — Progress Notes (Signed)
Subjective:    Patient ID: John [name], male    DOB: 08/07/1960, 61 y.o.   MRN: [id number]  HPI  ROV 06/17/21 --61 year old gentleman who follows with today after complicated course post admission for acute right pneumothorax.  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  His right-sided chest tube is now out.  Today he reports that he is feeling better. He is now on chemotherapy with Dr [name]. He is using Anoro qd, can get SOB with exertion. He uses O2 with heavier exertion 2-3L/min. He has tanks, would prefer a POC and has tried to get one, but have not achieved this with DME yet. Minimal to no albuterol use. Planning for repeat CT CAP in a few weeks.   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  He had been managed initially on Keytruda but this was complicated by pneumonitis.  He had tried Anoro but did not get any clinical benefit so this has been discontinued.  He was changed to [facility] in June but never started taking it, also received a prednisone taper at that time.  He has had kyphoplasty, needs another one but unclear when this will be done. He is planning to talk to Dr [name] about the options for further therapy - he is unsure whether he is going to do this. He just finished a course of doxycycline. He remains on prednisone 5mg    Most recent chest imaging CT 08/20/21 shows right perihilar fibrotic and atelectatic change stable compared with prior, diffuse groundglass attenuation, again unchanged.  No pneumothorax no effusion   Review of Systems As per HPI      Objective:   Physical Exam Vitals:   09/22/21 1628  BP: 120/68  Pulse: 77  Temp: 98.3 F (36.8 C)  TempSrc: Oral  SpO2: 90%  Weight: 219 lb (99.3 kg)  Height: 5\' 11"   (1.803 m)   Gen: Pleasant, well-nourished, in no distress, depressed affect  ENT: No lesions,  mouth clear,  oropharynx clear, no postnasal drip  Neck: No JVD, no stridor  Lungs: No use of accessory muscles, bilateral crackles and rhonchi more so on the right  Cardiovascular: RRR, heart sounds normal, no murmur or gallops, no peripheral edema  Musculoskeletal: No deformities  Neuro: alert, awake, non focal  Skin: Warm, no lesions or rash        Assessment & Plan:  Chronic respiratory failure with hypoxia (HCC) Principally due to restrictive disease from scarring, prior pneumonitis from [facility].  He does not respond significantly to bronchodilator, stop Stiolto and did not start [employer].  I will give him an albuterol inhaler to have available as needed but I do not think we need to restart scheduled BD therapy right now.  Continue his oxygen.  Stage 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 (HCC) Followed by [name].  Unclear whether he will want to try any further therapy given his difficulty with side effects, we can condition.  Cough Significant cough, causing/contributing to his back pain.  Reassured him that he can use the Hycodan cough syrup that is prescribed along with his existing oxycodone.  He knows to be careful with any side effects, lethargy, dizziness, etc.  Time spent 40 minutes  Baltazar Apo, MD, PhD 09/22/2021, 5:21 PM [facility] [phone] or if no answer before 7:00PM call [phone] For any issues after 7:00PM please call eLink [phone]

## 2021-09-22 NOTE — Assessment & Plan Note (Signed)
Principally due to restrictive disease from scarring, prior pneumonitis from [facility].  He does not respond significantly to bronchodilator, stop Stiolto and did not start [employer].  I will give him an albuterol inhaler to have available as needed but I do not think we need to restart scheduled BD therapy right now.  Continue his oxygen.

## 2021-09-22 NOTE — Assessment & Plan Note (Signed)
Followed by [name].  Unclear whether he will want to try any further therapy given his difficulty with side effects, we can condition.

## 2021-09-22 NOTE — Patient Instructions (Addendum)
Please continue your oxygen We will not restart a scheduled inhaler medication at this time. We will give you a prescription for albuterol.  You can keep this available to use 2 puffs if needed for spells of shortness of breath, coughing. Usual Hycodan cough syrup 5 cc (2 teaspoons) up to every 6 hours if needed for cough suppression. Follow [name] as planned Follow with Dr [name] in 4 months or sooner if you have any problems.

## 2021-09-24 ENCOUNTER — Ambulatory Visit: Payer: 59

## 2021-09-25 ENCOUNTER — Ambulatory Visit: Payer: 59

## 2021-09-28 NOTE — Progress Notes (Unsigned)
Cardiology Office Note:    Date:  09/29/2021   ID:  John [name], DOB Nov 01, 1960, MRN [id number]  PCP:  [name], [place] [facility] Cardiologist:  Fransico Him, MD     Referring MD: [name], MD   CC: Here for preop clearance appointment  History of Present Illness:    John [name] is a 61 y.o. 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  Initially anticoagulation was not started due to his bleeding risk with tumor that was pressing on pulmonary artery, CHA2DS2-VASc score was 3.  There was concern also regarding lung toxicity risk due to amiodarone or Keytruda on CT scan, amiodarone was stopped in June 2022.  Pulmonary was consulted, and amiodarone was resumed. He underwent cardioversion in August 2022, was placed on Xarelto.  CT scan showed coronary calcifications in 2 vessels.  In November 2022, he experienced sepsis with acute hypoxic respiratory failure due to community-acquired pneumonia versus drug-induced pneumonitis from [facility], CTA of the chest did not show PE.  Required 15 L of oxygen on nonrebreather.  He went into A-fib RVR, and metoprolol XL was increased to 100 mg daily, and Xarelto was changed to Eliquis 2.5 mg twice daily.  He has been followed by the A-fib clinic.  Was admitted in February 2023 for spontaneous pneumothorax, required a chest tube, and returned to the ED with subcutaneous emphysema due to chest tube malfunction.  Chest tube was removed and was monitored through chest x-ray.  Was found to have expansion of pneumothorax and went back to the ED in March 2023, amiodarone was discontinued.  At his last office visit with Christen Bame, NP in May 2023, his heart  rate was controlled on metoprolol succinate 25 mg daily, was advised he can take an additional metoprolol 25 mg if his heart rate was consistently greater than 100 bpm or for symptoms of A-fib.  Was tolerating Eliquis 2.5 mg twice daily well.  Has had history of statin intolerance.  Most recently at his A-fib clinic appointment in June 2023 he was continued on amiodarone 200 mg twice daily for total of 2 weeks, then dosage was decreased to 200 mg daily, he was stopped on Keytruda.   He presented to the hospital on August 23, 2021 for a chief complaint of back pain, he had hit himself against the washer 2 weeks before admission, was found to have T12 compression fracture, most likely related to his lung cancer, oncology was consulted and recommended kyphoplasty. Chest x-ray showed mild progression of right lower lobe airspace disease.  No evidence of DVT in bilateral lower extremities.  Echocardiogram LVEF 60 to 65%, mild dilatation of the aortic root, measuring 40 mm, otherwise normal. Neurosurgery recommended DLCO, was discharged with follow-up as an outpatient with narcotics.   Today he presents for pre-op clearance for kyphoplasty with his wife present. Knows about plan for upcoming surgery, but says at this point he is not sure about undergoing surgery. Was able to obtain some of his history, but because patient's oxygen was running low, patient requested to leave the office. Only part of history was able to be obtained as outlined below.   >>3 weeks ago he went into A-fib that lasted for  about a week or so,  did not seek treatment for this, says he thinks he converted back to normal sinus rhythm. Did not experience any palpitations or fast heart rate during that time.  Stated he had one surgery around July 25th, 2023 but did not have any preop cardiovascular clearance for that. Per his report, they did not finish his surgery and they need to go back and finish his surgery. Wearing 3 L of oxygen via nasal  cannula. Said he was weaning himself off oxygen, but has had to go to 3 liters. Denies any chest pain, worsening shortness of breath, syncope, presyncope, dizziness, swelling, or claudication.  Has lost about 15 pounds since he got sick.  Denies any orthopnea/PND.  Time with patient was short today, as patient's oxygen was running low and patient requested to leave.     Past Medical History:  Diagnosis Date   Atrial fibrillation with RVR ([place]) 05/12/2020   Dyspnea    Family history of adverse reaction to anesthesia    brother with seizures had episode under anesthesia.  Patient has seizures as well.   GERD (gastroesophageal reflux disease)    History of radiation therapy 04/24/2020-05/16/2020   IMRT to right lung     Dr [name]   PAF (paroxysmal atrial fibrillation) ([place])    CHADS2VSAC score 0   Rheumatoid aortitis    Sepsis ([place]) 10/06/2020   Squamous cell lung cancer [facility])     Past Surgical History:  Procedure Laterality Date   BRONCHIAL BRUSHINGS  03/27/2020   Procedure: BRONCHIAL BRUSHINGS;  Surgeon: [name], MD;  Location: [place];  Service: Cardiopulmonary;;   BUBBLE STUDY  10/13/2020   Procedure: BUBBLE STUDY;  Surgeon: Pixie Casino, MD;  Location: [place];  Service: Cardiovascular;;   CARDIOVERSION N/A 10/13/2020   Procedure: CARDIOVERSION;  Surgeon: Pixie Casino, MD;  Location: [facility];  Service: Cardiovascular;  Laterality: N/A;   CATARACT EXTRACTION  2016   at [place]  03/27/2020   Procedure: [place];  Surgeon: [name], MD;  Location: [place];  Service: Cardiopulmonary;;   HIP SURGERY Left    IR KYPHO THORACIC WITH BONE BIOPSY  09/07/2021   IR RADIOLOGIST EVAL & MGMT  09/03/2021   TEE WITHOUT CARDIOVERSION N/A 10/13/2020   Procedure: TRANSESOPHAGEAL ECHOCARDIOGRAM (TEE);  Surgeon: Pixie Casino, MD;  Location: [place];  Service: Cardiovascular;  Laterality: N/A;   VIDEO BRONCHOSCOPY WITH  ENDOBRONCHIAL ULTRASOUND N/A 03/27/2020   Procedure: VIDEO BRONCHOSCOPY WITH ENDOBRONCHIAL ULTRASOUND;  Surgeon: [name], MD;  Location: [place];  Service: Cardiopulmonary;  Laterality: N/A;    Current Medications: Current Meds  Medication Sig   acetaminophen (TYLENOL) 500 MG tablet Take 1,000 mg by mouth every 6 (six) hours as needed for mild pain.   amiodarone (PACERONE) 200 MG tablet Take 1 tablet (200 mg total) by mouth daily.   benzonatate (TESSALON) 200 MG capsule Take 1 capsule (200 mg total) by mouth 3 (three) times daily as needed for cough.   diclofenac Sodium (VOLTAREN) 1 % GEL Apply 1 Application topically 2 (two) times daily as needed (pain).   ELIQUIS 5 MG TABS tablet TAKE 1 TABLET(5 MG) BY MOUTH TWICE DAILY   ergocalciferol (VITAMIN D2) 1.25 MG (50000 UT) capsule Take 1 capsule (50,000 Units total) by mouth once a week.   HYDROcodone bit-homatropine (HYCODAN) 5-1.5 MG/5ML syrup Take 5 mLs by mouth every 6 (six) hours as needed for  cough.   omeprazole (PRILOSEC OTC) 20 MG tablet Take 20 mg by mouth at bedtime.   oxyCODONE 10 MG TABS Take 0.5 tablets (5 mg total) by mouth every 6 (six) hours as needed for severe pain.   PARoxetine (PAXIL) 10 MG tablet Take 10 mg by mouth at bedtime.   polyethylene glycol (MIRALAX / GLYCOLAX) 17 g packet Take 17 g by mouth daily as needed for severe constipation.     Allergies:   Albuterol and Atorvastatin   Social History   Socioeconomic History   Marital status: Married    Spouse name: Not on file   Number of children: Not on file   Years of education: Not on file   Highest education level: Not on file  Occupational History   Not on file  Tobacco Use   Smoking status: Former    Packs/day: 1.00    Years: 30.00    Total pack years: 30.00    Types: Cigarettes    Quit date: 2018    Years since quitting: 5.6   Smokeless tobacco: Never   Tobacco comments:    Former smoker 10/28/2020  Vaping Use   Vaping Use: Never used   Substance and Sexual Activity   Alcohol use: Not Currently   Drug use: Never   Sexual activity: Not on file  Other Topics Concern   Not on file  Social History Narrative   ** Merged History Encounter **       Social Determinants of Health   Financial Resource Strain: Not on file  Food Insecurity: Not on file  Transportation Needs: Not on file  Physical Activity: Not on file  Stress: Not on file  Social Connections: Not on file     Family History: See HPI. Unable to obtain.  ROS:    See HPI. Unable to complete.   Please see the history of present illness.    All other systems reviewed and are negative.  EKGs/Labs/Other Studies Reviewed:    The following studies were reviewed today:   EKG:  EKG is ordered today.  The ekg ordered today demonstrates NSR, 70 bpm, pulmonary disease pattern, left anterior fascicular block, Qtc 490 ms, otherwise no acute changes.   Vascular ultrasound of lower extremities on August 05, 2021: No evidence of DVT in bilateral lower extremities.  2D echocardiogram on August 02, 2021: LVEF 60-65% The left ventricle has normal function. Left ventricular endocardial border  not optimally defined to evaluate regional wall motion even with definity  contrast. Left ventricular  diastolic function could not be evaluated.   2. Right ventricular systolic function was not well visualized. The right  ventricular size is not well visualized. Tricuspid regurgitation signal is  inadequate for assessing PA pressure.   3. The mitral valve is normal in structure. No evidence of mitral valve  regurgitation. No evidence of mitral stenosis.   4. The aortic valve is normal in structure. Aortic valve regurgitation is  not visualized. No aortic stenosis is present.   5. Aortic dilatation noted. There is mild dilatation of the aortic root,  measuring 40 mm.   6. The inferior vena cava is normal in size with greater than 50%  respiratory variability, suggesting  right atrial pressure of 3 mmHg.   FINDINGS   Left Ventricle: Left ventricular ejection fraction, by estimation, is 60  to 65%. The left ventricle has normal function. Left ventricular  endocardial border not optimally defined to evaluate regional wall motion.  Definity contrast agent was  given IV to  delineate the left ventricular endocardial borders. The left ventricular  internal cavity size was normal in size. There is no left ventricular  hypertrophy. Left ventricular diastolic function could not be evaluated  due to atrial fibrillation. Left  ventricular diastolic function could not be evaluated.   Right Ventricle: The right ventricular size is not well visualized. Right  vetricular wall thickness was not assessed. Right ventricular systolic  function was not well visualized. Tricuspid regurgitation signal is  inadequate for assessing PA pressure.   Left Atrium: Left atrial size was not well visualized.   Right Atrium: Right atrial size was not well visualized.   Pericardium: There is no evidence of pericardial effusion.   Mitral Valve: The mitral valve is normal in structure. No evidence of  mitral valve regurgitation. No evidence of mitral valve stenosis.   Tricuspid Valve: The tricuspid valve is normal in structure. Tricuspid  valve regurgitation is not demonstrated. No evidence of tricuspid  stenosis.   Aortic Valve: The aortic valve is normal in structure. Aortic valve  regurgitation is not visualized. No aortic stenosis is present.   Pulmonic Valve: The pulmonic valve was normal in structure. Pulmonic valve  regurgitation is not visualized. No evidence of pulmonic stenosis.   Aorta: Aortic dilatation noted. There is mild dilatation of the aortic  root, measuring 40 mm.   Venous: The inferior vena cava is normal in size with greater than 50%  respiratory variability, suggesting right atrial pressure of 3 mmHg.   IAS/Shunts: No atrial level shunt detected by color  flow Doppler.       Recent Labs: 08/12/2021: TSH 2.571 08/20/2021: B Natriuretic Peptide 34.6 08/26/2021: Magnesium 2.0 09/14/2021: ALT 9; BUN 8; Creatinine 0.86; Hemoglobin 10.9; Platelet Count 383; Potassium 4.3; Sodium 132  Recent Lipid Panel    Component Value Date/Time   CHOL 237 (H) 06/29/2021 0754   TRIG 121 06/29/2021 0754   HDL 31 (L) 06/29/2021 0754   CHOLHDL 7.6 06/29/2021 0754   VLDL 24 06/29/2021 0754   LDLCALC 182 (H) 06/29/2021 0754     Risk Assessment/Calculations:    CHA2DS2-VASc Score = 3  This indicates a 3.2% annual risk of stroke. The patient's score is based upon: CHF History: 0 HTN History: 0 Diabetes History: 0 Stroke History: 2 (possible spenic embolic event) Vascular Disease History: 1 Age Score: 0 Gender Score: 0   Revised Cardiac Risk Index (RCRI) score is 0. Perioperative Risk of Major Cardiac Event is 0.4%.   DASI Score is 0. Functional Capacity in METS is 2.74.   Physical Exam:    VS:  BP 108/68   Pulse 70   Ht 5\' 11"  (1.803 m)   Wt 215 lb 12.8 oz (97.9 kg)   SpO2 92%   BMI 30.10 kg/m     Wt Readings from Last 3 Encounters:  09/29/21 215 lb 12.8 oz (97.9 kg)  09/22/21 219 lb (99.3 kg)  09/14/21 230 lb 8 oz (104.6 kg)     GEN: Well nourished, well developed, 61 y.o. male in no acute distress NEUROLOGIC:  Alert and oriented x 3 PSYCHIATRIC:  Agitated and anxious  Unable to perform rest of physical exam as patient's oxygen was running low and he requested to leave.   ASSESSMENT:    1. Preoperative examination   2. Persistent atrial fibrillation (HCC)    PLAN:    In order of problems listed above:  Pre-operative examination RCRI is 0 and DASI score is 0 with functional  capacity in METS is 2.74.  Patient's overall risk is low. Limitation in functional capacity seems to be due to his pulmonary symptoms, and it would be reasonable to consider a stress test for this patient. However, given patient's comorbidities, not sure if  stress test is reasonable for him. Will reach out to patient's cardiologist Dr. Radford Pax for further recommendations. Will update patient accordingly once I hear back from Dr. Radford Pax.  2. Paroxysmal A-fib - chronic, stable 12 lead EKG today revealed NSR, 70 bpm, left anterior fascicular block, pulmonary disease pattern, Qtc is 490 ms, otherwise no acute changes. No medication changes at this time, as patient requested to leave due to oxygen running low. He is on the appropriate dosing of Eliquis. Continue to follow up with A-fib clinic. Continue Amiodarone as scheduled. Recent TSH WNL. AST 13 and ALT 9. Will recommend annual eye exam while on Amiodarone. He is following Pulmonology who closely monitoring his imaging studies, so patient does not need a 2 view chest x-ray at this time.   3. Disposition: Follow up depending on Dr. Theodosia Blender  recommendations. Currently pending for November.  If Dr. Radford Pax would like to see patient sooner or further out than this, we will arrange as she deems necessary.  Medication Adjustments/Labs and Tests Ordered: Current medicines are reviewed at length with the patient today.  Concerns regarding medicines are outlined above.  Orders Placed This Encounter  Procedures   EKG 12-Lead   No orders of the defined types were placed in this encounter.   Patient Instructions  Medication Instructions:  Your physician recommends that you continue on your current medications as directed. Please refer to the Current Medication list given to you today.  *If you need a refill on your cardiac medications before your next appointment, please call your pharmacy*   Lab Work: None ordered  If you have labs (blood work) drawn today and your tests are completely normal, you will receive your results only by: [place] (if you have MyChart) OR A paper copy in the mail If you have any lab test that is abnormal or we need to change your treatment, we will call you to review  the results.   Testing/Procedures: None ordered   Follow-Up: At [facility], you and your health needs are our priority.  As part of our continuing mission to provide you with exceptional heart care, we have created designated Provider Care Teams.  These Care Teams include your primary Cardiologist (physician) and Advanced Practice Providers (APPs -  Physician Assistants and Nurse Practitioners) who all work together to provide you with the care you need, when you need it.  We recommend signing up for the patient portal called "MyChart".  Sign up information is provided on this After Visit Summary.  MyChart is used to connect with patients for Virtual Visits (Telemedicine).  Patients are able to view lab/test results, encounter notes, upcoming appointments, etc.  Non-urgent messages can be sent to your provider as well.   To learn more about what you can do with MyChart, go to [email or web address].    Your next appointment:   3 month(s)  The format for your next appointment:   In Person  Provider:   Fransico Him, MD     Other Instructions   Important Information About Sugar         Signed, Finis Bud, NP  09/29/2021 8:08 PM    [place]

## 2021-09-29 ENCOUNTER — Encounter: Payer: Self-pay | Admitting: Nurse Practitioner

## 2021-09-29 ENCOUNTER — Ambulatory Visit: Payer: 59

## 2021-09-29 ENCOUNTER — Ambulatory Visit (INDEPENDENT_AMBULATORY_CARE_PROVIDER_SITE_OTHER): Payer: 59 | Admitting: Nurse Practitioner

## 2021-09-29 ENCOUNTER — Encounter: Payer: Self-pay | Admitting: Cardiology

## 2021-09-29 VITALS — BP 108/68 | HR 70 | Ht 71.0 in | Wt 215.8 lb

## 2021-09-29 DIAGNOSIS — I4819 Other persistent atrial fibrillation: Secondary | ICD-10-CM | POA: Diagnosis not present

## 2021-09-29 DIAGNOSIS — Z01818 Encounter for other preprocedural examination: Secondary | ICD-10-CM

## 2021-09-29 NOTE — Patient Instructions (Signed)
Medication Instructions:  Your physician recommends that you continue on your current medications as directed. Please refer to the Current Medication list given to you today.  *If you need a refill on your cardiac medications before your next appointment, please call your pharmacy*   Lab Work: None ordered  If you have labs (blood work) drawn today and your tests are completely normal, you will receive your results only by: [place] (if you have MyChart) OR A paper copy in the mail If you have any lab test that is abnormal or we need to change your treatment, we will call you to review the results.   Testing/Procedures: None ordered   Follow-Up: At [facility], you and your health needs are our priority.  As part of our continuing mission to provide you with exceptional heart care, we have created designated Provider Care Teams.  These Care Teams include your primary Cardiologist (physician) and Advanced Practice Providers (APPs -  Physician Assistants and Nurse Practitioners) who all work together to provide you with the care you need, when you need it.  We recommend signing up for the patient portal called "MyChart".  Sign up information is provided on this After Visit Summary.  MyChart is used to connect with patients for Virtual Visits (Telemedicine).  Patients are able to view lab/test results, encounter notes, upcoming appointments, etc.  Non-urgent messages can be sent to your provider as well.   To learn more about what you can do with MyChart, go to [email or web address].    Your next appointment:   3 month(s)  The format for your next appointment:   In Person  Provider:   Fransico Him, MD     Other Instructions   Important Information About Sugar

## 2021-10-01 ENCOUNTER — Ambulatory Visit: Payer: Self-pay | Admitting: Radiation Oncology

## 2021-10-01 NOTE — Telephone Encounter (Addendum)
Called and spoke with the patient last night 8/16 regarding his concerns at his visit on 8/15.  Patient also asked about his visit with Pharm D on 6/12 and stated that the PA for the Repatha was denied and asked what he should be taking instead for cholesterol.   Patient also states that he had a lab appointment on Monday and is requesting that his labs be checked when he sees [name] on 8/22 instead.   Made patient aware that I would reach out to Dr. Antonieta Pert office on 8/17 to coordinate lab draw and reach out to Dr. Retta Diones regarding cholesterol management and call him back on 8/17.  Discussed patient's cholesterol management with Dr. Radford Pax who defers to [facility]. Discussed with PharmD who states that they can resubmit PA for Repatha if patient agreeable or patient can restart zetia 10 mg QD. Called patient back today and made him aware that I was able to coordinate his LIPIDS and HFP to be drawn at Dr. Antonieta Pert office on 8/22. Made patient aware of recommendations above for cholesterol management. Patient would like to wait on starting zetia and try the PA for the Repatha at this time. Message has been sent to PharmD to make him aware of patient's request.  Patient thanked me for the call and denied any other concerns at this time.

## 2021-10-02 ENCOUNTER — Other Ambulatory Visit: Payer: Self-pay | Admitting: Hematology & Oncology

## 2021-10-02 ENCOUNTER — Ambulatory Visit (HOSPITAL_COMMUNITY)
Admission: RE | Admit: 2021-10-02 | Discharge: 2021-10-02 | Disposition: A | Discharge status: Home / Self Care | Payer: 59 | Source: Ambulatory Visit | Attending: Neuroradiology | Admitting: Neuroradiology

## 2021-10-02 DIAGNOSIS — C3401 Malignant neoplasm of right main bronchus: Secondary | ICD-10-CM

## 2021-10-02 DIAGNOSIS — S32010A Wedge compression fracture of first lumbar vertebra, initial encounter for closed fracture: Secondary | ICD-10-CM | POA: Diagnosis present

## 2021-10-02 DIAGNOSIS — S32030A Wedge compression fracture of third lumbar vertebra, initial encounter for closed fracture: Secondary | ICD-10-CM | POA: Diagnosis present

## 2021-10-02 DIAGNOSIS — S22080A Wedge compression fracture of T11-T12 vertebra, initial encounter for closed fracture: Secondary | ICD-10-CM

## 2021-10-02 MED ORDER — OXYCODONE HCL 10 MG PO TABS: 5.0000 mg | ORAL_TABLET | Freq: Four times a day (QID) | ORAL | 0 refills | Status: DC | PRN

## 2021-10-05 ENCOUNTER — Other Ambulatory Visit: Payer: Self-pay | Admitting: *Deleted

## 2021-10-05 ENCOUNTER — Other Ambulatory Visit: Payer: 59

## 2021-10-05 ENCOUNTER — Telehealth: Payer: Self-pay | Admitting: *Deleted

## 2021-10-05 ENCOUNTER — Other Ambulatory Visit (HOSPITAL_BASED_OUTPATIENT_CLINIC_OR_DEPARTMENT_OTHER): Payer: Self-pay

## 2021-10-05 DIAGNOSIS — C3401 Malignant neoplasm of right main bronchus: Secondary | ICD-10-CM

## 2021-10-05 DIAGNOSIS — S22080A Wedge compression fracture of T11-T12 vertebra, initial encounter for closed fracture: Secondary | ICD-10-CM

## 2021-10-05 MED ORDER — OXYCODONE HCL 10 MG PO TABS
10.0000 mg | ORAL_TABLET | Freq: Four times a day (QID) | ORAL | 0 refills | Status: DC | PRN
  Filled 2021-10-05 (×2): qty 90, 23d supply, fill #0

## 2021-10-05 NOTE — Telephone Encounter (Signed)
Message received from patient stating that the prescription for Oxycodone was sent in as a half of a tablet (5 mg) on Friday and should have been sent in as a full pill at 10 mg.  [name] notified.  Call placed back to patient and patient notified that [name] would send in a prescription for Oxy 10 mg every six hours prn.  Pt requests that refill be sent to the [facility].  [name] notified of pt.'s request.

## 2021-10-06 ENCOUNTER — Inpatient Hospital Stay (HOSPITAL_BASED_OUTPATIENT_CLINIC_OR_DEPARTMENT_OTHER): Payer: 59 | Admitting: Hematology & Oncology

## 2021-10-06 ENCOUNTER — Other Ambulatory Visit: Payer: Self-pay | Admitting: Pharmacist

## 2021-10-06 ENCOUNTER — Telehealth: Payer: Self-pay

## 2021-10-06 ENCOUNTER — Telehealth: Payer: Self-pay | Admitting: *Deleted

## 2021-10-06 ENCOUNTER — Encounter: Payer: Self-pay | Admitting: Hematology & Oncology

## 2021-10-06 ENCOUNTER — Telehealth: Payer: Self-pay | Admitting: Cardiology

## 2021-10-06 ENCOUNTER — Inpatient Hospital Stay: Payer: 59

## 2021-10-06 ENCOUNTER — Inpatient Hospital Stay: Payer: 59 | Attending: Internal Medicine

## 2021-10-06 VITALS — BP 109/78 | HR 104 | Temp 98.0°F | Resp 18

## 2021-10-06 DIAGNOSIS — C3411 Malignant neoplasm of upper lobe, right bronchus or lung: Secondary | ICD-10-CM | POA: Diagnosis not present

## 2021-10-06 DIAGNOSIS — I2510 Atherosclerotic heart disease of native coronary artery without angina pectoris: Secondary | ICD-10-CM

## 2021-10-06 DIAGNOSIS — E782 Mixed hyperlipidemia: Secondary | ICD-10-CM

## 2021-10-06 DIAGNOSIS — C7970 Secondary malignant neoplasm of unspecified adrenal gland: Secondary | ICD-10-CM | POA: Diagnosis not present

## 2021-10-06 DIAGNOSIS — C3401 Malignant neoplasm of right main bronchus: Secondary | ICD-10-CM | POA: Diagnosis not present

## 2021-10-06 DIAGNOSIS — C779 Secondary and unspecified malignant neoplasm of lymph node, unspecified: Secondary | ICD-10-CM | POA: Insufficient documentation

## 2021-10-06 DIAGNOSIS — I480 Paroxysmal atrial fibrillation: Secondary | ICD-10-CM

## 2021-10-06 DIAGNOSIS — Z79899 Other long term (current) drug therapy: Secondary | ICD-10-CM | POA: Diagnosis not present

## 2021-10-06 DIAGNOSIS — M4856XA Collapsed vertebra, not elsewhere classified, lumbar region, initial encounter for fracture: Secondary | ICD-10-CM | POA: Insufficient documentation

## 2021-10-06 DIAGNOSIS — S22080A Wedge compression fracture of T11-T12 vertebra, initial encounter for closed fracture: Secondary | ICD-10-CM

## 2021-10-06 LAB — CMP (CANCER CENTER ONLY)
ALT: 7 U/L (ref 0–44)
AST: 14 U/L — ABNORMAL LOW (ref 15–41)
Albumin: 4 g/dL (ref 3.5–5.0)
Alkaline Phosphatase: 81 U/L (ref 38–126)
Anion gap: 10 (ref 5–15)
BUN: 8 mg/dL (ref 6–20)
CO2: 27 mmol/L (ref 22–32)
Calcium: 9.7 mg/dL (ref 8.9–10.3)
Chloride: 99 mmol/L (ref 98–111)
Creatinine: 0.94 mg/dL (ref 0.61–1.24)
GFR, Estimated: >60 mL/min (ref >60)
Glucose, Bld: 128 mg/dL — ABNORMAL HIGH (ref 70–99)
Potassium: 4.1 mmol/L (ref 3.5–5.1)
Sodium: 136 mmol/L (ref 135–145)
Total Bilirubin: 0.6 mg/dL (ref 0.3–1.2)
Total Protein: 7.9 g/dL (ref 6.5–8.1)

## 2021-10-06 LAB — CBC WITH DIFFERENTIAL (CANCER CENTER ONLY)
Abs Immature Granulocytes: 0.04 10*3/uL (ref 0.00–0.07)
Basophils Absolute: 0.1 10*3/uL (ref 0.0–0.1)
Basophils Relative: 1 %
Eosinophils Absolute: 0.6 10*3/uL — ABNORMAL HIGH (ref 0.0–0.5)
Eosinophils Relative: 7 %
HCT: 39.6 % (ref 39.0–52.0)
Hemoglobin: 12.6 g/dL — ABNORMAL LOW (ref 13.0–17.0)
Immature Granulocytes: 0 %
Lymphocytes Relative: 11 %
Lymphs Abs: 1 10*3/uL (ref 0.7–4.0)
MCH: 28.7 pg (ref 26.0–34.0)
MCHC: 31.8 g/dL (ref 30.0–36.0)
MCV: 90.2 fL (ref 80.0–100.0)
Monocytes Absolute: 0.7 10*3/uL (ref 0.1–1.0)
Monocytes Relative: 8 %
Neutro Abs: 6.5 10*3/uL (ref 1.7–7.7)
Neutrophils Relative %: 73 %
Platelet Count: 303 10*3/uL (ref 150–400)
RBC: 4.39 MIL/uL (ref 4.22–5.81)
RDW: 16 % — ABNORMAL HIGH (ref 11.5–15.5)
WBC Count: 9 10*3/uL (ref 4.0–10.5)
nRBC: 0 % (ref 0.0–0.2)

## 2021-10-06 LAB — SAMPLE TO BLOOD BANK

## 2021-10-06 LAB — LACTATE DEHYDROGENASE: LDH: 227 U/L — ABNORMAL HIGH (ref 98–192)

## 2021-10-06 LAB — VITAMIN D 25 HYDROXY (VIT D DEFICIENCY, FRACTURES): Vit D, 25-Hydroxy: 24.55 ng/mL — ABNORMAL LOW (ref 30–100)

## 2021-10-06 LAB — PREALBUMIN: Prealbumin: 22 mg/dL (ref 18–38)

## 2021-10-06 LAB — TSH: TSH: 3.047 u[IU]/mL (ref 0.350–4.500)

## 2021-10-06 NOTE — Telephone Encounter (Signed)
Pt returned my call.

## 2021-10-06 NOTE — Telephone Encounter (Signed)
Call placed to pt regarding message below.  Left a message for pt to call back.

## 2021-10-06 NOTE — Telephone Encounter (Signed)
Spoke with Dr. Antonieta Pert nurse who states that he is currently in the room with a patient now. However, he saw this patient this morning and would like to discuss his need for a kyphoplasty. See previous phone note from today as well in regards to stress testing for pre-op clearance. More information is needed in regards to what type of surgery and how extensive it is.  [name] would like to speak with Dr. Radford Pax directly in regards to this.

## 2021-10-06 NOTE — Progress Notes (Signed)
I called [facility] and requested the lab be added on. They requested I fax the order to [phone]

## 2021-10-06 NOTE — Telephone Encounter (Signed)
Provider would like a callback from Dr. Radford Pax regarding pt. Please advise

## 2021-10-06 NOTE — Telephone Encounter (Signed)
Pt is returning call and transferred to [place], [place].

## 2021-10-06 NOTE — Telephone Encounter (Signed)
-----   Message from Finis Bud, NP sent at 10/01/2021  4:54 PM EDT ----- The tentative surgery will be kyphoplasty d/t history of T12 compression fracture. I will loop in my CMA to reach out to him to see if he is still planning on going forward with this procedure. When I spoke with him in the office, he was leaning towards not having the surgery. We will get verification of his plan.   Thanks so much!  Kind Regards, Finis Bud, NP ----- Message ----- From: Sueanne Margarita, MD Sent: 10/01/2021   8:09 AM EDT To: Finis Bud, NP  Please find out what exactly the surgery is going to be.  If he decides not to have surgery would not do stress testing but if going to have surgery would need to know how extensive the surgery will be  ----- Message ----- From: Finis Bud, NP Sent: 09/30/2021   8:24 PM EDT To: Sueanne Margarita, MD  Thanks Dr. Radford Pax for getting back to me!  Considering stress test for pt, however not sure if this is reasonable for him. Would you pursue stress test for him given his prognosis and comorbidities? He is palliative care patient with hx of stage 4 lung cancer. He said he wasn't sure he even wanted to proceed with surgery and was very hesitant towards stress testing in the office. My recommendation is to not proceed with stress testing for this patient, but wanted to verify this with you.  Thanks so much!   Kind Regards,  Finis Bud, NP ----- Message ----- From: Sueanne Margarita, MD Sent: 09/30/2021   9:24 AM EDT To: Finis Bud, NP  6 months is fine ----- Message ----- From: Finis Bud, NP Sent: 09/29/2021   8:31 PM EDT To: Sueanne Margarita, MD  Saw this patient today for preop clearance. He requested to leave before visit was completed d/t home oxygen running - almost on empty. RCRI is 0. DASI is 0. Functional Capacity in METS is 2.74, which is most likely due to pulmonary symptoms and stage 4 lung cancer. Considering stress test for pt, but  due to his comorbidities and because palliative care patient, not sure if this is reasonable for him. Wanted to reach out to you with your input and further recommendations. His overall risk is low for surgery; however, he stated to me he is not sure he would like to undergo surgery at this time. Considering having him follow up with you in 6 months. Would you like to see patient sooner than that? Please let me know your thoughts.   Thanks so much!  Kind Regards,  Finis Bud, NP

## 2021-10-06 NOTE — Progress Notes (Signed)
+ Hematology and Oncology Follow Up Visit  John [name] [id number] 26-May-1960 61 y.o. 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 MTheardis here today for follow-up.  Unfortunately, he is not doing well at all.  He still is miserable with his back.  He has yet to have the follow-up kyphoplasty.  We have tried to get interventional radiology to get him in.  I know that they have tried really hard to get him in.  Apparently there have been other issues that have prevented this.  For some reason he seems to think that cardiology will not approve him to have the kyphoplasty's.  I will have to talk to cardiology about this.  He is suffering.  He is miserable.  I do not see that him being off anticoagulation for 2 days is going to harm him.  I know that cardiology is concerned about him having a embolic event.  I understand that.  But I also understand that his life is horrible right now.  He has no quality of life.  He did have a CT of the spine on 10/02/2021.  This did show the L1 vertebral body compression fracture.  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  We will try to get him set up with another kyphoplasty.  I know that Interventional Radiology will get him in.  As far as his cancer is concerned, I think this is holding pretty steady.  He has not been on chemotherapy for a while.  We have set up with Gemzar/Navelbine.  Unfortunately he does not wish to have chemotherapy until after he gets the kyphoplasty.  He wants to feel better.  Does not want to hurt.  He has had no issues with nausea or vomiting.   He has had shortness of breath.  He does have the lung issues.  He is on supplemental oxygen.  His appetite is down a little bit.  He has had no problems with diarrhea.  Overall, I would have to say that his performance status is probably ECOG 2.      Medications:  Allergies as of 10/06/2021       Reactions   Albuterol Other (See Comments)   Patient has had episode of atrial fib following administration of albuterol (tolerates Xopenex well)   Atorvastatin Other (See Comments)   Causes arthritis pain        Medication List        Accurate as of October 06, 2021 11:05 AM. If you have any questions, ask your nurse or doctor.          acetaminophen 500 MG tablet Commonly known as: TYLENOL Take 1,000 mg by mouth every 6 (six) hours as needed for mild pain.   amiodarone 200 MG tablet Commonly known as: Pacerone Take 1 tablet (200 mg total) by mouth daily.   benzonatate 200 MG capsule Commonly known as: TESSALON Take 1 capsule (200 mg total) by mouth 3 (three) times daily as needed for cough.   diclofenac Sodium 1 % Gel Commonly known as: VOLTAREN Apply  1 Application topically 2 (two) times daily as needed (pain).   Eliquis 5 MG Tabs tablet Generic drug: apixaban TAKE 1 TABLET(5 MG) BY MOUTH TWICE DAILY   ergocalciferol 1.25 MG (50000 UT) capsule Commonly known as: VITAMIN D2 Take 1 capsule (50,000 Units total) by mouth once a week.   HYDROcodone bit-homatropine 5-1.5 MG/5ML syrup Commonly known as: HYCODAN Take 5 mLs by mouth every 6 (six) hours as needed for cough.   metoprolol succinate 50 MG 24 hr tablet Commonly known as: TOPROL-XL Take 1 tablet (50 mg total) by mouth daily. Take with or immediately following a meal.   omeprazole 20 MG tablet Commonly known as: PRILOSEC OTC Take 20 mg by mouth at bedtime.   Oxycodone HCl 10 MG Tabs Take 1 tablet (10 mg total) by mouth every 6 (six) hours as needed.   PARoxetine 10 MG tablet Commonly known as:  PAXIL Take 10 mg by mouth at bedtime.   polyethylene glycol 17 g packet Commonly known as: MIRALAX / GLYCOLAX Take 17 g by mouth daily as needed for severe constipation.   predniSONE 5 MG tablet Commonly known as: DELTASONE Take 5 mg by mouth daily.        Allergies:  Allergies  Allergen Reactions   Albuterol Other (See Comments)    Patient has had episode of atrial fib following administration of albuterol (tolerates Xopenex well)   Atorvastatin Other (See Comments)    Causes arthritis pain    Past Medical History, Surgical history, Social history, and Family History were reviewed and updated.  Review of Systems: Review of Systems  Constitutional:  Positive for malaise/fatigue.  HENT: Negative.    Eyes: Negative.   Respiratory:  Positive for cough and shortness of breath.   Cardiovascular:  Positive for chest pain.  Gastrointestinal: Negative.   Genitourinary: Negative.   Musculoskeletal:  Positive for joint pain.  Skin: Negative.   Neurological:  Positive for focal weakness.  Endo/Heme/Allergies: Negative.   Psychiatric/Behavioral: Negative.       Physical Exam:  oral temperature is 98 F (36.7 C). His blood pressure is 109/78 and his pulse is 104 (abnormal). His respiration is 18 and oxygen saturation is 92%.   Wt Readings from Last 3 Encounters:  09/29/21 215 lb 12.8 oz (97.9 kg)  09/22/21 219 lb (99.3 kg)  09/14/21 230 lb 8 oz (104.6 kg)  Vital signs are temperature of 98.4.  Pulse 94.  Blood pressure 116/75.  Weight is 233 pounds.  Physical Exam Vitals reviewed.  HENT:     Head: Normocephalic and atraumatic.  Eyes:     Pupils: Pupils are equal, round, and reactive to light.  Cardiovascular:     Rate and Rhythm: Normal rate and regular rhythm.     Heart sounds: Normal heart sounds.  Pulmonary:     Effort: Pulmonary effort is normal.     Breath sounds: Normal breath sounds.  Abdominal:     General: Bowel sounds are normal.     Palpations: Abdomen  is soft.  Musculoskeletal:        General: No tenderness or deformity. Normal range of motion.     Cervical back: Normal range of motion.  Lymphadenopathy:     Cervical: No cervical adenopathy.  Skin:    General: Skin is warm and dry.     Findings: No erythema or rash.  Neurological:     Mental Status: He is alert and oriented to person, place, and time.  Psychiatric:  Behavior: Behavior normal.        Thought Content: Thought content normal.        Judgment: Judgment normal.     Lab Results  Component Value Date   WBC 9.0 10/06/2021   HGB 12.6 (L) 10/06/2021   HCT 39.6 10/06/2021   MCV 90.2 10/06/2021   PLT 303 10/06/2021   Lab Results  Component Value Date   FERRITIN 446 (H) 08/23/2021   IRON 189 (H) 08/26/2021   TIBC 213 (L) 08/26/2021   UIBC 24 08/26/2021   IRONPCTSAT 89 (H) 08/26/2021   Lab Results  Component Value Date   RETICCTPCT 1.9 08/23/2021   RBC 4.39 10/06/2021   No results found for: "KPAFRELGTCHN", "LAMBDASER", "KAPLAMBRATIO" No results found for: "IGGSERUM", "IGA", "IGMSERUM" No results found for: "TOTALPROTELP", "ALBUMINELP", "A1GS", "A2GS", "BETS", "BETA2SER", "GAMS", "MSPIKE", "SPEI"   Chemistry      Component Value Date/Time   NA 136 10/06/2021 0909   K 4.1 10/06/2021 0909   CL 99 10/06/2021 0909   CO2 27 10/06/2021 0909   BUN 8 10/06/2021 0909   CREATININE 0.94 10/06/2021 0909      Component Value Date/Time   CALCIUM 9.7 10/06/2021 0909   ALKPHOS 81 10/06/2021 0909   AST 14 (L) 10/06/2021 0909   ALT 7 10/06/2021 0909   BILITOT 0.6 10/06/2021 0909       Impression and Plan: [name] is a very pleasant 60 yo gentleman with metastatic squamous cell carcinoma of the right lung. Tumor had high PD-L1 score.  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  Hopefully, we will be able to get Interventional Radiology to do the procedure.  I will have to call Cardiology to get them to clear him.   Again, I do not see a problem with him being off anticoagulation for a couple days.  I would like to see him back in about 3 or 4 weeks.  I am not sure if or when we will be able to treat him again.  He really wants to have quality of life.  This really is dictated by his back.  Again, the compression fractures in the back are secondary to him being on steroids.   Volanda Napoleon, MD 8/22/202311:05 AM

## 2021-10-06 NOTE — Telephone Encounter (Signed)
   Pre-operative Risk Assessment    Patient Name: John [name]  DOB: [birth year] MRN: [id number]     Request for Surgical Clearance    Procedure:  Kyphoplasty   Date of Surgery:  Clearance TBD                                 Surgeon:  Dr. Pedro Earls  Surgeon's Group or Practice Name:  [facility]  Phone number:  [phone] Fax number:  [phone]   Type of Clearance Requested:   - Medical  - Pharmacy:  Hold Apixaban (Eliquis) 48 hours before procedure    Type of Anesthesia:  General     SignedMendel Ryder   10/06/2021, 4:53 PM

## 2021-10-07 ENCOUNTER — Telehealth: Payer: Self-pay | Admitting: Pharmacist

## 2021-10-07 LAB — LDL CHOLESTEROL, DIRECT: LDL Direct: 102 mg/dL — ABNORMAL HIGH (ref 0–99)

## 2021-10-07 LAB — TESTOSTERONE: Testosterone: 492 ng/dL (ref 264–916)

## 2021-10-07 MED ORDER — REPATHA SURECLICK 140 MG/ML ~~LOC~~ SOAJ: 1.0000 | SUBCUTANEOUS | 11 refills | Status: DC

## 2021-10-07 NOTE — Telephone Encounter (Signed)
Repatha approved through 10/08/22. Called pt. Reviewed instructions. Rx sent to pharmacy. Pt instructed to get copay card from [email or web address] Will check labs in 3 months.

## 2021-10-07 NOTE — Telephone Encounter (Signed)
Patient with diagnosis of afib on Eliquis for anticoagulation.    Procedure: Kyphoplasty  Date of procedure: TBD   CHA2DS2-VASc Score = 3   This indicates a 3.2% annual risk of stroke. The patient's score is based upon: CHF History: 0 HTN History: 0 Diabetes History: 0 Stroke History: 2 (possible spenic embolic event) Vascular Disease History: 1 Age Score: 0 Gender Score: 0      CrCl 99 ml/min  Per office protocol, patient can hold Eliquis for 3 days prior to procedure.    **This guidance is not considered finalized until pre-operative APP has relayed final recommendations.**

## 2021-10-07 NOTE — Telephone Encounter (Signed)
     Primary Cardiologist: Fransico Him, MD  Chart reviewed as part of pre-operative protocol coverage. Given past medical history and time since last visit, based on ACC/AHA guidelines, John [name] would be at acceptable risk for the planned procedure without further cardiovascular testing.   Case reviewed with Dr. Radford Pax.  Patient is felt to be low risk for redo kyphoplasty.  Patient with diagnosis of afib on Eliquis for anticoagulation.     Procedure: Kyphoplasty  Date of procedure: TBD     CHA2DS2-VASc Score = 3   This indicates a 3.2% annual risk of stroke. The patient's score is based upon: CHF History: 0 HTN History: 0 Diabetes History: 0 Stroke History: 2 (possible spenic embolic event) Vascular Disease History: 1 Age Score: 0 Gender Score: 0       CrCl 99 ml/min   Per office protocol, patient can hold Eliquis for 3 days prior to procedure.  I will route this recommendation to the requesting party via Epic fax function and remove from pre-op pool.  Please call with questions.  Jossie Ng. Ka Flammer NP-C     10/07/2021, 2:18 PM [address] Office [phone] Fax [phone]

## 2021-10-07 NOTE — Telephone Encounter (Signed)
Direct LDL 102. Called pt. He is not taking zetia. Significant improvement in LDL without medication (previously 180). He has muscle pains to atorvastatin 10mg  daily. Repatha previously denied. Patient is interested in trying [place]. Will resubmit with the diagnosis of PAD due to his aortic aneurysm. His previous PA was denied 2 months ago, so hopefully I can submit a new PA.

## 2021-10-08 ENCOUNTER — Other Ambulatory Visit: Payer: Self-pay | Admitting: Radiology

## 2021-10-08 DIAGNOSIS — M549 Dorsalgia, unspecified: Secondary | ICD-10-CM

## 2021-10-09 ENCOUNTER — Encounter (HOSPITAL_COMMUNITY): Payer: Self-pay | Admitting: *Deleted

## 2021-10-09 ENCOUNTER — Other Ambulatory Visit: Payer: Self-pay

## 2021-10-09 ENCOUNTER — Other Ambulatory Visit: Payer: Self-pay | Admitting: Student

## 2021-10-09 NOTE — Progress Notes (Signed)
Spoke with pt for pre-op call. Pt has hx of A-fib, pt's cardiologist is Dr. Radford Pax. Has cardiac clearance in Epic dated 10/06/21. Pt is on Eliquis, was instructed to hold 3 days prior to procedure by Dr. Radford Pax and he states his last dose was 10/08/21 PM dose.  Pt states he does not have HTN or Diabetes.   Shower instructions given to pt and he voiced understanding.

## 2021-10-09 NOTE — H&P (Shared)
  The note originally documented on this encounter has been moved the the encounter in which it belongs.  

## 2021-10-09 NOTE — H&P (Addendum)
Chief Complaint: Patient was seen in consultation today for L1 and L3 compression fractures   Referring Physician(s): [name]  Supervising Physician: Pedro Earls  Patient Status: [facility] - Out-pt  History of Present Illness: John [name] is a 61 y.o. 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   He initially presented to the ED 08/20/21 with complaints of back pain and shortness of breath.   He states he had injured his back at home while bending over. Imaging showed a T12 and L3 compression fracture.   Interventional Radiology was consulted and the patient was approved for a kyphoplasty/vertebroplasty. He was discharged home before IR received insurance authorization.    As an outpatient, he met with Dr. Vernard Gambles 09/03/21 to discuss treatment/management options for T12 and L3 compression fractures/low back pain.   Multiple treatment options including watchful waiting, surgery and kyphoplasty/vertebroplasty were discussed and the patient decided to pursue KP/VP along with vertebral biopsy.   He presented to the [facility] IR department 09/07/21 for this elective outpatient procedure under moderate sedation and Dr. [name] was able to perform a T12 core biopsy with balloon kyphoplasty.   The patient did not tolerate the procedure well under moderate sedation and due to pain/discomfort the decision was made to stop the procedure with plans to reschedule the L3 KP/VP with general anesthesia.   Pathology from the vertebral biopsy returned negative for malignancy.  In the interim, the patient experienced worsening back pain and new imaging showed a new L1 compression fracture.   CT lumbar spine with contrast 10/02/21 IMPRESSION: 1. Acute-subacute L1 vertebral body compression fracture with approximately 80%  height loss new compared with 08/20/2021. 2. Subacute-chronic L3 vertebral body compression fracture similar in appearance to the prior examination of 08/20/2021. 3. Lumbar spine spondylosis as described above. 4. Aortic Atherosclerosis (ICD10-I70.0).  Interventional Radiology was asked to evaluate this patient for an L1 and L3 KP/VP. Imaging reviewed and procedure approved by Dr. [name].   He does continue to c/o severe back pain. No nausea/vomiting. No Fever/chills. ROS negative.   Past Medical History:  Diagnosis Date   Anxiety    Arthritis    Rheumatoid   Atrial fibrillation with RVR ([place]) 05/12/2020   COVID 2020   mild case   Depression    Dyspnea    Family history of adverse reaction to anesthesia    brother with seizures had episode under anesthesia.  Patient has seizures as well.   GERD (gastroesophageal reflux disease)    History of radiation therapy 04/24/2020-05/16/2020   IMRT to right lung     Dr [name]   PAF (paroxysmal atrial fibrillation) ([place])    CHADS2VSAC score 0   Rheumatoid aortitis    Sepsis ([place]) 10/06/2020   Squamous cell lung cancer [facility])     Past Surgical History:  Procedure Laterality Date   BRONCHIAL BRUSHINGS  03/27/2020   Procedure: BRONCHIAL BRUSHINGS;  Surgeon: [name], MD;  Location: [place];  Service: Cardiopulmonary;;   BUBBLE STUDY  10/13/2020   Procedure: BUBBLE STUDY;  Surgeon: Pixie Casino, MD;  Location: [place];  Service: Cardiovascular;;   CARDIOVERSION N/A 10/13/2020   Procedure: CARDIOVERSION;  Surgeon: Pixie Casino, MD;  Location: [facility] ENDOSCOPY;  Service: Cardiovascular;  Laterality: N/A;   CATARACT EXTRACTION  2016   at [place]  03/27/2020   Procedure: FINE NEEDLE ASPIRATION;  Surgeon: [name], MD;  Location: [facility] ENDOSCOPY;  Service: Cardiopulmonary;;   HIP SURGERY Left    IR KYPHO THORACIC WITH BONE BIOPSY  09/07/2021   IR RADIOLOGIST EVAL & MGMT  09/03/2021    TEE WITHOUT CARDIOVERSION N/A 10/13/2020   Procedure: TRANSESOPHAGEAL ECHOCARDIOGRAM (TEE);  Surgeon: Pixie Casino, MD;  Location: [place];  Service: Cardiovascular;  Laterality: N/A;   VIDEO BRONCHOSCOPY WITH ENDOBRONCHIAL ULTRASOUND N/A 03/27/2020   Procedure: VIDEO BRONCHOSCOPY WITH ENDOBRONCHIAL ULTRASOUND;  Surgeon: [name], MD;  Location: [place];  Service: Cardiopulmonary;  Laterality: N/A;    Allergies: Albuterol and Atorvastatin  Medications: Prior to Admission medications   Medication Sig Start Date End Date Taking? Authorizing Provider  amiodarone (PACERONE) 200 MG tablet Take 1 tablet (200 mg total) by mouth daily. 08/24/21   Fenton, Clint R, PA  benzonatate (TESSALON) 200 MG capsule Take 1 capsule (200 mg total) by mouth 3 (three) times daily as needed for cough. 07/23/21   Martyn Ehrich, NP  diclofenac Sodium (VOLTAREN) 1 % GEL Apply 1 Application topically 2 (two) times daily as needed (pain). Patient not taking: Reported on 10/06/2021    [provider]  ELIQUIS 5 MG TABS tablet TAKE 1 TABLET(5 MG) BY MOUTH TWICE DAILY 09/04/21   Fenton, Clint R, PA  ergocalciferol (VITAMIN D2) 1.25 MG (50000 UT) capsule Take 1 capsule (50,000 Units total) by mouth once a week. Patient taking differently: Take 50,000 Units by mouth every Sunday. 09/14/21   Volanda Napoleon, MD  Evolocumab (REPATHA SURECLICK) 027 MG/ML SOAJ Inject 1 Pen into the skin every 14 (fourteen) days. 10/07/21   Sueanne Margarita, MD  HYDROcodone bit-homatropine (HYCODAN) 5-1.5 MG/5ML syrup Take 5 mLs by mouth every 6 (six) hours as needed for cough. Patient not taking: Reported on 10/06/2021 09/08/21   [name], MD  metoprolol succinate (TOPROL-XL) 50 MG 24 hr tablet Take 1 tablet (50 mg total) by mouth daily. Take with or immediately following a meal. 08/08/21 10/09/23  Darliss Cheney, MD  omeprazole (PRILOSEC OTC) 20 MG tablet Take 20 mg by mouth at bedtime.    [provider]   Oxycodone HCl 10 MG TABS Take 1 tablet (10 mg total) by mouth every 6 (six) hours as needed. Patient taking differently: Take 5 mg by mouth every 6 (six) hours as needed. 10/05/21   Volanda Napoleon, MD  PARoxetine (PAXIL) 10 MG tablet Take 10 mg by mouth at bedtime. 01/25/20   [provider]  polyethylene glycol (MIRALAX / GLYCOLAX) 17 g packet Take 17 g by mouth daily as needed for severe constipation. 08/29/21   Charlynne Cousins, MD  predniSONE (DELTASONE) 5 MG tablet Take 5 mg by mouth in the morning. 10/04/21   [provider]  prochlorperazine (COMPAZINE) 10 MG tablet Take 1 tablet (10 mg total) by mouth every 6 (six) hours as needed (Nausea or vomiting). Patient not taking: Reported on 06/29/2021 01/29/21 07/20/21  Volanda Napoleon, MD     Family History  Problem Relation Age of Onset   Arrhythmia Mother        has PPM   Heart disease Father        Died at 20, started in his 62s, heart attacks, had PPM and ICD    Social History   Socioeconomic History   Marital status: Married    Spouse name: Not on file   Number of children:  Not on file   Years of education: Not on file   Highest education level: Not on file  Occupational History   Not on file  Tobacco Use   Smoking status: Former    Packs/day: 1.00    Years: 30.00    Total pack years: 30.00    Types: Cigarettes    Quit date: 2018    Years since quitting: 5.6   Smokeless tobacco: Never   Tobacco comments:    Former smoker 10/28/2020  Vaping Use   Vaping Use: Never used  Substance and Sexual Activity   Alcohol use: Not Currently   Drug use: Never   Sexual activity: Not on file  Other Topics Concern   Not on file  Social History Narrative   ** Merged History Encounter **       Social Determinants of Health   Financial Resource Strain: Not on file  Food Insecurity: Not on file  Transportation Needs: Not on file  Physical Activity: Not on file  Stress: Not on file  Social Connections:  Not on file    Review of Systems: A 12 point ROS discussed and pertinent positives are indicated in the HPI above.  All other systems are negative.  Review of Systems  Vital Signs: BP 122/80 T 98.3 Pulse 75 Resp 18  Physical Exam Vitals reviewed.  Constitutional:      Appearance: Normal appearance.  HENT:     Head: Normocephalic and atraumatic.  Eyes:     Extraocular Movements: Extraocular movements intact.  Cardiovascular:     Rate and Rhythm: Normal rate and regular rhythm.  Pulmonary:     Effort: Pulmonary effort is normal. No respiratory distress.     Breath sounds: Normal breath sounds.  Abdominal:     General: There is no distension.     Palpations: Abdomen is soft.     Tenderness: There is no abdominal tenderness.  Musculoskeletal:        General: Normal range of motion.     Cervical back: Normal range of motion.       Back:     Comments: Pain and tenderness  Skin:    General: Skin is warm and dry.  Neurological:     General: No focal deficit present.     Mental Status: He is alert and oriented to person, place, and time.  Psychiatric:        Mood and Affect: Mood normal.        Behavior: Behavior normal.        Thought Content: Thought content normal.        Judgment: Judgment normal.     Imaging: CT LUMBAR SPINE WO CONTRAST  Result Date: 10/04/2021 CLINICAL DATA:  Lower back pain. EXAM: CT LUMBAR SPINE WITHOUT CONTRAST TECHNIQUE: Multidetector CT imaging of the lumbar spine was performed without intravenous contrast administration. Multiplanar CT image reconstructions were also generated. RADIATION DOSE REDUCTION: This exam was performed according to the departmental dose-optimization program which includes automated exposure control, adjustment of the mA and/or kV according to patient size and/or use of iterative reconstruction technique. COMPARISON:  MRI lumbar spine 08/20/2021 FINDINGS: Segmentation: 5 lumbar type vertebrae. Alignment: No static  listhesis.  Mild kyphosis centered at T12. Vertebrae: No discitis or osteomyelitis. No aggressive osseous lesion. Chronic T12 vertebral body compression fracture status post augmentation with methylmethacrylate within the T12 vertebral body and approximately 60% anterior height loss. Small amount of extruded methylmethacrylate into the T11-12 disc space. Subacute-chronic L3 vertebral body  compression fracture similar in appearance to the prior examination of 08/20/2021. Chronic L4 and L5 vertebral body compression fractures unchanged compared with 08/20/2021. Acute-subacute L1 vertebral body compression fracture with approximately 80% height loss new compared with 08/20/2021. Generalized osteopenia. Paraspinal and other soft tissues: No acute paraspinal abnormality. Abdominal aortic atherosclerosis. Other: None Disc levels: Disc spaces: Disc spaces are preserved. T12-L1: No disc protrusion, foraminal stenosis or central canal stenosis. Mild bilateral facet arthropathy. L1-L2: Mild broad-based disc bulge. Mild bilateral facet arthropathy. No foraminal or central canal stenosis. L2-L3: Mild broad-based disc bulge. No foraminal or central canal stenosis. L3-L4: Mild broad-based disc bulge. Mild bilateral facet arthropathy. Mild spinal stenosis. No foraminal stenosis. L4-L5: Mild broad-based disc bulge. Moderate bilateral facet arthropathy. Moderate spinal stenosis. No foraminal stenosis. L5-S1: No foraminal stenosis. No significant disc protrusion. Moderate left and mild right facet arthropathy. IMPRESSION: 1. Acute-subacute L1 vertebral body compression fracture with approximately 80% height loss new compared with 08/20/2021. 2. Subacute-chronic L3 vertebral body compression fracture similar in appearance to the prior examination of 08/20/2021. 3. Lumbar spine spondylosis as described above. 4. Aortic Atherosclerosis (ICD10-I70.0). Electronically Signed   By: Kathreen Devoid M.D.   On: 10/04/2021 14:00     Labs:  CBC: Recent Labs    09/07/21 0715 09/14/21 0910 10/06/21 0909 10/12/21 0626  WBC 6.5 10.1 9.0 8.3  HGB 11.1* 10.9* 12.6* 12.7*  HCT 34.0* 33.9* 39.6 38.7*  PLT 363 383 303 272    COAGS: Recent Labs    12/22/20 1003 08/20/21 1139 09/07/21 0715 10/12/21 0626  INR 2.2* 1.2 1.1 1.1  APTT 38*  --   --   --     BMP: Recent Labs    08/29/21 0412 09/14/21 0910 10/06/21 0909 10/12/21 0626  NA 138 132* 136 138  K 4.2 4.3 4.1 3.6  CL 99 96* 99 105  CO2 32 _0 GLUCOSE 77 120* 128* 105*  BUN _1 CALCIUM 8.4* 9.3 9.7 8.8*  CREATININE 0.87 0.86 0.94 1.06  GFRNONAA >60 >60 >60 >60    LIVER FUNCTION TESTS: Recent Labs    08/24/21 0422 08/29/21 0412 09/14/21 0910 10/06/21 0909  BILITOT 0.8 0.5 0.7 0.6  AST 15 13* 13* 14*  ALT _2 ALKPHOS 70 74 100 81  PROT 6.2* 6.5 7.1 7.9  ALBUMIN 3.1* 3.1* 3.7 4.0    TUMOR MARKERS: No results for input(s): "AFPTM", "CEA", "CA199", "CHROMGRNA" in the last 8760 hours.  Assessment and Plan:  L1 and L3 compression fractures.  Will proceed with image-guided L1 and L3 kyphoplasty/vertebroplasty.   This procedure will be done under general anesthesia.  Risks and benefits of L1 and L3  kyphoplasty/vertebroplasty were discussed with the patient including, but not limited to education regarding the natural healing process of compression fractures without intervention, bleeding, infection, cement migration which may cause spinal cord damage, paralysis, pulmonary embolism or even death. This interventional procedure involves the use of X-rays and because of the nature of the planned procedure, it is possible that we will have prolonged use of X-ray fluoroscopy. Potential radiation risks to you include (but are not limited to) the following: - A slightly elevated risk for cancer  several years later in life. This risk is typically less than 0.5% percent. This risk is low in comparison to the normal incidence  of human cancer, which is 33% for women and 50% for men according to the [place]. - Radiation induced injury  can include skin redness, resembling a rash, tissue breakdown / ulcers and hair loss (which can be temporary or permanent).  The likelihood of either of these occurring depends on the difficulty of the procedure and whether you are sensitive to radiation due to previous procedures, disease, or genetic conditions.  IF your procedure requires a prolonged use of radiation, you will be notified and given written instructions for further action.  It is your responsibility to monitor the irradiated area for the 2 weeks following the procedure and to notify your physician if you are concerned that you have suffered a radiation induced injury.    All of the patient's questions were answered, patient is agreeable to proceed.  Consent signed and in chart.   Pearla Mckinny S Elhadj Girton PA-C 10/12/2021 8:10 AM      I spent a total of  25 minutes  in face to face in clinical consultation, greater than 50% of which was counseling/coordinating care for L1 and L3 kyphoplasty

## 2021-10-11 NOTE — Anesthesia Preprocedure Evaluation (Signed)
Anesthesia Evaluation  Patient identified by MRN, date of birth, ID band Patient awake    Reviewed: Allergy & Precautions, NPO status , Patient's Chart, lab work & pertinent test results  History of Anesthesia Complications (+) Family history of anesthesia reaction  Airway Mallampati: III  TM Distance: >3 FB Neck ROM: Full    Dental  (+) Dental Advisory Given, Teeth Intact   Pulmonary shortness of breath, asthma , COPD, former smoker,    Pulmonary exam normal breath sounds clear to auscultation       Cardiovascular hypertension, Pt. on home beta blockers + CAD  Normal cardiovascular exam Rhythm:Regular Rate:Normal  Echo 07/2021 1. Left ventricular ejection fraction, by estimation, is 60 to 65%. The left ventricle has normal function. Left ventricular endocardial border not optimally defined to evaluate regional wall motion even with definity  contrast. Left ventricular diastolic function could not be evaluated.  2. Right ventricular systolic function was not well visualized. The right ventricular size is not well visualized. Tricuspid regurgitation signal is inadequate for assessing PA pressure.  3. The mitral valve is normal in structure. No evidence of mitral valve regurgitation. No evidence of mitral stenosis.  4. The aortic valve is normal in structure. Aortic valve regurgitation is not visualized. No aortic stenosis is present.  5. Aortic dilatation noted. There is mild dilatation of the aortic root, measuring 40 mm.  6. The inferior vena cava is normal in size with greater than 50% respiratory variability, suggesting right atrial pressure of 3 mmHg.    Neuro/Psych Seizures -,  PSYCHIATRIC DISORDERS Anxiety Depression    GI/Hepatic Neg liver ROS, GERD  ,  Endo/Other  negative endocrine ROS  Renal/GU negative Renal ROS     Musculoskeletal  (+) Arthritis ,   Abdominal   Peds  Hematology  (+) Blood dyscrasia,  anemia ,   Anesthesia Other Findings   Reproductive/Obstetrics                           Anesthesia Physical Anesthesia Plan  ASA: 3  Anesthesia Plan: General   Post-op Pain Management: Tylenol PO (pre-op)*   Induction: Intravenous  PONV Risk Score and Plan: 2 and Ondansetron, Dexamethasone, Midazolam and Treatment may vary due to age or medical condition  Airway Management Planned: Oral ETT  Additional Equipment: None  Intra-op Plan:   Post-operative Plan: Extubation in OR  Informed Consent: I have reviewed the patients History and Physical, chart, labs and discussed the procedure including the risks, benefits and alternatives for the proposed anesthesia with the patient or authorized representative who has indicated his/her understanding and acceptance.     Dental advisory given  Plan Discussed with: CRNA  Anesthesia Plan Comments:        Anesthesia Quick Evaluation

## 2021-10-12 ENCOUNTER — Ambulatory Visit (HOSPITAL_BASED_OUTPATIENT_CLINIC_OR_DEPARTMENT_OTHER): Payer: 59 | Admitting: Anesthesiology

## 2021-10-12 ENCOUNTER — Ambulatory Visit (HOSPITAL_COMMUNITY)
Admission: RE | Admit: 2021-10-12 | Discharge: 2021-10-12 | Disposition: A | Discharge status: Home / Self Care | Payer: 59 | Attending: Neuroradiology | Admitting: Neuroradiology

## 2021-10-12 ENCOUNTER — Other Ambulatory Visit: Payer: Self-pay

## 2021-10-12 ENCOUNTER — Ambulatory Visit (HOSPITAL_COMMUNITY)
Admission: RE | Admit: 2021-10-12 | Discharge: 2021-10-12 | Disposition: A | Discharge status: Home / Self Care | Payer: 59 | Source: Ambulatory Visit | Attending: Neuroradiology | Admitting: Neuroradiology

## 2021-10-12 ENCOUNTER — Encounter (HOSPITAL_COMMUNITY): Payer: Self-pay

## 2021-10-12 ENCOUNTER — Encounter (HOSPITAL_COMMUNITY)
Admission: RE | Disposition: A | Discharge status: Home / Self Care | Payer: Self-pay | Source: Home / Self Care | Attending: Neuroradiology

## 2021-10-12 ENCOUNTER — Ambulatory Visit: Payer: 59 | Admitting: Radiation Oncology

## 2021-10-12 ENCOUNTER — Ambulatory Visit (HOSPITAL_COMMUNITY): Payer: 59 | Admitting: Anesthesiology

## 2021-10-12 DIAGNOSIS — I2510 Atherosclerotic heart disease of native coronary artery without angina pectoris: Secondary | ICD-10-CM | POA: Diagnosis not present

## 2021-10-12 DIAGNOSIS — M8008XA Age-related osteoporosis with current pathological fracture, vertebra(e), initial encounter for fracture: Secondary | ICD-10-CM | POA: Insufficient documentation

## 2021-10-12 DIAGNOSIS — I10 Essential (primary) hypertension: Secondary | ICD-10-CM | POA: Diagnosis not present

## 2021-10-12 DIAGNOSIS — Z87891 Personal history of nicotine dependence: Secondary | ICD-10-CM

## 2021-10-12 DIAGNOSIS — F418 Other specified anxiety disorders: Secondary | ICD-10-CM | POA: Diagnosis not present

## 2021-10-12 DIAGNOSIS — M8448XA Pathological fracture, other site, initial encounter for fracture: Secondary | ICD-10-CM | POA: Diagnosis present

## 2021-10-12 DIAGNOSIS — M545 Low back pain, unspecified: Secondary | ICD-10-CM | POA: Insufficient documentation

## 2021-10-12 DIAGNOSIS — Z79899 Other long term (current) drug therapy: Secondary | ICD-10-CM | POA: Insufficient documentation

## 2021-10-12 DIAGNOSIS — C7971 Secondary malignant neoplasm of right adrenal gland: Secondary | ICD-10-CM | POA: Insufficient documentation

## 2021-10-12 DIAGNOSIS — J449 Chronic obstructive pulmonary disease, unspecified: Secondary | ICD-10-CM | POA: Insufficient documentation

## 2021-10-12 DIAGNOSIS — M549 Dorsalgia, unspecified: Secondary | ICD-10-CM

## 2021-10-12 DIAGNOSIS — S32010A Wedge compression fracture of first lumbar vertebra, initial encounter for closed fracture: Secondary | ICD-10-CM

## 2021-10-12 DIAGNOSIS — M8088XA Other osteoporosis with current pathological fracture, vertebra(e), initial encounter for fracture: Secondary | ICD-10-CM

## 2021-10-12 DIAGNOSIS — C3490 Malignant neoplasm of unspecified part of unspecified bronchus or lung: Secondary | ICD-10-CM | POA: Insufficient documentation

## 2021-10-12 DIAGNOSIS — S32030A Wedge compression fracture of third lumbar vertebra, initial encounter for closed fracture: Secondary | ICD-10-CM

## 2021-10-12 DIAGNOSIS — M069 Rheumatoid arthritis, unspecified: Secondary | ICD-10-CM | POA: Diagnosis not present

## 2021-10-12 DIAGNOSIS — K219 Gastro-esophageal reflux disease without esophagitis: Secondary | ICD-10-CM | POA: Insufficient documentation

## 2021-10-12 HISTORY — DX: Anxiety disorder, unspecified: F41.9

## 2021-10-12 HISTORY — PX: IR KYPHO EA ADDL LEVEL THORACIC OR LUMBAR: IMG5520

## 2021-10-12 HISTORY — PX: IR KYPHO LUMBAR INC FX REDUCE BONE BX UNI/BIL CANNULATION INC/IMAGING: IMG5519

## 2021-10-12 HISTORY — PX: RADIOLOGY WITH ANESTHESIA: SHX6223

## 2021-10-12 HISTORY — DX: Depression, unspecified: F32.A

## 2021-10-12 HISTORY — DX: Unspecified osteoarthritis, unspecified site: M19.90

## 2021-10-12 LAB — CBC
HCT: 38.7 % — ABNORMAL LOW (ref 39.0–52.0)
Hemoglobin: 12.7 g/dL — ABNORMAL LOW (ref 13.0–17.0)
MCH: 28.9 pg (ref 26.0–34.0)
MCHC: 32.8 g/dL (ref 30.0–36.0)
MCV: 88.2 fL (ref 80.0–100.0)
Platelets: 272 10*3/uL (ref 150–400)
RBC: 4.39 MIL/uL (ref 4.22–5.81)
RDW: 15.6 % — ABNORMAL HIGH (ref 11.5–15.5)
WBC: 8.3 10*3/uL (ref 4.0–10.5)
nRBC: 0 % (ref 0.0–0.2)

## 2021-10-12 LAB — BASIC METABOLIC PANEL
Anion gap: 10 (ref 5–15)
BUN: 7 mg/dL (ref 6–20)
CO2: 23 mmol/L (ref 22–32)
Calcium: 8.8 mg/dL — ABNORMAL LOW (ref 8.9–10.3)
Chloride: 105 mmol/L (ref 98–111)
Creatinine, Ser: 1.06 mg/dL (ref 0.61–1.24)
GFR, Estimated: >60 mL/min (ref >60)
Glucose, Bld: 105 mg/dL — ABNORMAL HIGH (ref 70–99)
Potassium: 3.6 mmol/L (ref 3.5–5.1)
Sodium: 138 mmol/L (ref 135–145)

## 2021-10-12 LAB — SURGICAL PCR SCREEN
MRSA, PCR: NEGATIVE
Staphylococcus aureus: NEGATIVE

## 2021-10-12 LAB — PROTIME-INR
INR: 1.1 (ref 0.8–1.2)
Prothrombin Time: 13.9 seconds (ref 11.4–15.2)

## 2021-10-12 SURGERY — IR WITH ANESTHESIA
Anesthesia: General

## 2021-10-12 MED ORDER — BUPIVACAINE HCL 0.25 % IJ SOLN
INTRAMUSCULAR | Status: AC | PRN
  Administered 2021-10-12: 20 mL

## 2021-10-12 MED ORDER — PROPOFOL 10 MG/ML IV BOLUS
INTRAVENOUS | Status: DC | PRN
  Administered 2021-10-12: 150 mg via INTRAVENOUS

## 2021-10-12 MED ORDER — ACETAMINOPHEN 500 MG PO TABS
1000.0000 mg | ORAL_TABLET | Freq: Once | ORAL | Status: DC
  Filled 2021-10-12: qty 2

## 2021-10-12 MED ORDER — SUGAMMADEX SODIUM 200 MG/2ML IV SOLN
INTRAVENOUS | Status: DC | PRN
  Administered 2021-10-12: 200 mg via INTRAVENOUS

## 2021-10-12 MED ORDER — EPHEDRINE SULFATE-NACL 50-0.9 MG/10ML-% IV SOSY
PREFILLED_SYRINGE | INTRAVENOUS | Status: DC | PRN
  Administered 2021-10-12: 10 mg via INTRAVENOUS

## 2021-10-12 MED ORDER — ACETAMINOPHEN 325 MG PO TABS: 650.0000 mg | ORAL_TABLET | Freq: Four times a day (QID) | ORAL | Status: DC | PRN

## 2021-10-12 MED ORDER — FENTANYL CITRATE (PF) 250 MCG/5ML IJ SOLN
INTRAMUSCULAR | Status: DC | PRN
  Administered 2021-10-12: 100 ug via INTRAVENOUS

## 2021-10-12 MED ORDER — PHENYLEPHRINE HCL-NACL 20-0.9 MG/250ML-% IV SOLN
INTRAVENOUS | Status: DC | PRN
  Administered 2021-10-12: 50 ug/min via INTRAVENOUS

## 2021-10-12 MED ORDER — MEPERIDINE HCL 25 MG/ML IJ SOLN: 6.2500 mg | INTRAMUSCULAR | Status: DC | PRN

## 2021-10-12 MED ORDER — LACTATED RINGERS IV SOLN: INTRAVENOUS | Status: DC

## 2021-10-12 MED ORDER — BUPIVACAINE HCL (PF) 0.5 % IJ SOLN
INTRAMUSCULAR | Status: AC
  Filled 2021-10-12: qty 30

## 2021-10-12 MED ORDER — HYDROMORPHONE HCL 1 MG/ML IJ SOLN: 0.2500 mg | INTRAMUSCULAR | Status: DC | PRN

## 2021-10-12 MED ORDER — LIDOCAINE HCL (PF) 1 % IJ SOLN
INTRAMUSCULAR | Status: AC | PRN
  Administered 2021-10-12: 20 mL

## 2021-10-12 MED ORDER — ROCURONIUM BROMIDE 10 MG/ML (PF) SYRINGE
PREFILLED_SYRINGE | INTRAVENOUS | Status: DC | PRN
  Administered 2021-10-12: 60 mg via INTRAVENOUS

## 2021-10-12 MED ORDER — CEFAZOLIN SODIUM-DEXTROSE 2-4 GM/100ML-% IV SOLN
2.0000 g | INTRAVENOUS | Status: AC
  Administered 2021-10-12: 2 g via INTRAVENOUS
  Filled 2021-10-12: qty 100

## 2021-10-12 MED ORDER — ONDANSETRON HCL 4 MG/2ML IJ SOLN
INTRAMUSCULAR | Status: DC | PRN
  Administered 2021-10-12: 4 mg via INTRAVENOUS

## 2021-10-12 MED ORDER — HYDROCODONE-ACETAMINOPHEN 5-325 MG PO TABS: 1.0000 | ORAL_TABLET | Freq: Four times a day (QID) | ORAL | Status: DC | PRN

## 2021-10-12 MED ORDER — LIDOCAINE 2% (20 MG/ML) 5 ML SYRINGE
INTRAMUSCULAR | Status: DC | PRN
  Administered 2021-10-12: 100 mg via INTRAVENOUS

## 2021-10-12 MED ORDER — SODIUM CHLORIDE 0.9 % IV SOLN: INTRAVENOUS | Status: DC

## 2021-10-12 MED ORDER — DEXAMETHASONE SODIUM PHOSPHATE 10 MG/ML IJ SOLN
INTRAMUSCULAR | Status: DC | PRN
  Administered 2021-10-12: 10 mg via INTRAVENOUS

## 2021-10-12 MED ORDER — CHLORHEXIDINE GLUCONATE 0.12 % MT SOLN
15.0000 mL | Freq: Once | OROMUCOSAL | Status: AC
  Administered 2021-10-12: 15 mL via OROMUCOSAL
  Filled 2021-10-12: qty 15

## 2021-10-12 MED ORDER — IOHEXOL 300 MG/ML  SOLN
100.0000 mL | Freq: Once | INTRAMUSCULAR | Status: AC | PRN
  Administered 2021-10-12: 20 mL

## 2021-10-12 MED ORDER — ORAL CARE MOUTH RINSE: 15.0000 mL | Freq: Once | OROMUCOSAL | Status: AC

## 2021-10-12 MED ORDER — LIDOCAINE HCL 1 % IJ SOLN
INTRAMUSCULAR | Status: AC
  Filled 2021-10-12: qty 20

## 2021-10-12 MED ORDER — PROMETHAZINE HCL 25 MG/ML IJ SOLN: 6.2500 mg | INTRAMUSCULAR | Status: DC | PRN

## 2021-10-12 MED ORDER — PHENYLEPHRINE 80 MCG/ML (10ML) SYRINGE FOR IV PUSH (FOR BLOOD PRESSURE SUPPORT)
PREFILLED_SYRINGE | INTRAVENOUS | Status: DC | PRN
  Administered 2021-10-12: 160 ug via INTRAVENOUS
  Administered 2021-10-12 (×2): 80 ug via INTRAVENOUS
  Administered 2021-10-12: 160 ug via INTRAVENOUS

## 2021-10-12 NOTE — Transfer of Care (Signed)
Immediate Anesthesia Transfer of Care Note  Patient: John [name]  Procedure(s) Performed: L1 and L3 Kyphoplasty  Patient Location: PACU  Anesthesia Type:General  Level of Consciousness: awake, alert  and oriented  Airway & Oxygen Therapy: Patient Spontanous Breathing and Patient connected to nasal cannula oxygen  Post-op Assessment: Report given to RN and Post -op Vital signs reviewed and stable  Post vital signs: Reviewed and stable  Last Vitals:  Vitals Value Taken Time  BP 114/78 10/12/21 1101  Temp    Pulse 79 10/12/21 1104  Resp 16 10/12/21 1104  SpO2 94 % 10/12/21 1104  Vitals shown include unvalidated device data.  Last Pain:  Vitals:   10/12/21 0652  TempSrc:   PainSc: 2          Complications: No notable events documented.

## 2021-10-12 NOTE — Anesthesia Postprocedure Evaluation (Signed)
Anesthesia Post Note  Patient: John [name]  Procedure(s) Performed: L1 and L3 Kyphoplasty     Patient location during evaluation: PACU Anesthesia Type: General Level of consciousness: sedated and patient cooperative Pain management: pain level controlled Vital Signs Assessment: post-procedure vital signs reviewed and stable Respiratory status: spontaneous breathing Cardiovascular status: stable Anesthetic complications: no   No notable events documented.  Last Vitals:  Vitals:   10/12/21 1200 10/12/21 1215  BP: 103/74 101/69  Pulse: 78 80  Resp: 18 16  Temp:    SpO2: 90% 90%    Last Pain:  Vitals:   10/12/21 1215  TempSrc:   PainSc: 2                  Nolon Nations

## 2021-10-12 NOTE — Anesthesia Procedure Notes (Signed)
Procedure Name: Intubation Date/Time: 10/12/2021 8:52 AM  Performed by: Anastasio Auerbach, CRNAPre-anesthesia Checklist: Patient identified, Emergency Drugs available, Suction available and Patient being monitored Patient Re-evaluated:Patient Re-evaluated prior to induction Oxygen Delivery Method: Circle system utilized Preoxygenation: Pre-oxygenation with 100% oxygen Induction Type: IV induction Ventilation: Mask ventilation without difficulty Laryngoscope Size: Mac and 3 Grade View: Grade I Tube type: Oral Number of attempts: 1 Airway Equipment and Method: Stylet and Oral airway Placement Confirmation: ETT inserted through vocal cords under direct vision, positive ETCO2 and breath sounds checked- equal and bilateral Secured at: 22 cm Tube secured with: Tape Dental Injury: Teeth and Oropharynx as per pre-operative assessment

## 2021-10-12 NOTE — Procedures (Signed)
INTERVENTIONAL NEURORADIOLOGY BRIEF POSTPROCEDURE NOTE  FLUOROSCOPY GUIDED L1 AND L3 CORE BONE BIOPSY AND BALLOON KYPHOPLASTY    Attending: Dr. 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    Medication used: Refer to anesthesia documentation.   Complications: None.    Estimated blood loss: Negligible.    Specimen: L1 and L3 core biopsies sent for tissue exam.    Findings: L1 and L3 compression fractures again seen.  A core biopsy sample was obtained for each level.  Balloon kyphoplasty performed with bilateral transpedicular approach.    The patient tolerated the procedure well without incident or complication and is in stable condition.

## 2021-10-13 ENCOUNTER — Other Ambulatory Visit (HOSPITAL_COMMUNITY): Payer: Self-pay | Admitting: Neuroradiology

## 2021-10-13 ENCOUNTER — Encounter (HOSPITAL_COMMUNITY): Payer: Self-pay | Admitting: Neuroradiology

## 2021-10-13 ENCOUNTER — Telehealth: Payer: Self-pay | Admitting: *Deleted

## 2021-10-13 DIAGNOSIS — S32030A Wedge compression fracture of third lumbar vertebra, initial encounter for closed fracture: Secondary | ICD-10-CM

## 2021-10-13 DIAGNOSIS — S32010A Wedge compression fracture of first lumbar vertebra, initial encounter for closed fracture: Secondary | ICD-10-CM

## 2021-10-13 NOTE — Telephone Encounter (Signed)
CALLED PATIENT TO ASK ABOUT RESCHEDULING FU ON 10-22-21 DUE TO DR. KINARD BEING ON VACATION, SPOKE WITH PATIENT AND HE AGREED TO COME ON 10-26-21 @ 11 AM

## 2021-10-15 ENCOUNTER — Ambulatory Visit: Payer: 59 | Admitting: Emergency Medicine

## 2021-10-15 LAB — SURGICAL PATHOLOGY

## 2021-10-21 ENCOUNTER — Encounter: Payer: Self-pay | Admitting: Radiation Oncology

## 2021-10-22 ENCOUNTER — Ambulatory Visit: Payer: 59 | Admitting: Radiation Oncology

## 2021-10-23 ENCOUNTER — Ambulatory Visit (HOSPITAL_COMMUNITY)
Admission: RE | Admit: 2021-10-23 | Discharge: 2021-10-23 | Disposition: A | Discharge status: Home / Self Care | Payer: 59 | Source: Ambulatory Visit | Attending: Neuroradiology | Admitting: Neuroradiology

## 2021-10-23 DIAGNOSIS — S32030A Wedge compression fracture of third lumbar vertebra, initial encounter for closed fracture: Secondary | ICD-10-CM

## 2021-10-23 DIAGNOSIS — S32010A Wedge compression fracture of first lumbar vertebra, initial encounter for closed fracture: Secondary | ICD-10-CM

## 2021-10-23 NOTE — Progress Notes (Signed)
Referring Physician(s): de Macedo Rodrigues,Lequisha Cammack  Chief Complaint: The patient is seen in follow up today s/p L1 and L3 core bone biopsy and balloon kyphoplasty.  History of present illness:  John [name] is a 61 y.o. 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  He presented to the ED 08/20/21 with complaints of back pain and shortness of breath when he was found to have T12 and L3 compression fractures.  He presented for fluoroscopy guided kyphoplasty of the T12 and L3 compression fractures on 09/07/2021.  T12 core biopsy and balloon kyphoplasty was performed under moderate sedation.  However, due to inadequate sedation and pain control despite increased medications, patient was unable to tolerate continuing the procedure with the L3 kyphoplasty.  At that time, a new compression fracture of the L1 vertebral body was also noted, correlating with sharp increase in pain level.  He returned for fluoroscopy guided kyphoplasty of the L1 and L3 vertebral bodies under general anaesthesia on 10/12/2021.  The procedure was uneventful and he returns today for follow-up.    Patient refers significant improvement of the back pain since the procedure was performed, now only with some mild pain while moving.  He refer some difficulty related to the fact that he was mostly bed bound for 2 months and he wants to start doing things slowly.  He was able to shower without help yesterday.  He has no new complaint.  While questioning about osteoporosis treatment, he informed me that he has not been able to find a new primary care physician that will take him as a new patient due to the complexity of his care.   Past Medical History:  Diagnosis Date   Anxiety    Arthritis    Rheumatoid   Atrial fibrillation with RVR ([place]) 05/12/2020   COVID 2020   mild case    Depression    Dyspnea    Family history of adverse reaction to anesthesia    brother with seizures had episode under anesthesia.  Patient has seizures as well.   GERD (gastroesophageal reflux disease)    History of radiation therapy 04/24/2020-05/16/2020   IMRT to right lung     Dr [name]   History of radiation therapy    08/10/21-08/19/21-Dr. [name]   PAF (paroxysmal atrial fibrillation) ([place])    CHADS2VSAC score 0   Rheumatoid aortitis    Sepsis ([place]) 10/06/2020   Squamous cell lung cancer [facility])     Past Surgical History:  Procedure Laterality Date   BRONCHIAL BRUSHINGS  03/27/2020   Procedure: BRONCHIAL BRUSHINGS;  Surgeon: [name], MD;  Location: [place];  Service: Cardiopulmonary;;   BUBBLE STUDY  10/13/2020   Procedure: BUBBLE STUDY;  Surgeon: Pixie Casino, MD;  Location: [place];  Service: Cardiovascular;;   CARDIOVERSION N/A 10/13/2020   Procedure: CARDIOVERSION;  Surgeon: Pixie Casino, MD;  Location: [facility] ENDOSCOPY;  Service: Cardiovascular;  Laterality: N/A;   CATARACT EXTRACTION  2016   at [place]  03/27/2020   Procedure: FINE NEEDLE ASPIRATION;  Surgeon: [name], MD;  Location: [place];  Service: Cardiopulmonary;;   HIP SURGERY Left    IR KYPHO EA ADDL LEVEL THORACIC OR LUMBAR  10/12/2021   IR KYPHO LUMBAR INC FX REDUCE BONE BX UNI/BIL CANNULATION INC/IMAGING  10/12/2021   IR KYPHO THORACIC WITH BONE BIOPSY  09/07/2021  IR RADIOLOGIST EVAL & MGMT  09/03/2021   RADIOLOGY WITH ANESTHESIA N/A 10/12/2021   Procedure: L1 and L3 Kyphoplasty;  Surgeon: Pedro Earls, MD;  Location: [place];  Service: Radiology;  Laterality: N/A;   TEE WITHOUT CARDIOVERSION N/A 10/13/2020   Procedure: TRANSESOPHAGEAL ECHOCARDIOGRAM (TEE);  Surgeon: Pixie Casino, MD;  Location: [place];  Service: Cardiovascular;  Laterality: N/A;   VIDEO BRONCHOSCOPY WITH ENDOBRONCHIAL ULTRASOUND N/A 03/27/2020   Procedure:  VIDEO BRONCHOSCOPY WITH ENDOBRONCHIAL ULTRASOUND;  Surgeon: [name], MD;  Location: [place];  Service: Cardiopulmonary;  Laterality: N/A;    Allergies: Albuterol and Atorvastatin  Medications: Prior to Admission medications   Medication Sig Start Date End Date Taking? Authorizing Provider  amiodarone (PACERONE) 200 MG tablet Take 1 tablet (200 mg total) by mouth daily. 08/24/21   Fenton, Clint R, PA  benzonatate (TESSALON) 200 MG capsule Take 1 capsule (200 mg total) by mouth 3 (three) times daily as needed for cough. 07/23/21   Martyn Ehrich, NP  diclofenac Sodium (VOLTAREN) 1 % GEL Apply 1 Application topically 2 (two) times daily as needed (pain). Patient not taking: Reported on 10/06/2021    [provider]  ELIQUIS 5 MG TABS tablet TAKE 1 TABLET(5 MG) BY MOUTH TWICE DAILY 09/04/21   Fenton, Clint R, PA  ergocalciferol (VITAMIN D2) 1.25 MG (50000 UT) capsule Take 1 capsule (50,000 Units total) by mouth once a week. Patient taking differently: Take 50,000 Units by mouth every Sunday. 09/14/21   Volanda Napoleon, MD  Evolocumab (REPATHA SURECLICK) 462 MG/ML SOAJ Inject 1 Pen into the skin every 14 (fourteen) days. 10/07/21   Sueanne Margarita, MD  HYDROcodone bit-homatropine (HYCODAN) 5-1.5 MG/5ML syrup Take 5 mLs by mouth every 6 (six) hours as needed for cough. Patient not taking: Reported on 10/06/2021 09/08/21   [name], MD  metoprolol succinate (TOPROL-XL) 50 MG 24 hr tablet Take 1 tablet (50 mg total) by mouth daily. Take with or immediately following a meal. 08/08/21 10/09/23  Darliss Cheney, MD  omeprazole (PRILOSEC OTC) 20 MG tablet Take 20 mg by mouth at bedtime.    [provider]  Oxycodone HCl 10 MG TABS Take 1 tablet (10 mg total) by mouth every 6 (six) hours as needed. Patient taking differently: Take 5 mg by mouth every 6 (six) hours as needed. 10/05/21   Volanda Napoleon, MD  PARoxetine (PAXIL) 10 MG tablet Take 10 mg by mouth at bedtime.  01/25/20   [provider]  polyethylene glycol (MIRALAX / GLYCOLAX) 17 g packet Take 17 g by mouth daily as needed for severe constipation. 08/29/21   Charlynne Cousins, MD  predniSONE (DELTASONE) 5 MG tablet Take 5 mg by mouth in the morning. 10/04/21   [provider]  prochlorperazine (COMPAZINE) 10 MG tablet Take 1 tablet (10 mg total) by mouth every 6 (six) hours as needed (Nausea or vomiting). Patient not taking: Reported on 06/29/2021 01/29/21 07/20/21  Volanda Napoleon, MD     Family History  Problem Relation Age of Onset   Arrhythmia Mother        has PPM   Heart disease Father        Died at 78, started in his 76s, heart attacks, had PPM and ICD    Social History   Socioeconomic History   Marital status: Married    Spouse name: Not on file   Number of children: Not on file   Years of  education: Not on file   Highest education level: Not on file  Occupational History   Not on file  Tobacco Use   Smoking status: Former    Packs/day: 1.00    Years: 30.00    Total pack years: 30.00    Types: Cigarettes    Quit date: 2018    Years since quitting: 5.6   Smokeless tobacco: Never   Tobacco comments:    Former smoker 10/28/2020  Vaping Use   Vaping Use: Never used  Substance and Sexual Activity   Alcohol use: Not Currently   Drug use: Never   Sexual activity: Not on file  Other Topics Concern   Not on file  Social History Narrative   ** Merged History Encounter **       Social Determinants of Health   Financial Resource Strain: Not on file  Food Insecurity: Not on file  Transportation Needs: Not on file  Physical Activity: Not on file  Stress: Not on file  Social Connections: Not on file     Vital Signs: There were no vitals taken for this visit.  Physical Exam Constitutional:      Interventions: Nasal cannula in place.  Pulmonary:     Effort: Pulmonary effort is normal.  Musculoskeletal:       Back:  Neurological:     Mental  Status: He is alert.  Psychiatric:        Behavior: Behavior is cooperative.     Imaging: No results found.  Labs:  CBC: Recent Labs    09/07/21 0715 09/14/21 0910 10/06/21 0909 10/12/21 0626  WBC 6.5 10.1 9.0 8.3  HGB 11.1* 10.9* 12.6* 12.7*  HCT 34.0* 33.9* 39.6 38.7*  PLT 363 383 303 272    COAGS: Recent Labs    12/22/20 1003 08/20/21 1139 09/07/21 0715 10/12/21 0626  INR 2.2* 1.2 1.1 1.1  APTT 38*  --   --   --     BMP: Recent Labs    08/29/21 0412 09/14/21 0910 10/06/21 0909 10/12/21 0626  NA 138 132* 136 138  K 4.2 4.3 4.1 3.6  CL 99 96* 99 105  CO2 32 27 27 23   GLUCOSE 77 120* 128* 105*  BUN 18 8 8 7   CALCIUM 8.4* 9.3 9.7 8.8*  CREATININE 0.87 0.86 0.94 1.06  GFRNONAA >60 >60 >60 >60    LIVER FUNCTION TESTS: Recent Labs    08/24/21 0422 08/29/21 0412 09/14/21 0910 10/06/21 0909  BILITOT 0.8 0.5 0.7 0.6  AST 15 13* 13* 14*  ALT 18 17 9 7   ALKPHOS 70 74 100 81  PROT 6.2* 6.5 7.1 7.9  ALBUMIN 3.1* 3.1* 3.7 4.0    Assessment:  Mr. Pak has done very after kyphoplasty with adequate response to treatment. No new back pain or access site complication noted. He has had no difficulty and is slowly starting to do things independently. Core biopsy samples collected were all negative for malignancy suggesting osteoporotic fragility fractures.   Signed: Pedro Earls, MD 10/23/2021, 11:59 AM    I spent a total of    15 Minutes in face to face in clinical consultation, greater than 50% of which was counseling/coordinating care for osteoporotic fragility fractures status post kyphplasty.

## 2021-10-23 NOTE — Progress Notes (Incomplete)
  Radiation Oncology         [phone] ________________________________  Name: John [name] MRN: [id number]  Date: 08/19/2021  DOB: April 24, 1960  End of Treatment Note  Diagnosis:   The primary encounter diagnosis was Metastasis from malignant neoplasm of right lung ([place]). 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 Prior to this injury, the patient tolerated radiation therapy relatively well other than mild-moderate right flank pain, presumably associated with his renal mass.   Plan: The patient has completed radiation treatment. The patient will return to radiation oncology clinic for routine followup in one month. I advised them to call or return sooner if they have any questions or concerns related to their recovery or treatment.  -----------------------------------  Blair Promise, PhD, MD  This document serves as a record of services personally performed by [name], MD. It was created on his behalf by Roney Mans, a trained medical scribe. The creation of this record is based on the scribe's personal observations and the provider's statements to them. This document has been checked and approved by the attending provider.

## 2021-10-26 ENCOUNTER — Ambulatory Visit
Admission: RE | Admit: 2021-10-26 | Discharge: 2021-10-26 | Disposition: A | Discharge status: Home / Self Care | Payer: 59 | Source: Ambulatory Visit | Attending: Radiation Oncology | Admitting: Radiation Oncology

## 2021-10-26 ENCOUNTER — Telehealth (HOSPITAL_COMMUNITY): Payer: Self-pay

## 2021-10-26 ENCOUNTER — Telehealth: Payer: Self-pay | Admitting: *Deleted

## 2021-10-26 NOTE — Telephone Encounter (Signed)
Received a message from Dr. Debbrah Alar that the patient was having trouble finding a PCP. He kept getting refused service because of his complex care. I have given him the number to Internal Medicine downstairs to get in with them. AW

## 2021-10-26 NOTE — Telephone Encounter (Signed)
CALLED PATIENT TO ASK ABOUT RESCHEDULING MISSED FU APPT. ON 10-26-21, PATIENT STATED THAT HE HAS HAD 2 BACK SURGERIES AND HE WILL SEE HIS MED/ONC ON 11-09-21, PATIENT STATED THAT HE DOESN'T WANT TO SEE DR. [name] UNTIL AFTER HE SEES HIS MED/ONC DOCTOR, PATIENT AGREED TO SEE DR. [place] ON 11-12-21 @ 4:30 PM

## 2021-11-01 IMAGING — DX DG ORBITS FOR FOREIGN BODY
2 series · 2 of 2 positions shown · non-contrast
Comparison: None.

CLINICAL DATA: Metal working/exposure; clearance prior to MRI

EXAM:
ORBITS FOR FOREIGN BODY - 2 VIEW

[orbits waters (1 of 2)]
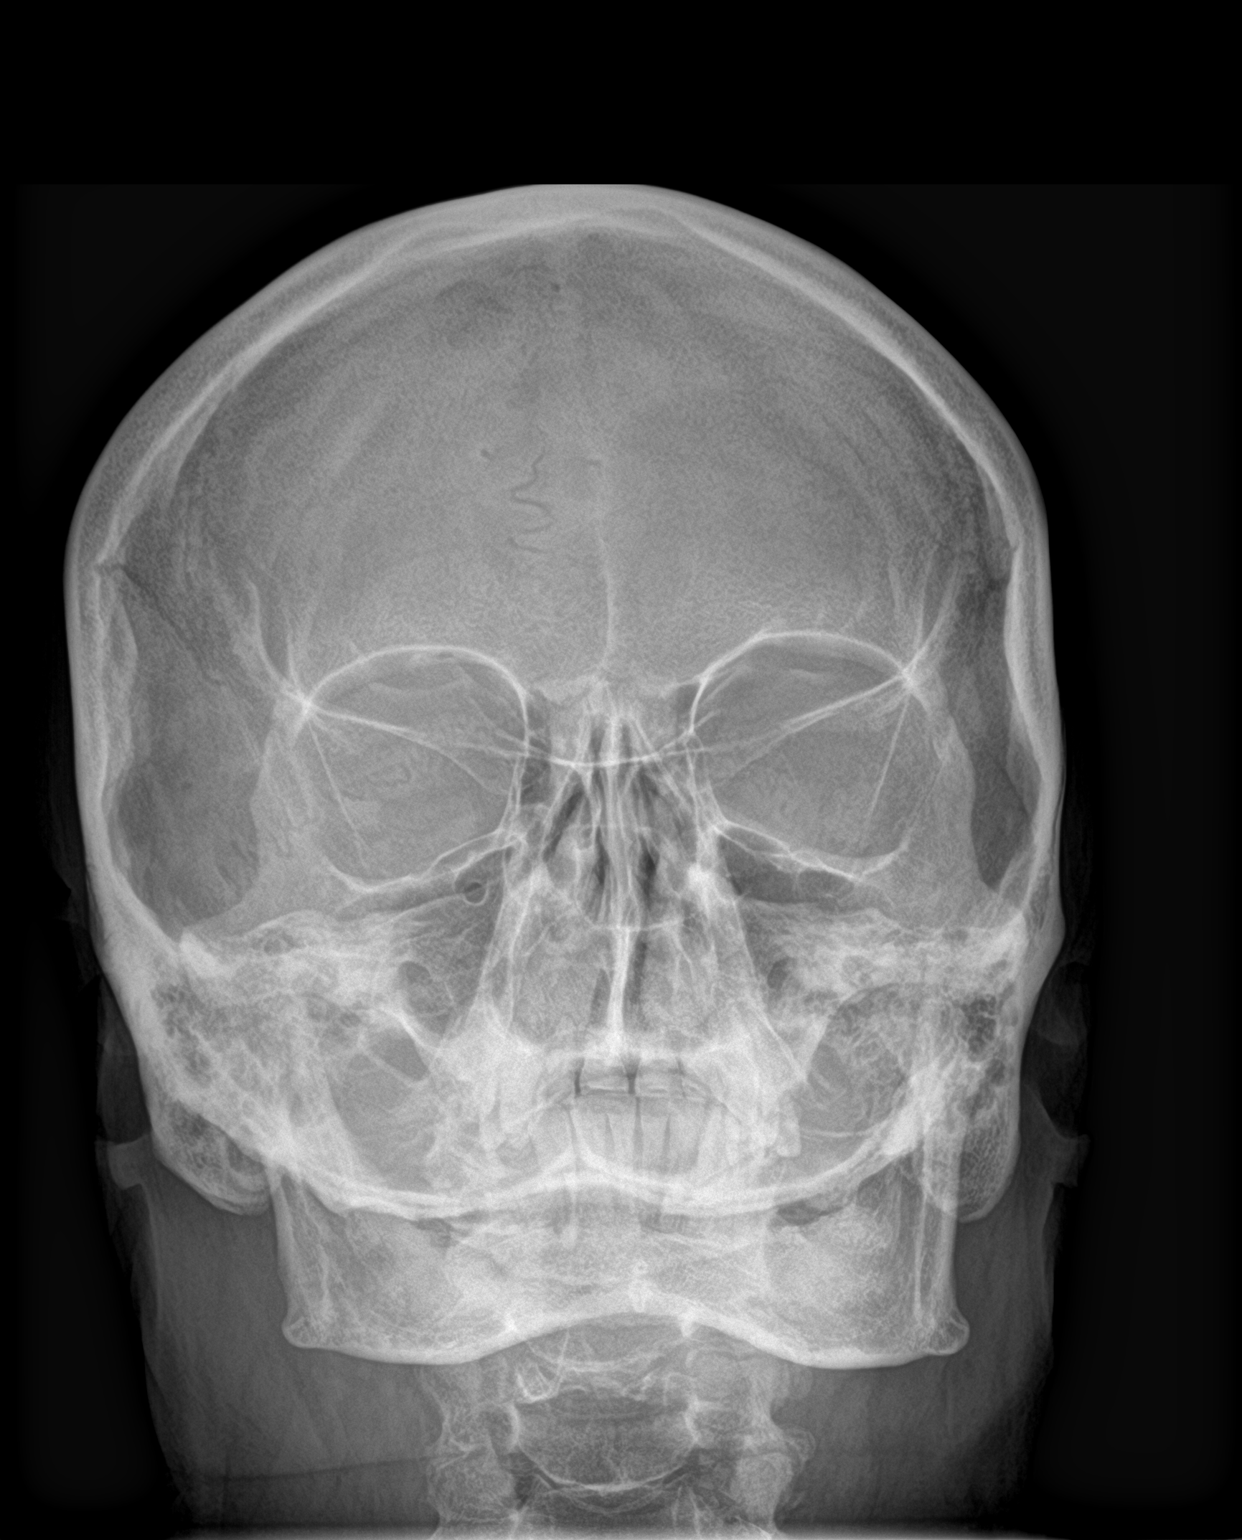

[orbits waters (2 of 2)]
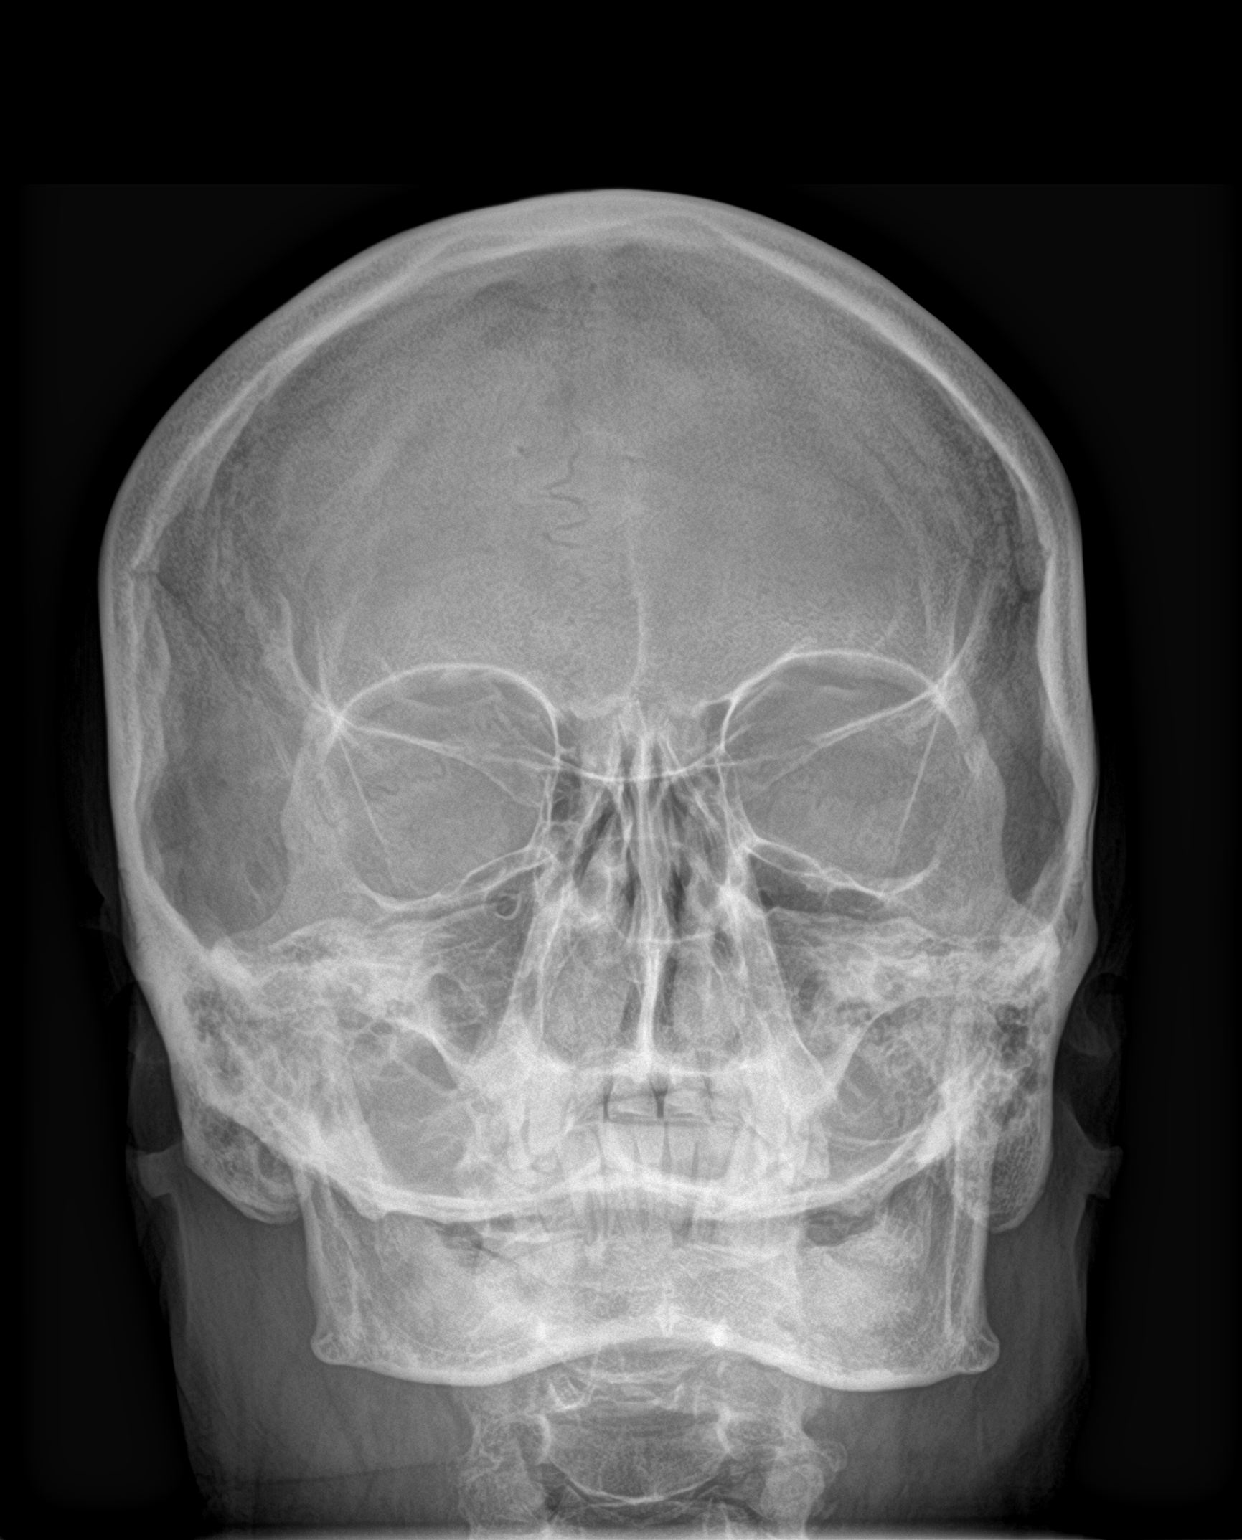

[2 of 2 positions shown; findings below may reference images not displayed]

FINDINGS: There is no evidence of metallic foreign body within the orbits. No
significant bone abnormality identified.
IMPRESSION: No evidence of metallic foreign body within the orbits.

## 2021-11-09 ENCOUNTER — Encounter: Payer: Self-pay | Admitting: Hematology & Oncology

## 2021-11-09 ENCOUNTER — Inpatient Hospital Stay (HOSPITAL_BASED_OUTPATIENT_CLINIC_OR_DEPARTMENT_OTHER): Payer: 59 | Admitting: Hematology & Oncology

## 2021-11-09 ENCOUNTER — Inpatient Hospital Stay: Payer: 59 | Attending: Internal Medicine

## 2021-11-09 ENCOUNTER — Other Ambulatory Visit: Payer: Self-pay

## 2021-11-09 VITALS — BP 113/79 | HR 63 | Temp 98.2°F | Resp 16 | Ht 71.0 in | Wt 219.0 lb

## 2021-11-09 DIAGNOSIS — Z7901 Long term (current) use of anticoagulants: Secondary | ICD-10-CM | POA: Insufficient documentation

## 2021-11-09 DIAGNOSIS — C3411 Malignant neoplasm of upper lobe, right bronchus or lung: Secondary | ICD-10-CM | POA: Diagnosis present

## 2021-11-09 DIAGNOSIS — C3400 Malignant neoplasm of unspecified main bronchus: Secondary | ICD-10-CM

## 2021-11-09 DIAGNOSIS — C7970 Secondary malignant neoplasm of unspecified adrenal gland: Secondary | ICD-10-CM | POA: Diagnosis not present

## 2021-11-09 DIAGNOSIS — I4891 Unspecified atrial fibrillation: Secondary | ICD-10-CM | POA: Insufficient documentation

## 2021-11-09 DIAGNOSIS — C779 Secondary and unspecified malignant neoplasm of lymph node, unspecified: Secondary | ICD-10-CM | POA: Diagnosis not present

## 2021-11-09 DIAGNOSIS — C3401 Malignant neoplasm of right main bronchus: Secondary | ICD-10-CM

## 2021-11-09 LAB — CMP (CANCER CENTER ONLY)
ALT: 9 U/L (ref 0–44)
AST: 14 U/L — ABNORMAL LOW (ref 15–41)
Albumin: 4.2 g/dL (ref 3.5–5.0)
Alkaline Phosphatase: 76 U/L (ref 38–126)
Anion gap: 7 (ref 5–15)
BUN: 6 mg/dL (ref 6–20)
CO2: 29 mmol/L (ref 22–32)
Calcium: 9.5 mg/dL (ref 8.9–10.3)
Chloride: 108 mmol/L (ref 98–111)
Creatinine: 1.05 mg/dL (ref 0.61–1.24)
GFR, Estimated: >60 mL/min (ref >60)
Glucose, Bld: 106 mg/dL — ABNORMAL HIGH (ref 70–99)
Potassium: 4.3 mmol/L (ref 3.5–5.1)
Sodium: 144 mmol/L (ref 135–145)
Total Bilirubin: 0.5 mg/dL (ref 0.3–1.2)
Total Protein: 7.1 g/dL (ref 6.5–8.1)

## 2021-11-09 LAB — CBC WITH DIFFERENTIAL (CANCER CENTER ONLY)
Abs Immature Granulocytes: 0.02 10*3/uL (ref 0.00–0.07)
Basophils Absolute: 0 10*3/uL (ref 0.0–0.1)
Basophils Relative: 1 %
Eosinophils Absolute: 0.4 10*3/uL (ref 0.0–0.5)
Eosinophils Relative: 7 %
HCT: 43.8 % (ref 39.0–52.0)
Hemoglobin: 13.9 g/dL (ref 13.0–17.0)
Immature Granulocytes: 0 %
Lymphocytes Relative: 19 %
Lymphs Abs: 1.1 10*3/uL (ref 0.7–4.0)
MCH: 29.5 pg (ref 26.0–34.0)
MCHC: 31.7 g/dL (ref 30.0–36.0)
MCV: 93 fL (ref 80.0–100.0)
Monocytes Absolute: 0.7 10*3/uL (ref 0.1–1.0)
Monocytes Relative: 12 %
Neutro Abs: 3.4 10*3/uL (ref 1.7–7.7)
Neutrophils Relative %: 61 %
Platelet Count: 201 10*3/uL (ref 150–400)
RBC: 4.71 MIL/uL (ref 4.22–5.81)
RDW: 14.6 % (ref 11.5–15.5)
WBC Count: 5.7 10*3/uL (ref 4.0–10.5)
nRBC: 0 % (ref 0.0–0.2)

## 2021-11-09 LAB — LACTATE DEHYDROGENASE: LDH: 188 U/L (ref 98–192)

## 2021-11-09 LAB — PREALBUMIN: Prealbumin: 27 mg/dL (ref 18–38)

## 2021-11-09 NOTE — Progress Notes (Signed)
+ Hematology and Oncology Follow Up Visit  John [name] [id number] [birth year] 61 y.o. 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 here today for follow-up.  He finally is looking a lot better.  He had a second kyphoplasty.  This really did help.  He had kyphoplasty I think at L3 and maybe L5.  This is helped him quite a bit.  He is much more functional.  His quality of life is doing better.  Because he is doing better, he would like to continue to hold off on treatment.  I have no problems with his.  Have a condition and that we are not going to cure.  We can certainly treat and shrink.  However, this would entail toxicity.  I am we will repeat a CT scan on him probably in about 2 weeks.  We will see if there is any significant growth of his malignancy.  He has had a good appetite.  He has had no change in bowel or bladder habits.  His breathing is doing a whole lot better.  He is not on supplemental oxygen.  He continues on the anticoagulation for the atrial fibrillation.  He has had no leg swelling.  He has had no rashes.  Overall, I was his performance status is probably ECOG 1.    Medications:  Allergies as of 11/09/2021       Reactions   Albuterol Other (See Comments)   Patient has had episode of atrial fib following administration of albuterol (tolerates Xopenex well)   Atorvastatin Other (See Comments)   Causes arthritis pain        Medication List        Accurate as of November 09, 2021  8:36 AM. If you have any questions, ask your nurse or doctor.          amiodarone  200 MG tablet Commonly known as: Pacerone Take 1 tablet (200 mg total) by mouth daily.   benzonatate 200 MG capsule Commonly known as: TESSALON Take 1 capsule (200 mg total) by mouth 3 (three) times daily as needed for cough.   diclofenac Sodium 1 % Gel Commonly known as: VOLTAREN Apply 1 Application topically 2 (two) times daily as needed (pain).   Eliquis 5 MG Tabs tablet Generic drug: apixaban TAKE 1 TABLET(5 MG) BY MOUTH TWICE DAILY   ergocalciferol 1.25 MG (50000 UT) capsule Commonly known as: VITAMIN D2 Take 1 capsule (50,000 Units total) by mouth once a week. What changed: when to take this   HYDROcodone bit-homatropine 5-1.5 MG/5ML syrup Commonly known as: HYCODAN Take 5 mLs by mouth every 6 (six) hours as needed for cough.   metoprolol succinate 50 MG 24 hr tablet Commonly known as: TOPROL-XL Take 1 tablet (50 mg total) by mouth daily. Take with or immediately following a meal.   omeprazole 20 MG tablet Commonly known as: PRILOSEC OTC Take 20 mg by mouth at bedtime.   Oxycodone HCl 10 MG Tabs Take 1 tablet (10 mg total) by mouth every 6 (six) hours as needed. What  changed: how much to take   PARoxetine 10 MG tablet Commonly known as: PAXIL Take 10 mg by mouth at bedtime.   polyethylene glycol 17 g packet Commonly known as: MIRALAX / GLYCOLAX Take 17 g by mouth daily as needed for severe constipation.   predniSONE 5 MG tablet Commonly known as: DELTASONE Take 5 mg by mouth in the morning.   Repatha SureClick 625 MG/ML Soaj Generic drug: Evolocumab Inject 1 Pen into the skin every 14 (fourteen) days.        Allergies:  Allergies  Allergen Reactions   Albuterol Other (See Comments)    Patient has had episode of atrial fib following administration of albuterol (tolerates Xopenex well)   Atorvastatin Other (See Comments)    Causes arthritis pain    Past Medical History, Surgical history, Social history, and Family History were reviewed and  updated.  Review of Systems: Review of Systems  Constitutional:  Positive for malaise/fatigue.  HENT: Negative.    Eyes: Negative.   Respiratory:  Positive for cough and shortness of breath.   Cardiovascular:  Positive for chest pain.  Gastrointestinal: Negative.   Genitourinary: Negative.   Musculoskeletal:  Positive for joint pain.  Skin: Negative.   Neurological:  Positive for focal weakness.  Endo/Heme/Allergies: Negative.   Psychiatric/Behavioral: Negative.       Physical Exam:  vitals were not taken for this visit.   Wt Readings from Last 3 Encounters:  10/12/21 215 lb (97.5 kg)  09/29/21 215 lb 12.8 oz (97.9 kg)  09/22/21 219 lb (99.3 kg)  Vital signs are temperature of 98.4.  Pulse 94.  Blood pressure 116/75.  Weight is 233 pounds.  Physical Exam Vitals reviewed.  HENT:     Head: Normocephalic and atraumatic.  Eyes:     Pupils: Pupils are equal, round, and reactive to light.  Cardiovascular:     Rate and Rhythm: Normal rate and regular rhythm.     Heart sounds: Normal heart sounds.  Pulmonary:     Effort: Pulmonary effort is normal.     Breath sounds: Normal breath sounds.  Abdominal:     General: Bowel sounds are normal.     Palpations: Abdomen is soft.  Musculoskeletal:        General: No tenderness or deformity. Normal range of motion.     Cervical back: Normal range of motion.  Lymphadenopathy:     Cervical: No cervical adenopathy.  Skin:    General: Skin is warm and dry.     Findings: No erythema or rash.  Neurological:     Mental Status: He is alert and oriented to person, place, and time.  Psychiatric:        Behavior: Behavior normal.        Thought Content: Thought content normal.        Judgment: Judgment normal.     Lab Results  Component Value Date   WBC 5.7 11/09/2021   HGB 13.9 11/09/2021   HCT 43.8 11/09/2021   MCV 93.0 11/09/2021   PLT 201 11/09/2021   Lab Results  Component Value Date   FERRITIN 446 (H) 08/23/2021   IRON  189 (H) 08/26/2021   TIBC 213 (L) 08/26/2021   UIBC 24 08/26/2021   IRONPCTSAT 89 (H) 08/26/2021   Lab Results  Component Value Date   RETICCTPCT 1.9 08/23/2021   RBC 4.71 11/09/2021   No results found for: "KPAFRELGTCHN", "LAMBDASER", "KAPLAMBRATIO" No results found for: "IGGSERUM", "IGA", "IGMSERUM" No results found for: "TOTALPROTELP", "  ALBUMINELP", "A1GS", "A2GS", "BETS", "BETA2SER", "GAMS", "MSPIKE", "SPEI"   Chemistry      Component Value Date/Time   NA 138 10/12/2021 0626   K 3.6 10/12/2021 0626   CL 105 10/12/2021 0626   CO2 23 10/12/2021 0626   BUN 7 10/12/2021 0626   CREATININE 1.06 10/12/2021 0626   CREATININE 0.94 10/06/2021 0909      Component Value Date/Time   CALCIUM 8.8 (L) 10/12/2021 0626   ALKPHOS 81 10/06/2021 0909   AST 14 (L) 10/06/2021 0909   ALT 7 10/06/2021 0909   BILITOT 0.6 10/06/2021 0909       Impression and Plan: Mr. Steig is a very pleasant 61 yo gentleman with metastatic squamous cell carcinoma of the right lung. Tumor had high PD-L1 score.  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  I knew that the kyphoplasty would help him.  It really has not.  He is much happier.  His quality of life is doing so much better.  He wants to focus on his quality of life.  I totally agree with this.  We will go ahead and do the CT scan a couple weeks.  I will plan to see him back in 3 weeks.  At that time, we will then make a decision about whether or not we want to do treatment.   Volanda Napoleon, MD 9/25/20238:36 AM

## 2021-11-12 ENCOUNTER — Ambulatory Visit: Payer: 59 | Admitting: Radiation Oncology

## 2021-11-16 ENCOUNTER — Ambulatory Visit (HOSPITAL_BASED_OUTPATIENT_CLINIC_OR_DEPARTMENT_OTHER)
Admission: RE | Admit: 2021-11-16 | Discharge: 2021-11-16 | Disposition: A | Discharge status: Home / Self Care | Payer: Managed Care, Other (non HMO) | Source: Ambulatory Visit | Attending: Hematology & Oncology | Admitting: Hematology & Oncology

## 2021-11-16 DIAGNOSIS — C3400 Malignant neoplasm of unspecified main bronchus: Secondary | ICD-10-CM | POA: Diagnosis present

## 2021-11-16 MED ORDER — IOHEXOL 300 MG/ML  SOLN
100.0000 mL | Freq: Once | INTRAMUSCULAR | Status: AC | PRN
  Administered 2021-11-16: 100 mL via INTRAVENOUS

## 2021-11-23 ENCOUNTER — Encounter: Payer: Self-pay | Admitting: Internal Medicine

## 2021-11-28 IMAGING — DX DG CHEST 1V PORT
2 series · 2 of 2 positions shown · non-contrast
Comparison: 04/29/2020

CLINICAL DATA: Weakness and tachycardia

EXAM:
PORTABLE CHEST 1 VIEW

[chest ap (1 of 2)]
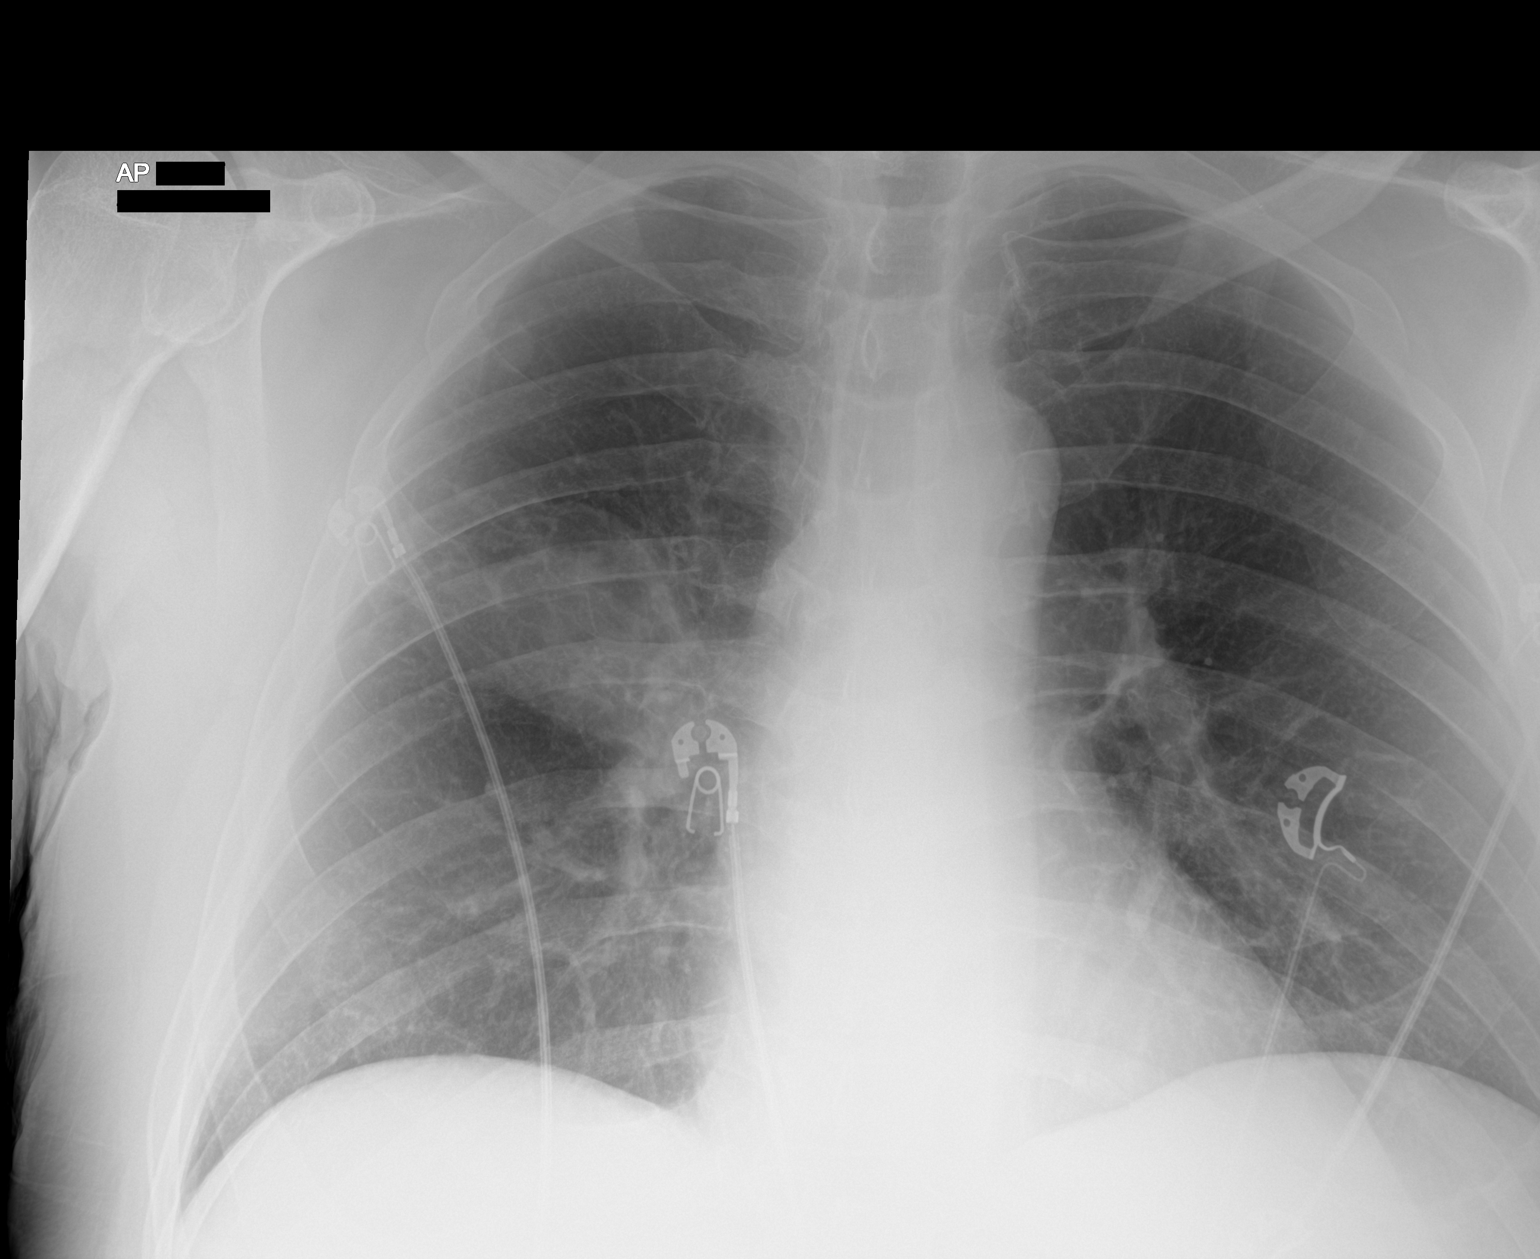

[chest ap (2 of 2)]
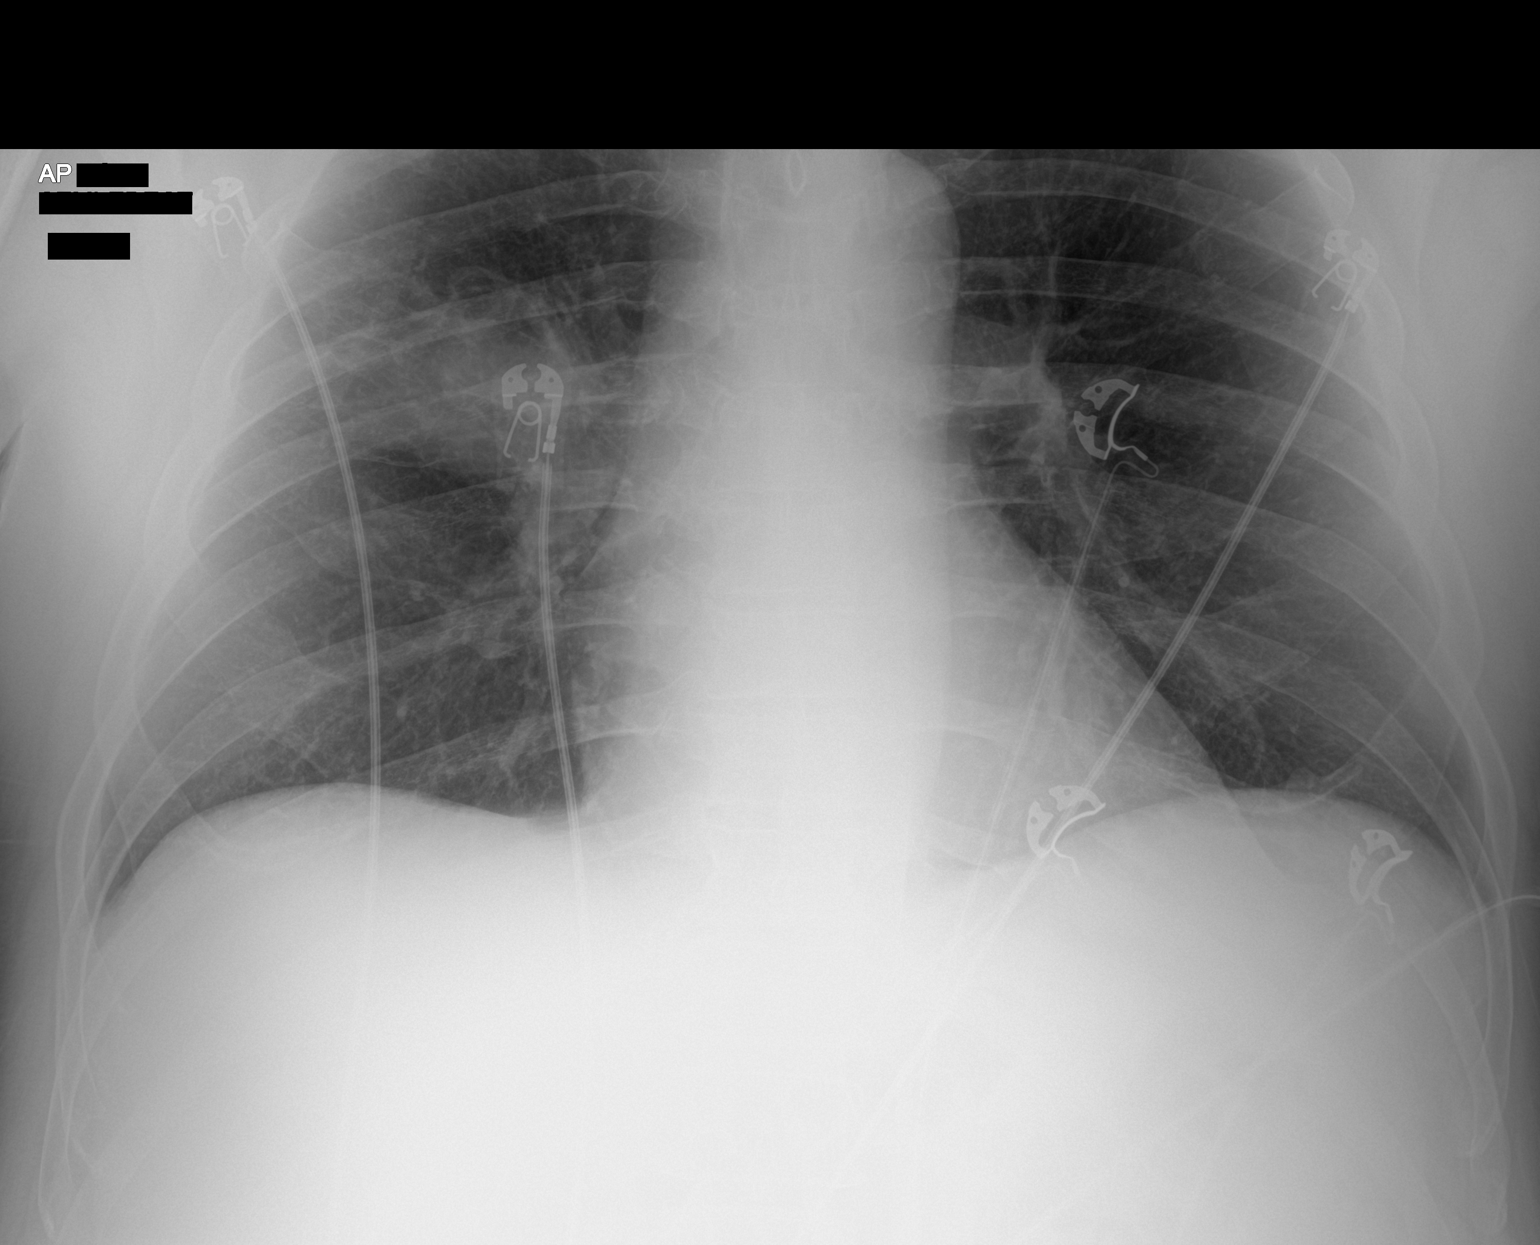

[2 of 2 positions shown; findings below may reference images not displayed]

FINDINGS: Cardiac shadows within normal limits. Lungs are well aerated
bilaterally. Persistent central mass is noted along the minor
fissure stable from the prior exam. Additional and nodule is noted
in the right upper lobe stable from the prior CT examination. No
other focal abnormality is seen.
IMPRESSION: Stable right-sided lung masses.

## 2021-11-30 ENCOUNTER — Encounter: Payer: Self-pay | Admitting: Hematology & Oncology

## 2021-11-30 ENCOUNTER — Inpatient Hospital Stay: Payer: Managed Care, Other (non HMO) | Attending: Internal Medicine

## 2021-11-30 ENCOUNTER — Inpatient Hospital Stay (HOSPITAL_BASED_OUTPATIENT_CLINIC_OR_DEPARTMENT_OTHER): Payer: Managed Care, Other (non HMO) | Admitting: Hematology & Oncology

## 2021-11-30 ENCOUNTER — Other Ambulatory Visit: Payer: Self-pay

## 2021-11-30 ENCOUNTER — Encounter: Payer: Self-pay | Admitting: Internal Medicine

## 2021-11-30 VITALS — BP 116/77 | HR 57 | Temp 97.7°F | Resp 19 | Ht 71.0 in | Wt 229.0 lb

## 2021-11-30 DIAGNOSIS — C3411 Malignant neoplasm of upper lobe, right bronchus or lung: Secondary | ICD-10-CM | POA: Insufficient documentation

## 2021-11-30 DIAGNOSIS — C7970 Secondary malignant neoplasm of unspecified adrenal gland: Secondary | ICD-10-CM | POA: Insufficient documentation

## 2021-11-30 DIAGNOSIS — I4891 Unspecified atrial fibrillation: Secondary | ICD-10-CM | POA: Insufficient documentation

## 2021-11-30 DIAGNOSIS — C3491 Malignant neoplasm of unspecified part of right bronchus or lung: Secondary | ICD-10-CM

## 2021-11-30 DIAGNOSIS — C779 Secondary and unspecified malignant neoplasm of lymph node, unspecified: Secondary | ICD-10-CM | POA: Diagnosis not present

## 2021-11-30 DIAGNOSIS — C3400 Malignant neoplasm of unspecified main bronchus: Secondary | ICD-10-CM

## 2021-11-30 LAB — LACTATE DEHYDROGENASE: LDH: 194 U/L — ABNORMAL HIGH (ref 98–192)

## 2021-11-30 LAB — CBC WITH DIFFERENTIAL (CANCER CENTER ONLY)
Abs Immature Granulocytes: 0.02 10*3/uL (ref 0.00–0.07)
Basophils Absolute: 0.1 10*3/uL (ref 0.0–0.1)
Basophils Relative: 1 %
Eosinophils Absolute: 0.2 10*3/uL (ref 0.0–0.5)
Eosinophils Relative: 4 %
HCT: 40.3 % (ref 39.0–52.0)
Hemoglobin: 12.9 g/dL — ABNORMAL LOW (ref 13.0–17.0)
Immature Granulocytes: 0 %
Lymphocytes Relative: 13 %
Lymphs Abs: 0.8 10*3/uL (ref 0.7–4.0)
MCH: 29.3 pg (ref 26.0–34.0)
MCHC: 32 g/dL (ref 30.0–36.0)
MCV: 91.6 fL (ref 80.0–100.0)
Monocytes Absolute: 0.6 10*3/uL (ref 0.1–1.0)
Monocytes Relative: 10 %
Neutro Abs: 4.2 10*3/uL (ref 1.7–7.7)
Neutrophils Relative %: 72 %
Platelet Count: 209 10*3/uL (ref 150–400)
RBC: 4.4 MIL/uL (ref 4.22–5.81)
RDW: 14 % (ref 11.5–15.5)
WBC Count: 5.9 10*3/uL (ref 4.0–10.5)
nRBC: 0 % (ref 0.0–0.2)

## 2021-11-30 LAB — CMP (CANCER CENTER ONLY)
ALT: 10 U/L (ref 0–44)
AST: 15 U/L (ref 15–41)
Albumin: 3.8 g/dL (ref 3.5–5.0)
Alkaline Phosphatase: 68 U/L (ref 38–126)
Anion gap: 6 (ref 5–15)
BUN: 7 mg/dL (ref 6–20)
CO2: 30 mmol/L (ref 22–32)
Calcium: 9.1 mg/dL (ref 8.9–10.3)
Chloride: 106 mmol/L (ref 98–111)
Creatinine: 1.03 mg/dL (ref 0.61–1.24)
GFR, Estimated: >60 mL/min (ref >60)
Glucose, Bld: 110 mg/dL — ABNORMAL HIGH (ref 70–99)
Potassium: 4.2 mmol/L (ref 3.5–5.1)
Sodium: 142 mmol/L (ref 135–145)
Total Bilirubin: 0.5 mg/dL (ref 0.3–1.2)
Total Protein: 6.4 g/dL — ABNORMAL LOW (ref 6.5–8.1)

## 2021-11-30 LAB — PREALBUMIN: Prealbumin: 18 mg/dL (ref 18–38)

## 2021-11-30 NOTE — Progress Notes (Signed)
+ Hematology and Oncology Follow Up Visit  John [name] [id number] [birth year] 61 y.o. 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  Clearly, this is best to have him look in about 4 5 months.  He comes walking in.  He does not have any oxygen.  He is were not hurting.  The kyphoplasty in his back really does seem to help.  We did do a CAT scan on him.  This was done on 11/16/2021.  Thankfully, the CAT scan did not show any evidence of cancer progression.  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  We are he had radiation for the right adrenal mass.  This is decreased from 6.2 x 4.4 down to 4.1 x 2.4 cm.  There is no evidence of any obvious progressive metastatic disease.  He has numerous compression fractures in his back which is, no surprise, because of steroids.  I am just happy that his quality of life is doing so well right now.  He is working.  He is eating well.  He is gained some weight.  His wife is watching this closely.  He has had no issues with nausea or vomiting.  He has had no increased cough or shortness of breath.  He does have the atrial fibrillation.  He is on amiodarone for this.  There is been no problem bowels or bladder.  After his last CT scan, he did have a lot of diarrhea.  He has had a little bit of leg swelling but this may not be as prominent.  He has had no headache.  He has had no swollen lymph nodes.  Overall, I would say his performance status is probably ECOG 1.    Medications:  Allergies as of 11/30/2021        Reactions   Albuterol Other (See Comments)   Patient has had episode of atrial fib following administration of albuterol (tolerates Xopenex well)   Atorvastatin Other (See Comments)   Causes arthritis pain        Medication List        Accurate as of November 30, 2021  2:17 PM. If you have any questions, ask your nurse or doctor.          amiodarone 200 MG tablet Commonly known as: Pacerone Take 1 tablet (200 mg total) by mouth daily.   Eliquis 5 MG Tabs tablet Generic drug: apixaban Take 5 mg by mouth 2 (two) times daily.   ergocalciferol 1.25 MG (50000 UT) capsule Commonly known as: VITAMIN D2 Take 1 capsule (50,000 Units total) by mouth once a week. What changed: when to take this   metoprolol succinate 50 MG 24 hr tablet Commonly known as: TOPROL-XL Take 1 tablet (50 mg total) by mouth daily. Take with or immediately following a meal.   omeprazole 20 MG tablet Commonly known as: PRILOSEC OTC Take 20 mg by mouth at bedtime.   PARoxetine 10 MG tablet Commonly known as: PAXIL Take 10 mg by mouth at bedtime.  predniSONE 5 MG tablet Commonly known as: DELTASONE Take 5 mg by mouth in the morning.   Repatha SureClick 528 MG/ML [place] Generic drug: Evolocumab Inject 1 Pen into the skin every 14 (fourteen) days.        Allergies:  Allergies  Allergen Reactions   Albuterol Other (See Comments)    Patient has had episode of atrial fib following administration of albuterol (tolerates Xopenex well)   Atorvastatin Other (See Comments)    Causes arthritis pain    Past Medical History, Surgical history, Social history, and Family History were reviewed and updated.  Review of Systems: Review of Systems  Constitutional:  Positive for malaise/fatigue.  HENT: Negative.    Eyes: Negative.   Respiratory:  Positive for cough and shortness of breath.   Cardiovascular:  Positive for chest pain.  Gastrointestinal: Negative.   Genitourinary: Negative.    Musculoskeletal:  Positive for joint pain.  Skin: Negative.   Neurological:  Positive for focal weakness.  Endo/Heme/Allergies: Negative.   Psychiatric/Behavioral: Negative.       Physical Exam:  height is _0  (1.803 m) and weight is 229 lb (103.9 kg). His oral temperature is 97.7 F (36.5 C). His blood pressure is 116/77 and his pulse is 57 (abnormal). His respiration is 19 and oxygen saturation is 95%.   Wt Readings from Last 3 Encounters:  11/30/21 229 lb (103.9 kg)  11/09/21 219 lb (99.3 kg)  10/12/21 215 lb (97.5 kg)  Vital signs are temperature of 98.4.  Pulse 94.  Blood pressure 116/75.  Weight is 233 pounds.  Physical Exam Vitals reviewed.  HENT:     Head: Normocephalic and atraumatic.  Eyes:     Pupils: Pupils are equal, round, and reactive to light.  Cardiovascular:     Rate and Rhythm: Normal rate and regular rhythm.     Heart sounds: Normal heart sounds.  Pulmonary:     Effort: Pulmonary effort is normal.     Breath sounds: Normal breath sounds.  Abdominal:     General: Bowel sounds are normal.     Palpations: Abdomen is soft.  Musculoskeletal:        General: No tenderness or deformity. Normal range of motion.     Cervical back: Normal range of motion.  Lymphadenopathy:     Cervical: No cervical adenopathy.  Skin:    General: Skin is warm and dry.     Findings: No erythema or rash.  Neurological:     Mental Status: He is alert and oriented to person, place, and time.  Psychiatric:        Behavior: Behavior normal.        Thought Content: Thought content normal.        Judgment: Judgment normal.    Lab Results  Component Value Date   WBC 5.9 11/30/2021   HGB 12.9 (L) 11/30/2021   HCT 40.3 11/30/2021   MCV 91.6 11/30/2021   PLT 209 11/30/2021   Lab Results  Component Value Date   FERRITIN 446 (H) 08/23/2021   IRON 189 (H) 08/26/2021   TIBC 213 (L) 08/26/2021   UIBC 24 08/26/2021   IRONPCTSAT 89 (H) 08/26/2021   Lab Results  Component  Value Date   RETICCTPCT 1.9 08/23/2021   RBC 4.40 11/30/2021   No results found for: "KPAFRELGTCHN", "LAMBDASER", "KAPLAMBRATIO" No results found for: "IGGSERUM", "IGA", "IGMSERUM" No results found for: "TOTALPROTELP", "ALBUMINELP", "A1GS", "A2GS", "BETS", "BETA2SER", "GAMS", "MSPIKE", "SPEI"   Chemistry  Component Value Date/Time   NA 142 11/30/2021 1346   K 4.2 11/30/2021 1346   CL 106 11/30/2021 1346   CO2 30 11/30/2021 1346   BUN 7 11/30/2021 1346   CREATININE 1.03 11/30/2021 1346      Component Value Date/Time   CALCIUM 9.1 11/30/2021 1346   ALKPHOS 68 11/30/2021 1346   AST 15 11/30/2021 1346   ALT 10 11/30/2021 1346   BILITOT 0.5 11/30/2021 1346       Impression and Plan: John [name] is a very pleasant 61 yo gentleman with metastatic squamous cell carcinoma of the right lung.  The tumor had high PD-L1 score.  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  Again, everything that we do is for quality of life.  For right now, he is doing well.  He is asymptomatic.  I think we can just watch him.  I probably would repeat a CT scan in a couple months.  Provide 100 able to enjoy the Thanksgiving holiday, at least.  I know that they will have a big family gathering at their house.  For right now, I will plan to get him back in about a month or so.  I do not think we need any scans probably until December.   Volanda Napoleon, MD 10/16/20232:17 PM

## 2021-12-07 ENCOUNTER — Ambulatory Visit (INDEPENDENT_AMBULATORY_CARE_PROVIDER_SITE_OTHER): Payer: Managed Care, Other (non HMO) | Admitting: Student

## 2021-12-07 ENCOUNTER — Encounter: Payer: Self-pay | Admitting: Student

## 2021-12-07 DIAGNOSIS — M069 Rheumatoid arthritis, unspecified: Secondary | ICD-10-CM | POA: Diagnosis not present

## 2021-12-07 DIAGNOSIS — I4819 Other persistent atrial fibrillation: Secondary | ICD-10-CM | POA: Diagnosis not present

## 2021-12-07 DIAGNOSIS — J9611 Chronic respiratory failure with hypoxia: Secondary | ICD-10-CM

## 2021-12-07 DIAGNOSIS — G40A01 Absence epileptic syndrome, not intractable, with status epilepticus: Secondary | ICD-10-CM

## 2021-12-07 DIAGNOSIS — C3491 Malignant neoplasm of unspecified part of right bronchus or lung: Secondary | ICD-10-CM

## 2021-12-07 DIAGNOSIS — K219 Gastro-esophageal reflux disease without esophagitis: Secondary | ICD-10-CM

## 2021-12-07 DIAGNOSIS — F419 Anxiety disorder, unspecified: Secondary | ICD-10-CM

## 2021-12-07 DIAGNOSIS — J984 Other disorders of lung: Secondary | ICD-10-CM

## 2021-12-07 NOTE — Patient Instructions (Signed)
Mr. Barnette,  It was a pleasure seeing you in the clinic today and we are happy that you decided to choose [place] to provide your primary care services!   I am glad to see that you are doing better!  Please make sure to follow up with your oncology team on 12/29/2021. Please make sure to follow up with you cardiology team on 12/30/2021. I have refilled your paroxetine. Please come back to see [place] in 3 months or sooner if you have any acute concerns.  Please call our clinic at [phone] if you have any questions or concerns. The best time to call is Monday-Friday from 9am-4pm, but there is someone available 24/7 at the same number. If you need medication refills, please notify your pharmacy one week in advance and they will send [place] a request.   Thank you for letting us take part in your care. We look forward to seeing you next time!

## 2021-12-07 NOTE — Assessment & Plan Note (Signed)
Patient with history of atrial fibrillation, thought to be induced by aggressive albuterol therapy for a prior COPD exacerbation. He was chemically cardioverted with amiodarone and has continued on maintenance therapy with amiodarone 200mg  daily, toprol-xl 50mg  daily, and eliquis 5mg  BID. 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 He has discussed risk of pulmonary side effects of amiodarone with cardiology, who recommended continuing amiodarone as prior attempts to discontinue it precipitated atrial fibrillation episodes. He is currently doing well on this regimen and is not actively in atrial fibrillation (NSR on exam).  Plan: -continue amiodarone, toprol-xl, and eliquis -f/u with cardiology, next appt 12/30/2021 with Dr. Radford Pax

## 2021-12-07 NOTE — Assessment & Plan Note (Signed)
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 He was on dilantin therapy for about 15 years, but this was stopped at age 61. He has not had seizure recurrence since that time.

## 2021-12-07 NOTE — Assessment & Plan Note (Signed)
Patient reports being diagnosed with RA a long time ago. He was trialed on multiple different medications, including injectable medications, per patient with no significant improvement. However, he reports that his arthritic symptoms resolved when he started his chemotherapy. He is now on chronic prednisone therapy (5mg  daily) and he reports that this regimen has been working very well for him. He does not wish to see a rheumatologist at this time, but would like to keep this option open for him if he were to develop symptoms again in the future.  Plan: -continue daily prednisone 5mg 

## 2021-12-07 NOTE — Assessment & Plan Note (Addendum)
Reports history of chronic anxiety that began after experiencing seizure activity. He was started on paroxetine 10mg  daily with good response. He has been on this medication for a long time and he would like to remain on it.  Plan: -continue paroxetine 10mg  daily

## 2021-12-07 NOTE — Progress Notes (Signed)
CC: new patient to establish PCP  HPI:  JohnJohn [name] is a 61 y.o. male with history listed below presenting to the [facility] to establish care with new PCP. Please see individualized problem based charting for full HPI.  Past Medical History:  Diagnosis Date   Abnormal chest x-ray 12/18/2020   Anxiety    Arthritis    Rheumatoid   Atrial fibrillation with RVR ([place]) 05/12/2020   Bacteremia due to Pseudomonas    C. difficile colitis    Chronic anxiety 11/08/2014   COVID 2020   mild case   Depression    Drug-induced neutropenia ([place]) 08/11/2020   Dyspnea    Encounter for antineoplastic chemotherapy 04/10/2020   Encounter for antineoplastic immunotherapy 06/09/2020   Family history of adverse reaction to anesthesia    brother with seizures had episode under anesthesia.  Patient has seizures as well.   GERD (gastroesophageal reflux disease)    History of radiation therapy 04/24/2020-05/16/2020   IMRT to right lung     Dr [name]   History of radiation therapy    08/10/21-08/19/21-Dr. 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 ultrasert 11.0 Lensx   Rheumatoid aortitis    Secondary hypercoagulable state ([place]) 10/28/2020   Sepsis ([place]) 10/06/2020   Shortness of breath 12/18/2020   Squamous cell lung cancer [facility])    Past Surgical History:  Procedure Laterality Date   BRONCHIAL BRUSHINGS  03/27/2020   Procedure: BRONCHIAL BRUSHINGS;  Surgeon: [name], MD;  Location: [place];  Service: Cardiopulmonary;;   BUBBLE STUDY  10/13/2020   Procedure: BUBBLE STUDY;  Surgeon: Pixie Casino, MD;  Location: [place];  Service: Cardiovascular;;   CARDIOVERSION N/A 10/13/2020   Procedure: CARDIOVERSION;  Surgeon: Pixie Casino, MD;  Location: [facility] ENDOSCOPY;  Service: Cardiovascular;  Laterality: N/A;   CATARACT EXTRACTION  2016   at [place]  03/27/2020   Procedure: FINE NEEDLE ASPIRATION;  Surgeon: [name], MD;  Location: [place];  Service: Cardiopulmonary;;   HIP SURGERY Left    IR KYPHO EA ADDL LEVEL THORACIC OR LUMBAR  10/12/2021   IR KYPHO LUMBAR INC FX REDUCE BONE BX UNI/BIL CANNULATION INC/IMAGING  10/12/2021   IR KYPHO THORACIC WITH BONE BIOPSY  09/07/2021   IR RADIOLOGIST EVAL & MGMT  09/03/2021   RADIOLOGY WITH ANESTHESIA N/A 10/12/2021   Procedure: L1 and L3 Kyphoplasty;  Surgeon: Pedro Earls, MD;  Location: [place];  Service: Radiology;  Laterality: N/A;   TEE WITHOUT CARDIOVERSION N/A 10/13/2020   Procedure: TRANSESOPHAGEAL ECHOCARDIOGRAM (TEE);  Surgeon: Pixie Casino, MD;  Location: [place];  Service: Cardiovascular;  Laterality: N/A;   VIDEO BRONCHOSCOPY WITH ENDOBRONCHIAL ULTRASOUND N/A 03/27/2020   Procedure: VIDEO BRONCHOSCOPY WITH ENDOBRONCHIAL ULTRASOUND;  Surgeon: [name], MD;  Location: [place];  Service: Cardiopulmonary;  Laterality: N/A;   Current Outpatient Medications on File Prior to Visit  Medication Sig Dispense Refill   amiodarone (PACERONE) 200 MG tablet Take 1 tablet (200 mg total) by mouth daily. 30 tablet 3   ELIQUIS 5 MG TABS  tablet Take 5 mg by mouth 2 (two) times daily.     Evolocumab (REPATHA SURECLICK) 401 MG/ML SOAJ Inject 1 Pen into the skin every 14 (fourteen) days. 2 mL 11   metoprolol succinate (TOPROL-XL) 50 MG 24 hr tablet Take 1 tablet (50 mg total) by mouth daily. Take with or immediately following a meal. 30 tablet 0   omeprazole (PRILOSEC OTC) 20 MG tablet Take 20 mg by mouth at bedtime.     PARoxetine (PAXIL) 10 MG tablet Take 10 mg by mouth at bedtime.     predniSONE (DELTASONE)  5 MG tablet Take 5 mg by mouth in the morning.     [DISCONTINUED] prochlorperazine (COMPAZINE) 10 MG tablet Take 1 tablet (10 mg total) by mouth every 6 (six) hours as needed (Nausea or vomiting). (Patient not taking: Reported on 06/29/2021) 30 tablet 1   No current facility-administered medications on file prior to visit.   Allergies  Allergen Reactions   Albuterol Other (See Comments)    Patient has had episode of atrial fib following administration of albuterol (tolerates Xopenex well)   Atorvastatin Other (See Comments)    Causes arthritis pain   Family History  Problem Relation Age of Onset   Arrhythmia Mother        has PPM   Heart disease Father        Died at 34, started in his 48s, heart attacks, had PPM and ICD    Social History   Socioeconomic History   Marital status: Married    Spouse name: Not on file   Number of children: Not on file   Years of education: Not on file   Highest education level: Not on file  Occupational History   Not on file  Tobacco Use   Smoking status: Former    Packs/day: 1.00    Years: 30.00    Total pack years: 30.00    Types: Cigarettes    Quit date: 2018    Years since quitting: 5.8   Smokeless tobacco: Never   Tobacco comments:    Former smoker 10/28/2020  Vaping Use   Vaping Use: Never used  Substance and Sexual Activity   Alcohol use: Not Currently   Drug use: Never   Sexual activity: Not on file  Other Topics Concern   Not on file  Social History Narrative   ** Merged History Encounter **       Social Determinants of Health   Financial Resource Strain: Not on file  Food Insecurity: Not on file  Transportation Needs: Not on file  Physical Activity: Not on file  Stress: Not on file  Social Connections: Not on file  Intimate Partner Violence: Not on file     Review of Systems:  Negative aside from that listed in individualized problem based charting.  Physical Exam:  Vitals:   12/07/21 1102  BP: 132/84   Pulse: 81  SpO2: 92%  Weight: 226 lb 14.4 oz (102.9 kg)  Height: 5\' 11"  (1.803 m)   Physical Exam Constitutional:      General: He is not in acute distress.    Appearance: Normal appearance. He is obese. He is not ill-appearing.  HENT:     Head: Normocephalic and atraumatic.     Nose: Nose normal. No congestion.     Mouth/Throat:     Mouth: Mucous membranes are moist.     Pharynx: Oropharynx is clear. No oropharyngeal exudate.  Eyes:     General: No scleral icterus.  Extraocular Movements: Extraocular movements intact.     Conjunctiva/sclera: Conjunctivae normal.     Pupils: Pupils are equal, round, and reactive to light.  Cardiovascular:     Rate and Rhythm: Normal rate and regular rhythm.     Pulses: Normal pulses.     Heart sounds: Normal heart sounds. No murmur heard.    No friction rub. No gallop.  Pulmonary:     Effort: Pulmonary effort is normal.     Comments: Coarse lung sounds on R side, chronic per patient. Otherwise, no wheezing, rhonchi, or crackles noted. Saturating 92% on RA.  Abdominal:     General: Bowel sounds are normal. There is no distension.     Palpations: Abdomen is soft. There is no mass.     Tenderness: There is no abdominal tenderness.     Hernia: No hernia is present.  Musculoskeletal:        General: No swelling. Normal range of motion.     Cervical back: Normal range of motion.  Skin:    General: Skin is warm and dry.     Comments: Multiple smalls bruises in different stages of healing on bilateral upper extremities.  Neurological:     General: No focal deficit present.     Mental Status: He is alert and oriented to person, place, and time.     Motor: No weakness.     Coordination: Coordination normal.     Gait: Gait normal.  Psychiatric:        Mood and Affect: Mood normal.        Behavior: Behavior normal.        Thought Content: Thought content normal.      Assessment & Plan:   See Encounters Tab for problem based  charting.  Patient discussed with Dr. Daryll Drown

## 2021-12-07 NOTE — Assessment & Plan Note (Signed)
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 He follows [name] for treatment. He has tried [place] in the past but had to stop due to pulmonary toxicity. He tried taxotere as well, but this was stopped due to tumor progression. His next treatment was planned with Gemzar and Navelbine, but this is currently on hold per patient. He had a recent CT earlier this month that showed decreased size of right perihilar lesion and adrenal mass, indicating treatment response, but showed early signs of likely radiation fibrosis. He is currently doing much better and his quality of life seems improved, which is great. He will be following up with [name] on 12/29/2021.  Plan: -f/u with oncology on 12/29/2021

## 2021-12-07 NOTE — Assessment & Plan Note (Signed)
Controlled with OTC omeprazole as needed.

## 2021-12-07 NOTE — Assessment & Plan Note (Signed)
Thought to be 2/2 restrictive lung disease from scarring 2/2 Keytruda therapy. He does also have a history of COPD. Per patient, he is currently on albuterol inhaler therapy alone which he uses infrequently as needed for [facility]. He is on oxygen therapy at home as needed but has not required it at rest. He feels that he needs it more with exertion alone. He is planning on scheduling a visit with his pulmonologist, Dr. [name], to assess his ambulatory oxygen requirements to see if he can obtain a portable oxygen device to use with ambulation. He is saturating 92% at rest today, which is reasonable given COPD history. He does have coarse lung sounds on the R, but this appears to be a chronic finding per patient. No respiratory distress or accessory muscle use noted.   Plan: -albuterol prn -home oxygen 2L [place] prn with ambulation -schedule visit with pulmonology

## 2021-12-11 NOTE — Progress Notes (Signed)
Internal Medicine Clinic Attending  Case discussed with Dr. Allyson Sabal  at the time of the visit.  We reviewed the resident's history and exam and pertinent patient test results.  I agree with the assessment, diagnosis, and plan of care documented in the resident's note.

## 2021-12-14 ENCOUNTER — Other Ambulatory Visit: Payer: Self-pay

## 2021-12-16 NOTE — Telephone Encounter (Signed)
Please send rx in

## 2021-12-17 MED ORDER — PAROXETINE HCL 10 MG PO TABS
10.0000 mg | ORAL_TABLET | Freq: Every day | ORAL | 1 refills | Status: DC
Start: 2021-12-17 — End: 2022-02-17

## 2021-12-29 ENCOUNTER — Encounter: Payer: Self-pay | Admitting: Hematology & Oncology

## 2021-12-29 ENCOUNTER — Inpatient Hospital Stay (HOSPITAL_BASED_OUTPATIENT_CLINIC_OR_DEPARTMENT_OTHER): Payer: Managed Care, Other (non HMO) | Admitting: Hematology & Oncology

## 2021-12-29 ENCOUNTER — Other Ambulatory Visit: Payer: Self-pay

## 2021-12-29 ENCOUNTER — Inpatient Hospital Stay: Payer: Managed Care, Other (non HMO) | Attending: Internal Medicine

## 2021-12-29 VITALS — BP 117/76 | HR 59 | Temp 97.9°F | Resp 16 | Ht 71.0 in | Wt 232.0 lb

## 2021-12-29 DIAGNOSIS — C779 Secondary and unspecified malignant neoplasm of lymph node, unspecified: Secondary | ICD-10-CM | POA: Insufficient documentation

## 2021-12-29 DIAGNOSIS — C7970 Secondary malignant neoplasm of unspecified adrenal gland: Secondary | ICD-10-CM | POA: Diagnosis not present

## 2021-12-29 DIAGNOSIS — I4891 Unspecified atrial fibrillation: Secondary | ICD-10-CM | POA: Diagnosis not present

## 2021-12-29 DIAGNOSIS — C3491 Malignant neoplasm of unspecified part of right bronchus or lung: Secondary | ICD-10-CM

## 2021-12-29 DIAGNOSIS — C3411 Malignant neoplasm of upper lobe, right bronchus or lung: Secondary | ICD-10-CM | POA: Diagnosis present

## 2021-12-29 LAB — CBC WITH DIFFERENTIAL (CANCER CENTER ONLY)
Abs Immature Granulocytes: 0.03 10*3/uL (ref 0.00–0.07)
Basophils Absolute: 0.1 10*3/uL (ref 0.0–0.1)
Basophils Relative: 1 %
Eosinophils Absolute: 0.3 10*3/uL (ref 0.0–0.5)
Eosinophils Relative: 4 %
HCT: 43.1 % (ref 39.0–52.0)
Hemoglobin: 14.1 g/dL (ref 13.0–17.0)
Immature Granulocytes: 0 %
Lymphocytes Relative: 11 %
Lymphs Abs: 0.8 10*3/uL (ref 0.7–4.0)
MCH: 29.6 pg (ref 26.0–34.0)
MCHC: 32.7 g/dL (ref 30.0–36.0)
MCV: 90.4 fL (ref 80.0–100.0)
Monocytes Absolute: 0.7 10*3/uL (ref 0.1–1.0)
Monocytes Relative: 9 %
Neutro Abs: 5.7 10*3/uL (ref 1.7–7.7)
Neutrophils Relative %: 75 %
Platelet Count: 192 10*3/uL (ref 150–400)
RBC: 4.77 MIL/uL (ref 4.22–5.81)
RDW: 14.4 % (ref 11.5–15.5)
WBC Count: 7.6 10*3/uL (ref 4.0–10.5)
nRBC: 0 % (ref 0.0–0.2)

## 2021-12-29 LAB — LACTATE DEHYDROGENASE: LDH: 180 U/L (ref 98–192)

## 2021-12-29 LAB — CMP (CANCER CENTER ONLY)
ALT: 13 U/L (ref 0–44)
AST: 16 U/L (ref 15–41)
Albumin: 4.1 g/dL (ref 3.5–5.0)
Alkaline Phosphatase: 81 U/L (ref 38–126)
Anion gap: 8 (ref 5–15)
BUN: 10 mg/dL (ref 6–20)
CO2: 28 mmol/L (ref 22–32)
Calcium: 9.3 mg/dL (ref 8.9–10.3)
Chloride: 104 mmol/L (ref 98–111)
Creatinine: 1.07 mg/dL (ref 0.61–1.24)
GFR, Estimated: >60 mL/min (ref >60)
Glucose, Bld: 100 mg/dL — ABNORMAL HIGH (ref 70–99)
Potassium: 4.1 mmol/L (ref 3.5–5.1)
Sodium: 140 mmol/L (ref 135–145)
Total Bilirubin: 0.6 mg/dL (ref 0.3–1.2)
Total Protein: 7.1 g/dL (ref 6.5–8.1)

## 2021-12-29 NOTE — Progress Notes (Signed)
+ Hematology and Oncology Follow Up Visit  John [name] [id number] 09-23-1960 61 y.o. 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  He continues to look quite good.  He is more active.  He is working.  He is going to church.  I am just so happy that his quality life is doing a lot better now.  All of this is because he had his back fixed.  He had kyphoplasty for the compression fractures in his back.  The fractures were not from malignancy.  They are from steroids.  He just is more active.  He feels better.  He feels more functional.  He has had no increased cough or shortness of breath.  He sees the cardiologist tomorrow about the atrial fibrillation.  Maybe, he will be taken off amiodarone.  He has had no fever.  He has had no bleeding.  He has had no change in bowel or bladder habits.  He has had no leg swelling.  His weight is doing well.  He is eating well.  Overall, I would say that his performance status is probably ECOG 1.  .      Medications:  Allergies as of 12/29/2021       Reactions   Albuterol Other (See Comments)   Patient has had episode of atrial fib following administration of albuterol (tolerates Xopenex well)   Atorvastatin Other (See Comments)   Causes arthritis pain        Medication List        Accurate as of December 29, 2021 12:16 PM. If you have any questions, ask your nurse or doctor.          amiodarone 200 MG tablet Commonly known as: Pacerone Take 1 tablet (200 mg total) by mouth daily.   Eliquis 5 MG Tabs  tablet Generic drug: apixaban Take 5 mg by mouth 2 (two) times daily.   metoprolol succinate 50 MG 24 hr tablet Commonly known as: TOPROL-XL Take 1 tablet (50 mg total) by mouth daily. Take with or immediately following a meal.   omeprazole 20 MG tablet Commonly known as: PRILOSEC OTC Take 20 mg by mouth at bedtime.   PARoxetine 10 MG tablet Commonly known as: PAXIL Take 1 tablet (10 mg total) by mouth at bedtime.   predniSONE 5 MG tablet Commonly known as: DELTASONE Take 5 mg by mouth in the morning.   Repatha SureClick 1664MG/ML Soaj Generic drug: Evolocumab Inject 1 Pen into the skin every 14 (fourteen) days.        Allergies:  Allergies  Allergen Reactions   Albuterol Other (See Comments)    Patient has had episode of atrial fib following administration of albuterol (tolerates Xopenex well)   Atorvastatin Other (See Comments)    Causes arthritis pain    Past Medical History, Surgical history, Social history, and Family History were reviewed and updated.  Review of Systems: Review of Systems  Constitutional:  Positive for malaise/fatigue.  HENT: Negative.    Eyes: Negative.   Respiratory:  Positive for cough and shortness of breath.   Cardiovascular:  Positive for chest pain.  Gastrointestinal: Negative.   Genitourinary: Negative.   Musculoskeletal:  Positive for joint pain.  Skin: Negative.   Neurological:  Positive for focal weakness.  Endo/Heme/Allergies: Negative.   Psychiatric/Behavioral: Negative.       Physical Exam:  vitals were not taken for this visit.   Wt Readings from Last 3 Encounters:  12/07/21 226 lb 14.4 oz (102.9 kg)  11/30/21 229 lb (103.9 kg)  11/09/21 219 lb (99.3 kg)  Vital signs are temperature of 98.4.  Pulse 94.  Blood pressure 116/75.  Weight is 233 pounds.  Physical Exam Vitals reviewed.  HENT:     Head: Normocephalic and atraumatic.  Eyes:     Pupils: Pupils are equal, round, and reactive to light.  Cardiovascular:      Rate and Rhythm: Normal rate and regular rhythm.     Heart sounds: Normal heart sounds.  Pulmonary:     Effort: Pulmonary effort is normal.     Breath sounds: Normal breath sounds.  Abdominal:     General: Bowel sounds are normal.     Palpations: Abdomen is soft.  Musculoskeletal:        General: No tenderness or deformity. Normal range of motion.     Cervical back: Normal range of motion.  Lymphadenopathy:     Cervical: No cervical adenopathy.  Skin:    General: Skin is warm and dry.     Findings: No erythema or rash.  Neurological:     Mental Status: He is alert and oriented to person, place, and time.  Psychiatric:        Behavior: Behavior normal.        Thought Content: Thought content normal.        Judgment: Judgment normal.     Lab Results  Component Value Date   WBC 7.6 12/29/2021   HGB 14.1 12/29/2021   HCT 43.1 12/29/2021   MCV 90.4 12/29/2021   PLT 192 12/29/2021   Lab Results  Component Value Date   FERRITIN 446 (H) 08/23/2021   IRON 189 (H) 08/26/2021   TIBC 213 (L) 08/26/2021   UIBC 24 08/26/2021   IRONPCTSAT 89 (H) 08/26/2021   Lab Results  Component Value Date   RETICCTPCT 1.9 08/23/2021   RBC 4.77 12/29/2021   No results found for: "KPAFRELGTCHN", "LAMBDASER", "KAPLAMBRATIO" No results found for: "IGGSERUM", "IGA", "IGMSERUM" No results found for: "TOTALPROTELP", "ALBUMINELP", "A1GS", "A2GS", "BETS", "BETA2SER", "GAMS", "MSPIKE", "SPEI"   Chemistry      Component Value Date/Time   NA 140 12/29/2021 1145   K 4.1 12/29/2021 1145   CL 104 12/29/2021 1145   CO2 28 12/29/2021 1145   BUN 10 12/29/2021 1145   CREATININE 1.07 12/29/2021 1145      Component Value Date/Time   CALCIUM 9.3 12/29/2021 1145   ALKPHOS 81 12/29/2021 1145   AST 16 12/29/2021 1145   ALT 13 12/29/2021 1145   BILITOT 0.6 12/29/2021 1145       Impression and Plan: John [name] is a very pleasant 61 yo gentleman with metastatic squamous cell carcinoma of the right  lung.  The tumor had high PD-L1 score.  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  Again, everything that we do is for quality of life.  For right now, he is doing well.  He is asymptomatic.  I think we can just watch him.  I will go ahead and get another CT scan on him in December.  We will try to get this near the end of the month.  Again, I am just incredibly happy that he has the quality of life that he would like to have.   Volanda Napoleon, MD 11/14/202312:17 PM

## 2021-12-30 ENCOUNTER — Ambulatory Visit: Payer: Managed Care, Other (non HMO) | Attending: Cardiology | Admitting: Cardiology

## 2021-12-30 ENCOUNTER — Encounter: Payer: Self-pay | Admitting: Cardiology

## 2021-12-30 VITALS — BP 110/78 | HR 65 | Ht 71.0 in | Wt 233.0 lb

## 2021-12-30 DIAGNOSIS — I4819 Other persistent atrial fibrillation: Secondary | ICD-10-CM

## 2021-12-30 DIAGNOSIS — E785 Hyperlipidemia, unspecified: Secondary | ICD-10-CM

## 2021-12-30 DIAGNOSIS — I2510 Atherosclerotic heart disease of native coronary artery without angina pectoris: Secondary | ICD-10-CM

## 2021-12-30 MED ORDER — REPATHA SURECLICK 140 MG/ML ~~LOC~~ SOAJ: 1.0000 mL | SUBCUTANEOUS | 3 refills | Status: DC

## 2021-12-30 MED ORDER — AMIODARONE HCL 200 MG PO TABS: 200.0000 mg | ORAL_TABLET | Freq: Every day | ORAL | 3 refills | Status: DC

## 2021-12-30 NOTE — Progress Notes (Signed)
Cardiology Office Note:    Date:  12/30/2021   ID:  John [name], DOB 03-20-1960, MRN [id number]  PCP:  John Axe, MD  Cardiologist:  Fransico Him, MD    Referring MD: [name], MD   Chief Complaint  Patient presents with   Atrial Fibrillation   Hyperlipidemia   Coronary Artery Disease    History of Present Illness:    John [name] is a 61 y.o. male with a hx of squamous cell carcinoma of lung, rheumatoid arthritis, GERD.  In April he presented to the emergency department with complaints of generalized weakness.  He had been started on systemic chemotherapy recently PTA.  He was noted to be tachycardic in rapid atrial fibrillation.  He was hospitalized for further management of acute C diff diarrhea, sepsis and febrile neutropenia.  Blood cx were + for E coli and started on antibx. He was started on IV Cardizem gtt for his afib.  TSH was normal.  He was started on  started on IV Amio and ultimately converted to NSR.  He was transitioned to oral Amio and Lopressor was added. No anticoagulation was started given his CHADS2VASC score of 0 as well as concern for possible tumor pressing on PA on CT imaging of chest.    He has had history of paroxysmal atrial fibrillation originally diagnosed in March 2022.  At that time he was started on amiodarone and converted to normal sinus rhythm.  There was concerns about potential amiodarone lung toxicity on CT scan performed 07/18/2020.  Amiodarone was stopped.  He had recurrent atrial fibrillation during hospitalization in August 2022.  At that time he underwent successful cardioversion.  After discussion with pulmonary, amiodarone was resumed.   He is followed in A-fib clinic and was last seen by them in January 2023 at which time he was maintaining sinus rhythm on amiodarone 200 mg daily Toprol-XL 25 mg daily. But then later in January 2023 after possible pulmonary fibrosis once again seen on CT scan, [name] had discussion requested  that amiodarone once again be discontinued.  After this point, he had another admission on 2/23 for spontaneous pneumothorax with chest tube.   He was hospitalized 08/01/2021 with recurrent atrial fibrillation with RVR in the 170s.  He had been off his amiodarone for over 3 months and expressed wishes to get back on amiodarone.  He understood the risks involved with going back on amiodarone in the setting of his pulmonary fibrosis but said that given his stage IV lung cancer he does not want to deal with atrial fibrillation so he was started on IV Amio  load and converted to p.o. prior to discharge.    He was seen back in A-fib clinic on 08/13/2021 and was maintaining normal sinus rhythm on amiodarone 200 mg twice daily.  It was decreased to 200 mg daily.  He is here today for followup.  He also has chronic DOE due to his lung CA that he thinks is at baseline .  He denies any chest pain or pressure, PND, orthopnea, LE edema, dizziness, palpitations or syncope. He is compliant with his meds and is tolerating meds with no SE    Past Medical History:  Diagnosis Date   Arthritis    Rheumatoid   Atrial fibrillation with RVR ([place]) 05/12/2020   Bacteremia due to Pseudomonas    C. difficile colitis    Chronic anxiety 11/08/2014   COVID 2020   mild case   Depression    Drug-induced neutropenia (  [place]) 08/11/2020   Family history of adverse reaction to anesthesia    brother with seizures had episode under anesthesia.  Patient has seizures as well.   GERD (gastroesophageal reflux disease)    History of radiation therapy 04/24/2020-05/16/2020   IMRT to right lung     Dr [name]   History of radiation therapy    08/10/21-08/19/21-Dr. [name]   Neutropenia [facility]) 05/12/2020   Neutropenic fever ([place]) 05/12/2020   Normocytic anemia 04/18/2021   PAF (paroxysmal atrial fibrillation) ([facility])    CHADS2VSAC score 0   Rheumatoid aortitis    Secondary hypercoagulable state ([place]) 10/28/2020   Sepsis ([place])  10/06/2020   Shortness of breath 12/18/2020   Squamous cell lung cancer [facility])     Past Surgical History:  Procedure Laterality Date   BRONCHIAL BRUSHINGS  03/27/2020   Procedure: BRONCHIAL BRUSHINGS;  Surgeon: [name], MD;  Location: [place];  Service: Cardiopulmonary;;   BUBBLE STUDY  10/13/2020   Procedure: BUBBLE STUDY;  Surgeon: Pixie Casino, MD;  Location: [place];  Service: Cardiovascular;;   CARDIOVERSION N/A 10/13/2020   Procedure: CARDIOVERSION;  Surgeon: Pixie Casino, MD;  Location[facility] ENDOSCOPY;  Service: Cardiovascular;  Laterality: N/A;   CATARACT EXTRACTION  2016   at [place]  03/27/2020   Procedure: FINE NEEDLE ASPIRATION;  Surgeon: [name], MD;  Location: [place];  Service: Cardiopulmonary;;   HIP SURGERY Left    IR KYPHO EA ADDL LEVEL THORACIC OR LUMBAR  10/12/2021   IR KYPHO LUMBAR INC FX REDUCE BONE BX UNI/BIL CANNULATION INC/IMAGING  10/12/2021   IR KYPHO THORACIC WITH BONE BIOPSY  09/07/2021   IR RADIOLOGIST EVAL & MGMT  09/03/2021   RADIOLOGY WITH ANESTHESIA N/A 10/12/2021   Procedure: L1 and L3 Kyphoplasty;  Surgeon: Pedro Earls, MD;  Location: [place];  Service: Radiology;  Laterality: N/A;   TEE WITHOUT CARDIOVERSION N/A 10/13/2020   Procedure: TRANSESOPHAGEAL ECHOCARDIOGRAM (TEE);  Surgeon: Pixie Casino, MD;  Location: [place];  Service: Cardiovascular;  Laterality: N/A;   VIDEO BRONCHOSCOPY WITH ENDOBRONCHIAL ULTRASOUND N/A 03/27/2020   Procedure: VIDEO BRONCHOSCOPY WITH ENDOBRONCHIAL ULTRASOUND;  Surgeon: [name], MD;  Location: [place];  Service: Cardiopulmonary;  Laterality: N/A;    Current Medications: Current Meds  Medication Sig   amiodarone (PACERONE) 200 MG tablet Take 1 tablet (200 mg total) by mouth daily.   ELIQUIS 5 MG TABS tablet Take 5 mg by mouth 2 (two) times daily.   metoprolol succinate (TOPROL-XL) 50 MG 24 hr tablet Take 1 tablet (50 mg total) by  mouth daily. Take with or immediately following a meal.   omeprazole (PRILOSEC OTC) 20 MG tablet Take 20 mg by mouth at bedtime.   PARoxetine (PAXIL) 10 MG tablet Take 1 tablet (10 mg total) by mouth at bedtime.   predniSONE (DELTASONE) 5 MG tablet Take 5 mg by mouth in the morning.     Allergies:   Albuterol and Atorvastatin   Social History   Socioeconomic History   Marital status: Married    Spouse name: Not on file   Number of children: Not on file   Years of education: Not on file   Highest education level: Not on file  Occupational History   Not on file  Tobacco Use   Smoking status: Former    Packs/day: 1.00    Years: 30.00    Total pack years: 30.00    Types: Cigarettes  Quit date: 2018    Years since quitting: 5.8   Smokeless tobacco: Never   Tobacco comments:    Former smoker, quit 2018  Vaping Use   Vaping Use: Never used  Substance and Sexual Activity   Alcohol use: Not Currently   Drug use: Never   Sexual activity: Not on file  Other Topics Concern   Not on file  Social History Narrative   Not on file   Social Determinants of Health   Financial Resource Strain: Not on file  Food Insecurity: Not on file  Transportation Needs: Not on file  Physical Activity: Not on file  Stress: Not on file  Social Connections: Not on file     Family History: The patient's family history includes Arrhythmia in his mother; Heart disease in his father.  ROS:   Please see the history of present illness.    ROS  All other systems reviewed and negative.   EKGs/Labs/Other Studies Reviewed:    The following studies were reviewed today: none  EKG:  EKG is not ordered today.    Recent Labs: 08/20/2021: B Natriuretic Peptide 34.6 08/26/2021: Magnesium 2.0 10/06/2021: TSH 3.047 12/29/2021: ALT 13; BUN 10; Creatinine 1.07; Hemoglobin 14.1; Platelet Count 192; Potassium 4.1; Sodium 140   Recent Lipid Panel    Component Value Date/Time   CHOL 237 (H) 06/29/2021  0754   TRIG 121 06/29/2021 0754   HDL 31 (L) 06/29/2021 0754   CHOLHDL 7.6 06/29/2021 0754   VLDL 24 06/29/2021 0754   LDLCALC 182 (H) 06/29/2021 0754   LDLDIRECT 102 (H) 10/06/2021 1807    CHA2DS2-VASc Score =   [ ] .  Therefore, the patient's annual risk of stroke is   %.        Physical Exam:    VS:  BP 110/78   Pulse 65   Ht 5\' 11"  (1.803 m)   Wt 233 lb (105.7 kg)   SpO2 92%   BMI 32.50 kg/m     Wt Readings from Last 3 Encounters:  12/30/21 233 lb (105.7 kg)  12/29/21 232 lb (105.2 kg)  12/07/21 226 lb 14.4 oz (102.9 kg)     GEN: Well nourished, well developed in no acute distress HEENT: Normal NECK: No JVD; No carotid bruits LYMPHATICS: No lymphadenopathy CARDIAC:RRR, no murmurs, rubs, gallops RESPIRATORY:  Clear to auscultation without rales, wheezing or rhonchi  ABDOMEN: Soft, non-tender, non-distended MUSCULOSKELETAL:  No edema; No deformity  SKIN: Warm and dry NEUROLOGIC:  Alert and oriented x 3 PSYCHIATRIC:  Normal affect  ASSESSMENT:    1. Persistent atrial fibrillation ([place])   2. Coronary artery calcification seen on CT scan   3. Hyperlipidemia LDL goal <70    PLAN:    In order of problems listed above:  PAF --Followed in A-fib clinic -CHA2DS2-VASc score is 3 indicating a 3.2% of annual risk of stroke -He is maintaining normal sinus rhythm on exam and denies any palpitations -He was placed back on p.o. amiodarone in the hospital and understands the risk going forward of staying on amiodarone in the setting of underlying pulmonary fibrosis from [facility] -Continue prescription drug management with Lopressor 25 mg twice daily, amiodarone 200 mg daily and Xarelto 20 mg daily with as needed refills -I have personally reviewed and interpreted outside labs performed by patient's PCP which showed TSH 3.047 in August 2023 and ALT 163 on 11/14/202 -he sees his ophthalmologist yearly -he has only minimal drop in DLCO a year ago so I will repeat  PFTs with DLCO  in June 2023 which will be 1 year back on Amio  Coronary artery calcifications -Noted on chest CT 11/16/2021 -He denies any anginal symptoms>> given lack of symptoms and stage IV metastatic lung cancer would not pursue further ischemic work-up at this time and will get a coronary Ca score -No aspirin due to DOAC -Continue statin therapy  Hyperlipidemia -LDL less than 70 -he was supposed to start Repatha but never got it started due to other health issues -I will have him start the Repatha -repeat FLP and ALT In 6 weeks  Followup with me in 1 year as well as A-fib clinic   Medication Adjustments/Labs and Tests Ordered: Current medicines are reviewed at length with the patient today.  Concerns regarding medicines are outlined above.  No orders of the defined types were placed in this encounter.  No orders of the defined types were placed in this encounter.   Signed, Fransico Him, MD  12/30/2021 2:31 PM    [place]

## 2021-12-30 NOTE — Addendum Note (Signed)
Addended by: Bernestine Amass on: 12/30/2021 02:50 PM   Modules accepted: Orders

## 2021-12-30 NOTE — Patient Instructions (Signed)
Medication Instructions:  Your physician recommends that you continue on your current medications as directed. Please refer to the Current Medication list given to you today.  *If you need a refill on your cardiac medications before your next appointment, please call your pharmacy*  Lab Work: IN 8 WEEKS: Fasting lipids, CMET and TSH If you have labs (blood work) drawn today and your tests are completely normal, you will receive your results only by: [place] (if you have MyChart) OR A paper copy in the mail If you have any lab test that is abnormal or we need to change your treatment, we will call you to review the results.  Testing/Procedures: Your physician has recommended that you have a pulmonary function test in June 2024. Pulmonary Function Tests are a group of tests that measure how well air moves in and out of your lungs.  Your physician has requested that you have a calcium score CT scan in January 2024. There is a $99 fee for the scan.   Follow-Up: At [facility], you and your health needs are our priority.  As part of our continuing mission to provide you with exceptional heart care, we have created designated Provider Care Teams.  These Care Teams include your primary Cardiologist (physician) and Advanced Practice Providers (APPs -  Physician Assistants and Nurse Practitioners) who all work together to provide you with the care you need, when you need it.  Your next appointment:   6 month(s)  The format for your next appointment:   In Person  Provider:   Fransico Him, MD     Important Information About Sugar

## 2021-12-30 NOTE — Addendum Note (Signed)
Addended by: Bernestine Amass on: 12/30/2021 02:54 PM   Modules accepted: Orders

## 2022-01-06 ENCOUNTER — Encounter: Payer: Self-pay | Admitting: Internal Medicine

## 2022-01-06 ENCOUNTER — Telehealth: Payer: Self-pay | Admitting: Cardiology

## 2022-01-06 MED ORDER — REPATHA SURECLICK 140 MG/ML ~~LOC~~ SOAJ: 1.0000 mL | SUBCUTANEOUS | 3 refills | Status: DC

## 2022-01-06 NOTE — Telephone Encounter (Signed)
  Pt c/o medication issue:  1. Name of Medication:   Evolocumab (REPATHA SURECLICK) 427 MG/ML SOAJ    2. How are you currently taking this medication (dosage and times per day)? Inject 140 mg into the skin every 14 (fourteen) days.   3. Are you having a reaction (difficulty breathing--STAT)? No   4. What is your medication issue? Tanya with [facility] calling she said they fax a request for prior auth for pt's repatha

## 2022-01-06 NOTE — Telephone Encounter (Signed)
Looks like pt has new insurance. Already had prior authorization approval on file through 09/2022 with [facility] insurance. Looks like he has [place] now. New authorization has been submitted - Key: [id number].

## 2022-01-06 NOTE — Telephone Encounter (Signed)
Patient has not yet started Repatha due to hospitalization and surgeries. He is now prepared to start. He already has labs scheduled for Jan.

## 2022-01-06 NOTE — Telephone Encounter (Signed)
LVM for patient to call back to let him know new PA has been approved. Also needs to be set up for lab work.

## 2022-01-06 NOTE — Telephone Encounter (Signed)
PA approved with new insurance through 01/06/23. New rx sent to pharmacy.

## 2022-01-15 ENCOUNTER — Other Ambulatory Visit: Payer: Self-pay

## 2022-02-03 ENCOUNTER — Encounter (HOSPITAL_BASED_OUTPATIENT_CLINIC_OR_DEPARTMENT_OTHER): Payer: Self-pay

## 2022-02-03 ENCOUNTER — Inpatient Hospital Stay: Payer: Managed Care, Other (non HMO) | Admitting: Hematology & Oncology

## 2022-02-03 ENCOUNTER — Ambulatory Visit (HOSPITAL_BASED_OUTPATIENT_CLINIC_OR_DEPARTMENT_OTHER): Payer: Managed Care, Other (non HMO)

## 2022-02-03 ENCOUNTER — Inpatient Hospital Stay: Payer: Managed Care, Other (non HMO)

## 2022-02-11 ENCOUNTER — Other Ambulatory Visit: Payer: Self-pay | Admitting: *Deleted

## 2022-02-11 ENCOUNTER — Ambulatory Visit (HOSPITAL_BASED_OUTPATIENT_CLINIC_OR_DEPARTMENT_OTHER): Admission: RE | Admit: 2022-02-11 | Payer: Managed Care, Other (non HMO) | Source: Ambulatory Visit

## 2022-02-12 ENCOUNTER — Inpatient Hospital Stay: Payer: Managed Care, Other (non HMO) | Admitting: Hematology & Oncology

## 2022-02-12 ENCOUNTER — Encounter: Payer: Self-pay | Admitting: Hematology & Oncology

## 2022-02-12 ENCOUNTER — Ambulatory Visit (HOSPITAL_BASED_OUTPATIENT_CLINIC_OR_DEPARTMENT_OTHER)
Admission: RE | Admit: 2022-02-12 | Discharge: 2022-02-12 | Disposition: A | Discharge status: Home / Self Care | Payer: Managed Care, Other (non HMO) | Source: Ambulatory Visit | Attending: Hematology & Oncology | Admitting: Hematology & Oncology

## 2022-02-12 ENCOUNTER — Inpatient Hospital Stay: Payer: Managed Care, Other (non HMO) | Attending: Internal Medicine

## 2022-02-12 ENCOUNTER — Encounter: Payer: Self-pay | Admitting: Internal Medicine

## 2022-02-12 ENCOUNTER — Other Ambulatory Visit: Payer: Managed Care, Other (non HMO)

## 2022-02-12 VITALS — BP 106/77 | HR 93 | Temp 97.6°F | Resp 24 | Ht 71.0 in | Wt 241.0 lb

## 2022-02-12 DIAGNOSIS — Z923 Personal history of irradiation: Secondary | ICD-10-CM | POA: Insufficient documentation

## 2022-02-12 DIAGNOSIS — C779 Secondary and unspecified malignant neoplasm of lymph node, unspecified: Secondary | ICD-10-CM | POA: Insufficient documentation

## 2022-02-12 DIAGNOSIS — C7970 Secondary malignant neoplasm of unspecified adrenal gland: Secondary | ICD-10-CM | POA: Diagnosis not present

## 2022-02-12 DIAGNOSIS — Z7901 Long term (current) use of anticoagulants: Secondary | ICD-10-CM | POA: Insufficient documentation

## 2022-02-12 DIAGNOSIS — C3411 Malignant neoplasm of upper lobe, right bronchus or lung: Secondary | ICD-10-CM | POA: Insufficient documentation

## 2022-02-12 DIAGNOSIS — C3491 Malignant neoplasm of unspecified part of right bronchus or lung: Secondary | ICD-10-CM

## 2022-02-12 DIAGNOSIS — I4891 Unspecified atrial fibrillation: Secondary | ICD-10-CM | POA: Insufficient documentation

## 2022-02-12 DIAGNOSIS — Z515 Encounter for palliative care: Secondary | ICD-10-CM

## 2022-02-12 LAB — CBC WITH DIFFERENTIAL (CANCER CENTER ONLY)
Abs Immature Granulocytes: 0.02 10*3/uL (ref 0.00–0.07)
Basophils Absolute: 0 10*3/uL (ref 0.0–0.1)
Basophils Relative: 1 %
Eosinophils Absolute: 0.3 10*3/uL (ref 0.0–0.5)
Eosinophils Relative: 5 %
HCT: 43 % (ref 39.0–52.0)
Hemoglobin: 13.9 g/dL (ref 13.0–17.0)
Immature Granulocytes: 0 %
Lymphocytes Relative: 18 %
Lymphs Abs: 1.1 10*3/uL (ref 0.7–4.0)
MCH: 30 pg (ref 26.0–34.0)
MCHC: 32.3 g/dL (ref 30.0–36.0)
MCV: 92.7 fL (ref 80.0–100.0)
Monocytes Absolute: 0.6 10*3/uL (ref 0.1–1.0)
Monocytes Relative: 10 %
Neutro Abs: 4 10*3/uL (ref 1.7–7.7)
Neutrophils Relative %: 66 %
Platelet Count: 194 10*3/uL (ref 150–400)
RBC: 4.64 MIL/uL (ref 4.22–5.81)
RDW: 14.5 % (ref 11.5–15.5)
WBC Count: 6 10*3/uL (ref 4.0–10.5)
nRBC: 0 % (ref 0.0–0.2)

## 2022-02-12 LAB — CMP (CANCER CENTER ONLY)
ALT: 10 U/L (ref 0–44)
AST: 14 U/L — ABNORMAL LOW (ref 15–41)
Albumin: 4.1 g/dL (ref 3.5–5.0)
Alkaline Phosphatase: 80 U/L (ref 38–126)
Anion gap: 8 (ref 5–15)
BUN: 13 mg/dL (ref 6–20)
CO2: 30 mmol/L (ref 22–32)
Calcium: 9 mg/dL (ref 8.9–10.3)
Chloride: 103 mmol/L (ref 98–111)
Creatinine: 1.24 mg/dL (ref 0.61–1.24)
GFR, Estimated: >60 mL/min (ref >60)
Glucose, Bld: 124 mg/dL — ABNORMAL HIGH (ref 70–99)
Potassium: 4.3 mmol/L (ref 3.5–5.1)
Sodium: 141 mmol/L (ref 135–145)
Total Bilirubin: 0.6 mg/dL (ref 0.3–1.2)
Total Protein: 6.8 g/dL (ref 6.5–8.1)

## 2022-02-12 LAB — LACTATE DEHYDROGENASE: LDH: 162 U/L (ref 98–192)

## 2022-02-12 MED ORDER — IOHEXOL 300 MG/ML  SOLN
100.0000 mL | Freq: Once | INTRAMUSCULAR | Status: AC | PRN
  Administered 2022-02-12: 100 mL via INTRAVENOUS

## 2022-02-12 NOTE — Progress Notes (Signed)
+ Hematology and Oncology Follow Up Visit  John [name] [id number] 04/14/1960 61 y.o. 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 MVanhookis here today for follow-up.  He really looks quite good.  He had a wonderful Thanksgiving and Christmas.  He has gained quite a bit of weight.  He is eating well.  He is having no problems with nausea or vomiting.  He is having no problems with cough or shortness of breath.  His heart seems to be doing pretty well.  I think the atrial fibrillation seems to be under very good control.  He is just on daily amiodarone.  He has not had any problems with pain.  His back is doing a lot better after having the vertebroplasty.  He has had no headache.  He has had no abdominal pain.  There is been no nausea or vomiting.  We did do a CT scan on him today.  Unfortunately, the results were not yet back.  His quality life is doing a whole lot better.  He is able to do a lot more what he wants to do.  I think he is even working which he enjoys thoroughly.  His faith has been incredibly strong.  He has been able to go to church.  He is able to encourage some many other church members.  He has had no bleeding.  He is still on Eliquis.  He has had no change in bowel or bladder habits.  He has had no rashes.  He has had no leg swelling.  Currently, I would say that his performance status is probably ECOG 1.    Medications:  Allergies as of 02/12/2022       Reactions   Albuterol Other (See Comments)   Patient has had episode of atrial fib following administration of albuterol  (tolerates Xopenex well)   Atorvastatin Other (See Comments)   Causes arthritis pain        Medication List        Accurate as of February 12, 2022  1:11 PM. If you have any questions, ask your nurse or doctor.          amiodarone 200 MG tablet Commonly known as: Pacerone Take 1 tablet (200 mg total) by mouth daily.   Eliquis 5 MG Tabs tablet Generic drug: apixaban Take 5 mg by mouth 2 (two) times daily.   metoprolol succinate 25 MG 24 hr tablet Commonly known as: TOPROL-XL Take 25 mg by mouth daily. What changed: Another medication with the same name was removed. Continue taking this medication, and follow the directions you see here. Changed by: PVolanda Napoleon MD   omeprazole 20 MG tablet Commonly known as: PRILOSEC OTC Take 20 mg by mouth at bedtime.   PARoxetine 10 MG tablet Commonly known as: PAXIL Take 1 tablet (10 mg total) by mouth at bedtime.   predniSONE 5 MG tablet Commonly known as: DELTASONE Take 5 mg by mouth in the morning.   Repatha SureClick 1854MG/ML Soaj Generic drug: Evolocumab Inject 140 mg into the  skin every 14 (fourteen) days.        Allergies:  Allergies  Allergen Reactions   Albuterol Other (See Comments)    Patient has had episode of atrial fib following administration of albuterol (tolerates Xopenex well)   Atorvastatin Other (See Comments)    Causes arthritis pain    Past Medical History, Surgical history, Social history, and Family History were reviewed and updated.  Review of Systems: Review of Systems  Constitutional:  Positive for malaise/fatigue.  HENT: Negative.    Eyes: Negative.   Respiratory:  Positive for cough and shortness of breath.   Cardiovascular:  Positive for chest pain.  Gastrointestinal: Negative.   Genitourinary: Negative.   Musculoskeletal:  Positive for joint pain.  Skin: Negative.   Neurological:  Positive for focal weakness.  Endo/Heme/Allergies: Negative.   Psychiatric/Behavioral:  Negative.       Physical Exam:  height is _0  (1.803 m) and weight is 241 lb (109.3 kg). His oral temperature is 97.6 F (36.4 C). His blood pressure is 106/77 and his pulse is 93. His respiration is 24 (abnormal) and oxygen saturation is 94%.   Wt Readings from Last 3 Encounters:  02/12/22 241 lb (109.3 kg)  12/30/21 233 lb (105.7 kg)  12/29/21 232 lb (105.2 kg)  Vital signs are temperature of 98.4.  Pulse 94.  Blood pressure 116/75.  Weight is 233 pounds.  Physical Exam Vitals reviewed.  HENT:     Head: Normocephalic and atraumatic.  Eyes:     Pupils: Pupils are equal, round, and reactive to light.  Cardiovascular:     Rate and Rhythm: Normal rate and regular rhythm.     Heart sounds: Normal heart sounds.  Pulmonary:     Effort: Pulmonary effort is normal.     Breath sounds: Normal breath sounds.  Abdominal:     General: Bowel sounds are normal.     Palpations: Abdomen is soft.  Musculoskeletal:        General: No tenderness or deformity. Normal range of motion.     Cervical back: Normal range of motion.  Lymphadenopathy:     Cervical: No cervical adenopathy.  Skin:    General: Skin is warm and dry.     Findings: No erythema or rash.  Neurological:     Mental Status: He is alert and oriented to person, place, and time.  Psychiatric:        Behavior: Behavior normal.        Thought Content: Thought content normal.        Judgment: Judgment normal.    Lab Results  Component Value Date   WBC 6.0 02/12/2022   HGB 13.9 02/12/2022   HCT 43.0 02/12/2022   MCV 92.7 02/12/2022   PLT 194 02/12/2022   Lab Results  Component Value Date   FERRITIN 446 (H) 08/23/2021   IRON 189 (H) 08/26/2021   TIBC 213 (L) 08/26/2021   UIBC 24 08/26/2021   IRONPCTSAT 89 (H) 08/26/2021   Lab Results  Component Value Date   RETICCTPCT 1.9 08/23/2021   RBC 4.64 02/12/2022   No results found for: "KPAFRELGTCHN", "LAMBDASER", "KAPLAMBRATIO" No results found for: "IGGSERUM",  "IGA", "IGMSERUM" No results found for: "TOTALPROTELP", "ALBUMINELP", "A1GS", "A2GS", "BETS", "BETA2SER", "GAMS", "MSPIKE", "SPEI"   Chemistry      Component Value Date/Time   NA 141 02/12/2022 0959   K 4.3 02/12/2022 0959   CL 103 02/12/2022 0959   CO2 30 02/12/2022 0959   BUN 13 02/12/2022  9563   CREATININE 1.24 02/12/2022 0959      Component Value Date/Time   CALCIUM 9.0 02/12/2022 0959   ALKPHOS 80 02/12/2022 0959   AST 14 (L) 02/12/2022 0959   ALT 10 02/12/2022 0959   BILITOT 0.6 02/12/2022 0959       Impression and Plan: Mr. Mcmanamon is a very pleasant 61 yo gentleman with metastatic squamous cell carcinoma of the right lung.  The tumor had high PD-L1 score.  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  For right now, this is all about quality of life.  I would like to believe that the CT scan will show that the cancer is really not growing.  If it is grown, it is growing minimally.  I would like to see him back in 6 weeks.  I think this would be a reasonable.  Again, the weight I think is a very good indicator that his cancer is really not progressing.  Again I hope I am right about this.  Again I just want his quality of life to be as good as possible.  That is what he wishes for.  He wants to be active.  He wants to be able to interact with his family.  I am glad that he was able to have a wonderful Thanksgiving and Christmas.  We will plan to get him back in 6 weeks.  We will try to get him through January.    Volanda Napoleon, MD 12/29/20231:11 PM

## 2022-02-12 NOTE — Progress Notes (Signed)
COMMUNITY PALLIATIVE CARE SW NOTE  PATIENT NAME: John [name] DOB: Oct 26, 1960 MRN: [id number]  PRIMARY CARE PROVIDER: [name], MD  RESPONSIBLE PARTY:  Acct ID - Guarantor Home Phone Work Phone Relationship Acct Type  [id number] [phone]  Self P/F     [address], [place], [place] [address]   Palliative Care Encounter/Clinical Social Work (10:54 am-11: 14 am)  Person (s) encountered: Patient's wife/PCG-Irene  Purpose of the Encounter/Visit:Follow-up  Assessment: LCSW completed a review and assessment of patient's family/social, mental health and medical history, allergies, medications, and health (functional) status, including patient self-reporting and a review of relevant consultative reports was completed today as part of a comprehensive evaluation by [place] [facility] Work services.  Summary of Encounter: PC SW completed a telephonic encounter with patient's wife to assess his needs, comfort and status. Patient was not available as he was at a doctor' visit. His wife advised that patient is increased shortness of breath with any physical activity. He has a follow-up visit with his pulmonologist next week with the hopes of obtaining portable oxygen. Patient having difficulty with the large tanks. She reports that is appetite is good and his eating 2/3 meals a day. He has pain as a result of arthritis, but it is well-managed with Tylenol. Patient ambulates independently. He completes his own personal care, but has to go slow as he is easily SOB. Patient is a having a scan today to determine what treatments are available for his cancer. His wife advised that patient's body has reacted badly to previous treatments, so she had no idea what would be available to him. SW offered an in-person visit, however his wife request that PC follow-up in 3/4 weeks.  Social Work Interventions Provided: Chart review, assessment of patient  needs and comfort, reassurance of support, obtained a status update on patient; and encouraged wife to call with changes in patient's status/condition.  Collaboration/Coordination of Care:  Palliative care RN updated on encounter.  Plan/Follow-up: Palliative care team will follow-up with patient in 3/4 weeks.  SOCIAL HX: N/A Social History   Tobacco Use   Smoking status: Former    Packs/day: 1.00    Years: 30.00    Total pack years: 30.00    Types: Cigarettes    Quit date: 2018    Years since quitting: 5.9   Smokeless tobacco: Never   Tobacco comments:    Former smoker, quit 2018  Substance Use Topics   Alcohol use: Not Currently   CODE STATUS: DNR ADVANCED DIRECTIVES: No MOST FORM COMPLETE:  No HOSPICE EDUCATION PROVIDED: No  Duration of visit and documents: 30 minutes  [employer], LCSW

## 2022-02-16 ENCOUNTER — Other Ambulatory Visit: Payer: Self-pay | Admitting: Hematology & Oncology

## 2022-02-16 DIAGNOSIS — C3491 Malignant neoplasm of unspecified part of right bronchus or lung: Secondary | ICD-10-CM

## 2022-02-17 ENCOUNTER — Telehealth: Payer: Self-pay

## 2022-02-17 ENCOUNTER — Other Ambulatory Visit: Payer: Self-pay

## 2022-02-17 NOTE — Telephone Encounter (Signed)
-----   Message from Volanda Napoleon, MD sent at 02/16/2022  7:31 PM EST ----- Call - the cancer appears to have very little activity.  However, we need to do a PET scan to see what is going on in the RIGHT upper lung.    Laurey Arrow

## 2022-02-17 NOTE — Telephone Encounter (Signed)
Called and informed patient of results, patient verbalized understanding and denies any questions or concerns at this time.  ? ?

## 2022-02-19 ENCOUNTER — Ambulatory Visit: Payer: Managed Care, Other (non HMO) | Admitting: Primary Care

## 2022-02-19 ENCOUNTER — Encounter: Payer: Self-pay | Admitting: Primary Care

## 2022-02-19 VITALS — BP 126/74 | HR 77 | Wt 240.8 lb

## 2022-02-19 DIAGNOSIS — K219 Gastro-esophageal reflux disease without esophagitis: Secondary | ICD-10-CM

## 2022-02-19 DIAGNOSIS — J9611 Chronic respiratory failure with hypoxia: Secondary | ICD-10-CM | POA: Diagnosis not present

## 2022-02-19 DIAGNOSIS — C3491 Malignant neoplasm of unspecified part of right bronchus or lung: Secondary | ICD-10-CM

## 2022-02-19 DIAGNOSIS — J4489 Other specified chronic obstructive pulmonary disease: Secondary | ICD-10-CM | POA: Diagnosis not present

## 2022-02-19 MED ORDER — PREDNISONE 10 MG PO TABS: ORAL_TABLET | ORAL | 0 refills | Status: AC

## 2022-02-19 MED ORDER — OMEPRAZOLE 20 MG PO CPDR: 20.0000 mg | DELAYED_RELEASE_CAPSULE | Freq: Every day | ORAL | 2 refills | Status: DC

## 2022-02-19 MED ORDER — PAROXETINE HCL 10 MG PO TABS: 10.0000 mg | ORAL_TABLET | Freq: Every day | ORAL | 1 refills | Status: DC

## 2022-02-19 NOTE — Assessment & Plan Note (Signed)
-   Increased shortness of breath last couple of months. He had upper airway wheezing noted on exam.  - CT abdomen showed increased RUL cavitation, differential includes post radiation changes vs enlargement due to lung cancer. He is scheduled for PET CT on January 18th.  - Sending in prednisone taper for post radiation changes

## 2022-02-19 NOTE — Progress Notes (Signed)
@Patient  ID: John [name], male    DOB: 26-Dec-1960, 62 y.o.   MRN: [id number]  Chief Complaint  Patient presents with   Follow-up    Pt is here for follow up for SOB. Pt states he is still SOB but he does not think it is getting worse. Pt wants to talk about a POC and not a tank. Pt is on daily prednisone. No active inhalers noted.     Referring provider: [name], MD  HPI: 62 year old male, former smoker quit in 2018 (30-pack-year history).  Past medical history significant for A. 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 Patient of Dr. [name].   ROV 06/17/21 --62 year old gentleman who follows with today after complicated course post admission for acute right pneumothorax.  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  His right-sided chest tube is now out.  Today he reports that he is feeling better. He is now on chemotherapy with Dr [name]. He is using Anoro qd, can get SOB with exertion. He uses O2 with heavier exertion 2-3L/min. He has tanks, would prefer a POC and has tried to get one, but have not achieved this with DME yet. Minimal to no albuterol use. Planning for repeat CT CAP in a few weeks.   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  He had been managed initially on Keytruda but this was complicated by pneumonitis.  He had tried Anoro but did not get any clinical benefit so this has been discontinued.  He was changed to [facility] in June but never started taking it, also received a prednisone taper at that time.  He has had kyphoplasty, needs another one but unclear when this will be done. He is planning to talk to Dr [name]  about the options for further therapy - he is unsure whether he is going to do this. He just finished a course of doxycycline. He remains on prednisone 5mg    Most recent chest imaging CT 08/20/21 shows right perihilar fibrotic and atelectatic change stable compared with prior, diffuse groundglass attenuation, again unchanged.  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 He is not wearing oxygen today.  He has oxygen concentrator and tanks through [employer] He is interested in getting portable concentrator / O2 86% RA today, needs 2L POC  Denies cough or purulent drainage  CT abdomen pelvic in December 2023 showed increased RUL cavitation, differential includes post radiation changes versus enlargement due to lung cancer. He is scheduled for PET scan January 18th    Allergies  Allergen Reactions   Albuterol Other (See Comments)    Patient has had episode of atrial fib following administration of albuterol (tolerates Xopenex well)   Atorvastatin Other (See Comments)    Causes arthritis pain    Immunization History  Administered Date(s) Administered   PFIZER Comirnaty([place])Covid-19 Tri-Sucrose Vaccine 05/10/2019, 06/04/2019, 12/31/2019    Past  Medical History:  Diagnosis Date   Arthritis    Rheumatoid   Bacteremia due to Pseudomonas    C. difficile colitis    Chronic anxiety 11/08/2014   COVID 2020   mild case   Depression    Drug-induced neutropenia ([place]) 08/11/2020   Family history of adverse reaction to anesthesia    brother with seizures had episode under anesthesia.  Patient has seizures as well.   GERD (gastroesophageal reflux disease)     History of radiation therapy 04/24/2020-05/16/2020   IMRT to right lung     Dr [name]   History of radiation therapy    08/10/21-08/19/21-Dr. [name]   Neutropenia [facility]) 05/12/2020   Neutropenic fever ([place]) 05/12/2020   Normocytic anemia 04/18/2021   PAF (paroxysmal atrial fibrillation) (HCC)    CHADS2VSAC score 3   Rheumatoid aortitis    Secondary hypercoagulable state ([place]) 10/28/2020   Sepsis ([place]) 10/06/2020   Shortness of breath 12/18/2020   Squamous cell lung cancer ([place])     Tobacco History: Social History   Tobacco Use  Smoking Status Former   Packs/day: 1.00   Years: 30.00   Total pack years: 30.00   Types: Cigarettes   Quit date: 2018   Years since quitting: 6.0  Smokeless Tobacco Never  Tobacco Comments   Former smoker, quit 2018   Counseling given: Not Answered Tobacco comments: Former smoker, quit 2018   Outpatient Medications Prior to Visit  Medication Sig Dispense Refill   amiodarone (PACERONE) 200 MG tablet Take 1 tablet (200 mg total) by mouth daily. 90 tablet 3   ELIQUIS 5 MG TABS tablet Take 5 mg by mouth 2 (two) times daily.     Evolocumab (REPATHA SURECLICK) 614 MG/ML SOAJ Inject 140 mg into the skin every 14 (fourteen) days. 6 mL 3   metoprolol succinate (TOPROL-XL) 25 MG 24 hr tablet Take 25 mg by mouth daily.     predniSONE (DELTASONE) 5 MG tablet Take 5 mg by mouth in the morning.     omeprazole (PRILOSEC OTC) 20 MG tablet Take 20 mg by mouth at bedtime.     PARoxetine (PAXIL) 10 MG tablet Take 1 tablet (10 mg total) by mouth at bedtime. 30 tablet 1   No facility-administered medications prior to visit.   Review of Systems  Review of Systems  Constitutional: Negative.   HENT: Negative.    Respiratory:  Positive for shortness of breath and wheezing. Negative for cough.   Cardiovascular: Negative.     Physical Exam  BP 126/74 (BP Location: Left Arm, Patient Position: Sitting, Cuff Size: Normal)   Pulse 77   Wt 240 lb 12.8 oz  (109.2 kg)   SpO2 92%   BMI 33.58 kg/m  Physical Exam Constitutional:      General: He is not in acute distress.    Appearance: Normal appearance. He is not ill-appearing.  HENT:     Head: Normocephalic and atraumatic.     Mouth/Throat:     Mouth: Mucous membranes are moist.     Pharynx: Oropharynx is clear.  Cardiovascular:     Rate and Rhythm: Normal rate and regular rhythm.  Pulmonary:     Effort: Pulmonary effort is normal.     Breath sounds: Normal breath sounds. No wheezing, rhonchi or rales.     Comments: Upper airway wheeze; O2 92% RA Musculoskeletal:        General: Normal range of motion.  Skin:    General: Skin is warm and  dry.  Neurological:     General: No focal deficit present.     Mental Status: He is alert. Mental status is at baseline.  Psychiatric:        Mood and Affect: Mood normal.        Behavior: Behavior normal.        Thought Content: Thought content normal.        Judgment: Judgment normal.      Lab Results:  CBC    Component Value Date/Time   WBC 6.0 02/12/2022 0959   WBC 8.3 10/12/2021 0626   RBC 4.64 02/12/2022 0959   HGB 13.9 02/12/2022 0959   HCT 43.0 02/12/2022 0959   PLT 194 02/12/2022 0959   MCV 92.7 02/12/2022 0959   MCH 30.0 02/12/2022 0959   MCHC 32.3 02/12/2022 0959   RDW 14.5 02/12/2022 0959   LYMPHSABS 1.1 02/12/2022 0959   MONOABS 0.6 02/12/2022 0959   EOSABS 0.3 02/12/2022 0959   BASOSABS 0.0 02/12/2022 0959    BMET    Component Value Date/Time   NA 141 02/12/2022 0959   K 4.3 02/12/2022 0959   CL 103 02/12/2022 0959   CO2 30 02/12/2022 0959   GLUCOSE 124 (H) 02/12/2022 0959   BUN 13 02/12/2022 0959   CREATININE 1.24 02/12/2022 0959   CALCIUM 9.0 02/12/2022 0959   GFRNONAA >60 02/12/2022 0959    BNP    Component Value Date/Time   BNP 34.6 08/20/2021 1139    ProBNP No results found for: "PROBNP"  Imaging: CT CHEST ABDOMEN PELVIS W CONTRAST  Result Date: 02/16/2022 CLINICAL DATA:  Non-small cell  lung cancer. Assess response treatment. Chemotherapy complete. * Tracking Code: BO * EXAM: CT CHEST, ABDOMEN, AND PELVIS WITH CONTRAST TECHNIQUE: Multidetector CT imaging of the chest, abdomen and pelvis was performed following the standard protocol during bolus administration of intravenous contrast. RADIATION DOSE REDUCTION: This exam was performed according to the departmental dose-optimization program which includes automated exposure control, adjustment of the mA and/or kV according to patient size and/or use of iterative reconstruction technique. CONTRAST:  114mL OMNIPAQUE IOHEXOL 300 MG/ML  SOLN COMPARISON:  CT 11/16/2021 FINDINGS: CT CHEST FINDINGS Cardiovascular: Coronary artery calcification and aortic atherosclerotic calcification. Mediastinum/Nodes: No axillary or supraclavicular adenopathy. No mediastinal or hilar adenopathy. No pericardial fluid. Esophagus normal. Lungs/Pleura: Thick-walled cavitary lesion in the RIGHT upper lobe measures 4.3 by 3.9 cm which compares to 2.6 x 1.9 cm comparison exam. There is adjacent interstitial thickening within the RIGHT lower lobe consistent radiation change which is similar to prior. Post radiation interstitial thickening in the RIGHT upper lobe is unchanged. There is volume loss in the RIGHT hemithorax. No suspicious nodules in LEFT lung. Sub pleural reticulation stable. Musculoskeletal: No aggressive osseous lesion. CT ABDOMEN AND PELVIS FINDINGS Hepatobiliary: No focal hepatic lesion. Single large gallstone towards the neck of the gallbladder measures 18 mm. No change. Pancreas: Pancreas is normal. No ductal dilatation. No pancreatic inflammation. Spleen: Normal spleen Adrenals/urinary tract: Enlargement of the RIGHT adrenal gland to 37 mm x 20 mm compares to 41 mm x 24 mm. Kidneys, ureters and bladder normal. Stomach/Bowel: Stomach, small bowel, appendix, and cecum are normal. The colon and rectosigmoid colon are normal. Vascular/Lymphatic: Abdominal aorta is  normal caliber with atherosclerotic calcification. There is no retroperitoneal or periportal lymphadenopathy. No pelvic lymphadenopathy. Reproductive: Unremarkable Other: No free fluid. Musculoskeletal: Chronic compression fractures in the lumbar spine with augmentation. No acute findings IMPRESSION: Chest Impression: 1. The cavity size of the RIGHT upper  lobe cavitation is increased in the interval. Differential includes benign increased cavitation associated with post radiation change versus enlargement due to lung cancer recurrence. Favor the former; however, consider FDG PET scan to evaluate malignant potential. 2. Post radiation change in the RIGHT lung elsewhere is stable. 3. No suspicious nodule in the LEFT lung. 4. No mediastinal lymphadenopathy. Abdomen / Pelvis Impression: 1. RIGHT adrenal gland mass slightly decreased in volume. 2. No evidence of new metastatic disease in the abdomen pelvis. Electronically Signed   By: Suzy Bouchard M.D.   On: 02/16/2022 12:54     Assessment & Plan:   Chronic respiratory failure with hypoxia ([place]) - He qualified for POC today, needs 2L. He will look into self pay options.   Stage 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 (HCC) - Increased shortness of breath last couple of months. He had upper airway wheezing noted on exam.  - CT abdomen showed increased RUL cavitation, differential includes post radiation changes vs enlargement due to lung cancer. He is scheduled for PET CT on January 18th.  - Sending in prednisone taper for post radiation changes    GERD (gastroesophageal reflux disease) - Reflux possibly contributing to wheezing  - Advised patient resume PPI - RX omeprazole 20mg  daily   COPD with asthma ([place]) - Advised patient use albuterol hfa 2 puffs every 12 hours  - Continue prednisone 5mg  daily  - Consider restarting maintenance inhaler if symptoms persist or no better    Martyn Ehrich, NP 02/19/2022

## 2022-02-19 NOTE — Assessment & Plan Note (Addendum)
-   Advised patient use albuterol hfa 2 puffs every 12 hours  - Continue prednisone 5mg  daily  - Consider restarting maintenance inhaler if symptoms persist or no better

## 2022-02-19 NOTE — Assessment & Plan Note (Signed)
-   Reflux possibly contributing to wheezing  - Advised patient resume PPI - RX omeprazole 20mg  daily

## 2022-02-19 NOTE — Patient Instructions (Addendum)
Increasing prednisone for post radiation changes seen on CT imaging No indication for antibiotics since you do not have cough or purulent mucus production Restarting reflux medication cause this can contribute to upper airway wheezing   Recommendations: - Increased prednisone 20mg  daily x 1 week; 15mg  daily x 1 week; 10mg  daily x 1 week (then resume 5mg  daily) - Use albuterol inhaler 2 puffs every morning and evening  - Restart omeprazole 20mg  daily  - Wear oxygen with exertion and at bedtime   Orders: - Qualify for POC (self pay)   Follow-up: - 6-8 weeks with Dr. [name] or sooner if needed

## 2022-02-19 NOTE — Assessment & Plan Note (Signed)
-   He qualified for POC today, needs 2L. He will look into self pay options.

## 2022-03-01 ENCOUNTER — Ambulatory Visit: Payer: Managed Care, Other (non HMO) | Attending: Cardiology

## 2022-03-01 ENCOUNTER — Inpatient Hospital Stay: Payer: Managed Care, Other (non HMO)

## 2022-03-01 ENCOUNTER — Inpatient Hospital Stay: Payer: Managed Care, Other (non HMO) | Admitting: Hematology & Oncology

## 2022-03-01 DIAGNOSIS — E785 Hyperlipidemia, unspecified: Secondary | ICD-10-CM

## 2022-03-01 DIAGNOSIS — I4819 Other persistent atrial fibrillation: Secondary | ICD-10-CM

## 2022-03-01 DIAGNOSIS — I2510 Atherosclerotic heart disease of native coronary artery without angina pectoris: Secondary | ICD-10-CM

## 2022-03-01 LAB — COMPREHENSIVE METABOLIC PANEL
ALT: 14 IU/L (ref 0–44)
AST: 18 IU/L (ref 0–40)
Albumin/Globulin Ratio: 1.6 (ref 1.2–2.2)
Albumin: 3.9 g/dL (ref 3.8–4.9)
Alkaline Phosphatase: 111 IU/L (ref 44–121)
BUN/Creatinine Ratio: 10 (ref 10–24)
BUN: 11 mg/dL (ref 8–27)
Bilirubin Total: 0.5 mg/dL (ref 0.0–1.2)
CO2: 26 mmol/L (ref 20–29)
Calcium: 8.7 mg/dL (ref 8.6–10.2)
Chloride: 104 mmol/L (ref 96–106)
Creatinine, Ser: 1.09 mg/dL (ref 0.76–1.27)
Globulin, Total: 2.4 g/dL (ref 1.5–4.5)
Glucose: 91 mg/dL (ref 70–99)
Potassium: 4 mmol/L (ref 3.5–5.2)
Sodium: 141 mmol/L (ref 134–144)
Total Protein: 6.3 g/dL (ref 6.0–8.5)
eGFR: 78 mL/min/{1.73_m2} (ref >59)

## 2022-03-01 LAB — LIPID PANEL
Chol/HDL Ratio: 6.2 ratio — ABNORMAL HIGH (ref 0.0–5.0)
Cholesterol, Total: 191 mg/dL (ref 100–199)
HDL: 31 mg/dL — ABNORMAL LOW (ref >39)
LDL Chol Calc (NIH): 136 mg/dL — ABNORMAL HIGH (ref 0–99)
Triglycerides: 133 mg/dL (ref 0–149)
VLDL Cholesterol Cal: 24 mg/dL (ref 5–40)

## 2022-03-01 LAB — TSH: TSH: 3.18 u[IU]/mL (ref 0.450–4.500)

## 2022-03-02 ENCOUNTER — Other Ambulatory Visit: Payer: Self-pay | Admitting: Primary Care

## 2022-03-02 MED ORDER — BREZTRI AEROSPHERE 160-9-4.8 MCG/ACT IN AERO: 2.0000 | INHALATION_SPRAY | Freq: Two times a day (BID) | RESPIRATORY_TRACT | 5 refills | Status: DC

## 2022-03-02 NOTE — Telephone Encounter (Signed)
Restarting Breztri d/t wheezing RX sent

## 2022-03-04 ENCOUNTER — Encounter (HOSPITAL_COMMUNITY)
Admission: RE | Admit: 2022-03-04 | Discharge: 2022-03-04 | Disposition: A | Discharge status: Home / Self Care | Payer: Managed Care, Other (non HMO) | Source: Ambulatory Visit | Attending: Hematology & Oncology | Admitting: Hematology & Oncology

## 2022-03-04 DIAGNOSIS — C3491 Malignant neoplasm of unspecified part of right bronchus or lung: Secondary | ICD-10-CM | POA: Insufficient documentation

## 2022-03-04 LAB — GLUCOSE, CAPILLARY: Glucose-Capillary: 99 mg/dL (ref 70–99)

## 2022-03-04 MED ORDER — FLUDEOXYGLUCOSE F - 18 (FDG) INJECTION: 12.0000 | Freq: Once | INTRAVENOUS | Status: DC

## 2022-03-04 MED ORDER — FLUDEOXYGLUCOSE F - 18 (FDG) INJECTION
11.9700 | Freq: Once | INTRAVENOUS | Status: AC
  Administered 2022-03-04: 11.97 via INTRAVENOUS

## 2022-03-05 ENCOUNTER — Telehealth: Payer: Self-pay | Admitting: Pharmacist

## 2022-03-05 NOTE — Telephone Encounter (Signed)
Spoke to patient about his labs.  He has not started Repatha yet.  He wanted to wait until he got off some of his meds for cancer.  Finally got off the steroids.  He has stage IV lung cancer it is stable and not spreading.  He is ready to start Repatha.  He is going out of town, but will start it next week when he gets back.  I will contact him in 2 months to set up labs.

## 2022-03-12 ENCOUNTER — Encounter: Payer: Managed Care, Other (non HMO) | Admitting: Student

## 2022-03-16 ENCOUNTER — Ambulatory Visit (INDEPENDENT_AMBULATORY_CARE_PROVIDER_SITE_OTHER): Payer: Managed Care, Other (non HMO) | Admitting: Student

## 2022-03-16 ENCOUNTER — Encounter: Payer: Self-pay | Admitting: Hematology & Oncology

## 2022-03-16 ENCOUNTER — Inpatient Hospital Stay (HOSPITAL_BASED_OUTPATIENT_CLINIC_OR_DEPARTMENT_OTHER): Payer: Managed Care, Other (non HMO) | Admitting: Hematology & Oncology

## 2022-03-16 ENCOUNTER — Encounter: Payer: Self-pay | Admitting: Student

## 2022-03-16 ENCOUNTER — Inpatient Hospital Stay: Payer: Managed Care, Other (non HMO) | Attending: Internal Medicine

## 2022-03-16 ENCOUNTER — Other Ambulatory Visit: Payer: Self-pay

## 2022-03-16 VITALS — BP 116/67 | HR 76 | Temp 97.8°F | Ht 72.0 in | Wt 250.0 lb

## 2022-03-16 VITALS — BP 115/62 | HR 82 | Temp 98.3°F | Resp 18 | Ht 72.0 in | Wt 248.0 lb

## 2022-03-16 DIAGNOSIS — E782 Mixed hyperlipidemia: Secondary | ICD-10-CM

## 2022-03-16 DIAGNOSIS — C3491 Malignant neoplasm of unspecified part of right bronchus or lung: Secondary | ICD-10-CM | POA: Diagnosis not present

## 2022-03-16 DIAGNOSIS — I4891 Unspecified atrial fibrillation: Secondary | ICD-10-CM | POA: Insufficient documentation

## 2022-03-16 DIAGNOSIS — C779 Secondary and unspecified malignant neoplasm of lymph node, unspecified: Secondary | ICD-10-CM | POA: Diagnosis not present

## 2022-03-16 DIAGNOSIS — F419 Anxiety disorder, unspecified: Secondary | ICD-10-CM

## 2022-03-16 DIAGNOSIS — C7971 Secondary malignant neoplasm of right adrenal gland: Secondary | ICD-10-CM | POA: Insufficient documentation

## 2022-03-16 DIAGNOSIS — C3411 Malignant neoplasm of upper lobe, right bronchus or lung: Secondary | ICD-10-CM | POA: Diagnosis present

## 2022-03-16 LAB — CMP (CANCER CENTER ONLY)
ALT: 11 U/L (ref 0–44)
AST: 15 U/L (ref 15–41)
Albumin: 4.1 g/dL (ref 3.5–5.0)
Alkaline Phosphatase: 77 U/L (ref 38–126)
Anion gap: 9 (ref 5–15)
BUN: 10 mg/dL (ref 6–20)
CO2: 29 mmol/L (ref 22–32)
Calcium: 9.3 mg/dL (ref 8.9–10.3)
Chloride: 104 mmol/L (ref 98–111)
Creatinine: 1.29 mg/dL — ABNORMAL HIGH (ref 0.61–1.24)
GFR, Estimated: >60 mL/min (ref >60)
Glucose, Bld: 93 mg/dL (ref 70–99)
Potassium: 4.3 mmol/L (ref 3.5–5.1)
Sodium: 142 mmol/L (ref 135–145)
Total Bilirubin: 0.6 mg/dL (ref 0.3–1.2)
Total Protein: 6.7 g/dL (ref 6.5–8.1)

## 2022-03-16 LAB — CBC WITH DIFFERENTIAL (CANCER CENTER ONLY)
Abs Immature Granulocytes: 0.03 10*3/uL (ref 0.00–0.07)
Basophils Absolute: 0 10*3/uL (ref 0.0–0.1)
Basophils Relative: 0 %
Eosinophils Absolute: 0.3 10*3/uL (ref 0.0–0.5)
Eosinophils Relative: 5 %
HCT: 40.6 % (ref 39.0–52.0)
Hemoglobin: 13.4 g/dL (ref 13.0–17.0)
Immature Granulocytes: 0 %
Lymphocytes Relative: 15 %
Lymphs Abs: 1 10*3/uL (ref 0.7–4.0)
MCH: 30.5 pg (ref 26.0–34.0)
MCHC: 33 g/dL (ref 30.0–36.0)
MCV: 92.5 fL (ref 80.0–100.0)
Monocytes Absolute: 0.9 10*3/uL (ref 0.1–1.0)
Monocytes Relative: 14 %
Neutro Abs: 4.5 10*3/uL (ref 1.7–7.7)
Neutrophils Relative %: 66 %
Platelet Count: 207 10*3/uL (ref 150–400)
RBC: 4.39 MIL/uL (ref 4.22–5.81)
RDW: 14.2 % (ref 11.5–15.5)
WBC Count: 6.8 10*3/uL (ref 4.0–10.5)
nRBC: 0 % (ref 0.0–0.2)

## 2022-03-16 LAB — LACTATE DEHYDROGENASE: LDH: 169 U/L (ref 98–192)

## 2022-03-16 MED ORDER — PAROXETINE HCL 10 MG PO TABS: 10.0000 mg | ORAL_TABLET | Freq: Every day | ORAL | 2 refills | Status: DC

## 2022-03-16 NOTE — Progress Notes (Signed)
   CC: medication refill  HPI:  [name] is a 62 y.o. male with history listed below presenting to the [facility] for medication refill. Please see individualized problem based charting for full HPI.  Past Medical History:  Diagnosis Date   Arthritis    Rheumatoid   Bacteremia due to Pseudomonas    C. difficile colitis    Chronic anxiety 11/08/2014   COVID 2020   mild case   Depression    Drug-induced neutropenia ([place]) 08/11/2020   Family history of adverse reaction to anesthesia    brother with seizures had episode under anesthesia.  Patient has seizures as well.   GERD (gastroesophageal reflux disease)    History of radiation therapy 04/24/2020-05/16/2020   IMRT to right lung     Dr [name]   History of radiation therapy    08/10/21-08/19/21-Dr. [name]   Neutropenia [facility]) 05/12/2020   Neutropenic fever ([place]) 05/12/2020   Normocytic anemia 04/18/2021   PAF (paroxysmal atrial fibrillation) ([facility])    CHADS2VSAC score 3   Rheumatoid aortitis    Secondary hypercoagulable state ([place]) 10/28/2020   Sepsis ([place]) 10/06/2020   Shortness of breath 12/18/2020   Squamous cell lung cancer ([place])     Review of Systems:  Negative aside from that listed in individualized problem based charting.  Physical Exam:  Vitals:   03/16/22 1031  BP: 116/67  Pulse: 76  Temp: 97.8 F (36.6 C)  TempSrc: Oral  SpO2: 94%  Weight: 250 lb (113.4 kg)  Height: 6' (1.829 m)   Physical Exam Constitutional:      Appearance: Normal appearance. He is obese. He is not ill-appearing.  HENT:     Mouth/Throat:     Mouth: Mucous membranes are moist.     Pharynx: Oropharynx is clear.  Eyes:     Extraocular Movements: Extraocular movements intact.     Conjunctiva/sclera: Conjunctivae normal.     Pupils: Pupils are equal, round, and reactive to light.  Cardiovascular:     Rate and Rhythm: Normal rate and regular rhythm.     Heart sounds: Normal heart sounds. No murmur heard.    No gallop.   Pulmonary:     Effort: Pulmonary effort is normal. No respiratory distress.     Breath sounds: No wheezing or rales.  Abdominal:     General: Bowel sounds are normal. There is no distension.     Palpations: Abdomen is soft.     Tenderness: There is no abdominal tenderness.  Musculoskeletal:        General: No swelling. Normal range of motion.  Skin:    General: Skin is warm and dry.  Neurological:     General: No focal deficit present.     Mental Status: He is alert and oriented to person, place, and time.  Psychiatric:        Mood and Affect: Mood normal.        Behavior: Behavior normal.      Assessment & Plan:   See Encounters Tab for problem based charting.  Patient discussed with Dr. Heber Ostrander

## 2022-03-16 NOTE — Assessment & Plan Note (Signed)
Reports good response to paroxetine 10mg  daily. Has been on a steady dose for a long period of time and would like to continue it.  -refilled paxil

## 2022-03-16 NOTE — Patient Instructions (Signed)
Mr. Boateng,  It was a pleasure seeing you in the clinic today.   I have refilled your Paxil.  I am happy to hear that you are feeling well today. I will schedule a follow up visit in 3 months with me.  Please call our clinic at [phone] if you have any questions or concerns. The best time to call is Monday-Friday from 9am-4pm, but there is someone available 24/7 at the same number. If you need medication refills, please notify your pharmacy one week in advance and they will send [place] a request.   Thank you for letting us take part in your care. We look forward to seeing you next time!

## 2022-03-16 NOTE — Progress Notes (Signed)
+ Hematology and Oncology Follow Up Visit  Ayodele Sangalang [id number] Jan 04, 1961 62 y.o. 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 Kenyon is here today for follow-up.  He continues to gain weight.  I think this means he is eating well.  I am certainly grateful for this.  I think the weight gain is certainly a decent indicator as to how he is doing.  We did do a PET scan on him.  This was done on 03/04/2022.  The PET scan showed that there may be some disease progression.  However, there really is nothing new that we see on the PET scan.  He does have the cavitary right parahilar lung lesion.  Again this appears to be stable in size.  He does have the right adrenal lesion with some hypermetabolism.  There is a lesion at the left T5 pedicle.  This might be something that we need to think about radiating.  Overall, his quality of life is doing well.  We try to hold off on treatment as long as possible.  I will think he has had any treatment probably for about 7 months.  He is still working.  Actually, he and his wife got back from [place].  They drove down there.  A company event occurred.  He enjoyed himself down there.  He ate some good food.  He really does need to have a bisphosphonate.  I think his bones would benefit from this.    His atrial fibrillation seems to be under very good control.  He is on amiodarone.  Currently, I would say that his performance status is probably ECOG 1.       Medications:  Allergies as of 03/16/2022       Reactions   Albuterol Other (See Comments)   Patient has had  episode of atrial fib following administration of albuterol (tolerates Xopenex well)   Atorvastatin Other (See Comments)   Causes arthritis pain        Medication List        Accurate as of March 16, 2022  1:03 PM. If you have any questions, ask your nurse or doctor.          amiodarone 200 MG tablet Commonly known as: Pacerone Take 1 tablet (200 mg total) by mouth daily.   Breztri Aerosphere 160-9-4.8 MCG/ACT Aero Generic drug: Budeson-Glycopyrrol-Formoterol Inhale 2 puffs into the lungs in the morning and at bedtime.   Eliquis 5 MG Tabs tablet Generic drug: apixaban Take 5 mg by mouth 2 (two) times daily.   metoprolol succinate 25 MG 24 hr tablet Commonly known as: TOPROL-XL Take 25 mg by mouth daily.   omeprazole 20 MG capsule Commonly known as: PRILOSEC Take 1 capsule (20 mg total) by mouth daily.   PARoxetine 10 MG tablet Commonly known as: PAXIL Take 1 tablet (10 mg total) by mouth at bedtime.   predniSONE 5 MG tablet Commonly known as: DELTASONE Take 5 mg by mouth in the morning.   Repatha SureClick 546 MG/ML Soaj Generic drug: Evolocumab Inject 140  mg into the skin every 14 (fourteen) days.        Allergies:  Allergies  Allergen Reactions   Albuterol Other (See Comments)    Patient has had episode of atrial fib following administration of albuterol (tolerates Xopenex well)   Atorvastatin Other (See Comments)    Causes arthritis pain    Past Medical History, Surgical history, Social history, and Family History were reviewed and updated.  Review of Systems: Review of Systems  Constitutional:  Positive for malaise/fatigue.  HENT: Negative.    Eyes: Negative.   Respiratory:  Positive for cough and shortness of breath.   Cardiovascular:  Positive for chest pain.  Gastrointestinal: Negative.   Genitourinary: Negative.   Musculoskeletal:  Positive for joint pain.  Skin: Negative.   Neurological:  Positive for focal weakness.   Endo/Heme/Allergies: Negative.   Psychiatric/Behavioral: Negative.       Physical Exam:  height is 6' (1.829 m) and weight is 248 lb (112.5 kg). His oral temperature is 98.3 F (36.8 C). His blood pressure is 115/62 and his pulse is 82. His respiration is 18 and oxygen saturation is 94%.   Wt Readings from Last 3 Encounters:  03/16/22 248 lb (112.5 kg)  03/16/22 250 lb (113.4 kg)  02/19/22 240 lb 12.8 oz (109.2 kg)  Vital signs are temperature of 98.4.  Pulse 94.  Blood pressure 116/75.  Weight is 233 pounds.  Physical Exam Vitals reviewed.  HENT:     Head: Normocephalic and atraumatic.  Eyes:     Pupils: Pupils are equal, round, and reactive to light.  Cardiovascular:     Rate and Rhythm: Normal rate and regular rhythm.     Heart sounds: Normal heart sounds.  Pulmonary:     Effort: Pulmonary effort is normal.     Breath sounds: Normal breath sounds.  Abdominal:     General: Bowel sounds are normal.     Palpations: Abdomen is soft.  Musculoskeletal:        General: No tenderness or deformity. Normal range of motion.     Cervical back: Normal range of motion.  Lymphadenopathy:     Cervical: No cervical adenopathy.  Skin:    General: Skin is warm and dry.     Findings: No erythema or rash.  Neurological:     Mental Status: He is alert and oriented to person, place, and time.  Psychiatric:        Behavior: Behavior normal.        Thought Content: Thought content normal.        Judgment: Judgment normal.    Lab Results  Component Value Date   WBC 6.8 03/16/2022   HGB 13.4 03/16/2022   HCT 40.6 03/16/2022   MCV 92.5 03/16/2022   PLT 207 03/16/2022   Lab Results  Component Value Date   FERRITIN 446 (H) 08/23/2021   IRON 189 (H) 08/26/2021   TIBC 213 (L) 08/26/2021   UIBC 24 08/26/2021   IRONPCTSAT 89 (H) 08/26/2021   Lab Results  Component Value Date   RETICCTPCT 1.9 08/23/2021   RBC 4.39 03/16/2022   No results found for: "KPAFRELGTCHN", "LAMBDASER",  "KAPLAMBRATIO" No results found for: "IGGSERUM", "IGA", "IGMSERUM" No results found for: "TOTALPROTELP", "ALBUMINELP", "A1GS", "A2GS", "BETS", "BETA2SER", "GAMS", "MSPIKE", "SPEI"   Chemistry      Component Value Date/Time   NA 142 03/16/2022 1159   NA 141 03/01/2022 0749   K 4.3 03/16/2022 1159   CL 104 03/16/2022 1159  CO2 29 03/16/2022 1159   BUN 10 03/16/2022 1159   BUN 11 03/01/2022 0749   CREATININE 1.29 (H) 03/16/2022 1159      Component Value Date/Time   CALCIUM 9.3 03/16/2022 1159   ALKPHOS 77 03/16/2022 1159   AST 15 03/16/2022 1159   ALT 11 03/16/2022 1159   BILITOT 0.6 03/16/2022 1159       Impression and Plan: Mr. Auman is a very pleasant 62 yo gentleman with metastatic squamous cell carcinoma of the right lung.  The tumor had high PD-L1 score.  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  For right now, this is all about quality of life.  I think that the PET scan is encouraging.  Again, he does have some disease activity.  However, I do not see anything that looks new.  We will see if radiation therapy might help with his T5 lesion.  I left a message for Dr. [name] of Radiation Oncology.  For right now, I do need to set him up with some Xgeva.  I will try to start this when we see him back in February.  Again, he wants quality of life.  I think this is what he is most.  Important to him.  I totally agree with this.    Volanda Napoleon, MD 1/30/20241:03 PM

## 2022-03-16 NOTE — Assessment & Plan Note (Signed)
Regularly follows cardiology. Currently on Repatha, tolerating well.

## 2022-03-17 ENCOUNTER — Telehealth: Payer: Self-pay | Admitting: Radiation Oncology

## 2022-03-17 NOTE — Telephone Encounter (Signed)
1/31 @ 1:46 PM Left voicemail for patient to call our office to be schedule for consult with Dr. [name].

## 2022-03-18 NOTE — Progress Notes (Incomplete)
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 Imaging features concerning for recurrent disease. Stable bilobed right adrenal lesion demonstrates a focus of marked hypermetabolism, consistent with metastatic disease. Small hypermetabolic focus in the left T5 pedicle without a definite underlying bony abnormality on CT imaging. Metastatic disease a concern. Cholelithiasis. 3.3 cm abdominal aortic aneurysm. Attention on follow-up recommended. Aortic Atherosclerosis (ICD10-I70.0) and Emphysema (ICD10-J43.9).  Location(s) of Symptomatic Metastases: ***  Past/Anticipated chemotherapy by medical oncology, if any:  Under care of Dr. Burney Gauze  03/16/2022 --Current Therapy:        Pembrolizumab 200 mg IV every 3 weeks -- s/p cycle 10 -- d/c due to pulmonary toxicity Taxotere 60mg /m2 IV q 3 weeks -- s/p cycle #5 -- start on 01/29/2021 --DC on 07/20/2021 due to progression Gemzar/Navelbine -- start cycle #1 on 09/20/2021 - on hold --Impression and Plan:  Mr. Wakeley is a very pleasant 62 yo gentleman with metastatic squamous cell carcinoma of the right lung.   The tumor had high PD-L1 score.   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 For right now, this is all about quality of life.   I think that the PET scan is encouraging.   Again, he does have some disease activity.  However, I do not see anything that looks new. We will see if radiation therapy might help with his T5 lesion.   I left a message for Dr. [name] of Radiation Oncology. For right now, I do need to set him up with some Xgeva.   I will try to start this when we see him back in February. Again,  he wants quality of life.  I think this is what he is most.  Important to him.  I totally agree with this.   Pain on a scale of 0-10 is: ***   If Spine Met(s), symptoms, if any, include: Bowel/Bladder retention or incontinence (please describe): *** Numbness or weakness in extremities (please describe): *** Current Decadron regimen, if applicable: Not currently prescribed  Ambulatory status? Walker? Wheelchair?: ***  SAFETY ISSUES: Prior radiation? Yes   Pacemaker/ICD? No Possible current pregnancy? N/A Is the patient on methotrexate? No  Current Complaints / other details:  ***

## 2022-03-19 NOTE — Progress Notes (Signed)
Internal Medicine Clinic Attending  Case discussed with the resident at the time of the visit.  We reviewed the resident's history and exam and pertinent patient test results.  I agree with the assessment, diagnosis, and plan of care documented in the resident's note.  

## 2022-03-20 NOTE — Progress Notes (Signed)
Radiation Oncology         [phone]) [phone] ________________________________  Outpatient Re-Consultation  Name: John [name] MRN: [id number]  Date: 03/22/2022  DOB: Jul 07, 1960  YN:WGNFAOZ, Soyla Murphy, MD  Volanda Napoleon, MD   REFERRING PHYSICIAN: Volanda Napoleon, MD  DIAGNOSIS: There were no encounter diagnoses.  T5 lesion concerning for metastatic disease  The primary encounter diagnosis was Metastasis from malignant neoplasm of right lung ([place]). 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 male who is accompanied by ***. he is seen as a courtesy of [name] for re-evaluation and an opinion concerning radiation therapy as part of management for his recently diagnosed T5 lesion. The patient was last seen by radiation oncology in July 2023 for his final radiation treatment to the adrenal gland lesion. Radiation was discontinued early due to pain associated with a back injury (detailed below).  Since completing radiation, the patient was hospitalized from 08/23/21 through 08/29/21 for management of an acute onset T12 compression fracture. CT of the abdomen and pelvis performed in the ED several days prior to admission on 08/20/21 (for evaluation of right lower back pain) initially revealed the new T12 compression deformity, as well as a new superior endplate deformity of L3 vertebral body. CT also showed stability of the right adrenal mass, measuring 5.6 cm. Although there was valid concern that his T12 fracture was secondary to metastatic lung cancer, MRI of the lumbar spine also performed that same date showed no definite evidence of underlying lesion to correlate with the T12 or L3 compression fractures.   He also underwent a bone marrow biopsy of T12 on 09/07/21 which confirmed a benign etiology. The patient also had a T12 kyphoplasty performed on 30/86 which was complicated due to issues with anesthesia and subsequent inability to address the L3 fracture.   The complications from his kyphoplasty and persistent severe lower back pain deterred the patient from pursuing any chemotherapy at the time.   CT of the lumbar spine on 10/02/2021 showed a new L1 vertebral body compression fracture, and interval stability of the chronic L3 vertebral body compression fracture.   The patient was finally able to have a kyphoplasty to L1 and L3 performed on 10/12/21. Biopsies of L1 and L3 were also collected and showed no evidence of malignancy.  The patient felt significantly better following surgery. Given so, the patient understandably opted against further treatment at the time so that he could focus on his quality of life.  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 CT also showed no persistently enlarged mediastinal lymph nodes or evidence of new metastatic disease in the chest, abdomen, or pelvis.       Given that the patient was overall asymptomatic, without evidence of disease progression, and so as to preserve his quality of life, Dr.  Ennever advised proceeding with surveillance imaging.     Follow-up CT CAP on 02/12/22 showed an interval increase in size of the right upper lobe cavitation. Differential  considerations at the time included increased cavitation associated with post radiation change versus enlargement due to lung cancer recurrence. CT otherwise showed: stable post-RT changes in the right lung, a slight decrease in volume of the right adrenal gland mass, no mediastinal lymphadenopathy, and no evidence of new metastatic disease in the abdomen or pelvis.     Restaging PET scan on 03/04/22 showed concern for recurrent disease, demonstrated by peripheral hypermetabolism associated with the cavitary right parahilar lung lesion, especially around the lateral margin; stability of the bilobed right adrenal lesion with a focus of marked hypermetabolism, consistent with metastatic disease; a small hypermetabolic focus in the left T5 pedicle concerning for metastatic disease; and a 3.3 cm abdominal aortic aneurysm.     For the T5 lesion, [name] has recommended consideration of radiation therapy. He would also like to try the patient on Xgeva. [name] would otherwise like to hold off on other forms of treatment for as long as possible given that there are no other new sites sites of disease.    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 IIIb/IV (T4, N2, M0/M1, C) squamous cell carcinoma of the right upper lobe    Indication for treatment:  Curative      Radiation treatment dates:  4 treatment sessions on: 08/12/2021, 08/13/2021, 08/17/2021, 08/19/2021 Site/dose: Abdomen - 5.00Gy/Fx delivered in 6 fractions for a total of 30.00Gy (the patient was initially planned to receive 50.00Gy in 10 fractions) Beams/energy:  6X-FFF (ultra hypofractionated radiation therapy)  Intent: Curative  Radiation Treatment Dates: 04/24/2020 through 05/16/2020 Site Technique Total Dose (Gy) Dose per Fx (Gy) Completed Fx Beam Energies  Lung, Right: Lung_Rt 3D 28/28 2 14/14 6X, 10X    PAST MEDICAL HISTORY:   Past Medical History:  Diagnosis Date   Arthritis    Rheumatoid   Bacteremia due to Pseudomonas    C. difficile colitis    Chronic anxiety 11/08/2014   Cough 09/22/2021   COVID 2020   mild case   Depression    Drug-induced neutropenia ([place]) 08/11/2020   Family history of adverse reaction to anesthesia    brother with seizures had episode under anesthesia.  Patient has seizures as well.   GERD (gastroesophageal reflux disease)    History of radiation therapy 04/24/2020-05/16/2020   IMRT to right lung     Dr [name]   History of radiation therapy    08/10/21-08/19/21-Dr. [name]   Neutropenia [facility]) 05/12/2020   Neutropenic fever ([place]) 05/12/2020   Normocytic anemia 04/18/2021   PAF (paroxysmal atrial fibrillation) ([facility])    CHADS2VSAC score 3   Rheumatoid aortitis    Secondary hypercoagulable state ([place]) 10/28/2020   Sepsis ([place]) 10/06/2020   Shortness of breath 12/18/2020   Squamous cell lung cancer ([place])     PAST SURGICAL HISTORY: Past Surgical History:  Procedure Laterality Date   BRONCHIAL BRUSHINGS  03/27/2020   Procedure: BRONCHIAL BRUSHINGS;  Surgeon: [name], MD;  Location: [place];  Service: Cardiopulmonary;;   BUBBLE STUDY  10/13/2020   Procedure: BUBBLE STUDY;  Surgeon: Pixie Casino, MD;  Location: [place];  Service: Cardiovascular;;   CARDIOVERSION N/A 10/13/2020   Procedure: CARDIOVERSION;  Surgeon: Pixie Casino, MD;  Location: [place];  Service:  Cardiovascular;  Laterality: N/A;   CATARACT EXTRACTION  2016   at [place]  03/27/2020   Procedure: FINE NEEDLE ASPIRATION;  Surgeon: [name], MD;  Location: [place];  Service: Cardiopulmonary;;   HIP SURGERY Left    IR KYPHO EA ADDL LEVEL THORACIC OR LUMBAR  10/12/2021   IR KYPHO LUMBAR INC FX REDUCE BONE BX UNI/BIL CANNULATION INC/IMAGING  10/12/2021   IR KYPHO THORACIC WITH BONE BIOPSY  09/07/2021   IR RADIOLOGIST EVAL & MGMT  09/03/2021    RADIOLOGY WITH ANESTHESIA N/A 10/12/2021   Procedure: L1 and L3 Kyphoplasty;  Surgeon: Pedro Earls, MD;  Location: [place];  Service: Radiology;  Laterality: N/A;   TEE WITHOUT CARDIOVERSION N/A 10/13/2020   Procedure: TRANSESOPHAGEAL ECHOCARDIOGRAM (TEE);  Surgeon: Pixie Casino, MD;  Location: [place];  Service: Cardiovascular;  Laterality: N/A;   VIDEO BRONCHOSCOPY WITH ENDOBRONCHIAL ULTRASOUND N/A 03/27/2020   Procedure: VIDEO BRONCHOSCOPY WITH ENDOBRONCHIAL ULTRASOUND;  Surgeon: [name], MD;  Location: [place];  Service: Cardiopulmonary;  Laterality: N/A;    FAMILY HISTORY:  Family History  Problem Relation Age of Onset   Arrhythmia Mother        has PPM   Heart disease Father        Died at 26, started in his 66s, heart attacks, had PPM and ICD    SOCIAL HISTORY:  Social History   Tobacco Use   Smoking status: Former    Packs/day: 1.00    Years: 30.00    Total pack years: 30.00    Types: Cigarettes    Quit date: 2018    Years since quitting: 6.0   Smokeless tobacco: Never   Tobacco comments:    Former smoker, quit 2018  Vaping Use   Vaping Use: Never used  Substance Use Topics   Alcohol use: Not Currently   Drug use: Never    ALLERGIES:  Allergies  Allergen Reactions   Albuterol Other (See Comments)    Patient has had episode of atrial fib following administration of albuterol (tolerates Xopenex well)   Atorvastatin Other (See Comments)    Causes arthritis pain    MEDICATIONS:  Current Outpatient Medications  Medication Sig Dispense Refill   amiodarone (PACERONE) 200 MG tablet Take 1 tablet (200 mg total) by mouth daily. 90 tablet 3   Budeson-Glycopyrrol-Formoterol (BREZTRI AEROSPHERE) 160-9-4.8 MCG/ACT AERO Inhale 2 puffs into the lungs in the morning and at bedtime. 10.7 g 5   ELIQUIS 5 MG TABS tablet Take 5 mg by mouth 2 (two) times daily.     Evolocumab (REPATHA SURECLICK) 409 MG/ML SOAJ Inject 140 mg into the skin every  14 (fourteen) days. 6 mL 3   metoprolol succinate (TOPROL-XL) 25 MG 24 hr tablet Take 25 mg by mouth daily.     omeprazole (PRILOSEC) 20 MG capsule Take 1 capsule (20 mg total) by mouth daily. 30 capsule 2   PARoxetine (PAXIL) 10 MG tablet Take 1 tablet (10 mg total) by mouth at bedtime. 90 tablet 2   predniSONE (DELTASONE) 5 MG tablet Take 5 mg by mouth in the morning.     No current facility-administered medications for this encounter.    REVIEW OF SYSTEMS:  A 10+ POINT REVIEW OF SYSTEMS WAS OBTAINED including neurology, dermatology, psychiatry, cardiac, respiratory, lymph, extremities, GI, GU, musculoskeletal, constitutional, reproductive, HEENT. ***   PHYSICAL EXAM:  vitals were not taken for this visit.   General: Alert and oriented,  in no acute distress HEENT: Head is normocephalic. Extraocular movements are intact. Oropharynx is clear. Neck: Neck is supple, no palpable cervical or supraclavicular lymphadenopathy. Heart: Regular in rate and rhythm with no murmurs, rubs, or gallops. Chest: Clear to auscultation bilaterally, with no rhonchi, wheezes, or rales. Abdomen: Soft, nontender, nondistended, with no rigidity or guarding. Extremities: No cyanosis or edema. Lymphatics: see Neck Exam Skin: No concerning lesions. Musculoskeletal: symmetric strength and muscle tone throughout. Neurologic: Cranial nerves II through XII are grossly intact. No obvious focalities. Speech is fluent. Coordination is intact. Psychiatric: Judgment and insight are intact. Affect is appropriate. ***  ECOG = ***  0 - Asymptomatic (Fully active, able to carry on all predisease activities without restriction)  1 - Symptomatic but completely ambulatory (Restricted in physically strenuous activity but ambulatory and able to carry out work of a light or sedentary nature. For example, light housework, office work)  2 - Symptomatic, <50% in bed during the day (Ambulatory and capable of all self care but unable  to carry out any work activities. Up and about more than 50% of waking hours)  3 - Symptomatic, >50% in bed, but not bedbound (Capable of only limited self-care, confined to bed or chair 50% or more of waking hours)  4 - Bedbound (Completely disabled. Cannot carry on any self-care. Totally confined to bed or chair)  5 - Death   Eustace Pen MM, Creech RH, Tormey DC, et al. [phone]). "Toxicity and response criteria of the [name]". [place] Oncol. 5 (6): 649-55  LABORATORY DATA:  Lab Results  Component Value Date   WBC 6.8 03/16/2022   HGB 13.4 03/16/2022   HCT 40.6 03/16/2022   MCV 92.5 03/16/2022   PLT 207 03/16/2022   NEUTROABS 4.5 03/16/2022   Lab Results  Component Value Date   NA 142 03/16/2022   K 4.3 03/16/2022   CL 104 03/16/2022   CO2 29 03/16/2022   GLUCOSE 93 03/16/2022   BUN 10 03/16/2022   CREATININE 1.29 (H) 03/16/2022   CALCIUM 9.3 03/16/2022      RADIOGRAPHY: NM PET Image Restag (PS) Skull Base To Thigh  Result Date: 03/04/2022 CLINICAL DATA:  Subsequent treatment strategy for non-small cell lung cancer. EXAM: NUCLEAR MEDICINE PET SKULL BASE TO THIGH TECHNIQUE: 12.0 mCi F-18 FDG was injected intravenously. Full-ring PET imaging was performed from the skull base to thigh after the radiotracer. CT data was obtained and used for attenuation correction and anatomic localization. Fasting blood glucose: 99 mg/dl COMPARISON:  Chest abdomen pelvis CT 02/12/2022.  PET-CT 04/29/2020 FINDINGS: Mediastinal blood pool activity: SUV max 2.4 Liver activity: SUV max NA NECK: No hypermetabolic lymph nodes in the neck. Incidental CT findings: None. CHEST: Hypermetabolism is noted in the periphery of the cavitary right parahilar lesion with the most prominent hypermetabolism in the lateral wall along the pleura ( SUV max = 6.3). No evidence for hypermetabolic lymphadenopathy in the chest. No additional hypermetabolic pulmonary nodule or mass. Incidental CT  findings: There is mild atherosclerotic calcification of the abdominal aorta without aneurysm. Coronary artery calcification is evident. Centrilobular and paraseptal emphysema evident. ABDOMEN/PELVIS: No abnormal hypermetabolic activity within the liver, pancreas, left adrenal gland, or spleen. No hypermetabolic lymph nodes in the abdomen or pelvis. Bilobed right adrenal lesion is stable since 02/12/2022 measuring 3.5 x 2.2 cm today compared to 3.7 x 2.0 cm on that exam. While the larger more lateral nodular component shows no FDG accumulation. The smaller more medial component is  markedly hypermetabolic with SUV max = 16.1. Patient was noted to have a 6.5 cm hypermetabolic right adrenal mass on the previous PET-CT of 04/29/2020. Incidental CT findings: Gallbladder is distended with 17 mm calcified stone in the neck of the gallbladder. Abdominal aorta measures up to 3.3 cm diameter. Small left groin hernia contains only fat. SKELETON: Small hypermetabolic focus is identified in the left T5 pedicle without a definite underlying bony abnormality on CT imaging. SUV max = 5.9. Low level FDG accumulation in the right acromioclavicular joint is probably degenerative. Incidental CT findings: Multilevel thoracolumbar vertebral augmentation noted. IMPRESSION: 1. Cavitary right parahilar lung lesion demonstrates peripheral hypermetabolism, most prominently along its lateral margin. Imaging features concerning for recurrent disease. 2. Stable bilobed right adrenal lesion demonstrates a focus of marked hypermetabolism, consistent with metastatic disease. 3. Small hypermetabolic focus in the left T5 pedicle without a definite underlying bony abnormality on CT imaging. Metastatic disease a concern. 4. Cholelithiasis. 5. 3.3 cm abdominal aortic aneurysm. Attention on follow-up recommended. 6. Aortic Atherosclerosis (ICD10-I70.0) and Emphysema (ICD10-J43.9). Electronically Signed   By: Misty Stanley M.D.   On: 03/04/2022 14:47       IMPRESSION: T5 lesion concerning for metastatic disease  ***  Today, I talked to the patient and family about the findings and work-up thus far.  We discussed the natural history of *** and general treatment, highlighting the role of radiotherapy in the management.  We discussed the available radiation techniques, and focused on the details of logistics and delivery.  We reviewed the anticipated acute and late sequelae associated with radiation in this setting.  The patient was encouraged to ask questions that I answered to the best of my ability. *** A patient consent form was discussed and signed.  We retained a copy for our records.  The patient would like to proceed with radiation and will be scheduled for CT simulation.  PLAN: ***    *** minutes of total time was spent for this patient encounter, including preparation, face-to-face counseling with the patient and coordination of care, physical exam, and documentation of the encounter.   ------------------------------------------------  Blair Promise, PhD, MD  This document serves as a record of services personally performed by [name], MD. It was created on his behalf by Roney Mans, a trained medical scribe. The creation of this record is based on the scribe's personal observations and the provider's statements to them. This document has been checked and approved by the attending provider.

## 2022-03-22 ENCOUNTER — Ambulatory Visit
Admission: RE | Admit: 2022-03-22 | Discharge: 2022-03-22 | Disposition: A | Discharge status: Home / Self Care | Payer: Managed Care, Other (non HMO) | Source: Ambulatory Visit | Attending: Radiation Oncology | Admitting: Radiation Oncology

## 2022-03-22 ENCOUNTER — Other Ambulatory Visit: Payer: Self-pay

## 2022-03-22 ENCOUNTER — Encounter: Payer: Self-pay | Admitting: Radiation Oncology

## 2022-03-22 VITALS — BP 132/84 | HR 65 | Resp 18 | Wt 249.0 lb

## 2022-03-22 DIAGNOSIS — K219 Gastro-esophageal reflux disease without esophagitis: Secondary | ICD-10-CM | POA: Diagnosis not present

## 2022-03-22 DIAGNOSIS — I4891 Unspecified atrial fibrillation: Secondary | ICD-10-CM | POA: Diagnosis not present

## 2022-03-22 DIAGNOSIS — C3491 Malignant neoplasm of unspecified part of right bronchus or lung: Secondary | ICD-10-CM

## 2022-03-22 DIAGNOSIS — Z7901 Long term (current) use of anticoagulants: Secondary | ICD-10-CM | POA: Insufficient documentation

## 2022-03-22 DIAGNOSIS — C7971 Secondary malignant neoplasm of right adrenal gland: Secondary | ICD-10-CM | POA: Diagnosis not present

## 2022-03-22 DIAGNOSIS — I480 Paroxysmal atrial fibrillation: Secondary | ICD-10-CM | POA: Diagnosis not present

## 2022-03-22 DIAGNOSIS — K8020 Calculus of gallbladder without cholecystitis without obstruction: Secondary | ICD-10-CM | POA: Insufficient documentation

## 2022-03-22 DIAGNOSIS — C3411 Malignant neoplasm of upper lobe, right bronchus or lung: Secondary | ICD-10-CM | POA: Insufficient documentation

## 2022-03-22 DIAGNOSIS — Z79899 Other long term (current) drug therapy: Secondary | ICD-10-CM | POA: Diagnosis not present

## 2022-03-22 DIAGNOSIS — C7951 Secondary malignant neoplasm of bone: Secondary | ICD-10-CM | POA: Insufficient documentation

## 2022-03-22 DIAGNOSIS — J432 Centrilobular emphysema: Secondary | ICD-10-CM | POA: Insufficient documentation

## 2022-03-22 DIAGNOSIS — I714 Abdominal aortic aneurysm, without rupture, unspecified: Secondary | ICD-10-CM | POA: Insufficient documentation

## 2022-03-22 DIAGNOSIS — Z87891 Personal history of nicotine dependence: Secondary | ICD-10-CM | POA: Insufficient documentation

## 2022-03-22 DIAGNOSIS — Z8616 Personal history of COVID-19: Secondary | ICD-10-CM | POA: Diagnosis not present

## 2022-03-22 NOTE — Progress Notes (Addendum)
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 Imaging features concerning for recurrent disease. Stable bilobed right adrenal lesion demonstrates a focus of marked hypermetabolism, consistent with metastatic disease. Small hypermetabolic focus in the left T5 pedicle without a definite underlying bony abnormality on CT imaging. Metastatic disease a concern. Cholelithiasis. 3.3 cm abdominal aortic aneurysm. Attention on follow-up recommended. Aortic Atherosclerosis (ICD10-I70.0) and Emphysema (ICD10-J43.9).  Location(s) of Symptomatic Metastases  Past/Anticipated chemotherapy by medical oncology, if any:  Under care of Dr. Burney Gauze  03/16/2022 --Current Therapy:        Pembrolizumab 200 mg IV every 3 weeks -- s/p cycle 10 -- d/c due to pulmonary toxicity Taxotere 60mg /m2 IV q 3 weeks -- s/p cycle #5 -- start on 01/29/2021 --DC on 07/20/2021 due to progression Gemzar/Navelbine -- start cycle #1 on 09/20/2021 - on hold --Impression and Plan:  John [name] is a very pleasant 62 yo gentleman with metastatic squamous cell carcinoma of the right lung.   The tumor had high PD-L1 score.   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 For right now, this is all about quality of life.   I think that the PET scan is encouraging.   Again, he does have some disease activity.  However, I do not see anything that looks new. We will see if radiation therapy might help with his T5 lesion.   I left a message for Dr. [name] of Radiation Oncology. For right now, I do need to set him up with some Xgeva.   I will try to start this when we see him back in February. Again, he  wants quality of life.  I think this is what he is most.  Important to him.  I totally agree with this.   Pain on a scale of 0-10 is:  None    If Spine Met(s), symptoms, if any, include: Bowel/Bladder retention or incontinence (please describe): None Numbness or weakness in extremities (please describe): None Current Decadron regimen, if applicable: Not currently prescribed  Ambulatory status? Walker? Wheelchair?: Ambulatory  SAFETY ISSUES: Prior radiation? Yes   Pacemaker/ICD? No Possible current pregnancy? N/A Is the patient on methotrexate? No  Current Complaints / other details:    Vitals:   03/22/22 0812  BP: 132/84  Pulse: 65  Resp: 18  SpO2: 97%  Weight: 112.9 kg

## 2022-03-31 ENCOUNTER — Telehealth: Payer: Self-pay

## 2022-03-31 DIAGNOSIS — E782 Mixed hyperlipidemia: Secondary | ICD-10-CM

## 2022-03-31 NOTE — Telephone Encounter (Signed)
-----   Message from Stephani Police, RN sent at 03/03/2022  8:23 AM EST -----  ----- Message ----- From: Sueanne Margarita, MD Sent: 03/02/2022   2:10 PM EST To: [name], MD; [address] Triage  Just sending to my staff to verify if he is currently taking and if he has missed any doses ----- Message ----- From: [name], MD Sent: 03/02/2022   1:50 PM EST To: Sueanne Margarita, MD; [address] Triage  Hey, it looks like it was prescribed back in 12/2021. Are you just wanting me to verify if he has started taking it at our next visit?  Sincerely, Soyla Murphy ----- Message ----- From: Sueanne Margarita, MD Sent: 03/02/2022   8:21 AM EST To: [name], MD; [address] Triage  Please verify if and when patient started [facility]

## 2022-03-31 NOTE — Telephone Encounter (Signed)
Spoke with patient who states that while his insurance did approve Repatha ([id number]) and he does have it on hand, he has not yet started this medication. He states he chose to hold off as he was being worked up for/receiving treatment for other medical issues over the past month. He states that issue is now resolved and he feels he can start this new medication. He was concerned to start something new before in case he had adverse effects from [place]. He states he will start repatha today.   Reviewed that his next visit with Dr. Radford Pax is 07/01/22.

## 2022-04-02 NOTE — Telephone Encounter (Signed)
Called patient to set up follow up labs per Dr. Radford Pax. Orders placed, labs scheduled. Mychart message sent per patient request.

## 2022-04-02 NOTE — Addendum Note (Signed)
Addended by: Joni Reining on: 04/02/2022 06:01 PM   Modules accepted: Orders

## 2022-04-09 ENCOUNTER — Encounter: Payer: Self-pay | Admitting: Primary Care

## 2022-04-09 ENCOUNTER — Other Ambulatory Visit: Payer: Self-pay

## 2022-04-09 ENCOUNTER — Ambulatory Visit: Payer: Managed Care, Other (non HMO) | Admitting: Primary Care

## 2022-04-09 ENCOUNTER — Ambulatory Visit (INDEPENDENT_AMBULATORY_CARE_PROVIDER_SITE_OTHER): Payer: Managed Care, Other (non HMO)

## 2022-04-09 VITALS — BP 118/82 | HR 88 | Temp 98.4°F | Ht 72.0 in | Wt 244.2 lb

## 2022-04-09 DIAGNOSIS — R6889 Other general symptoms and signs: Secondary | ICD-10-CM

## 2022-04-09 DIAGNOSIS — J9611 Chronic respiratory failure with hypoxia: Secondary | ICD-10-CM | POA: Diagnosis not present

## 2022-04-09 DIAGNOSIS — I4819 Other persistent atrial fibrillation: Secondary | ICD-10-CM | POA: Diagnosis not present

## 2022-04-09 DIAGNOSIS — J4489 Other specified chronic obstructive pulmonary disease: Secondary | ICD-10-CM

## 2022-04-09 LAB — CBC WITH DIFFERENTIAL/PLATELET
Basophils Absolute: 0 10*3/uL (ref 0.0–0.1)
Basophils Relative: 0.5 % (ref 0.0–3.0)
Eosinophils Absolute: 0.2 10*3/uL (ref 0.0–0.7)
Eosinophils Relative: 2.6 % (ref 0.0–5.0)
HCT: 39.8 % (ref 39.0–52.0)
Hemoglobin: 13.4 g/dL (ref 13.0–17.0)
Lymphocytes Relative: 9.9 % — ABNORMAL LOW (ref 12.0–46.0)
Lymphs Abs: 0.8 10*3/uL (ref 0.7–4.0)
MCHC: 33.7 g/dL (ref 30.0–36.0)
MCV: 89.9 fl (ref 78.0–100.0)
Monocytes Absolute: 0.9 10*3/uL (ref 0.1–1.0)
Monocytes Relative: 11.9 % (ref 3.0–12.0)
Neutro Abs: 5.9 10*3/uL (ref 1.4–7.7)
Neutrophils Relative %: 75.1 % (ref 43.0–77.0)
Platelets: 298 10*3/uL (ref 150.0–400.0)
RBC: 4.43 Mil/uL (ref 4.22–5.81)
RDW: 14.9 % (ref 11.5–15.5)
WBC: 7.9 10*3/uL (ref 4.0–10.5)

## 2022-04-09 LAB — MAGNESIUM: Magnesium: 2 mg/dL (ref 1.5–2.5)

## 2022-04-09 LAB — BASIC METABOLIC PANEL WITH GFR
BUN: 13 mg/dL (ref 6–23)
CO2: 26 meq/L (ref 19–32)
Calcium: 9.3 mg/dL (ref 8.4–10.5)
Chloride: 96 meq/L (ref 96–112)
Creatinine, Ser: 1.32 mg/dL (ref 0.40–1.50)
GFR: 58.56 mL/min — ABNORMAL LOW
Glucose, Bld: 144 mg/dL — ABNORMAL HIGH (ref 70–99)
Potassium: 3.9 meq/L (ref 3.5–5.1)
Sodium: 134 meq/L — ABNORMAL LOW (ref 135–145)

## 2022-04-09 MED ORDER — METHYLPREDNISOLONE ACETATE 80 MG/ML IJ SUSP
80.0000 mg | Freq: Once | INTRAMUSCULAR | Status: AC
  Administered 2022-04-09: 80 mg via INTRAMUSCULAR

## 2022-04-09 NOTE — Assessment & Plan Note (Addendum)
-   Received portable oxygen contractor since last visit. Patient can be resistant to oxygen use at times, continue to encourage consistent oxygen use especially with activity to maintain O2 >88-90%

## 2022-04-09 NOTE — Progress Notes (Signed)
$'@Patient'u$  ID: John [name], male    DOB: 1960-04-03, 62 y.o.   MRN: [id number]  Chief Complaint  Patient presents with   Follow-up    Referring provider: [name], MD  HPI: 62 year old male, former smoker quit in 2018 (30-pack-year history).  Past medical history significant for A. 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 Patient of Dr. [name].   Previous [facility] pulmonary encounter:  ROV 06/17/21 --62 year old gentleman who follows with today after complicated course post admission for acute right pneumothorax.  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  His right-sided chest tube is now out.  Today he reports that he is feeling better. He is now on chemotherapy with Dr [name]. He is using Anoro qd, can get SOB with exertion. He uses O2 with heavier exertion 2-3L/min. He has tanks, would prefer a POC and has tried to get one, but have not achieved this with DME yet. Minimal to no albuterol use. Planning for repeat CT CAP in a few weeks.   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  He had been managed initially on Keytruda but this was complicated by pneumonitis.  He had tried Anoro but did not get any clinical benefit so this has been discontinued.  He was changed to [facility] in June but never started taking it, also received a prednisone taper at that time.  He has had kyphoplasty, needs another one but unclear when this will be done. He is planning to talk to Dr [name] about the options for further therapy - he is unsure whether he is going to do this. He just finished a course of doxycycline. He remains on prednisone '5mg'$    Most recent  chest imaging CT 08/20/21 shows right perihilar fibrotic and atelectatic change stable compared with prior, diffuse groundglass attenuation, again unchanged.  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 He is not wearing oxygen today.  He has oxygen concentrator and tanks through [employer] He is interested in getting portable concentrator / O2 86% RA today, needs 2L POC  Denies cough or purulent drainage  CT abdomen pelvic in December 2023 showed increased RUL cavitation, differential includes post radiation changes versus enlargement due to lung cancer. He is scheduled for PET scan January 18th    04/09/2022- Interim hx  Patient presents today for acute OV. 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   He started to feel unwell earlier this week. His appetite has been decreased, he lost 6 lbs. He has symptoms of increased heart rate and diaphoresis. No acute URI symptoms. He has a chronic cough, no change in mucus production or purulence. CXR today without acute process. He has not been using Breztri inhaler regularly, he is hesitant to use inhalers d.t history of afib. He takes '5mg'$  prednisone daily. His O2 has been around 88-90%  RA with activity. He has been better about wearing his oxygen. He has POC with him today.    Allergies  Allergen Reactions   Albuterol Other (See Comments)    Patient has had episode of atrial fib following administration of albuterol (tolerates Xopenex well)   Atorvastatin Other (See Comments)    Causes arthritis pain    Immunization History   Administered Date(s) Administered   PFIZER Comirnaty([place])Covid-19 Tri-Sucrose Vaccine 05/10/2019, 06/04/2019, 12/31/2019    Past Medical History:  Diagnosis Date   Arthritis    Rheumatoid   Bacteremia due to Pseudomonas    C. difficile colitis    Chronic anxiety 11/08/2014   Cough 09/22/2021   COVID 2020   mild case   Depression    Drug-induced neutropenia ([place]) 08/11/2020   Family history of adverse reaction to anesthesia    brother with seizures had episode under anesthesia.  Patient has seizures as well.   GERD (gastroesophageal reflux disease)    History of radiation therapy 04/24/2020-05/16/2020   IMRT to right lung     Dr [name]   History of radiation therapy    08/10/21-08/19/21-Dr. [name]   Neutropenia [facility]) 05/12/2020   Neutropenic fever ([place]) 05/12/2020   Normocytic anemia 04/18/2021   PAF (paroxysmal atrial fibrillation) (HCC)    CHADS2VSAC score 3   Rheumatoid aortitis    Secondary hypercoagulable state ([place]) 10/28/2020   Sepsis ([place]) 10/06/2020   Shortness of breath 12/18/2020   Squamous cell lung cancer ([place])     Tobacco History: Social History   Tobacco Use  Smoking Status Former   Packs/day: 1.00   Years: 30.00   Total pack years: 30.00   Types: Cigarettes   Quit date: 2018   Years since quitting: 6.1  Smokeless Tobacco Never  Tobacco Comments   Former smoker, quit 2018   Counseling given: Not Answered Tobacco comments: Former smoker, quit 2018   Outpatient Medications Prior to Visit  Medication Sig Dispense Refill   amiodarone (PACERONE) 200 MG tablet Take 1 tablet (200 mg total) by mouth daily. 90 tablet 3   Budeson-Glycopyrrol-Formoterol (BREZTRI AEROSPHERE) 160-9-4.8 MCG/ACT AERO Inhale 2 puffs into the lungs in the morning and at bedtime. 10.7 g 5   ELIQUIS 5 MG TABS tablet Take 5 mg by mouth 2 (two) times daily.     Evolocumab (REPATHA SURECLICK) XX123456 MG/ML SOAJ Inject 140 mg into the skin every 14 (fourteen) days. 6 mL 3    metoprolol succinate (TOPROL-XL) 25 MG 24 hr tablet Take 25 mg by mouth daily.     omeprazole (PRILOSEC) 20 MG capsule Take 1 capsule (20 mg total) by mouth daily. 30 capsule 2   PARoxetine (PAXIL) 10 MG tablet Take 1 tablet (10 mg total) by mouth at bedtime. 90 tablet 2   predniSONE (DELTASONE) 5 MG tablet Take 5 mg by mouth in the morning.     No facility-administered medications prior to visit.   Review of Systems  Review of Systems  Constitutional:  Positive for appetite change, fatigue and unexpected weight change. Negative for fever.  HENT: Negative.  Negative for congestion and postnasal drip.   Respiratory:  Positive for cough and wheezing. Negative for chest tightness and shortness of breath.   Cardiovascular: Negative.  Negative for chest pain, palpitations and leg swelling.   Physical Exam  BP 118/82 (BP Location: Left Arm, Patient Position: Sitting, Cuff Size: Normal)   Pulse 88   Temp 98.4 F (36.9 C) (Oral)   Ht  6' (1.829 m)   Wt 244 lb 3.2 oz (110.8 kg)   SpO2 94%   BMI 33.12 kg/m  Physical Exam Constitutional:      Appearance: Normal appearance. He is not ill-appearing.  HENT:     Head: Normocephalic and atraumatic.     Mouth/Throat:     Mouth: Mucous membranes are moist.     Pharynx: Oropharynx is clear.  Cardiovascular:     Rate and Rhythm: Normal rate and regular rhythm.     Comments: Regular rate and rhythm to auscultation  No edema Pulmonary:     Effort: Pulmonary effort is normal.     Breath sounds: Wheezing present.     Comments: Audible wheezing t/o lung fields. Has POC but it is turned off.  Musculoskeletal:        General: Normal range of motion.  Skin:    General: Skin is warm and dry.  Neurological:     General: No focal deficit present.     Mental Status: He is alert and oriented to person, place, and time. Mental status is at baseline.  Psychiatric:        Mood and Affect: Mood normal.        Behavior: Behavior normal.         Thought Content: Thought content normal.        Judgment: Judgment normal.      Lab Results:  CBC    Component Value Date/Time   WBC 6.8 03/16/2022 1159   WBC 8.3 10/12/2021 0626   RBC 4.39 03/16/2022 1159   HGB 13.4 03/16/2022 1159   HCT 40.6 03/16/2022 1159   PLT 207 03/16/2022 1159   MCV 92.5 03/16/2022 1159   MCH 30.5 03/16/2022 1159   MCHC 33.0 03/16/2022 1159   RDW 14.2 03/16/2022 1159   LYMPHSABS 1.0 03/16/2022 1159   MONOABS 0.9 03/16/2022 1159   EOSABS 0.3 03/16/2022 1159   BASOSABS 0.0 03/16/2022 1159    BMET    Component Value Date/Time   NA 142 03/16/2022 1159   NA 141 03/01/2022 0749   K 4.3 03/16/2022 1159   CL 104 03/16/2022 1159   CO2 29 03/16/2022 1159   GLUCOSE 93 03/16/2022 1159   BUN 10 03/16/2022 1159   BUN 11 03/01/2022 0749   CREATININE 1.29 (H) 03/16/2022 1159   CALCIUM 9.3 03/16/2022 1159   GFRNONAA >60 03/16/2022 1159    BNP    Component Value Date/Time   BNP 34.6 08/20/2021 1139    ProBNP No results found for: "PROBNP"  Imaging: DG Chest 2 View  Result Date: 04/09/2022 CLINICAL DATA:  Non-small-cell lung carcinoma.  COPD. EXAM: CHEST - 2 VIEW COMPARISON:  08/20/2021 FINDINGS: Heart size remains normal. Stable post treatment changes seen in the right hemithorax. Left lung is clear. No new or increased areas of pulmonary opacity are seen. No evidence pleural effusion. IMPRESSION: Stable post treatment changes in right hemithorax. No acute findings. Electronically Signed   By: Marlaine Hind M.D.   On: 04/09/2022 13:35     Assessment & Plan:   COPD with asthma ([place]) - Treating for acute exacerbation of COPD/asthma. He had audible wheezing t/o lung fields on exam. He has not been using [employer] regularly. Received depo-medrol '80mg'$  IM x1 in office. CXR without acute opacity. Advised he resume Breztri Aerosphere two puffs q12 hours and start prednisone '40mg'$  x 2 days, '20mg'$  x 2 days, '10mg'$  x 2 days. No indication for antibiotic  therapy right now,  advised he notify office if he notices increased mucus production or any purulence to sputum.   Chronic respiratory failure with hypoxia ([place]) - Received portable oxygen contractor since last visit. Patient can be resistant to oxygen use at times, continue to encourage consistent oxygen use especially with activity to maintain O2 >88-90%   Persistent atrial fibrillation (HCC) - Hx afib, felt to be induced by aggressive albuterol therapy during prior COPD exacerbation. He was in regular rhythm today on exam, HR 88. Continue amiodarone '200mg'$  daily, Toprol-XL '50mg'$  daily and Eliquis '5mg'$  twice daily   Martyn Ehrich, NP 04/09/2022

## 2022-04-09 NOTE — Patient Instructions (Addendum)
CXR was negative for pneumonia or acute processes  Heart is regular on exam. You do not appear to be in afib today  We will check labs today   Recommendations: Resume Breztri Aerosphere - take 2 puffs every 12 hours Take '40mg'$  prednisone x 2 days; '20mg'$  prednisone x 2 days; '10mg'$  x 2 days; then resume '5mg'$    Orders: Depo-medrol '80mg'$  IM x1  Follow-up: Please schedule follow up with Dr. [name] in 2-3 months (April/May)

## 2022-04-09 NOTE — Assessment & Plan Note (Addendum)
-   Hx afib, felt to be induced by aggressive albuterol therapy during prior COPD exacerbation. He was in regular rhythm today on exam, HR 88. Continue amiodarone 200mg  daily, Toprol-XL 50mg  daily and Eliquis 5mg  twice daily

## 2022-04-09 NOTE — Assessment & Plan Note (Addendum)
-   Treating for acute exacerbation of COPD/asthma. He had audible wheezing t/o lung fields on exam. He has not been using [employer] regularly. Received depo-medrol 80mg  IM x1 in office. CXR without acute opacity. Advised he resume Breztri Aerosphere two puffs q12 hours and start prednisone 40mg  x 2 days, 20mg  x 2 days, 10mg  x 2 days. No indication for antibiotic therapy right now, advised he notify office if he notices increased mucus production or any purulence to sputum.

## 2022-04-13 ENCOUNTER — Other Ambulatory Visit: Payer: Managed Care, Other (non HMO)

## 2022-04-13 DIAGNOSIS — Z515 Encounter for palliative care: Secondary | ICD-10-CM

## 2022-04-13 NOTE — Progress Notes (Signed)
COMMUNITY PALLIATIVE CARE SW NOTE  PATIENT NAME: John [name] DOB: 1960/11/09 MRN: [id number]  PRIMARY CARE PROVIDER: [name], MD  RESPONSIBLE PARTY:  Acct ID - Guarantor Home Phone Work Phone Relationship Acct Type  [id number] [facility]* [phone]  Self P/F     [address], [place], [place] [address]   Palliative Care Clinical Social Work Encounter  PC SW completed a telephonic encounter with patient's wife to assess his needs, comfort and status. Patient did not respond to SW phone call. His wife advised that patient continues to have shortness of breath with any physical activity. Patient has an o2 concentrator which patient uses as needed as 3L. He also has [employer] machine for when he leaves the home. His appetite is good and he continues to eat 2/3 meals a day. He continues to manage his pain with Tylenol and hydrocodone for more severe pain, which patient experienced last week. Patient continues to ambulate independently and he works from home. He continues to complete his own personal care, but has to go slow as he is easily SOB. Patient did have a scan and another spot was found on his spine. No treatments have been determined or offered at this time. SW offered an in-person visit, however his wife request that PC follow-up in 4-6 weeks.     Social History   Tobacco Use   Smoking status: Former    Packs/day: 1.00    Years: 30.00    Total pack years: 30.00    Types: Cigarettes    Quit date: 2018    Years since quitting: 6.1   Smokeless tobacco: Never   Tobacco comments:    Former smoker, quit 2018  Substance Use Topics   Alcohol use: Not Currently    CODE STATUS: Full Code ADVANCED DIRECTIVES: No MOST FORM COMPLETE:  No HOSPICE EDUCATION PROVIDED: No  Duration of encounter and documentation: 30 minutes  [employer], LCSW

## 2022-04-15 ENCOUNTER — Inpatient Hospital Stay: Payer: Managed Care, Other (non HMO)

## 2022-04-15 ENCOUNTER — Inpatient Hospital Stay: Payer: Managed Care, Other (non HMO) | Admitting: Hematology & Oncology

## 2022-04-16 ENCOUNTER — Other Ambulatory Visit: Payer: Self-pay | Admitting: Hematology & Oncology

## 2022-04-23 ENCOUNTER — Telehealth: Payer: Self-pay

## 2022-04-23 MED ORDER — HYDROCODONE BIT-HOMATROP MBR 5-1.5 MG/5ML PO SOLN: 5.0000 mL | Freq: Four times a day (QID) | ORAL | 0 refills | Status: DC | PRN

## 2022-04-23 NOTE — Telephone Encounter (Signed)
Sent RX, patient has been notified

## 2022-04-23 NOTE — Telephone Encounter (Signed)
John [name]  P [facility] (supporting Martyn Ehrich, NP)18 minutes ago (3:05 PM)    I have had a cough that is much better now and I've been taking this hydrocodone cough medicine that I'm out of. If there's anyway possible, I can get that refilled that would be great. I only take it once a day now but it has definitely helped me. Please let me know.

## 2022-04-29 ENCOUNTER — Inpatient Hospital Stay (HOSPITAL_BASED_OUTPATIENT_CLINIC_OR_DEPARTMENT_OTHER): Payer: Managed Care, Other (non HMO) | Admitting: Hematology & Oncology

## 2022-04-29 ENCOUNTER — Encounter: Payer: Self-pay | Admitting: Hematology & Oncology

## 2022-04-29 ENCOUNTER — Inpatient Hospital Stay: Payer: Managed Care, Other (non HMO)

## 2022-04-29 ENCOUNTER — Inpatient Hospital Stay: Payer: Managed Care, Other (non HMO) | Attending: Internal Medicine

## 2022-04-29 VITALS — BP 106/72 | HR 100 | Temp 98.6°F | Resp 18 | Wt 247.0 lb

## 2022-04-29 DIAGNOSIS — M899 Disorder of bone, unspecified: Secondary | ICD-10-CM | POA: Diagnosis present

## 2022-04-29 DIAGNOSIS — C3411 Malignant neoplasm of upper lobe, right bronchus or lung: Secondary | ICD-10-CM | POA: Diagnosis not present

## 2022-04-29 DIAGNOSIS — C779 Secondary and unspecified malignant neoplasm of lymph node, unspecified: Secondary | ICD-10-CM | POA: Diagnosis not present

## 2022-04-29 DIAGNOSIS — C3491 Malignant neoplasm of unspecified part of right bronchus or lung: Secondary | ICD-10-CM

## 2022-04-29 DIAGNOSIS — C7970 Secondary malignant neoplasm of unspecified adrenal gland: Secondary | ICD-10-CM | POA: Insufficient documentation

## 2022-04-29 DIAGNOSIS — D702 Other drug-induced agranulocytosis: Secondary | ICD-10-CM

## 2022-04-29 LAB — CBC WITH DIFFERENTIAL (CANCER CENTER ONLY)
Abs Immature Granulocytes: 0.08 10*3/uL — ABNORMAL HIGH (ref 0.00–0.07)
Basophils Absolute: 0.1 10*3/uL (ref 0.0–0.1)
Basophils Relative: 1 %
Eosinophils Absolute: 0.3 10*3/uL (ref 0.0–0.5)
Eosinophils Relative: 2 %
HCT: 42.9 % (ref 39.0–52.0)
Hemoglobin: 14 g/dL (ref 13.0–17.0)
Immature Granulocytes: 1 %
Lymphocytes Relative: 10 %
Lymphs Abs: 1.2 10*3/uL (ref 0.7–4.0)
MCH: 30 pg (ref 26.0–34.0)
MCHC: 32.6 g/dL (ref 30.0–36.0)
MCV: 92.1 fL (ref 80.0–100.0)
Monocytes Absolute: 1.3 10*3/uL — ABNORMAL HIGH (ref 0.1–1.0)
Monocytes Relative: 10 %
Neutro Abs: 9.3 10*3/uL — ABNORMAL HIGH (ref 1.7–7.7)
Neutrophils Relative %: 76 %
Platelet Count: 163 10*3/uL (ref 150–400)
RBC: 4.66 MIL/uL (ref 4.22–5.81)
RDW: 14.5 % (ref 11.5–15.5)
WBC Count: 12.2 10*3/uL — ABNORMAL HIGH (ref 4.0–10.5)
nRBC: 0 % (ref 0.0–0.2)

## 2022-04-29 LAB — CMP (CANCER CENTER ONLY)
ALT: 17 U/L (ref 0–44)
AST: 13 U/L — ABNORMAL LOW (ref 15–41)
Albumin: 3.8 g/dL (ref 3.5–5.0)
Alkaline Phosphatase: 82 U/L (ref 38–126)
Anion gap: 9 (ref 5–15)
BUN: 17 mg/dL (ref 6–20)
CO2: 28 mmol/L (ref 22–32)
Calcium: 8.9 mg/dL (ref 8.9–10.3)
Chloride: 103 mmol/L (ref 98–111)
Creatinine: 1.33 mg/dL — ABNORMAL HIGH (ref 0.61–1.24)
GFR, Estimated: >60 mL/min (ref >60)
Glucose, Bld: 123 mg/dL — ABNORMAL HIGH (ref 70–99)
Potassium: 4.7 mmol/L (ref 3.5–5.1)
Sodium: 140 mmol/L (ref 135–145)
Total Bilirubin: 0.7 mg/dL (ref 0.3–1.2)
Total Protein: 6.7 g/dL (ref 6.5–8.1)

## 2022-04-29 LAB — LACTATE DEHYDROGENASE: LDH: 182 U/L (ref 98–192)

## 2022-04-29 MED ORDER — DENOSUMAB 120 MG/1.7ML ~~LOC~~ SOLN
120.0000 mg | Freq: Once | SUBCUTANEOUS | Status: AC
  Administered 2022-04-29: 120 mg via SUBCUTANEOUS
  Filled 2022-04-29: qty 1.7

## 2022-04-29 NOTE — Progress Notes (Signed)
John [name] + Hematology and Oncology Follow Up Visit  John [name] [id number] 24-Sep-1960 62 y.o. 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 3 months --next dose in 07/2022   Interim History:  John [name] is here today for follow-up.  He does look quite good.  He does have a little bit of a cough.  He actually had a reschedule appointment because of a cough.  He had a chest x-ray  that was done couple weeks ago.  Chest x-ray did not show any obvious pneumonia.   The cough has caused some pain over on his right flank.  I think this probably is just some muscle strain.  He probably is can be due for a PET scan within the next 3-4 weeks.  We probably will have to set up a scan at that time.  We had sent him to Dr. [name] of Radiation Oncology for a lesion at T5.  It was decided after in-depth consultation that we would hold off on any treatment.  He has had no change in bowel or bladder habits.  He continues on Eliquis for the atrial fibrillation.  The amiodarone, I think, is helping the heart extent and rhythm.  He has had little bit of leg swelling.  There has been no rashes.  He has had no bleeding.  Overall, I would have to say that his performance status is probably ECOG 1.   Medications:  Allergies as of 04/29/2022       Reactions   Albuterol Other (See Comments)   Patient has had episode of atrial fib following administration of albuterol (tolerates Xopenex well)   Atorvastatin Other (See Comments)   Causes arthritis pain        Medication List        Accurate as of April 29, 2022 12:57 PM.  If you have any questions, ask your nurse or doctor.          amiodarone 200 MG tablet Commonly known as: Pacerone Take 1 tablet (200 mg total) by mouth daily.   Breztri Aerosphere 160-9-4.8 MCG/ACT Aero Generic drug: Budeson-Glycopyrrol-Formoterol Inhale 2 puffs into the lungs in the morning and at bedtime.   Eliquis 5 MG Tabs tablet Generic drug: apixaban Take 5 mg by mouth 2 (two) times daily.   HYDROcodone bit-homatropine 5-1.5 MG/5ML syrup Commonly known as: HYCODAN Take 5 mLs by mouth every 6 (six) hours as needed for cough.   metoprolol succinate 25 MG 24 hr tablet Commonly known as: TOPROL-XL Take 25 mg by mouth daily.   omeprazole 20 MG capsule Commonly known as: PRILOSEC Take 1 capsule (20 mg total) by mouth daily.   PARoxetine 10 MG tablet Commonly known as: PAXIL Take 1 tablet (10 mg total) by mouth at bedtime.   predniSONE 5 MG tablet Commonly known as: DELTASONE TAKE 1 TABLET(5 MG) BY MOUTH DAILY   Repatha SureClick [id number] MG/ML Soaj Generic drug: Evolocumab Inject 140 mg into the skin every 14 (fourteen) days.        Allergies:  Allergies  Allergen Reactions  Albuterol Other (See Comments)    Patient has had episode of atrial fib following administration of albuterol (tolerates Xopenex well)   Atorvastatin Other (See Comments)    Causes arthritis pain    Past Medical History, Surgical history, Social history, and Family History were reviewed and updated.  Review of Systems: Review of Systems  Constitutional:  Positive for malaise/fatigue.  HENT: Negative.    Eyes: Negative.   Respiratory:  Positive for cough and shortness of breath.   Cardiovascular:  Positive for chest pain.  Gastrointestinal: Negative.   Genitourinary: Negative.   Musculoskeletal:  Positive for joint pain.  Skin: Negative.   Neurological:  Positive for focal weakness.  Endo/Heme/Allergies: Negative.   Psychiatric/Behavioral: Negative.       Physical Exam:   weight is 247 lb (112 kg). His oral temperature is 98.6 F (37 C). His blood pressure is 106/72 and his pulse is 100. His respiration is 18 and oxygen saturation is 94%.   Wt Readings from Last 3 Encounters:  04/29/22 247 lb (112 kg)  04/09/22 244 lb 3.2 oz (110.8 kg)  03/22/22 249 lb (112.9 kg)    Physical Exam Vitals reviewed.  HENT:     Head: Normocephalic and atraumatic.  Eyes:     Pupils: Pupils are equal, round, and reactive to light.  Cardiovascular:     Rate and Rhythm: Normal rate and regular rhythm.     Heart sounds: Normal heart sounds.  Pulmonary:     Effort: Pulmonary effort is normal.     Breath sounds: Normal breath sounds.  Abdominal:     General: Bowel sounds are normal.     Palpations: Abdomen is soft.  Musculoskeletal:        General: No tenderness or deformity. Normal range of motion.     Cervical back: Normal range of motion.  Lymphadenopathy:     Cervical: No cervical adenopathy.  Skin:    General: Skin is warm and dry.     Findings: No erythema or rash.  Neurological:     Mental Status: He is alert and oriented to person, place, and time.  Psychiatric:        Behavior: Behavior normal.        Thought Content: Thought content normal.        Judgment: Judgment normal.     Lab Results  Component Value Date   WBC 12.2 (H) 04/29/2022   HGB 14.0 04/29/2022   HCT 42.9 04/29/2022   MCV 92.1 04/29/2022   PLT 163 04/29/2022   Lab Results  Component Value Date   FERRITIN 446 (H) 08/23/2021   IRON 189 (H) 08/26/2021   TIBC 213 (L) 08/26/2021   UIBC 24 08/26/2021   IRONPCTSAT 89 (H) 08/26/2021   Lab Results  Component Value Date   RETICCTPCT 1.9 08/23/2021   RBC 4.66 04/29/2022   No results found for: "KPAFRELGTCHN", "LAMBDASER", "KAPLAMBRATIO" No results found for: "IGGSERUM", "IGA", "IGMSERUM" No results found for: "TOTALPROTELP", "ALBUMINELP", "A1GS", "A2GS", "BETS", "BETA2SER", "GAMS", "MSPIKE", "SPEI"   Chemistry      Component Value  Date/Time   NA 140 04/29/2022 1146   NA 141 03/01/2022 0749   K 4.7 04/29/2022 1146   CL 103 04/29/2022 1146   CO2 28 04/29/2022 1146   BUN 17 04/29/2022 1146   BUN 11 03/01/2022 0749   CREATININE 1.33 (H) 04/29/2022 1146      Component Value Date/Time   CALCIUM 8.9 04/29/2022 1146   ALKPHOS 82 04/29/2022 1146  AST 13 (L) 04/29/2022 1146   ALT 17 04/29/2022 1146   BILITOT 0.7 04/29/2022 1146       Impression and Plan: Mr. Acree is a very pleasant 62 yo gentleman with metastatic squamous cell carcinoma of the right lung.  The tumor had high PD-L1 score.  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  For right now, this is all about quality of life.  Hopefully, we will see that the PET scan will not show that he is progressing too quickly.  Our treatment options so you are not that great.  Again, quality of life is what he wants to focus on.  We will see about the PET scan in about 3-4 weeks.  I will then see him back afterwards and then we can see how everything looks.    He will get his Delton See today.  I do think this is going to be helpful for the bony lesions.    Volanda Napoleon, MD 3/14/202412:57 PM

## 2022-04-29 NOTE — Patient Instructions (Signed)
Denosumab Injection (Oncology) What is this medication? DENOSUMAB (den oh SUE mab) prevents weakened bones caused by cancer. It may also be used to treat noncancerous bone tumors that cannot be removed by surgery. It can also be used to treat high calcium levels in the blood caused by cancer. It works by blocking a protein that causes bones to break down quickly. This slows down the release of calcium from bones, which lowers calcium levels in your blood. It also makes your bones stronger and less likely to break (fracture). This medicine may be used for other purposes; ask your health care provider or pharmacist if you have questions. COMMON BRAND NAME(S): XGEVA What should I tell my care team before I take this medication? They need to know if you have any of these conditions: Dental disease Having surgery or tooth extraction Infection Kidney disease Low levels of calcium or vitamin D in the blood Malnutrition On hemodialysis Skin conditions or sensitivity Thyroid or parathyroid disease An unusual reaction to denosumab, other medications, foods, dyes, or preservatives Pregnant or trying to get pregnant Breast-feeding How should I use this medication? This medication is for injection under the skin. It is given by your care team in a hospital or clinic setting. A special MedGuide will be given to you before each treatment. Be sure to read this information carefully each time. Talk to your care team about the use of this medication in children. While it may be prescribed for children as young as 13 years for selected conditions, precautions do apply. Overdosage: If you think you have taken too much of this medicine contact a poison control center or emergency room at once. NOTE: This medicine is only for you. Do not share this medicine with others. What if I miss a dose? Keep appointments for follow-up doses. It is important not to miss your dose. Call your care team if you are unable to  keep an appointment. What may interact with this medication? Do not take this medication with any of the following: Other medications containing denosumab This medication may also interact with the following: Medications that lower your chance of fighting infection Steroid medications, such as prednisone or cortisone This list may not describe all possible interactions. Give your health care provider a list of all the medicines, herbs, non-prescription drugs, or dietary supplements you use. Also tell them if you smoke, drink alcohol, or use illegal drugs. Some items may interact with your medicine. What should I watch for while using this medication? Your condition will be monitored carefully while you are receiving this medication. You may need blood work while taking this medication. This medication may increase your risk of getting an infection. Call your care team for advice if you get a fever, chills, sore throat, or other symptoms of a cold or flu. Do not treat yourself. Try to avoid being around people who are sick. You should make sure you get enough calcium and vitamin D while you are taking this medication, unless your care team tells you not to. Discuss the foods you eat and the vitamins you take with your care team. Some people who take this medication have severe bone, joint, or muscle pain. This medication may also increase your risk for jaw problems or a broken thigh bone. Tell your care team right away if you have severe pain in your jaw, bones, joints, or muscles. Tell your care team if you have any pain that does not go away or that gets worse. Talk   to your care team if you may be pregnant. Serious birth defects can occur if you take this medication during pregnancy and for 5 months after the last dose. You will need a negative pregnancy test before starting this medication. Contraception is recommended while taking this medication and for 5 months after the last dose. Your care team  can help you find the option that works for you. What side effects may I notice from receiving this medication? Side effects that you should report to your care team as soon as possible: Allergic reactions--skin rash, itching, hives, swelling of the face, lips, tongue, or throat Bone, joint, or muscle pain Low calcium level--muscle pain or cramps, confusion, tingling, or numbness in the hands or feet Osteonecrosis of the jaw--pain, swelling, or redness in the mouth, numbness of the jaw, poor healing after dental work, unusual discharge from the mouth, visible bones in the mouth Side effects that usually do not require medical attention (report to your care team if they continue or are bothersome): Cough Diarrhea Fatigue Headache Nausea This list may not describe all possible side effects. Call your doctor for medical advice about side effects. You may report side effects to FDA at 1-800-FDA-1088. Where should I keep my medication? This medication is given in a hospital or clinic. It will not be stored at home. NOTE: This sheet is a summary. It may not cover all possible information. If you have questions about this medicine, talk to your doctor, pharmacist, or health care provider.  2023 Elsevier/Gold Standard (2021-06-22 00:00:00)  

## 2022-05-14 MED ORDER — HYDROCODONE BIT-HOMATROP MBR 5-1.5 MG/5ML PO SOLN: 5.0000 mL | Freq: Four times a day (QID) | ORAL | 0 refills | Status: DC | PRN

## 2022-05-14 NOTE — Telephone Encounter (Signed)
I refilled it for him

## 2022-05-14 NOTE — Telephone Encounter (Signed)
Dr. [name] pt is requesting Hycodan refill. Please advise.

## 2022-05-19 ENCOUNTER — Telehealth: Payer: Self-pay | Admitting: *Deleted

## 2022-05-19 NOTE — Telephone Encounter (Signed)
Notified that a P2P could be done on the PET scan that was denied.  Called number given and was told that we needed to fax all pertinent information at this time.  Ref # [id number].  Office notes faxed to [phone].  Phone number is [phone] .

## 2022-05-21 ENCOUNTER — Telehealth: Payer: Self-pay | Admitting: Cardiology

## 2022-05-21 ENCOUNTER — Ambulatory Visit: Payer: Managed Care, Other (non HMO)

## 2022-05-21 DIAGNOSIS — I4819 Other persistent atrial fibrillation: Secondary | ICD-10-CM

## 2022-05-21 MED ORDER — ELIQUIS 5 MG PO TABS: 5.0000 mg | ORAL_TABLET | Freq: Two times a day (BID) | ORAL | 5 refills | Status: DC

## 2022-05-21 NOTE — Telephone Encounter (Signed)
Prescription refill request for Eliquis received. Indication: Afib  Last office visit: 12/30/21 Mayford Knife)  Scr: 1.33 (04/29/22)  Age: 62 Weight: 112kg  Appropriate dose. Refill sent.

## 2022-05-21 NOTE — Telephone Encounter (Signed)
*  STAT* If patient is at the pharmacy, call can be transferred to refill team.   1. Which medications need to be refilled? (please list name of each medication and dose if known)   ELIQUIS 5 MG TABS tablet    2. Which pharmacy/location (including street and city if local pharmacy) is medication to be sent to? [id number] - [place], [place] - [address] AT [facility] OF [address]   3. Do they need a 30 day or 90 day supply? 60 day   Pt is out of medication

## 2022-05-25 ENCOUNTER — Ambulatory Visit: Payer: Managed Care, Other (non HMO) | Attending: Cardiology

## 2022-05-25 ENCOUNTER — Telehealth: Payer: Self-pay

## 2022-05-25 DIAGNOSIS — E785 Hyperlipidemia, unspecified: Secondary | ICD-10-CM

## 2022-05-25 DIAGNOSIS — E782 Mixed hyperlipidemia: Secondary | ICD-10-CM

## 2022-05-25 NOTE — Telephone Encounter (Signed)
1115 am.  Phone call made to patient to schedule a follow up home visit.  No answer. Message left requesting a call back.

## 2022-05-26 LAB — LIPID PANEL
Chol/HDL Ratio: 5.4 ratio — ABNORMAL HIGH (ref 0.0–5.0)
Cholesterol, Total: 156 mg/dL (ref 100–199)
HDL: 29 mg/dL — ABNORMAL LOW (ref >39)
LDL Chol Calc (NIH): 103 mg/dL — ABNORMAL HIGH (ref 0–99)
Triglycerides: 133 mg/dL (ref 0–149)
VLDL Cholesterol Cal: 24 mg/dL (ref 5–40)

## 2022-05-26 LAB — ALT: ALT: 19 IU/L (ref 0–44)

## 2022-05-27 ENCOUNTER — Telehealth: Payer: Self-pay

## 2022-05-27 DIAGNOSIS — E782 Mixed hyperlipidemia: Secondary | ICD-10-CM

## 2022-05-27 DIAGNOSIS — Z79899 Other long term (current) drug therapy: Secondary | ICD-10-CM

## 2022-05-27 NOTE — Telephone Encounter (Signed)
Spoke with patient regarding his recent labs. He does state that he was 4 days late taking his most recent repatha injection because he forgot about it. He also states he has been taking antibiotics and prednisone for an upper respiratory illness for the past week. Forwarded patient's responses to Dr. Mayford Knife and Pharm D.

## 2022-05-27 NOTE — Telephone Encounter (Signed)
Prednisone and abx should not make his Repatha less effective.

## 2022-05-27 NOTE — Telephone Encounter (Signed)
-----   Message from Quintella Reichert, MD sent at 05/26/2022 10:07 AM EDT ----- Please verify patient has been taking his Repatha

## 2022-05-28 ENCOUNTER — Other Ambulatory Visit: Payer: Self-pay

## 2022-05-28 DIAGNOSIS — R053 Chronic cough: Secondary | ICD-10-CM

## 2022-05-31 ENCOUNTER — Ambulatory Visit: Payer: Managed Care, Other (non HMO) | Admitting: Primary Care

## 2022-05-31 ENCOUNTER — Ambulatory Visit (INDEPENDENT_AMBULATORY_CARE_PROVIDER_SITE_OTHER): Payer: Managed Care, Other (non HMO)

## 2022-05-31 ENCOUNTER — Telehealth: Payer: Self-pay

## 2022-05-31 ENCOUNTER — Encounter: Payer: Self-pay | Admitting: Primary Care

## 2022-05-31 VITALS — BP 104/62 | HR 79 | Ht 72.0 in | Wt 250.0 lb

## 2022-05-31 DIAGNOSIS — R053 Chronic cough: Secondary | ICD-10-CM | POA: Diagnosis not present

## 2022-05-31 DIAGNOSIS — J4489 Other specified chronic obstructive pulmonary disease: Secondary | ICD-10-CM

## 2022-05-31 DIAGNOSIS — C3401 Malignant neoplasm of right main bronchus: Secondary | ICD-10-CM | POA: Diagnosis not present

## 2022-05-31 DIAGNOSIS — J189 Pneumonia, unspecified organism: Secondary | ICD-10-CM | POA: Insufficient documentation

## 2022-05-31 DIAGNOSIS — J984 Other disorders of lung: Secondary | ICD-10-CM

## 2022-05-31 DIAGNOSIS — J9611 Chronic respiratory failure with hypoxia: Secondary | ICD-10-CM | POA: Diagnosis not present

## 2022-05-31 MED ORDER — HYDROCODONE BIT-HOMATROP MBR 5-1.5 MG/5ML PO SOLN: 5.0000 mL | Freq: Four times a day (QID) | ORAL | 0 refills | Status: DC | PRN

## 2022-05-31 MED ORDER — AMOXICILLIN-POT CLAVULANATE 875-125 MG PO TABS: 1.0000 | ORAL_TABLET | Freq: Two times a day (BID) | ORAL | 0 refills | Status: DC

## 2022-05-31 NOTE — Patient Instructions (Signed)
Recommendations: Stop [employer]- take 1 puffs daily (if you develop tachycardia notify office) Take Augmentin as directed x 7 days Continue to taper prednisone Continue Hycodan cough syrup as needed   Follow-up First opening in May with Dr. Delton Coombes

## 2022-05-31 NOTE — Assessment & Plan Note (Signed)
-   Secondary to [facility], on chronic oxygen now

## 2022-05-31 NOTE — Progress Notes (Signed)
@Patient  ID: John [name], male    DOB: 08-13-1960, 62 y.o.   MRN: [id number]  Chief Complaint  Patient presents with   Acute Visit    Referring provider: Merrilyn Puma, MD  HPI: 62 year old male, former smoker quit in 2018 (30-pack-year history).  Past medical history significant for A. 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 Patient of Dr. Delton Coombes.   Previous [facility] pulmonary encounter:  ROV 06/17/21 --62 year old gentleman who follows with today after complicated course post admission for acute right pneumothorax.  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  His right-sided chest tube is now out.  Today he reports that he is feeling better. He is now on chemotherapy with Dr Monika Salk. He is using Anoro qd, can get SOB with exertion. He uses O2 with heavier exertion 2-3L/min. He has tanks, would prefer a POC and has tried to get one, but have not achieved this with DME yet. Minimal to no albuterol use. Planning for repeat CT CAP in a few weeks.   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  He had been managed initially on Keytruda but this was complicated by pneumonitis.  He had tried Anoro but did not get any clinical benefit so this has been discontinued.  He was changed to [facility] in June but never started taking it, also received a prednisone taper at that time.  He has had kyphoplasty, needs another one but unclear when this will be done. He is planning to talk to Dr Monika Salk about the options for further therapy - he is unsure whether he is going to do this. He just finished a course of doxycycline. He remains on prednisone 5mg    Most recent  chest imaging CT 08/20/21 shows right perihilar fibrotic and atelectatic change stable compared with prior, diffuse groundglass attenuation, again unchanged.  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 He is not wearing oxygen today.  He has oxygen concentrator and tanks through [employer] He is interested in getting portable concentrator / O2 86% RA today, needs 2L POC  Denies cough or purulent drainage  CT abdomen pelvic in December 2023 showed increased RUL cavitation, differential includes post radiation changes versus enlargement due to lung cancer. He is scheduled for PET scan January 18th    04/09/2022 Patient presents today for acute OV. 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   He started to feel unwell earlier this week. His appetite has been decreased, he lost 6 lbs. He has symptoms of increased heart rate and diaphoresis. No acute URI symptoms. He has a chronic cough, no change in mucus production or purulence. CXR today without acute process. He has not been using Breztri inhaler regularly, he is hesitant to use inhalers d.t history of afib. He takes 5mg  prednisone daily. His O2 has been around 88-90% RA with  activity. He has been better about wearing his oxygen. He has POC with him today.     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 He has had a cough for several months. He recently started getting up some colored sputum, we send in course of  Doxycycline. He is feeling some better since finishing 7 day course of doxycycline on Saturday, April 13th. Sputum color has started to clear up. No hemoptysis. Coughing fits occur 3-4 times a day. They can at times make him dizzy. The only thing that helps suppress his cough is hycodan cough syrup. He is scheduled for PET scan on 4/22. He stopped taking Breztri Aerosphere because it made him heart rate go up. He has been more compliant with oxygen.   Allergies  Allergen Reactions   Albuterol Other (See Comments)    Patient has had episode of atrial fib following administration of albuterol (tolerates Xopenex well)   Atorvastatin Other (See Comments)    Causes arthritis pain    Immunization History  Administered Date(s) Administered   PFIZER Comirnaty(Gray Top)Covid-19 Tri-Sucrose Vaccine 05/10/2019, 06/04/2019, 12/31/2019    Past Medical History:  Diagnosis Date   Arthritis    Rheumatoid   Bacteremia due to Pseudomonas    C. difficile colitis    Chronic anxiety 11/08/2014   Cough 09/22/2021   COVID 2020   mild case   Depression    Drug-induced neutropenia 08/11/2020   Family history of adverse reaction to anesthesia    brother with seizures had episode under anesthesia.  Patient has seizures as well.   GERD (gastroesophageal reflux disease)    History of radiation therapy 04/24/2020-05/16/2020   IMRT to right lung     Dr Antony Blackbird   History of radiation therapy    08/10/21-08/19/21-Dr. Antony Blackbird   Neutropenia 05/12/2020   Neutropenic fever 05/12/2020   Normocytic anemia 04/18/2021   PAF (paroxysmal atrial fibrillation)    CHADS2VSAC score 3   Rheumatoid aortitis    Secondary hypercoagulable state 10/28/2020   Sepsis 10/06/2020   Shortness of breath 12/18/2020   Squamous cell lung cancer     Tobacco History: Social History   Tobacco Use  Smoking Status Former   Packs/day: 1.00   Years: 30.00   Additional pack years: 0.00   Total pack years: 30.00   Types:  Cigarettes   Quit date: 2018   Years since quitting: 6.2  Smokeless Tobacco Never  Tobacco Comments   Former smoker, quit 2018   Counseling given: Not Answered Tobacco comments: Former smoker, quit 2018   Outpatient Medications Prior to Visit  Medication Sig Dispense Refill   amiodarone (PACERONE) 200 MG tablet Take 1 tablet (200 mg total) by mouth daily. 90 tablet 3   ELIQUIS 5 MG TABS tablet Take 1 tablet (5 mg total) by mouth 2 (two) times daily. 60 tablet 5   Evolocumab (REPATHA SURECLICK) 140 MG/ML SOAJ Inject 140 mg into the skin every 14 (fourteen) days. 6 mL 3   metoprolol succinate (TOPROL-XL) 25 MG 24 hr tablet Take 25 mg by mouth daily.     omeprazole (PRILOSEC) 20 MG capsule Take 1 capsule (20 mg total) by mouth daily. 30 capsule 2   PARoxetine (PAXIL) 10 MG tablet Take 1 tablet (10 mg total) by mouth at bedtime. 90 tablet 2   predniSONE (DELTASONE) 5 MG tablet TAKE 1 TABLET(5 MG) BY MOUTH DAILY 90  tablet 4   Budeson-Glycopyrrol-Formoterol (BREZTRI AEROSPHERE) 160-9-4.8 MCG/ACT AERO Inhale 2 puffs into the lungs in the morning and at bedtime. (Patient not taking: Reported on 05/31/2022) 10.7 g 5   HYDROcodone bit-homatropine (HYCODAN) 5-1.5 MG/5ML syrup Take 5 mLs by mouth every 6 (six) hours as needed for cough. (Patient not taking: Reported on 05/31/2022) 240 mL 0   No facility-administered medications prior to visit.   Review of Systems  Review of Systems  Constitutional: Negative.  Negative for unexpected weight change.  HENT: Negative.    Respiratory:  Positive for cough. Negative for chest tightness and wheezing.     Physical Exam  BP 104/62 (BP Location: Left Arm, Patient Position: Sitting, Cuff Size: Large)   Pulse 79   Ht 6' (1.829 m)   Wt 250 lb (113.4 kg)   SpO2 95%   BMI 33.91 kg/m  Physical Exam Constitutional:      Appearance: Normal appearance.  HENT:     Head: Normocephalic and atraumatic.     Mouth/Throat:     Mouth: Mucous membranes are  moist.     Pharynx: Oropharynx is clear.  Cardiovascular:     Rate and Rhythm: Normal rate and regular rhythm.  Pulmonary:     Effort: Pulmonary effort is normal.     Breath sounds: Normal breath sounds. No wheezing, rhonchi or rales.  Skin:    General: Skin is warm and dry.  Neurological:     General: No focal deficit present.     Mental Status: He is alert and oriented to person, place, and time. Mental status is at baseline.  Psychiatric:        Mood and Affect: Mood normal.        Behavior: Behavior normal.        Thought Content: Thought content normal.        Judgment: Judgment normal.      Lab Results:  CBC    Component Value Date/Time   WBC 12.2 (H) 04/29/2022 1146   WBC 7.9 04/09/2022 1409   RBC 4.66 04/29/2022 1146   HGB 14.0 04/29/2022 1146   HCT 42.9 04/29/2022 1146   PLT 163 04/29/2022 1146   MCV 92.1 04/29/2022 1146   MCH 30.0 04/29/2022 1146   MCHC 32.6 04/29/2022 1146   RDW 14.5 04/29/2022 1146   LYMPHSABS 1.2 04/29/2022 1146   MONOABS 1.3 (H) 04/29/2022 1146   EOSABS 0.3 04/29/2022 1146   BASOSABS 0.1 04/29/2022 1146    BMET    Component Value Date/Time   NA 140 04/29/2022 1146   NA 141 03/01/2022 0749   K 4.7 04/29/2022 1146   CL 103 04/29/2022 1146   CO2 28 04/29/2022 1146   GLUCOSE 123 (H) 04/29/2022 1146   BUN 17 04/29/2022 1146   BUN 11 03/01/2022 0749   CREATININE 1.33 (H) 04/29/2022 1146   CALCIUM 8.9 04/29/2022 1146   GFRNONAA >60 04/29/2022 1146    BNP    Component Value Date/Time   BNP 34.6 08/20/2021 1139    ProBNP No results found for: "PROBNP"  Imaging: DG Chest 2 View  Result Date: 05/31/2022 CLINICAL DATA:  Shortness of breath and cough EXAM: CHEST - 2 VIEW COMPARISON:  CXR 04/09/22 FINDINGS: No pleural effusion. No pneumothorax. Redemonstrated partly cavitary airspace opacity in the right midlung, which now demonstrates an air-fluid level. No new focal airspace opacities. No radiographically apparent displaced rib  fractures unchanged cardiac and mediastinal contours. Vertebral body heights are maintained. IMPRESSION: Redemonstrated partly cavitary  airspace opacity in the right midlung, which now demonstrates an air-fluid level. Recommend further evaluation with chest CT. Electronically Signed   By: Lorenza Cambridge M.D.   On: 05/31/2022 13:12     Assessment & Plan:   Right middle lobe pneumonia - Patient has chronic cough which recently became purulent. Completed 7 day course of Doxycycline on 4/13, cough symptoms have improved some. CXR today showed partly cavitary airspace opacity in right midlung with an air-fluid level. Reviewed with Dr. Delton Coombes. RX Augmentin 1 tab twice daily x 7 days. Refilling Hycodan 5ml q6 hours which has been the only thing that has helped his cough. Scheduled for PET CT on 4/22.  COPD with asthma (HCC) - Patient has mild obstructive lung disease with positive bronchodilator response based on pulmonary function testing in 2022. He has been having a lot more issues with his breathing over the last 3-4 months. He was last treated for copd/asthma exacerbation in February. He has no active wheezing on exam today but has in the past. He reported improvement in his breathing with Breztri but stopped using due to tachycardia concerns. I think it is best we optimize his regimen, we will try patient on Trelegy one puff daily. He is on  prednisone daily. Pulmonary function testing is scheduled for June.   Chronic respiratory failure with hypoxia (HCC) - Patient has been more compliant with oxygen use. He has a portable oxygen concentrator. Encourage patient continue to wear 2L supplemental oxygen especially with activity to maintain O2 >88-90%.   Malignant neoplasm of lung (HCC) - Following with Dr. Myna Hidalgo, he was last seen 04/29/22. He is currently receiving Xgeva- not a lot more treatment options. Focus is on quality of life. He is scheduled for PET CT next week.   Drug-induced  pneumonitis - Secondary to [facility], on chronic oxygen now    Glenford Bayley, NP 05/31/2022

## 2022-05-31 NOTE — Assessment & Plan Note (Addendum)
-   Following with Dr. Myna Hidalgo, he was last seen 04/29/22. He is currently receiving Xgeva- not a lot more treatment options. Focus is on quality of life. He is scheduled for PET CT next week.

## 2022-05-31 NOTE — Telephone Encounter (Signed)
415 pm.  Phone call made to wife Karena Addison to follow up on Palliative Care services and schedule a home visit.  No answer. Message left requesting a call back.

## 2022-05-31 NOTE — Telephone Encounter (Signed)
Spoke with patient and advised that prednisone and antibiotics do not decrease effectiveness of repatha. Shared Dr. Mayford Knife advice to repeat labs in 8 weeks. Patient verbalizes understanding and agrees to plan. Orders placed, labs scheduled.

## 2022-05-31 NOTE — Assessment & Plan Note (Signed)
-   Patient has been more compliant with oxygen use. He has a portable oxygen concentrator. Encourage patient continue to wear 2L supplemental oxygen especially with activity to maintain O2 >88-90%.

## 2022-05-31 NOTE — Addendum Note (Signed)
Addended by: Luellen Pucker on: 05/31/2022 10:21 AM   Modules accepted: Orders

## 2022-05-31 NOTE — Assessment & Plan Note (Addendum)
-   Patient has chronic cough which recently became purulent. Completed 7 day course of Doxycycline on 4/13, cough symptoms have improved some. CXR today showed partly cavitary airspace opacity in right midlung with an air-fluid level. Reviewed with Dr. Delton Coombes. RX Augmentin 1 tab twice daily x 7 days. Refilling Hycodan 48ml q6 hours which has been the only thing that has helped his cough. Scheduled for PET CT on 4/22.

## 2022-05-31 NOTE — Assessment & Plan Note (Addendum)
-   Patient has mild obstructive lung disease with positive bronchodilator response based on pulmonary function testing in 2022. He has been having a lot more issues with his breathing over the last 3-4 months. He was last treated for copd/asthma exacerbation in February. He has no active wheezing on exam today but has in the past. He reported improvement in his breathing with Breztri but stopped using due to tachycardia concerns. I think it is best we optimize his regimen, we will try patient on Trelegy one puff daily. He is on 5mg  prednisone daily. Pulmonary function testing is scheduled for June.

## 2022-06-02 ENCOUNTER — Encounter: Payer: Self-pay | Admitting: Internal Medicine

## 2022-06-02 ENCOUNTER — Other Ambulatory Visit (HOSPITAL_COMMUNITY): Payer: Self-pay

## 2022-06-03 ENCOUNTER — Telehealth: Payer: Self-pay

## 2022-06-03 NOTE — Telephone Encounter (Signed)
Received voicemail from patient asking about his PET scan denial. Pt has PET scan scheduled for 06/07/2022. This RN called his insurance and opened a new case and reviewed information with the nurse. The case is being forwarded to MD for review and will let us know the determination.  This RN called patient and left VM on cell phone of update on progress of approval for PET scan.

## 2022-06-07 ENCOUNTER — Encounter (HOSPITAL_COMMUNITY): Payer: Self-pay

## 2022-06-07 ENCOUNTER — Ambulatory Visit (HOSPITAL_COMMUNITY): Payer: Managed Care, Other (non HMO)

## 2022-06-10 ENCOUNTER — Encounter: Payer: Self-pay | Admitting: Hematology & Oncology

## 2022-06-10 ENCOUNTER — Inpatient Hospital Stay: Payer: Managed Care, Other (non HMO) | Admitting: Hematology & Oncology

## 2022-06-10 ENCOUNTER — Inpatient Hospital Stay: Payer: Managed Care, Other (non HMO) | Attending: Internal Medicine

## 2022-06-10 VITALS — BP 134/80 | HR 82 | Temp 98.3°F | Resp 18 | Ht 72.0 in | Wt 251.8 lb

## 2022-06-10 DIAGNOSIS — Z7901 Long term (current) use of anticoagulants: Secondary | ICD-10-CM | POA: Insufficient documentation

## 2022-06-10 DIAGNOSIS — I4891 Unspecified atrial fibrillation: Secondary | ICD-10-CM | POA: Insufficient documentation

## 2022-06-10 DIAGNOSIS — C7970 Secondary malignant neoplasm of unspecified adrenal gland: Secondary | ICD-10-CM | POA: Insufficient documentation

## 2022-06-10 DIAGNOSIS — C3491 Malignant neoplasm of unspecified part of right bronchus or lung: Secondary | ICD-10-CM

## 2022-06-10 DIAGNOSIS — C779 Secondary and unspecified malignant neoplasm of lymph node, unspecified: Secondary | ICD-10-CM | POA: Insufficient documentation

## 2022-06-10 DIAGNOSIS — C3411 Malignant neoplasm of upper lobe, right bronchus or lung: Secondary | ICD-10-CM | POA: Diagnosis present

## 2022-06-10 LAB — CMP (CANCER CENTER ONLY)
ALT: 18 U/L (ref 0–44)
AST: 22 U/L (ref 15–41)
Albumin: 3.9 g/dL (ref 3.5–5.0)
Alkaline Phosphatase: 84 U/L (ref 38–126)
Anion gap: 12 (ref 5–15)
BUN: 9 mg/dL (ref 6–20)
CO2: 26 mmol/L (ref 22–32)
Calcium: 8.6 mg/dL — ABNORMAL LOW (ref 8.9–10.3)
Chloride: 101 mmol/L (ref 98–111)
Creatinine: 1.15 mg/dL (ref 0.61–1.24)
GFR, Estimated: >60 mL/min (ref >60)
Glucose, Bld: 110 mg/dL — ABNORMAL HIGH (ref 70–99)
Potassium: 4.8 mmol/L (ref 3.5–5.1)
Sodium: 139 mmol/L (ref 135–145)
Total Bilirubin: 0.5 mg/dL (ref 0.3–1.2)
Total Protein: 6.7 g/dL (ref 6.5–8.1)

## 2022-06-10 LAB — CBC WITH DIFFERENTIAL (CANCER CENTER ONLY)
Abs Immature Granulocytes: 0.04 10*3/uL (ref 0.00–0.07)
Basophils Absolute: 0.1 10*3/uL (ref 0.0–0.1)
Basophils Relative: 1 %
Eosinophils Absolute: 0.2 10*3/uL (ref 0.0–0.5)
Eosinophils Relative: 2 %
HCT: 40.4 % (ref 39.0–52.0)
Hemoglobin: 13.3 g/dL (ref 13.0–17.0)
Immature Granulocytes: 0 %
Lymphocytes Relative: 10 %
Lymphs Abs: 1 10*3/uL (ref 0.7–4.0)
MCH: 30.5 pg (ref 26.0–34.0)
MCHC: 32.9 g/dL (ref 30.0–36.0)
MCV: 92.7 fL (ref 80.0–100.0)
Monocytes Absolute: 0.8 10*3/uL (ref 0.1–1.0)
Monocytes Relative: 9 %
Neutro Abs: 7.4 10*3/uL (ref 1.7–7.7)
Neutrophils Relative %: 78 %
Platelet Count: 239 10*3/uL (ref 150–400)
RBC: 4.36 MIL/uL (ref 4.22–5.81)
RDW: 15.4 % (ref 11.5–15.5)
WBC Count: 9.4 10*3/uL (ref 4.0–10.5)
nRBC: 0 % (ref 0.0–0.2)

## 2022-06-10 LAB — LACTATE DEHYDROGENASE: LDH: 194 U/L — ABNORMAL HIGH (ref 98–192)

## 2022-06-10 NOTE — Progress Notes (Signed)
John [name] + Hematology and Oncology Follow Up Visit  John [name] [id number] Feb 11, 1961 61 y.o. 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 3 months --next dose in 07/2022   Interim History:  John [name] is here today for follow-up.  He comes in with his supplemental oxygen.  Unfortunate, for some reason, the insurance company would not approve of the PET scan.  I am not sure as to why.  But clearly, PET scans are the best way to evaluate his malignancy.  He certainly has not lost weight.  His wife thinks that he does need to lose little bit of weight.  He is eating quite a lot.  Some of this has to do with him being on steroids.  He has not complained of any kind of pain.  Unfortunate, he was charged $7000 for the Xgeva.  He had up a $2000.  He really does not wish to have this in the future.  I totally understand this.  I am surprised that he is being charged this amount.  There is no problems with bowels or bladder.  He has had no issues with atrial fibrillation.  I think he is on amiodarone.  He also is on Eliquis.  He has had no problems with obvious bleeding.  There is been no rashes.  He had no leg swelling.  There is been no headache.  Overall, I would say that his performance status is probably ECOG 1.    Medications:  Allergies as of 06/10/2022       Reactions   Albuterol Other (See Comments)   Patient has had episode of atrial fib following administration of albuterol (tolerates Xopenex well)   Atorvastatin Other (See Comments)   Causes arthritis pain        Medication List         Accurate as of June 10, 2022  1:13 PM. If you have any questions, ask your nurse or doctor.          amiodarone 200 MG tablet Commonly known as: Pacerone Take 1 tablet (200 mg total) by mouth daily.   amoxicillin-clavulanate 875-125 MG tablet Commonly known as: AUGMENTIN Take 1 tablet by mouth 2 (two) times daily.   Eliquis 5 MG Tabs tablet Generic drug: apixaban Take 1 tablet (5 mg total) by mouth 2 (two) times daily.   HYDROcodone bit-homatropine 5-1.5 MG/5ML syrup Commonly known as: HYCODAN Take 5 mLs by mouth every 6 (six) hours as needed for cough.   metoprolol succinate 25 MG 24 hr tablet Commonly known as: TOPROL-XL Take 25 mg by mouth daily.   omeprazole 20 MG capsule Commonly known as: PRILOSEC Take 1 capsule (20 mg total) by mouth daily.   PARoxetine 10 MG tablet Commonly known as: PAXIL Take 1 tablet (10 mg total) by mouth at bedtime.   predniSONE 5 MG tablet Commonly known as: DELTASONE TAKE 1 TABLET(5 MG) BY MOUTH DAILY   Repatha SureClick 140 MG/ML Soaj Generic drug: Evolocumab Inject 140 mg into the skin every 14 (fourteen) days.  Allergies:  Allergies  Allergen Reactions   Albuterol Other (See Comments)    Patient has had episode of atrial fib following administration of albuterol (tolerates Xopenex well)   Atorvastatin Other (See Comments)    Causes arthritis pain    Past Medical History, Surgical history, Social history, and Family History were reviewed and updated.  Review of Systems: Review of Systems  Constitutional:  Positive for malaise/fatigue.  HENT: Negative.    Eyes: Negative.   Respiratory:  Positive for cough and shortness of breath.   Cardiovascular:  Positive for chest pain.  Gastrointestinal: Negative.   Genitourinary: Negative.   Musculoskeletal:  Positive for joint pain.  Skin: Negative.   Neurological:  Positive for focal weakness.  Endo/Heme/Allergies: Negative.   Psychiatric/Behavioral: Negative.        Physical Exam:  height is 6' (1.829 m) and weight is 251 lb 12 oz (114.2 kg). His oral temperature is 98.3 F (36.8 C). His blood pressure is 134/80 and his pulse is 82. His respiration is 18 and oxygen saturation is 96%.   Wt Readings from Last 3 Encounters:  06/10/22 251 lb 12 oz (114.2 kg)  05/31/22 250 lb (113.4 kg)  04/29/22 247 lb (112 kg)    Physical Exam Vitals reviewed.  HENT:     Head: Normocephalic and atraumatic.  Eyes:     Pupils: Pupils are equal, round, and reactive to light.  Cardiovascular:     Rate and Rhythm: Normal rate and regular rhythm.     Heart sounds: Normal heart sounds.  Pulmonary:     Effort: Pulmonary effort is normal.     Breath sounds: Normal breath sounds.  Abdominal:     General: Bowel sounds are normal.     Palpations: Abdomen is soft.  Musculoskeletal:        General: No tenderness or deformity. Normal range of motion.     Cervical back: Normal range of motion.  Lymphadenopathy:     Cervical: No cervical adenopathy.  Skin:    General: Skin is warm and dry.     Findings: No erythema or rash.  Neurological:     Mental Status: He is alert and oriented to person, place, and time.  Psychiatric:        Behavior: Behavior normal.        Thought Content: Thought content normal.        Judgment: Judgment normal.    Lab Results  Component Value Date   WBC 9.4 06/10/2022   HGB 13.3 06/10/2022   HCT 40.4 06/10/2022   MCV 92.7 06/10/2022   PLT 239 06/10/2022   Lab Results  Component Value Date   FERRITIN 446 (H) 08/23/2021   IRON 189 (H) 08/26/2021   TIBC 213 (L) 08/26/2021   UIBC 24 08/26/2021   IRONPCTSAT 89 (H) 08/26/2021   Lab Results  Component Value Date   RETICCTPCT 1.9 08/23/2021   RBC 4.36 06/10/2022   No results found for: "KPAFRELGTCHN", "LAMBDASER", "KAPLAMBRATIO" No results found for: "IGGSERUM", "IGA", "IGMSERUM" No results found for: "TOTALPROTELP", "ALBUMINELP", "A1GS", "A2GS", "BETS", "BETA2SER", "GAMS",  "MSPIKE", "SPEI"   Chemistry      Component Value Date/Time   NA 139 06/10/2022 1236   NA 141 03/01/2022 0749   K 4.8 06/10/2022 1236   CL 101 06/10/2022 1236   CO2 26 06/10/2022 1236   BUN 9 06/10/2022 1236   BUN 11 03/01/2022 0749   CREATININE 1.15 06/10/2022 1236      Component Value Date/Time  CALCIUM 8.6 (L) 06/10/2022 1236   ALKPHOS 84 06/10/2022 1236   AST 22 06/10/2022 1236   ALT 18 06/10/2022 1236   BILITOT 0.5 06/10/2022 1236       Impression and Plan: Mr. Baranowski is a very pleasant 62 yo gentleman with metastatic squamous cell carcinoma of the right lung.  The tumor had high PD-L1 score.  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  We really need to get this PET scan.  Hopefully, the insurance company will allow for this.  I am not sure why he was denied a PET scan.  He would be willing to take more treatment if he would have a longer life.  As such, this I think is why PET scan will help [place] out.  We will see about doing a PET scan in a couple of weeks or so.  I am just glad that he seems to be managing pretty well.  At the supplemental oxygen is helping him.  I will see him back after he has his PET scan done.     Josph Macho, MD 4/25/20241:13 PM

## 2022-06-14 ENCOUNTER — Ambulatory Visit (INDEPENDENT_AMBULATORY_CARE_PROVIDER_SITE_OTHER): Payer: Managed Care, Other (non HMO) | Admitting: Student

## 2022-06-14 VITALS — BP 116/71 | HR 94 | Temp 97.5°F | Ht 72.0 in | Wt 255.8 lb

## 2022-06-14 DIAGNOSIS — F419 Anxiety disorder, unspecified: Secondary | ICD-10-CM

## 2022-06-14 DIAGNOSIS — C3491 Malignant neoplasm of unspecified part of right bronchus or lung: Secondary | ICD-10-CM | POA: Diagnosis not present

## 2022-06-14 DIAGNOSIS — I4819 Other persistent atrial fibrillation: Secondary | ICD-10-CM | POA: Diagnosis not present

## 2022-06-14 NOTE — Assessment & Plan Note (Signed)
In regular rhythm on exam today and rate-controlled. Continue amiodarone 200mg  daily, toprol-xl 25mg  daily, and eliquis 5mg  BID.

## 2022-06-14 NOTE — Assessment & Plan Note (Signed)
With history of Stage IV SCC of RUL with lymph node and adrenal mets. He follows Dr. Myna Hidalgo for treatment. His current treatment was held since last June with plan for periodic imaging to assess stability. However, patient has been unable to obtain PET scan due to insurance denying coverage. Has tried twice and denied both times. He is very frustrated today given this. I will reach out to Dr. Myna Hidalgo to see what can be done about this given need to decide if further immunotherapy is warranted at this time.

## 2022-06-14 NOTE — Progress Notes (Signed)
   CC: f/u persistent Afib, metastatic SCC of R lung  HPI:  [name] is a 62 y.o. male with history listed below presenting to the [facility] for f/u persistent Afib and metastatic SCC of R lung. Please see individualized problem based charting for full HPI.  Past Medical History:  Diagnosis Date   Arthritis    Rheumatoid   Bacteremia due to Pseudomonas    C. difficile colitis    Chronic anxiety 11/08/2014   Cough 09/22/2021   COVID 2020   mild case   Depression    Drug-induced neutropenia (HCC) 08/11/2020   Family history of adverse reaction to anesthesia    brother with seizures had episode under anesthesia.  Patient has seizures as well.   GERD (gastroesophageal reflux disease)    History of radiation therapy 04/24/2020-05/16/2020   IMRT to right lung     Dr Antony Blackbird   History of radiation therapy    08/10/21-08/19/21-Dr. Antony Blackbird   Neutropenia [facility]) 05/12/2020   Neutropenic fever (HCC) 05/12/2020   Normocytic anemia 04/18/2021   PAF (paroxysmal atrial fibrillation) (HCC)    CHADS2VSAC score 3   Rheumatoid aortitis    Secondary hypercoagulable state (HCC) 10/28/2020   Sepsis (HCC) 10/06/2020   Shortness of breath 12/18/2020   Squamous cell lung cancer (HCC)     Review of Systems:  Negative aside from that listed in individualized problem based charting.  Physical Exam:  Vitals:   06/14/22 1350  BP: 116/71  Pulse: 94  Temp: (!) 97.5 F (36.4 C)  TempSrc: Oral  Weight: 255 lb 12.8 oz (116 kg)  Height: 6' (1.829 m)   Physical Exam Constitutional:      Appearance: Normal appearance. He is obese. He is not ill-appearing.  HENT:     Mouth/Throat:     Mouth: Mucous membranes are moist.     Pharynx: Oropharynx is clear. No oropharyngeal exudate.  Eyes:     General: No scleral icterus.    Extraocular Movements: Extraocular movements intact.     Conjunctiva/sclera: Conjunctivae normal.     Pupils: Pupils are equal, round, and reactive to light.   Cardiovascular:     Rate and Rhythm: Normal rate and regular rhythm.     Heart sounds: Normal heart sounds. No murmur heard.    No gallop.  Pulmonary:     Effort: Pulmonary effort is normal.     Breath sounds: Normal breath sounds. No wheezing, rhonchi or rales.     Comments: On 2L [place], chronically Abdominal:     General: Bowel sounds are normal. There is no distension.     Palpations: Abdomen is soft.     Tenderness: There is no abdominal tenderness. There is no guarding or rebound.  Musculoskeletal:        General: No swelling. Normal range of motion.  Skin:    General: Skin is warm and dry.  Neurological:     General: No focal deficit present.     Mental Status: He is alert and oriented to person, place, and time.  Psychiatric:        Mood and Affect: Mood normal.        Behavior: Behavior normal.      Assessment & Plan:   See Encounters Tab for problem based charting.  Patient discussed with Dr.  Lafonda Mosses

## 2022-06-14 NOTE — Assessment & Plan Note (Signed)
Chronic and stable on paxil 10mg  daily.

## 2022-06-14 NOTE — Patient Instructions (Addendum)
Mr. Mckillop,  It was a pleasure seeing you in the clinic today.   Please make sure to reach out to Dr. Gustavo Lah office like we discussed. I will send him a direct message today to inform him as well. You have your appointments with Dr. Mayford Knife and Dr. Delton Coombes on 07/01/2022. Please call our clinic if you need any medication refills. Please come back to see [place] in 6 months (or sooner if needed).  Please call our clinic at [phone] if you have any questions or concerns. The best time to call is Monday-Friday from 9am-4pm, but there is someone available 24/7 at the same number. If you need medication refills, please notify your pharmacy one week in advance and they will send [place] a request.   Thank you for letting us take part in your care. We look forward to seeing you next time!

## 2022-06-17 NOTE — Progress Notes (Signed)
Internal Medicine Clinic Attending  Case discussed with Dr. Jinwala  At the time of the visit.  We reviewed the resident's history and exam and pertinent patient test results.  I agree with the assessment, diagnosis, and plan of care documented in the resident's note.  

## 2022-07-01 ENCOUNTER — Ambulatory Visit (INDEPENDENT_AMBULATORY_CARE_PROVIDER_SITE_OTHER): Payer: Managed Care, Other (non HMO) | Admitting: Emergency Medicine

## 2022-07-01 ENCOUNTER — Encounter: Payer: Self-pay | Admitting: Emergency Medicine

## 2022-07-01 ENCOUNTER — Encounter: Payer: Self-pay | Admitting: *Deleted

## 2022-07-01 ENCOUNTER — Ambulatory Visit: Payer: Managed Care, Other (non HMO) | Admitting: Cardiology

## 2022-07-01 VITALS — BP 130/74 | HR 68 | Temp 97.7°F | Ht 69.0 in | Wt 256.2 lb

## 2022-07-01 DIAGNOSIS — K219 Gastro-esophageal reflux disease without esophagitis: Secondary | ICD-10-CM | POA: Diagnosis not present

## 2022-07-01 DIAGNOSIS — J9611 Chronic respiratory failure with hypoxia: Secondary | ICD-10-CM | POA: Diagnosis not present

## 2022-07-01 DIAGNOSIS — C3491 Malignant neoplasm of unspecified part of right bronchus or lung: Secondary | ICD-10-CM

## 2022-07-01 DIAGNOSIS — J4489 Other specified chronic obstructive pulmonary disease: Secondary | ICD-10-CM | POA: Diagnosis not present

## 2022-07-01 MED ORDER — TRELEGY ELLIPTA 100-62.5-25 MCG/ACT IN AEPB: 1.0000 | INHALATION_SPRAY | Freq: Every day | RESPIRATORY_TRACT | 5 refills | Status: DC

## 2022-07-01 NOTE — Progress Notes (Signed)
Subjective:    Patient ID: John [name], male    DOB: October 27, 1960, 62 y.o.   MRN: [id number]  HPI  ROV 09/22/2021 --62 year old man with a history of stage IV squamous cell lung cancer of the right upper lobe, chronic right effusion and a prolonged hospitalization for an acute right pneumothorax.  He had been managed initially on Keytruda but this was complicated by pneumonitis.  He had tried Anoro but did not get any clinical benefit so this has been discontinued.  He was changed to [facility] in June but never started taking it, also received a prednisone taper at that time.  He has had kyphoplasty, needs another one but unclear when this will be done. He is planning to talk to Dr Monika Salk about the options for further therapy - he is unsure whether he is going to do this. He just finished a course of doxycycline. He remains on prednisone 5mg    Most recent chest imaging CT 08/20/21 shows right perihilar fibrotic and atelectatic change stable compared with prior, diffuse groundglass attenuation, again unchanged.  No pneumothorax no effusion  ROV 07/01/22 --61 year old man with stage IV squamous cell lung cancer involving the right upper lobe with chronic right effusion, prolonged hospitalization for an acute right pneumothorax.  His course was complicated by pneumonitis due to [facility].  There is interstitial change and a cavitary lesion in the right midlung.  Has COPD and some associated restrictive lung disease due to the above.  Has been dealing with cough, sputum consistent with pneumonia, treated in mid April.  His Breztri he has been changed to Trelegy, was having tachycardia.  He is on chronic prednisone at 5mg .  He is due for surveillance PET scan on 5/17. He is not on his omeprazole right now.  Today he reports that his cough is a bit better but still happening. He has some R upper chest and muscle pain. He wants script for Trelegy, has not needed any albuterol. Uses 3L/min w exertion.    Review of  Systems As per HPI      Objective:   Physical Exam Vitals:   07/01/22 0826  BP: 130/74  Pulse: 68  Temp: 97.7 F (36.5 C)  TempSrc: Oral  SpO2: 90%  Weight: 256 lb 3.2 oz (116.2 kg)  Height: 5\' 9"  (1.753 m)   Gen: Pleasant, well-nourished, in no distress, normal affect.  O2 in place.  ENT: No lesions,  mouth clear,  oropharynx clear, no postnasal drip  Neck: No JVD, no stridor  Lungs: No use of accessory muscles, mostly clear.  No wheezes or crackles  Cardiovascular: RRR, heart sounds normal, no murmur or gallops, no peripheral edema  Musculoskeletal: No deformities  Neuro: alert, awake, non focal  Skin: Warm, no lesions or rash        Assessment & Plan:  COPD with asthma (HCC) Doing better since he was treated for pneumonia in early April.  Still has some dry cough.  He has benefited from the Trelegy, fewer side effects in the breztri.  Plan to continue the Trelegy going forward.  Continue prednisone 5 mg at his baseline dose.  Chronic respiratory failure with hypoxia (HCC) Using oxygen 3 L/min pulsed with exertion.  Stage 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 [facility]) He has a PET scan due for tomorrow 07/02/2022.  He will then follow-up with Dr. Myna Hidalgo.  He is on observation right now.  GERD (gastroesophageal reflux disease) Continues to have cough.  This may be due to  his known parenchymal disease but GERD could be a contributor.  We talked about possibly getting back on his omeprazole to see if he gets benefit.   Levy Pupa, MD, PhD 07/01/2022, 8:45 AM [facility] [phone] or if no answer before 7:00PM call [phone] For any issues after 7:00PM please call eLink [phone]

## 2022-07-01 NOTE — Assessment & Plan Note (Signed)
Using oxygen 3 L/min pulsed with exertion.

## 2022-07-01 NOTE — Assessment & Plan Note (Signed)
Doing better since he was treated for pneumonia in early April.  Still has some dry cough.  He has benefited from the Trelegy, fewer side effects in the breztri.  Plan to continue the Trelegy going forward.  Continue prednisone 5 mg at his baseline dose.

## 2022-07-01 NOTE — Addendum Note (Signed)
Addended by: Dorisann Frames R on: 07/01/2022 08:51 AM   Modules accepted: Orders

## 2022-07-01 NOTE — Assessment & Plan Note (Signed)
Continues to have cough.  This may be due to his known parenchymal disease but GERD could be a contributor.  We talked about possibly getting back on his omeprazole to see if he gets benefit.

## 2022-07-01 NOTE — Assessment & Plan Note (Signed)
He has a PET scan due for tomorrow 07/02/2022.  He will then follow-up with Dr. Myna Hidalgo.  He is on observation right now.

## 2022-07-01 NOTE — Patient Instructions (Addendum)
We will plan to continue Trelegy 1 inhalation once daily.  Rinse and gargle after using.  We will send a prescription for this to your pharmacy. Keep albuterol available to use 2 puffs or 1 nebulizer treatment if needed for shortness of breath, chest tightness, wheezing. Continue prednisone 5 mg once daily. Continue oxygen at 3 L/min when you exert yourself Get your PET scan 5/17 as planned and follow with Dr. Myna Hidalgo to review. You could consider restarting your omeprazole to see if this impacts reflux and dry cough Follow Dr. Delton Coombes in 6 months, sooner if you have problems.

## 2022-07-01 NOTE — Progress Notes (Signed)
Call received from [employer] to notify office that John [name] has been authorized from 06/25/22-05/29/23 to be given at [facility]. Auth # [id number].

## 2022-07-02 ENCOUNTER — Encounter (HOSPITAL_COMMUNITY)
Admission: RE | Admit: 2022-07-02 | Discharge: 2022-07-02 | Disposition: A | Discharge status: Home / Self Care | Payer: Managed Care, Other (non HMO) | Source: Ambulatory Visit | Attending: Hematology & Oncology | Admitting: Hematology & Oncology

## 2022-07-02 DIAGNOSIS — C3491 Malignant neoplasm of unspecified part of right bronchus or lung: Secondary | ICD-10-CM | POA: Insufficient documentation

## 2022-07-02 LAB — GLUCOSE, CAPILLARY: Glucose-Capillary: 101 mg/dL — ABNORMAL HIGH (ref 70–99)

## 2022-07-02 MED ORDER — FLUDEOXYGLUCOSE F - 18 (FDG) INJECTION
12.7000 | Freq: Once | INTRAVENOUS | Status: AC
  Administered 2022-07-02: 12.81 via INTRAVENOUS

## 2022-07-07 ENCOUNTER — Inpatient Hospital Stay: Payer: Managed Care, Other (non HMO) | Attending: Internal Medicine

## 2022-07-07 ENCOUNTER — Encounter: Payer: Self-pay | Admitting: Hematology & Oncology

## 2022-07-07 ENCOUNTER — Inpatient Hospital Stay (HOSPITAL_BASED_OUTPATIENT_CLINIC_OR_DEPARTMENT_OTHER): Payer: Managed Care, Other (non HMO) | Admitting: Hematology & Oncology

## 2022-07-07 ENCOUNTER — Other Ambulatory Visit: Payer: Self-pay | Admitting: Nurse Practitioner

## 2022-07-07 VITALS — BP 122/78 | HR 78 | Temp 98.1°F | Resp 18 | Wt 258.0 lb

## 2022-07-07 DIAGNOSIS — C7970 Secondary malignant neoplasm of unspecified adrenal gland: Secondary | ICD-10-CM

## 2022-07-07 DIAGNOSIS — C779 Secondary and unspecified malignant neoplasm of lymph node, unspecified: Secondary | ICD-10-CM | POA: Insufficient documentation

## 2022-07-07 DIAGNOSIS — C3411 Malignant neoplasm of upper lobe, right bronchus or lung: Secondary | ICD-10-CM | POA: Diagnosis present

## 2022-07-07 DIAGNOSIS — C3491 Malignant neoplasm of unspecified part of right bronchus or lung: Secondary | ICD-10-CM

## 2022-07-07 DIAGNOSIS — Z7901 Long term (current) use of anticoagulants: Secondary | ICD-10-CM | POA: Insufficient documentation

## 2022-07-07 DIAGNOSIS — Z9981 Dependence on supplemental oxygen: Secondary | ICD-10-CM

## 2022-07-07 DIAGNOSIS — I482 Chronic atrial fibrillation, unspecified: Secondary | ICD-10-CM | POA: Diagnosis not present

## 2022-07-07 LAB — CMP (CANCER CENTER ONLY)
ALT: 12 U/L (ref 0–44)
AST: 13 U/L — ABNORMAL LOW (ref 15–41)
Albumin: 4.2 g/dL (ref 3.5–5.0)
Alkaline Phosphatase: 60 U/L (ref 38–126)
Anion gap: 8 (ref 5–15)
BUN: 8 mg/dL (ref 6–20)
CO2: 26 mmol/L (ref 22–32)
Calcium: 8.7 mg/dL — ABNORMAL LOW (ref 8.9–10.3)
Chloride: 106 mmol/L (ref 98–111)
Creatinine: 1.14 mg/dL (ref 0.61–1.24)
GFR, Estimated: >60 mL/min (ref >60)
Glucose, Bld: 102 mg/dL — ABNORMAL HIGH (ref 70–99)
Potassium: 4 mmol/L (ref 3.5–5.1)
Sodium: 140 mmol/L (ref 135–145)
Total Bilirubin: 0.7 mg/dL (ref 0.3–1.2)
Total Protein: 6.7 g/dL (ref 6.5–8.1)

## 2022-07-07 LAB — CBC WITH DIFFERENTIAL (CANCER CENTER ONLY)
Abs Immature Granulocytes: 0.01 10*3/uL (ref 0.00–0.07)
Basophils Absolute: 0 10*3/uL (ref 0.0–0.1)
Basophils Relative: 1 %
Eosinophils Absolute: 0.2 10*3/uL (ref 0.0–0.5)
Eosinophils Relative: 3 %
HCT: 39.4 % (ref 39.0–52.0)
Hemoglobin: 12.7 g/dL — ABNORMAL LOW (ref 13.0–17.0)
Immature Granulocytes: 0 %
Lymphocytes Relative: 13 %
Lymphs Abs: 0.8 10*3/uL (ref 0.7–4.0)
MCH: 30.5 pg (ref 26.0–34.0)
MCHC: 32.2 g/dL (ref 30.0–36.0)
MCV: 94.5 fL (ref 80.0–100.0)
Monocytes Absolute: 0.8 10*3/uL (ref 0.1–1.0)
Monocytes Relative: 12 %
Neutro Abs: 4.7 10*3/uL (ref 1.7–7.7)
Neutrophils Relative %: 71 %
Platelet Count: 195 10*3/uL (ref 150–400)
RBC: 4.17 MIL/uL — ABNORMAL LOW (ref 4.22–5.81)
RDW: 15.5 % (ref 11.5–15.5)
WBC Count: 6.6 10*3/uL (ref 4.0–10.5)
nRBC: 0 % (ref 0.0–0.2)

## 2022-07-07 LAB — LACTATE DEHYDROGENASE: LDH: 203 U/L — ABNORMAL HIGH (ref 98–192)

## 2022-07-07 LAB — PREALBUMIN: Prealbumin: 22 mg/dL (ref 18–38)

## 2022-07-07 NOTE — Progress Notes (Signed)
[name] + Hematology and Oncology Follow Up Visit  John [name] [id number] June 02, 1960 61 y.o. 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 3 months --next dose in 07/2022   Interim History:  John [name] is here today for follow-up.  He comes in with his supplemental oxygen.  He only wears the oxygen when he is active.  We did go ahead and do a PET scan on him.  This was done on 07/02/2022.  The PET scan, family, really only showed an area above moderate hypermetabolism in the right lower lung.  There is no pulmonary nodules or mediastinal/hilar lymph nodes.  He had previous resolution of right adrenal gland.  Resolution of hypermetabolic T5 lesion.  He has some new areas of metabolism in the right second and third ribs which appear to be small fractures.  This is probably from where he was coughing a lot.  At this point, I still would just watch him.  His quality of life is actually quite good.  I am just not sure that treating him right now is really going to have that much of a impact on his quality of life but I think would certainly impact his quality of life.  He has gained weight.  I also think that gaining weight is not a bad thing for him.  I told him that cancer patients, when the cancer is progressing, have weight loss.  His albumin is 4.2 which is fantastic.  He has had no problems with pain.  There is no change in bowel or bladder habits.  He has had no issues with cough or chest wall pain.  He does have the chronic atrial fibrillation.  He is on Eliquis for this.  Overall, I would have to say that his  performance status is probably ECOG 1.    Medications:  Allergies as of 07/07/2022       Reactions   Albuterol Other (See Comments)   Patient has had episode of atrial fib following administration of albuterol (tolerates Xopenex well)   Atorvastatin Other (See Comments)   Causes arthritis pain        Medication List        Accurate as of Jul 07, 2022  1:27 PM. If you have any questions, ask your nurse or doctor.          amiodarone 200 MG tablet Commonly known as: Pacerone Take 1 tablet (200 mg total) by mouth daily.   amoxicillin-clavulanate 875-125 MG tablet Commonly known as: AUGMENTIN Take 1 tablet by mouth 2 (two) times daily.   Eliquis 5 MG Tabs tablet Generic drug: apixaban Take 1 tablet (5 mg total) by mouth 2 (two) times daily.   HYDROcodone bit-homatropine 5-1.5 MG/5ML syrup Commonly known as: HYCODAN Take 5 mLs by mouth every 6 (six) hours as needed for cough.   metoprolol succinate 25 MG 24 hr tablet Commonly known as: TOPROL-XL Take 25 mg by mouth daily.   omeprazole 20 MG capsule Commonly known as: PRILOSEC Take 1 capsule (20 mg total) by mouth daily.  PARoxetine 10 MG tablet Commonly known as: PAXIL Take 1 tablet (10 mg total) by mouth at bedtime.   predniSONE 5 MG tablet Commonly known as: DELTASONE TAKE 1 TABLET(5 MG) BY MOUTH DAILY What changed: See the new instructions.   Repatha SureClick 140 MG/ML Soaj Generic drug: Evolocumab Inject 140 mg into the skin every 14 (fourteen) days.   Trelegy Ellipta 100-62.5-25 MCG/ACT Aepb Generic drug: Fluticasone-Umeclidin-Vilant Inhale 1 puff into the lungs daily.        Allergies:  Allergies  Allergen Reactions   Albuterol Other (See Comments)    Patient has had episode of atrial fib following administration of albuterol (tolerates Xopenex well)   Atorvastatin Other (See Comments)    Causes arthritis pain    Past Medical History, Surgical history, Social history, and Family History  were reviewed and updated.  Review of Systems: Review of Systems  Constitutional:  Positive for malaise/fatigue.  HENT: Negative.    Eyes: Negative.   Respiratory:  Positive for cough and shortness of breath.   Cardiovascular:  Positive for chest pain.  Gastrointestinal: Negative.   Genitourinary: Negative.   Musculoskeletal:  Positive for joint pain.  Skin: Negative.   Neurological:  Positive for focal weakness.  Endo/Heme/Allergies: Negative.   Psychiatric/Behavioral: Negative.       Physical Exam:  weight is 258 lb (117 kg). His oral temperature is 98.1 F (36.7 C). His blood pressure is 122/78 and his pulse is 78. His respiration is 18 and oxygen saturation is 95%.   Wt Readings from Last 3 Encounters:  07/07/22 258 lb (117 kg)  07/01/22 256 lb 3.2 oz (116.2 kg)  06/14/22 255 lb 12.8 oz (116 kg)    Physical Exam Vitals reviewed.  HENT:     Head: Normocephalic and atraumatic.  Eyes:     Pupils: Pupils are equal, round, and reactive to light.  Cardiovascular:     Rate and Rhythm: Normal rate and regular rhythm.     Heart sounds: Normal heart sounds.  Pulmonary:     Effort: Pulmonary effort is normal.     Breath sounds: Normal breath sounds.  Abdominal:     General: Bowel sounds are normal.     Palpations: Abdomen is soft.  Musculoskeletal:        General: No tenderness or deformity. Normal range of motion.     Cervical back: Normal range of motion.  Lymphadenopathy:     Cervical: No cervical adenopathy.  Skin:    General: Skin is warm and dry.     Findings: No erythema or rash.  Neurological:     Mental Status: He is alert and oriented to person, place, and time.  Psychiatric:        Behavior: Behavior normal.        Thought Content: Thought content normal.        Judgment: Judgment normal.    Lab Results  Component Value Date   WBC 6.6 07/07/2022   HGB 12.7 (L) 07/07/2022   HCT 39.4 07/07/2022   MCV 94.5 07/07/2022   PLT 195 07/07/2022   Lab  Results  Component Value Date   FERRITIN 446 (H) 08/23/2021   IRON 189 (H) 08/26/2021   TIBC 213 (L) 08/26/2021   UIBC 24 08/26/2021   IRONPCTSAT 89 (H) 08/26/2021   Lab Results  Component Value Date   RETICCTPCT 1.9 08/23/2021   RBC 4.17 (L) 07/07/2022   No results found for: "KPAFRELGTCHN", "LAMBDASER", "KAPLAMBRATIO" No results found for: "IGGSERUM", "IGA", "  IGMSERUM" No results found for: "TOTALPROTELP", "ALBUMINELP", "A1GS", "A2GS", "BETS", "BETA2SER", "GAMS", "MSPIKE", "SPEI"   Chemistry      Component Value Date/Time   NA 140 07/07/2022 1235   NA 141 03/01/2022 0749   K 4.0 07/07/2022 1235   CL 106 07/07/2022 1235   CO2 26 07/07/2022 1235   BUN 8 07/07/2022 1235   BUN 11 03/01/2022 0749   CREATININE 1.14 07/07/2022 1235      Component Value Date/Time   CALCIUM 8.7 (L) 07/07/2022 1235   ALKPHOS 60 07/07/2022 1235   AST 13 (L) 07/07/2022 1235   ALT 12 07/07/2022 1235   BILITOT 0.7 07/07/2022 1235       Impression and Plan: Mr. Stensrud is a very pleasant 62 yo gentleman with metastatic squamous cell carcinoma of the right lung.  The tumor had high PD-L1 score.  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  Again, I really am happy with the PET scan.  He really is not symptomatic from this area in the right lower lung.  I told him that I probably would repeat a PET scan in about 2 months.  I think this would be helpful.  Again, I really want his quality of life to be as good as possible.  He is certainly does deserve this.  I would like to see him back in about a month.     Josph Macho, MD 5/22/20241:27 PM

## 2022-07-19 ENCOUNTER — Encounter: Payer: Self-pay | Admitting: *Deleted

## 2022-07-27 ENCOUNTER — Ambulatory Visit: Payer: Managed Care, Other (non HMO) | Attending: Cardiology

## 2022-07-27 DIAGNOSIS — E782 Mixed hyperlipidemia: Secondary | ICD-10-CM

## 2022-07-27 DIAGNOSIS — Z79899 Other long term (current) drug therapy: Secondary | ICD-10-CM

## 2022-07-28 LAB — ALT: ALT: 12 IU/L (ref 0–44)

## 2022-07-28 LAB — LIPID PANEL
Chol/HDL Ratio: 3.1 ratio (ref 0.0–5.0)
Cholesterol, Total: 103 mg/dL (ref 100–199)
HDL: 33 mg/dL — ABNORMAL LOW (ref >39)
LDL Chol Calc (NIH): 45 mg/dL (ref 0–99)
Triglycerides: 145 mg/dL (ref 0–149)
VLDL Cholesterol Cal: 25 mg/dL (ref 5–40)

## 2022-08-18 ENCOUNTER — Encounter: Payer: Self-pay | Admitting: Hematology & Oncology

## 2022-08-18 ENCOUNTER — Other Ambulatory Visit: Payer: Self-pay

## 2022-08-18 ENCOUNTER — Inpatient Hospital Stay: Payer: Managed Care, Other (non HMO) | Attending: Internal Medicine

## 2022-08-18 ENCOUNTER — Other Ambulatory Visit: Payer: Self-pay | Admitting: *Deleted

## 2022-08-18 ENCOUNTER — Inpatient Hospital Stay (HOSPITAL_BASED_OUTPATIENT_CLINIC_OR_DEPARTMENT_OTHER): Payer: Managed Care, Other (non HMO) | Admitting: Hematology & Oncology

## 2022-08-18 ENCOUNTER — Inpatient Hospital Stay: Payer: Managed Care, Other (non HMO)

## 2022-08-18 VITALS — BP 108/78 | HR 72 | Temp 98.2°F | Resp 18 | Ht 72.0 in | Wt 263.0 lb

## 2022-08-18 DIAGNOSIS — C3491 Malignant neoplasm of unspecified part of right bronchus or lung: Secondary | ICD-10-CM

## 2022-08-18 DIAGNOSIS — C779 Secondary and unspecified malignant neoplasm of lymph node, unspecified: Secondary | ICD-10-CM | POA: Insufficient documentation

## 2022-08-18 DIAGNOSIS — C7970 Secondary malignant neoplasm of unspecified adrenal gland: Secondary | ICD-10-CM | POA: Diagnosis not present

## 2022-08-18 DIAGNOSIS — C3411 Malignant neoplasm of upper lobe, right bronchus or lung: Secondary | ICD-10-CM | POA: Insufficient documentation

## 2022-08-18 DIAGNOSIS — D702 Other drug-induced agranulocytosis: Secondary | ICD-10-CM

## 2022-08-18 LAB — CBC WITH DIFFERENTIAL (CANCER CENTER ONLY)
Abs Immature Granulocytes: 0.01 10*3/uL (ref 0.00–0.07)
Basophils Absolute: 0 10*3/uL (ref 0.0–0.1)
Basophils Relative: 1 %
Eosinophils Absolute: 0.2 10*3/uL (ref 0.0–0.5)
Eosinophils Relative: 5 %
HCT: 39.2 % (ref 39.0–52.0)
Hemoglobin: 12.8 g/dL — ABNORMAL LOW (ref 13.0–17.0)
Immature Granulocytes: 0 %
Lymphocytes Relative: 22 %
Lymphs Abs: 0.8 10*3/uL (ref 0.7–4.0)
MCH: 30.9 pg (ref 26.0–34.0)
MCHC: 32.7 g/dL (ref 30.0–36.0)
MCV: 94.7 fL (ref 80.0–100.0)
Monocytes Absolute: 0.4 10*3/uL (ref 0.1–1.0)
Monocytes Relative: 11 %
Neutro Abs: 2.2 10*3/uL (ref 1.7–7.7)
Neutrophils Relative %: 61 %
Platelet Count: 161 10*3/uL (ref 150–400)
RBC: 4.14 MIL/uL — ABNORMAL LOW (ref 4.22–5.81)
RDW: 14 % (ref 11.5–15.5)
WBC Count: 3.6 10*3/uL — ABNORMAL LOW (ref 4.0–10.5)
nRBC: 0 % (ref 0.0–0.2)

## 2022-08-18 LAB — CMP (CANCER CENTER ONLY)
ALT: 9 U/L (ref 0–44)
AST: 14 U/L — ABNORMAL LOW (ref 15–41)
Albumin: 3.9 g/dL (ref 3.5–5.0)
Alkaline Phosphatase: 65 U/L (ref 38–126)
Anion gap: 7 (ref 5–15)
BUN: 10 mg/dL (ref 8–23)
CO2: 29 mmol/L (ref 22–32)
Calcium: 8.8 mg/dL — ABNORMAL LOW (ref 8.9–10.3)
Chloride: 105 mmol/L (ref 98–111)
Creatinine: 1.22 mg/dL (ref 0.61–1.24)
GFR, Estimated: >60 mL/min (ref >60)
Glucose, Bld: 92 mg/dL (ref 70–99)
Potassium: 3.9 mmol/L (ref 3.5–5.1)
Sodium: 141 mmol/L (ref 135–145)
Total Bilirubin: 0.7 mg/dL (ref 0.3–1.2)
Total Protein: 6.8 g/dL (ref 6.5–8.1)

## 2022-08-18 LAB — PREALBUMIN: Prealbumin: 21 mg/dL (ref 18–38)

## 2022-08-18 LAB — LACTATE DEHYDROGENASE: LDH: 205 U/L — ABNORMAL HIGH (ref 98–192)

## 2022-08-18 MED ORDER — DENOSUMAB 120 MG/1.7ML ~~LOC~~ SOLN
120.0000 mg | Freq: Once | SUBCUTANEOUS | Status: AC
  Administered 2022-08-18: 120 mg via SUBCUTANEOUS
  Filled 2022-08-18: qty 1.7

## 2022-08-18 MED ORDER — PREDNISONE 5 MG PO TABS: 5.0000 mg | ORAL_TABLET | Freq: Every day | ORAL | 4 refills | Status: DC

## 2022-08-18 NOTE — Progress Notes (Signed)
[facility] + Hematology and Oncology Follow Up Visit  John [name] [id number] [birth year] 62 y.o. 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 3 months --next dose in 10/2022   Interim History:  John [name] is here today for follow-up.  He actually had a birthday about a week or so ago.  He had a wonderful birthday.  I am so happy that he could enjoy.  He comes in without any oxygen.  I think is also is a good sign.  He does have the chronic atrial fibrillation.  He is still on the amiodarone and anticoagulation for this.  He has had no problems with pain.  He has gained weight.  He is eating quite a bit.  He has had no change in bowel or bladder habits.  He has had no rashes.  He is at little bit of leg swelling which is chronic.  He has had no back discomfort.  He has had the kyphoplasty and his spine which really helped.  He also had radiation therapy.  He has had no bleeding.  Overall, I would say that his performance status is probably ECOG 1.     Medications:  Allergies as of 08/18/2022       Reactions   Albuterol Other (See Comments)   Patient has had episode of atrial fib following administration of albuterol (tolerates Xopenex well)   Atorvastatin Other (See Comments)   Causes arthritis pain        Medication List        Accurate as of August 18, 2022  9:41 AM. If you have any questions, ask your nurse or doctor.          amiodarone 200 MG tablet Commonly known as: Pacerone Take 1 tablet (200 mg total) by mouth daily.   amoxicillin-clavulanate 875-125 MG tablet Commonly known as:  AUGMENTIN Take 1 tablet by mouth 2 (two) times daily.   Eliquis 5 MG Tabs tablet Generic drug: apixaban Take 1 tablet (5 mg total) by mouth 2 (two) times daily.   HYDROcodone bit-homatropine 5-1.5 MG/5ML syrup Commonly known as: HYCODAN Take 5 mLs by mouth every 6 (six) hours as needed for cough.   metoprolol succinate 25 MG 24 hr tablet Commonly known as: TOPROL-XL Take 1 tablet (25 mg total) by mouth daily.   omeprazole 20 MG capsule Commonly known as: PRILOSEC Take 1 capsule (20 mg total) by mouth daily.   PARoxetine 10 MG tablet Commonly known as: PAXIL Take 1 tablet (10 mg total) by mouth at bedtime.   predniSONE 5 MG tablet Commonly known as: DELTASONE TAKE 1 TABLET(5 MG) BY MOUTH DAILY What changed: See the new instructions.   Repatha SureClick 140 MG/ML Soaj Generic drug: Evolocumab Inject 140 mg into the skin every 14 (fourteen) days.   Trelegy Ellipta 100-62.5-25 MCG/ACT Aepb Generic drug: Fluticasone-Umeclidin-Vilant Inhale 1 puff into the lungs daily.        Allergies:  Allergies  Allergen Reactions   Albuterol Other (See Comments)    Patient has had episode of atrial fib following administration  of albuterol (tolerates Xopenex well)   Atorvastatin Other (See Comments)    Causes arthritis pain    Past Medical History, Surgical history, Social history, and Family History were reviewed and updated.  Review of Systems: Review of Systems  Constitutional:  Positive for malaise/fatigue.  HENT: Negative.    Eyes: Negative.   Respiratory:  Positive for cough and shortness of breath.   Cardiovascular:  Positive for chest pain.  Gastrointestinal: Negative.   Genitourinary: Negative.   Musculoskeletal:  Positive for joint pain.  Skin: Negative.   Neurological:  Positive for focal weakness.  Endo/Heme/Allergies: Negative.   Psychiatric/Behavioral: Negative.       Physical Exam:  Temperature is 98.2.  Pulse 72.  Blood pressure 108/78.  Weight is  263 pounds.  Wt Readings from Last 3 Encounters:  07/07/22 258 lb (117 kg)  07/01/22 256 lb 3.2 oz (116.2 kg)  06/14/22 255 lb 12.8 oz (116 kg)    Physical Exam Vitals reviewed.  HENT:     Head: Normocephalic and atraumatic.  Eyes:     Pupils: Pupils are equal, round, and reactive to light.  Cardiovascular:     Rate and Rhythm: Normal rate and regular rhythm.     Heart sounds: Normal heart sounds.  Pulmonary:     Effort: Pulmonary effort is normal.     Breath sounds: Normal breath sounds.  Abdominal:     General: Bowel sounds are normal.     Palpations: Abdomen is soft.  Musculoskeletal:        General: No tenderness or deformity. Normal range of motion.     Cervical back: Normal range of motion.  Lymphadenopathy:     Cervical: No cervical adenopathy.  Skin:    General: Skin is warm and dry.     Findings: No erythema or rash.  Neurological:     Mental Status: He is alert and oriented to person, place, and time.  Psychiatric:        Behavior: Behavior normal.        Thought Content: Thought content normal.        Judgment: Judgment normal.    Lab Results  Component Value Date   WBC 3.6 (L) 08/18/2022   HGB 12.8 (L) 08/18/2022   HCT 39.2 08/18/2022   MCV 94.7 08/18/2022   PLT 161 08/18/2022   Lab Results  Component Value Date   FERRITIN 446 (H) 08/23/2021   IRON 189 (H) 08/26/2021   TIBC 213 (L) 08/26/2021   UIBC 24 08/26/2021   IRONPCTSAT 89 (H) 08/26/2021   Lab Results  Component Value Date   RETICCTPCT 1.9 08/23/2021   RBC 4.14 (L) 08/18/2022   No results found for: "KPAFRELGTCHN", "LAMBDASER", "KAPLAMBRATIO" No results found for: "IGGSERUM", "IGA", "IGMSERUM" No results found for: "TOTALPROTELP", "ALBUMINELP", "A1GS", "A2GS", "BETS", "BETA2SER", "GAMS", "MSPIKE", "SPEI"   Chemistry      Component Value Date/Time   NA 140 07/07/2022 1235   NA 141 03/01/2022 0749   K 4.0 07/07/2022 1235   CL 106 07/07/2022 1235   CO2 26 07/07/2022 1235   BUN 8  07/07/2022 1235   BUN 11 03/01/2022 0749   CREATININE 1.14 07/07/2022 1235      Component Value Date/Time   CALCIUM 8.7 (L) 07/07/2022 1235   ALKPHOS 60 07/07/2022 1235   AST 13 (L) 07/07/2022 1235   ALT 12 07/27/2022 0730   ALT 12 07/07/2022 1235   BILITOT 0.7 07/07/2022 1235       Impression and  Plan: John [name] is a very pleasant 62 yo gentleman with metastatic squamous cell carcinoma of the right lung.  The tumor had high PD-L1 score.  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  Overall, his quality life is doing quite well right now.  I am happy about this.  I am so glad that he had a wonderful birthday party.  We probably will set him up with a PET scan sometime in early August.  I think this would be reasonable.  We will plan to see him back ourselves in about 6 weeks.  He will get his Rivka Barbara today.     Josph Macho, MD 7/3/20249:41 AM

## 2022-08-18 NOTE — Patient Instructions (Signed)
Denosumab Injection (Oncology) What is this medication? DENOSUMAB (den oh SUE mab) prevents weakened bones caused by cancer. It may also be used to treat noncancerous bone tumors that cannot be removed by surgery. It can also be used to treat high calcium levels in the blood caused by cancer. It works by blocking a protein that causes bones to break down quickly. This slows down the release of calcium from bones, which lowers calcium levels in your blood. It also makes your bones stronger and less likely to break (fracture). This medicine may be used for other purposes; ask your health care provider or pharmacist if you have questions. COMMON BRAND NAME(S): XGEVA What should I tell my care team before I take this medication? They need to know if you have any of these conditions: Dental disease Having surgery or tooth extraction Infection Kidney disease Low levels of calcium or vitamin D in the blood Malnutrition On hemodialysis Skin conditions or sensitivity Thyroid or parathyroid disease An unusual reaction to denosumab, other medications, foods, dyes, or preservatives Pregnant or trying to get pregnant Breast-feeding How should I use this medication? This medication is for injection under the skin. It is given by your care team in a hospital or clinic setting. A special MedGuide will be given to you before each treatment. Be sure to read this information carefully each time. Talk to your care team about the use of this medication in children. While it may be prescribed for children as young as 13 years for selected conditions, precautions do apply. Overdosage: If you think you have taken too much of this medicine contact a poison control center or emergency room at once. NOTE: This medicine is only for you. Do not share this medicine with others. What if I miss a dose? Keep appointments for follow-up doses. It is important not to miss your dose. Call your care team if you are unable to  keep an appointment. What may interact with this medication? Do not take this medication with any of the following: Other medications containing denosumab This medication may also interact with the following: Medications that lower your chance of fighting infection Steroid medications, such as prednisone or cortisone This list may not describe all possible interactions. Give your health care provider a list of all the medicines, herbs, non-prescription drugs, or dietary supplements you use. Also tell them if you smoke, drink alcohol, or use illegal drugs. Some items may interact with your medicine. What should I watch for while using this medication? Your condition will be monitored carefully while you are receiving this medication. You may need blood work while taking this medication. This medication may increase your risk of getting an infection. Call your care team for advice if you get a fever, chills, sore throat, or other symptoms of a cold or flu. Do not treat yourself. Try to avoid being around people who are sick. You should make sure you get enough calcium and vitamin D while you are taking this medication, unless your care team tells you not to. Discuss the foods you eat and the vitamins you take with your care team. Some people who take this medication have severe bone, joint, or muscle pain. This medication may also increase your risk for jaw problems or a broken thigh bone. Tell your care team right away if you have severe pain in your jaw, bones, joints, or muscles. Tell your care team if you have any pain that does not go away or that gets worse. Talk   to your care team if you may be pregnant. Serious birth defects can occur if you take this medication during pregnancy and for 5 months after the last dose. You will need a negative pregnancy test before starting this medication. Contraception is recommended while taking this medication and for 5 months after the last dose. Your care team  can help you find the option that works for you. What side effects may I notice from receiving this medication? Side effects that you should report to your care team as soon as possible: Allergic reactions--skin rash, itching, hives, swelling of the face, lips, tongue, or throat Bone, joint, or muscle pain Low calcium level--muscle pain or cramps, confusion, tingling, or numbness in the hands or feet Osteonecrosis of the jaw--pain, swelling, or redness in the mouth, numbness of the jaw, poor healing after dental work, unusual discharge from the mouth, visible bones in the mouth Side effects that usually do not require medical attention (report to your care team if they continue or are bothersome): Cough Diarrhea Fatigue Headache Nausea This list may not describe all possible side effects. Call your doctor for medical advice about side effects. You may report side effects to FDA at 1-800-FDA-1088. Where should I keep my medication? This medication is given in a hospital or clinic. It will not be stored at home. NOTE: This sheet is a summary. It may not cover all possible information. If you have questions about this medicine, talk to your doctor, pharmacist, or health care provider.  2024 Elsevier/Gold Standard (2021-06-24 00:00:00)  

## 2022-08-30 NOTE — Progress Notes (Unsigned)
Cardiology Office Note:  .   Date:  09/01/2022  ID:  John [name], DOB [birth year], MRN [id number] PCP: Annett Fabian, MD  [facility] Providers Cardiologist:  Armanda Magic, MD    Patient Profile: .      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  He had been started on systemic chemotherapy and was noted to be in rapid atrial fibrillation in ED.  He was hospitalized for further management of acute CDF diarrhea, sepsis, and febrile neutropenia.  Blood cultures were positive for E. coli and he was started on antibiotics.  He was transition to oral amiodarone and Lopressor at discharge.  No anticoagulation was started at that time due to CHA2DS2-VASc score of 0.  There was a concern on CT imaging that a tumor was pressing on his pulmonary artery. He has also been [facility].   Last cardiology clinic visit was 12/30/2021 with Dr. Mayford Knife. He did not have specific cardiac concerns. He was recommended to undergo calcium score CT due to calcification seen on prior CT. Repeat PFTs were recommended and 1 year follow-up was advised.        History of Present Illness: .   John [name] is a veyr pleasant 62 y.o. male who is here today for follow-up and reports he feels blessed. Cancer is stable and the adrenal mass that was previously seen has gone away. He is concerned about  the long-term effects of amiodarone. Had abnormalities seen by eye doctor recently. She told him they would resolve once he stops amiodarone. No evidence of a fib, no palpitations or tachycardia to his awareness. He has chronic DOE that he feels is stable. Occasional mild left ankle edema, worsens at the end of the day. Concerned about his heart, has a strong family hx of CAD and one brother has a pacemaker. He denies chest pain, palpitations, presyncope, syncope, orthopnea, and PND.  ROS: See HPI       Studies Reviewed: [name]   EKG Interpretation Date/Time:  Wednesday September 01 2022 11:12:23 EDT Ventricular Rate:  67 PR Interval:  156 QRS Duration:  100 QT Interval:  444 QTC Calculation: 469 R Axis:   -84  Text Interpretation: Normal sinus rhythm Left axis deviation Low voltage QRS Inferior infarct , age undetermined Cannot rule out Anterior infarct , age undetermined When compared with ECG of 23-Aug-2021 15:23, PREVIOUS ECG IS PRESENT Confirmed by Eligha Bridegroom [phone]) on 09/01/2022 12:08:50 PM     Risk Assessment/Calculations:    CHA2DS2-VASc Score = 2   This indicates a 2.2% annual risk of stroke. The patient's score is based upon: CHF History: 0 HTN History: 1 Diabetes History: 0 Stroke History: 0 Vascular Disease History: 1 Age Score: 0 Gender Score: 0            Physical Exam:   VS:  BP 114/76   Pulse 78   Ht 6' (1.829 m)   Wt 262 lb (118.8 kg)   SpO2 92%  BMI 35.53 kg/m    Wt Readings from Last 3 Encounters:  09/01/22 262 lb (118.8 kg)  08/18/22 263 lb (119.3 kg)  07/07/22 258 lb (117 kg)    GEN: Well nourished, well developed in no acute distress NECK: No JVD; No carotid bruits CARDIAC: RRR, no murmurs, rubs, gallops RESPIRATORY:  Clear to auscultation without rales, wheezing or rhonchi  ABDOMEN: Soft, non-tender, non-distended EXTREMITIES:  No edema; No deformity     ASSESSMENT AND PLAN: .    DOE: History of stage IV squamous cell carcinoma and likely  interstitial lung issues secondary to immunotherapy per oncology. Has chronic DOE that he feels is stable. He continues to follow with oncology and pulmonology. No acute concerns today. As noted below, recommendation to undergo ischemia evaluation to no CAD that is contributing to DOE.   Abnormal EKG: EKG reveals low voltage, concern for prior inferior infarct versus incomplete bundle branch block, not seen on most recent EKG. He denies chest pain.  Has undergone treatment for metastatic squamous cell carcinoma of the right lung as well as adrenal metastasis. No prior ischemia evaluation. With his chronic DOE in addition, we will get coronary CTA for mapping of coronary anatomy.  Persistent atrial fibrillation on chronic anticoagulation: Lengthy discussion about the long term effects of amiodarone. He had eye exam recently and discussed the benefit of stopping amiodarone in regards to his eyes. He is also aware that amiodarone could worsen his lung disease.  He reports each episode of A-fib has occurred following chemotherapy.  He is not currently on chemotherapy.  Advised him to go ahead and decrease amiodarone to 100 mg daily for 1 week and then stop.   Advised that if a fib returns, would recommend a fib specialist to discuss treatment options. No bleeding concerns. Continue Eliquis 5 mg twice daily which is appropriate dose for stroke prevention for CHA2DS2-VASc score of 3.  CAD: Coronary artery calcification and aortic atherosclerosis noted on CT. He has an abnormal EKG with concerns for previous inferior infarct. He has not had chest pain but has chronic DOE. He does not exercise on a consistent basis.  He has a strong family history of CAD.  We will get coronary CTA for mapping of coronary anatomy. We will have him take ivabradine 15 mg 2 hours prior to CT.  Hyperlipidemia: LDL 45 on 07/27/2022. Continue Repatha.        Dispo: 2-3 months with me  Signed, Eligha Bridegroom, NP-C

## 2022-09-01 ENCOUNTER — Ambulatory Visit: Payer: Managed Care, Other (non HMO) | Attending: Cardiology | Admitting: Nurse Practitioner

## 2022-09-01 ENCOUNTER — Encounter: Payer: Self-pay | Admitting: Nurse Practitioner

## 2022-09-01 VITALS — BP 114/76 | HR 78 | Ht 72.0 in | Wt 262.0 lb

## 2022-09-01 DIAGNOSIS — E785 Hyperlipidemia, unspecified: Secondary | ICD-10-CM

## 2022-09-01 DIAGNOSIS — I2510 Atherosclerotic heart disease of native coronary artery without angina pectoris: Secondary | ICD-10-CM

## 2022-09-01 DIAGNOSIS — R9431 Abnormal electrocardiogram [ECG] [EKG]: Secondary | ICD-10-CM

## 2022-09-01 DIAGNOSIS — C3491 Malignant neoplasm of unspecified part of right bronchus or lung: Secondary | ICD-10-CM

## 2022-09-01 DIAGNOSIS — R0609 Other forms of dyspnea: Secondary | ICD-10-CM | POA: Diagnosis not present

## 2022-09-01 DIAGNOSIS — D6869 Other thrombophilia: Secondary | ICD-10-CM

## 2022-09-01 DIAGNOSIS — I4819 Other persistent atrial fibrillation: Secondary | ICD-10-CM | POA: Diagnosis not present

## 2022-09-01 MED ORDER — DILTIAZEM HCL 30 MG PO TABS: ORAL_TABLET | ORAL | 6 refills | Status: DC

## 2022-09-01 MED ORDER — IVABRADINE HCL 5 MG PO TABS: ORAL_TABLET | ORAL | 0 refills | Status: DC

## 2022-09-01 NOTE — Patient Instructions (Signed)
Medication Instructions:  Your physician has recommended you make the following change in your medication:  DECREASE Amiodarone 100mg  (one half of current tablet) for 1 week and then STOP. START Diltiazem(Cardizem) 30mg  1 tablet as needed for palpitations or Afib, up to 4 times daily.  *If you need a refill on your cardiac medications before your next appointment, please call your pharmacy*   Lab Work: None  If you have labs (blood work) drawn today and your tests are completely normal, you will receive your results only by: MyChart Message (if you have MyChart) OR A paper copy in the mail If you have any lab test that is abnormal or we need to change your treatment, we will call you to review the results.   Testing/Procedures:   Your cardiac CT will be scheduled at one of the below locations:   [facility] [address] [place], [place] [address] [phone]   Please arrive at the [facility] and Children's Entrance (Entrance C2) of [facility] 30 minutes prior to test start time. You can use the FREE valet parking offered at entrance C (encouraged to control the heart rate for the test)  Proceed to the [facility] Radiology Department (first floor) to check-in and test prep.  All radiology patients and guests should use entrance C2 at [facility], accessed from [facility], even though the hospital's physical address listed is [address]     Please follow these instructions carefully (unless otherwise directed):  An IV will be required for this test and Nitroglycerin will be given.  Hold all erectile dysfunction medications at least 3 days (72 hrs) prior to test. (Ie viagra, cialis, sildenafil, tadalafil, etc)   On the Night Before the Test: Be sure to Drink plenty of water. Do not consume any caffeinated/decaffeinated beverages or chocolate 12 hours prior to your test. Do not take any antihistamines 12 hours prior to  your test.  On the Day of the Test: Drink plenty of water until 1 hour prior to the test. Do not eat any food 1 hour prior to test. You may take your regular medications prior to the test.  Take ivabradine(Corlanor) 3 tablets (15mg ) two hours prior to test.   After the Test: Drink plenty of water. After receiving IV contrast, you may experience a mild flushed feeling. This is normal. On occasion, you may experience a mild rash up to 24 hours after the test. This is not dangerous. If this occurs, you can take Benadryl 25 mg and increase your fluid intake. If you experience trouble breathing, this can be serious. If it is severe call 911 IMMEDIATELY. If it is mild, please call our office.  We will call to schedule your test 2-4 weeks out understanding that some insurance companies will need an authorization prior to the service being performed.   For more information and frequently asked questions, please visit our website : [email or web address]  For non-scheduling related questions, please contact the cardiac imaging nurse navigator should you have any questions/concerns: Cardiac Imaging Nurse Navigators Direct Office Dial: [phone]   For scheduling needs, including cancellations and rescheduling, please call [place], [phone].    Follow-Up: At [facility], you and your health needs are our priority.  As part of our continuing mission to provide you with exceptional heart care, we have created designated Provider Care Teams.  These Care Teams include your primary Cardiologist (physician) and Advanced Practice Providers (APPs -  Physician Assistants and  Nurse Practitioners) who all work together to provide you with the care you need, when you need it.  We recommend signing up for the patient portal called "MyChart".  Sign up information is provided on this After Visit Summary.  MyChart is used to connect with patients for Virtual Visits (Telemedicine).   Patients are able to view lab/test results, encounter notes, upcoming appointments, etc.  Non-urgent messages can be sent to your provider as well.   To learn more about what you can do with MyChart, go to [email or web address].    Your next appointment:   2 month(s)  Provider:   Eligha Bridegroom, NP

## 2022-09-08 ENCOUNTER — Encounter (HOSPITAL_COMMUNITY): Payer: Self-pay

## 2022-09-09 ENCOUNTER — Other Ambulatory Visit: Payer: Self-pay | Admitting: Nurse Practitioner

## 2022-09-10 ENCOUNTER — Ambulatory Visit (HOSPITAL_COMMUNITY)
Admission: RE | Admit: 2022-09-10 | Discharge: 2022-09-10 | Disposition: A | Discharge status: Home / Self Care | Payer: Managed Care, Other (non HMO) | Source: Ambulatory Visit | Attending: Nurse Practitioner | Admitting: Nurse Practitioner

## 2022-09-10 DIAGNOSIS — R0609 Other forms of dyspnea: Secondary | ICD-10-CM | POA: Insufficient documentation

## 2022-09-10 DIAGNOSIS — I4819 Other persistent atrial fibrillation: Secondary | ICD-10-CM | POA: Insufficient documentation

## 2022-09-10 DIAGNOSIS — I2510 Atherosclerotic heart disease of native coronary artery without angina pectoris: Secondary | ICD-10-CM | POA: Insufficient documentation

## 2022-09-10 DIAGNOSIS — Q2112 Patent foramen ovale: Secondary | ICD-10-CM

## 2022-09-10 DIAGNOSIS — E785 Hyperlipidemia, unspecified: Secondary | ICD-10-CM | POA: Diagnosis not present

## 2022-09-10 MED ORDER — NITROGLYCERIN 0.4 MG SL SUBL
0.8000 mg | SUBLINGUAL_TABLET | Freq: Once | SUBLINGUAL | Status: AC
  Administered 2022-09-10: 0.8 mg via SUBLINGUAL

## 2022-09-10 MED ORDER — IOHEXOL 350 MG/ML SOLN
95.0000 mL | Freq: Once | INTRAVENOUS | Status: AC | PRN
  Administered 2022-09-10: 95 mL via INTRAVENOUS

## 2022-09-10 MED ORDER — IOHEXOL 350 MG/ML SOLN: 100.0000 mL | Freq: Once | INTRAVENOUS | Status: DC | PRN

## 2022-09-10 MED ORDER — NITROGLYCERIN 0.4 MG SL SUBL
SUBLINGUAL_TABLET | SUBLINGUAL | Status: AC
  Filled 2022-09-10: qty 2

## 2022-09-21 ENCOUNTER — Encounter (HOSPITAL_COMMUNITY): Payer: Managed Care, Other (non HMO)

## 2022-10-01 ENCOUNTER — Encounter: Payer: Self-pay | Admitting: Internal Medicine

## 2022-10-08 ENCOUNTER — Encounter (HOSPITAL_COMMUNITY)
Admission: RE | Admit: 2022-10-08 | Discharge: 2022-10-08 | Disposition: A | Discharge status: Home / Self Care | Payer: Managed Care, Other (non HMO) | Source: Ambulatory Visit | Attending: Hematology & Oncology | Admitting: Hematology & Oncology

## 2022-10-08 DIAGNOSIS — C3491 Malignant neoplasm of unspecified part of right bronchus or lung: Secondary | ICD-10-CM | POA: Diagnosis present

## 2022-10-08 LAB — GLUCOSE, CAPILLARY: Glucose-Capillary: 89 mg/dL (ref 70–99)

## 2022-10-08 MED ORDER — FLUDEOXYGLUCOSE F - 18 (FDG) INJECTION
13.0700 | Freq: Once | INTRAVENOUS | Status: AC
  Administered 2022-10-08: 13.07 via INTRAVENOUS

## 2022-10-22 ENCOUNTER — Other Ambulatory Visit: Payer: Self-pay | Admitting: Cardiology

## 2022-10-22 DIAGNOSIS — I4819 Other persistent atrial fibrillation: Secondary | ICD-10-CM

## 2022-10-22 NOTE — Telephone Encounter (Signed)
Prescription refill request for Eliquis received. Indication: Afib  Last office visit: 09/01/22 ([place])  Scr: 1.22 (08/18/22)  Age: 62 Weight: 118.8kg  Appropriate dose. Refill sent.

## 2022-10-29 ENCOUNTER — Telehealth: Payer: Self-pay

## 2022-10-29 NOTE — Telephone Encounter (Signed)
Received call from [place] with Rosann Auerbach stating that pt's most recent John [name] was filled under his medical insurance while it should have been billed using his pharmacy benefit. Archie Patten is questioning if this can be corrected. Tonya's information forward to [name]. Archie Patten phone [phone] extension [phone]

## 2022-11-03 ENCOUNTER — Ambulatory Visit (INDEPENDENT_AMBULATORY_CARE_PROVIDER_SITE_OTHER): Payer: Managed Care, Other (non HMO) | Admitting: Emergency Medicine

## 2022-11-03 DIAGNOSIS — I4819 Other persistent atrial fibrillation: Secondary | ICD-10-CM

## 2022-11-03 DIAGNOSIS — I2510 Atherosclerotic heart disease of native coronary artery without angina pectoris: Secondary | ICD-10-CM

## 2022-11-03 DIAGNOSIS — E785 Hyperlipidemia, unspecified: Secondary | ICD-10-CM

## 2022-11-03 LAB — PULMONARY FUNCTION TEST
DL/VA % pred: 71 %
DL/VA: 3.03 ml/min/mmHg/L
DLCO cor % pred: 41 %
DLCO cor: 11.42 ml/min/mmHg
DLCO unc % pred: 41 %
DLCO unc: 11.42 ml/min/mmHg
FEF 25-75 Pre: 2.39 L/s
FEF2575-%Pred-Pre: 81 %
FEV1-%Pred-Pre: 57 %
FEV1-Pre: 2.06 L
FEV1FVC-%Pred-Pre: 110 %
FEV6-%Pred-Pre: 54 %
FEV6-Pre: 2.45 L
FEV6FVC-%Pred-Pre: 104 %
FVC-%Pred-Pre: 52 %
FVC-Pre: 2.47 L
Pre FEV1/FVC ratio: 84 %
Pre FEV6/FVC Ratio: 100 %
RV % pred: 81 %
RV: 1.86 L
TLC % pred: 67 %
TLC: 4.74 L

## 2022-11-03 NOTE — Patient Instructions (Signed)
Full PFT performed today except Post John [name] because patient refused to take Levalbuterol.

## 2022-11-03 NOTE — Progress Notes (Signed)
Full PFT performed today except Post Cleda Daub because patient refused to take Levalbuterol.

## 2022-11-05 IMAGING — DX DG CHEST 1V PORT
2 series · 2 of 2 positions shown · non-contrast
Comparison: Chest x-ray 04/18/2021.

CLINICAL DATA: 60-year-old male with history of right-sided
pneumothorax. Follow-up study.

EXAM:
PORTABLE CHEST 1 VIEW

[chest ap (1 of 2)]
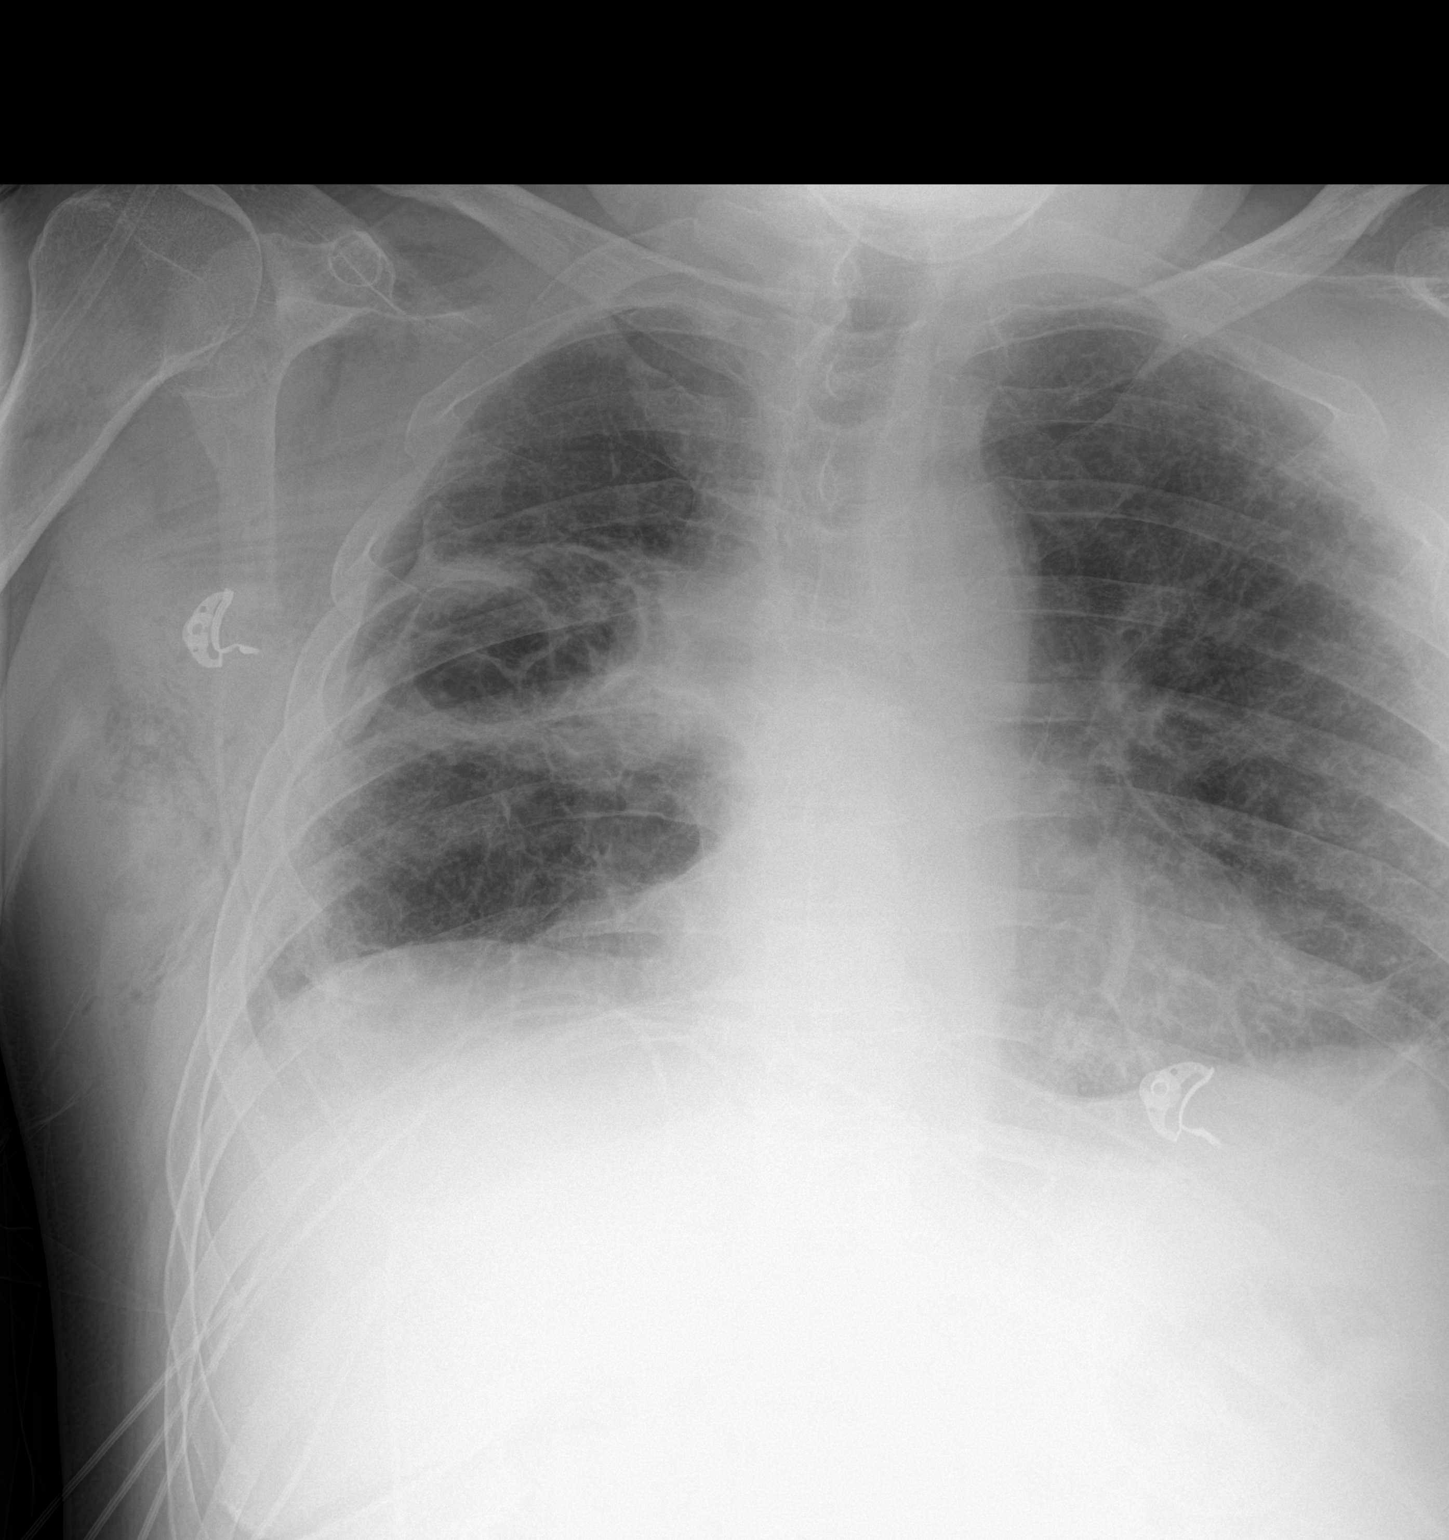

[chest ap (2 of 2)]
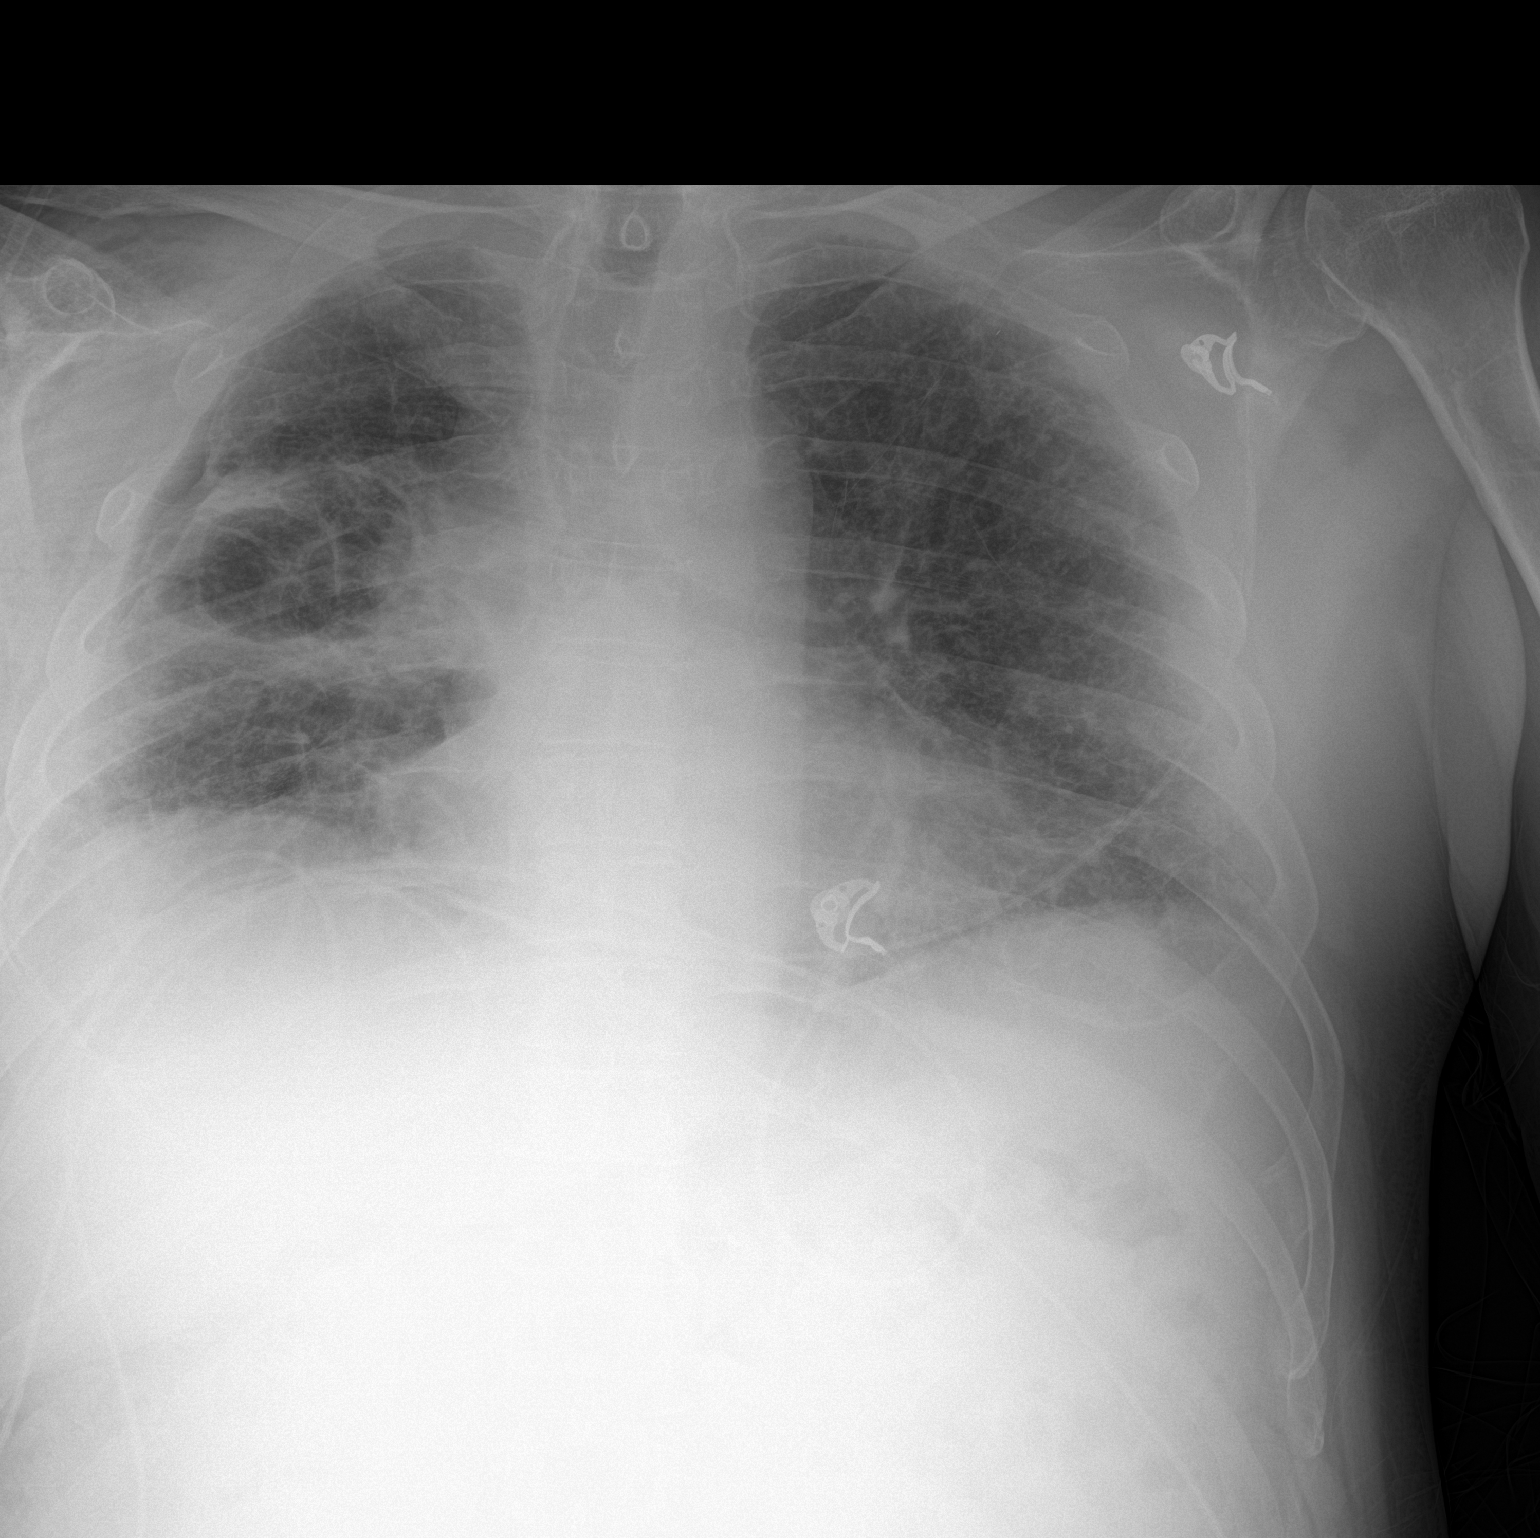

[2 of 2 positions shown; findings below may reference images not displayed]

FINDINGS: Lung volumes remain low with widespread areas of interstitial
prominence scattered throughout the lungs bilaterally, reflective of
chronic interstitial lung disease. Small residual right pneumothorax
noted, decreasing in size compared to the prior study. Ill-defined
opacities in the right mid lung again noted, potentially areas of
advanced scarring and/or residual airspace consolidation. No pleural
effusions. No evidence of pulmonary edema. Heart size is normal.
Small amount of residual subcutaneous emphysema and gas in the
pectoralis musculature in the right chest wall.
IMPRESSION: 1. Resolving small right pneumothorax.
2. Otherwise, the radiographic appearance the chest is essentially
unchanged, as above.

## 2022-11-05 NOTE — Radiation Completion Notes (Signed)
Patient Name: John [name], John [name] MRN: [id number] Date of Birth: 1960-09-19 Referring Physician: Arlan Organ, M.D. Date of Service: 2022-11-05 Radiation Oncologist: Arnette Schaumann, M.D. 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 Staging on 2020-04-10: Stage 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 (HCC) T=cT4, N=cN2, M=cM1c Intent: Curative     ==========DELIVERED PLANS==========  First Treatment Date: 2021-08-07 - Last Treatment Date: 2021-08-19   Plan Name: [id number] Site: Abdomen Technique: 3D Mode: Photon Dose Per Fraction: 5 Gy Prescribed Dose (Delivered / Prescribed): 30 Gy / 50 Gy Prescribed Fxs (Delivered / Prescribed): 6 / 10     ==========ON TREATMENT VISIT DATES========== 2021-08-11, 2021-08-17     ==========UPCOMING VISITS==========       ==========APPENDIX - ON TREATMENT VISIT NOTES==========   See weekly On Treatment Notes in Epic for details.

## 2022-11-06 NOTE — Progress Notes (Unsigned)
Cardiology Office Note:  .   Date:  11/08/2022  ID:  John [name], DOB 1960/09/01, MRN [id number] PCP: Annett Fabian, MD  [facility] Providers Cardiologist:  Armanda Magic, MD    Patient Profile: .      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  He had been started on systemic chemotherapy and was noted to be in rapid atrial fibrillation in ED.  He was hospitalized for further management of acute CDF diarrhea, sepsis, and febrile neutropenia.  Blood cultures were positive for E. coli and he was started on antibiotics.  He was transition to oral amiodarone and Lopressor at discharge.  No anticoagulation was started at that time due to CHA2DS2-VASc score of 0.  There was a concern on CT imaging that a tumor was pressing on his pulmonary artery. He has also been followed by [facility].   Last cardiology clinic visit was 12/30/2021 with Dr. Mayford Knife. He did not have specific cardiac concerns. He was recommended to undergo calcium score CT due to calcification seen on prior CT. Repeat PFTs were recommended and 1 year follow-up was advised.   Seen by me on 09/01/2022 for follow-up and reports he feels blessed. Cancer is stable and the adrenal mass that was previously seen has gone away. He is concerned about the long-term effects of amiodarone. Had abnormalities seen by eye doctor  recently. She told him they would resolve once he stops amiodarone. No evidence of a fib, no palpitations or tachycardia to his awareness. He has chronic DOE that he feels is stable. Occasional mild left ankle edema, worsens at the end of the day. Concerned about his heart, has a strong family hx of CAD and one brother has a pacemaker. He denied chest pain, palpitations, presyncope, syncope, orthopnea, and PND. Amiodarone was decreased to 100 mg daily for one week and then discontinued.  Coronary CTA was ordered for evaluation of ischemia.  CT revealed CAC of 165 (73rd percentile), normal coronary origin with right dominance, minimal calcified plaque 0 to 24% and proximal LAD and proximal LCx. PFO was noted and MD also commented patient had irregular heart rhythm.        History of Present Illness: .   John [name] is a veyr pleasant 62 y.o. male who is here today for follow-up. Reports he is feeling well. Has not felt return of a fib since stopping amiodarone. Has bruising on his arms, recently worked on a dryer. No additional bleeding concerns. Intentional weight loss of 5 lbs. Says he had to do something, he was not eating a good diet. Dr. Myna Hidalgo, oncologist, encouraged him not to lose too much weight. He stopped drinking soda and lost 5 lbs. Has chronic DOE that he feels is stable. Has LE edema that occurs after standing all day, resolves overnight. He denies chest pain, fatigue, palpitations, melena, presyncope, syncope, orthopnea, and PND.   ROS: See HPI       Studies Reviewed: .  Risk Assessment/Calculations:    CHA2DS2-VASc Score = 2   This indicates a 2.2% annual risk of stroke. The patient's score is based upon: CHF History: 0 HTN History: 1 Diabetes History: 0 Stroke History: 0 Vascular Disease History: 1 Age Score: 0 Gender Score: 0            Physical Exam:   VS:  BP 110/66   Pulse 81   Ht 5\' 9"  (1.753 m)   Wt 259 lb (117.5 kg)   BMI 38.25 kg/m    Wt  Readings from Last 3 Encounters:  11/08/22 259 lb (117.5 kg)  09/01/22 262 lb (118.8 kg)  08/18/22 263 lb (119.3 kg)    GEN: Well nourished, well developed in no acute distress NECK: No JVD; No carotid bruits CARDIAC: RRR, no murmurs, rubs, gallops RESPIRATORY:  Clear to auscultation without rales, wheezing or rhonchi  ABDOMEN: Soft, non-tender, non-distended EXTREMITIES:  No edema; No deformity     ASSESSMENT AND PLAN: .    DOE: Recent PFT for follow-up of amiodarone therapy. He did not receive results. He feels his dyspnea is stable. History of stage IV squamous cell carcinoma and likely interstitial lung issues secondary to immunotherapy per oncology. No orthopnea, PND. Has mild bilateral LE at the end of a workday on occasion, resolves overnight. Coronary CTA  completed 09/18/22 revealed no obstructive CAD. He continues to follow with oncology and pulmonology. No acute concerns today.  CAD without angina: Coronary calcium score of 165 (73rd percentile) with non-obstructive CAD (0-24%) on coronary CTA 09/10/22. Results reviewed in detail. He has chronic DOE that he feels is stable. No chest pain or activity intolerance. No indication for further ischemia evaluation at this time.   Persistent atrial fibrillation on chronic anticoagulation: He discontinued amiodarone following last office visit 09/01/22. He has felt well since that time with no further evidence of a fib to his awareness. Clinically appears to be in sinus rhythm today. HR is well controlled. He is not on rate-controlling therapy. Had PFT with pulmonology 11/03/22. Did not get results. Has superficial bruising on his arms but no significant bleeding concerns. Continue Eliquis 5 mg twice daily which is appropriate dose for stroke prevention for CHA2DS2-VASc score of 3. Continue regular eye exams and follow-up with pulmonology.   Hyperlipidemia LDL goal < 70: LDL 45 on 07/27/2022. He has some diffuse itching, asks if this is a common side  effect of Repatha. Side effects not bothersome enough to consider stopping. Continue Repatha.        Dispo: 6 months with Dr. Mayford Knife or me   Signed, Eligha Bridegroom, NP-C

## 2022-11-08 ENCOUNTER — Ambulatory Visit: Payer: Managed Care, Other (non HMO) | Attending: Nurse Practitioner | Admitting: Nurse Practitioner

## 2022-11-08 ENCOUNTER — Encounter: Payer: Self-pay | Admitting: Nurse Practitioner

## 2022-11-08 VITALS — BP 110/66 | HR 81 | Ht 69.0 in | Wt 259.0 lb

## 2022-11-08 DIAGNOSIS — I4819 Other persistent atrial fibrillation: Secondary | ICD-10-CM | POA: Diagnosis not present

## 2022-11-08 DIAGNOSIS — I2510 Atherosclerotic heart disease of native coronary artery without angina pectoris: Secondary | ICD-10-CM | POA: Diagnosis not present

## 2022-11-08 DIAGNOSIS — D6869 Other thrombophilia: Secondary | ICD-10-CM | POA: Diagnosis not present

## 2022-11-08 DIAGNOSIS — R0609 Other forms of dyspnea: Secondary | ICD-10-CM

## 2022-11-08 DIAGNOSIS — E785 Hyperlipidemia, unspecified: Secondary | ICD-10-CM | POA: Diagnosis not present

## 2022-11-08 DIAGNOSIS — C3491 Malignant neoplasm of unspecified part of right bronchus or lung: Secondary | ICD-10-CM

## 2022-11-08 NOTE — Patient Instructions (Signed)
Medication Instructions:   Your physician recommends that you continue on your current medications as directed. Please refer to the Current Medication list given to you today.  *If you need a refill on your cardiac medications before your next appointment, please call your pharmacy*   Lab Work:  None ordered.  If you have labs (blood work) drawn today and your tests are completely normal, you will receive your results only by: MyChart Message (if you have MyChart) OR A paper copy in the mail If you have any lab test that is abnormal or we need to change your treatment, we will call you to review the results.   Testing/Procedures:  None ordered.   Follow-Up: At [facility], you and your health needs are our priority.  As part of our continuing mission to provide you with exceptional heart care, we have created designated Provider Care Teams.  These Care Teams include your primary Cardiologist (physician) and Advanced Practice Providers (APPs -  Physician Assistants and Nurse Practitioners) who all work together to provide you with the care you need, when you need it.  We recommend signing up for the patient portal called "MyChart".  Sign up information is provided on this After Visit Summary.  MyChart is used to connect with patients for Virtual Visits (Telemedicine).  Patients are able to view lab/test results, encounter notes, upcoming appointments, etc.  Non-urgent messages can be sent to your provider as well.   To learn more about what you can do with MyChart, go to [email or web address].    Your next appointment:   6 month(s)  Provider:   Armanda Magic, MD

## 2022-11-12 ENCOUNTER — Other Ambulatory Visit: Payer: Self-pay

## 2022-11-12 ENCOUNTER — Encounter: Payer: Self-pay | Admitting: Hematology & Oncology

## 2022-11-12 ENCOUNTER — Inpatient Hospital Stay (HOSPITAL_BASED_OUTPATIENT_CLINIC_OR_DEPARTMENT_OTHER): Payer: Managed Care, Other (non HMO) | Admitting: Hematology & Oncology

## 2022-11-12 ENCOUNTER — Inpatient Hospital Stay: Payer: Managed Care, Other (non HMO) | Attending: Internal Medicine

## 2022-11-12 VITALS — BP 104/70 | HR 71 | Temp 98.6°F | Resp 19 | Ht 72.0 in | Wt 258.0 lb

## 2022-11-12 DIAGNOSIS — C3411 Malignant neoplasm of upper lobe, right bronchus or lung: Secondary | ICD-10-CM | POA: Insufficient documentation

## 2022-11-12 DIAGNOSIS — C779 Secondary and unspecified malignant neoplasm of lymph node, unspecified: Secondary | ICD-10-CM | POA: Insufficient documentation

## 2022-11-12 DIAGNOSIS — C3491 Malignant neoplasm of unspecified part of right bronchus or lung: Secondary | ICD-10-CM | POA: Diagnosis not present

## 2022-11-12 DIAGNOSIS — C7971 Secondary malignant neoplasm of right adrenal gland: Secondary | ICD-10-CM | POA: Diagnosis not present

## 2022-11-12 LAB — CBC WITH DIFFERENTIAL (CANCER CENTER ONLY)
Abs Immature Granulocytes: 0.01 10*3/uL (ref 0.00–0.07)
Basophils Absolute: 0 10*3/uL (ref 0.0–0.1)
Basophils Relative: 1 %
Eosinophils Absolute: 0.2 10*3/uL (ref 0.0–0.5)
Eosinophils Relative: 4 %
HCT: 41.1 % (ref 39.0–52.0)
Hemoglobin: 13.6 g/dL (ref 13.0–17.0)
Immature Granulocytes: 0 %
Lymphocytes Relative: 19 %
Lymphs Abs: 0.9 10*3/uL (ref 0.7–4.0)
MCH: 30.8 pg (ref 26.0–34.0)
MCHC: 33.1 g/dL (ref 30.0–36.0)
MCV: 93.2 fL (ref 80.0–100.0)
Monocytes Absolute: 0.5 10*3/uL (ref 0.1–1.0)
Monocytes Relative: 11 %
Neutro Abs: 3 10*3/uL (ref 1.7–7.7)
Neutrophils Relative %: 65 %
Platelet Count: 182 10*3/uL (ref 150–400)
RBC: 4.41 MIL/uL (ref 4.22–5.81)
RDW: 14.4 % (ref 11.5–15.5)
WBC Count: 4.6 10*3/uL (ref 4.0–10.5)
nRBC: 0 % (ref 0.0–0.2)

## 2022-11-12 LAB — CMP (CANCER CENTER ONLY)
ALT: 8 U/L (ref 0–44)
AST: 14 U/L — ABNORMAL LOW (ref 15–41)
Albumin: 4 g/dL (ref 3.5–5.0)
Alkaline Phosphatase: 60 U/L (ref 38–126)
Anion gap: 8 (ref 5–15)
BUN: 14 mg/dL (ref 8–23)
CO2: 27 mmol/L (ref 22–32)
Calcium: 8.8 mg/dL — ABNORMAL LOW (ref 8.9–10.3)
Chloride: 105 mmol/L (ref 98–111)
Creatinine: 1.32 mg/dL — ABNORMAL HIGH (ref 0.61–1.24)
GFR, Estimated: >60 mL/min (ref >60)
Glucose, Bld: 84 mg/dL (ref 70–99)
Potassium: 4.5 mmol/L (ref 3.5–5.1)
Sodium: 140 mmol/L (ref 135–145)
Total Bilirubin: 0.7 mg/dL (ref 0.3–1.2)
Total Protein: 7.4 g/dL (ref 6.5–8.1)

## 2022-11-12 LAB — LACTATE DEHYDROGENASE: LDH: 232 U/L — ABNORMAL HIGH (ref 98–192)

## 2022-11-12 NOTE — Progress Notes (Signed)
[facility] + Hematology and Oncology Follow Up Visit  John [name] [id number] 16-Jan-1961 62 y.o. 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 3 months --next dose in 12/2022   Interim History:  John [name] is here today for follow-up.  He actually is doing quite well.  We did do a PET scan on him.  This was done on 10/08/2022.  Surprising, the PET scan really did not show any evidence of disease progression although there was increase in hypermetabolism and a right adrenal lesion.  It had not increased in size.  It was 3 cm.  Otherwise, everything else actually looked quite good.  He is now off amiodarone.  He is still on Eliquis.  He feels quite good.  He is gained too much weight.  He is eating well.  He is working.  He is having no nausea or vomiting.  He has had no change in bowel or bladder habits.  He has had no rashes.  Is been no problems with leg swelling.  Unfortunately, his mother passed away about a month ago.  I know this has been tough on him.  However, he knows where she is.  He is quite happy that she is with her husband.  He has had no cough.  He said no shortness of breath..  He is not wearing any oxygen.  Overall, I would say his performance status is probably ECOG 1.   Medications:  Allergies as of 11/12/2022       Reactions   Albuterol Other (See Comments)   Patient has had episode of atrial fib following administration of albuterol (tolerates Xopenex well)   Atorvastatin Other (See Comments)   Causes arthritis pain        Medication List        Accurate as of November 12, 2022   9:42 AM. If you have any questions, ask your nurse or doctor.          diltiazem 30 MG tablet Commonly known as: Cardizem Take 1 tablet by mouth as need for palpitations or Afib up to 4 times a day   Eliquis 5 MG Tabs tablet Generic drug: apixaban TAKE 1 TABLET(5 MG) BY MOUTH TWICE DAILY   metoprolol succinate 25 MG 24 hr tablet Commonly known as: TOPROL-XL Take 1 tablet (25 mg total) by mouth daily.   omeprazole 20 MG capsule Commonly known as: PRILOSEC Take 1 capsule (20 mg total) by mouth daily.   PARoxetine 10 MG tablet Commonly known as: PAXIL Take 1 tablet (10 mg total) by mouth at bedtime.   predniSONE 5 MG tablet Commonly known as: DELTASONE Take 1 tablet (5 mg total) by mouth daily with breakfast.   Repatha SureClick 140 MG/ML Soaj Generic drug: Evolocumab Inject 140 mg into the skin every 14 (fourteen) days.   Trelegy Ellipta 100-62.5-25 MCG/ACT Aepb Generic drug: Fluticasone-Umeclidin-Vilant Inhale 1 puff into the lungs daily.        Allergies:  Allergies  Allergen Reactions   Albuterol Other (See Comments)    Patient has had episode of  atrial fib following administration of albuterol (tolerates Xopenex well)   Atorvastatin Other (See Comments)    Causes arthritis pain    Past Medical History, Surgical history, Social history, and Family History were reviewed and updated.  Review of Systems: Review of Systems  Constitutional:  Positive for malaise/fatigue.  HENT: Negative.    Eyes: Negative.   Respiratory:  Positive for cough and shortness of breath.   Cardiovascular:  Positive for chest pain.  Gastrointestinal: Negative.   Genitourinary: Negative.   Musculoskeletal:  Positive for joint pain.  Skin: Negative.   Neurological:  Positive for focal weakness.  Endo/Heme/Allergies: Negative.   Psychiatric/Behavioral: Negative.       Physical Exam:  Temperature is 98.2.  Pulse 72.  Blood pressure 108/78.  Weight is 258 pounds.  Wt Readings  from Last 3 Encounters:  11/12/22 258 lb (117 kg)  11/08/22 259 lb (117.5 kg)  09/01/22 262 lb (118.8 kg)    Physical Exam Vitals reviewed.  HENT:     Head: Normocephalic and atraumatic.  Eyes:     Pupils: Pupils are equal, round, and reactive to light.  Cardiovascular:     Rate and Rhythm: Normal rate and regular rhythm.     Heart sounds: Normal heart sounds.  Pulmonary:     Effort: Pulmonary effort is normal.     Breath sounds: Normal breath sounds.  Abdominal:     General: Bowel sounds are normal.     Palpations: Abdomen is soft.  Musculoskeletal:        General: No tenderness or deformity. Normal range of motion.     Cervical back: Normal range of motion.  Lymphadenopathy:     Cervical: No cervical adenopathy.  Skin:    General: Skin is warm and dry.     Findings: No erythema or rash.  Neurological:     Mental Status: He is alert and oriented to person, place, and time.  Psychiatric:        Behavior: Behavior normal.        Thought Content: Thought content normal.        Judgment: Judgment normal.    Lab Results  Component Value Date   WBC 4.6 11/12/2022   HGB 13.6 11/12/2022   HCT 41.1 11/12/2022   MCV 93.2 11/12/2022   PLT 182 11/12/2022   Lab Results  Component Value Date   FERRITIN 446 (H) 08/23/2021   IRON 189 (H) 08/26/2021   TIBC 213 (L) 08/26/2021   UIBC 24 08/26/2021   IRONPCTSAT 89 (H) 08/26/2021   Lab Results  Component Value Date   RETICCTPCT 1.9 08/23/2021   RBC 4.41 11/12/2022   No results found for: "KPAFRELGTCHN", "LAMBDASER", "KAPLAMBRATIO" No results found for: "IGGSERUM", "IGA", "IGMSERUM" No results found for: "TOTALPROTELP", "ALBUMINELP", "A1GS", "A2GS", "BETS", "BETA2SER", "GAMS", "MSPIKE", "SPEI"   Chemistry      Component Value Date/Time   NA 140 11/12/2022 0844   NA 141 03/01/2022 0749   K 4.5 11/12/2022 0844   CL 105 11/12/2022 0844   CO2 27 11/12/2022 0844   BUN 14 11/12/2022 0844   BUN 11 03/01/2022 0749    CREATININE 1.32 (H) 11/12/2022 0844      Component Value Date/Time   CALCIUM 8.8 (L) 11/12/2022 0844   ALKPHOS 60 11/12/2022 0844   AST 14 (L) 11/12/2022 0844   ALT 8 11/12/2022 0844   BILITOT 0.7 11/12/2022 0844       Impression and Plan: John [name] is a very pleasant 62  yo gentleman with metastatic squamous cell carcinoma of the right lung.  The tumor had high PD-L1 score.  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  Overall, his quality life is doing quite well right now.  I am happy about this.  I am glad that his heart is doing well.  Overall, he does not want to have any treatment.  I think this is certainly reasonable.  He just has a good quality of life.  He would like to have a good quality of life and keep it as long as possible.  I do not think we have to do any scans probably till the end of the year.  I will plan to get back to see him in another 6 weeks.  I will have to make sure that he gets his Rivka Barbara more see him back.  Again, I am happy that he is doing so well without any treatment.      Josph Macho, MD 9/27/20249:42 AM

## 2022-11-29 ENCOUNTER — Telehealth: Payer: Self-pay

## 2022-11-29 DIAGNOSIS — I4819 Other persistent atrial fibrillation: Secondary | ICD-10-CM

## 2022-11-29 NOTE — Telephone Encounter (Signed)
-----   Message from Armanda Magic sent at 11/25/2022  8:16 PM EDT ----- Abnormal PFTS - discussed with Dr. Delton Coombes who feels it is related to his primary lung issues and not amiodarone ----- Message ----- From: Leslye Peer, MD Sent: 11/25/2022   3:59 PM EDT To: Quintella Reichert, MD  I think this is likely due to his other underlying lung issues and treatment. ----- Message ----- From: Quintella Reichert, MD Sent: 11/12/2022   6:39 PM EDT To: Leslye Peer, MD  Looks like lung function has worsened.  He is on chronic amio for his Afib.  Do you think this could be Amio toxicity or is it related to his other lung issues.  Need some guidance. He thinks his SOB Is stable

## 2022-11-29 NOTE — Telephone Encounter (Signed)
Call to patient to discuss PFT results, patient states he is aware his lung issues are because of his lung cancer, he also states he has not been on amiodarone since July as it was discontinued by Eligha Bridegroom, NP. He denies any palpitations, chest pain or SOB. He is asking if he should restart amio.  Patient responses forwarded to Dr. Mayford Knife.

## 2022-12-01 NOTE — Telephone Encounter (Signed)
Call to patient, no phone # listed for messages on DPR. Left message asking recipient to call our office #.

## 2022-12-03 ENCOUNTER — Telehealth: Payer: Self-pay

## 2022-12-03 NOTE — Telephone Encounter (Signed)
Staff message received from [place], financial counselor:  Per Archie Patten, the case nurse [phone], ext. [phone]), the patient has been approved by the MD to receive John [name] [phone] at the facility due to his disease.

## 2022-12-04 ENCOUNTER — Other Ambulatory Visit: Payer: Self-pay | Admitting: Emergency Medicine

## 2022-12-04 ENCOUNTER — Other Ambulatory Visit: Payer: Self-pay | Admitting: Cardiology

## 2022-12-23 ENCOUNTER — Other Ambulatory Visit: Payer: Self-pay | Admitting: Cardiology

## 2022-12-23 DIAGNOSIS — E785 Hyperlipidemia, unspecified: Secondary | ICD-10-CM

## 2022-12-23 DIAGNOSIS — E782 Mixed hyperlipidemia: Secondary | ICD-10-CM

## 2022-12-31 ENCOUNTER — Other Ambulatory Visit (HOSPITAL_COMMUNITY): Payer: Self-pay

## 2022-12-31 NOTE — Telephone Encounter (Signed)
Call to patient to discuss order for zio to assess afib load. Unable to leave message on patient's #. Left brief message on spouse's Duard Brady) # listed in epic asking patient or spouse to call our office.

## 2023-01-10 ENCOUNTER — Other Ambulatory Visit: Payer: Self-pay

## 2023-01-10 ENCOUNTER — Inpatient Hospital Stay: Payer: Managed Care, Other (non HMO) | Attending: Internal Medicine

## 2023-01-10 ENCOUNTER — Inpatient Hospital Stay: Payer: Managed Care, Other (non HMO)

## 2023-01-10 ENCOUNTER — Encounter: Payer: Self-pay | Admitting: Hematology & Oncology

## 2023-01-10 ENCOUNTER — Ambulatory Visit: Payer: Managed Care, Other (non HMO) | Attending: Cardiology

## 2023-01-10 ENCOUNTER — Inpatient Hospital Stay (HOSPITAL_BASED_OUTPATIENT_CLINIC_OR_DEPARTMENT_OTHER): Payer: Managed Care, Other (non HMO) | Admitting: Hematology & Oncology

## 2023-01-10 VITALS — BP 102/66 | HR 71 | Temp 98.0°F | Resp 18 | Ht 72.0 in | Wt 255.0 lb

## 2023-01-10 DIAGNOSIS — C7970 Secondary malignant neoplasm of unspecified adrenal gland: Secondary | ICD-10-CM | POA: Diagnosis not present

## 2023-01-10 DIAGNOSIS — D702 Other drug-induced agranulocytosis: Secondary | ICD-10-CM

## 2023-01-10 DIAGNOSIS — C3411 Malignant neoplasm of upper lobe, right bronchus or lung: Secondary | ICD-10-CM | POA: Diagnosis present

## 2023-01-10 DIAGNOSIS — I4819 Other persistent atrial fibrillation: Secondary | ICD-10-CM

## 2023-01-10 DIAGNOSIS — C3491 Malignant neoplasm of unspecified part of right bronchus or lung: Secondary | ICD-10-CM

## 2023-01-10 LAB — CMP (CANCER CENTER ONLY)
ALT: 12 U/L (ref 0–44)
AST: 16 U/L (ref 15–41)
Albumin: 3.9 g/dL (ref 3.5–5.0)
Alkaline Phosphatase: 72 U/L (ref 38–126)
Anion gap: 6 (ref 5–15)
BUN: 15 mg/dL (ref 8–23)
CO2: 28 mmol/L (ref 22–32)
Calcium: 9.3 mg/dL (ref 8.9–10.3)
Chloride: 105 mmol/L (ref 98–111)
Creatinine: 1.35 mg/dL — ABNORMAL HIGH (ref 0.61–1.24)
GFR, Estimated: 60 mL/min — ABNORMAL LOW (ref >60)
Glucose, Bld: 107 mg/dL — ABNORMAL HIGH (ref 70–99)
Potassium: 4.2 mmol/L (ref 3.5–5.1)
Sodium: 139 mmol/L (ref 135–145)
Total Bilirubin: 0.8 mg/dL (ref <1.2)
Total Protein: 6.7 g/dL (ref 6.5–8.1)

## 2023-01-10 LAB — CBC WITH DIFFERENTIAL (CANCER CENTER ONLY)
Abs Immature Granulocytes: 0.01 10*3/uL (ref 0.00–0.07)
Basophils Absolute: 0 10*3/uL (ref 0.0–0.1)
Basophils Relative: 1 %
Eosinophils Absolute: 0.2 10*3/uL (ref 0.0–0.5)
Eosinophils Relative: 4 %
HCT: 41.2 % (ref 39.0–52.0)
Hemoglobin: 13.6 g/dL (ref 13.0–17.0)
Immature Granulocytes: 0 %
Lymphocytes Relative: 23 %
Lymphs Abs: 1 10*3/uL (ref 0.7–4.0)
MCH: 30 pg (ref 26.0–34.0)
MCHC: 33 g/dL (ref 30.0–36.0)
MCV: 90.9 fL (ref 80.0–100.0)
Monocytes Absolute: 0.6 10*3/uL (ref 0.1–1.0)
Monocytes Relative: 14 %
Neutro Abs: 2.6 10*3/uL (ref 1.7–7.7)
Neutrophils Relative %: 58 %
Platelet Count: 176 10*3/uL (ref 150–400)
RBC: 4.53 MIL/uL (ref 4.22–5.81)
RDW: 14.6 % (ref 11.5–15.5)
WBC Count: 4.4 10*3/uL (ref 4.0–10.5)
nRBC: 0 % (ref 0.0–0.2)

## 2023-01-10 LAB — LACTATE DEHYDROGENASE: LDH: 222 U/L — ABNORMAL HIGH (ref 98–192)

## 2023-01-10 MED ORDER — DENOSUMAB 120 MG/1.7ML ~~LOC~~ SOLN
120.0000 mg | Freq: Once | SUBCUTANEOUS | Status: AC
  Administered 2023-01-10: 120 mg via SUBCUTANEOUS
  Filled 2023-01-10: qty 1.7

## 2023-01-10 NOTE — Telephone Encounter (Signed)
Called patient regarding Dr. Norris Cross recommendation that he wear a 2 week home heart monitor to assess afib burden. Patient verbalizes understanding and agrees to plan as he currently is off amiodarone due to his terminal lung cancer. Orders placed, education provided regarding placement of heart monitor once it is mailed to patient's home. Instructions also sent over MyChart.

## 2023-01-10 NOTE — Progress Notes (Unsigned)
Enrolled for [employer] to mail a ZIO XT long term holter monitor to the patients address on file.  

## 2023-01-10 NOTE — Addendum Note (Signed)
Addended by: Luellen Pucker on: 01/10/2023 02:22 PM   Modules accepted: Orders

## 2023-01-10 NOTE — Progress Notes (Signed)
Per Dr. Myna Hidalgo  ok to treat with Rivka Barbara today with Cret at 1:35

## 2023-01-10 NOTE — Patient Instructions (Signed)
Denosumab Injection (Oncology) What is this medication? DENOSUMAB (den oh SUE mab) prevents weakened bones caused by cancer. It may also be used to treat noncancerous bone tumors that cannot be removed by surgery. It can also be used to treat high calcium levels in the blood caused by cancer. It works by blocking a protein that causes bones to break down quickly. This slows down the release of calcium from bones, which lowers calcium levels in your blood. It also makes your bones stronger and less likely to break (fracture). This medicine may be used for other purposes; ask your health care provider or pharmacist if you have questions. COMMON BRAND NAME(S): XGEVA What should I tell my care team before I take this medication? They need to know if you have any of these conditions: Dental disease Having surgery or tooth extraction Infection Kidney disease Low levels of calcium or vitamin D in the blood Malnutrition On hemodialysis Skin conditions or sensitivity Thyroid or parathyroid disease An unusual reaction to denosumab, other medications, foods, dyes, or preservatives Pregnant or trying to get pregnant Breast-feeding How should I use this medication? This medication is for injection under the skin. It is given by your care team in a hospital or clinic setting. A special MedGuide will be given to you before each treatment. Be sure to read this information carefully each time. Talk to your care team about the use of this medication in children. While it may be prescribed for children as young as 13 years for selected conditions, precautions do apply. Overdosage: If you think you have taken too much of this medicine contact a poison control center or emergency room at once. NOTE: This medicine is only for you. Do not share this medicine with others. What if I miss a dose? Keep appointments for follow-up doses. It is important not to miss your dose. Call your care team if you are unable to  keep an appointment. What may interact with this medication? Do not take this medication with any of the following: Other medications containing denosumab This medication may also interact with the following: Medications that lower your chance of fighting infection Steroid medications, such as prednisone or cortisone This list may not describe all possible interactions. Give your health care provider a list of all the medicines, herbs, non-prescription drugs, or dietary supplements you use. Also tell them if you smoke, drink alcohol, or use illegal drugs. Some items may interact with your medicine. What should I watch for while using this medication? Your condition will be monitored carefully while you are receiving this medication. You may need blood work while taking this medication. This medication may increase your risk of getting an infection. Call your care team for advice if you get a fever, chills, sore throat, or other symptoms of a cold or flu. Do not treat yourself. Try to avoid being around people who are sick. You should make sure you get enough calcium and vitamin D while you are taking this medication, unless your care team tells you not to. Discuss the foods you eat and the vitamins you take with your care team. Some people who take this medication have severe bone, joint, or muscle pain. This medication may also increase your risk for jaw problems or a broken thigh bone. Tell your care team right away if you have severe pain in your jaw, bones, joints, or muscles. Tell your care team if you have any pain that does not go away or that gets worse. Talk   to your care team if you may be pregnant. Serious birth defects can occur if you take this medication during pregnancy and for 5 months after the last dose. You will need a negative pregnancy test before starting this medication. Contraception is recommended while taking this medication and for 5 months after the last dose. Your care team  can help you find the option that works for you. What side effects may I notice from receiving this medication? Side effects that you should report to your care team as soon as possible: Allergic reactions--skin rash, itching, hives, swelling of the face, lips, tongue, or throat Bone, joint, or muscle pain Low calcium level--muscle pain or cramps, confusion, tingling, or numbness in the hands or feet Osteonecrosis of the jaw--pain, swelling, or redness in the mouth, numbness of the jaw, poor healing after dental work, unusual discharge from the mouth, visible bones in the mouth Side effects that usually do not require medical attention (report to your care team if they continue or are bothersome): Cough Diarrhea Fatigue Headache Nausea This list may not describe all possible side effects. Call your doctor for medical advice about side effects. You may report side effects to FDA at 1-800-FDA-1088. Where should I keep my medication? This medication is given in a hospital or clinic. It will not be stored at home. NOTE: This sheet is a summary. It may not cover all possible information. If you have questions about this medicine, talk to your doctor, pharmacist, or health care provider.  2024 Elsevier/Gold Standard (2021-06-24 00:00:00)  

## 2023-01-10 NOTE — Progress Notes (Signed)
[name] + Hematology and Oncology Follow Up Visit  John [name] [id number] 1961/01/03 62 y.o. 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 3 months --next dose in 03/2023    Interim History:  John [name] is here today for follow-up.  He seems to be managing fairly well.  His weight is still up there but he is lost a little bit of weight.  He really has not had any problems with shortness of breath.  His atrial fibrillation seems to be back in rhythm now.  I think he is off amiodarone.  He has had no change in bowel or bladder habits.  He is still trying to work.  He has had a very good appetite.  Sure that the steroids that he was taking for his back and lungs were helping.  He has had no bleeding.  He has had a little bit of leg swelling, which I suspect was probably from past steroid use.  Currently, I would say that his performance status is probably ECOG 1.    Medications:  Allergies as of 01/10/2023       Reactions   Albuterol Other (See Comments)   Patient has had episode of atrial fib following administration of albuterol (tolerates Xopenex well)   Atorvastatin Other (See Comments)   Causes arthritis pain        Medication List        Accurate as of January 10, 2023  9:04 AM. If you have any questions, ask your nurse or doctor.          diltiazem 30 MG tablet Commonly known as: Cardizem Take 1 tablet by mouth as need for palpitations or Afib up to 4 times a day   Eliquis 5 MG Tabs tablet Generic drug: apixaban TAKE 1 TABLET(5 MG) BY MOUTH TWICE DAILY   metoprolol  succinate 25 MG 24 hr tablet Commonly known as: TOPROL-XL TAKE 1 TABLET(25 MG) BY MOUTH DAILY   omeprazole 20 MG capsule Commonly known as: PRILOSEC Take 1 capsule (20 mg total) by mouth daily.   PARoxetine 10 MG tablet Commonly known as: PAXIL Take 1 tablet (10 mg total) by mouth at bedtime.   predniSONE 5 MG tablet Commonly known as: DELTASONE Take 1 tablet (5 mg total) by mouth daily with breakfast.   Repatha SureClick 140 MG/ML Soaj Generic drug: Evolocumab ADMINISTER 1 ML UNDER THE SKIN EVERY 14 DAYS   Trelegy Ellipta 100-62.5-25 MCG/ACT Aepb Generic drug: Fluticasone-Umeclidin-Vilant INHALE 1 PUFF INTO THE LUNGS DAILY        Allergies:  Allergies  Allergen Reactions   Albuterol Other (See Comments)    Patient has had episode of atrial fib following administration of albuterol (tolerates Xopenex well)   Atorvastatin Other (See Comments)    Causes arthritis pain    Past Medical History, Surgical history, Social history, and Family History were reviewed and updated.  Review of Systems: Review of Systems  Constitutional:  Positive for malaise/fatigue.  HENT: Negative.    Eyes: Negative.   Respiratory:  Positive for cough and shortness of  breath.   Cardiovascular:  Positive for chest pain.  Gastrointestinal: Negative.   Genitourinary: Negative.   Musculoskeletal:  Positive for joint pain.  Skin: Negative.   Neurological:  Positive for focal weakness.  Endo/Heme/Allergies: Negative.   Psychiatric/Behavioral: Negative.       Physical Exam:  Temperature is 98.  Pulse 71.  Blood pressure 102/66.  Weight is 255 pounds.   Wt Readings from Last 3 Encounters:  01/10/23 255 lb (115.7 kg)  11/12/22 258 lb (117 kg)  11/08/22 259 lb (117.5 kg)    Physical Exam Vitals reviewed.  HENT:     Head: Normocephalic and atraumatic.  Eyes:     Pupils: Pupils are equal, round, and reactive to light.  Cardiovascular:     Rate and Rhythm: Normal rate and regular  rhythm.     Heart sounds: Normal heart sounds.  Pulmonary:     Effort: Pulmonary effort is normal.     Breath sounds: Normal breath sounds.  Abdominal:     General: Bowel sounds are normal.     Palpations: Abdomen is soft.  Musculoskeletal:        General: No tenderness or deformity. Normal range of motion.     Cervical back: Normal range of motion.  Lymphadenopathy:     Cervical: No cervical adenopathy.  Skin:    General: Skin is warm and dry.     Findings: No erythema or rash.  Neurological:     Mental Status: He is alert and oriented to person, place, and time.  Psychiatric:        Behavior: Behavior normal.        Thought Content: Thought content normal.        Judgment: Judgment normal.    Lab Results  Component Value Date   WBC 4.4 01/10/2023   HGB 13.6 01/10/2023   HCT 41.2 01/10/2023   MCV 90.9 01/10/2023   PLT 176 01/10/2023   Lab Results  Component Value Date   FERRITIN 446 (H) 08/23/2021   IRON 189 (H) 08/26/2021   TIBC 213 (L) 08/26/2021   UIBC 24 08/26/2021   IRONPCTSAT 89 (H) 08/26/2021   Lab Results  Component Value Date   RETICCTPCT 1.9 08/23/2021   RBC 4.53 01/10/2023   No results found for: "KPAFRELGTCHN", "LAMBDASER", "KAPLAMBRATIO" No results found for: "IGGSERUM", "IGA", "IGMSERUM" No results found for: "TOTALPROTELP", "ALBUMINELP", "A1GS", "A2GS", "BETS", "BETA2SER", "GAMS", "MSPIKE", "SPEI"   Chemistry      Component Value Date/Time   NA 140 11/12/2022 0844   NA 141 03/01/2022 0749   K 4.5 11/12/2022 0844   CL 105 11/12/2022 0844   CO2 27 11/12/2022 0844   BUN 14 11/12/2022 0844   BUN 11 03/01/2022 0749   CREATININE 1.32 (H) 11/12/2022 0844      Component Value Date/Time   CALCIUM 8.8 (L) 11/12/2022 0844   ALKPHOS 60 11/12/2022 0844   AST 14 (L) 11/12/2022 0844   ALT 8 11/12/2022 0844   BILITOT 0.7 11/12/2022 0844       Impression and Plan: John [name] is a very pleasant 62 yo gentleman with metastatic squamous cell carcinoma  of the right lung.  The tumor had high PD-L1 score.  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  Overall, his quality life is doing quite well right now.  He really has done nicely.  We he has been off therapy now for quite a while.  We will have to  go ahead and get him set up with another scan.  Will do a PET scan on him.  We will see about doing the PET scan at the end of the year.  Hopefully, we will not see tumor progression that will need to be treated.  I know that he has had a hard time with treatment.  Again this is all about quality of life.  I will plan to see him back after the holidays are over.    Josph Macho, MD 11/25/20249:04 AM

## 2023-01-11 MED ORDER — PAROXETINE HCL 10 MG PO TABS: 10.0000 mg | ORAL_TABLET | Freq: Every day | ORAL | 2 refills | Status: DC

## 2023-01-17 DIAGNOSIS — I4819 Other persistent atrial fibrillation: Secondary | ICD-10-CM | POA: Diagnosis not present

## 2023-02-11 ENCOUNTER — Encounter (HOSPITAL_COMMUNITY)
Admission: RE | Admit: 2023-02-11 | Discharge: 2023-02-11 | Disposition: A | Discharge status: Home / Self Care | Payer: Managed Care, Other (non HMO) | Source: Ambulatory Visit | Attending: Hematology & Oncology | Admitting: Hematology & Oncology

## 2023-02-11 DIAGNOSIS — C3491 Malignant neoplasm of unspecified part of right bronchus or lung: Secondary | ICD-10-CM | POA: Insufficient documentation

## 2023-02-11 LAB — GLUCOSE, CAPILLARY: Glucose-Capillary: 96 mg/dL (ref 70–99)

## 2023-02-11 MED ORDER — FLUDEOXYGLUCOSE F - 18 (FDG) INJECTION
12.7500 | Freq: Once | INTRAVENOUS | Status: AC
  Administered 2023-02-11: 12.76 via INTRAVENOUS

## 2023-02-15 ENCOUNTER — Other Ambulatory Visit (HOSPITAL_COMMUNITY): Payer: Self-pay

## 2023-02-15 ENCOUNTER — Telehealth: Payer: Self-pay | Admitting: Pharmacy Technician

## 2023-02-15 NOTE — Telephone Encounter (Signed)
 Pharmacy Patient Advocate Encounter   Received notification from CoverMyMeds that prior authorization for repatha  is required/requested.   Insurance verification completed.   The patient is insured through [employer] .   Per test claim: PA required; PA submitted to above mentioned insurance via CoverMyMeds Key/confirmation #/EOC [id number] Status is pending

## 2023-02-15 NOTE — Telephone Encounter (Signed)
 Pharmacy Patient Advocate Encounter  Received notification from [employer] that Prior Authorization for repatha  has been APPROVED from 02/15/23 to 02/15/24. Ran test claim, Copay is $0.00 3 months. This test claim was processed through [facility]- copay amounts may vary at other pharmacies due to pharmacy/plan contracts, or as the patient moves through the different stages of their insurance plan.   PA #/Case ID/Reference #: [id number]

## 2023-02-21 ENCOUNTER — Other Ambulatory Visit: Payer: Self-pay

## 2023-02-21 ENCOUNTER — Inpatient Hospital Stay: Payer: Managed Care, Other (non HMO) | Attending: Internal Medicine

## 2023-02-21 ENCOUNTER — Inpatient Hospital Stay: Payer: Managed Care, Other (non HMO)

## 2023-02-21 ENCOUNTER — Inpatient Hospital Stay (HOSPITAL_BASED_OUTPATIENT_CLINIC_OR_DEPARTMENT_OTHER): Payer: Managed Care, Other (non HMO) | Admitting: Family

## 2023-02-21 VITALS — BP 107/76 | HR 94 | Temp 98.0°F | Resp 16 | Wt 253.0 lb

## 2023-02-21 DIAGNOSIS — C3491 Malignant neoplasm of unspecified part of right bronchus or lung: Secondary | ICD-10-CM

## 2023-02-21 DIAGNOSIS — C3411 Malignant neoplasm of upper lobe, right bronchus or lung: Secondary | ICD-10-CM | POA: Insufficient documentation

## 2023-02-21 DIAGNOSIS — R0602 Shortness of breath: Secondary | ICD-10-CM | POA: Insufficient documentation

## 2023-02-21 DIAGNOSIS — C7971 Secondary malignant neoplasm of right adrenal gland: Secondary | ICD-10-CM | POA: Insufficient documentation

## 2023-02-21 DIAGNOSIS — R059 Cough, unspecified: Secondary | ICD-10-CM | POA: Insufficient documentation

## 2023-02-21 DIAGNOSIS — C779 Secondary and unspecified malignant neoplasm of lymph node, unspecified: Secondary | ICD-10-CM | POA: Insufficient documentation

## 2023-02-21 LAB — CBC WITH DIFFERENTIAL (CANCER CENTER ONLY)
Abs Immature Granulocytes: 0.02 10*3/uL (ref 0.00–0.07)
Basophils Absolute: 0 10*3/uL (ref 0.0–0.1)
Basophils Relative: 0 %
Eosinophils Absolute: 0.2 10*3/uL (ref 0.0–0.5)
Eosinophils Relative: 5 %
HCT: 39 % (ref 39.0–52.0)
Hemoglobin: 12.9 g/dL — ABNORMAL LOW (ref 13.0–17.0)
Immature Granulocytes: 0 %
Lymphocytes Relative: 18 %
Lymphs Abs: 0.9 10*3/uL (ref 0.7–4.0)
MCH: 29.8 pg (ref 26.0–34.0)
MCHC: 33.1 g/dL (ref 30.0–36.0)
MCV: 90.1 fL (ref 80.0–100.0)
Monocytes Absolute: 0.7 10*3/uL (ref 0.1–1.0)
Monocytes Relative: 15 %
Neutro Abs: 2.9 10*3/uL (ref 1.7–7.7)
Neutrophils Relative %: 62 %
Platelet Count: 195 10*3/uL (ref 150–400)
RBC: 4.33 MIL/uL (ref 4.22–5.81)
RDW: 14.6 % (ref 11.5–15.5)
WBC Count: 4.8 10*3/uL (ref 4.0–10.5)
nRBC: 0 % (ref 0.0–0.2)

## 2023-02-21 LAB — CMP (CANCER CENTER ONLY)
ALT: 10 U/L (ref 0–44)
AST: 14 U/L — ABNORMAL LOW (ref 15–41)
Albumin: 4 g/dL (ref 3.5–5.0)
Alkaline Phosphatase: 68 U/L (ref 38–126)
Anion gap: 7 (ref 5–15)
BUN: 12 mg/dL (ref 8–23)
CO2: 25 mmol/L (ref 22–32)
Calcium: 9 mg/dL (ref 8.9–10.3)
Chloride: 107 mmol/L (ref 98–111)
Creatinine: 1.14 mg/dL (ref 0.61–1.24)
GFR, Estimated: >60 mL/min (ref >60)
Glucose, Bld: 83 mg/dL (ref 70–99)
Potassium: 4.4 mmol/L (ref 3.5–5.1)
Sodium: 139 mmol/L (ref 135–145)
Total Bilirubin: 0.8 mg/dL (ref 0.0–1.2)
Total Protein: 7 g/dL (ref 6.5–8.1)

## 2023-02-21 NOTE — Progress Notes (Signed)
 Hematology and Oncology Follow Up Visit  John [name] [id number] Oct 22, 1960 63 y.o. 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 3 months --next dose in 03/2023     Interim History:  John [name] is here today for follow-up. He has felt anxious while awaiting his PET scan results. It was completed 9 days ago but has not ben read. RN called imaging and he is now on the STAT read list.  His chronic dry cough and SOB with over exertion are unchanged from baseline.  No issue with pain.  No fever, chills, n/v, rash, dizziness, chest pain, palpitations, abdominal pain or changes in bowel or bladder habits.  No blood loss noted. No bruising or petechiae.  No swelling in his extremities at this time.  No falls or syncope.  Appetite and hydration are good. He has cut out sugary drinks and his weight is down several lbs since his last visit at 253 lbs.   ECOG Performance Status: 1 - Symptomatic but completely ambulatory  Medications:  Allergies as of 02/21/2023       Reactions   Albuterol  Other (See Comments)   Patient has had episode of atrial fib following administration of albuterol  (tolerates Xopenex  well)   Atorvastatin  Other (See Comments)   Causes arthritis pain        Medication List        Accurate as of February 21, 2023  1:18 PM. If you have any questions, ask your nurse or doctor.          diltiazem  30 MG tablet Commonly known as: Cardizem  Take 1 tablet by mouth as need for palpitations or Afib up to 4 times a day   Eliquis  5 MG Tabs tablet Generic drug: apixaban  TAKE 1 TABLET(5 MG) BY MOUTH  TWICE DAILY   metoprolol  succinate 25 MG 24 hr tablet Commonly known as: TOPROL -XL TAKE 1 TABLET(25 MG) BY MOUTH DAILY   omeprazole  20 MG capsule Commonly known as: PRILOSEC  Take 1 capsule (20 mg total) by mouth daily.   PARoxetine  10 MG tablet Commonly known as: PAXIL  Take 1 tablet (10 mg total) by mouth at bedtime.   predniSONE  5 MG tablet Commonly known as: DELTASONE  Take 1 tablet (5 mg total) by mouth daily with breakfast.   Repatha  SureClick 140 MG/ML Soaj Generic drug: Evolocumab  ADMINISTER 1 ML UNDER THE SKIN EVERY 14 DAYS   Trelegy Ellipta  100-62.5-25 MCG/ACT Aepb Generic drug: Fluticasone -Umeclidin-Vilant INHALE 1 PUFF INTO THE LUNGS DAILY        Allergies:  Allergies  Allergen Reactions   Albuterol  Other (See Comments)    Patient has had episode of atrial fib following administration of albuterol  (tolerates Xopenex  well)   Atorvastatin  Other (See Comments)    Causes arthritis pain    Past Medical History, Surgical history, Social history, and Family History were reviewed and updated.  Review of Systems: All other 10 point review of systems is negative.   Physical Exam:  weight is 253 lb (114.8 kg). His oral temperature is 98 F (36.7 C). His blood pressure is 107/76 and  his pulse is 94. His respiration is 16 and oxygen saturation is 94%.   Wt Readings from Last 3 Encounters:  02/21/23 253 lb (114.8 kg)  01/10/23 255 lb (115.7 kg)  11/12/22 258 lb (117 kg)    Ocular: Sclerae unicteric, pupils equal, round and reactive to light Ear-nose-throat: Oropharynx clear, dentition fair Lymphatic: No cervical or supraclavicular adenopathy Lungs no rales or rhonchi, good excursion bilaterally Heart regular rate and rhythm, no murmur appreciated Abd soft, nontender, positive bowel sounds MSK no focal spinal tenderness, no joint edema Neuro: non-focal, well-oriented, appropriate affect Breasts: Deferred  Lab Results  Component Value Date   WBC 4.8  02/21/2023   HGB 12.9 (L) 02/21/2023   HCT 39.0 02/21/2023   MCV 90.1 02/21/2023   PLT 195 02/21/2023   Lab Results  Component Value Date   FERRITIN 446 (H) 08/23/2021   IRON 189 (H) 08/26/2021   TIBC 213 (L) 08/26/2021   UIBC 24 08/26/2021   IRONPCTSAT 89 (H) 08/26/2021   Lab Results  Component Value Date   RETICCTPCT 1.9 08/23/2021   RBC 4.33 02/21/2023   No results found for: KPAFRELGTCHN, LAMBDASER, KAPLAMBRATIO No results found for: IGGSERUM, IGA, IGMSERUM No results found for: STEPHANY CARLOTA BENSON MARKEL EARLA JOANNIE DOC VICK, SPEI   Chemistry      Component Value Date/Time   NA 139 01/10/2023 0822   NA 141 03/01/2022 0749   K 4.2 01/10/2023 0822   CL 105 01/10/2023 0822   CO2 28 01/10/2023 0822   BUN 15 01/10/2023 0822   BUN 11 03/01/2022 0749   CREATININE 1.35 (H) 01/10/2023 0822      Component Value Date/Time   CALCIUM  9.3 01/10/2023 0822   ALKPHOS 72 01/10/2023 0822   AST 16 01/10/2023 0822   ALT 12 01/10/2023 0822   BILITOT 0.8 01/10/2023 0822       Impression and Plan: John [name] is a very pleasant 63 yo gentleman with metastatic squamous cell carcinoma of the right lung, tumor had high PD-L1 score.  Update: PET results called to Dr. Timmy and he has spoken with Dr. Shannon. Dr. Shannon with reviewed scans and see if patient is a candidate for radiation therapy to the right adrenal gland metastasis.  Follow-up in 4 weeks per MD.    Lauraine Pepper, NP 1/6/20251:18 PM

## 2023-02-23 ENCOUNTER — Encounter: Payer: Self-pay | Admitting: Internal Medicine

## 2023-02-23 ENCOUNTER — Telehealth: Payer: Self-pay | Admitting: *Deleted

## 2023-02-23 NOTE — Telephone Encounter (Signed)
 Patient called with questions about his upcoming schedule.  Clarification given.  Also asked if Dr Timmy had talked with Dr Shannon.  Dr Timmy has talked with Dr Shannon and he is to be getting back to him on whether radiation is an option.  Patient notified of this.

## 2023-02-24 ENCOUNTER — Telehealth: Payer: Self-pay | Admitting: Adult Health

## 2023-02-24 MED ORDER — PREDNISONE 10 MG PO TABS
ORAL_TABLET | ORAL | 0 refills | Status: DC
Start: 2023-02-24 — End: 2023-03-23

## 2023-02-24 MED ORDER — AMOXICILLIN-POT CLAVULANATE 875-125 MG PO TABS
1.0000 | ORAL_TABLET | Freq: Two times a day (BID) | ORAL | 0 refills | Status: DC
Start: 2023-02-24 — End: 2023-03-23

## 2023-02-24 NOTE — Telephone Encounter (Signed)
 Complains of 2 weeks of increased cough, congestion with thick brown mucus, increased shortness of breath. Patient is recommended to continue on Trelegy.  Remains on prednisone  5 mg daily. Office visit set up for February 25, 2023 at 930.  Please contact office for sooner follow up if symptoms do not improve or worsen or seek emergency care

## 2023-02-25 ENCOUNTER — Telehealth (INDEPENDENT_AMBULATORY_CARE_PROVIDER_SITE_OTHER): Payer: Managed Care, Other (non HMO) | Admitting: Adult Health

## 2023-02-25 ENCOUNTER — Other Ambulatory Visit (HOSPITAL_COMMUNITY): Payer: Self-pay

## 2023-02-25 ENCOUNTER — Telehealth: Payer: Self-pay

## 2023-02-25 ENCOUNTER — Encounter: Payer: Self-pay | Admitting: Adult Health

## 2023-02-25 DIAGNOSIS — J4489 Other specified chronic obstructive pulmonary disease: Secondary | ICD-10-CM | POA: Diagnosis not present

## 2023-02-25 DIAGNOSIS — C3491 Malignant neoplasm of unspecified part of right bronchus or lung: Secondary | ICD-10-CM | POA: Diagnosis not present

## 2023-02-25 DIAGNOSIS — J9611 Chronic respiratory failure with hypoxia: Secondary | ICD-10-CM | POA: Diagnosis not present

## 2023-02-25 MED ORDER — BENZONATATE 200 MG PO CAPS: 200.0000 mg | ORAL_CAPSULE | Freq: Three times a day (TID) | ORAL | 3 refills | Status: DC | PRN

## 2023-02-25 MED ORDER — HYDROCODONE BIT-HOMATROP MBR 5-1.5 MG/5ML PO SOLN: 5.0000 mL | Freq: Four times a day (QID) | ORAL | 0 refills | Status: DC | PRN

## 2023-02-25 NOTE — Patient Instructions (Signed)
 Finish Augmentin  and prednisone  as directed. Begin liquid Mucinex twice daily as needed for cough and congestion Tessalon  Perles 3 times daily as needed for cough Hydromet 1 teaspoon every 6 hours as needed for severe cough.  Use with caution as this causes sedation and balance issues. Continue on Oxygen 2l/m with activity and At bedtime   , goal is keep O2 sats >88-90%.  Follow up with Oncology as planned.  Follow up with Dr. Shelah  in 4-6  weeks and As needed   Please contact office for sooner follow up if symptoms do not improve or worsen or seek emergency care

## 2023-02-25 NOTE — Telephone Encounter (Signed)
 Call to patient to advise that Dr. Shlomo reviewed his heart monitor and there was no afib, just extra beats from top and bottom of his heart which are benign. Patient verbalizes understanding and states that he has not been on amiodarone  recently.   He also states that he is currently being treated for stage IV lung cancer and is now trying to find out if he is a candidate for radiation therapy to the right adrenal gland metastasis. He states he is relieved that afib is not a problem for him currently. Patient responses forwarded to Dr. Shlomo.

## 2023-02-25 NOTE — Progress Notes (Signed)
 Virtual Visit via Video Note  I connected with John [name] on 02/25/23 at  9:30 AM EST by a video enabled telemedicine application and verified that I am speaking with the correct person using two identifiers.  Location: Patient: Home  Provider: Office    I discussed the limitations of evaluation and management by telemedicine and the availability of in person appointments. The patient expressed understanding and agreed to proceed.  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  History of pneumonitis secondary to Keytruda  requiring prolonged steroid course-baseline dose prednisone  5 mg daily . Chronic respiratory failure on oxygen with activity and at bedtime.  History of recurrent right-sided pneumothorax requiring prolonged hospitalization Medical history significant for atrial fibrillation on Eliquis  and rheumatoid arthritis  Today's video visit is an acute office visit.  Patient complains of 4 week of cough and congestion with thick brown mucus . Difficult to cough up mucus at times .  Patient remains on Trelegy inhaler daily.  Does not use albuterol  due to tachycardia side effect.  Patient denies any hemoptysis, fever, chest pain.  Was called in Augmentin  for 10 days and a prednisone  burst yesterday.  Patient says he has started this.  Patient continues to work from home. Appetite is fair. Followed by oncology for stage IVa squamous cell lung cancer involving the right lung.  Currently on Xgeva  every 3 months . PET scan as below.  Patient is being considered for possible SBRT for new adrenal findings. He remains on oxygen 2 to 3 L with activity and at bedtime.  Patient says over the last week or so he has not worn it at bedtime.  Occasionally puts it on during the daytime.  We discussed potential complications of hypoxemia.   Says occasionally his O2 saturations drop into the upper 80s with activity.  Currently not using any OTC cough medications.  Has had some Hydromet that helps with his cough but has ran out of the cough syrup prescription  Past Medical History:  Diagnosis Date   Arthritis    Rheumatoid   Bacteremia due to Pseudomonas    C. difficile colitis    Chronic anxiety 11/08/2014   Cough 09/22/2021   COVID 2020   mild case   Depression    Drug-induced neutropenia (HCC) 08/11/2020   Family history of adverse reaction to anesthesia    brother with seizures had episode under anesthesia.  Patient has seizures as well.   GERD (gastroesophageal reflux disease)    History of radiation therapy 04/24/2020-05/16/2020   IMRT to right lung     Dr Lynwood Nasuti   History of radiation therapy    08/10/21-08/19/21-Dr. Lynwood Nasuti   Neutropenia [facility]) 05/12/2020   Neutropenic fever (HCC) 05/12/2020   Normocytic anemia 04/18/2021   PAF (paroxysmal atrial fibrillation) (HCC)    CHADS2VSAC score 3   Rheumatoid aortitis    Secondary hypercoagulable state (HCC) 10/28/2020   Sepsis (HCC) 10/06/2020   Shortness of breath 12/18/2020   Squamous cell lung cancer (HCC)    Current Outpatient Medications on File Prior to Visit  Medication Sig Dispense Refill   Evolocumab  (REPATHA  SURECLICK) 140 MG/ML SOAJ ADMINISTER 1 ML UNDER THE SKIN EVERY 14 DAYS 6 mL 1   amoxicillin -clavulanate (AUGMENTIN ) 875-125 MG tablet Take 1 tablet by mouth 2 (two) times daily. 20 tablet 0   diltiazem  (CARDIZEM ) 30  MG tablet Take 1 tablet by mouth as need for palpitations or Afib up to 4 times a day 30 tablet 6   ELIQUIS  5 MG TABS tablet TAKE 1 TABLET(5 MG) BY MOUTH TWICE DAILY 60 tablet 5   metoprolol  succinate (TOPROL -XL) 25 MG 24 hr tablet TAKE 1 TABLET(25 MG) BY MOUTH DAILY 90 tablet 1   omeprazole  (PRILOSEC ) 20 MG capsule Take 1 capsule (20 mg total) by mouth daily. 30 capsule 2   PARoxetine  (PAXIL ) 10 MG tablet Take 1 tablet (10 mg  total) by mouth at bedtime. 90 tablet 2   predniSONE  (DELTASONE ) 10 MG tablet 4 tabs for 2 days, then 3 tabs for 2 days, 2 tabs for 2 days, then 1 tab for 2 days, then back to 5mg  daily 20 tablet 0   predniSONE  (DELTASONE ) 5 MG tablet Take 1 tablet (5 mg total) by mouth daily with breakfast. 90 tablet 4   TRELEGY ELLIPTA  100-62.5-25 MCG/ACT AEPB INHALE 1 PUFF INTO THE LUNGS DAILY 60 each 5   [DISCONTINUED] prochlorperazine  (COMPAZINE ) 10 MG tablet Take 1 tablet (10 mg total) by mouth every 6 (six) hours as needed (Nausea or vomiting). (Patient not taking: Reported on 06/29/2021) 30 tablet 1   No current facility-administered medications on file prior to visit.       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   Assessment and Plan: Acute COPD exacerbation-recent PET scan showed stable right-sided consolidation and cavitary process.  Will treat with a 10-day course of Augmentin .  Prednisone  taper.  Patient is to take as directed. Add in liquid Mucinex DM twice daily.  Tessalon  Perles for cough control.  May use Hydromet for severe cough.  Advised of sedating effect-and to use with caution Continue on Trelegy inhaler. Follow-up in 6 weeks or sooner as needed.  Chronic respiratory failure continue on oxygen to maintain O2 saturations greater than 88 to 90%.  Stage IV metastatic lung cancer-continue follow-up with oncology.  Plan  Patient Instructions  Finish Augmentin  and prednisone  as directed. Begin liquid Mucinex twice daily as needed for cough and congestion Tessalon  Perles 3 times daily as needed for cough Hydromet 1 teaspoon every 6 hours as needed for severe cough.  Use with caution  as this causes sedation and balance issues. Continue on Oxygen 2l/m with activity and At bedtime   , goal is keep O2 sats >88-90%.  Follow up with Oncology as planned.  Follow up with Dr. Shelah  in 4-6  weeks and As needed   Please contact office for sooner follow up if symptoms do not improve or worsen or seek emergency care      Follow Up Instructions: Follow-up in 4 to 6 weeks and as needed   I discussed the assessment and treatment plan with the patient. The patient was provided an opportunity to ask questions and all were answered. The patient agreed with the plan and demonstrated an understanding of the instructions.   The patient was advised to call back or seek an in-person evaluation if the symptoms worsen or if the condition fails to improve as anticipated.  I provided 30  minutes of non-face-to-face time during this encounter.   Madelin Stank, NP

## 2023-02-25 NOTE — Telephone Encounter (Signed)
-----   Message from Armanda Magic sent at 02/20/2023  8:57 PM EST ----- No afib on heart monitor just extra beats from top and bottom of heart which are benign.  Please find out if he went back on his Amio

## 2023-03-01 NOTE — Progress Notes (Addendum)
 GU Location of Tumor / Histology: Right Adrenal Gland   John [name] presents with signs/symptoms of:   Biopsies of  (if applicable) revealed:   Past/Anticipated interventions by urology, if any: N/a  Past/Anticipated interventions by medical oncology, if any: yes We will have to go ahead and get John [name] set up with another scan. Will do a PET scan on John [name]. We will see about doing the PET scan at the end of the year.  I will plan to see John [name] back after the holidays are over.     John Mars, MD 11/25/20249:04 AM  Weight readings from last 3 encounters, if any:  Last three weights: 03/02/2023- 256.0 lbs 02/21/2023 -253.0 lbs 01/10/2023 -255.0 lbs Bowel/Bladder complaints, if any: Denies  Nausea/Vomiting, if any: Denies  Pain issues, if any:  Denies  SAFETY ISSUES: Prior radiation? Yes Pacemaker/ICD? No Possible current pregnancy? No Is the patient on methotrexate? No  Current Complaints / other details: None at this time.  BP 122/81 (BP Location: Right Arm, Patient Position: Sitting, Cuff Size: Large)   Pulse 88   Temp 97.7 F (36.5 C)   Resp 20   Ht 6' (1.829 m)   Wt 256 lb (116.1 kg)   SpO2 92%   BMI 34.72 kg/m

## 2023-03-01 NOTE — Progress Notes (Signed)
 Radiation Oncology         [phone] ________________________________  Initial out patient Re-consultation  Name: John [name] MRN: [id number]  Date: 03/02/2023  DOB: 1960/12/22  WU:[id number], Bambi Lever, MD  Ivor Mars, MD   REFERRING PHYSICIAN: Ivor Mars, MD  DIAGNOSIS:  The primary encounter diagnosis is Stage 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 (HCC). 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    Cancer Staging  Stage 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 (HCC) Staging form: Lung, AJCC 8th Edition - Clinical: Stage IVB (cT4, cN2, cM1c) - Signed by Marlene Simas, MD on 04/10/2020    HISTORY OF PRESENT ILLNESS::John [name] is a 63 y.o. male who is seen as a courtesy of Dr. Gray Layman for an opinion concerning radiation therapy as part of management for his recently diagnosed radiation therapy to the right adrenal gland metastasis. He was last seen in office on 03/22/22 for a re-consultation visit. The visit determined that he will follow up as needed in radiation therapy and be closely monitored by Dr. Maria Shiner and receive Xgeva  injections.   Since his last office visit, the patient presented to Dr. Eulas Hick on 04/09/22 with a  chronic cough, lose of appetite, he had lost 6 pounds. He was also experiencing symptoms of increased heart rate and diaphoresis.  A DG Chest done on the same day shows stable post treatment changes in right hemithorax. No acute findings were indicated.   He later presented for a routine follow-up visit with Dr. Maria Shiner on 04/29/22. At that time, he was experiencing a cough and mild pain over his right flank. A previous chest x-ray done few weeks prior was reviewed and showed no signs of pneumonia.  He returned for a follow up on 05/31/22 with Dr. Sueanne Emerald. At that time,  he continued to complain of a chronic cough. His constant coughing led to feel dizzy. A DG Chest done on the same day redemonstrated partly cavitary airspace opacity in the right mid-lung, which now demonstrates an air-fluid level. He was then recommended to use his supplemental oxygen on a regular basis and was treated with antibiotics.   A later follow-up visit on 06/10/22 with Dr. Maria Shiner, he indicated that insurance did not cover a PET scan and Xgeva  was very expensive and was not continuing with it. A few days later on 06/14/22 he was seen by Dr. Arthur Lash at [facility] for continuing symptoms of cough and shortness of breath and Afib. He was then seen by Dr. Racheal Buddle on 06/30/22 to manage his COPD and asthma. He reported some right upper chest and muscle pain but stated that his cough was improving.   He later presented for a routine follow-up visit with Dr. Maria Shiner on 07/07/22. At that time a restaging PET scan done on 07/02/22 was reviewed and showed an area above moderate hypermetabolism in the right lower lung. There was no pulmonary nodules or mediastinal/hilar lymph nodes. He had previous resolution of right adrenal gland. Resolution of hypermetabolic T5 lesion. He has some new areas of metabolism in the right second and third ribs which appear to be small fractures. No other complains were indicated during the visit. He presented for another follow up visit on 08/18/22 with no complaints or symptoms indicated at that time. He was gaining weight and did  not have the need to use his oxygen. A later visit on 11/12/23 where a restaging PET scan done on 10/08/22 showed no evidence of disease progression although there was increase in hypermetabolism and a right adrenal lesion measuring 3 cm. It had not increased in size.   Most recently, the patient underwent a restaging PET scan on 02/11/23 which showed interval enlargement of  a new PET postiive soft tissue nodule located adjacent to the adrenal gland  measuring 19 mm compared to a subtle 7 mm. Scan also showed stable post therapy consolidation in the RIGHT lower lobe and periphery metabolic cavitary process within the consolidation that was not changed from most recent FDG PET scan. There was no evidence of metastatic adenopathy indicated.   Patient reports to be doing well overall today. He denies any changes to his breathing, new abdominal or back pain, changes in his bowel habits, nausea or vomiting.    PREVIOUS RADIATION THERAPY: Yes  Indication for treatment:  Curative      Radiation treatment dates:  : 08/12/2021, 08/13/2021, 08/17/2021, 08/19/2021 Site/dose: Abdomen - 5.00Gy/Fx delivered in 6 fractions for a total of 30.00Gy (the patient was initially planned to receive 50.00Gy in 10 fractions) .  He stopped his treatment secondary to severe back pain which has subsequently been addressed surgically and is doing much better with this issue. Beams/energy:  6X-FFF (ultra hypofractionated radiation therapy)  Intent: Curative Radiation Treatment Dates: 04/24/2020 through 05/16/2020 Site Technique Total Dose (Gy) Dose per Fx (Gy) Completed Fx Beam Energies  Lung, Right: Lung_Rt 3D 28/28 2 14/14 6X, 10X     PAST MEDICAL HISTORY:  Past Medical History:  Diagnosis Date   Arthritis    Rheumatoid   Bacteremia due to Pseudomonas    C. difficile colitis    Chronic anxiety 11/08/2014   Cough 09/22/2021   COVID 2020   mild case   Depression    Drug-induced neutropenia (HCC) 08/11/2020   Family history of adverse reaction to anesthesia    brother with seizures had episode under anesthesia.  Patient has seizures as well.   GERD (gastroesophageal reflux disease)    History of radiation therapy 04/24/2020-05/16/2020   IMRT to right lung     Dr Retta Caster   History of radiation therapy    08/10/21-08/19/21-Dr. Retta Caster   Neutropenia [facility]) 05/12/2020   Neutropenic fever (HCC) 05/12/2020   Normocytic anemia 04/18/2021   PAF (paroxysmal  atrial fibrillation) (HCC)    CHADS2VSAC score 3   Rheumatoid aortitis    Secondary hypercoagulable state (HCC) 10/28/2020   Sepsis (HCC) 10/06/2020   Shortness of breath 12/18/2020   Squamous cell lung cancer (HCC)     PAST SURGICAL HISTORY: Past Surgical History:  Procedure Laterality Date   BRONCHIAL BRUSHINGS  03/27/2020   Procedure: BRONCHIAL BRUSHINGS;  Surgeon: Denson Flake, MD;  Location: [facility] ENDOSCOPY;  Service: Cardiopulmonary;;   BUBBLE STUDY  10/13/2020   Procedure: BUBBLE STUDY;  Surgeon: Hazle Lites, MD;  Location[facility] ENDOSCOPY;  Service: Cardiovascular;;   CARDIOVERSION N/A 10/13/2020   Procedure: CARDIOVERSION;  Surgeon: Hazle Lites, MD;  Location: [facility] ENDOSCOPY;  Service: Cardiovascular;  Laterality: N/A;   CATARACT EXTRACTION  2016   at [facility]   FINE NEEDLE ASPIRATION  03/27/2020   Procedure: FINE NEEDLE ASPIRATION;  Surgeon: Denson Flake, MD;  Location: [facility] ENDOSCOPY;  Service: Cardiopulmonary;;   HIP SURGERY Left    IR KYPHO EA ADDL LEVEL THORACIC OR LUMBAR  10/12/2021  IR KYPHO LUMBAR INC FX REDUCE BONE BX UNI/BIL CANNULATION INC/IMAGING  10/12/2021   IR KYPHO THORACIC WITH BONE BIOPSY  09/07/2021   IR RADIOLOGIST EVAL & MGMT  09/03/2021   RADIOLOGY WITH ANESTHESIA N/A 10/12/2021   Procedure: L1 and L3 Kyphoplasty;  Surgeon: de Macedo Rodrigues, Katyuc[place], MD;  Location: [facility] OR;  Service: Radiology;  Laterality: N/A;   TEE WITHOUT CARDIOVERSION N/A 10/13/2020   Procedure: TRANSESOPHAGEAL ECHOCARDIOGRAM (TEE);  Surgeon: Hazle Lites, MD;  Location: [facility] ENDOSCOPY;  Service: Cardiovascular;  Laterality: N/A;   VIDEO BRONCHOSCOPY WITH ENDOBRONCHIAL ULTRASOUND N/A 03/27/2020   Procedure: VIDEO BRONCHOSCOPY WITH ENDOBRONCHIAL ULTRASOUND;  Surgeon: Denson Flake, MD;  Location: [facility] ENDOSCOPY;  Service: Cardiopulmonary;  Laterality: N/A;    FAMILY HISTORY:  Family History  Problem Relation Age of Onset   Arrhythmia Mother        has PPM   Heart disease Father         Died at 52, started in his 36s, heart attacks, had PPM and ICD    SOCIAL HISTORY:  Social History   Tobacco Use   Smoking status: Former    Current packs/day: 0.00    Average packs/day: 1 pack/day for 30.0 years (30.0 ttl pk-yrs)    Types: Cigarettes    Start date: 97    Quit date: 2018    Years since quitting: 7.0   Smokeless tobacco: Never   Tobacco comments:    Former smoker, quit 2018  Vaping Use   Vaping status: Never Used  Substance Use Topics   Alcohol use: Not Currently   Drug use: Never    ALLERGIES:  Allergies  Allergen Reactions   Albuterol  Other (See Comments)    Patient has had episode of atrial fib following administration of albuterol  (tolerates Xopenex  well)   Atorvastatin  Other (See Comments)    Causes arthritis pain    MEDICATIONS:  Current Outpatient Medications  Medication Sig Dispense Refill   amoxicillin -clavulanate (AUGMENTIN ) 875-125 MG tablet Take 1 tablet by mouth 2 (two) times daily. 20 tablet 0   benzonatate  (TESSALON ) 200 MG capsule Take 1 capsule (200 mg total) by mouth 3 (three) times daily as needed. 45 capsule 3   diltiazem  (CARDIZEM ) 30 MG tablet Take 1 tablet by mouth as need for palpitations or Afib up to 4 times a day 30 tablet 6   ELIQUIS  5 MG TABS tablet TAKE 1 TABLET(5 MG) BY MOUTH TWICE DAILY 60 tablet 5   Evolocumab  (REPATHA  SURECLICK) 140 MG/ML SOAJ ADMINISTER 1 ML UNDER THE SKIN EVERY 14 DAYS 6 mL 1   HYDROcodone  bit-homatropine (HYDROMET) 5-1.5 MG/5ML syrup Take 5 mLs by mouth every 6 (six) hours as needed for cough. 240 mL 0   metoprolol  succinate (TOPROL -XL) 25 MG 24 hr tablet TAKE 1 TABLET(25 MG) BY MOUTH DAILY 90 tablet 1   omeprazole  (PRILOSEC ) 20 MG capsule Take 1 capsule (20 mg total) by mouth daily. 30 capsule 2   PARoxetine  (PAXIL ) 10 MG tablet Take 1 tablet (10 mg total) by mouth at bedtime. 90 tablet 2   predniSONE  (DELTASONE ) 10 MG tablet 4 tabs for 2 days, then 3 tabs for 2 days, 2 tabs for 2 days, then 1  tab for 2 days, then back to 5mg  daily 20 tablet 0   TRELEGY ELLIPTA  100-62.5-25 MCG/ACT AEPB INHALE 1 PUFF INTO THE LUNGS DAILY 60 each 5   predniSONE  (DELTASONE ) 5 MG tablet Take 1 tablet (5 mg total) by mouth daily with breakfast. (Patient  not taking: Reported on 03/02/2023) 90 tablet 4   No current facility-administered medications for this encounter.    REVIEW OF SYSTEMS:  Notable for that above.    PHYSICAL EXAM:  height is 6' (1.829 m) and weight is 256 lb (116.1 kg). His temperature is 97.7 F (36.5 C). His blood pressure is 122/81 and his pulse is 88. His respiration is 20 and oxygen saturation is 92%.   General: Alert and oriented, in no acute distress HEENT: Head is normocephalic. Extraocular movements are intact. Neck: Neck is supple, no palpable cervical or supraclavicular lymphadenopathy. Heart: Regular in rate and rhythm with no murmurs, rubs, or gallops. Chest: Clear to auscultation bilaterally, with no rhonchi, wheezes, or rales. Abdomen: Soft, nontender, nondistended, with no rigidity or guarding. Extremities: No cyanosis or edema. Lymphatics: see Neck Exam Skin: No concerning lesions. Musculoskeletal: symmetric strength and muscle tone throughout. No tenderness to palpation along the flank region.  Neurologic: Cranial nerves II through XII are grossly intact. No obvious focalities. Speech is fluent. Coordination is intact. Psychiatric: Judgment and insight are intact. Affect is appropriate.   ECOG = 0  0 - Asymptomatic (Fully active, able to carry on all predisease activities without restriction)  1 - Symptomatic but completely ambulatory (Restricted in physically strenuous activity but ambulatory and able to carry out work of a light or sedentary nature. For example, light housework, office work)  2 - Symptomatic, <50% in bed during the day (Ambulatory and capable of all self care but unable to carry out any work activities. Up and about more than 50% of waking  hours)  3 - Symptomatic, >50% in bed, but not bedbound (Capable of only limited self-care, confined to bed or chair 50% or more of waking hours)  4 - Bedbound (Completely disabled. Cannot carry on any self-care. Totally confined to bed or chair)  5 - Death   Aurea Blossom MM, Creech RH, Tormey DC, et al. [phone]). "Toxicity and response criteria of the [facility] Group". Am. Hillard Lowes. Oncol. 5 (6): 649-55  LABORATORY DATA:  Lab Results  Component Value Date   WBC 4.8 02/21/2023   HGB 12.9 (L) 02/21/2023   HCT 39.0 02/21/2023   MCV 90.1 02/21/2023   PLT 195 02/21/2023   NEUTROABS 2.9 02/21/2023   Lab Results  Component Value Date   NA 139 02/21/2023   K 4.4 02/21/2023   CL 107 02/21/2023   CO2 25 02/21/2023   GLUCOSE 83 02/21/2023   BUN 12 02/21/2023   CREATININE 1.14 02/21/2023   CALCIUM  9.0 02/21/2023      RADIOGRAPHY: NM PET Image Restag (PS) Skull Base To Thigh Result Date: 02/21/2023 CLINICAL DATA:  Subsequent non-small cell lung cancer. Stage IV squamous cell carcinoma RIGHT lung. Treatment strategy for lung cancer. Prior RIGHT adrenal metastasis. EXAM: NUCLEAR MEDICINE PET SKULL BASE TO THIGH TECHNIQUE: 12.8 mCi F-18 FDG was injected intravenously. Full-ring PET imaging was performed from the skull base to thigh after the radiotracer. CT data was obtained and used for attenuation correction and anatomic localization. Fasting blood glucose: 96 mg/dl COMPARISON:  None Available. FINDINGS: NECK: No hypermetabolic lymph nodes in the neck. Incidental CT findings: None. CHEST: Again demonstrated consolidation in the RIGHT lower lobe with air bronchograms. Within the consolidation of the RIGHT lower lobe there is a peripherally hypermetabolic cavitary lesion with SUV max equal 5.8 not changed from comparison exam. No hypermetabolic mediastinal lymph nodes. Incidental CT findings: None. ABDOMEN/PELVIS: Interval enlargement of RIGHT adrenal gland metastasis. The  periphery  calcified shrunken RIGHT adrenal metastasis is again noted. Adjacent there is a new soft tissue nodule measuring 19 mm compared to a subtle 7 mm. This new enlarging lesion is intensely metabolic with SUV max equal 17 on image 107. No liver metastasis. No hypermetabolic lymph nodes in the abdomen pelvis. Incidental CT findings: None. SKELETON: No focal hypermetabolic activity to suggest skeletal metastasis. Incidental CT findings: None. IMPRESSION: 1. Interval enlargement of RIGHT adrenal gland metastasis. 2. Stable post therapy consolidation in the RIGHT lower lobe. Periphery metabolic cavitary process within the consolidation is not changed from most recent FDG PET scan. 3. No evidence of metastatic adenopathy. Electronically Signed   By: Deboraha Fallow M.D.   On: 02/21/2023 15:40   LONG TERM MONITOR (3-14 DAYS) Result Date: 02/20/2023   Predominant rhythm was normal sinus rhythm with average heart rate 72bpm and ranged from 51 to 138bpm   1 episode of SVT lasting 9 beats at 113bpm   Occasional PACs and rare atrial couplets and triplets   Rare PVCs Patch Wear Time:  13 days and 20 hours (2024-12-02T19:49:46-0500 to 2024-12-16T16:25:44-0500) Patient had a min HR of 51 bpm, max HR of 138 bpm, and avg HR of 72 bpm. Predominant underlying rhythm was Sinus Rhythm. 1 run of Supraventricular Tachycardia occurred lasting 9 beats with a max rate of 113 bpm (avg 110 bpm). Isolated SVEs were occasional (1.4%, 17612), SVE Couplets were rare (<1.0%, 212), and SVE Triplets were rare (<1.0%, 42). Isolated VEs were rare (<1.0%), and no VE Couplets or VE Triplets were present.      IMPRESSION:  63 yo gentleman with stage IV (cT4, cN2, cM1c) squamous cell carcinoma of the right upper lobe, now with an enlarging adrenal metastasis.   We reviewed the patient's case and most recent imaging. PET scan demonstrates enlargement of a nodule adjacent to the right adrenal metastasis. Patient remains asymptomatic, however, we  recommend treatment to provide locoregional disease control. Patient is a good candidate for stereotactic body radiotherapy (SBRT) to PET positive nodule adjacent to the right adrenal gland.  Today, I talked to the patient and family about the findings and work-up thus far.  We discussed the natural history of adrenal metastases and general treatment, highlighting the role of radiotherapy in the management.  We discussed the available radiation techniques, and focused on the details of logistics and delivery.  We reviewed the anticipated acute and late sequelae associated with radiation in this setting.  The patient was encouraged to ask questions that I answered to the best of my ability. A patient consent form was discussed and signed.  We retained a copy for our records.  The patient would like to proceed with radiation and will be scheduled for CT simulation.  PLAN: Patient is scheduled for CT simulation on 03/07/2023. Anticipate 5 fractions of SBRT directed at the right adrenal metastasis.    40 minutes of total time was spent for this patient encounter, including preparation, face-to-face counseling with the patient and coordination of care, physical exam, and documentation of the encounter.   ------------------------------------------------   Julio Ohm, PA-C   Noralee Beam, PhD, MD   [facility]  Radiation Oncology Direct Dial: [phone]  Fax: [phone] [email or web address]    This document serves as a record of services personally performed by Retta Caster, MD. It was created on his behalf by Lucky Sable, a trained medical scribe. The creation of this record is based on the scribe's personal observations and the provider's statements to  them. This document has been checked and approved by the attending provider.

## 2023-03-01 NOTE — Progress Notes (Deleted)
 [name]  nurse

## 2023-03-02 ENCOUNTER — Ambulatory Visit
Admission: RE | Admit: 2023-03-02 | Discharge: 2023-03-02 | Disposition: A | Discharge status: Home / Self Care | Payer: Managed Care, Other (non HMO) | Source: Ambulatory Visit | Attending: Radiation Oncology | Admitting: Radiation Oncology

## 2023-03-02 VITALS — BP 122/81 | HR 88 | Temp 97.7°F | Resp 20 | Ht 72.0 in | Wt 256.0 lb

## 2023-03-02 DIAGNOSIS — Z87891 Personal history of nicotine dependence: Secondary | ICD-10-CM | POA: Diagnosis not present

## 2023-03-02 DIAGNOSIS — I480 Paroxysmal atrial fibrillation: Secondary | ICD-10-CM | POA: Insufficient documentation

## 2023-03-02 DIAGNOSIS — Z7952 Long term (current) use of systemic steroids: Secondary | ICD-10-CM | POA: Diagnosis not present

## 2023-03-02 DIAGNOSIS — R61 Generalized hyperhidrosis: Secondary | ICD-10-CM | POA: Diagnosis not present

## 2023-03-02 DIAGNOSIS — D6869 Other thrombophilia: Secondary | ICD-10-CM | POA: Insufficient documentation

## 2023-03-02 DIAGNOSIS — Z7901 Long term (current) use of anticoagulants: Secondary | ICD-10-CM | POA: Diagnosis not present

## 2023-03-02 DIAGNOSIS — C7971 Secondary malignant neoplasm of right adrenal gland: Secondary | ICD-10-CM | POA: Insufficient documentation

## 2023-03-02 DIAGNOSIS — Z923 Personal history of irradiation: Secondary | ICD-10-CM | POA: Insufficient documentation

## 2023-03-02 DIAGNOSIS — Z8616 Personal history of COVID-19: Secondary | ICD-10-CM | POA: Diagnosis not present

## 2023-03-02 DIAGNOSIS — K219 Gastro-esophageal reflux disease without esophagitis: Secondary | ICD-10-CM | POA: Insufficient documentation

## 2023-03-02 DIAGNOSIS — D702 Other drug-induced agranulocytosis: Secondary | ICD-10-CM | POA: Insufficient documentation

## 2023-03-02 DIAGNOSIS — C3411 Malignant neoplasm of upper lobe, right bronchus or lung: Secondary | ICD-10-CM | POA: Insufficient documentation

## 2023-03-02 DIAGNOSIS — Z79899 Other long term (current) drug therapy: Secondary | ICD-10-CM | POA: Diagnosis not present

## 2023-03-07 ENCOUNTER — Ambulatory Visit
Admission: RE | Admit: 2023-03-07 | Discharge: 2023-03-07 | Disposition: A | Discharge status: Home / Self Care | Payer: Managed Care, Other (non HMO) | Source: Ambulatory Visit | Attending: Radiation Oncology | Admitting: Radiation Oncology

## 2023-03-07 DIAGNOSIS — C3411 Malignant neoplasm of upper lobe, right bronchus or lung: Secondary | ICD-10-CM | POA: Insufficient documentation

## 2023-03-07 DIAGNOSIS — C7971 Secondary malignant neoplasm of right adrenal gland: Secondary | ICD-10-CM | POA: Insufficient documentation

## 2023-03-17 DIAGNOSIS — C7971 Secondary malignant neoplasm of right adrenal gland: Secondary | ICD-10-CM | POA: Diagnosis not present

## 2023-03-21 ENCOUNTER — Ambulatory Visit
Admission: RE | Admit: 2023-03-21 | Discharge: 2023-03-21 | Disposition: A | Discharge status: Home / Self Care | Payer: Managed Care, Other (non HMO) | Source: Ambulatory Visit | Attending: Radiation Oncology | Admitting: Radiation Oncology

## 2023-03-21 ENCOUNTER — Other Ambulatory Visit: Payer: Self-pay

## 2023-03-21 DIAGNOSIS — C3491 Malignant neoplasm of unspecified part of right bronchus or lung: Secondary | ICD-10-CM | POA: Insufficient documentation

## 2023-03-21 DIAGNOSIS — C7971 Secondary malignant neoplasm of right adrenal gland: Secondary | ICD-10-CM | POA: Insufficient documentation

## 2023-03-21 DIAGNOSIS — C3411 Malignant neoplasm of upper lobe, right bronchus or lung: Secondary | ICD-10-CM | POA: Diagnosis present

## 2023-03-21 DIAGNOSIS — C779 Secondary and unspecified malignant neoplasm of lymph node, unspecified: Secondary | ICD-10-CM | POA: Insufficient documentation

## 2023-03-21 LAB — RAD ONC ARIA SESSION SUMMARY
Course Elapsed Days: 0
Plan Fractions Treated to Date: 1
Plan Prescribed Dose Per Fraction: 8 Gy
Plan Total Fractions Prescribed: 5
Plan Total Prescribed Dose: 40 Gy
Reference Point Dosage Given to Date: 8 Gy
Reference Point Session Dosage Given: 8 Gy
Session Number: 1

## 2023-03-22 ENCOUNTER — Ambulatory Visit: Payer: Managed Care, Other (non HMO) | Admitting: Radiation Oncology

## 2023-03-23 ENCOUNTER — Inpatient Hospital Stay: Payer: Managed Care, Other (non HMO)

## 2023-03-23 ENCOUNTER — Inpatient Hospital Stay: Payer: Managed Care, Other (non HMO) | Admitting: Hematology & Oncology

## 2023-03-23 ENCOUNTER — Encounter: Payer: Self-pay | Admitting: Hematology & Oncology

## 2023-03-23 ENCOUNTER — Inpatient Hospital Stay: Payer: Managed Care, Other (non HMO) | Attending: Internal Medicine

## 2023-03-23 ENCOUNTER — Ambulatory Visit
Admission: RE | Admit: 2023-03-23 | Discharge: 2023-03-23 | Disposition: A | Discharge status: Home / Self Care | Payer: Managed Care, Other (non HMO) | Source: Ambulatory Visit | Attending: Radiation Oncology | Admitting: Radiation Oncology

## 2023-03-23 ENCOUNTER — Other Ambulatory Visit: Payer: Self-pay

## 2023-03-23 VITALS — BP 119/74 | HR 66 | Temp 97.9°F | Resp 20 | Ht 72.0 in | Wt 255.4 lb

## 2023-03-23 DIAGNOSIS — C3491 Malignant neoplasm of unspecified part of right bronchus or lung: Secondary | ICD-10-CM

## 2023-03-23 DIAGNOSIS — C7971 Secondary malignant neoplasm of right adrenal gland: Secondary | ICD-10-CM

## 2023-03-23 DIAGNOSIS — C779 Secondary and unspecified malignant neoplasm of lymph node, unspecified: Secondary | ICD-10-CM | POA: Insufficient documentation

## 2023-03-23 DIAGNOSIS — C3411 Malignant neoplasm of upper lobe, right bronchus or lung: Secondary | ICD-10-CM | POA: Diagnosis not present

## 2023-03-23 DIAGNOSIS — D702 Other drug-induced agranulocytosis: Secondary | ICD-10-CM

## 2023-03-23 LAB — RAD ONC ARIA SESSION SUMMARY
Course Elapsed Days: 2
Plan Fractions Treated to Date: 2
Plan Prescribed Dose Per Fraction: 8 Gy
Plan Total Fractions Prescribed: 5
Plan Total Prescribed Dose: 40 Gy
Reference Point Dosage Given to Date: 16 Gy
Reference Point Session Dosage Given: 8 Gy
Session Number: 2

## 2023-03-23 LAB — CMP (CANCER CENTER ONLY)
ALT: 12 U/L (ref 0–44)
AST: 17 U/L (ref 15–41)
Albumin: 4 g/dL (ref 3.5–5.0)
Alkaline Phosphatase: 64 U/L (ref 38–126)
Anion gap: 7 (ref 5–15)
BUN: 11 mg/dL (ref 8–23)
CO2: 25 mmol/L (ref 22–32)
Calcium: 9 mg/dL (ref 8.9–10.3)
Chloride: 106 mmol/L (ref 98–111)
Creatinine: 1.08 mg/dL (ref 0.61–1.24)
GFR, Estimated: >60 mL/min (ref >60)
Glucose, Bld: 114 mg/dL — ABNORMAL HIGH (ref 70–99)
Potassium: 5.3 mmol/L — ABNORMAL HIGH (ref 3.5–5.1)
Sodium: 138 mmol/L (ref 135–145)
Total Bilirubin: 0.9 mg/dL (ref 0.0–1.2)
Total Protein: 7.2 g/dL (ref 6.5–8.1)

## 2023-03-23 LAB — CBC WITH DIFFERENTIAL (CANCER CENTER ONLY)
Abs Immature Granulocytes: 0.01 10*3/uL (ref 0.00–0.07)
Basophils Absolute: 0 10*3/uL (ref 0.0–0.1)
Basophils Relative: 1 %
Eosinophils Absolute: 0.1 10*3/uL (ref 0.0–0.5)
Eosinophils Relative: 3 %
HCT: 40.3 % (ref 39.0–52.0)
Hemoglobin: 12.9 g/dL — ABNORMAL LOW (ref 13.0–17.0)
Immature Granulocytes: 0 %
Lymphocytes Relative: 18 %
Lymphs Abs: 0.6 10*3/uL — ABNORMAL LOW (ref 0.7–4.0)
MCH: 28.8 pg (ref 26.0–34.0)
MCHC: 32 g/dL (ref 30.0–36.0)
MCV: 90 fL (ref 80.0–100.0)
Monocytes Absolute: 0.5 10*3/uL (ref 0.1–1.0)
Monocytes Relative: 15 %
Neutro Abs: 2.2 10*3/uL (ref 1.7–7.7)
Neutrophils Relative %: 63 %
Platelet Count: 192 10*3/uL (ref 150–400)
RBC: 4.48 MIL/uL (ref 4.22–5.81)
RDW: 14.7 % (ref 11.5–15.5)
WBC Count: 3.5 10*3/uL — ABNORMAL LOW (ref 4.0–10.5)
nRBC: 0 % (ref 0.0–0.2)

## 2023-03-23 LAB — LACTATE DEHYDROGENASE: LDH: 218 U/L — ABNORMAL HIGH (ref 98–192)

## 2023-03-23 MED ORDER — DENOSUMAB 120 MG/1.7ML ~~LOC~~ SOLN
120.0000 mg | Freq: Once | SUBCUTANEOUS | Status: AC
  Administered 2023-03-23: 120 mg via SUBCUTANEOUS
  Filled 2023-03-23: qty 1.7

## 2023-03-23 NOTE — Progress Notes (Signed)
 John [name] + Hematology and Oncology Follow Up Visit  John [name] [id number] 1961/01/15 63 y.o. 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 3 months --next dose in 06/2023  XRT -right adrenal gland.  2 finish on 04/04/2023   Interim History:  John [name] is here today for follow-up.  He looks quite good.  He feels good.  He is getting his radiation therapy to the right adrenal gland.  He says he gets his radiation every other day.  Hopefully he will finish out in a couple weeks.  He is not complaining of any pain.  He has had no problems with breathing.  He has had no issues with his heart.  He has no change in bowel or bladder habits.  Again, he is eating well..  By think he is still working, maybe even part-time.  He has had no problems with rashes.  He has had no bleeding.  He has had no headache.  Overall, I will have to say that his performance status is probably ECOG 1.      Medications:  Allergies as of 03/23/2023       Reactions   Albuterol  Other (See Comments)   Patient has had episode of atrial fib following administration of albuterol  (tolerates Xopenex  well)   Atorvastatin  Other (See Comments)   Causes arthritis pain        Medication List        Accurate as of March 23, 2023  3:33 PM. If you have any questions, ask your nurse or doctor.          STOP taking these medications    amoxicillin -clavulanate 875-125 MG tablet Commonly known as: AUGMENTIN  Stopped by: John [name]       TAKE these medications    benzonatate  200 MG capsule Commonly known as: TESSALON  Take 1  capsule (200 mg total) by mouth 3 (three) times daily as needed.   diltiazem  30 MG tablet Commonly known as: Cardizem  Take 1 tablet by mouth as need for palpitations or Afib up to 4 times a day   Eliquis  5 MG Tabs tablet Generic drug: apixaban  TAKE 1 TABLET(5 MG) BY MOUTH TWICE DAILY   HYDROcodone  bit-homatropine 5-1.5 MG/5ML syrup Commonly known as: Hydromet Take 5 mLs by mouth every 6 (six) hours as needed for cough.   metoprolol  succinate 25 MG 24 hr tablet Commonly known as: TOPROL -XL TAKE 1 TABLET(25 MG) BY MOUTH DAILY   omeprazole  20 MG capsule Commonly known as: PRILOSEC  Take 1 capsule (20 mg total) by mouth daily.   PARoxetine  10 MG tablet Commonly known as: PAXIL  Take 1 tablet (10 mg total) by mouth at bedtime.   predniSONE  5 MG tablet Commonly known as: DELTASONE  Take 1 tablet (5 mg total) by mouth daily with breakfast. What changed: Another medication with the same name was removed. Continue taking this medication, and follow the directions you see here. Changed by: John [name]   Repatha  SureClick 140 MG/ML Soaj Generic drug: Evolocumab  ADMINISTER 1 ML UNDER THE SKIN EVERY 14 DAYS   Trelegy  Ellipta 100-62.5-25 MCG/ACT Aepb Generic drug: Fluticasone -Umeclidin-Vilant INHALE 1 PUFF INTO THE LUNGS DAILY        Allergies:  Allergies  Allergen Reactions   Albuterol  Other (See Comments)    Patient has had episode of atrial fib following administration of albuterol  (tolerates Xopenex  well)   Atorvastatin  Other (See Comments)    Causes arthritis pain    Past Medical History, Surgical history, Social history, and Family History were reviewed and updated.  Review of Systems: Review of Systems  Constitutional:  Positive for malaise/fatigue.  HENT: Negative.    Eyes: Negative.   Respiratory:  Positive for cough and shortness of breath.   Cardiovascular:  Positive for chest pain.  Gastrointestinal: Negative.   Genitourinary: Negative.    Musculoskeletal:  Positive for joint pain.  Skin: Negative.   Neurological:  Positive for focal weakness.  Endo/Heme/Allergies: Negative.   Psychiatric/Behavioral: Negative.       Physical Exam:  Temperature is 97.9.  Pulse 66.  Blood pressure 119/74.  Weight is 255 pounds.    Wt Readings from Last 3 Encounters:  03/23/23 255 lb 6.4 oz (115.8 kg)  03/02/23 256 lb (116.1 kg)  02/21/23 253 lb (114.8 kg)    Physical Exam Vitals reviewed.  HENT:     Head: Normocephalic and atraumatic.  Eyes:     Pupils: Pupils are equal, round, and reactive to light.  Cardiovascular:     Rate and Rhythm: Normal rate and regular rhythm.     Heart sounds: Normal heart sounds.  Pulmonary:     Effort: Pulmonary effort is normal.     Breath sounds: Normal breath sounds.  Abdominal:     General: Bowel sounds are normal.     Palpations: Abdomen is soft.  Musculoskeletal:        General: No tenderness or deformity. Normal range of motion.     Cervical back: Normal range of motion.  Lymphadenopathy:     Cervical: No cervical adenopathy.  Skin:    General: Skin is warm and dry.     Findings: No erythema or rash.  Neurological:     Mental Status: He is alert and oriented to person, place, and time.  Psychiatric:        Behavior: Behavior normal.        Thought Content: Thought content normal.        Judgment: Judgment normal.     Lab Results  Component Value Date   WBC 3.5 (L) 03/23/2023   HGB 12.9 (L) 03/23/2023   HCT 40.3 03/23/2023   MCV 90.0 03/23/2023   PLT 192 03/23/2023   Lab Results  Component Value Date   FERRITIN 446 (H) 08/23/2021   IRON 189 (H) 08/26/2021   TIBC 213 (L) 08/26/2021   UIBC 24 08/26/2021   IRONPCTSAT 89 (H) 08/26/2021   Lab Results  Component Value Date   RETICCTPCT 1.9 08/23/2021   RBC 4.48 03/23/2023   No results found for: KPAFRELGTCHN, LAMBDASER, KAPLAMBRATIO No results found for: IGGSERUM, IGA, IGMSERUM No results found for:  STEPHANY CARLOTA BENSON MARKEL EARLA JOANNIE DOC VICK, SPEI   Chemistry      Component Value Date/Time   NA 138 03/23/2023 1446   NA 141 03/01/2022 0749   K 5.3 (H) 03/23/2023 1446   CL 106 03/23/2023 1446   CO2 25 03/23/2023 1446   BUN 11 03/23/2023 1446   BUN 11 03/01/2022 0749   CREATININE 1.08 03/23/2023 1446      Component Value Date/Time  CALCIUM  9.0 03/23/2023 1446   ALKPHOS 64 03/23/2023 1446   AST 17 03/23/2023 1446   ALT 12 03/23/2023 1446   BILITOT 0.9 03/23/2023 1446       Impression and Plan: Mr. Giovanelli is a very pleasant 63 yo gentleman with metastatic squamous cell carcinoma of the right lung.  The tumor had high PD-L1 score.  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  We are giving him further radiation therapy to this right adrenal met.  I know he has had radiation therapy to this area before.  However, I really think that this next course of radiation should hopefully eradicate what is there.  Overall, his quality of life continues to do quite well.  He is doing what he [place] would like to do.  He really has had no restrictions.  He is outside.  He has a dog that he does a lot of things with.  Again I think he is working and having no problems at work.  He will get his Xgeva  today.  We will plan to get him back probably about 6 weeks.  I would not do another PET scan on probably until April or May.   John John Crease, MD 2/5/20253:33 PM

## 2023-03-23 NOTE — Progress Notes (Signed)
 Ok to give Xgeva  ~ 2 weeks early.  Jobe Mulder [place], [place], BCPS, BCOP 03/23/2023 3:23 PM

## 2023-03-23 NOTE — Patient Instructions (Signed)
 Denosumab  Injection (Oncology) What is this medication? DENOSUMAB  (den oh SUE mab) prevents weakened bones caused by cancer. It may also be used to treat noncancerous bone tumors that cannot be removed by surgery. It can also be used to treat high calcium  levels in the blood caused by cancer. It works by blocking a protein that causes bones to break down quickly. This slows down the release of calcium  from bones, which lowers calcium  levels in your blood. It also makes your bones stronger and less likely to break (fracture). This medicine may be used for other purposes; ask your health care provider or pharmacist if you have questions. COMMON BRAND NAME(S): XGEVA  What should I tell my care team before I take this medication? They need to know if you have any of these conditions: Dental disease Having surgery or tooth extraction Infection Kidney disease Low levels of calcium  or vitamin D  in the blood Malnutrition On hemodialysis Skin conditions or sensitivity Thyroid  or parathyroid disease An unusual reaction to denosumab , other medications, foods, dyes, or preservatives Pregnant or trying to get pregnant Breast-feeding How should I use this medication? This medication is for injection under the skin. It is given by your care team in a hospital or clinic setting. A special MedGuide will be given to you before each treatment. Be sure to read this information carefully each time. Talk to your care team about the use of this medication in children. While it may be prescribed for children as young as 13 years for selected conditions, precautions do apply. Overdosage: If you think you have taken too much of this medicine contact a poison control center or emergency room at once. NOTE: This medicine is only for you. Do not share this medicine with others. What if I miss a dose? Keep appointments for follow-up doses. It is important not to miss your dose. Call your care team if you are unable to  keep an appointment. What may interact with this medication? Do not take this medication with any of the following: Other medications containing denosumab  This medication may also interact with the following: Medications that lower your chance of fighting infection Steroid medications, such as prednisone  or cortisone This list may not describe all possible interactions. Give your health care provider a list of all the medicines, herbs, non-prescription drugs, or dietary supplements you use. Also tell them if you smoke, drink alcohol, or use illegal drugs. Some items may interact with your medicine. What should I watch for while using this medication? Your condition will be monitored carefully while you are receiving this medication. You may need blood work while taking this medication. This medication may increase your risk of getting an infection. Call your care team for advice if you get a fever, chills, sore throat, or other symptoms of a cold or flu. Do not treat yourself. Try to avoid being around people who are sick. You should make sure you get enough calcium  and vitamin D  while you are taking this medication, unless your care team tells you not to. Discuss the foods you eat and the vitamins you take with your care team. Some people who take this medication have severe bone, joint, or muscle pain. This medication may also increase your risk for jaw problems or a broken thigh bone. Tell your care team right away if you have severe pain in your jaw, bones, joints, or muscles. Tell your care team if you have any pain that does not go away or that gets worse. Talk  to your care team if you may be pregnant. Serious birth defects can occur if you take this medication during pregnancy and for 5 months after the last dose. You will need a negative pregnancy test before starting this medication. Contraception is recommended while taking this medication and for 5 months after the last dose. Your care team  can help you find the option that works for you. What side effects may I notice from receiving this medication? Side effects that you should report to your care team as soon as possible: Allergic reactions--skin rash, itching, hives, swelling of the face, lips, tongue, or throat Bone, joint, or muscle pain Low calcium  level--muscle pain or cramps, confusion, tingling, or numbness in the hands or feet Osteonecrosis of the jaw--pain, swelling, or redness in the mouth, numbness of the jaw, poor healing after dental work, unusual discharge from the mouth, visible bones in the mouth Side effects that usually do not require medical attention (report to your care team if they continue or are bothersome): Cough Diarrhea Fatigue Headache Nausea This list may not describe all possible side effects. Call your doctor for medical advice about side effects. You may report side effects to FDA at 1-800-FDA-1088. Where should I keep my medication? This medication is given in a hospital or clinic. It will not be stored at home. NOTE: This sheet is a summary. It may not cover all possible information. If you have questions about this medicine, talk to your doctor, pharmacist, or health care provider.  2024 Elsevier/Gold Standard (2021-06-24 00:00:00)

## 2023-03-24 ENCOUNTER — Ambulatory Visit: Payer: Managed Care, Other (non HMO) | Admitting: Radiation Oncology

## 2023-03-25 ENCOUNTER — Other Ambulatory Visit: Payer: Self-pay

## 2023-03-25 ENCOUNTER — Ambulatory Visit
Admission: RE | Admit: 2023-03-25 | Discharge: 2023-03-25 | Disposition: A | Discharge status: Home / Self Care | Payer: Managed Care, Other (non HMO) | Source: Ambulatory Visit | Attending: Radiation Oncology | Admitting: Radiation Oncology

## 2023-03-25 DIAGNOSIS — C3411 Malignant neoplasm of upper lobe, right bronchus or lung: Secondary | ICD-10-CM | POA: Diagnosis not present

## 2023-03-25 LAB — RAD ONC ARIA SESSION SUMMARY
Course Elapsed Days: 4
Plan Fractions Treated to Date: 3
Plan Prescribed Dose Per Fraction: 8 Gy
Plan Total Fractions Prescribed: 5
Plan Total Prescribed Dose: 40 Gy
Reference Point Dosage Given to Date: 24 Gy
Reference Point Session Dosage Given: 8 Gy
Session Number: 3

## 2023-03-28 ENCOUNTER — Other Ambulatory Visit: Payer: Self-pay

## 2023-03-28 ENCOUNTER — Emergency Department (HOSPITAL_COMMUNITY): Payer: Managed Care, Other (non HMO)

## 2023-03-28 ENCOUNTER — Emergency Department (HOSPITAL_COMMUNITY)
Admission: EM | Admit: 2023-03-28 | Discharge: 2023-03-28 | Disposition: A | Discharge status: Home / Self Care | Payer: Managed Care, Other (non HMO) | Source: Home / Self Care | Attending: Emergency Medicine | Admitting: Emergency Medicine

## 2023-03-28 ENCOUNTER — Encounter (HOSPITAL_COMMUNITY): Payer: Self-pay

## 2023-03-28 DIAGNOSIS — Z7901 Long term (current) use of anticoagulants: Secondary | ICD-10-CM | POA: Insufficient documentation

## 2023-03-28 DIAGNOSIS — Z79899 Other long term (current) drug therapy: Secondary | ICD-10-CM | POA: Insufficient documentation

## 2023-03-28 DIAGNOSIS — I4819 Other persistent atrial fibrillation: Secondary | ICD-10-CM | POA: Diagnosis not present

## 2023-03-28 DIAGNOSIS — I480 Paroxysmal atrial fibrillation: Secondary | ICD-10-CM | POA: Insufficient documentation

## 2023-03-28 DIAGNOSIS — Z20822 Contact with and (suspected) exposure to covid-19: Secondary | ICD-10-CM | POA: Insufficient documentation

## 2023-03-28 DIAGNOSIS — I4891 Unspecified atrial fibrillation: Secondary | ICD-10-CM | POA: Diagnosis not present

## 2023-03-28 LAB — COMPREHENSIVE METABOLIC PANEL
ALT: 13 U/L (ref 0–44)
AST: 18 U/L (ref 15–41)
Albumin: 3.7 g/dL (ref 3.5–5.0)
Alkaline Phosphatase: 61 U/L (ref 38–126)
Anion gap: 10 (ref 5–15)
BUN: 16 mg/dL (ref 8–23)
CO2: 22 mmol/L (ref 22–32)
Calcium: 8.5 mg/dL — ABNORMAL LOW (ref 8.9–10.3)
Chloride: 103 mmol/L (ref 98–111)
Creatinine, Ser: 1.04 mg/dL (ref 0.61–1.24)
GFR, Estimated: >60 mL/min (ref >60)
Glucose, Bld: 105 mg/dL — ABNORMAL HIGH (ref 70–99)
Potassium: 4 mmol/L (ref 3.5–5.1)
Sodium: 135 mmol/L (ref 135–145)
Total Bilirubin: 1.2 mg/dL (ref 0.0–1.2)
Total Protein: 7.6 g/dL (ref 6.5–8.1)

## 2023-03-28 LAB — CBC WITH DIFFERENTIAL/PLATELET
Abs Immature Granulocytes: 0.03 10*3/uL (ref 0.00–0.07)
Basophils Absolute: 0 10*3/uL (ref 0.0–0.1)
Basophils Relative: 1 %
Eosinophils Absolute: 0.1 10*3/uL (ref 0.0–0.5)
Eosinophils Relative: 3 %
HCT: 41.8 % (ref 39.0–52.0)
Hemoglobin: 13.3 g/dL (ref 13.0–17.0)
Immature Granulocytes: 1 %
Lymphocytes Relative: 10 %
Lymphs Abs: 0.4 10*3/uL — ABNORMAL LOW (ref 0.7–4.0)
MCH: 28.2 pg (ref 26.0–34.0)
MCHC: 31.8 g/dL (ref 30.0–36.0)
MCV: 88.7 fL (ref 80.0–100.0)
Monocytes Absolute: 0.6 10*3/uL (ref 0.1–1.0)
Monocytes Relative: 15 %
Neutro Abs: 2.8 10*3/uL (ref 1.7–7.7)
Neutrophils Relative %: 70 %
Platelets: 198 10*3/uL (ref 150–400)
RBC: 4.71 MIL/uL (ref 4.22–5.81)
RDW: 14.8 % (ref 11.5–15.5)
WBC: 4 10*3/uL (ref 4.0–10.5)
nRBC: 0 % (ref 0.0–0.2)

## 2023-03-28 LAB — RESP PANEL BY RT-PCR (RSV, FLU A&B, COVID)  RVPGX2
Influenza A by PCR: NEGATIVE
Influenza B by PCR: NEGATIVE
Resp Syncytial Virus by PCR: NEGATIVE
SARS Coronavirus 2 by RT PCR: NEGATIVE

## 2023-03-28 LAB — TSH: TSH: <0.01 u[IU]/mL — ABNORMAL LOW (ref 0.350–4.500)

## 2023-03-28 LAB — D-DIMER, QUANTITATIVE: D-Dimer, Quant: 2.7 ug{FEU}/mL — ABNORMAL HIGH (ref 0.00–0.50)

## 2023-03-28 LAB — MAGNESIUM: Magnesium: 1.8 mg/dL (ref 1.7–2.4)

## 2023-03-28 LAB — T4, FREE: Free T4: 2.09 ng/dL — ABNORMAL HIGH (ref 0.61–1.12)

## 2023-03-28 MED ORDER — SODIUM CHLORIDE (PF) 0.9 % IJ SOLN
INTRAMUSCULAR | Status: AC
  Filled 2023-03-28: qty 50

## 2023-03-28 MED ORDER — DILTIAZEM HCL 30 MG PO TABS
30.0000 mg | ORAL_TABLET | Freq: Once | ORAL | Status: AC
  Administered 2023-03-28: 30 mg via ORAL
  Filled 2023-03-28: qty 1

## 2023-03-28 MED ORDER — DILTIAZEM HCL 25 MG/5ML IV SOLN
15.0000 mg | Freq: Once | INTRAVENOUS | Status: AC
  Administered 2023-03-28: 15 mg via INTRAVENOUS
  Filled 2023-03-28: qty 5

## 2023-03-28 MED ORDER — LACTATED RINGERS IV BOLUS
1000.0000 mL | Freq: Once | INTRAVENOUS | Status: AC
  Administered 2023-03-28: 1000 mL via INTRAVENOUS

## 2023-03-28 MED ORDER — IOHEXOL 350 MG/ML SOLN
75.0000 mL | Freq: Once | INTRAVENOUS | Status: AC | PRN
  Administered 2023-03-28: 75 mL via INTRAVENOUS

## 2023-03-28 NOTE — ED Notes (Signed)
 Placed pt on 2 liters of 02 for comfort

## 2023-03-28 NOTE — ED Triage Notes (Signed)
 Felt heart racing today, hx of lung cancer and on radiation. Pt HR is 145 on arrival with runs of VTACH. C/o sob, but states it is his normal, no worse than usual. Intermittent dizziness. Denies chest pain.

## 2023-03-28 NOTE — ED Provider Notes (Signed)
 Pt signed out by Dr. Drury Geralds pending CT chest.  CT chest reviewed by me.  I agree with the radiologist.    IMPRESSION:  1. Negative for acute pulmonary embolus.  2. Heterogeneous consolidation in the right lower lobe with  surrounding cavitary process, corresponding to history of treated  lung cancer on recent PET CT.  3. Slightly progressive diffuse bilateral ground-glass density or  mosaicism compared to prior which could be due to acute airways  disease versus diffuse bilateral infectious or inflammatory process.  4. Right adrenal high density lesion with soft tissue nodule along  the left side, corresponding to the area of activity/metastatic  disease on prior PET CT. The less dense soft tissue nodule might be  slightly larger, measuring 2.4 cm compared with 1.9 cm.  5. Gallstone at the neck of the gallbladder.    Aortic Atherosclerosis (ICD10-I70.0).    Pt's rate has improved.  He had been on amiodarone  at one time, but was taken off it due to concerns about his lungs.  He wants to go on it again because he is tired of being in afib all the time.  He has some at home that he will take.  He will call Dr. Charl Concha office in the morning and follow with her.  He feels well now. He is stable for d/c.  Return if worse.    Sueellen Emery, MD 03/28/23 1910

## 2023-03-28 NOTE — ED Provider Notes (Signed)
[facility] AT [facility] Provider Note   CSN: [id number] Arrival date & time: 03/28/23  1249     History  Chief Complaint  Patient presents with   Tachycardia    John [name] is a 63 y.o. 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  He noticed this morning when he was loading horses out.  He denies any chest pain or shortness of breath but endorses dizziness.  He has been eating and drinking well recently, no fevers chills, viral URI symptoms, nausea vomiting diarrhea, urinary symptoms, abdominal pain.  He notes chronic shortness of breath but states he is always short of breath.  He is currently receiving radiation for a metastasis on his adrenal gland from squamous cell carcinoma of the right lung.  Not currently receiving any chemotherapy.  Has not had any issues with A-fib for greater than 1 year and normally is in normal sinus rhythm.  Has been compliant with Eliquis and diltiazem.  Had been taking amiodarone but oncologist did not want him taking amiodarone so it was stopped..  Denies any leg swelling or history of DVT or PE.  No recent hospitalizations, surgeries, travel.    Past Medical History:  Diagnosis Date   Arthritis    Rheumatoid   Bacteremia due to Pseudomonas    C. difficile colitis    Chronic anxiety 11/08/2014   Cough 09/22/2021   COVID 2020   mild case   Depression    Drug-induced neutropenia (HCC) 08/11/2020   Family history of adverse reaction to anesthesia    brother with seizures had episode under anesthesia.  Patient has seizures as well.   GERD (gastroesophageal reflux disease)    History of radiation therapy 04/24/2020-05/16/2020   IMRT to right lung     Dr Antony Blackbird   History of radiation therapy    08/10/21-08/19/21-Dr. Antony Blackbird   Neutropenia [facility]) 05/12/2020    Neutropenic fever (HCC) 05/12/2020   Normocytic anemia 04/18/2021   PAF (paroxysmal atrial fibrillation) (HCC)    CHADS2VSAC score 3   Rheumatoid aortitis    Secondary hypercoagulable state (HCC) 10/28/2020   Sepsis (HCC) 10/06/2020   Shortness of breath 12/18/2020   Squamous cell lung cancer (HCC)        Home Medications Prior to Admission medications   Medication Sig Start Date End Date Taking? Authorizing Provider  benzonatate (TESSALON) 200 MG capsule Take 1 capsule (200 mg total) by mouth 3 (three) times daily as needed. Patient not taking: Reported on 03/23/2023 02/25/23 02/25/24  Parrett, Virgel Bouquet, NP  diltiazem (CARDIZEM) 30 MG tablet Take 1 tablet by mouth as need for palpitations or Afib up to 4 times a day Patient not taking: Reported on 03/23/2023 09/01/22   Swinyer, Zachary George, NP  ELIQUIS 5 MG TABS tablet TAKE 1 TABLET(5 MG) BY MOUTH TWICE DAILY 10/22/22   Turner, Cornelious Bryant, MD  Evolocumab (REPATHA SURECLICK) 140 MG/ML SOAJ ADMINISTER 1 ML UNDER THE SKIN EVERY 14 DAYS 12/24/22   Quintella Reichert, MD  HYDROcodone bit-homatropine (HYDROMET) 5-1.5 MG/5ML syrup Take 5 mLs by mouth every 6 (six) hours as needed for cough. Patient not taking: Reported on 03/23/2023 02/25/23   Parrett, Virgel Bouquet, NP  metoprolol succinate (TOPROL-XL) 25 MG 24 hr tablet TAKE 1 TABLET(25 MG) BY MOUTH DAILY 12/06/22   Quintella Reichert, MD  omeprazole (PRILOSEC) 20 MG capsule Take 1 capsule (20 mg total) by mouth daily. 02/19/22   Glenford Bayley, NP  PARoxetine (PAXIL) 10 MG tablet Take 1 tablet (10 mg total) by mouth at bedtime. 01/11/23   Annett Fabian, MD  predniSONE (DELTASONE) 5 MG tablet Take 1 tablet (5 mg total) by mouth daily with breakfast. 08/18/22   Josph Macho, MD  TRELEGY ELLIPTA 100-62.5-25 MCG/ACT AEPB INHALE 1 PUFF INTO THE LUNGS DAILY 12/04/22   Leslye Peer, MD  prochlorperazine (COMPAZINE) 10 MG tablet Take 1 tablet (10 mg total) by mouth every 6 (six) hours as needed (Nausea or  vomiting). Patient not taking: Reported on 06/29/2021 01/29/21 07/20/21  Josph Macho, MD      Allergies    Albuterol and Atorvastatin    Review of Systems   Review of Systems A 10 point review of systems was performed and is negative unless otherwise reported in HPI.  Physical Exam Updated Vital Signs BP (!) 154/108 (BP Location: Left Arm)   Pulse 90   Temp 97.6 F (36.4 C) (Oral)   Resp (!) 22   Ht 6' (1.829 m)   Wt 115.7 kg   SpO2 99%   BMI 34.58 kg/m  Physical Exam General: Normal appearing obese male, lying in bed.  HEENT: Sclera anicteric, MMM, trachea midline.  Cardiology: Irregularly irregular tachycardia, no murmurs/rubs/gallops. BL radial and DP pulses equal bilaterally.  Resp: Normal respiratory rate and effort. CTAB, no wheezes, rhonchi, crackles.  Abd: Soft, non-tender, non-distended. No rebound tenderness or guarding.  Soft small reducible umbilical hernia. GU: Deferred. MSK: No peripheral edema or signs of trauma. Extremities without deformity or TTP. No cyanosis or clubbing. Skin: warm, dry.  Neuro: A&Ox4, CNs II-XII grossly intact. MAEs. Sensation grossly intact.  Psych: Normal mood and affect.   ED Results / Procedures / Treatments   Labs (all labs ordered are listed, but only abnormal results are displayed) Labs Reviewed  CBC WITH DIFFERENTIAL/PLATELET - Abnormal; Notable for the following components:      Result Value   Lymphs Abs 0.4 (*)    All other components within normal limits  COMPREHENSIVE METABOLIC PANEL - Abnormal; Notable for the following components:   Glucose, Bld 105 (*)    Calcium 8.5 (*)    All other components within normal limits  RESP PANEL BY RT-PCR (RSV, FLU A&B, COVID)  RVPGX2  MAGNESIUM  D-DIMER, QUANTITATIVE  TSH  T4, FREE    EKG EKG Interpretation Date/Time:  Monday March 28 2023 13:00:39 EST Ventricular Rate:  137 PR Interval:    QRS Duration:  96 QT Interval:  317 QTC Calculation: 479 R  Axis:   -71  Text Interpretation: Atrial fibrillation with rapid ventricular rate Ventricular premature complex Left anterior fascicular block Consider RVH w/ secondary repol abnormality Confirmed by Vivi Barrack [phone]) on 03/28/2023 1:04:45 PM  Radiology CXR: 1. No evidence of acute cardiopulmonary process. 2. Stable asymmetric right lung opacities attributed to treated lung cancer.  CT PE: Pending  Procedures Procedures    Medications Ordered in ED Medications  lactated ringers bolus 1,000 mL (1,000 mLs Intravenous New Bag/Given 03/28/23 1320)  diltiazem (CARDIZEM) injection 15 mg (15 mg Intravenous Given 03/28/23 1358)    ED Course/ Medical Decision Making/ A&P                          Medical Decision Making Amount and/or Complexity of Data Reviewed Labs: ordered. Decision-making  details documented in ED Course. Radiology: ordered. Decision-making details documented in ED Course.  Risk Prescription drug management.    This patient presents to the ED for concern of tachycardia, Afib w/ RVR, this involves an extensive number of treatment options, and is a complaint that carries with it a high risk of complications and morbidity.  I considered the following differential and admission for this acute, potentially life threatening condition. He has no chest pain and BP is stable on arrival.   MDM:    Patient presents w/ Afib w/ RVR. Is compliant with eliquis but hasn't had AF that he knows of for over a year. For patient's AF w/ RVR, offered him cardioversion vs IV medications, and patient prefers IV medications. He is given one bolus IV diltiazem which controls his rate, and subsequently is given 30 mg diltiazem PO per his PRN home meds. Patient had not taken any PRN diltiazem today. He feels much improved after these treatments.  As for a cause for his Afib, considered broad differential. Always feels short of breath, and CXR doesn't show any PNA/pulm edema. Must consider PE  with malignancy and SOB, Afib, dimer is positive and will get CT PE. No electrolyte derangements, anemia, or significant dehydration. Considered viral infection as well, but covid/flu/rsv negative. No chest pain to indicate ACS. Also considered thyroid abnormalities after h/o taking amiodarone, though he no longer takes it. TSH is noted to be low, which could indicate hyperthyroidism, pending FT4. He has not had any fevers, diarrhea, or weight loss. No c/f thyroid storm.    Clinical Course as of 03/30/23 2035  Mon Mar 28, 2023  1328 CBC with Differential(!) Unremarkable in the context of this patient's presentation  [HN]  1352 Magnesium: 1.8 wnl [HN]  1352 Comprehensive metabolic panel(!) wnl [HN]  1534 DG Chest Portable 1 View 1. No evidence of acute cardiopulmonary process. 2. Stable asymmetric right lung opacities attributed to treated lung cancer.   [HN]  1534 D-Dimer, Quant(!): 2.70 Elevated, will obtain CT PE [HN]  1534 Resp panel by RT-PCR (RSV, Flu A&B, Covid) Anterior Nasal Swab neg [HN]  1535 TSH(!): <0.010 Very low TSH [HN]  1535 Pt's heart rate controlled in 90s bpm after one does IV push diltiazem. Will give 30 mg PO diltiazem for AF per home rx.  [HN]    Clinical Course User Index [HN] Loetta Rough, MD    Labs: I Ordered, and personally interpreted labs.  The pertinent results include:  those listed above  Imaging Studies ordered: I ordered imaging studies including CXR, CT PE I independently visualized and interpreted imaging. I agree with the radiologist interpretation  Additional history obtained from chart review, wife at bedside.   Cardiac Monitoring: The patient was maintained on a cardiac monitor.  I personally viewed and interpreted the cardiac monitored which showed an underlying rhythm of: Afib w/ RVR  Reevaluation: After the interventions noted above, I reevaluated the patient and found that they have :improved  Social Determinants of  Health: Lives independently  Disposition:  Patient is signed out to the oncoming ED physician Dr. Particia Nearing who is made aware of his history, presentation, exam, workup, and plan.    Co morbidities that complicate the patient evaluation  Past Medical History:  Diagnosis Date   Arthritis    Rheumatoid   Bacteremia due to Pseudomonas    C. difficile colitis    Chronic anxiety 11/08/2014   Cough 09/22/2021   COVID 2020   mild case  Depression    Drug-induced neutropenia (HCC) 08/11/2020   Family history of adverse reaction to anesthesia    brother with seizures had episode under anesthesia.  Patient has seizures as well.   GERD (gastroesophageal reflux disease)    History of radiation therapy 04/24/2020-05/16/2020   IMRT to right lung     Dr Antony Blackbird   History of radiation therapy    08/10/21-08/19/21-Dr. Antony Blackbird   Neutropenia [facility] 05/12/2020   Neutropenic fever (HCC) 05/12/2020   Normocytic anemia 04/18/2021   PAF (paroxysmal atrial fibrillation) (HCC)    CHADS2VSAC score 3   Rheumatoid aortitis    Secondary hypercoagulable state (HCC) 10/28/2020   Sepsis (HCC) 10/06/2020   Shortness of breath 12/18/2020   Squamous cell lung cancer (HCC)      Medicines Meds ordered this encounter  Medications   lactated ringers bolus 1,000 mL   diltiazem (CARDIZEM) injection 15 mg    I have reviewed the patients home medicines and have made adjustments as needed  Problem List / ED Course: Problem List Items Addressed This Visit   None Visit Diagnoses       Atrial fibrillation with RVR (HCC)    -  Primary   Relevant Medications   diltiazem (CARDIZEM) injection 15 mg (Completed)   diltiazem (CARDIZEM) tablet 30 mg (Completed)                   This note was created using dictation software, which may contain spelling or grammatical errors.    Loetta Rough, MD 03/30/23 2041

## 2023-03-29 ENCOUNTER — Ambulatory Visit
Admission: RE | Admit: 2023-03-29 | Discharge: 2023-03-29 | Disposition: A | Discharge status: Home / Self Care | Payer: Managed Care, Other (non HMO) | Source: Ambulatory Visit | Attending: Radiation Oncology | Admitting: Radiation Oncology

## 2023-03-29 ENCOUNTER — Other Ambulatory Visit: Payer: Self-pay

## 2023-03-29 ENCOUNTER — Telehealth: Payer: Self-pay | Admitting: Cardiology

## 2023-03-29 DIAGNOSIS — I4819 Other persistent atrial fibrillation: Secondary | ICD-10-CM

## 2023-03-29 DIAGNOSIS — C7971 Secondary malignant neoplasm of right adrenal gland: Secondary | ICD-10-CM

## 2023-03-29 LAB — RAD ONC ARIA SESSION SUMMARY
Course Elapsed Days: 8
Plan Fractions Treated to Date: 4
Plan Prescribed Dose Per Fraction: 8 Gy
Plan Total Fractions Prescribed: 5
Plan Total Prescribed Dose: 40 Gy
Reference Point Dosage Given to Date: 32 Gy
Reference Point Session Dosage Given: 8 Gy
Session Number: 4

## 2023-03-29 NOTE — Telephone Encounter (Signed)
Pt called in stating he was in the ED for afib. He asked if Dr. Mayford Knife has any suggestions or any med changes.

## 2023-03-29 NOTE — Telephone Encounter (Signed)
See other telephone encounter from 03/29/23.

## 2023-03-29 NOTE — Telephone Encounter (Signed)
Returned patient's call regarding symptoms. Patient explained that he had been in the ER yesterday for afib and was discharged. He states he is still in afib and does not feel well. He was offered a cardioversion but he felt that he has had them in the past and they usually don't resolve the problem for very long. He says that he has been on amiodarone before and is wondering if he can take that at home, but was told that due to his lung disease and lung cancer, he probably cannot take the amiodarone. I noted that patient was SOB as he was speaking. He states that he is short of breath at baseline due to his lung cancer and states he has a lung cancer treatment later this week. He states that he has had afib before when he is undergoing cancer treatment and it makes him feel "horrible".  ED notes from yesterday state patient is to return to ED if symptoms worsen, advised patient to go back to ED due to the discomfort and SOB he is experiencing, as well as due to the complexity of treatment given that he also has lung cancer. Patient verbalized understanding and agrees to plan.

## 2023-03-29 NOTE — Telephone Encounter (Signed)
Called was transferred to me, pt stated that he is a cancer patient and has had 5 round of Chemo.  He stated he was in the [employer], he feel like he is in afib and he was not advised what to do to help,  He having Sob when he trying to walk and he dizzy.  He stated it has gotten worse from yesterday.    Best number [phone]

## 2023-03-29 NOTE — Telephone Encounter (Signed)
Patient c/o Palpitations:  STAT if patient reporting lightheadedness, shortness of breath, or chest pain  How long have you had palpitations/irregular HR/ Afib? Are you having the symptoms now? Last night in ER; yes   Are you currently experiencing lightheadedness, SOB or CP? Lightheadedness, SOB  Do you have a history of afib (atrial fibrillation) or irregular heart rhythm? Yes   Have you checked your BP or HR? (document readings if available): 94/68; 133  Are you experiencing any other symptoms? No

## 2023-03-30 ENCOUNTER — Encounter (HOSPITAL_COMMUNITY): Payer: Self-pay

## 2023-03-30 ENCOUNTER — Ambulatory Visit: Payer: Managed Care, Other (non HMO) | Attending: Cardiology | Admitting: Cardiology

## 2023-03-30 ENCOUNTER — Encounter: Payer: Self-pay | Admitting: Cardiology

## 2023-03-30 VITALS — BP 104/68 | HR 81

## 2023-03-30 DIAGNOSIS — I4819 Other persistent atrial fibrillation: Secondary | ICD-10-CM | POA: Diagnosis not present

## 2023-03-30 MED ORDER — AMIODARONE HCL 200 MG PO TABS: 200.0000 mg | ORAL_TABLET | Freq: Every day | ORAL | 3 refills | Status: DC

## 2023-03-30 NOTE — Patient Instructions (Addendum)
Medication Instructions:  Your physician has recommended you make the following change in your medication:  1) STOP taking diltiazem 2) START taking amiodarone 200 mg daily  *If you need a refill on your cardiac medications before your next appointment, please call your pharmacy  Follow-Up: At [facility], you and your health needs are our priority.  As part of our continuing mission to provide you with exceptional heart care, we have created designated Provider Care Teams.  These Care Teams include your primary Cardiologist (physician) and Advanced Practice Providers (APPs -  Physician Assistants and Nurse Practitioners) who all work together to provide you with the care you need, when you need it.  Your next appointment:   You will be directly admitted to [facility] tomorrow, February 13th - you will be called when there is a bed available.

## 2023-03-30 NOTE — Telephone Encounter (Signed)
Discussed w/ Dr. Mayford Knife. Pt scheduled to see Dr. Lalla Brothers this afternoon in consult for his afib. Pt agreeable to  plan.

## 2023-03-30 NOTE — Addendum Note (Signed)
Addended by: Baird Lyons on: 03/30/2023 11:37 AM   Modules accepted: Orders

## 2023-03-30 NOTE — Progress Notes (Signed)
Electrophysiology Office Note:    Date:  03/30/2023   ID:  John [name], DOB [birth year], MRN [id number]  [facility] Cardiologist:  Armanda Magic, MD  [facility] Electrophysiologist:  None   Referring MD: Annett Fabian, MD   Chief Complaint: Atrial fibrillation  History of Present Illness:    John [name] is a 63 year old man who I am seeing today for an evaluation of atrial fibrillation at the request of Dr. Mayford Knife. The patient has a history of metastatic squamous cell carcinoma of the right lung with adrenal mets.  He is currently receiving chemotherapy.  The patient has a history of coronary artery disease, hyperlipidemia, persistent atrial fibrillation.  He has seen cardiology in the past with a visit in September 2024 with Michelle's 1 year.  He is on Eliquis for stroke prophylaxis.  He has had cardioversions in the past.   He was seen in the emergency department March 28, 2023.  He tells me that he was offered cardioversion but declined.  He wanted to discuss restarting amiodarone.  Since that time he has self started his amiodarone at home.  He has been taking 200 mg by mouth once daily.  He is very concerned about the past experience with amiodarone that caused significant hypotension.  He is slated to receive his last radiation treatment tomorrow morning at 8:50 AM.  He feels very lethargic while in atrial fibrillation and wants any help he can get.  He has been taking Eliquis without any missed doses.        Their past medical, social and family history was reviewed.   ROS:   Please see the history of present illness.    All other systems reviewed and are negative.  EKGs/Labs/Other Studies Reviewed:    The following studies were reviewed today:  August 02, 2021 echo EF 60 to 65% RV function not well-visualized No MR   September 18, 2022 CT 73rd percentile coronary artery calcium score, 165  March 28, 2023 EKG shows atrial fibrillation/flutter.   Ventricular rate of 137 bpm  Today's EKG shows atrial fibrillation with rapid ventricular rate       Physical Exam:    VS:  BP 104/68   Pulse 81   SpO2 94%     Wt Readings from Last 3 Encounters:  03/28/23 255 lb (115.7 kg)  03/23/23 255 lb 6.4 oz (115.8 kg)  03/02/23 256 lb (116.1 kg)     GEN: Uncomfortable appearing, mild distress CARD: Irregularly irregular, No MRG RESP: No IWOB. CTAB.        ASSESSMENT AND PLAN:    1. Persistent atrial fibrillation (HCC)     #Persistent atrial fibrillation Highly symptomatic.  Poor ventricular rate control.  Recent hospitalization 2 days ago for the same.  On Eliquis for stroke prophylaxis.  He is taken amiodarone in the past. I long discussion with the patient today about treatment options.  We could start amiodarone at home and plan for outpatient cardioversion in 2 to 3 weeks.  We could also move towards inpatient admission for IV amiodarone loading followed by cardioversion in the next few days.  He would like to proceed with inpatient hospitalization.  We will plan for direct admission tomorrow after his radiation treatment.  For now I have advised him to continue taking amiodarone 200 mg by mouth daily.  I discussed amiodarone risks in detail with the patient including the risk of liver, thyroid and lung toxicities.  We have discussed how his history of lung disease  influences his risk of developing pulmonary toxicity.  He understands that he has limited options outside of amiodarone and wishes to proceed with this therapy.  #Hypothyroidism This is going to complicate his atrial fibrillation management.  Will plan to start methimazole as an inpatient along with propranolol.  Will ask for our medicine colleagues to be involved.  #Metastatic squamous cell carcinoma of the lung    Total time of encounter: 60 minutes total time of encounter, including face-to-face patient care, coordination of care and counseling regarding high  complexity medical decision making re: AF.     Signed, Sheria Lang T. Lalla Brothers, MD, [facility], [facility] 03/30/2023 7:26 PM    Electrophysiology [facility]

## 2023-03-30 NOTE — Telephone Encounter (Signed)
Left message to call back

## 2023-03-31 ENCOUNTER — Encounter (HOSPITAL_COMMUNITY): Payer: Self-pay | Admitting: Cardiovascular Disease

## 2023-03-31 ENCOUNTER — Other Ambulatory Visit: Payer: Self-pay

## 2023-03-31 ENCOUNTER — Ambulatory Visit: Payer: Managed Care, Other (non HMO)

## 2023-03-31 ENCOUNTER — Inpatient Hospital Stay (HOSPITAL_COMMUNITY)
Admission: AD | Admit: 2023-03-31 | Discharge: 2023-04-01 | DRG: 309 | Disposition: A | Discharge status: Home / Self Care | Payer: Managed Care, Other (non HMO) | Source: Ambulatory Visit | Attending: Cardiology | Admitting: Cardiology

## 2023-03-31 ENCOUNTER — Ambulatory Visit
Admission: RE | Admit: 2023-03-31 | Discharge: 2023-03-31 | Disposition: A | Discharge status: Home / Self Care | Payer: Managed Care, Other (non HMO) | Source: Ambulatory Visit | Attending: Radiation Oncology | Admitting: Radiation Oncology

## 2023-03-31 DIAGNOSIS — I700 Atherosclerosis of aorta: Secondary | ICD-10-CM | POA: Diagnosis present

## 2023-03-31 DIAGNOSIS — C3401 Malignant neoplasm of right main bronchus: Secondary | ICD-10-CM | POA: Diagnosis not present

## 2023-03-31 DIAGNOSIS — J984 Other disorders of lung: Secondary | ICD-10-CM

## 2023-03-31 DIAGNOSIS — Z79899 Other long term (current) drug therapy: Secondary | ICD-10-CM | POA: Diagnosis not present

## 2023-03-31 DIAGNOSIS — Z888 Allergy status to other drugs, medicaments and biological substances status: Secondary | ICD-10-CM | POA: Diagnosis not present

## 2023-03-31 DIAGNOSIS — M069 Rheumatoid arthritis, unspecified: Secondary | ICD-10-CM

## 2023-03-31 DIAGNOSIS — K219 Gastro-esophageal reflux disease without esophagitis: Secondary | ICD-10-CM | POA: Diagnosis present

## 2023-03-31 DIAGNOSIS — C7971 Secondary malignant neoplasm of right adrenal gland: Secondary | ICD-10-CM

## 2023-03-31 DIAGNOSIS — Z7952 Long term (current) use of systemic steroids: Secondary | ICD-10-CM

## 2023-03-31 DIAGNOSIS — I2510 Atherosclerotic heart disease of native coronary artery without angina pectoris: Secondary | ICD-10-CM | POA: Diagnosis present

## 2023-03-31 DIAGNOSIS — E782 Mixed hyperlipidemia: Secondary | ICD-10-CM | POA: Diagnosis not present

## 2023-03-31 DIAGNOSIS — J704 Drug-induced interstitial lung disorders, unspecified: Secondary | ICD-10-CM | POA: Diagnosis present

## 2023-03-31 DIAGNOSIS — E039 Hypothyroidism, unspecified: Secondary | ICD-10-CM | POA: Diagnosis present

## 2023-03-31 DIAGNOSIS — Z8616 Personal history of COVID-19: Secondary | ICD-10-CM

## 2023-03-31 DIAGNOSIS — T45AX5A Adverse effect of immune checkpoint inhibitors and immunostimulant drugs, initial encounter: Secondary | ICD-10-CM | POA: Diagnosis present

## 2023-03-31 DIAGNOSIS — F419 Anxiety disorder, unspecified: Secondary | ICD-10-CM | POA: Diagnosis present

## 2023-03-31 DIAGNOSIS — Z8249 Family history of ischemic heart disease and other diseases of the circulatory system: Secondary | ICD-10-CM

## 2023-03-31 DIAGNOSIS — J9611 Chronic respiratory failure with hypoxia: Secondary | ICD-10-CM

## 2023-03-31 DIAGNOSIS — T50905A Adverse effect of unspecified drugs, medicaments and biological substances, initial encounter: Secondary | ICD-10-CM | POA: Diagnosis not present

## 2023-03-31 DIAGNOSIS — E0590 Thyrotoxicosis, unspecified without thyrotoxic crisis or storm: Secondary | ICD-10-CM | POA: Diagnosis present

## 2023-03-31 DIAGNOSIS — Z6834 Body mass index (BMI) 34.0-34.9, adult: Secondary | ICD-10-CM | POA: Diagnosis not present

## 2023-03-31 DIAGNOSIS — G40A01 Absence epileptic syndrome, not intractable, with status epilepticus: Secondary | ICD-10-CM | POA: Diagnosis present

## 2023-03-31 DIAGNOSIS — C3411 Malignant neoplasm of upper lobe, right bronchus or lung: Secondary | ICD-10-CM | POA: Diagnosis present

## 2023-03-31 DIAGNOSIS — Z7901 Long term (current) use of anticoagulants: Secondary | ICD-10-CM | POA: Diagnosis not present

## 2023-03-31 DIAGNOSIS — I480 Paroxysmal atrial fibrillation: Secondary | ICD-10-CM | POA: Diagnosis present

## 2023-03-31 DIAGNOSIS — Z9849 Cataract extraction status, unspecified eye: Secondary | ICD-10-CM | POA: Diagnosis not present

## 2023-03-31 DIAGNOSIS — E669 Obesity, unspecified: Secondary | ICD-10-CM | POA: Diagnosis present

## 2023-03-31 DIAGNOSIS — Z87891 Personal history of nicotine dependence: Secondary | ICD-10-CM

## 2023-03-31 DIAGNOSIS — I4819 Other persistent atrial fibrillation: Secondary | ICD-10-CM | POA: Diagnosis present

## 2023-03-31 DIAGNOSIS — Z923 Personal history of irradiation: Secondary | ICD-10-CM

## 2023-03-31 DIAGNOSIS — I4891 Unspecified atrial fibrillation: Secondary | ICD-10-CM | POA: Diagnosis present

## 2023-03-31 DIAGNOSIS — C3490 Malignant neoplasm of unspecified part of unspecified bronchus or lung: Secondary | ICD-10-CM | POA: Diagnosis present

## 2023-03-31 DIAGNOSIS — E66811 Obesity, class 1: Secondary | ICD-10-CM | POA: Diagnosis present

## 2023-03-31 DIAGNOSIS — J4489 Other specified chronic obstructive pulmonary disease: Secondary | ICD-10-CM

## 2023-03-31 DIAGNOSIS — Z7951 Long term (current) use of inhaled steroids: Secondary | ICD-10-CM

## 2023-03-31 LAB — RAD ONC ARIA SESSION SUMMARY
Course Elapsed Days: 10
Plan Fractions Treated to Date: 5
Plan Prescribed Dose Per Fraction: 8 Gy
Plan Total Fractions Prescribed: 5
Plan Total Prescribed Dose: 40 Gy
Reference Point Dosage Given to Date: 40 Gy
Reference Point Session Dosage Given: 8 Gy
Session Number: 5

## 2023-03-31 MED ORDER — ACETAMINOPHEN 325 MG PO TABS: 650.0000 mg | ORAL_TABLET | ORAL | Status: DC | PRN

## 2023-03-31 MED ORDER — APIXABAN 5 MG PO TABS
5.0000 mg | ORAL_TABLET | Freq: Two times a day (BID) | ORAL | Status: DC
  Administered 2023-03-31 – 2023-04-01 (×2): 5 mg via ORAL
  Filled 2023-03-31 (×2): qty 1

## 2023-03-31 MED ORDER — AMIODARONE LOAD VIA INFUSION
150.0000 mg | Freq: Once | INTRAVENOUS | Status: AC
  Administered 2023-03-31: 150 mg via INTRAVENOUS
  Filled 2023-03-31: qty 83.34

## 2023-03-31 MED ORDER — UMECLIDINIUM BROMIDE 62.5 MCG/ACT IN AEPB
1.0000 | INHALATION_SPRAY | Freq: Every day | RESPIRATORY_TRACT | Status: DC
  Filled 2023-03-31: qty 7

## 2023-03-31 MED ORDER — AMIODARONE HCL IN DEXTROSE 360-4.14 MG/200ML-% IV SOLN
60.0000 mg/h | INTRAVENOUS | Status: DC
  Administered 2023-03-31 (×2): 60 mg/h via INTRAVENOUS
  Filled 2023-03-31 (×2): qty 200

## 2023-03-31 MED ORDER — AMIODARONE HCL 200 MG PO TABS: 200.0000 mg | ORAL_TABLET | Freq: Two times a day (BID) | ORAL | Status: DC

## 2023-03-31 MED ORDER — METOPROLOL SUCCINATE ER 25 MG PO TB24
25.0000 mg | ORAL_TABLET | Freq: Every day | ORAL | Status: DC
  Administered 2023-04-01: 25 mg via ORAL
  Filled 2023-03-31: qty 1

## 2023-03-31 MED ORDER — FLUTICASONE FUROATE-VILANTEROL 100-25 MCG/ACT IN AEPB
1.0000 | INHALATION_SPRAY | Freq: Every day | RESPIRATORY_TRACT | Status: DC
  Filled 2023-03-31: qty 28

## 2023-03-31 MED ORDER — NITROGLYCERIN 0.4 MG SL SUBL: 0.4000 mg | SUBLINGUAL_TABLET | SUBLINGUAL | Status: DC | PRN

## 2023-03-31 MED ORDER — PREDNISONE 5 MG PO TABS
5.0000 mg | ORAL_TABLET | Freq: Every day | ORAL | Status: DC
  Administered 2023-04-01: 5 mg via ORAL
  Filled 2023-03-31: qty 1

## 2023-03-31 MED ORDER — AMIODARONE HCL IN DEXTROSE 360-4.14 MG/200ML-% IV SOLN
30.0000 mg/h | INTRAVENOUS | Status: DC
  Administered 2023-04-01: 30 mg/h via INTRAVENOUS
  Filled 2023-03-31: qty 200

## 2023-03-31 MED ORDER — PAROXETINE HCL 10 MG PO TABS
10.0000 mg | ORAL_TABLET | Freq: Every day | ORAL | Status: DC
  Administered 2023-03-31: 10 mg via ORAL
  Filled 2023-03-31 (×2): qty 1

## 2023-03-31 NOTE — Plan of Care (Signed)
Problem: Education: Goal: Knowledge of disease or condition will improve Outcome: Progressing Goal: Understanding of medication regimen will improve Outcome: Progressing   Problem: Activity: Goal: Ability to tolerate increased activity will improve Outcome: Progressing   Problem: Cardiac: Goal: Ability to achieve and maintain adequate cardiopulmonary perfusion will improve Outcome: Progressing   Problem: Health Behavior/Discharge Planning: Goal: Ability to safely manage health-related needs after discharge will improve Outcome: Progressing

## 2023-03-31 NOTE — Consult Note (Signed)
Initial Consultation Note   Patient: John [name] WJX:[id number] DOB: [birth year] PCP: Annett Fabian, MD DOA: 03/31/2023 DOS: the patient was seen and examined on 03/31/2023 Primary service: Lanier Prude, MD  Referring physician: Lanier Prude, MD Reason for consult: New hyperthyroidism  Chief Complaint: Symptomatic atrial fibrillation with intermittent RVR  HPI/course: Kedric Bumgarner is a 63 y.o. 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  Patient has had ongoing issues with his atrial fibrillation.  Recently seen for RVR in the ED 3 days ago and was also cardioversion at that time but declined.  He improved with a diltiazem bolus and was discharged home on his home diltiazem, metoprolol and Eliquis.  He subsequently started taking amiodarone on his own that he had leftover from prior to it being discontinued.  He followed up with his EP provider yesterday and after discussion opted for inpatient IV amiodarone administration with plan for cardioversion inpatient.  He was direct admitted today after his most recent radiation treatment for his malignancy.  Denies fevers, chills, chest pain, short of breath, abdominal pain, constipation, diarrhea, nausea, vomiting   Vital signs stable.  Recent TSH 0.01, recent T42.09.  No labs today so far.  Review of Systems: As per HPI otherwise all other systems reviewed and are negative.  Past Medical History:  Diagnosis Date   Arthritis    Rheumatoid   Bacteremia due to Pseudomonas    C. difficile colitis    Chronic anxiety 11/08/2014   Cough 09/22/2021   COVID 2020   mild case   Depression    Drug-induced neutropenia (HCC) 08/11/2020   Family history of  adverse reaction to anesthesia    brother with seizures had episode under anesthesia.  Patient has seizures as well.   GERD (gastroesophageal reflux disease)    History of radiation therapy 04/24/2020-05/16/2020   IMRT to right lung     Dr Antony Blackbird   History of radiation therapy    08/10/21-08/19/21-Dr. Antony Blackbird   Neutropenia [facility]) 05/12/2020   Neutropenic fever (HCC) 05/12/2020   Normocytic anemia 04/18/2021   PAF (paroxysmal atrial fibrillation) (HCC)    CHADS2VSAC score 3   Rheumatoid aortitis    Secondary hypercoagulable state (HCC) 10/28/2020   Sepsis (HCC) 10/06/2020   Shortness of breath 12/18/2020   Squamous cell lung cancer [facility])     Past Surgical History:  Procedure Laterality Date   BRONCHIAL BRUSHINGS  03/27/2020   Procedure: BRONCHIAL BRUSHINGS;  Surgeon: Leslye Peer, MD;  Location: [facility] ENDOSCOPY;  Service: Cardiopulmonary;;   BUBBLE STUDY  10/13/2020   Procedure: BUBBLE STUDY;  Surgeon: Chrystie Nose, MD;  Location: [facility] ENDOSCOPY;  Service: Cardiovascular;;   CARDIOVERSION N/A 10/13/2020   Procedure: CARDIOVERSION;  Surgeon: Chrystie Nose, MD;  Location: [facility] ENDOSCOPY;  Service: Cardiovascular;  Laterality: N/A;   CATARACT EXTRACTION  2016   at [facility]   FINE NEEDLE ASPIRATION  03/27/2020   Procedure: FINE NEEDLE ASPIRATION;  Surgeon: Leslye Peer, MD;  Location: [facility] ENDOSCOPY;  Service: Cardiopulmonary;;   HIP SURGERY Left    IR KYPHO EA ADDL LEVEL THORACIC OR LUMBAR  10/12/2021   IR KYPHO LUMBAR INC FX REDUCE BONE BX UNI/BIL CANNULATION INC/IMAGING  10/12/2021   IR KYPHO THORACIC WITH BONE BIOPSY  09/07/2021   IR RADIOLOGIST EVAL &  MGMT  09/03/2021   RADIOLOGY WITH ANESTHESIA N/A 10/12/2021   Procedure: L1 and L3 Kyphoplasty;  Surgeon: Baldemar Lenis, MD;  Location: [facility] OR;  Service: Radiology;  Laterality: N/A;   TEE WITHOUT CARDIOVERSION N/A 10/13/2020   Procedure: TRANSESOPHAGEAL ECHOCARDIOGRAM (TEE);  Surgeon: Chrystie Nose, MD;  Location:  [facility] ENDOSCOPY;  Service: Cardiovascular;  Laterality: N/A;   VIDEO BRONCHOSCOPY WITH ENDOBRONCHIAL ULTRASOUND N/A 03/27/2020   Procedure: VIDEO BRONCHOSCOPY WITH ENDOBRONCHIAL ULTRASOUND;  Surgeon: Leslye Peer, MD;  Location: [facility] ENDOSCOPY;  Service: Cardiopulmonary;  Laterality: N/A;    Social History  reports that he quit smoking about 7 years ago. His smoking use included cigarettes. He started smoking about 37 years ago. He has a 30 pack-year smoking history. He has never used smokeless tobacco. He reports that he does not currently use alcohol. He reports that he does not use drugs.  Allergies  Allergen Reactions   Ventolin [Albuterol] Other (See Comments)    Patient has had episode of atrial fib following administration of albuterol (tolerates Xopenex well)   Lipitor [Atorvastatin] Other (See Comments)    Arthralgias     Family History  Problem Relation Age of Onset   Arrhythmia Mother        has PPM   Heart disease Father        Died at 33, started in his 34s, heart attacks, had PPM and ICD   Prior to Admission medications   Medication Sig Start Date End Date Taking? Authorizing Provider  acetaminophen (TYLENOL) 500 MG tablet Take 500 mg by mouth daily as needed for moderate pain (pain score 4-6), fever or headache.   Yes [provider]  amiodarone (PACERONE) 200 MG tablet Take 1 tablet (200 mg total) by mouth daily. 03/30/23  Yes Lanier Prude, MD  ELIQUIS 5 MG TABS tablet TAKE 1 TABLET(5 MG) BY MOUTH TWICE DAILY 10/22/22  Yes Turner, Cornelious Bryant, MD  Evolocumab (REPATHA SURECLICK) 140 MG/ML SOAJ ADMINISTER 1 ML UNDER THE SKIN EVERY 14 DAYS 12/24/22  Yes Turner, Cornelious Bryant, MD  metoprolol succinate (TOPROL-XL) 25 MG 24 hr tablet TAKE 1 TABLET(25 MG) BY MOUTH DAILY 12/06/22  Yes Turner, Traci R, MD  PARoxetine (PAXIL) 10 MG tablet Take 1 tablet (10 mg total) by mouth at bedtime. 01/11/23  Yes Annett Fabian, MD  predniSONE (DELTASONE) 5 MG tablet Take 1 tablet (5 mg total)  by mouth daily with breakfast. 08/18/22  Yes Ennever, Rose Phi, MD  TRELEGY ELLIPTA 100-62.5-25 MCG/ACT AEPB INHALE 1 PUFF INTO THE LUNGS DAILY 12/04/22  Yes Leslye Peer, MD  prochlorperazine (COMPAZINE) 10 MG tablet Take 1 tablet (10 mg total) by mouth every 6 (six) hours as needed (Nausea or vomiting). Patient not taking: Reported on 06/29/2021 01/29/21 07/20/21  Josph Macho, MD    Physical Exam: Vitals:   03/31/23 1556  BP: 113/73  Pulse: 85  Resp: 16  Temp: 98.5 F (36.9 C)  TempSrc: Oral  SpO2: 94%    Physical Exam Constitutional:      General: He is not in acute distress.    Appearance: Normal appearance.  HENT:     Head: Normocephalic and atraumatic.     Mouth/Throat:     Mouth: Mucous membranes are moist.     Pharynx: Oropharynx is clear.  Eyes:     Extraocular Movements: Extraocular movements intact.     Pupils: Pupils are equal, round, and reactive to light.  Cardiovascular:     Rate  and Rhythm: Normal rate and regular rhythm.     Pulses: Normal pulses.     Heart sounds: Normal heart sounds.  Pulmonary:     Effort: Pulmonary effort is normal. No respiratory distress.     Breath sounds: Normal breath sounds.  Abdominal:     General: Bowel sounds are normal. There is no distension.     Palpations: Abdomen is soft.     Tenderness: There is no abdominal tenderness.  Musculoskeletal:        General: No swelling or deformity.  Skin:    General: Skin is warm and dry.  Neurological:     General: No focal deficit present.     Mental Status: Mental status is at baseline.    Labs on Admission: I have personally reviewed following labs and imaging studies  CBC: Recent Labs  Lab 03/28/23 1313  WBC 4.0  NEUTROABS 2.8  HGB 13.3  HCT 41.8  MCV 88.7  PLT 198    Basic Metabolic Panel: Recent Labs  Lab 03/28/23 1313  NA 135  K 4.0  CL 103  CO2 22  GLUCOSE 105*  BUN 16  CREATININE 1.04  CALCIUM 8.5*  MG 1.8    GFR: Estimated Creatinine  Clearance: 97.9 mL/min (by C-G formula based on SCr of 1.04 mg/dL).  Liver Function Tests: Recent Labs  Lab 03/28/23 1313  AST 18  ALT 13  ALKPHOS 61  BILITOT 1.2  PROT 7.6  ALBUMIN 3.7    Urine analysis:    Component Value Date/Time   COLORURINE YELLOW 08/23/2021 1929   APPEARANCEUR CLEAR 08/23/2021 1929   LABSPEC 1.020 08/23/2021 1929   PHURINE 6.0 08/23/2021 1929   GLUCOSEU NEGATIVE 08/23/2021 1929   HGBUR NEGATIVE 08/23/2021 1929   BILIRUBINUR NEGATIVE 08/23/2021 1929   KETONESUR NEGATIVE 08/23/2021 1929   PROTEINUR NEGATIVE 08/23/2021 1929   NITRITE NEGATIVE 08/23/2021 1929   LEUKOCYTESUR NEGATIVE 08/23/2021 1929    Radiological Exams on Admission: No results found.  EKG: Independently reviewed.  Sinus rhythm with first-degree AV block and PACs.  Nonspecific interventricular conduction delay with QRS 92 MS.  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 > Recent CT scan on 2/10 noted no thyroid mass.  No obvious clinical signs of brain disease. > Will evaluate for etiology and treat as appropriate.  Is already on beta-blocker. - Continue with home beta-blocker - Check T3 and evaluate for thyrotropin receptor antibodies with thyroid-stimulating immunoglobulin labs. - If antibody positive consistent with Graves' disease.  If antibodies  negative will need further workup with ultrasound versus radioiodine scan  Atrial fibrillation > Has had issues with RVR and symptoms. > He is admitted by EP for IV amiodarone and conversion - Management per primary team  Hyperlipidemia - On Repatha outpatient  CAD > By CT coronary study with minimal (0-24%) stenosis. 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  > Previously received Keytruda which was discontinued due to above pneumonitis.  Also previously received docetaxel which was held due to disease progression. > Current regimen is Xgeva.  Receiving radiation therapy of adrenal gland.  Had been receiving Gemzar/Navelbine but this is on hold. - Noted   Rheumatoid arthritis - Continue home prednisone  History of seizures > No antiepileptics for about 30 years per chart.   TRH will continue to follow the patient.   Synetta Fail MD [facility]

## 2023-03-31 NOTE — H&P (Signed)
Cardiology Admission History and Physical   Patient ID: John [name] MRN: [id number]; DOB: 1960-05-14   Admission date: 03/31/2023  PCP:  Annett Fabian, MD   [facility] Providers Cardiologist:  Armanda Magic, MD      Chief Complaint:  symptomatic AFib  Patient Profile:   John [name] is a 63 y.o. male with  Persistent atrial fibrillation on chronic anticoagulation Possible tumor pressing on PA Amiodarone discontinued due to concern for lung toxicity, restarted in setting of AF RVR and per pt request CHA2DS2-VASc score of 3 Non-small cell lung cancer with metastasis to adrenal gland On Keytruda Concern for pulmonary fibrosis with amiodarone and Keytruda Coronary calcification on CT/Aortic atherosclerosis Calcification in LAD and left circumflex seen on CT 11/2021 Aortic atherosclerosis No ASA due to DOAC Rheumatoid arthritis GERD Spontaneous pneumothorax 03/2021  AFib diagnosed 2022 Historically had been on amiodaron >. Stopped with pulomnoary concerns, ?hypotension Stopped several months ago with concerns of side effects  CHA2DS2Vasc is   who is being seen 03/31/2023 for the evaluation of AFib.  History of Present Illness:   [name] was referred to Dr. Lalla Brothers for recurrent, symptomatic Afib  Seen yesterday, the patient having been in the emergency department March 28, 2023.  He told Dr. Lalla Brothers that he was offered cardioversion but declined.  He wanted to discuss restarting amiodarone.  Since that time he had self started his amiodarone at home.  He has been taking 200 mg by mouth once daily.  He was very concerned about the past experience with amiodarone that caused significant hypotension.   Scheduled to receive his last radiation treatment 03/31/23 morning at 8:50 AM.   He reported feeling very lethargic while in atrial fibrillation and wanted any help he could get.  Pt confirmed no missed Eliquis doses Dr. Lalla Brothers discussed at length treatment  options.  -- Could start amiodarone at home and plan for outpatient cardioversion in 2 to 3 weeks.  -- Could also move towards inpatient admission for IV amiodarone loading followed by cardioversion in the next few days. He preferred to proceed with inpatient hospitalization.  Planned for direct admission after his radiation treatment 03/31/23.   Recommended that IM team be consulted to manage his hyperthyroidism >> apparently a new findings for him  In my discussion with Dr. Lalla Brothers, plans to proceed with amiodarone and treat his thyroid concomitantly   LABS 03/28/23 K+ 4.0 Mag 1.8 BUN./Creat 16/1.04 AST 18 ALT 13 WBC 4.0 H/H 13/41 Plts 198  Ddimer 2.70 TSH <0.010 Free T4 2.09  He arrives today in SR Reports his heart has been banging away for says, makes him feel poorly Thinks he probably just wen back into normal rhythm, an hour or so prior to arrival He is thrilled to be back  in SR   Past Medical History:  Diagnosis Date   Arthritis    Rheumatoid   Bacteremia due to Pseudomonas    C. difficile colitis    Chronic anxiety 11/08/2014   Cough 09/22/2021   COVID 2020   mild case   Depression    Drug-induced neutropenia (HCC) 08/11/2020   Family history of adverse reaction to anesthesia    brother with seizures had episode under anesthesia.  Patient has seizures as well.   GERD (gastroesophageal reflux disease)    History of radiation therapy 04/24/2020-05/16/2020   IMRT to right lung     Dr Antony Blackbird   History of radiation therapy    08/10/21-08/19/21-Dr. Antony Blackbird  Neutropenia (HCC) 05/12/2020   Neutropenic fever (HCC) 05/12/2020   Normocytic anemia 04/18/2021   PAF (paroxysmal atrial fibrillation) (HCC)    CHADS2VSAC score 3   Rheumatoid aortitis    Secondary hypercoagulable state (HCC) 10/28/2020   Sepsis (HCC) 10/06/2020   Shortness of breath 12/18/2020   Squamous cell lung cancer [facility])     Past Surgical History:  Procedure Laterality Date    BRONCHIAL BRUSHINGS  03/27/2020   Procedure: BRONCHIAL BRUSHINGS;  Surgeon: Leslye Peer, MD;  Location: [facility] ENDOSCOPY;  Service: Cardiopulmonary;;   BUBBLE STUDY  10/13/2020   Procedure: BUBBLE STUDY;  Surgeon: Chrystie Nose, MD;  Location: [facility] ENDOSCOPY;  Service: Cardiovascular;;   CARDIOVERSION N/A 10/13/2020   Procedure: CARDIOVERSION;  Surgeon: Chrystie Nose, MD;  Location: [facility] ENDOSCOPY;  Service: Cardiovascular;  Laterality: N/A;   CATARACT EXTRACTION  2016   at [facility]   FINE NEEDLE ASPIRATION  03/27/2020   Procedure: FINE NEEDLE ASPIRATION;  Surgeon: Leslye Peer, MD;  Location: [facility] ENDOSCOPY;  Service: Cardiopulmonary;;   HIP SURGERY Left    IR KYPHO EA ADDL LEVEL THORACIC OR LUMBAR  10/12/2021   IR KYPHO LUMBAR INC FX REDUCE BONE BX UNI/BIL CANNULATION INC/IMAGING  10/12/2021   IR KYPHO THORACIC WITH BONE BIOPSY  09/07/2021   IR RADIOLOGIST EVAL & MGMT  09/03/2021   RADIOLOGY WITH ANESTHESIA N/A 10/12/2021   Procedure: L1 and L3 Kyphoplasty;  Surgeon: Baldemar Lenis, MD;  Location: [facility] OR;  Service: Radiology;  Laterality: N/A;   TEE WITHOUT CARDIOVERSION N/A 10/13/2020   Procedure: TRANSESOPHAGEAL ECHOCARDIOGRAM (TEE);  Surgeon: Chrystie Nose, MD;  Location: [facility] ENDOSCOPY;  Service: Cardiovascular;  Laterality: N/A;   VIDEO BRONCHOSCOPY WITH ENDOBRONCHIAL ULTRASOUND N/A 03/27/2020   Procedure: VIDEO BRONCHOSCOPY WITH ENDOBRONCHIAL ULTRASOUND;  Surgeon: Leslye Peer, MD;  Location: [facility] ENDOSCOPY;  Service: Cardiopulmonary;  Laterality: N/A;     Medications Prior to Admission: Prior to Admission medications   Medication Sig Start Date End Date Taking? Authorizing Provider  amiodarone (PACERONE) 200 MG tablet Take 1 tablet (200 mg total) by mouth daily. 03/30/23   Lanier Prude, MD  benzonatate (TESSALON) 200 MG capsule Take 1 capsule (200 mg total) by mouth 3 (three) times daily as needed. Patient not taking: Reported on 03/30/2023 02/25/23 02/25/24  Parrett, Virgel Bouquet, NP  ELIQUIS 5 MG TABS tablet TAKE 1 TABLET(5 MG) BY MOUTH TWICE DAILY 10/22/22   Turner, Cornelious Bryant, MD  Evolocumab (REPATHA SURECLICK) 140 MG/ML SOAJ ADMINISTER 1 ML UNDER THE SKIN EVERY 14 DAYS 12/24/22   Quintella Reichert, MD  HYDROcodone bit-homatropine (HYDROMET) 5-1.5 MG/5ML syrup Take 5 mLs by mouth every 6 (six) hours as needed for cough. Patient not taking: Reported on 03/30/2023 02/25/23   Parrett, Virgel Bouquet, NP  metoprolol succinate (TOPROL-XL) 25 MG 24 hr tablet TAKE 1 TABLET(25 MG) BY MOUTH DAILY 12/06/22   Quintella Reichert, MD  omeprazole (PRILOSEC) 20 MG capsule Take 1 capsule (20 mg total) by mouth daily. 02/19/22   Glenford Bayley, NP  PARoxetine (PAXIL) 10 MG tablet Take 1 tablet (10 mg total) by mouth at bedtime. 01/11/23   Annett Fabian, MD  predniSONE (DELTASONE) 5 MG tablet Take 1 tablet (5 mg total) by mouth daily with breakfast. 08/18/22   Josph Macho, MD  TRELEGY ELLIPTA 100-62.5-25 MCG/ACT AEPB INHALE 1 PUFF INTO THE LUNGS DAILY 12/04/22   Leslye Peer, MD  prochlorperazine (COMPAZINE) 10 MG tablet Take 1 tablet (10  mg total) by mouth every 6 (six) hours as needed (Nausea or vomiting). Patient not taking: Reported on 06/29/2021 01/29/21 07/20/21  Josph Macho, MD     Allergies:    Allergies  Allergen Reactions   Albuterol Other (See Comments)    Patient has had episode of atrial fib following administration of albuterol (tolerates Xopenex well)   Atorvastatin Other (See Comments)    Causes arthritis pain    Social History:   Social History   Socioeconomic History   Marital status: Married    Spouse name: Not on file   Number of children: Not on file   Years of education: Not on file   Highest education level: Not on file  Occupational History   Not on file  Tobacco Use   Smoking status: Former    Current packs/day: 0.00    Average packs/day: 1 pack/day for 30.0 years (30.0 ttl pk-yrs)    Types: Cigarettes    Start date: 40    Quit date: 2018     Years since quitting: 7.1   Smokeless tobacco: Never   Tobacco comments:    Former smoker, quit 2018  Vaping Use   Vaping status: Never Used  Substance and Sexual Activity   Alcohol use: Not Currently   Drug use: Never   Sexual activity: Not Currently  Other Topics Concern   Not on file  Social History Narrative   Not on file   Social Drivers of Health   Financial Resource Strain: Not on file  Food Insecurity: No Food Insecurity (03/02/2023)   Hunger Vital Sign    Worried About Running Out of Food in the Last Year: Never true    Ran Out of Food in the Last Year: Never true  Transportation Needs: No Transportation Needs (03/02/2023)   PRAPARE - Administrator, Civil Service (Medical): No    Lack of Transportation (Non-Medical): No  Physical Activity: Not on file  Stress: Not on file  Social Connections: Moderately Integrated (03/16/2022)   Social Connection and Isolation Panel [NHANES]    Frequency of Communication with Friends and Family: More than three times a week    Frequency of Social Gatherings with Friends and Family: More than three times a week    Attends Religious Services: More than 4 times per year    Active Member of [employer] or Organizations: No    Attends Banker Meetings: Never    Marital Status: Married  Catering manager Violence: Not At Risk (03/02/2023)   Humiliation, Afraid, Rape, and Kick questionnaire    Fear of Current or Ex-Partner: No    Emotionally Abused: No    Physically Abused: No    Sexually Abused: No    Family History:   The patient's family history includes Arrhythmia in his mother; Heart disease in his father.    ROS:  Please see the history of present illness.  All other ROS reviewed and negative.     Physical Exam/Data:  There were no vitals filed for this visit. No intake or output data in the 24 hours ending 03/31/23 1600    03/28/2023    1:07 PM 03/23/2023    3:03 PM 03/02/2023    9:58 AM  Last 3 Weights   Weight (lbs) 255 lb 255 lb 6.4 oz 256 lb  Weight (kg) 115.667 kg 115.849 kg 116.121 kg     There is no height or weight on file to calculate BMI.  General:  Well  nourished, well developed, in no acute distress HEENT: normal Neck: no JVD Vascular: No carotid bruits   Cardiac:  RRR; no murmurs, gallops or rubs Lungs:  CTA b/l, no wheezing, rhonchi or rales  Abd: soft, nontender, no hepatomegaly  Ext: no edema Musculoskeletal:  No deformities, BUE and BLE strength normal and equal Skin: warm and dry  Neuro:  CNs 2-12 intact, no focal abnormalities noted Psych:  Normal affect    EKG:  The ECG that was done 03/30/23 was personally reviewed and demonstrates  AFib 107bpm  EKG here is SR, LAD, 1st degree AVblock 83bpm  TELE: SR 70's-80's  Relevant CV Studies:  08/02/21: TTE 1. Left ventricular ejection fraction, by estimation, is 60 to 65%. The  left ventricle has normal function. Left ventricular endocardial border  not optimally defined to evaluate regional wall motion even with definity  contrast. Left ventricular  diastolic function could not be evaluated.   2. Right ventricular systolic function was not well visualized. The right  ventricular size is not well visualized. Tricuspid regurgitation signal is  inadequate for assessing PA pressure.   3. The mitral valve is normal in structure. No evidence of mitral valve  regurgitation. No evidence of mitral stenosis.   4. The aortic valve is normal in structure. Aortic valve regurgitation is  not visualized. No aortic stenosis is present.   5. Aortic dilatation noted. There is mild dilatation of the aortic root,  measuring 40 mm.   6. The inferior vena cava is normal in size with greater than 50%  respiratory variability, suggesting right atrial pressure of 3 mmHg.    Laboratory Data:  High Sensitivity Troponin:  No results for input(s): "TROPONINIHS" in the last 720 hours.    Chemistry Recent Labs  Lab 03/28/23 1313  NA  135  K 4.0  CL 103  CO2 22  GLUCOSE 105*  BUN 16  CREATININE 1.04  CALCIUM 8.5*  MG 1.8  GFRNONAA >60  ANIONGAP 10    Recent Labs  Lab 03/28/23 1313  PROT 7.6  ALBUMIN 3.7  AST 18  ALT 13  ALKPHOS 61  BILITOT 1.2   Lipids No results for input(s): "CHOL", "TRIG", "HDL", "LABVLDL", "LDLCALC", "CHOLHDL" in the last 168 hours. Hematology Recent Labs  Lab 03/28/23 1313  WBC 4.0  RBC 4.71  HGB 13.3  HCT 41.8  MCV 88.7  MCH 28.2  MCHC 31.8  RDW 14.8  PLT 198   Thyroid  Recent Labs  Lab 03/28/23 1349  TSH <0.010*  FREET4 2.09*   BNPNo results for input(s): "BNP", "PROBNP" in the last 168 hours.  DDimer  Recent Labs  Lab 03/28/23 1349  DDIMER 2.70*     Radiology/Studies:  No results found.   Assessment and Plan:   Persistent Afib CHa2DS2Vasc is one (2 if considering NOD CAD by CT), on Eliquis Continue plan to load amiodarone  Hyperthyroid (new) Have asked hospitalist team to see him for thyroid management    Metastatic Lung ca Had his last XRT today   Risk Assessment/Risk Scores:    For questions or updates, please contact [facility] Please consult [email or web address] for contact info under     Signed, Sheilah Pigeon, PA-C  03/31/2023 4:00 PM

## 2023-03-31 NOTE — Plan of Care (Signed)
Problem: Education: Goal: Knowledge of General Education information will improve Description Including pain rating scale, medication(s)/side effects and non-pharmacologic comfort measures Outcome: Progressing

## 2023-04-01 ENCOUNTER — Other Ambulatory Visit (HOSPITAL_COMMUNITY): Payer: Self-pay

## 2023-04-01 ENCOUNTER — Inpatient Hospital Stay (HOSPITAL_COMMUNITY): Payer: Managed Care, Other (non HMO)

## 2023-04-01 ENCOUNTER — Encounter: Payer: Self-pay | Admitting: Internal Medicine

## 2023-04-01 ENCOUNTER — Telehealth: Payer: Self-pay | Admitting: *Deleted

## 2023-04-01 ENCOUNTER — Telehealth: Payer: Self-pay

## 2023-04-01 DIAGNOSIS — I4819 Other persistent atrial fibrillation: Secondary | ICD-10-CM | POA: Diagnosis not present

## 2023-04-01 DIAGNOSIS — I4891 Unspecified atrial fibrillation: Secondary | ICD-10-CM

## 2023-04-01 LAB — BASIC METABOLIC PANEL
Anion gap: 8 (ref 5–15)
BUN: 8 mg/dL (ref 8–23)
CO2: 24 mmol/L (ref 22–32)
Calcium: 8.1 mg/dL — ABNORMAL LOW (ref 8.9–10.3)
Chloride: 103 mmol/L (ref 98–111)
Creatinine, Ser: 1.3 mg/dL — ABNORMAL HIGH (ref 0.61–1.24)
GFR, Estimated: >60 mL/min (ref >60)
Glucose, Bld: 99 mg/dL (ref 70–99)
Potassium: 3.5 mmol/L (ref 3.5–5.1)
Sodium: 135 mmol/L (ref 135–145)

## 2023-04-01 LAB — CBC
HCT: 36.3 % — ABNORMAL LOW (ref 39.0–52.0)
Hemoglobin: 12 g/dL — ABNORMAL LOW (ref 13.0–17.0)
MCH: 28.8 pg (ref 26.0–34.0)
MCHC: 33.1 g/dL (ref 30.0–36.0)
MCV: 87.1 fL (ref 80.0–100.0)
Platelets: 174 10*3/uL (ref 150–400)
RBC: 4.17 MIL/uL — ABNORMAL LOW (ref 4.22–5.81)
RDW: 14.7 % (ref 11.5–15.5)
WBC: 2.8 10*3/uL — ABNORMAL LOW (ref 4.0–10.5)
nRBC: 0 % (ref 0.0–0.2)

## 2023-04-01 LAB — ECHOCARDIOGRAM COMPLETE: Area-P 1/2: 2.87 cm2

## 2023-04-01 MED ORDER — AMIODARONE HCL 200 MG PO TABS
400.0000 mg | ORAL_TABLET | Freq: Every day | ORAL | 3 refills | Status: DC
  Filled 2023-04-01: qty 90, 45d supply, fill #0

## 2023-04-01 MED ORDER — AMIODARONE HCL 200 MG PO TABS
400.0000 mg | ORAL_TABLET | Freq: Every day | ORAL | Status: DC
  Administered 2023-04-01: 400 mg via ORAL
  Filled 2023-04-01: qty 2

## 2023-04-01 MED ORDER — PERFLUTREN LIPID MICROSPHERE
1.0000 mL | INTRAVENOUS | Status: AC | PRN
  Administered 2023-04-01: 3 mL via INTRAVENOUS

## 2023-04-01 NOTE — Progress Notes (Signed)
   Rounding Note    Patient Name: Liban Guedes Date of Encounter: 04/01/2023  [facility] Cardiologist: Armanda Magic, MD   Subjective   No acute events overnight.  In sinus rhythm.  Vital Signs    Vitals:   03/31/23 2106 04/01/23 0039 04/01/23 0417 04/01/23 0822  BP: 105/77 99/69 107/69 114/75  Pulse: 78 80 76 74  Resp: 20 16 18 20   Temp: 98.1 F (36.7 C) 98.2 F (36.8 C) 98.4 F (36.9 C) 97.9 F (36.6 C)  TempSrc: Oral Oral Oral Oral  SpO2: 97% 96% 92% 95%    Intake/Output Summary (Last 24 hours) at 04/01/2023 1121 Last data filed at 04/01/2023 0954 Gross per 24 hour  Intake 766.31 ml  Output --  Net 766.31 ml      03/28/2023    1:07 PM 03/23/2023    3:03 PM 03/02/2023    9:58 AM  Last 3 Weights  Weight (lbs) 255 lb 255 lb 6.4 oz 256 lb  Weight (kg) 115.667 kg 115.849 kg 116.121 kg      Telemetry    Normal sinus rhythm- Personally Reviewed  ECG    Personally Reviewed  Physical Exam   GEN: No acute distress.   Cardiac: RRR, no murmurs, rubs, or gallops.  Respiratory: Clear to auscultation bilaterally. Psych: Normal affect   Assessment & Plan    #Persistent atrial fibrillation Improved.  Now in sinus rhythm. Stop IV amiodarone Continue amiodarone 400 mg by mouth daily for 10 days and then decrease to 200 mg by mouth daily thereafter. Discussed pros/cons of amiodarone in detail including worsening thyroid function. Plan to manage thyroid and AF simultaneously to avoid his debilitating symptoms a/w AF w/ RVR. Will need repeat CMP, TSH and free T4 in 6 to 8 weeks and let us earlier thyroid check is indicated by internal medicine service Await echocardiogram  #Hyperthyroidism Await final recommendations from internal medicine Continue beta-blocker       Sheria Lang T. Lalla Brothers, MD, [facility], [facility] ([place] Cardiac Electrophysiology

## 2023-04-01 NOTE — Telephone Encounter (Signed)
Call received from patient to inform Dr. Myna Hidalgo that he was admitted to [facility] on 03/31/23 for A Fib.  Dr. Myna Hidalgo notified.

## 2023-04-01 NOTE — Discharge Summary (Signed)
DISCHARGE SUMMARY    Patient ID: John [name],  MRN: [id number], DOB/AGE: 03-07-1960 63 y.o.  Admit date: 03/31/2023 Discharge date: 04/01/2023  Primary Care Physician: Annett Fabian, MD  Primary Cardiologist: Dr. Mayford Knife Electrophysiologist: Dr. Lalla Brothers  Primary Discharge Diagnosis:  Persistent Afib CHA2DS2Vasc is 2 (3 with some NOD by CT), on eliquis  2. 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 male was referred to electrophysiology in the outpatient setting for consideration of PPM implantation.  Past medical history includes above.  Pt with symptomatic persistent AFib, planned for admission for amiodarone load and DDCV as well as medicine consult for newly discovered hyperthyroidism  Dr. Lalla Brothers discussed at length treatment options, concerns with amiodarone and his lung cancer, thyroid, though ultimately felt to be the best treatment option  Hospital Course:  The patient was admitted upon being placed on telemetry he was found in SR.  Started on amiodarone gtt, medicine team consulted. Plnned for updated echo 01/29/24 > transitioned to PO amiodarone IM team recommended  IM saw him, recommended If TSI positive, will treat with methimazole, if negative patient will need thyroid uptake and scan to confirm diagnosis which can be done as an outpatient  I confirmed with them that the patient can be discharged and follow up with his PMD . Unfortunately the office is closed and I am unable to assist in arranging that follow up The patient was advised to call  Monday to arrange a visit with his PMD.  I will as well, send a staff message. He did have what looked an IV infiltration, was removed this morning with minimal erythema, no tenderness, heat, or ongoing pain, no swelling  TTE: preliminarily reviewed by Dr. Lalla Brothers, ok to discharge to follow up formal read in the office/AFib clinic  The patient feels well, denies any CP/SOB.  He was examined by Dr. Lalla Brothers and considered stable for discharge to home.   Plan amiodarone 400mg  daily for 2 weeks then 200mg  daily  AFib clinic follow up in place to follow up and make sure amiodarone titration happens as planned   Physical Exam: Vitals:   04/01/23 0039 04/01/23 0417 04/01/23 0822 04/01/23 1407  BP: 99/69 107/69 114/75 114/68  Pulse: 80 76 74 69  Resp: 16 18 20 20   Temp: 98.2 F (36.8 C) 98.4 F (36.9 C) 97.9 F (36.6 C) 97.7 F (36.5 C)  TempSrc: Oral Oral Oral Oral  SpO2: 96% 92% 95%     GEN- The patient is well appearing, alert and oriented x 3 today.   HEENT: normocephalic, atraumatic; sclera clear, conjunctiva pink; hearing intact; oropharynx clear; neck supple, no JVP Lungs- CTA b/l, normal work of breathing.  No wheezes, rales, rhonchi Heart- RRR, no murmurs, rubs or gallops, PMI not laterally displaced GI- soft, non-tender, non-distended Extremities- no clubbing, cyanosis, or edema MS- no significant deformity or atrophy Skin- warm and dry, no rash or lesion Psych- euthymic mood, full affect Neuro- no gross deficits   Labs:   Lab Results  Component Value Date   WBC  2.8 (L) 04/01/2023   HGB 12.0 (L) 04/01/2023   HCT 36.3 (L) 04/01/2023   MCV 87.1 04/01/2023   PLT 174 04/01/2023    Recent Labs  Lab 03/28/23 1313 04/01/23 0434  NA 135 135  K 4.0 3.5  CL 103 103  CO2 22 24  BUN 16 8  CREATININE 1.04 1.30*  CALCIUM 8.5* 8.1*  PROT 7.6  --   BILITOT 1.2  --   ALKPHOS 61  --   ALT 13  --   AST 18  --   GLUCOSE 105* 99    Discharge Medications:  Allergies  as of 04/01/2023       Reactions   Ventolin [albuterol] Other (See Comments)   Patient has had episode of atrial fib following administration of albuterol (tolerates Xopenex well)   Lipitor [atorvastatin] Other (See Comments)   Arthralgias         Medication List     TAKE these medications    acetaminophen 500 MG tablet Commonly known as: TYLENOL Take 500 mg by mouth daily as needed for moderate pain (pain score 4-6), fever or headache.   amiodarone 400 MG tablet Commonly known as: PACERONE Take 1 tablet (400 mg total) by mouth daily. START by taking 2 tablets (400mg ) daily for 2 weeks THEN reduce to 1 tablet (200mg ) daily Start taking on: April 02, 2023 What changed:  medication strength how much to take additional instructions   Eliquis 5 MG Tabs tablet Generic drug: apixaban TAKE 1 TABLET(5 MG) BY MOUTH TWICE DAILY   metoprolol succinate 25 MG 24 hr tablet Commonly known as: TOPROL-XL TAKE 1 TABLET(25 MG) BY MOUTH DAILY   PARoxetine 10 MG tablet Commonly known as: PAXIL Take 1 tablet (10 mg total) by mouth at bedtime.   predniSONE 5 MG tablet Commonly known as: DELTASONE Take 1 tablet (5 mg total) by mouth daily with breakfast.   Repatha SureClick 140 MG/ML Soaj Generic drug: Evolocumab ADMINISTER 1 ML UNDER THE SKIN EVERY 14 DAYS   Trelegy Ellipta 100-62.5-25 MCG/ACT Aepb Generic drug: Fluticasone-Umeclidin-Vilant INHALE 1 PUFF INTO THE LUNGS DAILY        Disposition: home Discharge Instructions     Diet - low sodium heart healthy   Complete by: As directed    Increase activity slowly   Complete by: As directed        Follow-up Information     Annett Fabian, MD. Schedule an appointment as soon as possible for a visit.   Specialty: Internal Medicine Why: to follow up on thyroid test result and management recomendations Contact information: [address] [place] [address] [phone]                 Duration of Discharge  Encounter: 25 minutes, APP time  Norma Fredrickson, PA-C 04/01/2023 4:47 PM

## 2023-04-01 NOTE — Progress Notes (Signed)
PROGRESS NOTE    John [name]  EAV:[id number] DOB: October 21, 1960 DOA: 03/31/2023 PCP: Annett Fabian, MD   Brief Narrative:  HPI/course: John [name] is a 63 y.o. 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   Patient has had ongoing issues with his atrial fibrillation.  Recently seen for RVR in the ED 3 days ago and was also cardioversion at that time but declined.  He improved with a diltiazem bolus and was discharged home on his home diltiazem, metoprolol and Eliquis.   He subsequently started taking amiodarone on his own that he had leftover from prior to it being discontinued.  He followed up with his EP provider yesterday and after discussion opted for inpatient IV amiodarone administration with plan for cardioversion inpatient.   He was direct admitted today after his most recent radiation treatment for his malignancy.   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 > Recent CT scan on 2/10 noted no thyroid mass.  No obvious clinical signs of brain disease.  TSI pending.  On beta-blocker.  Rates controlled.  If TSI positive, will treat with methimazole, if negative patient will need thyroid uptake and scan to confirm diagnosis which can be done as an outpatient.  Patient is asymptomatic otherwise.  Atrial fibrillation Management per primary team.   Hyperlipidemia - On Repatha outpatient   CAD > By CT coronary study with minimal (0-24%) stenosis. - Noted - On anticoagulation and metoprolol   COPD - Formulary replacement for home Trelegy   Chronic respiratory failure with hypoxia > Due to drug-induced pneumonitis secondary to [facility].  I am seeing this patient for the first time and he is on room air.  I am not sure where this diagnosis of chronic respiratory failure with hypoxia came from.   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  > Previously received Keytruda which was discontinued due to above pneumonitis.  Also previously received docetaxel which was held due to disease progression. > Current regimen is Xgeva.  Receiving radiation therapy of adrenal gland.  Had been receiving Gemzar/Navelbine but this is on hold. - Noted   Rheumatoid arthritis - Continue home prednisone   History of seizures > No antiepileptics for about 30 years per chart.  DVT prophylaxis:    Code Status: Full Code  Family Communication:  None present at bedside.  Plan of care discussed with patient in length and he/she verbalized understanding and agreed with it.  Status is: Inpatient Remains inpatient appropriate because: At discretion of primary service/cardiology  Estimated body mass index is 34.58 kg/m as calculated from the following:   Height as of 03/28/23: 6' (1.829 m).   Weight as of 03/28/23: 115.7 kg.    Nutritional Assessment: There is no height or weight on file to calculate  BMI.. Seen by dietician.  I agree with the assessment and plan as outlined below: Nutrition Status:        . Skin Assessment: I have examined the patient's skin and I agree with the wound assessment as performed by the wound care RN as outlined below:    Consultants:  TRH  Procedures:  As above  Antimicrobials:  Anti-infectives (From admission, onward)    None         Subjective: Patient seen and examined, he has no complaints, he just is anxious about his thyroid.  Objective: Vitals:   03/31/23 2106 04/01/23 0039 04/01/23 0417 04/01/23 0822  BP: 105/77 99/69 107/69 114/75  Pulse: 78 80 76 74  Resp: 20 16 18 20   Temp: 98.1 F (36.7 C) 98.2 F (36.8 C) 98.4 F (36.9 C) 97.9 F (36.6 C)  TempSrc: Oral Oral Oral Oral  SpO2: 97% 96% 92% 95%    Intake/Output Summary (Last 24 hours) at 04/01/2023 1138 Last data filed at 04/01/2023 0954 Gross per 24 hour  Intake 766.31 ml  Output --  Net 766.31 ml   There were no vitals filed for this visit.  Examination:  General exam: Appears calm and comfortable  Respiratory system: Clear to auscultation. Respiratory effort normal. Cardiovascular system: S1 & S2 heard, RRR. No JVD, murmurs, rubs, gallops or clicks. No pedal edema. Gastrointestinal system: Abdomen is nondistended, soft and nontender. No organomegaly or masses felt. Normal bowel sounds heard. Central nervous system: Alert and oriented. No focal neurological deficits. Extremities: Symmetric 5 x 5 power. Skin: No rashes, lesions or ulcers Psychiatry: Judgement and insight appear normal. Mood & affect appropriate.    Data Reviewed: I have personally reviewed following labs and imaging studies  CBC: Recent Labs  Lab 03/28/23 1313 04/01/23 0434  WBC 4.0 2.8*  NEUTROABS 2.8  --   HGB 13.3 12.0*  HCT 41.8 36.3*  MCV 88.7 87.1  PLT 198 174   Basic Metabolic Panel: Recent Labs  Lab 03/28/23 1313 04/01/23 0434  NA 135 135  K 4.0 3.5  CL 103 103   CO2 22 24  GLUCOSE 105* 99  BUN 16 8  CREATININE 1.04 1.30*  CALCIUM 8.5* 8.1*  MG 1.8  --    GFR: Estimated Creatinine Clearance: 78.3 mL/min (A) (by C-G formula based on SCr of 1.3 mg/dL (H)). Liver Function Tests: Recent Labs  Lab 03/28/23 1313  AST 18  ALT 13  ALKPHOS 61  BILITOT 1.2  PROT 7.6  ALBUMIN 3.7   No results for input(s): "LIPASE", "AMYLASE" in the last 168 hours. No results for input(s): "AMMONIA" in the last 168 hours. Coagulation Profile: No results for input(s): "INR", "PROTIME" in the last 168 hours. Cardiac Enzymes: No results for input(s): "CKTOTAL", "CKMB", "CKMBINDEX", "TROPONINI" in the last 168 hours. BNP (last 3 results) No results for input(s): "PROBNP" in the last 8760 hours. HbA1C: No results for input(s): "HGBA1C" in the last 72 hours. CBG: No results for input(s): "GLUCAP" in the last 168 hours. Lipid Profile: No results for input(s): "CHOL", "HDL", "LDLCALC", "TRIG", "CHOLHDL", "LDLDIRECT" in the last 72 hours. Thyroid Function Tests: No results for input(s): "TSH", "T4TOTAL", "FREET4", "T3FREE", "THYROIDAB" in the last 72 hours. Anemia Panel: No  results for input(s): "VITAMINB12", "FOLATE", "FERRITIN", "TIBC", "IRON", "RETICCTPCT" in the last 72 hours. Sepsis Labs: No results for input(s): "PROCALCITON", "LATICACIDVEN" in the last 168 hours.  Recent Results (from the past 240 hours)  Resp panel by RT-PCR (RSV, Flu A&B, Covid) Anterior Nasal Swab     Status: None   Collection Time: 03/28/23  1:49 PM   Specimen: Anterior Nasal Swab  Result Value Ref Range Status   SARS Coronavirus 2 by RT PCR NEGATIVE NEGATIVE Final    Comment: (NOTE) SARS-CoV-2 target nucleic acids are NOT DETECTED.  The SARS-CoV-2 RNA is generally detectable in upper respiratory specimens during the acute phase of infection. The lowest concentration of SARS-CoV-2 viral copies this assay can detect is 138 copies/mL. A negative result does not preclude  SARS-Cov-2 infection and should not be used as the sole basis for treatment or other patient management decisions. A negative result may occur with  improper specimen collection/handling, submission of specimen other than nasopharyngeal swab, presence of viral mutation(s) within the areas targeted by this assay, and inadequate number of viral copies(<138 copies/mL). A negative result must be combined with clinical observations, patient history, and epidemiological information. The expected result is Negative.  Fact Sheet for Patients:  BloggerCourse.com  Fact Sheet for Healthcare Providers:  SeriousBroker.it  This test is no t yet approved or cleared by the [place] FDA and  has been authorized for detection and/or diagnosis of SARS-CoV-2 by FDA under an Emergency Use Authorization (EUA). This EUA will remain  in effect (meaning this test can be used) for the duration of the COVID-19 declaration under Section 564(b)(1) of the Act, 21 U.S.C.section 360bbb-3(b)(1), unless the authorization is terminated  or revoked sooner.       Influenza A by PCR NEGATIVE NEGATIVE Final   Influenza B by PCR NEGATIVE NEGATIVE Final    Comment: (NOTE) The Xpert Xpress SARS-CoV-2/FLU/RSV plus assay is intended as an aid in the diagnosis of influenza from Nasopharyngeal swab specimens and should not be used as a sole basis for treatment. Nasal washings and aspirates are unacceptable for Xpert Xpress SARS-CoV-2/FLU/RSV testing.  Fact Sheet for Patients: BloggerCourse.com  Fact Sheet for Healthcare Providers: SeriousBroker.it  This test is not yet approved or cleared by the [place] FDA and has been authorized for detection and/or diagnosis of SARS-CoV-2 by FDA under an Emergency Use Authorization (EUA). This EUA will remain in effect (meaning this test can be used) for the duration of  the COVID-19 declaration under Section 564(b)(1) of the Act, 21 U.S.C. section 360bbb-3(b)(1), unless the authorization is terminated or revoked.     Resp Syncytial Virus by PCR NEGATIVE NEGATIVE Final    Comment: (NOTE) Fact Sheet for Patients: BloggerCourse.com  Fact Sheet for Healthcare Providers: SeriousBroker.it  This test is not yet approved or cleared by the [place] FDA and has been authorized for detection and/or diagnosis of SARS-CoV-2 by FDA under an Emergency Use Authorization (EUA). This EUA will remain in effect (meaning this test can be used) for the duration of the COVID-19 declaration under Section 564(b)(1) of the Act, 21 U.S.C. section 360bbb-3(b)(1), unless the authorization is terminated or revoked.  Performed at [facility], [address] [place], [place] [address]      Radiology Studies: No results found.  Scheduled Meds:  amiodarone  400 mg Oral Daily   apixaban  5 mg Oral BID   fluticasone furoate-vilanterol  1 puff Inhalation Daily   And   umeclidinium bromide  1 puff Inhalation Daily   metoprolol succinate  25 mg Oral Daily   PARoxetine  10 mg Oral QHS   predniSONE  5 mg Oral Q breakfast   Continuous Infusions:   LOS: 1 day   Hughie Closs, MD [facility]  04/01/2023, 11:38 AM   *Please note that this is a verbal dictation therefore any spelling or grammatical errors are due to the "Dragon Medical One" system interpretation.  Please page via Amion and do not message via secure chat for urgent patient care matters. Secure chat can be used for non urgent patient care matters.  How to contact the [facility] Attending or Consulting provider 7A - 7P or covering provider during after hours 7P -7A, for this patient?  Check the care team in [facility] and look for a) attending/consulting TR[facility] provider listed and b) the [facility] team listed. Page or secure chat 7A-7P. Log into  [email or web address] and use [place]'s universal password to access. If you do not have the password, please contact the hospital operator. Locate the [facility] provider you are looking for under [facility] and page to a number that you can be directly reached. If you still have difficulty reaching the provider, please page the [facility] (Director on Call) for the Hospitalists listed on amion for assistance.

## 2023-04-01 NOTE — Discharge Instructions (Addendum)
Please call your primary doctor's office Monday to arrange follow up on the thyroid results and plan for management.You should be seen with in a week or so.

## 2023-04-01 NOTE — Progress Notes (Signed)
  Echocardiogram 2D Echocardiogram has been performed.  Delcie Roch 04/01/2023, 2:19 PM

## 2023-04-01 NOTE — Telephone Encounter (Signed)
Patient with recent admissions for afib, Dr. Mayford Knife recommends referral to afib clinic. Referral placed, MyChart message sent.

## 2023-04-01 NOTE — Radiation Completion Notes (Addendum)
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 He developed fatigue, but otherwise denied any other side effects from the treatment.  The patient will return in one month and will continue follow up with Dr. Myna Hidalgo as well.      Joyice Faster, PA-C

## 2023-04-03 LAB — T3: T3, Total: 135 ng/dL (ref 71–180)

## 2023-04-03 LAB — THYROID STIMULATING IMMUNOGLOBULIN: Thyroid Stimulating Immunoglob: <0.1 [IU]/L (ref 0.00–0.55)

## 2023-04-04 ENCOUNTER — Telehealth: Payer: Self-pay

## 2023-04-04 NOTE — Transitions of Care (Post Inpatient/ED Visit) (Signed)
   04/04/2023  Name: John [name] MRN: [id number] DOB: 25-Nov-1960  Today's TOC FU Call Status: Today's TOC FU Call Status:: Successful TOC FU Call Completed TOC FU Call Complete Date: 04/04/23 Patient's Name and Date of Birth confirmed.  Transition Care Management Follow-up Telephone Call Date of Discharge: 04/01/23 Discharge Facility: [facility]) Type of Discharge: Inpatient Admission Primary Inpatient Discharge Diagnosis:: afib How have you been since you were released from the hospital?: Better Any questions or concerns?: No  Items Reviewed: Did you receive and understand the discharge instructions provided?: Yes Medications obtained,verified, and reconciled?: Yes (Medications Reviewed) Any new allergies since your discharge?: No Dietary orders reviewed?: Yes Do you have support at home?: Yes People in Home: spouse  Medications Reviewed Today: Medications Reviewed Today     Reviewed by Karena Addison, LPN (Licensed Practical Nurse) on 04/04/23 at 1042  Med List Status: <None>   Medication Order Taking? Sig Documenting Provider Last Dose Status Informant  acetaminophen (TYLENOL) 500 MG tablet [id number] No Take 500 mg by mouth daily as needed for moderate pain (pain score 4-6), fever or headache. [provider] Past Week Active Self, Pharmacy Records  amiodarone (PACERONE) 200 MG tablet [id number]  START by taking 2 tablets (400mg ) daily for 2 weeks THEN reduce to 1 tablet (200mg ) daily Wylie Hail  Active   ELIQUIS 5 MG TABS tablet [id number] No TAKE 1 TABLET(5 MG) BY MOUTH TWICE DAILY Quintella Reichert, MD 03/31/2023  9:00 AM Active Pharmacy Records, Self  Evolocumab [facility] SURECLICK) 140 MG/ML SOAJ [id number] No ADMINISTER 1 ML UNDER THE SKIN EVERY 14 DAYS Quintella Reichert, MD 03/30/2023 Bedtime Active Pharmacy Records, Self  metoprolol succinate (TOPROL-XL) 25 MG 24 hr tablet [id number] No TAKE 1 TABLET(25 MG) BY MOUTH DAILY Quintella Reichert, MD 03/31/2023  Morning Active Pharmacy Records, Self  PARoxetine (PAXIL) 10 MG tablet [id number] No Take 1 tablet (10 mg total) by mouth at bedtime. Annett Fabian, MD 03/30/2023 Bedtime Active Pharmacy Records, Self  predniSONE (DELTASONE) 5 MG tablet [id number] No Take 1 tablet (5 mg total) by mouth daily with breakfast. Josph Macho, MD 03/31/2023 Morning Active Pharmacy Records, Self           Med Note (COFFELL, Marzella Schlein   Thu Mar 31, 2023  4:13 PM) Pt is taking continuously.  Patient not taking:  Discontinued 07/20/21 1357 TRELEGY ELLIPTA 100-62.5-25 MCG/ACT AEPB [id number] No INHALE 1 PUFF INTO THE LUNGS DAILY Leslye Peer, MD 03/31/2023 Morning Active Pharmacy Records, Self            Home Care and Equipment/Supplies: Were Home Health Services Ordered?: NA Any new equipment or medical supplies ordered?: NA  Functional Questionnaire: Do you need assistance with bathing/showering or dressing?: No Do you need assistance with meal preparation?: No Do you need assistance with eating?: No Do you have difficulty maintaining continence: No Do you need assistance with getting out of bed/getting out of a chair/moving?: No Do you have difficulty managing or taking your medications?: No  Follow up appointments reviewed: PCP Follow-up appointment confirmed?: No (no avail appt, sent message to staff to schedule) MD Provider Line Number:[phone] Given: No Specialist Hospital Follow-up appointment confirmed?: Yes Date of Specialist follow-up appointment?: 04/15/23 Follow-Up Specialty Provider:: afib clinic Do you need transportation to your follow-up appointment?: No Do you understand care options if your condition(s) worsen?: Yes-patient verbalized understanding    SIGNATURE Karena Addison, LPN [facility] Nurse Health Advisor Direct Dial [phone]

## 2023-04-06 ENCOUNTER — Ambulatory Visit (HOSPITAL_COMMUNITY): Payer: Managed Care, Other (non HMO) | Admitting: Physician Assistant

## 2023-04-06 ENCOUNTER — Encounter: Payer: Self-pay | Admitting: Cardiology

## 2023-04-12 ENCOUNTER — Encounter: Payer: Managed Care, Other (non HMO) | Admitting: Student

## 2023-04-12 ENCOUNTER — Other Ambulatory Visit: Payer: Self-pay

## 2023-04-12 ENCOUNTER — Encounter: Payer: Self-pay | Admitting: Student

## 2023-04-12 ENCOUNTER — Ambulatory Visit: Payer: Managed Care, Other (non HMO) | Admitting: Student

## 2023-04-12 VITALS — BP 109/64 | HR 78 | Temp 97.7°F | Ht 69.0 in | Wt 255.7 lb

## 2023-04-12 DIAGNOSIS — I4819 Other persistent atrial fibrillation: Secondary | ICD-10-CM

## 2023-04-12 DIAGNOSIS — E0590 Thyrotoxicosis, unspecified without thyrotoxic crisis or storm: Secondary | ICD-10-CM

## 2023-04-12 MED ORDER — METHIMAZOLE 5 MG PO TABS: 5.0000 mg | ORAL_TABLET | Freq: Every day | ORAL | 3 refills | Status: DC

## 2023-04-12 NOTE — Assessment & Plan Note (Addendum)
 History of poorly controlled atrial fibrillation, recently hospitalized for the same.  Follows regularly in the atrial fibrillation clinic and has an appointment in the next few days.  He is currently taking amiodarone 400 mg daily and states that he has had no concerns.  He has also been taking Eliquis 5 mg twice daily and has not missed any doses.  His rate is controlled in clinic today.  He denies any palpitations, lightheadedness, dizziness, chest pain, syncope, focal weakness.  I will recheck a CMP to evaluate his liver enzymes in the setting of his amiodarone use.  He may continue his current medications as described above and follow-up regularly with his cardiologist for further management.

## 2023-04-12 NOTE — Assessment & Plan Note (Deleted)
 History of poorly controlled atrial fibrillation, recently hospitalized for chemical cardioversion with amiodarone.  States that amiodarone is going well.  Since his started on amiodarone, we will monitor for toxicity.  Will recheck a CMP and thyroid labs today.

## 2023-04-12 NOTE — Patient Instructions (Signed)
 Thank you, [name] for allowing [place] to provide your care today.  As we discussed, your thyroid levels were a little high, so I started on a medication called methimazole at a low dose (5 mg) daily.  Also ordered a radioactive iodine uptake scan for further characterization of your hyperthyroidism.  We will check a complete metabolic panel today.  Ongoing signs of the results.  Please continue taking your Eliquis and amiodarone as instructed by cardiologist for atrial fibrillation.  I have ordered the following labs for you:  Lab Orders         CMP14 + Anion Gap      I have ordered the following medication/changed the following medications:   Start the following medications: Meds ordered this encounter  Medications   methimazole (TAPAZOLE) 5 MG tablet    Sig: Take 1 tablet (5 mg total) by mouth daily.    Dispense:  90 tablet    Refill:  3     Follow up: 6 weeks  We look forward to seeing you next time. Please call our clinic at [phone] if you have any questions or concerns. The best time to call is Monday-Friday from 9am-4pm, but there is someone available 24/7. If after hours or the weekend, call the main hospital number and ask for the Internal Medicine Resident On-Call. If you need medication refills, please notify your pharmacy one week in advance and they will send [place] a request.   Thank you for trusting me with your care. Wishing you the best!   Annett Fabian, MD [facility]

## 2023-04-12 NOTE — Assessment & Plan Note (Addendum)
 Recent thyroid labs show a mild hyperthyroidism with a suppressed TSH less than 0.01, elevated T4 at 2.09, total T3 135.  Thyroid-stimulating immunoglobulins are negative.  Thyroid exam is unremarkable.  He has had recent imaging including a PET scan and CTA, which did not demonstrate any focal thyroid lesions.    Given his atrial fibrillation, I feel he could benefit from medical management of his hyperthyroidism, so I have started him on methimazole 5 mg daily.  I have also placed an order for radioactive iodine uptake scan of his thyroid for further characterization of his hyperthyroidism.    We will follow-up in 6 weeks to recheck his thyroid labs, CBC, and CMP, and follow-up on the results of his thyroid scan.

## 2023-04-12 NOTE — Progress Notes (Signed)
    CC: Hospital Follow Up after admission from 2/13 to 2/14 for Afib cardioversion  HPI:  John [name] is a 63 y.o. 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 Please see assessment and plan below for further details.  Review of Systems:   Pertinent items noted in HPI and/or A&P.  Physical Exam:  Vitals:   04/12/23 0846  BP: 109/64  Pulse: 78  Temp: 97.7 F (36.5 C)  TempSrc: Oral  SpO2: 93%  Weight: 255 lb 11.2 oz (116 kg)  Height: 5\' 9"  (1.753 m)   Middle-age man, in clinic wheelchair, no acute distress Heart rate regular, no murmurs appreciated, no lower extremity edema Lungs clear to auscultation bilaterally with normal respiratory rate and effort No apparent skin changes No exophthalmos, eye disease evident Thyroid grossly normal to exam, no tenderness or nodules palpated  Assessment & Plan:   Persistent atrial fibrillation (HCC) History of poorly controlled atrial fibrillation, recently hospitalized for the same.  Follows regularly in the atrial fibrillation clinic and has an appointment in the next few days.  He is currently taking amiodarone 400 mg daily and states that he has had no concerns.  He has also been taking Eliquis 5 mg twice daily and has not missed any doses.  His rate is controlled in clinic today.  He denies any palpitations, lightheadedness, dizziness, chest pain, syncope, focal weakness.  I will recheck a CMP to evaluate his liver enzymes in the setting of his amiodarone use.  He may continue his current medications as described above and follow-up regularly with his cardiologist for further management.  Hyperthyroidism Recent thyroid labs show a mild hyperthyroidism with a suppressed TSH less than 0.01, elevated T4 at 2.09, total T3 135.  Thyroid-stimulating immunoglobulins are negative.  Thyroid exam is unremarkable.  He has had recent imaging including a PET  scan and CTA, which did not demonstrate any focal thyroid lesions.    Given his atrial fibrillation, I feel he could benefit from medical management of his hyperthyroidism, so I have started him on methimazole 5 mg daily.  I have also placed an order for radioactive iodine uptake scan of his thyroid for further characterization of his hyperthyroidism.    We will follow-up in 6 weeks to recheck his thyroid labs, CBC, and CMP, and follow-up on the results of his thyroid scan.   Patient discussed with Dr. Synthia Innocent, MD [facility] Internal Medicine Resident PGY-1 Clinic Phone: [phone] Pager: [phone]

## 2023-04-13 LAB — CMP14 + ANION GAP
ALT: 16 [IU]/L (ref 0–44)
AST: 21 [IU]/L (ref 0–40)
Albumin: 4 g/dL (ref 3.9–4.9)
Alkaline Phosphatase: 69 [IU]/L (ref 44–121)
Anion Gap: 13 mmol/L (ref 10.0–18.0)
BUN/Creatinine Ratio: 13 (ref 10–24)
BUN: 12 mg/dL (ref 8–27)
Bilirubin Total: 0.6 mg/dL (ref 0.0–1.2)
CO2: 22 mmol/L (ref 20–29)
Calcium: 8.4 mg/dL — ABNORMAL LOW (ref 8.6–10.2)
Chloride: 104 mmol/L (ref 96–106)
Creatinine, Ser: 0.96 mg/dL (ref 0.76–1.27)
Globulin, Total: 2.5 g/dL (ref 1.5–4.5)
Glucose: 86 mg/dL (ref 70–99)
Potassium: 4.2 mmol/L (ref 3.5–5.2)
Sodium: 139 mmol/L (ref 134–144)
Total Protein: 6.5 g/dL (ref 6.0–8.5)
eGFR: 90 mL/min/{1.73_m2} (ref >59)

## 2023-04-13 NOTE — Progress Notes (Signed)
 Internal Medicine Clinic Attending  Case discussed with the resident at the time of the visit.  We reviewed the resident's history and exam and pertinent patient test results.  I agree with the assessment, diagnosis, and plan of care documented in the resident's note.

## 2023-04-15 ENCOUNTER — Ambulatory Visit (HOSPITAL_COMMUNITY)
Admit: 2023-04-15 | Discharge: 2023-04-15 | Disposition: A | Discharge status: Home / Self Care | Payer: Managed Care, Other (non HMO) | Attending: Internal Medicine | Admitting: Internal Medicine

## 2023-04-15 VITALS — BP 110/62 | HR 61 | Ht 69.0 in | Wt 256.0 lb

## 2023-04-15 DIAGNOSIS — Z79899 Other long term (current) drug therapy: Secondary | ICD-10-CM | POA: Insufficient documentation

## 2023-04-15 DIAGNOSIS — I4819 Other persistent atrial fibrillation: Secondary | ICD-10-CM | POA: Diagnosis present

## 2023-04-15 DIAGNOSIS — Z5181 Encounter for therapeutic drug level monitoring: Secondary | ICD-10-CM

## 2023-04-15 DIAGNOSIS — D6869 Other thrombophilia: Secondary | ICD-10-CM | POA: Diagnosis not present

## 2023-04-15 DIAGNOSIS — Z7901 Long term (current) use of anticoagulants: Secondary | ICD-10-CM | POA: Diagnosis not present

## 2023-04-15 LAB — T4, FREE: Free T4: 1.87 ng/dL — ABNORMAL HIGH (ref 0.61–1.12)

## 2023-04-15 LAB — TSH: TSH: 0.022 u[IU]/mL — ABNORMAL LOW (ref 0.350–4.500)

## 2023-04-15 NOTE — Progress Notes (Signed)
 Primary Care Physician: Annett Fabian, MD Primary Cardiologist: Armanda Magic, MD Electrophysiologist: Dr. Lalla Brothers    Referring Physician: Dr. Kelvin Cellar Mol is a 63 y.o. 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 Seen in ED 03/28/23 for Afib with RVR. Seen by Dr. Lalla Brothers on 03/30/23 and patient noted at OV he self-restarted amiodarone 200 mg once daily; last radiation treatment 2/13. Patient was admitted to the hospital 2/13-14 for IV amiodarone administration. Discharged on amiodarone 400 mg daily for 2 weeks then 200 mg daily. Seen by IM on 2/25 and placed on methimazole 5 mg daily for mild hyperthyroidism. Patient is on Eliquis 5 mg BID for a CHADS2VASC score of at least 2.  On evaluation today, he is currently in NSR. He is currently taking amiodarone 200 mg once daily. He feels much better in normal rhythm and prefers taking amiodarone to have improved quality of life in rhythm. No missed doses of Eliquis.   Today, he denies symptoms of palpitations, chest pain, shortness of breath, orthopnea, PND, lower extremity edema, dizziness, presyncope, syncope, snoring, daytime somnolence, bleeding, or neurologic sequela. The patient is tolerating medications without difficulties and is otherwise without complaint today.    he has a BMI of Body mass index is 37.8 kg/m.[name] Filed Weights   04/15/23 1000  Weight: 116.1 kg    Current Outpatient Medications  Medication Sig Dispense Refill   acetaminophen (TYLENOL) 500 MG tablet Take 500 mg by mouth daily as needed for moderate pain (pain score 4-6), fever or headache.     amiodarone (PACERONE) 200 MG tablet START by taking 2 tablets (400mg ) daily for 2 weeks THEN reduce to 1 tablet (200mg ) daily (Patient taking differently: Take 200 mg  by mouth daily.) 90 tablet 3   ELIQUIS 5 MG TABS tablet TAKE 1 TABLET(5 MG) BY MOUTH TWICE DAILY 60 tablet 5   Evolocumab (REPATHA SURECLICK) 140 MG/ML SOAJ ADMINISTER 1 ML UNDER THE SKIN EVERY 14 DAYS 6 mL 1   methimazole (TAPAZOLE) 5 MG tablet Take 1 tablet (5 mg total) by mouth daily. 90 tablet 3   metoprolol succinate (TOPROL-XL) 25 MG 24 hr tablet TAKE 1 TABLET(25 MG) BY MOUTH DAILY 90 tablet 1   PARoxetine (PAXIL) 10 MG tablet Take 1 tablet (10 mg total) by mouth at bedtime. 90 tablet 2   predniSONE (DELTASONE) 5 MG tablet Take 1 tablet (5 mg total) by mouth daily with breakfast. 90 tablet 4   TRELEGY ELLIPTA 100-62.5-25 MCG/ACT AEPB INHALE 1 PUFF INTO THE LUNGS DAILY 60 each 5   No current facility-administered medications for this encounter.    Atrial Fibrillation Management history:  Previous antiarrhythmic drugs: amiodarone Previous cardioversions: 10/13/20 Previous ablations: none Anticoagulation history: Eliquis   ROS- All systems are reviewed and negative except as per the HPI above.  Physical Exam: BP 110/62   Pulse 61   Ht 5\' 9"  (1.753 m)   Wt 116.1 kg   BMI 37.80 kg/m   GEN: Well nourished, well developed in no acute distress NECK: No JVD; No carotid bruits CARDIAC: Regular rate and rhythm, no murmurs, rubs, gallops RESPIRATORY:  Clear to auscultation without rales, wheezing or rhonchi  ABDOMEN: Soft, non-tender, non-distended EXTREMITIES:  No edema; No deformity   EKG today demonstrates  Vent. rate 61 BPM PR interval 174 ms  QRS duration 90 ms QT/QTcB 452/455 ms P-R-T axes 31 -77 6 Normal sinus rhythm Left axis deviation Low voltage QRS Inferior infarct , age undetermined Abnormal ECG When compared with ECG of 31-Mar-2023 16:38, PREVIOUS ECG IS PRESENT  Echo 04/01/23 demonstrated  1. Left ventricular ejection fraction, by estimation, is 60 to 65%. The  left ventricle has normal function. The left ventricle has no regional  wall motion abnormalities.  Left ventricular diastolic parameters are  consistent with Grade I diastolic  dysfunction (impaired relaxation).   2. Right ventricular systolic function is normal. The right ventricular  size is normal.   3. The mitral valve is normal in structure. No evidence of mitral valve  regurgitation. No evidence of mitral stenosis.   4. The aortic valve is tricuspid. Aortic valve regurgitation is not  visualized. No aortic stenosis is present.   5. The inferior vena cava is normal in size with greater than 50%  respiratory variability, suggesting right atrial pressure of 3 mmHg.   ASSESSMENT & PLAN CHA2DS2-VASc Score = 2  The patient's score is based upon: CHF History: 0 HTN History: 1 Diabetes History: 0 Stroke History: 0 Vascular Disease History: 1 Age Score: 0 Gender Score: 0       ASSESSMENT AND PLAN: Persistent Atrial Fibrillation (ICD10:  I48.19) The patient's CHA2DS2-VASc score is 2, indicating a 2.2% annual risk of stroke.    He is currently in NSR.  Secondary Hypercoagulable State (ICD10:  D68.69) The patient is at significant risk for stroke/thromboembolism based upon his CHA2DS2-VASc Score of 2.  Continue Apixaban (Eliquis).  Continue Eliquis 5 mg BID.   High risk medication monitoring (ICD10: R7229428) Patient requires ongoing monitoring for anti-arrhythmic medication which has the potential to cause life threatening arrhythmias or AV block. Qtc stable. Continue amiodarone 200 mg daily. TSH and free T4 drawn today.  Cmet ordered from IM on 2/25 overall stable.      Follow up as scheduled with Dr. Lalla Brothers.    Lake Bells, PA-C  Afib Clinic [facility] [address] [place], [place] [address] [phone]

## 2023-04-18 NOTE — Progress Notes (Unsigned)
 Cardiology Office Note:    Date:  04/20/2023   ID:  Cohasset Wenzlick, DOB [birth year], MRN [id number]  PCP:  Annett Fabian, MD  Cardiologist:  Armanda Magic, MD    Referring MD: Annett Fabian, MD   Chief Complaint  Patient presents with   Atrial Fibrillation   Coronary Artery Disease   Hyperlipidemia    History of Present Illness:    John [name] is a 63 y.o. male with a hx of squamous cell carcinoma of lung, rheumatoid arthritis, GERD.  In April he presented to the emergency department with complaints of generalized weakness.  He had been started on systemic chemotherapy recently PTA.  He was noted to be tachycardic in rapid atrial fibrillation.  He was hospitalized for further management of acute C diff diarrhea, sepsis and febrile neutropenia.  Blood cx were + for E coli and started on antibx. He was started on IV Cardizem gtt for his afib.  TSH was normal.  He was started on  started on IV Amio and ultimately converted to NSR.  He was transitioned to oral Amio and Lopressor was added. No anticoagulation was started given his CHADS2VASC score of 0 as well as concern for possible tumor pressing on PA on CT imaging of chest.    He has had history of paroxysmal atrial fibrillation originally diagnosed in March 2022.  At that time he was started on amiodarone and converted to normal sinus rhythm.  There was concerns about potential amiodarone lung toxicity on CT scan performed 07/18/2020.  Amiodarone was stopped.  He had recurrent atrial fibrillation during hospitalization in August 2022.  At that time he underwent successful cardioversion.  After discussion with pulmonary, amiodarone was resumed.   He is followed in A-fib clinic and was last seen by them in January 2023 at which time he was maintaining sinus rhythm on amiodarone 200 mg daily Toprol-XL 25 mg daily. But then later in January 2023 after possible pulmonary fibrosis once again seen on CT scan, Dr. Myna Hidalgo had discussion requested that  amiodarone once again be discontinued.  After this point, he had another admission on 2/23 for spontaneous pneumothorax with chest tube.   He was hospitalized 08/01/2021 with recurrent atrial fibrillation with RVR in the 170s.  He had been off his amiodarone for over 3 months and expressed wishes to get back on amiodarone.  He understood the risks involved with going back on amiodarone in the setting of his pulmonary fibrosis but said that given his stage IV lung cancer he does not want to deal with atrial fibrillation so he was started on IV Amio  load and converted to p.o. prior to discharge.    He was seen back in A-fib clinic on 08/13/2021 and was maintaining normal sinus rhythm on amiodarone 200 mg twice daily.  It was decreased to 200 mg daily.  He was seen in the emergency room 03/28/2023 for A-fib with RVR and was reloaded with IV Amio and discharged home on amiodarone 400 mg daily for 2 weeks then 200 mg daily.  He is also had some mild hyperthyroidism and is on methimazole.  Seen back in A-fib clinic 04/15/2023 and was maintaining sinus rhythm on amiodarone 200 mg daily.  QTC was stable on that EKG sy .  He is here today for followup and is doing well.  He has chronic DOE related to his underlying lung disease but this is very stable. He occasionally has some mild LE edema if his socks are tight. He  denies any chest pain or pressure, PND, orthopnea,  dizziness, palpitations or syncope. He is compliant with his meds and is tolerating meds with no SE.    Past Medical History:  Diagnosis Date   Arthritis    Rheumatoid   Bacteremia due to Pseudomonas    C. difficile colitis    Chronic anxiety 11/08/2014   Cough 09/22/2021   COVID 2020   mild case   Depression    Drug-induced neutropenia (HCC) 08/11/2020   Family history of adverse reaction to anesthesia    brother with seizures had episode under anesthesia.  Patient has seizures as well.   GERD (gastroesophageal reflux disease)     History of radiation therapy 04/24/2020-05/16/2020   IMRT to right lung     Dr Antony Blackbird   History of radiation therapy    08/10/21-08/19/21-Dr. Antony Blackbird   Neutropenia [facility]) 05/12/2020   Neutropenic fever (HCC) 05/12/2020   Normocytic anemia 04/18/2021   PAF (paroxysmal atrial fibrillation) (HCC)    CHADS2VSAC score 3   Rheumatoid aortitis    Secondary hypercoagulable state (HCC) 10/28/2020   Sepsis (HCC) 10/06/2020   Shortness of breath 12/18/2020   Squamous cell lung cancer [facility])     Past Surgical History:  Procedure Laterality Date   BRONCHIAL BRUSHINGS  03/27/2020   Procedure: BRONCHIAL BRUSHINGS;  Surgeon: Leslye Peer, MD;  Location: [facility] ENDOSCOPY;  Service: Cardiopulmonary;;   BUBBLE STUDY  10/13/2020   Procedure: BUBBLE STUDY;  Surgeon: Chrystie Nose, MD;  Location: [facility] ENDOSCOPY;  Service: Cardiovascular;;   CARDIOVERSION N/A 10/13/2020   Procedure: CARDIOVERSION;  Surgeon: Chrystie Nose, MD;  Location: [facility] ENDOSCOPY;  Service: Cardiovascular;  Laterality: N/A;   CATARACT EXTRACTION  2016   at [facility]   FINE NEEDLE ASPIRATION  03/27/2020   Procedure: FINE NEEDLE ASPIRATION;  Surgeon: Leslye Peer, MD;  Location: [facility] ENDOSCOPY;  Service: Cardiopulmonary;;   HIP SURGERY Left    IR KYPHO EA ADDL LEVEL THORACIC OR LUMBAR  10/12/2021   IR KYPHO LUMBAR INC FX REDUCE BONE BX UNI/BIL CANNULATION INC/IMAGING  10/12/2021   IR KYPHO THORACIC WITH BONE BIOPSY  09/07/2021   IR RADIOLOGIST EVAL & MGMT  09/03/2021   RADIOLOGY WITH ANESTHESIA N/A 10/12/2021   Procedure: L1 and L3 Kyphoplasty;  Surgeon: Baldemar Lenis, MD;  Location: [facility] OR;  Service: Radiology;  Laterality: N/A;   TEE WITHOUT CARDIOVERSION N/A 10/13/2020   Procedure: TRANSESOPHAGEAL ECHOCARDIOGRAM (TEE);  Surgeon: Chrystie Nose, MD;  Location: [facility] ENDOSCOPY;  Service: Cardiovascular;  Laterality: N/A;   VIDEO BRONCHOSCOPY WITH ENDOBRONCHIAL ULTRASOUND N/A 03/27/2020   Procedure: VIDEO BRONCHOSCOPY WITH  ENDOBRONCHIAL ULTRASOUND;  Surgeon: Leslye Peer, MD;  Location: [facility] ENDOSCOPY;  Service: Cardiopulmonary;  Laterality: N/A;    Current Medications: Current Meds  Medication Sig   acetaminophen (TYLENOL) 500 MG tablet Take 500 mg by mouth daily as needed for moderate pain (pain score 4-6), fever or headache.   amiodarone (PACERONE) 200 MG tablet START by taking 2 tablets (400mg ) daily for 2 weeks THEN reduce to 1 tablet (200mg ) daily (Patient taking differently: Take 200 mg by mouth daily.)   ELIQUIS 5 MG TABS tablet TAKE 1 TABLET(5 MG) BY MOUTH TWICE DAILY   Evolocumab (REPATHA SURECLICK) 140 MG/ML SOAJ ADMINISTER 1 ML UNDER THE SKIN EVERY 14 DAYS   methimazole (TAPAZOLE) 5 MG tablet Take 1 tablet (5 mg total) by mouth daily.   metoprolol succinate (TOPROL-XL) 25 MG 24 hr tablet TAKE 1 TABLET(25  MG) BY MOUTH DAILY   PARoxetine (PAXIL) 10 MG tablet Take 1 tablet (10 mg total) by mouth at bedtime.   predniSONE (DELTASONE) 5 MG tablet Take 1 tablet (5 mg total) by mouth daily with breakfast.   TRELEGY ELLIPTA 100-62.5-25 MCG/ACT AEPB INHALE 1 PUFF INTO THE LUNGS DAILY     Allergies:   Ventolin [albuterol] and Lipitor [atorvastatin]   Social History   Socioeconomic History   Marital status: Married    Spouse name: Not on file   Number of children: Not on file   Years of education: Not on file   Highest education level: Not on file  Occupational History   Not on file  Tobacco Use   Smoking status: Former    Current packs/day: 0.00    Average packs/day: 1 pack/day for 30.0 years (30.0 ttl pk-yrs)    Types: Cigarettes    Start date: 81    Quit date: 2018    Years since quitting: 7.1   Smokeless tobacco: Never   Tobacco comments:    Former smoker, quit 2018  Vaping Use   Vaping status: Never Used  Substance and Sexual Activity   Alcohol use: Not Currently   Drug use: Never   Sexual activity: Not Currently  Other Topics Concern   Not on file  Social History Narrative    Not on file   Social Drivers of Health   Financial Resource Strain: Not on file  Food Insecurity: No Food Insecurity (03/31/2023)   Hunger Vital Sign    Worried About Running Out of Food in the Last Year: Never true    Ran Out of Food in the Last Year: Never true  Transportation Needs: No Transportation Needs (03/31/2023)   PRAPARE - Administrator, Civil Service (Medical): No    Lack of Transportation (Non-Medical): No  Physical Activity: Not on file  Stress: Not on file  Social Connections: Moderately Integrated (03/16/2022)   Social Connection and Isolation Panel [NHANES]    Frequency of Communication with Friends and Family: More than three times a week    Frequency of Social Gatherings with Friends and Family: More than three times a week    Attends Religious Services: More than 4 times per year    Active Member of [employer] or Organizations: No    Attends Banker Meetings: Never    Marital Status: Married     Family History: The patient's family history includes Arrhythmia in his mother; Heart disease in his father.  ROS:   Please see the history of present illness.    ROS  All other systems reviewed and negative.   EKGs/Labs/Other Studies Reviewed:    The following studies were reviewed today: none  Recent Labs: 03/28/2023: Magnesium 1.8 04/01/2023: Hemoglobin 12.0; Platelets 174 04/12/2023: ALT 16; BUN 12; Creatinine, Ser 0.96; Potassium 4.2; Sodium 139 04/15/2023: TSH 0.022   Recent Lipid Panel    Component Value Date/Time   CHOL 103 07/27/2022 0730   TRIG 145 07/27/2022 0730   HDL 33 (L) 07/27/2022 0730   CHOLHDL 3.1 07/27/2022 0730   CHOLHDL 7.6 06/29/2021 0754   VLDL 24 06/29/2021 0754   LDLCALC 45 07/27/2022 0730   LDLDIRECT 102 (H) 10/06/2021 1807    CHA2DS2-VASc Score = 2 [CHF History: 0, HTN History: 1, Diabetes History: 0, Stroke History: 0, Vascular Disease History: 1, Age Score: 0, Gender Score: 0].  Therefore, the patient's  annual risk of stroke is 2.2 %.  Physical Exam:    VS:  BP 92/70   Pulse 73   Ht 5\' 9"  (1.753 m)   Wt 256 lb (116.1 kg)   SpO2 91%   BMI 37.80 kg/m     Wt Readings from Last 3 Encounters:  04/20/23 256 lb (116.1 kg)  04/15/23 256 lb (116.1 kg)  04/12/23 255 lb 11.2 oz (116 kg)    GEN: Well nourished, well developed in no acute distress HEENT: Normal NECK: No JVD; No carotid bruits LYMPHATICS: No lymphadenopathy CARDIAC:RRR, no murmurs, rubs, gallops RESPIRATORY:  Clear to auscultation without rales, wheezing or rhonchi  ABDOMEN: Soft, non-tender, non-distended MUSCULOSKELETAL:  No edema; No deformity  SKIN: Warm and dry NEUROLOGIC:  Alert and oriented x 3 PSYCHIATRIC:  Normal affect  ASSESSMENT:    1. Persistent atrial fibrillation (HCC)   2. Coronary artery calcification seen on CT scan   3. Hyperlipidemia LDL goal <70     PLAN:    In order of problems listed above:  PAF --Followed in A-fib clinic -CHA2DS2-VASc score is 3 indicating a 3.2% of annual risk of stroke -Recently had a hospitalization last month for A-fib with RVR reloaded on amiodarone -Remain in normal sinus rhythm on exam today and denies any palpitations -Continue prescription drug management with amiodarone 200 mg daily, Eliquis 5 mg twice daily and Toprol-XL 25 mg daily with as needed refills -I have personally reviewed and interpreted outside labs performed by patient's PCP which showed serum creatinine 0.96, potassium 4.2, ALT 16, TSH 0.022, free T41.87 and hemoglobin 12 in February 2025 -he sees his ophthalmologist yearly -PFTs back in the fall showed a reduction in DLCO to 41% which dropped from 76% 2 years prior>>will send a copy of my note to his pulmonologist Dr. Delton Coombes  Coronary artery calcifications -Noted on chest CT 11/16/2021 -He has not had any anginal symptoms since I saw him last>> given lack of symptoms and stage IV metastatic lung cancer would not pursue further ischemic  work-up at this time and will get a coronary Ca score -No aspirin due to DOAC -Continue statin therapy  Hyperlipidemia -LDL less than 70 -I have personally reviewed and interpreted outside labs performed by patient's PCP which showed LDL 45, HDL 33 on 07/27/2022  -Continue prescription drug management with [facility] with as needed refills  Hyperthyroidism -dx last month during hospitalization for afib -started on methimazole -followed by PCP -I will make sure that he has followup with his PCP  Followup with me in 1 year and Dr. Lalla Brothers in 2 months   Medication Adjustments/Labs and Tests Ordered: Current medicines are reviewed at length with the patient today.  Concerns regarding medicines are outlined above.  No orders of the defined types were placed in this encounter.  No orders of the defined types were placed in this encounter.   Signed, Armanda Magic, MD  04/20/2023 8:18 AM    [facility]

## 2023-04-20 ENCOUNTER — Ambulatory Visit: Payer: Managed Care, Other (non HMO) | Attending: Cardiology | Admitting: Cardiology

## 2023-04-20 ENCOUNTER — Encounter: Payer: Self-pay | Admitting: Cardiology

## 2023-04-20 VITALS — BP 92/70 | HR 73 | Ht 69.0 in | Wt 256.0 lb

## 2023-04-20 DIAGNOSIS — E0590 Thyrotoxicosis, unspecified without thyrotoxic crisis or storm: Secondary | ICD-10-CM

## 2023-04-20 DIAGNOSIS — E785 Hyperlipidemia, unspecified: Secondary | ICD-10-CM

## 2023-04-20 DIAGNOSIS — I2510 Atherosclerotic heart disease of native coronary artery without angina pectoris: Secondary | ICD-10-CM | POA: Diagnosis not present

## 2023-04-20 DIAGNOSIS — I4819 Other persistent atrial fibrillation: Secondary | ICD-10-CM | POA: Diagnosis not present

## 2023-04-20 NOTE — Patient Instructions (Addendum)
 Medication Instructions:  Your physician recommends that you continue on your current medications as directed. Please refer to the Current Medication list given to you today.  *If you need a refill on your cardiac medications before your next appointment, please call your pharmacy*   Lab Work: NONE  If you have labs (blood work) drawn today and your tests are completely normal, you will receive your results only by: MyChart Message (if you have MyChart) OR A paper copy in the mail If you have any lab test that is abnormal or we need to change your treatment, we will call you to review the results.   Testing/Procedures: NONE   Follow-Up:with Dr. Mayford Knife in 1 year At [facility], you and your health needs are our priority.  As part of our continuing mission to provide you with exceptional heart care, we have created designated Provider Care Teams.  These Care Teams include your primary Cardiologist (physician) and Advanced Practice Providers (APPs -  Physician Assistants and Nurse Practitioners) who all work together to provide you with the care you need, when you need it.    Your next appointment:   2 month(s)  Provider:   Steffanie Dunn, MD       1st Floor: - Lobby - Registration  - Pharmacy  - Lab - Cafe  2nd Floor: - PV Lab - Diagnostic Testing (echo, CT, nuclear med)  3rd Floor: - Vacant  4th Floor: - TCTS (cardiothoracic surgery) - AFib Clinic - Structural Heart Clinic - Vascular Surgery  - Vascular Ultrasound  5th Floor: - HeartCare Cardiology (general and EP) - Clinical Pharmacy for coumadin, hypertension, lipid, weight-loss medications, and med management appointments    Valet parking services will be available as well.

## 2023-04-21 ENCOUNTER — Encounter: Payer: Managed Care, Other (non HMO) | Admitting: Student

## 2023-04-26 ENCOUNTER — Ambulatory Visit: Payer: Self-pay | Admitting: Student

## 2023-04-26 ENCOUNTER — Encounter: Payer: Self-pay | Admitting: Radiation Oncology

## 2023-04-26 NOTE — Telephone Encounter (Signed)
 Copied from CRM [id number]. Topic: Clinical - Red Word Triage >> Apr 26, 2023 10:44 AM John [name] wrote: Red Word that prompted transfer to Nurse Triage: The patient states he has been taking methimazole (TAPAZOLE) 5 MG tablet and experiencing constant stomach pain. He has been taking Tylenol but the pain is not subsiding.  Chief Complaint: abdominal pain  Symptoms: 9/10 abd pain feels like someone has punched me in the stomachbut never lets up Frequency: 2 days Pertinent Negatives: Patient denies vomiting, diarrhea Disposition: [] ED /[] Urgent Care (no appt availability in office) / [] Appointment(In office/virtual)/ []  [facility]/ [] Home Care/ [] Refused Recommended Disposition /[]  Mobile Bus/ []  Follow-up with PCP Additional Notes: called office and advised Venita Sheffield about pt ED refusal.  Was advised to send note over and clinical team will advise pt.  Reason for Disposition  [1] SEVERE pain AND [2] age > 60 years  Answer Assessment - Initial Assessment Questions 1. LOCATION: "Where does it hurt?"      Around navel  2. RADIATION: "Does the pain shoot anywhere else?" (e.g., chest, back)     no 3. ONSET: "When did the pain begin?" (Minutes, hours or days ago)      2 days  4. SUDDEN: "Gradual or sudden onset?"     *No Answer* 5. PATTERN "Does the pain come and go, or is it constant?"    - If it comes and goes: "How long does it last?" "Do you have pain now?"     (Note: Comes and goes means the pain is intermittent. It goes away completely between bouts.)    - If constant: "Is it getting better, staying the same, or getting worse?"      (Note: Constant means the pain never goes away completely; most serious pain is constant and gets worse.)      Constant  6. SEVERITY: "How bad is the pain?"  (e.g., Scale 1-10; mild, moderate, or severe)    - MILD (1-3): Doesn't interfere with normal activities, abdomen soft and not tender to touch.     - MODERATE (4-7): Interferes with  normal activities or awakens from sleep, abdomen tender to touch.     - SEVERE (8-10): Excruciating pain, doubled over, unable to do any normal activities.       severe 8. CAUSE: "What do you think is causing the stomach pain?"     Tapazole  10. OTHER SYMPTOMS: "Do you have any other symptoms?" (e.g., back pain, diarrhea, fever, urination pain, vomiting)       Constant stomach pain  Protocols used: Abdominal Pain - Male-A-AH

## 2023-04-26 NOTE — Telephone Encounter (Addendum)
 No available appointments in the Clinics until Thursday. Patient had previously refused to go to the ER per [facility] nurse.

## 2023-04-27 NOTE — Progress Notes (Signed)
 Radiation Oncology         [phone] ________________________________  Name: John [name] MRN: [id number]  Date: 04/28/2023  DOB: [birth year]  Follow-Up Visit Note  CC: John Fabian, MD  John Fabian, MD  No diagnosis found.  Diagnosis: The primary encounter diagnosis is Stage 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 (HCC). 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     Cancer Staging  Stage 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 (HCC) Staging form: Lung, AJCC 8th Edition - Clinical: Stage IVB (cT4, cN2, cM1c) - Signed by Si Gaul, MD on 04/10/2020  Interval Since Last Radiation:  1 month  First Treatment Date: 2023-03-21 Last Treatment Date: 2023-03-31   Plan Name: Abd_SBRT Site: Adrenal Gland, Right Technique: SBRT/SRT-IMRT Mode: Photon Dose Per Fraction: 8 Gy Prescribed Dose (Delivered / Prescribed): 40 Gy / 40 Gy Prescribed Fxs (Delivered / Prescribed): 5 / 5  Narrative:  The patient returns today for routine follow-up. He was last seen in office on 03-02-23 for a reconsultation visit. Since then, he completed his radiation treatment. He developed fatigue due to treatment but otherwise, he denied any further side effects or concerns. During a follow up with Dr. Myna Hidalgo on 03-23-23. At that time, he denied any concerns and was doing quite well.    However, patient presented to the ED on 03-28-23 with complains of heart racing. He was previously on amiodarone but discontinued it per oncologist recommendation. Imaging preformed while inpatient include: --CT angio chest PE showing slightly progressive diffuse bilateral ground-glass density or mosaicism compared to prior which could be due to acute airways disease versus diffuse bilateral infectious or inflammatory process. A right adrenal high  density lesion with soft tissue nodule along the left side with the less dense soft tissue nodule measuring 2.4 cm compared with 1.9 cm previously.  --DG chest showing no evidence of acute cardiopulmonary process.                               He returned to the ED on 03-31-23 to 04-01-23 to undergo IV amiodarone loading followed by cardioversion. He had restarted amiodarone 200 mg daily prior to his ED visit. Echo, EKG, and telemetry were all unremarkable for any significant findings. Continues to follow up with cardiology   No other significant oncologic interval history since the patient was last seen.    Allergies:  is allergic to ventolin [albuterol] and lipitor [atorvastatin].  Meds: Current Outpatient Medications  Medication Sig Dispense Refill   acetaminophen (TYLENOL) 500 MG tablet Take 500 mg by mouth daily as needed for moderate pain (pain score 4-6), fever or headache.     amiodarone (PACERONE) 200 MG tablet START by taking 2 tablets (400mg ) daily for 2 weeks THEN reduce to 1 tablet (200mg ) daily (Patient taking differently: Take 200 mg by mouth daily.) 90 tablet 3   ELIQUIS 5 MG TABS tablet TAKE 1 TABLET(5 MG) BY MOUTH TWICE DAILY 60 tablet 5   Evolocumab (REPATHA SURECLICK) 140 MG/ML SOAJ ADMINISTER 1 ML UNDER THE SKIN EVERY 14 DAYS 6 mL 1   methimazole (TAPAZOLE) 5 MG tablet Take 1 tablet (5 mg total) by mouth daily. 90 tablet 3   metoprolol succinate (TOPROL-XL) 25 MG 24 hr tablet TAKE 1 TABLET(25 MG)  BY MOUTH DAILY 90 tablet 1   PARoxetine (PAXIL) 10 MG tablet Take 1 tablet (10 mg total) by mouth at bedtime. 90 tablet 2   predniSONE (DELTASONE) 5 MG tablet Take 1 tablet (5 mg total) by mouth daily with breakfast. 90 tablet 4   TRELEGY ELLIPTA 100-62.5-25 MCG/ACT AEPB INHALE 1 PUFF INTO THE LUNGS DAILY 60 each 5   No current facility-administered medications for this encounter.    Physical Findings: The patient is in no acute distress. Patient is alert and oriented.  vitals  were not taken for this visit. .  No significant changes. Lungs are clear to auscultation bilaterally. Heart has regular rate and rhythm. No palpable cervical, supraclavicular, or axillary adenopathy. Abdomen soft, non-tender, normal bowel sounds.   Lab Findings: Lab Results  Component Value Date   WBC 2.8 (L) 04/01/2023   HGB 12.0 (L) 04/01/2023   HCT 36.3 (L) 04/01/2023   MCV 87.1 04/01/2023   PLT 174 04/01/2023    Radiographic Findings: ECHOCARDIOGRAM COMPLETE Result Date: 04/01/2023    ECHOCARDIOGRAM REPORT   Patient Name:   John [name]: 04/01/2023 Medical Rec #:  [id number]     Height:       72.0 in Accession #:    [id number]    Weight:       255.0 lb Date of Birth:  Dec 06, 1960     BSA:          2.362 m Patient Age:    62 years      BP:           114/75 mmHg Patient Gender: M             HR:           63 bpm. [name] Location:  Inpatient Procedure: 2D Echo (Both Spectral and Color Flow Doppler were utilized during            procedure). Indications:    atrial fibrillation  History:        Patient has prior history of Echocardiogram examinations, most                 recent 08/02/2021. CAD, COPD and lung cancer, Arrythmias:Atrial                 Fibrillation; Risk Factors:Dyslipidemia.  Sonographer:    John [name] RDCS Referring Phys: [id number] John [name]  Sonographer Comments: Image acquisition challenging due to respiratory motion. IMPRESSIONS  1. Left ventricular ejection fraction, by estimation, is 60 to 65%. The left ventricle has normal function. The left ventricle has no regional wall motion abnormalities. Left ventricular diastolic parameters are consistent with Grade I diastolic dysfunction (impaired relaxation).  2. Right ventricular systolic function is normal. The right ventricular size is normal.  3. The mitral valve is normal in structure. No evidence of mitral valve regurgitation. No evidence of mitral stenosis.  4. The aortic valve is tricuspid. Aortic valve  regurgitation is not visualized. No aortic stenosis is present.  5. The inferior vena cava is normal in size with greater than 50% respiratory variability, suggesting right atrial pressure of 3 mmHg. FINDINGS  Left Ventricle: Left ventricular ejection fraction, by estimation, is 60 to 65%. The left ventricle has normal function. The left ventricle has no regional wall motion abnormalities. Definity contrast agent was given IV to delineate the left ventricular  endocardial borders. Strain imaging was not performed. The left ventricular internal cavity size was normal in size. There is no  left ventricular hypertrophy. Left ventricular diastolic parameters are consistent with Grade I diastolic dysfunction (impaired relaxation). Right Ventricle: The right ventricular size is normal. No increase in right ventricular wall thickness. Right ventricular systolic function is normal. Left Atrium: Left atrial size was normal in size. Right Atrium: Right atrial size was normal in size. Pericardium: There is no evidence of pericardial effusion. Mitral Valve: The mitral valve is normal in structure. No evidence of mitral valve regurgitation. No evidence of mitral valve stenosis. Tricuspid Valve: The tricuspid valve is normal in structure. Tricuspid valve regurgitation is not demonstrated. No evidence of tricuspid stenosis. Aortic Valve: The aortic valve is tricuspid. Aortic valve regurgitation is not visualized. No aortic stenosis is present. Pulmonic Valve: The pulmonic valve was normal in structure. Pulmonic valve regurgitation is not visualized. No evidence of pulmonic stenosis. Aorta: The aortic root is normal in size and structure. Venous: The inferior vena cava is normal in size with greater than 50% respiratory variability, suggesting right atrial pressure of 3 mmHg. IAS/Shunts: No atrial level shunt detected by color flow Doppler. Additional Comments: 3D imaging was not performed.  LEFT VENTRICLE PLAX 2D LVOT diam:      2.00 cm   Diastology LV SV:         63        LV e' medial:    6.64 cm/s LV SV Index:   27        LV E/e' medial:  9.7 LVOT Area:     3.14 cm  LV e' lateral:   10.80 cm/s                          LV E/e' lateral: 6.0  RIGHT VENTRICLE RV Basal diam:  3.10 cm RV S prime:     11.50 cm/s TAPSE (M-mode): 2.6 cm LEFT ATRIUM           Index        RIGHT ATRIUM           Index LA Vol (A4C): 55.8 ml 23.62 ml/m  RA Area:     16.40 cm                                    RA Volume:   43.50 ml  18.41 ml/m  AORTIC VALVE LVOT Vmax:   103.00 cm/s LVOT Vmean:  63.700 cm/s LVOT VTI:    0.201 m  AORTA Ao Root diam: 3.50 cm MITRAL VALVE MV Area (PHT): 2.87 cm    SHUNTS MV Decel Time: 264 msec    Systemic VTI:  0.20 m MV E velocity: 64.30 cm/s  Systemic Diam: 2.00 cm MV A velocity: 73.90 cm/s MV E/A ratio:  0.87 Chilton Si MD Electronically signed by Chilton Si MD Signature Date/Time: 04/01/2023/5:20:51 PM    Final    CT Angio Chest PE W and/or Wo Contrast Result Date: 03/28/2023 CLINICAL DATA:  History of lung cancer tachycardia [name]: CT ANGIOGRAPHY CHEST WITH CONTRAST TECHNIQUE: Multidetector CT imaging of the chest was performed using the standard protocol during bolus administration of intravenous contrast. Multiplanar CT image reconstructions and MIPs were obtained to evaluate the vascular anatomy. RADIATION DOSE REDUCTION: This [name] was performed according to the departmental dose-optimization program which includes automated exposure control, adjustment of the mA and/or kV according to patient size and/or use of iterative reconstruction technique. CONTRAST:  75mL OMNIPAQUE IOHEXOL  350 MG/ML SOLN COMPARISON:  Chest x-ray 03/28/2023, PET CT 02/11/2023 FINDINGS: Cardiovascular: Satisfactory opacification of the pulmonary arteries to the segmental level. No evidence of pulmonary embolism. Mild atherosclerosis. No aneurysm or dissection. Mild coronary vascular calcification. Mediastinum/Nodes: Patent trachea. No  thyroid mass. No suspicious lymph nodes. Esophagus within normal limits. Lungs/Pleura: Negative for pleural effusion or pneumothorax. Heterogeneous consolidation in the right lower lobe, surrounding cavitary process on series 12, image 65, measures about 1.8 cm cm and subjectively appears slightly smaller compared to recent PET CT. Slightly progressive diffuse bilateral ground-glass density or mosaicism compared to prior. Upper Abdomen: Right adrenal high density lesion noted with soft tissue nodule along the left side, corresponding to the area of activity on prior PET CT. This might be slightly larger, measuring 2.4 cm on series 4, image 29 compared with 1.9 cm. Gallstone at the neck of the gallbladder. Musculoskeletal: Chronic compression deformity the lower thoracic spine. No acute osseous abnormality Review of the MIP images confirms the above findings. IMPRESSION: 1. Negative for acute pulmonary embolus. 2. Heterogeneous consolidation in the right lower lobe with surrounding cavitary process, corresponding to history of treated lung cancer on recent PET CT. 3. Slightly progressive diffuse bilateral ground-glass density or mosaicism compared to prior which could be due to acute airways disease versus diffuse bilateral infectious or inflammatory process. 4. Right adrenal high density lesion with soft tissue nodule along the left side, corresponding to the area of activity/metastatic disease on prior PET CT. The less dense soft tissue nodule might be slightly larger, measuring 2.4 cm compared with 1.9 cm. 5. Gallstone at the neck of the gallbladder. Aortic Atherosclerosis (ICD10-I70.0). Electronically Signed   By: Jasmine Pang M.D.   On: 03/28/2023 18:17   DG Chest Portable 1 View Result Date: 03/28/2023 CLINICAL DATA:  Tachycardia. Atrial fibrillation with rapid ventricular response. Shortness of breath. History of lung cancer with radiation therapy. [name]: PORTABLE CHEST 1 VIEW COMPARISON:  Radiographs  05/31/2022 and 04/09/2022. Cardiac CT 09/10/2022. PET-CT 02/11/2023. FINDINGS: 1333 hours. Lordotic positioning. Persistent low lung volumes with grossly stable volume loss and asymmetric right lung opacities attributed to treated lung cancer. The left lung appears clear. The heart size and mediastinal contours are stable. No evidence of pneumothorax or significant pleural effusion. IMPRESSION: 1. No evidence of acute cardiopulmonary process. 2. Stable asymmetric right lung opacities attributed to treated lung cancer. Electronically Signed   By: Carey Bullocks M.D.   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   The patient is recovering from the effects of radiation.  ***  Plan:  ***   *** minutes of total time was spent for this patient encounter, including preparation, face-to-face counseling with the patient and coordination of care, physical [name], and documentation of the encounter. ____________________________________  Billie Lade, PhD, MD  This document serves as a record of services personally performed by Antony Blackbird, MD. It was created on his behalf by Herbie Saxon, a trained medical scribe. The creation of this record is based on the scribe's personal observations and the provider's statements to them. This document has been checked and approved by the attending provider.

## 2023-04-27 NOTE — Telephone Encounter (Addendum)
 Call to patient stopped the Methimazole last night.  Stomach feels a little better today.  Will continue to hold. Has another appointment on tomorrow .  Will call on Friday morning to let us know how he is feeling.

## 2023-04-28 ENCOUNTER — Ambulatory Visit
Admission: RE | Admit: 2023-04-28 | Discharge: 2023-04-28 | Disposition: A | Discharge status: Home / Self Care | Payer: Managed Care, Other (non HMO) | Source: Ambulatory Visit | Attending: Radiation Oncology | Admitting: Radiation Oncology

## 2023-04-28 ENCOUNTER — Ambulatory Visit
Admission: RE | Admit: 2023-04-28 | Discharge: 2023-04-28 | Disposition: A | Discharge status: Home / Self Care | Source: Ambulatory Visit | Attending: Radiation Oncology | Admitting: Radiation Oncology

## 2023-04-28 ENCOUNTER — Encounter: Payer: Self-pay | Admitting: Radiation Oncology

## 2023-04-28 VITALS — BP 139/81 | HR 67 | Temp 97.8°F | Resp 20 | Ht 71.0 in | Wt 242.8 lb

## 2023-04-28 DIAGNOSIS — Z7901 Long term (current) use of anticoagulants: Secondary | ICD-10-CM | POA: Insufficient documentation

## 2023-04-28 DIAGNOSIS — C3491 Malignant neoplasm of unspecified part of right bronchus or lung: Secondary | ICD-10-CM | POA: Diagnosis present

## 2023-04-28 DIAGNOSIS — Z7952 Long term (current) use of systemic steroids: Secondary | ICD-10-CM | POA: Insufficient documentation

## 2023-04-28 DIAGNOSIS — C3411 Malignant neoplasm of upper lobe, right bronchus or lung: Secondary | ICD-10-CM | POA: Insufficient documentation

## 2023-04-28 DIAGNOSIS — C3401 Malignant neoplasm of right main bronchus: Secondary | ICD-10-CM | POA: Diagnosis present

## 2023-04-28 DIAGNOSIS — Z923 Personal history of irradiation: Secondary | ICD-10-CM | POA: Insufficient documentation

## 2023-04-28 DIAGNOSIS — C7971 Secondary malignant neoplasm of right adrenal gland: Secondary | ICD-10-CM | POA: Insufficient documentation

## 2023-04-28 DIAGNOSIS — Z79899 Other long term (current) drug therapy: Secondary | ICD-10-CM | POA: Insufficient documentation

## 2023-04-28 DIAGNOSIS — R1010 Upper abdominal pain, unspecified: Secondary | ICD-10-CM | POA: Insufficient documentation

## 2023-04-28 LAB — CMP (CANCER CENTER ONLY)
ALT: 22 U/L (ref 0–44)
AST: 28 U/L (ref 15–41)
Albumin: 4 g/dL (ref 3.5–5.0)
Alkaline Phosphatase: 61 U/L (ref 38–126)
Anion gap: 4 — ABNORMAL LOW (ref 5–15)
BUN: 12 mg/dL (ref 8–23)
CO2: 27 mmol/L (ref 22–32)
Calcium: 8.6 mg/dL — ABNORMAL LOW (ref 8.9–10.3)
Chloride: 101 mmol/L (ref 98–111)
Creatinine: 1.05 mg/dL (ref 0.61–1.24)
GFR, Estimated: >60 mL/min (ref >60)
Glucose, Bld: 104 mg/dL — ABNORMAL HIGH (ref 70–99)
Potassium: 4.6 mmol/L (ref 3.5–5.1)
Sodium: 132 mmol/L — ABNORMAL LOW (ref 135–145)
Total Bilirubin: 1.1 mg/dL (ref 0.0–1.2)
Total Protein: 7.3 g/dL (ref 6.5–8.1)

## 2023-04-28 LAB — CBC WITH DIFFERENTIAL (CANCER CENTER ONLY)
Abs Immature Granulocytes: 0.01 10*3/uL (ref 0.00–0.07)
Basophils Absolute: 0 10*3/uL (ref 0.0–0.1)
Basophils Relative: 1 %
Eosinophils Absolute: 0.1 10*3/uL (ref 0.0–0.5)
Eosinophils Relative: 3 %
HCT: 42.7 % (ref 39.0–52.0)
Hemoglobin: 14 g/dL (ref 13.0–17.0)
Immature Granulocytes: 0 %
Lymphocytes Relative: 14 %
Lymphs Abs: 0.3 10*3/uL — ABNORMAL LOW (ref 0.7–4.0)
MCH: 28.3 pg (ref 26.0–34.0)
MCHC: 32.8 g/dL (ref 30.0–36.0)
MCV: 86.3 fL (ref 80.0–100.0)
Monocytes Absolute: 0.5 10*3/uL (ref 0.1–1.0)
Monocytes Relative: 20 %
Neutro Abs: 1.5 10*3/uL — ABNORMAL LOW (ref 1.7–7.7)
Neutrophils Relative %: 62 %
Platelet Count: 150 10*3/uL (ref 150–400)
RBC: 4.95 MIL/uL (ref 4.22–5.81)
RDW: 15.3 % (ref 11.5–15.5)
WBC Count: 2.4 10*3/uL — ABNORMAL LOW (ref 4.0–10.5)
nRBC: 0 % (ref 0.0–0.2)

## 2023-04-28 LAB — HEPATIC FUNCTION PANEL
ALT: 25 U/L (ref 0–44)
AST: 29 U/L (ref 15–41)
Albumin: 3.6 g/dL (ref 3.5–5.0)
Alkaline Phosphatase: 61 U/L (ref 38–126)
Bilirubin, Direct: 0.3 mg/dL — ABNORMAL HIGH (ref 0.0–0.2)
Indirect Bilirubin: 1.1 mg/dL — ABNORMAL HIGH (ref 0.3–0.9)
Total Bilirubin: 1.4 mg/dL — ABNORMAL HIGH (ref 0.0–1.2)
Total Protein: 6.9 g/dL (ref 6.5–8.1)

## 2023-04-28 MED ORDER — OXYCODONE HCL 5 MG PO TABS: 5.0000 mg | ORAL_TABLET | ORAL | 0 refills | Status: DC | PRN

## 2023-04-28 NOTE — Progress Notes (Signed)
 John [name] is here today for follow up post radiation to the abdomen.  They completed their radiation on: 2023-03-31   Does the patient complain of any of the following:  Pain:Yes, he reports right side pain 10/10 . Diarrhea/Constipation: Constipation Nausea/Vomiting: Nausea Blood in Urine or Stool: Denies Urinary Issues (dysuria/incomplete emptying/ incontinence/ increased frequency/urgency): Denies Post radiation skin changes: Denies    BP 139/81 (BP Location: Left Arm, Patient Position: Sitting, Cuff Size: Large)   Pulse 67   Temp 97.8 F (36.6 C)   Resp 20   Ht 5\' 11"  (1.803 m)   Wt 242 lb 12.8 oz (110.1 kg)   SpO2 95%   BMI 33.86 kg/m

## 2023-04-29 ENCOUNTER — Telehealth: Payer: Self-pay | Admitting: *Deleted

## 2023-04-29 ENCOUNTER — Encounter: Payer: Self-pay | Admitting: Hematology & Oncology

## 2023-04-29 ENCOUNTER — Emergency Department (HOSPITAL_COMMUNITY)

## 2023-04-29 ENCOUNTER — Inpatient Hospital Stay

## 2023-04-29 ENCOUNTER — Inpatient Hospital Stay: Attending: Internal Medicine | Admitting: Hematology & Oncology

## 2023-04-29 ENCOUNTER — Other Ambulatory Visit: Payer: Self-pay

## 2023-04-29 ENCOUNTER — Emergency Department (HOSPITAL_COMMUNITY)
Admission: EM | Admit: 2023-04-29 | Discharge: 2023-04-29 | Disposition: A | Discharge status: Home / Self Care | Attending: Emergency Medicine | Admitting: Emergency Medicine

## 2023-04-29 ENCOUNTER — Encounter (HOSPITAL_COMMUNITY): Payer: Self-pay

## 2023-04-29 VITALS — BP 89/55 | HR 72 | Resp 18

## 2023-04-29 VITALS — BP 92/49 | HR 87 | Temp 98.2°F | Resp 20 | Wt 243.0 lb

## 2023-04-29 DIAGNOSIS — C7971 Secondary malignant neoplasm of right adrenal gland: Secondary | ICD-10-CM | POA: Insufficient documentation

## 2023-04-29 DIAGNOSIS — D702 Other drug-induced agranulocytosis: Secondary | ICD-10-CM

## 2023-04-29 DIAGNOSIS — C3491 Malignant neoplasm of unspecified part of right bronchus or lung: Secondary | ICD-10-CM

## 2023-04-29 DIAGNOSIS — R52 Pain, unspecified: Secondary | ICD-10-CM | POA: Insufficient documentation

## 2023-04-29 DIAGNOSIS — C779 Secondary and unspecified malignant neoplasm of lymph node, unspecified: Secondary | ICD-10-CM | POA: Insufficient documentation

## 2023-04-29 DIAGNOSIS — R1084 Generalized abdominal pain: Secondary | ICD-10-CM

## 2023-04-29 DIAGNOSIS — J8410 Pulmonary fibrosis, unspecified: Secondary | ICD-10-CM | POA: Insufficient documentation

## 2023-04-29 DIAGNOSIS — C3401 Malignant neoplasm of right main bronchus: Secondary | ICD-10-CM

## 2023-04-29 DIAGNOSIS — C3411 Malignant neoplasm of upper lobe, right bronchus or lung: Secondary | ICD-10-CM | POA: Insufficient documentation

## 2023-04-29 DIAGNOSIS — C3400 Malignant neoplasm of unspecified main bronchus: Secondary | ICD-10-CM

## 2023-04-29 DIAGNOSIS — Z85118 Personal history of other malignant neoplasm of bronchus and lung: Secondary | ICD-10-CM | POA: Insufficient documentation

## 2023-04-29 DIAGNOSIS — Z923 Personal history of irradiation: Secondary | ICD-10-CM | POA: Diagnosis not present

## 2023-04-29 DIAGNOSIS — S22080A Wedge compression fracture of T11-T12 vertebra, initial encounter for closed fracture: Secondary | ICD-10-CM

## 2023-04-29 DIAGNOSIS — I480 Paroxysmal atrial fibrillation: Secondary | ICD-10-CM

## 2023-04-29 DIAGNOSIS — K8020 Calculus of gallbladder without cholecystitis without obstruction: Secondary | ICD-10-CM | POA: Insufficient documentation

## 2023-04-29 DIAGNOSIS — Z7901 Long term (current) use of anticoagulants: Secondary | ICD-10-CM | POA: Diagnosis not present

## 2023-04-29 DIAGNOSIS — E0590 Thyrotoxicosis, unspecified without thyrotoxic crisis or storm: Secondary | ICD-10-CM | POA: Diagnosis not present

## 2023-04-29 DIAGNOSIS — E871 Hypo-osmolality and hyponatremia: Secondary | ICD-10-CM | POA: Diagnosis not present

## 2023-04-29 DIAGNOSIS — E1169 Type 2 diabetes mellitus with other specified complication: Secondary | ICD-10-CM

## 2023-04-29 DIAGNOSIS — D72819 Decreased white blood cell count, unspecified: Secondary | ICD-10-CM | POA: Insufficient documentation

## 2023-04-29 DIAGNOSIS — E785 Hyperlipidemia, unspecified: Secondary | ICD-10-CM

## 2023-04-29 DIAGNOSIS — D649 Anemia, unspecified: Secondary | ICD-10-CM

## 2023-04-29 DIAGNOSIS — C3410 Malignant neoplasm of upper lobe, unspecified bronchus or lung: Secondary | ICD-10-CM

## 2023-04-29 LAB — BASIC METABOLIC PANEL
Anion gap: 10 (ref 5–15)
BUN: 9 mg/dL (ref 8–23)
CO2: 20 mmol/L — ABNORMAL LOW (ref 22–32)
Calcium: 8.5 mg/dL — ABNORMAL LOW (ref 8.9–10.3)
Chloride: 99 mmol/L (ref 98–111)
Creatinine, Ser: 1.09 mg/dL (ref 0.61–1.24)
GFR, Estimated: >60 mL/min (ref >60)
Glucose, Bld: 119 mg/dL — ABNORMAL HIGH (ref 70–99)
Potassium: 4.1 mmol/L (ref 3.5–5.1)
Sodium: 129 mmol/L — ABNORMAL LOW (ref 135–145)

## 2023-04-29 LAB — HEPATIC FUNCTION PANEL
ALT: 28 U/L (ref 0–44)
AST: 35 U/L (ref 15–41)
Albumin: 3.2 g/dL — ABNORMAL LOW (ref 3.5–5.0)
Alkaline Phosphatase: 53 U/L (ref 38–126)
Bilirubin, Direct: 0.3 mg/dL — ABNORMAL HIGH (ref 0.0–0.2)
Indirect Bilirubin: 0.8 mg/dL (ref 0.3–0.9)
Total Bilirubin: 1.1 mg/dL (ref 0.0–1.2)
Total Protein: 6.5 g/dL (ref 6.5–8.1)

## 2023-04-29 LAB — CBC
HCT: 41.8 % (ref 39.0–52.0)
Hemoglobin: 14.2 g/dL (ref 13.0–17.0)
MCH: 28.7 pg (ref 26.0–34.0)
MCHC: 34 g/dL (ref 30.0–36.0)
MCV: 84.6 fL (ref 80.0–100.0)
Platelets: 170 10*3/uL (ref 150–400)
RBC: 4.94 MIL/uL (ref 4.22–5.81)
RDW: 15.2 % (ref 11.5–15.5)
WBC: 2.9 10*3/uL — ABNORMAL LOW (ref 4.0–10.5)
nRBC: 0 % (ref 0.0–0.2)

## 2023-04-29 LAB — URINALYSIS, ROUTINE W REFLEX MICROSCOPIC
Bilirubin Urine: NEGATIVE
Glucose, UA: NEGATIVE mg/dL
Hgb urine dipstick: NEGATIVE
Ketones, ur: NEGATIVE mg/dL
Leukocytes,Ua: NEGATIVE
Nitrite: NEGATIVE
Protein, ur: NEGATIVE mg/dL
Specific Gravity, Urine: 1.024 (ref 1.005–1.030)
pH: 6 (ref 5.0–8.0)

## 2023-04-29 LAB — PROTIME-INR
INR: 1.2 (ref 0.8–1.2)
Prothrombin Time: 15.8 s — ABNORMAL HIGH (ref 11.4–15.2)

## 2023-04-29 MED ORDER — ONDANSETRON HCL 4 MG/2ML IJ SOLN
4.0000 mg | Freq: Once | INTRAMUSCULAR | Status: AC
  Administered 2023-04-29: 4 mg via INTRAVENOUS
  Filled 2023-04-29: qty 2

## 2023-04-29 MED ORDER — MORPHINE SULFATE (PF) 4 MG/ML IV SOLN
4.0000 mg | Freq: Once | INTRAVENOUS | Status: AC
  Administered 2023-04-29: 4 mg via INTRAVENOUS
  Filled 2023-04-29: qty 1

## 2023-04-29 MED ORDER — SODIUM CHLORIDE 0.9 % IV SOLN
INTRAVENOUS | Status: DC
Start: 2023-04-29 — End: 2023-04-29

## 2023-04-29 MED ORDER — KETOROLAC TROMETHAMINE 15 MG/ML IJ SOLN
30.0000 mg | Freq: Once | INTRAMUSCULAR | Status: AC
Start: 2023-04-29 — End: 2023-04-29
  Administered 2023-04-29: 30 mg via INTRAVENOUS
  Filled 2023-04-29: qty 2

## 2023-04-29 MED ORDER — LIDOCAINE 5 % EX PTCH: 1.0000 | MEDICATED_PATCH | CUTANEOUS | 4 refills | Status: DC

## 2023-04-29 MED ORDER — HYDROMORPHONE HCL 2 MG PO TABS: 2.0000 mg | ORAL_TABLET | ORAL | 0 refills | Status: DC | PRN

## 2023-04-29 MED ORDER — FENTANYL 25 MCG/HR TD PT72: 1.0000 | MEDICATED_PATCH | TRANSDERMAL | 0 refills | Status: DC

## 2023-04-29 MED ORDER — HYDROMORPHONE HCL 1 MG/ML IJ SOLN
2.0000 mg | Freq: Once | INTRAMUSCULAR | Status: AC
  Administered 2023-04-29: 2 mg via INTRAVENOUS
  Filled 2023-04-29: qty 2

## 2023-04-29 MED ORDER — SODIUM CHLORIDE 0.9 % IV BOLUS
1000.0000 mL | Freq: Once | INTRAVENOUS | Status: AC
  Administered 2023-04-29: 1000 mL via INTRAVENOUS

## 2023-04-29 MED ORDER — IOHEXOL 350 MG/ML SOLN
75.0000 mL | Freq: Once | INTRAVENOUS | Status: AC | PRN
  Administered 2023-04-29: 75 mL via INTRAVENOUS

## 2023-04-29 MED ORDER — OXYCODONE-ACETAMINOPHEN 5-325 MG PO TABS: 2.0000 | ORAL_TABLET | Freq: Four times a day (QID) | ORAL | 0 refills | Status: DC | PRN

## 2023-04-29 NOTE — ED Notes (Signed)
 This RN reviewed discharge instructions with patient. He verbalized understanding and denied any further questions. PT well appearing upon discharge and reports 10/10 pain in right abdominal area. Pt ambulated with stable gait to exit. Pt endorses ride home with family.

## 2023-04-29 NOTE — Telephone Encounter (Signed)
 Call received from patient stating that he is currently in the ER at [facility] with uncontrolled pain to his right abdomen and is being discharged.  Dr. Myna Hidalgo notified.  Pt instructed to come to this office once he is discharged from the ER to see Dr Myna Hidalgo per order of Dr. Myna Hidalgo.

## 2023-04-29 NOTE — ED Provider Notes (Signed)
  Physical Exam  BP 120/77   Pulse 70   Temp 97.8 F (36.6 C)   Resp (!) 21   Ht 5\' 11"  (1.803 m)   Wt 111.1 kg   SpO2 98%   BMI 34.17 kg/m   Physical Exam Vitals and nursing note reviewed.  Constitutional:      General: He is not in acute distress.    Appearance: He is not toxic-appearing.  HENT:     Head: Normocephalic and atraumatic.  Eyes:     General: No scleral icterus.    Conjunctiva/sclera: Conjunctivae normal.  Cardiovascular:     Rate and Rhythm: Normal rate and regular rhythm.     Pulses: Normal pulses.     Heart sounds: Normal heart sounds.  Pulmonary:     Effort: Pulmonary effort is normal.  Abdominal:     General: Abdomen is flat. Bowel sounds are normal. There is no distension.     Palpations: Abdomen is soft. There is no mass.     Tenderness: There is no abdominal tenderness.  Skin:    General: Skin is warm and dry.     Findings: No lesion.  Neurological:     General: No focal deficit present.     Mental Status: He is alert and oriented to person, place, and time. Mental status is at baseline.     Procedures  Procedures  ED Course / MDM    Medical Decision Making Amount and/or Complexity of Data Reviewed Labs: ordered. Radiology: ordered.  Risk Prescription drug management.     Patient arriving with abdominal pain for 5 days. Received in sign off from prior ED PA. IN short has stage IV lung cancer with metastasis to right adrenal gland s/p radiation and on chemo. Dispo pending scan. Vitals have been stable throughout stay, workup so far reassuring and anticipate stable for discharge. Dose have home oxygen, he is currently requiring oxygen, but within his baseline.  On reassessment patient reports he is still having pain, he would like to try more IV pain medication, rating pain 7/10. He is unable to localize pain. Reports it is all over his abdomin. Abdominal is soft, and nontender to palpation. No mass. No visible rash. Still waiting for CT  chest/abd/pelvis.  Will order more pain medicine. Patient and family member do feel quite frustrated.  Specifically patient's family member reporting she feels Kysean complaint and pain has not been taken seriously now or by Doctors in the past -- denies any specific concern.   Ct scan shows stable right adrenal mass and chololithiasis, no acute findings. Patient without significant RUQ pain with palpation. LFT within normal limits, total bilirubin 1.1. Patient reports he already knew about the gall stones. Urine without evidence of infection. I feel his pain is likely secondary to cancer and without acute finding on CT scan feel his is appropriate for discharge. Patient and family member agree to this plan and have no further questions. He is stable for discharge, reports his pain has improved after second IV morphine. He wants to be discharged home. He has appropriate follow up. Plan for pain control at home and follow up with PCP and hem/onc. Vitals stable.     Smitty Knudsen, PA-C 04/29/23 1610    Melene Plan, DO 04/29/23 1053

## 2023-04-29 NOTE — Progress Notes (Signed)
 John [name] + Hematology and Oncology Follow Up Visit  John [name] [id number] 1960/08/19 63 y.o. 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 3 months --next dose in 06/2023  XRT -right adrenal gland.  2 finish on 04/04/2023   Interim History:  John [name] is here today for an early visit.  He is not doing well at all.  Is having quite a bit of pain.  This is more so over on the right side.  He has she was seen in Radiation Oncology yesterday.  He was having severe pain at that time.  They went ahead and ordered a CT scan on him.  He unfortunately seem to get worse.  His wife took him to the ER this morning.  He had a CAT scan done.  This did not show any growth of his cancer.  He had no liver issues.  He has some slight pulmonary fibrosis.  There is no pneumothorax.  He had no skeletal issues.  He had the cement augmentation did T10 and L5 lesions.  I just feel bad for him.  Somehow, I have to think that this might be from the malignancy.  He really needs to have a long-term pain medication program.  He needs something that is time-released.  He has had no fever.  He has had no cough or increased shortness of breath.  He has had no problems with recurrent atrial fibrillation.  He has had no bleeding.  He has had no problems with bowels or bladder.  He is on Eliquis.  He has lost some weight because he is not hungry because of pain.  Currently, I would say his performance status is probably ECOG 2.  Medications:  Allergies as of 04/29/2023       Reactions   Ventolin [albuterol] Other (See Comments)    Patient has had episode of atrial fib following administration of albuterol (tolerates Xopenex well)   Lipitor [atorvastatin] Other (See Comments)   Arthralgias         Medication List        Accurate as of April 29, 2023 10:54 AM. If you have any questions, ask your nurse or doctor.          acetaminophen 500 MG tablet Commonly known as: TYLENOL Take 500 mg by mouth daily as needed for moderate pain (pain score 4-6), fever or headache.   amiodarone 200 MG tablet Commonly known as: PACERONE START by taking 2 tablets (400mg ) daily for 2 weeks THEN reduce to 1 tablet (200mg ) daily What changed: how much to take   Eliquis 5 MG Tabs tablet Generic drug: apixaban TAKE 1 TABLET(5 MG) BY MOUTH TWICE DAILY   methimazole 5 MG tablet Commonly known as: TAPAZOLE Take 1 tablet (5 mg total) by mouth daily.   metoprolol succinate 25 MG 24 hr tablet Commonly known as: TOPROL-XL TAKE 1 TABLET(25 MG) BY MOUTH DAILY   oxyCODONE 5 MG immediate release tablet Commonly known as: Oxy IR/ROXICODONE Take 1 tablet (5 mg total) by mouth every 4 (four) hours as needed for  severe pain (pain score 7-10).   oxyCODONE-acetaminophen 5-325 MG tablet Commonly known as: PERCOCET/ROXICET Take 2 tablets by mouth every 6 (six) hours as needed for severe pain (pain score 7-10).   PARoxetine 10 MG tablet Commonly known as: PAXIL Take 1 tablet (10 mg total) by mouth at bedtime.   predniSONE 5 MG tablet Commonly known as: DELTASONE Take 1 tablet (5 mg total) by mouth daily with breakfast.   Repatha SureClick 140 MG/ML Soaj Generic drug: Evolocumab ADMINISTER 1 ML UNDER THE SKIN EVERY 14 DAYS   Trelegy Ellipta 100-62.5-25 MCG/ACT Aepb Generic drug: Fluticasone-Umeclidin-Vilant INHALE 1 PUFF INTO THE LUNGS DAILY        Allergies:  Allergies  Allergen Reactions   Ventolin [Albuterol] Other (See Comments)    Patient has had episode of atrial fib following administration of albuterol  (tolerates Xopenex well)   Lipitor [Atorvastatin] Other (See Comments)    Arthralgias     Past Medical History, Surgical history, Social history, and Family History were reviewed and updated.  Review of Systems: Review of Systems  Constitutional:  Positive for malaise/fatigue.  HENT: Negative.    Eyes: Negative.   Respiratory:  Positive for cough and shortness of breath.   Cardiovascular:  Positive for chest pain.  Gastrointestinal: Negative.   Genitourinary: Negative.   Musculoskeletal:  Positive for joint pain.  Skin: Negative.   Neurological:  Positive for focal weakness.  Endo/Heme/Allergies: Negative.   Psychiatric/Behavioral: Negative.       Physical Exam:  Temperature is 98.2.  Pulse 87.  Blood pressure 93/49.  Weight is 243 pounds.    Wt Readings from Last 3 Encounters:  04/29/23 243 lb (110.2 kg)  04/29/23 245 lb (111.1 kg)  04/28/23 242 lb 12.8 oz (110.1 kg)    Physical Exam Vitals reviewed.  HENT:     Head: Normocephalic and atraumatic.  Eyes:     Pupils: Pupils are equal, round, and reactive to light.  Cardiovascular:     Rate and Rhythm: Normal rate and regular rhythm.     Heart sounds: Normal heart sounds.  Pulmonary:     Effort: Pulmonary effort is normal.     Breath sounds: Normal breath sounds.  Abdominal:     General: Bowel sounds are normal.     Palpations: Abdomen is soft.  Musculoskeletal:        General: No tenderness or deformity. Normal range of motion.     Cervical back: Normal range of motion.  Lymphadenopathy:     Cervical: No cervical adenopathy.  Skin:    General: Skin is warm and dry.     Findings: No erythema or rash.  Neurological:     Mental Status: He is alert and oriented to person, place, and time.  Psychiatric:        Behavior: Behavior normal.        Thought Content: Thought content normal.        Judgment: Judgment normal.    Lab Results  Component Value Date   WBC 2.9 (L) 04/29/2023   HGB 14.2 04/29/2023    HCT 41.8 04/29/2023   MCV 84.6 04/29/2023   PLT 170 04/29/2023   Lab Results  Component Value Date   FERRITIN 446 (H) 08/23/2021   IRON 189 (H) 08/26/2021   TIBC 213 (L) 08/26/2021   UIBC 24 08/26/2021   IRONPCTSAT 89 (H) 08/26/2021   Lab Results  Component Value Date   RETICCTPCT 1.9 08/23/2021   RBC 4.94 04/29/2023   No  results found for: "KPAFRELGTCHN", "LAMBDASER", "KAPLAMBRATIO" No results found for: "IGGSERUM", "IGA", "IGMSERUM" No results found for: "TOTALPROTELP", "ALBUMINELP", "A1GS", "A2GS", "BETS", "BETA2SER", "GAMS", "MSPIKE", "SPEI"   Chemistry      Component Value Date/Time   NA 129 (L) 04/29/2023 0319   NA 139 04/12/2023 0937   K 4.1 04/29/2023 0319   CL 99 04/29/2023 0319   CO2 20 (L) 04/29/2023 0319   BUN 9 04/29/2023 0319   BUN 12 04/12/2023 0937   CREATININE 1.09 04/29/2023 0319   CREATININE 1.05 04/28/2023 0956      Component Value Date/Time   CALCIUM 8.5 (L) 04/29/2023 0319   ALKPHOS 53 04/29/2023 0511   AST 35 04/29/2023 0511   AST 28 04/28/2023 0956   ALT 28 04/29/2023 0511   ALT 22 04/28/2023 0956   BILITOT 1.1 04/29/2023 0511   BILITOT 1.1 04/28/2023 0956       Impression and Plan: Mr. Dunwoody is a very pleasant 63 yo gentleman with metastatic squamous cell carcinoma of the right lung.  The tumor had high PD-L1 score.  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  He had completed radiation therapy about 3 weeks ago.  As such, I really do not think this is an issue.  Again, our goal here is quality of life.  We are to have to get him on something that is time-released.  Hopefully, this will help with his discomfort.  I will see about a Duragesic patch for him.  I think this might be a good idea for time-released pain medication.  Also think that because this pain seems to be localized, a Lidoderm patch may not be a bad idea.  Of note, he said that this all started when he started taking Tapazole  for hyperthyroidism.  He says is not taking any Tapazole now.  I am going to give him some IV fluid in the office.  I will give him some IV Toradol and some IV Dilaudid.  We will see if this may help a little bit.  Hopefully, it will break the pain cycle so we can at least have some quality of life when he gets home and enjoy the weekend.  I suppose to see him back in about 10 days.  We will keep that appointment.    Josph Macho, MD 3/14/202510:54 AM

## 2023-04-29 NOTE — ED Provider Notes (Signed)
 [place] EMERGENCY DEPARTMENT AT [facility] Provider Note   CSN: [id number] Arrival date & time: 04/29/23  0305     History  Chief Complaint  Patient presents with   Flank Pain    John [name] is a 63 y.o. male with known history of stage IV lung cancer s/p radiation currently on chemotherapy presents with concern for 5 days of progressively worsening right flank pain and generalized abdominal pain without nausea or vomiting.  He is anticoagulated on Eliquis for atrial fibrillation, has outpatient CT scans ordered but pain was refractory to oral oxycodone at home prompting ED visit tonight.  Patient's oxygen saturation per patient typically resides around 94% on room air, he was hypoxic to 88% at time arrival was placed on nasal cannula prior to my arrival to the bedside.  In addition to the above listed history patient has history of RA.   HPI     Home Medications Prior to Admission medications   Medication Sig Start Date End Date Taking? Authorizing Provider  acetaminophen (TYLENOL) 500 MG tablet Take 500 mg by mouth daily as needed for moderate pain (pain score 4-6), fever or headache.    [provider]  amiodarone (PACERONE) 200 MG tablet START by taking 2 tablets (400mg ) daily for 2 weeks THEN reduce to 1 tablet (200mg ) daily Patient taking differently: Take 200 mg by mouth daily. 04/02/23   Sheilah Pigeon, PA-C  ELIQUIS 5 MG TABS tablet TAKE 1 TABLET(5 MG) BY MOUTH TWICE DAILY 10/22/22   Turner, Cornelious Bryant, MD  Evolocumab (REPATHA SURECLICK) 140 MG/ML SOAJ ADMINISTER 1 ML UNDER THE SKIN EVERY 14 DAYS 12/24/22   Quintella Reichert, MD  methimazole (TAPAZOLE) 5 MG tablet Take 1 tablet (5 mg total) by mouth daily. 04/12/23   Annett Fabian, MD  metoprolol succinate (TOPROL-XL) 25 MG 24 hr tablet TAKE 1 TABLET(25 MG) BY MOUTH DAILY 12/06/22   Quintella Reichert, MD  oxyCODONE (OXY IR/ROXICODONE) 5 MG immediate release tablet Take 1 tablet (5 mg total) by mouth every 4  (four) hours as needed for severe pain (pain score 7-10). 04/28/23   Antony Blackbird, MD  PARoxetine (PAXIL) 10 MG tablet Take 1 tablet (10 mg total) by mouth at bedtime. 01/11/23   Annett Fabian, MD  predniSONE (DELTASONE) 5 MG tablet Take 1 tablet (5 mg total) by mouth daily with breakfast. 08/18/22   Josph Macho, MD  TRELEGY ELLIPTA 100-62.5-25 MCG/ACT AEPB INHALE 1 PUFF INTO THE LUNGS DAILY 12/04/22   Leslye Peer, MD  prochlorperazine (COMPAZINE) 10 MG tablet Take 1 tablet (10 mg total) by mouth every 6 (six) hours as needed (Nausea or vomiting). Patient not taking: Reported on 06/29/2021 01/29/21 07/20/21  Josph Macho, MD      Allergies    Ventolin [albuterol] and Lipitor [atorvastatin]    Review of Systems   Review of Systems  Constitutional: Negative.   HENT: Negative.    Respiratory:  Positive for shortness of breath.   Gastrointestinal:  Positive for abdominal pain. Negative for diarrhea, nausea and vomiting.  Genitourinary:  Positive for flank pain. Negative for decreased urine volume, difficulty urinating, penile discharge, penile pain, penile swelling, scrotal swelling, testicular pain and urgency.    Physical Exam Updated Vital Signs BP 120/77   Pulse 70   Temp 97.8 F (36.6 C)   Resp (!) 21   Ht 5\' 11"  (1.803 m)   Wt 111.1 kg   SpO2 98%   BMI 34.17 kg/m  Physical Exam Vitals and nursing note reviewed.  Constitutional:      Appearance: He is obese. He is not toxic-appearing.  HENT:     Head: Normocephalic and atraumatic.     Mouth/Throat:     Mouth: Mucous membranes are moist.     Pharynx: No oropharyngeal exudate or posterior oropharyngeal erythema.  Eyes:     General:        Right eye: No discharge.        Left eye: No discharge.     Conjunctiva/sclera: Conjunctivae normal.  Cardiovascular:     Rate and Rhythm: Normal rate and regular rhythm.     Pulses: Normal pulses.     Heart sounds: Normal heart sounds. No murmur heard. Pulmonary:      Effort: Pulmonary effort is normal. No tachypnea, bradypnea, accessory muscle usage or respiratory distress.     Breath sounds: No decreased air movement. Examination of the left-middle field reveals rhonchi. Examination of the left-lower field reveals rhonchi. Rhonchi present. No wheezing or rales.     Comments: On 2L supplemental O2 by [place] with improvement in O2 sat.  Abdominal:     General: Bowel sounds are normal. There is no distension.     Palpations: Abdomen is soft.     Tenderness: There is no abdominal tenderness. There is no right CVA tenderness, left CVA tenderness, guarding or rebound.  Musculoskeletal:        General: No deformity.     Cervical back: Neck supple.     Right lower leg: No edema.     Left lower leg: No edema.  Skin:    General: Skin is warm and dry.     Capillary Refill: Capillary refill takes less than 2 seconds.  Neurological:     General: No focal deficit present.     Mental Status: He is alert and oriented to person, place, and time. Mental status is at baseline.  Psychiatric:        Mood and Affect: Mood normal.     ED Results / Procedures / Treatments   Labs (all labs ordered are listed, but only abnormal results are displayed) Labs Reviewed  BASIC METABOLIC PANEL - Abnormal; Notable for the following components:      Result Value   Sodium 129 (*)    CO2 20 (*)    Glucose, Bld 119 (*)    Calcium 8.5 (*)    All other components within normal limits  CBC - Abnormal; Notable for the following components:   WBC 2.9 (*)    All other components within normal limits  HEPATIC FUNCTION PANEL - Abnormal; Notable for the following components:   Albumin 3.2 (*)    Bilirubin, Direct 0.3 (*)    All other components within normal limits  PROTIME-INR - Abnormal; Notable for the following components:   Prothrombin Time 15.8 (*)    All other components within normal limits  URINE CULTURE  URINALYSIS, ROUTINE W REFLEX MICROSCOPIC     EKG None  Radiology DG Chest Port 1 View Result Date: 04/29/2023 CLINICAL DATA:  Atrial fibrillation EXAM: PORTABLE CHEST 1 VIEW COMPARISON:  03/28/2023 FINDINGS: Low volume chest with asymmetric opacification and volume loss on the right distorting the mediastinum, history of treated lung cancer on the right. No cardiac enlargement when compared to recent CT, there is mediastinal fat expansion causing a widened radiographic appearance. No acute airspace disease, effusion, or pneumothorax. IMPRESSION: Stable low volume, post treatment chest. Electronically Signed  By: Tiburcio Pea M.D.   On: 04/29/2023 04:42    Procedures Procedures    Medications Ordered in ED Medications  morphine (PF) 4 MG/ML injection 4 mg (4 mg Intravenous Given 04/29/23 0519)  ondansetron (ZOFRAN) injection 4 mg (4 mg Intravenous Given 04/29/23 0519)  sodium chloride 0.9 % bolus 1,000 mL (1,000 mLs Intravenous New Bag/Given 04/29/23 0526)  iohexol (OMNIPAQUE) 350 MG/ML injection 75 mL (75 mLs Intravenous Contrast Given 04/29/23 0554)    ED Course/ Medical Decision Making/ A&P                                 Medical Decision Making 63 year old male with stage IV lung cancer who presents with concern for abdominal and flank pain.  Nontoxic-appearing.  Hypoxic on intake and placed on 2 L of naloxone by nasal cannula.  Cardiac exam unremarkable, abdominal exam is with coarse breath sounds in the right mid and lower lung fields, left lung fields are unremarkable.  Abdominal exam is unremarkable.  DDx includes but is not limited to metastatic disease, bowel obstruction, colitis, cholecystitis, appendicitis, gastritis, incarcerated hernia, nephrolithiasis, obstructive uropathy, pyelonephritis, AAA, mesenteric ischemia, pneumonia, PE.  Anticoagulated on Eliquis.  Amount and/or Complexity of Data Reviewed Labs: ordered.    Details: CBC with leukopenia 2.9.  BMP with hyponatremia 129, CO2 of 20.  Normal hepatic  function.  INR is normal.  Urine studies pending at this time.   Radiology: ordered.    Details: Chest x-ray stable for patient, images visualized this provider.     ECG/medicine tests:     Details: EKG with normal sinus rhythm without ischemic changes.  Risk Prescription drug management.   Care of this patient signed out to oncoming ED provider Barrett, PA-C at time of shift change. All pertinent HPI, physical exam, and laboratory findings were discussed with them prior to my departure. Disposition of patient pending completion of workup, reevaluation, and clinical judgement of oncoming ED provider.   Lasaro and his wife voiced understanding of his medical evaluation and treatment plan thus far. Each of their questions answered to their expressed satisfaction.    This chart was dictated using voice recognition software, Dragon. Despite the best efforts of this provider to proofread and correct errors, errors may still occur which can change documentation meaning.   This chart was dictated using voice recognition software, Dragon. Despite the best efforts of this provider to proofread and correct errors, errors may still occur which can change documentation meaning.         Final Clinical Impression(s) / ED Diagnoses Final diagnoses:  None    Rx / DC Orders ED Discharge Orders     None         Sherrilee Gilles 04/29/23 1610    Shon Baton, MD 04/29/23 2332

## 2023-04-29 NOTE — Discharge Instructions (Addendum)
 Follow up with oncology. Continue taking Prilosec. Take Zofran for nausea. You can try ice and heat, or topical pain medication.  You can try Oxycodone 10mg  and Tylenol every 6 hours. I have sent Oxycodone to your pharmacy. Do not exceed 1000mg  of Tylenol every 6 hours.  Your CT scan shows gall stones without sign of infection.  Return with new or worsening symptoms.

## 2023-04-29 NOTE — ED Triage Notes (Addendum)
 Pt c/o right flank pain and feeling like he is going in and out of afib. Pt states pain started 5 days ago. Pt has had nausea and vomiting, denies diarrhea or constipation.Pt states he has taken tylenol and oxycodone prior to arrival.

## 2023-04-30 LAB — URINE CULTURE: Culture: <10000 — AB

## 2023-05-02 ENCOUNTER — Inpatient Hospital Stay: Payer: Managed Care, Other (non HMO)

## 2023-05-02 ENCOUNTER — Ambulatory Visit: Payer: Managed Care, Other (non HMO) | Admitting: Hematology & Oncology

## 2023-05-05 ENCOUNTER — Inpatient Hospital Stay: Admitting: Dietician

## 2023-05-05 NOTE — Progress Notes (Signed)
 Nutrition Assessment   Reason for Assessment: MST screen for weight loss.    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 Myna Hidalgo.  He reports recent weight loss r/t pain, lack of appetite and early satiety.  He admits to eating on run and lots of fast foods, also skips breakfast sometimes but loves eggs.  Had been followed previously by RDs with history of ONS and he did enjoy them as a means to improve his protein and nutrient intake.     Anthropometrics: down 13# past month  Height: 71" Weight: 243# UBW: 256# BMI: 33.89    NUTRITION DIAGNOSIS: Inadequate PO intake to meet increased nutrient needs, r/t cancer diagnosis   INTERVENTION:  Relayed that nutrition services are wrap around service provided at no charge and encouraged continued communication if experiencing continued weight loss or any nutritional impact symptoms (NIS). Educated on importance of adequate calorie and protein energy intake  with nutrient dense foods when possible to maintain weight/strength Encouraged more frequent feeds and additional snacking Suggested oral nutrition supplements and to asked for coupons at [facility] Emailed Nutrition Tip sheet  for  High Protein High Calorie Snacking with Contact information provided in email and text   MONITORING, EVALUATION, GOAL: weight, PO intake, Nutrition Impact Symptoms, labs Goal is weight maintenance  Next Visit: PRN at patient or provider request  Gennaro Africa, RDN, LDN Registered Dietitian, [facility] Part Time Remote (Usual office hours: Tuesday-Thursday) Cell: [phone]

## 2023-05-09 ENCOUNTER — Encounter: Payer: Self-pay | Admitting: Hematology & Oncology

## 2023-05-09 ENCOUNTER — Inpatient Hospital Stay (HOSPITAL_BASED_OUTPATIENT_CLINIC_OR_DEPARTMENT_OTHER): Admitting: Hematology & Oncology

## 2023-05-09 ENCOUNTER — Ambulatory Visit: Payer: Managed Care, Other (non HMO) | Admitting: Hematology & Oncology

## 2023-05-09 ENCOUNTER — Inpatient Hospital Stay: Payer: Managed Care, Other (non HMO)

## 2023-05-09 ENCOUNTER — Other Ambulatory Visit: Payer: Self-pay

## 2023-05-09 ENCOUNTER — Inpatient Hospital Stay

## 2023-05-09 VITALS — BP 90/62 | HR 91 | Temp 98.1°F | Resp 20 | Ht 71.0 in | Wt 249.0 lb

## 2023-05-09 DIAGNOSIS — Z923 Personal history of irradiation: Secondary | ICD-10-CM | POA: Diagnosis not present

## 2023-05-09 DIAGNOSIS — E0590 Thyrotoxicosis, unspecified without thyrotoxic crisis or storm: Secondary | ICD-10-CM | POA: Insufficient documentation

## 2023-05-09 DIAGNOSIS — R52 Pain, unspecified: Secondary | ICD-10-CM | POA: Insufficient documentation

## 2023-05-09 DIAGNOSIS — C3491 Malignant neoplasm of unspecified part of right bronchus or lung: Secondary | ICD-10-CM

## 2023-05-09 DIAGNOSIS — C779 Secondary and unspecified malignant neoplasm of lymph node, unspecified: Secondary | ICD-10-CM | POA: Diagnosis not present

## 2023-05-09 DIAGNOSIS — C7970 Secondary malignant neoplasm of unspecified adrenal gland: Secondary | ICD-10-CM | POA: Diagnosis not present

## 2023-05-09 DIAGNOSIS — C3411 Malignant neoplasm of upper lobe, right bronchus or lung: Secondary | ICD-10-CM

## 2023-05-09 DIAGNOSIS — C7971 Secondary malignant neoplasm of right adrenal gland: Secondary | ICD-10-CM | POA: Insufficient documentation

## 2023-05-09 DIAGNOSIS — Z7901 Long term (current) use of anticoagulants: Secondary | ICD-10-CM | POA: Insufficient documentation

## 2023-05-09 DIAGNOSIS — J8410 Pulmonary fibrosis, unspecified: Secondary | ICD-10-CM | POA: Diagnosis not present

## 2023-05-09 DIAGNOSIS — C3400 Malignant neoplasm of unspecified main bronchus: Secondary | ICD-10-CM

## 2023-05-09 LAB — CMP (CANCER CENTER ONLY)
ALT: 15 U/L (ref 0–44)
AST: 17 U/L (ref 15–41)
Albumin: 3.8 g/dL (ref 3.5–5.0)
Alkaline Phosphatase: 69 U/L (ref 38–126)
Anion gap: 8 (ref 5–15)
BUN: 8 mg/dL (ref 8–23)
CO2: 27 mmol/L (ref 22–32)
Calcium: 8.9 mg/dL (ref 8.9–10.3)
Chloride: 103 mmol/L (ref 98–111)
Creatinine: 1.19 mg/dL (ref 0.61–1.24)
GFR, Estimated: >60 mL/min (ref >60)
Glucose, Bld: 95 mg/dL (ref 70–99)
Potassium: 4.4 mmol/L (ref 3.5–5.1)
Sodium: 138 mmol/L (ref 135–145)
Total Bilirubin: 0.9 mg/dL (ref 0.0–1.2)
Total Protein: 6.8 g/dL (ref 6.5–8.1)

## 2023-05-09 LAB — CBC WITH DIFFERENTIAL (CANCER CENTER ONLY)
Abs Immature Granulocytes: 0.01 10*3/uL (ref 0.00–0.07)
Basophils Absolute: 0 10*3/uL (ref 0.0–0.1)
Basophils Relative: 1 %
Eosinophils Absolute: 0.2 10*3/uL (ref 0.0–0.5)
Eosinophils Relative: 5 %
HCT: 41.4 % (ref 39.0–52.0)
Hemoglobin: 13.8 g/dL (ref 13.0–17.0)
Immature Granulocytes: 0 %
Lymphocytes Relative: 24 %
Lymphs Abs: 0.8 10*3/uL (ref 0.7–4.0)
MCH: 29 pg (ref 26.0–34.0)
MCHC: 33.3 g/dL (ref 30.0–36.0)
MCV: 87 fL (ref 80.0–100.0)
Monocytes Absolute: 0.6 10*3/uL (ref 0.1–1.0)
Monocytes Relative: 18 %
Neutro Abs: 1.7 10*3/uL (ref 1.7–7.7)
Neutrophils Relative %: 52 %
Platelet Count: 242 10*3/uL (ref 150–400)
RBC: 4.76 MIL/uL (ref 4.22–5.81)
RDW: 15.4 % (ref 11.5–15.5)
WBC Count: 3.3 10*3/uL — ABNORMAL LOW (ref 4.0–10.5)
nRBC: 0 % (ref 0.0–0.2)

## 2023-05-09 LAB — LACTATE DEHYDROGENASE: LDH: 225 U/L — ABNORMAL HIGH (ref 98–192)

## 2023-05-10 ENCOUNTER — Encounter: Payer: Self-pay | Admitting: Internal Medicine

## 2023-05-10 ENCOUNTER — Encounter: Payer: Self-pay | Admitting: Hematology & Oncology

## 2023-05-10 NOTE — Progress Notes (Signed)
 DISCONTINUE OFF PATHWAY REGIMEN - Non-Small Cell Lung   [id number]:Docetaxel 75 mg/m2:   A cycle is every 21 days:     Docetaxel   **Always confirm dose/schedule in your pharmacy ordering system**  PRIOR TREATMENT: Off Pathway: Docetaxel 75 mg/m2  START OFF PATHWAY REGIMEN - Non-Small Cell Lung   [id number]:Gemcitabine 1,000 mg/m2 IV D1,8 + Vinorelbine 25 mg/m2 IV D1,8 q21 Days:   A cycle is every 21 days:     Gemcitabine      Vinorelbine   **Always confirm dose/schedule in your pharmacy ordering system**  Patient Characteristics: Stage IV Metastatic, Squamous, Molecular Analysis Completed, Alteration Present and Targeted Therapy Exhausted or EGFR Exon 20 Insertion or KRAS G12C or HER2 Present, and No Prior Chemo/Immunotherapy or No Alteration Present, PS = 0, 1, Second Line -  Chemotherapy/Immunotherapy, No Prior PD-1/PD-L1  Inhibitor and Immunotherapy Candidate Therapeutic Status: Stage IV Metastatic Histology: Squamous Cell Molecular Analysis Results: No Alteration Present ECOG Performance Status: 1 Chemotherapy/Immunotherapy Line of Therapy: Second Line Chemotherapy/Immunotherapy Immunotherapy Candidate Status: Candidate for Immunotherapy Prior Immunotherapy Status: No Prior PD-1/PD-L1 Inhibitor Intent of Therapy: Non-Curative / Palliative Intent, Discussed with Patient

## 2023-05-10 NOTE — Progress Notes (Signed)
 [facility] Hematology and Oncology Follow Up Visit  Melinda Pottinger [id number] 1960-12-01 63 y.o. 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 3 months --next dose in 06/2023  XRT -right adrenal gland.  2 finish on 04/04/2023 Gemzar/Navelbine/Libtayo -start cycle 1 on 05/26/2023    Interim History:  Mr. Genet is here today for follow-up.  He is now ready for treatment.  He would like to have treatment.  His pain still is quite bad.  He still has not started the Duragesic patch.  Hopefully, he will start the Duragesic patch.  Hopefully this will help with some of the pain.  Hopefully, we can get him on treatment.  I think that we can get him on combination of chemotherapy and immunotherapy.  Think that Gemzar/Navelbine along with Lucianne Lei would not be a bad idea for him.  I think he could tolerate this.  I think he would be able to respond.  Again, this is all about quality of life.  I would like to have his quality of life as good as possible.  I know he is fighting hard.  I know his other health issues that he is trying to deal with.  He has had no problems with fever.  He has had no bleeding.  There is been no change in bowel or bladder habits.  Currently, I would say his performance status is probably ECOG 1-2.   Medications:  Allergies as of 05/09/2023       Reactions   Ventolin [albuterol] Other (See Comments)   Patient has had episode of atrial fib following administration of albuterol (tolerates Xopenex well)   Lipitor [atorvastatin] Other (See Comments)   Arthralgias         Medication List        Accurate as of May 09, 2023 11:59 PM. If you have  any questions, ask your nurse or doctor.          acetaminophen 500 MG tablet Commonly known as: TYLENOL Take 500 mg by mouth daily as needed for moderate pain (pain score 4-6), fever or headache.   amiodarone 200 MG tablet Commonly known as: PACERONE START by taking 2 tablets (400mg ) daily for 2 weeks THEN reduce to 1 tablet (200mg ) daily What changed: how much to take   Eliquis 5 MG Tabs tablet Generic drug: apixaban TAKE 1 TABLET(5 MG) BY MOUTH TWICE DAILY   fentaNYL 25 MCG/HR Commonly known as: DURAGESIC Place 1 patch onto the skin every 3 (three) days.   HYDROmorphone 2 MG tablet Commonly known as: Dilaudid Take 1 tablet (2 mg total) by mouth every 4 (four) hours as needed for severe pain (pain score 7-10).   lidocaine 5 % Commonly known as: LIDODERM Place 1 patch onto the skin daily. Remove & Discard patch within 12 hours or as directed by MD   metoprolol succinate 25 MG 24 hr tablet Commonly known as: TOPROL-XL TAKE 1 TABLET(25 MG) BY MOUTH DAILY   PARoxetine 10 MG tablet Commonly known as: PAXIL Take 1 tablet (10 mg total) by mouth at bedtime.   predniSONE 5 MG tablet Commonly known as:  DELTASONE Take 1 tablet (5 mg total) by mouth daily with breakfast.   Repatha SureClick 140 MG/ML Soaj Generic drug: Evolocumab ADMINISTER 1 ML UNDER THE SKIN EVERY 14 DAYS   Trelegy Ellipta 100-62.5-25 MCG/ACT Aepb Generic drug: Fluticasone-Umeclidin-Vilant INHALE 1 PUFF INTO THE LUNGS DAILY        Allergies:  Allergies  Allergen Reactions   Ventolin [Albuterol] Other (See Comments)    Patient has had episode of atrial fib following administration of albuterol (tolerates Xopenex well)   Lipitor [Atorvastatin] Other (See Comments)    Arthralgias     Past Medical History, Surgical history, Social history, and Family History were reviewed and updated.  Review of Systems: Review of Systems  Constitutional:  Positive for malaise/fatigue.  HENT: Negative.     Eyes: Negative.   Respiratory:  Positive for cough and shortness of breath.   Cardiovascular:  Positive for chest pain.  Gastrointestinal: Negative.   Genitourinary: Negative.   Musculoskeletal:  Positive for joint pain.  Skin: Negative.   Neurological:  Positive for focal weakness.  Endo/Heme/Allergies: Negative.   Psychiatric/Behavioral: Negative.       Physical Exam:  Temperature is 98.1.  Pulse 91.  Blood pressure 90/62.  Weight is 249 pounds.    Wt Readings from Last 3 Encounters:  05/09/23 249 lb (112.9 kg)  04/29/23 243 lb (110.2 kg)  04/29/23 245 lb (111.1 kg)    Physical Exam Vitals reviewed.  HENT:     Head: Normocephalic and atraumatic.  Eyes:     Pupils: Pupils are equal, round, and reactive to light.  Cardiovascular:     Rate and Rhythm: Normal rate and regular rhythm.     Heart sounds: Normal heart sounds.  Pulmonary:     Effort: Pulmonary effort is normal.     Breath sounds: Normal breath sounds.  Abdominal:     General: Bowel sounds are normal.     Palpations: Abdomen is soft.  Musculoskeletal:        General: No tenderness or deformity. Normal range of motion.     Cervical back: Normal range of motion.  Lymphadenopathy:     Cervical: No cervical adenopathy.  Skin:    General: Skin is warm and dry.     Findings: No erythema or rash.  Neurological:     Mental Status: He is alert and oriented to person, place, and time.  Psychiatric:        Behavior: Behavior normal.        Thought Content: Thought content normal.        Judgment: Judgment normal.     Lab Results  Component Value Date   WBC 3.3 (L) 05/09/2023   HGB 13.8 05/09/2023   HCT 41.4 05/09/2023   MCV 87.0 05/09/2023   PLT 242 05/09/2023   Lab Results  Component Value Date   FERRITIN 446 (H) 08/23/2021   IRON 189 (H) 08/26/2021   TIBC 213 (L) 08/26/2021   UIBC 24 08/26/2021   IRONPCTSAT 89 (H) 08/26/2021   Lab Results  Component Value Date   RETICCTPCT 1.9 08/23/2021    RBC 4.76 05/09/2023   No results found for: "KPAFRELGTCHN", "LAMBDASER", "KAPLAMBRATIO" No results found for: "IGGSERUM", "IGA", "IGMSERUM" No results found for: "TOTALPROTELP", "ALBUMINELP", "A1GS", "A2GS", "BETS", "BETA2SER", "GAMS", "MSPIKE", "SPEI"   Chemistry      Component Value Date/Time   NA 138 05/09/2023 0836   NA 139 04/12/2023 0937   K 4.4 05/09/2023 0836   CL 103 05/09/2023 0836  CO2 27 05/09/2023 0836   BUN 8 05/09/2023 0836   BUN 12 04/12/2023 0937   CREATININE 1.19 05/09/2023 0836      Component Value Date/Time   CALCIUM 8.9 05/09/2023 0836   ALKPHOS 69 05/09/2023 0836   AST 17 05/09/2023 0836   ALT 15 05/09/2023 0836   BILITOT 0.9 05/09/2023 0836       Impression and Plan: Mr. Primo is a very pleasant 63 yo gentleman with metastatic squamous cell carcinoma of the right lung.  The tumor had high PD-L1 score.  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  We will see if we try 1 more course of therapy.  Again, we can try chemotherapy along with Libtayo, maybe we can get a response.  Hopefully this may help with his pain.  I know that the pain is really is his biggest problem.  He does not want any IV treatment today.  Again, we will see about getting him started next week.  I think this would be very reasonable.  We will plan for him to cycles of treatment and then rescan him.  I do think he would be able to tolerate treatment.  We may have to think about transfusing him at some point depending on his blood counts.  Will plan to see him back when he starts treatment.   Josph Macho, MD 3/25/20251:51 PM

## 2023-05-11 ENCOUNTER — Other Ambulatory Visit: Payer: Self-pay

## 2023-05-12 ENCOUNTER — Other Ambulatory Visit: Payer: Self-pay | Admitting: Cardiology

## 2023-05-12 DIAGNOSIS — I4819 Other persistent atrial fibrillation: Secondary | ICD-10-CM

## 2023-05-12 NOTE — Telephone Encounter (Signed)
 Prescription refill request for Eliquis received.  Indication: afib  Last office visit: 04/20/2023, [place]  Scr: 1.19, 05/09/2023 Age: 63 yo  Weight: 112.9 kg   Refill sent.

## 2023-05-13 ENCOUNTER — Encounter: Payer: Self-pay | Admitting: Hematology & Oncology

## 2023-05-16 ENCOUNTER — Other Ambulatory Visit: Payer: Self-pay | Admitting: Cardiology

## 2023-05-16 DIAGNOSIS — E782 Mixed hyperlipidemia: Secondary | ICD-10-CM

## 2023-05-16 DIAGNOSIS — E785 Hyperlipidemia, unspecified: Secondary | ICD-10-CM

## 2023-05-17 ENCOUNTER — Encounter (HOSPITAL_COMMUNITY): Admission: RE | Admit: 2023-05-17 | Source: Ambulatory Visit

## 2023-05-18 ENCOUNTER — Other Ambulatory Visit: Payer: Self-pay | Admitting: Cardiology

## 2023-05-18 ENCOUNTER — Other Ambulatory Visit (HOSPITAL_COMMUNITY)

## 2023-05-18 NOTE — Progress Notes (Signed)
 Pharmacist Chemotherapy Monitoring - Initial Assessment    Anticipated start date: 05/26/23   The following has been reviewed per standard work regarding the patient's treatment regimen: The patient's diagnosis, treatment plan and drug doses, and organ/hematologic function Lab orders and baseline tests specific to treatment regimen  The treatment plan start date, drug sequencing, and pre-medications Prior authorization status  Patient's documented medication list, including drug-drug interaction screen and prescriptions for anti-emetics and supportive care specific to the treatment regimen The drug concentrations, fluid compatibility, administration routes, and timing of the medications to be used The patient's access for treatment and lifetime cumulative dose history, if applicable  The patient's medication allergies and previous infusion related reactions, if applicable   Changes made to treatment plan:  N/A  Follow up needed:  N/A   Candelaria Stagers, [facility], 05/18/2023  8:06 AM

## 2023-05-26 ENCOUNTER — Inpatient Hospital Stay: Attending: Internal Medicine

## 2023-05-26 ENCOUNTER — Other Ambulatory Visit: Payer: Self-pay

## 2023-05-26 ENCOUNTER — Encounter: Payer: Self-pay | Admitting: Hematology & Oncology

## 2023-05-26 ENCOUNTER — Other Ambulatory Visit: Payer: Self-pay | Admitting: Oncology

## 2023-05-26 ENCOUNTER — Inpatient Hospital Stay (HOSPITAL_BASED_OUTPATIENT_CLINIC_OR_DEPARTMENT_OTHER): Admitting: Hematology & Oncology

## 2023-05-26 ENCOUNTER — Inpatient Hospital Stay

## 2023-05-26 VITALS — BP 115/71 | HR 59 | Temp 97.8°F | Resp 18

## 2023-05-26 DIAGNOSIS — Z5189 Encounter for other specified aftercare: Secondary | ICD-10-CM | POA: Diagnosis not present

## 2023-05-26 DIAGNOSIS — C7970 Secondary malignant neoplasm of unspecified adrenal gland: Secondary | ICD-10-CM | POA: Insufficient documentation

## 2023-05-26 DIAGNOSIS — C3491 Malignant neoplasm of unspecified part of right bronchus or lung: Secondary | ICD-10-CM

## 2023-05-26 DIAGNOSIS — C3400 Malignant neoplasm of unspecified main bronchus: Secondary | ICD-10-CM

## 2023-05-26 DIAGNOSIS — C779 Secondary and unspecified malignant neoplasm of lymph node, unspecified: Secondary | ICD-10-CM | POA: Diagnosis not present

## 2023-05-26 DIAGNOSIS — C3411 Malignant neoplasm of upper lobe, right bronchus or lung: Secondary | ICD-10-CM | POA: Diagnosis not present

## 2023-05-26 DIAGNOSIS — Z5111 Encounter for antineoplastic chemotherapy: Secondary | ICD-10-CM | POA: Insufficient documentation

## 2023-05-26 LAB — CMP (CANCER CENTER ONLY)
ALT: 16 U/L (ref 0–44)
AST: 17 U/L (ref 15–41)
Albumin: 3.8 g/dL (ref 3.5–5.0)
Alkaline Phosphatase: 77 U/L (ref 38–126)
Anion gap: 7 (ref 5–15)
BUN: 12 mg/dL (ref 8–23)
CO2: 28 mmol/L (ref 22–32)
Calcium: 8.8 mg/dL — ABNORMAL LOW (ref 8.9–10.3)
Chloride: 103 mmol/L (ref 98–111)
Creatinine: 1.18 mg/dL (ref 0.61–1.24)
GFR, Estimated: >60 mL/min (ref >60)
Glucose, Bld: 86 mg/dL (ref 70–99)
Potassium: 3.7 mmol/L (ref 3.5–5.1)
Sodium: 138 mmol/L (ref 135–145)
Total Bilirubin: 0.8 mg/dL (ref 0.0–1.2)
Total Protein: 6.8 g/dL (ref 6.5–8.1)

## 2023-05-26 LAB — CBC WITH DIFFERENTIAL (CANCER CENTER ONLY)
Abs Immature Granulocytes: 0.01 10*3/uL (ref 0.00–0.07)
Basophils Absolute: 0 10*3/uL (ref 0.0–0.1)
Basophils Relative: 1 %
Eosinophils Absolute: 0.2 10*3/uL (ref 0.0–0.5)
Eosinophils Relative: 7 %
HCT: 40 % (ref 39.0–52.0)
Hemoglobin: 13.3 g/dL (ref 13.0–17.0)
Immature Granulocytes: 0 %
Lymphocytes Relative: 19 %
Lymphs Abs: 0.6 10*3/uL — ABNORMAL LOW (ref 0.7–4.0)
MCH: 29.4 pg (ref 26.0–34.0)
MCHC: 33.3 g/dL (ref 30.0–36.0)
MCV: 88.3 fL (ref 80.0–100.0)
Monocytes Absolute: 0.5 10*3/uL (ref 0.1–1.0)
Monocytes Relative: 15 %
Neutro Abs: 1.9 10*3/uL (ref 1.7–7.7)
Neutrophils Relative %: 58 %
Platelet Count: 178 10*3/uL (ref 150–400)
RBC: 4.53 MIL/uL (ref 4.22–5.81)
RDW: 15.9 % — ABNORMAL HIGH (ref 11.5–15.5)
WBC Count: 3.2 10*3/uL — ABNORMAL LOW (ref 4.0–10.5)
nRBC: 0 % (ref 0.0–0.2)

## 2023-05-26 LAB — LACTATE DEHYDROGENASE: LDH: 258 U/L — ABNORMAL HIGH (ref 98–192)

## 2023-05-26 LAB — PREALBUMIN: Prealbumin: 20 mg/dL (ref 18–38)

## 2023-05-26 MED ORDER — SODIUM CHLORIDE 0.9% FLUSH: 10.0000 mL | INTRAVENOUS | Status: DC | PRN

## 2023-05-26 MED ORDER — OXYCODONE-ACETAMINOPHEN 5-325 MG PO TABS: 2.0000 | ORAL_TABLET | Freq: Four times a day (QID) | ORAL | 0 refills | Status: DC | PRN

## 2023-05-26 MED ORDER — PROCHLORPERAZINE MALEATE 10 MG PO TABS
10.0000 mg | ORAL_TABLET | Freq: Once | ORAL | Status: AC
Start: 2023-05-26 — End: 2023-05-26
  Administered 2023-05-26: 10 mg via ORAL
  Filled 2023-05-26: qty 1

## 2023-05-26 MED ORDER — VINORELBINE TARTRATE CHEMO INJECTION 50 MG/5ML
25.0000 mg/m2 | Freq: Once | INTRAVENOUS | Status: AC
  Administered 2023-05-26: 60 mg via INTRAVENOUS
  Filled 2023-05-26: qty 5

## 2023-05-26 MED ORDER — SODIUM CHLORIDE 0.9 % IV SOLN: INTRAVENOUS | Status: DC

## 2023-05-26 MED ORDER — ONDANSETRON HCL 8 MG PO TABS: 8.0000 mg | ORAL_TABLET | Freq: Three times a day (TID) | ORAL | 3 refills | Status: DC | PRN

## 2023-05-26 MED ORDER — PROCHLORPERAZINE MALEATE 10 MG PO TABS: 10.0000 mg | ORAL_TABLET | Freq: Four times a day (QID) | ORAL | 3 refills | Status: DC | PRN

## 2023-05-26 MED ORDER — HEPARIN SOD (PORK) LOCK FLUSH 100 UNIT/ML IV SOLN: 500.0000 [IU] | Freq: Once | INTRAVENOUS | Status: DC | PRN

## 2023-05-26 MED ORDER — SODIUM CHLORIDE 0.9 % IV SOLN
1000.0000 mg/m2 | Freq: Once | INTRAVENOUS | Status: AC
  Administered 2023-05-26: 2394 mg via INTRAVENOUS
  Filled 2023-05-26: qty 52.97

## 2023-05-26 NOTE — Addendum Note (Signed)
 Addended by: Arlan Organ R on: 05/26/2023 10:37 AM   Modules accepted: Orders

## 2023-05-26 NOTE — Progress Notes (Signed)
 [facility] Hematology and Oncology Follow Up Visit  Ivar Domangue [id number] 1960-03-27 63 y.o. 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 3 months --next dose in 06/2023  XRT -right adrenal gland.  2 finish on 04/04/2023 Gemzar/Navelbine -start cycle 1 on 05/26/2023    Interim History:  Mr. Kraemer is here today for follow-up.  The big news is that he is now a new grandfather.  He had a grandson that was born recently.  I am so happy for him.  He is doing quite well with the pain is overall on his right side.  He I think Duragesic patch.  This seems to be helping.  He also is on some oxycodone..  Will start treatment on him today.  We will go with gemcitabine/Navelbine and see if this may help.  His appetite is okay.  His weight is holding steady.  He is doing well with respect to his heart.  His atrial fibrillation seems to be under pretty decent control right now.  He has had no change in bowel or bladder habits.  He has had no headache.  There is been no leg swelling.  Overall, I would say that his performance status is probably ECOG 1.    Medications:  Allergies as of 05/26/2023       Reactions   Ventolin [albuterol] Other (See Comments)   Patient has had episode of atrial fib following administration of albuterol (tolerates Xopenex well)   Lipitor [atorvastatin] Other (See Comments)   Arthralgias         Medication List        Accurate as of May 26, 2023 10:09 AM. If you have any questions, ask your nurse or doctor.          acetaminophen 500 MG tablet Commonly known as: TYLENOL Take 500 mg by mouth daily as needed for moderate pain (pain  score 4-6), fever or headache.   amiodarone 200 MG tablet Commonly known as: PACERONE START by taking 2 tablets (400mg ) daily for 2 weeks THEN reduce to 1 tablet (200mg ) daily What changed: how much to take   Eliquis 5 MG Tabs tablet Generic drug: apixaban TAKE 1 TABLET(5 MG) BY MOUTH TWICE DAILY   fentaNYL 25 MCG/HR Commonly known as: DURAGESIC Place 1 patch onto the skin every 3 (three) days.   HYDROmorphone 2 MG tablet Commonly known as: Dilaudid Take 1 tablet (2 mg total) by mouth every 4 (four) hours as needed for severe pain (pain score 7-10).   lidocaine 5 % Commonly known as: LIDODERM Place 1 patch onto the skin daily. Remove & Discard patch within 12 hours or as directed by MD   methimazole 5 MG tablet Commonly known as: TAPAZOLE Take 5 mg by mouth daily.   metoprolol succinate 25 MG 24 hr tablet Commonly known as: TOPROL-XL TAKE 1 TABLET(25 MG) BY MOUTH DAILY   oxyCODONE-acetaminophen 5-325 MG tablet Commonly known as: PERCOCET/ROXICET Take 2 tablets by mouth every 6 (six) hours as needed.   PARoxetine 10 MG tablet Commonly known as: PAXIL Take 1 tablet (10 mg total) by mouth at bedtime.  predniSONE 5 MG tablet Commonly known as: DELTASONE Take 1 tablet (5 mg total) by mouth daily with breakfast.   Repatha SureClick 140 MG/ML Soaj Generic drug: Evolocumab ADMINISTER 1 ML UNDER THE SKIN EVERY 14 DAYS   Trelegy Ellipta 100-62.5-25 MCG/ACT Aepb Generic drug: Fluticasone-Umeclidin-Vilant INHALE 1 PUFF INTO THE LUNGS DAILY        Allergies:  Allergies  Allergen Reactions   Ventolin [Albuterol] Other (See Comments)    Patient has had episode of atrial fib following administration of albuterol (tolerates Xopenex well)   Lipitor [Atorvastatin] Other (See Comments)    Arthralgias     Past Medical History, Surgical history, Social history, and Family History were reviewed and updated.  Review of Systems: Review of Systems  Constitutional:  Positive  for malaise/fatigue.  HENT: Negative.    Eyes: Negative.   Respiratory:  Positive for cough and shortness of breath.   Cardiovascular:  Positive for chest pain.  Gastrointestinal: Negative.   Genitourinary: Negative.   Musculoskeletal:  Positive for joint pain.  Skin: Negative.   Neurological:  Positive for focal weakness.  Endo/Heme/Allergies: Negative.   Psychiatric/Behavioral: Negative.       Physical Exam:  Temperature is 97.8.  Pulse 68.  Blood pressure 113/70.  Weight is 247 pounds    Wt Readings from Last 3 Encounters:  05/09/23 249 lb (112.9 kg)  04/29/23 243 lb (110.2 kg)  04/29/23 245 lb (111.1 kg)    Physical Exam Vitals reviewed.  HENT:     Head: Normocephalic and atraumatic.  Eyes:     Pupils: Pupils are equal, round, and reactive to light.  Cardiovascular:     Rate and Rhythm: Normal rate and regular rhythm.     Heart sounds: Normal heart sounds.  Pulmonary:     Effort: Pulmonary effort is normal.     Breath sounds: Normal breath sounds.  Abdominal:     General: Bowel sounds are normal.     Palpations: Abdomen is soft.  Musculoskeletal:        General: No tenderness or deformity. Normal range of motion.     Cervical back: Normal range of motion.  Lymphadenopathy:     Cervical: No cervical adenopathy.  Skin:    General: Skin is warm and dry.     Findings: No erythema or rash.  Neurological:     Mental Status: He is alert and oriented to person, place, and time.  Psychiatric:        Behavior: Behavior normal.        Thought Content: Thought content normal.        Judgment: Judgment normal.     Lab Results  Component Value Date   WBC 3.2 (L) 05/26/2023   HGB 13.3 05/26/2023   HCT 40.0 05/26/2023   MCV 88.3 05/26/2023   PLT 178 05/26/2023   Lab Results  Component Value Date   FERRITIN 446 (H) 08/23/2021   IRON 189 (H) 08/26/2021   TIBC 213 (L) 08/26/2021   UIBC 24 08/26/2021   IRONPCTSAT 89 (H) 08/26/2021   Lab Results  Component  Value Date   RETICCTPCT 1.9 08/23/2021   RBC 4.53 05/26/2023   No results found for: "KPAFRELGTCHN", "LAMBDASER", "KAPLAMBRATIO" No results found for: "IGGSERUM", "IGA", "IGMSERUM" No results found for: "TOTALPROTELP", "ALBUMINELP", "A1GS", "A2GS", "BETS", "BETA2SER", "GAMS", "MSPIKE", "SPEI"   Chemistry      Component Value Date/Time   NA 138 05/26/2023 0901   NA 139 04/12/2023 0937   K 3.7 05/26/2023 0901  CL 103 05/26/2023 0901   CO2 28 05/26/2023 0901   BUN 12 05/26/2023 0901   BUN 12 04/12/2023 0937   CREATININE 1.18 05/26/2023 0901      Component Value Date/Time   CALCIUM 8.8 (L) 05/26/2023 0901   ALKPHOS 77 05/26/2023 0901   AST 17 05/26/2023 0901   ALT 16 05/26/2023 0901   BILITOT 0.8 05/26/2023 0901       Impression and Plan: Mr. Campton is a very pleasant 64 yo gentleman with metastatic squamous cell carcinoma of the right lung.  The tumor had high PD-L1 score.  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  We will see if we try 1 more course of therapy.  Again, we can try chemotherapy and hopefully we can get a response.  Hopefully this may help with his pain.   I am happy that he is now grandfather.  I know this was a big event for him.  I am happy that he is able to experience this.  Hopefully, we will find that he is going to respond to treatment.  I will plan to get him back in 3 weeks when he starts his second cycle of treatment.  I probably would do 3 cycles of treatment and then repeat the PET scan.   Josph Macho, MD 4/10/202510:09 AM

## 2023-05-26 NOTE — Patient Instructions (Addendum)
 [facility] - A DEPT OF [facility]  Discharge Instructions: Thank you for choosing [facility] to provide your oncology and hematology care.   If you have a lab appointment with the Cancer Center, please go directly to the Cancer Center and check in at the registration area.  Wear comfortable clothing and clothing appropriate for easy access to any Portacath or PICC line.   We strive to give you quality time with your provider. You may need to reschedule your appointment if you arrive late (15 or more minutes).  Arriving late affects you and other patients whose appointments are after yours.  Also, if you miss three or more appointments without notifying the office, you may be dismissed from the clinic at the provider's discretion.      For prescription refill requests, have your pharmacy contact our office and allow 72 hours for refills to be completed.    Today you received the following chemotherapy and/or immunotherapy agents Navelbine/Gemzar      To help prevent nausea and vomiting after your treatment, we encourage you to take your nausea medication as directed.  BELOW ARE SYMPTOMS THAT SHOULD BE REPORTED IMMEDIATELY: *FEVER GREATER THAN 100.4 F (38 C) OR HIGHER *CHILLS OR SWEATING *NAUSEA AND VOMITING THAT IS NOT CONTROLLED WITH YOUR NAUSEA MEDICATION *UNUSUAL SHORTNESS OF BREATH *UNUSUAL BRUISING OR BLEEDING *URINARY PROBLEMS (pain or burning when urinating, or frequent urination) *BOWEL PROBLEMS (unusual diarrhea, constipation, pain near the anus) TENDERNESS IN MOUTH AND THROAT WITH OR WITHOUT PRESENCE OF ULCERS (sore throat, sores in mouth, or a toothache) UNUSUAL RASH, SWELLING OR PAIN  UNUSUAL VAGINAL DISCHARGE OR ITCHING   Items with * indicate a potential emergency and should be followed up as soon as possible or go to the Emergency Department if any problems should occur.  Please show the CHEMOTHERAPY ALERT CARD or  IMMUNOTHERAPY ALERT CARD at check-in to the Emergency Department and triage nurse. Should you have questions after your visit or need to cancel or reschedule your appointment, please contact [facility] - A DEPT OF [facility]  [phone] and follow the prompts.  Office hours are 8:00 a.m. to 4:30 p.m. Monday - Friday. Please note that voicemails left after 4:00 p.m. may not be returned until the following business day.  We are closed weekends and major holidays. You have access to a nurse at all times for urgent questions. Please call the main number to the clinic [phone] and follow the prompts.  For any non-urgent questions, you may also contact your provider using MyChart. We now offer e-Visits for anyone 48 and older to request care online for non-urgent symptoms. For details visit [email or web address].   Also download the MyChart app! Go to the app store, search "MyChart", open the app, select [place], and log in with your MyChart username and password.  You can take another prochloraperazine at 4:30 pm today if needed for nausea. Then as directed.   Or you can take a ondansetron if needed for nausea anytime today and then as directed.

## 2023-05-28 ENCOUNTER — Other Ambulatory Visit: Payer: Self-pay

## 2023-05-30 ENCOUNTER — Other Ambulatory Visit: Payer: Self-pay | Admitting: Internal Medicine

## 2023-05-30 ENCOUNTER — Telehealth: Payer: Self-pay

## 2023-05-30 DIAGNOSIS — E0590 Thyrotoxicosis, unspecified without thyrotoxic crisis or storm: Secondary | ICD-10-CM

## 2023-05-30 NOTE — Telephone Encounter (Signed)
 Received phone call from patient stating that he has been having fatigue and headache since receiving chemotherapy last week. Pt stated that his headache started the day after chemotherapy and pt had right arm pain where IV was. Pt states he put a lidocaine patch on his right arm and the pain went away. Pt states he has had some nausea but no vomiting. Pt states he felt like he had "brain fog" Pt states that today he has only taken tylenol and the brain fog has gotten better with stopping his percocet.  Pt encouraged to use his anti nausea medications and to hydrate.  Pt educated on post chemotherapy fatigue. Pt educated that if his headache doesn't improve in the next day to call the clinic back. Pt encouraged to take a flu/covid test to determine cause of headache. Pt denies any sick contacts. Pt states he is drinking over 64 ounces of water daily. Pt verbalized understanding and had no further questions.

## 2023-06-02 ENCOUNTER — Telehealth: Payer: Self-pay | Admitting: Adult Health

## 2023-06-02 ENCOUNTER — Other Ambulatory Visit: Payer: Self-pay | Admitting: *Deleted

## 2023-06-02 ENCOUNTER — Inpatient Hospital Stay

## 2023-06-02 ENCOUNTER — Telehealth: Payer: Self-pay | Admitting: *Deleted

## 2023-06-02 ENCOUNTER — Encounter: Payer: Self-pay | Admitting: Hematology & Oncology

## 2023-06-02 VITALS — BP 117/60 | HR 70 | Temp 98.2°F | Resp 19

## 2023-06-02 DIAGNOSIS — C3410 Malignant neoplasm of upper lobe, unspecified bronchus or lung: Secondary | ICD-10-CM

## 2023-06-02 DIAGNOSIS — R110 Nausea: Secondary | ICD-10-CM

## 2023-06-02 DIAGNOSIS — D702 Other drug-induced agranulocytosis: Secondary | ICD-10-CM

## 2023-06-02 DIAGNOSIS — E1169 Type 2 diabetes mellitus with other specified complication: Secondary | ICD-10-CM

## 2023-06-02 DIAGNOSIS — C3400 Malignant neoplasm of unspecified main bronchus: Secondary | ICD-10-CM

## 2023-06-02 DIAGNOSIS — I480 Paroxysmal atrial fibrillation: Secondary | ICD-10-CM

## 2023-06-02 DIAGNOSIS — Z5111 Encounter for antineoplastic chemotherapy: Secondary | ICD-10-CM | POA: Diagnosis not present

## 2023-06-02 DIAGNOSIS — S22080A Wedge compression fracture of T11-T12 vertebra, initial encounter for closed fracture: Secondary | ICD-10-CM

## 2023-06-02 DIAGNOSIS — C3491 Malignant neoplasm of unspecified part of right bronchus or lung: Secondary | ICD-10-CM

## 2023-06-02 DIAGNOSIS — D649 Anemia, unspecified: Secondary | ICD-10-CM

## 2023-06-02 DIAGNOSIS — C3401 Malignant neoplasm of right main bronchus: Secondary | ICD-10-CM

## 2023-06-02 DIAGNOSIS — C3411 Malignant neoplasm of upper lobe, right bronchus or lung: Secondary | ICD-10-CM

## 2023-06-02 LAB — CBC WITH DIFFERENTIAL (CANCER CENTER ONLY)
Abs Immature Granulocytes: 0.02 10*3/uL (ref 0.00–0.07)
Basophils Absolute: 0 10*3/uL (ref 0.0–0.1)
Basophils Relative: 1 %
Eosinophils Absolute: 0.2 10*3/uL (ref 0.0–0.5)
Eosinophils Relative: 26 %
HCT: 36.7 % — ABNORMAL LOW (ref 39.0–52.0)
Hemoglobin: 12.5 g/dL — ABNORMAL LOW (ref 13.0–17.0)
Immature Granulocytes: 2 %
Lymphocytes Relative: 47 %
Lymphs Abs: 0.4 10*3/uL — ABNORMAL LOW (ref 0.7–4.0)
MCH: 29 pg (ref 26.0–34.0)
MCHC: 34.1 g/dL (ref 30.0–36.0)
MCV: 85.2 fL (ref 80.0–100.0)
Monocytes Absolute: 0.1 10*3/uL (ref 0.1–1.0)
Monocytes Relative: 11 %
Neutro Abs: 0.1 10*3/uL — CL (ref 1.7–7.7)
Neutrophils Relative %: 13 %
Platelet Count: 104 10*3/uL — ABNORMAL LOW (ref 150–400)
RBC: 4.31 MIL/uL (ref 4.22–5.81)
RDW: 14.9 % (ref 11.5–15.5)
Smear Review: NORMAL
WBC Count: 0.9 10*3/uL — CL (ref 4.0–10.5)
WBC Morphology: ABNORMAL
nRBC: 0 % (ref 0.0–0.2)

## 2023-06-02 LAB — CMP (CANCER CENTER ONLY)
ALT: 127 U/L — ABNORMAL HIGH (ref 0–44)
AST: 181 U/L (ref 15–41)
Albumin: 3.6 g/dL (ref 3.5–5.0)
Alkaline Phosphatase: 102 U/L (ref 38–126)
Anion gap: 9 (ref 5–15)
BUN: 11 mg/dL (ref 8–23)
CO2: 27 mmol/L (ref 22–32)
Calcium: 9.4 mg/dL (ref 8.9–10.3)
Chloride: 101 mmol/L (ref 98–111)
Creatinine: 1.15 mg/dL (ref 0.61–1.24)
GFR, Estimated: >60 mL/min (ref >60)
Glucose, Bld: 116 mg/dL — ABNORMAL HIGH (ref 70–99)
Potassium: 4.4 mmol/L (ref 3.5–5.1)
Sodium: 137 mmol/L (ref 135–145)
Total Bilirubin: 1.2 mg/dL (ref 0.0–1.2)
Total Protein: 6.8 g/dL (ref 6.5–8.1)

## 2023-06-02 MED ORDER — SODIUM CHLORIDE 0.9 % IV SOLN: 8.0000 mg | Freq: Once | INTRAVENOUS | Status: DC

## 2023-06-02 MED ORDER — ONDANSETRON HCL 4 MG/2ML IJ SOLN
8.0000 mg | Freq: Once | INTRAMUSCULAR | Status: AC
  Administered 2023-06-02: 8 mg via INTRAVENOUS
  Filled 2023-06-02: qty 4

## 2023-06-02 MED ORDER — ONDANSETRON HCL 4 MG/2ML IJ SOLN
8.0000 mg | Freq: Once | INTRAMUSCULAR | Status: AC
  Administered 2023-06-02: 8 mg via INTRAVENOUS

## 2023-06-02 MED ORDER — CIPROFLOXACIN HCL 500 MG PO TABS: 500.0000 mg | ORAL_TABLET | Freq: Every day | ORAL | 0 refills | Status: DC

## 2023-06-02 MED ORDER — SODIUM CHLORIDE 0.9 % IV SOLN: INTRAVENOUS | Status: DC

## 2023-06-02 MED ORDER — BENZONATATE 200 MG PO CAPS
200.0000 mg | ORAL_CAPSULE | Freq: Three times a day (TID) | ORAL | 1 refills | Status: DC | PRN
Start: 2023-06-02 — End: 2023-07-28

## 2023-06-02 MED ORDER — ONDANSETRON HCL 4 MG/2ML IJ SOLN: 8.0000 mg | Freq: Once | INTRAMUSCULAR | Status: DC

## 2023-06-02 MED ORDER — PEGFILGRASTIM-CBQV 6 MG/0.6ML ~~LOC~~ SOSY
6.0000 mg | PREFILLED_SYRINGE | Freq: Once | SUBCUTANEOUS | Status: AC
  Administered 2023-06-02: 6 mg via SUBCUTANEOUS
  Filled 2023-06-02: qty 0.6

## 2023-06-02 MED ORDER — HYDROCODONE BIT-HOMATROP MBR 5-1.5 MG/5ML PO SOLN: 5.0000 mL | Freq: Every evening | ORAL | 0 refills | Status: DC | PRN

## 2023-06-02 NOTE — Progress Notes (Signed)
 Patient is neutropenic, no treatment today.. Patient will receive Udenyca, orders entered per Dr. Birt Bulla instructions.

## 2023-06-02 NOTE — Progress Notes (Signed)
 No treatment given today per Dr. Maria Shiner due to low counts.  John [name]. Received Udenyca, IV fluids and Zofran.  He was instructed to pick up prescription at his [facility] for Cipro.  Will RTC in 10 days for labs.

## 2023-06-02 NOTE — Telephone Encounter (Signed)
 Stage IV Lung cancer undergoing active therapy.  Requests refill of cough syrup - Hydromet  PMP reviewed.  Will send in refill , patient on multiple pain meds-  Advised to use with caution and not to combine with Oxycodone.  Try delsym 2 tsp Twice daily  for cough  Add Tessalon Three times a day  As needed  cough  Use only for severe cough .  Please contact office for sooner follow up if symptoms do not improve or worsen or seek emergency care

## 2023-06-02 NOTE — Patient Instructions (Addendum)
 Dehydration, Adult Dehydration is a condition in which there is not enough water or other fluids in the body. This happens when a person loses more fluids than they take in. Important organs, such as the kidneys, brain, and heart, cannot function without a proper amount of fluids. Any loss of fluids from the body can lead to dehydration. Dehydration can be mild, moderate, or severe. It should be treated right away to prevent it from becoming severe. What are the causes? Dehydration may be caused by: Health conditions, such as diarrhea, vomiting, fever, infection, or sweating or urinating a lot. Not drinking enough fluids. Certain medicines, such as medicines that remove excess fluid from the body (diuretics). Lack of safe drinking water. Not being able to get enough water and food. What increases the risk? The following factors may make you more likely to develop this condition: Having a long-term (chronic) illness that has not been treated properly, such as diabetes, heart disease, or kidney disease. Being 62 years of age or older. Having a disability. Living in a place that is high in altitude, where thinner, drier air causes more fluid loss. Doing exercises that put stress on your body for a long time (endurance sports). Being active in a hot climate. What are the signs or symptoms? Symptoms of dehydration depend on how severe it is. Mild or moderate dehydration Thirst. Dry lips or dry mouth. Dizziness or light-headedness. Muscle cramps. Dark urine. Urine may be the color of tea. Less urine or tears produced than usual. Headache. Severe dehydration Changes in skin. Your skin may be cold and clammy, blotchy, or pale. Your skin also may not return to normal after being lightly pinched and released. Little or no tears, urine, or sweat. Rapid breathing and low blood pressure. Your pulse may be weak or may be faster than 100 beats per minute when you are sitting still. Other changes,  such as: Feeling very thirsty. Sunken eyes. Cold hands and feet. Confusion. Being very tired (lethargic) or having trouble waking from sleep. Short-term weight loss. Loss of consciousness. How is this diagnosed? This condition is diagnosed based on your symptoms and a physical exam. You may have blood and urine tests to help confirm the diagnosis. How is this treated? Treatment for this condition depends on how severe it is. Treatment should be started right away. Do not wait until dehydration becomes severe. Severe dehydration is an emergency and needs to be treated in a hospital. Mild or moderate dehydration can be treated at home. You may be asked to: Drink more fluids. Drink an oral rehydration solution (ORS). This drink restores fluids, salts, and minerals in the blood (electrolytes). Stop any activities that caused dehydration, such as exercise. Cool off with cool compresses, cool mist, or cool fluids, if heat or too much sweat caused your condition. Take medicine to treat fever, if fever caused your condition. Take medicine to treat nausea and diarrhea, if vomiting or diarrhea caused your condition. Severe dehydration can be treated: With IV fluids. By correcting abnormal levels of electrolytes in your body. By treating the underlying cause of dehydration. Follow these instructions at home: Oral rehydration solution If told by your health care provider, drink an ORS: Make an ORS by following instructions on the package. Start by drinking small amounts, about  cup (120 mL) every 5-10 minutes. Slowly increase how much you drink until you have taken the amount recommended by your health care provider.  Eating and drinking  Drink enough clear fluid to keep  your urine pale yellow. If you were told to drink an ORS, finish the ORS first and then start slowly drinking other clear fluids. Drink fluids such as: Water. Do not drink only water. Doing that can lead to hyponatremia, which  is having too little salt (sodium) in the body. Water from ice chips you suck on. Diluted fruit juice. This is fruit juice that you have added water to. Low-calorie sports drinks. Eat foods that contain a healthy balance of electrolytes, such as bananas, oranges, potatoes, tomatoes, and spinach. Do not drink alcohol. Avoid the following: Drinks that contain a lot of sugar. These include high-calorie sports drinks, fruit juice that is not diluted, and soda. Caffeine. Foods that are greasy or contain a lot of fat or sugar. General instructions Take over-the-counter and prescription medicines only as told by your health care provider. Do not take sodium tablets. Doing that can lead to having too much sodium in the body (hypernatremia). Return to your normal activities as told by your health care provider. Ask your health care provider what activities are safe for you. Keep all follow-up visits. Your health care provider may need to check your progress and suggest new ways to treat your condition. Contact a health care provider if: You have muscle cramps, pain, or discomfort, such as: Pain in your abdomen and the pain gets worse or stays in one area. Stiff neck. You have a rash. You are more irritable than usual. You are sleepier or have a harder time waking. You feel weak or dizzy. You feel very thirsty. Get help right away if: You have symptoms of severe dehydration. You vomit every time you eat or drink. Your vomiting gets worse, does not go away, or includes blood or green matter (bile). You are getting treatment but symptoms are getting worse. You have a fever. You have a severe headache. You have: Diarrhea that gets worse or does not go away. Blood in your stool. This may cause stool to look black and tarry. Not urinating, or urinating only a small amount of very dark urine, within 6-8 hours. You have trouble breathing. These symptoms may be an emergency. Get help right  away. Do not wait to see if the symptoms will go away. Do not drive yourself to the hospital. Call 911. This information is not intended to replace advice given to you by your health care provider. Make sure you discuss any questions you have with your health care provider. Document Revised: 08/31/2021 Document Reviewed: 08/31/2021 Elsevier Patient Education  2024 Elsevier Inc.Pegfilgrastim Injection What is this medication? PEGFILGRASTIM (PEG fil gra stim) lowers the risk of infection in people who are receiving chemotherapy. It works by Systems analyst make more white blood cells, which protects your body from infection. It may also be used to help people who have been exposed to high doses of radiation. This medicine may be used for other purposes; ask your health care provider or pharmacist if you have questions. COMMON BRAND NAME(S): Cherly Hensen, Neulasta, Nyvepria, Stimufend, UDENYCA, UDENYCA ONBODY, Ziextenzo What should I tell my care team before I take this medication? They need to know if you have any of these conditions: Kidney disease Latex allergy Ongoing radiation therapy Sickle cell disease Skin reactions to acrylic adhesives (On-Body Injector only) An unusual or allergic reaction to pegfilgrastim, filgrastim, other medications, foods, dyes, or preservatives Pregnant or trying to get pregnant Breast-feeding How should I use this medication? This medication is for injection under the skin. If you  get this medication at home, you will be taught how to prepare and give the pre-filled syringe or how to use the On-body Injector. Refer to the patient Instructions for Use for detailed instructions. Use exactly as directed. Tell your care team immediately if you suspect that the On-body Injector may not have performed as intended or if you suspect the use of the On-body Injector resulted in a missed or partial dose. It is important that you put your used needles and syringes in  a special sharps container. Do not put them in a trash can. If you do not have a sharps container, call your pharmacist or care team to get one. Talk to your care team about the use of this medication in children. While this medication may be prescribed for selected conditions, precautions do apply. Overdosage: If you think you have taken too much of this medicine contact a poison control center or emergency room at once. NOTE: This medicine is only for you. Do not share this medicine with others. What if I miss a dose? It is important not to miss your dose. Call your care team if you miss your dose. If you miss a dose due to an On-body Injector failure or leakage, a new dose should be administered as soon as possible using a single prefilled syringe for manual use. What may interact with this medication? Interactions have not been studied. This list may not describe all possible interactions. Give your health care provider a list of all the medicines, herbs, non-prescription drugs, or dietary supplements you use. Also tell them if you smoke, drink alcohol, or use illegal drugs. Some items may interact with your medicine. What should I watch for while using this medication? Your condition will be monitored carefully while you are receiving this medication. You may need blood work done while you are taking this medication. Talk to your care team about your risk of cancer. You may be more at risk for certain types of cancer if you take this medication. If you are going to need a MRI, CT scan, or other procedure, tell your care team that you are using this medication (On-Body Injector only). What side effects may I notice from receiving this medication? Side effects that you should report to your care team as soon as possible: Allergic reactions--skin rash, itching, hives, swelling of the face, lips, tongue, or throat Capillary leak syndrome--stomach or muscle pain, unusual weakness or fatigue, feeling  faint or lightheaded, decrease in the amount of urine, swelling of the ankles, hands, or feet, trouble breathing High white blood cell level--fever, fatigue, trouble breathing, night sweats, change in vision, weight loss Inflammation of the aorta--fever, fatigue, back, chest, or stomach pain, severe headache Kidney injury (glomerulonephritis)--decrease in the amount of urine, red or dark brown urine, foamy or bubbly urine, swelling of the ankles, hands, or feet Shortness of breath or trouble breathing Spleen injury--pain in upper left stomach or shoulder Unusual bruising or bleeding Side effects that usually do not require medical attention (report to your care team if they continue or are bothersome): Bone pain Pain in the hands or feet This list may not describe all possible side effects. Call your doctor for medical advice about side effects. You may report side effects to FDA at 1-800-FDA-1088. Where should I keep my medication? Keep out of the reach of children. If you are using this medication at home, you will be instructed on how to store it. Throw away any unused medication after the  expiration date on the label. NOTE: This sheet is a summary. It may not cover all possible information. If you have questions about this medicine, talk to your doctor, pharmacist, or health care provider.  2024 Elsevier/Gold Standard (2021-01-02 00:00:00)Pick up Rx for Cipro from pharmacy and return in approximately 10 days for count recheck.

## 2023-06-02 NOTE — Telephone Encounter (Signed)
 Dr. Maria Shiner notified of WBC-0.9, ANC-0.1 and AST-181.  Orders received from Dr. Maria Shiner to hold treatment today, to give pt Neulasta today and to return to office in 10 days to recheck labs and possible IVF's. Cipro prescription sent in for pt also per order of Dr. Maria Shiner.  Pt aware and has no questions at this time.

## 2023-06-03 ENCOUNTER — Telehealth: Payer: Self-pay

## 2023-06-03 NOTE — Telephone Encounter (Signed)
 Pharmacy Patient Advocate Encounter  Received notification from [facility] that Prior Authorization for Hydromet 5-1.5MG /5ML solution  has been APPROVED from 06/03/2023 to 09/01/2023   PA #/Case ID/Reference #: Henri Loft

## 2023-06-06 ENCOUNTER — Emergency Department (HOSPITAL_COMMUNITY)

## 2023-06-06 ENCOUNTER — Other Ambulatory Visit: Payer: Self-pay

## 2023-06-06 ENCOUNTER — Inpatient Hospital Stay (HOSPITAL_COMMUNITY)
Admission: EM | Admit: 2023-06-06 | Discharge: 2023-06-09 | DRG: 417 | Disposition: A | Discharge status: Home / Self Care | Attending: Family Medicine | Admitting: Family Medicine

## 2023-06-06 ENCOUNTER — Encounter (HOSPITAL_COMMUNITY): Payer: Self-pay | Admitting: *Deleted

## 2023-06-06 DIAGNOSIS — M545 Low back pain, unspecified: Secondary | ICD-10-CM | POA: Diagnosis not present

## 2023-06-06 DIAGNOSIS — Z888 Allergy status to other drugs, medicaments and biological substances status: Secondary | ICD-10-CM

## 2023-06-06 DIAGNOSIS — Z6834 Body mass index (BMI) 34.0-34.9, adult: Secondary | ICD-10-CM | POA: Diagnosis not present

## 2023-06-06 DIAGNOSIS — K745 Biliary cirrhosis, unspecified: Secondary | ICD-10-CM | POA: Diagnosis not present

## 2023-06-06 DIAGNOSIS — K8062 Calculus of gallbladder and bile duct with acute cholecystitis without obstruction: Secondary | ICD-10-CM | POA: Diagnosis present

## 2023-06-06 DIAGNOSIS — I714 Abdominal aortic aneurysm, without rupture, unspecified: Secondary | ICD-10-CM | POA: Diagnosis present

## 2023-06-06 DIAGNOSIS — Z7952 Long term (current) use of systemic steroids: Secondary | ICD-10-CM | POA: Diagnosis not present

## 2023-06-06 DIAGNOSIS — J9611 Chronic respiratory failure with hypoxia: Secondary | ICD-10-CM | POA: Diagnosis present

## 2023-06-06 DIAGNOSIS — D702 Other drug-induced agranulocytosis: Secondary | ICD-10-CM | POA: Diagnosis present

## 2023-06-06 DIAGNOSIS — F32A Depression, unspecified: Secondary | ICD-10-CM | POA: Diagnosis present

## 2023-06-06 DIAGNOSIS — Z885 Allergy status to narcotic agent status: Secondary | ICD-10-CM

## 2023-06-06 DIAGNOSIS — J8410 Pulmonary fibrosis, unspecified: Secondary | ICD-10-CM | POA: Diagnosis present

## 2023-06-06 DIAGNOSIS — Z8616 Personal history of COVID-19: Secondary | ICD-10-CM | POA: Diagnosis not present

## 2023-06-06 DIAGNOSIS — Z87891 Personal history of nicotine dependence: Secondary | ICD-10-CM | POA: Diagnosis not present

## 2023-06-06 DIAGNOSIS — Z7901 Long term (current) use of anticoagulants: Secondary | ICD-10-CM | POA: Diagnosis not present

## 2023-06-06 DIAGNOSIS — J189 Pneumonia, unspecified organism: Secondary | ICD-10-CM | POA: Diagnosis present

## 2023-06-06 DIAGNOSIS — C7971 Secondary malignant neoplasm of right adrenal gland: Secondary | ICD-10-CM | POA: Diagnosis present

## 2023-06-06 DIAGNOSIS — K8050 Calculus of bile duct without cholangitis or cholecystitis without obstruction: Secondary | ICD-10-CM

## 2023-06-06 DIAGNOSIS — Z923 Personal history of irradiation: Secondary | ICD-10-CM

## 2023-06-06 DIAGNOSIS — Z8249 Family history of ischemic heart disease and other diseases of the circulatory system: Secondary | ICD-10-CM | POA: Diagnosis not present

## 2023-06-06 DIAGNOSIS — D61818 Other pancytopenia: Secondary | ICD-10-CM | POA: Diagnosis present

## 2023-06-06 DIAGNOSIS — K219 Gastro-esophageal reflux disease without esophagitis: Secondary | ICD-10-CM | POA: Diagnosis present

## 2023-06-06 DIAGNOSIS — C3491 Malignant neoplasm of unspecified part of right bronchus or lung: Secondary | ICD-10-CM | POA: Diagnosis present

## 2023-06-06 DIAGNOSIS — M549 Dorsalgia, unspecified: Secondary | ICD-10-CM | POA: Diagnosis present

## 2023-06-06 DIAGNOSIS — C3490 Malignant neoplasm of unspecified part of unspecified bronchus or lung: Secondary | ICD-10-CM | POA: Diagnosis not present

## 2023-06-06 DIAGNOSIS — K8067 Calculus of gallbladder and bile duct with acute and chronic cholecystitis with obstruction: Secondary | ICD-10-CM

## 2023-06-06 DIAGNOSIS — E871 Hypo-osmolality and hyponatremia: Secondary | ICD-10-CM | POA: Diagnosis present

## 2023-06-06 DIAGNOSIS — D696 Thrombocytopenia, unspecified: Secondary | ICD-10-CM | POA: Diagnosis not present

## 2023-06-06 DIAGNOSIS — D649 Anemia, unspecified: Secondary | ICD-10-CM | POA: Diagnosis not present

## 2023-06-06 DIAGNOSIS — E66811 Obesity, class 1: Secondary | ICD-10-CM | POA: Diagnosis present

## 2023-06-06 DIAGNOSIS — K8080 Other cholelithiasis without obstruction: Secondary | ICD-10-CM | POA: Diagnosis not present

## 2023-06-06 DIAGNOSIS — K8020 Calculus of gallbladder without cholecystitis without obstruction: Secondary | ICD-10-CM | POA: Diagnosis not present

## 2023-06-06 DIAGNOSIS — I700 Atherosclerosis of aorta: Secondary | ICD-10-CM | POA: Diagnosis present

## 2023-06-06 DIAGNOSIS — K760 Fatty (change of) liver, not elsewhere classified: Secondary | ICD-10-CM | POA: Diagnosis present

## 2023-06-06 DIAGNOSIS — K810 Acute cholecystitis: Secondary | ICD-10-CM | POA: Diagnosis not present

## 2023-06-06 DIAGNOSIS — J9621 Acute and chronic respiratory failure with hypoxia: Secondary | ICD-10-CM

## 2023-06-06 DIAGNOSIS — I4891 Unspecified atrial fibrillation: Secondary | ICD-10-CM | POA: Diagnosis not present

## 2023-06-06 DIAGNOSIS — Z9849 Cataract extraction status, unspecified eye: Secondary | ICD-10-CM

## 2023-06-06 DIAGNOSIS — J440 Chronic obstructive pulmonary disease with acute lower respiratory infection: Secondary | ICD-10-CM | POA: Diagnosis present

## 2023-06-06 DIAGNOSIS — I480 Paroxysmal atrial fibrillation: Secondary | ICD-10-CM | POA: Diagnosis present

## 2023-06-06 DIAGNOSIS — Z79899 Other long term (current) drug therapy: Secondary | ICD-10-CM

## 2023-06-06 DIAGNOSIS — G8929 Other chronic pain: Secondary | ICD-10-CM | POA: Diagnosis present

## 2023-06-06 DIAGNOSIS — Z7951 Long term (current) use of inhaled steroids: Secondary | ICD-10-CM

## 2023-06-06 DIAGNOSIS — F419 Anxiety disorder, unspecified: Secondary | ICD-10-CM | POA: Diagnosis present

## 2023-06-06 DIAGNOSIS — Z79891 Long term (current) use of opiate analgesic: Secondary | ICD-10-CM

## 2023-06-06 LAB — MAGNESIUM: Magnesium: 1.8 mg/dL (ref 1.7–2.4)

## 2023-06-06 LAB — I-STAT CHEM 8, ED
BUN: 12 mg/dL (ref 8–23)
Calcium, Ion: 1.09 mmol/L — ABNORMAL LOW (ref 1.15–1.40)
Chloride: 96 mmol/L — ABNORMAL LOW (ref 98–111)
Creatinine, Ser: 1.3 mg/dL — ABNORMAL HIGH (ref 0.61–1.24)
Glucose, Bld: 138 mg/dL — ABNORMAL HIGH (ref 70–99)
HCT: 31 % — ABNORMAL LOW (ref 39.0–52.0)
Hemoglobin: 10.5 g/dL — ABNORMAL LOW (ref 13.0–17.0)
Potassium: 3.5 mmol/L (ref 3.5–5.1)
Sodium: 130 mmol/L — ABNORMAL LOW (ref 135–145)
TCO2: 24 mmol/L (ref 22–32)

## 2023-06-06 LAB — HEPATIC FUNCTION PANEL
ALT: 87 U/L — ABNORMAL HIGH (ref 0–44)
AST: 61 U/L — ABNORMAL HIGH (ref 15–41)
Albumin: 3 g/dL — ABNORMAL LOW (ref 3.5–5.0)
Alkaline Phosphatase: 142 U/L — ABNORMAL HIGH (ref 38–126)
Bilirubin, Direct: 1.8 mg/dL — ABNORMAL HIGH (ref 0.0–0.2)
Indirect Bilirubin: 2.9 mg/dL — ABNORMAL HIGH (ref 0.3–0.9)
Total Bilirubin: 4.7 mg/dL — ABNORMAL HIGH (ref 0.0–1.2)
Total Protein: 6.4 g/dL — ABNORMAL LOW (ref 6.5–8.1)

## 2023-06-06 LAB — BASIC METABOLIC PANEL WITH GFR
Anion gap: 9 (ref 5–15)
BUN: 14 mg/dL (ref 8–23)
CO2: 23 mmol/L (ref 22–32)
Calcium: 8.2 mg/dL — ABNORMAL LOW (ref 8.9–10.3)
Chloride: 95 mmol/L — ABNORMAL LOW (ref 98–111)
Creatinine, Ser: 1.12 mg/dL (ref 0.61–1.24)
GFR, Estimated: >60 mL/min (ref >60)
Glucose, Bld: 156 mg/dL — ABNORMAL HIGH (ref 70–99)
Potassium: 3.4 mmol/L — ABNORMAL LOW (ref 3.5–5.1)
Sodium: 127 mmol/L — ABNORMAL LOW (ref 135–145)

## 2023-06-06 LAB — I-STAT CG4 LACTIC ACID, ED: Lactic Acid, Venous: 1.9 mmol/L (ref 0.5–1.9)

## 2023-06-06 LAB — URINALYSIS, ROUTINE W REFLEX MICROSCOPIC
Bilirubin Urine: NEGATIVE
Glucose, UA: NEGATIVE mg/dL
Hgb urine dipstick: NEGATIVE
Ketones, ur: NEGATIVE mg/dL
Leukocytes,Ua: NEGATIVE
Nitrite: NEGATIVE
Protein, ur: NEGATIVE mg/dL
Specific Gravity, Urine: 1.027 (ref 1.005–1.030)
pH: 6 (ref 5.0–8.0)

## 2023-06-06 LAB — CBC
HCT: 35.6 % — ABNORMAL LOW (ref 39.0–52.0)
Hemoglobin: 11.9 g/dL — ABNORMAL LOW (ref 13.0–17.0)
MCH: 29 pg (ref 26.0–34.0)
MCHC: 33.4 g/dL (ref 30.0–36.0)
MCV: 86.6 fL (ref 80.0–100.0)
Platelets: 157 10*3/uL (ref 150–400)
RBC: 4.11 MIL/uL — ABNORMAL LOW (ref 4.22–5.81)
RDW: 15.1 % (ref 11.5–15.5)
WBC: 2.6 10*3/uL — ABNORMAL LOW (ref 4.0–10.5)
nRBC: 1.9 % — ABNORMAL HIGH (ref 0.0–0.2)

## 2023-06-06 LAB — TROPONIN I (HIGH SENSITIVITY)
Troponin I (High Sensitivity): 24 ng/L — ABNORMAL HIGH (ref <18)
Troponin I (High Sensitivity): 21 ng/L — ABNORMAL HIGH (ref <18)

## 2023-06-06 LAB — PROTIME-INR
INR: 1.6 — ABNORMAL HIGH (ref 0.8–1.2)
Prothrombin Time: 19.6 s — ABNORMAL HIGH (ref 11.4–15.2)

## 2023-06-06 LAB — CBG MONITORING, ED: Glucose-Capillary: 132 mg/dL — ABNORMAL HIGH (ref 70–99)

## 2023-06-06 MED ORDER — LACTATED RINGERS IV BOLUS (SEPSIS)
1000.0000 mL | Freq: Once | INTRAVENOUS | Status: AC
  Administered 2023-06-06: 1000 mL via INTRAVENOUS

## 2023-06-06 MED ORDER — SODIUM CHLORIDE 0.9 % IV SOLN: INTRAVENOUS | Status: AC

## 2023-06-06 MED ORDER — IOHEXOL 350 MG/ML SOLN
100.0000 mL | Freq: Once | INTRAVENOUS | Status: AC | PRN
  Administered 2023-06-06: 100 mL via INTRAVENOUS

## 2023-06-06 MED ORDER — ONDANSETRON HCL 4 MG PO TABS: 4.0000 mg | ORAL_TABLET | Freq: Four times a day (QID) | ORAL | Status: DC | PRN

## 2023-06-06 MED ORDER — HYDROMORPHONE HCL 1 MG/ML IJ SOLN
1.0000 mg | Freq: Once | INTRAMUSCULAR | Status: AC
  Administered 2023-06-06: 1 mg via INTRAVENOUS
  Filled 2023-06-06: qty 1

## 2023-06-06 MED ORDER — POTASSIUM CHLORIDE CRYS ER 20 MEQ PO TBCR
40.0000 meq | EXTENDED_RELEASE_TABLET | Freq: Once | ORAL | Status: AC
  Administered 2023-06-06: 40 meq via ORAL
  Filled 2023-06-06: qty 2

## 2023-06-06 MED ORDER — APIXABAN 5 MG PO TABS
5.0000 mg | ORAL_TABLET | Freq: Two times a day (BID) | ORAL | Status: DC
  Administered 2023-06-06: 5 mg via ORAL
  Filled 2023-06-06 (×2): qty 1

## 2023-06-06 MED ORDER — AMIODARONE HCL 200 MG PO TABS
200.0000 mg | ORAL_TABLET | Freq: Every day | ORAL | Status: DC
  Administered 2023-06-07 – 2023-06-09 (×3): 200 mg via ORAL
  Filled 2023-06-06 (×3): qty 1

## 2023-06-06 MED ORDER — ACETAMINOPHEN 500 MG PO TABS: 500.0000 mg | ORAL_TABLET | Freq: Every day | ORAL | Status: DC | PRN

## 2023-06-06 MED ORDER — BISACODYL 5 MG PO TBEC
5.0000 mg | DELAYED_RELEASE_TABLET | Freq: Every day | ORAL | Status: DC | PRN
  Administered 2023-06-07: 5 mg via ORAL
  Filled 2023-06-06: qty 1

## 2023-06-06 MED ORDER — METOPROLOL SUCCINATE ER 25 MG PO TB24
25.0000 mg | ORAL_TABLET | Freq: Every day | ORAL | Status: DC
  Administered 2023-06-09: 25 mg via ORAL
  Filled 2023-06-06 (×3): qty 1

## 2023-06-06 MED ORDER — FENTANYL 25 MCG/HR TD PT72
1.0000 | MEDICATED_PATCH | TRANSDERMAL | Status: DC
  Filled 2023-06-06: qty 1

## 2023-06-06 MED ORDER — BUDESON-GLYCOPYRROL-FORMOTEROL 160-9-4.8 MCG/ACT IN AERO
2.0000 | INHALATION_SPRAY | Freq: Two times a day (BID) | RESPIRATORY_TRACT | Status: DC
  Administered 2023-06-07 – 2023-06-09 (×5): 2 via RESPIRATORY_TRACT
  Filled 2023-06-06: qty 5.9

## 2023-06-06 MED ORDER — BENZONATATE 100 MG PO CAPS: 200.0000 mg | ORAL_CAPSULE | ORAL | Status: DC | PRN

## 2023-06-06 MED ORDER — SODIUM CHLORIDE 0.9 % IV SOLN
1.0000 g | Freq: Once | INTRAVENOUS | Status: AC
  Administered 2023-06-06: 1 g via INTRAVENOUS
  Filled 2023-06-06: qty 10

## 2023-06-06 MED ORDER — POLYETHYLENE GLYCOL 3350 17 G PO PACK: 17.0000 g | PACK | Freq: Every day | ORAL | Status: DC | PRN

## 2023-06-06 MED ORDER — GADOBUTROL 1 MMOL/ML IV SOLN
10.0000 mL | Freq: Once | INTRAVENOUS | Status: AC | PRN
  Administered 2023-06-06: 10 mL via INTRAVENOUS

## 2023-06-06 MED ORDER — SODIUM CHLORIDE 0.9% FLUSH
3.0000 mL | Freq: Two times a day (BID) | INTRAVENOUS | Status: DC
  Administered 2023-06-06 – 2023-06-09 (×5): 3 mL via INTRAVENOUS

## 2023-06-06 MED ORDER — SODIUM CHLORIDE 0.9 % IV SOLN
500.0000 mg | Freq: Once | INTRAVENOUS | Status: AC
  Administered 2023-06-06: 500 mg via INTRAVENOUS
  Filled 2023-06-06: qty 5

## 2023-06-06 MED ORDER — ONDANSETRON HCL 4 MG/2ML IJ SOLN: 4.0000 mg | Freq: Four times a day (QID) | INTRAMUSCULAR | Status: DC | PRN

## 2023-06-06 MED ORDER — PAROXETINE HCL 10 MG PO TABS
10.0000 mg | ORAL_TABLET | Freq: Every day | ORAL | Status: DC
Start: 2023-06-06 — End: 2023-06-09
  Administered 2023-06-06 – 2023-06-08 (×3): 10 mg via ORAL
  Filled 2023-06-06 (×3): qty 1

## 2023-06-06 MED ORDER — PREDNISONE 5 MG PO TABS
5.0000 mg | ORAL_TABLET | Freq: Every day | ORAL | Status: DC
  Administered 2023-06-07 – 2023-06-09 (×3): 5 mg via ORAL
  Filled 2023-06-06 (×3): qty 1

## 2023-06-06 MED ORDER — METRONIDAZOLE 500 MG/100ML IV SOLN
500.0000 mg | Freq: Once | INTRAVENOUS | Status: AC
  Administered 2023-06-06: 500 mg via INTRAVENOUS
  Filled 2023-06-06: qty 100

## 2023-06-06 MED ORDER — HYDRALAZINE HCL 20 MG/ML IJ SOLN: 5.0000 mg | Freq: Four times a day (QID) | INTRAMUSCULAR | Status: DC | PRN

## 2023-06-06 MED ORDER — HYDROMORPHONE HCL 1 MG/ML IJ SOLN
1.0000 mg | INTRAMUSCULAR | Status: DC | PRN
  Administered 2023-06-06 – 2023-06-09 (×8): 1 mg via INTRAVENOUS
  Filled 2023-06-06 (×9): qty 1

## 2023-06-06 MED ORDER — METHIMAZOLE 5 MG PO TABS
5.0000 mg | ORAL_TABLET | Freq: Every day | ORAL | Status: DC
  Administered 2023-06-07 – 2023-06-08 (×2): 5 mg via ORAL
  Filled 2023-06-06 (×3): qty 1

## 2023-06-06 NOTE — ED Notes (Signed)
 Pt to ct via stretcher

## 2023-06-06 NOTE — H&P (Addendum)
 History and Physical    Patient: John [name] ZOX:[id number] DOB: 1960-08-18 DOA: 06/06/2023 DOS: the patient was seen and examined on 06/06/2023 PCP: Dorthy Gavia, MD  Patient coming from: Home  Chief Complaint:  Chief Complaint  Patient presents with   Weakness  .back pain HPI: John [name] is a 63 y.o. 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  He has had frequent visits in the ED on account of abdominal pain with associated nausea and vomiting.  He also complains of back pain.  Rates pain as 8/10 intensity.  He had been on hydrocodone  in the past but reports headache with that medication.  He was apparently prescribed with hydromorphone  recently during his last ED visit.  He returns to the ED complaining of abdominal pain, back pain.  He has been unable to ambulate due to his pain.  Denied any history of metastatic bone disease.  He however admits to prior kyphoplasty.  review of Systems: As mentioned in the history of present illness. All other systems reviewed and are negative. Past Medical History:  Diagnosis Date   Arthritis    Rheumatoid   Bacteremia due to Pseudomonas    C. difficile colitis    Chronic anxiety 11/08/2014   Cough 09/22/2021   COVID 2020   mild case   Depression    Drug-induced neutropenia (HCC) 08/11/2020   Family history of adverse reaction to anesthesia    brother with seizures had episode under anesthesia.  Patient has seizures as well.   GERD (gastroesophageal reflux disease)    History of radiation therapy 04/24/2020-05/16/2020   IMRT to right lung     Dr Retta Caster   History of radiation therapy    08/10/21-08/19/21-Dr. Retta Caster   History of radiation therapy    Abdomen- 03/21/23-03/31/23-Dr. Retta Caster   Neutropenia [facility]) 05/12/2020    Neutropenic fever (HCC) 05/12/2020   Normocytic anemia 04/18/2021   PAF (paroxysmal atrial fibrillation) (HCC)    CHADS2VSAC score 3   Rheumatoid aortitis    Secondary hypercoagulable state (HCC) 10/28/2020   Sepsis (HCC) 10/06/2020   Shortness of breath 12/18/2020   Squamous cell lung cancer [facility])    Past Surgical History:  Procedure Laterality Date   BRONCHIAL BRUSHINGS  03/27/2020   Procedure: BRONCHIAL BRUSHINGS;  Surgeon: Denson Flake, MD;  Location: [facility] ENDOSCOPY;  Service: Cardiopulmonary;;   BUBBLE STUDY  10/13/2020   Procedure: BUBBLE STUDY;  Surgeon: Hazle Lites, MD;  Location: [facility] ENDOSCOPY;  Service: Cardiovascular;;   CARDIOVERSION N/A 10/13/2020   Procedure: CARDIOVERSION;  Surgeon: Hazle Lites, MD;  Location: [facility] ENDOSCOPY;  Service: Cardiovascular;  Laterality: N/A;   CATARACT EXTRACTION  2016   at [facility]   FINE NEEDLE ASPIRATION  03/27/2020   Procedure: FINE NEEDLE ASPIRATION;  Surgeon: Denson Flake, MD;  Location: [facility] ENDOSCOPY;  Service: Cardiopulmonary;;   HIP SURGERY Left    IR KYPHO EA ADDL LEVEL THORACIC OR LUMBAR  10/12/2021   IR KYPHO LUMBAR INC FX REDUCE BONE BX UNI/BIL CANNULATION INC/IMAGING  10/12/2021   IR KYPHO THORACIC WITH BONE BIOPSY  09/07/2021   IR RADIOLOGIST EVAL & MGMT  09/03/2021   RADIOLOGY WITH ANESTHESIA N/A 10/12/2021   Procedure: L1 and L3 Kyphoplasty;  Surgeon: de Macedo Rodrigues, [place], MD;  Location: [facility] OR;  Service: Radiology;  Laterality: N/A;   TEE WITHOUT CARDIOVERSION  N/A 10/13/2020   Procedure: TRANSESOPHAGEAL ECHOCARDIOGRAM (TEE);  Surgeon: Hazle Lites, MD;  Location: [facility] ENDOSCOPY;  Service: Cardiovascular;  Laterality: N/A;   VIDEO BRONCHOSCOPY WITH ENDOBRONCHIAL ULTRASOUND N/A 03/27/2020   Procedure: VIDEO BRONCHOSCOPY WITH ENDOBRONCHIAL ULTRASOUND;  Surgeon: Denson Flake, MD;  Location: [facility] ENDOSCOPY;  Service: Cardiopulmonary;  Laterality: N/A;   Social History:  reports that he quit smoking about 7 years ago. His  smoking use included cigarettes. He started smoking about 37 years ago. He has a 30 pack-year smoking history. He has never used smokeless tobacco. He reports that he does not currently use alcohol. He reports that he does not use drugs.  Allergies  Allergen Reactions   Ventolin  [Albuterol ] Other (See Comments)    Patient has had episode of atrial fib following administration of albuterol  (tolerates Xopenex  well)   Lipitor [Atorvastatin ] Other (See Comments)    Arthralgias    Oxycodone      Family History  Problem Relation Age of Onset   Arrhythmia Mother        has PPM   Heart disease Father        Died at 63, started in his 44s, heart attacks, had PPM and ICD    Prior to Admission medications   Medication Sig Start Date End Date Taking? Authorizing Provider  acetaminophen  (TYLENOL ) 500 MG tablet Take 500 mg by mouth daily as needed for moderate pain (pain score 4-6), fever or headache.    [provider]  amiodarone  (PACERONE ) 200 MG tablet START by taking 2 tablets (400mg ) daily for 2 weeks THEN reduce to 1 tablet (200mg ) daily Patient taking differently: Take 200 mg by mouth daily. 04/02/23   Ursuy, Renee Lynn, PA-C  benzonatate  (TESSALON ) 200 MG capsule Take 1 capsule (200 mg total) by mouth 3 (three) times daily as needed. 06/02/23 06/01/24  Parrett, Macdonald Savoy, NP  ciprofloxacin  (CIPRO ) 500 MG tablet Take 1 tablet (500 mg total) by mouth daily with breakfast. 06/02/23   Ivor Mars, MD  ELIQUIS  5 MG TABS tablet TAKE 1 TABLET(5 MG) BY MOUTH TWICE DAILY 05/12/23   Jacqueline Matsu, MD  Evolocumab  (REPATHA  SURECLICK) 140 MG/ML SOAJ ADMINISTER 1 ML UNDER THE SKIN EVERY 14 DAYS 05/16/23   Jacqueline Matsu, MD  fentaNYL  (DURAGESIC ) 25 MCG/HR Place 1 patch onto the skin every 3 (three) days. 04/29/23   Ivor Mars, MD  HYDROcodone  bit-homatropine (HYDROMET) 5-1.5 MG/5ML syrup Take 5 mLs by mouth at bedtime as needed for cough. Use with caution, sedating. Do not combine with other  pain medications 06/02/23   Parrett, Macdonald Savoy, NP  lidocaine  (LIDODERM ) 5 % Place 1 patch onto the skin daily. Remove & Discard patch within 12 hours or as directed by MD 04/29/23   Ivor Mars, MD  methimazole  (TAPAZOLE ) 5 MG tablet Take 5 mg by mouth daily. 05/08/23   [provider]  metoprolol  succinate (TOPROL -XL) 25 MG 24 hr tablet TAKE 1 TABLET(25 MG) BY MOUTH DAILY 05/18/23   Jacqueline Matsu, MD  ondansetron  (ZOFRAN ) 8 MG tablet Take 1 tablet (8 mg total) by mouth every 8 (eight) hours as needed for nausea or vomiting. 05/26/23   Ivor Mars, MD  oxyCODONE -acetaminophen  (PERCOCET/ROXICET) 5-325 MG tablet Take 2 tablets by mouth every 6 (six) hours as needed. 05/26/23   Ivor Mars, MD  PARoxetine  (PAXIL ) 10 MG tablet Take 1 tablet (10 mg total) by mouth at bedtime. 01/11/23   Dorthy Gavia, MD  predniSONE  (DELTASONE )  5 MG tablet Take 1 tablet (5 mg total) by mouth daily with breakfast. 08/18/22   Ivor Mars, MD  prochlorperazine  (COMPAZINE ) 10 MG tablet Take 1 tablet (10 mg total) by mouth every 6 (six) hours as needed for nausea or vomiting. 05/26/23   Ivor Mars, MD  TRELEGY ELLIPTA  100-62.5-25 MCG/ACT AEPB INHALE 1 PUFF INTO THE LUNGS DAILY 12/04/22   Denson Flake, MD    Physical Exam: Vitals:   06/06/23 2045 06/06/23 2100 06/06/23 2117 06/06/23 2130  BP: 103/64 106/63  100/64  Pulse: 72 73  73  Resp: 16 15  14   Temp:   98.3 F (36.8 C)   TempSrc:   Oral   SpO2: 98% 98%  94%  Weight:      Height:       General: Obese gentleman seen laying comfortably in bed.  Not in any acute distress HEENT: Oral mucosa moist, anicteric.  Not pale not jaundiced. Neck: Supple with no JVD Chest: Clinically diminished bilaterally with no added sounds. Abdomen soft nontender: Mild guarding through the abdomen. CNS shows no focal deficit. Back: cannot be visualized at this time due to discomfort. Skin negative for any new rash. Data Reviewed: CT scan  IMPRESSION: 1. No evidence for thoracic aortic dissection or aneurysm. 2. Stable 3.4 cm infrarenal abdominal aortic aneurysm. Recommend follow-up ultrasound every 3 years. 3. Stable emphysema and fibrotic changes. 4. Mild increase in airspace consolidation in the right upper lobe worrisome for worsening pneumonia. 5. Cholelithiasis with distended gallbladder. No pericholecystic inflammation. 6. Stable indeterminate right adrenal nodule. 7. Mild splenomegaly.  Labs show WBC of 2.6, hemoglobin 11.9, hematocrit 36, platelet count 157.  PT 19.6, INR 1.6.  Sodium was 130, potassium 3.5, chloride 96, glucose 138, BUN 12, creatinine 1.30 calcium  ionized 1.02 magnesium  was 1.8.  AST was 61, ALT 87 direct bilirubin was 1.8 indirect bilirubin 2.9.  Lipase has been ordered and pending.  CT scan showed pancreas to be unremarkable.  Assessment and Plan:  63 year old gentleman with history of squamous cell lung cancer, with adrenal metastasis, chronic back pain, paroxysmal atrial fibrillation, hyperthyroidism.  He presents for the second time in a month with complaints of abdominal pain radiating to his back.  CT scan done on this admission did reveal incidental finding of choledocholithiasis with a 16mm stone at the gallbladder neck.  Lipase has been ordered, MRCP will also be ordered.  GI and general surgery has been consulted  Abdominal pain radiating to his back: Patient also has associated chronic back pain.  CT scan did reveal findings suggesting cholelithiasis with distended gallbladder.  He has elevated AST, ALT and total bilirubin.  Lipase has been ordered in the ED and pending . Pain will be optimized with analgesics.  Patient reports being on hydromorphone  at home due to headaches with hydrocodone  or oxycodone .  Concerning findings of cholelithiasis, Patient may benefit from MRCP to evaluate for any obstructive biliary disease.  Will defer  further management to GI and general surgery.  Chronic  back pain: Compression fractures found in T10-L5 with cement augmentation.  This was seen on prior CT scan.  Paroxysmal atrial fibrillation: Rate and rhythm controlled on amiodarone  and metoprolol .  Will continue on same.  Patient is also on Eliquis .  Consider holding Eliquis  if any procedure anticipated.  Choledocholithiasis: Obstructive lesion in gallbladder neck.  Measuring 16 mm.  Patient may benefit from ERCP.  Will consult with GI.  COPD: Chronic pulmonary fibrosis.  Patient with known disease  of squamous cell lung cancer.?  Increased opacity in the right upper lobe.  Patient is currently asymptomatic.  Will monitor for now.  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 Inpatient status is judged to be reasonable and necessary in order to provide the required intensity of service to ensure the patient's safety. The patient's presenting symptoms, physical exam findings, and initial radiographic and laboratory data in the context of their chronic comorbidities is felt to place them at high risk for further clinical deterioration. Furthermore, it is not anticipated that the patient will be medically stable for discharge from the hospital within 2 midnights of admission.   * I certify that at the point of admission it is my clinical judgment that the patient will require inpatient hospital care spanning beyond 2 midnights from the point of admission due to high intensity of service, high risk for further deterioration and high frequency of surveillance required.*  Author: Theodora Fish, MD 06/06/2023 10:11 PM  For on call review [email or web address].

## 2023-06-06 NOTE — ED Provider Notes (Signed)
 4:39 PM Assumed care of patient from off-going team. For more details, please see note from same day.  In brief, this is a 63 y.o. male with increased oxygen requirement and likely PNA. Has also a bump in bilirubin, pending RUQ US . Usually wears 3-5L at home when he needs it, but now has 3L on 24/7.   Plan/Dispo at time of sign-out & ED Course since sign-out: [ ]  admission for PNA and increased O2 requirement. Has chronic gallstones but possibly will need surgery consult.  BP 116/68   Pulse 71   Temp 98.1 F (36.7 C) (Oral)   Resp 17   Ht 5\' 11"  (1.803 m)   Wt 113.4 kg   SpO2 98%   BMI 34.87 kg/m    ED Course:   Clinical Course as of 06/06/23 2213  Mon Jun 06, 2023  1740 D/w Dr. Dorrie Gaudier w/ surgery who will have team see patient tomorrow morning. [HN]  2012 Pt with soft pressures, getting another fluid bolus [HN]  2013 Still awaiting RUQ US  read, calling [place] [facility] [HN]  2027 US  Abdomen Limited RUQ (LIVER/GB) 1. Cholelithiasis with slightly distended gallbladder and slight increased wall thickness but no sonographic Murphy. If there is high clinical concern for acute cholecystitis, consider correlation with nuclear medicine hepatobiliary imaging 2. Echogenic liver parenchyma consistent with hepatic steatosis and or hepatocellular disease.   [HN]    Clinical Course User Index [HN] Merdis Stalling, MD    Patient with increased O2 requirement, likely PNA, possible cholecystitis. Consulted to surgery. Will admit to medicine.   ------------------------------- Annita Kindle, MD Emergency Medicine  This note was created using dictation software, which may contain spelling or grammatical errors.  CRITICAL CARE Performed by: Merdis Stalling Total critical care time: 30 minutes Critical care time was exclusive of separately billable procedures and treating other patients. Critical care was necessary to treat or prevent imminent or life-threatening deterioration. Critical  care was time spent personally by me on the following activities: development of treatment plan with patient and/or surrogate as well as nursing, discussions with consultants, evaluation of patient's response to treatment, examination of patient, obtaining history from patient or surrogate, ordering and performing treatments and interventions, ordering and review of laboratory studies, ordering and review of radiographic studies, pulse oximetry and re-evaluation of patient's condition.    Merdis Stalling, MD 06/06/23 2214

## 2023-06-06 NOTE — ED Provider Notes (Signed)
 [place] EMERGENCY DEPARTMENT AT [facility] Provider Note   CSN: [id number] Arrival date & time: 06/06/23  0945     History  Chief Complaint  Patient presents with   Weakness    John [name] is a 63 y.o. male.   Weakness Associated symptoms: abdominal pain, nausea and vomiting   Patient presents for multiple complaints.  Medical history includes atrial fibrillation, COPD, HLD, seizures, CAD, GERD, lung cancer.  His oncologist is Dr. Maria Shiner.  He was recently started on new chemotherapy agents.  Current therapy includes Xgeva , XRT, Gemzar /Navelbine .  Other medications include amiodarone , Eliquis , low-dose prednisone , narcotic pain medication.  When he went for his infusion on Thursday, he was noted to have leukopenia.  He was given a shot for stimulation of white blood cells.  He has had worsening generalized weakness since that time.  He had vomiting yesterday and has been unable to eat or drink since that time.  He has had acute on chronic lower abdominal pain.  Yesterday, he had a fall.  He attributes the fall to lower extremity weakness.  He has chronic low back pain which has worsened since the fall.  At baseline, he is on 3 L of supplemental oxygen.  He denies current nausea.  He states he did take an antiemetic shortly prior to arrival.  He does have ongoing pain in lower back and lower abdomen.  His last bowel movement was 4 days ago.     Home Medications Prior to Admission medications   Medication Sig Start Date End Date Taking? Authorizing Provider  acetaminophen  (TYLENOL ) 500 MG tablet Take 500 mg by mouth daily as needed for moderate pain (pain score 4-6), fever or headache.    [provider]  amiodarone  (PACERONE ) 200 MG tablet START by taking 2 tablets (400mg ) daily for 2 weeks THEN reduce to 1 tablet (200mg ) daily Patient taking differently: Take 200 mg by mouth daily. 04/02/23   Ursuy, Renee Lynn, PA-C  benzonatate  (TESSALON ) 200 MG capsule Take 1  capsule (200 mg total) by mouth 3 (three) times daily as needed. 06/02/23 06/01/24  Parrett, Macdonald Savoy, NP  ciprofloxacin  (CIPRO ) 500 MG tablet Take 1 tablet (500 mg total) by mouth daily with breakfast. 06/02/23   Ivor Mars, MD  ELIQUIS  5 MG TABS tablet TAKE 1 TABLET(5 MG) BY MOUTH TWICE DAILY 05/12/23   Jacqueline Matsu, MD  Evolocumab  (REPATHA  SURECLICK) 140 MG/ML SOAJ ADMINISTER 1 ML UNDER THE SKIN EVERY 14 DAYS 05/16/23   Jacqueline Matsu, MD  fentaNYL  (DURAGESIC ) 25 MCG/HR Place 1 patch onto the skin every 3 (three) days. 04/29/23   Ivor Mars, MD  HYDROcodone  bit-homatropine (HYDROMET) 5-1.5 MG/5ML syrup Take 5 mLs by mouth at bedtime as needed for cough. Use with caution, sedating. Do not combine with other pain medications 06/02/23   Parrett, Macdonald Savoy, NP  lidocaine  (LIDODERM ) 5 % Place 1 patch onto the skin daily. Remove & Discard patch within 12 hours or as directed by MD 04/29/23   Ivor Mars, MD  methimazole  (TAPAZOLE ) 5 MG tablet Take 5 mg by mouth daily. 05/08/23   [provider]  metoprolol  succinate (TOPROL -XL) 25 MG 24 hr tablet TAKE 1 TABLET(25 MG) BY MOUTH DAILY 05/18/23   Jacqueline Matsu, MD  ondansetron  (ZOFRAN ) 8 MG tablet Take 1 tablet (8 mg total) by mouth every 8 (eight) hours as needed for nausea or vomiting. 05/26/23   Ivor Mars, MD  oxyCODONE -acetaminophen  (PERCOCET/ROXICET) 5-325 MG  tablet Take 2 tablets by mouth every 6 (six) hours as needed. 05/26/23   Ivor Mars, MD  PARoxetine  (PAXIL ) 10 MG tablet Take 1 tablet (10 mg total) by mouth at bedtime. 01/11/23   Dorthy Gavia, MD  predniSONE  (DELTASONE ) 5 MG tablet Take 1 tablet (5 mg total) by mouth daily with breakfast. 08/18/22   Ivor Mars, MD  prochlorperazine  (COMPAZINE ) 10 MG tablet Take 1 tablet (10 mg total) by mouth every 6 (six) hours as needed for nausea or vomiting. 05/26/23   Ivor Mars, MD  TRELEGY ELLIPTA  100-62.5-25 MCG/ACT AEPB INHALE 1 PUFF INTO THE LUNGS DAILY 12/04/22    Denson Flake, MD      Allergies    Ventolin  [albuterol ], Lipitor [atorvastatin ], and Oxycodone     Review of Systems   Review of Systems  Constitutional:  Positive for activity change, appetite change and fatigue.  Gastrointestinal:  Positive for abdominal pain, constipation, nausea and vomiting.  Musculoskeletal:  Positive for back pain.  Neurological:  Positive for weakness.    Physical Exam Updated Vital Signs BP 116/68   Pulse 71   Temp 98.1 F (36.7 C) (Oral)   Resp 17   Ht 5\' 11"  (1.803 m)   Wt 113.4 kg   SpO2 98%   BMI 34.87 kg/m  Physical Exam Vitals and nursing note reviewed.  Constitutional:      General: He is not in acute distress.    Appearance: Normal appearance. He is well-developed. He is not ill-appearing, toxic-appearing or diaphoretic.  HENT:     Head: Normocephalic and atraumatic.     Right Ear: External ear normal.     Left Ear: External ear normal.     Nose: Nose normal.     Mouth/Throat:     Mouth: Mucous membranes are moist.  Eyes:     General: No scleral icterus.    Extraocular Movements: Extraocular movements intact.     Conjunctiva/sclera: Conjunctivae normal.  Cardiovascular:     Rate and Rhythm: Normal rate and regular rhythm.     Heart sounds: No murmur heard. Pulmonary:     Effort: Pulmonary effort is normal. No respiratory distress.     Breath sounds: Normal breath sounds. No wheezing or rales.  Chest:     Chest wall: No tenderness.  Abdominal:     General: There is no distension.     Palpations: Abdomen is soft.     Tenderness: There is abdominal tenderness. There is no guarding or rebound.  Musculoskeletal:        General: Tenderness (L4) present. No swelling or deformity.     Cervical back: Normal range of motion and neck supple.  Skin:    General: Skin is warm and dry.     Capillary Refill: Capillary refill takes less than 2 seconds.     Coloration: Skin is not jaundiced or pale.  Neurological:     Mental Status: He  is alert and oriented to person, place, and time.     Cranial Nerves: No cranial nerve deficit.     Sensory: No sensory deficit.     Motor: Weakness (Proximal muscle strength of right leg seems slightly weaker when compared to the left) present.  Psychiatric:        Mood and Affect: Mood normal.        Behavior: Behavior normal.     ED Results / Procedures / Treatments   Labs (all labs ordered are listed, but only abnormal results are  displayed) Labs Reviewed  BASIC METABOLIC PANEL WITH GFR - Abnormal; Notable for the following components:      Result Value   Sodium 127 (*)    Potassium 3.4 (*)    Chloride 95 (*)    Glucose, Bld 156 (*)    Calcium  8.2 (*)    All other components within normal limits  CBC - Abnormal; Notable for the following components:   WBC 2.6 (*)    RBC 4.11 (*)    Hemoglobin 11.9 (*)    HCT 35.6 (*)    nRBC 1.9 (*)    All other components within normal limits  URINALYSIS, ROUTINE W REFLEX MICROSCOPIC - Abnormal; Notable for the following components:   Color, Urine AMBER (*)    All other components within normal limits  PROTIME-INR - Abnormal; Notable for the following components:   Prothrombin Time 19.6 (*)    INR 1.6 (*)    All other components within normal limits  HEPATIC FUNCTION PANEL - Abnormal; Notable for the following components:   Total Protein 6.4 (*)    Albumin  3.0 (*)    AST 61 (*)    ALT 87 (*)    Alkaline Phosphatase 142 (*)    Total Bilirubin 4.7 (*)    Bilirubin, Direct 1.8 (*)    Indirect Bilirubin 2.9 (*)    All other components within normal limits  CBG MONITORING, ED - Abnormal; Notable for the following components:   Glucose-Capillary 132 (*)    All other components within normal limits  I-STAT CHEM 8, ED - Abnormal; Notable for the following components:   Sodium 130 (*)    Chloride 96 (*)    Creatinine, Ser 1.30 (*)    Glucose, Bld 138 (*)    Calcium , Ion 1.09 (*)    Hemoglobin 10.5 (*)    HCT 31.0 (*)    All other  components within normal limits  TROPONIN I (HIGH SENSITIVITY) - Abnormal; Notable for the following components:   Troponin I (High Sensitivity) 24 (*)    All other components within normal limits  TROPONIN I (HIGH SENSITIVITY) - Abnormal; Notable for the following components:   Troponin I (High Sensitivity) 21 (*)    All other components within normal limits  CULTURE, BLOOD (ROUTINE X 2)  CULTURE, BLOOD (ROUTINE X 2)  MAGNESIUM   LEGIONELLA PNEUMOPHILA SEROGP 1 UR AG  I-STAT CG4 LACTIC ACID, ED    EKG EKG Interpretation Date/Time:  Monday June 06 2023 09:58:01 EDT Ventricular Rate:  110 PR Interval:  132 QRS Duration:  107 QT Interval:  377 QTC Calculation: 510 R Axis:   -76  Text Interpretation: Sinus tachycardia Atrial premature complexes Incomplete RBBB and LAFB Abnormal R-wave progression, early transition Consider anterior infarct Prolonged QT interval Confirmed by Iva Mariner ([id number]) on 06/06/2023 10:25:48 AM  Radiology CT Angio Chest/Abd/Pel for Dissection W and/or Wo Contrast Result Date: 06/06/2023 CLINICAL DATA:  Acute aortic syndrome suspected. Increased white blood cell count. EXAM: CT ANGIOGRAPHY CHEST, ABDOMEN AND PELVIS TECHNIQUE: Non-contrast CT of the chest was initially obtained. Multidetector CT imaging through the chest, abdomen and pelvis was performed using the standard protocol during bolus administration of intravenous contrast. Multiplanar reconstructed images and MIPs were obtained and reviewed to evaluate the vascular anatomy. RADIATION DOSE REDUCTION: This exam was performed according to the departmental dose-optimization program which includes automated exposure control, adjustment of the mA and/or kV according to patient size and/or use of iterative reconstruction technique. CONTRAST:  100mL OMNIPAQUE   IOHEXOL  350 MG/ML SOLN COMPARISON:  CT chest abdomen and pelvis 04/29/2023. FINDINGS: CTA CHEST FINDINGS Cardiovascular: Preferential opacification of the  thoracic aorta. No evidence of thoracic aortic aneurysm or dissection. Normal heart size. No pericardial effusion. There are atherosclerotic calcifications of the aorta. Mediastinum/Nodes: No enlarged mediastinal, hilar, or axillary lymph nodes. Thyroid  gland, trachea, and esophagus demonstrate no significant findings. Lungs/Pleura: Moderate emphysematous changes are again seen. Fibrotic changes in the lower lung predominance appear similar to the prior study. Patchy ground-glass opacities in the inferior right lower lobe and airspace consolidation in the superior segment of the right lower lobe and adjacent right upper lobe are again seen. Right upper lobe airspace consolidation has mildly increased. There is no pneumothorax or pleural effusion. Musculoskeletal: Healed rib fractures are again noted. No acute fractures are seen. T12 vertebral plasty changes are chronic fresh fracture deformity of T11 is unchanged. Review of the MIP images confirms the above findings. CTA ABDOMEN AND PELVIS FINDINGS VASCULAR Aorta: Infrarenal abdominal aortic aneurysm measuring 3.4 cm appears unchanged. There are atherosclerotic calcifications of the aorta. There is no dissection or inflammation. Celiac: Patent without evidence of aneurysm, dissection, vasculitis or significant stenosis. SMA: Patent without evidence of aneurysm, dissection, vasculitis or significant stenosis. Renals: Both renal arteries are patent without evidence of aneurysm, dissection, vasculitis, fibromuscular dysplasia or significant stenosis. IMA: Patent without evidence of aneurysm, dissection, vasculitis or significant stenosis. Inflow: Patent without evidence of aneurysm, dissection, vasculitis or significant stenosis. Calcified atherosclerotic disease is present. Veins: No obvious venous abnormality within the limitations of this arterial phase study. Review of the MIP images confirms the above findings. NON-VASCULAR Hepatobiliary: Gallbladder is distended.  There is a gallstone in the gallbladder bladder neck measuring 16 mm. There is no pericholecystic inflammation. No focal liver lesions are identified. There is no biliary ductal dilatation. Pancreas: Unremarkable. No pancreatic ductal dilatation or surrounding inflammatory changes. Spleen: The spleen is mildly enlarged. Adrenals/Urinary Tract: Indeterminate right adrenal nodule measuring 2.6 cm is unchanged. Left adrenal gland is within normal limits. Left renal cyst measuring 9 mm is unchanged. There is no hydronephrosis in either kidney. The bladder is within normal limits. Stomach/Bowel: Stomach is within normal limits. Appendix appears normal. No evidence of bowel wall thickening, distention, or inflammatory changes. Lymphatic: No enlarged lymph nodes are seen. Reproductive: Prostate gland is within normal limits. Other: There are small fat containing left inguinal and umbilical hernias. There is no ascites. Musculoskeletal: L1 and L3 vertebroplasty changes are present. Review of the MIP images confirms the above findings. IMPRESSION: 1. No evidence for thoracic aortic dissection or aneurysm. 2. Stable 3.4 cm infrarenal abdominal aortic aneurysm. Recommend follow-up ultrasound every 3 years. 3. Stable emphysema and fibrotic changes. 4. Mild increase in airspace consolidation in the right upper lobe worrisome for worsening pneumonia. 5. Cholelithiasis with distended gallbladder. No pericholecystic inflammation. 6. Stable indeterminate right adrenal nodule. 7. Mild splenomegaly. Aortic Atherosclerosis (ICD10-I70.0) and Emphysema (ICD10-J43.9). Electronically Signed   By: Tyron Gallon M.D.   On: 06/06/2023 16:12   DG Chest Port 1 View Result Date: 06/06/2023 CLINICAL DATA:  63 year old male with possible sepsis. Metastatic lung cancer. Fall yesterday. EXAM: PORTABLE CHEST 1 VIEW COMPARISON:  Restaging CT Chest, Abdomen, and Pelvis today are reported separately. 04/29/2023. FINDINGS: Portable AP semi upright  view at 1024 hours. Chronically low lung volumes, slightly lower when compared to 04/29/2023 radiographs today. Stable mediastinal contours with right hilar architectural distortion. Combined right lung fibrosis and emphysema demonstrated by CT last month. No superimposed pneumothorax,  pulmonary edema, pleural effusion, acute lung opacity. Visualized tracheal air column is within normal limits. Stable visualized osseous structures. Paucity of bowel gas. IMPRESSION: No acute cardiopulmonary abnormality; low lung volumes with underlying emphysema and post treatment right lung fibrosis. Electronically Signed   By: Marlise Simpers M.D.   On: 06/06/2023 12:41   MR Lumbar Spine W Wo Contrast Result Date: 06/06/2023 CLINICAL DATA:  63 year old male with stage IV lung cancer. Fall yesterday. Low back pain. EXAM: MRI LUMBAR SPINE WITHOUT AND WITH CONTRAST TECHNIQUE: Multiplanar and multiecho pulse sequences of the lumbar spine were obtained without and with intravenous contrast. CONTRAST:  10mL GADAVIST  GADOBUTROL  1 MMOL/ML IV SOLN COMPARISON:  Lumbar MRI 08/20/2021.  Lumbar spine CT 10/02/2021. FINDINGS: Segmentation:  Normal on the comparison CT. Alignment: Lumbar lordosis not significantly changed from the 2023 CT. Vertebrae: Chronic, and several previously augmented, compression fractures T11 through L5. The T11 compression is new since 10/02/2021, but chronic with absent marrow edema. The other levels appear stable since 10/02/2021. Background bone marrow signal is within normal limits. Visible sacrum and SI joints are intact. No convincing marrow edema or acute osseous abnormality. Conus medullaris and cauda equina: Conus extends to the T11 level. Conu No lower spinal cord or conus signal abnormality. No abnormal intradural enhancement. No dural thickening. Paraspinal and other soft tissues: Infrarenal abdominal aortic aneurysm, approximately 36 mm diameter now on series 9, image 12. This compares to 34 mm on 10/02/2021.  Negative visualized posterior paraspinal soft tissues. Disc levels: Spinal stenosis throughout the lumbar spine related to disc bulging and mild retropulsion. This is mild to moderate at most levels. But moderate to severe multifactorial at the L4-L5 level. IMPRESSION: 1. Diffuse lower thoracic and lumbar compression fractures are chronic and appear likely to be benign. No acute or metastatic process identified in the lumbar spine. 2. Diffuse multifactorial lumbar spinal stenosis, moderate to severe at L4-L5. 3. Abdominal aortic aneurysm measuring 3.6 cm. Recommend surveillance ultrasound in 3 years. Reference: Journal of Vascular Surgery 67.1 (2018): 2-77. J Am Coll Radiol [id number] 6593. Electronically Signed   By: Marlise Simpers M.D.   On: 06/06/2023 12:39    Procedures Procedures    Medications Ordered in ED Medications  0.9 %  sodium chloride  infusion ( Intravenous New Bag/Given 06/06/23 1239)  cefTRIAXone  (ROCEPHIN ) 1 g in sodium chloride  0.9 % 100 mL IVPB (1 g Intravenous New Bag/Given 06/06/23 1638)  azithromycin  (ZITHROMAX ) 500 mg in sodium chloride  0.9 % 250 mL IVPB (has no administration in time range)  HYDROmorphone  (DILAUDID ) injection 1 mg (has no administration in time range)  metroNIDAZOLE  (FLAGYL ) IVPB 500 mg (has no administration in time range)  lactated ringers  bolus 1,000 mL (0 mLs Intravenous Stopped 06/06/23 1218)  HYDROmorphone  (DILAUDID ) injection 1 mg (1 mg Intravenous Given 06/06/23 1044)  potassium chloride  SA (KLOR-CON  M) CR tablet 40 mEq (40 mEq Oral Given 06/06/23 1237)  gadobutrol  (GADAVIST ) 1 MMOL/ML injection 10 mL (10 mLs Intravenous Contrast Given 06/06/23 1132)  iohexol  (OMNIPAQUE ) 350 MG/ML injection 100 mL (100 mLs Intravenous Contrast Given 06/06/23 1220)    ED Course/ Medical Decision Making/ A&P                                 Medical Decision Making Amount and/or Complexity of Data Reviewed Labs: ordered. Radiology: ordered.  Risk Prescription drug  management.   This patient presents to the ED for concern of generalized weakness, this involves  an extensive number of treatment options, and is a complaint that carries with it a high risk of complications and morbidity.  The differential diagnosis includes medication side effect, infection, metabolic derangements, dehydration, acute injuries from fall   Co morbidities that complicate the patient evaluation  atrial fibrillation, COPD, HLD, seizures, CAD, GERD, lung cancer   Additional history obtained:  Additional history obtained from patient's wife External records from outside source obtained and reviewed including MR   Lab Tests:  I Ordered, and personally interpreted labs.  The pertinent results include: Leukopenia present, improved from lab work 4 days ago, worsened anemia new elevation in bilirubin, low levels of sodium, potassium, chloride, and calcium .   Imaging Studies ordered:  I ordered imaging studies including chest x-ray, MRI of lumbar spine, CTA dissection study, right upper quadrant ultrasound I independently visualized and interpreted imaging which showed no acute findings on MRI.  CTA shows stable AAA, increase in right upper lobe airspace consolidation consistent with pneumonia, gallstone present in gallbladder neck with distended gallbladder (similar to prior imaging) I agree with the radiologist interpretation   Cardiac Monitoring: / EKG:  The patient was maintained on a cardiac monitor.  I personally viewed and interpreted the cardiac monitored which showed an underlying rhythm of: Sinus rhythm   Problem List / ED Course / Critical interventions / Medication management  Patient presenting for multiple complaints.  He has acute on chronic lower abdominal pain, nausea, vomiting, and p.o. intolerance since yesterday.  He has generalized weakness, primarily in his legs.  This did lead to a fall yesterday which has worsened his chronic low back pain.  On  arrival in the ED, SpO2 was in the low 90s on 4 L of supplemental oxygen.  At baseline, he is on 3 L.  His vital signs are otherwise notable for soft blood pressures and tachycardia.  On exam, he is alert and oriented.  His breathing is unlabored.  He has some mild lower abdominal tenderness without guarding.  He has tenderness in area of L3-4-5.  He denies any diminished in station in his lower extremities.  He has had 4 days without a bowel movement which he states is common for him given his chronic use of narcotics.  He denies any difficulty urinating.  He does seem to have slightly diminished strength on his proximal right leg, when compared to the left.  Dilaudid  was ordered for analgesia.  Broad workup was initiated.  Patient's lab work notable for hyponatremia, new elevation in bilirubin, acute on chronic anemia, and leukopenia that is improved from lab work 4 days ago.  On MRI, there were chronic changes identified only.  On CTA dissection study, there is concern for worsening right upper lobe pneumonia.  Antibiotics were ordered.  Patient also was found to have a large gallstone in his gallbladder neck.  On reassessment, reports improved nausea and actually has a return of appetite.  He has no right upper quadrant pain or tenderness.  Per chart review, he has had this gallstone identified in a similar location on prior imaging.  Given his new elevation in bilirubin and emesis for the past 24 hours, there is concern of a cholecystitis.  Ultrasound was ordered to further evaluate.  Care of patient was signed out to oncoming ED provider. I ordered medication including Dilaudid  for analgesia; IV fluids for hydration; ceftriaxone  and azithromycin  for right upper lobe pneumonia; Flagyl  for possible cholecystitis; potassium chloride  for hypokalemia Reevaluation of the patient after these medicines showed  that the patient improved I have reviewed the patients home medicines and have made adjustments as  needed   Consultations Obtained:  I requested consultation with the general surgeon,  and discussed lab and imaging findings as well as pertinent plan - they recommend: (Callback pending at time of signout)   Social Determinants of Health:  Has access to outpatient care  CRITICAL CARE Performed by: Iva Mariner   Total critical care time: 32 minutes  Critical care time was exclusive of separately billable procedures and treating other patients.  Critical care was necessary to treat or prevent imminent or life-threatening deterioration.  Critical care was time spent personally by me on the following activities: development of treatment plan with patient and/or surrogate as well as nursing, discussions with consultants, evaluation of patient's response to treatment, examination of patient, obtaining history from patient or surrogate, ordering and performing treatments and interventions, ordering and review of laboratory studies, ordering and review of radiographic studies, pulse oximetry and re-evaluation of patient's condition.        Final Clinical Impression(s) / ED Diagnoses Final diagnoses:  Pneumonia of right upper lobe due to infectious organism  Acute on chronic respiratory failure with hypoxia [facility])    Rx / DC Orders ED Discharge Orders     None         Iva Mariner, MD 06/06/23 1650

## 2023-06-06 NOTE — ED Notes (Signed)
 Dr. Drury Geralds notified of pt BP

## 2023-06-06 NOTE — ED Notes (Signed)
 Pt to MRI via WC

## 2023-06-06 NOTE — ED Triage Notes (Signed)
 Received injection to increase WBCs last Thursday and has been declining since. Pt has Stage IV lung Cancer. Pt fell yesterday, mid back pain since.

## 2023-06-06 NOTE — ED Notes (Signed)
 Hospitalist at bedside

## 2023-06-07 ENCOUNTER — Inpatient Hospital Stay (HOSPITAL_COMMUNITY)

## 2023-06-07 ENCOUNTER — Encounter (HOSPITAL_COMMUNITY): Payer: Self-pay | Admitting: Internal Medicine

## 2023-06-07 DIAGNOSIS — C7971 Secondary malignant neoplasm of right adrenal gland: Secondary | ICD-10-CM

## 2023-06-07 DIAGNOSIS — R109 Unspecified abdominal pain: Secondary | ICD-10-CM

## 2023-06-07 DIAGNOSIS — K8020 Calculus of gallbladder without cholecystitis without obstruction: Secondary | ICD-10-CM

## 2023-06-07 DIAGNOSIS — C3490 Malignant neoplasm of unspecified part of unspecified bronchus or lung: Secondary | ICD-10-CM

## 2023-06-07 DIAGNOSIS — K8080 Other cholelithiasis without obstruction: Secondary | ICD-10-CM

## 2023-06-07 DIAGNOSIS — I4891 Unspecified atrial fibrillation: Secondary | ICD-10-CM

## 2023-06-07 DIAGNOSIS — Z7901 Long term (current) use of anticoagulants: Secondary | ICD-10-CM

## 2023-06-07 DIAGNOSIS — D649 Anemia, unspecified: Secondary | ICD-10-CM

## 2023-06-07 DIAGNOSIS — C3491 Malignant neoplasm of unspecified part of right bronchus or lung: Secondary | ICD-10-CM | POA: Diagnosis not present

## 2023-06-07 DIAGNOSIS — D61818 Other pancytopenia: Secondary | ICD-10-CM

## 2023-06-07 DIAGNOSIS — D696 Thrombocytopenia, unspecified: Secondary | ICD-10-CM

## 2023-06-07 LAB — BASIC METABOLIC PANEL WITH GFR
Anion gap: 4 — ABNORMAL LOW (ref 5–15)
BUN: 10 mg/dL (ref 8–23)
CO2: 26 mmol/L (ref 22–32)
Calcium: 7.3 mg/dL — ABNORMAL LOW (ref 8.9–10.3)
Chloride: 99 mmol/L (ref 98–111)
Creatinine, Ser: 0.98 mg/dL (ref 0.61–1.24)
GFR, Estimated: >60 mL/min (ref >60)
Glucose, Bld: 88 mg/dL (ref 70–99)
Potassium: 3.6 mmol/L (ref 3.5–5.1)
Sodium: 129 mmol/L — ABNORMAL LOW (ref 135–145)

## 2023-06-07 LAB — HEPATIC FUNCTION PANEL
ALT: 53 U/L — ABNORMAL HIGH (ref 0–44)
AST: 33 U/L (ref 15–41)
Albumin: 2.5 g/dL — ABNORMAL LOW (ref 3.5–5.0)
Alkaline Phosphatase: 95 U/L (ref 38–126)
Bilirubin, Direct: 0.8 mg/dL — ABNORMAL HIGH (ref 0.0–0.2)
Indirect Bilirubin: 1.3 mg/dL — ABNORMAL HIGH (ref 0.3–0.9)
Total Bilirubin: 2.1 mg/dL — ABNORMAL HIGH (ref 0.0–1.2)
Total Protein: 5.1 g/dL — ABNORMAL LOW (ref 6.5–8.1)

## 2023-06-07 LAB — CBC
HCT: 28.5 % — ABNORMAL LOW (ref 39.0–52.0)
Hemoglobin: 9.3 g/dL — ABNORMAL LOW (ref 13.0–17.0)
MCH: 29.3 pg (ref 26.0–34.0)
MCHC: 32.6 g/dL (ref 30.0–36.0)
MCV: 89.9 fL (ref 80.0–100.0)
Platelets: 112 10*3/uL — ABNORMAL LOW (ref 150–400)
RBC: 3.17 MIL/uL — ABNORMAL LOW (ref 4.22–5.81)
RDW: 15.9 % — ABNORMAL HIGH (ref 11.5–15.5)
WBC: 2.7 10*3/uL — ABNORMAL LOW (ref 4.0–10.5)
nRBC: 2.6 % — ABNORMAL HIGH (ref 0.0–0.2)

## 2023-06-07 LAB — HIV ANTIBODY (ROUTINE TESTING W REFLEX): HIV Screen 4th Generation wRfx: REACTIVE — AB

## 2023-06-07 LAB — LIPASE, BLOOD: Lipase: 25 U/L (ref 11–51)

## 2023-06-07 MED ORDER — SODIUM CHLORIDE 0.9 % IV SOLN
500.0000 mg | INTRAVENOUS | Status: DC
  Administered 2023-06-07 – 2023-06-08 (×3): 500 mg via INTRAVENOUS
  Filled 2023-06-07 (×3): qty 5

## 2023-06-07 MED ORDER — MAGNESIUM SULFATE 2 GM/50ML IV SOLN
2.0000 g | Freq: Once | INTRAVENOUS | Status: AC
  Administered 2023-06-07: 2 g via INTRAVENOUS
  Filled 2023-06-07: qty 50

## 2023-06-07 MED ORDER — SODIUM CHLORIDE 0.9 % IV SOLN
2.0000 g | INTRAVENOUS | Status: DC
  Administered 2023-06-07 – 2023-06-08 (×2): 2 g via INTRAVENOUS
  Filled 2023-06-07 (×2): qty 20

## 2023-06-07 MED ORDER — TECHNETIUM TC 99M MEBROFENIN IV KIT
7.8300 | PACK | Freq: Once | INTRAVENOUS | Status: AC
  Administered 2023-06-07: 7.83 via INTRAVENOUS

## 2023-06-07 MED ORDER — MORPHINE SULFATE (PF) 4 MG/ML IV SOLN
3.0000 mg | Freq: Once | INTRAVENOUS | Status: AC
  Administered 2023-06-07: 3 mg via INTRAVENOUS
  Filled 2023-06-07: qty 1

## 2023-06-07 NOTE — Telephone Encounter (Signed)
 Thank you :)

## 2023-06-07 NOTE — Plan of Care (Signed)

## 2023-06-07 NOTE — Progress Notes (Cosign Needed)
 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  Discontinued due to pulmonary toxicity and progression of disease. - Status post radiation therapy to right adrenal gland, completed February 2025. - Started on Gemcitabine +Navelbine , cycle 1 given 05/26/2023.  The plan when he was last seen in outpatient oncology is for 3 cycles of treatment and then repeat outpatient PET scan. - Medical oncology/Dr. Maria Shiner following  Abdominal and back pain Cholelithiasis Generalized weakness - Patient admitted with complaints of abdominal pain radiating to his back and increasing weakness - May be multifactorial due to malignancy and/or related to cholelithiasis.  Appreciate surgery eval. - Ultrasound and CT scan of abdomen done on 06/06/2023 shows cholelithiasis with slightly distended gallbladder. - HIDA and MRCP pending, will follow results. - Surgery team not recommending surgery at this time, recommend percutaneous drain due to patient's ongoing chemotherapy.  Patient and wife expressed today that they would prefer surgical intervention due to previous "bad experiences" with drains. -Continue pain meds and supportive care  Pancytopenia Leukopenia: WBC low 2.7.  No intervention required at this time. Anemia, normocytic: Hemoglobin 9.3 today.  No transfusional intervention required at this time. Thrombocytopenia: Platelets low 112K.  No transfusional intervention required at this time - Low counts likely due to recent chemotherapy and malignancy - Continue to monitor CBC with differential  A-fib - On Eliquis  at home, last dose was given 06/06/2023 AM dose.  This is currently on hold.    Code Status Full  Subjective:  Patient seen awake alert and oriented x 3 laying on  his right side in bed.  Reports that he does not feel well and continues to have abdominal pain, points to mid lower abdominal region with back pain.  Wife is at bedside.  Patient and wife report hesitation for drain as he has had "bad experience".  Would prefer to have gallbladder removed and have less pain.  Patient and wife aware that patient is awaiting further diagnostic testing.  No other acute complaints.  Objective:  Vitals:   06/07/23 0837 06/07/23 0925  BP:  94/66  Pulse:  82  Resp:  16  Temp:  98.7 F (37.1 C)  SpO2: 91% 97%     Intake/Output Summary (Last 24 hours) at 06/07/2023 1200 Last data filed at 06/07/2023 9811 Gross per 24 hour  Intake 3919.62 ml  Output 1650 ml  Net 2269.62 ml     PHYSICAL EXAMINATION: ECOG PERFORMANCE STATUS: 2 - Symptomatic, <50% confined to bed  Vitals:   06/07/23 0837 06/07/23 0925  BP:  94/66  Pulse:  82  Resp:  16  Temp:  98.7 F (37.1 C)  SpO2: 91% 97%   Filed Weights   06/06/23 0957  Weight: 250 lb (113.4 kg)    GENERAL: alert, + mild distress and comfortable SKIN: skin color, texture, turgor are normal, no rashes or significant lesions EYES: normal, conjunctiva are pink and non-injected, sclera clear OROPHARYNX: no exudate, no erythema and lips, buccal mucosa, and tongue normal  NECK: supple, thyroid  normal size, non-tender, without nodularity LYMPH: no palpable lymphadenopathy in the cervical, axillary or inguinal LUNGS: clear to auscultation and percussion with normal breathing effort HEART: regular rate & rhythm and no murmurs and no lower extremity edema ABDOMEN: abdomen soft, + abdominal distention  MUSCULOSKELETAL: no  cyanosis of digits and no clubbing  PSYCH: alert & oriented x 3 with fluent speech NEURO: no focal motor/sensory deficits   All questions were answered. The patient knows to call the clinic with any problems, questions or concerns.   The total time spent in the appointment was 40 minutes encounter  with patient including review of chart and various tests results, discussions about plan of care and coordination of care plan  Jacqualin Mate, NP 06/07/2023 12:00 PM    Labs Reviewed:  Lab Results  Component Value Date   WBC 2.7 (L) 06/07/2023   HGB 9.3 (L) 06/07/2023   HCT 28.5 (L) 06/07/2023   MCV 89.9 06/07/2023   PLT 112 (L) 06/07/2023   Recent Labs    04/29/23 0511 05/09/23 0836 06/02/23 0937 06/06/23 1002 06/06/23 1101 06/07/23 0332  NA  --    < > 137 127* 130* 129*  K  --    < > 4.4 3.4* 3.5 3.6  CL  --    < > 101 95* 96* 99  CO2  --    < > 27 23  --  26  GLUCOSE  --    < > 116* 156* 138* 88  BUN  --    < > 11 14 12 10   CREATININE  --    < > 1.15 1.12 1.30* 0.98  CALCIUM   --    < > 9.4 8.2*  --  7.3*  GFRNONAA  --    < > >60 >60  --  >60  PROT 6.5   < > 6.8 6.4*  --  5.1*  ALBUMIN  3.2*   < > 3.6 3.0*  --  2.5*  AST 35   < > 181* 61*  --  33  ALT 28   < > 127* 87*  --  53*  ALKPHOS 53   < > 102 142*  --  95  BILITOT 1.1   < > 1.2 4.7*  --  2.1*  BILIDIR 0.3*  --   --  1.8*  --  0.8*  IBILI 0.8  --   --  2.9*  --  1.3*   < > = values in this interval not displayed.    Studies Reviewed:  US  Abdomen Limited RUQ (LIVER/GB) Result Date: 06/06/2023 CLINICAL DATA:  Emesis EXAM: ULTRASOUND ABDOMEN LIMITED RIGHT UPPER QUADRANT COMPARISON:  CT 06/06/2023 FINDINGS: Gallbladder: Slightly distended gallbladder with shadowing stone at the neck. Slight increased wall thickness at 3.8 mm but no sonographic Murphy. Common bile duct: Diameter: 4.6 mm Liver: Echogenic liver parenchyma. Poorly visible due to habitus and bowel gas. Portal vein is patent on color Doppler imaging with normal direction of blood flow towards the liver. Other: None. IMPRESSION: 1. Cholelithiasis with slightly distended gallbladder and slight increased wall thickness but no sonographic Murphy. If there is high clinical concern for acute cholecystitis, consider correlation with nuclear medicine hepatobiliary  imaging 2. Echogenic liver parenchyma consistent with hepatic steatosis and or hepatocellular disease. Electronically Signed   By: Esmeralda Hedge M.D.   On: 06/06/2023 20:18   CT Angio Chest/Abd/Pel for Dissection W and/or Wo Contrast Result Date: 06/06/2023 CLINICAL DATA:  Acute aortic syndrome suspected. Increased white blood cell count. EXAM: CT ANGIOGRAPHY CHEST, ABDOMEN AND PELVIS TECHNIQUE: Non-contrast CT of the chest was initially obtained. Multidetector CT imaging through the chest, abdomen and pelvis was performed using the standard protocol during bolus administration of intravenous contrast. Multiplanar reconstructed images and MIPs were obtained and  reviewed to evaluate the vascular anatomy. RADIATION DOSE REDUCTION: This exam was performed according to the departmental dose-optimization program which includes automated exposure control, adjustment of the mA and/or kV according to patient size and/or use of iterative reconstruction technique. CONTRAST:  OMNIPAQUE  IOHEXOL  350 MG/ML SOLN COMPARISON:  CT chest abdomen and pelvis 04/29/2023. FINDINGS: CTA CHEST FINDINGS Cardiovascular: Preferential opacification of the thoracic aorta. No evidence of thoracic aortic aneurysm or dissection. Normal heart size. No pericardial effusion. There are atherosclerotic calcifications of the aorta. Mediastinum/Nodes: No enlarged mediastinal, hilar, or axillary lymph nodes. Thyroid  gland, trachea, and esophagus demonstrate no significant findings. Lungs/Pleura: Moderate emphysematous changes are again seen. Fibrotic changes in the lower lung predominance appear similar to the prior study. Patchy ground-glass opacities in the inferior right lower lobe and airspace consolidation in the superior segment of the right lower lobe and adjacent right upper lobe are again seen. Right upper lobe airspace consolidation has mildly increased. There is no pneumothorax or pleural effusion. Musculoskeletal: Healed rib fractures  are again noted. No acute fractures are seen. T12 vertebral plasty changes are chronic fresh fracture deformity of T11 is unchanged. Review of the MIP images confirms the above findings. CTA ABDOMEN AND PELVIS FINDINGS VASCULAR Aorta: Infrarenal abdominal aortic aneurysm measuring 3.4 cm appears unchanged. There are atherosclerotic calcifications of the aorta. There is no dissection or inflammation. Celiac: Patent without evidence of aneurysm, dissection, vasculitis or significant stenosis. SMA: Patent without evidence of aneurysm, dissection, vasculitis or significant stenosis. Renals: Both renal arteries are patent without evidence of aneurysm, dissection, vasculitis, fibromuscular dysplasia or significant stenosis. IMA: Patent without evidence of aneurysm, dissection, vasculitis or significant stenosis. Inflow: Patent without evidence of aneurysm, dissection, vasculitis or significant stenosis. Calcified atherosclerotic disease is present. Veins: No obvious venous abnormality within the limitations of this arterial phase study. Review of the MIP images confirms the above findings. NON-VASCULAR Hepatobiliary: Gallbladder is distended. There is a gallstone in the gallbladder bladder neck measuring 16 mm. There is no pericholecystic inflammation. No focal liver lesions are identified. There is no biliary ductal dilatation. Pancreas: Unremarkable. No pancreatic ductal dilatation or surrounding inflammatory changes. Spleen: The spleen is mildly enlarged. Adrenals/Urinary Tract: Indeterminate right adrenal nodule measuring 2.6 cm is unchanged. Left adrenal gland is within normal limits. Left renal cyst measuring 9 mm is unchanged. There is no hydronephrosis in either kidney. The bladder is within normal limits. Stomach/Bowel: Stomach is within normal limits. Appendix appears normal. No evidence of bowel wall thickening, distention, or inflammatory changes. Lymphatic: No enlarged lymph nodes are seen. Reproductive:  Prostate gland is within normal limits. Other: There are small fat containing left inguinal and umbilical hernias. There is no ascites. Musculoskeletal: L1 and L3 vertebroplasty changes are present. Review of the MIP images confirms the above findings. IMPRESSION: 1. No evidence for thoracic aortic dissection or aneurysm. 2. Stable 3.4 cm infrarenal abdominal aortic aneurysm. Recommend follow-up ultrasound every 3 years. 3. Stable emphysema and fibrotic changes. 4. Mild increase in airspace consolidation in the right upper lobe worrisome for worsening pneumonia. 5. Cholelithiasis with distended gallbladder. No pericholecystic inflammation. 6. Stable indeterminate right adrenal nodule. 7. Mild splenomegaly. Aortic Atherosclerosis (ICD10-I70.0) and Emphysema (ICD10-J43.9). Electronically Signed   By: Tyron Gallon M.D.   On: 06/06/2023 16:12   DG Chest Port 1 View Result Date: 06/06/2023 CLINICAL DATA:  63 year old male with possible sepsis. Metastatic lung cancer. Fall yesterday. EXAM: PORTABLE CHEST 1 VIEW COMPARISON:  Restaging CT Chest, Abdomen, and Pelvis today are reported separately. 04/29/2023.  FINDINGS: Portable AP semi upright view at 1024 hours. Chronically low lung volumes, slightly lower when compared to 04/29/2023 radiographs today. Stable mediastinal contours with right hilar architectural distortion. Combined right lung fibrosis and emphysema demonstrated by CT last month. No superimposed pneumothorax, pulmonary edema, pleural effusion, acute lung opacity. Visualized tracheal air column is within normal limits. Stable visualized osseous structures. Paucity of bowel gas. IMPRESSION: No acute cardiopulmonary abnormality; low lung volumes with underlying emphysema and post treatment right lung fibrosis. Electronically Signed   By: Marlise Simpers M.D.   On: 06/06/2023 12:41   MR Lumbar Spine W Wo Contrast Result Date: 06/06/2023 CLINICAL DATA:  63 year old male with stage IV lung cancer. Fall yesterday. Low  back pain. EXAM: MRI LUMBAR SPINE WITHOUT AND WITH CONTRAST TECHNIQUE: Multiplanar and multiecho pulse sequences of the lumbar spine were obtained without and with intravenous contrast. CONTRAST:  10mL GADAVIST  GADOBUTROL  1 MMOL/ML IV SOLN COMPARISON:  Lumbar MRI 08/20/2021.  Lumbar spine CT 10/02/2021. FINDINGS: Segmentation:  Normal on the comparison CT. Alignment: Lumbar lordosis not significantly changed from the 2023 CT. Vertebrae: Chronic, and several previously augmented, compression fractures T11 through L5. The T11 compression is new since 10/02/2021, but chronic with absent marrow edema. The other levels appear stable since 10/02/2021. Background bone marrow signal is within normal limits. Visible sacrum and SI joints are intact. No convincing marrow edema or acute osseous abnormality. Conus medullaris and cauda equina: Conus extends to the T11 level. Conu No lower spinal cord or conus signal abnormality. No abnormal intradural enhancement. No dural thickening. Paraspinal and other soft tissues: Infrarenal abdominal aortic aneurysm, approximately 36 mm diameter now on series 9, image 12. This compares to 34 mm on 10/02/2021. Negative visualized posterior paraspinal soft tissues. Disc levels: Spinal stenosis throughout the lumbar spine related to disc bulging and mild retropulsion. This is mild to moderate at most levels. But moderate to severe multifactorial at the L4-L5 level. IMPRESSION: 1. Diffuse lower thoracic and lumbar compression fractures are chronic and appear likely to be benign. No acute or metastatic process identified in the lumbar spine. 2. Diffuse multifactorial lumbar spinal stenosis, moderate to severe at L4-L5. 3. Abdominal aortic aneurysm measuring 3.6 cm. Recommend surveillance ultrasound in 3 years. Reference: Journal of Vascular Surgery 67.1 (2018): 2-77. J Am Coll Radiol [phone]. Electronically Signed   By: Marlise Simpers M.D.   On: 06/06/2023 12:39   ADDENDUM: I saw and examined  Mr. Strollo this morning.  He is well-known to me.  Clearly, the problem now is his gallbladder.  The HIDA scan shows an he has a gallbladder that does not work.  From a oncologic point of view, his cancer is NOT an issue in the short-term.  He easily has over a year of survival in my opinion.  His quality of life clearly will be dictated by the gallbladder right now.  He really needs to have the gallbladder taken out.  I would NOT favor a cholecystotomy tube.  This would really hinder his quality of life.  I do not worry about his blood counts.  We can certainly work on getting his blood counts adequate for surgery.  He has significant cardiac issues.  I would think that cardiology would have to weigh in on any upcoming surgery.  I would hope that he would have the gallbladder taken out.  Again, I would not think that he would be at a much higher risk for recovery.  He is very active.  Again, having a cholecystotomy  drainage catheter in him would really hinder his quality of life and would really make life a lot more complicated for him.  We will follow along.  I do appreciate everybody's help.   Rayleen Cal, MD  Lina Render 17:14

## 2023-06-07 NOTE — Progress Notes (Signed)
 OT Cancellation Note  Patient Details Name: John [name] MRN: [id number] DOB: Nov 05, 1960   Cancelled Treatment:    Reason Eval/Treat Not Completed: Pain limiting ability to participate OT attempted initial eval. Patient reports he has not been able to have pain meds in prep for scan and deferring mobility until pain meds allowed. OT will re-attempt initial eval when patient able and OT schedule allows.   Shaida Route OT/L Acute Rehabilitation Department  [phone]  06/07/2023, 3:20 PM

## 2023-06-07 NOTE — Progress Notes (Signed)
 PT Cancellation Note  Patient Details Name: John [name] MRN: [id number] DOB: [birth year]   Cancelled Treatment:    Reason Eval/Treat Not Completed: Pain limiting ability to participate  Patient reports he has not been able to have pain meds in prep for scan and deferring mobility until pain meds allowed. PT will re-attempt initial eval when patient able and PT schedule allows.   [facility] 06/07/2023, 3:32 PM

## 2023-06-07 NOTE — TOC Initial Note (Signed)
 Transition of Care [facility]) - Initial/Assessment Note    Patient Details  Name: John [name] MRN: [id number] Date of Birth: 07-Oct-1960  [facility] CM/SW Contact:    Bari Leys, RN Phone Number: 06/07/2023, 1:49 PM  Clinical Narrative:   Met with patient and spouse at bedside to introduce role of TOC/NCM and review for dc planning, pt reports he has an established PCP and pharmacy, no current home care services, home DME: 02 thru [employer], walker, pt reports he resides with his spouse and feels safe returning home. PT eval, await recommendation. TOC will continue to follow.                 Expected Discharge Plan: Home w Home Health Services Barriers to Discharge: Continued Medical Work up   Patient Goals and CMS Choice Patient states their goals for this hospitalization and ongoing recovery are:: return home          Expected Discharge Plan and Services       Living arrangements for the past 2 months: Single Family Home                                      Prior Living Arrangements/Services Living arrangements for the past 2 months: Single Family Home Lives with:: Spouse Patient language and need for interpreter reviewed:: Yes Do you feel safe going back to the place where you live?: Yes      Need for Family Participation in Patient Care: Yes (Comment) Care giver support system in place?: Yes (comment) Current home services: DME (02-[employer], walker,) Criminal Activity/Legal Involvement Pertinent to Current Situation/Hospitalization: No - Comment as needed  Activities of Daily Living   ADL Screening (condition at time of admission) Independently performs ADLs?: No Does the patient have a NEW difficulty with bathing/dressing/toileting/self-feeding that is expected to last >3 days?: No Does the patient have a NEW difficulty with getting in/out of bed, walking, or climbing stairs that is expected to last >3 days?: No Does the patient have  a NEW difficulty with communication that is expected to last >3 days?: No Is the patient deaf or have difficulty hearing?: No Does the patient have difficulty seeing, even when wearing glasses/contacts?: No Does the patient have difficulty concentrating, remembering, or making decisions?: No  Permission Sought/Granted                  Emotional Assessment Appearance:: Appears stated age Attitude/Demeanor/Rapport: Engaged Affect (typically observed): Accepting Orientation: : Oriented to Self, Oriented to Place, Oriented to  Time, Oriented to Situation Alcohol / Substance Use: Not Applicable Psych Involvement: No (comment)  Admission diagnosis:  Hyperbilirubinemia [E80.6] Back pain [M54.9] Acute on chronic respiratory failure with hypoxia (HCC) [J96.21] Pneumonia of right upper lobe due to infectious organism [J18.9] Patient Active Problem List   Diagnosis Date Noted   Back pain 06/06/2023   Encounter for monitoring amiodarone  therapy 04/15/2023   Atrial fibrillation (HCC) 03/31/2023   Hyperthyroidism 03/31/2023   Right middle lobe pneumonia 05/31/2022   GERD (gastroesophageal reflux disease) 12/07/2021   T12 compression fracture (HCC) 08/23/2021   Mixed hyperlipidemia 08/02/2021   Coronary artery disease involving native coronary artery of native heart without angina pectoris 08/02/2021   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) 07/29/2021   Obesity 07/27/2021   Chronic respiratory failure with hypoxia (HCC) 04/14/2021   Drug-induced pneumonitis 12/23/2020   Hypercoagulable  state due to persistent atrial fibrillation (HCC) 10/28/2020   Malignant neoplasm of lung (HCC)    RA (rheumatoid arthritis) (HCC) 10/06/2020   Persistent atrial fibrillation (HCC) 08/25/2020   Drug-induced neutropenia (HCC) 08/11/2020   Occlusion of right pulmonary artery (HCC) 05/12/2020   Neutropenia (HCC) 05/12/2020   Stage 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 (HCC)  04/10/2020   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 [address] [facility] [address] [place] [address] Phone: [phone] Fax: [phone]  [facility] [address] [place] [address] Phone: [phone] Fax: [phone]     Social Drivers of Health (SDOH) Social History: SDOH Screenings   Food Insecurity: No Food Insecurity (06/06/2023)  Housing: Low Risk  (06/06/2023)  Transportation Needs: No Transportation Needs (06/06/2023)  Utilities: Not At Risk (06/06/2023)  Depression (PHQ2-9): Low Risk  (04/12/2023)  Social Connections: Moderately Integrated (03/16/2022)  Tobacco Use: Medium Risk (06/06/2023)   SDOH Interventions:     Readmission Risk Interventions    06/07/2023    1:47 PM 08/06/2021    2:26 PM  Readmission Risk Prevention Plan  Transportation Screening Complete Complete  PCP or Specialist Appt within 3-5 Days Complete   HRI or Home Care Consult Complete   Social Work Consult for Recovery Care Planning/Counseling Complete   Palliative Care Screening Not Applicable   Medication Review Oceanographer) Complete   HRI or Home Care Consult  Complete  SW Recovery Care/Counseling Consult  Complete  Skilled Nursing Facility  Not Applicable

## 2023-06-07 NOTE — Progress Notes (Signed)
 PT Cancellation Note  Patient Details Name: John [name] MRN: [id number] DOB: June 03, 1960   Cancelled Treatment:    Reason Eval/Treat Not Completed: Other (comment); attempted to see pt this morning, NP came into room. PT left and will continue efforts to complete PT eval    [facility] 06/07/2023, 12:19 PM

## 2023-06-07 NOTE — Progress Notes (Addendum)
 PROGRESS NOTE    John [name]  LKG:[id number] DOB: 1961-01-28 DOA: 06/06/2023 PCP: Dorthy Gavia, MD    Brief Narrative:   John [name] is a 63 y.o. 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  He has had frequent visits in the ED on account of abdominal pain with associated nausea and vomiting.  Workup of  gallbladder in progress.   Assessment and Plan: Abdominal pain radiating to his back:  -CT scan did reveal findings suggesting cholelithiasis with distended gallbladder.  He has elevated AST, ALT and total bilirubin. -GI/GS consulted -HIDA pending   ? PNA-- seen on CT scan -IV abx and monitor  Chronic back pain: Compression fractures found in T10-L5 with cement augmentation.  This was seen on prior CT scan.   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  H/o RA -on chronic steroids  DVT prophylaxis: SCDs Start: 06/06/23 2207    Code Status: Full Code Family Communication:   Disposition Plan:  Level of care: Med-Surg Status is: Inpatient   Consultants:  GI GS   Subjective: Afraid to eat due to pain  Objective: Vitals:   06/07/23 0134 06/07/23 0627 06/07/23 0837 06/07/23 0925  BP: (!) 91/58 109/72  94/66  Pulse: 83 85  82  Resp: 18 18  16   Temp: 98.4 F (36.9 C) 98.5 F (36.9 C)  98.7 F (37.1 C)  TempSrc: Oral Oral  Oral  SpO2: 96% 95% 91% 97%  Weight:      Height:        Intake/Output Summary (Last 24 hours) at 06/07/2023 1309 Last data filed at 06/07/2023 6644 Gross per 24 hour  Intake 2919.62 ml  Output 1650 ml  Net 1269.62 ml   Filed Weights   06/06/23 0957  Weight: 113.4 kg    Examination:   General: Appearance:    Obese male in no acute distress      Lungs:      respirations unlabored  Heart:    Normal heart rate.   MS:   All extremities are intact.    Neurologic:   Awake, alert       Data Reviewed: I have personally reviewed following labs and imaging studies  CBC: Recent Labs  Lab 06/02/23 0937 06/06/23 1002 06/06/23 1101 06/07/23 0332  WBC 0.9* 2.6*  --  2.7*  NEUTROABS 0.1*  --   --   --   HGB 12.5* 11.9* 10.5* 9.3*  HCT 36.7* 35.6* 31.0* 28.5*  MCV 85.2 86.6  --  89.9  PLT 104* 157  --  112*   Basic Metabolic Panel: Recent Labs  Lab 06/02/23 0937 06/06/23 1002 06/06/23 1101 06/07/23 0332  NA 137 127* 130* 129*  K 4.4 3.4* 3.5 3.6  CL 101 95* 96* 99  CO2 27 23  --  26  GLUCOSE 116* 156* 138* 88  BUN 11 14 12 10   CREATININE 1.15 1.12 1.30* 0.98  CALCIUM  9.4 8.2*  --  7.3*  MG  --  1.8  --   --    GFR: Estimated Creatinine Clearance: 101.3 mL/min (by C-G formula based on SCr of 0.98 mg/dL). Liver Function Tests: Recent Labs  Lab 06/02/23 0937 06/06/23 1002 06/07/23 0332  AST 181* 61* 33  ALT 127* 87* 53*  ALKPHOS 102  142* 95  BILITOT 1.2 4.7* 2.1*  PROT 6.8 6.4* 5.1*  ALBUMIN  3.6 3.0* 2.5*   Recent Labs  Lab 06/07/23 0332  LIPASE 25   No results for input(s): "AMMONIA" in the last 168 hours. Coagulation Profile: Recent Labs  Lab 06/06/23 1002  INR 1.6*   Cardiac Enzymes: No results for input(s): "CKTOTAL", "CKMB", "CKMBINDEX", "TROPONINI" in the last 168 hours. BNP (last 3 results) No results for input(s): "PROBNP" in the last 8760 hours. HbA1C: No results for input(s): "HGBA1C" in the last 72 hours. CBG: Recent Labs  Lab 06/06/23 1046  GLUCAP 132*   Lipid Profile: No results for input(s): "CHOL", "HDL", "LDLCALC", "TRIG", "CHOLHDL", "LDLDIRECT" in the last 72 hours. Thyroid  Function Tests: No results for input(s): "TSH", "T4TOTAL", "FREET4", "T3FREE", "THYROIDAB" in the last 72 hours. Anemia Panel: No results for input(s): "VITAMINB12", "FOLATE", "FERRITIN", "TIBC",  "IRON", "RETICCTPCT" in the last 72 hours. Sepsis Labs: Recent Labs  Lab 06/06/23 1101  LATICACIDVEN 1.9    Recent Results (from the past 240 hours)  Blood Culture (routine x 2)     Status: None (Preliminary result)   Collection Time: 06/06/23 11:58 AM   Specimen: BLOOD  Result Value Ref Range Status   Specimen Description   Final    BLOOD RIGHT ANTECUBITAL Performed at [facility], [address], [place], [place] [address]    Special Requests   Final    BOTTLES DRAWN AEROBIC AND ANAEROBIC Blood Culture adequate volume Performed at [facility], [address] [place], [place] [address]    Culture   Final    NO GROWTH < 24 HOURS Performed at [facility], [address] [place], [place] [address]    Report Status PENDING  Incomplete  Blood Culture (routine x 2)     Status: None (Preliminary result)   Collection Time: 06/06/23 12:12 PM   Specimen: BLOOD  Result Value Ref Range Status   Specimen Description   Final    BLOOD BLOOD LEFT HAND Performed at [facility], [address], [place], [place] [address]    Special Requests   Final    BOTTLES DRAWN AEROBIC AND ANAEROBIC Blood Culture adequate volume Performed at [facility], [address], [place], [place] [address]    Culture   Final    NO GROWTH < 24 HOURS Performed at [facility], [address], [place], [place] [address]    Report Status PENDING  Incomplete         Radiology Studies: US  Abdomen Limited RUQ (LIVER/GB) Result Date: 06/06/2023 CLINICAL DATA:  Emesis EXAM: ULTRASOUND ABDOMEN LIMITED RIGHT UPPER QUADRANT COMPARISON:  CT 06/06/2023 FINDINGS: Gallbladder: Slightly distended gallbladder with shadowing stone at the neck. Slight increased wall thickness at 3.8 mm but no sonographic Murphy. Common bile duct: Diameter: 4.6 mm Liver: Echogenic liver parenchyma. Poorly visible due to habitus and bowel gas. Portal  vein is patent on color Doppler imaging with normal direction of blood flow towards the liver. Other: None. IMPRESSION: 1. Cholelithiasis with slightly distended gallbladder and slight increased wall thickness but no sonographic Murphy. If there is high clinical concern for acute cholecystitis, consider correlation with nuclear medicine hepatobiliary imaging 2. Echogenic liver parenchyma consistent with hepatic steatosis and or hepatocellular disease. Electronically Signed   By: Esmeralda Hedge M.D.   On: 06/06/2023 20:18   CT Angio Chest/Abd/Pel for Dissection W and/or Wo Contrast Result Date: 06/06/2023 CLINICAL DATA:  Acute  aortic syndrome suspected. Increased white blood cell count. EXAM: CT ANGIOGRAPHY CHEST, ABDOMEN AND PELVIS TECHNIQUE: Non-contrast CT of the chest was initially obtained. Multidetector CT imaging through the chest, abdomen and pelvis was performed using the standard protocol during bolus administration of intravenous contrast. Multiplanar reconstructed images and MIPs were obtained and reviewed to evaluate the vascular anatomy. RADIATION DOSE REDUCTION: This exam was performed according to the departmental dose-optimization program which includes automated exposure control, adjustment of the mA and/or kV according to patient size and/or use of iterative reconstruction technique. CONTRAST:  OMNIPAQUE  IOHEXOL  350 MG/ML SOLN COMPARISON:  CT chest abdomen and pelvis 04/29/2023. FINDINGS: CTA CHEST FINDINGS Cardiovascular: Preferential opacification of the thoracic aorta. No evidence of thoracic aortic aneurysm or dissection. Normal heart size. No pericardial effusion. There are atherosclerotic calcifications of the aorta. Mediastinum/Nodes: No enlarged mediastinal, hilar, or axillary lymph nodes. Thyroid  gland, trachea, and esophagus demonstrate no significant findings. Lungs/Pleura: Moderate emphysematous changes are again seen. Fibrotic changes in the lower lung predominance appear  similar to the prior study. Patchy ground-glass opacities in the inferior right lower lobe and airspace consolidation in the superior segment of the right lower lobe and adjacent right upper lobe are again seen. Right upper lobe airspace consolidation has mildly increased. There is no pneumothorax or pleural effusion. Musculoskeletal: Healed rib fractures are again noted. No acute fractures are seen. T12 vertebral plasty changes are chronic fresh fracture deformity of T11 is unchanged. Review of the MIP images confirms the above findings. CTA ABDOMEN AND PELVIS FINDINGS VASCULAR Aorta: Infrarenal abdominal aortic aneurysm measuring 3.4 cm appears unchanged. There are atherosclerotic calcifications of the aorta. There is no dissection or inflammation. Celiac: Patent without evidence of aneurysm, dissection, vasculitis or significant stenosis. SMA: Patent without evidence of aneurysm, dissection, vasculitis or significant stenosis. Renals: Both renal arteries are patent without evidence of aneurysm, dissection, vasculitis, fibromuscular dysplasia or significant stenosis. IMA: Patent without evidence of aneurysm, dissection, vasculitis or significant stenosis. Inflow: Patent without evidence of aneurysm, dissection, vasculitis or significant stenosis. Calcified atherosclerotic disease is present. Veins: No obvious venous abnormality within the limitations of this arterial phase study. Review of the MIP images confirms the above findings. NON-VASCULAR Hepatobiliary: Gallbladder is distended. There is a gallstone in the gallbladder bladder neck measuring 16 mm. There is no pericholecystic inflammation. No focal liver lesions are identified. There is no biliary ductal dilatation. Pancreas: Unremarkable. No pancreatic ductal dilatation or surrounding inflammatory changes. Spleen: The spleen is mildly enlarged. Adrenals/Urinary Tract: Indeterminate right adrenal nodule measuring 2.6 cm is unchanged. Left adrenal gland is  within normal limits. Left renal cyst measuring 9 mm is unchanged. There is no hydronephrosis in either kidney. The bladder is within normal limits. Stomach/Bowel: Stomach is within normal limits. Appendix appears normal. No evidence of bowel wall thickening, distention, or inflammatory changes. Lymphatic: No enlarged lymph nodes are seen. Reproductive: Prostate gland is within normal limits. Other: There are small fat containing left inguinal and umbilical hernias. There is no ascites. Musculoskeletal: L1 and L3 vertebroplasty changes are present. Review of the MIP images confirms the above findings. IMPRESSION: 1. No evidence for thoracic aortic dissection or aneurysm. 2. Stable 3.4 cm infrarenal abdominal aortic aneurysm. Recommend follow-up ultrasound every 3 years. 3. Stable emphysema and fibrotic changes. 4. Mild increase in airspace consolidation in the right upper lobe worrisome for worsening pneumonia. 5. Cholelithiasis with distended gallbladder. No pericholecystic inflammation. 6. Stable indeterminate right adrenal nodule. 7. Mild splenomegaly. Aortic Atherosclerosis (ICD10-I70.0) and Emphysema (ICD10-J43.9). Electronically Signed  By: Tyron Gallon M.D.   On: 06/06/2023 16:12   DG Chest Port 1 View Result Date: 06/06/2023 CLINICAL DATA:  63 year old male with possible sepsis. Metastatic lung cancer. Fall yesterday. EXAM: PORTABLE CHEST 1 VIEW COMPARISON:  Restaging CT Chest, Abdomen, and Pelvis today are reported separately. 04/29/2023. FINDINGS: Portable AP semi upright view at 1024 hours. Chronically low lung volumes, slightly lower when compared to 04/29/2023 radiographs today. Stable mediastinal contours with right hilar architectural distortion. Combined right lung fibrosis and emphysema demonstrated by CT last month. No superimposed pneumothorax, pulmonary edema, pleural effusion, acute lung opacity. Visualized tracheal air column is within normal limits. Stable visualized osseous structures.  Paucity of bowel gas. IMPRESSION: No acute cardiopulmonary abnormality; low lung volumes with underlying emphysema and post treatment right lung fibrosis. Electronically Signed   By: Marlise Simpers M.D.   On: 06/06/2023 12:41   MR Lumbar Spine W Wo Contrast Result Date: 06/06/2023 CLINICAL DATA:  63 year old male with stage IV lung cancer. Fall yesterday. Low back pain. EXAM: MRI LUMBAR SPINE WITHOUT AND WITH CONTRAST TECHNIQUE: Multiplanar and multiecho pulse sequences of the lumbar spine were obtained without and with intravenous contrast. CONTRAST:  10mL GADAVIST  GADOBUTROL  1 MMOL/ML IV SOLN COMPARISON:  Lumbar MRI 08/20/2021.  Lumbar spine CT 10/02/2021. FINDINGS: Segmentation:  Normal on the comparison CT. Alignment: Lumbar lordosis not significantly changed from the 2023 CT. Vertebrae: Chronic, and several previously augmented, compression fractures T11 through L5. The T11 compression is new since 10/02/2021, but chronic with absent marrow edema. The other levels appear stable since 10/02/2021. Background bone marrow signal is within normal limits. Visible sacrum and SI joints are intact. No convincing marrow edema or acute osseous abnormality. Conus medullaris and cauda equina: Conus extends to the T11 level. Conu No lower spinal cord or conus signal abnormality. No abnormal intradural enhancement. No dural thickening. Paraspinal and other soft tissues: Infrarenal abdominal aortic aneurysm, approximately 36 mm diameter now on series 9, image 12. This compares to 34 mm on 10/02/2021. Negative visualized posterior paraspinal soft tissues. Disc levels: Spinal stenosis throughout the lumbar spine related to disc bulging and mild retropulsion. This is mild to moderate at most levels. But moderate to severe multifactorial at the L4-L5 level. IMPRESSION: 1. Diffuse lower thoracic and lumbar compression fractures are chronic and appear likely to be benign. No acute or metastatic process identified in the lumbar spine. 2.  Diffuse multifactorial lumbar spinal stenosis, moderate to severe at L4-L5. 3. Abdominal aortic aneurysm measuring 3.6 cm. Recommend surveillance ultrasound in 3 years. Reference: Journal of Vascular Surgery 67.1 (2018): 2-77. J Am Coll Radiol [phone]. Electronically Signed   By: Marlise Simpers M.D.   On: 06/06/2023 12:39        Scheduled Meds:  amiodarone   200 mg Oral Daily   budeson-glycopyrrolate -formoterol   2 puff Inhalation BID   fentaNYL   1 patch Transdermal Q72H   methimazole   5 mg Oral Daily   metoprolol  succinate  25 mg Oral Daily   PARoxetine   10 mg Oral QHS   predniSONE   5 mg Oral Q breakfast   sodium chloride  flush  3 mL Intravenous Q12H   Continuous Infusions:  sodium chloride  75 mL/hr at 06/07/23 8119   cefTRIAXone  (ROCEPHIN )  IV 2 g (06/07/23 1225)     LOS: 1 day    Time spent: 45 minutes spent on chart review, discussion with nursing staff, consultants, updating family and interview/physical exam; more than 50% of that time was spent in counseling and/or coordination of  care.    Enrigue Harvard, DO [facility] Available via Epic secure chat 7am-7pm After these hours, please refer to coverage provider listed on [email or web address] 06/07/2023, 1:09 PM

## 2023-06-07 NOTE — Consult Note (Signed)
 Reason for Consult: Abnormal liver tests gall stones Referring Physician: Hospital team  John [name] is an 63 y.o. male.  HPI: Patient seen and examined and his hospital computer chart reviewed and his case discussed with his wife and he has not seen a gastroenterologist before and has not had much GI issues until a month or 2 ago he began having pain mostly on the right side but was not told he had gallstones despite it showing up on multiple scans and liver tests were initially normal with increased pain his liver test bumped up however they are a little lower today but he is not really feeling any better and he has pancytopenia from chemotherapy and we discussed the difference between the MRI and the HIDA scan and talked about cholecystectomy versus ERCP versus percutaneous drain and answered all of their questions  Past Medical History:  Diagnosis Date   Arthritis    Rheumatoid   Bacteremia due to Pseudomonas    C. difficile colitis    Chronic anxiety 11/08/2014   Cough 09/22/2021   COVID 2020   mild case   Depression    Drug-induced neutropenia (HCC) 08/11/2020   Family history of adverse reaction to anesthesia    brother with seizures had episode under anesthesia.  Patient has seizures as well.   GERD (gastroesophageal reflux disease)    History of radiation therapy 04/24/2020-05/16/2020   IMRT to right lung     Dr Retta Caster   History of radiation therapy    08/10/21-08/19/21-Dr. Retta Caster   History of radiation therapy    Abdomen- 03/21/23-03/31/23-Dr. Retta Caster   Neutropenia [facility]) 05/12/2020   Neutropenic fever (HCC) 05/12/2020   Normocytic anemia 04/18/2021   PAF (paroxysmal atrial fibrillation) (HCC)    CHADS2VSAC score 3   Rheumatoid aortitis    Secondary hypercoagulable state (HCC) 10/28/2020   Sepsis (HCC) 10/06/2020   Shortness of breath 12/18/2020   Squamous cell lung cancer [facility])     Past Surgical History:  Procedure Laterality Date   BRONCHIAL  BRUSHINGS  03/27/2020   Procedure: BRONCHIAL BRUSHINGS;  Surgeon: Denson Flake, MD;  Location: [facility] ENDOSCOPY;  Service: Cardiopulmonary;;   BUBBLE STUDY  10/13/2020   Procedure: BUBBLE STUDY;  Surgeon: Hazle Lites, MD;  Location: [facility] ENDOSCOPY;  Service: Cardiovascular;;   CARDIOVERSION N/A 10/13/2020   Procedure: CARDIOVERSION;  Surgeon: Hazle Lites, MD;  Location: [facility] ENDOSCOPY;  Service: Cardiovascular;  Laterality: N/A;   CATARACT EXTRACTION  2016   at [facility]   FINE NEEDLE ASPIRATION  03/27/2020   Procedure: FINE NEEDLE ASPIRATION;  Surgeon: Denson Flake, MD;  Location: [facility] ENDOSCOPY;  Service: Cardiopulmonary;;   HIP SURGERY Left    IR KYPHO EA ADDL LEVEL THORACIC OR LUMBAR  10/12/2021   IR KYPHO LUMBAR INC FX REDUCE BONE BX UNI/BIL CANNULATION INC/IMAGING  10/12/2021   IR KYPHO THORACIC WITH BONE BIOPSY  09/07/2021   IR RADIOLOGIST EVAL & MGMT  09/03/2021   RADIOLOGY WITH ANESTHESIA N/A 10/12/2021   Procedure: L1 and L3 Kyphoplasty;  Surgeon: de Macedo Rodrigues, Katyucia, MD;  Location: [facility] OR;  Service: Radiology;  Laterality: N/A;   TEE WITHOUT CARDIOVERSION N/A 10/13/2020   Procedure: TRANSESOPHAGEAL ECHOCARDIOGRAM (TEE);  Surgeon: Hazle Lites, MD;  Location: [facility] ENDOSCOPY;  Service: Cardiovascular;  Laterality: N/A;   VIDEO BRONCHOSCOPY WITH ENDOBRONCHIAL ULTRASOUND N/A 03/27/2020   Procedure: VIDEO BRONCHOSCOPY WITH ENDOBRONCHIAL ULTRASOUND;  Surgeon: Denson Flake, MD;  Location: [facility] ENDOSCOPY;  Service: Cardiopulmonary;  Laterality:  N/A;    Family History  Problem Relation Age of Onset   Arrhythmia Mother        has PPM   Heart disease Father        Died at 67, started in his 51s, heart attacks, had PPM and ICD    Social History:  reports that he quit smoking about 7 years ago. His smoking use included cigarettes. He started smoking about 37 years ago. He has a 30 pack-year smoking history. He has never used smokeless tobacco. He reports that he does not currently use  alcohol. He reports that he does not use drugs.  Allergies:  Allergies  Allergen Reactions   Ventolin  [Albuterol ] Other (See Comments)    Patient has had episode of atrial fib following administration of albuterol  (tolerates Xopenex  well)   Lipitor [Atorvastatin ] Other (See Comments)    Arthralgias    Oxycodone  Other (See Comments)    Severe Migranes    Medications: I have reviewed the patient's current medications.  Results for orders placed or performed during the hospital encounter of 06/06/23 (from the past 48 hours)  Basic metabolic panel     Status: Abnormal   Collection Time: 06/06/23 10:02 AM  Result Value Ref Range   Sodium 127 (L) 135 - 145 mmol/L   Potassium 3.4 (L) 3.5 - 5.1 mmol/L   Chloride 95 (L) 98 - 111 mmol/L   CO2 23 22 - 32 mmol/L   Glucose, Bld 156 (H) 70 - 99 mg/dL    Comment: Glucose reference range applies only to samples taken after fasting for at least 8 hours.   BUN 14 8 - 23 mg/dL   Creatinine, Ser 7.82 0.61 - 1.24 mg/dL   Calcium  8.2 (L) 8.9 - 10.3 mg/dL   GFR, Estimated >95 >62 mL/min    Comment: (NOTE) Calculated using the CKD-EPI Creatinine Equation (2021)    Anion gap 9 5 - 15    Comment: Performed at [facility], [address], [place], [place] [address]  CBC     Status: Abnormal   Collection Time: 06/06/23 10:02 AM  Result Value Ref Range   WBC 2.6 (L) 4.0 - 10.5 K/uL   RBC 4.11 (L) 4.22 - 5.81 MIL/uL   Hemoglobin 11.9 (L) 13.0 - 17.0 g/dL   HCT 57.8 (L) 46.9 - 62.9 %   MCV 86.6 80.0 - 100.0 fL   MCH 29.0 26.0 - 34.0 pg   MCHC 33.4 30.0 - 36.0 g/dL   RDW 52.8 41.3 - 24.4 %   Platelets 157 150 - 400 K/uL   nRBC 1.9 (H) 0.0 - 0.2 %    Comment: Performed at [facility], [address], [place], [place] [address]  Protime-INR     Status: Abnormal   Collection Time: 06/06/23 10:02 AM  Result Value Ref Range   Prothrombin Time 19.6 (H) 11.4 - 15.2 seconds   INR 1.6 (H) 0.8 - 1.2    Comment:  (NOTE) INR goal varies based on device and disease states. Performed at [facility], [address], [place], [place] [address]   Troponin I (High Sensitivity)     Status: Abnormal   Collection Time: 06/06/23 10:02 AM  Result Value Ref Range   Troponin I (High Sensitivity) 24 (H) <18 ng/L    Comment: (NOTE) Elevated high sensitivity troponin I (hsTnI) values and significant  changes across serial measurements may suggest ACS but many other  chronic and acute conditions  are known to elevate hsTnI results.  Refer to the "Links" section for chest pain algorithms and additional  guidance. Performed at [facility], [address], [place], [place] [address]   Hepatic function panel     Status: Abnormal   Collection Time: 06/06/23 10:02 AM  Result Value Ref Range   Total Protein 6.4 (L) 6.5 - 8.1 g/dL   Albumin  3.0 (L) 3.5 - 5.0 g/dL   AST 61 (H) 15 - 41 U/L   ALT 87 (H) 0 - 44 U/L   Alkaline Phosphatase 142 (H) 38 - 126 U/L   Total Bilirubin 4.7 (H) 0.0 - 1.2 mg/dL   Bilirubin, Direct 1.8 (H) 0.0 - 0.2 mg/dL   Indirect Bilirubin 2.9 (H) 0.3 - 0.9 mg/dL    Comment: Performed at [facility], [address] [place], [place] [address]  Magnesium      Status: None   Collection Time: 06/06/23 10:02 AM  Result Value Ref Range   Magnesium  1.8 1.7 - 2.4 mg/dL    Comment: Performed at [facility], [address], [place], [place] [address]  CBG monitoring, ED     Status: Abnormal   Collection Time: 06/06/23 10:46 AM  Result Value Ref Range   Glucose-Capillary 132 (H) 70 - 99 mg/dL    Comment: Glucose reference range applies only to samples taken after fasting for at least 8 hours.  I-Stat Lactic Acid, ED     Status: None   Collection Time: 06/06/23 11:01 AM  Result Value Ref Range   Lactic Acid, Venous 1.9 0.5 - 1.9 mmol/L  I-stat chem 8, ED     Status: Abnormal   Collection Time: 06/06/23 11:01 AM  Result  Value Ref Range   Sodium 130 (L) 135 - 145 mmol/L   Potassium 3.5 3.5 - 5.1 mmol/L   Chloride 96 (L) 98 - 111 mmol/L   BUN 12 8 - 23 mg/dL   Creatinine, Ser 9.14 (H) 0.61 - 1.24 mg/dL   Glucose, Bld 782 (H) 70 - 99 mg/dL    Comment: Glucose reference range applies only to samples taken after fasting for at least 8 hours.   Calcium , Ion 1.09 (L) 1.15 - 1.40 mmol/L   TCO2 24 22 - 32 mmol/L   Hemoglobin 10.5 (L) 13.0 - 17.0 g/dL   HCT 95.6 (L) 21.3 - 08.6 %  Blood Culture (routine x 2)     Status: None (Preliminary result)   Collection Time: 06/06/23 11:58 AM   Specimen: BLOOD  Result Value Ref Range   Specimen Description      BLOOD RIGHT ANTECUBITAL Performed at [facility], [address], [place], [place] [address]    Special Requests      BOTTLES DRAWN AEROBIC AND ANAEROBIC Blood Culture adequate volume Performed at [facility], [address], [place], [place] [address]    Culture      NO GROWTH < 24 HOURS Performed at [facility], [address] [place], [place] [address]    Report Status PENDING   Troponin I (High Sensitivity)     Status: Abnormal   Collection Time: 06/06/23 11:59 AM  Result Value Ref Range   Troponin I (High Sensitivity) 21 (H) <18 ng/L    Comment: (NOTE) Elevated high sensitivity troponin I (hsTnI) values and significant  changes across serial measurements may suggest ACS but many other  chronic and acute conditions are known to elevate hsTnI  results.  Refer to the "Links" section for chest pain algorithms and additional  guidance. Performed at [facility], [address], [place], [place] [address]   Blood Culture (routine x 2)     Status: None (Preliminary result)   Collection Time: 06/06/23 12:12 PM   Specimen: BLOOD  Result Value Ref Range   Specimen Description      BLOOD BLOOD LEFT HAND Performed at [facility], [address], [place], [place]  [address]    Special Requests      BOTTLES DRAWN AEROBIC AND ANAEROBIC Blood Culture adequate volume Performed at [facility], [address] [place], [place] [address]    Culture      NO GROWTH < 24 HOURS Performed at [facility], [address], [place], [place] [address]    Report Status PENDING   Urinalysis, Routine w reflex microscopic -Urine, Random     Status: Abnormal   Collection Time: 06/06/23  1:16 PM  Result Value Ref Range   Color, Urine AMBER (A) YELLOW    Comment: BIOCHEMICALS MAY BE AFFECTED BY COLOR   APPearance CLEAR CLEAR   Specific Gravity, Urine 1.027 1.005 - 1.030   pH 6.0 5.0 - 8.0   Glucose, UA NEGATIVE NEGATIVE mg/dL   Hgb urine dipstick NEGATIVE NEGATIVE   Bilirubin Urine NEGATIVE NEGATIVE   Ketones, ur NEGATIVE NEGATIVE mg/dL   Protein, ur NEGATIVE NEGATIVE mg/dL   Nitrite NEGATIVE NEGATIVE   Leukocytes,Ua NEGATIVE NEGATIVE    Comment: Performed at [facility], [address] [place], [place] [address]  Basic metabolic panel     Status: Abnormal   Collection Time: 06/07/23  3:32 AM  Result Value Ref Range   Sodium 129 (L) 135 - 145 mmol/L   Potassium 3.6 3.5 - 5.1 mmol/L   Chloride 99 98 - 111 mmol/L   CO2 26 22 - 32 mmol/L   Glucose, Bld 88 70 - 99 mg/dL    Comment: Glucose reference range applies only to samples taken after fasting for at least 8 hours.   BUN 10 8 - 23 mg/dL   Creatinine, Ser 1.32 0.61 - 1.24 mg/dL   Calcium  7.3 (L) 8.9 - 10.3 mg/dL   GFR, Estimated >44 >01 mL/min    Comment: (NOTE) Calculated using the CKD-EPI Creatinine Equation (2021)    Anion gap 4 (L) 5 - 15    Comment: Performed at [facility], [address] [place], [place] [address]  CBC     Status: Abnormal   Collection Time: 06/07/23  3:32 AM  Result Value Ref Range   WBC 2.7 (L) 4.0 - 10.5 K/uL   RBC 3.17 (L) 4.22 - 5.81 MIL/uL   Hemoglobin 9.3 (L) 13.0 - 17.0 g/dL   HCT 36.6 (L) 44.0 - 34.7 %    MCV 89.9 80.0 - 100.0 fL   MCH 29.3 26.0 - 34.0 pg   MCHC 32.6 30.0 - 36.0 g/dL   RDW 42.5 (H) 95.6 - 38.7 %   Platelets 112 (L) 150 - 400 K/uL   nRBC 2.6 (H) 0.0 - 0.2 %    Comment: Performed at [facility], [address], [place], [place] [address]  Lipase, blood     Status: None   Collection Time: 06/07/23  3:32 AM  Result Value Ref Range   Lipase 25 11 - 51 U/L    Comment: Performed at [facility],  [address], [place], [place] [address]  Hepatic function panel     Status: Abnormal   Collection Time: 06/07/23  3:32 AM  Result Value Ref Range   Total Protein 5.1 (L) 6.5 - 8.1 g/dL   Albumin  2.5 (L) 3.5 - 5.0 g/dL   AST 33 15 - 41 U/L   ALT 53 (H) 0 - 44 U/L   Alkaline Phosphatase 95 38 - 126 U/L   Total Bilirubin 2.1 (H) 0.0 - 1.2 mg/dL   Bilirubin, Direct 0.8 (H) 0.0 - 0.2 mg/dL   Indirect Bilirubin 1.3 (H) 0.3 - 0.9 mg/dL    Comment: Performed at [facility], [address], [place], [place] [address]    US  Abdomen Limited RUQ (LIVER/GB) Result Date: 06/06/2023 CLINICAL DATA:  Emesis EXAM: ULTRASOUND ABDOMEN LIMITED RIGHT UPPER QUADRANT COMPARISON:  CT 06/06/2023 FINDINGS: Gallbladder: Slightly distended gallbladder with shadowing stone at the neck. Slight increased wall thickness at 3.8 mm but no sonographic Murphy. Common bile duct: Diameter: 4.6 mm Liver: Echogenic liver parenchyma. Poorly visible due to habitus and bowel gas. Portal vein is patent on color Doppler imaging with normal direction of blood flow towards the liver. Other: None. IMPRESSION: 1. Cholelithiasis with slightly distended gallbladder and slight increased wall thickness but no sonographic Murphy. If there is high clinical concern for acute cholecystitis, consider correlation with nuclear medicine hepatobiliary imaging 2. Echogenic liver parenchyma consistent with hepatic steatosis and or hepatocellular disease. Electronically Signed   By: Esmeralda Hedge M.D.   On: 06/06/2023 20:18   CT Angio Chest/Abd/Pel for Dissection W and/or Wo Contrast Result Date: 06/06/2023 CLINICAL DATA:  Acute aortic syndrome suspected. Increased white blood cell count. EXAM: CT ANGIOGRAPHY CHEST, ABDOMEN AND PELVIS TECHNIQUE: Non-contrast CT of the chest was initially obtained. Multidetector CT imaging through the chest, abdomen and pelvis was performed using the standard protocol during bolus administration of intravenous contrast. Multiplanar reconstructed images and MIPs were obtained and reviewed to evaluate the vascular anatomy. RADIATION DOSE REDUCTION: This exam was performed according to the departmental dose-optimization program which includes automated exposure control, adjustment of the mA and/or kV according to patient size and/or use of iterative reconstruction technique. CONTRAST:  OMNIPAQUE  IOHEXOL  350 MG/ML SOLN COMPARISON:  CT chest abdomen and pelvis 04/29/2023. FINDINGS: CTA CHEST FINDINGS Cardiovascular: Preferential opacification of the thoracic aorta. No evidence of thoracic aortic aneurysm or dissection. Normal heart size. No pericardial effusion. There are atherosclerotic calcifications of the aorta. Mediastinum/Nodes: No enlarged mediastinal, hilar, or axillary lymph nodes. Thyroid  gland, trachea, and esophagus demonstrate no significant findings. Lungs/Pleura: Moderate emphysematous changes are again seen. Fibrotic changes in the lower lung predominance appear similar to the prior study. Patchy ground-glass opacities in the inferior right lower lobe and airspace consolidation in the superior segment of the right lower lobe and adjacent right upper lobe are again seen. Right upper lobe airspace consolidation has mildly increased. There is no pneumothorax or pleural effusion. Musculoskeletal: Healed rib fractures are again noted. No acute fractures are seen. T12 vertebral plasty changes are chronic fresh fracture deformity of T11 is unchanged.  Review of the MIP images confirms the above findings. CTA ABDOMEN AND PELVIS FINDINGS VASCULAR Aorta: Infrarenal abdominal aortic aneurysm measuring 3.4 cm appears unchanged. There are atherosclerotic calcifications of the aorta. There is no dissection or inflammation. Celiac: Patent without evidence of aneurysm, dissection, vasculitis or significant stenosis. SMA: Patent without evidence of aneurysm, dissection, vasculitis or significant stenosis. Renals: Both renal arteries are patent without  evidence of aneurysm, dissection, vasculitis, fibromuscular dysplasia or significant stenosis. IMA: Patent without evidence of aneurysm, dissection, vasculitis or significant stenosis. Inflow: Patent without evidence of aneurysm, dissection, vasculitis or significant stenosis. Calcified atherosclerotic disease is present. Veins: No obvious venous abnormality within the limitations of this arterial phase study. Review of the MIP images confirms the above findings. NON-VASCULAR Hepatobiliary: Gallbladder is distended. There is a gallstone in the gallbladder bladder neck measuring 16 mm. There is no pericholecystic inflammation. No focal liver lesions are identified. There is no biliary ductal dilatation. Pancreas: Unremarkable. No pancreatic ductal dilatation or surrounding inflammatory changes. Spleen: The spleen is mildly enlarged. Adrenals/Urinary Tract: Indeterminate right adrenal nodule measuring 2.6 cm is unchanged. Left adrenal gland is within normal limits. Left renal cyst measuring 9 mm is unchanged. There is no hydronephrosis in either kidney. The bladder is within normal limits. Stomach/Bowel: Stomach is within normal limits. Appendix appears normal. No evidence of bowel wall thickening, distention, or inflammatory changes. Lymphatic: No enlarged lymph nodes are seen. Reproductive: Prostate gland is within normal limits. Other: There are small fat containing left inguinal and umbilical hernias. There is no ascites.  Musculoskeletal: L1 and L3 vertebroplasty changes are present. Review of the MIP images confirms the above findings. IMPRESSION: 1. No evidence for thoracic aortic dissection or aneurysm. 2. Stable 3.4 cm infrarenal abdominal aortic aneurysm. Recommend follow-up ultrasound every 3 years. 3. Stable emphysema and fibrotic changes. 4. Mild increase in airspace consolidation in the right upper lobe worrisome for worsening pneumonia. 5. Cholelithiasis with distended gallbladder. No pericholecystic inflammation. 6. Stable indeterminate right adrenal nodule. 7. Mild splenomegaly. Aortic Atherosclerosis (ICD10-I70.0) and Emphysema (ICD10-J43.9). Electronically Signed   By: Tyron Gallon M.D.   On: 06/06/2023 16:12   DG Chest Port 1 View Result Date: 06/06/2023 CLINICAL DATA:  63 year old male with possible sepsis. Metastatic lung cancer. Fall yesterday. EXAM: PORTABLE CHEST 1 VIEW COMPARISON:  Restaging CT Chest, Abdomen, and Pelvis today are reported separately. 04/29/2023. FINDINGS: Portable AP semi upright view at 1024 hours. Chronically low lung volumes, slightly lower when compared to 04/29/2023 radiographs today. Stable mediastinal contours with right hilar architectural distortion. Combined right lung fibrosis and emphysema demonstrated by CT last month. No superimposed pneumothorax, pulmonary edema, pleural effusion, acute lung opacity. Visualized tracheal air column is within normal limits. Stable visualized osseous structures. Paucity of bowel gas. IMPRESSION: No acute cardiopulmonary abnormality; low lung volumes with underlying emphysema and post treatment right lung fibrosis. Electronically Signed   By: Marlise Simpers M.D.   On: 06/06/2023 12:41   MR Lumbar Spine W Wo Contrast Result Date: 06/06/2023 CLINICAL DATA:  63 year old male with stage IV lung cancer. Fall yesterday. Low back pain. EXAM: MRI LUMBAR SPINE WITHOUT AND WITH CONTRAST TECHNIQUE: Multiplanar and multiecho pulse sequences of the lumbar spine  were obtained without and with intravenous contrast. CONTRAST:  10mL GADAVIST  GADOBUTROL  1 MMOL/ML IV SOLN COMPARISON:  Lumbar MRI 08/20/2021.  Lumbar spine CT 10/02/2021. FINDINGS: Segmentation:  Normal on the comparison CT. Alignment: Lumbar lordosis not significantly changed from the 2023 CT. Vertebrae: Chronic, and several previously augmented, compression fractures T11 through L5. The T11 compression is new since 10/02/2021, but chronic with absent marrow edema. The other levels appear stable since 10/02/2021. Background bone marrow signal is within normal limits. Visible sacrum and SI joints are intact. No convincing marrow edema or acute osseous abnormality. Conus medullaris and cauda equina: Conus extends to the T11 level. Conu No lower spinal cord or conus signal abnormality. No abnormal intradural  enhancement. No dural thickening. Paraspinal and other soft tissues: Infrarenal abdominal aortic aneurysm, approximately 36 mm diameter now on series 9, image 12. This compares to 34 mm on 10/02/2021. Negative visualized posterior paraspinal soft tissues. Disc levels: Spinal stenosis throughout the lumbar spine related to disc bulging and mild retropulsion. This is mild to moderate at most levels. But moderate to severe multifactorial at the L4-L5 level. IMPRESSION: 1. Diffuse lower thoracic and lumbar compression fractures are chronic and appear likely to be benign. No acute or metastatic process identified in the lumbar spine. 2. Diffuse multifactorial lumbar spinal stenosis, moderate to severe at L4-L5. 3. Abdominal aortic aneurysm measuring 3.6 cm. Recommend surveillance ultrasound in 3 years. Reference: Journal of Vascular Surgery 67.1 (2018): 2-77. J Am Coll Radiol [phone]. Electronically Signed   By: Marlise Simpers M.D.   On: 06/06/2023 12:39    ROS negative except above Blood pressure 98/65, pulse 76, temperature (!) 97.4 F (36.3 C), temperature source Oral, resp. rate 17, height 5\' 11"  (1.803 m),  weight 113.4 kg, SpO2 95%. Physical Exam vital signs stable afebrile no acute distress he is tender in the midepigastric and right upper quadrant without rebound labs and x-rays reviewed  Assessment/Plan: Abdominal pain in patient with gallstones Plan: We discussed how he did not like his to chest tubes and he may not like his percutaneous drain and possibly holding chemotherapy let his counts come up and proceeding with lap chole might be a better way to go and if MRCP positive for CBD stones happy to proceed with ERCP and that procedure was thoroughly discussed including the risk benefits methods and success rate with he and his wife and will allow clear liquids after his 2 x-rays today and I will check on tomorrow please call me sooner if GI question or problems arise or if MRCP is positive  Xzaria Teo E 06/07/2023, 3:08 PM

## 2023-06-07 NOTE — Plan of Care (Signed)
  Problem: Clinical Measurements: Goal: Diagnostic test results will improve Outcome: Progressing   Problem: Activity: Goal: Risk for activity intolerance will decrease Outcome: Progressing   Problem: Coping: Goal: Level of anxiety will decrease Outcome: Progressing   

## 2023-06-07 NOTE — Consult Note (Signed)
 Consult Note  Kayla Dykman 04/20/1960  [id number].    Requesting MD: Katrina Parma, DO Chief Complaint/Reason for Consult: Cholelithiasis with abdominal pain  HPI:  Patient is a 63 year old male who presented to the ED with epigastric abdominal pain that radiates to the back and is associated with nausea and vomiting. This has been going on intermittently for the last month. He has been seen previously for this but pain was thought to be secondary to his cancer and he was given pain medications. Patient and wife do not report a history of significant abdominal pain secondary to his cancer. PMH significant for metastatic lung cancer with met to lymph nodes and right adrenal gland, he is currently on chemotherapy and just had first cycle of Gemzar /Navelbine  2 weeks ago. He is also on eliquis  for PAF, last dose 4/21 AM. No prior abdominal surgery. Allergic to albuterol  and intolerant of oxycodone  and lipitor. His wife is at bedside.   ROS: Negative other than HPI  Family History  Problem Relation Age of Onset   Arrhythmia Mother        has PPM   Heart disease Father        Died at 22, started in his 25s, heart attacks, had PPM and ICD    Past Medical History:  Diagnosis Date   Arthritis    Rheumatoid   Bacteremia due to Pseudomonas    C. difficile colitis    Chronic anxiety 11/08/2014   Cough 09/22/2021   COVID 2020   mild case   Depression    Drug-induced neutropenia (HCC) 08/11/2020   Family history of adverse reaction to anesthesia    brother with seizures had episode under anesthesia.  Patient has seizures as well.   GERD (gastroesophageal reflux disease)    History of radiation therapy 04/24/2020-05/16/2020   IMRT to right lung     Dr Retta Caster   History of radiation therapy    08/10/21-08/19/21-Dr. Retta Caster   History of radiation therapy    Abdomen- 03/21/23-03/31/23-Dr. Retta Caster   Neutropenia [facility]) 05/12/2020   Neutropenic fever (HCC) 05/12/2020    Normocytic anemia 04/18/2021   PAF (paroxysmal atrial fibrillation) (HCC)    CHADS2VSAC score 3   Rheumatoid aortitis    Secondary hypercoagulable state (HCC) 10/28/2020   Sepsis (HCC) 10/06/2020   Shortness of breath 12/18/2020   Squamous cell lung cancer [facility])     Past Surgical History:  Procedure Laterality Date   BRONCHIAL BRUSHINGS  03/27/2020   Procedure: BRONCHIAL BRUSHINGS;  Surgeon: Denson Flake, MD;  Location: [facility] ENDOSCOPY;  Service: Cardiopulmonary;;   BUBBLE STUDY  10/13/2020   Procedure: BUBBLE STUDY;  Surgeon: Hazle Lites, MD;  Location: [facility] ENDOSCOPY;  Service: Cardiovascular;;   CARDIOVERSION N/A 10/13/2020   Procedure: CARDIOVERSION;  Surgeon: Hazle Lites, MD;  Location: [facility] ENDOSCOPY;  Service: Cardiovascular;  Laterality: N/A;   CATARACT EXTRACTION  2016   at [facility]   FINE NEEDLE ASPIRATION  03/27/2020   Procedure: FINE NEEDLE ASPIRATION;  Surgeon: Denson Flake, MD;  Location: [facility] ENDOSCOPY;  Service: Cardiopulmonary;;   HIP SURGERY Left    IR KYPHO EA ADDL LEVEL THORACIC OR LUMBAR  10/12/2021   IR KYPHO LUMBAR INC FX REDUCE BONE BX UNI/BIL CANNULATION INC/IMAGING  10/12/2021   IR KYPHO THORACIC WITH BONE BIOPSY  09/07/2021   IR RADIOLOGIST EVAL & MGMT  09/03/2021   RADIOLOGY WITH ANESTHESIA N/A 10/12/2021   Procedure: L1 and L3 Kyphoplasty;  Surgeon: de Macedo Rodrigues, Katyucia, MD;  Location[facility] OR;  Service: Radiology;  Laterality: N/A;   TEE WITHOUT CARDIOVERSION N/A 10/13/2020   Procedure: TRANSESOPHAGEAL ECHOCARDIOGRAM (TEE);  Surgeon: Hazle Lites, MD;  Location: [facility] ENDOSCOPY;  Service: Cardiovascular;  Laterality: N/A;   VIDEO BRONCHOSCOPY WITH ENDOBRONCHIAL ULTRASOUND N/A 03/27/2020   Procedure: VIDEO BRONCHOSCOPY WITH ENDOBRONCHIAL ULTRASOUND;  Surgeon: Denson Flake, MD;  Location: [facility] ENDOSCOPY;  Service: Cardiopulmonary;  Laterality: N/A;    Social History:  reports that he quit smoking about 7 years ago. His smoking use included cigarettes.  He started smoking about 37 years ago. He has a 30 pack-year smoking history. He has never used smokeless tobacco. He reports that he does not currently use alcohol. He reports that he does not use drugs.  Allergies:  Allergies  Allergen Reactions   Ventolin  [Albuterol ] Other (See Comments)    Patient has had episode of atrial fib following administration of albuterol  (tolerates Xopenex  well)   Lipitor [Atorvastatin ] Other (See Comments)    Arthralgias    Oxycodone  Other (See Comments)    Severe Migranes    Medications Prior to Admission  Medication Sig Dispense Refill   acetaminophen  (TYLENOL ) 500 MG tablet Take 500 mg by mouth daily as needed for moderate pain (pain score 4-6), fever or headache.     amiodarone  (PACERONE ) 200 MG tablet START by taking 2 tablets (400mg ) daily for 2 weeks THEN reduce to 1 tablet (200mg ) daily (Patient taking differently: Take 200 mg by mouth daily.) 90 tablet 3   benzonatate  (TESSALON ) 200 MG capsule Take 1 capsule (200 mg total) by mouth 3 (three) times daily as needed. 45 capsule 1   ciprofloxacin  (CIPRO ) 500 MG tablet Take 1 tablet (500 mg total) by mouth daily with breakfast. 14 tablet 0   ELIQUIS  5 MG TABS tablet TAKE 1 TABLET(5 MG) BY MOUTH TWICE DAILY (Patient taking differently: Take 5 mg by mouth 2 (two) times daily.) 60 tablet 5   Evolocumab  (REPATHA  SURECLICK) 140 MG/ML SOAJ ADMINISTER 1 ML UNDER THE SKIN EVERY 14 DAYS 6 mL 3   fentaNYL  (DURAGESIC ) 25 MCG/HR Place 1 patch onto the skin every 3 (three) days. 10 patch 0   HYDROcodone  bit-homatropine (HYDROMET) 5-1.5 MG/5ML syrup Take 5 mLs by mouth at bedtime as needed for cough. Use with caution, sedating. Do not combine with other pain medications 120 mL 0   metoprolol  succinate (TOPROL -XL) 25 MG 24 hr tablet TAKE 1 TABLET(25 MG) BY MOUTH DAILY 90 tablet 3   ondansetron  (ZOFRAN ) 8 MG tablet Take 1 tablet (8 mg total) by mouth every 8 (eight) hours as needed for nausea or vomiting. 20 tablet 3    PARoxetine  (PAXIL ) 10 MG tablet Take 1 tablet (10 mg total) by mouth at bedtime. 90 tablet 2   predniSONE  (DELTASONE ) 5 MG tablet Take 1 tablet (5 mg total) by mouth daily with breakfast. 90 tablet 4   prochlorperazine  (COMPAZINE ) 10 MG tablet Take 1 tablet (10 mg total) by mouth every 6 (six) hours as needed for nausea or vomiting. 30 tablet 3   lidocaine  (LIDODERM ) 5 % Place 1 patch onto the skin daily. Remove & Discard patch within 12 hours or as directed by MD 30 patch 4   methimazole  (TAPAZOLE ) 5 MG tablet Take 5 mg by mouth daily. (Patient not taking: Reported on 06/06/2023)     oxyCODONE -acetaminophen  (PERCOCET/ROXICET) 5-325 MG tablet Take 2 tablets by mouth every 6 (six) hours as needed. (Patient not taking: Reported  on 06/06/2023) 30 tablet 0   pembrolizumab  (KEYTRUDA ) 100 MG/4ML SOLN Inject 2 mg/kg into the vein as directed. (Patient not taking: Reported on 06/06/2023)     TRELEGY ELLIPTA  100-62.5-25 MCG/ACT AEPB INHALE 1 PUFF INTO THE LUNGS DAILY 60 each 5    Blood pressure 94/66, pulse 82, temperature 98.7 F (37.1 C), temperature source Oral, resp. rate 16, height 5\' 11"  (1.803 m), weight 113.4 kg, SpO2 97%. Physical Exam:  General: pleasant, WD, obese male who is laying in bed in NAD HEENT: sclera anicteric Heart: regular, rate, and rhythm.   Lungs:  Respiratory effort nonlabored Abd: soft, TTP in RUQ with positive murphy sign, ND, small reducible umbilical hernia  MS: all 4 extremities are symmetrical with no cyanosis, clubbing, or edema. Skin: warm and dry with no masses, lesions, or rashes Psych: A&Ox3 with an appropriate affect.   Results for orders placed or performed during the hospital encounter of 06/06/23 (from the past 48 hours)  Basic metabolic panel     Status: Abnormal   Collection Time: 06/06/23 10:02 AM  Result Value Ref Range   Sodium 127 (L) 135 - 145 mmol/L   Potassium 3.4 (L) 3.5 - 5.1 mmol/L   Chloride 95 (L) 98 - 111 mmol/L   CO2 23 22 - 32 mmol/L    Glucose, Bld 156 (H) 70 - 99 mg/dL    Comment: Glucose reference range applies only to samples taken after fasting for at least 8 hours.   BUN 14 8 - 23 mg/dL   Creatinine, Ser 2.44 0.61 - 1.24 mg/dL   Calcium  8.2 (L) 8.9 - 10.3 mg/dL   GFR, Estimated >01 >02 mL/min    Comment: (NOTE) Calculated using the CKD-EPI Creatinine Equation (2021)    Anion gap 9 5 - 15    Comment: Performed at [facility], [address] [place], [place] [address]  CBC     Status: Abnormal   Collection Time: 06/06/23 10:02 AM  Result Value Ref Range   WBC 2.6 (L) 4.0 - 10.5 K/uL   RBC 4.11 (L) 4.22 - 5.81 MIL/uL   Hemoglobin 11.9 (L) 13.0 - 17.0 g/dL   HCT 64.4 (L) 03.4 - 74.2 %   MCV 86.6 80.0 - 100.0 fL   MCH 29.0 26.0 - 34.0 pg   MCHC 33.4 30.0 - 36.0 g/dL   RDW 59.5 63.8 - 75.6 %   Platelets 157 150 - 400 K/uL   nRBC 1.9 (H) 0.0 - 0.2 %    Comment: Performed at [facility], [address] [place], [place] [address]  Protime-INR     Status: Abnormal   Collection Time: 06/06/23 10:02 AM  Result Value Ref Range   Prothrombin Time 19.6 (H) 11.4 - 15.2 seconds   INR 1.6 (H) 0.8 - 1.2    Comment: (NOTE) INR goal varies based on device and disease states. Performed at [facility], [address] [place], [place] [address]   Troponin I (High Sensitivity)     Status: Abnormal   Collection Time: 06/06/23 10:02 AM  Result Value Ref Range   Troponin I (High Sensitivity) 24 (H) <18 ng/L    Comment: (NOTE) Elevated high sensitivity troponin I (hsTnI) values and significant  changes across serial measurements may suggest ACS but many other  chronic and acute conditions are known to elevate hsTnI results.  Refer to the "Links" section for chest pain algorithms and additional  guidance. Performed at [facility], [address] [place], [place] [address]   Hepatic function panel     Status: Abnormal   Collection Time:  06/06/23 10:02 AM  Result Value Ref Range   Total Protein 6.4 (L) 6.5 - 8.1 g/dL   Albumin  3.0 (L) 3.5 - 5.0 g/dL   AST 61 (H) 15 - 41 U/L   ALT 87 (H) 0 - 44 U/L   Alkaline Phosphatase 142 (H) 38 - 126 U/L   Total Bilirubin 4.7 (H) 0.0 - 1.2 mg/dL   Bilirubin, Direct 1.8 (H) 0.0 - 0.2 mg/dL   Indirect Bilirubin 2.9 (H) 0.3 - 0.9 mg/dL    Comment: Performed at [facility], [address], [place], [place] [address]  Magnesium      Status: None   Collection Time: 06/06/23 10:02 AM  Result Value Ref Range   Magnesium  1.8 1.7 - 2.4 mg/dL    Comment: Performed at [facility], [address], [place], [place] [address]  CBG monitoring, ED     Status: Abnormal   Collection Time: 06/06/23 10:46 AM  Result Value Ref Range   Glucose-Capillary 132 (H) 70 - 99 mg/dL    Comment: Glucose reference range applies only to samples taken after fasting for at least 8 hours.  I-Stat Lactic Acid, ED     Status: None   Collection Time: 06/06/23 11:01 AM  Result Value Ref Range   Lactic Acid, Venous 1.9 0.5 - 1.9 mmol/L  I-stat chem 8, ED     Status: Abnormal   Collection Time: 06/06/23 11:01 AM  Result Value Ref Range   Sodium 130 (L) 135 - 145 mmol/L   Potassium 3.5 3.5 - 5.1 mmol/L   Chloride 96 (L) 98 - 111 mmol/L   BUN 12 8 - 23 mg/dL   Creatinine, Ser 2.13 (H) 0.61 - 1.24 mg/dL   Glucose, Bld 086 (H) 70 - 99 mg/dL    Comment: Glucose reference range applies only to samples taken after fasting for at least 8 hours.   Calcium , Ion 1.09 (L) 1.15 - 1.40 mmol/L   TCO2 24 22 - 32 mmol/L   Hemoglobin 10.5 (L) 13.0 - 17.0 g/dL   HCT 57.8 (L) 46.9 - 62.9 %  Blood Culture (routine x 2)     Status: None (Preliminary result)   Collection Time: 06/06/23 11:58 AM   Specimen: BLOOD  Result Value Ref Range   Specimen Description      BLOOD RIGHT ANTECUBITAL Performed at [facility], [address], [place], [place] [address]    Special Requests       BOTTLES DRAWN AEROBIC AND ANAEROBIC Blood Culture adequate volume Performed at [facility], [address], [place], [place] [address]    Culture      NO GROWTH < 24 HOURS Performed at [facility], [address], [place], [place] [address]    Report Status PENDING   Troponin I (High Sensitivity)     Status: Abnormal   Collection Time: 06/06/23 11:59 AM  Result Value Ref Range   Troponin I (High Sensitivity) 21 (H) <18 ng/L    Comment: (NOTE) Elevated high sensitivity troponin I (hsTnI) values and significant  changes across serial measurements may suggest ACS but many other  chronic and acute conditions are known to elevate hsTnI results.  Refer to the "Links" section for chest pain algorithms and additional  guidance. Performed at [facility], [address] [place], [place] [address]   Blood Culture (routine x 2)     Status: None (Preliminary result)   Collection Time: 06/06/23 12:12 PM   Specimen: BLOOD  Result Value Ref Range   Specimen Description      BLOOD BLOOD LEFT HAND Performed at [facility], [address] [place], [place] [address]    Special Requests      BOTTLES DRAWN AEROBIC AND ANAEROBIC Blood Culture adequate volume Performed at [facility], [address], [place], [place] [address]    Culture      NO GROWTH < 24 HOURS Performed at [facility], [address], [place], [place] [address]    Report Status PENDING   Urinalysis, Routine w reflex microscopic -Urine, Random     Status: Abnormal   Collection Time: 06/06/23  1:16 PM  Result Value Ref Range   Color, Urine AMBER (A) YELLOW    Comment: BIOCHEMICALS MAY BE AFFECTED BY COLOR   APPearance CLEAR CLEAR   Specific Gravity, Urine 1.027 1.005 - 1.030   pH 6.0 5.0 - 8.0   Glucose, UA NEGATIVE NEGATIVE mg/dL   Hgb urine dipstick NEGATIVE NEGATIVE   Bilirubin Urine NEGATIVE NEGATIVE   Ketones, ur  NEGATIVE NEGATIVE mg/dL   Protein, ur NEGATIVE NEGATIVE mg/dL   Nitrite NEGATIVE NEGATIVE   Leukocytes,Ua NEGATIVE NEGATIVE    Comment: Performed at [facility], [address], [place], [place] [address]  Basic metabolic panel     Status: Abnormal   Collection Time: 06/07/23  3:32 AM  Result Value Ref Range   Sodium 129 (L) 135 - 145 mmol/L   Potassium 3.6 3.5 - 5.1 mmol/L   Chloride 99 98 - 111 mmol/L   CO2 26 22 - 32 mmol/L   Glucose, Bld 88 70 - 99 mg/dL    Comment: Glucose reference range applies only to samples taken after fasting for at least 8 hours.   BUN 10 8 - 23 mg/dL   Creatinine, Ser 5.78 0.61 - 1.24 mg/dL   Calcium  7.3 (L) 8.9 - 10.3 mg/dL   GFR, Estimated >46 >96 mL/min    Comment: (NOTE) Calculated using the CKD-EPI Creatinine Equation (2021)    Anion gap 4 (L) 5 - 15    Comment: Performed at [name], [address], [place], [place] [address]  CBC     Status: Abnormal   Collection Time: 06/07/23  3:32 AM  Result Value Ref Range   WBC 2.7 (L) 4.0 - 10.5 K/uL   RBC 3.17 (L) 4.22 - 5.81 MIL/uL   Hemoglobin 9.3 (L) 13.0 - 17.0 g/dL   HCT 41.3 (L) 24.4 - 01.0 %   MCV 89.9 80.0 - 100.0 fL   MCH 29.3 26.0 - 34.0 pg   MCHC 32.6 30.0 - 36.0 g/dL   RDW 27.2 (H) 53.6 - 64.4 %   Platelets 112 (L) 150 - 400 K/uL   nRBC 2.6 (H) 0.0 - 0.2 %    Comment: Performed at [facility], [address] [place], [place] [address]  Lipase, blood     Status: None   Collection Time: 06/07/23  3:32 AM  Result Value Ref Range   Lipase 25 11 - 51 U/L    Comment: Performed at [facility], [address] [place], [place] [address]  Hepatic function panel     Status: Abnormal   Collection Time: 06/07/23  3:32 AM  Result Value Ref Range   Total Protein 5.1 (L) 6.5 - 8.1 g/dL   Albumin  2.5 (L) 3.5 - 5.0 g/dL   AST 33 15 - 41 U/L   ALT 53 (H) 0 - 44 U/L   Alkaline Phosphatase 95 38 - 126 U/L   Total  Bilirubin 2.1 (H) 0.0 - 1.2 mg/dL   Bilirubin, Direct 0.8 (H) 0.0 - 0.2 mg/dL   Indirect Bilirubin 1.3 (H) 0.3 - 0.9 mg/dL    Comment: Performed at [facility], [address] [place], [place] [address]   US  Abdomen Limited RUQ (LIVER/GB) Result Date: 06/06/2023 CLINICAL DATA:  Emesis EXAM: ULTRASOUND ABDOMEN LIMITED RIGHT UPPER QUADRANT COMPARISON:  CT 06/06/2023 FINDINGS: Gallbladder: Slightly distended gallbladder with shadowing stone at the neck. Slight increased wall thickness at 3.8 mm but no sonographic Murphy. Common bile duct: Diameter: 4.6 mm Liver: Echogenic liver parenchyma. Poorly visible due to habitus and bowel gas. Portal vein is patent on color Doppler imaging with normal direction of blood flow towards the liver. Other: None. IMPRESSION: 1. Cholelithiasis with slightly distended gallbladder and slight increased wall thickness but no sonographic Murphy. If there is high clinical concern for acute cholecystitis, consider correlation with nuclear medicine hepatobiliary imaging 2. Echogenic liver parenchyma consistent with hepatic steatosis and or hepatocellular disease. Electronically Signed   By: Esmeralda Hedge M.D.   On: 06/06/2023 20:18   CT Angio Chest/Abd/Pel for Dissection W and/or Wo Contrast Result Date: 06/06/2023 CLINICAL DATA:  Acute aortic syndrome suspected. Increased white blood cell count. EXAM: CT ANGIOGRAPHY CHEST, ABDOMEN AND PELVIS TECHNIQUE: Non-contrast CT of the chest was initially obtained. Multidetector CT imaging through the chest, abdomen and pelvis was performed using the standard protocol during bolus administration of intravenous contrast. Multiplanar reconstructed images and MIPs were obtained and reviewed to evaluate the vascular anatomy. RADIATION DOSE REDUCTION: This exam was performed according to the departmental dose-optimization program which includes automated exposure control, adjustment of the mA and/or kV according to patient size  and/or use of iterative reconstruction technique. CONTRAST:  OMNIPAQUE  IOHEXOL  350 MG/ML SOLN COMPARISON:  CT chest abdomen and pelvis 04/29/2023. FINDINGS: CTA CHEST FINDINGS Cardiovascular: Preferential opacification of the thoracic aorta. No evidence of thoracic aortic aneurysm or dissection. Normal heart size. No pericardial effusion. There are atherosclerotic calcifications of the aorta. Mediastinum/Nodes: No enlarged mediastinal, hilar, or axillary lymph nodes. Thyroid  gland, trachea, and esophagus demonstrate no significant findings. Lungs/Pleura: Moderate emphysematous changes are again seen. Fibrotic changes in the lower lung predominance appear similar to the prior study. Patchy ground-glass opacities in the inferior right lower lobe and airspace consolidation in the superior segment of the right lower lobe and adjacent right upper lobe are again seen. Right upper lobe airspace consolidation has mildly increased. There is no pneumothorax or pleural effusion. Musculoskeletal: Healed rib fractures are again noted. No acute fractures are seen. T12 vertebral plasty changes are chronic fresh fracture deformity of T11 is unchanged. Review of the MIP images confirms the above findings. CTA ABDOMEN AND PELVIS FINDINGS VASCULAR Aorta: Infrarenal abdominal aortic aneurysm measuring 3.4 cm appears unchanged. There are atherosclerotic calcifications of the aorta. There is no dissection or inflammation. Celiac: Patent without evidence of aneurysm, dissection, vasculitis or significant stenosis. SMA: Patent without evidence of aneurysm, dissection, vasculitis or significant stenosis. Renals: Both renal arteries are patent without evidence of aneurysm, dissection, vasculitis, fibromuscular dysplasia or significant stenosis. IMA: Patent without evidence of aneurysm, dissection, vasculitis or significant stenosis. Inflow: Patent without evidence of aneurysm,  dissection, vasculitis or significant stenosis. Calcified  atherosclerotic disease is present. Veins: No obvious venous abnormality within the limitations of this arterial phase study. Review of the MIP images confirms the above findings. NON-VASCULAR Hepatobiliary: Gallbladder is distended. There is a gallstone in the gallbladder bladder neck measuring 16 mm. There is no pericholecystic inflammation. No focal liver lesions are identified. There is no biliary ductal dilatation. Pancreas: Unremarkable. No pancreatic ductal dilatation or surrounding inflammatory changes. Spleen: The spleen is mildly enlarged. Adrenals/Urinary Tract: Indeterminate right adrenal nodule measuring 2.6 cm is unchanged. Left adrenal gland is within normal limits. Left renal cyst measuring 9 mm is unchanged. There is no hydronephrosis in either kidney. The bladder is within normal limits. Stomach/Bowel: Stomach is within normal limits. Appendix appears normal. No evidence of bowel wall thickening, distention, or inflammatory changes. Lymphatic: No enlarged lymph nodes are seen. Reproductive: Prostate gland is within normal limits. Other: There are small fat containing left inguinal and umbilical hernias. There is no ascites. Musculoskeletal: L1 and L3 vertebroplasty changes are present. Review of the MIP images confirms the above findings. IMPRESSION: 1. No evidence for thoracic aortic dissection or aneurysm. 2. Stable 3.4 cm infrarenal abdominal aortic aneurysm. Recommend follow-up ultrasound every 3 years. 3. Stable emphysema and fibrotic changes. 4. Mild increase in airspace consolidation in the right upper lobe worrisome for worsening pneumonia. 5. Cholelithiasis with distended gallbladder. No pericholecystic inflammation. 6. Stable indeterminate right adrenal nodule. 7. Mild splenomegaly. Aortic Atherosclerosis (ICD10-I70.0) and Emphysema (ICD10-J43.9). Electronically Signed   By: Tyron Gallon M.D.   On: 06/06/2023 16:12   DG Chest Port 1 View Result Date: 06/06/2023 CLINICAL DATA:   63 year old male with possible sepsis. Metastatic lung cancer. Fall yesterday. EXAM: PORTABLE CHEST 1 VIEW COMPARISON:  Restaging CT Chest, Abdomen, and Pelvis today are reported separately. 04/29/2023. FINDINGS: Portable AP semi upright view at 1024 hours. Chronically low lung volumes, slightly lower when compared to 04/29/2023 radiographs today. Stable mediastinal contours with right hilar architectural distortion. Combined right lung fibrosis and emphysema demonstrated by CT last month. No superimposed pneumothorax, pulmonary edema, pleural effusion, acute lung opacity. Visualized tracheal air column is within normal limits. Stable visualized osseous structures. Paucity of bowel gas. IMPRESSION: No acute cardiopulmonary abnormality; low lung volumes with underlying emphysema and post treatment right lung fibrosis. Electronically Signed   By: Marlise Simpers M.D.   On: 06/06/2023 12:41   MR Lumbar Spine W Wo Contrast Result Date: 06/06/2023 CLINICAL DATA:  63 year old male with stage IV lung cancer. Fall yesterday. Low back pain. EXAM: MRI LUMBAR SPINE WITHOUT AND WITH CONTRAST TECHNIQUE: Multiplanar and multiecho pulse sequences of the lumbar spine were obtained without and with intravenous contrast. CONTRAST:  10mL GADAVIST  GADOBUTROL  1 MMOL/ML IV SOLN COMPARISON:  Lumbar MRI 08/20/2021.  Lumbar spine CT 10/02/2021. FINDINGS: Segmentation:  Normal on the comparison CT. Alignment: Lumbar lordosis not significantly changed from the 2023 CT. Vertebrae: Chronic, and several previously augmented, compression fractures T11 through L5. The T11 compression is new since 10/02/2021, but chronic with absent marrow edema. The other levels appear stable since 10/02/2021. Background bone marrow signal is within normal limits. Visible sacrum and SI joints are intact. No convincing marrow edema or acute osseous abnormality. Conus medullaris and cauda equina: Conus extends to the T11 level. Conu No lower spinal cord or conus signal  abnormality. No abnormal intradural enhancement. No dural thickening. Paraspinal and other soft tissues: Infrarenal abdominal aortic aneurysm, approximately 36 mm diameter now on series 9, image 12. This compares to  34 mm on 10/02/2021. Negative visualized posterior paraspinal soft tissues. Disc levels: Spinal stenosis throughout the lumbar spine related to disc bulging and mild retropulsion. This is mild to moderate at most levels. But moderate to severe multifactorial at the L4-L5 level. IMPRESSION: 1. Diffuse lower thoracic and lumbar compression fractures are chronic and appear likely to be benign. No acute or metastatic process identified in the lumbar spine. 2. Diffuse multifactorial lumbar spinal stenosis, moderate to severe at L4-L5. 3. Abdominal aortic aneurysm measuring 3.6 cm. Recommend surveillance ultrasound in 3 years. Reference: Journal of Vascular Surgery 67.1 (2018): 2-77. J Am Coll Radiol [phone]. Electronically Signed   By: Marlise Simpers M.D.   On: 06/06/2023 12:39      Assessment/Plan Cholelithiasis and probable acute cholecystitis  - RUQ US  with stone in the neck of the gallbladder and mild gallbladder wall thickening, hepatic steatosis  - pt is neutropenic, afebrile  - Tbili 4.7 on admit, down to 2.1 today; Alk phos was 142 and now normalized, AST/ALT 61/87 and now 33/53 - MRCP pending  - last dose eliquis  4/21 AM, continue to hold, ok for heparin  gtt if needed - last dose Gemzar /Navelbine  4/10, finished XRT for right adrenal 04/04/23 - HIDA to confirm given equivocal US  - Will discuss with MD but in setting of recent chemo and radiation not sure this patient is a surgical candidate for cholecystectomy. May need to consider percutaneous cholecystostomy   FEN: NPO, IVF per [facility] VTE: holding eliquis  ID: would continue rocephin  for cholecystitis   - per [facility] -  Stage 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 Drug-induced neutropenia Chronic arthritis Paroxysmal A. Fib on  Eliquis  Anxiety and depression  I reviewed ED provider notes, hospitalist notes, last 24 h vitals and pain scores, last 48 h intake and output, last 24 h labs and trends, and last 24 h imaging results.  This care required high  level of medical decision making.   Annetta Killian, PA-C [facility] 06/07/2023, 11:00 AM Please see Amion for pager number during day hours 7:00am-4:30pm

## 2023-06-08 ENCOUNTER — Inpatient Hospital Stay (HOSPITAL_COMMUNITY)

## 2023-06-08 ENCOUNTER — Encounter (HOSPITAL_COMMUNITY): Payer: Self-pay | Admitting: Internal Medicine

## 2023-06-08 ENCOUNTER — Inpatient Hospital Stay (HOSPITAL_COMMUNITY): Admitting: Anesthesiology

## 2023-06-08 ENCOUNTER — Encounter (HOSPITAL_COMMUNITY)
Admission: EM | Disposition: A | Discharge status: Home / Self Care | Payer: Self-pay | Source: Home / Self Care | Attending: Internal Medicine

## 2023-06-08 ENCOUNTER — Other Ambulatory Visit: Payer: Self-pay

## 2023-06-08 DIAGNOSIS — D61818 Other pancytopenia: Secondary | ICD-10-CM | POA: Insufficient documentation

## 2023-06-08 DIAGNOSIS — K810 Acute cholecystitis: Secondary | ICD-10-CM

## 2023-06-08 DIAGNOSIS — I480 Paroxysmal atrial fibrillation: Secondary | ICD-10-CM | POA: Diagnosis not present

## 2023-06-08 DIAGNOSIS — K8067 Calculus of gallbladder and bile duct with acute and chronic cholecystitis with obstruction: Secondary | ICD-10-CM

## 2023-06-08 HISTORY — DX: Calculus of gallbladder and bile duct with acute and chronic cholecystitis with obstruction: K80.67

## 2023-06-08 HISTORY — PX: CHOLECYSTECTOMY: SHX55

## 2023-06-08 LAB — CBC WITH DIFFERENTIAL/PLATELET
Abs Immature Granulocytes: 0.18 10*3/uL — ABNORMAL HIGH (ref 0.00–0.07)
Basophils Absolute: 0 10*3/uL (ref 0.0–0.1)
Basophils Relative: 0 %
Eosinophils Absolute: 0 10*3/uL (ref 0.0–0.5)
Eosinophils Relative: 1 %
HCT: 31.1 % — ABNORMAL LOW (ref 39.0–52.0)
Hemoglobin: 10 g/dL — ABNORMAL LOW (ref 13.0–17.0)
Immature Granulocytes: 5 %
Lymphocytes Relative: 8 %
Lymphs Abs: 0.3 10*3/uL — ABNORMAL LOW (ref 0.7–4.0)
MCH: 28.9 pg (ref 26.0–34.0)
MCHC: 32.2 g/dL (ref 30.0–36.0)
MCV: 89.9 fL (ref 80.0–100.0)
Monocytes Absolute: 0.6 10*3/uL (ref 0.1–1.0)
Monocytes Relative: 18 %
Neutro Abs: 2.3 10*3/uL (ref 1.7–7.7)
Neutrophils Relative %: 68 %
Platelets: 128 10*3/uL — ABNORMAL LOW (ref 150–400)
RBC: 3.46 MIL/uL — ABNORMAL LOW (ref 4.22–5.81)
RDW: 16.8 % — ABNORMAL HIGH (ref 11.5–15.5)
WBC: 3.4 10*3/uL — ABNORMAL LOW (ref 4.0–10.5)
nRBC: 2.3 % — ABNORMAL HIGH (ref 0.0–0.2)

## 2023-06-08 LAB — COMPREHENSIVE METABOLIC PANEL WITH GFR
ALT: 44 U/L (ref 0–44)
AST: 30 U/L (ref 15–41)
Albumin: 2.5 g/dL — ABNORMAL LOW (ref 3.5–5.0)
Alkaline Phosphatase: 93 U/L (ref 38–126)
Anion gap: 8 (ref 5–15)
BUN: 7 mg/dL — ABNORMAL LOW (ref 8–23)
CO2: 22 mmol/L (ref 22–32)
Calcium: 7.4 mg/dL — ABNORMAL LOW (ref 8.9–10.3)
Chloride: 107 mmol/L (ref 98–111)
Creatinine, Ser: 0.89 mg/dL (ref 0.61–1.24)
GFR, Estimated: >60 mL/min (ref >60)
Glucose, Bld: 99 mg/dL (ref 70–99)
Potassium: 3.6 mmol/L (ref 3.5–5.1)
Sodium: 137 mmol/L (ref 135–145)
Total Bilirubin: 1.3 mg/dL — ABNORMAL HIGH (ref 0.0–1.2)
Total Protein: 5.2 g/dL — ABNORMAL LOW (ref 6.5–8.1)

## 2023-06-08 LAB — CBC
HCT: 29.4 % — ABNORMAL LOW (ref 39.0–52.0)
Hemoglobin: 9.3 g/dL — ABNORMAL LOW (ref 13.0–17.0)
MCH: 29.2 pg (ref 26.0–34.0)
MCHC: 31.6 g/dL (ref 30.0–36.0)
MCV: 92.5 fL (ref 80.0–100.0)
Platelets: 105 10*3/uL — ABNORMAL LOW (ref 150–400)
RBC: 3.18 MIL/uL — ABNORMAL LOW (ref 4.22–5.81)
RDW: 16.4 % — ABNORMAL HIGH (ref 11.5–15.5)
WBC: 2.9 10*3/uL — ABNORMAL LOW (ref 4.0–10.5)
nRBC: 2.4 % — ABNORMAL HIGH (ref 0.0–0.2)

## 2023-06-08 LAB — MAGNESIUM: Magnesium: 2.4 mg/dL (ref 1.7–2.4)

## 2023-06-08 LAB — LEGIONELLA PNEUMOPHILA SEROGP 1 UR AG: L. pneumophila Serogp 1 Ur Ag: NEGATIVE

## 2023-06-08 SURGERY — LAPAROSCOPIC CHOLECYSTECTOMY
Anesthesia: General

## 2023-06-08 MED ORDER — FENTANYL CITRATE (PF) 100 MCG/2ML IJ SOLN
INTRAMUSCULAR | Status: DC | PRN
  Administered 2023-06-08 (×3): 50 ug via INTRAVENOUS

## 2023-06-08 MED ORDER — FENTANYL CITRATE (PF) 100 MCG/2ML IJ SOLN
INTRAMUSCULAR | Status: AC
  Filled 2023-06-08: qty 2

## 2023-06-08 MED ORDER — DEXAMETHASONE SODIUM PHOSPHATE 4 MG/ML IJ SOLN
INTRAMUSCULAR | Status: DC | PRN
  Administered 2023-06-08: 5 mg via INTRAVENOUS

## 2023-06-08 MED ORDER — BUPIVACAINE-EPINEPHRINE 0.25% -1:200000 IJ SOLN
INTRAMUSCULAR | Status: DC | PRN
  Administered 2023-06-08: 30 mL

## 2023-06-08 MED ORDER — LIDOCAINE HCL (CARDIAC) PF 100 MG/5ML IV SOSY
PREFILLED_SYRINGE | INTRAVENOUS | Status: DC | PRN
  Administered 2023-06-08: 80 mg via INTRAVENOUS

## 2023-06-08 MED ORDER — ROCURONIUM BROMIDE 100 MG/10ML IV SOLN
INTRAVENOUS | Status: DC | PRN
  Administered 2023-06-08: 70 mg via INTRAVENOUS

## 2023-06-08 MED ORDER — CHLORHEXIDINE GLUCONATE 0.12 % MT SOLN
15.0000 mL | Freq: Once | OROMUCOSAL | Status: AC
  Administered 2023-06-08: 15 mL via OROMUCOSAL

## 2023-06-08 MED ORDER — TRAMADOL HCL 50 MG PO TABS
50.0000 mg | ORAL_TABLET | Freq: Four times a day (QID) | ORAL | Status: DC | PRN
  Administered 2023-06-09 (×2): 50 mg via ORAL
  Filled 2023-06-08 (×2): qty 1

## 2023-06-08 MED ORDER — HEMOSTATIC AGENTS (NO CHARGE) OPTIME
TOPICAL | Status: DC | PRN
Start: 2023-06-08 — End: 2023-06-08
  Administered 2023-06-08: 1

## 2023-06-08 MED ORDER — ONDANSETRON HCL 4 MG/2ML IJ SOLN
INTRAMUSCULAR | Status: DC | PRN
  Administered 2023-06-08: 4 mg via INTRAVENOUS

## 2023-06-08 MED ORDER — GADOBUTROL 1 MMOL/ML IV SOLN
10.0000 mL | Freq: Once | INTRAVENOUS | Status: AC | PRN
  Administered 2023-06-08: 10 mL via INTRAVENOUS

## 2023-06-08 MED ORDER — 0.9 % SODIUM CHLORIDE (POUR BTL) OPTIME
TOPICAL | Status: DC | PRN
  Administered 2023-06-08: 1000 mL

## 2023-06-08 MED ORDER — AMISULPRIDE (ANTIEMETIC) 5 MG/2ML IV SOLN: 10.0000 mg | Freq: Once | INTRAVENOUS | Status: DC | PRN

## 2023-06-08 MED ORDER — DEXAMETHASONE SODIUM PHOSPHATE 10 MG/ML IJ SOLN
INTRAMUSCULAR | Status: AC
  Filled 2023-06-08: qty 1

## 2023-06-08 MED ORDER — ACETAMINOPHEN 500 MG PO TABS
ORAL_TABLET | ORAL | Status: AC
  Administered 2023-06-08: 1000 mg via ORAL
  Filled 2023-06-08: qty 2

## 2023-06-08 MED ORDER — FENTANYL CITRATE PF 50 MCG/ML IJ SOSY
25.0000 ug | PREFILLED_SYRINGE | INTRAMUSCULAR | Status: DC | PRN
  Administered 2023-06-08: 50 ug via INTRAVENOUS

## 2023-06-08 MED ORDER — LACTATED RINGERS IR SOLN
Status: DC | PRN
  Administered 2023-06-08: 1000 mL

## 2023-06-08 MED ORDER — ACETAMINOPHEN 10 MG/ML IV SOLN: 1000.0000 mg | Freq: Once | INTRAVENOUS | Status: DC | PRN

## 2023-06-08 MED ORDER — PROPOFOL 10 MG/ML IV BOLUS
INTRAVENOUS | Status: DC | PRN
  Administered 2023-06-08: 150 mg via INTRAVENOUS

## 2023-06-08 MED ORDER — FENTANYL CITRATE PF 50 MCG/ML IJ SOSY
PREFILLED_SYRINGE | INTRAMUSCULAR | Status: AC
  Filled 2023-06-08: qty 1

## 2023-06-08 MED ORDER — ORAL CARE MOUTH RINSE: 15.0000 mL | Freq: Once | OROMUCOSAL | Status: AC

## 2023-06-08 MED ORDER — ACETAMINOPHEN 500 MG PO TABS: 1000.0000 mg | ORAL_TABLET | Freq: Once | ORAL | Status: AC

## 2023-06-08 MED ORDER — MIDAZOLAM HCL 5 MG/5ML IJ SOLN
INTRAMUSCULAR | Status: DC | PRN
  Administered 2023-06-08: 1 mg via INTRAVENOUS

## 2023-06-08 MED ORDER — LACTATED RINGERS IV SOLN
INTRAVENOUS | Status: DC
Start: 2023-06-08 — End: 2023-06-08

## 2023-06-08 MED ORDER — SUGAMMADEX SODIUM 200 MG/2ML IV SOLN
INTRAVENOUS | Status: DC | PRN
Start: 2023-06-08 — End: 2023-06-08
  Administered 2023-06-08: 200 mg via INTRAVENOUS

## 2023-06-08 MED ORDER — MIDAZOLAM HCL 2 MG/2ML IJ SOLN
INTRAMUSCULAR | Status: AC
  Filled 2023-06-08: qty 2

## 2023-06-08 MED ORDER — LACTATED RINGERS IV SOLN: INTRAVENOUS | Status: DC | PRN

## 2023-06-08 MED ORDER — LIDOCAINE HCL (PF) 2 % IJ SOLN
INTRAMUSCULAR | Status: AC
  Filled 2023-06-08: qty 10

## 2023-06-08 MED ORDER — BUPIVACAINE-EPINEPHRINE (PF) 0.25% -1:200000 IJ SOLN
INTRAMUSCULAR | Status: AC
  Filled 2023-06-08: qty 30

## 2023-06-08 MED ORDER — ONDANSETRON HCL 4 MG/2ML IJ SOLN
INTRAMUSCULAR | Status: AC
  Filled 2023-06-08: qty 2

## 2023-06-08 MED ORDER — ROCURONIUM BROMIDE 10 MG/ML (PF) SYRINGE
PREFILLED_SYRINGE | INTRAVENOUS | Status: AC
  Filled 2023-06-08: qty 10

## 2023-06-08 SURGICAL SUPPLY — 36 items
BAG COUNTER SPONGE SURGICOUNT (BAG) IMPLANT
CABLE HIGH FREQUENCY MONO STRZ (ELECTRODE) ×1 IMPLANT
CATH URETL OPEN 5X70 (CATHETERS) IMPLANT
CHLORAPREP W/TINT 26 (MISCELLANEOUS) ×1 IMPLANT
CLIP APPLIE ROT 10 11.4 M/L (STAPLE) ×1 IMPLANT
COVER MAYO STAND XLG (MISCELLANEOUS) ×1 IMPLANT
COVER SURGICAL LIGHT HANDLE (MISCELLANEOUS) ×1 IMPLANT
DERMABOND ADVANCED .7 DNX12 (GAUZE/BANDAGES/DRESSINGS) ×1 IMPLANT
DRAPE C-ARM 42X120 X-RAY (DRAPES) IMPLANT
ELECT REM PT RETURN 15FT ADLT (MISCELLANEOUS) ×1 IMPLANT
ENDOLOOP SUT PDS II 0 18 (SUTURE) ×1 IMPLANT
GLOVE BIO SURGEON STRL SZ7.5 (GLOVE) ×1 IMPLANT
GLOVE INDICATOR 8.0 STRL GRN (GLOVE) ×1 IMPLANT
GOWN STRL REUS W/ TWL XL LVL3 (GOWN DISPOSABLE) ×2 IMPLANT
GRASPER SUT TROCAR 14GX15 (MISCELLANEOUS) IMPLANT
HEMOSTAT SNOW SURGICEL 2X4 (HEMOSTASIS) IMPLANT
IRRIGATION SUCT STRKRFLW 2 WTP (MISCELLANEOUS) ×1 IMPLANT
IV CATH 14GX2 1/4 (CATHETERS) ×1 IMPLANT
KIT BASIN OR (CUSTOM PROCEDURE TRAY) ×1 IMPLANT
KIT TURNOVER KIT A (KITS) IMPLANT
NDL INSUFFLATION 14GA 120MM (NEEDLE) ×1 IMPLANT
NEEDLE INSUFFLATION 14GA 120MM (NEEDLE) ×1 IMPLANT
POUCH RETRIEVAL ECOSAC 10 (ENDOMECHANICALS) ×1 IMPLANT
SCISSORS LAP 5X35 DISP (ENDOMECHANICALS) ×1 IMPLANT
SET TUBE SMOKE EVAC HIGH FLOW (TUBING) ×1 IMPLANT
SLEEVE ADV FIXATION 12X100MM (TROCAR) IMPLANT
SLEEVE Z-THREAD 5X100MM (TROCAR) ×2 IMPLANT
SPIKE FLUID TRANSFER (MISCELLANEOUS) ×1 IMPLANT
STOPCOCK 4 WAY LG BORE MALE ST (IV SETS) IMPLANT
SUT MNCRL AB 4-0 PS2 18 (SUTURE) ×1 IMPLANT
SUT VICRYL 0 TIES 12 18 (SUTURE) IMPLANT
SUT VICRYL 0 UR6 27IN ABS (SUTURE) IMPLANT
TOWEL OR 17X26 10 PK STRL BLUE (TOWEL DISPOSABLE) ×1 IMPLANT
TRAY LAPAROSCOPIC (CUSTOM PROCEDURE TRAY) ×1 IMPLANT
TROCAR ADV FIXATION 12X100MM (TROCAR) ×1 IMPLANT
TROCAR Z-THREAD OPTICAL 5X100M (TROCAR) ×1 IMPLANT

## 2023-06-08 NOTE — Progress Notes (Addendum)
 Progress Note     Subjective: Pt still having some RUQ pain, not quite as severe as it has been. He has a hx of pleurex catheter from his lung cancer and was not fond of those. We did discuss that he may still require a surgical drain after gallbladder surgery as well as risk of possible open procedure. He and his wife both prefer to proceed with surgery.   Objective: Vital signs in last 24 hours: Temp:  [97.4 F (36.3 C)-97.6 F (36.4 C)] 97.6 F (36.4 C) (04/22 2131) Pulse Rate:  [74-80] 74 (04/23 0932) Resp:  [17] 17 (04/22 2131) BP: (98-108)/(65-74) 98/74 (04/23 0932) SpO2:  [95 %-96 %] 96 % (04/23 0909) Last BM Date : 06/07/23  Intake/Output from previous day: 04/22 0701 - 04/23 0700 In: 1066.5 [P.O.:90; I.V.:576.5; IV Piggyback:400] Out: 1900 [Urine:1900] Intake/Output this shift: No intake/output data recorded.  PE: General: pleasant, WD, obese male who is laying in bed in NAD HEENT: sclera anicteric Heart: regular, rate, and rhythm.   Lungs:  Respiratory effort nonlabored Abd: soft, TTP in RUQ, small reducible umbilical hernia  MS: all 4 extremities are symmetrical with no cyanosis, clubbing, or edema. Skin: warm and dry with no masses, lesions, or rashes Psych: A&Ox3 with an appropriate affect.   Lab Results:  Recent Labs    06/08/23 0339 06/08/23 0733  WBC 2.9* 3.4*  HGB 9.3* 10.0*  HCT 29.4* 31.1*  PLT 105* 128*   BMET Recent Labs    06/07/23 0332 06/08/23 0339  NA 129* 137  K 3.6 3.6  CL 99 107  CO2 26 22  GLUCOSE 88 99  BUN 10 7*  CREATININE 0.98 0.89  CALCIUM  7.3* 7.4*   PT/INR Recent Labs    06/06/23 1002  LABPROT 19.6*  INR 1.6*   CMP     Component Value Date/Time   NA 137 06/08/2023 0339   NA 139 04/12/2023 0937   K 3.6 06/08/2023 0339   CL 107 06/08/2023 0339   CO2 22 06/08/2023 0339   GLUCOSE 99 06/08/2023 0339   BUN 7 (L) 06/08/2023 0339   BUN 12 04/12/2023 0937   CREATININE 0.89 06/08/2023 0339   CREATININE 1.15  06/02/2023 0937   CALCIUM  7.4 (L) 06/08/2023 0339   PROT 5.2 (L) 06/08/2023 0339   PROT 6.5 04/12/2023 0937   ALBUMIN  2.5 (L) 06/08/2023 0339   ALBUMIN  4.0 04/12/2023 0937   AST 30 06/08/2023 0339   AST 181 (HH) 06/02/2023 0937   ALT 44 06/08/2023 0339   ALT 127 (H) 06/02/2023 0937   ALKPHOS 93 06/08/2023 0339   BILITOT 1.3 (H) 06/08/2023 0339   BILITOT 1.2 06/02/2023 0937   GFRNONAA >60 06/08/2023 0339   GFRNONAA >60 06/02/2023 0937   Lipase     Component Value Date/Time   LIPASE 25 06/07/2023 0332       Studies/Results: NM Hepatobiliary Liver Func Result Date: 06/07/2023 CLINICAL DATA:  Cholecystitis suspected. EXAM: NUCLEAR MEDICINE HEPATOBILIARY IMAGING TECHNIQUE: Sequential images of the abdomen were obtained out to 60 minutes following intravenous administration of radiopharmaceutical. RADIOPHARMACEUTICALS:  7.83 mCi Tc-5m  Choletec  IV COMPARISON:  None Available. FINDINGS: Prompt uptake and biliary excretion of activity by the liver is seen. No gallbladder activity is visualized including after administration of 3 mg of morphine  after 60 minutes of imaging. Biliary activity passes into small bowel, consistent with patent common bile duct. IMPRESSION: No uptake in the gallbladder compatible with acute cholecystitis. Electronically Signed   By: Herminia Lope  Stutzman M.D.   On: 06/07/2023 20:12   US  Abdomen Limited RUQ (LIVER/GB) Result Date: 06/06/2023 CLINICAL DATA:  Emesis EXAM: ULTRASOUND ABDOMEN LIMITED RIGHT UPPER QUADRANT COMPARISON:  CT 06/06/2023 FINDINGS: Gallbladder: Slightly distended gallbladder with shadowing stone at the neck. Slight increased wall thickness at 3.8 mm but no sonographic Murphy. Common bile duct: Diameter: 4.6 mm Liver: Echogenic liver parenchyma. Poorly visible due to habitus and bowel gas. Portal vein is patent on color Doppler imaging with normal direction of blood flow towards the liver. Other: None. IMPRESSION: 1. Cholelithiasis with slightly distended  gallbladder and slight increased wall thickness but no sonographic Murphy. If there is high clinical concern for acute cholecystitis, consider correlation with nuclear medicine hepatobiliary imaging 2. Echogenic liver parenchyma consistent with hepatic steatosis and or hepatocellular disease. Electronically Signed   By: Esmeralda Hedge M.D.   On: 06/06/2023 20:18   CT Angio Chest/Abd/Pel for Dissection W and/or Wo Contrast Result Date: 06/06/2023 CLINICAL DATA:  Acute aortic syndrome suspected. Increased white blood cell count. EXAM: CT ANGIOGRAPHY CHEST, ABDOMEN AND PELVIS TECHNIQUE: Non-contrast CT of the chest was initially obtained. Multidetector CT imaging through the chest, abdomen and pelvis was performed using the standard protocol during bolus administration of intravenous contrast. Multiplanar reconstructed images and MIPs were obtained and reviewed to evaluate the vascular anatomy. RADIATION DOSE REDUCTION: This exam was performed according to the departmental dose-optimization program which includes automated exposure control, adjustment of the mA and/or kV according to patient size and/or use of iterative reconstruction technique. CONTRAST:  OMNIPAQUE  IOHEXOL  350 MG/ML SOLN COMPARISON:  CT chest abdomen and pelvis 04/29/2023. FINDINGS: CTA CHEST FINDINGS Cardiovascular: Preferential opacification of the thoracic aorta. No evidence of thoracic aortic aneurysm or dissection. Normal heart size. No pericardial effusion. There are atherosclerotic calcifications of the aorta. Mediastinum/Nodes: No enlarged mediastinal, hilar, or axillary lymph nodes. Thyroid  gland, trachea, and esophagus demonstrate no significant findings. Lungs/Pleura: Moderate emphysematous changes are again seen. Fibrotic changes in the lower lung predominance appear similar to the prior study. Patchy ground-glass opacities in the inferior right lower lobe and airspace consolidation in the superior segment of the right lower lobe  and adjacent right upper lobe are again seen. Right upper lobe airspace consolidation has mildly increased. There is no pneumothorax or pleural effusion. Musculoskeletal: Healed rib fractures are again noted. No acute fractures are seen. T12 vertebral plasty changes are chronic fresh fracture deformity of T11 is unchanged. Review of the MIP images confirms the above findings. CTA ABDOMEN AND PELVIS FINDINGS VASCULAR Aorta: Infrarenal abdominal aortic aneurysm measuring 3.4 cm appears unchanged. There are atherosclerotic calcifications of the aorta. There is no dissection or inflammation. Celiac: Patent without evidence of aneurysm, dissection, vasculitis or significant stenosis. SMA: Patent without evidence of aneurysm, dissection, vasculitis or significant stenosis. Renals: Both renal arteries are patent without evidence of aneurysm, dissection, vasculitis, fibromuscular dysplasia or significant stenosis. IMA: Patent without evidence of aneurysm, dissection, vasculitis or significant stenosis. Inflow: Patent without evidence of aneurysm, dissection, vasculitis or significant stenosis. Calcified atherosclerotic disease is present. Veins: No obvious venous abnormality within the limitations of this arterial phase study. Review of the MIP images confirms the above findings. NON-VASCULAR Hepatobiliary: Gallbladder is distended. There is a gallstone in the gallbladder bladder neck measuring 16 mm. There is no pericholecystic inflammation. No focal liver lesions are identified. There is no biliary ductal dilatation. Pancreas: Unremarkable. No pancreatic ductal dilatation or surrounding inflammatory changes. Spleen: The spleen is mildly enlarged. Adrenals/Urinary Tract: Indeterminate right adrenal nodule measuring  2.6 cm is unchanged. Left adrenal gland is within normal limits. Left renal cyst measuring 9 mm is unchanged. There is no hydronephrosis in either kidney. The bladder is within normal limits. Stomach/Bowel:  Stomach is within normal limits. Appendix appears normal. No evidence of bowel wall thickening, distention, or inflammatory changes. Lymphatic: No enlarged lymph nodes are seen. Reproductive: Prostate gland is within normal limits. Other: There are small fat containing left inguinal and umbilical hernias. There is no ascites. Musculoskeletal: L1 and L3 vertebroplasty changes are present. Review of the MIP images confirms the above findings. IMPRESSION: 1. No evidence for thoracic aortic dissection or aneurysm. 2. Stable 3.4 cm infrarenal abdominal aortic aneurysm. Recommend follow-up ultrasound every 3 years. 3. Stable emphysema and fibrotic changes. 4. Mild increase in airspace consolidation in the right upper lobe worrisome for worsening pneumonia. 5. Cholelithiasis with distended gallbladder. No pericholecystic inflammation. 6. Stable indeterminate right adrenal nodule. 7. Mild splenomegaly. Aortic Atherosclerosis (ICD10-I70.0) and Emphysema (ICD10-J43.9). Electronically Signed   By: Tyron Gallon M.D.   On: 06/06/2023 16:12   DG Chest Port 1 View Result Date: 06/06/2023 CLINICAL DATA:  63 year old male with possible sepsis. Metastatic lung cancer. Fall yesterday. EXAM: PORTABLE CHEST 1 VIEW COMPARISON:  Restaging CT Chest, Abdomen, and Pelvis today are reported separately. 04/29/2023. FINDINGS: Portable AP semi upright view at 1024 hours. Chronically low lung volumes, slightly lower when compared to 04/29/2023 radiographs today. Stable mediastinal contours with right hilar architectural distortion. Combined right lung fibrosis and emphysema demonstrated by CT last month. No superimposed pneumothorax, pulmonary edema, pleural effusion, acute lung opacity. Visualized tracheal air column is within normal limits. Stable visualized osseous structures. Paucity of bowel gas. IMPRESSION: No acute cardiopulmonary abnormality; low lung volumes with underlying emphysema and post treatment right lung fibrosis.  Electronically Signed   By: Marlise Simpers M.D.   On: 06/06/2023 12:41   MR Lumbar Spine W Wo Contrast Result Date: 06/06/2023 CLINICAL DATA:  63 year old male with stage IV lung cancer. Fall yesterday. Low back pain. EXAM: MRI LUMBAR SPINE WITHOUT AND WITH CONTRAST TECHNIQUE: Multiplanar and multiecho pulse sequences of the lumbar spine were obtained without and with intravenous contrast. CONTRAST:  10mL GADAVIST  GADOBUTROL  1 MMOL/ML IV SOLN COMPARISON:  Lumbar MRI 08/20/2021.  Lumbar spine CT 10/02/2021. FINDINGS: Segmentation:  Normal on the comparison CT. Alignment: Lumbar lordosis not significantly changed from the 2023 CT. Vertebrae: Chronic, and several previously augmented, compression fractures T11 through L5. The T11 compression is new since 10/02/2021, but chronic with absent marrow edema. The other levels appear stable since 10/02/2021. Background bone marrow signal is within normal limits. Visible sacrum and SI joints are intact. No convincing marrow edema or acute osseous abnormality. Conus medullaris and cauda equina: Conus extends to the T11 level. Conu No lower spinal cord or conus signal abnormality. No abnormal intradural enhancement. No dural thickening. Paraspinal and other soft tissues: Infrarenal abdominal aortic aneurysm, approximately 36 mm diameter now on series 9, image 12. This compares to 34 mm on 10/02/2021. Negative visualized posterior paraspinal soft tissues. Disc levels: Spinal stenosis throughout the lumbar spine related to disc bulging and mild retropulsion. This is mild to moderate at most levels. But moderate to severe multifactorial at the L4-L5 level. IMPRESSION: 1. Diffuse lower thoracic and lumbar compression fractures are chronic and appear likely to be benign. No acute or metastatic process identified in the lumbar spine. 2. Diffuse multifactorial lumbar spinal stenosis, moderate to severe at L4-L5. 3. Abdominal aortic aneurysm measuring 3.6 cm. Recommend surveillance  ultrasound in 3 years. Reference: Journal of Vascular Surgery 67.1 (2018): 2-77. J Am Coll Radiol [phone]. Electronically Signed   By: Marlise Simpers M.D.   On: 06/06/2023 12:39    Anti-infectives: Anti-infectives (From admission, onward)    Start     Dose/Rate Route Frequency Ordered Stop   06/07/23 1600  azithromycin  (ZITHROMAX ) 500 mg in sodium chloride  0.9 % 250 mL IVPB        500 mg 250 mL/hr over 60 Minutes Intravenous Every 24 hours 06/07/23 1339     06/07/23 1200  cefTRIAXone  (ROCEPHIN ) 2 g in sodium chloride  0.9 % 100 mL IVPB        2 g 200 mL/hr over 30 Minutes Intravenous Every 24 hours 06/07/23 1112     06/06/23 1700  metroNIDAZOLE  (FLAGYL ) IVPB 500 mg        500 mg 100 mL/hr over 60 Minutes Intravenous  Once 06/06/23 1648 06/06/23 1934   06/06/23 1630  cefTRIAXone  (ROCEPHIN ) 1 g in sodium chloride  0.9 % 100 mL IVPB        1 g 200 mL/hr over 30 Minutes Intravenous  Once 06/06/23 1618 06/06/23 1716   06/06/23 1630  azithromycin  (ZITHROMAX ) 500 mg in sodium chloride  0.9 % 250 mL IVPB        500 mg 250 mL/hr over 60 Minutes Intravenous  Once 06/06/23 1618 06/06/23 1826        Assessment/Plan  Cholelithiasis and probable acute cholecystitis  - RUQ US  with stone in the neck of the gallbladder and mild gallbladder wall thickening, hepatic steatosis  - pt is neutropenic, afebrile  - Tbili 4.7 on admit, down to 1.3 today; Alk phos was 142 and now normalized, AST/ALT 61/87 and now 30/44 - MRCP pending  - last dose eliquis  4/21 AM, continue to hold - last dose Gemzar /Navelbine  4/10, finished XRT for right adrenal 04/04/23 - HIDA consistent with acute cholecystitis  - Pending MD evaluation and discussion with patient may potentially proceed with OR today vs tomorrow. 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 Fib on Eliquis  Anxiety and depression   LOS: 2 days   I reviewed Consultant GI notes, hospitalist notes, last 24 h vitals and pain scores, last 48 h intake and output, last 24 h labs and trends, and last 24 h imaging results.  This care required high  level of medical decision making.    Annetta Killian, [facility] 06/08/2023, 10:18 AM Please see Amion for pager number during day hours 7:00am-4:30pm

## 2023-06-08 NOTE — Progress Notes (Signed)
 PT Cancellation Note  Patient Details Name: John [name] MRN: [id number] DOB: Feb 08, 1961   Cancelled Treatment:    Reason Eval/Treat Not Completed: Patient at procedure or test/unavailable;  pt in surgery, will continue efforts to complete PT eval   [facility] 06/08/2023, 1:58 PM

## 2023-06-08 NOTE — Anesthesia Postprocedure Evaluation (Signed)
 Anesthesia Post Note  Patient: John [name]  Procedure(s) Performed: LAPAROSCOPIC CHOLECYSTECTOMY     Patient location during evaluation: PACU Anesthesia Type: General Level of consciousness: awake and alert Pain management: pain level controlled Vital Signs Assessment: post-procedure vital signs reviewed and stable Respiratory status: spontaneous breathing, nonlabored ventilation, respiratory function stable and patient connected to nasal cannula oxygen Cardiovascular status: blood pressure returned to baseline and stable Postop Assessment: no apparent nausea or vomiting Anesthetic complications: no  No notable events documented.  Last Vitals:  Vitals:   06/08/23 1748 06/08/23 2015  BP: 108/83 107/74  Pulse: 74 77  Resp: 17 16  Temp: (!) 36.3 C (!) 36.4 C  SpO2: 98% 98%    Last Pain:  Vitals:   06/08/23 2015  TempSrc: Oral  PainSc:                  John [name]

## 2023-06-08 NOTE — Progress Notes (Addendum)
  Progress Note   Patient: John [name] WJX:[id number] DOB: 1960/06/23 DOA: 06/06/2023     2 DOS: the patient was seen and examined on 06/08/2023   Brief hospital course: 63 year old with cholecystitis   Consultants General Surgery  Procedures/Events 4/23 cholecystectomy   Assessment and Plan: Acute cholecystitis Status post surgery 4/23  Possible pneumonia Complete antibiotics.  Pancytopenia Platelets stable, hemoglobin stable, WBC improved.   Chronic back pain   Paroxysmal atrial fibrillation:  Resume Eliquis  when okay with surgery. 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  Constitutional:      General: He is not in acute distress.    Appearance: He is not ill-appearing or toxic-appearing.  Cardiovascular:     Rate and Rhythm: Normal rate and regular rhythm.     Heart sounds: No murmur heard. Pulmonary:     Effort: Pulmonary effort is normal. No respiratory distress.     Breath sounds: No wheezing, rhonchi or rales.  Neurological:     Mental Status: He is alert.  Psychiatric:        Behavior: Behavior normal.     Data Reviewed: CMP noted, total bilirubin now near normal BBC up to 3.4 Hemoglobin stable 10.0 Platelets up to 128  Family Communication: none   Disposition: Status is: Inpatient Remains inpatient appropriate because: s/p surgery     Time spent: 20 minutes  Author: Jerline Moon, MD 06/08/2023 5:15 PM  For on call review [email or web address].

## 2023-06-08 NOTE — Anesthesia Preprocedure Evaluation (Addendum)
 Anesthesia Evaluation  Patient identified by MRN, date of birth, ID band Patient awake    Reviewed: Allergy & Precautions, NPO status , Patient's Chart, lab work & pertinent test results  History of Anesthesia Complications (+) Family history of anesthesia reactionHistory of anesthetic complications: brother had seizures under anesthesia.  Airway Mallampati: II  TM Distance: >3 FB Neck ROM: Full   Comment: Previous grade I view with MAC 3, easy mask Dental no notable dental hx. (+) Teeth Intact, Dental Advisory Given   Pulmonary asthma , pneumonia, unresolved, COPD,  COPD inhaler, former smoker Lung cancer on chemo   Pulmonary exam normal breath sounds clear to auscultation       Cardiovascular + CAD (non-obstructive)  Normal cardiovascular exam+ dysrhythmias (on amiodarone  and metoprolol , incomplete RBBB) Atrial Fibrillation  Rhythm:Regular Rate:Normal  HLD, PFO, Stable 3.4 cm infrarenal abdominal aortic aneurysm  TTE 04/01/2023: IMPRESSIONS    1. Left ventricular ejection fraction, by estimation, is 60 to 65%. The  left ventricle has normal function. The left ventricle has no regional  wall motion abnormalities. Left ventricular diastolic parameters are  consistent with Grade I diastolic  dysfunction (impaired relaxation).   2. Right ventricular systolic function is normal. The right ventricular  size is normal.   3. The mitral valve is normal in structure. No evidence of mitral valve  regurgitation. No evidence of mitral stenosis.   4. The aortic valve is tricuspid. Aortic valve regurgitation is not  visualized. No aortic stenosis is present.   5. The inferior vena cava is normal in size with greater than 50%  respiratory variability, suggesting right atrial pressure of 3 mmHg.     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  Renal/GU      Musculoskeletal  (+) Arthritis , Rheumatoid disorders,    Abdominal   Peds  Hematology  (+) Blood dyscrasia (neutropenic), anemia Lab Results      Component                Value               Date                      WBC                      3.4 (L)             06/08/2023                HGB                      10.0 (L)            06/08/2023                HCT                      31.1 (L)            06/08/2023                MCV  89.9                06/08/2023                PLT                      128 (L)             06/08/2023              Anesthesia Other Findings Last Eliquis :  Fentanyl  patch 25 mcg/h  Takes prednisone  5 mg   Reproductive/Obstetrics                             Anesthesia Physical Anesthesia Plan  ASA: 3 and emergent  Anesthesia Plan: General   Post-op Pain Management: Tylenol  PO (pre-op)*   Induction: Intravenous  PONV Risk Score and Plan: 2 and Ondansetron  and Treatment may vary due to age or medical condition  Airway Management Planned: Oral ETT  Additional Equipment: None  Intra-op Plan:   Post-operative Plan: Post-operative intubation/ventilation  Informed Consent: I have reviewed the patients History and Physical, chart, labs and discussed the procedure including the risks, benefits and alternatives for the proposed anesthesia with the patient or authorized representative who has indicated his/her understanding and acceptance.     Dental advisory given  Plan Discussed with: CRNA and Anesthesiologist  Anesthesia Plan Comments: (Risks of general anesthesia discussed including, but not limited to, sore throat, hoarse voice, chipped/damaged teeth, injury to vocal cords, nausea and vomiting,  allergic reactions, lung infection, heart attack, stroke, and death. All questions answered. )        Anesthesia Quick Evaluation

## 2023-06-08 NOTE — Hospital Course (Addendum)
 63 year old with cholecystitis   Consultants General Surgery  Procedures/Events 4/23 cholecystectomy

## 2023-06-08 NOTE — Progress Notes (Signed)
 John [name] 10:50 AM  Subjective: Patient seen and examined and discussed with the hospital team and he has no new symptoms but his pain is unchanged and we discussed his x-ray results  Objective: Vital signs stable afebrile no acute distress abdomen has some right upper quadrant midepigastric discomfort but soft no guarding or rebound LFTs back to normal white count and platelet count improved HIDA positive for acute cholecystitis MRI report pending however CBD looks okay to me  Assessment: Probable pain of gallbladder etiology  Plan: Plans per surgical team he really does not want a drain and supposedly oncology is okay with proceeding with surgery and I will review final radiology MRI report and be on standby if IntraOp cholangiogram is positive or if any other GI question or problem and I wished him well [facility]  office [phone] After 5PM or if no answer call [phone]

## 2023-06-08 NOTE — Anesthesia Procedure Notes (Signed)
 Procedure Name: Intubation Date/Time: 06/08/2023 1:02 PM  Performed by: Hunter Maha, CRNAPre-anesthesia Checklist: Patient identified, Emergency Drugs available, Suction available and Patient being monitored Patient Re-evaluated:Patient Re-evaluated prior to induction Oxygen Delivery Method: Circle system utilized Preoxygenation: Pre-oxygenation with 100% oxygen Induction Type: IV induction Ventilation: Mask ventilation without difficulty Laryngoscope Size: Glidescope and 4 Grade View: Grade I Tube type: Oral Tube size: 7.5 mm Number of attempts: 1 Airway Equipment and Method: Stylet and Oral airway Placement Confirmation: ETT inserted through vocal cords under direct vision, positive ETCO2 and breath sounds checked- equal and bilateral Secured at: 22 cm Tube secured with: Tape Dental Injury: Teeth and Oropharynx as per pre-operative assessment

## 2023-06-08 NOTE — Op Note (Signed)
 Patient: John [name] (December 24, 1960, [id number])  Date of Surgery: 06/08/2023  Preoperative Diagnosis: ACUTE CHOLECYSTITIS   Postoperative Diagnosis: ACUTE CHOLECYSTITIS   Surgical Procedure: LAPAROSCOPIC CHOLECYSTECTOMY:    Operative Team Members:  Surgeons and Role:    * Kayshaun Polanco, Avon Boers, MD - Primary   Anesthesiologist: Rosalita Combe, MD CRNA: Hunter Maha, CRNA   Anesthesia: General   Fluids:  No intake/output data recorded.  Complications: * No complications entered in OR log *  Drains:  none   Specimen:  ID Type Source Tests Collected by Time Destination  1 : Gallbladder Tissue PATH Gallbladder SURGICAL PATHOLOGY Janalynn Eder, Avon Boers, MD 06/08/2023 1341      Disposition:  PACU - hemodynamically stable.  Plan of Care:  Continue inpatient care.    Indications for Procedure: John [name] is a 63 y.o. male who presented with abdominal pain.  History, physical and imaging was concerning for cholecystitis.  We discussed percutaneous drain vs. Laparoscopic posssibly open cholecystectomy. He is not interested in a drain. We discussed the risks of surgery including possible need for drain and that he is higher risk due to medical issues. He voiced understanding. After a full discussion and all questions answered the patient granted consent to proceed.   Laparoscopic cholecystectomy was recommended for the patient.  The procedure itself, as well as the risks, benefits and alternatives were discussed with the patient.  Risks discussed included but were not limited to the risk of infection, bleeding, damage to nearby structures, need to convert to open procedure, incisional hernia, bile leak, common bile duct injury and the need for additional procedures or surgeries.  With this discussion complete and all questions answered the patient granted consent to proceed.  Findings: Hydrops.  Snow left in gallbladder fossa  Infection status: Patient: John [name] Emergency  General Surgery Service Patient Case: Urgent Infection Present At Time Of Surgery (PATOS):  Inflamed gallbladder with some spillage of hydrops fluid   Description of Procedure:   On the date stated above, the patient was taken to the operating room suite and placed in supine positioning.  Sequential compression devices were placed on the lower extremities to prevent blood clots.  General endotracheal anesthesia was induced. Preoperative antibiotics were given.  The patient's abdomen was prepped and draped in the usual sterile fashion.  A time-out was completed verifying the correct patient, procedure, positioning and equipment needed for the case.  We began by anesthetizing the skin with local anesthetic and then making a 12 mm incision just above the umbilicus.  We dissected through the subcutaneous tissues to the fascia.  The fascia was grasped and elevated using a Kocher clamp.  A Veress needle was inserted into the abdomen and the abdomen was insufflated to 15 mmHg.  A 12 mm trocar was inserted in this position under optical guidance and then the abdomen was inspected.  There was no trauma to the underlying viscera with initial trocar placement.  Any abnormal findings, other than inflammation in the right upper quadrant, are listed above in the findings section.  Three additional trocars were placed, one 12 mm trocar in the subxiphoid position, one 5 mm trocar in the midline epigastric area and one 5mm trocar in the right upper quadrant subcostally.  These were placed under direct vision without any trauma to the underlying viscera.    The patient was then placed in head up, left side down positioning.  The gallbladder was identified and dissected free from its attachments to the omentum allowing  the duodenum to fall away.  The infundibulum of the gallbladder was dissected free working laterally to medially.  The cystic duct and cystic artery were dissected free from surrounding connective tissue.   The infundibulum of the gallbladder was dissected off the cystic plate.  A critical view of safety was obtained with the cystic duct and cystic artery being cleared of connective tissues and clearly the only two structures entering into the gallbladder with the liver clearly visible behind.  Clips were then applied to the cystic duct and cystic artery and then these structures were divided.  A PDS endoloop was placed on the cystic duct stump.   The gallbladder was dissected off the cystic plate, placed in an endocatch bag and removed from the 12 mm subxiphoid port site.  The clips were inspected and appeared effective.  The cystic plate was inspected and hemostasis was obtained using electrocautery.  A suction irrigator was used to clean the operative field. Tari Fare was placed in the gallbladder fossa to ensure hemostasis due to his thrombocytopenia.  Attention was turned to closure.  The 12 mm subxiphoid port site and 12 mm trocar at the umbilicus was closed using a 0-vicryl suture on a fascial suture passer.  The abdomen was desufflated.  The skin was closed using 4-0 monocryl and dermabond.  All sponge and needle counts were correct at the conclusion of the case.    Teddie Favre, MD General, Bariatric, & Minimally Invasive Surgery [facility], [place]

## 2023-06-08 NOTE — Transfer of Care (Signed)
 Immediate Anesthesia Transfer of Care Note  Patient: John [name]  Procedure(s) Performed: LAPAROSCOPIC CHOLECYSTECTOMY  Patient Location: PACU  Anesthesia Type:General  Level of Consciousness: awake and alert   Airway & Oxygen Therapy: Patient Spontanous Breathing and Patient connected to nasal cannula oxygen  Post-op Assessment: Report given to RN and Post -op Vital signs reviewed and stable  Post vital signs: Reviewed and stable  Last Vitals:  Vitals Value Taken Time  BP 138/85 06/08/23 1422  Temp    Pulse 89 06/08/23 1423  Resp 19 06/08/23 1423  SpO2 95 % 06/08/23 1423  Vitals shown include unfiled device data.  Last Pain:  Vitals:   06/08/23 1205  TempSrc: Oral  PainSc:       Patients Stated Pain Goal: 2 (06/08/23 1202)  Complications: No notable events documented.

## 2023-06-08 NOTE — Discharge Instructions (Signed)

## 2023-06-08 NOTE — Progress Notes (Signed)
 OT Cancellation Note  Patient Details Name: John [name] MRN: [id number] DOB: 06-14-1960   Cancelled Treatment:    Reason Eval/Treat Not Completed: Other (comment)  Patient having surgery currently. OT will re-attempt OT evaluation when medically stable.   Ashanty Coltrane OT/L Acute Rehabilitation Department  [phone]    06/08/2023, 2:06 PM

## 2023-06-09 ENCOUNTER — Other Ambulatory Visit: Payer: Self-pay

## 2023-06-09 ENCOUNTER — Encounter (HOSPITAL_COMMUNITY): Payer: Self-pay | Admitting: Surgery

## 2023-06-09 DIAGNOSIS — K810 Acute cholecystitis: Secondary | ICD-10-CM

## 2023-06-09 DIAGNOSIS — D61818 Other pancytopenia: Secondary | ICD-10-CM | POA: Diagnosis not present

## 2023-06-09 LAB — CBC WITH DIFFERENTIAL/PLATELET
Abs Immature Granulocytes: 0.19 10*3/uL — ABNORMAL HIGH (ref 0.00–0.07)
Basophils Absolute: 0 10*3/uL (ref 0.0–0.1)
Basophils Relative: 0 %
Eosinophils Absolute: 0 10*3/uL (ref 0.0–0.5)
Eosinophils Relative: 0 %
HCT: 30.3 % — ABNORMAL LOW (ref 39.0–52.0)
Hemoglobin: 9.4 g/dL — ABNORMAL LOW (ref 13.0–17.0)
Immature Granulocytes: 2 %
Lymphocytes Relative: 4 %
Lymphs Abs: 0.3 10*3/uL — ABNORMAL LOW (ref 0.7–4.0)
MCH: 29 pg (ref 26.0–34.0)
MCHC: 31 g/dL (ref 30.0–36.0)
MCV: 93.5 fL (ref 80.0–100.0)
Monocytes Absolute: 0.9 10*3/uL (ref 0.1–1.0)
Monocytes Relative: 12 %
Neutro Abs: 6.4 10*3/uL (ref 1.7–7.7)
Neutrophils Relative %: 82 %
Platelets: 140 10*3/uL — ABNORMAL LOW (ref 150–400)
RBC: 3.24 MIL/uL — ABNORMAL LOW (ref 4.22–5.81)
RDW: 17 % — ABNORMAL HIGH (ref 11.5–15.5)
WBC: 7.8 10*3/uL (ref 4.0–10.5)
nRBC: 0.4 % — ABNORMAL HIGH (ref 0.0–0.2)

## 2023-06-09 LAB — COMPREHENSIVE METABOLIC PANEL WITH GFR
ALT: 52 U/L — ABNORMAL HIGH (ref 0–44)
AST: 56 U/L — ABNORMAL HIGH (ref 15–41)
Albumin: 2.7 g/dL — ABNORMAL LOW (ref 3.5–5.0)
Alkaline Phosphatase: 100 U/L (ref 38–126)
Anion gap: 9 (ref 5–15)
BUN: 8 mg/dL (ref 8–23)
CO2: 23 mmol/L (ref 22–32)
Calcium: 7.5 mg/dL — ABNORMAL LOW (ref 8.9–10.3)
Chloride: 103 mmol/L (ref 98–111)
Creatinine, Ser: 0.89 mg/dL (ref 0.61–1.24)
GFR, Estimated: >60 mL/min (ref >60)
Glucose, Bld: 145 mg/dL — ABNORMAL HIGH (ref 70–99)
Potassium: 4.3 mmol/L (ref 3.5–5.1)
Sodium: 135 mmol/L (ref 135–145)
Total Bilirubin: 0.8 mg/dL (ref 0.0–1.2)
Total Protein: 5.7 g/dL — ABNORMAL LOW (ref 6.5–8.1)

## 2023-06-09 MED ORDER — POLYETHYLENE GLYCOL 3350 17 G PO PACK
17.0000 g | PACK | Freq: Every day | ORAL | Status: AC | PRN
Start: 2023-06-09 — End: ?

## 2023-06-09 NOTE — Progress Notes (Signed)
 AVS reviewed w/ pt  who verbalized an understanding. Pt dressed for D/C - ride waiting at main entrance - Pt denies any other questions- pt to lobby via w/c

## 2023-06-09 NOTE — Discharge Summary (Addendum)
 Physician Discharge Summary   Patient: John [name] MRN: [id number] DOB: 10-04-1960  Admit date:     06/06/2023  Discharge date: 06/09/23  Discharge Physician: Jerline Moon   PCP: Dorthy Gavia, MD   Recommendations at discharge:   Recovery from cholecystitis, surgery Ongoing care for cancer Pending confirmatory testing for positive screening test for HIV, suspect false positive, see discussion below Abdominal aortic aneurysm measuring 3.6 cm. 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 Recommend surveillance ultrasound in 3 years Aortic atherosclerosis Emphysema Prolonged QT  Resolved Problems:   * No resolved hospital problems. *  Hospital Course: 63 year old man PMH including stage IV squamous cell carcinoma right lung on chemotherapy, PAF on anticoagulation, presented with abdominal pain, nausea, vomiting and back pain after a fall.  Admitted for abdominal pain radiating to his back, incidental finding of gallbladder neck stone.  Further workup revealed acute cholecystitis, after further consideration he underwent operative intervention.   Consultants General Surgery Oncology Gastroenterology  Procedures/Events 4/23 cholecystectomy  Acute cholecystitis Cholelithiasis Gallbladder neck stone Status post surgery 4/23 Doing well, cleared by surgery for discharged   Possible pneumonia Completed antibiotics -- doubt dx   Pancytopenia Platelets stable, hemoglobin stable, WBC normalized.   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  No laboratory evidence of HIV infection.  Possible biologic false positive." Confirmatory test in process, discussed with patient, expect this will be a false positive again   Abdominal aortic aneurysm measuring 3.6 cm. Recommend surveillance ultrasound in 3 years   Aortic atherosclerosis Emphysema Prolonged QT  Obesity class I  Body mass index is 34.87 kg/m.  Acute respiratory failure has been ruled out, Patient with chronic hypoxic respiratory failure only   Disposition: Home Diet recommendation:  Discharge Diet Orders (From admission, onward)     Start     Ordered   06/09/23 0000  Diet - low sodium heart healthy        06/09/23 1507           Regular diet DISCHARGE MEDICATION: Allergies as of 06/09/2023       Reactions   Ventolin  [albuterol ] Other (See Comments)   Patient has had episode of atrial fib following administration of albuterol  (tolerates Xopenex  well)   Lipitor [atorvastatin ] Other (See Comments)   Arthralgias    Oxycodone  Other (See Comments)   Severe Migranes        Medication List     STOP taking these medications    ciprofloxacin  500 MG tablet Commonly known as: Cipro    fentaNYL  25 MCG/HR Commonly known as: DURAGESIC    HYDROcodone  bit-homatropine 5-1.5 MG/5ML syrup Commonly known as: Hydromet   Keytruda  100 MG/4ML Soln Generic drug: pembrolizumab    methimazole  5 MG tablet Commonly known as: TAPAZOLE        TAKE these medications    acetaminophen  500 MG tablet Commonly known as: TYLENOL  Take 500 mg by mouth daily  as needed for moderate pain (pain score 4-6), fever or headache.   amiodarone  200 MG  tablet Commonly known as: PACERONE  START by taking 2 tablets (400mg ) daily for 2 weeks THEN reduce to 1 tablet (200mg ) daily What changed: how much to take   benzonatate  200 MG capsule Commonly known as: TESSALON  Take 1 capsule (200 mg total) by mouth 3 (three) times daily as needed.   Eliquis  5 MG Tabs tablet Generic drug: apixaban  TAKE 1 TABLET(5 MG) BY MOUTH TWICE DAILY What changed: See the new instructions.   lidocaine  5 % Commonly known as: LIDODERM  Place 1 patch onto the skin daily. Remove & Discard patch within 12 hours or as directed by MD   metoprolol  succinate 25 MG 24 hr tablet Commonly known as: TOPROL -XL TAKE 1 TABLET(25 MG) BY MOUTH DAILY   ondansetron  8 MG tablet Commonly known as: ZOFRAN  Take 1 tablet (8 mg total) by mouth every 8 (eight) hours as needed for nausea or vomiting.   oxyCODONE -acetaminophen  5-325 MG tablet Commonly known as: PERCOCET/ROXICET Take 2 tablets by mouth every 6 (six) hours as needed.   PARoxetine  10 MG tablet Commonly known as: PAXIL  Take 1 tablet (10 mg total) by mouth at bedtime.   polyethylene glycol 17 g packet Commonly known as: MIRALAX  / GLYCOLAX  Take 17 g by mouth daily as needed for mild constipation.   predniSONE  5 MG tablet Commonly known as: DELTASONE  Take 1 tablet (5 mg total) by mouth daily with breakfast.   prochlorperazine  10 MG tablet Commonly known as: COMPAZINE  Take 1 tablet (10 mg total) by mouth every 6 (six) hours as needed for nausea or vomiting.   Repatha  SureClick 140 MG/ML Soaj Generic drug: Evolocumab  ADMINISTER 1 ML UNDER THE SKIN EVERY 14 DAYS   Trelegy Ellipta  100-62.5-25 MCG/ACT Aepb Generic drug: Fluticasone -Umeclidin-Vilant INHALE 1 PUFF INTO THE LUNGS DAILY        Follow-up Information     Maczis, Puja Gosai, PA-C. Go on 07/05/2023.   Specialty: General Surgery Why: 9:15 AM for post-op follow up. Please arrive 30 min prior to appointment time and have ID and insurance card with  you. Contact information: [address] [facility] [place] [address] [phone]                Discharge Exam: Filed Weights   06/06/23 0957 06/08/23 1202  Weight: 113.4 kg 113.4 kg   Physical Exam Vitals reviewed.  Constitutional:      General: He is not in acute distress.    Appearance: He is not ill-appearing or toxic-appearing.  Cardiovascular:     Rate and Rhythm: Normal rate and regular rhythm.     Heart sounds: No murmur heard. Pulmonary:     Effort: Pulmonary effort is normal. No respiratory distress.     Breath sounds: No wheezing, rhonchi or rales.  Neurological:     Mental Status: He is alert.  Psychiatric:        Mood and Affect: Mood normal.        Behavior: Behavior normal.      Condition at discharge: good  The results of significant diagnostics from this hospitalization (including imaging, microbiology, ancillary and laboratory) are listed below for reference.   Imaging Studies: MR ABDOMEN MRCP W WO CONTAST Result Date: 06/08/2023 CLINICAL DATA:  Cholecystitis, biliary obstruction. EXAM: MRI ABDOMEN WITHOUT AND WITH CONTRAST (INCLUDING MRCP) TECHNIQUE: Multiplanar multisequence MR imaging of the abdomen was performed both before and after the administration of  intravenous contrast. Heavily T2-weighted images of the biliary and pancreatic ducts were obtained, and three-dimensional MRCP images were rendered by post processing. CONTRAST:  10mL GADAVIST  GADOBUTROL  1 MMOL/ML IV SOLN COMPARISON:  Abdominal ultrasound 06/06/2023 FINDINGS: Lower chest: Consolidation in the right lung especially in the right lower lobe with interstitial accentuation the left lung base. Hepatobiliary: 3.1 cm gallstone in the neck of the gallbladder. No biliary dilatation or findings of choledocholithiasis, to the extent that this can be assessed based on motion artifact. No pericholecystic fluid although there is some trace fluid along the  inferior margin of the right hepatic lobe. Pancreas: Fatty atrophy of the pancreas. Spleen: The spleen measures 14.2 by 13.9 by 8.0 cm (volume = 830 cm^3), compatible with splenomegaly. Small bandlike hypodense areas of the peripheral spleen for example on image 57 series 18, similar to prior exams and likely incidental. Adrenals/Urinary Tract: Right adrenal lesion 3.3 by 2.3 cm, much of this appears to represent a complex cystic component which does not appreciably enhance. However, a component of this process demonstrated accentuated metabolic activity on 02/11/2023 compatible with metastatic lesion in this patient with known lung cancer. Currently the enhancing portion of this lesion is minimal in size. Left adrenal gland unremarkable.  No significant renal abnormality. Stomach/Bowel: Unremarkable Vascular/Lymphatic: 3.3 cm infrarenal abdominal aortic aneurysm as shown previously. Other: Trace ascites along the inferior margin of the right hepatic lobe. Musculoskeletal: Prior compression fractures at all levels between T12 and L3 with vertebral augmentations at T12, L1, and L3. IMPRESSION: 1. 3.1 cm gallstone in the neck of the gallbladder. No biliary dilatation or findings of choledocholithiasis, to the extent that this can be assessed based on motion artifact. 2. Trace ascites along the inferior margin of the right hepatic lobe. 3. Right adrenal lesion 3.3 by 2.3 cm, much of this appears to represent a complex cystic component which does not appreciably enhance. However, a component of this process demonstrated accentuated metabolic activity on 02/11/2023 compatible with metastatic lesion in this patient with known lung cancer. 4. Consolidation in the right lung especially in the right lower lobe with interstitial accentuation the left lung base. 5. 3.3 cm infrarenal abdominal aortic aneurysm. Recommend follow-up ultrasound every 3 years. This recommendation follows ACR consensus guidelines: White Paper of the  ACR Incidental Findings Committee II on Vascular Findings. J Am Coll Radiol 2013; 10:789-794. 6. Splenomegaly. 7. Prior compression fractures at all levels between T12 and L3 with vertebral augmentations at T12, L1, and L3. Electronically Signed   By: Freida Jes M.D.   On: 06/08/2023 11:00   MR 3D Recon At Scanner Result Date: 06/08/2023 CLINICAL DATA:  Cholecystitis, biliary obstruction. EXAM: MRI ABDOMEN WITHOUT AND WITH CONTRAST (INCLUDING MRCP) TECHNIQUE: Multiplanar multisequence MR imaging of the abdomen was performed both before and after the administration of intravenous contrast. Heavily T2-weighted images of the biliary and pancreatic ducts were obtained, and three-dimensional MRCP images were rendered by post processing. CONTRAST:  10mL GADAVIST  GADOBUTROL  1 MMOL/ML IV SOLN COMPARISON:  Abdominal ultrasound 06/06/2023 FINDINGS: Lower chest: Consolidation in the right lung especially in the right lower lobe with interstitial accentuation the left lung base. Hepatobiliary: 3.1 cm gallstone in the neck of the gallbladder. No biliary dilatation or findings of choledocholithiasis, to the extent that this can be assessed based on motion artifact. No pericholecystic fluid although there is some trace fluid along the inferior margin of the right hepatic lobe. Pancreas: Fatty atrophy of the pancreas. Spleen: The spleen measures 14.2 by 13.9 by  8.0 cm (volume = 830 cm^3), compatible with splenomegaly. Small bandlike hypodense areas of the peripheral spleen for example on image 57 series 18, similar to prior exams and likely incidental. Adrenals/Urinary Tract: Right adrenal lesion 3.3 by 2.3 cm, much of this appears to represent a complex cystic component which does not appreciably enhance. However, a component of this process demonstrated accentuated metabolic activity on 02/11/2023 compatible with metastatic lesion in this patient with known lung cancer. Currently the enhancing portion of this lesion is  minimal in size. Left adrenal gland unremarkable.  No significant renal abnormality. Stomach/Bowel: Unremarkable Vascular/Lymphatic: 3.3 cm infrarenal abdominal aortic aneurysm as shown previously. Other: Trace ascites along the inferior margin of the right hepatic lobe. Musculoskeletal: Prior compression fractures at all levels between T12 and L3 with vertebral augmentations at T12, L1, and L3. IMPRESSION: 1. 3.1 cm gallstone in the neck of the gallbladder. No biliary dilatation or findings of choledocholithiasis, to the extent that this can be assessed based on motion artifact. 2. Trace ascites along the inferior margin of the right hepatic lobe. 3. Right adrenal lesion 3.3 by 2.3 cm, much of this appears to represent a complex cystic component which does not appreciably enhance. However, a component of this process demonstrated accentuated metabolic activity on 02/11/2023 compatible with metastatic lesion in this patient with known lung cancer. 4. Consolidation in the right lung especially in the right lower lobe with interstitial accentuation the left lung base. 5. 3.3 cm infrarenal abdominal aortic aneurysm. Recommend follow-up ultrasound every 3 years. This recommendation follows ACR consensus guidelines: White Paper of the ACR Incidental Findings Committee II on Vascular Findings. J Am Coll Radiol 2013; 10:789-794. 6. Splenomegaly. 7. Prior compression fractures at all levels between T12 and L3 with vertebral augmentations at T12, L1, and L3. Electronically Signed   By: Freida Jes M.D.   On: 06/08/2023 11:00   NM Hepatobiliary Liver Func Result Date: 06/07/2023 CLINICAL DATA:  Cholecystitis suspected. EXAM: NUCLEAR MEDICINE HEPATOBILIARY IMAGING TECHNIQUE: Sequential images of the abdomen were obtained out to 60 minutes following intravenous administration of radiopharmaceutical. RADIOPHARMACEUTICALS:  7.83 mCi Tc-50m  Choletec  IV COMPARISON:  None Available. FINDINGS: Prompt uptake and biliary  excretion of activity by the liver is seen. No gallbladder activity is visualized including after administration of 3 mg of morphine  after 60 minutes of imaging. Biliary activity passes into small bowel, consistent with patent common bile duct. IMPRESSION: No uptake in the gallbladder compatible with acute cholecystitis. Electronically Signed   By: Rozell Cornet M.D.   On: 06/07/2023 20:12   US  Abdomen Limited RUQ (LIVER/GB) Result Date: 06/06/2023 CLINICAL DATA:  Emesis EXAM: ULTRASOUND ABDOMEN LIMITED RIGHT UPPER QUADRANT COMPARISON:  CT 06/06/2023 FINDINGS: Gallbladder: Slightly distended gallbladder with shadowing stone at the neck. Slight increased wall thickness at 3.8 mm but no sonographic Murphy. Common bile duct: Diameter: 4.6 mm Liver: Echogenic liver parenchyma. Poorly visible due to habitus and bowel gas. Portal vein is patent on color Doppler imaging with normal direction of blood flow towards the liver. Other: None. IMPRESSION: 1. Cholelithiasis with slightly distended gallbladder and slight increased wall thickness but no sonographic Murphy. If there is high clinical concern for acute cholecystitis, consider correlation with nuclear medicine hepatobiliary imaging 2. Echogenic liver parenchyma consistent with hepatic steatosis and or hepatocellular disease. Electronically Signed   By: Esmeralda Hedge M.D.   On: 06/06/2023 20:18   CT Angio Chest/Abd/Pel for Dissection W and/or Wo Contrast Result Date: 06/06/2023 CLINICAL DATA:  Acute aortic syndrome suspected.  Increased white blood cell count. EXAM: CT ANGIOGRAPHY CHEST, ABDOMEN AND PELVIS TECHNIQUE: Non-contrast CT of the chest was initially obtained. Multidetector CT imaging through the chest, abdomen and pelvis was performed using the standard protocol during bolus administration of intravenous contrast. Multiplanar reconstructed images and MIPs were obtained and reviewed to evaluate the vascular anatomy. RADIATION DOSE REDUCTION: This exam was  performed according to the departmental dose-optimization program which includes automated exposure control, adjustment of the mA and/or kV according to patient size and/or use of iterative reconstruction technique. CONTRAST:  OMNIPAQUE  IOHEXOL  350 MG/ML SOLN COMPARISON:  CT chest abdomen and pelvis 04/29/2023. FINDINGS: CTA CHEST FINDINGS Cardiovascular: Preferential opacification of the thoracic aorta. No evidence of thoracic aortic aneurysm or dissection. Normal heart size. No pericardial effusion. There are atherosclerotic calcifications of the aorta. Mediastinum/Nodes: No enlarged mediastinal, hilar, or axillary lymph nodes. Thyroid  gland, trachea, and esophagus demonstrate no significant findings. Lungs/Pleura: Moderate emphysematous changes are again seen. Fibrotic changes in the lower lung predominance appear similar to the prior study. Patchy ground-glass opacities in the inferior right lower lobe and airspace consolidation in the superior segment of the right lower lobe and adjacent right upper lobe are again seen. Right upper lobe airspace consolidation has mildly increased. There is no pneumothorax or pleural effusion. Musculoskeletal: Healed rib fractures are again noted. No acute fractures are seen. T12 vertebral plasty changes are chronic fresh fracture deformity of T11 is unchanged. Review of the MIP images confirms the above findings. CTA ABDOMEN AND PELVIS FINDINGS VASCULAR Aorta: Infrarenal abdominal aortic aneurysm measuring 3.4 cm appears unchanged. There are atherosclerotic calcifications of the aorta. There is no dissection or inflammation. Celiac: Patent without evidence of aneurysm, dissection, vasculitis or significant stenosis. SMA: Patent without evidence of aneurysm, dissection, vasculitis or significant stenosis. Renals: Both renal arteries are patent without evidence of aneurysm, dissection, vasculitis, fibromuscular dysplasia or significant stenosis. IMA: Patent without evidence  of aneurysm, dissection, vasculitis or significant stenosis. Inflow: Patent without evidence of aneurysm, dissection, vasculitis or significant stenosis. Calcified atherosclerotic disease is present. Veins: No obvious venous abnormality within the limitations of this arterial phase study. Review of the MIP images confirms the above findings. NON-VASCULAR Hepatobiliary: Gallbladder is distended. There is a gallstone in the gallbladder bladder neck measuring 16 mm. There is no pericholecystic inflammation. No focal liver lesions are identified. There is no biliary ductal dilatation. Pancreas: Unremarkable. No pancreatic ductal dilatation or surrounding inflammatory changes. Spleen: The spleen is mildly enlarged. Adrenals/Urinary Tract: Indeterminate right adrenal nodule measuring 2.6 cm is unchanged. Left adrenal gland is within normal limits. Left renal cyst measuring 9 mm is unchanged. There is no hydronephrosis in either kidney. The bladder is within normal limits. Stomach/Bowel: Stomach is within normal limits. Appendix appears normal. No evidence of bowel wall thickening, distention, or inflammatory changes. Lymphatic: No enlarged lymph nodes are seen. Reproductive: Prostate gland is within normal limits. Other: There are small fat containing left inguinal and umbilical hernias. There is no ascites. Musculoskeletal: L1 and L3 vertebroplasty changes are present. Review of the MIP images confirms the above findings. IMPRESSION: 1. No evidence for thoracic aortic dissection or aneurysm. 2. Stable 3.4 cm infrarenal abdominal aortic aneurysm. Recommend follow-up ultrasound every 3 years. 3. Stable emphysema and fibrotic changes. 4. Mild increase in airspace consolidation in the right upper lobe worrisome for worsening pneumonia. 5. Cholelithiasis with distended gallbladder. No pericholecystic inflammation. 6. Stable indeterminate right adrenal nodule. 7. Mild splenomegaly. Aortic Atherosclerosis (ICD10-I70.0) and  Emphysema (ICD10-J43.9). Electronically Signed  By: Tyron Gallon M.D.   On: 06/06/2023 16:12   DG Chest Port 1 View Result Date: 06/06/2023 CLINICAL DATA:  63 year old male with possible sepsis. Metastatic lung cancer. Fall yesterday. EXAM: PORTABLE CHEST 1 VIEW COMPARISON:  Restaging CT Chest, Abdomen, and Pelvis today are reported separately. 04/29/2023. FINDINGS: Portable AP semi upright view at 1024 hours. Chronically low lung volumes, slightly lower when compared to 04/29/2023 radiographs today. Stable mediastinal contours with right hilar architectural distortion. Combined right lung fibrosis and emphysema demonstrated by CT last month. No superimposed pneumothorax, pulmonary edema, pleural effusion, acute lung opacity. Visualized tracheal air column is within normal limits. Stable visualized osseous structures. Paucity of bowel gas. IMPRESSION: No acute cardiopulmonary abnormality; low lung volumes with underlying emphysema and post treatment right lung fibrosis. Electronically Signed   By: Marlise Simpers M.D.   On: 06/06/2023 12:41   MR Lumbar Spine W Wo Contrast Result Date: 06/06/2023 CLINICAL DATA:  63 year old male with stage IV lung cancer. Fall yesterday. Low back pain. EXAM: MRI LUMBAR SPINE WITHOUT AND WITH CONTRAST TECHNIQUE: Multiplanar and multiecho pulse sequences of the lumbar spine were obtained without and with intravenous contrast. CONTRAST:  10mL GADAVIST  GADOBUTROL  1 MMOL/ML IV SOLN COMPARISON:  Lumbar MRI 08/20/2021.  Lumbar spine CT 10/02/2021. FINDINGS: Segmentation:  Normal on the comparison CT. Alignment: Lumbar lordosis not significantly changed from the 2023 CT. Vertebrae: Chronic, and several previously augmented, compression fractures T11 through L5. The T11 compression is new since 10/02/2021, but chronic with absent marrow edema. The other levels appear stable since 10/02/2021. Background bone marrow signal is within normal limits. Visible sacrum and SI joints are intact. No  convincing marrow edema or acute osseous abnormality. Conus medullaris and cauda equina: Conus extends to the T11 level. Conu No lower spinal cord or conus signal abnormality. No abnormal intradural enhancement. No dural thickening. Paraspinal and other soft tissues: Infrarenal abdominal aortic aneurysm, approximately 36 mm diameter now on series 9, image 12. This compares to 34 mm on 10/02/2021. Negative visualized posterior paraspinal soft tissues. Disc levels: Spinal stenosis throughout the lumbar spine related to disc bulging and mild retropulsion. This is mild to moderate at most levels. But moderate to severe multifactorial at the L4-L5 level. IMPRESSION: 1. Diffuse lower thoracic and lumbar compression fractures are chronic and appear likely to be benign. No acute or metastatic process identified in the lumbar spine. 2. Diffuse multifactorial lumbar spinal stenosis, moderate to severe at L4-L5. 3. Abdominal aortic aneurysm measuring 3.6 cm. Recommend surveillance ultrasound in 3 years. Reference: Journal of Vascular Surgery 67.1 (2018): 2-77. J Am Coll Radiol [id number]. Electronically Signed   By: Marlise Simpers M.D.   On: 06/06/2023 12:39    Microbiology: Results for orders placed or performed during the hospital encounter of 06/06/23  Blood Culture (routine x 2)     Status: None (Preliminary result)   Collection Time: 06/06/23 11:58 AM   Specimen: BLOOD  Result Value Ref Range Status   Specimen Description   Final    BLOOD RIGHT ANTECUBITAL Performed at [facility], [address] [place], [place] [address]    Special Requests   Final    BOTTLES DRAWN AEROBIC AND ANAEROBIC Blood Culture adequate volume Performed at [facility], [address] [place], [place] [address]    Culture   Final    NO GROWTH 3 DAYS Performed at [facility], [address] [place], [place] [address]    Report Status PENDING  Incomplete  Blood Culture (routine x  2)     Status: None (Preliminary result)   Collection Time: 06/06/23 12:12 PM   Specimen: BLOOD  Result Value Ref Range Status   Specimen Description   Final    BLOOD BLOOD LEFT HAND Performed at [facility], [address] [place], [place] [address]    Special Requests   Final    BOTTLES DRAWN AEROBIC AND ANAEROBIC Blood Culture adequate volume Performed at [facility], [address], [place], [place] [address]    Culture   Final    NO GROWTH 3 DAYS Performed at [facility], [address], [place], [place] [address]    Report Status PENDING  Incomplete    Labs: CBC: Recent Labs  Lab 06/06/23 1002 06/06/23 1101 06/07/23 0332 06/08/23 0339 06/08/23 0733 06/09/23 0358  WBC 2.6*  --  2.7* 2.9* 3.4* 7.8  NEUTROABS  --   --   --   --  2.3 6.4  HGB 11.9* 10.5* 9.3* 9.3* 10.0* 9.4*  HCT 35.6* 31.0* 28.5* 29.4* 31.1* 30.3*  MCV 86.6  --  89.9 92.5 89.9 93.5  PLT 157  --  112* 105* 128* 140*   Basic Metabolic Panel: Recent Labs  Lab 06/06/23 1002 06/06/23 1101 06/07/23 0332 06/08/23 0339 06/09/23 0358  NA 127* 130* 129* 137 135  K 3.4* 3.5 3.6 3.6 4.3  CL 95* 96* 99 107 103  CO2 23  --  26 22 23   GLUCOSE 156* 138* 88 99 145*  BUN 14 12 10  7* 8  CREATININE 1.12 1.30* 0.98 0.89 0.89  CALCIUM  8.2*  --  7.3* 7.4* 7.5*  MG 1.8  --   --  2.4  --    Liver Function Tests: Recent Labs  Lab 06/06/23 1002 06/07/23 0332 06/08/23 0339 06/09/23 0358  AST 61* 33 30 56*  ALT 87* 53* 44 52*  ALKPHOS 142* 95 93 100  BILITOT 4.7* 2.1* 1.3* 0.8  PROT 6.4* 5.1* 5.2* 5.7*  ALBUMIN  3.0* 2.5* 2.5* 2.7*   CBG: Recent Labs  Lab 06/06/23 1046  GLUCAP 132*    Discharge time spent: greater than 30 minutes.  Signed: Jerline Moon, MD [facility] 06/09/2023

## 2023-06-09 NOTE — Plan of Care (Signed)
   Problem: Elimination: Goal: Will not experience complications related to urinary retention Outcome: Progressing   Problem: Pain Managment: Goal: General experience of comfort will improve and/or be controlled Outcome: Progressing   Problem: Safety: Goal: Ability to remain free from injury will improve Outcome: Progressing

## 2023-06-09 NOTE — Evaluation (Signed)
 Physical Therapy Evaluation Patient Details Name: John [name] MRN: [id number] DOB: 1960/07/22 Today's Date: 06/09/2023  History of Present Illness  63 y.o. male admitted with back pain. CT scan suggesting cholelithiasis with distended gallbladder. s/p laproscopic cholecystectomy. Compression fractures found in T10-L5 with cement augmentation on prior CT scan. 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  Clinical Impression  Patient evaluated by Physical Therapy with no further acute PT needs identified. All education has been completed and the patient has no further questions.   Pt typically IND, does not use AD for amb. Pt  amb 360' with RW, mod I at this time. SpO2=94% on 3L, pt reports this is his average baseline at home.  Encouraged pt to continue to mobilize during acute stay, will benefit from mobility team following.  See below for any follow-up Physical Therapy or equipment needs. PT is signing off. Thank you for this referral.         If plan is discharge home, recommend the following: Help with stairs or ramp for entrance   Can travel by private vehicle        Equipment Recommendations None recommended by PT  Recommendations for Other Services       Functional Status Assessment       Precautions / Restrictions Precautions Precautions: None Precaution/Restrictions Comments: s/p lap chole Restrictions Weight Bearing Restrictions Per Provider Order: No      Mobility  Bed Mobility Overal bed mobility: Modified Independent                  Transfers   Equipment used: None, Rolling walker (2 wheels) Transfers: Sit to/from Stand Sit to Stand: Supervision, Modified independent (Device/Increase time)           General transfer comment: cues for hand placement     Ambulation/Gait Ambulation/Gait assistance: Modified independent (Device/Increase time), Supervision Gait Distance (Feet): 360 Feet Assistive device: Rolling walker (2 wheels) Gait Pattern/deviations: Step-through pattern, WFL(Within Functional Limits)       General Gait Details: cues for breathing and self monitoring rate of exertion, rest breaks if needed. pt reports only mild dyspnea with amb; SpO2=94% on 3L  Stairs            Wheelchair Mobility     Tilt Bed    Modified Rankin (Stroke Patients Only)       Balance Overall balance assessment: Mild deficits observed, not formally tested                                           Pertinent Vitals/Pain Pain Assessment Pain Assessment: Faces Faces Pain Scale: Hurts a little bit Pain Location: abdomen Pain Descriptors / Indicators: Sore, Discomfort Pain Intervention(s): Limited activity within patient's tolerance, Monitored during session, Premedicated before session    Home Living Family/patient expects to be discharged to:: Private residence Living Arrangements: Spouse/significant other Available Help at Discharge: Family;Available PRN/intermittently Type of Home: House Home Access: Stairs to enter   Entrance Stairs-Number of Steps: 4   Home Layout: One level Home Equipment: Agricultural consultant (2 wheels);BSC/3in1;Shower seat;Hand held shower head;Wheelchair - manual Additional Comments: works from home and wife able to work from home    Prior Function Prior Level of Function : Independent/Modified Independent  Mobility Comments: typically does not use device; O2 dependent at 3L       Extremity/Trunk Assessment   Upper Extremity Assessment Upper Extremity Assessment: Defer to OT evaluation;Overall WFL for tasks assessed    Lower Extremity Assessment Lower Extremity Assessment: Overall WFL for tasks assessed       Communication   Communication Communication: No  apparent difficulties    Cognition Arousal: Alert Behavior During Therapy: WFL for tasks assessed/performed   PT - Cognitive impairments: No apparent impairments                         Following commands: Intact       Cueing Cueing Techniques: Verbal cues     General Comments      Exercises     Assessment/Plan    PT Assessment Patient does not need any further PT services  PT Problem List         PT Treatment Interventions      PT Goals (Current goals can be found in the Care Plan section)  Acute Rehab PT Goals PT Goal Formulation: All assessment and education complete, DC therapy    Frequency       Co-evaluation               AM-PAC PT "6 Clicks" Mobility  Outcome Measure Help needed turning from your back to your side while in a flat bed without using bedrails?: None Help needed moving from lying on your back to sitting on the side of a flat bed without using bedrails?: None Help needed moving to and from a bed to a chair (including a wheelchair)?: None Help needed standing up from a chair using your arms (e.g., wheelchair or bedside chair)?: None Help needed to walk in hospital room?: None Help needed climbing 3-5 steps with a railing? : None 6 Click Score: 24    End of Session Equipment Utilized During Treatment: Gait belt Activity Tolerance: Patient tolerated treatment well Patient left: in bed;with call bell/phone within reach   PT Visit Diagnosis: Other abnormalities of gait and mobility (R26.89)    Time: 1610-9604 PT Time Calculation (min) (ACUTE ONLY): 12 min   Charges:   PT Evaluation $PT Eval Low Complexity: 1 Low   PT General Charges $$ ACUTE PT VISIT: 1 Visit         Zamyia Gowell, PT  Acute Rehab Dept ([facility]) [phone]  06/09/2023   [facility] 06/09/2023, 1:27 PM

## 2023-06-09 NOTE — Evaluation (Signed)
 Occupational Therapy Evaluation Patient Details Name: John [name] MRN: [id number] DOB: 12-10-1960 Today's Date: 06/09/2023   History of Present Illness   63 y.o. male admitted with back pain. CT scan suggesting cholelithiasis with distended gallbladder. s/p laproscopic cholecystectomy. Compression fractures found in T10-L5 with cement augmentation on prior CT scan. 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     Clinical Impressions PTA, patient lives at home with wife and was independent with A/IADL's, working from home, driving with PRN O2 use intermittently. Patient reports resolved back pain with only minor discomfort s/p surgery yesterday. Patient on 3 ltrs O2 via [place] and removed for ambulation to bathroom and light EOB seated and standing ADL's. Remained 93% on RA, no SOB but replaced once session ended. Patient with decreased activity tolerance and higher level balance deficits as outlined below but relatively mod I to distant Supervision level within room. Patient has all necessary DME in home and has a reacher for safe LB and floor level reach. No further skilled OT needs identified as patient reports wife will be home for support as needed upon hospital discharge. OT signing off at this time.      If plan is discharge home, recommend the following:   A little help with walking and/or transfers;A little help with bathing/dressing/bathroom;Assistance with cooking/housework;Assist for transportation;Help with stairs or ramp for entrance     Functional Status Assessment   Patient has had a recent decline in their functional status and demonstrates the ability to make significant improvements in function in a reasonable and predictable amount of time.     Equipment Recommendations   Other (comment) (patient has all needed  DME including a reacher)      Precautions/Restrictions   Precautions Precautions: None Precaution/Restrictions Comments: s/p abdominal lap surgery Restrictions Weight Bearing Restrictions Per Provider Order: No     Mobility Bed Mobility Overal bed mobility: Modified Independent             General bed mobility comments: no issues rolling and moving to sit with + body mechanics post-lap abdominalsurgery    Transfers Overall transfer level: Needs assistance Equipment used: None Transfers: Sit to/from Stand, Bed to chair/wheelchair/BSC Sit to Stand: Supervision     Step pivot transfers: Supervision     General transfer comment: distant supervision after up with O2 removed 93% on RA      Balance Overall balance assessment: Mild deficits observed, not formally tested (no LOB during session but dynamic challenges require guarding)                                         ADL either performed or assessed with clinical judgement   ADL Overall ADL's : Needs assistance/impaired Eating/Feeding: Independent   Grooming: Wash/dry hands;Wash/dry face;Independent   Upper Body Bathing: Independent;Sitting   Lower Body Bathing: Set up;Sit to/from stand;Sitting/lateral leans   Upper Body Dressing : Independent;Sitting   Lower Body Dressing: Sitting/lateral leans;Sit to/from stand;Set up   Toilet Transfer: Supervision/safety;Ambulation Toilet Transfer Details (indicate cue type and reason): RA with 93% SpO2 Toileting- Clothing Manipulation and Hygiene: Independent       Functional mobility during ADLs: Supervision/safety General ADL Comments: distant S for toileting in bathroom, EOB dressing with figure 4 techniques, has reacher at home and instructed in integration for  LB and floor item reach     Vision Baseline Vision/History: 0 No visual deficits;1 Wears glasses Vision Assessment?: No apparent visual deficits     Perception Perception: Within  Functional Limits       Praxis Praxis: WFL       Pertinent Vitals/Pain Pain Assessment Pain Assessment: Faces Faces Pain Scale: Hurts little more Pain Location: abdomen Pain Descriptors / Indicators: Sore Pain Intervention(s): Premedicated before session, Repositioned, Relaxation     Extremity/Trunk Assessment Upper Extremity Assessment Upper Extremity Assessment: Right hand dominant;Overall [facility] for tasks assessed   Lower Extremity Assessment Lower Extremity Assessment: Overall WFL for tasks assessed   Cervical / Trunk Assessment Cervical / Trunk Assessment: Normal   Communication Communication Communication: No apparent difficulties   Cognition Arousal: Alert Behavior During Therapy: WFL for tasks assessed/performed Cognition: No apparent impairments                               Following commands: Intact       Cueing  General Comments   Cueing Techniques: Verbal cues  abdomen incision intact           Home Living Family/patient expects to be discharged to:: Private residence Living Arrangements: Spouse/significant other Available Help at Discharge: Family Type of Home: House Home Access: Stairs to enter Secretary/administrator of Steps: 4 Entrance Stairs-Rails: Left Home Layout: One level     Bathroom Shower/Tub: IT trainer: Standard Bathroom Accessibility: Yes How Accessible: Accessible via walker Home Equipment: Rolling Walker (2 wheels);BSC/3in1;Shower seat;Hand held shower head;Wheelchair - manual   Additional Comments: works from home and wife able to work from home      Prior Functioning/Environment Prior Level of Function : Independent/Modified Independent             Mobility Comments:  (independent without AD) ADLs Comments: uses O2 at home prn 3 lts, independent pta    AM-PAC OT "6 Clicks" Daily Activity     Outcome Measure Help from another person eating meals?: None Help from  another person taking care of personal grooming?: None Help from another person toileting, which includes using toliet, bedpan, or urinal?: A Little Help from another person bathing (including washing, rinsing, drying)?: A Little Help from another person to put on and taking off regular upper body clothing?: A Little Help from another person to put on and taking off regular lower body clothing?: A Little 6 Click Score: 20   End of Session Equipment Utilized During Treatment: Gait belt;Oxygen Nurse Communication: Mobility status (Flowsheet data entered for voiding)  Activity Tolerance: Patient tolerated treatment well Patient left: in bed;with call bell/phone within reach (recliner causes back pain therefore patient declined and will sit EOB intermittently)                   Time: 1610-9604 OT Time Calculation (min): 33 min Charges:  OT General Charges $OT Visit: 1 Visit OT Evaluation $OT Eval Moderate Complexity: 1 Mod OT Treatments $Self Care/Home Management : 8-22 mins Emi Lymon OT/L Acute Rehabilitation Department  [phone]  06/09/2023, 9:55 AM

## 2023-06-09 NOTE — Progress Notes (Signed)
 Progress Note  1 Day Post-Op  Subjective: Mr. Debold is lying in bed this AM with minimal abdominal pain. He has passed a lot of gas but has yet to have BM. He has been eating and drinking, as well as moving around the room today, with no problems. Denies nausea or vomiting.  Objective: Vital signs in last 24 hours: Temp:  [97.4 F (36.3 C)-98 F (36.7 C)] 97.8 F (36.6 C) (04/24 0523) Pulse Rate:  [66-96] 66 (04/24 0523) Resp:  [13-22] 17 (04/24 0523) BP: (105-149)/(54-89) 111/64 (04/24 0523) SpO2:  [88 %-99 %] 96 % (04/24 0832) Weight:  [113.4 kg] 113.4 kg (04/23 1202) Last BM Date : 06/07/23  Intake/Output from previous day: 04/23 0701 - 04/24 0700 In: 1117.7 [P.O.:480; I.V.:37.7; IV Piggyback:600] Out: 1145 [Urine:1125; Blood:20] Intake/Output this shift: Total I/O In: -  Out: 400 [Urine:400]  PE: General: pleasant, WD, obese male who is laying in bed in NAD Heart: regular, rate, and rhythm.   Lungs:  Respiratory effort nonlabored Abd: soft, minimal TTP in umbilical area, non distended. Incision sites are negative for erythema, edema, or discharge. MS: all 4 extremities are symmetrical with no cyanosis, clubbing, or edema. Skin: warm and dry with no masses, lesions, or rashes Psych: A&Ox3 with an appropriate affect.   Lab Results:  Recent Labs    06/08/23 0733 06/09/23 0358  WBC 3.4* 7.8  HGB 10.0* 9.4*  HCT 31.1* 30.3*  PLT 128* 140*   BMET Recent Labs    06/08/23 0339 06/09/23 0358  NA 137 135  K 3.6 4.3  CL 107 103  CO2 22 23  GLUCOSE 99 145*  BUN 7* 8  CREATININE 0.89 0.89  CALCIUM  7.4* 7.5*   PT/INR No results for input(s): "LABPROT", "INR" in the last 72 hours.  CMP     Component Value Date/Time   NA 135 06/09/2023 0358   NA 139 04/12/2023 0937   K 4.3 06/09/2023 0358   CL 103 06/09/2023 0358   CO2 23 06/09/2023 0358   GLUCOSE 145 (H) 06/09/2023 0358   BUN 8 06/09/2023 0358   BUN 12 04/12/2023 0937   CREATININE 0.89 06/09/2023  0358   CREATININE 1.15 06/02/2023 0937   CALCIUM  7.5 (L) 06/09/2023 0358   PROT 5.7 (L) 06/09/2023 0358   PROT 6.5 04/12/2023 0937   ALBUMIN  2.7 (L) 06/09/2023 0358   ALBUMIN  4.0 04/12/2023 0937   AST 56 (H) 06/09/2023 0358   AST 181 (HH) 06/02/2023 0937   ALT 52 (H) 06/09/2023 0358   ALT 127 (H) 06/02/2023 0937   ALKPHOS 100 06/09/2023 0358   BILITOT 0.8 06/09/2023 0358   BILITOT 1.2 06/02/2023 0937   GFRNONAA >60 06/09/2023 0358   GFRNONAA >60 06/02/2023 0937   Lipase     Component Value Date/Time   LIPASE 25 06/07/2023 0332       Studies/Results: MR ABDOMEN MRCP W WO CONTAST Result Date: 06/08/2023 CLINICAL DATA:  Cholecystitis, biliary obstruction. EXAM: MRI ABDOMEN WITHOUT AND WITH CONTRAST (INCLUDING MRCP) TECHNIQUE: Multiplanar multisequence MR imaging of the abdomen was performed both before and after the administration of intravenous contrast. Heavily T2-weighted images of the biliary and pancreatic ducts were obtained, and three-dimensional MRCP images were rendered by post processing. CONTRAST:  10mL GADAVIST  GADOBUTROL  1 MMOL/ML IV SOLN COMPARISON:  Abdominal ultrasound 06/06/2023 FINDINGS: Lower chest: Consolidation in the right lung especially in the right lower lobe with interstitial accentuation the left lung base. Hepatobiliary: 3.1 cm gallstone in the neck of  the gallbladder. No biliary dilatation or findings of choledocholithiasis, to the extent that this can be assessed based on motion artifact. No pericholecystic fluid although there is some trace fluid along the inferior margin of the right hepatic lobe. Pancreas: Fatty atrophy of the pancreas. Spleen: The spleen measures 14.2 by 13.9 by 8.0 cm (volume = 830 cm^3), compatible with splenomegaly. Small bandlike hypodense areas of the peripheral spleen for example on image 57 series 18, similar to prior exams and likely incidental. Adrenals/Urinary Tract: Right adrenal lesion 3.3 by 2.3 cm, much of this appears to  represent a complex cystic component which does not appreciably enhance. However, a component of this process demonstrated accentuated metabolic activity on 02/11/2023 compatible with metastatic lesion in this patient with known lung cancer. Currently the enhancing portion of this lesion is minimal in size. Left adrenal gland unremarkable.  No significant renal abnormality. Stomach/Bowel: Unremarkable Vascular/Lymphatic: 3.3 cm infrarenal abdominal aortic aneurysm as shown previously. Other: Trace ascites along the inferior margin of the right hepatic lobe. Musculoskeletal: Prior compression fractures at all levels between T12 and L3 with vertebral augmentations at T12, L1, and L3. IMPRESSION: 1. 3.1 cm gallstone in the neck of the gallbladder. No biliary dilatation or findings of choledocholithiasis, to the extent that this can be assessed based on motion artifact. 2. Trace ascites along the inferior margin of the right hepatic lobe. 3. Right adrenal lesion 3.3 by 2.3 cm, much of this appears to represent a complex cystic component which does not appreciably enhance. However, a component of this process demonstrated accentuated metabolic activity on 02/11/2023 compatible with metastatic lesion in this patient with known lung cancer. 4. Consolidation in the right lung especially in the right lower lobe with interstitial accentuation the left lung base. 5. 3.3 cm infrarenal abdominal aortic aneurysm. Recommend follow-up ultrasound every 3 years. This recommendation follows ACR consensus guidelines: White Paper of the ACR Incidental Findings Committee II on Vascular Findings. J Am Coll Radiol 2013; 10:789-794. 6. Splenomegaly. 7. Prior compression fractures at all levels between T12 and L3 with vertebral augmentations at T12, L1, and L3. Electronically Signed   By: Freida Jes M.D.   On: 06/08/2023 11:00   MR 3D Recon At Scanner Result Date: 06/08/2023 CLINICAL DATA:  Cholecystitis, biliary obstruction.  EXAM: MRI ABDOMEN WITHOUT AND WITH CONTRAST (INCLUDING MRCP) TECHNIQUE: Multiplanar multisequence MR imaging of the abdomen was performed both before and after the administration of intravenous contrast. Heavily T2-weighted images of the biliary and pancreatic ducts were obtained, and three-dimensional MRCP images were rendered by post processing. CONTRAST:  10mL GADAVIST  GADOBUTROL  1 MMOL/ML IV SOLN COMPARISON:  Abdominal ultrasound 06/06/2023 FINDINGS: Lower chest: Consolidation in the right lung especially in the right lower lobe with interstitial accentuation the left lung base. Hepatobiliary: 3.1 cm gallstone in the neck of the gallbladder. No biliary dilatation or findings of choledocholithiasis, to the extent that this can be assessed based on motion artifact. No pericholecystic fluid although there is some trace fluid along the inferior margin of the right hepatic lobe. Pancreas: Fatty atrophy of the pancreas. Spleen: The spleen measures 14.2 by 13.9 by 8.0 cm (volume = 830 cm^3), compatible with splenomegaly. Small bandlike hypodense areas of the peripheral spleen for example on image 57 series 18, similar to prior exams and likely incidental. Adrenals/Urinary Tract: Right adrenal lesion 3.3 by 2.3 cm, much of this appears to represent a complex cystic component which does not appreciably enhance. However, a component of this process demonstrated accentuated metabolic activity  on 02/11/2023 compatible with metastatic lesion in this patient with known lung cancer. Currently the enhancing portion of this lesion is minimal in size. Left adrenal gland unremarkable.  No significant renal abnormality. Stomach/Bowel: Unremarkable Vascular/Lymphatic: 3.3 cm infrarenal abdominal aortic aneurysm as shown previously. Other: Trace ascites along the inferior margin of the right hepatic lobe. Musculoskeletal: Prior compression fractures at all levels between T12 and L3 with vertebral augmentations at T12, L1, and L3.  IMPRESSION: 1. 3.1 cm gallstone in the neck of the gallbladder. No biliary dilatation or findings of choledocholithiasis, to the extent that this can be assessed based on motion artifact. 2. Trace ascites along the inferior margin of the right hepatic lobe. 3. Right adrenal lesion 3.3 by 2.3 cm, much of this appears to represent a complex cystic component which does not appreciably enhance. However, a component of this process demonstrated accentuated metabolic activity on 02/11/2023 compatible with metastatic lesion in this patient with known lung cancer. 4. Consolidation in the right lung especially in the right lower lobe with interstitial accentuation the left lung base. 5. 3.3 cm infrarenal abdominal aortic aneurysm. Recommend follow-up ultrasound every 3 years. This recommendation follows ACR consensus guidelines: White Paper of the ACR Incidental Findings Committee II on Vascular Findings. J Am Coll Radiol 2013; 10:789-794. 6. Splenomegaly. 7. Prior compression fractures at all levels between T12 and L3 with vertebral augmentations at T12, L1, and L3. Electronically Signed   By: Freida Jes M.D.   On: 06/08/2023 11:00   NM Hepatobiliary Liver Func Result Date: 06/07/2023 CLINICAL DATA:  Cholecystitis suspected. EXAM: NUCLEAR MEDICINE HEPATOBILIARY IMAGING TECHNIQUE: Sequential images of the abdomen were obtained out to 60 minutes following intravenous administration of radiopharmaceutical. RADIOPHARMACEUTICALS:  7.83 mCi Tc-75m  Choletec  IV COMPARISON:  None Available. FINDINGS: Prompt uptake and biliary excretion of activity by the liver is seen. No gallbladder activity is visualized including after administration of 3 mg of morphine  after 60 minutes of imaging. Biliary activity passes into small bowel, consistent with patent common bile duct. IMPRESSION: No uptake in the gallbladder compatible with acute cholecystitis. Electronically Signed   By: Rozell Cornet M.D.   On: 06/07/2023 20:12     Anti-infectives: Anti-infectives (From admission, onward)    Start     Dose/Rate Route Frequency Ordered Stop   06/07/23 1600  azithromycin  (ZITHROMAX ) 500 mg in sodium chloride  0.9 % 250 mL IVPB  Status:  Discontinued        500 mg 250 mL/hr over 60 Minutes Intravenous Every 24 hours 06/07/23 1339 06/09/23 0854   06/07/23 1200  cefTRIAXone  (ROCEPHIN ) 2 g in sodium chloride  0.9 % 100 mL IVPB  Status:  Discontinued        2 g 200 mL/hr over 30 Minutes Intravenous Every 24 hours 06/07/23 1112 06/08/23 1528   06/06/23 1700  metroNIDAZOLE  (FLAGYL ) IVPB 500 mg        500 mg 100 mL/hr over 60 Minutes Intravenous  Once 06/06/23 1648 06/06/23 1934   06/06/23 1630  cefTRIAXone  (ROCEPHIN ) 1 g in sodium chloride  0.9 % 100 mL IVPB        1 g 200 mL/hr over 30 Minutes Intravenous  Once 06/06/23 1618 06/06/23 1716   06/06/23 1630  azithromycin  (ZITHROMAX ) 500 mg in sodium chloride  0.9 % 250 mL IVPB        500 mg 250 mL/hr over 60 Minutes Intravenous  Once 06/06/23 1618 06/06/23 1826        Assessment/Plan  Cholelithiasis and probable acute cholecystitis -  -  RUQ US  with stone in the neck of the gallbladder and mild gallbladder wall thickening, hepatic steatosis  - Tbili 4.7 on admit, down to 0.9 today; Alk phos was 142 and now normalized, AST/ALT 61/87 and now 56/52 - MRCP  - last dose Gemzar /Navelbine  4/10, finished XRT for right adrenal 04/04/23 - HIDA consistent with acute cholecystitis  - MRCP indicates a 3.1 cm gallstone in the neck of the gallbladder. No biliary dilatation or findings of choledocholithiasis,  Underwent Laparoscopic Cholecystectomy on 06/08/23 He is doing well this morning, no concerns currently. Tolerating meals and ambulating around. Incisions appears to be healing well. 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 Fib on Eliquis  Anxiety and depression   LOS: 3 days   I reviewed Consultant GI notes, hospitalist notes, last 24 h vitals and pain scores, last 48 h intake and output, last 24 h labs and trends, and last 24 h imaging results.    Webster Hal, [facility] 06/09/2023, 11:56 AM

## 2023-06-09 NOTE — Progress Notes (Signed)
 I am very impressed by the fact that [name] did have his gallbladder out yesterday.  Dr. Marny Sires did a fantastic job.  [name] looks pretty good this morning.  He ate last night.  He really looks good.  He is a little bit sore.  However, looks like everything went fairly well with the operative procedure.  Hopefully, he will be able to go home in a day or so.  He feels a whole lot better.  His back is feeling a lot better.  His labs show white cell count of 7.8.  Hemoglobin 9.4.  Platelet count 140,000.  His sodium 135.  Potassium 4.3.  BUN 8 creatinine 0.89.  Calcium  7.5 with albumin  of 2.7.  He should be able to get up today and walk around.  He has had no cough.  No shortness of breath.  He has had no rashes.  Has had no leg swelling.  His vital signs are stable.  Temperature 97.8.  Pulse 66.  Blood pressure 111/64.  His abdomen is slightly distended.  He has a healing laparoscopy scars.  He does have decent bowel sounds.  He is a little bit tender to palpation.  Lungs sound clear bilaterally.  He has good air movement bilaterally.  Cardiac exam regular rate and rhythm.  Extremities shows maybe a little bit of edema in the legs.  Skin exam shows no rashes.   I am just incredibly grateful for the surgical skill of Dr. Marny Sires.  I am really not surprised that the outcome has been very good so far.  Again, hopefully Mr. Schwalbe will be able to go home soon.  We will hold off on his chemotherapy for an extra couple weeks decided he can recover.  Rayleen Cal, MD  [place] 12:12

## 2023-06-10 ENCOUNTER — Inpatient Hospital Stay

## 2023-06-10 LAB — SURGICAL PATHOLOGY

## 2023-06-10 LAB — HIV-1/2 AB - DIFFERENTIATION
HIV 1 Ab: NONREACTIVE
HIV 2 Ab: UNDETERMINED

## 2023-06-10 LAB — HIV-1/HIV-2 QUALITATIVE RNA
HIV-1 RNA, Qualitative: NONREACTIVE
HIV-2 RNA, Qualitative: NONREACTIVE

## 2023-06-11 LAB — CULTURE, BLOOD (ROUTINE X 2)
Culture: NO GROWTH
Culture: NO GROWTH
Special Requests: ADEQUATE
Special Requests: ADEQUATE

## 2023-06-13 ENCOUNTER — Other Ambulatory Visit (HOSPITAL_COMMUNITY)

## 2023-06-13 ENCOUNTER — Encounter (HOSPITAL_COMMUNITY)

## 2023-06-14 ENCOUNTER — Other Ambulatory Visit (HOSPITAL_COMMUNITY)

## 2023-06-14 ENCOUNTER — Encounter (HOSPITAL_COMMUNITY)

## 2023-06-16 ENCOUNTER — Inpatient Hospital Stay

## 2023-06-16 ENCOUNTER — Inpatient Hospital Stay: Admitting: Hematology & Oncology

## 2023-06-16 DIAGNOSIS — C779 Secondary and unspecified malignant neoplasm of lymph node, unspecified: Secondary | ICD-10-CM | POA: Insufficient documentation

## 2023-06-16 DIAGNOSIS — Z5111 Encounter for antineoplastic chemotherapy: Secondary | ICD-10-CM | POA: Insufficient documentation

## 2023-06-16 DIAGNOSIS — C3411 Malignant neoplasm of upper lobe, right bronchus or lung: Secondary | ICD-10-CM | POA: Insufficient documentation

## 2023-06-16 DIAGNOSIS — C7970 Secondary malignant neoplasm of unspecified adrenal gland: Secondary | ICD-10-CM | POA: Insufficient documentation

## 2023-06-16 NOTE — Progress Notes (Signed)
 [facility] Clinical Social Work  Clinical Social Work was referred by nurse for assessment of psychosocial needs.  Clinical Social Worker contacted patient by phone to offer support and assess for needs.    CSW to mail patient massage certificates.  Also discussed lung cancer support groups which he is interested in.  Will also mail him [facility] calendar.  Patient to discuss possible meeting with patient and his wife regarding support.     Kennth Peal, LCSW  Clinical Social Worker [facility]

## 2023-06-19 NOTE — Progress Notes (Unsigned)
  Electrophysiology Office Follow up Visit Note:    Date:  06/20/2023   ID:  John [name], DOB [birth year], MRN [id number]  PCP:  Dorthy Gavia, MD  [facility] Cardiologist:  Gaylyn Keas, MD  [facility] Electrophysiologist:  Boyce Byes, MD    Interval History:     John [name] is a 63 y.o. male who presents for a follow up visit.   I last saw the patient March 30, 2023 for atrial fibrillation.  The patient has metastatic squamous cell carcinoma of the right lung with adrenal mets.  He was on chemotherapy at the last appointment.  He takes Eliquis  for stroke prophylaxis.  At the last appointment we planned for admission for amiodarone  loading followed by cardioversion.  He was admitted to the hospital last month with abdominal pain and was diagnosed with cholecystitis.  He had a cholecystectomy on April 23.  He is doing well today.  He feels much better after his gallbladder was removed.  A lot of his pain has improved.        Past medical, surgical, social and family history were reviewed.  ROS:   Please see the history of present illness.    All other systems reviewed and are negative.  EKGs/Labs/Other Studies Reviewed:    The following studies were reviewed today:  April 01, 2023 echo EF 60% RV normal   April 29, 2023 EKG shows sinus rhythm    EKG Interpretation Date/Time:  Monday Jun 20 2023 10:25:08 EDT Ventricular Rate:  106 PR Interval:  170 QRS Duration:  96 QT Interval:  382 QTC Calculation: 507 R Axis:   -83  Text Interpretation: Sinus tachycardia Left anterior fascicular block Confirmed by Harvie Liner [phone]) on 06/20/2023 11:02:07 AM    Physical Exam:    VS:  BP 102/62   Pulse (!) 106   Ht 5\' 11"  (1.803 m)   Wt 237 lb (107.5 kg)   SpO2 97%   BMI 33.05 kg/m     Wt Readings from Last 3 Encounters:  06/20/23 237 lb (107.5 kg)  06/08/23 250 lb (113.4 kg)  05/09/23 249 lb (112.9 kg)     GEN: no distress CARD: RRR,  No MRG RESP: No IWOB. CTAB.      ASSESSMENT:    1. Persistent atrial fibrillation (HCC)   2. Encounter for long-term (current) use of high-risk medication    PLAN:    In order of problems listed above:  #Persistent atrial fibrillation #High risk med monitoring-amiodarone  Maintaining sinus rhythm on amiodarone .  Continue. Continue Eliquis  for stroke prophylaxis  #Metastatic squamous cell carcinoma of the lung  Follow-up CMP, TSH and free T4 in 6 to 8 weeks.   Follow-up with APP in 6 months.  Signed, Harvie Liner, MD, [facility], [facility] 06/20/2023 11:02 AM    Electrophysiology  [facility]

## 2023-06-20 ENCOUNTER — Ambulatory Visit: Attending: Cardiology | Admitting: Cardiology

## 2023-06-20 ENCOUNTER — Other Ambulatory Visit: Payer: Self-pay | Admitting: Emergency Medicine

## 2023-06-20 ENCOUNTER — Other Ambulatory Visit: Payer: Self-pay

## 2023-06-20 ENCOUNTER — Encounter: Payer: Self-pay | Admitting: Cardiology

## 2023-06-20 VITALS — BP 102/62 | HR 106 | Ht 71.0 in | Wt 237.0 lb

## 2023-06-20 DIAGNOSIS — I4819 Other persistent atrial fibrillation: Secondary | ICD-10-CM

## 2023-06-20 DIAGNOSIS — Z79899 Other long term (current) drug therapy: Secondary | ICD-10-CM

## 2023-06-20 NOTE — Patient Instructions (Signed)
 Medication Instructions:  Your physician recommends that you continue on your current medications as directed. Please refer to the Current Medication list given to you today.  *If you need a refill on your cardiac medications before your next appointment, please call your pharmacy*  Lab Work: IN 6-8 weeks: CMET, TSH, T4 -you can have these done at any [facility] location.   Follow-Up: At [facility] you and your health needs are our priority.  As part of our continuing mission to provide you with exceptional heart care, our providers are all part of one team.  This team includes your primary Cardiologist (physician) and Advanced Practice Providers or APPs (Physician Assistants and Nurse Practitioners) who all work together to provide you with the care you need, when you need it.  Your next appointment:   6 months  Provider:   You will see one of the following Advanced Practice Providers on your designated Care Team:   Mertha Abrahams, Kennard Pea [address]" [place], PA-C Suzann Riddle, NP Creighton Doffing, NP

## 2023-06-23 ENCOUNTER — Emergency Department (HOSPITAL_COMMUNITY)
Admission: EM | Admit: 2023-06-23 | Discharge: 2023-06-24 | Disposition: A | Discharge status: Home / Self Care | Attending: Emergency Medicine | Admitting: Emergency Medicine

## 2023-06-23 ENCOUNTER — Emergency Department (HOSPITAL_COMMUNITY)

## 2023-06-23 ENCOUNTER — Encounter: Payer: Self-pay | Admitting: Hematology & Oncology

## 2023-06-23 ENCOUNTER — Inpatient Hospital Stay

## 2023-06-23 ENCOUNTER — Inpatient Hospital Stay (HOSPITAL_BASED_OUTPATIENT_CLINIC_OR_DEPARTMENT_OTHER): Admitting: Hematology & Oncology

## 2023-06-23 ENCOUNTER — Other Ambulatory Visit: Payer: Self-pay

## 2023-06-23 ENCOUNTER — Encounter (HOSPITAL_COMMUNITY): Payer: Self-pay | Admitting: Emergency Medicine

## 2023-06-23 VITALS — BP 100/76 | HR 101 | Temp 97.5°F | Resp 19 | Ht 71.0 in | Wt 237.0 lb

## 2023-06-23 VITALS — BP 112/59 | HR 66 | Resp 18

## 2023-06-23 DIAGNOSIS — C3491 Malignant neoplasm of unspecified part of right bronchus or lung: Secondary | ICD-10-CM

## 2023-06-23 DIAGNOSIS — R531 Weakness: Secondary | ICD-10-CM | POA: Insufficient documentation

## 2023-06-23 DIAGNOSIS — C779 Secondary and unspecified malignant neoplasm of lymph node, unspecified: Secondary | ICD-10-CM | POA: Diagnosis not present

## 2023-06-23 DIAGNOSIS — C3400 Malignant neoplasm of unspecified main bronchus: Secondary | ICD-10-CM

## 2023-06-23 DIAGNOSIS — Z7901 Long term (current) use of anticoagulants: Secondary | ICD-10-CM | POA: Insufficient documentation

## 2023-06-23 DIAGNOSIS — M791 Myalgia, unspecified site: Secondary | ICD-10-CM | POA: Insufficient documentation

## 2023-06-23 DIAGNOSIS — C7970 Secondary malignant neoplasm of unspecified adrenal gland: Secondary | ICD-10-CM

## 2023-06-23 DIAGNOSIS — C3411 Malignant neoplasm of upper lobe, right bronchus or lung: Secondary | ICD-10-CM | POA: Diagnosis not present

## 2023-06-23 DIAGNOSIS — R079 Chest pain, unspecified: Secondary | ICD-10-CM | POA: Diagnosis present

## 2023-06-23 DIAGNOSIS — R110 Nausea: Secondary | ICD-10-CM | POA: Insufficient documentation

## 2023-06-23 DIAGNOSIS — J449 Chronic obstructive pulmonary disease, unspecified: Secondary | ICD-10-CM | POA: Diagnosis not present

## 2023-06-23 LAB — CMP (CANCER CENTER ONLY)
ALT: 18 U/L (ref 0–44)
AST: 18 U/L (ref 15–41)
Albumin: 4 g/dL (ref 3.5–5.0)
Alkaline Phosphatase: 111 U/L (ref 38–126)
Anion gap: 9 (ref 5–15)
BUN: 9 mg/dL (ref 8–23)
CO2: 24 mmol/L (ref 22–32)
Calcium: 8.8 mg/dL — ABNORMAL LOW (ref 8.9–10.3)
Chloride: 107 mmol/L (ref 98–111)
Creatinine: 1.1 mg/dL (ref 0.61–1.24)
GFR, Estimated: >60 mL/min (ref >60)
Glucose, Bld: 103 mg/dL — ABNORMAL HIGH (ref 70–99)
Potassium: 4 mmol/L (ref 3.5–5.1)
Sodium: 140 mmol/L (ref 135–145)
Total Bilirubin: 0.7 mg/dL (ref 0.0–1.2)
Total Protein: 6.7 g/dL (ref 6.5–8.1)

## 2023-06-23 LAB — CBC WITH DIFFERENTIAL (CANCER CENTER ONLY)
Abs Immature Granulocytes: 0.02 10*3/uL (ref 0.00–0.07)
Basophils Absolute: 0.1 10*3/uL (ref 0.0–0.1)
Basophils Relative: 1 %
Eosinophils Absolute: 0.1 10*3/uL (ref 0.0–0.5)
Eosinophils Relative: 1 %
HCT: 38.5 % — ABNORMAL LOW (ref 39.0–52.0)
Hemoglobin: 12.7 g/dL — ABNORMAL LOW (ref 13.0–17.0)
Immature Granulocytes: 0 %
Lymphocytes Relative: 13 %
Lymphs Abs: 0.9 10*3/uL (ref 0.7–4.0)
MCH: 30.3 pg (ref 26.0–34.0)
MCHC: 33 g/dL (ref 30.0–36.0)
MCV: 91.9 fL (ref 80.0–100.0)
Monocytes Absolute: 0.8 10*3/uL (ref 0.1–1.0)
Monocytes Relative: 13 %
Neutro Abs: 4.5 10*3/uL (ref 1.7–7.7)
Neutrophils Relative %: 72 %
Platelet Count: 353 10*3/uL (ref 150–400)
RBC: 4.19 MIL/uL — ABNORMAL LOW (ref 4.22–5.81)
RDW: 20.3 % — ABNORMAL HIGH (ref 11.5–15.5)
WBC Count: 6.4 10*3/uL (ref 4.0–10.5)
nRBC: 0 % (ref 0.0–0.2)

## 2023-06-23 LAB — CBC WITH DIFFERENTIAL/PLATELET
Abs Immature Granulocytes: 0.03 10*3/uL (ref 0.00–0.07)
Basophils Absolute: 0 10*3/uL (ref 0.0–0.1)
Basophils Relative: 1 %
Eosinophils Absolute: 0 10*3/uL (ref 0.0–0.5)
Eosinophils Relative: 1 %
HCT: 33 % — ABNORMAL LOW (ref 39.0–52.0)
Hemoglobin: 10.4 g/dL — ABNORMAL LOW (ref 13.0–17.0)
Immature Granulocytes: 1 %
Lymphocytes Relative: 5 %
Lymphs Abs: 0.3 10*3/uL — ABNORMAL LOW (ref 0.7–4.0)
MCH: 30.4 pg (ref 26.0–34.0)
MCHC: 31.5 g/dL (ref 30.0–36.0)
MCV: 96.5 fL (ref 80.0–100.0)
Monocytes Absolute: 0.4 10*3/uL (ref 0.1–1.0)
Monocytes Relative: 8 %
Neutro Abs: 4.7 10*3/uL (ref 1.7–7.7)
Neutrophils Relative %: 84 %
Platelets: 229 10*3/uL (ref 150–400)
RBC: 3.42 MIL/uL — ABNORMAL LOW (ref 4.22–5.81)
RDW: 19.9 % — ABNORMAL HIGH (ref 11.5–15.5)
WBC: 5.5 10*3/uL (ref 4.0–10.5)
nRBC: 0 % (ref 0.0–0.2)

## 2023-06-23 LAB — I-STAT CHEM 8, ED
BUN: 7 mg/dL — ABNORMAL LOW (ref 8–23)
Calcium, Ion: 0.98 mmol/L — ABNORMAL LOW (ref 1.15–1.40)
Chloride: 107 mmol/L (ref 98–111)
Creatinine, Ser: 0.9 mg/dL (ref 0.61–1.24)
Glucose, Bld: 93 mg/dL (ref 70–99)
HCT: 26 % — ABNORMAL LOW (ref 39.0–52.0)
Hemoglobin: 8.8 g/dL — ABNORMAL LOW (ref 13.0–17.0)
Potassium: 3.2 mmol/L — ABNORMAL LOW (ref 3.5–5.1)
Sodium: 139 mmol/L (ref 135–145)
TCO2: 20 mmol/L — ABNORMAL LOW (ref 22–32)

## 2023-06-23 LAB — TROPONIN I (HIGH SENSITIVITY)
Troponin I (High Sensitivity): 2 ng/L (ref <18)
Troponin I (High Sensitivity): 3 ng/L (ref <18)

## 2023-06-23 LAB — COMPREHENSIVE METABOLIC PANEL WITH GFR
ALT: 18 U/L (ref 0–44)
AST: 23 U/L (ref 15–41)
Albumin: 3 g/dL — ABNORMAL LOW (ref 3.5–5.0)
Alkaline Phosphatase: 83 U/L (ref 38–126)
Anion gap: 7 (ref 5–15)
BUN: 10 mg/dL (ref 8–23)
CO2: 22 mmol/L (ref 22–32)
Calcium: 7.5 mg/dL — ABNORMAL LOW (ref 8.9–10.3)
Chloride: 107 mmol/L (ref 98–111)
Creatinine, Ser: 0.93 mg/dL (ref 0.61–1.24)
GFR, Estimated: >60 mL/min (ref >60)
Glucose, Bld: 111 mg/dL — ABNORMAL HIGH (ref 70–99)
Potassium: 3.6 mmol/L (ref 3.5–5.1)
Sodium: 136 mmol/L (ref 135–145)
Total Bilirubin: 1.1 mg/dL (ref 0.0–1.2)
Total Protein: 5.6 g/dL — ABNORMAL LOW (ref 6.5–8.1)

## 2023-06-23 LAB — I-STAT CG4 LACTIC ACID, ED
Lactic Acid, Venous: 0.8 mmol/L (ref 0.5–1.9)
Lactic Acid, Venous: 0.5 mmol/L (ref 0.5–1.9)

## 2023-06-23 LAB — LIPASE, BLOOD: Lipase: 30 U/L (ref 11–51)

## 2023-06-23 MED ORDER — PROCHLORPERAZINE MALEATE 10 MG PO TABS: 10.0000 mg | ORAL_TABLET | Freq: Once | ORAL | Status: DC

## 2023-06-23 MED ORDER — SODIUM CHLORIDE 0.9 % IV SOLN
1000.0000 mg/m2 | Freq: Once | INTRAVENOUS | Status: AC
  Administered 2023-06-23: 2394 mg via INTRAVENOUS
  Filled 2023-06-23: qty 52.97

## 2023-06-23 MED ORDER — HYDROMORPHONE HCL 1 MG/ML IJ SOLN
1.0000 mg | Freq: Once | INTRAMUSCULAR | Status: AC
  Administered 2023-06-23: 1 mg via INTRAVENOUS
  Filled 2023-06-23: qty 1

## 2023-06-23 MED ORDER — IOHEXOL 350 MG/ML SOLN
100.0000 mL | Freq: Once | INTRAVENOUS | Status: AC | PRN
  Administered 2023-06-23: 100 mL via INTRAVENOUS

## 2023-06-23 MED ORDER — VINORELBINE TARTRATE CHEMO INJECTION 50 MG/5ML
25.0000 mg/m2 | Freq: Once | INTRAVENOUS | Status: AC
  Administered 2023-06-23: 60 mg via INTRAVENOUS
  Filled 2023-06-23: qty 5

## 2023-06-23 MED ORDER — OXYCODONE-ACETAMINOPHEN 5-325 MG PO TABS: 2.0000 | ORAL_TABLET | Freq: Four times a day (QID) | ORAL | 0 refills | Status: DC | PRN

## 2023-06-23 MED ORDER — SODIUM CHLORIDE 0.9 % IV SOLN
INTRAVENOUS | Status: DC
Start: 2023-06-23 — End: 2023-06-23

## 2023-06-23 NOTE — ED Notes (Signed)
 Informed PA Carolyn Cisco of patient's I-Stat Chem+8 results potassium 3.2.

## 2023-06-23 NOTE — Progress Notes (Signed)
 Established Patient Office Visit  Subjective   Patient ID: John [name], male    DOB: 12/24/1960  Age: 63 y.o. MRN: [id number]  Chief Complaint  Patient presents with   Follow-up    HPI    ROS    Objective:      BP 100/76 (Patient Position: Sitting)   Pulse (!) 101   Temp (!) 97.5 F (36.4 C) (Oral)   Resp 19   Ht 5\' 11"  (1.803 m)   Wt 237 lb (107.5 kg)   SpO2 93%   BMI 33.05 kg/m    Physical Exam   Results for orders placed or performed in visit on 06/23/23  CBC with Differential (Cancer Center Only)  Result Value Ref Range   WBC Count 6.4 4.0 - 10.5 K/uL   RBC 4.19 (L) 4.22 - 5.81 MIL/uL   Hemoglobin 12.7 (L) 13.0 - 17.0 g/dL   HCT 40.9 (L) 81.1 - 91.4 %   MCV 91.9 80.0 - 100.0 fL   MCH 30.3 26.0 - 34.0 pg   MCHC 33.0 30.0 - 36.0 g/dL   RDW 78.2 (H) 95.6 - 21.3 %   Platelet Count 353 150 - 400 K/uL   nRBC 0.0 0.0 - 0.2 %   Neutrophils Relative % 72 %   Neutro Abs 4.5 1.7 - 7.7 K/uL   Lymphocytes Relative 13 %   Lymphs Abs 0.9 0.7 - 4.0 K/uL   Monocytes Relative 13 %   Monocytes Absolute 0.8 0.1 - 1.0 K/uL   Eosinophils Relative 1 %   Eosinophils Absolute 0.1 0.0 - 0.5 K/uL   Basophils Relative 1 %   Basophils Absolute 0.1 0.0 - 0.1 K/uL   Immature Granulocytes 0 %   Abs Immature Granulocytes 0.02 0.00 - 0.07 K/uL  CMP (Cancer Center only)  Result Value Ref Range   Sodium 140 135 - 145 mmol/L   Potassium 4.0 3.5 - 5.1 mmol/L   Chloride 107 98 - 111 mmol/L   CO2 24 22 - 32 mmol/L   Glucose, Bld 103 (H) 70 - 99 mg/dL   BUN 9 8 - 23 mg/dL   Creatinine 0.86 5.78 - 1.24 mg/dL   Calcium  8.8 (L) 8.9 - 10.3 mg/dL   Total Protein 6.7 6.5 - 8.1 g/dL   Albumin  4.0 3.5 - 5.0 g/dL   AST 18 15 - 41 U/L   ALT 18 0 - 44 U/L   Alkaline Phosphatase 111 38 - 126 U/L   Total Bilirubin 0.7 0.0 - 1.2 mg/dL   GFR, Estimated >46 >96 mL/min   Anion gap 9 5 - 15      The ASCVD Risk score (Arnett DK, et al., 2019) failed to calculate for the following  reasons:   The valid total cholesterol range is 130 to 320 mg/dL    Assessment & Plan:   Problem List Items Addressed This Visit       Oncology   Stage 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 (HCC) (Chronic)   Relevant Orders   Infusion Appointment Request   Lab Appointment Request   CBC with Differential (Cancer Center Only)   CMP (Cancer Center only)   CBC with Differential (Cancer Center Only)   CMP (Cancer Center only)   Lactate dehydrogenase   Malignant neoplasm of lung (HCC) - Primary (Chronic)   Relevant Orders   Infusion Appointment Request   Lab Appointment Request   CBC with Differential (Cancer Center Only)   CMP (Cancer Center only)  CBC with Differential (Cancer Center Only)   CMP (Cancer Center only)   Lactate dehydrogenase    Return in about 4 weeks (around 07/21/2023) for md labs, appt, chemotherapy.    Ivor Mars, MD

## 2023-06-23 NOTE — ED Notes (Signed)
 Patient transported to CT

## 2023-06-23 NOTE — Discharge Instructions (Signed)
 You are seen in the emerged from for chest pain back pain after chemotherapy Your CAT scan and blood work all looked okay Your pain improved after some time here in the ED Follow-up with your oncology team Return to the emergency room for severe pain

## 2023-06-23 NOTE — ED Provider Notes (Signed)
 [place] EMERGENCY DEPARTMENT AT [facility] Provider Note   CSN: [id number] Arrival date & time: 06/23/23  1820     History  Chief Complaint  Patient presents with   Generalized Body Aches   Weakness    John [name] is a 63 y.o. male.  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  Patient underwent his scheduled chemotherapy this morning and several hours later began to develop diffuse bodyaches with chest pain back pain and diffuse generalized weakness.  Some nausea but no vomiting.  No fevers or chills.   Weakness      Home Medications Prior to Admission medications   Medication Sig Start Date End Date Taking? Authorizing Provider  acetaminophen  (TYLENOL ) 500 MG tablet Take 500 mg by mouth daily as needed for moderate pain (pain score 4-6), fever or headache.    [provider]  amiodarone  (PACERONE ) 200 MG tablet START by taking 2 tablets (400mg ) daily for 2 weeks THEN reduce to 1 tablet (200mg ) daily Patient taking differently: Take 200 mg by mouth daily. 04/02/23   Ursuy, Renee Lynn, PA-C  benzonatate  (TESSALON ) 200 MG capsule Take 1 capsule (200 mg total) by mouth 3 (three) times daily as needed. 06/02/23 06/01/24  Parrett, Macdonald Savoy, NP  ELIQUIS  5 MG TABS tablet TAKE 1 TABLET(5 MG) BY MOUTH TWICE DAILY Patient taking differently: Take 5 mg by mouth 2 (two) times daily. 05/12/23   Jacqueline Matsu, MD  Evolocumab  (REPATHA  SURECLICK) 140 MG/ML SOAJ ADMINISTER 1 ML UNDER THE SKIN EVERY 14 DAYS 05/16/23   Jacqueline Matsu, MD  lidocaine  (LIDODERM ) 5 % Place 1 patch onto the skin daily. Remove & Discard patch within 12 hours or as directed by MD 04/29/23   Ivor Mars, MD  metoprolol  succinate (TOPROL -XL) 25 MG 24 hr tablet TAKE 1 TABLET(25 MG) BY MOUTH DAILY 05/18/23   Jacqueline Matsu, MD  ondansetron  (ZOFRAN ) 8 MG tablet Take 1 tablet (8 mg  total) by mouth every 8 (eight) hours as needed for nausea or vomiting. 05/26/23   Ivor Mars, MD  oxyCODONE -acetaminophen  (PERCOCET/ROXICET) 5-325 MG tablet Take 2 tablets by mouth every 6 (six) hours as needed. 06/23/23   Ivor Mars, MD  PARoxetine  (PAXIL ) 10 MG tablet Take 1 tablet (10 mg total) by mouth at bedtime. 01/11/23   Dorthy Gavia, MD  polyethylene glycol (MIRALAX  / GLYCOLAX ) 17 g packet Take 17 g by mouth daily as needed for mild constipation. 06/09/23   Lonita Roach, MD  predniSONE  (DELTASONE ) 5 MG tablet Take 1 tablet (5 mg total) by mouth daily with breakfast. 08/18/22   Ivor Mars, MD  prochlorperazine  (COMPAZINE ) 10 MG tablet Take 1 tablet (10 mg total) by mouth every 6 (six) hours as needed for nausea or vomiting. 05/26/23   Ivor Mars, MD  TRELEGY ELLIPTA  100-62.5-25 MCG/ACT AEPB INHALE 1 PUFF INTO THE LUNGS DAILY 06/20/23   Denson Flake, MD      Allergies    Ventolin  [albuterol ], Lipitor [atorvastatin ], and Oxycodone     Review of Systems   Review of Systems  Neurological:  Positive for weakness.    Physical Exam Updated Vital Signs BP 103/61   Pulse 73   Temp (!) 97.5 F (36.4 C) (Oral)   Resp 11   Ht 5\' 11"  (1.803 m)   Wt 107 kg  SpO2 97%   BMI 32.90 kg/m  Physical Exam Vitals and nursing note reviewed.  HENT:     Head: Normocephalic and atraumatic.  Eyes:     Pupils: Pupils are equal, round, and reactive to light.  Cardiovascular:     Rate and Rhythm: Normal rate and regular rhythm.  Pulmonary:     Effort: Pulmonary effort is normal.     Breath sounds: Normal breath sounds.  Abdominal:     Palpations: Abdomen is soft.     Tenderness: There is no abdominal tenderness.  Skin:    General: Skin is warm and dry.  Neurological:     Mental Status: He is alert.  Psychiatric:        Mood and Affect: Mood normal.     ED Results / Procedures / Treatments   Labs (all labs ordered are listed, but only abnormal results are  displayed) Labs Reviewed  CBC WITH DIFFERENTIAL/PLATELET - Abnormal; Notable for the following components:      Result Value   RBC 3.42 (*)    Hemoglobin 10.4 (*)    HCT 33.0 (*)    RDW 19.9 (*)    Lymphs Abs 0.3 (*)    All other components within normal limits  COMPREHENSIVE METABOLIC PANEL WITH GFR - Abnormal; Notable for the following components:   Glucose, Bld 111 (*)    Calcium  7.5 (*)    Total Protein 5.6 (*)    Albumin  3.0 (*)    All other components within normal limits  I-STAT CHEM 8, ED - Abnormal; Notable for the following components:   Potassium 3.2 (*)    BUN 7 (*)    Calcium , Ion 0.98 (*)    TCO2 20 (*)    Hemoglobin 8.8 (*)    HCT 26.0 (*)    All other components within normal limits  CULTURE, BLOOD (ROUTINE X 2)  CULTURE, BLOOD (ROUTINE X 2)  LIPASE, BLOOD  I-STAT CG4 LACTIC ACID, ED  I-STAT CG4 LACTIC ACID, ED  TROPONIN I (HIGH SENSITIVITY)  TROPONIN I (HIGH SENSITIVITY)    EKG EKG Interpretation Date/Time:  Thursday Jun 23 2023 18:37:30 EDT Ventricular Rate:  94 PR Interval:    QRS Duration:  109 QT Interval:  390 QTC Calculation: 488 R Axis:   -78  Text Interpretation: Normal sinus rhythm Incomplete RBBB and LAFB Low voltage, precordial leads Consider right ventricular hypertrophy Consider anterior infarct Confirmed by Rafael Bun [phone]) on 06/23/2023 11:35:18 PM  Radiology CT Angio Chest/Abd/Pel for Dissection W and/or Wo Contrast Result Date: 06/23/2023 CLINICAL DATA:  Weakness, body aches, nausea, lung cancer, chemotherapy today * Tracking Code: BO * EXAM: CT ANGIOGRAPHY CHEST, ABDOMEN AND PELVIS TECHNIQUE: Non-contrast CT of the chest was initially obtained. Multidetector CT imaging through the chest, abdomen and pelvis was performed using the standard protocol during bolus administration of intravenous contrast. Multiplanar reconstructed images and MIPs were obtained and reviewed to evaluate the vascular anatomy. RADIATION DOSE REDUCTION: This  exam was performed according to the departmental dose-optimization program which includes automated exposure control, adjustment of the mA and/or kV according to patient size and/or use of iterative reconstruction technique. CONTRAST:  OMNIPAQUE  IOHEXOL  350 MG/ML SOLN COMPARISON:  MR abdomen, 06/07/2023, CT chest abdomen pelvis angiogram, 06/06/2023 FINDINGS: CTA CHEST FINDINGS VASCULAR Aorta: Satisfactory opacification of the aorta. Normal contour and caliber of the thoracic aorta. No evidence of aneurysm, dissection, or other acute aortic pathology. Mild mixed calcific atherosclerosis Cardiovascular: No evidence of pulmonary embolism on limited  non-tailored examination. Mild cardiomegaly. Left coronary artery calcifications. No pericardial effusion. Review of the MIP images confirms the above findings. NON VASCULAR Mediastinum/Nodes: No enlarged mediastinal, hilar, or axillary lymph nodes. Thyroid  gland, trachea, and esophagus demonstrate no significant findings. Lungs/Pleura: Unchanged post treatment appearance of the chest with dense consolidation and fibrosis of the perihilar right lung and right lower lobe, as well as some evidence of cavitary change in the right lower lobe (series 9, image 67). Mild underlying UIP pattern pulmonary fibrosis as well as moderate emphysema. No pleural effusion or pneumothorax. Musculoskeletal: No chest wall abnormality. No acute osseous findings. Review of the MIP images confirms the above findings. CTA ABDOMEN AND PELVIS FINDINGS VASCULAR Unchanged infrarenal abdominal aortic aneurysm measuring 3.3 x 3.1 cm (series 13, image 197). Otherwise normal contour and caliber of the abdominal aorta. Moderate mixed calcific atherosclerosis. No evidence of acute aortic pathology. Standard branching pattern of the abdominal aorta with solitary bilateral renal arteries. Review of the MIP images confirms the above findings. NON-VASCULAR Hepatobiliary: No focal liver abnormality is  seen. Status post interval cholecystectomy with minimal residual fluid in the gallbladder fossa (series 13, image 115). No biliary dilatation. Pancreas: Diffuse fatty atrophy of the pancreatic parenchyma. No pancreatic ductal dilatation or surrounding inflammatory changes. Spleen: Mild splenomegaly, maximum coronal span 13.1 cm. Adrenals/Urinary Tract: Unchanged right adrenal mass (series 13, image 126). Kidneys are normal, without renal calculi, solid lesion, or hydronephrosis. Bladder is unremarkable. Stomach/Bowel: Stomach is within normal limits. Appendix appears normal. No evidence of bowel wall thickening, distention, or inflammatory changes. Lymphatic: No enlarged abdominal or pelvic lymph nodes. Reproductive: No mass or other significant abnormality. Other: Small fat containing left inguinal hernia.  No ascites. Musculoskeletal: No acute osseous findings. IMPRESSION: 1. No evidence of thoracic aortic aneurysm, dissection, or other acute aortic pathology. 2. Unchanged infrarenal abdominal aortic aneurysm measuring 3.3 x 3.1 cm. No acute pathology. Moderate aortic atherosclerosis. 3. Status post interval cholecystectomy with minimal residual postoperative fluid in the gallbladder fossa. No biliary ductal dilatation. 4. Unchanged post treatment appearance of the chest with dense consolidation and fibrosis of the perihilar right lung and right lower lobe, as well as some evidence of cavitary change in the right lower lobe. Mild underlying UIP pattern pulmonary fibrosis as well as moderate emphysema. 5. Unchanged right adrenal metastasis. 6. Coronary artery disease. Aortic Atherosclerosis (ICD10-I70.0) and Emphysema (ICD10-J43.9). Electronically Signed   By: Fredricka Jenny M.D.   On: 06/23/2023 20:28   DG Chest Port 1 View Result Date: 06/23/2023 CLINICAL DATA:  Chest pain. EXAM: PORTABLE CHEST 1 VIEW COMPARISON:  06/06/2023 CT and plain film. FINDINGS: Volume loss on the right. Increasing airspace disease in  the right mid and lower lung. Fibrotic changes noted in the lung bases, similar to prior study. Heart and mediastinal contours within normal limits. No visible effusions or pneumothorax. IMPRESSION: Fibrotic changes in the lung bases. Worsening airspace disease in the right mid and lower lung. Electronically Signed   By: Janeece Mechanic M.D.   On: 06/23/2023 19:43    Procedures Procedures    Medications Ordered in ED Medications  HYDROmorphone  (DILAUDID ) injection 1 mg (has no administration in time range)  HYDROmorphone  (DILAUDID ) injection 1 mg (1 mg Intravenous Given 06/23/23 1932)  iohexol  (OMNIPAQUE ) 350 MG/ML injection 100 mL (100 mLs Intravenous Contrast Given 06/23/23 1945)    ED Course/ Medical Decision Making/ A&P Clinical Course as of 06/23/23 2348  Thu Jun 23, 2023  2347 CTA chest abdomen pelvis shows no evidence  of dissection.  Stable aneurysm.  Stable chronic findings.  Patient remains in normal sinus rhythm.  Hemodynamically stable.  Pain is improved.  Laboratory workup at baseline.  Feels well enough to go home.  Will follow-up with his oncology team [MP]    Clinical Course User Index [MP] Sallyanne Creamer, DO                                 Medical Decision Making 63 year old male with history as above presenting for acute onset chest pain with radiation to the back and diffuse bodyaches after chemo this morning.  Very uncomfortable appearing upon my initial assessment.  Hemodynamically stable.  Presentation concerning for potential aortic dissection given history of known aneurysm.  Will obtain CTA chest abdomen pelvis dissection protocol along with laboratory workup.  Other differentials to consider would be pneumonia dysrhythmia ACS electrolyte imbalance.  Will continue to monitor on telemetry  Risk Prescription drug management.           Final Clinical Impression(s) / ED Diagnoses Final diagnoses:  Chest pain, unspecified type    Rx / DC Orders ED Discharge  Orders     None         Sallyanne Creamer, DO 06/23/23 2348

## 2023-06-23 NOTE — ED Triage Notes (Signed)
 Patient c/o weakness x 1 day. Patient just had chemo done this morning. Patient report worsening weakness and body aches tonight. Patient report nausea, denies vomiting. Hx Lung CA

## 2023-06-23 NOTE — Progress Notes (Signed)
 Hold Xgeva  today per MD.  Elodia Hailstone, RPH, BCPS, BCOP 06/23/2023 10:28 AM

## 2023-06-23 NOTE — ED Provider Triage Note (Signed)
 Emergency Medicine Provider Triage Evaluation Note  John [name] , a 63 y.o. male  was evaluated in triage.  Pt complains of severe back pain, full body pain, chest pain and feeling very weak, started 2 hours ago. Reports fever and chills at home.   Review of Systems  Positive: pain Negative: NV  Physical Exam  BP 98/64   Pulse 94   Temp (!) 97.5 F (36.4 C) (Oral)   Resp (!) 21   Ht 5\' 11"  (1.803 m)   Wt 107 kg   SpO2 92%   BMI 32.90 kg/m  Gen:   Awake, no distress   Resp:  Normal effort  MSK:   Moves extremities without difficulty  Other:  Radial pulse equal bilaterally   Medical Decision Making  Medically screening exam initiated at 6:49 PM.  Appropriate orders placed.  Grabiel Whetsell was informed that the remainder of the evaluation will be completed by another provider, this initial triage assessment does not replace that evaluation, and the importance of remaining in the ED until their evaluation is complete.     Eudora Heron, PA-C 06/23/23 1851

## 2023-06-23 NOTE — Patient Instructions (Signed)
 [facility] - A DEPT OF [facility]  Discharge Instructions: Thank you for choosing [facility] to provide your oncology and hematology care.   If you have a lab appointment with the Cancer Center, please go directly to the Cancer Center and check in at the registration area.  Wear comfortable clothing and clothing appropriate for easy access to any Portacath or PICC line.   We strive to give you quality time with your provider. You may need to reschedule your appointment if you arrive late (15 or more minutes).  Arriving late affects you and other patients whose appointments are after yours.  Also, if you miss three or more appointments without notifying the office, you may be dismissed from the clinic at the provider's discretion.      For prescription refill requests, have your pharmacy contact our office and allow 72 hours for refills to be completed.    Today you received the following chemotherapy and/or immunotherapy agents Navelbine/Gemzar      To help prevent nausea and vomiting after your treatment, we encourage you to take your nausea medication as directed.  BELOW ARE SYMPTOMS THAT SHOULD BE REPORTED IMMEDIATELY: *FEVER GREATER THAN 100.4 F (38 C) OR HIGHER *CHILLS OR SWEATING *NAUSEA AND VOMITING THAT IS NOT CONTROLLED WITH YOUR NAUSEA MEDICATION *UNUSUAL SHORTNESS OF BREATH *UNUSUAL BRUISING OR BLEEDING *URINARY PROBLEMS (pain or burning when urinating, or frequent urination) *BOWEL PROBLEMS (unusual diarrhea, constipation, pain near the anus) TENDERNESS IN MOUTH AND THROAT WITH OR WITHOUT PRESENCE OF ULCERS (sore throat, sores in mouth, or a toothache) UNUSUAL RASH, SWELLING OR PAIN  UNUSUAL VAGINAL DISCHARGE OR ITCHING   Items with * indicate a potential emergency and should be followed up as soon as possible or go to the Emergency Department if any problems should occur.  Please show the CHEMOTHERAPY ALERT CARD or  IMMUNOTHERAPY ALERT CARD at check-in to the Emergency Department and triage nurse. Should you have questions after your visit or need to cancel or reschedule your appointment, please contact [facility] - A DEPT OF [facility]  [phone] and follow the prompts.  Office hours are 8:00 a.m. to 4:30 p.m. Monday - Friday. Please note that voicemails left after 4:00 p.m. may not be returned until the following business day.  We are closed weekends and major holidays. You have access to a nurse at all times for urgent questions. Please call the main number to the clinic [phone] and follow the prompts.  For any non-urgent questions, you may also contact your provider using MyChart. We now offer e-Visits for anyone 48 and older to request care online for non-urgent symptoms. For details visit [email or web address].   Also download the MyChart app! Go to the app store, search "MyChart", open the app, select [place], and log in with your MyChart username and password.  You can take another prochloraperazine at 4:30 pm today if needed for nausea. Then as directed.   Or you can take a ondansetron if needed for nausea anytime today and then as directed.

## 2023-06-23 NOTE — Progress Notes (Signed)
 [facility] [name] + Hematology and Oncology Follow Up Visit  John [name] [id number] 12-18-1960 63 y.o. 06/23/2023   Principle Diagnosis:  Metastatic squamous cell carcinoma of the right upper lung-lymph node and adrenal metastasis   Current Therapy:        Pembrolizumab  200 mg IV every 3 weeks -- s/p cycle 10 -- d/c due to pulmonary toxicity Taxotere  60mg /m2 IV q 3 weeks -- s/p cycle #5 -- start on 01/29/2021 --DC on 07/20/2021 due to progression Xgeva  120 mg IM q. 3 months --next dose in 06/2023  XRT -right adrenal gland.  2 finish on 04/04/2023 Gemzar /Navelbine  -s/p cycle 1  - start on 05/26/2023    Interim History:  John [name] is here today for follow-up.  He recent was in the hospital.  He had his gallbladder taken out.  He was having right upper quadrant abdominal pain.  Then the LFTs were elevated.  He underwent gallbladder resection on 06/08/2023.  No problems were noted with surgery.  He had no complications afterwards.  His back is hurting a little bit.  This is in the lower back.  This may be secondary to the steroids that he has been taking.  His appetite is come back quite nicely.  This improved after he had his gallbladder removed.  Again the pathology on the gallbladder only showed chronic cholecystitis.  He is still on oxygen.  The oxygen helps.  He has had no problems with rashes.  He has had no bleeding.  He has had no change in bowel or bladder habits.  He has had no fever.  Overall, I would say that his performance status is probably ECOG 1.     Medications:  Allergies as of 06/23/2023       Reactions   Ventolin  [albuterol ] Other (See Comments)   Patient has had episode of atrial fib following administration of albuterol  (tolerates Xopenex  well)   Lipitor [atorvastatin ] Other (See Comments)   Arthralgias    Oxycodone  Other (See Comments)   Severe Migranes        Medication List        Accurate as of Jun 23, 2023  9:16 AM. If you have any questions, ask your  nurse or doctor.          acetaminophen  500 MG tablet Commonly known as: TYLENOL  Take 500 mg by mouth daily as needed for moderate pain (pain score 4-6), fever or headache.   amiodarone  200 MG tablet Commonly known as: PACERONE  START by taking 2 tablets (400mg ) daily for 2 weeks THEN reduce to 1 tablet (200mg ) daily What changed: how much to take   benzonatate  200 MG capsule Commonly known as: TESSALON  Take 1 capsule (200 mg total) by mouth 3 (three) times daily as needed.   Eliquis  5 MG Tabs tablet Generic drug: apixaban  TAKE 1 TABLET(5 MG) BY MOUTH TWICE DAILY What changed: See the new instructions.   lidocaine  5 % Commonly known as: LIDODERM  Place 1 patch onto the skin daily. Remove & Discard patch within 12 hours or as directed by MD   metoprolol  succinate 25 MG 24 hr tablet Commonly known as: TOPROL -XL TAKE 1 TABLET(25 MG) BY MOUTH DAILY   ondansetron  8 MG tablet Commonly known as: ZOFRAN  Take 1 tablet (8 mg total) by mouth every 8 (eight) hours as needed for nausea or vomiting.   oxyCODONE -acetaminophen  5-325 MG tablet Commonly known as: PERCOCET/ROXICET Take 2 tablets by mouth every 6 (six) hours as needed.   PARoxetine  10 MG tablet  Commonly known as: PAXIL  Take 1 tablet (10 mg total) by mouth at bedtime.   polyethylene glycol 17 g packet Commonly known as: MIRALAX  / GLYCOLAX  Take 17 g by mouth daily as needed for mild constipation.   predniSONE  5 MG tablet Commonly known as: DELTASONE  Take 1 tablet (5 mg total) by mouth daily with breakfast.   prochlorperazine  10 MG tablet Commonly known as: COMPAZINE  Take 1 tablet (10 mg total) by mouth every 6 (six) hours as needed for nausea or vomiting.   Repatha  SureClick 140 MG/ML Soaj Generic drug: Evolocumab  ADMINISTER 1 ML UNDER THE SKIN EVERY 14 DAYS   Trelegy Ellipta  100-62.5-25 MCG/ACT Aepb Generic drug: Fluticasone -Umeclidin-Vilant INHALE 1 PUFF INTO THE LUNGS DAILY        Allergies:  Allergies   Allergen Reactions   Ventolin  [Albuterol ] Other (See Comments)    Patient has had episode of atrial fib following administration of albuterol  (tolerates Xopenex  well)   Lipitor [Atorvastatin ] Other (See Comments)    Arthralgias    Oxycodone  Other (See Comments)    Severe Migranes    Past Medical History, Surgical history, Social history, and Family History were reviewed and updated.  Review of Systems: Review of Systems  Constitutional:  Positive for malaise/fatigue.  HENT: Negative.    Eyes: Negative.   Respiratory:  Positive for cough and shortness of breath.   Cardiovascular:  Positive for chest pain.  Gastrointestinal: Negative.   Genitourinary: Negative.   Musculoskeletal:  Positive for joint pain.  Skin: Negative.   Neurological:  Positive for focal weakness.  Endo/Heme/Allergies: Negative.   Psychiatric/Behavioral: Negative.       Physical Exam:  Temperature is 97.5.  Pulse 101.  Blood pressure 100/76.  Weight is 237 pounds.   Wt Readings from Last 3 Encounters:  06/20/23 237 lb (107.5 kg)  06/08/23 250 lb (113.4 kg)  05/09/23 249 lb (112.9 kg)    Physical Exam Vitals reviewed.  HENT:     Head: Normocephalic and atraumatic.  Eyes:     Pupils: Pupils are equal, round, and reactive to light.  Cardiovascular:     Rate and Rhythm: Normal rate and regular rhythm.     Heart sounds: Normal heart sounds.  Pulmonary:     Effort: Pulmonary effort is normal.     Breath sounds: Normal breath sounds.  Abdominal:     General: Bowel sounds are normal.     Palpations: Abdomen is soft.  Musculoskeletal:        General: No tenderness or deformity. Normal range of motion.     Cervical back: Normal range of motion.  Lymphadenopathy:     Cervical: No cervical adenopathy.  Skin:    General: Skin is warm and dry.     Findings: No erythema or rash.  Neurological:     Mental Status: He is alert and oriented to person, place, and time.  Psychiatric:        Behavior:  Behavior normal.        Thought Content: Thought content normal.        Judgment: Judgment normal.     Lab Results  Component Value Date   WBC 6.4 06/23/2023   HGB 12.7 (L) 06/23/2023   HCT 38.5 (L) 06/23/2023   MCV 91.9 06/23/2023   PLT 353 06/23/2023   Lab Results  Component Value Date   FERRITIN 446 (H) 08/23/2021   IRON 189 (H) 08/26/2021   TIBC 213 (L) 08/26/2021   UIBC 24 08/26/2021   IRONPCTSAT  89 (H) 08/26/2021   Lab Results  Component Value Date   RETICCTPCT 1.9 08/23/2021   RBC 4.19 (L) 06/23/2023   No results found for: "KPAFRELGTCHN", "LAMBDASER", "KAPLAMBRATIO" No results found for: "IGGSERUM", "IGA", "IGMSERUM" No results found for: "TOTALPROTELP", "ALBUMINELP", "A1GS", "A2GS", "BETS", "BETA2SER", "GAMS", "MSPIKE", "SPEI"   Chemistry      Component Value Date/Time   NA 135 06/09/2023 0358   NA 139 04/12/2023 0937   K 4.3 06/09/2023 0358   CL 103 06/09/2023 0358   CO2 23 06/09/2023 0358   BUN 8 06/09/2023 0358   BUN 12 04/12/2023 0937   CREATININE 0.89 06/09/2023 0358   CREATININE 1.15 06/02/2023 0937      Component Value Date/Time   CALCIUM  7.5 (L) 06/09/2023 0358   ALKPHOS 100 06/09/2023 0358   AST 56 (H) 06/09/2023 0358   AST 181 (HH) 06/02/2023 0937   ALT 52 (H) 06/09/2023 0358   ALT 127 (H) 06/02/2023 0937   BILITOT 0.8 06/09/2023 0358   BILITOT 1.2 06/02/2023 0937       Impression and Plan: John [name] is a very pleasant 62 yo gentleman with metastatic squamous cell carcinoma of the right lung.  The tumor had high PD-L1 score.  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  I am sad that he had the gallbladder incident.  I am glad that he had taken.  There were no complications from the surgery.  We will go ahead with his second cycle of treatment.  I think after 3 cycles, we will then do a scan on him.  Again, this is all about quality of life.  I just want him to be able to enjoy what he wants to  do at home.  I think he still is working a little bit.  He has a new grandfather.  We will plan to get him back to see us  in another month.    Ivor Mars, MD 5/8/20259:16 AM

## 2023-06-24 ENCOUNTER — Telehealth: Payer: Self-pay | Admitting: *Deleted

## 2023-06-24 NOTE — Telephone Encounter (Signed)
 Message received from patient to inform Dr. Maria Shiner that he was in the ER yesterday evening with "bone pain that hurt all over."  Dr. Maria Shiner notified and pt.'s chart reviewed by Dr. Maria Shiner.  Call placed back to patient and patient notified per order of Dr. Maria Shiner to come in today to the office for IV pain meds and steroids.  Pt states that he does not want to come in and will take the prescribed Percocet for his pain.  Pt instructed to call office back if pain is not relieved with Percocet.  Pt is appreciative of call back and has no questions at this time.

## 2023-06-25 ENCOUNTER — Other Ambulatory Visit: Payer: Self-pay

## 2023-06-28 ENCOUNTER — Telehealth (INDEPENDENT_AMBULATORY_CARE_PROVIDER_SITE_OTHER): Admitting: Adult Health

## 2023-06-28 DIAGNOSIS — J441 Chronic obstructive pulmonary disease with (acute) exacerbation: Secondary | ICD-10-CM | POA: Diagnosis not present

## 2023-06-28 LAB — CULTURE, BLOOD (ROUTINE X 2)
Culture: NO GROWTH
Culture: NO GROWTH
Special Requests: ADEQUATE
Special Requests: ADEQUATE

## 2023-06-28 MED ORDER — HYDROCODONE BIT-HOMATROP MBR 5-1.5 MG/5ML PO SOLN: 5.0000 mL | Freq: Four times a day (QID) | ORAL | 0 refills | Status: DC | PRN

## 2023-06-28 NOTE — Patient Instructions (Addendum)
 Decrease Prednisone  20mg  daily until seen by Oncology on 07/01/23.  Delsym 2 tsp Twice daily as needed for cough Tessalon  Perles 3 times daily as needed for cough Hydromet 1 teaspoon every 6 hours as needed for severe cough.  Use with caution as this causes sedation and balance issues.- do not take this with your other pain meds.  Continue on Oxygen 3l/m, goal is keep O2 sats >88-90%.  Follow up with Oncology as planned this week.  Follow up with Dr. Baldwin Levee  in 6-8 weeks and As needed   Please contact office for sooner follow up if symptoms do not improve or worsen or seek emergency care

## 2023-06-28 NOTE — Progress Notes (Unsigned)
 Virtual Visit via Video Note  I connected with John [name] on 06/28/23 at 11:30 AM EDT by a video enabled telemedicine application and verified that I am speaking with the correct person using two identifiers.  Location: Patient: Home  Provider: Office    I discussed the limitations of evaluation and management by telemedicine and the availability of in person appointments. The patient expressed understanding and agreed to proceed.  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  History of pneumonitis secondary to Keytruda  requiring prolonged steroid course baseline dose of prednisone  5 to 10 mg.  Chronic respiratory failure on home oxygen.  History of recurrent right-sided pneumothorax requiring prolonged hospitalization Medical history significant for atrial fibs on Eliquis  and rheumatoid arthritis  Today's video visit is a follow up from recent ER visit.  Patient presented with atypical chest pain after chemotherapy.  Had nausea.  Workup was unrevealing for acute process.  ER notes were reviewed.  CT chest, abdomen and pelvis negative for acute process.  Ruled out for acute coronary syndrome.  Was given pain medication with significant improvement.  Patient complains of ongoing cough that is minimally productive.  Tries to use Delsym for cough but only thing that helps to get him any relief is Hydromet cough syrup.  He does use pain medications from oncology but has cut back .  Currently using Percocet on occasion.  Previously was on fentanyl  pain patch -not currently using.  Admitted last month for acute cholecystitis status post cholecystectomy.  Currently on Trelegy inhaler daily.Aaron Aas  Has increased his prednisone  up to 40 mg daily. Remains on oxygen 3 L.  As above patient continues to follow with oncology.  He has  metastatic squamous cell carcinoma of the right lung.  He is currently undergoing active therapy with Gemzar  and Navelbine .  Says he remains very weak with no significant energy.  CT chest Jun 23, 2023 showed unchanged posttreatment appearance of the chest with dense consolidation and fibrosis of the perihilar right lung and right lower lobe cavitary changes in the right lower lobe.  Mild underlying UIP pulmonary fibrosis pattern.  Moderate emphysema.  Unchanged right adrenal metastasis.  Observations/Objective: Sitting at home on O2 , NAD, speaks in full sentences   Assessment and Plan:   Follow Up Instructions: COPD with emphysema-recent exacerbation.  Recommend patient decrease prednisone  to 20 mg daily for the next 3 days then resume 10 mg daily dosing.  Add liquid Mucinex DM twice daily.  Tessalon  as needed.  Will refill Hydromet cough syrup to use cautiously and not to combine with other pain medications.  Patient advised of potential for sedating effects. Continue on Trelegy daily.   Chronic respiratory failure.  Continue on oxygen to maintain O2 saturations greater than 90%.  Metastatic lung cancer continue current regimen and follow-up with oncology.  Plan  Patient Instructions  Decrease Prednisone  20mg  daily until seen by Oncology on 07/01/23.  Delsym 2 tsp Twice daily as needed for cough Tessalon  Perles 3 times daily as needed for cough Hydromet 1 teaspoon every 6 hours as needed for severe cough.  Use with caution as this causes sedation and balance issues.- do not take this with your other pain meds.  Continue on Oxygen 3l/m, goal is keep O2 sats >88-90%.  Follow up with Oncology as planned this week.  Follow up with Dr. Byrum  in 6-8 weeks and As needed   Please contact office for sooner follow up if symptoms do not improve or worsen or seek emergency care       I discussed the assessment and treatment plan with the patient. The patient was provided an opportunity to ask  questions and all were answered. The patient agreed with the plan and demonstrated an understanding of the instructions.   The patient was advised to call back or seek an in-person evaluation if the symptoms worsen or if the condition fails to improve as anticipated.  I provided 30 minutes of non-face-to-face time during this encounter.   Roena Clark, NP

## 2023-07-01 ENCOUNTER — Inpatient Hospital Stay

## 2023-07-01 VITALS — BP 104/64 | HR 90 | Resp 19

## 2023-07-01 DIAGNOSIS — C3410 Malignant neoplasm of upper lobe, unspecified bronchus or lung: Secondary | ICD-10-CM

## 2023-07-01 DIAGNOSIS — C3401 Malignant neoplasm of right main bronchus: Secondary | ICD-10-CM

## 2023-07-01 DIAGNOSIS — E1169 Type 2 diabetes mellitus with other specified complication: Secondary | ICD-10-CM

## 2023-07-01 DIAGNOSIS — S22080A Wedge compression fracture of T11-T12 vertebra, initial encounter for closed fracture: Secondary | ICD-10-CM

## 2023-07-01 DIAGNOSIS — C3491 Malignant neoplasm of unspecified part of right bronchus or lung: Secondary | ICD-10-CM

## 2023-07-01 DIAGNOSIS — C3400 Malignant neoplasm of unspecified main bronchus: Secondary | ICD-10-CM

## 2023-07-01 DIAGNOSIS — J189 Pneumonia, unspecified organism: Secondary | ICD-10-CM | POA: Diagnosis not present

## 2023-07-01 DIAGNOSIS — R110 Nausea: Secondary | ICD-10-CM

## 2023-07-01 DIAGNOSIS — D702 Other drug-induced agranulocytosis: Secondary | ICD-10-CM

## 2023-07-01 DIAGNOSIS — D649 Anemia, unspecified: Secondary | ICD-10-CM

## 2023-07-01 DIAGNOSIS — R52 Pain, unspecified: Secondary | ICD-10-CM | POA: Diagnosis not present

## 2023-07-01 DIAGNOSIS — I480 Paroxysmal atrial fibrillation: Secondary | ICD-10-CM

## 2023-07-01 DIAGNOSIS — C3411 Malignant neoplasm of upper lobe, right bronchus or lung: Secondary | ICD-10-CM

## 2023-07-01 LAB — CBC WITH DIFFERENTIAL (CANCER CENTER ONLY)
Abs Immature Granulocytes: 0.04 10*3/uL (ref 0.00–0.07)
Basophils Absolute: 0 10*3/uL (ref 0.0–0.1)
Basophils Relative: 0 %
Eosinophils Absolute: 0 10*3/uL (ref 0.0–0.5)
Eosinophils Relative: 0 %
HCT: 36.8 % — ABNORMAL LOW (ref 39.0–52.0)
Hemoglobin: 12.6 g/dL — ABNORMAL LOW (ref 13.0–17.0)
Immature Granulocytes: 1 %
Lymphocytes Relative: 6 %
Lymphs Abs: 0.3 10*3/uL — ABNORMAL LOW (ref 0.7–4.0)
MCH: 30.1 pg (ref 26.0–34.0)
MCHC: 34.2 g/dL (ref 30.0–36.0)
MCV: 88 fL (ref 80.0–100.0)
Monocytes Absolute: 0.1 10*3/uL (ref 0.1–1.0)
Monocytes Relative: 2 %
Neutro Abs: 3.9 10*3/uL (ref 1.7–7.7)
Neutrophils Relative %: 91 %
Platelet Count: 259 10*3/uL (ref 150–400)
RBC: 4.18 MIL/uL — ABNORMAL LOW (ref 4.22–5.81)
RDW: 18 % — ABNORMAL HIGH (ref 11.5–15.5)
WBC Count: 4.3 10*3/uL (ref 4.0–10.5)
nRBC: 0 % (ref 0.0–0.2)

## 2023-07-01 LAB — CMP (CANCER CENTER ONLY)
ALT: 55 U/L — ABNORMAL HIGH (ref 0–44)
AST: 42 U/L — ABNORMAL HIGH (ref 15–41)
Albumin: 4.3 g/dL (ref 3.5–5.0)
Alkaline Phosphatase: 84 U/L (ref 38–126)
Anion gap: 11 (ref 5–15)
BUN: 21 mg/dL (ref 8–23)
CO2: 24 mmol/L (ref 22–32)
Calcium: 8.9 mg/dL (ref 8.9–10.3)
Chloride: 97 mmol/L — ABNORMAL LOW (ref 98–111)
Creatinine: 1.04 mg/dL (ref 0.61–1.24)
GFR, Estimated: >60 mL/min (ref >60)
Glucose, Bld: 176 mg/dL — ABNORMAL HIGH (ref 70–99)
Potassium: 4.2 mmol/L (ref 3.5–5.1)
Sodium: 132 mmol/L — ABNORMAL LOW (ref 135–145)
Total Bilirubin: 1.1 mg/dL (ref 0.0–1.2)
Total Protein: 6.5 g/dL (ref 6.5–8.1)

## 2023-07-01 MED ORDER — KETOROLAC TROMETHAMINE 15 MG/ML IJ SOLN
30.0000 mg | Freq: Once | INTRAMUSCULAR | Status: AC
  Administered 2023-07-01: 30 mg via INTRAVENOUS
  Filled 2023-07-01: qty 2

## 2023-07-01 MED ORDER — HYDROMORPHONE HCL 1 MG/ML IJ SOLN
2.0000 mg | Freq: Once | INTRAMUSCULAR | Status: AC
  Administered 2023-07-01: 2 mg via INTRAVENOUS
  Filled 2023-07-01: qty 2

## 2023-07-01 MED ORDER — SODIUM CHLORIDE 0.9 % IV SOLN: INTRAVENOUS | Status: DC

## 2023-07-02 ENCOUNTER — Emergency Department (HOSPITAL_COMMUNITY)

## 2023-07-02 ENCOUNTER — Other Ambulatory Visit: Payer: Self-pay | Admitting: Hematology & Oncology

## 2023-07-02 ENCOUNTER — Inpatient Hospital Stay (HOSPITAL_COMMUNITY)
Admission: EM | Admit: 2023-07-02 | Discharge: 2023-07-20 | DRG: 981 | Disposition: A | Discharge status: Rehabilitation Facility | Attending: Internal Medicine | Admitting: Internal Medicine

## 2023-07-02 ENCOUNTER — Other Ambulatory Visit: Payer: Self-pay

## 2023-07-02 DIAGNOSIS — C3490 Malignant neoplasm of unspecified part of unspecified bronchus or lung: Secondary | ICD-10-CM

## 2023-07-02 DIAGNOSIS — J440 Chronic obstructive pulmonary disease with acute lower respiratory infection: Secondary | ICD-10-CM | POA: Diagnosis present

## 2023-07-02 DIAGNOSIS — S22080S Wedge compression fracture of T11-T12 vertebra, sequela: Secondary | ICD-10-CM | POA: Diagnosis not present

## 2023-07-02 DIAGNOSIS — E66811 Obesity, class 1: Secondary | ICD-10-CM | POA: Diagnosis present

## 2023-07-02 DIAGNOSIS — Y95 Nosocomial condition: Secondary | ICD-10-CM | POA: Diagnosis present

## 2023-07-02 DIAGNOSIS — C779 Secondary and unspecified malignant neoplasm of lymph node, unspecified: Secondary | ICD-10-CM | POA: Diagnosis present

## 2023-07-02 DIAGNOSIS — M549 Dorsalgia, unspecified: Secondary | ICD-10-CM | POA: Diagnosis not present

## 2023-07-02 DIAGNOSIS — Z515 Encounter for palliative care: Secondary | ICD-10-CM | POA: Diagnosis not present

## 2023-07-02 DIAGNOSIS — Z79899 Other long term (current) drug therapy: Secondary | ICD-10-CM | POA: Diagnosis not present

## 2023-07-02 DIAGNOSIS — I7143 Infrarenal abdominal aortic aneurysm, without rupture: Secondary | ICD-10-CM | POA: Diagnosis present

## 2023-07-02 DIAGNOSIS — E222 Syndrome of inappropriate secretion of antidiuretic hormone: Secondary | ICD-10-CM | POA: Diagnosis present

## 2023-07-02 DIAGNOSIS — Z6832 Body mass index (BMI) 32.0-32.9, adult: Secondary | ICD-10-CM

## 2023-07-02 DIAGNOSIS — K5903 Drug induced constipation: Secondary | ICD-10-CM

## 2023-07-02 DIAGNOSIS — M069 Rheumatoid arthritis, unspecified: Secondary | ICD-10-CM | POA: Diagnosis present

## 2023-07-02 DIAGNOSIS — Z751 Person awaiting admission to adequate facility elsewhere: Secondary | ICD-10-CM

## 2023-07-02 DIAGNOSIS — Z87891 Personal history of nicotine dependence: Secondary | ICD-10-CM

## 2023-07-02 DIAGNOSIS — K219 Gastro-esophageal reflux disease without esophagitis: Secondary | ICD-10-CM | POA: Diagnosis present

## 2023-07-02 DIAGNOSIS — M8008XA Age-related osteoporosis with current pathological fracture, vertebra(e), initial encounter for fracture: Secondary | ICD-10-CM | POA: Diagnosis present

## 2023-07-02 DIAGNOSIS — E785 Hyperlipidemia, unspecified: Secondary | ICD-10-CM | POA: Diagnosis present

## 2023-07-02 DIAGNOSIS — E876 Hypokalemia: Secondary | ICD-10-CM | POA: Diagnosis present

## 2023-07-02 DIAGNOSIS — R0902 Hypoxemia: Principal | ICD-10-CM

## 2023-07-02 DIAGNOSIS — J9621 Acute and chronic respiratory failure with hypoxia: Secondary | ICD-10-CM | POA: Diagnosis present

## 2023-07-02 DIAGNOSIS — Z1152 Encounter for screening for COVID-19: Secondary | ICD-10-CM

## 2023-07-02 DIAGNOSIS — Z9981 Dependence on supplemental oxygen: Secondary | ICD-10-CM

## 2023-07-02 DIAGNOSIS — J8410 Pulmonary fibrosis, unspecified: Secondary | ICD-10-CM | POA: Diagnosis present

## 2023-07-02 DIAGNOSIS — C3411 Malignant neoplasm of upper lobe, right bronchus or lung: Secondary | ICD-10-CM | POA: Diagnosis present

## 2023-07-02 DIAGNOSIS — E861 Hypovolemia: Secondary | ICD-10-CM | POA: Diagnosis present

## 2023-07-02 DIAGNOSIS — E871 Hypo-osmolality and hyponatremia: Secondary | ICD-10-CM | POA: Diagnosis present

## 2023-07-02 DIAGNOSIS — C7971 Secondary malignant neoplasm of right adrenal gland: Secondary | ICD-10-CM | POA: Diagnosis present

## 2023-07-02 DIAGNOSIS — D709 Neutropenia, unspecified: Secondary | ICD-10-CM | POA: Diagnosis present

## 2023-07-02 DIAGNOSIS — G893 Neoplasm related pain (acute) (chronic): Secondary | ICD-10-CM

## 2023-07-02 DIAGNOSIS — C7970 Secondary malignant neoplasm of unspecified adrenal gland: Secondary | ICD-10-CM | POA: Diagnosis not present

## 2023-07-02 DIAGNOSIS — Z8249 Family history of ischemic heart disease and other diseases of the circulatory system: Secondary | ICD-10-CM

## 2023-07-02 DIAGNOSIS — M5416 Radiculopathy, lumbar region: Secondary | ICD-10-CM | POA: Diagnosis not present

## 2023-07-02 DIAGNOSIS — Z7189 Other specified counseling: Secondary | ICD-10-CM

## 2023-07-02 DIAGNOSIS — Z9889 Other specified postprocedural states: Secondary | ICD-10-CM

## 2023-07-02 DIAGNOSIS — I480 Paroxysmal atrial fibrillation: Secondary | ICD-10-CM | POA: Diagnosis present

## 2023-07-02 DIAGNOSIS — Z6833 Body mass index (BMI) 33.0-33.9, adult: Secondary | ICD-10-CM

## 2023-07-02 DIAGNOSIS — M5116 Intervertebral disc disorders with radiculopathy, lumbar region: Secondary | ICD-10-CM | POA: Diagnosis not present

## 2023-07-02 DIAGNOSIS — J189 Pneumonia, unspecified organism: Principal | ICD-10-CM | POA: Diagnosis present

## 2023-07-02 DIAGNOSIS — Z9049 Acquired absence of other specified parts of digestive tract: Secondary | ICD-10-CM

## 2023-07-02 DIAGNOSIS — Z8616 Personal history of COVID-19: Secondary | ICD-10-CM

## 2023-07-02 DIAGNOSIS — Z7952 Long term (current) use of systemic steroids: Secondary | ICD-10-CM

## 2023-07-02 DIAGNOSIS — Z923 Personal history of irradiation: Secondary | ICD-10-CM

## 2023-07-02 DIAGNOSIS — R4589 Other symptoms and signs involving emotional state: Secondary | ICD-10-CM

## 2023-07-02 DIAGNOSIS — C3491 Malignant neoplasm of unspecified part of right bronchus or lung: Secondary | ICD-10-CM | POA: Diagnosis not present

## 2023-07-02 DIAGNOSIS — R52 Pain, unspecified: Secondary | ICD-10-CM | POA: Diagnosis present

## 2023-07-02 DIAGNOSIS — Z7901 Long term (current) use of anticoagulants: Secondary | ICD-10-CM

## 2023-07-02 DIAGNOSIS — F419 Anxiety disorder, unspecified: Secondary | ICD-10-CM | POA: Diagnosis present

## 2023-07-02 DIAGNOSIS — Z888 Allergy status to other drugs, medicaments and biological substances status: Secondary | ICD-10-CM

## 2023-07-02 DIAGNOSIS — Z7951 Long term (current) use of inhaled steroids: Secondary | ICD-10-CM

## 2023-07-02 DIAGNOSIS — D61818 Other pancytopenia: Secondary | ICD-10-CM

## 2023-07-02 DIAGNOSIS — Z885 Allergy status to narcotic agent status: Secondary | ICD-10-CM

## 2023-07-02 LAB — CBC WITH DIFFERENTIAL/PLATELET
Abs Immature Granulocytes: 0 10*3/uL (ref 0.00–0.07)
Basophils Absolute: 0 10*3/uL (ref 0.0–0.1)
Basophils Relative: 0 %
Eosinophils Absolute: 0.1 10*3/uL (ref 0.0–0.5)
Eosinophils Relative: 5 %
HCT: 33.3 % — ABNORMAL LOW (ref 39.0–52.0)
Hemoglobin: 11.3 g/dL — ABNORMAL LOW (ref 13.0–17.0)
Immature Granulocytes: 0 %
Lymphocytes Relative: 33 %
Lymphs Abs: 0.4 10*3/uL — ABNORMAL LOW (ref 0.7–4.0)
MCH: 30.3 pg (ref 26.0–34.0)
MCHC: 33.9 g/dL (ref 30.0–36.0)
MCV: 89.3 fL (ref 80.0–100.0)
Monocytes Absolute: 0.2 10*3/uL (ref 0.1–1.0)
Monocytes Relative: 16 %
Neutro Abs: 0.6 10*3/uL — ABNORMAL LOW (ref 1.7–7.7)
Neutrophils Relative %: 46 %
Platelets: 175 10*3/uL (ref 150–400)
RBC: 3.73 MIL/uL — ABNORMAL LOW (ref 4.22–5.81)
RDW: 17.2 % — ABNORMAL HIGH (ref 11.5–15.5)
WBC: 1.3 10*3/uL — CL (ref 4.0–10.5)
nRBC: 0 % (ref 0.0–0.2)

## 2023-07-02 LAB — MAGNESIUM: Magnesium: 1.8 mg/dL (ref 1.7–2.4)

## 2023-07-02 LAB — TSH: TSH: 1.306 u[IU]/mL (ref 0.350–4.500)

## 2023-07-02 LAB — PHOSPHORUS: Phosphorus: 2.4 mg/dL — ABNORMAL LOW (ref 2.5–4.6)

## 2023-07-02 LAB — COMPREHENSIVE METABOLIC PANEL WITH GFR
ALT: 50 U/L — ABNORMAL HIGH (ref 0–44)
AST: 28 U/L (ref 15–41)
Albumin: 3.3 g/dL — ABNORMAL LOW (ref 3.5–5.0)
Alkaline Phosphatase: 71 U/L (ref 38–126)
Anion gap: 9 (ref 5–15)
BUN: 22 mg/dL (ref 8–23)
CO2: 24 mmol/L (ref 22–32)
Calcium: 8.4 mg/dL — ABNORMAL LOW (ref 8.9–10.3)
Chloride: 91 mmol/L — ABNORMAL LOW (ref 98–111)
Creatinine, Ser: 0.87 mg/dL (ref 0.61–1.24)
GFR, Estimated: >60 mL/min (ref >60)
Glucose, Bld: 103 mg/dL — ABNORMAL HIGH (ref 70–99)
Potassium: 3.3 mmol/L — ABNORMAL LOW (ref 3.5–5.1)
Sodium: 124 mmol/L — ABNORMAL LOW (ref 135–145)
Total Bilirubin: 1.6 mg/dL — ABNORMAL HIGH (ref 0.0–1.2)
Total Protein: 5.8 g/dL — ABNORMAL LOW (ref 6.5–8.1)

## 2023-07-02 LAB — PROCALCITONIN: Procalcitonin: 0.11 ng/mL

## 2023-07-02 MED ORDER — HYDROMORPHONE HCL 1 MG/ML IJ SOLN
1.0000 mg | Freq: Once | INTRAMUSCULAR | Status: AC
  Administered 2023-07-02: 1 mg via INTRAVENOUS
  Filled 2023-07-02: qty 1

## 2023-07-02 MED ORDER — VANCOMYCIN HCL 2000 MG/400ML IV SOLN
2000.0000 mg | Freq: Once | INTRAVENOUS | Status: AC
  Administered 2023-07-02: 2000 mg via INTRAVENOUS
  Filled 2023-07-02: qty 400

## 2023-07-02 MED ORDER — OXYCODONE-ACETAMINOPHEN 5-325 MG PO TABS
1.0000 | ORAL_TABLET | Freq: Four times a day (QID) | ORAL | Status: DC | PRN
  Administered 2023-07-02 – 2023-07-03 (×2): 2 via ORAL
  Filled 2023-07-02 (×2): qty 2

## 2023-07-02 MED ORDER — VANCOMYCIN HCL IN DEXTROSE 1-5 GM/200ML-% IV SOLN: 1000.0000 mg | Freq: Two times a day (BID) | INTRAVENOUS | Status: DC

## 2023-07-02 MED ORDER — METHOCARBAMOL 1000 MG/10ML IJ SOLN
1000.0000 mg | Freq: Once | INTRAMUSCULAR | Status: AC
  Administered 2023-07-02: 1000 mg via INTRAVENOUS
  Filled 2023-07-02: qty 10

## 2023-07-02 MED ORDER — ONDANSETRON HCL 4 MG/2ML IJ SOLN: 4.0000 mg | Freq: Four times a day (QID) | INTRAMUSCULAR | Status: DC | PRN

## 2023-07-02 MED ORDER — POTASSIUM PHOSPHATES 15 MMOLE/5ML IV SOLN
30.0000 mmol | Freq: Once | INTRAVENOUS | Status: AC
  Administered 2023-07-02: 30 mmol via INTRAVENOUS
  Filled 2023-07-02: qty 10

## 2023-07-02 MED ORDER — SODIUM CHLORIDE 0.9 % IV SOLN
2.0000 g | Freq: Once | INTRAVENOUS | Status: AC
  Administered 2023-07-02: 2 g via INTRAVENOUS
  Filled 2023-07-02: qty 12.5

## 2023-07-02 MED ORDER — METOPROLOL SUCCINATE ER 25 MG PO TB24
25.0000 mg | ORAL_TABLET | Freq: Every day | ORAL | Status: DC
  Administered 2023-07-03 – 2023-07-20 (×16): 25 mg via ORAL
  Filled 2023-07-02 (×16): qty 1

## 2023-07-02 MED ORDER — HYDROMORPHONE HCL 1 MG/ML IJ SOLN
1.0000 mg | INTRAMUSCULAR | Status: DC | PRN
  Administered 2023-07-02 – 2023-07-03 (×4): 1 mg via INTRAVENOUS
  Filled 2023-07-02 (×4): qty 1

## 2023-07-02 MED ORDER — KETOROLAC TROMETHAMINE 30 MG/ML IJ SOLN
15.0000 mg | Freq: Once | INTRAMUSCULAR | Status: AC
  Administered 2023-07-02: 15 mg via INTRAVENOUS
  Filled 2023-07-02: qty 1

## 2023-07-02 MED ORDER — SENNOSIDES-DOCUSATE SODIUM 8.6-50 MG PO TABS
1.0000 | ORAL_TABLET | Freq: Every evening | ORAL | Status: DC | PRN
  Filled 2023-07-02: qty 1

## 2023-07-02 MED ORDER — BUDESON-GLYCOPYRROL-FORMOTEROL 160-9-4.8 MCG/ACT IN AERO
2.0000 | INHALATION_SPRAY | Freq: Two times a day (BID) | RESPIRATORY_TRACT | Status: DC
  Administered 2023-07-02 – 2023-07-20 (×36): 2 via RESPIRATORY_TRACT
  Filled 2023-07-02 (×2): qty 5.9

## 2023-07-02 MED ORDER — ONDANSETRON HCL 4 MG PO TABS
4.0000 mg | ORAL_TABLET | Freq: Four times a day (QID) | ORAL | Status: DC | PRN
Start: 2023-07-02 — End: 2023-07-20

## 2023-07-02 MED ORDER — PREDNISONE 5 MG PO TABS
5.0000 mg | ORAL_TABLET | Freq: Every day | ORAL | Status: DC
  Administered 2023-07-03 – 2023-07-13 (×11): 5 mg via ORAL
  Filled 2023-07-02 (×12): qty 1

## 2023-07-02 MED ORDER — AMIODARONE HCL 200 MG PO TABS
200.0000 mg | ORAL_TABLET | Freq: Every day | ORAL | Status: DC
  Administered 2023-07-03 – 2023-07-20 (×18): 200 mg via ORAL
  Filled 2023-07-02 (×18): qty 1

## 2023-07-02 MED ORDER — POTASSIUM CHLORIDE CRYS ER 20 MEQ PO TBCR
40.0000 meq | EXTENDED_RELEASE_TABLET | Freq: Once | ORAL | Status: AC
  Administered 2023-07-02: 40 meq via ORAL
  Filled 2023-07-02 (×2): qty 2

## 2023-07-02 MED ORDER — APIXABAN 5 MG PO TABS
5.0000 mg | ORAL_TABLET | Freq: Two times a day (BID) | ORAL | Status: DC
  Administered 2023-07-02 – 2023-07-04 (×5): 5 mg via ORAL
  Filled 2023-07-02 (×5): qty 1

## 2023-07-02 MED ORDER — KETAMINE HCL 50 MG/5ML IJ SOSY
0.3000 mg/kg | PREFILLED_SYRINGE | Freq: Once | INTRAMUSCULAR | Status: AC
  Administered 2023-07-02: 32 mg via INTRAVENOUS
  Filled 2023-07-02: qty 5

## 2023-07-02 MED ORDER — SODIUM CHLORIDE 0.9 % IV BOLUS
500.0000 mL | Freq: Once | INTRAVENOUS | Status: AC
  Administered 2023-07-02: 500 mL via INTRAVENOUS

## 2023-07-02 MED ORDER — LIDOCAINE 5 % EX PTCH
1.0000 | MEDICATED_PATCH | CUTANEOUS | Status: DC
  Administered 2023-07-02 – 2023-07-19 (×17): 1 via TRANSDERMAL
  Filled 2023-07-02 (×17): qty 1

## 2023-07-02 MED ORDER — SODIUM CHLORIDE 0.9% FLUSH: 10.0000 mL | INTRAVENOUS | Status: DC | PRN

## 2023-07-02 MED ORDER — LEVALBUTEROL HCL 0.63 MG/3ML IN NEBU: 0.6300 mg | INHALATION_SOLUTION | Freq: Three times a day (TID) | RESPIRATORY_TRACT | Status: DC | PRN

## 2023-07-02 MED ORDER — OXYCODONE-ACETAMINOPHEN 5-325 MG PO TABS
1.0000 | ORAL_TABLET | Freq: Four times a day (QID) | ORAL | 0 refills | Status: DC | PRN
Start: 2023-07-02 — End: 2023-07-20

## 2023-07-02 MED ORDER — PAROXETINE HCL 10 MG PO TABS
10.0000 mg | ORAL_TABLET | Freq: Every day | ORAL | Status: DC
  Administered 2023-07-02 – 2023-07-19 (×18): 10 mg via ORAL
  Filled 2023-07-02 (×19): qty 1

## 2023-07-02 MED ORDER — ACETAMINOPHEN 325 MG PO TABS
650.0000 mg | ORAL_TABLET | Freq: Four times a day (QID) | ORAL | Status: DC | PRN
  Filled 2023-07-02: qty 2

## 2023-07-02 MED ORDER — SODIUM CHLORIDE 0.9 % IV SOLN
2.0000 g | Freq: Three times a day (TID) | INTRAVENOUS | Status: DC
  Administered 2023-07-03 – 2023-07-06 (×11): 2 g via INTRAVENOUS
  Filled 2023-07-02 (×11): qty 12.5

## 2023-07-02 MED ORDER — BENZONATATE 100 MG PO CAPS
200.0000 mg | ORAL_CAPSULE | Freq: Three times a day (TID) | ORAL | Status: DC | PRN
  Filled 2023-07-02: qty 2

## 2023-07-02 MED ORDER — ACETAMINOPHEN 650 MG RE SUPP: 650.0000 mg | Freq: Four times a day (QID) | RECTAL | Status: DC | PRN

## 2023-07-02 MED ORDER — IOHEXOL 350 MG/ML SOLN
75.0000 mL | Freq: Once | INTRAVENOUS | Status: AC | PRN
  Administered 2023-07-02: 75 mL via INTRAVENOUS

## 2023-07-02 MED ORDER — VANCOMYCIN HCL IN DEXTROSE 1-5 GM/200ML-% IV SOLN
1000.0000 mg | Freq: Once | INTRAVENOUS | Status: DC
  Filled 2023-07-02: qty 200

## 2023-07-02 MED ORDER — ENSURE ENLIVE PO LIQD
237.0000 mL | Freq: Three times a day (TID) | ORAL | Status: DC
  Administered 2023-07-02 – 2023-07-18 (×21): 237 mL via ORAL
  Filled 2023-07-02: qty 237

## 2023-07-02 NOTE — ED Provider Notes (Signed)
 [place] EMERGENCY DEPARTMENT AT [facility] Provider Note   CSN: [id number] Arrival date & time: 07/02/23  1307     History  Chief Complaint  Patient presents with   Back Pain   Leg Pain    John [name] is a 63 y.o. male.  HPI With metastatic squamous cell carcinoma, right lung, ongoing difficulty with pain management presents with worsening pain diffusely across the back, and it roughly typical distribution, characteristically same, but more severe than prior.  Patient also history of COPD, rheumatoid arthritis.  He has been taking oxycodone  at home without any pain relief.  He has seen his oncologist including yesterday, has been in the ED several times recently for pain control, has been unable to receive recent chemotherapy secondary to severe pain.  He is accompanied by his wife who assists with the history.    Home Medications Prior to Admission medications   Medication Sig Start Date End Date Taking? Authorizing Provider  acetaminophen  (TYLENOL ) 500 MG tablet Take 500 mg by mouth daily as needed for moderate pain (pain score 4-6), fever or headache.    [provider]  amiodarone  (PACERONE ) 200 MG tablet START by taking 2 tablets (400mg ) daily for 2 weeks THEN reduce to 1 tablet (200mg ) daily Patient taking differently: Take 200 mg by mouth daily. 04/02/23   Ursuy, Renee Lynn, PA-C  benzonatate  (TESSALON ) 200 MG capsule Take 1 capsule (200 mg total) by mouth 3 (three) times daily as needed. 06/02/23 06/01/24  Parrett, Macdonald Savoy, NP  ELIQUIS  5 MG TABS tablet TAKE 1 TABLET(5 MG) BY MOUTH TWICE DAILY Patient taking differently: Take 5 mg by mouth 2 (two) times daily. 05/12/23   Jacqueline Matsu, MD  Evolocumab  (REPATHA  SURECLICK) 140 MG/ML SOAJ ADMINISTER 1 ML UNDER THE SKIN EVERY 14 DAYS 05/16/23   Jacqueline Matsu, MD  HYDROcodone  bit-homatropine (HYDROMET) 5-1.5 MG/5ML syrup Take 5 mLs by mouth every 6 (six) hours as needed for cough. 06/28/23   Parrett, Tammy S,  NP  lidocaine  (LIDODERM ) 5 % Place 1 patch onto the skin daily. Remove & Discard patch within 12 hours or as directed by MD 04/29/23   Ivor Mars, MD  metoprolol  succinate (TOPROL -XL) 25 MG 24 hr tablet TAKE 1 TABLET(25 MG) BY MOUTH DAILY 05/18/23   Jacqueline Matsu, MD  ondansetron  (ZOFRAN ) 8 MG tablet Take 1 tablet (8 mg total) by mouth every 8 (eight) hours as needed for nausea or vomiting. 05/26/23   Ivor Mars, MD  oxyCODONE -acetaminophen  (PERCOCET/ROXICET) 5-325 MG tablet Take 1-2 tablets by mouth every 6 (six) hours as needed. 07/02/23   Ivor Mars, MD  PARoxetine  (PAXIL ) 10 MG tablet Take 1 tablet (10 mg total) by mouth at bedtime. 01/11/23   Dorthy Gavia, MD  polyethylene glycol (MIRALAX  / GLYCOLAX ) 17 g packet Take 17 g by mouth daily as needed for mild constipation. 06/09/23   Lonita Roach, MD  predniSONE  (DELTASONE ) 5 MG tablet Take 1 tablet (5 mg total) by mouth daily with breakfast. 08/18/22   Ivor Mars, MD  prochlorperazine  (COMPAZINE ) 10 MG tablet Take 1 tablet (10 mg total) by mouth every 6 (six) hours as needed for nausea or vomiting. 05/26/23   Ivor Mars, MD  TRELEGY ELLIPTA  100-62.5-25 MCG/ACT AEPB INHALE 1 PUFF INTO THE LUNGS DAILY 06/20/23   Denson Flake, MD      Allergies    Ventolin  [albuterol ], Lipitor [atorvastatin ], and Oxycodone     Review of Systems  Review of Systems  Physical Exam Updated Vital Signs BP 121/79   Pulse 92   Temp 98.1 F (36.7 C)   Resp 19   SpO2 91%  Physical Exam Vitals and nursing note reviewed.  Constitutional:      Appearance: He is well-developed. He is ill-appearing and diaphoretic.  HENT:     Head: Normocephalic and atraumatic.  Eyes:     Conjunctiva/sclera: Conjunctivae normal.  Cardiovascular:     Rate and Rhythm: Regular rhythm. Tachycardia present.  Pulmonary:     Effort: Pulmonary effort is normal. No respiratory distress.     Breath sounds: No stridor.  Abdominal:     General: There is  no distension.  Skin:    General: Skin is warm.  Neurological:     Mental Status: He is alert and oriented to person, place, and time.     ED Results / Procedures / Treatments   Labs (all labs ordered are listed, but only abnormal results are displayed) Labs Reviewed - No data to display  EKG None  Radiology No results found.  Procedures Procedures    Medications Ordered in ED Medications  sodium chloride  0.9 % bolus 500 mL (has no administration in time range)  HYDROmorphone  (DILAUDID ) injection 1 mg (has no administration in time range)  ketorolac  (TORADOL ) 30 MG/ML injection 15 mg (has no administration in time range)    ED Course/ Medical Decision Making/ A&P                                 Medical Decision Making -year-old male with metastatic cancer chronic pain complicated by COPD, rheumatoid arthritis presents with worsening pain.  Patient awake and alert, in obvious discomfort on arrival, hypoxic, 85% possibly secondary to splinting due to thoracic pain.  Patient had Dilaudid  labs x-ray ordered, chart review commenced.  With supplemental oxygen patient on 6 L has saturation 94%.  Amount and/or Complexity of Data Reviewed Independent Historian: spouse External Data Reviewed: notes. Labs: ordered. Decision-making details documented in ED Course. Radiology: ordered and independent interpretation performed. Decision-making details documented in ED Course.  Risk Decision regarding hospitalization. Diagnosis or treatment significantly limited by social determinants of health.  Update: Patient continues to complain of pain, has increased oxygen requirement, now 4 L.  He is only on oxygen as needed at home.  He is afebrile. Update:, Labs notable for leukopenia, hyponatremia, both new.  Update:, At bedside I reviewed the patient's CT imaging with his wife, with him.  We reviewed his multiple spinal compression fractures, and CT with multiple abnormalities in his  thoracic cavity, inflammatory versus infectious, no pulmonary embolism. With some suspicion for infectious etiology given his leukopenia, increased oxygen requirement patient will start antibiotics, though hypoxia may be also secondary to pain, splinting. Given his new oxygen requirement, patient will be admitted for further monitoring, management.  I talked with the patient's wife and him about palliative care consult and they are amenable to this as well given his metastatic cancer, new oxygen requirement, new pain medication requirements, and new electrolyte abnormalities.    Final Clinical Impression(s) / ED Diagnoses Final diagnoses:  Hypoxia  Pain     Dorenda Gandy, MD 07/02/23 [phone]

## 2023-07-02 NOTE — ED Notes (Signed)
 ED Provider at bedside.

## 2023-07-02 NOTE — Progress Notes (Signed)
 Pharmacy Antibiotic Note  John [name] is a 63 y.o. male admitted on 07/02/2023 with metastatic squamous cell carcinoma, right lung, ongoing difficulty with pain management presents with worsening pain diffusely across the back, .  Pharmacy has been consulted to dose vanc for pna.  Plan: Vancomycin  1gm IV q12h (AUC 449.2, Scr 0.87) Follow renal function,cultures and clinical course    Temp (24hrs), Avg:97.8 F (36.6 C), Min:97.5 F (36.4 C), Max:98.1 F (36.7 C)  Recent Labs  Lab 07/01/23 0814 07/02/23 1423  WBC 4.3 1.3*  CREATININE 1.04 0.87    Estimated Creatinine Clearance: 111 mL/min (by C-G formula based on SCr of 0.87 mg/dL).    Allergies  Allergen Reactions   Ventolin  [Albuterol ] Other (See Comments)    Patient has had episode of atrial fib following administration of albuterol  (tolerates Xopenex  well)   Lipitor [Atorvastatin ] Other (See Comments)    Arthralgias    Oxycodone  Other (See Comments)    Severe Migranes    Antimicrobials this admission: 5/17 vanc >> 5/17 cefepime  >>  Dose adjustments this admission:   Microbiology results: 5/17 BCx:   5/17 MRSA PCR:   Thank you for allowing pharmacy to be a part of this patient's care.  Beau Bound RPh 07/02/2023, 9:47 PM

## 2023-07-02 NOTE — ED Notes (Signed)
 Patient given ketamine, he doesn't like affects of medication.

## 2023-07-02 NOTE — H&P (Addendum)
 History and Physical  John [name] ZHY:[id number] DOB: [birth year] DOA: 07/02/2023  PCP: Patient, No Pcp Per   Chief Complaint: Low back pain and shortness of breath  HPI: John [name] is a 63 y.o. 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  Patient reports she has had back pain for months but the pain was too severe today to tolerate.  His home Percocet is not controlling his pain.  He describes the pain as sharp in his low back, radiating to both legs. He also reports difficulty breathing today.  On EMS arrival, patient was found to have SpO2 of 88% which improved with 6 L [place]. He reports difficulty walking due to the pain, poor appetite and feeling thirsty but denies any fevers, chills, cough, nausea, vomiting, abdominal pain, headache or dizziness.  ED Course: Initial vitals show temp 98.1, RR 19, HR 90, BP 161/101 and SpO2 95% on 6 L [place].  Labs significant for sodium 124, K+ 3.3, creatinine 0.87, WBC 1.3 (from 4.3 1-day ago), Hgb 11.3, platelet 175, mag 1.8, Phos 2.4. CXR shows right basilar consolidation concerning for acute airway disease versus atelectasis. CTA PE study negative for PE but shows signs of possible multifocal pneumonia. Patient received IV morphine  1 mg x 2, IV ketamine 32 mg x 1, Toradol  15 mg x 1, IV NS 500 cc x 1, IV cefepime  and IV vancomycin . TR[facility] was consulted for admission.  Review of Systems: Please see HPI for pertinent positives and negatives. A complete 10 system review of systems are otherwise negative.  Past Medical History:  Diagnosis Date   Arthritis    Rheumatoid   Bacteremia due  to Pseudomonas    C. difficile colitis    Chronic anxiety 11/08/2014   Cough 09/22/2021   COVID 2020   mild case   Depression    Drug-induced neutropenia (HCC) 08/11/2020   Family history of adverse reaction to anesthesia    brother with seizures had episode under anesthesia.  Patient has seizures as well.   GERD (gastroesophageal reflux disease)    History of radiation therapy 04/24/2020-05/16/2020   IMRT to right lung     Dr Retta Caster   History of radiation therapy    08/10/21-08/19/21-Dr. Retta Caster   History of radiation therapy    Abdomen- 03/21/23-03/31/23-Dr. Retta Caster   Neutropenia [facility]) 05/12/2020   Neutropenic fever (HCC) 05/12/2020   Normocytic anemia 04/18/2021   PAF (paroxysmal atrial fibrillation) (HCC)    CHADS2VSAC score 3   Rheumatoid aortitis    Secondary hypercoagulable state (HCC) 10/28/2020   Sepsis (HCC) 10/06/2020   Shortness of breath 12/18/2020   Squamous cell lung cancer [facility])    Past Surgical History:  Procedure Laterality Date   BRONCHIAL BRUSHINGS  03/27/2020   Procedure: BRONCHIAL BRUSHINGS;  Surgeon: Denson Flake, MD;  Location: [facility] ENDOSCOPY;  Service: Cardiopulmonary;;   BUBBLE STUDY  10/13/2020   Procedure: BUBBLE STUDY;  Surgeon: Hazle Lites, MD;  Location: [facility] ENDOSCOPY;  Service: Cardiovascular;;   CARDIOVERSION N/A 10/13/2020   Procedure: CARDIOVERSION;  Surgeon: Hazle Lites, MD;  Location: [facility] ENDOSCOPY;  Service: Cardiovascular;  Laterality: N/A;   CATARACT EXTRACTION  2016  at [facility]   CHOLECYSTECTOMY N/A 06/08/2023   Procedure: LAPAROSCOPIC CHOLECYSTECTOMY;  Surgeon: Junie Olds, MD;  Location: [facility] ORS;  Service: General;  Laterality: N/A;   FINE NEEDLE ASPIRATION  03/27/2020   Procedure: FINE NEEDLE ASPIRATION;  Surgeon: Denson Flake, MD;  Location: [facility] ENDOSCOPY;  Service: Cardiopulmonary;;   HIP SURGERY Left    IR KYPHO EA ADDL LEVEL THORACIC OR LUMBAR  10/12/2021   IR KYPHO LUMBAR INC FX REDUCE BONE BX UNI/BIL  CANNULATION INC/IMAGING  10/12/2021   IR KYPHO THORACIC WITH BONE BIOPSY  09/07/2021   IR RADIOLOGIST EVAL & MGMT  09/03/2021   RADIOLOGY WITH ANESTHESIA N/A 10/12/2021   Procedure: L1 and L3 Kyphoplasty;  Surgeon: de Macedo Rodrigues, [place], MD;  Location: [facility] OR;  Service: Radiology;  Laterality: N/A;   TEE WITHOUT CARDIOVERSION N/A 10/13/2020   Procedure: TRANSESOPHAGEAL ECHOCARDIOGRAM (TEE);  Surgeon: Hazle Lites, MD;  Location: [facility] ENDOSCOPY;  Service: Cardiovascular;  Laterality: N/A;   VIDEO BRONCHOSCOPY WITH ENDOBRONCHIAL ULTRASOUND N/A 03/27/2020   Procedure: VIDEO BRONCHOSCOPY WITH ENDOBRONCHIAL ULTRASOUND;  Surgeon: Denson Flake, MD;  Location: [facility] ENDOSCOPY;  Service: Cardiopulmonary;  Laterality: N/A;   Social History:  reports that he quit smoking about 7 years ago. His smoking use included cigarettes. He started smoking about 37 years ago. He has a 30 pack-year smoking history. He has never used smokeless tobacco. He reports that he does not currently use alcohol. He reports that he does not use drugs.  Allergies  Allergen Reactions   Ventolin  [Albuterol ] Other (See Comments)    Patient has had episode of atrial fib following administration of albuterol  (tolerates Xopenex  well)   Lipitor [Atorvastatin ] Other (See Comments)    Arthralgias    Oxycodone  Other (See Comments)    Severe Migranes    Family History  Problem Relation Age of Onset   Arrhythmia Mother        has PPM   Heart disease Father        Died at 75, started in his 27s, heart attacks, had PPM and ICD     Prior to Admission medications   Medication Sig Start Date End Date Taking? Authorizing Provider  acetaminophen  (TYLENOL ) 500 MG tablet Take 500 mg by mouth daily as needed for moderate pain (pain score 4-6), fever or headache.    [provider]  amiodarone  (PACERONE ) 200 MG tablet START by taking 2 tablets (400mg ) daily for 2 weeks THEN reduce to 1 tablet (200mg ) daily Patient taking  differently: Take 200 mg by mouth daily. 04/02/23   Ursuy, Renee Lynn, PA-C  benzonatate  (TESSALON ) 200 MG capsule Take 1 capsule (200 mg total) by mouth 3 (three) times daily as needed. 06/02/23 06/01/24  Parrett, Macdonald Savoy, NP  ELIQUIS  5 MG TABS tablet TAKE 1 TABLET(5 MG) BY MOUTH TWICE DAILY Patient taking differently: Take 5 mg by mouth 2 (two) times daily. 05/12/23   Jacqueline Matsu, MD  Evolocumab  (REPATHA  SURECLICK) 140 MG/ML SOAJ ADMINISTER 1 ML UNDER THE SKIN EVERY 14 DAYS 05/16/23   Jacqueline Matsu, MD  HYDROcodone  bit-homatropine (HYDROMET) 5-1.5 MG/5ML syrup Take 5 mLs by mouth every 6 (six) hours as needed for cough. 06/28/23   Parrett, Macdonald Savoy, NP  lidocaine  (LIDODERM ) 5 % Place 1 patch onto the skin daily. Remove & Discard patch within 12 hours or as directed by MD 04/29/23   Ivor Mars, MD  metoprolol  succinate (TOPROL -XL) 25 MG 24 hr tablet TAKE 1 TABLET(25 MG) BY  MOUTH DAILY 05/18/23   Jacqueline Matsu, MD  ondansetron  (ZOFRAN ) 8 MG tablet Take 1 tablet (8 mg total) by mouth every 8 (eight) hours as needed for nausea or vomiting. 05/26/23   Ivor Mars, MD  oxyCODONE -acetaminophen  (PERCOCET/ROXICET) 5-325 MG tablet Take 1-2 tablets by mouth every 6 (six) hours as needed. 07/02/23   Ivor Mars, MD  PARoxetine  (PAXIL ) 10 MG tablet Take 1 tablet (10 mg total) by mouth at bedtime. 01/11/23   Dorthy Gavia, MD  polyethylene glycol (MIRALAX  / GLYCOLAX ) 17 g packet Take 17 g by mouth daily as needed for mild constipation. 06/09/23   Lonita Roach, MD  predniSONE  (DELTASONE ) 5 MG tablet Take 1 tablet (5 mg total) by mouth daily with breakfast. 08/18/22   Ivor Mars, MD  prochlorperazine  (COMPAZINE ) 10 MG tablet Take 1 tablet (10 mg total) by mouth every 6 (six) hours as needed for nausea or vomiting. 05/26/23   Ivor Mars, MD  TRELEGY ELLIPTA  100-62.5-25 MCG/ACT AEPB INHALE 1 PUFF INTO THE LUNGS DAILY 06/20/23   Denson Flake, MD    Physical Exam: BP (!) 101/58 (BP  Location: Right Arm)   Pulse 87   Temp (!) 97.5 F (36.4 C) (Oral)   Resp 16   SpO2 95%  General: Pleasant, ill-appearing obese man laying on side in bed. No acute distress. HEENT: [place]/AT. Anicteric sclera. Dry mucous membrane. CV: RRR. No murmurs, rubs, or gallops. No LE edema Pulmonary: On 6 L .  Lungs CTAB. Normal effort. No wheezing or rales. Abdominal: Soft, nontender, nondistended. Normal bowel sounds. Extremities: Palpable radial and DP pulses. Normal ROM. Skin: Warm and dry. No obvious rash or lesions. Neuro: A&Ox3. Moves all extremities. Normal sensation to light touch. No focal deficit. Psych: Normal mood and affect          Labs on Admission:  Basic Metabolic Panel: Recent Labs  Lab 07/01/23 0814 07/02/23 1423  NA 132* 124*  K 4.2 3.3*  CL 97* 91*  CO2 24 24  GLUCOSE 176* 103*  BUN 21 22  CREATININE 1.04 0.87  CALCIUM  8.9 8.4*   Liver Function Tests: Recent Labs  Lab 07/01/23 0814 07/02/23 1423  AST 42* 28  ALT 55* 50*  ALKPHOS 84 71  BILITOT 1.1 1.6*  PROT 6.5 5.8*  ALBUMIN  4.3 3.3*   No results for input(s): "LIPASE", "AMYLASE" in the last 168 hours. No results for input(s): "AMMONIA" in the last 168 hours. CBC: Recent Labs  Lab 07/01/23 0814 07/02/23 1423  WBC 4.3 1.3*  NEUTROABS 3.9 0.6*  HGB 12.6* 11.3*  HCT 36.8* 33.3*  MCV 88.0 89.3  PLT 259 175   Cardiac Enzymes: No results for input(s): "CKTOTAL", "CKMB", "CKMBINDEX", "TROPONINI" in the last 168 hours. BNP (last 3 results) No results for input(s): "BNP" in the last 8760 hours.  ProBNP (last 3 results) No results for input(s): "PROBNP" in the last 8760 hours.  CBG: No results for input(s): "GLUCAP" in the last 168 hours.  Radiological Exams on Admission: CT Angio Chest PE W and/or Wo Contrast Result Date: 07/02/2023 CLINICAL DATA:  Right-sided chest pain. EXAM: CT ANGIOGRAPHY CHEST WITH CONTRAST TECHNIQUE: Multidetector CT imaging of the chest was performed using the standard  protocol during bolus administration of intravenous contrast. Multiplanar CT image reconstructions and MIPs were obtained to evaluate the vascular anatomy. RADIATION DOSE REDUCTION: This exam was performed according to the departmental dose-optimization program which includes automated exposure control, adjustment of the mA and/or  kV according to patient size and/or use of iterative reconstruction technique. CONTRAST:  75mL OMNIPAQUE  IOHEXOL  350 MG/ML SOLN COMPARISON:  CT angiogram chest abdomen and pelvis 06/23/2023. FINDINGS: Cardiovascular: Satisfactory opacification of the pulmonary arteries to the segmental level. No evidence of pulmonary embolism. Normal heart size. No pericardial effusion. Mediastinum/Nodes: No enlarged mediastinal, hilar, or axillary lymph nodes. Thyroid  gland, trachea, and esophagus demonstrate no significant findings. Lungs/Pleura: Mild emphysema again noted in the lung apices. Multifocal areas of ground-glass opacity are again seen throughout both lungs along with some peripheral reticular opacities. Fibrotic changes are again noted in the right lung base. There is some focal airspace opacity in the right perihilar region and superior segment of the right lower lobe with some central cavitation. There is no pleural effusion or pneumothorax. Compared to 06/23/2023 there has been no significant interval change. Upper Abdomen: No acute abnormality. Cholecystectomy clips are present. Infrarenal abdominal aortic aneurysm measures up to 3.3 cm and appears unchanged. Musculoskeletal: No acute osseous findings. Review of the MIP images confirms the above findings. IMPRESSION: 1. No evidence for pulmonary embolism. 2. Stable multifocal areas of ground-glass opacity throughout both lungs along with some peripheral reticular opacities. Findings are favored as infectious/inflammatory. 3. Stable focal airspace opacity in the right perihilar region and superior segment of the right lower lobe with some  central cavitation. Findings may be infectious/inflammatory. 4. Stable 3.3 cm infrarenal abdominal aortic aneurysm. Recommend follow-up ultrasound every 3 years. Aortic Atherosclerosis (ICD10-I70.0) and Emphysema (ICD10-J43.9). Electronically Signed   By: Tyron Gallon M.D.   On: 07/02/2023 17:48   CT T-SPINE NO CHARGE Result Date: 07/02/2023 CLINICAL DATA:  Lower back and bilateral lower extremity pain, stage IV lung cancer EXAM: CT Thoracic Spine with contrast TECHNIQUE: Multiplanar CT images of the thoracic spine were reconstructed from contemporary CT of the Chest. RADIATION DOSE REDUCTION: This exam was performed according to the departmental dose-optimization program which includes automated exposure control, adjustment of the mA and/or kV according to patient size and/or use of iterative reconstruction technique. CONTRAST:  No additional COMPARISON:  06/23/2023 FINDINGS: Alignment: Alignment is grossly anatomic. Vertebrae: Stable compression deformities at T10, T11, T12, L1, and L2. Prior vertebral augmentations seen at T12 and L1. There are no acute displaced fractures. No destructive bony abnormalities. Paraspinal and other soft tissues: Paraspinal soft tissues are unremarkable. Continue consolidation cavitation within the right lower lobe. Please see separate CT chest report for findings in that region. Disc levels: There is no significant bony encroachment upon the central canal or neural foramina. Reconstructed images demonstrate no additional findings. IMPRESSION: 1. Chronic compression deformities from T10 through L2, with evidence of prior vertebral augmentation at T12 and L1. No acute thoracic spine fracture. 2. Please refer to separate CT chest report for findings in that region. Electronically Signed   By: Bobbye Burrow M.D.   On: 07/02/2023 17:47   DG Chest Port 1 View Result Date: 07/02/2023 CLINICAL DATA:  Lower back and bilateral lower extremity pain, stage IV lung cancer EXAM: PORTABLE  CHEST 1 VIEW COMPARISON:  06/23/2023 FINDINGS: Single frontal view of the chest demonstrates a stable cardiac silhouette. Fibrotic changes in the right lung are stable, consistent with post treatment appearance in this patient with history of lung cancer. There is progressive consolidation at the right lung base which may reflect new airspace disease or atelectasis. No effusion or pneumothorax. Left chest is clear. No acute bony abnormalities. IMPRESSION: 1. Increasing right basilar consolidation superimposed upon chronic post radiation fibrosis, which may  reflect acute airspace disease or atelectasis. Electronically Signed   By: Bobbye Burrow M.D.   On: 07/02/2023 14:48   Assessment/Plan John [name] is a 63 y.o. 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  # Acute on chronic respiratory failure with hypoxia # Pulmonary fibrosis # COPD - On 3 L [place] at baseline, found to be hypoxic requiring up to 15 L [place] - This is in the setting of pneumonia - No wheezing or cough to suggest COPD exacerbation - Continue supplemental O2, wean as able - Continue home prednisone  and inhaler - As needed nebs, flutter valve, IS  # HCAP # Neutropenia - Hx lung cancer with recent hospitalization for pneumonia less than 1 month ago - Found to be hypoxic and attributed with evidence of opacities on chest imaging - Neutropenic with WBC of 1.3, from 4.3 on 5/16 - Hospitalization for pneumonia within the last month, continue IV vancomycin  and cefepime  - Check MRSA screen,  procalcitonin, urinary Legionella and strep pneumo - Trend CBC, fever curve  # Intractable back pain # Stage IV lung cancer # Neutropenia - Undergoing active therapy with Gemzar  and Navelbine   - Has not had any chemotherapy over the last week due to significant pain and neutropenia - Multimodal pain control with lidocaine  patch, as needed Tylenol , Robaxin , Percocet and IV Dilaudid  - Palliative care consulted for goals of care conversation - Dr. Maria Shiner, oncologist, added to care team  # Hyponatremia - Sodium down from 132 days ago to 124 - Likely hypovolemic hyponatremia in the setting of recent poor p.o. 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 Inpatient status is judged to be reasonable and necessary in order to provide the required intensity of service to ensure the patient's safety. The patient's presenting symptoms, physical exam findings, and initial radiographic and laboratory data in the context of their chronic comorbidities is felt to place them at high risk for  further clinical deterioration. Furthermore, it is not anticipated that the patient will be medically stable for discharge from the hospital within 2 midnights of admission.   * I certify that at the point of admission it is my clinical judgment that the patient will require inpatient  hospital care spanning beyond 2 midnights from the point of admission due to high intensity of service, high risk for further deterioration and high frequency of surveillance required.*  Level of care: Telemetry   This record has been created using Conservation officer, historic buildings. Errors have been sought and corrected, but may not always be located. Such creation errors do not reflect on the standard of care.   Vita Grip, MD 07/02/2023, 8:20 PM [facility] Pager: [phone] Isaiah 41:10   If 7PM-7AM, please contact night-coverage [email or web address] Password [email or web address]

## 2023-07-02 NOTE — ED Triage Notes (Signed)
 BIBA from home for severe lower back pain and BLE pain. Pt states home meds are not helping.Pt has a hx of stage 4 lung CA. 88% nasal cannula @ 6 lpm  106/60 BP 95 HR

## 2023-07-03 ENCOUNTER — Encounter (HOSPITAL_COMMUNITY): Payer: Self-pay | Admitting: Student

## 2023-07-03 ENCOUNTER — Inpatient Hospital Stay (HOSPITAL_COMMUNITY)

## 2023-07-03 DIAGNOSIS — Z7189 Other specified counseling: Secondary | ICD-10-CM | POA: Diagnosis not present

## 2023-07-03 DIAGNOSIS — M549 Dorsalgia, unspecified: Secondary | ICD-10-CM

## 2023-07-03 DIAGNOSIS — Z515 Encounter for palliative care: Secondary | ICD-10-CM

## 2023-07-03 DIAGNOSIS — K5903 Drug induced constipation: Secondary | ICD-10-CM | POA: Diagnosis not present

## 2023-07-03 DIAGNOSIS — J9621 Acute and chronic respiratory failure with hypoxia: Secondary | ICD-10-CM | POA: Diagnosis not present

## 2023-07-03 LAB — RESPIRATORY PANEL BY PCR

## 2023-07-03 LAB — RESP PANEL BY RT-PCR (RSV, FLU A&B, COVID)  RVPGX2
Influenza A by PCR: NEGATIVE
Influenza B by PCR: NEGATIVE
Resp Syncytial Virus by PCR: NEGATIVE
SARS Coronavirus 2 by RT PCR: NEGATIVE

## 2023-07-03 LAB — SODIUM, URINE, RANDOM: Sodium, Ur: 129 mmol/L

## 2023-07-03 LAB — MRSA NEXT GEN BY PCR, NASAL: MRSA by PCR Next Gen: NOT DETECTED

## 2023-07-03 LAB — COMPREHENSIVE METABOLIC PANEL WITH GFR
ALT: 48 U/L — ABNORMAL HIGH (ref 0–44)
AST: 29 U/L (ref 15–41)
Albumin: 3.3 g/dL — ABNORMAL LOW (ref 3.5–5.0)
Alkaline Phosphatase: 67 U/L (ref 38–126)
Anion gap: 10 (ref 5–15)
BUN: 17 mg/dL (ref 8–23)
CO2: 22 mmol/L (ref 22–32)
Calcium: 7.8 mg/dL — ABNORMAL LOW (ref 8.9–10.3)
Chloride: 91 mmol/L — ABNORMAL LOW (ref 98–111)
Creatinine, Ser: 0.83 mg/dL (ref 0.61–1.24)
GFR, Estimated: >60 mL/min (ref >60)
Glucose, Bld: 121 mg/dL — ABNORMAL HIGH (ref 70–99)
Potassium: 3.8 mmol/L (ref 3.5–5.1)
Sodium: 123 mmol/L — ABNORMAL LOW (ref 135–145)
Total Bilirubin: 1.3 mg/dL — ABNORMAL HIGH (ref 0.0–1.2)
Total Protein: 5.5 g/dL — ABNORMAL LOW (ref 6.5–8.1)

## 2023-07-03 LAB — CBC
HCT: 32.8 % — ABNORMAL LOW (ref 39.0–52.0)
Hemoglobin: 11.4 g/dL — ABNORMAL LOW (ref 13.0–17.0)
MCH: 30.6 pg (ref 26.0–34.0)
MCHC: 34.8 g/dL (ref 30.0–36.0)
MCV: 87.9 fL (ref 80.0–100.0)
Platelets: 211 10*3/uL (ref 150–400)
RBC: 3.73 MIL/uL — ABNORMAL LOW (ref 4.22–5.81)
RDW: 17.8 % — ABNORMAL HIGH (ref 11.5–15.5)
WBC: 1.5 10*3/uL — ABNORMAL LOW (ref 4.0–10.5)
nRBC: 0 % (ref 0.0–0.2)

## 2023-07-03 LAB — T4, FREE: Free T4: 1.79 ng/dL — ABNORMAL HIGH (ref 0.61–1.12)

## 2023-07-03 LAB — OSMOLALITY, URINE: Osmolality, Ur: 575 mosm/kg (ref 300–900)

## 2023-07-03 LAB — PHOSPHORUS: Phosphorus: 4 mg/dL (ref 2.5–4.6)

## 2023-07-03 LAB — STREP PNEUMONIAE URINARY ANTIGEN: Strep Pneumo Urinary Antigen: NEGATIVE

## 2023-07-03 LAB — SODIUM: Sodium: 122 mmol/L — ABNORMAL LOW (ref 135–145)

## 2023-07-03 MED ORDER — HYDROMORPHONE HCL 1 MG/ML IJ SOLN
1.0000 mg | INTRAMUSCULAR | Status: DC | PRN
  Administered 2023-07-03 – 2023-07-11 (×21): 1 mg via INTRAVENOUS
  Filled 2023-07-03 (×21): qty 1

## 2023-07-03 MED ORDER — METHOCARBAMOL 1000 MG/10ML IJ SOLN
1000.0000 mg | Freq: Four times a day (QID) | INTRAMUSCULAR | Status: DC | PRN
  Administered 2023-07-03 – 2023-07-10 (×8): 1000 mg via INTRAVENOUS
  Filled 2023-07-03 (×11): qty 10

## 2023-07-03 MED ORDER — METHOCARBAMOL 1000 MG/10ML IJ SOLN
1000.0000 mg | Freq: Four times a day (QID) | INTRAMUSCULAR | Status: AC | PRN
  Administered 2023-07-03 (×2): 1000 mg via INTRAVENOUS
  Filled 2023-07-03 (×2): qty 10

## 2023-07-03 MED ORDER — GABAPENTIN 100 MG PO CAPS
100.0000 mg | ORAL_CAPSULE | Freq: Every day | ORAL | Status: DC
  Administered 2023-07-03 – 2023-07-09 (×7): 100 mg via ORAL
  Filled 2023-07-03 (×7): qty 1

## 2023-07-03 MED ORDER — FENTANYL 25 MCG/HR TD PT72
1.0000 | MEDICATED_PATCH | TRANSDERMAL | Status: DC
  Administered 2023-07-03: 1 via TRANSDERMAL
  Filled 2023-07-03: qty 1

## 2023-07-03 MED ORDER — POLYETHYLENE GLYCOL 3350 17 G PO PACK
17.0000 g | PACK | Freq: Every day | ORAL | Status: DC
  Administered 2023-07-03 – 2023-07-13 (×7): 17 g via ORAL
  Filled 2023-07-03 (×15): qty 1

## 2023-07-03 MED ORDER — HYDROMORPHONE HCL 2 MG PO TABS
2.0000 mg | ORAL_TABLET | Freq: Three times a day (TID) | ORAL | Status: AC
  Administered 2023-07-03 (×3): 2 mg via ORAL
  Filled 2023-07-03 (×2): qty 1

## 2023-07-03 MED ORDER — HYDROMORPHONE HCL 2 MG PO TABS
2.0000 mg | ORAL_TABLET | ORAL | Status: DC | PRN
  Administered 2023-07-04 – 2023-07-08 (×6): 2 mg via ORAL
  Filled 2023-07-03 (×10): qty 1

## 2023-07-03 MED ORDER — BISACODYL 10 MG RE SUPP
10.0000 mg | Freq: Every day | RECTAL | Status: DC | PRN
  Administered 2023-07-03 – 2023-07-06 (×2): 10 mg via RECTAL
  Filled 2023-07-03 (×2): qty 1

## 2023-07-03 MED ORDER — SENNOSIDES-DOCUSATE SODIUM 8.6-50 MG PO TABS
2.0000 | ORAL_TABLET | Freq: Two times a day (BID) | ORAL | Status: DC
  Administered 2023-07-03 – 2023-07-09 (×11): 2 via ORAL
  Filled 2023-07-03 (×14): qty 2

## 2023-07-03 NOTE — Evaluation (Signed)
 Occupational Therapy Evaluation Patient Details Name: John [name] MRN: [id number] DOB: 10-22-1960 Today's Date: 07/03/2023   History of Present Illness   Pt is 63 yo male who presented to the ED for evaluation of worsening back pain and shortness of breath. 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     Clinical Impressions Pt admitted with the above diagnoses and presents with below problem list. Pt will benefit from continued acute OT to address the below listed deficits and maximize independence with basic ADLs prior to d/c. PTA pt's spouse endorses recent increased assistance needed for basic ADLs and mobility. Pt with 8/10 FACES back and generalized pain at rest limiting OT eval. Pt was observed to be able to roll to each side with min A of radiology tech during attempt at back x-ray. Pt ultimately unable to tolerate placement of plate behind back in supine needed to obtain x-ray at this time. Left handouts on energy conservation strategies as pertains to basic ADL management.       If plan is discharge home, recommend the following:   A little help with bathing/dressing/bathroom;A little help with walking and/or transfers;Help with stairs or ramp for entrance;A lot of help with bathing/dressing/bathroom;A lot of help with walking and/or transfers     Functional Status Assessment   Patient has had a recent decline in their functional status and demonstrates the ability to make significant improvements in function in a reasonable and predictable amount of time.     Equipment Recommendations   None recommended by OT     Recommendations for Other Services   PT consult      Precautions/Restrictions   Precautions Precautions: Fall Recall of Precautions/Restrictions: Intact Restrictions Weight Bearing Restrictions Per Provider Order: No     Mobility Bed Mobility Overal bed mobility: Needs Assistance Bed Mobility: Rolling Rolling: Min assist         General bed mobility comments: increased back pain with any movement. Rolled to each side with min A and increased time/effort.    Transfers                   General transfer comment: unable to assess d/t 8/10 FACES back and generalized pain at rest.      Balance Overall balance assessment:  (unable to assess d/t pain)                                         ADL either performed or assessed with clinical judgement   ADL Overall ADL's : Needs assistance/impaired Eating/Feeding: Bed level;Modified independent   Grooming: Supervision/safety;Bed level   Upper Body Bathing: Moderate assistance;Bed level   Lower Body Bathing: Moderate assistance;Bed level   Upper Body Dressing : Moderate assistance;Bed level   Lower Body Dressing: Moderate assistance;Bed level                 General ADL Comments: Pt observed rolling to each side with +1 of radiology techician. Pt ultimately unable to tolerate placement of plate needed for x-ray at this time.     Vision         Perception         Praxis  Pertinent Vitals/Pain Pain Assessment Pain Assessment: Faces Faces Pain Scale: Hurts whole lot Pain Location: back and all over Pain Descriptors / Indicators: Grimacing, Guarding, Discomfort Pain Intervention(s): Limited activity within patient's tolerance, Monitored during session, Repositioned, Patient requesting pain meds-RN notified     Extremity/Trunk Assessment Upper Extremity Assessment Upper Extremity Assessment: Generalized weakness   Lower Extremity Assessment Lower Extremity Assessment: Defer to PT evaluation   Cervical / Trunk  Assessment Cervical / Trunk Assessment: Other exceptions (acute on chronic back pain)   Communication Communication Communication: No apparent difficulties   Cognition Arousal: Alert Behavior During Therapy: WFL for tasks assessed/performed Cognition: No apparent impairments                               Following commands: Intact       Cueing  General Comments   Cueing Techniques: Verbal cues  spouse present and provided history and PLOF data.   Exercises     Shoulder Instructions      Home Living Family/patient expects to be discharged to:: Private residence Living Arrangements: Spouse/significant other Available Help at Discharge: Family;Available PRN/intermittently Type of Home: House Home Access: Stairs to enter [employer] of Steps: 4 Entrance Stairs-Rails: Left Home Layout: One level     Bathroom Shower/Tub: IT trainer: Standard Bathroom Accessibility: Yes How Accessible: Accessible via walker Home Equipment: Rolling Walker (2 wheels);BSC/3in1;Shower seat;Hand held shower head;Wheelchair - manual   Additional Comments: works from home and wife able to work from home      Prior Functioning/Environment Prior Level of Function : Independent/Modified Independent             Mobility Comments: uses rw; O2 dependent at 3L. recently needing more assist.      OT Problem List: Decreased strength;Decreased activity tolerance;Impaired balance (sitting and/or standing);Decreased safety awareness;Decreased knowledge of use of DME or AE;Decreased knowledge of precautions;Cardiopulmonary status limiting activity;Pain   OT Treatment/Interventions: Self-care/ADL training;Therapeutic exercise;Energy conservation;DME and/or AE instruction;Therapeutic activities;Patient/family education;Balance training      OT Goals(Current goals can be found in the care plan section)   Acute Rehab OT Goals Patient Stated  Goal: pain management OT Goal Formulation: With patient/family Time For Goal Achievement: 07/17/23 Potential to Achieve Goals: Fair ADL Goals Pt Will Perform Grooming: with set-up;sitting Pt Will Perform Upper Body Dressing: with set-up;sitting Pt Will Perform Lower Body Dressing: with min assist;with contact guard assist;sit to/from stand Pt Will Transfer to Toilet: with contact guard assist;stand pivot transfer Pt Will Perform Toileting - Clothing Manipulation and hygiene: with contact guard assist;sit to/from stand   OT Frequency:  Min 2X/week    Co-evaluation              AM-PAC OT "6 Clicks" Daily Activity     Outcome Measure Help from another person eating meals?: None Help from another person taking care of personal grooming?: A Little Help from another person toileting, which includes using toliet, bedpan, or urinal?: A Lot Help from another person bathing (including washing, rinsing, drying)?: A Lot Help from another person to put on and taking off regular upper body clothing?: A Little Help from another person to put on and taking off regular lower body clothing?: A Lot 6 Click Score: 16   End of Session Equipment Utilized During Treatment: Oxygen Nurse Communication: Patient requests pain meds  Activity Tolerance: Patient limited by pain Patient left: in bed;with call bell/phone within reach;with family/visitor present  OT Visit Diagnosis: Unsteadiness on feet (R26.81);Muscle weakness (generalized) (M62.81);Pain                Time: 0865-7846 OT Time Calculation (min): 12 min Charges:  OT General Charges $OT Visit: 1 Visit OT Evaluation $OT Eval Moderate Complexity: 1 Mod  Lael Pierce, OT Acute Rehabilitation Services Office: [phone]   Brinton Canavan 07/03/2023, 11:51 AM

## 2023-07-03 NOTE — Progress Notes (Signed)
   07/03/23 1013  TOC Brief Assessment  Insurance and Status Reviewed  Patient has primary care physician Yes  Home environment has been reviewed single family home  Prior level of function: independent  Prior/Current Home Services No current home services  Social Drivers of Health Review SDOH reviewed no interventions necessary  Readmission risk has been reviewed Yes  Transition of care needs transition of care needs identified, TOC will continue to follow    CSW met with pt and family at bedside to discuss discharge planning. Pt receives home O2 through [employer]. Pt's wife reports she will bring travel tank to hospital upon discharge. Pt reports he has a rolling walker and wheelchair at home, no current DME needs at this time. TOC will continue to follow for needs.  [place], MSW, LCSW 07/03/2023 10:16 AM

## 2023-07-03 NOTE — Consult Note (Signed)
 Palliative Medicine Inpatient Consult Note  Consulting Provider:  Vita Grip, MD   Reason for consult:   Palliative Care Consult Services Palliative Medicine Consult   Symptom Management Consult  Reason for Consult? Goals of care conversation, management of cancer pain   07/03/2023  HPI:  Per intake H&P -->   John [name] is a 63 y.o. 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  Palliative care has been asked to support symptom needs.   Clinical Assessment/Goals of Care:  *Please note that this is a verbal dictation therefore any spelling or grammatical errors are due to the "Dragon Medical One" system interpretation.  I have reviewed medical records including EPIC notes, labs and imaging, received report from bedside RN, assessed the patient who is lying in bed in significant distress from his lower back pain.    I met with John [name] and his spouse, Delores Fester to further discuss diagnosis prognosis, GOC, EOL wishes, disposition and options.   I introduced Palliative Medicine as specialized medical care for people living with serious illness. It focuses on providing relief from the symptoms and stress of a serious illness. The goal is to improve quality of life for both the patient and the family.  Medical History Review and Understanding:  John [name] has a past medical history significant for COPD, chronic hypoxemic respiratory failure-on 3 L nasal cannula at home at baseline, stage IV squamous cell carcinoma of the lung, depression, rheumatoid arthritis, GERD, pAF, recent cholecystectomy, &  depression.  Social History:  Guhan lives in [place], [place] . He has been married to his wife, Delores Fester for the past nineteen years. He has a son from a prior relationship and she has a daughter. John [name] shares he has a new grandchild though has not been able to spend much time with them due to his health. He does still actively work remotely. 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 Irbin is a man of faith practicing within Christianity.   Functional and Nutritional State:  Preceding hospitalization, Jaspal was living at home and able to attend to his daily care needs. This changed roughly 8-10 days ago when he had worsening back pain and felt his legs could not support him. He has limitations his mobility since then.   Advance Directives:  A detailed discussion was had today regarding advanced directives.  Patient does not have advanced directives. I did emphasize the importance of the documents and provided a blank copy to patient and family.   Code Status:  Concepts specific to code status, artifical feeding and hydration, continued IV antibiotics and rehospitalization was had.  The difference between a aggressive medical intervention path  and a palliative comfort care path for this patient at this time was had.   Patient shares that from his perspective as of presently his cancer has not worsened. He feel that he would like to continue present measures. I shared we often cause great burden and trauma during resuscitative efforts, described the importance of considering this further.   Patient and his wife will discuss this more with one another when patients  symptoms are under better management.   Provided  "Hard Choices for [employer]" booklet.   Discussion:  Per Gwinda Leopard and his wife, he was seen at Dr. Nevelyn Barber office when the pain started and it was identified that he would start back on  chemotherapy and after five cycles get a pet scan. During this time though John [name] has been admitted for his gallbladder and required surgical intervention. We reviewed that patient had still had pain which is why he followed up with Dr. Maria Shiner on May 8th.  He shares a lack of improvement despite taking prescribed medications (percocet & lidoderm  patch).  John [name] shares that his reasoning for hospitalization is predominantly related to his poorly controlled pain. At home he takes percocet though this was not addressing the pain and it appeared to be worsening despite medication administration. Jad describes the pain as constant and that it starts in his back and "shoots" throughout his whole body. We reviewed that John [name] was offered a fentanyl  patch by Dr. Maria Shiner but did not take it. We discussed what would happened in the event of an overdose which was on of John [name] fears. John [name] received ketamine in the ER which he notes he "never wants again". Education was provided that his experience may have been difference had ativan  been given congruently.   John [name] shares that as a result of the pain he feels anxious. He takes paxil .  John [name] in addition to the above symptoms shares that he has not had a good bowel movement in five days. We discussed adding a regiment to better support this.   Lyam's wife shares it would be of value for them to gain the insights of neurosurgery in regards to John [name]'s compression deformities of T10-L2. We also discussed considering IR review as well as it appears Dr. Hershal Loron did the original procedure.   Patients wife shares that John [name] contacted [facility] twice telephonically but never came out to see him. She notes it was of little help to them. I shared that I believe it would be beneficial to refer John [name] to the Palliative care clinic at the cancer center.   Discussed the importance of continued conversation with family and their  medical providers regarding  overall plan of care and treatment options, ensuring decisions are within the context of the patients values and GOCs.  Decision Maker: John [name],John [name] (Spouse): [phone] (Mobile)   SUMMARY OF RECOMMENDATIONS   Full Code / Full Scope of Care  Advanced Directives provided for review and completion  Will refer to our Palliative care clinic at the Cancer Center to support symptoms more comprehensively  Symptom Management: Lower Back Pain: - Stop percocet  - Start fentanyl  patch 25mch/hr - Start dilaudid  2mg  PO TID x3 doses - Start dilaudid  2mg  PO Q4H PRN tomorrow for moderation pain - Continue dilaudid  1mg  IVP Q4H PRN severe breakthrough pain - Continue Lidoderm  patch (12 hours on 12 hours off) - Methocarbamol  has been completed - Start gabapentin  100mg  PO at bedtime - Patient and his wife are hoping to have Neurosurgery and/or IR review films to see if additional interventions would be warranted  Constipation: - Senna 2 Tabs PO BID - Miralax  17g Daily - Bisacodyl  suppository 10mg  PR Qday PRN  Anxiety/Depression: - Continue Paxil    Code Status/Advance Care Planning: FULL CODE  Palliative Prophylaxis:  Aspiration, Bowel Regimen, Delirium Protocol, Frequent Pain Assessment, Oral Care, Palliative Wound Care, and Turn Reposition  Additional Recommendations (Limitations, Scope, Preferences): Continue present care  Psycho-social/Spiritual:  Desire for further  Chaplaincy support: Yes for advanced directives Additional Recommendations: Acute on Chronic pain management   Prognosis: Difficult to determine.   Discharge Planning: To be determined  Vitals:   07/03/23 0432 07/03/23 0626  BP: (!) 137/97   Pulse: 81   Resp: 20 16  Temp: (!) 97.5 F (36.4 C)   SpO2: 95% 95%    Intake/Output Summary (Last 24 hours) at 07/03/2023 6045 Last data filed at 07/03/2023 0300 Gross per 24 hour  Intake 1203.88 ml  Output 500 ml  Net 703.88 ml   Last Weight  Most recent update:  07/03/2023  1:55 AM    Weight  106.1 kg (234 lb)            Gen:  Older Caucasian M in distress HEENT: moist mucous membranes CV: Regular rate and irregular rhythm  PULM: On 4LPM [place], breathing is not labored ABD: soft/nontender  EXT: No edema  Neuro: Alert and oriented x3   PPS: 50%   This conversation/these recommendations were discussed with patient primary care team, Dr. Wendall Halls  Total Time: 120 Billing based on MDM: High  ______________________________________________________ [facility] [place] Palliative Medicine Team Team Cell Phone: [phone] Please utilize secure chat with additional questions, if there is no response within 30 minutes please call the above phone number  Palliative Medicine Team providers are available by phone from 7am to 7pm daily and can be reached through the team cell phone.  Should this patient require assistance outside of these hours, please call the patient's attending physician.

## 2023-07-03 NOTE — Progress Notes (Signed)
 PROGRESS NOTE    John [name]  ZOX:[id number] DOB: [birth year] DOA: 07/02/2023 PCP: Patient, No Pcp Per     Brief Narrative:  John [name] is a 63 y.o. 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  Patient reports she has had back pain for months but the pain was too severe today to tolerate.  His home Percocet is not controlling his pain.  He describes the pain as sharp in his low back, radiating to both legs. He also reports difficulty breathing today.    On EMS arrival, patient was found to have SpO2 of 88%.  He did require up to 15 L high flow oxygen.  He was started on empiric IV antibiotics to cover HCAP.  New events last 24 hours / Subjective: Patient just received Robaxin , patient and wife meeting with palliative care medicine.  Pain seems reasonably controlled, wondering about further intervention for his back pain.  Oxygen down to 4 L  Assessment & Plan:   Principal Problem:   Acute on chronic respiratory failure with hypoxia (HCC) Active Problems:   Hypokalemia   Hyponatremia   Intractable back pain   Pain of metastatic malignancy   HCAP (healthcare-associated pneumonia)   Acute on chronic hypoxic respiratory failure, with HCAP, history of pulmonary fibrosis, COPD, stage IV lung cancer - Uses 3 L nasal cannula O2 at baseline.  He desaturated to 83% and required up to 15 L high flow nasal cannula.  Currently weaned down to 4 L - MRSA PCR negative - Strep pneumo antigen negative - Legionella antigen pending - Check COVID, RSV, influenza, respiratory viral panel - Empiric cefepime   Intractable  back pain - Appreciate palliative care medicine for symptom management.  Started fentanyl  patch, Dilaudid , Lidoderm  patch, Robaxin , gabapentin  - CT thoracic spine revealed chronic compression deformities T10-L2.  He did have kyphoplasty of T12 back in July 2023, and kyphoplasty L1 and L3 in August 2023 - Consult IR for consideration for kyphoplasty per family request  Stage IV lung cancer - Currently under active therapy with  Gemzar  and Navelbine  - Followed by Dr. Maria Shiner  Hypovolemic hyponatremia - Urine osmole pending  - Urine sodium 129  Paroxysmal A-fib - Toprol , amiodarone , Eliquis   Generalized weakness - PT OT  Constipation - Check KUB - Bowel regimen  AAA - Stable 3.3 cm infrarenal abdominal aortic aneurysm. Recommend follow-up ultrasound every 3 years.   DVT prophylaxis:   apixaban  (ELIQUIS ) tablet 5 mg  Code Status: Full code Family Communication: At bedside Disposition Plan: Home Status is: Inpatient Remains inpatient appropriate because: IV antibiotics, further pain control    Antimicrobials:  Anti-infectives (From admission, onward)    Start     Dose/Rate Route Frequency Ordered Stop   07/03/23 0800  vancomycin  (VANCOCIN ) IVPB 1000 mg/200 mL premix  Status:  Discontinued        1,000 mg 200 mL/hr over 60 Minutes Intravenous Every 12 hours 07/02/23 2146 07/03/23 0722   07/03/23 0200  ceFEPIme  (MAXIPIME ) 2 g in sodium chloride  0.9 % 100 mL IVPB        2 g 200 mL/hr over 30 Minutes Intravenous Every 8 hours 07/02/23 2141  07/02/23 1845  vancomycin  (VANCOREADY) IVPB 2000 mg/400 mL        2,000 mg 200 mL/hr over 120 Minutes Intravenous  Once 07/02/23 1813 07/02/23 2120   07/02/23 1815  vancomycin  (VANCOCIN ) IVPB 1000 mg/200 mL premix  Status:  Discontinued        1,000 mg 200 mL/hr over 60 Minutes Intravenous  Once 07/02/23 1807 07/02/23 1813   07/02/23 1815  ceFEPIme  (MAXIPIME ) 2 g in sodium chloride  0.9 % 100 mL IVPB        2 g 200 mL/hr over 30  Minutes Intravenous  Once 07/02/23 1807 07/02/23 1911        Objective: Vitals:   07/03/23 0432 07/03/23 0626 07/03/23 0913 07/03/23 1009  BP: (!) 137/97   133/87  Pulse: 81   93  Resp: 20 16    Temp: (!) 97.5 F (36.4 C)   97.7 F (36.5 C)  TempSrc: Oral   Oral  SpO2: 95% 95% 96% 93%  Weight:      Height:        Intake/Output Summary (Last 24 hours) at 07/03/2023 1233 Last data filed at 07/03/2023 4098 Gross per 24 hour  Intake 1203.88 ml  Output 700 ml  Net 503.88 ml   Filed Weights   07/03/23 0155  Weight: 106.1 kg    Examination:  General exam: Appears calm  Respiratory system: Diminished breath sounds bilaterally, respiratory effort is normal without distress Cardiovascular system: S1 & S2 heard, RRR. No murmurs. No pedal edema. Gastrointestinal system: Abdomen is nondistended, soft and nontender. Normal bowel sounds heard. Central nervous system: Alert and oriented. No focal neurological deficits. Speech clear.  Extremities: Symmetric in appearance  Skin: No rashes, lesions or ulcers on exposed skin  Psychiatry: Judgement and insight appear normal. Mood & affect appropriate.   Data Reviewed: I have personally reviewed following labs and imaging studies  CBC: Recent Labs  Lab 07/01/23 0814 07/02/23 1423 07/03/23 0500  WBC 4.3 1.3* 1.5*  NEUTROABS 3.9 0.6*  --   HGB 12.6* 11.3* 11.4*  HCT 36.8* 33.3* 32.8*  MCV 88.0 89.3 87.9  PLT 259 175 211   Basic Metabolic Panel: Recent Labs  Lab 07/01/23 0814 07/02/23 1423 07/02/23 1907 07/02/23 2330 07/03/23 0500  NA 132* 124*  --  122* 123*  K 4.2 3.3*  --   --  3.8  CL 97* 91*  --   --  91*  CO2 24 24  --   --  22  GLUCOSE 176* 103*  --   --  121*  BUN 21 22  --   --  17  CREATININE 1.04 0.87  --   --  0.83  CALCIUM  8.9 8.4*  --   --  7.8*  MG  --   --  1.8  --   --   PHOS  --   --  2.4*  --  4.0   GFR: Estimated Creatinine Clearance: 115.8 mL/min (by C-G formula based on SCr of 0.83  mg/dL). Liver Function Tests: Recent Labs  Lab 07/01/23 0814 07/02/23 1423 07/03/23 0500  AST 42* 28 29  ALT 55* 50* 48*  ALKPHOS 84 71 67  BILITOT 1.1 1.6* 1.3*  PROT 6.5 5.8* 5.5*  ALBUMIN  4.3 3.3* 3.3*   No results for input(s): "LIPASE", "AMYLASE" in the last 168 hours. No results for input(s): "AMMONIA" in the last 168 hours. Coagulation Profile: No results for input(s): "INR", "PROTIME" in the last 168 hours. Cardiac Enzymes: No  results for input(s): "CKTOTAL", "CKMB", "CKMBINDEX", "TROPONINI" in the last 168 hours. BNP (last 3 results) No results for input(s): "PROBNP" in the last 8760 hours. HbA1C: No results for input(s): "HGBA1C" in the last 72 hours. CBG: No results for input(s): "GLUCAP" in the last 168 hours. Lipid Profile: No results for input(s): "CHOL", "HDL", "LDLCALC", "TRIG", "CHOLHDL", "LDLDIRECT" in the last 72 hours. Thyroid  Function Tests: Recent Labs    07/02/23 1947 07/02/23 2000  TSH 1.306  --   FREET4  --  1.79*   Anemia Panel: No results for input(s): "VITAMINB12", "FOLATE", "FERRITIN", "TIBC", "IRON", "RETICCTPCT" in the last 72 hours. Sepsis Labs: Recent Labs  Lab 07/02/23 1907  PROCALCITON 0.11    Recent Results (from the past 240 hours)  Blood culture (routine x 2)     Status: None   Collection Time: 06/23/23  8:15 PM   Specimen: BLOOD  Result Value Ref Range Status   Specimen Description   Final    BLOOD LEFT ANTECUBITAL Performed at [facility], [address] [place], [place] [address]    Special Requests   Final    BOTTLES DRAWN AEROBIC AND ANAEROBIC Blood Culture adequate volume Performed at [facility], [address] [place], [place] [address]    Culture   Final    NO GROWTH 5 DAYS Performed at [facility], [address] [place], [place] [address]    Report Status 06/28/2023 FINAL  Final  Blood culture (routine x 2)     Status: None   Collection Time: 06/23/23   8:20 PM   Specimen: BLOOD  Result Value Ref Range Status   Specimen Description   Final    BLOOD RIGHT ANTECUBITAL Performed at [facility], [address], [place], [place] [address]    Special Requests   Final    BOTTLES DRAWN AEROBIC AND ANAEROBIC Blood Culture adequate volume Performed at [facility], [address], [place], [place] [address]    Culture   Final    NO GROWTH 5 DAYS Performed at [facility], [address], [place], [place] [address]    Report Status 06/28/2023 FINAL  Final  Culture, blood (Routine X 2) w Reflex to ID Panel     Status: None (Preliminary result)   Collection Time: 07/02/23 10:15 PM   Specimen: BLOOD RIGHT ARM  Result Value Ref Range Status   Specimen Description   Final    BLOOD RIGHT ARM Performed at [facility], [address] [place], [place] [address]    Special Requests   Final    BOTTLES DRAWN AEROBIC AND ANAEROBIC Blood Culture results may not be optimal due to an inadequate volume of blood received in culture bottles Performed at [facility], [address] [place], [place] [address]    Culture   Final    NO GROWTH < 12 HOURS Performed at [facility], [address] [place], [place] [address]    Report Status PENDING  Incomplete  Culture, blood (Routine X 2) w Reflex to ID Panel     Status: None (Preliminary result)   Collection Time: 07/02/23 10:15 PM   Specimen: BLOOD RIGHT ARM  Result Value Ref Range Status   Specimen Description   Final    BLOOD RIGHT ARM Performed at [facility], [address], [place], [place] [address]    Special Requests   Final  BOTTLES DRAWN AEROBIC AND ANAEROBIC Blood Culture results may not be optimal due to an inadequate volume of blood received in culture bottles Performed at [facility], [address] [place], [place] [address]    Culture   Final    NO GROWTH < 12 HOURS Performed  at [facility], [address], [place], [place] [address]    Report Status PENDING  Incomplete  MRSA Next Gen by PCR, Nasal     Status: None   Collection Time: 07/02/23 11:57 PM   Specimen: Urine, Clean Catch; Nasal Swab  Result Value Ref Range Status   MRSA by PCR Next Gen NOT DETECTED NOT DETECTED Final    Comment: (NOTE) The GeneXpert MRSA Assay (FDA approved for NASAL specimens only), is one component of a comprehensive MRSA colonization surveillance program. It is not intended to diagnose MRSA infection nor to guide or monitor treatment for MRSA infections. Test performance is not FDA approved in patients less than 52 years old. Performed at [facility], [address] [place], [place] [address]       Radiology Studies: CT Angio Chest PE W and/or Wo Contrast Result Date: 07/02/2023 CLINICAL DATA:  Right-sided chest pain. EXAM: CT ANGIOGRAPHY CHEST WITH CONTRAST TECHNIQUE: Multidetector CT imaging of the chest was performed using the standard protocol during bolus administration of intravenous contrast. Multiplanar CT image reconstructions and MIPs were obtained to evaluate the vascular anatomy. RADIATION DOSE REDUCTION: This exam was performed according to the departmental dose-optimization program which includes automated exposure control, adjustment of the mA and/or kV according to patient size and/or use of iterative reconstruction technique. CONTRAST:  75mL OMNIPAQUE  IOHEXOL  350 MG/ML SOLN COMPARISON:  CT angiogram chest abdomen and pelvis 06/23/2023. FINDINGS: Cardiovascular: Satisfactory opacification of the pulmonary arteries to the segmental level. No evidence of pulmonary embolism. Normal heart size. No pericardial effusion. Mediastinum/Nodes: No enlarged mediastinal, hilar, or axillary lymph nodes. Thyroid  gland, trachea, and esophagus demonstrate no significant findings. Lungs/Pleura: Mild emphysema again noted in the lung apices. Multifocal areas of  ground-glass opacity are again seen throughout both lungs along with some peripheral reticular opacities. Fibrotic changes are again noted in the right lung base. There is some focal airspace opacity in the right perihilar region and superior segment of the right lower lobe with some central cavitation. There is no pleural effusion or pneumothorax. Compared to 06/23/2023 there has been no significant interval change. Upper Abdomen: No acute abnormality. Cholecystectomy clips are present. Infrarenal abdominal aortic aneurysm measures up to 3.3 cm and appears unchanged. Musculoskeletal: No acute osseous findings. Review of the MIP images confirms the above findings. IMPRESSION: 1. No evidence for pulmonary embolism. 2. Stable multifocal areas of ground-glass opacity throughout both lungs along with some peripheral reticular opacities. Findings are favored as infectious/inflammatory. 3. Stable focal airspace opacity in the right perihilar region and superior segment of the right lower lobe with some central cavitation. Findings may be infectious/inflammatory. 4. Stable 3.3 cm infrarenal abdominal aortic aneurysm. Recommend follow-up ultrasound every 3 years. Aortic Atherosclerosis (ICD10-I70.0) and Emphysema (ICD10-J43.9). Electronically Signed   By: Tyron Gallon M.D.   On: 07/02/2023 17:48   CT T-SPINE NO CHARGE Result Date: 07/02/2023 CLINICAL DATA:  Lower back and bilateral lower extremity pain, stage IV lung cancer EXAM: CT Thoracic Spine with contrast TECHNIQUE: Multiplanar CT images of the thoracic spine were reconstructed from contemporary CT of the Chest. RADIATION DOSE REDUCTION: This exam was performed according to the departmental dose-optimization program  which includes automated exposure control, adjustment of the mA and/or kV according to patient size and/or use of iterative reconstruction technique. CONTRAST:  No additional COMPARISON:  06/23/2023 FINDINGS: Alignment: Alignment is grossly anatomic.  Vertebrae: Stable compression deformities at T10, T11, T12, L1, and L2. Prior vertebral augmentations seen at T12 and L1. There are no acute displaced fractures. No destructive bony abnormalities. Paraspinal and other soft tissues: Paraspinal soft tissues are unremarkable. Continue consolidation cavitation within the right lower lobe. Please see separate CT chest report for findings in that region. Disc levels: There is no significant bony encroachment upon the central canal or neural foramina. Reconstructed images demonstrate no additional findings. IMPRESSION: 1. Chronic compression deformities from T10 through L2, with evidence of prior vertebral augmentation at T12 and L1. No acute thoracic spine fracture. 2. Please refer to separate CT chest report for findings in that region. Electronically Signed   By: Bobbye Burrow M.D.   On: 07/02/2023 17:47   DG Chest Port 1 View Result Date: 07/02/2023 CLINICAL DATA:  Lower back and bilateral lower extremity pain, stage IV lung cancer EXAM: PORTABLE CHEST 1 VIEW COMPARISON:  06/23/2023 FINDINGS: Single frontal view of the chest demonstrates a stable cardiac silhouette. Fibrotic changes in the right lung are stable, consistent with post treatment appearance in this patient with history of lung cancer. There is progressive consolidation at the right lung base which may reflect new airspace disease or atelectasis. No effusion or pneumothorax. Left chest is clear. No acute bony abnormalities. IMPRESSION: 1. Increasing right basilar consolidation superimposed upon chronic post radiation fibrosis, which may reflect acute airspace disease or atelectasis. Electronically Signed   By: Bobbye Burrow M.D.   On: 07/02/2023 14:48      Scheduled Meds:  amiodarone   200 mg Oral Daily   apixaban   5 mg Oral BID   budesonide -glycopyrrolate -formoterol   2 puff Inhalation BID   feeding supplement  237 mL Oral TID BM   fentaNYL   1 patch Transdermal Q72H   gabapentin   100 mg Oral  QHS   HYDROmorphone   2 mg Oral TID   lidocaine   1 patch Transdermal Q24H   metoprolol  succinate  25 mg Oral Daily   PARoxetine   10 mg Oral QHS   polyethylene glycol  17 g Oral Daily   predniSONE   5 mg Oral Q breakfast   senna-docusate  2 tablet Oral BID   Continuous Infusions:  ceFEPime  (MAXIPIME ) IV 2 g (07/03/23 0937)     LOS: 1 day   Time spent: 45 minutes   Daren Eck, DO [facility] 07/03/2023, 12:33 PM   Available via Epic secure chat 7am-7pm After these hours, please refer to coverage provider listed on [email or web address]

## 2023-07-03 NOTE — Plan of Care (Signed)
  Problem: Education: Goal: Knowledge of General Education information will improve Description: Including pain rating scale, medication(s)/side effects and non-pharmacologic comfort measures Outcome: Progressing   Problem: Clinical Measurements: Goal: Ability to maintain clinical measurements within normal limits will improve Outcome: Progressing Goal: Diagnostic test results will improve Outcome: Progressing Goal: Respiratory complications will improve Outcome: Progressing   Problem: Pain Managment: Goal: General experience of comfort will improve and/or be controlled Outcome: Progressing   Problem: Safety: Goal: Ability to remain free from injury will improve Outcome: Progressing   Problem: Activity: Goal: Risk for activity intolerance will decrease Outcome: Not Progressing

## 2023-07-04 ENCOUNTER — Inpatient Hospital Stay (HOSPITAL_COMMUNITY)

## 2023-07-04 DIAGNOSIS — J9621 Acute and chronic respiratory failure with hypoxia: Secondary | ICD-10-CM | POA: Diagnosis not present

## 2023-07-04 DIAGNOSIS — C3491 Malignant neoplasm of unspecified part of right bronchus or lung: Secondary | ICD-10-CM | POA: Diagnosis not present

## 2023-07-04 DIAGNOSIS — Z7189 Other specified counseling: Secondary | ICD-10-CM | POA: Diagnosis not present

## 2023-07-04 DIAGNOSIS — C779 Secondary and unspecified malignant neoplasm of lymph node, unspecified: Secondary | ICD-10-CM

## 2023-07-04 DIAGNOSIS — M549 Dorsalgia, unspecified: Secondary | ICD-10-CM | POA: Diagnosis not present

## 2023-07-04 DIAGNOSIS — Z515 Encounter for palliative care: Secondary | ICD-10-CM | POA: Diagnosis not present

## 2023-07-04 DIAGNOSIS — C7970 Secondary malignant neoplasm of unspecified adrenal gland: Secondary | ICD-10-CM

## 2023-07-04 DIAGNOSIS — D709 Neutropenia, unspecified: Secondary | ICD-10-CM

## 2023-07-04 LAB — BASIC METABOLIC PANEL WITH GFR
Anion gap: 10 (ref 5–15)
BUN: 14 mg/dL (ref 8–23)
CO2: 19 mmol/L — ABNORMAL LOW (ref 22–32)
Calcium: 8.1 mg/dL — ABNORMAL LOW (ref 8.9–10.3)
Chloride: 95 mmol/L — ABNORMAL LOW (ref 98–111)
Creatinine, Ser: 0.78 mg/dL (ref 0.61–1.24)
GFR, Estimated: >60 mL/min (ref >60)
Glucose, Bld: 105 mg/dL — ABNORMAL HIGH (ref 70–99)
Potassium: 3.5 mmol/L (ref 3.5–5.1)
Sodium: 124 mmol/L — ABNORMAL LOW (ref 135–145)

## 2023-07-04 LAB — CBC
HCT: 31.8 % — ABNORMAL LOW (ref 39.0–52.0)
Hemoglobin: 11.4 g/dL — ABNORMAL LOW (ref 13.0–17.0)
MCH: 31.6 pg (ref 26.0–34.0)
MCHC: 35.8 g/dL (ref 30.0–36.0)
MCV: 88.1 fL (ref 80.0–100.0)
Platelets: 188 10*3/uL (ref 150–400)
RBC: 3.61 MIL/uL — ABNORMAL LOW (ref 4.22–5.81)
RDW: 18.3 % — ABNORMAL HIGH (ref 11.5–15.5)
WBC: 1 10*3/uL — CL (ref 4.0–10.5)
nRBC: 0 % (ref 0.0–0.2)

## 2023-07-04 LAB — PROTIME-INR
INR: 1.4 — ABNORMAL HIGH (ref 0.8–1.2)
Prothrombin Time: 17 s — ABNORMAL HIGH (ref 11.4–15.2)

## 2023-07-04 MED ORDER — FILGRASTIM-AAFI 480 MCG/0.8ML IJ SOSY
480.0000 ug | PREFILLED_SYRINGE | Freq: Every day | INTRAMUSCULAR | Status: DC
Start: 2023-07-04 — End: 2023-07-05
  Filled 2023-07-04 (×2): qty 0.8

## 2023-07-04 MED ORDER — HYDROMORPHONE HCL 2 MG PO TABS
2.0000 mg | ORAL_TABLET | Freq: Three times a day (TID) | ORAL | Status: DC
  Administered 2023-07-04 – 2023-07-05 (×4): 2 mg via ORAL
  Filled 2023-07-04 (×4): qty 1

## 2023-07-04 MED ORDER — FILGRASTIM 480 MCG/1.6ML IJ SOLN
480.0000 ug | Freq: Every day | INTRAMUSCULAR | Status: DC
  Filled 2023-07-04: qty 1.6

## 2023-07-04 MED ORDER — KETOROLAC TROMETHAMINE 30 MG/ML IJ SOLN: 30.0000 mg | Freq: Three times a day (TID) | INTRAMUSCULAR | Status: DC

## 2023-07-04 MED ORDER — SODIUM CHLORIDE 1 G PO TABS
1.0000 g | ORAL_TABLET | Freq: Two times a day (BID) | ORAL | Status: DC
  Administered 2023-07-04 – 2023-07-20 (×33): 1 g via ORAL
  Filled 2023-07-04 (×36): qty 1

## 2023-07-04 MED ORDER — GADOBUTROL 1 MMOL/ML IV SOLN
10.0000 mL | Freq: Once | INTRAVENOUS | Status: AC | PRN
  Administered 2023-07-04: 10 mL via INTRAVENOUS

## 2023-07-04 NOTE — Progress Notes (Signed)
   07/04/23 1540  Spiritual Encounters  Type of Visit Initial  Care provided to: Patient  Referral source Physician  Reason for visit Routine spiritual support   Per referral from palliative care team, I visited with Mr. Satya Burks.  Mr. Mione received my visit with gratitude and engaged with me around his Chrisitian faith, including how that has been a factor through his career and past health challenges. He processed emotions and events that have held significance in meaning  making.  I provided compassionate presence and facilitated theological reflection. I provided active and reflective listening. I provided prayer with patient consent. Mr. Clingan knows spiritual care support remains available.  Will continue to follow as able. Elvenia Godden L. Minetta Aly, M.Div [phone]

## 2023-07-04 NOTE — Progress Notes (Signed)
 PT Cancellation Note  Patient Details Name: John [name] MRN: [id number] DOB: [birth year]   Cancelled Treatment:    Reason Eval/Treat Not Completed: Pain limiting ability to participate. Pt did not feel he could tolerate any mobility. Encouraged him to continue discussions with hospitalist and palliative about pain control issues. MRI has been ordered per patient and char review. Will check back another day. Pt reports he is interested in working with therapy.     Tanda Falter, PT Acute Rehabilitation  Office: [phone]

## 2023-07-04 NOTE — Progress Notes (Addendum)
 Palliative Medicine Inpatient Follow Up Note HPI: John [name] is a 63 y.o. 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  Palliative care has been asked to support symptom needs.   Today's Discussion 07/04/2023  *Please note that this is a verbal dictation therefore any spelling or grammatical errors are due to the "Dragon Medical One" system interpretation.  Chart reviewed inclusive of vital signs, progress notes, laboratory results, and diagnostic images.   I met with John [name] at bedside this morning. He was in the company of his wife, John [name]. John [name] shares that he had an uptick in pain this morning. This is notable as I am at bedside as he is groaning and generally uncomfortable in appearance.  Nicolus shares that yesterday went very well from the perspective of pain control though from the early morning hours on his pain has increased. Donalds RN does share that he was moved for morning care.  We discussed that patients standing dilaudid  had stopped in the early morning. Maxxon requests that this be continued at this time which I shared agreement with. He notes that he has been ordered to receive tordol and in the past has responded very poorly to this medication. I shared that we can stop this. We reviewed the other medicine does seem to be helping.  From the perspective of Banner's constipation - this is improving. He shares that he has had a large bowel movement.  John [name] notes he slept well until his pain worsened this morning.  Ronrico feels that his anxiety is under good control.   Patient requests more  information on counseling and support in the setting of his cancer.   Created space and opportunity for patient to explore thoughts feelings and fears regarding current medical situation.  We reviewed the plan for the day inclusive of patient getting an MRI of his spine and being evaluated by the IR team.   Questions and concerns addressed/Palliative Support Provided.   Objective Assessment: Vital Signs Vitals:   07/04/23 1248 07/04/23 1400  BP: (!) 89/71 98/66  Pulse: 96   Resp: 20   Temp: 98.5 F (36.9 C)   SpO2: 92%     Intake/Output Summary (Last 24 hours) at 07/04/2023 1427 Last data filed at 07/04/2023 1115 Gross per 24 hour  Intake 942.8 ml  Output 2225 ml  Net -1282.2 ml   Last Weight  Most recent update: 07/03/2023  1:55 AM    Weight  106.1 kg (234 lb)            Gen:  Older Caucasian M in distress HEENT: moist mucous membranes CV: Regular rate and irregular rhythm  PULM: On 4LPM [place], breathing is not labored ABD: soft/nontender  EXT: No edema  Neuro: Alert and oriented x3   SUMMARY OF RECOMMENDATIONS   Full Code / Full Scope of Care   Advanced Directives provided for review and completion  Have spoken to the chaplain team for additional resources on support - they plan to connect with John [name] and his spouse   Will [facility] to support symptoms more comprehensively   Symptom Management: Lower Back Pain: -  Stop Tordol - patient shares prior intolerance - Continue fentanyl  patch 25mch/hr - Continue dilaudid  2mg  PO TID   - Continue dilaudid  2mg  PO Q4H PRN  for moderation pain - Continue dilaudid  1mg  IVP Q4H PRN severe breakthrough pain - Continue Lidoderm  patch (12 hours on 12 hours off) - Continue Methocarbamol  1g IV Q6H PRN - Continue gabapentin  100mg  PO at bedtime - Appreciate IR evaluation - MRI of spine today per Dr. Maria Shiner   Constipation: - Senna 2 Tabs PO BID - Miralax  17g Daily - Bisacodyl   suppository 10mg  PR Qday PRN   Anxiety/Depression: - Continue Paxil   ______________________________________________________________________________________ [facility] [place] Palliative Medicine Team Team Cell Phone: [phone] Please utilize secure chat with additional questions, if there is no response within 30 minutes please call the above phone number  Billing based on MDM: High in the setting of review and modification of parenteral opioids   Palliative Medicine Team providers are available by phone from 7am to 7pm daily and can be reached through the team cell phone.  Should this patient require assistance outside of these hours, please call the patient's attending physician.

## 2023-07-04 NOTE — Progress Notes (Signed)
 PROGRESS NOTE    John [name]  WUJ:[id number] DOB: [birth year] DOA: 07/02/2023 PCP: Patient, No Pcp Per     Brief Narrative:  John [name] is a 63 y.o. 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  Patient reports she has had back pain for months but the pain was too severe today to tolerate.  His home Percocet is not controlling his pain.  He describes the pain as sharp in his low back, radiating to both legs. He also reports difficulty breathing today.    On EMS arrival, patient was found to have SpO2 of 88%.  He did require up to 15 L high flow oxygen.  He was started on empiric IV antibiotics to cover HCAP.  New events last 24 hours / Subjective: Pain was much better yesterday and overnight, however there were some issues with his Dilaudid  ordered this morning and now having more pain.  Did have a bowel movement yesterday.  Gets short of breath especially with spike in pain.  Assessment & Plan:   Principal Problem:   Acute on chronic respiratory failure with hypoxia (HCC) Active Problems:   Hypokalemia   Hyponatremia   Intractable back pain   Pain of metastatic malignancy   HCAP (healthcare-associated pneumonia)   Acute on chronic hypoxic respiratory failure, with HCAP, history of pulmonary fibrosis, COPD, stage IV lung cancer - Uses 3 L nasal cannula O2 at baseline.  He desaturated to 83% and required up to 15 L high flow nasal cannula.  Currently on 4 L nasal cannula O2 - MRSA PCR negative - Strep pneumo antigen negative - Legionella antigen pending - COVID, RSV, influenza, respiratory viral panel  negative  - Empiric cefepime   Intractable back pain - Appreciate palliative care medicine for symptom management.  Started fentanyl  patch, Dilaudid , Lidoderm  patch, Robaxin , gabapentin  - CT thoracic spine revealed chronic compression deformities T10-L2.  He did have kyphoplasty of T12 back in July 2023, and kyphoplasty L1 and L3 in August 2023 - Consult IR for consideration for kyphoplasty per family request - MRI thoracic and lumbar spine ordered by Dr. Maria Shiner  Stage IV lung cancer - Currently under active therapy with  Gemzar  and Navelbine  - Followed by Dr. Maria Shiner  Hypovolemic hyponatremia, SIADH  - Urine osmole 575  - Urine sodium 129 - Salt tabs and fluid restriction  Paroxysmal A-fib - Toprol , amiodarone , Eliquis   Generalized weakness - PT OT  Constipation - Resolved.  Continue bowel regimen  AAA - Stable 3.3 cm infrarenal abdominal aortic aneurysm. Recommend follow-up ultrasound every 3 years.   DVT prophylaxis:   apixaban  (ELIQUIS ) tablet 5 mg  Code Status: Full code Family Communication: At bedside Disposition Plan: Home Status is: Inpatient Remains inpatient appropriate because: IV antibiotics, further pain control.  IR consult    Antimicrobials:  Anti-infectives (From admission, onward)    Start     Dose/Rate Route Frequency Ordered Stop   07/03/23 0800  vancomycin  (VANCOCIN ) IVPB 1000 mg/200 mL premix  Status:  Discontinued        1,000 mg 200 mL/hr over 60 Minutes Intravenous Every 12 hours 07/02/23 2146 07/03/23 0722   07/03/23 0200  ceFEPIme  (MAXIPIME ) 2 g  in sodium chloride  0.9 % 100 mL IVPB        2 g 200 mL/hr over 30 Minutes Intravenous Every 8 hours 07/02/23 2141     07/02/23 1845  vancomycin  (VANCOREADY) IVPB 2000 mg/400 mL        2,000 mg 200 mL/hr over 120 Minutes Intravenous  Once 07/02/23 1813 07/02/23 2120   07/02/23 1815  vancomycin  (VANCOCIN ) IVPB 1000 mg/200 mL premix  Status:  Discontinued        1,000 mg 200 mL/hr over 60  Minutes Intravenous  Once 07/02/23 1807 07/02/23 1813   07/02/23 1815  ceFEPIme  (MAXIPIME ) 2 g in sodium chloride  0.9 % 100 mL IVPB        2 g 200 mL/hr over 30 Minutes Intravenous  Once 07/02/23 1807 07/02/23 1911        Objective: Vitals:   07/03/23 1934 07/03/23 2100 07/04/23 0448 07/04/23 0813  BP:  101/67 106/68   Pulse:  90 89 98  Resp:  18 18 (!) 22  Temp:  97.8 F (36.6 C) 97.8 F (36.6 C)   TempSrc:      SpO2: 92% 92% 93% 92%  Weight:      Height:        Intake/Output Summary (Last 24 hours) at 07/04/2023 1015 Last data filed at 07/04/2023 0910 Gross per 24 hour  Intake 717 ml  Output 2375 ml  Net -1658 ml   Filed Weights   07/03/23 0155  Weight: 106.1 kg    Examination:  General exam: Appears calm, weak Respiratory system: CTAB, respiratory effort is normal Cardiovascular system: S1 & S2 heard, RRR. No murmurs. No pedal edema. Gastrointestinal system: Abdomen is nondistended, soft and nontender. Normal bowel sounds heard. Central nervous system: Alert and oriented. No focal neurological deficits. Speech clear.  Extremities: Symmetric in appearance  Skin: No rashes, lesions or ulcers on exposed skin  Psychiatry: Judgement and insight appear normal. Mood & affect appropriate.   Data Reviewed: I have personally reviewed following labs and imaging studies  CBC: Recent Labs  Lab 07/01/23 0814 07/02/23 1423 07/03/23 0500 07/04/23 0331  WBC 4.3 1.3* 1.5* 1.0*  NEUTROABS 3.9 0.6*  --   --   HGB 12.6* 11.3* 11.4* 11.4*  HCT 36.8* 33.3* 32.8* 31.8*  MCV 88.0 89.3 87.9 88.1  PLT 259 175 211 188   Basic Metabolic Panel: Recent Labs  Lab 07/01/23 0814 07/02/23 1423 07/02/23 1907 07/02/23 2330 07/03/23 0500 07/04/23 0331  NA 132* 124*  --  122* 123* 124*  K 4.2 3.3*  --   --  3.8 3.5  CL 97* 91*  --   --  91* 95*  CO2 24 24  --   --  22 19*  GLUCOSE 176* 103*  --   --  121* 105*  BUN 21 22  --   --  17 14  CREATININE 1.04 0.87  --   --  0.83 0.78   CALCIUM  8.9 8.4*  --   --  7.8* 8.1*  MG  --   --  1.8  --   --   --   PHOS  --   --  2.4*  --  4.0  --    GFR: Estimated Creatinine Clearance: 120.1 mL/min (by C-G formula based on SCr of 0.78 mg/dL). Liver Function Tests: Recent Labs  Lab 07/01/23 0814 07/02/23 1423 07/03/23 0500  AST 42* 28 29  ALT 55* 50* 48*  ALKPHOS 84 71 67  BILITOT  1.1 1.6* 1.3*  PROT 6.5 5.8* 5.5*  ALBUMIN  4.3 3.3* 3.3*   No results for input(s): "LIPASE", "AMYLASE" in the last 168 hours. No results for input(s): "AMMONIA" in the last 168 hours. Coagulation Profile: No results for input(s): "INR", "PROTIME" in the last 168 hours. Cardiac Enzymes: No results for input(s): "CKTOTAL", "CKMB", "CKMBINDEX", "TROPONINI" in the last 168 hours. BNP (last 3 results) No results for input(s): "PROBNP" in the last 8760 hours. HbA1C: No results for input(s): "HGBA1C" in the last 72 hours. CBG: No results for input(s): "GLUCAP" in the last 168 hours. Lipid Profile: No results for input(s): "CHOL", "HDL", "LDLCALC", "TRIG", "CHOLHDL", "LDLDIRECT" in the last 72 hours. Thyroid  Function Tests: Recent Labs    07/02/23 1947 07/02/23 2000  TSH 1.306  --   FREET4  --  1.79*   Anemia Panel: No results for input(s): "VITAMINB12", "FOLATE", "FERRITIN", "TIBC", "IRON", "RETICCTPCT" in the last 72 hours. Sepsis Labs: Recent Labs  Lab 07/02/23 1907  PROCALCITON 0.11    Recent Results (from the past 240 hours)  Culture, blood (Routine X 2) w Reflex to ID Panel     Status: None (Preliminary result)   Collection Time: 07/02/23 10:15 PM   Specimen: BLOOD RIGHT ARM  Result Value Ref Range Status   Specimen Description   Final    BLOOD RIGHT ARM Performed at [facility], [address], [place], [place] [address]    Special Requests   Final    BOTTLES DRAWN AEROBIC AND ANAEROBIC Blood Culture results may not be optimal due to an inadequate volume of blood received in culture bottles Performed at [facility], [address], [place], [place] [address]    Culture   Final    NO GROWTH 2 DAYS Performed at [facility], [address] [place], [place] [address]    Report Status PENDING  Incomplete  Culture, blood (Routine X 2) w Reflex to ID Panel     Status: None (Preliminary result)   Collection Time: 07/02/23 10:15 PM   Specimen: BLOOD RIGHT ARM  Result Value Ref Range Status   Specimen Description   Final    BLOOD RIGHT ARM Performed at [facility], [address] [place], [place] [address]    Special Requests   Final    BOTTLES DRAWN AEROBIC AND ANAEROBIC Blood Culture results may not be optimal due to an inadequate volume of blood received in culture bottles Performed at [facility], [address] [place], [place] [address]    Culture   Final    NO GROWTH 2 DAYS Performed at [facility], [address], [place], [place] [address]    Report Status PENDING  Incomplete  MRSA Next Gen by PCR, Nasal     Status: None   Collection Time: 07/02/23 11:57 PM   Specimen: Urine, Clean Catch; Nasal Swab  Result Value Ref Range Status   MRSA by PCR Next Gen NOT DETECTED NOT DETECTED Final    Comment: (NOTE) The GeneXpert MRSA Assay (FDA approved for NASAL specimens only), is one component of a comprehensive MRSA colonization surveillance program. It is not intended to diagnose MRSA infection nor to guide or monitor treatment for MRSA infections. Test performance is not FDA approved in patients less than 80 years old. Performed at [facility], [address], [place], [place] [address]   Resp panel by RT-PCR (RSV, Flu A&B, Covid) Anterior Nasal  Swab     Status: None   Collection Time: 07/03/23  4:56 PM   Specimen: Anterior Nasal Swab  Result Value Ref Range Status   SARS Coronavirus 2 by RT PCR NEGATIVE NEGATIVE Final    Comment: (NOTE) SARS-CoV-2 target nucleic acids are NOT DETECTED.  The SARS-CoV-2  RNA is generally detectable in upper respiratory specimens during the acute phase of infection. The lowest concentration of SARS-CoV-2 viral copies this assay can detect is 138 copies/mL. A negative result does not preclude SARS-Cov-2 infection and should not be used as the sole basis for treatment or other patient management decisions. A negative result may occur with  improper specimen collection/handling, submission of specimen other than nasopharyngeal swab, presence of viral mutation(s) within the areas targeted by this assay, and inadequate number of viral copies(<138 copies/mL). A negative result must be combined with clinical observations, patient history, and epidemiological information. The expected result is Negative.  Fact Sheet for Patients:  BloggerCourse.com  Fact Sheet for Healthcare Providers:  SeriousBroker.it  This test is no t yet approved or cleared by the [place]  FDA and  has been authorized for detection and/or diagnosis of SARS-CoV-2 by FDA under an Emergency Use Authorization (EUA). This EUA will remain  in effect (meaning this test can be used) for the duration of the COVID-19 declaration under Section 564(b)(1) of the Act, 21 U.S.C.section 360bbb-3(b)(1), unless the authorization is terminated  or revoked sooner.       Influenza A by PCR NEGATIVE NEGATIVE Final   Influenza B by PCR NEGATIVE NEGATIVE Final    Comment: (NOTE) The Xpert Xpress SARS-CoV-2/FLU/RSV plus assay is intended as an aid in the diagnosis of influenza from Nasopharyngeal swab specimens and should not be used as a sole basis for treatment. Nasal washings and aspirates are unacceptable for Xpert Xpress SARS-CoV-2/FLU/RSV testing.  Fact Sheet for Patients: BloggerCourse.com  Fact Sheet for Healthcare Providers: SeriousBroker.it  This test is not yet approved or cleared by the  [place]  FDA and has been authorized for detection and/or diagnosis of SARS-CoV-2 by FDA under an Emergency Use Authorization (EUA). This EUA will remain in effect (meaning this test can be used) for the duration of the COVID-19 declaration under Section 564(b)(1) of the Act, 21 U.S.C. section 360bbb-3(b)(1), unless the authorization is terminated or revoked.     Resp Syncytial Virus by PCR NEGATIVE NEGATIVE Final    Comment: (NOTE) Fact Sheet for Patients: BloggerCourse.com  Fact Sheet for Healthcare Providers: SeriousBroker.it  This test is not yet approved or cleared by the [place]  FDA and has been authorized for detection and/or diagnosis of SARS-CoV-2 by FDA under an Emergency Use Authorization (EUA). This EUA will remain in effect (meaning this test can be used) for the duration of the COVID-19 declaration under Section 564(b)(1) of the Act, 21 U.S.C. section 360bbb-3(b)(1), unless the authorization is terminated or revoked.  Performed at [facility], [address] [place], [place] [address]   Respiratory (~20 pathogens) panel by PCR     Status: None   Collection Time: 07/03/23  4:56 PM   Specimen: Nasopharyngeal Swab; Respiratory  Result Value Ref Range Status   Adenovirus NOT DETECTED NOT DETECTED Final   Coronavirus 229E NOT DETECTED NOT DETECTED Final    Comment: (NOTE) The Coronavirus on the Respiratory Panel, DOES NOT test for the novel  Coronavirus (2019 nCoV)    Coronavirus HKU1 NOT DETECTED NOT DETECTED Final   Coronavirus NL63 NOT DETECTED NOT DETECTED Final  Coronavirus OC43 NOT DETECTED NOT DETECTED Final   Metapneumovirus NOT DETECTED NOT DETECTED Final   Rhinovirus / Enterovirus NOT DETECTED NOT DETECTED Final   Influenza A NOT DETECTED NOT DETECTED Final   Influenza B NOT DETECTED NOT DETECTED Final   Parainfluenza Virus 1 NOT DETECTED NOT DETECTED Final    Parainfluenza Virus 2 NOT DETECTED NOT DETECTED Final   Parainfluenza Virus 3 NOT DETECTED NOT DETECTED Final   Parainfluenza Virus 4 NOT DETECTED NOT DETECTED Final   Respiratory Syncytial Virus NOT DETECTED NOT DETECTED Final   Bordetella pertussis NOT DETECTED NOT DETECTED Final   Bordetella Parapertussis NOT DETECTED NOT DETECTED Final   Chlamydophila pneumoniae NOT DETECTED NOT DETECTED Final   Mycoplasma pneumoniae NOT DETECTED NOT DETECTED Final    Comment: Performed at [facility], [address] [place], [place] [address]      Radiology Studies: DG HIP UNILAT WITH PELVIS 2-3 VIEWS LEFT Result Date: 07/04/2023 CLINICAL DATA:  Left hip pain. EXAM: DG HIP (WITH OR WITHOUT PELVIS) 2-3V LEFT COMPARISON:  KUB 07/03/2023, CT chest, abdomen, and pelvis 06/23/2023 FINDINGS: There is diffuse decreased bone mineralization. The bilateral femoroacetabular joint spaces are maintained. Mild bilateral superolateral acetabular degenerative osteophytosis. The bilateral sacroiliac and pubic symphysis joint spaces are maintained. No acute fracture or dislocation. L3 vertebral body and partially visualized L1 vertebral body augmentation cement again noted. Mild to moderate atherosclerotic calcifications. IMPRESSION: Mild bilateral femoroacetabular osteoarthritis. Electronically Signed   By: Bertina Broccoli M.D.   On: 07/04/2023 09:23   DG Abd 1 View Result Date: 07/03/2023 CLINICAL DATA:  Constipation. Patient reports left bowel movement was 5 days ago. EXAM: ABDOMEN - 1 VIEW COMPARISON:  CT 06/23/2023 FINDINGS: Diminished stool burden from prior CT. No significant formed stool throughout the colon. There is scattered air throughout nondilated small bowel in the central abdomen. Cholecystectomy clips in the right upper quadrant. Excreted IV contrast in the urinary bladder from yesterday's chest CTA. Multilevel vertebral augmentation. IMPRESSION: 1. No significant formed stool in the colon. 2. Air  scattered throughout nondilated small bowel, query ileus. Electronically Signed   By: Chadwick Colonel M.D.   On: 07/03/2023 14:55   CT Angio Chest PE W and/or Wo Contrast Result Date: 07/02/2023 CLINICAL DATA:  Right-sided chest pain. EXAM: CT ANGIOGRAPHY CHEST WITH CONTRAST TECHNIQUE: Multidetector CT imaging of the chest was performed using the standard protocol during bolus administration of intravenous contrast. Multiplanar CT image reconstructions and MIPs were obtained to evaluate the vascular anatomy. RADIATION DOSE REDUCTION: This exam was performed according to the departmental dose-optimization program which includes automated exposure control, adjustment of the mA and/or kV according to patient size and/or use of iterative reconstruction technique. CONTRAST:  75mL OMNIPAQUE  IOHEXOL  350 MG/ML SOLN COMPARISON:  CT angiogram chest abdomen and pelvis 06/23/2023. FINDINGS: Cardiovascular: Satisfactory opacification of the pulmonary arteries to the segmental level. No evidence of pulmonary embolism. Normal heart size. No pericardial effusion. Mediastinum/Nodes: No enlarged mediastinal, hilar, or axillary lymph nodes. Thyroid  gland, trachea, and esophagus demonstrate no significant findings. Lungs/Pleura: Mild emphysema again noted in the lung apices. Multifocal areas of ground-glass opacity are again seen throughout both lungs along with some peripheral reticular opacities. Fibrotic changes are again noted in the right lung base. There is some focal airspace opacity in the right perihilar region and superior segment of the right lower lobe with some central cavitation. There is no pleural effusion or pneumothorax. Compared to 06/23/2023 there has been no significant interval change. Upper Abdomen:  No acute abnormality. Cholecystectomy clips are present. Infrarenal abdominal aortic aneurysm measures up to 3.3 cm and appears unchanged. Musculoskeletal: No acute osseous findings. Review of the MIP images  confirms the above findings. IMPRESSION: 1. No evidence for pulmonary embolism. 2. Stable multifocal areas of ground-glass opacity throughout both lungs along with some peripheral reticular opacities. Findings are favored as infectious/inflammatory. 3. Stable focal airspace opacity in the right perihilar region and superior segment of the right lower lobe with some central cavitation. Findings may be infectious/inflammatory. 4. Stable 3.3 cm infrarenal abdominal aortic aneurysm. Recommend follow-up ultrasound every 3 years. Aortic Atherosclerosis (ICD10-I70.0) and Emphysema (ICD10-J43.9). Electronically Signed   By: Tyron Gallon M.D.   On: 07/02/2023 17:48   CT T-SPINE NO CHARGE Result Date: 07/02/2023 CLINICAL DATA:  Lower back and bilateral lower extremity pain, stage IV lung cancer EXAM: CT Thoracic Spine with contrast TECHNIQUE: Multiplanar CT images of the thoracic spine were reconstructed from contemporary CT of the Chest. RADIATION DOSE REDUCTION: This exam was performed according to the departmental dose-optimization program which includes automated exposure control, adjustment of the mA and/or kV according to patient size and/or use of iterative reconstruction technique. CONTRAST:  No additional COMPARISON:  06/23/2023 FINDINGS: Alignment: Alignment is grossly anatomic. Vertebrae: Stable compression deformities at T10, T11, T12, L1, and L2. Prior vertebral augmentations seen at T12 and L1. There are no acute displaced fractures. No destructive bony abnormalities. Paraspinal and other soft tissues: Paraspinal soft tissues are unremarkable. Continue consolidation cavitation within the right lower lobe. Please see separate CT chest report for findings in that region. Disc levels: There is no significant bony encroachment upon the central canal or neural foramina. Reconstructed images demonstrate no additional findings. IMPRESSION: 1. Chronic compression deformities from T10 through L2, with evidence of  prior vertebral augmentation at T12 and L1. No acute thoracic spine fracture. 2. Please refer to separate CT chest report for findings in that region. Electronically Signed   By: Bobbye Burrow M.D.   On: 07/02/2023 17:47   DG Chest Port 1 View Result Date: 07/02/2023 CLINICAL DATA:  Lower back and bilateral lower extremity pain, stage IV lung cancer EXAM: PORTABLE CHEST 1 VIEW COMPARISON:  06/23/2023 FINDINGS: Single frontal view of the chest demonstrates a stable cardiac silhouette. Fibrotic changes in the right lung are stable, consistent with post treatment appearance in this patient with history of lung cancer. There is progressive consolidation at the right lung base which may reflect new airspace disease or atelectasis. No effusion or pneumothorax. Left chest is clear. No acute bony abnormalities. IMPRESSION: 1. Increasing right basilar consolidation superimposed upon chronic post radiation fibrosis, which may reflect acute airspace disease or atelectasis. Electronically Signed   By: Bobbye Burrow M.D.   On: 07/02/2023 14:48      Scheduled Meds:  amiodarone   200 mg Oral Daily   apixaban   5 mg Oral BID   budesonide -glycopyrrolate -formoterol   2 puff Inhalation BID   feeding supplement  237 mL Oral TID BM   fentaNYL   1 patch Transdermal Q72H   filgrastim  (NIVESTYM ) SQ  480 mcg Subcutaneous Daily   gabapentin   100 mg Oral QHS   HYDROmorphone   2 mg Oral TID   ketorolac   30 mg Intravenous Q8H   lidocaine   1 patch Transdermal Q24H   metoprolol  succinate  25 mg Oral Daily   PARoxetine   10 mg Oral QHS   polyethylene glycol  17 g Oral Daily   predniSONE   5 mg Oral Q breakfast  senna-docusate  2 tablet Oral BID   Continuous Infusions:  ceFEPime  (MAXIPIME ) IV 2 g (07/04/23 0951)     LOS: 2 days   Time spent: 35 minutes   Daren Eck, DO [facility] 07/04/2023, 10:15 AM   Available via Epic secure chat 7am-7pm After these hours, please refer to coverage provider listed on  [email or web address]

## 2023-07-04 NOTE — Consult Note (Signed)
 Mr. Ngu is well-known to me.  He is a very nice 63 year old white male.  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  His problem in the recent past has been osteoporotic compression fractures of the back.  I think he has had kyphoplasty for these.  Currently, he is on Gemzar /Navelbine .  I think his last dose was probably 2 weeks ago.  Has been having more more in the way of back pain.  He has decreased function and mobility of the left hip.  We have in the office last week.  We gave him some IV fluids and IV pain medication.  This did seem to help temporarily.  However, over the weekend, he came back in because of the back discomfort.  He had a CT angiogram of the chest that was done.  This was done on 07/03/2023.  This did not show any evidence of pulmonary embolism.  He has a stable multifocal areas of groundglass opacity throughout both lung fields.  He has stable focal airspace opacity in the right lower lung.  When he came into, his sodium was 124.  Potassium 3.3.  BUN 22 creatinine 0.87.  Calcium  8.4.  Albumin  3.3.  His white cell count was 1.3.  Hemoglobin 9.3.  Platelet count 175,000.  He is on a Duragesic  patch along with oral hydromorphone .  I do know he has a additional compression fractures in the back.  I do not know if this might be malignancy related.  I do think that he is going to need to have a MRI of the back to see what is going on.  He does have the heart trouble.  He does have a history of atrial fibrillation.  He is on amiodarone .  He has had no fever.  There is been no obvious bleeding.  He has had no diarrhea.  There may be a little bit of constipation.  His vital signs show temperature 97.8.  Pulse 89.  Blood pressure 116/68.  Head and neck exam shows no ocular or oral lesions.  There are no palpable cervical or supraclavicular lymph nodes.  Lungs are relatively clear bilaterally.  He may have some wheezing  bilaterally.  Cardiac exam irregular rate and rhythm consistent with the atrial fibrillation.  He has no murmurs.  Abdomen is soft.  Bowel sounds are present.  He has no fluid wave.  There is no palpable liver or spleen tip.  Extremity shows no clubbing, cyanosis or edema.  He has decreased range of motion of the left hip.  Neurological exam shows no obvious neurological deficits.    Mr. Dani Dupont is a 63 year old gentleman.  He has metastatic squamous cell carcinoma of the lung.  We have on chemotherapy.  He had a CT angiogram which did not show anything that was obvious with the cancer.  Again I do think he is a good need to have an MRI.  I am unsure exactly what the source of all this pain might be.  I think MRI could help us  out.  I do not know if this may be another compression fracture.  His left hip certainly has problems.  He has decreased strength and mobility of the left hip.  He is bargaining to have some PT/OT help.  He is neutropenic.  We will have to be careful with this.  I will try him on some Neupogen  to try to help get his white cell count up.  He may have a little bit of pneumonia in the right lung.  I think he is on Maxipime  right now.  We will follow along.  I know he will get incredible care for everybody on [address].  Rayleen Cal, MD  Hebrews 12:12

## 2023-07-05 DIAGNOSIS — F419 Anxiety disorder, unspecified: Secondary | ICD-10-CM

## 2023-07-05 DIAGNOSIS — J9621 Acute and chronic respiratory failure with hypoxia: Secondary | ICD-10-CM | POA: Diagnosis not present

## 2023-07-05 DIAGNOSIS — Z7189 Other specified counseling: Secondary | ICD-10-CM | POA: Diagnosis not present

## 2023-07-05 DIAGNOSIS — S22080S Wedge compression fracture of T11-T12 vertebra, sequela: Secondary | ICD-10-CM | POA: Diagnosis not present

## 2023-07-05 DIAGNOSIS — G893 Neoplasm related pain (acute) (chronic): Secondary | ICD-10-CM | POA: Diagnosis not present

## 2023-07-05 DIAGNOSIS — Z515 Encounter for palliative care: Secondary | ICD-10-CM | POA: Diagnosis not present

## 2023-07-05 LAB — CBC
HCT: 33.4 % — ABNORMAL LOW (ref 39.0–52.0)
Hemoglobin: 11.2 g/dL — ABNORMAL LOW (ref 13.0–17.0)
MCH: 31 pg (ref 26.0–34.0)
MCHC: 33.5 g/dL (ref 30.0–36.0)
MCV: 92.5 fL (ref 80.0–100.0)
Platelets: 205 10*3/uL (ref 150–400)
RBC: 3.61 MIL/uL — ABNORMAL LOW (ref 4.22–5.81)
RDW: 19.2 % — ABNORMAL HIGH (ref 11.5–15.5)
WBC: 1 10*3/uL — CL (ref 4.0–10.5)
nRBC: 0 % (ref 0.0–0.2)

## 2023-07-05 LAB — BASIC METABOLIC PANEL WITH GFR
Anion gap: 8 (ref 5–15)
BUN: 17 mg/dL (ref 8–23)
CO2: 20 mmol/L — ABNORMAL LOW (ref 22–32)
Calcium: 8.1 mg/dL — ABNORMAL LOW (ref 8.9–10.3)
Chloride: 103 mmol/L (ref 98–111)
Creatinine, Ser: 0.9 mg/dL (ref 0.61–1.24)
GFR, Estimated: >60 mL/min (ref >60)
Glucose, Bld: 107 mg/dL — ABNORMAL HIGH (ref 70–99)
Potassium: 3.7 mmol/L (ref 3.5–5.1)
Sodium: 131 mmol/L — ABNORMAL LOW (ref 135–145)

## 2023-07-05 LAB — LEGIONELLA PNEUMOPHILA SEROGP 1 UR AG: L. pneumophila Serogp 1 Ur Ag: NEGATIVE

## 2023-07-05 LAB — HEPARIN LEVEL (UNFRACTIONATED): Heparin Unfractionated: >1.1 [IU]/mL — ABNORMAL HIGH (ref 0.30–0.70)

## 2023-07-05 LAB — VITAMIN D 25 HYDROXY (VIT D DEFICIENCY, FRACTURES): Vit D, 25-Hydroxy: 19.65 ng/mL — ABNORMAL LOW (ref 30–100)

## 2023-07-05 LAB — APTT: aPTT: 76 s — ABNORMAL HIGH (ref 24–36)

## 2023-07-05 MED ORDER — ZOLEDRONIC ACID 4 MG/100ML IV SOLN
4.0000 mg | Freq: Once | INTRAVENOUS | Status: AC
  Administered 2023-07-05: 4 mg via INTRAVENOUS
  Filled 2023-07-05: qty 100

## 2023-07-05 MED ORDER — ZOLEDRONIC ACID 4 MG/100ML IV SOLN
4.0000 mg | Freq: Once | INTRAVENOUS | Status: DC
Start: 2023-07-05 — End: 2023-07-05
  Filled 2023-07-05: qty 100

## 2023-07-05 MED ORDER — SODIUM CHLORIDE 0.9 % IV SOLN: INTRAVENOUS | Status: AC | PRN

## 2023-07-05 MED ORDER — HEPARIN (PORCINE) 25000 UT/250ML-% IV SOLN
1400.0000 [IU]/h | INTRAVENOUS | Status: DC
  Administered 2023-07-05 – 2023-07-07 (×4): 1400 [IU]/h via INTRAVENOUS
  Filled 2023-07-05 (×4): qty 250

## 2023-07-05 MED ORDER — CEFAZOLIN SODIUM-DEXTROSE 2-4 GM/100ML-% IV SOLN
2.0000 g | INTRAVENOUS | Status: AC
  Administered 2023-07-07: 2 g via INTRAVENOUS
  Filled 2023-07-05: qty 100

## 2023-07-05 MED ORDER — HYDROMORPHONE HCL 2 MG PO TABS
2.0000 mg | ORAL_TABLET | ORAL | Status: DC
  Administered 2023-07-05 – 2023-07-09 (×23): 2 mg via ORAL
  Filled 2023-07-05 (×23): qty 1

## 2023-07-05 NOTE — Progress Notes (Signed)
 He still have a lot of pain.  I think the MRI shows us  the problem.  He has osteoporotic compression fractures.  Has a new 1 at L2 with 60% of vertebral height.  I think this is where his pain is coming from.  Will see if IR can do vertebroplasty on him.  And happened before and it worked very well for him.  The left hip x-ray does not show any metastatic disease.  He has osteoarthritis.  He is on Eliquis .  We we will have to get him off the Eliquis  again we will get him on heparin  if he can do a vertebroplasty.  Again, this is all about quality of life for him.  There are no pathologic fractures from malignancy.  I am happy about this.  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  His labs show white cell count of 1 hemoglobin 11.2.  Platelet count 205,000.  He will not take Neupogen  because he says this causes way too much bony pain when he gets it.  He is not able to really get out of bed.  He is eating a little bit.  He has had no nausea or vomiting.  He has had no fever.  He has had no cough.  There has been no bleeding.  Sodium 131.  Potassium 3.7.  BUN 17 creatinine 0.9.  Calcium  8.1.  His vital signs are temperature 97.6.  Pulse 92.  Blood pressure 96/67.  His head and exam shows no ocular or oral lesions.  He has no palpable cervical or supraclavicular lymph nodes.  Lungs are clear bilaterally.  Cardiac exam is irregular rate and irregular rhythm consistent with atrial fibrillation.  Abdomen is soft.  Bowel sounds are present.  Extremities still shows some decreased range of motion of the hip on the left side.  Neurological exam is nonfocal.  Again, I think the problem is the L2 compression fracture.  Will see if IR will be able to help this.  Again, he is on Eliquis .  I will get him off Eliquis  and get him on heparin  so we can stop his easily.  Hopefully, we will be able to get this in the next couple days or so.  Rayleen Cal, MD  Hebrews 12:12

## 2023-07-05 NOTE — Progress Notes (Signed)
 OT Cancellation Note  Patient Details Name: John [name] MRN: [id number] DOB: 30-Aug-1960   Cancelled Treatment:    Reason Eval/Treat Not Completed: Pain limiting ability to participate Patient is pending IR consult at this time for possible intervention with new L2 compression fx noted in MRI on 5/19. OT to continue to follow and check back as schedule will allow.   Wynette Heckler, MS Acute Rehabilitation Department Office# [phone]  07/05/2023, 10:00 AM

## 2023-07-05 NOTE — Progress Notes (Signed)
 PHARMACY - ANTICOAGULATION CONSULT NOTE  Pharmacy Consult for Eliquis  >> heparin  Indication: atrial fibrillation  Allergies  Allergen Reactions   Ventolin  [Albuterol ] Other (See Comments)    Patient has had episode of atrial fib following administration of albuterol  (tolerates Xopenex  well)   Lipitor Comet.Commons ] Other (See Comments)    Arthralgias    Oxycodone  Other (See Comments)    Severe Migranes    Patient Measurements: Height: 5\' 11"  (180.3 cm) Weight: 106.1 kg (234 lb) IBW/kg (Calculated) : 75.3 HEPARIN  DW (KG): 97.7  Vital Signs: Temp: 97.6 F (36.4 C) (05/20 0410) Temp Source: Oral (05/20 0410) BP: 96/67 (05/20 0410) Pulse Rate: 92 (05/20 0410)  Labs: Recent Labs    07/03/23 0500 07/04/23 0331 07/04/23 1318 07/05/23 0428  HGB 11.4* 11.4*  --  11.2*  HCT 32.8* 31.8*  --  33.4*  PLT 211 188  --  205  LABPROT  --   --  17.0*  --   INR  --   --  1.4*  --   CREATININE 0.83 0.78  --  0.90    Estimated Creatinine Clearance: 106.8 mL/min (by C-G formula based on SCr of 0.9 mg/dL).   Medical History: Past Medical History:  Diagnosis Date   Arthritis    Rheumatoid   Bacteremia due to Pseudomonas    C. difficile colitis    Chronic anxiety 11/08/2014   Cough 09/22/2021   COVID 2020   mild case   Depression    Drug-induced neutropenia (HCC) 08/11/2020   Family history of adverse reaction to anesthesia    brother with seizures had episode under anesthesia.  Patient has seizures as well.   GERD (gastroesophageal reflux disease)    History of radiation therapy 04/24/2020-05/16/2020   IMRT to right lung     Dr Retta Caster   History of radiation therapy    08/10/21-08/19/21-Dr. Retta Caster   History of radiation therapy    Abdomen- 03/21/23-03/31/23-Dr. Retta Caster   Neutropenia [facility]) 05/12/2020   Neutropenic fever (HCC) 05/12/2020   Normocytic anemia 04/18/2021   PAF (paroxysmal atrial fibrillation) (HCC)    CHADS2VSAC score 3   Rheumatoid aortitis     Secondary hypercoagulable state (HCC) 10/28/2020   Sepsis (HCC) 10/06/2020   Shortness of breath 12/18/2020   Squamous cell lung cancer (HCC)     Medications:  Medications Prior to Admission  Medication Sig Dispense Refill Last Dose/Taking   acetaminophen  (TYLENOL ) 500 MG tablet Take 500 mg by mouth daily as needed for moderate pain (pain score 4-6), fever or headache.   Past Week   amiodarone  (PACERONE ) 200 MG tablet START by taking 2 tablets (400mg ) daily for 2 weeks THEN reduce to 1 tablet (200mg ) daily (Patient taking differently: Take 200 mg by mouth daily.) 90 tablet 3 Past Week   benzonatate  (TESSALON ) 200 MG capsule Take 1 capsule (200 mg total) by mouth 3 (three) times daily as needed. 45 capsule 1 Past Week   ELIQUIS  5 MG TABS tablet TAKE 1 TABLET(5 MG) BY MOUTH TWICE DAILY (Patient taking differently: Take 5 mg by mouth 2 (two) times daily.) 60 tablet 5 07/02/2023 Evening   Evolocumab  (REPATHA  SURECLICK) 140 MG/ML SOAJ ADMINISTER 1 ML UNDER THE SKIN EVERY 14 DAYS 6 mL 3 Past Month   HYDROcodone  bit-homatropine (HYDROMET) 5-1.5 MG/5ML syrup Take 5 mLs by mouth every 6 (six) hours as needed for cough. 120 mL 0 Unknown   lidocaine  (LIDODERM ) 5 % Place 1 patch onto the skin daily. Remove & Discard  patch within 12 hours or as directed by MD 30 patch 4 Past Week   metoprolol  succinate (TOPROL -XL) 25 MG 24 hr tablet TAKE 1 TABLET(25 MG) BY MOUTH DAILY 90 tablet 3 07/02/2023   ondansetron  (ZOFRAN ) 8 MG tablet Take 1 tablet (8 mg total) by mouth every 8 (eight) hours as needed for nausea or vomiting. 20 tablet 3 Past Week   oxyCODONE -acetaminophen  (PERCOCET/ROXICET) 5-325 MG tablet Take 1-2 tablets by mouth every 6 (six) hours as needed. 90 tablet 0 Past Week   PARoxetine  (PAXIL ) 10 MG tablet Take 1 tablet (10 mg total) by mouth at bedtime. 90 tablet 2 07/02/2023   polyethylene glycol (MIRALAX  / GLYCOLAX ) 17 g packet Take 17 g by mouth daily as needed for mild constipation.   Unknown    predniSONE  (DELTASONE ) 5 MG tablet Take 1 tablet (5 mg total) by mouth daily with breakfast. 90 tablet 4 07/02/2023   prochlorperazine  (COMPAZINE ) 10 MG tablet Take 1 tablet (10 mg total) by mouth every 6 (six) hours as needed for nausea or vomiting. 30 tablet 3 07/02/2023   TRELEGY ELLIPTA  100-62.5-25 MCG/ACT AEPB INHALE 1 PUFF INTO THE LUNGS DAILY 60 each 5 07/02/2023   Assessment: 63 YO male presenting with worsening back pain and shortness of breath. MRI spine showed osteoporotic compression fractures. Oncology planning for possible vertebroplasty. Patient currently on PTA Eliquis  for atrial fibrillation--last dose was 5mg  on 5/19 PM. Pharmacy consulted for heparin  dosing, pending vertebroplasty procedure.   Today, 07/05/23: Hgb 11.2, plts 205--stable Scr <1 No s/sx of bleeding reported Last dose of Eliquis  5/19 PM Given recent administration of Eliquis , will NOT bolus heparin  and will monitor both heparin  level and aPTT until the two are correlating (DOACs can falsely elevate heparin  levels)  Goal of Therapy:  Heparin  level 0.3-0.7 units/ml aPTT 66-102 seconds Monitor platelets by anticoagulation protocol: Yes   Plan:  Start heparin  1400 units/hr today @1000  (when next dose of Eliquis  would have been given) Check heparin  level and aPTT 6hrs after heparin  infusion starts  Check heparin  level, aPTT, and CBC daily Monitor for s/sx of bleeding F/u plans for vertebroplasty    Roselee Cong, PharmD Clinical Pharmacist  5/20/20257:20 AM

## 2023-07-05 NOTE — Consult Note (Addendum)
 Chief Complaint: Patient was seen in consultation today for acute compression fracture of L 2 vertebra, with consideration for kyphoplasty.  Referring Provider(s): Dr. Gray Layman, MD   Supervising Physician: Luellen Sages  Patient Status: [facility] - In-pt  Patient is Full Code  History of Present Illness: John [name] is a 63 y.o. 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 John [name] is on 3 L O2 by nasal cannula at baseline.  Patient presented to ED on 5/17, complaining of worsening back pain and shortness of breath. Patient reports chronic back pain, but the pain that day was severe, not relieved by home percocet, and prompting his ED visit. John [name] describes the pain as sharp in his lower back, radiating to both legs.  Per Dr. Birt Bulla progress note today: "John [name] still have a lot of pain.  I think the MRI shows us  the problem.  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  Has a new 1 at L2 with 60% of vertebral height.  I think this is where his pain is coming from.   Will see if IR can do vertebroplasty on him.  And happened before and it worked very well for him.  [...] John [name] is on Eliquis .  We we will have to get him off the Eliquis  again we will get him on heparin  if John [name] can do a vertebroplasty.   Again, this is all about quality of life for him.   There are no pathologic fractures from malignancy.  I am happy about this.  John [name] is on long-term steroids because of the rheumatism."   Interventional Radiology was requested for L 2 kyphoplasty. Request was reviewed and approved by Dr. Alvira Josephs. Patient is tentatively scheduled for same in IR on Thursday 07/07/23.   Patient is alert and laying in bed, calm.  Patient is  currently with back pain from compression fracture.  Patient denies any fevers, headache, chest pain, SOB, cough, abdominal pain, nausea, vomiting or bleeding.     Past Medical History:  Diagnosis Date   Arthritis    Rheumatoid   Bacteremia due to Pseudomonas    C. difficile colitis    Chronic anxiety 11/08/2014   Cough 09/22/2021   COVID 2020   mild case   Depression    Drug-induced neutropenia (HCC) 08/11/2020   Family history of adverse reaction to anesthesia    brother with seizures had episode under anesthesia.  Patient has seizures as well.   GERD (gastroesophageal reflux disease)    History of radiation therapy 04/24/2020-05/16/2020   IMRT to right lung     Dr Retta Caster   History of radiation therapy    08/10/21-08/19/21-Dr. Retta Caster   History of radiation therapy    Abdomen- 03/21/23-03/31/23-Dr. Retta Caster   Neutropenia [facility]) 05/12/2020   Neutropenic fever (HCC) 05/12/2020   Normocytic anemia 04/18/2021   PAF (paroxysmal atrial fibrillation) (HCC)    CHADS2VSAC score 3   Rheumatoid aortitis    Secondary hypercoagulable state (HCC) 10/28/2020   Sepsis (HCC) 10/06/2020   Shortness of breath 12/18/2020   Squamous cell lung cancer [facility])     Past Surgical History:  Procedure Laterality Date   BRONCHIAL BRUSHINGS  03/27/2020   Procedure: BRONCHIAL BRUSHINGS;  Surgeon: Denson Flake, MD;  Location: [facility] ENDOSCOPY;  Service: Cardiopulmonary;;  BUBBLE STUDY  10/13/2020   Procedure: BUBBLE STUDY;  Surgeon: Hazle Lites, MD;  Location: [facility] ENDOSCOPY;  Service: Cardiovascular;;   CARDIOVERSION N/A 10/13/2020   Procedure: CARDIOVERSION;  Surgeon: Hazle Lites, MD;  Location: [facility] ENDOSCOPY;  Service: Cardiovascular;  Laterality: N/A;   CATARACT EXTRACTION  2016   at [facility]   CHOLECYSTECTOMY N/A 06/08/2023   Procedure: LAPAROSCOPIC CHOLECYSTECTOMY;  Surgeon: Junie Olds, MD;  Location: [facility] ORS;  Service: General;  Laterality: N/A;   FINE NEEDLE ASPIRATION   03/27/2020   Procedure: FINE NEEDLE ASPIRATION;  Surgeon: Denson Flake, MD;  Location: [facility] ENDOSCOPY;  Service: Cardiopulmonary;;   HIP SURGERY Left    IR KYPHO EA ADDL LEVEL THORACIC OR LUMBAR  10/12/2021   IR KYPHO LUMBAR INC FX REDUCE BONE BX UNI/BIL CANNULATION INC/IMAGING  10/12/2021   IR KYPHO THORACIC WITH BONE BIOPSY  09/07/2021   IR RADIOLOGIST EVAL & MGMT  09/03/2021   RADIOLOGY WITH ANESTHESIA N/A 10/12/2021   Procedure: L1 and L3 Kyphoplasty;  Surgeon: de Macedo Rodrigues, Katyucia, MD;  Location: [facility] OR;  Service: Radiology;  Laterality: N/A;   TEE WITHOUT CARDIOVERSION N/A 10/13/2020   Procedure: TRANSESOPHAGEAL ECHOCARDIOGRAM (TEE);  Surgeon: Hazle Lites, MD;  Location: [facility] ENDOSCOPY;  Service: Cardiovascular;  Laterality: N/A;   VIDEO BRONCHOSCOPY WITH ENDOBRONCHIAL ULTRASOUND N/A 03/27/2020   Procedure: VIDEO BRONCHOSCOPY WITH ENDOBRONCHIAL ULTRASOUND;  Surgeon: Denson Flake, MD;  Location: [facility] ENDOSCOPY;  Service: Cardiopulmonary;  Laterality: N/A;    Allergies: Ventolin  [albuterol ], Lipitor [atorvastatin ], and Oxycodone   Medications: Prior to Admission medications   Medication Sig Start Date End Date Taking? Authorizing Provider  acetaminophen  (TYLENOL ) 500 MG tablet Take 500 mg by mouth daily as needed for moderate pain (pain score 4-6), fever or headache.   Yes [provider]  amiodarone  (PACERONE ) 200 MG tablet START by taking 2 tablets (400mg ) daily for 2 weeks THEN reduce to 1 tablet (200mg ) daily Patient taking differently: Take 200 mg by mouth daily. 04/02/23  Yes Debbie Fails, PA-C  benzonatate  (TESSALON ) 200 MG capsule Take 1 capsule (200 mg total) by mouth 3 (three) times daily as needed. 06/02/23 06/01/24 Yes Parrett, Tammy S, NP  ELIQUIS  5 MG TABS tablet TAKE 1 TABLET(5 MG) BY MOUTH TWICE DAILY Patient taking differently: Take 5 mg by mouth 2 (two) times daily. 05/12/23  Yes Turner, Rufus Council, MD  Evolocumab  (REPATHA  SURECLICK) 140 MG/ML SOAJ  ADMINISTER 1 ML UNDER THE SKIN EVERY 14 DAYS 05/16/23  Yes Turner, Rufus Council, MD  HYDROcodone  bit-homatropine (HYDROMET) 5-1.5 MG/5ML syrup Take 5 mLs by mouth every 6 (six) hours as needed for cough. 06/28/23  Yes Parrett, Tammy S, NP  lidocaine  (LIDODERM ) 5 % Place 1 patch onto the skin daily. Remove & Discard patch within 12 hours or as directed by MD 04/29/23  Yes Ennever, Sherryll Genesis, MD  metoprolol  succinate (TOPROL -XL) 25 MG 24 hr tablet TAKE 1 TABLET(25 MG) BY MOUTH DAILY 05/18/23  Yes Turner, Rufus Council, MD  ondansetron  (ZOFRAN ) 8 MG tablet Take 1 tablet (8 mg total) by mouth every 8 (eight) hours as needed for nausea or vomiting. 05/26/23  Yes Ivor Mars, MD  oxyCODONE -acetaminophen  (PERCOCET/ROXICET) 5-325 MG tablet Take 1-2 tablets by mouth every 6 (six) hours as needed. 07/02/23  Yes Ivor Mars, MD  PARoxetine  (PAXIL ) 10 MG tablet Take 1 tablet (10 mg total) by mouth at bedtime. 01/11/23  Yes Dorthy Gavia, MD  polyethylene glycol (MIRALAX  / GLYCOLAX ) 17 g  packet Take 17 g by mouth daily as needed for mild constipation. 06/09/23  Yes Lonita Roach, MD  predniSONE  (DELTASONE ) 5 MG tablet Take 1 tablet (5 mg total) by mouth daily with breakfast. 08/18/22  Yes Ennever, Sherryll Hakan, MD  prochlorperazine  (COMPAZINE ) 10 MG tablet Take 1 tablet (10 mg total) by mouth every 6 (six) hours as needed for nausea or vomiting. 05/26/23  Yes Ivor Mars, MD  TRELEGY ELLIPTA  100-62.5-25 MCG/ACT AEPB INHALE 1 PUFF INTO THE LUNGS DAILY 06/20/23  Yes Denson Flake, MD     Family History  Problem Relation Age of Onset   Arrhythmia Mother        has PPM   Heart disease Father        Died at 76, started in his 33s, heart attacks, had PPM and ICD    Social History   Socioeconomic History   Marital status: Married    Spouse name: Not on file   Number of children: Not on file   Years of education: Not on file   Highest education level: Not on file  Occupational History   Not on file  Tobacco Use    Smoking status: Former    Current packs/day: 0.00    Average packs/day: 1 pack/day for 30.0 years (30.0 ttl pk-yrs)    Types: Cigarettes    Start date: 47    Quit date: 2018    Years since quitting: 7.3    Passive exposure: Past   Smokeless tobacco: Never   Tobacco comments:    Former smoker, quit 2018  Vaping Use   Vaping status: Never Used  Substance and Sexual Activity   Alcohol use: Not Currently   Drug use: Never   Sexual activity: Not Currently  Other Topics Concern   Not on file  Social History Narrative   Not on file   Social Drivers of Health   Financial Resource Strain: Not on file  Food Insecurity: No Food Insecurity (07/02/2023)   Hunger Vital Sign    Worried About Running Out of Food in the Last Year: Never true    Ran Out of Food in the Last Year: Never true  Transportation Needs: No Transportation Needs (07/02/2023)   PRAPARE - Administrator, Civil Service (Medical): No    Lack of Transportation (Non-Medical): No  Physical Activity: Not on file  Stress: Not on file  Social Connections: Moderately Integrated (03/16/2022)   Social Connection and Isolation Panel [NHANES]    Frequency of Communication with Friends and Family: More than three times a week    Frequency of Social Gatherings with Friends and Family: More than three times a week    Attends Religious Services: More than 4 times per year    Active Member of [employer] or Organizations: No    Attends Banker Meetings: Never    Marital Status: Married     Review of Systems: A 12 point ROS discussed and pertinent positives are indicated in the HPI above.  All other systems are negative.  Vital Signs: BP 116/68 (BP Location: Right Arm)   Pulse 98   Temp 98.2 F (36.8 C) (Oral)   Resp 18   Ht 5\' 11"  (1.803 m)   Wt 234 lb (106.1 kg)   SpO2 94%   BMI 32.64 kg/m   Advance Care Plan: The advanced care place/surrogate decision maker was discussed at the time of visit and the  patient did not wish to discuss  or was not able to name a surrogate decision maker or provide an advance care plan.  Physical Exam Vitals reviewed.  Constitutional:      General: John [name] is not in acute distress.    Appearance: Normal appearance.  HENT:     Mouth/Throat:     Mouth: Mucous membranes are moist.  Cardiovascular:     Rate and Rhythm: Normal rate and regular rhythm.     Pulses: Normal pulses.     Heart sounds: No murmur heard. Pulmonary:     Effort: Pulmonary effort is normal.     Breath sounds: Normal breath sounds.  Abdominal:     General: Abdomen is flat.  Musculoskeletal:        General: Tenderness present.       Arms:     Cervical back: Normal range of motion.     Comments: Point tenderness at L2 vertebra, with radiation to bilateral flank and thighs.  Skin:    General: Skin is warm and dry.  Neurological:     Mental Status: John [name] is alert and oriented to person, place, and time.  Psychiatric:        Mood and Affect: Mood normal.        Behavior: Behavior normal.        Thought Content: Thought content normal.        Judgment: Judgment normal.     Imaging: MR LUMBAR SPINE W WO CONTRAST Result Date: 07/05/2023 CLINICAL DATA:  Initial evaluation for acute back pain, metastatic disease evaluation. EXAM: MRI LUMBAR SPINE WITHOUT AND WITH CONTRAST TECHNIQUE: Multiplanar and multiecho pulse sequences of the lumbar spine were obtained without and with intravenous contrast. CONTRAST:  10mL GADAVIST  GADOBUTROL  1 MMOL/ML IV SOLN COMPARISON:  Prior study from 06/06/2023 FINDINGS: Segmentation: Standard. Lowest well-formed disc space labeled the L5-S1 level. Alignment: Physiologic with preservation of the normal lumbar lordosis. No listhesis. Vertebrae: Acute compression fracture extending through the inferior endplate of L2 with associated marrow edema and enhancement, new from prior. This is superimposed on underlying chronic L2 compression fracture. Associated height loss  measures up to 50-60% without significant bony retropulsion this is benign/mechanical in appearance. Additional chronic compression deformities involving the T12, L1, L3, L4, and L5 vertebral bodies again seen, otherwise stable. Sequelae of prior vertebral augmentation at T12, L1, and L3. Underlying bone marrow signal intensity within normal limits. No worrisome osseous lesions or evidence for metastatic disease. No other abnormal enhancement. Conus medullaris and cauda equina: Conus extends to the T12 level. Conus and cauda equina appear normal. Paraspinal and other soft tissues: Paraspinous soft tissues demonstrate no acute finding. 1.1 cm exophytic left renal cyst noted, benign in appearance, no follow-up imaging recommended. 3.7 cm intra-abdominal aortic aneurysm noted. Disc levels: L1-2: Disc desiccation with diffuse disc bulge. Mild bilateral facet spurring. No significant spinal stenosis. Foramina remain patent. L2-3: Diffuse disc bulge with disc desiccation. Mild bilateral facet hypertrophy. Mild prominence of the dorsal epidural fat. Resultant mild spinal stenosis with mild bilateral foraminal narrowing. L3-4: Disc desiccation with mild diffuse disc bulge. Superimposed left foraminal to extraforaminal disc protrusion contacts the exiting left L3 nerve root. Mild facet and ligament flavum hypertrophy. No more than mild spinal stenosis. Mild bilateral L3 foraminal narrowing. L4-5: Disc desiccation with mild disc bulge. Superimposed broad base left subarticular to foraminal disc protrusion (series 5, image 26). Mild facet and ligament flavum hypertrophy. Resultant moderate canal with left greater than right lateral recess stenosis. Mild to moderate bilateral L4 foraminal narrowing. L5-S1:  Disc desiccation with mild disc bulge. Small posterior annular fissure. Moderate left with mild right facet hypertrophy. No significant spinal stenosis. Foramina remain adequately patent. IMPRESSION: 1. Acute compression  fracture involving the inferior endplate of L2 with up to 60% height loss without significant bony retropulsion. This is superimposed on an underlying chronic L2 compression fracture, and is benign/mechanical in appearance. No evidence for metastatic disease within the lumbar spine. 2. Additional chronic compression deformities involving the T12, L1, L3, L4, and L5 vertebral bodies, otherwise stable. 3. Multifactorial degenerative changes at L4-5 with resultant moderate canal and left greater than right lateral recess stenosis, with mild to moderate bilateral L4 foraminal narrowing. 4. Left foraminal to extraforaminal disc protrusion at L3-4, potentially affecting the exiting left L3 nerve root. 5. 3.7 cm intra-abdominal aortic aneurysm. Recommend follow-up every 2 years. This recommendation follows ACR consensus guidelines: White Paper of the ACR Incidental Findings Committee II on Vascular Findings. J Am Coll Radiol 2013; 10:789-794. Electronically Signed   By: Virgia Griffins M.D.   On: 07/05/2023 02:20   MR THORACIC SPINE W WO CONTRAST Result Date: 07/05/2023 CLINICAL DATA:  Initial evaluation for severe back pain, metastatic disease evaluation EXAM: MRI THORACIC WITHOUT AND WITH CONTRAST TECHNIQUE: Multiplanar and multiecho pulse sequences of the thoracic spine were obtained without and with intravenous contrast. CONTRAST:  10mL GADAVIST  GADOBUTROL  1 MMOL/ML IV SOLN COMPARISON:  Prior studies from 07/02/2023 and earlier. FINDINGS: Alignment: Straightening of the normal thoracic kyphosis with underlying mild dextroscoliosis. No listhesis. Vertebrae: Compression deformity involving the superior endplate of T11 with mild residual marrow edema and enhancement, consistent with an evolving subacute fracture. Height loss is not significantly changed or progressed as compared to prior MRI from 06/06/2023. This is benign/mechanical in appearance. Additional chronic compression deformities involving the T12 and  L1 vertebral bodies with sequelae of prior vertebral augmentation, stable. Vertebral body height otherwise maintained with no other acute or interval fracture. Bone marrow signal intensity within normal limits. No worrisome osseous lesions or evidence for metastatic disease. No other abnormal enhancement. Cord:  Normal signal and morphology.  No abnormal enhancement. Paraspinal and other soft tissues: Paraspinous soft tissues demonstrate no acute finding. Post treatment changes with fibrotic changes noted within the partially visualized right lung. Known right adrenal metastasis noted, grossly similar to prior CT. Disc levels: T6-7: Small left paracentral disc protrusion indents the ventral thecal sac. No spinal stenosis. Foramina remain patent. T7-8: Central disc protrusion indents the ventral thecal sac. Mild flattening of the ventral cord without cord signal changes or significant spinal stenosis. Foramina remain patent. T10-11: Mild disc bulge with facet hypertrophy. No canal stenosis. Moderate right with mild left foraminal stenosis. Otherwise, minor for age spondylosis elsewhere within the thoracic spine. No other significant stenosis or impingement. IMPRESSION: 1. No evidence for metastatic disease within the thoracic spine. 2. Evolving subacute compression fracture involving the superior endplate of T11 with no more than mild residual marrow edema. Height loss is not significantly changed or progressed as compared to prior MRI from 06/06/2023. 3. Chronic compression deformities involving the T12 and L1 vertebral bodies with sequelae of prior vertebral augmentation, stable. 4. Small disc protrusions at T6-7 and T7-8 without significant stenosis. Moderate right with mild left foraminal stenosis at T10-11. 5. Post treatment changes within the visualized right lung with known right adrenal metastasis, described on recent CTs. Electronically Signed   By: Virgia Griffins M.D.   On: 07/05/2023 02:05   DG HIP  UNILAT WITH PELVIS 2-3 VIEWS LEFT  Result Date: 07/04/2023 CLINICAL DATA:  Left hip pain. EXAM: DG HIP (WITH OR WITHOUT PELVIS) 2-3V LEFT COMPARISON:  KUB 07/03/2023, CT chest, abdomen, and pelvis 06/23/2023 FINDINGS: There is diffuse decreased bone mineralization. The bilateral femoroacetabular joint spaces are maintained. Mild bilateral superolateral acetabular degenerative osteophytosis. The bilateral sacroiliac and pubic symphysis joint spaces are maintained. No acute fracture or dislocation. L3 vertebral body and partially visualized L1 vertebral body augmentation cement again noted. Mild to moderate atherosclerotic calcifications. IMPRESSION: Mild bilateral femoroacetabular osteoarthritis. Electronically Signed   By: Bertina Broccoli M.D.   On: 07/04/2023 09:23   DG Abd 1 View Result Date: 07/03/2023 CLINICAL DATA:  Constipation. Patient reports left bowel movement was 5 days ago. EXAM: ABDOMEN - 1 VIEW COMPARISON:  CT 06/23/2023 FINDINGS: Diminished stool burden from prior CT. No significant formed stool throughout the colon. There is scattered air throughout nondilated small bowel in the central abdomen. Cholecystectomy clips in the right upper quadrant. Excreted IV contrast in the urinary bladder from yesterday's chest CTA. Multilevel vertebral augmentation. IMPRESSION: 1. No significant formed stool in the colon. 2. Air scattered throughout nondilated small bowel, query ileus. Electronically Signed   By: Chadwick Colonel M.D.   On: 07/03/2023 14:55   CT Angio Chest PE W and/or Wo Contrast Result Date: 07/02/2023 CLINICAL DATA:  Right-sided chest pain. EXAM: CT ANGIOGRAPHY CHEST WITH CONTRAST TECHNIQUE: Multidetector CT imaging of the chest was performed using the standard protocol during bolus administration of intravenous contrast. Multiplanar CT image reconstructions and MIPs were obtained to evaluate the vascular anatomy. RADIATION DOSE REDUCTION: This exam was performed according to the departmental  dose-optimization program which includes automated exposure control, adjustment of the mA and/or kV according to patient size and/or use of iterative reconstruction technique. CONTRAST:  75mL OMNIPAQUE  IOHEXOL  350 MG/ML SOLN COMPARISON:  CT angiogram chest abdomen and pelvis 06/23/2023. FINDINGS: Cardiovascular: Satisfactory opacification of the pulmonary arteries to the segmental level. No evidence of pulmonary embolism. Normal heart size. No pericardial effusion. Mediastinum/Nodes: No enlarged mediastinal, hilar, or axillary lymph nodes. Thyroid  gland, trachea, and esophagus demonstrate no significant findings. Lungs/Pleura: Mild emphysema again noted in the lung apices. Multifocal areas of ground-glass opacity are again seen throughout both lungs along with some peripheral reticular opacities. Fibrotic changes are again noted in the right lung base. There is some focal airspace opacity in the right perihilar region and superior segment of the right lower lobe with some central cavitation. There is no pleural effusion or pneumothorax. Compared to 06/23/2023 there has been no significant interval change. Upper Abdomen: No acute abnormality. Cholecystectomy clips are present. Infrarenal abdominal aortic aneurysm measures up to 3.3 cm and appears unchanged. Musculoskeletal: No acute osseous findings. Review of the MIP images confirms the above findings. IMPRESSION: 1. No evidence for pulmonary embolism. 2. Stable multifocal areas of ground-glass opacity throughout both lungs along with some peripheral reticular opacities. Findings are favored as infectious/inflammatory. 3. Stable focal airspace opacity in the right perihilar region and superior segment of the right lower lobe with some central cavitation. Findings may be infectious/inflammatory. 4. Stable 3.3 cm infrarenal abdominal aortic aneurysm. Recommend follow-up ultrasound every 3 years. Aortic Atherosclerosis (ICD10-I70.0) and Emphysema (ICD10-J43.9).  Electronically Signed   By: Tyron Gallon M.D.   On: 07/02/2023 17:48   CT T-SPINE NO CHARGE Result Date: 07/02/2023 CLINICAL DATA:  Lower back and bilateral lower extremity pain, stage IV lung cancer EXAM: CT Thoracic Spine with contrast TECHNIQUE: Multiplanar CT images of the thoracic spine were reconstructed from  contemporary CT of the Chest. RADIATION DOSE REDUCTION: This exam was performed according to the departmental dose-optimization program which includes automated exposure control, adjustment of the mA and/or kV according to patient size and/or use of iterative reconstruction technique. CONTRAST:  No additional COMPARISON:  06/23/2023 FINDINGS: Alignment: Alignment is grossly anatomic. Vertebrae: Stable compression deformities at T10, T11, T12, L1, and L2. Prior vertebral augmentations seen at T12 and L1. There are no acute displaced fractures. No destructive bony abnormalities. Paraspinal and other soft tissues: Paraspinal soft tissues are unremarkable. Continue consolidation cavitation within the right lower lobe. Please see separate CT chest report for findings in that region. Disc levels: There is no significant bony encroachment upon the central canal or neural foramina. Reconstructed images demonstrate no additional findings. IMPRESSION: 1. Chronic compression deformities from T10 through L2, with evidence of prior vertebral augmentation at T12 and L1. No acute thoracic spine fracture. 2. Please refer to separate CT chest report for findings in that region. Electronically Signed   By: Bobbye Burrow M.D.   On: 07/02/2023 17:47   DG Chest Port 1 View Result Date: 07/02/2023 CLINICAL DATA:  Lower back and bilateral lower extremity pain, stage IV lung cancer EXAM: PORTABLE CHEST 1 VIEW COMPARISON:  06/23/2023 FINDINGS: Single frontal view of the chest demonstrates a stable cardiac silhouette. Fibrotic changes in the right lung are stable, consistent with post treatment appearance in this patient  with history of lung cancer. There is progressive consolidation at the right lung base which may reflect new airspace disease or atelectasis. No effusion or pneumothorax. Left chest is clear. No acute bony abnormalities. IMPRESSION: 1. Increasing right basilar consolidation superimposed upon chronic post radiation fibrosis, which may reflect acute airspace disease or atelectasis. Electronically Signed   By: Bobbye Burrow M.D.   On: 07/02/2023 14:48   CT Angio Chest/Abd/Pel for Dissection W and/or Wo Contrast Result Date: 06/23/2023 CLINICAL DATA:  Weakness, body aches, nausea, lung cancer, chemotherapy today * Tracking Code: BO * EXAM: CT ANGIOGRAPHY CHEST, ABDOMEN AND PELVIS TECHNIQUE: Non-contrast CT of the chest was initially obtained. Multidetector CT imaging through the chest, abdomen and pelvis was performed using the standard protocol during bolus administration of intravenous contrast. Multiplanar reconstructed images and MIPs were obtained and reviewed to evaluate the vascular anatomy. RADIATION DOSE REDUCTION: This exam was performed according to the departmental dose-optimization program which includes automated exposure control, adjustment of the mA and/or kV according to patient size and/or use of iterative reconstruction technique. CONTRAST:  OMNIPAQUE  IOHEXOL  350 MG/ML SOLN COMPARISON:  MR abdomen, 06/07/2023, CT chest abdomen pelvis angiogram, 06/06/2023 FINDINGS: CTA CHEST FINDINGS VASCULAR Aorta: Satisfactory opacification of the aorta. Normal contour and caliber of the thoracic aorta. No evidence of aneurysm, dissection, or other acute aortic pathology. Mild mixed calcific atherosclerosis Cardiovascular: No evidence of pulmonary embolism on limited non-tailored examination. Mild cardiomegaly. Left coronary artery calcifications. No pericardial effusion. Review of the MIP images confirms the above findings. NON VASCULAR Mediastinum/Nodes: No enlarged mediastinal, hilar, or axillary lymph  nodes. Thyroid  gland, trachea, and esophagus demonstrate no significant findings. Lungs/Pleura: Unchanged post treatment appearance of the chest with dense consolidation and fibrosis of the perihilar right lung and right lower lobe, as well as some evidence of cavitary change in the right lower lobe (series 9, image 67). Mild underlying UIP pattern pulmonary fibrosis as well as moderate emphysema. No pleural effusion or pneumothorax. Musculoskeletal: No chest wall abnormality. No acute osseous findings. Review of the MIP images confirms the above findings.  CTA ABDOMEN AND PELVIS FINDINGS VASCULAR Unchanged infrarenal abdominal aortic aneurysm measuring 3.3 x 3.1 cm (series 13, image 197). Otherwise normal contour and caliber of the abdominal aorta. Moderate mixed calcific atherosclerosis. No evidence of acute aortic pathology. Standard branching pattern of the abdominal aorta with solitary bilateral renal arteries. Review of the MIP images confirms the above findings. NON-VASCULAR Hepatobiliary: No focal liver abnormality is seen. Status post interval cholecystectomy with minimal residual fluid in the gallbladder fossa (series 13, image 115). No biliary dilatation. Pancreas: Diffuse fatty atrophy of the pancreatic parenchyma. No pancreatic ductal dilatation or surrounding inflammatory changes. Spleen: Mild splenomegaly, maximum coronal span 13.1 cm. Adrenals/Urinary Tract: Unchanged right adrenal mass (series 13, image 126). Kidneys are normal, without renal calculi, solid lesion, or hydronephrosis. Bladder is unremarkable. Stomach/Bowel: Stomach is within normal limits. Appendix appears normal. No evidence of bowel wall thickening, distention, or inflammatory changes. Lymphatic: No enlarged abdominal or pelvic lymph nodes. Reproductive: No mass or other significant abnormality. Other: Small fat containing left inguinal hernia.  No ascites. Musculoskeletal: No acute osseous findings. IMPRESSION: 1. No evidence of  thoracic aortic aneurysm, dissection, or other acute aortic pathology. 2. Unchanged infrarenal abdominal aortic aneurysm measuring 3.3 x 3.1 cm. No acute pathology. Moderate aortic atherosclerosis. 3. Status post interval cholecystectomy with minimal residual postoperative fluid in the gallbladder fossa. No biliary ductal dilatation. 4. Unchanged post treatment appearance of the chest with dense consolidation and fibrosis of the perihilar right lung and right lower lobe, as well as some evidence of cavitary change in the right lower lobe. Mild underlying UIP pattern pulmonary fibrosis as well as moderate emphysema. 5. Unchanged right adrenal metastasis. 6. Coronary artery disease. Aortic Atherosclerosis (ICD10-I70.0) and Emphysema (ICD10-J43.9). Electronically Signed   By: Fredricka Jenny M.D.   On: 06/23/2023 20:28   DG Chest Port 1 View Result Date: 06/23/2023 CLINICAL DATA:  Chest pain. EXAM: PORTABLE CHEST 1 VIEW COMPARISON:  06/06/2023 CT and plain film. FINDINGS: Volume loss on the right. Increasing airspace disease in the right mid and lower lung. Fibrotic changes noted in the lung bases, similar to prior study. Heart and mediastinal contours within normal limits. No visible effusions or pneumothorax. IMPRESSION: Fibrotic changes in the lung bases. Worsening airspace disease in the right mid and lower lung. Electronically Signed   By: Janeece Mechanic M.D.   On: 06/23/2023 19:43   MR ABDOMEN MRCP W WO CONTAST Result Date: 06/08/2023 CLINICAL DATA:  Cholecystitis, biliary obstruction. EXAM: MRI ABDOMEN WITHOUT AND WITH CONTRAST (INCLUDING MRCP) TECHNIQUE: Multiplanar multisequence MR imaging of the abdomen was performed both before and after the administration of intravenous contrast. Heavily T2-weighted images of the biliary and pancreatic ducts were obtained, and three-dimensional MRCP images were rendered by post processing. CONTRAST:  10mL GADAVIST  GADOBUTROL  1 MMOL/ML IV SOLN COMPARISON:  Abdominal  ultrasound 06/06/2023 FINDINGS: Lower chest: Consolidation in the right lung especially in the right lower lobe with interstitial accentuation the left lung base. Hepatobiliary: 3.1 cm gallstone in the neck of the gallbladder. No biliary dilatation or findings of choledocholithiasis, to the extent that this can be assessed based on motion artifact. No pericholecystic fluid although there is some trace fluid along the inferior margin of the right hepatic lobe. Pancreas: Fatty atrophy of the pancreas. Spleen: The spleen measures 14.2 by 13.9 by 8.0 cm (volume = 830 cm^3), compatible with splenomegaly. Small bandlike hypodense areas of the peripheral spleen for example on image 57 series 18, similar to prior exams and likely incidental. Adrenals/Urinary Tract:  Right adrenal lesion 3.3 by 2.3 cm, much of this appears to represent a complex cystic component which does not appreciably enhance. However, a component of this process demonstrated accentuated metabolic activity on 02/11/2023 compatible with metastatic lesion in this patient with known lung cancer. Currently the enhancing portion of this lesion is minimal in size. Left adrenal gland unremarkable.  No significant renal abnormality. Stomach/Bowel: Unremarkable Vascular/Lymphatic: 3.3 cm infrarenal abdominal aortic aneurysm as shown previously. Other: Trace ascites along the inferior margin of the right hepatic lobe. Musculoskeletal: Prior compression fractures at all levels between T12 and L3 with vertebral augmentations at T12, L1, and L3. IMPRESSION: 1. 3.1 cm gallstone in the neck of the gallbladder. No biliary dilatation or findings of choledocholithiasis, to the extent that this can be assessed based on motion artifact. 2. Trace ascites along the inferior margin of the right hepatic lobe. 3. Right adrenal lesion 3.3 by 2.3 cm, much of this appears to represent a complex cystic component which does not appreciably enhance. However, a component of this process  demonstrated accentuated metabolic activity on 02/11/2023 compatible with metastatic lesion in this patient with known lung cancer. 4. Consolidation in the right lung especially in the right lower lobe with interstitial accentuation the left lung base. 5. 3.3 cm infrarenal abdominal aortic aneurysm. Recommend follow-up ultrasound every 3 years. This recommendation follows ACR consensus guidelines: White Paper of the ACR Incidental Findings Committee II on Vascular Findings. J Am Coll Radiol 2013; 10:789-794. 6. Splenomegaly. 7. Prior compression fractures at all levels between T12 and L3 with vertebral augmentations at T12, L1, and L3. Electronically Signed   By: Freida Jes M.D.   On: 06/08/2023 11:00   MR 3D Recon At Scanner Result Date: 06/08/2023 CLINICAL DATA:  Cholecystitis, biliary obstruction. EXAM: MRI ABDOMEN WITHOUT AND WITH CONTRAST (INCLUDING MRCP) TECHNIQUE: Multiplanar multisequence MR imaging of the abdomen was performed both before and after the administration of intravenous contrast. Heavily T2-weighted images of the biliary and pancreatic ducts were obtained, and three-dimensional MRCP images were rendered by post processing. CONTRAST:  10mL GADAVIST  GADOBUTROL  1 MMOL/ML IV SOLN COMPARISON:  Abdominal ultrasound 06/06/2023 FINDINGS: Lower chest: Consolidation in the right lung especially in the right lower lobe with interstitial accentuation the left lung base. Hepatobiliary: 3.1 cm gallstone in the neck of the gallbladder. No biliary dilatation or findings of choledocholithiasis, to the extent that this can be assessed based on motion artifact. No pericholecystic fluid although there is some trace fluid along the inferior margin of the right hepatic lobe. Pancreas: Fatty atrophy of the pancreas. Spleen: The spleen measures 14.2 by 13.9 by 8.0 cm (volume = 830 cm^3), compatible with splenomegaly. Small bandlike hypodense areas of the peripheral spleen for example on image 57 series 18,  similar to prior exams and likely incidental. Adrenals/Urinary Tract: Right adrenal lesion 3.3 by 2.3 cm, much of this appears to represent a complex cystic component which does not appreciably enhance. However, a component of this process demonstrated accentuated metabolic activity on 02/11/2023 compatible with metastatic lesion in this patient with known lung cancer. Currently the enhancing portion of this lesion is minimal in size. Left adrenal gland unremarkable.  No significant renal abnormality. Stomach/Bowel: Unremarkable Vascular/Lymphatic: 3.3 cm infrarenal abdominal aortic aneurysm as shown previously. Other: Trace ascites along the inferior margin of the right hepatic lobe. Musculoskeletal: Prior compression fractures at all levels between T12 and L3 with vertebral augmentations at T12, L1, and L3. IMPRESSION: 1. 3.1 cm gallstone in the neck of the  gallbladder. No biliary dilatation or findings of choledocholithiasis, to the extent that this can be assessed based on motion artifact. 2. Trace ascites along the inferior margin of the right hepatic lobe. 3. Right adrenal lesion 3.3 by 2.3 cm, much of this appears to represent a complex cystic component which does not appreciably enhance. However, a component of this process demonstrated accentuated metabolic activity on 02/11/2023 compatible with metastatic lesion in this patient with known lung cancer. 4. Consolidation in the right lung especially in the right lower lobe with interstitial accentuation the left lung base. 5. 3.3 cm infrarenal abdominal aortic aneurysm. Recommend follow-up ultrasound every 3 years. This recommendation follows ACR consensus guidelines: White Paper of the ACR Incidental Findings Committee II on Vascular Findings. J Am Coll Radiol 2013; 10:789-794. 6. Splenomegaly. 7. Prior compression fractures at all levels between T12 and L3 with vertebral augmentations at T12, L1, and L3. Electronically Signed   By: Freida Jes M.D.    On: 06/08/2023 11:00   NM Hepatobiliary Liver Func Result Date: 06/07/2023 CLINICAL DATA:  Cholecystitis suspected. EXAM: NUCLEAR MEDICINE HEPATOBILIARY IMAGING TECHNIQUE: Sequential images of the abdomen were obtained out to 60 minutes following intravenous administration of radiopharmaceutical. RADIOPHARMACEUTICALS:  7.83 mCi Tc-49m  Choletec  IV COMPARISON:  None Available. FINDINGS: Prompt uptake and biliary excretion of activity by the liver is seen. No gallbladder activity is visualized including after administration of 3 mg of morphine  after 60 minutes of imaging. Biliary activity passes into small bowel, consistent with patent common bile duct. IMPRESSION: No uptake in the gallbladder compatible with acute cholecystitis. Electronically Signed   By: Rozell Cornet M.D.   On: 06/07/2023 20:12   US  Abdomen Limited RUQ (LIVER/GB) Result Date: 06/06/2023 CLINICAL DATA:  Emesis EXAM: ULTRASOUND ABDOMEN LIMITED RIGHT UPPER QUADRANT COMPARISON:  CT 06/06/2023 FINDINGS: Gallbladder: Slightly distended gallbladder with shadowing stone at the neck. Slight increased wall thickness at 3.8 mm but no sonographic Murphy. Common bile duct: Diameter: 4.6 mm Liver: Echogenic liver parenchyma. Poorly visible due to habitus and bowel gas. Portal vein is patent on color Doppler imaging with normal direction of blood flow towards the liver. Other: None. IMPRESSION: 1. Cholelithiasis with slightly distended gallbladder and slight increased wall thickness but no sonographic Murphy. If there is high clinical concern for acute cholecystitis, consider correlation with nuclear medicine hepatobiliary imaging 2. Echogenic liver parenchyma consistent with hepatic steatosis and or hepatocellular disease. Electronically Signed   By: Esmeralda Hedge M.D.   On: 06/06/2023 20:18   CT Angio Chest/Abd/Pel for Dissection W and/or Wo Contrast Result Date: 06/06/2023 CLINICAL DATA:  Acute aortic syndrome suspected. Increased white blood cell  count. EXAM: CT ANGIOGRAPHY CHEST, ABDOMEN AND PELVIS TECHNIQUE: Non-contrast CT of the chest was initially obtained. Multidetector CT imaging through the chest, abdomen and pelvis was performed using the standard protocol during bolus administration of intravenous contrast. Multiplanar reconstructed images and MIPs were obtained and reviewed to evaluate the vascular anatomy. RADIATION DOSE REDUCTION: This exam was performed according to the departmental dose-optimization program which includes automated exposure control, adjustment of the mA and/or kV according to patient size and/or use of iterative reconstruction technique. CONTRAST:  OMNIPAQUE  IOHEXOL  350 MG/ML SOLN COMPARISON:  CT chest abdomen and pelvis 04/29/2023. FINDINGS: CTA CHEST FINDINGS Cardiovascular: Preferential opacification of the thoracic aorta. No evidence of thoracic aortic aneurysm or dissection. Normal heart size. No pericardial effusion. There are atherosclerotic calcifications of the aorta. Mediastinum/Nodes: No enlarged mediastinal, hilar, or axillary lymph nodes. Thyroid  gland, trachea, and  esophagus demonstrate no significant findings. Lungs/Pleura: Moderate emphysematous changes are again seen. Fibrotic changes in the lower lung predominance appear similar to the prior study. Patchy ground-glass opacities in the inferior right lower lobe and airspace consolidation in the superior segment of the right lower lobe and adjacent right upper lobe are again seen. Right upper lobe airspace consolidation has mildly increased. There is no pneumothorax or pleural effusion. Musculoskeletal: Healed rib fractures are again noted. No acute fractures are seen. T12 vertebral plasty changes are chronic fresh fracture deformity of T11 is unchanged. Review of the MIP images confirms the above findings. CTA ABDOMEN AND PELVIS FINDINGS VASCULAR Aorta: Infrarenal abdominal aortic aneurysm measuring 3.4 cm appears unchanged. There are atherosclerotic  calcifications of the aorta. There is no dissection or inflammation. Celiac: Patent without evidence of aneurysm, dissection, vasculitis or significant stenosis. SMA: Patent without evidence of aneurysm, dissection, vasculitis or significant stenosis. Renals: Both renal arteries are patent without evidence of aneurysm, dissection, vasculitis, fibromuscular dysplasia or significant stenosis. IMA: Patent without evidence of aneurysm, dissection, vasculitis or significant stenosis. Inflow: Patent without evidence of aneurysm, dissection, vasculitis or significant stenosis. Calcified atherosclerotic disease is present. Veins: No obvious venous abnormality within the limitations of this arterial phase study. Review of the MIP images confirms the above findings. NON-VASCULAR Hepatobiliary: Gallbladder is distended. There is a gallstone in the gallbladder bladder neck measuring 16 mm. There is no pericholecystic inflammation. No focal liver lesions are identified. There is no biliary ductal dilatation. Pancreas: Unremarkable. No pancreatic ductal dilatation or surrounding inflammatory changes. Spleen: The spleen is mildly enlarged. Adrenals/Urinary Tract: Indeterminate right adrenal nodule measuring 2.6 cm is unchanged. Left adrenal gland is within normal limits. Left renal cyst measuring 9 mm is unchanged. There is no hydronephrosis in either kidney. The bladder is within normal limits. Stomach/Bowel: Stomach is within normal limits. Appendix appears normal. No evidence of bowel wall thickening, distention, or inflammatory changes. Lymphatic: No enlarged lymph nodes are seen. Reproductive: Prostate gland is within normal limits. Other: There are small fat containing left inguinal and umbilical hernias. There is no ascites. Musculoskeletal: L1 and L3 vertebroplasty changes are present. Review of the MIP images confirms the above findings. IMPRESSION: 1. No evidence for thoracic aortic dissection or aneurysm. 2. Stable 3.4  cm infrarenal abdominal aortic aneurysm. Recommend follow-up ultrasound every 3 years. 3. Stable emphysema and fibrotic changes. 4. Mild increase in airspace consolidation in the right upper lobe worrisome for worsening pneumonia. 5. Cholelithiasis with distended gallbladder. No pericholecystic inflammation. 6. Stable indeterminate right adrenal nodule. 7. Mild splenomegaly. Aortic Atherosclerosis (ICD10-I70.0) and Emphysema (ICD10-J43.9). Electronically Signed   By: Tyron Gallon M.D.   On: 06/06/2023 16:12   DG Chest Port 1 View Result Date: 06/06/2023 CLINICAL DATA:  63 year old male with possible sepsis. Metastatic lung cancer. Fall yesterday. EXAM: PORTABLE CHEST 1 VIEW COMPARISON:  Restaging CT Chest, Abdomen, and Pelvis today are reported separately. 04/29/2023. FINDINGS: Portable AP semi upright view at 1024 hours. Chronically low lung volumes, slightly lower when compared to 04/29/2023 radiographs today. Stable mediastinal contours with right hilar architectural distortion. Combined right lung fibrosis and emphysema demonstrated by CT last month. No superimposed pneumothorax, pulmonary edema, pleural effusion, acute lung opacity. Visualized tracheal air column is within normal limits. Stable visualized osseous structures. Paucity of bowel gas. IMPRESSION: No acute cardiopulmonary abnormality; low lung volumes with underlying emphysema and post treatment right lung fibrosis. Electronically Signed   By: Marlise Simpers M.D.   On: 06/06/2023 12:41   MR Lumbar  Spine W Wo Contrast Result Date: 06/06/2023 CLINICAL DATA:  63 year old male with stage IV lung cancer. Fall yesterday. Low back pain. EXAM: MRI LUMBAR SPINE WITHOUT AND WITH CONTRAST TECHNIQUE: Multiplanar and multiecho pulse sequences of the lumbar spine were obtained without and with intravenous contrast. CONTRAST:  10mL GADAVIST  GADOBUTROL  1 MMOL/ML IV SOLN COMPARISON:  Lumbar MRI 08/20/2021.  Lumbar spine CT 10/02/2021. FINDINGS: Segmentation:  Normal  on the comparison CT. Alignment: Lumbar lordosis not significantly changed from the 2023 CT. Vertebrae: Chronic, and several previously augmented, compression fractures T11 through L5. The T11 compression is new since 10/02/2021, but chronic with absent marrow edema. The other levels appear stable since 10/02/2021. Background bone marrow signal is within normal limits. Visible sacrum and SI joints are intact. No convincing marrow edema or acute osseous abnormality. Conus medullaris and cauda equina: Conus extends to the T11 level. Conu No lower spinal cord or conus signal abnormality. No abnormal intradural enhancement. No dural thickening. Paraspinal and other soft tissues: Infrarenal abdominal aortic aneurysm, approximately 36 mm diameter now on series 9, image 12. This compares to 34 mm on 10/02/2021. Negative visualized posterior paraspinal soft tissues. Disc levels: Spinal stenosis throughout the lumbar spine related to disc bulging and mild retropulsion. This is mild to moderate at most levels. But moderate to severe multifactorial at the L4-L5 level. IMPRESSION: 1. Diffuse lower thoracic and lumbar compression fractures are chronic and appear likely to be benign. No acute or metastatic process identified in the lumbar spine. 2. Diffuse multifactorial lumbar spinal stenosis, moderate to severe at L4-L5. 3. Abdominal aortic aneurysm measuring 3.6 cm. Recommend surveillance ultrasound in 3 years. Reference: Journal of Vascular Surgery 67.1 (2018): 2-77. J Am Coll Radiol [phone]. Electronically Signed   By: Marlise Simpers M.D.   On: 06/06/2023 12:39    Labs:  CBC: Recent Labs    07/02/23 1423 07/03/23 0500 07/04/23 0331 07/05/23 0428  WBC 1.3* 1.5* 1.0* 1.0*  HGB 11.3* 11.4* 11.4* 11.2*  HCT 33.3* 32.8* 31.8* 33.4*  PLT 175 211 188 205    COAGS: Recent Labs    04/29/23 0511 06/06/23 1002 07/04/23 1318  INR 1.2 1.6* 1.4*    BMP: Recent Labs    07/02/23 1423 07/02/23 2330  07/03/23 0500 07/04/23 0331 07/05/23 0428  NA 124* 122* 123* 124* 131*  K 3.3*  --  3.8 3.5 3.7  CL 91*  --  91* 95* 103  CO2 24  --  22 19* 20*  GLUCOSE 103*  --  121* 105* 107*  BUN 22  --  17 14 17   CALCIUM  8.4*  --  7.8* 8.1* 8.1*  CREATININE 0.87  --  0.83 0.78 0.90  GFRNONAA >60  --  >60 >60 >60    LIVER FUNCTION TESTS: Recent Labs    06/23/23 2015 07/01/23 0814 07/02/23 1423 07/03/23 0500  BILITOT 1.1 1.1 1.6* 1.3*  AST 23 42* 28 29  ALT 18 55* 50* 48*  ALKPHOS 83 84 71 67  PROT 5.6* 6.5 5.8* 5.5*  ALBUMIN  3.0* 4.3 3.3* 3.3*    TUMOR MARKERS: No results for input(s): "AFPTM", "CEA", "CA199", "CHROMGRNA" in the last 8760 hours.  Assessment and Plan: Patient presented to ED on 5/17, complaining of worsening back pain and shortness of breath. Patient reports chronic back pain, but the pain that day was severe, not relieved by home percocet, and prompting his ED visit. John [name] describes the pain as sharp in his lower back, radiating to both legs.  Per  Dr. Birt Bulla progress note today: "John [name] still have a lot of pain.  I think the MRI shows us  the problem.  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  Has a new 1 at L2 with 60% of vertebral height.  I think this is where his pain is coming from. Will see if IR can do vertebroplasty on him."  Patient will present for tentatively scheduled L 2 kyphoplasty in IR on 07/07/23, due to required Eliquis  washout period. John [name] will require transfer from [facility] to [facility] for procedure, and return transfer to [facility] after procedure.  Eliquis  is held. Last dose on 5/19 at 22:30. John [name] remains on a Heparin  drip which will be paused 2 hours ahead of procedure.  Patient will have been NPO since midnight.  All labs and medications are within acceptable parameters.  No pertinent allergies.   Risks and benefits of lumbar 2 vertebra kyphoplasty were discussed with the patient including, but not limited to education regarding the natural healing process of  compression fractures without intervention, bleeding, infection, cement migration which may cause spinal cord damage, paralysis, pulmonary embolism or even death.  This interventional procedure involves the use of X-rays and because of the nature of the planned procedure, it is possible that we will have prolonged use of X-ray fluoroscopy.  Potential radiation risks to you include (but are not limited to) the following: - A slightly elevated risk for cancer several years later in life. This risk is typically less than 0.5% percent. This risk is low in comparison to the normal incidence of human cancer, which is 33% for women and 50% for men according to the [employer]. - Radiation induced injury can include skin redness, resembling a rash, tissue breakdown / ulcers and hair loss (which can be temporary or permanent).   The likelihood of either of these occurring depends on the difficulty of the procedure and whether you are sensitive to radiation due to previous procedures, disease, or genetic conditions.   IF your procedure requires a prolonged use of radiation, you will be notified and given written instructions for further action.  It is your responsibility to monitor the irradiated area for the 2 weeks following the procedure and to notify your physician if you are concerned that you have suffered a radiation induced injury.    All of the patient's questions were answered, patient is agreeable to proceed.  Consent signed and in chart.     Thank you for allowing our service to participate in Dameir Lair 's care.  Electronically Signed: Lovena Rubinstein, PA-C   07/05/2023, 2:55 PM      I spent a total of 40 Minutes in face to face in clinical consultation, greater than 50% of which was counseling/coordinating care for for acute compression fracture of L 2 vertebra, with consideration for kyphoplasty.

## 2023-07-05 NOTE — Progress Notes (Signed)
 PROGRESS NOTE    John [name]  ZOX:[id number] DOB: [birth year] DOA: 07/02/2023 PCP: Patient, No Pcp Per     Brief Narrative:  John [name] is a 63 y.o. 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  Patient reports she has had back pain for months but the pain was too severe today to tolerate.  His home Percocet is not controlling his pain.  He describes the pain as sharp in his low back, radiating to both legs. He also reports difficulty breathing today.    On EMS arrival, patient was found to have SpO2 of 88%.  He did require up to 15 L high flow oxygen.  He was started on empiric IV antibiotics to cover HCAP.  Due to patient's intractable back pain, he underwent MRI which revealed acute compression fracture of L2 as well as chronic compression deformities.  New events last 24 hours / Subjective: Continues to experience breakthrough pain.  Currently awaiting kyphoplasty.  Assessment & Plan:   Principal Problem:   Acute on chronic respiratory failure with hypoxia (HCC) Active Problems:   Hypokalemia   Hyponatremia   Intractable back pain   Pain of metastatic malignancy   HCAP (healthcare-associated pneumonia)   Acute on chronic hypoxic respiratory failure, with HCAP, history of pulmonary fibrosis, COPD, stage IV lung cancer - Uses 3 L nasal cannula O2 at baseline.  He desaturated to 83% and required up to 15 L high flow nasal cannula.  Currently on 4 L nasal cannula O2 - MRSA PCR negative - Strep pneumo antigen negative - Legionella antigen pending - COVID, RSV, influenza, respiratory viral panel  negative  - Empiric cefepime   Intractable back pain secondary to compression fracture - Appreciate palliative care medicine for symptom management.  Started fentanyl  patch, Dilaudid , Lidoderm  patch, Robaxin , gabapentin  - CT thoracic spine revealed chronic compression deformities T10-L2.  He did have kyphoplasty of T12 back in July 2023, and kyphoplasty L1 and L3 in August 2023 - MRI thoracic and lumbar spine revealed L2 compression fracture - IR consulted for kyphoplasty  Stage IV lung cancer - Currently under active therapy with  Gemzar  and Navelbine  - Followed by Dr. Maria Shiner  Hypovolemic hyponatremia, SIADH  - Urine osmole 575  - Urine sodium 129 - Salt tabs and fluid restriction - Improved  Paroxysmal A-fib - Toprol , amiodarone , Eliquis   Generalized weakness - PT OT  Constipation - Resolved.  Continue bowel regimen  AAA - Stable 3.3 cm infrarenal abdominal aortic aneurysm. Recommend follow-up ultrasound every 3 years.   DVT prophylaxis: Eliquis  currently on hold pending kyphoplasty   Code Status: Full code Family Communication: None at bedside Disposition Plan: Home Status is: Inpatient Remains inpatient appropriate because: IV antibiotics, further pain control.  IR consult    Antimicrobials:  Anti-infectives (From admission, onward)    Start     Dose/Rate Route Frequency Ordered Stop   07/03/23 0800  vancomycin  (VANCOCIN ) IVPB 1000 mg/200 mL premix  Status:  Discontinued        1,000 mg 200 mL/hr over 60 Minutes Intravenous Every 12 hours 07/02/23 2146 07/03/23 0722   07/03/23 0200  ceFEPIme  (MAXIPIME ) 2 g in sodium chloride  0.9 % 100  mL IVPB        2 g 200 mL/hr over 30 Minutes Intravenous Every 8 hours 07/02/23 2141     07/02/23 1845  vancomycin  (VANCOREADY) IVPB 2000 mg/400 mL        2,000 mg 200 mL/hr over 120 Minutes Intravenous  Once 07/02/23 1813 07/02/23 2120   07/02/23 1815  vancomycin  (VANCOCIN ) IVPB 1000 mg/200 mL premix  Status:  Discontinued         1,000 mg 200 mL/hr over 60 Minutes Intravenous  Once 07/02/23 1807 07/02/23 1813   07/02/23 1815  ceFEPIme  (MAXIPIME ) 2 g in sodium chloride  0.9 % 100 mL IVPB        2 g 200 mL/hr over 30 Minutes Intravenous  Once 07/02/23 1807 07/02/23 1911        Objective: Vitals:   07/04/23 2109 07/05/23 0410 07/05/23 0916 07/05/23 1009  BP: 104/87 96/67  99/69  Pulse: 97 92  91  Resp: 19 17    Temp: 98 F (36.7 C) 97.6 F (36.4 C)    TempSrc: Oral Oral    SpO2: 90% 93% 95% 96%  Weight:      Height:        Intake/Output Summary (Last 24 hours) at 07/05/2023 1059 Last data filed at 07/05/2023 1000 Gross per 24 hour  Intake 611.41 ml  Output 1800 ml  Net -1188.59 ml   Filed Weights   07/03/23 0155  Weight: 106.1 kg    Examination:  General exam: Appears calm, weak, uncomfortable  Data Reviewed: I have personally reviewed following labs and imaging studies  CBC: Recent Labs  Lab 07/01/23 0814 07/02/23 1423 07/03/23 0500 07/04/23 0331 07/05/23 0428  WBC 4.3 1.3* 1.5* 1.0* 1.0*  NEUTROABS 3.9 0.6*  --   --   --   HGB 12.6* 11.3* 11.4* 11.4* 11.2*  HCT 36.8* 33.3* 32.8* 31.8* 33.4*  MCV 88.0 89.3 87.9 88.1 92.5  PLT 259 175 211 188 205   Basic Metabolic Panel: Recent Labs  Lab 07/01/23 0814 07/02/23 1423 07/02/23 1907 07/02/23 2330 07/03/23 0500 07/04/23 0331 07/05/23 0428  NA 132* 124*  --  122* 123* 124* 131*  K 4.2 3.3*  --   --  3.8 3.5 3.7  CL 97* 91*  --   --  91* 95* 103  CO2 24 24  --   --  22 19* 20*  GLUCOSE 176* 103*  --   --  121* 105* 107*  BUN 21 22  --   --  17 14 17   CREATININE 1.04 0.87  --   --  0.83 0.78 0.90  CALCIUM  8.9 8.4*  --   --  7.8* 8.1* 8.1*  MG  --   --  1.8  --   --   --   --   PHOS  --   --  2.4*  --  4.0  --   --    GFR: Estimated Creatinine Clearance: 106.8 mL/min (by C-G formula based on SCr of 0.9 mg/dL). Liver Function Tests: Recent Labs  Lab 07/01/23 0814 07/02/23 1423 07/03/23 0500  AST 42* 28 29  ALT 55*  50* 48*  ALKPHOS 84 71 67  BILITOT 1.1 1.6* 1.3*  PROT 6.5 5.8* 5.5*  ALBUMIN  4.3 3.3* 3.3*   No results for input(s): "LIPASE", "AMYLASE" in the last 168 hours. No results for input(s): "AMMONIA" in the last 168 hours. Coagulation Profile: Recent Labs  Lab 07/04/23 1318  INR 1.4*   Cardiac  Enzymes: No results for input(s): "CKTOTAL", "CKMB", "CKMBINDEX", "TROPONINI" in the last 168 hours. BNP (last 3 results) No results for input(s): "PROBNP" in the last 8760 hours. HbA1C: No results for input(s): "HGBA1C" in the last 72 hours. CBG: No results for input(s): "GLUCAP" in the last 168 hours. Lipid Profile: No results for input(s): "CHOL", "HDL", "LDLCALC", "TRIG", "CHOLHDL", "LDLDIRECT" in the last 72 hours. Thyroid  Function Tests: Recent Labs    07/02/23 1947 07/02/23 2000  TSH 1.306  --   FREET4  --  1.79*   Anemia Panel: No results for input(s): "VITAMINB12", "FOLATE", "FERRITIN", "TIBC", "IRON", "RETICCTPCT" in the last 72 hours. Sepsis Labs: Recent Labs  Lab 07/02/23 1907  PROCALCITON 0.11    Recent Results (from the past 240 hours)  Culture, blood (Routine X 2) w Reflex to ID Panel     Status: None (Preliminary result)   Collection Time: 07/02/23 10:15 PM   Specimen: BLOOD RIGHT ARM  Result Value Ref Range Status   Specimen Description   Final    BLOOD RIGHT ARM Performed at [facility], [address] [place], [place] [address]    Special Requests   Final    BOTTLES DRAWN AEROBIC AND ANAEROBIC Blood Culture results may not be optimal due to an inadequate volume of blood received in culture bottles Performed at [facility], [address], [place], [place] [address]    Culture   Final    NO GROWTH 3 DAYS Performed at [facility], [address], [place], [place] [address]    Report Status PENDING  Incomplete  Culture, blood (Routine X 2) w Reflex to ID Panel     Status: None (Preliminary result)   Collection Time:  07/02/23 10:15 PM   Specimen: BLOOD RIGHT ARM  Result Value Ref Range Status   Specimen Description   Final    BLOOD RIGHT ARM Performed at [facility], [address] [place], [place] [address]    Special Requests   Final    BOTTLES DRAWN AEROBIC AND ANAEROBIC Blood Culture results may not be optimal due to an inadequate volume of blood received in culture bottles Performed at [facility], [address] [place], [place] [address]    Culture   Final    NO GROWTH 3 DAYS Performed at [facility], [address] [place], [place] [address]    Report Status PENDING  Incomplete  MRSA Next Gen by PCR, Nasal     Status: None   Collection Time: 07/02/23 11:57 PM   Specimen: Urine, Clean Catch; Nasal Swab  Result Value Ref Range Status   MRSA by PCR Next Gen NOT DETECTED NOT DETECTED Final    Comment: (NOTE) The GeneXpert MRSA Assay (FDA approved for NASAL specimens only), is one component of a comprehensive MRSA colonization surveillance program. It is not intended to diagnose MRSA infection nor to guide or monitor treatment for MRSA infections. Test performance is not FDA approved in patients less than 44 years old. Performed at [facility], [address] [place], [place] [address]   Resp panel by RT-PCR (RSV, Flu A&B, Covid) Anterior Nasal Swab     Status: None   Collection Time: 07/03/23  4:56 PM   Specimen: Anterior Nasal Swab  Result Value Ref Range Status   SARS Coronavirus 2 by RT PCR NEGATIVE NEGATIVE Final    Comment: (NOTE) SARS-CoV-2 target nucleic acids are NOT DETECTED.  The SARS-CoV-2 RNA is generally detectable in upper respiratory specimens during the acute phase of infection. The lowest concentration of SARS-CoV-2 viral copies this assay can detect is 138 copies/mL. A negative result does not preclude SARS-Cov-2 infection and should not be used as the sole basis for treatment or other patient management  decisions. A negative result may occur with  improper specimen collection/handling, submission of specimen other than nasopharyngeal swab, presence of viral mutation(s) within the areas targeted by this assay, and inadequate number of viral copies(<138 copies/mL). A negative result must be combined with clinical observations, patient history, and epidemiological information. The expected result is Negative.  Fact Sheet for Patients:  BloggerCourse.com  Fact Sheet for Healthcare Providers:  SeriousBroker.it  This test is no t yet approved or cleared by the [place]  FDA and  has been authorized for detection and/or diagnosis of SARS-CoV-2 by FDA under an Emergency Use Authorization (EUA). This EUA will remain  in effect (meaning this test can be used) for the duration of the COVID-19 declaration under Section 564(b)(1) of the Act, 21 U.S.C.section 360bbb-3(b)(1), unless the authorization is terminated  or revoked sooner.       Influenza A by PCR NEGATIVE NEGATIVE Final   Influenza B by PCR NEGATIVE NEGATIVE Final    Comment: (NOTE) The Xpert Xpress SARS-CoV-2/FLU/RSV plus assay is intended as an aid in the diagnosis of influenza from Nasopharyngeal swab specimens and should not be used as a sole basis for treatment. Nasal washings and aspirates are unacceptable for Xpert Xpress SARS-CoV-2/FLU/RSV testing.  Fact Sheet for Patients: BloggerCourse.com  Fact Sheet for Healthcare Providers: SeriousBroker.it  This test is not yet approved or cleared by the [place]  FDA and has been authorized for detection and/or diagnosis of SARS-CoV-2 by FDA under an Emergency Use Authorization (EUA). This EUA will remain in effect (meaning this test can be used) for the duration of the COVID-19 declaration under Section 564(b)(1) of the Act, 21 U.S.C. section 360bbb-3(b)(1), unless the  authorization is terminated or revoked.     Resp Syncytial Virus by PCR NEGATIVE NEGATIVE Final    Comment: (NOTE) Fact Sheet for Patients: BloggerCourse.com  Fact Sheet for Healthcare Providers: SeriousBroker.it  This test is not yet approved or cleared by the [place]  FDA and has been authorized for detection and/or diagnosis of SARS-CoV-2 by FDA under an Emergency Use Authorization (EUA). This EUA will remain in effect (meaning this test can be used) for the duration of the COVID-19 declaration under Section 564(b)(1) of the Act, 21 U.S.C. section 360bbb-3(b)(1), unless the authorization is terminated or revoked.  Performed at [facility], [address], [place], [place] [address]   Respiratory (~20 pathogens) panel by PCR     Status: None   Collection Time: 07/03/23  4:56 PM   Specimen: Nasopharyngeal Swab; Respiratory  Result Value Ref Range Status   Adenovirus NOT DETECTED NOT DETECTED Final   Coronavirus 229E NOT DETECTED NOT DETECTED Final    Comment: (NOTE) The Coronavirus on the Respiratory Panel, DOES NOT test for the novel  Coronavirus (2019 nCoV)    Coronavirus HKU1 NOT DETECTED NOT DETECTED Final   Coronavirus NL63 NOT DETECTED NOT DETECTED Final   Coronavirus OC43 NOT DETECTED NOT DETECTED Final   Metapneumovirus NOT DETECTED NOT DETECTED Final   Rhinovirus / Enterovirus NOT DETECTED NOT DETECTED Final   Influenza A NOT DETECTED NOT DETECTED Final   Influenza B NOT DETECTED NOT DETECTED Final   Parainfluenza Virus 1 NOT  DETECTED NOT DETECTED Final   Parainfluenza Virus 2 NOT DETECTED NOT DETECTED Final   Parainfluenza Virus 3 NOT DETECTED NOT DETECTED Final   Parainfluenza Virus 4 NOT DETECTED NOT DETECTED Final   Respiratory Syncytial Virus NOT DETECTED NOT DETECTED Final   Bordetella pertussis NOT DETECTED NOT DETECTED Final   Bordetella Parapertussis NOT DETECTED NOT DETECTED  Final   Chlamydophila pneumoniae NOT DETECTED NOT DETECTED Final   Mycoplasma pneumoniae NOT DETECTED NOT DETECTED Final    Comment: Performed at [facility], [address] [place], [place] [address]      Radiology Studies: MR LUMBAR SPINE W WO CONTRAST Result Date: 07/05/2023 CLINICAL DATA:  Initial evaluation for acute back pain, metastatic disease evaluation. EXAM: MRI LUMBAR SPINE WITHOUT AND WITH CONTRAST TECHNIQUE: Multiplanar and multiecho pulse sequences of the lumbar spine were obtained without and with intravenous contrast. CONTRAST:  10mL GADAVIST  GADOBUTROL  1 MMOL/ML IV SOLN COMPARISON:  Prior study from 06/06/2023 FINDINGS: Segmentation: Standard. Lowest well-formed disc space labeled the L5-S1 level. Alignment: Physiologic with preservation of the normal lumbar lordosis. No listhesis. Vertebrae: Acute compression fracture extending through the inferior endplate of L2 with associated marrow edema and enhancement, new from prior. This is superimposed on underlying chronic L2 compression fracture. Associated height loss measures up to 50-60% without significant bony retropulsion this is benign/mechanical in appearance. Additional chronic compression deformities involving the T12, L1, L3, L4, and L5 vertebral bodies again seen, otherwise stable. Sequelae of prior vertebral augmentation at T12, L1, and L3. Underlying bone marrow signal intensity within normal limits. No worrisome osseous lesions or evidence for metastatic disease. No other abnormal enhancement. Conus medullaris and cauda equina: Conus extends to the T12 level. Conus and cauda equina appear normal. Paraspinal and other soft tissues: Paraspinous soft tissues demonstrate no acute finding. 1.1 cm exophytic left renal cyst noted, benign in appearance, no follow-up imaging recommended. 3.7 cm intra-abdominal aortic aneurysm noted. Disc levels: L1-2: Disc desiccation with diffuse disc bulge. Mild bilateral facet spurring. No  significant spinal stenosis. Foramina remain patent. L2-3: Diffuse disc bulge with disc desiccation. Mild bilateral facet hypertrophy. Mild prominence of the dorsal epidural fat. Resultant mild spinal stenosis with mild bilateral foraminal narrowing. L3-4: Disc desiccation with mild diffuse disc bulge. Superimposed left foraminal to extraforaminal disc protrusion contacts the exiting left L3 nerve root. Mild facet and ligament flavum hypertrophy. No more than mild spinal stenosis. Mild bilateral L3 foraminal narrowing. L4-5: Disc desiccation with mild disc bulge. Superimposed broad base left subarticular to foraminal disc protrusion (series 5, image 26). Mild facet and ligament flavum hypertrophy. Resultant moderate canal with left greater than right lateral recess stenosis. Mild to moderate bilateral L4 foraminal narrowing. L5-S1: Disc desiccation with mild disc bulge. Small posterior annular fissure. Moderate left with mild right facet hypertrophy. No significant spinal stenosis. Foramina remain adequately patent. IMPRESSION: 1. Acute compression fracture involving the inferior endplate of L2 with up to 60% height loss without significant bony retropulsion. This is superimposed on an underlying chronic L2 compression fracture, and is benign/mechanical in appearance. No evidence for metastatic disease within the lumbar spine. 2. Additional chronic compression deformities involving the T12, L1, L3, L4, and L5 vertebral bodies, otherwise stable. 3. Multifactorial degenerative changes at L4-5 with resultant moderate canal and left greater than right lateral recess stenosis, with mild to moderate bilateral L4 foraminal narrowing. 4. Left foraminal to extraforaminal disc protrusion at L3-4, potentially affecting the exiting left L3 nerve root. 5. 3.7 cm intra-abdominal aortic aneurysm. Recommend  follow-up every 2 years. This recommendation follows ACR consensus guidelines: White Paper of the ACR Incidental Findings  Committee II on Vascular Findings. J Am Coll Radiol 2013; 10:789-794. Electronically Signed   By: Virgia Griffins M.D.   On: 07/05/2023 02:20   MR THORACIC SPINE W WO CONTRAST Result Date: 07/05/2023 CLINICAL DATA:  Initial evaluation for severe back pain, metastatic disease evaluation EXAM: MRI THORACIC WITHOUT AND WITH CONTRAST TECHNIQUE: Multiplanar and multiecho pulse sequences of the thoracic spine were obtained without and with intravenous contrast. CONTRAST:  10mL GADAVIST  GADOBUTROL  1 MMOL/ML IV SOLN COMPARISON:  Prior studies from 07/02/2023 and earlier. FINDINGS: Alignment: Straightening of the normal thoracic kyphosis with underlying mild dextroscoliosis. No listhesis. Vertebrae: Compression deformity involving the superior endplate of T11 with mild residual marrow edema and enhancement, consistent with an evolving subacute fracture. Height loss is not significantly changed or progressed as compared to prior MRI from 06/06/2023. This is benign/mechanical in appearance. Additional chronic compression deformities involving the T12 and L1 vertebral bodies with sequelae of prior vertebral augmentation, stable. Vertebral body height otherwise maintained with no other acute or interval fracture. Bone marrow signal intensity within normal limits. No worrisome osseous lesions or evidence for metastatic disease. No other abnormal enhancement. Cord:  Normal signal and morphology.  No abnormal enhancement. Paraspinal and other soft tissues: Paraspinous soft tissues demonstrate no acute finding. Post treatment changes with fibrotic changes noted within the partially visualized right lung. Known right adrenal metastasis noted, grossly similar to prior CT. Disc levels: T6-7: Small left paracentral disc protrusion indents the ventral thecal sac. No spinal stenosis. Foramina remain patent. T7-8: Central disc protrusion indents the ventral thecal sac. Mild flattening of the ventral cord without cord signal changes  or significant spinal stenosis. Foramina remain patent. T10-11: Mild disc bulge with facet hypertrophy. No canal stenosis. Moderate right with mild left foraminal stenosis. Otherwise, minor for age spondylosis elsewhere within the thoracic spine. No other significant stenosis or impingement. IMPRESSION: 1. No evidence for metastatic disease within the thoracic spine. 2. Evolving subacute compression fracture involving the superior endplate of T11 with no more than mild residual marrow edema. Height loss is not significantly changed or progressed as compared to prior MRI from 06/06/2023. 3. Chronic compression deformities involving the T12 and L1 vertebral bodies with sequelae of prior vertebral augmentation, stable. 4. Small disc protrusions at T6-7 and T7-8 without significant stenosis. Moderate right with mild left foraminal stenosis at T10-11. 5. Post treatment changes within the visualized right lung with known right adrenal metastasis, described on recent CTs. Electronically Signed   By: Virgia Griffins M.D.   On: 07/05/2023 02:05   DG HIP UNILAT WITH PELVIS 2-3 VIEWS LEFT Result Date: 07/04/2023 CLINICAL DATA:  Left hip pain. EXAM: DG HIP (WITH OR WITHOUT PELVIS) 2-3V LEFT COMPARISON:  KUB 07/03/2023, CT chest, abdomen, and pelvis 06/23/2023 FINDINGS: There is diffuse decreased bone mineralization. The bilateral femoroacetabular joint spaces are maintained. Mild bilateral superolateral acetabular degenerative osteophytosis. The bilateral sacroiliac and pubic symphysis joint spaces are maintained. No acute fracture or dislocation. L3 vertebral body and partially visualized L1 vertebral body augmentation cement again noted. Mild to moderate atherosclerotic calcifications. IMPRESSION: Mild bilateral femoroacetabular osteoarthritis. Electronically Signed   By: Bertina Broccoli M.D.   On: 07/04/2023 09:23   DG Abd 1 View Result Date: 07/03/2023 CLINICAL DATA:  Constipation. Patient reports left bowel  movement was 5 days ago. EXAM: ABDOMEN - 1 VIEW COMPARISON:  CT 06/23/2023 FINDINGS: Diminished stool burden from prior  CT. No significant formed stool throughout the colon. There is scattered air throughout nondilated small bowel in the central abdomen. Cholecystectomy clips in the right upper quadrant. Excreted IV contrast in the urinary bladder from yesterday's chest CTA. Multilevel vertebral augmentation. IMPRESSION: 1. No significant formed stool in the colon. 2. Air scattered throughout nondilated small bowel, query ileus. Electronically Signed   By: Chadwick Colonel M.D.   On: 07/03/2023 14:55      Scheduled Meds:  amiodarone   200 mg Oral Daily   budesonide -glycopyrrolate -formoterol   2 puff Inhalation BID   feeding supplement  237 mL Oral TID BM   fentaNYL   1 patch Transdermal Q72H   gabapentin   100 mg Oral QHS   HYDROmorphone   2 mg Oral Q4H   lidocaine   1 patch Transdermal Q24H   metoprolol  succinate  25 mg Oral Daily   PARoxetine   10 mg Oral QHS   polyethylene glycol  17 g Oral Daily   predniSONE   5 mg Oral Q breakfast   senna-docusate  2 tablet Oral BID   sodium chloride   1 g Oral BID WC   Continuous Infusions:  ceFEPime  (MAXIPIME ) IV 2 g (07/05/23 1015)   heparin        LOS: 3 days   Time spent: 25 minutes   Daren Eck, DO [facility] 07/05/2023, 10:59 AM   Available via Epic secure chat 7am-7pm After these hours, please refer to coverage provider listed on [email or web address]

## 2023-07-05 NOTE — Progress Notes (Addendum)
 PT Cancellation Note  Patient Details Name: John [name] MRN: [id number] DOB: [birth year]   Cancelled Treatment:    Reason Eval/Treat Not Completed: Pain limiting ability to participate. Spoke with RN-pt still in quite a bit of pain-recommended therapy be held on today. RN reports IR consulting for KP. Will likely have to hold PT until after KP and/or pain under better control.   Tanda Falter, PT Acute Rehabilitation  Office: [phone]

## 2023-07-05 NOTE — Progress Notes (Signed)
 PHARMACY - ANTICOAGULATION CONSULT NOTE  Pharmacy Consult for Eliquis  >> heparin  Indication: atrial fibrillation  Allergies  Allergen Reactions   Ventolin  [Albuterol ] Other (See Comments)    Patient has had episode of atrial fib following administration of albuterol  (tolerates Xopenex  well)   Lipitor [Atorvastatin ] Other (See Comments)    Arthralgias    Oxycodone  Other (See Comments)    Severe Migranes    Patient Measurements: Height: 5\' 11"  (180.3 cm) Weight: 106.1 kg (234 lb) IBW/kg (Calculated) : 75.3 HEPARIN  DW (KG): 97.7  Vital Signs: Temp: 98.2 F (36.8 C) (05/20 1219) Temp Source: Oral (05/20 1219) BP: 116/68 (05/20 1219) Pulse Rate: 98 (05/20 1219)  Labs: Recent Labs    07/03/23 0500 07/04/23 0331 07/04/23 1318 07/05/23 0428  HGB 11.4* 11.4*  --  11.2*  HCT 32.8* 31.8*  --  33.4*  PLT 211 188  --  205  LABPROT  --   --  17.0*  --   INR  --   --  1.4*  --   CREATININE 0.83 0.78  --  0.90    Estimated Creatinine Clearance: 106.8 mL/min (by C-G formula based on SCr of 0.9 mg/dL).   Medical History: Past Medical History:  Diagnosis Date   Arthritis    Rheumatoid   Bacteremia due to Pseudomonas    C. difficile colitis    Chronic anxiety 11/08/2014   Cough 09/22/2021   COVID 2020   mild case   Depression    Drug-induced neutropenia (HCC) 08/11/2020   Family history of adverse reaction to anesthesia    brother with seizures had episode under anesthesia.  Patient has seizures as well.   GERD (gastroesophageal reflux disease)    History of radiation therapy 04/24/2020-05/16/2020   IMRT to right lung     Dr Retta Caster   History of radiation therapy    08/10/21-08/19/21-Dr. Retta Caster   History of radiation therapy    Abdomen- 03/21/23-03/31/23-Dr. Retta Caster   Neutropenia [facility]) 05/12/2020   Neutropenic fever (HCC) 05/12/2020   Normocytic anemia 04/18/2021   PAF (paroxysmal atrial fibrillation) (HCC)    CHADS2VSAC score 3   Rheumatoid aortitis     Secondary hypercoagulable state (HCC) 10/28/2020   Sepsis (HCC) 10/06/2020   Shortness of breath 12/18/2020   Squamous cell lung cancer [facility])      Assessment: 63 YO male presenting with worsening back pain and shortness of breath. MRI spine showed osteoporotic compression fractures. Oncology planning for possible vertebroplasty. Patient currently on PTA Eliquis  for atrial fibrillation--last dose was 5mg  on 5/19 PM. Pharmacy consulted for heparin  dosing, pending vertebroplasty procedure.   Today, 07/05/23: aPTT 76, therapeutic on heparin  1400 units/hr Heparin  level >1.1 (falsely elevated heparin  level from recent DOAC) RN reports no bleeding, complications, or interruptions.     Goal of Therapy:  Heparin  level 0.3-0.7 units/ml aPTT 66-102 seconds Monitor platelets by anticoagulation protocol: Yes   Plan:  Continue heparin  IV infusion at 1400 units/hr Check confirmatory aPTT in 6 hours  Check heparin  level, aPTT, and CBC daily F/u plans for holding heparin  around kyphoplasty - tentatively scheduled for IR on Thursday 07/07/23.    Kendall Pauls PharmD, BCPS [facility] main pharmacy [phone] 07/05/2023 5:13 PM

## 2023-07-05 NOTE — Progress Notes (Addendum)
 Palliative Medicine Inpatient Follow Up Note HPI: John [name] is a 63 y.o. 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  Palliative care has been asked to support symptom needs.   Today's Discussion 07/05/2023  *Please note that this is a verbal dictation therefore any spelling or grammatical errors are due to the "Dragon [employer] One" system interpretation.  Chart reviewed inclusive of vital signs, progress notes, laboratory results, and diagnostic images.   I met with John [name] at bedside this morning in the presence of his wife, John [name]. He shares that he again - did well until the early morning hours when his pain ticked up due to not getting dilaudid . He is in agreement with scheduling his dilaudid  during this time.  He shares that he is okay with being woken up for this as well. He feels that he has had improvement in his pain to the point whereby he can function again. He and his wife are anxious to hear from the interventional radiology team to understand what interventions will be pursued for his L2 compression fracture.   John [name] shares relief that his fracture is not pathological or malignant.   John [name] denies bloating, nausea, he shares his last bowel movement was two days ago. He understands the importance of regular bowel patterns.   John [name] shares that he sleeps well until the pain flares up.   John [name] and his wife wee able to speak to chaplain support - Amy Burris yesterday which they found to be of great help.   Questions and concerns addressed/Palliative Support Provided.   Objective Assessment: Vital  Signs Vitals:   07/05/23 0916 07/05/23 1009  BP:  99/69  Pulse:  91  Resp:    Temp:    SpO2: 95% 96%    Intake/Output Summary (Last 24 hours) at 07/05/2023 1014 Last data filed at 07/05/2023 1000 Gross per 24 hour  Intake 611.41 ml  Output 1800 ml  Net -1188.59 ml   Last Weight  Most recent update: 07/03/2023  1:55 AM    Weight  106.1 kg (234 lb)            Gen:  Older Caucasian M in distress HEENT: moist mucous membranes CV: Regular rate and irregular rhythm  PULM: On 4LPM [place], breathing is not labored ABD: soft/nontender  EXT: No edema  Neuro: Alert and oriented x3   SUMMARY OF RECOMMENDATIONS   Full Code / Full Scope of Care   Advanced Directives provided for review and completion  Ongoing chaplain support   [facility] to [facility] symptoms more comprehensively - have messaged Athena Cousar and Mayagan Arlena Lacrosse in regards to this   Symptom Management: Lower Back Pain: - Continue fentanyl  patch 25mch/hr - Continue dilaudid  2mg  PO Q6H standing - patient okay being awoken at night  - Continue dilaudid  2mg  PO Q4H PRN  for moderation pain - Continue dilaudid  1mg  IVP Q4H PRN severe breakthrough pain - Continue Lidoderm  patch (12 hours on 12 hours off) - Continue Methocarbamol  1g IV Q6H PRN - Continue gabapentin  100mg  PO at bedtime - Appreciate IR evaluation   Constipation: - Senna 2 Tabs  PO BID - Miralax  17g Daily - Bisacodyl  suppository 10mg  PR Qday PRN   Anxiety/Depression: - Continue Paxil   ______________________________________________________________________________________ Camille Cedars  Palliative Medicine Team Team Cell Phone: [phone] Please utilize secure chat with additional questions, if there is no response within 30 minutes please call the above phone number  Time: 22 Billing based on MDM: High in the setting of review and modification of parenteral opioids   Palliative  Medicine Team providers are available by phone from 7am to 7pm daily and can be reached through the team cell phone.  Should this patient require assistance outside of these hours, please call the patient's attending physician.

## 2023-07-06 ENCOUNTER — Encounter (HOSPITAL_COMMUNITY): Payer: Self-pay | Admitting: Student

## 2023-07-06 DIAGNOSIS — Z515 Encounter for palliative care: Secondary | ICD-10-CM | POA: Diagnosis not present

## 2023-07-06 DIAGNOSIS — G893 Neoplasm related pain (acute) (chronic): Secondary | ICD-10-CM | POA: Diagnosis not present

## 2023-07-06 DIAGNOSIS — J9621 Acute and chronic respiratory failure with hypoxia: Secondary | ICD-10-CM | POA: Diagnosis not present

## 2023-07-06 DIAGNOSIS — I480 Paroxysmal atrial fibrillation: Secondary | ICD-10-CM

## 2023-07-06 DIAGNOSIS — Z7189 Other specified counseling: Secondary | ICD-10-CM | POA: Diagnosis not present

## 2023-07-06 DIAGNOSIS — E871 Hypo-osmolality and hyponatremia: Secondary | ICD-10-CM | POA: Diagnosis not present

## 2023-07-06 DIAGNOSIS — M549 Dorsalgia, unspecified: Secondary | ICD-10-CM | POA: Diagnosis not present

## 2023-07-06 LAB — CBC WITH DIFFERENTIAL/PLATELET
Abs Immature Granulocytes: 0.02 10*3/uL (ref 0.00–0.07)
Basophils Absolute: 0 10*3/uL (ref 0.0–0.1)
Basophils Relative: 2 %
Eosinophils Absolute: 0.1 10*3/uL (ref 0.0–0.5)
Eosinophils Relative: 6 %
HCT: 30.8 % — ABNORMAL LOW (ref 39.0–52.0)
Hemoglobin: 10.1 g/dL — ABNORMAL LOW (ref 13.0–17.0)
Immature Granulocytes: 2 %
Lymphocytes Relative: 23 %
Lymphs Abs: 0.3 10*3/uL — ABNORMAL LOW (ref 0.7–4.0)
MCH: 30.1 pg (ref 26.0–34.0)
MCHC: 32.8 g/dL (ref 30.0–36.0)
MCV: 91.9 fL (ref 80.0–100.0)
Monocytes Absolute: 0.7 10*3/uL (ref 0.1–1.0)
Monocytes Relative: 53 %
Neutro Abs: 0.2 10*3/uL — CL (ref 1.7–7.7)
Neutrophils Relative %: 14 %
Platelets: 211 10*3/uL (ref 150–400)
RBC: 3.35 MIL/uL — ABNORMAL LOW (ref 4.22–5.81)
RDW: 19.2 % — ABNORMAL HIGH (ref 11.5–15.5)
WBC: 1.3 10*3/uL — CL (ref 4.0–10.5)
nRBC: 0 % (ref 0.0–0.2)

## 2023-07-06 LAB — BASIC METABOLIC PANEL WITH GFR
Anion gap: 6 (ref 5–15)
BUN: 17 mg/dL (ref 8–23)
CO2: 19 mmol/L — ABNORMAL LOW (ref 22–32)
Calcium: 7.9 mg/dL — ABNORMAL LOW (ref 8.9–10.3)
Chloride: 103 mmol/L (ref 98–111)
Creatinine, Ser: 0.76 mg/dL (ref 0.61–1.24)
GFR, Estimated: >60 mL/min (ref >60)
Glucose, Bld: 125 mg/dL — ABNORMAL HIGH (ref 70–99)
Potassium: 3.4 mmol/L — ABNORMAL LOW (ref 3.5–5.1)
Sodium: 128 mmol/L — ABNORMAL LOW (ref 135–145)

## 2023-07-06 LAB — APTT: aPTT: 94 s — ABNORMAL HIGH (ref 24–36)

## 2023-07-06 MED ORDER — FENTANYL 25 MCG/HR TD PT72
1.0000 | MEDICATED_PATCH | TRANSDERMAL | Status: DC
  Administered 2023-07-06: 1 via TRANSDERMAL
  Filled 2023-07-06: qty 1

## 2023-07-06 MED ORDER — POTASSIUM CHLORIDE CRYS ER 20 MEQ PO TBCR
40.0000 meq | EXTENDED_RELEASE_TABLET | ORAL | Status: AC
  Administered 2023-07-06 (×2): 40 meq via ORAL
  Filled 2023-07-06 (×2): qty 2

## 2023-07-06 MED ORDER — FENTANYL 12 MCG/HR TD PT72
1.0000 | MEDICATED_PATCH | TRANSDERMAL | Status: DC
  Administered 2023-07-06: 1 via TRANSDERMAL
  Filled 2023-07-06: qty 1

## 2023-07-06 MED ORDER — VITAMIN D (ERGOCALCIFEROL) 1.25 MG (50000 UNIT) PO CAPS
50000.0000 [IU] | ORAL_CAPSULE | ORAL | Status: DC
  Administered 2023-07-06 – 2023-07-20 (×3): 50000 [IU] via ORAL
  Filled 2023-07-06 (×3): qty 1

## 2023-07-06 NOTE — Plan of Care (Signed)
  Problem: Education: Goal: Knowledge of General Education information will improve Description: Including pain rating scale, medication(s)/side effects and non-pharmacologic comfort measures Outcome: Progressing   Problem: Health Behavior/Discharge Planning: Goal: Ability to manage health-related needs will improve Outcome: Progressing   Problem: Clinical Measurements: Goal: Ability to maintain clinical measurements within normal limits will improve Outcome: Progressing Goal: Will remain free from infection Outcome: Progressing Goal: Diagnostic test results will improve Outcome: Progressing Goal: Respiratory complications will improve Outcome: Progressing Goal: Cardiovascular complication will be avoided Outcome: Progressing   Problem: Activity: Goal: Risk for activity intolerance will decrease Outcome: Progressing   Problem: Nutrition: Goal: Adequate nutrition will be maintained Outcome: Progressing   Problem: Coping: Goal: Level of anxiety will decrease Outcome: Progressing Level of anxiety decreased with better pain managment   Problem: Elimination: Goal: Will not experience complications related to bowel motility Outcome: Progressing Goal: Will not experience complications related to urinary retention Outcome: Progressing   Problem: Pain Managment: Goal: General experience of comfort will improve and/or be controlled Outcome: Progressing   Problem: Safety: Goal: Ability to remain free from injury will improve Outcome: Progressing   Problem: Skin Integrity: Goal: Risk for impaired skin integrity will decrease Outcome: Progressing

## 2023-07-06 NOTE — Assessment & Plan Note (Addendum)
 07-06-2023 continue with IV heparin  gtts pending his kyphoplasty. On Toprol  XL  07-07-2023 stable. Remains on IV heparin  gtts.  07-08-2023 continue IV heparin  gtts. Awaiting pt to complete kyphoplasty prior to restarting po Eliquis   07-09-2023 eliquis  restarted last night. Stable.  07-10-2023 stable on Eliquis .  07-11-2023 stable on Eliquis , toprol -XL 25 mg daily.  07-12-2023 stable on Eliquis  and Toprol -XL

## 2023-07-06 NOTE — Progress Notes (Signed)
 Palliative Medicine Inpatient Follow Up Note HPI: John [name] is a 63 y.o. 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  Palliative care has been asked to support symptom needs.   Today's Discussion 07/06/2023  *Please note that this is a verbal dictation therefore any spelling or grammatical errors are due to the "Dragon Medical One" system interpretation.  Chart reviewed inclusive of vital signs, progress notes, laboratory results, and diagnostic images. (+) 80 additional MME orally.   I met with John [name] at bedside this morning.  We discussed that he had the best night of pain relief since he first came in. We reviewed that he felt his nurse was incredible in regards to providing medication and curtailing his regiment.   John [name] and I discussed that he will get his kyphoplasty tomorrow. He shares the hopes that this further controls his pain.  John [name] notes that he is "ready" to have a bowel movement this morning.   John [name] denies nausea this morning.   Questions and concerns addressed/Palliative Support Provided.   Objective Assessment: Vital Signs Vitals:   07/06/23 0416 07/06/23 0850  BP: 110/75   Pulse: 90   Resp: 18   Temp: 98 F (36.7 C)   SpO2: 94% 92%    Intake/Output Summary (Last 24 hours) at 07/06/2023 0900 Last data filed at 07/06/2023 0820 Gross per 24 hour  Intake 2128.97 ml  Output 1375 ml  Net 753.97 ml   Last Weight  Most recent update: 07/03/2023  1:55 AM    Weight  106.1 kg (234 lb)            Gen:  Older Caucasian M in distress HEENT: moist mucous membranes CV: Regular  rate and irregular rhythm  PULM: On 3LPM [place], breathing is not labored ABD: soft/nontender  EXT: No edema  Neuro: Alert and oriented x3   SUMMARY OF RECOMMENDATIONS   Full Code / Full Scope of Care   Advanced Directives provided for review and completion  Ongoing chaplain support   Will [facility] to support symptoms more comprehensively - have messaged Athena Cousar and Mayagan Arlena Lacrosse in regards to this   Symptom Management: Lower Back Pain: - Increase fentanyl  patch to 37mcg/hr - Continue dilaudid  2mg  PO Q6H standing - patient okay being awoken at night  - Continue dilaudid  2mg  PO Q4H PRN  for moderation pain - Continue dilaudid  1mg  IVP Q4H PRN severe breakthrough pain - Continue Lidoderm  patch (12 hours on 12 hours off) - Continue Methocarbamol  1g IV Q6H PRN - Continue gabapentin  100mg  PO at bedtime - Appreciate IR intervention tomorrow - Coordination with the pharmacy team - Roselee Cong   Constipation: - Senna 2 Tabs PO BID - Miralax  17g Daily - Bisacodyl  suppository 10mg  PR Qday PRN   Anxiety/Depression: - Continue Paxil   ______________________________________________________________________________________ Camille Cedars [place] Palliative Medicine Team Team Cell Phone: [phone] Please utilize secure chat with additional questions, if there is no response within 30 minutes please call the above phone number  Billing based on MDM: High in the setting of review and modification of parenteral opioids   Palliative Medicine Team providers are  available by phone from 7am to 7pm daily and can be reached through the team cell phone.  Should this patient require assistance outside of these hours, please call the patient's attending physician.

## 2023-07-06 NOTE — Progress Notes (Signed)
 PHARMACY - ANTICOAGULATION CONSULT NOTE  Pharmacy Consult for Eliquis  >> heparin  Indication: atrial fibrillation  Allergies  Allergen Reactions   Ventolin  [Albuterol ] Other (See Comments)    Patient has had episode of atrial fib following administration of albuterol  (tolerates Xopenex  well)   Lipitor [Atorvastatin ] Other (See Comments)    Arthralgias    Oxycodone  Other (See Comments)    Severe Migranes    Patient Measurements: Height: 5\' 11"  (180.3 cm) Weight: 106.1 kg (234 lb) IBW/kg (Calculated) : 75.3 HEPARIN  DW (KG): 97.7  Vital Signs: Temp: 98.1 F (36.7 C) (05/20 1934) Temp Source: Oral (05/20 1934) BP: 105/58 (05/20 1934) Pulse Rate: 94 (05/20 1934)  Labs: Recent Labs    07/04/23 0331 07/04/23 1318 07/05/23 0428 07/05/23 1815 07/06/23 0257  HGB 11.4*  --  11.2*  --  10.1*  HCT 31.8*  --  33.4*  --  30.8*  PLT 188  --  205  --  211  APTT  --   --   --  76* 94*  LABPROT  --  17.0*  --   --   --   INR  --  1.4*  --   --   --   HEPARINUNFRC  --   --   --  >1.10*  --   CREATININE 0.78  --  0.90  --  0.76    Estimated Creatinine Clearance: 120.1 mL/min (by C-G formula based on SCr of 0.76 mg/dL).   Medical History: Past Medical History:  Diagnosis Date   Arthritis    Rheumatoid   Bacteremia due to Pseudomonas    C. difficile colitis    Chronic anxiety 11/08/2014   Cough 09/22/2021   COVID 2020   mild case   Depression    Drug-induced neutropenia (HCC) 08/11/2020   Family history of adverse reaction to anesthesia    brother with seizures had episode under anesthesia.  Patient has seizures as well.   GERD (gastroesophageal reflux disease)    History of radiation therapy 04/24/2020-05/16/2020   IMRT to right lung     Dr Retta Caster   History of radiation therapy    08/10/21-08/19/21-Dr. Retta Caster   History of radiation therapy    Abdomen- 03/21/23-03/31/23-Dr. Retta Caster   Neutropenia [facility]) 05/12/2020   Neutropenic fever (HCC) 05/12/2020    Normocytic anemia 04/18/2021   PAF (paroxysmal atrial fibrillation) (HCC)    CHADS2VSAC score 3   Rheumatoid aortitis    Secondary hypercoagulable state (HCC) 10/28/2020   Sepsis (HCC) 10/06/2020   Shortness of breath 12/18/2020   Squamous cell lung cancer [facility])      Assessment: 63 YO male presenting with worsening back pain and shortness of breath. MRI spine showed osteoporotic compression fractures. Oncology planning for possible vertebroplasty. Patient currently on PTA Eliquis  for atrial fibrillation--last dose was 5mg  on 5/19 PM. Pharmacy consulted for heparin  dosing, pending vertebroplasty procedure.   Today, 07/06/23: aPTT 94, therapeutic on heparin  1400 units/hr Hgb 10.1, Pltc wnl No bleeding complications, or infusion interruptions reported   Goal of Therapy:  Heparin  level 0.3-0.7 units/ml aPTT 66-102 seconds Monitor platelets by anticoagulation protocol: Yes   Plan:  Continue heparin  IV infusion at 1400 units/hr Check heparin  level, aPTT, and CBC daily F/u plans for holding heparin  around kyphoplasty - tentatively scheduled for IR on Thursday 07/07/23.    Cillian Gwinner, PharmD 07/06/2023 3:53 AM

## 2023-07-06 NOTE — Assessment & Plan Note (Addendum)
 07-06-2023 - Currently under active therapy with  Gemzar  and Navelbine  - Followed by Dr. Maria Shiner  07-07-2023 stable.  07-08-2023 oncology following.  07-09-2023 followed by oncology  07-10-2023 followed by oncology  07-11-2023 oncology following.  07-12-2023 stable.

## 2023-07-06 NOTE — Assessment & Plan Note (Addendum)
 07-06-2023 Na stable. Na 128. - Urine osmole 575  - Urine sodium 129 - Salt tabs and fluid restriction - Improved  07-07-2023 Na 130 today.  07-08-2023 stable.  07-09-2023 stable. Na 131 today.  07-11-2023 Na 132. Stable.

## 2023-07-06 NOTE — Assessment & Plan Note (Addendum)
 07-06-2023 Na stable. Na 128. - Urine osmole 575  - Urine sodium 129 - Salt tabs and fluid restriction - Improved  07-07-2023 Na 130 today.   07-09-2023 stable. Na 131 today.  07-11-2023 Na 132. Stable.

## 2023-07-06 NOTE — Progress Notes (Signed)
 PROGRESS NOTE    John [name]  ZOX:[id number] DOB: 02-27-1960 DOA: 07/02/2023 PCP: Patient, No Pcp Per  Subjective: Pt seen and examined. Was on schedule for have kyphoplasty tomorrow. Just notified by IR that pt's insurance company wants more information regarding his back pain.   Hospital Course: HPI: John [name] is a 63 y.o. 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  Patient reports she has had back pain for months but the pain was too severe today to tolerate.  His home Percocet is not controlling his pain.  He describes the pain as sharp in his low back, radiating to both legs. He also reports difficulty breathing today.  On EMS arrival, patient was found to have SpO2 of 88% which improved with 6 L [place]. He reports difficulty walking due to the pain, poor appetite and feeling thirsty but denies any fevers, chills, cough, nausea, vomiting, abdominal pain, headache or dizziness.   ED Course: Initial vitals show temp 98.1, RR 19, HR 90, BP 161/101 and SpO2 95% on 6 L [place].  Labs significant for sodium 124, K+ 3.3, creatinine 0.87, WBC 1.3 (from 4.3 1-day ago), Hgb 11.3, platelet 175, mag 1.8, Phos 2.4. CXR shows right basilar consolidation concerning for acute airway disease versus atelectasis. CTA PE study negative for PE but shows signs of possible multifocal pneumonia. Patient received IV morphine  1 mg x 2, IV ketamine  32 mg x 1, Toradol  15 mg x 1, IV NS 500 cc x 1, IV cefepime  and IV vancomycin . TRH was consulted for admission.  Significant Events: Admitted 07/02/2023 acute on chronic respiratory failure with  hypoxia   Significant Labs: Covid/flu/RSV negative RVP negative Na 124, K 3.5, CO2 of 19, BUN 14, Scr 0.78, glu 105 INR 1.4 WBC 1.0, HgB 11.2, plt 205 Legionella urinary antigen Negative Strep Pneumo antigen Negative  Significant Imaging Studies: CXR Increasing right basilar consolidation superimposed upon chronic post radiation fibrosis, which may reflect acute airspace disease or atelectasis. CTPA No evidence for pulmonary embolism. 2. Stable multifocal areas of ground-glass opacity throughout both lungs along with some peripheral reticular opacities. Findings are favored as infectious/inflammatory. 3. Stable focal airspace opacity in the right perihilar region and superior segment of the right lower lobe with some central cavitation. Findings may be  infectious/inflammatory. 4. Stable 3.3 cm infrarenal abdominal aortic aneurysm. Recommend follow-up ultrasound every 3 years. Aortic Atherosclerosis (ICD10-I70.0) and Emphysema (ICD10-J43.9)  CT T-spine Chronic compression deformities from T10 through L2, with evidence of prior vertebral augmentation at T12 and L1. No acute thoracic spine fracture. Abd XR No significant formed stool in the colon. 2. Air scattered throughout nondilated small bowel, query ileus Pelvic XR Mild bilateral femoroacetabular osteoarthritis  MRI T-spine No evidence for metastatic disease within the thoracic spine. 2. Evolving subacute compression fracture involving the superior endplate of T11 with no more than mild residual marrow edema. Height loss is not significantly changed or progressed as compared to prior MRI from 06/06/2023. 3. Chronic compression deformities involving the T12 and L1 vertebral bodies with sequelae of prior vertebral augmentation, stable. 4. Small disc protrusions at T6-7 and T7-8 without significant stenosis. Moderate right with mild left foraminal stenosis at T10-11.  5. Post treatment changes within the visualized right lung with known right  adrenal metastasis, described on recent CTs. MRI L-spine Acute compression fracture involving the inferior endplate of L2 with up to 60% height loss without significant bony retropulsion. This is superimposed on an underlying chronic L2 compression fracture, and is benign/mechanical in appearance. No evidence for metastatic disease within the lumbar spine. 2. Additional chronic compression deformities involving the T12, L1, L3, L4, and L5 vertebral bodies, otherwise stable. 3. Multifactorial degenerative changes at L4-5 with resultant moderate canal and left greater than right lateral recess stenosis, with mild to moderate bilateral L4 foraminal narrowing. 4. Left foraminal to extraforaminal disc protrusion at L3-4, potentially affecting the exiting left L3 nerve root. 5. 3.7 cm intra-abdominal aortic aneurysm. Recommend follow-up every 2 years.   Antibiotic Therapy: Anti-infectives (From admission, onward)    Start     Dose/Rate Route Frequency Ordered Stop   07/07/23 0600  ceFAZolin  (ANCEF ) IVPB 2g/100 mL premix       Note to Pharmacy: Patient is at [facility] and will transfer round-trip to [facility] for procedure.   2 g 200 mL/hr over 30 Minutes Intravenous To Radiology 07/05/23 1734 07/08/23 0600   07/03/23 0800  vancomycin  (VANCOCIN ) IVPB 1000 mg/200 mL premix  Status:  Discontinued        1,000 mg 200 mL/hr over 60 Minutes Intravenous Every 12 hours 07/02/23 2146 07/03/23 0722   07/03/23 0200  ceFEPIme  (MAXIPIME ) 2 g in sodium chloride  0.9 % 100 mL IVPB        2 g 200 mL/hr over 30 Minutes Intravenous Every 8 hours 07/02/23 2141     07/02/23 1845  vancomycin  (VANCOREADY) IVPB 2000 mg/400 mL        2,000 mg 200 mL/hr over 120 Minutes Intravenous  Once 07/02/23 1813 07/02/23 2120   07/02/23 1815  vancomycin  (VANCOCIN ) IVPB 1000 mg/200 mL premix  Status:  Discontinued        1,000 mg 200 mL/hr over 60 Minutes Intravenous  Once 07/02/23 1807 07/02/23 1813   07/02/23 1815  ceFEPIme  (MAXIPIME ) 2 g in  sodium chloride  0.9 % 100 mL IVPB        2 g 200 mL/hr over 30 Minutes Intravenous  Once 07/02/23 1807 07/02/23 1911       Procedures:   Consultants: Palliative care Heme/onc IR    Assessment and Plan: * Acute on chronic respiratory failure with hypoxia (HCC) 07-02-2023 through 07-05-2023 Uses 3 L nasal cannula O2 at baseline.  He desaturated to 83% and required up to 15 L high flow nasal cannula.  Currently on 4 L nasal cannula O2 - MRSA PCR negative - Strep pneumo antigen negative - Legionella antigen pending - COVID, RSV, influenza, respiratory viral panel negative  - Empiric cefepime   07-06-2023 no evidence of pneumonia. Pt was on empiric cefepime  due to neutropenia. No fevers. Discussed with oncology. Will stop IV Cefepime .  Pain of metastatic malignancy 07-06-2023 continue with IV dilaudid  prn. Has fentanyl  patch.  Intractable back pain 07-02-2023 through 07-06-2023 due to vertebral compression fractures.  Appreciate palliative care medicine for symptom management.  Started fentanyl  patch, Dilaudid , Lidoderm  patch, Robaxin , gabapentin  - CT thoracic spine revealed chronic compression deformities T10-L2.  He did have kyphoplasty of T12 back in July 2023, and kyphoplasty L1 and L3 in August 2023 - MRI thoracic and lumbar spine revealed L2 compression fracture - IR consulted for kyphoplasty  07-06-2023 Was on schedule for have kyphoplasty tomorrow. Just notified by IR that  pt's insurance company wants more information regarding his back pain. Continue with IV dilaudid  prn.  PAF (paroxysmal atrial fibrillation) (HCC) 07-06-2023 continue with IV heparin  gtts pending his kyphoplasty. On Toprol  XL  SIADH (syndrome of inappropriate ADH production) (HCC) 07-06-2023 Na stable. Na 128. - Urine osmole 575  - Urine sodium 129 - Salt tabs and fluid restriction - Improved  Hyponatremia 07-06-2023 Na stable. Na 128. - Urine osmole 575  - Urine sodium 129 - Salt tabs and fluid  restriction - Improved  Stage 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 (HCC) 07-06-2023 - Currently under active therapy with  Gemzar  and Navelbine  - Followed by Dr. Maria Shiner   DVT prophylaxis:   IV Heparin    Code Status: Full Code Family Communication: no family at bedside. Pt is decisional Disposition Plan: home vs SNF. Pt is amenable to either. Reason for continuing need for hospitalization: awaiting kyphoplasty, remains on IV heparin   Objective: Vitals:   07/06/23 0416 07/06/23 0850 07/06/23 1315 07/06/23 1317  BP: 110/75  (!) 119/106 126/73  Pulse: 90  100 95  Resp: 18  20   Temp: 98 F (36.7 C)  98.3 F (36.8 C)   TempSrc: Oral  Oral   SpO2: 94% 92% 92% 94%  Weight:      Height:        Intake/Output Summary (Last 24 hours) at 07/06/2023 1459 Last data filed at 07/06/2023 1230 Gross per 24 hour  Intake 1768.03 ml  Output 1375 ml  Net 393.03 ml   Filed Weights   07/03/23 0155  Weight: 106.1 kg    Examination:  Physical Exam Vitals and nursing note reviewed.  Constitutional:      General: He is not in acute distress.    Appearance: He is obese. He is not toxic-appearing or diaphoretic.     Comments: Appears chronically ill  HENT:     Head: Normocephalic and atraumatic.  Eyes:     General: No scleral icterus. Cardiovascular:     Rate and Rhythm: Normal rate.  Pulmonary:     Effort: Pulmonary effort is normal. No respiratory distress.  Abdominal:     General: Bowel sounds are normal. There is no distension.     Tenderness: There is no abdominal tenderness.  Skin:    General: Skin is warm and dry.     Capillary Refill: Capillary refill takes less than 2 seconds.  Neurological:     Mental Status: He is alert and oriented to person, place, and time.     Data Reviewed: I have personally reviewed following labs and imaging studies  CBC: Recent Labs  Lab 07/01/23 0814 07/02/23 1423 07/03/23 0500 07/04/23 0331 07/05/23 0428 07/06/23 0257  WBC  4.3 1.3* 1.5* 1.0* 1.0* 1.3*  NEUTROABS 3.9 0.6*  --   --   --  0.2*  HGB 12.6* 11.3* 11.4* 11.4* 11.2* 10.1*  HCT 36.8* 33.3* 32.8* 31.8* 33.4* 30.8*  MCV 88.0 89.3 87.9 88.1 92.5 91.9  PLT 259 175 211 188 205 211   Basic Metabolic Panel: Recent Labs  Lab 07/02/23 1423 07/02/23 1907 07/02/23 2330 07/03/23 0500 07/04/23 0331 07/05/23 0428 07/06/23 0257  NA 124*  --  122* 123* 124* 131* 128*  K 3.3*  --   --  3.8 3.5 3.7 3.4*  CL 91*  --   --  91* 95* 103 103  CO2 24  --   --  22 19* 20* 19*  GLUCOSE 103*  --   --  121* 105* 107* 125*  BUN 22  --   --  17 14 17 17   CREATININE 0.87  --   --  0.83 0.78 0.90 0.76  CALCIUM  8.4*  --   --  7.8* 8.1* 8.1* 7.9*  MG  --  1.8  --   --   --   --   --   PHOS  --  2.4*  --  4.0  --   --   --    GFR: Estimated Creatinine Clearance: 120.1 mL/min (by C-G formula based on SCr of 0.76 mg/dL). Liver Function Tests: Recent Labs  Lab 07/01/23 0814 07/02/23 1423 07/03/23 0500  AST 42* 28 29  ALT 55* 50* 48*  ALKPHOS 84 71 67  BILITOT 1.1 1.6* 1.3*  PROT 6.5 5.8* 5.5*  ALBUMIN  4.3 3.3* 3.3*   Coagulation Profile: Recent Labs  Lab 07/04/23 1318  INR 1.4*   Sepsis Labs: Recent Labs  Lab 07/02/23 1907  PROCALCITON 0.11    Recent Results (from the past 240 hours)  Culture, blood (Routine X 2) w Reflex to ID Panel     Status: None (Preliminary result)   Collection Time: 07/02/23 10:15 PM   Specimen: BLOOD RIGHT ARM  Result Value Ref Range Status   Specimen Description   Final    BLOOD RIGHT ARM Performed at [facility], [address], [place], [place] [address]    Special Requests   Final    BOTTLES DRAWN AEROBIC AND ANAEROBIC Blood Culture results may not be optimal due to an inadequate volume of blood received in culture bottles Performed at [facility], [address] [place], [place] [address]    Culture   Final    NO GROWTH 4 DAYS Performed at [facility], [address]  [place], [place] [address]    Report Status PENDING  Incomplete  Culture, blood (Routine X 2) w Reflex to ID Panel     Status: None (Preliminary result)   Collection Time: 07/02/23 10:15 PM   Specimen: BLOOD RIGHT ARM  Result Value Ref Range Status   Specimen Description   Final    BLOOD RIGHT ARM Performed at [facility], [address], [place], [place] [address]    Special Requests   Final    BOTTLES DRAWN AEROBIC AND ANAEROBIC Blood Culture results may not be optimal due to an inadequate volume of blood received in culture bottles Performed at [name], [address], [place], [place] [address]    Culture   Final    NO GROWTH 4 DAYS Performed at [facility], [address], [place], [place] [address]    Report Status PENDING  Incomplete  MRSA Next Gen by PCR, Nasal     Status: None   Collection Time: 07/02/23 11:57 PM   Specimen: Urine, Clean Catch; Nasal Swab  Result Value Ref Range Status   MRSA by PCR Next Gen NOT DETECTED NOT DETECTED Final    Comment: (NOTE) The GeneXpert MRSA Assay (FDA approved for NASAL specimens only), is one component of a comprehensive MRSA colonization surveillance program. It is not intended to diagnose MRSA infection nor to guide or monitor treatment for MRSA infections. Test performance is not FDA approved in patients less than 46 years old. Performed at [facility], [address] [place], [place] [address]   Resp panel by RT-PCR (RSV, Flu A&B, Covid) Anterior Nasal Swab  Status: None   Collection Time: 07/03/23  4:56 PM   Specimen: Anterior Nasal Swab  Result Value Ref Range Status   SARS Coronavirus 2 by RT PCR NEGATIVE NEGATIVE Final    Comment: (NOTE) SARS-CoV-2 target nucleic acids are NOT DETECTED.  The SARS-CoV-2 RNA is generally detectable in upper respiratory specimens during the acute phase of infection. The lowest concentration of SARS-CoV-2 viral copies this assay can  detect is 138 copies/mL. A negative result does not preclude SARS-Cov-2 infection and should not be used as the sole basis for treatment or other patient management decisions. A negative result may occur with  improper specimen collection/handling, submission of specimen other than nasopharyngeal swab, presence of viral mutation(s) within the areas targeted by this assay, and inadequate number of viral copies(<138 copies/mL). A negative result must be combined with clinical observations, patient history, and epidemiological information. The expected result is Negative.  Fact Sheet for Patients:  BloggerCourse.com  Fact Sheet for Healthcare Providers:  SeriousBroker.it  This test is no t yet approved or cleared by the [place]  FDA and  has been authorized for detection and/or diagnosis of SARS-CoV-2 by FDA under an Emergency Use Authorization (EUA). This EUA will remain  in effect (meaning this test can be used) for the duration of the COVID-19 declaration under Section 564(b)(1) of the Act, 21 U.S.C.section 360bbb-3(b)(1), unless the authorization is terminated  or revoked sooner.       Influenza A by PCR NEGATIVE NEGATIVE Final   Influenza B by PCR NEGATIVE NEGATIVE Final    Comment: (NOTE) The Xpert Xpress SARS-CoV-2/FLU/RSV plus assay is intended as an aid in the diagnosis of influenza from Nasopharyngeal swab specimens and should not be used as a sole basis for treatment. Nasal washings and aspirates are unacceptable for Xpert Xpress SARS-CoV-2/FLU/RSV testing.  Fact Sheet for Patients: BloggerCourse.com  Fact Sheet for Healthcare Providers: SeriousBroker.it  This test is not yet approved or cleared by the [place]  FDA and has been authorized for detection and/or diagnosis of SARS-CoV-2 by FDA under an Emergency Use Authorization (EUA). This EUA will remain in  effect (meaning this test can be used) for the duration of the COVID-19 declaration under Section 564(b)(1) of the Act, 21 U.S.C. section 360bbb-3(b)(1), unless the authorization is terminated or revoked.     Resp Syncytial Virus by PCR NEGATIVE NEGATIVE Final    Comment: (NOTE) Fact Sheet for Patients: BloggerCourse.com  Fact Sheet for Healthcare Providers: SeriousBroker.it  This test is not yet approved or cleared by the [place]  FDA and has been authorized for detection and/or diagnosis of SARS-CoV-2 by FDA under an Emergency Use Authorization (EUA). This EUA will remain in effect (meaning this test can be used) for the duration of the COVID-19 declaration under Section 564(b)(1) of the Act, 21 U.S.C. section 360bbb-3(b)(1), unless the authorization is terminated or revoked.  Performed at [facility], [address], [place], [place] [address]   Respiratory (~20 pathogens) panel by PCR     Status: None   Collection Time: 07/03/23  4:56 PM   Specimen: Nasopharyngeal Swab; Respiratory  Result Value Ref Range Status   Adenovirus NOT DETECTED NOT DETECTED Final   Coronavirus 229E NOT DETECTED NOT DETECTED Final    Comment: (NOTE) The Coronavirus on the Respiratory Panel, DOES NOT test for the novel  Coronavirus (2019 nCoV)    Coronavirus HKU1 NOT DETECTED NOT DETECTED Final   Coronavirus NL63 NOT DETECTED NOT DETECTED Final   Coronavirus OC43 NOT  DETECTED NOT DETECTED Final   Metapneumovirus NOT DETECTED NOT DETECTED Final   Rhinovirus / Enterovirus NOT DETECTED NOT DETECTED Final   Influenza A NOT DETECTED NOT DETECTED Final   Influenza B NOT DETECTED NOT DETECTED Final   Parainfluenza Virus 1 NOT DETECTED NOT DETECTED Final   Parainfluenza Virus 2 NOT DETECTED NOT DETECTED Final   Parainfluenza Virus 3 NOT DETECTED NOT DETECTED Final   Parainfluenza Virus 4 NOT DETECTED NOT DETECTED Final    Respiratory Syncytial Virus NOT DETECTED NOT DETECTED Final   Bordetella pertussis NOT DETECTED NOT DETECTED Final   Bordetella Parapertussis NOT DETECTED NOT DETECTED Final   Chlamydophila pneumoniae NOT DETECTED NOT DETECTED Final   Mycoplasma pneumoniae NOT DETECTED NOT DETECTED Final    Comment: Performed at [facility], [address] [place], [place] [address]    Scheduled Meds:  amiodarone   200 mg Oral Daily   budesonide -glycopyrrolate -formoterol   2 puff Inhalation BID   feeding supplement  237 mL Oral TID BM   fentaNYL   1 patch Transdermal Q72H   And   fentaNYL   1 patch Transdermal Q72H   gabapentin   100 mg Oral QHS   HYDROmorphone   2 mg Oral Q4H   lidocaine   1 patch Transdermal Q24H   metoprolol  succinate  25 mg Oral Daily   PARoxetine   10 mg Oral QHS   polyethylene glycol  17 g Oral Daily   predniSONE   5 mg Oral Q breakfast   senna-docusate  2 tablet Oral BID   sodium chloride   1 g Oral BID WC   Vitamin D  (Ergocalciferol )  50,000 Units Oral Q7 days   Continuous Infusions:  sodium chloride  10 mL/hr at 07/05/23 1843   [START ON 07/07/2023]  ceFAZolin  (ANCEF ) IV     heparin  1,400 Units/hr (07/06/23 0515)     LOS: 4 days   Time spent: 50 minutes  Unk Garb, DO  [facility]  07/06/2023, 2:59 PM

## 2023-07-06 NOTE — Progress Notes (Signed)
   07/06/23 1235  Spiritual Encounters  Type of Visit Follow up  Care provided to: Patient  Referral source Chaplain assessment  Reason for visit Routine spiritual support   I visited briefly with John [name] in follow up to conversation on 5/19.  Specifically, I provided John [name] with information to facilitate him and his wife in connecting with support groups offered by spiritual care through the [facility]. He appreciated the information. We kept visit brief as he was planning to have lunch. I let him know how he could request a chaplain visit through the care team. Page at number below. Will continue to follow.  Taaliyah Delpriore L. Minetta Aly, M.Div [phone]

## 2023-07-06 NOTE — Subjective & Objective (Addendum)
 Pt seen and examined. Pt is trying hard with PT. Does not want to burden his wife with his care. He wants to try rehab. Discussed that he needs to be able to participate with PT at least 3 hours per day. Pt is only able to stand at bedside right now. Doubt he will qualify for CIR.

## 2023-07-06 NOTE — Hospital Course (Addendum)
 63 y.o. 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 He was walking without assistive device before this hospitalization.A few days before the hospitalization, he noticed left leg weakness and pain which has progressed since.    He was admitted for intractable back pain, found to have an acute compression fracture of L2.  Now s/p kyphoplasty.   After the procedure, he had left lower extremity pain and weakness.  This has improved with steroids.    Now awaiting CIR, insurance authorization started.   Patient has been accepted at [facility] today  Procedures: 07-08-2023 lumbar L2 kyphoplasty  Consultants: Palliative care Heme/onc IR   Subjective: Seen and examined+ Resting well, lt leg still weak cannot lift it or walk on it x 4 wks. Overnight afebrile BP stable-soft Last labs reviewed from 5/31: Repeat labs ordered for today, CBC stable BMP pending  Discharge diagnosis   Intractable Back Pain Left LE Pain and weakness Compression Fracture of L2  Chronic compression Deformities (T12, L1, L3, L4, and L5 Vertebral Bodies): MRI T/L spine showed acute compression fracture and other findings- see report S/p L2 kyphoplasty on 5/23, and also treated with pain medication, steroids> with improving symptoms question if this is radicular pain. Neurosurgery was discussed> no recommendation for surgical intervention, Continue pain control, on scheduled fentanyl  patch, steroid taper, and as needed Tylenol /oral and IV narcotics, muscle relaxants, lidocaine  patch. Continue PT OT PT/OT> CIR monitoring hoping to have CIR  bed soon After steroid taper is done to resume home dose of prednisone .   Acute on Chronic Hypoxic Respiratory Failure: Uses 3 L at baseline> wean to home setting. Cont IS, ptot.  Of note initially needed up to 15 L HFNC. CT PE>without PE, multifocal areas of GGO throughout both lungs along with peripheral reticular opacities (infectious/inflammatory). Stable focal airspace opacity in R perihilar region and sperior segment of the RLL with central cavitation.   S/p Cefepime  5/17-5/21 and negative covid, flu, RSV.  Negative urine strep, negative urine legionella, negative MRSA PCR.   Stage IV Squamous Cell Carcinoma of R Lung Following with Dr. Maria Shiner on gemzar  and navelbine    Atrial Fibrillation: Rate controlled continue metoprolol , oral amiodarone , anticoagulation with Eliquis .     SIADH Stable on salt tabs and fluid restriction   Body mass index is 32.64 kg/m. Class I obesity weight loss advised

## 2023-07-06 NOTE — Assessment & Plan Note (Addendum)
 07-06-2023 continue with IV dilaudid  prn. Has fentanyl  patch.  07-07-2023 continue with IV dilaudid  prn. Continue with fentanyl  patch.  07-08-2023 continue with IV dilaudid  prn. On fentanyl  patch scheduled and prn oral dilaudid .  07-09-2023 continue with IV dilaudid  prn. On fentanyl  patch scheduled and prn oral dilaudid .  07-10-2023 continue with IV dilaudid  prn. On fentanyl  patch scheduled and prn oral dilaudid .  05-26-205 still using IV dilaudid  prn. On scheduled fentanyl  patch and prn oral dilaudid . Will continue IV dilaudid  prn for now.  07-12-2023 still using occ IV dilaudid  prn. Will continue with prn IV dilaudid . Continue with scheduled fentanyl  patch and po dilaudid .

## 2023-07-06 NOTE — Progress Notes (Signed)
 John [name] feels a lot better.  His pain is under better control right now.  He is requiring quite a bit of pain medication for right now.  He is on heparin  so that we can do the kyphoplasty.  Hopefully this will be done on Thursday or Friday.  He has a better appetite.  There is no nausea or vomiting.  She is going to the bathroom.  His sodium 128.  Potassium 3.4.  BUN 17 creatinine 0.76.  Calcium  is 7.9.  His white cell count is slowly coming up.  White cell count is 1.3.  Hemoglobin 10.1.  Platelet count 211,000.  Was important is that the monocytes are coming up quite nicely.  He now has 53% monocytes.  This should bode well for his white cells to recover.  He has had no bleeding.  He has had no cough or shortness of breath.    He does have a very low vitamin D  level.  It is only 19. We will go ahead and put him on some supplemental vitamin D .  He has had no fever.  He has had no bleeding.  There has been no rashes.   Vital signs show temperature 98.  Pulse 90.  Blood pressure 110/75.  His head and neck exam shows no ocular or oral lesions.  He has no palpable cervical or supraclavicular lymph nodes.  Lungs are clear bilaterally.  He has good air movement bilaterally.  Cardiac exam irregular rate and irregular rhythm consistent with the atrial fibrillation.  Abdomen soft.  Bowel sounds are active.  He has no fluid wave.  There is no abdominal mass.  There is no palpable liver or spleen tip.  Extremity shows weakness in the left hip.  He has no swelling in the legs.  Neurological exam shows the weakness in the left hip.   I am glad that IR will be able to do the kyphoplasty.  Hopefully this will help his pain.  It has helped before for other compression fractures that he has had.  I am not sure why the weakness in the left hip.  This might be a sequela of the pathologic fracture.  Again, hopefully this will improve.  Thankfully, his cancer seems to be holding pretty steady right now.  I  suspect his white cells will continue to recover.  I do appreciate the wonderful care he is getting from everybody on [address]   Rayleen Cal, MD  Hebrews 12:12

## 2023-07-06 NOTE — Assessment & Plan Note (Addendum)
 07-02-2023 through 07-06-2023 due to vertebral compression fractures.  Appreciate palliative care medicine for symptom management.  Started fentanyl  patch, Dilaudid , Lidoderm  patch, Robaxin , gabapentin  - CT thoracic spine revealed chronic compression deformities T10-L2.  He did have kyphoplasty of T12 back in July 2023, and kyphoplasty L1 and L3 in August 2023 - MRI thoracic and lumbar spine revealed L2 compression fracture - IR consulted for kyphoplasty  07-06-2023 Was on schedule for have kyphoplasty tomorrow. Just notified by IR that pt's insurance company wants more information regarding his back pain. Continue with IV dilaudid  prn.  07-07-2023 still awaiting on insurance authorization. Continue with prn IV dilaudid  for pain control.  PT assessment cannot being until after his kyphoplasty.  07-08-2023 Pt and wife are expectedly upset that insurance authorization has not been obtained. I explained to both of them that providers have no control on what insurance companies authorize or reject. Pt states he called insurance company and he states insurance company claims they don't have any requests for kyphoplasty authorization. I have reached out to IR APP to make sure that request for authorization has been sent by radiology group to pt's insurance company.  Continue IV Dilaudid  prn.   07-09-2023 had L2 kyphoplasty yesterday. Continue prn IV dilaudid . Start PT.  07-10-2023 continue IV dilaudid . On po dilaudid  prn and scheduled fentanyl  patch. Continue with PT efforts. May need SNF vs home health vs CIR. Will see how he does this holiday weekend(memorial day).  07-11-2023 continue with po dilaudid  prn and scheduled fentanyl  patch. Continue IV dilaudid  prn. Still using IV dilaudid  but not as much. Pt will need SNF placement for PT. I don't think he is a candidate for CIR. Pt only able to tolerate 13 mins of PT today.  07-12-2023 still using occ IV dilaudid  prn. Will continue with prn IV dilaudid .  Continue with scheduled fentanyl  patch and po dilaudid . Continue with PT. Needs SNF for PT. CM starting bed search process. He is medically stable for DC to SNF.

## 2023-07-06 NOTE — Assessment & Plan Note (Addendum)
 07-02-2023 through 07-05-2023 Uses 3 L nasal cannula O2 at baseline.  He desaturated to 83% and required up to 15 L high flow nasal cannula.  Currently on 4 L nasal cannula O2 - MRSA PCR negative - Strep pneumo antigen negative - Legionella antigen pending - COVID, RSV, influenza, respiratory viral panel negative  - Empiric cefepime   07-06-2023 no evidence of pneumonia. Pt was on empiric cefepime  due to neutropenia. No fevers. Discussed with oncology. Will stop IV Cefepime .  07-07-2023 stable on 3 L/min. Abx stopped on 07-06-2023.  07-08-2023 stable. On 3 L/min.  07-09-2023 stable on 3 L/min.  07-10-2023 stable.  07-11-2023 stable on supplemental O2. Currently on 5 L/min. Stable.  07-12-2023 stable on supplemental O2 3-5 L/min.

## 2023-07-07 ENCOUNTER — Encounter: Payer: Self-pay | Admitting: Hematology & Oncology

## 2023-07-07 DIAGNOSIS — Z515 Encounter for palliative care: Secondary | ICD-10-CM | POA: Diagnosis not present

## 2023-07-07 DIAGNOSIS — R4589 Other symptoms and signs involving emotional state: Secondary | ICD-10-CM

## 2023-07-07 DIAGNOSIS — S22080S Wedge compression fracture of T11-T12 vertebra, sequela: Secondary | ICD-10-CM | POA: Diagnosis not present

## 2023-07-07 DIAGNOSIS — M549 Dorsalgia, unspecified: Secondary | ICD-10-CM | POA: Diagnosis not present

## 2023-07-07 DIAGNOSIS — E871 Hypo-osmolality and hyponatremia: Secondary | ICD-10-CM | POA: Diagnosis not present

## 2023-07-07 DIAGNOSIS — C3490 Malignant neoplasm of unspecified part of unspecified bronchus or lung: Secondary | ICD-10-CM

## 2023-07-07 DIAGNOSIS — G893 Neoplasm related pain (acute) (chronic): Secondary | ICD-10-CM

## 2023-07-07 DIAGNOSIS — C7971 Secondary malignant neoplasm of right adrenal gland: Secondary | ICD-10-CM

## 2023-07-07 DIAGNOSIS — Z7189 Other specified counseling: Secondary | ICD-10-CM

## 2023-07-07 DIAGNOSIS — Z79899 Other long term (current) drug therapy: Secondary | ICD-10-CM

## 2023-07-07 DIAGNOSIS — J9621 Acute and chronic respiratory failure with hypoxia: Secondary | ICD-10-CM | POA: Diagnosis not present

## 2023-07-07 LAB — BASIC METABOLIC PANEL WITH GFR
Anion gap: 7 (ref 5–15)
BUN: 14 mg/dL (ref 8–23)
CO2: 22 mmol/L (ref 22–32)
Calcium: 8.2 mg/dL — ABNORMAL LOW (ref 8.9–10.3)
Chloride: 101 mmol/L (ref 98–111)
Creatinine, Ser: 0.78 mg/dL (ref 0.61–1.24)
GFR, Estimated: >60 mL/min (ref >60)
Glucose, Bld: 107 mg/dL — ABNORMAL HIGH (ref 70–99)
Potassium: 4.2 mmol/L (ref 3.5–5.1)
Sodium: 130 mmol/L — ABNORMAL LOW (ref 135–145)

## 2023-07-07 LAB — CBC WITH DIFFERENTIAL/PLATELET
Abs Immature Granulocytes: 0.04 10*3/uL (ref 0.00–0.07)
Basophils Absolute: 0 10*3/uL (ref 0.0–0.1)
Basophils Relative: 2 %
Eosinophils Absolute: 0 10*3/uL (ref 0.0–0.5)
Eosinophils Relative: 2 %
HCT: 32 % — ABNORMAL LOW (ref 39.0–52.0)
Hemoglobin: 10.5 g/dL — ABNORMAL LOW (ref 13.0–17.0)
Immature Granulocytes: 2 %
Lymphocytes Relative: 19 %
Lymphs Abs: 0.4 10*3/uL — ABNORMAL LOW (ref 0.7–4.0)
MCH: 30.4 pg (ref 26.0–34.0)
MCHC: 32.8 g/dL (ref 30.0–36.0)
MCV: 92.8 fL (ref 80.0–100.0)
Monocytes Absolute: 0.9 10*3/uL (ref 0.1–1.0)
Monocytes Relative: 45 %
Neutro Abs: 0.6 10*3/uL — ABNORMAL LOW (ref 1.7–7.7)
Neutrophils Relative %: 30 %
Platelets: 234 10*3/uL (ref 150–400)
RBC: 3.45 MIL/uL — ABNORMAL LOW (ref 4.22–5.81)
RDW: 19.3 % — ABNORMAL HIGH (ref 11.5–15.5)
WBC: 2.1 10*3/uL — ABNORMAL LOW (ref 4.0–10.5)
nRBC: 1.9 % — ABNORMAL HIGH (ref 0.0–0.2)

## 2023-07-07 LAB — PROTIME-INR
INR: 1.1 (ref 0.8–1.2)
Prothrombin Time: 14.5 s (ref 11.4–15.2)

## 2023-07-07 LAB — CULTURE, BLOOD (ROUTINE X 2)
Culture: NO GROWTH
Culture: NO GROWTH

## 2023-07-07 LAB — APTT: aPTT: 72 s — ABNORMAL HIGH (ref 24–36)

## 2023-07-07 LAB — HEPARIN LEVEL (UNFRACTIONATED): Heparin Unfractionated: 0.61 [IU]/mL (ref 0.30–0.70)

## 2023-07-07 NOTE — Assessment & Plan Note (Signed)
Body mass index is 32.64 kg/m².

## 2023-07-07 NOTE — Progress Notes (Signed)
 PROGRESS NOTE    John [name]  ZOX:[id number] DOB: [birth year] DOA: 07/02/2023 PCP: Patient, No Pcp Per  Subjective: Pt seen and examined. Still awaiting to hear back from pt's insurance company regarding authorization for kyphoplasty. Met with pt and wife at bedside today.   Hospital Course: HPI: Khizar Zellers is a 63 y.o. 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  Patient reports she has had back pain for months but the pain was too severe today to tolerate.  His home Percocet is not controlling his pain.  He describes the pain as sharp in his low back, radiating to both legs. He also reports difficulty breathing today.  On EMS arrival, patient was found to have SpO2 of 88% which improved with 6 L [place]. He reports difficulty walking due to the pain, poor appetite and feeling thirsty but denies any fevers, chills, cough, nausea, vomiting, abdominal pain, headache or dizziness.   ED Course: Initial vitals show temp 98.1, RR 19, HR 90, BP 161/101 and SpO2 95% on 6 L [place].  Labs significant for sodium 124, K+ 3.3, creatinine 0.87, WBC 1.3 (from 4.3 1-day ago), Hgb 11.3, platelet 175, mag 1.8, Phos 2.4. CXR shows right basilar consolidation concerning for acute airway disease versus atelectasis. CTA PE study negative for PE but shows signs of possible multifocal pneumonia. Patient received IV morphine  1 mg x 2, IV ketamine  32 mg x 1, Toradol  15 mg x 1, IV NS 500 cc x 1, IV cefepime  and IV vancomycin . TRH was consulted for admission.  Significant Events: Admitted 07/02/2023 acute on chronic respiratory failure with  hypoxia   Significant Labs: Covid/flu/RSV negative RVP negative Na 124, K 3.5, CO2 of 19, BUN 14, Scr 0.78, glu 105 INR 1.4 WBC 1.0, HgB 11.2, plt 205 Legionella urinary antigen Negative Strep Pneumo antigen Negative  Significant Imaging Studies: CXR Increasing right basilar consolidation superimposed upon chronic post radiation fibrosis, which may reflect acute airspace disease or atelectasis. CTPA No evidence for pulmonary embolism. 2. Stable multifocal areas of ground-glass opacity throughout both lungs along with some peripheral reticular opacities. Findings are favored as infectious/inflammatory. 3. Stable focal airspace opacity in the right perihilar region and superior segment of the right lower lobe with some central cavitation. Findings may be  infectious/inflammatory. 4. Stable 3.3 cm infrarenal abdominal aortic aneurysm. Recommend follow-up ultrasound every 3 years. Aortic Atherosclerosis (ICD10-I70.0) and Emphysema (ICD10-J43.9)  CT T-spine Chronic compression deformities from T10 through L2, with evidence of prior vertebral augmentation at T12 and L1. No acute thoracic spine fracture. Abd XR No significant formed stool in the colon. 2. Air scattered throughout nondilated small bowel, query ileus Pelvic XR Mild bilateral femoroacetabular osteoarthritis  MRI T-spine No evidence for metastatic disease within the thoracic spine. 2. Evolving subacute compression fracture involving the superior endplate of T11 with no more than mild residual marrow edema. Height loss is not significantly changed or progressed as compared to prior MRI from 06/06/2023. 3. Chronic compression deformities involving the T12 and L1 vertebral bodies with sequelae of prior vertebral augmentation, stable. 4. Small disc protrusions at T6-7 and T7-8 without significant stenosis. Moderate right with mild left foraminal stenosis at T10-11. 5.  Post treatment changes within the visualized right lung with known right  adrenal metastasis, described on recent CTs. MRI L-spine Acute compression fracture involving the inferior endplate of L2 with up to 60% height loss without significant bony retropulsion. This is superimposed on an underlying chronic L2 compression fracture, and is benign/mechanical in appearance. No evidence for metastatic disease within the lumbar spine. 2. Additional chronic compression deformities involving the T12, L1, L3, L4, and L5 vertebral bodies, otherwise stable. 3. Multifactorial degenerative changes at L4-5 with resultant moderate canal and left greater than right lateral recess stenosis, with mild to moderate bilateral L4 foraminal narrowing. 4. Left foraminal to extraforaminal disc protrusion at L3-4, potentially affecting the exiting left L3 nerve root. 5. 3.7 cm intra-abdominal aortic aneurysm. Recommend follow-up every 2 years.   Antibiotic Therapy: Anti-infectives (From admission, onward)    Start     Dose/Rate Route Frequency Ordered Stop   07/07/23 0600  ceFAZolin  (ANCEF ) IVPB 2g/100 mL premix       Note to Pharmacy: Patient is at [facility] and will transfer round-trip to [facility] for procedure.   2 g 200 mL/hr over 30 Minutes Intravenous To Radiology 07/05/23 1734 07/08/23 0600   07/03/23 0800  vancomycin  (VANCOCIN ) IVPB 1000 mg/200 mL premix  Status:  Discontinued        1,000 mg 200 mL/hr over 60 Minutes Intravenous Every 12 hours 07/02/23 2146 07/03/23 0722   07/03/23 0200  ceFEPIme  (MAXIPIME ) 2 g in sodium chloride  0.9 % 100 mL IVPB        2 g 200 mL/hr over 30 Minutes Intravenous Every 8 hours 07/02/23 2141     07/02/23 1845  vancomycin  (VANCOREADY) IVPB 2000 mg/400 mL        2,000 mg 200 mL/hr over 120 Minutes Intravenous  Once 07/02/23 1813 07/02/23 2120   07/02/23 1815  vancomycin  (VANCOCIN ) IVPB 1000 mg/200 mL premix  Status:  Discontinued        1,000 mg 200 mL/hr over 60 Minutes Intravenous  Once 07/02/23 1807 07/02/23 1813   07/02/23 1815  ceFEPIme  (MAXIPIME ) 2 g in  sodium chloride  0.9 % 100 mL IVPB        2 g 200 mL/hr over 30 Minutes Intravenous  Once 07/02/23 1807 07/02/23 1911       Procedures:   Consultants: Palliative care Heme/onc IR    Assessment and Plan: * Acute on chronic respiratory failure with hypoxia (HCC) 07-02-2023 through 07-05-2023 Uses 3 L nasal cannula O2 at baseline.  He desaturated to 83% and required up to 15 L high flow nasal cannula.  Currently on 4 L nasal cannula O2 - MRSA PCR negative - Strep pneumo antigen negative - Legionella antigen pending - COVID, RSV, influenza, respiratory viral panel negative  - Empiric cefepime   07-06-2023 no evidence of pneumonia. Pt was on empiric cefepime  due to neutropenia. No fevers. Discussed with oncology. Will stop IV Cefepime .  07-07-2023 stable on 3 L/min. Abx stopped on 07-06-2023.  Pain of metastatic malignancy 07-06-2023 continue with IV dilaudid  prn. Has fentanyl  patch.  07-07-2023 continue with IV dilaudid  prn. Continue with fentanyl  patch.  Intractable back pain 07-02-2023 through 07-06-2023 due to vertebral compression fractures.  Appreciate palliative care medicine for symptom management.  Started fentanyl  patch, Dilaudid , Lidoderm  patch, Robaxin , gabapentin  - CT thoracic spine revealed chronic compression deformities T10-L2.  He did have kyphoplasty of T12 back in July 2023, and kyphoplasty L1 and L3 in August 2023 - MRI thoracic and lumbar spine revealed L2 compression  fracture - IR consulted for kyphoplasty  07-06-2023 Was on schedule for have kyphoplasty tomorrow. Just notified by IR that pt's insurance company wants more information regarding his back pain. Continue with IV dilaudid  prn.  07-07-2023 still awaiting on insurance authorization. Continue with prn IV dilaudid  for pain control.  PT assessment cannot being until after his kyphoplasty.  Obesity, Class I, BMI 30-34.9 Body mass index is 32.64 kg/m.   PAF (paroxysmal atrial fibrillation)  (HCC) 07-06-2023 continue with IV heparin  gtts pending his kyphoplasty. On Toprol  XL  07-07-2023 stable. Remains on IV heparin  gtts.  SIADH (syndrome of inappropriate ADH production) (HCC) 07-06-2023 Na stable. Na 128. - Urine osmole 575  - Urine sodium 129 - Salt tabs and fluid restriction - Improved  07-07-2023 Na 130 today.    Hyponatremia 07-06-2023 Na stable. Na 128. - Urine osmole 575  - Urine sodium 129 - Salt tabs and fluid restriction - Improved  07-07-2023 Na 130 today.  Stage 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 (HCC) 07-06-2023 - Currently under active therapy with  Gemzar  and Navelbine  - Followed by Dr. Maria Shiner  07-07-2023 stable.  DVT prophylaxis:   IV Heparin    Code Status: Full Code Family Communication: discussed with pt's wife at bedside Disposition Plan: unknown Reason for continuing need for hospitalization: awaiting ins to approve kyphoplasty. Remains on IV heparin  gtts.  Objective: Vitals:   07/06/23 1317 07/06/23 2008 07/07/23 0430 07/07/23 0914  BP: 126/73 116/70 124/81   Pulse: 95 90 94   Resp:  15 20   Temp:  98.3 F (36.8 C) 98.3 F (36.8 C)   TempSrc:  Oral Oral   SpO2: 94% 96% 93% 93%  Weight:      Height:        Intake/Output Summary (Last 24 hours) at 07/07/2023 1016 Last data filed at 07/07/2023 0835 Gross per 24 hour  Intake 1488.01 ml  Output 1900 ml  Net -411.99 ml   Filed Weights   07/03/23 0155  Weight: 106.1 kg    Examination:  Physical Exam Vitals and nursing note reviewed.  Constitutional:      General: He is not in acute distress.    Appearance: He is obese. He is not toxic-appearing or diaphoretic.  HENT:     Head: Normocephalic and atraumatic.     Nose: Nose normal.  Cardiovascular:     Rate and Rhythm: Normal rate and regular rhythm.  Pulmonary:     Effort: Pulmonary effort is normal.     Breath sounds: Normal breath sounds.  Abdominal:     General: Bowel sounds are normal.     Palpations:  Abdomen is soft.  Skin:    Capillary Refill: Capillary refill takes less than 2 seconds.  Neurological:     Mental Status: He is alert and oriented to person, place, and time.     Data Reviewed: I have personally reviewed following labs and imaging studies  CBC: Recent Labs  Lab 07/01/23 0814 07/01/23 0814 07/02/23 1423 07/03/23 0500 07/04/23 0331 07/05/23 0428 07/06/23 0257 07/07/23 0359  WBC 4.3   < > 1.3* 1.5* 1.0* 1.0* 1.3* 2.1*  NEUTROABS 3.9  --  0.6*  --   --   --  0.2* 0.6*  HGB 12.6*   < > 11.3* 11.4* 11.4* 11.2* 10.1* 10.5*  HCT 36.8*  --  33.3* 32.8* 31.8* 33.4* 30.8* 32.0*  MCV 88.0  --  89.3 87.9 88.1 92.5 91.9 92.8  PLT 259   < >  175 211 188 205 211 234   < > = values in this interval not displayed.   Basic Metabolic Panel: Recent Labs  Lab 07/02/23 1907 07/02/23 2330 07/03/23 0500 07/04/23 0331 07/05/23 0428 07/06/23 0257 07/07/23 0359  NA  --    < > 123* 124* 131* 128* 130*  K  --   --  3.8 3.5 3.7 3.4* 4.2  CL  --   --  91* 95* 103 103 101  CO2  --   --  22 19* 20* 19* 22  GLUCOSE  --   --  121* 105* 107* 125* 107*  BUN  --   --  17 14 17 17 14   CREATININE  --   --  0.83 0.78 0.90 0.76 0.78  CALCIUM   --   --  7.8* 8.1* 8.1* 7.9* 8.2*  MG 1.8  --   --   --   --   --   --   PHOS 2.4*  --  4.0  --   --   --   --    < > = values in this interval not displayed.   GFR: Estimated Creatinine Clearance: 120.1 mL/min (by C-G formula based on SCr of 0.78 mg/dL). Liver Function Tests: Recent Labs  Lab 07/01/23 0814 07/02/23 1423 07/03/23 0500  AST 42* 28 29  ALT 55* 50* 48*  ALKPHOS 84 71 67  BILITOT 1.1 1.6* 1.3*  PROT 6.5 5.8* 5.5*  ALBUMIN  4.3 3.3* 3.3*   Coagulation Profile: Recent Labs  Lab 07/04/23 1318 07/07/23 0359  INR 1.4* 1.1   Sepsis Labs: Recent Labs  Lab 07/02/23 1907  PROCALCITON 0.11    Recent Results (from the past 240 hours)  Culture, blood (Routine X 2) w Reflex to ID Panel     Status: None   Collection Time:  07/02/23 10:15 PM   Specimen: BLOOD RIGHT ARM  Result Value Ref Range Status   Specimen Description   Final    BLOOD RIGHT ARM Performed at [facility], [address], [place], [place] [address]    Special Requests   Final    BOTTLES DRAWN AEROBIC AND ANAEROBIC Blood Culture results may not be optimal due to an inadequate volume of blood received in culture bottles Performed at [facility], [address] [place], [place] [address]    Culture   Final    NO GROWTH 5 DAYS Performed at [facility], [address], [place], [place] [address]    Report Status 07/07/2023 FINAL  Final  Culture, blood (Routine X 2) w Reflex to ID Panel     Status: None   Collection Time: 07/02/23 10:15 PM   Specimen: BLOOD RIGHT ARM  Result Value Ref Range Status   Specimen Description   Final    BLOOD RIGHT ARM Performed at [facility], [address] [place], [place] [address]    Special Requests   Final    BOTTLES DRAWN AEROBIC AND ANAEROBIC Blood Culture results may not be optimal due to an inadequate volume of blood received in culture bottles Performed at [facility], [address], [name], [place] [address]    Culture   Final    NO GROWTH 5 DAYS Performed at [facility], [address], [place], [place] [address]    Report Status 07/07/2023 FINAL  Final  MRSA Next Gen by PCR, Nasal     Status: None  Collection Time: 07/02/23 11:57 PM   Specimen: Urine, Clean Catch; Nasal Swab  Result Value Ref Range Status   MRSA by PCR Next Gen NOT DETECTED NOT DETECTED Final    Comment: (NOTE) The GeneXpert MRSA Assay (FDA approved for NASAL specimens only), is one component of a comprehensive MRSA colonization surveillance program. It is not intended to diagnose MRSA infection nor to guide or monitor treatment for MRSA infections. Test performance is not FDA approved in patients less than 33 years old. Performed at [facility], [address], [place], [place] [address]   Resp panel by RT-PCR (RSV, Flu A&B, Covid) Anterior Nasal Swab     Status: None   Collection Time: 07/03/23  4:56 PM   Specimen: Anterior Nasal Swab  Result Value Ref Range Status   SARS Coronavirus 2 by RT PCR NEGATIVE NEGATIVE Final    Comment: (NOTE) SARS-CoV-2 target nucleic acids are NOT DETECTED.  The SARS-CoV-2 RNA is generally detectable in upper respiratory specimens during the acute phase of infection. The lowest concentration of SARS-CoV-2 viral copies this assay can detect is 138 copies/mL. A negative result does not preclude SARS-Cov-2 infection and should not be used as the sole basis for treatment or other patient management decisions. A negative result may occur with  improper specimen collection/handling, submission of specimen other than nasopharyngeal swab, presence of viral mutation(s) within the areas targeted by this assay, and inadequate number of viral copies(<138 copies/mL). A negative result must be combined with clinical observations, patient history, and epidemiological information. The expected result is Negative.  Fact Sheet for Patients:  BloggerCourse.com  Fact Sheet for Healthcare Providers:  SeriousBroker.it  This test is no t yet approved or cleared by the [place]  FDA and  has been authorized for detection and/or diagnosis of SARS-CoV-2 by FDA under an Emergency Use Authorization (EUA). This EUA will remain  in effect (meaning this test can be used) for the duration of the COVID-19 declaration under Section 564(b)(1) of the Act, 21 U.S.C.section 360bbb-3(b)(1), unless the authorization is terminated  or revoked sooner.       Influenza A by PCR NEGATIVE NEGATIVE Final   Influenza B by PCR NEGATIVE NEGATIVE Final    Comment: (NOTE) The Xpert Xpress SARS-CoV-2/FLU/RSV plus assay is intended as an aid in the diagnosis of  influenza from Nasopharyngeal swab specimens and should not be used as a sole basis for treatment. Nasal washings and aspirates are unacceptable for Xpert Xpress SARS-CoV-2/FLU/RSV testing.  Fact Sheet for Patients: BloggerCourse.com  Fact Sheet for Healthcare Providers: SeriousBroker.it  This test is not yet approved or cleared by the [place]  FDA and has been authorized for detection and/or diagnosis of SARS-CoV-2 by FDA under an Emergency Use Authorization (EUA). This EUA will remain in effect (meaning this test can be used) for the duration of the COVID-19 declaration under Section 564(b)(1) of the Act, 21 U.S.C. section 360bbb-3(b)(1), unless the authorization is terminated or revoked.     Resp Syncytial Virus by PCR NEGATIVE NEGATIVE Final    Comment: (NOTE) Fact Sheet for Patients: BloggerCourse.com  Fact Sheet for Healthcare Providers: SeriousBroker.it  This test is not yet approved or cleared by the [place]  FDA and has been authorized for detection and/or diagnosis of SARS-CoV-2 by FDA under an Emergency Use Authorization (EUA). This EUA will remain in effect (meaning this test can be used) for the duration of the COVID-19 declaration under Section 564(b)(1) of the Act, 21 U.S.C. section 360bbb-3(b)(1), unless  the authorization is terminated or revoked.  Performed at [facility], [address] [place], [place] [address]   Respiratory (~20 pathogens) panel by PCR     Status: None   Collection Time: 07/03/23  4:56 PM   Specimen: Nasopharyngeal Swab; Respiratory  Result Value Ref Range Status   Adenovirus NOT DETECTED NOT DETECTED Final   Coronavirus 229E NOT DETECTED NOT DETECTED Final    Comment: (NOTE) The Coronavirus on the Respiratory Panel, DOES NOT test for the novel  Coronavirus (2019 nCoV)    Coronavirus HKU1 NOT DETECTED  NOT DETECTED Final   Coronavirus NL63 NOT DETECTED NOT DETECTED Final   Coronavirus OC43 NOT DETECTED NOT DETECTED Final   Metapneumovirus NOT DETECTED NOT DETECTED Final   Rhinovirus / Enterovirus NOT DETECTED NOT DETECTED Final   Influenza A NOT DETECTED NOT DETECTED Final   Influenza B NOT DETECTED NOT DETECTED Final   Parainfluenza Virus 1 NOT DETECTED NOT DETECTED Final   Parainfluenza Virus 2 NOT DETECTED NOT DETECTED Final   Parainfluenza Virus 3 NOT DETECTED NOT DETECTED Final   Parainfluenza Virus 4 NOT DETECTED NOT DETECTED Final   Respiratory Syncytial Virus NOT DETECTED NOT DETECTED Final   Bordetella pertussis NOT DETECTED NOT DETECTED Final   Bordetella Parapertussis NOT DETECTED NOT DETECTED Final   Chlamydophila pneumoniae NOT DETECTED NOT DETECTED Final   Mycoplasma pneumoniae NOT DETECTED NOT DETECTED Final    Comment: Performed at [facility], [address] [place], [place] [address]   Scheduled Meds:  amiodarone   200 mg Oral Daily   budesonide -glycopyrrolate -formoterol   2 puff Inhalation BID   feeding supplement  237 mL Oral TID BM   fentaNYL   1 patch Transdermal Q72H   And   fentaNYL   1 patch Transdermal Q72H   gabapentin   100 mg Oral QHS   HYDROmorphone   2 mg Oral Q4H   lidocaine   1 patch Transdermal Q24H   metoprolol  succinate  25 mg Oral Daily   PARoxetine   10 mg Oral QHS   polyethylene glycol  17 g Oral Daily   predniSONE   5 mg Oral Q breakfast   senna-docusate  2 tablet Oral BID   sodium chloride   1 g Oral BID WC   Vitamin D  (Ergocalciferol )  50,000 Units Oral Q7 days   Continuous Infusions:  heparin  1,400 Units/hr (07/07/23 0154)     LOS: 5 days   Time spent: 50 minutes  Unk Garb, DO  [facility]  07/07/2023, 10:16 AM

## 2023-07-07 NOTE — Progress Notes (Signed)
 PHARMACY - ANTICOAGULATION CONSULT NOTE  Pharmacy Consult for Eliquis  >> heparin  Indication: atrial fibrillation  Allergies  Allergen Reactions   Ventolin  [Albuterol ] Other (See Comments)    Patient has had episode of atrial fib following administration of albuterol  (tolerates Xopenex  well)   Lipitor [Atorvastatin ] Other (See Comments)    Arthralgias    Oxycodone  Other (See Comments)    Severe Migranes    Patient Measurements: Height: 5\' 11"  (180.3 cm) Weight: 106.1 kg (234 lb) IBW/kg (Calculated) : 75.3 HEPARIN  DW (KG): 97.7  Vital Signs: Temp: 98.3 F (36.8 C) (05/22 0430) Temp Source: Oral (05/22 0430) BP: 124/81 (05/22 0430) Pulse Rate: 94 (05/22 0430)  Labs: Recent Labs    07/04/23 1318 07/05/23 0428 07/05/23 0428 07/05/23 1815 07/06/23 0257 07/07/23 0359  HGB  --  11.2*   < >  --  10.1* 10.5*  HCT  --  33.4*  --   --  30.8* 32.0*  PLT  --  205  --   --  211 234  APTT  --   --   --  76* 94* 72*  LABPROT 17.0*  --   --   --   --  14.5  INR 1.4*  --   --   --   --  1.1  HEPARINUNFRC  --   --   --  >1.10*  --  0.61  CREATININE  --  0.90  --   --  0.76 0.78   < > = values in this interval not displayed.    Estimated Creatinine Clearance: 120.1 mL/min (by C-G formula based on SCr of 0.78 mg/dL).   Medical History: Past Medical History:  Diagnosis Date   Arthritis    Rheumatoid   Bacteremia due to Pseudomonas    C. difficile colitis    Calculus of GB and bile duct w ac and chr cholecyst w obst 06/08/2023   Chronic anxiety 11/08/2014   Cough 09/22/2021   COVID 2020   mild case   Depression    Drug-induced neutropenia (HCC) 08/11/2020   Family history of adverse reaction to anesthesia    brother with seizures had episode under anesthesia.  Patient has seizures as well.   GERD (gastroesophageal reflux disease)    History of radiation therapy 04/24/2020-05/16/2020   IMRT to right lung     Dr Retta Caster   History of radiation therapy     08/10/21-08/19/21-Dr. Retta Caster   History of radiation therapy    Abdomen- 03/21/23-03/31/23-Dr. Retta Caster   Neutropenia [facility]) 05/12/2020   Neutropenic fever (HCC) 05/12/2020   Normocytic anemia 04/18/2021   PAF (paroxysmal atrial fibrillation) (HCC)    CHADS2VSAC score 3   Rheumatoid aortitis    Secondary hypercoagulable state (HCC) 10/28/2020   Sepsis (HCC) 10/06/2020   Shortness of breath 12/18/2020   Squamous cell lung cancer [facility])     Assessment: 63 YO male presenting with worsening back pain and shortness of breath. MRI spine showed osteoporotic compression fractures. Oncology planning for possible vertebroplasty. Patient currently on PTA Eliquis  for atrial fibrillation--last dose was 5mg  on 5/19 PM. Pharmacy consulted for heparin  dosing, pending vertebroplasty procedure.   Today, 07/07/23: aPTT 72, heparin  level 0.61 - both therapeutic on heparin  1400 units/hr Will transition to heparin  level monitoring now that aPTT and heparin  level are correlating  Hgb 10.5, Pltc wnl No bleeding complications, or infusion interruptions reported Vertebroplasty tentatively scheduled today (5/22), however, insurance is delaying the procedure. Per patient's wife, they would be willing  to pay for the procedure out-of-pocket but will be calling insurance today for further discussion. Pharmacy will continue to follow.    Goal of Therapy:  Heparin  level 0.3-0.7 units/ml aPTT 66-102 seconds  Monitor platelets by anticoagulation protocol: Yes   Plan:  Continue heparin  IV infusion at 1400 units/hr Check heparin  level and CBC daily F/u plans for holding heparin  around kyphoplasty (hold 2h before procedure, per IR) - tentatively scheduled for IR on Thursday 07/07/23, insurance pending.   Roselee Cong, PharmD Clinical Pharmacist  5/22/20259:28 AM

## 2023-07-07 NOTE — Progress Notes (Signed)
 Daily Progress Note   Patient Name: John [name]       Date: 07/07/2023 DOB: Oct 10, 1960  Age: 63 y.o. MRN#: [id number] Attending Physician: Unk Garb, DO Primary Care Physician: Patient, No Pcp Per Admit Date: 07/02/2023 Length of Stay: 5 days  Reason for Consultation/Follow-up: Establishing goals of care and Pain control  Subjective:   CC: Patient frustrated by awaiting for insurance approval for kyphoplasty.  Following up regarding complex medical decision making and symptom management.  Subjective:  Reviewed EMR prior to presenting to bedside.  At time of EMR review in past 24 hours patient has required as needed p.o. Dilaudid  2 mg x 3 doses and as needed IV Dilaudid  1 mg x 3 doses.  Patient continues scheduled p.o. Dilaudid  2 mg every 4 hours, fentanyl  37 mcg patch every 72 hours, and gabapentin  100 mg nightly. Reviewed recent hospitalist and oncology note. Discussed care with RN to coordinate for medical updates.  Presents to bedside to see patient.  Patient's wife not present at bedside at time of visit.  Patient laying in bed.  Able to introduce a supplement provide palliative medicine team.  Reviewed patient's pain at this time.  Patient noted he had worsening pain this morning though received medication for it.  Reviewed patient's pain medications including as needed and scheduled dosing.  At this time would not increase patient's fentanyl  patch dose since was just increased on 07/06/2023.  Discussed if patient continues to receive multiple as needed medications, can consider increase of fentanyl  patch even possibly tomorrow.  Patient did not want to adjust as needed medication dosing as he feels the medication doses are currently appropriate for him.  Acknowledged this.  Spent time providing emotional support via active listening.  Patient very frustrated that his insurance is delaying approval of kyphoplasty.  Patient's pain limits his ability to even work with physical therapy to  make steps towards discharge.  Empathized with difficult situation.    Answered questions as able at that time.  Noted palliative medicine team continue to follow along with patient's medical journey.  Objective:   Vital Signs:  BP 124/81 (BP Location: Right Arm)   Pulse 94   Temp 98.3 F (36.8 C) (Oral)   Resp 20   Ht 5\' 11"  (1.803 m)   Wt 106.1 kg   SpO2 93%   BMI 32.64 kg/m   Physical Exam: General: NAD, alert, laying in bed Cardiovascular: RRR Respiratory: no increased work of breathing noted, not in respiratory distress Neuro: A&Ox4, following commands easily Psych: Anxious   Assessment & Plan:   Assessment: John [name] is a 63 y.o. 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  Palliative medicine team consulted to assist with complex medical decision making and symptom management.  Recommendations/Plan: # Complex medical decision making/goals of care:  - Patient continues discussions with providers regarding care planning.  Patient planning to follow-up with oncology in the outpatient setting.  Palliative medicine team continuing to follow along and assist with conversations as able and appropriate.  -  Code Status: Full Code  # Symptom management: Patient is receiving these palliative interventions for symptom management with an intent to improve quality  of life.   - Pain, in the setting of metastatic squamous cell carcinoma of the lung   - Continue fentanyl  patch 37 mcg every 72 hour   - Continue oral Dilaudid  2 mg every 4 hours scheduled    - Continue oral Dilaudid  2 mg every 4 hours as  needed   - Continue IV Dilaudid  1 mg every 4 hours as needed severe breakthrough pain   - Continue Lidoderm  patch   - Continue gabapentin  100 mg nightly   - Appreciate IR assistance with possible kyphoplasty intervention   - Constipation   - Continue senna 2 tabs twice daily   - Continue MiraLAX  17 g daily   - Continue bisacodyl  suppository daily as needed  # Psychosocial Support:  - Wife  # Discharge Planning: To Be Determined  - Will plan to place outpatient palliative medicine follow-up at [facility] closer to time of discharge  Discussed with: Patient, RN  Thank you for allowing the palliative care team to participate in the care John [name].  Barnett Libel, DO Palliative Care Provider PMT # [phone]  If patient remains symptomatic despite maximum doses, please call PMT at [phone] between 0700 and 1900. Outside of these hours, please call attending, as PMT does not have night coverage.  Personally spent 35 minutes in patient care including extensive chart review (labs, imaging, progress/consult notes, vital signs), medically appropraite exam, discussed with treatment team, education to patient, family, and staff, documenting clinical information, medication review and management, coordination of care, and available advanced directive documents.

## 2023-07-07 NOTE — Progress Notes (Signed)
 In in patient's room and he and wife are aware of the fact that patient is waiting for insurance auth to have his kyphoplasty. He and his wife are super agitated and are afraid the procedure is not going to take place until Tuesday since this is a holiday weekend. They are asking to talk with the charge nurse and anyone who can assist them. I notified the charge nurse and secure chatted Dr. Farrel Hones.

## 2023-07-07 NOTE — Progress Notes (Signed)
 Hopefully, he will have a kyphoplasty today.  His blood counts are certainly better.  His white count is 2.1.  Hemoglobin 10.5.  Platelet count 234,000.  His ANC is 0.6.  I think this is certainly appropriate and adequate for him to have a kyphoplasty.  He has pretty good pain control right now.  He really is not out of bed.  He has had no fever.  He has had no bleeding.  He has had no cough.  He may be having little bit of wheezing from just being in bed.  His appetite is certainly a little bit better now that his pain is under better control.  His sodium is 130.  Potassium 4.2.  BUN 14 creatinine 0.78.  His vital signs show temperature of 98.3.  Pulse 94.  Blood pressure 124/81.  His lungs sound clear bilaterally.  There may be little bit of expiratory wheezes.  Cardiac exam regular rate and rhythm.  He has an occasional extra beat.  Abdomen is soft.  Bowel sounds are present.  He has no guarding or rebound tenderness.  Extremities shows little bit of improved strength over the left hip.  Neurological exam is nonfocal.  Again, we are awaiting the kyphoplasty for L2.  Hopefully, this will be done today.  Otherwise, everything seems to be holding pretty stable from my point of view.  There does not appear to be any evidence of malignancy is a problem in the back.  We will continue to follow along.  I know he is getting great care from everybody on [address]   Rayleen Cal, MD  [facility] 1:37

## 2023-07-08 ENCOUNTER — Other Ambulatory Visit: Payer: Self-pay

## 2023-07-08 ENCOUNTER — Inpatient Hospital Stay

## 2023-07-08 ENCOUNTER — Ambulatory Visit (HOSPITAL_COMMUNITY)
Admit: 2023-07-08 | Discharge: 2023-07-08 | Disposition: A | Discharge status: Home / Self Care | Attending: Hematology & Oncology | Admitting: Hematology & Oncology

## 2023-07-08 ENCOUNTER — Inpatient Hospital Stay: Admitting: Hematology & Oncology

## 2023-07-08 DIAGNOSIS — Z515 Encounter for palliative care: Secondary | ICD-10-CM | POA: Diagnosis not present

## 2023-07-08 DIAGNOSIS — Z79899 Other long term (current) drug therapy: Secondary | ICD-10-CM

## 2023-07-08 DIAGNOSIS — E222 Syndrome of inappropriate secretion of antidiuretic hormone: Secondary | ICD-10-CM | POA: Diagnosis not present

## 2023-07-08 DIAGNOSIS — J9621 Acute and chronic respiratory failure with hypoxia: Secondary | ICD-10-CM | POA: Diagnosis not present

## 2023-07-08 DIAGNOSIS — M549 Dorsalgia, unspecified: Secondary | ICD-10-CM | POA: Diagnosis not present

## 2023-07-08 DIAGNOSIS — S22080S Wedge compression fracture of T11-T12 vertebra, sequela: Secondary | ICD-10-CM | POA: Diagnosis not present

## 2023-07-08 DIAGNOSIS — K5903 Drug induced constipation: Secondary | ICD-10-CM

## 2023-07-08 DIAGNOSIS — C7971 Secondary malignant neoplasm of right adrenal gland: Secondary | ICD-10-CM | POA: Diagnosis not present

## 2023-07-08 DIAGNOSIS — G893 Neoplasm related pain (acute) (chronic): Secondary | ICD-10-CM | POA: Diagnosis not present

## 2023-07-08 HISTORY — PX: IR KYPHO LUMBAR INC FX REDUCE BONE BX UNI/BIL CANNULATION INC/IMAGING: IMG5519

## 2023-07-08 LAB — CBC
HCT: 32 % — ABNORMAL LOW (ref 39.0–52.0)
Hemoglobin: 10.5 g/dL — ABNORMAL LOW (ref 13.0–17.0)
MCH: 30.6 pg (ref 26.0–34.0)
MCHC: 32.8 g/dL (ref 30.0–36.0)
MCV: 93.3 fL (ref 80.0–100.0)
Platelets: 267 10*3/uL (ref 150–400)
RBC: 3.43 MIL/uL — ABNORMAL LOW (ref 4.22–5.81)
RDW: 19.1 % — ABNORMAL HIGH (ref 11.5–15.5)
WBC: 3 10*3/uL — ABNORMAL LOW (ref 4.0–10.5)
nRBC: 1.7 % — ABNORMAL HIGH (ref 0.0–0.2)

## 2023-07-08 LAB — HEPARIN LEVEL (UNFRACTIONATED): Heparin Unfractionated: 0.54 [IU]/mL (ref 0.30–0.70)

## 2023-07-08 MED ORDER — HEPARIN (PORCINE) 25000 UT/250ML-% IV SOLN: 1400.0000 [IU]/h | INTRAVENOUS | Status: DC

## 2023-07-08 MED ORDER — LIDOCAINE HCL (PF) 1 % IJ SOLN
INTRAMUSCULAR | Status: AC
  Filled 2023-07-08: qty 30

## 2023-07-08 MED ORDER — HYDROMORPHONE HCL 1 MG/ML IJ SOLN
INTRAMUSCULAR | Status: AC | PRN
  Administered 2023-07-08: 1 mg via INTRAVENOUS

## 2023-07-08 MED ORDER — BUPIVACAINE HCL (PF) 0.5 % IJ SOLN
INTRAMUSCULAR | Status: AC
  Filled 2023-07-08: qty 30

## 2023-07-08 MED ORDER — FENTANYL CITRATE (PF) 100 MCG/2ML IJ SOLN
INTRAMUSCULAR | Status: AC
  Filled 2023-07-08: qty 2

## 2023-07-08 MED ORDER — MIDAZOLAM HCL 2 MG/2ML IJ SOLN
INTRAMUSCULAR | Status: AC
Start: 2023-07-08 — End: ?
  Filled 2023-07-08: qty 2

## 2023-07-08 MED ORDER — HYDROMORPHONE HCL 1 MG/ML IJ SOLN
INTRAMUSCULAR | Status: AC
  Filled 2023-07-08: qty 1

## 2023-07-08 MED ORDER — APIXABAN 5 MG PO TABS
5.0000 mg | ORAL_TABLET | Freq: Two times a day (BID) | ORAL | Status: DC
  Administered 2023-07-08 – 2023-07-20 (×24): 5 mg via ORAL
  Filled 2023-07-08 (×24): qty 1

## 2023-07-08 MED ORDER — FENTANYL 50 MCG/HR TD PT72
1.0000 | MEDICATED_PATCH | TRANSDERMAL | Status: DC
  Administered 2023-07-08: 1 via TRANSDERMAL
  Filled 2023-07-08: qty 1

## 2023-07-08 MED ORDER — IOHEXOL 300 MG/ML  SOLN
50.0000 mL | Freq: Once | INTRAMUSCULAR | Status: AC | PRN
  Administered 2023-07-08: 20 mL

## 2023-07-08 MED ORDER — LIDOCAINE HCL (PF) 1 % IJ SOLN
30.0000 mL | Freq: Once | INTRAMUSCULAR | Status: AC
  Administered 2023-07-08: 20 mL via INTRADERMAL

## 2023-07-08 MED ORDER — CEFAZOLIN (ANCEF) 1 G IV SOLR
INTRAVENOUS | Status: AC | PRN
  Administered 2023-07-08: 1 g

## 2023-07-08 MED ORDER — FENTANYL CITRATE (PF) 100 MCG/2ML IJ SOLN
INTRAMUSCULAR | Status: AC | PRN
  Administered 2023-07-08: 50 ug via INTRAVENOUS
  Administered 2023-07-08 (×2): 25 ug via INTRAVENOUS
  Administered 2023-07-08: 50 ug via INTRAVENOUS

## 2023-07-08 MED ORDER — MIDAZOLAM HCL 2 MG/2ML IJ SOLN
INTRAMUSCULAR | Status: AC | PRN
  Administered 2023-07-08 (×2): .5 mg via INTRAVENOUS
  Administered 2023-07-08 (×2): 1 mg via INTRAVENOUS

## 2023-07-08 MED ORDER — CEFAZOLIN SODIUM-DEXTROSE 2-4 GM/100ML-% IV SOLN
INTRAVENOUS | Status: AC
  Filled 2023-07-08: qty 100

## 2023-07-08 MED ORDER — BUPIVACAINE HCL (PF) 0.5 % IJ SOLN: 30.0000 mL | Freq: Once | INTRAMUSCULAR | Status: DC

## 2023-07-08 MED ORDER — MIDAZOLAM HCL 2 MG/2ML IJ SOLN
INTRAMUSCULAR | Status: AC
  Filled 2023-07-08: qty 2

## 2023-07-08 NOTE — Progress Notes (Signed)
 Patient c/o pain at IV site (midline L upper arm).  Phlebitis grade 2.  Nothing running through IV.  Pharmacy notified and recommended monitoring site.  IV removed with catheter intact.

## 2023-07-08 NOTE — Progress Notes (Signed)
 PT Cancellation Note  Patient Details Name: John [name] MRN: [id number] DOB: September 24, 1960   Cancelled Treatment:    Reason Eval/Treat Not Completed: Pain limiting ability to participate.j Will follow.    Lynann Sandman Kistler PT 07/08/2023  Acute Rehabilitation Services  Office [phone]

## 2023-07-08 NOTE — Progress Notes (Signed)
 Daily Progress Note   Patient Name: John [name]       Date: 07/08/2023 DOB: 05-05-1960  Age: 63 y.o. MRN#: [id number] Attending Physician: Unk Garb, DO Primary Care Physician: Patient, No Pcp Per Admit Date: 07/02/2023 Length of Stay: 6 days  Reason for Consultation/Follow-up: Establishing goals of care and Pain control  Subjective:   CC: Patient noted continued discussions around getting kyphoplasty approved by insurance.  Following up regarding complex medical decision making and symptom management.  Subjective:  At time of EMR review in past 24 hours patient has required as needed IV Dilaudid  1 mg x 2 doses and as needed p.o. Dilaudid  2 mg x 2 doses.  Patient continues to receive scheduled p.o. Dilaudid  2 mg every 4 hours.  Patient also on fentanyl  37 mcg every 72 hour patch. Vital signs does not show any hypotension, increased work of breathing, or tachycardia. Reviewed recent hospitalist, oncologist, and PT documentation.  Presented to bedside to see patient.  Patient laying in bed.  Patient's wife on phone.  Able to again introduced myself as a member of the palliative medicine team.  Patient voicing continued frustration about insurance not approving his kyphoplasty though this is limiting his ability to walk.  Empathized with difficult situation.  Patient noted that IR just left not too long ago and they are planning for peer to peer with patient's insurance about possible approval.  Discussed symptom management at this time.  Noted patient on patient's OME requirements, can increase patient's fentanyl  patch to 50 mcg every 72 hours.  Hope would be that once this can assist with long acting pain management, can stop oral Dilaudid  scheduled and use as as needed medication.  Patient and wife agree with increasing fentanyl  patch dose today.  Will continue other current medications.  Patient can refuse scheduled Dilaudid  if feels he does not need the medication.  Noted palliative  medicine team continue to follow with patient's medical journey.  At time of EMR review in afternoon, patient being transferred to [facility] for kyphoplasty.  Objective:   Vital Signs:  BP 121/76 (BP Location: Right Arm)   Pulse 95   Temp 98.2 F (36.8 C) (Oral)   Resp 20   Ht 5\' 11"  (1.803 m)   Wt 106.1 kg   SpO2 93%   BMI 32.64 kg/m   Physical Exam: General: NAD, alert, laying in bed Cardiovascular: RRR Respiratory: no increased work of breathing noted, not in respiratory distress Neuro: A&Ox4, following commands easily Psych: Frustrated   Assessment & Plan:   Assessment: John [name] is a 62 y.o. 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  Palliative medicine team consulted to assist with complex medical decision making and symptom management.  Recommendations/Plan: # Complex medical decision making/goals of care:  - Patient continues discussions with providers regarding care planning.  Patient transferred today to [facility] for kyphoplasty intervention.  Patient planning to follow-up with oncology in the outpatient setting.  Palliative medicine team continuing to follow along and assist with conversations as able and appropriate.  -  Code Status: Full Code  # Symptom management: Patient is receiving these palliative interventions for symptom  management with an intent to improve quality of life.   - Pain, severe in the setting of metastatic squamous cell carcinoma of the lung now with back pain requiring kyphoplasty Within the past 24 hours hours, patient has required 16 mg  of p.o. Dilaudid  and 2 mg of IV Dilaudid   for opioid management. Based on OMEs calculated for this dose, which would be approximately 52.5 OME's when reducing by 50% for incomplete cross tolerance, will appropriately start patient on the listed regimen below.   - Increase fentanyl  patch to 50 mcg every 72 hour   - Continue oral Dilaudid  2 mg every 4 hours scheduled    - Continue oral Dilaudid  2 mg every 4 hours as needed   - Continue IV Dilaudid  1 mg every 4 hours as needed severe breakthrough pain   - Continue Lidoderm  patch   - Continue gabapentin  100 mg nightly   - Appreciate IR assistance with kyphoplasty intervention   - Constipation   - Continue senna 2 tabs twice daily   - Continue MiraLAX  17 g daily   - Continue bisacodyl  suppository daily as needed  # Psychosocial Support:  - Wife  # Discharge Planning: To Be Determined  - Placed outpatient palliative medicine referral on 07/08/2023 for follow-up at [facility].  Discussed with: Patient, RN  Thank you for allowing the palliative care team to participate in the care [name].  Barnett Libel, DO Palliative Care Provider PMT # [phone]  If patient remains symptomatic despite maximum doses, please call PMT at [phone] between 0700 and 1900. Outside of these hours, please call attending, as PMT does not have night coverage.

## 2023-07-08 NOTE — Progress Notes (Signed)
 Unfortunately, insurance has gotten in the way once again.  They are deciding whether or not to approve this kyphoplasty which clearly is indicated.  He is not able to function and because of the compression fracture at L2.  This is not going to heal up on its own because of the chemotherapy that he is taking.  The only way to get him better and to improve his quality of life is to have a kyphoplasty.  I just really hate this for John [name].  He is doing everything that he can do.  Hopefully, insurance will come around and approve this so this can be done today.  He has had a decent appetite.  He is going to the bathroom.  Again, he really is not out of bed.  He has had no cough.  He has had no shortness of breath.  There has been no nausea or vomiting.  He has had no rashes.  There has been no headache.  He still has some weakness in the left hip.  His CBC continues to improve.  His white cell count is 3.  Hemoglobin 10.5.  Platelet count 267,000.  His vital signs show temperature 98.2.  Pulse 95.  Blood pressure 121/76.  Overall, the following no change in his physical exam.  I know that he and his wife said that they would pay for the procedure.  They have the financial means to do this.  We will continue to follow along.   Rayleen Cal, MD  [place] 12:12

## 2023-07-08 NOTE — Progress Notes (Signed)
 PROGRESS NOTE    John [name]  AOZ:[id number] DOB: 26-Apr-1960 DOA: 07/02/2023 PCP: Patient, No Pcp Per  Subjective: Pt seen and examined. Wife at bedside. Pt and wife are expectedly upset that insurance authorization has not been obtained. I explained to both of them that providers have no control on what insurance companies authorize or reject. Pt states he called insurance company and he states insurance company claims they don't have any requests for kyphoplasty authorization.  I have reached out to IR APP to make sure that request for authorization has been sent by radiology group to pt's insurance company.  Pt's pain is reasonable well controlled with IV/PO dilaudid  and fentanyl  patch. He remains on IV heparin .   Hospital Course: HPI: John [name] is a 63 y.o. 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  Patient reports she has had back pain for months but the pain was too severe today to tolerate.  His home Percocet is not controlling his pain.  He describes the pain as sharp in his low back, radiating to both legs. He also reports difficulty breathing today.  On EMS arrival, patient was found to have SpO2 of 88% which improved with 6 L [place]. He reports difficulty walking due to the pain, poor appetite and feeling thirsty but denies any fevers, chills, cough, nausea, vomiting, abdominal pain, headache or dizziness.   ED Course: Initial vitals show temp 98.1, RR 19, HR 90, BP 161/101 and SpO2 95% on 6 L [place].  Labs significant for sodium 124, K+ 3.3, creatinine 0.87, WBC 1.3 (from 4.3 1-day ago), Hgb 11.3, platelet  175, mag 1.8, Phos 2.4. CXR shows right basilar consolidation concerning for acute airway disease versus atelectasis. CTA PE study negative for PE but shows signs of possible multifocal pneumonia. Patient received IV morphine  1 mg x 2, IV ketamine  32 mg x 1, Toradol  15 mg x 1, IV NS 500 cc x 1, IV cefepime  and IV vancomycin . TRH was consulted for admission.  Significant Events: Admitted 07/02/2023 acute on chronic respiratory failure with hypoxia   Significant Labs: Covid/flu/RSV negative RVP negative Na 124, K 3.5, CO2 of 19, BUN 14, Scr 0.78, glu 105 INR 1.4 WBC 1.0, HgB 11.2, plt 205 Legionella urinary antigen Negative Strep Pneumo antigen Negative  Significant Imaging Studies: CXR Increasing right basilar consolidation superimposed upon chronic post radiation fibrosis, which may reflect acute airspace disease or atelectasis. CTPA No evidence for pulmonary embolism. 2. Stable multifocal areas of ground-glass opacity throughout both lungs along with some peripheral reticular opacities. Findings are favored as infectious/inflammatory. 3. Stable focal airspace opacity in the right perihilar region and superior segment of the right lower lobe with some central cavitation. Findings may be  infectious/inflammatory. 4. Stable 3.3 cm infrarenal abdominal aortic aneurysm. Recommend follow-up ultrasound every 3 years. Aortic Atherosclerosis (ICD10-I70.0) and Emphysema (ICD10-J43.9)  CT T-spine Chronic compression deformities from T10 through L2, with evidence of prior vertebral augmentation at T12 and L1. No acute thoracic spine fracture. Abd XR No significant formed stool in the colon. 2. Air scattered throughout nondilated small bowel, query ileus Pelvic XR Mild bilateral femoroacetabular osteoarthritis  MRI T-spine No evidence for metastatic disease within  the thoracic spine. 2. Evolving subacute compression fracture involving the superior endplate of T11 with no more than mild residual marrow edema.  Height loss is not significantly changed or progressed as compared to prior MRI from 06/06/2023. 3. Chronic compression deformities involving the T12 and L1 vertebral bodies with sequelae of prior vertebral augmentation, stable. 4. Small disc protrusions at T6-7 and T7-8 without significant stenosis. Moderate right with mild left foraminal stenosis at T10-11. 5. Post treatment changes within the visualized right lung with known right adrenal metastasis, described on recent CTs. MRI L-spine Acute compression fracture involving the inferior endplate of L2 with up to 60% height loss without significant bony retropulsion. This is superimposed on an underlying chronic L2 compression fracture, and is benign/mechanical in appearance. No evidence for metastatic disease within the lumbar spine. 2. Additional chronic compression deformities involving the T12, L1, L3, L4, and L5 vertebral bodies, otherwise stable. 3. Multifactorial degenerative changes at L4-5 with resultant moderate canal and left greater than right lateral recess stenosis, with mild to moderate bilateral L4 foraminal narrowing. 4. Left foraminal to extraforaminal disc protrusion at L3-4, potentially affecting the exiting left L3 nerve root. 5. 3.7 cm intra-abdominal aortic aneurysm. Recommend follow-up every 2 years.   Antibiotic Therapy: Anti-infectives (From admission, onward)    Start     Dose/Rate Route Frequency Ordered Stop   07/07/23 0600  ceFAZolin  (ANCEF ) IVPB 2g/100 mL premix       Note to Pharmacy: Patient is at [facility] and will transfer round-trip to [facility] for procedure.   2 g 200 mL/hr over 30 Minutes Intravenous To Radiology 07/05/23 1734 07/08/23 0600   07/03/23 0800  vancomycin  (VANCOCIN ) IVPB 1000 mg/200 mL premix  Status:  Discontinued        1,000 mg 200 mL/hr over 60 Minutes Intravenous Every 12 hours 07/02/23 2146 07/03/23 0722   07/03/23 0200  ceFEPIme  (MAXIPIME ) 2 g in sodium chloride  0.9 % 100 mL IVPB        2 g 200 mL/hr  over 30 Minutes Intravenous Every 8 hours 07/02/23 2141     07/02/23 1845  vancomycin  (VANCOREADY) IVPB 2000 mg/400 mL        2,000 mg 200 mL/hr over 120 Minutes Intravenous  Once 07/02/23 1813 07/02/23 2120   07/02/23 1815  vancomycin  (VANCOCIN ) IVPB 1000 mg/200 mL premix  Status:  Discontinued        1,000 mg 200 mL/hr over 60 Minutes Intravenous  Once 07/02/23 1807 07/02/23 1813   07/02/23 1815  ceFEPIme  (MAXIPIME ) 2 g in sodium chloride  0.9 % 100 mL IVPB        2 g 200 mL/hr over 30 Minutes Intravenous  Once 07/02/23 1807 07/02/23 1911       Procedures:   Consultants: Palliative care Heme/onc IR    Assessment and Plan: * Acute on chronic respiratory failure with hypoxia (HCC) 07-02-2023 through 07-05-2023 Uses 3 L nasal cannula O2 at baseline.  He desaturated to 83% and required up to 15 L high flow nasal cannula.  Currently on 4 L nasal cannula O2 - MRSA PCR negative - Strep pneumo antigen negative - Legionella antigen pending - COVID, RSV, influenza, respiratory viral panel negative  - Empiric cefepime   07-06-2023 no evidence of pneumonia. Pt was on empiric cefepime  due to neutropenia. No fevers. Discussed with oncology. Will stop IV Cefepime .  07-07-2023 stable on 3 L/min. Abx stopped on 07-06-2023.  07-08-2023 stable. On 3 L/min.  Pain of metastatic malignancy 07-06-2023 continue with IV  dilaudid  prn. Has fentanyl  patch.  07-07-2023 continue with IV dilaudid  prn. Continue with fentanyl  patch.  07-08-2023 continue with IV dilaudid  prn. On fentanyl  patch scheduled and prn oral dilaudid .  Intractable back pain 07-02-2023 through 07-06-2023 due to vertebral compression fractures.  Appreciate palliative care medicine for symptom management.  Started fentanyl  patch, Dilaudid , Lidoderm  patch, Robaxin , gabapentin  - CT thoracic spine revealed chronic compression deformities T10-L2.  He did have kyphoplasty of T12 back in July 2023, and kyphoplasty L1 and L3 in August  2023 - MRI thoracic and lumbar spine revealed L2 compression fracture - IR consulted for kyphoplasty  07-06-2023 Was on schedule for have kyphoplasty tomorrow. Just notified by IR that pt's insurance company wants more information regarding his back pain. Continue with IV dilaudid  prn.  07-07-2023 still awaiting on insurance authorization. Continue with prn IV dilaudid  for pain control.  PT assessment cannot being until after his kyphoplasty.  07-08-2023 Pt and wife are expectedly upset that insurance authorization has not been obtained. I explained to both of them that providers have no control on what insurance companies authorize or reject. Pt states he called insurance company and he states insurance company claims they don't have any requests for kyphoplasty authorization. I have reached out to IR APP to make sure that request for authorization has been sent by radiology group to pt's insurance company.  Continue IV Dilaudid  prn.   Obesity, Class I, BMI 30-34.9 Body mass index is 32.64 kg/m.   PAF (paroxysmal atrial fibrillation) (HCC) 07-06-2023 continue with IV heparin  gtts pending his kyphoplasty. On Toprol  XL  07-07-2023 stable. Remains on IV heparin  gtts.  07-08-2023 continue IV heparin  gtts. Awaiting pt to complete kyphoplasty prior to restarting po Eliquis   SIADH (syndrome of inappropriate ADH production) (HCC) 07-06-2023 Na stable. Na 128. - Urine osmole 575  - Urine sodium 129 - Salt tabs and fluid restriction - Improved  07-07-2023 Na 130 today.    Hyponatremia 07-06-2023 Na stable. Na 128. - Urine osmole 575  - Urine sodium 129 - Salt tabs and fluid restriction - Improved  07-07-2023 Na 130 today.  Stage 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 (HCC) 07-06-2023 - Currently under active therapy with  Gemzar  and Navelbine  - Followed by Dr. Maria Shiner  07-07-2023 stable.  07-08-2023 oncology following.  DVT prophylaxis:   IV Heparin    Code Status: Full  Code Family Communication: discussed with pt and wife at bedside Disposition Plan: home vs snf vs CIR Reason for continuing need for hospitalization: remains on IV heparin  gtts awaiting kyphoplasty.  Objective: Vitals:   07/07/23 1253 07/07/23 2006 07/07/23 2036 07/08/23 0523  BP: 121/75 104/60  121/76  Pulse: 89 84  95  Resp: 20 20  20   Temp: 98.2 F (36.8 C) 98.2 F (36.8 C)    TempSrc:  Oral    SpO2: 90% 94% 93% 93%  Weight:      Height:        Intake/Output Summary (Last 24 hours) at 07/08/2023 0952 Last data filed at 07/08/2023 0840 Gross per 24 hour  Intake 751.96 ml  Output 600 ml  Net 151.96 ml   Filed Weights   07/03/23 0155  Weight: 106.1 kg    Examination:  Physical Exam Vitals and nursing note reviewed.  Constitutional:      General: He is not in acute distress.    Appearance: He is obese. He is not toxic-appearing.  HENT:     Head: Normocephalic and atraumatic.  Eyes:  General: No scleral icterus. Cardiovascular:     Rate and Rhythm: Normal rate and regular rhythm.  Pulmonary:     Effort: Pulmonary effort is normal.     Breath sounds: Normal breath sounds.  Abdominal:     General: Bowel sounds are normal. There is no distension.     Palpations: Abdomen is soft.  Skin:    General: Skin is warm and dry.     Capillary Refill: Capillary refill takes less than 2 seconds.  Neurological:     Mental Status: He is oriented to person, place, and time.     Data Reviewed: I have personally reviewed following labs and imaging studies  CBC: Recent Labs  Lab 07/02/23 1423 07/03/23 0500 07/04/23 0331 07/05/23 0428 07/06/23 0257 07/07/23 0359 07/08/23 0330  WBC 1.3*   < > 1.0* 1.0* 1.3* 2.1* 3.0*  NEUTROABS 0.6*  --   --   --  0.2* 0.6*  --   HGB 11.3*   < > 11.4* 11.2* 10.1* 10.5* 10.5*  HCT 33.3*   < > 31.8* 33.4* 30.8* 32.0* 32.0*  MCV 89.3   < > 88.1 92.5 91.9 92.8 93.3  PLT 175   < > 188 205 211 234 267   < > = values in this interval not  displayed.   Basic Metabolic Panel: Recent Labs  Lab 07/02/23 1907 07/02/23 2330 07/03/23 0500 07/04/23 0331 07/05/23 0428 07/06/23 0257 07/07/23 0359  NA  --    < > 123* 124* 131* 128* 130*  K  --   --  3.8 3.5 3.7 3.4* 4.2  CL  --   --  91* 95* 103 103 101  CO2  --   --  22 19* 20* 19* 22  GLUCOSE  --   --  121* 105* 107* 125* 107*  BUN  --   --  17 14 17 17 14   CREATININE  --   --  0.83 0.78 0.90 0.76 0.78  CALCIUM   --   --  7.8* 8.1* 8.1* 7.9* 8.2*  MG 1.8  --   --   --   --   --   --   PHOS 2.4*  --  4.0  --   --   --   --    < > = values in this interval not displayed.   GFR: Estimated Creatinine Clearance: 120.1 mL/min (by C-G formula based on SCr of 0.78 mg/dL). Liver Function Tests: Recent Labs  Lab 07/02/23 1423 07/03/23 0500  AST 28 29  ALT 50* 48*  ALKPHOS 71 67  BILITOT 1.6* 1.3*  PROT 5.8* 5.5*  ALBUMIN  3.3* 3.3*   Coagulation Profile: Recent Labs  Lab 07/04/23 1318 07/07/23 0359  INR 1.4* 1.1   Sepsis Labs: Recent Labs  Lab 07/02/23 1907  PROCALCITON 0.11    Recent Results (from the past 240 hours)  Culture, blood (Routine X 2) w Reflex to ID Panel     Status: None   Collection Time: 07/02/23 10:15 PM   Specimen: BLOOD RIGHT ARM  Result Value Ref Range Status   Specimen Description   Final    BLOOD RIGHT ARM Performed at [facility], [address], [place], [place] [address]    Special Requests   Final    BOTTLES DRAWN AEROBIC AND ANAEROBIC Blood Culture results may not be optimal due to an inadequate volume of blood received in culture bottles Performed at [facility], [address]  [place], [place] [address]    Culture   Final    NO GROWTH 5 DAYS Performed at [facility], [address] [place], [place] [address]    Report Status 07/07/2023 FINAL  Final  Culture, blood (Routine X 2) w Reflex to ID Panel     Status: None   Collection Time: 07/02/23 10:15 PM   Specimen: BLOOD RIGHT ARM   Result Value Ref Range Status   Specimen Description   Final    BLOOD RIGHT ARM Performed at [facility], [address] [place], [place] [address]    Special Requests   Final    BOTTLES DRAWN AEROBIC AND ANAEROBIC Blood Culture results may not be optimal due to an inadequate volume of blood received in culture bottles Performed at [facility], [address] [place], [place] [address]    Culture   Final    NO GROWTH 5 DAYS Performed at [facility], [address] [place], [place] [address]    Report Status 07/07/2023 FINAL  Final  MRSA Next Gen by PCR, Nasal     Status: None   Collection Time: 07/02/23 11:57 PM   Specimen: Urine, Clean Catch; Nasal Swab  Result Value Ref Range Status   MRSA by PCR Next Gen NOT DETECTED NOT DETECTED Final    Comment: (NOTE) The GeneXpert MRSA Assay (FDA approved for NASAL specimens only), is one component of a comprehensive MRSA colonization surveillance program. It is not intended to diagnose MRSA infection nor to guide or monitor treatment for MRSA infections. Test performance is not FDA approved in patients less than 60 years old. Performed at [facility], [address], [place], [place] [address]   Resp panel by RT-PCR (RSV, Flu A&B, Covid) Anterior Nasal Swab     Status: None   Collection Time: 07/03/23  4:56 PM   Specimen: Anterior Nasal Swab  Result Value Ref Range Status   SARS Coronavirus 2 by RT PCR NEGATIVE NEGATIVE Final    Comment: (NOTE) SARS-CoV-2 target nucleic acids are NOT DETECTED.  The SARS-CoV-2 RNA is generally detectable in upper respiratory specimens during the acute phase of infection. The lowest concentration of SARS-CoV-2 viral copies this assay can detect is 138 copies/mL. A negative result does not preclude SARS-Cov-2 infection and should not be used as the sole basis for treatment or other patient management decisions. A negative result may occur with   improper specimen collection/handling, submission of specimen other than nasopharyngeal swab, presence of viral mutation(s) within the areas targeted by this assay, and inadequate number of viral copies(<138 copies/mL). A negative result must be combined with clinical observations, patient history, and epidemiological information. The expected result is Negative.  Fact Sheet for Patients:  BloggerCourse.com  Fact Sheet for Healthcare Providers:  SeriousBroker.it  This test is no t yet approved or cleared by the [place]  FDA and  has been authorized for detection and/or diagnosis of SARS-CoV-2 by FDA under an Emergency Use Authorization (EUA). This EUA will remain  in effect (meaning this test can be used) for the duration of the COVID-19 declaration under Section 564(b)(1) of the Act, 21 U.S.C.section 360bbb-3(b)(1), unless the authorization is terminated  or revoked sooner.       Influenza A by PCR NEGATIVE NEGATIVE Final   Influenza B by PCR NEGATIVE NEGATIVE Final    Comment: (NOTE) The Xpert Xpress SARS-CoV-2/FLU/RSV plus assay is intended as an aid in the diagnosis of  influenza from Nasopharyngeal swab specimens and should not be used as a sole basis for treatment. Nasal washings and aspirates are unacceptable for Xpert Xpress SARS-CoV-2/FLU/RSV testing.  Fact Sheet for Patients: BloggerCourse.com  Fact Sheet for Healthcare Providers: SeriousBroker.it  This test is not yet approved or cleared by the [place]  FDA and has been authorized for detection and/or diagnosis of SARS-CoV-2 by FDA under an Emergency Use Authorization (EUA). This EUA will remain in effect (meaning this test can be used) for the duration of the COVID-19 declaration under Section 564(b)(1) of the Act, 21 U.S.C. section 360bbb-3(b)(1), unless the authorization is terminated or revoked.      Resp Syncytial Virus by PCR NEGATIVE NEGATIVE Final    Comment: (NOTE) Fact Sheet for Patients: BloggerCourse.com  Fact Sheet for Healthcare Providers: SeriousBroker.it  This test is not yet approved or cleared by the [place]  FDA and has been authorized for detection and/or diagnosis of SARS-CoV-2 by FDA under an Emergency Use Authorization (EUA). This EUA will remain in effect (meaning this test can be used) for the duration of the COVID-19 declaration under Section 564(b)(1) of the Act, 21 U.S.C. section 360bbb-3(b)(1), unless the authorization is terminated or revoked.  Performed at [facility], [address], [place], [place] [address]   Respiratory (~20 pathogens) panel by PCR     Status: None   Collection Time: 07/03/23  4:56 PM   Specimen: Nasopharyngeal Swab; Respiratory  Result Value Ref Range Status   Adenovirus NOT DETECTED NOT DETECTED Final   Coronavirus 229E NOT DETECTED NOT DETECTED Final    Comment: (NOTE) The Coronavirus on the Respiratory Panel, DOES NOT test for the novel  Coronavirus (2019 nCoV)    Coronavirus HKU1 NOT DETECTED NOT DETECTED Final   Coronavirus NL63 NOT DETECTED NOT DETECTED Final   Coronavirus OC43 NOT DETECTED NOT DETECTED Final   Metapneumovirus NOT DETECTED NOT DETECTED Final   Rhinovirus / Enterovirus NOT DETECTED NOT DETECTED Final   Influenza A NOT DETECTED NOT DETECTED Final   Influenza B NOT DETECTED NOT DETECTED Final   Parainfluenza Virus 1 NOT DETECTED NOT DETECTED Final   Parainfluenza Virus 2 NOT DETECTED NOT DETECTED Final   Parainfluenza Virus 3 NOT DETECTED NOT DETECTED Final   Parainfluenza Virus 4 NOT DETECTED NOT DETECTED Final   Respiratory Syncytial Virus NOT DETECTED NOT DETECTED Final   Bordetella pertussis NOT DETECTED NOT DETECTED Final   Bordetella Parapertussis NOT DETECTED NOT DETECTED Final   Chlamydophila pneumoniae NOT DETECTED  NOT DETECTED Final   Mycoplasma pneumoniae NOT DETECTED NOT DETECTED Final    Comment: Performed at [facility], [address] [place], [place] [address]   Scheduled Meds:  amiodarone   200 mg Oral Daily   budesonide -glycopyrrolate -formoterol   2 puff Inhalation BID   feeding supplement  237 mL Oral TID BM   fentaNYL   1 patch Transdermal Q72H   And   fentaNYL   1 patch Transdermal Q72H   gabapentin   100 mg Oral QHS   HYDROmorphone   2 mg Oral Q4H   lidocaine   1 patch Transdermal Q24H   metoprolol  succinate  25 mg Oral Daily   PARoxetine   10 mg Oral QHS   polyethylene glycol  17 g Oral Daily   predniSONE   5 mg Oral Q breakfast   senna-docusate  2 tablet Oral BID   sodium chloride   1 g Oral BID WC   Vitamin D  (Ergocalciferol )  50,000 Units Oral Q7 days   Continuous Infusions:  heparin  1,400 Units/hr (07/08/23 0840)     LOS: 6 days   Time spent: 45 minutes  Unk Garb, DO  [facility]  07/08/2023, 9:52 AM

## 2023-07-08 NOTE — Procedures (Signed)
 Interventional Radiology Procedure Note  Procedure:   Image guided L2 vertebral augmentation kyphoplasty technique.   Complications: None Recommendations:  - Ok to shower tomorrow - Do not submerge for 7 days - Routine wound care - advance diet - ok to restart AC in 6 hours from 4:00pm --> 10pm - bedrest for 2 hours, then ambulate per primary  Signed,  Marciano Settles. Mabel Savage, DO

## 2023-07-08 NOTE — Progress Notes (Signed)
 PHARMACY - ANTICOAGULATION CONSULT NOTE  Pharmacy Consult for Eliquis  >> heparin  Indication: atrial fibrillation  Allergies  Allergen Reactions   Ventolin  [Albuterol ] Other (See Comments)    Patient has had episode of atrial fib following administration of albuterol  (tolerates Xopenex  well)   Lipitor [Atorvastatin ] Other (See Comments)    Arthralgias    Oxycodone  Other (See Comments)    Severe Migranes   Ketamine  Anxiety    "Feeling of doom"    Patient Measurements: Height: 5\' 11"  (180.3 cm) Weight: 106.1 kg (234 lb) IBW/kg (Calculated) : 75.3 HEPARIN  DW (KG): 97.7  Vital Signs: Temp: 98.2 F (36.8 C) (05/22 2006) Temp Source: Oral (05/22 2006) BP: 121/76 (05/23 0523) Pulse Rate: 95 (05/23 0523)  Labs: Recent Labs    07/05/23 1815 07/06/23 0257 07/06/23 0257 07/07/23 0359 07/08/23 0330  HGB  --  10.1*   < > 10.5* 10.5*  HCT  --  30.8*  --  32.0* 32.0*  PLT  --  211  --  234 267  APTT 76* 94*  --  72*  --   LABPROT  --   --   --  14.5  --   INR  --   --   --  1.1  --   HEPARINUNFRC >1.10*  --   --  0.61 0.54  CREATININE  --  0.76  --  0.78  --    < > = values in this interval not displayed.    Estimated Creatinine Clearance: 120.1 mL/min (by C-G formula based on SCr of 0.78 mg/dL).   Medical History: Past Medical History:  Diagnosis Date   Arthritis    Rheumatoid   Bacteremia due to Pseudomonas    C. difficile colitis    Calculus of GB and bile duct w ac and chr cholecyst w obst 06/08/2023   Chronic anxiety 11/08/2014   Cough 09/22/2021   COVID 2020   mild case   Depression    Drug-induced neutropenia (HCC) 08/11/2020   Family history of adverse reaction to anesthesia    brother with seizures had episode under anesthesia.  Patient has seizures as well.   GERD (gastroesophageal reflux disease)    History of radiation therapy 04/24/2020-05/16/2020   IMRT to right lung     Dr Retta Caster   History of radiation therapy    08/10/21-08/19/21-Dr. Retta Caster   History of radiation therapy    Abdomen- 03/21/23-03/31/23-Dr. Retta Caster   Neutropenia [facility]) 05/12/2020   Neutropenic fever (HCC) 05/12/2020   Normocytic anemia 04/18/2021   PAF (paroxysmal atrial fibrillation) (HCC)    CHADS2VSAC score 3   Rheumatoid aortitis    Secondary hypercoagulable state (HCC) 10/28/2020   Sepsis (HCC) 10/06/2020   Shortness of breath 12/18/2020   Squamous cell lung cancer [facility])     Assessment: 63 YO male presenting with worsening back pain and shortness of breath. MRI spine showed osteoporotic compression fractures. Oncology planning for possible vertebroplasty. Patient currently on PTA Eliquis  for atrial fibrillation--last dose was 5mg  on 5/19 PM. Pharmacy consulted for heparin  dosing, pending vertebroplasty procedure.   Today, 07/08/23: Heparin  level 0.54 - therapeutic on heparin  1400 units/hr Hgb 10.5, Pltc wnl No bleeding complications, or infusion interruptions reported Vertebroplasty was scheduled for 5/22, however, insurance is delaying the procedure. Pharmacy will continue to follow for kyphoplasty updates and plans for holding heparin  pre-procedure.   Goal of Therapy:  Heparin  level 0.3-0.7 units/ml Monitor platelets by anticoagulation protocol: Yes   Plan:  Continue heparin  IV  infusion at 1400 units/hr Check heparin  level and CBC daily F/u plans for holding heparin  around kyphoplasty (hold 2h before procedure, per IR)   Roselee Cong, PharmD Clinical Pharmacist  5/23/20257:02 AM

## 2023-07-08 NOTE — Progress Notes (Addendum)
 Patient transported off unit in-route to IR at [facility] for kyphoplasty via [facility].   Patient alert and oriented x 4, 3L O2 via [place], VS stable. Report given to IR nurse Jenine Mix. IR procedure consent given to [facility] to transport with patient.

## 2023-07-08 NOTE — Progress Notes (Addendum)
 PHARMACY - ANTICOAGULATION CONSULT NOTE  Pharmacy Consult for Eliquis  >> heparin  Indication: atrial fibrillation  Allergies  Allergen Reactions   Ventolin  [Albuterol ] Other (See Comments)    Patient has had episode of atrial fib following administration of albuterol  (tolerates Xopenex  well)   Lipitor [Atorvastatin ] Other (See Comments)    Arthralgias    Oxycodone  Other (See Comments)    Severe Migranes   Ketamine  Anxiety    "Feeling of doom"    Patient Measurements: Height: 5\' 11"  (180.3 cm) Weight: 106.1 kg (234 lb) IBW/kg (Calculated) : 75.3 HEPARIN  DW (KG): 97.7  Vital Signs: Temp: 98.2 F (36.8 C) (05/23 1716) Temp Source: Oral (05/23 1716) BP: 98/79 (05/23 1716) Pulse Rate: 96 (05/23 1716)  Labs: Recent Labs    07/05/23 1815 07/06/23 0257 07/06/23 0257 07/07/23 0359 07/08/23 0330  HGB  --  10.1*   < > 10.5* 10.5*  HCT  --  30.8*  --  32.0* 32.0*  PLT  --  211  --  234 267  APTT 76* 94*  --  72*  --   LABPROT  --   --   --  14.5  --   INR  --   --   --  1.1  --   HEPARINUNFRC >1.10*  --   --  0.61 0.54  CREATININE  --  0.76  --  0.78  --    < > = values in this interval not displayed.    Estimated Creatinine Clearance: 120.1 mL/min (by C-G formula based on SCr of 0.78 mg/dL).   Medical History: Past Medical History:  Diagnosis Date   Arthritis    Rheumatoid   Bacteremia due to Pseudomonas    C. difficile colitis    Calculus of GB and bile duct w ac and chr cholecyst w obst 06/08/2023   Chronic anxiety 11/08/2014   Cough 09/22/2021   COVID 2020   mild case   Depression    Drug-induced neutropenia (HCC) 08/11/2020   Family history of adverse reaction to anesthesia    brother with seizures had episode under anesthesia.  Patient has seizures as well.   GERD (gastroesophageal reflux disease)    History of radiation therapy 04/24/2020-05/16/2020   IMRT to right lung     Dr Retta Caster   History of radiation therapy    08/10/21-08/19/21-Dr. Retta Caster   History of radiation therapy    Abdomen- 03/21/23-03/31/23-Dr. Retta Caster   Neutropenia [facility]) 05/12/2020   Neutropenic fever (HCC) 05/12/2020   Normocytic anemia 04/18/2021   PAF (paroxysmal atrial fibrillation) (HCC)    CHADS2VSAC score 3   Rheumatoid aortitis    Secondary hypercoagulable state (HCC) 10/28/2020   Sepsis (HCC) 10/06/2020   Shortness of breath 12/18/2020   Squamous cell lung cancer [facility])     Assessment: 63 YO male presenting with worsening back pain and shortness of breath. MRI spine showed osteoporotic compression fractures. Oncology planning for possible vertebroplasty. Patient currently on PTA Eliquis  for atrial fibrillation--last dose was 5mg  on 5/19 PM. Pharmacy consulted for heparin  dosing, pending vertebroplasty procedure.   Today, 07/08/23: 03:30 Heparin  level 0.54 - therapeutic on heparin  1400 units/hr Hgb 10.5-stable, Pltc wnl No bleeding complications reported Image guided L2 vertebral augmentation kyphoplasty performed   Goal of Therapy:  Heparin  level 0.3-0.7 units/ml Monitor platelets by anticoagulation protocol: Yes   Plan:  Per IR, resume heparin  IV infusion at 1400 units/hr this evening at 2200 Monitor daily heparin  level, CBC, signs/symptoms of bleeding F/U when  to transition back to Eliquis    Thank you for allowing pharmacy to be a part of this patient's care.  Alfredo Inch, PharmD, BCPS Clinical Pharmacist [place] 07/08/2023 5:22 PM  Addendum: 5/23 1745 Received order from attending to change to apixaban  at 2200 tonight  Plan: D/C IV heparin  consult and associated labs At 2200, begin apixaban  5 mg po BID Monitor CBC as ordered by provider along with signs/symptoms of bleeding  Thank you for allowing pharmacy to be a part of this patient's care.  Alfredo Inch, PharmD, BCPS Clinical Pharmacist [place] 07/08/2023 6:07 PM

## 2023-07-09 ENCOUNTER — Other Ambulatory Visit (HOSPITAL_COMMUNITY): Payer: Self-pay

## 2023-07-09 DIAGNOSIS — S22080S Wedge compression fracture of T11-T12 vertebra, sequela: Secondary | ICD-10-CM | POA: Diagnosis not present

## 2023-07-09 DIAGNOSIS — G893 Neoplasm related pain (acute) (chronic): Secondary | ICD-10-CM | POA: Diagnosis not present

## 2023-07-09 DIAGNOSIS — C7971 Secondary malignant neoplasm of right adrenal gland: Secondary | ICD-10-CM | POA: Diagnosis not present

## 2023-07-09 DIAGNOSIS — J9621 Acute and chronic respiratory failure with hypoxia: Secondary | ICD-10-CM | POA: Diagnosis not present

## 2023-07-09 DIAGNOSIS — Z9889 Other specified postprocedural states: Secondary | ICD-10-CM

## 2023-07-09 DIAGNOSIS — Z515 Encounter for palliative care: Secondary | ICD-10-CM | POA: Diagnosis not present

## 2023-07-09 DIAGNOSIS — M549 Dorsalgia, unspecified: Secondary | ICD-10-CM | POA: Diagnosis not present

## 2023-07-09 LAB — BASIC METABOLIC PANEL WITH GFR
Anion gap: 8 (ref 5–15)
BUN: 17 mg/dL (ref 8–23)
CO2: 26 mmol/L (ref 22–32)
Calcium: 8.3 mg/dL — ABNORMAL LOW (ref 8.9–10.3)
Chloride: 97 mmol/L — ABNORMAL LOW (ref 98–111)
Creatinine, Ser: 0.91 mg/dL (ref 0.61–1.24)
GFR, Estimated: >60 mL/min (ref >60)
Glucose, Bld: 113 mg/dL — ABNORMAL HIGH (ref 70–99)
Potassium: 4 mmol/L (ref 3.5–5.1)
Sodium: 131 mmol/L — ABNORMAL LOW (ref 135–145)

## 2023-07-09 MED ORDER — FENTANYL 25 MCG/HR TD PT72
1.0000 | MEDICATED_PATCH | TRANSDERMAL | Status: DC
  Administered 2023-07-09 – 2023-07-18 (×4): 1 via TRANSDERMAL
  Filled 2023-07-09 (×4): qty 1

## 2023-07-09 MED ORDER — FENTANYL 12 MCG/HR TD PT72
1.0000 | MEDICATED_PATCH | TRANSDERMAL | Status: DC
  Administered 2023-07-09 – 2023-07-15 (×3): 1 via TRANSDERMAL
  Filled 2023-07-09 (×3): qty 1

## 2023-07-09 NOTE — Plan of Care (Signed)
  Problem: Clinical Measurements: Goal: Will remain free from infection Outcome: Progressing   Problem: Clinical Measurements: Goal: Diagnostic test results will improve Outcome: Progressing   Problem: Safety: Goal: Ability to remain free from injury will improve Outcome: Progressing   Problem: Skin Integrity: Goal: Risk for impaired skin integrity will decrease Outcome: Progressing   Problem: Health Behavior/Discharge Planning: Goal: Ability to manage health-related needs will improve Outcome: Adequate for Discharge   Problem: Clinical Measurements: Goal: Ability to maintain clinical measurements within normal limits will improve Outcome: Adequate for Discharge Goal: Respiratory complications will improve Outcome: Adequate for Discharge Goal: Cardiovascular complication will be avoided Outcome: Adequate for Discharge   Problem: Activity: Goal: Risk for activity intolerance will decrease Outcome: Adequate for Discharge   Problem: Nutrition: Goal: Adequate nutrition will be maintained Outcome: Adequate for Discharge   Problem: Coping: Goal: Level of anxiety will decrease Outcome: Adequate for Discharge   Problem: Elimination: Goal: Will not experience complications related to bowel motility Outcome: Adequate for Discharge Goal: Will not experience complications related to urinary retention Outcome: Adequate for Discharge   Problem: Pain Managment: Goal: General experience of comfort will improve and/or be controlled Outcome: Adequate for Discharge

## 2023-07-09 NOTE — Evaluation (Signed)
 Physical Therapy Evaluation Patient Details Name: John [name] MRN: [id number] DOB: 1961-02-13 Today's Date: 07/09/2023  History of Present Illness  Pt is 63 yo male who presented to the ED for evaluation of worsening back pain and shortness of breath. pt is s/p KP L2 on  5/23. 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  Clinical Impression  Pt admitted with above diagnosis.  Pt motivated to work with PT, was independent at baseline (prior to recent hospitalization). Today pt presents with global weakness/deconditioning, extensive weakness & diminished coordination LLE and is at risk for falls.  Pt was able to perform STS x2 with +2 mod assist, noted knee buckling LLE.  Patient will benefit from continued inpatient follow up therapy, <3 hours/day at d/c  Spouse supportive, present for session  Pt currently with functional limitations due to the deficits listed below (see PT Problem List). Pt will benefit from acute skilled PT to increase their independence and safety with mobility to allow discharge.           If plan is discharge home, recommend the following: A lot of help with walking and/or transfers;Two people to help with walking and/or transfers;Assistance with cooking/housework;Help with stairs or ramp for entrance;Assist for transportation   Can travel by private vehicle   No    Equipment Recommendations None recommended by PT  Recommendations for Other Services       Functional Status Assessment Patient has had a recent decline in their functional status and demonstrates the ability to make significant improvements in function in a reasonable and predictable amount of time.     Precautions /  Restrictions Precautions Precautions: Fall;Back Recall of Precautions/Restrictions: Impaired Precaution/Restrictions Comments: requires cues to maintain back precautions Restrictions Weight Bearing Restrictions Per Provider Order: No      Mobility  Bed Mobility Overal bed mobility: Needs Assistance Bed Mobility: Rolling, Sidelying to Sit Rolling: Min assist Sidelying to sit: Min assist, +2 for safety/equipment, +2 for physical assistance       General bed mobility comments: multi-modal cues for log roll technique and self assist; assist to progress LEs off bed and elevate trunk    Transfers Overall transfer level: Needs assistance Equipment used: Rolling walker (2 wheels) Transfers: Sit to/from Stand Sit to Stand: Mod assist, +2 physical assistance, +2 safety/equipment, From elevated surface           General transfer comment: STS x2; assist to rise and transition to RW. able to maintain standing ~ 30 sec each trial; L knee buckling when L knee not locked in full extension. pt able to scoot along EOB with supervision. fatigues easily    Ambulation/Gait               General Gait Details: deferred for pt and staff safety d/t global weakness, LLE weakness/knee buckling  Stairs            Wheelchair Mobility     Tilt Bed    Modified Rankin (Stroke Patients Only)       Balance Overall balance assessment: Needs assistance Sitting-balance support: Bilateral upper extremity supported, Feet supported Sitting balance-Leahy Scale: Fair     Standing balance support: Reliant on assistive device for balance, During functional activity Standing balance-Leahy Scale: Zero Standing balance comment: reliant on  UEs and external support;  anterior lean                             Pertinent Vitals/Pain Pain Assessment Pain Assessment: Faces Faces Pain Scale: No hurt Pain Location: back Pain Intervention(s): Monitored during session    Home Living  Family/patient expects to be discharged to:: Private residence Living Arrangements: Spouse/significant other Available Help at Discharge: Family;Available PRN/intermittently Type of Home: House Home Access: Stairs to enter Entrance Stairs-Rails: Left Entrance Stairs-Number of Steps: 4   Home Layout: One level Home Equipment: Agricultural consultant (2 wheels);BSC/3in1;Shower seat;Hand held shower head;Wheelchair - manual Additional Comments: works from home and wife able to work from home    Prior Function Prior Level of Function : Independent/Modified Independent             Mobility Comments: uses rw; O2 dependent at 3L. recently needing more assist. (since recent hospital admission - choleycystectomy ADLs Comments: uses O2 at home prn 3 lts, independent pta     Extremity/Trunk Assessment   Upper Extremity Assessment Upper Extremity Assessment: Defer to OT evaluation;Generalized weakness    Lower Extremity Assessment Lower Extremity Assessment: RLE deficits/detail;LLE deficits/detail RLE Deficits / Details: MMT ankle 4+/5, knee and hip 4+/5 RLE Sensation: history of peripheral neuropathy RLE Coordination: decreased gross motor LLE Deficits / Details: ankle pf/df 3+/5; knee extension 2+/5, knee flexion 3/5;  hip flexion 2+/5 LLE Coordination: decreased gross motor;decreased fine motor       Communication   Communication Communication: No apparent difficulties    Cognition Arousal: Alert Behavior During Therapy: WFL for tasks assessed/performed, Impulsive   PT - Cognitive impairments: Safety/Judgement                         Following commands: Intact       Cueing Cueing Techniques: Verbal cues, Tactile cues     General Comments      Exercises General Exercises - Lower Extremity Ankle Circles/Pumps: AROM, Both, 5 reps Heel Slides: AAROM, Both, 5 reps (instructed in slow and controlled heel slides, use of gait belt on LLE to avoid hip external rotation  and control speed of movment)   Assessment/Plan    PT Assessment Patient needs continued PT services  PT Problem List Decreased strength;Decreased activity tolerance;Decreased balance;Decreased knowledge of use of DME;Decreased mobility;Decreased safety awareness;Decreased knowledge of precautions       PT Treatment Interventions DME instruction;Gait training;Functional mobility training;Therapeutic activities;Patient/family education;Therapeutic exercise;Balance training    PT Goals (Current goals can be found in the Care Plan section)  Acute Rehab PT Goals Patient Stated Goal: to get stronger PT Goal Formulation: With patient/family Time For Goal Achievement: 07/22/23 Potential to Achieve Goals: Good    Frequency Min 3X/week     Co-evaluation               AM-PAC PT "6 Clicks" Mobility  Outcome Measure Help needed turning from your back to your side while in a flat bed without using bedrails?: A Little Help needed moving from lying on your back to sitting on the side of a flat bed without using bedrails?: A Lot Help needed moving to and from a bed to a chair (including a wheelchair)?: Total Help needed standing up from a chair using your arms (e.g., wheelchair or bedside chair)?: Total Help needed to walk in hospital room?: Total Help needed climbing 3-5 steps with a railing? : Total 6  Click Score: 9    End of Session Equipment Utilized During Treatment: Gait belt Activity Tolerance: Patient tolerated treatment well;Patient limited by fatigue Patient left: in bed;with call bell/phone within reach;with bed alarm set;with family/visitor present   PT Visit Diagnosis: Other abnormalities of gait and mobility (R26.89);History of falling (Z91.81);Muscle weakness (generalized) (M62.81)    Time: 1610-9604 PT Time Calculation (min) (ACUTE ONLY): 26 min   Charges:   PT Evaluation $PT Eval Low Complexity: 1 Low PT Treatments $Therapeutic Activity: 8-22 mins PT General  Charges $$ ACUTE PT VISIT: 1 Visit         Bleu Moisan, PT  Acute Rehab Dept [facility]) [phone]  07/09/2023   [facility] 07/09/2023, 3:23 PM

## 2023-07-09 NOTE — Progress Notes (Signed)
 Old fentanyl  patch removed and wasted with night shift RN, Landa Pine.  New patches applied.

## 2023-07-09 NOTE — Assessment & Plan Note (Addendum)
 07-09-2023 had L2 kyphoplasty yesterday. Continue prn IV dilaudid . Start PT.  07-11-2023 still using IV dilaudid  prn. On scheduled fentanyl  patch and prn oral dilaudid . Will continue IV dilaudid  prn for now.  07-12-2023 still using occ IV dilaudid  prn. Will continue with prn IV dilaudid . Continue with scheduled fentanyl  patch and po dilaudid .  Needs SNF for PT.

## 2023-07-09 NOTE — Progress Notes (Signed)
 PROGRESS NOTE    John [name]  ZOX:[id number] DOB: 1961-01-14 DOA: 07/02/2023 PCP: Patient, No Pcp Per  Subjective: Pt seen and examined. Pt had his kyphoplasty yesterday after his insurance company denied authorization. Pt states he is going to pay for procedure out-of-pocket.  Pt's back feels better. Still on IV dilaudid , po dilaudid  and fentanyl  patch. Awaiting PT evaluation.    Hospital Course: HPI: John [name] is a 63 y.o. 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  Patient reports she has had back pain for months but the pain was too severe today to tolerate.  His home Percocet is not controlling his pain.  He describes the pain as sharp in his low back, radiating to both legs. He also reports difficulty breathing today.  On EMS arrival, patient was found to have SpO2 of 88% which improved with 6 L [place]. He reports difficulty walking due to the pain, poor appetite and feeling thirsty but denies any fevers, chills, cough, nausea, vomiting, abdominal pain, headache or dizziness.   ED Course: Initial vitals show temp 98.1, RR 19, HR 90, BP 161/101 and SpO2 95% on 6 L .  Labs significant for sodium 124, K+ 3.3, creatinine 0.87, WBC 1.3 (from 4.3 1-day ago), Hgb 11.3, platelet 175, mag 1.8, Phos 2.4. CXR shows right basilar consolidation concerning for acute airway disease versus atelectasis. CTA PE study negative for PE but shows signs of possible multifocal pneumonia. Patient received IV morphine  1 mg x 2, IV ketamine  32 mg x 1, Toradol  15 mg x 1, IV NS 500 cc x 1, IV cefepime  and IV vancomycin . TRH was consulted  for admission.  Significant Events: Admitted 07/02/2023 acute on chronic respiratory failure with hypoxia   Significant Labs: Covid/flu/RSV negative RVP negative Na 124, K 3.5, CO2 of 19, BUN 14, Scr 0.78, glu 105 INR 1.4 WBC 1.0, HgB 11.2, plt 205 Legionella urinary antigen Negative Strep Pneumo antigen Negative  Significant Imaging Studies: CXR Increasing right basilar consolidation superimposed upon chronic post radiation fibrosis, which may reflect acute airspace disease or atelectasis. CTPA No evidence for pulmonary embolism. 2. Stable multifocal areas of ground-glass opacity throughout both lungs along with some peripheral reticular opacities. Findings are favored as infectious/inflammatory. 3. Stable focal airspace opacity in the right perihilar region and superior segment of the right lower lobe with some central cavitation. Findings may be  infectious/inflammatory. 4. Stable 3.3 cm infrarenal abdominal aortic aneurysm. Recommend follow-up ultrasound every 3 years. Aortic Atherosclerosis (ICD10-I70.0) and Emphysema (ICD10-J43.9)  CT T-spine Chronic compression deformities from T10 through L2, with evidence of prior vertebral augmentation at T12 and L1. No acute thoracic spine fracture. Abd XR No significant formed stool in the colon. 2. Air scattered throughout nondilated small bowel, query ileus Pelvic XR Mild bilateral femoroacetabular osteoarthritis  MRI T-spine No evidence for metastatic disease within the thoracic spine. 2. Evolving subacute compression fracture involving the superior endplate of T11 with no more than mild residual marrow edema. Height loss is not significantly changed or progressed as compared to prior MRI from 06/06/2023. 3. Chronic compression deformities involving the T12 and L1 vertebral bodies with sequelae of prior vertebral augmentation, stable. 4. Small disc  protrusions at T6-7 and T7-8 without significant stenosis. Moderate right with mild left foraminal  stenosis at T10-11. 5. Post treatment changes within the visualized right lung with known right adrenal metastasis, described on recent CTs. MRI L-spine Acute compression fracture involving the inferior endplate of L2 with up to 60% height loss without significant bony retropulsion. This is superimposed on an underlying chronic L2 compression fracture, and is benign/mechanical in appearance. No evidence for metastatic disease within the lumbar spine. 2. Additional chronic compression deformities involving the T12, L1, L3, L4, and L5 vertebral bodies, otherwise stable. 3. Multifactorial degenerative changes at L4-5 with resultant moderate canal and left greater than right lateral recess stenosis, with mild to moderate bilateral L4 foraminal narrowing. 4. Left foraminal to extraforaminal disc protrusion at L3-4, potentially affecting the exiting left L3 nerve root. 5. 3.7 cm intra-abdominal aortic aneurysm. Recommend follow-up every 2 years.   Antibiotic Therapy: Anti-infectives (From admission, onward)    Start     Dose/Rate Route Frequency Ordered Stop   07/07/23 0600  ceFAZolin  (ANCEF ) IVPB 2g/100 mL premix       Note to Pharmacy: Patient is at [facility] and will transfer round-trip to [facility] for procedure.   2 g 200 mL/hr over 30 Minutes Intravenous To Radiology 07/05/23 1734 07/08/23 0600   07/03/23 0800  vancomycin  (VANCOCIN ) IVPB 1000 mg/200 mL premix  Status:  Discontinued        1,000 mg 200 mL/hr over 60 Minutes Intravenous Every 12 hours 07/02/23 2146 07/03/23 0722   07/03/23 0200  ceFEPIme  (MAXIPIME ) 2 g in sodium chloride  0.9 % 100 mL IVPB        2 g 200 mL/hr over 30 Minutes Intravenous Every 8 hours 07/02/23 2141     07/02/23 1845  vancomycin  (VANCOREADY) IVPB 2000 mg/400 mL        2,000 mg 200 mL/hr over 120 Minutes Intravenous  Once 07/02/23 1813 07/02/23 2120   07/02/23 1815  vancomycin  (VANCOCIN ) IVPB 1000 mg/200 mL premix  Status:  Discontinued        1,000 mg 200 mL/hr over 60  Minutes Intravenous  Once 07/02/23 1807 07/02/23 1813   07/02/23 1815  ceFEPIme  (MAXIPIME ) 2 g in sodium chloride  0.9 % 100 mL IVPB        2 g 200 mL/hr over 30 Minutes Intravenous  Once 07/02/23 1807 07/02/23 1911       Procedures: 07-08-2023 lumbar L2 kyphoplasty  Consultants: Palliative care Heme/onc IR    Assessment and Plan: * Acute on chronic respiratory failure with hypoxia (HCC) 07-02-2023 through 07-05-2023 Uses 3 L nasal cannula O2 at baseline.  He desaturated to 83% and required up to 15 L high flow nasal cannula.  Currently on 4 L nasal cannula O2 - MRSA PCR negative - Strep pneumo antigen negative - Legionella antigen pending - COVID, RSV, influenza, respiratory viral panel negative  - Empiric cefepime   07-06-2023 no evidence of pneumonia. Pt was on empiric cefepime  due to neutropenia. No fevers. Discussed with oncology. Will stop IV Cefepime .  07-07-2023 stable on 3 L/min. Abx stopped on 07-06-2023.  07-08-2023 stable. On 3 L/min.  07-09-2023 stable on 3 L/min.  Pain of metastatic malignancy 07-06-2023 continue with IV dilaudid  prn. Has fentanyl  patch.  07-07-2023 continue with IV dilaudid  prn. Continue with fentanyl  patch.  07-08-2023 continue with IV dilaudid  prn. On fentanyl  patch scheduled and prn oral dilaudid .  07-09-2023 continue with IV dilaudid  prn. On fentanyl  patch scheduled and prn oral dilaudid .  Intractable back  pain 07-02-2023 through 07-06-2023 due to vertebral compression fractures.  Appreciate palliative care medicine for symptom management.  Started fentanyl  patch, Dilaudid , Lidoderm  patch, Robaxin , gabapentin  - CT thoracic spine revealed chronic compression deformities T10-L2.  He did have kyphoplasty of T12 back in July 2023, and kyphoplasty L1 and L3 in August 2023 - MRI thoracic and lumbar spine revealed L2 compression fracture - IR consulted for kyphoplasty  07-06-2023 Was on schedule for have kyphoplasty tomorrow. Just notified by  IR that pt's insurance company wants more information regarding his back pain. Continue with IV dilaudid  prn.  07-07-2023 still awaiting on insurance authorization. Continue with prn IV dilaudid  for pain control.  PT assessment cannot being until after his kyphoplasty.  07-08-2023 Pt and wife are expectedly upset that insurance authorization has not been obtained. I explained to both of them that providers have no control on what insurance companies authorize or reject. Pt states he called insurance company and he states insurance company claims they don't have any requests for kyphoplasty authorization. I have reached out to IR APP to make sure that request for authorization has been sent by radiology group to pt's insurance company.  Continue IV Dilaudid  prn.   07-09-2023 had L2 kyphoplasty yesterday. Continue prn IV dilaudid . Start PT.  S/P kyphoplasty - L2 on 07-08-2023 07-09-2023 had L2 kyphoplasty yesterday. Continue prn IV dilaudid . Start PT.  Obesity, Class I, BMI 30-34.9 Body mass index is 32.64 kg/m.   PAF (paroxysmal atrial fibrillation) (HCC) 07-06-2023 continue with IV heparin  gtts pending his kyphoplasty. On Toprol  XL  07-07-2023 stable. Remains on IV heparin  gtts.  07-08-2023 continue IV heparin  gtts. Awaiting pt to complete kyphoplasty prior to restarting po Eliquis   07-09-2023 eliquis  restarted last night. Stable.  SIADH (syndrome of inappropriate ADH production) (HCC) 07-06-2023 Na stable. Na 128. - Urine osmole 575  - Urine sodium 129 - Salt tabs and fluid restriction - Improved  07-07-2023 Na 130 today.   07-09-2023 stable. Na 131 today.  Hyponatremia 07-06-2023 Na stable. Na 128. - Urine osmole 575  - Urine sodium 129 - Salt tabs and fluid restriction - Improved  07-07-2023 Na 130 today.  07-08-2023 stable.  07-09-2023 stable. Na 131 today.  Stage 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 (HCC) 07-06-2023 - Currently under active therapy with  Gemzar   and Navelbine  - Followed by Dr. Maria Shiner  07-07-2023 stable.  07-08-2023 oncology following.  07-09-2023 followed by oncology   DVT prophylaxis:  apixaban  (ELIQUIS ) tablet 5 mg     Code Status: Full Code Family Communication: no family at bedside. Spoke with wife yesterday. Pt is decisional. Disposition Plan: unknown Reason for continuing need for hospitalization: awaiting PT assessment.  Objective: Vitals:   07/08/23 1716 07/08/23 2109 07/09/23 0208 07/09/23 0904  BP: 98/79 125/83 114/79   Pulse: 96 97 96   Resp: 16     Temp: 98.2 F (36.8 C) (!) 97.5 F (36.4 C) 98.2 F (36.8 C)   TempSrc: Oral Oral Oral   SpO2: 92% 94% 92% (!) 86%  Weight:      Height:        Intake/Output Summary (Last 24 hours) at 07/09/2023 1245 Last data filed at 07/09/2023 1100 Gross per 24 hour  Intake 189.19 ml  Output 150 ml  Net 39.19 ml   Filed Weights   07/03/23 0155  Weight: 106.1 kg    Examination:  Physical Exam Vitals and nursing note reviewed.  Constitutional:      Appearance: He is obese.  HENT:     Head: Normocephalic and atraumatic.     Nose: Nose normal.  Cardiovascular:     Rate and Rhythm: Normal rate and regular rhythm.  Pulmonary:     Effort: Pulmonary effort is normal.     Breath sounds: Normal breath sounds.  Abdominal:     General: Bowel sounds are normal.     Palpations: Abdomen is soft.  Skin:    General: Skin is warm and dry.     Capillary Refill: Capillary refill takes less than 2 seconds.  Neurological:     Mental Status: He is alert and oriented to person, place, and time.     Data Reviewed: I have personally reviewed following labs and imaging studies  CBC: Recent Labs  Lab 07/02/23 1423 07/03/23 0500 07/04/23 0331 07/05/23 0428 07/06/23 0257 07/07/23 0359 07/08/23 0330  WBC 1.3*   < > 1.0* 1.0* 1.3* 2.1* 3.0*  NEUTROABS 0.6*  --   --   --  0.2* 0.6*  --   HGB 11.3*   < > 11.4* 11.2* 10.1* 10.5* 10.5*  HCT 33.3*   < > 31.8* 33.4*  30.8* 32.0* 32.0*  MCV 89.3   < > 88.1 92.5 91.9 92.8 93.3  PLT 175   < > 188 205 211 234 267   < > = values in this interval not displayed.   Basic Metabolic Panel: Recent Labs  Lab 07/02/23 1907 07/02/23 2330 07/03/23 0500 07/04/23 0331 07/05/23 0428 07/06/23 0257 07/07/23 0359 07/09/23 0409  NA  --    < > 123* 124* 131* 128* 130* 131*  K  --   --  3.8 3.5 3.7 3.4* 4.2 4.0  CL  --   --  91* 95* 103 103 101 97*  CO2  --   --  22 19* 20* 19* 22 26  GLUCOSE  --   --  121* 105* 107* 125* 107* 113*  BUN  --   --  17 14 17 17 14 17   CREATININE  --   --  0.83 0.78 0.90 0.76 0.78 0.91  CALCIUM   --   --  7.8* 8.1* 8.1* 7.9* 8.2* 8.3*  MG 1.8  --   --   --   --   --   --   --   PHOS 2.4*  --  4.0  --   --   --   --   --    < > = values in this interval not displayed.   GFR: Estimated Creatinine Clearance: 105.6 mL/min (by C-G formula based on SCr of 0.91 mg/dL). Liver Function Tests: Recent Labs  Lab 07/02/23 1423 07/03/23 0500  AST 28 29  ALT 50* 48*  ALKPHOS 71 67  BILITOT 1.6* 1.3*  PROT 5.8* 5.5*  ALBUMIN  3.3* 3.3*   Coagulation Profile: Recent Labs  Lab 07/04/23 1318 07/07/23 0359  INR 1.4* 1.1   Sepsis Labs: Recent Labs  Lab 07/02/23 1907  PROCALCITON 0.11    Recent Results (from the past 240 hours)  Culture, blood (Routine X 2) w Reflex to ID Panel     Status: None   Collection Time: 07/02/23 10:15 PM   Specimen: BLOOD RIGHT ARM  Result Value Ref Range Status   Specimen Description   Final    BLOOD RIGHT ARM Performed at [facility], [address], [place], [place] [address]    Special Requests   Final    BOTTLES DRAWN AEROBIC AND ANAEROBIC Blood  Culture results may not be optimal due to an inadequate volume of blood received in culture bottles Performed at [facility], [address], [place], [place] [address]    Culture   Final    NO GROWTH 5 DAYS Performed at [facility], [address] [place], [place]  [address]    Report Status 07/07/2023 FINAL  Final  Culture, blood (Routine X 2) w Reflex to ID Panel     Status: None   Collection Time: 07/02/23 10:15 PM   Specimen: BLOOD RIGHT ARM  Result Value Ref Range Status   Specimen Description   Final    BLOOD RIGHT ARM Performed at [facility], [address] [place], [place] [address]    Special Requests   Final    BOTTLES DRAWN AEROBIC AND ANAEROBIC Blood Culture results may not be optimal due to an inadequate volume of blood received in culture bottles Performed at [facility], [address] [place], [place] [address]    Culture   Final    NO GROWTH 5 DAYS Performed at [facility], [address], [place], [place] [address]    Report Status 07/07/2023 FINAL  Final  MRSA Next Gen by PCR, Nasal     Status: None   Collection Time: 07/02/23 11:57 PM   Specimen: Urine, Clean Catch; Nasal Swab  Result Value Ref Range Status   MRSA by PCR Next Gen NOT DETECTED NOT DETECTED Final    Comment: (NOTE) The GeneXpert MRSA Assay (FDA approved for NASAL specimens only), is one component of a comprehensive MRSA colonization surveillance program. It is not intended to diagnose MRSA infection nor to guide or monitor treatment for MRSA infections. Test performance is not FDA approved in patients less than 48 years old. Performed at [facility], [address], [place], [place] [address]   Resp panel by RT-PCR (RSV, Flu A&B, Covid) Anterior Nasal Swab     Status: None   Collection Time: 07/03/23  4:56 PM   Specimen: Anterior Nasal Swab  Result Value Ref Range Status   SARS Coronavirus 2 by RT PCR NEGATIVE NEGATIVE Final    Comment: (NOTE) SARS-CoV-2 target nucleic acids are NOT DETECTED.  The SARS-CoV-2 RNA is generally detectable in upper respiratory specimens during the acute phase of infection. The lowest concentration of SARS-CoV-2 viral copies this assay can detect is 138 copies/mL. A  negative result does not preclude SARS-Cov-2 infection and should not be used as the sole basis for treatment or other patient management decisions. A negative result may occur with  improper specimen collection/handling, submission of specimen other than nasopharyngeal swab, presence of viral mutation(s) within the areas targeted by this assay, and inadequate number of viral copies(<138 copies/mL). A negative result must be combined with clinical observations, patient history, and epidemiological information. The expected result is Negative.  Fact Sheet for Patients:  BloggerCourse.com  Fact Sheet for Healthcare Providers:  SeriousBroker.it  This test is no t yet approved or cleared by the [place]  FDA and  has been authorized for detection and/or diagnosis of SARS-CoV-2 by FDA under an Emergency Use Authorization (EUA). This EUA will remain  in effect (meaning this test can be used) for the duration of the COVID-19 declaration under Section 564(b)(1) of the Act, 21 U.S.C.section 360bbb-3(b)(1), unless the authorization is terminated  or revoked sooner.       Influenza A by PCR NEGATIVE NEGATIVE Final  Influenza B by PCR NEGATIVE NEGATIVE Final    Comment: (NOTE) The Xpert Xpress SARS-CoV-2/FLU/RSV plus assay is intended as an aid in the diagnosis of influenza from Nasopharyngeal swab specimens and should not be used as a sole basis for treatment. Nasal washings and aspirates are unacceptable for Xpert Xpress SARS-CoV-2/FLU/RSV testing.  Fact Sheet for Patients: BloggerCourse.com  Fact Sheet for Healthcare Providers: SeriousBroker.it  This test is not yet approved or cleared by the [place]  FDA and has been authorized for detection and/or diagnosis of SARS-CoV-2 by FDA under an Emergency Use Authorization (EUA). This EUA will remain in effect (meaning this test can  be used) for the duration of the COVID-19 declaration under Section 564(b)(1) of the Act, 21 U.S.C. section 360bbb-3(b)(1), unless the authorization is terminated or revoked.     Resp Syncytial Virus by PCR NEGATIVE NEGATIVE Final    Comment: (NOTE) Fact Sheet for Patients: BloggerCourse.com  Fact Sheet for Healthcare Providers: SeriousBroker.it  This test is not yet approved or cleared by the [place]  FDA and has been authorized for detection and/or diagnosis of SARS-CoV-2 by FDA under an Emergency Use Authorization (EUA). This EUA will remain in effect (meaning this test can be used) for the duration of the COVID-19 declaration under Section 564(b)(1) of the Act, 21 U.S.C. section 360bbb-3(b)(1), unless the authorization is terminated or revoked.  Performed at [facility], [address] [place], [place] [address]   Respiratory (~20 pathogens) panel by PCR     Status: None   Collection Time: 07/03/23  4:56 PM   Specimen: Nasopharyngeal Swab; Respiratory  Result Value Ref Range Status   Adenovirus NOT DETECTED NOT DETECTED Final   Coronavirus 229E NOT DETECTED NOT DETECTED Final    Comment: (NOTE) The Coronavirus on the Respiratory Panel, DOES NOT test for the novel  Coronavirus (2019 nCoV)    Coronavirus HKU1 NOT DETECTED NOT DETECTED Final   Coronavirus NL63 NOT DETECTED NOT DETECTED Final   Coronavirus OC43 NOT DETECTED NOT DETECTED Final   Metapneumovirus NOT DETECTED NOT DETECTED Final   Rhinovirus / Enterovirus NOT DETECTED NOT DETECTED Final   Influenza A NOT DETECTED NOT DETECTED Final   Influenza B NOT DETECTED NOT DETECTED Final   Parainfluenza Virus 1 NOT DETECTED NOT DETECTED Final   Parainfluenza Virus 2 NOT DETECTED NOT DETECTED Final   Parainfluenza Virus 3 NOT DETECTED NOT DETECTED Final   Parainfluenza Virus 4 NOT DETECTED NOT DETECTED Final   Respiratory Syncytial Virus NOT  DETECTED NOT DETECTED Final   Bordetella pertussis NOT DETECTED NOT DETECTED Final   Bordetella Parapertussis NOT DETECTED NOT DETECTED Final   Chlamydophila pneumoniae NOT DETECTED NOT DETECTED Final   Mycoplasma pneumoniae NOT DETECTED NOT DETECTED Final    Comment: Performed at [facility], [address], [place], [place] [address]     Radiology Studies: IR KYPHO LUMBAR INC FX REDUCE BONE BX UNI/BIL CANNULATION INC/IMAGING Result Date: 07/08/2023 INDICATION: 63 year old male presents for treatment of L2 compression fracture EXAM: IR KYPHO VERTEBRAL LUMBAR AUGMENTATION COMPARISON:  None Available. MEDICATIONS: As antibiotic prophylaxis, 2 g Ancef  was ordered pre-procedure and administered intravenously within 1 hour of incision. ANESTHESIA/SEDATION: Moderate (conscious) sedation was employed during this procedure. A total of Versed  3.0 mg and Fentanyl  150 mcg, 1 mg IV Dilaudid  was administered intravenously. Moderate Sedation Time: 22 minutes. The patient's level of consciousness and vital signs were monitored continuously by radiology nursing throughout the procedure under my direct supervision. FLUOROSCOPY TIME:  Reference air kerma: 717 mGy COMPLICATIONS: None PROCEDURE: Following a full explanation of the procedure along with the potentially associated complications, a witnessed informed consent was obtained. Specific risks that were discussed included bleeding, infection, injury to adjacent structures, neurologic injury, embolization of cement within the veins, failure of the procedure to improve pain, need for further procedure/ surgery, cardiopulmonary collapse, death. The patient understands the risks and wishes to proceed. The patient was placed prone on the fluoroscopic table. Nasal oxygen was administered. Physiologic monitoring was performed throughout the duration of the procedure. The skin overlying the L2 region was prepped and draped in the usual sterile fashion. The vertebral  body was identified and the right pedicle was infiltrated with 1% lidocaine . This was then followed by the advancement of an 8-gauge Medtronic needle through the right pedicle into the mid to posterior 1/3. The left pedicle was then infiltrated with 1% lidocaine . A small incision was made with 11 blade scalpel, and then a Medtronic 8 gauge needle was advanced through the left pedicle into the posterior 1/3 of the vertebral body. Simultaneous inflation of the left and right pedicular cannula balloons was performed under fluoroscopic observation. Methylmethacrylate mixture was then reconstituted. Under biplane intermittent fluoroscopy, the methylmethacrylate was then injected into the vertebral body with filling of the fracture cleft, and excellent radiographic left-to-right filling. No extravasation was noted posteriorly into the spinal canal. Trace right-sided venous contamination was seen. The needles were then removed. Hemostasis was achieved at the skin entry site. There were no acute complications. Patient tolerated the procedure well. Patient was then observed in recovery before transfer. IMPRESSION: Status post image guided vertebral augmentation of L2 vertebral body, via bipedicular approach using kyphoplasty technique. Signed, Marciano Settles. Rexine Cater, RPVI [facility] Electronically Signed   By: Myrlene Asper D.O.   On: 07/08/2023 16:32    Scheduled Meds:  amiodarone   200 mg Oral Daily   apixaban   5 mg Oral BID   budesonide -glycopyrrolate -formoterol   2 puff Inhalation BID   feeding supplement  237 mL Oral TID BM   fentaNYL   1 patch Transdermal Q72H   gabapentin   100 mg Oral QHS   HYDROmorphone   2 mg Oral Q4H   lidocaine   1 patch Transdermal Q24H   metoprolol  succinate  25 mg Oral Daily   PARoxetine   10 mg Oral QHS   polyethylene glycol  17 g Oral Daily   predniSONE   5 mg Oral Q breakfast   senna-docusate  2 tablet Oral BID   sodium  chloride  1 g Oral BID WC   Vitamin D  (Ergocalciferol )  50,000 Units Oral Q7 days   Continuous Infusions:   LOS: 7 days   Time spent: 50 minutes  Unk Garb, DO  [facility]  07/09/2023, 12:45 PM

## 2023-07-09 NOTE — Progress Notes (Signed)
 Referring Provider(s): Dr. Gray Layman, MD  Supervising Physician: Myrlene Asper  Patient Status:  [facility] - In-pt  Chief Complaint: Back and leg pain in the setting of acute L2 compression fracture, status post kyphoplasty on 5/23 by Dr. Mabel Savage.  Subjective:  Patient alert and laying in bed, calm.  Currently without any significant complaints. He is feeling much improved after kyphoplasty Denies any fevers, headache, chest pain, SOB, cough, abdominal pain, nausea, vomiting or bleeding.     Allergies: Ventolin  [albuterol ], Lipitor [atorvastatin ], Oxycodone , and Ketamine   Medications: Prior to Admission medications   Medication Sig Start Date End Date Taking? Authorizing Provider  acetaminophen  (TYLENOL ) 500 MG tablet Take 500 mg by mouth daily as needed for moderate pain (pain score 4-6), fever or headache.   Yes [provider]  amiodarone  (PACERONE ) 200 MG tablet START by taking 2 tablets (400mg ) daily for 2 weeks THEN reduce to 1 tablet (200mg ) daily Patient taking differently: Take 200 mg by mouth daily. 04/02/23  Yes Debbie Fails, PA-C  benzonatate  (TESSALON ) 200 MG capsule Take 1 capsule (200 mg total) by mouth 3 (three) times daily as needed. 06/02/23 06/01/24 Yes Parrett, Tammy S, NP  ELIQUIS  5 MG TABS tablet TAKE 1 TABLET(5 MG) BY MOUTH TWICE DAILY Patient taking differently: Take 5 mg by mouth 2 (two) times daily. 05/12/23  Yes Turner, Rufus Council, MD  Evolocumab  (REPATHA  SURECLICK) 140 MG/ML SOAJ ADMINISTER 1 ML UNDER THE SKIN EVERY 14 DAYS 05/16/23  Yes Turner, Rufus Council, MD  HYDROcodone  bit-homatropine (HYDROMET) 5-1.5 MG/5ML syrup Take 5 mLs by mouth every 6 (six) hours as needed for cough. 06/28/23  Yes Parrett, Tammy S, NP  lidocaine  (LIDODERM ) 5 % Place 1 patch onto the skin daily. Remove & Discard patch within 12 hours or as directed by MD 04/29/23  Yes Ennever, Sherryll Zayd, MD  metoprolol  succinate (TOPROL -XL) 25 MG 24 hr tablet TAKE 1 TABLET(25 MG) BY MOUTH DAILY  05/18/23  Yes Turner, Rufus Council, MD  ondansetron  (ZOFRAN ) 8 MG tablet Take 1 tablet (8 mg total) by mouth every 8 (eight) hours as needed for nausea or vomiting. 05/26/23  Yes Ivor Mars, MD  oxyCODONE -acetaminophen  (PERCOCET/ROXICET) 5-325 MG tablet Take 1-2 tablets by mouth every 6 (six) hours as needed. 07/02/23  Yes Ivor Mars, MD  PARoxetine  (PAXIL ) 10 MG tablet Take 1 tablet (10 mg total) by mouth at bedtime. 01/11/23  Yes Dorthy Gavia, MD  polyethylene glycol (MIRALAX  / GLYCOLAX ) 17 g packet Take 17 g by mouth daily as needed for mild constipation. 06/09/23  Yes Lonita Roach, MD  predniSONE  (DELTASONE ) 5 MG tablet Take 1 tablet (5 mg total) by mouth daily with breakfast. 08/18/22  Yes Ennever, Sherryll Reeve, MD  prochlorperazine  (COMPAZINE ) 10 MG tablet Take 1 tablet (10 mg total) by mouth every 6 (six) hours as needed for nausea or vomiting. 05/26/23  Yes Ivor Mars, MD  TRELEGY ELLIPTA  100-62.5-25 MCG/ACT AEPB INHALE 1 PUFF INTO THE LUNGS DAILY 06/20/23  Yes Denson Flake, MD     Vital Signs: BP 114/83   Pulse 90   Temp 97.8 F (36.6 C) (Oral)   Resp 10   Ht 5\' 11"  (1.803 m)   Wt 234 lb (106.1 kg)   SpO2 96%   BMI 32.64 kg/m   Physical Exam Constitutional:      Appearance: Normal appearance.  Cardiovascular:     Rate and Rhythm: Normal rate.  Pulmonary:     Effort: Pulmonary  effort is normal.  Musculoskeletal:     Comments: Notable resolution of prior point tenderness with radiation to hips and legs bilaterally. Patient has not yet ambulated since kyphoplasty.  No distal paresthesias, weakness. Distal pulses normal.  Skin:    General: Skin is warm and dry.  Neurological:     Mental Status: He is alert and oriented to person, place, and time.      Labs:  CBC: Recent Labs    07/05/23 0428 07/06/23 0257 07/07/23 0359 07/08/23 0330  WBC 1.0* 1.3* 2.1* 3.0*  HGB 11.2* 10.1* 10.5* 10.5*  HCT 33.4* 30.8* 32.0* 32.0*  PLT 205 211 234 267     COAGS: Recent Labs    04/29/23 0511 06/06/23 1002 07/04/23 1318 07/05/23 1815 07/06/23 0257 07/07/23 0359  INR 1.2 1.6* 1.4*  --   --  1.1  APTT  --   --   --  76* 94* 72*    BMP: Recent Labs    07/05/23 0428 07/06/23 0257 07/07/23 0359 07/09/23 0409  NA 131* 128* 130* 131*  K 3.7 3.4* 4.2 4.0  CL 103 103 101 97*  CO2 20* 19* 22 26  GLUCOSE 107* 125* 107* 113*  BUN 17 17 14 17   CALCIUM  8.1* 7.9* 8.2* 8.3*  CREATININE 0.90 0.76 0.78 0.91  GFRNONAA >60 >60 >60 >60    LIVER FUNCTION TESTS: Recent Labs    06/23/23 2015 07/01/23 0814 07/02/23 1423 07/03/23 0500  BILITOT 1.1 1.1 1.6* 1.3*  AST 23 42* 28 29  ALT 18 55* 50* 48*  ALKPHOS 83 84 71 67  PROT 5.6* 6.5 5.8* 5.5*  ALBUMIN  3.0* 4.3 3.3* 3.3*    Assessment and Plan: Patient is greatly improved s/p kyphoplasty. No sequela. He is relieved. He has not ambulated since procedure. PT pending. Plan per care team. IR will continue to follow.   Thank you for this interesting consult.  I greatly enjoyed meeting John [name] and look forward to participating in their care.   Electronically Signed: Lovena Rubinstein, PA-C 07/09/2023, 3:25 PM     I spent a total of 15 Minutes at the the patient's bedside AND on the patient's hospital floor or unit, greater than 50% of which was counseling/coordinating care for back pain secondary to L2 acute compression fracture, s/p kyphoplasty.

## 2023-07-09 NOTE — Progress Notes (Signed)
 Daily Progress Note   Patient Name: John [name]       Date: 07/09/2023 DOB: 07-26-1960  Age: 63 y.o. MRN#: [id number] Attending Physician: John Garb, DO Primary Care Physician: John [name] Admit Date: 07/02/2023 Length of Stay: 7 days  Reason for Consultation/Follow-up: Establishing goals of care and Pain control  Subjective:   CC: Patient noting pain improved after kyphoplasty on 07/08/2023.  Following up regarding complex medical decision making and symptom management.  Subjective:  At time of EMR review in past 24 hours patient has required as needed IV Dilaudid  1 mg x 2 doses and as needed p.o. Dilaudid  2 mg x 1 dose.  Patient's fentanyl  patch was increased to 50 mcg every 72 hours on 07/08/2023.  Patient has also continued to receive p.o. Dilaudid  2 mg every 4 hours scheduled. Reviewed recent BMP noting BUN 17, creatinine 0.91, with GFR >60.  Continuing to monitor with opioid medication use. Reviewed recent hospitalist and IR note.  Presented to bedside to see patient.  Patient laying comfortably in bed.  RN present at bedside.  Able to follow-up with patient regarding symptom management at this time.  Patient notes his pain is greatly improved after kyphoplasty yesterday.  Patient still has some pain though feels this is more from surgical intervention.  Acknowledged this.  Discussed plan for pain management today.  Will continue fentanyl  50 mcg every 72 hour patch.  Discussed discontinuing scheduled p.o. Dilaudid  2 mg every 4 hour.  Patient supporting this plan.  Noted would still make as needed p.o. Dilaudid  and IV Dilaudid  available if needed.  Can adjust doses further on p.o. as needed Dilaudid  if required.  Patient agrees with this plan.  Patient looking forward to participating with physical therapy later today.  Encourage patient receive pain medicines as needed ahead of time to participate with them.  All questions answered at that time.  Noted palliative medicine team  to continue following with patient's medical journey.  Objective:   Vital Signs:  BP 114/79 (BP Location: Right Arm)   Pulse 96   Temp 98.2 F (36.8 C) (Oral)   Resp 16   Ht 5\' 11"  (1.803 m)   Wt 106.1 kg   SpO2 92%   BMI 32.64 kg/m   Physical Exam: General: NAD, alert, laying in bed Cardiovascular: RRR Respiratory: no increased work of breathing noted, not in respiratory distress Neuro: A&Ox4, following commands easily Psych: Pleasant   Assessment & Plan:   Assessment: John [name] is a 63 y.o. 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  Palliative medicine team consulted to assist with complex medical decision making and symptom management.  Recommendations/Plan: # Complex medical decision making/goals of care:  - Patient continues discussions with providers regarding care planning.  Patient underwent kyphoplasty appropriately on 07/08/2023.  Patient looking forward to participating with physical therapy to regain strength.  Patient planning to follow-up with oncology in the outpatient setting.  Palliative medicine team continuing to follow along and assist with conversations as able and appropriate.  -  Code Status: Full Code  # Symptom management: Patient is receiving these palliative interventions for symptom management with an intent to  improve quality of life.   - Pain, severe in the setting of metastatic squamous cell carcinoma of the lung now with back pain requiring kyphoplasty Patient's pain has improved with kyphoplasty on 07/08/2023.   - Continue fentanyl  patch to 50 mcg every 72  hour   - Discontinue oral Dilaudid  scheduled    - Continue oral Dilaudid  2 mg every 4 hours as needed   - Continue IV Dilaudid  1 mg every 4 hours as needed severe breakthrough pain   - Continue Lidoderm  patch   - Continue gabapentin  100 mg nightly   - Constipation   - Continue senna 2 tabs twice daily   - Continue MiraLAX  17 g daily   - Continue bisacodyl  suppository daily as needed  # Psychosocial Support:  - Wife  # Discharge Planning: To Be Determined  - Placed outpatient palliative medicine referral on 07/08/2023 for follow-up at [facility].  Discussed with: Patient, RN  Thank you for allowing the palliative care team to participate in the care John [name].  John Libel, DO Palliative Care Provider PMT # [phone]  If patient remains symptomatic despite maximum doses, please call PMT at [phone] between 0700 and 1900. Outside of these hours, please call attending, as PMT does not have night coverage.

## 2023-07-10 DIAGNOSIS — Z79899 Other long term (current) drug therapy: Secondary | ICD-10-CM | POA: Diagnosis not present

## 2023-07-10 DIAGNOSIS — Z515 Encounter for palliative care: Secondary | ICD-10-CM | POA: Diagnosis not present

## 2023-07-10 DIAGNOSIS — Z9889 Other specified postprocedural states: Secondary | ICD-10-CM | POA: Diagnosis not present

## 2023-07-10 DIAGNOSIS — J9621 Acute and chronic respiratory failure with hypoxia: Secondary | ICD-10-CM | POA: Diagnosis not present

## 2023-07-10 DIAGNOSIS — G893 Neoplasm related pain (acute) (chronic): Secondary | ICD-10-CM | POA: Diagnosis not present

## 2023-07-10 DIAGNOSIS — M549 Dorsalgia, unspecified: Secondary | ICD-10-CM | POA: Diagnosis not present

## 2023-07-10 LAB — CBC
HCT: 34.1 % — ABNORMAL LOW (ref 39.0–52.0)
Hemoglobin: 10.9 g/dL — ABNORMAL LOW (ref 13.0–17.0)
MCH: 30.7 pg (ref 26.0–34.0)
MCHC: 32 g/dL (ref 30.0–36.0)
MCV: 96.1 fL (ref 80.0–100.0)
Platelets: 287 10*3/uL (ref 150–400)
RBC: 3.55 MIL/uL — ABNORMAL LOW (ref 4.22–5.81)
RDW: 18.8 % — ABNORMAL HIGH (ref 11.5–15.5)
WBC: 4.1 10*3/uL (ref 4.0–10.5)
nRBC: 0 % (ref 0.0–0.2)

## 2023-07-10 MED ORDER — SENNOSIDES-DOCUSATE SODIUM 8.6-50 MG PO TABS
3.0000 | ORAL_TABLET | Freq: Two times a day (BID) | ORAL | Status: DC
  Filled 2023-07-10 (×2): qty 3

## 2023-07-10 NOTE — Progress Notes (Signed)
 Patient c/o "muscle spasms" in his lower legs/back, jerking movements randomly, worse in left leg. States this is not his baseline and he is concerned it is related to the new Gabapentin . Requested MD to D/C Gabapentin . MD Farrel Hones made aware- Gabapentin  D/C'd.

## 2023-07-10 NOTE — Progress Notes (Signed)
 Daily Progress Note   Patient Name: Nimesh Samarin       Date: 07/10/2023 DOB: 06/06/1960  Age: 63 y.o. MRN#: [id number] Attending Physician: Unk Garb, DO Primary Care Physician: Patient, No Pcp Per Admit Date: 07/02/2023 Length of Stay: 8 days  Reason for Consultation/Follow-up: Establishing goals of care and Pain control  Subjective:   CC: Patient noting pain improved after kyphoplasty on 07/08/2023.  Following up regarding complex medical decision making and symptom management.  Subjective:  I discussed care with patient and RN during day on 07/09/2023.  After discussion, decrease patient's fentanyl  patch to 37 mcg every 72 hour on 07/09/2023. At time of EMR review today, patient has not required any as needed p.o. or IV Dilaudid .  Presented to bedside to see patient.  No visitors present at bedside.  Patient initially sleeping comfortably though easily awakened.  Patient notes that his pain is currently well-controlled with the fentanyl  patch.  Patient noted he did have spasm which he got muscle relaxer for this morning.  Noted this is likely associated with recent surgical intervention and hopefully will improve.  Patient feels current opioid doses are working well and could continue current regimen.  Acknowledged and noted palliative medicine team continue to follow with patient's medical journey.  Patient voiced appreciation for visit.  Objective:   Vital Signs:  BP 113/71 (BP Location: Right Arm)   Pulse 89   Temp 97.8 F (36.6 C) (Oral)   Resp 18   Ht 5\' 11"  (1.803 m)   Wt 106.1 kg   SpO2 98%   BMI 32.64 kg/m   Physical Exam: General: NAD, alert, laying in bed Cardiovascular: RRR Respiratory: no increased work of breathing noted, not in respiratory distress Neuro: A&Ox4, following commands easily Psych: Pleasant   Assessment & Plan:   Assessment: Khalib Fendley is a 63 y.o. 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  Palliative medicine team consulted to assist with complex medical decision making and symptom management.  Recommendations/Plan: # Complex medical decision making/goals of care:  - Patient continues discussions with providers regarding care planning.  Patient underwent kyphoplasty appropriately on 07/08/2023.  Patient looking forward to participating with physical therapy to regain strength.  Patient planning to follow-up with oncology in the outpatient setting.  Palliative medicine team continuing to follow along and assist with conversations as able and appropriate.  -  Code Status: Full Code  # Symptom management: Patient is receiving these palliative interventions for symptom management with an intent to improve quality of life.   - Pain, severe in the setting of metastatic squamous cell carcinoma of the lung now with back pain requiring kyphoplasty Patient's pain has improved with kyphoplasty on 07/08/2023.   - Continue fentanyl  patch at 37 mcg every 72 hour   - Continue oral Dilaudid  2 mg every 4 hours as needed   - Continue IV Dilaudid  1 mg every 4 hours as needed severe breakthrough pain   - Continue Lidoderm  patch   - Continue gabapentin  100 mg nightly   - Constipation Patient's last bowel movement noted to be 07/07/2023.   - Increase senna to 3 tabs twice daily. Can decrease if  needed based on symptom response.    - Continue MiraLAX  17 g daily   - Continue bisacodyl  suppository daily as needed  # Psychosocial Support:  - Wife  # Discharge Planning: To Be Determined  - Placed outpatient palliative medicine referral on 07/08/2023 for follow-up at  [facility].  Discussed with: Patient, RN  Thank you for allowing the palliative care team to participate in the care Hafiz Wester.  Barnett Libel, DO Palliative Care Provider PMT # [phone]  If patient remains symptomatic despite maximum doses, please call PMT at [phone] between 0700 and 1900. Outside of these hours, please call attending, as PMT does not have night coverage.

## 2023-07-10 NOTE — Progress Notes (Signed)
 Physical Therapy Treatment Patient Details Name: John [name] MRN: [id number] DOB: [birth year] Today's Date: 07/10/2023   History of Present Illness Pt is 63 yo male who presented to the ED for evaluation of worsening back pain and shortness of breath. pt is s/p KP L2 on  5/23. 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    PT Comments  Pt making steady  progress with PT, very motivated. Pt able to perform repeated STS this session with improved LLE control, stedy use for bed to chair transfer. Pt and wife are motivated to d/c home if possible.  Wife states if pt is able to transfer they feel they can manage at home with f/u HHPT; will work towards this goal with pt and family. Pt has DME, may need non-emergent transport home depending on progress.    If plan is discharge home, recommend the following: A lot of help with walking and/or transfers;Two people to help with walking and/or transfers;Assistance with cooking/housework;Help with stairs or ramp for entrance;Assist for transportation   Can travel by private vehicle        Equipment Recommendations  None recommended by PT    Recommendations for Other Services       Precautions / Restrictions Precautions Precautions: Fall;Back Recall of Precautions/Restrictions: Impaired Precaution/Restrictions Comments: pt able to demonstrate log roll without cues this session     Mobility  Bed Mobility Overal bed mobility: Needs Assistance Bed Mobility: Rolling, Sidelying to Sit Rolling: Supervision, Used rails Sidelying to sit: Min assist       General bed mobility comments: cues for technique, self assist    Transfers Overall transfer level: Needs  assistance Equipment used: Rolling walker (2 wheels) Transfers: Sit to/from Stand, Bed to chair/wheelchair/BSC Sit to Stand: Min assist           General transfer comment: STS x5; assist to rise and control descent. able to maintain standing ~ 45 sec to 2 minutes, incr time with each trial; min-squats working on L knee control while in stedy Transfer via Lift Equipment: Stedy  Ambulation/Gait               General Gait Details: NT   Stairs             Wheelchair Mobility     Tilt Bed    Modified Rankin (Stroke Patients Only)       Balance   Sitting-balance support: Single extremity supported, Feet supported, No upper extremity supported Sitting balance-Leahy Scale: Fair                                      Hotel manager: No apparent difficulties  Cognition Arousal: Alert Behavior During Therapy: WFL for tasks assessed/performed, Impulsive   PT - Cognitive impairments: Safety/Judgement                         Following commands: Intact      Cueing Cueing Techniques: Verbal cues, Tactile cues  Exercises General Exercises - Lower Extremity Quad Sets: AROM, Left, 5 reps Long Arc Quad: AROM, Left Heel Slides: AROM, Left, 5 reps (improved motor control)    General Comments  Pertinent Vitals/Pain Pain Assessment Pain Assessment: Faces Faces Pain Scale: Hurts a little bit Pain Location: back Pain Descriptors / Indicators: Grimacing, Guarding, Discomfort Pain Intervention(s): Limited activity within patient's tolerance, Monitored during session, Repositioned    Home Living                          Prior Function            PT Goals (current goals can now be found in the care plan section) Acute Rehab PT Goals Patient Stated Goal: to get stronger PT Goal Formulation: With patient/family Time For Goal Achievement: 07/22/23 Potential to Achieve Goals: Good Progress  towards PT goals: Progressing toward goals    Frequency    Min 3X/week      PT Plan      Co-evaluation              AM-PAC PT "6 Clicks" Mobility   Outcome Measure  Help needed turning from your back to your side while in a flat bed without using bedrails?: A Little Help needed moving from lying on your back to sitting on the side of a flat bed without using bedrails?: A Little Help needed moving to and from a bed to a chair (including a wheelchair)?: Total Help needed standing up from a chair using your arms (e.g., wheelchair or bedside chair)?: A Lot Help needed to walk in hospital room?: Total Help needed climbing 3-5 steps with a railing? : Total 6 Click Score: 11    End of Session Equipment Utilized During Treatment: Gait belt Activity Tolerance: Patient tolerated treatment well;Patient limited by fatigue Patient left: in chair;with call bell/phone within reach;with chair alarm set;with family/visitor present Nurse Communication: Mobility status;Need for lift equipment (stedy) PT Visit Diagnosis: Other abnormalities of gait and mobility (R26.89);History of falling (Z91.81);Muscle weakness (generalized) (M62.81)     Time: 7829-5621 PT Time Calculation (min) (ACUTE ONLY): 21 min  Charges:    $Therapeutic Activity: 8-22 mins PT General Charges $$ ACUTE PT VISIT: 1 Visit                     Janay Canan, PT  Acute Rehab Dept [facility]) [phone]  07/10/2023    [facility] 07/10/2023, 3:45 PM

## 2023-07-10 NOTE — Progress Notes (Signed)
 Occupational Therapy Treatment Patient Details Name: John [name] MRN: [id number] DOB: 1961-01-03 Today's Date: 07/10/2023   History of present illness Pt is 63 yo male who presented to the ED for evaluation of worsening back pain and shortness of breath. pt is s/p KP L2 on  5/23. 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   OT comments  Patient was noted to have less pain and be able to engage in session better since kyphoplasty. Patient was able to engage in sitting EOB to engage in grooming tasks with CGA to maintain balance. Patient was noted to have decreased functional activity tolerance, decreased endurance, decreased standing balance, decreased safety awareness, and decreased knowledge of AD/AE impacting participation in ADLs. Patient will benefit from continued inpatient follow up therapy, <3 hours/day.       If plan is discharge home, recommend the following:  A little help with bathing/dressing/bathroom;A little help with walking and/or transfers;Help with stairs or ramp for entrance;A lot of help with bathing/dressing/bathroom;A lot of help with walking and/or transfers   Equipment Recommendations  None recommended by OT       Precautions / Restrictions Precautions Precautions: Fall;Back Recall of Precautions/Restrictions: Impaired Precaution/Restrictions Comments: requires cues to maintain back precautions Restrictions Weight Bearing Restrictions Per Provider Order: No       Mobility Bed Mobility Overal bed mobility: Needs Assistance Bed Mobility: Rolling, Sidelying to Sit, Sit to Sidelying   Sidelying to sit: Min assist     Sit to sidelying: Min assist General bed mobility comments: cues to  maintain log rolling       Balance Overall balance assessment: Needs assistance Sitting-balance support: Bilateral upper extremity supported, Feet supported Sitting balance-Leahy Scale: Fair           ADL either performed or assessed with clinical judgement   ADL Overall ADL's : Needs assistance/impaired     Grooming: Sitting;Wash/dry face;Oral care Grooming Details (indicate cue type and reason): sitting EOB with increased time. after completion of task patient reported that he had dizziness. BP was checked with 123/90 mmhg.                               General ADL Comments: patient was able to scoot two scoots up to [facility] while sitting EOB with CGA      Cognition Arousal: Alert Behavior During Therapy: WFL for tasks assessed/performed, Impulsive Cognition: No apparent impairments                          Pertinent Vitals/ Pain       Pain Assessment Pain Assessment: Faces Faces Pain Scale: Hurts little more Pain Location: back Pain Descriptors / Indicators: Grimacing, Guarding, Discomfort Pain Intervention(s): Limited activity within patient's tolerance, Monitored during session         Frequency  Min 2X/week        Progress Toward Goals  OT Goals(current goals can now be found in the care plan section)  Progress towards OT goals: Progressing toward goals     Plan         AM-PAC OT "6 Clicks" Daily Activity     Outcome Measure   Help from another person eating  meals?: None Help from another person taking care of personal grooming?: A Little Help from another person toileting, which includes using toliet, bedpan, or urinal?: A Lot Help from another person bathing (including washing, rinsing, drying)?: A Lot Help from another person to put on and taking off regular upper body clothing?: A Little Help from another person to put on and taking off regular lower body clothing?: A Lot 6 Click Score: 16    End of Session Equipment  Utilized During Treatment: Oxygen  OT Visit Diagnosis: Unsteadiness on feet (R26.81);Muscle weakness (generalized) (M62.81);Pain   Activity Tolerance Patient limited by pain   Patient Left in bed;with call bell/phone within reach   Nurse Communication Other (comment) (patient ok to see during session)        Time: 1007-1019 OT Time Calculation (min): 12 min  Charges: OT General Charges $OT Visit: 1 Visit OT Treatments $Self Care/Home Management : 8-22 mins  John Heckler, MS Acute Rehabilitation Department Office# [phone]   John [name] 07/10/2023, 12:38 PM

## 2023-07-10 NOTE — Progress Notes (Signed)
 PROGRESS NOTE    John [name]  ZOX:[id number] DOB: 1960/10/01 DOA: 07/02/2023 PCP: Patient, No Pcp Per  Subjective: Pt seen and examined. Doing well with his back pain. Only able to stand up with PT yesterday. Pt motivated to work with PT.   Hospital Course: HPI: John [name] is a 63 y.o. 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  Patient reports she has had back pain for months but the pain was too severe today to tolerate.  His home Percocet is not controlling his pain.  He describes the pain as sharp in his low back, radiating to both legs. He also reports difficulty breathing today.  On EMS arrival, patient was found to have SpO2 of 88% which improved with 6 L [place]. He reports difficulty walking due to the pain, poor appetite and feeling thirsty but denies any fevers, chills, cough, nausea, vomiting, abdominal pain, headache or dizziness.   ED Course: Initial vitals show temp 98.1, RR 19, HR 90, BP 161/101 and SpO2 95% on 6 L [place].  Labs significant for sodium 124, K+ 3.3, creatinine 0.87, WBC 1.3 (from 4.3 1-day ago), Hgb 11.3, platelet 175, mag 1.8, Phos 2.4. CXR shows right basilar consolidation concerning for acute airway disease versus atelectasis. CTA PE study negative for PE but shows signs of possible multifocal pneumonia. Patient received IV morphine  1 mg x 2, IV ketamine  32 mg x 1, Toradol  15 mg x 1, IV NS 500 cc x 1, IV cefepime  and IV vancomycin . TRH was consulted for admission.  Significant Events: Admitted 07/02/2023 acute on chronic respiratory failure with hypoxia   Significant Labs: Covid/flu/RSV  negative RVP negative Na 124, K 3.5, CO2 of 19, BUN 14, Scr 0.78, glu 105 INR 1.4 WBC 1.0, HgB 11.2, plt 205 Legionella urinary antigen Negative Strep Pneumo antigen Negative  Significant Imaging Studies: CXR Increasing right basilar consolidation superimposed upon chronic post radiation fibrosis, which may reflect acute airspace disease or atelectasis. CTPA No evidence for pulmonary embolism. 2. Stable multifocal areas of ground-glass opacity throughout both lungs along with some peripheral reticular opacities. Findings are favored as infectious/inflammatory. 3. Stable focal airspace opacity in the right perihilar region and superior segment of the right lower lobe with some central cavitation. Findings may be  infectious/inflammatory. 4. Stable 3.3 cm infrarenal abdominal aortic aneurysm. Recommend follow-up ultrasound every 3 years. Aortic Atherosclerosis (ICD10-I70.0) and Emphysema (ICD10-J43.9)  CT T-spine Chronic compression deformities from T10 through L2, with evidence of prior vertebral augmentation at T12 and L1. No acute thoracic spine fracture. Abd XR No significant formed stool in the colon. 2. Air scattered throughout nondilated small bowel, query ileus Pelvic XR Mild bilateral femoroacetabular osteoarthritis  MRI T-spine No evidence for metastatic disease within the thoracic spine. 2. Evolving subacute compression fracture involving the superior endplate of T11 with no more than mild residual marrow edema. Height loss is not significantly changed or progressed as compared to prior MRI from 06/06/2023. 3. Chronic compression deformities involving the T12 and L1 vertebral bodies with sequelae of prior vertebral augmentation, stable. 4. Small disc protrusions at T6-7 and T7-8 without significant stenosis. Moderate right with mild left foraminal stenosis at T10-11. 5. Post  treatment changes within the visualized right lung with known right adrenal metastasis, described on recent CTs. MRI  L-spine Acute compression fracture involving the inferior endplate of L2 with up to 60% height loss without significant bony retropulsion. This is superimposed on an underlying chronic L2 compression fracture, and is benign/mechanical in appearance. No evidence for metastatic disease within the lumbar spine. 2. Additional chronic compression deformities involving the T12, L1, L3, L4, and L5 vertebral bodies, otherwise stable. 3. Multifactorial degenerative changes at L4-5 with resultant moderate canal and left greater than right lateral recess stenosis, with mild to moderate bilateral L4 foraminal narrowing. 4. Left foraminal to extraforaminal disc protrusion at L3-4, potentially affecting the exiting left L3 nerve root. 5. 3.7 cm intra-abdominal aortic aneurysm. Recommend follow-up every 2 years.   Antibiotic Therapy: Anti-infectives (From admission, onward)    Start     Dose/Rate Route Frequency Ordered Stop   07/07/23 0600  ceFAZolin  (ANCEF ) IVPB 2g/100 mL premix       Note to Pharmacy: Patient is at [facility] and will transfer round-[facility] to [facility] for procedure.   2 g 200 mL/hr over 30 Minutes Intravenous To Radiology 07/05/23 1734 07/08/23 0600   07/03/23 0800  vancomycin  (VANCOCIN ) IVPB 1000 mg/200 mL premix  Status:  Discontinued        1,000 mg 200 mL/hr over 60 Minutes Intravenous Every 12 hours 07/02/23 2146 07/03/23 0722   07/03/23 0200  ceFEPIme  (MAXIPIME ) 2 g in sodium chloride  0.9 % 100 mL IVPB        2 g 200 mL/hr over 30 Minutes Intravenous Every 8 hours 07/02/23 2141     07/02/23 1845  vancomycin  (VANCOREADY) IVPB 2000 mg/400 mL        2,000 mg 200 mL/hr over 120 Minutes Intravenous  Once 07/02/23 1813 07/02/23 2120   07/02/23 1815  vancomycin  (VANCOCIN ) IVPB 1000 mg/200 mL premix  Status:  Discontinued        1,000 mg 200 mL/hr over 60 Minutes Intravenous  Once 07/02/23 1807 07/02/23 1813   07/02/23 1815  ceFEPIme  (MAXIPIME ) 2 g in sodium chloride  0.9 % 100 mL IVPB        2 g 200  mL/hr over 30 Minutes Intravenous  Once 07/02/23 1807 07/02/23 1911       Procedures: 07-08-2023 lumbar L2 kyphoplasty  Consultants: Palliative care Heme/onc IR    Assessment and Plan: * Acute on chronic respiratory failure with hypoxia (HCC) 07-02-2023 through 07-05-2023 Uses 3 L nasal cannula O2 at baseline.  He desaturated to 83% and required up to 15 L high flow nasal cannula.  Currently on 4 L nasal cannula O2 - MRSA PCR negative - Strep pneumo antigen negative - Legionella antigen pending - COVID, RSV, influenza, respiratory viral panel negative  - Empiric cefepime   07-06-2023 no evidence of pneumonia. Pt was on empiric cefepime  due to neutropenia. No fevers. Discussed with oncology. Will stop IV Cefepime .  07-07-2023 stable on 3 L/min. Abx stopped on 07-06-2023.  07-08-2023 stable. On 3 L/min.  07-09-2023 stable on 3 L/min.  07-10-2023 stable.  Pain of metastatic malignancy 07-06-2023 continue with IV dilaudid  prn. Has fentanyl  patch.  07-07-2023 continue with IV dilaudid  prn. Continue with fentanyl  patch.  07-08-2023 continue with IV dilaudid  prn. On fentanyl  patch scheduled and prn oral dilaudid .  07-09-2023 continue with IV dilaudid  prn. On fentanyl  patch scheduled and prn oral dilaudid .  07-10-2023 continue with IV dilaudid  prn. On fentanyl  patch scheduled and prn oral dilaudid .  Intractable back pain  07-02-2023 through 07-06-2023 due to vertebral compression fractures.  Appreciate palliative care medicine for symptom management.  Started fentanyl  patch, Dilaudid , Lidoderm  patch, Robaxin , gabapentin  - CT thoracic spine revealed chronic compression deformities T10-L2.  He did have kyphoplasty of T12 back in July 2023, and kyphoplasty L1 and L3 in August 2023 - MRI thoracic and lumbar spine revealed L2 compression fracture - IR consulted for kyphoplasty  07-06-2023 Was on schedule for have kyphoplasty tomorrow. Just notified by IR that pt's insurance company  wants more information regarding his back pain. Continue with IV dilaudid  prn.  07-07-2023 still awaiting on insurance authorization. Continue with prn IV dilaudid  for pain control.  PT assessment cannot being until after his kyphoplasty.  07-08-2023 Pt and wife are expectedly upset that insurance authorization has not been obtained. I explained to both of them that providers have no control on what insurance companies authorize or reject. Pt states he called insurance company and he states insurance company claims they don't have any requests for kyphoplasty authorization. I have reached out to IR APP to make sure that request for authorization has been sent by radiology group to pt's insurance company.  Continue IV Dilaudid  prn.   07-09-2023 had L2 kyphoplasty yesterday. Continue prn IV dilaudid . Start PT.  07-10-2023 continue IV dilaudid . On po dilaudid  prn and scheduled fentanyl  patch. Continue with PT efforts. May need SNF vs home health vs CIR. Will see how he does this holiday weekend(memorial day).  S/P kyphoplasty - L2 on 07-08-2023 07-09-2023 had L2 kyphoplasty yesterday. Continue prn IV dilaudid . Start PT.  Obesity, Class I, BMI 30-34.9 Body mass index is 32.64 kg/m.   PAF (paroxysmal atrial fibrillation) (HCC) 07-06-2023 continue with IV heparin  gtts pending his kyphoplasty. On Toprol  XL  07-07-2023 stable. Remains on IV heparin  gtts.  07-08-2023 continue IV heparin  gtts. Awaiting pt to complete kyphoplasty prior to restarting po Eliquis   07-09-2023 eliquis  restarted last night. Stable.  07-10-2023 stable on Eliquis .  SIADH (syndrome of inappropriate ADH production) (HCC) 07-06-2023 Na stable. Na 128. - Urine osmole 575  - Urine sodium 129 - Salt tabs and fluid restriction - Improved  07-07-2023 Na 130 today.   07-09-2023 stable. Na 131 today.  Hyponatremia 07-06-2023 Na stable. Na 128. - Urine osmole 575  - Urine sodium 129 - Salt tabs and fluid restriction -  Improved  07-07-2023 Na 130 today.  07-08-2023 stable.  07-09-2023 stable. Na 131 today.  Stage 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 (HCC) 07-06-2023 - Currently under active therapy with  Gemzar  and Navelbine  - Followed by Dr. Maria Shiner  07-07-2023 stable.  07-08-2023 oncology following.  07-09-2023 followed by oncology  07-10-2023 followed by oncology   DVT prophylaxis:  apixaban  (ELIQUIS ) tablet 5 mg     Code Status: Full Code Family Communication: no family at bedside. Pt is decisional. Disposition Plan: unknown Reason for continuing need for hospitalization: working with PT. Will need assessment of where he needs to go at discharge.  Objective: Vitals:   07/09/23 1955 07/09/23 2101 07/10/23 0436 07/10/23 0911  BP: 96/66  113/71 100/76  Pulse: 94  89 94  Resp: 18  18   Temp: (!) 97.5 F (36.4 C)  97.8 F (36.6 C)   TempSrc: Oral  Oral   SpO2: 95% 96% 98% 93%  Weight:      Height:        Intake/Output Summary (Last 24 hours) at 07/10/2023 0936 Last data filed at 07/10/2023 0900 Gross per 24 hour  Intake 237 ml  Output 1300 ml  Net -1063 ml   Filed Weights   07/03/23 0155  Weight: 106.1 kg    Examination:  Physical Exam Vitals and nursing note reviewed.  Constitutional:      Appearance: He is obese.  HENT:     Head: Normocephalic and atraumatic.  Eyes:     General: No scleral icterus. Cardiovascular:     Rate and Rhythm: Normal rate and regular rhythm.  Pulmonary:     Effort: Pulmonary effort is normal.     Breath sounds: Normal breath sounds.  Abdominal:     General: Bowel sounds are normal. There is no distension.     Palpations: Abdomen is soft.  Skin:    General: Skin is warm and dry.     Capillary Refill: Capillary refill takes less than 2 seconds.     Findings: Bruising present.     Comments: Left AC fossa. From prior PIV site where IV  heparin  was infusing.  Neurological:     Mental Status: He is alert and oriented to person,  place, and time.    Data Reviewed: I have personally reviewed following labs and imaging studies  CBC: Recent Labs  Lab 07/05/23 0428 07/06/23 0257 07/07/23 0359 07/08/23 0330 07/10/23 0357  WBC 1.0* 1.3* 2.1* 3.0* 4.1  NEUTROABS  --  0.2* 0.6*  --   --   HGB 11.2* 10.1* 10.5* 10.5* 10.9*  HCT 33.4* 30.8* 32.0* 32.0* 34.1*  MCV 92.5 91.9 92.8 93.3 96.1  PLT 205 211 234 267 287   Basic Metabolic Panel: Recent Labs  Lab 07/04/23 0331 07/05/23 0428 07/06/23 0257 07/07/23 0359 07/09/23 0409  NA 124* 131* 128* 130* 131*  K 3.5 3.7 3.4* 4.2 4.0  CL 95* 103 103 101 97*  CO2 19* 20* 19* 22 26  GLUCOSE 105* 107* 125* 107* 113*  BUN 14 17 17 14 17   CREATININE 0.78 0.90 0.76 0.78 0.91  CALCIUM  8.1* 8.1* 7.9* 8.2* 8.3*   GFR: Estimated Creatinine Clearance: 105.6 mL/min (by C-G formula based on SCr of 0.91 mg/dL).  Recent Results (from the past 240 hours)  Culture, blood (Routine X 2) w Reflex to ID Panel     Status: None   Collection Time: 07/02/23 10:15 PM   Specimen: BLOOD RIGHT ARM  Result Value Ref Range Status   Specimen Description   Final    BLOOD RIGHT ARM Performed at [facility], [address] [place], [place] [address]    Special Requests   Final    BOTTLES DRAWN AEROBIC AND ANAEROBIC Blood Culture results may not be optimal due to an inadequate volume of blood received in culture bottles Performed at [facility], [address], [place], [place] [address]    Culture   Final    NO GROWTH 5 DAYS Performed at [facility], [address], [place], [place] [address]    Report Status 07/07/2023 FINAL  Final  Culture, blood (Routine X 2) w Reflex to ID Panel     Status: None   Collection Time: 07/02/23 10:15 PM   Specimen: BLOOD RIGHT ARM  Result Value Ref Range Status   Specimen Description   Final    BLOOD RIGHT ARM Performed at [facility], [address], [place], [place] [address]    Special Requests   Final     BOTTLES DRAWN AEROBIC AND ANAEROBIC Blood Culture results may not be optimal due  to an inadequate volume of blood received in culture bottles Performed at [facility], [address], [place], [place] [address]    Culture   Final    NO GROWTH 5 DAYS Performed at [facility], [address], [place], [place] [address]    Report Status 07/07/2023 FINAL  Final  MRSA Next Gen by PCR, Nasal     Status: None   Collection Time: 07/02/23 11:57 PM   Specimen: Urine, Clean Catch; Nasal Swab  Result Value Ref Range Status   MRSA by PCR Next Gen NOT DETECTED NOT DETECTED Final    Comment: (NOTE) The GeneXpert MRSA Assay (FDA approved for NASAL specimens only), is one component of a comprehensive MRSA colonization surveillance program. It is not intended to diagnose MRSA infection nor to guide or monitor treatment for MRSA infections. Test performance is not FDA approved in patients less than 16 years old. Performed at [facility], [address] [place], [place] [address]   Resp panel by RT-PCR (RSV, Flu A&B, Covid) Anterior Nasal Swab     Status: None   Collection Time: 07/03/23  4:56 PM   Specimen: Anterior Nasal Swab  Result Value Ref Range Status   SARS Coronavirus 2 by RT PCR NEGATIVE NEGATIVE Final    Comment: (NOTE) SARS-CoV-2 target nucleic acids are NOT DETECTED.  The SARS-CoV-2 RNA is generally detectable in upper respiratory specimens during the acute phase of infection. The lowest concentration of SARS-CoV-2 viral copies this assay can detect is 138 copies/mL. A negative result does not preclude SARS-Cov-2 infection and should not be used as the sole basis for treatment or other patient management decisions. A negative result may occur with  improper specimen collection/handling, submission of specimen other than nasopharyngeal swab, presence of viral mutation(s) within the areas targeted by this assay, and inadequate number of  viral copies(<138 copies/mL). A negative result must be combined with clinical observations, patient history, and epidemiological information. The expected result is Negative.  Fact Sheet for Patients:  BloggerCourse.com  Fact Sheet for Healthcare Providers:  SeriousBroker.it  This test is no t yet approved or cleared by the [place]  FDA and  has been authorized for detection and/or diagnosis of SARS-CoV-2 by FDA under an Emergency Use Authorization (EUA). This EUA will remain  in effect (meaning this test can be used) for the duration of the COVID-19 declaration under Section 564(b)(1) of the Act, 21 U.S.C.section 360bbb-3(b)(1), unless the authorization is terminated  or revoked sooner.       Influenza A by PCR NEGATIVE NEGATIVE Final   Influenza B by PCR NEGATIVE NEGATIVE Final    Comment: (NOTE) The Xpert Xpress SARS-CoV-2/FLU/RSV plus assay is intended as an aid in the diagnosis of influenza from Nasopharyngeal swab specimens and should not be used as a sole basis for treatment. Nasal washings and aspirates are unacceptable for Xpert Xpress SARS-CoV-2/FLU/RSV testing.  Fact Sheet for Patients: BloggerCourse.com  Fact Sheet for Healthcare Providers: SeriousBroker.it  This test is not yet approved or cleared by the [place]  FDA and has been authorized for detection and/or diagnosis of SARS-CoV-2 by FDA under an Emergency Use Authorization (EUA). This EUA will remain in effect (meaning this test can be used) for the duration of the COVID-19 declaration under Section 564(b)(1) of the Act, 21 U.S.C. section 360bbb-3(b)(1), unless the authorization is terminated or revoked.     Resp Syncytial Virus by PCR NEGATIVE NEGATIVE Final    Comment: (NOTE) Fact  Sheet for Patients: BloggerCourse.com  Fact Sheet for Healthcare  Providers: SeriousBroker.it  This test is not yet approved or cleared by the [place]  FDA and has been authorized for detection and/or diagnosis of SARS-CoV-2 by FDA under an Emergency Use Authorization (EUA). This EUA will remain in effect (meaning this test can be used) for the duration of the COVID-19 declaration under Section 564(b)(1) of the Act, 21 U.S.C. section 360bbb-3(b)(1), unless the authorization is terminated or revoked.  Performed at [facility], [address] [place], [place] [address]   Respiratory (~20 pathogens) panel by PCR     Status: None   Collection Time: 07/03/23  4:56 PM   Specimen: Nasopharyngeal Swab; Respiratory  Result Value Ref Range Status   Adenovirus NOT DETECTED NOT DETECTED Final   Coronavirus 229E NOT DETECTED NOT DETECTED Final    Comment: (NOTE) The Coronavirus on the Respiratory Panel, DOES NOT test for the novel  Coronavirus (2019 nCoV)    Coronavirus HKU1 NOT DETECTED NOT DETECTED Final   Coronavirus NL63 NOT DETECTED NOT DETECTED Final   Coronavirus OC43 NOT DETECTED NOT DETECTED Final   Metapneumovirus NOT DETECTED NOT DETECTED Final   Rhinovirus / Enterovirus NOT DETECTED NOT DETECTED Final   Influenza A NOT DETECTED NOT DETECTED Final   Influenza B NOT DETECTED NOT DETECTED Final   Parainfluenza Virus 1 NOT DETECTED NOT DETECTED Final   Parainfluenza Virus 2 NOT DETECTED NOT DETECTED Final   Parainfluenza Virus 3 NOT DETECTED NOT DETECTED Final   Parainfluenza Virus 4 NOT DETECTED NOT DETECTED Final   Respiratory Syncytial Virus NOT DETECTED NOT DETECTED Final   Bordetella pertussis NOT DETECTED NOT DETECTED Final   Bordetella Parapertussis NOT DETECTED NOT DETECTED Final   Chlamydophila pneumoniae NOT DETECTED NOT DETECTED Final   Mycoplasma pneumoniae NOT DETECTED NOT DETECTED Final    Comment: Performed at [facility], [address] [place], [place] [address]     Radiology Studies: IR KYPHO LUMBAR INC FX REDUCE BONE BX UNI/BIL CANNULATION INC/IMAGING Result Date: 07/08/2023 INDICATION: 63 year old male presents for treatment of L2 compression fracture EXAM: IR KYPHO VERTEBRAL LUMBAR AUGMENTATION COMPARISON:  None Available. MEDICATIONS: As antibiotic prophylaxis, 2 g Ancef  was ordered pre-procedure and administered intravenously within 1 hour of incision. ANESTHESIA/SEDATION: Moderate (conscious) sedation was employed during this procedure. A total of Versed  3.0 mg and Fentanyl  150 mcg, 1 mg IV Dilaudid  was administered intravenously. Moderate Sedation Time: 22 minutes. The patient's level of consciousness and vital signs were monitored continuously by radiology nursing throughout the procedure under my direct supervision. FLUOROSCOPY TIME:  Reference air kerma: 717 mGy COMPLICATIONS: None PROCEDURE: Following a full explanation of the procedure along with the potentially associated complications, a witnessed informed consent was obtained. Specific risks that were discussed included bleeding, infection, injury to adjacent structures, neurologic injury, embolization of cement within the veins, failure of the procedure to improve pain, need for further procedure/ surgery, cardiopulmonary collapse, death. The patient understands the risks and wishes to proceed. The patient was placed prone on the fluoroscopic table. Nasal oxygen was administered. Physiologic monitoring was performed throughout the duration of the procedure. The skin overlying the L2 region was prepped and draped in the usual sterile fashion. The vertebral body was identified and the right pedicle was infiltrated with 1% lidocaine . This was then followed by the advancement of an 8-gauge Medtronic needle through the right pedicle into the mid to posterior 1/3. The left pedicle was then infiltrated with 1% lidocaine .  A small incision was made with 11 blade scalpel, and then a Medtronic 8 gauge needle was  advanced through the left pedicle into the posterior 1/3 of the vertebral body. Simultaneous inflation of the left and right pedicular cannula balloons was performed under fluoroscopic observation. Methylmethacrylate mixture was then reconstituted. Under biplane intermittent fluoroscopy, the methylmethacrylate was then injected into the vertebral body with filling of the fracture cleft, and excellent radiographic left-to-right filling. No extravasation was noted posteriorly into the spinal canal. Trace right-sided venous contamination was seen. The needles were then removed. Hemostasis was achieved at the skin entry site. There were no acute complications. Patient tolerated the procedure well. Patient was then observed in recovery before transfer. IMPRESSION: Status post image guided vertebral augmentation of L2 vertebral body, via bipedicular approach using kyphoplasty technique. Signed, Marciano Settles. Rexine Cater, RPVI [facility] Electronically Signed   By: Myrlene Asper D.O.   On: 07/08/2023 16:32    Scheduled Meds:  amiodarone   200 mg Oral Daily   apixaban   5 mg Oral BID   budesonide -glycopyrrolate -formoterol   2 puff Inhalation BID   feeding supplement  237 mL Oral TID BM   fentaNYL   1 patch Transdermal Q72H   fentaNYL   1 patch Transdermal Q72H   gabapentin   100 mg Oral QHS   lidocaine   1 patch Transdermal Q24H   metoprolol  succinate  25 mg Oral Daily   PARoxetine   10 mg Oral QHS   polyethylene glycol  17 g Oral Daily   predniSONE   5 mg Oral Q breakfast   senna-docusate  2 tablet Oral BID   sodium chloride   1 g Oral BID WC   Vitamin D  (Ergocalciferol )  50,000 Units Oral Q7 days   Continuous Infusions:   LOS: 8 days   Time spent: 50 minutes  Unk Garb, DO  [facility]  07/10/2023, 9:36 AM

## 2023-07-11 DIAGNOSIS — J9621 Acute and chronic respiratory failure with hypoxia: Secondary | ICD-10-CM | POA: Diagnosis not present

## 2023-07-11 DIAGNOSIS — S22080S Wedge compression fracture of T11-T12 vertebra, sequela: Secondary | ICD-10-CM | POA: Diagnosis not present

## 2023-07-11 DIAGNOSIS — M549 Dorsalgia, unspecified: Secondary | ICD-10-CM | POA: Diagnosis not present

## 2023-07-11 DIAGNOSIS — Z515 Encounter for palliative care: Secondary | ICD-10-CM | POA: Diagnosis not present

## 2023-07-11 DIAGNOSIS — Z9889 Other specified postprocedural states: Secondary | ICD-10-CM | POA: Diagnosis not present

## 2023-07-11 DIAGNOSIS — C3491 Malignant neoplasm of unspecified part of right bronchus or lung: Secondary | ICD-10-CM | POA: Diagnosis not present

## 2023-07-11 DIAGNOSIS — G893 Neoplasm related pain (acute) (chronic): Secondary | ICD-10-CM | POA: Diagnosis not present

## 2023-07-11 LAB — BASIC METABOLIC PANEL WITH GFR
Anion gap: 9 (ref 5–15)
BUN: 18 mg/dL (ref 8–23)
CO2: 26 mmol/L (ref 22–32)
Calcium: 8.2 mg/dL — ABNORMAL LOW (ref 8.9–10.3)
Chloride: 97 mmol/L — ABNORMAL LOW (ref 98–111)
Creatinine, Ser: 0.76 mg/dL (ref 0.61–1.24)
GFR, Estimated: >60 mL/min (ref >60)
Glucose, Bld: 114 mg/dL — ABNORMAL HIGH (ref 70–99)
Potassium: 4 mmol/L (ref 3.5–5.1)
Sodium: 132 mmol/L — ABNORMAL LOW (ref 135–145)

## 2023-07-11 LAB — MAGNESIUM: Magnesium: 2.1 mg/dL (ref 1.7–2.4)

## 2023-07-11 LAB — CBC
HCT: 31.8 % — ABNORMAL LOW (ref 39.0–52.0)
Hemoglobin: 10.3 g/dL — ABNORMAL LOW (ref 13.0–17.0)
MCH: 31 pg (ref 26.0–34.0)
MCHC: 32.4 g/dL (ref 30.0–36.0)
MCV: 95.8 fL (ref 80.0–100.0)
Platelets: 266 10*3/uL (ref 150–400)
RBC: 3.32 MIL/uL — ABNORMAL LOW (ref 4.22–5.81)
RDW: 18.6 % — ABNORMAL HIGH (ref 11.5–15.5)
WBC: 4.5 10*3/uL (ref 4.0–10.5)
nRBC: 0 % (ref 0.0–0.2)

## 2023-07-11 MED ORDER — SENNOSIDES-DOCUSATE SODIUM 8.6-50 MG PO TABS
2.0000 | ORAL_TABLET | Freq: Two times a day (BID) | ORAL | Status: DC
  Administered 2023-07-11 – 2023-07-20 (×13): 2 via ORAL
  Filled 2023-07-11 (×17): qty 2

## 2023-07-11 MED ORDER — HYDROMORPHONE HCL 2 MG PO TABS
2.0000 mg | ORAL_TABLET | ORAL | Status: DC | PRN
  Administered 2023-07-11 – 2023-07-13 (×3): 2 mg via ORAL
  Filled 2023-07-11 (×3): qty 1

## 2023-07-11 MED ORDER — HYDROMORPHONE HCL 1 MG/ML IJ SOLN
1.0000 mg | INTRAMUSCULAR | Status: DC | PRN
  Administered 2023-07-11 – 2023-07-13 (×6): 1 mg via INTRAVENOUS
  Filled 2023-07-11 (×6): qty 1

## 2023-07-11 NOTE — Progress Notes (Signed)
 Daily Progress Note   Patient Name: John [name]       Date: 07/11/2023 DOB: 08-30-1960  Age: 63 y.o. MRN#: [id number] Attending Physician: Unk Garb, DO Primary Care Physician: Patient, No Pcp Per Admit Date: 07/02/2023 Length of Stay: 9 days  Reason for Consultation/Follow-up: Establishing goals of care and Pain control  Subjective:   CC: Patient noting pain has overall improved.  Following up regarding complex medical decision making and symptom management.  Subjective:  Reviewed recent hospitalist, oncology, PT/OT, and mobility specialist documentation. At time of EMR review in past 24 hours patient has received as needed IV Dilaudid  1 mg x 4 doses.  Patient has not received any p.o. Dilaudid  as needed.  Presented to bedside to see patient.  Patient lying comfortably in bed.  No visitors present at bedside.  Able to follow-up regarding patient's pain management at this time.  Patient noted that he still having some aching muscle pain in his left lower extremity.  Patient does feel the Dilaudid  helps with management of this.  Discussed importance of taking oral Dilaudid  over IV Dilaudid  as patient would be able to continue oral Dilaudid  in outpatient setting.  Patient acknowledged this. Patient also admitted that he has a history of seizures (approximately 30 years ago which required Dilantin and Paxil  management) which is not noted in EMR.  Patient voiced concerns about being on gabapentin  though this has already been discontinued.  Noted gabapentin  likely did not cause any adverse effects as can assist with seizure management though agreed with discontinuing gabapentin  at this time especially since patient is not describing aspects of neuropathic pain.  Noted continuing opioid use to assist with pain management at this time.  Inquired about recent bowel movement.  Patient noted he had a very large bowel movement yesterday and a small bowel movement this morning.  Noted patient will need  to continue on bowel regimen moving forward to make sure he is having regular bowel movements while receiving opioids.  Spent time answering questions as able.  Noted palliative medicine team continue to follow with patient's medical journey.  Objective:   Vital Signs:  BP 105/75 (BP Location: Right Arm)   Pulse 86   Temp 98.3 F (36.8 C) (Oral)   Resp 18   Ht 5\' 11"  (1.803 m)   Wt 106.1 kg   SpO2 95%   BMI 32.64 kg/m   Physical Exam: General: NAD, alert, laying in bed Cardiovascular: RRR Respiratory: no increased work of breathing noted, not in respiratory distress Neuro: A&Ox4, following commands easily Psych: Pleasant   Assessment & Plan:   Assessment: John [name] is a 63 y.o. 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  Palliative medicine team consulted to assist with complex medical decision making and symptom management.  Recommendations/Plan: # Complex medical decision making/goals of care:  - Patient continues discussions with providers regarding care planning.  Patient underwent kyphoplasty appropriately on 07/08/2023.  Patient looking forward to participating with physical therapy to regain strength.  Patient planning to follow-up with oncology in the outpatient setting.  Palliative medicine team continuing to follow along and assist with conversations as able and appropriate.  -  Code Status: Full Code  # Symptom management: Patient is receiving these palliative interventions for symptom management with an intent to improve quality of life.   - Pain, severe in the  setting of metastatic squamous cell carcinoma of the lung now with back pain requiring kyphoplasty Patient's pain has improved with kyphoplasty on 07/08/2023.   - Continue fentanyl  patch at 37 mcg every 72 hour   - Continue oral Dilaudid  2 mg every 4 hours as needed   - Continue IV Dilaudid  1 mg every 4 hours as needed for breakthrough pain after oral as needed Dilaudid    - Continue Lidoderm  patch   - Gabapentin  has been discontinued.  Patient has a history of seizures, approximately 30 years ago, and is worried about the effects of gabapentin  for the seizures though gabapentin  can assist with seizure management.  Patient not describing aspects of neuropathic pain at this time anyway so agreed with discontinuing.   - Constipation Patient's last bowel movement noted to be 07/07/2023.   - Change senna to 2 tabs twice daily. Can decrease if needed based on symptom response.    - Continue MiraLAX  17 g daily   - Continue bisacodyl  suppository daily as needed  # Psychosocial Support:  - Wife  # Discharge Planning: To Be Determined  - Placed outpatient palliative medicine referral on 07/08/2023 for follow-up at [facility].   Thank you for allowing the palliative care team to participate in the care Hymie Laughridge.  Barnett Libel, DO Palliative Care Provider PMT # [phone]  If patient remains symptomatic despite maximum doses, please call PMT at [phone] between 0700 and 1900. Outside of these hours, please call attending, as PMT does not have night coverage.

## 2023-07-11 NOTE — Progress Notes (Signed)
 John [name] had the procedure with a kyphoplasty on Friday.  Hopefully, we will start to see that he is improving.  He is still weak in the legs, particularly the left leg.  He thinks this may be from the gabapentin  that he is on.  This has been stopped.  I really think that he might be a candidate for [facility] re[facility].  I think it would be worthwhile checking this out for him.  He has not complained of any shortness of breath.  He has had no problems with nausea or vomiting.  He is going to the bathroom.  He has had no bleeding.  He is on anticoagulation because of the history of atrial fibrillation and the having pulmonary embolism.  His labs show sodium 132.  Potassium 4.0.  BUN 18 creatinine 0.76.  Calcium  8.2 his white cell count is 4.5.  Hemoglobin 10.3.  Platelet count 266,000.  His vital signs are stable.  Temperature 98.3.  Pulse 86.  Blood pressure 105/75.  Head and neck exam shows no ocular or oral lesions.  There are no palpable cervical or supraclavicular lymph nodes.  Lungs are clear bilaterally.  He has good air movement bilaterally.  Cardiac exam regular rate and rhythm.  He has no murmurs.  Abdomen is soft.  Bowel sounds are present.  He has no fluid wave.  There is no guarding or rebound tenderness.  Extremities does show some weakness in the left hip.  Neurological exam shows no focal neurological deficits.   At this point, I think the focus is trying to get him more independent.  I think this will involve a lot of physical therapy.  Again, I think that it would be worthwhile looking into [facility].  I know that he is motivated.  He really wants to try to be able to have more independence.  At this point, the cancer is not progressing.  There is nothing in the spine that looks like it is malignant.  His blood counts are improving nicely.  I do appreciate all the great care he is getting from everybody on [address].   Rayleen Cal, MD  John 15:13

## 2023-07-11 NOTE — Progress Notes (Signed)
 PROGRESS NOTE    John [name]  YQI:[id number] DOB: Aug 24, 1960 DOA: 07/02/2023 PCP: Patient, No Pcp Per  Subjective: Pt seen and examined. Pt is trying hard with PT. Does not want to burden his wife with his care. He wants to try rehab. Discussed that he needs to be able to participate with PT at least 3 hours per day. Pt is only able to stand at bedside right now. Doubt he will qualify for CIR.   Hospital Course: HPI: John [name] is a 63 y.o. 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  Patient reports she has had back pain for months but the pain was too severe today to tolerate.  His home Percocet is not controlling his pain.  He describes the pain as sharp in his low back, radiating to both legs. He also reports difficulty breathing today.  On EMS arrival, patient was found to have SpO2 of 88% which improved with 6 L [place]. He reports difficulty walking due to the pain, poor appetite and feeling thirsty but denies any fevers, chills, cough, nausea, vomiting, abdominal pain, headache or dizziness.   ED Course: Initial vitals show temp 98.1, RR 19, HR 90, BP 161/101 and SpO2 95% on 6 L [place].  Labs significant for sodium 124, K+ 3.3, creatinine 0.87, WBC 1.3 (from 4.3 1-day ago), Hgb 11.3, platelet 175, mag 1.8, Phos 2.4. CXR shows right basilar consolidation concerning for acute airway disease versus atelectasis. CTA PE study negative for PE but shows signs of possible multifocal pneumonia. Patient received IV morphine  1 mg x 2, IV ketamine  32 mg x 1, Toradol  15 mg x 1, IV NS 500 cc x 1, IV cefepime  and IV vancomycin . TRH  was consulted for admission.  Significant Events: Admitted 07/02/2023 acute on chronic respiratory failure with hypoxia   Significant Labs: Covid/flu/RSV negative RVP negative Na 124, K 3.5, CO2 of 19, BUN 14, Scr 0.78, glu 105 INR 1.4 WBC 1.0, HgB 11.2, plt 205 Legionella urinary antigen Negative Strep Pneumo antigen Negative  Significant Imaging Studies: CXR Increasing right basilar consolidation superimposed upon chronic post radiation fibrosis, which may reflect acute airspace disease or atelectasis. CTPA No evidence for pulmonary embolism. 2. Stable multifocal areas of ground-glass opacity throughout both lungs along with some peripheral reticular opacities. Findings are favored as infectious/inflammatory. 3. Stable focal airspace opacity in the right perihilar region and superior segment of the right lower lobe with some central cavitation. Findings may be  infectious/inflammatory. 4. Stable 3.3 cm infrarenal abdominal aortic aneurysm. Recommend follow-up ultrasound every 3 years. Aortic Atherosclerosis (ICD10-I70.0) and Emphysema (ICD10-J43.9)  CT T-spine Chronic compression deformities from T10 through L2, with evidence of prior vertebral augmentation at T12 and L1. No acute thoracic spine fracture. Abd XR No significant formed stool in the colon. 2. Air scattered throughout nondilated small bowel, query ileus Pelvic XR Mild bilateral femoroacetabular osteoarthritis  MRI T-spine No evidence for metastatic disease within the thoracic spine. 2. Evolving subacute compression fracture involving the superior endplate of T11 with no more than mild residual marrow edema. Height loss is not significantly changed or progressed as compared to prior MRI from 06/06/2023. 3. Chronic compression deformities involving the  T12 and L1 vertebral bodies with sequelae of prior vertebral augmentation, stable. 4. Small disc protrusions at T6-7 and T7-8 without significant stenosis. Moderate right with mild  left foraminal stenosis at T10-11. 5. Post treatment changes within the visualized right lung with known right adrenal metastasis, described on recent CTs. MRI L-spine Acute compression fracture involving the inferior endplate of L2 with up to 60% height loss without significant bony retropulsion. This is superimposed on an underlying chronic L2 compression fracture, and is benign/mechanical in appearance. No evidence for metastatic disease within the lumbar spine. 2. Additional chronic compression deformities involving the T12, L1, L3, L4, and L5 vertebral bodies, otherwise stable. 3. Multifactorial degenerative changes at L4-5 with resultant moderate canal and left greater than right lateral recess stenosis, with mild to moderate bilateral L4 foraminal narrowing. 4. Left foraminal to extraforaminal disc protrusion at L3-4, potentially affecting the exiting left L3 nerve root. 5. 3.7 cm intra-abdominal aortic aneurysm. Recommend follow-up every 2 years.   Antibiotic Therapy: Anti-infectives (From admission, onward)    Start     Dose/Rate Route Frequency Ordered Stop   07/07/23 0600  ceFAZolin  (ANCEF ) IVPB 2g/100 mL premix       Note to Pharmacy: Patient is at [facility] and will transfer round-trip to [facility] for procedure.   2 g 200 mL/hr over 30 Minutes Intravenous To Radiology 07/05/23 1734 07/08/23 0600   07/03/23 0800  vancomycin  (VANCOCIN ) IVPB 1000 mg/200 mL premix  Status:  Discontinued        1,000 mg 200 mL/hr over 60 Minutes Intravenous Every 12 hours 07/02/23 2146 07/03/23 0722   07/03/23 0200  ceFEPIme  (MAXIPIME ) 2 g in sodium chloride  0.9 % 100 mL IVPB        2 g 200 mL/hr over 30 Minutes Intravenous Every 8 hours 07/02/23 2141     07/02/23 1845  vancomycin  (VANCOREADY) IVPB 2000 mg/400 mL        2,000 mg 200 mL/hr over 120 Minutes Intravenous  Once 07/02/23 1813 07/02/23 2120   07/02/23 1815  vancomycin  (VANCOCIN ) IVPB 1000 mg/200 mL premix  Status:  Discontinued        1,000 mg 200 mL/hr  over 60 Minutes Intravenous  Once 07/02/23 1807 07/02/23 1813   07/02/23 1815  ceFEPIme  (MAXIPIME ) 2 g in sodium chloride  0.9 % 100 mL IVPB        2 g 200 mL/hr over 30 Minutes Intravenous  Once 07/02/23 1807 07/02/23 1911       Procedures: 07-08-2023 lumbar L2 kyphoplasty  Consultants: Palliative care Heme/onc IR    Assessment and Plan: * Acute on chronic respiratory failure with hypoxia (HCC) 07-02-2023 through 07-05-2023 Uses 3 L nasal cannula O2 at baseline.  He desaturated to 83% and required up to 15 L high flow nasal cannula.  Currently on 4 L nasal cannula O2 - MRSA PCR negative - Strep pneumo antigen negative - Legionella antigen pending - COVID, RSV, influenza, respiratory viral panel negative  - Empiric cefepime   07-06-2023 no evidence of pneumonia. Pt was on empiric cefepime  due to neutropenia. No fevers. Discussed with oncology. Will stop IV Cefepime .  07-07-2023 stable on 3 L/min. Abx stopped on 07-06-2023.  07-08-2023 stable. On 3 L/min.  07-09-2023 stable on 3 L/min.  07-10-2023 stable.  07-11-2023 stable on supplemental O2. Currently on 5 L/min. Stable.  Pain of metastatic malignancy 07-06-2023 continue with IV dilaudid  prn. Has fentanyl  patch.  07-07-2023 continue with IV dilaudid  prn. Continue with fentanyl  patch.  07-08-2023 continue with  IV dilaudid  prn. On fentanyl  patch scheduled and prn oral dilaudid .  07-09-2023 continue with IV dilaudid  prn. On fentanyl  patch scheduled and prn oral dilaudid .  07-10-2023 continue with IV dilaudid  prn. On fentanyl  patch scheduled and prn oral dilaudid .  05-26-205 still using IV dilaudid  prn. On scheduled fentanyl  patch and prn oral dilaudid . Will continue IV dilaudid  prn for now.  Intractable back pain 07-02-2023 through 07-06-2023 due to vertebral compression fractures.  Appreciate palliative care medicine for symptom management.  Started fentanyl  patch, Dilaudid , Lidoderm  patch, Robaxin , gabapentin  - CT  thoracic spine revealed chronic compression deformities T10-L2.  He did have kyphoplasty of T12 back in July 2023, and kyphoplasty L1 and L3 in August 2023 - MRI thoracic and lumbar spine revealed L2 compression fracture - IR consulted for kyphoplasty  07-06-2023 Was on schedule for have kyphoplasty tomorrow. Just notified by IR that pt's insurance company wants more information regarding his back pain. Continue with IV dilaudid  prn.  07-07-2023 still awaiting on insurance authorization. Continue with prn IV dilaudid  for pain control.  PT assessment cannot being until after his kyphoplasty.  07-08-2023 Pt and wife are expectedly upset that insurance authorization has not been obtained. I explained to both of them that providers have no control on what insurance companies authorize or reject. Pt states he called insurance company and he states insurance company claims they don't have any requests for kyphoplasty authorization. I have reached out to IR APP to make sure that request for authorization has been sent by radiology group to pt's insurance company.  Continue IV Dilaudid  prn.   07-09-2023 had L2 kyphoplasty yesterday. Continue prn IV dilaudid . Start PT.  07-10-2023 continue IV dilaudid . On po dilaudid  prn and scheduled fentanyl  patch. Continue with PT efforts. May need SNF vs home health vs CIR. Will see how he does this holiday weekend(memorial day).  07-11-2023 continue with po dilaudid  prn and scheduled fentanyl  patch. Continue IV dilaudid  prn. Still using IV dilaudid  but not as much. Pt will need SNF placement for PT. I don't think he is a candidate for CIR. Pt only able to tolerate 13 mins of PT today.  S/P kyphoplasty - L2 on 07-08-2023 07-09-2023 had L2 kyphoplasty yesterday. Continue prn IV dilaudid . Start PT.  07-11-2023 still using IV dilaudid  prn. On scheduled fentanyl  patch and prn oral dilaudid . Will continue IV dilaudid  prn for now.  Obesity, Class I, BMI 30-34.9 Body mass  index is 32.64 kg/m.   PAF (paroxysmal atrial fibrillation) (HCC) 07-06-2023 continue with IV heparin  gtts pending his kyphoplasty. On Toprol  XL  07-07-2023 stable. Remains on IV heparin  gtts.  07-08-2023 continue IV heparin  gtts. Awaiting pt to complete kyphoplasty prior to restarting po Eliquis   07-09-2023 eliquis  restarted last night. Stable.  07-10-2023 stable on Eliquis .  07-11-2023 stable on Eliquis , toprol -XL 25 mg daily.  SIADH (syndrome of inappropriate ADH production) (HCC) 07-06-2023 Na stable. Na 128. - Urine osmole 575  - Urine sodium 129 - Salt tabs and fluid restriction - Improved  07-07-2023 Na 130 today.   07-09-2023 stable. Na 131 today.  07-11-2023 Na 132. Stable.   Hyponatremia 07-06-2023 Na stable. Na 128. - Urine osmole 575  - Urine sodium 129 - Salt tabs and fluid restriction - Improved  07-07-2023 Na 130 today.  07-08-2023 stable.  07-09-2023 stable. Na 131 today.  07-11-2023 Na 132. Stable.  Stage 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 (HCC) 07-06-2023 - Currently under active therapy with  Gemzar  and Navelbine  - Followed by Dr.  Ennever  07-07-2023 stable.  07-08-2023 oncology following.  07-09-2023 followed by oncology  07-10-2023 followed by oncology  07-11-2023 oncology following.  DVT prophylaxis:  apixaban  (ELIQUIS ) tablet 5 mg     Code Status: Full Code Family Communication: no family at bedside. Pt is decisional. Disposition Plan: SNF Reason for continuing need for hospitalization: medically stable for discharge.  Objective: Vitals:   07/10/23 0911 07/10/23 1100 07/10/23 1946 07/11/23 0537  BP: 100/76 111/72 103/61 105/75  Pulse: 94 90 85 86  Resp:  20 18 18   Temp:  98.1 F (36.7 C) 98 F (36.7 C) 98.3 F (36.8 C)  TempSrc:  Oral Oral Oral  SpO2: 93% 97% 95% 95%  Weight:      Height:        Intake/Output Summary (Last 24 hours) at 07/11/2023 1157 Last data filed at 07/11/2023 0727 Gross per 24 hour   Intake 480 ml  Output 700 ml  Net -220 ml   Filed Weights   07/03/23 0155  Weight: 106.1 kg    Examination:  Physical Exam Vitals and nursing note reviewed.  Constitutional:      Appearance: He is obese.  HENT:     Head: Normocephalic and atraumatic.  Eyes:     General: No scleral icterus. Cardiovascular:     Rate and Rhythm: Normal rate and regular rhythm.  Pulmonary:     Effort: Pulmonary effort is normal.     Breath sounds: Normal breath sounds.  Abdominal:     General: Bowel sounds are normal. There is no distension.     Palpations: Abdomen is soft.  Musculoskeletal:     Comments: Pt able to roll side to side in bed without assistance. No c/o of back pain with rolling side to side.  Skin:    General: Skin is warm and dry.     Capillary Refill: Capillary refill takes less than 2 seconds.  Neurological:     Mental Status: He is alert and oriented to person, place, and time.     Data Reviewed: I have personally reviewed following labs and imaging studies  CBC: Recent Labs  Lab 07/06/23 0257 07/07/23 0359 07/08/23 0330 07/10/23 0357 07/11/23 0401  WBC 1.3* 2.1* 3.0* 4.1 4.5  NEUTROABS 0.2* 0.6*  --   --   --   HGB 10.1* 10.5* 10.5* 10.9* 10.3*  HCT 30.8* 32.0* 32.0* 34.1* 31.8*  MCV 91.9 92.8 93.3 96.1 95.8  PLT 211 234 267 287 266   Basic Metabolic Panel: Recent Labs  Lab 07/05/23 0428 07/06/23 0257 07/07/23 0359 07/09/23 0409 07/11/23 0401  NA 131* 128* 130* 131* 132*  K 3.7 3.4* 4.2 4.0 4.0  CL 103 103 101 97* 97*  CO2 20* 19* 22 26 26   GLUCOSE 107* 125* 107* 113* 114*  BUN 17 17 14 17 18   CREATININE 0.90 0.76 0.78 0.91 0.76  CALCIUM  8.1* 7.9* 8.2* 8.3* 8.2*  MG  --   --   --   --  2.1   GFR: Estimated Creatinine Clearance: 120.1 mL/min (by C-G formula based on SCr of 0.76 mg/dL). Coagulation Profile: Recent Labs  Lab 07/04/23 1318 07/07/23 0359  INR 1.4* 1.1   Recent Results (from the past 240 hours)  Culture, blood (Routine X 2)  w Reflex to ID Panel     Status: None   Collection Time: 07/02/23 10:15 PM   Specimen: BLOOD RIGHT ARM  Result Value Ref Range Status   Specimen Description   Final  BLOOD RIGHT ARM Performed at [facility], [address] [place], [place] [address]    Special Requests   Final    BOTTLES DRAWN AEROBIC AND ANAEROBIC Blood Culture results may not be optimal due to an inadequate volume of blood received in culture bottles Performed at [facility], [address], [place], [place] [address]    Culture   Final    NO GROWTH 5 DAYS Performed at [facility], [address] [place], [place] [address]    Report Status 07/07/2023 FINAL  Final  Culture, blood (Routine X 2) w Reflex to ID Panel     Status: None   Collection Time: 07/02/23 10:15 PM   Specimen: BLOOD RIGHT ARM  Result Value Ref Range Status   Specimen Description   Final    BLOOD RIGHT ARM Performed at [facility], [address], [place], [place] [address]    Special Requests   Final    BOTTLES DRAWN AEROBIC AND ANAEROBIC Blood Culture results may not be optimal due to an inadequate volume of blood received in culture bottles Performed at [facility], [address], [place], [place] [address]    Culture   Final    NO GROWTH 5 DAYS Performed at [facility], [address] [place], [place] [address]    Report Status 07/07/2023 FINAL  Final  MRSA Next Gen by PCR, Nasal     Status: None   Collection Time: 07/02/23 11:57 PM   Specimen: Urine, Clean Catch; Nasal Swab  Result Value Ref Range Status   MRSA by PCR Next Gen NOT DETECTED NOT DETECTED Final    Comment: (NOTE) The GeneXpert MRSA Assay (FDA approved for NASAL specimens only), is one component of a comprehensive MRSA colonization surveillance program. It is not intended to diagnose MRSA infection nor to guide or monitor treatment for MRSA infections. Test performance is not FDA approved in  patients less than 66 years old. Performed at [facility], [address], [place], [place] [address]   Resp panel by RT-PCR (RSV, Flu A&B, Covid) Anterior Nasal Swab     Status: None   Collection Time: 07/03/23  4:56 PM   Specimen: Anterior Nasal Swab  Result Value Ref Range Status   SARS Coronavirus 2 by RT PCR NEGATIVE NEGATIVE Final    Comment: (NOTE) SARS-CoV-2 target nucleic acids are NOT DETECTED.  The SARS-CoV-2 RNA is generally detectable in upper respiratory specimens during the acute phase of infection. The lowest concentration of SARS-CoV-2 viral copies this assay can detect is 138 copies/mL. A negative result does not preclude SARS-Cov-2 infection and should not be used as the sole basis for treatment or other patient management decisions. A negative result may occur with  improper specimen collection/handling, submission of specimen other than nasopharyngeal swab, presence of viral mutation(s) within the areas targeted by this assay, and inadequate number of viral copies(<138 copies/mL). A negative result must be combined with clinical observations, patient history, and epidemiological information. The expected result is Negative.  Fact Sheet for Patients:  BloggerCourse.com  Fact Sheet for Healthcare Providers:  SeriousBroker.it  This test is no t yet approved or cleared by the [place]  FDA and  has been authorized for detection and/or diagnosis of SARS-CoV-2 by FDA under an Emergency Use Authorization (EUA). This EUA will remain  in effect (meaning this test can be used) for the duration of the COVID-19 declaration under  Section 564(b)(1) of the Act, 21 U.S.C.section 360bbb-3(b)(1), unless the authorization is terminated  or revoked sooner.       Influenza A by PCR NEGATIVE NEGATIVE Final   Influenza B by PCR NEGATIVE NEGATIVE Final    Comment: (NOTE) The Xpert Xpress  SARS-CoV-2/FLU/RSV plus assay is intended as an aid in the diagnosis of influenza from Nasopharyngeal swab specimens and should not be used as a sole basis for treatment. Nasal washings and aspirates are unacceptable for Xpert Xpress SARS-CoV-2/FLU/RSV testing.  Fact Sheet for Patients: BloggerCourse.com  Fact Sheet for Healthcare Providers: SeriousBroker.it  This test is not yet approved or cleared by the [place]  FDA and has been authorized for detection and/or diagnosis of SARS-CoV-2 by FDA under an Emergency Use Authorization (EUA). This EUA will remain in effect (meaning this test can be used) for the duration of the COVID-19 declaration under Section 564(b)(1) of the Act, 21 U.S.C. section 360bbb-3(b)(1), unless the authorization is terminated or revoked.     Resp Syncytial Virus by PCR NEGATIVE NEGATIVE Final    Comment: (NOTE) Fact Sheet for Patients: BloggerCourse.com  Fact Sheet for Healthcare Providers: SeriousBroker.it  This test is not yet approved or cleared by the [place]  FDA and has been authorized for detection and/or diagnosis of SARS-CoV-2 by FDA under an Emergency Use Authorization (EUA). This EUA will remain in effect (meaning this test can be used) for the duration of the COVID-19 declaration under Section 564(b)(1) of the Act, 21 U.S.C. section 360bbb-3(b)(1), unless the authorization is terminated or revoked.  Performed at [facility], [address], [place], [place] [address]   Respiratory (~20 pathogens) panel by PCR     Status: None   Collection Time: 07/03/23  4:56 PM   Specimen: Nasopharyngeal Swab; Respiratory  Result Value Ref Range Status   Adenovirus NOT DETECTED NOT DETECTED Final   Coronavirus 229E NOT DETECTED NOT DETECTED Final    Comment: (NOTE) The Coronavirus on the Respiratory Panel, DOES NOT test for  the novel  Coronavirus (2019 nCoV)    Coronavirus HKU1 NOT DETECTED NOT DETECTED Final   Coronavirus NL63 NOT DETECTED NOT DETECTED Final   Coronavirus OC43 NOT DETECTED NOT DETECTED Final   Metapneumovirus NOT DETECTED NOT DETECTED Final   Rhinovirus / Enterovirus NOT DETECTED NOT DETECTED Final   Influenza A NOT DETECTED NOT DETECTED Final   Influenza B NOT DETECTED NOT DETECTED Final   Parainfluenza Virus 1 NOT DETECTED NOT DETECTED Final   Parainfluenza Virus 2 NOT DETECTED NOT DETECTED Final   Parainfluenza Virus 3 NOT DETECTED NOT DETECTED Final   Parainfluenza Virus 4 NOT DETECTED NOT DETECTED Final   Respiratory Syncytial Virus NOT DETECTED NOT DETECTED Final   Bordetella pertussis NOT DETECTED NOT DETECTED Final   Bordetella Parapertussis NOT DETECTED NOT DETECTED Final   Chlamydophila pneumoniae NOT DETECTED NOT DETECTED Final   Mycoplasma pneumoniae NOT DETECTED NOT DETECTED Final    Comment: Performed at [facility], [address], [place], [place] [address]     Radiology Studies: No results found.  Scheduled Meds:  amiodarone   200 mg Oral Daily   apixaban   5 mg Oral BID   budesonide -glycopyrrolate -formoterol   2 puff Inhalation BID   feeding supplement  237 mL Oral TID BM   fentaNYL   1 patch Transdermal Q72H   fentaNYL   1 patch Transdermal Q72H   lidocaine   1 patch Transdermal Q24H   metoprolol  succinate  25 mg Oral Daily   PARoxetine   10 mg Oral QHS   polyethylene glycol  17 g Oral Daily   predniSONE   5 mg Oral Q breakfast   senna-docusate  2 tablet Oral BID   sodium chloride   1 g Oral BID WC   Vitamin D  (Ergocalciferol )  50,000 Units Oral Q7 days   Continuous Infusions:   LOS: 9 days   Time spent: 45 minutes  Unk Garb, DO  [facility]  07/11/2023, 11:57 AM

## 2023-07-11 NOTE — Progress Notes (Signed)
 Mobility Specialist - Progress Note  ([place] 5L) Pre-mobility: 97% SpO2 During mobility: 87% SpO2 Post-mobility: 91% SPO2   07/11/23 0948  Mobility  Activity Transferred from bed to chair  Level of Assistance Contact guard assist, steadying assist  Assistive Device Stedy  Range of Motion/Exercises Active Assistive  Activity Response Tolerated fair  Mobility visit 1 Mobility  Mobility Specialist Start Time (ACUTE ONLY) 0935  Mobility Specialist Stop Time (ACUTE ONLY) 0948  Mobility Specialist Time Calculation (min) (ACUTE ONLY) 13 min   Pt was found in bed and agreeable to mobilize. Grew SOB with session and SPO2 reading 87% with transfer. Able to recover within 1 min and was left on recliner chair with all needs met. Call bell in reach.  Lorna Rose,  Mobility Specialist Can be reached via Secure Chat

## 2023-07-12 DIAGNOSIS — Z9889 Other specified postprocedural states: Secondary | ICD-10-CM | POA: Diagnosis not present

## 2023-07-12 DIAGNOSIS — G893 Neoplasm related pain (acute) (chronic): Secondary | ICD-10-CM | POA: Diagnosis not present

## 2023-07-12 DIAGNOSIS — M549 Dorsalgia, unspecified: Secondary | ICD-10-CM | POA: Diagnosis not present

## 2023-07-12 DIAGNOSIS — J9621 Acute and chronic respiratory failure with hypoxia: Secondary | ICD-10-CM | POA: Diagnosis not present

## 2023-07-12 LAB — CBC
HCT: 32 % — ABNORMAL LOW (ref 39.0–52.0)
Hemoglobin: 10.3 g/dL — ABNORMAL LOW (ref 13.0–17.0)
MCH: 30.8 pg (ref 26.0–34.0)
MCHC: 32.2 g/dL (ref 30.0–36.0)
MCV: 95.8 fL (ref 80.0–100.0)
Platelets: 266 10*3/uL (ref 150–400)
RBC: 3.34 MIL/uL — ABNORMAL LOW (ref 4.22–5.81)
RDW: 18.6 % — ABNORMAL HIGH (ref 11.5–15.5)
WBC: 4.8 10*3/uL (ref 4.0–10.5)
nRBC: 0.4 % — ABNORMAL HIGH (ref 0.0–0.2)

## 2023-07-12 NOTE — [place] FL2 (Signed)
 [place]  MEDICAID FL2 LEVEL OF CARE FORM     IDENTIFICATION  Patient Name: John [name] Birthdate: [birth year] Sex: male Admission Date (Current Location): 07/02/2023  [facility] and [place] Number:  Producer, television/film/video and Address:  [facility],  [address] [place], [place] [address]      Provider Number: [id number]  Attending Physician Name and Address:  Unk Garb, DO  Relative Name and Phone Number:  Juniper Snyders (spouse) Ph: [phone]    Current Level of Care: Hospital Recommended Level of Care: Skilled Nursing Facility Prior Approval Number:    Date Approved/Denied:   PASRR Number: [id number]  Discharge Plan: SNF    Current Diagnoses: Patient Active Problem List   Diagnosis Date Noted   S/P kyphoplasty - L2 on 07-08-2023 07/09/2023   High risk medication use 07/08/2023   Drug-induced constipation 07/08/2023   Need for emotional support 07/07/2023   Counseling and coordination of care 07/07/2023   Squamous cell carcinoma of lung (HCC) 07/07/2023   Medication management 07/07/2023   Cancer associated pain 07/07/2023   Palliative care encounter 07/07/2023   Acute on chronic respiratory failure with hypoxia (HCC) 07/02/2023   Pain of metastatic malignancy 07/02/2023   Pancytopenia (HCC) 06/08/2023   Back pain 06/06/2023   Hyperthyroidism 03/31/2023   GERD (gastroesophageal reflux disease) 12/07/2021   Intractable back pain 08/24/2021   T12 compression fracture (HCC) 08/23/2021   Mixed hyperlipidemia 08/02/2021   Coronary artery disease involving native coronary artery of native heart without angina pectoris 08/02/2021   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) 07/29/2021   Obesity, Class I, BMI 30-34.9 07/27/2021   Chronic respiratory failure with hypoxia (HCC) 04/14/2021   Hypercoagulable state due to persistent atrial fibrillation (HCC) 10/28/2020   Malignant neoplasm of lung (HCC)    RA (rheumatoid arthritis) (HCC) 10/06/2020    PAF (paroxysmal atrial fibrillation) (HCC) 08/25/2020   Drug-induced neutropenia (HCC) 08/11/2020   SIADH (syndrome of inappropriate ADH production) (HCC)    Hyponatremia 05/12/2020   Occlusion of right pulmonary artery (HCC) 05/12/2020   Neutropenia (HCC) 05/12/2020   Stage 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 (HCC) 04/10/2020   COPD with asthma (HCC) 03/24/2020   Chronic anxiety 11/08/2014   Petit mal seizure status (HCC) 11/08/2014   Age-related nuclear cataract of left eye 08/12/2014    Orientation RESPIRATION BLADDER Height & Weight     Self, Situation, Time, Place  O2 (3-4L/min) Continent Weight: 234 lb (106.1 kg) Height:  5\' 11"  (180.3 cm)  BEHAVIORAL SYMPTOMS/MOOD NEUROLOGICAL BOWEL NUTRITION STATUS      Continent Diet (Regular diet)  AMBULATORY STATUS COMMUNICATION OF NEEDS Skin   Extensive Assist Verbally Other (Comment) (Ecchymosis: bilateral arms; Erythema: buttocks)                       Personal Care Assistance Level of Assistance  Bathing, Feeding, Dressing Bathing Assistance: Limited assistance Feeding assistance: Independent Dressing Assistance: Limited assistance     Functional Limitations Info  Sight, Hearing, Speech Sight Info: Impaired Hearing Info: Adequate Speech Info: Adequate    SPECIAL CARE FACTORS FREQUENCY  PT (By licensed PT), OT (By licensed OT)     PT Frequency: 5x's/week OT Frequency: 5x's/week            Contractures Contractures Info: Not present    Additional Factors Info  Code Status, Allergies, Psychotropic Code Status Info: Full Allergies Info: Ventolin  (Albuterol ), Lipitor (Atorvastatin ), Oxycodone , Ketamine  Psychotropic Info: See discharge summary  Current Medications (07/12/2023):  This is the current hospital active medication list Current Facility-Administered Medications  Medication Dose Route Frequency Provider Last Rate Last Admin   acetaminophen  (TYLENOL ) tablet 650 mg  650 mg Oral Q6H PRN  Amponsah, Prosper M, MD       amiodarone  (PACERONE ) tablet 200 mg  200 mg Oral Daily Amponsah, Prosper M, MD   200 mg at 07/12/23 8657   apixaban  (ELIQUIS ) tablet 5 mg  5 mg Oral BID Unk Garb, DO   5 mg at 07/12/23 8469   benzonatate  (TESSALON ) capsule 200 mg  200 mg Oral TID PRN Amponsah, Prosper M, MD       bisacodyl  (DULCOLAX) suppository 10 mg  10 mg Rectal Daily PRN Ferolito, Michelle Y, NP   10 mg at 07/06/23 1636   budesonide -glycopyrrolate -formoterol  (BREZTRI ) 160-9-4.8 MCG/ACT inhaler 2 puff  2 puff Inhalation BID Vita Grip, MD   2 puff at 07/12/23 0916   feeding supplement (ENSURE ENLIVE / ENSURE PLUS) liquid 237 mL  237 mL Oral TID BM Amponsah, Prosper M, MD   237 mL at 07/12/23 0930   fentaNYL  (DURAGESIC ) 12 MCG/HR 1 patch  1 patch Transdermal Q72H Mims, Lauren W, DO   1 patch at 07/09/23 2009   fentaNYL  (DURAGESIC ) 25 MCG/HR 1 patch  1 patch Transdermal Q72H Mims, Lauren W, DO   1 patch at 07/09/23 2014   HYDROmorphone  (DILAUDID ) injection 1 mg  1 mg Intravenous Q4H PRN Richardson Chang, DO   1 mg at 07/12/23 0845   HYDROmorphone  (DILAUDID ) tablet 2 mg  2 mg Oral Q4H PRN Richardson Chang, DO   2 mg at 07/11/23 1359   levalbuterol  (XOPENEX ) nebulizer solution 0.63 mg  0.63 mg Nebulization Q8H PRN Amponsah, Prosper M, MD       lidocaine  (LIDODERM ) 5 % 1 patch  1 patch Transdermal Q24H Vita Grip, MD   1 patch at 07/11/23 2244   methocarbamol  (ROBAXIN ) injection 1,000 mg  1,000 mg Intravenous Q6H PRN Daren Eck, DO   1,000 mg at 07/10/23 0912   metoprolol  succinate (TOPROL -XL) 24 hr tablet 25 mg  25 mg Oral Daily Amponsah, Prosper M, MD   25 mg at 07/12/23 6295   ondansetron  (ZOFRAN ) tablet 4 mg  4 mg Oral Q6H PRN Amponsah, Prosper M, MD       Or   ondansetron  (ZOFRAN ) injection 4 mg  4 mg Intravenous Q6H PRN Amponsah, Prosper M, MD       PARoxetine  (PAXIL ) tablet 10 mg  10 mg Oral QHS Amponsah, Prosper M, MD   10 mg at 07/11/23 2236   polyethylene glycol (MIRALAX  /  GLYCOLAX ) packet 17 g  17 g Oral Daily Ferolito, Michelle Y, NP   17 g at 07/10/23 2841   predniSONE  (DELTASONE ) tablet 5 mg  5 mg Oral Q breakfast Amponsah, Prosper M, MD   5 mg at 07/12/23 0843   senna-docusate (Senokot-S) tablet 2 tablet  2 tablet Oral BID Richardson Chang, DO   2 tablet at 07/12/23 0843   sodium chloride  flush (NS) 0.9 % injection 10-40 mL  10-40 mL Intracatheter PRN Vita Grip, MD       sodium chloride  tablet 1 g  1 g Oral BID WC Daren Eck, DO   1 g at 07/12/23 0931   Vitamin D  (Ergocalciferol ) (DRISDOL ) 1.25 MG (50000 UNIT) capsule 50,000 Units  50,000 Units Oral Q7 days Ivor Mars, MD   50,000 Units at 07/06/23  1050   Facility-Administered Medications Ordered in Other Encounters  Medication Dose Route Frequency Provider Last Rate Last Admin   ondansetron  (ZOFRAN ) injection 8 mg  8 mg Intravenous Once Ennever, Peter R, MD         Discharge Medications: Please see discharge summary for a list of discharge medications.  Relevant Imaging Results:  Relevant Lab Results:   Additional Information SSN: [id number]  Zenon Hilda, LCSW

## 2023-07-12 NOTE — Progress Notes (Signed)
 Inpatient Rehab Admissions Coordinator:   Per change in therapy recommendations pt was screened for CIR by Loye Rumble, PT, DPT.  Note CGA for transfers with PT today and CGA for ADLs at EOB with OT previously.  He likely would progress to be able to d/c directly home at supervision/mod I level in another day or so from the acute setting, given already mobilizing without physical assist.  He does not demonstrate the need for intensive multidisciplinary therapy at this time.   Loye Rumble, PT, DPT Admissions Coordinator 07/12/23  3:40 PM

## 2023-07-12 NOTE — Plan of Care (Signed)
  Problem: Health Behavior/Discharge Planning: Goal: Ability to manage health-related needs will improve Outcome: Progressing   Problem: Clinical Measurements: Goal: Ability to maintain clinical measurements within normal limits will improve Outcome: Progressing Goal: Diagnostic test results will improve Outcome: Progressing Goal: Respiratory complications will improve Outcome: Progressing   

## 2023-07-12 NOTE — Progress Notes (Signed)
 Physical Therapy Treatment Patient Details Name: John [name] MRN: [id number] DOB: [birth year] Today's Date: 07/12/2023   History of Present Illness Pt is 63 yo male who presented to the ED for evaluation of worsening back pain and shortness of breath. pt is s/p KP L2 on  5/23. PMH significant for COPD, chronic hypoxic respiratory failure on 3 L [place], stage IV squamous cell carcinoma of the RUL on chemotherapy, cavitary lesion in the right mid lung, pneumonitis 2/2 Keytruda  on steroids, drug-induced neutropenia, chronic arthritis, paroxysmal atrial fibrillation on Eliquis , anxiety and depression.    PT Comments  Consulted with LPT to change rec to CIR. Pt showing progress.  Assisted OOB to wheelchair.  General bed mobility comments: cues for technique "log roll", self assist using bed rails as well as leg strap to guide L LE off bed.  General transfer comment: assisted with a squat pivot/lateral scoot from bed to removed arm wheelchair.  VC's on safety tech of locking breaks.  Ideally transfer to Pt's RIGHT. Good use of R LE and B UE to self perform. Pt required 4 lts oxygen during activity to achieve sats >88%.  Pt required rest breaks between each activity.  Pt highly motivated.  "Feels good to do something" stated Pt while self propelling wheelchair.  Returned to room and performed self squat pivot from wheelchair to recliner.     If plan is discharge home, recommend the following: A lot of help with walking and/or transfers;Two people to help with walking and/or transfers;Assistance with cooking/housework;Help with stairs or ramp for entrance;Assist for transportation   Can travel by private vehicle     No  Equipment Recommendations  Wheelchair (measurements PT);Wheelchair cushion (measurements PT)    Recommendations for Other Services       Precautions / Restrictions Precautions Precautions: Fall;Back Recall of Precautions/Restrictions: Impaired Precaution/Restrictions Comments: VC's for  proper tech "log roll". Restrictions Weight Bearing Restrictions Per Provider Order: No Other Position/Activity Restrictions: Left foraminal to extraforaminal disc protrusion at L3-4, potentially affecting the exiting left L3 nerve root     Mobility  Bed Mobility Overal bed mobility: Needs Assistance Bed Mobility: Rolling, Sidelying to Sit Rolling: Supervision, Used rails Sidelying to sit: Min assist       General bed mobility comments: cues for technique "log roll", self assist using bed rails as well as leg strap to guide L LE off bed    Transfers Overall transfer level: Needs assistance Equipment used: None Transfers: Bed to chair/wheelchair/BSC       Squat pivot transfers: Contact guard assist    Lateral/Scoot Transfers: Contact guard assist General transfer comment: assisted with a squat pivot/lateral scoot from bed to removed arm wheelchair.  VC's on safety tech of locking breaks.  Ideally transfer to Pt's RIGHT. Good use of R LE and B UE to self perform.    Ambulation/Gait                   Psychologist, counselling mobility: Yes Wheelchair propulsion: Both upper extremities Wheelchair parts: Supervision/cueing Distance: 45 feet x 2 limited by dyspnea and sats decreased to low 80"s with activity.   Tilt Bed    Modified Rankin (Stroke Patients Only)       Balance  Communication Communication Communication: No apparent difficulties  Cognition Arousal: Alert Behavior During Therapy: WFL for tasks assessed/performed   PT - Cognitive impairments: No apparent impairments                       PT - Cognition Comments: AxO x 3 pleasant and motivated Following commands: Intact      Cueing Cueing Techniques: Verbal cues  Exercises      General Comments        Pertinent Vitals/Pain Pain Assessment Pain Assessment:  Faces Faces Pain Scale: Hurts a little bit Pain Location: back Pain Descriptors / Indicators: Discomfort Pain Intervention(s): Monitored during session, Repositioned    Home Living                          Prior Function            PT Goals (current goals can now be found in the care plan section) Progress towards PT goals: Progressing toward goals    Frequency    Min 3X/week      PT Plan      Co-evaluation              AM-PAC PT "6 Clicks" Mobility   Outcome Measure  Help needed turning from your back to your side while in a flat bed without using bedrails?: A Little Help needed moving from lying on your back to sitting on the side of a flat bed without using bedrails?: A Little Help needed moving to and from a bed to a chair (including a wheelchair)?: A Little Help needed standing up from a chair using your arms (e.g., wheelchair or bedside chair)?: A Little Help needed to walk in hospital room?: A Little Help needed climbing 3-5 steps with a railing? : A Little 6 Click Score: 18    End of Session Equipment Utilized During Treatment: Gait belt Activity Tolerance: Patient tolerated treatment well;Patient limited by fatigue Patient left: in chair;with call bell/phone within reach;with chair alarm set;with family/visitor present Nurse Communication: Mobility status;Need for lift equipment PT Visit Diagnosis: Other abnormalities of gait and mobility (R26.89);History of falling (Z91.81);Muscle weakness (generalized) (M62.81)     Time: 1914-7829 PT Time Calculation (min) (ACUTE ONLY): 24 min  Charges:    $Therapeutic Activity: 8-22 mins $Wheel Chair Management: 8-22 mins PT General Charges $$ ACUTE PT VISIT: 1 Visit                     Bess Broody  PTA Acute  Rehabilitation Services Office M-F          [phone]

## 2023-07-12 NOTE — Progress Notes (Signed)
 Everything seems to be about the same.  He still has some weakness in the left leg.  This could certainly be secondary to spinal stenosis.  From the MRIs, there is no evidence of any malignancy in his back.  His back seems to be doing better.  The kyphoplasty does seem to be helping.  I know that he is trying.  He just is not really able to walk much because of the left leg weakness.  I thought that he might be a candidate for CIR.  Unfortunately, I am unsure that he will be able to do the necessary work that is required on the CIR.  His labs today show white cell count of 4.8.  Hemoglobin 10.3.  Platelet count 266,000.  His appetite is doing okay.  He has had no nausea or vomiting.  He is eating okay.  Again, the issue is the left leg weakness.  I do not know if he would benefit from any kind of epidural injection to see if this may help.  His vital signs show a temperature 97.9.  Pulse 95.  Blood pressure 92/70.  His lungs sound relatively clear bilaterally.  He has good air movement bilaterally.  There may be a couple wheezing.  Cardiac exam irregular rate and rhythm consistent with the atrial fibrillation.  The rate is well controlled.  Abdomen is obese but soft.  Bowel sounds are present.  He has no guarding or rebound tenderness.  Neurological exam shows no focal neurological deficit.  He does have some weakness over the left hip area.  Skin exam shows some scattered ecchymoses.   John [name] has metastatic lung cancer.  This is holding relatively steady right now.  His problem has been this osteoporotic compression fracture at L2.  He did receive kyphoplasty on 07/08/2023.  This helped his back pain.  However, there is still weakness in his left leg.  He also I think was dealing with a little bit of pneumonia.  I think he is on antibiotic for this.  I know that the staff on [address] are doing a great job with him.  Rayleen Cal, MD   Hebrews 12:12

## 2023-07-12 NOTE — Progress Notes (Signed)
  Daily Progress Note   Patient Name: John [name]       Date: 07/12/2023 DOB: Oct 04, 1960  Age: 63 y.o. MRN#: [id number] Attending Physician: Unk Garb, DO Primary Care Physician: Patient, No Pcp Per Admit Date: 07/02/2023 Length of Stay: 10 days  Reason for Consultation/Follow-up: Establishing goals of care and Pain control  Subjective:   CC:   Following up regarding complex medical decision making and symptom management.  Subjective:  Resting in bed, medication history reviewed, Dr. Birt Bulla note reviewed Objective:   Vital Signs:  BP 104/76 (BP Location: Right Arm)   Pulse 80   Temp 97.8 F (36.6 C) (Oral)   Resp 20   Ht 5\' 11"  (1.803 m)   Wt 106.1 kg   SpO2 94%   BMI 32.64 kg/m   Physical Exam: General: NAD, alert, laying in bed Cardiovascular: RRR Respiratory: no increased work of breathing noted, not in respiratory distress Neuro: A&Ox4, following commands easily Psych: Pleasant Ongoing weakness in left lower extremity  Assessment & Plan:   Assessment: [name] is a 63 y.o. 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  Palliative medicine team consulted to assist with complex medical decision making and symptom management.  Recommendations/Plan: # Complex medical decision making/goals of care:  - Patient continues discussions with providers regarding care planning.  Patient underwent kyphoplasty appropriately on 07/08/2023.  Patient looking forward to participating with physical therapy to regain strength.  Patient planning to follow-up with oncology in the  outpatient setting.  Palliative medicine team continuing to follow along and assist with conversations as able and appropriate.  -  Code Status: Full Code  # Symptom management: Patient is receiving these palliative interventions for symptom management with an intent to improve quality of life.   - Pain, severe in the setting of metastatic squamous cell carcinoma of the lung now with back pain requiring kyphoplasty Patient's pain has improved with kyphoplasty on 07/08/2023, however, patient still has weakness in left leg   - Continue fentanyl  patch at 37 mcg every 72 hour   - Continue oral Dilaudid  2 mg every 4 hours as needed   - Continue IV Dilaudid  1 mg every 4 hours as needed for breakthrough pain after oral as needed Dilaudid    - Continue Lidoderm  patch   - Gabapentin  has been discontinued.     - Constipation Patient's last bowel movement noted to be 07/10/2023.     senna  2 tabs twice daily. Can decrease if needed based on symptom response.    - Continue MiraLAX  17 g daily   - Continue bisacodyl  suppository daily as needed  # Psychosocial Support:  - Wife  # Discharge Planning: To Be Determined  - Placed outpatient palliative medicine referral on 07/08/2023 for follow-up at [facility].   Thank you for allowing the palliative care team to participate in the care John [name].  Low MDM Lujean Sake MD.  Palliative Care Provider PMT # [phone]  If patient remains symptomatic despite maximum doses, please call PMT at [phone] between 0700 and 1900. Outside of these hours, please call attending, as PMT does not have night coverage.

## 2023-07-12 NOTE — Progress Notes (Signed)
 PROGRESS NOTE    John [name]  KZS:[id number] DOB: [birth year] DOA: 07/02/2023 PCP: Patient, No Pcp Per  Subjective: Pt seen and examined. He is sitting up in bedside recliner working on his laptop. He was initially in a virtual meeting the first time I went to see him so I told him I would come back. 2nd time I went to his room, he was still in his meeting but this time he got off his business phone call so we could chat.  Pt motivated to work with PT. He understands he needs to go to SNF to be able to walk again or at least be able to stand on his own.  Then he can go home and work with home PT/OT vs outpatient PT/OT.  CM starting SNF bed search.   Hospital Course: HPI: John [name] is a 63 y.o. 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  Patient reports she has had back pain for months but the pain was too severe today to tolerate.  His home Percocet is not controlling his pain.  He describes the pain as sharp in his low back, radiating to both legs. He also reports difficulty breathing today.  On EMS arrival, patient was found to have SpO2 of 88% which improved with 6 L [place]. He reports difficulty walking due to the pain, poor appetite and feeling thirsty but denies any fevers, chills, cough, nausea, vomiting, abdominal pain, headache or dizziness.   ED Course: Initial vitals show temp 98.1, RR 19, HR 90, BP 161/101 and SpO2 95% on 6 L [place].  Labs significant for sodium 124, K+ 3.3, creatinine 0.87, WBC 1.3 (from 4.3 1-day ago), Hgb 11.3, platelet 175, mag 1.8, Phos 2.4. CXR shows right basilar consolidation  concerning for acute airway disease versus atelectasis. CTA PE study negative for PE but shows signs of possible multifocal pneumonia. Patient received IV morphine  1 mg x 2, IV ketamine  32 mg x 1, Toradol  15 mg x 1, IV NS 500 cc x 1, IV cefepime  and IV vancomycin . TRH was consulted for admission.  Significant Events: Admitted 07/02/2023 acute on chronic respiratory failure with hypoxia   Significant Labs: Covid/flu/RSV negative RVP negative Na 124, K 3.5, CO2 of 19, BUN 14, Scr 0.78, glu 105 INR 1.4 WBC 1.0, HgB 11.2, plt 205 Legionella urinary antigen Negative Strep Pneumo antigen Negative  Significant Imaging Studies: CXR Increasing right basilar consolidation superimposed upon chronic post radiation fibrosis, which may reflect acute airspace disease or atelectasis. CTPA No evidence for pulmonary embolism. 2. Stable multifocal areas of ground-glass opacity throughout both lungs along with some peripheral reticular opacities. Findings are favored as infectious/inflammatory. 3. Stable focal airspace opacity in the right perihilar region and superior segment of the right lower lobe with some central cavitation. Findings may be  infectious/inflammatory. 4. Stable 3.3 cm infrarenal abdominal aortic aneurysm. Recommend follow-up ultrasound every 3 years. Aortic Atherosclerosis (ICD10-I70.0) and Emphysema (ICD10-J43.9)  CT T-spine Chronic compression deformities from T10 through L2, with evidence of prior vertebral augmentation at T12 and L1. No acute thoracic spine fracture. Abd XR No significant formed stool in the colon. 2. Air scattered throughout nondilated small bowel, query ileus  Pelvic XR Mild bilateral femoroacetabular osteoarthritis  MRI T-spine No evidence for metastatic disease within the thoracic spine. 2. Evolving subacute compression fracture involving the superior endplate of T11 with no more than mild residual marrow edema. Height loss is not significantly changed or progressed as  compared to prior MRI from 06/06/2023. 3. Chronic compression deformities involving the T12 and L1 vertebral bodies with sequelae of prior vertebral augmentation, stable. 4. Small disc protrusions at T6-7 and T7-8 without significant stenosis. Moderate right with mild left foraminal stenosis at T10-11. 5. Post treatment changes within the visualized right lung with known right adrenal metastasis, described on recent CTs. MRI L-spine Acute compression fracture involving the inferior endplate of L2 with up to 60% height loss without significant bony retropulsion. This is superimposed on an underlying chronic L2 compression fracture, and is benign/mechanical in appearance. No evidence for metastatic disease within the lumbar spine. 2. Additional chronic compression deformities involving the T12, L1, L3, L4, and L5 vertebral bodies, otherwise stable. 3. Multifactorial degenerative changes at L4-5 with resultant moderate canal and left greater than right lateral recess stenosis, with mild to moderate bilateral L4 foraminal narrowing. 4. Left foraminal to extraforaminal disc protrusion at L3-4, potentially affecting the exiting left L3 nerve root. 5. 3.7 cm intra-abdominal aortic aneurysm. Recommend follow-up every 2 years.   Antibiotic Therapy: Anti-infectives (From admission, onward)    Start     Dose/Rate Route Frequency Ordered Stop   07/07/23 0600  ceFAZolin  (ANCEF ) IVPB 2g/100 mL premix       Note to Pharmacy: Patient is at [facility] and will transfer round-trip to [facility] for procedure.   2 g 200 mL/hr over 30 Minutes Intravenous To Radiology 07/05/23 1734 07/08/23 0600   07/03/23 0800  vancomycin  (VANCOCIN ) IVPB 1000 mg/200 mL premix  Status:  Discontinued        1,000 mg 200 mL/hr over 60 Minutes Intravenous Every 12 hours 07/02/23 2146 07/03/23 0722   07/03/23 0200  ceFEPIme  (MAXIPIME ) 2 g in sodium chloride  0.9 % 100 mL IVPB        2 g 200 mL/hr over 30 Minutes Intravenous Every 8 hours 07/02/23 2141      07/02/23 1845  vancomycin  (VANCOREADY) IVPB 2000 mg/400 mL        2,000 mg 200 mL/hr over 120 Minutes Intravenous  Once 07/02/23 1813 07/02/23 2120   07/02/23 1815  vancomycin  (VANCOCIN ) IVPB 1000 mg/200 mL premix  Status:  Discontinued        1,000 mg 200 mL/hr over 60 Minutes Intravenous  Once 07/02/23 1807 07/02/23 1813   07/02/23 1815  ceFEPIme  (MAXIPIME ) 2 g in sodium chloride  0.9 % 100 mL IVPB        2 g 200 mL/hr over 30 Minutes Intravenous  Once 07/02/23 1807 07/02/23 1911       Procedures: 07-08-2023 lumbar L2 kyphoplasty  Consultants: Palliative care Heme/onc IR    Assessment and Plan: * Acute on chronic respiratory failure with hypoxia (HCC) 07-02-2023 through 07-05-2023 Uses 3 L nasal cannula O2 at baseline.  He desaturated to 83% and required up to 15 L high flow nasal cannula.  Currently on 4 L nasal cannula O2 - MRSA PCR negative - Strep pneumo antigen negative - Legionella antigen pending - COVID, RSV, influenza, respiratory viral panel negative  - Empiric cefepime   07-06-2023 no evidence of pneumonia. Pt was on empiric cefepime  due to neutropenia. No fevers. Discussed with oncology. Will stop IV Cefepime .  07-07-2023 stable on 3 L/min. Abx  stopped on 07-06-2023.  07-08-2023 stable. On 3 L/min.  07-09-2023 stable on 3 L/min.  07-10-2023 stable.  07-11-2023 stable on supplemental O2. Currently on 5 L/min. Stable.  07-12-2023 stable on supplemental O2 3-5 L/min.  Pain of metastatic malignancy 07-06-2023 continue with IV dilaudid  prn. Has fentanyl  patch.  07-07-2023 continue with IV dilaudid  prn. Continue with fentanyl  patch.  07-08-2023 continue with IV dilaudid  prn. On fentanyl  patch scheduled and prn oral dilaudid .  07-09-2023 continue with IV dilaudid  prn. On fentanyl  patch scheduled and prn oral dilaudid .  07-10-2023 continue with IV dilaudid  prn. On fentanyl  patch scheduled and prn oral dilaudid .  05-26-205 still using IV dilaudid  prn. On  scheduled fentanyl  patch and prn oral dilaudid . Will continue IV dilaudid  prn for now.  07-12-2023 still using occ IV dilaudid  prn. Will continue with prn IV dilaudid . Continue with scheduled fentanyl  patch and po dilaudid .   Intractable back pain 07-02-2023 through 07-06-2023 due to vertebral compression fractures.  Appreciate palliative care medicine for symptom management.  Started fentanyl  patch, Dilaudid , Lidoderm  patch, Robaxin , gabapentin  - CT thoracic spine revealed chronic compression deformities T10-L2.  He did have kyphoplasty of T12 back in July 2023, and kyphoplasty L1 and L3 in August 2023 - MRI thoracic and lumbar spine revealed L2 compression fracture - IR consulted for kyphoplasty  07-06-2023 Was on schedule for have kyphoplasty tomorrow. Just notified by IR that pt's insurance company wants more information regarding his back pain. Continue with IV dilaudid  prn.  07-07-2023 still awaiting on insurance authorization. Continue with prn IV dilaudid  for pain control.  PT assessment cannot being until after his kyphoplasty.  07-08-2023 Pt and wife are expectedly upset that insurance authorization has not been obtained. I explained to both of them that providers have no control on what insurance companies authorize or reject. Pt states he called insurance company and he states insurance company claims they don't have any requests for kyphoplasty authorization. I have reached out to IR APP to make sure that request for authorization has been sent by radiology group to pt's insurance company.  Continue IV Dilaudid  prn.   07-09-2023 had L2 kyphoplasty yesterday. Continue prn IV dilaudid . Start PT.  07-10-2023 continue IV dilaudid . On po dilaudid  prn and scheduled fentanyl  patch. Continue with PT efforts. May need SNF vs home health vs CIR. Will see how he does this holiday weekend(memorial day).  07-11-2023 continue with po dilaudid  prn and scheduled fentanyl  patch. Continue IV dilaudid   prn. Still using IV dilaudid  but not as much. Pt will need SNF placement for PT. I don't think he is a candidate for CIR. Pt only able to tolerate 13 mins of PT today.  07-12-2023 still using occ IV dilaudid  prn. Will continue with prn IV dilaudid . Continue with scheduled fentanyl  patch and po dilaudid . Continue with PT. Needs SNF for PT. CM starting bed search process. He is medically stable for DC to SNF.  S/P kyphoplasty - L2 on 07-08-2023 07-09-2023 had L2 kyphoplasty yesterday. Continue prn IV dilaudid . Start PT.  07-11-2023 still using IV dilaudid  prn. On scheduled fentanyl  patch and prn oral dilaudid . Will continue IV dilaudid  prn for now.  07-12-2023 still using occ IV dilaudid  prn. Will continue with prn IV dilaudid . Continue with scheduled fentanyl  patch and po dilaudid .  Needs SNF for PT.  Obesity, Class I, BMI 30-34.9 Body mass index is 32.64 kg/m.   PAF (paroxysmal atrial fibrillation) (HCC) 07-06-2023 continue with IV heparin  gtts pending his kyphoplasty. On Toprol  XL  07-07-2023 stable.  Remains on IV heparin  gtts.  07-08-2023 continue IV heparin  gtts. Awaiting pt to complete kyphoplasty prior to restarting po Eliquis   07-09-2023 eliquis  restarted last night. Stable.  07-10-2023 stable on Eliquis .  07-11-2023 stable on Eliquis , toprol -XL 25 mg daily.  07-12-2023 stable on Eliquis  and Toprol -XL  SIADH (syndrome of inappropriate ADH production) (HCC) 07-06-2023 Na stable. Na 128. - Urine osmole 575  - Urine sodium 129 - Salt tabs and fluid restriction - Improved  07-07-2023 Na 130 today.   07-09-2023 stable. Na 131 today.  07-11-2023 Na 132. Stable.   Hyponatremia 07-06-2023 Na stable. Na 128. - Urine osmole 575  - Urine sodium 129 - Salt tabs and fluid restriction - Improved  07-07-2023 Na 130 today.  07-08-2023 stable.  07-09-2023 stable. Na 131 today.  07-11-2023 Na 132. Stable.  Stage 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 (HCC) 07-06-2023 -  Currently under active therapy with  Gemzar  and Navelbine  - Followed by Dr. Maria Shiner  07-07-2023 stable.  07-08-2023 oncology following.  07-09-2023 followed by oncology  07-10-2023 followed by oncology  07-11-2023 oncology following.  07-12-2023 stable.  DVT prophylaxis:  apixaban  (ELIQUIS ) tablet 5 mg     Code Status: Full Code Family Communication: no family at bedside. Pt is decisional. Pt states his wife works for [employer] and is back to work now. Disposition Plan: SNF Reason for continuing need for hospitalization: medically stable for several days for discharge. Awaiting SNF placement.  Objective: Vitals:   07/12/23 0351 07/12/23 1000 07/12/23 1020 07/12/23 1245  BP: 92/70   104/76  Pulse: 75   80  Resp: 20   20  Temp: 97.9 F (36.6 C)   97.8 F (36.6 C)  TempSrc: Oral   Oral  SpO2: 97% 97% 96% 94%  Weight:      Height:        Intake/Output Summary (Last 24 hours) at 07/12/2023 1338 Last data filed at 07/12/2023 0800 Gross per 24 hour  Intake 360 ml  Output 1825 ml  Net -1465 ml   Filed Weights   07/03/23 0155  Weight: 106.1 kg    Examination:  Physical Exam Vitals and nursing note reviewed.  Constitutional:      Appearance: He is obese.  HENT:     Head: Normocephalic and atraumatic.  Eyes:     General: No scleral icterus. Pulmonary:     Effort: Pulmonary effort is normal. No respiratory distress.  Abdominal:     General: There is no distension.  Skin:    Capillary Refill: Capillary refill takes less than 2 seconds.  Neurological:     Mental Status: He is alert and oriented to person, place, and time.     Data Reviewed: I have personally reviewed following labs and imaging studies  CBC: Recent Labs  Lab 07/06/23 0257 07/07/23 0359 07/08/23 0330 07/10/23 0357 07/11/23 0401 07/12/23 0336  WBC 1.3* 2.1* 3.0* 4.1 4.5 4.8  NEUTROABS 0.2* 0.6*  --   --   --   --   HGB 10.1* 10.5* 10.5* 10.9* 10.3* 10.3*  HCT 30.8* 32.0* 32.0*  34.1* 31.8* 32.0*  MCV 91.9 92.8 93.3 96.1 95.8 95.8  PLT 211 234 267 287 266 266   Basic Metabolic Panel: Recent Labs  Lab 07/06/23 0257 07/07/23 0359 07/09/23 0409 07/11/23 0401  NA 128* 130* 131* 132*  K 3.4* 4.2 4.0 4.0  CL 103 101 97* 97*  CO2 19* 22 26 26   GLUCOSE 125* 107* 113* 114*  BUN 17 14 17 18   CREATININE 0.76 0.78 0.91 0.76  CALCIUM  7.9* 8.2* 8.3* 8.2*  MG  --   --   --  2.1   GFR: Estimated Creatinine Clearance: 120.1 mL/min (by C-G formula based on SCr of 0.76 mg/dL). Coagulation Profile: Recent Labs  Lab 07/07/23 0359  INR 1.1   Recent Results (from the past 240 hours)  Culture, blood (Routine X 2) w Reflex to ID Panel     Status: None   Collection Time: 07/02/23 10:15 PM   Specimen: BLOOD RIGHT ARM  Result Value Ref Range Status   Specimen Description   Final    BLOOD RIGHT ARM Performed at [facility], [address], [place], [place] [address]    Special Requests   Final    BOTTLES DRAWN AEROBIC AND ANAEROBIC Blood Culture results may not be optimal due to an inadequate volume of blood received in culture bottles Performed at [facility], [address], [place], [place] [address]    Culture   Final    NO GROWTH 5 DAYS Performed at [facility], [address], [place], [place] [address]    Report Status 07/07/2023 FINAL  Final  Culture, blood (Routine X 2) w Reflex to ID Panel     Status: None   Collection Time: 07/02/23 10:15 PM   Specimen: BLOOD RIGHT ARM  Result Value Ref Range Status   Specimen Description   Final    BLOOD RIGHT ARM Performed at [facility], [address], [place], [place] [address]    Special Requests   Final    BOTTLES DRAWN AEROBIC AND ANAEROBIC Blood Culture results may not be optimal due to an inadequate volume of blood received in culture bottles Performed at [facility], [address] [place], [place] [address]    Culture   Final    NO GROWTH 5  DAYS Performed at [facility], [address], [place], [place] [address]    Report Status 07/07/2023 FINAL  Final  MRSA Next Gen by PCR, Nasal     Status: None   Collection Time: 07/02/23 11:57 PM   Specimen: Urine, Clean Catch; Nasal Swab  Result Value Ref Range Status   MRSA by PCR Next Gen NOT DETECTED NOT DETECTED Final    Comment: (NOTE) The GeneXpert MRSA Assay (FDA approved for NASAL specimens only), is one component of a comprehensive MRSA colonization surveillance program. It is not intended to diagnose MRSA infection nor to guide or monitor treatment for MRSA infections. Test performance is not FDA approved in patients less than 23 years old. Performed at [facility], [address] [place], [place] [address]   Resp panel by RT-PCR (RSV, Flu A&B, Covid) Anterior Nasal Swab     Status: None   Collection Time: 07/03/23  4:56 PM   Specimen: Anterior Nasal Swab  Result Value Ref Range Status   SARS Coronavirus 2 by RT PCR NEGATIVE NEGATIVE Final    Comment: (NOTE) SARS-CoV-2 target nucleic acids are NOT DETECTED.  The SARS-CoV-2 RNA is generally detectable in upper respiratory specimens during the acute phase of infection. The lowest concentration of SARS-CoV-2 viral copies this assay can detect is 138 copies/mL. A negative result does not preclude SARS-Cov-2 infection and should not be used as the sole basis for treatment or other patient management decisions. A negative result may occur with  improper specimen collection/handling, submission of  specimen other than nasopharyngeal swab, presence of viral mutation(s) within the areas targeted by this assay, and inadequate number of viral copies(<138 copies/mL). A negative result must be combined with clinical observations, patient history, and epidemiological information. The expected result is Negative.  Fact Sheet for Patients:  BloggerCourse.com  Fact Sheet for  Healthcare Providers:  SeriousBroker.it  This test is no t yet approved or cleared by the [place]  FDA and  has been authorized for detection and/or diagnosis of SARS-CoV-2 by FDA under an Emergency Use Authorization (EUA). This EUA will remain  in effect (meaning this test can be used) for the duration of the COVID-19 declaration under Section 564(b)(1) of the Act, 21 U.S.C.section 360bbb-3(b)(1), unless the authorization is terminated  or revoked sooner.       Influenza A by PCR NEGATIVE NEGATIVE Final   Influenza B by PCR NEGATIVE NEGATIVE Final    Comment: (NOTE) The Xpert Xpress SARS-CoV-2/FLU/RSV plus assay is intended as an aid in the diagnosis of influenza from Nasopharyngeal swab specimens and should not be used as a sole basis for treatment. Nasal washings and aspirates are unacceptable for Xpert Xpress SARS-CoV-2/FLU/RSV testing.  Fact Sheet for Patients: BloggerCourse.com  Fact Sheet for Healthcare Providers: SeriousBroker.it  This test is not yet approved or cleared by the [place]  FDA and has been authorized for detection and/or diagnosis of SARS-CoV-2 by FDA under an Emergency Use Authorization (EUA). This EUA will remain in effect (meaning this test can be used) for the duration of the COVID-19 declaration under Section 564(b)(1) of the Act, 21 U.S.C. section 360bbb-3(b)(1), unless the authorization is terminated or revoked.     Resp Syncytial Virus by PCR NEGATIVE NEGATIVE Final    Comment: (NOTE) Fact Sheet for Patients: BloggerCourse.com  Fact Sheet for Healthcare Providers: SeriousBroker.it  This test is not yet approved or cleared by the [place]  FDA and has been authorized for detection and/or diagnosis of SARS-CoV-2 by FDA under an Emergency Use Authorization (EUA). This EUA will remain in effect (meaning this  test can be used) for the duration of the COVID-19 declaration under Section 564(b)(1) of the Act, 21 U.S.C. section 360bbb-3(b)(1), unless the authorization is terminated or revoked.  Performed at [facility], [address], [place], [place] [address]   Respiratory (~20 pathogens) panel by PCR     Status: None   Collection Time: 07/03/23  4:56 PM   Specimen: Nasopharyngeal Swab; Respiratory  Result Value Ref Range Status   Adenovirus NOT DETECTED NOT DETECTED Final   Coronavirus 229E NOT DETECTED NOT DETECTED Final    Comment: (NOTE) The Coronavirus on the Respiratory Panel, DOES NOT test for the novel  Coronavirus (2019 nCoV)    Coronavirus HKU1 NOT DETECTED NOT DETECTED Final   Coronavirus NL63 NOT DETECTED NOT DETECTED Final   Coronavirus OC43 NOT DETECTED NOT DETECTED Final   Metapneumovirus NOT DETECTED NOT DETECTED Final   Rhinovirus / Enterovirus NOT DETECTED NOT DETECTED Final   Influenza A NOT DETECTED NOT DETECTED Final   Influenza B NOT DETECTED NOT DETECTED Final   Parainfluenza Virus 1 NOT DETECTED NOT DETECTED Final   Parainfluenza Virus 2 NOT DETECTED NOT DETECTED Final   Parainfluenza Virus 3 NOT DETECTED NOT DETECTED Final   Parainfluenza Virus 4 NOT DETECTED NOT DETECTED Final   Respiratory Syncytial Virus NOT DETECTED NOT DETECTED Final   Bordetella pertussis NOT DETECTED NOT DETECTED Final   Bordetella Parapertussis NOT DETECTED NOT DETECTED Final   Chlamydophila pneumoniae  NOT DETECTED NOT DETECTED Final   Mycoplasma pneumoniae NOT DETECTED NOT DETECTED Final    Comment: Performed at [facility], [address] [place], [place] [address]   Scheduled Meds:  amiodarone   200 mg Oral Daily   apixaban   5 mg Oral BID   budesonide -glycopyrrolate -formoterol   2 puff Inhalation BID   feeding supplement  237 mL Oral TID BM   fentaNYL   1 patch Transdermal Q72H   fentaNYL   1 patch Transdermal Q72H   lidocaine   1 patch Transdermal Q24H    metoprolol  succinate  25 mg Oral Daily   PARoxetine   10 mg Oral QHS   polyethylene glycol  17 g Oral Daily   predniSONE   5 mg Oral Q breakfast   senna-docusate  2 tablet Oral BID   sodium chloride   1 g Oral BID WC   Vitamin D  (Ergocalciferol )  50,000 Units Oral Q7 days   Continuous Infusions:   LOS: 10 days   Time spent: 50 minutes  Unk Garb, DO  [facility]  07/12/2023, 1:38 PM

## 2023-07-12 NOTE — TOC Initial Note (Signed)
 Transition of Care [facility]) - Initial/Assessment Note   Patient Details  Name: John [name] MRN: [id number] Date of Birth: 09/29/1960  Transition of Care [facility]) CM/SW Contact:    Zenon Hilda, LCSW Phone Number: 07/12/2023, 10:33 AM  Clinical Narrative: PT evaluation recommended SNF. CSW met with patient with spouse on phone to discuss recommendation. Patient and spouse expressed some hesitancy with SNF, but are agreeable to CSW faxing the referral out. Patient and wife confirmed they are still considering HH as a possibility. Patient asked about CIR. CSW explained PT/OT evaluations did not recommend inpatient rehab at this time.  FL2 done; PASRR received. Initial referral faxed out. TOC awaiting bed offers.  Expected Discharge Plan: Skilled Nursing Facility Barriers to Discharge: Continued Medical Work up  Patient Goals and CMS Choice Patient states their goals for this hospitalization and ongoing recovery are:: Go to rehab or return home CMS Medicare.gov Compare Post Acute Care list provided to:: Patient Choice offered to / list presented to : Patient  Expected Discharge Plan and Services In-house Referral: Clinical Social Work Post Acute Care Choice: Skilled Nursing Facility Living arrangements for the past 2 months: Single Family Home          DME Arranged: N/A DME Agency: NA  Prior Living Arrangements/Services Living arrangements for the past 2 months: Single Family Home Lives with:: Spouse Patient language and need for interpreter reviewed:: Yes Do you feel safe going back to the place where you live?: Yes      Need for Family Participation in Patient Care: No (Comment) Care giver support system in place?: Yes (comment) Current home services: DME (Home oxygen, walker) Criminal Activity/Legal Involvement Pertinent to Current Situation/Hospitalization: No - Comment as needed  Activities of Daily Living ADL Screening (condition at time of admission) Independently performs  ADLs?: Yes (appropriate for developmental age) Is the patient deaf or have difficulty hearing?: No Does the patient have difficulty seeing, even when wearing glasses/contacts?: No Does the patient have difficulty concentrating, remembering, or making decisions?: No  Permission Sought/Granted Permission sought to share information with : Facility Industrial/product designer granted to share information with : Yes, Verbal Permission Granted Permission granted to share info w AGENCY: SNFs  Emotional Assessment Appearance:: Appears stated age Attitude/Demeanor/Rapport: Engaged Affect (typically observed): Accepting Orientation: : Oriented to Self, Oriented to Place, Oriented to  Time, Oriented to Situation Alcohol / Substance Use: Not Applicable Psych Involvement: No (comment)  Admission diagnosis:  Pain [R52] Hypoxia [R09.02] Cancer associated pain [G89.3] Palliative care encounter [Z51.5] Acute respiratory failure with hypoxia (HCC) [J96.01] Intractable back pain [M54.9] Pain of metastatic malignancy [G89.3] Pancytopenia (HCC) [D61.818] Compression fracture of T12 vertebra, sequela [S22.080S] Squamous cell carcinoma of lung, unspecified laterality (HCC) [C34.90] Stage 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 (HCC) [C34.91] 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 [facility]) [C79.71] Patient Active Problem List   Diagnosis Date Noted   S/P kyphoplasty - L2 on 07-08-2023 07/09/2023   High risk medication use 07/08/2023   Drug-induced constipation 07/08/2023   Need for emotional support 07/07/2023   Counseling and coordination of care 07/07/2023   Squamous cell carcinoma of lung (HCC) 07/07/2023   Medication management 07/07/2023   Cancer associated pain 07/07/2023   Palliative care encounter 07/07/2023   Acute on chronic respiratory failure with hypoxia (HCC) 07/02/2023   Pain of metastatic malignancy 07/02/2023   Pancytopenia (HCC) 06/08/2023   Back pain  06/06/2023   Hyperthyroidism 03/31/2023   GERD (gastroesophageal reflux disease) 12/07/2021   Intractable  back pain 08/24/2021   T12 compression fracture (HCC) 08/23/2021   Mixed hyperlipidemia 08/02/2021   Coronary artery disease involving native coronary artery of native heart without angina pectoris 08/02/2021   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) 07/29/2021   Obesity, Class I, BMI 30-34.9 07/27/2021   Chronic respiratory failure with hypoxia (HCC) 04/14/2021   Hypercoagulable state due to persistent atrial fibrillation (HCC) 10/28/2020   Malignant neoplasm of lung (HCC)    RA (rheumatoid arthritis) (HCC) 10/06/2020   PAF (paroxysmal atrial fibrillation) (HCC) 08/25/2020   Drug-induced neutropenia (HCC) 08/11/2020   SIADH (syndrome of inappropriate ADH production) (HCC)    Hyponatremia 05/12/2020   Occlusion of right pulmonary artery (HCC) 05/12/2020   Neutropenia (HCC) 05/12/2020   Stage 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 (HCC) 04/10/2020   COPD with asthma (HCC) 03/24/2020   Chronic anxiety 11/08/2014   Petit mal seizure status (HCC) 11/08/2014   Age-related nuclear cataract of left eye 08/12/2014   PCP:  Patient, No Pcp Per Pharmacy:   [id number] [place], [place] - [address] AT [facility] [address] & [facility] [address] [place] [address] Phone: [phone] Fax: [phone]  [facility] [address] [place] [address] Phone: [phone] Fax: [phone]  Social Drivers of Health (SDOH) Social History: SDOH Screenings   Food Insecurity: No Food Insecurity (07/02/2023)  Housing: Low Risk  (07/02/2023)  Transportation Needs: No Transportation Needs (07/02/2023)  Utilities: Not At Risk (07/02/2023)  Depression (PHQ2-9): Low Risk  (04/12/2023)  Social Connections: Moderately Integrated (03/16/2022)  Tobacco Use: Medium Risk (07/03/2023)   SDOH Interventions:     Readmission Risk Interventions    07/03/2023   10:12 AM 06/07/2023    1:47 PM 08/06/2021    2:26 PM  Readmission Risk Prevention Plan  Transportation Screening Complete Complete Complete  PCP or Specialist Appt within 3-5 Days  Complete   HRI or Home Care Consult  Complete   Social Work Consult for Recovery Care Planning/Counseling  Complete   Palliative Care Screening  Not Applicable   Medication Review Oceanographer) Complete Complete   PCP or Specialist appointment within 3-5 days of discharge Complete    HRI or Home Care Consult Not Complete  Complete  HRI or Home Care Consult Pt Refusal Comments N/A    SW Recovery Care/Counseling Consult Complete  Complete  Palliative Care Screening Complete    Skilled Nursing Facility Not Applicable  Not Applicable

## 2023-07-12 NOTE — Progress Notes (Signed)
 Removed 12 mcg Fent patch from L back and wasted with RN. Second RN came into room because unable to find 2nd 25 mcg patch. Both RNs unable to find patch. L Lowman witness Charity fundraiser.

## 2023-07-13 DIAGNOSIS — J9621 Acute and chronic respiratory failure with hypoxia: Secondary | ICD-10-CM | POA: Diagnosis not present

## 2023-07-13 LAB — CBC
HCT: 34.2 % — ABNORMAL LOW (ref 39.0–52.0)
Hemoglobin: 10.8 g/dL — ABNORMAL LOW (ref 13.0–17.0)
MCH: 30.5 pg (ref 26.0–34.0)
MCHC: 31.6 g/dL (ref 30.0–36.0)
MCV: 96.6 fL (ref 80.0–100.0)
Platelets: 238 10*3/uL (ref 150–400)
RBC: 3.54 MIL/uL — ABNORMAL LOW (ref 4.22–5.81)
RDW: 18.6 % — ABNORMAL HIGH (ref 11.5–15.5)
WBC: 5.1 10*3/uL (ref 4.0–10.5)
nRBC: 0 % (ref 0.0–0.2)

## 2023-07-13 LAB — ALBUMIN: Albumin: 3.2 g/dL — ABNORMAL LOW (ref 3.5–5.0)

## 2023-07-13 MED ORDER — PREDNISONE 10 MG PO TABS: 10.0000 mg | ORAL_TABLET | Freq: Every day | ORAL | Status: DC

## 2023-07-13 MED ORDER — PREDNISONE 5 MG PO TABS: 5.0000 mg | ORAL_TABLET | Freq: Every day | ORAL | Status: DC

## 2023-07-13 MED ORDER — ACETAMINOPHEN 325 MG PO TABS: 650.0000 mg | ORAL_TABLET | Freq: Four times a day (QID) | ORAL | Status: DC | PRN

## 2023-07-13 MED ORDER — HYDROMORPHONE HCL 2 MG PO TABS
3.0000 mg | ORAL_TABLET | ORAL | Status: DC | PRN
  Administered 2023-07-13 – 2023-07-20 (×13): 3 mg via ORAL
  Filled 2023-07-13 (×13): qty 2

## 2023-07-13 MED ORDER — ACETAMINOPHEN 500 MG PO TABS
1000.0000 mg | ORAL_TABLET | Freq: Three times a day (TID) | ORAL | Status: AC
  Administered 2023-07-13: 1000 mg via ORAL
  Filled 2023-07-13 (×2): qty 2

## 2023-07-13 MED ORDER — PREDNISONE 20 MG PO TABS
40.0000 mg | ORAL_TABLET | Freq: Every day | ORAL | Status: AC
  Administered 2023-07-13 – 2023-07-17 (×5): 40 mg via ORAL
  Filled 2023-07-13 (×5): qty 2

## 2023-07-13 MED ORDER — PANTOPRAZOLE SODIUM 40 MG PO TBEC
40.0000 mg | DELAYED_RELEASE_TABLET | Freq: Every day | ORAL | Status: DC
  Administered 2023-07-13 – 2023-07-20 (×8): 40 mg via ORAL
  Filled 2023-07-13 (×8): qty 1

## 2023-07-13 MED ORDER — PREDNISONE 20 MG PO TABS
20.0000 mg | ORAL_TABLET | Freq: Every day | ORAL | Status: DC
  Administered 2023-07-20: 20 mg via ORAL
  Filled 2023-07-13: qty 1

## 2023-07-13 MED ORDER — PREDNISONE 20 MG PO TABS
30.0000 mg | ORAL_TABLET | Freq: Every day | ORAL | Status: AC
  Administered 2023-07-18 – 2023-07-19 (×2): 30 mg via ORAL
  Filled 2023-07-13 (×2): qty 1

## 2023-07-13 NOTE — Progress Notes (Signed)
 PROGRESS NOTE    John [name]  ZOX:[id number] DOB: 15-Jul-1960 DOA: 07/02/2023 PCP: Patient, No Pcp Per  Chief Complaint  Patient presents with   Back Pain   Leg Pain    Brief Narrative:    63 y.o. 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  Patient reports she has had back pain for months but the pain was too severe today to tolerate.  His home Percocet is not controlling his pain.  He describes the pain as sharp in his low back, radiating to both legs. He also reports difficulty breathing today.  On EMS arrival, patient was found to have SpO2 of 88% which improved with 6 L [place]. He reports difficulty walking due to the pain, poor appetite and feeling thirsty but denies any fevers, chills, cough, nausea, vomiting, abdominal pain, headache or dizziness.   ED Course: Initial vitals show temp 98.1, RR 19, HR 90, BP 161/101 and SpO2 95% on 6 L [place].  Labs significant for sodium 124, K+ 3.3, creatinine 0.87, WBC 1.3 (from 4.3 1-day ago), Hgb 11.3, platelet 175, mag 1.8, Phos 2.4. CXR shows right basilar consolidation concerning for acute airway disease versus atelectasis. CTA PE study negative for PE but shows signs of possible multifocal pneumonia. Patient received IV morphine  1 mg x 2, IV ketamine  32 mg x 1, Toradol  15 mg x 1, IV NS 500 cc x 1, IV cefepime  and IV vancomycin . TRH was consulted for admission.  Assessment & Plan:   Principal Problem:   Acute on chronic respiratory failure with hypoxia [facility]) Active Problems:   Intractable back pain   Pain of metastatic malignancy   S/P kyphoplasty - L2 on 07-08-2023    Stage 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 (HCC)   Hyponatremia   SIADH (syndrome of inappropriate ADH production) (HCC)   PAF (paroxysmal atrial fibrillation) (HCC)   Obesity, Class I, BMI 30-34.9   Need for emotional support   Counseling and coordination of care   Squamous cell carcinoma of lung (HCC)   Medication management   Cancer associated pain   Palliative care encounter   High risk medication use   Drug-induced constipation  Intractable Back Pain Left Lower Extremity Pain Compression Fracture of L2  Chronic compression Deformities (T12, L1, L3, L4, and L5 Vertebral Bodies)  S/p L2 kyphoplasty on 5/23 MRI T/L spine showed acute compression fracture involving the inferior endplate of L2.  Also chronic compression deformities of T12, L1, L3, L4, and L5.  Multifactorial degenerative changes L4-5 with resultant moderate canal and left greater than right lateral recess stenosis with mild to moderate bilatera L4 foraminal narrowing.  L foraminal to extraforaminal disc protrusion at L3-4 potentially affecting the exiting L L3 nerve root. His main pain at this time is in the LLE, ? If this is radicular (related to disc protrusion) Will trial steroids  Schedule APAP. Fentanyl  patch.  Dilaudid  IV and PO prn.  Lidocaine  patch.  Robaxin .   PT/OT Workup additionally as indicated  Acute on Chronic Hypoxic Respiratory Failure Uses 3 L at baseline Required up to 15 L HFNC during this hospitalization  CT PE protocol without PE, multifocal areas of  GGO throughout both lungs along with peripheral reticular opacities (infectious/inflammatory).  Stable focal airspace opacity in R perihilar region and sperior segment of the RLL with central cavitation.   Cefepime  5/17-5/21 Negative covid, flu, RSV.  Negative urine strep, negative urine legionella, negative MRSA PCR.  Stage IV Squamous Cell Carcinoma of R Lung Following with Dr. Maria [name] on gemzar  and navelbine   Atrial  Fibrillation Eliquis  Metop  SIADH Stable on salt tabs and fluid restriction  Obesity Body mass index is 32.64 kg/m.     DVT prophylaxis: eliquis  Code Status: full Family Communication: none Disposition:   Status is: Inpatient Remains inpatient appropriate because: need for ongoing inpatient care   Consultants:  Oncology palliative  Procedures:  L2 kyphoplasty 5/23  Antimicrobials:  Anti-infectives (From admission, onward)    Start     Dose/Rate Route Frequency Ordered Stop   07/07/23 0600  ceFAZolin  (ANCEF ) IVPB 2g/100 mL premix       Note to Pharmacy: Patient is at [facility] and will transfer round-trip to [facility] for procedure.   2 g 200 mL/hr over 30 Minutes Intravenous To Radiology 07/05/23 1734 07/08/23 0753   07/03/23 0800  vancomycin  (VANCOCIN ) IVPB 1000 mg/200 mL premix  Status:  Discontinued        1,000 mg 200 mL/hr over 60 Minutes Intravenous Every 12 hours 07/02/23 2146 07/03/23 0722   07/03/23 0200  ceFEPIme  (MAXIPIME ) 2 g in sodium chloride  0.9 % 100 mL IVPB  Status:  Discontinued        2 g 200 mL/hr over 30 Minutes Intravenous Every 8 hours 07/02/23 2141 07/06/23 1454   07/02/23 1845  vancomycin  (VANCOREADY) IVPB 2000 mg/400 mL        2,000 mg 200 mL/hr over 120 Minutes Intravenous  Once 07/02/23 1813 07/02/23 2120   07/02/23 1815  vancomycin  (VANCOCIN ) IVPB 1000 mg/200 mL premix  Status:  Discontinued        1,000 mg 200 mL/hr over 60 Minutes Intravenous  Once 07/02/23 1807 07/02/23 1813   07/02/23 1815  ceFEPIme  (MAXIPIME ) 2 g in sodium chloride  0.9 % 100 mL IVPB        2 g 200 mL/hr over 30 Minutes Intravenous  Once 07/02/23 1807 07/02/23 1911       Subjective: Pain is ok right now, he's John [name] little loopy though  Objective: Vitals:   07/12/23 1957 07/13/23 0616 07/13/23 0742 07/13/23 1259  BP: 99/68 106/71  (!) 106/59  Pulse: 80 84  75  Resp: 20 20  20   Temp: 98.2 F (36.8 C) 98.2 F (36.8 C)  97.9 F (36.6 C)  TempSrc: Oral Oral  Oral   SpO2: 95% 94% 93% 97%  Weight:      Height:        Intake/Output Summary (Last 24 hours) at 07/13/2023 1951 Last data filed at 07/13/2023 1833 Gross per 24 hour  Intake 120 ml  Output 1950 ml  Net -1830 ml   Filed Weights   07/03/23 0155  Weight: 106.1 kg    Examination:  General exam: Appears calm and comfortable  Respiratory system: unlabored Cardiovascular system: RRR Gastrointestinal system: Abdomen is nondistended, soft and nontender.  Central nervous system: LLE weakness Extremities: no LEE    Data Reviewed: I have personally reviewed following labs and imaging studies  CBC: Recent Labs  Lab 07/07/23 0359 07/08/23 0330 07/10/23 0357 07/11/23 0401 07/12/23 0336 07/13/23 0400  WBC 2.1* 3.0* 4.1 4.5 4.8 5.1  NEUTROABS 0.6*  --   --   --   --   --  HGB 10.5* 10.5* 10.9* 10.3* 10.3* 10.8*  HCT 32.0* 32.0* 34.1* 31.8* 32.0* 34.2*  MCV 92.8 93.3 96.1 95.8 95.8 96.6  PLT 234 267 287 266 266 238    Basic Metabolic Panel: Recent Labs  Lab 07/07/23 0359 07/09/23 0409 07/11/23 0401  NA 130* 131* 132*  K 4.2 4.0 4.0  CL 101 97* 97*  CO2 22 26 26   GLUCOSE 107* 113* 114*  BUN 14 17 18   CREATININE 0.78 0.91 0.76  CALCIUM  8.2* 8.3* 8.2*  MG  --   --  2.1    GFR: Estimated Creatinine Clearance: 120.1 mL/min (by C-G formula based on SCr of 0.76 mg/dL).  Liver Function Tests: No results for input(s): "AST", "ALT", "ALKPHOS", "BILITOT", "PROT", "ALBUMIN " in the last 168 hours.  CBG: No results for input(s): "GLUCAP" in the last 168 hours.   No results found for this or any previous visit (from the past 240 hours).       Radiology Studies: No results found.      Scheduled Meds:  amiodarone   200 mg Oral Daily   apixaban   5 mg Oral BID   budesonide -glycopyrrolate -formoterol   2 puff Inhalation BID   feeding supplement  237 mL Oral TID BM   fentaNYL   1 patch Transdermal Q72H   fentaNYL   1 patch Transdermal Q72H   lidocaine   1 patch  Transdermal Q24H   metoprolol  succinate  25 mg Oral Daily   pantoprazole   40 mg Oral Daily   PARoxetine   10 mg Oral QHS   polyethylene glycol  17 g Oral Daily   predniSONE   40 mg Oral Q breakfast   Followed by   Cecily Cohen ON 07/18/2023] predniSONE   30 mg Oral Q breakfast   Followed by   Cecily Cohen ON 07/20/2023] predniSONE   20 mg Oral Q breakfast   Followed by   Cecily Cohen ON 07/22/2023] predniSONE   10 mg Oral Q breakfast   Followed by   Cecily Cohen ON 07/24/2023] predniSONE   5 mg Oral Q breakfast   senna-docusate  2 tablet Oral BID   sodium chloride   1 g Oral BID WC   Vitamin D  (Ergocalciferol )  50,000 Units Oral Q7 days   Continuous Infusions:   LOS: 11 days    Time spent: over 30 min    Donnetta Gains, MD [facility]   To contact the attending provider between 7A-7P or the covering provider during after hours 7P-7A, please log into the web site [email or web address] and access using universal [place] password for that web site. If you do not have the password, please call the hospital operator.  07/13/2023, 7:51 PM

## 2023-07-13 NOTE — TOC Progression Note (Signed)
 Transition of Care [facility]) - Progression Note   Patient Details  Name: John [name] MRN: [id number] Date of Birth: 10-Feb-1961  Transition of Care [facility]) CM/SW Contact  Zenon Hilda, LCSW Phone Number: 07/13/2023, 11:27 AM  Clinical Narrative: CSW provided patient with the following bed offers:  [facility] [address] [place], [place] [address] [phone] Overall rating? Much below average  [facility] [address] [place], [place] [address] [phone] Overall rating ? Much below average  Patient reported he will review the offers with his wife, but due to Medicare star ratings he is considering going home with [facility] and DME (hospital bed and manual wheelchair). Patient to discuss SNFs with wife. TOC to follow.  Expected Discharge Plan: Skilled Nursing Facility Barriers to Discharge: Continued Medical Work up  Expected Discharge Plan and Services In-house Referral: Clinical Social Work Post Acute Care Choice: Skilled Nursing Facility Living arrangements for the past 2 months: Single Family Home             DME Arranged: N/A DME Agency: NA  Social Determinants of Health (SDOH) Interventions SDOH Screenings   Food Insecurity: No Food Insecurity (07/02/2023)  Housing: Low Risk  (07/02/2023)  Transportation Needs: No Transportation Needs (07/02/2023)  Utilities: Not At Risk (07/02/2023)  Depression (PHQ2-9): Low Risk  (04/12/2023)  Social Connections: Moderately Integrated (03/16/2022)  Tobacco Use: Medium Risk (07/03/2023)   Readmission Risk Interventions    07/03/2023   10:12 AM 06/07/2023    1:47 PM 08/06/2021    2:26 PM  Readmission Risk Prevention Plan  Transportation Screening Complete Complete Complete  PCP or Specialist Appt within 3-5 Days  Complete   HRI or Home Care Consult  Complete   Social Work Consult for Recovery Care Planning/Counseling  Complete   Palliative Care Screening  Not Applicable   Medication Review Special educational needs teacher) Complete Complete   PCP or Specialist appointment within 3-5 days of discharge Complete    HRI or Home Care Consult Not Complete  Complete  HRI or Home Care Consult Pt Refusal Comments N/A    SW Recovery Care/Counseling Consult Complete  Complete  Palliative Care Screening Complete    Skilled Nursing Facility Not Applicable  Not Applicable

## 2023-07-13 NOTE — Progress Notes (Signed)
 He still having some problems with the left hip.  I wish there is some way to try to help the left hip.  There is still some weakness there.  Again, I wonder if this is some of that might be related to arthritis in his back.  Maybe, physical therapy will be able to help with strengthening that left leg.  He is getting around in a wheelchair.  He is able to transfer himself from bed to wheelchair and back.  It sounds like he may need to go to some kind of rehab facility.  Looks there may have been some confusion this morning.  I know that the nurses are doing a very good job trying to help with the pain.  I talked him about this.  I know that he was frustrated.  Again, I think this was just a matter of maybe misunderstanding.  Overall, he has had very good pain control.  Maybe, we can increase the oral Dilaudid  to 3 mg.  I know that palliative care is following and I will know if they may have any other suggestions.  He is going to the bathroom.  He is eating well.  He has had no nausea or vomiting.  His labs this morning show white count 5.1.  Hemoglobin 10.8.  The platelet count is 238,000.  He denies any kind of shortness of breath.  He is on some supplemental oxygen.  He has had no fever.  There has been no obvious bleeding.  He is on anticoagulation.   Vital signs show temperature 98.2.  Pulse 84.  Blood pressure 106/71.  His lungs sound pretty clear bilaterally.  He has good air movement bilaterally.  There may be a little bit of decrease over on the right base.  His cardiac exam is regular rate and rhythm.  I really do not hear any atrial fibrillation.  Abdomen soft.  Bowel sounds are present.  He has no fluid wave.  He has no palpable liver or spleen tip.  Extremities she has good strength in the right leg.  Again he has some weakness in the left hip.  There is no swelling.  Neurological exam shows no focal deficits outside of the weakness in the left hip.  Hopefully, with continued  physical therapy, there will be improved strength in the left hip.  Again, I do not know if this might benefit from some kind of steroid injection.  It sounds like there might be a neurological issue with respect to spinal stenosis, possibly.  I do appreciate everybody's help.  I know the staff on [address] are doing a great job with him.    Rayleen Cal, MD  Hebrews 12:12

## 2023-07-13 NOTE — Progress Notes (Signed)
 Patient expressing dissatisfaction with pain regimen. Last night I gave the patient oral dilaudid  per [facility] for pain continuing after previous shift left. Patient slept until after 0400 this AM. Patient called out for more pain medication. Patient brought oral dilaudid , told patient I would come back in about 30 minutes to check on their pain. Upon entering the room the patient is agitated and doesn't feel as if I have adequately treated pain, even though I had IV dilaudid  and patient refused taking it for this RN. I attempted to educate the patient that the order is written that I must give the oral medication prior to giving the IV dose for breakthrough. The patient continued to express dissatisfaction. I explained that I must follow the doctors orders when administering medications and cannot just ignore the instructions, but reinforced that I had IV pain medication I could give to him, patient refused. This RN sat at bedside for greater than 5 minutes listening to concerns. Charge RN called to room to educate patient and allow concerns to be voiced, while ensuring needs were being met.

## 2023-07-13 NOTE — Progress Notes (Signed)
 Occupational Therapy Treatment Patient Details Name: John [name] MRN: [id number] DOB: [birth year] Today's Date: 07/13/2023   History of present illness Pt is 63 yo male who presented to the ED for evaluation of worsening back pain and shortness of breath. pt is s/p KP L2 on  5/23. 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   OT comments  Patient was noted to be able to engage in transfers squat pivot with less physical A during session. Patient was educated on AE for LB dressing/bathing tasks with patient verbalized understanding.Patient will benefit from continued inpatient follow up therapy, <3 hours/day.  Patient's discharge plan remains appropriate at this time. OT will continue to follow acutely.        If plan is discharge home, recommend the following:  A little help with bathing/dressing/bathroom;A little help with walking and/or transfers;Help with stairs or ramp for entrance;A lot of help with bathing/dressing/bathroom;A lot of help with walking and/or transfers   Equipment Recommendations  None recommended by OT       Precautions / Restrictions Precautions Precautions: Fall;Back Recall of Precautions/Restrictions: Impaired Precaution/Restrictions Comments: VC's for proper tech "log roll". Restrictions Weight Bearing Restrictions Per Provider Order: No Other Position/Activity Restrictions: Left foraminal to extraforaminal disc protrusion at L3-4, potentially affecting the exiting left L3 nerve root       Mobility Bed Mobility Overal bed mobility: Needs Assistance           Sit to sidelying: Min assist General bed mobility comments: min A to get BLE back into bed.          Balance Overall  balance assessment: Needs assistance Sitting-balance support: Single extremity supported, Feet supported, No upper extremity supported Sitting balance-Leahy Scale: Fair           ADL either performed or assessed with clinical judgement   ADL Overall ADL's : Needs assistance/impaired     Lower Body Dressing Details (indicate cue type and reason): patient reported he was unable to figure four either leg. AE bag retrieved only for patient to figure four leg while attempting to get sock aid over R foot. patient was educated that this was used to prevent need to reach feet. patient verbalized understanding reporting that he is unable to reach his LLE like that but then noted to pull it up onto lap with shakiness noted of LLE. patient again educated on how AE was useful to prevent need to use BUE to maintain figure four position and possible discomfort at back. patient verbalized understanding, Toilet Transfer: Moderate assistance Toilet Transfer Details (indicate cue type and reason): scoot transfer to R side from reclienr to w/c and w/c to bed with increased time.                  Cognition Arousal: Alert Behavior During Therapy: WFL for tasks assessed/performed Cognition: No apparent impairments             OT - Cognition Comments: noted to joke around more during this session.                                        Pertinent Vitals/ Pain  Pain Assessment Pain Assessment: Faces Faces Pain Scale: Hurts little more Pain Location: back Pain Descriptors / Indicators: Discomfort, Constant, Grimacing         Frequency  Min 2X/week        Progress Toward Goals  OT Goals(current goals can now be found in the care plan section)  Progress towards OT goals: Progressing toward goals     Plan         AM-PAC OT "6 Clicks" Daily Activity     Outcome Measure   Help from another person eating meals?: None Help from another person taking care of  personal grooming?: A Little Help from another person toileting, which includes using toliet, bedpan, or urinal?: A Lot Help from another person bathing (including washing, rinsing, drying)?: A Lot Help from another person to put on and taking off regular upper body clothing?: A Little Help from another person to put on and taking off regular lower body clothing?: A Lot 6 Click Score: 16    End of Session Equipment Utilized During Treatment: Oxygen;Gait belt;Other (comment) (w/c)  OT Visit Diagnosis: Unsteadiness on feet (R26.81);Muscle weakness (generalized) (M62.81);Pain   Activity Tolerance Patient limited by pain   Patient Left in bed;with call bell/phone within reach (case manager in room)   Nurse Communication          Time: 4098-1191 OT Time Calculation (min): 25 min  Charges: OT General Charges $OT Visit: 1 Visit OT Treatments $Self Care/Home Management : 23-37 mins  Wynette Heckler, MS Acute Rehabilitation Department Office# [phone]   Jame Maze 07/13/2023, 2:20 PM

## 2023-07-13 NOTE — Plan of Care (Signed)
   Problem: Health Behavior/Discharge Planning: Goal: Ability to manage health-related needs will improve Outcome: Progressing   Problem: Clinical Measurements: Goal: Ability to maintain clinical measurements within normal limits will improve Outcome: Progressing Goal: Will remain free from infection Outcome: Progressing

## 2023-07-14 DIAGNOSIS — J9621 Acute and chronic respiratory failure with hypoxia: Secondary | ICD-10-CM | POA: Diagnosis not present

## 2023-07-14 LAB — COMPREHENSIVE METABOLIC PANEL WITH GFR
ALT: 35 U/L (ref 0–44)
AST: 27 U/L (ref 15–41)
Albumin: 3.3 g/dL — ABNORMAL LOW (ref 3.5–5.0)
Alkaline Phosphatase: 78 U/L (ref 38–126)
Anion gap: 8 (ref 5–15)
BUN: 16 mg/dL (ref 8–23)
CO2: 25 mmol/L (ref 22–32)
Calcium: 8.5 mg/dL — ABNORMAL LOW (ref 8.9–10.3)
Chloride: 99 mmol/L (ref 98–111)
Creatinine, Ser: 0.63 mg/dL (ref 0.61–1.24)
GFR, Estimated: >60 mL/min (ref >60)
Glucose, Bld: 108 mg/dL — ABNORMAL HIGH (ref 70–99)
Potassium: 3.7 mmol/L (ref 3.5–5.1)
Sodium: 132 mmol/L — ABNORMAL LOW (ref 135–145)
Total Bilirubin: 1 mg/dL (ref 0.0–1.2)
Total Protein: 5.8 g/dL — ABNORMAL LOW (ref 6.5–8.1)

## 2023-07-14 LAB — CBC WITH DIFFERENTIAL/PLATELET
Abs Immature Granulocytes: 0.04 10*3/uL (ref 0.00–0.07)
Basophils Absolute: 0.1 10*3/uL (ref 0.0–0.1)
Basophils Relative: 1 %
Eosinophils Absolute: 0 10*3/uL (ref 0.0–0.5)
Eosinophils Relative: 0 %
HCT: 33 % — ABNORMAL LOW (ref 39.0–52.0)
Hemoglobin: 10.6 g/dL — ABNORMAL LOW (ref 13.0–17.0)
Immature Granulocytes: 1 %
Lymphocytes Relative: 8 %
Lymphs Abs: 0.6 10*3/uL — ABNORMAL LOW (ref 0.7–4.0)
MCH: 31.1 pg (ref 26.0–34.0)
MCHC: 32.1 g/dL (ref 30.0–36.0)
MCV: 96.8 fL (ref 80.0–100.0)
Monocytes Absolute: 0.8 10*3/uL (ref 0.1–1.0)
Monocytes Relative: 10 %
Neutro Abs: 6.1 10*3/uL (ref 1.7–7.7)
Neutrophils Relative %: 80 %
Platelets: 248 10*3/uL (ref 150–400)
RBC: 3.41 MIL/uL — ABNORMAL LOW (ref 4.22–5.81)
RDW: 18.3 % — ABNORMAL HIGH (ref 11.5–15.5)
WBC: 7.5 10*3/uL (ref 4.0–10.5)
nRBC: 0 % (ref 0.0–0.2)

## 2023-07-14 LAB — PHOSPHORUS: Phosphorus: 3.3 mg/dL (ref 2.5–4.6)

## 2023-07-14 LAB — MAGNESIUM: Magnesium: 2.1 mg/dL (ref 1.7–2.4)

## 2023-07-14 NOTE — Plan of Care (Signed)
   Problem: Health Behavior/Discharge Planning: Goal: Ability to manage health-related needs will improve Outcome: Progressing

## 2023-07-14 NOTE — Progress Notes (Addendum)
 Physical Therapy Treatment Patient Details Name: John [name] MRN: [id number] DOB: 23-Dec-1960 Today's Date: 07/14/2023   History of Present Illness Pt is 63 yo male who presented to the ED for evaluation of worsening back pain and shortness of breath. pt is s/p KP L2 on  5/23. 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    PT Comments  Pt states pain is well controlled also demonstrates ability to actively flex L knee in side lying yet continues to struggle with knee extension due to quad weakness. Hopefull strength will improve as pt has been compliant with HEP. Pt received on 3L O2 with resting SpO2 95%, quickly desating to 86-88% upon transferring to w/c. Pt increased to 4L O2 during w/c mobility in hall. Repeated cues to stop and rest due to DOE. Pt completed 250ft with Supervision, increased fatigue requiring O2 to be increased to 6L briefly to attain 90%.  Pt assisted back to bed and returned to 3L O2 with SpO2 in mid 90's. Wheelchair measurements given to TOC. Pt has a hospital bed, RW, and BSC at home. Continue PT per POC.     Addendum:                 Ideally, pt would be more beneficial d/c'ing to CIR prior to returning home since  he was previously independently functioning and working remotely. Currently he is slowly regaining B LE strength and LBP has improved, yet he is reliant on a wheelchair for safe mobility. CIR would be the best option to allow him to safely progress strength and return to standing level of function. Initial recommendation for CIR at d/c remains appropriate.  If plan is discharge home, recommend the following: A lot of help with walking and/or transfers;Two people to help with walking and/or  transfers;Assistance with cooking/housework;Help with stairs or ramp for entrance;Assist for transportation   Can travel by private vehicle     No  Equipment Recommendations  Wheelchair (measurements PT);Wheelchair cushion (measurements PT);Other (comment) (18" w/c and cushion, elevated leg rests, swing aware arm rests, HHPT)    Recommendations for Other Services       Precautions / Restrictions Precautions Precautions: Fall;Back Precaution Booklet Issued: Yes (comment) (Pt given handout reference and educated on fall and back precautions) Recall of Precautions/Restrictions: Intact Precaution/Restrictions Comments: VC's for proper tech "log roll". Restrictions Weight Bearing Restrictions Per Provider Order: No Other Position/Activity Restrictions: Left foraminal to extraforaminal disc protrusion at L3-4, potentially affecting the exiting left L3 nerve root     Mobility  Bed Mobility Overal bed mobility: Needs Assistance Bed Mobility: Rolling, Sidelying to Sit Rolling: Supervision, Used rails Sidelying to sit: Supervision, Used rails (heavy reliance on side rail. Pt has a hospital bed at home)     Sit to sidelying: Min assist, Used rails, Contact guard assist (for B LE's) General bed mobility comments:  (Pt with good demonstration of log roll)    Transfers Overall transfer level: Needs assistance Equipment used: None Transfers: Bed to chair/wheelchair/BSC Sit to Stand: Contact guard assist     Squat pivot transfers: Contact guard assist    Lateral/Scoot Transfers: Contact guard assist General transfer comment: Pt required CGA for safety bed<>chair moving towards Right side    Ambulation/Gait  General Gait Details: High fall risk for buckling   Psychologist, counselling mobility: Yes Wheelchair propulsion: Both upper extremities, Right lower extremity Wheelchair parts: Needs  assistance Distance: 200 Wheelchair Assistance Details (indicate cue type and reason): Unable to actively assist with L LE, cues to initiate brakes prior to transferring from w/c and benefits of frequent rest breaks   Tilt Bed    Modified Rankin (Stroke Patients Only)       Balance Overall balance assessment: Needs assistance Sitting-balance support: Single extremity supported, Feet supported Sitting balance-Leahy Scale: Fair Sitting balance - Comments: Pt tolerated 5 minutes sitting EOB   Standing balance support: Reliant on assistive device for balance, During functional activity Standing balance-Leahy Scale: Zero Standing balance comment: reliant on UEs and external support;  anterior lean                            Communication Communication Communication: No apparent difficulties  Cognition Arousal: Alert Behavior During Therapy: WFL for tasks assessed/performed   PT - Cognitive impairments: No apparent impairments                       PT - Cognition Comments: AxO x 3 pleasant and motivated Following commands: Intact      Cueing Cueing Techniques: Verbal cues  Exercises General Exercises - Lower Extremity Ankle Circles/Pumps: AROM, Both, 20 reps Quad Sets: AROM, Both, 10 reps Other Exercises Other Exercises:  (Pt measured for an 18" w/c and cushion with elevating leg rests and swing aware arm rests, TOC notified)    General Comments General comments (skin integrity, edema, etc.): Pt educated on proper DME needs, safe transition home, back and fall precautions      Pertinent Vitals/Pain Pain Assessment Pain Assessment: 0-10 Pain Score: 4  Pain Location: back Pain Descriptors / Indicators: Discomfort Pain Intervention(s): Monitored during session    Home Living                          Prior Function            PT Goals (current goals can now be found in the care plan section) Acute Rehab PT Goals Patient Stated Goal:  to get stronger    Frequency    Min 3X/week      PT Plan      Co-evaluation              AM-PAC PT "6 Clicks" Mobility   Outcome Measure  Help needed turning from your back to your side while in a flat bed without using bedrails?: A Little Help needed moving from lying on your back to sitting on the side of a flat bed without using bedrails?: A Little Help needed moving to and from a bed to a chair (including a wheelchair)?: A Little Help needed standing up from a chair using your arms (e.g., wheelchair or bedside chair)?: A Lot Help needed to walk in hospital room?: Total Help needed climbing 3-5 steps with a railing? : Total 6 Click Score: 13    End of Session Equipment Utilized During Treatment: Oxygen (3-6 Liters O2) Activity Tolerance: Patient tolerated treatment well Patient left: in bed;with call bell/phone within reach;with bed alarm set Nurse Communication: Mobility status;Other (comment) (DME) PT Visit Diagnosis: Other abnormalities of gait and mobility (R26.89);History of  falling (Z91.81);Muscle weakness (generalized) (M62.81)     Time: 6578-4696 PT Time Calculation (min) (ACUTE ONLY): 44 min  Charges:    $Therapeutic Exercise: 8-22 mins $Therapeutic Activity: 8-22 mins $Wheel Chair Management: 8-22 mins PT General Charges $$ ACUTE PT VISIT: 1 Visit                    Melvyn Stagers, PTA  Diona Franklin 07/14/2023, 1:54 PM

## 2023-07-14 NOTE — Progress Notes (Signed)
  Daily Progress Note   Patient Name: John [name]       Date: 07/14/2023 DOB: 03-31-1960  Age: 63 y.o. MRN#: [id number] Attending Physician: Etter Hermann., * Primary Care Physician: Patient, No Pcp Per Admit Date: 07/02/2023 Length of Stay: 12 days  Reason for Consultation/Follow-up: Establishing goals of care and Pain control  Subjective:   CC:   Following up regarding complex medical decision making and symptom management.  Subjective:  Resting in bed, medication history reviewed, Dr. Birt Bulla note reviewed, discussed with [facility] colleague. Objective:   Vital Signs:  BP (!) 114/94 (BP Location: Right Arm)   Pulse 75   Temp 97.8 F (36.6 C)   Resp 20   Ht 5\' 11"  (1.803 m)   Wt 106.1 kg   SpO2 98%   BMI 32.64 kg/m   Physical Exam: General: NAD, alert, laying in bed Cardiovascular: RRR Respiratory: no increased work of breathing noted, not in respiratory distress Neuro: A&Ox4, following commands easily Psych: Pleasant Ongoing weakness in left lower extremity  Assessment & Plan:   Assessment: John [name] is a 63 y.o. 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  Palliative medicine team consulted to assist with complex medical decision making and symptom management.  Recommendations/Plan: # Complex medical decision making/goals of care:  - Patient continues discussions with providers regarding care planning.  Patient underwent kyphoplasty appropriately on 07/08/2023.  Patient looking forward to participating with physical therapy to regain strength.  Patient planning  to follow-up with oncology in the outpatient setting.  Palliative medicine team continuing to follow along and assist with conversations as able and appropriate.  -  Code Status: Full Code  # Symptom management: Patient is receiving these palliative interventions for symptom management with an intent to improve quality of life.   - Pain, severe in the setting of metastatic squamous cell carcinoma of the lung now with back pain requiring kyphoplasty Patient's pain has improved with kyphoplasty on 07/08/2023, however, patient still has weakness in left leg: Agree with the addition of steroids for radicular component of left lower extremity pain and weakness.   - Continue fentanyl  patch    - Continue oral Dilaudid  2 mg every 4 hours as needed   - Continue Lidoderm  patch   - Gabapentin  has been discontinued.     - Constipation Patient's last bowel movement noted to be 07/10/2023.     senna  2 tabs twice daily. Can decrease if needed based on symptom response.    - Continue MiraLAX  17 g daily   - Continue bisacodyl  suppository daily as needed  # Psychosocial Support:  - Wife  # Discharge Planning: To Be Determined  - Placed outpatient palliative medicine referral on 07/08/2023 for follow-up at [facility].   Thank you for allowing the palliative care team to participate in the care John [name].  Low MDM Lujean Sake MD.  Palliative Care Provider PMT # [phone]  If patient remains symptomatic despite maximum doses, please call PMT at [phone] between 0700 and 1900. Outside of these hours, please call attending, as PMT does not have night coverage.

## 2023-07-14 NOTE — Progress Notes (Addendum)
 PROGRESS NOTE    John [name]  IRJ:[id number] DOB: [birth year] DOA: 07/02/2023 PCP: Patient, No Pcp Per  Chief Complaint  Patient presents with   Back Pain   Leg Pain    Brief Narrative:    63 y.o. 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    He reported back pain for months, but worsened before admission.  He was walking without assistive device before this hospitalization.  Madison Direnzo few days before the hospitalization, he noticed left leg weakness and pain which has progressed.    He was admitted for intractable back pain, found to have an acute compression fracture of L2.  Now s/p kyphoplasty.  He continues to have left lower extremity weakness.   Assessment & Plan:   Principal Problem:   Acute on chronic respiratory failure with hypoxia [facility]) Active Problems:   Intractable back pain   Pain of metastatic malignancy   S/P kyphoplasty - L2 on 08-03-2023   Stage 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 (HCC)   Hyponatremia   SIADH (syndrome of inappropriate ADH production) (HCC)   PAF (paroxysmal atrial fibrillation) (HCC)   Obesity, Class I, BMI 30-34.9   Need for emotional support   Counseling and coordination of care   Squamous cell carcinoma of lung (HCC)   Medication management   Cancer associated pain   Palliative care encounter   High risk medication use   Drug-induced constipation  Intractable Back Pain Left Lower Extremity Pain  Left Lower Extremity Weakness Compression Fracture of L2  Chronic compression Deformities (T12, L1, L3, L4, and L5 Vertebral Bodies)  S/p L2 kyphoplasty on  Aug 03, 2023 MRI T/L spine showed acute compression fracture involving the inferior endplate of L2.  Also chronic compression deformities of T12, L1, L3, L4, and L5.  Multifactorial degenerative changes L4-5 with resultant moderate canal and left greater than right lateral recess stenosis with mild to moderate bilatera L4 foraminal narrowing.  L foraminal to extraforaminal disc protrusion at L3-4 potentially affecting the exiting L L3 nerve root. His main pain at this time is in the LLE, ? If this is radicular (related to disc protrusion) Also has significant LLE weakness associated with this - will review imaging with nsgy (no recommendations for any surgical intervention per discussion with nsgy on call).  I think prognosis for LLE weakness is probably poor for improvement.   Schedule APAP. Fentanyl  patch.  Dilaudid  IV and PO prn.  Lidocaine  patch.  Robaxin .  Steroid taper.   PT/OT - declined by CIR (see 5/27 note from Loye Rumble PT)) Workup additionally as indicated  Acute on Chronic Hypoxic Respiratory Failure Uses 3 L at baseline Required up to 15 L HFNC during this hospitalization  CT PE protocol without PE, multifocal areas of GGO throughout both lungs along with peripheral reticular opacities (infectious/inflammatory).  Stable focal airspace opacity in R perihilar region and sperior segment of the RLL with central cavitation.   Cefepime  5/17-5/21 Negative covid, flu, RSV.  Negative urine strep, negative urine legionella, negative MRSA PCR. He's currently on home o2  Stage IV Squamous Cell Carcinoma of R Lung Following with Dr. Maria Shiner on gemzar  and navelbine   Atrial Fibrillation  Eliquis  Metop  SIADH Stable on salt tabs and fluid restriction  Obesity Body mass index is 32.64 kg/m.    DVT prophylaxis: eliquis  Code Status: full Family Communication: none Disposition:   Status is: Inpatient Remains inpatient appropriate because: need for ongoing inpatient care   Consultants:   Oncology palliative  Procedures:  L2 kyphoplasty 5/23  Antimicrobials:  Anti-infectives (From admission, onward)    Start     Dose/Rate Route Frequency Ordered Stop   07/07/23 0600  ceFAZolin  (ANCEF ) IVPB 2g/100 mL premix       Note to Pharmacy: Patient is at [facility] and will transfer round-trip to [facility] for procedure.   2 g 200 mL/hr over 30 Minutes Intravenous To Radiology 07/05/23 1734 07/08/23 0753   07/03/23 0800  vancomycin  (VANCOCIN ) IVPB 1000 mg/200 mL premix  Status:  Discontinued        1,000 mg 200 mL/hr over 60 Minutes Intravenous Every 12 hours 07/02/23 2146 07/03/23 0722   07/03/23 0200  ceFEPIme  (MAXIPIME ) 2 g in sodium chloride  0.9 % 100 mL IVPB  Status:  Discontinued        2 g 200 mL/hr over 30 Minutes Intravenous Every 8 hours 07/02/23 2141 07/06/23 1454   07/02/23 1845  vancomycin  (VANCOREADY) IVPB 2000 mg/400 mL        2,000 mg 200 mL/hr over 120 Minutes Intravenous  Once 07/02/23 1813 07/02/23 2120   07/02/23 1815  vancomycin  (VANCOCIN ) IVPB 1000 mg/200 mL premix  Status:  Discontinued        1,000 mg 200 mL/hr over 60 Minutes Intravenous  Once 07/02/23 1807 07/02/23 1813   07/02/23 1815  ceFEPIme  (MAXIPIME ) 2 g in sodium chloride  0.9 % 100 mL IVPB        2 g 200 mL/hr over 30 Minutes Intravenous  Once 07/02/23 1807 07/02/23 1911       Subjective: Steroids are helping with the pain, last time he used PO dilaudid  was last night around 2 am  Objective: Vitals:   07/13/23 2033 07/14/23 0314 07/14/23 0758 07/14/23 1348  BP: 105/77 107/72  (!) 114/94  Pulse: 76 78  75  Resp: 20 20  20   Temp: 98.3 F (36.8 C) 98.5 F (36.9 C)  97.8 F (36.6 C)  TempSrc: Oral Oral    SpO2: 97% 94% 92% 98%  Weight:      Height:        Intake/Output Summary (Last 24 hours) at 07/14/2023 1426 Last data filed at 07/14/2023 1118 Gross per 24 hour  Intake --  Output 1400 ml  Net -1400 ml   Filed Weights   07/03/23 0155  Weight: 106.1 kg    Examination:  General: No  acute distress. Cardiovascular: RRR Lungs: unlabored Neurological: continued LLE weakness (greater than R) Skin: Warm and dry. No rashes or lesions. Extremities: No clubbing or cyanosis. No edema.   Data Reviewed: I have personally reviewed following labs and imaging studies  CBC: Recent Labs  Lab 07/10/23 0357 07/11/23 0401 07/12/23 0336 07/13/23 0400 07/14/23 0353  WBC 4.1 4.5 4.8 5.1 7.5  NEUTROABS  --   --   --   --  6.1  HGB 10.9* 10.3* 10.3* 10.8* 10.6*  HCT 34.1* 31.8* 32.0* 34.2* 33.0*  MCV 96.1 95.8 95.8 96.6 96.8  PLT 287 266 266 238 248    Basic Metabolic Panel: Recent Labs  Lab 07/09/23 0409 07/11/23 0401 07/14/23 0353  NA 131* 132* 132*  K 4.0 4.0 3.7  CL 97* 97*  99  CO2 26 26 25   GLUCOSE 113* 114* 108*  BUN 17 18 16   CREATININE 0.91 0.76 0.63  CALCIUM  8.3* 8.2* 8.5*  MG  --  2.1 2.1  PHOS  --   --  3.3    GFR: Estimated Creatinine Clearance: 120.1 mL/min (by C-G formula based on SCr of 0.63 mg/dL).  Liver Function Tests: Recent Labs  Lab 07/13/23 2047 07/14/23 0353  AST  --  27  ALT  --  35  ALKPHOS  --  78  BILITOT  --  1.0  PROT  --  5.8*  ALBUMIN  3.2* 3.3*    CBG: No results for input(s): "GLUCAP" in the last 168 hours.   No results found for this or any previous visit (from the past 240 hours).       Radiology Studies: No results found.      Scheduled Meds:  acetaminophen   1,000 mg Oral Q8H   amiodarone   200 mg Oral Daily   apixaban   5 mg Oral BID   budesonide -glycopyrrolate -formoterol   2 puff Inhalation BID   feeding supplement  237 mL Oral TID BM   fentaNYL   1 patch Transdermal Q72H   fentaNYL   1 patch Transdermal Q72H   lidocaine   1 patch Transdermal Q24H   metoprolol  succinate  25 mg Oral Daily   pantoprazole   40 mg Oral Daily   PARoxetine   10 mg Oral QHS   polyethylene glycol  17 g Oral Daily   predniSONE   40 mg Oral Q breakfast   Followed by   Cecily Cohen ON 07/18/2023] predniSONE   30 mg Oral Q breakfast    Followed by   Cecily Cohen ON 07/20/2023] predniSONE   20 mg Oral Q breakfast   Followed by   Cecily Cohen ON 07/22/2023] predniSONE   10 mg Oral Q breakfast   Followed by   Cecily Cohen ON 07/24/2023] predniSONE   5 mg Oral Q breakfast   senna-docusate  2 tablet Oral BID   sodium chloride   1 g Oral BID WC   Vitamin D  (Ergocalciferol )  50,000 Units Oral Q7 days   Continuous Infusions:   LOS: 12 days    Time spent: over 30 min    Donnetta Gains, MD [facility]   To contact the attending provider between 7A-7P or the covering provider during after hours 7P-7A, please log into the web site [email or web address] and access using universal [place] password for that web site. If you do not have the password, please call the hospital operator.  07/14/2023, 2:26 PM

## 2023-07-14 NOTE — Plan of Care (Signed)
  Problem: Health Behavior/Discharge Planning: Goal: Ability to manage health-related needs will improve Outcome: Progressing   Problem: Activity: Goal: Risk for activity intolerance will decrease Outcome: Progressing   Problem: Nutrition: Goal: Adequate nutrition will be maintained Outcome: Progressing   

## 2023-07-14 NOTE — Progress Notes (Signed)
 Looks like he might begin a little bit better.  There might be little bit more strength in the left leg.  He is on some prednisone  right now.  It looks like he probably will not be going to a skilled nursing facility.  He might be going home.  He says pain wise he is doing okay right now.  His labs show sodium 132.  Potassium 3.7.  BUNs 16 creatinine 0.63.  Calcium  8.5 with an albumin  of 3.3.  His white cell count 7.5.  Hemoglobin 10.6.  Platelet count 248,000.  He is eating okay.  There is no nausea or vomiting.  He is going to the bathroom.  He has not complained of any chest pain.  There is no shortness of breath.   His vital signs are temperature of 98.5.  Pulse 78.  Blood pressure 107/72.  His lungs sound relatively clear bilaterally.  He may have little bit decreased over on the right base.  He has no wheezing.  Cardiac exam regular rate and rhythm.  I do not hear any obvious atrial fibrillation.  His abdomen is soft.  Bowel sounds are present.  He has no fluid wave.  There is no palpable liver or spleen tip.  Extremities shows no clubbing, cyanosis or edema.  He has better strength in the left hip.  Neurological exam is nonfocal.  Again, it looks like Mr. Funez will be going home.  It does not look like the available skilled nursing facilities are well rated and this troublesome.  I know that PT and OT are working with him quite a bit.  I do appreciate all the great care is getting from everybody on 4 W.   Rayleen Cal, MD  [place] 12:12

## 2023-07-14 NOTE — TOC Progression Note (Addendum)
 Transition of Care [facility]) - Progression Note   Patient Details  Name: John [name] MRN: [id number] Date of Birth: 1960/03/25  Transition of Care [facility]) CM/SW Contact  Zenon Hilda, LCSW Phone Number: 07/14/2023, 2:10 PM  Clinical Narrative: CSW met with patient to follow up on SNF bed offers and patient reported he is refusing [facility] because "they killed my mother in-law" and will not go to [facility] as it has a 1 star rating. Patient reported he prefers to go home with [facility] and a hospital bed and manual wheelchair. Patient agreeable to [facility] referral being sent to [facility] due to insurance being in-network and DME referral to [facility]. CSW made [facility] referral to [facility] [place] with [facility], which is under review. [facility] able to provide HHPT/OT, but does not have an aide. CSW made DME referral to [facility] with [facility], which was accepted. TOC to complete DME narrative once orders are placed.  Addendum: CSW notified patient is requested that SNF bed search be expanded and requested that the referral also be sent to [facility] in [place]. CSW explained repeatedly to patient and wife that while the bed search can be expanded, the main issue is the patient's insurance as he does not have Medicare. Patient currently has 17 bed denials. CSW sent out additional referrals and left a voicemail for Ms. Dickens in admissions at [facility] requesting that the referral be reviewed.  Expected Discharge Plan: Home w Home Health Services Barriers to Discharge: Continued Medical Work up  Expected Discharge Plan and Services In-house Referral: Clinical Social Work Post Acute Care Choice: Skilled Nursing Facility Living arrangements for the past 2 months: Single Family Home            DME Arranged: Wheelchair manual, Hospital bed DME Agency: [facility] Date DME Agency Contacted: 07/14/23 Time DME Agency Contacted: 1135 Representative spoke with at DME Agency: [facility] HH Arranged: PT, OT HH Agency: [facility] Date [facility] Agency Contacted: 07/14/23 Time HH Agency Contacted: 1148 Representative spoke with at [facility] Agency: Louanna Rouse  Social Determinants of Health (SDOH) Interventions SDOH Screenings   Food Insecurity: No Food Insecurity (07/02/2023)  Housing: Low Risk  (07/02/2023)  Transportation Needs: No Transportation Needs (07/02/2023)  Utilities: Not At Risk (07/02/2023)  Depression (PHQ2-9): Low Risk  (04/12/2023)  Social Connections: Moderately Integrated (03/16/2022)  Tobacco Use: Medium Risk (07/03/2023)   Readmission Risk Interventions    07/03/2023   10:12 AM 06/07/2023    1:47 PM 08/06/2021    2:26 PM  Readmission Risk Prevention Plan  Transportation Screening Complete Complete Complete  PCP or Specialist Appt within 3-5 Days  Complete   HRI or Home Care Consult  Complete   Social Work Consult for Recovery Care Planning/Counseling  Complete   Palliative Care Screening  Not Applicable   Medication Review [employer]) Complete Complete   PCP or Specialist appointment within 3-5 days of discharge Complete    HRI or Home Care Consult Not Complete  Complete  HRI or Home Care Consult Pt Refusal Comments N/A    SW Recovery Care/Counseling Consult Complete  Complete  Palliative Care Screening Complete    Skilled Nursing Facility Not Applicable  Not Applicable

## 2023-07-14 NOTE — Care Management (Signed)
 Patient suffers from stage 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, intractable back pain, and pain of metastatic malignancy, which impairs their ability to perform daily activities like toileting, dressing, and grooming in the home. A rolling walker or cane will not resolve issue with performing activities of daily living. A wheelchair will allow patient to safely perform daily activities. Patient can safely propel the wheelchair in the home or has a caregiver who can provide assistance.  Patient has stage 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, intractable back pain, and pain of metastatic malignancy which requires patient's head and chest to be positioned in ways not feasible with a normal bed. Head must be elevated at least 30 or more patient requires frequent and immediate changes in body position which cannot be achieved with a normal bed.     Durable Medical Equipment  (From admission, onward)           Start     Ordered   07/14/23 1441  For home use only DME standard manual wheelchair with seat cushion  Once       Comments: Patient suffers from compression fractures and left leg weakness which impairs their ability to perform daily activities like bathing, dressing, feeding, grooming, and toileting in the home.  A cane, crutch, or walker will not resolve issue with performing activities of daily living. A wheelchair will allow patient to safely perform daily activities. Patient can safely propel the wheelchair in the home or has a caregiver who can provide assistance. Length of need Lifetime. Accessories: elevating leg rests (ELRs), wheel locks, extensions and anti-tippers.   18 inch chair, elevated leg rest, swing back arm rests, 18 inch cushion   07/14/23 1441   07/14/23 1419  For home use only DME Hospital bed  Once       Question Answer Comment  Length of Need Lifetime   The above medical condition requires: Patient requires the ability to reposition frequently   Bed type  Semi-electric      07/14/23 1419

## 2023-07-15 ENCOUNTER — Inpatient Hospital Stay

## 2023-07-15 ENCOUNTER — Inpatient Hospital Stay: Admitting: Family

## 2023-07-15 ENCOUNTER — Encounter: Payer: Self-pay | Admitting: Hematology & Oncology

## 2023-07-15 DIAGNOSIS — J9621 Acute and chronic respiratory failure with hypoxia: Secondary | ICD-10-CM | POA: Diagnosis not present

## 2023-07-15 LAB — CBC WITH DIFFERENTIAL/PLATELET
Abs Immature Granulocytes: 0.05 10*3/uL (ref 0.00–0.07)
Basophils Absolute: 0.1 10*3/uL (ref 0.0–0.1)
Basophils Relative: 1 %
Eosinophils Absolute: 0 10*3/uL (ref 0.0–0.5)
Eosinophils Relative: 0 %
HCT: 34.9 % — ABNORMAL LOW (ref 39.0–52.0)
Hemoglobin: 11 g/dL — ABNORMAL LOW (ref 13.0–17.0)
Immature Granulocytes: 1 %
Lymphocytes Relative: 7 %
Lymphs Abs: 0.5 10*3/uL — ABNORMAL LOW (ref 0.7–4.0)
MCH: 30.8 pg (ref 26.0–34.0)
MCHC: 31.5 g/dL (ref 30.0–36.0)
MCV: 97.8 fL (ref 80.0–100.0)
Monocytes Absolute: 0.6 10*3/uL (ref 0.1–1.0)
Monocytes Relative: 9 %
Neutro Abs: 5.8 10*3/uL (ref 1.7–7.7)
Neutrophils Relative %: 82 %
Platelets: 248 10*3/uL (ref 150–400)
RBC: 3.57 MIL/uL — ABNORMAL LOW (ref 4.22–5.81)
RDW: 18.6 % — ABNORMAL HIGH (ref 11.5–15.5)
WBC: 7.1 10*3/uL (ref 4.0–10.5)
nRBC: 0 % (ref 0.0–0.2)

## 2023-07-15 LAB — COMPREHENSIVE METABOLIC PANEL WITH GFR
ALT: 34 U/L (ref 0–44)
AST: 24 U/L (ref 15–41)
Albumin: 3.2 g/dL — ABNORMAL LOW (ref 3.5–5.0)
Alkaline Phosphatase: 73 U/L (ref 38–126)
Anion gap: 6 (ref 5–15)
BUN: 16 mg/dL (ref 8–23)
CO2: 25 mmol/L (ref 22–32)
Calcium: 8.3 mg/dL — ABNORMAL LOW (ref 8.9–10.3)
Chloride: 103 mmol/L (ref 98–111)
Creatinine, Ser: 0.76 mg/dL (ref 0.61–1.24)
GFR, Estimated: >60 mL/min (ref >60)
Glucose, Bld: 89 mg/dL (ref 70–99)
Potassium: 3.6 mmol/L (ref 3.5–5.1)
Sodium: 134 mmol/L — ABNORMAL LOW (ref 135–145)
Total Bilirubin: 0.9 mg/dL (ref 0.0–1.2)
Total Protein: 5.9 g/dL — ABNORMAL LOW (ref 6.5–8.1)

## 2023-07-15 LAB — MAGNESIUM: Magnesium: 2.1 mg/dL (ref 1.7–2.4)

## 2023-07-15 LAB — PHOSPHORUS: Phosphorus: 3.3 mg/dL (ref 2.5–4.6)

## 2023-07-15 NOTE — Progress Notes (Signed)
 PROGRESS NOTE    John [name]  XBM:[id number] DOB: 1960-10-05 DOA: 07/02/2023 PCP: Patient, No Pcp Per  Chief Complaint  Patient presents with   Back Pain   Leg Pain    Brief Narrative:    63 y.o. 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    He reported back pain for months, but worsened before admission.  He was walking without assistive device before this hospitalization.  Ahmyah Gidley few days before the hospitalization, he noticed left leg weakness and pain which has progressed.    He was admitted for intractable back pain, found to have an acute compression fracture of L2.  Now s/p kyphoplasty.  He continues to have left lower extremity weakness.   Assessment & Plan:   Principal Problem:   Acute on chronic respiratory failure with hypoxia [facility]) Active Problems:   Intractable back pain   Pain of metastatic malignancy   S/P kyphoplasty - L2 on 07/13/23   Stage 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 (HCC)   Hyponatremia   SIADH (syndrome of inappropriate ADH production) (HCC)   PAF (paroxysmal atrial fibrillation) (HCC)   Obesity, Class I, BMI 30-34.9   Need for emotional support   Counseling and coordination of care   Squamous cell carcinoma of lung (HCC)   Medication management   Cancer associated pain   Palliative care encounter   High risk medication use   Drug-induced constipation  Intractable Back Pain Left Lower Extremity Pain  Left Lower Extremity Weakness Compression Fracture of L2  Chronic compression Deformities (T12, L1, L3, L4, and L5 Vertebral Bodies)  S/p L2 kyphoplasty on  Jul 13, 2023 MRI T/L spine showed acute compression fracture involving the inferior endplate of L2.  Also chronic compression deformities of T12, L1, L3, L4, and L5.  Multifactorial degenerative changes L4-5 with resultant moderate canal and left greater than right lateral recess stenosis with mild to moderate bilatera L4 foraminal narrowing.  L foraminal to extraforaminal disc protrusion at L3-4 potentially affecting the exiting L L3 nerve root. His main pain at this time is in the LLE, ? If this is radicular (related to disc protrusion vs stenosis) Also has significant LLE weakness associated with this - discussed with nsgy (no recommendations for any surgical intervention per discussion with nsgy on call).  He's improving with steroids, will monitor.    Schedule APAP. Fentanyl  patch.  Dilaudid  IV and PO prn.  Lidocaine  patch.  Robaxin .  Steroid taper.   PT/OT - declined by CIR (see 5/27 note from Loye Rumble PT)) Workup additionally as indicated  Acute on Chronic Hypoxic Respiratory Failure Uses 3 L at baseline Required up to 15 L HFNC during this hospitalization  CT PE protocol without PE, multifocal areas of GGO throughout both lungs along with peripheral reticular opacities (infectious/inflammatory).  Stable focal airspace opacity in R perihilar region and sperior segment of the RLL with central cavitation.   Cefepime  5/17-5/21 Negative covid, flu, RSV.  Negative urine strep, negative urine legionella, negative MRSA PCR. He's currently on home o2  Stage IV Squamous Cell Carcinoma of R Lung Following with Dr. Maria Shiner on gemzar  and navelbine   Atrial Fibrillation Eliquis  Metop  SIADH  Stable on salt tabs and fluid restriction  Obesity Body mass index is 32.64 kg/m.    DVT prophylaxis: eliquis  Code Status: full Family Communication: none Disposition:   Status is: Inpatient Remains inpatient appropriate because: need for ongoing inpatient care   Consultants:   Oncology palliative  Procedures:  L2 kyphoplasty 5/23  Antimicrobials:  Anti-infectives (From admission, onward)    Start     Dose/Rate Route Frequency Ordered Stop   07/07/23 0600  ceFAZolin  (ANCEF ) IVPB 2g/100 mL premix       Note to Pharmacy: Patient is at [facility] and will transfer round-trip to [facility] for procedure.   2 g 200 mL/hr over 30 Minutes Intravenous To Radiology 07/05/23 1734 07/08/23 0753   07/03/23 0800  vancomycin  (VANCOCIN ) IVPB 1000 mg/200 mL premix  Status:  Discontinued        1,000 mg 200 mL/hr over 60 Minutes Intravenous Every 12 hours 07/02/23 2146 07/03/23 0722   07/03/23 0200  ceFEPIme  (MAXIPIME ) 2 g in sodium chloride  0.9 % 100 mL IVPB  Status:  Discontinued        2 g 200 mL/hr over 30 Minutes Intravenous Every 8 hours 07/02/23 2141 07/06/23 1454   07/02/23 1845  vancomycin  (VANCOREADY) IVPB 2000 mg/400 mL        2,000 mg 200 mL/hr over 120 Minutes Intravenous  Once 07/02/23 1813 07/02/23 2120   07/02/23 1815  vancomycin  (VANCOCIN ) IVPB 1000 mg/200 mL premix  Status:  Discontinued        1,000 mg 200 mL/hr over 60 Minutes Intravenous  Once 07/02/23 1807 07/02/23 1813   07/02/23 1815  ceFEPIme  (MAXIPIME ) 2 g in sodium chloride  0.9 % 100 mL IVPB        2 g 200 mL/hr over 30 Minutes Intravenous  Once 07/02/23 1807 07/02/23 1911       Subjective: Feeling better Eager for CIR if possible  Objective: Vitals:   07/15/23 1015 07/15/23 1102 07/15/23 1107 07/15/23 1429  BP: 90/66 100/82  101/72  Pulse: (!) 104 98  83  Resp:    18  Temp:    97.9 F (36.6 C)  TempSrc:    Oral  SpO2:   (P) 90% 94%  Weight:      Height:        Intake/Output Summary (Last 24 hours) at 07/15/2023 1528 Last data filed at 07/15/2023 0845 Gross per 24 hour  Intake 480 ml  Output 1050 ml  Net -570 ml   Filed Weights   07/03/23 0155  Weight: 106.1 kg    Examination:  General: No acute distress. Cardiovascular: RRR Lungs: unlabored Neurological: Alert and oriented  3. LLE weakness, but improving. Extremities: No clubbing or cyanosis. No edema.   Data Reviewed: I have personally reviewed following labs and imaging studies  CBC: Recent Labs  Lab 07/11/23 0401 07/12/23 0336 07/13/23 0400 07/14/23 0353 07/15/23 0341  WBC 4.5 4.8 5.1 7.5 7.1  NEUTROABS  --   --   --  6.1 5.8  HGB 10.3* 10.3* 10.8* 10.6* 11.0*  HCT 31.8* 32.0* 34.2* 33.0* 34.9*  MCV 95.8 95.8 96.6 96.8 97.8  PLT 266 266 238 248 248    Basic Metabolic Panel: Recent Labs  Lab 07/09/23 0409 07/11/23 0401 07/14/23 0353 07/15/23 0341  NA 131* 132* 132* 134*  K 4.0 4.0 3.7 3.6  CL 97* 97* 99 103  CO2 26 26 25 25   GLUCOSE 113* 114* 108* 89  BUN 17 18 16 16   CREATININE 0.91  0.76 0.63 0.76  CALCIUM  8.3* 8.2* 8.5* 8.3*  MG  --  2.1 2.1 2.1  PHOS  --   --  3.3 3.3    GFR: Estimated Creatinine Clearance: 120.1 mL/min (by C-G formula based on SCr of 0.76 mg/dL).  Liver Function Tests: Recent Labs  Lab 07/13/23 2047 07/14/23 0353 07/15/23 0341  AST  --  27 24  ALT  --  35 34  ALKPHOS  --  78 73  BILITOT  --  1.0 0.9  PROT  --  5.8* 5.9*  ALBUMIN  3.2* 3.3* 3.2*    CBG: No results for input(s): "GLUCAP" in the last 168 hours.   No results found for this or any previous visit (from the past 240 hours).       Radiology Studies: No results found.      Scheduled Meds:  acetaminophen   1,000 mg Oral Q8H   amiodarone   200 mg Oral Daily   apixaban   5 mg Oral BID   budesonide -glycopyrrolate -formoterol   2 puff Inhalation BID   feeding supplement  237 mL Oral TID BM   fentaNYL   1 patch Transdermal Q72H   fentaNYL   1 patch Transdermal Q72H   lidocaine   1 patch Transdermal Q24H   metoprolol  succinate  25 mg Oral Daily   pantoprazole   40 mg Oral Daily   PARoxetine   10 mg Oral QHS   polyethylene glycol  17 g Oral Daily   predniSONE   40 mg Oral Q breakfast   Followed by   Cecily Cohen ON 07/18/2023] predniSONE   30 mg Oral Q breakfast   Followed by   Cecily Cohen ON  07/20/2023] predniSONE   20 mg Oral Q breakfast   Followed by   Cecily Cohen ON 07/22/2023] predniSONE   10 mg Oral Q breakfast   Followed by   Cecily Cohen ON 07/24/2023] predniSONE   5 mg Oral Q breakfast   senna-docusate  2 tablet Oral BID   sodium chloride   1 g Oral BID WC   Vitamin D  (Ergocalciferol )  50,000 Units Oral Q7 days   Continuous Infusions:   LOS: 13 days    Time spent: over 30 min    Donnetta Gains, MD [facility]   To contact the attending provider between 7A-7P or the covering provider during after hours 7P-7A, please log into the web site [email or web address] and access using universal [place] password for that web site. If you do not have the password, please call the hospital operator.  07/15/2023, 3:28 PM

## 2023-07-15 NOTE — Plan of Care (Signed)

## 2023-07-15 NOTE — Progress Notes (Signed)
 Looks like [name] may be discharged today.  I think he has several questions where he will be calling.  I know there is some been some difficulties in trying to find a place for him.  I know that physical therapy has been working with him.  I think in their note they mention him possibly going to CIR.  Again I think this would be a great idea if you would qualify.  It would be nice if they would be able to assess him to see if he would be a candidate for CIR.  His appetite is doing okay.  He has had no nausea or vomiting.  He is going to the bathroom.  The strength in his left leg continues to improve.  His labs do not look all that bad.  His blood counts continue to improve nicely.  His white cell count 7.1.  Hemoglobin 11.  Platelet count 248,000.  He says he does not have any problems with shortness of breath.  He has little bit of a cough.  His vital signs are all stable.  Blood pressure 96/70.  His pulse is 67.  Temperature 98.5.  His lungs sound clear bilaterally.  Cardiac exam regular rate and rhythm.  Abdomen is soft.  Bowel sounds are present.  There is no fluid wave.  There is no palpable liver or spleen tip.  Extremities shows improved strength in the left hip.  He has maybe a little bit of edema in the legs bilaterally.  Neurological exam shows no focal neurological deficits.   John [name] came in with a compression fracture.  He underwent kyphoplasty for this.  Is been about a week that he had this.  This did seem to help quite a bit.  Again, he needs a lot of physical therapy to have more independence.  I know the physical therapy has been working with him.  Again, it would be nice to see if he would qualify for CIR.  He has a metastatic lung cancer.  For right now, we are just holding on his chemotherapy until he improves his overall performance status.  I know that he has had great care for everybody upon 4 W.  I do appreciate everybody's help.  John Cal, MD  [place]  12:12

## 2023-07-15 NOTE — TOC Progression Note (Signed)
 Transition of Care [facility]) - Progression Note   Patient Details  Name: John [name] MRN: [id number] Date of Birth: 1960-08-14  Transition of Care [facility]) CM/SW Contact  Zenon Hilda, LCSW Phone Number: 07/15/2023, 12:19 PM  Clinical Narrative: CSW left another voicemail for Ms. Dickens in admissions at [facility]. Patient has received over 25 denials for SNF. Patient only additional bed offer is:  [facility] [address] [place], [place] [address] [phone] Overall rating ? Much below average  CSW followed up with CIR to see if patient can be reviewed again.  Expected Discharge Plan: Home w Home Health Services Barriers to Discharge: Continued Medical Work up  Expected Discharge Plan and Services In-house Referral: Clinical Social Work Post Acute Care Choice: Skilled Nursing Facility Living arrangements for the past 2 months: Single Family Home           DME Arranged: Wheelchair manual, Hospital bed DME Agency: [employer] Date DME Agency Contacted: 07/14/23 Time DME Agency Contacted: 1135 Representative spoke with at DME Agency: Zack HH Arranged: PT, OT HH Agency: [facility] Date [facility] Agency Contacted: 07/14/23 Time HH Agency Contacted: 1148 Representative spoke with at [facility] Agency: Louanna Rouse  Social Determinants of Health (SDOH) Interventions SDOH Screenings   Food Insecurity: No Food Insecurity (07/02/2023)  Housing: Low Risk  (07/02/2023)  Transportation Needs: No Transportation Needs (07/02/2023)  Utilities: Not At Risk (07/02/2023)  Depression (PHQ2-9): Low Risk  (04/12/2023)  Social Connections: Moderately Integrated (03/16/2022)  Tobacco Use: Medium Risk (07/03/2023)   Readmission Risk Interventions    07/03/2023   10:12 AM 06/07/2023    1:47 PM 08/06/2021    2:26 PM  Readmission Risk Prevention Plan  Transportation Screening Complete Complete Complete  PCP or Specialist Appt within 3-5 Days  Complete    HRI or Home Care Consult  Complete   Social Work Consult for Recovery Care Planning/Counseling  Complete   Palliative Care Screening  Not Applicable   Medication Review [employer]) Complete Complete   PCP or Specialist appointment within 3-5 days of discharge Complete    HRI or Home Care Consult Not Complete  Complete  HRI or Home Care Consult Pt Refusal Comments N/A    SW Recovery Care/Counseling Consult Complete  Complete  Palliative Care Screening Complete    Skilled Nursing Facility Not Applicable  Not Applicable

## 2023-07-15 NOTE — Progress Notes (Signed)
 Physical Therapy Treatment Patient Details Name: John [name] MRN: [id number] DOB: 1961-02-02 Today's Date: 07/15/2023   History of Present Illness Pt is 63 yo male who presented to the ED for evaluation of worsening back pain and shortness of breath. pt is s/p KP L2 on  5/23. 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    PT Comments  Pt received in bed completing HEP, very motivated to participate and progress function. Session focused on transfers and standing activity with +2 assist for safety due to risk of Left knee buckling. Good recall of log roll to transition to sitting EOB. Pt able to demonstrate sit<>stand in Stedy lift from elevated bed, CGA, and heavy reliance on upper body to pull up on equipment bar to stand. Pt able to maintain upright standing for 2+ minute intervals with knees locked in extension to prevent L knee buckling. Slight dizziness in standing with slight drop in BP sit to stand. Pt positioned in recliner via Stedy Lift, O2 increased to 5L to maintain <90%, however no significant SOB noted. Overall, pt is progressing with PT due to motivation level and strong desire to return to prior independent function. Left LE weakness is currently limiting functional progression, specifically at quad level, however has shown some strength return currently 3-/5. Pt's pain is well controlled allowing him to tolerate ~1 hour of PT and compliant with independent HEP throughout the day. Pt is an excellent acute rehab candidate. Will continue to progress per POC while awaiting reassessment from CIR team.   If plan is discharge home, recommend the following: A lot of help with walking and/or transfers;Two people to help  with walking and/or transfers;Assistance with cooking/housework;Help with stairs or ramp for entrance;Assist for transportation   Can travel by private vehicle     No  Equipment Recommendations  Wheelchair (measurements PT);Wheelchair cushion (measurements PT);Other (comment) (If d/c home will need 18" w/c, cushion, elevating leg rests, and swing away arm rests)    Recommendations for Other Services       Precautions / Restrictions Precautions Precautions: Fall;Back Precaution Booklet Issued: Yes (comment) (Continuous review on back precautions) Recall of Precautions/Restrictions: Intact Precaution/Restrictions Comments:  (Occasional reminders to limit twisting motion) Restrictions Weight Bearing Restrictions Per Provider Order: No Other Position/Activity Restrictions: Left foraminal to extraforaminal disc protrusion at L3-4, potentially affecting the exiting left L3 nerve root     Mobility  Bed Mobility Overal bed mobility: Needs Assistance Bed Mobility: Rolling, Sidelying to Sit Rolling: Used rails, Supervision Sidelying to sit: Contact guard assist, Used rails (Heavy reliance on side rail to transition to upright sitting)       General bed mobility comments: Without rails, pt would require Min/ModA for sidelying>sit    Transfers Overall transfer level: Needs assistance Equipment used: Ambulation equipment used Transfers: Sit to/from Stand, Bed to chair/wheelchair/BSC Sit to Stand: From elevated surface, Via lift equipment, Contact guard assist (Stedy)           General transfer comment: Pt able to come to standing from high elevated bed and heavy reliance from upper body to pull up on "Stedy" bar. Transfer via Lift Equipment: Stedy  Ambulation/Gait               General Gait Details: N/T, High fall risk for  buckling   Stairs             Wheelchair Mobility     Tilt Bed    Modified Rankin (Stroke Patients Only)       Balance Overall  balance assessment: Needs assistance Sitting-balance support: Single extremity supported, Feet supported Sitting balance-Leahy Scale: Fair Sitting balance - Comments: Pt tolerated 5+ minutes sitting EOB   Standing balance support: Bilateral upper extremity supported, Reliant on assistive device for balance Standing balance-Leahy Scale: Poor Standing balance comment: Heavy reliance on upper body to maintain static standing with knees locked in extension                            Communication Communication Communication: No apparent difficulties  Cognition Arousal: Alert Behavior During Therapy: WFL for tasks assessed/performed   PT - Cognitive impairments: No apparent impairments                       PT - Cognition Comments: Very pleasant and motivated, anxious at times due to Prednisone  Following commands: Intact      Cueing Cueing Techniques: Verbal cues  Exercises General Exercises - Lower Extremity Ankle Circles/Pumps: AROM, Both, 10 reps Quad Sets: AROM, Both, 10 reps Gluteal Sets: AROM, Both, 10 reps Long Arc Quad: AAROM, Left, 10 reps Heel Slides: AROM, Right, AAROM, Left, 15 reps Other Exercises Other Exercises:  (Pt educated on recs for acute rehab with good understanding of requirements) Other Exercises: Continued education on PLB and relaxation techniques to calm anxiety and assist with oxygenation    General Comments General comments (skin integrity, edema, etc.):  (Pt with c/o slight dizziness in standing, BP assessed in sitting/standing with slight drop and increased HR. Minimally symptomatic. SpO2 dropped to mid 80's on 4L requiring 5L post session)      Pertinent Vitals/Pain Pain Assessment Pain Assessment: Faces Faces Pain Scale: Hurts a little bit Pain Location: back Pain Descriptors / Indicators: Discomfort Pain Intervention(s): Monitored during session, Premedicated before session    Home Living                           Prior Function            PT Goals (current goals can now be found in the care plan section) Acute Rehab PT Goals Patient Stated Goal: Be independent again Progress towards PT goals: Progressing toward goals    Frequency    Min 3X/week      PT Plan      Co-evaluation              AM-PAC PT "6 Clicks" Mobility   Outcome Measure  Help needed turning from your back to your side while in a flat bed without using bedrails?: A Little Help needed moving from lying on your back to sitting on the side of a flat bed without using bedrails?: A Little Help needed moving to and from a bed to a chair (including a wheelchair)?: A Little Help needed standing up from a chair using your arms (e.g., wheelchair or bedside chair)?: A Lot Help needed to walk in hospital room?: Total Help needed climbing 3-5 steps with a railing? : Total 6 Click Score: 13    End of Session Equipment Utilized During Treatment: Gait belt;Oxygen;Other (comment) (Stedy for transfers) Activity Tolerance: Patient tolerated treatment well Patient left: in chair;with call bell/phone within reach Nurse  Communication: Mobility status;Other (comment);Need for lift equipment PT Visit Diagnosis: Other abnormalities of gait and mobility (R26.89);History of falling (Z91.81);Muscle weakness (generalized) (M62.81)     Time: 1610-9604 PT Time Calculation (min) (ACUTE ONLY): 49 min  Charges:    $Therapeutic Exercise: 8-22 mins $Therapeutic Activity: 23-37 mins PT General Charges $$ ACUTE PT VISIT: 1 Visit                    Melvyn Stagers, PTA  Diona Franklin 07/15/2023, 11:30 AM

## 2023-07-16 DIAGNOSIS — J9621 Acute and chronic respiratory failure with hypoxia: Secondary | ICD-10-CM | POA: Diagnosis not present

## 2023-07-16 DIAGNOSIS — M5416 Radiculopathy, lumbar region: Secondary | ICD-10-CM

## 2023-07-16 LAB — CBC WITH DIFFERENTIAL/PLATELET
Abs Immature Granulocytes: 0.06 10*3/uL (ref 0.00–0.07)
Basophils Absolute: 0 10*3/uL (ref 0.0–0.1)
Basophils Relative: 1 %
Eosinophils Absolute: 0 10*3/uL (ref 0.0–0.5)
Eosinophils Relative: 0 %
HCT: 33.3 % — ABNORMAL LOW (ref 39.0–52.0)
Hemoglobin: 10.7 g/dL — ABNORMAL LOW (ref 13.0–17.0)
Immature Granulocytes: 1 %
Lymphocytes Relative: 8 %
Lymphs Abs: 0.6 10*3/uL — ABNORMAL LOW (ref 0.7–4.0)
MCH: 30.7 pg (ref 26.0–34.0)
MCHC: 32.1 g/dL (ref 30.0–36.0)
MCV: 95.7 fL (ref 80.0–100.0)
Monocytes Absolute: 0.6 10*3/uL (ref 0.1–1.0)
Monocytes Relative: 9 %
Neutro Abs: 6.1 10*3/uL (ref 1.7–7.7)
Neutrophils Relative %: 81 %
Platelets: 228 10*3/uL (ref 150–400)
RBC: 3.48 MIL/uL — ABNORMAL LOW (ref 4.22–5.81)
RDW: 18.6 % — ABNORMAL HIGH (ref 11.5–15.5)
WBC: 7.4 10*3/uL (ref 4.0–10.5)
nRBC: 0 % (ref 0.0–0.2)

## 2023-07-16 LAB — COMPREHENSIVE METABOLIC PANEL WITH GFR
ALT: 35 U/L (ref 0–44)
AST: 25 U/L (ref 15–41)
Albumin: 3.3 g/dL — ABNORMAL LOW (ref 3.5–5.0)
Alkaline Phosphatase: 73 U/L (ref 38–126)
Anion gap: 6 (ref 5–15)
BUN: 19 mg/dL (ref 8–23)
CO2: 26 mmol/L (ref 22–32)
Calcium: 8.3 mg/dL — ABNORMAL LOW (ref 8.9–10.3)
Chloride: 101 mmol/L (ref 98–111)
Creatinine, Ser: 0.8 mg/dL (ref 0.61–1.24)
GFR, Estimated: >60 mL/min (ref >60)
Glucose, Bld: 98 mg/dL (ref 70–99)
Potassium: 3.4 mmol/L — ABNORMAL LOW (ref 3.5–5.1)
Sodium: 133 mmol/L — ABNORMAL LOW (ref 135–145)
Total Bilirubin: 0.9 mg/dL (ref 0.0–1.2)
Total Protein: 5.9 g/dL — ABNORMAL LOW (ref 6.5–8.1)

## 2023-07-16 LAB — MAGNESIUM: Magnesium: 2.2 mg/dL (ref 1.7–2.4)

## 2023-07-16 LAB — PHOSPHORUS: Phosphorus: 3.3 mg/dL (ref 2.5–4.6)

## 2023-07-16 NOTE — Consult Note (Signed)
 Physical Medicine and Rehabilitation Consult Reason for Consult: Evaluate appropriateness for Inpatient Rehab Referring Physician: Dr. Ada Acres    HPI: John [name] is a 63 y.o. male with PMHx of  has a past medical history of Arthritis, Bacteremia due to Pseudomonas, C. difficile colitis, Calculus of GB and bile duct w ac and chr cholecyst w obst (06/08/2023), Chronic anxiety (11/08/2014), Cough (09/22/2021), COVID (2020), Depression, Drug-induced neutropenia (HCC) (08/11/2020), Family history of adverse reaction to anesthesia, GERD (gastroesophageal reflux disease), History of radiation therapy (04/24/2020-05/16/2020), History of radiation therapy, History of radiation therapy, Neutropenia (HCC) (05/12/2020), Neutropenic fever (HCC) (05/12/2020), Normocytic anemia (04/18/2021), PAF (paroxysmal atrial fibrillation) (HCC), Rheumatoid aortitis, Secondary hypercoagulable state (HCC) (10/28/2020), Sepsis (HCC) (10/06/2020), Shortness of breath (12/18/2020), and Squamous cell lung cancer (HCC). . They were admitted to [facility] on 07/02/2023 for back pain and shortness of breath.  Note he had been hospitalized for pneumonia already within the last month. A few days before hospitalization, he noticed weakness in his left leg and pain which was progressive, along with intractable back pain.  In the ER, workup showed leukopenia 1.3, chest x-ray with right basilar consolidation, CTA with signs of multifocal pneumonia but no PE.  He was admitted to the hospital for acute on chronic hypoxic respiratory failure.  Oncology Dr. Maria Shiner was consulted for neutropenia in the setting of stage IV lung cancer, on chemotherapy, along with severe back pain, who recommended Neupogen  and MRI.  MRI thoracic and lumbar spine 5/20 showed acute compression fracture with 60% height loss of L2, and chronic compression fractures of T12, L1, L3, L4, and L5; additionally showed moderate bilateral L4 neuroforaminal narrowing,  and L3-4 disc protrusion potentially affecting the left L3 nerve.  He went L2 kyphoplasty with interventional radiology on 5-23.  Hospitalization has been otherwise complicated by intractable back pain on fentanyl  patch with as needed IV and p.o. 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  PM&R was consulted to evaluate appropriateness for IPR admission.    Per chart review, prior to admission patient was independent for ADLs and modified independent for mobility with a rolling walker, with some assistance for managing oxygen for which he is on 3 L baseline.  Patient lives in a single-story home with 4 steps to enter, with his wife who is able to work from home 2 days per week; also has a Theatre stage manager they are hiring to assist 3-4 days per week for specific tasks like bathing and cooking.  Currently, he is contact-guard assist to supervision for bed mobility and transfers with a steady, but has not attempted gait per chart review.  He has stood for 2+ minutes in the parallel bars with knees locked in extension but was limited by deconditioning and left quad weakness 3-/5.  He tolerated a 1 hour-long therapy session earlier today, and is reported very motivated to participate in therapies.  Review of Systems  Constitutional:  Negative for chills and fever.  Eyes:  Negative for blurred vision and double vision.  Respiratory:  Positive for shortness of breath. Negative for cough.   Cardiovascular:  Positive for orthopnea. Negative for chest pain and palpitations.  Gastrointestinal:  Negative for constipation, diarrhea, nausea and vomiting.  Genitourinary:  Negative for dysuria and urgency.  Musculoskeletal:  Positive for back pain, falls, joint  pain and neck pain.  Neurological:  Positive for  tingling, sensory change and focal weakness. Negative for dizziness and headaches.  Psychiatric/Behavioral:  Negative for depression. The patient is nervous/anxious.    Past Medical History:  Diagnosis Date   Arthritis    Rheumatoid   Bacteremia due to Pseudomonas    C. difficile colitis    Calculus of GB and bile duct w ac and chr cholecyst w obst 06/08/2023   Chronic anxiety 11/08/2014   Cough 09/22/2021   COVID 2020   mild case   Depression    Drug-induced neutropenia (HCC) 08/11/2020   Family history of adverse reaction to anesthesia    brother with seizures had episode under anesthesia.  Patient has seizures as well.   GERD (gastroesophageal reflux disease)    History of radiation therapy 04/24/2020-05/16/2020   IMRT to right lung     Dr Retta Caster   History of radiation therapy    08/10/21-08/19/21-Dr. Retta Caster   History of radiation therapy    Abdomen- 03/21/23-03/31/23-Dr. Retta Caster   Neutropenia [facility]) 05/12/2020   Neutropenic fever (HCC) 05/12/2020   Normocytic anemia 04/18/2021   PAF (paroxysmal atrial fibrillation) (HCC)    CHADS2VSAC score 3   Rheumatoid aortitis    Secondary hypercoagulable state (HCC) 10/28/2020   Sepsis (HCC) 10/06/2020   Shortness of breath 12/18/2020   Squamous cell lung cancer (HCC)    Past Surgical History:  Procedure Laterality Date   BRONCHIAL BRUSHINGS  03/27/2020   Procedure: BRONCHIAL BRUSHINGS;  Surgeon: Denson Flake, MD;  Location: [facility] ENDOSCOPY;  Service: Cardiopulmonary;;   BUBBLE STUDY  10/13/2020   Procedure: BUBBLE STUDY;  Surgeon: Hazle Lites, MD;  Location: [facility] ENDOSCOPY;  Service: Cardiovascular;;   CARDIOVERSION N/A 10/13/2020   Procedure: CARDIOVERSION;  Surgeon: Hazle Lites, MD;  Location: [facility] ENDOSCOPY;  Service: Cardiovascular;  Laterality: N/A;   CATARACT EXTRACTION  2016   at [facility]   CHOLECYSTECTOMY N/A 06/08/2023   Procedure: LAPAROSCOPIC CHOLECYSTECTOMY;  Surgeon: Junie Olds, MD;  Location:  [facility] ORS;  Service: General;  Laterality: N/A;   FINE NEEDLE ASPIRATION  03/27/2020   Procedure: FINE NEEDLE ASPIRATION;  Surgeon: Denson Flake, MD;  Location: [facility] ENDOSCOPY;  Service: Cardiopulmonary;;   HIP SURGERY Left    IR KYPHO EA ADDL LEVEL THORACIC OR LUMBAR  10/12/2021   IR KYPHO LUMBAR INC FX REDUCE BONE BX UNI/BIL CANNULATION INC/IMAGING  10/12/2021   IR KYPHO LUMBAR INC FX REDUCE BONE BX UNI/BIL CANNULATION INC/IMAGING  07/08/2023   IR KYPHO THORACIC WITH BONE BIOPSY  09/07/2021   IR RADIOLOGIST EVAL & MGMT  09/03/2021   RADIOLOGY WITH ANESTHESIA N/A 10/12/2021   Procedure: L1 and L3 Kyphoplasty;  Surgeon: de Macedo Rodrigues, Katyucia, MD;  Location: [facility] OR;  Service: Radiology;  Laterality: N/A;   TEE WITHOUT CARDIOVERSION N/A 10/13/2020   Procedure: TRANSESOPHAGEAL ECHOCARDIOGRAM (TEE);  Surgeon: Hazle Lites, MD;  Location: [facility] ENDOSCOPY;  Service: Cardiovascular;  Laterality: N/A;   VIDEO BRONCHOSCOPY WITH ENDOBRONCHIAL ULTRASOUND N/A 03/27/2020   Procedure: VIDEO BRONCHOSCOPY WITH ENDOBRONCHIAL ULTRASOUND;  Surgeon: Denson Flake, MD;  Location: [facility] ENDOSCOPY;  Service: Cardiopulmonary;  Laterality: N/A;   Family History  Problem Relation Age of Onset   Arrhythmia Mother        has PPM   Heart disease Father        Died at 65, started in his 85s, heart attacks, had PPM and ICD   Social History:  reports that  he quit smoking about 7 years ago. His smoking use included cigarettes. He started smoking about 37 years ago. He has a 30 pack-year smoking history. He has been exposed to tobacco smoke. He has never used smokeless tobacco. He reports that he does not currently use alcohol. He reports that he does not use drugs. Allergies:  Allergies  Allergen Reactions   Ventolin  [Albuterol ] Other (See Comments)    Patient has had episode of atrial fib following administration of albuterol  (tolerates Xopenex  well)   Lipitor [Atorvastatin ] Other (See Comments)    Arthralgias     Oxycodone  Other (See Comments)    Severe Migranes   Ketamine  Anxiety    "Feeling of doom"   Medications Prior to Admission  Medication Sig Dispense Refill   acetaminophen  (TYLENOL ) 500 MG tablet Take 500 mg by mouth daily as needed for moderate pain (pain score 4-6), fever or headache.     amiodarone  (PACERONE ) 200 MG tablet START by taking 2 tablets (400mg ) daily for 2 weeks THEN reduce to 1 tablet (200mg ) daily (Patient taking differently: Take 200 mg by mouth daily.) 90 tablet 3   benzonatate  (TESSALON ) 200 MG capsule Take 1 capsule (200 mg total) by mouth 3 (three) times daily as needed. 45 capsule 1   ELIQUIS  5 MG TABS tablet TAKE 1 TABLET(5 MG) BY MOUTH TWICE DAILY (Patient taking differently: Take 5 mg by mouth 2 (two) times daily.) 60 tablet 5   Evolocumab  (REPATHA  SURECLICK) 140 MG/ML SOAJ ADMINISTER 1 ML UNDER THE SKIN EVERY 14 DAYS 6 mL 3   HYDROcodone  bit-homatropine (HYDROMET) 5-1.5 MG/5ML syrup Take 5 mLs by mouth every 6 (six) hours as needed for cough. 120 mL 0   lidocaine  (LIDODERM ) 5 % Place 1 patch onto the skin daily. Remove & Discard patch within 12 hours or as directed by MD 30 patch 4   metoprolol  succinate (TOPROL -XL) 25 MG 24 hr tablet TAKE 1 TABLET(25 MG) BY MOUTH DAILY 90 tablet 3   ondansetron  (ZOFRAN ) 8 MG tablet Take 1 tablet (8 mg total) by mouth every 8 (eight) hours as needed for nausea or vomiting. 20 tablet 3   oxyCODONE -acetaminophen  (PERCOCET/ROXICET) 5-325 MG tablet Take 1-2 tablets by mouth every 6 (six) hours as needed. 90 tablet 0   PARoxetine  (PAXIL ) 10 MG tablet Take 1 tablet (10 mg total) by mouth at bedtime. 90 tablet 2   polyethylene glycol (MIRALAX  / GLYCOLAX ) 17 g packet Take 17 g by mouth daily as needed for mild constipation.     predniSONE  (DELTASONE ) 5 MG tablet Take 1 tablet (5 mg total) by mouth daily with breakfast. 90 tablet 4   prochlorperazine  (COMPAZINE ) 10 MG tablet Take 1 tablet (10 mg total) by mouth every 6 (six) hours as needed for  nausea or vomiting. 30 tablet 3   TRELEGY ELLIPTA  100-62.5-25 MCG/ACT AEPB INHALE 1 PUFF INTO THE LUNGS DAILY 60 each 5    Home: Home Living Family/patient expects to be discharged to:: Private residence Living Arrangements: Spouse/significant other Available Help at Discharge: Family, Available PRN/intermittently Type of Home: House Home Access: Stairs to enter [employer] of Steps: 4 Entrance Stairs-Rails: Left Home Layout: One level Bathroom Shower/Tub: Tub/shower unit, Engineer, building services: Standard Bathroom Accessibility: Yes Home Equipment: Agricultural consultant (2 wheels), BSC/3in1, Shower seat, Hand held shower head, Wheelchair - manual Additional Comments: works from home and wife able to work from home  Functional History: Prior Function Prior Level of Function : Independent/Modified Independent Mobility Comments: uses rw; O2  dependent at 3L. recently needing more assist. (since recent hospital admission - choleycystectomy ADLs Comments: uses O2 at home prn 3 lts, independent pta Functional Status:  Mobility: Bed Mobility Overal bed mobility: Needs Assistance Bed Mobility: Rolling, Sidelying to Sit Rolling: Used rails, Supervision Sidelying to sit: Contact guard assist, Used rails (Heavy reliance on side rail to transition to upright sitting) Sit to sidelying: Min assist, Used rails, Contact guard assist (for B LE's) General bed mobility comments: Without rails, pt would require Min/ModA for sidelying>sit Transfers Overall transfer level: Needs assistance Equipment used: Ambulation equipment used Transfers: Sit to/from Stand, Bed to chair/wheelchair/BSC Sit to Stand: From elevated surface, Via lift equipment, Contact guard assist (Stedy) Bed to/from chair/wheelchair/BSC transfer type:: Via Lift equipment (Stedy) Squat pivot transfers: Contact guard assist  Lateral/Scoot Transfers: Contact guard assist Transfer via Lift Equipment: Stedy General transfer  comment: Pt able to come to standing from high elevated bed and heavy reliance from upper body to pull up on "Stedy" bar. Ambulation/Gait General Gait Details: N/T, High fall risk for buckling Wheelchair Mobility Wheelchair mobility: Yes Wheelchair propulsion: Both upper extremities, Right lower extremity Wheelchair parts: Needs assistance Distance: 200 Wheelchair Assistance Details (indicate cue type and reason): Unable to actively assist with L LE, cues to initiate brakes prior to transferring from w/c and benefits of frequent rest breaks  ADL: ADL Overall ADL's : Needs assistance/impaired Eating/Feeding: Bed level, Modified independent Grooming: Sitting, Wash/dry face, Oral care Grooming Details (indicate cue type and reason): sitting EOB with increased time. after completion of task patient reported that he had dizziness. BP was checked with 123/90 mmhg. Upper Body Bathing: Moderate assistance, Bed level Lower Body Bathing: Moderate assistance, Bed level Upper Body Dressing : Moderate assistance, Bed level Lower Body Dressing: Moderate assistance, Bed level Lower Body Dressing Details (indicate cue type and reason): patient reported he was unable to figure four either leg. AE bag retrieved only for patient to figure four leg while attempting to get sock aid over R foot. patient was educated that this was used to prevent need to reach feet. patient verbalized understanding reporting that he is unable to reach his LLE like that but then noted to pull it up onto lap with shakiness noted of LLE. patient again educated on how AE was useful to prevent need to use BUE to maintain figure four position and possible discomfort at back. patient verbalized understanding, Toilet Transfer: Moderate assistance Toilet Transfer Details (indicate cue type and reason): scoot transfer to R side from reclienr to w/c and w/c to bed with increased time. General ADL Comments: patient was able to scoot two scoots  up to [facility] while sitting EOB with CGA  Cognition: Cognition Orientation Level: Oriented X4 Cognition Arousal: Alert Behavior During Therapy: [facility] for tasks assessed/performed  Blood pressure 113/70, pulse 77, temperature 98.5 F (36.9 C), temperature source Oral, resp. rate 17, height 5\' 11"  (1.803 m), weight 106.1 kg, SpO2 95%. Physical Exam  Constitutional: No apparent distress. Appropriate appearance for age.  Sitting upright in bedside chair. HENT: No JVD. Neck Supple. Trachea midline. Atraumatic, normocephalic. Eyes: PERRLA. EOMI. Visual fields grossly intact.  Cardiovascular: RRR, no edema. Peripheral pulses 2+  Respiratory: CTAB. No rales, rhonchi, or wheezing.  Somewhat diminished in the bilateral bases.  On 3 L nasal cannula. Abdomen: + bowel sounds, normoactive. No distention or tenderness.  Skin: C/D/I.  Some healing scabs on right chin, bilateral upper extremities.  Prior kyphoplasty site healed.  MSK:      No  apparent deformity.  Full passive range of motion all 4 limbs. + Arthritic changes in the hands.       Neurologic exam:  Cognition: AAO to person, place, time and event.  Language: Fluent, No substitutions or neoglisms. No dysarthria. Names 3/3 objects correctly.  Memory: Recalls 3/3 objects at 5 minutes. No apparent deficits  Insight: Good  insight into current condition.  Mood: Pleasant affect, appropriate mood.  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 Negative Hoffman's and babinski signs bilaterally.  CN: 2-12 grossly intact.  Coordination: No apparent tremors. No ataxia on FTN, HTS bilaterally.  Spasticity: MAS 0 in all extremities.       Strength:                RUE: 4/5 SA, 5/5 EF, 5/5 EE, 5/5 WE, 5/5 FF, 5/5 FA                LUE:  4/5 SA, 5/5 EF, 5/5 EE, 5/5 WE, 5/5 FF, 5/5 FA                RLE: 4/5 HF, 4/5 KE, 5/5  DF, 5/5   EHL, 5/5  PF                 LLE:  3-/5 HF, 3+/5 KE, 4-/5  DF, 4-/5  EHL, 4+/5  PF     Results for orders placed or performed during the hospital encounter of 07/02/23 (from the past 24 hours)  CBC with Differential/Platelet     Status: Abnormal   Collection Time: 07/16/23  4:26 AM  Result Value Ref Range   WBC 7.4 4.0 - 10.5 K/uL   RBC 3.48 (L) 4.22 - 5.81 MIL/uL   Hemoglobin 10.7 (L) 13.0 - 17.0 g/dL   HCT 02.7 (L) 25.3 - 66.4 %   MCV 95.7 80.0 - 100.0 fL   MCH 30.7 26.0 - 34.0 pg   MCHC 32.1 30.0 - 36.0 g/dL   RDW 40.3 (H) 47.4 - 25.9 %   Platelets 228 150 - 400 K/uL   nRBC 0.0 0.0 - 0.2 %   Neutrophils Relative % 81 %   Neutro Abs 6.1 1.7 - 7.7 K/uL   Lymphocytes Relative 8 %   Lymphs Abs 0.6 (L) 0.7 - 4.0 K/uL   Monocytes Relative 9 %   Monocytes Absolute 0.6 0.1 - 1.0 K/uL   Eosinophils Relative 0 %   Eosinophils Absolute 0.0 0.0 - 0.5 K/uL   Basophils Relative 1 %   Basophils Absolute 0.0 0.0 - 0.1 K/uL   Immature Granulocytes 1 %   Abs Immature Granulocytes 0.06 0.00 - 0.07 K/uL  Comprehensive metabolic panel with GFR     Status: Abnormal   Collection Time: 07/16/23  4:26 AM  Result Value Ref Range   Sodium 133 (L) 135 - 145 mmol/L   Potassium 3.4 (L) 3.5 - 5.1 mmol/L   Chloride 101 98 - 111 mmol/L   CO2 26 22 - 32 mmol/L   Glucose, Bld 98 70 - 99 mg/dL   BUN 19 8 - 23 mg/dL   Creatinine, Ser 5.63 0.61 - 1.24 mg/dL   Calcium  8.3 (L) 8.9 - 10.3 mg/dL   Total Protein 5.9 (L) 6.5 - 8.1 g/dL   Albumin  3.3 (L) 3.5 - 5.0 g/dL   AST 25 15 - 41 U/L   ALT  35 0 - 44 U/L   Alkaline Phosphatase 73 38 - 126 U/L   Total Bilirubin 0.9 0.0 - 1.2 mg/dL   GFR, Estimated >01 >02 mL/min   Anion gap 6 5 - 15  Magnesium      Status: None   Collection Time: 07/16/23  4:26 AM  Result Value Ref Range   Magnesium  2.2 1.7 - 2.4 mg/dL  Phosphorus     Status: None   Collection Time: 07/16/23  4:26 AM  Result Value Ref Range   Phosphorus 3.3 2.5 - 4.6 mg/dL   No results  found.  Assessment/Plan: Diagnosis: Left lumbar radiculopathy status post L2 compression fracture with kyphoplasty Does the need for close, 24 hr/day medical supervision in concert with the patient's rehab needs make it unreasonable for this patient to be served in a less intensive setting? Yes Co-Morbidities requiring supervision/potential complications: L2 compression fracture with intractable back pain on fentanyl  patch with as needed IV and p.o. 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 Due to bladder management, bowel management, safety, skin/wound care, disease management, medication administration, pain management, and patient education, does the patient require 24 hr/day rehab nursing? Yes Does the patient require coordinated care of a physician, rehab nurse, therapy disciplines of PT, OT to address physical and functional deficits in the context of the above medical diagnosis(es)? Yes Addressing deficits in the following areas: balance, endurance, locomotion, strength, transferring, bathing, dressing, grooming, and toileting Can the patient actively participate in an intensive therapy program of at least 3 hrs of therapy per day at least 5 days per week? Yes The potential for patient to make measurable gains while on inpatient rehab is excellent Anticipated functional outcomes upon discharge from inpatient rehab are modified independent  with PT, modified independent with OT. Estimated rehab length of stay to reach the above functional goals is: 7-10 days Anticipated discharge destination: Home Overall Rehab/Functional Prognosis: good  POST ACUTE RECOMMENDATIONS: This patient's condition is appropriate for continued rehabilitative care in the following setting: CIR Patient has agreed to  participate in recommended program. Yes Note that insurance prior authorization may be required for reimbursement for recommended care.  Comment: John [name] is a 63 year old male presenting with intractable back pain, found to have multiple vertebral compression fractures including acute on chronic L2 compression fracture, underwent kyphoplasty and is now starting to regain some significant function of his left lower extremity.  Patient endorses prior to the last 2 days, he was essentially flaccid in this leg, but has regained most of his sensation and is starting to regain some strength.  He is currently contact-guard assist for bed mobility and transfers, but still requiring assistance for lower body ADLs and has been unable to advance to gait, although on exam has the strength to start doing this and may be more successful with use of additional bracing devices.  He has multiple medical comorbidities listed above meet medical necessity, and intermittent assistance at home from his wife and a hired Theatre stage manager, but nobody available for 24/7 assist.  Thus, he will need to be mostly modified independent to return to home safely; feel that short rehab stay of 7 to 10 days is reasonable to reach this goal.    I have personally performed a face to face diagnostic evaluation of this patient. Additionally, I have examined  the patient's medical record including any pertinent labs and radiographic images. If the physician assistant has documented in this note, I have reviewed and edited or otherwise concur with the physician assistant's documentation.  Thanks,  Bea Lime, DO 07/16/2023

## 2023-07-16 NOTE — Plan of Care (Signed)
 PMT no charge note.   Discussed in an interdisciplinary manner with [facility] MD colleague, medication regimen also reviewed.  Patient reported with improvement in pain.  Also undergoing steroid taper.  We discussed about discontinuing 12 mcg transdermal fentanyl  patch and to continue with 25 mcg transdermal fentanyl  patch at this time.  Continue other pain and non-- pain symptom management regimen, continue to monitor. No charge Lujean Sake MD [place] [facility].

## 2023-07-16 NOTE — Progress Notes (Signed)
 Inpatient Rehab Admissions:  Physiatrist saw pt for a consult. CIR is being recommended. I spoke with patient at the bedside for rehabilitation assessment and to discuss goals and expectations of an inpatient rehab admission.  Discussed average length of stay, insurance authorization requirement and discharge home after completion of CIR. Pt acknowledged understanding and would like to pursue CIR. Pt gave permission to contact wife Delores Fester. Spoke with Delores Fester on the telephone. Discussed CIR goals and expectations. She acknowledged understanding. She is supportive of pt pursuing CIR. She confirmed that family and friends will be able to provide 24/7 support for pt after discharge. Will continue to follow.  Signed: Artemus Larsen, MS, CCC-SLP Admissions Coordinator [phone]

## 2023-07-16 NOTE — Progress Notes (Signed)
 Mobility Specialist - Progress Note   07/16/23 1149  Mobility  Activity Transferred from bed to chair  Level of Assistance Contact guard assist, steadying assist  Assistive Device Stedy  Range of Motion/Exercises Active  Activity Response Tolerated well  Mobility visit 1 Mobility  Mobility Specialist Start Time (ACUTE ONLY) 1139  Mobility Specialist Stop Time (ACUTE ONLY) 1149  Mobility Specialist Time Calculation (min) (ACUTE ONLY) 10 min   Pt was found in bed and agreeable to transfer to recliner chair. No complaints with session and was left on recliner chair with all needs met. Call bell in reach and RN notified.  Lorna Rose,  Mobility Specialist Can be reached via Secure Chat

## 2023-07-16 NOTE — Progress Notes (Signed)
 Inpatient Rehab Admissions Coordinator:  Consult received. Discussed with Dr. Dorn Gaskins. Note pt is currently Supervision-CGA with bed mobility and CGA with transfers with the [facility]. Pt has not attempted gait in therapy sessions. At this time, pt does not appear to be able to tolerate the intensity of CIR. Will follow tolerance and progress with therapy from a distance for 1-2 sessions to help further determine appropriateness of CIR.   Artemus Larsen, MS, CCC-SLP Admissions Coordinator [phone]

## 2023-07-16 NOTE — Progress Notes (Signed)
 PROGRESS NOTE    John [name]  ZOX:[id number] DOB: 02-15-1961 DOA: 07/02/2023 PCP: Patient, No Pcp Per  Chief Complaint  Patient presents with   Back Pain   Leg Pain    Brief Narrative:    63 y.o. 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    He reported back pain for months, but worsened before admission.  He was walking without assistive device before this hospitalization.  [place] few days before the hospitalization, he noticed left leg weakness and pain which has progressed.    He was admitted for intractable back pain, found to have an acute compression fracture of L2.  Now s/p kyphoplasty.  He continues to have left lower extremity weakness.   Assessment & Plan:   Principal Problem:   Acute on chronic respiratory failure with hypoxia [facility]) Active Problems:   Intractable back pain   Pain of metastatic malignancy   S/P kyphoplasty - L2 on 08-06-23   Stage 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 (HCC)   Hyponatremia   SIADH (syndrome of inappropriate ADH production) (HCC)   PAF (paroxysmal atrial fibrillation) (HCC)   Obesity, Class I, BMI 30-34.9   Need for emotional support   Counseling and coordination of care   Squamous cell carcinoma of lung (HCC)   Medication management   Cancer associated pain   Palliative care encounter   High risk medication use   Drug-induced constipation  Intractable Back Pain Left Lower Extremity Pain  Left Lower Extremity Weakness Compression Fracture of L2  Chronic compression Deformities (T12, L1, L3, L4, and L5 Vertebral Bodies)  S/p L2 kyphoplasty on  August 06, 2023 MRI T/L spine showed acute compression fracture involving the inferior endplate of L2.  Also chronic compression deformities of T12, L1, L3, L4, and L5.  Multifactorial degenerative changes L4-5 with resultant moderate canal and left greater than right lateral recess stenosis with mild to moderate bilatera L4 foraminal narrowing.  L foraminal to extraforaminal disc protrusion at L3-4 potentially affecting the exiting L L3 nerve root. Pain in left leg is improved with steroids, ? If this is radicular (related to disc protrusion vs stenosis) Also has significant LLE weakness associated with this - discussed with nsgy (no recommendations for any surgical intervention per discussion with nsgy on call).  He's improving with steroids, will monitor.    Schedule APAP. Fentanyl  patch (will cut back with improvement in pain).  Dilaudid  IV and PO prn.  Lidocaine  patch.  Robaxin .  Steroid taper.   PT/OT - declined by CIR (see 5/27 note from Loye Rumble PT)) Workup additionally as indicated  Acute on Chronic Hypoxic Respiratory Failure Uses 3 L at baseline On 5 L today, wean back to baseline Required up to 15 L HFNC during this hospitalization  CT PE protocol without PE, multifocal areas of GGO throughout both lungs along with peripheral reticular opacities (infectious/inflammatory).  Stable focal airspace opacity in R perihilar region and sperior segment of the RLL with central cavitation.   Cefepime  5/17-5/21 Negative covid, flu, RSV.  Negative urine strep, negative urine legionella, negative MRSA PCR. He's currently on home o2  Stage IV Squamous Cell Carcinoma of R Lung Following with  Dr. Maria Shiner on gemzar  and navelbine   Atrial Fibrillation Eliquis  Metop  SIADH Stable on salt tabs and fluid restriction  Obesity Body mass index is 32.64 kg/m.    DVT prophylaxis: eliquis  Code Status: full Family Communication: none Disposition:   Status is: Inpatient Remains inpatient appropriate  because: need for ongoing inpatient care   Consultants:  Oncology palliative  Procedures:  L2 kyphoplasty 5/23  Antimicrobials:  Anti-infectives (From admission, onward)    Start     Dose/Rate Route Frequency Ordered Stop   07/07/23 0600  ceFAZolin  (ANCEF ) IVPB 2g/100 mL premix       Note to Pharmacy: Patient is at [facility] and will transfer round-trip to [facility] for procedure.   2 g 200 mL/hr over 30 Minutes Intravenous To Radiology 07/05/23 1734 07/08/23 0753   07/03/23 0800  vancomycin  (VANCOCIN ) IVPB 1000 mg/200 mL premix  Status:  Discontinued        1,000 mg 200 mL/hr over 60 Minutes Intravenous Every 12 hours 07/02/23 2146 07/03/23 0722   07/03/23 0200  ceFEPIme  (MAXIPIME ) 2 g in sodium chloride  0.9 % 100 mL IVPB  Status:  Discontinued        2 g 200 mL/hr over 30 Minutes Intravenous Every 8 hours 07/02/23 2141 07/06/23 1454   07/02/23 1845  vancomycin  (VANCOREADY) IVPB 2000 mg/400 mL        2,000 mg 200 mL/hr over 120 Minutes Intravenous  Once 07/02/23 1813 07/02/23 2120   07/02/23 1815  vancomycin  (VANCOCIN ) IVPB 1000 mg/200 mL premix  Status:  Discontinued        1,000 mg 200 mL/hr over 60 Minutes Intravenous  Once 07/02/23 1807 07/02/23 1813   07/02/23 1815  ceFEPIme  (MAXIPIME ) 2 g in sodium chloride  0.9 % 100 mL IVPB        2 g 200 mL/hr over 30 Minutes Intravenous  Once 07/02/23 1807 07/02/23 1911       Subjective: No complaints  Objective: Vitals:   07/15/23 1429 07/15/23 2016 07/16/23 0421 07/16/23 0856  BP: 101/72 111/75 114/81 114/81  Pulse: 83 75 76 76  Resp: 18     Temp: 97.9 F (36.6 C) 97.8 F (36.6 C) 97.9 F (36.6 C)   TempSrc: Oral Oral Oral   SpO2: 94% 98% 97%   Weight:      Height:        Intake/Output Summary (Last 24 hours) at 07/16/2023 1013 Last data filed at 07/16/2023 0900 Gross per 24 hour  Intake --  Output 575 ml  Net -575 ml   Filed Weights   07/03/23 0155  Weight: 106.1 kg    Examination:  General: No acute  distress. Cardiovascular: RRR Lungs: unlabored Neurological: LLE weakness appears to be improving.  Extremities: No clubbing or cyanosis. No edema.   Data Reviewed: I have personally reviewed following labs and imaging studies  CBC: Recent Labs  Lab 07/12/23 0336 07/13/23 0400 07/14/23 0353 07/15/23 0341 07/16/23 0426  WBC 4.8 5.1 7.5 7.1 7.4  NEUTROABS  --   --  6.1 5.8 6.1  HGB 10.3* 10.8* 10.6* 11.0* 10.7*  HCT 32.0* 34.2* 33.0* 34.9* 33.3*  MCV 95.8 96.6 96.8 97.8 95.7  PLT 266 238 248 248 228    Basic Metabolic Panel: Recent Labs  Lab 07/11/23 0401 07/14/23 0353 07/15/23 0341 07/16/23 0426  NA 132* 132* 134* 133*  K 4.0 3.7 3.6 3.4*  CL 97* 99 103 101  CO2 26 25 25 26   GLUCOSE 114* 108* 89 98  BUN 18 16 16 19   CREATININE 0.76 0.63 0.76 0.80  CALCIUM  8.2* 8.5* 8.3* 8.3*  MG 2.1 2.1 2.1 2.2  PHOS  --  3.3 3.3 3.3    GFR: Estimated Creatinine Clearance: 120.1 mL/min (by C-G formula based on SCr of 0.8 mg/dL).  Liver Function Tests: Recent Labs  Lab 07/13/23 2047 07/14/23 0353 07/15/23 0341 07/16/23 0426  AST  --  27 24 25   ALT  --  35 34 35  ALKPHOS  --  78 73 73  BILITOT  --  1.0 0.9 0.9  PROT  --  5.8* 5.9* 5.9*  ALBUMIN  3.2* 3.3* 3.2* 3.3*    CBG: No results for input(s): "GLUCAP" in the last 168 hours.   No results found for this or any previous visit (from the past 240 hours).       Radiology Studies: No results found.      Scheduled Meds:  acetaminophen   1,000 mg Oral Q8H   amiodarone   200 mg Oral Daily   apixaban   5 mg Oral BID   budesonide -glycopyrrolate -formoterol   2 puff Inhalation BID   feeding supplement  237 mL Oral TID BM   fentaNYL   1 patch Transdermal Q72H   fentaNYL   1 patch Transdermal Q72H   lidocaine   1 patch Transdermal Q24H   metoprolol  succinate  25 mg Oral Daily   pantoprazole   40 mg Oral Daily   PARoxetine   10 mg Oral QHS   polyethylene glycol  17 g Oral Daily   predniSONE   40 mg Oral Q breakfast    Followed by   Cecily Cohen ON 07/18/2023] predniSONE   30 mg Oral Q breakfast   Followed by   Cecily Cohen ON 07/20/2023] predniSONE   20 mg Oral Q breakfast   Followed by   Cecily Cohen ON 07/22/2023] predniSONE   10 mg Oral Q breakfast   Followed by   Cecily Cohen ON 07/24/2023] predniSONE   5 mg Oral Q breakfast   senna-docusate  2 tablet Oral BID   sodium chloride   1 g Oral BID WC   Vitamin D  (Ergocalciferol )  50,000 Units Oral Q7 days   Continuous Infusions:   LOS: 14 days    Time spent: over 30 min    Donnetta Gains, MD [facility]   To contact the attending provider between 7A-7P or the covering provider during after hours 7P-7A, please log into the web site [email or web address] and access using universal [place] password for that web site. If you do not have the password, please call the hospital operator.  07/16/2023, 10:13 AM

## 2023-07-17 DIAGNOSIS — J9621 Acute and chronic respiratory failure with hypoxia: Secondary | ICD-10-CM | POA: Diagnosis not present

## 2023-07-17 NOTE — Plan of Care (Signed)
   Problem: Safety: Goal: Ability to remain free from injury will improve Outcome: Progressing   Problem: Skin Integrity: Goal: Risk for impaired skin integrity will decrease Outcome: Progressing

## 2023-07-17 NOTE — Progress Notes (Addendum)
 Occupational Therapy Treatment Patient Details Name: John [name] MRN: [id number] DOB: [birth year] Today's Date: 07/17/2023   History of present illness Pt is 63 yo male who presented to the ED for evaluation of worsening back pain and shortness of breath. pt is s/p KP L2 on  5/23. 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   OT comments  Pt progressing towards acute OT goals. Pt able to transfer from EOB to recliner utilizing Stedy, also completed a couple of minutes weight-shifting and simulated walking in [place]. L knee noted to buckle at times with mobility. Given pt's progress and motivation to continue working with therapy, updating d/c recommendation to AIR.       If plan is discharge home, recommend the following:  A little help with bathing/dressing/bathroom;A little help with walking and/or transfers;Help with stairs or ramp for entrance;A lot of help with bathing/dressing/bathroom;A lot of help with walking and/or transfers   Equipment Recommendations  None recommended by OT    Recommendations for Other Services      Precautions / Restrictions Precautions Precautions: Fall;Back Recall of Precautions/Restrictions: Intact Precaution/Restrictions Comments: VC's for proper tech "log roll". Restrictions Weight Bearing Restrictions Per Provider Order: No       Mobility Bed Mobility Overal bed mobility: Needs Assistance Bed Mobility: Rolling, Sidelying to Sit Rolling: Used rails, Supervision Sidelying to sit: Contact guard assist, HOB elevated, Used rails (pt utilizes rails and momentum)            Transfers Overall transfer level: Needs assistance Equipment used: Ambulation equipment  used Transfers: Sit to/from Stand, Bed to chair/wheelchair/BSC Sit to Stand: From elevated surface, Via lift equipment, Contact guard assist (Stedy)           General transfer comment: Pt utilizes Stedy bar to powerup and to control descent into sitting also for stabilization 2/2 L knee buckling at times. Transfer via Lift Equipment: Stedy   Balance Overall balance assessment: Needs assistance Sitting-balance support: Single extremity supported, Feet supported Sitting balance-Leahy Scale: Fair     Standing balance support: Bilateral upper extremity supported, Reliant on assistive device for balance Standing balance-Leahy Scale: Poor Standing balance comment: Heavy reliance on upper body to maintain static standing with knees locked in extension                           ADL either performed or assessed with clinical judgement   ADL Overall ADL's : Needs assistance/impaired                                       General ADL Comments: Pt able to transfer from EOB to recliner utilizing Steady for additional support d/t LLE weakness and L knee buckling. Able to simulate walking for a minute or two standing in the [place] prior to transfer to recliner.    Extremity/Trunk Assessment Upper Extremity Assessment Upper Extremity Assessment: Overall WFL for tasks assessed   Lower Extremity Assessment Lower Extremity Assessment: Defer to PT evaluation        Vision       Perception     Praxis     Communication Communication Communication: No apparent difficulties  Cognition Arousal: Alert Behavior During Therapy: WFL for tasks assessed/performed Cognition: No apparent impairments                               Following commands: Intact        Cueing   Cueing Techniques: Verbal cues  Exercises      Shoulder Instructions       General Comments Pt very motivated to work with therapies.    Pertinent Vitals/ Pain       Pain  Assessment Pain Assessment: No/denies pain  Home Living                                          Prior Functioning/Environment              Frequency  Min 2X/week        Progress Toward Goals  OT Goals(current goals can now be found in the care plan section)  Progress towards OT goals: Progressing toward goals  Acute Rehab OT Goals Patient Stated Goal: intensive rehab then home OT Goal Formulation: With patient/family Time For Goal Achievement: 07/31/23 Potential to Achieve Goals: Fair ADL Goals Pt Will Perform Lower Body Dressing: with min assist;with contact guard assist;sit to/from stand Pt Will Transfer to Toilet: with mod assist;squat pivot transfer Pt Will Perform Toileting - Clothing Manipulation and hygiene: with contact guard assist;sit to/from stand  Plan      Co-evaluation                 AM-PAC OT "6 Clicks" Daily Activity     Outcome Measure   Help from another person eating meals?: None Help from another person taking care of personal grooming?: A Little Help from another person toileting, which includes using toliet, bedpan, or urinal?: A Lot Help from another person bathing (including washing, rinsing, drying)?: A Lot Help from another person to put on and taking off regular upper body clothing?: A Little Help from another person to put on and taking off regular lower body clothing?: A Lot 6 Click Score: 16    End of Session Equipment Utilized During Treatment: Oxygen;Gait belt;Other (comment)  OT Visit Diagnosis: Unsteadiness on feet (R26.81);Muscle weakness (generalized) (M62.81);Pain   Activity Tolerance Patient tolerated treatment well;Other (comment) (some SOB, increased O2 to 5L with activity)   Patient Left in chair;with call bell/phone within reach;with chair alarm set   Nurse Communication          Time: [phone] OT Time Calculation (min): 16 min  Charges: OT General Charges $OT Visit: 1 Visit OT  Treatments $Self Care/Home Management : 8-22 mins  Lael Pierce, OT Acute Rehabilitation Services Office: [phone]   Brinton Canavan 07/17/2023, 3:35 PM

## 2023-07-17 NOTE — Progress Notes (Addendum)
 PROGRESS NOTE    John [name]  WUJ:[id number] DOB: 05-Aug-1960 DOA: 07/02/2023 PCP: Patient, No Pcp Per  Chief Complaint  Patient presents with   Back Pain   Leg Pain    Brief Narrative:    63 y.o. 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    He reported back pain for months, but worsened before admission.  He was walking without assistive device before this hospitalization.  [place] few days before the hospitalization, he noticed left leg weakness and pain which has progressed.    He was admitted for intractable back pain, found to have an acute compression fracture of L2.  Now s/p kyphoplasty.  He continues to have left lower extremity weakness.   Assessment & Plan:   Principal Problem:   Acute on chronic respiratory failure with hypoxia (HCC) Active Problems:   Intractable back pain   Pain of metastatic malignancy   S/P kyphoplasty - L2 on 07-08-2023   Stage 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 (HCC)   Hyponatremia   SIADH (syndrome of inappropriate ADH production) (HCC)   PAF (paroxysmal atrial fibrillation) (HCC)   Obesity, Class I, BMI 30-34.9   Need for emotional support   Counseling and coordination of care   Squamous cell carcinoma of lung (HCC)   Medication management   Cancer associated pain   Palliative care encounter   High risk medication use   Drug-induced constipation  Stable for d/c, awaiting CIR.  Intractable Back Pain Left Lower Extremity Pain  Left Lower Extremity Weakness Compression Fracture of L2  Chronic compression Deformities (T12, L1, L3, L4, and L5 Vertebral  Bodies)  S/p L2 kyphoplasty on 5/23 MRI T/L spine showed acute compression fracture involving the inferior endplate of L2.  Also chronic compression deformities of T12, L1, L3, L4, and L5.  Multifactorial degenerative changes L4-5 with resultant moderate canal and left greater than right lateral recess stenosis with mild to moderate bilatera L4 foraminal narrowing.  L foraminal to extraforaminal disc protrusion at L3-4 potentially affecting the exiting L L3 nerve root. Pain in left leg is improved with steroids, ? If this is radicular (related to disc protrusion vs stenosis) Also has significant LLE weakness associated with this - discussed with nsgy (no recommendations for any surgical intervention per discussion with nsgy on call).  He's improving with steroids, will monitor.    Schedule APAP. Fentanyl  patch (will cut back with improvement in pain).  Dilaudid  IV and PO prn.  Lidocaine  patch.  Robaxin .  Steroid taper.   PT/OT - declined by CIR (see 5/27 note from Loye Rumble PT)) Workup additionally as indicated  Acute on Chronic Hypoxic Respiratory Failure Uses 3 L at baseline On 4 L today, wean back to baseline Required up to 15 L HFNC during this hospitalization  CT PE protocol without PE, multifocal areas of GGO throughout both lungs along with peripheral reticular opacities (infectious/inflammatory).  Stable focal airspace opacity in R perihilar region and sperior segment of the RLL with central cavitation.   Cefepime  5/17-5/21 Negative covid, flu, RSV.  Negative urine strep, negative urine legionella, negative MRSA PCR.  Stage IV Squamous Cell Carcinoma of R Lung Following  with Dr. Maria Shiner on gemzar  and navelbine   Atrial Fibrillation Eliquis  Metop  SIADH Stable on salt tabs and fluid restriction  Obesity Body mass index is 32.64 kg/m.    DVT prophylaxis: eliquis  Code Status: full Family Communication: none Disposition:   Status is: Inpatient Remains inpatient  appropriate because: need for ongoing inpatient care   Consultants:  Oncology palliative  Procedures:  L2 kyphoplasty 5/23  Antimicrobials:  Anti-infectives (From admission, onward)    Start     Dose/Rate Route Frequency Ordered Stop   07/07/23 0600  ceFAZolin  (ANCEF ) IVPB 2g/100 mL premix       Note to Pharmacy: Patient is at [facility] and will transfer round-trip to [facility] for procedure.   2 g 200 mL/hr over 30 Minutes Intravenous To Radiology 07/05/23 1734 07/08/23 0753   07/03/23 0800  vancomycin  (VANCOCIN ) IVPB 1000 mg/200 mL premix  Status:  Discontinued        1,000 mg 200 mL/hr over 60 Minutes Intravenous Every 12 hours 07/02/23 2146 07/03/23 0722   07/03/23 0200  ceFEPIme  (MAXIPIME ) 2 g in sodium chloride  0.9 % 100 mL IVPB  Status:  Discontinued        2 g 200 mL/hr over 30 Minutes Intravenous Every 8 hours 07/02/23 2141 07/06/23 1454   07/02/23 1845  vancomycin  (VANCOREADY) IVPB 2000 mg/400 mL        2,000 mg 200 mL/hr over 120 Minutes Intravenous  Once 07/02/23 1813 07/02/23 2120   07/02/23 1815  vancomycin  (VANCOCIN ) IVPB 1000 mg/200 mL premix  Status:  Discontinued        1,000 mg 200 mL/hr over 60 Minutes Intravenous  Once 07/02/23 1807 07/02/23 1813   07/02/23 1815  ceFEPIme  (MAXIPIME ) 2 g in sodium chloride  0.9 % 100 mL IVPB        2 g 200 mL/hr over 30 Minutes Intravenous  Once 07/02/23 1807 07/02/23 1911       Subjective: No new complaints  Objective: Vitals:   07/16/23 2105 07/17/23 0522 07/17/23 0926 07/17/23 0943  BP: 95/61 111/83  111/83  Pulse: 76 89  89  Resp:      Temp: (!) 97.5 F (36.4 C) 98.1 F (36.7 C)    TempSrc: Oral Oral    SpO2: 98% 90% 98%   Weight:      Height:        Intake/Output Summary (Last 24 hours) at 07/17/2023 1157 Last data filed at 07/17/2023 0800 Gross per 24 hour  Intake 900 ml  Output 1700 ml  Net -800 ml   Filed Weights   07/03/23 0155  Weight: 106.1 kg    Examination:  General: No acute  distress. Cardiovascular: RRR. Lungs: unlabored Neurological: improving LLE strength Extremities: No clubbing or cyanosis. No edema.   Data Reviewed: I have personally reviewed following labs and imaging studies  CBC: Recent Labs  Lab 07/12/23 0336 07/13/23 0400 07/14/23 0353 07/15/23 0341 07/16/23 0426  WBC 4.8 5.1 7.5 7.1 7.4  NEUTROABS  --   --  6.1 5.8 6.1  HGB 10.3* 10.8* 10.6* 11.0* 10.7*  HCT 32.0* 34.2* 33.0* 34.9* 33.3*  MCV 95.8 96.6 96.8 97.8 95.7  PLT 266 238 248 248 228    Basic Metabolic Panel: Recent Labs  Lab 07/11/23 0401 07/14/23 0353 07/15/23 0341 07/16/23 0426  NA 132* 132* 134* 133*  K 4.0 3.7 3.6 3.4*  CL 97* 99 103 101  CO2 26 25 25 26   GLUCOSE 114* 108* 89 98  BUN 18 16  16 19  CREATININE 0.76 0.63 0.76 0.80  CALCIUM  8.2* 8.5* 8.3* 8.3*  MG 2.1 2.1 2.1 2.2  PHOS  --  3.3 3.3 3.3    GFR: Estimated Creatinine Clearance: 120.1 mL/min (by C-G formula based on SCr of 0.8 mg/dL).  Liver Function Tests: Recent Labs  Lab 07/13/23 2047 07/14/23 0353 07/15/23 0341 07/16/23 0426  AST  --  27 24 25   ALT  --  35 34 35  ALKPHOS  --  78 73 73  BILITOT  --  1.0 0.9 0.9  PROT  --  5.8* 5.9* 5.9*  ALBUMIN  3.2* 3.3* 3.2* 3.3*    CBG: No results for input(s): "GLUCAP" in the last 168 hours.   No results found for this or any previous visit (from the past 240 hours).       Radiology Studies: No results found.      Scheduled Meds:  acetaminophen   1,000 mg Oral Q8H   amiodarone   200 mg Oral Daily   apixaban   5 mg Oral BID   budesonide -glycopyrrolate -formoterol   2 puff Inhalation BID   feeding supplement  237 mL Oral TID BM   fentaNYL   1 patch Transdermal Q72H   lidocaine   1 patch Transdermal Q24H   metoprolol  succinate  25 mg Oral Daily   pantoprazole   40 mg Oral Daily   PARoxetine   10 mg Oral QHS   polyethylene glycol  17 g Oral Daily   [START ON 07/18/2023] predniSONE   30 mg Oral Q breakfast   Followed by   Cecily Cohen ON  07/20/2023] predniSONE   20 mg Oral Q breakfast   Followed by   Cecily Cohen ON 07/22/2023] predniSONE   10 mg Oral Q breakfast   Followed by   Cecily Cohen ON 07/24/2023] predniSONE   5 mg Oral Q breakfast   senna-docusate  2 tablet Oral BID   sodium chloride   1 g Oral BID WC   Vitamin D  (Ergocalciferol )  50,000 Units Oral Q7 days   Continuous Infusions:   LOS: 15 days    Time spent: over 30 min    Donnetta Gains, MD [facility]   To contact the attending provider between 7A-7P or the covering provider during after hours 7P-7A, please log into the web site [email or web address] and access using universal [place] password for that web site. If you do not have the password, please call the hospital operator.  07/17/2023, 11:57 AM

## 2023-07-17 NOTE — Plan of Care (Signed)
  Problem: Activity: Goal: Risk for activity intolerance will decrease Outcome: Progressing   Problem: Elimination: Goal: Will not experience complications related to bowel motility Outcome: Progressing Goal: Will not experience complications related to urinary retention Outcome: Progressing   Problem: Pain Managment: Goal: General experience of comfort will improve and/or be controlled Outcome: Progressing   Problem: Nutrition: Goal: Adequate nutrition will be maintained Outcome: Adequate for Discharge   Problem: Coping: Goal: Level of anxiety will decrease Outcome: Adequate for Discharge

## 2023-07-17 NOTE — PMR Pre-admission (Signed)
 PMR Admission Coordinator Pre-Admission Assessment  Patient: John [name] is an 63 y.o., male MRN: [id number] DOB: 1960-12-17 Height: 5\' 11"  (180.3 cm) Weight: 106.1 kg              Insurance Information HMO: yes    PPO:      PCP:      IPA:      80/20:      OTHER:  PRIMARY: [employer] Managed      Policy#: [id number]      Subscriber: patient CM Name: Sherrlyn Dolores      Phone#: [phone] ext [phone]      Fax#: [phone] concurrent reviewer is Gorden Latino fax: [phone] Pre-Cert#: [id number] approved for 7 days 6/3 until 6/10     Employer:  Benefits:  Phone #: [phone]     Name:  Eff. Date: 11/15/21-02/15/24     Deduct: $1,500 ($1,500 met)      Out of Pocket Max: $5,000 ($5,000 met)      Life Max: NA  CIR: 70% coverage, 30% co-insurance      SNF: 70% coverage, 30% co-insurance Outpatient: $50 co-pay/visit     Co-Pay:  Home Health: 70% coverage      Co-Pay: 30% co-insurance DME: 70% coverage     Co-Pay: 30% co-insurance Providers: in-network SECONDARY:       Policy#:       Phone#:   Artist:       Phone#:   The Data processing manager" for patients in Inpatient Rehabilitation Facilities with attached "Privacy Act Statement-Health Care Records" was provided and verbally reviewed with: Patient  Emergency Contact Information Contact Information     Name Relation Home Work Mobile   [place] Spouse [phone]  [phone]      Other Contacts   None on File    Current Medical History  Patient Admitting Diagnosis: Left lumbar radiculopathy s/p L2 compression fx with kyphoplasty  History of Present Illness: John [name] is a 63 year old right handed male with history significant for chronic back pain 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 Harvie Liner with past attempts at cardioversion, anxiety/depression.  Per chart review patient lives with spouse.  1 level home 4 steps to entry.  Patient reportedly independent at baseline prior to recent hospitalization.  Presented 07/02/2023 to [facility] with increasing back pain and shortness of breath.  Patient had reported progressive back pain over the past few months radiating to the lower extremities maintained on Percocet 5-3 25 1-2 tabs every 6 hours as needed as well as Lidoderm  patch.    Noted oxygen saturations 88% which improved with 6 L.  Chemistry showed sodium 124 potassium 3.3, chloride 91, total bilirubin 1.6, WBC 1.3, blood cultures no growth to date.  CT angiogram of the chest showed no evidence for pulmonary emboli.  Stable multifocal areas of groundglass opacity throughout both lungs along with some peripheral reticular opacities.  MRI thoracic spine showed no evidence for metastatic disease within the thoracic spine.  Evolving subacute compression fracture involving the superior endplate of T11 with no more than mild residual marrow edema.  Height loss not significantly changed or progressed as compared to  prior MRI 06/06/2023.  Chronic compression deformities involving T12 and L1 vertebral bodies with sequela of prior vertebral augmentation stable.  MRI lumbar spine showed acute compression fractures involving the inferior endplate of L2 with up to 60% height loss without significant bony retropulsion.  Additional chronic compression deformities of all of the T12, L1, L3, L4 and L5 vertebral bodies otherwise stable.  Left foraminal to extraforaminal disc protrusion at L3-4, potentially affecting the exiting left L3 nerve root.  There was a 3.7 cm intra-abdominal aortic aneurysm recommendations  of follow-up every 2 years.  Underwent image guided L2 vertebral augmentation kyphoplasty technique 07/08/2023 per Dr. Blinda Burger of interventional radiology.  Hospital course follow-up oncology services Dr. Maria Shiner in regards to neutropenia he did receive a trial of Neupogen  with latest WBC 7.4.  Patient remains on chronic Eliquis  for history of atrial fibrillation.  Hyponatremia improved 130-134 133 maintained on sodium chloride  tablets.  Palliative care was consulted to establish goals of care.  Pain management with the use of Durogesic patch/lighted Derm patch with Robaxin  as needed and Dilaudid  3 mg every 4 hours breakthrough pain as needed.   Patient's medical record from [facility] has been reviewed by the rehabilitation admission coordinator and physician.  Past Medical History  Past Medical History:  Diagnosis Date   Arthritis    Rheumatoid   Bacteremia due to Pseudomonas    C. difficile colitis    Calculus of GB and bile duct w ac and chr cholecyst w obst 06/08/2023   Chronic anxiety 11/08/2014   Cough 09/22/2021   COVID 2020   mild case   Depression    Drug-induced neutropenia (HCC) 08/11/2020   Family history of adverse reaction to anesthesia    brother with seizures had episode under anesthesia.  Patient has seizures as well.   GERD (gastroesophageal reflux disease)    History of radiation therapy 04/24/2020-05/16/2020   IMRT to right lung     Dr Retta Caster   History of radiation therapy    08/10/21-08/19/21-Dr. Retta Caster   History of radiation therapy    Abdomen- 03/21/23-03/31/23-Dr. Retta Caster   Neutropenia [facility]) 05/12/2020   Neutropenic fever (HCC) 05/12/2020   Normocytic anemia 04/18/2021   PAF (paroxysmal atrial fibrillation) (HCC)    CHADS2VSAC score 3   Rheumatoid aortitis    Secondary hypercoagulable state (HCC) 10/28/2020   Sepsis (HCC) 10/06/2020   Shortness of breath 12/18/2020   Squamous cell lung cancer (HCC)    Has the patient had major  surgery during 100 days prior to admission? Yes  Family History  family history includes Arrhythmia in his mother; Heart disease in his father.  Current Medications   Current Facility-Administered Medications:    [EXPIRED] acetaminophen  (TYLENOL ) tablet 1,000 mg, 1,000 mg, Oral, Q8H **FOLLOWED BY** acetaminophen  (TYLENOL ) tablet 650 mg, 650 mg, Oral, Q6H PRN, Etter Hermann., MD   amiodarone  (PACERONE ) tablet 200 mg, 200 mg, Oral, Daily, [place]ons[place], Prosper M, MD, 200 mg at 07/20/23 0900   apixaban  (ELIQUIS ) tablet 5 mg, 5 mg, Oral, BID, Unk Garb, DO, 5 mg at 07/20/23 0900   benzonatate  (TESSALON ) capsule 200 mg, 200 mg, Oral, TID PRN, Vita Grip, MD   bisacodyl  (DULCOLAX) suppository 10 mg, 10 mg, Rectal, Daily PRN, Ferolito, Michelle Y, NP, 10 mg at 07/06/23 1636   budesonide -glycopyrrolate -formoterol  (BREZTRI ) 160-9-4.8 MCG/ACT inhaler 2 puff, 2 puff, Inhalation, BID, Vita Grip, MD, 2 puff at 07/20/23 0851   feeding supplement (ENSURE ENLIVE / ENSURE PLUS)  liquid 237 mL, 237 mL, Oral, TID BM, Vita Grip, MD, 237 mL at 07/18/23 2137   fentaNYL  (DURAGESIC ) 25 MCG/HR 1 patch, 1 patch, Transdermal, Q72H, Mims, Lauren W, DO, 1 patch at 07/18/23 2136   HYDROmorphone  (DILAUDID ) injection 1 mg, 1 mg, Intravenous, Q4H PRN, Mims, Lauren W, DO, 1 mg at 07/13/23 0530   HYDROmorphone  (DILAUDID ) tablet 3 mg, 3 mg, Oral, Q4H PRN, Ennever, Peter R, MD, 3 mg at 07/19/23 2147   levalbuterol  (XOPENEX ) nebulizer solution 0.63 mg, 0.63 mg, Nebulization, Q8H PRN, Vita Grip, MD   lidocaine  (LIDODERM ) 5 % 1 patch, 1 patch, Transdermal, Q24H, Vita Grip, MD, 1 patch at 07/19/23 2146   methocarbamol  (ROBAXIN ) injection 1,000 mg, 1,000 mg, Intravenous, Q6H PRN, Daren Eck, DO, 1,000 mg at 07/10/23 0912   metoprolol  succinate (TOPROL -XL) 24 hr tablet 25 mg, 25 mg, Oral, Daily, Amponsah, Prosper M, MD, 25 mg at 07/20/23 0900   ondansetron  (ZOFRAN ) tablet 4 mg, 4  mg, Oral, Q6H PRN **OR** ondansetron  (ZOFRAN ) injection 4 mg, 4 mg, Intravenous, Q6H PRN, Yvonne Hering, Annette Killings, MD   Oral care mouth rinse, 15 mL, Mouth Rinse, PRN, Etter Hermann., MD   pantoprazole  (PROTONIX ) EC tablet 40 mg, 40 mg, Oral, Daily, Etter Hermann., MD, 40 mg at 07/20/23 0900   PARoxetine  (PAXIL ) tablet 10 mg, 10 mg, Oral, QHS, Vita Grip, MD, 10 mg at 07/19/23 2146   polyethylene glycol (MIRALAX  / GLYCOLAX ) packet 17 g, 17 g, Oral, Daily, Ferolito, Michelle Y, NP, 17 g at 07/13/23 2131   [COMPLETED] predniSONE  (DELTASONE ) tablet 40 mg, 40 mg, Oral, Q breakfast, 40 mg at 07/17/23 0802 **FOLLOWED BY** [COMPLETED] predniSONE  (DELTASONE ) tablet 30 mg, 30 mg, Oral, Q breakfast, 30 mg at 07/19/23 0832 **FOLLOWED BY** predniSONE  (DELTASONE ) tablet 20 mg, 20 mg, Oral, Q breakfast, 20 mg at 07/20/23 0856 **FOLLOWED BY** [START ON 07/22/2023] predniSONE  (DELTASONE ) tablet 10 mg, 10 mg, Oral, Q breakfast **FOLLOWED BY** [START ON 07/24/2023] predniSONE  (DELTASONE ) tablet 5 mg, 5 mg, Oral, Q breakfast, [place], A Glory Larsen., MD   senna-docusate (Senokot-S) tablet 2 tablet, 2 tablet, Oral, BID, Mims, Lauren W, DO, 2 tablet at 07/20/23 0900   sodium chloride  flush (NS) 0.9 % injection 10-40 mL, 10-40 mL, Intracatheter, PRN, Vita Grip, MD   sodium chloride  tablet 1 g, 1 g, Oral, BID WC, Daren Eck, DO, 1 g at 07/20/23 0856   Vitamin D  (Ergocalciferol ) (DRISDOL ) 1.25 MG (50000 UNIT) capsule 50,000 Units, 50,000 Units, Oral, Q7 days, Etter Hermann., MD, 50,000 Units at 07/13/23 1158  Facility-Administered Medications Ordered in Other Encounters:    ondansetron  (ZOFRAN ) injection 8 mg, 8 mg, Intravenous, Once, Ennever, Sherryll Witten, MD  Patients Current Diet:  Diet Order             Diet regular Room service appropriate? Yes; Fluid consistency: Thin  Diet effective now                  Precautions / Restrictions Precautions Precautions: Fall,  Back Precaution Booklet Issued: Yes (comment) Precaution/Restrictions Comments: VC's for proper tech "log roll". Restrictions Weight Bearing Restrictions Per Provider Order: No Other Position/Activity Restrictions: Left foraminal to extraforaminal disc protrusion at L3-4, potentially affecting the exiting left L3 nerve root   Has the patient had 2 or more falls or a fall with injury in the past year?No  Prior Activity Level Community (5-7x/wk): gets out of house daily; drives  Prior Functional Level  Prior Function Prior Level of Function : Independent/Modified Independent Mobility Comments: uses rw; O2 dependent at 3L. recently needing more assist. (since recent hospital admission - choleycystectomy ADLs Comments: uses O2 at home prn 3 lts, independent pta  Self Care: Did the patient need help bathing, dressing, using the toilet or eating?  Independent  Indoor Mobility: Did the patient need assistance with walking from room to room (with or without device)? Independent  Stairs: Did the patient need assistance with internal or external stairs (with or without device)? Independent  Functional Cognition: Did the patient need help planning regular tasks such as shopping or remembering to take medications? Independent  Patient Information Are you of Hispanic, Latino/a,or Spanish origin?: A. No, not of Hispanic, Latino/a, or Spanish origin What is your race?: A. White Do you need or want an interpreter to communicate with a doctor or health care staff?: 0. No  Patient's Response To:  Health Literacy and Transportation Is the patient able to respond to health literacy and transportation needs?: Yes Health Literacy - How often do you need to have someone help you when you read instructions, pamphlets, or other written material from your doctor or pharmacy?: Never In the past 12 months, has lack of transportation kept you from medical appointments or from getting medications?: No In the  past 12 months, has lack of transportation kept you from meetings, work, or from getting things needed for daily living?: No  Home Assistive Devices / Equipment Home Equipment: Agricultural consultant (2 wheels), BSC/3in1, Information systems manager, Hand held shower head, Wheelchair - manual  Prior Device Use: Indicate devices/aids used by the patient prior to current illness, exacerbation or injury? None of the above  Current Functional Level Cognition  Orientation Level: Oriented X4    Extremity Assessment (includes Sensation/Coordination)  Upper Extremity Assessment: Overall WFL for tasks assessed  Lower Extremity Assessment: Defer to PT evaluation RLE Deficits / Details: MMT ankle 4+/5, knee and hip 4+/5 RLE Sensation: history of peripheral neuropathy RLE Coordination: decreased gross motor LLE Deficits / Details: ankle pf/df 3+/5; knee extension 2+/5, knee flexion 3/5;  hip flexion 2+/5 LLE Coordination: decreased gross motor, decreased fine motor    ADLs  Overall ADL's : Needs assistance/impaired Eating/Feeding: Bed level, Modified independent Grooming: Sitting, Wash/dry face, Oral care Grooming Details (indicate cue type and reason): sitting EOB with increased time. after completion of task patient reported that he had dizziness. BP was checked with 123/90 mmhg. Upper Body Bathing: Moderate assistance, Bed level Lower Body Bathing: Moderate assistance, Bed level Upper Body Dressing : Moderate assistance, Bed level Lower Body Dressing: Moderate assistance, Bed level Lower Body Dressing Details (indicate cue type and reason): patient reported he was unable to figure four either leg. AE bag retrieved only for patient to figure four leg while attempting to get sock aid over R foot. patient was educated that this was used to prevent need to reach feet. patient verbalized understanding reporting that he is unable to reach his LLE like that but then noted to pull it up onto lap with shakiness noted of LLE.  patient again educated on how AE was useful to prevent need to use BUE to maintain figure four position and possible discomfort at back. patient verbalized understanding, Toilet Transfer: Moderate assistance Toilet Transfer Details (indicate cue type and reason): scoot transfer to R side from reclienr to w/c and w/c to bed with increased time. General ADL Comments: Pt able to transfer from EOB to recliner utilizing Steady for additional support  d/t LLE weakness and L knee buckling. Able to simulate walking for a minute or two standing in the Vici prior to transfer to recliner.    Mobility  Overal bed mobility: Needs Assistance Bed Mobility: Rolling, Sidelying to Sit Rolling: Used rails, Supervision Sidelying to sit: Supervision, Used rails Sit to sidelying: Min assist, Used rails, Contact guard assist (for B LE's) General bed mobility comments: Performed without difficulty; good safety and maintenance of back precautions    Transfers  Overall transfer level: Needs assistance Equipment used: Rolling walker (2 wheels) Transfers: Sit to/from Stand, Bed to chair/wheelchair/BSC Sit to Stand: Min assist, +2 physical assistance Bed to/from chair/wheelchair/BSC transfer type:: Step pivot Squat pivot transfers: Contact guard assist Step pivot transfers: Min assist, +2 safety/equipment  Lateral/Scoot Transfers: Contact guard assist Transfer via Lift Equipment: Stedy General transfer comment: STS x 6 during session,good recall on hand placement.  Small steps to chair with min A of 2 for safety    Ambulation / Gait / Stairs / Wheelchair Mobility  Ambulation/Gait Ambulation/Gait assistance: Min assist, +2 safety/equipment Gait Distance (Feet): 30 Feet (30'x2) Assistive device: Rolling walker (2 wheels) Gait Pattern/deviations: Step-to pattern, Decreased stride length, Decreased weight shift to left General Gait Details: Pt taking small steps but no buckling today.  Had chair follow for safety.   Needed cues for rest breaks.  Needing min A for stability Gait velocity: decreased Wheelchair Mobility Wheelchair mobility: Yes Wheelchair propulsion: Both upper extremities, Right lower extremity Wheelchair parts: Needs assistance Distance: 200 Wheelchair Assistance Details (indicate cue type and reason): Unable to actively assist with L LE, cues to initiate brakes prior to transferring from w/c and benefits of frequent rest breaks    Posture / Balance Dynamic Sitting Balance Sitting balance - Comments: Pt tolerated 5+ minutes sitting EOB Balance Overall balance assessment: Needs assistance Sitting-balance support: No upper extremity supported Sitting balance-Leahy Scale: Good Sitting balance - Comments: Pt tolerated 5+ minutes sitting EOB Standing balance support: Bilateral upper extremity supported, Reliant on assistive device for balance Standing balance-Leahy Scale: Poor Standing balance comment: RW and min A    Special needs/care consideration Skin Ecchymosis: arm/bilateral; Erythema/Redness: abdomen, anus, arm, hand, scrotum/bilateral; Incision: puncture-vertebral column/lower, mid and Oxygen: 4L nasal cannula    Previous Home Environment  Living Arrangements: Spouse/significant other  Lives With: Spouse Available Help at Discharge: Family, Friend(s), Available 24 hours/day Type of Home: House Home Layout: One level Home Access: Ramped entrance Entrance Stairs-Rails: Left Entrance Stairs-Number of Steps: 4 Bathroom Shower/Tub: Health visitor: Handicapped height Bathroom Accessibility: Yes How Accessible: Accessible via wheelchair, Accessible via walker Home Care Services: No Additional Comments: works from home and wife able to work from home  Discharge Living Setting Plans for Discharge Living Setting: Patient's home Type of Home at Discharge: House Discharge Home Layout: One level Discharge Home Access: Ramped entrance Discharge Bathroom  Shower/Tub: Walk-in shower Discharge Bathroom Toilet: Handicapped height Discharge Bathroom Accessibility: Yes How Accessible: Accessible via wheelchair, Accessible via walker Does the patient have any problems obtaining your medications?: No  Social/Family/Support Systems Anticipated Caregiver: Cortlan Dolin (wife) and family/friends Anticipated Caregiver's Contact Information: [phone] Caregiver Availability: 24/7 Discharge Plan Discussed with Primary Caregiver: Yes Is Caregiver In Agreement with Plan?: Yes Does Caregiver/Family have Issues with Lodging/Transportation while Pt is in Rehab?: No  Goals Patient/Family Goal for Rehab: Mod I: PT/OT Expected length of stay: 7-10 days Pt/Family Agrees to Admission and willing to participate: Yes Program Orientation Provided & Reviewed with Pt/Caregiver Including Roles  &  Responsibilities: Yes  Decrease burden of Care through IP rehab admission: NA  Possible need for SNF placement upon discharge:Not anticipated  Patient Condition: This patient's condition remains as documented in the consult dated 07/16/23, in which the Rehabilitation Physician determined and documented that the patient's condition is appropriate for intensive rehabilitative care in an inpatient rehabilitation facility. Will admit to inpatient rehab today.  Preadmission Screen Completed By:  Amiel Kalata, CCC-SLP, 07/20/2023 9:48 AM ______________________________________________________________________   Discussed status with Dr. Alessandra Ancona on6/4/25 at 0900 and received approval for admission today.  Admission Coordinator:  Amiel Kalata, time 9:48 am/Date 07/20/23

## 2023-07-18 DIAGNOSIS — J9621 Acute and chronic respiratory failure with hypoxia: Secondary | ICD-10-CM | POA: Diagnosis not present

## 2023-07-18 NOTE — Progress Notes (Signed)
 Over the weekend, I do not think there were really any problems.  I know that he was worked on by occupational therapy and physical therapy.  Looks like we are going to try to get him into CIR.  I think this is an excellent idea.  I think he would benefit from the rehabilitation.  He says he did not  take anything pain medicine wise over the weekend.  I am happy about this.  He has had a good appetite.  He has had no nausea or vomiting.  He is going to the bathroom.  He is having no problems with cough.  There may be a little bit of shortness of breath.  He is on some inhalers.  He has had no bleeding.  Labs are pending.  The last labs that were done are back on Saturday.  That time, everything looked okay.  Potassium was 3.4.  His white cell count was 7.4.  Hemoglobin 10.7.  Platelet count 228,000.   His vital signs show temperature of 98.3.  Pulse 73.  Blood pressure 97/68.  His head and neck exam shows no ocular or oral lesions.  He has no palpable cervical or supraclavicular lymph nodes.  Lungs are clear bilaterally.  Cardiac exam regular rate and rhythm.  He has an occasional extra beat.  Abdomen soft.  Bowel sounds are present.  He has no guarding or rebound tenderness.  There is no fluid wave.  Extremities shows improved strength in the left hip area.   Hopefully, he will be able to go to CIR today.  I think this would be the next idea for him.  Again, this is all about quality of life.  I appreciate everybody's help on 4 W.   Rayleen Cal, MD  Deuteronomy 31:8

## 2023-07-18 NOTE — Progress Notes (Signed)
 Physical Therapy Treatment Patient Details Name: John [name] MRN: [id number] DOB: Aug 03, 1960 Today's Date: 07/18/2023   History of Present Illness Pt is 63 yo male who presented to the ED on 07/02/23 for evaluation of worsening back pain, L LE weakness, and shortness of breath. Pt found to have L2 compression fx and is s/p KP L2 on  5/23. Pt also wtih acute on chronic resp failure. 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    PT Comments  Pt very motivated and tolerating therapy well.  He does need rest breaks for o2 sats to recover.  Pt performed multiple STS, transfers, ambulated 10' with RW, and performed exercises for LE strengthening including mini-squats.  He does need assist of 2 for safety (chair follow) due to L LE weakness/buckling.  Only had 1 episode of buckling this session (noted RW too tall) but when RW lowered much improved and reports feels much stronger, confident.  Continue POC and recommendation Patient will benefit from intensive inpatient follow-up therapy, >3 hours/day.   If plan is discharge home, recommend the following: A lot of help with walking and/or transfers;Assistance with cooking/housework;Help with stairs or ramp for entrance;Assist for transportation   Can travel by private vehicle     No  Equipment Recommendations  Wheelchair (measurements PT);Wheelchair cushion (measurements PT);Other (comment) (if d/c home will need 18" w/c, cushion, elevating leg rests, and swing away arm rests)    Recommendations for Other Services       Precautions / Restrictions Precautions Precautions: Fall;Back Precaution Booklet Issued: Yes (comment) (back precautions)     Mobility  Bed  Mobility Overal bed mobility: Needs Assistance Bed Mobility: Rolling, Sidelying to Sit Rolling: Used rails, Supervision Sidelying to sit: Supervision, Used rails       General bed mobility comments: Performed without difficulty; good safety and maintenance of back precautions    Transfers Overall transfer level: Needs assistance Equipment used: Rolling walker (2 wheels) Transfers: Sit to/from Stand, Bed to chair/wheelchair/BSC Sit to Stand: Min assist, +2 physical assistance   Step pivot transfers: Min assist, +2 safety/equipment       General transfer comment: STS x 6 during session, cues to push up from chair/bed.  Small steps to chair with min A of 2 for safety due to L knee buckling at times (see note in gait)    Ambulation/Gait Ambulation/Gait assistance: Min assist, +2 safety/equipment Gait Distance (Feet): 10 Feet Assistive device: Rolling walker (2 wheels) Gait Pattern/deviations: Step-to pattern, Decreased stride length, Decreased weight shift to left Gait velocity: decreased     General Gait Details: Started wtih marching in place.  On first bout, marched in place for 15 sec and L knee buckled and pt sat abruptly back on bed.  Noted that walker was too high.  Lowered walker 2 notches.  On next bout, pt reports feels much better and more confident.  This time took small steps to chair.  Rest break then stood again and marched in place 30 sec.  Rest break then performed some exercises (see below).  Then rest break followed by 10' ambulation.  Min A plus chair follow, cues for small steps and step to L gait for safety.  Pt had no further knee buckling.   Stairs  Wheelchair Mobility     Tilt Bed    Modified Rankin (Stroke Patients Only)       Balance Overall balance assessment: Needs assistance Sitting-balance support: No upper extremity supported Sitting balance-Leahy Scale: Good     Standing balance support: Bilateral upper extremity  supported, Reliant on assistive device for balance Standing balance-Leahy Scale: Poor Standing balance comment: RW and min A                            Communication    Cognition Arousal: Alert Behavior During Therapy: WFL for tasks assessed/performed   PT - Cognitive impairments: No apparent impairments                       PT - Cognition Comments: Very pleasant and motivated.  Does get anxious at times but is aware and self cues to breath and relax        Cueing    Exercises General Exercises - Lower Extremity Ankle Circles/Pumps: AROM, Both, 10 reps Long Arc Quad: AROM, Both, Seated, 15 reps, Limitations Long Arc Quad Limitations: Cues for full controlled ROM Hip Flexion/Marching: AROM, Both, 10 reps, Seated, Limitations Hip Flexion/Marching Limitations: at times providing AAROM on L Other Exercises Other Exercises: Mini-squats with RW , CGA, L knee blocked but did not buckle.  Started with 5 reps and on second bout did 13 reps    General Comments General comments (skin integrity, edema, etc.): Pt on 4 L O2 with sats 94% rest.  On 4 L dropping to 86% with activity and slow recovery.  Increased to 6 L for activity.  Would still drop to 86% but recovered to 90% in ~30 sec-1 min.  Required frequent rest breaks.      Pertinent Vitals/Pain Pain Assessment Pain Assessment: Faces Faces Pain Scale: Hurts a little bit Pain Location: back Pain Descriptors / Indicators: Discomfort Pain Intervention(s): Limited activity within patient's tolerance, Monitored during session    Home Living                          Prior Function            PT Goals (current goals can now be found in the care plan section) Progress towards PT goals: Progressing toward goals    Frequency    Min 3X/week      PT Plan      Co-evaluation              AM-PAC PT "6 Clicks" Mobility   Outcome Measure  Help needed turning from your back to your side  while in a flat bed without using bedrails?: A Little Help needed moving from lying on your back to sitting on the side of a flat bed without using bedrails?: A Little Help needed moving to and from a bed to a chair (including a wheelchair)?: A Lot Help needed standing up from a chair using your arms (e.g., wheelchair or bedside chair)?: A Lot Help needed to walk in hospital room?: Total Help needed climbing 3-5 steps with a railing? : Total 6 Click Score: 12    End of Session Equipment Utilized During Treatment: Gait belt;Oxygen Activity Tolerance: Patient tolerated treatment well Patient left: in chair;with call bell/phone within reach;with chair alarm set Nurse Communication: Mobility status PT Visit Diagnosis: Other abnormalities of gait and mobility (R26.89);History of falling (Z91.81);Muscle weakness (generalized) (M62.81)  Time: 1223-1254 PT Time Calculation (min) (ACUTE ONLY): 31 min  Charges:    $Therapeutic Exercise: 8-22 mins $Therapeutic Activity: 8-22 mins PT General Charges $$ ACUTE PT VISIT: 1 Visit                     Cyd Dowse, PT Acute Rehab [facility] [phone]    Carolynn Citrin 07/18/2023, 1:56 PM

## 2023-07-18 NOTE — Progress Notes (Signed)
 PROGRESS NOTE    John [name]  GNF:[id number] DOB: [birth year] DOA: 07/02/2023 PCP: Patient, No Pcp Per  Chief Complaint  Patient presents with   Back Pain   Leg Pain    Brief Narrative:    63 y.o. 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    He reported back pain for months, but worsened before admission.  He was walking without assistive device before this hospitalization.  Graysyn Bache few days before the hospitalization, he noticed left leg weakness and pain which has progressed.    He was admitted for intractable back pain, found to have an acute compression fracture of L2.  Now s/p kyphoplasty.  He continues to have left lower extremity weakness.   Assessment & Plan:   Principal Problem:   Acute on chronic respiratory failure with hypoxia (HCC) Active Problems:   Intractable back pain   Pain of metastatic malignancy   S/P kyphoplasty - L2 on 07-08-2023   Stage 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 (HCC)   Hyponatremia   SIADH (syndrome of inappropriate ADH production) (HCC)   PAF (paroxysmal atrial fibrillation) (HCC)   Obesity, Class I, BMI 30-34.9   Need for emotional support   Counseling and coordination of care   Squamous cell carcinoma of lung (HCC)   Medication management   Cancer associated pain   Palliative care encounter   High risk medication use   Drug-induced constipation  Stable for d/c, awaiting CIR.  Intractable Back Pain Left Lower Extremity Pain  Left Lower Extremity Weakness Compression Fracture of L2  Chronic compression Deformities (T12, L1, L3, L4, and L5 Vertebral  Bodies)  S/p L2 kyphoplasty on 5/23 MRI T/L spine showed acute compression fracture involving the inferior endplate of L2.  Also chronic compression deformities of T12, L1, L3, L4, and L5.  Multifactorial degenerative changes L4-5 with resultant moderate canal and left greater than right lateral recess stenosis with mild to moderate bilatera L4 foraminal narrowing.  L foraminal to extraforaminal disc protrusion at L3-4 potentially affecting the exiting L L3 nerve root. Pain in left leg is improved with steroids, ? If this is radicular (related to disc protrusion vs stenosis) Also has significant LLE weakness associated with this - discussed with nsgy (no recommendations for any surgical intervention per discussion with nsgy on call).  He's improving with steroids, will monitor.    Schedule APAP. Fentanyl  patch (will cut back with improvement in pain).  Dilaudid  IV and PO prn.  Lidocaine  patch.  Robaxin .  Steroid taper.   PT/OT - declined by CIR (see 5/27 note from Loye Rumble PT)) Workup additionally as indicated  Acute on Chronic Hypoxic Respiratory Failure Uses 3 L at baseline On 4 L today, wean back to baseline Required up to 15 L HFNC during this hospitalization  CT PE protocol without PE, multifocal areas of GGO throughout both lungs along with peripheral reticular opacities (infectious/inflammatory).  Stable focal airspace opacity in R perihilar region and sperior segment of the RLL with central cavitation.   Cefepime  5/17-5/21 Negative covid, flu, RSV.  Negative urine strep, negative urine legionella, negative MRSA PCR.  Stage IV Squamous Cell Carcinoma of R Lung Following  with Dr. Maria Shiner on gemzar  and navelbine   Atrial Fibrillation Eliquis  Metop  SIADH Stable on salt tabs and fluid restriction  Obesity Body mass index is 32.64 kg/m.    DVT prophylaxis: eliquis  Code Status: full Family Communication: none Disposition:   Status is: Inpatient Remains inpatient  appropriate because: need for ongoing inpatient care   Consultants:  Oncology palliative  Procedures:  L2 kyphoplasty 5/23  Antimicrobials:  Anti-infectives (From admission, onward)    Start     Dose/Rate Route Frequency Ordered Stop   07/07/23 0600  ceFAZolin  (ANCEF ) IVPB 2g/100 mL premix       Note to Pharmacy: Patient is at [facility] and will transfer round-trip to [facility] for procedure.   2 g 200 mL/hr over 30 Minutes Intravenous To Radiology 07/05/23 1734 07/08/23 0753   07/03/23 0800  vancomycin  (VANCOCIN ) IVPB 1000 mg/200 mL premix  Status:  Discontinued        1,000 mg 200 mL/hr over 60 Minutes Intravenous Every 12 hours 07/02/23 2146 07/03/23 0722   07/03/23 0200  ceFEPIme  (MAXIPIME ) 2 g in sodium chloride  0.9 % 100 mL IVPB  Status:  Discontinued        2 g 200 mL/hr over 30 Minutes Intravenous Every 8 hours 07/02/23 2141 07/06/23 1454   07/02/23 1845  vancomycin  (VANCOREADY) IVPB 2000 mg/400 mL        2,000 mg 200 mL/hr over 120 Minutes Intravenous  Once 07/02/23 1813 07/02/23 2120   07/02/23 1815  vancomycin  (VANCOCIN ) IVPB 1000 mg/200 mL premix  Status:  Discontinued        1,000 mg 200 mL/hr over 60 Minutes Intravenous  Once 07/02/23 1807 07/02/23 1813   07/02/23 1815  ceFEPIme  (MAXIPIME ) 2 g in sodium chloride  0.9 % 100 mL IVPB        2 g 200 mL/hr over 30 Minutes Intravenous  Once 07/02/23 1807 07/02/23 1911       Subjective: No complaints  Objective: Vitals:   07/18/23 0526 07/18/23 0600 07/18/23 0853 07/18/23 1340  BP: 97/68  97/68 136/79  Pulse: 73  73 85  Resp: 16   16  Temp: 98.3 F (36.8 C)   98.1 F (36.7 C)  TempSrc:    Oral  SpO2: 98% 99%  95%  Weight:      Height:       No intake or output data in the 24 hours ending 07/18/23 1348  Filed Weights   07/03/23 0155  Weight: 106.1 kg    Examination:  General: No acute distress. Cardiovascular: RRR Lungs: unlabored Neurological: continuing improvement in LLE weakness Extremities: No clubbing  or cyanosis. No edema. Data Reviewed: I have personally reviewed following labs and imaging studies  CBC: Recent Labs  Lab 07/12/23 0336 07/13/23 0400 07/14/23 0353 07/15/23 0341 07/16/23 0426  WBC 4.8 5.1 7.5 7.1 7.4  NEUTROABS  --   --  6.1 5.8 6.1  HGB 10.3* 10.8* 10.6* 11.0* 10.7*  HCT 32.0* 34.2* 33.0* 34.9* 33.3*  MCV 95.8 96.6 96.8 97.8 95.7  PLT 266 238 248 248 228    Basic Metabolic Panel: Recent Labs  Lab 07/14/23 0353 07/15/23 0341 07/16/23 0426  NA 132* 134* 133*  K 3.7 3.6 3.4*  CL 99 103 101  CO2 25 25 26   GLUCOSE 108* 89 98  BUN 16 16 19   CREATININE 0.63 0.76 0.80  CALCIUM  8.5* 8.3* 8.3*  MG 2.1 2.1 2.2  PHOS 3.3 3.3 3.3    GFR: Estimated Creatinine Clearance: 120.1  mL/min (by C-G formula based on SCr of 0.8 mg/dL).  Liver Function Tests: Recent Labs  Lab 07/13/23 2047 07/14/23 0353 07/15/23 0341 07/16/23 0426  AST  --  27 24 25   ALT  --  35 34 35  ALKPHOS  --  78 73 73  BILITOT  --  1.0 0.9 0.9  PROT  --  5.8* 5.9* 5.9*  ALBUMIN  3.2* 3.3* 3.2* 3.3*    CBG: No results for input(s): "GLUCAP" in the last 168 hours.   No results found for this or any previous visit (from the past 240 hours).       Radiology Studies: No results found.      Scheduled Meds:  acetaminophen   1,000 mg Oral Q8H   amiodarone   200 mg Oral Daily   apixaban   5 mg Oral BID   budesonide -glycopyrrolate -formoterol   2 puff Inhalation BID   feeding supplement  237 mL Oral TID BM   fentaNYL   1 patch Transdermal Q72H   lidocaine   1 patch Transdermal Q24H   metoprolol  succinate  25 mg Oral Daily   pantoprazole   40 mg Oral Daily   PARoxetine   10 mg Oral QHS   polyethylene glycol  17 g Oral Daily   predniSONE   30 mg Oral Q breakfast   Followed by   Cecily Cohen ON 07/20/2023] predniSONE   20 mg Oral Q breakfast   Followed by   Cecily Cohen ON 07/22/2023] predniSONE   10 mg Oral Q breakfast   Followed by   Cecily Cohen ON 07/24/2023] predniSONE   5 mg Oral Q breakfast    senna-docusate  2 tablet Oral BID   sodium chloride   1 g Oral BID WC   Vitamin D  (Ergocalciferol )  50,000 Units Oral Q7 days   Continuous Infusions:   LOS: 16 days    Time spent: over 30 min    Donnetta Gains, MD [facility]   To contact the attending provider between 7A-7P or the covering provider during after hours 7P-7A, please log into the web site [email or web address] and access using universal [place] password for that web site. If you do not have the password, please call the hospital operator.  07/18/2023, 1:48 PM

## 2023-07-18 NOTE — Plan of Care (Signed)
   Problem: Activity: Goal: Risk for activity intolerance will decrease Outcome: Progressing   Problem: Coping: Goal: Level of anxiety will decrease Outcome: Progressing   Problem: Safety: Goal: Ability to remain free from injury will improve Outcome: Progressing

## 2023-07-18 NOTE — Progress Notes (Signed)
Inpatient Rehab Admissions Coordinator:  ?Insurance authorization started. Will continue to follow. ? ? ?Adela Esteban Graves Madden, MS, CCC-SLP ?Admissions Coordinator ?[phone] ? ?

## 2023-07-19 DIAGNOSIS — J9621 Acute and chronic respiratory failure with hypoxia: Secondary | ICD-10-CM | POA: Diagnosis not present

## 2023-07-19 MED ORDER — ORAL CARE MOUTH RINSE
15.0000 mL | OROMUCOSAL | Status: DC | PRN
Start: 2023-07-19 — End: 2023-07-20

## 2023-07-19 NOTE — Progress Notes (Addendum)
 PROGRESS NOTE    John [name]  ZOX:[id number] DOB: 15-Sep-1960 DOA: 07/02/2023 PCP: Patient, No Pcp Per  Chief Complaint  Patient presents with   Back Pain   Leg Pain    Brief Narrative:    63 y.o. 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    He reported back pain for months, but worsened before admission.  He was walking without assistive device before this hospitalization.  John [name] few days before the hospitalization, he noticed left leg weakness and pain which has progressed.    He was admitted for intractable back pain, found to have an acute compression fracture of L2.  Now s/p kyphoplasty.    After the procedure, he had left lower extremity pain and weakness.  This has improved with steroids.    Now awaiting CIR, insurance authorization started.    Assessment & Plan:   Principal Problem:   Acute on chronic respiratory failure with hypoxia (HCC) Active Problems:   Intractable back pain   Pain of metastatic malignancy   S/P kyphoplasty - L2 on 07-08-2023   Stage 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 (HCC)   Hyponatremia   SIADH (syndrome of inappropriate ADH production) (HCC)   PAF (paroxysmal atrial fibrillation) (HCC)   Obesity, Class I, BMI 30-34.9   Need for emotional support   Counseling and coordination of care   Squamous cell carcinoma of lung (HCC)   Medication management   Cancer associated pain   Palliative care encounter   High risk medication use   Drug-induced constipation  Stable for d/c, awaiting CIR.  Intractable Back Pain Left Lower Extremity Pain  Left Lower Extremity  Weakness Compression Fracture of L2  Chronic compression Deformities (T12, L1, L3, L4, and L5 Vertebral Bodies)  S/p L2 kyphoplasty on 5/23 MRI T/L spine showed acute compression fracture involving the inferior endplate of L2.  Also chronic compression deformities of T12, L1, L3, L4, and L5.  Multifactorial degenerative changes L4-5 with resultant moderate canal and left greater than right lateral recess stenosis with mild to moderate bilatera L4 foraminal narrowing.  L foraminal to extraforaminal disc protrusion at L3-4 potentially affecting the exiting L L3 nerve root. Pain in left leg is improved with steroids, ? If this is radicular (related to disc protrusion vs stenosis) Also has significant LLE weakness associated with this - discussed with nsgy (no recommendations for any surgical intervention per discussion with nsgy on call).  He's improving with steroids, will monitor.    Schedule APAP. Fentanyl  patch (will cut back with improvement in pain).  Dilaudid  IV and PO prn.  Lidocaine  patch.  Robaxin .  Steroid taper.   PT/OT - declined by CIR (see 5/27 note from John [name] PT)) Workup additionally as indicated  Acute on Chronic Hypoxic Respiratory Failure Uses 3 L at baseline On 4 L today, wean back to baseline Required up to 15 L HFNC during this hospitalization  CT PE protocol without PE, multifocal areas of GGO throughout both lungs along with peripheral reticular opacities (infectious/inflammatory).  Stable focal airspace opacity in R perihilar region and sperior segment of the RLL with central cavitation.   Cefepime  5/17-5/21 Negative covid, flu,  RSV.  Negative urine strep, negative urine legionella, negative MRSA PCR.  Stage IV Squamous Cell Carcinoma of R Lung Following with Dr. Maria [name] on gemzar  and navelbine   Atrial Fibrillation Eliquis  Metop  SIADH Stable on salt tabs and fluid restriction  Obesity Body mass index is 32.64 kg/m.    DVT prophylaxis: eliquis  Code  Status: full Family Communication: none Disposition:   Status is: Inpatient Remains inpatient appropriate because: need for ongoing inpatient care   Consultants:  Oncology palliative  Procedures:  L2 kyphoplasty 5/23  Antimicrobials:  Anti-infectives (From admission, onward)    Start     Dose/Rate Route Frequency Ordered Stop   07/07/23 0600  ceFAZolin  (ANCEF ) IVPB 2g/100 mL premix       Note to Pharmacy: Patient is at [facility] and will transfer round-trip to [facility] for procedure.   2 g 200 mL/hr over 30 Minutes Intravenous To Radiology 07/05/23 1734 07/08/23 0753   07/03/23 0800  vancomycin  (VANCOCIN ) IVPB 1000 mg/200 mL premix  Status:  Discontinued        1,000 mg 200 mL/hr over 60 Minutes Intravenous Every 12 hours 07/02/23 2146 07/03/23 0722   07/03/23 0200  ceFEPIme  (MAXIPIME ) 2 g in sodium chloride  0.9 % 100 mL IVPB  Status:  Discontinued        2 g 200 mL/hr over 30 Minutes Intravenous Every 8 hours 07/02/23 2141 07/06/23 1454   07/02/23 1845  vancomycin  (VANCOREADY) IVPB 2000 mg/400 mL        2,000 mg 200 mL/hr over 120 Minutes Intravenous  Once 07/02/23 1813 07/02/23 2120   07/02/23 1815  vancomycin  (VANCOCIN ) IVPB 1000 mg/200 mL premix  Status:  Discontinued        1,000 mg 200 mL/hr over 60 Minutes Intravenous  Once 07/02/23 1807 07/02/23 1813   07/02/23 1815  ceFEPIme  (MAXIPIME ) 2 g in sodium chloride  0.9 % 100 mL IVPB        2 g 200 mL/hr over 30 Minutes Intravenous  Once 07/02/23 1807 07/02/23 1911       Subjective: No complaints   Objective: Vitals:   07/19/23 0755 07/19/23 1005 07/19/23 1250 07/19/23 1320  BP:  112/68 (!) 149/127 120/80  Pulse:  83 83 91  Resp:   18   Temp:   97.6 F (36.4 C)   TempSrc:   Oral   SpO2: 93%  92% 93%  Weight:      Height:        Intake/Output Summary (Last 24 hours) at 07/19/2023 1339 Last data filed at 07/19/2023 1324 Gross per 24 hour  Intake 600 ml  Output 675 ml  Net -75 ml    Filed Weights   07/03/23 0155   Weight: 106.1 kg    Examination:  General: No acute distress. Cardiovascular: RRR Lungs: unlabored Neurological:improving LLE strength Extremities: No clubbing or cyanosis. No edema.  Data Reviewed: I have personally reviewed following labs and imaging studies  CBC: Recent Labs  Lab 07/13/23 0400 07/14/23 0353 07/15/23 0341 07/16/23 0426  WBC 5.1 7.5 7.1 7.4  NEUTROABS  --  6.1 5.8 6.1  HGB 10.8* 10.6* 11.0* 10.7*  HCT 34.2* 33.0* 34.9* 33.3*  MCV 96.6 96.8 97.8 95.7  PLT 238 248 248 228    Basic Metabolic Panel: Recent Labs  Lab 07/14/23 0353 07/15/23 0341 07/16/23 0426  NA 132* 134* 133*  K 3.7 3.6 3.4*  CL 99 103 101  CO2 25 25 26   GLUCOSE 108* 89 98  BUN 16 16  19  CREATININE 0.63 0.76 0.80  CALCIUM  8.5* 8.3* 8.3*  MG 2.1 2.1 2.2  PHOS 3.3 3.3 3.3    GFR: Estimated Creatinine Clearance: 120.1 mL/min (by C-G formula based on SCr of 0.8 mg/dL).  Liver Function Tests: Recent Labs  Lab 07/13/23 2047 07/14/23 0353 07/15/23 0341 07/16/23 0426  AST  --  27 24 25   ALT  --  35 34 35  ALKPHOS  --  78 73 73  BILITOT  --  1.0 0.9 0.9  PROT  --  5.8* 5.9* 5.9*  ALBUMIN  3.2* 3.3* 3.2* 3.3*    CBG: No results for input(s): "GLUCAP" in the last 168 hours.   No results found for this or any previous visit (from the past 240 hours).       Radiology Studies: No results found.      Scheduled Meds:  amiodarone   200 mg Oral Daily   apixaban   5 mg Oral BID   budesonide -glycopyrrolate -formoterol   2 puff Inhalation BID   feeding supplement  237 mL Oral TID BM   fentaNYL   1 patch Transdermal Q72H   lidocaine   1 patch Transdermal Q24H   metoprolol  succinate  25 mg Oral Daily   pantoprazole   40 mg Oral Daily   PARoxetine   10 mg Oral QHS   polyethylene glycol  17 g Oral Daily   [START ON 07/20/2023] predniSONE   20 mg Oral Q breakfast   Followed by   Cecily Cohen ON 07/22/2023] predniSONE   10 mg Oral Q breakfast   Followed by   Cecily Cohen ON 07/24/2023]  predniSONE   5 mg Oral Q breakfast   senna-docusate  2 tablet Oral BID   sodium chloride   1 g Oral BID WC   Vitamin D  (Ergocalciferol )  50,000 Units Oral Q7 days   Continuous Infusions:   LOS: 17 days    Time spent: over 30 min    Donnetta Gains, MD [facility]   To contact the attending provider between 7A-7P or the covering provider during after hours 7P-7A, please log into the web site [email or web address] and access using universal [place] password for that web site. If you do not have the password, please call the hospital operator.  07/19/2023, 1:39 PM

## 2023-07-19 NOTE — Progress Notes (Signed)
 Physical Therapy Treatment Patient Details Name: John [name] MRN: [id number] DOB: 28-Apr-1960 Today's Date: 07/19/2023   History of Present Illness Pt is 63 yo male who presented to the ED on 07/02/23 for evaluation of worsening back pain, L LE weakness, and shortness of breath. Pt found to have L2 compression fx and is s/p KP L2 on  5/23. Pt also wtih acute on chronic resp failure. 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    PT Comments  Pt continues to make gradual progress.  He participated in multiple exercises, transfers, and 2 bouts of 30' ambulation.  Required frequent rest breaks with increased time for O2 recovery.  Did place on 6 L O2 for activity and on 4 L O2 at rest.  Pt very motivated and showing good improvement.     If plan is discharge home, recommend the following: A lot of help with walking and/or transfers;Assistance with cooking/housework;Help with stairs or ramp for entrance;Assist for transportation   Can travel by private vehicle     No  Equipment Recommendations  Wheelchair (measurements PT);Wheelchair cushion (measurements PT);Other (comment) (if d/c home will need 18" w/c, cushion, elevating leg rests, and swing away arm rests)    Recommendations for Other Services       Precautions / Restrictions Precautions Precautions: Fall;Back Precaution Booklet Issued: Yes (comment) Recall of Precautions/Restrictions: Intact     Mobility  Bed Mobility Overal bed mobility: Needs Assistance Bed Mobility: Rolling, Sidelying to Sit Rolling: Used rails, Supervision Sidelying to sit: Supervision, Used rails       General bed mobility comments: Performed without difficulty; good safety and  maintenance of back precautions    Transfers Overall transfer level: Needs assistance Equipment used: Rolling walker (2 wheels) Transfers: Sit to/from Stand, Bed to chair/wheelchair/BSC Sit to Stand: Min assist, +2 physical assistance   Step pivot transfers: Min assist, +2 safety/equipment       General transfer comment: STS x 6 during session,good recall on hand placement.  Small steps to chair with min A of 2 for safety    Ambulation/Gait Ambulation/Gait assistance: Min assist, +2 safety/equipment Gait Distance (Feet): 30 Feet (30'x2) Assistive device: Rolling walker (2 wheels) Gait Pattern/deviations: Step-to pattern, Decreased stride length, Decreased weight shift to left Gait velocity: decreased     General Gait Details: Pt taking small steps but no buckling today.  Had chair follow for safety.  Needed cues for rest breaks.  Needing min A for stability   Stairs             Wheelchair Mobility     Tilt Bed    Modified Rankin (Stroke Patients Only)       Balance Overall balance assessment: Needs assistance Sitting-balance support: No upper extremity supported Sitting balance-Leahy Scale: Good     Standing balance support: Bilateral upper extremity supported, Reliant on assistive device for balance Standing balance-Leahy Scale: Poor Standing balance comment: RW and min A                            Communication    Cognition Arousal: Alert Behavior During Therapy: WFL for tasks assessed/performed   PT - Cognitive impairments: No apparent impairments  PT - Cognition Comments: Very pleasant and motivated.  Does get anxious at times but is aware and self cues to breath and relax        Cueing    Exercises Other Exercises Other Exercises: Mini-squats with RW , CGA.  10x2; cues for correct form    General Comments General comments (skin integrity, edema, etc.): Pt on 4 L O2 rest with sats 95%.  Placed on 6  L for activity.  Pt taking rest breaks after each bout of activity or transfers.  Sats decreased to 86% each time after transfers, mini-squats, and first bout of walking taking 1-2 mins to recover to 90% and an additional 1-2 minutes for pt to recover. Cues for breathing technique.  On last bout of ambulation decreased to 82% and requiring increased time (>4 mins) to recover so held further activity.  Sats up to 90% end of session      Pertinent Vitals/Pain Pain Assessment Pain Assessment: 0-10 Pain Score: 4  Pain Location: back, L LE Pain Descriptors / Indicators: Throbbing Pain Intervention(s): Limited activity within patient's tolerance, Monitored during session, Premedicated before session, Repositioned    Home Living                          Prior Function            PT Goals (current goals can now be found in the care plan section) Progress towards PT goals: Progressing toward goals    Frequency    Min 3X/week      PT Plan      Co-evaluation              AM-PAC PT "6 Clicks" Mobility   Outcome Measure  Help needed turning from your back to your side while in a flat bed without using bedrails?: A Little Help needed moving from lying on your back to sitting on the side of a flat bed without using bedrails?: A Little Help needed moving to and from a bed to a chair (including a wheelchair)?: A Lot Help needed standing up from a chair using your arms (e.g., wheelchair or bedside chair)?: A Lot Help needed to walk in hospital room?: A Lot Help needed climbing 3-5 steps with a railing? : Total 6 Click Score: 13    End of Session Equipment Utilized During Treatment: Gait belt;Oxygen Activity Tolerance: Patient tolerated treatment well Patient left: in chair;with call bell/phone within reach;with chair alarm set Nurse Communication: Mobility status PT Visit Diagnosis: Other abnormalities of gait and mobility (R26.89);History of falling (Z91.81);Muscle  weakness (generalized) (M62.81)     Time: 1610-9604 PT Time Calculation (min) (ACUTE ONLY): 27 min  Charges:    $Gait Training: 8-22 mins $Therapeutic Activity: 8-22 mins PT General Charges $$ ACUTE PT VISIT: 1 Visit                     Cyd Dowse, PT Acute Rehab [facility] [phone]    Carolynn Citrin 07/19/2023, 1:41 PM

## 2023-07-19 NOTE — Plan of Care (Signed)

## 2023-07-19 NOTE — Progress Notes (Signed)
  Inpatient Rehabilitation Admissions Coordinator   I have insurance approval for CIR, as of today, from [place] [facility] [employer]. I do not have a bed today. I spoke with patient by phone and he is aware. Hopeful for a bed in next 48 hrs. Acute team and TOC made aware.  Jeannetta Millman, RN, MSN Rehab Admissions Coordinator [phone] 07/19/2023 3:38 PM

## 2023-07-19 NOTE — H&P (Incomplete)
 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 Harvie Liner with past attempts at cardioversion, anxiety/depression.  Per chart review patient lives with spouse.  1 level home 4 steps to entry.  Patient reportedly independent at baseline prior to recent hospitalization.  Presented 07/02/20[facility] to [facility] with increasing back pain and shortness of breath.  Patient had reported progressive back pain over the past few months radiating to the lower extremities maintained on Percocet 5-3 25 1-2 tabs every 6 hours as needed as well as Lidoderm  patch.  Noted oxygen saturations 88% which improved with 6 L.  Chemistry showed sodium 124 potassium 3.3, chloride 91, total bilirubin 1.6, WBC 1.3, blood cultures no growth to date.  CT angiogram of the chest showed no evidence for pulmonary emboli.  Stable multifocal areas of groundglass opacity throughout both lungs along with some peripheral reticular opacities.  MRI thoracic spine showed no evidence for metastatic disease within the thoracic spine.  Evolving subacute compression fracture involving the superior endplate of T11 with no more than mild residual marrow edema.  Height loss not significantly changed or progressed as compared to prior MRI 06/06/2023.  Chronic compression deformities involving T12 and L1 vertebral bodies with sequela of prior  vertebral augmentation stable.  MRI lumbar spine showed acute compression fractures involving the inferior endplate of L2 with up to 60% height loss without significant bony retropulsion.  Additional chronic compression deformities of all of the T12, L1, L3, L4 and L5 vertebral bodies otherwise stable.  Left foraminal to extraforaminal disc protrusion at L3-4, potentially affecting the exiting left L3 nerve root.  There was a 3.7 cm intra-abdominal aortic aneurysm recommendations of follow-up every 2 years.  Underwent image guided L2 vertebral augmentation kyphoplasty technique 07/08/2023 per Dr. Blinda Burger of interventional radiology.  Hospital course follow-up oncology services Dr. Maria Shiner in regards to neutropenia he did receive a trial of Neupogen  with latest WBC 7.4.  Patient remains on chronic Eliquis  for history of atrial fibrillation.  Hyponatremia improved 130-134 133 maintained on sodium chloride  tablets.  Palliative care was consulted to establish goals of care.  Pain management with the use of Durogesic patch/lighted Derm patch with Robaxin  as needed and Dilaudid  3 mg every 4 hours breakthrough pain as needed.  Therapy evaluations completed due to patient's decreased functional mobility was admitted for a comprehensive rehab program.  Review of Systems  Constitutional:  Negative for chills and fever.  HENT:  Negative for hearing loss.   Eyes:  Negative for blurred vision and double vision.  Respiratory:  Positive for cough. Negative for wheezing.        Shortness of breath with exertion  Cardiovascular:  Positive for palpitations and leg swelling. Negative for chest pain.  Gastrointestinal:  Positive for constipation. Negative for heartburn, nausea and vomiting.       GERD  Genitourinary:  Positive for urgency. Negative for dysuria, flank pain and hematuria.  Musculoskeletal:  Positive for back pain, myalgias and neck pain.  Skin:  Negative for rash.  Neurological:  Positive for tingling,  sensory change and focal weakness. Negative for weakness.  Psychiatric/Behavioral:  Positive for depression. The patient has insomnia.        Anxiety  All other systems reviewed and are negative.  Past Medical History:  Diagnosis Date   Arthritis    Rheumatoid   Bacteremia due to Pseudomonas    C. difficile colitis    Calculus of GB and bile duct w ac and chr cholecyst w obst 06/08/2023   Chronic anxiety 11/08/2014   Cough 09/22/2021   COVID 2020   mild case   Depression    Drug-induced neutropenia (HCC) 08/11/2020   Family history of adverse reaction to anesthesia    brother with seizures had episode under anesthesia.  Patient has seizures as well.   GERD (gastroesophageal reflux disease)    History of radiation therapy 04/24/2020-05/16/2020   IMRT to right lung     Dr Retta Caster   History of radiation therapy    08/10/21-08/19/21-Dr. Retta Caster   History of radiation therapy    Abdomen- 03/21/23-03/31/23-Dr. Retta Caster   Neutropenia [facility]) 05/12/2020   Neutropenic fever (HCC) 05/12/2020   Normocytic anemia 04/18/2021   PAF (paroxysmal atrial fibrillation) (HCC)    CHADS2VSAC score 3   Rheumatoid aortitis    Secondary hypercoagulable state (HCC) 10/28/2020   Sepsis (HCC) 10/06/2020   Shortness of breath 12/18/2020   Squamous cell lung cancer (HCC)    Past Surgical History:  Procedure Laterality Date   BRONCHIAL BRUSHINGS  03/27/2020   Procedure: BRONCHIAL BRUSHINGS;  Surgeon: Denson Flake, MD;  Location: [facility] ENDOSCOPY;  Service: Cardiopulmonary;;   BUBBLE STUDY  10/13/2020   Procedure: BUBBLE STUDY;  Surgeon: Hazle Lites, MD;  Location: [facility] ENDOSCOPY;  Service: Cardiovascular;;   CARDIOVERSION N/A 10/13/2020   Procedure: CARDIOVERSION;  Surgeon: Hazle Lites, MD;  Location: [facility] ENDOSCOPY;  Service: Cardiovascular;  Laterality: N/A;   CATARACT EXTRACTION  2016   at [facility]   CHOLECYSTECTOMY N/A 06/08/2023   Procedure: LAPAROSCOPIC CHOLECYSTECTOMY;  Surgeon:  Junie Olds, MD;  Location: [facility] ORS;  Service: General;  Laterality: N/A;   FINE NEEDLE ASPIRATION  03/27/2020   Procedure: FINE NEEDLE ASPIRATION;  Surgeon: Denson Flake, MD;  Location: [facility] ENDOSCOPY;  Service: Cardiopulmonary;;   HIP SURGERY Left    IR KYPHO EA ADDL LEVEL THORACIC OR LUMBAR  10/12/2021   IR KYPHO LUMBAR INC FX REDUCE BONE BX UNI/BIL CANNULATION INC/IMAGING  10/12/2021   IR KYPHO LUMBAR INC FX REDUCE BONE BX UNI/BIL CANNULATION INC/IMAGING  07/08/2023   IR KYPHO THORACIC WITH BONE BIOPSY  09/07/2021   IR RADIOLOGIST EVAL & MGMT  09/03/2021   RADIOLOGY WITH ANESTHESIA N/A 10/12/2021   Procedure: L1 and L3 Kyphoplasty;  Surgeon: de Macedo Rodrigues, [place], MD;  Location: [facility] OR;  Service: Radiology;  Laterality: N/A;   TEE WITHOUT CARDIOVERSION N/A 10/13/2020   Procedure: TRANSESOPHAGEAL ECHOCARDIOGRAM (TEE);  Surgeon: Hazle Lites, MD;  Location: [facility] ENDOSCOPY;  Service: Cardiovascular;  Laterality: N/A;   VIDEO BRONCHOSCOPY WITH ENDOBRONCHIAL ULTRASOUND N/A 03/27/2020   Procedure: VIDEO BRONCHOSCOPY WITH ENDOBRONCHIAL ULTRASOUND;  Surgeon: Denson Flake, MD;  Location: [facility] ENDOSCOPY;  Service: Cardiopulmonary;  Laterality: N/A;   Family History  Problem Relation Age of Onset   Arrhythmia Mother        has PPM   Heart disease Father  Died at 80, started in his 13s, heart attacks, had PPM and ICD   Social History:  reports that he quit smoking about 7 years ago. His smoking use included cigarettes. He started smoking about 37 years ago. He has a 30 pack-year smoking history. He has been exposed to tobacco smoke. He has never used smokeless tobacco. He reports that he does not currently use alcohol. He reports that he does not use drugs. Allergies:  Allergies  Allergen Reactions   Ventolin  [Albuterol ] Other (See Comments)    Patient has had episode of atrial fib following administration of albuterol  (tolerates Xopenex  well)   Lipitor [Atorvastatin ] Other (See  Comments)    Arthralgias    Oxycodone  Other (See Comments)    Severe Migranes   Ketamine  Anxiety    "Feeling of doom"   Medications Prior to Admission  Medication Sig Dispense Refill   acetaminophen  (TYLENOL ) 500 MG tablet Take 500 mg by mouth daily as needed for moderate pain (pain score 4-6), fever or headache.     amiodarone  (PACERONE ) 200 MG tablet START by taking 2 tablets (400mg ) daily for 2 weeks THEN reduce to 1 tablet (200mg ) daily (Patient taking differently: Take 200 mg by mouth daily.) 90 tablet 3   benzonatate  (TESSALON ) 200 MG capsule Take 1 capsule (200 mg total) by mouth 3 (three) times daily as needed. 45 capsule 1   ELIQUIS  5 MG TABS tablet TAKE 1 TABLET(5 MG) BY MOUTH TWICE DAILY (Patient taking differently: Take 5 mg by mouth 2 (two) times daily.) 60 tablet 5   Evolocumab  (REPATHA  SURECLICK) 140 MG/ML SOAJ ADMINISTER 1 ML UNDER THE SKIN EVERY 14 DAYS 6 mL 3   HYDROcodone  bit-homatropine (HYDROMET) 5-1.5 MG/5ML syrup Take 5 mLs by mouth every 6 (six) hours as needed for cough. 120 mL 0   lidocaine  (LIDODERM ) 5 % Place 1 patch onto the skin daily. Remove & Discard patch within 12 hours or as directed by MD 30 patch 4   metoprolol  succinate (TOPROL -XL) 25 MG 24 hr tablet TAKE 1 TABLET(25 MG) BY MOUTH DAILY 90 tablet 3   ondansetron  (ZOFRAN ) 8 MG tablet Take 1 tablet (8 mg total) by mouth every 8 (eight) hours as needed for nausea or vomiting. 20 tablet 3   oxyCODONE -acetaminophen  (PERCOCET/ROXICET) 5-325 MG tablet Take 1-2 tablets by mouth every 6 (six) hours as needed. 90 tablet 0   PARoxetine  (PAXIL ) 10 MG tablet Take 1 tablet (10 mg total) by mouth at bedtime. 90 tablet 2   polyethylene glycol (MIRALAX  / GLYCOLAX ) 17 g packet Take 17 g by mouth daily as needed for mild constipation.     predniSONE  (DELTASONE ) 5 MG tablet Take 1 tablet (5 mg total) by mouth daily with breakfast. 90 tablet 4   prochlorperazine  (COMPAZINE ) 10 MG tablet Take 1 tablet (10 mg total) by mouth every  6 (six) hours as needed for nausea or vomiting. 30 tablet 3   TRELEGY ELLIPTA  100-62.5-25 MCG/ACT AEPB INHALE 1 PUFF INTO THE LUNGS DAILY 60 each 5      Home: Home Living Family/patient expects to be discharged to:: Private residence Living Arrangements: Spouse/significant other Available Help at Discharge: Family, Friend(s), Available 24 hours/day Type of Home: House Home Access: Ramped entrance Entrance Stairs-Number of Steps: 4 Entrance Stairs-Rails: Left Home Layout: One level Bathroom Shower/Tub: Health visitor: Handicapped height Bathroom Accessibility: Yes Home Equipment: Agricultural consultant (2 wheels), BSC/3in1, Shower seat, Hand held shower head, Wheelchair - manual Additional Comments: works from home and  wife able to work from home  Lives With: Spouse   Functional History: Prior Function Prior Level of Function : Independent/Modified Independent Mobility Comments: uses rw; O2 dependent at 3L. recently needing more assist. (since recent hospital admission - choleycystectomy ADLs Comments: uses O2 at home prn 3 lts, independent pta  Functional Status:  Mobility: Bed Mobility Overal bed mobility: Needs Assistance Bed Mobility: Rolling, Sidelying to Sit Rolling: Used rails, Supervision Sidelying to sit: Supervision, Used rails Sit to sidelying: Min assist, Used rails, Contact guard assist (for B LE's) General bed mobility comments: Performed without difficulty; good safety and maintenance of back precautions Transfers Overall transfer level: Needs assistance Equipment used: Rolling walker (2 wheels) Transfers: Sit to/from Stand, Bed to chair/wheelchair/BSC Sit to Stand: Min assist, +2 physical assistance Bed to/from chair/wheelchair/BSC transfer type:: Step pivot Squat pivot transfers: Contact guard assist Step pivot transfers: Min assist, +2 safety/equipment  Lateral/Scoot Transfers: Contact guard assist Transfer via Lift Equipment: Stedy General  transfer comment: STS x 6 during session,good recall on hand placement.  Small steps to chair with min A of 2 for safety Ambulation/Gait Ambulation/Gait assistance: Min assist, +2 safety/equipment Gait Distance (Feet): 30 Feet (30'x2) Assistive device: Rolling walker (2 wheels) Gait Pattern/deviations: Step-to pattern, Decreased stride length, Decreased weight shift to left General Gait Details: Pt taking small steps but no buckling today.  Had chair follow for safety.  Needed cues for rest breaks.  Needing min A for stability Gait velocity: decreased Wheelchair Mobility Wheelchair mobility: Yes Wheelchair propulsion: Both upper extremities, Right lower extremity Wheelchair parts: Needs assistance Distance: 200 Wheelchair Assistance Details (indicate cue type and reason): Unable to actively assist with L LE, cues to initiate brakes prior to transferring from w/c and benefits of frequent rest breaks  ADL: ADL Overall ADL's : Needs assistance/impaired Eating/Feeding: Bed level, Modified independent Grooming: Sitting, Wash/dry face, Oral care Grooming Details (indicate cue type and reason): sitting EOB with increased time. after completion of task patient reported that he had dizziness. BP was checked with 123/90 mmhg. Upper Body Bathing: Moderate assistance, Bed level Lower Body Bathing: Moderate assistance, Bed level Upper Body Dressing : Moderate assistance, Bed level Lower Body Dressing: Moderate assistance, Bed level Lower Body Dressing Details (indicate cue type and reason): patient reported he was unable to figure four either leg. AE bag retrieved only for patient to figure four leg while attempting to get sock aid over R foot. patient was educated that this was used to prevent need to reach feet. patient verbalized understanding reporting that he is unable to reach his LLE like that but then noted to pull it up onto lap with shakiness noted of LLE. patient again educated on how AE was  useful to prevent need to use BUE to maintain figure four position and possible discomfort at back. patient verbalized understanding, Toilet Transfer: Moderate assistance Toilet Transfer Details (indicate cue type and reason): scoot transfer to R side from reclienr to w/c and w/c to bed with increased time. General ADL Comments: Pt able to transfer from EOB to recliner utilizing Steady for additional support d/t LLE weakness and L knee buckling. Able to simulate walking for a minute or two standing in the [place] prior to transfer to recliner.  Cognition: Cognition Orientation Level: Oriented X4 Cognition Arousal: Alert Behavior During Therapy: WFL for tasks assessed/performed  Physical Exam: Blood pressure 120/80, pulse 91, temperature 97.6 F (36.4 C), temperature source Oral, resp. rate 18, height 5\' 11"  (1.803 m), weight 106.1 kg, SpO2 93%.  Physical Exam Neurological:     Comments: Patient is alert and oriented x 3.  Follows full commands.     No results found for this or any previous visit (from the past 48 hours). No results found.    Blood pressure 120/80, pulse 91, temperature 97.6 F (36.4 C), temperature source Oral, resp. rate 18, height 5\' 11"  (1.803 m), weight 106.1 kg, SpO2 93%.  Medical Problem List and Plan: 1. Functional deficits secondary to left lumbar radiculopathy status post L2 compression fracture with kyphoplasty 07/08/2023  -patient may *** shower  -ELOS/Goals: *** 2.  Antithrombotics: -DVT/anticoagulation:  Pharmaceutical: Eliquis   -antiplatelet therapy:  3. Pain Management: Duragesic  patch, Lidoderm  patch, Robaxin  1000 mg every 6 hours as needed, Dilaudid  3 mg every 4 hours as needed 4. Mood/Behavior/Sleep: Paxil  10 mg nightly  -antipsychotic agents: N/A 5. Neuropsych/cognition: This patient is capable of making decisions on his own behalf. 6. Skin/Wound Care: Routine skin checks 7. Fluids/Electrolytes/Nutrition: Routine in and outs with follow-up  chemistries 8.  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  Followed outpatient by Dr. Maria Shiner 9.  Chronic hypoxic respiratory failure/COPD/remote tobacco use.  Patient on 3 L oxygen nasal cannula prior to admission.  Maintained on prednisone  therapy 10.  Drug-induced neutropenia.  Follow-up hematology services 11.  PAF.  Amiodarone  200 mg daily, Toprol  25 mg daily.  Cardiac rate controlled.  Follow  up cardiology services 12.  GERD.  Protonix  13.  Pneumonitis 2/2.  Continues on prednisone  therapy   John Eisenmenger, PA-C 07/19/2023

## 2023-07-20 ENCOUNTER — Inpatient Hospital Stay (HOSPITAL_COMMUNITY)
Admission: AD | Admit: 2023-07-20 | Discharge: 2023-07-30 | DRG: 552 | Disposition: A | Discharge status: Home Health | Source: Intra-hospital | Attending: Physical Medicine & Rehabilitation | Admitting: Physical Medicine & Rehabilitation

## 2023-07-20 ENCOUNTER — Other Ambulatory Visit: Payer: Self-pay

## 2023-07-20 ENCOUNTER — Encounter (HOSPITAL_COMMUNITY): Payer: Self-pay | Admitting: Physical Medicine and Rehabilitation

## 2023-07-20 DIAGNOSIS — M5416 Radiculopathy, lumbar region: Secondary | ICD-10-CM | POA: Diagnosis not present

## 2023-07-20 DIAGNOSIS — F419 Anxiety disorder, unspecified: Secondary | ICD-10-CM | POA: Diagnosis present

## 2023-07-20 DIAGNOSIS — G8929 Other chronic pain: Secondary | ICD-10-CM | POA: Diagnosis present

## 2023-07-20 DIAGNOSIS — T380X5A Adverse effect of glucocorticoids and synthetic analogues, initial encounter: Secondary | ICD-10-CM | POA: Diagnosis present

## 2023-07-20 DIAGNOSIS — S22080S Wedge compression fracture of T11-T12 vertebra, sequela: Secondary | ICD-10-CM

## 2023-07-20 DIAGNOSIS — Z79891 Long term (current) use of opiate analgesic: Secondary | ICD-10-CM | POA: Diagnosis not present

## 2023-07-20 DIAGNOSIS — C3411 Malignant neoplasm of upper lobe, right bronchus or lung: Secondary | ICD-10-CM | POA: Diagnosis present

## 2023-07-20 DIAGNOSIS — D696 Thrombocytopenia, unspecified: Secondary | ICD-10-CM | POA: Diagnosis present

## 2023-07-20 DIAGNOSIS — Z9981 Dependence on supplemental oxygen: Secondary | ICD-10-CM | POA: Diagnosis not present

## 2023-07-20 DIAGNOSIS — Z7952 Long term (current) use of systemic steroids: Secondary | ICD-10-CM

## 2023-07-20 DIAGNOSIS — J9611 Chronic respiratory failure with hypoxia: Secondary | ICD-10-CM | POA: Diagnosis present

## 2023-07-20 DIAGNOSIS — M8088XD Other osteoporosis with current pathological fracture, vertebra(e), subsequent encounter for fracture with routine healing: Secondary | ICD-10-CM | POA: Diagnosis not present

## 2023-07-20 DIAGNOSIS — M545 Low back pain, unspecified: Secondary | ICD-10-CM

## 2023-07-20 DIAGNOSIS — Z923 Personal history of irradiation: Secondary | ICD-10-CM

## 2023-07-20 DIAGNOSIS — R4589 Other symptoms and signs involving emotional state: Secondary | ICD-10-CM | POA: Diagnosis present

## 2023-07-20 DIAGNOSIS — Z7951 Long term (current) use of inhaled steroids: Secondary | ICD-10-CM

## 2023-07-20 DIAGNOSIS — C799 Secondary malignant neoplasm of unspecified site: Secondary | ICD-10-CM | POA: Diagnosis present

## 2023-07-20 DIAGNOSIS — Z7969 Long term (current) use of other immunomodulators and immunosuppressants: Secondary | ICD-10-CM

## 2023-07-20 DIAGNOSIS — Z7901 Long term (current) use of anticoagulants: Secondary | ICD-10-CM

## 2023-07-20 DIAGNOSIS — H2512 Age-related nuclear cataract, left eye: Secondary | ICD-10-CM

## 2023-07-20 DIAGNOSIS — F32A Depression, unspecified: Secondary | ICD-10-CM | POA: Diagnosis present

## 2023-07-20 DIAGNOSIS — D702 Other drug-induced agranulocytosis: Secondary | ICD-10-CM | POA: Diagnosis present

## 2023-07-20 DIAGNOSIS — M5116 Intervertebral disc disorders with radiculopathy, lumbar region: Secondary | ICD-10-CM | POA: Diagnosis present

## 2023-07-20 DIAGNOSIS — E876 Hypokalemia: Secondary | ICD-10-CM | POA: Diagnosis not present

## 2023-07-20 DIAGNOSIS — Z885 Allergy status to narcotic agent status: Secondary | ICD-10-CM

## 2023-07-20 DIAGNOSIS — I480 Paroxysmal atrial fibrillation: Secondary | ICD-10-CM | POA: Diagnosis present

## 2023-07-20 DIAGNOSIS — Z79899 Other long term (current) drug therapy: Secondary | ICD-10-CM

## 2023-07-20 DIAGNOSIS — E871 Hypo-osmolality and hyponatremia: Secondary | ICD-10-CM | POA: Diagnosis present

## 2023-07-20 DIAGNOSIS — J449 Chronic obstructive pulmonary disease, unspecified: Secondary | ICD-10-CM | POA: Diagnosis present

## 2023-07-20 DIAGNOSIS — J9621 Acute and chronic respiratory failure with hypoxia: Secondary | ICD-10-CM | POA: Diagnosis not present

## 2023-07-20 DIAGNOSIS — Z8249 Family history of ischemic heart disease and other diseases of the circulatory system: Secondary | ICD-10-CM

## 2023-07-20 DIAGNOSIS — J984 Other disorders of lung: Secondary | ICD-10-CM | POA: Diagnosis present

## 2023-07-20 DIAGNOSIS — M40205 Unspecified kyphosis, thoracolumbar region: Secondary | ICD-10-CM | POA: Diagnosis present

## 2023-07-20 DIAGNOSIS — Z9889 Other specified postprocedural states: Secondary | ICD-10-CM

## 2023-07-20 DIAGNOSIS — I714 Abdominal aortic aneurysm, without rupture, unspecified: Secondary | ICD-10-CM | POA: Diagnosis present

## 2023-07-20 DIAGNOSIS — M069 Rheumatoid arthritis, unspecified: Secondary | ICD-10-CM | POA: Diagnosis present

## 2023-07-20 DIAGNOSIS — Z8616 Personal history of COVID-19: Secondary | ICD-10-CM

## 2023-07-20 DIAGNOSIS — K219 Gastro-esophageal reflux disease without esophagitis: Secondary | ICD-10-CM | POA: Diagnosis present

## 2023-07-20 DIAGNOSIS — Z888 Allergy status to other drugs, medicaments and biological substances status: Secondary | ICD-10-CM

## 2023-07-20 DIAGNOSIS — Z87891 Personal history of nicotine dependence: Secondary | ICD-10-CM

## 2023-07-20 LAB — CBC WITH DIFFERENTIAL/PLATELET
Abs Immature Granulocytes: 0.06 10*3/uL (ref 0.00–0.07)
Basophils Absolute: 0 10*3/uL (ref 0.0–0.1)
Basophils Relative: 0 %
Eosinophils Absolute: 0.1 10*3/uL (ref 0.0–0.5)
Eosinophils Relative: 1 %
HCT: 42.1 % (ref 39.0–52.0)
Hemoglobin: 13.4 g/dL (ref 13.0–17.0)
Immature Granulocytes: 1 %
Lymphocytes Relative: 12 %
Lymphs Abs: 1 10*3/uL (ref 0.7–4.0)
MCH: 31.2 pg (ref 26.0–34.0)
MCHC: 31.8 g/dL (ref 30.0–36.0)
MCV: 98.1 fL (ref 80.0–100.0)
Monocytes Absolute: 0.7 10*3/uL (ref 0.1–1.0)
Monocytes Relative: 9 %
Neutro Abs: 6.3 10*3/uL (ref 1.7–7.7)
Neutrophils Relative %: 77 %
Platelets: 241 10*3/uL (ref 150–400)
RBC: 4.29 MIL/uL (ref 4.22–5.81)
RDW: 18.7 % — ABNORMAL HIGH (ref 11.5–15.5)
WBC: 8.2 10*3/uL (ref 4.0–10.5)
nRBC: 0.2 % (ref 0.0–0.2)

## 2023-07-20 LAB — COMPREHENSIVE METABOLIC PANEL WITH GFR
ALT: 35 U/L (ref 0–44)
AST: 26 U/L (ref 15–41)
Albumin: 3.6 g/dL (ref 3.5–5.0)
Alkaline Phosphatase: 75 U/L (ref 38–126)
Anion gap: 9 (ref 5–15)
BUN: 15 mg/dL (ref 8–23)
CO2: 26 mmol/L (ref 22–32)
Calcium: 8.6 mg/dL — ABNORMAL LOW (ref 8.9–10.3)
Chloride: 102 mmol/L (ref 98–111)
Creatinine, Ser: 0.94 mg/dL (ref 0.61–1.24)
GFR, Estimated: >60 mL/min (ref >60)
Glucose, Bld: 101 mg/dL — ABNORMAL HIGH (ref 70–99)
Potassium: 3.3 mmol/L — ABNORMAL LOW (ref 3.5–5.1)
Sodium: 137 mmol/L (ref 135–145)
Total Bilirubin: 1 mg/dL (ref 0.0–1.2)
Total Protein: 6.3 g/dL — ABNORMAL LOW (ref 6.5–8.1)

## 2023-07-20 MED ORDER — BISACODYL 10 MG RE SUPP: 10.0000 mg | Freq: Every day | RECTAL | Status: DC | PRN

## 2023-07-20 MED ORDER — LIDOCAINE 5 % EX PTCH
1.0000 | MEDICATED_PATCH | CUTANEOUS | Status: DC
  Administered 2023-07-20 – 2023-07-29 (×10): 1 via TRANSDERMAL
  Filled 2023-07-20 (×10): qty 1

## 2023-07-20 MED ORDER — AMIODARONE HCL 200 MG PO TABS: 200.0000 mg | ORAL_TABLET | Freq: Every day | ORAL | Status: DC

## 2023-07-20 MED ORDER — BUDESON-GLYCOPYRROL-FORMOTEROL 160-9-4.8 MCG/ACT IN AERO
2.0000 | INHALATION_SPRAY | Freq: Two times a day (BID) | RESPIRATORY_TRACT | Status: DC
  Administered 2023-07-20 – 2023-07-30 (×20): 2 via RESPIRATORY_TRACT

## 2023-07-20 MED ORDER — SENNOSIDES-DOCUSATE SODIUM 8.6-50 MG PO TABS: 2.0000 | ORAL_TABLET | Freq: Two times a day (BID) | ORAL | Status: DC

## 2023-07-20 MED ORDER — METOPROLOL SUCCINATE ER 25 MG PO TB24
25.0000 mg | ORAL_TABLET | Freq: Every day | ORAL | Status: DC
  Administered 2023-07-21 – 2023-07-22 (×2): 25 mg via ORAL
  Filled 2023-07-20 (×3): qty 1

## 2023-07-20 MED ORDER — ACETAMINOPHEN 325 MG PO TABS: 650.0000 mg | ORAL_TABLET | Freq: Four times a day (QID) | ORAL | Status: DC | PRN

## 2023-07-20 MED ORDER — PREDNISONE 5 MG PO TABS
10.0000 mg | ORAL_TABLET | Freq: Every day | ORAL | Status: AC
  Administered 2023-07-22 – 2023-07-23 (×2): 10 mg via ORAL
  Filled 2023-07-20 (×2): qty 2

## 2023-07-20 MED ORDER — PAROXETINE HCL 10 MG PO TABS
10.0000 mg | ORAL_TABLET | Freq: Every day | ORAL | Status: DC
  Administered 2023-07-20 – 2023-07-29 (×10): 10 mg via ORAL
  Filled 2023-07-20 (×10): qty 1

## 2023-07-20 MED ORDER — PREDNISONE 5 MG PO TABS
5.0000 mg | ORAL_TABLET | Freq: Every day | ORAL | Status: DC
  Administered 2023-07-24 – 2023-07-30 (×7): 5 mg via ORAL
  Filled 2023-07-20 (×7): qty 1

## 2023-07-20 MED ORDER — ENSURE ENLIVE PO LIQD
237.0000 mL | Freq: Three times a day (TID) | ORAL | Status: DC
Start: 2023-07-20 — End: 2023-07-30
  Administered 2023-07-20 – 2023-07-28 (×6): 237 mL via ORAL
  Filled 2023-07-20 (×7): qty 237

## 2023-07-20 MED ORDER — SENNOSIDES-DOCUSATE SODIUM 8.6-50 MG PO TABS
2.0000 | ORAL_TABLET | Freq: Two times a day (BID) | ORAL | Status: DC
  Administered 2023-07-21 – 2023-07-22 (×2): 2 via ORAL
  Filled 2023-07-20 (×10): qty 2

## 2023-07-20 MED ORDER — PREDNISONE 20 MG PO TABS
20.0000 mg | ORAL_TABLET | Freq: Every day | ORAL | Status: AC
  Administered 2023-07-21: 20 mg via ORAL
  Filled 2023-07-20: qty 1

## 2023-07-20 MED ORDER — PANTOPRAZOLE SODIUM 40 MG PO TBEC: 40.0000 mg | DELAYED_RELEASE_TABLET | Freq: Every day | ORAL | Status: DC

## 2023-07-20 MED ORDER — HYDROMORPHONE HCL 2 MG PO TABS
3.0000 mg | ORAL_TABLET | ORAL | Status: DC | PRN
  Administered 2023-07-21 – 2023-07-28 (×12): 3 mg via ORAL
  Filled 2023-07-20 (×13): qty 2

## 2023-07-20 MED ORDER — PANTOPRAZOLE SODIUM 40 MG PO TBEC
40.0000 mg | DELAYED_RELEASE_TABLET | Freq: Every day | ORAL | Status: DC
  Administered 2023-07-21 – 2023-07-30 (×10): 40 mg via ORAL
  Filled 2023-07-20 (×10): qty 1

## 2023-07-20 MED ORDER — METHOCARBAMOL 500 MG PO TABS: 1000.0000 mg | ORAL_TABLET | Freq: Four times a day (QID) | ORAL | Status: DC | PRN

## 2023-07-20 MED ORDER — FENTANYL 25 MCG/HR TD PT72: 1.0000 | MEDICATED_PATCH | TRANSDERMAL | Status: DC

## 2023-07-20 MED ORDER — ONDANSETRON HCL 4 MG PO TABS: 4.0000 mg | ORAL_TABLET | Freq: Four times a day (QID) | ORAL | Status: DC | PRN

## 2023-07-20 MED ORDER — BENZONATATE 100 MG PO CAPS: 200.0000 mg | ORAL_CAPSULE | Freq: Three times a day (TID) | ORAL | Status: DC | PRN

## 2023-07-20 MED ORDER — SODIUM CHLORIDE 1 G PO TABS
1.0000 g | ORAL_TABLET | Freq: Two times a day (BID) | ORAL | Status: DC
  Administered 2023-07-20 – 2023-07-25 (×8): 1 g via ORAL
  Filled 2023-07-20 (×9): qty 1

## 2023-07-20 MED ORDER — VITAMIN D (ERGOCALCIFEROL) 1.25 MG (50000 UNIT) PO CAPS: 50000.0000 [IU] | ORAL_CAPSULE | ORAL | Status: DC

## 2023-07-20 MED ORDER — ONDANSETRON HCL 4 MG/2ML IJ SOLN: 4.0000 mg | Freq: Four times a day (QID) | INTRAMUSCULAR | Status: DC | PRN

## 2023-07-20 MED ORDER — PREDNISONE 5 MG PO TABS: ORAL_TABLET | ORAL | Status: DC

## 2023-07-20 MED ORDER — ACETAMINOPHEN 325 MG PO TABS: 650.0000 mg | ORAL_TABLET | Freq: Four times a day (QID) | ORAL | Status: AC | PRN

## 2023-07-20 MED ORDER — POLYETHYLENE GLYCOL 3350 17 G PO PACK
17.0000 g | PACK | Freq: Every day | ORAL | Status: DC
Start: 2023-07-20 — End: 2023-07-30
  Administered 2023-07-27 – 2023-07-30 (×3): 17 g via ORAL
  Filled 2023-07-20 (×10): qty 1

## 2023-07-20 MED ORDER — SODIUM CHLORIDE 1 G PO TABS
1.0000 g | ORAL_TABLET | Freq: Two times a day (BID) | ORAL | Status: DC
Start: 2023-07-20 — End: 2023-07-30

## 2023-07-20 MED ORDER — HYDROMORPHONE HCL 2 MG PO TABS: 3.0000 mg | ORAL_TABLET | ORAL | Status: DC | PRN

## 2023-07-20 MED ORDER — AMIODARONE HCL 200 MG PO TABS
200.0000 mg | ORAL_TABLET | Freq: Every day | ORAL | Status: DC
  Administered 2023-07-21 – 2023-07-30 (×10): 200 mg via ORAL
  Filled 2023-07-20 (×10): qty 1

## 2023-07-20 MED ORDER — FENTANYL 25 MCG/HR TD PT72
1.0000 | MEDICATED_PATCH | TRANSDERMAL | Status: DC
  Administered 2023-07-21 – 2023-07-27 (×3): 1 via TRANSDERMAL
  Filled 2023-07-20 (×3): qty 1

## 2023-07-20 MED ORDER — ENSURE ENLIVE PO LIQD: 237.0000 mL | Freq: Three times a day (TID) | ORAL | Status: DC

## 2023-07-20 MED ORDER — APIXABAN 5 MG PO TABS
5.0000 mg | ORAL_TABLET | Freq: Two times a day (BID) | ORAL | Status: DC
  Administered 2023-07-20 – 2023-07-30 (×20): 5 mg via ORAL
  Filled 2023-07-20 (×20): qty 1

## 2023-07-20 MED ORDER — VITAMIN D (ERGOCALCIFEROL) 1.25 MG (50000 UNIT) PO CAPS
50000.0000 [IU] | ORAL_CAPSULE | ORAL | Status: DC
  Administered 2023-07-27: 50000 [IU] via ORAL
  Filled 2023-07-20: qty 1

## 2023-07-20 MED ORDER — ALBUTEROL SULFATE (2.5 MG/3ML) 0.083% IN NEBU: 2.5000 mg | INHALATION_SOLUTION | RESPIRATORY_TRACT | Status: DC | PRN

## 2023-07-20 NOTE — Progress Notes (Signed)
 PROGRESS NOTE John [name]  EAV:[id number] DOB: 07-12-1960 DOA: 07/02/2023 PCP: Patient, No Pcp Per  Brief Narrative/Hospital Course:  63 y.o. 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 He was walking without assistive device before this hospitalization.A few days before the hospitalization, he noticed left leg weakness and pain which has progressed since.    He was admitted for intractable back pain, found to have an acute compression fracture of L2.  Now s/p kyphoplasty.   After the procedure, he had left lower extremity pain and weakness.  This has improved with steroids.    Now awaiting CIR, insurance authorization started.    Procedures: 07-08-2023 lumbar L2 kyphoplasty  Consultants: Palliative care Heme/onc IR   Subjective: Seen and examined+ Resting well, lt leg still weak cannot lift it or walk on it x 4 wks. Overnight afebrile BP stable-soft Last labs reviewed from 5/31: Repeat labs ordered for today, CBC stable BMP pending  Assessment and plan:  Intractable Back Pain Left LE Pain and weakness Compression Fracture of L2  Chronic compression Deformities (T12, L1, L3, L4, and L5 Vertebral Bodies): MRI T/L spine showed acute compression fracture and other findings- see report S/p L2 kyphoplasty on 5/23, and also treated with pain medication, steroids> with improving symptoms question if this is radicular pain. Neurosurgery was discussed> no recommendation for surgical intervention, Continue pain control, on scheduled fentanyl  patch, steroid taper, and as needed Tylenol /oral and IV  narcotics, muscle relaxants, lidocaine  patch. Continue PT OT PT/OT> CIR monitoring hoping to have CIR bed soon   Acute on Chronic Hypoxic Respiratory Failure: Uses 3 L at baseline> wean to home setting. Cont IS, ptot.  Of note initially needed up to 15 L HFNC. CT PE>without PE, multifocal areas of GGO throughout both lungs along with peripheral reticular opacities (infectious/inflammatory). Stable focal airspace opacity in R perihilar region and sperior segment of the RLL with central cavitation.   S/p Cefepime  5/17-5/21 and negative covid, flu, RSV.  Negative urine strep, negative urine legionella, negative MRSA PCR.   Stage IV Squamous Cell Carcinoma of R Lung Following with Dr. Maria Shiner on gemzar  and navelbine    Atrial Fibrillation: Rate controlled continue metoprolol , oral amiodarone , anticoagulation with Eliquis .     SIADH Stable on salt tabs and fluid restriction   Body mass index is 32.64 kg/m. Class I obesity weight loss advised  DVT prophylaxis: Eliquis  Code Status:   Code Status: Full Code Family Communication: plan of care discussed with patient at bedside. Patient status is: Remains hospitalized because of severity of illness Level of care: Telemetry   Dispo: The patient is from: home            Anticipated disposition: CIR pending. Objective: Vitals last 24 hrs: Vitals:   07/19/23 1320 07/19/23 2020 07/20/23 0432 07/20/23 0854  BP: 120/80 100/64 104/81 104/83  Pulse: 91 77 78 89  Resp:  20 16 20   Temp:  98.7 F (37.1 C) 98.2 F (36.8 C) 97.8 F (36.6 C)  TempSrc:  Oral    SpO2: 93% 97% 95% 91%  Weight:      Height:        Physical Examination: General  exam: alert awake, older than stated age HEENT:Oral mucosa moist, Ear/Nose WNL grossly Respiratory system: Bilaterally clear BS, no use of accessory muscle Cardiovascular system: S1 & S2 +. Gastrointestinal system: Abdomen soft, NT,ND,BS+ Nervous System: Alert, awake, significant weakness in the left leg  unable to lift up able to drain Extremities: LE edema neg, warm extremities Skin: No rashes,warm. MSK: Normal muscle bulk/tone.   Data Reviewed: I have personally reviewed following labs and imaging studies ( see epic result tab) CBC: Recent Labs  Lab 07/14/23 0353 07/15/23 0341 07/16/23 0426 07/20/23 0821  WBC 7.5 7.1 7.4 8.2  NEUTROABS 6.1 5.8 6.1 6.3  HGB 10.6* 11.0* 10.7* 13.4  HCT 33.0* 34.9* 33.3* 42.1  MCV 96.8 97.8 95.7 98.1  PLT 248 248 228 241   CMP: Recent Labs  Lab 07/14/23 0353 07/15/23 0341 07/16/23 0426  NA 132* 134* 133*  K 3.7 3.6 3.4*  CL 99 103 101  CO2 25 25 26   GLUCOSE 108* 89 98  BUN 16 16 19   CREATININE 0.63 0.76 0.80  CALCIUM  8.5* 8.3* 8.3*  MG 2.1 2.1 2.2  PHOS 3.3 3.3 3.3   GFR: Estimated Creatinine Clearance: 120.1 mL/min (by C-G formula based on SCr of 0.8 mg/dL). Recent Labs  Lab 07/13/23 2047 07/14/23 0353 07/15/23 0341 07/16/23 0426  AST  --  27 24 25   ALT  --  35 34 35  ALKPHOS  --  78 73 73  BILITOT  --  1.0 0.9 0.9  PROT  --  5.8* 5.9* 5.9*  ALBUMIN  3.2* 3.3* 3.2* 3.3*   No results for input(s): "LIPASE", "AMYLASE" in the last 168 hours. No results for input(s): "AMMONIA" in the last 168 hours. Coagulation Profile: No results for input(s): "INR", "PROTIME" in the last 168 hours. Unresulted Labs (From admission, onward)     Start     Ordered   07/20/23 0706  Comprehensive metabolic panel  Once,   R       Question:  Specimen collection method  Answer:  Lab=Lab collect   07/20/23 0706   Signed and Held  Comprehensive metabolic panel  Tomorrow morning,   R       Question:  Specimen collection method  Answer:  Lab=Lab collect   Signed and Held   Signed and Held  CBC with Differential/Platelet  Tomorrow morning,   R       Question:  Specimen collection method  Answer:  Lab=Lab collect   Signed and Held           Antimicrobials/Microbiology: Anti-infectives (From admission, onward)    Start     Dose/Rate Route  Frequency Ordered Stop   07/07/23 0600  ceFAZolin  (ANCEF ) IVPB 2g/100 mL premix       Note to Pharmacy: Patient is at [facility] and will transfer round-trip to [facility] for procedure.   2 g 200 mL/hr over 30 Minutes Intravenous To Radiology 07/05/23 1734 07/08/23 0753   07/03/23 0800  vancomycin  (VANCOCIN ) IVPB 1000 mg/200 mL premix  Status:  Discontinued        1,000 mg 200 mL/hr over 60 Minutes Intravenous Every 12 hours 07/02/23 2146 07/03/23 0722   07/03/23 0200  ceFEPIme  (MAXIPIME ) 2 g in sodium chloride  0.9 % 100 mL IVPB  Status:  Discontinued        2 g 200 mL/hr over 30 Minutes Intravenous Every 8 hours 07/02/23 2141 07/06/23 1454   07/02/23 1845  vancomycin  (VANCOREADY) IVPB 2000 mg/400 mL  2,000 mg 200 mL/hr over 120 Minutes Intravenous  Once 07/02/23 1813 07/02/23 2120   07/02/23 1815  vancomycin  (VANCOCIN ) IVPB 1000 mg/200 mL premix  Status:  Discontinued        1,000 mg 200 mL/hr over 60 Minutes Intravenous  Once 07/02/23 1807 07/02/23 1813   07/02/23 1815  ceFEPIme  (MAXIPIME ) 2 g in sodium chloride  0.9 % 100 mL IVPB        2 g 200 mL/hr over 30 Minutes Intravenous  Once 07/02/23 1807 07/02/23 1911         Component Value Date/Time   SDES  07/02/2023 2215    BLOOD RIGHT ARM Performed at [facility], [address] [place], [place] [address]    SDES  07/02/2023 2215    BLOOD RIGHT ARM Performed at [facility], [address], [place], [place] [address]    SPECREQUEST  07/02/2023 2215    BOTTLES DRAWN AEROBIC AND ANAEROBIC Blood Culture results may not be optimal due to an inadequate volume of blood received in culture bottles Performed at [facility], [address], [place], [place] [address]    SPECREQUEST  07/02/2023 2215    BOTTLES DRAWN AEROBIC AND ANAEROBIC Blood Culture results may not be optimal due to an inadequate volume of blood received in culture bottles Performed at [facility], [address]  [place], [place] [address]    CULT  07/02/2023 2215    NO GROWTH 5 DAYS Performed at [facility], [address], [place], [place] [address]    CULT  07/02/2023 2215    NO GROWTH 5 DAYS Performed at [facility], [address], [place], [place] [address]    REPTSTATUS 07/07/2023 FINAL 07/02/2023 2215   REPTSTATUS 07/07/2023 FINAL 07/02/2023 2215   Medications reviewed: Scheduled Meds:  amiodarone   200 mg Oral Daily   apixaban   5 mg Oral BID   budesonide -glycopyrrolate -formoterol   2 puff Inhalation BID   feeding supplement  237 mL Oral TID BM   fentaNYL   1 patch Transdermal Q72H   lidocaine   1 patch Transdermal Q24H   metoprolol  succinate  25 mg Oral Daily   pantoprazole   40 mg Oral Daily   PARoxetine   10 mg Oral QHS   polyethylene glycol  17 g Oral Daily   predniSONE   20 mg Oral Q breakfast   Followed by   Cecily Cohen ON 07/22/2023] predniSONE   10 mg Oral Q breakfast   Followed by   Cecily Cohen ON 07/24/2023] predniSONE   5 mg Oral Q breakfast   senna-docusate  2 tablet Oral BID   sodium chloride   1 g Oral BID WC   Vitamin D  (Ergocalciferol )  50,000 Units Oral Q7 days   Continuous Infusions:  Lesa Rape, MD [facility] 07/20/2023, 9:28 AM

## 2023-07-20 NOTE — H&P (Signed)
 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 Harvie Liner with past attempts at cardioversion, anxiety/depression.  Per chart review patient lives with spouse.  1 level home 4 steps to entry.  Patient reportedly independent at baseline prior to recent hospitalization.  Presented 07/02/2023 to [facility] with increasing back pain and shortness of breath.  Patient had reported progressive back pain over the past few months radiating to the lower extremities maintained on Percocet 5-3 25 1-2 tabs every 6 hours as needed as well as Lidoderm  patch.  Noted oxygen saturations 88% which improved with 6 L.  Chemistry showed sodium 124 potassium 3.3, chloride 91, total bilirubin 1.6, WBC 1.3, blood cultures no growth to date.  CT angiogram of the chest showed no evidence for pulmonary emboli.  Stable multifocal areas of groundglass opacity throughout both lungs along with some peripheral reticular opacities.  MRI thoracic spine showed no evidence for metastatic disease within the thoracic spine.  Evolving subacute compression fracture involving the superior endplate of T11 with no more than mild residual marrow edema.  Height loss not significantly changed or progressed as compared to prior MRI 06/06/2023.  Chronic compression deformities involving T12 and L1 vertebral bodies with sequela of  prior vertebral augmentation stable.  MRI lumbar spine showed acute compression fractures involving the inferior endplate of L2 with up to 60% height loss without significant bony retropulsion.  Additional chronic compression deformities of all of the T12, L1, L3, L4 and L5 vertebral bodies otherwise stable.  Left foraminal to extraforaminal disc protrusion at L3-4, potentially affecting the exiting left L3 nerve root.  There was a 3.7 cm intra-abdominal aortic aneurysm recommendations of follow-up every 2 years.  Underwent image guided L2 vertebral augmentation kyphoplasty technique 07/08/2023 per Dr. Blinda Burger of interventional radiology.  Hospital course follow-up oncology services Dr. Maria Shiner in regards to neutropenia he did receive a trial of Neupogen  with latest WBC 7.4.  Patient remains on chronic Eliquis  for history of atrial fibrillation.  Hyponatremia improved 130-134 133 maintained on sodium chloride  tablets.  Palliative care was consulted to establish goals of care.  Pain management with the use of Durogesic patch/lighted Derm patch with Robaxin  as needed and Dilaudid  3 mg every 4 hours breakthrough pain as needed.  Therapy evaluations completed due to patient's decreased functional mobility was admitted for a comprehensive rehab program. Complains of LLE weakness.  Review of Systems  Constitutional:  Negative for chills and fever.  HENT:  Negative for hearing loss.   Eyes:  Negative for blurred vision and double vision.  Respiratory:  Positive for cough. Negative for wheezing.        Shortness of breath with exertion  Cardiovascular:  Positive for palpitations and leg swelling. Negative for chest pain.  Gastrointestinal:  Positive for constipation. Negative for heartburn, nausea and vomiting.       GERD  Genitourinary:  Positive for urgency. Negative for dysuria, flank pain and hematuria.  Musculoskeletal:  Positive  for back pain, myalgias and neck pain.  Skin:  Negative for rash.   Neurological:  Positive for tingling, sensory change and focal weakness. Negative for weakness.  Psychiatric/Behavioral:  Positive for depression. The patient has insomnia.        Anxiety  All other systems reviewed and are negative.  Past Medical History:  Diagnosis Date   Arthritis    Rheumatoid   Bacteremia due to Pseudomonas    C. difficile colitis    Calculus of GB and bile duct w ac and chr cholecyst w obst 06/08/2023   Chronic anxiety 11/08/2014   Cough 09/22/2021   COVID 2020   mild case   Depression    Drug-induced neutropenia (HCC) 08/11/2020   Family history of adverse reaction to anesthesia    brother with seizures had episode under anesthesia.  Patient has seizures as well.   GERD (gastroesophageal reflux disease)    History of radiation therapy 04/24/2020-05/16/2020   IMRT to right lung     Dr Retta Caster   History of radiation therapy    08/10/21-08/19/21-Dr. Retta Caster   History of radiation therapy    Abdomen- 03/21/23-03/31/23-Dr. Retta Caster   Neutropenia [facility]) 05/12/2020   Neutropenic fever (HCC) 05/12/2020   Normocytic anemia 04/18/2021   PAF (paroxysmal atrial fibrillation) (HCC)    CHADS2VSAC score 3   Rheumatoid aortitis    Secondary hypercoagulable state (HCC) 10/28/2020   Sepsis (HCC) 10/06/2020   Shortness of breath 12/18/2020   Squamous cell lung cancer (HCC)    Past Surgical History:  Procedure Laterality Date   BRONCHIAL BRUSHINGS  03/27/2020   Procedure: BRONCHIAL BRUSHINGS;  Surgeon: Denson Flake, MD;  Location: [facility] ENDOSCOPY;  Service: Cardiopulmonary;;   BUBBLE STUDY  10/13/2020   Procedure: BUBBLE STUDY;  Surgeon: Hazle Lites, MD;  Location: [facility] ENDOSCOPY;  Service: Cardiovascular;;   CARDIOVERSION N/A 10/13/2020   Procedure: CARDIOVERSION;  Surgeon: Hazle Lites, MD;  Location: [facility] ENDOSCOPY;  Service: Cardiovascular;  Laterality: N/A;   CATARACT EXTRACTION  2016   at [facility]   CHOLECYSTECTOMY N/A 06/08/2023   Procedure:  LAPAROSCOPIC CHOLECYSTECTOMY;  Surgeon: Junie Olds, MD;  Location: [facility] ORS;  Service: General;  Laterality: N/A;   FINE NEEDLE ASPIRATION  03/27/2020   Procedure: FINE NEEDLE ASPIRATION;  Surgeon: Denson Flake, MD;  Location: [facility] ENDOSCOPY;  Service: Cardiopulmonary;;   HIP SURGERY Left    IR KYPHO EA ADDL LEVEL THORACIC OR LUMBAR  10/12/2021   IR KYPHO LUMBAR INC FX REDUCE BONE BX UNI/BIL CANNULATION INC/IMAGING  10/12/2021   IR KYPHO LUMBAR INC FX REDUCE BONE BX UNI/BIL CANNULATION INC/IMAGING  07/08/2023   IR KYPHO THORACIC WITH BONE BIOPSY  09/07/2021   IR RADIOLOGIST EVAL & MGMT  09/03/2021   RADIOLOGY WITH ANESTHESIA N/A 10/12/2021   Procedure: L1 and L3 Kyphoplasty;  Surgeon: de Macedo Rodrigues, Katyucia, MD;  Location: [facility] OR;  Service: Radiology;  Laterality: N/A;   TEE WITHOUT CARDIOVERSION N/A 10/13/2020   Procedure: TRANSESOPHAGEAL ECHOCARDIOGRAM (TEE);  Surgeon: Hazle Lites, MD;  Location: [facility] ENDOSCOPY;  Service: Cardiovascular;  Laterality: N/A;   VIDEO BRONCHOSCOPY WITH ENDOBRONCHIAL ULTRASOUND N/A 03/27/2020   Procedure: VIDEO BRONCHOSCOPY WITH ENDOBRONCHIAL ULTRASOUND;  Surgeon: Denson Flake, MD;  Location: [facility] ENDOSCOPY;  Service: Cardiopulmonary;  Laterality: N/A;   Family History  Problem Relation Age of Onset   Arrhythmia Mother        has PPM   Heart disease Father  Died at 39, started in his 26s, heart attacks, had PPM and ICD   Social History:  reports that he quit smoking about 7 years ago. His smoking use included cigarettes. He started smoking about 37 years ago. He has a 30 pack-year smoking history. He has been exposed to tobacco smoke. He has never used smokeless tobacco. He reports that he does not currently use alcohol. He reports that he does not use drugs. Allergies:  Allergies  Allergen Reactions   Ventolin  [Albuterol ] Other (See Comments)    Patient has had episode of atrial fib following administration of albuterol  (tolerates Xopenex   well)   Lipitor [Atorvastatin ] Other (See Comments)    Arthralgias    Oxycodone  Other (See Comments)    Severe Migranes   Ketamine  Anxiety    "Feeling of doom"   Medications Prior to Admission  Medication Sig Dispense Refill   acetaminophen  (TYLENOL ) 325 MG tablet Take 2 tablets (650 mg total) by mouth every 6 (six) hours as needed for mild pain (pain score 1-3), fever or headache.     [START ON 07/21/2023] amiodarone  (PACERONE ) 200 MG tablet Take 1 tablet (200 mg total) by mouth daily.     benzonatate  (TESSALON ) 200 MG capsule Take 1 capsule (200 mg total) by mouth 3 (three) times daily as needed. 45 capsule 1   bisacodyl  (DULCOLAX) 10 MG suppository Place 1 suppository (10 mg total) rectally daily as needed for moderate constipation.     ELIQUIS  5 MG TABS tablet TAKE 1 TABLET(5 MG) BY MOUTH TWICE DAILY (Patient taking differently: Take 5 mg by mouth 2 (two) times daily.) 60 tablet 5   Evolocumab  (REPATHA  SURECLICK) 140 MG/ML SOAJ ADMINISTER 1 ML UNDER THE SKIN EVERY 14 DAYS 6 mL 3   feeding supplement (ENSURE ENLIVE / ENSURE PLUS) LIQD Take 237 mLs by mouth 3 (three) times daily between meals.     [START ON 07/21/2023] fentaNYL  (DURAGESIC ) 25 MCG/HR Place 1 patch onto the skin every 3 (three) days.     HYDROcodone  bit-homatropine (HYDROMET) 5-1.5 MG/5ML syrup Take 5 mLs by mouth every 6 (six) hours as needed for cough. 120 mL 0   HYDROmorphone  (DILAUDID ) 2 MG tablet Take 1.5 tablets (3 mg total) by mouth every 4 (four) hours as needed for moderate pain (pain score 4-6) or severe pain (pain score 7-10).     lidocaine  (LIDODERM ) 5 % Place 1 patch onto the skin daily. Remove & Discard patch within 12 hours or as directed by MD 30 patch 4   metoprolol  succinate (TOPROL -XL) 25 MG 24 hr tablet TAKE 1 TABLET(25 MG) BY MOUTH DAILY 90 tablet 3   ondansetron  (ZOFRAN ) 8 MG tablet Take 1 tablet (8 mg total) by mouth every 8 (eight) hours as needed for nausea or vomiting. 20 tablet 3   [START ON 07/21/2023]  pantoprazole  (PROTONIX ) 40 MG tablet Take 1 tablet (40 mg total) by mouth daily.     PARoxetine  (PAXIL ) 10 MG tablet Take 1 tablet (10 mg total) by mouth at bedtime. 90 tablet 2   polyethylene glycol (MIRALAX  / GLYCOLAX ) 17 g packet Take 17 g by mouth daily as needed for mild constipation.     [Paused] predniSONE  (DELTASONE ) 5 MG tablet Take 1 tablet (5 mg total) by mouth daily with breakfast. 90 tablet 4   [START ON 07/21/2023] predniSONE  (DELTASONE ) 5 MG tablet Take 4 tablets (20 mg total) by mouth daily with breakfast for 2 days, THEN 2 tablets (10 mg total) daily with breakfast for  2 days.     prochlorperazine  (COMPAZINE ) 10 MG tablet Take 1 tablet (10 mg total) by mouth every 6 (six) hours as needed for nausea or vomiting. 30 tablet 3   senna-docusate (SENOKOT-S) 8.6-50 MG tablet Take 2 tablets by mouth 2 (two) times daily.     sodium chloride  1 g tablet Take 1 tablet (1 g total) by mouth 2 (two) times daily with a meal.     TRELEGY ELLIPTA  100-62.5-25 MCG/ACT AEPB INHALE 1 PUFF INTO THE LUNGS DAILY 60 each 5   Vitamin D , Ergocalciferol , (DRISDOL ) 1.25 MG (50000 UNIT) CAPS capsule Take 1 capsule (50,000 Units total) by mouth every 7 (seven) days.     Home: Home Living Family/patient expects to be discharged to:: Private residence Living Arrangements: Spouse/significant other Available Help at Discharge: Family, Friend(s), Available 24 hours/day Type of Home: House Home Access: Ramped entrance Entrance Stairs-Number of Steps: 4 Entrance Stairs-Rails: Left Home Layout: One level Bathroom Shower/Tub: Health visitor: Handicapped height Bathroom Accessibility: Yes Home Equipment: Agricultural consultant (2 wheels), BSC/3in1, Information systems manager, Hand held shower head, Wheelchair - manual Additional Comments: works from home and wife able to work from home  Lives With: Spouse   Functional History: Prior Function Prior Level of Function : Independent/Modified Independent Mobility Comments:  uses rw; O2 dependent at 3L. recently needing more assist. (since recent hospital admission - choleycystectomy ADLs Comments: uses O2 at home prn 3 lts, independent pta   Functional Status:  Mobility: Bed Mobility Overal bed mobility: Needs Assistance Bed Mobility: Rolling, Sidelying to Sit Rolling: Used rails, Supervision Sidelying to sit: Supervision, Used rails Sit to sidelying: Min assist, Used rails, Contact guard assist (for B LE's) General bed mobility comments: Performed without difficulty; good safety and maintenance of back precautions Transfers Overall transfer level: Needs assistance Equipment used: Rolling walker (2 wheels) Transfers: Sit to/from Stand, Bed to chair/wheelchair/BSC Sit to Stand: Min assist, +2 physical assistance Bed to/from chair/wheelchair/BSC transfer type:: Step pivot Squat pivot transfers: Contact guard assist Step pivot transfers: Min assist, +2 safety/equipment  Lateral/Scoot Transfers: Contact guard assist Transfer via Lift Equipment: Stedy General transfer comment: STS x 6 during session,good recall on hand placement.  Small steps to chair with min A of 2 for safety Ambulation/Gait Ambulation/Gait assistance: Min assist, +2 safety/equipment Gait Distance (Feet): 30 Feet (30'x2) Assistive device: Rolling walker (2 wheels) Gait Pattern/deviations: Step-to pattern, Decreased stride length, Decreased weight shift to left General Gait Details: Pt taking small steps but no buckling today.  Had chair follow for safety.  Needed cues for rest breaks.  Needing min A for stability Gait velocity: decreased Wheelchair Mobility Wheelchair mobility: Yes Wheelchair propulsion: Both upper extremities, Right lower extremity Wheelchair parts: Needs assistance Distance: 200 Wheelchair Assistance Details (indicate cue type and reason): Unable to actively assist with L LE, cues to initiate brakes prior to transferring from w/c and benefits of frequent rest breaks    ADL: ADL Overall ADL's : Needs assistance/impaired Eating/Feeding: Bed level, Modified independent Grooming: Sitting, Wash/dry face, Oral care Grooming Details (indicate cue type and reason): sitting EOB with increased time. after completion of task patient reported that he had dizziness. BP was checked with 123/90 mmhg. Upper Body Bathing: Moderate assistance, Bed level Lower Body Bathing: Moderate assistance, Bed level Upper Body Dressing : Moderate assistance, Bed level Lower Body Dressing: Moderate assistance, Bed level Lower Body Dressing Details (indicate cue type and reason): patient reported he was unable to figure four either leg. AE bag retrieved only  for patient to figure four leg while attempting to get sock aid over R foot. patient was educated that this was used to prevent need to reach feet. patient verbalized understanding reporting that he is unable to reach his LLE like that but then noted to pull it up onto lap with shakiness noted of LLE. patient again educated on how AE was useful to prevent need to use BUE to maintain figure four position and possible discomfort at back. patient verbalized understanding, Toilet Transfer: Moderate assistance Toilet Transfer Details (indicate cue type and reason): scoot transfer to R side from reclienr to w/c and w/c to bed with increased time. General ADL Comments: Pt able to transfer from EOB to recliner utilizing Steady for additional support d/t LLE weakness and L knee buckling. Able to simulate walking for a minute or two standing in the [place] prior to transfer to recliner.   Cognition: Cognition Orientation Level: Oriented X4 Cognition Arousal: Alert Behavior During Therapy: WFL for tasks assessed/performed     Physical Exam: Height 5\' 11"  (1.803 m), weight 103.5 kg. Physical Exam Gen: no distress, normal appearing, BMI 31.82 HEENT: oral mucosa pink and moist, NCAT Cardio: Reg rate Chest: normal effort, normal rate of  breathing Abd: soft, non-distended Ext: no edema Psych: pleasant, normal affect, highly motivated Skin: intact Neurological:     Comments: Patient is alert and oriented x 3.  Follows full commands. Strength is 5/5 except for LLE: 2/5 HF, 2/5 KE, 4/5 DF and PF   Results for orders placed or performed during the hospital encounter of 07/02/23 (from the past 48 hours)  CBC with Differential/Platelet     Status: Abnormal   Collection Time: 07/20/23  8:21 AM  Result Value Ref Range   WBC 8.2 4.0 - 10.5 K/uL   RBC 4.29 4.22 - 5.81 MIL/uL   Hemoglobin 13.4 13.0 - 17.0 g/dL   HCT 16.1 09.6 - 04.5 %   MCV 98.1 80.0 - 100.0 fL   MCH 31.2 26.0 - 34.0 pg   MCHC 31.8 30.0 - 36.0 g/dL   RDW 40.9 (H) 81.1 - 91.4 %   Platelets 241 150 - 400 K/uL   nRBC 0.2 0.0 - 0.2 %   Neutrophils Relative % 77 %   Neutro Abs 6.3 1.7 - 7.7 K/uL   Lymphocytes Relative 12 %   Lymphs Abs 1.0 0.7 - 4.0 K/uL   Monocytes Relative 9 %   Monocytes Absolute 0.7 0.1 - 1.0 K/uL   Eosinophils Relative 1 %   Eosinophils Absolute 0.1 0.0 - 0.5 K/uL   Basophils Relative 0 %   Basophils Absolute 0.0 0.0 - 0.1 K/uL   Immature Granulocytes 1 %   Abs Immature Granulocytes 0.06 0.00 - 0.07 K/uL    Comment: Performed at [facility], [address] [place], [place] [address]  Comprehensive metabolic panel     Status: Abnormal   Collection Time: 07/20/23  8:21 AM  Result Value Ref Range   Sodium 137 135 - 145 mmol/L   Potassium 3.3 (L) 3.5 - 5.1 mmol/L   Chloride 102 98 - 111 mmol/L   CO2 26 22 - 32 mmol/L   Glucose, Bld 101 (H) 70 - 99 mg/dL    Comment: Glucose reference range applies only to samples taken after fasting for at least 8 hours.   BUN 15 8 - 23 mg/dL   Creatinine, Ser 6.21 0.61 - 1.24 mg/dL   Calcium  8.6 (L) 8.9 - 10.3 mg/dL  Total Protein 6.3 (L) 6.5 - 8.1 g/dL   Albumin  3.6 3.5 - 5.0 g/dL   AST 26 15 - 41 U/L   ALT 35 0 - 44 U/L   Alkaline Phosphatase 75 38 - 126 U/L   Total  Bilirubin 1.0 0.0 - 1.2 mg/dL   GFR, Estimated >56 >21 mL/min    Comment: (NOTE) Calculated using the CKD-EPI Creatinine Equation (2021)    Anion gap 9 5 - 15    Comment: Performed at [facility], [address], [place], [place] [address]   No results found.    Height 5\' 11"  (1.803 m), weight 103.5 kg.  Medical Problem List and Plan: 1. Functional deficits secondary to left lumbar radiculopathy status post L2 compression fracture with kyphoplasty 07/08/2023  -patient may shower  -ELOS/Goals: 7-10 days modI  Admit to CIR  2.  Antithrombotics: -DVT/anticoagulation:  Pharmaceutical: Eliquis    3. Pain Management: continue Duragesic  patch, Lidoderm  patch, Robaxin  1000 mg every 6 hours as needed, Dilaudid  3 mg every 4 hours as needed  4. Mood/Behavior/Sleep: Paxil  10 mg nightly  -antipsychotic agents: N/A  5. Neuropsych/cognition: This patient is capable of making decisions on his own behalf.  6. Skin/Wound Care: Routine skin checks 7. Fluids/Electrolytes/Nutrition: Routine in and outs with follow-up chemistries  8.  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  Followed outpatient by Dr. Maria Shiner  9.  Chronic hypoxic respiratory failure/COPD/remote tobacco use.  Patient on 3 L oxygen nasal cannula prior to admission.  Maintained on prednisone  therapy  10.  Drug-induced neutropenia.  Follow-up hematology services  11.  PAF: continue Amiodarone  200 mg daily, Toprol  25 mg daily.  Cardiac rate controlled.  Follow  up cardiology services  12.  GERD.  Continue Protonix   13.  Pneumonitis: continue prednisone    Everlyn Hockey Angiulli, PA-C   I have personally performed a face to face diagnostic evaluation, including, but not limited to relevant history and physical exam findings, of this patient and developed relevant assessment and plan.  Additionally, I have reviewed and concur with the physician assistant's documentation above.  Kamryn Messineo, MD

## 2023-07-20 NOTE — Progress Notes (Signed)
 Occupational Therapy Treatment Patient Details Name: John [name] MRN: [id number] DOB: [birth year] Today's Date: 07/20/2023   History of present illness Pt is 63 yo male who presented to the ED on 07/02/23 for evaluation of worsening back pain, L LE weakness, and shortness of breath. Pt found to have L2 compression fx and is s/p KP L2 on  5/23. Pt also wtih acute on chronic resp failure. 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   OT comments  Patient was able to make progress towards engagement in ADLs this AM. Needed cues to avoid bending for LB bath/dressing tasks. Patient will benefit from intensive inpatient follow-up therapy, >3 hours/day. Patient's discharge plan remains appropriate at this time. OT will continue to follow acutely.        If plan is discharge home, recommend the following:  A little help with bathing/dressing/bathroom;A little help with walking and/or transfers;Help with stairs or ramp for entrance;A lot of help with bathing/dressing/bathroom;A lot of help with walking and/or transfers   Equipment Recommendations  None recommended by OT       Precautions / Restrictions Precautions Precautions: Fall;Back Precaution Booklet Issued: Yes (comment) Recall of Precautions/Restrictions: Impaired (needs reminders) Restrictions Weight Bearing Restrictions Per Provider Order: No Other Position/Activity Restrictions: Left foraminal to extraforaminal disc protrusion at L3-4, potentially affecting the exiting left L3 nerve root              ADL either performed or assessed with clinical judgement   ADL Overall ADL's : Needs assistance/impaired         Upper Body Bathing: Contact guard  assist;Sitting Upper Body Bathing Details (indicate cue type and reason): sitting EOB Lower Body Bathing: Minimal assistance;Sitting/lateral leans Lower Body Bathing Details (indicate cue type and reason): cues to use figure four positioning sitting EOB to complete task Upper Body Dressing : Sitting;Contact guard assist   Lower Body Dressing: Sitting/lateral leans;Contact guard assist Lower Body Dressing Details (indicate cue type and reason): cues for figure four positioning on EOB with weight shifting to get pants up with significant leaning to compelte task and weight shift.     Toileting- Clothing Manipulation and Hygiene: Sitting/lateral lean;Contact guard assist                Cognition Arousal: Alert Behavior During Therapy: WFL for tasks assessed/performed Cognition: No apparent impairments                          Pertinent Vitals/ Pain       Pain Assessment Pain Assessment: Faces Faces Pain Scale: Hurts a little bit Pain Location: back, L LE Pain Descriptors / Indicators: Grimacing         Frequency  Min 2X/week        Progress Toward Goals  OT Goals(current goals can now be found in the care plan section)  Progress towards OT goals: Progressing toward goals     Plan         AM-PAC OT "6 Clicks" Daily Activity     Outcome Measure   Help from another person eating meals?: None Help from another person taking care of personal grooming?: A Little Help from another person toileting, which includes using toliet, bedpan, or urinal?: A Lot Help from  another person bathing (including washing, rinsing, drying)?: A Lot Help from another person to put on and taking off regular upper body clothing?: A Little Help from another person to put on and taking off regular lower body clothing?: A Lot 6 Click Score: 16    End of Session Equipment Utilized During Treatment: Oxygen;Gait belt;Other (comment) (STEDY)  OT Visit Diagnosis: Unsteadiness on feet  (R26.81);Muscle weakness (generalized) (M62.81);Pain   Activity Tolerance Patient tolerated treatment well   Patient Left in chair;with call bell/phone within reach;with chair alarm set   Nurse Communication Mobility status        Time: [id number] OT Time Calculation (min): 27 min  Charges: OT General Charges $OT Visit: 1 Visit OT Treatments $Self Care/Home Management : 23-37 mins  Wynette Heckler, MS Acute Rehabilitation Department Office# [phone]   Jame Maze 07/20/2023, 10:18 AM

## 2023-07-20 NOTE — Progress Notes (Signed)
 It looks like [name] will be gone over to the CIR today.  Hopefully, they will have a bed for him.  Everything was approved.  I think that he will do well over there.  He is very motivated to improve his independence in his functional state.  He has had no problems going to the bathroom.  His appetite is doing okay.  He is eating well.  He has had no lab work for about 4 or 5 days.  We really need to get some lab work on him to see how everything looks.  He has had no fever.  He has had no bleeding.  There has been no nausea or vomiting.  The strength in his left hip continues to improve slowly but surely.  His vital signs show temperature of 98.2.  Pulse 78.  Blood pressure 104/81.  His lungs sound clear bilaterally.  He has good air movement bilaterally.  I do not hear any wheezing.  Cardiac exam regular rate and rhythm.  He has no murmurs.  Abdomen is soft.  Bowel sounds are present.  He has no fluid wave.  There is no palpable liver or spleen tip.  Extremities shows some slight weakness in the left hip area.  However this does seem to be better.  He has decent range of motion of his extremities.  Neurological exam is nonfocal.  Mr. Yeley does have the metastatic non-small cell lung cancer.  This is under decent control.  He has been on chemotherapy for this.  He had the kyphoplasty in the lumbar spine.  This dramatically improved his pain.  Now, he is going to have inpatient rehab to try to get his independence and mobility better.  We will continue to follow him along over at the CIR.  I do appreciate everybody's help on 4 W.  The staff have done a really good job with him.  Rayleen Cal, MD  Psalm 91:1-2

## 2023-07-20 NOTE — Progress Notes (Signed)
 Physical Medicine and Rehabilitation Consult Reason for Consult: Evaluate appropriateness for Inpatient Rehab Referring Physician: Dr. Ada Acres       HPI: John [name] is a 63 y.o. male with PMHx of  has a past medical history of Arthritis, Bacteremia due to Pseudomonas, C. difficile colitis, Calculus of GB and bile duct w ac and chr cholecyst w obst (06/08/2023), Chronic anxiety (11/08/2014), Cough (09/22/2021), COVID (2020), Depression, Drug-induced neutropenia (HCC) (08/11/2020), Family history of adverse reaction to anesthesia, GERD (gastroesophageal reflux disease), History of radiation therapy (04/24/2020-05/16/2020), History of radiation therapy, History of radiation therapy, Neutropenia (HCC) (05/12/2020), Neutropenic fever (HCC) (05/12/2020), Normocytic anemia (04/18/2021), PAF (paroxysmal atrial fibrillation) (HCC), Rheumatoid aortitis, Secondary hypercoagulable state (HCC) (10/28/2020), Sepsis (HCC) (10/06/2020), Shortness of breath (12/18/2020), and Squamous cell lung cancer (HCC). . They were admitted to [facility] on 07/02/2023 for back pain and shortness of breath.  Note he had been hospitalized for pneumonia already within the last month. A few days before hospitalization, he noticed weakness in his left leg and pain which was progressive, along with intractable back pain.  In the ER, workup showed leukopenia 1.3, chest x-ray with right basilar consolidation, CTA with signs of multifocal pneumonia but no PE.  He was admitted to the hospital for acute on chronic hypoxic respiratory failure.  Oncology Dr. Maria Shiner was consulted for neutropenia in the setting of stage IV lung cancer, on chemotherapy, along with severe back pain, who recommended Neupogen  and MRI.  MRI thoracic and lumbar spine 5/20 showed acute compression fracture with 60% height loss of L2, and chronic compression fractures of T12, L1, L3, L4, and L5; additionally showed moderate bilateral L4 neuroforaminal narrowing, and L3-4  disc protrusion potentially affecting the left L3 nerve.  He went L2 kyphoplasty with interventional radiology on 5-23.  Hospitalization has been otherwise complicated by intractable back pain on fentanyl  patch with as needed IV and p.o. 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  PM&R was consulted to evaluate appropriateness for IPR admission.      Per chart review, prior to admission patient was independent for ADLs and modified independent for mobility with a rolling walker, with some assistance for managing oxygen for which he is on 3 L baseline.  Patient lives in a single-story home with 4 steps to enter, with his wife who is able to work from home 2 days per week; also has a Theatre stage manager they are hiring to assist 3-4 days per week for specific tasks like bathing and cooking.  Currently, he is contact-guard assist to supervision for bed mobility and transfers with a steady, but has not attempted gait per chart review.  He has stood for 2+ minutes in the parallel bars with knees locked in extension but was limited by deconditioning and left quad weakness 3-/5.  He tolerated a 1 hour-long therapy session earlier today, and is reported very motivated to participate in therapies.   Review of Systems  Constitutional:  Negative for chills and fever.  Eyes:  Negative for blurred vision and double vision.  Respiratory:  Positive for shortness of breath. Negative for cough.   Cardiovascular:  Positive for orthopnea. Negative for chest pain and palpitations.  Gastrointestinal:  Negative for constipation, diarrhea, nausea and vomiting.  Genitourinary:  Negative for dysuria and urgency.  Musculoskeletal:  Positive for back pain,  falls, joint pain and neck pain.  Neurological:  Positive for tingling,  sensory change and focal weakness. Negative for dizziness and headaches.  Psychiatric/Behavioral:  Negative for depression. The patient is nervous/anxious.         Past Medical History:  Diagnosis Date   Arthritis      Rheumatoid   Bacteremia due to Pseudomonas     C. difficile colitis     Calculus of GB and bile duct w ac and chr cholecyst w obst 06/08/2023   Chronic anxiety 11/08/2014   Cough 09/22/2021   COVID 2020    mild case   Depression     Drug-induced neutropenia (HCC) 08/11/2020   Family history of adverse reaction to anesthesia      brother with seizures had episode under anesthesia.  Patient has seizures as well.   GERD (gastroesophageal reflux disease)     History of radiation therapy 04/24/2020-05/16/2020    IMRT to right lung     Dr Retta Caster   History of radiation therapy      08/10/21-08/19/21-Dr. Retta Caster   History of radiation therapy      Abdomen- 03/21/23-03/31/23-Dr. Retta Caster   Neutropenia [facility]) 05/12/2020   Neutropenic fever (HCC) 05/12/2020   Normocytic anemia 04/18/2021   PAF (paroxysmal atrial fibrillation) (HCC)      CHADS2VSAC score 3   Rheumatoid aortitis     Secondary hypercoagulable state (HCC) 10/28/2020   Sepsis (HCC) 10/06/2020   Shortness of breath 12/18/2020   Squamous cell lung cancer (HCC)               Past Surgical History:  Procedure Laterality Date   BRONCHIAL BRUSHINGS   03/27/2020    Procedure: BRONCHIAL BRUSHINGS;  Surgeon: Denson Flake, MD;  Location: [facility] ENDOSCOPY;  Service: Cardiopulmonary;;   BUBBLE STUDY   10/13/2020    Procedure: BUBBLE STUDY;  Surgeon: Hazle Lites, MD;  Location: [facility] ENDOSCOPY;  Service: Cardiovascular;;   CARDIOVERSION N/A 10/13/2020    Procedure: CARDIOVERSION;  Surgeon: Hazle Lites, MD;  Location: [facility] ENDOSCOPY;  Service: Cardiovascular;  Laterality: N/A;   CATARACT EXTRACTION   2016    at [facility]   CHOLECYSTECTOMY N/A 06/08/2023    Procedure: LAPAROSCOPIC CHOLECYSTECTOMY;  Surgeon:  Junie Olds, MD;  Location: [facility] ORS;  Service: General;  Laterality: N/A;   FINE NEEDLE ASPIRATION   03/27/2020    Procedure: FINE NEEDLE ASPIRATION;  Surgeon: Denson Flake, MD;  Location: [facility] ENDOSCOPY;  Service: Cardiopulmonary;;   HIP SURGERY Left     IR KYPHO EA ADDL LEVEL THORACIC OR LUMBAR   10/12/2021   IR KYPHO LUMBAR INC FX REDUCE BONE BX UNI/BIL CANNULATION INC/IMAGING   10/12/2021   IR KYPHO LUMBAR INC FX REDUCE BONE BX UNI/BIL CANNULATION INC/IMAGING   07/08/2023   IR KYPHO THORACIC WITH BONE BIOPSY   09/07/2021   IR RADIOLOGIST EVAL & MGMT   09/03/2021   RADIOLOGY WITH ANESTHESIA N/A 10/12/2021    Procedure: L1 and L3 Kyphoplasty;  Surgeon: de Macedo Rodrigues, Katyucia, MD;  Location: [facility] OR;  Service: Radiology;  Laterality: N/A;   TEE WITHOUT CARDIOVERSION N/A 10/13/2020    Procedure: TRANSESOPHAGEAL ECHOCARDIOGRAM (TEE);  Surgeon: Hazle Lites, MD;  Location: [facility] ENDOSCOPY;  Service: Cardiovascular;  Laterality: N/A;   VIDEO BRONCHOSCOPY WITH ENDOBRONCHIAL ULTRASOUND N/A 03/27/2020    Procedure: VIDEO BRONCHOSCOPY WITH ENDOBRONCHIAL ULTRASOUND;  Surgeon: Denson Flake, MD;  Location: [facility] ENDOSCOPY;  Service: Cardiopulmonary;  Laterality: N/A;  Family History  Problem Relation Age of Onset   Arrhythmia Mother          has PPM   Heart disease Father          Died at 16, started in his 64s, heart attacks, had PPM and ICD        Social History:  reports that he quit smoking about 7 years ago. His smoking use included cigarettes. He started smoking about 37 years ago. He has a 30 pack-year smoking history. He has been exposed to tobacco smoke. He has never used smokeless tobacco. He reports that he does not currently use alcohol. He reports that he does not use drugs. Allergies:  Allergies       Allergies  Allergen Reactions   Ventolin  [Albuterol ] Other (See Comments)      Patient has had episode of atrial fib following administration of albuterol   (tolerates Xopenex  well)   Lipitor [Atorvastatin ] Other (See Comments)      Arthralgias    Oxycodone  Other (See Comments)      Severe Migranes   Ketamine  Anxiety      "Feeling of doom"            Medications Prior to Admission  Medication Sig Dispense Refill   acetaminophen  (TYLENOL ) 500 MG tablet Take 500 mg by mouth daily as needed for moderate pain (pain score 4-6), fever or headache.       amiodarone  (PACERONE ) 200 MG tablet START by taking 2 tablets (400mg ) daily for 2 weeks THEN reduce to 1 tablet (200mg ) daily (Patient taking differently: Take 200 mg by mouth daily.) 90 tablet 3   benzonatate  (TESSALON ) 200 MG capsule Take 1 capsule (200 mg total) by mouth 3 (three) times daily as needed. 45 capsule 1   ELIQUIS  5 MG TABS tablet TAKE 1 TABLET(5 MG) BY MOUTH TWICE DAILY (Patient taking differently: Take 5 mg by mouth 2 (two) times daily.) 60 tablet 5   Evolocumab  (REPATHA  SURECLICK) 140 MG/ML SOAJ ADMINISTER 1 ML UNDER THE SKIN EVERY 14 DAYS 6 mL 3   HYDROcodone  bit-homatropine (HYDROMET) 5-1.5 MG/5ML syrup Take 5 mLs by mouth every 6 (six) hours as needed for cough. 120 mL 0   lidocaine  (LIDODERM ) 5 % Place 1 patch onto the skin daily. Remove & Discard patch within 12 hours or as directed by MD 30 patch 4   metoprolol  succinate (TOPROL -XL) 25 MG 24 hr tablet TAKE 1 TABLET(25 MG) BY MOUTH DAILY 90 tablet 3   ondansetron  (ZOFRAN ) 8 MG tablet Take 1 tablet (8 mg total) by mouth every 8 (eight) hours as needed for nausea or vomiting. 20 tablet 3   oxyCODONE -acetaminophen  (PERCOCET/ROXICET) 5-325 MG tablet Take 1-2 tablets by mouth every 6 (six) hours as needed. 90 tablet 0   PARoxetine  (PAXIL ) 10 MG tablet Take 1 tablet (10 mg total) by mouth at bedtime. 90 tablet 2   polyethylene glycol (MIRALAX  / GLYCOLAX ) 17 g packet Take 17 g by mouth daily as needed for mild constipation.       predniSONE  (DELTASONE ) 5 MG tablet Take 1 tablet (5 mg total) by mouth daily with breakfast. 90 tablet 4    prochlorperazine  (COMPAZINE ) 10 MG tablet Take 1 tablet (10 mg total) by mouth every 6 (six) hours as needed for nausea or vomiting. 30 tablet 3   TRELEGY ELLIPTA  100-62.5-25 MCG/ACT AEPB INHALE 1 PUFF INTO THE LUNGS DAILY 60 each 5          Home: Home Living  Family/patient expects to be discharged to:: Private residence Living Arrangements: Spouse/significant other Available Help at Discharge: Family, Available PRN/intermittently Type of Home: House Home Access: Stairs to enter [employer] of Steps: 4 Entrance Stairs-Rails: Left Home Layout: One level Bathroom Shower/Tub: Tub/shower unit, Engineer, building services: Standard Bathroom Accessibility: Yes Home Equipment: Agricultural consultant (2 wheels), BSC/3in1, Shower seat, Hand held shower head, Wheelchair - manual Additional Comments: works from home and wife able to work from home  Functional History: Prior Function Prior Level of Function : Independent/Modified Independent Mobility Comments: uses rw; O2 dependent at 3L. recently needing more assist. (since recent hospital admission - choleycystectomy ADLs Comments: uses O2 at home prn 3 lts, independent pta Functional Status:  Mobility: Bed Mobility Overal bed mobility: Needs Assistance Bed Mobility: Rolling, Sidelying to Sit Rolling: Used rails, Supervision Sidelying to sit: Contact guard assist, Used rails (Heavy reliance on side rail to transition to upright sitting) Sit to sidelying: Min assist, Used rails, Contact guard assist (for B LE's) General bed mobility comments: Without rails, pt would require Min/ModA for sidelying>sit Transfers Overall transfer level: Needs assistance Equipment used: Ambulation equipment used Transfers: Sit to/from Stand, Bed to chair/wheelchair/BSC Sit to Stand: From elevated surface, Via lift equipment, Contact guard assist (Stedy) Bed to/from chair/wheelchair/BSC transfer type:: Via Lift equipment (Stedy) Squat pivot transfers: Contact  guard assist  Lateral/Scoot Transfers: Contact guard assist Transfer via Lift Equipment: Stedy General transfer comment: Pt able to come to standing from high elevated bed and heavy reliance from upper body to pull up on "Stedy" bar. Ambulation/Gait General Gait Details: N/T, High fall risk for buckling Wheelchair Mobility Wheelchair mobility: Yes Wheelchair propulsion: Both upper extremities, Right lower extremity Wheelchair parts: Needs assistance Distance: 200 Wheelchair Assistance Details (indicate cue type and reason): Unable to actively assist with L LE, cues to initiate brakes prior to transferring from w/c and benefits of frequent rest breaks   ADL: ADL Overall ADL's : Needs assistance/impaired Eating/Feeding: Bed level, Modified independent Grooming: Sitting, Wash/dry face, Oral care Grooming Details (indicate cue type and reason): sitting EOB with increased time. after completion of task patient reported that he had dizziness. BP was checked with 123/90 mmhg. Upper Body Bathing: Moderate assistance, Bed level Lower Body Bathing: Moderate assistance, Bed level Upper Body Dressing : Moderate assistance, Bed level Lower Body Dressing: Moderate assistance, Bed level Lower Body Dressing Details (indicate cue type and reason): patient reported he was unable to figure four either leg. AE bag retrieved only for patient to figure four leg while attempting to get sock aid over R foot. patient was educated that this was used to prevent need to reach feet. patient verbalized understanding reporting that he is unable to reach his LLE like that but then noted to pull it up onto lap with shakiness noted of LLE. patient again educated on how AE was useful to prevent need to use BUE to maintain figure four position and possible discomfort at back. patient verbalized understanding, Toilet Transfer: Moderate assistance Toilet Transfer Details (indicate cue type and reason): scoot transfer to R side  from reclienr to w/c and w/c to bed with increased time. General ADL Comments: patient was able to scoot two scoots up to [facility] while sitting EOB with CGA   Cognition: Cognition Orientation Level: Oriented X4 Cognition Arousal: Alert Behavior During Therapy: [facility] for tasks assessed/performed   Blood pressure 113/70, pulse 77, temperature 98.5 F (36.9 C), temperature source Oral, resp. rate 17, height 5\' 11"  (1.803 m), weight 106.1 kg,  SpO2 95%. Physical Exam   Constitutional: No apparent distress. Appropriate appearance for age.  Sitting upright in bedside chair. HENT: No JVD. Neck Supple. Trachea midline. Atraumatic, normocephalic. Eyes: PERRLA. EOMI. Visual fields grossly intact.  Cardiovascular: RRR, no edema. Peripheral pulses 2+  Respiratory: CTAB. No rales, rhonchi, or wheezing.  Somewhat diminished in the bilateral bases.  On 3 L nasal cannula. Abdomen: + bowel sounds, normoactive. No distention or tenderness.  Skin: C/D/I.  Some healing scabs on right chin, bilateral upper extremities.  Prior kyphoplasty site healed.   MSK:      No apparent deformity.  Full passive range of motion all 4 limbs. + Arthritic changes in the hands.       Neurologic exam:  Cognition: AAO to person, place, time and event.  Language: Fluent, No substitutions or neoglisms. No dysarthria. Names 3/3 objects correctly.  Memory: Recalls 3/3 objects at 5 minutes. No apparent deficits  Insight: Good  insight into current condition.  Mood: Pleasant affect, appropriate mood.  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 Negative Hoffman's and babinski signs bilaterally.  CN: 2-12 grossly intact.  Coordination: No apparent tremors. No ataxia on FTN, HTS bilaterally.  Spasticity: MAS 0 in all extremities.       Strength:                RUE: 4/5 SA, 5/5 EF, 5/5 EE, 5/5 WE, 5/5 FF, 5/5  FA                LUE:  4/5 SA, 5/5 EF, 5/5 EE, 5/5 WE, 5/5 FF, 5/5 FA                RLE: 4/5 HF, 4/5 KE, 5/5  DF, 5/5  EHL, 5/5  PF                 LLE:  3-/5 HF, 3+/5 KE, 4-/5  DF, 4-/5  EHL, 4+/5  PF      Lab Results Last 24 Hours       Results for orders placed or performed during the hospital encounter of 07/02/23 (from the past 24 hours)  CBC with Differential/Platelet     Status: Abnormal    Collection Time: 07/16/23  4:26 AM  Result Value Ref Range    WBC 7.4 4.0 - 10.5 K/uL    RBC 3.48 (L) 4.22 - 5.81 MIL/uL    Hemoglobin 10.7 (L) 13.0 - 17.0 g/dL    HCT 16.1 (L) 09.6 - 52.0 %    MCV 95.7 80.0 - 100.0 fL    MCH 30.7 26.0 - 34.0 pg    MCHC 32.1 30.0 - 36.0 g/dL    RDW 04.5 (H) 40.9 - 15.5 %    Platelets 228 150 - 400 K/uL    nRBC 0.0 0.0 - 0.2 %    Neutrophils Relative % 81 %    Neutro Abs 6.1 1.7 - 7.7 K/uL    Lymphocytes Relative 8 %    Lymphs Abs 0.6 (L) 0.7 - 4.0 K/uL    Monocytes Relative 9 %    Monocytes Absolute 0.6 0.1 - 1.0 K/uL    Eosinophils Relative 0 %    Eosinophils Absolute 0.0 0.0 - 0.5 K/uL    Basophils Relative 1 %    Basophils Absolute 0.0 0.0 - 0.1 K/uL    Immature Granulocytes 1 %  Abs Immature Granulocytes 0.06 0.00 - 0.07 K/uL  Comprehensive metabolic panel with GFR     Status: Abnormal    Collection Time: 07/16/23  4:26 AM  Result Value Ref Range    Sodium 133 (L) 135 - 145 mmol/L    Potassium 3.4 (L) 3.5 - 5.1 mmol/L    Chloride 101 98 - 111 mmol/L    CO2 26 22 - 32 mmol/L    Glucose, Bld 98 70 - 99 mg/dL    BUN 19 8 - 23 mg/dL    Creatinine, Ser 4.09 0.61 - 1.24 mg/dL    Calcium  8.3 (L) 8.9 - 10.3 mg/dL    Total Protein 5.9 (L) 6.5 - 8.1 g/dL    Albumin  3.3 (L) 3.5 - 5.0 g/dL    AST 25 15 - 41 U/L    ALT 35 0 - 44 U/L    Alkaline Phosphatase 73 38 - 126 U/L    Total Bilirubin 0.9 0.0 - 1.2 mg/dL    GFR, Estimated >81 >19 mL/min    Anion gap 6 5 - 15  Magnesium      Status: None    Collection Time: 07/16/23  4:26 AM  Result  Value Ref Range    Magnesium  2.2 1.7 - 2.4 mg/dL  Phosphorus     Status: None    Collection Time: 07/16/23  4:26 AM  Result Value Ref Range    Phosphorus 3.3 2.5 - 4.6 mg/dL      Imaging Results (Last 48 hours)  No results found.     Assessment/Plan: Diagnosis: Left lumbar radiculopathy status post L2 compression fracture with kyphoplasty Does the need for close, 24 hr/day medical supervision in concert with the patient's rehab needs make it unreasonable for this patient to be served in a less intensive setting? Yes Co-Morbidities requiring supervision/potential complications: L2 compression fracture with intractable back pain on fentanyl  patch with as needed IV and p.o. 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 Due to bladder management, bowel management, safety, skin/wound care, disease management, medication administration, pain management, and patient education, does the patient require 24 hr/day rehab nursing? Yes Does the patient require coordinated care of a physician, rehab nurse, therapy disciplines of PT, OT to address physical and functional deficits in the context of the above medical diagnosis(es)? Yes Addressing deficits in the following areas: balance, endurance, locomotion, strength, transferring, bathing, dressing, grooming, and toileting Can the patient actively participate in an intensive therapy program of at least 3 hrs of therapy per day at least 5 days per week? Yes The potential for patient to make measurable gains while on inpatient rehab is excellent Anticipated functional outcomes upon discharge from inpatient rehab are modified independent  with PT, modified independent with OT. Estimated rehab length of stay to reach the above functional goals is: 7-10  days Anticipated discharge destination: Home Overall Rehab/Functional Prognosis: good   POST ACUTE RECOMMENDATIONS: This patient's condition is appropriate for continued rehabilitative care in the following setting: CIR Patient has agreed to participate in recommended program. Yes Note that insurance prior authorization may be required for reimbursement for recommended care.   Comment: John [name] is a 63 year old male presenting with intractable back pain, found to have multiple vertebral compression fractures including acute on chronic L2 compression fracture, underwent kyphoplasty and is now starting to regain  some significant function of his left lower extremity.  Patient endorses prior to the last 2 days, he was essentially flaccid in this leg, but has regained most of his sensation and is starting to regain some strength.  He is currently contact-guard assist for bed mobility and transfers, but still requiring assistance for lower body ADLs and has been unable to advance to gait, although on exam has the strength to start doing this and may be more successful with use of additional bracing devices.  He has multiple medical comorbidities listed above meet medical necessity, and intermittent assistance at home from his wife and a hired Theatre stage manager, but nobody available for 24/7 assist.  Thus, he will need to be mostly modified independent to return to home safely; feel that short rehab stay of 7 to 10 days is reasonable to reach this goal.       I have personally performed a face to face diagnostic evaluation of this patient. Additionally, I have examined the patient's medical record including any pertinent labs and radiographic images. If the physician assistant has documented in this note, I have reviewed and edited or otherwise concur with the physician assistant's documentation.   Thanks,   Bea Lime, DO 07/16/2023

## 2023-07-20 NOTE — Discharge Summary (Addendum)
 Physician Discharge Summary  Patient ID: John [name] MRN: [id number] DOB/AGE: 1960-07-15 63 y.o.  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 MEDICATIONS: As antibiotic prophylaxis, 2 g Ancef  was ordered pre-procedure and administered intravenously within 1 hour of incision. ANESTHESIA/SEDATION: Moderate (conscious) sedation was employed during this procedure. A total of Versed  3.0 mg and Fentanyl  150 mcg, 1 mg IV Dilaudid  was administered intravenously. Moderate Sedation Time: 22 minutes. The patient's level of consciousness and vital signs were monitored continuously by radiology nursing throughout the procedure under my direct supervision. FLUOROSCOPY TIME:  Reference air kerma: 717 mGy COMPLICATIONS: None PROCEDURE: Following a full explanation of the procedure along with the potentially associated complications, a witnessed informed consent was obtained. Specific risks that were discussed included bleeding, infection, injury to adjacent structures, neurologic injury, embolization of cement within the veins, failure of the procedure to improve pain, need for further procedure/ surgery, cardiopulmonary collapse, death. The patient understands the risks and wishes to proceed.  The patient was placed prone on the fluoroscopic table. Nasal oxygen was administered. Physiologic monitoring was performed throughout the duration of the procedure. The skin overlying the L2 region was prepped and draped in the usual sterile fashion. The vertebral body was identified and the right pedicle was infiltrated with 1% lidocaine . This was then followed by the advancement of an 8-gauge Medtronic needle through the right pedicle into the mid to posterior 1/3. The left pedicle was then infiltrated with 1% lidocaine . A small incision was made with 11 blade scalpel, and then a Medtronic 8 gauge needle was advanced through the left pedicle into the posterior 1/3 of the vertebral body. Simultaneous inflation of the left and right pedicular cannula balloons was performed under fluoroscopic observation. Methylmethacrylate mixture was then reconstituted. Under biplane intermittent fluoroscopy, the methylmethacrylate was then injected into the vertebral body with filling of the fracture cleft, and excellent radiographic left-to-right filling. No extravasation was noted posteriorly into the spinal canal. Trace right-sided venous contamination was seen. The needles were then removed. Hemostasis was achieved at the skin entry site. There were no acute complications. Patient tolerated the procedure well. Patient was then observed in recovery before transfer. IMPRESSION: Status post image guided vertebral augmentation of L2 vertebral body, via bipedicular approach using kyphoplasty technique. Signed, Marciano Settles. Rexine Cater, RPVI [facility] Radiology Electronically Signed   By: Myrlene Asper D.O.   On: 07/08/2023 16:32   MR LUMBAR SPINE W WO CONTRAST Result Date: 07/05/2023 CLINICAL DATA:  Initial evaluation for acute back pain, metastatic disease evaluation. EXAM: MRI LUMBAR SPINE WITHOUT AND WITH CONTRAST TECHNIQUE: Multiplanar and multiecho pulse sequences of the  lumbar spine were obtained without and with intravenous contrast. CONTRAST:  10mL GADAVIST  GADOBUTROL  1 MMOL/ML IV SOLN COMPARISON:  Prior study from 06/06/2023 FINDINGS: Segmentation: Standard. Lowest well-formed disc space labeled the L5-S1 level. Alignment: Physiologic with preservation of the normal lumbar lordosis. No listhesis. Vertebrae: Acute compression  fracture extending through the inferior endplate of L2 with associated marrow edema and enhancement, new from prior. This is superimposed on underlying chronic L2 compression fracture. Associated height loss measures up to 50-60% without significant bony retropulsion this is benign/mechanical in appearance. Additional chronic compression deformities involving the T12, L1, L3, L4, and L5 vertebral bodies again seen, otherwise stable. Sequelae of prior vertebral augmentation at T12, L1, and L3. Underlying bone marrow signal intensity within normal limits. No worrisome osseous lesions or evidence for metastatic disease. No other abnormal enhancement. Conus medullaris and cauda equina: Conus extends to the T12 level. Conus and cauda equina appear normal. Paraspinal and other soft tissues: Paraspinous soft tissues demonstrate no acute finding. 1.1 cm exophytic left renal cyst noted, benign in appearance, no follow-up imaging recommended. 3.7 cm intra-abdominal aortic aneurysm noted. Disc levels: L1-2: Disc desiccation with diffuse disc bulge. Mild bilateral facet spurring. No significant spinal stenosis. Foramina remain patent. L2-3: Diffuse disc bulge with disc desiccation. Mild bilateral facet hypertrophy. Mild prominence of the dorsal epidural fat. Resultant mild spinal stenosis with mild bilateral foraminal narrowing. L3-4: Disc desiccation with mild diffuse disc bulge. Superimposed left foraminal to extraforaminal disc protrusion contacts the exiting left L3 nerve root. Mild facet and ligament flavum hypertrophy. No more than mild spinal stenosis. Mild  bilateral L3 foraminal narrowing. L4-5: Disc desiccation with mild disc bulge. Superimposed broad base left subarticular to foraminal disc protrusion (series 5, image 26). Mild facet and ligament flavum hypertrophy. Resultant moderate canal with left greater than right lateral recess stenosis. Mild to moderate bilateral L4 foraminal narrowing. L5-S1: Disc desiccation with mild disc bulge. Small posterior annular fissure. Moderate left with mild right facet hypertrophy. No significant spinal stenosis. Foramina remain adequately patent. IMPRESSION: 1. Acute compression fracture involving the inferior endplate of L2 with up to 60% height loss without significant bony retropulsion. This is superimposed on an underlying chronic L2 compression fracture, and is benign/mechanical in appearance. No evidence for metastatic disease within the lumbar spine. 2. Additional chronic compression deformities involving the T12, L1, L3, L4, and L5 vertebral bodies, otherwise stable. 3. Multifactorial degenerative changes at L4-5 with resultant moderate canal and left greater than right lateral recess stenosis, with mild to moderate bilateral L4 foraminal narrowing. 4. Left foraminal to extraforaminal disc protrusion at L3-4, potentially affecting the exiting left L3 nerve root. 5. 3.7 cm intra-abdominal aortic aneurysm. Recommend follow-up every 2 years. This recommendation follows ACR consensus guidelines: White Paper of the ACR Incidental Findings Committee II on Vascular Findings. J Am Coll Radiol 2013; 10:789-794. Electronically Signed   By: Virgia Griffins M.D.   On: 07/05/2023 02:20   MR THORACIC SPINE W WO CONTRAST Result Date: 07/05/2023 CLINICAL DATA:  Initial evaluation for severe back pain, metastatic disease evaluation EXAM: MRI THORACIC WITHOUT AND WITH CONTRAST TECHNIQUE: Multiplanar and multiecho pulse sequences of the thoracic spine were obtained without and with intravenous contrast. CONTRAST:  10mL GADAVIST   GADOBUTROL  1 MMOL/ML IV SOLN COMPARISON:  Prior studies from 07/02/2023 and earlier. FINDINGS: Alignment: Straightening of the normal thoracic kyphosis with underlying mild dextroscoliosis. No listhesis. Vertebrae: Compression deformity involving the superior endplate of T11 with mild residual marrow edema and enhancement, consistent with an evolving subacute fracture. Height loss is not significantly changed or progressed as compared to prior MRI from 06/06/2023. This is benign/mechanical in appearance. Additional chronic compression deformities involving the T12 and L1 vertebral bodies with sequelae of prior vertebral augmentation, stable. Vertebral body height otherwise maintained with no other acute or  interval fracture. Bone marrow signal intensity within normal limits. No worrisome osseous lesions or evidence for metastatic disease. No other abnormal enhancement. Cord:  Normal signal and morphology.  No abnormal enhancement. Paraspinal and other soft tissues: Paraspinous soft tissues demonstrate no acute finding. Post treatment changes with fibrotic changes noted within the partially visualized right lung. Known right adrenal metastasis noted, grossly similar to prior CT. Disc levels: T6-7: Small left paracentral disc protrusion indents the ventral thecal sac. No spinal stenosis. Foramina remain patent. T7-8: Central disc protrusion indents the ventral thecal sac. Mild flattening of the ventral cord without cord signal changes or significant spinal stenosis. Foramina remain patent. T10-11: Mild disc bulge with facet hypertrophy. No canal stenosis. Moderate right with mild left foraminal stenosis. Otherwise, minor for age spondylosis elsewhere within the thoracic spine. No other significant stenosis or impingement. IMPRESSION: 1. No evidence for metastatic disease within the thoracic spine. 2. Evolving subacute compression fracture involving the superior endplate of T11 with no more than mild residual marrow  edema. Height loss is not significantly changed or progressed as compared to prior MRI from 06/06/2023. 3. Chronic compression deformities involving the T12 and L1 vertebral bodies with sequelae of prior vertebral augmentation, stable. 4. Small disc protrusions at T6-7 and T7-8 without significant stenosis. Moderate right with mild left foraminal stenosis at T10-11. 5. Post treatment changes within the visualized right lung with known right adrenal metastasis, described on recent CTs. Electronically Signed   By: Virgia Griffins M.D.   On: 07/05/2023 02:05   DG HIP UNILAT WITH PELVIS 2-3 VIEWS LEFT Result Date: 07/04/2023 CLINICAL DATA:  Left hip pain. EXAM: DG HIP (WITH OR WITHOUT PELVIS) 2-3V LEFT COMPARISON:  KUB 07/03/2023, CT chest, abdomen, and pelvis 06/23/2023 FINDINGS: There is diffuse decreased bone mineralization. The bilateral femoroacetabular joint spaces are maintained. Mild bilateral superolateral acetabular degenerative osteophytosis. The bilateral sacroiliac and pubic symphysis joint spaces are maintained. No acute fracture or dislocation. L3 vertebral body and partially visualized L1 vertebral body augmentation cement again noted. Mild to moderate atherosclerotic calcifications. IMPRESSION: Mild bilateral femoroacetabular osteoarthritis. Electronically Signed   By: Bertina Broccoli M.D.   On: 07/04/2023 09:23   DG Abd 1 View Result Date: 07/03/2023 CLINICAL DATA:  Constipation. Patient reports left bowel movement was 5 days ago. EXAM: ABDOMEN - 1 VIEW COMPARISON:  CT 06/23/2023 FINDINGS: Diminished stool burden from prior CT. No significant formed stool throughout the colon. There is scattered air throughout nondilated small bowel in the central abdomen. Cholecystectomy clips in the right upper quadrant. Excreted IV contrast in the urinary bladder from yesterday's chest CTA. Multilevel vertebral augmentation. IMPRESSION: 1. No significant formed stool in the colon. 2. Air scattered  throughout nondilated small bowel, query ileus. Electronically Signed   By: Chadwick Colonel M.D.   On: 07/03/2023 14:55   CT Angio Chest PE W and/or Wo Contrast Result Date: 07/02/2023 CLINICAL DATA:  Right-sided chest pain. EXAM: CT ANGIOGRAPHY CHEST WITH CONTRAST TECHNIQUE: Multidetector CT imaging of the chest was performed using the standard protocol during bolus administration of intravenous contrast. Multiplanar CT image reconstructions and MIPs were obtained to evaluate the vascular anatomy. RADIATION DOSE REDUCTION: This exam was performed according to the departmental dose-optimization program which includes automated exposure control, adjustment of the mA and/or kV according to patient size and/or use of iterative reconstruction technique. CONTRAST:  75mL OMNIPAQUE  IOHEXOL  350 MG/ML SOLN COMPARISON:  CT angiogram chest abdomen and pelvis 06/23/2023. FINDINGS: Cardiovascular: Satisfactory opacification of the pulmonary arteries to the  segmental level. No evidence of pulmonary embolism. Normal heart size. No pericardial effusion. Mediastinum/Nodes: No enlarged mediastinal, hilar, or axillary lymph nodes. Thyroid  gland, trachea, and esophagus demonstrate no significant findings. Lungs/Pleura: Mild emphysema again noted in the lung apices. Multifocal areas of ground-glass opacity are again seen throughout both lungs along with some peripheral reticular opacities. Fibrotic changes are again noted in the right lung base. There is some focal airspace opacity in the right perihilar region and superior segment of the right lower lobe with some central cavitation. There is no pleural effusion or pneumothorax. Compared to 06/23/2023 there has been no significant interval change. Upper Abdomen: No acute abnormality. Cholecystectomy clips are present. Infrarenal abdominal aortic aneurysm measures up to 3.3 cm and appears unchanged. Musculoskeletal: No acute osseous findings. Review of the MIP images confirms the  above findings. IMPRESSION: 1. No evidence for pulmonary embolism. 2. Stable multifocal areas of ground-glass opacity throughout both lungs along with some peripheral reticular opacities. Findings are favored as infectious/inflammatory. 3. Stable focal airspace opacity in the right perihilar region and superior segment of the right lower lobe with some central cavitation. Findings may be infectious/inflammatory. 4. Stable 3.3 cm infrarenal abdominal aortic aneurysm. Recommend follow-up ultrasound every 3 years. Aortic Atherosclerosis (ICD10-I70.0) and Emphysema (ICD10-J43.9). Electronically Signed   By: Tyron Gallon M.D.   On: 07/02/2023 17:48   CT T-SPINE NO CHARGE Result Date: 07/02/2023 CLINICAL DATA:  Lower back and bilateral lower extremity pain, stage IV lung cancer EXAM: CT Thoracic Spine with contrast TECHNIQUE: Multiplanar CT images of the thoracic spine were reconstructed from contemporary CT of the Chest. RADIATION DOSE REDUCTION: This exam was performed according to the departmental dose-optimization program which includes automated exposure control, adjustment of the mA and/or kV according to patient size and/or use of iterative reconstruction technique. CONTRAST:  No additional COMPARISON:  06/23/2023 FINDINGS: Alignment: Alignment is grossly anatomic. Vertebrae: Stable compression deformities at T10, T11, T12, L1, and L2. Prior vertebral augmentations seen at T12 and L1. There are no acute displaced fractures. No destructive bony abnormalities. Paraspinal and other soft tissues: Paraspinal soft tissues are unremarkable. Continue consolidation cavitation within the right lower lobe. Please see separate CT chest report for findings in that region. Disc levels: There is no significant bony encroachment upon the central canal or neural foramina. Reconstructed images demonstrate no additional findings. IMPRESSION: 1. Chronic compression deformities from T10 through L2, with evidence of prior vertebral  augmentation at T12 and L1. No acute thoracic spine fracture. 2. Please refer to separate CT chest report for findings in that region. Electronically Signed   By: Bobbye Burrow M.D.   On: 07/02/2023 17:47   DG Chest Port 1 View Result Date: 07/02/2023 CLINICAL DATA:  Lower back and bilateral lower extremity pain, stage IV lung cancer EXAM: PORTABLE CHEST 1 VIEW COMPARISON:  06/23/2023 FINDINGS: Single frontal view of the chest demonstrates a stable cardiac silhouette. Fibrotic changes in the right lung are stable, consistent with post treatment appearance in this patient with history of lung cancer. There is progressive consolidation at the right lung base which may reflect new airspace disease or atelectasis. No effusion or pneumothorax. Left chest is clear. No acute bony abnormalities. IMPRESSION: 1. Increasing right basilar consolidation superimposed upon chronic post radiation fibrosis, which may reflect acute airspace disease or atelectasis. Electronically Signed   By: Bobbye Burrow M.D.   On: 07/02/2023 14:48    Labs:  Basic Metabolic Panel: Recent Labs  Lab 07/24/23 0737 07/28/23 0619  NA  136 140  K 3.6 3.7  CL 104 105  CO2 27 27  GLUCOSE 125* 104*  BUN 12 10  CREATININE 0.95 0.98  CALCIUM  8.1* 8.7*    CBC: Recent Labs  Lab 07/28/23 0619  WBC 5.6  NEUTROABS 4.0  HGB 12.0*  HCT 35.1*  MCV 94.4  PLT 144*    CBG: No results for input(s): GLUCAP in the last 168 hours.  Family history.  Mother with arrhythmia father with heart disease.  Denies any colon cancer esophageal cancer or rectal cancer  Brief HPI:   John [name] is a 63 y.o. right-handed male with history significant for chronic back pain with IR kyphosis 10/12/2021, COPD quit smoking 7 years ago, chronic hypoxic respiratory failure on 3 L nasal cannula 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 Maria Shiner, cavitary lesion in the right midlung, pneumonitis 2/2  on steroids, drug-induced neutropenia rheumatoid arthritis paroxysmal atrial fibrillation on Eliquis  as well as amiodarone  followed by Dr. Harvie Liner with past attempts of cardioversion, anxiety/depression.  Per chart review patient lives with spouse.  Independent at baseline prior to recent hospitalization.  Presented [facility]/02/[facility] to [facility] with increasing back pain and shortness of breath.  Patient had reported progressive back pain over the past few months radiating to the lower extremities maintained on Percocet 5-325 mg 1 to 2 tablets every 6 hours as needed as well as Lidoderm  patch.  Noted oxygen saturation of 88% which improved with 6 L.  Chemistry showed sodium 124 potassium 3.3 chloride 91 total bilirubin 1.6 WBC 1.3 blood cultures no growth to date.  CT angiogram of the chest showed no evidence of pulmonary emboli.  Stable multifocal areas of groundglass opacity throughout both lungs along with some peripheral reticular opacities.  MRI thoracic spine showed no evidence for metastatic disease within the thoracic spine.  Evolving subacute compression fracture involving the superior endplate of T11 with no more than mild residual marrow edema.  Height loss not significantly changed or progressed as compared to prior MRI 06/06/2023.  Chronic compression deformities involving T12 and L1 vertebral bodies with sequela of prior vertebral augmentation stable.  MRI lumbar spine showed acute compression fractures involving the inferior endplate of L2 with up to 60% height loss without significant bony retropulsion.  Additional chronic compression deformities T12-L1 L3-L4 and L5 vertebral bodies otherwise stable.  Left foraminal extraforaminal disc protrusion at L3-4, potentially affecting the exiting left L3 nerve root.  There was a 3.7 cm intra-abdominal aortic aneurysm recommendations of follow-up every 2 years.  Underwent image guided L2 vertebral augmentation kyphoplasty technique 07/08/2023 per  Dr. Blinda Burger of interventional radiology.  Hospital course follow-up oncology service Dr. Maria Shiner in regards to neutropenia he did receive a trial of Neupogen  with latest WBC 7.4.  Patient remained on chronic Eliquis  for history of atrial fibrillation.  Hyponatremia improved with sodium chloride  tablet.  Palliative care consulted to establish goals of care.  Therapy evaluations completed due to patient decreased functional mobility was admitted for a comprehensive rehab program   Hospital Course: John [name] was admitted to rehab 07/20/2023 for inpatient therapies to consist of PT, ST and OT at least three hours five days a week. Past admission physiatrist, therapy team and rehab RN have worked together to provide customized collaborative inpatient rehab.  Pertaining to patient's left lumbar radiculopathy status post L2 compression fracture with kyphoplasty 07/08/2023.  Patient's back pain much improved follow-up interventional radiology.  He remained  on chronic Eliquis  for history of PAF followed by cardiology services cardiac rate controlled and would continue amiodarone  as well as Toprol .  Pain management with use of Duragesic  patch Lidoderm  patch with Robaxin  and Dilaudid  as needed.  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 Maria Shiner.  Drug-induced neutropenia improved after Neupogen  follow-up hematology services.  Chronic hypoxic respiratory failure COPD remote tobacco use patient on 3 L oxygen nasal cannula prior to admission monitoring of oxygen saturations maintained on prednisone  therapy.  Incidental finding of a 3.7 cm intra-abdominal aortic aneurysm during workup of back pain recommendations outpatient follow-up every 2 years.  Mood stabilization with Paxil  patient was encouraged with overall progress and emotional support provided.  Initial bouts hyponatremia 128-130 placed on sodium chloride  tablets since improved to [phone] and sodium  chloride tablets discontinued.   Blood pressures were monitored on TID basis and remained controlled and monitored     Rehab course: During patient's stay in rehab weekly team conferences were held to monitor patient's progress, set goals and discuss barriers to discharge. At admission, patient required contact-guard assist lateral scoot transfers contact-guard squat pivot transfers minimal assist 30 feet rolling walker  He/She  has had improvement in activity tolerance, balance, postural control as well as ability to compensate for deficits. He/She has had improvement in functional use RUE/LUE  and RLE/LLE as well as improvement in awareness.  Working with energy conservation techniques.  Focusing on improving patient's standing tolerance monitoring oxygen saturations.  Patient able to demonstrate supervision with wheelchair commode transfers good standing balance to don pants.  Completed bed mobility modified independent for supine to sit no reports of dizziness during sessions.  Completed several short distance bouts of gait with overall supervision to contact-guard for balance with rollator.  Toileting task with distant supervision.  Shower transfers close supervision.  Upper body dressing standby assist donning doffing overhead shirt while seated as well as lower body dressing standby assist seated on T TB for donning doffing overhead shirt.  Patient did display improved awareness of activity tolerance limitations.  Full family teaching completed plan discharge to home       Disposition:  Discharge disposition: 06-Home-Health Care Svc        Diet: Regular  Special Instructions: No driving smoking or alcohol  Follow-up outpatient workup of 3.7 cm intra-abdominal aortic aneurysm identified during workup of back pain  Continue chronic oxygen therapy as directed  Medications at discharge. 1.  Tylenol  as needed 2.  Amiodarone  200 mg p.o. daily 3.  Eliquis  5 mg p.o. twice daily 4.   Trelegy Ellipta  100-60 2.5-25 mcg 1 puff into the lungs daily 5.  Durogesic patch change every 72 hours 6.  Tramadol  100 mg every 6 hours as needed pain 7.  Lidoderm  patch changes directed 8.  Robaxin  1000 mg every 6 hours as needed muscle spasms 9.  Toprol -XL 12.5 mg p.o. daily 10.  Paxil  10 mg p.o. nightly 11.  MiraLAX  daily as needed hold for loose stools 12.  Prednisone  5 mg daily 13.  Vitamin D  50,000 units every 7 days 14.  Repatha  1 mL under the skin every 14 days 15.  Tessalon  200 mg p.o. 3 times daily as needed 16.  Protonix  40 mg daily   30-35 minutes were spent completing discharge summary and discharge planning     Follow-up Information     Lovorn, Jacqlyn Matas, MD Follow up.   Specialty: Physical Medicine and Rehabilitation Why: No formal  follow-up needed Contact information: [address] [place] [address] [phone]         Myrlene Asper, DO Follow up.   Specialties: Interventional Radiology, Radiology Why: Call for appointment as needed Contact information: [address] [place] [address] [phone]         Ivor Mars, MD Follow up.   Specialty: Oncology Why: Call for appointment Contact information: [address] [place] [address] [phone]         Boyce Byes, MD Follow up.   Specialties: Cardiology, Radiology Why: Call for appointment Contact information: [address] [place] [address] [phone]                 Signed: Sterling Eisenmenger 07/29/2023, 4:34 AM

## 2023-07-20 NOTE — Progress Notes (Signed)
 PMR Admission Coordinator Pre-Admission Assessment   Patient: John [name] is an 63 y.o., male MRN: [id number] DOB: [birth year] Height: 5\' 11"  (180.3 cm) Weight: 106.1 kg                                                                                                                                                  Insurance Information HMO: yes    PPO:      PCP:      IPA:      80/20:      OTHER:  PRIMARY: [employer] Managed      Policy#: [id number]      Subscriber: patient CM Name: Sherrlyn Dolores      Phone#: [phone] ext [phone]      Fax#: [phone] concurrent reviewer is Gorden Latino fax: [phone] Pre-Cert#: [id number] approved for 7 days 6/3 until 6/10     Employer:  Benefits:  Phone #: [phone]     Name:  Eff. Date: 11/15/21-02/15/24     Deduct: $1,500 ($1,500 met)      Out of Pocket Max: $5,000 ($5,000 met)      Life Max: NA  CIR: 70% coverage, 30% co-insurance      SNF: 70% coverage, 30% co-insurance Outpatient: $50 co-pay/visit     Co-Pay:  Home Health: 70% coverage      Co-Pay: 30% co-insurance DME: 70% coverage     Co-Pay: 30% co-insurance Providers: in-network SECONDARY:       Policy#:       Phone#:    Artist:       Phone#:    The Data processing manager" for patients in Inpatient Rehabilitation Facilities with attached "Privacy Act Statement-Health Care Records" was provided and verbally reviewed with: Patient   Emergency Contact Information Contact Information       Name Relation Home Work Mobile    [place] Spouse [phone]   [phone]         Other Contacts   None on File      Current Medical History  Patient Admitting Diagnosis: Left lumbar radiculopathy s/p L2 compression fx with kyphoplasty   History of Present Illness: John [name] is a 63 year old right handed male with history significant for chronic back pain with IR kyphosis 10/12/2021, COPD quit smoking 7 years ago, chronic hypoxic respiratory failure on 3 L nasal  cannula, 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 Harvie Liner with past attempts at cardioversion, anxiety/depression.  Per chart review patient lives with spouse.  1 level home 4 steps to entry.  Patient reportedly independent at baseline prior to recent hospitalization.  Presented 07/02/2023 to [facility]  with increasing back pain and shortness of breath.  Patient had reported progressive back pain over the past few months radiating to the lower extremities maintained on Percocet 5-3 25 1-2 tabs every 6 hours as needed as well as Lidoderm  patch.     Noted oxygen saturations 88% which improved with 6 L.  Chemistry showed sodium 124 potassium 3.3, chloride 91, total bilirubin 1.6, WBC 1.3, blood cultures no growth to date.  CT angiogram of the chest showed no evidence for pulmonary emboli.  Stable multifocal areas of groundglass opacity throughout both lungs along with some peripheral reticular opacities.  MRI thoracic spine showed no evidence for metastatic disease within the thoracic spine.  Evolving subacute compression fracture involving the superior endplate of T11 with no more than mild residual marrow edema.  Height loss not significantly changed or progressed as compared to prior MRI 06/06/2023.  Chronic compression deformities involving T12 and L1 vertebral bodies with sequela of prior vertebral augmentation stable.  MRI lumbar spine showed acute compression fractures involving the inferior endplate of L2 with up to 60% height loss without significant bony retropulsion.  Additional chronic compression deformities of all of the T12, L1, L3, L4 and L5 vertebral bodies otherwise stable.  Left foraminal to extraforaminal  disc protrusion at L3-4, potentially affecting the exiting left L3 nerve root.  There was a 3.7 cm intra-abdominal aortic aneurysm recommendations of follow-up every 2 years.  Underwent image guided L2 vertebral augmentation kyphoplasty technique 07/08/2023 per Dr. Blinda Burger of interventional radiology.  Hospital course follow-up oncology services Dr. Maria Shiner in regards to neutropenia he did receive a trial of Neupogen  with latest WBC 7.4.  Patient remains on chronic Eliquis  for history of atrial fibrillation.  Hyponatremia improved 130-134 133 maintained on sodium chloride  tablets.  Palliative care was consulted to establish goals of care.  Pain management with the use of Durogesic patch/lighted Derm patch with Robaxin  as needed and Dilaudid  3 mg every 4 hours breakthrough pain as needed.    Patient[facility] has been reviewed by the rehabilitation admission coordinator and physician.   Past Medical History      Past Medical History:  Diagnosis Date   Arthritis      Rheumatoid   Bacteremia due to Pseudomonas     C. difficile colitis     Calculus of GB and bile duct w ac and chr cholecyst w obst 06/08/2023   Chronic anxiety 11/08/2014   Cough 09/22/2021   COVID 2020    mild case   Depression     Drug-induced neutropenia (HCC) 08/11/2020   Family history of adverse reaction to anesthesia      brother with seizures had episode under anesthesia.  Patient has seizures as well.   GERD (gastroesophageal reflux disease)     History of radiation therapy 04/24/2020-05/16/2020    IMRT to right lung     Dr Retta Caster   History of radiation therapy      08/10/21-08/19/21-Dr. Retta Caster   History of radiation therapy      Abdomen- 03/21/23-03/31/23-Dr. Retta Caster   Neutropenia [facility]) 05/12/2020   Neutropenic fever (HCC) 05/12/2020   Normocytic anemia 04/18/2021   PAF (paroxysmal atrial fibrillation) (HCC)      CHADS2VSAC score 3   Rheumatoid aortitis      Secondary hypercoagulable state (HCC) 10/28/2020   Sepsis (HCC) 10/06/2020   Shortness of breath 12/18/2020   Squamous cell lung cancer (HCC)  Has the patient had major surgery during 100 days prior to admission? Yes   Family History  family history includes Arrhythmia in his mother; Heart disease in his father.   Current Medications   Current Medications    Current Facility-Administered Medications:    [EXPIRED] acetaminophen  (TYLENOL ) tablet 1,000 mg, 1,000 mg, Oral, Q8H **FOLLOWED BY** acetaminophen  (TYLENOL ) tablet 650 mg, 650 mg, Oral, Q6H PRN, Etter Hermann., MD   amiodarone  (PACERONE ) tablet 200 mg, 200 mg, Oral, Daily, Yvonne Hering, Prosper M, MD, 200 mg at 07/20/23 0900   apixaban  (ELIQUIS ) tablet 5 mg, 5 mg, Oral, BID, Unk Garb, DO, 5 mg at 07/20/23 0900   benzonatate  (TESSALON ) capsule 200 mg, 200 mg, Oral, TID PRN, Vita Grip, MD   bisacodyl  (DULCOLAX) suppository 10 mg, 10 mg, Rectal, Daily PRN, Ferolito, Michelle Y, NP, 10 mg at 07/06/23 1636   budesonide -glycopyrrolate -formoterol  (BREZTRI ) 160-9-4.8 MCG/ACT inhaler 2 puff, 2 puff, Inhalation, BID, Vita Grip, MD, 2 puff at 07/20/23 0851   feeding supplement (ENSURE ENLIVE / ENSURE PLUS) liquid 237 mL, 237 mL, Oral, TID BM, Vita Grip, MD, 237 mL at 07/18/23 2137   fentaNYL  (DURAGESIC ) 25 MCG/HR 1 patch, 1 patch, Transdermal, Q72H, Mims, Lauren W, DO, 1 patch at 07/18/23 2136   HYDROmorphone  (DILAUDID ) injection 1 mg, 1 mg, Intravenous, Q4H PRN, Mims, Lauren W, DO, 1 mg at 07/13/23 0530   HYDROmorphone  (DILAUDID ) tablet 3 mg, 3 mg, Oral, Q4H PRN, Ennever, Peter R, MD, 3 mg at 07/19/23 2147   levalbuterol  (XOPENEX ) nebulizer solution 0.63 mg, 0.63 mg, Nebulization, Q8H PRN, Vita Grip, MD   lidocaine  (LIDODERM ) 5 % 1 patch, 1 patch, Transdermal, Q24H, Vita Grip, MD, 1 patch at 07/19/23 2146   methocarbamol  (ROBAXIN ) injection 1,000 mg, 1,000 mg, Intravenous, Q6H  PRN, Daren Eck, DO, 1,000 mg at 07/10/23 0912   metoprolol  succinate (TOPROL -XL) 24 hr tablet 25 mg, 25 mg, Oral, Daily, [place], Prosper M, MD, 25 mg at 07/20/23 0900   ondansetron  (ZOFRAN ) tablet 4 mg, 4 mg, Oral, Q6H PRN **OR** ondansetron  (ZOFRAN ) injection 4 mg, 4 mg, Intravenous, Q6H PRN, Yvonne Hering, Annette Killings, MD   Oral care mouth rinse, 15 mL, Mouth Rinse, PRN, Etter Hermann., MD   pantoprazole  (PROTONIX ) EC tablet 40 mg, 40 mg, Oral, Daily, Etter Hermann., MD, 40 mg at 07/20/23 0900   PARoxetine  (PAXIL ) tablet 10 mg, 10 mg, Oral, QHS, [place], Prosper M, MD, 10 mg at 07/19/23 2146   polyethylene glycol (MIRALAX  / GLYCOLAX ) packet 17 g, 17 g, Oral, Daily, Ferolito, Michelle Y, NP, 17 g at 07/13/23 2131   [COMPLETED] predniSONE  (DELTASONE ) tablet 40 mg, 40 mg, Oral, Q breakfast, 40 mg at 07/17/23 0802 **FOLLOWED BY** [COMPLETED] predniSONE  (DELTASONE ) tablet 30 mg, 30 mg, Oral, Q breakfast, 30 mg at 07/19/23 0832 **FOLLOWED BY** predniSONE  (DELTASONE ) tablet 20 mg, 20 mg, Oral, Q breakfast, 20 mg at 07/20/23 0856 **FOLLOWED BY** [START ON 07/22/2023] predniSONE  (DELTASONE ) tablet 10 mg, 10 mg, Oral, Q breakfast **FOLLOWED BY** [START ON 07/24/2023] predniSONE  (DELTASONE ) tablet 5 mg, 5 mg, Oral, Q breakfast, [place], A Glory Larsen., MD   senna-docusate (Senokot-S) tablet 2 tablet, 2 tablet, Oral, BID, Mims, Lauren W, DO, 2 tablet at 07/20/23 0900   sodium chloride  flush (NS) 0.9 % injection 10-40 mL, 10-40 mL, Intracatheter, PRN, Vita Grip, MD   sodium chloride  tablet 1 g, 1 g, Oral, BID WC, Daren Eck, DO, 1 g at 07/20/23 [phone]  Vitamin D  (Ergocalciferol ) (DRISDOL ) 1.25 MG (50000 UNIT) capsule 50,000 Units, 50,000 Units, Oral, Q7 days, Etter Hermann., MD, 50,000 Units at 07/13/23 1158   Facility-Administered Medications Ordered in Other Encounters:    ondansetron  (ZOFRAN ) injection 8 mg, 8 mg, Intravenous, Once, Ennever, Sherryll Ottis, MD     Patients Current  Diet:  Diet Order                  Diet regular Room service appropriate? Yes; Fluid consistency: Thin  Diet effective now                       Precautions / Restrictions Precautions Precautions: Fall, Back Precaution Booklet Issued: Yes (comment) Precaution/Restrictions Comments: VC's for proper tech "log roll". Restrictions Weight Bearing Restrictions Per Provider Order: No Other Position/Activity Restrictions: Left foraminal to extraforaminal disc protrusion at L3-4, potentially affecting the exiting left L3 nerve root    Has the patient had 2 or more falls or a fall with injury in the past year?No   Prior Activity Level Community (5-7x/wk): gets out of house daily; drives   Prior Functional Level Prior Function Prior Level of Function : Independent/Modified Independent Mobility Comments: uses rw; O2 dependent at 3L. recently needing more assist. (since recent hospital admission - choleycystectomy ADLs Comments: uses O2 at home prn 3 lts, independent pta   Self Care: Did the patient need help bathing, dressing, using the toilet or eating?  Independent   Indoor Mobility: Did the patient need assistance with walking from room to room (with or without device)? Independent   Stairs: Did the patient need assistance with internal or external stairs (with or without device)? Independent   Functional Cognition: Did the patient need help planning regular tasks such as shopping or remembering to take medications? Independent   Patient Information Are you of Hispanic, Latino/a,or Spanish origin?: A. No, not of Hispanic, Latino/a, or Spanish origin What is your race?: A. White Do you need or want an interpreter to communicate with a doctor or health care staff?: 0. No   Patient's Response To:  Health Literacy and Transportation Is the patient able to respond to health literacy and transportation needs?: Yes Health Literacy - How often do you need to have someone help you when  you read instructions, pamphlets, or other written material from your doctor or pharmacy?: Never In the past 12 months, has lack of transportation kept you from medical appointments or from getting medications?: No In the past 12 months, has lack of transportation kept you from meetings, work, or from getting things needed for daily living?: No   Home Assistive Devices / Equipment Home Equipment: Agricultural consultant (2 wheels), BSC/3in1, Information systems manager, Hand held shower head, Wheelchair - manual   Prior Device Use: Indicate devices/aids used by the patient prior to current illness, exacerbation or injury? None of the above   Current Functional Level Cognition   Orientation Level: Oriented X4    Extremity Assessment (includes Sensation/Coordination)   Upper Extremity Assessment: Overall WFL for tasks assessed  Lower Extremity Assessment: Defer to PT evaluation RLE Deficits / Details: MMT ankle 4+/5, knee and hip 4+/5 RLE Sensation: history of peripheral neuropathy RLE Coordination: decreased gross motor LLE Deficits / Details: ankle pf/df 3+/5; knee extension 2+/5, knee flexion 3/5;  hip flexion 2+/5 LLE Coordination: decreased gross motor, decreased fine motor     ADLs   Overall ADL's : Needs assistance/impaired Eating/Feeding: Bed level, Modified independent Grooming: Sitting, Wash/dry  face, Oral care Grooming Details (indicate cue type and reason): sitting EOB with increased time. after completion of task patient reported that he had dizziness. BP was checked with 123/90 mmhg. Upper Body Bathing: Moderate assistance, Bed level Lower Body Bathing: Moderate assistance, Bed level Upper Body Dressing : Moderate assistance, Bed level Lower Body Dressing: Moderate assistance, Bed level Lower Body Dressing Details (indicate cue type and reason): patient reported he was unable to figure four either leg. AE bag retrieved only for patient to figure four leg while attempting to get sock aid over R  foot. patient was educated that this was used to prevent need to reach feet. patient verbalized understanding reporting that he is unable to reach his LLE like that but then noted to pull it up onto lap with shakiness noted of LLE. patient again educated on how AE was useful to prevent need to use BUE to maintain figure four position and possible discomfort at back. patient verbalized understanding, Toilet Transfer: Moderate assistance Toilet Transfer Details (indicate cue type and reason): scoot transfer to R side from reclienr to w/c and w/c to bed with increased time. General ADL Comments: Pt able to transfer from EOB to recliner utilizing Steady for additional support d/t LLE weakness and L knee buckling. Able to simulate walking for a minute or two standing in the [place] prior to transfer to recliner.     Mobility   Overal bed mobility: Needs Assistance Bed Mobility: Rolling, Sidelying to Sit Rolling: Used rails, Supervision Sidelying to sit: Supervision, Used rails Sit to sidelying: Min assist, Used rails, Contact guard assist (for B LE's) General bed mobility comments: Performed without difficulty; good safety and maintenance of back precautions     Transfers   Overall transfer level: Needs assistance Equipment used: Rolling walker (2 wheels) Transfers: Sit to/from Stand, Bed to chair/wheelchair/BSC Sit to Stand: Min assist, +2 physical assistance Bed to/from chair/wheelchair/BSC transfer type:: Step pivot Squat pivot transfers: Contact guard assist Step pivot transfers: Min assist, +2 safety/equipment  Lateral/Scoot Transfers: Contact guard assist Transfer via Lift Equipment: Stedy General transfer comment: STS x 6 during session,good recall on hand placement.  Small steps to chair with min A of 2 for safety     Ambulation / Gait / Stairs / Wheelchair Mobility   Ambulation/Gait Ambulation/Gait assistance: Min assist, +2 safety/equipment Gait Distance (Feet): 30 Feet  (30'x2) Assistive device: Rolling walker (2 wheels) Gait Pattern/deviations: Step-to pattern, Decreased stride length, Decreased weight shift to left General Gait Details: Pt taking small steps but no buckling today.  Had chair follow for safety.  Needed cues for rest breaks.  Needing min A for stability Gait velocity: decreased Wheelchair Mobility Wheelchair mobility: Yes Wheelchair propulsion: Both upper extremities, Right lower extremity Wheelchair parts: Needs assistance Distance: 200 Wheelchair Assistance Details (indicate cue type and reason): Unable to actively assist with L LE, cues to initiate brakes prior to transferring from w/c and benefits of frequent rest breaks     Posture / Balance Dynamic Sitting Balance Sitting balance - Comments: Pt tolerated 5+ minutes sitting EOB Balance Overall balance assessment: Needs assistance Sitting-balance support: No upper extremity supported Sitting balance-Leahy Scale: Good Sitting balance - Comments: Pt tolerated 5+ minutes sitting EOB Standing balance support: Bilateral upper extremity supported, Reliant on assistive device for balance Standing balance-Leahy Scale: Poor Standing balance comment: RW and min A     Special needs/care consideration Skin Ecchymosis: arm/bilateral; Erythema/Redness: abdomen, anus, arm, hand, scrotum/bilateral; Incision: puncture-vertebral column/lower, mid and Oxygen: 4L nasal  cannula      Previous Home Environment  Living Arrangements: Spouse/significant other  Lives With: Spouse Available Help at Discharge: Family, Friend(s), Available 24 hours/day Type of Home: House Home Layout: One level Home Access: Ramped entrance Entrance Stairs-Rails: Left Entrance Stairs-Number of Steps: 4 Bathroom Shower/Tub: Health visitor: Handicapped height Bathroom Accessibility: Yes How Accessible: Accessible via wheelchair, Accessible via walker Home Care Services: No Additional Comments: works from  home and wife able to work from home   Discharge Living Setting Plans for Discharge Living Setting: Patient's home Type of Home at Discharge: House Discharge Home Layout: One level Discharge Home Access: Ramped entrance Discharge Bathroom Shower/Tub: Walk-in shower Discharge Bathroom Toilet: Handicapped height Discharge Bathroom Accessibility: Yes How Accessible: Accessible via wheelchair, Accessible via walker Does the patient have any problems obtaining your medications?: No   Social/Family/Support Systems Anticipated Caregiver: Taheem Fricke (wife) and family/friends Anticipated Caregiver's Contact Information: [phone] Caregiver Availability: 24/7 Discharge Plan Discussed with Primary Caregiver: Yes Is Caregiver In Agreement with Plan?: Yes Does Caregiver/Family have Issues with Lodging/Transportation while Pt is in Rehab?: No   Goals Patient/Family Goal for Rehab: Mod I: PT/OT Expected length of stay: 7-10 days Pt/Family Agrees to Admission and willing to participate: Yes Program Orientation Provided & Reviewed with Pt/Caregiver Including Roles  & Responsibilities: Yes   Decrease burden of Care through IP rehab admission: NA   Possible need for SNF placement upon discharge:Not anticipated   Patient Condition: This patient's condition remains as documented in the consult dated 07/16/23, in which the Rehabilitation Physician determined and documented that the patient's condition is appropriate for intensive rehabilitative care in an inpatient rehabilitation facility. Will admit to inpatient rehab today.   Preadmission Screen Completed By:  Amiel Kalata, CCC-SLP, 07/20/2023 9:48 AM ______________________________________________________________________   Discussed status with Dr. Alessandra Ancona on6/4/25 at 0900 and received approval for admission today.   Admission Coordinator:  Amiel Kalata, time 9:48 am/Date 07/20/23           Cosigned by: Liam Redhead,  MD at 07/20/2023 10:11 AM   Revision History

## 2023-07-20 NOTE — Plan of Care (Signed)

## 2023-07-20 NOTE — Progress Notes (Signed)
 Patient ID: John [name], male   DOB: 1960-08-07, 63 y.o.   MRN: [id number]  INPATIENT REHABILITATION ADMISSION NOTE   Arrival Method: [facility]     Mental Orientation: x4   Assessment: see flowsheet   Skin: CDI   IV'S: n/a   Pain: none reported   Tubes and Drains: n/a   Safety Measures: in place   Vital Signs: see flowsheet   Height and Weight: see flowsheet   Rehab Orientation: completed   Family: notified by patient

## 2023-07-20 NOTE — TOC Transition Note (Signed)
 Transition of Care [facility]) - Discharge Note  Patient Details  Name: John [name] MRN: [id number] Date of Birth: 29-Jul-1960  Transition of [facility]) CM/SW Contact:  Zenon Hilda, LCSW Phone Number: 07/20/2023, 10:02 AM  Clinical Narrative: Patient has a bed today at [facility]. TOC signing off.  Final next level of care: IP Rehab Facility Barriers to Discharge: Barriers Resolved  Patient Goals and CMS Choice Patient states their goals for this hospitalization and ongoing recovery are:: Go to rehab or return home CMS Medicare.gov Compare Post Acute Care list provided to:: Patient Choice offered to / list presented to : Patient  Discharge Plan and Services Additional resources added to the After Visit Summary for   In-house Referral: Clinical Social Work Post Acute Care Choice: Skilled Nursing Facility          DME Arranged: N/A DME Agency: NA Date DME Agency Contacted: 07/14/23 Time DME Agency Contacted: 1135 Representative spoke with at DME Agency: Zack HH Arranged: PT, OT HH Agency: [facility] Date [facility] Agency Contacted: 07/14/23 Time HH Agency Contacted: 1148 Representative spoke with at [facility] Agency: Louanna Rouse  Social Drivers of Health (SDOH) Interventions SDOH Screenings   Food Insecurity: No Food Insecurity (07/02/2023)  Housing: Low Risk  (07/02/2023)  Transportation Needs: No Transportation Needs (07/02/2023)  Utilities: Not At Risk (07/02/2023)  Depression (PHQ2-9): Low Risk  (04/12/2023)  Social Connections: Moderately Integrated (03/16/2022)  Tobacco Use: Medium Risk (07/03/2023)   Readmission Risk Interventions    07/03/2023   10:12 AM 06/07/2023    1:47 PM 08/06/2021    2:26 PM  Readmission Risk Prevention Plan  Transportation Screening Complete Complete Complete  PCP or Specialist Appt within 3-5 Days  Complete   HRI or Home Care Consult  Complete   Social Work Consult for Recovery Care Planning/Counseling  Complete   Palliative Care Screening  Not Applicable    Medication Review Oceanographer) Complete Complete   PCP or Specialist appointment within 3-5 days of discharge Complete    HRI or Home Care Consult Not Complete  Complete  HRI or Home Care Consult Pt Refusal Comments N/A    SW Recovery Care/Counseling Consult Complete  Complete  Palliative Care Screening Complete    Skilled Nursing Facility Not Applicable  Not Applicable

## 2023-07-20 NOTE — Discharge Summary (Signed)
 Physician Discharge Summary  John [name] ZOX:[id number] DOB: 1960/02/24 DOA: 07/02/2023  PCP: Patient, No Pcp Per  Admit date: 07/02/2023 Discharge date: 07/20/2023 Recommendations for Outpatient Follow-up:  Follow up with PCP in 1 weeks-call for appointment Please obtain BMP/CBC in one week  Discharge Dispo: CIR Discharge Condition: Stable Code Status:   Code Status: Full Code Diet recommendation:  Diet Order             Diet - low sodium heart healthy           Diet regular Room service appropriate? Yes; Fluid consistency: Thin  Diet effective now                    Brief/Interim Summary:  63 y.o. 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 He was walking without assistive device before this hospitalization.A few days before the hospitalization, he noticed left leg weakness and pain which has progressed since.    He was admitted for intractable back pain, found to have an acute compression fracture of L2.  Now s/p kyphoplasty.   After the procedure, he had left lower extremity pain and weakness.  This has improved with steroids.    Now awaiting CIR, insurance authorization started.   Patient has been accepted at [facility] today  Procedures: 07-08-2023 lumbar L2 kyphoplasty  Consultants: Palliative care Heme/onc IR   Subjective: Seen and examined+ Resting well, lt leg still weak cannot lift it or walk on it x 4 wks. Overnight afebrile BP stable-soft Last labs reviewed from 5/31: Repeat labs ordered for today, CBC stable BMP pending  Discharge diagnosis   Intractable Back Pain Left  LE Pain and weakness Compression Fracture of L2  Chronic compression Deformities (T12, L1, L3, L4, and L5 Vertebral Bodies): MRI T/L spine showed acute compression fracture and other findings- see report S/p L2 kyphoplasty on 5/23, and also treated with pain medication, steroids> with improving symptoms question if this is radicular pain. Neurosurgery was discussed> no recommendation for surgical intervention, Continue pain control, on scheduled fentanyl  patch, steroid taper, and as needed Tylenol /oral and IV narcotics, muscle relaxants, lidocaine  patch. Continue PT OT PT/OT> CIR monitoring hoping to have CIR bed soon After steroid taper is done to resume home dose of prednisone .   Acute on Chronic Hypoxic Respiratory Failure: Uses 3 L at baseline> wean to home setting. Cont IS, ptot.  Of note initially needed up to 15 L HFNC. CT PE>without PE, multifocal areas of GGO throughout both lungs along with peripheral reticular opacities (infectious/inflammatory). Stable focal airspace opacity in R perihilar region and sperior segment of the RLL with central cavitation.   S/p Cefepime  5/17-5/21 and negative covid, flu, RSV.  Negative urine strep, negative urine legionella, negative MRSA PCR.   Stage IV Squamous Cell Carcinoma of R Lung Following with Dr. Maria Shiner on gemzar  and navelbine    Atrial Fibrillation: Rate controlled continue metoprolol , oral amiodarone , anticoagulation with Eliquis .     SIADH Stable on salt tabs and fluid restriction   Body mass index is 32.64 kg/m. Class I obesity weight loss advised   Discharge Exam: Vitals:   07/20/23 0432 07/20/23 0854  BP:  104/81 104/83  Pulse: 78 89  Resp: 16 20  Temp: 98.2 F (36.8 C) 97.8 F (36.6 C)  SpO2: 95% 91%   General: Pt is alert, awake, not in acute distress Cardiovascular: RRR, S1/S2 +, no rubs, no gallops Respiratory: CTA bilaterally, no wheezing, no rhonchi Abdominal: Soft, NT, ND, bowel sounds + Extremities: no edema,  no cyanosis  Discharge Instructions  Discharge Instructions     Amb Referral to Palliative Care   Complete by: As directed    Diet - low sodium heart healthy   Complete by: As directed    Discharge instructions   Complete by: As directed    Please call call MD or return to ER for similar or worsening recurring problem that brought you to hospital or if any fever,nausea/vomiting,abdominal pain, uncontrolled pain, chest pain,  shortness of breath or any other alarming symptoms.  Please follow-up your doctor as instructed in a week time and call the office for appointment.  Please avoid alcohol, smoking, or any other illicit substance and maintain healthy habits including taking your regular medications as prescribed.  You were cared for by a hospitalist during your hospital stay. If you have any questions about your discharge medications or the care you received while you were in the hospital after you are discharged, you can call the unit and ask to speak with the hospitalist on call if the hospitalist that took care of you is not available.  Once you are discharged, your primary care physician will handle any further medical issues. Please note that NO REFILLS for any discharge medications will be authorized once you are discharged, as it is imperative that you return to your primary care physician (or establish a relationship with a primary care physician if you do not have one) for your aftercare needs so that they can reassess your need for medications and monitor your lab values   Discharge wound care:   Complete by: As directed    Continue discharge wound care as ordered   Increase activity slowly   Complete by: As directed       Allergies as of 07/20/2023       Reactions   Ventolin  [albuterol ] Other (See Comments)   Patient has had episode of atrial fib following administration of albuterol  (tolerates Xopenex  well)   Lipitor [atorvastatin ] Other (See Comments)   Arthralgias     Oxycodone  Other (See Comments)   Severe Migranes   Ketamine  Anxiety   "Feeling of doom"        Medication List     PAUSE taking these medications    predniSONE  5 MG tablet Wait to take this until: July 24, 2023 Commonly known as: DELTASONE  Take 1 tablet (5 mg total) by mouth daily with breakfast. You also have another medication with the same name that you may need to continue taking.       STOP taking these medications    oxyCODONE -acetaminophen  5-325 MG tablet Commonly known as: PERCOCET/ROXICET       TAKE these medications    acetaminophen  325 MG tablet Commonly known as: TYLENOL  Take 2 tablets (650 mg total) by mouth every 6 (six) hours as needed for mild pain (pain score 1-3), fever or headache. What changed:  medication strength how much to take when to take this reasons to take this   amiodarone  200 MG tablet Commonly known as: PACERONE  Take 1 tablet (200 mg total) by mouth daily. Start taking on: July 21, 2023   benzonatate  200  MG capsule Commonly known as: TESSALON  Take 1 capsule (200 mg total) by mouth 3 (three) times daily as needed.   bisacodyl  10 MG suppository Commonly known as: DULCOLAX Place 1 suppository (10 mg total) rectally daily as needed for moderate constipation.   Eliquis  5 MG Tabs tablet Generic drug: apixaban  TAKE 1 TABLET(5 MG) BY MOUTH TWICE DAILY What changed: See the new instructions.   feeding supplement Liqd Take 237 mLs by mouth 3 (three) times daily between meals.   fentaNYL  25 MCG/HR Commonly known as: DURAGESIC  Place 1 patch onto the skin every 3 (three) days. Start taking on: July 21, 2023   HYDROcodone  bit-homatropine 5-1.5 MG/5ML syrup Commonly known as: Hydromet Take 5 mLs by mouth every 6 (six) hours as needed for cough.   HYDROmorphone  2 MG tablet Commonly known as: DILAUDID  Take 1.5 tablets (3 mg total) by mouth every 4 (four) hours as needed for moderate pain (pain score 4-6) or severe pain (pain score  7-10).   lidocaine  5 % Commonly known as: LIDODERM  Place 1 patch onto the skin daily. Remove & Discard patch within 12 hours or as directed by MD   metoprolol  succinate 25 MG 24 hr tablet Commonly known as: TOPROL -XL TAKE 1 TABLET(25 MG) BY MOUTH DAILY   ondansetron  8 MG tablet Commonly known as: ZOFRAN  Take 1 tablet (8 mg total) by mouth every 8 (eight) hours as needed for nausea or vomiting.   pantoprazole  40 MG tablet Commonly known as: PROTONIX  Take 1 tablet (40 mg total) by mouth daily. Start taking on: July 21, 2023   PARoxetine  10 MG tablet Commonly known as: PAXIL  Take 1 tablet (10 mg total) by mouth at bedtime.   polyethylene glycol 17 g packet Commonly known as: MIRALAX  / GLYCOLAX  Take 17 g by mouth daily as needed for mild constipation.   predniSONE  5 MG tablet Commonly known as: DELTASONE  Take 4 tablets (20 mg total) by mouth daily with breakfast for 2 days, THEN 2 tablets (10 mg total) daily with breakfast for 2 days. Start taking on: July 21, 2023 What changed: Another medication with the same name was paused. Ask your nurse or doctor if you should take this medication.   prochlorperazine  10 MG tablet Commonly known as: COMPAZINE  Take 1 tablet (10 mg total) by mouth every 6 (six) hours as needed for nausea or vomiting.   Repatha  SureClick 140 MG/ML Soaj Generic drug: Evolocumab  ADMINISTER 1 ML UNDER THE SKIN EVERY 14 DAYS   senna-docusate 8.6-50 MG tablet Commonly known as: Senokot-S Take 2 tablets by mouth 2 (two) times daily.   sodium chloride  1 g tablet Take 1 tablet (1 g total) by mouth 2 (two) times daily with a meal.   Trelegy Ellipta  100-62.5-25 MCG/ACT Aepb Generic drug: Fluticasone -Umeclidin-Vilant INHALE 1 PUFF INTO THE LUNGS DAILY   Vitamin D  (Ergocalciferol ) 1.25 MG (50000 UNIT) Caps capsule Commonly known as: DRISDOL  Take 1 capsule (50,000 Units total) by mouth every 7 (seven) days.               Durable Medical Equipment   (From admission, onward)           Start     Ordered   07/14/23 1441  For home use only DME standard manual wheelchair with seat cushion  Once       Comments: Patient suffers from compression fractures and left leg weakness which impairs their ability to perform daily activities like bathing, dressing, feeding, grooming, and toileting in the home.  A cane, crutch, or walker will not resolve issue with performing activities of daily living. A wheelchair will allow patient to safely perform daily activities. Patient can safely propel the wheelchair in the home or has a caregiver who can provide assistance. Length of need Lifetime. Accessories: elevating leg rests (ELRs), wheel locks, extensions and anti-tippers.   18 inch chair, elevated leg rest, swing back arm rests, 18 inch cushion   07/14/23 1441   07/14/23 1419  For home use only DME Hospital bed  Once       Question Answer Comment  Length of Need Lifetime   The above medical condition requires: Patient requires the ability to reposition frequently   Bed type Semi-electric      07/14/23 1419              Discharge Care Instructions  (From admission, onward)           Start     Ordered   07/20/23 0000  Discharge wound care:       Comments: Continue discharge wound care as ordered   07/20/23 0950            Allergies  Allergen Reactions   Ventolin  [Albuterol ] Other (See Comments)    Patient has had episode of atrial fib following administration of albuterol  (tolerates Xopenex  well)   Lipitor [Atorvastatin ] Other (See Comments)    Arthralgias    Oxycodone  Other (See Comments)    Severe Migranes   Ketamine  Anxiety    "Feeling of doom"    The results of significant diagnostics from this hospitalization (including imaging, microbiology, ancillary and laboratory) are listed below for reference.    Microbiology: No results found for this or any previous visit (from the past 240 hours).   Procedures/Studies: IR KYPHO LUMBAR INC FX REDUCE BONE BX UNI/BIL CANNULATION INC/IMAGING Result Date: 07/08/2023 INDICATION: 63 year old male presents for treatment of L2 compression fracture EXAM: IR KYPHO VERTEBRAL LUMBAR AUGMENTATION COMPARISON:  None Available. MEDICATIONS: As antibiotic prophylaxis, 2 g Ancef  was ordered pre-procedure and administered intravenously within 1 hour of incision. ANESTHESIA/SEDATION: Moderate (conscious) sedation was employed during this procedure. A total of Versed  3.0 mg and Fentanyl  150 mcg, 1 mg IV Dilaudid  was administered intravenously. Moderate Sedation Time: 22 minutes. The patient's level of consciousness and vital signs were monitored continuously by radiology nursing throughout the procedure under my direct supervision. FLUOROSCOPY TIME:  Reference air kerma: 717 mGy COMPLICATIONS: None PROCEDURE: Following a full explanation of the procedure along with the potentially associated complications, a witnessed informed consent was obtained. Specific risks that were discussed included bleeding, infection, injury to adjacent structures, neurologic injury, embolization of cement within the veins, failure of the procedure to improve pain, need for further procedure/ surgery, cardiopulmonary collapse, death. The patient understands the risks and wishes to proceed. The patient was placed prone on the fluoroscopic table. Nasal oxygen was administered. Physiologic monitoring was performed throughout the duration of the procedure. The skin overlying the L2 region was prepped and draped in the usual sterile fashion. The vertebral body was identified and the right pedicle was infiltrated with 1% lidocaine . This was then followed by the advancement of an 8-gauge Medtronic needle through the right pedicle into the mid to posterior 1/3. The left pedicle was then infiltrated with 1% lidocaine . A small incision was made with 11 blade scalpel, and then a Medtronic 8 gauge needle was  advanced through the left pedicle into the posterior 1/3 of the vertebral body. Simultaneous inflation of  the left and right pedicular cannula balloons was performed under fluoroscopic observation. Methylmethacrylate mixture was then reconstituted. Under biplane intermittent fluoroscopy, the methylmethacrylate was then injected into the vertebral body with filling of the fracture cleft, and excellent radiographic left-to-right filling. No extravasation was noted posteriorly into the spinal canal. Trace right-sided venous contamination was seen. The needles were then removed. Hemostasis was achieved at the skin entry site. There were no acute complications. Patient tolerated the procedure well. Patient was then observed in recovery before transfer. IMPRESSION: Status post image guided vertebral augmentation of L2 vertebral body, via bipedicular approach using kyphoplasty technique. Signed, Marciano Settles. Rexine Cater, RPVI [facility] Electronically Signed   By: Myrlene Asper D.O.   On: 07/08/2023 16:32   MR LUMBAR SPINE W WO CONTRAST Result Date: 07/05/2023 CLINICAL DATA:  Initial evaluation for acute back pain, metastatic disease evaluation. EXAM: MRI LUMBAR SPINE WITHOUT AND WITH CONTRAST TECHNIQUE: Multiplanar and multiecho pulse sequences of the lumbar spine were obtained without and with intravenous contrast. CONTRAST:  10mL GADAVIST  GADOBUTROL  1 MMOL/ML IV SOLN COMPARISON:  Prior study from 06/06/2023 FINDINGS: Segmentation: Standard. Lowest well-formed disc space labeled the L5-S1 level. Alignment: Physiologic with preservation of the normal lumbar lordosis. No listhesis. Vertebrae: Acute compression fracture extending through the inferior endplate of L2 with associated marrow edema and enhancement, new from prior. This is superimposed on underlying chronic L2 compression fracture. Associated height loss measures up to 50-60% without significant bony  retropulsion this is benign/mechanical in appearance. Additional chronic compression deformities involving the T12, L1, L3, L4, and L5 vertebral bodies again seen, otherwise stable. Sequelae of prior vertebral augmentation at T12, L1, and L3. Underlying bone marrow signal intensity within normal limits. No worrisome osseous lesions or evidence for metastatic disease. No other abnormal enhancement. Conus medullaris and cauda equina: Conus extends to the T12 level. Conus and cauda equina appear normal. Paraspinal and other soft tissues: Paraspinous soft tissues demonstrate no acute finding. 1.1 cm exophytic left renal cyst noted, benign in appearance, no follow-up imaging recommended. 3.7 cm intra-abdominal aortic aneurysm noted. Disc levels: L1-2: Disc desiccation with diffuse disc bulge. Mild bilateral facet spurring. No significant spinal stenosis. Foramina remain patent. L2-3: Diffuse disc bulge with disc desiccation. Mild bilateral facet hypertrophy. Mild prominence of the dorsal epidural fat. Resultant mild spinal stenosis with mild bilateral foraminal narrowing. L3-4: Disc desiccation with mild diffuse disc bulge. Superimposed left foraminal to extraforaminal disc protrusion contacts the exiting left L3 nerve root. Mild facet and ligament flavum hypertrophy. No more than mild spinal stenosis. Mild bilateral L3 foraminal narrowing. L4-5: Disc desiccation with mild disc bulge. Superimposed broad base left subarticular to foraminal disc protrusion (series 5, image 26). Mild facet and ligament flavum hypertrophy. Resultant moderate canal with left greater than right lateral recess stenosis. Mild to moderate bilateral L4 foraminal narrowing. L5-S1: Disc desiccation with mild disc bulge. Small posterior annular fissure. Moderate left with mild right facet hypertrophy. No significant spinal stenosis. Foramina remain adequately patent. IMPRESSION: 1. Acute compression fracture involving the inferior endplate of L2 with  up to 60% height loss without significant bony retropulsion. This is superimposed on an underlying chronic L2 compression fracture, and is benign/mechanical in appearance. No evidence for metastatic disease within the lumbar spine. 2. Additional chronic compression deformities involving the T12, L1, L3, L4, and L5 vertebral bodies, otherwise stable. 3. Multifactorial degenerative changes at L4-5 with resultant moderate canal and left greater than right lateral recess stenosis, with mild  to moderate bilateral L4 foraminal narrowing. 4. Left foraminal to extraforaminal disc protrusion at L3-4, potentially affecting the exiting left L3 nerve root. 5. 3.7 cm intra-abdominal aortic aneurysm. Recommend follow-up every 2 years. This recommendation follows ACR consensus guidelines: White Paper of the ACR Incidental Findings Committee II on Vascular Findings. J Am Coll Radiol 2013; 10:789-794. Electronically Signed   By: Virgia Griffins M.D.   On: 07/05/2023 02:20   MR THORACIC SPINE W WO CONTRAST Result Date: 07/05/2023 CLINICAL DATA:  Initial evaluation for severe back pain, metastatic disease evaluation EXAM: MRI THORACIC WITHOUT AND WITH CONTRAST TECHNIQUE: Multiplanar and multiecho pulse sequences of the thoracic spine were obtained without and with intravenous contrast. CONTRAST:  10mL GADAVIST  GADOBUTROL  1 MMOL/ML IV SOLN COMPARISON:  Prior studies from 07/02/2023 and earlier. FINDINGS: Alignment: Straightening of the normal thoracic kyphosis with underlying mild dextroscoliosis. No listhesis. Vertebrae: Compression deformity involving the superior endplate of T11 with mild residual marrow edema and enhancement, consistent with an evolving subacute fracture. Height loss is not significantly changed or progressed as compared to prior MRI from 06/06/2023. This is benign/mechanical in appearance. Additional chronic compression deformities involving the T12 and L1 vertebral bodies with sequelae of prior vertebral  augmentation, stable. Vertebral body height otherwise maintained with no other acute or interval fracture. Bone marrow signal intensity within normal limits. No worrisome osseous lesions or evidence for metastatic disease. No other abnormal enhancement. Cord:  Normal signal and morphology.  No abnormal enhancement. Paraspinal and other soft tissues: Paraspinous soft tissues demonstrate no acute finding. Post treatment changes with fibrotic changes noted within the partially visualized right lung. Known right adrenal metastasis noted, grossly similar to prior CT. Disc levels: T6-7: Small left paracentral disc protrusion indents the ventral thecal sac. No spinal stenosis. Foramina remain patent. T7-8: Central disc protrusion indents the ventral thecal sac. Mild flattening of the ventral cord without cord signal changes or significant spinal stenosis. Foramina remain patent. T10-11: Mild disc bulge with facet hypertrophy. No canal stenosis. Moderate right with mild left foraminal stenosis. Otherwise, minor for age spondylosis elsewhere within the thoracic spine. No other significant stenosis or impingement. IMPRESSION: 1. No evidence for metastatic disease within the thoracic spine. 2. Evolving subacute compression fracture involving the superior endplate of T11 with no more than mild residual marrow edema. Height loss is not significantly changed or progressed as compared to prior MRI from 06/06/2023. 3. Chronic compression deformities involving the T12 and L1 vertebral bodies with sequelae of prior vertebral augmentation, stable. 4. Small disc protrusions at T6-7 and T7-8 without significant stenosis. Moderate right with mild left foraminal stenosis at T10-11. 5. Post treatment changes within the visualized right lung with known right adrenal metastasis, described on recent CTs. Electronically Signed   By: Virgia Griffins M.D.   On: 07/05/2023 02:05   DG HIP UNILAT WITH PELVIS 2-3 VIEWS LEFT Result Date:  07/04/2023 CLINICAL DATA:  Left hip pain. EXAM: DG HIP (WITH OR WITHOUT PELVIS) 2-3V LEFT COMPARISON:  KUB 07/03/2023, CT chest, abdomen, and pelvis 06/23/2023 FINDINGS: There is diffuse decreased bone mineralization. The bilateral femoroacetabular joint spaces are maintained. Mild bilateral superolateral acetabular degenerative osteophytosis. The bilateral sacroiliac and pubic symphysis joint spaces are maintained. No acute fracture or dislocation. L3 vertebral body and partially visualized L1 vertebral body augmentation cement again noted. Mild to moderate atherosclerotic calcifications. IMPRESSION: Mild bilateral femoroacetabular osteoarthritis. Electronically Signed   By: Bertina Broccoli M.D.   On: 07/04/2023 09:23   DG Abd 1 View Result Date:  07/03/2023 CLINICAL DATA:  Constipation. Patient reports left bowel movement was 5 days ago. EXAM: ABDOMEN - 1 VIEW COMPARISON:  CT 06/23/2023 FINDINGS: Diminished stool burden from prior CT. No significant formed stool throughout the colon. There is scattered air throughout nondilated small bowel in the central abdomen. Cholecystectomy clips in the right upper quadrant. Excreted IV contrast in the urinary bladder from yesterday's chest CTA. Multilevel vertebral augmentation. IMPRESSION: 1. No significant formed stool in the colon. 2. Air scattered throughout nondilated small bowel, query ileus. Electronically Signed   By: Chadwick Colonel M.D.   On: 07/03/2023 14:55   CT Angio Chest PE W and/or Wo Contrast Result Date: 07/02/2023 CLINICAL DATA:  Right-sided chest pain. EXAM: CT ANGIOGRAPHY CHEST WITH CONTRAST TECHNIQUE: Multidetector CT imaging of the chest was performed using the standard protocol during bolus administration of intravenous contrast. Multiplanar CT image reconstructions and MIPs were obtained to evaluate the vascular anatomy. RADIATION DOSE REDUCTION: This exam was performed according to the departmental dose-optimization program which includes  automated exposure control, adjustment of the mA and/or kV according to patient size and/or use of iterative reconstruction technique. CONTRAST:  75mL OMNIPAQUE  IOHEXOL  350 MG/ML SOLN COMPARISON:  CT angiogram chest abdomen and pelvis 06/23/2023. FINDINGS: Cardiovascular: Satisfactory opacification of the pulmonary arteries to the segmental level. No evidence of pulmonary embolism. Normal heart size. No pericardial effusion. Mediastinum/Nodes: No enlarged mediastinal, hilar, or axillary lymph nodes. Thyroid  gland, trachea, and esophagus demonstrate no significant findings. Lungs/Pleura: Mild emphysema again noted in the lung apices. Multifocal areas of ground-glass opacity are again seen throughout both lungs along with some peripheral reticular opacities. Fibrotic changes are again noted in the right lung base. There is some focal airspace opacity in the right perihilar region and superior segment of the right lower lobe with some central cavitation. There is no pleural effusion or pneumothorax. Compared to 06/23/2023 there has been no significant interval change. Upper Abdomen: No acute abnormality. Cholecystectomy clips are present. Infrarenal abdominal aortic aneurysm measures up to 3.3 cm and appears unchanged. Musculoskeletal: No acute osseous findings. Review of the MIP images confirms the above findings. IMPRESSION: 1. No evidence for pulmonary embolism. 2. Stable multifocal areas of ground-glass opacity throughout both lungs along with some peripheral reticular opacities. Findings are favored as infectious/inflammatory. 3. Stable focal airspace opacity in the right perihilar region and superior segment of the right lower lobe with some central cavitation. Findings may be infectious/inflammatory. 4. Stable 3.3 cm infrarenal abdominal aortic aneurysm. Recommend follow-up ultrasound every 3 years. Aortic Atherosclerosis (ICD10-I70.0) and Emphysema (ICD10-J43.9). Electronically Signed   By: Tyron Gallon M.D.    On: 07/02/2023 17:48   CT T-SPINE NO CHARGE Result Date: 07/02/2023 CLINICAL DATA:  Lower back and bilateral lower extremity pain, stage IV lung cancer EXAM: CT Thoracic Spine with contrast TECHNIQUE: Multiplanar CT images of the thoracic spine were reconstructed from contemporary CT of the Chest. RADIATION DOSE REDUCTION: This exam was performed according to the departmental dose-optimization program which includes automated exposure control, adjustment of the mA and/or kV according to patient size and/or use of iterative reconstruction technique. CONTRAST:  No additional COMPARISON:  06/23/2023 FINDINGS: Alignment: Alignment is grossly anatomic. Vertebrae: Stable compression deformities at T10, T11, T12, L1, and L2. Prior vertebral augmentations seen at T12 and L1. There are no acute displaced fractures. No destructive bony abnormalities. Paraspinal and other soft tissues: Paraspinal soft tissues are unremarkable. Continue consolidation cavitation within the right lower lobe. Please see separate CT chest report for findings in  that region. Disc levels: There is no significant bony encroachment upon the central canal or neural foramina. Reconstructed images demonstrate no additional findings. IMPRESSION: 1. Chronic compression deformities from T10 through L2, with evidence of prior vertebral augmentation at T12 and L1. No acute thoracic spine fracture. 2. Please refer to separate CT chest report for findings in that region. Electronically Signed   By: Bobbye Burrow M.D.   On: 07/02/2023 17:47   DG Chest Port 1 View Result Date: 07/02/2023 CLINICAL DATA:  Lower back and bilateral lower extremity pain, stage IV lung cancer EXAM: PORTABLE CHEST 1 VIEW COMPARISON:  06/23/2023 FINDINGS: Single frontal view of the chest demonstrates a stable cardiac silhouette. Fibrotic changes in the right lung are stable, consistent with post treatment appearance in this patient with history of lung cancer. There is progressive  consolidation at the right lung base which may reflect new airspace disease or atelectasis. No effusion or pneumothorax. Left chest is clear. No acute bony abnormalities. IMPRESSION: 1. Increasing right basilar consolidation superimposed upon chronic post radiation fibrosis, which may reflect acute airspace disease or atelectasis. Electronically Signed   By: Bobbye Burrow M.D.   On: 07/02/2023 14:48   CT Angio Chest/Abd/Pel for Dissection W and/or Wo Contrast Result Date: 06/23/2023 CLINICAL DATA:  Weakness, body aches, nausea, lung cancer, chemotherapy today * Tracking Code: BO * EXAM: CT ANGIOGRAPHY CHEST, ABDOMEN AND PELVIS TECHNIQUE: Non-contrast CT of the chest was initially obtained. Multidetector CT imaging through the chest, abdomen and pelvis was performed using the standard protocol during bolus administration of intravenous contrast. Multiplanar reconstructed images and MIPs were obtained and reviewed to evaluate the vascular anatomy. RADIATION DOSE REDUCTION: This exam was performed according to the departmental dose-optimization program which includes automated exposure control, adjustment of the mA and/or kV according to patient size and/or use of iterative reconstruction technique. CONTRAST:  OMNIPAQUE  IOHEXOL  350 MG/ML SOLN COMPARISON:  MR abdomen, 06/07/2023, CT chest abdomen pelvis angiogram, 06/06/2023 FINDINGS: CTA CHEST FINDINGS VASCULAR Aorta: Satisfactory opacification of the aorta. Normal contour and caliber of the thoracic aorta. No evidence of aneurysm, dissection, or other acute aortic pathology. Mild mixed calcific atherosclerosis Cardiovascular: No evidence of pulmonary embolism on limited non-tailored examination. Mild cardiomegaly. Left coronary artery calcifications. No pericardial effusion. Review of the MIP images confirms the above findings. NON VASCULAR Mediastinum/Nodes: No enlarged mediastinal, hilar, or axillary lymph nodes. Thyroid  gland, trachea, and esophagus  demonstrate no significant findings. Lungs/Pleura: Unchanged post treatment appearance of the chest with dense consolidation and fibrosis of the perihilar right lung and right lower lobe, as well as some evidence of cavitary change in the right lower lobe (series 9, image 67). Mild underlying UIP pattern pulmonary fibrosis as well as moderate emphysema. No pleural effusion or pneumothorax. Musculoskeletal: No chest wall abnormality. No acute osseous findings. Review of the MIP images confirms the above findings. CTA ABDOMEN AND PELVIS FINDINGS VASCULAR Unchanged infrarenal abdominal aortic aneurysm measuring 3.3 x 3.1 cm (series 13, image 197). Otherwise normal contour and caliber of the abdominal aorta. Moderate mixed calcific atherosclerosis. No evidence of acute aortic pathology. Standard branching pattern of the abdominal aorta with solitary bilateral renal arteries. Review of the MIP images confirms the above findings. NON-VASCULAR Hepatobiliary: No focal liver abnormality is seen. Status post interval cholecystectomy with minimal residual fluid in the gallbladder fossa (series 13, image 115). No biliary dilatation. Pancreas: Diffuse fatty atrophy of the pancreatic parenchyma. No pancreatic ductal dilatation or surrounding inflammatory changes. Spleen: Mild splenomegaly, maximum coronal  span 13.1 cm. Adrenals/Urinary Tract: Unchanged right adrenal mass (series 13, image 126). Kidneys are normal, without renal calculi, solid lesion, or hydronephrosis. Bladder is unremarkable. Stomach/Bowel: Stomach is within normal limits. Appendix appears normal. No evidence of bowel wall thickening, distention, or inflammatory changes. Lymphatic: No enlarged abdominal or pelvic lymph nodes. Reproductive: No mass or other significant abnormality. Other: Small fat containing left inguinal hernia.  No ascites. Musculoskeletal: No acute osseous findings. IMPRESSION: 1. No evidence of thoracic aortic aneurysm, dissection, or other  acute aortic pathology. 2. Unchanged infrarenal abdominal aortic aneurysm measuring 3.3 x 3.1 cm. No acute pathology. Moderate aortic atherosclerosis. 3. Status post interval cholecystectomy with minimal residual postoperative fluid in the gallbladder fossa. No biliary ductal dilatation. 4. Unchanged post treatment appearance of the chest with dense consolidation and fibrosis of the perihilar right lung and right lower lobe, as well as some evidence of cavitary change in the right lower lobe. Mild underlying UIP pattern pulmonary fibrosis as well as moderate emphysema. 5. Unchanged right adrenal metastasis. 6. Coronary artery disease. Aortic Atherosclerosis (ICD10-I70.0) and Emphysema (ICD10-J43.9). Electronically Signed   By: Fredricka Jenny M.D.   On: 06/23/2023 20:28   DG Chest Port 1 View Result Date: 06/23/2023 CLINICAL DATA:  Chest pain. EXAM: PORTABLE CHEST 1 VIEW COMPARISON:  06/06/2023 CT and plain film. FINDINGS: Volume loss on the right. Increasing airspace disease in the right mid and lower lung. Fibrotic changes noted in the lung bases, similar to prior study. Heart and mediastinal contours within normal limits. No visible effusions or pneumothorax. IMPRESSION: Fibrotic changes in the lung bases. Worsening airspace disease in the right mid and lower lung. Electronically Signed   By: Janeece Mechanic M.D.   On: 06/23/2023 19:43    Labs: BNP (last 3 results) No results for input(s): "BNP" in the last 8760 hours. Basic Metabolic Panel: Recent Labs  Lab 07/14/23 0353 07/15/23 0341 07/16/23 0426  NA 132* 134* 133*  K 3.7 3.6 3.4*  CL 99 103 101  CO2 25 25 26   GLUCOSE 108* 89 98  BUN 16 16 19   CREATININE 0.63 0.76 0.80  CALCIUM  8.5* 8.3* 8.3*  MG 2.1 2.1 2.2  PHOS 3.3 3.3 3.3   Liver Function Tests: Recent Labs  Lab 07/13/23 2047 07/14/23 0353 07/15/23 0341 07/16/23 0426  AST  --  27 24 25   ALT  --  35 34 35  ALKPHOS  --  78 73 73  BILITOT  --  1.0 0.9 0.9  PROT  --  5.8* 5.9*  5.9*  ALBUMIN  3.2* 3.3* 3.2* 3.3*   No results for input(s): "LIPASE", "AMYLASE" in the last 168 hours. No results for input(s): "AMMONIA" in the last 168 hours. CBC: Recent Labs  Lab 07/14/23 0353 07/15/23 0341 07/16/23 0426 07/20/23 0821  WBC 7.5 7.1 7.4 8.2  NEUTROABS 6.1 5.8 6.1 6.3  HGB 10.6* 11.0* 10.7* 13.4  HCT 33.0* 34.9* 33.3* 42.1  MCV 96.8 97.8 95.7 98.1  PLT 248 248 228 241   No results for input(s): "VITAMINB12", "FOLATE", "FERRITIN", "TIBC", "IRON", "RETICCTPCT" in the last 72 hours. Urinalysis    Component Value Date/Time   COLORURINE AMBER (A) 06/06/2023 1316   APPEARANCEUR CLEAR 06/06/2023 1316   LABSPEC 1.027 06/06/2023 1316   PHURINE 6.0 06/06/2023 1316   GLUCOSEU NEGATIVE 06/06/2023 1316   HGBUR NEGATIVE 06/06/2023 1316   BILIRUBINUR NEGATIVE 06/06/2023 1316   KETONESUR NEGATIVE 06/06/2023 1316   PROTEINUR NEGATIVE 06/06/2023 1316   NITRITE NEGATIVE 06/06/2023 1316  LEUKOCYTESUR NEGATIVE 06/06/2023 1316   Sepsis Labs Recent Labs  Lab 07/14/23 0353 07/15/23 0341 07/16/23 0426 07/20/23 0821  WBC 7.5 7.1 7.4 8.2   Microbiology No results found for this or any previous visit (from the past 240 hours).  Time coordinating discharge: 35 minutes  SIGNED: Lesa Rape, MD  [facility] 07/20/2023, 9:51 AM  If 7PM-7AM, please contact night-coverage [email or web address]

## 2023-07-20 NOTE — Discharge Instructions (Addendum)
 Inpatient Rehab Discharge Instructions  John [name] Discharge date and time: No discharge date for patient encounter.   Activities/Precautions/ Functional Status: Activity: activity as tolerated Diet: regular diet Wound Care: Routine skin checks Functional status:  ___ No restrictions     ___ Walk up steps independently ___ 24/7 supervision/assistance   ___ Walk up steps with assistance ___ Intermittent supervision/assistance  ___ Bathe/dress independently ___ Walk with walker     _x__ Bathe/dress with assistance ___ Walk Independently    ___ Shower independently ___ Walk with assistance    ___ Shower with assistance ___ No alcohol     ___ Return to work/school ________  Special Instructions: No driving smoking or alcohol   COMMUNITY REFERRALS UPON DISCHARGE:    Home Health:   PT    &      OT                Agency:[facility] [phone]    Medical Equipment/Items Ordered:WHEELCHAIR                                                 Agency/Supplier:[facility]   [phone]     My questions have been answered and I understand these instructions. I will adhere to these goals and the provided educational materials after my discharge from the hospital.  Patient/Caregiver Signature _______________________________ Date __________  Clinician Signature _______________________________________ Date __________  Please bring this form and your medication list with you to all your follow-up doctor's appointments.     Information on my medicine - ELIQUIS  (apixaban )  This medication education was reviewed with me or my healthcare representative as part of my discharge preparation. You were taking this medication prior to this hospital admission.    Why was Eliquis  prescribed for you? Eliquis  was prescribed for you to reduce the risk of a blood clot forming that can cause a stroke if you have a medical condition called atrial fibrillation (a type of irregular  heartbeat).  What do You need to know about Eliquis  ? Take your Eliquis  TWICE DAILY - one tablet in the morning and one tablet in the evening with or without food. If you have difficulty swallowing the tablet whole please discuss with your pharmacist how to take the medication safely.  Take Eliquis  exactly as prescribed by your doctor and DO NOT stop taking Eliquis  without talking to the doctor who prescribed the medication.  Stopping may increase your risk of developing a stroke.  Refill your prescription before you run out.  After discharge, you should have regular check-up appointments with your healthcare provider that is prescribing your Eliquis .  In the future your dose may need to be changed if your kidney function or weight changes by a significant amount or as you get older.  What do you do if you miss a dose? If you miss a dose, take it as soon as you remember on the same day and resume taking twice daily.  Do not take more than one dose of ELIQUIS  at the same time to make up a missed dose.  Important Safety Information A possible side effect of Eliquis  is bleeding. You should call your healthcare provider right away if you experience any of the following: Bleeding from an injury or your nose that does not stop. Unusual colored urine (red or dark brown) or unusual colored stools (  red or black). Unusual bruising for unknown reasons. A serious fall or if you hit your head (even if there is no bleeding).  Some medicines may interact with Eliquis  and might increase your risk of bleeding or clotting while on Eliquis . To help avoid this, consult your healthcare provider or pharmacist prior to using any new prescription or non-prescription medications, including herbals, vitamins, non-steroidal anti-inflammatory drugs (NSAIDs) and supplements.  This website has more information on Eliquis  (apixaban ): [email or web address] John [name]

## 2023-07-21 DIAGNOSIS — M5416 Radiculopathy, lumbar region: Secondary | ICD-10-CM | POA: Diagnosis not present

## 2023-07-21 LAB — CBC WITH DIFFERENTIAL/PLATELET
Abs Immature Granulocytes: 0.03 10*3/uL (ref 0.00–0.07)
Basophils Absolute: 0 10*3/uL (ref 0.0–0.1)
Basophils Relative: 0 %
Eosinophils Absolute: 0.1 10*3/uL (ref 0.0–0.5)
Eosinophils Relative: 2 %
HCT: 34.7 % — ABNORMAL LOW (ref 39.0–52.0)
Hemoglobin: 11.7 g/dL — ABNORMAL LOW (ref 13.0–17.0)
Immature Granulocytes: 1 %
Lymphocytes Relative: 12 %
Lymphs Abs: 0.6 10*3/uL — ABNORMAL LOW (ref 0.7–4.0)
MCH: 31.5 pg (ref 26.0–34.0)
MCHC: 33.7 g/dL (ref 30.0–36.0)
MCV: 93.3 fL (ref 80.0–100.0)
Monocytes Absolute: 0.6 10*3/uL (ref 0.1–1.0)
Monocytes Relative: 12 %
Neutro Abs: 3.9 10*3/uL (ref 1.7–7.7)
Neutrophils Relative %: 73 %
Platelets: 172 10*3/uL (ref 150–400)
RBC: 3.72 MIL/uL — ABNORMAL LOW (ref 4.22–5.81)
RDW: 18.5 % — ABNORMAL HIGH (ref 11.5–15.5)
WBC: 5.3 10*3/uL (ref 4.0–10.5)
nRBC: 0 % (ref 0.0–0.2)

## 2023-07-21 LAB — COMPREHENSIVE METABOLIC PANEL WITH GFR
ALT: 32 U/L (ref 0–44)
AST: 21 U/L (ref 15–41)
Albumin: 3 g/dL — ABNORMAL LOW (ref 3.5–5.0)
Alkaline Phosphatase: 57 U/L (ref 38–126)
Anion gap: 7 (ref 5–15)
BUN: 15 mg/dL (ref 8–23)
CO2: 27 mmol/L (ref 22–32)
Calcium: 8.5 mg/dL — ABNORMAL LOW (ref 8.9–10.3)
Chloride: 103 mmol/L (ref 98–111)
Creatinine, Ser: 1.05 mg/dL (ref 0.61–1.24)
GFR, Estimated: >60 mL/min (ref >60)
Glucose, Bld: 95 mg/dL (ref 70–99)
Potassium: 3.4 mmol/L — ABNORMAL LOW (ref 3.5–5.1)
Sodium: 137 mmol/L (ref 135–145)
Total Bilirubin: 0.8 mg/dL (ref 0.0–1.2)
Total Protein: 5.3 g/dL — ABNORMAL LOW (ref 6.5–8.1)

## 2023-07-21 MED ORDER — POTASSIUM CHLORIDE CRYS ER 20 MEQ PO TBCR
40.0000 meq | EXTENDED_RELEASE_TABLET | Freq: Once | ORAL | Status: AC
  Administered 2023-07-21: 40 meq via ORAL
  Filled 2023-07-21: qty 2

## 2023-07-21 NOTE — Progress Notes (Signed)
 Inpatient Rehabilitation Care Coordinator Assessment and Plan Patient Details  Name: John [name] MRN: [id number] Date of Birth: 30-Jun-1960  Today's Date: 07/21/2023  Hospital Problems: Principal Problem:   Lumbar radiculopathy  Past Medical History:  Past Medical History:  Diagnosis Date   Arthritis    Rheumatoid   Bacteremia due to Pseudomonas    C. difficile colitis    Calculus of GB and bile duct w ac and chr cholecyst w obst 06/08/2023   Chronic anxiety 11/08/2014   Cough 09/22/2021   COVID 2020   mild case   Depression    Drug-induced neutropenia (HCC) 08/11/2020   Family history of adverse reaction to anesthesia    brother with seizures had episode under anesthesia.  Patient has seizures as well.   GERD (gastroesophageal reflux disease)    History of radiation therapy 04/24/2020-05/16/2020   IMRT to right lung     Dr Retta Caster   History of radiation therapy    08/10/21-08/19/21-Dr. Retta Caster   History of radiation therapy    Abdomen- 03/21/23-03/31/23-Dr. Retta Caster   Neutropenia [facility]) 05/12/2020   Neutropenic fever (HCC) 05/12/2020   Normocytic anemia 04/18/2021   PAF (paroxysmal atrial fibrillation) (HCC)    CHADS2VSAC score 3   Rheumatoid aortitis    Secondary hypercoagulable state (HCC) 10/28/2020   Sepsis (HCC) 10/06/2020   Shortness of breath 12/18/2020   Squamous cell lung cancer [facility])    Past Surgical History:  Past Surgical History:  Procedure Laterality Date   BRONCHIAL BRUSHINGS  03/27/2020   Procedure: BRONCHIAL BRUSHINGS;  Surgeon: Denson Flake, MD;  Location: [facility] ENDOSCOPY;  Service: Cardiopulmonary;;   BUBBLE STUDY  10/13/2020   Procedure: BUBBLE STUDY;  Surgeon: Hazle Lites, MD;  Location: [facility] ENDOSCOPY;  Service: Cardiovascular;;   CARDIOVERSION N/A 10/13/2020   Procedure: CARDIOVERSION;  Surgeon: Hazle Lites, MD;  Location: [facility] ENDOSCOPY;  Service: Cardiovascular;  Laterality: N/A;   CATARACT EXTRACTION  2016   at [facility]    CHOLECYSTECTOMY N/A 06/08/2023   Procedure: LAPAROSCOPIC CHOLECYSTECTOMY;  Surgeon: Junie Olds, MD;  Location: [facility] ORS;  Service: General;  Laterality: N/A;   FINE NEEDLE ASPIRATION  03/27/2020   Procedure: FINE NEEDLE ASPIRATION;  Surgeon: Denson Flake, MD;  Location: [facility] ENDOSCOPY;  Service: Cardiopulmonary;;   HIP SURGERY Left    IR KYPHO EA ADDL LEVEL THORACIC OR LUMBAR  10/12/2021   IR KYPHO LUMBAR INC FX REDUCE BONE BX UNI/BIL CANNULATION INC/IMAGING  10/12/2021   IR KYPHO LUMBAR INC FX REDUCE BONE BX UNI/BIL CANNULATION INC/IMAGING  07/08/2023   IR KYPHO THORACIC WITH BONE BIOPSY  09/07/2021   IR RADIOLOGIST EVAL & MGMT  09/03/2021   RADIOLOGY WITH ANESTHESIA N/A 10/12/2021   Procedure: L1 and L3 Kyphoplasty;  Surgeon: de Macedo Rodrigues, [place], MD;  Location[facility] OR;  Service: Radiology;  Laterality: N/A;   TEE WITHOUT CARDIOVERSION N/A 10/13/2020   Procedure: TRANSESOPHAGEAL ECHOCARDIOGRAM (TEE);  Surgeon: Hazle Lites, MD;  Location: [facility] ENDOSCOPY;  Service: Cardiovascular;  Laterality: N/A;   VIDEO BRONCHOSCOPY WITH ENDOBRONCHIAL ULTRASOUND N/A 03/27/2020   Procedure: VIDEO BRONCHOSCOPY WITH ENDOBRONCHIAL ULTRASOUND;  Surgeon: Denson Flake, MD;  Location: [facility] ENDOSCOPY;  Service: Cardiopulmonary;  Laterality: N/A;   Social History:  reports that he quit smoking about 7 years ago. His smoking use included cigarettes. He started smoking about 37 years ago. He has a 30 pack-year smoking history. He has been exposed to tobacco smoke. He has never used smokeless tobacco. He reports  that he does not currently use alcohol. He reports that he does not use drugs.  Family / Support Systems Marital Status: Married Patient Roles: Spouse, Other (Comment) (employee/friend) Spouse/Significant Other: Delores Fester [phone] Other Supports: Friends who can come by and check on him Anticipated Caregiver: irene and friends Ability/Limitations of Caregiver: wife can work some from home and some out  of the home Caregiver Availability: Other (Comment) (piece together care) Family Dynamics: Close with family and friends who will pull together to provide the care pt will need at home.  Pt hopeful he will recover and mod/i level  Social History Preferred language: English Religion: Christian Cultural Background: NA Education: Charity fundraiser - How often do you need to have someone help you when you read instructions, pamphlets, or other written material from your doctor or pharmacy?: Never Writes: Yes Employment Status: Employed Name of Employer: Psychologist, occupational for [employer] Return to Work Plans: Is working from Engineer, mining Issues: NA Guardian/Conservator: None-according to MD pt is capable of making his own decisions while here   Abuse/Neglect Abuse/Neglect Assessment Can Be Completed: Yes Physical Abuse: Denies Verbal Abuse: Denies Sexual Abuse: Denies Exploitation of patient/patient's resources: Denies Self-Neglect: Denies  Patient response to: Social Isolation - How often do you feel lonely or isolated from those around you?: Rarely  Emotional Status Pt's affect, behavior and adjustment status: Pt is motivated to recover and get back to his mod/i levle, he is glad the surgery is over and feels now can focus on his recovery. He hopes to do well here Recent Psychosocial Issues: other health issues are managed Psychiatric History: Hx-depression/anxiety takes medications for this and finds them helpful. Will ask for neuro-psych to see while here Substance Abuse History: NA  Patient / Family Perceptions, Expectations & Goals Pt/Family understanding of illness & functional limitations: Pt is able to explain his back surgery and other health issues. He does speak with the MD and feels he understands his plan moving forward. Premorbid pt/family roles/activities: husband, employee, friend, etc Anticipated changes in roles/activities/participation:  resume Pt/family expectations/goals: Pt states: " I hope to be independent when I leave here."  [employer]: None Premorbid Home Care/DME Agencies: Other (Comment) (has home O2 but has not used in 6 months) Transportation available at discharge: wife Is the patient able to respond to transportation needs?: Yes In the past 12 months, has lack of transportation kept you from medical appointments or from getting medications?: No In the past 12 months, has lack of transportation kept you from meetings, work, or from getting things needed for daily living?: No Resource referrals recommended: Neuropsychology  Discharge Planning Living Arrangements: Spouse/significant other Support Systems: Spouse/significant other, Other relatives, Friends/neighbors Type of Residence: Private residence Insurance Resources: Media planner (specify) Counselling psychologist) Financial Resources: Employment, Garment/textile technologist Screen Referred: No Living Expenses: Lives with family Money Management: Patient, Spouse Does the patient have any problems obtaining your medications?: No Home Management: Both Patient/Family Preliminary Plans: Return home with wife who works outside of the home and may be able to work  a few days from home when he goes home Care Coordinator Barriers to Discharge: Decreased caregiver support, Community education officer for SNF coverage Care Coordinator Anticipated Follow Up Needs: HH/OP  Clinical Impression: Pleasant gentleman who is motivated to do well here and regain his independence. Being evaluated today and goals being set for stay here    [name] 07/21/2023, 10:54 AM

## 2023-07-21 NOTE — Evaluation (Signed)
 Occupational Therapy Assessment and Plan  Patient Details  Name: John [name] MRN: [id number] Date of Birth: 01-02-1961  OT Diagnosis: lumbago (low back pain) and muscle weakness (generalized) Rehab Potential: Rehab Potential (ACUTE ONLY): Good ELOS: 7-10 days   Today's Date: 07/21/2023 OT Individual Time: 1300-1419 & 1300-1419 OT Individual Time Calculation (min): 79 min  & 79 min  Hospital Problem: Principal Problem:   Lumbar radiculopathy   Past Medical History:  Past Medical History:  Diagnosis Date   Arthritis    Rheumatoid   Bacteremia due to Pseudomonas    C. difficile colitis    Calculus of GB and bile duct w ac and chr cholecyst w obst 06/08/2023   Chronic anxiety 11/08/2014   Cough 09/22/2021   COVID 2020   mild case   Depression    Drug-induced neutropenia (HCC) 08/11/2020   Family history of adverse reaction to anesthesia    brother with seizures had episode under anesthesia.  Patient has seizures as well.   GERD (gastroesophageal reflux disease)    History of radiation therapy 04/24/2020-05/16/2020   IMRT to right lung     Dr Retta Caster   History of radiation therapy    08/10/21-08/19/21-Dr. Retta Caster   History of radiation therapy    Abdomen- 03/21/23-03/31/23-Dr. Retta Caster   Neutropenia [facility]) 05/12/2020   Neutropenic fever (HCC) 05/12/2020   Normocytic anemia 04/18/2021   PAF (paroxysmal atrial fibrillation) (HCC)    CHADS2VSAC score 3   Rheumatoid aortitis    Secondary hypercoagulable state (HCC) 10/28/2020   Sepsis (HCC) 10/06/2020   Shortness of breath 12/18/2020   Squamous cell lung cancer [facility])    Past Surgical History:  Past Surgical History:  Procedure Laterality Date   BRONCHIAL BRUSHINGS  03/27/2020   Procedure: BRONCHIAL BRUSHINGS;  Surgeon: Denson Flake, MD;  Location: [facility] ENDOSCOPY;  Service: Cardiopulmonary;;   BUBBLE STUDY  10/13/2020   Procedure: BUBBLE STUDY;  Surgeon: Hazle Lites, MD;  Location: [facility] ENDOSCOPY;  Service:  Cardiovascular;;   CARDIOVERSION N/A 10/13/2020   Procedure: CARDIOVERSION;  Surgeon: Hazle Lites, MD;  Location: [facility] ENDOSCOPY;  Service: Cardiovascular;  Laterality: N/A;   CATARACT EXTRACTION  2016   at [facility]   CHOLECYSTECTOMY N/A 06/08/2023   Procedure: LAPAROSCOPIC CHOLECYSTECTOMY;  Surgeon: Junie Olds, MD;  Location: [facility] ORS;  Service: General;  Laterality: N/A;   FINE NEEDLE ASPIRATION  03/27/2020   Procedure: FINE NEEDLE ASPIRATION;  Surgeon: Denson Flake, MD;  Location: [facility] ENDOSCOPY;  Service: Cardiopulmonary;;   HIP SURGERY Left    IR KYPHO EA ADDL LEVEL THORACIC OR LUMBAR  10/12/2021   IR KYPHO LUMBAR INC FX REDUCE BONE BX UNI/BIL CANNULATION INC/IMAGING  10/12/2021   IR KYPHO LUMBAR INC FX REDUCE BONE BX UNI/BIL CANNULATION INC/IMAGING  07/08/2023   IR KYPHO THORACIC WITH BONE BIOPSY  09/07/2021   IR RADIOLOGIST EVAL & MGMT  09/03/2021   RADIOLOGY WITH ANESTHESIA N/A 10/12/2021   Procedure: L1 and L3 Kyphoplasty;  Surgeon: de Macedo Rodrigues, [place], MD;  Location: [facility] OR;  Service: Radiology;  Laterality: N/A;   TEE WITHOUT CARDIOVERSION N/A 10/13/2020   Procedure: TRANSESOPHAGEAL ECHOCARDIOGRAM (TEE);  Surgeon: Hazle Lites, MD;  Location: [facility] ENDOSCOPY;  Service: Cardiovascular;  Laterality: N/A;   VIDEO BRONCHOSCOPY WITH ENDOBRONCHIAL ULTRASOUND N/A 03/27/2020   Procedure: VIDEO BRONCHOSCOPY WITH ENDOBRONCHIAL ULTRASOUND;  Surgeon: Denson Flake, MD;  Location: [facility] ENDOSCOPY;  Service: Cardiopulmonary;  Laterality: N/A;    Assessment & Plan Clinical Impression: John  [name] is a 63 year old right handed male with history significant for chronic back pain 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 Harvie Liner with past attempts at cardioversion, anxiety/depression.  Per chart review patient lives with spouse.  1 level home 4 steps to entry.  Patient reportedly independent at baseline prior to recent hospitalization.  [facility]ed 07/02/[facility] to [facility] with increasing back pain and shortness of breath.  Patient had reported progressive back pain over the past few months radiating to the lower extremities maintained on Percocet 5-3 25 1-2 tabs every 6 hours as needed as well as Lidoderm  patch.  Noted oxygen saturations 88% which improved with 6 L.  Chemistry showed sodium 124 potassium 3.3, chloride 91, total bilirubin 1.6, WBC 1.3, blood cultures no growth to date.  CT angiogram of the chest showed no evidence for pulmonary emboli.  Stable multifocal areas of groundglass opacity throughout both lungs along with some peripheral reticular opacities.  MRI thoracic spine showed no evidence for metastatic disease within the thoracic spine.  Evolving subacute compression fracture involving the superior endplate of T11 with no more than mild residual marrow edema.  Height loss not significantly changed or progressed as compared to prior MRI 06/06/2023.  Chronic compression deformities involving T12 and L1 vertebral bodies with sequela of prior vertebral augmentation stable.  MRI lumbar spine showed acute compression fractures involving the inferior endplate of L2 with up to 60% height loss without significant bony retropulsion.  Additional chronic compression deformities of all of the T12, L1, L3, L4 and L5 vertebral bodies otherwise stable.  Left foraminal to extraforaminal disc protrusion at L3-4, potentially affecting the exiting left L3 nerve root.  There was a 3.7 cm intra-abdominal aortic aneurysm recommendations of follow-up every 2 years.  Underwent image guided L2  vertebral augmentation kyphoplasty technique 07/08/2023 per Dr. Blinda Burger of interventional radiology.  Hospital course follow-up oncology services Dr. Maria Shiner in regards to neutropenia he did receive a trial of Neupogen  with latest WBC 7.4.  Patient remains on chronic Eliquis  for history of atrial fibrillation.  Hyponatremia improved 130-134 133 maintained on sodium chloride  tablets.  Palliative care was consulted to establish goals of care.  Pain management with the use of Durogesic patch/lighted Derm patch with Robaxin  as needed and Dilaudid  3 mg every 4 hours breakthrough pain as needed.  Therapy evaluations completed due to patient's decreased functional mobility was admitted for a comprehensive rehab program. Complains of LLE weakness.  Patient transferred to CIR on 07/20/2023 .    Patient currently requires min with basic self-care skills secondary to muscle weakness, decreased cardiorespiratoy endurance, and decreased standing balance, decreased postural control, and decreased balance strategies.  Prior to hospitalization, patient could complete ADLs with independent .  Patient will benefit from skilled intervention to decrease level of assist with basic self-care skills and increase independence with basic self-care skills prior to discharge home with care partner.  Anticipate patient will require intermittent supervision and follow up outpatient.  OT - End of Session Activity Tolerance: Tolerates 30+ min activity with multiple rests Endurance Deficit: Yes OT Assessment Rehab Potential (ACUTE ONLY): Good OT Patient demonstrates impairments in the following area(s):  Balance;Endurance;Safety OT Basic ADL's Functional Problem(s): Eating;Grooming;Bathing;Dressing;Toileting OT Transfers Functional Problem(s): Toilet;Tub/Shower OT Additional Impairment(s): None OT Plan OT Intensity: Minimum of 1-2 x/day, 45 to 90 minutes OT Frequency: 5 out of 7 days OT Duration/Estimated Length of Stay: 7-10  days OT Treatment/Interventions: Balance/vestibular training;Discharge planning;Pain management;Self Care/advanced ADL retraining;Therapeutic Activities;UE/LE Coordination activities;Disease mangement/prevention;Functional mobility training;Patient/family education;Skin care/wound managment;Therapeutic Exercise;Community reintegration;DME/adaptive equipment instruction;Neuromuscular re-education;Psychosocial support;Splinting/orthotics;UE/LE Strength taining/ROM;Wheelchair propulsion/positioning OT Self Feeding Anticipated Outcome(s): mod I OT Basic Self-Care Anticipated Outcome(s): mod I OT Toileting Anticipated Outcome(s): mod I OT Bathroom Transfers Anticipated Outcome(s): mod I OT Recommendation Recommendations for Other Services: Therapeutic Recreation consult Therapeutic Recreation Interventions: Pet therapy Patient destination: Home Follow Up Recommendations: Outpatient OT Equipment Recommended: To be determined Equipment Details: owns shower chair and RW   OT Evaluation Precautions/Restrictions  Precautions Precautions: Back;Fall Precaution Booklet Issued: Yes (comment) Recall of Precautions/Restrictions: Intact Precaution/Restrictions Comments: educated on precautions, able to state 3/3 Restrictions Weight Bearing Restrictions Per Provider Order: No General Chart Reviewed: Yes Response to Previous Treatment: Not applicable Family/Caregiver Present: No Pain Pain Assessment Pain Scale: 0-10 Pain Score: 4  Pain Type: Surgical pain;Chronic pain Pain Location: Back Pain Orientation: Lower Pain Descriptors / Indicators: Aching Pain Intervention(s): Medication (See eMAR) Home Living/Prior Functioning Home Living Family/patient expects to be discharged to:: Private residence Living Arrangements: Spouse/significant other Available Help at Discharge: Family, Friend(s), Available 24 hours/day Type of Home: House Home Access: Ramped entrance Entrance Stairs-Number of Steps:  4 Entrance Stairs-Rails: Left Home Layout: One level Bathroom Shower/Tub: Walk-in shower (wih seat) Bathroom Toilet: Standard Bathroom Accessibility: Yes Additional Comments: works from home and wife able to work from home  Lives With: Spouse IADL History Homemaking Responsibilities: Yes Meal Prep Responsibility: Careers adviser Responsibility: Primary Cleaning Responsibility: Primary Bill Paying/Finance Responsibility: Primary Shopping Responsibility: Primary Child Care Responsibility: No Current License: Yes Mode of Transportation: Other (comment), Car (and truck) Occupation: Full time employment Type of Occupation: work from home 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 Prior Function Level of Independence: Independent with basic ADLs, Independent with gait, Independent with transfers  Able to Take Stairs?: Yes Driving: Yes Vocation: Full time employment Leisure: Hobbies-yes (Comment) Vision Baseline Vision/History: 1 Wears glasses Wears Glasses: Reading only Ability to See in Adequate Light: 0 Adequate Patient Visual Report: No change from baseline Vision Assessment?: No apparent visual deficits Perception  Perception: Within Functional Limits Praxis Praxis: WFL Cognition Cognition Overall Cognitive Status: Within Functional Limits for tasks assessed Arousal/Alertness: Awake/alert Orientation Level: Person;Place;Situation Person: Oriented Place: Oriented Situation: Oriented Memory: Appears intact Awareness: Appears intact Problem Solving: Appears intact Safety/Judgment: Appears intact Brief Interview for Mental Status (BIMS) Repetition of Three Words (First Attempt): 3 Temporal Orientation: Year: Correct Temporal Orientation: Month: Accurate within 5 days Temporal Orientation: Day: Correct Recall: "Sock": Yes, no cue required Recall: "Blue": Yes, no cue required Recall: "Bed": Yes, no cue required BIMS Summary Score: 15 Sensation Sensation Light  Touch: Appears Intact Hot/Cold: Appears Intact Proprioception: Appears Intact Stereognosis: Not tested Coordination Gross Motor Movements are Fluid and Coordinated: No Coordination and Movement Description: 2/2 injury and weakness Motor  Motor Motor: Abnormal postural alignment and control Motor - Skilled Clinical Observations: 2/2 weakness  Trunk/Postural Assessment  Cervical Assessment Cervical Assessment: Within Functional Limits Thoracic Assessment Thoracic Assessment: Exceptions to [facility] (back precautions) Lumbar Assessment Lumbar Assessment: Exceptions to [facility] (posterior pelvic tilt) Postural Control Postural Control: Deficits on evaluation (delayed)  Balance Balance Balance Assessed: Yes Static Sitting Balance Static Sitting - Balance Support: Feet supported;No upper extremity supported  Static Sitting - Level of Assistance: 6: Modified independent (Device/Increase time) Dynamic Sitting Balance Dynamic Sitting - Balance Support: During functional activity Dynamic Sitting - Level of Assistance: 6: Modified independent (Device/Increase time) Static Standing Balance Static Standing - Balance Support: During functional activity Static Standing - Level of Assistance: 4: Min assist Dynamic Standing Balance Dynamic Standing - Level of Assistance: 4: Min assist Extremity/Trunk Assessment RUE Assessment RUE Assessment: Within Functional Limits General Strength Comments: 4+/5 overall LUE Assessment LUE Assessment: Within Functional Limits General Strength Comments: 4+/5 overall  Care Tool Care Tool Self Care Eating   Eating Assist Level: Set up assist    Oral Care    Oral Care Assist Level: Set up assist    Bathing         Assist Level: Contact Guard/Touching assist    Upper Body Dressing(including orthotics)   What is the patient wearing?: Pull over shirt   Assist Level: Supervision/Verbal cueing    Lower Body Dressing (excluding footwear)   What is the patient  wearing?: Pants;Underwear/pull up Assist for lower body dressing: Minimal Assistance - Patient > 75%    Putting on/Taking off footwear   What is the patient wearing?: Socks Assist for footwear: Minimal Assistance - Patient > 75%       Care Tool Toileting Toileting activity   Assist for toileting: Contact Guard/Touching assist     Care Tool Bed Mobility Roll left and right activity   Roll left and right assist level: Supervision/Verbal cueing    Sit to lying activity   Sit to lying assist level: Contact Guard/Touching assist    Lying to sitting on side of bed activity   Lying to sitting on side of bed assist level: the ability to move from lying on the back to sitting on the side of the bed with no back support.: Contact Guard/Touching assist     Care Tool Transfers Sit to stand transfer   Sit to stand assist level: Contact Guard/Touching assist    Chair/bed transfer   Chair/bed transfer assist level: Minimal Assistance - Patient > 75%     Toilet transfer   Assist Level: Minimal Assistance - Patient > 75%     Care Tool Cognition  Expression of Ideas and Wants Expression of Ideas and Wants: 4. Without difficulty (complex and basic) - expresses complex messages without difficulty and with speech that is clear and easy to understand  Understanding Verbal and Non-Verbal Content Understanding Verbal and Non-Verbal Content: 4. Understands (complex and basic) - clear comprehension without cues or repetitions   Memory/Recall Ability Memory/Recall Ability : Current season;Location of own room;Staff names and faces;That he or she is in a hospital/hospital unit   Refer to Care Plan for Long Term Goals  SHORT TERM GOAL WEEK 1 OT Short Term Goal 1 (Week 1): STG=LTG d/t ELOS  Recommendations for other services: Therapeutic Recreation  Pet therapy   Skilled Therapeutic Intervention ADL ADL Equipment Provided: Reacher;Long-handled sponge Eating: Set up Where Assessed-Eating:  Chair Grooming: Setup Where Assessed-Grooming: Chair;Sitting at sink Upper Body Bathing: Supervision/safety Where Assessed-Upper Body Bathing: Shower Lower Body Bathing: Contact guard Where Assessed-Lower Body Bathing: Shower Upper Body Dressing: Setup Where Assessed-Upper Body Dressing: Chair Lower Body Dressing: Contact guard Where Assessed-Lower Body Dressing: Sitting at sink;Standing at sink Toileting: Contact guard Where Assessed-Toileting: Toilet;Bedside Commode Toilet Transfer: Minimal assistance;Contact guard Toilet Transfer Method: Proofreader: Bedside commode Tub/Shower Transfer: Minimal assistance Tub/Shower Transfer Method: Ambulating Tub/Shower Equipment: Walk in shower;Transfer tub bench;Grab bars  Walk-In Shower Transfer: Administrator, arts Method: Designer, industrial/product: Transfer tub bench;Grab bars ADL Comments: O2 donned at 4L with ADL tasks, requiring frequent rest breaks d/t low endurance Mobility  Bed Mobility Bed Mobility: Supine to Sit;Sit to Supine Supine to Sit: Supervision/Verbal cueing Sit to Supine: Supervision/Verbal cueing Transfers Sit to Stand: Contact Guard/Touching assist Stand to Sit: Contact Guard/Touching assist  Session 1 1:1 evaluation and treatment session initiated this date. OT roles, goals and purpose discussed with pt as well as therapy schedule. ADL completed this date with levels of assist listed above. Pt would benefit from skilled OT in IPR setting in order to maximize independence with ADLs upon D/C.    Session 2 General: "That was great" Pt seated in recliner upon OT arrival, agreeable to OT.  Pain: see above  ADL: OT providing skilled intervention on ADL retraining in order to increase independence with tasks and increase activity tolerance. Pt completed the following tasks at the current level of assist: Bed mobility: SBA with flatbed with use of bed rail Toilet  transfer: CGA, Min A with slight Lt knee buckling Toileting: CGA, 3/3 tasks completed with no LOB, pt voiding urine UB dressing: set up with donning/doffing overhead shirt LB dressing: figure 4 method used, Min A overall, standing at RW to manage over waist Footwear: Min A with no AD using figure 4 method Shower transfer: CGA from toilet>TTB with RW Bathing: CGA if standing, pt seated on TTB with long handle sponge  Pt required VC for energy conservation strategies with sligth fatigue and SOB during tasks with 4L O2, stats stable.    Other Treatments: OT providing handout for energy conservation describing what it is, purpose of conserving energy and specific examples of how to conserve energy during ADL and IADL tasks. Pt appreciative for information with good carryover.  Pt supine in bed with bed alarm activated, 2 bed rails up, call light within reach and 4Ps assessed.   Discharge Criteria: Patient will be discharged from OT if patient refuses treatment 3 consecutive times without medical reason, if treatment goals not met, if there is a change in medical status, if patient makes no progress towards goals or if patient is discharged from hospital.  The above assessment, treatment plan, treatment alternatives and goals were discussed and mutually agreed upon: by patient  Nila Barth, OTD, OTR/L 07/21/2023, 2:26 PM

## 2023-07-21 NOTE — Evaluation (Signed)
 Physical Therapy Assessment and Plan  Patient Details  Name: John [name] MRN: [id number] Date of Birth: 12/13/1960  PT Diagnosis: Abnormality of gait, Difficulty walking, Low back pain, Muscle weakness, and Pain in joint Rehab Potential: Good ELOS: 7-10 days   Today's Date: 07/21/2023 PT Individual Time: 9528-4132 PT Individual Time Calculation (min): 60 min    Hospital Problem: Principal Problem:   Lumbar radiculopathy   Past Medical History:  Past Medical History:  Diagnosis Date   Arthritis    Rheumatoid   Bacteremia due to Pseudomonas    C. difficile colitis    Calculus of GB and bile duct w ac and chr cholecyst w obst 06/08/2023   Chronic anxiety 11/08/2014   Cough 09/22/2021   COVID 2020   mild case   Depression    Drug-induced neutropenia (HCC) 08/11/2020   Family history of adverse reaction to anesthesia    brother with seizures had episode under anesthesia.  Patient has seizures as well.   GERD (gastroesophageal reflux disease)    History of radiation therapy 04/24/2020-05/16/2020   IMRT to right lung     Dr Retta Caster   History of radiation therapy    08/10/21-08/19/21-Dr. Retta Caster   History of radiation therapy    Abdomen- 03/21/23-03/31/23-Dr. Retta Caster   Neutropenia [facility]) 05/12/2020   Neutropenic fever (HCC) 05/12/2020   Normocytic anemia 04/18/2021   PAF (paroxysmal atrial fibrillation) (HCC)    CHADS2VSAC score 3   Rheumatoid aortitis    Secondary hypercoagulable state (HCC) 10/28/2020   Sepsis (HCC) 10/06/2020   Shortness of breath 12/18/2020   Squamous cell lung cancer [facility])    Past Surgical History:  Past Surgical History:  Procedure Laterality Date   BRONCHIAL BRUSHINGS  03/27/2020   Procedure: BRONCHIAL BRUSHINGS;  Surgeon: Denson Flake, MD;  Location: [facility] ENDOSCOPY;  Service: Cardiopulmonary;;   BUBBLE STUDY  10/13/2020   Procedure: BUBBLE STUDY;  Surgeon: Hazle Lites, MD;  Location: [facility] ENDOSCOPY;  Service: Cardiovascular;;    CARDIOVERSION N/A 10/13/2020   Procedure: CARDIOVERSION;  Surgeon: Hazle Lites, MD;  Location: [facility] ENDOSCOPY;  Service: Cardiovascular;  Laterality: N/A;   CATARACT EXTRACTION  2016   at [facility]   CHOLECYSTECTOMY N/A 06/08/2023   Procedure: LAPAROSCOPIC CHOLECYSTECTOMY;  Surgeon: Junie Olds, MD;  Location: [facility] ORS;  Service: General;  Laterality: N/A;   FINE NEEDLE ASPIRATION  03/27/2020   Procedure: FINE NEEDLE ASPIRATION;  Surgeon: Denson Flake, MD;  Location: [facility] ENDOSCOPY;  Service: Cardiopulmonary;;   HIP SURGERY Left    IR KYPHO EA ADDL LEVEL THORACIC OR LUMBAR  10/12/2021   IR KYPHO LUMBAR INC FX REDUCE BONE BX UNI/BIL CANNULATION INC/IMAGING  10/12/2021   IR KYPHO LUMBAR INC FX REDUCE BONE BX UNI/BIL CANNULATION INC/IMAGING  07/08/2023   IR KYPHO THORACIC WITH BONE BIOPSY  09/07/2021   IR RADIOLOGIST EVAL & MGMT  09/03/2021   RADIOLOGY WITH ANESTHESIA N/A 10/12/2021   Procedure: L1 and L3 Kyphoplasty;  Surgeon: de Macedo Rodrigues, [place], MD;  Location: [facility] OR;  Service: Radiology;  Laterality: N/A;   TEE WITHOUT CARDIOVERSION N/A 10/13/2020   Procedure: TRANSESOPHAGEAL ECHOCARDIOGRAM (TEE);  Surgeon: Hazle Lites, MD;  Location: [facility] ENDOSCOPY;  Service: Cardiovascular;  Laterality: N/A;   VIDEO BRONCHOSCOPY WITH ENDOBRONCHIAL ULTRASOUND N/A 03/27/2020   Procedure: VIDEO BRONCHOSCOPY WITH ENDOBRONCHIAL ULTRASOUND;  Surgeon: Denson Flake, MD;  Location: [facility] ENDOSCOPY;  Service: Cardiopulmonary;  Laterality: N/A;    Assessment & Plan Clinical Impression: John [name] is  a 63 year old right handed male with history significant for chronic back pain 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 Harvie Liner with past attempts at cardioversion, anxiety/depression.  Per chart review patient lives with spouse.  1 level home 4 steps to entry.  Patient reportedly independent at baseline prior to recent hospitalization.  Presented 07/02/2023 to [facility] with increasing back pain and shortness of breath.  Patient had reported progressive back pain over the past few months radiating to the lower extremities maintained on Percocet 5-3 25 1-2 tabs every 6 hours as needed as well as Lidoderm  patch.  Noted oxygen saturations 88% which improved with 6 L.  Chemistry showed sodium 124 potassium 3.3, chloride 91, total bilirubin 1.6, WBC 1.3, blood cultures no growth to date.  CT angiogram of the chest showed no evidence for pulmonary emboli.  Stable multifocal areas of groundglass opacity throughout both lungs along with some peripheral reticular opacities.  MRI thoracic spine showed no evidence for metastatic disease within the thoracic spine.  Evolving subacute compression fracture involving the superior endplate of T11 with no more than mild residual marrow edema.  Height loss not significantly changed or progressed as compared to prior MRI 06/06/2023.  Chronic compression deformities involving T12 and L1 vertebral bodies with sequela of prior vertebral augmentation stable.  MRI lumbar spine showed acute compression fractures involving the inferior endplate of L2 with up to 60% height loss without significant bony retropulsion.  Additional chronic compression deformities of all of the T12, L1, L3, L4 and L5 vertebral bodies otherwise stable.  Left foraminal to extraforaminal disc protrusion at L3-4, potentially affecting the exiting left L3 nerve root.  There was a 3.7 cm intra-abdominal aortic aneurysm recommendations of follow-up every 2 years.  Underwent image guided L2 vertebral augmentation  kyphoplasty technique 07/08/2023 per Dr. Blinda Burger of interventional radiology.  Hospital course follow-up oncology services Dr. Maria Shiner in regards to neutropenia he did receive a trial of Neupogen  with latest WBC 7.4.  Patient remains on chronic Eliquis  for history of atrial fibrillation.  Hyponatremia improved 130-134 133 maintained on sodium chloride  tablets.  Palliative care was consulted to establish goals of care.  Pain management with the use of Durogesic patch/lighted Derm patch with Robaxin  as needed and Dilaudid  3 mg every 4 hours breakthrough pain as needed.  Therapy evaluations completed due to patient's decreased functional mobility was admitted for a comprehensive rehab program. Complains of LLE weakness.   Patient transferred to CIR on 07/20/2023 .   Patient currently requires min with mobility secondary to muscle weakness and muscle joint tightness, decreased cardiorespiratoy endurance and decreased oxygen support, and decreased standing balance and decreased balance strategies.  Prior to hospitalization, patient was modified independent  with mobility and lived with Spouse in a House home.  Home access is 4Ramped entrance.  Patient will benefit from skilled PT intervention to maximize safe functional mobility, minimize fall risk, and decrease caregiver burden for planned discharge home with 24 hour supervision.  Anticipate patient will benefit from follow up HH at discharge.  PT - End of Session Activity Tolerance: Tolerates 10 - 20 min activity with multiple rests Endurance Deficit: Yes  PT Assessment Rehab Potential (ACUTE/IP ONLY): Good PT Patient demonstrates impairments in the following area(s): Endurance;Balance;Safety;Motor;Pain;Skin Integrity PT Transfers Functional Problem(s): Bed Mobility;Bed to Chair;Car;Furniture;Floor PT Locomotion Functional Problem(s): Ambulation;Wheelchair Mobility;Stairs PT Plan PT Intensity: Minimum of 1-2 x/day ,45 to 90 minutes PT Frequency: 5 out  of 7 days PT Duration Estimated Length of Stay: 7-10 days PT Treatment/Interventions: Ambulation/gait training;DME/adaptive equipment instruction;Neuromuscular re-education;UE/LE Strength taining/ROM;Wheelchair propulsion/positioning;Therapeutic Activities;Skin care/wound management;Pain management;Discharge planning;Functional mobility training;Patient/family education;Therapeutic Exercise;Balance/vestibular training;Community reintegration;Disease management/prevention;Psychosocial support;Stair training;Splinting/orthotics PT Transfers Anticipated Outcome(s): mod I / Supervision (for O2 management) PT Locomotion Anticipated Outcome(s): mod I / Supervision (for O2 management) ambulatory PT Recommendation Recommendations for Other Services: Therapeutic Recreation consult Therapeutic Recreation Interventions: Outing/community reintergration Follow Up Recommendations: Home health PT Patient destination: Home Equipment Recommended: None recommended by PT Equipment Details: pt has w/c and walker   PT Evaluation Precautions/Restrictions Precautions Precautions: Back;Fall Precaution Booklet Issued: Yes (comment) Recall of Precautions/Restrictions: Intact Precaution/Restrictions Comments: educated on precautions, able to state 3/3 Restrictions Weight Bearing Restrictions Per Provider Order: No General   Vital SignsTherapy Vitals Temp: 97.9 F (36.6 C) Pulse Rate: 75 Resp: 18 BP: 103/74 Patient Position (if appropriate): Lying Oxygen Therapy SpO2: 95 % O2 Device: Nasal Cannula Pain Pain Assessment Pain Scale: 0-10 Pain Score: 4  Pain Type: Surgical pain;Chronic pain Pain Location: Back Pain Orientation: Lower Pain Descriptors / Indicators: Aching Pain Intervention(s): Medication (See eMAR) Pain Interference Pain Interference Pain Effect on Sleep: 2. Occasionally Pain Interference with Therapy Activities: 2. Occasionally Pain Interference with Day-to-Day Activities: 2.  Occasionally Home Living/Prior Functioning Home Living Available Help at Discharge: Family;Friend(s);Available 24 hours/day Type of Home: House Home Access: Ramped entrance Entrance Stairs-Number of Steps: 4 Entrance Stairs-Rails: Left Home Layout: One level Bathroom Shower/Tub: Walk-in shower (wih seat) Bathroom Toilet: Standard Bathroom Accessibility: Yes Additional Comments: works from home and wife able to work from home  Lives With: Spouse Prior Function Level of Independence: Independent with basic ADLs;Independent with gait;Independent with transfers  Able to Take Stairs?: Yes Driving: Yes Vocation: Full time employment Leisure: Hobbies-yes (Comment) Vision/Perception  Vision - History Ability to See in Adequate Light: 0 Adequate Perception Perception: Within Functional Limits Praxis Praxis: WFL  Cognition Overall Cognitive Status: Within Functional Limits for tasks assessed Arousal/Alertness: Awake/alert Orientation Level: Oriented X4 Memory: Appears intact Awareness: Appears intact Problem Solving: Appears intact Safety/Judgment: Appears intact Sensation Sensation Light Touch: Appears Intact Hot/Cold: Appears Intact Proprioception: Appears Intact Stereognosis: Not tested Coordination Gross Motor Movements are Fluid and Coordinated: No Coordination and Movement Description: 2/2 injury and weakness Motor  Motor Motor: Abnormal postural alignment and control Motor - Skilled Clinical Observations: 2/2 weakness   Trunk/Postural Assessment  Cervical Assessment Cervical Assessment: Within Functional Limits Thoracic Assessment Thoracic Assessment: Exceptions to [facility] (back precautions) Lumbar Assessment Lumbar Assessment: Exceptions to [facility] (posterior pelvic tilt) Postural Control Postural Control: Deficits on evaluation (delayed)  Balance Balance Balance Assessed: Yes Static Sitting Balance Static Sitting - Balance Support: Feet supported;No upper  extremity supported Static Sitting - Level of Assistance: 6: Modified independent (Device/Increase time) Dynamic Sitting Balance Dynamic Sitting - Balance Support: During functional activity Dynamic Sitting - Level of Assistance: 6: Modified independent (Device/Increase time) Static Standing Balance Static Standing - Balance Support: During functional activity Static Standing - Level of Assistance: 4: Min assist Dynamic Standing Balance Dynamic Standing - Level of Assistance: 4: Min assist Extremity Assessment  RUE Assessment RUE Assessment: Within Functional Limits General Strength Comments: 4+/5 overall LUE Assessment LUE Assessment: Within Functional Limits General Strength Comments:  4+/5 overall RLE Assessment RLE Assessment: Within Functional Limits LLE Assessment LLE Assessment: Exceptions to [facility] Active Range of Motion (AROM) Comments: dorsiflexion limited 25%  Care Tool Care Tool Bed Mobility Roll left and right activity   Roll left and right assist level: Supervision/Verbal cueing    Sit to lying activity   Sit to lying assist level: Contact Guard/Touching assist    Lying to sitting on side of bed activity   Lying to sitting on side of bed assist level: the ability to move from lying on the back to sitting on the side of the bed with no back support.: Contact Guard/Touching assist     Care Tool Transfers Sit to stand transfer   Sit to stand assist level: Contact Guard/Touching assist    Chair/bed transfer   Chair/bed transfer assist level: Minimal Assistance - Patient > 75%    Car transfer   Car transfer assist level: Contact Guard/Touching assist      Care Tool Locomotion Ambulation     Assistive device: Walker-rolling Max distance: 40  Walk 10 feet activity   Assist level: 2 helpers (+2 for w/c follow, min A)     Walk 50 feet with 2 turns activity Walk 50 feet with 2 turns activity did not occur: Safety/medical concerns (endurance deficit)      Walk  150 feet activity Walk 150 feet activity did not occur: Safety/medical concerns      Walk 10 feet on uneven surfaces activity Walk 10 feet on uneven surfaces activity did not occur: Safety/medical concerns      Stairs   Assist level: Moderate Assistance - Patient - 50 - 74% Stairs assistive device: 2 hand rails Max number of stairs: 8 (3" height)  Walk up/down 1 step activity   Walk up/down 1 step (curb) assist level: Moderate Assistance - Patient - 50 - 74% Walk up/down 1 step or curb assistive device: 2 hand rails  Walk up/down 4 steps activity   Walk up/down 4 steps assist level: Moderate Assistance - Patient - 50 - 74% Walk up/down 4 steps assistive device: 2 hand rails  Walk up/down 12 steps activity Walk up/down 12 steps activity did not occur: Safety/medical concerns (enduace deficits)      Pick up small objects from floor   Pick up small object from the floor assist level: Minimal Assistance - Patient > 75% Pick up small object from the floor assistive device: walker and reacher  Wheelchair Is the patient using a wheelchair?: Yes Type of Wheelchair: Manual   Wheelchair assist level: Dependent - Patient 0% Max wheelchair distance: 150 ft  Wheel 50 feet with 2 turns activity   Assist Level: Dependent - Patient 0%  Wheel 150 feet activity   Assist Level: Dependent - Patient 0%    Refer to Care Plan for Long Term Goals  SHORT TERM GOAL WEEK 1 PT Short Term Goal 1 (Week 1): = LTG  Recommendations for other services: Therapeutic Recreation  Outing/community reintegration  Skilled Therapeutic Intervention Mobility Bed Mobility Bed Mobility: Supine to Sit;Sit to Supine Supine to Sit: Supervision/Verbal cueing Sit to Supine: Supervision/Verbal cueing Transfers Transfers: Stand to Sit;Sit to Stand;Stand Pivot Transfers;Transfer Sit to Stand: Contact Guard/Touching assist Stand to Sit: Contact Guard/Touching assist Stand Pivot Transfers: Contact Guard/Touching  assist;Minimal Assistance - Patient > 75% Stand Pivot Transfer Details: Visual cues for safe use of DME/AE Transfer (Assistive device): Rolling walker Locomotion  Gait Ambulation: Yes Gait Assistance: Minimal Assistance - Patient > 75% Gait Distance (  Feet): 40 Feet Assistive device: Rolling walker Gait Assistance Details: Visual cues for safe use of DME/AE;Verbal cues for precautions/safety Gait Gait: Yes Gait Pattern: Decreased dorsiflexion - left Stairs / Additional Locomotion Stairs: Yes Stairs Assistance: Moderate Assistance - Patient 50 - 74% Stair Management Technique: Two rails Number of Stairs: 8 Height of Stairs: 3 Wheelchair Mobility Wheelchair Mobility: Yes Wheelchair Assistance: Dependent - Patient 0% Distance: 150  Evaluation completed (see details above and below) with education on PT POC and goals and individual treatment initiated with focus on endurance, gait with RW, safe transfers, stair negotiation. Pt ambulated with RW x2 for functional strengthening/endurance training. Pt demonstrates good gait without deviation, pt able to transfer into recliner post tx with without cueing.  Pt's SPO2 monitored during activity intermittently, dropped to 85-86% with recovery of 1-2 minutes to 90%. Pt fatigued post tx, demonstrated good diaphragmatic breathing techniques.   Discharge Criteria: Patient will be discharged from PT if patient refuses treatment 3 consecutive times without medical reason, if treatment goals not met, if there is a change in medical status, if patient makes no progress towards goals or if patient is discharged from hospital.  The above assessment, treatment plan, treatment alternatives and goals were discussed and mutually agreed upon: by patient  [name] 07/21/2023, 2:50 PM

## 2023-07-21 NOTE — Progress Notes (Signed)
 Inpatient Rehabilitation Center Individual Statement of Services  Patient Name:  John [name]  Date:  07/21/2023  Welcome to the Inpatient Rehabilitation Center.  Our goal is to provide you with an individualized program based on your diagnosis and situation, designed to meet your specific needs.  With this comprehensive rehabilitation program, you will be expected to participate in at least 3 hours of rehabilitation therapies Monday-Friday, with modified therapy programming on the weekends.  Your rehabilitation program will include the following services:  Physical Therapy (PT), Occupational Therapy (OT), 24 hour per day rehabilitation nursing, Neuropsychology, Care Coordinator, Rehabilitation Medicine, Nutrition Services, and Pharmacy Services  Weekly team conferences will be held on Tuesday to discuss your progress.  Your Inpatient Rehabilitation Care Coordinator will talk with you frequently to get your input and to update you on team discussions.  Team conferences with you and your family in attendance may also be held.  Expected length of stay: 7-10 days  Overall anticipated outcome: mod/I-supervision level  Depending on your progress and recovery, your program may change. Your Inpatient Rehabilitation Care Coordinator will coordinate services and will keep you informed of any changes. Your Inpatient Rehabilitation Care Coordinator's name and contact numbers are listed  below.  The following services may also be recommended but are not provided by the Inpatient Rehabilitation Center:  Driving Evaluations Home Health Rehabiltiation Services Outpatient Rehabilitation Services Vocational Rehabilitation   Arrangements will be made to provide these services after discharge if needed.  Arrangements include referral to agencies that provide these services.  Your insurance has been verified to be:  [place]  Your primary doctor is:    Pertinent information will be shared with your doctor and  your insurance company.  Inpatient Rehabilitation Care Coordinator:  Adrianna Albee, Buzz Cass [phone] or Justine Oms  Information discussed with and copy given to patient by: [name], 07/21/2023, 10:56 AM

## 2023-07-21 NOTE — Progress Notes (Signed)
 Inpatient Rehabilitation  Patient information reviewed and entered into eRehab system by Feliberto Gottron, M.A., CCC-SLP, Rehab Quality Coordinator.  Information including medical coding, functional ability and quality indicators will be reviewed and updated through discharge.

## 2023-07-21 NOTE — Plan of Care (Signed)
  Problem: RH Eating Goal: LTG Patient will perform eating w/assist, cues/equip (OT) Description: LTG: Patient will perform eating with assist, with/without cues using equipment (OT) Flowsheets (Taken 07/21/2023 1238) LTG: Pt will perform eating with assistance level of: Independent with assistive device    Problem: RH Grooming Goal: LTG Patient will perform grooming w/assist,cues/equip (OT) Description: LTG: Patient will perform grooming with assist, with/without cues using equipment (OT) Flowsheets (Taken 07/21/2023 1238) LTG: Pt will perform grooming with assistance level of: Independent with assistive device    Problem: RH Bathing Goal: LTG Patient will bathe all body parts with assist levels (OT) Description: LTG: Patient will bathe all body parts with assist levels (OT) Flowsheets (Taken 07/21/2023 1238) LTG: Pt will perform bathing with assistance level/cueing: Independent with assistive device    Problem: RH Dressing Goal: LTG Patient will perform upper body dressing (OT) Description: LTG Patient will perform upper body dressing with assist, with/without cues (OT). Flowsheets (Taken 07/21/2023 1238) LTG: Pt will perform upper body dressing with assistance level of: Independent with assistive device Goal: LTG Patient will perform lower body dressing w/assist (OT) Description: LTG: Patient will perform lower body dressing with assist, with/without cues in positioning using equipment (OT) Flowsheets (Taken 07/21/2023 1238) LTG: Pt will perform lower body dressing with assistance level of: Independent with assistive device   Problem: RH Toileting Goal: LTG Patient will perform toileting task (3/3 steps) with assistance level (OT) Description: LTG: Patient will perform toileting task (3/3 steps) with assistance level (OT)  Flowsheets (Taken 07/21/2023 1238) LTG: Pt will perform toileting task (3/3 steps) with assistance level: Independent with assistive device   Problem: RH Toilet  Transfers Goal: LTG Patient will perform toilet transfers w/assist (OT) Description: LTG: Patient will perform toilet transfers with assist, with/without cues using equipment (OT) Flowsheets (Taken 07/21/2023 1238) LTG: Pt will perform toilet transfers with assistance level of: Independent with assistive device   Problem: RH Tub/Shower Transfers Goal: LTG Patient will perform tub/shower transfers w/assist (OT) Description: LTG: Patient will perform tub/shower transfers with assist, with/without cues using equipment (OT) Flowsheets (Taken 07/21/2023 1238) LTG: Pt will perform tub/shower stall transfers with assistance level of: Independent with assistive device

## 2023-07-21 NOTE — Progress Notes (Signed)
 PROGRESS NOTE   Subjective/Complaints:  Pt reports pain doing better since on Meds including Prednisone - makes a big difference.  Using ICS/flutter valve every 1-2 hours during the day 13 and 10 uses every 1 hour usually.  Used O2 at home, but prn- here on 3-4 L usually. Said took Prednisone  5-10 mg daily at home even prior to fx- for pain control!  Said wasn't on Prednisone  for Pneumonitis, was on for severe pain control after compression fx.  Just finished 20 mg daily today and decrease to 10 mg til Saturday, then back to home dose of 5 mg  LBM yesterday0  LLE pain is where now has pain, not back  ROS:  Pt denies SOB, abd pain, CP, N/V/C/D, and vision changes  Objective:   No results found. Recent Labs    07/20/23 0821 07/21/23 0549  WBC 8.2 5.3  HGB 13.4 11.7*  HCT 42.1 34.7*  PLT 241 172   Recent Labs    07/20/23 0821 07/21/23 0549  NA 137 137  K 3.3* 3.4*  CL 102 103  CO2 26 27  GLUCOSE 101* 95  BUN 15 15  CREATININE 0.94 1.05  CALCIUM  8.6* 8.5*    Intake/Output Summary (Last 24 hours) at 07/21/2023 1928 Last data filed at 07/21/2023 1851 Gross per 24 hour  Intake 1160 ml  Output 1900 ml  Net -740 ml        Physical Exam: Vital Signs Blood pressure 103/74, pulse 75, temperature 97.9 F (36.6 C), resp. rate 18, height 5\' 11"  (1.803 m), weight 103.5 kg, SpO2 95%.   General: awake, alert, appropriate, sitting up in bed;  NAD HENT: conjugate gaze; oropharynx moist CV: regular rate and rhythm; no JVD Pulmonary: CTA B/L; no W/R/R- good air movement- decreased at bases- 4L O2 by [place] in place currently GI: soft, NT, ND, (+)BS Psychiatric: appropriate- very interactive Neurological: Ox3 Neurological:     Comments: Patient is alert and oriented x 3.  Follows full commands. Strength is 5/5 except for LLE: 2/5 HF, 2/5 KE, 4/5 DF and PF  Assessment/Plan: 1. Functional deficits which require 3+ hours  per day of interdisciplinary therapy in a comprehensive inpatient rehab setting. Physiatrist is providing close team supervision and 24 hour management of active medical problems listed below. Physiatrist and rehab team continue to assess barriers to discharge/monitor patient progress toward functional and medical goals  Care Tool:  Bathing              Bathing assist Assist Level: Contact Guard/Touching assist     Upper Body Dressing/Undressing Upper body dressing   What is the patient wearing?: Pull over shirt    Upper body assist Assist Level: Supervision/Verbal cueing    Lower Body Dressing/Undressing Lower body dressing      What is the patient wearing?: Pants, Underwear/pull up     Lower body assist Assist for lower body dressing: Minimal Assistance - Patient > 75%     Toileting Toileting    Toileting assist Assist for toileting: Contact Guard/Touching assist     Transfers Chair/bed transfer  Transfers assist  Chair/bed transfer activity did not occur: Safety/medical concerns  Chair/bed transfer assist level:  Minimal Assistance - Patient > 75%     Locomotion Ambulation   Ambulation assist        Assistive device: Walker-rolling Max distance: 40   Walk 10 feet activity   Assist     Assist level: 2 helpers (+2 for w/c follow, min A)     Walk 50 feet activity   Assist Walk 50 feet with 2 turns activity did not occur: Safety/medical concerns (endurance deficit)         Walk 150 feet activity   Assist Walk 150 feet activity did not occur: Safety/medical concerns         Walk 10 feet on uneven surface  activity   Assist Walk 10 feet on uneven surfaces activity did not occur: Safety/medical concerns         Wheelchair     Assist Is the patient using a wheelchair?: Yes Type of Wheelchair: Manual    Wheelchair assist level: Dependent - Patient 0% Max wheelchair distance: 150 ft    Wheelchair 50 feet with 2 turns  activity    Assist        Assist Level: Dependent - Patient 0%   Wheelchair 150 feet activity     Assist      Assist Level: Dependent - Patient 0%   Blood pressure 103/74, pulse 75, temperature 97.9 F (36.6 C), resp. rate 18, height 5\' 11"  (1.803 m), weight 103.5 kg, SpO2 95%.  Medical Problem List and Plan: 1. Functional deficits secondary to left lumbar radiculopathy status post L2 compression fracture with kyphoplasty 07/08/2023             -patient may shower             -ELOS/Goals: 7-10 days modI             Admit to CIR   First day of evaluations- Con't CIR PT and OT 2.  Antithrombotics: -DVT/anticoagulation:  Pharmaceutical: Eliquis               3. Pain Management: continue Duragesic  patch, Lidoderm  patch, Robaxin  1000 mg every 6 hours as needed, Dilaudid  3 mg every 4 hours as needed   6/5- pt reports pain better controlled, but still taking pain meds frequently- if cannot get improvement in pain in the next few days, will increase Patch and decrease Dilaudid  frequency- very hard to get outpt 4. Mood/Behavior/Sleep: Paxil  10 mg nightly             -antipsychotic agents: N/A   5. Neuropsych/cognition: This patient is capable of making decisions on his own behalf.   6. Skin/Wound Care: Routine skin checks 7. Fluids/Electrolytes/Nutrition: Routine in and outs with follow-up chemistries   8.  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  Followed outpatient by Dr. Maria Shiner   9.  Chronic hypoxic respiratory failure/COPD/remote tobacco use.  Patient on 3 L oxygen nasal cannula prior to admission.  Maintained on prednisone  therapy   6/5- pt reports only used O2 prn unless in pain/SOB- but on 4L this AM 10.  Drug-induced neutropenia.  Follow-up hematology services   6/5- WBC 5.3 and Plts 172- will con't to follow 11.  PAF: continue Amiodarone  200 mg daily, Toprol  25 mg daily.  Cardiac rate controlled.  Follow  up cardiology services   6/5-  appeared in RRR this AM 12.  GERD.  Continue Protonix    13.  Pneumonitis: continue prednisone    6/5- weaning prednisone  14. Hypokalemia  6/5- will replet K+  for K+ of 3.4- with 40 MEq x1   I spent a total of  38   minutes on total care today- >50% coordination of care- due to  D/w pt, review of chart, labs, and vitals- as well as plan for prednisone  wean and plan for pain meds  LOS: 1 days A FACE TO FACE EVALUATION WAS PERFORMED  Alka Falwell 07/21/2023, 7:28 PM

## 2023-07-21 NOTE — Progress Notes (Signed)
 Inpatient Rehabilitation Admission Medication Review by a Pharmacist  A complete drug regimen review was completed for this patient to identify any potential clinically significant medication issues.  High Risk Drug Classes Is patient taking? Indication by Medication  Antipsychotic No   Anticoagulant Yes Apixaban  -h/o Afib  Antibiotic No   Opioid Yes Fentanyl  patch, dilaudid  - pain management  Antiplatelet No   Hypoglycemics/insulin No   Vasoactive Medication Yes Amiodarone , metoprolol  -PAF  Chemotherapy No   Other Yes Acetaminophen , lidocaine  patch-pain management  Benzonatate  - cough Breztri , albuterol - COPD with asthma Pantoprazole  - GERD Paroxetine  - mood/chronic anxiety/sleep Prednisone - pneumonitis Methocarbamol - muscle spasms Ondansetron - N/V Sodium chloride  - sodium supplement Senokot S, miralax  = bowel regimen Vitamin D  -supplement     Type of Medication Issue Identified Description of Issue Recommendation(s)  Drug Interaction(s) (clinically significant)     Duplicate Therapy     Allergy     No Medication Administration End Date     Incorrect Dose     Additional Drug Therapy Needed     Significant med changes from prior encounter (inform family/care partners about these prior to discharge). PTA medication:  Percocet was discontinued on [facility] acute discharge. Communicate to patient /family/ caregiver prior to discharge.  Other       Clinically significant medication issues were identified that warrant physician communication and completion of prescribed/recommended actions by midnight of the next day:  No  Name of provider notified for urgent issues identified:   Provider Method of Notification:     Pharmacist comments:   Time spent performing this drug regimen review (minutes):  25   Alisa Irish, RPh Clinical Pharmacist 07/21/2023 11:37 AM

## 2023-07-22 ENCOUNTER — Inpatient Hospital Stay

## 2023-07-22 DIAGNOSIS — M5416 Radiculopathy, lumbar region: Secondary | ICD-10-CM | POA: Diagnosis not present

## 2023-07-22 NOTE — Progress Notes (Signed)
 Physical Therapy Session Note  Patient Details  Name: Vin Yonke MRN: [id number] Date of Birth: [birth year]  Today's Date: 07/22/2023 PT Individual Time: 1045-1130 PT Individual Time Calculation (min): 45 min   Short Term Goals: Week 1:  PT Short Term Goal 1 (Week 1): = LTG  Skilled Therapeutic Interventions/Progress Updates: Pt presents sitting in recliner w/ LES elevated and nursing present monitoring O2 sats.  Nursing states PA recommending O2 remains > 86%.  PT educated pt on monitoring his fatigue levels w/ therapy in order to prevent drops in O2 sats, seconding RN ed w/ same.  Pt agreeable to therapy in room.  Pt performed LE there ex w/ frequent rest breaks 2/2 drops in sats to 84-5% but then increases to 89%.  Pt performed calf raises 3 x 15, LAQ 2 x 10 R > L, and hip flexion 3 x 10, AAROM to LLE.  Pt transfers sit to stand w/ CGA and cues for breathing techniques.  Pt able to stand x 1 1/2 min each trial x 2 before requesting to sit.  O2 sats decreased to 79% and recovered w/in 2' w/ LES elevated.  Pt remained sitting in recliner w/ LES elevated and seat alarm on, all needs in reach.     Therapy Documentation Precautions:  Precautions Precautions: Back, Fall Precaution Booklet Issued: Yes (comment) Recall of Precautions/Restrictions: Intact Precaution/Restrictions Comments: educated on precautions, able to state 3/3 Restrictions Weight Bearing Restrictions Per Provider Order: No General:   Vital Signs:   Pain:0/10 Pain Assessment Pain Score: 5     Therapy/Group: Individual Therapy  Corbyn Steedman P Jay Kempe 07/22/2023, 11:32 AM

## 2023-07-22 NOTE — Progress Notes (Signed)
 Occupational Therapy Session Note  Patient Details  Name: John [name] MRN: [id number] Date of Birth: Nov 22, 1960  Today's Date: 07/22/2023 OT Individual Time: 0865-7846 OT Individual Time Calculation (min): 59 min    Short Term Goals: Week 1:  OT Short Term Goal 1 (Week 1): STG=LTG d/t ELOS  Skilled Therapeutic Interventions/Progress Updates:  Pt greeted sitting in recliner for skilled OT session with focus on functional transfers and toileting, alongside O2 management. Pt received on 4L O2 [place], titrated to 6L O2 to assist with recovery, pt remained on 4L O2.   Pain: Pt with no reports of pain. OT offering intermediate rest breaks and positioning suggestions throughout session to address pain/fatigue and maximize participation/safety in session.   Functional Transfers: Sit<>stands and ambulatory toilet/walk-in shower transfer with supervision (A for O2 management) + RW. Sit>supine with A for BLE elevation into bed.  Self Care Tasks: Pt completes toileting with supervision + RW. Extensive time dedicated to monitoring O2, as original session plan was to take a shower, but patient desatting to 72-73 spo2 post void, pt requires extensive time to recover even when placed on 6L O2 [place]. Pt uses reacher to unthread LB garments, but Max A for threading clean garments due to SOB/exertion. Treatment team updated on session performance.   Pt remained resting in bed, O2 within parameters at 4L O2 [place], 4Ps assessed and immediate needs met. Pt continues to be appropriate for skilled OT intervention to promote further functional independence in ADLs/IADLs.   Therapy Documentation Precautions:  Precautions Precautions: Back, Fall Precaution Booklet Issued: Yes (comment) Recall of Precautions/Restrictions: Intact Precaution/Restrictions Comments: educated on precautions, able to state 3/3 Restrictions Weight Bearing Restrictions Per Provider Order: No   Therapy/Group: Individual Therapy  Artemus Biles, OTR/L, MSOT  07/22/2023, 6:32 AM

## 2023-07-22 NOTE — Progress Notes (Signed)
 Met with patient to review current situation, team conference and plan of care. Reviewed current medications, nutrition, bowel, bladder, sleep, pain. Patient has no further questions. Continue to follow along to provide educational needs to facilitate preparation for discharge.

## 2023-07-22 NOTE — Progress Notes (Signed)
 PROGRESS NOTE   Subjective/Complaints:  Pt reports still having pain in LLE, but is a little blunted- also reports very sore, esp in abdomen from therapy- most exercise he's done in months.   Breathing OK- no issues on O2.  LBM yesterday   ROS:  Pt denies SOB, abd pain, CP, N/V/C/D, and vision changes  Objective:   No results found. Recent Labs    07/20/23 0821 07/21/23 0549  WBC 8.2 5.3  HGB 13.4 11.7*  HCT 42.1 34.7*  PLT 241 172   Recent Labs    07/20/23 0821 07/21/23 0549  NA 137 137  K 3.3* 3.4*  CL 102 103  CO2 26 27  GLUCOSE 101* 95  BUN 15 15  CREATININE 0.94 1.05  CALCIUM  8.6* 8.5*    Intake/Output Summary (Last 24 hours) at 07/22/2023 0833 Last data filed at 07/22/2023 0720 Gross per 24 hour  Intake 1316 ml  Output 2000 ml  Net -684 ml        Physical Exam: Vital Signs Blood pressure 104/72, pulse 69, temperature 98.3 F (36.8 C), temperature source Oral, resp. rate 18, height 5\' 11"  (1.803 m), weight 103.5 kg, SpO2 (!) 89%.    General: awake, alert, appropriate, sitting up somewhat in bed; NAD HENT: conjugate gaze; oropharynx moist; O2 via [place] 4L CV: regular rate and rhythm; no JVD Pulmonary: decreased at bases; No W/R/R GI: soft, NT, ND, (+)BS- more normoactive Psychiatric: appropriate -interactive Neurological: Ox3  Neurological:     Comments: Patient is alert and oriented x 3.  Follows full commands. Strength is 5/5 except for LLE: 2/5 HF, 2/5 KE, 4/5 DF and PF  Assessment/Plan: 1. Functional deficits which require 3+ hours per day of interdisciplinary therapy in a comprehensive inpatient rehab setting. Physiatrist is providing close team supervision and 24 hour management of active medical problems listed below. Physiatrist and rehab team continue to assess barriers to discharge/monitor patient progress toward functional and medical goals  Care Tool:  Bathing               Bathing assist Assist Level: Contact Guard/Touching assist     Upper Body Dressing/Undressing Upper body dressing   What is the patient wearing?: Pull over shirt    Upper body assist Assist Level: Supervision/Verbal cueing    Lower Body Dressing/Undressing Lower body dressing      What is the patient wearing?: Pants, Underwear/pull up     Lower body assist Assist for lower body dressing: Minimal Assistance - Patient > 75%     Toileting Toileting    Toileting assist Assist for toileting: Contact Guard/Touching assist     Transfers Chair/bed transfer  Transfers assist  Chair/bed transfer activity did not occur: Safety/medical concerns  Chair/bed transfer assist level: Minimal Assistance - Patient > 75%     Locomotion Ambulation   Ambulation assist        Assistive device: Walker-rolling Max distance: 40   Walk 10 feet activity   Assist     Assist level: 2 helpers (+2 for w/c follow, min A)     Walk 50 feet activity   Assist Walk 50 feet with 2 turns activity did not  occur: Safety/medical concerns (endurance deficit)         Walk 150 feet activity   Assist Walk 150 feet activity did not occur: Safety/medical concerns         Walk 10 feet on uneven surface  activity   Assist Walk 10 feet on uneven surfaces activity did not occur: Safety/medical concerns         Wheelchair     Assist Is the patient using a wheelchair?: Yes Type of Wheelchair: Manual    Wheelchair assist level: Dependent - Patient 0% Max wheelchair distance: 150 ft    Wheelchair 50 feet with 2 turns activity    Assist        Assist Level: Dependent - Patient 0%   Wheelchair 150 feet activity     Assist      Assist Level: Dependent - Patient 0%   Blood pressure 104/72, pulse 69, temperature 98.3 F (36.8 C), temperature source Oral, resp. rate 18, height 5\' 11"  (1.803 m), weight 103.5 kg, SpO2 (!) 89%.  Medical Problem List and  Plan: 1. Functional deficits secondary to left lumbar radiculopathy status post L2 compression fracture with kyphoplasty 07/08/2023             -patient may shower             -ELOS/Goals: 7-10 days modI             Admit to CIR   Con't CIR PT and OT  IPOC today 2.  Antithrombotics: -DVT/anticoagulation:  Pharmaceutical: Eliquis               3. Pain Management: continue Duragesic  patch, Lidoderm  patch, Robaxin  1000 mg every 6 hours as needed, Dilaudid  3 mg every 4 hours as needed   6/5- pt reports pain better controlled, but still taking pain meds frequently- if cannot get improvement in pain in the next few days, will increase Patch and decrease Dilaudid  frequency- very hard to get outpt  6/6- pain doinga little better- will d/w pt Monday about nerve pain meds 4. Mood/Behavior/Sleep: Paxil  10 mg nightly             -antipsychotic agents: N/A   5. Neuropsych/cognition: This patient is capable of making decisions on his own behalf.   6. Skin/Wound Care: Routine skin checks 7. Fluids/Electrolytes/Nutrition: Routine in and outs with follow-up chemistries   8.  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  Followed outpatient by Dr. Maria Shiner   9.  Chronic hypoxic respiratory failure/COPD/remote tobacco use.  Patient on 3 L oxygen nasal cannula prior to admission.  Maintained on prednisone  therapy   6/5- pt reports only used O2 prn unless in pain/SOB- but on 4L this AM 10.  Drug-induced neutropenia.  Follow-up hematology services   6/5- WBC 5.3 and Plts 172- will con't to follow 11.  PAF: continue Amiodarone  200 mg daily, Toprol  25 mg daily.  Cardiac rate controlled.  Follow  up cardiology services   6/5-6/6-  appeared in RRR this AM 12.  GERD.  Continue Protonix    13.  Pneumonitis: continue prednisone    6/5- weaning prednisone  14. Hypokalemia  6/5- will replet K+ for K+ of 3.4- with 40 MEq x1   I spent a total of  35  minutes on total care today- >50%  coordination of care- due to  D/w pt as well as nursing how did overnight- and O2/pain as well as IPOC  LOS: 2 days A FACE TO FACE EVALUATION  WAS PERFORMED  Agness Sibrian 07/22/2023, 8:33 AM

## 2023-07-22 NOTE — Progress Notes (Signed)
 Occupational Therapy Session Note  Patient Details  Name: [name] MRN: [id number] Date of Birth: Feb 12, 1961  Today's Date: 07/22/2023 OT Individual Time: 1348-1430 OT Individual Time Calculation (min): 42 min    Short Term Goals: Week 1:  OT Short Term Goal 1 (Week 1): STG=LTG d/t ELOS  Skilled Therapeutic Interventions/Progress Updates:    Pt received sitting in the w/c with no c/o pain. He provided OT with lengthy overview of breathing difficulties today and previous response to session. He reported ongoing fatigue and was worried about participation. OT presented idea of going outside and completing UE therex and he was agreeable.  Pt completed BUE therex with level 3 resistance band. Completed to challenge BUE strength and endurance required for maximal independence with ADLs and ADL transfers. Pt completed bicep curls, tricep extension, and lateral fly with scapular retraction. Cueing required to ensure proper form and technique for proper muscle activation, as well as cueing for breathing technique. 3x10 repetitions. Pt on 4L O2 throughout session and remaining around ~89-90% which he states is near baseline. Pt returned inside and was left sitting in the recliner up with all needs met.    Therapy Documentation Precautions:  Precautions Precautions: Back, Fall Precaution Booklet Issued: Yes (comment) Recall of Precautions/Restrictions: Intact Precaution/Restrictions Comments: educated on precautions, able to state 3/3 Restrictions Weight Bearing Restrictions Per Provider Order: No  Therapy/Group: Individual Therapy  Una Ganser 07/22/2023, 6:26 AM

## 2023-07-22 NOTE — Progress Notes (Signed)
 1030- O2 sats dropped to low 80s during PT session. Pt ambulated further today than he had in over a month. Pt was then assisted back to room was able to recover to upper 80s. Dan, PA in room to assess.   1050- reinforced education on breathing exercises and pacing self during sessions. Encouraged patient to ask for rest breaks as needed.   Starlyn Economy, RN

## 2023-07-22 NOTE — IPOC Note (Signed)
 Overall Plan of Care [facility]) Patient Details Name: John [name] MRN: [id number] DOB: 1960-03-11  Admitting Diagnosis: Lumbar radiculopathy  Hospital Problems: Principal Problem:   Lumbar radiculopathy     Functional Problem List: Nursing Endurance, Medication Management, Motor, Pain  PT Endurance, Balance, Safety, Motor, Pain, Skin Integrity  OT Balance, Endurance, Safety  SLP    TR         Basic ADL's: OT Eating, Grooming, Bathing, Dressing, Toileting     Advanced  ADL's: OT       Transfers: PT Bed Mobility, Bed to Chair, Car, [employer], Floor  OT Toilet, Research scientist (life sciences): PT Ambulation, Psychologist, prison and probation services, Stairs     Additional Impairments: OT None  SLP        TR      Anticipated Outcomes Item Anticipated Outcome  Self Feeding mod I  Swallowing      Basic self-care  mod I  Toileting  mod I   Bathroom Transfers mod I  Bowel/Bladder  Remain continent  Transfers  mod I / Supervision (for O2 management)  Locomotion  mod I / Supervision (for O2 management) ambulatory  Communication     Cognition     Pain  <3  Safety/Judgment  Mod I/Supervision   Therapy Plan: PT Intensity: Minimum of 1-2 x/day ,45 to 90 minutes PT Frequency: 5 out of 7 days PT Duration Estimated Length of Stay: 7-10 days OT Intensity: Minimum of 1-2 x/day, 45 to 90 minutes OT Frequency: 5 out of 7 days OT Duration/Estimated Length of Stay: 7-10 days     Team Interventions: Nursing Interventions Patient/Family Education, Disease Management/Prevention, Pain Management, Medication Management, Discharge Planning  PT interventions Ambulation/gait training, DME/adaptive equipment instruction, Neuromuscular re-education, UE/LE Strength taining/ROM, Wheelchair propulsion/positioning, Therapeutic Activities, Skin care/wound management, Pain management, Discharge planning, Functional mobility training, Patient/family education, Therapeutic Exercise, Designer, jewellery, Community reintegration, Disease management/prevention, Psychosocial support, Museum/gallery curator, Splinting/orthotics  OT Interventions Warden/ranger, Discharge planning, Pain management, Self Care/advanced ADL retraining, Therapeutic Activities, UE/LE Coordination activities, Disease mangement/prevention, Functional mobility training, Patient/family education, Skin care/wound managment, Therapeutic Exercise, Community reintegration, Fish farm manager, Neuromuscular re-education, Psychosocial support, Splinting/orthotics, UE/LE Strength taining/ROM, Wheelchair propulsion/positioning  SLP Interventions    TR Interventions    SW/CM Interventions Discharge Planning, Psychosocial Support, Patient/Family Education   Barriers to Discharge MD  Medical stability, Home enviroment access/loayout, Wound care, Lack of/limited family support, Weight, Weight bearing restrictions, and New oxygen  Nursing Decreased caregiver support 1 level, ramped entrance, spouse available to assist at discharge.  PT      OT      SLP      SW Decreased caregiver support, Insurance for SNF coverage     Team Discharge Planning: Destination: PT-Home ,OT- Home , SLP-  Projected Follow-up: PT-Home health PT, OT-  Outpatient OT, SLP-  Projected Equipment Needs: PT-None recommended by PT, OT- To be determined, SLP-  Equipment Details: PT-pt has w/c and walker, OT-owns shower chair and RW Patient/family involved in discharge planning: PT- Patient,  OT-Patient, SLP-   MD ELOS: 7-10 days  Medical Rehab Prognosis:  Excellent Assessment: The patient has been admitted for CIR therapies with the diagnosis of Lumbar compression fx, pneumonitis. The team will be addressing functional mobility, strength, stamina, balance, safety, adaptive techniques and equipment, self-care, bowel and bladder mgt, patient and caregiver education, . Goals have been set at mod I. Anticipated discharge destination is  home.        See Team Conference Notes for  weekly updates to the plan of care

## 2023-07-22 NOTE — Progress Notes (Signed)
 Physical Therapy Session Note  Patient Details  Name: John [name] MRN: [id number] Date of Birth: January 24, 1961  Today's Date: 07/22/2023 PT Individual Time: 4098-1191 PT Individual Time Calculation (min): 58 min   Short Term Goals: Week 1:  PT Short Term Goal 1 (Week 1): = LTG  Skilled Therapeutic Interventions/Progress Updates:    Pt lying in bed when PT arrived. Pt willing to participate with PT.  Pt reports he is glad to get OOB and into w/c, reports his left leg/thigh muscles feel sore and wasn't sure why, but figured he was doing a lot more activity. PT explained the lack of activity, atrophy of his mm will take time to regain strength. Pt is motivated to recover.  Pt able to transfer supine->sit with set up.  Pt immediately became dizzy and unable to sustain upright posture and needed RW for UE support. SPO2 92%.   Pt willing to ambulate to gym with RW, W/C behind x95 ft. Once in gym, pt's O2 sats decreased to 83% on 4L/min, PT increased O2 level to 6L/min due to exertion. Pt c/o feeling dizzy.  BP 105/78 LUE, HR 89.  Pt recovered to 88% shortly after and was able to stand with RW for x10 heel raises, seated LAQ with .75# ankle weights x10.  O2 dropped and fluctuated between 83-86%. PT instructed on proper breathing techniques.  PT notified RN regarding O2 readings and was told he can be at 86%.  By end of treatment, pt normalized to 89-89%.  Pt was taken back to room via w/c and transferred to recliner with legs elevated, returned O2 back to 4L/min.   Therapy Documentation Precautions:  Precautions Precautions: Back, Fall Precaution Booklet Issued: Yes (comment) Recall of Precautions/Restrictions: Intact Precaution/Restrictions Comments: educated on precautions, able to state 3/3 Restrictions Weight Bearing Restrictions Per Provider Order: No    VPain: Pain Assessment Pain Scale: 0-10 Pain Score: 0 Pain Location:  Pain Intervention(s): Medication (See eMAR)   Therapy/Group:  Individual Therapy  [name] 07/22/2023, 4:05 PM

## 2023-07-23 DIAGNOSIS — M5416 Radiculopathy, lumbar region: Secondary | ICD-10-CM | POA: Diagnosis not present

## 2023-07-23 MED ORDER — METOPROLOL SUCCINATE ER 25 MG PO TB24
12.5000 mg | ORAL_TABLET | Freq: Every day | ORAL | Status: DC
  Administered 2023-07-23 – 2023-07-29 (×4): 12.5 mg via ORAL
  Filled 2023-07-23 (×7): qty 1

## 2023-07-23 NOTE — Plan of Care (Signed)
  Problem: Consults Goal: RH GENERAL PATIENT EDUCATION Description: See Patient Education module for education specifics. Outcome: Progressing   Problem: RH SKIN INTEGRITY Goal: RH STG MAINTAIN SKIN INTEGRITY WITH ASSISTANCE Description: STG Maintain Skin Integrity With Supervision Assistance. Outcome: Progressing   Problem: RH SAFETY Goal: RH STG ADHERE TO SAFETY PRECAUTIONS W/ASSISTANCE/DEVICE Description: STG Adhere to Safety Precautions With Supervision Assistance/Device. Outcome: Progressing   Problem: RH PAIN MANAGEMENT Goal: RH STG PAIN MANAGED AT OR BELOW PT'S PAIN GOAL Description: < 3 on a 0-10 pain scale. Outcome: Progressing   Problem: RH KNOWLEDGE DEFICIT GENERAL Goal: RH STG INCREASE KNOWLEDGE OF SELF CARE AFTER HOSPITALIZATION Description: Patient will demonstrate knowledge of pain management, and medication management with educational materials and handouts provided by staff during Rehab stay. Outcome: Progressing

## 2023-07-23 NOTE — Progress Notes (Signed)
+/-   sleep, patient thinks prednisone  is keeping him awake. O2 at 4L/M via [place]. SOB with minimal activity. Using IS, able to get up to 1000. Using flutter valve. Breath sounds diminished. Continent of [employer]. LBM 06/06.at 2113, PRN dilaudid  given for left hip pain. BLE edema. Carmelina Balducci A

## 2023-07-23 NOTE — Progress Notes (Signed)
 It sounds like things are going pretty well with his rehabilitation.  He did have some problems with low oxygen saturations yesterday.  Hopefully this has been taken care of.  He is using the incentive spirometer.  Hopefully, as he becomes more active, he will be able to get up more and that should open up his lungs.  He is eating better.  He is going to the bathroom.  He is not having much pain.  He is having better strength in the left leg.  I am happy that things are improving for him.  He is certainly motivated.  We probably have to check labs on Monday.  His vital signs are temperature 98.7.  Pulse 72.  Blood pressure 98/63.  His lungs sound pretty clear bilaterally.  He has good air movement bilaterally.  Cardiac exam shows a regular rate but rhythm is irregular consistent with the atrial fibrillation.  Abdomen soft.  Bowel sounds are present.  There is no guarding or rebound tenderness.  Extremities shows no clubbing, cyanosis or edema.  Neurological exam is nonfocal.  John [name] is on the CIR.  I know he is working hard.  He has some problems with low O2 saturation yesterday.  The low oxygen saturation continues, he may need to have a chest x-ray.  I know he is limited reluctant to use nebulizers.  He is worried about the atrial fibrillation.  However, there is certainly or nebulizers that will not would not affect the atrial fibrillation.  I do appreciate everyone's help on the inpatient rehab unit.  Everybody is working really hard to get John [name] to have a better quality of life.  Rayleen Cal, MD  1 Cor 16:13

## 2023-07-23 NOTE — Progress Notes (Signed)
 PROGRESS NOTE   Subjective/Complaints:    Breathing OK- no issues on O2.  LBM yesterday   ROS:  Pt denies SOB, abd pain, CP, N/V/C/D, and vision changes  Objective:   No results found. Recent Labs    07/21/23 0549  WBC 5.3  HGB 11.7*  HCT 34.7*  PLT 172   Recent Labs    07/21/23 0549  NA 137  K 3.4*  CL 103  CO2 27  GLUCOSE 95  BUN 15  CREATININE 1.05  CALCIUM  8.5*    Intake/Output Summary (Last 24 hours) at 07/23/2023 1148 Last data filed at 07/23/2023 0717 Gross per 24 hour  Intake 1430 ml  Output 1425 ml  Net 5 ml        Physical Exam: Vital Signs Blood pressure 91/66, pulse 72, temperature 98.2 F (36.8 C), temperature source Oral, resp. rate 18, height 5\' 11"  (1.803 m), weight 103.5 kg, SpO2 99%.   General: No acute distress Mood and affect are appropriate Heart: Regular rate and rhythm no rubs murmurs or extra sounds Lungs: Clear to auscultation, breathing unlabored, no rales or wheezes Abdomen: Positive bowel sounds, soft nontender to palpation, nondistended Extremities: No clubbing, cyanosis, or edema Skin: No evidence of breakdown, no evidence of rash    Neurological:     Comments: Patient is alert and oriented x 3.  Follows full commands. Strength is 5/5 except for LLE: 2/5 HF, 4/5 KE , 4/5 DF and PF  Assessment/Plan: 1. Functional deficits which require 3+ hours per day of interdisciplinary therapy in a comprehensive inpatient rehab setting. Physiatrist is providing close team supervision and 24 hour management of active medical problems listed below. Physiatrist and rehab team continue to assess barriers to discharge/monitor patient progress toward functional and medical goals  Care Tool:  Bathing              Bathing assist Assist Level: Contact Guard/Touching assist     Upper Body Dressing/Undressing Upper body dressing   What is the patient wearing?: Pull over  shirt    Upper body assist Assist Level: Supervision/Verbal cueing    Lower Body Dressing/Undressing Lower body dressing      What is the patient wearing?: Pants, Underwear/pull up     Lower body assist Assist for lower body dressing: Minimal Assistance - Patient > 75%     Toileting Toileting    Toileting assist Assist for toileting: Contact Guard/Touching assist     Transfers Chair/bed transfer  Transfers assist  Chair/bed transfer activity did not occur: Safety/medical concerns  Chair/bed transfer assist level: Set up assist     Locomotion Ambulation   Ambulation assist      Assist level: Contact Guard/Touching assist Assistive device: Walker-rolling Max distance: 95 ft   Walk 10 feet activity   Assist     Assist level: Contact Guard/Touching assist Assistive device: Walker-rolling   Walk 50 feet activity   Assist Walk 50 feet with 2 turns activity did not occur: Safety/medical concerns (endurance deficit)  Assist level: Contact Guard/Touching assist Assistive device: Walker-rolling    Walk 150 feet activity   Assist Walk 150 feet activity did not occur: Safety/medical concerns  Walk 10 feet on uneven surface  activity   Assist Walk 10 feet on uneven surfaces activity did not occur: Safety/medical concerns         Wheelchair     Assist Is the patient using a wheelchair?: Yes Type of Wheelchair: Manual Wheelchair activity did not occur: Safety/medical concerns  Wheelchair assist level: Dependent - Patient 0% Max wheelchair distance: x95 ft    Wheelchair 50 feet with 2 turns activity    Assist    Wheelchair 50 feet with 2 turns activity did not occur: Safety/medical concerns   Assist Level: Dependent - Patient 0%   Wheelchair 150 feet activity     Assist  Wheelchair 150 feet activity did not occur: Safety/medical concerns   Assist Level: Dependent - Patient 0%   Blood pressure 91/66, pulse 72,  temperature 98.2 F (36.8 C), temperature source Oral, resp. rate 18, height 5\' 11"  (1.803 m), weight 103.5 kg, SpO2 99%.  Medical Problem List and Plan: 1. Functional deficits secondary to left L2 lumbar radiculopathy status post L2 compression fracture with kyphoplasty 07/08/2023             -patient may shower             -ELOS/Goals: 7-10 days modI             Admit to CIR   Con't CIR PT and OT  2.  Antithrombotics: -DVT/anticoagulation:  Pharmaceutical: Eliquis               3. Pain Management: continue Duragesic  patch, Lidoderm  patch, Robaxin  1000 mg every 6 hours as needed, Dilaudid  3 mg every 4 hours as needed   6/5- pt reports pain better controlled, but still taking pain meds frequently- if cannot get improvement in pain in the next few days, will increase Patch and decrease Dilaudid  frequency- very hard to get outpt  6/6- pain doinga little better- will d/w pt Monday about nerve pain meds 4. Mood/Behavior/Sleep: Paxil  10 mg nightly             -antipsychotic agents: N/A   5. Neuropsych/cognition: This patient is capable of making decisions on his own behalf.   6. Skin/Wound Care: Routine skin checks 7. Fluids/Electrolytes/Nutrition: Routine in and outs with follow-up chemistries   8.  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  Followed outpatient by Dr. Maria Shiner   9.  Chronic hypoxic respiratory failure/COPD/remote tobacco use.  Patient on 3 L oxygen nasal cannula prior to admission.  Maintained on prednisone  therapy   6/5- pt reports only used O2 prn unless in pain/SOB- but on 4L this AM 10.  Drug-induced neutropenia.  Follow-up hematology services   6/5- WBC 5.3 and Plts 172- will con't to follow 11.  PAF: continue Amiodarone  200 mg daily, Toprol  25 mg daily.  Cardiac rate controlled.  Follow  up cardiology services   6/5-6/6-  appeared in RRR this AM 12.  GERD.  Continue Protonix    13.  Pneumonitis: continue prednisone    6/5- weaning  prednisone  14. Hypokalemia  6/5- will replet K+ for K+ of 3.4- with 40 MEq x1    Latest Ref Rng & Units 07/21/2023    5:49 AM 07/20/2023    8:21 AM 07/16/2023    4:26 AM  BMP  Glucose 70 - 99 mg/dL 95  161  98   BUN 8 - 23 mg/dL 15  15  19    Creatinine 0.61 - 1.24 mg/dL 0.96  0.45  4.09  Sodium 135 - 145 mmol/L 137  137  133   Potassium 3.5 - 5.1 mmol/L 3.4  3.3  3.4   Chloride 98 - 111 mmol/L 103  102  101   CO2 22 - 32 mmol/L 27  26  26    Calcium  8.9 - 10.3 mg/dL 8.5  8.6  8.3      LOS: 3 days A FACE TO FACE EVALUATION WAS PERFORMED  John [name] 07/23/2023, 11:48 AM

## 2023-07-23 NOTE — Progress Notes (Signed)
 Occupational Therapy Session Note  Patient Details  Name: John [name] MRN: [id number] Date of Birth: March 03, 1960  Today's Date: 07/23/2023 OT Individual Time: 7829-5621 OT Individual Time Calculation (min): 51 min    Short Term Goals: Week 1:  OT Short Term Goal 1 (Week 1): STG=LTG d/t ELOS  Skilled Therapeutic Interventions/Progress Updates:  Pt greeted resting in bed for skilled OT session with focus on functional transfers, toileting, and progression of AD. Pt received/maintained on 4L O2 HF [place] even with activity.   Pain: Pt with no reports of pain. OT offering intermediate rest breaks and positioning suggestions throughout session to address potential pain/fatigue and maximize participation/safety in session.   Functional Transfers: Bed mobility with HOB elevated and supervision. Sit<>stands and ambulatory transfers in similar fashion with assistance for O2 tank management.   Self Care Tasks: Pt completes the following self care tasks with levels of assistance noted below, Toileting with distant supervision, continent of BM void.   Therapeutic Activities: Pt completes x3 bouts of mobility to target general conditioning/activity tolerance. Rollator introduced as a mean of energy conservation. Pt mobilizes at supervision level, requiring intermediate cuing to lock brakes due to newness of AD. Patient does report "I'm riding the brakes", endorsing mild fear over increased gait speed. Plan to continue assessing.   Therapeutic Exercise: Pt performs 2x3min circuits of Nustep modality for general strengthening. Pt able to recover with extended rest-breaks, dropping to low 80s but maintained on 4L.    Education: Reviewed monitoring level of exertion using "gas tank" and "stop-light" analogies. Pt receptive to need for seated rest break "when tank is 1/2 to 3/4 empty" or "when the light is turning yellow."   Pt remained sitting in recliner with 4Ps assessed and immediate needs met. Pt  continues to be appropriate for skilled OT intervention to promote further functional independence in ADLs/IADLs.   Therapy Documentation Precautions:  Precautions Precautions: Back, Fall Precaution Booklet Issued: Yes (comment) Recall of Precautions/Restrictions: Intact Precaution/Restrictions Comments: educated on precautions, able to state 3/3 Restrictions Weight Bearing Restrictions Per Provider Order: No   Therapy/Group: Individual Therapy  Artemus Biles, OTR/L, MSOT  07/23/2023, 6:06 AM

## 2023-07-24 DIAGNOSIS — M5416 Radiculopathy, lumbar region: Secondary | ICD-10-CM | POA: Diagnosis not present

## 2023-07-24 LAB — BASIC METABOLIC PANEL WITH GFR
Anion gap: 5 (ref 5–15)
BUN: 12 mg/dL (ref 8–23)
CO2: 27 mmol/L (ref 22–32)
Calcium: 8.1 mg/dL — ABNORMAL LOW (ref 8.9–10.3)
Chloride: 104 mmol/L (ref 98–111)
Creatinine, Ser: 0.95 mg/dL (ref 0.61–1.24)
GFR, Estimated: >60 mL/min (ref >60)
Glucose, Bld: 125 mg/dL — ABNORMAL HIGH (ref 70–99)
Potassium: 3.6 mmol/L (ref 3.5–5.1)
Sodium: 136 mmol/L (ref 135–145)

## 2023-07-24 NOTE — Plan of Care (Signed)
  Problem: Consults Goal: RH GENERAL PATIENT EDUCATION Description: See Patient Education module for education specifics. Outcome: Progressing   Problem: RH SKIN INTEGRITY Goal: RH STG MAINTAIN SKIN INTEGRITY WITH ASSISTANCE Description: STG Maintain Skin Integrity With Supervision Assistance. Outcome: Progressing   Problem: RH SAFETY Goal: RH STG ADHERE TO SAFETY PRECAUTIONS W/ASSISTANCE/DEVICE Description: STG Adhere to Safety Precautions With Supervision Assistance/Device. Outcome: Progressing   Problem: RH PAIN MANAGEMENT Goal: RH STG PAIN MANAGED AT OR BELOW PT'S PAIN GOAL Description: < 3 on a 0-10 pain scale. Outcome: Progressing   Problem: RH KNOWLEDGE DEFICIT GENERAL Goal: RH STG INCREASE KNOWLEDGE OF SELF CARE AFTER HOSPITALIZATION Description: Patient will demonstrate knowledge of pain management, and medication management with educational materials and handouts provided by staff during Rehab stay. Outcome: Progressing

## 2023-07-24 NOTE — Progress Notes (Signed)
 PROGRESS NOTE   Subjective/Complaints: Satting well yesterday, O2 reduced from 4L to 3L Off O2 during shower this am with desats into 70s but with rest eventually back to 92% No new cough or congestion  ROS:  Pt denies SOB, abd pain, CP, N/V/C/D, and vision changes  Objective:   No results found. No results for input(s): "WBC", "HGB", "HCT", "PLT" in the last 72 hours.  Recent Labs    07/24/23 0737  NA 136  K 3.6  CL 104  CO2 27  GLUCOSE 125*  BUN 12  CREATININE 0.95  CALCIUM  8.1*    Intake/Output Summary (Last 24 hours) at 07/24/2023 0929 Last data filed at 07/24/2023 0750 Gross per 24 hour  Intake 715 ml  Output 1375 ml  Net -660 ml        Physical Exam: Vital Signs Blood pressure 98/74, pulse (!) 102, temperature 97.6 F (36.4 C), temperature source Oral, resp. rate 20, height 5\' 11"  (1.803 m), weight 103.5 kg, SpO2 96%.   General: No acute distress Mood and affect are appropriate Heart: Regular rate and rhythm no rubs murmurs or extra sounds Lungs: Clear to auscultation, breathing unlabored, no rales or wheezes Abdomen: Positive bowel sounds, soft nontender to palpation, nondistended Extremities: No clubbing, cyanosis, or edema Skin: No evidence of breakdown, no evidence of rash Neurological:     Comments: Patient is alert and oriented x 3.  Follows full commands. Strength is 5/5 except for LLE: 2/5 HF, 4/5 KE , 4/5 DF and PF  Assessment/Plan: 1. Functional deficits which require 3+ hours per day of interdisciplinary therapy in a comprehensive inpatient rehab setting. Physiatrist is providing close team supervision and 24 hour management of active medical problems listed below. Physiatrist and rehab team continue to assess barriers to discharge/monitor patient progress toward functional and medical goals  Care Tool:  Bathing              Bathing assist Assist Level: Contact Guard/Touching  assist     Upper Body Dressing/Undressing Upper body dressing   What is the patient wearing?: Pull over shirt    Upper body assist Assist Level: Supervision/Verbal cueing    Lower Body Dressing/Undressing Lower body dressing      What is the patient wearing?: Pants, Underwear/pull up     Lower body assist Assist for lower body dressing: Minimal Assistance - Patient > 75%     Toileting Toileting    Toileting assist Assist for toileting: Contact Guard/Touching assist     Transfers Chair/bed transfer  Transfers assist  Chair/bed transfer activity did not occur: Safety/medical concerns  Chair/bed transfer assist level: Set up assist     Locomotion Ambulation   Ambulation assist      Assist level: Contact Guard/Touching assist Assistive device: Walker-rolling Max distance: 95 ft   Walk 10 feet activity   Assist     Assist level: Contact Guard/Touching assist Assistive device: Walker-rolling   Walk 50 feet activity   Assist Walk 50 feet with 2 turns activity did not occur: Safety/medical concerns (endurance deficit)  Assist level: Contact Guard/Touching assist Assistive device: Walker-rolling    Walk 150 feet activity   Assist Walk 150  feet activity did not occur: Safety/medical concerns         Walk 10 feet on uneven surface  activity   Assist Walk 10 feet on uneven surfaces activity did not occur: Safety/medical concerns         Wheelchair     Assist Is the patient using a wheelchair?: Yes Type of Wheelchair: Manual Wheelchair activity did not occur: Safety/medical concerns  Wheelchair assist level: Dependent - Patient 0% Max wheelchair distance: x95 ft    Wheelchair 50 feet with 2 turns activity    Assist    Wheelchair 50 feet with 2 turns activity did not occur: Safety/medical concerns   Assist Level: Dependent - Patient 0%   Wheelchair 150 feet activity     Assist  Wheelchair 150 feet activity did not  occur: Safety/medical concerns   Assist Level: Dependent - Patient 0%   Blood pressure 98/74, pulse (!) 102, temperature 97.6 F (36.4 C), temperature source Oral, resp. rate 20, height 5\' 11"  (1.803 m), weight 103.5 kg, SpO2 96%.  Medical Problem List and Plan: 1. Functional deficits secondary to left L2 lumbar radiculopathy status post L2 compression fracture with kyphoplasty 07/08/2023             -patient may shower             -ELOS/Goals: 7-10 days modI             Admit to CIR   Con't CIR PT and OT  2.  Antithrombotics: -DVT/anticoagulation:  Pharmaceutical: Eliquis               3. Pain Management: continue Duragesic  patch, Lidoderm  patch, Robaxin  1000 mg every 6 hours as needed, Dilaudid  3 mg every 4 hours as needed   6/5- pt reports pain better controlled, but still taking pain meds frequently- if cannot get improvement in pain in the next few days, will increase Patch and decrease Dilaudid  frequency- very hard to get outpt  Pain ok only took dilaudid  3mg  yesterday and once today thus far  4. Mood/Behavior/Sleep: Paxil  10 mg nightly             -antipsychotic agents: N/A   5. Neuropsych/cognition: This patient is capable of making decisions on his own behalf.   6. Skin/Wound Care: Routine skin checks 7. Fluids/Electrolytes/Nutrition: Routine in and outs with follow-up chemistries   8.  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  Followed outpatient by Dr. Maria Shiner   9.  Chronic hypoxic respiratory failure/COPD/remote tobacco use.  Patient on 3 L oxygen nasal cannula prior to admission.  Maintained on prednisone  therapy   6/5- pt reports only used O2 prn unless in pain/SOB- but on 4L this AM Pt states at home he is usually on 3L but turns it up a little during activity 10.  Drug-induced neutropenia.  Follow-up hematology services   6/5- WBC 5.3 and Plts 172- will con't to follow 11.  PAF: continue Amiodarone  200 mg daily, Toprol  25 mg daily.   Cardiac rate controlled.  Follow  up cardiology services   6/5-6/6-  appeared in RRR this AM 12.  GERD.  Continue Protonix    13.  Pneumonitis: continue prednisone    6/5- weaning prednisone  14. Hypokalemia  6/5- will replet K+ for K+ of 3.4- with 40 MEq x1    Latest Ref Rng & Units 07/24/2023    7:37 AM 07/21/2023    5:49 AM 07/20/2023    8:21 AM  BMP  Glucose  70 - 99 mg/dL 161  95  096   BUN 8 - 23 mg/dL 12  15  15    Creatinine 0.61 - 1.24 mg/dL 0.45  4.09  8.11   Sodium 135 - 145 mmol/L 136  137  137   Potassium 3.5 - 5.1 mmol/L 3.6  3.4  3.3   Chloride 98 - 111 mmol/L 104  103  102   CO2 22 - 32 mmol/L 27  27  26    Calcium  8.9 - 10.3 mg/dL 8.1  8.5  8.6   Taking Na+ tabs , pt would like to stop but Na+ low normal  6/8 K+ normal   LOS: 4 days A FACE TO FACE EVALUATION WAS PERFORMED  Genetta Kenning 07/24/2023, 9:29 AM

## 2023-07-24 NOTE — Progress Notes (Signed)
 Physical Therapy Session Note  Patient Details  Name: John [name] MRN: [id number] Date of Birth: 03/07/1960  Today's Date: 07/24/2023 PT Individual Time: 4098-1191 PT Individual Time Calculation (min): 30 min   Short Term Goals: Week 1:  PT Short Term Goal 1 (Week 1): = LTG  Skilled Therapeutic Interventions/Progress Updates:    Session focused on activity tolerance and finding limits for gait before desat with O2. Pt completed bed mobility mod I for supine <> sit and no reports of dizziness in session. Completed several shorter distance bouts of gait with overall supervision to CGA for balance with rollator for functional strengthening and endurance. Vitals monitored between sets.  20' gait with O2 = 87%; HR = 98 bpm 60' gait with O2 = 88%; HR = 99 bpm 75' gait with O2 = 86-87%; HR = 104 bpm and increased work of breathing noted - cues to stop talking during recovery 65' gait with O2 = 8687%; HR = 112 bpm; extra time to recover but came back up to 89% 20' gait with O2 = 85% (one dip to 83-84% while talking) and then back up to 88%; HR - 104 bpm  Education provided on energy conservation, progression of distances, and self pacing. Pt receptive to all education.  Therapy Documentation Precautions:  Precautions Precautions: Back, Fall Precaution Booklet Issued: Yes (comment) Recall of Precautions/Restrictions: Intact Precaution/Restrictions Comments: educated on precautions, able to state 3/3 Restrictions Weight Bearing Restrictions Per Provider Order: No    Vital Signs:  O2 Device: Nasal Cannula O2 Flow Rate (L/min): 4 L/min  Pain: No reports of pain.   Therapy/Group: Individual Therapy  Gita Lamb Amadeo June, PT, DPT, CBIS  07/24/2023, 1:37 PM

## 2023-07-24 NOTE — Progress Notes (Signed)
 Pleased with progress. Uses flutter valve & incentive spirometer frequently. O2 sat 97, 98% and 99%, decreased 02 to 3L/m vs 4 L/M. Rechecked 02 sat=97%. Lidoderm  patch applied to lower back. Reports BM every morning. John [name] A

## 2023-07-24 NOTE — Progress Notes (Signed)
 Occupational Therapy Session Note  Patient Details  Name: John [name] MRN: [id number] Date of Birth: Jun 04, 1960  Today's Date: 07/24/2023 OT Individual Time: 6578-4696 & 1400-1445 OT Individual Time Calculation (min): 82 min & 45 min   Short Term Goals: Week 1:  OT Short Term Goal 1 (Week 1): STG=LTG d/t ELOS  Skilled Therapeutic Interventions/Progress Updates:      Therapy Documentation Precautions:  Precautions Precautions: Back, Fall Precaution Booklet Issued: Yes (comment) Recall of Precautions/Restrictions: Intact Precaution/Restrictions Comments: educated on precautions, able to state 3/3 Restrictions Weight Bearing Restrictions Per Provider Order: No Session 1 General: "Good morning!" Pt supine in bed upon OT arrival on 3L O2 [place], agreeable to OT session.  Pain: 4/10 pain reported in low back and Lt leg, activity, intermittent rest breaks, distractions provided for pain management, pt reports tolerable to proceed.   ADL: OT providing skilled intervention on ADL retraining in order to increase independence with tasks and increase activity tolerance. Pt completed the following tasks at the current level of assist: Bed mobility: SBA supine>EOB with slight HOB raised Grooming/oral hygiene: set up seated in W/C at sink for shaving and managing hair Toilet transfer: CGA emerging close supervision with rollator, pt reports feeling a bit more comfortable with it, ambulating from bed>toilet, OT assisting with managing O2 tank Toileting: supervision, completing 3/3 steps of toileting, small BM UB dressing: SBA donning/doffing overhead shirt while seated LB dressing: SBA seated on TTB for donning/doffing overhead shirt Shower transfer: close supervision with stand step transfer from toilet>TTB, OT assisting with managing O2 tank Bathing: SBA seated on TTB, O2 donned during bathing tasks, seated on TTB for entire shower   OT re-iterating energy conservation techniques during ADL  retraining and rest breaks in order to manage O2. At end of session, O2 checked and was in high 70s, rest breaks and bumped up to 4L, back up to 87 then resting at 81-83, OT enforcing pursed lip breathing. Pt also presenting with low BP, see flowsheets, OT adding TEDs for BP management.    Pt seated in recliner with nsg present for medication handout and O2 management   Session 2 General: "I feel better this afternoon" Pt supine in bed upon OT arrival, agreeable to OT session.  Vital Signs: steadily monitoring O2 for entire session, <86% for entire session  Pain: no pain reported  Exercises: OT providing skilled intervention for the following exercise circuit in order to improve functional activity, strength and endurance to prepare for ADLs such as bathing. Pt completed the following exercises in seated position with no noted LOB/SOB and 3x10 repetitions on each exercise with green theraband: -triceps extensions -shoulder abduction -shoulder flexion -resistive marches LLE -hip extension LLE  OT providing rest breaks and pacing in order to conserve energy during session.    Pt supine in bed with bed alarm activated, 2 bed rails up, call light within reach and 4Ps assessed.   Therapy/Group: Individual Therapy  Nila Barth, OTD, OTR/L 07/24/2023, 3:47 PM

## 2023-07-24 NOTE — Progress Notes (Signed)
 MEWS Progress Note  Patient Details Name: John [name] MRN: [id number] DOB: 1960/05/14 Today's Date: 07/24/2023   MEWS Flowsheet Documentation:  Assess: MEWS Score Temp: 97.7 F (36.5 C) BP: 102/68 MAP (mmHg): 80 Pulse Rate: 88 Resp: 12 Level of Consciousness: Alert SpO2: 97 % O2 Device: Nasal Cannula O2 Flow Rate (L/min): 4 L/min FiO2 (%): 40 % Assess: MEWS Score MEWS Temp: 0 MEWS Systolic: 0 MEWS Pulse: 0 MEWS RR: 1 MEWS LOC: 0 MEWS Score: 1 MEWS Score Color: Green Assess: SIRS CRITERIA SIRS Temperature : 0 SIRS Respirations : 0 SIRS Pulse: 0 SIRS WBC: 0 SIRS Score Sum : 0 SIRS Temperature : 0 SIRS Pulse: 1 SIRS Respirations : 0 SIRS WBC: 0 SIRS Score Sum : 1 Assess: if the MEWS score is Yellow or Red Were vital signs accurate and taken at a resting state?: Yes Does the patient meet 2 or more of the SIRS criteria?: Yes Does the patient have a confirmed or suspected source of infection?: No MEWS guidelines implemented : Yes, yellow Treat MEWS Interventions: Considered administering scheduled or prn medications/treatments as ordered Take Vital Signs Increase Vital Sign Frequency : Yellow: Q2hr x1, continue Q4hrs until patient remains green for 12hrs Escalate MEWS: Escalate: Yellow: Discuss with charge nurse and consider notifying provider and/or RRT        Annalee Barren 07/24/2023, 1:09 PM

## 2023-07-24 NOTE — Progress Notes (Signed)
 Physical Therapy Session Note  Patient Details  Name: Kollyn Lingafelter MRN: [id number] Date of Birth: 1960/09/12  Today's Date: 07/24/2023 PT Individual Time: 1000-1045 PT Individual Time Calculation (min): 45 min   Short Term Goals: Week 1:  PT Short Term Goal 1 (Week 1): = LTG  Skilled Therapeutic Interventions/Progress Updates:   Pt presents up in recliner with sats stable around 90%. Transferred to w/c with rollator with CGA cues for use of breaks.   Transferred no AD to/from Nustep with CGA for balance. Nustep for functional strengthening and overall cardiovascular endurance training with close monitoring of vitals.  Level 1 resistance with pace partner program with pace set for 35 steps per min x 15 min total with 2 rest breaks (see vitals below).   0.3 miles; avg steps per min = 28   Pt self propelled w/c to and from therapy for functional strengthening and endurance x 120' with supervision.   Transferred back to bed with LLE buckling with min A to recover and returned to supine mod I. All needs in reach.  Pt tolerated tx well this session.     Therapy Documentation Precautions:  Precautions Precautions: Back, Fall Precaution Booklet Issued: Yes (comment) Recall of Precautions/Restrictions: Intact Precaution/Restrictions Comments: educated on precautions, able to state 3/3 Restrictions Weight Bearing Restrictions Per Provider Order: No    Vital Signs: 4L O2 via [place] (using HF tubing - works best for patient)   At rest = 90% O2  With Activity = 87-88% HR = 100-103 bpm  Pain: Denies pain    Therapy/Group: Individual Therapy  Gita Lamb Amadeo June, PT, DPT, CBIS  07/24/2023, 12:11 PM

## 2023-07-25 DIAGNOSIS — M5416 Radiculopathy, lumbar region: Secondary | ICD-10-CM | POA: Diagnosis not present

## 2023-07-25 MED ORDER — SENNOSIDES-DOCUSATE SODIUM 8.6-50 MG PO TABS
2.0000 | ORAL_TABLET | Freq: Two times a day (BID) | ORAL | Status: DC | PRN
  Administered 2023-07-29: 2 via ORAL
  Filled 2023-07-25: qty 2

## 2023-07-25 MED ORDER — SODIUM CHLORIDE 1 G PO TABS
1.0000 g | ORAL_TABLET | Freq: Every day | ORAL | Status: DC
  Administered 2023-07-26 – 2023-07-28 (×3): 1 g via ORAL
  Filled 2023-07-25 (×3): qty 1

## 2023-07-25 NOTE — Progress Notes (Signed)
 PROGRESS NOTE   Subjective/Complaints:  Pt on 4L O2 currently this AM- said no SOB or issues with desats over weekend, but per chart, occurred Saturday with therapy- down to 70's.   Having BM every AM- wants to make Senna 2 tabs BID prn since going daily.   Using ICS/Flutter valve regularly.     ROS:   Pt denies SOB, abd pain, CP, N/V/C/D, and vision changes   Objective:   No results found. No results for input(s): "WBC", "HGB", "HCT", "PLT" in the last 72 hours.  Recent Labs    07/24/23 0737  NA 136  K 3.6  CL 104  CO2 27  GLUCOSE 125*  BUN 12  CREATININE 0.95  CALCIUM  8.1*    Intake/Output Summary (Last 24 hours) at 07/25/2023 0830 Last data filed at 07/25/2023 0718 Gross per 24 hour  Intake 1434 ml  Output 1700 ml  Net -266 ml        Physical Exam: Vital Signs Blood pressure 104/75, pulse 64, temperature 97.6 F (36.4 C), resp. rate 18, height 5\' 11"  (1.803 m), weight 103.5 kg, SpO2 96%.    General: awake, alert, appropriate, supine in bed; NAD HENT: conjugate gaze; oropharynx moist CV: regular rate; no JVD Pulmonary: CTA B/L; no W/R/R- decreased at bases- 4L O2 by [place] GI: soft, NT, ND, (+)BS, normoactive Psychiatric: appropriate Neurological: Ox3  Neurological:     Comments: Patient is alert and oriented x 3.  Follows full commands. Strength is 5/5 except for LLE: 2/5 HF, 4/5 KE , 4/5 DF and PF  Assessment/Plan: 1. Functional deficits which require 3+ hours per day of interdisciplinary therapy in a comprehensive inpatient rehab setting. Physiatrist is providing close team supervision and 24 hour management of active medical problems listed below. Physiatrist and rehab team continue to assess barriers to discharge/monitor patient progress toward functional and medical goals  Care Tool:  Bathing              Bathing assist Assist Level: Contact Guard/Touching assist     Upper Body  Dressing/Undressing Upper body dressing   What is the patient wearing?: Pull over shirt    Upper body assist Assist Level: Supervision/Verbal cueing    Lower Body Dressing/Undressing Lower body dressing      What is the patient wearing?: Pants, Underwear/pull up     Lower body assist Assist for lower body dressing: Minimal Assistance - Patient > 75%     Toileting Toileting    Toileting assist Assist for toileting: Contact Guard/Touching assist     Transfers Chair/bed transfer  Transfers assist  Chair/bed transfer activity did not occur: Safety/medical concerns  Chair/bed transfer assist level: Contact Guard/Touching assist     Locomotion Ambulation   Ambulation assist      Assist level: Contact Guard/Touching assist Assistive device: Walker-rolling Max distance: 75'   Walk 10 feet activity   Assist     Assist level: Supervision/Verbal cueing Assistive device: Rollator   Walk 50 feet activity   Assist Walk 50 feet with 2 turns activity did not occur: Safety/medical concerns (endurance deficit)  Assist level: Contact Guard/Touching assist Assistive device: Rollator    Walk 150 feet  activity   Assist Walk 150 feet activity did not occur: Safety/medical concerns         Walk 10 feet on uneven surface  activity   Assist Walk 10 feet on uneven surfaces activity did not occur: Safety/medical concerns         Wheelchair     Assist Is the patient using a wheelchair?: Yes Type of Wheelchair: Manual Wheelchair activity did not occur: Safety/medical concerns  Wheelchair assist level: Dependent - Patient 0% Max wheelchair distance: x95 ft    Wheelchair 50 feet with 2 turns activity    Assist    Wheelchair 50 feet with 2 turns activity did not occur: Safety/medical concerns   Assist Level: Dependent - Patient 0%   Wheelchair 150 feet activity     Assist  Wheelchair 150 feet activity did not occur: Safety/medical  concerns   Assist Level: Dependent - Patient 0%   Blood pressure 104/75, pulse 64, temperature 97.6 F (36.4 C), resp. rate 18, height 5\' 11"  (1.803 m), weight 103.5 kg, SpO2 96%.  Medical Problem List and Plan: 1. Functional deficits secondary to left L2 lumbar radiculopathy status post L2 compression fracture with kyphoplasty 07/08/2023             -patient may shower             -ELOS/Goals: 7-10 days modI             Admit to CIR   Con't CIR PT and OT  Team conference tomorrow 2.  Antithrombotics: -DVT/anticoagulation:  Pharmaceutical: Eliquis               3. Pain Management: continue Duragesic  patch, Lidoderm  patch, Robaxin  1000 mg every 6 hours as needed, Dilaudid  3 mg every 4 hours as needed   6/5- pt reports pain better controlled, but still taking pain meds frequently- if cannot get improvement in pain in the next few days, will increase Patch and decrease Dilaudid  frequency- very hard to get outpt  Pain ok only took dilaudid  3mg  yesterday and once today thus far   6/9- last dilaudid  yesterday at 1pm- doing so much better- only 2x yesterday 4. Mood/Behavior/Sleep: Paxil  10 mg nightly             -antipsychotic agents: N/A   5. Neuropsych/cognition: This patient is capable of making decisions on his own behalf.   6. Skin/Wound Care: Routine skin checks 7. Fluids/Electrolytes/Nutrition: Routine in and outs with follow-up chemistries   8.  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  Followed outpatient by Dr. Maria Shiner   9.  Chronic hypoxic respiratory failure/COPD/remote tobacco use.  Patient on 3 L oxygen nasal cannula prior to admission.  Maintained on prednisone  therapy   6/5- pt reports only used O2 prn unless in pain/SOB- but on 4L this AM Pt states at home he is usually on 3L but turns it up a little during activity  6/9- will need to go home at 3-4L and can increase for activity if needed 10.  Drug-induced neutropenia.  Follow-up hematology  services   6/5- WBC 5.3 and Plts 172- will con't to follow 11.  PAF: continue Amiodarone  200 mg daily, Toprol  25 mg daily.  Cardiac rate controlled.  Follow  up cardiology services   6/5-6/6-  appeared in RRR this AM 12.  GERD.  Continue Protonix    13.  Pneumonitis: continue prednisone    6/5- weaning prednisone  14. Hypokalemia  6/5- will replet K+ for K+ of  3.4- with 40 MEq x1    Latest Ref Rng & Units 07/24/2023    7:37 AM 07/21/2023    5:49 AM 07/20/2023    8:21 AM  BMP  Glucose 70 - 99 mg/dL 045  95  409   BUN 8 - 23 mg/dL 12  15  15    Creatinine 0.61 - 1.24 mg/dL 8.11  9.14  7.82   Sodium 135 - 145 mmol/L 136  137  137   Potassium 3.5 - 5.1 mmol/L 3.6  3.4  3.3   Chloride 98 - 111 mmol/L 104  103  102   CO2 22 - 32 mmol/L 27  27  26    Calcium  8.9 - 10.3 mg/dL 8.1  8.5  8.6   Taking Na+ tabs , pt would like to stop but Na+ low normal  6/8 K+ normal  6/9- Na 136- K+ 3.6 yesterday- will decrease Salt tabs to daily with breakfast and recheck labs Thursday   I spent a total of 43   minutes on total care today- >50% coordination of care- due to  D/w pt about bowels and meds- d/w nurse about bowels and Salt tabs- reducing Na tab dosage, and review of labs, vitals and B/B  LOS: 5 days A FACE TO FACE EVALUATION WAS PERFORMED  Alexiana Laverdure 07/25/2023, 8:30 AM

## 2023-07-25 NOTE — Plan of Care (Signed)
  Problem: RH Ambulation Goal: LTG Patient will ambulate in controlled environment (PT) Description: LTG: Patient will ambulate in a controlled environment, # of feet with assistance (PT). Flowsheets (Taken 07/25/2023 0749) LTG: Pt will ambulate in controlled environ  assist needed:: (downgraded due to endurance) Supervision/Verbal cueing LTG: Ambulation distance in controlled environment: 82' Note: Downgraded due to endurance

## 2023-07-25 NOTE — Progress Notes (Signed)
 Physical Therapy Session Note  Patient Details  Name: John [name] MRN: [id number] Date of Birth: 08-Apr-1960  Today's Date: 07/25/2023 PT Individual Time: 1030-1128 PT Individual Time Calculation (min): 58 min   Short Term Goals: Week 1:  PT Short Term Goal 1 (Week 1): = LTG  Skilled Therapeutic Interventions/Progress Updates:    Session focused on d/c planning and education. Discussed at length equipment needs, home set up, modifications to daily schedule, and HEP. Discussed options for home desk/work set up as pt spends majority of time there including seating recommendations (back support, no wheels, adjustable). Discussed and planned for activity program at home to deter him from sitting in chair several hours at a time including an HEP (seated and standing options) and short distance gait every 30-60 min to change position and activity during his work day. Goal for pt to come up with a draft of how he can incorporate this into his schedule. Issued HEP and reviewed with pt. Also discussed plan for sleeping arrangements as pt reports his bed is too high and he is looking into getting a different bed/mattress to improve positioning. Recommending w/c and rollator for home - pt with access to rollator so will notify CSW of need for w/c. Discussed overall options of rental vs custom w/c big picture for options out there. Planning for rental at this time.  Access Code: [email or web address] URL: [email or web address] Date: 07/25/2023 Prepared by: Richad Champagne  Exercises - Seated Long Arc Quad  - 1 x daily - 7 x weekly - 10 reps - Seated March  - 1 x daily - 7 x weekly - 10 reps - Seated Hip Abduction with Resistance  - 1 x daily - 7 x weekly - 10 reps - Seated Hip Adduction Isometrics with Ball  - 1 x daily - 7 x weekly - 10 reps - Sit to Stand with Armchair  - 1 x daily - 7 x weekly - 3-5 reps - Standing Hip Abduction with Counter Support  - 1 x daily - 7 x weekly - 10 reps - Mini Squat with  Counter Support  - 1 x daily - 7 x weekly - 3-5 reps - Standing Heel Raise with Support  - 1 x daily - 7 x weekly - 10 reps  Pt performed sit <> stand from recliner to adjust and add cushion for improved sitting tolerance with supervision.   Therapy Documentation Precautions:  Precautions Precautions: Back, Fall Precaution Booklet Issued: Yes (comment) Recall of Precautions/Restrictions: Intact Precaution/Restrictions Comments: educated on precautions, able to state 3/3 Restrictions Weight Bearing Restrictions Per Provider Order: No    Pain:  Denies pain.     Therapy/Group: Individual Therapy  Gita Lamb Amadeo June, PT, DPT, CBIS  07/25/2023, 12:02 PM

## 2023-07-25 NOTE — Progress Notes (Addendum)
 Occupational Therapy Session Note  Patient Details  Name: John [name] MRN: [id number] Date of Birth: 1960-09-25  Today's Date: 07/25/2023 OT Individual Time: 1432-1500 OT Individual Time Calculation (min): 28 min    Short Term Goals: Week 1:  OT Short Term Goal 1 (Week 1): STG=LTG d/t ELOS  Skilled Therapeutic Interventions/Progress Updates:    Pt received sitting in the recliner with no c/o pain. He was on 4 L O2 and sats remained around 88-90%. Stand pivot to the w/c with CGA. He was able to propel the w/c 50 ft before fatiguing. Pt completed the BUE ergometer to challenge BUE strength and endurance needed to complete ADLs and IADLs with the highest level of independence. Pt completed 10 min with cueing for pacing and technique. Level 1 resistance completed d/t SpO2 borderline low at 88%- pt asymptomatic. Preferred music played during ergometer to increase mood and psychosocial adjustment to situation. He returned to his room following via w/c. Stand pivot back to recliner with CGA. Stand pivot back to bed with CGA. He was left supine with all needs met- on 4L O2. Bed alarm set.   Therapy Documentation Precautions:  Precautions Precautions: Back, Fall Precaution Booklet Issued: Yes (comment) Recall of Precautions/Restrictions: Intact Precaution/Restrictions Comments: educated on precautions, able to state 3/3 Restrictions Weight Bearing Restrictions Per Provider Order: No  Therapy/Group: Individual Therapy  Una Ganser 07/25/2023, 6:42 AM

## 2023-07-25 NOTE — Progress Notes (Signed)
 Physical Therapy Session Note  Patient Details  Name: Pio Eatherly MRN: [id number] Date of Birth: 04/14/1960  Today's Date: 07/25/2023 PT Individual Time: 1300-1400 PT Individual Time Calculation (min): 60 min   Short Term Goals: Week 1:  PT Short Term Goal 1 (Week 1): = LTG  Skilled Therapeutic Interventions/Progress Updates:    Pt in recliner working when PT arrived.  Pt requesting to use lavatory prior. Pt able to demonstrate supervision with w/c<->commode transfers, good standing balance to don pants. Pt's SPO2 92% 4L/min via [place] Pt started on NuStep x10 minutes (pt stating how much he loves it).  Pt reports he is good sitting down, but as soon as he stands, his O2 drops. PT focusing on improving pt's standing tolerance with O2 sat in proper range (86-92%). Pt able to stand up to 1 minute max tolerance before sitting after 3 trials. SPO2 83%, recovers to 88% ~ 45 seconds.  Pt instructed on diaphragmatic breathing throughout session.  Stair negotiation x1 step with bilateral railing for support. Pt able to perform 3 reps before needing to rest.  Pt demonstrates good standing balance with recliner<->chair transfers without device. Pt is showing improved mobility, endurance.    Therapy Documentation Precautions:  Precautions Precautions: Back, Fall Precaution Booklet Issued: Yes (comment) Recall of Precautions/Restrictions: Intact Precaution/Restrictions Comments: educated on precautions, able to state 3/3 Restrictions Weight Bearing Restrictions Per Provider Order: No General:   Vital Signs: Therapy Vitals Temp: 98 F (36.7 C) Temp Source: Oral Pulse Rate: 87 Resp: 18 BP: 104/71 Patient Position (if appropriate): Lying Oxygen Therapy SpO2: 93 % O2 Device: Room Air Pain: Denies pain    Therapy/Group: Individual Therapy  [name] 07/25/2023, 3:58 PM

## 2023-07-25 NOTE — Progress Notes (Signed)
 Occupational Therapy Session Note  Patient Details  Name: John [name] MRN: [id number] Date of Birth: 05-02-1960  Today's Date: 07/25/2023 OT Individual Time: 3474-2595 OT Individual Time Calculation (min): 60 min    Short Term Goals: Week 1:  OT Short Term Goal 1 (Week 1): STG=LTG d/t ELOS  Skilled Therapeutic Interventions/Progress Updates:      Therapy Documentation Precautions:  Precautions Precautions: Back, Fall Precaution Booklet Issued: Yes (comment) Recall of Precautions/Restrictions: Intact Precaution/Restrictions Comments: educated on precautions, able to state 3/3 Restrictions Weight Bearing Restrictions Per Provider Order: No General: "Better today!" Pt supine in bed upon OT arrival, agreeable to OT session.  Vital Signs: O2 above 82% for entire session, focusing on recovery time and activity pacing, highest O2 during session was 87%  Pain: no pain reported  ADL: OT providing skilled intervention on ADL retraining in order to increase independence with tasks and increase activity tolerance. Pt completed the following tasks at the current level of assist: Bed mobility: SBA with flat bed and use of bed rails Grooming/oral hygiene: set up seated in W/C  Toilet transfer: SBA with use of rollator, OT assisting managing O2 tank  Toileting: SBA, pt completing 3/3 toileting steps without LOB/SOB  Exercises: Pt completed the following exercise circuit in order to improve functional activity, strength and endurance to prepare for ADLs such as bathing. Pt completed the following exercises in seated position with no noted LOB/SOB: -modified squat (3x5 reps) with rest breaks in between sets   Other Treatments: Pt completed 10 minutes of arm bike in order to increase BUE/BLEstrength and overall cardiovascular endurance in preparation for increased independence in ADLs. Rest break after 5 min, on level 1 resistance. OT focusing on keeping steady pace for increasing activity  tolerance.    Pt seated in recliner at end of session with W/C alarm donned, call light within reach and 4Ps assessed.    Therapy/Group: Individual Therapy  Nila Barth, OTD, OTR/L 07/25/2023, 4:23 PM

## 2023-07-26 ENCOUNTER — Inpatient Hospital Stay: Admitting: Nurse Practitioner

## 2023-07-26 DIAGNOSIS — R4589 Other symptoms and signs involving emotional state: Secondary | ICD-10-CM | POA: Diagnosis not present

## 2023-07-26 DIAGNOSIS — M5416 Radiculopathy, lumbar region: Secondary | ICD-10-CM | POA: Diagnosis not present

## 2023-07-26 NOTE — Progress Notes (Signed)
 PROGRESS NOTE   Subjective/Complaints:  Pt reports still on 4L O2- says didn't use all the time at home, but admits was in bed "all the time" when at home and breathing better here.   LBM yesterday , going daily.  Breathing better/ok No other issues ROS:  Per HPI  Pt denies SOB, abd pain, CP, N/V/C/D, and vision changes  Objective:   No results found. No results for input(s): "WBC", "HGB", "HCT", "PLT" in the last 72 hours.  Recent Labs    07/24/23 0737  NA 136  K 3.6  CL 104  CO2 27  GLUCOSE 125*  BUN 12  CREATININE 0.95  CALCIUM  8.1*    Intake/Output Summary (Last 24 hours) at 07/26/2023 1228 Last data filed at 07/26/2023 0945 Gross per 24 hour  Intake 830 ml  Output 1625 ml  Net -795 ml        Physical Exam: Vital Signs Blood pressure 96/65, pulse 74, temperature 98 F (36.7 C), resp. rate 18, height 5\' 11"  (1.803 m), weight 103.5 kg, SpO2 96%.     General: awake, alert, appropriate, supine; NAD HENT: conjugate gaze; oropharynx dry- on 4L O2 by [place] CV: regular rate; no JVD Pulmonary: CTA B/L; no W/R/R-decreased at bases- Esp RLL GI: soft, NT, ND, (+)BS Psychiatric: appropriate Neurological: Ox3   Neurological:     Comments: Patient is alert and oriented x 3.  Follows full commands. Strength is 5/5 except for LLE: 2/5 HF, 4/5 KE , 4/5 DF and PF  Assessment/Plan: 1. Functional deficits which require 3+ hours per day of interdisciplinary therapy in a comprehensive inpatient rehab setting. Physiatrist is providing close team supervision and 24 hour management of active medical problems listed below. Physiatrist and rehab team continue to assess barriers to discharge/monitor patient progress toward functional and medical goals  Care Tool:  Bathing              Bathing assist Assist Level: Contact Guard/Touching assist     Upper Body Dressing/Undressing Upper body dressing   What is  the patient wearing?: Pull over shirt    Upper body assist Assist Level: Supervision/Verbal cueing    Lower Body Dressing/Undressing Lower body dressing      What is the patient wearing?: Pants, Underwear/pull up     Lower body assist Assist for lower body dressing: Minimal Assistance - Patient > 75%     Toileting Toileting    Toileting assist Assist for toileting: Contact Guard/Touching assist     Transfers Chair/bed transfer  Transfers assist  Chair/bed transfer activity did not occur: Safety/medical concerns  Chair/bed transfer assist level: Supervision/Verbal cueing     Locomotion Ambulation   Ambulation assist      Assist level: Contact Guard/Touching assist Assistive device: Walker-rolling Max distance: 75'   Walk 10 feet activity   Assist     Assist level: Supervision/Verbal cueing Assistive device: Rollator   Walk 50 feet activity   Assist Walk 50 feet with 2 turns activity did not occur: Safety/medical concerns (endurance deficit)  Assist level: Contact Guard/Touching assist Assistive device: Rollator    Walk 150 feet activity   Assist Walk 150 feet activity did not  occur: Safety/medical concerns         Walk 10 feet on uneven surface  activity   Assist Walk 10 feet on uneven surfaces activity did not occur: Safety/medical concerns         Wheelchair     Assist Is the patient using a wheelchair?: Yes Type of Wheelchair: Manual Wheelchair activity did not occur: Safety/medical concerns  Wheelchair assist level: Dependent - Patient 0% Max wheelchair distance: x95 ft    Wheelchair 50 feet with 2 turns activity    Assist    Wheelchair 50 feet with 2 turns activity did not occur: Safety/medical concerns   Assist Level: Dependent - Patient 0%   Wheelchair 150 feet activity     Assist  Wheelchair 150 feet activity did not occur: Safety/medical concerns   Assist Level: Dependent - Patient 0%   Blood  pressure 96/65, pulse 74, temperature 98 F (36.7 C), resp. rate 18, height 5\' 11"  (1.803 m), weight 103.5 kg, SpO2 96%.  Medical Problem List and Plan: 1. Functional deficits secondary to left L2 lumbar radiculopathy status post L2 compression fracture with kyphoplasty 07/08/2023             -patient may shower             -ELOS/Goals: 7-10 days modI             Admit to CIR   Con't CIR PT and OT  Team conference today d/c date 6/14- since wife can only pick up Saturday 2.  Antithrombotics: -DVT/anticoagulation:  Pharmaceutical: Eliquis               3. Pain Management: continue Duragesic  patch, Lidoderm  patch, Robaxin  1000 mg every 6 hours as needed, Dilaudid  3 mg every 4 hours as needed   6/5- pt reports pain better controlled, but still taking pain meds frequently- if cannot get improvement in pain in the next few days, will increase Patch and decrease Dilaudid  frequency- very hard to get outpt  Pain ok only took dilaudid  3mg  yesterday and once today thus far   6/9- last dilaudid  yesterday at 1pm- doing so much better- only 2x yesterday  6/10- Last dose of Dilaudid  yesterday afternoon- taking rarely 4. Mood/Behavior/Sleep: Paxil  10 mg nightly             -antipsychotic agents: N/A   5. Neuropsych/cognition: This patient is capable of making decisions on his own behalf.   6. Skin/Wound Care: Routine skin checks 7. Fluids/Electrolytes/Nutrition: Routine in and outs with follow-up chemistries   8.  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  Followed outpatient by Dr. Maria Shiner   9.  Chronic hypoxic respiratory failure/COPD/remote tobacco use.  Patient on 3 L oxygen nasal cannula prior to admission.  Maintained on prednisone  therapy   6/5- pt reports only used O2 prn unless in pain/SOB- but on 4L this AM Pt states at home he is usually on 3L but turns it up a little during activity  6/9- will need to go home at 3-4L and can increase for activity if needed  6/10  d/w SW- will get pt's Home O2 4L since that's high flow 10.  Drug-induced neutropenia.  Follow-up hematology services   6/5- WBC 5.3 and Plts 172- will con't to follow 11.  PAF: continue Amiodarone  200 mg daily, Toprol  25 mg daily.  Cardiac rate controlled.  Follow  up cardiology services   6/5-6/6-  appeared in RRR this AM 12.  GERD.  Continue Protonix    13.  Pneumonitis: continue prednisone    6/5- weaning prednisone  14. Hypokalemia  6/5- will replet K+ for K+ of 3.4- with 40 MEq x1    Latest Ref Rng & Units 07/24/2023    7:37 AM 07/21/2023    5:49 AM 07/20/2023    8:21 AM  BMP  Glucose 70 - 99 mg/dL 161  95  096   BUN 8 - 23 mg/dL 12  15  15    Creatinine 0.61 - 1.24 mg/dL 0.45  4.09  8.11   Sodium 135 - 145 mmol/L 136  137  137   Potassium 3.5 - 5.1 mmol/L 3.6  3.4  3.3   Chloride 98 - 111 mmol/L 104  103  102   CO2 22 - 32 mmol/L 27  27  26    Calcium  8.9 - 10.3 mg/dL 8.1  8.5  8.6   Taking Na+ tabs , pt would like to stop but Na+ low normal  6/8 K+ normal  6/9- Na 136- K+ 3.6 yesterday- will decrease Salt tabs to daily with breakfast and recheck labs Thursday   I spent a total of 35   minutes on total care today- >50% coordination of care- due to   Team conference today and d/w pt about plan for dc  LOS: 6 days A FACE TO FACE EVALUATION WAS PERFORMED  Vernestine Brodhead 07/26/2023, 12:28 PM

## 2023-07-26 NOTE — Consult Note (Signed)
 Neuropsychological Consultation Comprehensive Inpatient Rehab   Patient:   John [name]   DOB:   [birth year]  MR Number:  [id number]  Location:  [facility] [address] [place] [address] Dept: [phone] Loc: [phone]           Date of Service:   07/26/2023  Start Time:   3 PM End Time:   4 PM  Provider/Observer:  John [name], Psy.D.       Clinical Neuropsychologist       Billing Code/Service: [id number]  Reason for Service:    John [name] is a 63 year old male referred for neuropsychological consultation during his admission to the comprehensive inpatient rehabilitation unit due to coping and adjustment issues with extensive complicated medical [name].  Patient has had significant back difficulties and back pain, COPD and stage IV metastatic squamous cell carcinoma of right upper lobe on chemotherapy.  Patient presented on 07/02/2023 to [facility] with increasing back pain and shortness of breath.  Patient did need oxygen support with saturations of 88% prior to being placed on 6 L oxygen.  Patient with evolving subacute compression fractures and chronic compression deformities in lower thoracic and upper lumbar region with image-guided L2 vertebral augmentation kyphoplasty technique by Dr. Mabel Savage of IR.  Patient is continuing to be seen by Dr. Ennever/oncology.  Patient admitted to CIR due to decreased functional mobility.  During today's clinical visit the patient was awake and alert with good cognition.  He was well versed  in his medical status and has been dealing with complicated medical issues for some time.  Patient was very observant of various aspects to different units of the hospital and different specialty services that he has been around and felt like he was being well taking care of overall.  Patient was motivated to participate in therapeutic interventions but is aware of his  complicated medical status.  Patient described good comfort with care he is getting through oncology services and in particular Dr. Maria Shiner and will continue with them postdischarge.  Today we worked on coping and adjustment issues as the patient has had considerable stressors to deal with.  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  The patient acknowledged the emotional impact of his metastatic cancer but also is well aware of the efforts and progress that has been made to keep him alive over the past several years.  HPI for the current admission:    HPI: John [name] is a 63 year old right handed male with [name] significant for chronic back pain 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 Harvie Liner with past attempts at cardioversion, anxiety/depression.  Per chart review patient lives with spouse.  1 level home 4 steps to entry.  Patient reportedly independent at baseline prior to recent hospitalization.  Presented 07/02/2023 to [facility] with increasing back pain and shortness of breath.  Patient had reported progressive  back pain over the past few months radiating to the lower extremities maintained on Percocet 5-3 25 1-2 tabs every 6 hours as needed as well as Lidoderm  patch.  Noted oxygen saturations 88% which improved with 6 L.  Chemistry showed sodium 124 potassium 3.3, chloride 91, total  bilirubin 1.6, WBC 1.3, blood cultures no growth to date.  CT angiogram of the chest showed no evidence for pulmonary emboli.  Stable multifocal areas of groundglass opacity throughout both lungs along with some peripheral reticular opacities.  MRI thoracic spine showed no evidence for metastatic disease within the thoracic spine.  Evolving subacute compression fracture involving the superior endplate of T11 with no more than mild residual marrow edema.  Height loss not significantly changed or progressed as compared to prior MRI 06/06/2023.  Chronic compression deformities involving T12 and L1 vertebral bodies with sequela of prior vertebral augmentation stable.  MRI lumbar spine showed acute compression fractures involving the inferior endplate of L2 with up to 60% height loss without significant bony retropulsion.  Additional chronic compression deformities of all of the T12, L1, L3, L4 and L5 vertebral bodies otherwise stable.  Left foraminal to extraforaminal disc protrusion at L3-4, potentially affecting the exiting left L3 nerve root.  There was a 3.7 cm intra-abdominal aortic aneurysm recommendations of follow-up every 2 years.  Underwent image guided L2 vertebral augmentation kyphoplasty technique 07/08/2023 per Dr. Blinda Burger of interventional radiology.  Hospital course follow-up oncology services Dr. Maria Shiner in regards to neutropenia he did receive a trial of Neupogen  with latest WBC 7.4.  Patient remains on chronic Eliquis  for [name] of atrial fibrillation.  Hyponatremia improved 130-134 133 maintained on sodium chloride  tablets.  Palliative care was consulted to establish goals of care.  Pain management with the use of Durogesic patch/lighted Derm patch with Robaxin  as needed and Dilaudid  3 mg every 4 hours breakthrough pain as needed.  Therapy evaluations completed due to patient's decreased functional mobility was admitted for a comprehensive rehab program. Complains of LLE weakness.   Medical  [name]:   Past Medical [name]:  Diagnosis Date   Arthritis    Rheumatoid   Bacteremia due to Pseudomonas    C. difficile colitis    Calculus of GB and bile duct w ac and chr cholecyst w obst 06/08/2023   Chronic anxiety 11/08/2014   Cough 09/22/2021   COVID 2020   mild case   Depression    Drug-induced neutropenia (HCC) 08/11/2020   Family [name] of adverse reaction to anesthesia    brother with seizures had episode under anesthesia.  Patient has seizures as well.   GERD (gastroesophageal reflux disease)    [name] of radiation therapy 04/24/2020-05/16/2020   IMRT to right lung     Dr Retta Caster   [name] of radiation therapy    08/10/21-08/19/21-Dr. Retta Caster   [name] of radiation therapy    Abdomen- 03/21/23-03/31/23-Dr. Retta Caster   Neutropenia [facility]) 05/12/2020   Neutropenic fever (HCC) 05/12/2020   Normocytic anemia 04/18/2021   PAF (paroxysmal atrial fibrillation) (HCC)    CHADS2VSAC score 3   Rheumatoid aortitis    Secondary hypercoagulable state (HCC) 10/28/2020   Sepsis (HCC) 10/06/2020   Shortness of breath 12/18/2020   Squamous cell lung cancer [facility])          Patient Active Problem List   Diagnosis Date Noted   Lumbar radiculopathy 07/20/2023   S/P kyphoplasty - L2 on 07-08-2023 07/09/2023   High risk medication use 07/08/2023   Drug-induced constipation  07/08/2023   Difficulty coping 07/07/2023   Counseling and coordination of care 07/07/2023   Squamous cell carcinoma of lung (HCC) 07/07/2023   Medication management 07/07/2023   Cancer associated pain 07/07/2023   Palliative care encounter 07/07/2023   Acute on chronic respiratory failure with hypoxia (HCC) 07/02/2023   Pain of metastatic malignancy 07/02/2023   Pancytopenia (HCC) 06/08/2023   Back pain 06/06/2023   Hyperthyroidism 03/31/2023   GERD (gastroesophageal reflux disease) 12/07/2021   Intractable back pain 08/24/2021   T12 compression fracture (HCC) 08/23/2021   Mixed hyperlipidemia  08/02/2021   Coronary artery disease involving native coronary artery of native heart without angina pectoris 08/02/2021   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) 07/29/2021   Obesity, Class I, BMI 30-34.9 07/27/2021   Chronic respiratory failure with hypoxia (HCC) 04/14/2021   Hypercoagulable state due to persistent atrial fibrillation (HCC) 10/28/2020   Malignant neoplasm of lung (HCC)    RA (rheumatoid arthritis) (HCC) 10/06/2020   PAF (paroxysmal atrial fibrillation) (HCC) 08/25/2020   Drug-induced neutropenia (HCC) 08/11/2020   SIADH (syndrome of inappropriate ADH production) (HCC)    Hyponatremia 05/12/2020   Occlusion of right pulmonary artery (HCC) 05/12/2020   Neutropenia (HCC) 05/12/2020   Stage 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 (HCC) 04/10/2020   COPD with asthma (HCC) 03/24/2020   Chronic anxiety 11/08/2014   Petit mal seizure status (HCC) 11/08/2014   Age-related nuclear cataract of left eye 08/12/2014    Behavioral Observation/Mental Status:   John [name]  presents as a 63 y.o.-year-old Right handed Caucasian Male who appeared his stated age. his dress was Appropriate and he was Well Groomed and his manners were Appropriate to the situation.  his participation was indicative of Appropriate behaviors.  There were physical disabilities noted.  he displayed an appropriate level of cooperation and motivation.    Interactions:    Active Appropriate  Attention:   within normal limits and attention span and concentration were age appropriate  Memory:   within normal limits; recent and remote memory intact  Visuo-spatial:   not examined  Speech (Volume):  normal  Speech:   normal; normal  Thought Process:  Coherent and Relevant  Coherent, Linear, and Logical  Though Content:  WNL; not suicidal and not homicidal  Orientation:   person, place, time/date, and  situation  Judgment:   Good  Planning:   Good  Affect:    Appropriate  Mood:    Euthymic  Insight:   Good  Intelligence:   high  Psychiatric [name]:  John [name]   Family Med/Psych [name]:  Family [name]  Problem Relation Age of Onset   Arrhythmia Mother        has PPM   Heart disease Father        Died at 37, started in his 41s, heart attacks, had PPM and ICD    Impression/DX:   John [name] is a 63 year old male referred for neuropsychological consultation during his admission to the comprehensive inpatient rehabilitation unit due to coping and adjustment issues with extensive complicated medical [name].  Patient has had significant back difficulties and back pain, COPD and stage IV metastatic squamous cell carcinoma of right upper lobe on chemotherapy.  Patient presented on 07/02/2023 to [facility] with increasing back pain and shortness of breath.  Patient did need oxygen support with saturations of 88% prior to being placed on 6 L oxygen.  Patient with evolving subacute compression fractures and chronic compression deformities in lower  thoracic and upper lumbar region with image-guided L2 vertebral augmentation kyphoplasty technique by Dr. Mabel Savage of IR.  Patient is continuing to be seen by Dr. Ennever/oncology.  Patient admitted to CIR due to decreased functional mobility.  During today's clinical visit the patient was awake and alert with good cognition.  He was well versed  in his medical status and has been dealing with complicated medical issues for some time.  Patient was very observant of various aspects to different units of the hospital and different specialty services that he has been around and felt like he was being well taking care of overall.  Patient was motivated to participate in therapeutic interventions but is aware of his complicated medical status.  Patient described good comfort with care he is getting through oncology services and in particular  Dr. Maria Shiner and will continue with them postdischarge.  Today we worked on coping and adjustment issues as the patient has had considerable stressors to deal with.  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  The patient acknowledged the emotional impact of his metastatic cancer but also is well aware of the efforts and progress that has been made to keep him alive over the past several years.  Disposition/Plan:  Today we worked on coping and adjustment strategies particularly in finding some other strategies besides external focus on linear problem-solving to augment his current coping style.          Electronically Signed   _______________________ John [name], Psy.D. Clinical Neuropsychologist

## 2023-07-26 NOTE — Progress Notes (Signed)
 Physical Therapy Session Note  Patient Details  Name: John [name] MRN: [id number] Date of Birth: 22-Aug-1960  Today's Date: 07/26/2023 PT Individual Time: 1400-1500 PT Individual Time Calculation (min): 60 min   Short Term Goals: Week 1:  PT Short Term Goal 1 (Week 1): = LTG Week 2:     Skilled Therapeutic Interventions/Progress Updates:    Pt wheeled self to gym today, PT pulling oxygen.  NuStep level 1 x10 minutes.  Pt required rest break.  SPO2 86% Standing balance/endurance tx adding ball toss x2' before fatigue.  SPO2 80% with recovery time ~ 10' to 87%  HR 109. Pt wheeled self to gym, pt able to demonstrate appropriate w/c function indep.  Pt transfers sit<->stand with indep/supervision.  Pt is making steady gains towards goals.   Therapy Documentation Precautions:  Precautions Precautions: Back, Fall Precaution Booklet Issued: Yes (comment) Recall of Precautions/Restrictions: Intact Precaution/Restrictions Comments: educated on precautions, able to state 3/3 Restrictions Weight Bearing Restrictions Per Provider Order: No    Vital Signs: Therapy Vitals Temp: 98 F (36.7 C) Pulse Rate: 100 Resp: 18 BP: 103/71 Patient Position (if appropriate): Sitting Oxygen Therapy SpO2: 91 % O2 Device: Room Air Pain: denies pain         Therapy/Group: Individual Therapy  [name] 07/26/2023, 4:27 PM

## 2023-07-26 NOTE — Progress Notes (Signed)
 Physical Therapy Session Note  Patient Details  Name: John [name] MRN: [id number] Date of Birth: 1960-12-15  Today's Date: 07/26/2023 PT Individual Time: 1300-1330 PT Individual Time Calculation (min): 30 min   Short Term Goals: Week 1:  PT Short Term Goal 1 (Week 1): = LTG  Skilled Therapeutic Interventions/Progress Updates:    Session focused on functional mobility retraining and overall endurance/strengthening. Pt performed basic transfers with supervision with rollator throughout session. Overall supervision to occasional CGA for turning with rollator with shorter distances for self pacing x 50', x 60', x 60', x 50' and cues for not talking after exertion.   Vitals remained WFL with O2 > 87% after mobility trials. Best at 95% with recovery with cues for breathing technique and not to talk while catching his breath.  HR with activity 95-113 bpm  Mini squats x 3 reps of 2 sets with seated break between trials. D/c planning discussed during rest breaks.  Therapy Documentation Precautions:  Precautions Precautions: Back, Fall Precaution Booklet Issued: Yes (comment) Recall of Precautions/Restrictions: Intact Precaution/Restrictions Comments: educated on precautions, able to state 3/3 Restrictions Weight Bearing Restrictions Per Provider Order: No  Vital Signs: 4L O2 via [place]  Pain:  Denies pain.    Therapy/Group: Individual Therapy  Gita Lamb Amadeo June, PT, DPT, CBIS  07/26/2023, 2:19 PM

## 2023-07-26 NOTE — Progress Notes (Addendum)
 Occupational Therapy Session Note  Patient Details  Name: John [name] MRN: [id number] Date of Birth: [birth year]  Today's Date: 07/26/2023 OT Individual Time: 7829-5621 OT Individual Time Calculation (min): 55 min   Short Term Goals: Week 1:  OT Short Term Goal 1 (Week 1): STG=LTG d/t ELOS  Skilled Therapeutic Interventions/Progress Updates:  Pt greeted resting in bed for skilled OT session with focus on BADL retraining, functional transfers, and activity tolerance.   Pain: Pt with no reports of pain. OT offering intermediate rest breaks and positioning suggestions throughout session to address potential pain/fatigue and maximize participation/safety in session.   Functional Transfers: Sit<>stands and ambulatory toilet transfer with supervision + rollator, assistance provided for O2 tank management.   Self Care Tasks: 3/3 toileting tasks with distant supervision.   Therapeutic Exercise: Pt instructed in series of BUE strengthening exercises for carryover into ADL/IADL participation. Details below: Overhead press Chest press Superset of overhead press into chest press Bicep curls  Pt completes 3x10-12 reps with 4# medicine ball, extended rest-breaks provided for O2 stat recovery. Lowest drop to 82 spo2, maintained on 4L O2 HFNC.    Of note, pt with improved awareness of activity tolerance limitations in comparison to previous session with this clinician. And overall improved carryover of energy conservation techniques.   Pt remained sitting in recliner with 4Ps assessed and immediate needs met. Pt continues to be appropriate for skilled OT intervention to promote further functional independence in ADLs/IADLs.   Therapy Documentation Precautions:  Precautions Precautions: Back, Fall Precaution Booklet Issued: Yes (comment) Recall of Precautions/Restrictions: Intact Precaution/Restrictions Comments: educated on precautions, able to state 3/3 Restrictions Weight Bearing  Restrictions Per Provider Order: No   Therapy/Group: Individual Therapy  Artemus Biles, OTR/L, MSOT  07/26/2023, 6:19 AM

## 2023-07-26 NOTE — Progress Notes (Signed)
 Patient ID: John [name], male   DOB: 1960-12-21, 63 y.o.   MRN: [id number]  Met with pt to give team conference update regarding goals of supervision-mod/I and discharge 6/14. He has home O2 so no needs there and will get tub bench on own. Have made referral to [employer] for wheelchair and aware may have a co-pay. Also looking for home health agency to take his [employer]. Pt is in agreement with plan and pleased with progress here.

## 2023-07-26 NOTE — Patient Care Conference (Signed)
 Inpatient RehabilitationTeam Conference and Plan of Care Update Date: 07/26/2023   Time: 1154 am    Patient Name: John [name]      Medical Record Number: [id number]  Date of Birth: January 13, 1961 Sex: Male         Room/Bed: 4W12C/4W12C-02 Payor Info: Payor: [employer] / Plan: C[employer] MANAGED / Product Type: *No Product type* /    Admit Date/Time:  07/20/2023 12:39 PM  Primary Diagnosis:  Lumbar radiculopathy  Hospital Problems: Principal Problem:   Lumbar radiculopathy Active Problems:   Difficulty coping    Expected Discharge Date: Expected Discharge Date: 07/30/23  Team Members Present: Physician leading conference: Dr. Celia Coles Social Worker Present: Adrianna Albee, LCSW Nurse Present: Jerene Monks, RN PT Present: Gita Lamb, PT OT Present: Nila Barth, OT PPS Coordinator present : Jestine Moron, SLP     Current Status/Progress Goal Weekly Team Focus  Bowel/Bladder   Continent of bladder/Bowel   Maintain continence,while on IP Rehab, offer Toileting Q2 HD   Assess Toileting needs,    Swallow/Nutrition/ Hydration               ADL's   SBA overall with increased time for rest breaks, rollator used for transfers/mobility, pt learning how to pace self in activities/ADL tasks   mod i   D/C planning, energy conservating/activity pacing    Mobility   CGA with mobility   Supervision  endurance, strengthening, O2 safety    Communication                Safety/Cognition/ Behavioral Observations               Pain   pain score 0/10 has schedule and unschedule medicationd form   maintain pain at a confortable tolerance per orders and prn   Assess pain QS, encourage patient to take meds    Skin   skin intact   Maintain skin integrity  Assess skin QS      Discharge Planning:  Home with wife who works part time from home and part time out of the home. Pt making progress and will need team's recommendations    Team Discussion: Patient admitted post  kyphoplasty due to lumbar radiculopathy and L2 compression fracture. Patient with chronic hypoxic respiratory failure with high flow oxygen use.     Patient on target to meet rehab goals: yes, currently patient requires SBA with ADLs. Patient requires CGA with transfers and mobility using a rollator . Overall goals at discharge are set for supervision-mod I assistance.   *See Care Plan and progress notes for long and short-term goals.   Revisions to Treatment Plan:  Neuro-psych consult   Teaching Needs: Safety, medications, toileting, transfers, etc.   Current Barriers to Discharge: Decreased caregiver support and Home enviroment access/layout  Possible Resolutions to Barriers: Family education HEP DME:W/C, TTB     Medical Summary Current Status: was bedridden before admission- needs 3-4L O2/hour no pain; conitnent B/B  Barriers to Discharge: Self-care education;Weight bearing restrictions;Morbid Obesity;Oxygen Requirement  Barriers to Discharge Comments: limited by O2 needs; endurance, fatigue- Possible Resolutions to [employer] Focus: goals Supervision to mod I- needs O2 to go home with and wil need W/C_ has rolator- d/c- 6/14   Continued Need for Acute Rehabilitation Level of Care: The patient requires daily medical management by a physician with specialized training in physical medicine and rehabilitation for the following reasons: Direction of a multidisciplinary physical rehabilitation program to maximize functional independence : Yes Medical management of patient stability for  increased activity during participation in an intensive rehabilitation regime.: Yes Analysis of laboratory values and/or radiology reports with any subsequent need for medication adjustment and/or medical intervention. : Yes   I attest that I was present, lead the team conference, and concur with the assessment and plan of the team.   Camerin Ladouceur Gayo 07/26/2023,  1154 am

## 2023-07-26 NOTE — Progress Notes (Signed)
 Occupational Therapy Session Note  Patient Details  Name: John [name] MRN: [id number] Date of Birth: 1961-01-26  Today's Date: 07/26/2023 OT Individual Time: 1030-1130 OT Individual Time Calculation (min): 60 min    Short Term Goals: Week 1:  OT Short Term Goal 1 (Week 1): STG=LTG d/t ELOS  Skilled Therapeutic Interventions/Progress Updates:      Therapy Documentation Precautions:  Precautions Precautions: Back, Fall Precaution Booklet Issued: Yes (comment) Recall of Precautions/Restrictions: Intact Precaution/Restrictions Comments: educated on precautions, able to state 3/3 Restrictions Weight Bearing Restrictions Per Provider Order: No General: "Hi there!" Pt seated in recliner upon OT arrival, agreeable to OT.  Vital Signs: O2 above 85% after entire session  Pain: no pain reported  ADL: OT providing skilled intervention on ADL retraining in order to increase independence with tasks and increase activity tolerance. Pt completed the following tasks at the current level of assist. Before shower, O2 at 88% with 4L through [facility]: Grooming/oral hygiene: set up seated in W/C at sink for shaving and managing hair UB dressing: SBA donning/doffing overhead shirt while seated LB dressing: SBA seated on TTB for donning/doffing overhead shirt Shower transfer: close supervision with stand step transfer from W/C for energy conservation using grab bar, OT assisting with managing O2 tank Bathing: SBA seated on TTB, O2 donned during bathing tasks, seated on TTB for entire shower    OT re-iterating energy conservation techniques during ADL retraining and rest breaks in order to manage O2. OT educating pt on other ways to conserve energy during ADL tasks such as using terrycloth robe to dry off after shower to reduce extra movement.   Pt supine in bed with bed alarm activated, 2 bed rails up, call light within reach and 4Ps assessed.   Therapy/Group: Individual Therapy  Nila Barth, OTD,  OTR/L 07/26/2023, 12:56 PM

## 2023-07-27 ENCOUNTER — Other Ambulatory Visit: Payer: Self-pay

## 2023-07-27 DIAGNOSIS — M5416 Radiculopathy, lumbar region: Secondary | ICD-10-CM | POA: Diagnosis not present

## 2023-07-27 NOTE — Progress Notes (Signed)
 PROGRESS NOTE   Subjective/Complaints:  Pt reports no issues with needing to change O2 at home- usually used 3-4L and already has high flow O2 tubing as well.    Having BM's daily.  Still good- happy with therapy.  ROS:  Per HPI   Pt denies SOB, abd pain, CP, N/V/C/D, and vision changes   Objective:   No results found. No results for input(s): WBC, HGB, HCT, PLT in the last 72 hours.  No results for input(s): NA, K, CL, CO2, GLUCOSE, BUN, CREATININE, CALCIUM  in the last 72 hours.   Intake/Output Summary (Last 24 hours) at 07/27/2023 9147 Last data filed at 07/27/2023 0721 Gross per 24 hour  Intake 1672 ml  Output 1930 ml  Net -258 ml        Physical Exam: Vital Signs Blood pressure 108/74, pulse 86, temperature 97.8 F (36.6 C), temperature source Oral, resp. rate 18, height 5' 11 (1.803 m), weight 103.5 kg, SpO2 94%.      General: awake, alert, appropriate, supine in bed; NAD HENT: conjugate gaze; oropharynx dry- 4L O2 by [place] CV: regular rate and rhythm; no JVD Pulmonary: CTA B/L; no W/R/R- decreased at bases- worse on RLL GI: soft, NT, ND, (+)BS Psychiatric: appropriate Neurological: Ox3   Neurological:     Comments: Patient is alert and oriented x 3.  Follows full commands. Strength is 5/5 except for LLE: 2/5 HF, 4/5 KE , 4/5 DF and PF  Assessment/Plan: 1. Functional deficits which require 3+ hours per day of interdisciplinary therapy in a comprehensive inpatient rehab setting. Physiatrist is providing close team supervision and 24 hour management of active medical problems listed below. Physiatrist and rehab team continue to assess barriers to discharge/monitor patient progress toward functional and medical goals  Care Tool:  Bathing              Bathing assist Assist Level: Contact Guard/Touching assist     Upper Body Dressing/Undressing Upper body dressing    What is the patient wearing?: Pull over shirt    Upper body assist Assist Level: Supervision/Verbal cueing    Lower Body Dressing/Undressing Lower body dressing      What is the patient wearing?: Pants, Underwear/pull up     Lower body assist Assist for lower body dressing: Minimal Assistance - Patient > 75%     Toileting Toileting    Toileting assist Assist for toileting: Contact Guard/Touching assist     Transfers Chair/bed transfer  Transfers assist  Chair/bed transfer activity did not occur: Safety/medical concerns  Chair/bed transfer assist level: Independent with assistive device Chair/bed transfer assistive device: Geologist, engineering   Ambulation assist      Assist level: Independent with assistive device Assistive device: Walker-rolling Max distance: x25'   Walk 10 feet activity   Assist     Assist level: Independent with assistive device Assistive device: Rollator   Walk 50 feet activity   Assist Walk 50 feet with 2 turns activity did not occur: Safety/medical concerns (endurance deficit)  Assist level: Contact Guard/Touching assist Assistive device: Rollator    Walk 150 feet activity   Assist Walk 150 feet activity did not occur: Safety/medical  concerns         Walk 10 feet on uneven surface  activity   Assist Walk 10 feet on uneven surfaces activity did not occur: Safety/medical concerns         Wheelchair     Assist Is the patient using a wheelchair?: Yes Type of Wheelchair: Manual Wheelchair activity did not occur: Safety/medical concerns  Wheelchair assist level: Supervision/Verbal cueing Max wheelchair distance: 49ft    Wheelchair 50 feet with 2 turns activity    Assist    Wheelchair 50 feet with 2 turns activity did not occur: Safety/medical concerns   Assist Level: Dependent - Patient 0%   Wheelchair 150 feet activity     Assist  Wheelchair 150 feet activity did not occur:  Safety/medical concerns   Assist Level: Dependent - Patient 0%   Blood pressure 108/74, pulse 86, temperature 97.8 F (36.6 C), temperature source Oral, resp. rate 18, height 5' 11 (1.803 m), weight 103.5 kg, SpO2 94%.  Medical Problem List and Plan: 1. Functional deficits secondary to left L2 lumbar radiculopathy status post L2 compression fracture with kyphoplasty 07/08/2023             -patient may shower             -ELOS/Goals: 7-10 days modI             Admit to CIR   Con't CIR PT and OT  Team conference today d/c date 6/14- since wife can only pick up Saturday 2.  Antithrombotics: -DVT/anticoagulation:  Pharmaceutical: Eliquis               3. Pain Management: continue Duragesic  patch, Lidoderm  patch, Robaxin  1000 mg every 6 hours as needed, Dilaudid  3 mg every 4 hours as needed   6/5- pt reports pain better controlled, but still taking pain meds frequently- if cannot get improvement in pain in the next few days, will increase Patch and decrease Dilaudid  frequency- very hard to get outpt  Pain ok only took dilaudid  3mg  yesterday and once today thus far   6/9- last dilaudid  yesterday at 1pm- doing so much better- only 2x yesterday  6/10- Last dose of Dilaudid  yesterday afternoon- taking rarely 4. Mood/Behavior/Sleep: Paxil  10 mg nightly             -antipsychotic agents: N/A   5. Neuropsych/cognition: This patient is capable of making decisions on his own behalf.   6. Skin/Wound Care: Routine skin checks 7. Fluids/Electrolytes/Nutrition: Routine in and outs with follow-up chemistries   8.  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  Followed outpatient by Dr. Maria Shiner   6/11- note left by Oncology- getting labs in AM to follow up on Treatment of lung CA 9.  Chronic hypoxic respiratory failure/COPD/remote tobacco use.  Patient on 3 L oxygen nasal cannula prior to admission.  Maintained on prednisone  therapy   6/5- pt reports only used O2 prn unless in  pain/SOB- but on 4L this AM Pt states at home he is usually on 3L but turns it up a little during activity  6/9- will need to go home at 3-4L and can increase for activity if needed  6/10 d/w SW- will get pt's Home O2 4L since that's high flow  6/11- pt reports has high flow tubing at home and used 3-4 L when needed- is fine 10.  Drug-induced neutropenia.  Follow-up hematology services   6/5- WBC 5.3 and Plts 172- will con't to follow 11.  PAF: continue Amiodarone  200 mg daily, Toprol  25 mg daily.  Cardiac rate controlled.  Follow  up cardiology services   6/5-6/6-  appeared in RRR this AM 12.  GERD.  Continue Protonix    13.  Pneumonitis: continue prednisone    6/5- weaning prednisone  14. Hypokalemia  6/5- will replet K+ for K+ of 3.4- with 40 MEq x1    Latest Ref Rng & Units 07/24/2023    7:37 AM 07/21/2023    5:49 AM 07/20/2023    8:21 AM  BMP  Glucose 70 - 99 mg/dL 045  95  409   BUN 8 - 23 mg/dL 12  15  15    Creatinine 0.61 - 1.24 mg/dL 8.11  9.14  7.82   Sodium 135 - 145 mmol/L 136  137  137   Potassium 3.5 - 5.1 mmol/L 3.6  3.4  3.3   Chloride 98 - 111 mmol/L 104  103  102   CO2 22 - 32 mmol/L 27  27  26    Calcium  8.9 - 10.3 mg/dL 8.1  8.5  8.6   Taking Na+ tabs , pt would like to stop but Na+ low normal  6/8 K+ normal  6/9- Na 136- K+ 3.6 yesterday- will decrease Salt tabs to daily with breakfast and recheck labs Thursday 6/11- labs in AM    LOS: 7 days A FACE TO FACE EVALUATION WAS PERFORMED  Manami Tutor 07/27/2023, 8:22 AM

## 2023-07-27 NOTE — Progress Notes (Signed)
 Occupational Therapy Session Note  Patient Details  Name: John [name] MRN: [id number] Date of Birth: 01-15-1961  Today's Date: 07/27/2023 OT Individual Time: 1430-1530 & 1430-1530 OT Individual Time Calculation (min): 60 min & 60 min   Short Term Goals: Week 1:  OT Short Term Goal 1 (Week 1): STG=LTG d/t ELOS  Skilled Therapeutic Interventions/Progress Updates:      Therapy Documentation Precautions:  Precautions Precautions: Back, Fall Precaution Booklet Issued: Yes (comment) Recall of Precautions/Restrictions: Intact Precaution/Restrictions Comments: educated on precautions, able to state 3/3 Restrictions Weight Bearing Restrictions Per Provider Order: No Session 1 General: "Hey there!" Pt supine in bed upon OT arrival, agreeable to OT session.  Vital Signs: O2 above 82% for entire session  Pain:  no pain reported  ADL: OT providing skilled intervention on ADL retraining in order to increase independence with tasks and increase activity tolerance. Pt completed the following tasks at the current level of assist: Bed mobility: SBA with supine>EOB from flat bed with use of bed rails Toilet transfer: SBA with rollator ambulating with no LOB, OT assisting with managing O2 tank Toileting: SBA fr BM, able to complete 3/3 steps of toileting  Exercises: Pt issued UE theraband HEP in order to increase functional strength, andurance and activity tolerance in order to increase independence in ADLs such as bathing. Pt issued yellow theraband and discussed direction/technique of exercises, demonstrating verbal understanding. Pt completed exercises listed below: - Seated Hip Abduction  - 3 sets - 10 reps - Seated March  - 3 sets - 10 reps - Seated Elbow Extension with Self-Anchored Resistance  - 3 sets - 10 reps - Sit to Stand with Armchair  - 1 sets - 5 reps - Small Range Straight Leg Raise  - 3 sets - 10 reps  OT reiterating importance of pacing and rest breaks throughout HEP in  order to maintain O2 levels.    Pt seated in recliner at end of session with all needs met.   Session 2 General:  Pt seated in recliner upon OT arrival, agreeable to OT.  Pain: no pain reported  ADL: OT providing skilled intervention for ADL/IADL tasks in ADL suite. OT instructing patient on finding items throughout cabinets, fridge, and pantry at various heights and reaching out of BOS up/down within spinal precautions. Pt able to complete task at SBA with VC for safety with locking chair before standing when completing ADL tasks and correct angles to take when opening fridge/cabinets. OT educating patient on kitchen safety while patient completing task.  Exercises: OT instructing pt to propel W/C from ADL suite to room with BLE in order to increase strength/endurance of BLE and cardiovascular endurance. Pt able to complete task with increased time and pacing for O2 management with success.   Pt supine in bed with bed alarm activated, 2 bed rails up, call light within reach and 4Ps assessed.   Therapy/Group: Individual Therapy  Nila Barth, OTD, OTR/L 07/27/2023, 4:06 PM

## 2023-07-27 NOTE — Progress Notes (Signed)
 Physical Therapy Session Note  Patient Details  Name: John [name] MRN: [id number] Date of Birth: May 20, 1960  Today's Date: 07/27/2023 PT Individual Time: 1300-1400 PT Individual Time Calculation (min): 60 min   Short Term Goals: Week 1:  PT Short Term Goal 1 (Week 1): = LTG Week 2:    Week 3:     Skilled Therapeutic Interventions/Progress Updates:  Pt states he purchased a recumbent exercise machine, new bed and recliner for upcoming discharge to home.    Functional mobility retraining and endurance/strengthening. Pt performed basic transfers with supervision with rollator throughout session including toileting. Overall supervision with rollator.  Pt ambulate x 65, x 40', x 60' and cues for not talking after exertion. Pt taking rest as needed.    Vitals post tx: first trial 84% with recover to 88% within 1 minute; second trial 80% with recovery to 90% within 2 minutes; third trial 82% with recovery to 92% 4 minutes.  PT discussed with pt cues for breathing technique and not to talk while catching his breath. HR with activity 95-124 bpm. Pt returned to room via w/c and performed LE exercises in recliner: heel slides, ankle DF/PF.    Therapy Documentation Precautions:  Precautions Precautions: Back, Fall Precaution Booklet Issued: Yes (comment) Recall of Precautions/Restrictions: Intact Precaution/Restrictions Comments: educated on precautions, able to state 3/3 Restrictions Weight Bearing Restrictions Per Provider Order: No    Pain: Pt denies pain.        Therapy/Group: Individual Therapy  [name] 07/27/2023, 3:30 PM

## 2023-07-27 NOTE — Progress Notes (Signed)
 [name] certainly looks quite good.  I know that he is definitely getting a lot of therapy.  I know he is excited about this.  He really is motivated.  It sounds like he might be able to go home over the weekend.  He says his left leg is getting better.  He does have some pain in the leg after working it out.  However, he says the pain medication does seem to be effective.  He is eating well.  He is going to the bathroom without difficulty.  He has had no bleeding.  He has had no fever.  He has had no nausea or vomiting.  We will going get some labs checked on him tomorrow.  He just is very pleased with the care that he is getting on the floor.  He really is motivated.  He is trying his best.  Vital signs show temperature 97.8.  Pulse 86.  Blood pressure 108/74.  Head neck exam shows no ocular or oral lesions.  There is no thrush.  Lungs are pretty clear bilaterally.  He has decent air movement bilaterally.  Cardiac exam regular rate and rhythm.  He has occasional extra beat.  Abdomen soft.  Bowel sounds are present.  He has no fluid wave.  There is no palpable liver or spleen tip.  Extremities shows no clubbing, cyanosis or edema.  He has better range of motion of the left hip.  Neurological exam is nonfocal.   Mr. Mastrangelo has metastatic squamous cell carcinoma of the lung.  He has been on treatment.  He has not had treatment for a while secondary to this compression fracture in the back that is not malignant but secondary to steroids.  Thankfully, he is able to get kyphoplasty.  This is helped tremendously.  He now is getting physical therapy on a daily basis to help improve his functional state and his independence.  Again, sound like he will go home this weekend.  We will see what his labs look like tomorrow.  I really do appreciate the great care he is getting from all the staff up on 4 [address].  Rayleen Cal, MD  Hebrew[place] 4:12

## 2023-07-28 ENCOUNTER — Other Ambulatory Visit (HOSPITAL_COMMUNITY): Payer: Self-pay

## 2023-07-28 DIAGNOSIS — M5416 Radiculopathy, lumbar region: Secondary | ICD-10-CM | POA: Diagnosis not present

## 2023-07-28 LAB — LACTATE DEHYDROGENASE: LDH: 166 U/L (ref 98–192)

## 2023-07-28 LAB — CBC WITH DIFFERENTIAL/PLATELET
Abs Immature Granulocytes: 0.02 10*3/uL (ref 0.00–0.07)
Basophils Absolute: 0 10*3/uL (ref 0.0–0.1)
Basophils Relative: 1 %
Eosinophils Absolute: 0.2 10*3/uL (ref 0.0–0.5)
Eosinophils Relative: 4 %
HCT: 35.1 % — ABNORMAL LOW (ref 39.0–52.0)
Hemoglobin: 12 g/dL — ABNORMAL LOW (ref 13.0–17.0)
Immature Granulocytes: 0 %
Lymphocytes Relative: 12 %
Lymphs Abs: 0.7 10*3/uL (ref 0.7–4.0)
MCH: 32.3 pg (ref 26.0–34.0)
MCHC: 34.2 g/dL (ref 30.0–36.0)
MCV: 94.4 fL (ref 80.0–100.0)
Monocytes Absolute: 0.6 10*3/uL (ref 0.1–1.0)
Monocytes Relative: 11 %
Neutro Abs: 4 10*3/uL (ref 1.7–7.7)
Neutrophils Relative %: 72 %
Platelets: 144 10*3/uL — ABNORMAL LOW (ref 150–400)
RBC: 3.72 MIL/uL — ABNORMAL LOW (ref 4.22–5.81)
RDW: 17.3 % — ABNORMAL HIGH (ref 11.5–15.5)
WBC: 5.6 10*3/uL (ref 4.0–10.5)
nRBC: 0 % (ref 0.0–0.2)

## 2023-07-28 LAB — COMPREHENSIVE METABOLIC PANEL WITH GFR
ALT: 23 U/L (ref 0–44)
AST: 19 U/L (ref 15–41)
Albumin: 3 g/dL — ABNORMAL LOW (ref 3.5–5.0)
Alkaline Phosphatase: 55 U/L (ref 38–126)
Anion gap: 8 (ref 5–15)
BUN: 10 mg/dL (ref 8–23)
CO2: 27 mmol/L (ref 22–32)
Calcium: 8.7 mg/dL — ABNORMAL LOW (ref 8.9–10.3)
Chloride: 105 mmol/L (ref 98–111)
Creatinine, Ser: 0.98 mg/dL (ref 0.61–1.24)
GFR, Estimated: >60 mL/min (ref >60)
Glucose, Bld: 104 mg/dL — ABNORMAL HIGH (ref 70–99)
Potassium: 3.7 mmol/L (ref 3.5–5.1)
Sodium: 140 mmol/L (ref 135–145)
Total Bilirubin: 0.7 mg/dL (ref 0.0–1.2)
Total Protein: 5.5 g/dL — ABNORMAL LOW (ref 6.5–8.1)

## 2023-07-28 MED ORDER — LIDOCAINE 5 % EX PTCH
1.0000 | MEDICATED_PATCH | CUTANEOUS | 0 refills | Status: DC
  Filled 2023-07-28: qty 30, 30d supply, fill #0

## 2023-07-28 MED ORDER — VITAMIN D (ERGOCALCIFEROL) 1.25 MG (50000 UNIT) PO CAPS
50000.0000 [IU] | ORAL_CAPSULE | ORAL | 0 refills | Status: AC
  Filled 2023-07-28: qty 5, 34d supply, fill #0

## 2023-07-28 MED ORDER — PAROXETINE HCL 10 MG PO TABS
10.0000 mg | ORAL_TABLET | Freq: Every day | ORAL | 0 refills | Status: DC
  Filled 2023-07-28: qty 30, 30d supply, fill #0

## 2023-07-28 MED ORDER — METHOCARBAMOL 500 MG PO TABS
1000.0000 mg | ORAL_TABLET | Freq: Four times a day (QID) | ORAL | 0 refills | Status: AC | PRN
Start: 2023-07-28 — End: ?
  Filled 2023-07-28: qty 120, 15d supply, fill #0

## 2023-07-28 MED ORDER — AMIODARONE HCL 200 MG PO TABS
200.0000 mg | ORAL_TABLET | Freq: Every day | ORAL | 0 refills | Status: AC
Start: 2023-07-28 — End: ?
  Filled 2023-07-28: qty 30, 30d supply, fill #0

## 2023-07-28 MED ORDER — TRAMADOL HCL 50 MG PO TABS
100.0000 mg | ORAL_TABLET | Freq: Four times a day (QID) | ORAL | 0 refills | Status: AC | PRN
  Filled 2023-07-28: qty 30, 4d supply, fill #0

## 2023-07-28 MED ORDER — TRAMADOL HCL 50 MG PO TABS: 100.0000 mg | ORAL_TABLET | Freq: Four times a day (QID) | ORAL | Status: DC | PRN

## 2023-07-28 MED ORDER — PANTOPRAZOLE SODIUM 40 MG PO TBEC
40.0000 mg | DELAYED_RELEASE_TABLET | Freq: Every day | ORAL | 0 refills | Status: DC
  Filled 2023-07-28: qty 30, 30d supply, fill #0

## 2023-07-28 MED ORDER — FENTANYL 25 MCG/HR TD PT72
1.0000 | MEDICATED_PATCH | TRANSDERMAL | 0 refills | Status: DC
Start: 2023-07-30 — End: 2023-12-09
  Filled 2023-07-28 (×2): qty 5, 15d supply, fill #0

## 2023-07-28 MED ORDER — HYDROMORPHONE HCL 2 MG PO TABS
3.0000 mg | ORAL_TABLET | ORAL | 0 refills | Status: DC | PRN
  Filled 2023-07-28: qty 30, 4d supply, fill #0

## 2023-07-28 MED ORDER — METOPROLOL SUCCINATE ER 25 MG PO TB24
12.5000 mg | ORAL_TABLET | Freq: Every day | ORAL | 0 refills | Status: AC
  Filled 2023-07-28: qty 15, 30d supply, fill #0

## 2023-07-28 MED ORDER — HYDROMORPHONE HCL 2 MG PO TABS
3.0000 mg | ORAL_TABLET | ORAL | Status: DC | PRN
  Administered 2023-07-30: 3 mg via ORAL
  Filled 2023-07-28: qty 2

## 2023-07-28 MED ORDER — APIXABAN 5 MG PO TABS
5.0000 mg | ORAL_TABLET | Freq: Two times a day (BID) | ORAL | 0 refills | Status: DC
Start: 2023-07-28 — End: 2023-12-14
  Filled 2023-07-28: qty 60, 30d supply, fill #0

## 2023-07-28 MED ORDER — PREDNISONE 5 MG PO TABS
5.0000 mg | ORAL_TABLET | Freq: Every day | ORAL | 0 refills | Status: DC
  Filled 2023-07-28: qty 30, 30d supply, fill #0

## 2023-07-28 MED ORDER — BENZONATATE 200 MG PO CAPS
200.0000 mg | ORAL_CAPSULE | Freq: Three times a day (TID) | ORAL | 0 refills | Status: DC | PRN
  Filled 2023-07-28: qty 45, 15d supply, fill #0

## 2023-07-28 NOTE — Progress Notes (Signed)
 Occupational Therapy Discharge Summary  Patient Details  Name: John [name] MRN: [id number] Date of Birth: 04/25/1960  Date of Discharge from OT service:July 29, 2023    Patient has met 8 of 8 long term goals due to improved activity tolerance, improved balance, postural control, and ability to compensate for deficits.  Patient to discharge at overall Modified Independent level.  Patient's care partner is independent to provide the necessary physical assistance at discharge.    Reasons goals not met: All goals met  Recommendation:  Patient will benefit from ongoing skilled OT services in home health setting to continue to advance functional skills in the area of BADL, iADL, and Reduce care partner burden.  Equipment: No equipment provided  Reasons for discharge: treatment goals met and discharge from hospital  Patient/family agrees with progress made and goals achieved: Yes  OT Discharge Precautions/Restrictions  Precautions Precautions: Back;Fall Recall of Precautions/Restrictions: Intact Precaution/Restrictions Comments: educated on precautions, able to state 3/3 Restrictions Weight Bearing Restrictions Per Provider Order: No ADL ADL Equipment Provided: Reacher, Long-handled sponge Eating: Modified independent Where Assessed-Eating: Chair Grooming: Modified independent Where Assessed-Grooming: Chair, Sitting at sink Upper Body Bathing: Modified independent Where Assessed-Upper Body Bathing: Shower Lower Body Bathing: Modified independent Where Assessed-Lower Body Bathing: Shower Upper Body Dressing: Modified independent (Device) Where Assessed-Upper Body Dressing: Edge of bed Lower Body Dressing: Modified independent Where Assessed-Lower Body Dressing: Edge of bed Toileting: Modified independent Where Assessed-Toileting: Toilet, Bedside Commode (BSC over toilet) Toilet Transfer: Modified independent Toilet Transfer Method: Proofreader:  Gaffer: Modified independent Web designer Method: Ship broker: Walk in shower, Emergency planning/management officer, Acupuncturist: Modified independent Film/video editor Method: Designer, industrial/product: Emergency planning/management officer, Grab bars ADL Comments: pt with increased ability to pace self through activities and apply energy conservation techniques Vision Baseline Vision/History: 1 Wears glasses Wears Glasses: Reading only Patient Visual Report: No change from baseline Vision Assessment?: No apparent visual deficits Perception  Perception: Within Functional Limits Praxis Praxis: WFL Cognition Cognition Overall Cognitive Status: Within Functional Limits for tasks assessed Arousal/Alertness: Awake/alert Orientation Level: Person;Place;Situation Person: Oriented Place: Oriented Situation: Oriented Memory: Appears intact Awareness: Appears intact Problem Solving: Appears intact Safety/Judgment: Appears intact Brief Interview for Mental Status (BIMS) Repetition of Three Words (First Attempt): 3 Temporal Orientation: Year: Correct Temporal Orientation: Month: Accurate within 5 days Temporal Orientation: Day: Correct Recall: Sock: Yes, no cue required Recall: Blue: Yes, no cue required Recall: Bed: Yes, no cue required BIMS Summary Score: 15 Sensation Sensation Light Touch: Appears Intact Hot/Cold: Appears Intact Proprioception: Appears Intact Stereognosis: Not tested Coordination Gross Motor Movements are Fluid and Coordinated: No Fine Motor Movements are Fluid and Coordinated: No Coordination and Movement Description: global weakness, although improves since eval Motor  Motor Motor: Within Functional Limits Mobility  Bed Mobility Bed Mobility: Supine to Sit;Sit to Supine Supine to Sit: Independent Sit to Supine: Independent Transfers Sit to Stand: Independent with assistive device Stand  to Sit: Independent with assistive device  Trunk/Postural Assessment  Cervical Assessment Cervical Assessment: Within Functional Limits Thoracic Assessment Thoracic Assessment: Within Functional Limits Lumbar Assessment Lumbar Assessment: Within Functional Limits Postural Control Postural Control: Within Functional Limits  Balance Balance Balance Assessed: Yes Static Sitting Balance Static Sitting - Balance Support: Feet supported;No upper extremity supported Static Sitting - Level of Assistance: 6: Modified independent (Device/Increase time) Dynamic Sitting Balance Dynamic Sitting - Balance Support: During functional activity Dynamic Sitting - Level of Assistance: 6: Modified independent (  Device/Increase time) Cytogeneticist Standing - Balance Support: During functional activity Static Standing - Level of Assistance: 6: Modified independent (Device/Increase time) Dynamic Standing Balance Dynamic Standing - Balance Support: During functional activity Dynamic Standing - Level of Assistance: 6: Modified independent (Device/Increase time) Dynamic Standing - Balance Activities: Reaching for objects Extremity/Trunk Assessment RUE Assessment RUE Assessment: Within Functional Limits LUE Assessment LUE Assessment: Within Functional Limits   Nila Barth, OTD, OTR/L 07/28/2023, 4:13 PM

## 2023-07-28 NOTE — Progress Notes (Signed)
 Physical Therapy Session Note  Patient Details  Name: John [name] MRN: [id number] Date of Birth: 1960-06-28  Today's Date: 07/28/2023 PT Individual Time-: 1445-1530  PT Individual Time Calculation (min): 45 min  Short Term Goals: Week 1:  PT Short Term Goal 1 (Week 1): = LTG  Skilled Therapeutic Interventions/Progress Updates: Pt presented in bed agreeable to therapy. Pt denies pain during session. Session focused on cardiovascular endurance via use of NuStep. At rest pt maintained SpO2 90% 4L. Pt completed stand step transfer to w/c for energy conservation with close supervision. Pt transported to day room and completed stand step transfer to NuStep, SpO2 80% on 4L, required several minutes for recovery to 90%. Pt then participated in NuStep L4 pace partner avg 60SPM for with intermittent SpO2 checks. Pt initially maintained O2 then at end noted SpO2 83% requiring ~5 in for recovery. Once completed pt completed stand step transfer back to w/c and pt transported back to room for energy conservation. Completed stand step transfer with supervision to bed and completed sit to supine with supervision. Pt left in bed at end of session with call bell within reach and needs met.     Therapy Documentation Precautions:  Precautions Precautions: Back, Fall Precaution Booklet Issued: Yes (comment) Recall of Precautions/Restrictions: Intact Precaution/Restrictions Comments: educated on precautions, able to state 3/3 Restrictions Weight Bearing Restrictions Per Provider Order: No General:   Vital Signs: Therapy Vitals Temp: 98.1 F (36.7 C) Pulse Rate: 93 Resp: 17 BP: 104/62 Patient Position (if appropriate): Lying Oxygen Therapy SpO2: 95 % O2 Device: Nasal Cannula O2 Flow Rate (L/min): 3 L/min Pain: Pain Assessment Pain Scale: 0-10 Pain Score: 0-No pain   Therapy/Group: Individual Therapy  Czarina Gingras 07/28/2023, 9:45 PM

## 2023-07-28 NOTE — Progress Notes (Signed)
 Occupational Therapy Session Note  Patient Details  Name: John [name] MRN: [id number] Date of Birth: 1961-02-15  Today's Date: 07/28/2023 OT Individual Time: 4098-1191 & 1345-1425 OT Individual Time Calculation (min): 75 min & 40 min   Short Term Goals: Week 1:  OT Short Term Goal 1 (Week 1): STG=LTG d/t ELOS  Skilled Therapeutic Interventions/Progress Updates:      Therapy Documentation Precautions:  Precautions Precautions: Back, Fall Precaution Booklet Issued: Yes (comment) Recall of Precautions/Restrictions: Intact Precaution/Restrictions Comments: educated on precautions, able to state 3/3 Restrictions Weight Bearing Restrictions Per Provider Order: No Session 1 General: Pt seated in recliner upon OT arrival, agreeable to OT.   Pain: no pain reported  ADL: OT providing intervention on functional mobility from pt room><day room with seated rest break once arrived in day room. OT providing VC on decreased reliance on UB strength for ambulation   Other Treatments: OT and pt having discussion about mental health, coping mechanisms and potential support programs in area for pt current circumstance. OT providing handout for stress/coping mechanisms. Pt becoming tearful during session, OT providing encouragement and motivational speech/interviewing for increased mood and coping strategies. OT providing information on O2 management within the home and community setting in order to be safe and potentially increase community engagement after D/C such as attending church. Pt discussed potential for wife to attend session tomorrow for training to see progress, OT relaying information to scheduler for afternoon sessions if possible wife can make it.    ]Pt supine in bed with bed alarm activated, 2 bed rails up, call light within reach and 4Ps assessed.   Session 2 General:  Pt supine in bed upon OT arrival, agreeable to OT session.  Vital Signs: O2 above 85% for entire  session  Pain:  no pain reported  ADL: OT provided skilled intervention with ADL tasks for increased independence and pacing strategies bu using energy conservation. Pt completed toileting, bathing and dressing all at SBA level with short bouts of ambulation in between tasks with rollator.   Pt supine in bed with bed alarm activated, 2 bed rails up, call light within reach and 4Ps assessed.   Therapy/Group: Individual Therapy  Nila Barth, OTD, OTR/L 07/28/2023, 4:08 PM

## 2023-07-28 NOTE — Progress Notes (Signed)
 PROGRESS NOTE   Subjective/Complaints:  Pt reports that doing well- Bowels working well Just got inhalers with resp therapy- breathing well on 4L O2 at all times.   Excited for a new day of therapy  ROS:  Per HPI   Pt denies SOB, abd pain, CP, N/V/C/D, and vision changes    Objective:   No results found. Recent Labs    07/28/23 0619  WBC 5.6  HGB 12.0*  HCT 35.1*  PLT 144*    Recent Labs    07/28/23 0619  NA 140  K 3.7  CL 105  CO2 27  GLUCOSE 104*  BUN 10  CREATININE 0.98  CALCIUM  8.7*     Intake/Output Summary (Last 24 hours) at 07/28/2023 0828 Last data filed at 07/28/2023 0720 Gross per 24 hour  Intake 955 ml  Output 2150 ml  Net -1195 ml        Physical Exam: Vital Signs Blood pressure 101/62, pulse 94, temperature 98.3 F (36.8 C), resp. rate 18, height 5' 11 (1.803 m), weight 103.5 kg, SpO2 98%.       General: awake, alert, appropriate, supine in bed; Resp therapy in room; NAD HENT: conjugate gaze; oropharynx dry- 4L O2 in place with high flow tubing CV: regular rate and rhythm; no JVD Pulmonary: CTA B/L; no W/R/R- a little coarse on R side this AM- decreased at bases- R>L GI: soft, NT, ND, (+)BS Psychiatric: appropriate Neurological: Ox3   Neurological:     Comments: Patient is alert and oriented x 3.  Follows full commands. Strength is 5/5 except for LLE: 2/5 HF, 4/5 KE , 4/5 DF and PF  Assessment/Plan: 1. Functional deficits which require 3+ hours per day of interdisciplinary therapy in a comprehensive inpatient rehab setting. Physiatrist is providing close team supervision and 24 hour management of active medical problems listed below. Physiatrist and rehab team continue to assess barriers to discharge/monitor patient progress toward functional and medical goals  Care Tool:  Bathing              Bathing assist Assist Level: Contact Guard/Touching assist      Upper Body Dressing/Undressing Upper body dressing   What is the patient wearing?: Pull over shirt    Upper body assist Assist Level: Supervision/Verbal cueing    Lower Body Dressing/Undressing Lower body dressing      What is the patient wearing?: Pants, Underwear/pull up     Lower body assist Assist for lower body dressing: Minimal Assistance - Patient > 75%     Toileting Toileting    Toileting assist Assist for toileting: Contact Guard/Touching assist     Transfers Chair/bed transfer  Transfers assist  Chair/bed transfer activity did not occur: Safety/medical concerns  Chair/bed transfer assist level: Supervision/Verbal cueing Chair/bed transfer assistive device: Geologist, engineering   Ambulation assist      Assist level: Supervision/Verbal cueing Assistive device: Walker-rolling Max distance: x25'   Walk 10 feet activity   Assist     Assist level: Independent with assistive device Assistive device: Rollator   Walk 50 feet activity   Assist Walk 50 feet with 2 turns activity did not occur: Safety/medical concerns (  endurance deficit)  Assist level: Contact Guard/Touching assist Assistive device: Rollator    Walk 150 feet activity   Assist Walk 150 feet activity did not occur: Safety/medical concerns         Walk 10 feet on uneven surface  activity   Assist Walk 10 feet on uneven surfaces activity did not occur: Safety/medical concerns         Wheelchair     Assist Is the patient using a wheelchair?: Yes Type of Wheelchair: Manual Wheelchair activity did not occur: Safety/medical concerns  Wheelchair assist level: Supervision/Verbal cueing Max wheelchair distance: 16ft    Wheelchair 50 feet with 2 turns activity    Assist    Wheelchair 50 feet with 2 turns activity did not occur: Safety/medical concerns   Assist Level: Dependent - Patient 0%   Wheelchair 150 feet activity     Assist  Wheelchair  150 feet activity did not occur: Safety/medical concerns   Assist Level: Dependent - Patient 0%   Blood pressure 101/62, pulse 94, temperature 98.3 F (36.8 C), resp. rate 18, height 5' 11 (1.803 m), weight 103.5 kg, SpO2 98%.  Medical Problem List and Plan: 1. Functional deficits secondary to left L2 lumbar radiculopathy status post L2 compression fracture with kyphoplasty 07/08/2023             -patient may shower             -ELOS/Goals: 7-10 days modI             Admit to CIR   D/c 6/14- since wife can only pick up Saturday  Con't CIR PT and OT 2.  Antithrombotics: -DVT/anticoagulation:  Pharmaceutical: Eliquis               3. Pain Management: continue Duragesic  patch, Lidoderm  patch, Robaxin  1000 mg every 6 hours as needed, Dilaudid  3 mg every 4 hours as needed   6/5- pt reports pain better controlled, but still taking pain meds frequently- if cannot get improvement in pain in the next few days, will increase Patch and decrease Dilaudid  frequency- very hard to get outpt  Pain ok only took dilaudid  3mg  yesterday and once today thus far   6/9- last dilaudid  yesterday at 1pm- doing so much better- only 2x yesterday  6/10- Last dose of Dilaudid  yesterday afternoon- taking rarely  6/12- pain controlled 4. Mood/Behavior/Sleep: Paxil  10 mg nightly             -antipsychotic agents: N/A   5. Neuropsych/cognition: This patient is capable of making decisions on his own behalf.   6. Skin/Wound Care: Routine skin checks 7. Fluids/Electrolytes/Nutrition: Routine in and outs with follow-up chemistries   8.  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  Followed outpatient by Dr. Maria Shiner   6/11- note left by Oncology- getting labs in AM to follow up on Treatment of lung CA 9.  Chronic hypoxic respiratory failure/COPD/remote tobacco use.  Patient on 3 L oxygen nasal cannula prior to admission.  Maintained on prednisone  therapy   6/5- pt reports only used O2 prn unless  in pain/SOB- but on 4L this AM Pt states at home he is usually on 3L but turns it up a little during activity  6/9- will need to go home at 3-4L and can increase for activity if needed  6/10 d/w SW- will get pt's Home O2 4L since that's high flow  6/11- pt reports has high flow tubing at home and used 3-4 L  when needed- is fine 10.  Drug-induced neutropenia.  Follow-up hematology services   6/5- WBC 5.3 and Plts 172- will con't to follow 11.  PAF: continue Amiodarone  200 mg daily, Toprol  25 mg daily.  Cardiac rate controlled.  Follow  up cardiology services   6/5-6/6-  appeared in RRR this AM 12.  GERD.  Continue Protonix    13.  Pneumonitis: continue prednisone    6/5- weaning prednisone  14. Hypokalemia  6/5- will replet K+ for K+ of 3.4- with 40 MEq x1  6/12- K+ 3.7    Latest Ref Rng & Units 07/28/2023    6:19 AM 07/24/2023    7:37 AM 07/21/2023    5:49 AM  BMP  Glucose 70 - 99 mg/dL 161  096  95   BUN 8 - 23 mg/dL 10  12  15    Creatinine 0.61 - 1.24 mg/dL 0.45  4.09  8.11   Sodium 135 - 145 mmol/L 140  136  137   Potassium 3.5 - 5.1 mmol/L 3.7  3.6  3.4   Chloride 98 - 111 mmol/L 105  104  103   CO2 22 - 32 mmol/L 27  27  27    Calcium  8.9 - 10.3 mg/dL 8.7  8.1  8.5   Taking Na+ tabs , pt would like to stop but Na+ low normal  6/8 K+ normal  6/9- Na 136- K+ 3.6 yesterday- will decrease Salt tabs to daily with breakfast and recheck labs Thursday 6/11- labs in AM 6/12- Na 140- will stop Na tabs since Na has rebounded 15. Thrombocytopenia  6/12- Plts down to 144k- oncology already involved    I spent a total of 35   minutes on total care today- >50% coordination of care- due to  D/w resp therapy as well as nursing for overnight issues - also reviewed labs and vitals and stopped NaCL tabs  LOS: 8 days A FACE TO FACE EVALUATION WAS PERFORMED  Chares Slaymaker 07/28/2023, 8:28 AM

## 2023-07-28 NOTE — Progress Notes (Signed)
 Physical Therapy Session Note  Patient Details  Name: Webb Weed MRN: [id number] Date of Birth: 20-Nov-1960  Today's Date: 07/28/2023 PT Individual Time: 0930-1030 PT Individual Time Calculation (min): 60 min   Short Term Goals: Week 1:  PT Short Term Goal 1 (Week 1): = LTG Week 2:    Week 3:     Skilled Therapeutic Interventions/Progress Updates:  Pt reports he is trying to not take pain medication if he can tolerate it since he won't take dilaudid  when discharged.  Pt in bed when PT arrived, bed mobility, transfers are independent. Pt continues to take breaks during activity due to increased exertion, decreased SPO2.  PT session focusing on endurance, safety. Pt wheeled self to gym ~152ft total with x1 break SPO2 83%, recovered to 88%.  Performed car transfers with supervision and noted increased exertion; SPO2 82%, fluctuating between 83%-85% ~x8-10 minutes before reaching 86%.  Pt c/o LBP during session 6/10.  PT requested pain meds from nursing. Pt seated in recliner at end of session call light within reach.  Pt verbalizes need to discuss pain meds on discharged with MD.      Therapy Documentation Precautions:  Precautions Precautions: Back, Fall Precaution Booklet Issued: Yes (comment) Recall of Precautions/Restrictions: Intact Precaution/Restrictions Comments: educated on precautions, able to state 3/3 Restrictions Weight Bearing Restrictions Per Provider Order: No General:   Vital Signs: Therapy Vitals Temp: 98.1 F (36.7 C) Pulse Rate: 98 Resp: 18 BP: 109/68 Patient Position (if appropriate): Lying Oxygen Therapy SpO2: 94 % O2 Device: Room Air Pain: pt c/o 6/10 pain to back     Therapy/Group: Individual Therapy  [name] 07/28/2023, 3:39 PM

## 2023-07-29 ENCOUNTER — Encounter: Payer: Self-pay | Admitting: Internal Medicine

## 2023-07-29 ENCOUNTER — Other Ambulatory Visit (HOSPITAL_COMMUNITY): Payer: Self-pay

## 2023-07-29 ENCOUNTER — Encounter: Payer: Self-pay | Admitting: Hematology & Oncology

## 2023-07-29 DIAGNOSIS — M5416 Radiculopathy, lumbar region: Secondary | ICD-10-CM | POA: Diagnosis not present

## 2023-07-29 MED ORDER — MINERAL OIL RE ENEM
1.0000 | ENEMA | Freq: Once | RECTAL | Status: AC
  Administered 2023-07-29: 1 via RECTAL
  Filled 2023-07-29: qty 1

## 2023-07-29 NOTE — Progress Notes (Signed)
 Physical Therapy Session Note  Patient Details  Name: John [name] MRN: [id number] Date of Birth: 03/15/1960  Today's Date: 07/29/2023 PT Individual Time: 1135-1200 PT Individual Time Calculation (min): 25 min   Short Term Goals: Week 1:  PT Short Term Goal 1 (Week 1): = LTG   Skilled Therapeutic Interventions/Progress Updates:    Session focused on functional endurance, activity tolerance, transfers, w/c mobility and d/c planning. Pt's personal w/c in room, transferred to w/c mod I and adjusted legrests to fit pt. Pt self propelled w/c for UE strengthening and endurance x 120' Mod I. Pt sats decreased due to limited recovery time between therapy session ~ 83% cues for diaphragmatic breathing and to not talk during recovery and sats increased 87%. Declined gait at this time due to fatigue. Verbally reviewed HEP and d/c education. Transferred back to recliner at end of session with all needs in reach.   Therapy Documentation Precautions:  Precautions Precautions: Back, Fall Precaution Booklet Issued: Yes (comment) Recall of Precautions/Restrictions: Intact Precaution/Restrictions Comments: educated on precautions, able to state 3/3 Restrictions Weight Bearing Restrictions Per Provider Order: No  Pain:  Denies pain.    Therapy/Group: Individual Therapy  Gita Lamb Amadeo June, PT, DPT, CBIS  07/29/2023, 12:28 PM

## 2023-07-29 NOTE — Progress Notes (Signed)
 Looks like everything going quite well for John [name].  I know he is worked incredibly hard.  Hopefully, he will be able to go home tomorrow.  He is very pleased with how well he has come along.  He has gotten a lot of help from the staff.  His appetite is doing okay.  He is having no problems with nausea or vomiting.  He has had no change in bowel or bladder habits.  Is been no bleeding.  We have's had no problems with respect to the atrial fibrillation.  He has had no pain issues.  Hydromorphone  works well for him.  I would just have him on this as an outpatient.  His labs are all looking pretty good.  His white count is 5.6.  Hemoglobin 12 platelet count 144,000.  Sodium 140.  Potassium 3.7.  BUN 10 creatinine 0.98.  Calcium  8.7 with an albumin  of 3.0.  His vital signs are all stable.  Blood pressure 102/74.  Pulse is 79.  His lungs sound relatively clear bilaterally.  He has good air movement bilaterally.  Cardiac exam is regular rate and irregular rhythm.  He has no murmurs.  Abdomen is soft.  He has good bowel sounds.  There is no fluid wave.  There is no palpable liver or spleen tip.  Extremities shows no clubbing, cyanosis or edema.  Neurological exam is nonfocal.  Again, hopefully, John [name] will be able to go home over the weekend.  I know he is looking forward to this.  His back is doing a lot better since he had the kyphoplasty.  We will plan to follow him up in the office about a week or so after he is discharged.  Again, he has had incredible help from all the staff up on 4W.  I know everybody is worked incredibly hard to get John [name] to have a better quality of life.  Rayleen Cal, MD  Lina Render 17:7

## 2023-07-29 NOTE — Progress Notes (Signed)
 Inpatient Rehabilitation Care Coordinator Discharge Note DC SAT 6/14  Patient Details  Name: John [name] MRN: [id number] Date of Birth: 01/01/1961   Discharge location: HOME WITHWIFE WHO IS ABLE TO ASSIST WORKS HYBRID FROM HOME  Length of Stay: 10 DAYS  Discharge activity level: INDEPENDENT WITH DEVICE  Home/community participation: ACTIVE  Patient response XB:MWUXLK Literacy - How often do you need to have someone help you when you read instructions, pamphlets, or other written material from your doctor or pharmacy?: Never  Patient response GM:WNUUVO Isolation - How often do you feel lonely or isolated from those around you?: Rarely  Services provided included: MD, RD, PT, OT, RN, CM, Pharmacy, SW, Neuropsych  Financial Services:  Field seismologist Utilized: Engineer, agricultural offered to/list presented to: PT  Follow-up services arranged:  Home Health, DME, Patient/Family has no preference for HH/DME agencies Home Health Agency: [facility]  PT & OT    DME : [facility]  WHEELCHAIR  GET TUB BENCH ON OWN DUE TO NOT COVERED. HAS HOME O2 ALREADY  Patient response to transportation need: Is the patient able to respond to transportation needs?: Yes In the past 12 months, has lack of transportation kept you from medical appointments or from getting medications?: No In the past 12 months, has lack of transportation kept you from meetings, work, or from getting things needed for daily living?: No   Patient/Family verbalized understanding of follow-up arrangements:  Yes  Individual responsible for coordination of the follow-up plan: SELF [phone]  Confirmed correct DME delivered: John [name]    Comments (or additional information):PT DID WELL AND REACHED HIS GOALS. FELT READY FOR DC.  Summary of Stay    Date/Time Discharge Planning CSW  07/26/23 [phone] Home with wife who works part time from home and part time out of the home. Pt  making progress and will need team's recommendations John [name]       John [name]

## 2023-07-29 NOTE — Addendum Note (Signed)
 Addended by: Margo Shells on: 07/29/2023 05:01 PM   Modules accepted: Orders

## 2023-07-29 NOTE — Progress Notes (Signed)
 Inpatient Rehabilitation Discharge Medication Review by a Pharmacist  A complete drug regimen review was completed for this patient to identify any potential clinically significant medication issues.  High Risk Drug Classes Is patient taking? Indication by Medication  Antipsychotic No   Anticoagulant Yes Apixaban  - Afib  Antibiotic No   Opioid Yes Fentanyl  - pain Tramadol  prn pain  Antiplatelet No   Hypoglycemics/insulin No   Vasoactive Medication Yes Amiodarone , metoprolol - Afib  Chemotherapy No   Other Yes Repatha  - cholesterol Methocarbamol  - prn spasms Benzonatate  prn cough Lidocaine  - pain Pantoprazole  - reflux Paroxetine  - mood Prednisone , Trelegy - COPD Vitamin D  - bones     Type of Medication Issue Identified Description of Issue Recommendation(s)  Drug Interaction(s) (clinically significant)     Duplicate Therapy     Allergy     No Medication Administration End Date     Incorrect Dose     Additional Drug Therapy Needed     Significant med changes from prior encounter (inform family/care partners about these prior to discharge).    Other       Clinically significant medication issues were identified that warrant physician communication and completion of prescribed/recommended actions by midnight of the next day:  No  Name of provider notified for urgent issues identified:   Provider Method of Notification:     Pharmacist comments: None  Time spent performing this drug regimen review (minutes):  20 minutes  Thank you. Lennice Quivers, PharmD

## 2023-07-29 NOTE — Progress Notes (Signed)
 Physical Therapy Discharge Summary  Patient Details  Name: John [name] MRN: [id number] Date of Birth: May 22, 1960  Date of Discharge from PT service:July 29, 2023  Today's Date: 07/29/2023 PT Individual Time: 1030-1130 PT Individual Time Calculation (min): 60 min    Patient has met 8 of 8 long term goals due to improved activity tolerance, improved balance, improved postural control, increased strength, decreased pain, functional use of  left lower extremity, improved attention, improved awareness, and improved coordination.  Patient to discharge at an ambulatory level Supervision.   Patient's care partner is independent to provide the necessary physical assistance at discharge.  Reasons goals not met: N/A  Recommendation:  Patient will benefit from ongoing skilled PT services in home health setting to continue to advance safe functional mobility, address ongoing impairments in cardiorespiratory/endurance/mobility deficits and minimize fall risk.  Equipment: W/C, O2  Reasons for discharge: treatment goals met and discharge from hospital PT individual treatment initiated with focus on endurance, gait with RW, safe transfers, wheelchair mobility, stair negotiation.  Pt demonstrates good gait without deviation, able to self propel to gym. Performed step ups with RW Supervision.  Pt becomes dyspneic and requires to rest periodically.  Pt's SPO2 monitored during activity intermittently, dropped to 83% with recovery of 1-2 minutes to 88%.  Pt able to transfer into recliner from w/c post tx with without cueing and assistive device.  Pt has met goals and is appropriate for dc.   Patient/family agrees with progress made and goals achieved: Yes  PT Discharge Precautions/Restrictions Precautions Precautions: Back;Fall Precaution Booklet Issued: Yes (comment) Recall of Precautions/Restrictions: Intact Restrictions Weight Bearing Restrictions Per Provider Order: No Other Position/Activity  Restrictions: avoiding bending, lifting, twisting    Pain Interference Pain Interference Pain Effect on Sleep: 0. Does not apply - I have not had any pain or hurting in the past 5 days Pain Interference with Therapy Activities: 1. Rarely or not at all Pain Interference with Day-to-Day Activities: 1. Rarely or not at all Vision/Perception     Cognition Overall Cognitive Status: Within Functional Limits for tasks assessed Arousal/Alertness: Awake/alert Orientation Level: Oriented X4 Sensation Sensation Light Touch: Appears Intact Hot/Cold: Appears Intact Proprioception: Appears Intact Stereognosis: Not tested Coordination Gross Motor Movements are Fluid and Coordinated: No Fine Motor Movements are Fluid and Coordinated: No Motor  Motor Motor: Within Functional Limits  Mobility Bed Mobility Bed Mobility: Supine to Sit;Sit to Supine;Right Sidelying to Sit;Sit to Sidelying Right;Rolling Right;Sitting - Scoot to Edge of Bed Rolling Right: Independent Right Sidelying to Sit: Independent Supine to Sit: Independent Sitting - Scoot to Edge of Bed: Independent Sit to Supine: Independent Sit to Sidelying Right: Independent Transfers Transfers: Stand to Sit;Sit to [employer] to Stand: Independent with assistive device Stand to Sit: Independent with assistive device Transfer (Assistive device): Rollator Locomotion  Gait Ambulation: Yes Gait Assistance: Minimal Assistance - Patient > 75% Gait Distance (Feet): 15 Feet Assistive device: Rollator Gait Assistance Details: Visual cues for safe use of DME/AE Gait Gait: Yes Gait Pattern: Within Functional Limits Gait velocity: WFL High Level Ambulation High Level Ambulation: Direction changes;Head turns Direction Changes: pt able to control pace and rollator with directional changes Head Turns: pt able to look all directions without LOB Stairs / Additional Locomotion Stairs: Yes Stairs Assistance: Supervision/Verbal  cueing Stair Management Technique: With walker Number of Stairs: 2 Height of Stairs: 3 Ramp: Supervision/Verbal cueing Curb: Supervision/Verbal cueing Pick up small object from the floor assist level: Supervision/Verbal cueing Pick up small object from the floor  assistive device: Chief Financial Officer: Yes Wheelchair Assistance: Independent with Scientist, research (life sciences): Both upper extremities;Both lower extermities Wheelchair Parts Management: Independent Distance: 150  Trunk/Postural Assessment  Cervical Assessment Cervical Assessment: Within Functional Limits Thoracic Assessment Thoracic Assessment: Within Functional Limits Lumbar Assessment Lumbar Assessment: Within Functional Limits Postural Control Postural Control: Within Functional Limits  Balance Balance Balance Assessed: Yes Static Sitting Balance Static Sitting - Balance Support: Feet supported;No upper extremity supported Static Sitting - Level of Assistance: 6: Modified independent (Device/Increase time) Dynamic Sitting Balance Dynamic Sitting - Balance Support: During functional activity Dynamic Sitting - Level of Assistance: 6: Modified independent (Device/Increase time) Dynamic Sitting Balance - Compensations: none Dynamic Sitting - Balance Activities: Forward lean/weight shifting;Reaching for weighted objects;Reaching for objects Static Standing Balance Static Standing - Balance Support: During functional activity Static Standing - Level of Assistance: 6: Modified independent (Device/Increase time) Static Standing - Comment/# of Minutes: ~3-4 minutes Dynamic Standing Balance Dynamic Standing - Balance Support: During functional activity Dynamic Standing - Level of Assistance: 6: Modified independent (Device/Increase time) Dynamic Standing - Balance Activities: Ball toss Dynamic Standing - Comments: pt able to tolerate up to 2' Extremity Assessment   RLE  Assessment RLE Assessment: Within Functional Limits LLE Assessment LLE Assessment: Exceptions to [facility] Active Range of Motion (AROM) Comments: dorsiflexion limited 25% General Strength Comments: left ankle df 4/5    [name] 07/29/2023, 3:28 PM

## 2023-07-29 NOTE — Progress Notes (Signed)
 PROGRESS NOTE   Subjective/Complaints:  Pt reports no issues- ready for d/c tomorrow.  ROS:  Per HPI  Pt denies SOB, abd pain, CP, N/V/C/D, and vision changes    Objective:   No results found. Recent Labs    07/28/23 0619  WBC 5.6  HGB 12.0*  HCT 35.1*  PLT 144*    Recent Labs    07/28/23 0619  NA 140  K 3.7  CL 105  CO2 27  GLUCOSE 104*  BUN 10  CREATININE 0.98  CALCIUM  8.7*     Intake/Output Summary (Last 24 hours) at 07/29/2023 1714 Last data filed at 07/29/2023 1502 Gross per 24 hour  Intake 716 ml  Output 1560 ml  Net -844 ml        Physical Exam: Vital Signs Blood pressure 107/75, pulse 89, temperature 98.1 F (36.7 C), temperature source Oral, resp. rate 18, height 5' 11 (1.803 m), weight 103.5 kg, SpO2 94%.        General: awake, alert, appropriate, NAD HENT: conjugate gaze; oropharynx moist CV: regular rate; irregular rhythm; no JVD Pulmonary: decreased at bases B/L- R worse than L GI: soft, NT, ND, (+)BS Psychiatric: appropriate- quiet Neurological: Ox3    Neurological:     Comments: Patient is alert and oriented x 3.  Follows full commands. Strength is 5/5 except for LLE: 2/5 HF, 4/5 KE , 4/5 DF and PF  Assessment/Plan: 1. Functional deficits which require 3+ hours per day of interdisciplinary therapy in a comprehensive inpatient rehab setting. Physiatrist is providing close team supervision and 24 hour management of active medical problems listed below. Physiatrist and rehab team continue to assess barriers to discharge/monitor patient progress toward functional and medical goals  Care Tool:  Bathing              Bathing assist Assist Level: Independent with assistive device     Upper Body Dressing/Undressing Upper body dressing   What is the patient wearing?: Pull over shirt    Upper body assist Assist Level: Independent with assistive device    Lower  Body Dressing/Undressing Lower body dressing      What is the patient wearing?: Pants, Underwear/pull up     Lower body assist Assist for lower body dressing: Independent with assitive device     Toileting Toileting    Toileting assist Assist for toileting: Independent with assistive device     Transfers Chair/bed transfer  Transfers assist  Chair/bed transfer activity did not occur: Safety/medical concerns  Chair/bed transfer assist level: Independent with assistive device Chair/bed transfer assistive device: Geologist, engineering   Ambulation assist      Assist level: Supervision/Verbal cueing Assistive device: Walker-rolling Max distance: 15'   Walk 10 feet activity   Assist     Assist level: Independent with assistive device Assistive device: Rollator   Walk 50 feet activity   Assist Walk 50 feet with 2 turns activity did not occur: Safety/medical concerns (endurance deficit)  Assist level: Contact Guard/Touching assist Assistive device: Rollator    Walk 150 feet activity   Assist Walk 150 feet activity did not occur: Safety/medical concerns  Walk 10 feet on uneven surface  activity   Assist Walk 10 feet on uneven surfaces activity did not occur: Safety/medical concerns         Wheelchair     Assist Is the patient using a wheelchair?: Yes Type of Wheelchair: Manual Wheelchair activity did not occur: Safety/medical concerns  Wheelchair assist level: Independent Max wheelchair distance: x150    Wheelchair 50 feet with 2 turns activity    Assist    Wheelchair 50 feet with 2 turns activity did not occur: Safety/medical concerns   Assist Level: Independent   Wheelchair 150 feet activity     Assist  Wheelchair 150 feet activity did not occur: Safety/medical concerns   Assist Level: Independent   Blood pressure 107/75, pulse 89, temperature 98.1 F (36.7 C), temperature source Oral, resp. rate  18, height 5' 11 (1.803 m), weight 103.5 kg, SpO2 94%.  Medical Problem List and Plan: 1. Functional deficits secondary to left L2 lumbar radiculopathy status post L2 compression fracture with kyphoplasty 07/08/2023             -patient may shower             -ELOS/Goals: 7-10 days modI             Admit to CIR   D/c 6/14- since wife can only pick up Saturday  Con't CIR PT and OT 2.  Antithrombotics: -DVT/anticoagulation:  Pharmaceutical: Eliquis               3. Pain Management: continue Duragesic  patch, Lidoderm  patch, Robaxin  1000 mg every 6 hours as needed, Dilaudid  3 mg every 4 hours as needed   6/5- pt reports pain better controlled, but still taking pain meds frequently- if cannot get improvement in pain in the next few days, will increase Patch and decrease Dilaudid  frequency- very hard to get outpt  Pain ok only took dilaudid  3mg  yesterday and once today thus far   6/9- last dilaudid  yesterday at 1pm- doing so much better- only 2x yesterday  6/10- Last dose of Dilaudid  yesterday afternoon- taking rarely  6/12- pain controlled  6/13- decided doesn't want tramadol - thinks Dilaudid  works well- Dr Bolling Bushy will continue the medicine 4. Mood/Behavior/Sleep: Paxil  10 mg nightly             -antipsychotic agents: N/A   5. Neuropsych/cognition: This patient is capable of making decisions on his own behalf.   6. Skin/Wound Care: Routine skin checks 7. Fluids/Electrolytes/Nutrition: Routine in and outs with follow-up chemistries   8.  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  Followed outpatient by Dr. Maria Shiner   6/11- note left by Oncology- getting labs in AM to follow up on Treatment of lung CA 9.  Chronic hypoxic respiratory failure/COPD/remote tobacco use.  Patient on 3 L oxygen nasal cannula prior to admission.  Maintained on prednisone  therapy   6/5- pt reports only used O2 prn unless in pain/SOB- but on 4L this AM Pt states at home he is usually on 3L but  turns it up a little during activity  6/9- will need to go home at 3-4L and can increase for activity if needed  6/10 d/w SW- will get pt's Home O2 4L since that's high flow  6/11- pt reports has high flow tubing at home and used 3-4 L when needed- is fine  6/13- no changes 10.  Drug-induced neutropenia.  Follow-up hematology services   6/5- WBC 5.3 and Plts 172- will con't  to follow 11.  PAF: continue Amiodarone  200 mg daily, Toprol  25 mg daily.  Cardiac rate controlled.  Follow  up cardiology services   6/5-6/6-  appeared in RRR this AM 12.  GERD.  Continue Protonix    13.  Pneumonitis: continue prednisone    6/5- weaning prednisone  14. Hypokalemia  6/5- will replet K+ for K+ of 3.4- with 40 MEq x1  6/12- K+ 3.7    Latest Ref Rng & Units 07/28/2023    6:19 AM 07/24/2023    7:37 AM 07/21/2023    5:49 AM  BMP  Glucose 70 - 99 mg/dL 161  096  95   BUN 8 - 23 mg/dL 10  12  15    Creatinine 0.61 - 1.24 mg/dL 0.45  4.09  8.11   Sodium 135 - 145 mmol/L 140  136  137   Potassium 3.5 - 5.1 mmol/L 3.7  3.6  3.4   Chloride 98 - 111 mmol/L 105  104  103   CO2 22 - 32 mmol/L 27  27  27    Calcium  8.9 - 10.3 mg/dL 8.7  8.1  8.5   Taking Na+ tabs , pt would like to stop but Na+ low normal  6/8 K+ normal  6/9- Na 136- K+ 3.6 yesterday- will decrease Salt tabs to daily with breakfast and recheck labs Thursday 6/11- labs in AM 6/12- Na 140- will stop Na tabs since Na has rebounded 15. Thrombocytopenia  6/12- Plts down to 144k- oncology already involved   LOS: 9 days A FACE TO FACE EVALUATION WAS PERFORMED  Adell Panek 07/29/2023, 5:14 PM

## 2023-07-29 NOTE — Progress Notes (Signed)
 Occupational Therapy Session Note  Patient Details  Name: John [name] MRN: [id number] Date of Birth: 22-Jun-1960  Today's Date: 07/29/2023 OT Individual Time: [phone] 1401- 1440 ( pt missed 21 mins for nursing care to receive enema) OT Individual Time Calculation (min): 57 min    Short Term Goals: Week 1:  OT Short Term Goal 1 (Week 1): STG=LTG d/t ELOS  Skilled Therapeutic Interventions/Progress Updates:  Session 1: Pt greeted on toilet, pt agreeable to OT intervention but perseverating on needing to have a BM. Pt reporting that's he's panicking. Educated pt on trying not to strain, nurse did come in for further assessment, decided to take miralax  and allow bowels to move on their own time.   Transfers/bed mobility/functional mobility: pt completed stand pivot transfers with rollator MODI.   ADLs:  Transfers: ambulatory toilet transfer with rollator MODI  Education: issued pt handout on energy conservation techniques for home with full education provided.      Exercises: reviewed all HEPs per pt request with pt able to return demo all therex.   Pt requested to complete 10 mins on Nustep for global strengthening and endurance. Pt on 4L during task with SpO2 dropping to as low as 81% but able to rebound with increased time and pursed lip breathing.   Ended session with pt seated in recliner with all needs within reach.               Session 2: Pt greeted seated in recliner, pt agreeable to OT intervention.      Transfers/bed mobility/functional mobility: pt completed functional ambulation to bathroom with rollator MODI.  Pt again concerned about not being able to move his bowels.   ADLs:  Transfers: ambulatory transfer into bathroom with Rollator MODI.  Toileting: pt completed 3/3 toileting tasks MODI   Education: reviewed handout from previous session on econ strategies, emphasized planning, prioritizing and pacing through day.  Educated pt on all w/c parts as w/c  had been dropped off prior to session. Pt able to return demo donning/doffing leg rests.   Exercises:  Provided additional endurance HEP for home as indicated below:  HEP for endurance and strength Complete below exercises 1x daily  30 sec of sit to stands 30 sec rest break 30 sec of standing with reaching arms over head one at a time 30 sec rest break 30 min of standing punches  30 sec rest break  30 sec of standing marches with UB support   However did not practice above therex as pts SpO2 was ranging from 83-86% at rest but demonstrated all therex to pt. Reiterated that pt needs to be monitoring his O2 during these exercises.   Towards end of session pt began to report feeling like he had a fever feeling like he needed to have a BM. Returned pt to room with nurse present to provide enema.                 Ended session with pt handed off directly to nurse.                  Therapy Documentation Precautions:  Precautions Precautions: Back, Fall Precaution Booklet Issued: Yes (comment) Recall of Precautions/Restrictions: Intact Precaution/Restrictions Comments: educated on precautions, able to state 3/3 Restrictions Weight Bearing Restrictions Per Provider Order: No  Pain: No pain    Therapy/Group: Individual Therapy  Mollie Anger [facility] 07/29/2023, 12:15 PM

## 2023-07-30 ENCOUNTER — Other Ambulatory Visit (HOSPITAL_COMMUNITY): Payer: Self-pay

## 2023-07-30 DIAGNOSIS — M5416 Radiculopathy, lumbar region: Secondary | ICD-10-CM | POA: Diagnosis not present

## 2023-07-30 MED ORDER — FENTANYL 25 MCG/HR TD PT72
1.0000 | MEDICATED_PATCH | TRANSDERMAL | Status: DC
  Administered 2023-07-30: 1 via TRANSDERMAL
  Filled 2023-07-30: qty 1

## 2023-07-30 NOTE — Progress Notes (Signed)
 INPATIENT REHABILITATION DISCHARGE NOTE   Discharge instructions by: Demetria Finch, PA  Verbalized understanding: Yes  Skin care/Wound care healing? yes   Pain: medicated prior to DC  IV's: none  Tubes/Drains: none  O2: 4L, [name]  Safety instructions: Reviewed with pt  Patient belongings: sent with pt.   Discharged to: home  Discharged via: family transport  Notes: done   Starlyn Economy, Charity fundraiser

## 2023-07-30 NOTE — Progress Notes (Signed)
 PROGRESS NOTE   Subjective/Complaints:  Pt doing great, ready to go home. Slept great, pain doing better and more manageable, LBM this morning, urinating fine. Denies any other complaints or concerns today. Meds reviewed.   ROS:  Per HPI  Pt denies SOB, abd pain, CP, N/V/C/D, and vision changes    Objective:   No results found. Recent Labs    07/28/23 0619  WBC 5.6  HGB 12.0*  HCT 35.1*  PLT 144*    Recent Labs    07/28/23 0619  NA 140  K 3.7  CL 105  CO2 27  GLUCOSE 104*  BUN 10  CREATININE 0.98  CALCIUM  8.7*     Intake/Output Summary (Last 24 hours) at 07/30/2023 1054 Last data filed at 07/30/2023 0719 Gross per 24 hour  Intake 594 ml  Output 1850 ml  Net -1256 ml        Physical Exam: Vital Signs Blood pressure 103/76, pulse 77, temperature 98.4 F (36.9 C), temperature source Oral, resp. rate 17, height 5' 11 (1.803 m), weight 103.5 kg, SpO2 95%.   General: awake, alert, appropriate, NAD HENT: conjugate gaze; oropharynx moist CV: regular rate; irregular rhythm; no JVD Pulmonary: CTAB with no w/r/r appreciated, on 3L via [place] GI: soft, NT, ND, (+)BS Psychiatric: appropriate- happy, excited Neurological: Ox3    Neurological:     Comments: Patient is alert and oriented x 3.  Follows full commands. Strength is 5/5 except for LLE: 2/5 HF, 4/5 KE , 4/5 DF and PF  Assessment/Plan: 1. Functional deficits which require 3+ hours per day of interdisciplinary therapy in a comprehensive inpatient rehab setting. Physiatrist is providing close team supervision and 24 hour management of active medical problems listed below. Physiatrist and rehab team continue to assess barriers to discharge/monitor patient progress toward functional and medical goals  Care Tool:  Bathing              Bathing assist Assist Level: Independent with assistive device     Upper Body Dressing/Undressing Upper  body dressing   What is the patient wearing?: Pull over shirt    Upper body assist Assist Level: Independent with assistive device    Lower Body Dressing/Undressing Lower body dressing      What is the patient wearing?: Pants, Underwear/pull up     Lower body assist Assist for lower body dressing: Independent with assitive device     Toileting Toileting    Toileting assist Assist for toileting: Independent with assistive device     Transfers Chair/bed transfer  Transfers assist  Chair/bed transfer activity did not occur: Safety/medical concerns  Chair/bed transfer assist level: Independent with assistive device Chair/bed transfer assistive device: Geologist, engineering   Ambulation assist      Assist level: Supervision/Verbal cueing Assistive device: Walker-rolling Max distance: 15'   Walk 10 feet activity   Assist     Assist level: Independent with assistive device Assistive device: Rollator   Walk 50 feet activity   Assist Walk 50 feet with 2 turns activity did not occur: Safety/medical concerns (endurance deficit)  Assist level: Contact Guard/Touching assist Assistive device: Rollator    Walk 150 feet activity  Assist Walk 150 feet activity did not occur: Safety/medical concerns         Walk 10 feet on uneven surface  activity   Assist Walk 10 feet on uneven surfaces activity did not occur: Safety/medical concerns         Wheelchair     Assist Is the patient using a wheelchair?: Yes Type of Wheelchair: Manual Wheelchair activity did not occur: Safety/medical concerns  Wheelchair assist level: Independent Max wheelchair distance: x150    Wheelchair 50 feet with 2 turns activity    Assist    Wheelchair 50 feet with 2 turns activity did not occur: Safety/medical concerns   Assist Level: Independent   Wheelchair 150 feet activity     Assist  Wheelchair 150 feet activity did not occur: Safety/medical  concerns   Assist Level: Independent   Blood pressure 103/76, pulse 77, temperature 98.4 F (36.9 C), temperature source Oral, resp. rate 17, height 5' 11 (1.803 m), weight 103.5 kg, SpO2 95%.  Medical Problem List and Plan: 1. Functional deficits secondary to left L2 lumbar radiculopathy status post L2 compression fracture with kyphoplasty 07/08/2023             -patient may shower             -ELOS/Goals: 7-10 days modI             Admit to CIR   D/c 6/14- since wife can only pick up Saturday  Con't CIR PT and OT  -07/30/23 d/c today, meds reviewed, questions answered 2.  Antithrombotics: -DVT/anticoagulation:  Pharmaceutical: Eliquis               3. Pain Management: continue Duragesic  patch, Lidoderm  patch, Robaxin  1000 mg every 6 hours as needed, Dilaudid  3 mg every 4 hours as needed   6/5- pt reports pain better controlled, but still taking pain meds frequently- if cannot get improvement in pain in the next few days, will increase Patch and decrease Dilaudid  frequency- very hard to get outpt  Pain ok only took dilaudid  3mg  yesterday and once today thus far   6/9- last dilaudid  yesterday at 1pm- doing so much better- only 2x yesterday  6/10- Last dose of Dilaudid  yesterday afternoon- taking rarely  6/12- pain controlled  6/13- decided doesn't want tramadol - thinks Dilaudid  works well- Dr Bolling Bushy will continue the medicine 4. Mood/Behavior/Sleep: Paxil  10 mg nightly             -antipsychotic agents: N/A   5. Neuropsych/cognition: This patient is capable of making decisions on his own behalf.   6. Skin/Wound Care: Routine skin checks 7. Fluids/Electrolytes/Nutrition: Routine in and outs with follow-up chemistries   8.  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  Followed outpatient by Dr. Maria Shiner   6/11- note left by Oncology- getting labs in AM to follow up on Treatment of lung CA 9.  Chronic hypoxic respiratory failure/COPD/remote tobacco use.  Patient  on 3 L oxygen nasal cannula prior to admission.  Maintained on prednisone  therapy   6/5- pt reports only used O2 prn unless in pain/SOB- but on 4L this AM Pt states at home he is usually on 3L but turns it up a little during activity  6/9- will need to go home at 3-4L and can increase for activity if needed  6/10 d/w SW- will get pt's Home O2 4L since that's high flow  6/11- pt reports has high flow tubing at home and used 3-4 L  when needed- is fine  6/13- no changes 10.  Drug-induced neutropenia.  Follow-up hematology services   6/5- WBC 5.3 and Plts 172- will con't to follow 11.  PAF: continue Amiodarone  200 mg daily, Toprol  25 mg daily.  Cardiac rate controlled.  Follow  up cardiology services   6/5-6/6-  appeared in RRR this AM 12.  GERD.  Continue Protonix    13.  Pneumonitis: continue prednisone    6/5- weaning prednisone  14. Hypokalemia  6/5- will replet K+ for K+ of 3.4- with 40 MEq x1  6/12- K+ 3.7    Latest Ref Rng & Units 07/28/2023    6:19 AM 07/24/2023    7:37 AM 07/21/2023    5:49 AM  BMP  Glucose 70 - 99 mg/dL 161  096  95   BUN 8 - 23 mg/dL 10  12  15    Creatinine 0.61 - 1.24 mg/dL 0.45  4.09  8.11   Sodium 135 - 145 mmol/L 140  136  137   Potassium 3.5 - 5.1 mmol/L 3.7  3.6  3.4   Chloride 98 - 111 mmol/L 105  104  103   CO2 22 - 32 mmol/L 27  27  27    Calcium  8.9 - 10.3 mg/dL 8.7  8.1  8.5   Taking Na+ tabs , pt would like to stop but Na+ low normal  6/8 K+ normal  6/9- Na 136- K+ 3.6 yesterday- will decrease Salt tabs to daily with breakfast and recheck labs Thursday 6/11- labs in AM 6/12- Na 140- will stop Na tabs since Na has rebounded 15. Thrombocytopenia  6/12- Plts down to 144k- oncology already involved   LOS: 10 days A FACE TO FACE EVALUATION WAS PERFORMED  [address] [place] Labrina Lines 07/30/2023, 10:54 AM

## 2023-08-01 ENCOUNTER — Telehealth: Payer: Self-pay

## 2023-08-01 NOTE — Telephone Encounter (Signed)
 Incoming call requesting home health orders for PT 2 times a week for 6 weeks, OT 1 week.  Verbal orders given.  Larinda Plover reported that patient had a fall at home while they were there accessing patient.  No injuries to report.  Advised I will document fall and if they notice any physical mental or behavioral changes with patient to call our office and make our Providers aware.Understanding verbalized.

## 2023-08-02 ENCOUNTER — Encounter: Payer: Self-pay | Admitting: General Practice

## 2023-08-02 NOTE — Progress Notes (Signed)
 [facility] Spiritual Care Note  Left voicemail for [name] to introduce Spiritual Care as part of his Patient and Family Support Team, encouraging return call when convenient.   [address] John [name], [place], [facility] Pager [phone] Voicemail [phone]

## 2023-08-05 ENCOUNTER — Inpatient Hospital Stay

## 2023-08-05 ENCOUNTER — Telehealth: Payer: Self-pay

## 2023-08-05 ENCOUNTER — Inpatient Hospital Stay: Admitting: Hematology & Oncology

## 2023-08-05 NOTE — Telephone Encounter (Signed)
 Will from home health requested verbal orders to push back John [name]'s OT evaluation till next week due to him not being available at the times offered.

## 2023-08-08 ENCOUNTER — Encounter: Payer: Self-pay | Admitting: General Practice

## 2023-08-08 NOTE — Progress Notes (Signed)
 [facility] Spiritual Care Note  Left follow-up voicemail to offer Patient and Family Support services, including direct number and encouragement to return all.   [address] Olam Corrigan, [place], [facility] Pager [phone] Voicemail [phone]

## 2023-08-09 ENCOUNTER — Encounter: Payer: Self-pay | Admitting: General Practice

## 2023-08-09 NOTE — Progress Notes (Signed)
 [facility] Spiritual Care Note  Returned Mr Jansma return call, reaching him directly. He was very appreciative to learn of support resources, especially the Lung Cancer Support Group. We have gotten him registered to receive next month's email with the link for the virtual session (noon on the third Thursday, so July 17).  He is aware of ongoing Spiritual Care availability and plans to phone back as needed/desired when time allows.   [address] Olam Corrigan, [place], [facility] Pager [phone] Voicemail [phone]

## 2023-08-10 ENCOUNTER — Inpatient Hospital Stay: Admitting: Nurse Practitioner

## 2023-08-15 ENCOUNTER — Telehealth: Payer: Self-pay

## 2023-08-15 ENCOUNTER — Telehealth: Payer: Self-pay | Admitting: Cardiology

## 2023-08-15 NOTE — Telephone Encounter (Signed)
 Attempted to contact patient, left message to call office back

## 2023-08-15 NOTE — Telephone Encounter (Addendum)
 The in the home physical therapist  Oneil Countryman (ph [phone]) called:   John [name] heart rate is 103-104, blood pressure 108/70. He did stop the Metoprolol  because of low BP reading. He has now re-started the medication.    Dr. Cornelio is out of the office. Patient does not have a follow up with Dr. Lovorn. Physical Therapy has been advised to report the finding to the PCP and Cardiologist  John Holts  MD [phone].

## 2023-08-15 NOTE — Telephone Encounter (Signed)
 Spoke with patient, educated on metoprolol  being used for heart rate and goal rates for AF would be less than 110. Patient denies any further symptoms from AF, chest discomfort/SOB/fatigue. No needs at this time

## 2023-08-15 NOTE — Telephone Encounter (Signed)
 Mark with [facility] saw pt this morning and his hr was 103 He also advised that he has not taken his metoprolol  all weekend. Requesting cb to him and pt to discuss

## 2023-08-16 ENCOUNTER — Telehealth: Payer: Self-pay

## 2023-08-16 DIAGNOSIS — J9611 Chronic respiratory failure with hypoxia: Secondary | ICD-10-CM

## 2023-08-16 NOTE — Addendum Note (Signed)
 Addended by: Shlome Baldree T on: 08/16/2023 03:48 PM   Modules accepted: Orders

## 2023-08-16 NOTE — Telephone Encounter (Signed)
 nfn

## 2023-08-16 NOTE — Addendum Note (Signed)
 Addended by: Hasel Janish T on: 08/16/2023 03:50 PM   Modules accepted: Orders

## 2023-08-17 ENCOUNTER — Other Ambulatory Visit: Payer: Self-pay

## 2023-08-17 NOTE — Addendum Note (Signed)
 Addended by: ROLANDA POWELL SAILOR on: 08/17/2023 10:20 AM   Modules accepted: Orders

## 2023-08-17 NOTE — Addendum Note (Signed)
 Addended by: ORLIE MADELIN RAMAN on: 08/17/2023 10:35 AM   Modules accepted: Orders

## 2023-08-21 ENCOUNTER — Other Ambulatory Visit: Payer: Self-pay

## 2023-08-24 ENCOUNTER — Telehealth: Payer: Self-pay | Admitting: Nurse Practitioner

## 2023-08-24 NOTE — Telephone Encounter (Signed)
Scheduled appointments with the patient

## 2023-08-25 ENCOUNTER — Telehealth: Payer: Self-pay

## 2023-08-25 NOTE — Telephone Encounter (Signed)
 Copied from CRM [id number]. Topic: Clinical - Prescription Issue >> Aug 22, 2023  4:08 PM Celestine FALCON wrote: Reason for CRM: John [name] with [employer] phone number [phone] Is calling due to the pt's insurance denying use of [employer], but the patient is still wanting to buy a unit. John [name] stated he has faxed over the order request and needs NP Tammy Parrett's signature and date and fax it back to [phone].     Returned call  to [employer] - LM that as soon as Mrs.Parrent is back in clinic she will sign paperwork.

## 2023-08-26 ENCOUNTER — Other Ambulatory Visit: Payer: Self-pay

## 2023-08-29 ENCOUNTER — Telehealth: Payer: Self-pay | Admitting: Adult Health

## 2023-08-29 ENCOUNTER — Ambulatory Visit (INDEPENDENT_AMBULATORY_CARE_PROVIDER_SITE_OTHER): Payer: Self-pay | Admitting: Student

## 2023-08-29 ENCOUNTER — Telehealth: Payer: Self-pay | Admitting: *Deleted

## 2023-08-29 VITALS — BP 119/81 | HR 66 | Temp 98.2°F | Ht 71.0 in | Wt 232.8 lb

## 2023-08-29 DIAGNOSIS — D6869 Other thrombophilia: Secondary | ICD-10-CM

## 2023-08-29 DIAGNOSIS — Z9889 Other specified postprocedural states: Secondary | ICD-10-CM | POA: Diagnosis not present

## 2023-08-29 DIAGNOSIS — I4819 Other persistent atrial fibrillation: Secondary | ICD-10-CM | POA: Diagnosis not present

## 2023-08-29 DIAGNOSIS — J9621 Acute and chronic respiratory failure with hypoxia: Secondary | ICD-10-CM

## 2023-08-29 DIAGNOSIS — L600 Ingrowing nail: Secondary | ICD-10-CM | POA: Diagnosis not present

## 2023-08-29 MED ORDER — PAROXETINE HCL 10 MG PO TABS: 10.0000 mg | ORAL_TABLET | Freq: Every day | ORAL | 3 refills | Status: AC

## 2023-08-29 NOTE — Assessment & Plan Note (Signed)
 No obvious signs of bleeding.  Continue Eliquis  5 mg BID

## 2023-08-29 NOTE — Telephone Encounter (Signed)
 Please see last signed encounter. O2 company calling again saying they can not wait for John [name] to return on the 21st to sign this order. PT has been calling them everyday for it. Rep says it is a cash order and any Dr. can sign it.Please be advised. TY/.

## 2023-08-29 NOTE — Patient Instructions (Addendum)
 laThank you, [name], for trusting us  with your care today. During our visit, we reviewed your recent hospitalization.  I'm pleased to hear that you are doing well and have no acute concerns. As we discussed, due to your compression fracture, I will be arranging a DEXA scan for you.     I have ordered the following labs for you:  Lab Orders  No laboratory test(s) ordered today     Tests ordered today:    Referrals ordered today:   Referral Orders         Ambulatory referral to Podiatry       I have ordered the following medication/changed the following medications:   Stop the following medications: Medications Discontinued During This Encounter  Medication Reason   benzonatate  (TESSALON ) 200 MG capsule    ondansetron  (ZOFRAN ) injection 8 mg    pantoprazole  (PROTONIX ) 40 MG tablet    PARoxetine  (PAXIL ) 10 MG tablet Reorder     Start the following medications: Meds ordered this encounter  Medications   PARoxetine  (PAXIL ) 10 MG tablet    Sig: Take 1 tablet (10 mg total) by mouth at bedtime.    Dispense:  90 tablet    Refill:  3     Follow up: 3 months   Remember:   Should you have any questions or concerns please call the internal medicine clinic at [phone].   [name] MD 08/29/2023, 2:37 PM   [facility]

## 2023-08-29 NOTE — Progress Notes (Signed)
 CC: Routine follow up   HPI:  [name] is a 63 y.o. male living with a history stated below and presents today for routine follow up . Please see problem based assessment and plan for additional details.  Past Medical History:  Diagnosis Date   Arthritis    Rheumatoid   Bacteremia due to Pseudomonas    C. difficile colitis    Calculus of GB and bile duct w ac and chr cholecyst w obst 06/08/2023   Chronic anxiety 11/08/2014   Cough 09/22/2021   COVID 2020   mild case   Depression    Drug-induced neutropenia (HCC) 08/11/2020   Family history of adverse reaction to anesthesia    brother with seizures had episode under anesthesia.  Patient has seizures as well.   GERD (gastroesophageal reflux disease)    History of radiation therapy 04/24/2020-05/16/2020   IMRT to right lung     Dr Lynwood Nasuti   History of radiation therapy    08/10/21-08/19/21-Dr. Lynwood Nasuti   History of radiation therapy    Abdomen- 03/21/23-03/31/23-Dr. Lynwood Nasuti   Neutropenia [facility]) 05/12/2020   Neutropenic fever (HCC) 05/12/2020   Normocytic anemia 04/18/2021   PAF (paroxysmal atrial fibrillation) (HCC)    CHADS2VSAC score 3   Rheumatoid aortitis    Secondary hypercoagulable state (HCC) 10/28/2020   Sepsis (HCC) 10/06/2020   Shortness of breath 12/18/2020   Squamous cell lung cancer (HCC)     Current Outpatient Medications on File Prior to Visit  Medication Sig Dispense Refill   acetaminophen  (TYLENOL ) 325 MG tablet Take 2 tablets (650 mg total) by mouth every 6 (six) hours as needed for mild pain (pain score 1-3), fever or headache.     amiodarone  (PACERONE ) 200 MG tablet Take 1 tablet (200 mg total) by mouth daily. 30 tablet 0   apixaban  (ELIQUIS ) 5 MG TABS tablet Take 1 tablet (5 mg total) by mouth 2 (two) times daily. 60 tablet 0   Evolocumab  (REPATHA  SURECLICK) 140 MG/ML SOAJ ADMINISTER 1 ML UNDER THE SKIN EVERY 14 DAYS 6 mL 3   fentaNYL  (DURAGESIC ) 25 MCG/HR Place 1 patch onto the  skin every 3 (three) days. 5 patch 0   lidocaine  (LIDODERM ) 5 % Place 1 patch onto the skin daily. Remove & Discard patch within 12 hours or as directed by MD 30 patch 0   methocarbamol  (ROBAXIN ) 500 MG tablet Take 2 tablets (1,000 mg total) by mouth every 6 (six) hours as needed for muscle spasms. 120 tablet 0   metoprolol  succinate (TOPROL -XL) 25 MG 24 hr tablet Take 0.5 tablets (12.5 mg total) by mouth daily. 30 tablet 0   polyethylene glycol (MIRALAX  / GLYCOLAX ) 17 g packet Take 17 g by mouth daily as needed for mild constipation.     predniSONE  (DELTASONE ) 5 MG tablet Take 1 tablet (5 mg total) by mouth daily with breakfast. 30 tablet 0   traMADol  (ULTRAM ) 50 MG tablet Take 2 tablets (100 mg total) by mouth every 6 (six) hours as needed for moderate pain (pain score 4-6). 30 tablet 0   TRELEGY ELLIPTA  100-62.5-25 MCG/ACT AEPB INHALE 1 PUFF INTO THE LUNGS DAILY 60 each 5   Vitamin D , Ergocalciferol , (DRISDOL ) 1.25 MG (50000 UNIT) CAPS capsule Take 1 capsule (50,000 Units total) by mouth every 7 (seven) days. 5 capsule 0   No current facility-administered medications on file prior to visit.    Family History  Problem Relation Age of Onset   Arrhythmia Mother  has PPM   Heart disease Father        Died at 71, started in his 62s, heart attacks, had PPM and ICD    Social History   Socioeconomic History   Marital status: Married    Spouse name: Not on file   Number of children: Not on file   Years of education: Not on file   Highest education level: Not on file  Occupational History   Not on file  Tobacco Use   Smoking status: Former    Current packs/day: 0.00    Average packs/day: 1 pack/day for 30.0 years (30.0 ttl pk-yrs)    Types: Cigarettes    Start date: 55    Quit date: 2018    Years since quitting: 7.5    Passive exposure: Past   Smokeless tobacco: Never   Tobacco comments:    Former smoker, quit 2018  Vaping Use   Vaping status: Never Used  Substance and  Sexual Activity   Alcohol use: Not Currently   Drug use: Never   Sexual activity: Not Currently  Other Topics Concern   Not on file  Social History Narrative   Not on file   Social Drivers of Health   Financial Resource Strain: Not on file  Food Insecurity: No Food Insecurity (07/02/2023)   Hunger Vital Sign    Worried About Running Out of Food in the Last Year: Never true    Ran Out of Food in the Last Year: Never true  Transportation Needs: No Transportation Needs (07/02/2023)   PRAPARE - Administrator, Civil Service (Medical): No    Lack of Transportation (Non-Medical): No  Physical Activity: Not on file  Stress: Not on file  Social Connections: Moderately Integrated (03/16/2022)   Social Connection and Isolation Panel    Frequency of Communication with Friends and Family: More than three times a week    Frequency of Social Gatherings with Friends and Family: More than three times a week    Attends Religious Services: More than 4 times per year    Active Member of [employer] or Organizations: No    Attends Banker Meetings: Never    Marital Status: Married  Catering manager Violence: Not At Risk (07/02/2023)   Humiliation, Afraid, Rape, and Kick questionnaire    Fear of Current or Ex-Partner: No    Emotionally Abused: No    Physically Abused: No    Sexually Abused: No    Review of Systems: ROS negative except for what is noted on the assessment and plan.  Vitals:   08/29/23 1344 08/29/23 1346 08/29/23 1423  BP: 95/66 98/63 119/81  Pulse: 73 76 66  Temp: 98.2 F (36.8 C)    TempSrc: Oral    SpO2: 91% 91%   Weight: 232 lb 12.8 oz (105.6 kg)    Height: 5' 11 (1.803 m)      Physical Exam: Constitutional: well-appearing man, in a wheel chair  Cardiovascular: regular rate and rhythm, no m/r/g Pulmonary/Chest: On 2L [place],(on home 3L [place] chronically ) , no accessory muscle use Abdominal: soft, non-tender, non-distended MSK: normal bulk and tone, no  lower extremity edema, surgical site looks clean dry and intact with appropriate healing scar.In grown nail present on left great toe  Skin: warm and dry Psych: normal mood and behavior  Assessment & Plan:   Hypercoagulable state due to persistent atrial fibrillation (HCC) No obvious signs of bleeding.  Continue Eliquis  5 mg BID  S/P  kyphoplasty - L2 on 07-08-2023 Mr. Rill is a 63 year old male with a history of squamous cell carcinoma and atrial fibrillation managed with Eliquis , who presented for a follow-up after recent hospitalization. He was admitted in May for lower back pain and diagnosed with an acute compression fracture of L2, for which he underwent kyphoplasty.  Today, he reports significant improvement in his lower back pain while on a multimodal pain management regimen. He feels notably better than he did prior to the surgery, which is reassuring. He has no other major complaints except for episodes of knee buckling, which concern him due to the risk of falling. He is currently receiving physical therapy and has requested a knee brace for added knee stabilization. I advised him to continue working with his physical therapist to determine the most appropriate knee brace. Otherwise, he is doing well, and today's physical examination is unremarkable. There is no swelling or tenderness around the lower back, and the surgical site appears clean, dry, and intact with a well-healed scar. However, I am concerned that the compression fracture occurred without any trauma, which is suggestive of underlying osteoporosis. He has never had a DEXA scan, and given his chronic steroid use, I believe it is appropriate to arrange for one now to evaluate his bone density. - Continue multimodal pain regimen - DEXA scan - Discussed potential discontinuation of prednisone  during next visit as it is unclear if he is supposed to be on it chronically.  Ingrown nail of great toe The patient came to the office  today to discuss his lower back pain as well as his other chronic conditions. He expressed concern about an ingrown toenail. Although he does not report any pain, he is worried about its appearance, which has prevented him from wearing open-toe shoes. We discussed a referral to podiatry, and the patient agreed. -Ambulatory referral to podiatry    Patient discussed with Dr. Karna Drue Grow, M.D [facility] Phone: [phone] Date 08/29/2023 Time 2:55 PM

## 2023-08-29 NOTE — Assessment & Plan Note (Signed)
 The patient came to the office today to discuss his lower back pain as well as his other chronic conditions. He expressed concern about an ingrown toenail. Although he does not report any pain, he is worried about its appearance, which has prevented him from wearing open-toe shoes. We discussed a referral to podiatry, and the patient agreed. -Ambulatory referral to podiatry

## 2023-08-29 NOTE — Assessment & Plan Note (Addendum)
 John [name] is a 63 year old male with a history of squamous cell carcinoma and atrial fibrillation managed with Eliquis , who presented for a follow-up after recent hospitalization. He was admitted in May for lower back pain and diagnosed with an acute compression fracture of L2, for which he underwent kyphoplasty.  Today, he reports significant improvement in his lower back pain while on a multimodal pain management regimen. He feels notably better than he did prior to the surgery, which is reassuring. He has no other major complaints except for episodes of knee buckling, which concern him due to the risk of falling. He is currently receiving physical therapy and has requested a knee brace for added knee stabilization. I advised him to continue working with his physical therapist to determine the most appropriate knee brace. Otherwise, he is doing well, and today's physical examination is unremarkable. There is no swelling or tenderness around the lower back, and the surgical site appears clean, dry, and intact with a well-healed scar. However, I am concerned that the compression fracture occurred without any trauma, which is suggestive of underlying osteoporosis. He has never had a DEXA scan, and given his chronic steroid use, I believe it is appropriate to arrange for one now to evaluate his bone density. - Continue multimodal pain regimen - DEXA scan - Discussed potential discontinuation of prednisone  during next visit as it is unclear if he is supposed to be on it chronically.

## 2023-08-29 NOTE — Telephone Encounter (Signed)
 CMN received for Stationary concentrator from [employer] from [employer].  Placed in [facility] folder for Tammy to sign when she returns to the office on 09/05/23.

## 2023-08-30 NOTE — Telephone Encounter (Signed)
 Called and spoke with patient,he states his current concentrator makes a lot of noise and when he travels it is very hard to move.  I advised him that Tammy would be back on Monday and I would send her a message letting her know that we need her signature to get him the [employer] home concentrator. I let him know that unless it is an emergency/urgent, we do not have other providers sign orders for other providers because the information for his provider is on the form and it has to be changed and then the paperwork would then come to the other provider.  He verbalized understanding and is willing to wait for Tammy to return to handle the order.  Nothing further needed.  Tammy, Please sign [employer] order for this patient when you return, it is in the yellow folder.   Thank you.

## 2023-08-31 NOTE — Addendum Note (Signed)
 Addended by: KARNA FELLOWS on: 08/31/2023 09:24 AM   Modules accepted: Level of Service

## 2023-08-31 NOTE — Progress Notes (Signed)
 [facility] Attending  Case discussed with the resident at the time of the visit.  We reviewed the resident's history and exam and pertinent patient test results.  I agree with the assessment, diagnosis, and plan of care documented in the resident's note.  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

## 2023-09-05 ENCOUNTER — Other Ambulatory Visit: Payer: Self-pay | Admitting: *Deleted

## 2023-09-05 ENCOUNTER — Telehealth: Payer: Self-pay

## 2023-09-05 DIAGNOSIS — C3400 Malignant neoplasm of unspecified main bronchus: Secondary | ICD-10-CM

## 2023-09-05 DIAGNOSIS — C3491 Malignant neoplasm of unspecified part of right bronchus or lung: Secondary | ICD-10-CM

## 2023-09-05 NOTE — Telephone Encounter (Signed)
 Signed.

## 2023-09-05 NOTE — Telephone Encounter (Signed)
 Faxed back to [employer]

## 2023-09-05 NOTE — Telephone Encounter (Signed)
Faxed [employer] order

## 2023-09-06 ENCOUNTER — Encounter: Payer: Self-pay | Admitting: Hematology & Oncology

## 2023-09-06 ENCOUNTER — Inpatient Hospital Stay: Attending: Internal Medicine

## 2023-09-06 ENCOUNTER — Inpatient Hospital Stay

## 2023-09-06 ENCOUNTER — Inpatient Hospital Stay (HOSPITAL_BASED_OUTPATIENT_CLINIC_OR_DEPARTMENT_OTHER): Admitting: Hematology & Oncology

## 2023-09-06 ENCOUNTER — Other Ambulatory Visit: Payer: Self-pay

## 2023-09-06 VITALS — BP 95/77 | HR 89 | Temp 98.1°F | Resp 18 | Ht 71.0 in | Wt 232.0 lb

## 2023-09-06 DIAGNOSIS — C3491 Malignant neoplasm of unspecified part of right bronchus or lung: Secondary | ICD-10-CM | POA: Diagnosis not present

## 2023-09-06 DIAGNOSIS — C3411 Malignant neoplasm of upper lobe, right bronchus or lung: Secondary | ICD-10-CM | POA: Insufficient documentation

## 2023-09-06 DIAGNOSIS — Z9221 Personal history of antineoplastic chemotherapy: Secondary | ICD-10-CM | POA: Diagnosis not present

## 2023-09-06 DIAGNOSIS — C779 Secondary and unspecified malignant neoplasm of lymph node, unspecified: Secondary | ICD-10-CM | POA: Diagnosis not present

## 2023-09-06 DIAGNOSIS — C7970 Secondary malignant neoplasm of unspecified adrenal gland: Secondary | ICD-10-CM | POA: Insufficient documentation

## 2023-09-06 DIAGNOSIS — C3400 Malignant neoplasm of unspecified main bronchus: Secondary | ICD-10-CM

## 2023-09-06 LAB — CMP (CANCER CENTER ONLY)
ALT: 13 U/L (ref 0–44)
AST: 21 U/L (ref 15–41)
Albumin: 3.8 g/dL (ref 3.5–5.0)
Alkaline Phosphatase: 66 U/L (ref 38–126)
Anion gap: 10 (ref 5–15)
BUN: 8 mg/dL (ref 8–23)
CO2: 24 mmol/L (ref 22–32)
Calcium: 8.7 mg/dL — ABNORMAL LOW (ref 8.9–10.3)
Chloride: 107 mmol/L (ref 98–111)
Creatinine: 1.02 mg/dL (ref 0.61–1.24)
GFR, Estimated: >60 mL/min (ref >60)
Glucose, Bld: 92 mg/dL (ref 70–99)
Potassium: 3.9 mmol/L (ref 3.5–5.1)
Sodium: 141 mmol/L (ref 135–145)
Total Bilirubin: 0.7 mg/dL (ref 0.0–1.2)
Total Protein: 6.3 g/dL — ABNORMAL LOW (ref 6.5–8.1)

## 2023-09-06 LAB — CBC WITH DIFFERENTIAL (CANCER CENTER ONLY)
Abs Immature Granulocytes: 0.01 K/uL (ref 0.00–0.07)
Basophils Absolute: 0 K/uL (ref 0.0–0.1)
Basophils Relative: 1 %
Eosinophils Absolute: 0.2 K/uL (ref 0.0–0.5)
Eosinophils Relative: 5 %
HCT: 38.5 % — ABNORMAL LOW (ref 39.0–52.0)
Hemoglobin: 12.9 g/dL — ABNORMAL LOW (ref 13.0–17.0)
Immature Granulocytes: 0 %
Lymphocytes Relative: 20 %
Lymphs Abs: 0.8 K/uL (ref 0.7–4.0)
MCH: 31.6 pg (ref 26.0–34.0)
MCHC: 33.5 g/dL (ref 30.0–36.0)
MCV: 94.4 fL (ref 80.0–100.0)
Monocytes Absolute: 0.5 K/uL (ref 0.1–1.0)
Monocytes Relative: 13 %
Neutro Abs: 2.4 K/uL (ref 1.7–7.7)
Neutrophils Relative %: 61 %
Platelet Count: 165 K/uL (ref 150–400)
RBC: 4.08 MIL/uL — ABNORMAL LOW (ref 4.22–5.81)
RDW: 14.2 % (ref 11.5–15.5)
WBC Count: 3.9 K/uL — ABNORMAL LOW (ref 4.0–10.5)
nRBC: 0 % (ref 0.0–0.2)

## 2023-09-06 LAB — LACTATE DEHYDROGENASE: LDH: 239 U/L — ABNORMAL HIGH (ref 98–192)

## 2023-09-06 NOTE — Progress Notes (Signed)
 John [name] + Hematology and Oncology Follow Up Visit  John [name] [id number] 1960/03/28 63 y.o. 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 3 months --next dose in 06/2023  XRT -right adrenal gland.  2 finish on 04/04/2023 Gemzar /Navelbine  -s/p cycle 1  - start on 05/26/2023    Interim History:  John [name] is here today for follow-up.  It has been a while since he been in the office.  He was hospitalized back in May because of severe lower back pain.  He had a compression fracture secondary to being on steroids.  It was not pathologic.  He finally was able to get insurance to approve for a kyphoplasty.  This was done on 07/08/2023.  He had some residual left leg weakness.  I am sure there is probably some nerve irritation.John  He then went to [facility].  He did well over there.  He is now is at home.  He is gettin physical therapy at home.  He has not had treatment for his lung cancer now for about 2 months.  His appetite is good.  I do not think is lost any weight.  He has had no increased cough or shortness of breath.  He has had no problems with increased pain.  He has had no change in bowel or bladder habits.  He is still recovering from the gallbladder removal that he had.  I think this was back in April.John  He has had no rashes.  He has had no bleeding.  Overall, I would say his performance status is probably ECOG 2.      Medications:  Allergies as of 09/06/2023       Reactions   Ventolin  [albuterol ] Other (See Comments)   Patient has had episode of atrial fib following administration of albuterol  (tolerates Xopenex  well)   Lipitor [atorvastatin ] Other (See Comments)    Arthralgias    Oxycodone  Other (See Comments)   Severe Migranes   Ketamine  Anxiety   Feeling of doom        Medication List        Accurate as of September 06, 2023  8:25 AM. If you have any questions, ask your nurse or doctor.          acetaminophen  325 MG tablet Commonly known as: TYLENOL  Take 2 tablets (650 mg total) by mouth every 6 (six) hours as needed for mild pain (pain score 1-3), fever or headache.   amiodarone  200 MG tablet Commonly known as: PACERONE  Take 1 tablet (200 mg total) by mouth daily.   apixaban  5 MG Tabs tablet Commonly known as: ELIQUIS  Take 1 tablet (5 mg total) by mouth 2 (two) times daily.   fentaNYL  25 MCG/HR Commonly known as: DURAGESIC  Place 1 patch onto the skin every 3 (three) days.   lidocaine  5 % Commonly known as: LIDODERM  Place 1 patch onto the skin daily. Remove & Discard patch within 12 hours or as directed by MD   methocarbamol  500 MG tablet Commonly known as: ROBAXIN  Take 2 tablets (1,000 mg total) by mouth every 6 (six) hours as needed for muscle spasms.  metoprolol  succinate 25 MG 24 hr tablet Commonly known as: TOPROL -XL Take 0.5 tablets (12.5 mg total) by mouth daily.   PARoxetine  10 MG tablet Commonly known as: PAXIL  Take 1 tablet (10 mg total) by mouth at bedtime.   polyethylene glycol 17 g packet Commonly known as: MIRALAX  / GLYCOLAX  Take 17 g by mouth daily as needed for mild constipation.   predniSONE  5 MG tablet Commonly known as: DELTASONE  Take 1 tablet (5 mg total) by mouth daily with breakfast.   Repatha  SureClick 140 MG/ML Soaj Generic drug: Evolocumab  ADMINISTER 1 ML UNDER THE SKIN EVERY 14 DAYS   traMADol  50 MG tablet Commonly known as: ULTRAM  Take 2 tablets (100 mg total) by mouth every 6 (six) hours as needed for moderate pain (pain score 4-6).   Trelegy Ellipta  100-62.5-25 MCG/ACT Aepb Generic drug: Fluticasone -Umeclidin-[facility] INHALE 1 PUFF INTO THE LUNGS DAILY   Vitamin D  (Ergocalciferol )  1.25 MG (50000 UNIT) Caps capsule Commonly known as: DRISDOL  Take 1 capsule (50,000 Units total) by mouth every 7 (seven) days.        Allergies:  Allergies  Allergen Reactions   Ventolin  [Albuterol ] Other (See Comments)    Patient has had episode of atrial fib following administration of albuterol  (tolerates Xopenex  well)   Lipitor [Atorvastatin ] Other (See Comments)    Arthralgias    Oxycodone  Other (See Comments)    Severe Migranes   Ketamine  Anxiety    Feeling of doom    Past Medical History, Surgical history, Social history, and Family History were reviewed and updated.  Review of Systems: Review of Systems  Constitutional:  Positive for malaise/fatigue.  HENT: Negative.    Eyes: Negative.   Respiratory:  Positive for cough and shortness of breath.   Cardiovascular:  Positive for chest pain.  Gastrointestinal: Negative.   Genitourinary: Negative.   Musculoskeletal:  Positive for joint pain.  Skin: Negative.   Neurological:  Positive for focal weakness.  Endo/Heme/Allergies: Negative.   Psychiatric/Behavioral: Negative.       Physical Exam:  Temperature is 98.1.  Pulse 89.  Blood pressure 95/77.  Weight is 232 pounds.    Wt Readings from Last 3 Encounters:  08/29/23 232 lb 12.8 oz (105.6 kg)  07/20/23 228 lb 2.8 oz (103.5 kg)  07/03/23 234 lb (106.1 kg)    Physical Exam Vitals reviewed.  HENT:     Head: Normocephalic and atraumatic.  Eyes:     Pupils: Pupils are equal, round, and reactive to light.  Cardiovascular:     Rate and Rhythm: Normal rate and regular rhythm.     Heart sounds: Normal heart sounds.  Pulmonary:     Effort: Pulmonary effort is normal.     Breath sounds: Normal breath sounds.  Abdominal:     General: Bowel sounds are normal.     Palpations: Abdomen is soft.  Musculoskeletal:        General: No tenderness or deformity. Normal range of motion.     Cervical back: Normal range of motion.  Lymphadenopathy:     Cervical: No  cervical adenopathy.  Skin:    General: Skin is warm and dry.     Findings: No erythema or rash.  Neurological:     Mental Status: He is alert and oriented to person, place, and time.  Psychiatric:        Behavior: Behavior normal.        Thought Content: Thought content normal.        Judgment: Judgment normal.  Lab Results  Component Value Date   WBC 3.9 (L) 09/06/2023   HGB 12.9 (L) 09/06/2023   HCT 38.5 (L) 09/06/2023   MCV 94.4 09/06/2023   PLT 165 09/06/2023   Lab Results  Component Value Date   FERRITIN 446 (H) 08/23/2021   IRON 189 (H) 08/26/2021   TIBC 213 (L) 08/26/2021   UIBC 24 08/26/2021   IRONPCTSAT 89 (H) 08/26/2021   Lab Results  Component Value Date   RETICCTPCT 1.9 08/23/2021   RBC 4.08 (L) 09/06/2023   No results found for: KPAFRELGTCHN, LAMBDASER, KAPLAMBRATIO No results found for: IGGSERUM, IGA, IGMSERUM No results found for: STEPHANY CARLOTA BENSON MARKEL EARLA JOANNIE DOC VICK, SPEI   Chemistry      Component Value Date/Time   NA 140 07/28/2023 0619   NA 139 04/12/2023 0937   K 3.7 07/28/2023 0619   CL 105 07/28/2023 0619   CO2 27 07/28/2023 0619   BUN 10 07/28/2023 0619   BUN 12 04/12/2023 0937   CREATININE 0.98 07/28/2023 0619   CREATININE 1.04 07/01/2023 0814      Component Value Date/Time   CALCIUM  8.7 (L) 07/28/2023 0619   ALKPHOS 55 07/28/2023 0619   AST 19 07/28/2023 0619   AST 42 (H) 07/01/2023 0814   ALT 23 07/28/2023 0619   ALT 55 (H) 07/01/2023 0814   BILITOT 0.7 07/28/2023 0619   BILITOT 1.1 07/01/2023 0814       Impression and Plan: John [name] is a very pleasant 63 yo gentleman with metastatic squamous cell carcinoma of the right lung.  The tumor had high PD-L1 score.  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  I will set him up with a PET scan.  I think his last PET scan was back in December.  He will be interesting to see how  everything looks.  Again, he has not had treatment now for couple months.  He has had a problem with the lower back.  I am glad this has been taken care of and hopefully he will continue to make progress with respect to the left leg and mobility.  As always, his wife is such a huge inspiration for him.  She really is doing a good job.  We will get the PET scan in a couple weeks or so.  I will then plan to get him back and we will see what the options are with respect to treatment.  We will plan to get him back to see us  in another month.    Maude JONELLE Crease, MD 7/22/20258:25 AM

## 2023-09-07 ENCOUNTER — Other Ambulatory Visit: Payer: Self-pay

## 2023-09-09 ENCOUNTER — Encounter: Payer: Self-pay | Admitting: Podiatry

## 2023-09-09 ENCOUNTER — Ambulatory Visit (INDEPENDENT_AMBULATORY_CARE_PROVIDER_SITE_OTHER): Admitting: Podiatry

## 2023-09-09 DIAGNOSIS — M79675 Pain in left toe(s): Secondary | ICD-10-CM

## 2023-09-09 DIAGNOSIS — G620 Drug-induced polyneuropathy: Secondary | ICD-10-CM

## 2023-09-09 DIAGNOSIS — B351 Tinea unguium: Secondary | ICD-10-CM

## 2023-09-09 DIAGNOSIS — B353 Tinea pedis: Secondary | ICD-10-CM

## 2023-09-09 DIAGNOSIS — M79674 Pain in right toe(s): Secondary | ICD-10-CM | POA: Diagnosis not present

## 2023-09-09 MED ORDER — KETOCONAZOLE 2 % EX CREA: 1.0000 | TOPICAL_CREAM | Freq: Every day | CUTANEOUS | 1 refills | Status: AC

## 2023-09-09 NOTE — Patient Instructions (Signed)
 You can combine 3 tablespoons of coconut oil and 10-15 drops of tea tree oil together and apply to the toenails once a day.

## 2023-09-09 NOTE — Telephone Encounter (Unsigned)
 Copied from CRM [id number]. Topic: Clinical - Order For Equipment >> Sep 09, 2023 12:19 PM Celestine FALCON wrote: Reason for CRM: Curtistine from [employer] is calling to have NP Tammy Parrett sign AND date the order. He stated the order was received today, but it didn't have the signature and the date (date was missing) on the order. Please send back to fax number [phone].

## 2023-09-09 NOTE — Progress Notes (Signed)
  Subjective:  Patient ID: John [name], male    DOB: Jul 03, 1960,  MRN: [id number]  Chief Complaint  Patient presents with   Nail Problem    Left first nail, is a fungal nail, not exactly an ingrown, but he is ok with taking the nail off if needed. He has had a lot of Chemo in the past and all the nails on the left foot are thick and discolored. Not diabetic and takes Eliquis .     63 y.o. male presents with the above complaint. History confirmed with patient. Patient presenting with pain related to dystrophic thickened elongated nails. Patient is unable to trim own nails related to nail dystrophy. Patient does not have a history of T2DM.  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  Objective:  Physical Exam: warm, good capillary refill, decreased pedal hair growth, pedal skin atrophic nail exam onychomycosis of the toenails, onycholysis, and dystrophic nails, left foot toenails more affected than right. DP pulses palpable, PT pulses palpable, and protective sensation absent Left Foot:  Pain with palpation of nails due to elongation and dystrophic growth.  Left first toenail especially dystrophic and painful Right Foot: Pain with palpation of nails due to elongation and dystrophic growth.  Bilateral plantar feet flaky, dry, xerotic erythematous rash present.  Assessment:   1. Tinea pedis of both feet   2. Pain due to onychomycosis of toenails of both feet   3. Peripheral neuropathy due to chemotherapy [facility])      Plan:  Patient was evaluated and treated and all questions answered.  # Tinea pedis of both feet - Flaky dry skin rash both feet - Did discuss neuropathy as a contributing factor - Did discuss pedal hygiene in the treatment and maintenance of tinea pedis - Prescription for 2% ketoconazole  cream sent to patient's pharmacy to be applied daily over the next 3 weeks.  Transition to over-the-counter moisturizing creams and lotions following  this. - Follow-up if this does not improve  #Onychomycosis with pain  -Nails palliatively debrided as below. -Educated on self-care - On Eliquis   Procedure: Nail Debridement Rationale: Pain Type of Debridement: manual, sharp debridement. Instrumentation: Nail nipper, rotary burr. Number of Nails: 10  # Chemotherapy-induced neuropathy - Findings discussed with patient - Did encourage patient to discuss treatment options  Return in about 3 months (around 12/10/2023) for Routine Foot Care.         John [name], DPM [facility] / [facility]

## 2023-09-10 ENCOUNTER — Other Ambulatory Visit: Payer: Self-pay

## 2023-09-12 ENCOUNTER — Encounter: Payer: Self-pay | Admitting: Nurse Practitioner

## 2023-09-12 ENCOUNTER — Inpatient Hospital Stay (HOSPITAL_BASED_OUTPATIENT_CLINIC_OR_DEPARTMENT_OTHER): Admitting: Nurse Practitioner

## 2023-09-12 VITALS — BP 107/75 | HR 78 | Temp 98.2°F | Resp 18 | Ht 71.0 in | Wt 235.8 lb

## 2023-09-12 DIAGNOSIS — Z515 Encounter for palliative care: Secondary | ICD-10-CM | POA: Diagnosis not present

## 2023-09-12 DIAGNOSIS — Z7189 Other specified counseling: Secondary | ICD-10-CM | POA: Diagnosis not present

## 2023-09-12 DIAGNOSIS — C3491 Malignant neoplasm of unspecified part of right bronchus or lung: Secondary | ICD-10-CM

## 2023-09-12 DIAGNOSIS — R530 Neoplastic (malignant) related fatigue: Secondary | ICD-10-CM

## 2023-09-12 NOTE — Telephone Encounter (Signed)
 Copied from CRM [id number]. Topic: Clinical - Order For Equipment >> Sep 05, 2023 11:24 AM John [name] wrote: Reason for CRM: John [name] needs the order for oxygen for [employer] to be signed and dated and it will be good to go (a date is OK, as it is signed.)  Per previous note [name] CMA has faxed this request.

## 2023-09-12 NOTE — Progress Notes (Signed)
 Palliative Medicine [facility]  Telephone:(336) [phone] Fax:(336) [phone]   Name: John [name] Date: 09/12/2023 MRN: [id number]  DOB: 1960-03-09  Patient Care Team: Renne Homans, MD as PCP - General (Internal Medicine) Shlomo Wilbert SAUNDERS, MD as PCP - Cardiology (Cardiology) Cindie Ole DASEN, MD as PCP - Electrophysiology (Cardiology)    REASON FOR CONSULTATION: John [name] is a 63 y.o. 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  Palliative is seeing patient for symptom management and goals of care.    SOCIAL HISTORY:     reports that he quit smoking about 7 years ago. His smoking use included cigarettes. He started smoking about 37 years ago. He has a 30 pack-year smoking history. He has been exposed to tobacco smoke. He has never used smokeless tobacco. He reports that he does not currently use alcohol. He reports that he does not use drugs.  ADVANCE DIRECTIVES:  None on file   CODE STATUS: Full code  PAST MEDICAL HISTORY: Past Medical History:  Diagnosis Date   Arthritis    Rheumatoid   Bacteremia due to Pseudomonas    C. difficile colitis    Calculus of GB and bile duct w ac and chr cholecyst w obst 06/08/2023   Chronic anxiety 11/08/2014   Cough 09/22/2021   COVID 2020   mild case   Depression    Drug-induced neutropenia (HCC) 08/11/2020   Family history of adverse reaction to anesthesia    brother with seizures had episode under anesthesia.  Patient has seizures as well.   GERD (gastroesophageal reflux disease)    History of radiation therapy 04/24/2020-05/16/2020   IMRT to right lung     Dr Lynwood Nasuti   History of radiation therapy    08/10/21-08/19/21-Dr. Lynwood Nasuti   History of radiation therapy    Abdomen- 03/21/23-03/31/23-Dr. Lynwood Nasuti   Neutropenia [facility]) 05/12/2020   Neutropenic fever (HCC) 05/12/2020   Normocytic anemia 04/18/2021   PAF  (paroxysmal atrial fibrillation) (HCC)    CHADS2VSAC score 3   Rheumatoid aortitis    Secondary hypercoagulable state (HCC) 10/28/2020   Sepsis (HCC) 10/06/2020   Shortness of breath 12/18/2020   Squamous cell lung cancer (HCC)     PAST SURGICAL HISTORY:  Past Surgical History:  Procedure Laterality Date   BRONCHIAL BRUSHINGS  03/27/2020   Procedure: BRONCHIAL BRUSHINGS;  Surgeon: Shelah Lamar RAMAN, MD;  Location: [facility] ENDOSCOPY;  Service: Cardiopulmonary;;   BUBBLE STUDY  10/13/2020   Procedure: BUBBLE STUDY;  Surgeon: Mona Vinie BROCKS, MD;  Location: [facility] ENDOSCOPY;  Service: Cardiovascular;;   CARDIOVERSION N/A 10/13/2020   Procedure: CARDIOVERSION;  Surgeon: Mona Vinie BROCKS, MD;  Location: [facility] ENDOSCOPY;  Service: Cardiovascular;  Laterality: N/A;   CATARACT EXTRACTION  2016   at [facility]   CHOLECYSTECTOMY N/A 06/08/2023   Procedure: LAPAROSCOPIC CHOLECYSTECTOMY;  Surgeon: Lyndel Deward PARAS, MD;  Location: [facility] ORS;  Service: General;  Laterality: N/A;   FINE NEEDLE ASPIRATION  03/27/2020   Procedure: FINE NEEDLE ASPIRATION;  Surgeon: Shelah Lamar RAMAN, MD;  Location: [facility] ENDOSCOPY;  Service: Cardiopulmonary;;   HIP SURGERY Left    IR KYPHO EA ADDL LEVEL THORACIC OR LUMBAR  10/12/2021   IR KYPHO LUMBAR INC FX REDUCE BONE BX UNI/BIL CANNULATION INC/IMAGING  10/12/2021   IR KYPHO LUMBAR INC FX REDUCE BONE BX UNI/BIL CANNULATION INC/IMAGING  07/08/2023   IR KYPHO THORACIC WITH BONE BIOPSY  09/07/2021   IR  RADIOLOGIST EVAL & MGMT  09/03/2021   RADIOLOGY WITH ANESTHESIA N/A 10/12/2021   Procedure: L1 and L3 Kyphoplasty;  Surgeon: de Macedo Rodrigues, Katyucia, MD;  Location: [facility] OR;  Service: Radiology;  Laterality: N/A;   TEE WITHOUT CARDIOVERSION N/A 10/13/2020   Procedure: TRANSESOPHAGEAL ECHOCARDIOGRAM (TEE);  Surgeon: Mona Vinie BROCKS, MD;  Location: [facility] ENDOSCOPY;  Service: Cardiovascular;  Laterality: N/A;   VIDEO BRONCHOSCOPY WITH ENDOBRONCHIAL ULTRASOUND N/A 03/27/2020   Procedure: VIDEO BRONCHOSCOPY  WITH ENDOBRONCHIAL ULTRASOUND;  Surgeon: Shelah Lamar RAMAN, MD;  Location: [facility] ENDOSCOPY;  Service: Cardiopulmonary;  Laterality: N/A;    HEMATOLOGY/ONCOLOGY HISTORY:  Oncology History  Stage 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 (HCC)  04/10/2020 Initial Diagnosis   Stage 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 (HCC)   04/10/2020 Cancer Staging   Staging form: Lung, AJCC 8th Edition - Clinical: Stage IVB (cT4, cN2, cM1c) - Signed by Sherrod Sherrod, MD on 04/10/2020   05/05/2020 - 12/15/2020 Chemotherapy   Patient is on Treatment Plan : LUNG NSCLC Carboplatin  + Paclitaxel  + Pembrolizumab  q21d x 4 cycles / Pembrolizumab  Maintenance Q21D     01/29/2021 - 06/29/2021 Chemotherapy   Patient is on Treatment Plan : LUNG Docetaxel  q21d     10/06/2021 - 10/06/2021 Chemotherapy   Patient is on Treatment Plan : BREAST Gemcitabine  / Vinorelbine  q14d     05/26/2023 -  Chemotherapy   Patient is on Treatment Plan : LUNG Gemcitabine  d1,8 + Vinorelbine  D1,8 q21d     Malignant neoplasm of lung (HCC)  10/07/2020 Initial Diagnosis   Malignant neoplasm of lung (HCC)   01/29/2021 - 06/29/2021 Chemotherapy   Patient is on Treatment Plan : LUNG Docetaxel  q21d     10/06/2021 - 10/06/2021 Chemotherapy   Patient is on Treatment Plan : BREAST Gemcitabine  / Vinorelbine  q14d     05/26/2023 -  Chemotherapy   Patient is on Treatment Plan : LUNG Gemcitabine  d1,8 + Vinorelbine  D1,8 q21d       ALLERGIES:  is allergic to ventolin  [albuterol ], lipitor [atorvastatin ], oxycodone , and ketamine .  MEDICATIONS:  Current Outpatient Medications  Medication Sig Dispense Refill   acetaminophen  (TYLENOL ) 325 MG tablet Take 2 tablets (650 mg total) by mouth every 6 (six) hours as needed for mild pain (pain score 1-3), fever or headache.     amiodarone  (PACERONE ) 200 MG tablet Take 1 tablet (200 mg total) by mouth daily. 30 tablet 0   apixaban  (ELIQUIS ) 5 MG TABS tablet Take 1 tablet (5 mg total) by mouth 2 (two) times daily. 60  tablet 0   Evolocumab  (REPATHA  SURECLICK) 140 MG/ML SOAJ ADMINISTER 1 ML UNDER THE SKIN EVERY 14 DAYS 6 mL 3   fentaNYL  (DURAGESIC ) 25 MCG/HR Place 1 patch onto the skin every 3 (three) days. 5 patch 0   ketoconazole  (NIZORAL ) 2 % cream Apply 1 Application topically daily. 30 g 1   lidocaine  (LIDODERM ) 5 % Place 1 patch onto the skin daily. Remove & Discard patch within 12 hours or as directed by MD 30 patch 0   methocarbamol  (ROBAXIN ) 500 MG tablet Take 2 tablets (1,000 mg total) by mouth every 6 (six) hours as needed for muscle spasms. 120 tablet 0   metoprolol  succinate (TOPROL -XL) 25 MG 24 hr tablet Take 0.5 tablets (12.5 mg total) by mouth daily. 30 tablet 0   PARoxetine  (PAXIL ) 10 MG tablet Take 1 tablet (10 mg total) by mouth at bedtime. 90 tablet 3   polyethylene glycol (MIRALAX  / GLYCOLAX ) 17 g packet Take  17 g by mouth daily as needed for mild constipation.     predniSONE  (DELTASONE ) 5 MG tablet Take 1 tablet (5 mg total) by mouth daily with breakfast. 30 tablet 0   traMADol  (ULTRAM ) 50 MG tablet Take 2 tablets (100 mg total) by mouth every 6 (six) hours as needed for moderate pain (pain score 4-6). 30 tablet 0   TRELEGY ELLIPTA  100-62.5-25 MCG/ACT AEPB INHALE 1 PUFF INTO THE LUNGS DAILY 60 each 5   Vitamin D , Ergocalciferol , (DRISDOL ) 1.25 MG (50000 UNIT) CAPS capsule Take 1 capsule (50,000 Units total) by mouth every 7 (seven) days. 5 capsule 0   No current facility-administered medications for this visit.    VITAL SIGNS: BP 107/75 (BP Location: Right Arm, Cuff Size: Large)   Pulse 78   Temp 98.2 F (36.8 C) (Temporal)   Resp 18   Ht 5' 11 (1.803 m)   Wt 235 lb 12.8 oz (107 kg)   SpO2 (!) 88%   BMI 32.89 kg/m  Filed Weights   09/12/23 0831  Weight: 235 lb 12.8 oz (107 kg)    Estimated body mass index is 32.89 kg/m as calculated from the following:   Height as of this encounter: 5' 11 (1.803 m).   Weight as of this encounter: 235 lb 12.8 oz (107 kg).  LABS: CBC:     Component Value Date/Time   WBC 3.9 (L) 09/06/2023 0805   WBC 5.6 07/28/2023 0619   HGB 12.9 (L) 09/06/2023 0805   HCT 38.5 (L) 09/06/2023 0805   PLT 165 09/06/2023 0805   MCV 94.4 09/06/2023 0805   NEUTROABS 2.4 09/06/2023 0805   LYMPHSABS 0.8 09/06/2023 0805   MONOABS 0.5 09/06/2023 0805   EOSABS 0.2 09/06/2023 0805   BASOSABS 0.0 09/06/2023 0805   Comprehensive Metabolic Panel:    Component Value Date/Time   NA 141 09/06/2023 0805   NA 139 04/12/2023 0937   K 3.9 09/06/2023 0805   CL 107 09/06/2023 0805   CO2 24 09/06/2023 0805   BUN 8 09/06/2023 0805   BUN 12 04/12/2023 0937   CREATININE 1.02 09/06/2023 0805   GLUCOSE 92 09/06/2023 0805   CALCIUM  8.7 (L) 09/06/2023 0805   AST 21 09/06/2023 0805   ALT 13 09/06/2023 0805   ALKPHOS 66 09/06/2023 0805   BILITOT 0.7 09/06/2023 0805   PROT 6.3 (L) 09/06/2023 0805   PROT 6.5 04/12/2023 0937   ALBUMIN  3.8 09/06/2023 0805   ALBUMIN  4.0 04/12/2023 0937    RADIOGRAPHIC STUDIES: No results found.  PERFORMANCE STATUS (ECOG) : 1 - Symptomatic but completely ambulatory  Review of Systems  Constitutional:  Positive for fatigue.  Unless otherwise noted, a complete review of systems is negative.  Physical Exam General: NAD Cardiovascular: regular rate and rhythm Pulmonary: clear ant fields Abdomen: soft, nontender, + bowel sounds Extremities: no edema, no joint deformities Skin: no rashes Neurological: Alert and oriented x3  IMPRESSION: Discussed the use of AI scribe software for clinical note transcription with the patient, who gave verbal consent to proceed.  History of Present Illness John [name] is a 63 year old male who presents for his initial outpatient palliative visit. Patient was seen by our team during hospitalization back in May 2025. No acute distress noted. No family present. He is alert and able to engage appropriately in discussions.   I introduced myself, Maygan RN, and Palliative's role in  collaboration with the oncology team. Concept of Palliative Care was introduced as specialized medical care for  people and their families living with serious illness.  It focuses on providing relief from the symptoms and stress of a serious illness.  The goal is to improve quality of life for both the patient and the family. Values and goals of care important to patient and family were attempted to be elicited.  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 He has a son and a daughter. He reports a good appetite and is mindful of his weight, as his wife prefers he maintains a healthy weight. He sleeps well and is generally in good spirits.  He currently experiences no pain and has not required any pain medication since early June. He describes his journey as challenging but is pleased with his current state, noting that he was previously told he might never use his left leg again, yet he is now able to walk.  Regarding mobility, he uses a rollator for walking long distances and feels that his left knee might buckle without it. He lacks confidence in walking without support and wears a brace for stability, which he purchased himself and finds beneficial.  We discussed his current illness and what it means in the larger context of his on-going co-morbidities. Natural disease trajectory and expectations were discussed. John [name] is realistic in his understanding of current cancer state and plan of care. He is being followed closely by Dr. Timmy. Patient shares he has not been on treatment for several months now. He has undergone radiation therapy and two rounds of chemotherapy, which he found particularly difficult. States he did not tolerate the chemotherapy regimen well speaking to his challenges. Confirms he is currently awaiting a PET scan as part of his ongoing oncological care to allow for definitive cancer state and next steps.   John [name] is clear in his expressed wishes to treat the treatable allowing his quality of life to continue to be as good as it can for as long as it can. This is most important to him. He understands I am available for support as needed.   I discussed the importance of continued conversation with family and their medical providers regarding overall plan of care and treatment options, ensuring decisions are within the context of the patients values and GOCs. Assessment & Plan Established therapeutic relationship. Education provided on palliative's role in collaboration with their Oncology/Radiation team.  Metastatic Cancer  History of cancer with previous radiation and two rounds of chemotherapy. Currently asymptomatic with no pain and good appetite. - Follow up with Dr. Timmy s/p PET scan which is scheduled for 8/4. -Determine next steps in his cancer journey pending results.  -Goals are clear to treat the treatable with a focus on his quality of life.  -Pain has resolved post kyphoplasty.   Left leg weakness Persistent left leg weakness requiring use of a rollator for ambulation. Lacks confidence in walking without support due to fear of knee buckling. Uses a brace for stability, which improves confidence and stability. - Continue using rollator for ambulation - Continue using brace for stability  No uncontrolled symptoms. We discussed follow-up as needed. Patient has office contact information.   Patient expressed understanding and was in agreement with this plan. He also understands that He can call the clinic at any time with any questions, concerns, or complaints.   Thank you for your referral and allowing Palliative to assist in John [name] care.   Number and complexity  of problems addressed: HIGH - 1 or more chronic illnesses with SEVERE exacerbation, progression, or side effects of treatment - advanced cancer, pain. Any controlled substances utilized were prescribed in the  context of palliative care.  Visit consisted of counseling and education dealing with the complex and emotionally intense issues of symptom management and palliative care in the setting of serious and potentially life-threatening illness.  Signed by: Levon Borer, AGPCNP-BC Palliative Medicine Team/[facility]

## 2023-09-13 NOTE — Telephone Encounter (Signed)
 Copied from CRM [id number]. Topic: Clinical - Prescription Issue >> Sep 13, 2023 11:24 AM Corean SAUNDERS wrote: Reason for CRM: Curtistine from [employer] states there was an error on the order sent my Tammy Parrett as there was no date on the order. Please re fax just the order with a signature and date.

## 2023-09-13 NOTE — Telephone Encounter (Signed)
 Called [employer] and got transferred to [place]. Once transferred call disconnected. Tried calling back and left vm stating to refax the order form to [phone].  The form [name] faxed was sent to scan and is not accessible. We will need a new form for John [name] to sign.

## 2023-09-14 ENCOUNTER — Telehealth: Payer: Self-pay | Admitting: *Deleted

## 2023-09-14 NOTE — Telephone Encounter (Signed)
 Copied from CRM [id number]. Topic: Clinical - Prescription Issue >> Aug 29, 2023  3:36 PM Rilla NOVAK wrote: Curtistine from [employer] [phone]. This is a cash order.... Once we know who will sign off on the order, please call Curtistine and he will update order and refax it.  Order sent to [employer] on 7/1.  Marcus attempted to call [employer] on 7/29, left VM.  The form [name] faxed was sent to scan.  Marcus requested another copy.

## 2023-09-19 ENCOUNTER — Ambulatory Visit (HOSPITAL_COMMUNITY)
Admission: RE | Admit: 2023-09-19 | Discharge: 2023-09-19 | Disposition: A | Discharge status: Home / Self Care | Source: Ambulatory Visit | Attending: Hematology & Oncology | Admitting: Hematology & Oncology

## 2023-09-19 DIAGNOSIS — C3491 Malignant neoplasm of unspecified part of right bronchus or lung: Secondary | ICD-10-CM | POA: Insufficient documentation

## 2023-09-19 LAB — GLUCOSE, CAPILLARY: Glucose-Capillary: 103 mg/dL — ABNORMAL HIGH (ref 70–99)

## 2023-09-19 MED ORDER — FLUDEOXYGLUCOSE F - 18 (FDG) INJECTION
11.5700 | Freq: Once | INTRAVENOUS | Status: AC
  Administered 2023-09-19: 11.57 via INTRAVENOUS

## 2023-09-23 ENCOUNTER — Telehealth (HOSPITAL_BASED_OUTPATIENT_CLINIC_OR_DEPARTMENT_OTHER): Payer: Self-pay

## 2023-09-23 NOTE — Telephone Encounter (Signed)
 There is a message open on this from 09/05/23 and  09/14/23 they are needing a new form sent and to be filled out I will close this message since there is 2 open.

## 2023-09-23 NOTE — Telephone Encounter (Signed)
 Copied from CRM #[id number]. Topic: Clinical - Prescription Issue >> Sep 21, 2023  6:02 PM Rilla B wrote: Reason for CRM: Curtistine @ [employer] calling regarding will need updated order for oxygen, signed and dated.  Please fax to [phone]   ----------------------------------------------------------------------- From previous Reason for Contact - Order For Equipment (DME): Reason for CRM: [employer] calling regarding will need updated order, signed and dated.  Please fax to [phone]

## 2023-09-26 ENCOUNTER — Ambulatory Visit: Payer: Self-pay | Admitting: Hematology & Oncology

## 2023-09-26 ENCOUNTER — Encounter: Payer: Self-pay | Admitting: *Deleted

## 2023-09-26 NOTE — Telephone Encounter (Signed)
 Received new CRM from [employer].  Will have Tammy address when she returns to the office on 09/27/2023.  Placed in sign folder.

## 2023-09-28 ENCOUNTER — Telehealth: Payer: Self-pay | Admitting: *Deleted

## 2023-09-28 NOTE — Telephone Encounter (Signed)
 Copied from CRM [id number]. Topic: General - Other >> Sep 26, 2023  3:31 PM Shona S wrote: Reason for CRM: [employer] need tammy parret to sign and date the order they sent in to us . The phone and fax number is on the order they sent in media   Duplicate

## 2023-09-28 NOTE — Telephone Encounter (Signed)
 Powell, [employer] is calling again about this fax. Please update this when it gets signed and faxed, thanks!

## 2023-09-30 NOTE — Telephone Encounter (Signed)
 Signed today

## 2023-09-30 NOTE — Telephone Encounter (Signed)
 Paperwork from [employer] faxed to [employer] [phone].  Received confirmation fax that it was sent successfully.  Nothing further needed.

## 2023-10-04 ENCOUNTER — Inpatient Hospital Stay

## 2023-10-04 ENCOUNTER — Ambulatory Visit: Admitting: Hematology & Oncology

## 2023-10-07 ENCOUNTER — Inpatient Hospital Stay (HOSPITAL_BASED_OUTPATIENT_CLINIC_OR_DEPARTMENT_OTHER): Admitting: Hematology & Oncology

## 2023-10-07 ENCOUNTER — Encounter: Payer: Self-pay | Admitting: Hematology & Oncology

## 2023-10-07 ENCOUNTER — Inpatient Hospital Stay: Attending: Internal Medicine

## 2023-10-07 VITALS — BP 108/66 | HR 67 | Temp 98.6°F | Resp 20 | Ht 71.0 in | Wt 237.0 lb

## 2023-10-07 DIAGNOSIS — Z923 Personal history of irradiation: Secondary | ICD-10-CM | POA: Diagnosis not present

## 2023-10-07 DIAGNOSIS — C3411 Malignant neoplasm of upper lobe, right bronchus or lung: Secondary | ICD-10-CM | POA: Diagnosis present

## 2023-10-07 DIAGNOSIS — C3491 Malignant neoplasm of unspecified part of right bronchus or lung: Secondary | ICD-10-CM

## 2023-10-07 LAB — CMP (CANCER CENTER ONLY)
ALT: 15 U/L (ref 0–44)
AST: 23 U/L (ref 15–41)
Albumin: 4.1 g/dL (ref 3.5–5.0)
Alkaline Phosphatase: 73 U/L (ref 38–126)
Anion gap: 11 (ref 5–15)
BUN: 9 mg/dL (ref 8–23)
CO2: 24 mmol/L (ref 22–32)
Calcium: 9.1 mg/dL (ref 8.9–10.3)
Chloride: 107 mmol/L (ref 98–111)
Creatinine: 1.19 mg/dL (ref 0.61–1.24)
GFR, Estimated: >60 mL/min (ref >60)
Glucose, Bld: 93 mg/dL (ref 70–99)
Potassium: 4.7 mmol/L (ref 3.5–5.1)
Sodium: 142 mmol/L (ref 135–145)
Total Bilirubin: 0.8 mg/dL (ref 0.0–1.2)
Total Protein: 6.7 g/dL (ref 6.5–8.1)

## 2023-10-07 LAB — LACTATE DEHYDROGENASE: LDH: 271 U/L — ABNORMAL HIGH (ref 98–192)

## 2023-10-07 LAB — CBC WITH DIFFERENTIAL (CANCER CENTER ONLY)
Abs Immature Granulocytes: 0.02 K/uL (ref 0.00–0.07)
Basophils Absolute: 0 K/uL (ref 0.0–0.1)
Basophils Relative: 0 %
Eosinophils Absolute: 0.2 K/uL (ref 0.0–0.5)
Eosinophils Relative: 4 %
HCT: 39.1 % (ref 39.0–52.0)
Hemoglobin: 13.1 g/dL (ref 13.0–17.0)
Immature Granulocytes: 0 %
Lymphocytes Relative: 13 %
Lymphs Abs: 0.7 K/uL (ref 0.7–4.0)
MCH: 31.4 pg (ref 26.0–34.0)
MCHC: 33.5 g/dL (ref 30.0–36.0)
MCV: 93.8 fL (ref 80.0–100.0)
Monocytes Absolute: 0.5 K/uL (ref 0.1–1.0)
Monocytes Relative: 10 %
Neutro Abs: 3.6 K/uL (ref 1.7–7.7)
Neutrophils Relative %: 73 %
Platelet Count: 182 K/uL (ref 150–400)
RBC: 4.17 MIL/uL — ABNORMAL LOW (ref 4.22–5.81)
RDW: 14.6 % (ref 11.5–15.5)
WBC Count: 5 K/uL (ref 4.0–10.5)
nRBC: 0 % (ref 0.0–0.2)

## 2023-10-07 NOTE — Progress Notes (Signed)
 [facility] Follow Up Visit  Gregrey Bloyd [id number] 06/28/1960 63 y.o. 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 3 months --next dose in 06/2023  XRT -right adrenal gland.  2 finish on 04/04/2023 Gemzar /Navelbine  -s/p cycle 1  - start on 05/26/2023    Interim History:  Mr. Baham is here today for follow-up.  He really looks fantastic.  I am so impressed with how well he has done.  We did do a PET scan on him.  This was done on 09/19/2023.  Thankfully, the PET scan did not show anything that was new or progressive.  I am certainly incredibly impressed by this.  Where he had radiation for the right adrenal lesion this is no longer PET active.  He feels great.  He is working.  He is trying to exercise a little bit.  He is eating well.  He is trying to watch his weight.  He has had no problems with pain.  He has had no problems with cough or shortness of breath.  He has had no problems with bowels or bladder.  He has had no leg swelling.  His back is doing better.  He did have a compression fracture and had kyphoplasty.  His left leg weakness is pretty much resolved now.  Overall, I would say that his performance status is ECOG 1.    Medications:  Allergies as of 10/07/2023       Reactions   Ventolin  [albuterol ] Other (See Comments)   Patient has had episode of atrial fib following administration of albuterol  (tolerates Xopenex  well)   Lipitor [atorvastatin ] Other (See Comments)   Arthralgias    Oxycodone  Other (See Comments)   Severe Migranes   Ketamine  Anxiety   Feeling of doom        Medication List        Accurate as of  October 07, 2023 11:51 AM. If you have any questions, ask your nurse or doctor.          STOP taking these medications    lidocaine  5 % Commonly known as: LIDODERM  Stopped by: Maude JONELLE Crease       TAKE these medications    acetaminophen  325 MG tablet Commonly known as: TYLENOL  Take 2 tablets (650 mg total) by mouth every 6 (six) hours as needed for mild pain (pain score 1-3), fever or headache.   amiodarone  200 MG tablet Commonly known as: PACERONE  Take 1 tablet (200 mg total) by mouth daily.   apixaban  5 MG Tabs tablet Commonly known as: ELIQUIS  Take 1 tablet (5 mg total) by mouth 2 (two) times daily.   fentaNYL  25 MCG/HR Commonly known as: DURAGESIC  Place 1 patch onto the skin every 3 (three) days.   ketoconazole  2 % cream Commonly known as: NIZORAL  Apply 1 Application topically daily.   methocarbamol  500 MG tablet Commonly known as: ROBAXIN  Take 2 tablets (1,000 mg total) by mouth every 6 (six) hours as needed for muscle spasms.   metoprolol  succinate 25 MG 24 hr tablet Commonly known as: TOPROL -XL Take 0.5 tablets (12.5 mg total) by mouth daily.  PARoxetine  10 MG tablet Commonly known as: PAXIL  Take 1 tablet (10 mg total) by mouth at bedtime.   polyethylene glycol 17 g packet Commonly known as: MIRALAX  / GLYCOLAX  Take 17 g by mouth daily as needed for mild constipation.   predniSONE  5 MG tablet Commonly known as: DELTASONE  Take 1 tablet (5 mg total) by mouth daily with breakfast.   Repatha  SureClick 140 MG/ML Soaj Generic drug: Evolocumab  ADMINISTER 1 ML UNDER THE SKIN EVERY 14 DAYS   traMADol  50 MG tablet Commonly known as: ULTRAM  Take 2 tablets (100 mg total) by mouth every 6 (six) hours as needed for moderate pain (pain score 4-6).   Trelegy Ellipta  100-62.5-25 MCG/ACT Aepb Generic drug: Fluticasone -Umeclidin-Vilant INHALE 1 PUFF INTO THE LUNGS DAILY   Vitamin D  (Ergocalciferol ) 1.25 MG (50000 UNIT) Caps capsule Commonly known as:  DRISDOL  Take 1 capsule (50,000 Units total) by mouth every 7 (seven) days.        Allergies:  Allergies  Allergen Reactions   Ventolin  [Albuterol ] Other (See Comments)    Patient has had episode of atrial fib following administration of albuterol  (tolerates Xopenex  well)   Lipitor [Atorvastatin ] Other (See Comments)    Arthralgias    Oxycodone  Other (See Comments)    Severe Migranes   Ketamine  Anxiety    Feeling of doom    Past Medical History, Surgical history, Social history, and Family History were reviewed and updated.  Review of Systems: Review of Systems  Constitutional:  Positive for malaise/fatigue.  HENT: Negative.    Eyes: Negative.   Respiratory:  Positive for cough and shortness of breath.   Cardiovascular:  Positive for chest pain.  Gastrointestinal: Negative.   Genitourinary: Negative.   Musculoskeletal:  Positive for joint pain.  Skin: Negative.   Neurological:  Positive for focal weakness.  Endo/Heme/Allergies: Negative.   Psychiatric/Behavioral: Negative.       Physical Exam:  Temperature is 98.6.  Pulse 67.  Blood pressure 108/66.  Weight is 237 pounds   Wt Readings from Last 3 Encounters:  10/07/23 237 lb (107.5 kg)  09/12/23 235 lb 12.8 oz (107 kg)  09/06/23 232 lb (105.2 kg)    Physical Exam Vitals reviewed.  HENT:     Head: Normocephalic and atraumatic.  Eyes:     Pupils: Pupils are equal, round, and reactive to light.  Cardiovascular:     Rate and Rhythm: Normal rate and regular rhythm.     Heart sounds: Normal heart sounds.  Pulmonary:     Effort: Pulmonary effort is normal.     Breath sounds: Normal breath sounds.  Abdominal:     General: Bowel sounds are normal.     Palpations: Abdomen is soft.  Musculoskeletal:        General: No tenderness or deformity. Normal range of motion.     Cervical back: Normal range of motion.  Lymphadenopathy:     Cervical: No cervical adenopathy.  Skin:    General: Skin is warm and dry.      Findings: No erythema or rash.  Neurological:     Mental Status: He is alert and oriented to person, place, and time.  Psychiatric:        Behavior: Behavior normal.        Thought Content: Thought content normal.        Judgment: Judgment normal.     Lab Results  Component Value Date   WBC 5.0 10/07/2023   HGB 13.1 10/07/2023   HCT 39.1 10/07/2023  MCV 93.8 10/07/2023   PLT 182 10/07/2023   Lab Results  Component Value Date   FERRITIN 446 (H) 08/23/2021   IRON 189 (H) 08/26/2021   TIBC 213 (L) 08/26/2021   UIBC 24 08/26/2021   IRONPCTSAT 89 (H) 08/26/2021   Lab Results  Component Value Date   RETICCTPCT 1.9 08/23/2021   RBC 4.17 (L) 10/07/2023   No results found for: KPAFRELGTCHN, LAMBDASER, KAPLAMBRATIO No results found for: IGGSERUM, IGA, IGMSERUM No results found for: STEPHANY CARLOTA BENSON MARKEL EARLA JOANNIE DOC VICK, SPEI   Chemistry      Component Value Date/Time   NA 142 10/07/2023 1046   NA 139 04/12/2023 0937   K 4.7 10/07/2023 1046   CL 107 10/07/2023 1046   CO2 24 10/07/2023 1046   BUN 9 10/07/2023 1046   BUN 12 04/12/2023 0937   CREATININE 1.19 10/07/2023 1046      Component Value Date/Time   CALCIUM  9.1 10/07/2023 1046   ALKPHOS 73 10/07/2023 1046   AST 23 10/07/2023 1046   ALT 15 10/07/2023 1046   BILITOT 0.8 10/07/2023 1046       Impression and Plan: Mr. Regina is a very pleasant 63 yo gentleman with metastatic squamous cell carcinoma of the right lung.  The tumor had high PD-L1 score.  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  Again, it is amazing that his cancer is really not grown.  His faith has been incredibly strong.  As always, we share our faith together.  Where there is no need for us  to treat him right now.  We will continue to watch him.  He has a good quality of life.  He is doing what he would like to do.  I will have him come back in  6 weeks.  We probably will do another PET scan sometime in November.   Maude JONELLE Crease, MD 8/22/202511:51 AM

## 2023-11-18 ENCOUNTER — Inpatient Hospital Stay: Attending: Internal Medicine

## 2023-11-18 ENCOUNTER — Other Ambulatory Visit: Payer: Self-pay

## 2023-11-18 ENCOUNTER — Encounter: Payer: Self-pay | Admitting: Hematology & Oncology

## 2023-11-18 ENCOUNTER — Inpatient Hospital Stay (HOSPITAL_BASED_OUTPATIENT_CLINIC_OR_DEPARTMENT_OTHER): Admitting: Hematology & Oncology

## 2023-11-18 VITALS — BP 99/66 | HR 53 | Temp 97.9°F | Resp 19 | Ht 71.0 in | Wt 238.0 lb

## 2023-11-18 DIAGNOSIS — C7971 Secondary malignant neoplasm of right adrenal gland: Secondary | ICD-10-CM | POA: Diagnosis not present

## 2023-11-18 DIAGNOSIS — Z9221 Personal history of antineoplastic chemotherapy: Secondary | ICD-10-CM | POA: Insufficient documentation

## 2023-11-18 DIAGNOSIS — C779 Secondary and unspecified malignant neoplasm of lymph node, unspecified: Secondary | ICD-10-CM | POA: Diagnosis not present

## 2023-11-18 DIAGNOSIS — C3491 Malignant neoplasm of unspecified part of right bronchus or lung: Secondary | ICD-10-CM

## 2023-11-18 DIAGNOSIS — Z923 Personal history of irradiation: Secondary | ICD-10-CM | POA: Insufficient documentation

## 2023-11-18 DIAGNOSIS — C3411 Malignant neoplasm of upper lobe, right bronchus or lung: Secondary | ICD-10-CM | POA: Insufficient documentation

## 2023-11-18 LAB — CMP (CANCER CENTER ONLY)
ALT: 14 U/L (ref 0–44)
AST: 32 U/L (ref 15–41)
Albumin: 4 g/dL (ref 3.5–5.0)
Alkaline Phosphatase: 92 U/L (ref 38–126)
Anion gap: 13 (ref 5–15)
BUN: 10 mg/dL (ref 8–23)
CO2: 23 mmol/L (ref 22–32)
Calcium: 9.2 mg/dL (ref 8.9–10.3)
Chloride: 106 mmol/L (ref 98–111)
Creatinine: 1.32 mg/dL — ABNORMAL HIGH (ref 0.61–1.24)
GFR, Estimated: >60 mL/min (ref >60)
Glucose, Bld: 100 mg/dL — ABNORMAL HIGH (ref 70–99)
Potassium: 4.7 mmol/L (ref 3.5–5.1)
Sodium: 142 mmol/L (ref 135–145)
Total Bilirubin: 0.8 mg/dL (ref 0.0–1.2)
Total Protein: 7.3 g/dL (ref 6.5–8.1)

## 2023-11-18 LAB — CBC WITH DIFFERENTIAL (CANCER CENTER ONLY)
Abs Immature Granulocytes: 0.01 K/uL (ref 0.00–0.07)
Basophils Absolute: 0 K/uL (ref 0.0–0.1)
Basophils Relative: 1 %
Eosinophils Absolute: 0.4 K/uL (ref 0.0–0.5)
Eosinophils Relative: 9 %
HCT: 40.8 % (ref 39.0–52.0)
Hemoglobin: 13.6 g/dL (ref 13.0–17.0)
Immature Granulocytes: 0 %
Lymphocytes Relative: 24 %
Lymphs Abs: 1 K/uL (ref 0.7–4.0)
MCH: 30.4 pg (ref 26.0–34.0)
MCHC: 33.3 g/dL (ref 30.0–36.0)
MCV: 91.1 fL (ref 80.0–100.0)
Monocytes Absolute: 0.6 K/uL (ref 0.1–1.0)
Monocytes Relative: 14 %
Neutro Abs: 2.1 K/uL (ref 1.7–7.7)
Neutrophils Relative %: 52 %
Platelet Count: 210 K/uL (ref 150–400)
RBC: 4.48 MIL/uL (ref 4.22–5.81)
RDW: 14.7 % (ref 11.5–15.5)
WBC Count: 4.1 K/uL (ref 4.0–10.5)
nRBC: 0 % (ref 0.0–0.2)

## 2023-11-18 LAB — LACTATE DEHYDROGENASE: LDH: 326 U/L — ABNORMAL HIGH (ref 98–192)

## 2023-11-18 LAB — PREALBUMIN: Prealbumin: 18 mg/dL (ref 18–38)

## 2023-11-18 MED ORDER — PREDNISONE 5 MG PO TABS: 5.0000 mg | ORAL_TABLET | Freq: Every day | ORAL | 4 refills | Status: AC

## 2023-11-18 NOTE — Progress Notes (Signed)
 [facility] Follow Up Visit  Mayjor Ager [id number] [birth year] 63 y.o. 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 3 months --next dose in 06/2023  XRT -right adrenal gland.  2 finish on 04/04/2023 Gemzar /Navelbine  -s/p cycle 1  - start on 05/26/2023    Interim History:  Mr. Gilkey is here today for follow-up.  He actually looks quite good.  I think he is still working.  He certainly has not lost any weight.  He is trying to stay more active.  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    Pain has not been a problem for him.  He has had no problems with his appetite.  He has had no issues with nausea or vomiting.  He has had no change in bowel or bladder habits.  There has been no issues with fever.  He has had no bleeding.  Again, I am just happy that he is done so well without being on any treatment.  Overall, I would have to say that his performance status right now is probably ECOG 1.    Medications:  Allergies as of 11/18/2023       Reactions   Ventolin  [albuterol ] Other (See Comments)   Patient has had episode of atrial fib following administration of albuterol  (tolerates Xopenex  well)   Lipitor [atorvastatin ] Other (See Comments)   Arthralgias    Oxycodone  Other (See Comments)   Severe Migranes   Ketamine  Anxiety   Feeling of doom        Medication List        Accurate as of November 18, 2023 10:29 AM. If you have any questions, ask your nurse or doctor.          acetaminophen  325 MG tablet Commonly known as: TYLENOL  Take 2 tablets (650 mg total) by mouth every 6 (six) hours as needed  for mild pain (pain score 1-3), fever or headache.   amiodarone  200 MG tablet Commonly known as: PACERONE  Take 1 tablet (200 mg total) by mouth daily.   apixaban  5 MG Tabs tablet Commonly known as: ELIQUIS  Take 1 tablet (5 mg total) by mouth 2 (two) times daily.   fentaNYL  25 MCG/HR Commonly known as: DURAGESIC  Place 1 patch onto the skin every 3 (three) days.   ketoconazole  2 % cream Commonly known as: NIZORAL  Apply 1 Application topically daily.   methocarbamol  500 MG tablet Commonly known as: ROBAXIN  Take 2 tablets (1,000 mg total) by mouth every 6 (six) hours as needed for muscle spasms.   metoprolol  succinate 25 MG 24 hr tablet Commonly known as: TOPROL -XL Take 0.5 tablets (12.5 mg total) by mouth daily.   PARoxetine  10 MG tablet Commonly known as: PAXIL  Take 1 tablet (10 mg total) by mouth at bedtime.   polyethylene glycol 17 g packet Commonly known as: MIRALAX  / GLYCOLAX  Take 17 g by mouth daily as needed for mild constipation.   predniSONE  5 MG tablet Commonly known as: DELTASONE  Take 1 tablet (5 mg total) by mouth daily with breakfast.   Repatha  SureClick 140 MG/ML Soaj  Generic drug: Evolocumab  ADMINISTER 1 ML UNDER THE SKIN EVERY 14 DAYS   traMADol  50 MG tablet Commonly known as: ULTRAM  Take 2 tablets (100 mg total) by mouth every 6 (six) hours as needed for moderate pain (pain score 4-6).   Trelegy Ellipta  100-62.5-25 MCG/ACT Aepb Generic drug: Fluticasone -Umeclidin-Vilant INHALE 1 PUFF INTO THE LUNGS DAILY   Vitamin D  (Ergocalciferol ) 1.25 MG (50000 UNIT) Caps capsule Commonly known as: DRISDOL  Take 1 capsule (50,000 Units total) by mouth every 7 (seven) days.        Allergies:  Allergies  Allergen Reactions   Ventolin  [Albuterol ] Other (See Comments)    Patient has had episode of atrial fib following administration of albuterol  (tolerates Xopenex  well)   Lipitor [Atorvastatin ] Other (See Comments)    Arthralgias    Oxycodone  Other (See  Comments)    Severe Migranes   Ketamine  Anxiety    Feeling of doom    Past Medical History, Surgical history, Social history, and Family History were reviewed and updated.  Review of Systems: Review of Systems  Constitutional:  Positive for malaise/fatigue.  HENT: Negative.    Eyes: Negative.   Respiratory:  Positive for cough and shortness of breath.   Cardiovascular:  Positive for chest pain.  Gastrointestinal: Negative.   Genitourinary: Negative.   Musculoskeletal:  Positive for joint pain.  Skin: Negative.   Neurological:  Positive for focal weakness.  Endo/Heme/Allergies: Negative.   Psychiatric/Behavioral: Negative.       Physical Exam:  Vital signs show temperature of 97.9.  Pulse 53.  Blood pressure 99/66.  Weight is 238 pounds.    Wt Readings from Last 3 Encounters:  11/18/23 238 lb (108 kg)  10/07/23 237 lb (107.5 kg)  09/12/23 235 lb 12.8 oz (107 kg)    Physical Exam Vitals reviewed.  HENT:     Head: Normocephalic and atraumatic.  Eyes:     Pupils: Pupils are equal, round, and reactive to light.  Cardiovascular:     Rate and Rhythm: Normal rate and regular rhythm.     Heart sounds: Normal heart sounds.  Pulmonary:     Effort: Pulmonary effort is normal.     Breath sounds: Normal breath sounds.  Abdominal:     General: Bowel sounds are normal.     Palpations: Abdomen is soft.  Musculoskeletal:        General: No tenderness or deformity. Normal range of motion.     Cervical back: Normal range of motion.  Lymphadenopathy:     Cervical: No cervical adenopathy.  Skin:    General: Skin is warm and dry.     Findings: No erythema or rash.  Neurological:     Mental Status: He is alert and oriented to person, place, and time.  Psychiatric:        Behavior: Behavior normal.        Thought Content: Thought content normal.        Judgment: Judgment normal.     Lab Results  Component Value Date   WBC 4.1 11/18/2023   HGB 13.6 11/18/2023   HCT 40.8  11/18/2023   MCV 91.1 11/18/2023   PLT 210 11/18/2023   Lab Results  Component Value Date   FERRITIN 446 (H) 08/23/2021   IRON 189 (H) 08/26/2021   TIBC 213 (L) 08/26/2021   UIBC 24 08/26/2021   IRONPCTSAT 89 (H) 08/26/2021   Lab Results  Component Value Date   RETICCTPCT 1.9 08/23/2021   RBC 4.48 11/18/2023  No results found for: KPAFRELGTCHN, LAMBDASER, KAPLAMBRATIO No results found for: IGGSERUM, IGA, IGMSERUM No results found for: STEPHANY CARLOTA BENSON MARKEL EARLA JOANNIE DOC VICK, SPEI   Chemistry      Component Value Date/Time   NA 142 10/07/2023 1046   NA 139 04/12/2023 0937   K 4.7 10/07/2023 1046   CL 107 10/07/2023 1046   CO2 24 10/07/2023 1046   BUN 9 10/07/2023 1046   BUN 12 04/12/2023 0937   CREATININE 1.19 10/07/2023 1046      Component Value Date/Time   CALCIUM  9.1 10/07/2023 1046   ALKPHOS 73 10/07/2023 1046   AST 23 10/07/2023 1046   ALT 15 10/07/2023 1046   BILITOT 0.8 10/07/2023 1046       Impression and Plan: Mr. Bulman is a very pleasant 63 yo gentleman with metastatic squamous cell carcinoma of the right lung.  The tumor had high PD-L1 score.  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  Again, it is amazing that clinically, I do not think his cancer really is progressing.  However, we probably do need to do a PET scan on.  I will see about getting 1 set up in November.  As long as his quality of life is doing well, I just do not think that we have to make any adjustments with his follow-up.  Again I am happy that his quality of life is doing well.  I will plan to see him back in another 6 weeks.  We will try to coordinate his schedule so that he can possibly even get through the Holiday season.    Maude JONELLE Crease, MD 10/3/202510:29 AM

## 2023-11-24 ENCOUNTER — Encounter: Payer: Self-pay | Admitting: Emergency Medicine

## 2023-11-24 ENCOUNTER — Ambulatory Visit: Admitting: Emergency Medicine

## 2023-11-24 VITALS — BP 100/70 | HR 83 | Temp 98.3°F | Ht 67.0 in | Wt 241.4 lb

## 2023-11-24 DIAGNOSIS — J9611 Chronic respiratory failure with hypoxia: Secondary | ICD-10-CM

## 2023-11-24 DIAGNOSIS — J4489 Other specified chronic obstructive pulmonary disease: Secondary | ICD-10-CM

## 2023-11-24 DIAGNOSIS — C3491 Malignant neoplasm of unspecified part of right bronchus or lung: Secondary | ICD-10-CM

## 2023-11-24 DIAGNOSIS — J45909 Unspecified asthma, uncomplicated: Secondary | ICD-10-CM | POA: Diagnosis not present

## 2023-11-24 DIAGNOSIS — C3411 Malignant neoplasm of upper lobe, right bronchus or lung: Secondary | ICD-10-CM

## 2023-11-24 NOTE — Assessment & Plan Note (Signed)
 He continues to see desaturations when he is on pulsed oxygen and with exertion.  Question whether he is using his oxygen as reliably as he needs to with all exertion.  Also question whether he needs to stay on continuous flow given his observed desaturations.  He does point out that his dyspnea and exertional tolerance do not really change even when he does see desaturations.  I encouraged him to wear his oxygen at 3 L/min, preferably on continuous flow, as reliably as possible with exertion.

## 2023-11-24 NOTE — Assessment & Plan Note (Signed)
 Significant COPD.  Plan to continue his current regimen of Trelegy, prednisone  5 mg daily.  Follow for any evidence of exacerbation.  Continue albuterol  as needed.

## 2023-11-24 NOTE — Assessment & Plan Note (Signed)
 Followed closely by Dr. Timmy.  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  He is on observation for now with plans for surveillance imaging.

## 2023-11-24 NOTE — Patient Instructions (Signed)
 It is great to see you today Please continue your Trelegy once daily as you have been taking it.  Rinse and gargle after using. Keep albuterol  available to use 2 puffs if needed for shortness of breath, chest tightness, wheezing. Continue prednisone  5 mg once daily. Recommend that you try to be diligent with wearing your oxygen at 3 L/min with any exertion We reviewed your PET scan today. Continue to follow with Dr. Ennever as planned. Follow Dr. Shelah in 6 months.  Please call sooner if you have any problems.

## 2023-11-24 NOTE — Progress Notes (Signed)
 Subjective:    Patient ID: John [name], male    DOB: [birth year], 63 y.o.   MRN: [id number]  COPD His past medical history is significant for asthma and COPD.  Asthma His past medical history is significant for asthma and COPD.    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  His course was complicated by pneumonitis due to Keytruda .  There is interstitial change and a cavitary lesion in the right midlung.  Has COPD and some associated restrictive lung disease due to the above.  Has been dealing with cough, sputum consistent with pneumonia, treated in mid April.  His Breztri  he has been changed to Trelegy, was having tachycardia.  He is on chronic prednisone  at 5mg .  He is due for surveillance PET scan on 5/17. He is not on his omeprazole  right now.  Today he reports that his cough is a bit better but still happening. He has some R upper chest and muscle pain. He wants script for Trelegy, has not needed any albuterol . Uses 3L/min w exertion.   ROV 11/24/2023 --John [name] is 57 history of stage IV squamous cell lung cancer of the right upper lobe, chronic right effusion.  He had a previous hospitalization for an acute right pneumothorax.  Treated with Keytruda  in the past complicated by pneumonitis.  He is on observation with Dr. Timmy.  Also w a hx RA and on pred 5mg . We have been managing him on Trelegy, prednisone  5 mg daily, oxygen at 3 L/min with exertion.  He had a PET scan 8//25 as below. He sees desats w exertion even w his O2 on at 3L/min.  He does not feel SOB at rest, does have some dyspnea after walking 141ft whether he has on his O2 or not. ? Whether pulsed flow is adequate. Never uses albuterol .   PET scan 09/19/23 reviewed by me showed no evidence of progressive findings to suggest new recurrent or metastatic disease.  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  Stable right adrenal lesion without any hypermetabolism on this scan   Review of Systems As per HPI      Objective:   Physical Exam Vitals:   11/24/23 0832 11/24/23 0833  BP:  100/70  Pulse: 83 83  Temp:  98.3 F (36.8 C)  TempSrc:  Oral  SpO2: (!) 83% (!) 89%  Weight: 241 lb 6.4 oz (109.5 kg)   Height: 5' 7 (1.702 m)    Gen: Pleasant, well-nourished, in no distress, normal affect.  O2 in place.  ENT: No lesions,  mouth clear,  oropharynx clear, no postnasal drip  Neck: No JVD, no stridor  Lungs: No use of accessory muscles, mostly clear.  No wheezes or crackles  Cardiovascular: RRR, heart sounds normal, no murmur or gallops, no peripheral edema  Musculoskeletal: No deformities  Neuro: alert, awake, non focal  Skin: Warm, no lesions or rash        Assessment & Plan:  Stage 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 (HCC) Followed closely by Dr. Timmy.  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  He is on observation for now with plans for surveillance imaging.  COPD with asthma (HCC) Significant COPD.  Plan to continue his current regimen of Trelegy, prednisone  5 mg daily.  Follow for any evidence of exacerbation.  Continue albuterol  as needed.  Chronic respiratory failure with hypoxia (HCC) He continues to see desaturations when he is on pulsed oxygen and with exertion.  Question whether he is using his oxygen as reliably as he needs to with all exertion.  Also question whether he needs to stay on continuous flow given his observed desaturations.  He does point out that his dyspnea and exertional tolerance do not really change even when he does see desaturations.  I encouraged him to wear his oxygen at 3 L/min, preferably on continuous flow, as reliably as possible with exertion.    Lamar Chris, MD,  PhD 11/24/2023, 12:20 PM [facility] [phone] or if no answer before 7:00PM call [phone] For any issues after 7:00PM please call eLink [phone]

## 2023-12-01 ENCOUNTER — Encounter: Payer: Self-pay | Admitting: *Deleted

## 2023-12-01 NOTE — Progress Notes (Signed)
 Chair massage form signed by Dr. Timmy and mailed to patient at his home address per pt.'s request.

## 2023-12-09 ENCOUNTER — Ambulatory Visit: Admitting: Podiatry

## 2023-12-09 ENCOUNTER — Encounter: Payer: Self-pay | Admitting: Podiatry

## 2023-12-09 DIAGNOSIS — B351 Tinea unguium: Secondary | ICD-10-CM | POA: Diagnosis not present

## 2023-12-09 DIAGNOSIS — M79675 Pain in left toe(s): Secondary | ICD-10-CM

## 2023-12-09 DIAGNOSIS — M79674 Pain in right toe(s): Secondary | ICD-10-CM

## 2023-12-09 DIAGNOSIS — D689 Coagulation defect, unspecified: Secondary | ICD-10-CM

## 2023-12-09 NOTE — Progress Notes (Signed)
  Subjective:  Patient ID: John [name], male    DOB: [birth year],  MRN: [id number]  Chief Complaint  Patient presents with   RFC    RFC  dark spot on 3rd L toe.  Not diabetic. Eliquis     63 y.o. male presents with the above complaint. History confirmed with patient. Patient presenting with pain related to dystrophic thickened elongated nails. Patient is unable to trim own nails related to nail dystrophy. Patient does not have a history of T2DM.  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  Objective:  Physical Exam: warm, good capillary refill, decreased pedal hair growth, pedal skin atrophic nail exam onychomycosis of the toenails, onycholysis, and dystrophic nails, left foot toenails more affected than right. DP pulses palpable, PT pulses palpable, and protective sensation absent, subjective paresthesias Left Foot:  Pain with palpation of nails due to elongation and dystrophic growth.  Left first toenail especially dystrophic and painful Right Foot: Pain with palpation of nails due to elongation and dystrophic growth.  Xerotic pedal skin improved from previous  Assessment:   1. Pain due to onychomycosis of toenails of both feet   2. Coagulation defect      Plan:  Patient was evaluated and treated and all questions answered.  #Onychomycosis with pain  -Nails palliatively debrided as below. -Educated on self-care -Anticoagulated on eliquis   Procedure: Nail Debridement Rationale: Pain Type of Debridement: manual, sharp debridement. Instrumentation: Nail nipper, rotary burr. Number of Nails: 10   Return in about 3 months (around 03/10/2024) for Routine Foot Care.         Ethan Saddler, DPM [facility] / [facility]

## 2023-12-14 ENCOUNTER — Other Ambulatory Visit: Payer: Self-pay | Admitting: Cardiology

## 2023-12-14 NOTE — Telephone Encounter (Signed)
 Prescription refill request for Eliquis  received. Indication:afib Last office visit:5/25 Scr:1.32  10/25 Age: 63 Weight:109.5  kg  Prescription refilled

## 2023-12-29 ENCOUNTER — Encounter (HOSPITAL_COMMUNITY)
Admission: RE | Admit: 2023-12-29 | Discharge: 2023-12-29 | Disposition: A | Discharge status: Home / Self Care | Source: Ambulatory Visit | Attending: Hematology & Oncology | Admitting: Hematology & Oncology

## 2023-12-29 ENCOUNTER — Ambulatory Visit: Payer: Self-pay | Admitting: Hematology & Oncology

## 2023-12-29 DIAGNOSIS — C3491 Malignant neoplasm of unspecified part of right bronchus or lung: Secondary | ICD-10-CM | POA: Insufficient documentation

## 2023-12-29 LAB — GLUCOSE, CAPILLARY: Glucose-Capillary: 95 mg/dL (ref 70–99)

## 2023-12-29 MED ORDER — FLUDEOXYGLUCOSE F - 18 (FDG) INJECTION
13.0000 | Freq: Once | INTRAVENOUS | Status: AC
  Administered 2023-12-29: 12.03 via INTRAVENOUS

## 2023-12-30 ENCOUNTER — Encounter: Payer: Self-pay | Admitting: *Deleted

## 2024-01-06 ENCOUNTER — Ambulatory Visit: Admitting: Hematology & Oncology

## 2024-01-06 ENCOUNTER — Inpatient Hospital Stay

## 2024-01-11 ENCOUNTER — Inpatient Hospital Stay: Attending: Internal Medicine

## 2024-01-11 ENCOUNTER — Inpatient Hospital Stay: Admitting: Hematology & Oncology

## 2024-01-11 ENCOUNTER — Encounter: Payer: Self-pay | Admitting: Hematology & Oncology

## 2024-01-11 VITALS — BP 97/67 | HR 63 | Temp 97.8°F | Resp 20 | Ht 71.0 in | Wt 242.4 lb

## 2024-01-11 DIAGNOSIS — Z9221 Personal history of antineoplastic chemotherapy: Secondary | ICD-10-CM | POA: Insufficient documentation

## 2024-01-11 DIAGNOSIS — C3491 Malignant neoplasm of unspecified part of right bronchus or lung: Secondary | ICD-10-CM

## 2024-01-11 DIAGNOSIS — C3411 Malignant neoplasm of upper lobe, right bronchus or lung: Secondary | ICD-10-CM | POA: Insufficient documentation

## 2024-01-11 DIAGNOSIS — C3400 Malignant neoplasm of unspecified main bronchus: Secondary | ICD-10-CM

## 2024-01-11 DIAGNOSIS — Z923 Personal history of irradiation: Secondary | ICD-10-CM | POA: Diagnosis not present

## 2024-01-11 DIAGNOSIS — Z79899 Other long term (current) drug therapy: Secondary | ICD-10-CM | POA: Insufficient documentation

## 2024-01-11 DIAGNOSIS — C779 Secondary and unspecified malignant neoplasm of lymph node, unspecified: Secondary | ICD-10-CM | POA: Insufficient documentation

## 2024-01-11 DIAGNOSIS — C7971 Secondary malignant neoplasm of right adrenal gland: Secondary | ICD-10-CM | POA: Insufficient documentation

## 2024-01-11 LAB — CBC WITH DIFFERENTIAL (CANCER CENTER ONLY)
Abs Immature Granulocytes: 0.01 K/uL (ref 0.00–0.07)
Basophils Absolute: 0 K/uL (ref 0.0–0.1)
Basophils Relative: 1 %
Eosinophils Absolute: 0.2 K/uL (ref 0.0–0.5)
Eosinophils Relative: 5 %
HCT: 39.8 % (ref 39.0–52.0)
Hemoglobin: 13.6 g/dL (ref 13.0–17.0)
Immature Granulocytes: 0 %
Lymphocytes Relative: 20 %
Lymphs Abs: 0.7 K/uL (ref 0.7–4.0)
MCH: 30.9 pg (ref 26.0–34.0)
MCHC: 34.2 g/dL (ref 30.0–36.0)
MCV: 90.5 fL (ref 80.0–100.0)
Monocytes Absolute: 0.5 K/uL (ref 0.1–1.0)
Monocytes Relative: 13 %
Neutro Abs: 2.3 K/uL (ref 1.7–7.7)
Neutrophils Relative %: 61 %
Platelet Count: 151 K/uL (ref 150–400)
RBC: 4.4 MIL/uL (ref 4.22–5.81)
RDW: 14.8 % (ref 11.5–15.5)
WBC Count: 3.7 K/uL — ABNORMAL LOW (ref 4.0–10.5)
nRBC: 0 % (ref 0.0–0.2)

## 2024-01-11 LAB — LACTATE DEHYDROGENASE: LDH: 328 U/L — ABNORMAL HIGH (ref 105–235)

## 2024-01-11 LAB — CMP (CANCER CENTER ONLY)
ALT: 12 U/L (ref 0–44)
AST: 26 U/L (ref 15–41)
Albumin: 3.9 g/dL (ref 3.5–5.0)
Alkaline Phosphatase: 91 U/L (ref 38–126)
Anion gap: 10 (ref 5–15)
BUN: 10 mg/dL (ref 8–23)
CO2: 25 mmol/L (ref 22–32)
Calcium: 8.8 mg/dL — ABNORMAL LOW (ref 8.9–10.3)
Chloride: 106 mmol/L (ref 98–111)
Creatinine: 1.26 mg/dL — ABNORMAL HIGH (ref 0.61–1.24)
GFR, Estimated: >60 mL/min (ref >60)
Glucose, Bld: 110 mg/dL — ABNORMAL HIGH (ref 70–99)
Potassium: 5.1 mmol/L (ref 3.5–5.1)
Sodium: 140 mmol/L (ref 135–145)
Total Bilirubin: 0.8 mg/dL (ref 0.0–1.2)
Total Protein: 6.9 g/dL (ref 6.5–8.1)

## 2024-01-11 LAB — TSH: TSH: 4.87 u[IU]/mL — ABNORMAL HIGH (ref 0.350–4.500)

## 2024-01-11 MED ORDER — LEVOTHYROXINE SODIUM 25 MCG PO TABS: 25.0000 ug | ORAL_TABLET | Freq: Every day | ORAL | 2 refills | Status: AC

## 2024-01-11 NOTE — Progress Notes (Signed)
 John [name] + Hematology and Oncology Follow Up Visit  John [name] [id number] 08/17/1960 63 y.o. 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 3 months --next dose in 06/2023  XRT -right adrenal gland.  2 finish on 04/04/2023 Gemzar /Navelbine  -s/p cycle 1  - start on 05/26/2023    Interim History:  John [name] is here today for follow-up.  He actually looks quite good.  I know that he will have a wonderful Thanksgiving.  He and his wife just got back from [place] .  There were up visiting his wife's parents gravesite.  He does this for every year.  I know it is quite a journey but there was enjoy.  What it is amazing is that his PET scan that he had done does not show anything that looks like obvious active cancer that is metastasizing.  Had a PET scan on 12/29/2023.  The PET scan showed everything to be very stable.  He had posttherapy changes in the right chest.  He had a stable right adrenal lesion measuring 3 x 1.9 cm.  Is so happy that he is doing well.  His only complaint is that he has popping in his neck.  I suspect this is probably arthritis.  I told him that he could certainly see a chiropractor for this.  He is eating well.  His weight is going up a little bit too much.  Hopefully he will watch what he eats.  He has had no problems with pain.  He has had no change in bowel or bladder habits.  He has had no rashes.  There is been no leg swelling.  I think his wife is having some of her own health difficulties right now.  Hopefully, she will get through this.  He does have supplemental oxygen.  I do not think he really wears at all that much.  Overall, I will  say that his performance status is by ECOG 1.   Medications:  Allergies as of 01/11/2024       Reactions   Ventolin  [albuterol ] Other (See Comments)   Patient has had episode of atrial fib following administration of albuterol  (tolerates Xopenex  well)   Lipitor [atorvastatin ] Other (See Comments)   Arthralgias    Oxycodone  Other (See Comments)   Severe Migranes   Ketamine  Anxiety   Feeling of doom        Medication List        Accurate as of January 11, 2024  8:06 AM. If you have any questions, ask your nurse or doctor.          acetaminophen  325 MG tablet Commonly known as: TYLENOL  Take 2 tablets (650 mg total) by mouth every 6 (six) hours as needed for mild pain (pain score 1-3), fever or headache.   amiodarone  200 MG tablet Commonly known as: PACERONE  Take 1 tablet (200 mg total) by mouth daily.   Eliquis  5 MG Tabs tablet Generic drug: apixaban  TAKE 1 TABLET(5 MG) BY MOUTH TWICE DAILY   ketoconazole  2 % cream Commonly known as: NIZORAL  Apply 1 Application topically daily.   methocarbamol  500 MG tablet Commonly known  as: ROBAXIN  Take 2 tablets (1,000 mg total) by mouth every 6 (six) hours as needed for muscle spasms.   metoprolol  succinate 25 MG 24 hr tablet Commonly known as: TOPROL -XL Take 0.5 tablets (12.5 mg total) by mouth daily.   PARoxetine  10 MG tablet Commonly known as: PAXIL  Take 1 tablet (10 mg total) by mouth at bedtime.   polyethylene glycol 17 g packet Commonly known as: MIRALAX  / GLYCOLAX  Take 17 g by mouth daily as needed for mild constipation.   predniSONE  5 MG tablet Commonly known as: DELTASONE  Take 1 tablet (5 mg total) by mouth daily with breakfast.   Repatha  SureClick 140 MG/ML Soaj Generic drug: Evolocumab  ADMINISTER 1 ML UNDER THE SKIN EVERY 14 DAYS   traMADol  50 MG tablet Commonly known as: ULTRAM  Take 2 tablets (100 mg total) by mouth every 6 (six) hours as needed for moderate pain (pain score 4-6).   Trelegy  Ellipta 100-62.5-25 MCG/ACT Aepb Generic drug: Fluticasone -Umeclidin-Vilant INHALE 1 PUFF INTO THE LUNGS DAILY   Vitamin D  (Ergocalciferol ) 1.25 MG (50000 UNIT) Caps capsule Commonly known as: DRISDOL  Take 1 capsule (50,000 Units total) by mouth every 7 (seven) days.        Allergies:  Allergies  Allergen Reactions   Ventolin  [Albuterol ] Other (See Comments)    Patient has had episode of atrial fib following administration of albuterol  (tolerates Xopenex  well)   Lipitor [Atorvastatin ] Other (See Comments)    Arthralgias    Oxycodone  Other (See Comments)    Severe Migranes   Ketamine  Anxiety    Feeling of doom    Past Medical History, Surgical history, Social history, and Family History were reviewed and updated.  Review of Systems: Review of Systems  Constitutional:  Positive for malaise/fatigue.  HENT: Negative.    Eyes: Negative.   Respiratory:  Positive for cough and shortness of breath.   Cardiovascular:  Positive for chest pain.  Gastrointestinal: Negative.   Genitourinary: Negative.   Musculoskeletal:  Positive for joint pain.  Skin: Negative.   Neurological:  Positive for focal weakness.  Endo/Heme/Allergies: Negative.   Psychiatric/Behavioral: Negative.       Physical Exam:  Vital signs show temperature of 97.8.  Pulse 63.  Blood pressure 97/67.  Weight is 242 pounds.     Wt Readings from Last 3 Encounters:  11/24/23 241 lb 6.4 oz (109.5 kg)  11/18/23 238 lb (108 kg)  10/07/23 237 lb (107.5 kg)    Physical Exam Vitals reviewed.  HENT:     Head: Normocephalic and atraumatic.  Eyes:     Pupils: Pupils are equal, round, and reactive to light.  Cardiovascular:     Rate and Rhythm: Normal rate and regular rhythm.     Heart sounds: Normal heart sounds.  Pulmonary:     Effort: Pulmonary effort is normal.     Breath sounds: Normal breath sounds.  Abdominal:     General: Bowel sounds are normal.     Palpations: Abdomen is soft.  Musculoskeletal:         General: No tenderness or deformity. Normal range of motion.     Cervical back: Normal range of motion.  Lymphadenopathy:     Cervical: No cervical adenopathy.  Skin:    General: Skin is warm and dry.     Findings: No erythema or rash.  Neurological:     Mental Status: He is alert and oriented to person, place, and time.  Psychiatric:        Behavior: Behavior normal.  Thought Content: Thought content normal.        Judgment: Judgment normal.     Lab Results  Component Value Date   WBC 3.7 (L) 01/11/2024   HGB 13.6 01/11/2024   HCT 39.8 01/11/2024   MCV 90.5 01/11/2024   PLT 151 01/11/2024   Lab Results  Component Value Date   FERRITIN 446 (H) 08/23/2021   IRON 189 (H) 08/26/2021   TIBC 213 (L) 08/26/2021   UIBC 24 08/26/2021   IRONPCTSAT 89 (H) 08/26/2021   Lab Results  Component Value Date   RETICCTPCT 1.9 08/23/2021   RBC 4.40 01/11/2024   No results found for: KPAFRELGTCHN, LAMBDASER, KAPLAMBRATIO No results found for: IGGSERUM, IGA, IGMSERUM No results found for: STEPHANY CARLOTA BENSON MARKEL EARLA JOANNIE DOC VICK, SPEI   Chemistry      Component Value Date/Time   NA 142 11/18/2023 1004   NA 139 04/12/2023 0937   K 4.7 11/18/2023 1004   CL 106 11/18/2023 1004   CO2 23 11/18/2023 1004   BUN 10 11/18/2023 1004   BUN 12 04/12/2023 0937   CREATININE 1.32 (H) 11/18/2023 1004      Component Value Date/Time   CALCIUM  9.2 11/18/2023 1004   ALKPHOS 92 11/18/2023 1004   AST 32 11/18/2023 1004   ALT 14 11/18/2023 1004   BILITOT 0.8 11/18/2023 1004       Impression and Plan: Mr. Sida is a very pleasant 63 yo gentleman with metastatic squamous cell carcinoma of the right lung.  The tumor had high PD-L1 score.  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  Again, it is amazing that clinically, I do not think his cancer really is progressing.  I think the PET scan  shows that this.  His quality life is doing so well right now.  He is working.  He is helping his wife out.  I know that he will enjoy the Holidays.  He really has had a tough year.  However, he has gotten through.  His faith has totally got him through everything.  I think his faith has healed him.  Will go ahead and plan to get him back in January 2026.  I do not think we have to do any scans on him probably until February or March.    Maude JONELLE Crease, MD 11/26/20258:06 AM

## 2024-02-10 ENCOUNTER — Other Ambulatory Visit: Payer: Self-pay | Admitting: Emergency Medicine

## 2024-02-16 ENCOUNTER — Encounter: Payer: Self-pay | Admitting: Hematology & Oncology

## 2024-02-16 ENCOUNTER — Encounter: Payer: Self-pay | Admitting: Internal Medicine

## 2024-02-27 ENCOUNTER — Encounter: Payer: Self-pay | Admitting: Hematology & Oncology

## 2024-02-27 ENCOUNTER — Encounter: Payer: Self-pay | Admitting: Internal Medicine

## 2024-02-28 ENCOUNTER — Encounter: Payer: Self-pay | Admitting: Hematology & Oncology

## 2024-02-28 ENCOUNTER — Encounter: Payer: Self-pay | Admitting: Internal Medicine

## 2024-03-01 ENCOUNTER — Other Ambulatory Visit: Payer: Self-pay

## 2024-03-01 ENCOUNTER — Encounter: Payer: Self-pay | Admitting: Hematology & Oncology

## 2024-03-01 ENCOUNTER — Inpatient Hospital Stay: Admitting: Hematology & Oncology

## 2024-03-01 ENCOUNTER — Inpatient Hospital Stay: Payer: Self-pay | Attending: Internal Medicine

## 2024-03-01 VITALS — BP 114/63 | HR 86 | Temp 98.0°F | Resp 19 | Ht 71.0 in | Wt 248.0 lb

## 2024-03-01 DIAGNOSIS — C7970 Secondary malignant neoplasm of unspecified adrenal gland: Secondary | ICD-10-CM | POA: Diagnosis not present

## 2024-03-01 DIAGNOSIS — C7800 Secondary malignant neoplasm of unspecified lung: Secondary | ICD-10-CM | POA: Diagnosis not present

## 2024-03-01 DIAGNOSIS — Z9981 Dependence on supplemental oxygen: Secondary | ICD-10-CM | POA: Insufficient documentation

## 2024-03-01 DIAGNOSIS — J849 Interstitial pulmonary disease, unspecified: Secondary | ICD-10-CM | POA: Diagnosis not present

## 2024-03-01 DIAGNOSIS — C779 Secondary and unspecified malignant neoplasm of lymph node, unspecified: Secondary | ICD-10-CM | POA: Diagnosis not present

## 2024-03-01 DIAGNOSIS — C3491 Malignant neoplasm of unspecified part of right bronchus or lung: Secondary | ICD-10-CM

## 2024-03-01 DIAGNOSIS — C3411 Malignant neoplasm of upper lobe, right bronchus or lung: Secondary | ICD-10-CM | POA: Diagnosis not present

## 2024-03-01 LAB — CBC WITH DIFFERENTIAL (CANCER CENTER ONLY)
Abs Immature Granulocytes: 0.01 K/uL (ref 0.00–0.07)
Basophils Absolute: 0 K/uL (ref 0.0–0.1)
Basophils Relative: 1 %
Eosinophils Absolute: 0.2 K/uL (ref 0.0–0.5)
Eosinophils Relative: 4 %
HCT: 40 % (ref 39.0–52.0)
Hemoglobin: 13.3 g/dL (ref 13.0–17.0)
Immature Granulocytes: 0 %
Lymphocytes Relative: 26 %
Lymphs Abs: 0.9 K/uL (ref 0.7–4.0)
MCH: 30.9 pg (ref 26.0–34.0)
MCHC: 33.3 g/dL (ref 30.0–36.0)
MCV: 93 fL (ref 80.0–100.0)
Monocytes Absolute: 0.5 K/uL (ref 0.1–1.0)
Monocytes Relative: 14 %
Neutro Abs: 1.9 K/uL (ref 1.7–7.7)
Neutrophils Relative %: 55 %
Platelet Count: 151 K/uL (ref 150–400)
RBC: 4.3 MIL/uL (ref 4.22–5.81)
RDW: 15 % (ref 11.5–15.5)
WBC Count: 3.5 K/uL — ABNORMAL LOW (ref 4.0–10.5)
nRBC: 0 % (ref 0.0–0.2)

## 2024-03-01 LAB — CMP (CANCER CENTER ONLY)
ALT: 22 U/L (ref 0–44)
AST: 28 U/L (ref 15–41)
Albumin: 4.1 g/dL (ref 3.5–5.0)
Alkaline Phosphatase: 98 U/L (ref 38–126)
Anion gap: 10 (ref 5–15)
BUN: 13 mg/dL (ref 8–23)
CO2: 25 mmol/L (ref 22–32)
Calcium: 8.8 mg/dL — ABNORMAL LOW (ref 8.9–10.3)
Chloride: 106 mmol/L (ref 98–111)
Creatinine: 1.29 mg/dL — ABNORMAL HIGH (ref 0.61–1.24)
GFR, Estimated: >60 mL/min
Glucose, Bld: 107 mg/dL — ABNORMAL HIGH (ref 70–99)
Potassium: 4.5 mmol/L (ref 3.5–5.1)
Sodium: 141 mmol/L (ref 135–145)
Total Bilirubin: 0.9 mg/dL (ref 0.0–1.2)
Total Protein: 6.9 g/dL (ref 6.5–8.1)

## 2024-03-01 LAB — LACTATE DEHYDROGENASE: LDH: 328 U/L — ABNORMAL HIGH (ref 105–235)

## 2024-03-01 MED ORDER — HYDROCODONE BIT-HOMATROP MBR 5-1.5 MG/5ML PO SOLN: 5.0000 mL | Freq: Four times a day (QID) | ORAL | 0 refills | Status: AC | PRN

## 2024-03-01 NOTE — Progress Notes (Signed)
 John [name] + Hematology and Oncology Follow Up Visit  John [name] [id number] 1960/12/01 64 y.o. 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 3 months --next dose in 06/2023  XRT -right adrenal gland.  2 finish on 04/04/2023 Gemzar /Navelbine  -s/p cycle 1  - start on 05/26/2023    Interim History:  John [name] is here today for follow-up.  Last time voxelotor before Thanksgiving.  He made this is the holiday season okay.  There was a little bit of trauma as he thought he lost his dog.  Thankfully, his dog showed up at a local [facility] [place] andhhe was she was okay.  He is still working.  He is working quite a lot.  He is quite busy at work.  He still wears supplemental oxygen.  He has interstitial lung disease.  This may have been from past immunotherapy.  He does not complain of any pain.  He still has a dry cough.  Hycodan seems to work well for this cough.  With Hycodan, he sleeps well and is able to have more stamina.  He has had no problems with the atrial fibrillation.  I think he sees cardiology in February.  He has a good appetite.  His weight is going up a little bit.  There is no change in bowel or bladder habits.  He has had no bleeding.  There has been no leg swelling.  Overall, I will say that his performance status is probably ECOG 1.    Medications:  Allergies as of 03/01/2024       Reactions   Ketamine  Anxiety   Feeling of doom   Oxycodone  Other (See Comments)   Severe Migranes   Ventolin  [albuterol ] Other (See Comments)   Patient has had episode of atrial fib following administration of albuterol  (tolerates Xopenex  well)   Lipitor [atorvastatin ]  Other (See Comments)   Arthralgias         Medication List        Accurate as of March 01, 2024  8:48 AM. If you have any questions, ask your nurse or doctor.          acetaminophen  325 MG tablet Commonly known as: TYLENOL  Take 2 tablets (650 mg total) by mouth every 6 (six) hours as needed for mild pain (pain score 1-3), fever or headache.   amiodarone  200 MG tablet Commonly known as: PACERONE  Take 1 tablet (200 mg total) by mouth daily.   Eliquis  5 MG Tabs tablet Generic drug: apixaban  TAKE 1 TABLET(5 MG) BY MOUTH TWICE DAILY   HYDROcodone  bit-homatropine 5-1.5 MG/5ML syrup Commonly known as: HYCODAN Take 5 mLs by mouth every 6 (six) hours as needed for cough. Started by: Maude Crease, MD   ketoconazole  2 % cream Commonly known as: NIZORAL  Apply 1 Application topically daily.   levothyroxine  25 MCG tablet Commonly known as: Synthroid  Take 1 tablet (25 mcg total) by mouth daily before breakfast.   methocarbamol  500 MG tablet Commonly known as: ROBAXIN  Take 2 tablets (1,000 mg total) by mouth every 6 (six) hours as needed for muscle spasms.   metoprolol  succinate 25 MG 24 hr tablet  Commonly known as: TOPROL -XL Take 0.5 tablets (12.5 mg total) by mouth daily.   PARoxetine  10 MG tablet Commonly known as: PAXIL  Take 1 tablet (10 mg total) by mouth at bedtime.   polyethylene glycol 17 g packet Commonly known as: MIRALAX  / GLYCOLAX  Take 17 g by mouth daily as needed for mild constipation.   predniSONE  5 MG tablet Commonly known as: DELTASONE  Take 1 tablet (5 mg total) by mouth daily with breakfast.   Repatha  SureClick 140 MG/ML Soaj Generic drug: Evolocumab  ADMINISTER 1 ML UNDER THE SKIN EVERY 14 DAYS   traMADol  50 MG tablet Commonly known as: ULTRAM  Take 2 tablets (100 mg total) by mouth every 6 (six) hours as needed for moderate pain (pain score 4-6).   Trelegy Ellipta  100-62.5-25 MCG/ACT Aepb Generic drug: Fluticasone -Umeclidin-Vilant INHALE 1  PUFF INTO THE LUNGS DAILY   Vitamin D  (Ergocalciferol ) 1.25 MG (50000 UNIT) Caps capsule Commonly known as: DRISDOL  Take 1 capsule (50,000 Units total) by mouth every 7 (seven) days.        Allergies:  Allergies  Allergen Reactions   Ketamine  Anxiety    Feeling of doom   Oxycodone  Other (See Comments)    Severe Migranes   Ventolin  [Albuterol ] Other (See Comments)    Patient has had episode of atrial fib following administration of albuterol  (tolerates Xopenex  well)   Lipitor [Atorvastatin ] Other (See Comments)    Arthralgias     Past Medical History, Surgical history, Social history, and Family History were reviewed and updated.  Review of Systems: Review of Systems  Constitutional:  Positive for malaise/fatigue.  HENT: Negative.    Eyes: Negative.   Respiratory:  Positive for cough and shortness of breath.   Cardiovascular:  Positive for chest pain.  Gastrointestinal: Negative.   Genitourinary: Negative.   Musculoskeletal:  Positive for joint pain.  Skin: Negative.   Neurological:  Positive for focal weakness.  Endo/Heme/Allergies: Negative.   Psychiatric/Behavioral: Negative.       Physical Exam:  Vital signs show temperature of 98.  Pulse 86.  Blood pressure 114/63.  Weight is 248 pounds.    Wt Readings from Last 3 Encounters:  03/01/24 248 lb (112.5 kg)  01/11/24 242 lb 6.4 oz (110 kg)  11/24/23 241 lb 6.4 oz (109.5 kg)    Physical Exam Vitals reviewed.  HENT:     Head: Normocephalic and atraumatic.  Eyes:     Pupils: Pupils are equal, round, and reactive to light.  Cardiovascular:     Rate and Rhythm: Normal rate and regular rhythm.     Heart sounds: Normal heart sounds.  Pulmonary:     Effort: Pulmonary effort is normal.     Breath sounds: Normal breath sounds.  Abdominal:     General: Bowel sounds are normal.     Palpations: Abdomen is soft.  Musculoskeletal:        General: No tenderness or deformity. Normal range of motion.     Cervical  back: Normal range of motion.  Lymphadenopathy:     Cervical: No cervical adenopathy.  Skin:    General: Skin is warm and dry.     Findings: No erythema or rash.  Neurological:     Mental Status: He is alert and oriented to person, place, and time.  Psychiatric:        Behavior: Behavior normal.        Thought Content: Thought content normal.        Judgment: Judgment normal.     Lab  Results  Component Value Date   WBC 3.5 (L) 03/01/2024   HGB 13.3 03/01/2024   HCT 40.0 03/01/2024   MCV 93.0 03/01/2024   PLT 151 03/01/2024   Lab Results  Component Value Date   FERRITIN 446 (H) 08/23/2021   IRON 189 (H) 08/26/2021   TIBC 213 (L) 08/26/2021   UIBC 24 08/26/2021   IRONPCTSAT 89 (H) 08/26/2021   Lab Results  Component Value Date   RETICCTPCT 1.9 08/23/2021   RBC 4.30 03/01/2024   No results found for: KPAFRELGTCHN, LAMBDASER, KAPLAMBRATIO No results found for: IGGSERUM, IGA, IGMSERUM No results found for: STEPHANY CARLOTA BENSON MARKEL EARLA JOANNIE DOC VICK, SPEI   Chemistry      Component Value Date/Time   NA 140 01/11/2024 0741   NA 139 04/12/2023 0937   K 5.1 01/11/2024 0741   CL 106 01/11/2024 0741   CO2 25 01/11/2024 0741   BUN 10 01/11/2024 0741   BUN 12 04/12/2023 0937   CREATININE 1.26 (H) 01/11/2024 0741      Component Value Date/Time   CALCIUM  8.8 (L) 01/11/2024 0741   ALKPHOS 91 01/11/2024 0741   AST 26 01/11/2024 0741   ALT 12 01/11/2024 0741   BILITOT 0.8 01/11/2024 0741       Impression and Plan: Mr. Volkert is a very pleasant 64 yo gentleman with metastatic squamous cell carcinoma of the right lung.  The tumor had high PD-L1 score.  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  Again, it is amazing that clinically, I do not think his cancer really is progressing.  We will get another PET scan on him.  We will get this in February.  I am just happy that his  quality of life is doing so well.  I am also thankful that his dog was found.  We will plan to get him back in 6 weeks.  We will do a PET scan on him in 4 weeks.     Maude JONELLE Crease, MD 1/15/20268:48 AM

## 2024-03-09 ENCOUNTER — Ambulatory Visit: Admitting: Podiatry

## 2024-03-09 ENCOUNTER — Encounter: Payer: Self-pay | Admitting: Podiatry

## 2024-03-09 DIAGNOSIS — M79675 Pain in left toe(s): Secondary | ICD-10-CM | POA: Diagnosis not present

## 2024-03-09 DIAGNOSIS — M79674 Pain in right toe(s): Secondary | ICD-10-CM

## 2024-03-09 DIAGNOSIS — B351 Tinea unguium: Secondary | ICD-10-CM | POA: Diagnosis not present

## 2024-03-09 DIAGNOSIS — D689 Coagulation defect, unspecified: Secondary | ICD-10-CM

## 2024-03-09 NOTE — Progress Notes (Signed)
"  °  Subjective:  Patient ID: John [name], male    DOB: [birth year],  MRN: [id number]  Chief Complaint  Patient presents with   [facility]    [facility] Nail trim. Not diabetic. Eliquis .    64 y.o. male presents with the above complaint. History confirmed with patient. Patient presenting with pain related to dystrophic thickened elongated nails. Patient is unable to trim own nails related to nail dystrophy. Patient does not have a history of T2DM.  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  He is currently awaiting next PET scan in February.  Objective:  Physical Exam: warm, good capillary refill, decreased pedal hair growth, pedal skin atrophic nail exam onychomycosis of the toenails, onycholysis, and dystrophic nails, left foot toenails more affected than right. DP pulses palpable, PT pulses palpable, and protective sensation absent, subjective paresthesias Left Foot:  Pain with palpation of nails due to elongation and dystrophic growth.  Left first toenail especially dystrophic and painful Right Foot: Pain with palpation of nails due to elongation and dystrophic growth.  Xerotic pedal skin improved from previous  Assessment:   1. Pain due to onychomycosis of toenails of both feet   2. Coagulation defect      Plan:  Patient was evaluated and treated and all questions answered.  #Onychomycosis with pain  -Nails palliatively debrided as below. -Educated on self-care - Anticoagulated on Eliquis   Procedure: Nail Debridement Rationale: Pain Type of Debridement: manual, sharp debridement. Instrumentation: Nail nipper, rotary burr. Number of Nails: 10    Return in about 3 months (around 06/07/2024) for Routine Foot Care.         Ethan Saddler, DPM [facility] / [facility]                    "

## 2024-03-29 ENCOUNTER — Encounter (HOSPITAL_COMMUNITY)

## 2024-04-02 ENCOUNTER — Ambulatory Visit: Admitting: Student

## 2024-04-12 ENCOUNTER — Inpatient Hospital Stay: Attending: Internal Medicine

## 2024-04-12 ENCOUNTER — Inpatient Hospital Stay: Admitting: Hematology & Oncology

## 2024-06-08 ENCOUNTER — Ambulatory Visit: Admitting: Podiatry
